# Patient Record
Sex: Male | Born: 1940 | Race: White | Hispanic: Yes | Marital: Married | Smoking: Never smoker
Health system: Southern US, Community
[De-identification: ages and names within clinical notes are randomized; demographics above are authoritative.]

## PROBLEM LIST (undated history)

## (undated) DIAGNOSIS — Z95810 Presence of automatic (implantable) cardiac defibrillator: Secondary | ICD-10-CM

## (undated) DIAGNOSIS — Z95811 Presence of heart assist device: Secondary | ICD-10-CM

## (undated) DIAGNOSIS — I255 Ischemic cardiomyopathy: Secondary | ICD-10-CM

## (undated) DIAGNOSIS — E7800 Pure hypercholesterolemia, unspecified: Secondary | ICD-10-CM

## (undated) DIAGNOSIS — Z950 Presence of cardiac pacemaker: Secondary | ICD-10-CM

## (undated) DIAGNOSIS — I509 Heart failure, unspecified: Secondary | ICD-10-CM

## (undated) DIAGNOSIS — M1990 Unspecified osteoarthritis, unspecified site: Secondary | ICD-10-CM

## (undated) DIAGNOSIS — I10 Essential (primary) hypertension: Secondary | ICD-10-CM

## (undated) DIAGNOSIS — I2119 ST elevation (STEMI) myocardial infarction involving other coronary artery of inferior wall: Secondary | ICD-10-CM

## (undated) DIAGNOSIS — E039 Hypothyroidism, unspecified: Secondary | ICD-10-CM

## (undated) DIAGNOSIS — I2510 Atherosclerotic heart disease of native coronary artery without angina pectoris: Secondary | ICD-10-CM

## (undated) DIAGNOSIS — F419 Anxiety disorder, unspecified: Secondary | ICD-10-CM

## (undated) DIAGNOSIS — I493 Ventricular premature depolarization: Secondary | ICD-10-CM

## (undated) DIAGNOSIS — K769 Liver disease, unspecified: Secondary | ICD-10-CM

## (undated) DIAGNOSIS — R0602 Shortness of breath: Secondary | ICD-10-CM

## (undated) DIAGNOSIS — I4510 Unspecified right bundle-branch block: Secondary | ICD-10-CM

## (undated) HISTORY — DX: Atherosclerotic heart disease of native coronary artery without angina pectoris: I25.10

## (undated) HISTORY — DX: Pure hypercholesterolemia, unspecified: E78.00

## (undated) HISTORY — PX: LEFT VENTRICULAR ASSIST DEVICE: SHX2679

## (undated) HISTORY — DX: Unspecified osteoarthritis, unspecified site: M19.90

## (undated) HISTORY — DX: Presence of automatic (implantable) cardiac defibrillator: Z95.810

## (undated) HISTORY — DX: Liver disease, unspecified: K76.9

## (undated) HISTORY — DX: Ischemic cardiomyopathy: I25.5

## (undated) HISTORY — PX: OTHER SURGICAL HISTORY: SHX169

---

## 2002-08-31 HISTORY — PX: CORONARY ANGIOPLASTY WITH STENT PLACEMENT: SHX49

## 2002-11-03 ENCOUNTER — Emergency Department (HOSPITAL_COMMUNITY): Admission: EM | Admit: 2002-11-03 | Discharge: 2002-11-03 | Payer: Self-pay

## 2002-11-03 ENCOUNTER — Ambulatory Visit (HOSPITAL_BASED_OUTPATIENT_CLINIC_OR_DEPARTMENT_OTHER): Admission: RE | Admit: 2002-11-03 | Discharge: 2002-11-03 | Payer: Self-pay | Admitting: Orthopedic Surgery

## 2002-11-20 ENCOUNTER — Ambulatory Visit (HOSPITAL_COMMUNITY): Admission: RE | Admit: 2002-11-20 | Discharge: 2002-11-21 | Payer: Self-pay | Admitting: Cardiology

## 2002-12-30 HISTORY — PX: KNEE ARTHROSCOPY W/ PARTIAL MEDIAL MENISCECTOMY: SHX1882

## 2003-07-10 ENCOUNTER — Ambulatory Visit (HOSPITAL_COMMUNITY): Admission: RE | Admit: 2003-07-10 | Discharge: 2003-07-10 | Payer: Self-pay | Admitting: Cardiology

## 2004-07-05 ENCOUNTER — Emergency Department (HOSPITAL_COMMUNITY): Admission: EM | Admit: 2004-07-05 | Discharge: 2004-07-05 | Payer: Self-pay | Admitting: Emergency Medicine

## 2007-09-01 HISTORY — PX: INSERT / REPLACE / REMOVE PACEMAKER: SUR710

## 2007-09-01 HISTORY — PX: OTHER SURGICAL HISTORY: SHX169

## 2007-10-10 ENCOUNTER — Ambulatory Visit: Payer: Self-pay | Admitting: Cardiology

## 2007-10-10 ENCOUNTER — Inpatient Hospital Stay (HOSPITAL_COMMUNITY): Admission: EM | Admit: 2007-10-10 | Discharge: 2007-10-14 | Payer: Self-pay | Admitting: Emergency Medicine

## 2007-10-12 ENCOUNTER — Encounter: Payer: Self-pay | Admitting: Cardiovascular Disease

## 2007-11-03 ENCOUNTER — Encounter (HOSPITAL_COMMUNITY): Admission: RE | Admit: 2007-11-03 | Discharge: 2008-02-01 | Payer: Self-pay | Admitting: Cardiology

## 2008-02-02 ENCOUNTER — Encounter (HOSPITAL_COMMUNITY): Admission: RE | Admit: 2008-02-02 | Discharge: 2008-02-10 | Payer: Self-pay | Admitting: Cardiology

## 2008-06-28 ENCOUNTER — Encounter: Admission: RE | Admit: 2008-06-28 | Discharge: 2008-06-28 | Payer: Self-pay | Admitting: Cardiology

## 2008-07-01 HISTORY — PX: CORONARY ANGIOPLASTY WITH STENT PLACEMENT: SHX49

## 2008-07-03 ENCOUNTER — Inpatient Hospital Stay (HOSPITAL_COMMUNITY): Admission: RE | Admit: 2008-07-03 | Discharge: 2008-07-04 | Payer: Self-pay | Admitting: Cardiology

## 2009-11-11 LAB — ICD DEVICE OBSERVATION

## 2010-02-07 ENCOUNTER — Encounter: Admission: RE | Admit: 2010-02-07 | Discharge: 2010-02-07 | Payer: Self-pay | Admitting: Orthopedic Surgery

## 2010-05-19 ENCOUNTER — Ambulatory Visit: Payer: Self-pay | Admitting: Cardiology

## 2010-05-19 LAB — LIPID PANEL
Cholesterol: 111 mg/dL (ref 0–200)
HDL: 45 mg/dL (ref 35–70)
LDL Cholesterol: 50 mg/dL
Triglycerides: 78 mg/dL (ref 40–160)

## 2010-05-19 LAB — BASIC METABOLIC PANEL: BUN: 21 mg/dL (ref 4–21)

## 2010-06-07 ENCOUNTER — Encounter: Payer: Self-pay | Admitting: Internal Medicine

## 2010-07-04 ENCOUNTER — Encounter (INDEPENDENT_AMBULATORY_CARE_PROVIDER_SITE_OTHER): Payer: Self-pay | Admitting: *Deleted

## 2010-09-24 ENCOUNTER — Encounter
Admission: RE | Admit: 2010-09-24 | Discharge: 2010-09-24 | Payer: Self-pay | Source: Home / Self Care | Attending: Neurosurgery | Admitting: Neurosurgery

## 2010-09-30 ENCOUNTER — Encounter: Payer: Self-pay | Admitting: Cardiology

## 2010-09-30 DIAGNOSIS — Z95810 Presence of automatic (implantable) cardiac defibrillator: Secondary | ICD-10-CM | POA: Insufficient documentation

## 2010-09-30 DIAGNOSIS — I255 Ischemic cardiomyopathy: Secondary | ICD-10-CM | POA: Insufficient documentation

## 2010-09-30 DIAGNOSIS — M1990 Unspecified osteoarthritis, unspecified site: Secondary | ICD-10-CM | POA: Insufficient documentation

## 2010-09-30 DIAGNOSIS — I2129 ST elevation (STEMI) myocardial infarction involving other sites: Secondary | ICD-10-CM | POA: Insufficient documentation

## 2010-09-30 DIAGNOSIS — I2510 Atherosclerotic heart disease of native coronary artery without angina pectoris: Secondary | ICD-10-CM | POA: Insufficient documentation

## 2010-09-30 DIAGNOSIS — E7800 Pure hypercholesterolemia, unspecified: Secondary | ICD-10-CM | POA: Insufficient documentation

## 2010-10-02 NOTE — Cardiovascular Report (Signed)
Summary: Office Visit Remote   Office Visit Remote   Imported By: Sallee Provencal 07/08/2010 15:41:10  _____________________________________________________________________  External Attachment:    Type:   Image     Comment:   External Document

## 2010-10-02 NOTE — Letter (Signed)
Summary: Remote Device Check  [place], [place]  [id number] [place]   [place], [place] [address]   Phone: [phone]  Fax: [phone]     July 04, 2010 MRN: [id number]   Johnny Oneill [place], [place]  [address]   Dear Mr. DURR,   Your remote transmission was recieved and reviewed by your physician.  All diagnostics were within normal limits for you.  _____Your next transmission is scheduled for:                       .  Please transmit at any time this day.  If you have a wireless device your transmission will be sent automatically.  _____X_Your next office visit is scheduled [id number] with Dr. Rayann Heman.                                 Sincerely,  Alma Friendly, LPN

## 2010-10-07 ENCOUNTER — Encounter (INDEPENDENT_AMBULATORY_CARE_PROVIDER_SITE_OTHER): Payer: Self-pay | Admitting: *Deleted

## 2010-10-16 NOTE — Letter (Signed)
Summary: Appointment - Reminder [place], [place]  [address] [place]   [place], [place] [address]   Phone: [phone]  Fax: [phone]     October 07, 2010 MRN: [id number]   [address] [place], [place]  [address]   Dear Mr. DORST,  [facility] records indicate that it is past time to schedule a follow-up appointment.Dr.Allred/Jordan recommended that you follow up with [place] in January. It is very important that we reach you to schedule this appointment as we are taking over all device checks for Dr. Martinique. We look forward to participating in your health care needs. Please contact us at the number listed above at your earliest convenience to schedule your appointment.  If you are unable to make an appointment at this time, give [place] a call so we can update our records.     Sincerely,   Public relations account executive

## 2010-11-12 ENCOUNTER — Encounter: Payer: Self-pay | Admitting: Internal Medicine

## 2010-11-12 ENCOUNTER — Encounter (INDEPENDENT_AMBULATORY_CARE_PROVIDER_SITE_OTHER): Payer: Medicare Other | Admitting: Internal Medicine

## 2010-11-12 DIAGNOSIS — I2589 Other forms of chronic ischemic heart disease: Secondary | ICD-10-CM

## 2010-11-12 DIAGNOSIS — I5022 Chronic systolic (congestive) heart failure: Secondary | ICD-10-CM

## 2010-11-12 DIAGNOSIS — I10 Essential (primary) hypertension: Secondary | ICD-10-CM | POA: Insufficient documentation

## 2010-11-18 NOTE — Assessment & Plan Note (Signed)
Summary: phys pacer ck/mt   Visit Type:  Initial Consult Referring Provider:  Dr Martinique Primary Provider:  Lavone Orn  CC:  ICD.  History of Present Illness: Johnny Oneill is a pleasant 70 yo WM with a h/o CAD, ischemic CM, and ICD implantation who presents today to establish care in the EP device clinic.  His ICD was implanted by Dr Leonia Reeves in 2009 for primary prevention of sudden death.  He reports doing very well since his ICD was implanted.  He denies ICD shocks.  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  He denies symptoms of palpitations, chest pain, shortness of breath, orthopnea, PND, lower extremity edema, dizziness, presyncope, syncope, or neurologic sequela. The patient is tolerating medications without difficulties and is otherwise without complaint today.   Current Medications (verified): 1)  Crestor 20 Mg Tabs (Rosuvastatin Calcium) .... Take One Tablet By Mouth Daily. 2)  Carvedilol 12.5 Mg Tabs (Carvedilol) .... Take One Tablet By Mouth Twice A Day 3)  Plavix 75 Mg Tabs (Clopidogrel Bisulfate) .... Take One Tablet By Mouth Daily 4)  Zetia 10 Mg Tabs (Ezetimibe) .... Take One Tablet By Mouth Daily. 5)  Aspirin Ec 325 Mg Tbec (Aspirin) .... Take One Tablet By Mouth Daily 6)  Synthroid 175 Mcg Tabs (Levothyroxine Sodium) .... Once Daily  Allergies (verified): No Known Drug Allergies  Past History:  Past Medical History: Reviewed history from 11/11/2010 and no changes required. CAD Myocardial Infarction Dyslipidemia Ischemic Cardiomyopathy Osteoarthritis Hypercholesterolemia  Past Surgical History: ICD placement by Dr Leonia Reeves (MDT) 2009 Cardiac Catheterization  Family History: Reviewed history from 11/11/2010 and no changes required. Father: Deceased age 50 - congestive heart failure Mother: had coronary bypass surgery at age 67  Social History: Reviewed history from 11/11/2010 and no changes required. 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  Alcohol Use - no Drug Use - no  Review of Systems       All systems are reviewed and negative except as listed in the HPI.   Vital Signs:  Patient profile:   70 year old male Height:      67 inches Weight:      170 pounds BMI:     26.72 Pulse rate:   64 / minute BP sitting:   140 / 80  (left arm)  Vitals Entered By: Margaretmary Bayley CMA (November 12, 2010 11:09 AM)  Physical Exam  General:  Well developed, well nourished, in no acute distress. Head:  normocephalic and atraumatic Eyes:  PERRLA/EOM intact; conjunctiva and lids normal. Mouth:  Teeth, gums and palate normal. Oral mucosa normal. Neck:  Neck supple, no JVD. No masses, thyromegaly or abnormal cervical nodes. Chest Wall:  ICD pocket is well healed Lungs:  Clear bilaterally to auscultation and percussion. Heart:  RRR, no m/r/g Abdomen:  Bowel sounds positive; abdomen soft and non-tender without masses, organomegaly, or hernias noted. No hepatosplenomegaly. Msk:  Back normal, normal gait. Muscle strength and tone normal. Extremities:  No clubbing or cyanosis. Neurologic:  Alert and oriented x 3. Skin:  Intact without lesions or rashes. Psych:  Normal affect.   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  I therefore repogrammed his device today to a single VF zone at 200 bpm. no other changes today  Problem # 2:  ISCHEMIC CARDIOMYOPATHY (ICD-414.8) no ischemic symptoms no changes today  Problem # 3:  ESSENTIAL HYPERTENSION, BENIGN (ICD-401.1) stable no changes

## 2010-11-21 ENCOUNTER — Encounter: Payer: Self-pay | Admitting: Cardiology

## 2010-11-21 ENCOUNTER — Ambulatory Visit (INDEPENDENT_AMBULATORY_CARE_PROVIDER_SITE_OTHER): Payer: Medicare Other | Admitting: Cardiology

## 2010-11-21 DIAGNOSIS — E7800 Pure hypercholesterolemia, unspecified: Secondary | ICD-10-CM

## 2010-11-21 DIAGNOSIS — I10 Essential (primary) hypertension: Secondary | ICD-10-CM

## 2010-11-21 DIAGNOSIS — I2510 Atherosclerotic heart disease of native coronary artery without angina pectoris: Secondary | ICD-10-CM

## 2010-11-21 DIAGNOSIS — I2589 Other forms of chronic ischemic heart disease: Secondary | ICD-10-CM

## 2010-11-21 NOTE — Assessment & Plan Note (Signed)
Continue current therapy with Coreg. Continue sodium restriction. He is intolerant of angiotensin receptor blockers and ACE inhibitors because of hypokalemia and hypotension. Last ejection fraction was 37% by nuclear study in November of 2010.

## 2010-11-21 NOTE — Assessment & Plan Note (Signed)
No significant anginal symptoms. Will continue on his current therapy with beta blockers and aspirin and Plavix. Anticipate followup stress Cardiolite study next fall.

## 2010-11-21 NOTE — Assessment & Plan Note (Signed)
Continue current antihypertensive therapy. Continue sodium restriction.

## 2010-11-21 NOTE — Assessment & Plan Note (Signed)
Lipid levels well controlled on combination of Crestor and Zetia. Continue heart healthy diet.

## 2010-11-21 NOTE — Progress Notes (Signed)
HPI Johnny Oneill  is seen today for followup of coronary disease. He has been doing very well. He did suffer a pulled gluteal muscle in January which had limited his activity for all of January. He states he also had some spinal stenosis. 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 He does note some mild shortness of breath that he has to run a lot. He reports he had an ICD check in March and that everything was good. No Known Allergies  Current Outpatient Prescriptions on File Prior to Visit  Medication Sig Dispense Refill  . aspirin 325 MG tablet Take 325 mg by mouth daily.        . carvedilol (COREG) 12.5 MG tablet Take 12.5 mg by mouth 2 (two) times daily with meals.        . clopidogrel (PLAVIX) 75 MG tablet Take 75 mg by mouth daily.        [name] ezetimibe (ZETIA) 10 MG tablet Take 10 mg by mouth daily.        [name] levothyroxine (SYNTHROID, LEVOTHROID) 175 MCG tablet Take 175 mcg by mouth daily.        . nitroGLYCERIN (NITROSTAT) 0.4 MG SL tablet Place 0.4 mg under the tongue every 5 (five) minutes as needed.        . rosuvastatin (CRESTOR) 20 MG tablet Take 20 mg by mouth daily.          Past Medical History  Diagnosis Date  . Ischemic cardiomyopathy   . CAD (coronary artery disease)   . Myocardial infarction of lateral wall   . Hypercholesteremia   . 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 CAD (coronary artery disease)   . Osteoarthritis     Past Surgical History  Procedure Date  . Coronary angioplasty   . Knee arthroscopy     X2  . Defibrillator     Family History  Problem Relation Age of Onset  . Heart failure Father   . Stroke Father   . Coronary artery disease Mother   . Heart disease Mother   . Hyperlipidemia Sister     History   Social History  . Marital Status: Married    Spouse Name: N/A    Number of Children: N/A  . Years of Education: N/A   Occupational History  . Not on file.   Social History  Main Topics  . Smoking status: Never Smoker   . Smokeless tobacco: Not on file  . Alcohol Use: Yes     occassionlly  . Drug Use: No  . Sexually Active: Not on file   Other Topics Concern  . Not on file   Social History Narrative  . No narrative on file    ROS The patient denies any heat or cold intolerance.  No weight gain or weight loss.  The patient denies headaches or blurry vision.  There is no cough or sputum production.  The patient denies dizziness.  There is no hematuria or hematochezia.  The patient denies any muscle aches or arthritis.  The patient denies any rash.  The patient denies frequent falling or instability.  There is no history of depression or anxiety.  All other systems were reviewed and are negative. PHYSICAL EXAM BP 148/90  Pulse 72  Ht 5\' 7"  (1.702 m)  Wt 170 lb 3.2 oz (77.202 kg)  BMI 26.66 kg/m2 The patient is alert and oriented x 3.  The  mood and affect are normal.  The skin is warm and dry.  Color is normal.  The HEENT exam reveals that the sclera are nonicteric.  The mucous membranes are moist.  The carotids are 2+ without bruits.  There is no thyromegaly.  There is no JVD.  The lungs are clear.  The chest wall is non tender.  The heart exam reveals a regular rate with a normal S1 and S2.  There are no murmurs, gallops, or rubs.  The PMI is not displaced.   Abdominal exam reveals good bowel sounds.  There is no guarding or rebound.  There is no hepatosplenomegaly or tenderness.  There are no masses.  Exam of the legs reveal no clubbing, cyanosis, or edema.  The legs are without rashes.  The distal pulses are intact.  Cranial nerves II - XII are intact.  Motor and sensory functions are intact.  The gait is normal. ASSESSMENT AND PLAN

## 2010-11-26 ENCOUNTER — Inpatient Hospital Stay (INDEPENDENT_AMBULATORY_CARE_PROVIDER_SITE_OTHER)
Admission: RE | Admit: 2010-11-26 | Discharge: 2010-11-26 | Disposition: A | Discharge status: Home / Self Care | Payer: Medicare Other | Source: Ambulatory Visit | Attending: Emergency Medicine | Admitting: Emergency Medicine

## 2010-11-26 DIAGNOSIS — S0990XA Unspecified injury of head, initial encounter: Secondary | ICD-10-CM

## 2010-11-26 DIAGNOSIS — S0180XA Unspecified open wound of other part of head, initial encounter: Secondary | ICD-10-CM

## 2010-11-27 NOTE — Cardiovascular Report (Signed)
Summary: Office Visit   Office Visit   Imported By: Sallee Provencal 11/21/2010 14:37:00  _____________________________________________________________________  External Attachment:    Type:   Image     Comment:   External Document

## 2010-12-03 ENCOUNTER — Inpatient Hospital Stay (INDEPENDENT_AMBULATORY_CARE_PROVIDER_SITE_OTHER)
Admission: RE | Admit: 2010-12-03 | Discharge: 2010-12-03 | Disposition: A | Discharge status: Home / Self Care | Payer: Medicare Other | Source: Ambulatory Visit

## 2010-12-03 DIAGNOSIS — Z4802 Encounter for removal of sutures: Secondary | ICD-10-CM

## 2010-12-09 ENCOUNTER — Other Ambulatory Visit: Payer: Self-pay | Admitting: Cardiology

## 2010-12-09 DIAGNOSIS — I2510 Atherosclerotic heart disease of native coronary artery without angina pectoris: Secondary | ICD-10-CM

## 2010-12-09 MED ORDER — CLOPIDOGREL BISULFATE 75 MG PO TABS: 75.0000 mg | ORAL_TABLET | Freq: Every day | ORAL | Status: DC

## 2010-12-09 NOTE — Telephone Encounter (Signed)
escribe medication per fax request  

## 2010-12-09 NOTE — Telephone Encounter (Signed)
Needs a Plavix refill. Please call the pharmacy on its direct line at: [phone]

## 2010-12-15 ENCOUNTER — Other Ambulatory Visit: Payer: Self-pay | Admitting: *Deleted

## 2010-12-15 DIAGNOSIS — I2510 Atherosclerotic heart disease of native coronary artery without angina pectoris: Secondary | ICD-10-CM

## 2010-12-15 MED ORDER — CLOPIDOGREL BISULFATE 75 MG PO TABS: 75.0000 mg | ORAL_TABLET | Freq: Every day | ORAL | Status: DC

## 2010-12-15 NOTE — Telephone Encounter (Signed)
Called for 10 day supply for Plavix. [facility] won't mail until Sat. escribed to [facility]

## 2011-01-13 NOTE — Discharge Summary (Signed)
NAME:  Johnny Oneill, Johnny Oneill NO.:  [id number]   MEDICAL RECORD NO.:  [id number]          PATIENT TYPE:  INP   LOCATION:  4702                         FACILITY:  [facility]   PHYSICIAN:  Peter M. Martinique, M.D.  DATE OF BIRTH:  29-Dec-1940   DATE OF ADMISSION:  10/10/2007  DATE OF DISCHARGE:  10/14/2007                               DISCHARGE SUMMARY   HISTORY OF PRESENT ILLNESS:  Mr. Coriell was a 70 year old white male with  known history of coronary disease.  He has chronic total occlusion of  his right coronary artery and first diagonal branches.  He underwent  intracoronary stenting of the left circumflex coronary in 2004.  He has  a history of moderate left ventricular dysfunction.  The patient  presented on the morning of admission with acute onset of severe  substernal chest pain beginning in his back and then radiating to his  anterior chest associated with nausea, vomiting and syncope at home.  EMS was called and ECG showed acute ST elevation, myocardial infarction  in inferior leads.  The patient was admitted for acute management.  For  details of his past medical history, social history, family history and  physical exam please see admission history and physical.   LABORATORY DATA:  Initial ECG showed normal sinus rhythm with a right  bundle branch block.  There is ST elevation in leads 2, 3 and aVF with  deep reciprocal ST depression in leads V1 through V4 consistent with ST  elevation myocardial infarction.  Chest x-ray showed no active disease.  Hemoglobin was 15.7, hematocrit 46.4, white count 11,200, platelets  197,000.  Sodium 132, potassium 4.1, chloride 102, CO2 25, BUN 19,  creatinine 1.26, glucose is 138, AST was 585, ALT 123, alk phos was 64,  total bili 1.1, albumin 3.5, cholesterol was 110, LDL 49, HDL 37,  triglycerides 121.  Initial CK was 7654 with an MB of 573.6.  Subsequently it has gone to 5890 with 488.5 MB and then 3976 with 350.7  MB.  Troponins  were all greater than 100.  Calcium level was normal.  TSH was 4.012, free T4 was 1.36.  Glycosylated hemoglobin was 5.7.  BNP  level was 84.   HOSPITAL COURSE:  The patient was taken emergently to the cardiac  catheterization laboratory.  He was loaded with Plavix.  Cardiac  catheterization demonstrated 30-40% proximal stenosis in the LAD.  The  left circumflex coronary was occluded proximally at the site of the  previous stent.  The right coronary was also occluded from previous  cardiac catheterizations.  The first diagonal branch was occluded.  The  left ventricular angiography showed moderate to severe left ventricular  dysfunction with ejection fraction approximately 35% with inferobasal  akinesia.  The patient subsequently underwent emergent intervention of  the left circumflex coronary.  This area was restented covering the  prior stent as well as further distally with a 3.0 x 28 mm Promus drug  eluting stent.  An excellent angiographic result was obtained.  There  was some distal embolization of clot into the distal vessel.  The  patient did have nonsustained ventricular tachycardia.  Subsequently,  however, his chest pain did resolve.  His ECG showed resolution of ST  elevation.  He continued to have right bundle branch block and he did  develop Q-waves inferiorly.  The patient was subsequently treated with  beta-blocker therapy with Coreg 6.25 mg b.i.d.  He was maintained on  aspirin and Plavix as well as statin and Zetia.  He was started on a low  dose of angiotensin receptor blocker.  The patient had previously had  intolerance to Altace due to hyperkalemia.  His potassium was watched  closely in the hospital and remained at normal level of 4.5.  He had  stable mild renal insufficiency.  The patient's hospital course was  noteworthy in that he continued to have low blood pressure with systolic  readings of [id number].  He was asymptomatic with this.  This limited  titration of  his medications.  He progressed well with cardiac rehab  without recurrent angina.  He has had no evidence of congestive heart  failure.  Echocardiogram was obtained and showed inferior and lateral  wall akinesia with an ejection fraction of 30-35%.  There was mild to  moderate mitral insufficiency.  With his continued improvement it was  felt that the patient was stable for discharge and he was discharged  home in stable condition on October 14, 2007.   DISCHARGE DIAGNOSES:  1. Acute lateral myocardial infarction.  2. Emergent stenting of the proximal to mid left circumflex coronary.  3. 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  4. Severe left ventricular dysfunction.  5. Nonsustained ventricular tachycardia, resolved.  6. Chronic renal insufficiency.  7. Hypercholesterolemia.  8. Hypothyroidism, treated.   DISCHARGE MEDICATIONS:  1. Aspirin 325 mg per day.  2. Synthroid 175 mcg per day.  3. Zetia 10 mg per day.  4. Crestor 10 mg per day.  5. Plavix 75 mg per day.  6. Diovan 80 mg 1/2 tablet daily.  7. Coreg 6.25 mg twice a day.  8. Nitroglycerin p.r.n.   FOLLOWUP:  The patient will follow up with Dr. Martinique in 2 weeks and we  will repeat a BMET at that time.  He will progress activity as outlined  by cardiac rehab.  Recommended followup with outpatient cardiac rehab  therapy.  He is not to drive or do any lifting for 2 weeks.   DISCHARGE STATUS:  Is improved.           ______________________________  Peter M. Martinique, M.D.     PMJ/MEDQ  D:  10/14/2007  T:  10/15/2007  Job:  [id number]

## 2011-01-13 NOTE — H&P (Signed)
NAME:  Johnny Oneill, Johnny Oneill NO.:  [id number]   MEDICAL RECORD NO.:  [id number]          PATIENT TYPE:  INP   LOCATION:  [id number]                         FACILITY:  [facility]   PHYSICIAN:  Tommy Medal, MD      DATE OF BIRTH:  [birth year]   DATE OF ADMISSION:  10/10/2007  DATE OF DISCHARGE:                              HISTORY & PHYSICAL   PRIMARY CARDIOLOGIST:  Peter M. Martinique, M.D.   CHIEF COMPLAINT:  Chest pain.   HISTORY OF PRESENT ILLNESS:  The patient is a 70 year old white male  with a history of coronary artery disease and previous PCI to the left  circumflex in 2004, as well as moderate left ventricular dysfunction,  who started having chest pain at about 8:30 p.m. 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  He reports the pain is  substernal and gradually worsened, and he began to have nausea and  vomiting after he returned home.  He also had a brief syncopal event, at  which point his wife called EMS.  Upon awakening, he had 10/10  substernal chest pain.  Upon arrival, EMS strips showed ST elevation  pattern consistent with acute inferior posterior ST elevation MI.  He  was given 325 mg of aspirin and transported emergently to [facility] emergency department.  He has been hemodynamically stable.   PAST MEDICAL HISTORY:  1. Coronary artery disease, status post percutaneous coronary      intervention to the left circumflex in March of 2004 with 3.0 mm x      16 mm Taxus.  Subsequent catheterization in November of 2004 showed      chronically occluded right coronary artery, left circumflex stent      patent with 50% mid circumflex lesion, ejection fraction 40%.  2. Hyperlipidemia.  3. Hypothyroidism.  4. Status post knee arthroscopies.   MEDICATIONS:  1. Aspirin 81 mg p.o. daily.  2. Toprol 25 mg p.o. daily.  3. Zetia 10 mg p.o. daily.  4. Crestor, the patient uncertain of dose.  5. Synthroid 175 mcg daily.   ALLERGIES:  LIPITOR  (myalgias).  The patient has been able to tolerate  Crestor, however.   SOCIAL HISTORY:  The patient lives in [place] and is retired.  He owns a  Copywriter, advertising.  He is a nonsmoker and drinks alcohol occasionally.  He is married.   FAMILY HISTORY:  Negative for any premature coronary artery disease.   REVIEW OF SYSTEMS:  10 systems reviewed and negative other than as noted  above in the HPI.   PHYSICAL EXAMINATION:  VITAL SIGNS:  Blood pressure 139/95, pulse 100,  oxygen saturation 98% on 2 liter nasal cannula.  GENERAL:  The patient is breathing comfortably in no apparent distress.  HEENT:  Normocephalic and atraumatic, extraocular movements intact.  Sclerae anicteric.  Oropharynx clear.  NECK:  Supple, no masses appreciated.  No carotid bruits or JVD.  CARDIOVASCULAR:  Regular rate and rhythm, normal S1 and S2.  S4 gallop  present.  1/6 holosystolic murmur at the apex.  CHEST:  Clear to auscultation bilaterally.  ABDOMEN:  Soft, nontender, and nondistended.  Normal bowel sounds.  EXTREMITIES:  Warm, no edema.  MUSCULOSKELETAL:  No joint effusions or erythema.  SKIN:  NO rashes.  NEUROLOGY:  Alert and oriented x3.  Cranial nerves II-XII grossly  intact.  Moving all extremities well.   EKG in the emergency department shows sinus rhythm at 100 beats per  minute, right bundle branch block with rightward axis, ST elevation in  the inferior leads and depression in the anterior leads consistent with  inferior posterior ST elevation MI.   LABORATORY DATA:  ISTAT; sodium 135, potassium 3.5, chloride 96, bicarb  20, BUN 6, creatinine 1.8, and glucose 70.   ASSESSMENT:  A 70 year old male with coronary artery disease with acute  infero-posterior ST elevation myocardial infarction.  The patient has  been hemodynamically stable in route as well as in his brief time in the  emergency department.   PLAN:  1. We are proceeding to the cardiac catheterization lab emergently for       planned primary PCI.  He has been loaded with 600 mg of Plavix in      the emergency department and received 325 mg of aspirin prior to      his arrival.   1. He had no difficulty tolerating Plavix in the past and has no      apparent contraindication to drug eluting stent therapy.   1. We will optimize his medical therapy with continuation of his beta      blocker, and statin, as well as consideration of an ACE inhibitor      if his blood pressure will tolerate.   1. We will check his thyroid functions.      Tommy Medal, MD  Electronically Signed     JJC/MEDQ  D:  10/10/2007  T:  10/11/2007  Job:  [id number]

## 2011-01-13 NOTE — Cardiovascular Report (Signed)
NAME:  Johnny Oneill, Johnny Oneill NO.:  [id number]   MEDICAL RECORD NO.:  [id number]          PATIENT TYPE:  INP   LOCATION:  4702                         FACILITY:  [facility]   PHYSICIAN:  Thayer Headings, M.D. DATE OF BIRTH:  1941/04/07   DATE OF PROCEDURE:  10/11/2007  DATE OF DISCHARGE:                            CARDIAC CATHETERIZATION   CARDIAC CATHETERIZATION   HISTORY:  Mr. Bogusz is a 70 year old gentleman with a history of  coronary artery disease.  He presented to the emergency room with acute  onset of chest pain, with a inferolateral ST-segment elevation.  A code  STEMI was called, and he was brought urgently to the lab.   The patient reported having chest pain now or so before presenting to  the emergency room.   The right femoral artery was easily cannulated, using the modified  Seldinger technique.   HEMODYNAMIC RESULTS:  LV pressure was 132/14 with an aortic pressure of  134/89.   ANGIOGRAPHY:  Left main.  The left main was a fairly large and normal.   The left anterior descending artery had a proximal 30% to 40% proximal  stenosis.  The remainder of the LAD has minor luminal irregularities.  There is a moderate-to-large first diagonal branch, which has minor  luminal irregularities.   The left circumflex artery is occluded proximally, at the origin of a  stent placed in 2004.   The right coronary artery was noted be occluded from previous heart  catheterizations and was not cannulated.   The left ventriculogram which was actually performed following the  angioplasty revealed moderate-to-severe left ventricular dysfunction.  The left ventricle was moderately dilated.  Ejection fraction was 35% to  40%.  There was inferior basilar akinesis.  There was lateral wall  hypokinesis.   PTCA.  The patient had been given 600 mg of Plavix loaded down in the  emergency room.  He was given Angiomax.  His ACT was 298.   We originally attempted to use a  Judkins' left 4 guide, but it did not  fit very well.  A CLS 3.5 guide fit quite nicely.  A Pro-water wire was  placed down into the distal circumflex artery.  A 3.0 x 16-mm Maverick  was placed across the stenosis and was inflated up to 6 atmospheres for  16 seconds.  This resulted in a markedly improved lumen, with the  restoration of flow.  The patient spontaneously developed ventricular  tachycardia.  He was hemodynamically stable through that.  The  ventricular tachycardia lasted for approximately 50 to 20 seconds.  We  had him cough several times, and he did not lose consciousness.  It  resolved spontaneously.  The patient was given 100 mg of lidocaine,  following that.  He did not have any further arrhythmias during the  cath.   The patient was found to have significant in-stent restenosis within the  proximal circumflex stent.  The in-stent restenosis was approximately  60% to 70% severity.  There was a significant stenosis just distal to  the stent.  There was also evidence of an  embolus in the distal aspect  of the circumflex artery.  This embolus eventually resolved with wire  manipulation throughout the remainder of case.   A 3.0 x 28-mm Promus stent was positioned across the original stent and  down distally to the next lesion.  It was deployed at 12 atmospheres for  25 seconds.  Post-stent dilatation was achieved using a 3.25 x 20-mm  Quantum monorail.  We had use a BMW buddy wire on several occasions, to  get the balloon down across the stent.  It was post-dilated at 12  atmospheres for 10 seconds distally.  It was then inflated up to 12  atmospheres for 17 seconds in the mid-segment, and then 12 atmospheres  for 10 seconds in the proximal edge of the circumflex artery.  This  resulted in a very nice angiographic result, with a 0% residual  stenosis.  There was small irregularities just distal to the stent.  These did not appear to be flow limiting and did not appear to  be due to  an edge dissection.   The patient was stable leaving the lab.  Follow-up angiogram revealed  very nice flow.   COMPLICATIONS:  None.   1. The patient did have a brief run of self-terminating ventricular      tachycardia.  2. The successful PTCA and stenting of the left circumflex artery.  3. The patient has moderate disease involving the LAD.  He has known      chronic total occlusion of the right coronary artery.  The patient      is stable, leaving the lab and will be admitted to 2300.           ______________________________  Thayer Headings, M.D.     PJN/MEDQ  D:  10/11/2007  T:  10/12/2007  Job:  [id number]   cc:   Peter M. Martinique, M.D.

## 2011-01-16 NOTE — Consult Note (Signed)
NAME:  Johnny, Oneill NO.:  [id number]   MEDICAL RECORD NO.:  [id number]                   PATIENT TYPE:  EMS   LOCATION:  MAJO                                 FACILITY:  [facility]   PHYSICIAN:  Johnny Oneill, M.D. [facility]             DATE OF BIRTH:  April 25, 1941   DATE OF CONSULTATION:  11/03/2002  DATE OF DISCHARGE:                                   CONSULTATION   CHIEF COMPLAINT:  Abnormal electrocardiogram preoperatively.   HISTORY OF PRESENT ILLNESS:  The patient is a 70 year old male with no prior  history of coronary artery disease who has been referred with an abnormal  electrocardiogram obtained prior to his knee surgery earlier today.  The  patient did proceed with knee surgery and had no further complications.  He  also denies any substernal chest pain, shortness of breath, orthopnea or  PND.  He is a very active individual and is a Conservation officer, historic buildings for high school  basketball.  He denies any substernal chest pain on any occasion.  He has  had no prior palpitations.  He did notice, some time ago, occasional extra  heartbeats, but with exercise has no palpitations.  Electrocardiogram showed  multiple PVCs and the patient was sent to the ER for further evaluation  after his knee surgery.  There is a history of brief syncopal episode  several years ago associated with sinus infection.   ALLERGIES:  No known drug allergies.   MEDICATIONS:  1. Lipitor.  2. Synthroid.  3. Celebrex.   PAST MEDICAL HISTORY:  1. No diabetes or hypertension.  2. Positive for hyperlipidemia.   FAMILY HISTORY:  Mother is alive at age 48 who had bypass surgery at age 29.  Father died of congestive heart failure.   SOCIAL HISTORY:  Negative tobacco use.  The patient lives in [place] with his  wife.  He is a Writer.  He drinks a significant amount of  caffeine and tea.   REVIEW OF SYMPTOMS:  CONSTITUTIONAL:  No fevers, chills, headache or sore  throat.   INTEGUMENTARY:  No rash or lesions.  CARDIOPULMONARY:  Negative for  chest pain or shortness of breath.  Negative for dyspnea on exertion.  GENITOURINARY:  Negative for frequency and dysuria.  MUSCULOSKELETAL:  No  weakness or numbness.  GASTROINTESTINAL:  No nausea or vomiting.   PHYSICAL EXAMINATION:  VITAL SIGNS:  Blood pressure 183/89, heart rate 92  beats per minute, temperature 97.9.  GENERAL:  Well-nourished, white male in no apparent distress.  HEENT:  NCAT.  PERRLA.  EOMI.  NECK:  Supple.  No carotid upstroke or bruits.  HEART:  Regular rate and rhythm with normal S1, S2.  LUNGS:  Clear to auscultation bilaterally.  ABDOMEN:  Soft, nontender, no rebound or guarding.  Good bowel sounds.  GENITALIA:  Deferred.  RECTAL:  Deferred.  EXTREMITIES:  No clubbing, cyanosis or edema.  NEUROLOGIC:  The patient is alert, oriented and grossly appropriate.   LABORATORY DATA AND X-RAY FINDINGS:  Chest x-ray is pending.  EKG with  normal sinus rhythm and heart rate 91.   Labs pending.  TSH pending.   IMPRESSION:  1. Premature ventricular contractions.  The patient's premature ventricular     contractions are asymptomatic.  We will rule out structural heart     disease.  Will get 2D echocardiogram and stress Cardiolite study.  We     have added Toprol XL 25 mg a day to voice underlying hypertension.  We     want to make sure the patient does not have significant hyperthyroidism.  2. History of thyroid disease.  Thyroid stimulating hormone is pending.   DISPOSITION:  The patient will follow up with Dr. Dannielle Oneill next Thursday for  further evaluation after Cardiolite and 2D echocardiogram have been  obtained.                                               Johnny Oneill, M.D. [facility]    GED/MEDQ  D:  11/03/2002  T:  11/03/2002  Job:  [id number]   cc:   Johnny Oneill, M.D.  [address]  [place]  [place] [address]  Fax: [phone]

## 2011-01-16 NOTE — Op Note (Signed)
   NAME:  Johnny Oneill, Johnny Oneill                           ACCOUNT NO.:  [id number]   MEDICAL RECORD NO.:  [id number]                   PATIENT TYPE:  AMB   LOCATION:  DSC                                  FACILITY:  [facility]   PHYSICIAN:  Yvette Rack., M.D.             DATE OF BIRTH:  September 13, 1940   DATE OF PROCEDURE:  11/03/2002  DATE OF DISCHARGE:                                 OPERATIVE REPORT   PREOPERATIVE DIAGNOSES:  1. Grade 3 chondromalacia patella femoral joint, medial compartment.  2. Complex tear posterior horn medial meniscus.  3. Grade 2 chondromalacia lateral compartment.  4. 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   POSTOPERATIVE DIAGNOSES:  1. Grade 3 chondromalacia patella femoral joint, medial compartment.  2. Complex tear posterior horn medial meniscus.  3. Grade 2 chondromalacia lateral compartment.  4. 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   OPERATION:  1. Partial medial meniscectomy (30%).  2. Extensive debridement chondroplasty patella femoral joint.  3. Debridement chondroplasty medial and lateral compartment.   SURGEON:  Lockie Pares, M.D.   ANESTHESIA:  MAC   INDICATIONS FOR PROCEDURE:  This is a 70 year old with MRI proven meniscal  tearing with osteoarthritis of the right knee thought to be amenable to  outpatient arthroscopy.   DESCRIPTION OF PROCEDURE:  Arthroscope through two portals, inferior medial  and inferior lateral.  Inspection of the knee showed patella had some small  punctate area of grade 3 chondromalacia.  The trochlear groove was actually  more involved with grade 3 chondromalacia.  It was debrided extensive.  Plica was excised.  The medial compartment showed moderately severe  osteoarthritis of the medial compartment with generalized grade 3 changes of  the majority of the weight bearing portion of the femoral condyle and at  least 50% of the posterior portion of the tibial plateau with a complex  tear  of the posterior horn of the meniscus.  Required resection of 20 to 30% of  the meniscus substance.  Anterior cruciate ligament showed chronic laxity.  No significant lateral tearing but mild soft grade 1 and 2 chondromalacia of  the lateral compartment as well.  Debridement of the medial compartment,  lateral compartment, patella femoral joint.  Medial meniscectomy was  accomplished.  The knee was drained free of fluid.  Portals closed with  Nylon.  Knee infiltrated with Marcaine, Morphine and Depo-Medrol.                                              Yvette Rack., M.D.     WDC/MEDQ  D:  11/03/2002  T:  11/03/2002  Job:  [id number]

## 2011-02-12 ENCOUNTER — Ambulatory Visit (INDEPENDENT_AMBULATORY_CARE_PROVIDER_SITE_OTHER): Payer: Medicare Other | Admitting: *Deleted

## 2011-02-12 ENCOUNTER — Other Ambulatory Visit: Payer: Self-pay | Admitting: Internal Medicine

## 2011-02-12 DIAGNOSIS — Z95810 Presence of automatic (implantable) cardiac defibrillator: Secondary | ICD-10-CM

## 2011-02-12 DIAGNOSIS — I428 Other cardiomyopathies: Secondary | ICD-10-CM

## 2011-02-12 DIAGNOSIS — I5022 Chronic systolic (congestive) heart failure: Secondary | ICD-10-CM

## 2011-02-16 NOTE — Progress Notes (Signed)
icd remote w/icm  

## 2011-02-18 ENCOUNTER — Encounter: Payer: Self-pay | Admitting: Cardiology

## 2011-03-06 ENCOUNTER — Encounter: Payer: Self-pay | Admitting: *Deleted

## 2011-03-30 ENCOUNTER — Telehealth: Payer: Self-pay | Admitting: Cardiology

## 2011-03-30 NOTE — Telephone Encounter (Signed)
Spoke w/wife. Advised to call EP lab [phone] to get advice
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
## 2011-04-15 ENCOUNTER — Telehealth: Payer: Self-pay | Admitting: Cardiology
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
## 2011-05-21 ENCOUNTER — Encounter: Payer: Medicare Other | Admitting: *Deleted

## 2011-05-22 LAB — I-STAT 8, (EC8 V) (CONVERTED LAB)
BUN: 27 — ABNORMAL HIGH
Bicarbonate: 26.7 — ABNORMAL HIGH
Chloride: 103
Glucose, Bld: 130 — ABNORMAL HIGH
HCT: 51
Hemoglobin: 17.3 — ABNORMAL HIGH
Operator id: 151321
Potassium: 4.6
Sodium: 136
TCO2: 28
pCO2, Ven: 48.9
pH, Ven: 7.346 — ABNORMAL HIGH

## 2011-05-22 LAB — CBC
HCT: 46.4
HCT: 48.2
Hemoglobin: 15.7
Hemoglobin: 15.9
MCHC: 33.1
MCHC: 33.9
MCV: 89.5
MCV: 89.7
Platelets: 197
Platelets: 233
RBC: 5.19
RBC: 5.38
RDW: 13.8
RDW: 14
WBC: 11.2 — ABNORMAL HIGH
WBC: 13.1 — ABNORMAL HIGH

## 2011-05-22 LAB — DIFFERENTIAL
Basophils Absolute: 0
Basophils Absolute: 0
Basophils Relative: 0
Basophils Relative: 0
Eosinophils Absolute: 0
Eosinophils Absolute: 0.1
Eosinophils Relative: 0
Eosinophils Relative: 1
Lymphocytes Relative: 12
Lymphocytes Relative: 15
Lymphs Abs: 1.4
Lymphs Abs: 2
Monocytes Absolute: 0.8
Monocytes Absolute: 1.1 — ABNORMAL HIGH
Monocytes Relative: 10
Monocytes Relative: 6
Neutro Abs: 10.1 — ABNORMAL HIGH
Neutro Abs: 8.6 — ABNORMAL HIGH
Neutrophils Relative %: 77
Neutrophils Relative %: 78 — ABNORMAL HIGH

## 2011-05-22 LAB — BASIC METABOLIC PANEL
BUN: 14
BUN: 24 — ABNORMAL HIGH
BUN: 25 — ABNORMAL HIGH
CO2: 25
CO2: 27
CO2: 30
Calcium: 8.8
Calcium: 8.9
Calcium: 9
Chloride: 100
Chloride: 100
Chloride: 103
Creatinine, Ser: 1.31
Creatinine, Ser: 1.36
Creatinine, Ser: 1.51 — ABNORMAL HIGH
GFR calc Af Amer: 57 — ABNORMAL LOW
GFR calc Af Amer: >60
GFR calc Af Amer: >60
GFR calc non Af Amer: 47 — ABNORMAL LOW
GFR calc non Af Amer: 53 — ABNORMAL LOW
GFR calc non Af Amer: 55 — ABNORMAL LOW
Glucose, Bld: 101 — ABNORMAL HIGH
Glucose, Bld: 105 — ABNORMAL HIGH
Glucose, Bld: 124 — ABNORMAL HIGH
Potassium: 4.3
Potassium: 4.5
Potassium: 4.5
Sodium: 134 — ABNORMAL LOW
Sodium: 134 — ABNORMAL LOW
Sodium: 136

## 2011-05-22 LAB — B-NATRIURETIC PEPTIDE (CONVERTED LAB): Pro B Natriuretic peptide (BNP): 84

## 2011-05-22 LAB — COMPREHENSIVE METABOLIC PANEL
ALT: 123 — ABNORMAL HIGH
AST: 585 — ABNORMAL HIGH
Albumin: 3.5
Alkaline Phosphatase: 64
BUN: 19
CO2: 25
Calcium: 8.6
Chloride: 102
Creatinine, Ser: 1.26
GFR calc Af Amer: >60
GFR calc non Af Amer: 58 — ABNORMAL LOW
Glucose, Bld: 138 — ABNORMAL HIGH
Potassium: 4.1
Sodium: 132 — ABNORMAL LOW
Total Bilirubin: 1.1
Total Protein: 6

## 2011-05-22 LAB — MAGNESIUM: Magnesium: 2.2

## 2011-05-22 LAB — POCT CARDIAC MARKERS
CKMB, poc: 2.1
Myoglobin, poc: 305
Operator id: 151321
Troponin i, poc: 0.08 — ABNORMAL HIGH

## 2011-05-22 LAB — LIPID PANEL
Cholesterol: 110
Cholesterol: 120
HDL: 37 — ABNORMAL LOW
HDL: 39 — ABNORMAL LOW
LDL Cholesterol: 46
LDL Cholesterol: 49
Total CHOL/HDL Ratio: 3
Total CHOL/HDL Ratio: 3.1
Triglycerides: 121
Triglycerides: 173 — ABNORMAL HIGH
VLDL: 24
VLDL: 35

## 2011-05-22 LAB — PROTIME-INR
INR: 1
Prothrombin Time: 12.9

## 2011-05-22 LAB — T4, FREE: Free T4: 1.36

## 2011-05-22 LAB — CARDIAC PANEL(CRET KIN+CKTOT+MB+TROPI)
CK, MB: 350.7 — ABNORMAL HIGH
CK, MB: 488.5 — ABNORMAL HIGH
CK, MB: 573.6 — ABNORMAL HIGH
Relative Index: 7.5 — ABNORMAL HIGH
Relative Index: 8.3 — ABNORMAL HIGH
Relative Index: 8.8 — ABNORMAL HIGH
Total CK: 3976 — ABNORMAL HIGH
Total CK: 5890 — ABNORMAL HIGH
Total CK: 7654 — ABNORMAL HIGH
Troponin I: >100
Troponin I: >100
Troponin I: >100

## 2011-05-22 LAB — TSH
TSH: 4.012
TSH: 9.937 — ABNORMAL HIGH

## 2011-05-22 LAB — POCT I-STAT CREATININE
Creatinine, Ser: 1.4
Creatinine, Ser: 1.8 — ABNORMAL HIGH
Operator id: 151321
Operator id: 298401

## 2011-05-22 LAB — HEMOGLOBIN A1C
Hgb A1c MFr Bld: 5.7
Mean Plasma Glucose: 126

## 2011-05-24 ENCOUNTER — Encounter: Payer: Self-pay | Admitting: *Deleted

## 2011-06-01 ENCOUNTER — Ambulatory Visit (INDEPENDENT_AMBULATORY_CARE_PROVIDER_SITE_OTHER): Payer: Medicare Other | Admitting: *Deleted

## 2011-06-01 DIAGNOSIS — E7800 Pure hypercholesterolemia, unspecified: Secondary | ICD-10-CM

## 2011-06-01 DIAGNOSIS — I2510 Atherosclerotic heart disease of native coronary artery without angina pectoris: Secondary | ICD-10-CM

## 2011-06-01 LAB — HEPATIC FUNCTION PANEL
ALT: 24 U/L (ref 0–53)
AST: 23 U/L (ref 0–37)
Albumin: 4.2 g/dL (ref 3.5–5.2)
Alkaline Phosphatase: 69 U/L (ref 39–117)
Bilirubin, Direct: 0.1 mg/dL (ref 0.0–0.3)
Total Bilirubin: 0.9 mg/dL (ref 0.3–1.2)
Total Protein: 7.2 g/dL (ref 6.0–8.3)

## 2011-06-01 LAB — BASIC METABOLIC PANEL
BUN: 24 mg/dL — ABNORMAL HIGH (ref 6–23)
CO2: 26 mEq/L (ref 19–32)
Calcium: 8.9 mg/dL (ref 8.4–10.5)
Chloride: 104 mEq/L (ref 96–112)
Creatinine, Ser: 1.3 mg/dL (ref 0.4–1.5)
GFR: 56.87 mL/min — ABNORMAL LOW (ref >60.00)
Glucose, Bld: 115 mg/dL — ABNORMAL HIGH (ref 70–99)
Potassium: 5 mEq/L (ref 3.5–5.1)
Sodium: 138 mEq/L (ref 135–145)

## 2011-06-01 LAB — LIPID PANEL
Cholesterol: 99 mg/dL (ref 0–200)
HDL: 44.5 mg/dL (ref >39.00)
LDL Cholesterol: 39 mg/dL (ref 0–99)
Total CHOL/HDL Ratio: 2
Triglycerides: 78 mg/dL (ref 0.0–149.0)
VLDL: 15.6 mg/dL (ref 0.0–40.0)

## 2011-06-03 ENCOUNTER — Ambulatory Visit (INDEPENDENT_AMBULATORY_CARE_PROVIDER_SITE_OTHER): Payer: Medicare Other | Admitting: Cardiology

## 2011-06-03 ENCOUNTER — Telehealth: Payer: Self-pay | Admitting: *Deleted

## 2011-06-03 ENCOUNTER — Encounter: Payer: Self-pay | Admitting: Cardiology

## 2011-06-03 VITALS — BP 118/62 | HR 78 | Ht 67.0 in | Wt 170.1 lb

## 2011-06-03 DIAGNOSIS — I2510 Atherosclerotic heart disease of native coronary artery without angina pectoris: Secondary | ICD-10-CM

## 2011-06-03 DIAGNOSIS — I255 Ischemic cardiomyopathy: Secondary | ICD-10-CM

## 2011-06-03 DIAGNOSIS — I5022 Chronic systolic (congestive) heart failure: Secondary | ICD-10-CM

## 2011-06-03 DIAGNOSIS — E785 Hyperlipidemia, unspecified: Secondary | ICD-10-CM

## 2011-06-03 DIAGNOSIS — I2589 Other forms of chronic ischemic heart disease: Secondary | ICD-10-CM

## 2011-06-03 DIAGNOSIS — E7800 Pure hypercholesterolemia, unspecified: Secondary | ICD-10-CM

## 2011-06-03 NOTE — Progress Notes (Signed)
HPI [name]  is seen today for followup of coronary disease. He had remote stenting of the left circumflex coronary in 2004. In February 2009 he suffered a lateral myocardial infarction with occlusion of this vessel. He was emergently stented with a 3.0 x 28 mm Primus stent. 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 As a result he has an ischemic cardiomyopathy. He is status post ICD implant.  He has been doing very well. He denies any chest pain or shortness of breath. He's had no increase in edema. He denies any palpitations, dizziness, or ICD discharges. He remains active. He has made significant changes in his diet. Allergies  Allergen Reactions  . Diovan (Valsartan) Other (See Comments)    Hypotension at low dose, hyperkalemia    Current Outpatient Prescriptions on File Prior to Visit  Medication Sig Dispense Refill  . aspirin 325 MG tablet Take 325 mg by mouth daily.        . carvedilol (COREG) 12.5 MG tablet Take 12.5 mg by mouth 2 (two) times daily with meals.        . clopidogrel (PLAVIX) 75 MG tablet Take 1 tablet (75 mg total) by mouth daily.  10 tablet  3  . ezetimibe (ZETIA) 10 MG tablet Take 10 mg by mouth daily.        [name] levothyroxine (SYNTHROID, LEVOTHROID) 175 MCG tablet Take 175 mcg by mouth daily.        . nitroGLYCERIN (NITROSTAT) 0.4 MG SL tablet Place 0.4 mg under the tongue every 5 (five) minutes as needed.        . rosuvastatin (CRESTOR) 20 MG tablet Take 20 mg by mouth daily.          Past Medical History  Diagnosis Date  . Ischemic cardiomyopathy   . CAD (coronary artery disease)   . Myocardial infarction of lateral wall   . Hypercholesteremia   . 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 CAD (coronary artery disease)   . Osteoarthritis     Past Surgical History  Procedure Date  . Coronary angioplasty   . Knee arthroscopy     X2  . Defibrillator     Family History  Problem Relation Age of Onset  . Heart  failure Father   . Stroke Father   . Coronary artery disease Mother   . Heart disease Mother   . Hyperlipidemia Sister     History   Social History  . Marital Status: Married    Spouse Name: N/A    Number of Children: 1  . Years of Education: N/A   Occupational History  . referee   . cleaning company    Social History Main Topics  . Smoking status: Never Smoker   . Smokeless tobacco: Not on file  . Alcohol Use: Yes     occassionlly  . Drug Use: No  . Sexually Active: Not on file   Other Topics Concern  . Not on file   Social History Narrative  . No narrative on file    ROS  as noted in history of present illness. All other systems were reviewed and are negative. PHYSICAL EXAM BP 118/62  Pulse 78  Ht 5\' 7"  (1.702 m)  Wt 170 lb 1.9 oz (77.166 kg)  BMI 26.64 kg/m2 The patient is alert and oriented x 3.  The mood and affect are normal.  The skin is warm and dry.  Color is normal.  The HEENT exam reveals that the sclera are nonicteric.  The mucous membranes are moist.  The carotids are 2+ without bruits.  There is no thyromegaly.  There is no JVD.  The lungs are clear.  The chest wall is non tender.  The heart exam reveals a regular rate with a normal S1 and S2.  There are no murmurs, gallops, or rubs.  The PMI is not displaced.   Abdominal exam reveals good bowel sounds.  There is no guarding or rebound.  There is no hepatosplenomegaly or tenderness.  There are no masses.  Exam of the legs reveal no clubbing, cyanosis, or edema.  The legs are without rashes.  The distal pulses are intact.  Cranial nerves II - XII are intact.  Motor and sensory functions are intact.  The gait is normal. ASSESSMENT AND PLAN

## 2011-06-03 NOTE — Assessment & Plan Note (Signed)
His last nuclear stress test in November of 2010 showed a large infarct in the inferior and lateral wall. There was no ischemia. We will schedule him for a followup stress test this year.

## 2011-06-03 NOTE — Telephone Encounter (Signed)
Message copied by Hetty Blend on Wed Jun 03, 2011 10:21 AM ------      Message from: Johnny Oneill, PETER M      Created: Mon Jun 01, 2011  5:06 PM       Very, very good! Chemistries normal, lipids look great.      Collier Salina Johnny Oneill

## 2011-06-03 NOTE — Patient Instructions (Addendum)
Continue your current medications.   Your labs look great. Keep up with your diet.   We will schedule you for a nuclear stress test this year.  I will see you again in 6 months.  Your physician has requested that you have en exercise stress myoview. Please follow instruction sheet, as given.

## 2011-06-03 NOTE — Assessment & Plan Note (Signed)
Ejection fraction of 37%. He is well compensated on exam. He is on appropriate medical therapy. He is status post ICD implant. He will continue with sodium restriction and his current medications.

## 2011-06-03 NOTE — Telephone Encounter (Signed)
Notified at office visit 06/03/11

## 2011-06-03 NOTE — Assessment & Plan Note (Signed)
The parameters checked this week were excellent. He will continue with his current therapy and dietary modification.

## 2011-06-04 ENCOUNTER — Encounter: Payer: Self-pay | Admitting: Cardiology

## 2011-06-05 ENCOUNTER — Encounter: Payer: Self-pay | Admitting: Cardiology

## 2011-07-15 ENCOUNTER — Ambulatory Visit (HOSPITAL_COMMUNITY): Payer: Medicare Other | Attending: Cardiology | Admitting: Radiology

## 2011-07-15 VITALS — Ht 67.0 in | Wt 171.0 lb

## 2011-07-15 DIAGNOSIS — E785 Hyperlipidemia, unspecified: Secondary | ICD-10-CM | POA: Insufficient documentation

## 2011-07-15 DIAGNOSIS — Z8249 Family history of ischemic heart disease and other diseases of the circulatory system: Secondary | ICD-10-CM | POA: Insufficient documentation

## 2011-07-15 DIAGNOSIS — I2510 Atherosclerotic heart disease of native coronary artery without angina pectoris: Secondary | ICD-10-CM | POA: Insufficient documentation

## 2011-07-15 MED ORDER — TECHNETIUM TC 99M TETROFOSMIN IV KIT
33.0000 | PACK | Freq: Once | INTRAVENOUS | Status: AC | PRN
  Administered 2011-07-15: 33 via INTRAVENOUS

## 2011-07-15 MED ORDER — TECHNETIUM TC 99M TETROFOSMIN IV KIT
11.0000 | PACK | Freq: Once | INTRAVENOUS | Status: AC | PRN
  Administered 2011-07-15: 11 via INTRAVENOUS

## 2011-07-15 NOTE — Progress Notes (Signed)
Johnny Oneill [phone]  Cardiology Nuclear Med Study  DEARL Oneill is a 70 y.o. male [id number] 16-Aug-1941   Nuclear Med Background Indication for Stress Test:  Evaluation for Ischemia and PTCA/Stent Patency History:  2/09 ILSTEMI>PTCA/Stent-(In-Stent)LCFX with residual CAD, EF=35-40%; 10/09 Echo:EF=30-35%; 11/10 [id number] infarct, no ischemia, EF=37%; 11/09 AICD Cardiac Risk Factors: Family History - CAD and Lipids  Symptoms:  No cardiac complaints.   Nuclear Pre-Procedure Caffeine/Decaff Intake:  None NPO After: 7:00pm   Lungs:  Clear. IV 0.9% NS with Angio Cath:  20g  IV Site: R Hand  IV Started by:  Crissie Figures, RN  Chest Size (in):  44 Cup Size: n/a  Height: 5\' 7"  (1.702 m)  Weight:  171 lb (77.565 kg)  BMI:  Body mass index is 26.78 kg/(m^2). Tech Comments:  Carvedilol held x 24 hours    Nuclear Med Study 1 or 2 day study: 1 day  Stress Test Type:  Stress  Reading MD: Dr. Kirk Ruths Order Authorizing Provider:  Peter Martinique, MD  Resting Radionuclide: Technetium 29m Tetrofosmin  Resting Radionuclide Dose: 11 mCi   Stress Radionuclide:  Technetium 72m Tetrofosmin  Stress Radionuclide Dose: 33 mCi           Stress Protocol Rest HR: 63 Stress HR: 162  Rest BP: Sitting:126/84  Standing:145/91 Stress BP: 192/91  Exercise Time (min): 7:45 METS: 9.1   Predicted Max HR: 153 bpm % Max HR: 105.88 bpm Rate Pressure Product: 31104   Dose of Adenosine (mg):  n/a Dose of Lexiscan: n/a mg  Dose of Atropine (mg): n/a Dose of Dobutamine: n/a mcg/kg/min (at max HR)  Stress Test Technologist: Letta Moynahan, CMA-N  Nuclear Technologist:  Charlton Amor, CNMT     Rest Procedure:  Myocardial perfusion imaging was performed at rest 45 minutes following the intravenous administration of Technetium 57m Tetrofosmin.  Rest ECG: Atrial pacing, RBBB  Stress Procedure:  The patient exercised  for 7:45 on the treadmill utilizing the Bruce protocol with an AICD, that was not turned off.  The patient stopped due to fatigue and heart rate close to threshold for ICD.   He denied any chest pain.  There were no diagnostic ST-T wave changes, but there were frequent PVC's with couplets in recovery.  Technetium 70m Tetrofosmin was injected at peak exercise and myocardial perfusion imaging was performed after a brief delay.  Stress ECG: No significant ST segment change suggestive of ischemia.  QPS Raw Data Images:  Acquisition technically good; Severe LVE. Stress Images:  There is decreased uptake in the inferolateral wall. Rest Images:  There is decreased uptake in the inferolateral wall, slightly less prominent compared to the stress images. Subtraction (SDS):  These findings are consistent with large prior inferolateral infarct with minimal peri-infarct ischemia. Transient Ischemic Dilatation (Normal <1.22):  .97 Lung/Heart Ratio (Normal <0.45):  .31  Quantitative Gated Spect Images QGS EDV:  209 ml QGS ESV:  149 ml QGS cine images:  Akinesis of the inferolateral wall. QGS EF: 28%  Impression Exercise Capacity:  Fair exercise capacity. BP Response:  Normal blood pressure response. Clinical Symptoms:  No chest pain. ECG Impression:  No significant ST segment change suggestive of ischemia. Comparison with Prior Nuclear Study: No images to compare  Overall Impression:  Abnormal stress nuclear study with a large predominantly fixed inferolateral defect (minimal reversibility) suggestive of prior infarct with minimal peri-infarct ischemia.   Kirk Ruths

## 2011-07-24 ENCOUNTER — Telehealth: Payer: Self-pay | Admitting: Cardiology

## 2011-07-24 NOTE — Telephone Encounter (Signed)
New problem Pt's wife wanted to know stress test results

## 2011-07-24 NOTE — Telephone Encounter (Signed)
Pt doesn't remember results.  They were repeated to his wife per his request.

## 2011-09-09 ENCOUNTER — Telehealth: Payer: Self-pay | Admitting: Cardiology

## 2011-09-09 DIAGNOSIS — I2510 Atherosclerotic heart disease of native coronary artery without angina pectoris: Secondary | ICD-10-CM

## 2011-09-09 NOTE — Telephone Encounter (Signed)
New Problem:    Patient's wife called because her husband would like an OV with Dr. Martinique to see if it would be ok for him to have an knee replacement surgery.  Also would like to change her husbands pharmacy to [facility] (because he switched to [facility] for his prescriptions) and will be faxing the appropriate forms into the office for the change. Please call back.

## 2011-09-10 MED ORDER — ROSUVASTATIN CALCIUM 20 MG PO TABS: 20.0000 mg | ORAL_TABLET | Freq: Every day | ORAL | Status: DC

## 2011-09-10 MED ORDER — EZETIMIBE 10 MG PO TABS: 10.0000 mg | ORAL_TABLET | Freq: Every day | ORAL | Status: DC

## 2011-09-10 MED ORDER — CARVEDILOL 12.5 MG PO TABS: 12.5000 mg | ORAL_TABLET | Freq: Two times a day (BID) | ORAL | Status: DC

## 2011-09-10 MED ORDER — CLOPIDOGREL BISULFATE 75 MG PO TABS: 75.0000 mg | ORAL_TABLET | Freq: Every day | ORAL | Status: DC

## 2011-09-10 NOTE — Telephone Encounter (Signed)
Patient's wife called, stating patient needs appointment with Dr.Jordan,for surgical clearance lf knee replacement.Appointment scheduled 10/07/11 at 11:00 am.Also stating changed pharmacies to [facility].

## 2011-09-10 NOTE — Telephone Encounter (Signed)
Fu call Pt's wife returning your call

## 2011-09-17 ENCOUNTER — Telehealth: Payer: Self-pay | Admitting: Cardiology

## 2011-09-17 NOTE — Telephone Encounter (Signed)
Crestor 20 mg/14 sample tablets placed at the front desk, patient's wife aware.

## 2011-09-17 NOTE — Telephone Encounter (Signed)
New msg Pt's wife called and said he would like to get 2 wk supply of crestor until he gets mailorder from [facility] Please call to [facility]

## 2011-09-21 ENCOUNTER — Telehealth: Payer: Self-pay | Admitting: Cardiology

## 2011-09-21 NOTE — Telephone Encounter (Signed)
New problem Pt said he is totally out of clopidogrel please call in enough to [facility] on [address] or he wants samples if possible. He hasnt received from [facility] yet

## 2011-09-21 NOTE — Telephone Encounter (Signed)
Patient wife was called,samples of crestor 20 mg,and plavix 75 mg samples left at front desk 3rd floor.

## 2011-10-07 ENCOUNTER — Ambulatory Visit (INDEPENDENT_AMBULATORY_CARE_PROVIDER_SITE_OTHER): Payer: Medicare Other | Admitting: Cardiology

## 2011-10-07 ENCOUNTER — Encounter: Payer: Self-pay | Admitting: Cardiology

## 2011-10-07 VITALS — BP 110/76 | HR 70 | Ht 67.0 in | Wt 170.0 lb

## 2011-10-07 DIAGNOSIS — E785 Hyperlipidemia, unspecified: Secondary | ICD-10-CM

## 2011-10-07 DIAGNOSIS — I252 Old myocardial infarction: Secondary | ICD-10-CM

## 2011-10-07 DIAGNOSIS — I2510 Atherosclerotic heart disease of native coronary artery without angina pectoris: Secondary | ICD-10-CM

## 2011-10-07 DIAGNOSIS — E7800 Pure hypercholesterolemia, unspecified: Secondary | ICD-10-CM

## 2011-10-07 DIAGNOSIS — I509 Heart failure, unspecified: Secondary | ICD-10-CM

## 2011-10-07 DIAGNOSIS — I5022 Chronic systolic (congestive) heart failure: Secondary | ICD-10-CM

## 2011-10-07 NOTE — Progress Notes (Signed)
HPI Johnny Oneill  is seen today for followup of coronary disease. He had remote stenting of the left circumflex coronary in 2004. In February 2009 he suffered a lateral myocardial infarction with occlusion of this vessel. He was emergently stented with a 3.0 x 28 mm Primus stent. 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 As a result he has an ischemic cardiomyopathy. He is status post ICD implant.  He has been doing very well. He denies any chest pain. He is still refereeing basketball games. In the third quarter he sometimes gets short of breath but it resolves and he is able to go back to running. He's had no increase in edema. He denies any palpitations, dizziness, or ICD discharges. He remains active. He is having a lot of problems with his right knee. He is considering having a knee replacement operation.  Allergies  Allergen Reactions  . Diovan (Valsartan) Other (See Comments)    Hypotension at low dose, hyperkalemia    Current Outpatient Prescriptions on File Prior to Visit  Medication Sig Dispense Refill  . aspirin 325 MG tablet Take 325 mg by mouth daily.        . carvedilol (COREG) 12.5 MG tablet Take 1 tablet (12.5 mg total) by mouth 2 (two) times daily with a meal.  180 tablet  3  . clopidogrel (PLAVIX) 75 MG tablet Take 1 tablet (75 mg total) by mouth daily.  90 tablet  3  . ezetimibe (ZETIA) 10 MG tablet Take 1 tablet (10 mg total) by mouth daily.  90 tablet  3  . levothyroxine (SYNTHROID, LEVOTHROID) 175 MCG tablet Take 175 mcg by mouth daily.        . nitroGLYCERIN (NITROSTAT) 0.4 MG SL tablet Place 0.4 mg under the tongue every 5 (five) minutes as needed.        . rosuvastatin (CRESTOR) 20 MG tablet Take 1 tablet (20 mg total) by mouth daily.  90 tablet  3    Past Medical History  Diagnosis Date  . Ischemic cardiomyopathy   . CAD (coronary artery disease)   . Myocardial infarction of lateral wall   . Hypercholesteremia   . ICD (implantable cardiac defibrillator)  in place     PROPHYLACTIC  . CAD (coronary artery disease)   . Osteoarthritis     Past Surgical History  Procedure Date  . Coronary angioplasty   . Knee arthroscopy     X2  . Defibrillator     Family History  Problem Relation Age of Onset  . Heart failure Father   . Stroke Father   . Coronary artery disease Mother   . Heart disease Mother   . Hyperlipidemia Sister     History   Social History  . Marital Status: Married    Spouse Name: N/A    Number of Children: 1  . Years of Education: N/A   Occupational History  . referee   . cleaning company    Social History Main Topics  . Smoking status: Never Smoker   . Smokeless tobacco: Not on file  . Alcohol Use: Yes     occassionlly  . Drug Use: No  . Sexually Active: Not on file   Other Topics Concern  . Not on file   Social History Narrative  . No narrative on file    ROS  as noted in history of present illness. All other systems were reviewed and are negative. PHYSICAL EXAM BP 110/76  Pulse 70  Ht  5\' 7"  (1.702 m)  Wt 170 lb (77.111 kg)  BMI 26.63 kg/m2 The patient is alert and oriented x 3.  The mood and affect are normal.  The skin is warm and dry.  Color is normal.  The HEENT exam reveals that the sclera are nonicteric.  The mucous membranes are moist.  The carotids are 2+ without bruits.  There is no thyromegaly.  There is no JVD.  The lungs are clear.  The chest wall is non tender.  The heart exam reveals a regular rate with a normal S1 and S2.  There are no murmurs, gallops, or rubs.  The PMI is not displaced.   Abdominal exam reveals good bowel sounds.  There is no guarding or rebound.  There is no hepatosplenomegaly or tenderness.  There are no masses.  Exam of the legs reveal no clubbing, cyanosis, or edema.  The legs are without rashes.  The distal pulses are intact.  Cranial nerves II - XII are intact.  Motor and sensory functions are intact.  The gait is normal.  Laboratory data: ECG today  demonstrates normal sinus rhythm with biventricular pacing.  ASSESSMENT AND PLAN

## 2011-10-07 NOTE — Assessment & Plan Note (Signed)
His last myocardial event was in 2009. He is symptomatically doing very well. His recent nuclear stress test in November showed a large inferolateral scar without ischemia. Ejection fraction was 37%. We will continue with aspirin, Plavix, and beta blocker therapy. He'll remain on statin therapy. He is cleared for total knee replacement. I recommended that he stop his Plavix 5 days prior to surgery but he should remain on aspirin throughout.

## 2011-10-07 NOTE — Patient Instructions (Signed)
Continue your current medications.  If you have knee surgery stop Plavix 5 days before and stay on ASA.  I will see you in 6 months.

## 2011-10-07 NOTE — Assessment & Plan Note (Signed)
He is well compensated on exam today. Continue with his current dose of carvedilol. He is intolerant to ACE inhibitors and angiotensin receptor blockers. He has no evidence of volume overload. He is status post biventricular ICD.

## 2011-10-07 NOTE — Assessment & Plan Note (Signed)
Lipid parameters 4 months ago were excellent. We will continue with his Crestor therapy. He is also on Zetia.

## 2011-10-30 ENCOUNTER — Encounter: Payer: Self-pay | Admitting: Internal Medicine

## 2011-10-30 ENCOUNTER — Ambulatory Visit (INDEPENDENT_AMBULATORY_CARE_PROVIDER_SITE_OTHER): Payer: Medicare Other | Admitting: Internal Medicine

## 2011-10-30 DIAGNOSIS — I5022 Chronic systolic (congestive) heart failure: Secondary | ICD-10-CM

## 2011-10-30 DIAGNOSIS — I2589 Other forms of chronic ischemic heart disease: Secondary | ICD-10-CM

## 2011-10-30 DIAGNOSIS — I255 Ischemic cardiomyopathy: Secondary | ICD-10-CM

## 2011-10-30 DIAGNOSIS — Z95810 Presence of automatic (implantable) cardiac defibrillator: Secondary | ICD-10-CM

## 2011-10-30 LAB — ICD DEVICE OBSERVATION
AL AMPLITUDE: 2.3 mv
AL IMPEDENCE ICD: 448 Ohm
AL THRESHOLD: 0.5 V
ATRIAL PACING ICD: 22.79 pct
BAMS-0001: 170 {beats}/min
BATTERY VOLTAGE: 3.09 V
CHARGE TIME: 10.089 s
DEV-0020ICD: NEGATIVE
FVT: 0
PACEART VT: 0
RV LEAD AMPLITUDE: 6.3232 mv
RV LEAD IMPEDENCE ICD: 408 Ohm
RV LEAD THRESHOLD: 1.5 V
TOT-0001: 1
TOT-0002: 0
TOT-0006: 20091103000000
TZAT-0001ATACH: 1
TZAT-0001ATACH: 2
TZAT-0001ATACH: 3
TZAT-0001FASTVT: 1
TZAT-0001SLOWVT: 1
TZAT-0001SLOWVT: 2
TZAT-0002ATACH: NEGATIVE
TZAT-0002ATACH: NEGATIVE
TZAT-0002ATACH: NEGATIVE
TZAT-0002FASTVT: NEGATIVE
TZAT-0004SLOWVT: 8
TZAT-0004SLOWVT: 8
TZAT-0005SLOWVT: 88 pct
TZAT-0005SLOWVT: 91 pct
TZAT-0011SLOWVT: 10 ms
TZAT-0011SLOWVT: 10 ms
TZAT-0012ATACH: 150 ms
TZAT-0012ATACH: 150 ms
TZAT-0012ATACH: 150 ms
TZAT-0012FASTVT: 200 ms
TZAT-0012SLOWVT: 200 ms
TZAT-0012SLOWVT: 200 ms
TZAT-0013SLOWVT: 2
TZAT-0013SLOWVT: 2
TZAT-0018ATACH: NEGATIVE
TZAT-0018ATACH: NEGATIVE
TZAT-0018ATACH: NEGATIVE
TZAT-0018FASTVT: NEGATIVE
TZAT-0018SLOWVT: NEGATIVE
TZAT-0018SLOWVT: NEGATIVE
TZAT-0019ATACH: 6 V
TZAT-0019ATACH: 6 V
TZAT-0019ATACH: 6 V
TZAT-0019FASTVT: 8 V
TZAT-0019SLOWVT: 8 V
TZAT-0019SLOWVT: 8 V
TZAT-0020ATACH: 1.5 ms
TZAT-0020ATACH: 1.5 ms
TZAT-0020ATACH: 1.5 ms
TZAT-0020FASTVT: 1.5 ms
TZAT-0020SLOWVT: 1.5 ms
TZAT-0020SLOWVT: 1.5 ms
TZON-0003ATACH: 350 ms
TZON-0003SLOWVT: 350 ms
TZON-0003VSLOWVT: 350 ms
TZON-0004SLOWVT: 40
TZON-0004VSLOWVT: 44
TZON-0005SLOWVT: 12
TZST-0001ATACH: 4
TZST-0001ATACH: 5
TZST-0001ATACH: 6
TZST-0001FASTVT: 2
TZST-0001FASTVT: 3
TZST-0001FASTVT: 4
TZST-0001FASTVT: 5
TZST-0001FASTVT: 6
TZST-0001SLOWVT: 3
TZST-0001SLOWVT: 4
TZST-0001SLOWVT: 5
TZST-0001SLOWVT: 6
TZST-0002ATACH: NEGATIVE
TZST-0002ATACH: NEGATIVE
TZST-0002ATACH: NEGATIVE
TZST-0002FASTVT: NEGATIVE
TZST-0002FASTVT: NEGATIVE
TZST-0002FASTVT: NEGATIVE
TZST-0002FASTVT: NEGATIVE
TZST-0002FASTVT: NEGATIVE
TZST-0003SLOWVT: 20 J
TZST-0003SLOWVT: 25 J
TZST-0003SLOWVT: 35 J
TZST-0003SLOWVT: 35 J
VENTRICULAR PACING ICD: 0.11 pct
VF: 0

## 2011-10-30 NOTE — Patient Instructions (Signed)
Remote monitoring is used to monitor your Pacemaker of ICD from home. This monitoring reduces the number of office visits required to check your device to one time per year. It allows [place] to keep an eye on the functioning of your device to ensure it is working properly. You are scheduled for a device check from home on 01/28/11. You may send your transmission at any time that day. If you have a wireless device, the transmission will be sent automatically. After your physician reviews your transmission, you will receive a postcard with your next transmission date.  Your physician wants you to follow-up in: 12 months with Dr. Rayann Heman. You will receive a reminder letter in the mail two months in advance. If you don't receive a letter, please call our office to schedule the follow-up appointment.

## 2011-10-30 NOTE — Assessment & Plan Note (Signed)
Normal ICD function See Johnny Oneill Art report NID increased to 30/40 to prevent inappropriate shocks  carelink every 3 months Return in 1 year

## 2011-10-30 NOTE — Progress Notes (Signed)
PCP:  Pauline Good, MD, MD Primary Cardiologist:  Dr Martinique  The patient presents today for routine electrophysiology followup.  Since last being seen in our clinic, the patient reports doing very well.  He remains active.  Today, he denies symptoms of palpitations, chest pain, shortness of breath, orthopnea, PND, lower extremity edema, dizziness, presyncope, syncope, or neurologic sequela.  The patient feels that he is tolerating medications without difficulties and is otherwise without complaint today.   Past Medical History  Diagnosis Date  . Ischemic cardiomyopathy   . CAD (coronary artery disease)   . Myocardial infarction of lateral wall   . Hypercholesteremia   . 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 CAD (coronary artery disease)   . Osteoarthritis    Past Surgical History  Procedure Date  . Coronary angioplasty   . Knee arthroscopy     X2  . Defibrillator     Current Outpatient Prescriptions  Medication Sig Dispense Refill  . aspirin 325 MG tablet Take 325 mg by mouth daily.        . carvedilol (COREG) 12.5 MG tablet Take 1 tablet (12.5 mg total) by mouth 2 (two) times daily with a meal.  180 tablet  3  . clopidogrel (PLAVIX) 75 MG tablet Take 1 tablet (75 mg total) by mouth daily.  90 tablet  3  . ezetimibe (ZETIA) 10 MG tablet Take 1 tablet (10 mg total) by mouth daily.  90 tablet  3  . levothyroxine (SYNTHROID, LEVOTHROID) 175 MCG tablet Take 175 mcg by mouth daily.        . nitroGLYCERIN (NITROSTAT) 0.4 MG SL tablet Place 0.4 mg under the tongue every 5 (five) minutes as needed.        . rosuvastatin (CRESTOR) 20 MG tablet Take 1 tablet (20 mg total) by mouth daily.  90 tablet  3    Allergies  Allergen Reactions  . Diovan (Valsartan) Other (See Comments)    Hypotension at low dose, hyperkalemia    History   Social History  . Marital Status: Married    Spouse Name: N/A    Number of Children: 1  . Years of Education: N/A     Occupational History  . referee   . cleaning company    Social History Main Topics  . Smoking status: Never Smoker   . Smokeless tobacco: Not on file  . Alcohol Use: Yes     occassionlly  . Drug Use: No  . Sexually Active: Not on file   Other Topics Concern  . Not on file   Social History Narrative  . No narrative on file    Family History  Problem Relation Age of Onset  . Heart failure Father   . Stroke Father   . Coronary artery disease Mother   . Heart disease Mother   . Hyperlipidemia Sister     Physical Exam: Filed Vitals:   10/30/11 1053  BP: 127/82  Pulse: 84  Resp: 18  Height: 5\' 7"  (1.702 m)  Weight: 170 lb (77.111 kg)    GEN- The patient is well appearing, alert and oriented x 3 today.   Head- normocephalic, atraumatic Eyes-  Sclera clear, conjunctiva pink Ears- hearing intact Oropharynx- clear Neck- supple, no JVP Lymph- no cervical lymphadenopathy Lungs- Clear to ausculation bilaterally, normal work of breathing Chest- ICD pocket is well healed Heart- Regular rate and rhythm, no murmurs, rubs or gallops, PMI not laterally displaced GI- soft, NT,  ND, + BS Extremities- no clubbing, cyanosis, or edema  ICD interrogation- reviewed in detail today,  See PACEART report  Assessment and Plan:

## 2011-11-12 ENCOUNTER — Telehealth: Payer: Self-pay | Admitting: Cardiology

## 2011-11-12 NOTE — Telephone Encounter (Signed)
Walk in pt Form " Pt Dropped Off Surgical Clearance Form" from [place] " sent to [place] 11/12/11/km

## 2011-11-16 ENCOUNTER — Telehealth: Payer: Self-pay | Admitting: Cardiology

## 2011-11-16 NOTE — Telephone Encounter (Signed)
LOVx2,ICD Check,Stress,Echo,12 lead faxed to  [facility] @ [phone] 11/16/11/KM

## 2011-11-18 ENCOUNTER — Telehealth: Payer: Self-pay

## 2011-11-18 NOTE — Telephone Encounter (Signed)
Received call from [place], Dr.Caffrey's PA stated patient was scheduled to have total knee replacement and he wanted Dr Martinique to know they normally put patient's post op on lovenox for 2 weeks.Wanted Dr.Jordan's recommendations.Spoke with Dr.Jordan ok for lovenox,and then restart plavix 75 mg daily.

## 2011-11-19 ENCOUNTER — Encounter (HOSPITAL_COMMUNITY)
Admission: RE | Admit: 2011-11-19 | Discharge: 2011-11-19 | Disposition: A | Discharge status: Home / Self Care | Payer: Medicare Other | Source: Ambulatory Visit | Attending: Orthopedic Surgery | Admitting: Orthopedic Surgery

## 2011-11-19 ENCOUNTER — Encounter (HOSPITAL_COMMUNITY): Payer: Self-pay

## 2011-11-19 ENCOUNTER — Ambulatory Visit (HOSPITAL_COMMUNITY)
Admission: RE | Admit: 2011-11-19 | Discharge: 2011-11-19 | Disposition: A | Discharge status: Home / Self Care | Payer: Medicare Other | Source: Ambulatory Visit | Attending: Orthopedic Surgery | Admitting: Orthopedic Surgery

## 2011-11-19 ENCOUNTER — Encounter (HOSPITAL_COMMUNITY): Payer: Self-pay | Admitting: Pharmacy Technician

## 2011-11-19 ENCOUNTER — Other Ambulatory Visit: Payer: Self-pay | Admitting: Physician Assistant

## 2011-11-19 DIAGNOSIS — Z01818 Encounter for other preprocedural examination: Secondary | ICD-10-CM | POA: Insufficient documentation

## 2011-11-19 DIAGNOSIS — Z950 Presence of cardiac pacemaker: Secondary | ICD-10-CM | POA: Insufficient documentation

## 2011-11-19 DIAGNOSIS — Z01812 Encounter for preprocedural laboratory examination: Secondary | ICD-10-CM | POA: Insufficient documentation

## 2011-11-19 HISTORY — DX: Hypothyroidism, unspecified: E03.9

## 2011-11-19 HISTORY — DX: Anxiety disorder, unspecified: F41.9

## 2011-11-19 LAB — CBC
HCT: 44.3 % (ref 39.0–52.0)
Hemoglobin: 15.1 g/dL (ref 13.0–17.0)
MCH: 30.4 pg (ref 26.0–34.0)
MCHC: 34.1 g/dL (ref 30.0–36.0)
MCV: 89.1 fL (ref 78.0–100.0)
Platelets: 157 10*3/uL (ref 150–400)
RBC: 4.97 MIL/uL (ref 4.22–5.81)
RDW: 13.8 % (ref 11.5–15.5)
WBC: 6.4 10*3/uL (ref 4.0–10.5)

## 2011-11-19 LAB — APTT: aPTT: 28 seconds (ref 24–37)

## 2011-11-19 LAB — URINALYSIS, ROUTINE W REFLEX MICROSCOPIC
Bilirubin Urine: NEGATIVE
Glucose, UA: NEGATIVE mg/dL
Ketones, ur: NEGATIVE mg/dL
Leukocytes, UA: NEGATIVE
Nitrite: NEGATIVE
Protein, ur: NEGATIVE mg/dL
Specific Gravity, Urine: 1.02 (ref 1.005–1.030)
Urobilinogen, UA: 0.2 mg/dL (ref 0.0–1.0)
pH: 5.5 (ref 5.0–8.0)

## 2011-11-19 LAB — URINE MICROSCOPIC-ADD ON

## 2011-11-19 LAB — COMPREHENSIVE METABOLIC PANEL
ALT: 23 U/L (ref 0–53)
AST: 20 U/L (ref 0–37)
Albumin: 4.1 g/dL (ref 3.5–5.2)
Alkaline Phosphatase: 83 U/L (ref 39–117)
BUN: 19 mg/dL (ref 6–23)
CO2: 27 mEq/L (ref 19–32)
Calcium: 9.4 mg/dL (ref 8.4–10.5)
Chloride: 103 mEq/L (ref 96–112)
Creatinine, Ser: 1.09 mg/dL (ref 0.50–1.35)
GFR calc Af Amer: 79 mL/min — ABNORMAL LOW (ref >90)
GFR calc non Af Amer: 68 mL/min — ABNORMAL LOW (ref >90)
Glucose, Bld: 121 mg/dL — ABNORMAL HIGH (ref 70–99)
Potassium: 4 mEq/L (ref 3.5–5.1)
Sodium: 139 mEq/L (ref 135–145)
Total Bilirubin: 0.5 mg/dL (ref 0.3–1.2)
Total Protein: 6.7 g/dL (ref 6.0–8.3)

## 2011-11-19 LAB — DIFFERENTIAL
Basophils Absolute: 0 10*3/uL (ref 0.0–0.1)
Basophils Relative: 0 % (ref 0–1)
Eosinophils Absolute: 0.2 10*3/uL (ref 0.0–0.7)
Eosinophils Relative: 3 % (ref 0–5)
Lymphocytes Relative: 28 % (ref 12–46)
Lymphs Abs: 1.8 10*3/uL (ref 0.7–4.0)
Monocytes Absolute: 0.4 10*3/uL (ref 0.1–1.0)
Monocytes Relative: 6 % (ref 3–12)
Neutro Abs: 4.1 10*3/uL (ref 1.7–7.7)
Neutrophils Relative %: 63 % (ref 43–77)

## 2011-11-19 LAB — PROTIME-INR
INR: 1.07 (ref 0.00–1.49)
Prothrombin Time: 14.1 seconds (ref 11.6–15.2)

## 2011-11-19 LAB — SURGICAL PCR SCREEN
MRSA, PCR: NEGATIVE
Staphylococcus aureus: NEGATIVE

## 2011-11-19 LAB — ABO/RH: ABO/RH(D): O NEG

## 2011-11-19 MED ORDER — SODIUM CHLORIDE 0.9 % IV SOLN: INTRAVENOUS | Status: DC

## 2011-11-19 MED ORDER — CHLORHEXIDINE GLUCONATE 4 % EX LIQD: 60.0000 mL | Freq: Once | CUTANEOUS | Status: DC

## 2011-11-19 NOTE — Progress Notes (Signed)
Stress test, echo, cath in epic.   Form faxed to dr j allred .     And [employer] notified.

## 2011-11-19 NOTE — Pre-Procedure Instructions (Signed)
Johnny Oneill  11/19/2011   Your procedure is scheduled on:  11/27/11  Report to [facility] at 800 AM.  Call this number if you have problems the morning of surgery: [phone]   Remember:   Do not eat food:After Midnight.  May have clear liquids: up to 4 Hours before arrival.  Clear liquids include soda, tea, black coffee, apple or grape juice, broth.  Take these medicines the morning of surgery with A SIP OF WATER: coreg,synthroid   Do not wear jewelry, make-up or nail polish.  Do not wear lotions, powders, or perfumes. You may wear deodorant.  Do not shave 48 hours prior to surgery.  Do not bring valuables to the hospital.  Contacts, dentures or bridgework may not be worn into surgery.  Leave suitcase in the car. After surgery it may be brought to your room.  For patients admitted to the hospital, checkout time is 11:00 AM the day of discharge.   Patients discharged the day of surgery will not be allowed to drive home.  Name and phone number of your driver: family  Special Instructions: CHG Shower Use Special Wash: 1/2 bottle night before surgery and 1/2 bottle morning of surgery.   Please read over the following fact sheets that you were given: Pain Booklet, Coughing and Deep Breathing, Blood Transfusion Information, MRSA Information and Surgical Site Infection Prevention

## 2011-11-19 NOTE — Progress Notes (Signed)
Completed not in med rec.

## 2011-11-20 LAB — URINE CULTURE
Colony Count: NO GROWTH
Culture  Setup Time: 201303211404
Culture: NO GROWTH

## 2011-11-24 NOTE — H&P (Signed)
NAME: Johnny Oneill MRN: [id number] DATE: November 17, 2011 DOB: 09/18/1940  COMPLAINT:     Follow up left knee osteoarthritis.  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  He has failed conservative treatment with oral nonsteroidal anti-inflammatory drugs, pain medications, steroid injections, viscosupplementation, and knee arthroscopy.  He has previous x-rays documenting bone-on-bone arthritis of the left knee that is significantly affecting his quality of life and activities of daily living.  He has the most pain with weightbearing and occasionally will awaken him from sleep.  CURRENT MEDICATIONS:     Crestor 20 mg daily, carvedilol 12.5 mg b.i.d., clopidogrel/Plavix 75 mg daily, levothyroxine 150 mg daily, Zetia 10 mg daily, NitroQuick 0.4 mg p.r.n., and aspirin 325 mg daily.  ALLERGIES:     NKDA.  PAST MEDICAL HISTORY:     Osteoarthritis in knees, ischemic cardiomyopathy of coronary artery disease, history of myocardial infarction, and hypercholesterolemia.  PAST SURGICAL HISTORY:     Right knee scope 2003, left knee scope 2005, heart stents 2005, myocardial infarction with additional stent 2009, and pacemaker/defibrillator placement November 2009.  ROS:    Ten point systemic review is obtained and is positive dentures, myocardial infarction, heart disease, and thyroid problems.  FAMILY HISTORY:    Positive for heart disease and heart attack in mother; diabetes, cancer, and stroke in father.  SOCIAL HISTORY:    The patient is a nonsmoker.  He does not drink any alcohol.  He is married and lives with his wife in a two-story residence.  He has one living child.  He is retired from the [employer].  EXAM:     The patient is seated in the examination room in no  acute distress.  He is alert, oriented, and appears appropriate age.  HT:  5'7"   WT:  170 pounds  BMI:  26.6  TEMP:  97.9 degrees  BP:  118/76    P:  72     R:  18  Skin:   No rashes, no lesions. HEENT:   PERRLA.  He does have partial upper dentures.  Neck is supple with good range of motion.   Chest:   Normal breath sounds.  Lungs clear to auscultation bilaterally.   Heart:   Regular rate and rhythm.  No signs of murmurs.   Abdomen:   Soft, non tender.  Active bowel sounds in all four quadrants. Rectal:    Not indicated for surgery. Neuro:   Cranial nerves grossly intact. Musculoskeletal:    Examination of the left lower extremity shows that he is neurovascularly intact.  He has tenderness to palpation over the joint line, positive patellofemoral crepitus.  Strength is good.  Hip range of motion is normal.    X-RAYS:     No images obtained today.  X-rays taken of the left knee on 11/12/11 show bone-on-bone osteoarthritis.  IMPRESSION:      1. Left knee osteoarthritis, end stage. 2. Ischemic cardiomyopathy. 3. Coronary artery disease. 4. Myocardial infarction with implantable cardioverter-defibrillator in place. 5. Hypercholesterolemia.  6. Hypothyroidism.  RECOMMENDATIONS:     The risks and benefits of left total knee arthroplasty were discussed again today and the patient wishes to proceed.  He has a preadmission appointment scheduled on 11/19/11 and surgery is scheduled for 11/27/11.  He does wish to go  home following the surgery and home health has been arranged with [place].  He has obtained medical clearance from his primary care physician, Dr. Lavone Oneill, and his cardiologist, Dr. Peter Oneill.  They have recommended he come off his Plavix five days prior to surgery and continue on his aspirin daily.    He also has atrial defibrillator that was placed in 2009 by Dr. Rodell Oneill.  The device is [employer], serial number [id number].  The rep should be present prior to surgery to  prepare the pacemaker.  He was given preoperative instructions.  We also discussed postoperative deep venous thrombosis prophylaxis and perioperative antibiotics.  He will be given Ancef and Lovenox.  The Lovenox was cleared with his cardiologist.  If he has any further questions or concerns prior to surgery, he may contact our office.  Due to his significant cardiac history, we will likely consult cardiology while inpatient.   Johnny Oneill P.A.-C/[id number]  Auto-Authenticated by Johnny Oneill P.A.-C

## 2011-11-26 MED ORDER — CEFAZOLIN SODIUM-DEXTROSE 2-3 GM-% IV SOLR
2.0000 g | INTRAVENOUS | Status: AC
  Administered 2011-11-27: 2 g via INTRAVENOUS
  Filled 2011-11-26: qty 50

## 2011-11-27 ENCOUNTER — Inpatient Hospital Stay (HOSPITAL_COMMUNITY)
Admission: RE | Admit: 2011-11-27 | Discharge: 2011-12-02 | DRG: 470 | Disposition: A | Discharge status: Home Health | Payer: Medicare Other | Source: Ambulatory Visit | Attending: Orthopedic Surgery | Admitting: Orthopedic Surgery

## 2011-11-27 ENCOUNTER — Encounter (HOSPITAL_COMMUNITY): Payer: Self-pay | Admitting: Certified Registered Nurse Anesthetist

## 2011-11-27 ENCOUNTER — Ambulatory Visit (HOSPITAL_COMMUNITY): Payer: Medicare Other

## 2011-11-27 ENCOUNTER — Encounter (HOSPITAL_COMMUNITY)
Admission: RE | Disposition: A | Discharge status: Home Health | Payer: Self-pay | Source: Ambulatory Visit | Attending: Orthopedic Surgery

## 2011-11-27 ENCOUNTER — Ambulatory Visit (HOSPITAL_COMMUNITY): Payer: Medicare Other | Admitting: Certified Registered Nurse Anesthetist

## 2011-11-27 ENCOUNTER — Encounter (HOSPITAL_COMMUNITY): Payer: Self-pay | Admitting: Cardiology

## 2011-11-27 DIAGNOSIS — I2589 Other forms of chronic ischemic heart disease: Secondary | ICD-10-CM | POA: Diagnosis present

## 2011-11-27 DIAGNOSIS — I255 Ischemic cardiomyopathy: Secondary | ICD-10-CM | POA: Insufficient documentation

## 2011-11-27 DIAGNOSIS — F411 Generalized anxiety disorder: Secondary | ICD-10-CM | POA: Diagnosis present

## 2011-11-27 DIAGNOSIS — E039 Hypothyroidism, unspecified: Secondary | ICD-10-CM | POA: Diagnosis present

## 2011-11-27 DIAGNOSIS — I2510 Atherosclerotic heart disease of native coronary artery without angina pectoris: Secondary | ICD-10-CM | POA: Insufficient documentation

## 2011-11-27 DIAGNOSIS — M1710 Unilateral primary osteoarthritis, unspecified knee: Principal | ICD-10-CM | POA: Diagnosis present

## 2011-11-27 DIAGNOSIS — Z95810 Presence of automatic (implantable) cardiac defibrillator: Secondary | ICD-10-CM | POA: Insufficient documentation

## 2011-11-27 DIAGNOSIS — I10 Essential (primary) hypertension: Secondary | ICD-10-CM | POA: Insufficient documentation

## 2011-11-27 DIAGNOSIS — Z79899 Other long term (current) drug therapy: Secondary | ICD-10-CM

## 2011-11-27 DIAGNOSIS — D62 Acute posthemorrhagic anemia: Secondary | ICD-10-CM

## 2011-11-27 DIAGNOSIS — E7800 Pure hypercholesterolemia, unspecified: Secondary | ICD-10-CM | POA: Insufficient documentation

## 2011-11-27 DIAGNOSIS — I509 Heart failure, unspecified: Secondary | ICD-10-CM | POA: Diagnosis present

## 2011-11-27 DIAGNOSIS — Z7982 Long term (current) use of aspirin: Secondary | ICD-10-CM

## 2011-11-27 DIAGNOSIS — Z7902 Long term (current) use of antithrombotics/antiplatelets: Secondary | ICD-10-CM

## 2011-11-27 DIAGNOSIS — I4510 Unspecified right bundle-branch block: Secondary | ICD-10-CM | POA: Diagnosis present

## 2011-11-27 DIAGNOSIS — M1990 Unspecified osteoarthritis, unspecified site: Secondary | ICD-10-CM | POA: Insufficient documentation

## 2011-11-27 DIAGNOSIS — R55 Syncope and collapse: Secondary | ICD-10-CM | POA: Diagnosis not present

## 2011-11-27 DIAGNOSIS — R110 Nausea: Secondary | ICD-10-CM

## 2011-11-27 DIAGNOSIS — I5022 Chronic systolic (congestive) heart failure: Secondary | ICD-10-CM | POA: Insufficient documentation

## 2011-11-27 DIAGNOSIS — I252 Old myocardial infarction: Secondary | ICD-10-CM

## 2011-11-27 HISTORY — DX: Unspecified right bundle-branch block: I45.10

## 2011-11-27 HISTORY — DX: Ventricular premature depolarization: I49.3

## 2011-11-27 HISTORY — DX: Essential (primary) hypertension: I10

## 2011-11-27 HISTORY — DX: ST elevation (STEMI) myocardial infarction involving other coronary artery of inferior wall: I21.19

## 2011-11-27 HISTORY — DX: Presence of cardiac pacemaker: Z95.0

## 2011-11-27 HISTORY — PX: TOTAL KNEE ARTHROPLASTY: SHX125

## 2011-11-27 SURGERY — ARTHROPLASTY, KNEE, TOTAL
Anesthesia: Regional | Site: Knee | Laterality: Left | Wound class: Clean

## 2011-11-27 MED ORDER — ASPIRIN 325 MG PO TABS
325.0000 mg | ORAL_TABLET | Freq: Every day | ORAL | Status: DC
  Administered 2011-11-27 – 2011-12-02 (×6): 325 mg via ORAL
  Filled 2011-11-27 (×6): qty 1

## 2011-11-27 MED ORDER — LIDOCAINE HCL (CARDIAC) 20 MG/ML IV SOLN
INTRAVENOUS | Status: DC | PRN
  Administered 2011-11-27: 70 mg via INTRAVENOUS

## 2011-11-27 MED ORDER — BUPIVACAINE-EPINEPHRINE PF 0.5-1:200000 % IJ SOLN
INTRAMUSCULAR | Status: DC | PRN
  Administered 2011-11-27: 25 mL

## 2011-11-27 MED ORDER — SODIUM CHLORIDE 0.9 % IV SOLN
INTRAVENOUS | Status: DC
  Administered 2011-11-27: 16:00:00 via INTRAVENOUS

## 2011-11-27 MED ORDER — LACTATED RINGERS IV SOLN
INTRAVENOUS | Status: DC
  Administered 2011-11-27: 09:00:00 via INTRAVENOUS

## 2011-11-27 MED ORDER — CHLORHEXIDINE GLUCONATE 4 % EX LIQD: 60.0000 mL | Freq: Once | CUTANEOUS | Status: DC

## 2011-11-27 MED ORDER — SIMVASTATIN 40 MG PO TABS: 40.0000 mg | ORAL_TABLET | Freq: Every day | ORAL | Status: DC

## 2011-11-27 MED ORDER — CEFAZOLIN SODIUM 1-5 GM-% IV SOLN
1.0000 g | Freq: Four times a day (QID) | INTRAVENOUS | Status: AC
  Administered 2011-11-27 – 2011-11-28 (×3): 1 g via INTRAVENOUS
  Filled 2011-11-27 (×3): qty 50

## 2011-11-27 MED ORDER — ACETAMINOPHEN 10 MG/ML IV SOLN
1000.0000 mg | Freq: Four times a day (QID) | INTRAVENOUS | Status: DC
  Administered 2011-11-27: 1000 mg via INTRAVENOUS

## 2011-11-27 MED ORDER — FENTANYL CITRATE 0.05 MG/ML IJ SOLN
INTRAMUSCULAR | Status: DC | PRN
  Administered 2011-11-27: 100 ug via INTRAVENOUS
  Administered 2011-11-27 (×2): 50 ug via INTRAVENOUS

## 2011-11-27 MED ORDER — ROSUVASTATIN CALCIUM 20 MG PO TABS
20.0000 mg | ORAL_TABLET | Freq: Every day | ORAL | Status: DC
  Administered 2011-11-28 – 2011-12-01 (×4): 20 mg via ORAL
  Filled 2011-11-27 (×5): qty 1

## 2011-11-27 MED ORDER — ONDANSETRON HCL 4 MG/2ML IJ SOLN
4.0000 mg | Freq: Four times a day (QID) | INTRAMUSCULAR | Status: DC | PRN
  Administered 2011-11-27 (×2): 4 mg via INTRAVENOUS
  Filled 2011-11-27 (×2): qty 2

## 2011-11-27 MED ORDER — METOCLOPRAMIDE HCL 5 MG/ML IJ SOLN
5.0000 mg | Freq: Three times a day (TID) | INTRAMUSCULAR | Status: DC | PRN
Start: 2011-11-27 — End: 2011-12-02
  Filled 2011-11-27: qty 2

## 2011-11-27 MED ORDER — ACETAMINOPHEN 325 MG PO TABS
650.0000 mg | ORAL_TABLET | Freq: Four times a day (QID) | ORAL | Status: DC | PRN
  Administered 2011-11-29 – 2011-11-30 (×2): 650 mg via ORAL
  Filled 2011-11-27 (×3): qty 2

## 2011-11-27 MED ORDER — PHENYLEPHRINE HCL 10 MG/ML IJ SOLN
INTRAMUSCULAR | Status: DC | PRN
  Administered 2011-11-27 (×3): 80 ug via INTRAVENOUS

## 2011-11-27 MED ORDER — METHOCARBAMOL 500 MG PO TABS
500.0000 mg | ORAL_TABLET | Freq: Four times a day (QID) | ORAL | Status: DC | PRN
  Administered 2011-11-28: 500 mg via ORAL
  Filled 2011-11-27: qty 1

## 2011-11-27 MED ORDER — ACETAMINOPHEN 650 MG RE SUPP: 650.0000 mg | Freq: Four times a day (QID) | RECTAL | Status: DC | PRN

## 2011-11-27 MED ORDER — EZETIMIBE 10 MG PO TABS
10.0000 mg | ORAL_TABLET | Freq: Every day | ORAL | Status: DC
  Administered 2011-11-27 – 2011-12-02 (×6): 10 mg via ORAL
  Filled 2011-11-27 (×6): qty 1

## 2011-11-27 MED ORDER — ONDANSETRON HCL 4 MG/2ML IJ SOLN: 4.0000 mg | Freq: Once | INTRAMUSCULAR | Status: DC | PRN

## 2011-11-27 MED ORDER — ENOXAPARIN SODIUM 30 MG/0.3ML ~~LOC~~ SOLN
30.0000 mg | Freq: Two times a day (BID) | SUBCUTANEOUS | Status: DC
  Administered 2011-11-28 – 2011-11-30 (×5): 30 mg via SUBCUTANEOUS
  Filled 2011-11-27 (×8): qty 0.3

## 2011-11-27 MED ORDER — ROCURONIUM BROMIDE 100 MG/10ML IV SOLN
INTRAVENOUS | Status: DC | PRN
  Administered 2011-11-27: 50 mg via INTRAVENOUS

## 2011-11-27 MED ORDER — OXYCODONE HCL 5 MG PO TABS
5.0000 mg | ORAL_TABLET | ORAL | Status: DC | PRN
  Administered 2011-11-27 – 2011-12-01 (×6): 5 mg via ORAL
  Administered 2011-12-02 (×2): 10 mg via ORAL
  Filled 2011-11-27 (×8): qty 1
  Filled 2011-11-27: qty 2

## 2011-11-27 MED ORDER — SODIUM CHLORIDE 0.9 % IR SOLN
Status: DC | PRN
  Administered 2011-11-27: 4000 mL

## 2011-11-27 MED ORDER — LEVOTHYROXINE SODIUM 150 MCG PO TABS
150.0000 ug | ORAL_TABLET | Freq: Every day | ORAL | Status: DC
  Administered 2011-11-27 – 2011-12-02 (×6): 150 ug via ORAL
  Filled 2011-11-27 (×6): qty 1

## 2011-11-27 MED ORDER — METOCLOPRAMIDE HCL 5 MG PO TABS
5.0000 mg | ORAL_TABLET | Freq: Three times a day (TID) | ORAL | Status: DC | PRN
  Filled 2011-11-27: qty 2

## 2011-11-27 MED ORDER — HYDROMORPHONE HCL PF 1 MG/ML IJ SOLN: 0.2500 mg | INTRAMUSCULAR | Status: DC | PRN

## 2011-11-27 MED ORDER — EPHEDRINE SULFATE 50 MG/ML IJ SOLN
INTRAMUSCULAR | Status: DC | PRN
  Administered 2011-11-27 (×2): 10 mg via INTRAVENOUS
  Administered 2011-11-27: 5 mg via INTRAVENOUS
  Administered 2011-11-27: 10 mg via INTRAVENOUS

## 2011-11-27 MED ORDER — FENTANYL CITRATE 0.05 MG/ML IJ SOLN: 50.0000 ug | INTRAMUSCULAR | Status: DC | PRN

## 2011-11-27 MED ORDER — ONDANSETRON HCL 4 MG PO TABS
4.0000 mg | ORAL_TABLET | Freq: Four times a day (QID) | ORAL | Status: DC | PRN
  Administered 2011-11-30: 4 mg via ORAL
  Filled 2011-11-27 (×2): qty 1

## 2011-11-27 MED ORDER — DIPHENHYDRAMINE HCL 12.5 MG/5ML PO ELIX
12.5000 mg | ORAL_SOLUTION | ORAL | Status: DC | PRN
  Administered 2011-12-01 (×2): 25 mg via ORAL
  Filled 2011-11-27 (×3): qty 10

## 2011-11-27 MED ORDER — NEOSTIGMINE METHYLSULFATE 1 MG/ML IJ SOLN
INTRAMUSCULAR | Status: DC | PRN
  Administered 2011-11-27: 4 mg via INTRAVENOUS

## 2011-11-27 MED ORDER — HYDROMORPHONE HCL PF 1 MG/ML IJ SOLN
0.5000 mg | INTRAMUSCULAR | Status: DC | PRN
  Administered 2011-11-28: 0.5 mg via INTRAVENOUS
  Administered 2011-11-28: 1 mg via INTRAVENOUS
  Administered 2011-11-28: 0.5 mg via INTRAVENOUS
  Administered 2011-11-28 – 2011-11-29 (×3): 1 mg via INTRAVENOUS
  Administered 2011-11-29 – 2011-11-30 (×3): 0.5 mg via INTRAVENOUS
  Filled 2011-11-27 (×9): qty 1

## 2011-11-27 MED ORDER — ACETAMINOPHEN 10 MG/ML IV SOLN
1000.0000 mg | Freq: Four times a day (QID) | INTRAVENOUS | Status: AC
  Administered 2011-11-27 – 2011-11-28 (×4): 1000 mg via INTRAVENOUS
  Filled 2011-11-27 (×4): qty 100

## 2011-11-27 MED ORDER — SODIUM CHLORIDE 0.9 % IV BOLUS (SEPSIS)
300.0000 mL | Freq: Once | INTRAVENOUS | Status: AC
  Administered 2011-11-27: 300 mL via INTRAVENOUS

## 2011-11-27 MED ORDER — ACETAMINOPHEN 10 MG/ML IV SOLN
INTRAVENOUS | Status: AC
  Filled 2011-11-27: qty 100

## 2011-11-27 MED ORDER — ALUM & MAG HYDROXIDE-SIMETH 200-200-20 MG/5ML PO SUSP: 30.0000 mL | ORAL | Status: DC | PRN

## 2011-11-27 MED ORDER — SENNOSIDES-DOCUSATE SODIUM 8.6-50 MG PO TABS
1.0000 | ORAL_TABLET | Freq: Every evening | ORAL | Status: DC | PRN
  Administered 2011-11-30 – 2011-12-01 (×2): 1 via ORAL
  Filled 2011-11-27 (×3): qty 1

## 2011-11-27 MED ORDER — ONDANSETRON HCL 4 MG/2ML IJ SOLN
INTRAMUSCULAR | Status: DC | PRN
  Administered 2011-11-27: 4 mg via INTRAVENOUS

## 2011-11-27 MED ORDER — LACTATED RINGERS IV SOLN
INTRAVENOUS | Status: DC | PRN
  Administered 2011-11-27 (×3): via INTRAVENOUS

## 2011-11-27 MED ORDER — PHENOL 1.4 % MT LIQD
1.0000 | OROMUCOSAL | Status: DC | PRN
  Filled 2011-11-27: qty 177

## 2011-11-27 MED ORDER — GLYCOPYRROLATE 0.2 MG/ML IJ SOLN
INTRAMUSCULAR | Status: DC | PRN
  Administered 2011-11-27: .6 mg via INTRAVENOUS

## 2011-11-27 MED ORDER — METHOCARBAMOL 100 MG/ML IJ SOLN
500.0000 mg | Freq: Four times a day (QID) | INTRAVENOUS | Status: DC | PRN
  Filled 2011-11-27: qty 5

## 2011-11-27 MED ORDER — PROPOFOL 10 MG/ML IV BOLUS
INTRAVENOUS | Status: DC | PRN
  Administered 2011-11-27: 200 mg via INTRAVENOUS

## 2011-11-27 MED ORDER — DOCUSATE SODIUM 100 MG PO CAPS
100.0000 mg | ORAL_CAPSULE | Freq: Two times a day (BID) | ORAL | Status: DC
  Administered 2011-11-28 – 2011-12-02 (×9): 100 mg via ORAL
  Filled 2011-11-27 (×12): qty 1

## 2011-11-27 MED ORDER — MENTHOL 3 MG MT LOZG
1.0000 | LOZENGE | OROMUCOSAL | Status: DC | PRN
  Administered 2011-11-27: 3 mg via ORAL
  Filled 2011-11-27: qty 9

## 2011-11-27 MED ORDER — NITROGLYCERIN 0.4 MG SL SUBL: 0.4000 mg | SUBLINGUAL_TABLET | SUBLINGUAL | Status: DC | PRN

## 2011-11-27 MED ORDER — CARVEDILOL 12.5 MG PO TABS
12.5000 mg | ORAL_TABLET | Freq: Two times a day (BID) | ORAL | Status: DC
  Administered 2011-11-28 – 2011-12-01 (×8): 12.5 mg via ORAL
  Administered 2011-12-02: 08:00:00 via ORAL
  Filled 2011-11-27 (×12): qty 1

## 2011-11-27 MED ORDER — FLEET ENEMA 7-19 GM/118ML RE ENEM
1.0000 | ENEMA | Freq: Once | RECTAL | Status: AC | PRN
  Filled 2011-11-27: qty 1

## 2011-11-27 SURGICAL SUPPLY — 66 items
BANDAGE ESMARK 6X9 LF (GAUZE/BANDAGES/DRESSINGS) ×1 IMPLANT
BLADE SAGITTAL 25.0X1.19X90 (BLADE) ×2 IMPLANT
BLADE SAW SAG 90X13X1.27 (BLADE) ×2 IMPLANT
BNDG CMPR 9X6 STRL LF SNTH (GAUZE/BANDAGES/DRESSINGS) ×1
BNDG CMPR MED 10X6 ELC LF (GAUZE/BANDAGES/DRESSINGS) ×1
BNDG ELASTIC 6X10 VLCR STRL LF (GAUZE/BANDAGES/DRESSINGS) ×2 IMPLANT
BNDG ESMARK 6X9 LF (GAUZE/BANDAGES/DRESSINGS) ×2
BOWL SMART MIX CTS (DISPOSABLE) ×2 IMPLANT
CEMENT HV SMART SET (Cement) ×4 IMPLANT
CLOTH BEACON ORANGE TIMEOUT ST (SAFETY) ×2 IMPLANT
COVER BACK TABLE 24X17X13 BIG (DRAPES) IMPLANT
COVER SURGICAL LIGHT HANDLE (MISCELLANEOUS) ×2 IMPLANT
CUFF TOURNIQUET SINGLE 34IN LL (TOURNIQUET CUFF) ×2 IMPLANT
CUFF TOURNIQUET SINGLE 44IN (TOURNIQUET CUFF) IMPLANT
DRAPE INCISE IOBAN 66X45 STRL (DRAPES) IMPLANT
DRAPE ORTHO SPLIT 77X108 STRL (DRAPES) ×4
DRAPE PROXIMA HALF (DRAPES) ×2 IMPLANT
DRAPE SURG ORHT 6 SPLT 77X108 (DRAPES) ×2 IMPLANT
DRAPE U-SHAPE 47X51 STRL (DRAPES) ×2 IMPLANT
DRSG ADAPTIC 3X8 NADH LF (GAUZE/BANDAGES/DRESSINGS) ×2 IMPLANT
DRSG PAD ABDOMINAL 8X10 ST (GAUZE/BANDAGES/DRESSINGS) ×2 IMPLANT
DURAPREP 26ML APPLICATOR (WOUND CARE) ×2 IMPLANT
ELECT REM PT RETURN 9FT ADLT (ELECTROSURGICAL) ×2
ELECTRODE REM PT RTRN 9FT ADLT (ELECTROSURGICAL) ×1 IMPLANT
EVACUATOR 1/8 PVC DRAIN (DRAIN) ×2 IMPLANT
FACESHIELD LNG OPTICON STERILE (SAFETY) ×2 IMPLANT
FLOSEAL 10ML (HEMOSTASIS) IMPLANT
GLOVE BIOGEL PI IND STRL 7.0 (GLOVE) ×2 IMPLANT
GLOVE BIOGEL PI IND STRL 8 (GLOVE) ×3 IMPLANT
GLOVE BIOGEL PI INDICATOR 7.0 (GLOVE) ×2
GLOVE BIOGEL PI INDICATOR 8 (GLOVE) ×3
GLOVE ORTHO TXT STRL SZ7.5 (GLOVE) ×2 IMPLANT
GLOVE SURG ORTHO 8.0 STRL STRW (GLOVE) ×2 IMPLANT
GLOVE SURG SS PI 6.5 STRL IVOR (GLOVE) ×4 IMPLANT
GLOVE SURG SS PI 7.5 STRL IVOR (GLOVE) ×2 IMPLANT
GOWN PREVENTION PLUS XLARGE (GOWN DISPOSABLE) ×2 IMPLANT
GOWN PREVENTION PLUS XXLARGE (GOWN DISPOSABLE) ×4 IMPLANT
GOWN STRL NON-REIN LRG LVL3 (GOWN DISPOSABLE) ×2 IMPLANT
GOWN STRL REIN XL XLG (GOWN DISPOSABLE) ×2 IMPLANT
HANDPIECE INTERPULSE COAX TIP (DISPOSABLE) ×2
HOOD PEEL AWAY FACE SHEILD DIS (HOOD) ×2 IMPLANT
IMMOBILIZER KNEE 22 UNIV (SOFTGOODS) ×2 IMPLANT
KIT BASIN OR (CUSTOM PROCEDURE TRAY) ×2 IMPLANT
KIT ROOM TURNOVER OR (KITS) ×2 IMPLANT
MANIFOLD NEPTUNE II (INSTRUMENTS) ×2 IMPLANT
NEEDLE 22X1 1/2 (OR ONLY) (NEEDLE) IMPLANT
NS IRRIG 1000ML POUR BTL (IV SOLUTION) ×2 IMPLANT
PACK TOTAL JOINT (CUSTOM PROCEDURE TRAY) ×2 IMPLANT
PAD ARMBOARD 7.5X6 YLW CONV (MISCELLANEOUS) ×4 IMPLANT
PAD CAST 4YDX4 CTTN HI CHSV (CAST SUPPLIES) ×1 IMPLANT
PADDING CAST COTTON 4X4 STRL (CAST SUPPLIES) ×2
PADDING CAST COTTON 6X4 STRL (CAST SUPPLIES) ×2 IMPLANT
SET HNDPC FAN SPRY TIP SCT (DISPOSABLE) ×1 IMPLANT
SPONGE GAUZE 4X4 12PLY (GAUZE/BANDAGES/DRESSINGS) ×2 IMPLANT
STAPLER VISISTAT 35W (STAPLE) ×2 IMPLANT
SUCTION FRAZIER TIP 10 FR DISP (SUCTIONS) ×2 IMPLANT
SUT ETHIBOND NAB CT1 #1 30IN (SUTURE) ×4 IMPLANT
SUT VIC AB 0 CT1 27 (SUTURE)
SUT VIC AB 0 CT1 27XBRD ANBCTR (SUTURE) IMPLANT
SUT VIC AB 2-0 CT1 27 (SUTURE) ×6
SUT VIC AB 2-0 CT1 TAPERPNT 27 (SUTURE) ×3 IMPLANT
SYR CONTROL 10ML LL (SYRINGE) IMPLANT
TOWEL OR 17X24 6PK STRL BLUE (TOWEL DISPOSABLE) ×2 IMPLANT
TOWEL OR 17X26 10 PK STRL BLUE (TOWEL DISPOSABLE) ×2 IMPLANT
TRAY FOLEY CATH 14FR (SET/KITS/TRAYS/PACK) ×2 IMPLANT
WATER STERILE IRR 1000ML POUR (IV SOLUTION) ×4 IMPLANT

## 2011-11-27 NOTE — Progress Notes (Addendum)
Spoke with Cyril Mourning from orthopedics. Pt complaining of nausea and lightheadedness, appears pale. BP drop to 90/60's. No obvious signs of bleeding. Cyril Mourning said she would call Dr Martinique.  Spoke with Dr. Martinique on floor about pt's BP dropping and complaints of nausea/lightheadedness. Order to give 300 NS bolus. Will continue to monitor. Lillia Mountain, Rn

## 2011-11-27 NOTE — Progress Notes (Signed)
Orthopedic Tech Progress Note Patient Details:  Johnny Oneill 08-08-1941 [id number]  CPM Left Knee CPM Left Knee: On Left Knee Flexion (Degrees): 90  Left Knee Extension (Degrees): 0  Additional Comments: trapeze bar    [name], Johnny Oneill 11/27/2011, 2:31 PM

## 2011-11-27 NOTE — Transfer of Care (Signed)
Immediate Anesthesia Transfer of Care Note  Patient: Johnny Oneill  Procedure(s) Performed: Procedure(s) (LRB): TOTAL KNEE ARTHROPLASTY (Left)  Patient Location: PACU  Anesthesia Type: GA combined with regional for post-op pain  Level of Consciousness: awake, alert  and oriented  Airway & Oxygen Therapy: Patient Spontanous Breathing and Patient connected to nasal cannula oxygen  Post-op Assessment: Report given to PACU RN and Post -op Vital signs reviewed and stable  Post vital signs: Reviewed and stable  Complications: No apparent anesthesia complications

## 2011-11-27 NOTE — Anesthesia Postprocedure Evaluation (Signed)
  Anesthesia Post-op Note  Patient: Johnny Oneill  Procedure(s) Performed: Procedure(s) (LRB): TOTAL KNEE ARTHROPLASTY (Left)  Patient Location: PACU  Anesthesia Type: GA combined with regional for post-op pain  Level of Consciousness: awake, alert , oriented and patient cooperative  Airway and Oxygen Therapy: Patient Spontanous Breathing and Patient connected to nasal cannula oxygen  Post-op Pain: mild  Post-op Assessment: Post-op Vital signs reviewed, Patient's Cardiovascular Status Stable, Respiratory Function Stable, Patent Airway, No signs of Nausea or vomiting and Pain level controlled  Post-op Vital Signs: stable  Complications: No apparent anesthesia complications

## 2011-11-27 NOTE — Op Note (Signed)
Dictated [phone]

## 2011-11-27 NOTE — Progress Notes (Signed)
Orthopedic Tech Progress Note Patient Details:  [name] 10/04/1940 [id number] Off cpm at 2104. Patient ID: Johnny Oneill, male   DOB: 05-22-1941, 71 y.o.   MRN: [id number]   Braulio Bosch 11/27/2011, 9:06 PM

## 2011-11-27 NOTE — Brief Op Note (Signed)
11/27/2011  2:21 PM  PATIENT:  Johnny Oneill  71 y.o. male  PRE-OPERATIVE DIAGNOSIS:  osteoarthritis left knee  POST-OPERATIVE DIAGNOSIS:  osteoarthritis left knee  PROCEDURE:  Procedure(s) (LRB): TOTAL KNEE ARTHROPLASTY (Left)  SURGEON:  Surgeon(s) and Role:    * W D [name]., MD - Primary  PHYSICIAN ASSISTANT:   ASSISTANTS: Chriss Czar, PA-C   ANESTHESIA:   general and block  EBL:  Total I/O In: 2300 [I.V.:2300] Out: 550 [Urine:400; Blood:150]  BLOOD ADMINISTERED:none  DRAINS: (1) Hemovact drain(s) in the left knee with  Suction Open   LOCAL MEDICATIONS USED:  NONE  SPECIMEN:  No Specimen  DISPOSITION OF SPECIMEN:  N/A  COUNTS:  YES  TOURNIQUET:   Total Tourniquet Time Documented: Thigh (Left) - 78 minutes  DICTATION: .Other Dictation: Dictation Number   PLAN OF CARE: Admit to inpatient   PATIENT DISPOSITION:  PACU - hemodynamically stable.   Delay start of Pharmacological VTE agent (>24hrs) due to surgical blood loss or risk of bleeding: yes, continue home Aspirin dose

## 2011-11-27 NOTE — Anesthesia Procedure Notes (Addendum)
Anesthesia Regional Block:  Femoral nerve block  Pre-Anesthetic Checklist: ,, timeout performed, Correct Patient, Correct Site, Correct Laterality, Correct Procedure, Correct Position, site marked, Risks and benefits discussed,  Surgical consent,  Pre-op evaluation,  At surgeon's request and post-op pain management  Laterality: Left  Prep: Maximum Sterile Barrier Precautions used and chloraprep       Needles:  Injection technique: Single-shot  Needle Type: Stimulator Needle - 80        Needle insertion depth: 4 cm   Additional Needles:  Procedures: nerve stimulator Femoral nerve block  Nerve Stimulator or Paresthesia:  Response: 0.5 mA, 0.1 ms, 4 cm  Additional Responses:   Narrative:  Start time: 11/27/2011 9:55 AM End time: 11/27/2011 10:03 AM Injection made incrementally with aspirations every 5 mL.  Performed by: Personally  Anesthesiologist: Sharolyn Douglas MD  Additional Notes: 25cc 0.5% Marcaine w/ epi w/o discomfort or difficulty  GES   Procedure Name: LMA Insertion Date/Time: 11/27/2011 11:18 AM Performed by: Angelique Holm Pre-anesthesia Checklist: Patient identified, Emergency Drugs available, Suction available, Patient being monitored and Timeout performed Patient Re-evaluated:Patient Re-evaluated prior to inductionOxygen Delivery Method: Circle system utilized Preoxygenation: Pre-oxygenation with 100% oxygen Intubation Type: IV induction LMA: LMA with gastric port inserted LMA Size: 4.0 Number of attempts: 1 Placement Confirmation: positive ETCO2 and breath sounds checked- equal and bilateral Tube secured with: Tape Dental Injury: Teeth and Oropharynx as per pre-operative assessment     Procedure Name: Intubation Date/Time: 11/27/2011 12:02 PM Performed by: Angelique Holm Pre-anesthesia Checklist: Patient identified, Emergency Drugs available, Suction available, Patient being monitored and Timeout performed Patient Re-evaluated:Patient  Re-evaluated prior to inductionOxygen Delivery Method: Circle system utilized Intubation Type: Combination inhalational/ intravenous induction Laryngoscope Size: Mac and 4 Grade View: Grade I Tube type: Oral Tube size: 7.5 mm Number of attempts: 1 Airway Equipment and Method: Stylet Placement Confirmation: ETT inserted through vocal cords under direct vision,  positive ETCO2 and breath sounds checked- equal and bilateral Secured at: 21 cm Tube secured with: Tape Dental Injury: Teeth and Oropharynx as per pre-operative assessment  Comments: Intubation by Arnoldo Hooker, CRNA

## 2011-11-27 NOTE — Interval H&P Note (Signed)
History and Physical Interval Note:  11/27/2011 10:12 AM  Johnny Oneill  has presented today for surgery, with the diagnosis of osteoarthritis left knee  The various methods of treatment have been discussed with the patient and family. After consideration of risks, benefits and other options for treatment, the patient has consented to  Procedure(s) (LRB): TOTAL KNEE ARTHROPLASTY (Left) as a surgical intervention .  The patients' history has been reviewed, patient examined, no change in status, stable for surgery.  I have reviewed the patients' chart and labs.  Questions were answered to the patient's satisfaction.     Myrtle Barnhard JR,W D

## 2011-11-27 NOTE — Preoperative (Signed)
Beta Blockers   Reason not to administer Beta Blockers:Not Applicable, took Coreg this am

## 2011-11-27 NOTE — Op Note (Signed)
NAME:  Johnny Oneill, Johnny Oneill NO.:  [id number]  MEDICAL RECORD NO.:  [id number]  LOCATION:  94                         FACILITY:  [facility]  PHYSICIAN:  Lockie Pares, M.D.    DATE OF BIRTH:  Oct 21, 1940  DATE OF PROCEDURE: DATE OF DISCHARGE:                              OPERATIVE REPORT   INDICATION:  This is a 71 year old with intractable left knee pain and end-stage arthritis, thought to be amenable to hospitalization, total knee replacement.  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  OPERATION:  Left total knee replacement (Sigma cemented DePuy knee with size 3 femur, size 4 tibia, 12.5 mg bearing, and 38 mm all poly patella).  SURGEON:  Lockie Pares, MD  ASSISTANT:  Chriss Czar, PA-C.  ANESTHESIA:  General with a femoral nerve block.  BLOOD LOSS:  Minimal.  TOURNIQUET TIME:  1 hour and 15 minutes.  DESCRIPTION OF PROCEDURE:  After sterile prep and drape, exsanguination of the leg, inflation to 350, a straight skin incision was made and medial parapatellar approach to the knee was made.  In view of the flexion contracture, we cut a 11-mm with a 5-degree valgus cut of the distal femur.  We followed that with resecting about 4 mm below the most diseased medial compartment.  We did some medial stripping and medial exposure due to the varus deformity and released the PCL.  Tibia was cut, followed by checking the extension gap, eventually measured to match the flexion gap at 12.5 mm.  Attention was next directed to sizing the femur.  Femur was initially sized for a 4, but once we had made an anterior cut off the femur, we noticed that we would not really resect adequate anterior bone, we then used a posterior referencing pin to downsize to a size 3, which enabled [place] to resect adequate anterior  bone. We then did the anterior-posterior cuts, chamfer cuts, and the keel hole for the prosthesis as well.  The tibia was next addressed.  Excess amount of PCL was removed.  We stripped it posteriorly to resect some posterior osteophytes to reveal medial side of the knee.  The tibia was sized to be a size 3 followed by cutting the keel hole to the tibial prosthesis and placement of the trial.  The trial femur was placed after box cut, was made for the femoral prosthesis and then we did range of motion with a 12.5 and 15 mm bearing.  I felt like the 12.5 reproduced knee mechanics and allowed full extension without any undue laxity noted.  Patella was cut leaving about 16 mm of the anterior native patella for 3 peg patellar trial and all trials were carried out, motion and etc were deemed to be acceptable.  We removed the trial components.  Pulsatile lavage of the bony surfaces. We then placed the cement in the doughy stage, tibia followed by femur patella.  We used a trial bearing on final components to assess the adequacy of the trial relative to bearing the thickness, was also allowed inspection of  the posterior aspect of the knee.  Before releasing the tourniquet, we checked for any excess cement and did not see any.  We released the tourniquet, removed the trial bearing, no excessive bleeding was noted.  We collected small bleeders.  We placed a Hemovac drain actually superolaterally with closure with #1 Ethibond 2-0 Vicryl and skin clips.  Taken to recovery room in stable condition.     Lockie Pares, M.D.     WDC/MEDQ  D:  11/27/2011  T:  11/27/2011  Job:  [id number]

## 2011-11-27 NOTE — H&P (View-Only) (Signed)
NAME: Johnny Oneill MRN: [id number] DATE: November 17, 2011 DOB: 1941/01/13  COMPLAINT:     Follow up left knee osteoarthritis.  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  He has failed conservative treatment with oral nonsteroidal anti-inflammatory drugs, pain medications, steroid injections, viscosupplementation, and knee arthroscopy.  He has previous x-rays documenting bone-on-bone arthritis of the left knee that is significantly affecting his quality of life and activities of daily living.  He has the most pain with weightbearing and occasionally will awaken him from sleep.  CURRENT MEDICATIONS:     Crestor 20 mg daily, carvedilol 12.5 mg b.i.d., clopidogrel/Plavix 75 mg daily, levothyroxine 150 mg daily, Zetia 10 mg daily, NitroQuick 0.4 mg p.r.n., and aspirin 325 mg daily.  ALLERGIES:     NKDA.  PAST MEDICAL HISTORY:     Osteoarthritis in knees, ischemic cardiomyopathy of coronary artery disease, history of myocardial infarction, and hypercholesterolemia.  PAST SURGICAL HISTORY:     Right knee scope 2003, left knee scope 2005, heart stents 2005, myocardial infarction with additional stent 2009, and pacemaker/defibrillator placement November 2009.  ROS:    Ten point systemic review is obtained and is positive dentures, myocardial infarction, heart disease, and thyroid problems.  FAMILY HISTORY:    Positive for heart disease and heart attack in mother; diabetes, cancer, and stroke in father.  SOCIAL HISTORY:    The patient is a nonsmoker.  He does not drink any alcohol.  He is married and lives with his wife in a two-story residence.  He has one living child.  He is retired from the [employer].  EXAM:     The patient is seated in the examination room in no  acute distress.  He is alert, oriented, and appears appropriate age.  HT:  5'7"   WT:  170 pounds  BMI:  26.6  TEMP:  97.9 degrees  BP:  118/76    P:  72     R:  18  Skin:   No rashes, no lesions. HEENT:   PERRLA.  He does have partial upper dentures.  Neck is supple with good range of motion.   Chest:   Normal breath sounds.  Lungs clear to auscultation bilaterally.   Heart:   Regular rate and rhythm.  No signs of murmurs.   Abdomen:   Soft, non tender.  Active bowel sounds in all four quadrants. Rectal:    Not indicated for surgery. Neuro:   Cranial nerves grossly intact. Musculoskeletal:    Examination of the left lower extremity shows that he is neurovascularly intact.  He has tenderness to palpation over the joint line, positive patellofemoral crepitus.  Strength is good.  Hip range of motion is normal.    X-RAYS:     No images obtained today.  X-rays taken of the left knee on 11/12/11 show bone-on-bone osteoarthritis.  IMPRESSION:      1. Left knee osteoarthritis, end stage. 2. Ischemic cardiomyopathy. 3. Coronary artery disease. 4. Myocardial infarction with implantable cardioverter-defibrillator in place. 5. Hypercholesterolemia.  6. Hypothyroidism.  RECOMMENDATIONS:     The risks and benefits of left total knee arthroplasty were discussed again today and the patient wishes to proceed.  He has a preadmission appointment scheduled on 11/19/11 and surgery is scheduled for 11/27/11.  He does wish to go  home following the surgery and home health has been arranged with [place].  He has obtained medical clearance from his primary care physician, Dr. Lavone Orn, and his cardiologist, Dr. Peter Martinique.  They have recommended he come off his Plavix five days prior to surgery and continue on his aspirin daily.    He also has atrial defibrillator that was placed in 2009 by Dr. Rodell Perna.  The device is Medtronic, serial number [id number].  The rep should be present prior to surgery to  prepare the pacemaker.  He was given preoperative instructions.  We also discussed postoperative deep venous thrombosis prophylaxis and perioperative antibiotics.  He will be given Ancef and Lovenox.  The Lovenox was cleared with his cardiologist.  If he has any further questions or concerns prior to surgery, he may contact our office.  Due to his significant cardiac history, we will likely consult cardiology while inpatient.   Josh Anora Schwenke P.A.-C/[id number]  Auto-Authenticated by Baldo Ash P.A.-C

## 2011-11-27 NOTE — Progress Notes (Signed)
Patient ID: Johnny Oneill, male   DOB: 1941/02/14, 71 y.o.   MRN: [id number]     CARDIOLOGY CONSULT NOTE  Patient ID: Johnny Oneill MRN: [id number], DOB/AGE: 1941-05-04   Admit date: 11/27/2011 Date of Consult: 11/27/2011   Primary Physician: Pauline Good, MD, MD Primary Cardiologist: Peter Martinique MD  Pt. Profile  [name] is a 71 year old married white male who underwent total left knee replacement today. After he reached the floor, he developed nausea after taking a pain pill on an empty stomach followed by trying to eat some tomato soup and drank some ginger ale. He became pale his blood pressure dropped to 90 systolic. He responded to 300 cc of normal saline. He has a history of ischemic cardiomyopathy and is followed by Dr. Martinique.    Problem List  Past Medical History  Diagnosis Date  . Ischemic cardiomyopathy     a. 10/09 Echo: Sev LV Dysfxn, inf/lat AK, Mod MR.  [name] Hypercholesteremia   . Osteoarthritis     a. s/p   . Anxiety   . Hypothyroidism   . CAD (coronary artery disease)     a. s/p Lat MI 2009  . ICD (implantable cardiac defibrillator) in place     PROPHYLACTIC      medtronic  . RBBB   . 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 Hypertension   . PVC (premature ventricular contraction)   . Pacemaker     Past Surgical History  Procedure Date  . Defibrillator 2009  . Total knee arthroplasty 11/27/11    left  . Insert / replace / remove pacemaker 2009  . Coronary angioplasty with stent placement 2004  . Coronary angioplasty with stent placement 07/2008  . Knee arthroscopy w/ partial medial meniscectomy 12/2002    left  . Knee arthroscopy     bilaterally     Allergies  Allergies  Allergen Reactions  . Diovan (Valsartan) Other (See Comments)    Hypotension at low dose, hyperkalemia  . Lipitor (Atorvastatin Calcium) Other (See Comments)    Muscle pain    HPI   In addition to the above, patient did not have any chest tightness  or back discomfort as he had with his previous infarct. He did not get short of breath or breakout sweat. It only lasted for a few minutes.  He's had the identical thing happened to him before taking a hot shower.  His cardiac history, please see above. He has a biventricular pacemaker. An EKG was not obtained this event. On his last EKG was paced.  Inpatient Medications     . acetaminophen  1,000 mg Intravenous Q6H  . aspirin  325 mg Oral Daily  . carvedilol  12.5 mg Oral BID WC  .  ceFAZolin (ANCEF) IV  1 g Intravenous Q6H  .  ceFAZolin (ANCEF) IV  2 g Intravenous 60 min Pre-Op  . docusate sodium  100 mg Oral BID  . enoxaparin  30 mg Subcutaneous Q12H  . ezetimibe  10 mg Oral Daily  . levothyroxine  150 mcg Oral Daily  . rosuvastatin  20 mg Oral q1800  . sodium chloride  300 mL Intravenous Once  . DISCONTD: acetaminophen  1,000 mg Intravenous Q6H  . DISCONTD: chlorhexidine  60 mL Topical Once  . DISCONTD: simvastatin  40 mg Oral q1800    Family History Family History  Problem Relation Age of Onset  . Heart failure Father   .  Stroke Father   . Coronary artery disease Mother   . Heart disease Mother   . Hyperlipidemia Sister      Social History History   Social History  . Marital Status: Married    Spouse Name: N/A    Number of Children: 1  . Years of Education: N/A   Occupational History  . referee   . cleaning company    Social History Main Topics  . Smoking status: Never Smoker   . Smokeless tobacco: Never Used  . Alcohol Use: Yes     "occassionlly; last beer was 09/2011"  . Drug Use: Yes    Special: Marijuana     "back in our younger days we smoked a little pot"  . Sexually Active: Not Currently   Other Topics Concern  . Not on file   Social History Narrative  . No narrative on file     Review of Systems  General:  No chills, fever, night sweats or weight changes.  Cardiovascular:  No chest pain, dyspnea on exertion, edema, orthopnea,  palpitations, paroxysmal nocturnal dyspnea. Dermatological: No rash, lesions/masses Respiratory: No cough, dyspnea Urologic: No hematuria, dysuria Abdominal:   No nausea, vomiting, diarrhea, bright red blood per rectum, melena, or hematemesis Neurologic:  No visual changes, wkns, changes in mental status. All other systems reviewed and are otherwise negative except as noted above.  Physical Exam  Blood pressure 107/69, pulse 79, temperature 98.2 F (36.8 C), temperature source Oral, resp. rate 18, height 5\' 7"  (1.702 m), weight 169 lb 1.5 oz (76.7 kg), SpO2 93.00%.  General: Pleasant, NAD Psych: Normal affect. Neuro: Alert and oriented X 3. Moves all extremities spontaneously. HEENT: Normal  Neck: Supple without bruits or JVD. Lungs:  Resp regular and unlabored, CTA. Heart: RRR no s3, s4, or murmurs. Abdomen: Soft, non-tender, non-distended, BS + x 4.  Extremities: Left lower extremity wrapped in mechanical mobilizer. Pulses are intact. No edema.  Labs  No results found for this basename: CKTOTAL:4,CKMB:4,TROPONINI:4 in the last 72 hours Lab Results  Component Value Date   WBC 6.4 11/19/2011   HGB 15.1 11/19/2011   HCT 44.3 11/19/2011   MCV 89.1 11/19/2011   PLT 157 11/19/2011   No results found for this basename: NA,K,CL,CO2,BUN,CREATININE,CALCIUM,LABALBU,PROT,BILITOT,ALKPHOS,ALT,AST,GLUCOSE in the last 168 hours Lab Results  Component Value Date   CHOL 99 06/01/2011   HDL 44.50 06/01/2011   LDLCALC 39 06/01/2011   TRIG 78.0 06/01/2011   No results found for this basename: DDIMER    Radiology/Studies  Dg Chest 2 View  11/19/2011  *RADIOLOGY REPORT*  Clinical Data: Preop left total knee replacement  CHEST - 2 VIEW  Comparison: 07/04/2008  Findings: Lungs are essentially clear. No pleural effusion or pneumothorax.  Cardiomediastinal silhouette is within normal limits.  Stable left subclavian ICD.  While the left ventricular lead appears mildly looped on the frontal view, this is  projectional, as it is normal on the lateral view.  Mild degenerative changes of the visualized thoracolumbar spine.  IMPRESSION: No evidence of acute cardiopulmonary disease.  Original Report Authenticated By: Julian Hy, M.D.   X-ray Knee Left Port  11/27/2011  *RADIOLOGY REPORT*  Clinical Data: Status post arthroplasty.  PORTABLE LEFT KNEE - 1-2 VIEW  Comparison: No priors.  Findings: AP lateral views of the left knee demonstrate postoperative changes of total knee arthroplasty.  The femoral and tibial components of the prosthesis appear well seated.  A small amount of gas is noted within the joint space and the  overlying soft tissues.  A line of surgical staples are seen anterior to the knee joint, and a surgical drain is in place within the joint.  No acute complicating features are identified.  Alignment appears anatomic.  IMPRESSION: 1.  Postoperative changes of left total knee arthroplasty, as above, without acute complicating features.  Original Report Authenticated By: Etheleen Mayhew, M.D.    ECG    ASSESSMENT AND PLAN #1-vasovagal reaction secondary surgery and  anesthesia, n.p.o., taking a pain pill on empty stomach, and eating soup and ginger ale. Symptoms not consistent with previous cardiac ischemic event.  Reassurance given to the patient and his wife was at the bedside. Medications renewed and will give normal saline 75 cc an hour. We'll not check cardiac markers.    Signed, Jenell Milliner, MD 11/27/2011, 6:33 PM

## 2011-11-27 NOTE — Anesthesia Preprocedure Evaluation (Addendum)
Anesthesia Evaluation  Patient identified by MRN, date of birth, ID band Patient awake    Reviewed: Allergy & Precautions, H&P , NPO status , Patient's Chart, lab work & pertinent test results  Airway Mallampati: I TM Distance: >3 FB Neck ROM: full    Dental  (+) Teeth Intact   Pulmonary          Cardiovascular Exercise Tolerance: Good hypertension, Pt. on home beta blockers + CAD and + Past MI + dysrhythmias + Cardiac Defibrillator Rhythm:regular Rate:Normal     Neuro/Psych    GI/Hepatic   Endo/Other  Hypothyroidism   Renal/GU      Musculoskeletal   Abdominal   Peds  Hematology   Anesthesia Other Findings   Reproductive/Obstetrics                         Anesthesia Physical Anesthesia Plan  ASA: III  Anesthesia Plan: General and Regional   Post-op Pain Management:    Induction: Intravenous  Airway Management Planned: LMA and Oral ETT  Additional Equipment:   Intra-op Plan:   Post-operative Plan: Extubation in OR  Informed Consent: I have reviewed the patients History and Physical, chart, labs and discussed the procedure including the risks, benefits and alternatives for the proposed anesthesia with the patient or authorized representative who has indicated his/her understanding and acceptance.   Dental advisory given  Plan Discussed with: Anesthesiologist and Surgeon  Anesthesia Plan Comments:        Anesthesia Quick Evaluation

## 2011-11-28 LAB — BASIC METABOLIC PANEL
BUN: 21 mg/dL (ref 6–23)
CO2: 26 mEq/L (ref 19–32)
Calcium: 8.5 mg/dL (ref 8.4–10.5)
Chloride: 102 mEq/L (ref 96–112)
Creatinine, Ser: 1.13 mg/dL (ref 0.50–1.35)
GFR calc Af Amer: 75 mL/min — ABNORMAL LOW (ref >90)
GFR calc non Af Amer: 65 mL/min — ABNORMAL LOW (ref >90)
Glucose, Bld: 165 mg/dL — ABNORMAL HIGH (ref 70–99)
Potassium: 4 mEq/L (ref 3.5–5.1)
Sodium: 138 mEq/L (ref 135–145)

## 2011-11-28 LAB — CBC
HCT: 36.6 % — ABNORMAL LOW (ref 39.0–52.0)
Hemoglobin: 12.3 g/dL — ABNORMAL LOW (ref 13.0–17.0)
MCH: 30 pg (ref 26.0–34.0)
MCHC: 33.6 g/dL (ref 30.0–36.0)
MCV: 89.3 fL (ref 78.0–100.0)
Platelets: 140 10*3/uL — ABNORMAL LOW (ref 150–400)
RBC: 4.1 MIL/uL — ABNORMAL LOW (ref 4.22–5.81)
RDW: 13.9 % (ref 11.5–15.5)
WBC: 9.7 10*3/uL (ref 4.0–10.5)

## 2011-11-28 NOTE — Progress Notes (Signed)
CSW received consult for SNF. PT recommendation for HHPT/DME noted. No other CSW needs identified. Please re-consult if SNF needed. CSW signing off. Wandra Feinstein, MSW, [place] (weekend)

## 2011-11-28 NOTE — Progress Notes (Signed)
PT Cancellation Note  Treatment cancelled today due to patient's refusal to participate. Patient recently finished treatment with Occupational Therapy and was in a lot of pain, declined treatment this afternoon. Will resume treatment tomorrow as planned. Thanks, 11/28/2011 Ambrose Finland DPT PAGER: [phone] OFFICE: [phone]    Ambrose Finland 11/28/2011, 5:32 PM

## 2011-11-28 NOTE — Evaluation (Signed)
Physical Therapy Evaluation Patient Details Name: Johnny Oneill MRN: [id number] DOB: 06/19/1941 Today's Date: 11/28/2011  Problem List:  Patient Active Problem List  Diagnoses  . Ischemic cardiomyopathy  . Myocardial infarction of lateral wall  . Hypercholesteremia  . ICD (implantable cardiac defibrillator) in place  . CAD (coronary artery disease)  . Osteoarthritis  . Essential hypertension, benign  . ISCHEMIC CARDIOMYOPATHY  . Chronic systolic heart failure    Past Medical History:  Past Medical History  Diagnosis Date  . Ischemic cardiomyopathy     a. 10/09 Echo: Sev LV Dysfxn, inf/lat AK, Mod MR.  [name] Hypercholesteremia   . Osteoarthritis     a. s/p   . Anxiety   . Hypothyroidism   . CAD (coronary artery disease)     a. s/p Lat MI 2009  . ICD (implantable cardiac defibrillator) in place     PROPHYLACTIC      medtronic  . RBBB   . 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 Hypertension   . PVC (premature ventricular contraction)   . Pacemaker    Past Surgical History:  Past Surgical History  Procedure Date  . Defibrillator 2009  . Total knee arthroplasty 11/27/11    left  . Insert / replace / remove pacemaker 2009  . Coronary angioplasty with stent placement 2004  . Coronary angioplasty with stent placement 07/2008  . Knee arthroscopy w/ partial medial meniscectomy 12/2002    left  . Knee arthroscopy     bilaterally    PT Assessment/Plan/Recommendation PT Assessment Clinical Impression Statement: Pt s/p L TKA moving very well but limited by hypotension. EOB BP116/73 and with amb dropped to78/52. Pt able to recognize decline in pressure and sit promptly in chair. Pt will heel roll end of session and HEP provided. Will continue to follow to maximize strength, ROM, mobilty and gait before return home. PT Recommendation/Assessment: Patient will need skilled PT in the acute care venue PT Problem List: Decreased strength;Decreased range of  motion;Decreased activity tolerance;Decreased knowledge of use of DME;Decreased mobility;Decreased knowledge of precautions;Cardiopulmonary status limiting activity Barriers to Discharge: None PT Therapy Diagnosis : Difficulty walking;Abnormality of gait PT Plan PT Frequency: 7X/week PT Treatment/Interventions: Gait training;DME instruction;Stair training;Functional mobility training;Therapeutic activities;Therapeutic exercise;Patient/family education PT Recommendation Recommendations for Other Services: OT consult Follow Up Recommendations: Home health PT Equipment Recommended: Rolling walker with 5" wheels PT Goals  Acute Rehab PT Goals PT Goal Formulation: With patient Time For Goal Achievement: 7 days Pt will go Supine/Side to Sit: with modified independence PT Goal: Supine/Side to Sit - Progress: Goal set today Pt will go Sit to Supine/Side: with modified independence PT Goal: Sit to Supine/Side - Progress: Goal set today Pt will go Sit to Stand: with modified independence PT Goal: Sit to Stand - Progress: Goal set today Pt will go Stand to Sit: with modified independence PT Goal: Stand to Sit - Progress: Goal set today Pt will Ambulate: >150 feet;with modified independence;with least restrictive assistive device PT Goal: Ambulate - Progress: Goal set today Pt will Go Up / Down Stairs: 3-5 stairs;with min assist;with least restrictive assistive device PT Goal: Up/Down Stairs - Progress: Goal set today Pt will Perform Home Exercise Program: with supervision, verbal cues required/provided PT Goal: Perform Home Exercise Program - Progress: Goal set today  PT Evaluation Precautions/Restrictions  Precautions Precautions: Knee Restrictions Weight Bearing Restrictions: Yes LLE Weight Bearing: Weight bearing as tolerated Prior Functioning  Home Living Lives With: Spouse  Type of Home: House Home Layout: Two level;Able to live on main level with bedroom/bathroom Home Access:  Stairs to enter Entrance Stairs-Rails: None Entrance Stairs-Number of Steps: 3 Bathroom Shower/Tub: Tub only;Walk-in shower (shower is upstairs) Home Adaptive Equipment: None Prior Function Level of Independence: Independent with basic ADLs;Independent with transfers;Independent with homemaking with ambulation;Independent with gait Driving: Yes Vocation: Full time employment Comments: pt is a high school and college basketball ref Cognition Cognition Arousal/Alertness: Awake/alert Overall Cognitive Status: Appears within functional limits for tasks assessed Orientation Level: Oriented X4 Sensation/Coordination Sensation Light Touch: Not tested Extremity Assessment RLE Assessment RLE Assessment: Within Functional Limits LLE Assessment LLE Assessment: Exceptions to [facility] LLE Strength LLE Overall Strength Comments: limited secondary to sx Mobility (including Balance) Bed Mobility Bed Mobility: Yes Supine to Sit: 5: Supervision;With rails;HOB elevated (Comment degrees) (HOB 15degrees) Supine to Sit Details (indicate cue type and reason): cueing to hook RLE under LLE for use of control and manipulation to EOB Sitting - Scoot to Edge of Bed: 5: Supervision Sitting - Scoot to Edge of Bed Details (indicate cue type and reason): cueing to sequence Transfers Transfers: Yes Sit to Stand: 4: Min assist;From bed Sit to Stand Details (indicate cue type and reason): cueing to hand placement and sequence Stand to Sit: 4: Min assist;To chair/3-in-1;With armrests Stand to Sit Details: cueing for hand placement and LLE positioning to sit Ambulation/Gait Ambulation/Gait: Yes Ambulation/Gait Assistance: 4: Min assist Ambulation/Gait Assistance Details (indicate cue type and reason): minguard secondary to pt recent hypotension. Ambulation limited secondary to BP drop, +1 to follow with chair Ambulation Distance (Feet): 15 Feet Assistive device: Rolling walker Gait Pattern: Step-to  pattern;Decreased stance time - left  Posture/Postural Control Posture/Postural Control: No significant limitations Exercise  Total Joint Exercises Quad Sets: AROM;Left;5 reps;Supine Short Arc Quad: AAROM;Left;5 reps;Seated Heel Slides: AROM;Left;5 reps;Supine End of Session PT - End of Session Equipment Utilized During Treatment: Gait belt Activity Tolerance: Treatment limited secondary to medical complications (Comment) Patient left: in chair;with call bell in reach;with family/visitor present Nurse Communication: Mobility status for transfers;Mobility status for ambulation  Melford Aase 11/28/2011, 10:20 AM  Elwyn Reach, [place]

## 2011-11-28 NOTE — Progress Notes (Signed)
Occupational Therapy Evaluation Patient Details Name: Johnny Oneill MRN: [id number] DOB: 11-14-1940 Today's Date: 11/28/2011  Problem List:  Patient Active Problem List  Diagnoses  . Ischemic cardiomyopathy  . Myocardial infarction of lateral wall  . Hypercholesteremia  . ICD (implantable cardiac defibrillator) in place  . CAD (coronary artery disease)  . Osteoarthritis  . Essential hypertension, benign  . ISCHEMIC CARDIOMYOPATHY  . Chronic systolic heart failure    Past Medical History:  Past Medical History  Diagnosis Date  . Ischemic cardiomyopathy     a. 10/09 Echo: Sev LV Dysfxn, inf/lat AK, Mod MR.  [name] Hypercholesteremia   . Osteoarthritis     a. s/p   . Anxiety   . Hypothyroidism   . CAD (coronary artery disease)     a. s/p Lat MI 2009  . ICD (implantable cardiac defibrillator) in place     PROPHYLACTIC      medtronic  . RBBB   . 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 Hypertension   . PVC (premature ventricular contraction)   . Pacemaker    Past Surgical History:  Past Surgical History  Procedure Date  . Defibrillator 2009  . Total knee arthroplasty 11/27/11    left  . Insert / replace / remove pacemaker 2009  . Coronary angioplasty with stent placement 2004  . Coronary angioplasty with stent placement 07/2008  . Knee arthroscopy w/ partial medial meniscectomy 12/2002    left  . Knee arthroscopy     bilaterally    OT Assessment/Plan/Recommendation OT Assessment Clinical Impression Statement: Pt is a 71 yo s/p TKA LLE, with past cardiac history. Pt will benefit from skilled OT services secondary to the deficits listed below to return to pts PLOF with [facility] AE adn DME. Wife will be able to assist as needed. Made rec for AE to increase indep with ADL. Will see additional visit to educate on tub transfers. OT Recommendation/Assessment: Patient will need skilled OT in the acute care venue OT Problem List: Decreased strength;Decreased range of  motion;Decreased activity tolerance;Impaired balance (sitting and/or standing);Decreased knowledge of use of DME or AE;Decreased knowledge of precautions;Pain Barriers to Discharge: None OT Therapy Diagnosis : Generalized weakness;Acute pain OT Plan OT Frequency: Min 2X/week OT Treatment/Interventions: Self-care/ADL training;Energy conservation;DME and/or AE instruction;Therapeutic activities;Patient/family education OT Recommendation Follow Up Recommendations: No OT follow up Equipment Recommended: 3 in 1 bedside comode;Tub/shower bench Individuals Consulted Consulted and Agree with Results and Recommendations: Patient OT Goals Acute Rehab OT Goals OT Goal Formulation: With patient/family Time For Goal Achievement: 7 days ADL Goals Pt Will Transfer to Toilet: with modified independence;Ambulation;3-in-1;with DME ADL Goal: Toilet Transfer - Progress: Goal set today Pt Will Perform Toileting - Clothing Manipulation: with modified independence;Standing ADL Goal: Toileting - Clothing Manipulation - Progress: Goal set today Pt Will Perform Tub/Shower Transfer: with supervision;Ambulation;with DME;Transfer tub bench ADL Goal: Tub/Shower Transfer - Progress: Goal set today  OT Evaluation Precautions/Restrictions  Precautions Precautions: Knee Restrictions Weight Bearing Restrictions: Yes LLE Weight Bearing: Weight bearing as tolerated Prior Functioning Home Living Lives With: Spouse Type of Home: House Home Layout: Two level;Able to live on main level with bedroom/bathroom Home Access: Stairs to enter Entrance Stairs-Rails: None Entrance Stairs-Number of Steps: 3 Bathroom Shower/Tub: Tub only;Walk-in shower (shower is upstairs) Armed forces training and education officer: Yes How Accessible: Accessible via walker Home Adaptive Equipment: None Prior Function Level of Independence: Independent with basic ADLs;Independent with transfers;Independent with homemaking with  ambulation;Independent with gait  Able to Take Stairs?: Yes Driving: Yes Vocation: Full time employment ADL ADL Eating/Feeding: Performed;Independent Where Assessed - Eating/Feeding: Chair Grooming: Set up;Performed Where Assessed - Grooming: Sitting, chair;Supported Upper Body Bathing: Simulated;Set up Where Assessed - Upper Body Bathing: Sitting, chair Lower Body Bathing: Simulated;Moderate assistance Where Assessed - Lower Body Bathing: Sit to stand from chair;Supported Artist Dressing: Set up;Simulated Where Assessed - Upper Body Dressing: Sitting, bed Lower Body Dressing: Simulated;Maximal assistance Where Assessed - Lower Body Dressing: Sit to stand from bed;Supported Toilet Transfer: Simulated;Minimal assistance Toilet Transfer Method: Counselling psychologist: Bedside commode Toileting - Clothing Manipulation: Simulated;Minimal assistance Where Assessed - Camera operator Manipulation: Standing Toileting - Hygiene: Simulated;Supervision/safety Where [place]: Standing Tub/Shower Transfer: Not assessed;Other (comment) (pt asked to do this tomorrow) Equipment Used: Reacher;Long-handled sponge;Long-handled shoe horn;Rolling walker;Sock aid Ambulation Related to ADLs: Min A short distance due to decrease in BP. ADL Comments: wife states that she will assist as needed. Vision/Perception  Vision - History Baseline Vision: Wears glasses all the time Perception Perception: Within Functional Limits Praxis Praxis: Intact Cognition Cognition Arousal/Alertness: Awake/alert Overall Cognitive Status: Appears within functional limits for tasks assessed Orientation Level: Oriented X4 Sensation/Coordination Sensation Light Touch: Appears Intact Coordination Gross Motor Movements are Fluid and Coordinated: Yes Fine Motor Movements are Fluid and Coordinated: Yes Extremity Assessment RUE Assessment RUE Assessment: Within Functional Limits LUE  Assessment LUE Assessment: Within Functional Limits Mobility  Bed Mobility Bed Mobility: Yes Supine to Sit: 5: Supervision Sitting - Scoot to Edge of Bed: 5: Supervision Transfers Transfers: Yes Sit to Stand: 4: Min assist;From bed Stand to Sit: 4: Min assist;To chair/3-in-1;With armrests Exercises   End of Session OT - End of Session Equipment Utilized During Treatment: Gait belt Activity Tolerance: Patient tolerated treatment well Patient left: in chair Nurse Communication: Mobility status for transfers General Behavior During Session: [facility] for tasks performed Cognition: [facility] for tasks performed   [facility] 11/28/2011, 5:15 PM  [facility], OTR/L  [phone] 11/28/2011

## 2011-11-28 NOTE — Progress Notes (Signed)
Patient ID: Johnny Oneill, male   DOB: 19-Aug-1941, 71 y.o.   MRN: [id number]     1 Day Post-Op   Subjective:  Patient reports pain as mild to moderate.  He states that he is having some light headedness with ambulation.  He denies any CP or SOB.  He does have some mild nausea.  Objective:   VITALS:   Filed Vitals:   11/28/11 0911 11/28/11 0916 11/28/11 0919 11/28/11 1200  BP: 116/73 78/52 98/67  113/65  Pulse: 117   103  Temp:    97.3 F (36.3 C)  TempSrc:    Oral  Resp:    22  Height:      Weight:      SpO2:    97%    Sensation intact distally Dorsiflexion/Plantar flexion intact Incision: dressing C/D/I and no drainage.   Drain intact and active  LABS Lab Results  Component Value Date   HGB 12.3* 11/28/2011   HGB 15.1 11/19/2011   HGB 15.7 10/11/2007   CBC    Component Value Date/Time   WBC 9.7 11/28/2011 0500   RBC 4.10* 11/28/2011 0500   HGB 12.3* 11/28/2011 0500   HCT 36.6* 11/28/2011 0500   PLT 140* 11/28/2011 0500   MCV 89.3 11/28/2011 0500   MCH 30.0 11/28/2011 0500   MCHC 33.6 11/28/2011 0500   RDW 13.9 11/28/2011 0500   LYMPHSABS 1.8 11/19/2011 1258   MONOABS 0.4 11/19/2011 1258   EOSABS 0.2 11/19/2011 1258   BASOSABS 0.0 11/19/2011 1258    Lab Results  Component Value Date   INR 1.07 11/19/2011   Lab Results  Component Value Date   NA 138 11/28/2011   K 4.0 11/28/2011   CL 102 11/28/2011   CO2 26 11/28/2011   BUN 21 11/28/2011   CREATININE 1.13 11/28/2011   GLUCOSE 165* 11/28/2011   Dg Chest 2 View  11/19/2011  *RADIOLOGY REPORT*  Clinical Data: Preop left total knee replacement  CHEST - 2 VIEW  Comparison: 07/04/2008  Findings: Lungs are essentially clear. No pleural effusion or pneumothorax.  Cardiomediastinal silhouette is within normal limits.  Stable left subclavian ICD.  While the left ventricular lead appears mildly looped on the frontal view, this is projectional, as it is normal on the lateral view.  Mild degenerative changes of the visualized  thoracolumbar spine.  IMPRESSION: No evidence of acute cardiopulmonary disease.  Original Report Authenticated By: Julian Hy, M.D.   X-ray Knee Left Port  11/27/2011  *RADIOLOGY REPORT*  Clinical Data: Status post arthroplasty.  PORTABLE LEFT KNEE - 1-2 VIEW  Comparison: No priors.  Findings: AP lateral views of the left knee demonstrate postoperative changes of total knee arthroplasty.  The femoral and tibial components of the prosthesis appear well seated.  A small amount of gas is noted within the joint space and the overlying soft tissues.  A line of surgical staples are seen anterior to the knee joint, and a surgical drain is in place within the joint.  No acute complicating features are identified.  Alignment appears anatomic.  IMPRESSION: 1.  Postoperative changes of left total knee arthroplasty, as above, without acute complicating features.  Original Report Authenticated By: Etheleen Mayhew, M.D.    Assessment/Plan: Principal Problem:  *Osteoarthritis Active Problems:  Ischemic cardiomyopathy  Hypercholesteremia  ICD (implantable cardiac defibrillator) in place  CAD (coronary artery disease)  Essential hypertension, benign  Chronic systolic heart failure   Advance diet Up with therapy Plan to DC hemovac drain tomorrow.  Plan dressing change tomorrow.  [name] 11/28/2011, 12:40 PM

## 2011-11-28 NOTE — Progress Notes (Signed)
Patient ID: MCKINSEY DIKES, male   DOB: May 24, 1941, 71 y.o.   MRN: [id number]   Patient Name: Johnny Oneill Date of Encounter: 11/28/2011    SUBJECTIVE  Feeling better this morning with no nausea. Drinking fluids. Input +600 yesterday. Still on IV fluids. When he got up this morning with physical therapy his blood pressure dropped began. He felt lightheaded. Heart rate increases with minimal activity. Is currently V Paced  CURRENT MEDS    . acetaminophen  1,000 mg Intravenous Q6H  . aspirin  325 mg Oral Daily  . carvedilol  12.5 mg Oral BID WC  .  ceFAZolin (ANCEF) IV  1 g Intravenous Q6H  .  ceFAZolin (ANCEF) IV  2 g Intravenous 60 min Pre-Op  . docusate sodium  100 mg Oral BID  . enoxaparin  30 mg Subcutaneous Q12H  . ezetimibe  10 mg Oral Daily  . levothyroxine  150 mcg Oral Daily  . rosuvastatin  20 mg Oral q1800  . sodium chloride  300 mL Intravenous Once  . DISCONTD: acetaminophen  1,000 mg Intravenous Q6H  . DISCONTD: chlorhexidine  60 mL Topical Once  . DISCONTD: simvastatin  40 mg Oral q1800    OBJECTIVE  Filed Vitals:   11/28/11 0800 11/28/11 0911 11/28/11 0916 11/28/11 0919  BP: 112/70 116/73 78/52 98/67   Pulse: 114 117    Temp: 98.5 F (36.9 C)     TempSrc: Oral     Resp: 24     Height:      Weight:      SpO2: 96%       Intake/Output Summary (Last 24 hours) at 11/28/11 1011 Last data filed at 11/28/11 0900  Gross per 24 hour  Intake   2540 ml  Output   1820 ml  Net    720 ml   Filed Weights   11/27/11 1718 11/28/11 0400  Weight: 169 lb 1.5 oz (76.7 kg) 180 lb (81.647 kg)    PHYSICAL EXAM  General: Pleasant, NAD. Neuro: Alert and oriented X 3. Moves all extremities spontaneously. Psych: Normal affect. HEENT:  Normal  Neck: Supple without bruits or JVD. Lungs:  Resp regular and unlabored, CTA. Heart: RRR no s3, s4, or murmurs. Abdomen: Soft, non-tender, non-distended, BS + x 4.  Extremities: No clubbing, cyanosis or edema. DP/PT/Radials 2+  and equal bilaterally.  Accessory Clinical Findings  CBC  Basename 11/28/11 0500  WBC 9.7  NEUTROABS --  HGB 12.3*  HCT 36.6*  MCV 89.3  PLT [id number]*   Basic Metabolic Panel  Basename [id number] 0500  NA 138  K 4.0  CL 102  CO2 26  GLUCOSE 165*  BUN 21  CREATININE 1.13  CALCIUM 8.5  MG --  PHOS --   Liver Function Tests No results found for this basename: AST:2,ALT:2,ALKPHOS:2,BILITOT:2,PROT:2,ALBUMIN:2 in the last 72 hours No results found for this basename: LIPASE:2,AMYLASE:2 in the last 72 hours Cardiac Enzymes No results found for this basename: CKTOTAL:3,CKMB:3,CKMBINDEX:3,TROPONINI:3 in the last 72 hours BNP No components found with this basename: POCBNP:3 D-Dimer No results found for this basename: DDIMER:2 in the last 72 hours Hemoglobin A1C No results found for this basename: HGBA1C in the last 72 hours Fasting Lipid Panel No results found for this basename: CHOL,HDL,LDLCALC,TRIG,CHOLHDL,LDLDIRECT in the last 72 hours Thyroid Function Tests No results found for this basename: TSH,T4TOTAL,FREET3,T3FREE,THYROIDAB in the last 72 hours  TELE NSR, V Paced  ECG    Radiology/Studies  Dg Chest 2 View  11/19/2011  *RADIOLOGY REPORT*  Clinical Data: Preop left total knee replacement  CHEST - 2 VIEW  Comparison: 07/04/2008  Findings: Lungs are essentially clear. No pleural effusion or pneumothorax.  Cardiomediastinal silhouette is within normal limits.  Stable left subclavian ICD.  While the left ventricular lead appears mildly looped on the frontal view, this is projectional, as it is normal on the lateral view.  Mild degenerative changes of the visualized thoracolumbar spine.  IMPRESSION: No evidence of acute cardiopulmonary disease.  Original Report Authenticated By: Julian Hy, M.D.   X-ray Knee Left Port  11/27/2011  *RADIOLOGY REPORT*  Clinical Data: Status post arthroplasty.  PORTABLE LEFT KNEE - 1-2 VIEW  Comparison: No priors.  Findings: AP lateral  views of the left knee demonstrate postoperative changes of total knee arthroplasty.  The femoral and tibial components of the prosthesis appear well seated.  A small amount of gas is noted within the joint space and the overlying soft tissues.  A line of surgical staples are seen anterior to the knee joint, and a surgical drain is in place within the joint.  No acute complicating features are identified.  Alignment appears anatomic.  IMPRESSION: 1.  Postoperative changes of left total knee arthroplasty, as above, without acute complicating features.  Original Report Authenticated By: Etheleen Mayhew, M.D.    ASSESSMENT AND PLAN  Principal Problem:  *Osteoarthritis Active Problems:  Ischemic cardiomyopathy  Hypercholesteremia  ICD (implantable cardiac defibrillator) in place  CAD (coronary artery disease)  Essential hypertension, benign  Chronic systolic heart failure    He is stable. Decrease IV fluids to 50 cc an hour. Encouraged continued during clear fluids. We'll follow very closely volume with a systolic heart failure. No change in medications today.  Signed, Jenell Milliner MD

## 2011-11-29 LAB — CBC
HCT: 27.8 % — ABNORMAL LOW (ref 39.0–52.0)
Hemoglobin: 9.3 g/dL — ABNORMAL LOW (ref 13.0–17.0)
MCH: 29.9 pg (ref 26.0–34.0)
MCHC: 33.5 g/dL (ref 30.0–36.0)
MCV: 89.4 fL (ref 78.0–100.0)
Platelets: 122 10*3/uL — ABNORMAL LOW (ref 150–400)
RBC: 3.11 MIL/uL — ABNORMAL LOW (ref 4.22–5.81)
RDW: 14.2 % (ref 11.5–15.5)
WBC: 9.8 10*3/uL (ref 4.0–10.5)

## 2011-11-29 NOTE — Progress Notes (Signed)
   CARE MANAGEMENT NOTE 11/29/2011  Patient:  Johnny Oneill, Johnny Oneill   Account Number:  [id number]  Date Initiated:  11/29/2011  Documentation initiated by:  [facility]  Subjective/Objective Assessment:   left TKR     Action/Plan:   lives at home with wife   Anticipated DC Date:  12/01/2011   Anticipated DC Plan:  [place]  CM consult      [facility] Choice  HOME HEALTH   Choice offered to / List presented to:  C-1 Patient        [place] arranged  HH-2 PT      [place]   Status of service:  Completed, signed off Medicare Important Message given?   (If response is "NO", the following Medicare IM given date fields will be blank) Date Medicare IM given:   Date Additional Medicare IM given:    Discharge Disposition:  [place]  Per UR Regulation:    If discussed at Long Length of Stay Meetings, dates discussed:    Comments:  11/29/2011 1000 Spoke to pt and states he has all his DME at home from [employer] such as [employer]. Will discuss with [employer] about getting shower bench. Rep from [place] requested order and op report. Faxed to [place], pts orders, Facesheet, f21f and op notes. Jonnie Finner RN CCM Case Mgmt phone [phone]

## 2011-11-29 NOTE — Progress Notes (Signed)
Patient ID: Johnny Oneill, male   DOB: [birth year], 71 y.o.   MRN: [id number]     2 Days Post-Op   Subjective:  Patient reports pain as mild to moderate.  He states that he is doing some better his biggest complaint is that his leg feels so heavy.  Denies being light headed today also denies CP or SOB  Objective:   VITALS:   Filed Vitals:   11/28/11 1200 11/28/11 1600 11/28/11 2100 11/29/11 0500  BP: 113/65 106/63 113/65 101/68  Pulse: 103 107 119 113  Temp: 97.3 F (36.3 C) 97.4 F (36.3 C) 99.6 F (37.6 C) 99.3 F (37.4 C)  TempSrc: Oral Oral Oral Oral  Resp: 22 20 18 18   Height:      Weight:      SpO2: 97% 92% 95% 93%   Hemovac drain in place and removed at the bedside. ABD soft Sensation intact distally Dorsiflexion/Plantar flexion intact Incision: dressing C/D/I and no drainage  LABS Lab Results  Component Value Date   HGB 9.3* 11/29/2011   HGB 12.3* 11/28/2011   HGB 15.1 11/19/2011   CBC    Component Value Date/Time   WBC 9.8 11/29/2011 0547   RBC 3.11* 11/29/2011 0547   HGB 9.3* 11/29/2011 0547   HCT 27.8* 11/29/2011 0547   PLT 122* 11/29/2011 0547   MCV 89.4 11/29/2011 0547   MCH 29.9 11/29/2011 0547   MCHC 33.5 11/29/2011 0547   RDW 14.2 11/29/2011 0547   LYMPHSABS 1.8 11/19/2011 1258   MONOABS 0.4 11/19/2011 1258   EOSABS 0.2 11/19/2011 1258   BASOSABS 0.0 11/19/2011 1258    Lab Results  Component Value Date   INR 1.07 11/19/2011   Lab Results  Component Value Date   NA 138 11/28/2011   K 4.0 11/28/2011   CL 102 11/28/2011   CO2 26 11/28/2011   BUN 21 11/28/2011   CREATININE 1.13 11/28/2011   GLUCOSE 165* 11/28/2011   Dg Chest 2 View  11/19/2011  *RADIOLOGY REPORT*  Clinical Data: Preop left total knee replacement  CHEST - 2 VIEW  Comparison: 07/04/2008  Findings: Lungs are essentially clear. No pleural effusion or pneumothorax.  Cardiomediastinal silhouette is within normal limits.  Stable left subclavian ICD.  While the left ventricular lead appears mildly  looped on the frontal view, this is projectional, as it is normal on the lateral view.  Mild degenerative changes of the visualized thoracolumbar spine.  IMPRESSION: No evidence of acute cardiopulmonary disease.  Original Report Authenticated By: Julian Hy, M.D.   X-ray Knee Left Port  11/27/2011  *RADIOLOGY REPORT*  Clinical Data: Status post arthroplasty.  PORTABLE LEFT KNEE - 1-2 VIEW  Comparison: No priors.  Findings: AP lateral views of the left knee demonstrate postoperative changes of total knee arthroplasty.  The femoral and tibial components of the prosthesis appear well seated.  A small amount of gas is noted within the joint space and the overlying soft tissues.  A line of surgical staples are seen anterior to the knee joint, and a surgical drain is in place within the joint.  No acute complicating features are identified.  Alignment appears anatomic.  IMPRESSION: 1.  Postoperative changes of left total knee arthroplasty, as above, without acute complicating features.  Original Report Authenticated By: Etheleen Mayhew, M.D.    Assessment/Plan: Principal Problem:  *Osteoarthritis Active Problems:  Ischemic cardiomyopathy  Hypercholesteremia  ICD (implantable cardiac defibrillator) in place  CAD (coronary artery disease)  Essential hypertension, benign  Chronic systolic heart failure   Advance diet Up with therapy Plan for discharge tomorrow per Dr Lorenz Coaster Hemovac drain DC'd today. Transfer to 5000.   [name] 11/29/2011, 9:51 AM

## 2011-11-29 NOTE — Progress Notes (Signed)
Physical Therapy Treatment Patient Details Name: Johnny Oneill MRN: [id number] DOB: May 29, 1941 Today's Date: 11/29/2011  PT Assessment/Plan  PT - Assessment/Plan Comments on Treatment Session: Pt progressing well. Still limited by pain and decreased endurance. Will continue per plan this afternoon PT Plan: Discharge plan remains appropriate;Frequency remains appropriate PT Frequency: 7X/week Follow Up Recommendations: Home health PT Equipment Recommended: 3 in 1 bedside comode;Tub/shower bench;Rolling walker with 5" wheels PT Goals  Acute Rehab PT Goals PT Goal Formulation: With patient PT Goal: Supine/Side to Sit - Progress: Progressing toward goal PT Goal: Sit to Supine/Side - Progress: Progressing toward goal PT Goal: Sit to Stand - Progress: Progressing toward goal PT Goal: Stand to Sit - Progress: Progressing toward goal PT Goal: Ambulate - Progress: Progressing toward goal PT Goal: Perform Home Exercise Program - Progress: Progressing toward goal  PT Treatment Precautions/Restrictions  Precautions Precautions: Knee Restrictions Weight Bearing Restrictions: Yes LLE Weight Bearing: Weight bearing as tolerated Mobility (including Balance) Bed Mobility Bed Mobility: Yes Supine to Sit: 4: Min assist Supine to Sit Details (indicate cue type and reason): Min assist with LLE secondary to pain. VC for sequencing Sitting - Scoot to Edge of Bed: 6: Modified independent (Device/Increase time) Sit to Supine: 4: Min assist Sit to Supine - Details (indicate cue type and reason): Min assist with LLE. VC for sequencing  Transfers Transfers: Yes Sit to Stand: 4: Min assist;From bed Sit to Stand Details (indicate cue type and reason): VC for hand palcement for safety to RW. Min assist for stability into standing Stand to Sit: 4: Min assist;With upper extremity assist;To bed Stand to Sit Details: VC for hand placement and sequencing with LLE for comfort upon sitting. Pt able to control  descent Ambulation/Gait Ambulation/Gait: Yes Ambulation/Gait Assistance: 5: Supervision Ambulation/Gait Assistance Details (indicate cue type and reason): VC for distance to RW as well as proper sequencing and even step length. No BP changes this session Ambulation Distance (Feet): 20 Feet (to/from bathroom) Assistive device: Rolling walker Gait Pattern: Step-to pattern;Decreased stance time - left Gait velocity: decreased gait speed Stairs: No  Balance Balance Assessed: Yes Static Standing Balance Static Standing - Balance Support: Bilateral upper extremity supported Static Standing - Level of Assistance: 5: Stand by assistance Static Standing - Comment/# of Minutes: SBA for supervision during bathroom and hygiene Exercise  Total Joint Exercises Quad Sets: AROM;Left;Supine;10 reps Hip ABduction/ADduction: AAROM;Strengthening;Left;10 reps;Supine Straight Leg Raises: AAROM;Strengthening;Left;10 reps;Supine End of Session PT - End of Session Equipment Utilized During Treatment: Gait belt Activity Tolerance: Patient tolerated treatment well;Patient limited by pain Patient left: in bed;in CPM;with call bell in reach (CPM 0-70) Nurse Communication: Mobility status for transfers;Mobility status for ambulation General Behavior During Session: [facility] for tasks performed Cognition: [facility] for tasks performed  Ambrose Finland 11/29/2011, 2:33 PM  11/29/2011 Ambrose Finland DPT PAGER: [phone] OFFICE: [phone]

## 2011-11-29 NOTE — Progress Notes (Signed)
Physical Therapy Treatment Patient Details Name: Johnny Oneill MRN: [id number] DOB: December 29, 1940 Today's Date: 11/29/2011  PT Assessment/Plan  PT - Assessment/Plan Comments on Treatment Session: Pt progressing with ambulation distance, no dfificullties with lightheadedness throughout treatment. Continue per plan, possible attempt of stairs tomorrow prior to d/c Tuesday PT Plan: Discharge plan remains appropriate;Frequency remains appropriate PT Frequency: 7X/week Follow Up Recommendations: Home health PT Equipment Recommended: 3 in 1 bedside comode;Tub/shower bench;Rolling walker with 5" wheels PT Goals  Acute Rehab PT Goals PT Goal Formulation: With patient PT Goal: Supine/Side to Sit - Progress: Progressing toward goal PT Goal: Sit to Supine/Side - Progress: Progressing toward goal PT Goal: Sit to Stand - Progress: Progressing toward goal PT Goal: Stand to Sit - Progress: Progressing toward goal PT Goal: Ambulate - Progress: Progressing toward goal PT Goal: Perform Home Exercise Program - Progress: Progressing toward goal  PT Treatment Precautions/Restrictions  Precautions Precautions: Knee Restrictions Weight Bearing Restrictions: Yes LLE Weight Bearing: Weight bearing as tolerated Mobility (including Balance) Bed Mobility Bed Mobility: Yes Supine to Sit: 5: Supervision;HOB elevated (Comment degrees) Supine to Sit Details (indicate cue type and reason): VC for sequencing to assist LLE with RLE. Pt still with increased rocking to complete transfer although no physical assist needed Sitting - Scoot to Edge of Bed: 6: Modified independent (Device/Increase time) Sit to Supine: 4: Min assist Sit to Supine - Details (indicate cue type and reason): Min assist with LLE. VC for sequencing  Transfers Transfers: Yes Sit to Stand: 5: Supervision;With upper extremity assist;From bed Sit to Stand Details (indicate cue type and reason): VC for hand placement. No physical assist  needed Stand to Sit: 5: Supervision;With upper extremity assist;To chair/3-in-1 Stand to Sit Details: VC for hand placement and LLE placement. Pt controlled descent into chair  Ambulation/Gait Ambulation/Gait: Yes Ambulation/Gait Assistance: 5: Supervision Ambulation/Gait Assistance Details (indicate cue type and reason): VC for increased knee flexion during swing and knee extension during stance.  Ambulation Distance (Feet): 50 Feet Assistive device: Rolling walker Gait Pattern: Step-to pattern;Decreased stance time - left;Decreased hip/knee flexion - left;Decreased step length - right Gait velocity: decreased gait speed Stairs: No  Balance Balance Assessed: Yes Static Standing Balance Static Standing - Balance Support: Bilateral upper extremity supported Static Standing - Level of Assistance: 5: Stand by assistance Static Standing - Comment/# of Minutes: SBA for supervision during bathroom and hygiene Exercise  Total Joint Exercises Quad Sets: AROM;Left;Supine;10 reps Hip ABduction/ADduction: AAROM;Strengthening;Left;10 reps;Supine Straight Leg Raises: AAROM;Strengthening;Left;10 reps;Supine Long Arc Quad: AAROM;Strengthening;Left;10 reps;Seated Knee Flexion: AAROM;Strengthening;Left;10 reps;Seated End of Session PT - End of Session Equipment Utilized During Treatment: Gait belt Activity Tolerance: Patient tolerated treatment well Patient left: in chair;with call bell in reach Nurse Communication: Mobility status for ambulation;Mobility status for transfers General Behavior During Session: [facility] for tasks performed Cognition: [facility] for tasks performed  Ambrose Finland 11/29/2011, 5:18 PM  11/29/2011 Ambrose Finland DPT PAGER: [phone] OFFICE: [phone]

## 2011-11-29 NOTE — Progress Notes (Signed)
Patient ID: Johnny Oneill, male   DOB: Jun 03, 1941, 71 y.o.   MRN: [id number]   Patient Name: Johnny Oneill Date of Encounter: 11/29/2011    SUBJECTIVE  No chest pain or shortness of breath. No longer dizzy when stands up. Blood pressure improved. His be transferred to [place] [facility]. Hemoglobin is down to 9.3.  CURRENT MEDS    . acetaminophen  1,000 mg Intravenous Q6H  . aspirin  325 mg Oral Daily  . carvedilol  12.5 mg Oral BID WC  .  ceFAZolin (ANCEF) IV  1 g Intravenous Q6H  . docusate sodium  100 mg Oral BID  . enoxaparin  30 mg Subcutaneous Q12H  . ezetimibe  10 mg Oral Daily  . levothyroxine  150 mcg Oral Daily  . rosuvastatin  20 mg Oral q1800    OBJECTIVE  Filed Vitals:   11/28/11 1200 11/28/11 1600 11/28/11 2100 11/29/11 0500  BP: 113/65 106/63 113/65 101/68  Pulse: 103 107 119 113  Temp: 97.3 F (36.3 C) 97.4 F (36.3 C) 99.6 F (37.6 C) 99.3 F (37.4 C)  TempSrc: Oral Oral Oral Oral  Resp: 22 20 18 18   Height:      Weight:      SpO2: 97% 92% 95% 93%    Intake/Output Summary (Last 24 hours) at 11/29/11 1035 Last data filed at 11/29/11 0900  Gross per 24 hour  Intake    360 ml  Output    550 ml  Net   -190 ml   Filed Weights   11/27/11 1718 11/28/11 0400  Weight: 169 lb 1.5 oz (76.7 kg) 180 lb (81.647 kg)    PHYSICAL EXAM  General: Pleasant, NAD. Neuro: Alert and oriented X 3. Moves all extremities spontaneously. Psych: Normal affect. HEENT:  Normal  Neck: Supple without bruits or JVD. Lungs:  Resp regular and unlabored, CTA. Heart: RRR no s3, s4, or murmurs. Abdomen: Soft, non-tender, non-distended, BS + x 4.  Extremities: No clubbing, cyanosis or edema. DP/PT/Radials 2+ and equal bilaterally.  Accessory Clinical Findings  CBC  Basename 11/29/11 0547 11/28/11 0500  WBC 9.8 9.7  NEUTROABS -- --  HGB 9.3* 12.3*  HCT 27.8* 36.6*  MCV 89.4 89.3  PLT 122* [id number]*   Basic Metabolic Panel  Basename [id number] 0500  NA 138  K 4.0    CL 102  CO2 26  GLUCOSE 165*  BUN 21  CREATININE 1.13  CALCIUM 8.5  MG --  PHOS --   Liver Function Tests No results found for this basename: AST:2,ALT:2,ALKPHOS:2,BILITOT:2,PROT:2,ALBUMIN:2 in the last 72 hours No results found for this basename: LIPASE:2,AMYLASE:2 in the last 72 hours Cardiac Enzymes No results found for this basename: CKTOTAL:3,CKMB:3,CKMBINDEX:3,TROPONINI:3 in the last 72 hours BNP No components found with this basename: POCBNP:3 D-Dimer No results found for this basename: DDIMER:2 in the last 72 hours Hemoglobin A1C No results found for this basename: HGBA1C in the last 72 hours Fasting Lipid Panel No results found for this basename: CHOL,HDL,LDLCALC,TRIG,CHOLHDL,LDLDIRECT in the last 72 hours Thyroid Function Tests No results found for this basename: TSH,T4TOTAL,FREET3,T3FREE,THYROIDAB in the last 72 hours  TELE  Normal sinus rhythm, V. paced  ECG    Radiology/Studies  Dg Chest 2 View  11/19/2011  *RADIOLOGY REPORT*  Clinical Data: Preop left total knee replacement  CHEST - 2 VIEW  Comparison: 07/04/2008  Findings: Lungs are essentially clear. No pleural effusion or pneumothorax.  Cardiomediastinal silhouette is within normal limits.  Stable left subclavian ICD.  While the left  ventricular lead appears mildly looped on the frontal view, this is projectional, as it is normal on the lateral view.  Mild degenerative changes of the visualized thoracolumbar spine.  IMPRESSION: No evidence of acute cardiopulmonary disease.  Original Report Authenticated By: Julian Hy, M.D.   X-ray Knee Left Port  11/27/2011  *RADIOLOGY REPORT*  Clinical Data: Status post arthroplasty.  PORTABLE LEFT KNEE - 1-2 VIEW  Comparison: No priors.  Findings: AP lateral views of the left knee demonstrate postoperative changes of total knee arthroplasty.  The femoral and tibial components of the prosthesis appear well seated.  A small amount of gas is noted within the joint space  and the overlying soft tissues.  A line of surgical staples are seen anterior to the knee joint, and a surgical drain is in place within the joint.  No acute complicating features are identified.  Alignment appears anatomic.  IMPRESSION: 1.  Postoperative changes of left total knee arthroplasty, as above, without acute complicating features.  Original Report Authenticated By: Etheleen Mayhew, M.D.    ASSESSMENT AND PLAN  Principal Problem:  *Osteoarthritis Active Problems:  Ischemic cardiomyopathy  Hypercholesteremia  ICD (implantable cardiac defibrillator) in place  CAD (coronary artery disease)  Essential hypertension, benign  Chronic systolic heart failure    He is stable and ready for rehabilitation. We'll need to watch hemoglobin. We'll discontinue his IV fluids to increase ambulation. No other recommendations at this time.  Signed, Jenell Milliner MD

## 2011-11-30 DIAGNOSIS — I2510 Atherosclerotic heart disease of native coronary artery without angina pectoris: Secondary | ICD-10-CM

## 2011-11-30 DIAGNOSIS — I5022 Chronic systolic (congestive) heart failure: Secondary | ICD-10-CM

## 2011-11-30 LAB — CBC
HCT: 23.8 % — ABNORMAL LOW (ref 39.0–52.0)
Hemoglobin: 8 g/dL — ABNORMAL LOW (ref 13.0–17.0)
MCH: 29.7 pg (ref 26.0–34.0)
MCHC: 33.6 g/dL (ref 30.0–36.0)
MCV: 88.5 fL (ref 78.0–100.0)
Platelets: 125 10*3/uL — ABNORMAL LOW (ref 150–400)
RBC: 2.69 MIL/uL — ABNORMAL LOW (ref 4.22–5.81)
RDW: 14.2 % (ref 11.5–15.5)
WBC: 7.9 10*3/uL (ref 4.0–10.5)

## 2011-11-30 LAB — PREPARE RBC (CROSSMATCH)

## 2011-11-30 MED ORDER — POLYETHYLENE GLYCOL 3350 17 G PO PACK
17.0000 g | PACK | Freq: Every day | ORAL | Status: DC | PRN
  Administered 2011-12-01: 17 g via ORAL
  Filled 2011-11-30: qty 1

## 2011-11-30 MED ORDER — MAGNESIUM HYDROXIDE 400 MG/5ML PO SUSP: 30.0000 mL | Freq: Every day | ORAL | Status: DC | PRN

## 2011-11-30 MED ORDER — FLEET ENEMA 7-19 GM/118ML RE ENEM: 1.0000 | ENEMA | Freq: Every day | RECTAL | Status: DC | PRN

## 2011-11-30 NOTE — Progress Notes (Signed)
Comments:  11/30/11 [place], RN BSN Case Manager Contacted  TNT regarding 3in1 and tub bench being delvered to patient's home.

## 2011-11-30 NOTE — Progress Notes (Signed)
Physical Therapy Treatment Patient Details Name: Johnny Oneill MRN: [id number] DOB: [birth year] Today's Date: 11/30/2011  PT Assessment/Plan  PT - Assessment/Plan Comments on Treatment Session: Pt. with decreased HGB overt the last 3 days, dizziness after amb. 10 feet. monitored BP this session: 129/64 sitting after amb 10 ft. 109/63 standing. 124/66 sitting. Notified nurse of BP changes. pt. reported no pain this morning but states left leg feels very heavy.  PT Plan: Discharge plan remains appropriate;Frequency remains appropriate PT Frequency: 7X/week Follow Up Recommendations: Home health PT Equipment Recommended: 3 in 1 bedside comode;Tub/shower bench;Rolling walker with 5" wheels PT Goals  Acute Rehab PT Goals PT Goal Formulation: With patient Time For Goal Achievement: 7 days PT Goal: Supine/Side to Sit - Progress: Progressing toward goal PT Goal: Sit to Stand - Progress: Progressing toward goal PT Goal: Stand to Sit - Progress: Not met PT Goal: Ambulate - Progress: Not met  PT Treatment Precautions/Restrictions  Precautions Precautions: Knee Restrictions Weight Bearing Restrictions: Yes LLE Weight Bearing: Weight bearing as tolerated Mobility (including Balance) Bed Mobility Bed Mobility: Yes Supine to Sit: HOB flat;5: Supervision Supine to Sit Details (indicate cue type and reason): supervision for safety. Pt. used R LE to assist L LE over to EOB, increased time to achieve full sit.  Sitting - Scoot to Edge of Bed: 6: Modified independent (Device/Increase time) Transfers Transfers: Yes Sit to Stand: Other (comment);With upper extremity assist;From bed (min Guard (A)) Sit to Stand Details (indicate cue type and reason): min guard for safety due to low HGB today. cues for hand placement. Pt. feeling light headed with standing, did not subside. Stand to Sit: Other (comment);With armrests;With upper extremity assist (min guard(A)) Stand to Sit Details: min guard due to  patient feeling lightheaded. Pt. reported some relief with sitting. cues for hand placement and management of L LE. Ambulation/Gait Ambulation/Gait: Yes Ambulation/Gait Assistance: Other (comment) (min guard (A)) Ambulation/Gait Assistance Details (indicate cue type and reason): min guard due to pt. with lightheadedness and low HGB. Pt. reported increased dizziness after amb. ~ 10 feet.  Ambulation Distance (Feet): 10 Feet Assistive device: Rolling walker Gait Pattern: Step-through pattern;Decreased stance time - left;Decreased hip/knee flexion - left Gait velocity: decreased gait speed Stairs: No  Posture/Postural Control Posture/Postural Control: No significant limitations Exercise  Total Joint Exercises Ankle Circles/Pumps: AROM;Both;20 reps;Seated Quad Sets: AROM;Strengthening;Left;Other reps (comment);Seated (12 reps) Heel Slides: AAROM;Strengthening;Left;10 reps;Seated Hip ABduction/ADduction: AAROM;Left;Strengthening;10 reps;Supine Straight Leg Raises: AAROM;Strengthening;Left;10 reps;Seated End of Session PT - End of Session Equipment Utilized During Treatment: Gait belt Activity Tolerance: Treatment limited secondary to medical complications (Comment) Patient left: in chair;with call bell in reach Nurse Communication: Other (comment) (orthostatic hypotension and pt. with dizziness.) General Behavior During Session: [facility] for tasks performed Cognition: [facility] for tasks performed  Otto Herb SPTA 11/30/2011, 11:19 AM

## 2011-11-30 NOTE — Progress Notes (Signed)
OT Cancellation Note  Treatment cancelled today due to medical issues with patient which prohibited therapy. Hgb 8.0. Pt symptomatic feeling" light headed". Will attempt again later if able.  [place], OTR/L  [phone] 11/30/2011 11/30/2011, 11:41 AM

## 2011-11-30 NOTE — Progress Notes (Signed)
OT Cancellation Note  Treatment cancelled today due to pt requested to wait until tomorrow due to fatigue..  [place], OTR/L  [phone] 11/30/2011 11/29/2011, 9:24 AM

## 2011-11-30 NOTE — Progress Notes (Signed)
Lauris Keepers, PTA [phone] 11/30/2011  

## 2011-11-30 NOTE — Progress Notes (Signed)
Physical Therapy Treatment Patient Details Name: Johnny Oneill MRN: [id number] DOB: 07-25-1941 Today's Date: 11/30/2011  PT Assessment/Plan  PT - Assessment/Plan Comments on Treatment Session: Pt. with orthostatic hypotension this afternoon: 113/63 at rest; 75/64 standing after amb 4 feet; 97/59 sitting; 101/60 supine.  RN made aware. Pt. reported increased dizziness with ambulation, proceeded to bed, once supine pt. stating dizziness subsided. Will get tranfusion today per MD. Ther ex in supine and left pt. on CPM, increased flxn to 75 degrees.    PT Plan: Discharge plan remains appropriate;Frequency remains appropriate PT Frequency: 7X/week Follow Up Recommendations: Home health PT Equipment Recommended: 3 in 1 bedside comode;Tub/shower bench;Rolling walker with 5" wheels PT Goals  Acute Rehab PT Goals PT Goal Formulation: With patient Time For Goal Achievement: 7 days PT Goal: Supine/Side to Sit - Progress: Progressing toward goal PT Goal: Sit to Supine/Side - Progress: Progressing toward goal PT Goal: Sit to Stand - Progress: Progressing toward goal PT Goal: Stand to Sit - Progress: Progressing toward goal PT Goal: Ambulate - Progress: Not met  PT Treatment Precautions/Restrictions  Precautions Precautions: Knee Restrictions Weight Bearing Restrictions: Yes LLE Weight Bearing: Weight bearing as tolerated Mobility (including Balance) Bed Mobility Bed Mobility: Yes Sit to Supine: 4: Min assist;HOB flat Sit to Supine - Details (indicate cue type and reason): min (A) for management of L LE. cues for technique.  Transfers Transfers: Yes Sit to Stand: Other (comment);With upper extremity assist;With armrests;From chair/3-in-1 Sit to Stand Details (indicate cue type and reason): min guard due to low HGB. Demonstrated safe hand placement and management of L LE. Stand to Sit: Other (comment) (min guard(A)) Stand to Sit Details: min guard due to drop in BP. pt. with good control of  descent and correct hand placement. Verbal cues for management of L LE. Ambulation/Gait Ambulation/Gait: Yes Ambulation/Gait Assistance: Other (comment) (min guard (A)) Ambulation/Gait Assistance Details (indicate cue type and reason): min guard for safety. pt. amb ~8 feet in room, to sink and back due to drop in BP and pt. reporting increased dizziness.  Ambulation Distance (Feet): 8 Feet Assistive device: Rolling walker Gait Pattern: Step-to pattern;Decreased stance time - left Gait velocity: decreased gait speed Stairs: No  Posture/Postural Control Posture/Postural Control: No significant limitations Exercise  Total Joint Exercises Ankle Circles/Pumps: AROM;Both;20 reps;Supine Quad Sets: AROM;Strengthening;Left;10 reps;Supine Heel Slides: AAROM;Strengthening;Left;10 reps;Supine Hip ABduction/ADduction: AAROM;Strengthening;Left;10 reps;Supine End of Session PT - End of Session Equipment Utilized During Treatment: Gait belt Activity Tolerance: Treatment limited secondary to medical complications (Comment) Patient left: in CPM;in bed;with family/visitor present Nurse Communication: Other (comment) (BP changes, pt. request pain med.) General Behavior During Session: [facility] for tasks performed Cognition: [facility] for tasks performed  Otto Herb SPTA 11/30/2011, 2:20 PM     Sarajane Marek, PTA [phone] 11/30/2011

## 2011-11-30 NOTE — Progress Notes (Signed)
Patient Name: Johnny Oneill Date of Encounter: 11/30/2011  Principal Problem:  *Osteoarthritis Active Problems:  Ischemic cardiomyopathy  Hypercholesteremia  ICD (implantable cardiac defibrillator) in place  CAD (coronary artery disease)  Essential hypertension, benign  Chronic systolic heart failure    SUBJECTIVE: Weak today, orthostatic dizziness. No BM so had prune juice and is now nauseated. No CP and no new DOE. LLE feels heavy, swollen.   OBJECTIVE  Filed Vitals:   11/29/11 2018 11/29/11 2321 11/30/11 0543 11/30/11 0835  BP: 106/65  119/73 134/87  Pulse: 105  105 97  Temp: 100.3 F (37.9 C) 99.4 F (37.4 C) 99 F (37.2 C)   TempSrc: Oral Oral    Resp: 18  18   Height:      Weight:      SpO2: 94%  94%     Intake/Output Summary (Last 24 hours) at 11/30/11 1146 Last data filed at 11/30/11 0700  Gross per 24 hour  Intake    480 ml  Output    650 ml  Net   -170 ml   Weight change:  Wt Readings from Last 3 Encounters:  11/28/11 180 lb (81.647 kg)  11/28/11 180 lb (81.647 kg)  11/19/11 169 lb 1.5 oz (76.7 kg)     PHYSICAL EXAM General: Well developed, well nourished, male in mild distress. Head: Normocephalic, atraumatic.  Neck: Supple without bruits, JVD not elevated. Lungs:  Resp regular and unlabored, CTA bilaterally. Heart: RRR, S1, S2, no S3, S4, or murmurs. Abdomen: Soft, non-tender, non-distended, BS + x 4.  Extremities: No clubbing, cyanosis, edema LLE only, leg wrapped and not disturbed.  Neuro: Alert and oriented X 3. Moves all extremities spontaneously. Psych: Normal affect.  LABS: CBC: Basename 11/30/11 0530 11/29/11 0547  WBC 7.9 9.8  NEUTROABS -- --  HGB 8.0* 9.3*  HCT 23.8* 27.8*  MCV 88.5 89.4  PLT 125* [id number]*   Basic Metabolic Panel: Basename [id number] 0500  NA 138  K 4.0  CL 102  CO2 26  GLUCOSE 165*  BUN 21  CREATININE 1.13  CALCIUM 8.5  MG --  PHOS --   Anemia Panel:No results found for this basename:  VITAMINB12,FOLATE,FERRITIN,TIBC,IRON,RETICCTPCT in the last 72 hours  Radiology/Studies: X-ray Knee Left Port 11/27/2011  *RADIOLOGY REPORT*  Clinical Data: Status post arthroplasty.  PORTABLE LEFT KNEE - 1-2 VIEW  Comparison: No priors.  Findings: AP lateral views of the left knee demonstrate postoperative changes of total knee arthroplasty.  The femoral and tibial components of the prosthesis appear well seated.  A small amount of gas is noted within the joint space and the overlying soft tissues.  A line of surgical staples are seen anterior to the knee joint, and a surgical drain is in place within the joint.  No acute complicating features are identified.  Alignment appears anatomic.  IMPRESSION: 1.  Postoperative changes of left total knee arthroplasty, as above, without acute complicating features.  Original Report Authenticated By: Etheleen Mayhew, M.D.    Current Medications:    . aspirin  325 mg Oral Daily  . carvedilol  12.5 mg Oral BID WC  . docusate sodium  100 mg Oral BID  . enoxaparin  30 mg Subcutaneous Q12H  . ezetimibe  10 mg Oral Daily  . levothyroxine  150 mcg Oral Daily  . rosuvastatin  20 mg Oral q1800    ASSESSMENT AND PLAN: Principal Problem:  *Osteoarthritis - s/p TKA, per Dr French Ana  Active Problems:  ICD (implantable  cardiac defibrillator) in place - no palps, follow   CAD (coronary artery disease) - no ischemic sx, follow, on ASA/BB/statin   Chronic systolic heart failure - wt is up some but no systemic overload by exam. Cont to watch volume/wt closely  Orthostatic dizziness - his BP is low-nl but HR is elevated. Continue Coreg at current dose. Consider transfusion but will leave to Dr French Ana.     Signed, Rosaria Ferries , PA-C 11:46 AM 11/30/2011 Patient seen and examined and history reviewed. Agree with above findings and plan. Patient has significant orthostasis noted with symptoms. Hbg low at 8.0. Agree with transfusion of PRBCs for volume and  anemia. I would like to resume Plavix once Hgb improved. Continue ASA for now. No new anginal sx or CHF.   Luana Shu 11/30/2011 3:55 PM

## 2011-11-30 NOTE — Progress Notes (Signed)
UR COMPLETED  

## 2011-11-30 NOTE — Progress Notes (Signed)
Subjective: 3 Days Post-Op Procedure(s) (LRB): TOTAL KNEE ARTHROPLASTY (Left) Patient reports pain as mild.  Dizzy when getting up, low hgb 8.0 down from 9.3 yesterday.  Objective: Vital signs in last 24 hours: Temp:  [98.8 F (37.1 C)-100.3 F (37.9 C)] 99 F (37.2 C) (04/01 0543) Pulse Rate:  [97-105] 97  (04/01 0835) Resp:  [18-20] 18  (04/01 0543) BP: (106-134)/(59-87) 134/87 mmHg (04/01 0835) SpO2:  [94 %-95 %] 94 % (04/01 0543)  Intake/Output from previous day: 03/31 0701 - 04/01 0700 In: 600 [P.O.:600] Out: 800 [Urine:800] Intake/Output this shift:     Basename 11/30/11 0530 11/29/11 0547 11/28/11 0500  HGB 8.0* 9.3* 12.3*    Basename 11/30/11 0530 11/29/11 0547  WBC 7.9 9.8  RBC 2.69* 3.11*  HCT 23.8* 27.8*  PLT 125* 122*    Basename 11/28/11 0500  NA 138  K 4.0  CL 102  CO2 26  BUN 21  CREATININE 1.13  GLUCOSE 165*  CALCIUM 8.5   No results found for this basename: LABPT:2,INR:2 in the last 72 hours  Neurovascular intact Sensation intact distally Intact pulses distally Dorsiflexion/Plantar flexion intact Incision: moderate drainage and mainly noted from hemovac drain site No cellulitis present Compartment soft  Assessment/Plan: 3 Days Post-Op Procedure(s) (LRB): TOTAL KNEE ARTHROPLASTY (Left) Up with therapy Hold lovenox tonight and tomorrow morning will reassess when to restart Transfuse 2 units PRBC  Monitor drainage and change dsg prn over drain site Discussed milk of mag or similar treatment options to assist with BM Repeat labs in AM, CBC, BMP Estimated d/c home with [facility] Wed of this week  Georgeanna Radziewicz 11/30/2011, 1:45 PM

## 2011-12-01 ENCOUNTER — Encounter (HOSPITAL_COMMUNITY): Payer: Self-pay | Admitting: Orthopedic Surgery

## 2011-12-01 DIAGNOSIS — I2589 Other forms of chronic ischemic heart disease: Secondary | ICD-10-CM

## 2011-12-01 DIAGNOSIS — D62 Acute posthemorrhagic anemia: Secondary | ICD-10-CM

## 2011-12-01 DIAGNOSIS — I10 Essential (primary) hypertension: Secondary | ICD-10-CM

## 2011-12-01 LAB — BASIC METABOLIC PANEL
BUN: 16 mg/dL (ref 6–23)
CO2: 28 mEq/L (ref 19–32)
Calcium: 8.6 mg/dL (ref 8.4–10.5)
Chloride: 103 mEq/L (ref 96–112)
Creatinine, Ser: 1.06 mg/dL (ref 0.50–1.35)
GFR calc Af Amer: 81 mL/min — ABNORMAL LOW (ref >90)
GFR calc non Af Amer: 70 mL/min — ABNORMAL LOW (ref >90)
Glucose, Bld: 110 mg/dL — ABNORMAL HIGH (ref 70–99)
Potassium: 4 mEq/L (ref 3.5–5.1)
Sodium: 138 mEq/L (ref 135–145)

## 2011-12-01 LAB — TYPE AND SCREEN
ABO/RH(D): O NEG
Antibody Screen: NEGATIVE
Unit division: 0
Unit division: 0

## 2011-12-01 LAB — CBC
HCT: 28.9 % — ABNORMAL LOW (ref 39.0–52.0)
Hemoglobin: 9.7 g/dL — ABNORMAL LOW (ref 13.0–17.0)
MCH: 29.6 pg (ref 26.0–34.0)
MCHC: 33.6 g/dL (ref 30.0–36.0)
MCV: 88.1 fL (ref 78.0–100.0)
Platelets: 128 10*3/uL — ABNORMAL LOW (ref 150–400)
RBC: 3.28 MIL/uL — ABNORMAL LOW (ref 4.22–5.81)
RDW: 14 % (ref 11.5–15.5)
WBC: 7 10*3/uL (ref 4.0–10.5)

## 2011-12-01 MED ORDER — PANTOPRAZOLE SODIUM 40 MG PO TBEC
40.0000 mg | DELAYED_RELEASE_TABLET | Freq: Every day | ORAL | Status: DC
  Administered 2011-12-01 – 2011-12-02 (×2): 40 mg via ORAL
  Filled 2011-12-01: qty 1

## 2011-12-01 NOTE — Progress Notes (Signed)
Occupational Therapy Treatment Patient Details Name: Johnny Oneill MRN: [id number] DOB: 1940/11/05 Today's Date: 12/01/2011  OT Assessment/Plan OT Assessment/Plan Comments on Treatment Session: pt. completed tub transfer with use of bench and educated on techniques for completing ADLs  OT Plan: Discharge plan remains appropriate OT Frequency: Min 2X/week Follow Up Recommendations: No OT follow up Equipment Recommended: 3 in 1 bedside comode;Rolling walker with 5" wheels;Tub/shower bench OT Goals Acute Rehab OT Goals OT Goal Formulation: With patient/family Time For Goal Achievement: 7 days ADL Goals Pt Will Transfer to Toilet: with modified independence;Ambulation;3-in-1;with DME ADL Goal: Toilet Transfer - Progress: Progressing toward goals Pt Will Perform Tub/Shower Transfer: with supervision;Ambulation;with DME;Transfer tub bench ADL Goal: Tub/Shower Transfer - Progress: Progressing toward goals  OT Treatment Precautions/Restrictions  Precautions Precautions: Knee Restrictions Weight Bearing Restrictions: Yes LLE Weight Bearing: Weight bearing as tolerated   ADL ADL Tub/Shower Transfer: Simulated;Moderate assistance Tub/Shower Transfer Details (indicate cue type and reason): Assist for left LE over tub wall Tub/Shower Transfer Equipment: Transfer tub bench Equipment Used: Rolling walker Ambulation Related to ADLs: min assist ~8' ADL Comments: Pt. able to recall use of AE to complete LB ADLs. Educated pt. on use of sheet/towel to lift leg over wall of tub to facilitate increased independence. Mobility  Bed Mobility Bed Mobility: No Supine to Sit: HOB flat;4: Min assist Supine to Sit Details (indicate cue type and reason): minA for L LE assist, pt able to manage trunk Sitting - Scoot to Edge of Bed: 6: Modified independent (Device/Increase time) Transfers Sit to Stand: 4: Min assist;From bed Sit to Stand Details (indicate cue type and reason): v/c's for hand placement  and L LE management Stand to Sit: 4: Min assist Stand to Sit Details: v/c's for hand placement   End of Session OT - End of Session Equipment Utilized During Treatment: Gait belt Activity Tolerance: Patient tolerated treatment well Patient left: in chair Nurse Communication: Mobility status for transfers General Behavior During Session: [facility] for tasks performed Cognition: [facility] for tasks performed  Annaleigha Woo, OTR/L Pager [phone]  12/01/2011, 12:58 PM

## 2011-12-01 NOTE — Progress Notes (Signed)
Physical Therapy Treatment Note   12/01/11 1300  PT Visit Information  Last PT Received On 12/01/11  Precautions  Precautions Knee  Restrictions  LLE Weight Bearing WBAT  Bed Mobility  Bed Mobility No (pt received sitting up in chair)  Transfers  Sit to Stand 5: Supervision  Sit to Stand Details (indicate cue type and reason) increased time but good hand placement  Stand to Sit 5: Supervision  Stand to Sit Details increased time  Ambulation/Gait  Ambulation/Gait Assistance 4: Min assist (contact guard)  Ambulation/Gait Assistance Details (indicate cue type and reason) decreased L LE WBing  Ambulation Distance (Feet) 50 Feet  Assistive device Rolling walker  Gait Pattern Step-to pattern;Decreased step length - right;Decreased stance time - left  Gait velocity slow  Stairs Yes (spouse present and educated, handout provided)  Stairs Assistance 4: Min Therapist, sports Assistance Details (indicate cue type and reason) assist for walker management via spouse and v/cs for sequencing  Stair Management Technique No rails;Backwards;With walker  Number of Stairs 2   Wheelchair Mobility  Wheelchair Mobility No  PT - End of Session  Equipment Utilized During Treatment Gait belt  Activity Tolerance Patient tolerated treatment well  Patient left in chair;with call bell in reach;with family/visitor present  Nurse Communication Mobility status for transfers;Mobility status for ambulation  General  Behavior During Session [facility] for tasks performed  Cognition [facility] for tasks performed  PT - Assessment/Plan  Comments on Treatment Session Patient with increased fatigue however hasn't been able to participate in much therapy until today due to orthostatic hypotension. patient sposue demonstrated abilty to safely assist patient up/down stairs.  PT Plan Discharge plan remains appropriate;Frequency remains appropriate  PT Frequency 7X/week  Follow Up Recommendations Home health  PT;Supervision/Assistance - 24 hour  Equipment Recommended (has DME)  Acute Rehab PT Goals  PT Goal: Supine/Side to Sit - Progress Progressing toward goal  PT Goal: Sit to Supine/Side - Progress Progressing toward goal  PT Goal: Sit to Stand - Progress Progressing toward goal  PT Goal: Stand to Sit - Progress Progressing toward goal  PT Goal: Ambulate - Progress Progressing toward goal  PT Goal: Up/Down Stairs - Progress Progressing toward goal  PT Goal: Perform Home Exercise Program - Progress Progressing toward goal     Pain: pt unable to rate L knee pain but reports it to be more sore than this AM  [name], PT, DPT Pager #: [phone] Office #: [phone]

## 2011-12-01 NOTE — Progress Notes (Signed)
Physical Therapy Treatment Note   12/01/11 1040  PT Visit Information  Last PT Received On 12/01/11  Precautions  Precautions Knee  Restrictions  LLE Weight Bearing WBAT  Bed Mobility  Supine to Sit HOB flat;4: Min assist  Supine to Sit Details (indicate cue type and reason) minA for L LE assist, pt able to manage trunk  Sitting - Scoot to Edge of Bed 6: Modified independent (Device/Increase time)  Transfers  Sit to Stand 4: Min assist;From bed  Sit to Stand Details (indicate cue type and reason) v/c's for hand placement and L LE management  Stand to Sit 4: Min assist  Stand to Sit Details v/c's for hand placement  Ambulation/Gait  Ambulation/Gait Assistance 4: Min assist (contact guard)  Ambulation/Gait Assistance Details (indicate cue type and reason) pt with increased ability to WB through L LE, pt with no c/o of lightheadedness or dizziness  Ambulation Distance (Feet) 40 Feet (and 20 feet, with seated rest break btw)  Assistive device Rolling walker  Gait Pattern Step-to pattern;Decreased step length - left;Decreased stance time - left;Antalgic  Gait velocity decreased  Wheelchair Mobility  Wheelchair Mobility No  Posture/Postural Control  Posture/Postural Control No significant limitations  Total Joint Exercises  Quad Sets AROM;Strengthening;Left;Supine;20 reps  Short Arc Taylor;Left;10 reps;Seated (with LEs elevated)  Heel Slides AAROM;Left;10 reps;Seated (with LEs elevated)  PT - End of Session  Equipment Utilized During Treatment Gait belt  Activity Tolerance Patient tolerated treatment well  Patient left in chair;with call bell in reach  Nurse Communication Mobility status for transfers;Mobility status for ambulation  General  Behavior During Session [facility] for tasks performed  Cognition [facility] for tasks performed  PT - Assessment/Plan  Comments on Treatment Session Pt with minimal lightheadedness this date allowing patient to have increased ambulation tolerance  this date. Patient con't to have L LE swelling but minimal pain. patient achieved approx 30 degrees active L knee flexion in supine and approx 50 degress AA L knee flexion in supine. Plan is to practice stairs with wife this afternoon.  PT Plan Discharge plan remains appropriate;Frequency remains appropriate  PT Frequency 7X/week  Follow Up Recommendations Home health PT;Supervision/Assistance - 24 hour  Equipment Recommended 3 in 1 bedside comode;Rolling walker with 5" wheels;Tub/shower bench  Acute Rehab PT Goals  PT Goal: Supine/Side to Sit - Progress Progressing toward goal  PT Goal: Sit to Supine/Side - Progress Progressing toward goal  PT Goal: Sit to Stand - Progress Progressing toward goal  PT Goal: Stand to Sit - Progress Progressing toward goal  PT Goal: Ambulate - Progress Progressing toward goal  PT Goal: Perform Home Exercise Program - Progress Progressing toward goal    Pain: Patient did not rate pain but reports "it's really not that bad" in reference to L knee but did request pain medication s/p PT treatment.  [name], PT, DPT Pager #: [phone] Office #: [phone]

## 2011-12-01 NOTE — Discharge Instructions (Signed)
Diet: As you were doing prior to hospitalization   Activity:  Increase activity slowly as tolerated                  No lifting or driving for 6 weeks  Shower:  May shower without a dressing starting Wed, NO SOAKING in tubs then cover with clean, dry dsg  Dressing:  You may change your dressing daily                    And TED hose for knees.    Weight Bearing:   weight bearing as taught in physical therapy.  Use a walker or                    Crutches as instructed.  To prevent constipation: you may use a stool softener such as -               Colace ( over the counter) 100 mg by mouth twice a day                Drink plenty of fluids ( prune juice may be helpful) and high fiber foods                Miralax ( over the counter) for constipation as needed.    Precautions:  If you experience chest pain or shortness of breath - call 911 immediately               For transfer to the hospital emergency department!!               If you develop a fever greater that 101 F, purulent drainage from wound,                             increased redness or drainage from wound, or calf pain -- Call the office at                                                 [phone].  Follow- Up Appointment:  Please call for an appointment to be seen 2 wks from date of surgery or as previously scheduled               [facility] - [phone]               [facility] - [phone]               [place] - [phone]

## 2011-12-01 NOTE — Progress Notes (Signed)
Patient Name: Johnny Oneill Date of Encounter: 12/01/2011  Principal Problem:  *Osteoarthritis Active Problems:  CAD (coronary artery disease)  Chronic systolic heart failure  Hypercholesteremia  Essential hypertension, benign  Ischemic cardiomyopathy  ICD (implantable cardiac defibrillator) in place  Acute blood loss anemia    SUBJECTIVE: Feeling much better today. Energy improved. No orthostatic symptoms. Up ambulating with PT.   OBJECTIVE  Filed Vitals:   11/30/11 2100 11/30/11 2115 11/30/11 2200 12/01/11 0602  BP: 119/72 120/71 124/71 126/73  Pulse: 104 103 104 100  Temp: 99.3 F (37.4 C) 99.2 F (37.3 C) 99 F (37.2 C) 98.8 F (37.1 C)  TempSrc:  Oral Oral   Resp: 18 20 18 18   Height:      Weight:      SpO2: 95%   100%    Intake/Output Summary (Last 24 hours) at 12/01/11 1337 Last data filed at 12/01/11 0659  Gross per 24 hour  Intake    860 ml  Output    700 ml  Net    160 ml   Weight change:  Wt Readings from Last 3 Encounters:  11/28/11 180 lb (81.647 kg)  11/28/11 180 lb (81.647 kg)  11/19/11 169 lb 1.5 oz (76.7 kg)     PHYSICAL EXAM General: Well developed, well nourished, male in mild distress. Head: Normocephalic, atraumatic.  Neck: Supple without bruits, JVD not elevated. Lungs:  Resp regular and unlabored, CTA bilaterally. Heart: RRR, S1, S2, no S3, S4, or murmurs. Abdomen: Soft, non-tender, non-distended, BS + x 4.  Extremities: No clubbing, cyanosis, edema LLE only. Neuro: Alert and oriented X 3. Moves all extremities spontaneously. Psych: Normal affect.  LABS: CBC:  Basename 12/01/11 0515 11/30/11 0530  WBC 7.0 7.9  NEUTROABS -- --  HGB 9.7* 8.0*  HCT 28.9* 23.8*  MCV 88.1 88.5  PLT 128* 0000000*   Basic Metabolic Panel:  Basename 12/01/11 0515  NA 138  K 4.0  CL 103  CO2 28  GLUCOSE 110*  BUN 16  CREATININE 1.06  CALCIUM 8.6  MG --  PHOS --   Anemia Panel:No results found for this basename:  VITAMINB12,FOLATE,FERRITIN,TIBC,IRON,RETICCTPCT in the last 72 hours  Radiology/Studies: X-ray Knee Left Port 11/27/2011  *RADIOLOGY REPORT*  Clinical Data: Status post arthroplasty.  PORTABLE LEFT KNEE - 1-2 VIEW  Comparison: No priors.  Findings: AP lateral views of the left knee demonstrate postoperative changes of total knee arthroplasty.  The femoral and tibial components of the prosthesis appear well seated.  A small amount of gas is noted within the joint space and the overlying soft tissues.  A line of surgical staples are seen anterior to the knee joint, and a surgical drain is in place within the joint.  No acute complicating features are identified.  Alignment appears anatomic.  IMPRESSION: 1.  Postoperative changes of left total knee arthroplasty, as above, without acute complicating features.  Original Report Authenticated By: Etheleen Mayhew, M.D.    Current Medications:     . aspirin  325 mg Oral Daily  . carvedilol  12.5 mg Oral BID WC  . docusate sodium  100 mg Oral BID  . ezetimibe  10 mg Oral Daily  . levothyroxine  150 mcg Oral Daily  . rosuvastatin  20 mg Oral q1800    ASSESSMENT AND PLAN: Principal Problem:  *Osteoarthritis - s/p TKA, per Dr French Ana  Active Problems:  ICD (implantable cardiac defibrillator) in place - no palps, follow   CAD (coronary artery  disease) - no ischemic sx, follow, on ASA/BB/statin   Chronic systolic heart failure - compensated  Orthostatic dizziness - resolved with transfusion.   Plan: continue Rx per ortho. Would advise resuming Plavix on discharge.  Signed, Dannelle Rhymes [place] , [place] 1:37 PM 12/01/2011

## 2011-12-01 NOTE — Progress Notes (Signed)
Subjective: 4 Days Post-Op Procedure(s) (LRB): TOTAL KNEE ARTHROPLASTY (Left) Patient reports pain as mild.    Objective: Vital signs in last 24 hours: Temp:  [98.1 F (36.7 C)-99.3 F (37.4 C)] 98.8 F (37.1 C) (04/02 0602) Pulse Rate:  [99-105] 100  (04/02 0602) Resp:  [16-20] 18  (04/02 0602) BP: (101-126)/(60-73) 126/73 mmHg (04/02 0602) SpO2:  [95 %-100 %] 100 % (04/02 0602)  Intake/Output from previous day: 04/01 0701 - 04/02 0700 In: 860 [P.O.:360; I.V.:500] Out: 700 [Urine:700] Intake/Output this shift:     Basename 12/01/11 0515 11/30/11 0530 11/29/11 0547  HGB 9.7* 8.0* 9.3*    Basename 12/01/11 0515 11/30/11 0530  WBC 7.0 7.9  RBC 3.28* 2.69*  HCT 28.9* 23.8*  PLT 128* 125*    Basename 12/01/11 0515  NA 138  K 4.0  CL 103  CO2 28  BUN 16  CREATININE 1.06  GLUCOSE 110*  CALCIUM 8.6   No results found for this basename: LABPT:2,INR:2 in the last 72 hours  Neurovascular intact Sensation intact distally Intact pulses distally Dorsiflexion/Plantar flexion intact Incision: dressing C/D/I and scant drainage No cellulitis present  Assessment/Plan: 4 Days Post-Op Procedure(s) (LRB): TOTAL KNEE ARTHROPLASTY (Left) Up with therapy Continue to hold lovenox today Daily dsg change and prn Recheck lab in AM Venous duplex study LLE in AM r/o DVT Likely d/c home with [facility] tomorrow if stable   Chriss Czar 12/01/2011, 12:32 PM

## 2011-12-02 DIAGNOSIS — M7989 Other specified soft tissue disorders: Secondary | ICD-10-CM

## 2011-12-02 LAB — BASIC METABOLIC PANEL
BUN: 14 mg/dL (ref 6–23)
CO2: 27 mEq/L (ref 19–32)
Calcium: 8.5 mg/dL (ref 8.4–10.5)
Chloride: 102 mEq/L (ref 96–112)
Creatinine, Ser: 0.96 mg/dL (ref 0.50–1.35)
GFR calc Af Amer: >90 mL/min (ref >90)
GFR calc non Af Amer: 83 mL/min — ABNORMAL LOW (ref >90)
Glucose, Bld: 104 mg/dL — ABNORMAL HIGH (ref 70–99)
Potassium: 3.8 mEq/L (ref 3.5–5.1)
Sodium: 137 mEq/L (ref 135–145)

## 2011-12-02 LAB — CBC
HCT: 28.4 % — ABNORMAL LOW (ref 39.0–52.0)
Hemoglobin: 9.5 g/dL — ABNORMAL LOW (ref 13.0–17.0)
MCH: 29.4 pg (ref 26.0–34.0)
MCHC: 33.5 g/dL (ref 30.0–36.0)
MCV: 87.9 fL (ref 78.0–100.0)
Platelets: 157 10*3/uL (ref 150–400)
RBC: 3.23 MIL/uL — ABNORMAL LOW (ref 4.22–5.81)
RDW: 13.9 % (ref 11.5–15.5)
WBC: 5.4 10*3/uL (ref 4.0–10.5)

## 2011-12-02 MED ORDER — CLOPIDOGREL BISULFATE 75 MG PO TABS
75.0000 mg | ORAL_TABLET | Freq: Every day | ORAL | Status: DC
  Filled 2011-12-02: qty 1

## 2011-12-02 MED ORDER — METHOCARBAMOL 500 MG PO TABS: 500.0000 mg | ORAL_TABLET | Freq: Four times a day (QID) | ORAL | Status: DC | PRN

## 2011-12-02 MED ORDER — ENOXAPARIN SODIUM 40 MG/0.4ML ~~LOC~~ SOLN: 40.0000 mg | SUBCUTANEOUS | Status: DC

## 2011-12-02 MED ORDER — OXYCODONE HCL 5 MG PO TABS: 5.0000 mg | ORAL_TABLET | ORAL | Status: DC | PRN

## 2011-12-02 MED ORDER — ENOXAPARIN SODIUM 40 MG/0.4ML ~~LOC~~ SOLN
40.0000 mg | SUBCUTANEOUS | Status: DC
  Administered 2011-12-02: 40 mg via SUBCUTANEOUS
  Filled 2011-12-02: qty 0.4

## 2011-12-02 NOTE — Discharge Summary (Signed)
PATIENT ID:      HSER EAGAR  MRN:     [id number] DOB/AGE:    1940/11/07 / 71 y.o.     DISCHARGE SUMMARY  ADMISSION DATE:    11/27/2011 DISCHARGE DATE:   12/02/2011   ADMISSION DIAGNOSIS: osteoarthritis left knee  (osteoarthritis left knee)  DISCHARGE DIAGNOSIS:  osteoarthritis left knee    ADDITIONAL DIAGNOSIS: Principal Problem:  *Osteoarthritis Active Problems:  Ischemic cardiomyopathy  Hypercholesteremia  ICD (implantable cardiac defibrillator) in place  CAD (coronary artery disease)  Essential hypertension, benign  Chronic systolic heart failure  Acute blood loss anemia  Past Medical History  Diagnosis Date  . Ischemic cardiomyopathy     a. 10/09 Echo: Sev LV Dysfxn, inf/lat AK, Mod MR.  [name] Hypercholesteremia   . Osteoarthritis     a. s/p   . Anxiety   . Hypothyroidism   . CAD (coronary artery disease)     a. s/p Lat MI 2009  . ICD (implantable cardiac defibrillator) in place     PROPHYLACTIC      medtronic  . RBBB   . 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 Hypertension   . PVC (premature ventricular contraction)   . Pacemaker     PROCEDURE: Procedure(s): TOTAL KNEE ARTHROPLASTY on 11/27/2011  CONSULTS: Treatment Team:  Renella Cunas, MD   HISTORY:  See H&P in chart  HOSPITAL COURSE:  Johnny Oneill is a 71 y.o. admitted on 11/27/2011 and found to have a diagnosis of osteoarthritis left knee.  After appropriate laboratory studies were obtained  they were taken to the operating room on 11/27/2011 and underwent Procedure(s): TOTAL KNEE ARTHROPLASTY.   They were given perioperative antibiotics:  Anti-infectives     Start     Dose/Rate Route Frequency Ordered Stop   11/27/11 2330   ceFAZolin (ANCEF) IVPB 1 g/50 mL premix        1 g 100 mL/hr over 30 Minutes Intravenous Every 6 hours 11/27/11 1523 11/28/11 1241   11/26/11 1335   ceFAZolin (ANCEF) IVPB 2 g/50 mL premix        2 g 100 mL/hr over 30 Minutes Intravenous 60 min pre-op 11/26/11  1335 11/27/11 1124        .  Tolerated the procedure well.  Placed with a foley intraoperatively.  Given Ofirmev at induction and for 24 hours. Pt was transferred to telemetry unit due to heart history.  Transferred to ortho nursing unit on POD#3.  POD #1, allowed out of bed to a chair.  PT for ambulation and exercise program.  Foley D/C'd in morning.  IV saline locked.  O2 discontionued.  POD #2, continued PT and ambulation.   POD#3, HGB had trended down, and had increased swelling and drainage in LLE.  Lovenox was held, transfused 2 units PRBCs.  POD#4, HGB stable, continued to hold lovenox, was still receiving home aspirin dose, holding plavix.    POD#5, obtained veous duplex LLE no significant findings, restart lovenox 40mg  daily.  Start at home.  The remainder of the hospital course was dedicated to ambulation and strengthening.   The patient was discharged on 5 Days Post-Op in  Stable condition.  Blood products given:2 units CC PRBC  DIAGNOSTIC STUDIES: Recent vital signs: Patient Vitals for the past 24 hrs:  BP Temp Pulse Resp SpO2  12/02/11 0507 112/63 mmHg 98.5 F (36.9 C) 92  18  98 %  12/01/11 2117 136/72 mmHg 98.5 F (  36.9 C) 95  18  100 %  12/19/2011 1500 122/67 mmHg 98.5 F (36.9 C) 80  18  97 %       Recent laboratory studies:  Basename 12/02/11 0540 Dec 19, 2011 0515 11/30/11 0530 11/29/11 0547 11/28/11 0500  WBC 5.4 7.0 7.9 9.8 9.7  HGB 9.5* 9.7* 8.0* 9.3* 12.3*  HCT 28.4* 28.9* 23.8* 27.8* 36.6*  PLT 157 128* 125* 122* 140*    Basename 12/02/11 0540 2011-12-19 0515 11/28/11 0500  NA 137 138 138  K 3.8 4.0 4.0  CL 102 103 102  CO2 27 28 26   BUN 14 16 21   CREATININE 0.96 1.06 1.13  GLUCOSE 104* 110* 165*  CALCIUM 8.5 8.6 8.5   Lab Results  Component Value Date   INR 1.07 11/19/2011   INR 1.0 10/10/2007     Recent Radiographic Studies :  Dg Chest 2 View  11/19/2011  *RADIOLOGY REPORT*  Clinical Data: Preop left total knee replacement  CHEST - 2 VIEW   Comparison: 07/04/2008  Findings: Lungs are essentially clear. No pleural effusion or pneumothorax.  Cardiomediastinal silhouette is within normal limits.  Stable left subclavian ICD.  While the left ventricular lead appears mildly looped on the frontal view, this is projectional, as it is normal on the lateral view.  Mild degenerative changes of the visualized thoracolumbar spine.  IMPRESSION: No evidence of acute cardiopulmonary disease.  Original Report Authenticated By: Julian Hy, M.D.   X-ray Knee Left Port  11/27/2011  *RADIOLOGY REPORT*  Clinical Data: Status post arthroplasty.  PORTABLE LEFT KNEE - 1-2 VIEW  Comparison: No priors.  Findings: AP lateral views of the left knee demonstrate postoperative changes of total knee arthroplasty.  The femoral and tibial components of the prosthesis appear well seated.  A small amount of gas is noted within the joint space and the overlying soft tissues.  A line of surgical staples are seen anterior to the knee joint, and a surgical drain is in place within the joint.  No acute complicating features are identified.  Alignment appears anatomic.  IMPRESSION: 1.  Postoperative changes of left total knee arthroplasty, as above, without acute complicating features.  Original Report Authenticated By: Etheleen Mayhew, M.D.    DISCHARGE INSTRUCTIONS: Discharge Orders    Future Appointments: Provider: Department: Dept Phone: Center:   01/28/2012 10:55 AM [facility] [phone] [facility]      DISCHARGE MEDICATIONS:   Medication List  As of 12/02/2011 12:15 PM   ASK your doctor about these medications         aspirin 325 MG tablet   Take 325 mg by mouth daily.      carvedilol 12.5 MG tablet   Commonly known as: COREG   Take 1 tablet (12.5 mg total) by mouth 2 (two) times daily with a meal.      clopidogrel 75 MG tablet   Commonly known as: PLAVIX   Take 1 tablet (75 mg total) by mouth daily.      CO Q-10 PO     Take 1 capsule by mouth daily.      ezetimibe 10 MG tablet   Commonly known as: ZETIA   Take 1 tablet (10 mg total) by mouth daily.      levothyroxine 150 MCG tablet   Commonly known as: SYNTHROID, LEVOTHROID   Take 150 mcg by mouth daily.      nitroGLYCERIN 0.4 MG SL tablet   Commonly known as: NITROSTAT   Place  0.4 mg under the tongue every 5 (five) minutes as needed. For chest pain      rosuvastatin 20 MG tablet   Commonly known as: CRESTOR   Take 1 tablet (20 mg total) by mouth daily.            FOLLOW UP VISIT:   Follow-up Information    Follow up with [facility] (Home Health Physical Therapy. )    Contact information:   [phone]      Schedule an appointment as soon as possible for a visit with CAFFREY JR,W D, MD. (2 weeks from date of surgery)    Contact information:   Jewel Baize, [place] [phone]          DISPOSITION:  Home    CONDITION:  Stable   Chriss Czar 12/02/2011, 12:15 PM

## 2011-12-02 NOTE — Progress Notes (Signed)
Physical Therapy Treatment Note   12/02/11 1052  PT Visit Information  Last PT Received On 12/02/11  Precautions  Precautions Knee  Restrictions  LLE Weight Bearing WBAT  Bed Mobility  Bed Mobility No (pt received sitting up in chair)  Transfers  Sit to Stand 5: Supervision  Stand to Sit 5: Supervision  Ambulation/Gait  Ambulation/Gait Assistance 4: Min assist (close guard)  Ambulation/Gait Assistance Details (indicate cue type and reason) increased ability to WB through L LE  Ambulation Distance (Feet) 50 Feet (x2, to/from gym)  Assistive device Rolling walker  Gait Pattern Step-to pattern;Decreased step length - left;Decreased stance time - left;Antalgic  Gait velocity slow  Stairs No  Exercises  Exercises Total Joint  Total Joint Exercises  Quad Sets AROM;Left;10 reps;Supine  Short Arc Quad AAROM;Left;Supine (also completed 10 AROM with L )  Heel Slides AROM;Left;10 reps;Seated (AAROM to 90 degrees in sitting)  Ankle Circles/Pumps AROM;Both;10 reps;Supine  Long Arc Quad AROM;Left;10 reps;Seated (achieved approx 1/2 of full ROM)  Knee Flexion AAROM;Left;10 reps;Supine  PT - End of Session  Equipment Utilized During Treatment Gait belt  Activity Tolerance Patient tolerated treatment well  Patient left in chair;with call bell in reach  Nurse Communication Mobility status for transfers;Mobility status for ambulation  General  Behavior During Session [facility] for tasks performed  Cognition [facility] for tasks performed  PT - Assessment/Plan  Comments on Treatment Session Patient with improved active L LE knee flexion and quad set this date s/p series of ther ex. Patient with no reports of lightheadedness. Patient safe to return home with spouse this date when MD approves.  PT Plan Discharge plan remains appropriate;Frequency remains appropriate  PT Frequency 7X/week  Follow Up Recommendations Home health PT;Supervision/Assistance - 24 hour  Equipment Recommended Rolling walker  with 5" wheels;3 in 1 bedside comode (DME has been delivered)  Acute Rehab PT Goals  PT Goal: Sit to Supine/Side - Progress Progressing toward goal  PT Goal: Sit to Stand - Progress Progressing toward goal  PT Goal: Stand to Sit - Progress Progressing toward goal  PT Goal: Ambulate - Progress Progressing toward goal  PT Goal: Up/Down Stairs - Progress Progressing toward goal  PT Goal: Perform Home Exercise Program - Progress Progressing toward goal    Pain: Patient reports more soreness than pain in L knee. Patient con't to report increased tightness in L LE from swelling and at incision.  [name], PT, DPT Pager #: [phone] Office #: [phone]

## 2011-12-02 NOTE — Progress Notes (Signed)
Subjective: 5 Days Post-Op Procedure(s) (LRB): TOTAL KNEE ARTHROPLASTY (Left) Patient reports pain as mild.    Objective: Vital signs in last 24 hours: Temp:  [98.5 F (36.9 C)] 98.5 F (36.9 C) (04/03 0507) Pulse Rate:  [80-95] 92  (04/03 0507) Resp:  [18] 18  (04/03 0507) BP: (112-136)/(63-72) 112/63 mmHg (04/03 0507) SpO2:  [97 %-100 %] 98 % (04/03 0507)  Intake/Output from previous day: 04/02 0701 - 04/03 0700 In: -  Out: 1050 [Urine:1050] Intake/Output this shift: Total I/O In: -  Out: 150 [Urine:150]   Basename 12/02/11 0540 12/01/11 0515 11/30/11 0530  HGB 9.5* 9.7* 8.0*    Basename 12/02/11 0540 12/01/11 0515  WBC 5.4 7.0  RBC 3.23* 3.28*  HCT 28.4* 28.9*  PLT 157 128*    Basename 12/02/11 0540 12/01/11 0515  NA 137 138  K 3.8 4.0  CL 102 103  CO2 27 28  BUN 14 16  CREATININE 0.96 1.06  GLUCOSE 104* 110*  CALCIUM 8.5 8.6   No results found for this basename: LABPT:2,INR:2 in the last 72 hours  Dorsiflexion/Plantar flexion intact Incision: dressing C/D/I Compartment soft  Assessment/Plan: 5 Days Post-Op Procedure(s) (LRB): TOTAL KNEE ARTHROPLASTY (Left) Discharge home with home health Discussed DVT proph with Dr. French Ana and cardiologist Dr. Martinique pt will cont home ASA dose, start back on home plavix, also will do Lovenox 40mg  daily for 2 wks post op.   Chriss Czar 12/02/2011, 1:11 PM

## 2011-12-02 NOTE — Progress Notes (Signed)
Preliminary Results  Left lower extremity venous duplex completed. No evidence of deep vein thrombosis of the left lower extremity and right common femoral vein.  Luna Kitchens, [place], [place] 12/02/2011

## 2011-12-03 ENCOUNTER — Other Ambulatory Visit: Payer: Self-pay

## 2011-12-03 ENCOUNTER — Emergency Department (HOSPITAL_COMMUNITY)
Admission: EM | Admit: 2011-12-03 | Discharge: 2011-12-03 | Disposition: A | Discharge status: Home / Self Care | Payer: Medicare Other | Attending: Emergency Medicine | Admitting: Emergency Medicine

## 2011-12-03 ENCOUNTER — Encounter (HOSPITAL_COMMUNITY): Payer: Self-pay

## 2011-12-03 DIAGNOSIS — E039 Hypothyroidism, unspecified: Secondary | ICD-10-CM | POA: Insufficient documentation

## 2011-12-03 DIAGNOSIS — I10 Essential (primary) hypertension: Secondary | ICD-10-CM | POA: Insufficient documentation

## 2011-12-03 DIAGNOSIS — I951 Orthostatic hypotension: Secondary | ICD-10-CM | POA: Insufficient documentation

## 2011-12-03 DIAGNOSIS — I2510 Atherosclerotic heart disease of native coronary artery without angina pectoris: Secondary | ICD-10-CM | POA: Insufficient documentation

## 2011-12-03 DIAGNOSIS — E869 Volume depletion, unspecified: Secondary | ICD-10-CM | POA: Insufficient documentation

## 2011-12-03 DIAGNOSIS — Z950 Presence of cardiac pacemaker: Secondary | ICD-10-CM | POA: Insufficient documentation

## 2011-12-03 DIAGNOSIS — Z96659 Presence of unspecified artificial knee joint: Secondary | ICD-10-CM | POA: Insufficient documentation

## 2011-12-03 LAB — BASIC METABOLIC PANEL
BUN: 20 mg/dL (ref 6–23)
CO2: 25 mEq/L (ref 19–32)
Calcium: 8.5 mg/dL (ref 8.4–10.5)
Chloride: 101 mEq/L (ref 96–112)
Creatinine, Ser: 1 mg/dL (ref 0.50–1.35)
GFR calc Af Amer: 87 mL/min — ABNORMAL LOW (ref >90)
GFR calc non Af Amer: 75 mL/min — ABNORMAL LOW (ref >90)
Glucose, Bld: 172 mg/dL — ABNORMAL HIGH (ref 70–99)
Potassium: 4 mEq/L (ref 3.5–5.1)
Sodium: 136 mEq/L (ref 135–145)

## 2011-12-03 LAB — CBC
HCT: 29.5 % — ABNORMAL LOW (ref 39.0–52.0)
Hemoglobin: 9.9 g/dL — ABNORMAL LOW (ref 13.0–17.0)
MCH: 29.9 pg (ref 26.0–34.0)
MCHC: 33.6 g/dL (ref 30.0–36.0)
MCV: 89.1 fL (ref 78.0–100.0)
Platelets: 184 10*3/uL (ref 150–400)
RBC: 3.31 MIL/uL — ABNORMAL LOW (ref 4.22–5.81)
RDW: 13.8 % (ref 11.5–15.5)
WBC: 6.4 10*3/uL (ref 4.0–10.5)

## 2011-12-03 LAB — TYPE AND SCREEN
ABO/RH(D): O NEG
Antibody Screen: NEGATIVE

## 2011-12-03 MED ORDER — ACETAMINOPHEN 325 MG PO TABS
650.0000 mg | ORAL_TABLET | Freq: Once | ORAL | Status: AC
  Administered 2011-12-03: 650 mg via ORAL
  Filled 2011-12-03: qty 2

## 2011-12-03 MED ORDER — SODIUM CHLORIDE 0.9 % IV BOLUS (SEPSIS)
1000.0000 mL | Freq: Once | INTRAVENOUS | Status: AC
  Administered 2011-12-03: 1000 mL via INTRAVENOUS

## 2011-12-03 MED ORDER — MORPHINE SULFATE 4 MG/ML IJ SOLN
6.0000 mg | Freq: Once | INTRAMUSCULAR | Status: AC
  Administered 2011-12-03: 6 mg via INTRAVENOUS
  Filled 2011-12-03: qty 2

## 2011-12-03 NOTE — ED Notes (Signed)
Pt d/c home in NAD. Pt voiced understanding of d/c instructions. Escorted pt to vehicle and assisted in loading into seat.

## 2011-12-03 NOTE — ED Notes (Signed)
Pt given snack and sprite per Dr. Venora Maples

## 2011-12-03 NOTE — ED Provider Notes (Signed)
History     CSN: [id number]  Arrival date & time 12/03/11  [id number]   First MD Initiated Contact with Patient 12/03/11 [phone]      Chief Complaint  Patient presents with  . Loss of Consciousness    (Consider location/radiation/quality/duration/timing/severity/associated sxs/prior treatment) The history is provided by the patient, the spouse and medical records.   patient was discharged from the hospital yesterday after left knee replacement.  He was transfused blood 3 days ago and yesterday felt lightheaded once.  He awoke this morning was lightheaded when he stood up to go the bathroom.  After standing up after using the restroom he became lightheaded and was lowered to the ground and had a syncopal episode.  He denies chest pain palpitations.  Has no shortness of breath.  He denies melena and hematochezia.  He is on Plavix for DVT prophylaxis.  He has no complaints regarding his knee.  He does have a history of coronary artery disease status post stents.  He has no chest pain at this time.  He reports he ate and drank some yesterday evening but his appetite was somewhat diminished.  He has had no fever or chills.  He has no prior history of DVT or PE.  He reports no increased swelling in his left lower extremity  Past Medical History  Diagnosis Date  . Ischemic cardiomyopathy     a. 10/09 Echo: Sev LV Dysfxn, inf/lat AK, Mod MR.  [name] Hypercholesteremia   . Osteoarthritis     a. s/p   . Anxiety   . Hypothyroidism   . CAD (coronary artery disease)     a. s/p Lat MI 2009  . ICD (implantable cardiac defibrillator) in place     PROPHYLACTIC      medtronic  . RBBB   . 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 Hypertension   . PVC (premature ventricular contraction)   . Pacemaker     Past Surgical History  Procedure Date  . Defibrillator 2009  . Total knee arthroplasty 11/27/11    left  . Insert / replace / remove pacemaker 2009  . Coronary angioplasty with stent placement  2004  . Coronary angioplasty with stent placement 07/2008  . Knee arthroscopy w/ partial medial meniscectomy 12/2002    left  . Knee arthroscopy     bilaterally  . Total knee arthroplasty 11/27/2011    Procedure: TOTAL KNEE ARTHROPLASTY;  Surgeon: Yvette Rack., MD;  Location: [place];  Service: Orthopedics;  Laterality: Left;  left total knee arthroplasty    Family History  Problem Relation Age of Onset  . Heart failure Father   . Stroke Father   . Coronary artery disease Mother   . Heart disease Mother   . Hyperlipidemia Sister     History  Substance Use Topics  . Smoking status: Never Smoker   . Smokeless tobacco: Never Used  . Alcohol Use: Yes     "occassionlly; last beer was 09/2011"      Review of Systems  All other systems reviewed and are negative.    Allergies  Diovan and Lipitor  Home Medications   Current Outpatient Rx  Name Route Sig Dispense Refill  . ASPIRIN 325 MG PO TABS Oral Take 325 mg by mouth daily.      [name] CARVEDILOL 12.5 MG PO TABS Oral Take 1 tablet (12.5 mg total) by mouth 2 (two) times daily with a meal. 180 tablet  3  . CLOPIDOGREL BISULFATE 75 MG PO TABS Oral Take 1 tablet (75 mg total) by mouth daily. 90 tablet 3  . CO Q-10 PO Oral Take 1 capsule by mouth daily.    [name] EZETIMIBE 10 MG PO TABS Oral Take 1 tablet (10 mg total) by mouth daily. 90 tablet 3  . LEVOTHYROXINE SODIUM 150 MCG PO TABS Oral Take 150 mcg by mouth daily.    . OXYCODONE-ACETAMINOPHEN 5-325 MG PO TABS Oral Take 0.5-1 tablets by mouth 2 (two) times daily as needed. For pain.    [name] ROSUVASTATIN CALCIUM 20 MG PO TABS Oral Take 1 tablet (20 mg total) by mouth daily. 90 tablet 3  . NITROGLYCERIN 0.4 MG SL SUBL Sublingual Place 0.4 mg under the tongue every 5 (five) minutes as needed. For chest pain      BP 106/66  Pulse 92  Temp(Src) 98.3 F (36.8 C) (Oral)  Resp 15  SpO2 100%  Physical Exam  Nursing note and vitals reviewed. Constitutional: He is oriented to person,  place, and time. He appears well-developed and well-nourished.  HENT:  Head: Normocephalic and atraumatic.  Eyes: EOM are normal.  Neck: Normal range of motion.  Cardiovascular: Normal rate, regular rhythm, normal heart sounds and intact distal pulses.   Pulmonary/Chest: Effort normal and breath sounds normal. No respiratory distress.  Abdominal: Soft. He exhibits no distension. There is no tenderness.  Musculoskeletal:       Left knee with anterior staples in place.  There is no wound drainage erythema or focal tenderness.  There is normal postoperative swelling of his left knee and lower extremity.  The patient has his compression stockings on bilaterally.  Neurological: He is alert and oriented to person, place, and time.  Skin: Skin is warm and dry.  Psychiatric: He has a normal mood and affect. Judgment normal.    ED Course  Procedures (including critical care time)   Date: 12/03/2011  Rate: 92  Rhythm: normal sinus rhythm  QRS Axis: normal  Intervals: Right bundle branch block  ST/T Wave abnormalities: normal  Conduction Disutrbances: none  Narrative Interpretation: PVC  Old EKG Reviewed: No significant changes noted     Labs Reviewed  CBC - Abnormal; Notable for the following:    RBC 3.31 (*)    Hemoglobin 9.9 (*)    HCT 29.5 (*)    All other components within normal limits  BASIC METABOLIC PANEL - Abnormal; Notable for the following:    Glucose, Bld 172 (*)    GFR calc non Af Amer 75 (*)    GFR calc Af Amer 87 (*)    All other components within normal limits  TYPE AND SCREEN   No results found.   1. Volume depletion   2. Orthostasis       MDM  The patient feels much better after 2 L of IV fluids.  He is able to stand at the bedside without any lightheadedness.  His EKG and blood work are within normal limits.  His hemoglobin is better than his posttransfusion hemoglobin.  Close PCP and orthopedic followup.  Patient and spouse agreeable to outpatient plan  and understand to return to the ER for new or worsening symptoms  12:47 PM Dc home in good condition       Hoy Morn, MD 12/03/11 1247

## 2011-12-03 NOTE — ED Notes (Signed)
MD at bedside. 

## 2011-12-03 NOTE — ED Notes (Signed)
Pt wife states pt was standing up and collapsed against her chest. Pt eyes were rolled back in head and pt has no memory of incident. Pt states "I took all of my medicines this am and I shouldn't have done that with my blood pressure being all over the place." Pt states that he received 2 units of blood during this previous hospital admission.

## 2011-12-03 NOTE — ED Notes (Signed)
Pt has incision to left knee. Dressing is dry and intact. NAD noted

## 2011-12-03 NOTE — ED Notes (Signed)
Per ems- pt has syncopal episode this am. Pt wife saw pt begin to fall and she lowered pt to ground. Pt pressure on EMS arrival was 70/46. 20g RAC IV placed, and pt received 400cc of fluid. Pt cbg: 122. Pt was diaphoretic during episode and pt does not remember incident. Pt does c/o dizziness when standing. Pt was released from hospital yesterday for knee replacement.

## 2011-12-29 ENCOUNTER — Telehealth: Payer: Self-pay | Admitting: Cardiology

## 2011-12-29 NOTE — Telephone Encounter (Signed)
LMTCB--nt

## 2011-12-29 NOTE — Telephone Encounter (Signed)
Phoned pt's wife back as i had not heard back from her and advised her to call surgeon as pt has had these same type symptoms before around 4/4 and ended up going to hosp for blood transfusion --his hgb at that time was 9.9--wife agrees to phone surgeon and advised if can't get surgeon, may go to nearest ED--NT

## 2011-12-29 NOTE — Telephone Encounter (Signed)
Spoke with Johnny Oneill who states she called dr caffery(orthopod), who stated blood loss this long after surgery would not cause this problem--i phoned wife back and she states she thinks Johnny Oneill needs to seen--advised we have sched an appoint for him tomorrow with lori gerhardt n.p., and dr Neita Garnet will be in office to consult if needed--wife agrees--nt

## 2011-12-29 NOTE — Telephone Encounter (Signed)
PT' WIFE CALLING BACK BECAUSE SHE THINKS HIS SYMPTOMS ARE HEART RELATED, DENIES SOB, CHEST PAIN OR PRESSURE, FEELING LIGHTHEADED, DIZZY AND FAINT

## 2011-12-29 NOTE — Telephone Encounter (Signed)
Please return call to patient wife [phone]  Patient had knee replacement 3/29, patient fatigue, weak, and dizzy. No SOB or chest to report at this time.  Please return this call to patient wife [name] at cell [phone] ASAP, patient wants to be seen today.

## 2011-12-30 ENCOUNTER — Encounter: Payer: Self-pay | Admitting: Nurse Practitioner

## 2011-12-30 ENCOUNTER — Ambulatory Visit (INDEPENDENT_AMBULATORY_CARE_PROVIDER_SITE_OTHER): Payer: Medicare Other | Admitting: Nurse Practitioner

## 2011-12-30 VITALS — BP 128/82 | HR 78 | Ht 67.0 in | Wt 162.0 lb

## 2011-12-30 DIAGNOSIS — I2589 Other forms of chronic ischemic heart disease: Secondary | ICD-10-CM

## 2011-12-30 DIAGNOSIS — I255 Ischemic cardiomyopathy: Secondary | ICD-10-CM

## 2011-12-30 DIAGNOSIS — R42 Dizziness and giddiness: Secondary | ICD-10-CM

## 2011-12-30 DIAGNOSIS — D62 Acute posthemorrhagic anemia: Secondary | ICD-10-CM

## 2011-12-30 NOTE — Assessment & Plan Note (Signed)
Patient presents with some dizziness and lightheadedness that is associated with a lower blood pressure. He has no other symptoms. I have left him on his half dose of his Coreg. He has lost 10 pounds which I think has influenced his symptoms. We will recheck his labs today to make sure he is not still anemic. I have asked him to monitor his blood pressure at home. Hopefully once we get some more time behind up from a post op standpoint, we could try to increase the Coreg back up. No other changes in his medicines. I will see him back in about 2 months. Patient is agreeable to this plan and will call if any problems develop in the interim.

## 2011-12-30 NOTE — Progress Notes (Signed)
Johnny Oneill Date of Birth: [birth year] Medical Record [id number]  History of Present Illness: [name] is seen today for a work in visit. He is seen for Dr. Martinique. He has known ischemic heart disease with prior stenting of the LCX in 2004, suffered occlusion of the LCX with a lateral MI in 2009 and had repeat stenting. He is on chronic Plavix. His RCA is chronically occluded.  He does have a reduced EF of about 35%. Has an ICD in place.   He comes in today. He is here with his wife. He notes that since his last visit here, he has had right TKR. He did require transfusion during that admission. Was orthostatic as well. After discharge home, he had a syncopal spell while trying to have a BM. He was still orthostatic. His Coreg was cut back. He did fine then with his rehab at home. This week he went back to work. He also went back to the gym to exercise. He increased his Coreg back on his own over the weekend. Tuesday he began getting dizzy and lightheaded again. Blood pressure dropped back. Running in the [id number] systolic. He cut the Coreg back yesterday and now feels better. He is concerned. He has lost about 10 pounds with his surgery. He is not having chest pain. Not short of breath. No ICD shocks.   Current Outpatient Prescriptions on File Prior to Visit  Medication Sig Dispense Refill  . aspirin 325 MG tablet Take 325 mg by mouth daily.        . clopidogrel (PLAVIX) 75 MG tablet Take 1 tablet (75 mg total) by mouth daily.  90 tablet  3  . Coenzyme Q10 (CO Q-10 PO) Take 1 capsule by mouth daily.      [name] ezetimibe (ZETIA) 10 MG tablet Take 1 tablet (10 mg total) by mouth daily.  90 tablet  3  . levothyroxine (SYNTHROID, LEVOTHROID) 150 MCG tablet Take 150 mcg by mouth daily.      . nitroGLYCERIN (NITROSTAT) 0.4 MG SL tablet Place 0.4 mg under the tongue every 5 (five) minutes as needed. For chest pain      . oxyCODONE-acetaminophen (PERCOCET) 5-325 MG per tablet Take 0.5-1 tablets by mouth 2  (two) times daily as needed. For pain.      . rosuvastatin (CRESTOR) 20 MG tablet Take 1 tablet (20 mg total) by mouth daily.  90 tablet  3  . DISCONTD: carvedilol (COREG) 12.5 MG tablet Take 1 tablet (12.5 mg total) by mouth 2 (two) times daily with a meal.  180 tablet  3    Allergies  Allergen Reactions  . Diovan (Valsartan) Other (See Comments)    Hypotension at low dose, hyperkalemia  . Lipitor (Atorvastatin Calcium) Other (See Comments)    Muscle pain    Past Medical History  Diagnosis Date  . Ischemic cardiomyopathy     a. 10/09 Echo: Sev LV Dysfxn, inf/lat AK, Mod MR.  [name];  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  Hypertension   . PVC (premature ventricular contraction)   . Pacemaker     Past Surgical History  Procedure Date  . Defibrillator 2009  . Total knee arthroplasty 11/27/11    left  . Insert / replace / remove pacemaker 2009  . Coronary angioplasty with stent placement 2004  . Coronary angioplasty with stent placement 07/2008  . Knee arthroscopy w/ partial medial meniscectomy 12/2002    left  . Knee arthroscopy     bilaterally  . Total knee arthroplasty 11/27/2011    Procedure: TOTAL KNEE ARTHROPLASTY;  Surgeon: Yvette Rack., MD;  Location: [place];  Service: Orthopedics;  Laterality: Left;  left total knee arthroplasty    History  Smoking status  . Never Smoker   Smokeless tobacco  . Never Used    History  Alcohol Use  . Yes    "occassionlly; last beer was 09/2011"    Family History  Problem Relation Age of Onset  . Heart failure Father   . Stroke Father   . Coronary artery disease Mother   . Heart  disease Mother   . Hyperlipidemia Sister     Review of Systems: The review of systems is per the HPI.  All other systems were reviewed and are negative.  Physical Exam: BP 128/82  Pulse 78  Ht 5\' 7"  (1.702 m)  Wt 162 lb (73.483 kg)  BMI 25.37 kg/m2  SpO2 98% Patient is very pleasant and in no acute distress. He is a little anxious. Skin is warm and dry. Color is normal.  HEENT is unremarkable. Normocephalic/atraumatic. PERRL. Sclera are nonicteric. Neck is supple. No masses. No JVD. Lungs are clear. Cardiac exam shows a regular rate and rhythm. Abdomen is soft. Extremities are without edema. Gait and ROM are intact. He is using a cane. No gross neurologic deficits noted.   LABORATORY DATA: PENDING   Assessment / Plan:

## 2011-12-30 NOTE — Patient Instructions (Addendum)
We are going to check some labs today  Stay on just the half dose of your Coreg for now. We will readdress this in about 2 months.  Monitor your blood pressure at home  Call the [facility] office at [phone] if you have any questions, problems or concerns.

## 2011-12-30 NOTE — Assessment & Plan Note (Signed)
We will be rechecking his labs today.

## 2011-12-31 LAB — CBC WITH DIFFERENTIAL/PLATELET
Basophils Absolute: 0 10*3/uL (ref 0.0–0.1)
Basophils Relative: 0.5 % (ref 0.0–3.0)
Eosinophils Absolute: 0.1 10*3/uL (ref 0.0–0.7)
Eosinophils Relative: 1.5 % (ref 0.0–5.0)
HCT: 41.9 % (ref 39.0–52.0)
Hemoglobin: 13.8 g/dL (ref 13.0–17.0)
Lymphocytes Relative: 32.2 % (ref 12.0–46.0)
Lymphs Abs: 2 10*3/uL (ref 0.7–4.0)
MCHC: 32.9 g/dL (ref 30.0–36.0)
MCV: 91.5 fl (ref 78.0–100.0)
Monocytes Absolute: 0.6 10*3/uL (ref 0.1–1.0)
Monocytes Relative: 9.7 % (ref 3.0–12.0)
Neutro Abs: 3.4 10*3/uL (ref 1.4–7.7)
Neutrophils Relative %: 56.1 % (ref 43.0–77.0)
Platelets: 163 10*3/uL (ref 150.0–400.0)
RBC: 4.58 Mil/uL (ref 4.22–5.81)
RDW: 16.2 % — ABNORMAL HIGH (ref 11.5–14.6)
WBC: 6.1 10*3/uL (ref 4.5–10.5)

## 2011-12-31 LAB — BASIC METABOLIC PANEL
BUN: 21 mg/dL (ref 6–23)
CO2: 29 mEq/L (ref 19–32)
Calcium: 9.2 mg/dL (ref 8.4–10.5)
Chloride: 105 mEq/L (ref 96–112)
Creatinine, Ser: 1.2 mg/dL (ref 0.4–1.5)
GFR: 65.82 mL/min (ref >60.00)
Glucose, Bld: 94 mg/dL (ref 70–99)
Potassium: 4.1 mEq/L (ref 3.5–5.1)
Sodium: 141 mEq/L (ref 135–145)

## 2012-01-28 ENCOUNTER — Encounter: Payer: Medicare Other | Admitting: *Deleted

## 2012-02-02 ENCOUNTER — Encounter: Payer: Self-pay | Admitting: *Deleted

## 2012-02-16 ENCOUNTER — Telehealth: Payer: Self-pay | Admitting: Cardiology

## 2012-02-16 NOTE — Telephone Encounter (Signed)
No answer when attempted to call back.  Pt has a Medtronic ICD with no recent carelink transmissions.

## 2012-02-16 NOTE — Telephone Encounter (Signed)
Please return call to patient at [phone], patient needs help with remote transmission.  Wife notes there was some issues with the phones line and maybe that's why the transmission did not go through.

## 2012-02-19 NOTE — Telephone Encounter (Signed)
Instructed pt to send manual transmission due to scheduled transmission not going thru. Pt's wife said they had been having phone issues which is now resolved.

## 2012-02-23 ENCOUNTER — Encounter: Payer: Self-pay | Admitting: Internal Medicine

## 2012-02-23 ENCOUNTER — Ambulatory Visit (INDEPENDENT_AMBULATORY_CARE_PROVIDER_SITE_OTHER): Payer: Medicare Other | Admitting: *Deleted

## 2012-02-23 DIAGNOSIS — Z95810 Presence of automatic (implantable) cardiac defibrillator: Secondary | ICD-10-CM

## 2012-02-23 DIAGNOSIS — I2589 Other forms of chronic ischemic heart disease: Secondary | ICD-10-CM

## 2012-02-23 DIAGNOSIS — I5022 Chronic systolic (congestive) heart failure: Secondary | ICD-10-CM

## 2012-02-23 DIAGNOSIS — I255 Ischemic cardiomyopathy: Secondary | ICD-10-CM

## 2012-02-26 LAB — REMOTE ICD DEVICE
AL AMPLITUDE: 2.1 mv
AL IMPEDENCE ICD: 448 Ohm
ATRIAL PACING ICD: 12.21 pct
BAMS-0001: 170 {beats}/min
BATTERY VOLTAGE: 3.07 V
CHARGE TIME: 10.169 s
DEV-0020ICD: NEGATIVE
FVT: 0
PACEART VT: 0
RV LEAD AMPLITUDE: 6 mv
RV LEAD IMPEDENCE ICD: 376 Ohm
TOT-0001: 1
TOT-0002: 0
TOT-0006: 20091103000000
TZAT-0001ATACH: 1
TZAT-0001ATACH: 2
TZAT-0001ATACH: 3
TZAT-0001FASTVT: 1
TZAT-0001SLOWVT: 1
TZAT-0001SLOWVT: 2
TZAT-0002ATACH: NEGATIVE
TZAT-0002ATACH: NEGATIVE
TZAT-0002ATACH: NEGATIVE
TZAT-0002FASTVT: NEGATIVE
TZAT-0004SLOWVT: 8
TZAT-0004SLOWVT: 8
TZAT-0005SLOWVT: 88 pct
TZAT-0005SLOWVT: 91 pct
TZAT-0011SLOWVT: 10 ms
TZAT-0011SLOWVT: 10 ms
TZAT-0012ATACH: 150 ms
TZAT-0012ATACH: 150 ms
TZAT-0012ATACH: 150 ms
TZAT-0012FASTVT: 200 ms
TZAT-0012SLOWVT: 200 ms
TZAT-0012SLOWVT: 200 ms
TZAT-0013SLOWVT: 2
TZAT-0013SLOWVT: 2
TZAT-0018ATACH: NEGATIVE
TZAT-0018ATACH: NEGATIVE
TZAT-0018ATACH: NEGATIVE
TZAT-0018FASTVT: NEGATIVE
TZAT-0018SLOWVT: NEGATIVE
TZAT-0018SLOWVT: NEGATIVE
TZAT-0019ATACH: 6 V
TZAT-0019ATACH: 6 V
TZAT-0019ATACH: 6 V
TZAT-0019FASTVT: 8 V
TZAT-0019SLOWVT: 8 V
TZAT-0019SLOWVT: 8 V
TZAT-0020ATACH: 1.5 ms
TZAT-0020ATACH: 1.5 ms
TZAT-0020ATACH: 1.5 ms
TZAT-0020FASTVT: 1.5 ms
TZAT-0020SLOWVT: 1.5 ms
TZAT-0020SLOWVT: 1.5 ms
TZON-0003ATACH: 350 ms
TZON-0003SLOWVT: 350 ms
TZON-0003VSLOWVT: 350 ms
TZON-0004SLOWVT: 40
TZON-0004VSLOWVT: 44
TZON-0005SLOWVT: 12
TZST-0001ATACH: 4
TZST-0001ATACH: 5
TZST-0001ATACH: 6
TZST-0001FASTVT: 2
TZST-0001FASTVT: 3
TZST-0001FASTVT: 4
TZST-0001FASTVT: 5
TZST-0001FASTVT: 6
TZST-0001SLOWVT: 3
TZST-0001SLOWVT: 4
TZST-0001SLOWVT: 5
TZST-0001SLOWVT: 6
TZST-0002ATACH: NEGATIVE
TZST-0002ATACH: NEGATIVE
TZST-0002ATACH: NEGATIVE
TZST-0002FASTVT: NEGATIVE
TZST-0002FASTVT: NEGATIVE
TZST-0002FASTVT: NEGATIVE
TZST-0002FASTVT: NEGATIVE
TZST-0002FASTVT: NEGATIVE
TZST-0003SLOWVT: 20 J
TZST-0003SLOWVT: 25 J
TZST-0003SLOWVT: 35 J
TZST-0003SLOWVT: 35 J
VENTRICULAR PACING ICD: 0.07 pct
VF: 0

## 2012-02-29 ENCOUNTER — Ambulatory Visit (INDEPENDENT_AMBULATORY_CARE_PROVIDER_SITE_OTHER): Payer: Medicare Other | Admitting: Nurse Practitioner

## 2012-02-29 ENCOUNTER — Encounter: Payer: Self-pay | Admitting: Nurse Practitioner

## 2012-02-29 ENCOUNTER — Ambulatory Visit: Payer: Medicare Other | Admitting: Nurse Practitioner

## 2012-02-29 VITALS — BP 116/68 | HR 82 | Ht 67.0 in | Wt 168.0 lb

## 2012-02-29 DIAGNOSIS — I5022 Chronic systolic (congestive) heart failure: Secondary | ICD-10-CM

## 2012-02-29 DIAGNOSIS — I255 Ischemic cardiomyopathy: Secondary | ICD-10-CM

## 2012-02-29 DIAGNOSIS — I2589 Other forms of chronic ischemic heart disease: Secondary | ICD-10-CM

## 2012-02-29 MED ORDER — CARVEDILOL 6.25 MG PO TABS: ORAL_TABLET | ORAL | Status: DC

## 2012-02-29 NOTE — Assessment & Plan Note (Signed)
He is doing very well clinically. He is on 6.25 mg of Coreg BID. BP has come up and is primarily in the 130's at home. He has also put 6 pounds of weight back on. We will try him on 9.875 mg of Coreg BID. He is to continue to monitor his blood pressures at home. He will let us know if he has any dizzy spells. We will see him back in about 3 months. No other change in his regimen today. Patient is agreeable to this plan and will call if any problems develop in the interim.

## 2012-02-29 NOTE — Assessment & Plan Note (Signed)
Currently well compensated

## 2012-02-29 NOTE — Progress Notes (Signed)
Johnny Oneill Date of Birth: [birth year] Medical Record [id number]  History of Present Illness: Johnny Oneill is seen back today for a 2 month check. He is seen for Johnny Oneill. He has known ischemic heart disease with prior stenting of the LCX in 2004, suffered occlusion of the LCX with a lateral MI in 2009 and had repeat stenting. EF is down at 35%. He is on chronic Plavix. His RCA is chronically occluded. He has an ICD in place. He has had prior TKR and had orthostasis/anemia post op. His Coreg was cut back. His weight was down 10 pounds. He tried going back to his old dose of 12.5 mg Coreg and got dizzy and lightheaded with BP's down in the [id number] systolic.  He comes in today. He is doing well. Weight is back up 6 pounds. His blood pressure diary is reviewed. His readings are up in the 120 and 130's. He has had no lightheaded spells. Did have food poisoning last month from chicken salad. He is otherwise doing well. No chest pain or heart failure symptoms. He does try to be proactive with his health and wants to be on the target doses of his medicines.   Current Outpatient Prescriptions on File Prior to Visit  Medication Sig Dispense Refill  . aspirin 325 MG tablet Take 325 mg by mouth daily.        . clopidogrel (PLAVIX) 75 MG tablet Take 1 tablet (75 mg total) by mouth daily.  90 tablet  3  . Coenzyme Q10 (CO Q-10 PO) Take 1 capsule by mouth daily.      [name] ezetimibe (ZETIA) 10 MG tablet Take 1 tablet (10 mg total) by mouth daily.  90 tablet  3  . levothyroxine (SYNTHROID, LEVOTHROID) 150 MCG tablet Take 150 mcg by mouth daily.      . nitroGLYCERIN (NITROSTAT) 0.4 MG SL tablet Place 0.4 mg under the tongue every 5 (five) minutes as needed. For chest pain      . oxyCODONE-acetaminophen (PERCOCET) 5-325 MG per tablet Take 0.5-1 tablets by mouth 2 (two) times daily as needed. For pain.      . rosuvastatin (CRESTOR) 20 MG tablet Take 1 tablet (20 mg total) by mouth daily.  90 tablet  3    Allergies    Allergen Reactions  . Diovan (Valsartan) Other (See Comments)    Hypotension at low dose, hyperkalemia  . Lipitor (Atorvastatin Calcium) Other (See Comments)    Muscle pain    Past Medical History  Diagnosis Date  . Ischemic cardiomyopathy     a. 10/09 Echo: Sev LV Dysfxn, inf/lat AK, Mod MR.  [name];  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 Hypertension   . PVC (premature ventricular contraction)   . Pacemaker     Past Surgical History  Procedure Date  . Defibrillator 2009  . Total knee arthroplasty 11/27/11    left  . Insert / replace / remove pacemaker 2009  . Coronary angioplasty with stent placement 2004  .  Coronary angioplasty with stent placement 07/2008  . Knee arthroscopy w/ partial medial meniscectomy 12/2002    left  . Knee arthroscopy     bilaterally  . Total knee arthroplasty 11/27/2011    Procedure: TOTAL KNEE ARTHROPLASTY;  Surgeon: Yvette Rack., MD;  Location: [facility];  Service: Orthopedics;  Laterality: Left;  left total knee arthroplasty    History  Smoking status  . Never Smoker   Smokeless tobacco  . Never Used    History  Alcohol Use  . Yes    "occassionlly; last beer was 09/2011"    Family History  Problem Relation Age of Onset  . Heart failure Father   . Stroke Father   . Coronary artery disease Mother   . Heart disease Mother   . Hyperlipidemia Sister     Review of Systems: The review of systems is per the HPI.  All other systems were reviewed and are negative.  Physical Exam: BP 116/68  Pulse 82  Ht 5\' 7"  (1.702 m)  Wt 168 lb (76.204 kg)  BMI 26.31 kg/m2 Patient is very pleasant and in  no acute distress. Skin is warm and dry. Color is normal.  HEENT is unremarkable. Normocephalic/atraumatic. PERRL. Sclera are nonicteric. Neck is supple. No masses. No JVD. Lungs are clear. Cardiac exam shows a regular rate and rhythm. Abdomen is soft. Extremities are without edema. Gait and ROM are intact. No gross neurologic deficits noted.  LABORATORY DATA: N/A   Assessment / Plan:

## 2012-02-29 NOTE — Patient Instructions (Addendum)
We are going to increase your Coreg to 9.875 mg two times a day  We will see you in 3 months.  Monitor your blood pressure at home. Let us know if your BP goes back in the 90's or if you feel bad.  Call the [facility] office at [phone] if you have any questions, problems or concerns.

## 2012-03-14 ENCOUNTER — Encounter: Payer: Self-pay | Admitting: *Deleted

## 2012-05-30 ENCOUNTER — Encounter: Payer: Medicare Other | Admitting: *Deleted

## 2012-05-31 ENCOUNTER — Encounter: Payer: Self-pay | Admitting: Nurse Practitioner

## 2012-05-31 ENCOUNTER — Ambulatory Visit (INDEPENDENT_AMBULATORY_CARE_PROVIDER_SITE_OTHER): Payer: Medicare Other | Admitting: Nurse Practitioner

## 2012-05-31 VITALS — BP 128/78 | HR 75 | Ht 67.0 in | Wt 167.1 lb

## 2012-05-31 DIAGNOSIS — I2589 Other forms of chronic ischemic heart disease: Secondary | ICD-10-CM

## 2012-05-31 DIAGNOSIS — I255 Ischemic cardiomyopathy: Secondary | ICD-10-CM

## 2012-05-31 NOTE — Patient Instructions (Addendum)
Stay on your current medicines  We will see you back for your recall visit with Dr. Martinique next month  Stay active  Call the [facility] office at [phone] if you have any questions, problems or concerns.

## 2012-05-31 NOTE — Progress Notes (Signed)
Johnny Oneill Date of Birth: Jan 26, 1941 Medical Record [id number]  History of Present Illness: [name] is seen back today for a 3 month check. He is seen for Dr. Martinique. He has known ischemic heart disease with prior stenting of the LCX in 2004, suffered occlusion of the LCX with a lateral MI in 2009 and had repeat stenting. EF is down to 35%. He has an ICD in place. On chronic Plavix. His RCA is chronically occluded. He had had prior TKR and had orthostasis/anemia post op. Had lost 10 pounds as well. He required lowering of his Coreg dose due to symptoms of dizziness and presyncope. We were able to increase it back up some at his last visit.  He comes in today. He is here alone. He is doing well. Blood pressure is running lower at home. For the most part he feels well. One spell of dizziness when he went to an am session of rehab instead of his usual afternoon. No chest pain. He feels good on his current regimen. Not short of breath. No shocks.   Current Outpatient Prescriptions on File Prior to Visit  Medication Sig Dispense Refill  . aspirin 325 MG tablet Take 325 mg by mouth daily.        . carvedilol (COREG) 6.25 MG tablet Take one and a half tablets (9.875 mg) two times a day  270 tablet  2  . clopidogrel (PLAVIX) 75 MG tablet Take 1 tablet (75 mg total) by mouth daily.  90 tablet  3  . Coenzyme Q10 (CO Q-10 PO) Take 1 capsule by mouth daily.      [name] ezetimibe (ZETIA) 10 MG tablet Take 1 tablet (10 mg total) by mouth daily.  90 tablet  3  . levothyroxine (SYNTHROID, LEVOTHROID) 150 MCG tablet Take 150 mcg by mouth daily.      . nitroGLYCERIN (NITROSTAT) 0.4 MG SL tablet Place 0.4 mg under the tongue every 5 (five) minutes as needed. For chest pain      . oxyCODONE-acetaminophen (PERCOCET) 5-325 MG per tablet Take 0.5-1 tablets by mouth 2 (two) times daily as needed. For pain.      . rosuvastatin (CRESTOR) 20 MG tablet Take 1 tablet (20 mg total) by mouth daily.  90 tablet  3     Allergies  Allergen Reactions  . Diovan (Valsartan) Other (See Comments)    Hypotension at low dose, hyperkalemia  . Lipitor (Atorvastatin Calcium) Other (See Comments)    Muscle pain    Past Medical History  Diagnosis Date  . Ischemic cardiomyopathy     a. 10/09 Echo: Sev LV Dysfxn, inf/lat AK, Mod MR.  [name];  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 Hypertension   . PVC (premature ventricular contraction)   . Pacemaker     Past Surgical History  Procedure Date  . Defibrillator 2009  . Total knee arthroplasty 11/27/11    left  . Insert / replace / remove pacemaker 2009  . Coronary angioplasty with stent placement  2004  . Coronary angioplasty with stent placement 07/2008  . Knee arthroscopy w/ partial medial meniscectomy 12/2002    left  . Knee arthroscopy     bilaterally  . Total knee arthroplasty 11/27/2011    Procedure: TOTAL KNEE ARTHROPLASTY;  Surgeon: Yvette Rack., MD;  Location: [facility];  Service: Orthopedics;  Laterality: Left;  left total knee arthroplasty    History  Smoking status  . Never Smoker   Smokeless tobacco  . Never Used    History  Alcohol Use  . Yes    "occassionlly; last beer was 09/2011"    Family History  Problem Relation Age of Onset  . Heart failure Father   . Stroke Father   . Coronary artery disease Mother   . Heart disease Mother   . Hyperlipidemia Sister     Review of Systems: The review of systems is per the HPI.  All other systems were reviewed and are negative.  Physical Exam: BP 128/78  Pulse 75  Ht 5\' 7"  (1.702 m)  Wt 167 lb 1.9 oz (75.805 kg)  BMI 26.17 kg/m2 Patient is  very pleasant and in no acute distress. Skin is warm and dry. Color is normal.  HEENT is unremarkable. Normocephalic/atraumatic. PERRL. Sclera are nonicteric. Neck is supple. No masses. No JVD. Lungs are clear. Cardiac exam shows a regular rate and rhythm. Abdomen is soft. Extremities are without edema. Gait and ROM are intact. No gross neurologic deficits noted.   LABORATORY DATA: N/A   Assessment / Plan: 1. Ischemic CM - He is doing well and is asymptomatic at this time. Blood pressure looks better at home. I have left him on his current regimen.   2. CAD - doing well. No symptoms.   He is due to see Dr. Martinique later this year for his regular follow up. He would like to keep that time. No change in his current regimen. Overall, he is felt to be doing well.   Patient is agreeable to this plan and will call if any problems develop in the interim.

## 2012-06-03 ENCOUNTER — Encounter: Payer: Self-pay | Admitting: *Deleted

## 2012-06-10 ENCOUNTER — Encounter: Payer: Self-pay | Admitting: Cardiology

## 2012-07-15 ENCOUNTER — Encounter: Payer: Self-pay | Admitting: Cardiology

## 2012-07-15 ENCOUNTER — Ambulatory Visit (INDEPENDENT_AMBULATORY_CARE_PROVIDER_SITE_OTHER): Payer: Medicare Other | Admitting: Cardiology

## 2012-07-15 VITALS — BP 122/80 | HR 73 | Ht 67.0 in | Wt 169.2 lb

## 2012-07-15 DIAGNOSIS — I5022 Chronic systolic (congestive) heart failure: Secondary | ICD-10-CM

## 2012-07-15 DIAGNOSIS — I2589 Other forms of chronic ischemic heart disease: Secondary | ICD-10-CM

## 2012-07-15 DIAGNOSIS — I2510 Atherosclerotic heart disease of native coronary artery without angina pectoris: Secondary | ICD-10-CM

## 2012-07-15 DIAGNOSIS — E7800 Pure hypercholesterolemia, unspecified: Secondary | ICD-10-CM

## 2012-07-15 DIAGNOSIS — I10 Essential (primary) hypertension: Secondary | ICD-10-CM

## 2012-07-15 NOTE — Patient Instructions (Signed)
Continue your current therapy  I will get your lab work from Dr. Laurann Montana  I will see you in 6 months.

## 2012-07-15 NOTE — Progress Notes (Signed)
Johnny Oneill Date of Birth: [birth year] Medical Record [id number]  History of Present Illness: Johnny Oneill is seen back today for a followup visit. He has known ischemic heart disease with prior stenting of the LCX in 2004, suffered occlusion of the LCX with a lateral MI in 2009 and had repeat stenting. EF is down to 35%. He has an ICD in place. On chronic Plavix. His RCA is chronically occluded. He had had prior TKR at the end of March and was fairly inactive for 3 months. He is trying to get back into shape. He does note some shortness of breath when he is running. He is planning to resume an exercise program at the gym. He has no orthopnea or edema. He denies any palpitations or chest pain. His last ICD check in June showed no significant events. His Optivol was good.  Current Outpatient Prescriptions on File Prior to Visit  Medication Sig Dispense Refill  . aspirin 325 MG tablet Take 325 mg by mouth daily.        . carvedilol (COREG) 6.25 MG tablet Take one and a half tablets (9.875 mg) two times a day  270 tablet  2  . clopidogrel (PLAVIX) 75 MG tablet Take 1 tablet (75 mg total) by mouth daily.  90 tablet  3  . Coenzyme Q10 (CO Q-10 PO) Take 1 capsule by mouth daily.      [name] ezetimibe (ZETIA) 10 MG tablet Take 1 tablet (10 mg total) by mouth daily.  90 tablet  3  . levothyroxine (SYNTHROID, LEVOTHROID) 150 MCG tablet Take 150 mcg by mouth daily.      . nitroGLYCERIN (NITROSTAT) 0.4 MG SL tablet Place 0.4 mg under the tongue every 5 (five) minutes as needed. For chest pain      . oxyCODONE-acetaminophen (PERCOCET) 5-325 MG per tablet Take 0.5-1 tablets by mouth 2 (two) times daily as needed. For pain.      . rosuvastatin (CRESTOR) 20 MG tablet Take 1 tablet (20 mg total) by mouth daily.  90 tablet  3    Allergies  Allergen Reactions  . Diovan (Valsartan) Other (See Comments)    Hypotension at low dose, hyperkalemia  . Lipitor (Atorvastatin Calcium) Other (See Comments)    Muscle pain      Past Medical History  Diagnosis Date  . Ischemic cardiomyopathy     a. 10/09 Echo: Sev LV Dysfxn, inf/lat AK, Mod MR.  [name];  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 Hypertension   . PVC (premature ventricular contraction)   . Pacemaker     Past Surgical History  Procedure Date  . Defibrillator 2009  . Total knee arthroplasty 11/27/11    left  . Insert / replace / remove pacemaker 2009  . Coronary angioplasty with stent placement 2004  . Coronary angioplasty with stent placement 07/2008  . Knee arthroscopy w/ partial medial meniscectomy 12/2002    left  . Knee arthroscopy     bilaterally  . Total knee arthroplasty 11/27/2011  Procedure: TOTAL KNEE ARTHROPLASTY;  Surgeon: Yvette Rack., MD;  Location: [facility];  Service: Orthopedics;  Laterality: Left;  left total knee arthroplasty    History  Smoking status  . Never Smoker   Smokeless tobacco  . Never Used    History  Alcohol Use  . Yes    Comment: "occassionlly; last beer was 09/2011"    Family History  Problem Relation Age of Onset  . Heart failure Father   . Stroke Father   . Coronary artery disease Mother   . Heart disease Mother   . Hyperlipidemia Sister     Review of Systems: The review of systems is per the HPI.  All other systems were reviewed and are negative.  Physical Exam: BP 122/80  Pulse 73  Ht 5\' 7"  (1.702 m)  Wt 76.749 kg (169 lb 3.2 oz)  BMI 26.50 kg/m2  SpO2 98% Patient is very pleasant and in no acute distress. Skin is warm and dry. Color is normal.  HEENT is unremarkable. Normocephalic/atraumatic. PERRL. Sclera are nonicteric. Neck is supple.  No masses. No JVD. Lungs are clear. Cardiac exam shows a regular rate and rhythm. S1 and S2 are normal. No gallop or murmur. Abdomen is soft. No masses. No hepatosplenomegaly. Extremities are without edema. Gait and ROM are intact. No gross neurologic deficits noted.   LABORATORY DATA:    Assessment / Plan: 1. Ischemic CM with chronic systolic CHF- He is doing well and is asymptomatic at this time. Blood pressure is good. I have left him on his current regimen of carvedilol. He has a history of hypertension on ARB.  2. CAD -status post prior myocardial infarction. Status post stenting of the left circumflex with a DES. Chronic total occlusion of the RCA. Doing well. No symptoms. Continue aspirin and Plavix indefinitely. Continue statin therapy.  3. Hypercholesterolemia. Patient had recent lab work done with his primary care. We will obtain a copy. Continue Crestor.  4. Prophylactic ICD. He is due to have this checked again.

## 2012-07-20 ENCOUNTER — Ambulatory Visit (INDEPENDENT_AMBULATORY_CARE_PROVIDER_SITE_OTHER): Payer: Medicare Other | Admitting: *Deleted

## 2012-07-20 ENCOUNTER — Encounter: Payer: Self-pay | Admitting: Internal Medicine

## 2012-07-20 ENCOUNTER — Telehealth: Payer: Self-pay | Admitting: Cardiology

## 2012-07-20 DIAGNOSIS — I2589 Other forms of chronic ischemic heart disease: Secondary | ICD-10-CM

## 2012-07-20 DIAGNOSIS — I255 Ischemic cardiomyopathy: Secondary | ICD-10-CM

## 2012-07-20 DIAGNOSIS — Z95810 Presence of automatic (implantable) cardiac defibrillator: Secondary | ICD-10-CM

## 2012-07-20 NOTE — Telephone Encounter (Signed)
Spoke w/pt in regards to transmission. Transmission was received and pt aware of this.

## 2012-07-20 NOTE — Telephone Encounter (Signed)
New Problem:    Patient called in because he received a no show letter for his last remote device check and wanted to speak with you about it.  Please call back.

## 2012-07-26 LAB — REMOTE ICD DEVICE
AL AMPLITUDE: 3.4 mv
AL IMPEDENCE ICD: 448 Ohm
ATRIAL PACING ICD: 18.41 pct
BAMS-0001: 170 {beats}/min
BATTERY VOLTAGE: 3.03 V
CHARGE TIME: 10.35 s
DEV-0020ICD: NEGATIVE
FVT: 0
PACEART VT: 0
RV LEAD AMPLITUDE: 5.7 mv
RV LEAD IMPEDENCE ICD: 392 Ohm
TOT-0001: 1
TOT-0002: 0
TOT-0006: 20091103000000
TZAT-0001ATACH: 1
TZAT-0001ATACH: 2
TZAT-0001ATACH: 3
TZAT-0001FASTVT: 1
TZAT-0001SLOWVT: 1
TZAT-0001SLOWVT: 2
TZAT-0002ATACH: NEGATIVE
TZAT-0002ATACH: NEGATIVE
TZAT-0002ATACH: NEGATIVE
TZAT-0002FASTVT: NEGATIVE
TZAT-0004SLOWVT: 8
TZAT-0004SLOWVT: 8
TZAT-0005SLOWVT: 88 pct
TZAT-0005SLOWVT: 91 pct
TZAT-0011SLOWVT: 10 ms
TZAT-0011SLOWVT: 10 ms
TZAT-0012ATACH: 150 ms
TZAT-0012ATACH: 150 ms
TZAT-0012ATACH: 150 ms
TZAT-0012FASTVT: 200 ms
TZAT-0012SLOWVT: 200 ms
TZAT-0012SLOWVT: 200 ms
TZAT-0013SLOWVT: 2
TZAT-0013SLOWVT: 2
TZAT-0018ATACH: NEGATIVE
TZAT-0018ATACH: NEGATIVE
TZAT-0018ATACH: NEGATIVE
TZAT-0018FASTVT: NEGATIVE
TZAT-0018SLOWVT: NEGATIVE
TZAT-0018SLOWVT: NEGATIVE
TZAT-0019ATACH: 6 V
TZAT-0019ATACH: 6 V
TZAT-0019ATACH: 6 V
TZAT-0019FASTVT: 8 V
TZAT-0019SLOWVT: 8 V
TZAT-0019SLOWVT: 8 V
TZAT-0020ATACH: 1.5 ms
TZAT-0020ATACH: 1.5 ms
TZAT-0020ATACH: 1.5 ms
TZAT-0020FASTVT: 1.5 ms
TZAT-0020SLOWVT: 1.5 ms
TZAT-0020SLOWVT: 1.5 ms
TZON-0003ATACH: 350 ms
TZON-0003SLOWVT: 350 ms
TZON-0003VSLOWVT: 350 ms
TZON-0004SLOWVT: 40
TZON-0004VSLOWVT: 44
TZON-0005SLOWVT: 12
TZST-0001ATACH: 4
TZST-0001ATACH: 5
TZST-0001ATACH: 6
TZST-0001FASTVT: 2
TZST-0001FASTVT: 3
TZST-0001FASTVT: 4
TZST-0001FASTVT: 5
TZST-0001FASTVT: 6
TZST-0001SLOWVT: 3
TZST-0001SLOWVT: 4
TZST-0001SLOWVT: 5
TZST-0001SLOWVT: 6
TZST-0002ATACH: NEGATIVE
TZST-0002ATACH: NEGATIVE
TZST-0002ATACH: NEGATIVE
TZST-0002FASTVT: NEGATIVE
TZST-0002FASTVT: NEGATIVE
TZST-0002FASTVT: NEGATIVE
TZST-0002FASTVT: NEGATIVE
TZST-0002FASTVT: NEGATIVE
TZST-0003SLOWVT: 20 J
TZST-0003SLOWVT: 25 J
TZST-0003SLOWVT: 35 J
TZST-0003SLOWVT: 35 J
VENTRICULAR PACING ICD: 0.02 pct
VF: 0

## 2012-08-10 ENCOUNTER — Encounter: Payer: Self-pay | Admitting: *Deleted

## 2012-08-17 ENCOUNTER — Other Ambulatory Visit: Payer: Self-pay | Admitting: Cardiology

## 2012-08-17 DIAGNOSIS — I2510 Atherosclerotic heart disease of native coronary artery without angina pectoris: Secondary | ICD-10-CM

## 2012-08-17 MED ORDER — ROSUVASTATIN CALCIUM 20 MG PO TABS: 20.0000 mg | ORAL_TABLET | Freq: Every day | ORAL | Status: DC

## 2012-08-17 MED ORDER — EZETIMIBE 10 MG PO TABS: 10.0000 mg | ORAL_TABLET | Freq: Every day | ORAL | Status: DC

## 2012-08-17 MED ORDER — CLOPIDOGREL BISULFATE 75 MG PO TABS: 75.0000 mg | ORAL_TABLET | Freq: Every day | ORAL | Status: DC

## 2012-09-08 ENCOUNTER — Telehealth: Payer: Self-pay | Admitting: Internal Medicine

## 2012-09-08 NOTE — Telephone Encounter (Signed)
Spoke to patient's wife she stated patient did not feel good yesterday B/P 136/70 pulse-50.States today B/P 126/63 pulse-47.States he was a little light headed yesterday,but today feels better.States has had no chest pain,no sob.States husband want tell her everything.Advised to continue monitor B/P and continue same medication.Advised to call back if needed.

## 2012-09-08 NOTE — Telephone Encounter (Signed)
Pt's wife calling re pt having a problem with BP, pls call to advise, wants to know if needs device ck or to see Martinique

## 2012-09-08 NOTE — Telephone Encounter (Signed)
F/U   Wife calling back regarding his BP 126/63  Today reading. Hr 47. Some lightheadedness. Wife states no problem with his defib

## 2012-09-13 ENCOUNTER — Telehealth: Payer: Self-pay | Admitting: Cardiology

## 2012-09-13 NOTE — Telephone Encounter (Signed)
Spoke to patient he stated he has a recurrent sinus infection.Stated he had a sinus infection over Christmas and was unable to finish amoxicillin due to diarrhea.States he saw PCP 09/12/12 and he prescribed doxycycline 100 mg twice a day.States today he is dizzy and light headed,B/P 136/90 p-80,126/90 p-52.States he has gained 5 lbs since 08/20/12,but has not been drinking that much.States PCP told him 09/12/12 to increase his fluid intake and he has been urinating more today.Advised to continue to take doxycycline as prescribed.Advised may take a couple of days before he starts feeling better.Advised to weigh in the morning and if weight continues to be up Dr.Jordan may need to prescribe a diuretic.Advised to call back if not better.

## 2012-09-13 NOTE — Telephone Encounter (Signed)
New problem:   C/o sob, retain fluids, slow heart rate.

## 2012-09-26 ENCOUNTER — Telehealth: Payer: Self-pay | Admitting: Cardiology

## 2012-09-26 NOTE — Telephone Encounter (Signed)
Pt has history of stent, on diuretic , lost 9 lbs, pt feels tired and weak. pls advise

## 2012-09-26 NOTE — Telephone Encounter (Signed)
Patient's wife called no answer.Patient's cell phone called patient stated he went to see PCP Dr.Griffin last Wednesday 09/21/12 and he had fluid in abdomen.States Dr.Griffin prescribed Lasix for 3 days with a potassium for 3 days.States his weight went from 175 lbs to today 166 lbs.States he feels a little better,but continues to be weak, no sob.Patient was told Dr.Jordan not in office today will let Dr.Jordan know and will call Dr.Griffin's office 09/27/12 for this office note,cxr,labwork.

## 2012-09-27 ENCOUNTER — Encounter: Payer: Self-pay | Admitting: Cardiology

## 2012-09-27 ENCOUNTER — Ambulatory Visit (INDEPENDENT_AMBULATORY_CARE_PROVIDER_SITE_OTHER): Payer: Medicare Other | Admitting: Cardiology

## 2012-09-27 VITALS — BP 140/81 | HR 48 | Ht 67.0 in | Wt 169.8 lb

## 2012-09-27 DIAGNOSIS — I2510 Atherosclerotic heart disease of native coronary artery without angina pectoris: Secondary | ICD-10-CM

## 2012-09-27 DIAGNOSIS — I2589 Other forms of chronic ischemic heart disease: Secondary | ICD-10-CM

## 2012-09-27 DIAGNOSIS — I5022 Chronic systolic (congestive) heart failure: Secondary | ICD-10-CM

## 2012-09-27 DIAGNOSIS — I10 Essential (primary) hypertension: Secondary | ICD-10-CM

## 2012-09-27 NOTE — Patient Instructions (Signed)
You may take lasix as needed for increased weight or swelling.  Restrict your sodium to 2000 mg daily- read labels.  1.5 Gram Low Sodium Diet A 1.5 gram sodium diet restricts the amount of sodium in the diet to no more than 1.5 g or 1500 mg daily. The American Heart Association recommends Americans over the age of 89 to consume no more than 1500 mg of sodium each day to reduce the risk of developing high blood pressure. Research also shows that limiting sodium may reduce heart attack and stroke risk. Many foods contain sodium for flavor and sometimes as a preservative. When the amount of sodium in a diet needs to be low, it is important to know what to look for when choosing foods and drinks. The following includes some information and guidelines to help make it easier for you to adapt to a low sodium diet. QUICK TIPS  Do not add salt to food.  Avoid convenience items and fast food.  Choose unsalted snack foods.  Buy lower sodium products, often labeled as "lower sodium" or "no salt added."  Check food labels to learn how much sodium is in 1 serving.  When eating at a restaurant, ask that your food be prepared with less salt or none, if possible. READING FOOD LABELS FOR SODIUM INFORMATION The nutrition facts label is a good place to find how much sodium is in foods. Look for products with no more than 400 mg of sodium per serving. Remember that 1.5 g = 1500 mg. The food label may also list foods as:  Sodium-free: Less than 5 mg in a serving.  Very low sodium: 35 mg or less in a serving.  Low-sodium: 140 mg or less in a serving.  Light in sodium: 50% less sodium in a serving. For example, if a food that usually has 300 mg of sodium is changed to become light in sodium, it will have 150 mg of sodium.  Reduced sodium: 25% less sodium in a serving. For example, if a food that usually has 400 mg of sodium is changed to reduced sodium, it will have 300 mg of sodium. CHOOSING  FOODS Grains  Avoid: Salted crackers and snack items. Some cereals, including instant hot cereals. Bread stuffing and biscuit mixes. Seasoned rice or pasta mixes.  Choose: Unsalted snack items. Low-sodium cereals, oats, puffed wheat and rice, shredded wheat. English muffins and bread. Pasta. Meats  Avoid: Salted, canned, smoked, spiced, pickled meats, including fish and poultry. Bacon, ham, sausage, cold cuts, hot dogs, anchovies.  Choose: Low-sodium canned tuna and salmon. Fresh or frozen meat, poultry, and fish. Dairy  Avoid: Processed cheese and spreads. Cottage cheese. Buttermilk and condensed milk. Regular cheese.  Choose: Milk. Low-sodium cottage cheese. Yogurt. Sour cream. Low-sodium cheese. Fruits and Vegetables  Avoid: Regular canned vegetables. Regular canned tomato sauce and paste. Frozen vegetables in sauces. Olives. Johnny Oneill. Relishes. Sauerkraut.  Choose: Low-sodium canned vegetables. Low-sodium tomato sauce and paste. Frozen or fresh vegetables. Fresh and frozen fruit. Condiments  Avoid: Canned and packaged gravies. Worcestershire sauce. Tartar sauce. Barbecue sauce. Soy sauce. Steak sauce. Ketchup. Onion, garlic, and table salt. Meat flavorings and tenderizers.  Choose: Fresh and dried herbs and spices. Low-sodium varieties of mustard and ketchup. Lemon juice. Tabasco sauce. Horseradish. SAMPLE 1.5 GRAM SODIUM MEAL PLAN Breakfast / Sodium (mg)  1 cup low-fat milk / 143 mg  1 whole-wheat English muffin / 240 mg  1 tbs heart-healthy margarine / 153 mg  1 hard-boiled egg / 139  mg  1 small orange / 0 mg Lunch / Sodium (mg)  1 cup raw carrots / 76 mg  2 tbs no salt added peanut butter / 5 mg  2 slices whole-wheat bread / 270 mg  1 tbs jelly / 6 mg   cup red grapes / 2 mg Dinner / Sodium (mg)  1 cup whole-wheat pasta / 2 mg  1 cup low-sodium tomato sauce / 73 mg  3 oz lean ground beef / 57 mg  1 small side salad (1 cup raw spinach leaves,  cup  cucumber,  cup yellow bell pepper) with 1 tsp olive oil and 1 tsp red wine vinegar / 25 mg Snack / Sodium (mg)  1 container low-fat vanilla yogurt / 107 mg  3 graham cracker squares / 127 mg Nutrient Analysis  Calories: 1745  Protein: 75 g  Carbohydrate: 237 g  Fat: 57 g  Sodium: 1425 mg Document Released: 08/17/2005 Document Revised: 11/09/2011 Document Reviewed: 11/18/2009 [facility] Patient Information 2013 [place], [place].

## 2012-09-27 NOTE — Telephone Encounter (Signed)
Spoke to patient's wife she stated she is worried about husband.States he acts like he did when he had his heart attack,no energy,color bad.Appointment scheduled with Dr.Jordan today 09/27/12 at 4:15 pm.Records from 09/26/12 office visit with PCP Dr.Griffin requested.

## 2012-09-27 NOTE — Progress Notes (Signed)
Johnny Oneill Date of Birth: December 22, 1940 Medical Record [id number]  History of Present Illness: Mr. Heitner is seen back today for evaluation of congestive heart failure. He has known ischemic heart disease with prior stenting of the LCX in 2004, suffered occlusion of the LCX with a lateral MI in 2009 and had repeat stenting. EF is down to 35%. He has an ICD in place. Recently he had an upper respiratory infection with cough and congestion. He was treated with doxycycline. However he noted that he had a persistent dry cough and then developed orthopnea. His weight increased 275 pounds. He had increased abdominal girth. He became more short of breath with exertion and felt lightheaded all the time. He was seen by his primary care who placed him on Lasix for 3 days. He diuresed 9 pounds. His abdominal swelling resolved. His breathing is much better now. In reviewing his diet he has been eating a fairly high sodium diet including eating barbecue, hot dogs, and processed meat.  Current Outpatient Prescriptions on File Prior to Visit  Medication Sig Dispense Refill  . aspirin 325 MG tablet Take 325 mg by mouth daily.        . carvedilol (COREG) 6.25 MG tablet Take one and a half tablets (9.875 mg) two times a day  270 tablet  2  . clopidogrel (PLAVIX) 75 MG tablet Take 1 tablet (75 mg total) by mouth daily.  90 tablet  3  . Coenzyme Q10 (CO Q-10 PO) Take 1 capsule by mouth daily.      [name] ezetimibe (ZETIA) 10 MG tablet Take 1 tablet (10 mg total) by mouth daily.  90 tablet  3  . levothyroxine (SYNTHROID, LEVOTHROID) 150 MCG tablet Take 150 mcg by mouth daily.      . nitroGLYCERIN (NITROSTAT) 0.4 MG SL tablet Place 0.4 mg under the tongue every 5 (five) minutes as needed. For chest pain      . oxyCODONE-acetaminophen (PERCOCET) 5-325 MG per tablet Take 0.5-1 tablets by mouth 2 (two) times daily as needed. For pain.      . rosuvastatin (CRESTOR) 20 MG tablet Take 1 tablet (20 mg total) by mouth daily.  90  tablet  3  . ZOSTAVAX 16109 UNT/0.65ML injection         Allergies  Allergen Reactions  . Diovan (Valsartan) Other (See Comments)    Hypotension at low dose, hyperkalemia  . Lipitor (Atorvastatin Calcium) Other (See Comments)    Muscle pain    Past Medical History  Diagnosis Date  . Ischemic cardiomyopathy     a. 10/09 Echo: Sev LV Dysfxn, inf/lat AK, Mod MR.  [name];  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 Hypertension   . PVC (premature ventricular contraction)   . Pacemaker     Past Surgical History  Procedure Date  . Defibrillator 2009  . Total knee arthroplasty 11/27/11    left  . Insert / replace / remove pacemaker 2009  . Coronary angioplasty with stent placement 2004  .  Coronary angioplasty with stent placement 07/2008  . Knee arthroscopy w/ partial medial meniscectomy 12/2002    left  . Knee arthroscopy     bilaterally  . Total knee arthroplasty 11/27/2011    Procedure: TOTAL KNEE ARTHROPLASTY;  Surgeon: Yvette Rack., MD;  Location: [facility];  Service: Orthopedics;  Laterality: Left;  left total knee arthroplasty    History  Smoking status  . Never Smoker   Smokeless tobacco  . Never Used    History  Alcohol Use  . Yes    Comment: "occassionlly; last beer was 09/2011"    Family History  Problem Relation Age of Onset  . Heart failure Father   . Stroke Father   . Coronary artery disease Mother   . Heart disease Mother   . Hyperlipidemia Sister     Review of Systems: The review of systems is per the HPI.  All other systems were reviewed and are negative.  Physical Exam: BP 140/81  Pulse 48  Ht 5\' 7"  (1.702  m)  Wt 169 lb 12.8 oz (77.021 kg)  BMI 26.59 kg/m2 Patient is very pleasant and in no acute distress. Skin is warm and dry. Color is normal.  HEENT is unremarkable. Normocephalic/atraumatic. PERRL. Sclera are nonicteric. Neck is supple. No masses. No JVD. Lungs are clear. Cardiac exam shows a regular rate and rhythm. S1 and S2 are normal. No gallop or murmur. Abdomen is soft. No masses. No hepatosplenomegaly.  No ascites.Extremities are without edema. Gait and ROM are intact. No gross neurologic deficits noted.   LABORATORY DATA:    Assessment / Plan: 1. Ischemic CM with acute on chronic systolic CHF- recent exacerbation related to increased sodium intake and URI. Clinically improved with diuresis. I reviewed the importance of sodium restriction. I've given him dietary information. He is going to weigh himself daily. If his weight increases or he has increased swelling he has Lasix to take as needed. We will update his echocardiogram. I'll followup in 3 months.  2. CAD -status post prior myocardial infarction. Status post stenting of the left circumflex with a DES. Chronic total occlusion of the RCA. Doing well. No symptoms. Continue aspirin and Plavix indefinitely. Continue statin therapy.  3. Hypercholesterolemia.  Continue Crestor.  4. Prophylactic ICD.

## 2012-09-27 NOTE — Telephone Encounter (Signed)
New Problem      Wife's pt has some concerns and would like to speak to nurse

## 2012-09-28 ENCOUNTER — Telehealth: Payer: Self-pay

## 2012-09-28 DIAGNOSIS — I5022 Chronic systolic (congestive) heart failure: Secondary | ICD-10-CM

## 2012-09-28 NOTE — Telephone Encounter (Signed)
Spoke to wife was told Dr.Jordan advised patient needs a echo.Schedulers will be calling to schedule.

## 2012-10-05 ENCOUNTER — Ambulatory Visit (HOSPITAL_COMMUNITY): Payer: Medicare Other | Attending: Cardiology | Admitting: Radiology

## 2012-10-05 DIAGNOSIS — I4510 Unspecified right bundle-branch block: Secondary | ICD-10-CM | POA: Insufficient documentation

## 2012-10-05 DIAGNOSIS — I252 Old myocardial infarction: Secondary | ICD-10-CM | POA: Insufficient documentation

## 2012-10-05 DIAGNOSIS — E785 Hyperlipidemia, unspecified: Secondary | ICD-10-CM | POA: Insufficient documentation

## 2012-10-05 DIAGNOSIS — I059 Rheumatic mitral valve disease, unspecified: Secondary | ICD-10-CM | POA: Insufficient documentation

## 2012-10-05 DIAGNOSIS — I2510 Atherosclerotic heart disease of native coronary artery without angina pectoris: Secondary | ICD-10-CM | POA: Insufficient documentation

## 2012-10-05 DIAGNOSIS — I10 Essential (primary) hypertension: Secondary | ICD-10-CM | POA: Insufficient documentation

## 2012-10-05 DIAGNOSIS — I5022 Chronic systolic (congestive) heart failure: Secondary | ICD-10-CM

## 2012-10-05 DIAGNOSIS — I509 Heart failure, unspecified: Secondary | ICD-10-CM | POA: Insufficient documentation

## 2012-10-05 NOTE — Progress Notes (Signed)
Echocardiogram performed.  

## 2012-10-24 ENCOUNTER — Telehealth: Payer: Self-pay | Admitting: Cardiology

## 2012-10-24 NOTE — Telephone Encounter (Signed)
Pt is having SOB and they want to do more testing to find out what is going on

## 2012-10-24 NOTE — Telephone Encounter (Signed)
Johnny Oneill aware and recommended to keep his low sodium diet. Pt is to take Lasix po as needed. Pt has an appointment  with Johnny Merle NP on 10/31/12 at 2:30 PM pt and wife aware.

## 2012-10-24 NOTE — Telephone Encounter (Signed)
Pt's wife called because pt has been SOB, pale skin, feels blotted since Saturday. Pt had an episode like this before so his PCP prescribed Lasix and it help. Pt took one Lasix this AM , wife is worried that could be other than his CHF that is making him so SOB, like heart blockage that is given him these symptoms. Pt's weight last night was a 167 lbs  last night.

## 2012-10-31 ENCOUNTER — Encounter: Payer: Self-pay | Admitting: Nurse Practitioner

## 2012-10-31 ENCOUNTER — Ambulatory Visit (INDEPENDENT_AMBULATORY_CARE_PROVIDER_SITE_OTHER): Payer: Medicare Other | Admitting: Nurse Practitioner

## 2012-10-31 VITALS — BP 118/72 | HR 51 | Ht 67.0 in | Wt 164.0 lb

## 2012-10-31 DIAGNOSIS — I2589 Other forms of chronic ischemic heart disease: Secondary | ICD-10-CM

## 2012-10-31 DIAGNOSIS — I255 Ischemic cardiomyopathy: Secondary | ICD-10-CM

## 2012-10-31 NOTE — Progress Notes (Signed)
Johnny Oneill Date of Birth: 1940/10/23 Medical Record [id number]  History of Present Illness: Johnny Oneill is seen back today for a work in visit. He is seen for Dr. Martinique. He has CHF, known CAD with prior stenting of the LCX in 2004, suffered occlusion of the LCX with a lateral MI in 2009 and repeat stenting with Promus. Has total occlusion of the RCA noted. EF is down to 35%. Has an ICD in place. Last seen here in January. There was considerable salt use reported at that visit. His other issues include hypothyroidism, HLD and OA. Just had his echo updated in early February. EF is basically unchanged.   He called last week with shortness of breath, pale skin and bloated. Took a dose of Lasix. Remains short of breath.   He comes in today. He is here with his wife. He is quite frustrated. He has had an apparent URI since December. While this has improved, he remains very short of breath. It is with activity. He notes that he is short of breath with just walking to his car and with bending over. He says his chest feels like it is burning at times. Hard to say if this is actual angina or not. He is dizzy and weak when he takes Lasix. BP has limited our ability to titrate his medicines. He has had hypotension and hyperkalemia with ARB therapy in the past. It sounds like he is limiting his salt. He admits that he is very frustrated and then gets irritated with his family. No ICD shocks.   Current Outpatient Prescriptions on File Prior to Visit  Medication Sig Dispense Refill  . aspirin 325 MG tablet Take 325 mg by mouth daily.        . carvedilol (COREG) 6.25 MG tablet Take one and a half tablets (9.875 mg) two times a day  270 tablet  2  . clopidogrel (PLAVIX) 75 MG tablet Take 1 tablet (75 mg total) by mouth daily.  90 tablet  3  . Coenzyme Q10 (CO Q-10 PO) Take 1 capsule by mouth daily.      [name] ezetimibe (ZETIA) 10 MG tablet Take 1 tablet (10 mg total) by mouth daily.  90 tablet  3  . levothyroxine  (SYNTHROID, LEVOTHROID) 150 MCG tablet Take 150 mcg by mouth daily.      . nitroGLYCERIN (NITROSTAT) 0.4 MG SL tablet Place 0.4 mg under the tongue every 5 (five) minutes as needed. For chest pain      . oxyCODONE-acetaminophen (PERCOCET) 5-325 MG per tablet Take 0.5-1 tablets by mouth 2 (two) times daily as needed. For pain.      . rosuvastatin (CRESTOR) 20 MG tablet Take 1 tablet (20 mg total) by mouth daily.  90 tablet  3  . ZOSTAVAX 16109 UNT/0.65ML injection        No current facility-administered medications on file prior to visit.    Allergies  Allergen Reactions  . Diovan (Valsartan) Other (See Comments)    Hypotension at low dose, hyperkalemia  . Lipitor (Atorvastatin Calcium) Other (See Comments)    Muscle pain    Past Medical History  Diagnosis Date  . Ischemic cardiomyopathy     a. 10/09 Echo: Sev LV Dysfxn, inf/lat AK, Mod MR.  [name];  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 Hypertension   . PVC (premature ventricular contraction)   . Pacemaker     Past Surgical History  Procedure Laterality Date  . Defibrillator  2009  . Total knee arthroplasty  11/27/11    left  . Insert / replace / remove pacemaker  2009  . Coronary angioplasty with stent placement  2004  . Coronary angioplasty with stent placement  07/2008  . Knee arthroscopy w/ partial medial meniscectomy  12/2002    left  . Knee arthroscopy      bilaterally  . Total knee arthroplasty  11/27/2011    Procedure: TOTAL KNEE ARTHROPLASTY;  Surgeon: Yvette Rack., MD;  Location: [facility];  Service: Orthopedics;  Laterality: Left;  left total knee arthroplasty    History    Smoking status  . Never Smoker   Smokeless tobacco  . Never Used    History  Alcohol Use  . Yes    Comment: "occassionlly; last beer was 09/2011"    Family History  Problem Relation Age of Onset  . Heart failure Father   . Stroke Father   . Coronary artery disease Mother   . Heart disease Mother   . Hyperlipidemia Sister     Review of Systems: The review of systems is per the HPI.  All other systems were reviewed and are negative.  Physical Exam: BP 118/72  Pulse 51  Ht 5\' 7"  (1.702 m)  Wt 164 lb (74.39 kg)  BMI 25.68 kg/m2  SpO2 98% Patient is alert and in no acute distress. Skin is warm and dry. Color is normal.  HEENT is unremarkable. Normocephalic/atraumatic. PERRL. Sclera are nonicteric. Neck is supple. No masses. No JVD. Lungs are clear. Cardiac exam shows a regular rate and rhythm. He has some occasional ectopics.  Abdomen is soft. Extremities are without edema. Gait and ROM are intact. No gross neurologic deficits noted.  LABORATORY DATA:   Chemistry      Component Value Date/Time   NA 141 12/30/2011 1617   K 4.1 12/30/2011 1617   CL 105 12/30/2011 1617   CO2 29 12/30/2011 1617   BUN 21 12/30/2011 1617   BUN 21 05/19/2010   CREATININE 1.2 12/30/2011 1617      Component Value Date/Time   CALCIUM 9.2 12/30/2011 1617   ALKPHOS 83 11/19/2011 1258   AST 20 11/19/2011 1258   ALT 23 11/19/2011 1258   BILITOT 0.5 11/19/2011 1258     Lab Results  Component Value Date   WBC 6.1 12/30/2011   HGB 13.8 12/30/2011   HCT 41.9 12/30/2011   MCV 91.5 12/30/2011   PLT 163.0 12/30/2011    Echo Study Conclusions from February 2014  - Left ventricle: The cavity size was mildly dilated. Wall thickness was increased in a pattern of mild LVH. Systolic function was severely reduced. The estimated ejection fraction was in the range of 25% to 30%. Diffuse hypokinesis. There is akinesis of the posterolateral and inferior myocardium. - Mitral valve: Mild to moderate regurgitation. - Left atrium:  The atrium was mildly dilated. - Pulmonary arteries: Systolic pressure was moderately increased. PA peak pressure: 28mm Hg (S).   Assessment / Plan: 1.  Ischemic CM -  EF is 25 to 30% - I have tried to explain how heart failure is a balance between shortness of breath and weakness/dizziness. How we have no cure and that it is managed and unfortunately comes with progression of symptoms. For now I have left him on his current regimen. His weight is down 4 pounds since he was last here. We will update his Myoview and make sure he is not ischemic. Dr. Martinique and I will see him back. If he fails to improve we will need to consider referral to the [facility].   2. Recent URI - this sounds like this has improved. Would hold off on referral to pulmonary at this time.  3. Prophylactic ICD - no shocks reported.   Dr. Martinique and I will see him back in about 10 days. Checking labs today. Further disposition to follow.   Patient is agreeable to this plan and will call if any problems develop in the interim.

## 2012-10-31 NOTE — Patient Instructions (Signed)
We are checking labs today.  Go to [facility] for your lab work  We are going to update your stress test  We will see you back in about 10 days for further discussion - we may end up sending you to the [facility]  For now, stay on your current medicines  Call the [place] office at [phone] if you have any questions, problems or concerns.

## 2012-11-01 ENCOUNTER — Other Ambulatory Visit: Payer: Medicare Other

## 2012-11-02 ENCOUNTER — Encounter: Payer: Self-pay | Admitting: Nurse Practitioner

## 2012-11-07 ENCOUNTER — Telehealth: Payer: Self-pay | Admitting: *Deleted

## 2012-11-07 NOTE — Telephone Encounter (Signed)
Called [facility] to f/u with BNP will fax over to office today 11/07/12

## 2012-11-07 NOTE — Telephone Encounter (Signed)
S/w pts wife pt bnp was up so he is to start 40 mg Lasix daily, today, pts wife vebally understood, pts myoview is moved to 11/08/12 and went over instructions for the myoview pts wife repeated instructions back to me  He will be seen by Truitt Merle, NP after the [facility] on 11/08/12

## 2012-11-08 ENCOUNTER — Encounter: Payer: Self-pay | Admitting: Nurse Practitioner

## 2012-11-08 ENCOUNTER — Ambulatory Visit (INDEPENDENT_AMBULATORY_CARE_PROVIDER_SITE_OTHER): Payer: Medicare Other | Admitting: Nurse Practitioner

## 2012-11-08 ENCOUNTER — Ambulatory Visit (HOSPITAL_COMMUNITY): Payer: Medicare Other | Attending: Cardiology | Admitting: Radiology

## 2012-11-08 VITALS — BP 132/80 | HR 64 | Ht 67.0 in | Wt 161.2 lb

## 2012-11-08 VITALS — BP 137/83 | HR 88 | Ht 67.0 in | Wt 158.0 lb

## 2012-11-08 DIAGNOSIS — R079 Chest pain, unspecified: Secondary | ICD-10-CM

## 2012-11-08 DIAGNOSIS — I255 Ischemic cardiomyopathy: Secondary | ICD-10-CM

## 2012-11-08 DIAGNOSIS — I5022 Chronic systolic (congestive) heart failure: Secondary | ICD-10-CM

## 2012-11-08 DIAGNOSIS — I2589 Other forms of chronic ischemic heart disease: Secondary | ICD-10-CM | POA: Insufficient documentation

## 2012-11-08 DIAGNOSIS — I2510 Atherosclerotic heart disease of native coronary artery without angina pectoris: Secondary | ICD-10-CM

## 2012-11-08 MED ORDER — REGADENOSON 0.4 MG/5ML IV SOLN
0.4000 mg | Freq: Once | INTRAVENOUS | Status: AC
  Administered 2012-11-08: 0.4 mg via INTRAVENOUS

## 2012-11-08 MED ORDER — TECHNETIUM TC 99M SESTAMIBI GENERIC - CARDIOLITE
10.8000 | Freq: Once | INTRAVENOUS | Status: AC | PRN
  Administered 2012-11-08: 11 via INTRAVENOUS

## 2012-11-08 MED ORDER — TECHNETIUM TC 99M SESTAMIBI GENERIC - CARDIOLITE
33.0000 | Freq: Once | INTRAVENOUS | Status: AC | PRN
  Administered 2012-11-08: 33 via INTRAVENOUS

## 2012-11-08 NOTE — Patient Instructions (Addendum)
We will arrange for a cardiopulmonary stress test  I will get you referred to the [facility] to see Dr. Haroldine Laws

## 2012-11-08 NOTE — Progress Notes (Signed)
[facility] [place], [place] [address] [phone]    Cardiology Nuclear Med Study  Johnny Oneill is a 72 y.o. male     MRN : [id number]     DOB: [birth year]  Procedure Date: 11/08/2012  Nuclear Med Background Indication for Stress Test:  Evaluation for Ischemia and Stent Patency History:  '09 PTCA/Stent-restenosed LCX; '09 AICD'12 [id number] fixed infero-lateral defect with minimal peri-infarct ischemia, EF=28%; 2/14 Echo:EF=30% Cardiac Risk Factors: Family History - CAD, Hypertension, Lipids and RBBB  Symptoms:  Chest Pain/ "Burning" with Deep Breath (URI since 12/13), Diaphoresis, Dizziness, DOE, Fatigue, Palpitations and SOB   Nuclear Pre-Procedure Caffeine/Decaff Intake:  None NPO After: 6:30am   Lungs:  Clear. O2 Sat: 98% on room air. IV 0.9% NS with Angio Cath:  20g  IV Site: R Hand  IV Started by:  Perrin Maltese, EMT-P  Chest Size (in):  39 Cup Size: n/a  Height: 5\' 7"  (1.702 m)  Weight:  158 lb (71.668 kg)  BMI:  Body mass index is 24.74 kg/(m^2). Tech Comments:  Rx this am    Nuclear Med Study 1 or 2 day study: 1 day  Stress Test Type:  Lexiscan  Reading MD: Dola Argyle, MD  Order Authorizing Provider:  Peter Martinique, MD  Resting Radionuclide: Technetium 44m Sestamibi  Resting Radionuclide Dose: 10.8 mCi   Stress Radionuclide:  Technetium 70m Sestamibi  Stress Radionuclide Dose: 33.0 mCi           Stress Protocol Rest HR: 88 Stress HR: 95  Rest BP: 137/83 Stress BP: 117/81  Exercise Time (min): n/a METS: n/a   Predicted Max HR: 151 bpm % Max HR: 62.91 bpm Rate Pressure Product: 11115   Dose of Adenosine (mg):  n/a Dose of Lexiscan: 0.4 mg  Dose of Atropine (mg): n/a Dose of Dobutamine: n/a mcg/kg/min (at max HR)  Stress Test Technologist: Letta Moynahan, CMA-N  Nuclear Technologist:  Charlton Amor, CNMT     Rest Procedure:  Myocardial perfusion imaging was performed at rest 45 minutes following the  intravenous administration of Technetium 23m Sestamibi.  Rest ECG: Normal sinus rhythm with interventricular conduction delay and PVCs  Stress Procedure:  The patient received IV Lexiscan 0.4 mg over 15-seconds.  Technetium 20m Sestamibi injected at 30-seconds.  Quantitative spect images were obtained after a 45 minute delay.  Stress ECG: No significant change from baseline ECG  QPS Raw Data Images:  Patient motion noted; appropriate software correction applied. Stress Images:  There is a large area of severe decreased photon activity affecting the following areas: the entire inferior wall, the entire inferolateral wall, base/mid anterolateral wall, base/mid inferoseptal wall. Rest Images:  The rest image appears the same as the stress image. Subtraction (SDS):  There is very slight reversibility. Transient Ischemic Dilatation (Normal <1.22):  1.04 Lung/Heart Ratio (Normal <0.45):  0.45  Quantitative Gated Spect Images QGS EDV:  NA QGS ESV:  NA  Impression Exercise Capacity:  Lexiscan with no exercise.  BP Response:  Normal blood pressure response. Clinical Symptoms:  No symptoms. ECG Impression:  No significant ST segment change suggestive of ischemia. Comparison with Prior Nuclear Study: No images to compare  Overall Impression:  There is evidence of a large inferior and inferolateral scar. There is trivial peri-infarct ischemia. Wall motion cannot be assessed because this study is not gated. Historically the ejection fraction was significantly reduced. Overall this study shows scar but no ischemia. This is a high risk  scan because of the large scar and the prior history of left ventricular dysfunction.  LV Ejection Fraction: Not Gated.  LV Wall Motion:  Not gated, There were many PVCs.  Dola Argyle, MD

## 2012-11-08 NOTE — Progress Notes (Signed)
Johnny Oneill Date of Birth: 1940-10-22 Medical Record [id number]  History of Present Illness: [name] is seen back today for a follow up visit. It is an early visit. He is seen for Dr. Martinique. He has known CAD with prior stenting of the LCX in 2004, suffered occlusion of the LCX with a lateral MI in 2009 and repeat stenting with a Promus. Has total occlusion of the RCA noted. EF is down to 35%. Has an ICD in place. Other issues include hypothyroidism, HLD and OA. Had his echo updated in early February 2014 and EF was basically unchanged. He has not tolerated ARB due to hypotension and hyperkalemia. Was previously on higher doses of Coreg but had to cut back due to dizziness and orthostasis.   I saw him earlier this month. He was not doing well. Had had a bad case of sinusitis back in December that was slow to resolve (and still has not totally resolved). Required 3 rounds of antibiotics. Treated by Dr. Laurann Oneill and this included a CXR. Was short of breath. Having chest burning. Dizzy and weak with the Lasix. Has had hypotension and hyperkalemia with ARB therapy in the past. No shocks reported. VERY frustrated and irritated. We referred him for repeat stress testing and repeated his labs (which had to be sent out and are NOT in EPIC). His labs did show an elevated BNP - Lasix was restarted. We moved up his stress test and unfortunately, he had to cancel his ENT appointment due to our stress test.   He comes back today. He is here with his wife. He notes that his weight goes up, he feels bloated, he takes the Lasix and he gets better. He has shortness of breath with mild to moderate activities. His swelling is always in his belly. No peripheral edema. He has had trouble with restricting his salt. Lost 6 pounds over the weekend after eating out at restaurants. He remains very frustrated. 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 He is mainly limited by his  dyspnea. Still with some sinus drainage but sounds like that is improving. Never smoked.   Current Outpatient Prescriptions on File Prior to Visit  Medication Sig Dispense Refill  . aspirin 325 MG tablet Take 325 mg by mouth daily.        . carvedilol (COREG) 6.25 MG tablet Take one and a half tablets (9.875 mg) two times a day  270 tablet  2  . clopidogrel (PLAVIX) 75 MG tablet Take 1 tablet (75 mg total) by mouth daily.  90 tablet  3  . Coenzyme Q10 (CO Q-10 PO) Take 1 capsule by mouth daily.      [name] ezetimibe (ZETIA) 10 MG tablet Take 1 tablet (10 mg total) by mouth daily.  90 tablet  3  . levothyroxine (SYNTHROID, LEVOTHROID) 150 MCG tablet Take 150 mcg by mouth daily.      . nitroGLYCERIN (NITROSTAT) 0.4 MG SL tablet Place 0.4 mg under the tongue every 5 (five) minutes as needed. For chest pain      . oxyCODONE-acetaminophen (PERCOCET) 5-325 MG per tablet Take 0.5-1 tablets by mouth 2 (two) times daily as needed. For pain.      . rosuvastatin (CRESTOR) 20 MG tablet Take 1 tablet (20 mg total) by mouth daily.  90 tablet  3   No current facility-administered medications on file prior to visit.    Allergies  Allergen Reactions  .  Diovan (Valsartan) Other (See Comments)    Hypotension at low dose, hyperkalemia  . Lipitor (Atorvastatin Calcium) Other (See Comments)    Muscle pain    Past Medical History  Diagnosis Date  . Ischemic cardiomyopathy     a. 10/09 Echo: Sev LV Dysfxn, inf/lat AK, Mod MR.  [name];  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 Hypertension   . PVC (premature ventricular contraction)   . Pacemaker     Past Surgical  History  Procedure Laterality Date  . Defibrillator  2009  . Total knee arthroplasty  11/27/11    left  . Insert / replace / remove pacemaker  2009  . Coronary angioplasty with stent placement  2004  . Coronary angioplasty with stent placement  07/2008  . Knee arthroscopy w/ partial medial meniscectomy  12/2002    left  . Knee arthroscopy      bilaterally  . Total knee arthroplasty  11/27/2011    Procedure: TOTAL KNEE ARTHROPLASTY;  Surgeon: Johnny Oneill., MD;  Location: [facility];  Service: Orthopedics;  Laterality: Left;  left total knee arthroplasty    History  Smoking status  . Never Smoker   Smokeless tobacco  . Never Used    History  Alcohol Use  . Yes    Comment: "occassionlly; last beer was 09/2011"    Family History  Problem Relation Age of Onset  . Heart failure Father   . Stroke Father   . Coronary artery disease Mother   . Heart disease Mother   . Hyperlipidemia Sister     Review of Systems: The review of systems is per the HPI.  All other systems were reviewed and are negative.  Physical Exam: BP 132/80  Pulse 64  Ht 5\' 7"  (1.702 m)  Wt 161 lb 3.2 oz (73.12 kg)  BMI 25.24 kg/m2 Patient is very pleasant and in no acute distress. Skin is warm and dry. Color is normal.  HEENT is unremarkable. Normocephalic/atraumatic. PERRL. Sclera are nonicteric. Neck is supple. No masses. No JVD. Lungs are clear. Cardiac exam shows a regular rate and rhythm. Abdomen is soft. Extremities are without edema. Gait and ROM are intact. No gross neurologic deficits noted.  LABORATORY DATA:   From 11/02/12  BUN 29, Creatinine 1.4, Potassium 5.2 BNP 863.7  Preliminary reading from Dr. Ron Oneill on his Myoview looks unchanged from study of 2012 - large fixed inferior/lateral scar - Study not gated.   Assessment / Plan: Systolic heart failure - Class III - not able to take ARB. Has had orthostasis with higher doses of Coreg. Could consider Nitrates/Hydralazine. He is quite frustrated. I  have offered referral to the heart failure clinic and CPX testing to further stratify. He is unrealistically looking for a total cure of his symptoms and I have tried to explain that this is something we have to manage - that it is a chronic disease. I don't think he needs to be referring basketball at this time. Will send him to see Dr.  Bensimhon for his thoughts. May still need to send to pulmonary but this sounds like heart failure to me. Further disposition to follow.   Patient is agreeable to this plan and will call if any problems develop in the interim.

## 2012-11-14 ENCOUNTER — Ambulatory Visit: Payer: Medicare Other | Admitting: Nurse Practitioner

## 2012-11-14 ENCOUNTER — Ambulatory Visit (HOSPITAL_COMMUNITY): Payer: Medicare Other | Attending: Cardiology

## 2012-11-14 DIAGNOSIS — I5022 Chronic systolic (congestive) heart failure: Secondary | ICD-10-CM | POA: Insufficient documentation

## 2012-11-17 ENCOUNTER — Encounter (HOSPITAL_COMMUNITY): Payer: Medicare Other

## 2012-11-24 ENCOUNTER — Ambulatory Visit (HOSPITAL_COMMUNITY)
Admission: RE | Admit: 2012-11-24 | Discharge: 2012-11-24 | Disposition: A | Discharge status: Home / Self Care | Payer: Medicare Other | Source: Ambulatory Visit | Attending: Internal Medicine | Admitting: Internal Medicine

## 2012-11-24 ENCOUNTER — Encounter (HOSPITAL_COMMUNITY): Payer: Self-pay

## 2012-11-24 VITALS — BP 110/68 | HR 58 | Wt 164.0 lb

## 2012-11-24 DIAGNOSIS — I5022 Chronic systolic (congestive) heart failure: Secondary | ICD-10-CM | POA: Insufficient documentation

## 2012-11-24 DIAGNOSIS — R5381 Other malaise: Secondary | ICD-10-CM | POA: Insufficient documentation

## 2012-11-24 DIAGNOSIS — R5383 Other fatigue: Secondary | ICD-10-CM | POA: Insufficient documentation

## 2012-11-24 LAB — PROTIME-INR
INR: 1.21 (ref 0.00–1.49)
Prothrombin Time: 15.1 seconds (ref 11.6–15.2)

## 2012-11-24 LAB — BASIC METABOLIC PANEL
BUN: 37 mg/dL — ABNORMAL HIGH (ref 6–23)
CO2: 26 mEq/L (ref 19–32)
Calcium: 9.5 mg/dL (ref 8.4–10.5)
Chloride: 104 mEq/L (ref 96–112)
Creatinine, Ser: 1.65 mg/dL — ABNORMAL HIGH (ref 0.50–1.35)
GFR calc Af Amer: 47 mL/min — ABNORMAL LOW (ref >90)
GFR calc non Af Amer: 41 mL/min — ABNORMAL LOW (ref >90)
Glucose, Bld: 100 mg/dL — ABNORMAL HIGH (ref 70–99)
Potassium: 4.5 mEq/L (ref 3.5–5.1)
Sodium: 142 mEq/L (ref 135–145)

## 2012-11-24 LAB — CBC
HCT: 43.5 % (ref 39.0–52.0)
Hemoglobin: 14.3 g/dL (ref 13.0–17.0)
MCH: 28.4 pg (ref 26.0–34.0)
MCHC: 32.9 g/dL (ref 30.0–36.0)
MCV: 86.3 fL (ref 78.0–100.0)
Platelets: 178 10*3/uL (ref 150–400)
RBC: 5.04 MIL/uL (ref 4.22–5.81)
RDW: 16.9 % — ABNORMAL HIGH (ref 11.5–15.5)
WBC: 8.3 10*3/uL (ref 4.0–10.5)

## 2012-11-24 MED ORDER — ISOSORBIDE MONONITRATE ER 30 MG PO TB24: 15.0000 mg | ORAL_TABLET | Freq: Every day | ORAL | Status: DC

## 2012-11-24 MED ORDER — HYDRALAZINE HCL 25 MG PO TABS: 12.5000 mg | ORAL_TABLET | Freq: Three times a day (TID) | ORAL | Status: DC

## 2012-11-24 NOTE — Assessment & Plan Note (Addendum)
I reviewed his chart and previous studies in detail. We discussed CPX results at length. He has NYHA IIIB symptoms with mild volume overload despite excellent compliance to his medical regimen. He has been intolerant of ACE/ARB due to hyperkalemia and hypotension. CPX test shows borderline pVO2 with markedly elevated VeVCO2 slope. Explained to him and his wife that he was now entering the window for advanced therapies. We had a long talk about prognosis. He does not have too many comorbidities at this point. He is interested in pursuing advanced therapies if needed. Spoke to Dr. Martinique who will perform R and LHC next week. We will add hydralazine/IMDUR for afterload reduction. Stop potassium. Instructed to take lasix if his weight is 163 pounds or greater. Follow up in 3 weeks.   Total time 70 minutes with at least half of that time dedicated to discussions noted above.

## 2012-11-24 NOTE — Patient Instructions (Addendum)
Follow up 3 weeks.  Stop potassium  Take lasix if your weight is 163 pounds.   Take hydralazine 12.5 mg three times a day  Take IMDUR 15 mg daily  Do the following things EVERYDAY: 1) Weigh yourself in the morning before breakfast. Write it down and keep it in a log. 2) Take your medicines as prescribed 3) Eat low salt foods-Limit salt (sodium) to 2000 mg per day.  4) Stay as active as you can everyday 5) Limit all fluids for the day to less than 2 liters

## 2012-11-25 ENCOUNTER — Telehealth: Payer: Self-pay | Admitting: Cardiology

## 2012-11-25 NOTE — Telephone Encounter (Signed)
Spoke to patient's wife she stated patient is scheduled for a cardiac cath next week and she needed to know time and directions.Wife was told to call Dr.Bensimhon's office and call back if needed.

## 2012-11-25 NOTE — Telephone Encounter (Signed)
New Problem:    Patient's wife called in wanting to know the instructions and arrival time for her husbands procedure on 11/29/12.  Please call back.

## 2012-11-25 NOTE — Progress Notes (Signed)
Patient ID: Johnny Oneill, male   DOB: [birth year], 72 y.o.   MRN: [id number]  PCP: Dr Johnny Oneill Cardiologist: Dr Johnny Oneill  HPI: Johnny Oneill is a 72 year old male referred by Johnny Merle NP-C for further evaluation of advanced HF. He is here with his wife.  He has a h/o CAD s/p stent to LCX 2004 with re-occlusion of LCX with lateral MI 2009 and repeat stenting with a Promus, total occlusion of RCA, chronic systolic heart failure EF 35%, ICD 2009, PVCs, hypothyroidism, hyperlipidemia, and arthritis. S/P L TKR 2013. Last Cath 2009.   10/2012 ECHO EF 25-30%. Mild to mod Johnny. RV ok.   11/08/2012 Myoview- Large scar present with prior MI    11/14/12 CPX Peak VO2: 16.2 ml/kg/min  predicted peak VO2: 58.4% VE/VCO2 slope: 46. OUES: 1.48 Peak RER: 1.15 Ventilatory Threshold: 10.2 % predicted peak VO2: 36.8%  11/09/12 Creatinine 1.4 Potassium 5.2  Complains of progressive fatigue and dyspnea. Dry cough. Denies dyspnea at rest/PND/CP. Sleeps on 1 pillow at night. Poor appetite. He is unable to walk to the back of the grocery store without multiple stops due to dyspnea. Very complaint with meds and daily weights. Weight at home 160-165 pounds. He only takes lasix if his weight is 165 pounds. He says he takes it once a week. He does not smoke or drink alcohol. 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   Unable to tolerate ACE/ARB due to hypotension and severe hyperkalemia.    Review of Systems:     Cardiac Review of Systems: {Y] = yes [ ]  = no  Chest Pain [    ]  Resting SOB [   ] Exertional SOB  [ Y ]  Orthopnea [  ]   Pedal Edema [   ]    Palpitations [  ] Syncope  [  ]   Presyncope [   ]  General Review of Systems: [Y] = yes [  ]=no Constitional: recent weight change [Y  ]; anorexia [  ]; fatigue [  Y]; nausea [  ]; night sweats [  ]; fever [  ]; or chills [  ];                                                                              Dental: poor dentition[  ]; Last Dentist visit:   Eye :  blurred vision [  ]; diplopia [   ]; vision changes [  ];  Amaurosis fugax[  ]; Resp: cough [Y  ];  wheezing[  ];  hemoptysis[  ]; shortness of breath[  ]; paroxysmal nocturnal dyspnea[  ]; dyspnea on exertion[ Y ]; or orthopnea[  ];  GI:  gallstones[  ], vomiting[  ];  dysphagia[  ]; melena[  ];  hematochezia [  ]; heartburn[  ];   Hx of  Colonoscopy[  ]; GU: kidney stones [  ]; hematuria[  ];   dysuria [  ];  nocturia[  ];  history of     obstruction [  ];                 Skin: rash, swelling[  ];, hair loss[  ];  peripheral edema[  ];  or itching[  ]; Musculosketetal: myalgias[  ];  joint swelling[  ];  joint erythema[  ];  joint pain[  ];  back pain[  ];  Heme/Lymph: bruising[  ];  bleeding[  ];  anemia[  ];  Neuro: TIA[  ];  headaches[  ];  stroke[  ];  vertigo[  ];  seizures[  ];   paresthesias[  ];  difficulty walking[  ];  Psych:depression[  ]; anxiety[  ];  Endocrine: diabetes[  ];  thyroid dysfunction[ Y ];  Immunizations: Flu [  ]; Pneumococcal[  ];  Other:    Past Medical History  Diagnosis Date  . Ischemic cardiomyopathy     a. 10/09 Echo: Sev LV Dysfxn, inf/lat AK, Mod Johnny.  [name];  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  Medication Sig Dispense Refill  . aspirin 325 MG tablet Take 325 mg by mouth daily.        . carvedilol (COREG) 6.25 MG tablet Take 9.375 mg by mouth 2 (two) times daily with a meal. Take one and a half tablets (9.875 mg) two times a day      . clopidogrel (PLAVIX) 75 MG tablet Take 1 tablet (75 mg total)  by mouth daily.  90 tablet  3  . Coenzyme Q10 (CO Q-10 PO) Take 1 capsule by mouth daily.      [name] ezetimibe (ZETIA) 10 MG tablet Take 1 tablet (10 mg total) by mouth daily.  90 tablet  3  . levothyroxine (SYNTHROID, LEVOTHROID) 150 MCG tablet Take 150 mcg by mouth daily.      . nitroGLYCERIN (NITROSTAT) 0.4 MG SL tablet Place 0.4 mg under the tongue every 5 (five) minutes as needed. For chest pain      . oxyCODONE-acetaminophen (PERCOCET) 5-325 MG per tablet Take 0.5-1 tablets by mouth 2 (two) times daily as needed. For pain.      . rosuvastatin (CRESTOR) 20 MG tablet Take 1 tablet (20 mg total) by mouth daily.  90 tablet  3  . hydrALAZINE (APRESOLINE) 25 MG tablet Take 0.5 tablets (12.5 mg total) by mouth 3 (three) times daily.  60 tablet  3  . isosorbide mononitrate (IMDUR) 30 MG 24 hr tablet Take 0.5 tablets (15 mg total) by mouth daily.  30 tablet  3   No current facility-administered medications for this encounter.     Allergies  Allergen Reactions  . Diovan (Valsartan) Other (See Comments)    Hypotension at low dose, hyperkalemia  . Lipitor (Atorvastatin Calcium) Other (See Comments)    Muscle pain    History   Social History  . Marital Status: Married    Spouse Name: N/A    Number of Children: 1  . Years of Education: N/A   Occupational History  . referee   . cleaning company    Social History Main Topics  . Smoking  status: Never Smoker   . Smokeless tobacco: Never Used  . Alcohol Use: Yes     Comment: "occassionlly; last beer was 09/2011"  . Drug Use: No     Comment: "back in our younger days we smoked a little pot"  . Sexually Active: Not Currently   Other Topics Concern  . Not on file   Social History Narrative  . No narrative on file    Family History  Problem Relation Age of Onset  . Heart failure Father   . Stroke Father   . Coronary artery disease Mother   . Heart disease Mother   . Hyperlipidemia Sister     PHYSICAL EXAM: Filed Vitals:    11/24/12 1142  BP: 110/68  Pulse: 58   General: No respiratory difficulty Wife present  HEENT: normal Neck: supple.  JVD 9. Carotids 2+ bilat; no bruits. No lymphadenopathy or thryomegaly appreciated. Cor: PMI laterally displaced. Regular rate & rhythm. No rubs, gallop.s 2/6 Johnny murmur Lungs: clear Abdomen: soft, nontender, nondistended. No hepatosplenomegaly. No bruits or masses. Good bowel sounds. Extremities: no cyanosis, clubbing, rash, RLE LLE trace-1 +edema Neuro: alert & oriented x 3, cranial nerves grossly intact. moves all 4 extremities w/o difficulty. Affect pleasant.    Results for orders placed during the hospital encounter of 11/24/12 (from the past 24 hour(s))  CBC     Status: Abnormal   Collection Time    11/24/12  1:04 PM      Result Value Range   WBC 8.3  4.0 - 10.5 K/uL   RBC 5.04  4.22 - 5.81 MIL/uL   Hemoglobin 14.3  13.0 - 17.0 g/dL   HCT 43.5  39.0 - 52.0 %   MCV 86.3  78.0 - 100.0 fL   MCH 28.4  26.0 - 34.0 pg   MCHC 32.9  30.0 - 36.0 g/dL   RDW 16.9 (*) 11.5 - 15.5 %   Platelets 178  150 - 400 K/uL  BASIC METABOLIC PANEL     Status: Abnormal   Collection Time    11/24/12  1:04 PM      Result Value Range   Sodium 142  135 - 145 mEq/L   Potassium 4.5  3.5 - 5.1 mEq/L   Chloride 104  96 - 112 mEq/L   CO2 26  19 - 32 mEq/L   Glucose, Bld 100 (*) 70 - 99 mg/dL   BUN 37 (*) 6 - 23 mg/dL   Creatinine, Ser 1.65 (*) 0.50 - 1.35 mg/dL   Calcium 9.5  8.4 - 10.5 mg/dL   GFR calc non Af Amer 41 (*) >90 mL/min   GFR calc Af Amer 47 (*) >90 mL/min  PROTIME-INR     Status: None   Collection Time    11/24/12  1:04 PM      Result Value Range   Prothrombin Time 15.1  11.6 - 15.2 seconds   INR 1.21  0.00 - 1.49   No results found.   ASSESSMENT & PLAN:

## 2012-11-27 ENCOUNTER — Encounter (HOSPITAL_BASED_OUTPATIENT_CLINIC_OR_DEPARTMENT_OTHER): Payer: Self-pay | Admitting: *Deleted

## 2012-11-27 ENCOUNTER — Emergency Department (HOSPITAL_BASED_OUTPATIENT_CLINIC_OR_DEPARTMENT_OTHER)
Admission: EM | Admit: 2012-11-27 | Discharge: 2012-11-28 | Disposition: A | Discharge status: Home / Self Care | Payer: Medicare Other | Attending: Emergency Medicine | Admitting: Emergency Medicine

## 2012-11-27 DIAGNOSIS — Y929 Unspecified place or not applicable: Secondary | ICD-10-CM | POA: Insufficient documentation

## 2012-11-27 DIAGNOSIS — W261XXA Contact with sword or dagger, initial encounter: Secondary | ICD-10-CM | POA: Insufficient documentation

## 2012-11-27 DIAGNOSIS — E039 Hypothyroidism, unspecified: Secondary | ICD-10-CM | POA: Insufficient documentation

## 2012-11-27 DIAGNOSIS — I10 Essential (primary) hypertension: Secondary | ICD-10-CM | POA: Insufficient documentation

## 2012-11-27 DIAGNOSIS — F411 Generalized anxiety disorder: Secondary | ICD-10-CM | POA: Insufficient documentation

## 2012-11-27 DIAGNOSIS — Z9861 Coronary angioplasty status: Secondary | ICD-10-CM | POA: Insufficient documentation

## 2012-11-27 DIAGNOSIS — Z8679 Personal history of other diseases of the circulatory system: Secondary | ICD-10-CM | POA: Insufficient documentation

## 2012-11-27 DIAGNOSIS — W260XXA Contact with knife, initial encounter: Secondary | ICD-10-CM | POA: Insufficient documentation

## 2012-11-27 DIAGNOSIS — Z8739 Personal history of other diseases of the musculoskeletal system and connective tissue: Secondary | ICD-10-CM | POA: Insufficient documentation

## 2012-11-27 DIAGNOSIS — I252 Old myocardial infarction: Secondary | ICD-10-CM | POA: Insufficient documentation

## 2012-11-27 DIAGNOSIS — Z7902 Long term (current) use of antithrombotics/antiplatelets: Secondary | ICD-10-CM | POA: Insufficient documentation

## 2012-11-27 DIAGNOSIS — Y9389 Activity, other specified: Secondary | ICD-10-CM | POA: Insufficient documentation

## 2012-11-27 DIAGNOSIS — Z79899 Other long term (current) drug therapy: Secondary | ICD-10-CM | POA: Insufficient documentation

## 2012-11-27 DIAGNOSIS — Z7982 Long term (current) use of aspirin: Secondary | ICD-10-CM | POA: Insufficient documentation

## 2012-11-27 DIAGNOSIS — E7800 Pure hypercholesterolemia, unspecified: Secondary | ICD-10-CM | POA: Insufficient documentation

## 2012-11-27 DIAGNOSIS — S61219A Laceration without foreign body of unspecified finger without damage to nail, initial encounter: Secondary | ICD-10-CM

## 2012-11-27 DIAGNOSIS — I2510 Atherosclerotic heart disease of native coronary artery without angina pectoris: Secondary | ICD-10-CM | POA: Insufficient documentation

## 2012-11-27 DIAGNOSIS — S61209A Unspecified open wound of unspecified finger without damage to nail, initial encounter: Secondary | ICD-10-CM | POA: Insufficient documentation

## 2012-11-27 DIAGNOSIS — Z950 Presence of cardiac pacemaker: Secondary | ICD-10-CM | POA: Insufficient documentation

## 2012-11-27 NOTE — ED Notes (Signed)
Pt reports cutting his finger on a knife tonight.  Went to a fire department to attempt to get bleeding stopped.  Pt on multiple blood thinners.  Pt has a 2cm lac on the tip of his (L) pointer finger-pt bleeding slightly.

## 2012-11-27 NOTE — ED Provider Notes (Signed)
History  This chart was scribed for Johnny Fines, MD by Jenne Campus, ED Scribe. This patient was seen in room MH09/MH09 and the patient's care was started at 11:29 PM.  CSN: [id number]  Arrival date & time 11/27/12  2236   First MD Initiated Contact with Patient 11/27/12 2329      Chief Complaint  Patient presents with  . Finger Injury    The history is provided by the patient. No language interpreter was used.    Johnny Oneill is a 72 y.o. male who presents to the Emergency Department complaining of a sudden onset laceartion to the left index finger while drying off a serrated knife. Pt states that had just washed the knife when he was drying it with a towel. He denies any significant pain. Pt is currently on ASA and plavix for a cardiac catheterization scheduled in 2 days. He was seen by the fire department to stop the bleeding but due to him currently being on blood thinners with continuous bleeding, he was told to come to the ED. He denies lightheadedness, nausea, HA and CP as associated symptoms. Last TD was 3 years ago.   Past Medical History  Diagnosis Date  . Ischemic cardiomyopathy     a. 10/09 Echo: Sev LV Dysfxn, inf/lat AK, Mod MR.  [name];  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 Hypertension   . PVC (premature ventricular contraction)   . Pacemaker     Past Surgical History  Procedure Laterality Date  . Defibrillator  2009  . Total knee arthroplasty  11/27/11    left  . Insert / replace / remove pacemaker  2009  . Coronary angioplasty with stent placement  2004  . Coronary angioplasty with stent placement  07/2008  .  Knee arthroscopy w/ partial medial meniscectomy  12/2002    left  . Knee arthroscopy      bilaterally  . Total knee arthroplasty  11/27/2011    Procedure: TOTAL KNEE ARTHROPLASTY;  Surgeon: Yvette Rack., MD;  Location: [facility];  Service: Orthopedics;  Laterality: Left;  left total knee arthroplasty    Family History  Problem Relation Age of Onset  . Heart failure Father   . Stroke Father   . Coronary artery disease Mother   . Heart disease Mother   . Hyperlipidemia Sister     History  Substance Use Topics  . Smoking status: Never Smoker   . Smokeless tobacco: Never Used  . Alcohol Use: Yes     Comment: "occassionlly; last beer was 09/2011"      Review of Systems  A complete 10 system review of systems was obtained and all systems are negative except as noted in the HPI and PMH.   Allergies  Diovan and Lipitor  Home Medications   Current Outpatient Rx  Name  Route  Sig  Dispense  Refill  . aspirin 325 MG tablet   Oral   Take 325 mg by mouth daily.           [name]  carvedilol (COREG) 6.25 MG tablet   Oral   Take 9.375 mg by mouth 2 (two) times daily with a meal. Take one and a half tablets (9.875 mg) two times a day         . clopidogrel (PLAVIX) 75 MG tablet   Oral   Take 1 tablet (75 mg total) by mouth daily.   90 tablet   3   . Coenzyme Q10 (CO Q-10 PO)   Oral   Take 1 capsule by mouth daily.         [name] ezetimibe (ZETIA) 10 MG tablet   Oral   Take 1 tablet (10 mg total) by mouth daily.   90 tablet   3   . hydrALAZINE (APRESOLINE) 25 MG tablet   Oral   Take 0.5 tablets (12.5 mg total) by mouth 3 (three) times daily.   60 tablet   3   . isosorbide mononitrate (IMDUR) 30 MG 24 hr tablet   Oral   Take 0.5 tablets (15 mg total) by mouth daily.   30 tablet   3   . levothyroxine (SYNTHROID, LEVOTHROID) 150 MCG tablet   Oral   Take 150 mcg by mouth daily.         . nitroGLYCERIN (NITROSTAT) 0.4 MG SL tablet   Sublingual   Place 0.4 mg under the  tongue every 5 (five) minutes as needed. For chest pain         . oxyCODONE-acetaminophen (PERCOCET) 5-325 MG per tablet   Oral   Take 0.5-1 tablets by mouth 2 (two) times daily as needed. For pain.         . rosuvastatin (CRESTOR) 20 MG tablet   Oral   Take 1 tablet (20 mg total) by mouth daily.   90 tablet   3     Triage Vitals: BP 144/82  Pulse 58  Temp(Src) 98 F (36.7 C) (Oral)  Resp 18  SpO2 99%  Physical Exam  Nursing note and vitals reviewed. Constitutional: He is oriented to person, place, and time. He appears well-developed and well-nourished. No distress.  HENT:  Head: Normocephalic and atraumatic.  Eyes: Conjunctivae and EOM are normal.  Neck: Neck supple. No tracheal deviation present.  Cardiovascular: Normal rate.  An irregular rhythm present.  Pulmonary/Chest: Effort normal and breath sounds normal. No respiratory distress.  Musculoskeletal: Normal range of motion. He exhibits edema (trace edema to the lower legs).  Intact pulses  Neurological: He is alert and oriented to person, place, and time.  Skin: Skin is warm and dry.  1 cm laceration to the pad of the left index finger that is oozing blood  Psychiatric: He has a normal mood and affect. His behavior is normal.    ED Course  Procedures (including critical care time)  DIAGNOSTIC STUDIES: Oxygen Saturation is 99% on room air, normal by my interpretation.    COORDINATION OF CARE: 11:33 PM-Discussed treatment plan which includes laceration repair with pt at bedside and pt agreed to plan.    LACERATION REPAIR Performed by: Jamse Mead Authorized by: Jamse Mead Consent: Verbal consent obtained. Risks and benefits: risks, benefits and alternatives were discussed Consent given by: patient Patient identity confirmed: provided demographic data Prepped and Draped in normal sterile fashion Wound explored  Laceration Location: Pad of left index finger  Laceration Length: 1-1.5  cm  No Foreign Bodies seen or palpated  Anesthesia: local infiltration  Local anesthetic: lidocaine 2 % with epinephrine  Anesthetic total: 1.5-2 ml  Irrigation method: syringe Amount of cleaning: standard  Skin closure: simple  Number of sutures: 4  Technique: single interrupted  Patient tolerance: Patient tolerated the procedure well with no immediate complications.    MDM  I personally performed the services described in this documentation, which was scribed in my presence.  The recorded information has been reviewed and is accurate.    Jamse Mead, PA-C 11/28/12 0041

## 2012-11-28 ENCOUNTER — Telehealth: Payer: Self-pay | Admitting: Cardiology

## 2012-11-28 NOTE — Telephone Encounter (Signed)
New problem    shantelle from dr bensimhon's office called about orders that need to be entered by dr Martinique and pts wife has questions about the procedure

## 2012-11-28 NOTE — Telephone Encounter (Signed)
Spoke with patient's wife this morning,she stated Dr.Bensimhon saw husband last week 11/24/12 and he talked with Dr.Jordan.States a cath was suppose to be scheduled.Rt and Lf cardiac cath scheduled with Dr.Jordan 12/01/12.Patient had pre cath lab work 11/24/12.Wife was given cath instructions over the phone and she understands and will have patient at [facility] cath lab 12/01/12 at 10:30 am.

## 2012-11-28 NOTE — ED Provider Notes (Signed)
Laceration to pad of left index finger was closed using 6-0 Ethilon and single interrupted sutures. Was instructed by physician to use lidocaine 2% with epinephrine due to patient being on Aspirin and Plavix. Procedure performed in sterile manner. Patient tolerated procedure well.   Jamse Mead, PA-C 11/28/12 1315

## 2012-11-28 NOTE — ED Notes (Signed)
D/c home with family- no new rx given

## 2012-11-28 NOTE — Telephone Encounter (Signed)
Follow-up:    Patient called in today wanting to know what was supposed to be happening with regards to the cath that he is supposed to be scheduled for on 11/29/12 per conversation between Dr. Haroldine Laws and Dr. Martinique.  Please call back.

## 2012-11-28 NOTE — ED Notes (Signed)
PA at bedside suturing.

## 2012-11-28 NOTE — Telephone Encounter (Signed)
Follow up   Now pt's wife is calling in reference to procedure for pt having cath. Please call pt's wife concerning this matter.

## 2012-11-28 NOTE — ED Provider Notes (Signed)
Medical screening examination/treatment/procedure(s) were conducted as a shared visit with non-physician practitioner(s) and myself.  I personally evaluated the patient during the encounter   Wynetta Fines, MD 11/28/12 0120

## 2012-11-30 ENCOUNTER — Other Ambulatory Visit: Payer: Self-pay | Admitting: Cardiology

## 2012-11-30 DIAGNOSIS — I509 Heart failure, unspecified: Secondary | ICD-10-CM

## 2012-11-30 NOTE — Telephone Encounter (Signed)
Patient called spoke to wife was told patient's creatinine elevated.Advised to hold lasix today 11/30/12 and drink extra fluids today.Keep appointment for cath 12/02/11 as planned.

## 2012-12-01 ENCOUNTER — Encounter (HOSPITAL_BASED_OUTPATIENT_CLINIC_OR_DEPARTMENT_OTHER)
Admission: RE | Disposition: A | Discharge status: Home / Self Care | Payer: Self-pay | Source: Ambulatory Visit | Attending: Cardiology

## 2012-12-01 ENCOUNTER — Inpatient Hospital Stay (HOSPITAL_BASED_OUTPATIENT_CLINIC_OR_DEPARTMENT_OTHER)
Admission: RE | Admit: 2012-12-01 | Discharge: 2012-12-01 | Disposition: A | Discharge status: Home / Self Care | Payer: Medicare Other | Source: Ambulatory Visit | Attending: Cardiology | Admitting: Cardiology

## 2012-12-01 DIAGNOSIS — I509 Heart failure, unspecified: Secondary | ICD-10-CM

## 2012-12-01 DIAGNOSIS — R0609 Other forms of dyspnea: Secondary | ICD-10-CM | POA: Insufficient documentation

## 2012-12-01 DIAGNOSIS — I2789 Other specified pulmonary heart diseases: Secondary | ICD-10-CM | POA: Insufficient documentation

## 2012-12-01 DIAGNOSIS — R0989 Other specified symptoms and signs involving the circulatory and respiratory systems: Secondary | ICD-10-CM | POA: Insufficient documentation

## 2012-12-01 DIAGNOSIS — Z95810 Presence of automatic (implantable) cardiac defibrillator: Secondary | ICD-10-CM | POA: Insufficient documentation

## 2012-12-01 DIAGNOSIS — E7800 Pure hypercholesterolemia, unspecified: Secondary | ICD-10-CM | POA: Insufficient documentation

## 2012-12-01 DIAGNOSIS — I10 Essential (primary) hypertension: Secondary | ICD-10-CM | POA: Insufficient documentation

## 2012-12-01 DIAGNOSIS — Z9861 Coronary angioplasty status: Secondary | ICD-10-CM | POA: Insufficient documentation

## 2012-12-01 DIAGNOSIS — I2510 Atherosclerotic heart disease of native coronary artery without angina pectoris: Secondary | ICD-10-CM

## 2012-12-01 DIAGNOSIS — I2589 Other forms of chronic ischemic heart disease: Secondary | ICD-10-CM | POA: Insufficient documentation

## 2012-12-01 DIAGNOSIS — I519 Heart disease, unspecified: Secondary | ICD-10-CM | POA: Insufficient documentation

## 2012-12-01 LAB — POCT I-STAT 3, ART BLOOD GAS (G3+)
Acid-base deficit: 3 mmol/L — ABNORMAL HIGH (ref 0.0–2.0)
Bicarbonate: 19.5 mEq/L — ABNORMAL LOW (ref 20.0–24.0)
O2 Saturation: 94 %
TCO2: 20 mmol/L (ref 0–100)
pCO2 arterial: 29 mmHg — ABNORMAL LOW (ref 35.0–45.0)
pH, Arterial: 7.436 (ref 7.350–7.450)
pO2, Arterial: 65 mmHg — ABNORMAL LOW (ref 80.0–100.0)

## 2012-12-01 LAB — POCT I-STAT 3, VENOUS BLOOD GAS (G3P V)
Acid-base deficit: 2 mmol/L (ref 0.0–2.0)
Bicarbonate: 22.6 mEq/L (ref 20.0–24.0)
O2 Saturation: 51 %
TCO2: 24 mmol/L (ref 0–100)
pCO2, Ven: 37 mmHg — ABNORMAL LOW (ref 45.0–50.0)
pH, Ven: 7.393 — ABNORMAL HIGH (ref 7.250–7.300)
pO2, Ven: 27 mmHg — CL (ref 30.0–45.0)

## 2012-12-01 SURGERY — JV LEFT AND RIGHT HEART CATHETERIZATION WITH CORONARY ANGIOGRAM
Anesthesia: Moderate Sedation

## 2012-12-01 MED ORDER — SODIUM CHLORIDE 0.9 % IJ SOLN: 3.0000 mL | Freq: Two times a day (BID) | INTRAMUSCULAR | Status: DC

## 2012-12-01 MED ORDER — DIAZEPAM 5 MG PO TABS
5.0000 mg | ORAL_TABLET | ORAL | Status: AC
  Administered 2012-12-01: 5 mg via ORAL

## 2012-12-01 MED ORDER — SODIUM CHLORIDE 0.9 % IV SOLN: 250.0000 mL | INTRAVENOUS | Status: DC | PRN

## 2012-12-01 MED ORDER — ASPIRIN 81 MG PO CHEW
324.0000 mg | CHEWABLE_TABLET | ORAL | Status: AC
  Administered 2012-12-01: 324 mg via ORAL

## 2012-12-01 MED ORDER — SODIUM CHLORIDE 0.9 % IJ SOLN: 3.0000 mL | INTRAMUSCULAR | Status: DC | PRN

## 2012-12-01 MED ORDER — SODIUM CHLORIDE 0.9 % IV SOLN: 1.0000 mL/kg/h | INTRAVENOUS | Status: DC

## 2012-12-01 MED ORDER — ONDANSETRON HCL 4 MG/2ML IJ SOLN: 4.0000 mg | Freq: Four times a day (QID) | INTRAMUSCULAR | Status: DC | PRN

## 2012-12-01 MED ORDER — ACETAMINOPHEN 325 MG PO TABS: 650.0000 mg | ORAL_TABLET | ORAL | Status: DC | PRN

## 2012-12-01 MED ORDER — SODIUM CHLORIDE 0.9 % IV SOLN
INTRAVENOUS | Status: DC
  Administered 2012-12-01: 12:00:00 via INTRAVENOUS

## 2012-12-01 NOTE — Interval H&P Note (Signed)
History and Physical Interval Note:  12/01/2012 2:13 PM  Johnny Oneill  has presented today for surgery, with the diagnosis of SOB  The various methods of treatment have been discussed with the patient and family. After consideration of risks, benefits and other options for treatment, the patient has consented to  Procedure(s): JV LEFT AND RIGHT HEART CATHETERIZATION WITH CORONARY ANGIOGRAM (N/A) as a surgical intervention .  The patient's history has been reviewed, patient examined, no change in status, stable for surgery.  I have reviewed the patient's chart and labs.  Questions were answered to the patient's satisfaction.     Collier Salina [facility] 12/01/2012 2:14 PM

## 2012-12-01 NOTE — Progress Notes (Signed)
Bedrest begins @ [id number], tegaderm dressing applied to right groin site by Suella Broad.

## 2012-12-01 NOTE — H&P (View-Only) (Signed)
Patient ID: Johnny Oneill, male   DOB: 1940/09/13, 72 y.o.   MRN: [id number]  PCP: Dr Johnny Oneill Cardiologist: Dr Johnny Oneill  HPI: Johnny Oneill is a 72 year old male referred by Johnny Merle NP-C for further evaluation of advanced HF. He is here with his wife.  He has a h/o CAD s/p stent to LCX 2004 with re-occlusion of LCX with lateral MI 2009 and repeat stenting with a Promus, total occlusion of RCA, chronic systolic heart failure EF 35%, ICD 2009, PVCs, hypothyroidism, hyperlipidemia, and arthritis. S/P L TKR 2013. Last Cath 2009.   10/2012 ECHO EF 25-30%. Mild to mod Johnny. RV ok.   11/08/2012 Myoview- Large scar present with prior MI    11/14/12 CPX Peak VO2: 16.2 ml/kg/min  predicted peak VO2: 58.4% VE/VCO2 slope: 46. OUES: 1.48 Peak RER: 1.15 Ventilatory Threshold: 10.2 % predicted peak VO2: 36.8%  11/09/12 Creatinine 1.4 Potassium 5.2  Complains of progressive fatigue and dyspnea. Dry cough. Denies dyspnea at rest/PND/CP. Sleeps on 1 pillow at night. Poor appetite. He is unable to walk to the back of the grocery store without multiple stops due to dyspnea. Very complaint with meds and daily weights. Weight at home 160-165 pounds. He only takes lasix if his weight is 165 pounds. He says he takes it once a week. He does not smoke or drink alcohol. 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   Unable to tolerate ACE/ARB due to hypotension and severe hyperkalemia.    Review of Systems:     Cardiac Review of Systems: {Y] = yes [ ]  = no  Chest Pain [    ]  Resting SOB [   ] Exertional SOB  [ Y ]  Orthopnea [  ]   Pedal Edema [   ]    Palpitations [  ] Syncope  [  ]   Presyncope [   ]  General Review of Systems: [Y] = yes [  ]=no Constitional: recent weight change [Y  ]; anorexia [  ]; fatigue [  Y]; nausea [  ]; night sweats [  ]; fever [  ]; or chills [  ];                                                                              Dental: poor dentition[  ]; Last Dentist visit:   Eye :  blurred vision [  ]; diplopia [   ]; vision changes [  ];  Amaurosis fugax[  ]; Resp: cough [Y  ];  wheezing[  ];  hemoptysis[  ]; shortness of breath[  ]; paroxysmal nocturnal dyspnea[  ]; dyspnea on exertion[ Y ]; or orthopnea[  ];  GI:  gallstones[  ], vomiting[  ];  dysphagia[  ]; melena[  ];  hematochezia [  ]; heartburn[  ];   Hx of  Colonoscopy[  ]; GU: kidney stones [  ]; hematuria[  ];   dysuria [  ];  nocturia[  ];  history of     obstruction [  ];                 Skin: rash, swelling[  ];, hair loss[  ];  peripheral edema[  ];  or itching[  ]; Musculosketetal: myalgias[  ];  joint swelling[  ];  joint erythema[  ];  joint pain[  ];  back pain[  ];  Heme/Lymph: bruising[  ];  bleeding[  ];  anemia[  ];  Neuro: TIA[  ];  headaches[  ];  stroke[  ];  vertigo[  ];  seizures[  ];   paresthesias[  ];  difficulty walking[  ];  Psych:depression[  ]; anxiety[  ];  Endocrine: diabetes[  ];  thyroid dysfunction[ Y ];  Immunizations: Flu [  ]; Pneumococcal[  ];  Other:    Past Medical History  Diagnosis Date  . Ischemic cardiomyopathy     a. 10/09 Echo: Sev LV Dysfxn, inf/lat AK, Mod Johnny.  [name];  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  Medication Sig Dispense Refill  . aspirin 325 MG tablet Take 325 mg by mouth daily.        . carvedilol (COREG) 6.25 MG tablet Take 9.375 mg by mouth 2 (two) times daily with a meal. Take one and a half tablets (9.875 mg) two times a day      . clopidogrel (PLAVIX) 75 MG tablet Take 1 tablet (75 mg total)  by mouth daily.  90 tablet  3  . Coenzyme Q10 (CO Q-10 PO) Take 1 capsule by mouth daily.      [name] ezetimibe (ZETIA) 10 MG tablet Take 1 tablet (10 mg total) by mouth daily.  90 tablet  3  . levothyroxine (SYNTHROID, LEVOTHROID) 150 MCG tablet Take 150 mcg by mouth daily.      . nitroGLYCERIN (NITROSTAT) 0.4 MG SL tablet Place 0.4 mg under the tongue every 5 (five) minutes as needed. For chest pain      . oxyCODONE-acetaminophen (PERCOCET) 5-325 MG per tablet Take 0.5-1 tablets by mouth 2 (two) times daily as needed. For pain.      . rosuvastatin (CRESTOR) 20 MG tablet Take 1 tablet (20 mg total) by mouth daily.  90 tablet  3  . hydrALAZINE (APRESOLINE) 25 MG tablet Take 0.5 tablets (12.5 mg total) by mouth 3 (three) times daily.  60 tablet  3  . isosorbide mononitrate (IMDUR) 30 MG 24 hr tablet Take 0.5 tablets (15 mg total) by mouth daily.  30 tablet  3   No current facility-administered medications for this encounter.     Allergies  Allergen Reactions  . Diovan (Valsartan) Other (See Comments)    Hypotension at low dose, hyperkalemia  . Lipitor (Atorvastatin Calcium) Other (See Comments)    Muscle pain    History   Social History  . Marital Status: Married    Spouse Name: N/A    Number of Children: 1  . Years of Education: N/A   Occupational History  . referee   . cleaning company    Social History Main Topics  . Smoking  status: Never Smoker   . Smokeless tobacco: Never Used  . Alcohol Use: Yes     Comment: "occassionlly; last beer was 09/2011"  . Drug Use: No     Comment: "back in our younger days we smoked a little pot"  . Sexually Active: Not Currently   Other Topics Concern  . Not on file   Social History Narrative  . No narrative on file    Family History  Problem Relation Age of Onset  . Heart failure Father   . Stroke Father   . Coronary artery disease Mother   . Heart disease Mother   . Hyperlipidemia Sister     PHYSICAL EXAM: Filed Vitals:    11/24/12 1142  BP: 110/68  Pulse: 58   General: No respiratory difficulty Wife present  HEENT: normal Neck: supple.  JVD 9. Carotids 2+ bilat; no bruits. No lymphadenopathy or thryomegaly appreciated. Cor: PMI laterally displaced. Regular rate & rhythm. No rubs, gallop.s 2/6 Johnny murmur Lungs: clear Abdomen: soft, nontender, nondistended. No hepatosplenomegaly. No bruits or masses. Good bowel sounds. Extremities: no cyanosis, clubbing, rash, RLE LLE trace-1 +edema Neuro: alert & oriented x 3, cranial nerves grossly intact. moves all 4 extremities w/o difficulty. Affect pleasant.    Results for orders placed during the hospital encounter of 11/24/12 (from the past 24 hour(s))  CBC     Status: Abnormal   Collection Time    11/24/12  1:04 PM      Result Value Range   WBC 8.3  4.0 - 10.5 K/uL   RBC 5.04  4.22 - 5.81 MIL/uL   Hemoglobin 14.3  13.0 - 17.0 g/dL   HCT 43.5  39.0 - 52.0 %   MCV 86.3  78.0 - 100.0 fL   MCH 28.4  26.0 - 34.0 pg   MCHC 32.9  30.0 - 36.0 g/dL   RDW 16.9 (*) 11.5 - 15.5 %   Platelets 178  150 - 400 K/uL  BASIC METABOLIC PANEL     Status: Abnormal   Collection Time    11/24/12  1:04 PM      Result Value Range   Sodium 142  135 - 145 mEq/L   Potassium 4.5  3.5 - 5.1 mEq/L   Chloride 104  96 - 112 mEq/L   CO2 26  19 - 32 mEq/L   Glucose, Bld 100 (*) 70 - 99 mg/dL   BUN 37 (*) 6 - 23 mg/dL   Creatinine, Ser 1.65 (*) 0.50 - 1.35 mg/dL   Calcium 9.5  8.4 - 10.5 mg/dL   GFR calc non Af Amer 41 (*) >90 mL/min   GFR calc Af Amer 47 (*) >90 mL/min  PROTIME-INR     Status: None   Collection Time    11/24/12  1:04 PM      Result Value Range   Prothrombin Time 15.1  11.6 - 15.2 seconds   INR 1.21  0.00 - 1.49   No results found.   ASSESSMENT & PLAN:

## 2012-12-01 NOTE — CV Procedure (Signed)
   Cardiac Catheterization Procedure Note  Name: Johnny Oneill MRN: [id number] DOB: Oct 13, 1940  Procedure: Right Heart Cath, Left Heart Cath, Selective Coronary Angiography  Indication: 72 yo WM with history of ischemic cardiomyopathy presents with worsening dyspnea and abnormal cardiopulmonary stress test. EF 25% by Echo.   Procedural Details: The right groin was prepped, draped, and anesthetized with 1% lidocaine. Using the modified Seldinger technique a 4 French sheath was placed in the right femoral artery and a 7 French sheath was placed in the right femoral vein. A Swan-Ganz catheter was used for the right heart catheterization. Standard protocol was followed for recording of right heart pressures and sampling of oxygen saturations. Fick cardiac output was calculated. Standard Judkins catheters were used for selective coronary angiography and left ventriculography. There were no immediate procedural complications. The patient was transferred to the post catheterization recovery area for further monitoring.  Procedural Findings: Hemodynamics RA 19/15, mean of 13 mm Hg RV 65/10 mm Hg PA 59/25, mean of 37 mm Hg PCWP 26/24, mean of 20 mm Hg LV 116/26 mm Hg AO 125/88 mean of 106 mm Hg  Oxygen saturations: PA 51% AO 94%  Cardiac Output (Fick) 2.8 L/min , Thermo-3 L/min Cardiac Index (Fick) 1.5 L/min/m2, Thermo 1.6 L/min/m2   Coronary angiography: Coronary dominance: right  Left mainstem: less than 10% disease  Left anterior descending (LAD): Heavily calcified with 40% segmental disease in the proximal vessel. Diffuse irregularities.  Left circumflex (LCx): Widely patent stents in the proximal and mid vessel. Less than 20% disease.  Right coronary artery (RCA): Occluded proximally. Left to right collaterals to the RCA.  Left ventriculography: not performed.   Final Conclusions:   1. Single vessel obstructive CAD with chronically occluded RCA with collaterals. Patent stents  in the LCX. 2. Moderate pulmonary HTN 3. Elevated LV filling pressures with low cardiac output.  Recommendations: Continue medical Rx.    Collier Salina [facility] 12/01/2012, 2:52 PM

## 2012-12-05 NOTE — Telephone Encounter (Signed)
See previous 11/28/12 note.

## 2012-12-07 ENCOUNTER — Inpatient Hospital Stay (HOSPITAL_COMMUNITY): Payer: Medicare Other

## 2012-12-07 ENCOUNTER — Encounter (HOSPITAL_COMMUNITY): Payer: Self-pay | Admitting: General Practice

## 2012-12-07 ENCOUNTER — Inpatient Hospital Stay (HOSPITAL_COMMUNITY)
Admission: AD | Admit: 2012-12-07 | Discharge: 2012-12-09 | DRG: 291 | Disposition: A | Discharge status: Home Health | Payer: Medicare Other | Source: Ambulatory Visit | Attending: Internal Medicine | Admitting: Internal Medicine

## 2012-12-07 DIAGNOSIS — I472 Ventricular tachycardia, unspecified: Secondary | ICD-10-CM

## 2012-12-07 DIAGNOSIS — Z9861 Coronary angioplasty status: Secondary | ICD-10-CM

## 2012-12-07 DIAGNOSIS — E7800 Pure hypercholesterolemia, unspecified: Secondary | ICD-10-CM | POA: Diagnosis present

## 2012-12-07 DIAGNOSIS — E785 Hyperlipidemia, unspecified: Secondary | ICD-10-CM | POA: Diagnosis present

## 2012-12-07 DIAGNOSIS — F411 Generalized anxiety disorder: Secondary | ICD-10-CM | POA: Diagnosis present

## 2012-12-07 DIAGNOSIS — Z01810 Encounter for preprocedural cardiovascular examination: Secondary | ICD-10-CM

## 2012-12-07 DIAGNOSIS — I4949 Other premature depolarization: Secondary | ICD-10-CM | POA: Diagnosis present

## 2012-12-07 DIAGNOSIS — I2582 Chronic total occlusion of coronary artery: Secondary | ICD-10-CM | POA: Diagnosis present

## 2012-12-07 DIAGNOSIS — I5023 Acute on chronic systolic (congestive) heart failure: Principal | ICD-10-CM

## 2012-12-07 DIAGNOSIS — I252 Old myocardial infarction: Secondary | ICD-10-CM

## 2012-12-07 DIAGNOSIS — I2510 Atherosclerotic heart disease of native coronary artery without angina pectoris: Secondary | ICD-10-CM | POA: Diagnosis present

## 2012-12-07 DIAGNOSIS — Z95810 Presence of automatic (implantable) cardiac defibrillator: Secondary | ICD-10-CM

## 2012-12-07 DIAGNOSIS — I2589 Other forms of chronic ischemic heart disease: Secondary | ICD-10-CM | POA: Diagnosis present

## 2012-12-07 DIAGNOSIS — Z7982 Long term (current) use of aspirin: Secondary | ICD-10-CM

## 2012-12-07 DIAGNOSIS — E039 Hypothyroidism, unspecified: Secondary | ICD-10-CM | POA: Diagnosis present

## 2012-12-07 DIAGNOSIS — Z7902 Long term (current) use of antithrombotics/antiplatelets: Secondary | ICD-10-CM

## 2012-12-07 DIAGNOSIS — Z96659 Presence of unspecified artificial knee joint: Secondary | ICD-10-CM

## 2012-12-07 DIAGNOSIS — Z8249 Family history of ischemic heart disease and other diseases of the circulatory system: Secondary | ICD-10-CM

## 2012-12-07 DIAGNOSIS — I493 Ventricular premature depolarization: Secondary | ICD-10-CM

## 2012-12-07 DIAGNOSIS — I059 Rheumatic mitral valve disease, unspecified: Secondary | ICD-10-CM

## 2012-12-07 DIAGNOSIS — E876 Hypokalemia: Secondary | ICD-10-CM | POA: Diagnosis present

## 2012-12-07 DIAGNOSIS — Z823 Family history of stroke: Secondary | ICD-10-CM

## 2012-12-07 DIAGNOSIS — R570 Cardiogenic shock: Secondary | ICD-10-CM

## 2012-12-07 DIAGNOSIS — Z79899 Other long term (current) drug therapy: Secondary | ICD-10-CM

## 2012-12-07 DIAGNOSIS — I509 Heart failure, unspecified: Secondary | ICD-10-CM | POA: Diagnosis present

## 2012-12-07 DIAGNOSIS — I4729 Other ventricular tachycardia: Secondary | ICD-10-CM | POA: Diagnosis present

## 2012-12-07 DIAGNOSIS — I5022 Chronic systolic (congestive) heart failure: Secondary | ICD-10-CM

## 2012-12-07 DIAGNOSIS — I10 Essential (primary) hypertension: Secondary | ICD-10-CM | POA: Diagnosis present

## 2012-12-07 HISTORY — DX: Shortness of breath: R06.02

## 2012-12-07 HISTORY — DX: Heart failure, unspecified: I50.9

## 2012-12-07 LAB — MRSA PCR SCREENING: MRSA by PCR: NEGATIVE

## 2012-12-07 LAB — CBC WITH DIFFERENTIAL/PLATELET
Basophils Absolute: 0 10*3/uL (ref 0.0–0.1)
Basophils Relative: 1 % (ref 0–1)
Eosinophils Absolute: 0.1 10*3/uL (ref 0.0–0.7)
Eosinophils Relative: 2 % (ref 0–5)
HCT: 39 % (ref 39.0–52.0)
Hemoglobin: 12.9 g/dL — ABNORMAL LOW (ref 13.0–17.0)
Lymphocytes Relative: 18 % (ref 12–46)
Lymphs Abs: 1.2 10*3/uL (ref 0.7–4.0)
MCH: 28.4 pg (ref 26.0–34.0)
MCHC: 33.1 g/dL (ref 30.0–36.0)
MCV: 85.7 fL (ref 78.0–100.0)
Monocytes Absolute: 0.5 10*3/uL (ref 0.1–1.0)
Monocytes Relative: 9 % (ref 3–12)
Neutro Abs: 4.5 10*3/uL (ref 1.7–7.7)
Neutrophils Relative %: 71 % (ref 43–77)
Platelets: 165 10*3/uL (ref 150–400)
RBC: 4.55 MIL/uL (ref 4.22–5.81)
RDW: 16.6 % — ABNORMAL HIGH (ref 11.5–15.5)
WBC: 6.4 10*3/uL (ref 4.0–10.5)

## 2012-12-07 LAB — TSH: TSH: 4.786 u[IU]/mL — ABNORMAL HIGH (ref 0.350–4.500)

## 2012-12-07 LAB — COMPREHENSIVE METABOLIC PANEL
ALT: 23 U/L (ref 0–53)
AST: 26 U/L (ref 0–37)
Albumin: 3.5 g/dL (ref 3.5–5.2)
Alkaline Phosphatase: 102 U/L (ref 39–117)
BUN: 36 mg/dL — ABNORMAL HIGH (ref 6–23)
CO2: 24 mEq/L (ref 19–32)
Calcium: 9.4 mg/dL (ref 8.4–10.5)
Chloride: 104 mEq/L (ref 96–112)
Creatinine, Ser: 1.49 mg/dL — ABNORMAL HIGH (ref 0.50–1.35)
GFR calc Af Amer: 53 mL/min — ABNORMAL LOW (ref >90)
GFR calc non Af Amer: 46 mL/min — ABNORMAL LOW (ref >90)
Glucose, Bld: 96 mg/dL (ref 70–99)
Potassium: 4.2 mEq/L (ref 3.5–5.1)
Sodium: 139 mEq/L (ref 135–145)
Total Bilirubin: 1 mg/dL (ref 0.3–1.2)
Total Protein: 6.6 g/dL (ref 6.0–8.3)

## 2012-12-07 LAB — CARBOXYHEMOGLOBIN
Carboxyhemoglobin: 1.3 % (ref 0.5–1.5)
Methemoglobin: 1.2 % (ref 0.0–1.5)
O2 Saturation: 55.9 %
Total hemoglobin: 12.4 g/dL — ABNORMAL LOW (ref 13.5–18.0)

## 2012-12-07 LAB — PRO B NATRIURETIC PEPTIDE: Pro B Natriuretic peptide (BNP): 3310 pg/mL — ABNORMAL HIGH (ref 0–125)

## 2012-12-07 LAB — MAGNESIUM: Magnesium: 2.1 mg/dL (ref 1.5–2.5)

## 2012-12-07 MED ORDER — ISOSORBIDE MONONITRATE 15 MG HALF TABLET
15.0000 mg | ORAL_TABLET | Freq: Every day | ORAL | Status: DC
  Administered 2012-12-08 – 2012-12-09 (×2): 15 mg via ORAL
  Filled 2012-12-07 (×4): qty 1

## 2012-12-07 MED ORDER — ASPIRIN EC 325 MG PO TBEC
325.0000 mg | DELAYED_RELEASE_TABLET | Freq: Every day | ORAL | Status: DC
  Administered 2012-12-08 – 2012-12-09 (×2): 325 mg via ORAL
  Filled 2012-12-07 (×3): qty 1

## 2012-12-07 MED ORDER — HYDRALAZINE HCL 25 MG PO TABS
12.5000 mg | ORAL_TABLET | Freq: Three times a day (TID) | ORAL | Status: DC
  Administered 2012-12-07 – 2012-12-08 (×5): 12.5 mg via ORAL
  Administered 2012-12-09 (×2): via ORAL
  Filled 2012-12-07 (×11): qty 0.5

## 2012-12-07 MED ORDER — ENOXAPARIN SODIUM 30 MG/0.3ML ~~LOC~~ SOLN
30.0000 mg | Freq: Every day | SUBCUTANEOUS | Status: DC
  Administered 2012-12-07: 30 mg via SUBCUTANEOUS
  Filled 2012-12-07 (×2): qty 0.3

## 2012-12-07 MED ORDER — DIPHENHYDRAMINE HCL 25 MG PO CAPS
25.0000 mg | ORAL_CAPSULE | Freq: Every evening | ORAL | Status: DC | PRN
  Administered 2012-12-07 – 2012-12-08 (×2): 25 mg via ORAL
  Filled 2012-12-07 (×2): qty 1

## 2012-12-07 MED ORDER — ROSUVASTATIN CALCIUM 20 MG PO TABS
20.0000 mg | ORAL_TABLET | Freq: Every day | ORAL | Status: DC
  Administered 2012-12-08: 20 mg via ORAL
  Filled 2012-12-07 (×3): qty 1

## 2012-12-07 MED ORDER — SODIUM CHLORIDE 0.9 % IJ SOLN
10.0000 mL | Freq: Two times a day (BID) | INTRAMUSCULAR | Status: DC
  Administered 2012-12-07: 10 mL
  Administered 2012-12-07: 14.5 mL
  Administered 2012-12-09: 10 mL

## 2012-12-07 MED ORDER — SODIUM CHLORIDE 0.9 % IV SOLN
250.0000 mL | INTRAVENOUS | Status: DC | PRN
  Administered 2012-12-07: 250 mL via INTRAVENOUS

## 2012-12-07 MED ORDER — ATORVASTATIN CALCIUM 40 MG PO TABS: 40.0000 mg | ORAL_TABLET | Freq: Every day | ORAL | Status: DC

## 2012-12-07 MED ORDER — ZOLPIDEM TARTRATE 5 MG PO TABS: 5.0000 mg | ORAL_TABLET | Freq: Every evening | ORAL | Status: DC | PRN

## 2012-12-07 MED ORDER — EZETIMIBE 10 MG PO TABS
10.0000 mg | ORAL_TABLET | Freq: Every day | ORAL | Status: DC
  Administered 2012-12-08 – 2012-12-09 (×2): 10 mg via ORAL
  Filled 2012-12-07 (×3): qty 1

## 2012-12-07 MED ORDER — SODIUM CHLORIDE 0.9 % IJ SOLN: 10.0000 mL | INTRAMUSCULAR | Status: DC | PRN

## 2012-12-07 MED ORDER — SODIUM CHLORIDE 0.9 % IJ SOLN: 3.0000 mL | INTRAMUSCULAR | Status: DC | PRN

## 2012-12-07 MED ORDER — LEVOTHYROXINE SODIUM 150 MCG PO TABS
150.0000 ug | ORAL_TABLET | Freq: Every day | ORAL | Status: DC
  Filled 2012-12-07 (×3): qty 1

## 2012-12-07 MED ORDER — CLOPIDOGREL BISULFATE 75 MG PO TABS
75.0000 mg | ORAL_TABLET | Freq: Every day | ORAL | Status: DC
  Administered 2012-12-08 – 2012-12-09 (×2): 75 mg via ORAL
  Filled 2012-12-07 (×3): qty 1

## 2012-12-07 MED ORDER — ACETAMINOPHEN 325 MG PO TABS: 650.0000 mg | ORAL_TABLET | ORAL | Status: DC | PRN

## 2012-12-07 MED ORDER — DIPHENHYDRAMINE HCL 25 MG PO CAPS: 25.0000 mg | ORAL_CAPSULE | Freq: Every evening | ORAL | Status: DC | PRN

## 2012-12-07 MED ORDER — ONDANSETRON HCL 4 MG/2ML IJ SOLN: 4.0000 mg | Freq: Four times a day (QID) | INTRAMUSCULAR | Status: DC | PRN

## 2012-12-07 MED ORDER — SODIUM CHLORIDE 0.9 % IJ SOLN: 3.0000 mL | Freq: Two times a day (BID) | INTRAMUSCULAR | Status: DC

## 2012-12-07 MED ORDER — MILRINONE IN DEXTROSE 20 MG/100ML IV SOLN
0.2500 ug/kg/min | INTRAVENOUS | Status: DC
  Administered 2012-12-07 – 2012-12-09 (×3): 0.25 ug/kg/min via INTRAVENOUS
  Filled 2012-12-07 (×4): qty 100

## 2012-12-07 MED ORDER — CARVEDILOL 3.125 MG PO TABS
3.1250 mg | ORAL_TABLET | Freq: Two times a day (BID) | ORAL | Status: DC
  Administered 2012-12-07 – 2012-12-09 (×4): 3.125 mg via ORAL
  Filled 2012-12-07 (×6): qty 1

## 2012-12-07 NOTE — Progress Notes (Signed)
Peripherally Inserted Central Catheter/Midline Placement  The IV Nurse has discussed with the patient and/or persons authorized to consent for the patient, the purpose of this procedure and the potential benefits and risks involved with this procedure.  The benefits include less needle sticks, lab draws from the catheter and patient may be discharged home with the catheter.  Risks include, but not limited to, infection, bleeding, blood clot (thrombus formation), and puncture of an artery; nerve damage and irregular heat beat.  Alternatives to this procedure were also discussed.  PICC/Midline Placement Documentation  PICC / Midline Double Lumen [id number] PICC Right Basilic (Active)  Dressing Change Due 12/14/12 12/07/2012  3:00 PM       Jule Economy Horton 12/07/2012, 3:31 PM

## 2012-12-07 NOTE — H&P (Signed)
Advanced Heart Failure Team History and Physical Note    Primary Cardiologist:  Dr Oneill  Reason for Admission: Low Output Heart Failure   Weight Range: 159-162 pounds  HPI:   Johnny Oneill is a 72 year old male with a h/o CAD s/p stent to LCX 2004 with re-occlusion of LCX with lateral MI 2009 and repeat stenting with a Promus, total occlusion of RCA, chronic systolic heart failure EF 20-25%, ICD 2009, PVCs. Recently referred to HF Clinic for low output symptoms confirmed by cath.   RHC/LHC 12/01/12  RA 19/15, mean of 13 mm Hg  RV 65/10 mm Hg  PA 59/25, mean of 37 mm Hg  PCWP 26/24, mean of 20 mm Hg  LV 116/26 mm Hg  AO 125/88 mean of 106 mm Hg  Oxygen saturations:  PA 51%  AO 94%  Cardiac Output (Fick) 2.8 L/min , Thermo-3 L/min  Cardiac Index (Fick) 1.5 L/min/m2, Thermo 1.6 L/min/m2  Coronary angiography:  Coronary dominance: right  Left mainstem: less than 10% disease  Left anterior descending (LAD): Heavily calcified with 40% segmental disease in the proximal vessel. Diffuse irregularities.  Left circumflex (LCx): Widely patent stents in the proximal and mid vessel. Less than 20% disease.  Right coronary artery (RCA): Occluded proximally. Left to right collaterals to the RCA  11/14/12 CPX Peak VO2: 16.2 ml/kg/min predicted peak VO2: 58.4% VE/VCO2 slope: 46. OUES: 1.48 Peak RER: 1.15 Ventilatory Threshold: 10.2 % predicted peak VO2: 36.8%  He is admitted today from home due low output heart failure for initiation of Milrinone and to begin transplant/VAD work-up. Continues with NYHA IIIB-IV symptoms. Compliant with medications. He only takes lasix for weight of 163 pounds or greater. Weight at home has been 159-163 pounds.    PRE VAD TESTS  O negative   Blood Typing  Odered 12/07/12  Doppler PreCABG in EPIC (Carotid Dopplers and ABIs)  Odered 12/07/12  PFTs (Full with DLCO)  Odered 12/07/12   TSH, Hepatitis Panel, HIV  ______   Chest/Abdominal CT if previous chest or  abdominal surgery  ______   Venous Dopplers if suspect DVT  ______   Panorex (teeth questionable)  ______   Chest X-Ray (2 view)  Odered 12/07/12  Abdominal US for AAA screen if > 79 yrs old or presence of PAD  ______   Social Work and Palliative Consult (Pre-VAD workup in comments)  Odered 12/07/12 ECHO with EF documented <25%  Review of Systems: [y] = yes, [ ]  = no   General: Weight gain [ ] ; Weight loss [ ] ; Anorexia [ ] ; Fatigue [Y ]; Fever [ ] ; Chills [ ] ; Weakness [ ]   Cardiac: Chest pain/pressure [ ] ; Resting SOB [ ] ; Exertional SOB [Y ]; Orthopnea [Y ]; Pedal Edema [ ] ; Palpitations [ ] ; Syncope [ ] ; Presyncope [ ] ; Paroxysmal nocturnal dyspnea[ ]   Pulmonary: Cough [ Y]; Wheezing[ ] ; Hemoptysis[ ] ; Sputum [ ] ; Snoring [ ]   GI: Vomiting[ ] ; Dysphagia[ ] ; Melena[ ] ; Hematochezia [ ] ; Heartburn[ ] ; Abdominal pain [ ] ; Constipation [ ] ; Diarrhea [ ] ; BRBPR [ ]   GU: Hematuria[ ] ; Dysuria [ ] ; Nocturia[ ]   Vascular: Pain in legs with walking [ ] ; Pain in feet with lying flat [ ] ; Non-healing sores [ ] ; Stroke [ ] ; TIA [ ] ; Slurred speech [ ] ;  Neuro: Headaches[ ] ; Vertigo[ ] ; Seizures[ ] ; Paresthesias[ ] ;Blurred vision [ ] ; Diplopia [ ] ; Vision changes [ ]   Ortho/Skin: Arthritis [ ] ; Joint pain [Y ]; Muscle pain [ ] ;  Joint swelling [ ] ; Back Pain [ ] ; Rash [ ]   Psych: Depression[ ] ; Anxiety[ ]   Heme: Bleeding problems [ ] ; Clotting disorders [ ] ; Anemia [ ]   Endocrine: Diabetes [ ] ; Thyroid dysfunction[Y ]  Home Medications Prior to Admission medications   Medication Sig Start Date End Date Taking? Authorizing Provider  aspirin 325 MG tablet Take 325 mg by mouth daily.      Historical Provider, Johnny Oneill  carvedilol (COREG) 6.25 MG tablet Take 9.375 mg by mouth 2 (two) times daily with a meal. Take one and a half tablets (9.875 mg) two times a day 02/29/12   Johnny Junes, Johnny Oneill  clopidogrel (PLAVIX) 75 MG tablet Take 1 tablet (75 mg total) by mouth daily. 08/17/12   Johnny M Martinique, Johnny Oneill   Coenzyme Q10 (CO Q-10 PO) Take 1 capsule by mouth daily.    Historical Provider, Johnny Oneill  ezetimibe (ZETIA) 10 MG tablet Take 1 tablet (10 mg total) by mouth daily. 08/17/12   Johnny M [place], Johnny Oneill  hydrALAZINE (APRESOLINE) 25 MG tablet Take 0.5 tablets (12.5 mg total) by mouth 3 (three) times daily. 11/24/12   Johnny D Ninfa Meeker, Johnny Oneill  isosorbide mononitrate (IMDUR) 30 MG 24 hr tablet Take 0.5 tablets (15 mg total) by mouth daily. 11/24/12   Johnny D Clegg, Johnny Oneill  levothyroxine (SYNTHROID, LEVOTHROID) 150 MCG tablet Take 150 mcg by mouth daily.    Historical Provider, Johnny Oneill  nitroGLYCERIN (NITROSTAT) 0.4 MG SL tablet Place 0.4 mg under the tongue every 5 (five) minutes as needed. For chest pain    Historical Provider, Johnny Oneill  oxyCODONE-acetaminophen (PERCOCET) 5-325 MG per tablet Take 0.5-1 tablets by mouth 2 (two) times daily as needed. For pain.    Historical Provider, Johnny Oneill  rosuvastatin (CRESTOR) 20 MG tablet Take 1 tablet (20 mg total) by mouth daily. 08/17/12   Johnny M [place], Johnny Oneill    Past Medical History: Past Medical History  Diagnosis Date  . Ischemic cardiomyopathy     a. 10/09 Echo: Sev LV Dysfxn, inf/lat AK, Mod Johnny.  [name];  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 Hypertension   . PVC (premature ventricular contraction)   . Pacemaker   . CHF (congestive heart failure)   . Shortness of breath     Past Surgical History: Past Surgical History  Procedure Laterality Date  . Defibrillator  2009  . Total knee arthroplasty  11/27/11    left  . Insert / replace / remove pacemaker  2009  . Coronary angioplasty with stent placement  2004  . Coronary angioplasty with stent  placement  07/2008  . Knee arthroscopy w/ partial medial meniscectomy  12/2002    left  . Knee arthroscopy      bilaterally  . Total knee arthroplasty  11/27/2011    Procedure: TOTAL KNEE ARTHROPLASTY;  Surgeon: Johnny Rack., Johnny Oneill;  Location: [place];  Service: Orthopedics;  Laterality: Left;  left total knee arthroplasty    Family History: Family History  Problem Relation Age of Onset  .  Heart failure Father   . Stroke Father   . Coronary artery disease Mother   . Heart disease Mother   . Hyperlipidemia Sister     Social History: History   Social History  . Marital Status: Married    Spouse Name: N/A    Number of Children: 1  . Years of Education: N/A   Occupational History  . referee   . cleaning company    Social History Main Topics  . Smoking status: Never Smoker   . Smokeless tobacco: Never Used  . Alcohol Use: Yes     Comment: "occassionlly; last beer was 09/2011"  . Drug Use: No     Comment: "back in our younger days we smoked a little pot"  . Sexually Active: Not Currently   Other Topics Concern  . None   Social History Narrative  . None    Allergies:  Allergies  Allergen Reactions  . Diovan (Valsartan) Other (See Comments)    Hypotension at low dose, hyperkalemia  . Lipitor (Atorvastatin Calcium) Other (See Comments)    Muscle pain    Objective:    Vital Signs:   Temp:  [98.3 F (36.8 C)] 98.3 F (36.8 C) (04/09 1530) Pulse Rate:  [27-92] 92 (04/09 1530) Resp:  [17-20] 18 (04/09 1530) BP: (109-145)/(73-83) 132/75 mmHg (04/09 1530) SpO2:  [95 %-97 %] 95 % (04/09 1530) Weight:  [73.71 kg (162 lb 8 oz)-74.4 kg (164 lb 0.4 oz)] 73.71 kg (162 lb 8 oz) (04/09 1300)   Filed Weights   12/07/12 1100 12/07/12 1300  Weight: 74.4 kg (164 lb 0.4 oz) 73.71 kg (162 lb 8 oz)    Physical Exam: General:  Chronically ill appearing. No resp difficulty  HEENT: normal Neck: supple. JVP 5-6 . Carotids 2+ bilat; no bruits. No lymphadenopathy or thryomegaly  appreciated. Cor: PMI nondisplaced. Regular rate & rhythm. No rubs, gallops or murmurs. Lungs: clear Abdomen: soft, nontender, nondistended. No hepatosplenomegaly. No bruits or masses. Good bowel sounds. Extremities: no cyanosis, clubbing, rash, edema Neuro: alert & orientedx3, cranial nerves grossly intact. moves all 4 extremities w/o difficulty. Affect pleasant  Telemetry: SR PVCs  Labs: Basic Metabolic Panel:  Recent Labs Lab 12/07/12 1200  NA 139  K 4.2  CL 104  CO2 24  GLUCOSE 96  BUN 36*  CREATININE 1.49*  CALCIUM 9.4  MG 2.1    Liver Function Tests:  Recent Labs Lab 12/07/12 1200  AST 26  ALT 23  ALKPHOS 102  BILITOT 1.0  PROT 6.6  ALBUMIN 3.5   No results found for this basename: LIPASE, AMYLASE,  in the last 168 hours No results found for this basename: AMMONIA,  in the last 168 hours  CBC:  Recent Labs Lab 12/07/12 1200  WBC 6.4  NEUTROABS 4.5  HGB 12.9*  HCT 39.0  MCV 85.7  PLT 165    Cardiac Enzymes: No results found for this basename: CKTOTAL, CKMB, CKMBINDEX, TROPONINI,  in the last 168 hours  BNP: BNP (last 3 results)  Recent Labs  12/07/12 1200  PROBNP 3310.0*    CBG: No results found for this basename: GLUCAP,  in the last 168 hours  Coagulation Studies: No results found for this basename: LABPROT, INR,  in the last 72 hours  Other results: EKG:  Imaging: Dg Chest Port 1 View  12/07/2012  *RADIOLOGY REPORT*  Clinical Data: Confirm line placement  PORTABLE CHEST - 1 VIEW  Comparison: Prior chest x-ray 09/20/2012  Findings: Interval placement of  right upper extremity approach PICC.  The catheter tip projects over the superior cavoatrial junction.  There is moderate cardiomegaly which appears progressed compared to the prior study.  This may be partly due to differences in technique.  Pulmonary vascular congestion without overt edema. Left basilar atelectasis versus consolidation.  Small bilateral pleural effusions.  Left  subclavian approach cardiac rhythm maintenance device remains in unchanged position.  IMPRESSION:  1.  The tip of a newly placed right upper extremity approach PICC projects over the superior cavoatrial junction.  2.  Apparent progression of cardiomegaly compared to 09/20/2012 may be partly due to differences in technique.  3.  Pulmonary vascular congestion, small bilateral effusions and left lower lobe atelectasis versus infiltrate.   Original Report Authenticated By: Jacqulynn Cadet, M.D.         Assessment:   1. A/C systolic Heart Failure  3/ECHO EF 20-25%  2. Hypothyroidism  3. CAD s/p stent to LCX 2004 with re-occlusion of LCX with lateral MI 2009 and repeat stenting with a Promus, total occlusion of RCA 4. S/P ICD 2009 5. Hyperlipidemia 6. Arthritis S/P TKR 2013   Plan/Discussion:    He is admitted today from home with NYHA IIIB-IV symptoms due low output heart failure for initiation of Milrinone and ongoing VAD/tranplant work up. Volume status stable. Place PICC today. Follow CVP/CO-OX. Long talk with patient and wife about work-up and next steps. Discussed options for Tx at [facility] or [facility]. They prefer [facility].   Blood Type O negative  Obtain carotid doppler/ABIs, Abdominal US to rule out AAA. Check Hepatitis panel and TSH. Will need to obtain PFTs  tomorrow    Length of Stay: 0 Benay Spice 4:49 PM Advanced Heart Failure Team Pager [phone] (7a - 4p)  Please contact [facility] for night-coverage after hours (4p -7a ) on [email or web address]

## 2012-12-07 NOTE — Progress Notes (Signed)
*  PRELIMINARY RESULTS* Echocardiogram 2D Echocardiogram has been performed.  Leavy Cella 12/07/2012, 2:01 PM

## 2012-12-07 NOTE — Progress Notes (Addendum)
VASCULAR LAB PRELIMINARY  PRELIMINARY  PRELIMINARY  PRELIMINARY  Pre-op Cardiac Surgery  Carotid Findings:  Bilateral:  No evidence of hemodynamically significant internal carotid artery stenosis.   Vertebral artery flow is antegrade.    Asante Ritacco, RVT 12/07/2012 12:57 PM     Lower  Extremity Right Left  Brachial  Restricted Triphasic  119  Triphasic   Anterior Tibial 159  Biphasic  121  Biphasic   Posterior Tibial 170  Triphasic  157  Triphasic   Ankle/Brachial Indices 1.43 1.32    Joell Buerger, RVT 12/09/2012 1:12 PM

## 2012-12-08 ENCOUNTER — Inpatient Hospital Stay (HOSPITAL_COMMUNITY): Payer: Medicare Other

## 2012-12-08 DIAGNOSIS — I4729 Other ventricular tachycardia: Secondary | ICD-10-CM

## 2012-12-08 DIAGNOSIS — I472 Ventricular tachycardia, unspecified: Secondary | ICD-10-CM

## 2012-12-08 DIAGNOSIS — I5023 Acute on chronic systolic (congestive) heart failure: Principal | ICD-10-CM

## 2012-12-08 DIAGNOSIS — I4949 Other premature depolarization: Secondary | ICD-10-CM

## 2012-12-08 DIAGNOSIS — I493 Ventricular premature depolarization: Secondary | ICD-10-CM

## 2012-12-08 LAB — BASIC METABOLIC PANEL
BUN: 24 mg/dL — ABNORMAL HIGH (ref 6–23)
CO2: 24 mEq/L (ref 19–32)
Calcium: 8.7 mg/dL (ref 8.4–10.5)
Chloride: 107 mEq/L (ref 96–112)
Creatinine, Ser: 1.36 mg/dL — ABNORMAL HIGH (ref 0.50–1.35)
GFR calc Af Amer: 60 mL/min — ABNORMAL LOW (ref >90)
GFR calc non Af Amer: 52 mL/min — ABNORMAL LOW (ref >90)
Glucose, Bld: 113 mg/dL — ABNORMAL HIGH (ref 70–99)
Potassium: 3.3 mEq/L — ABNORMAL LOW (ref 3.5–5.1)
Sodium: 139 mEq/L (ref 135–145)

## 2012-12-08 LAB — HEPATITIS PANEL, ACUTE
HCV Ab: NEGATIVE
Hep A IgM: NEGATIVE
Hep B C IgM: NEGATIVE
Hepatitis B Surface Ag: NEGATIVE

## 2012-12-08 LAB — T4, FREE: Free T4: 1.34 ng/dL (ref 0.80–1.80)

## 2012-12-08 LAB — CARBOXYHEMOGLOBIN
Carboxyhemoglobin: 1.5 % (ref 0.5–1.5)
Methemoglobin: 1.1 % (ref 0.0–1.5)
O2 Saturation: 65 %
Total hemoglobin: 12.2 g/dL — ABNORMAL LOW (ref 13.5–18.0)

## 2012-12-08 MED ORDER — AMIODARONE HCL IN DEXTROSE 360-4.14 MG/200ML-% IV SOLN
INTRAVENOUS | Status: AC
  Administered 2012-12-08: 60 mg/h via INTRAVENOUS
  Filled 2012-12-08: qty 400

## 2012-12-08 MED ORDER — AMIODARONE LOAD VIA INFUSION
150.0000 mg | Freq: Once | INTRAVENOUS | Status: AC
  Administered 2012-12-08: 150 mg via INTRAVENOUS
  Filled 2012-12-08: qty 83.34

## 2012-12-08 MED ORDER — ENOXAPARIN SODIUM 40 MG/0.4ML ~~LOC~~ SOLN
40.0000 mg | Freq: Every day | SUBCUTANEOUS | Status: DC
  Administered 2012-12-08 – 2012-12-09 (×2): 40 mg via SUBCUTANEOUS
  Filled 2012-12-08 (×2): qty 0.4

## 2012-12-08 MED ORDER — LEVOTHYROXINE SODIUM 150 MCG PO TABS
150.0000 ug | ORAL_TABLET | Freq: Every day | ORAL | Status: DC
Start: 2012-12-09 — End: 2012-12-09
  Administered 2012-12-09: 150 ug via ORAL
  Filled 2012-12-08 (×2): qty 1

## 2012-12-08 MED ORDER — LEVOTHYROXINE SODIUM 175 MCG PO TABS
175.0000 ug | ORAL_TABLET | Freq: Every day | ORAL | Status: DC
  Administered 2012-12-08: 175 ug via ORAL
  Filled 2012-12-08 (×2): qty 1

## 2012-12-08 MED ORDER — FUROSEMIDE 10 MG/ML IJ SOLN
40.0000 mg | Freq: Two times a day (BID) | INTRAMUSCULAR | Status: DC
  Administered 2012-12-08 – 2012-12-09 (×2): 40 mg via INTRAVENOUS
  Filled 2012-12-08 (×3): qty 4

## 2012-12-08 MED ORDER — AMIODARONE HCL IN DEXTROSE 360-4.14 MG/200ML-% IV SOLN
30.0000 mg/h | INTRAVENOUS | Status: DC
  Administered 2012-12-09: 30 mg/h via INTRAVENOUS
  Filled 2012-12-08 (×3): qty 200

## 2012-12-08 MED ORDER — POTASSIUM CHLORIDE CRYS ER 20 MEQ PO TBCR
40.0000 meq | EXTENDED_RELEASE_TABLET | Freq: Once | ORAL | Status: AC
Start: 2012-12-08 — End: 2012-12-08
  Administered 2012-12-08: 40 meq via ORAL
  Filled 2012-12-08: qty 2

## 2012-12-08 MED ORDER — AMIODARONE HCL IN DEXTROSE 360-4.14 MG/200ML-% IV SOLN
60.0000 mg/h | INTRAVENOUS | Status: AC
  Administered 2012-12-08: 60 mg/h via INTRAVENOUS

## 2012-12-08 MED ORDER — POTASSIUM CHLORIDE CRYS ER 20 MEQ PO TBCR
40.0000 meq | EXTENDED_RELEASE_TABLET | Freq: Once | ORAL | Status: AC
  Administered 2012-12-08: 40 meq via ORAL
  Filled 2012-12-08: qty 2

## 2012-12-08 NOTE — Progress Notes (Signed)
[facility]  Patient Status: Johnny Oneill is a new admission to [facility] related to this hospital admission,  [facility] is providing the following services: [facility] nursing and [facility].  [facility] hospital coordinators will work with pt and wife regarding POC for home Milrinone therapy in preparation for discharge home when deemed appropriate by pt physician team.  If patient discharges after hours, please call [phone].   [name] 12/08/2012, 9:40 PM

## 2012-12-08 NOTE — Care Management Note (Signed)
    Page 1 of 2   12/08/2012     1:47:25 PM   CARE MANAGEMENT NOTE 12/08/2012  Patient:  Johnny Oneill, Johnny Oneill   Account Number:  [id number]  Date Initiated:  12/07/2012  Documentation initiated by:  Elissa Hefty  Subjective/Objective Assessment:   adm w heart failure     Action/Plan:   lives w wife, pcp dr Jenny Reichmann griffin, hx of [facility].   Anticipated DC Date:  12/09/2012   Anticipated DC Plan:  [place]  CM consult      [facility] Choice  HOME HEALTH   Choice offered to / List presented to:  C-1 Patient   DME arranged  IV PUMP/EQUIPMENT      DME agency  [place] arranged  HH-1 RN  [place].   Status of service:   Medicare Important Message given?   (If response is "NO", the following Medicare IM given date fields will be blank) Date Medicare IM given:   Date Additional Medicare IM given:    Discharge Disposition:  [place]  Per UR Regulation:  Reviewed for med. necessity/level of care/duration of stay  If discussed at Long Length of Stay Meetings, dates discussed:    Comments:  4/10 1346 debbie Dywane Peruski rn,bsn spoke w pt and wife. gave them [facility]hc agency list. they have used [facility] in past and would like them again for home milrinone. ref made to pam chandler at [facility] for home milrinone. poss dc on 4-11.  4/9 1145a debbie Minyon Billiter rn,bsn

## 2012-12-08 NOTE — Progress Notes (Signed)
  Amiodarone Drug - Drug Interaction Consult Note  Recommendations:  Amiodarone is metabolized by the cytochrome P450 system and therefore has the potential to cause many drug interactions. Amiodarone has an average plasma half-life of 50 days (range 20 to 100 days).   There is potential for drug interactions to occur several weeks or months after stopping treatment and the onset of drug interactions may be slow after initiating amiodarone.   [x]  Statins: Increased risk of myopathy. Simvastatin- restrict dose to 20mg  daily. Other statins: counsel patients to report any muscle pain or weakness immediately.  []  Anticoagulants: Amiodarone can increase anticoagulant effect. Consider warfarin dose reduction. Patients should be monitored closely and the dose of anticoagulant altered accordingly, remembering that amiodarone levels take several weeks to stabilize.  []  Antiepileptics: Amiodarone can increase plasma concentration of phenytoin, the dose should be reduced. Note that small changes in phenytoin dose can result in large changes in levels. Monitor patient and counsel on signs of toxicity.  [x]  Beta blockers: increased risk of bradycardia, AV block and myocardial depression. Sotalol - avoid concomitant use.  []   Calcium channel blockers (diltiazem and verapamil): increased risk of bradycardia, AV block and myocardial depression.  []   Cyclosporine: Amiodarone increases levels of cyclosporine. Reduced dose of cyclosporine is recommended.  []  Digoxin dose should be halved when amiodarone is started.  [x]  Diuretics: increased risk of cardiotoxicity if hypokalemia occurs.  []  Oral hypoglycemic agents (glyburide, glipizide, glimepiride): increased risk of hypoglycemia. Patient's glucose levels should be monitored closely when initiating amiodarone therapy.   [x]  Drugs that prolong the QT interval:  Torsades de pointes risk may be increased with concurrent use - avoid if possible.  Monitor QTc,  also keep magnesium/potassium WNL if concurrent therapy can't be avoided. [name] Antibiotics: e.g. fluoroquinolones, erythromycin. . Antiarrhythmics: e.g. quinidine, procainamide, disopyramide, sotalol. . Antipsychotics: e.g. phenothiazines, haloperidol.  . Lithium, tricyclic antidepressants, and methadone.  Patient is on Ondansetron, which may prolong the QT interval.  It is ordered PRN and have included for completeness.  As of now, no doses have been given.  Thank You,   Gracy Bruins, PharmD Clinical Pharmacist [facility]

## 2012-12-08 NOTE — Progress Notes (Signed)
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 Recently referred to HF Clinic for low output symptoms confirmed by cath.   RHC/LHC 12/01/12  RA 19/15, mean of 13 mm Hg  RV 65/10 mm Hg  PA 59/25, mean of 37 mm Hg  PCWP 26/24, mean of 20 mm Hg  LV 116/26 mm Hg  AO 125/88 mean of 106 mm Hg  Oxygen saturations:  PA 51%  AO 94%  Cardiac Output (Fick) 2.8 L/min , Thermo-3 L/min  Cardiac Index (Fick) 1.5 L/min/m2, Thermo 1.6 L/min/m2  Coronary angiography:  Coronary dominance: right  Left mainstem: less than 10% disease  Left anterior descending (LAD): Heavily calcified with 40% segmental disease in the proximal vessel. Diffuse irregularities.  Left circumflex (LCx): Widely patent stents in the proximal and mid vessel. Less than 20% disease.  Right coronary artery (RCA): Occluded proximally. Left to right collaterals to the RCA  11/14/12 CPX Peak VO2: 16.2 ml/kg/min predicted peak VO2: 58.4% VE/VCO2 slope: 46. OUES: 1.48 Peak RER: 1.15 Ventilatory Threshold: 10.2 % predicted peak VO2: 36.8%  He was admitted yesterday from home due low output heart failure for initiation of Milrinone and to begin transplant/VAD work-up. Continues with NYHA IIIB-IV symptoms. Compliant with medications. He only takes lasix for weight of 163 pounds or greater. Weight at home has been 159-163 pounds.   Yesterday he was started on Milrinone at 0.25 mcg. Denies SOB/PND/Orthopnea. Feels better.   CO-OX 65. CVP 14-18  Cardiac Output 4.49 Cardiac Index 2.41 on Milrinone 0.25 mcg   PRE VAD TESTS  O negative Blood Typing  Odered 12/07/12 Doppler PreCABG in EPIC - Carotid ok and ABIs pending  Odered 12/07/12 PFTs (Full with DLCO)  Odered 12/07/12 TSH, Hepatitis Panel, HIV  ______  Chest/Abdominal CT if previous chest or abdominal surgery  NA  Venous Dopplers if suspect DVT  ______ Panorex (teeth questionable)  ______ Chest X-Ray (2 view)  Odered 12/07/12 Abdominal US for AAA screen if > 71 yrs old or presence of PAD  ______ Social Work and Palliative Consult (Pre-VAD workup in comments)  Odered 12/07/12 ECHO with EF documented <25%        Objective:   Weight Range:  Vital Signs:   Temp:  [97.7 F (36.5 C)-98.2 F (36.8 C)] 97.7 F (36.5 C) (04/10 1600) Resp:  [16-18] 18 (04/10 1600) BP: (109-154)/(50-107) 125/77 mmHg (04/10 1600) SpO2:  [94 %-96 %] 95 % (04/10 1600) Weight:  [72.4 kg (159 lb 9.8 oz)] 72.4 kg (159 lb 9.8 oz) (04/10 0500)    Weight change: Filed Weights   12/07/12 1100 12/07/12 1300 12/08/12 0500  Weight: 74.4 kg (164 lb 0.4 oz) 73.71 kg (162 lb 8 oz) 72.4 kg (159 lb 9.8 oz)    Intake/Output:   Intake/Output Summary (Last 24 hours) at 12/08/12 1734 Last data filed at 12/08/12 0000  Gross per 24 hour  Intake  380.7 ml  Output    980 ml  Net -599.3 ml     Physical Exam: CVP personally check 7 General:  Well appearing. No resp difficulty HEENT: normal Neck: supple. JVP jaw . Carotids 2+ bilat; no bruits. No lymphadenopathy or thryomegaly appreciated. Cor: PMI nondisplaced. Irregular rate & rhythm. No rubs, gallops  or murmurs. Lungs: clear Abdomen: soft, nontender, nondistended. No hepatosplenomegaly. No bruits or masses. Good bowel sounds. Extremities: no cyanosis, clubbing, rash, tr edema RUE PICC Neuro: alert & orientedx3, cranial nerves grossly intact. moves all 4 extremities w/o difficulty. Affect pleasant  Telemetry: SR very frequent PVCs with bigeminy and NSVT  Labs: Basic Metabolic Panel:  Recent Labs Lab 12/07/12 1200 12/08/12 0500  NA 139 139  K 4.2 3.3*  CL 104 107  CO2 24 24  GLUCOSE 96 113*  BUN 36* 24*  CREATININE 1.49* 1.36*  CALCIUM 9.4 8.7  MG 2.1  --     Liver Function Tests:  Recent Labs Lab  12/07/12 1200  AST 26  ALT 23  ALKPHOS 102  BILITOT 1.0  PROT 6.6  ALBUMIN 3.5   No results found for this basename: LIPASE, AMYLASE,  in the last 168 hours No results found for this basename: AMMONIA,  in the last 168 hours  CBC:  Recent Labs Lab 12/07/12 1200  WBC 6.4  NEUTROABS 4.5  HGB 12.9*  HCT 39.0  MCV 85.7  PLT 165    Cardiac Enzymes: No results found for this basename: CKTOTAL, CKMB, CKMBINDEX, TROPONINI,  in the last 168 hours  BNP: BNP (last 3 results)  Recent Labs  12/07/12 1200  PROBNP 3310.0*     Imaging: US Aorta  12/08/2012  *RADIOLOGY REPORT*  Clinical Data:  Screening, transplant evaluation  ULTRASOUND OF ABDOMINAL AORTA  Technique:  Ultrasound examination of the abdominal aorta was performed to evaluate for abdominal aortic aneurysm.  Comparison: CT 09/24/2010  Abdominal Aorta:  No aneurysm identified. Patchy atheromatous plaque in the distal segment.        Maximum AP diameter:  2.2 cm.       Maximum TRV diameter:  2.2 cm.  IMPRESSION: No abdominal aortic aneurysm identified.   Original Report Authenticated By: D. Wallace Going, MD    Dg Chest Port 1 View  12/07/2012  *RADIOLOGY REPORT*  Clinical Data: Confirm line placement  PORTABLE CHEST - 1 VIEW  Comparison: Prior chest x-ray 09/20/2012  Findings: Interval placement of right upper extremity approach PICC.  The catheter tip projects over the superior cavoatrial junction.  There is moderate cardiomegaly which appears progressed compared to the prior study.  This may be partly due to differences in technique.  Pulmonary vascular congestion without overt edema. Left basilar atelectasis versus consolidation.  Small bilateral pleural effusions.  Left subclavian approach cardiac rhythm maintenance device remains in unchanged position.  IMPRESSION:  1.  The tip of a newly placed right upper extremity approach PICC projects over the superior cavoatrial junction.  2.  Apparent progression of cardiomegaly  compared to 09/20/2012 may be partly due to differences in technique.  3.  Pulmonary vascular congestion, small bilateral effusions and left lower lobe atelectasis versus infiltrate.   Original Report Authenticated By: Jacqulynn Cadet, M.D.      Medications:     Scheduled Medications: . amiodarone (NEXTERONE PREMIX) 360 mg/200 mL dextrose      . aspirin EC  325 mg Oral Daily  . carvedilol  3.125 mg Oral BID WC  . clopidogrel  75 mg Oral Q breakfast  . enoxaparin (LOVENOX) injection  40 mg Subcutaneous Daily  . ezetimibe  10 mg Oral Daily  . hydrALAZINE  12.5 mg Oral Q8H  . isosorbide mononitrate  15 mg Oral Daily  . levothyroxine  175 mcg Oral QAC breakfast  . rosuvastatin  20 mg Oral q1800  .  sodium chloride  10-40 mL Intracatheter Q12H  . sodium chloride  3 mL Intravenous Q12H    Infusions: . milrinone 0.251 mcg/kg/min (12/08/12 0000)    PRN Medications: sodium chloride, acetaminophen, diphenhydrAMINE, ondansetron (ZOFRAN) IV, sodium chloride, sodium chloride   Assessment:   1. A/C systolic Heart Failure 123XX123 ECHO EF 20-25%  2. Hypothyroidism  3. CAD s/p stent to LCX 2004 with re-occlusion of LCX with lateral MI 2009 and repeat stenting with a Promus, total occlusion of RCA  4. S/P ICD 2009  5. Hyperlipidemia  6. Arthritis S/P TKR 2013 7. Hypokalemia   Plan/Discussion:     Symptomatically much improved on milrinone. Though he has had a significant increase in PVC burden. Volume up. Will give IV lasix.  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 I looked back at his previous [facility] reports but could not find a reading of PVC burden. Will start amiodarone. If PVC burden was very high prior to milrinone initiation, may be reasonable to wait a few weeks to see if EF improves with PVC suppression. Will supp K+. Check mag.  TSH is up. Will check free T4. May need to increase synthroid dose. Will leave at 150 for now.    Once testing complete will send results to [facility] for transplant work-up. Will attempt to bridge him to transplant with milrinone but given blood type O may need bridge VAD.  [facility] to see today to arrange home milrinone.   Hepatitis Panel pending PFTs pending  Carotid Dopplers -Ok    Length of Stay: 1   Glori Bickers 12/08/2012, 5:34 PM  Advanced Heart Failure Team Pager [phone] (7a - 4p)  Please contact [facility] for night-coverage after hours (4p -7a ) on [email or web address]

## 2012-12-09 ENCOUNTER — Encounter (HOSPITAL_COMMUNITY): Payer: Self-pay | Admitting: Internal Medicine

## 2012-12-09 LAB — CARBOXYHEMOGLOBIN
Carboxyhemoglobin: 1.2 % (ref 0.5–1.5)
Methemoglobin: 0.7 % (ref 0.0–1.5)
O2 Saturation: 71.1 %
Total hemoglobin: 12.1 g/dL — ABNORMAL LOW (ref 13.5–18.0)

## 2012-12-09 LAB — BASIC METABOLIC PANEL
BUN: 24 mg/dL — ABNORMAL HIGH (ref 6–23)
CO2: 27 mEq/L (ref 19–32)
Calcium: 8.7 mg/dL (ref 8.4–10.5)
Chloride: 107 mEq/L (ref 96–112)
Creatinine, Ser: 1.5 mg/dL — ABNORMAL HIGH (ref 0.50–1.35)
GFR calc Af Amer: 53 mL/min — ABNORMAL LOW (ref >90)
GFR calc non Af Amer: 46 mL/min — ABNORMAL LOW (ref >90)
Glucose, Bld: 92 mg/dL (ref 70–99)
Potassium: 3.8 mEq/L (ref 3.5–5.1)
Sodium: 141 mEq/L (ref 135–145)

## 2012-12-09 LAB — HEPATITIS PANEL, ACUTE
HCV Ab: NEGATIVE
Hep A IgM: NEGATIVE
Hep B C IgM: NEGATIVE
Hepatitis B Surface Ag: NEGATIVE

## 2012-12-09 LAB — MAGNESIUM: Magnesium: 1.9 mg/dL (ref 1.5–2.5)

## 2012-12-09 MED ORDER — AMIODARONE HCL 200 MG PO TABS: 200.0000 mg | ORAL_TABLET | Freq: Two times a day (BID) | ORAL | Status: DC

## 2012-12-09 MED ORDER — CARVEDILOL 3.125 MG PO TABS: 3.1250 mg | ORAL_TABLET | Freq: Two times a day (BID) | ORAL | Status: DC

## 2012-12-09 MED ORDER — MAGNESIUM SULFATE 40 MG/ML IJ SOLN
2.0000 g | Freq: Once | INTRAMUSCULAR | Status: AC
  Administered 2012-12-09: 2 g via INTRAVENOUS
  Filled 2012-12-09: qty 50

## 2012-12-09 MED ORDER — MILRINONE IN DEXTROSE 20 MG/100ML IV SOLN: 0.2500 ug/kg/min | INTRAVENOUS | Status: DC

## 2012-12-09 NOTE — Progress Notes (Signed)
Patient being discharged w/wife via wheelchair.  Patient ambulated without difficulty or c/o SOB.  Discharge instructions given to patient and wife.  All questions addressed and answered.  Dr. Haroldine Laws ordered patient to continue Lasix 40 mg po daily, patient updated. Left PIV discontinued w/pressure dressing applied to site. Patient awake, alert and oriented at time of discharge.

## 2012-12-09 NOTE — Discharge Summary (Signed)
Advanced Heart Failure Team  Discharge Summary   Patient ID: Johnny Oneill MRN: [id number], DOB/AGE: 04/29/1941 72 y.o. Admit date: 12/07/2012 D/C date:     12/09/2012   Primary Discharge Diagnoses:   1. A/C systolic Heart Failure 123XX123 ECHO EF 20-25% -> cardiogenic shock 2. Hypothyroidism  3. CAD s/p stent to LCX 2004 with re-occlusion of LCX with lateral MI 2009 and repeat stenting with a Promus, total occlusion of RCA  4. S/P ICD 2009  5. Hyperlipidemia  6. Arthritis S/P TKR 2013  7. Hypokalemia 8. Blood Type O negative 9. 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   RHC/LHC 12/01/12  RA 19/15, mean of 13 mm Hg  RV 65/10 mm Hg  PA 59/25, mean of 37 mm Hg  PCWP 26/24, mean of 20 mm Hg  LV 116/26 mm Hg  AO 125/88 mean of 106 mm Hg  Oxygen saturations:  PA 51%  AO 94%  Cardiac Output (Fick) 2.8 L/min , Thermo-3 L/min  Cardiac Index (Fick) 1.5 L/min/m2, Thermo 1.6 L/min/m2  Coronary angiography:  Coronary dominance: right  Left mainstem: less than 10% disease  Left anterior descending (LAD): Heavily calcified with 40% segmental disease in the proximal vessel. Diffuse irregularities.  Left circumflex (LCx): Widely patent stents in the proximal and mid vessel. Less than 20% disease.  Right coronary artery (RCA): Occluded proximally. Left to right collaterals to the RCA   11/14/12 CPX Peak VO2: 16.2 ml/kg/min predicted peak VO2: 58.4% VE/VCO2 slope: 46. OUES: 1.48 Peak RER: 1.15 Ventilatory Threshold: 10.2 % predicted peak VO2: 36.8%  He was admitted from from home due with low output heart failure for initiation of Milrinone and to begin transplant/VAD work-up. He presented with NYHA IIIB-IV symptoms. Milrinone initiated at 0.25 mcg. Cardiac index improved to 2.41.   PICC placed 12/07/12.  Diuresed with IV lasix and transitioned to po lasix 40 mg daily. Diuresed 7 pounds.  Discharge weight 157 pounds.   Device interrogated due to frequent PVCs- bigeminy. His PVCs burden has been in 3-5% range historically. Most recently up to 10%. I do not feel cardiomyopathy is PVC mediated.  Amiodarone drip started and later he was transitioned to Amiodarone 200 mg bid.   While hospitalized he met with the VAD coordinator to discuss work up for possible VAD and heart transplant.  Carotid dopplers and ABIs completed. CT of abdomen no evidence of aneurysm.   [facility] will follow for home Milrinone at 0.25 mcg via PICC and weekly BMET. He will be followed closely in the HF clinic with follow up December 14, 2012 at 14:00.    Discharge Weight Range: Discharge Weight 157 pounds.  Discharge Vitals: Blood pressure 123/77, pulse 91, temperature 97.8 F (36.6 C), temperature source Oral, resp. rate 17, height 5\' 7"  (1.702 m), weight 71.4 kg (157 lb 6.5 oz), SpO2 98.00%.  Labs: Lab Results  Component Value Date   WBC 6.4 12/07/2012   HGB 12.9* 12/07/2012   HCT 39.0 12/07/2012   MCV 85.7 12/07/2012   PLT 165 12/07/2012    Recent Labs Lab 12/07/12 1200  12/09/12 0500  NA 139  < > 141  K 4.2  < > 3.8  CL 104  < > 107  CO2 24  < >  27  BUN 36*  < > 24*  CREATININE 1.49*  < > 1.50*  CALCIUM 9.4  < > 8.7  PROT 6.6  --   --   BILITOT 1.0  --   --   ALKPHOS 102  --   --   ALT 23  --   --   AST 26  --   --   GLUCOSE 96  < > 92  < > = values in this interval not displayed. Lab Results  Component Value Date   CHOL 99 06/01/2011   HDL 44.50 06/01/2011   LDLCALC 39 06/01/2011   TRIG 78.0 06/01/2011   BNP (last 3 results)  Recent Labs  12/07/12 1200  PROBNP 3310.0*    Diagnostic Studies/Procedures   US Aorta  12/08/2012  *RADIOLOGY REPORT*  Clinical Data:  Screening, transplant evaluation  ULTRASOUND OF ABDOMINAL AORTA  Technique:  Ultrasound examination of the abdominal aorta was  performed to evaluate for abdominal aortic aneurysm.  Comparison: CT 09/24/2010  Abdominal Aorta:  No aneurysm identified. Patchy atheromatous plaque in the distal segment.        Maximum AP diameter:  2.2 cm.       Maximum TRV diameter:  2.2 cm.  IMPRESSION: No abdominal aortic aneurysm identified.   Original Report Authenticated By: D. Wallace Going, MD    Dg Chest Port 1 View  12/07/2012  *RADIOLOGY REPORT*  Clinical Data: Confirm line placement  PORTABLE CHEST - 1 VIEW  Comparison: Prior chest x-ray 09/20/2012  Findings: Interval placement of right upper extremity approach PICC.  The catheter tip projects over the superior cavoatrial junction.  There is moderate cardiomegaly which appears progressed compared to the prior study.  This may be partly due to differences in technique.  Pulmonary vascular congestion without overt edema. Left basilar atelectasis versus consolidation.  Small bilateral pleural effusions.  Left subclavian approach cardiac rhythm maintenance device remains in unchanged position.  IMPRESSION:  1.  The tip of a newly placed right upper extremity approach PICC projects over the superior cavoatrial junction.  2.  Apparent progression of cardiomegaly compared to 09/20/2012 may be partly due to differences in technique.  3.  Pulmonary vascular congestion, small bilateral effusions and left lower lobe atelectasis versus infiltrate.   Original Report Authenticated By: Jacqulynn Cadet, M.D.     Discharge Medications     Medication List    TAKE these medications       amiodarone 200 MG tablet  Commonly known as:  PACERONE  Take 1 tablet (200 mg total) by mouth 2 (two) times daily.     aspirin 325 MG tablet  Take 325 mg by mouth daily.     carvedilol 3.125 MG tablet  Commonly known as:  COREG  Take 1 tablet (3.125 mg total) by mouth 2 (two) times daily with a meal.     clopidogrel 75 MG tablet  Commonly known as:  PLAVIX  Take 1 tablet (75 mg total) by mouth daily.     CO  Q-10 PO  Take 1 capsule by mouth daily.     ezetimibe 10 MG tablet  Commonly known as:  ZETIA  Take 1 tablet (10 mg total) by mouth daily.     hydrALAZINE 25 MG tablet  Commonly known as:  APRESOLINE  Take 0.5 tablets (12.5 mg total) by mouth 3 (three) times daily.     isosorbide mononitrate 30 MG 24 hr tablet  Commonly known as:  IMDUR  Take 0.5 tablets (  15 mg total) by mouth daily.     levothyroxine 150 MCG tablet  Commonly known as:  SYNTHROID, LEVOTHROID  Take 150 mcg by mouth daily.     milrinone 20 MG/100ML Soln infusion  Commonly known as:  PRIMACOR  Inject 18.6 mcg/min into the vein continuous.     nitroGLYCERIN 0.4 MG SL tablet  Commonly known as:  NITROSTAT  Place 0.4 mg under the tongue every 5 (five) minutes as needed. For chest pain     oxyCODONE-acetaminophen 5-325 MG per tablet  Commonly known as:  PERCOCET/ROXICET  Take 0.5-1 tablets by mouth 2 (two) times daily as needed. For pain.     rosuvastatin 20 MG tablet  Commonly known as:  CRESTOR  Take 1 tablet (20 mg total) by mouth daily.        Disposition   The patient will be discharged in stable condition to home. Discharge Orders   Future Appointments Provider Department Dept Phone   12/26/2012 1:30 PM [place] [phone]   02/10/2013 11:15 AM Peter M Martinique, MD [facility] [place]) [phone]   Future Orders Complete By Expires     ([place]) Call MD:  Anytime you have any of the following symptoms: 1) 3 pound weight gain in 24 hours or 5 pounds in 1 week 2) shortness of breath, with or without a dry hacking cough 3) swelling in the hands, feet or stomach 4) if you have to sleep on extra pillows at night in order to breathe.  As directed     Contraindication to ACEI at discharge  As directed     Comments:      Renal fialure    Diet - low sodium heart healthy  As directed     Heart Failure patients record  your daily weight using the same scale at the same time of day  As directed     Increase activity slowly  As directed       Follow-up Information   Follow up with Glori Bickers, MD On 12/09/2012. (at 2:00 Garage Code 0001)    Contact information:   [place] [address] [phone]         Duration of Discharge Encounter: Greater than 35 minutes   [employer]  12/09/2012, 1:43 PM

## 2012-12-09 NOTE — Progress Notes (Addendum)
   ELECTROPHYSIOLOGY DEVICE INTERROGATION    Device Manufacturer: Medtronic Model Number: [id number] DOI: 07-03-2008 Implanting physician: Ilda Foil  Device following physician: Allred  Battery Voltage: 3.02  Charge Time: 10.4       RA Lead (5076) RV Lead (6947)    Amplitude 2.5 4.0  Impedence [id number]  Threshold 0.5V @ 0.88msec 1.0V @ 0.59msec  HV impedence  36/41   Episodes:  High Atrial rates: 2,119 mode switch episodes since 11-27-2011.  The EGM's that I have available are FFRW oversensing resulting in inappropriate mode switching.  The longest mode switch episode recorded was in the 1-4 hour range; however, I do not have an EGM nor is this episode on the episode log.  There have only been 3 episodes logged of >1 hour.  V rate during mode switch histograms would suggest the majority are inappropriate mode switches. There are some V rates in the 140 range-- this supports that the longer recorded episodes may be real.   High Ventricular rates: 5 NSVT episodes  Programmed parameters:  Loletha Grayer parameters:  Mode: DDD Lower Rate: 60 Upper rate: 130 PAV: 300   SAV: 300  Tachy parameters:  VF zone:   Rate: 200 Therapies: ATP during charging, shocks    Changes this session: atrial sensitivity changed from 0.39mV to 0.38mV to eliminate FFRW oversensing on atrial channel and hopefully decrease incidence of inappropriate mode switch recordings.  Presenting Rhythm: sinus rhythm with PVC's  PVC counter: 325.8/hour (increased from 166.8/hour at last check 11-26-2012)  Review of atrial histograms would suggest that about 10% of beats are PVC's   Chanetta Marshall, RN, BSN, CCDS 12/09/2012 6:45 AM

## 2012-12-09 NOTE — Progress Notes (Signed)
Per Dr Haroldine Laws pt's records faxed to [facility] Transplant team for eval at [phone]

## 2012-12-09 NOTE — Progress Notes (Signed)
Advanced Heart Failure Rounding Note   Subjective:    Johnny Oneill is a 72 year old male with a h/o CAD s/p stent to LCX 2004 with re-occlusion of LCX with lateral MI 2009 and repeat stenting with a Promus, total occlusion of RCA, chronic systolic heart failure EF 20-25%, ICD 2009, PVCs. Recently referred to [facility] for low output symptoms confirmed by cath.   RHC/LHC 12/01/12  RA 19/15, mean of 13 mm Hg  RV 65/10 mm Hg  PA 59/25, mean of 37 mm Hg  PCWP 26/24, mean of 20 mm Hg  LV 116/26 mm Hg  AO 125/88 mean of 106 mm Hg  Oxygen saturations:  PA 51%  AO 94%  Cardiac Output (Fick) 2.8 L/min , Thermo-3 L/min  Cardiac Index (Fick) 1.5 L/min/m2, Thermo 1.6 L/min/m2  Coronary angiography:  Coronary dominance: right  Left mainstem: less than 10% disease  Left anterior descending (LAD): Heavily calcified with 40% segmental disease in the proximal vessel. Diffuse irregularities.  Left circumflex (LCx): Widely patent stents in the proximal and mid vessel. Less than 20% disease.  Right coronary artery (RCA): Occluded proximally. Left to right collaterals to the RCA  11/14/12 CPX Peak VO2: 16.2 ml/kg/min predicted peak VO2: 58.4% VE/VCO2 slope: 46. OUES: 1.48 Peak RER: 1.15 Ventilatory Threshold: 10.2 % predicted peak VO2: 36.8%  He was admitted yesterday from home due low output heart failure for initiation of Milrinone and to begin transplant/VAD work-up. Continues with NYHA IIIB-IV symptoms. Compliant with medications. He only takes lasix for weight of 163 pounds or greater.    Yesterday he was started amiodarone drip due to frequent PVCs. PVC Burden ~10%. Also received IV lasix for elevated CVP. He continued on Milrinone at  0.25 mcg. Weight down 2 pounds over night. Overall his weight is down 7 pounds.   Denies SOB/PND/Orthopnea.   CO-OX 65>71. CVP personally check 8-9    PRE VAD TESTS  O negative Blood Typing  Odered 12/07/12 Doppler PreCABG in EPIC - Carotid ok and ABIs pending   Odered 12/07/12 PFTs (Full with DLCO)  Odered 12/07/12 TSH, Hepatitis Panel, HIV  ______ Chest/Abdominal CT if previous chest or abdominal surgery  NA  Venous Dopplers if suspect DVT  ______ Panorex (teeth questionable)  Ordered 12/10/11 Chest X-Ray (2 view)  Odered 12/07/12 Abdominal US for AAA screen if > 73 yrs old or presence of PAD  ______ Social Work and Palliative Consult (Pre-VAD workup in comments)  Odered 12/07/12 ECHO with EF documented <25%        Objective:   Weight Range:  Vital Signs:   Temp:  [97.7 F (36.5 C)-98.9 F (37.2 C)] 97.9 F (36.6 C) (04/11 0343) Pulse Rate:  [83-87] 83 (04/11 0400) Resp:  [16-18] 16 (04/11 0400) BP: (112-153)/(71-94) 134/90 mmHg (04/11 0400) SpO2:  [95 %-96 %] 95 % (04/11 0400) Weight:  [157 lb 6.5 oz (71.4 kg)] 157 lb 6.5 oz (71.4 kg) (04/11 0500) Last BM Date: 12/08/12  Weight change: Filed Weights   12/07/12 1300 12/08/12 0500 12/09/12 0500  Weight: 162 lb 8 oz (73.71 kg) 159 lb 9.8 oz (72.4 kg) 157 lb 6.5 oz (71.4 kg)    Intake/Output:   Intake/Output Summary (Last 24 hours) at 12/09/12 0729 Last data filed at 12/09/12 [id number]  Gross per 24 hour  Intake 1408.93 ml  Output   5125 ml  Net -3716.07 ml     Physical Exam: CVP personally check 8-9 General:  Well appearing. No resp difficulty HEENT:  normal Neck: supple. JVP 8-9 . Carotids 2+ bilat; no bruits. No lymphadenopathy or thryomegaly appreciated. Cor: PMI nondisplaced. Irregular rate & rhythm. No rubs, gallops or murmurs. Lungs: clear Abdomen: soft, nontender, nondistended. No hepatosplenomegaly. No bruits or masses. Good bowel sounds. Extremities: no cyanosis, clubbing, rash, tr edema RUE PICC Neuro: alert & orientedx3, cranial nerves grossly intact. moves all 4 extremities w/o difficulty. Affect pleasant  Telemetry: SR very frequent PVCs with bigeminy and NSVT  Labs: Basic Metabolic Panel:  Recent Labs Lab 12/07/12 1200 12/08/12 0500 12/09/12 0500  NA 139 139  141  K 4.2 3.3* 3.8  CL 104 107 107  CO2 24 24 27   GLUCOSE 96 113* 92  BUN 36* 24* 24*  CREATININE 1.49* 1.36* 1.50*  CALCIUM 9.4 8.7 8.7  MG 2.1  --  1.9    Liver Function Tests:  Recent Labs Lab 12/07/12 1200  AST 26  ALT 23  ALKPHOS 102  BILITOT 1.0  PROT 6.6  ALBUMIN 3.5   No results found for this basename: LIPASE, AMYLASE,  in the last 168 hours No results found for this basename: AMMONIA,  in the last 168 hours  CBC:  Recent Labs Lab 12/07/12 1200  WBC 6.4  NEUTROABS 4.5  HGB 12.9*  HCT 39.0  MCV 85.7  PLT 165    Cardiac Enzymes: No results found for this basename: CKTOTAL, CKMB, CKMBINDEX, TROPONINI,  in the last 168 hours  BNP: BNP (last 3 results)  Recent Labs  12/07/12 1200  PROBNP 3310.0*     Imaging: US Aorta  12/08/2012  *RADIOLOGY REPORT*  Clinical Data:  Screening, transplant evaluation  ULTRASOUND OF ABDOMINAL AORTA  Technique:  Ultrasound examination of the abdominal aorta was performed to evaluate for abdominal aortic aneurysm.  Comparison: CT 09/24/2010  Abdominal Aorta:  No aneurysm identified. Patchy atheromatous plaque in the distal segment.        Maximum AP diameter:  2.2 cm.       Maximum TRV diameter:  2.2 cm.  IMPRESSION: No abdominal aortic aneurysm identified.   Original Report Authenticated By: D. Wallace Going, MD    Dg Chest Port 1 View  12/07/2012  *RADIOLOGY REPORT*  Clinical Data: Confirm line placement  PORTABLE CHEST - 1 VIEW  Comparison: Prior chest x-ray 09/20/2012  Findings: Interval placement of right upper extremity approach PICC.  The catheter tip projects over the superior cavoatrial junction.  There is moderate cardiomegaly which appears progressed compared to the prior study.  This may be partly due to differences in technique.  Pulmonary vascular congestion without overt edema. Left basilar atelectasis versus consolidation.  Small bilateral pleural effusions.  Left subclavian approach cardiac rhythm maintenance  device remains in unchanged position.  IMPRESSION:  1.  The tip of a newly placed right upper extremity approach PICC projects over the superior cavoatrial junction.  2.  Apparent progression of cardiomegaly compared to 09/20/2012 may be partly due to differences in technique.  3.  Pulmonary vascular congestion, small bilateral effusions and left lower lobe atelectasis versus infiltrate.   Original Report Authenticated By: Jacqulynn Cadet, M.D.      Medications:     Scheduled Medications: . aspirin EC  325 mg Oral Daily  . carvedilol  3.125 mg Oral BID WC  . clopidogrel  75 mg Oral Q breakfast  . enoxaparin (LOVENOX) injection  40 mg Subcutaneous Daily  . ezetimibe  10 mg Oral Daily  . furosemide  40 mg Intravenous BID  . hydrALAZINE  12.5 mg Oral Q8H  . isosorbide mononitrate  15 mg Oral Daily  . levothyroxine  150 mcg Oral QAC breakfast  . rosuvastatin  20 mg Oral q1800  . sodium chloride  10-40 mL Intracatheter Q12H  . sodium chloride  3 mL Intravenous Q12H    Infusions: . amiodarone (NEXTERONE PREMIX) 360 mg/200 mL dextrose 30 mg/hr (12/09/12 0000)  . milrinone 0.25 mcg/kg/min (12/08/12 0700)    PRN Medications: sodium chloride, acetaminophen, diphenhydrAMINE, ondansetron (ZOFRAN) IV, sodium chloride, sodium chloride   Assessment:   1. A/C systolic Heart Failure 123XX123 ECHO EF 20-25%  2. Hypothyroidism  3. CAD s/p stent to LCX 2004 with re-occlusion of LCX with lateral MI 2009 and repeat stenting with a Promus, total occlusion of RCA  4. S/P ICD 2009  5. Hyperlipidemia  6. Arthritis S/P TKR 2013 7. Hypokalemia 8. NSVT   Plan/Discussion:    Device interrogated PVC burden 10% prior to initiation of Milrinone. Continue Amiodarone drip and transition to  tomorrow. Will need to repeat ECHO in 2-4 weeks.  Magnesium 1.9 will give 2 grams Magnesium today.   Volume status improved from yesterday. CVP 8-9 . Give one more dose of IV lasix and transition to Lasix 40 mg daily.    TSH is up. Free T4 ok. Will leave at 150 mcg.    [facility] to set up for discharge for home Milrinone.   Hepatitis Panel pending PFTs pending  Carotid Dopplers -Ok  ABI- pending.   Length of Stay: 2   CLEGG,AMY 12/09/2012, 7:29 AM  Advanced Heart Failure Team Pager [phone] ([place])  Please contact [facility] for night-coverage after hours (4p -7a ) on [email or web address]  Much improved on IV milrinone. Good diuresis. I reviewed multiple [facility] reports and looks like PVC burden has been 3-5% so doubt cardiomyopathy has PVC component. Will d/c home today on IV milrinone and amiodarone. Reports sent to [facility] for Tx eval. Will need to decide on if he needs bridge VAD or if he can wait on milrinone.   Daniel Bensimhon,MD 1:42 PM

## 2012-12-10 DIAGNOSIS — I5023 Acute on chronic systolic (congestive) heart failure: Secondary | ICD-10-CM

## 2012-12-10 DIAGNOSIS — I10 Essential (primary) hypertension: Secondary | ICD-10-CM

## 2012-12-10 DIAGNOSIS — I509 Heart failure, unspecified: Secondary | ICD-10-CM

## 2012-12-10 DIAGNOSIS — I2510 Atherosclerotic heart disease of native coronary artery without angina pectoris: Secondary | ICD-10-CM

## 2012-12-14 ENCOUNTER — Encounter (HOSPITAL_COMMUNITY): Payer: Self-pay

## 2012-12-14 ENCOUNTER — Ambulatory Visit (HOSPITAL_COMMUNITY)
Admit: 2012-12-14 | Discharge: 2012-12-14 | Disposition: A | Discharge status: Home / Self Care | Payer: Medicare Other | Attending: Internal Medicine | Admitting: Internal Medicine

## 2012-12-14 VITALS — BP 128/86 | HR 93 | Wt 157.4 lb

## 2012-12-14 DIAGNOSIS — Z7902 Long term (current) use of antithrombotics/antiplatelets: Secondary | ICD-10-CM | POA: Insufficient documentation

## 2012-12-14 DIAGNOSIS — E785 Hyperlipidemia, unspecified: Secondary | ICD-10-CM | POA: Insufficient documentation

## 2012-12-14 DIAGNOSIS — Z79899 Other long term (current) drug therapy: Secondary | ICD-10-CM | POA: Insufficient documentation

## 2012-12-14 DIAGNOSIS — I509 Heart failure, unspecified: Secondary | ICD-10-CM | POA: Insufficient documentation

## 2012-12-14 DIAGNOSIS — I493 Ventricular premature depolarization: Secondary | ICD-10-CM

## 2012-12-14 DIAGNOSIS — I5022 Chronic systolic (congestive) heart failure: Secondary | ICD-10-CM | POA: Insufficient documentation

## 2012-12-14 DIAGNOSIS — E039 Hypothyroidism, unspecified: Secondary | ICD-10-CM | POA: Insufficient documentation

## 2012-12-14 DIAGNOSIS — I2582 Chronic total occlusion of coronary artery: Secondary | ICD-10-CM | POA: Insufficient documentation

## 2012-12-14 DIAGNOSIS — Z95810 Presence of automatic (implantable) cardiac defibrillator: Secondary | ICD-10-CM | POA: Insufficient documentation

## 2012-12-14 DIAGNOSIS — M129 Arthropathy, unspecified: Secondary | ICD-10-CM | POA: Insufficient documentation

## 2012-12-14 DIAGNOSIS — I2510 Atherosclerotic heart disease of native coronary artery without angina pectoris: Secondary | ICD-10-CM | POA: Insufficient documentation

## 2012-12-14 DIAGNOSIS — I4949 Other premature depolarization: Secondary | ICD-10-CM | POA: Insufficient documentation

## 2012-12-14 DIAGNOSIS — I2589 Other forms of chronic ischemic heart disease: Secondary | ICD-10-CM | POA: Insufficient documentation

## 2012-12-14 DIAGNOSIS — I252 Old myocardial infarction: Secondary | ICD-10-CM | POA: Insufficient documentation

## 2012-12-14 DIAGNOSIS — Z7982 Long term (current) use of aspirin: Secondary | ICD-10-CM | POA: Insufficient documentation

## 2012-12-14 NOTE — Patient Instructions (Addendum)
Cut back lasix to 20 mg (1/2 tab) daily.    Follow up 3 weeks with Dr. Haroldine Laws.

## 2012-12-14 NOTE — Progress Notes (Signed)
PCP: Dr Laurann Montana Cardiologist: Dr Martinique  HPI: Johnny Oneill is a 72 year old male with history of  CAD s/p stent to LCX 2004 with re-occlusion of LCX with lateral MI 2009 and repeat stenting, total occlusion of RCA, chronic systolic heart failure EF 35%, ICD 2009, PVCs, hypothyroidism, hyperlipidemia, and arthritis. S/P L TKR 2013. Last Cath 2009.   10/2012 ECHO EF 25-30%. Mild to mod Johnny. RV ok.   11/08/2012 Myoview- Large scar present with prior MI    11/14/12 CPX Peak VO2: 16.2 ml/kg/min  predicted peak VO2: 58.4% VE/VCO2 slope: 46. OUES: 1.48 Peak RER: 1.15 Ventilatory Threshold: 10.2 % predicted peak VO2: 36.8%  RHC/LHC 12/01/12  RA 19/15, mean of 13 mm Hg  RV 65/10 mm Hg  PA 59/25, mean of 37 mm Hg  PCWP 26/24, mean of 20 mm Hg  LV 116/26 mm Hg  AO 125/88 mean of 106 mm Hg  Oxygen saturations:  PA 51%  AO 94%  Cardiac Output (Fick) 2.8 L/min , Thermo-3 L/min  Cardiac Index (Fick) 1.5 L/min/m2, Thermo 1.6 L/min/m2  Coronary angiography:  Coronary dominance: right  Left mainstem: less than 10% disease  Left anterior descending (LAD): Heavily calcified with 40% segmental disease in the proximal vessel. Diffuse irregularities.  Left circumflex (LCx): Widely patent stents in the proximal and mid vessel. Less than 20% disease.  Right coronary artery (RCA): Occluded proximally. Left to right collaterals to the RCA   Admitted 4/9 with cardiogenic shock requiring milrinone initiation at 0.25 mcg/kg/min.  Had PICC line placed and is followed by [facility].  Discharge weight 157 pounds.    He returns for post hospital follow up today with his wife. He feels much better on milrinone.  He is being followed by [facility] for PICC line follow up.  He denies dizziness/sycope.  No SOB, orthopnea, PND.  His weight has fallen from 156 to 152 pounds since discharge.  He has held his lasix once this week because weight dropped.  Currently information has been sent to [facility] for possible transplant evaluation.      Unable to tolerate ACE/ARB due to hypotension and severe hyperkalemia.    Review of Systems: All pertinent positives and negatives as in HPI, otherwise negative.     Past Medical History  Diagnosis Date  . Ischemic cardiomyopathy     a. 10/09 Echo: Sev LV Dysfxn, inf/lat AK, Mod Johnny.  [name];  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 Hypertension   . PVC (premature ventricular contraction)   . Pacemaker   . CHF (congestive heart failure)   . Shortness of breath     Current Outpatient Prescriptions  Medication Sig Dispense Refill  . amiodarone (PACERONE) 200 MG tablet Take 1 tablet (200 mg total) by mouth 2 (two) times daily.  60 tablet  3  . aspirin 325 MG tablet Take 325 mg by mouth daily.        . carvedilol (COREG) 3.125 MG tablet Take 1 tablet (3.125 mg total) by mouth 2 (two)  times daily with a meal.  60 tablet  3  . clopidogrel (PLAVIX) 75 MG tablet Take 1 tablet (75 mg total) by mouth daily.  90 tablet  3  . Coenzyme Q10 (CO Q-10 PO) Take 1 capsule by mouth daily.      [name] ezetimibe (ZETIA) 10 MG tablet Take 1 tablet (10 mg total) by mouth daily.  90 tablet  3  . hydrALAZINE (APRESOLINE) 25 MG tablet Take 0.5 tablets (12.5 mg total) by mouth 3 (three) times daily.  60 tablet  3  . isosorbide mononitrate (IMDUR) 30 MG 24 hr tablet Take 0.5 tablets (15 mg total) by mouth daily.  30 tablet  3  . levothyroxine (SYNTHROID, LEVOTHROID) 150 MCG tablet Take 150 mcg by mouth daily.      . milrinone (PRIMACOR) 20 MG/100ML SOLN infusion Inject 18.6 mcg/min into the vein continuous.  100 mL  6  . nitroGLYCERIN (NITROSTAT) 0.4 MG SL tablet Place  0.4 mg under the tongue every 5 (five) minutes as needed. For chest pain      . oxyCODONE-acetaminophen (PERCOCET) 5-325 MG per tablet Take 0.5-1 tablets by mouth 2 (two) times daily as needed. For pain.      . rosuvastatin (CRESTOR) 20 MG tablet Take 1 tablet (20 mg total) by mouth daily.  90 tablet  3   No current facility-administered medications for this encounter.     Allergies  Allergen Reactions  . Diovan (Valsartan) Other (See Comments)    Hypotension at low dose, hyperkalemia  . Lipitor (Atorvastatin Calcium) Other (See Comments)    Muscle pain   PHYSICAL EXAM: Filed Vitals:   12/14/12 1358  BP: 128/86  Pulse: 93  Weight: 157 lb 6.4 oz (71.396 kg)  SpO2: 97%    General: No respiratory difficulty Wife present  HEENT: normal Neck: supple.  JVD 5-6. Carotids 2+ bilat; no bruits. No lymphadenopathy or thryomegaly appreciated. Cor: PMI laterally displaced. Regular rate & rhythm. No rubs, gallop.s 2/6 Johnny murmur Lungs: clear Abdomen: soft, nontender, nondistended. No hepatosplenomegaly. No bruits or masses. Good bowel sounds. Extremities: no cyanosis, clubbing, rash, edema Neuro: alert & oriented x 3, cranial nerves grossly intact. moves all 4 extremities w/o difficulty. Affect pleasant.     ASSESSMENT & PLAN:

## 2012-12-14 NOTE — Assessment & Plan Note (Signed)
No ischemic symptoms, continue follow up with Dr. Martinique.

## 2012-12-14 NOTE — Assessment & Plan Note (Signed)
NYHA IIIb requiring home inotropes.  He does feel better on home milrinone with increased appetite and ability to get around.  Weight has continued to drop since discharge therefore will cut back lasix 20 mg daily.  Have instructed him and his wife on sliding scale lasix for weight gain and shortness of breath, they voice understanding.  Junie Bame, NP/LVAD coordinator had a long discussion with the patient and his wife today concerning VAD placement and Tx evaluation.  He is currently awaiting call back from [facility] for transplant work up appointment.  Will continue inotropes at this time and await further recommendations from [facility].  All questions were answered today.

## 2012-12-14 NOTE — Addendum Note (Signed)
Encounter addended by: Rande Brunt, NP on: 12/14/2012  3:44 PM<BR>     Documentation filed: Notes Section

## 2012-12-14 NOTE — Assessment & Plan Note (Signed)
Tolerating amiodarone, continue at this time.

## 2012-12-14 NOTE — Progress Notes (Signed)
Patient ID: Johnny Oneill, male   DOB: 11-24-1940, 72 y.o.   MRN: [id number]  VAD Coordinator Note:  Met with patient and wife about Left Ventricular Assist Device therapy. I provided them with my card, a DVD and a pamphlet on the device. We discussed potential complications, risks and benefits. They understand that he will be going for a transplant evaluation at [facility] in the next couple of weeks and that we will continue to the LVAD work up process in case he is not a transplant candidate or his heart failure worsens. He is going to have an appointment with Dr. Cyndia Bent next week and I will be calling them back to set up a time to meet with our social worker, palliative team and to meet a patient. I showed them the equipment and answered all of the questions they have at this time. Follow up with Dr. Haroldine Laws in 3 weeks and discussed calling us if at anytime he starts feeling any worse.   Rande Brunt 3:44 PM VAD Team Pager [phone] (7am - 7am)

## 2012-12-16 ENCOUNTER — Encounter: Payer: Self-pay | Admitting: Internal Medicine

## 2012-12-21 ENCOUNTER — Institutional Professional Consult (permissible substitution) (INDEPENDENT_AMBULATORY_CARE_PROVIDER_SITE_OTHER): Payer: Medicare Other | Admitting: Surgery

## 2012-12-21 ENCOUNTER — Encounter: Payer: Self-pay | Admitting: Surgery

## 2012-12-21 VITALS — BP 118/73 | HR 78 | Resp 20 | Ht 67.0 in | Wt 157.0 lb

## 2012-12-21 DIAGNOSIS — I509 Heart failure, unspecified: Secondary | ICD-10-CM

## 2012-12-22 ENCOUNTER — Encounter: Payer: Self-pay | Admitting: Surgery

## 2012-12-22 NOTE — Progress Notes (Signed)
[address]       [place] [address]             [phone]      PCP is Irven Shelling, MD Referring Provider is Bensimhon, Shaune Pascal, MD  Chief Complaint  Patient presents with  . Congestive Heart Failure    surgical eval for VAD placement    HPI:  Johnny Oneill is a 72 year old male with a h/o CAD s/p stent to LCX 2004 with re-occlusion of LCX with lateral MI 2009 and repeat stenting with a Promus stent, total occlusion of RCA, chronic systolic heart failure with an EF 20-25%, ICD 2009, PVCs. He was recently referred to [facility] for low output symptoms confirmed by cath. He and his wife note that he began feeling poorly just before Christmas and he has had progressive exertional dyspnea, fatigue, peripheral edema and abdominal distension with increasing weight. His wife says he looked grey. He was recently admitted for medical optimizing and started on Milrinone with a marked improvement in symptoms. 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    Past Medical History  Diagnosis Date  . Ischemic cardiomyopathy     a. 10/09 Echo: Sev LV Dysfxn, inf/lat AK, Mod Johnny.  [name];  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 Hypertension   . PVC (premature ventricular contraction)   . Pacemaker   . CHF (congestive heart failure)   . Shortness of breath     Past Surgical History  Procedure Laterality Date  . Defibrillator  2009  . Total knee arthroplasty  11/27/11    left  . Insert / replace / remove pacemaker  2009  . Coronary angioplasty with  stent placement  2004  . Coronary angioplasty with stent placement  07/2008  . Knee arthroscopy w/ partial medial meniscectomy  12/2002    left  . Knee arthroscopy      bilaterally  . Total knee arthroplasty  11/27/2011    Procedure: TOTAL KNEE ARTHROPLASTY;  Surgeon: Yvette Rack., MD;  Location: [facility];  Service: Orthopedics;  Laterality: Left;  left total knee arthroplasty    Family History  Problem Relation Age of Onset  . Heart failure Father   . Stroke Father   . Coronary artery disease Mother   . Heart disease Mother   . Hyperlipidemia Sister     Social History History  Substance Use Topics  . Smoking status: Never Smoker   . Smokeless tobacco: Never Used  . Alcohol Use: Yes     Comment: "occassionlly; last beer was 09/2011"    Current Outpatient Prescriptions  Medication Sig Dispense Refill  . amiodarone (PACERONE) 200 MG tablet Take 1 tablet (200 mg total) by mouth 2 (two) times daily.  60 tablet  3  . aspirin 325 MG tablet Take 325 mg by mouth daily.        . carvedilol (COREG) 3.125  MG tablet Take 1 tablet (3.125 mg total) by mouth 2 (two) times daily with a meal.  60 tablet  3  . clopidogrel (PLAVIX) 75 MG tablet Take 1 tablet (75 mg total) by mouth daily.  90 tablet  3  . Coenzyme Q10 (CO Q-10 PO) Take 1 capsule by mouth daily.      [name] ezetimibe (ZETIA) 10 MG tablet Take 1 tablet (10 mg total) by mouth daily.  90 tablet  3  . furosemide (LASIX) 40 MG tablet Take 20 mg by mouth daily.       . hydrALAZINE (APRESOLINE) 25 MG tablet Take 0.5 tablets (12.5 mg total) by mouth 3 (three) times daily.  60 tablet  3  . isosorbide mononitrate (IMDUR) 30 MG 24 hr tablet Take 0.5 tablets (15 mg total) by mouth daily.  30 tablet  3  . levothyroxine (SYNTHROID, LEVOTHROID) 150 MCG tablet Take 150 mcg by mouth daily.      . milrinone (PRIMACOR) 20 MG/100ML SOLN infusion Inject 18.6 mcg/min into the vein continuous.  100 mL  6  . nitroGLYCERIN (NITROSTAT) 0.4 MG SL tablet Place 0.4  mg under the tongue every 5 (five) minutes as needed. For chest pain      . oxyCODONE-acetaminophen (PERCOCET) 5-325 MG per tablet Take 0.5-1 tablets by mouth 2 (two) times daily as needed. For pain.      . rosuvastatin (CRESTOR) 20 MG tablet Take 1 tablet (20 mg total) by mouth daily.  90 tablet  3   No current facility-administered medications for this visit.    Allergies  Allergen Reactions  . Diovan (Valsartan) Other (See Comments)    Hypotension at low dose, hyperkalemia  . Lipitor (Atorvastatin Calcium) Other (See Comments)    Muscle pain    Review of Systems  Constitutional: Positive for activity change, fatigue and unexpected weight change.       All improved on Milrinone.  HENT: Negative.   Eyes: Negative.   Respiratory: Positive for cough and shortness of breath.        Both improved on Milrinone.  Cardiovascular: Positive for leg swelling. Negative for chest pain.  Gastrointestinal: Positive for abdominal distention. Negative for nausea, vomiting, abdominal pain, diarrhea, constipation and blood in stool.       Resolved since starting Milrinone.  Endocrine: Negative.   Genitourinary: Negative.   Musculoskeletal: Positive for arthralgias.  Skin: Positive for pallor.  Allergic/Immunologic: Negative.   Neurological: Negative.   Hematological: Negative.   Psychiatric/Behavioral: Negative.     BP 118/73  Pulse 78  Resp 20  Ht 5\' 7"  (1.702 m)  Wt 157 lb (71.215 kg)  BMI 24.58 kg/m2  SpO2 98% Physical Exam  Constitutional: He is oriented to person, place, and time. He appears well-developed and well-nourished. No distress.  HENT:  Head: Normocephalic and atraumatic.  Mouth/Throat: Oropharynx is clear and moist.  Eyes: EOM are normal. Pupils are equal, round, and reactive to light. No scleral icterus.  Neck: Normal range of motion. Neck supple. No JVD present. No tracheal deviation present. No thyromegaly present.  Cardiovascular: Normal rate, regular rhythm,  normal heart sounds and intact distal pulses.  Exam reveals no gallop and no friction rub.   No murmur heard. Pulmonary/Chest: Effort normal and breath sounds normal. No respiratory distress. He has no rales.  Abdominal: Soft. Bowel sounds are normal. He exhibits no distension and no mass. There is no tenderness.  Musculoskeletal: Normal range of motion. He exhibits no edema and no tenderness.  Lymphadenopathy:    He has no cervical adenopathy.  Neurological: He is alert and oriented to person, place, and time. He has normal strength. No cranial nerve deficit or sensory deficit.  Skin: Skin is warm and dry.  Psychiatric: He has a normal mood and affect.     Diagnostic Tests:  *RADIOLOGY REPORT*   Clinical Data:  Screening, transplant evaluation   ULTRASOUND OF ABDOMINAL AORTA   Technique:  Ultrasound examination of the abdominal aorta was performed to evaluate for abdominal aortic aneurysm.   Comparison: CT 09/24/2010   Abdominal Aorta:  No aneurysm identified. Patchy atheromatous plaque in the distal segment.         Maximum AP diameter:  2.2 cm.       Maximum TRV diameter:  2.2 cm.   IMPRESSION: No abdominal aortic aneurysm identified.     Original Report Authenticated By: D. Wallace Going, [facility]*                      [place], [place] [address]                         [phone]   ------------------------------------------------------------ Noninvasive Vascular Lab  Preoperative Vascular Evaluation  Patient:    Prometheus, Croney Johnny #:       [id number] Study Date: 12/07/2012 Gender:     M Age:        58 Height: Weight: BSA: Pt. Status: Room:       2925    SONOGRAPHER  Ralene Cork, RVT  Leeroy Bock, Amy  ADMITTING    Bensimhon, Daniel  ATTENDING    Bensimhon, Quillian Quince Reports also  to:  ------------------------------------------------------------ History and indications:  History  Pre-op evaluation Pre op LVAD  ------------------------------------------------------------ Study information:  Study status:  Routine.    Preoperative vascular evaluation for heart surgery. Carotid duplex exam per standard extablished protocols, limited upper extremity arterial evaluation and ABIs as indicated.  Location:  [facility]  Patient status:  Inpatient.  Physical exam:  Right pressure not obtained due to restricted arm. Brachial pressures:  +--------+----+---+         LeftMax +--------+----+---+ Systolic119 [id number] +--------+----+---+ Arterial pressure indices:  +-----------------+--------+--------------+---------+ Location         PressureBrachial indexWaveform  +-----------------+--------+--------------+---------+ Right ant tibial 19mm Hg1.34          Biphasic  +-----------------+--------+--------------+---------+ Right post tibial123mm Hg1.43          Triphasic +-----------------+--------+--------------+---------+ Left ant tibial  178mm Hg1.02          Biphasic  +-----------------+--------+--------------+---------+ Left post tibial 155mm Hg1.32          Triphasic +-----------------+--------+--------------+---------+ Arterial flow:  +-----------+-------+-------+--------------+---------------+ Location   V sys  V ed   Flow analysis Comment         +-----------+-------+-------+--------------+---------------+ Right CCA -67cm/s 22cm/s ----------------------------- proximal                                               +-----------+-------+-------+--------------+---------------+ Right CCA --58cm/s-17cm/s----------------------------- distal                                                 +-----------+-------+-------+--------------+---------------+  Right ECA   -74cm/s-14cm/s----------------------------- +-----------+-------+-------+--------------+---------------+ Right ICA --36cm/s-14cm/s--------------Mild            proximal                               heterogeneous                                          plaque          +-----------+-------+-------+--------------+---------------+ Right ICA --43cm/s-22cm/s----------------------------- distal                                                 +-----------+-------+-------+--------------+---------------+ Right      21cm/s 10cm/s Antegrade flow--------------- vertebral                                              +-----------+-------+-------+--------------+---------------+ Left CCA - 80cm/s 25cm/s ----------------------------- proximal                                               +-----------+-------+-------+--------------+---------------+ Left CCA - -68cm/s-13cm/s----------------------------- distal                                                 +-----------+-------+-------+--------------+---------------+ Left ECA   -55cm/s-12cm/s----------------------------- +-----------+-------+-------+--------------+---------------+ Left ICA - -32cm/s-11cm/s--------------Mild mixed      proximal                               plaque          +-----------+-------+-------+--------------+---------------+ Left ICA - -43cm/s-18cm/s----------------------------- distal                                                 +-----------+-------+-------+--------------+---------------+ Left       34cm/s 13cm/s Antegrade flow--------------- vertebral                                              +-----------+-------+-------+--------------+---------------+  ------------------------------------------------------------ Summary:  - No significant extracranial carotid artery stenosis   demonstrated. Vertebrals are  patent with antegrade flow. - ABIs falsely elevated. Waveforms within normal limits. Prepared and Electronically Authenticated by  Gae Gallop 2014-04-11T18:15:16.947       [facility]*                      [place] [address]  [place],  [address]                         [phone]   ------------------------------------------------------------ Transthoracic Echocardiography  Patient:    Lytle, Marinacci Johnny #:       [id number] Study Date: 12/07/2012 Gender:     M Age:        70 Height: Weight: BSA: Pt. Status: Room:       Hinckley, Amy  REFERRING    Clegg, Amy  ADMITTING    Bensimhon, Daniel  ATTENDING    Bensimhon, Daniel cc:  ------------------------------------------------------------ LV EF: 30%  ------------------------------------------------------------ Study Conclusions  - Left ventricle: The cavity size was mildly dilated. Wall   thickness was increased in a pattern of mild LVH. The   estimated ejection fraction was 30%. Basal to mid inferior   and posterior akinesis. Basal to mid anterolateral severe   hypokinesis. Features are consistent with a pseudonormal   left ventricular filling pattern, with concomitant   abnormal relaxation and increased filling pressure (grade   2 diastolic dysfunction). - Aortic valve: There was no stenosis. - Mitral valve: Moderate to severe regurgitation. Suspect   infarct-related Johnny given inferior and posterior wall   motion abnormalities. - Left atrium: The atrium was mildly dilated. - Right ventricle: The cavity size was normal. Pacer wire or   catheter noted in right ventricle. Systolic function was   mildly reduced. - Right atrium: The atrium was mildly dilated. - Tricuspid valve: Peak RV-RA gradient: 74mm Hg (S). - Pulmonary arteries: PA peak pressure: 37mm Hg (S). -  Systemic veins: IVC measured 2.1 cm with some   respirophasic variation, suggesting RA pressure 10 mmHg. - Pericardium, extracardiac: A trivial pericardial effusion   was identified. Impressions:  - Mildly dilated LV with mild LV hypertrophy. EF 30% with   wall motion abnormalities as described above. Moderate   diastolic dysfunction. Normal RV size with mildly   decreased systolic function. Severe pulmonary   hypertension. Moderate to sevre mitral regurgitation,   suspect infarct-related given posterior and inferior wall   motion abnormalities. Transthoracic echocardiography.  M-mode, complete 2D, spectral Doppler, and color Doppler.  Patient status: Inpatient.  ------------------------------------------------------------  ------------------------------------------------------------ Left ventricle:  The cavity size was mildly dilated. Wall thickness was increased in a pattern of mild LVH. The estimated ejection fraction was 30%. Basal to mid inferior and posterior akinesis. Basal to mid anterolateral severe hypokinesis. Features are consistent with a pseudonormal left ventricular filling pattern, with concomitant abnormal relaxation and increased filling pressure (grade 2 diastolic dysfunction).  ------------------------------------------------------------ Aortic valve:   Trileaflet; mildly calcified leaflets. Doppler:   There was no stenosis.    No regurgitation.  ------------------------------------------------------------ Aorta:  Aortic root: The aortic root was normal in size. Ascending aorta: The ascending aorta was normal in size.  ------------------------------------------------------------ Mitral valve:   Doppler:   There was no evidence for stenosis.   Moderate to severe regurgitation. Suspect infarct-related Johnny given inferior and posterior wall motion abnormalities.    Peak gradient: 78mm Hg (D).  ------------------------------------------------------------ Left  atrium:  The atrium was mildly dilated.  ------------------------------------------------------------ Right ventricle:  The cavity size was normal. Pacer wire or catheter noted in right ventricle. Systolic function was mildly reduced.  ------------------------------------------------------------ Pulmonic valve:    Structurally normal valve.   Cusp separation was normal.  Doppler:  Transvalvular  velocity was within the normal range.  Trivial regurgitation.  ------------------------------------------------------------ Tricuspid valve:   Doppler:   Mild regurgitation.  ------------------------------------------------------------ Right atrium:  The atrium was mildly dilated.  ------------------------------------------------------------ Pericardium:  A trivial pericardial effusion was identified.   ------------------------------------------------------------ Systemic veins:  IVC measured 2.1 cm with some respirophasic variation, suggesting RA pressure 10 mmHg.  ------------------------------------------------------------  2D measurements        Normal  Doppler measurements   Normal Left ventricle                 Main pulmonary LVID ED,     60.5 mm   43-52   artery chord, PLAX                    Pressure, S    74 mm   =30 LVID ES,     56.5 mm   23-38                     Hg chord, PLAX                    Mitral valve FS, chord,      7 %    >29     Peak E vel    84. cm/s ------ PLAX                                           9 Aorta                          Peak A vel    54. cm/s ------ Root diam,     29 mm   ------                  8 ED                             Deceleration  176 ms   150-23 Left atrium                    time                   0 AP dim         47 mm   ------  Peak            3 mm   ------                                gradient, D       Hg                                Peak E/A      1.5      ------                                ratio                                 Regurg alias  38. cm/s ------  vel, PISA       5                                Max regurg    624 cm/s ------                                vel                                Regurg VTI    188 cm   ------                                ERO, PISA     0.1 cm^2 ------                                Regurg vol,    19 ml   ------                                PISA                                Tricuspid valve                                Regurg peak   399 cm/s ------                                vel                                Peak RV-RA     64 mm   ------                                gradient, S       Hg   ------------------------------------------------------------ Prepared and Electronically Authenticated by  Loralie Champagne 2014-04-11T13:58:20.613   PRE VAD TESTS  O negative   Hepatitis A, B, C negative  HIV pending  TSH mildly elevated with normal free T4  PFT's pending                11/14/12 CPX Peak VO2: 16.2 ml/kg/min predicted peak VO2: 58.4% VE/VCO2 slope: 46. OUES: 1.48 Peak RER: 1.15 Ventilatory Threshold: 10.2 % predicted peak VO2: 36.8%  RHC/LHC 12/01/12  RA 19/15, mean of 13 mm Hg   RV 65/10 mm Hg   PA 59/25, mean of 37 mm Hg   PCWP 26/24, mean of 20 mm Hg   LV 116/26 mm Hg   AO 125/88 mean of 106 mm Hg   Oxygen saturations:   PA 51%   AO 94%   Cardiac Output (Fick) 2.8 L/min , Thermo-3 L/min   Cardiac Index (Fick) 1.5 L/min/m2, Thermo 1.6 L/min/m2   Coronary angiography:   Coronary dominance: right   Left mainstem: less than 10% disease  Left anterior descending (LAD): Heavily calcified with 40% segmental disease in the proximal vessel. Diffuse irregularities.   Left circumflex (LCx): Widely patent stents in the proximal and mid vessel. Less than 20% disease.   Right coronary artery (RCA): Occluded proximally. Left to right collaterals to the RCA  Impression/Plan:  This active gentleman was recently  admitted with acute on chronic systolic heart failure and started on Milrinone with an increase in his CO-Ox to 71% with a marked improvement in symptoms. He has severe LV dysfunction and I think he would probably be a good transplant candidate. He has an appt to be evaluated by the heart transplant team at [facility] in the next couple weeks. He would be a good candidate for LVAD therapy as a bridge to transplant if needed or destination if he is not felt to be a transplant candidate for some reason. I discussed LVAD therapy with the patient and his wife, the indications, risks and benefits and answered their questions. We will await his transplant evaluation.

## 2012-12-23 ENCOUNTER — Ambulatory Visit: Payer: Medicare Other | Admitting: Cardiology

## 2012-12-26 ENCOUNTER — Encounter (HOSPITAL_COMMUNITY): Payer: Medicare Other

## 2013-01-05 ENCOUNTER — Encounter (HOSPITAL_COMMUNITY): Payer: Self-pay

## 2013-01-05 ENCOUNTER — Ambulatory Visit (HOSPITAL_COMMUNITY)
Admission: RE | Admit: 2013-01-05 | Discharge: 2013-01-05 | Disposition: A | Discharge status: Home / Self Care | Payer: Medicare Other | Source: Ambulatory Visit | Attending: Internal Medicine | Admitting: Internal Medicine

## 2013-01-05 ENCOUNTER — Encounter (HOSPITAL_COMMUNITY): Payer: Self-pay | Admitting: *Deleted

## 2013-01-05 VITALS — BP 132/68 | HR 83 | Ht 67.0 in | Wt 159.8 lb

## 2013-01-05 DIAGNOSIS — I252 Old myocardial infarction: Secondary | ICD-10-CM | POA: Insufficient documentation

## 2013-01-05 DIAGNOSIS — E785 Hyperlipidemia, unspecified: Secondary | ICD-10-CM | POA: Insufficient documentation

## 2013-01-05 DIAGNOSIS — F411 Generalized anxiety disorder: Secondary | ICD-10-CM | POA: Insufficient documentation

## 2013-01-05 DIAGNOSIS — Z79899 Other long term (current) drug therapy: Secondary | ICD-10-CM | POA: Insufficient documentation

## 2013-01-05 DIAGNOSIS — Z7902 Long term (current) use of antithrombotics/antiplatelets: Secondary | ICD-10-CM | POA: Insufficient documentation

## 2013-01-05 DIAGNOSIS — I2584 Coronary atherosclerosis due to calcified coronary lesion: Secondary | ICD-10-CM | POA: Insufficient documentation

## 2013-01-05 DIAGNOSIS — I4949 Other premature depolarization: Secondary | ICD-10-CM | POA: Insufficient documentation

## 2013-01-05 DIAGNOSIS — Z7982 Long term (current) use of aspirin: Secondary | ICD-10-CM | POA: Insufficient documentation

## 2013-01-05 DIAGNOSIS — E7800 Pure hypercholesterolemia, unspecified: Secondary | ICD-10-CM | POA: Insufficient documentation

## 2013-01-05 DIAGNOSIS — Z95810 Presence of automatic (implantable) cardiac defibrillator: Secondary | ICD-10-CM | POA: Insufficient documentation

## 2013-01-05 DIAGNOSIS — I5022 Chronic systolic (congestive) heart failure: Secondary | ICD-10-CM | POA: Insufficient documentation

## 2013-01-05 DIAGNOSIS — I2510 Atherosclerotic heart disease of native coronary artery without angina pectoris: Secondary | ICD-10-CM | POA: Insufficient documentation

## 2013-01-05 DIAGNOSIS — E039 Hypothyroidism, unspecified: Secondary | ICD-10-CM | POA: Insufficient documentation

## 2013-01-05 DIAGNOSIS — I2589 Other forms of chronic ischemic heart disease: Secondary | ICD-10-CM | POA: Insufficient documentation

## 2013-01-05 DIAGNOSIS — I4510 Unspecified right bundle-branch block: Secondary | ICD-10-CM | POA: Insufficient documentation

## 2013-01-05 DIAGNOSIS — M1990 Unspecified osteoarthritis, unspecified site: Secondary | ICD-10-CM | POA: Insufficient documentation

## 2013-01-05 DIAGNOSIS — Z9861 Coronary angioplasty status: Secondary | ICD-10-CM | POA: Insufficient documentation

## 2013-01-05 DIAGNOSIS — Z96659 Presence of unspecified artificial knee joint: Secondary | ICD-10-CM | POA: Insufficient documentation

## 2013-01-05 DIAGNOSIS — I2582 Chronic total occlusion of coronary artery: Secondary | ICD-10-CM | POA: Insufficient documentation

## 2013-01-05 NOTE — Patient Instructions (Addendum)
Follow up 1 month  Your physician has requested that you have a cardiac catheterization. Cardiac catheterization is used to diagnose and/or treat various heart conditions. Doctors may recommend this procedure for a number of different reasons. The most common reason is to evaluate chest pain. Chest pain can be a symptom of coronary artery disease (CAD), and cardiac catheterization can show whether plaque is narrowing or blocking your heart's arteries. This procedure is also used to evaluate the valves, as well as measure the blood flow and oxygen levels in different parts of your heart. For further information please visit [email or web address]. Please follow instruction sheet, as given.

## 2013-01-08 NOTE — Progress Notes (Signed)
PCP: Dr Laurann [place] Cardiologist: Dr Martinique  HPI: Johnny Oneill is a 72 year old male with history of  CAD s/p stent to LCX 2004 with re-occlusion of LCX with lateral MI 2009 and repeat stenting, total occlusion of RCA, chronic systolic heart failure EF 35%, ICD 2009, PVCs, hypothyroidism, hyperlipidemia, and arthritis. S/P L TKR 2013. Last Cath 2009.   10/2012 ECHO EF 25-30%. Mild to mod Johnny. RV ok.   11/08/2012 Myoview- Large scar present with prior MI    11/14/12 CPX Peak VO2: 16.2 ml/kg/min  predicted peak VO2: 58.4% VE/VCO2 slope: 46. OUES: 1.48 Peak RER: 1.15 Ventilatory Threshold: 10.2 % predicted peak VO2: 36.8%  RHC/LHC 12/01/12  RA 19/15, mean of 13 mm Hg  RV 65/10 mm Hg  PA 59/25, mean of 37 mm Hg  PCWP 26/24, mean of 20 mm Hg  LV 116/26 mm Hg  AO 125/88 mean of 106 mm Hg  Oxygen saturations:  PA 51%  AO 94%  Cardiac Output (Fick) 2.8 L/min , Thermo-3 L/min  Cardiac Index (Fick) 1.5 L/min/m2, Thermo 1.6 L/min/m2  Coronary angiography:  Coronary dominance: right  Left mainstem: less than 10% disease  Left anterior descending (LAD): Heavily calcified with 40% segmental disease in the proximal vessel. Diffuse irregularities.  Left circumflex (LCx): Widely patent stents in the proximal and mid vessel. Less than 20% disease.  Right coronary artery (RCA): Occluded proximally. Left to right collaterals to the RCA   Admitted 4/9 with cardiogenic shock requiring milrinone initiation at 0.25 mcg/kg/min.  Had PICC line placed and is followed by [facility].  Discharge weight 157 pounds.    He returns for follow up with his wife.  He says he is feeling great.  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 Weight 155 pounds at home.  Doing well with lasix 20 mg daily and he is using sliding scale.  Had transplant evaluation on Tuesday.  Goes back 02/10/13.  He has sent his colonoscopy and dental records.  Appetite increased.  No orthopnea/PND.     Unable to tolerate ACE/ARB due to hypotension and severe hyperkalemia.     Review of Systems: All pertinent positives and negatives as in HPI, otherwise negative.     Past Medical History  Diagnosis Date  . Ischemic cardiomyopathy     a. 10/09 Echo: Sev LV Dysfxn, inf/lat AK, Mod Johnny.  [name];  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 Hypertension   . PVC (premature ventricular contraction)   . Pacemaker   . CHF (congestive heart failure)   . Shortness of breath     Current Outpatient Prescriptions  Medication Sig Dispense Refill  . amiodarone (PACERONE) 200 MG tablet Take 1 tablet (200 mg total) by mouth 2 (two) times daily.  60 tablet  3  . aspirin 325 MG tablet Take 325 mg by mouth daily.        . carvedilol (COREG) 3.125 MG tablet Take 1 tablet (3.125 mg total) by mouth 2 (two) times daily with a meal.  60 tablet  3  . clopidogrel (PLAVIX) 75 MG tablet Take 1 tablet (75 mg total) by mouth daily.  90 tablet  3  . Coenzyme Q10 (CO Q-10 PO) Take 1 capsule by mouth daily.      [name] ezetimibe (ZETIA) 10 MG tablet Take 1 tablet (10 mg total) by mouth daily.  90 tablet  3  . furosemide (LASIX) 40 MG tablet Take 20 mg by mouth daily.       . hydrALAZINE (APRESOLINE) 25 MG tablet Take 0.5 tablets (12.5 mg total) by mouth 3 (three) times daily.  60 tablet  3  . isosorbide mononitrate (IMDUR) 30 MG 24 hr tablet Take 0.5 tablets (15 mg total) by mouth daily.  30 tablet  3  . levothyroxine (SYNTHROID, LEVOTHROID) 150 MCG tablet Take 150 mcg by mouth daily.      . milrinone (PRIMACOR) 20 MG/100ML SOLN infusion Inject 18.6 mcg/min into the vein continuous.  100 mL  6  . rosuvastatin (CRESTOR) 20 MG tablet Take 1 tablet (20 mg  total) by mouth daily.  90 tablet  3  . nitroGLYCERIN (NITROSTAT) 0.4 MG SL tablet Place 0.4 mg under the tongue every 5 (five) minutes as needed. For chest pain       No current facility-administered medications for this encounter.     Allergies  Allergen Reactions  . Diovan (Valsartan) Other (See Comments)    Hypotension at low dose, hyperkalemia  . Lipitor (Atorvastatin Calcium) Other (See Comments)    Muscle pain   PHYSICAL EXAM: Filed Vitals:   01/05/13 0945  BP: 132/68  Pulse: 83  Height: 5\' 7"  (1.702 m)  Weight: 159 lb 12.8 oz (72.485 kg)  SpO2: 98%    General: No respiratory difficulty Wife present  HEENT: normal Neck: supple.  JVD 6-7. Carotids 2+ bilat; no bruits. No lymphadenopathy or thryomegaly appreciated. Cor: PMI laterally displaced. Regular rate & rhythm. No rubs, gallop.s 2/6 Johnny murmur Lungs: clear Abdomen: soft, nontender, nondistended. No hepatosplenomegaly. No bruits or masses. Good bowel sounds. Extremities: no cyanosis, clubbing, rash, edema.  RUE PICC Neuro: alert & oriented x 3, cranial nerves grossly intact. moves all 4 extremities w/o difficulty. Affect pleasant.     ASSESSMENT & PLAN:

## 2013-01-08 NOTE — Assessment & Plan Note (Signed)
He has had a phenomenal response to milrinone. Doing very well. Has been to [facility] for transplant evaluation and seems to be a good candidate. He will require f/u [facility] to formally assess response to milrinone. We will schedule for next week. Continue current regimen.

## 2013-01-10 ENCOUNTER — Encounter (HOSPITAL_BASED_OUTPATIENT_CLINIC_OR_DEPARTMENT_OTHER)
Admission: RE | Disposition: A | Discharge status: Home / Self Care | Payer: Self-pay | Source: Ambulatory Visit | Attending: Internal Medicine

## 2013-01-10 ENCOUNTER — Inpatient Hospital Stay (HOSPITAL_BASED_OUTPATIENT_CLINIC_OR_DEPARTMENT_OTHER)
Admission: RE | Admit: 2013-01-10 | Discharge: 2013-01-10 | Disposition: A | Discharge status: Home / Self Care | Payer: Medicare Other | Source: Ambulatory Visit | Attending: Internal Medicine | Admitting: Internal Medicine

## 2013-01-10 DIAGNOSIS — Z79899 Other long term (current) drug therapy: Secondary | ICD-10-CM | POA: Insufficient documentation

## 2013-01-10 DIAGNOSIS — I252 Old myocardial infarction: Secondary | ICD-10-CM | POA: Insufficient documentation

## 2013-01-10 DIAGNOSIS — Z9861 Coronary angioplasty status: Secondary | ICD-10-CM | POA: Insufficient documentation

## 2013-01-10 DIAGNOSIS — I2589 Other forms of chronic ischemic heart disease: Secondary | ICD-10-CM | POA: Insufficient documentation

## 2013-01-10 DIAGNOSIS — I509 Heart failure, unspecified: Secondary | ICD-10-CM | POA: Insufficient documentation

## 2013-01-10 DIAGNOSIS — I5022 Chronic systolic (congestive) heart failure: Secondary | ICD-10-CM | POA: Insufficient documentation

## 2013-01-10 DIAGNOSIS — I10 Essential (primary) hypertension: Secondary | ICD-10-CM | POA: Insufficient documentation

## 2013-01-10 DIAGNOSIS — I2789 Other specified pulmonary heart diseases: Secondary | ICD-10-CM | POA: Insufficient documentation

## 2013-01-10 DIAGNOSIS — I2510 Atherosclerotic heart disease of native coronary artery without angina pectoris: Secondary | ICD-10-CM | POA: Insufficient documentation

## 2013-01-10 DIAGNOSIS — E785 Hyperlipidemia, unspecified: Secondary | ICD-10-CM | POA: Insufficient documentation

## 2013-01-10 DIAGNOSIS — Z95810 Presence of automatic (implantable) cardiac defibrillator: Secondary | ICD-10-CM | POA: Insufficient documentation

## 2013-01-10 LAB — POCT I-STAT 3, VENOUS BLOOD GAS (G3P V)
Bicarbonate: 24.8 mEq/L — ABNORMAL HIGH (ref 20.0–24.0)
Bicarbonate: 25.1 mEq/L — ABNORMAL HIGH (ref 20.0–24.0)
O2 Saturation: 64 %
O2 Saturation: 66 %
TCO2: 26 mmol/L (ref 0–100)
TCO2: 26 mmol/L (ref 0–100)
pCO2, Ven: 41.1 mmHg — ABNORMAL LOW (ref 45.0–50.0)
pCO2, Ven: 42.1 mmHg — ABNORMAL LOW (ref 45.0–50.0)
pH, Ven: 7.384 — ABNORMAL HIGH (ref 7.250–7.300)
pH, Ven: 7.389 — ABNORMAL HIGH (ref 7.250–7.300)
pO2, Ven: 34 mmHg (ref 30.0–45.0)
pO2, Ven: 35 mmHg (ref 30.0–45.0)

## 2013-01-10 LAB — POCT I-STAT 3, ART BLOOD GAS (G3+)
Bicarbonate: 23.3 mEq/L (ref 20.0–24.0)
O2 Saturation: 98 %
TCO2: 24 mmol/L (ref 0–100)
pCO2 arterial: 31.4 mmHg — ABNORMAL LOW (ref 35.0–45.0)
pH, Arterial: 7.478 — ABNORMAL HIGH (ref 7.350–7.450)
pO2, Arterial: 99 mmHg (ref 80.0–100.0)

## 2013-01-10 SURGERY — JV RIGHT HEART CATHETERIZATION

## 2013-01-10 MED ORDER — ONDANSETRON HCL 4 MG/2ML IJ SOLN: 4.0000 mg | Freq: Four times a day (QID) | INTRAMUSCULAR | Status: DC | PRN

## 2013-01-10 MED ORDER — ACETAMINOPHEN 325 MG PO TABS: 650.0000 mg | ORAL_TABLET | ORAL | Status: DC | PRN

## 2013-01-10 MED ORDER — SODIUM CHLORIDE 0.9 % IV SOLN
250.0000 mL | INTRAVENOUS | Status: DC | PRN
  Administered 2013-01-10: 250 mL via INTRAVENOUS

## 2013-01-10 MED ORDER — SODIUM CHLORIDE 0.9 % IJ SOLN: 3.0000 mL | Freq: Two times a day (BID) | INTRAMUSCULAR | Status: DC

## 2013-01-10 MED ORDER — SODIUM CHLORIDE 0.9 % IV SOLN: 250.0000 mL | INTRAVENOUS | Status: DC | PRN

## 2013-01-10 MED ORDER — SODIUM CHLORIDE 0.9 % IJ SOLN: 3.0000 mL | INTRAMUSCULAR | Status: DC | PRN

## 2013-01-10 NOTE — H&P (View-Only) (Signed)
PCP: Johnny Oneill Cardiologist: Johnny Oneill  HPI: [name] is a 72 year old male with history of  CAD s/p stent to LCX 2004 with re-occlusion of LCX with lateral MI 2009 and repeat stenting, total occlusion of RCA, chronic systolic heart failure EF 35%, ICD 2009, PVCs, hypothyroidism, hyperlipidemia, and arthritis. S/P L TKR 2013. Last Cath 2009.   10/2012 ECHO EF 25-30%. Mild to mod MR. RV ok.   11/08/2012 Myoview- Large scar present with prior MI    11/14/12 CPX Peak VO2: 16.2 ml/kg/min  predicted peak VO2: 58.4% VE/VCO2 slope: 46. OUES: 1.48 Peak RER: 1.15 Ventilatory Threshold: 10.2 % predicted peak VO2: 36.8%  RHC/LHC 12/01/12  RA 19/15, mean of 13 mm Hg  RV 65/10 mm Hg  PA 59/25, mean of 37 mm Hg  PCWP 26/24, mean of 20 mm Hg  LV 116/26 mm Hg  AO 125/88 mean of 106 mm Hg  Oxygen saturations:  PA 51%  AO 94%  Cardiac Output (Fick) 2.8 L/min , Thermo-3 L/min  Cardiac Index (Fick) 1.5 L/min/m2, Thermo 1.6 L/min/m2  Coronary angiography:  Coronary dominance: right  Left mainstem: less than 10% disease  Left anterior descending (LAD): Heavily calcified with 40% segmental disease in the proximal vessel. Diffuse irregularities.  Left circumflex (LCx): Widely patent stents in the proximal and mid vessel. Less than 20% disease.  Right coronary artery (RCA): Occluded proximally. Left to right collaterals to the RCA   Admitted 4/9 with cardiogenic shock requiring milrinone initiation at 0.25 mcg/kg/min.  Had PICC line placed and is followed by [facility].  Discharge weight 157 pounds.    He returns for follow up with his wife.  He says he is feeling great.  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 Weight 155 pounds at home.  Doing well with lasix 20 mg daily and he is using sliding scale.  Had transplant evaluation on Tuesday.  Goes back 02/10/13.  He has sent his colonoscopy and dental records.  Appetite increased.  No orthopnea/PND.     Unable to tolerate ACE/ARB due to hypotension and severe hyperkalemia.     Review of Systems: All pertinent positives and negatives as in HPI, otherwise negative.     Past Medical History  Diagnosis Date  . Ischemic cardiomyopathy     a. 10/09 Echo: Sev LV Dysfxn, inf/lat AK, Mod MR.  [name];  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 Hypertension   . PVC (premature ventricular contraction)   . Pacemaker   . CHF (congestive heart failure)   . Shortness of breath     Current Outpatient Prescriptions  Medication Sig Dispense Refill  . amiodarone (PACERONE) 200 MG tablet Take 1 tablet (200 mg total) by mouth 2 (two) times daily.  60 tablet  3  . aspirin 325 MG tablet Take 325 mg by mouth daily.        . carvedilol (COREG) 3.125 MG tablet Take 1 tablet (3.125 mg total) by mouth 2 (two) times daily with a meal.  60 tablet  3  . clopidogrel (PLAVIX) 75 MG tablet Take 1 tablet (75 mg total) by mouth daily.  90 tablet  3  . Coenzyme Q10 (CO Q-10 PO) Take 1 capsule by mouth daily.      [name] ezetimibe (ZETIA) 10 MG tablet Take 1 tablet (10 mg total) by mouth daily.  90 tablet  3  . furosemide (LASIX) 40 MG tablet Take 20 mg by mouth daily.       . hydrALAZINE (APRESOLINE) 25 MG tablet Take 0.5 tablets (12.5 mg total) by mouth 3 (three) times daily.  60 tablet  3  . isosorbide mononitrate (IMDUR) 30 MG 24 hr tablet Take 0.5 tablets (15 mg total) by mouth daily.  30 tablet  3  . levothyroxine (SYNTHROID, LEVOTHROID) 150 MCG tablet Take 150 mcg by mouth daily.      . milrinone (PRIMACOR) 20 MG/100ML SOLN infusion Inject 18.6 mcg/min into the vein continuous.  100 mL  6  . rosuvastatin (CRESTOR) 20 MG tablet Take 1 tablet (20 mg  total) by mouth daily.  90 tablet  3  . nitroGLYCERIN (NITROSTAT) 0.4 MG SL tablet Place 0.4 mg under the tongue every 5 (five) minutes as needed. For chest pain       No current facility-administered medications for this encounter.     Allergies  Allergen Reactions  . Diovan (Valsartan) Other (See Comments)    Hypotension at low dose, hyperkalemia  . Lipitor (Atorvastatin Calcium) Other (See Comments)    Muscle pain   PHYSICAL EXAM: Filed Vitals:   01/05/13 0945  BP: 132/68  Pulse: 83  Height: 5\' 7"  (1.702 m)  Weight: 159 lb 12.8 oz (72.485 kg)  SpO2: 98%    General: No respiratory difficulty Wife present  HEENT: normal Neck: supple.  JVD 6-7. Carotids 2+ bilat; no bruits. No lymphadenopathy or thryomegaly appreciated. Cor: PMI laterally displaced. Regular rate & rhythm. No rubs, gallop.s 2/6 MR murmur Lungs: clear Abdomen: soft, nontender, nondistended. No hepatosplenomegaly. No bruits or masses. Good bowel sounds. Extremities: no cyanosis, clubbing, rash, edema.  RUE PICC Neuro: alert & oriented x 3, cranial nerves grossly intact. moves all 4 extremities w/o difficulty. Affect pleasant.     ASSESSMENT & PLAN:

## 2013-01-10 NOTE — Progress Notes (Signed)
Received from cath procedure alert and oriented.  Right femoral site intact and no problems.  Pt denies any discomfort at this time.  Dr D Bensimhon in to talk to family about the results of test and plan of care.

## 2013-01-10 NOTE — Interval H&P Note (Signed)
History and Physical Interval Note:  01/10/2013 10:24 AM  Johnny Oneill  has presented today for surgery, with the diagnosis of HF  The various methods of treatment have been discussed with the patient and family. After consideration of risks, benefits and other options for treatment, the patient has consented to  Procedure(s): JV RIGHT HEART CATHETERIZATION (N/A) as a surgical intervention .  The patient's history has been reviewed, patient examined, no change in status, stable for surgery.  I have reviewed the patient's chart and labs.  Questions were answered to the patient's satisfaction.     Drew Lips

## 2013-01-10 NOTE — CV Procedure (Signed)
Cardiac Cath Procedure Note:  Indication:  Heart failure  Procedures performed:  1) Right heart catheterization  Description of procedure:   The risks and indication of the procedure were explained. Consent was signed and placed on the chart. An appropriate timeout was taken prior to the procedure. The right groin was prepped and draped in the routine sterile fashion and anesthetized with 1% local lidocaine.   A 7 FR venous sheath was placed in the right femoral vein using a modified Seldinger technique. A standard Swan-Ganz catheter was used for the procedure.   Complications: None apparent.  Findings:  Done on milrinone 0.25 mcg/kg/min  RA = 5 RV = 33/1/5 PA = 42/16 (26) PCW = 13 Fick cardiac output/index = 4.0/2.2 PVR = 3.2 Woods FA sat = 98% PA sat =66%, 64%  Assessment:  1. Normal left-sided pressure with mild PAH and normal cardiac output  Plan/Discussion:  He has had a marked improvement both symptomatically and hemodynamically on milrinone. Continue current regimen. Proceed with transplant work-up at [facility].  Johnny Oneill 11:59 AM

## 2013-01-10 NOTE — Progress Notes (Signed)
2nd PIC lumen port  clotted off.  Unable to start normal saline in that port.

## 2013-01-17 ENCOUNTER — Encounter: Payer: Self-pay | Admitting: Internal Medicine

## 2013-01-18 ENCOUNTER — Encounter: Payer: Self-pay | Admitting: Internal Medicine

## 2013-01-24 ENCOUNTER — Other Ambulatory Visit: Payer: Self-pay

## 2013-01-24 ENCOUNTER — Encounter (HOSPITAL_COMMUNITY): Payer: Self-pay | Admitting: Anesthesiology

## 2013-01-24 ENCOUNTER — Telehealth (HOSPITAL_COMMUNITY): Payer: Self-pay | Admitting: Anesthesiology

## 2013-01-24 DIAGNOSIS — Z01818 Encounter for other preprocedural examination: Secondary | ICD-10-CM

## 2013-01-24 DIAGNOSIS — I5022 Chronic systolic (congestive) heart failure: Secondary | ICD-10-CM

## 2013-01-24 NOTE — Progress Notes (Signed)
This encounter was created in error - please disregard.

## 2013-01-27 NOTE — Telephone Encounter (Signed)
Error

## 2013-02-06 ENCOUNTER — Telehealth (HOSPITAL_COMMUNITY): Payer: Self-pay | Admitting: Adult Health

## 2013-02-06 ENCOUNTER — Encounter (HOSPITAL_COMMUNITY): Payer: Medicare Other

## 2013-02-06 MED ORDER — POTASSIUM CHLORIDE CRYS ER 20 MEQ PO TBCR: 20.0000 meq | EXTENDED_RELEASE_TABLET | Freq: Every day | ORAL | Status: DC

## 2013-02-06 NOTE — Telephone Encounter (Signed)
Left message to call back regarding lab work.   Potassium 3.4 will start 20 meq of KDUR daily. Repeat BMET next week.   CLEGG,AMY 11:59 AM

## 2013-02-07 ENCOUNTER — Encounter (HOSPITAL_COMMUNITY): Payer: Self-pay

## 2013-02-07 ENCOUNTER — Ambulatory Visit (HOSPITAL_COMMUNITY)
Admission: RE | Admit: 2013-02-07 | Discharge: 2013-02-07 | Disposition: A | Discharge status: Home / Self Care | Payer: Medicare Other | Source: Ambulatory Visit | Attending: Internal Medicine | Admitting: Internal Medicine

## 2013-02-07 VITALS — BP 114/74 | HR 69 | Ht 67.0 in | Wt 161.1 lb

## 2013-02-07 DIAGNOSIS — I5022 Chronic systolic (congestive) heart failure: Secondary | ICD-10-CM | POA: Insufficient documentation

## 2013-02-07 NOTE — Telephone Encounter (Signed)
Pt aware at [place] 6/10

## 2013-02-07 NOTE — Assessment & Plan Note (Signed)
Continues to do very well on milrinone. Will complete Tx work-up at [facility] this week. Reviewed process with him and his wife in detail. Will continue current HF regimen. They know to call with any change in condition. Reinforced need for daily weights and reviewed use of sliding scale diuretics.

## 2013-02-07 NOTE — Progress Notes (Signed)
Patient ID: Johnny Oneill, male   DOB: 1940/11/23, 72 y.o.   MRN: [id number] PCP: Dr Laurann Montana Cardiologist: Dr Martinique  HPI: [name] is a 72 year old male with history of  CAD s/p stent to LCX 2004 with re-occlusion of LCX with lateral MI 2009 and repeat stenting, total occlusion of RCA, chronic systolic heart failure EF 35%, ICD 2009, PVCs, hypothyroidism, hyperlipidemia, and arthritis. S/P L TKR 2013. Last Cath 2009.   10/2012 ECHO EF 25-30%. Mild to mod MR. RV ok.   11/08/2012 Myoview- Large scar present with prior MI    11/14/12 CPX Peak VO2: 16.2 ml/kg/min  predicted peak VO2: 58.4% VE/VCO2 slope: 46. OUES: 1.48 Peak RER: 1.15 Ventilatory Threshold: 10.2 % predicted peak VO2: 36.8%  RHC/LHC 12/01/12  RA 19/15, mean of 13 mm Hg  RV 65/10 mm Hg  PA 59/25, mean of 37 mm Hg  PCWP 26/24, mean of 20 mm Hg  LV 116/26 mm Hg  AO 125/88 mean of 106 mm Hg  Oxygen saturations:  PA 51%  AO 94%  Cardiac Output (Fick) 2.8 L/min , Thermo-3 L/min  Cardiac Index (Fick) 1.5 L/min/m2, Thermo 1.6 L/min/m2  Coronary angiography:  Coronary dominance: right  Left mainstem: less than 10% disease  Left anterior descending (LAD): Heavily calcified with 40% segmental disease in the proximal vessel. Diffuse irregularities.  Left circumflex (LCx): Widely patent stents in the proximal and mid vessel. Less than 20% disease.  Right coronary artery (RCA): Occluded proximally. Left to right collaterals to the RCA   Admitted 4/9 with cardiogenic shock requiring milrinone initiation at 0.25 mcg/kg/min.  Had PICC line placed and is followed by [facility].  Discharge weight 157 pounds.    He returns for follow up with his wife. Evaluated at [facility] last Thursday and he will follow up this week with transplant evaluation team. Overall he feels great.  Denies SOB/PND/Orthopnea. Weight at home 157-159 pounds. He continues on Milrinone at 0.25 mcg via PICC.  [facility] following for home Milrinone and PICC. Compliant with medications.     Unable to tolerate ACE/ARB due to hypotension and severe hyperkalemia.     Review of Systems: All pertinent positives and negatives as in HPI, otherwise negative.     Past Medical History  Diagnosis Date  . Ischemic cardiomyopathy     a. 10/09 Echo: Sev LV Dysfxn, inf/lat AK, Mod MR.  [name];  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 Hypertension   . PVC (premature ventricular contraction)   . Pacemaker   . CHF (congestive heart failure)   . Shortness of breath     Current Outpatient Prescriptions  Medication Sig Dispense Refill  . amiodarone (PACERONE) 200 MG tablet Take 1 tablet (200 mg total) by mouth 2 (two) times daily.  60 tablet  3  . aspirin 325 MG tablet Take 325 mg by mouth daily.        . carvedilol (COREG) 3.125 MG tablet Take 1 tablet (3.125 mg total) by mouth 2 (two) times daily  with a meal.  60 tablet  3  . clopidogrel (PLAVIX) 75 MG tablet Take 1 tablet (75 mg total) by mouth daily.  90 tablet  3  . Coenzyme Q10 (CO Q-10 PO) Take 1 capsule by mouth daily.      [name] ezetimibe (ZETIA) 10 MG tablet Take 1 tablet (10 mg total) by mouth daily.  90 tablet  3  . furosemide (LASIX) 40 MG tablet Take 20 mg by mouth daily.       . hydrALAZINE (APRESOLINE) 25 MG tablet Take 0.5 tablets (12.5 mg total) by mouth 3 (three) times daily.  60 tablet  3  . isosorbide mononitrate (IMDUR) 30 MG 24 hr tablet Take 0.5 tablets (15 mg total) by mouth daily.  30 tablet  3  . levothyroxine (SYNTHROID, LEVOTHROID) 150 MCG tablet Take 150 mcg by mouth daily.      . milrinone (PRIMACOR) 20 MG/100ML SOLN infusion Inject 18.6 mcg/min into the vein  continuous.  100 mL  6  . nitroGLYCERIN (NITROSTAT) 0.4 MG SL tablet Place 0.4 mg under the tongue every 5 (five) minutes as needed. For chest pain      . potassium chloride SA (K-DUR,KLOR-CON) 20 MEQ tablet Take 1 tablet (20 mEq total) by mouth daily.  30 tablet  3  . rosuvastatin (CRESTOR) 20 MG tablet Take 1 tablet (20 mg total) by mouth daily.  90 tablet  3   No current facility-administered medications for this encounter.     Allergies  Allergen Reactions  . Diovan (Valsartan) Other (See Comments)    Hypotension at low dose, hyperkalemia  . Lipitor (Atorvastatin Calcium) Other (See Comments)    Muscle pain   PHYSICAL EXAM: Filed Vitals:   02/07/13 1036  BP: 114/74  Pulse: 69  Height: 5\' 7"  (1.702 m)  Weight: 161 lb 1.9 oz (73.084 kg)  SpO2: 96%    General: No respiratory difficulty Wife present  HEENT: normal Neck: supple.  JVD flat Carotids 2+ bilat; no bruits. No lymphadenopathy or thryomegaly appreciated. Cor: PMI laterally displaced. Regular rate & rhythm. No rubs, gallop.s 2/6 MR murmur Lungs: clear Abdomen: soft, nontender, nondistended. No hepatosplenomegaly. No bruits or masses. Good bowel sounds. Extremities: no cyanosis, clubbing, rash, edema.  RUE PICC Neuro: alert & oriented x 3, cranial nerves grossly intact. moves all 4 extremities w/o difficulty. Affect pleasant.     ASSESSMENT & PLAN:

## 2013-02-07 NOTE — Patient Instructions (Addendum)
Follow up in 4-6 weeks   Do the following things EVERYDAY: 1) Weigh yourself in the morning before breakfast. Write it down and keep it in a log. 2) Take your medicines as prescribed 3) Eat low salt foods-Limit salt (sodium) to 2000 mg per day.  4) Stay as active as you can everyday 5) Limit all fluids for the day to less than 2 liters

## 2013-02-08 ENCOUNTER — Encounter: Payer: Self-pay | Admitting: Internal Medicine

## 2013-02-10 ENCOUNTER — Ambulatory Visit: Payer: Medicare Other | Admitting: Cardiology

## 2013-02-13 ENCOUNTER — Telehealth (HOSPITAL_COMMUNITY): Payer: Self-pay | Admitting: *Deleted

## 2013-02-13 DIAGNOSIS — Z7682 Awaiting organ transplant status: Secondary | ICD-10-CM

## 2013-02-13 DIAGNOSIS — R109 Unspecified abdominal pain: Secondary | ICD-10-CM

## 2013-02-13 DIAGNOSIS — I5022 Chronic systolic (congestive) heart failure: Secondary | ICD-10-CM

## 2013-02-13 DIAGNOSIS — Z01818 Encounter for other preprocedural examination: Secondary | ICD-10-CM

## 2013-02-14 NOTE — Telephone Encounter (Signed)
Per Dr Haroldine Laws pt needs abd u/s as part of his transplant work up, sch for Fri 6/20 at 8am pt aware

## 2013-02-16 ENCOUNTER — Other Ambulatory Visit (HOSPITAL_COMMUNITY): Payer: Self-pay | Admitting: *Deleted

## 2013-02-16 MED ORDER — FUROSEMIDE 40 MG PO TABS: 20.0000 mg | ORAL_TABLET | Freq: Every day | ORAL | Status: DC

## 2013-02-17 ENCOUNTER — Encounter: Payer: Self-pay | Admitting: Internal Medicine

## 2013-02-17 ENCOUNTER — Ambulatory Visit (HOSPITAL_COMMUNITY)
Admission: RE | Admit: 2013-02-17 | Discharge: 2013-02-17 | Disposition: A | Discharge status: Home / Self Care | Payer: Medicare Other | Source: Ambulatory Visit | Attending: Internal Medicine | Admitting: Internal Medicine

## 2013-02-17 DIAGNOSIS — K8020 Calculus of gallbladder without cholecystitis without obstruction: Secondary | ICD-10-CM | POA: Insufficient documentation

## 2013-02-17 DIAGNOSIS — R109 Unspecified abdominal pain: Secondary | ICD-10-CM

## 2013-02-17 DIAGNOSIS — Q619 Cystic kidney disease, unspecified: Secondary | ICD-10-CM | POA: Insufficient documentation

## 2013-02-17 DIAGNOSIS — I5022 Chronic systolic (congestive) heart failure: Secondary | ICD-10-CM

## 2013-02-23 ENCOUNTER — Encounter: Payer: Self-pay | Admitting: Internal Medicine

## 2013-02-27 ENCOUNTER — Telehealth (HOSPITAL_COMMUNITY): Payer: Self-pay | Admitting: *Deleted

## 2013-02-27 MED ORDER — FUROSEMIDE 40 MG PO TABS: 20.0000 mg | ORAL_TABLET | ORAL | Status: DC | PRN

## 2013-02-27 MED ORDER — POTASSIUM CHLORIDE CRYS ER 20 MEQ PO TBCR: 20.0000 meq | EXTENDED_RELEASE_TABLET | ORAL | Status: DC | PRN

## 2013-02-27 NOTE — Telephone Encounter (Signed)
Received labs from [place] that were drawn 6/27 bun 25 cr 1.60 K 4.1, per Darrick Grinder, NP if wt stable change Lasix to prn, take if wt 165 or greater and stop KCL, pt is aware and verbalizesunderstanding

## 2013-03-09 ENCOUNTER — Encounter: Payer: Self-pay | Admitting: Internal Medicine

## 2013-03-09 ENCOUNTER — Ambulatory Visit (HOSPITAL_COMMUNITY)
Admission: RE | Admit: 2013-03-09 | Discharge: 2013-03-09 | Disposition: A | Discharge status: Home / Self Care | Payer: Medicare Other | Source: Ambulatory Visit | Attending: Internal Medicine | Admitting: Internal Medicine

## 2013-03-09 VITALS — BP 118/72 | HR 65 | Wt 163.8 lb

## 2013-03-09 DIAGNOSIS — I2582 Chronic total occlusion of coronary artery: Secondary | ICD-10-CM | POA: Insufficient documentation

## 2013-03-09 DIAGNOSIS — Z7902 Long term (current) use of antithrombotics/antiplatelets: Secondary | ICD-10-CM | POA: Insufficient documentation

## 2013-03-09 DIAGNOSIS — Z9861 Coronary angioplasty status: Secondary | ICD-10-CM | POA: Insufficient documentation

## 2013-03-09 DIAGNOSIS — Z79899 Other long term (current) drug therapy: Secondary | ICD-10-CM | POA: Insufficient documentation

## 2013-03-09 DIAGNOSIS — I2510 Atherosclerotic heart disease of native coronary artery without angina pectoris: Secondary | ICD-10-CM | POA: Insufficient documentation

## 2013-03-09 DIAGNOSIS — I252 Old myocardial infarction: Secondary | ICD-10-CM | POA: Insufficient documentation

## 2013-03-09 DIAGNOSIS — Z96659 Presence of unspecified artificial knee joint: Secondary | ICD-10-CM | POA: Insufficient documentation

## 2013-03-09 DIAGNOSIS — I5022 Chronic systolic (congestive) heart failure: Secondary | ICD-10-CM | POA: Insufficient documentation

## 2013-03-09 DIAGNOSIS — E785 Hyperlipidemia, unspecified: Secondary | ICD-10-CM | POA: Insufficient documentation

## 2013-03-09 DIAGNOSIS — E039 Hypothyroidism, unspecified: Secondary | ICD-10-CM | POA: Insufficient documentation

## 2013-03-09 DIAGNOSIS — M129 Arthropathy, unspecified: Secondary | ICD-10-CM | POA: Insufficient documentation

## 2013-03-09 DIAGNOSIS — Z7982 Long term (current) use of aspirin: Secondary | ICD-10-CM | POA: Insufficient documentation

## 2013-03-09 NOTE — Progress Notes (Signed)
Patient ID: Johnny Oneill, male   DOB: 11-09-1940, 72 y.o.   MRN: [id number] PCP: Dr Laurann Montana Cardiologist: Dr Martinique  HPI: Johnny Oneill is a 72 year old male with history of  CAD s/p stent to LCX 2004 with re-occlusion of LCX with lateral MI 2009 and repeat stenting, total occlusion of RCA, chronic systolic heart failure EF 35%, ICD 2009, PVCs, hypothyroidism, hyperlipidemia, and arthritis. S/P L TKR 2013. Last Cath 2009.   10/2012 ECHO EF 25-30%. Mild to mod Johnny. RV ok.   11/08/2012 Myoview- Large scar present with prior MI    11/14/12 CPX Peak VO2: 16.2 ml/kg/min  predicted peak VO2: 58.4% VE/VCO2 slope: 46. OUES: 1.48 Peak RER: 1.15 Ventilatory Threshold: 10.2 % predicted peak VO2: 36.8%  RHC/LHC 12/01/12  RA 19/15, mean of 13 mm Hg  RV 65/10 mm Hg  PA 59/25, mean of 37 mm Hg  PCWP 26/24, mean of 20 mm Hg  LV 116/26 mm Hg  AO 125/88 mean of 106 mm Hg  Oxygen saturations:  PA 51%  AO 94%  Cardiac Output (Fick) 2.8 L/min , Thermo-3 L/min  Cardiac Index (Fick) 1.5 L/min/m2, Thermo 1.6 L/min/m2  Coronary angiography:  Coronary dominance: right  Left mainstem: less than 10% disease  Left anterior descending (LAD): Heavily calcified with 40% segmental disease in the proximal vessel. Diffuse irregularities.  Left circumflex (LCx): Widely patent stents in the proximal and mid vessel. Less than 20% disease.  Right coronary artery (RCA): Occluded proximally. Left to right collaterals to the RCA   Admitted 12/07/12 with cardiogenic shock requiring milrinone initiation at 0.25 mcg/kg/min.  Had PICC line placed and is followed by [facility].  Discharge weight 157 pounds.    He returns for follow up with his wife. He is currently on heart transplant list 02/27/13 at [facility] Status Ib. Plavix has been stopped in anticipation of heart transplant.  On 6/30 he was instructed to take lasix as needed for weight of 165 pounds or greater.  Overall he is feeling great. Denies SOB/PND/Orthopnea. Weight at home 158-161  pounds.He has only had 10 mg of lasix for 2 pound weight gain.  He continues on Milrinone at 0.25 mcg via PICC.  [facility] following for home Milrinone and PICC. Compliant with medications.   03/06/13 Creatinine 1.5 Potassium 4.4   Unable to tolerate ACE/ARB due to hypotension and severe hyperkalemia.     Review of Systems: All pertinent positives and negatives as in HPI, otherwise negative.     Past Medical History  Diagnosis Date  . Ischemic cardiomyopathy     a. 10/09 Echo: Sev LV Dysfxn, inf/lat AK, Mod Johnny.  [name];  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 Hypertension   . PVC (premature ventricular contraction)   . Pacemaker   . CHF (congestive heart failure)   . Shortness of breath     Current Outpatient Prescriptions  Medication Sig Dispense Refill  . amiodarone (PACERONE) 200 MG tablet Take 1 tablet (200 mg total) by mouth 2 (two) times  daily.  60 tablet  3  . aspirin 325 MG tablet Take 325 mg by mouth daily.        . carvedilol (COREG) 3.125 MG tablet Take 1 tablet (3.125 mg total) by mouth 2 (two) times daily with a meal.  60 tablet  3  . clopidogrel (PLAVIX) 75 MG tablet Take 1 tablet (75 mg total) by mouth daily.  90 tablet  3  . Coenzyme Q10 (CO Q-10 PO) Take 1 capsule by mouth daily.      [name] ezetimibe (ZETIA) 10 MG tablet Take 1 tablet (10 mg total) by mouth daily.  90 tablet  3  . furosemide (LASIX) 40 MG tablet Take 0.5 tablets (20 mg total) by mouth as needed. For wt of 165 lb or greater  30 tablet  6  . hydrALAZINE (APRESOLINE) 25 MG tablet Take 0.5 tablets (12.5 mg total) by mouth 3 (three) times daily.  60 tablet  3  . isosorbide  mononitrate (IMDUR) 30 MG 24 hr tablet Take 0.5 tablets (15 mg total) by mouth daily.  30 tablet  3  . levothyroxine (SYNTHROID, LEVOTHROID) 150 MCG tablet Take 150 mcg by mouth daily.      . milrinone (PRIMACOR) 20 MG/100ML SOLN infusion Inject 18.6 mcg/min into the vein continuous.  100 mL  6  . nitroGLYCERIN (NITROSTAT) 0.4 MG SL tablet Place 0.4 mg under the tongue every 5 (five) minutes as needed. For chest pain      . potassium chloride SA (K-DUR,KLOR-CON) 20 MEQ tablet Take 1 tablet (20 mEq total) by mouth as needed. Take only when you take Furosemide  30 tablet  3  . rosuvastatin (CRESTOR) 20 MG tablet Take 1 tablet (20 mg total) by mouth daily.  90 tablet  3   No current facility-administered medications for this encounter.     Allergies  Allergen Reactions  . Diovan (Valsartan) Other (See Comments)    Hypotension at low dose, hyperkalemia  . Lipitor (Atorvastatin Calcium) Other (See Comments)    Muscle pain   PHYSICAL EXAM: Filed Vitals:   03/09/13 1050  BP: 118/72  Pulse: 65  Weight: 163 lb 12 oz (74.277 kg)  SpO2: 95%    General: No respiratory difficulty Wife present  HEENT: normal Neck: supple.  JVD flat Carotids 2+ bilat; no bruits. No lymphadenopathy or thryomegaly appreciated. Cor: PMI laterally displaced. Regular rate & rhythm. No rubs, gallop.s 2/6 Johnny murmur Lungs: clear Abdomen: soft, nontender, nondistended. No hepatosplenomegaly. No bruits or masses. Good bowel sounds. Extremities: no cyanosis, clubbing, rash, edema.  RUE PICC Neuro: alert & oriented x 3, cranial nerves grossly intact. moves all 4 extremities w/o difficulty. Affect pleasant.     ASSESSMENT & PLAN:

## 2013-03-09 NOTE — Assessment & Plan Note (Signed)
Continues to do very well on milrinone. On transplant list Ib. Will continue current HF regimen. They know to call with any change in condition. Reinforced need for daily weights and reviewed use of sliding scale diuretics.

## 2013-03-09 NOTE — Patient Instructions (Addendum)
We will contact you in 4 months to schedule your next appointment.  

## 2013-03-09 NOTE — Addendum Note (Signed)
Encounter addended by: Scarlette Calico, RN on: 03/09/2013 11:58 AM<BR>     Documentation filed: Patient Instructions Section

## 2013-03-14 ENCOUNTER — Other Ambulatory Visit (INDEPENDENT_AMBULATORY_CARE_PROVIDER_SITE_OTHER): Payer: Medicare Other

## 2013-03-14 ENCOUNTER — Other Ambulatory Visit: Payer: Self-pay | Admitting: *Deleted

## 2013-03-14 DIAGNOSIS — E039 Hypothyroidism, unspecified: Secondary | ICD-10-CM

## 2013-03-14 LAB — T4: T4, Total: 11.2 ug/dL (ref 5.0–12.5)

## 2013-03-14 LAB — TSH: TSH: 1.71 u[IU]/mL (ref 0.35–5.50)

## 2013-03-16 ENCOUNTER — Ambulatory Visit (INDEPENDENT_AMBULATORY_CARE_PROVIDER_SITE_OTHER): Payer: Medicare Other | Admitting: Endocrinology

## 2013-03-16 ENCOUNTER — Encounter: Payer: Self-pay | Admitting: Endocrinology

## 2013-03-16 ENCOUNTER — Encounter: Payer: Self-pay | Admitting: Internal Medicine

## 2013-03-16 ENCOUNTER — Other Ambulatory Visit: Payer: Self-pay | Admitting: *Deleted

## 2013-03-16 VITALS — BP 124/72 | HR 81 | Temp 97.7°F | Resp 12 | Ht 68.0 in | Wt 163.7 lb

## 2013-03-16 DIAGNOSIS — E890 Postprocedural hypothyroidism: Secondary | ICD-10-CM

## 2013-03-16 MED ORDER — LEVOTHYROXINE SODIUM 150 MCG PO TABS: ORAL_TABLET | ORAL | Status: DC

## 2013-03-16 NOTE — Progress Notes (Signed)
Patient ID: Johnny Oneill, male   DOB: [birth year], 72 y.o.   MRN: [id number]  Reason for Appointment:  Hypothyroidism, followup visit    History of Present Illness:   The hypothyroidism was first diagnosed  after radioactive iodine treatment for [name] several years ago  Complaints are reported by the patient now are none. He is generally having no unusual fatigue although has had other problems including CHF leading to some exercise intolerance.  No significant change in his weight          The treatments that the patient has taken include Synthroid and he takes an extra half tablet on Fridays as directed.      The response to therapy has been  good then he has had fairly stable levels with the same dose for quite some time           Compliance with the medical regimen has been as prescribed with taking the tablet in the morning before breakfast.  Appointment on 03/14/2013  Component Date Value Range Status  . T4, Total 03/14/2013 11.2  5.0 - 12.5 ug/dL Final  . TSH 03/14/2013 1.71  0.35 - 5.50 uIU/mL Final      Medication List       This list is accurate as of: 03/16/13  9:23 AM.  Always use your most recent med list.               amiodarone 200 MG tablet  Commonly known as:  PACERONE  Take 1 tablet (200 mg total) by mouth 2 (two) times daily.     aspirin 325 MG tablet  Take 325 mg by mouth daily.     carvedilol 3.125 MG tablet  Commonly known as:  COREG  Take 1 tablet (3.125 mg total) by mouth 2 (two) times daily with a meal.     clopidogrel 75 MG tablet  Commonly known as:  PLAVIX  Take 1 tablet (75 mg total) by mouth daily.     CO Q-10 PO  Take 1 capsule by mouth daily.     ezetimibe 10 MG tablet  Commonly known as:  ZETIA  Take 1 tablet (10 mg total) by mouth daily.     furosemide 40 MG tablet  Commonly known as:  LASIX  Take 0.5 tablets (20 mg total) by mouth as needed. For wt of 165 lb or greater     hydrALAZINE 25 MG tablet  Commonly  known as:  APRESOLINE  Take 0.5 tablets (12.5 mg total) by mouth 3 (three) times daily.     isosorbide mononitrate 30 MG 24 hr tablet  Commonly known as:  IMDUR  Take 0.5 tablets (15 mg total) by mouth daily.     levothyroxine 150 MCG tablet  Commonly known as:  SYNTHROID, LEVOTHROID  Take 150 mcg by mouth daily.     milrinone 20 MG/100ML Soln infusion  Commonly known as:  PRIMACOR  Inject 18.6 mcg/min into the vein continuous.     nitroGLYCERIN 0.4 MG SL tablet  Commonly known as:  NITROSTAT  Place 0.4 mg under the tongue every 5 (five) minutes as needed. For chest pain     potassium chloride SA 20 MEQ tablet  Commonly known as:  K-DUR,KLOR-CON  Take 1 tablet (20 mEq total) by mouth as needed. Take only when you take Furosemide     rosuvastatin 20 MG tablet  Commonly known as:  CRESTOR  Take 1 tablet (20 mg total) by mouth daily.  Past Medical History  Diagnosis Date  . Ischemic cardiomyopathy     a. 10/09 Echo: Sev LV Dysfxn, inf/lat AK, Mod MR.  [name];  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 Hypertension   . PVC (premature ventricular contraction)   . Pacemaker   . CHF (congestive heart failure)   . Shortness of breath     Past Surgical History  Procedure Laterality Date  . Defibrillator  2009  . Total knee arthroplasty  11/27/11    left  . Insert / replace / remove pacemaker  2009  . Coronary angioplasty with stent placement  2004  . Coronary angioplasty with stent placement  07/2008  . Knee arthroscopy w/ partial medial meniscectomy  12/2002    left  . Knee arthroscopy      bilaterally  . Total knee  arthroplasty  11/27/2011    Procedure: TOTAL KNEE ARTHROPLASTY;  Surgeon: Yvette Rack., MD;  Location: [facility];  Service: Orthopedics;  Laterality: Left;  left total knee arthroplasty    Family History  Problem Relation Age of Onset  . Heart failure Father   . Stroke Father   . Coronary artery disease Mother   . Heart disease Mother   . Hyperlipidemia Sister     Social History:  reports that he has never smoked. He has never used smokeless tobacco. He reports that  drinks alcohol. He reports that he does not use illicit drugs.  Allergies:  Allergies  Allergen Reactions  . Diovan (Valsartan) Other (See Comments)    Hypotension at low dose, hyperkalemia  . Lipitor (Atorvastatin Calcium) Other (See Comments)    Muscle pain     Examination:   BP 124/72  Pulse 81  Temp(Src) 97.7 F (36.5 C)  Resp 12  Ht 5\' 8"  (1.727 m)  Wt 163 lb 11.2 oz (74.254 kg)  BMI 24.9 kg/m2  SpO2 95%   GENERAL APPEARANCE: Alert And looks well.    FACE: No puffiness of face or periorbital edema.         NECK: no palpable abnormality in thyroid bed         NEUROLOGIC EXAM: DTRs 2+ bilaterally at biceps.    Assessments 1. Hypothyroidism - 244.9   He has adequate replacement of his thyroid with current regimen and he is quite compliant with it. He is aware of when to take his thyroid supplement and to avoid any interaction substances. He'll continue to followup annually   Treatment:   Continue same dosage before breakfast daily. Avoid taking any calcium or iron supplements with the thyroid supplement.    Michel Eskelson 03/16/2013, 9:23 AM

## 2013-03-16 NOTE — Patient Instructions (Addendum)
Same dose 

## 2013-03-17 DIAGNOSIS — E890 Postprocedural hypothyroidism: Secondary | ICD-10-CM | POA: Insufficient documentation

## 2013-03-22 ENCOUNTER — Ambulatory Visit: Payer: Medicare Other | Admitting: Cardiology

## 2013-03-30 ENCOUNTER — Telehealth (HOSPITAL_COMMUNITY): Payer: Self-pay | Admitting: *Deleted

## 2013-03-30 ENCOUNTER — Other Ambulatory Visit (HOSPITAL_COMMUNITY): Payer: Self-pay | Admitting: Adult Health

## 2013-03-30 ENCOUNTER — Encounter: Payer: Self-pay | Admitting: Cardiology

## 2013-03-30 MED ORDER — HYDRALAZINE HCL 25 MG PO TABS: ORAL_TABLET | ORAL | Status: DC

## 2013-03-30 NOTE — Telephone Encounter (Signed)
Wife called needing refill sent to [facility]

## 2013-04-03 ENCOUNTER — Encounter: Payer: Self-pay | Admitting: Internal Medicine

## 2013-04-09 DIAGNOSIS — I2510 Atherosclerotic heart disease of native coronary artery without angina pectoris: Secondary | ICD-10-CM

## 2013-04-09 DIAGNOSIS — I10 Essential (primary) hypertension: Secondary | ICD-10-CM

## 2013-04-09 DIAGNOSIS — I509 Heart failure, unspecified: Secondary | ICD-10-CM

## 2013-04-09 DIAGNOSIS — I5023 Acute on chronic systolic (congestive) heart failure: Secondary | ICD-10-CM

## 2013-04-10 ENCOUNTER — Other Ambulatory Visit (HOSPITAL_COMMUNITY): Payer: Self-pay | Admitting: Adult Health

## 2013-04-17 ENCOUNTER — Encounter: Payer: Self-pay | Admitting: Internal Medicine

## 2013-04-18 ENCOUNTER — Encounter: Payer: Self-pay | Admitting: Internal Medicine

## 2013-04-21 ENCOUNTER — Encounter: Payer: Self-pay | Admitting: Internal Medicine

## 2013-04-24 ENCOUNTER — Encounter: Payer: Self-pay | Admitting: Internal Medicine

## 2013-05-05 ENCOUNTER — Encounter: Payer: Self-pay | Admitting: Internal Medicine

## 2013-05-05 ENCOUNTER — Telehealth (HOSPITAL_COMMUNITY): Payer: Self-pay | Admitting: Cardiology

## 2013-05-05 NOTE — Telephone Encounter (Signed)
Wife wanted to call with mildly elevated b/p readings 130-142/70-78 This is elevated for the pt.  States they were instructed to call with a few reading Pt states he feels fine

## 2013-05-08 ENCOUNTER — Telehealth: Payer: Self-pay | Admitting: *Deleted

## 2013-05-08 DIAGNOSIS — I10 Essential (primary) hypertension: Secondary | ICD-10-CM

## 2013-05-08 DIAGNOSIS — I2510 Atherosclerotic heart disease of native coronary artery without angina pectoris: Secondary | ICD-10-CM

## 2013-05-08 MED ORDER — HYDRALAZINE HCL 25 MG PO TABS: 25.0000 mg | ORAL_TABLET | Freq: Three times a day (TID) | ORAL | Status: DC

## 2013-05-08 NOTE — Telephone Encounter (Signed)
Increase hydralazine to 25 tid. Continue to monitor BP.

## 2013-05-08 NOTE — Telephone Encounter (Signed)
BP 141/92 today pt asymptomatic, will send to Dr Haroldine Laws for review

## 2013-05-08 NOTE — Telephone Encounter (Signed)
Pt's wife called to report elevated BP's 140's/80 - 90; per Dr. Haroldine Laws instructed wife to increase pt's Hydralazine to 25 mg tid and continue to monitor BP's. Wife verbalized understanding of same.  Spoke with patient - he denies any other symptoms, states energy has been better since starting Milrinone, is now working all week and attending weekend activities. Admits to missing a noon dose of his hydralazine 12.5 due to work schedule. Instructed to increase his hydralazine to 25 mg tid and continue to monitor BP. Advised pt to call if BP remains elevated or he develops any other symptoms. Pt verbalized understanding of same.

## 2013-05-09 NOTE — Telephone Encounter (Signed)
Pt aware see phone note dated 9/8

## 2013-05-10 ENCOUNTER — Other Ambulatory Visit (HOSPITAL_COMMUNITY): Payer: Self-pay | Admitting: *Deleted

## 2013-05-10 DIAGNOSIS — I509 Heart failure, unspecified: Secondary | ICD-10-CM

## 2013-05-10 DIAGNOSIS — I499 Cardiac arrhythmia, unspecified: Secondary | ICD-10-CM

## 2013-05-10 MED ORDER — AMIODARONE HCL 200 MG PO TABS: 200.0000 mg | ORAL_TABLET | Freq: Two times a day (BID) | ORAL | Status: DC

## 2013-05-11 ENCOUNTER — Telehealth (HOSPITAL_COMMUNITY): Payer: Self-pay | Admitting: *Deleted

## 2013-05-11 ENCOUNTER — Other Ambulatory Visit (HOSPITAL_COMMUNITY): Payer: Self-pay | Admitting: *Deleted

## 2013-05-11 DIAGNOSIS — I509 Heart failure, unspecified: Secondary | ICD-10-CM

## 2013-05-11 DIAGNOSIS — I499 Cardiac arrhythmia, unspecified: Secondary | ICD-10-CM

## 2013-05-11 MED ORDER — AMIODARONE HCL 200 MG PO TABS: 200.0000 mg | ORAL_TABLET | Freq: Two times a day (BID) | ORAL | Status: DC

## 2013-05-11 NOTE — Telephone Encounter (Signed)
Received labs from [facility] K 3.3 per Junie Bame take extra 40 meq for 2 days pt's wife aware and agreeable. Mag level 1.9 per Junie Bame, NP will monitor if trneds down will start mag ox

## 2013-05-26 ENCOUNTER — Encounter: Payer: Self-pay | Admitting: Internal Medicine

## 2013-06-01 ENCOUNTER — Encounter: Payer: Self-pay | Admitting: Internal Medicine

## 2013-06-02 ENCOUNTER — Encounter: Payer: Self-pay | Admitting: Internal Medicine

## 2013-06-04 ENCOUNTER — Telehealth: Payer: Self-pay | Admitting: *Deleted

## 2013-06-04 DIAGNOSIS — R112 Nausea with vomiting, unspecified: Secondary | ICD-10-CM

## 2013-06-04 MED ORDER — PROMETHAZINE HCL 25 MG PO TABS: 25.0000 mg | ORAL_TABLET | Freq: Four times a day (QID) | ORAL | Status: DC | PRN

## 2013-06-04 NOTE — Telephone Encounter (Signed)
Wife called VAD beeper to report patient vomiting this morning; VS stable, denies fever/chills; he does not feel bad enough to go to ED. Pt did have flu shot last Friday. Wife is asking for phenergan. Called wife back per Dr. Haroldine Laws, will send Rx to local pharmacy for prn phenergan. Instructed wife to call if symptoms worsen or do not improve.

## 2013-06-08 DIAGNOSIS — I2510 Atherosclerotic heart disease of native coronary artery without angina pectoris: Secondary | ICD-10-CM

## 2013-06-08 DIAGNOSIS — I509 Heart failure, unspecified: Secondary | ICD-10-CM

## 2013-06-08 DIAGNOSIS — I10 Essential (primary) hypertension: Secondary | ICD-10-CM

## 2013-06-12 ENCOUNTER — Telehealth (HOSPITAL_COMMUNITY): Payer: Self-pay | Admitting: Adult Health

## 2013-06-12 NOTE — Telephone Encounter (Signed)
Spoke to Johnny Oneill regarding lab work. K 3.2  He says that he had nausea and vomiting for 4 days last week with a poor appetite. He says he is finally feeling better and able to eat and drink liquids. Instructed to take an additional 40 meq potassium today.  Plan to repeat BMET Thursday.   Violette Morneault 3:28 PM

## 2013-06-23 ENCOUNTER — Encounter: Payer: Self-pay | Admitting: Internal Medicine

## 2013-06-26 ENCOUNTER — Telehealth (HOSPITAL_COMMUNITY): Payer: Self-pay | Admitting: Cardiology

## 2013-06-26 DIAGNOSIS — I5023 Acute on chronic systolic (congestive) heart failure: Secondary | ICD-10-CM

## 2013-06-26 MED ORDER — CARVEDILOL 3.125 MG PO TABS: 3.1250 mg | ORAL_TABLET | Freq: Two times a day (BID) | ORAL | Status: DC

## 2013-06-26 NOTE — Telephone Encounter (Signed)
pts wife called to request refill Requested Prescriptions   Signed Prescriptions Disp Refills  . carvedilol (COREG) 3.125 MG tablet 60 tablet 6    Sig: Take 1 tablet (3.125 mg total) by mouth 2 (two) times daily with a meal.    Authorizing Provider: Jolaine Artist    Ordering User: JEFFRIES, Sharlot Gowda

## 2013-07-06 ENCOUNTER — Telehealth: Payer: Self-pay | Admitting: Cardiology

## 2013-07-06 ENCOUNTER — Other Ambulatory Visit: Payer: Self-pay

## 2013-07-06 ENCOUNTER — Encounter: Payer: Self-pay | Admitting: Internal Medicine

## 2013-07-06 NOTE — Telephone Encounter (Signed)
New Problem  Pt wife called states that he is in heart faliure//She asks will he still need his hear monitor and if not should she return it//

## 2013-07-06 NOTE — Telephone Encounter (Signed)
Returned call to patient's wife she stated patient has been seeing Dr.Bensimhon and he is on heart transplant list.Stated she wanted to know what to do with Medtronic pacemaker phone device that it is not needed any longer.Message sent to device nurse.

## 2013-07-07 ENCOUNTER — Telehealth: Payer: Self-pay | Admitting: Cardiology

## 2013-07-08 ENCOUNTER — Other Ambulatory Visit (HOSPITAL_COMMUNITY): Payer: Self-pay | Admitting: Adult Health

## 2013-07-13 ENCOUNTER — Other Ambulatory Visit (HOSPITAL_COMMUNITY): Payer: Self-pay | Admitting: Cardiology

## 2013-07-13 DIAGNOSIS — I5022 Chronic systolic (congestive) heart failure: Secondary | ICD-10-CM

## 2013-07-13 MED ORDER — ISOSORBIDE MONONITRATE ER 30 MG PO TB24: 15.0000 mg | ORAL_TABLET | Freq: Every day | ORAL | Status: DC

## 2013-07-13 NOTE — Telephone Encounter (Signed)
Requested Prescriptions   Signed Prescriptions Disp Refills  . isosorbide mononitrate (IMDUR) 30 MG 24 hr tablet 45 tablet 3    Sig: Take 0.5 tablets (15 mg total) by mouth daily.    Authorizing Provider: Jolaine Artist    Ordering User: Salma Walrond, Sharlot Gowda

## 2013-07-24 ENCOUNTER — Telehealth (HOSPITAL_COMMUNITY): Payer: Self-pay | Admitting: Cardiology

## 2013-07-24 ENCOUNTER — Ambulatory Visit (HOSPITAL_COMMUNITY)
Admission: RE | Admit: 2013-07-24 | Discharge: 2013-07-24 | Disposition: A | Discharge status: Home / Self Care | Payer: Medicare Other | Source: Ambulatory Visit | Attending: Internal Medicine | Admitting: Internal Medicine

## 2013-07-24 VITALS — BP 110/64 | HR 87 | Wt 166.0 lb

## 2013-07-24 DIAGNOSIS — I2510 Atherosclerotic heart disease of native coronary artery without angina pectoris: Secondary | ICD-10-CM | POA: Insufficient documentation

## 2013-07-24 DIAGNOSIS — E039 Hypothyroidism, unspecified: Secondary | ICD-10-CM | POA: Insufficient documentation

## 2013-07-24 DIAGNOSIS — Z95810 Presence of automatic (implantable) cardiac defibrillator: Secondary | ICD-10-CM | POA: Insufficient documentation

## 2013-07-24 DIAGNOSIS — Z950 Presence of cardiac pacemaker: Secondary | ICD-10-CM | POA: Insufficient documentation

## 2013-07-24 DIAGNOSIS — I5022 Chronic systolic (congestive) heart failure: Secondary | ICD-10-CM

## 2013-07-24 DIAGNOSIS — I509 Heart failure, unspecified: Secondary | ICD-10-CM | POA: Insufficient documentation

## 2013-07-24 DIAGNOSIS — I2589 Other forms of chronic ischemic heart disease: Secondary | ICD-10-CM | POA: Insufficient documentation

## 2013-07-24 DIAGNOSIS — I10 Essential (primary) hypertension: Secondary | ICD-10-CM | POA: Insufficient documentation

## 2013-07-24 DIAGNOSIS — J019 Acute sinusitis, unspecified: Secondary | ICD-10-CM | POA: Insufficient documentation

## 2013-07-24 DIAGNOSIS — E7800 Pure hypercholesterolemia, unspecified: Secondary | ICD-10-CM | POA: Insufficient documentation

## 2013-07-24 DIAGNOSIS — I252 Old myocardial infarction: Secondary | ICD-10-CM | POA: Insufficient documentation

## 2013-07-24 MED ORDER — AMOXICILLIN-POT CLAVULANATE 875-125 MG PO TABS: 1.0000 | ORAL_TABLET | Freq: Two times a day (BID) | ORAL | Status: DC

## 2013-07-24 NOTE — Progress Notes (Signed)
Patient ID: Johnny Oneill, male   DOB: Feb 22, 1941, 72 y.o.   MRN: [id number] PCP: Dr Laurann Montana Cardiologist: Dr Martinique  HPI: Johnny Oneill is a 72 year old male with history of  CAD s/p stent to LCX 2004 with re-occlusion of LCX with lateral MI 2009 and repeat stenting, total occlusion of RCA, chronic systolic heart failure EF 35%, ICD 2009, PVCs, hypothyroidism, hyperlipidemia, and arthritis. S/P L TKR 2013. Last Cath 2009.   10/2012 ECHO EF 25-30%. Mild to mod Johnny. RV ok.   11/08/2012 Myoview- Large scar present with prior MI    11/14/12 CPX Peak VO2: 16.2 ml/kg/min  predicted peak VO2: 58.4% VE/VCO2 slope: 46. OUES: 1.48 Peak RER: 1.15 Ventilatory Threshold: 10.2 % predicted peak VO2: 36.8%  RHC/LHC 12/01/12  RA 19/15, mean of 13 mm Hg  RV 65/10 mm Hg  PA 59/25, mean of 37 mm Hg  PCWP 26/24, mean of 20 mm Hg  LV 116/26 mm Hg  AO 125/88 mean of 106 mm Hg  Oxygen saturations:  PA 51%  AO 94%  Cardiac Output (Fick) 2.8 L/min , Thermo-3 L/min  Cardiac Index (Fick) 1.5 L/min/m2, Thermo 1.6 L/min/m2  Coronary angiography:  Coronary dominance: right  Left mainstem: less than 10% disease  Left anterior descending (LAD): Heavily calcified with 40% segmental disease in the proximal vessel. Diffuse irregularities.  Left circumflex (LCx): Widely patent stents in the proximal and mid vessel. Less than 20% disease.  Right coronary artery (RCA): Occluded proximally. Left to right collaterals to the RCA   Admitted 12/07/12 with cardiogenic shock requiring milrinone initiation at 0.25 mcg/kg/min.  Had PICC line placed and is followed by [facility].  Discharge weight 157 pounds.    He returns for an acute work in due to increased fatigue.  Complains of dry hacking cough, chills, and nasal drainage. Had advil cold and sinus for the last 3 days. No dysuria. He had 20 mg lasix yesterday and 40 mg today. He has not had lasix in the 3 months. Weight at home 162-163 pounds. Mild dyspnea with exertion. Denies  PND/Orthopnea. Plan for RHC next week at [facility].   He continues on Milrinone at 0.25 mcg via PICC.  [facility] following for home Milrinone and PICC. Compliant with medications.  03/06/13 Creatinine 1.5 Potassium 4.4 07/21/13 Creatinine 1.3 K 4.4 Magnesium 1.8   Unable to tolerate ACE/ARB due to hypotension and severe hyperkalemia.     Review of Systems: All pertinent positives and negatives as in HPI, otherwise negative.     Past Medical History  Diagnosis Date  . Ischemic cardiomyopathy     a. 10/09 Echo: Sev LV Dysfxn, inf/lat AK, Mod Johnny.  [name];  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 Hypertension   . PVC (premature ventricular contraction)   . Pacemaker   . CHF (congestive heart failure)   . Shortness of breath     Current Outpatient Prescriptions  Medication Sig Dispense Refill  . amiodarone (PACERONE) 200 MG tablet Take 1 tablet (200 mg total)  by mouth 2 (two) times daily.  60 tablet  6  . aspirin 325 MG tablet Take 325 mg by mouth daily.        . carvedilol (COREG) 3.125 MG tablet Take 1 tablet (3.125 mg total) by mouth 2 (two) times daily with a meal.  60 tablet  6  . Coenzyme Q10 (CO Q-10 PO) Take 1 capsule by mouth daily.      [name] ezetimibe (ZETIA) 10 MG tablet Take 1 tablet (10 mg total) by mouth daily.  90 tablet  3  . furosemide (LASIX) 40 MG tablet Take 0.5 tablets (20 mg total) by mouth as needed. For wt of 165 lb or greater  30 tablet  6  . hydrALAZINE (APRESOLINE) 25 MG tablet Take 1 tablet (25 mg total) by mouth 3 (three) times daily.  90 tablet  3  . isosorbide mononitrate (IMDUR) 30 MG 24 hr tablet Take 0.5 tablets (15 mg total) by  mouth daily.  45 tablet  3  . levothyroxine (SYNTHROID, LEVOTHROID) 150 MCG tablet Take 1 tablet daily on an empty stomach and 1/2 tablet on Friday as directed.  85 tablet  2  . milrinone (PRIMACOR) 20 MG/100ML SOLN infusion Inject 18.6 mcg/min into the vein continuous.  100 mL  6  . nitroGLYCERIN (NITROSTAT) 0.4 MG SL tablet Place 0.4 mg under the tongue every 5 (five) minutes as needed. For chest pain      . potassium chloride SA (K-DUR,KLOR-CON) 20 MEQ tablet Take 1 tablet (20 mEq total) by mouth as needed. Take only when you take Furosemide  30 tablet  3  . promethazine (PHENERGAN) 25 MG tablet Take 1 tablet (25 mg total) by mouth every 6 (six) hours as needed for nausea.  30 tablet  0  . rosuvastatin (CRESTOR) 20 MG tablet Take 1 tablet (20 mg total) by mouth daily.  90 tablet  3   No current facility-administered medications for this encounter.     Allergies  Allergen Reactions  . Diovan [Valsartan] Other (See Comments)    Hypotension at low dose, hyperkalemia  . Lipitor [Atorvastatin Calcium] Other (See Comments)    Muscle pain   PHYSICAL EXAM: Filed Vitals:   07/24/13 1111  BP: 110/64  Pulse: 87  Weight: 166 lb (75.297 kg)  SpO2: 97%    General: No respiratory difficulty Wife present  HEENT: normal Neck: supple.  JVD ~7 Carotids 2+ bilat; no bruits. No lymphadenopathy or thryomegaly appreciated. Cor: PMI laterally displaced. Regular rate & rhythm. No rubs, gallop.s 2/6 Johnny murmur Lungs: clear Abdomen: soft, nontender, nondistended. No hepatosplenomegaly. No bruits or masses. Good bowel sounds. Extremities: no cyanosis, clubbing, rash, 1+ edema.  RUE PICC no erythema mor drainage Neuro: alert & oriented x 3, cranial nerves grossly intact. moves all 4 extremities w/o difficulty. Affect pleasant.     ASSESSMENT & PLAN: 1. Chronic Systolic Heart Failure. Followed closely at [facility] for transplant.  NYHA II-IIIB on milrinone 0.25 mcg via PICC.  Volume status mildly elevated.  Continue lasix as needed.  Not on Ace/Arb due to hypotension. Continue hydralazine 25 mg tid. And 15 mg Imdur daily.  Followed by [facility] for home Milrinone and weekly BMET  2. Acute Sinusitis. Start augmentin 875-125 mg twice a day for 7 days.  3. Hypothyroid- Continue synthroid per PCP.   Follow up in 3-4 months   CLEGG,AMYNP-C 11:48 AM  Patient seen and examined with Darrick Grinder, NP. We discussed all aspects of the encounter. I agree with  the assessment and plan as stated above.   Suspect he has acute sinusitis. Will treat with augmentin for 1 week. He also has mild fluid on board and encouraged him to take a dose or two of lasix. Otherwise doing well from an HF perspective. Continue milrinone while awaiting transplant.   Gianlucas Evenson,MD 12:10 PM

## 2013-07-24 NOTE — Telephone Encounter (Signed)
Spoke w/pt's wife she is concerned about pt, appt sch for today at 11am

## 2013-07-24 NOTE — Patient Instructions (Signed)
Take augmentin twice a day for 7 days  Follow up in 3-4 months

## 2013-07-24 NOTE — Telephone Encounter (Signed)
pts wife called as she is concerned about husband. States he if very lethargic, increased in fatigue, dry hacking cough x 3 days, and mild congestion. pts wife states he is just not himself Would like an appt today to have him evaluated

## 2013-07-26 ENCOUNTER — Encounter: Payer: Self-pay | Admitting: Internal Medicine

## 2013-08-07 ENCOUNTER — Encounter (HOSPITAL_COMMUNITY): Payer: Medicare Other

## 2013-08-07 ENCOUNTER — Encounter: Payer: Self-pay | Admitting: Internal Medicine

## 2013-08-07 DIAGNOSIS — I5023 Acute on chronic systolic (congestive) heart failure: Secondary | ICD-10-CM

## 2013-08-07 DIAGNOSIS — I10 Essential (primary) hypertension: Secondary | ICD-10-CM

## 2013-08-07 DIAGNOSIS — I509 Heart failure, unspecified: Secondary | ICD-10-CM

## 2013-08-07 DIAGNOSIS — I2510 Atherosclerotic heart disease of native coronary artery without angina pectoris: Secondary | ICD-10-CM

## 2013-08-15 ENCOUNTER — Telehealth (HOSPITAL_COMMUNITY): Payer: Self-pay | Admitting: *Deleted

## 2013-08-15 NOTE — Telephone Encounter (Signed)
Received labs from [facility], K 5.0 bun 26 cr 1.61 per Junie Bame, NP hold lasix and K x 2 days pt aware he has already taken meds today will hold wed and thur

## 2013-08-22 ENCOUNTER — Other Ambulatory Visit (HOSPITAL_COMMUNITY): Payer: Self-pay | Admitting: Adult Health

## 2013-08-22 ENCOUNTER — Encounter (HOSPITAL_COMMUNITY): Payer: Self-pay | Admitting: Cardiology

## 2013-08-22 ENCOUNTER — Ambulatory Visit (HOSPITAL_COMMUNITY)
Admission: RE | Admit: 2013-08-22 | Discharge: 2013-08-22 | Disposition: A | Discharge status: Home / Self Care | Payer: Medicare Other | Source: Ambulatory Visit | Attending: Internal Medicine | Admitting: Internal Medicine

## 2013-08-22 VITALS — BP 112/54 | HR 88 | Wt 163.5 lb

## 2013-08-22 DIAGNOSIS — I5022 Chronic systolic (congestive) heart failure: Secondary | ICD-10-CM

## 2013-08-22 DIAGNOSIS — D649 Anemia, unspecified: Secondary | ICD-10-CM | POA: Insufficient documentation

## 2013-08-22 MED ORDER — FUROSEMIDE 20 MG PO TABS: 20.0000 mg | ORAL_TABLET | Freq: Every day | ORAL | Status: DC

## 2013-08-22 NOTE — Addendum Note (Signed)
Encounter addended by: Kerry Dory, CMA on: 08/22/2013 11:27 AM<BR>     Documentation filed: Visit Diagnoses, Patient Instructions Section, Orders

## 2013-08-22 NOTE — Progress Notes (Signed)
Patient ID: Johnny Oneill, male   DOB: [birth year], 72 y.o.   MRN: [id number] PCP: Dr Laurann Montana Cardiologist: Dr Martinique  HPI: [name] is a 72 year old male with history of  CAD s/p stent to LCX 2004 with re-occlusion of LCX with lateral MI 2009 and repeat stenting, total occlusion of RCA, chronic systolic heart failure EF 35%, ICD 2009, PVCs, hypothyroidism, hyperlipidemia, and arthritis. S/P L TKR 2013. Last Cath 2009.   10/2012 ECHO EF 25-30%. Mild to mod MR. RV ok.   11/08/2012 Myoview- Large scar present with prior MI    11/14/12 CPX Peak VO2: 16.2 ml/kg/min  predicted peak VO2: 58.4% VE/VCO2 slope: 46. OUES: 1.48 Peak RER: 1.15 Ventilatory Threshold: 10.2 % predicted peak VO2: 36.8%  RHC/LHC 12/01/12  RA 19/15, mean of 13 mm Hg  RV 65/10 mm Hg  PA 59/25, mean of 37 mm Hg  PCWP 26/24, mean of 20 mm Hg  LV 116/26 mm Hg  AO 125/88 mean of 106 mm Hg  Oxygen saturations:  PA 51%  AO 94%  Cardiac Output (Fick) 2.8 L/min , Thermo-3 L/min  Cardiac Index (Fick) 1.5 L/min/m2, Thermo 1.6 L/min/m2  Coronary angiography:  Coronary dominance: right  Left mainstem: less than 10% disease  Left anterior descending (LAD): Heavily calcified with 40% segmental disease in the proximal vessel. Diffuse irregularities.  Left circumflex (LCx): Widely patent stents in the proximal and mid vessel. Less than 20% disease.  Right coronary artery (RCA): Occluded proximally. Left to right collaterals to the RCA   Admitted 12/07/12 with cardiogenic shock requiring milrinone initiation at 0.25 mcg/kg/min.  Had PICC line placed and is followed by [facility].  Discharge weight 157 pounds.    He returns for follow up. Has RHC to 08/01/13 at [facility] with decompensated HF. Diuresed and milrinone was increased to 0.375 mcg. Last week he was instructed to hold lasix and for 2 days and the resume lasix 20 mg as needed. Complains of dry hacking cough and fatigue. Can walk slowly without too much problem but has to go slowly. Can no  longer keep up with his wife.  Denies PND/Orthopnea.Weight at home 162-163 pounds.   [facility] following for home Milrinone and PICC. Compliant with medications. He has follow up at [facility] 09/14/13. RHC planned for March. On lasix 20 daily but held for 2 days as renal function worse. Minimal response to lasix.   03/06/13 Creatinine 1.5 Potassium 4.4 07/21/13 Creatinine 1.3 K 4.4 Magnesium 1.8 Hemoglobin 12.7 08/15/13 Creatinine 1.6 K 5.0 bun 26  08/18/13 Creatinine 1.48 K 4.1 Magnesium 2.0 Hemoglobin 10.1 MCV 86   Unable to tolerate ACE/ARB due to hypotension and severe hyperkalemia.     Review of Systems: All pertinent positives and negatives as in HPI, otherwise negative.     Past Medical History  Diagnosis Date  . Ischemic cardiomyopathy     a. 10/09 Echo: Sev LV Dysfxn, inf/lat AK, Mod MR.  [name];  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 Hypertension   .  PVC (premature ventricular contraction)   . Pacemaker   . CHF (congestive heart failure)   . Shortness of breath     Current Outpatient Prescriptions  Medication Sig Dispense Refill  . amiodarone (PACERONE) 200 MG tablet Take 1 tablet (200 mg total) by mouth 2 (two) times daily.  60 tablet  6  . aspirin 325 MG tablet Take 325 mg by mouth daily.        . carvedilol (COREG) 3.125 MG tablet Take 1 tablet (3.125 mg total) by mouth 2 (two) times daily with a meal.  60 tablet  6  . Coenzyme Q10 (CO Q-10 PO) Take 1 capsule by mouth daily.      [name] ezetimibe (ZETIA) 10 MG tablet Take 1 tablet (10 mg total) by mouth daily.  90 tablet  3  . furosemide (LASIX) 40 MG tablet Take 0.5 tablets (20 mg total) by  mouth as needed. For wt of 165 lb or greater  30 tablet  6  . hydrALAZINE (APRESOLINE) 25 MG tablet Take 1 tablet (25 mg total) by mouth 3 (three) times daily.  90 tablet  3  . isosorbide mononitrate (IMDUR) 30 MG 24 hr tablet Take 0.5 tablets (15 mg total) by mouth daily.  45 tablet  3  . levothyroxine (SYNTHROID, LEVOTHROID) 150 MCG tablet Take 1 tablet daily on an empty stomach and 1/2 tablet on Friday as directed.  85 tablet  2  . milrinone (PRIMACOR) 20 MG/100ML SOLN infusion Inject 0.375 mcg/kg/min into the vein continuous.      . nitroGLYCERIN (NITROSTAT) 0.4 MG SL tablet Place 0.4 mg under the tongue every 5 (five) minutes as needed. For chest pain      . potassium chloride SA (K-DUR,KLOR-CON) 20 MEQ tablet Take 1 tablet (20 mEq total) by mouth as needed. Take only when you take Furosemide  30 tablet  3  . promethazine (PHENERGAN) 25 MG tablet Take 1 tablet (25 mg total) by mouth every 6 (six) hours as needed for nausea.  30 tablet  0  . rosuvastatin (CRESTOR) 20 MG tablet Take 1 tablet (20 mg total) by mouth daily.  90 tablet  3   No current facility-administered medications for this encounter.     Allergies  Allergen Reactions  . Diovan [Valsartan] Other (See Comments)    Hypotension at low dose, hyperkalemia  . Lipitor [Atorvastatin Calcium] Other (See Comments)    Muscle pain   PHYSICAL EXAM: Filed Vitals:   08/22/13 1000  BP: 112/54  Pulse: 88  Weight: 163 lb 8 oz (74.163 kg)  SpO2: 98%    General: No respiratory difficulty Wife present  HEENT: normal Neck: supple.  JVD ~7-8 Carotids 2+ bilat; no bruits. No lymphadenopathy or thryomegaly appreciated. Cor: PMI laterally displaced. Regular rate & rhythm. No rubs, gallops 3/6 MR murmur Lungs: clear Abdomen: soft, nontender, nondistended. No hepatosplenomegaly. No bruits or masses. Good bowel sounds. Extremities: no cyanosis, clubbing, rash, 1+ edema.  RUE PICC no erythema mor drainage Neuro: alert & oriented x 3,  cranial nerves grossly intact. moves all 4 extremities w/o difficulty. Affect pleasant.     ASSESSMENT & PLAN: 1. Chronic Systolic Heart Failure. Followed closely at [facility] for transplant. He has been on the transplant list for 6 months.  NYHAIIIB on milrinone 0.375 mcg (increased 08/01/13) via PICC. He has had functional decline over the last month. Decreased energy and increased dyspnea on exertion. Walking slower. Volume status stable. Continue lasix as needed.   Continue carvedilol 3.125  mg twice a day Not on Ace/Arb due to hypotension. Continue hydralazine 25 mg tid. And 15 mg Imdur daily.  Followed by [facility] for home Milrinone and weekly BMET  2. Anemia- Hemoglobin 12.7 in November now 10.1 without BRBPR. Add Iron.   3. Hypothyroid- Continue synthroid per PCP.   Follow up in 3-4 months   CLEGG,AMYNP-C 10:15 AM  Patient seen and examined with Darrick Grinder, NP. We discussed all aspects of the encounter. I agree with the assessment and plan as stated above.  He remains quite tenuous. Has mild volume overload. It is difficult to get an exact handle on his symptoms but both I and his wife feel he is getting worse. Likely combination of HF and anemia. We discussed situation at the length and I think it may be time to consider using his Ia time on the transplant list. I will discuss with [facility]. May be best to proceed with RHC here to see where he stands and if decompensated send to [facility] for further care. Take lasix 40 mg today and tomorrow and then back to 20 daily. Follow labs closely.   Shriley Joffe,MD 11:09 AM

## 2013-08-22 NOTE — Patient Instructions (Signed)
TAKE Furosemide 20mg  two pills (40 mg total) x  2 days, then continue Furosemide 20 mg daily  Labs needed Friday 08/25/13 (CbC and BMET)`  Your physician has requested that you have a cardiac catheterization. Cardiac catheterization is used to diagnose and/or treat various heart conditions. Doctors may recommend this procedure for a number of different reasons. The most common reason is to evaluate chest pain. Chest pain can be a symptom of coronary artery disease (CAD), and cardiac catheterization can show whether plaque is narrowing or blocking your heart's arteries. This procedure is also used to evaluate the valves, as well as measure the blood flow and oxygen levels in different parts of your heart. For further information please visit [email or web address]. Please follow instruction sheet, as given.    Your physician recommends that you schedule a follow-up appointment in: 4 weeks

## 2013-08-23 ENCOUNTER — Encounter (HOSPITAL_COMMUNITY): Payer: Self-pay | Admitting: Pharmacist

## 2013-08-25 ENCOUNTER — Ambulatory Visit (HOSPITAL_COMMUNITY)
Admission: RE | Admit: 2013-08-25 | Discharge: 2013-08-25 | Disposition: A | Discharge status: Home / Self Care | Payer: Medicare Other | Source: Ambulatory Visit | Attending: Internal Medicine | Admitting: Internal Medicine

## 2013-08-25 ENCOUNTER — Encounter (HOSPITAL_COMMUNITY)
Admission: RE | Disposition: A | Discharge status: Home / Self Care | Payer: Self-pay | Source: Ambulatory Visit | Attending: Internal Medicine

## 2013-08-25 DIAGNOSIS — Z95810 Presence of automatic (implantable) cardiac defibrillator: Secondary | ICD-10-CM | POA: Insufficient documentation

## 2013-08-25 DIAGNOSIS — I10 Essential (primary) hypertension: Secondary | ICD-10-CM | POA: Insufficient documentation

## 2013-08-25 DIAGNOSIS — E039 Hypothyroidism, unspecified: Secondary | ICD-10-CM | POA: Insufficient documentation

## 2013-08-25 DIAGNOSIS — I4510 Unspecified right bundle-branch block: Secondary | ICD-10-CM | POA: Insufficient documentation

## 2013-08-25 DIAGNOSIS — Z9861 Coronary angioplasty status: Secondary | ICD-10-CM | POA: Insufficient documentation

## 2013-08-25 DIAGNOSIS — I5022 Chronic systolic (congestive) heart failure: Secondary | ICD-10-CM | POA: Insufficient documentation

## 2013-08-25 DIAGNOSIS — I509 Heart failure, unspecified: Secondary | ICD-10-CM | POA: Insufficient documentation

## 2013-08-25 DIAGNOSIS — E785 Hyperlipidemia, unspecified: Secondary | ICD-10-CM | POA: Insufficient documentation

## 2013-08-25 DIAGNOSIS — I2589 Other forms of chronic ischemic heart disease: Secondary | ICD-10-CM | POA: Insufficient documentation

## 2013-08-25 DIAGNOSIS — Z7982 Long term (current) use of aspirin: Secondary | ICD-10-CM | POA: Insufficient documentation

## 2013-08-25 DIAGNOSIS — I2510 Atherosclerotic heart disease of native coronary artery without angina pectoris: Secondary | ICD-10-CM | POA: Insufficient documentation

## 2013-08-25 DIAGNOSIS — I2789 Other specified pulmonary heart diseases: Secondary | ICD-10-CM | POA: Insufficient documentation

## 2013-08-25 DIAGNOSIS — D649 Anemia, unspecified: Secondary | ICD-10-CM | POA: Insufficient documentation

## 2013-08-25 DIAGNOSIS — I252 Old myocardial infarction: Secondary | ICD-10-CM | POA: Insufficient documentation

## 2013-08-25 HISTORY — PX: RIGHT HEART CATHETERIZATION: SHX5447

## 2013-08-25 LAB — BASIC METABOLIC PANEL
BUN: 28 mg/dL — ABNORMAL HIGH (ref 6–23)
CO2: 25 mEq/L (ref 19–32)
Calcium: 8.5 mg/dL (ref 8.4–10.5)
Chloride: 103 mEq/L (ref 96–112)
Creatinine, Ser: 1.38 mg/dL — ABNORMAL HIGH (ref 0.50–1.35)
GFR calc Af Amer: 58 mL/min — ABNORMAL LOW (ref >90)
GFR calc non Af Amer: 50 mL/min — ABNORMAL LOW (ref >90)
Glucose, Bld: 97 mg/dL (ref 70–99)
Potassium: 3 mEq/L — ABNORMAL LOW (ref 3.5–5.1)
Sodium: 141 mEq/L (ref 135–145)

## 2013-08-25 LAB — CBC
HCT: 31.2 % — ABNORMAL LOW (ref 39.0–52.0)
Hemoglobin: 10 g/dL — ABNORMAL LOW (ref 13.0–17.0)
MCH: 27.4 pg (ref 26.0–34.0)
MCHC: 32.1 g/dL (ref 30.0–36.0)
MCV: 85.5 fL (ref 78.0–100.0)
Platelets: 250 10*3/uL (ref 150–400)
RBC: 3.65 MIL/uL — ABNORMAL LOW (ref 4.22–5.81)
RDW: 17.5 % — ABNORMAL HIGH (ref 11.5–15.5)
WBC: 9 10*3/uL (ref 4.0–10.5)

## 2013-08-25 LAB — POCT I-STAT 3, VENOUS BLOOD GAS (G3P V)
Acid-Base Excess: 1 mmol/L (ref 0.0–2.0)
Bicarbonate: 24.7 mEq/L — ABNORMAL HIGH (ref 20.0–24.0)
Bicarbonate: 24.9 mEq/L — ABNORMAL HIGH (ref 20.0–24.0)
O2 Saturation: 50 %
O2 Saturation: 56 %
TCO2: 26 mmol/L (ref 0–100)
TCO2: 26 mmol/L (ref 0–100)
pCO2, Ven: 37 mmHg — ABNORMAL LOW (ref 45.0–50.0)
pCO2, Ven: 39.2 mmHg — ABNORMAL LOW (ref 45.0–50.0)
pH, Ven: 7.41 — ABNORMAL HIGH (ref 7.250–7.300)
pH, Ven: 7.432 — ABNORMAL HIGH (ref 7.250–7.300)
pO2, Ven: 27 mmHg — CL (ref 30.0–45.0)
pO2, Ven: 28 mmHg — CL (ref 30.0–45.0)

## 2013-08-25 LAB — PROTIME-INR
INR: 1.13 (ref 0.00–1.49)
Prothrombin Time: 14.3 seconds (ref 11.6–15.2)

## 2013-08-25 SURGERY — RIGHT HEART CATH
Anesthesia: LOCAL

## 2013-08-25 MED ORDER — MIDAZOLAM HCL 2 MG/2ML IJ SOLN
INTRAMUSCULAR | Status: AC
  Filled 2013-08-25: qty 2

## 2013-08-25 MED ORDER — ONDANSETRON HCL 4 MG/2ML IJ SOLN: 4.0000 mg | Freq: Four times a day (QID) | INTRAMUSCULAR | Status: DC | PRN

## 2013-08-25 MED ORDER — LIDOCAINE HCL (PF) 1 % IJ SOLN
INTRAMUSCULAR | Status: AC
  Filled 2013-08-25: qty 30

## 2013-08-25 MED ORDER — ASPIRIN 81 MG PO CHEW: 81.0000 mg | CHEWABLE_TABLET | ORAL | Status: DC

## 2013-08-25 MED ORDER — HEPARIN SOD (PORK) LOCK FLUSH 100 UNIT/ML IV SOLN
500.0000 [IU] | Freq: Once | INTRAVENOUS | Status: AC
  Administered 2013-08-25: 250 [IU] via INTRAVENOUS

## 2013-08-25 MED ORDER — SODIUM CHLORIDE 0.9 % IJ SOLN: 3.0000 mL | Freq: Two times a day (BID) | INTRAMUSCULAR | Status: DC

## 2013-08-25 MED ORDER — SODIUM CHLORIDE 0.9 % IV SOLN: 250.0000 mL | INTRAVENOUS | Status: DC | PRN

## 2013-08-25 MED ORDER — SODIUM CHLORIDE 0.9 % IJ SOLN: 3.0000 mL | INTRAMUSCULAR | Status: DC | PRN

## 2013-08-25 MED ORDER — ACETAMINOPHEN 325 MG PO TABS: 650.0000 mg | ORAL_TABLET | ORAL | Status: DC | PRN

## 2013-08-25 MED ORDER — SODIUM CHLORIDE 0.9 % IV SOLN: INTRAVENOUS | Status: DC

## 2013-08-25 MED ORDER — HEPARIN SOD (PORK) LOCK FLUSH 100 UNIT/ML IV SOLN
INTRAVENOUS | Status: AC
  Filled 2013-08-25: qty 5

## 2013-08-25 MED ORDER — FENTANYL CITRATE 0.05 MG/ML IJ SOLN
INTRAMUSCULAR | Status: AC
  Filled 2013-08-25: qty 2

## 2013-08-25 MED ORDER — POTASSIUM CHLORIDE CRYS ER 20 MEQ PO TBCR
40.0000 meq | EXTENDED_RELEASE_TABLET | Freq: Once | ORAL | Status: AC
  Administered 2013-08-25: 40 meq via ORAL
  Filled 2013-08-25: qty 2

## 2013-08-25 MED ORDER — HEPARIN (PORCINE) IN NACL 2-0.9 UNIT/ML-% IJ SOLN
INTRAMUSCULAR | Status: AC
  Filled 2013-08-25: qty 500

## 2013-08-25 NOTE — H&P (View-Only) (Signed)
Patient ID: BRADDEN HARDWELL, male   DOB: 04-Jun-1941, 72 y.o.   MRN: [id number] PCP: Dr Laurann Montana Cardiologist: Dr Martinique  HPI: Mr Minten is a 72 year old male with history of  CAD s/p stent to LCX 2004 with re-occlusion of LCX with lateral MI 2009 and repeat stenting, total occlusion of RCA, chronic systolic heart failure EF 35%, ICD 2009, PVCs, hypothyroidism, hyperlipidemia, and arthritis. S/P L TKR 2013. Last Cath 2009.   10/2012 ECHO EF 25-30%. Mild to mod MR. RV ok.   11/08/2012 Myoview- Large scar present with prior MI    11/14/12 CPX Peak VO2: 16.2 ml/kg/min  predicted peak VO2: 58.4% VE/VCO2 slope: 46. OUES: 1.48 Peak RER: 1.15 Ventilatory Threshold: 10.2 % predicted peak VO2: 36.8%  RHC/LHC 12/01/12  RA 19/15, mean of 13 mm Hg  RV 65/10 mm Hg  PA 59/25, mean of 37 mm Hg  PCWP 26/24, mean of 20 mm Hg  LV 116/26 mm Hg  AO 125/88 mean of 106 mm Hg  Oxygen saturations:  PA 51%  AO 94%  Cardiac Output (Fick) 2.8 L/min , Thermo-3 L/min  Cardiac Index (Fick) 1.5 L/min/m2, Thermo 1.6 L/min/m2  Coronary angiography:  Coronary dominance: right  Left mainstem: less than 10% disease  Left anterior descending (LAD): Heavily calcified with 40% segmental disease in the proximal vessel. Diffuse irregularities.  Left circumflex (LCx): Widely patent stents in the proximal and mid vessel. Less than 20% disease.  Right coronary artery (RCA): Occluded proximally. Left to right collaterals to the RCA   Admitted 12/07/12 with cardiogenic shock requiring milrinone initiation at 0.25 mcg/kg/min.  Had PICC line placed and is followed by [facility].  Discharge weight 157 pounds.    He returns for follow up. Has RHC to 08/01/13 at [facility] with decompensated HF. Diuresed and milrinone was increased to 0.375 mcg. Last week he was instructed to hold lasix and for 2 days and the resume lasix 20 mg as needed. Complains of dry hacking cough and fatigue. Can walk slowly without too much problem but has to go slowly. Can no  longer keep up with his wife.  Denies PND/Orthopnea.Weight at home 162-163 pounds.   [facility] following for home Milrinone and PICC. Compliant with medications. He has follow up at [facility] 09/14/13. RHC planned for March. On lasix 20 daily but held for 2 days as renal function worse. Minimal response to lasix.   03/06/13 Creatinine 1.5 Potassium 4.4 07/21/13 Creatinine 1.3 K 4.4 Magnesium 1.8 Hemoglobin 12.7 08/15/13 Creatinine 1.6 K 5.0 bun 26  08/18/13 Creatinine 1.48 K 4.1 Magnesium 2.0 Hemoglobin 10.1 MCV 86   Unable to tolerate ACE/ARB due to hypotension and severe hyperkalemia.     Review of Systems: All pertinent positives and negatives as in HPI, otherwise negative.     Past Medical History  Diagnosis Date  . Ischemic cardiomyopathy     a. 10/09 Echo: Sev LV Dysfxn, inf/lat AK, Mod MR.  [name];  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 Hypertension   .  PVC (premature ventricular contraction)   . Pacemaker   . CHF (congestive heart failure)   . Shortness of breath     Current Outpatient Prescriptions  Medication Sig Dispense Refill  . amiodarone (PACERONE) 200 MG tablet Take 1 tablet (200 mg total) by mouth 2 (two) times daily.  60 tablet  6  . aspirin 325 MG tablet Take 325 mg by mouth daily.        . carvedilol (COREG) 3.125 MG tablet Take 1 tablet (3.125 mg total) by mouth 2 (two) times daily with a meal.  60 tablet  6  . Coenzyme Q10 (CO Q-10 PO) Take 1 capsule by mouth daily.      [name] ezetimibe (ZETIA) 10 MG tablet Take 1 tablet (10 mg total) by mouth daily.  90 tablet  3  . furosemide (LASIX) 40 MG tablet Take 0.5 tablets (20 mg total) by  mouth as needed. For wt of 165 lb or greater  30 tablet  6  . hydrALAZINE (APRESOLINE) 25 MG tablet Take 1 tablet (25 mg total) by mouth 3 (three) times daily.  90 tablet  3  . isosorbide mononitrate (IMDUR) 30 MG 24 hr tablet Take 0.5 tablets (15 mg total) by mouth daily.  45 tablet  3  . levothyroxine (SYNTHROID, LEVOTHROID) 150 MCG tablet Take 1 tablet daily on an empty stomach and 1/2 tablet on Friday as directed.  85 tablet  2  . milrinone (PRIMACOR) 20 MG/100ML SOLN infusion Inject 0.375 mcg/kg/min into the vein continuous.      . nitroGLYCERIN (NITROSTAT) 0.4 MG SL tablet Place 0.4 mg under the tongue every 5 (five) minutes as needed. For chest pain      . potassium chloride SA (K-DUR,KLOR-CON) 20 MEQ tablet Take 1 tablet (20 mEq total) by mouth as needed. Take only when you take Furosemide  30 tablet  3  . promethazine (PHENERGAN) 25 MG tablet Take 1 tablet (25 mg total) by mouth every 6 (six) hours as needed for nausea.  30 tablet  0  . rosuvastatin (CRESTOR) 20 MG tablet Take 1 tablet (20 mg total) by mouth daily.  90 tablet  3   No current facility-administered medications for this encounter.     Allergies  Allergen Reactions  . Diovan [Valsartan] Other (See Comments)    Hypotension at low dose, hyperkalemia  . Lipitor [Atorvastatin Calcium] Other (See Comments)    Muscle pain   PHYSICAL EXAM: Filed Vitals:   08/22/13 1000  BP: 112/54  Pulse: 88  Weight: 163 lb 8 oz (74.163 kg)  SpO2: 98%    General: No respiratory difficulty Wife present  HEENT: normal Neck: supple.  JVD ~7-8 Carotids 2+ bilat; no bruits. No lymphadenopathy or thryomegaly appreciated. Cor: PMI laterally displaced. Regular rate & rhythm. No rubs, gallops 3/6 MR murmur Lungs: clear Abdomen: soft, nontender, nondistended. No hepatosplenomegaly. No bruits or masses. Good bowel sounds. Extremities: no cyanosis, clubbing, rash, 1+ edema.  RUE PICC no erythema mor drainage Neuro: alert & oriented x 3,  cranial nerves grossly intact. moves all 4 extremities w/o difficulty. Affect pleasant.     ASSESSMENT & PLAN: 1. Chronic Systolic Heart Failure. Followed closely at [facility] for transplant. He has been on the transplant list for 6 months.  NYHAIIIB on milrinone 0.375 mcg (increased 08/01/13) via PICC. He has had functional decline over the last month. Decreased energy and increased dyspnea on exertion. Walking slower. Volume status stable. Continue lasix as needed.   Continue carvedilol 3.125  mg twice a day Not on Ace/Arb due to hypotension. Continue hydralazine 25 mg tid. And 15 mg Imdur daily.  Followed by [facility] for home Milrinone and weekly BMET  2. Anemia- Hemoglobin 12.7 in November now 10.1 without BRBPR. Add Iron.   3. Hypothyroid- Continue synthroid per PCP.   Follow up in 3-4 months   CLEGG,AMYNP-C 10:15 AM  Patient seen and examined with Darrick Grinder, NP. We discussed all aspects of the encounter. I agree with the assessment and plan as stated above.  He remains quite tenuous. Has mild volume overload. It is difficult to get an exact handle on his symptoms but both I and his wife feel he is getting worse. Likely combination of HF and anemia. We discussed situation at the length and I think it may be time to consider using his Ia time on the transplant list. I will discuss with [facility]. May be best to proceed with RHC here to see where he stands and if decompensated send to [facility] for further care. Take lasix 40 mg today and tomorrow and then back to 20 daily. Follow labs closely.   Dagan Heinz,MD 11:09 AM

## 2013-08-25 NOTE — CV Procedure (Signed)
Cardiac Cath Procedure Note:  Indication:  HF  Procedures performed:  1) Right heart catheterization  Description of procedure:   The risks and indication of the procedure were explained. Consent was signed and placed on the chart. An appropriate timeout was taken prior to the procedure. The right neck was prepped and draped in the routine sterile fashion and anesthetized with 1% local lidocaine.   A 7 FR venous sheath was placed in the right internal jugular vein using a modified Seldinger technique. A standard Swan-Ganz catheter was used for the procedure.   Complications: None apparent.  Findings:  Done on milrinone milrinone 0.353mcg/kg/min  RA = 7 RV = 67/5/8 PA = 66/26 (42) PCW = 21 Fick cardiac output/index = 5.6/3.1 Thermo Co/CI = 4.4/2.4 PVR = 3.75 (Fick) 4.8 (thermo) FA sat = 85% (after sedation) PA sat = 50%, 56%  Assessment:  1) Mild to moderately elevated filling pressures 2) Moderate mixed Pulmonary HTN 3) Normal cardiac output on milrinone  Plan/Discussion:  Will continue medical therapy for now. Add Revatio 20 tid. Given worsening functional capacity and PH despite milrinone, will likely need VAD as bridge. Will d/w transplant team at [facility].  Daniel Bensimhon,MD 11:59 AM    Quillian Quince Bensimhon 11:54 AM

## 2013-08-25 NOTE — Interval H&P Note (Signed)
History and Physical Interval Note:  08/25/2013 11:53 AM  Johnny Oneill  has presented today for surgery, with the diagnosis of chf  The various methods of treatment have been discussed with the patient and family. After consideration of risks, benefits and other options for treatment, the patient has consented to  Procedure(s): RIGHT HEART CATH (N/A) as a surgical intervention .  The patient's history has been reviewed, patient examined, no change in status, stable for surgery.  I have reviewed the patient's chart and labs.  Questions were answered to the patient's satisfaction.     Daniel Bensimhon

## 2013-08-29 ENCOUNTER — Other Ambulatory Visit: Payer: Self-pay | Admitting: Cardiology

## 2013-09-01 ENCOUNTER — Telehealth (HOSPITAL_COMMUNITY): Payer: Self-pay | Admitting: Anesthesiology

## 2013-09-01 NOTE — Telephone Encounter (Signed)
Cr elevated from 1.38 to 1.6. Will hold lasix for two days. Patient aware and will recheck next week.    Junie Bame B NP-C 3:17 PM

## 2013-09-04 ENCOUNTER — Encounter: Payer: Self-pay | Admitting: Internal Medicine

## 2013-09-08 ENCOUNTER — Telehealth (HOSPITAL_COMMUNITY): Payer: Self-pay | Admitting: *Deleted

## 2013-09-08 ENCOUNTER — Other Ambulatory Visit (HOSPITAL_COMMUNITY): Payer: Self-pay | Admitting: Surgery

## 2013-09-08 DIAGNOSIS — I499 Cardiac arrhythmia, unspecified: Secondary | ICD-10-CM

## 2013-09-08 DIAGNOSIS — I509 Heart failure, unspecified: Secondary | ICD-10-CM

## 2013-09-08 MED ORDER — AMIODARONE HCL 200 MG PO TABS: 200.0000 mg | ORAL_TABLET | Freq: Two times a day (BID) | ORAL | Status: DC

## 2013-09-08 NOTE — Telephone Encounter (Signed)
Received labs from today, bun 30 cr 1.7 hgb 9.1 reviewed with Dr Haroldine Laws he wants pt to continue current regimen and f/u early next week, spoke w/pt's wife she states pt does have edema in feet appt sch for Mon 1/12 with [place], pt was sch to see [place] but she will cx that appt to bring him here

## 2013-09-11 ENCOUNTER — Encounter (HOSPITAL_COMMUNITY): Payer: Self-pay

## 2013-09-11 ENCOUNTER — Encounter (HOSPITAL_COMMUNITY): Payer: Self-pay | Admitting: Cardiology

## 2013-09-11 ENCOUNTER — Ambulatory Visit (HOSPITAL_BASED_OUTPATIENT_CLINIC_OR_DEPARTMENT_OTHER)
Admission: RE | Admit: 2013-09-11 | Discharge: 2013-09-11 | Disposition: A | Discharge status: Home / Self Care | Payer: Medicare Other | Source: Ambulatory Visit | Attending: Internal Medicine | Admitting: Internal Medicine

## 2013-09-11 VITALS — BP 106/64 | HR 86 | Wt 163.8 lb

## 2013-09-11 DIAGNOSIS — I252 Old myocardial infarction: Secondary | ICD-10-CM | POA: Insufficient documentation

## 2013-09-11 DIAGNOSIS — I959 Hypotension, unspecified: Secondary | ICD-10-CM

## 2013-09-11 DIAGNOSIS — D649 Anemia, unspecified: Secondary | ICD-10-CM

## 2013-09-11 DIAGNOSIS — I5022 Chronic systolic (congestive) heart failure: Secondary | ICD-10-CM | POA: Insufficient documentation

## 2013-09-11 DIAGNOSIS — E7800 Pure hypercholesterolemia, unspecified: Secondary | ICD-10-CM | POA: Insufficient documentation

## 2013-09-11 DIAGNOSIS — E039 Hypothyroidism, unspecified: Secondary | ICD-10-CM

## 2013-09-11 DIAGNOSIS — I2510 Atherosclerotic heart disease of native coronary artery without angina pectoris: Secondary | ICD-10-CM | POA: Insufficient documentation

## 2013-09-11 DIAGNOSIS — I2589 Other forms of chronic ischemic heart disease: Secondary | ICD-10-CM

## 2013-09-11 DIAGNOSIS — I509 Heart failure, unspecified: Secondary | ICD-10-CM

## 2013-09-11 DIAGNOSIS — Z79899 Other long term (current) drug therapy: Secondary | ICD-10-CM | POA: Insufficient documentation

## 2013-09-11 DIAGNOSIS — I10 Essential (primary) hypertension: Secondary | ICD-10-CM | POA: Insufficient documentation

## 2013-09-11 DIAGNOSIS — I5023 Acute on chronic systolic (congestive) heart failure: Secondary | ICD-10-CM

## 2013-09-11 DIAGNOSIS — E785 Hyperlipidemia, unspecified: Secondary | ICD-10-CM | POA: Insufficient documentation

## 2013-09-11 DIAGNOSIS — Z95810 Presence of automatic (implantable) cardiac defibrillator: Secondary | ICD-10-CM

## 2013-09-11 LAB — CBC
HCT: 31.3 % — ABNORMAL LOW (ref 39.0–52.0)
Hemoglobin: 9.9 g/dL — ABNORMAL LOW (ref 13.0–17.0)
MCH: 25.6 pg — ABNORMAL LOW (ref 26.0–34.0)
MCHC: 31.6 g/dL (ref 30.0–36.0)
MCV: 80.9 fL (ref 78.0–100.0)
Platelets: 262 10*3/uL (ref 150–400)
RBC: 3.87 MIL/uL — ABNORMAL LOW (ref 4.22–5.81)
RDW: 18.2 % — ABNORMAL HIGH (ref 11.5–15.5)
WBC: 6.5 10*3/uL (ref 4.0–10.5)

## 2013-09-11 LAB — BASIC METABOLIC PANEL
BUN: 34 mg/dL — ABNORMAL HIGH (ref 6–23)
CO2: 25 mEq/L (ref 19–32)
Calcium: 8.6 mg/dL (ref 8.4–10.5)
Chloride: 103 mEq/L (ref 96–112)
Creatinine, Ser: 1.63 mg/dL — ABNORMAL HIGH (ref 0.50–1.35)
GFR calc Af Amer: 48 mL/min — ABNORMAL LOW (ref >90)
GFR calc non Af Amer: 41 mL/min — ABNORMAL LOW (ref >90)
Glucose, Bld: 109 mg/dL — ABNORMAL HIGH (ref 70–99)
Potassium: 3.2 mEq/L — ABNORMAL LOW (ref 3.7–5.3)
Sodium: 142 mEq/L (ref 137–147)

## 2013-09-11 NOTE — Patient Instructions (Signed)
Pt is scheduled for hospital admission on 09/13/13 DX: 428.22 CPT: 99223 Primary insurance Medicare A and B- no pre cert req'd Secondary insurance BC[employer]- supp- no pre cert req'd

## 2013-09-11 NOTE — Addendum Note (Signed)
Encounter addended by: Scarlette Calico, RN on: 09/11/2013 11:41 AM<BR>     Documentation filed: Patient Instructions Section, Orders

## 2013-09-11 NOTE — Patient Instructions (Signed)
Hospital will call you on Wed 1/14 when your room is ready

## 2013-09-11 NOTE — Addendum Note (Signed)
Encounter addended by: Rande Brunt, NP on: 09/11/2013  5:36 PM<BR>     Documentation filed: Notes Section

## 2013-09-11 NOTE — Progress Notes (Addendum)
Patient ID: Johnny Oneill, male   DOB: Oct 26, 1940, 73 y.o.   MRN: [id number]  PCP: Dr Laurann Montana Cardiologist: Dr Martinique  HPI: Johnny Oneill is a 73 year old male with history of  CAD s/p stent to LCX 2004 with re-occlusion of LCX with lateral MI 2009 and repeat stenting, total occlusion of RCA, chronic systolic heart failure EF 35%, ICD 2009, PVCs, hypothyroidism, hyperlipidemia, and arthritis. S/P L TKR 2013. Last Cath 2009.   10/2012 ECHO EF 25-30%. Mild to mod Johnny. RV ok.   11/08/2012 Myoview- Large scar present with prior MI    11/14/12 CPX Peak VO2: 16.2 ml/kg/min  predicted peak VO2: 58.4% VE/VCO2 slope: 46. OUES: 1.48 Peak RER: 1.15 Ventilatory Threshold: 10.2 % predicted peak VO2: 36.8%  Admitted 4/14 with cardiogenic shock requiring milrinone initiation at 0.25 mcg/kg/min.  Had PICC line placed and is followed by [facility].  Discharge weight 157 pounds.    RHC/LHC 12/01/12  Coronary angiography:  Coronary dominance: right  Left mainstem: less than 10% disease  Left anterior descending (LAD): Heavily calcified with 40% segmental disease in the proximal vessel. Diffuse irregularities.  Left circumflex (LCx): Widely patent stents in the proximal and mid vessel. Less than 20% disease.  Right coronary artery (RCA): Occluded proximally. Left to right collaterals to the RCA   Had Johnny Oneill 08/01/13 at [facility] with decompensated HF. Diuresed and milrinone was increased to 0.375 mcg. In December was feeling worth and RHC was undertaken as below.   Johnny Oneill 08/25/13 RA = 7  RV = 67/5/8  PA = 66/26 (42)  PCW = 21  Fick cardiac output/index = 5.6/3.1  Thermo Co/CI = 4.4/2.4  PVR = 3.75 (Fick) 4.8 (thermo)  FA sat = 85% (after sedation)  PA sat = 50%, 56%  Acute work in visit: Post cath started on sildenafil but hasn't received it yet. Has been feeling slightly worse over the last week.  Cr was elevated to 1.4->1.6 last week and told to hold lasix x 2 days, repeat Cr 1.7. Wanted to start on sildenafil, however he  has not been on and insurance approved today. Over the weekend ankle edema and started lasix back. Denies CP, orthopnea, or palpitations. SOB one day last week. + dry hacking cough. Weight at home 160-162 lbs. Following a low salt diet and drinking less than 2L a day. Very limited. Can only walk about 100 yards before he has to stop.   03/06/13 Creatinine 1.5 Potassium 4.4 07/21/13 Creatinine 1.3 K 4.4 Magnesium 1.8 Hemoglobin 12.7 08/15/13 Creatinine 1.6 K 5.0 bun 26  08/18/13 Creatinine 1.48 K 4.1 Magnesium 2.0 Hemoglobin 10.1 MCV 86   Unable to tolerate ACE/ARB due to hypotension and severe hyperkalemia.     Review of Systems: All pertinent positives and negatives as in HPI, otherwise negative.     Past Medical History  Diagnosis Date  . Ischemic cardiomyopathy     a. 10/09 Echo: Sev LV Dysfxn, inf/lat AK, Mod Johnny.  [name];  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  Hypertension   . PVC (premature ventricular contraction)   . Pacemaker   . CHF (congestive heart failure)   . Shortness of breath     Current Outpatient Prescriptions  Medication Sig Dispense Refill  . amiodarone (PACERONE) 200 MG tablet Take 1 tablet (200 mg total) by mouth 2 (two) times daily.  60 tablet  6  . aspirin 325 MG tablet Take 325 mg by mouth daily.        . carvedilol (COREG) 3.125 MG tablet Take 1 tablet (3.125 mg total) by mouth 2 (two) times daily with a meal.  60 tablet  6  . Coenzyme Q10 (CO Q-10 PO) Take 1 capsule by mouth daily.      . CRESTOR 20 MG tablet TAKE 1 TABLET EVERY DAY  90 tablet  0  . furosemide (LASIX) 20 MG tablet Take 20 mg by mouth daily as  needed (for 3 lbs or more weight gain per day or 5 lbs or more weight gain per week or as instructed by Dr. Tempie Hoist.).      [name] hydrALAZINE (APRESOLINE) 25 MG tablet Take 1 tablet (25 mg total) by mouth 3 (three) times daily.  90 tablet  3  . isosorbide mononitrate (IMDUR) 30 MG 24 hr tablet Take 0.5 tablets (15 mg total) by mouth daily.  45 tablet  3  . levothyroxine (SYNTHROID, LEVOTHROID) 150 MCG tablet 75-150 mcg daily before breakfast. Take 1 tablet (150 mcg) daily on an empty stomach except take 1/2 tablet (75 mcg) every Friday as directed.      . milrinone (PRIMACOR) 20 MG/100ML SOLN infusion Inject 0.375 mcg/kg/min into the vein continuous.      . nitroGLYCERIN (NITROSTAT) 0.4 MG SL tablet Place 0.4 mg under the tongue every 5 (five) minutes as needed. For chest pain      . Omega-3 300 MG CAPS Take 1 capsule by mouth daily.      . potassium chloride SA (K-DUR,KLOR-CON) 20 MEQ tablet Take 1 tablet (20 mEq total) by mouth as needed. Take only when you take Furosemide  30 tablet  3  . promethazine (PHENERGAN) 25 MG tablet Take 1 tablet (25 mg total) by mouth every 6 (six) hours as needed for nausea.  30 tablet  0  . ZETIA 10 MG tablet TAKE 1 TABLET EVERY DAY  90 tablet  0   No current facility-administered medications for this encounter.     Allergies  Allergen Reactions  . Diovan [Valsartan] Other (See Comments)    Hypotension at low dose, hyperkalemia  . Lipitor [Atorvastatin Calcium] Other (See Comments)    Muscle pain   PHYSICAL EXAM: Filed Vitals:   09/11/13 1036  BP: 106/64  Pulse: 86  Weight: 163 lb 12.8 oz (74.299 kg)  SpO2: 98%   General: No respiratory difficulty Wife present  HEENT: normal Neck: supple.  JVD ~9-10 +prominent CV-waves Carotids 2+ bilat; no bruits. No lymphadenopathy or thryomegaly appreciated. Cor: PMI laterally displaced. Regular rate & rhythm. No rubs, 3/6 Johnny murmur +s3 Lungs: clear Abdomen: soft, nontender, nondistended. No hepatosplenomegaly. No  bruits or masses. Good bowel sounds. Extremities: no cyanosis, clubbing, rash, 2-3+ edema. R>L  RUE PICC no erythema mor drainage Neuro: alert & oriented x 3, cranial nerves grossly intact. moves all 4 extremities w/o difficulty. Affect pleasant.   ASSESSMENT & PLAN: 1. Chronic Systolic Heart Failure: ICM, EF 30% (11/2012) - Followed closely at [facility] and is currently on the transplant list. He is O+. Over the past few  weeks has noticed a decline in how he feels and noticed increased SOB. His Cr has been trending up and we have held lasix, however it still has trended up. He remains on milrinone 0.375 mcg at home and was approved for sildenafil 20 mg TID today. - Will admit Wednesday for possible balloon pump and LVAD. -Continue carvedilol 3.125 mg twice a day -Not on Ace/Arb due to hypotension. Continue hydralazine 25 mg tid. And 15 mg Imdur daily.  - Reinforced the need and importance of daily weights, a low sodium diet, and fluid restriction (less than 2 L a day). Instructed to call the HF clinic if weight increases more than 3 lbs overnight or 5 lbs in a week.  2. Anemia- Hemoglobin 12.7 in November, however has been trending down. Not currently on iron, consider starting when admitted. Denies any bleeding. .  3. Hypothyroid- Continue synthroid per PCP.   Junie Bame BNP-C 10:37 AM  Patient seen and examined with Junie Bame, NP. We discussed all aspects of the encounter. I agree with the assessment and plan as stated above.   He is volume overloaded with worsening functional capacity, pulmonary pressures and renal function. Now NYHA IIIB-IV despite milrinone therapy (INTERMACS 2). I spoke with Dr. Rudi Coco at [facility] to discuss options of IA time vs admitting him for VAD. Given blood type O - very low likelihood of getting an organ quickly so we have decided to pursue VAD implant in the near future. Will admit this week for aggressive tune-up with Swan and possible IABP. Will discuss timing of  VAD at [place] today - ideally would aim for next week, if possible. Will double lasix today.    Daniel Bensimhon,MD 11:23 AM

## 2013-09-12 ENCOUNTER — Other Ambulatory Visit (HOSPITAL_COMMUNITY): Payer: Self-pay | Admitting: Adult Health

## 2013-09-12 DIAGNOSIS — I5023 Acute on chronic systolic (congestive) heart failure: Secondary | ICD-10-CM

## 2013-09-13 ENCOUNTER — Inpatient Hospital Stay (HOSPITAL_COMMUNITY)
Admission: AD | Admit: 2013-09-13 | Discharge: 2013-10-03 | DRG: 001 | Disposition: A | Discharge status: Home Health | Payer: Medicare Other | Source: Ambulatory Visit | Attending: Cardiothoracic Surgery | Admitting: Cardiothoracic Surgery

## 2013-09-13 ENCOUNTER — Encounter (HOSPITAL_COMMUNITY)
Admission: AD | Disposition: A | Discharge status: Home Health | Payer: Self-pay | Source: Ambulatory Visit | Attending: Cardiothoracic Surgery

## 2013-09-13 ENCOUNTER — Encounter (HOSPITAL_COMMUNITY): Payer: Self-pay | Admitting: Respiratory Therapy

## 2013-09-13 ENCOUNTER — Inpatient Hospital Stay (HOSPITAL_COMMUNITY): Payer: Medicare Other

## 2013-09-13 ENCOUNTER — Encounter (HOSPITAL_COMMUNITY): Payer: Self-pay | Admitting: *Deleted

## 2013-09-13 DIAGNOSIS — Z72820 Sleep deprivation: Secondary | ICD-10-CM

## 2013-09-13 DIAGNOSIS — E039 Hypothyroidism, unspecified: Secondary | ICD-10-CM | POA: Diagnosis present

## 2013-09-13 DIAGNOSIS — R5381 Other malaise: Secondary | ICD-10-CM | POA: Diagnosis not present

## 2013-09-13 DIAGNOSIS — I2582 Chronic total occlusion of coronary artery: Secondary | ICD-10-CM | POA: Diagnosis present

## 2013-09-13 DIAGNOSIS — F411 Generalized anxiety disorder: Secondary | ICD-10-CM | POA: Diagnosis present

## 2013-09-13 DIAGNOSIS — Y9210 Unspecified residential institution as the place of occurrence of the external cause: Secondary | ICD-10-CM | POA: Diagnosis not present

## 2013-09-13 DIAGNOSIS — Z9861 Coronary angioplasty status: Secondary | ICD-10-CM | POA: Diagnosis not present

## 2013-09-13 DIAGNOSIS — R531 Weakness: Secondary | ICD-10-CM

## 2013-09-13 DIAGNOSIS — Y831 Surgical operation with implant of artificial internal device as the cause of abnormal reaction of the patient, or of later complication, without mention of misadventure at the time of the procedure: Secondary | ICD-10-CM | POA: Diagnosis not present

## 2013-09-13 DIAGNOSIS — N183 Chronic kidney disease, stage 3 unspecified: Secondary | ICD-10-CM | POA: Diagnosis present

## 2013-09-13 DIAGNOSIS — I129 Hypertensive chronic kidney disease with stage 1 through stage 4 chronic kidney disease, or unspecified chronic kidney disease: Secondary | ICD-10-CM | POA: Diagnosis present

## 2013-09-13 DIAGNOSIS — I079 Rheumatic tricuspid valve disease, unspecified: Secondary | ICD-10-CM | POA: Diagnosis present

## 2013-09-13 DIAGNOSIS — R509 Fever, unspecified: Secondary | ICD-10-CM | POA: Diagnosis not present

## 2013-09-13 DIAGNOSIS — E7800 Pure hypercholesterolemia, unspecified: Secondary | ICD-10-CM | POA: Diagnosis present

## 2013-09-13 DIAGNOSIS — Z888 Allergy status to other drugs, medicaments and biological substances status: Secondary | ICD-10-CM

## 2013-09-13 DIAGNOSIS — Z96659 Presence of unspecified artificial knee joint: Secondary | ICD-10-CM | POA: Diagnosis not present

## 2013-09-13 DIAGNOSIS — I2510 Atherosclerotic heart disease of native coronary artery without angina pectoris: Secondary | ICD-10-CM | POA: Diagnosis present

## 2013-09-13 DIAGNOSIS — Z823 Family history of stroke: Secondary | ICD-10-CM | POA: Diagnosis not present

## 2013-09-13 DIAGNOSIS — E876 Hypokalemia: Secondary | ICD-10-CM | POA: Diagnosis not present

## 2013-09-13 DIAGNOSIS — Z833 Family history of diabetes mellitus: Secondary | ICD-10-CM | POA: Diagnosis not present

## 2013-09-13 DIAGNOSIS — N179 Acute kidney failure, unspecified: Secondary | ICD-10-CM | POA: Diagnosis present

## 2013-09-13 DIAGNOSIS — Z95810 Presence of automatic (implantable) cardiac defibrillator: Secondary | ICD-10-CM

## 2013-09-13 DIAGNOSIS — J90 Pleural effusion, not elsewhere classified: Secondary | ICD-10-CM | POA: Diagnosis not present

## 2013-09-13 DIAGNOSIS — Z7982 Long term (current) use of aspirin: Secondary | ICD-10-CM | POA: Diagnosis not present

## 2013-09-13 DIAGNOSIS — I252 Old myocardial infarction: Secondary | ICD-10-CM

## 2013-09-13 DIAGNOSIS — R12 Heartburn: Secondary | ICD-10-CM | POA: Diagnosis present

## 2013-09-13 DIAGNOSIS — I509 Heart failure, unspecified: Secondary | ICD-10-CM

## 2013-09-13 DIAGNOSIS — K2289 Other specified disease of esophagus: Secondary | ICD-10-CM | POA: Diagnosis present

## 2013-09-13 DIAGNOSIS — R570 Cardiogenic shock: Secondary | ICD-10-CM | POA: Diagnosis not present

## 2013-09-13 DIAGNOSIS — D689 Coagulation defect, unspecified: Secondary | ICD-10-CM | POA: Diagnosis not present

## 2013-09-13 DIAGNOSIS — I255 Ischemic cardiomyopathy: Secondary | ICD-10-CM

## 2013-09-13 DIAGNOSIS — Z95811 Presence of heart assist device: Secondary | ICD-10-CM

## 2013-09-13 DIAGNOSIS — N189 Chronic kidney disease, unspecified: Secondary | ICD-10-CM

## 2013-09-13 DIAGNOSIS — K8020 Calculus of gallbladder without cholecystitis without obstruction: Secondary | ICD-10-CM | POA: Diagnosis present

## 2013-09-13 DIAGNOSIS — I2589 Other forms of chronic ischemic heart disease: Secondary | ICD-10-CM | POA: Diagnosis present

## 2013-09-13 DIAGNOSIS — R933 Abnormal findings on diagnostic imaging of other parts of digestive tract: Secondary | ICD-10-CM

## 2013-09-13 DIAGNOSIS — Z8249 Family history of ischemic heart disease and other diseases of the circulatory system: Secondary | ICD-10-CM

## 2013-09-13 DIAGNOSIS — I82619 Acute embolism and thrombosis of superficial veins of unspecified upper extremity: Secondary | ICD-10-CM | POA: Diagnosis not present

## 2013-09-13 DIAGNOSIS — R0609 Other forms of dyspnea: Secondary | ICD-10-CM | POA: Diagnosis present

## 2013-09-13 DIAGNOSIS — D649 Anemia, unspecified: Secondary | ICD-10-CM

## 2013-09-13 DIAGNOSIS — Z79899 Other long term (current) drug therapy: Secondary | ICD-10-CM

## 2013-09-13 DIAGNOSIS — I10 Essential (primary) hypertension: Secondary | ICD-10-CM

## 2013-09-13 DIAGNOSIS — I5022 Chronic systolic (congestive) heart failure: Secondary | ICD-10-CM

## 2013-09-13 DIAGNOSIS — K228 Other specified diseases of esophagus: Secondary | ICD-10-CM | POA: Diagnosis present

## 2013-09-13 DIAGNOSIS — E785 Hyperlipidemia, unspecified: Secondary | ICD-10-CM | POA: Diagnosis present

## 2013-09-13 DIAGNOSIS — Z7682 Awaiting organ transplant status: Secondary | ICD-10-CM | POA: Diagnosis not present

## 2013-09-13 DIAGNOSIS — R410 Disorientation, unspecified: Secondary | ICD-10-CM

## 2013-09-13 DIAGNOSIS — D62 Acute posthemorrhagic anemia: Secondary | ICD-10-CM | POA: Diagnosis not present

## 2013-09-13 DIAGNOSIS — Z7901 Long term (current) use of anticoagulants: Secondary | ICD-10-CM

## 2013-09-13 DIAGNOSIS — I5023 Acute on chronic systolic (congestive) heart failure: Secondary | ICD-10-CM | POA: Diagnosis present

## 2013-09-13 DIAGNOSIS — Z800 Family history of malignant neoplasm of digestive organs: Secondary | ICD-10-CM

## 2013-09-13 DIAGNOSIS — M1990 Unspecified osteoarthritis, unspecified site: Secondary | ICD-10-CM | POA: Diagnosis present

## 2013-09-13 DIAGNOSIS — J9589 Other postprocedural complications and disorders of respiratory system, not elsewhere classified: Secondary | ICD-10-CM | POA: Diagnosis not present

## 2013-09-13 DIAGNOSIS — F05 Delirium due to known physiological condition: Secondary | ICD-10-CM | POA: Diagnosis not present

## 2013-09-13 DIAGNOSIS — Z515 Encounter for palliative care: Secondary | ICD-10-CM

## 2013-09-13 DIAGNOSIS — I4891 Unspecified atrial fibrillation: Secondary | ICD-10-CM | POA: Diagnosis not present

## 2013-09-13 DIAGNOSIS — I059 Rheumatic mitral valve disease, unspecified: Secondary | ICD-10-CM | POA: Diagnosis present

## 2013-09-13 DIAGNOSIS — IMO0002 Reserved for concepts with insufficient information to code with codable children: Secondary | ICD-10-CM | POA: Diagnosis not present

## 2013-09-13 HISTORY — DX: Presence of automatic (implantable) cardiac defibrillator: Z95.810

## 2013-09-13 HISTORY — PX: RIGHT HEART CATHETERIZATION: SHX5447

## 2013-09-13 LAB — CBC WITH DIFFERENTIAL/PLATELET
Basophils Absolute: 0 10*3/uL (ref 0.0–0.1)
Basophils Relative: 0 % (ref 0–1)
Eosinophils Absolute: 0.1 10*3/uL (ref 0.0–0.7)
Eosinophils Relative: 2 % (ref 0–5)
HCT: 29 % — ABNORMAL LOW (ref 39.0–52.0)
Hemoglobin: 9 g/dL — ABNORMAL LOW (ref 13.0–17.0)
Lymphocytes Relative: 12 % (ref 12–46)
Lymphs Abs: 0.8 10*3/uL (ref 0.7–4.0)
MCH: 25.6 pg — ABNORMAL LOW (ref 26.0–34.0)
MCHC: 31 g/dL (ref 30.0–36.0)
MCV: 82.4 fL (ref 78.0–100.0)
Monocytes Absolute: 0.5 10*3/uL (ref 0.1–1.0)
Monocytes Relative: 7 % (ref 3–12)
Neutro Abs: 5.3 10*3/uL (ref 1.7–7.7)
Neutrophils Relative %: 79 % — ABNORMAL HIGH (ref 43–77)
Platelets: 264 10*3/uL (ref 150–400)
RBC: 3.52 MIL/uL — ABNORMAL LOW (ref 4.22–5.81)
RDW: 18.2 % — ABNORMAL HIGH (ref 11.5–15.5)
WBC: 6.8 10*3/uL (ref 4.0–10.5)

## 2013-09-13 LAB — COMPREHENSIVE METABOLIC PANEL
ALT: 109 U/L — ABNORMAL HIGH (ref 0–53)
AST: 116 U/L — ABNORMAL HIGH (ref 0–37)
Albumin: 3 g/dL — ABNORMAL LOW (ref 3.5–5.2)
Alkaline Phosphatase: 162 U/L — ABNORMAL HIGH (ref 39–117)
BUN: 41 mg/dL — ABNORMAL HIGH (ref 6–23)
CO2: 22 mEq/L (ref 19–32)
Calcium: 8.9 mg/dL (ref 8.4–10.5)
Chloride: 104 mEq/L (ref 96–112)
Creatinine, Ser: 2.07 mg/dL — ABNORMAL HIGH (ref 0.50–1.35)
GFR calc Af Amer: 36 mL/min — ABNORMAL LOW (ref >90)
GFR calc non Af Amer: 31 mL/min — ABNORMAL LOW (ref >90)
Glucose, Bld: 101 mg/dL — ABNORMAL HIGH (ref 70–99)
Potassium: 4.5 mEq/L (ref 3.7–5.3)
Sodium: 141 mEq/L (ref 137–147)
Total Bilirubin: 0.9 mg/dL (ref 0.3–1.2)
Total Protein: 6.9 g/dL (ref 6.0–8.3)

## 2013-09-13 LAB — URINALYSIS, ROUTINE W REFLEX MICROSCOPIC
Bilirubin Urine: NEGATIVE
Glucose, UA: NEGATIVE mg/dL
Hgb urine dipstick: NEGATIVE
Ketones, ur: NEGATIVE mg/dL
Leukocytes, UA: NEGATIVE
Nitrite: NEGATIVE
Protein, ur: NEGATIVE mg/dL
Specific Gravity, Urine: 1.01 (ref 1.005–1.030)
Urobilinogen, UA: 1 mg/dL (ref 0.0–1.0)
pH: 6 (ref 5.0–8.0)

## 2013-09-13 LAB — SURGICAL PCR SCREEN
MRSA, PCR: NEGATIVE
Staphylococcus aureus: NEGATIVE

## 2013-09-13 LAB — POCT I-STAT 3, ART BLOOD GAS (G3+)
Acid-base deficit: 1 mmol/L (ref 0.0–2.0)
Bicarbonate: 22.9 mEq/L (ref 20.0–24.0)
O2 Saturation: 60 %
TCO2: 24 mmol/L (ref 0–100)
pCO2 arterial: 35.8 mmHg (ref 35.0–45.0)
pH, Arterial: 7.413 (ref 7.350–7.450)
pO2, Arterial: 31 mmHg — CL (ref 80.0–100.0)

## 2013-09-13 LAB — POCT I-STAT 3, VENOUS BLOOD GAS (G3P V)
Acid-base deficit: 1 mmol/L (ref 0.0–2.0)
Bicarbonate: 23 mEq/L (ref 20.0–24.0)
O2 Saturation: 58 %
TCO2: 24 mmol/L (ref 0–100)
pCO2, Ven: 36.3 mmHg — ABNORMAL LOW (ref 45.0–50.0)
pH, Ven: 7.409 — ABNORMAL HIGH (ref 7.250–7.300)
pO2, Ven: 30 mmHg (ref 30.0–45.0)

## 2013-09-13 LAB — MAGNESIUM: Magnesium: 2 mg/dL (ref 1.5–2.5)

## 2013-09-13 LAB — PRO B NATRIURETIC PEPTIDE: Pro B Natriuretic peptide (BNP): 3883 pg/mL — ABNORMAL HIGH (ref 0–125)

## 2013-09-13 LAB — PROTIME-INR
INR: 1.29 (ref 0.00–1.49)
Prothrombin Time: 15.8 seconds — ABNORMAL HIGH (ref 11.6–15.2)

## 2013-09-13 LAB — TSH: TSH: 12.878 u[IU]/mL — ABNORMAL HIGH (ref 0.350–4.500)

## 2013-09-13 SURGERY — RIGHT HEART CATH
Anesthesia: LOCAL

## 2013-09-13 MED ORDER — SODIUM CHLORIDE 0.9 % IV SOLN: 250.0000 mL | INTRAVENOUS | Status: DC | PRN

## 2013-09-13 MED ORDER — SODIUM CHLORIDE 0.9 % IJ SOLN: 3.0000 mL | INTRAMUSCULAR | Status: DC | PRN

## 2013-09-13 MED ORDER — SODIUM CHLORIDE 0.9 % IJ SOLN: 3.0000 mL | Freq: Two times a day (BID) | INTRAMUSCULAR | Status: DC

## 2013-09-13 MED ORDER — ACETAMINOPHEN 325 MG PO TABS: 650.0000 mg | ORAL_TABLET | ORAL | Status: DC | PRN

## 2013-09-13 MED ORDER — MILRINONE IN DEXTROSE 20 MG/100ML IV SOLN
0.2500 ug/kg/min | INTRAVENOUS | Status: DC
  Administered 2013-09-13 – 2013-09-16 (×8): 0.375 ug/kg/min via INTRAVENOUS
  Administered 2013-09-17 – 2013-09-18 (×2): 0.25 ug/kg/min via INTRAVENOUS
  Filled 2013-09-13 (×10): qty 100

## 2013-09-13 MED ORDER — EZETIMIBE 10 MG PO TABS
10.0000 mg | ORAL_TABLET | Freq: Every day | ORAL | Status: DC
  Administered 2013-09-14 – 2013-10-03 (×19): 10 mg via ORAL
  Filled 2013-09-13 (×21): qty 1

## 2013-09-13 MED ORDER — SODIUM CHLORIDE 0.9 % IJ SOLN
3.0000 mL | Freq: Two times a day (BID) | INTRAMUSCULAR | Status: DC
  Administered 2013-09-13: 10 mL via INTRAVENOUS
  Administered 2013-09-17 (×2): 3 mL via INTRAVENOUS

## 2013-09-13 MED ORDER — HEPARIN (PORCINE) IN NACL 2-0.9 UNIT/ML-% IJ SOLN
INTRAMUSCULAR | Status: AC
  Filled 2013-09-13: qty 500

## 2013-09-13 MED ORDER — ISOSORBIDE MONONITRATE 15 MG HALF TABLET
15.0000 mg | ORAL_TABLET | Freq: Every day | ORAL | Status: DC
  Administered 2013-09-13: 15 mg via ORAL
  Filled 2013-09-13: qty 1

## 2013-09-13 MED ORDER — SODIUM CHLORIDE 0.9 % IV SOLN: INTRAVENOUS | Status: DC

## 2013-09-13 MED ORDER — ENOXAPARIN SODIUM 30 MG/0.3ML ~~LOC~~ SOLN
30.0000 mg | SUBCUTANEOUS | Status: DC
  Administered 2013-09-13: 30 mg via SUBCUTANEOUS
  Filled 2013-09-13 (×2): qty 0.3

## 2013-09-13 MED ORDER — ONDANSETRON HCL 4 MG/2ML IJ SOLN: 4.0000 mg | Freq: Four times a day (QID) | INTRAMUSCULAR | Status: DC | PRN

## 2013-09-13 MED ORDER — FUROSEMIDE 10 MG/ML IJ SOLN
40.0000 mg | Freq: Once | INTRAMUSCULAR | Status: AC
  Administered 2013-09-13: 40 mg via INTRAVENOUS
  Filled 2013-09-13: qty 4

## 2013-09-13 MED ORDER — CARVEDILOL 3.125 MG PO TABS
3.1250 mg | ORAL_TABLET | Freq: Two times a day (BID) | ORAL | Status: DC
  Administered 2013-09-14: 3.125 mg via ORAL
  Filled 2013-09-13 (×4): qty 1

## 2013-09-13 MED ORDER — SILDENAFIL CITRATE 20 MG PO TABS
20.0000 mg | ORAL_TABLET | Freq: Three times a day (TID) | ORAL | Status: DC
  Administered 2013-09-13 – 2013-09-17 (×12): 20 mg via ORAL
  Filled 2013-09-13 (×19): qty 1

## 2013-09-13 MED ORDER — ENOXAPARIN SODIUM 30 MG/0.3ML ~~LOC~~ SOLN: 30.0000 mg | SUBCUTANEOUS | Status: DC

## 2013-09-13 MED ORDER — ALTEPLASE 100 MG IV SOLR
4.0000 mg | Freq: Once | INTRAVENOUS | Status: DC
  Filled 2013-09-13: qty 4

## 2013-09-13 MED ORDER — HYDRALAZINE HCL 25 MG PO TABS
25.0000 mg | ORAL_TABLET | Freq: Three times a day (TID) | ORAL | Status: DC
  Administered 2013-09-13 – 2013-09-14 (×4): 25 mg via ORAL
  Filled 2013-09-13 (×9): qty 1

## 2013-09-13 MED ORDER — FENTANYL CITRATE 0.05 MG/ML IJ SOLN
INTRAMUSCULAR | Status: AC
  Filled 2013-09-13: qty 2

## 2013-09-13 MED ORDER — ASPIRIN 81 MG PO CHEW: 81.0000 mg | CHEWABLE_TABLET | ORAL | Status: DC

## 2013-09-13 MED ORDER — SODIUM CHLORIDE 0.9 % IV SOLN
250.0000 mL | INTRAVENOUS | Status: DC | PRN
  Administered 2013-09-13: 250 mL via INTRAVENOUS
  Administered 2013-09-16: 16:00:00 via INTRAVENOUS

## 2013-09-13 MED ORDER — ACETAMINOPHEN 325 MG PO TABS
650.0000 mg | ORAL_TABLET | ORAL | Status: DC | PRN
  Administered 2013-09-16 – 2013-09-17 (×2): 650 mg via ORAL
  Filled 2013-09-13 (×2): qty 2

## 2013-09-13 MED ORDER — LIDOCAINE HCL (PF) 1 % IJ SOLN
INTRAMUSCULAR | Status: AC
  Filled 2013-09-13: qty 30

## 2013-09-13 MED ORDER — ZOLPIDEM TARTRATE 5 MG PO TABS
5.0000 mg | ORAL_TABLET | Freq: Every evening | ORAL | Status: DC | PRN
  Administered 2013-09-13 – 2013-09-15 (×3): 5 mg via ORAL
  Filled 2013-09-13 (×3): qty 1

## 2013-09-13 MED ORDER — MIDAZOLAM HCL 2 MG/2ML IJ SOLN
INTRAMUSCULAR | Status: AC
  Filled 2013-09-13: qty 2

## 2013-09-13 NOTE — CV Procedure (Signed)
Cardiac Cath Procedure Note:  Indication:  Heart failure  Procedures performed:  1) Right heart catheterization  Description of procedure:   The risks and indication of the procedure were explained. Consent was signed and placed on the chart. An appropriate timeout was taken prior to the procedure. The right neck was prepped and draped in the routine sterile fashion and anesthetized with 1% local lidocaine.   A 8 FR venous sheath was placed in the right internal jugular vein using a modified Seldinger technique. A standard Swan-Ganz catheter was used for the procedure.   Complications: None apparent.  Findings:  Done on milrinone 0.375 mcg/kg/min  RA = 9 RV = 53/4/10 PA = 55/24 (34) PCW = 16 Fick cardiac output/index = 6.1/3.3 Thermo CO/CI = 4.3/2.4  PVR = 4.2 Woods FA sat = 92% PA sat = 58%, 60%  Assessment: 1. Well compensated hemodynamics on milrinone with slightly elevated PVR  Plan/Discussion:  Suspect worsening renal function may be due to overdiuresis. Will hold diuretics. Hydrate gently. Leave swan in. Start sildenafil. Possible VAD  Next week pending on renal function.   Arminda Foglio,MD 6:10 PM     Glori Bickers 6:06 PM

## 2013-09-13 NOTE — H&P (Signed)
Advanced Heart Failure H&P   HPI: [name] is a 73 year old male with history of CAD s/p stent to LCX 2004 with re-occlusion of LCX with lateral MI 2009 and repeat stenting, total occlusion of RCA, 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 S/P L TKR 2013. Last Cath 2009.  Admitted April 2014 with cardiogenic shock. He was later discharged on Milrinone at 0.25 mcg via PICC. He was been evaluated at [facility] for heart transplant and currently on transplant list.   He has been followed closely in the HF clinic as well as [place]. On 08/01/13 he was evaluated at [facility] and had decompensated HF. At that time Milrinone was increased to 0.375 mcg.  He was evaluated in the HF clinic 12/23 for increased fatigue and later had a RHC on 12/26 which showed increased PVR and elevated filling pressure. Revatio was recommended at time. He returned to the HF clinic 09/11/13 for an acute visit due to increased fatigue and functional decline. He was only able to walk a few yards.  Also his renal function has been getting worse. Weight at home 160-162 pounds.   Complains of fatigue and dyspnea on exertion.     10/2012 ECHO EF 25-30%. Mild to mod MR. RV ok 11/08/2012 Myoview- Large scar present with prior MI  11/14/12 CPX Peak VO2: 16.2 ml/kg/min predicted peak VO2: 58.4% VE/VCO2 slope: 46. OUES: 1.48 Peak RER: 1.15 Ventilatory Threshold: 10.2 % predicted peak VO2: 36.8%  Blood Type O negative  RHC 08/25/13  RA = 7  RV = 67/5/8  PA = 66/26 (42)  PCW = 21  Fick cardiac output/index = 5.6/3.1  Thermo Co/CI = 4.4/2.4  PVR = 3.75 (Fick) 4.8 (thermo)  FA sat = 85% (after sedation)  PA sat = 50%, 56%    Review of Systems:     Cardiac Review of Systems: {Y] = yes [ ]  = no  Chest Pain [    ]  Resting SOB [   ] Exertional SOB  [ Y ]  Orthopnea [ Y ]   Pedal Edema [  Y ]    Palpitations [  ] Syncope  [  ]   Presyncope [    ]  General Review of Systems: [Y] = yes [  ]=no Constitional: recent weight change [ Y ]; anorexia [  ]; fatigue [Y  ]; nausea [  ]; night sweats [  ]; fever [  ]; or chills [  ];                                                                                                                                          Dental: poor dentition[  ]; Last Dentist visit:   Eye : blurred vision [  ]; diplopia [   ]; vision changes [  ];  Amaurosis fugax[  ]; Resp: cough [ Y ];  wheezing[  ];  hemoptysis[  ]; shortness of breath[ Y ]; paroxysmal nocturnal dyspnea[  ]; dyspnea on exertion[ Y ]; or orthopnea[Y  ];  GI:  gallstones[  ], vomiting[  ];  dysphagia[  ]; melena[  ];  hematochezia [  ]; heartburn[  ];   Hx of  Colonoscopy[  ]; GU: kidney stones [  ]; hematuria[  ];   dysuria [  ];  nocturia[  ];  history of     obstruction [  ];                 Skin: rash, swelling[  ];, hair loss[  ];  peripheral edema[  ];  or itching[  ]; Musculosketetal: myalgias[  ];  joint swelling[  ];  joint erythema[  ];  joint pain[  ];  back pain[  ];  Heme/Lymph: bruising[  ];  bleeding[  ];  anemia[  ];  Neuro: TIA[  ];  headaches[  ];  stroke[  ];  vertigo[  ];  seizures[  ];   paresthesias[  ];  difficulty walking[  ];  Psych:depression[  ]; anxiety[  ];  Endocrine: diabetes[  ];  thyroid dysfunction[ Y ];  Immunizations: Flu [ Y ]; Pneumococcal[ Y ];  Other:  Past Medical History  Diagnosis Date  . Ischemic cardiomyopathy     a. 10/09 Echo: Sev LV Dysfxn, inf/lat AK, Mod MR.  [name];  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 Hypertension   . PVC (premature ventricular  contraction)   . Pacemaker   . CHF (congestive heart failure)   . Shortness of breath     Medications Prior to Admission  Medication Sig Dispense Refill  . amiodarone (PACERONE) 200 MG tablet Take 1 tablet (200 mg total) by mouth 2 (two) times daily.  60 tablet  6  . aspirin 325 MG tablet Take 325 mg by mouth daily.        . carvedilol (COREG) 3.125 MG tablet Take 1 tablet (3.125 mg total) by mouth 2 (two) times daily with a meal.  60 tablet  6  . Coenzyme Q10 (CO Q-10 PO) Take 1 capsule by mouth 2 (two) times daily.       [name] ezetimibe (ZETIA) 10 MG tablet Take 10 mg by mouth daily.      . furosemide (LASIX) 20 MG tablet Take 20 mg by mouth daily as needed (for 3 lbs or more weight gain per day or 5 lbs or more weight gain per week or as instructed by Dr. Tempie Hoist.).      [name] hydrALAZINE (APRESOLINE) 25 MG tablet Take 1 tablet (25 mg total) by mouth 3 (three) times daily.  90 tablet  3  . isosorbide mononitrate (IMDUR) 30 MG 24 hr tablet Take 0.5 tablets (15 mg total) by mouth daily.  45 tablet  3  . levothyroxine (SYNTHROID, LEVOTHROID) 150 MCG tablet 75-150 mcg daily before breakfast. Take 1 tablet (  150 mcg) daily on an empty stomach except take 1/2 tablet (75 mcg) every Friday as directed.      . milrinone (PRIMACOR) 20 MG/100ML SOLN infusion Inject 0.375 mcg/kg/min into the vein continuous.      . nitroGLYCERIN (NITROSTAT) 0.4 MG SL tablet Place 0.4 mg under the tongue every 5 (five) minutes as needed. For chest pain      . Omega-3 300 MG CAPS Take 300 mg by mouth daily.       . potassium chloride SA (K-DUR,KLOR-CON) 20 MEQ tablet Take 1 tablet (20 mEq total) by mouth as needed. Take only when you take Furosemide  30 tablet  3  . rosuvastatin (CRESTOR) 20 MG tablet Take 20 mg by mouth daily.         Allergies  Allergen Reactions  . Diovan [Valsartan] Other (See Comments)    Hypotension at low dose, hyperkalemia  . Lipitor [Atorvastatin Calcium] Other (See Comments)    Muscle pain     History   Social History  . Marital Status: Married    Spouse Name: N/A    Number of Children: 1  . Years of Education: N/A   Occupational History  . referee   . cleaning company    Social History Main Topics  . Smoking status: Never Smoker   . Smokeless tobacco: Never Used  . Alcohol Use: Yes     Comment: "occassionlly; last beer was 09/2011"  . Drug Use: No     Comment: "back in our younger days we smoked a little pot"  . Sexual Activity: Not Currently   Other Topics Concern  . Not on file   Social History Narrative  . No narrative on file    Family History  Problem Relation Age of Onset  . Heart failure Father   . Stroke Father   . Coronary artery disease Mother   . Heart disease Mother   . Hyperlipidemia Sister     PHYSICAL EXAM: Filed Vitals:   09/13/13 1315  BP: 104/59  Pulse: 75  Temp:   Resp: 20   General:  Fatigued appearing.  No respiratory difficulty. Wife present  HEENT: normal Neck: supple. JVD ~10-11. Carotids 2+ bilat; no bruits. No lymphadenopathy or thryomegaly appreciated. Cor: PMI nondisplaced. Regular rate & rhythm. No rubs, gallops 3/6 murmur Lungs: clear Abdomen: soft, nontender, nondistended. No hepatosplenomegaly. No bruits or masses. Good bowel sounds. Extremities: no cyanosis, clubbing, rash, R and LLE 3+ edema Neuro: alert & oriented x 3, cranial nerves grossly intact. moves all 4 extremities w/o difficulty. Affect pleasant.  ECG: SR   No results found for this or any previous visit (from the past 24 hour(s)). No results found.   ASSESSMENT: 1. A/C Systolic Heart Failure ECHO EF 20-25% INTERMACS 2 on transplant list [facility]  2. Hypothyroid  3.CAD s/p stent to LCX 2004 with re-occlusion of LCX with lateral MI 2009 and repeat stenting with a Promus, total occlusion of RCA  4. S/P ICD 2009  5. Hyperlipidemia  6. Arthritis S/P TKR 2013  7. Blood Type O negative 8. PVCs    PLAN/DISCUSSION: Mr Cadotte is admitted today due to  decompensated heart failure and worsening renal function.  NYHA IV. Plan for RHC tomorrow to reassesses hemodynamics and optimize HF in the event he needs a LVAD.   Continue Milrinone at 0.375 mcg via PICC and follow CVPs. After RHC tomorrow will remove PICC and leave swan. Volume status elevated.Give 40 mg IV lasix now.  Continue  low dose carvedilol. Continue hydralazine/imdur. No ace or spironolactone due to CKD. Check labs CMET, CBC, TSH, pro BNP.   Check CT chest/abdomen/pelvis for LVAD work up. Jilda Roche VAD coordinator to meet with him and obtain consent.   CLEGG,AMYNP-C 2:09 PM  Patient seen and examined with Darrick Grinder, NP. We discussed all aspects of the encounter. I agree with the assessment and plan as stated above.   He continues to deteriorate despite inotrope support. Renal function worse. Will plan RHC and likely IABP support. D/w Dr. Emelia Loron at [facility] and agree with plan for bridge LVAD.  Shauntay Brunelli,MD 5:07 PM

## 2013-09-13 NOTE — Progress Notes (Signed)
CSW received consult that pt needs a VAD evaluation. CSW made referral to the CSW that completes these, Butch Penny [phone]). This CSW signing off.     Ky Barban, MSW, [facility] Clinical Social Worker [phone]

## 2013-09-13 NOTE — Interval H&P Note (Signed)
History and Physical Interval Note:  09/13/2013 6:06 PM  Johnny Oneill  has presented today for surgery, with the diagnosis of Heart failure  The various methods of treatment have been discussed with the patient and family. After consideration of risks, benefits and other options for treatment, the patient has consented to  Procedure(s): RIGHT HEART CATH (N/A) and possible IABP as a surgical intervention .  The patient's history has been reviewed, patient examined, no change in status, stable for surgery.  I have reviewed the patient's chart and labs.  Questions were answered to the patient's satisfaction.     Daniel Bensimhon

## 2013-09-13 NOTE — Progress Notes (Signed)
[facility]  Patient Status: Active (receiving services up to time of hospitalization)  [facility] is providing the following services: RN and Home Infusion Services (teaching and education will be done by nurse in the home with patient and caregiver)  If patient discharges after hours, please call [phone].   Janae Sauce 09/13/2013, 2:08 PM

## 2013-09-13 NOTE — Care Management Note (Addendum)
    Page 1 of 2   10/03/2013     3:23:48 PM   CARE MANAGEMENT NOTE 10/03/2013  Patient:  Johnny Oneill, Johnny Oneill   Account Number:  [id number]  Date Initiated:  09/13/2013  Documentation initiated by:  Elissa Hefty  Subjective/Objective Assessment:   adm w chf     Action/Plan:   lives w wife, act w [facility] for hhrn   Anticipated DC Date:  10/03/2013   Anticipated DC Plan:  [place]  CM consult      [facility] Choice  Resumption Of Svcs/PTA Provider   Choice offered to / List presented to:  C-1 Patient   DME arranged  Vassie Moselle      DME agency  [place] arranged  HH-1 RN  HH-10 DISEASE MANAGEMENT  HH-2 PT  HH-3 OT      [place].   Status of service:  Completed, signed off Medicare Important Message given?   (If response is "NO", the following Medicare IM given date fields will be blank) Date Medicare IM given:   Date Additional Medicare IM given:    Discharge Disposition:  [place]  Per UR Regulation:  Reviewed for med. necessity/level of care/duration of stay  If discussed at [place] of Stay Meetings, dates discussed:   09/19/2013  09/21/2013  09/26/2013  09/28/2013  10/03/2013    Comments:  10/03/13 Johnny Gerding,RN,BSN [id number] PT FOR DC HOME TODAY WITH SPOUSE.  [facility] NOTIFIED OF DC DATE AND NEW ORDERS.  ROLLATOR WALKER TO BE DELIVERED TO PT'S ROOM PRIOR TO DC.  PT DENIES ANY OTHER DME NEEDS FOR HOME.   09-19-13 [place] Post op Lvad on 09-18-13  1/14 1410 debbie dowell rn,bsn alerted [facility] of adm. will follow for dc plans.

## 2013-09-14 ENCOUNTER — Encounter (HOSPITAL_COMMUNITY)
Admission: AD | Disposition: A | Discharge status: Home Health | Payer: Self-pay | Source: Ambulatory Visit | Attending: Cardiothoracic Surgery

## 2013-09-14 ENCOUNTER — Inpatient Hospital Stay (HOSPITAL_COMMUNITY): Payer: Medicare Other

## 2013-09-14 DIAGNOSIS — I2589 Other forms of chronic ischemic heart disease: Secondary | ICD-10-CM

## 2013-09-14 DIAGNOSIS — R933 Abnormal findings on diagnostic imaging of other parts of digestive tract: Secondary | ICD-10-CM

## 2013-09-14 DIAGNOSIS — R5383 Other fatigue: Secondary | ICD-10-CM

## 2013-09-14 DIAGNOSIS — I059 Rheumatic mitral valve disease, unspecified: Secondary | ICD-10-CM

## 2013-09-14 DIAGNOSIS — R570 Cardiogenic shock: Secondary | ICD-10-CM

## 2013-09-14 DIAGNOSIS — Z515 Encounter for palliative care: Secondary | ICD-10-CM

## 2013-09-14 DIAGNOSIS — D649 Anemia, unspecified: Secondary | ICD-10-CM

## 2013-09-14 DIAGNOSIS — I5023 Acute on chronic systolic (congestive) heart failure: Secondary | ICD-10-CM

## 2013-09-14 DIAGNOSIS — R5381 Other malaise: Secondary | ICD-10-CM

## 2013-09-14 HISTORY — PX: INTRA-AORTIC BALLOON PUMP INSERTION: SHX5475

## 2013-09-14 LAB — CARBOXYHEMOGLOBIN
Carboxyhemoglobin: 1.5 % (ref 0.5–1.5)
Carboxyhemoglobin: 1.6 % — ABNORMAL HIGH (ref 0.5–1.5)
Carboxyhemoglobin: 1.5 % (ref 0.5–1.5)
Methemoglobin: 0.9 % (ref 0.0–1.5)
Methemoglobin: 1.5 % (ref 0.0–1.5)
Methemoglobin: 1 % (ref 0.0–1.5)
O2 Saturation: 46.3 %
O2 Saturation: 34.3 %
O2 Saturation: 48.1 %
Total hemoglobin: 7.8 g/dL — ABNORMAL LOW (ref 13.5–18.0)
Total hemoglobin: 8.9 g/dL — ABNORMAL LOW (ref 13.5–18.0)
Total hemoglobin: 9.2 g/dL — ABNORMAL LOW (ref 13.5–18.0)

## 2013-09-14 LAB — BLOOD GAS, ARTERIAL
Acid-Base Excess: 0.3 mmol/L (ref 0.0–2.0)
Bicarbonate: 23 mEq/L (ref 20.0–24.0)
Drawn by: 31276
FIO2: 0.21 %
O2 Saturation: 96.3 %
Patient temperature: 98.6
TCO2: 23.8 mmol/L (ref 0–100)
pCO2 arterial: 28.5 mmHg — ABNORMAL LOW (ref 35.0–45.0)
pH, Arterial: 7.517 — ABNORMAL HIGH (ref 7.350–7.450)
pO2, Arterial: 80.1 mmHg (ref 80.0–100.0)

## 2013-09-14 LAB — HEMOGLOBIN A1C
Hgb A1c MFr Bld: 5.6 % (ref <5.7)
Mean Plasma Glucose: 114 mg/dL (ref <117)

## 2013-09-14 LAB — BASIC METABOLIC PANEL
BUN: 40 mg/dL — ABNORMAL HIGH (ref 6–23)
BUN: 35 mg/dL — ABNORMAL HIGH (ref 6–23)
CO2: 24 mEq/L (ref 19–32)
CO2: 25 mEq/L (ref 19–32)
Calcium: 8.2 mg/dL — ABNORMAL LOW (ref 8.4–10.5)
Calcium: 8.6 mg/dL (ref 8.4–10.5)
Chloride: 104 mEq/L (ref 96–112)
Chloride: 102 mEq/L (ref 96–112)
Creatinine, Ser: 2.02 mg/dL — ABNORMAL HIGH (ref 0.50–1.35)
Creatinine, Ser: 1.83 mg/dL — ABNORMAL HIGH (ref 0.50–1.35)
GFR calc Af Amer: 37 mL/min — ABNORMAL LOW (ref >90)
GFR calc Af Amer: 41 mL/min — ABNORMAL LOW (ref >90)
GFR calc non Af Amer: 32 mL/min — ABNORMAL LOW (ref >90)
GFR calc non Af Amer: 36 mL/min — ABNORMAL LOW (ref >90)
Glucose, Bld: 92 mg/dL (ref 70–99)
Glucose, Bld: 91 mg/dL (ref 70–99)
Potassium: 3.9 mEq/L (ref 3.7–5.3)
Potassium: 3.9 mEq/L (ref 3.7–5.3)
Sodium: 141 mEq/L (ref 137–147)
Sodium: 141 mEq/L (ref 137–147)

## 2013-09-14 LAB — LACTATE DEHYDROGENASE: LDH: 302 U/L — ABNORMAL HIGH (ref 94–250)

## 2013-09-14 LAB — SEDIMENTATION RATE: Sed Rate: 33 mm/hr — ABNORMAL HIGH (ref 0–16)

## 2013-09-14 LAB — LIPID PANEL
Cholesterol: 59 mg/dL (ref 0–200)
HDL: 24 mg/dL — ABNORMAL LOW (ref >39)
LDL Cholesterol: 22 mg/dL (ref 0–99)
Total CHOL/HDL Ratio: 2.5 RATIO
Triglycerides: 65 mg/dL (ref <150)
VLDL: 13 mg/dL (ref 0–40)

## 2013-09-14 LAB — MAGNESIUM: Magnesium: 2 mg/dL (ref 1.5–2.5)

## 2013-09-14 LAB — ANTITHROMBIN III: AntiThromb III Func: 71 % — ABNORMAL LOW (ref 75–120)

## 2013-09-14 LAB — PSA: PSA: 2.37 ng/mL (ref <=4.00)

## 2013-09-14 LAB — INFLUENZA PANEL BY PCR (TYPE A & B)
H1N1 flu by pcr: NOT DETECTED
Influenza A By PCR: NEGATIVE
Influenza B By PCR: NEGATIVE

## 2013-09-14 LAB — URIC ACID: Uric Acid, Serum: 7.8 mg/dL (ref 4.0–7.8)

## 2013-09-14 LAB — PREALBUMIN: Prealbumin: 11.8 mg/dL — ABNORMAL LOW (ref 17.0–34.0)

## 2013-09-14 LAB — HEPARIN LEVEL (UNFRACTIONATED): Heparin Unfractionated: 0.18 IU/mL — ABNORMAL LOW (ref 0.30–0.70)

## 2013-09-14 SURGERY — INTRA-AORTIC BALLOON PUMP INSERTION
Anesthesia: LOCAL

## 2013-09-14 MED ORDER — HEPARIN SODIUM (PORCINE) 1000 UNIT/ML IJ SOLN
INTRAMUSCULAR | Status: AC
  Filled 2013-09-14: qty 1

## 2013-09-14 MED ORDER — FUROSEMIDE 10 MG/ML IJ SOLN: 40.0000 mg | Freq: Two times a day (BID) | INTRAMUSCULAR | Status: DC

## 2013-09-14 MED ORDER — LEVOTHYROXINE SODIUM 75 MCG PO TABS
75.0000 ug | ORAL_TABLET | ORAL | Status: DC
  Administered 2013-09-15 – 2013-09-22 (×2): 75 ug via ORAL
  Filled 2013-09-14 (×3): qty 1

## 2013-09-14 MED ORDER — MIDAZOLAM HCL 2 MG/2ML IJ SOLN
INTRAMUSCULAR | Status: AC
  Filled 2013-09-14: qty 2

## 2013-09-14 MED ORDER — FENTANYL CITRATE 0.05 MG/ML IJ SOLN
INTRAMUSCULAR | Status: AC
Start: 2013-09-14 — End: 2013-09-14
  Filled 2013-09-14: qty 2

## 2013-09-14 MED ORDER — LIDOCAINE HCL (PF) 1 % IJ SOLN
INTRAMUSCULAR | Status: AC
  Filled 2013-09-14: qty 30

## 2013-09-14 MED ORDER — ACETAMINOPHEN 325 MG PO TABS: 650.0000 mg | ORAL_TABLET | ORAL | Status: DC | PRN

## 2013-09-14 MED ORDER — ONDANSETRON HCL 4 MG/2ML IJ SOLN: 4.0000 mg | Freq: Four times a day (QID) | INTRAMUSCULAR | Status: DC | PRN

## 2013-09-14 MED ORDER — HEPARIN (PORCINE) IN NACL 100-0.45 UNIT/ML-% IJ SOLN
1100.0000 [IU]/h | INTRAMUSCULAR | Status: DC
  Administered 2013-09-14: 1100 [IU]/h via INTRAVENOUS
  Filled 2013-09-14: qty 250

## 2013-09-14 MED ORDER — FUROSEMIDE 10 MG/ML IJ SOLN
80.0000 mg | Freq: Once | INTRAMUSCULAR | Status: AC
  Administered 2013-09-14: 80 mg via INTRAVENOUS

## 2013-09-14 MED ORDER — HEPARIN (PORCINE) IN NACL 2-0.9 UNIT/ML-% IJ SOLN
INTRAMUSCULAR | Status: AC
  Filled 2013-09-14: qty 500

## 2013-09-14 MED ORDER — SODIUM CHLORIDE 0.9 % IJ SOLN: 3.0000 mL | Freq: Two times a day (BID) | INTRAMUSCULAR | Status: DC

## 2013-09-14 MED ORDER — SODIUM CHLORIDE 0.9 % IV SOLN
INTRAVENOUS | Status: DC
  Administered 2013-09-16 (×2): via INTRAVENOUS

## 2013-09-14 MED ORDER — FUROSEMIDE 10 MG/ML IJ SOLN
INTRAMUSCULAR | Status: AC
  Filled 2013-09-14: qty 8

## 2013-09-14 MED ORDER — LEVOTHYROXINE SODIUM 150 MCG PO TABS
150.0000 ug | ORAL_TABLET | ORAL | Status: DC
  Administered 2013-09-16 – 2013-09-24 (×7): 150 ug via ORAL
  Filled 2013-09-14 (×12): qty 1

## 2013-09-14 MED ORDER — FUROSEMIDE 10 MG/ML IJ SOLN
40.0000 mg | Freq: Two times a day (BID) | INTRAMUSCULAR | Status: DC
  Administered 2013-09-14: 40 mg via INTRAVENOUS
  Filled 2013-09-14 (×3): qty 4

## 2013-09-14 MED ORDER — SODIUM CHLORIDE 0.9 % IV SOLN: 250.0000 mL | INTRAVENOUS | Status: DC | PRN

## 2013-09-14 MED ORDER — HEPARIN (PORCINE) IN NACL 100-0.45 UNIT/ML-% IJ SOLN
1600.0000 [IU]/h | INTRAMUSCULAR | Status: DC
  Administered 2013-09-15: 1350 [IU]/h via INTRAVENOUS
  Administered 2013-09-16: 1550 [IU]/h via INTRAVENOUS
  Administered 2013-09-16 – 2013-09-17 (×3): 1600 [IU]/h via INTRAVENOUS
  Filled 2013-09-14 (×8): qty 250

## 2013-09-14 MED ORDER — SODIUM CHLORIDE 0.9 % IJ SOLN: 3.0000 mL | INTRAMUSCULAR | Status: DC | PRN

## 2013-09-14 NOTE — Progress Notes (Signed)
Pharmacist Heart Failure Core Measure Documentation  Assessment: Johnny Oneill has an EF documented as 20-30% on 09/14/2013 by ECHO.  Rationale: Heart failure patients with left ventricular systolic dysfunction (LVSD) and an EF < 40% should be prescribed an angiotensin converting enzyme inhibitor (ACEI) or angiotensin receptor blocker (ARB) at discharge unless a contraindication is documented in the medical record.  This patient is not currently on an ACEI or ARB for HF.  This note is being placed in the record in order to provide documentation that a contraindication to the use of these agents is present for this encounter.  ACE Inhibitor or Angiotensin Receptor Blocker is contraindicated (specify all that apply)  []   ACEI allergy AND ARB allergy []   Angioedema []   Moderate or severe aortic stenosis []   Hyperkalemia []   Hypotension []   Renal artery stenosis [x]   Worsening renal function, preexisting renal disease or dysfunction   Albertina Parr, PharmD.  Clinical Pharmacist Pager [phone]

## 2013-09-14 NOTE — Progress Notes (Signed)
VAD evaluation consent reviewed and signed by pt and husband/wife. Initial VAD teaching completed with pt and caregiver. VAD teaching packet left at bedside for their review. Patient and caregiver asked questions and had good interaction with VAD coordinator; all questions answered regarding VAD implant,  hospital stay, and what to expect when discharged home. Pt identified wife as primary caregiver. Explained need for 24/7 care when pt discharged home;  both pt and caregiver(s) verbalized understanding of above. Both visited one of our current VAD patients in his home yesterday and had all questions answered.  Explained that LVAD can be implanted for two indications: 1. Bridge to transplant - used for patients who cannot safely wait for heart transplant.  2. Destination therapy - used for patients until end of life or recovery of heart function.  Patient and caregiver acknowledge understanding that pt will be getting LVAD as BTT. Pt is currently listed as status 1B on heart transplant waitlist at [facility].

## 2013-09-14 NOTE — Progress Notes (Signed)
Advanced Heart Failure Rounding Note   Subjective:    [name] is a 73 year old male with history of CAD s/p stent to LCX 2004 with re-occlusion of LCX with lateral MI 2009 and repeat stenting, total occlusion of RCA, 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 S/P L TKR 2013. Last Cath 2009.  He has been followed closely in the HF clinic as well as [place]. On 08/01/13 he was evaluated at [facility] and had decompensated HF. At that time Milrinone was increased to 0.375 mcg. He was evaluated in the HF clinic 12/23 for increased fatigue and later had a RHC on 12/26 which showed increased PVR and elevated filling pressure. Revatio was recommended at time. He returned to the HF clinic 09/11/13 for an acute visit due to increased fatigue and functional decline. He was only able to walk a few yards. Also his renal function has been getting worse. Weight at home 160-162 pounds.   1/15 Swan placement. Done on milrinone 0.375 mcg/kg/min  RA = 9  RV = 53/4/10  PA = 55/24 (34)  PCW = 16  Fick cardiac output/index = 6.1/3.3  Thermo CO/CI = 4.3/2.4  PVR = 4.2 [place]  FA sat = 92%  PA sat = 58%, 60% RA/PCWP= 0.56   Admitted yesterday for VAD work up. Swan placed. Initially given 40 mg IV lasix but after swan diuretics held and he was given IV fluids. Weight up 3 pounds. Denies SOB   Swan this am CVP 19 PAP 62/30 (43) PCWP 25 Thermo CO 3.7   CO-OX 60>46%  Creatinine 2.07>2.02   Objective:   Weight Range:  Vital Signs:   Temp:  [97.4 F (36.3 C)-98.6 F (37 C)] 98.6 F (37 C) (01/15 0720) Pulse Rate:  [68-85] 74 (01/15 0720) Resp:  [11-21] 21 (01/15 0720) BP: (84-119)/(53-91) 90/67 mmHg (01/15 0720) SpO2:  [90 %-100 %] 96 % (01/15 0720) Weight:  [73.4 kg (161 lb 13.1 oz)-74.7 kg (164 lb 10.9 oz)] 74.7 kg (164 lb 10.9 oz) (01/15 0434)    Weight change: Filed Weights   09/13/13 1300 09/14/13 0434   Weight: 73.4 kg (161 lb 13.1 oz) 74.7 kg (164 lb 10.9 oz)    Intake/Output:   Intake/Output Summary (Last 24 hours) at 09/14/13 0733 Last data filed at 09/14/13 0600  Gross per 24 hour  Intake 1351.34 ml  Output   1425 ml  Net -73.66 ml     Physical Exam: CVP 7 General: Fatigued appearing. No respiratory difficulty. Wife present  HEENT: normal  Neck: supple.Swan in [place]. Carotids 2+ bilat; no bruits. No lymphadenopathy or thryomegaly. RIJ swan appreciated.  Cor: PMI nondisplaced. Regular rate & rhythm. No rubs, gallops 3/6 murmur  Lungs: clear  Abdomen: soft, nontender, nondistended. No hepatosplenomegaly. No bruits or masses. Good bowel sounds.  Extremities: no cyanosis, clubbing, rash, R and LLE 1+ bilateral ted hose  Neuro: alert & oriented x 3, cranial nerves grossly intact. moves all 4 extremities w/o difficulty. Affect pleasant.   ECG: SR 70s  Labs: Basic Metabolic Panel:  Recent Labs Lab 09/11/13 1138 09/13/13 1349 09/14/13 0430  NA 142 141 141  K 3.2* 4.5 3.9  CL 103 104 104  CO2 25 22 24   GLUCOSE 109* 101* 92  BUN 34* 41* 40*  CREATININE 1.63* 2.07* 2.02*  CALCIUM 8.6 8.9 8.2*  MG  --  2.0 2.0    Liver Function Tests:  Recent Labs Lab 09/13/13 1349  AST 116*  ALT 109*  ALKPHOS 162*  BILITOT 0.9  PROT 6.9  ALBUMIN 3.0*   No results found for this basename: LIPASE, AMYLASE,  in the last 168 hours No results found for this basename: AMMONIA,  in the last 168 hours  CBC:  Recent Labs Lab 09/11/13 1138 09/13/13 1349  WBC 6.5 6.8  NEUTROABS  --  5.3  HGB 9.9* 9.0*  HCT 31.3* 29.0*  MCV 80.9 82.4  PLT 262 264    Cardiac Enzymes: No results found for this basename: CKTOTAL, CKMB, CKMBINDEX, TROPONINI,  in the last 168 hours  BNP: BNP (last 3 results)  Recent Labs  12/07/12 1200 09/13/13 1349  PROBNP 3310.0* 3883.0*     Other results:   Imaging: Ct Abdomen Pelvis Wo Contrast  09/13/2013   CLINICAL DATA:  73 year old with  history of lateral MI in 2009, occluded left circumflex and right coronary arteries, chronic systolic heart failure, presenting with increasing fatigue, dyspnea on exertion, functional decline and worsening renal functions. Patient being evaluated for potential left ventricular assist device.  EXAM: CT CHEST, ABDOMEN AND PELVIS WITHOUT CONTRAST  TECHNIQUE: Multidetector CT imaging of the chest, abdomen and pelvis was performed following the standard protocol without IV contrast. Oral contrast was administered.  COMPARISON:  No prior CT.  FINDINGS: CT CHEST FINDINGS  Heart enlarged with left ventricular enlargement. Pacing defibrillator lead tips in the right atrial appendage and at the right ventricular apex. Severe 3 vessel coronary atherosclerosis. No pericardial effusion. Minimal epicardial fat.  Mild calcified plaque involving the thoracic aorta without evidence of aneurysm. Numerous normal sized mediastinal lymph nodes; no significant lymphadenopathy in the chest.  Mild interstitial opacities and patchy airspace opacities throughout both lungs, consistent with edema, asymmetric and increased in the left upper lobe. Small bilateral pleural effusions, right greater than left. Central airways patent without significant bronchial wall thickening.  Bone window images demonstrate mild diffuse thoracic spondylosis.  CT ABDOMEN AND PELVIS FINDINGS  Diffuse increased attenuation throughout the liver is consistent with given history of Milrinone therapy. No focal parenchymal abnormality involving the liver. Normal unenhanced appearance of the spleen, pancreas, and adrenal glands. Small gallstones within the otherwise normal-appearing gallbladder. No biliary ductal dilation.  No evidence of hydronephrosis involving either kidney. Within the limits of the unenhanced technique, no focal parenchymal abnormality involving either kidney. Well preserved renal cortex. Very small (1-2 mm) a calculi in mid and lower pole calices  of the left kidney without obstruction. No right urinary tract calculi.  Moderate to severe aortoiliofemoral atherosclerosis without aneurysm. Normal size retroperitoneal and mesenteric lymph nodes; no significant lymphadenopathy.  Polypoid lesion involving the anterior wall of the distal esophagus just above the esophagogastric junction. Stomach decompressed and normal in appearance. Normal appearing small bowel. Diffuse colonic diverticulosis, most extensive in the descending and sigmoid colon, without evidence of acute diverticulitis. Normal appendix in the right upper pelvis. No ascites.  Urinary bladder unremarkable. Moderate prostate gland enlargement, particularly the median lobe, the gland measuring approximately 4.5 x 5.6 x 3.9 cm. Normal seminal vesicles.  Bone window images demonstrate facet degenerative changes throughout the lumbar spine, degenerative disc disease at every lumbar level, severe multifactorial spinal stenosis at L2-3 and moderate multifactorial spinal stenosis at L4-5.  IMPRESSION: CHEST:  1. Cardiomegaly with left ventricular enlargement. Severe 3 vessel coronary atherosclerosis. 2. Interstitial and airspace pulmonary edema and small bilateral pleural effusions consistent with mild CHF and/or fluid overload.  ABDOMEN/PELVIS:  1.  No acute abnormalities involving the abdomen or pelvis. 2. Polypoid lesion involving the anterior wall of the distal esophagus just above the esophagogastric junction. One might consider EGD in further evaluation to confirm this finding. 3. Cholelithiasis without evidence of acute cholecystitis. No biliary ductal dilation. 4. Diffuse increased hepatic attenuation consistent with the given history of Milrinone therapy. No focal hepatic parenchymal abnormality. 5. Non obstructing very small calculi in mid and lower pole calices of the left kidney. No right urinary tract calculi. 6. Diffuse colonic diverticulosis without evidence of acute diverticulitis. 7. Moderate  prostate gland enlargement, particularly the median lobe. 8. Degenerative changes throughout the lumbar spine with multifactorial spinal stenosis at L2-3 (severe) and L4-5 (moderate).   Electronically Signed   By: Evangeline Dakin M.D.   On: 09/13/2013 18:31   Ct Chest Wo Contrast  09/13/2013   CLINICAL DATA:  73 year old with history of lateral MI in 2009, occluded left circumflex and right coronary arteries, chronic systolic heart failure, presenting with increasing fatigue, dyspnea on exertion, functional decline and worsening renal functions. Patient being evaluated for potential left ventricular assist device.  EXAM: CT CHEST, ABDOMEN AND PELVIS WITHOUT CONTRAST  TECHNIQUE: Multidetector CT imaging of the chest, abdomen and pelvis was performed following the standard protocol without IV contrast. Oral contrast was administered.  COMPARISON:  No prior CT.  FINDINGS: CT CHEST FINDINGS  Heart enlarged with left ventricular enlargement. Pacing defibrillator lead tips in the right atrial appendage and at the right ventricular apex. Severe 3 vessel coronary atherosclerosis. No pericardial effusion. Minimal epicardial fat.  Mild calcified plaque involving the thoracic aorta without evidence of aneurysm. Numerous normal sized mediastinal lymph nodes; no significant lymphadenopathy in the chest.  Mild interstitial opacities and patchy airspace opacities throughout both lungs, consistent with edema, asymmetric and increased in the left upper lobe. Small bilateral pleural effusions, right greater than left. Central airways patent without significant bronchial wall thickening.  Bone window images demonstrate mild diffuse thoracic spondylosis.  CT ABDOMEN AND PELVIS FINDINGS  Diffuse increased attenuation throughout the liver is consistent with given history of Milrinone therapy. No focal parenchymal abnormality involving the liver. Normal unenhanced appearance of the spleen, pancreas, and adrenal glands. Small  gallstones within the otherwise normal-appearing gallbladder. No biliary ductal dilation.  No evidence of hydronephrosis involving either kidney. Within the limits of the unenhanced technique, no focal parenchymal abnormality involving either kidney. Well preserved renal cortex. Very small (1-2 mm) a calculi in mid and lower pole calices of the left kidney without obstruction. No right urinary tract calculi.  Moderate to severe aortoiliofemoral atherosclerosis without aneurysm. Normal size retroperitoneal and mesenteric lymph nodes; no significant lymphadenopathy.  Polypoid lesion involving the anterior wall of the distal esophagus just above the esophagogastric junction. Stomach decompressed and normal in appearance. Normal appearing small bowel. Diffuse colonic diverticulosis, most extensive in the descending and sigmoid colon, without evidence of acute diverticulitis. Normal appendix in the right upper pelvis. No ascites.  Urinary bladder unremarkable. Moderate prostate gland enlargement, particularly the median lobe, the gland measuring approximately 4.5 x 5.6 x 3.9 cm. Normal seminal vesicles.  Bone window images demonstrate facet degenerative changes throughout the lumbar spine, degenerative disc disease at every lumbar level, severe multifactorial spinal stenosis at L2-3 and moderate multifactorial spinal stenosis at L4-5.  IMPRESSION: CHEST:  1. Cardiomegaly with left ventricular enlargement. Severe 3 vessel coronary atherosclerosis. 2. Interstitial and airspace pulmonary edema and small bilateral pleural effusions consistent with mild CHF and/or fluid overload.  ABDOMEN/PELVIS:  1. No acute abnormalities involving the abdomen or pelvis. 2. Polypoid lesion involving the anterior wall of the distal esophagus just above the esophagogastric junction. One might consider EGD in further evaluation to confirm this finding. 3. Cholelithiasis without evidence of acute cholecystitis. No biliary ductal dilation. 4.  Diffuse increased hepatic attenuation consistent with the given history of Milrinone therapy. No focal hepatic parenchymal abnormality. 5. Non obstructing very small calculi in mid and lower pole calices of the left kidney. No right urinary tract calculi. 6. Diffuse colonic diverticulosis without evidence of acute diverticulitis. 7. Moderate prostate gland enlargement, particularly the median lobe. 8. Degenerative changes throughout the lumbar spine with multifactorial spinal stenosis at L2-3 (severe) and L4-5 (moderate).   Electronically Signed   By: Evangeline Dakin M.D.   On: 09/13/2013 18:31     Medications:     Scheduled Medications: . alteplase  4 mg Intracatheter Once  . carvedilol  3.125 mg Oral BID WC  . enoxaparin (LOVENOX) injection  30 mg Subcutaneous Q24H  . ezetimibe  10 mg Oral Daily  . hydrALAZINE  25 mg Oral Q8H  . sildenafil  20 mg Oral TID  . sodium chloride  3 mL Intravenous Q12H    Infusions: . sodium chloride 50 mL/hr at 09/13/13 1937  . milrinone 0.375 mcg/kg/min (09/13/13 2007)    PRN Medications: sodium chloride, acetaminophen, ondansetron (ZOFRAN) IV, sodium chloride, zolpidem   Assessment:   1. A/C Systolic Heart Failure ECHO EF 20-25% INTERMACS 2 on transplant list [place]  2. Hypothyroid  3.CAD s/p stent to LCX 2004 with re-occlusion of LCX with lateral MI 2009 and repeat stenting with a Promus, total occlusion of RCA  4. S/P ICD 2009  5. Hyperlipidemia  6. Arthritis S/P TKR 2013  7. Blood Type O negative  8. PVCs    Plan/Discussion:    Hydrated overnight. Now volume status up and co-ox appears low (we are repeating). Renal function minimally improved. Will stop IVF and diurese gently today. Will likely need 2nd inotrope (dobutamine or levophed) to augment cardiac output. IABP still an option if numbers not improving.  If we can optimize, will plan VAD early next week.   SW and Palliative Care consult pending for VAD work up.   The patient is  critically ill with multiple organ systems failure and requires high complexity decision making for assessment and support, frequent evaluation and titration of therapies, application of advanced monitoring technologies and extensive interpretation of multiple databases.   Critical Care Time devoted to patient care services described in this note is 35 Minutes.   Length of Stay: 1  Genevive Bi 09/14/2013, 7:33 AM Advanced Heart Failure Team Pager [phone] (M-F; 7a - 4p)  Please contact [facility] for night-coverage after hours (4p -7a ) and weekends on [email or web address]

## 2013-09-14 NOTE — Progress Notes (Signed)
*  PRELIMINARY RESULTS* Vascular Ultrasound Lower extremity venous duplex has been completed.  Preliminary findings: no evidence of DVT.  Landry Mellow, RDMS, RVT  09/14/2013, 3:04 PM

## 2013-09-14 NOTE — Consult Note (Signed)
[address]       [place], [address]             [phone]        Bryar C Gaillard Reinbeck Medical Record [id number] Date of Birth: [birth year]  Referring: No ref. provider found Primary Care: Irven Shelling, MD  Chief Complaint:   Shortness of breath weakness, advanced heart failure  Patient examined, most recent 2-D echocardiogram and right heart cath data and previous coronary angiogram reviewed.  History of Present Illness:     73 year old Caucasian male with ischemic cardiomyopathy EF 20-25% supported on home milrinone since April 2014 admitted yesterday with acute on chronic heart failure, low output and rising creatinine. He showed a deterioration overnight and a balloon pump was placed today with improved hemodynamics and improved symptoms.  The patient has a pacemaker-ICD place 2009 which has never discharged  The patient has had previous significant coronary disease and percutaneous interventions-stent to the circumflex twice. His most recent coronary angiogram shows total chronic occlusion of the RCA, patent PCI of the circumflex and a heavily calcified but patent LAD.  Echocardiogram shows EF 20-25% with moderate to severe MR, moderate TR, mild RV dysfunction, no LV thrombus, sinus rhythm  Sonia Side 15 right heart cath data shows CVP 9 wedge 16 cardiac index 1.9 PA pressures 55/25  Current Activity/ Functional Status: Extremely limited with any exertional activity   Zubrod Score: At the time of surgery this patient's most appropriate activity status/level should be described as: []  Normal activity, no symptoms []  Symptoms, fully ambulatory []  Symptoms, in bed less than or equal to 50% of the time []  Symptoms, in bed greater than 50% of the time but less than 100% [x]  Bedridden on aortic balloon pump []  Moribund  Past Medical History  Diagnosis Date  . Ischemic cardiomyopathy     a. 10/09 Echo: Sev LV Dysfxn, inf/lat AK, Mod MR.  [name]  Hypercholesteremia   . Osteoarthritis     a. s/p R TKR  . Anxiety   . Hypothyroidism   . CAD (coronary artery disease)     S/P stenting of LCX in 2004;  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 Hypertension   . PVC (premature ventricular contraction)   . Pacemaker   . CHF (congestive heart failure)   . Shortness of breath   . Automatic implantable cardioverter-defibrillator in situ     Past Surgical History  Procedure Laterality Date  . Defibrillator  2009  . Total knee arthroplasty  11/27/11    left  . Insert / replace / remove pacemaker  2009  . Coronary angioplasty with stent placement  2004  . Coronary angioplasty with stent placement  07/2008  . Knee arthroscopy w/ partial medial meniscectomy  12/2002    left  . Knee arthroscopy      bilaterally  . Total knee arthroplasty  11/27/2011    Procedure: TOTAL KNEE ARTHROPLASTY;  Surgeon: Yvette Rack., MD;  Location: [facility];  Service: Orthopedics;  Laterality: Left;  left total knee arthroplasty    History  Smoking status  . Never Smoker   Smokeless tobacco  . Never Used    History  Alcohol  Use  . Yes    Comment: "occassionlly; last beer was 09/2011"    History   Social History  . Marital Status: Married    Spouse Name: N/A    Number of Children: 1  . Years of Education: N/A   Occupational History  . referee   . cleaning company    Social History Main Topics  . Smoking status: Never Smoker   . Smokeless tobacco: Never Used  . Alcohol Use: Yes     Comment: "occassionlly; last beer was 09/2011"  . Drug Use: No     Comment: "back in our younger days we smoked a little pot"  . Sexual Activity: Not Currently   Other Topics Concern  . Not on file   Social History Narrative  . No narrative on file    Allergies    Allergen Reactions  . Diovan [Valsartan] Other (See Comments)    Hypotension at low dose, hyperkalemia  . Lipitor [Atorvastatin Calcium] Other (See Comments)    Muscle pain    Current Facility-Administered Medications  Medication Dose Route Frequency Provider Last Rate Last Dose  . 0.9 %  sodium chloride infusion  250 mL Intravenous PRN Amy D Clegg, NP 10 mL/hr at 09/14/13 1600 250 mL at 09/14/13 1600  . 0.9 %  sodium chloride infusion  250 mL Intravenous PRN Jolaine Artist, MD      . 0.9 %  sodium chloride infusion   Intravenous Continuous Jolaine Artist, MD 1.6 mL/hr at 09/14/13 1600    . acetaminophen (TYLENOL) tablet 650 mg  650 mg Oral Q4H PRN Jolaine Artist, MD      . alteplase (ACTIVASE) injection 4 mg  4 mg Intracatheter Once Amy D Clegg, NP      . ezetimibe (ZETIA) tablet 10 mg  10 mg Oral Daily Amy D Clegg, NP   10 mg at 09/14/13 1432  . furosemide (LASIX) injection 40 mg  40 mg Intravenous Q12H Jolaine Artist, MD      . heparin ADULT infusion 100 units/mL (25000 units/250 mL)  1,100 Units/hr Intravenous Continuous Jolaine Artist, MD 11 mL/hr at 09/14/13 1600 1,100 Units/hr at 09/14/13 1600  . hydrALAZINE (APRESOLINE) tablet 25 mg  25 mg Oral Q8H Amy D Clegg, NP   25 mg at 09/14/13 1432  . levothyroxine (SYNTHROID, LEVOTHROID) tablet 150 mcg  150 mcg Oral Custom Jolaine Artist, MD       And  . levothyroxine (SYNTHROID, LEVOTHROID) tablet 75 mcg  75 mcg Oral Custom Shaune Pascal Bensimhon, MD      . milrinone [facility]) infusion 200 mcg/mL (0.2 mg/ml)  0.375 mcg/kg/min Intravenous Continuous Amy D Clegg, NP 8.4 mL/hr at 09/14/13 1600 0.377 mcg/kg/min at 09/14/13 1600  . ondansetron (ZOFRAN) injection 4 mg  4 mg Intravenous Q6H PRN Jolaine Artist, MD      . sildenafil (REVATIO) tablet 20 mg  20 mg Oral TID Jolaine Artist, MD   20 mg at 09/14/13 1433  . sodium chloride 0.9 % injection 3 mL  3 mL Intravenous Q12H Amy D Clegg, NP   10 mL at 09/13/13 1300  .  sodium chloride 0.9 % injection 3 mL  3 mL Intravenous PRN Amy D Clegg, NP      . sodium chloride 0.9 % injection 3 mL  3 mL Intravenous Q12H Shaune Pascal Bensimhon, MD      . sodium chloride 0.9 % injection 3 mL  3 mL Intravenous PRN  Jolaine Artist, MD      . zolpidem [facility]) tablet 5 mg  5 mg Oral QHS PRN Lyda Jester, PA-C   5 mg at 09/13/13 2229    Prescriptions prior to admission  Medication Sig Dispense Refill  . amiodarone (PACERONE) 200 MG tablet Take 1 tablet (200 mg total) by mouth 2 (two) times daily.  60 tablet  6  . aspirin 325 MG tablet Take 325 mg by mouth daily.        . carvedilol (COREG) 3.125 MG tablet Take 1 tablet (3.125 mg total) by mouth 2 (two) times daily with a meal.  60 tablet  6  . Coenzyme Q10 (CO Q-10 PO) Take 1 capsule by mouth 2 (two) times daily.       [name] ezetimibe (ZETIA) 10 MG tablet Take 10 mg by mouth daily.      . furosemide (LASIX) 20 MG tablet Take 20 mg by mouth daily as needed (for 3 lbs or more weight gain per day or 5 lbs or more weight gain per week or as instructed by Dr. Tempie Hoist.).      [name] hydrALAZINE (APRESOLINE) 25 MG tablet Take 1 tablet (25 mg total) by mouth 3 (three) times daily.  90 tablet  3  . isosorbide mononitrate (IMDUR) 30 MG 24 hr tablet Take 0.5 tablets (15 mg total) by mouth daily.  45 tablet  3  . levothyroxine (SYNTHROID, LEVOTHROID) 150 MCG tablet 75-150 mcg daily before breakfast. Take 1 tablet (150 mcg) daily on an empty stomach except take 1/2 tablet (75 mcg) every Friday as directed.      . milrinone (PRIMACOR) 20 MG/100ML SOLN infusion Inject 0.375 mcg/kg/min into the vein continuous.      . Omega-3 300 MG CAPS Take 300 mg by mouth daily.       . potassium chloride SA (K-DUR,KLOR-CON) 20 MEQ tablet Take 1 tablet (20 mEq total) by mouth as needed. Take only when you take Furosemide  30 tablet  3  . rosuvastatin (CRESTOR) 20 MG tablet Take 20 mg by mouth daily.      . nitroGLYCERIN (NITROSTAT) 0.4 MG SL tablet Place 0.4 mg  under the tongue every 5 (five) minutes as needed. For chest pain        Family History  Problem Relation Age of Onset  . Heart failure Father   . Stroke Father   . Coronary artery disease Mother   . Heart disease Mother   . Hyperlipidemia Sister      Review of Systems:     Cardiac Review of Systems: Y or N  Chest Pain [ no   ]  Resting SOB [ yes  ] Exertional SOB  [place].Blacker  ]  Orthopnea [place].Blacker  ]   Pedal Edema [no   ]    Palpitations [ no ] Syncope  [no  ]   Presyncope [ no  ]  General Review of Systems: [Y] = yes [  ]=no Constitional: recent weight change [no  ]; anorexia [  ]; fatigue [place].Blacker  ]; nausea [  ]; night sweats [no  ]; fever [  ]; or chills [  ]  Dental: poor dentition[  ]; Last Dentist visit: 1 year  Eye : blurred vision [  ]; diplopia [   ]; vision changes [  ];  Amaurosis fugax[  ]; Resp: cough [  ];  wheezing[  ];  hemoptysis[  ]; shortness of breath[  ]; paroxysmal nocturnal dyspnea[  ]; dyspnea on exertion[  ]; or orthopnea[  ];  GI:  gallstones[yes asymptomatic-on CT scan  ], vomiting[  ];  dysphagia[  ]; melena[  ];  hematochezia [  ]; heartburn[  ];   Hx of  Colonoscopy[  ]; GU: kidney stones [ yes-and CT scan ]; hematuria[  ];   dysuria [  ];  nocturia[  ];  history of     obstruction [  ]; urinary frequency [  ]             Skin: rash, swelling[  ];, hair loss[  ];  peripheral edema[  ];  or itching[  ]; Musculosketetal: myalgias[  ];  joint swelling[  ];  joint erythema[  ];  joint pain[  ];  back pain[  ];  Heme/Lymph: bruising[  ];  bleeding[  ];  anemia[  ];  Neuro: TIA[  ];  headaches[  ];  stroke[  ];  vertigo[  ];  seizures[  ];   paresthesias[  ];  difficulty walking[  ];  Psych:depression[  ]; anxiety[  ];  Endocrine: diabetes[no  ];  thyroid dysfunction[  ];  Immunizations: Flu [  ]; Pneumococcal[  ];  Other:  Physical Exam: BP 126/83  Pulse 72  Temp(Src) 98.1 F (36.7 C) (Core (Comment))   Resp 19  Ht 5\' 7"  (1.702 m)  Wt 164 lb 10.9 oz (74.7 kg)  BMI 25.79 kg/m2  SpO2 98%  General appearance-63-year-old gentleman in the CCU no acute distress accompanied by wife HEENT-normocephalic pupils equal dentition good Neck-no significant JVD no bruit no adenopathy Chest-no deformity or tenderness, breath sounds clear Cardiac-positive S3 gallop, 3/6 holo- systolic murmur of MR Abdomen-soft nontender without pulsatile mass Extremities-no clubbing cyanosis tenderness Neurologic-no focal motor deficit alert and appropriate Vascular-palpable pulses in the extremities, balloon pump and right femoral artery  Diagnostic Studies & Laboratory data:     Recent Radiology Findings:   Ct Abdomen Pelvis Wo Contrast  09/13/2013   CLINICAL DATA:  73 year old with history of lateral MI in 2009, occluded left circumflex and right coronary arteries, chronic systolic heart failure, presenting with increasing fatigue, dyspnea on exertion, functional decline and worsening renal functions. Patient being evaluated for potential left ventricular assist device.  EXAM: CT CHEST, ABDOMEN AND PELVIS WITHOUT CONTRAST  TECHNIQUE: Multidetector CT imaging of the chest, abdomen and pelvis was performed following the standard protocol without IV contrast. Oral contrast was administered.  COMPARISON:  No prior CT.  FINDINGS: CT CHEST FINDINGS  Heart enlarged with left ventricular enlargement. Pacing defibrillator lead tips in the right atrial appendage and at the right ventricular apex. Severe 3 vessel coronary atherosclerosis. No pericardial effusion. Minimal epicardial fat.  Mild calcified plaque involving the thoracic aorta without evidence of aneurysm. Numerous normal sized mediastinal lymph nodes; no significant lymphadenopathy in the chest.  Mild interstitial opacities and patchy airspace opacities throughout both lungs, consistent with edema, asymmetric and increased in the left upper lobe. Small bilateral pleural  effusions, right greater than left. Central airways patent without significant bronchial wall thickening.  Bone window images demonstrate mild diffuse thoracic spondylosis.  CT ABDOMEN AND PELVIS FINDINGS  Diffuse increased attenuation  throughout the liver is consistent with given history of Milrinone therapy. No focal parenchymal abnormality involving the liver. Normal unenhanced appearance of the spleen, pancreas, and adrenal glands. Small gallstones within the otherwise normal-appearing gallbladder. No biliary ductal dilation.  No evidence of hydronephrosis involving either kidney. Within the limits of the unenhanced technique, no focal parenchymal abnormality involving either kidney. Well preserved renal cortex. Very small (1-2 mm) a calculi in mid and lower pole calices of the left kidney without obstruction. No right urinary tract calculi.  Moderate to severe aortoiliofemoral atherosclerosis without aneurysm. Normal size retroperitoneal and mesenteric lymph nodes; no significant lymphadenopathy.  Polypoid lesion involving the anterior wall of the distal esophagus just above the esophagogastric junction. Stomach decompressed and normal in appearance. Normal appearing small bowel. Diffuse colonic diverticulosis, most extensive in the descending and sigmoid colon, without evidence of acute diverticulitis. Normal appendix in the right upper pelvis. No ascites.  Urinary bladder unremarkable. Moderate prostate gland enlargement, particularly the median lobe, the gland measuring approximately 4.5 x 5.6 x 3.9 cm. Normal seminal vesicles.  Bone window images demonstrate facet degenerative changes throughout the lumbar spine, degenerative disc disease at every lumbar level, severe multifactorial spinal stenosis at L2-3 and moderate multifactorial spinal stenosis at L4-5.  IMPRESSION: CHEST:  1. Cardiomegaly with left ventricular enlargement. Severe 3 vessel coronary atherosclerosis. 2. Interstitial and airspace  pulmonary edema and small bilateral pleural effusions consistent with mild CHF and/or fluid overload.  ABDOMEN/PELVIS:  1. No acute abnormalities involving the abdomen or pelvis. 2. Polypoid lesion involving the anterior wall of the distal esophagus just above the esophagogastric junction. One might consider EGD in further evaluation to confirm this finding. 3. Cholelithiasis without evidence of acute cholecystitis. No biliary ductal dilation. 4. Diffuse increased hepatic attenuation consistent with the given history of Milrinone therapy. No focal hepatic parenchymal abnormality. 5. Non obstructing very small calculi in mid and lower pole calices of the left kidney. No right urinary tract calculi. 6. Diffuse colonic diverticulosis without evidence of acute diverticulitis. 7. Moderate prostate gland enlargement, particularly the median lobe. 8. Degenerative changes throughout the lumbar spine with multifactorial spinal stenosis at L2-3 (severe) and L4-5 (moderate).   Electronically Signed   By: Evangeline Dakin M.D.   On: 09/13/2013 18:31   Ct Chest Wo Contrast  09/13/2013   CLINICAL DATA:  73 year old with history of lateral MI in 2009, occluded left circumflex and right coronary arteries, chronic systolic heart failure, presenting with increasing fatigue, dyspnea on exertion, functional decline and worsening renal functions. Patient being evaluated for potential left ventricular assist device.  EXAM: CT CHEST, ABDOMEN AND PELVIS WITHOUT CONTRAST  TECHNIQUE: Multidetector CT imaging of the chest, abdomen and pelvis was performed following the standard protocol without IV contrast. Oral contrast was administered.  COMPARISON:  No prior CT.  FINDINGS: CT CHEST FINDINGS  Heart enlarged with left ventricular enlargement. Pacing defibrillator lead tips in the right atrial appendage and at the right ventricular apex. Severe 3 vessel coronary atherosclerosis. No pericardial effusion. Minimal epicardial fat.  Mild  calcified plaque involving the thoracic aorta without evidence of aneurysm. Numerous normal sized mediastinal lymph nodes; no significant lymphadenopathy in the chest.  Mild interstitial opacities and patchy airspace opacities throughout both lungs, consistent with edema, asymmetric and increased in the left upper lobe. Small bilateral pleural effusions, right greater than left. Central airways patent without significant bronchial wall thickening.  Bone window images demonstrate mild diffuse thoracic spondylosis.  CT ABDOMEN AND PELVIS FINDINGS  Diffuse increased  attenuation throughout the liver is consistent with given history of Milrinone therapy. No focal parenchymal abnormality involving the liver. Normal unenhanced appearance of the spleen, pancreas, and adrenal glands. Small gallstones within the otherwise normal-appearing gallbladder. No biliary ductal dilation.  No evidence of hydronephrosis involving either kidney. Within the limits of the unenhanced technique, no focal parenchymal abnormality involving either kidney. Well preserved renal cortex. Very small (1-2 mm) a calculi in mid and lower pole calices of the left kidney without obstruction. No right urinary tract calculi.  Moderate to severe aortoiliofemoral atherosclerosis without aneurysm. Normal size retroperitoneal and mesenteric lymph nodes; no significant lymphadenopathy.  Polypoid lesion involving the anterior wall of the distal esophagus just above the esophagogastric junction. Stomach decompressed and normal in appearance. Normal appearing small bowel. Diffuse colonic diverticulosis, most extensive in the descending and sigmoid colon, without evidence of acute diverticulitis. Normal appendix in the right upper pelvis. No ascites.  Urinary bladder unremarkable. Moderate prostate gland enlargement, particularly the median lobe, the gland measuring approximately 4.5 x 5.6 x 3.9 cm. Normal seminal vesicles.  Bone window images demonstrate facet  degenerative changes throughout the lumbar spine, degenerative disc disease at every lumbar level, severe multifactorial spinal stenosis at L2-3 and moderate multifactorial spinal stenosis at L4-5.  IMPRESSION: CHEST:  1. Cardiomegaly with left ventricular enlargement. Severe 3 vessel coronary atherosclerosis. 2. Interstitial and airspace pulmonary edema and small bilateral pleural effusions consistent with mild CHF and/or fluid overload.  ABDOMEN/PELVIS:  1. No acute abnormalities involving the abdomen or pelvis. 2. Polypoid lesion involving the anterior wall of the distal esophagus just above the esophagogastric junction. One might consider EGD in further evaluation to confirm this finding. 3. Cholelithiasis without evidence of acute cholecystitis. No biliary ductal dilation. 4. Diffuse increased hepatic attenuation consistent with the given history of Milrinone therapy. No focal hepatic parenchymal abnormality. 5. Non obstructing very small calculi in mid and lower pole calices of the left kidney. No right urinary tract calculi. 6. Diffuse colonic diverticulosis without evidence of acute diverticulitis. 7. Moderate prostate gland enlargement, particularly the median lobe. 8. Degenerative changes throughout the lumbar spine with multifactorial spinal stenosis at L2-3 (severe) and L4-5 (moderate).   Electronically Signed   By: Evangeline Dakin M.D.   On: 09/13/2013 18:31   Dg Chest Port 1 View  09/14/2013   CLINICAL DATA:  Balloon pump placement  EXAM: PORTABLE CHEST - 1 VIEW  COMPARISON:  CT CHEST W/O CM dated 09/13/2013  FINDINGS: There is a Swan-Ganz catheter with the tip within the right lower lobe pulmonary artery segment. There is a 3 lead cardiac pacer. There is interval placement of an intra-aortic balloon pump with the metallic tip at the distal aspect of the aortic arch. The lungs are clear. No focal consolidation, pleural effusion or pneumothorax. Mild cardiomegaly. Unremarkable osseous structures.   IMPRESSION: 1. Intra-aortic balloon pump with the metallic tip at the distal aspect of the aortic arch. 2. Swan-Ganz catheter with the tip within the right lower lobe pulmonary artery segment.   Electronically Signed   By: Kathreen Devoid   On: 09/14/2013 15:08      Recent Lab Findings: Lab Results  Component Value Date   WBC 6.8 09/13/2013   HGB 9.0* 09/13/2013   HCT 29.0* 09/13/2013   PLT 264 09/13/2013   GLUCOSE 91 09/14/2013   CHOL 59 09/14/2013   TRIG 65 09/14/2013   HDL 24* 09/14/2013   LDLCALC 22 09/14/2013   ALT 109* 09/13/2013   AST 116*  09/13/2013   NA 141 09/14/2013   K 3.9 09/14/2013   CL 102 09/14/2013   CREATININE 1.83* 09/14/2013   BUN 35* 09/14/2013   CO2 25 09/14/2013   TSH 12.878* 09/13/2013   INR 1.29 09/13/2013   HGBA1C 5.6 09/14/2013      Assessment / Plan:     Ischemic cardiomyopathy with acute on chronic heart failure, failing maximal medical therapy including home inotropes now balloon pump dependent  Acute renal insufficiency with creatinine greater than 2.0  Moderate tricuspid regurgitation, mild RV dysfunction  Patient is being prepared for LVAD implantation performed as a bridge to transplantation as patient is on active waiting list at [facility] in [place]  for cardiac transplantation. The patient understands that the LVAD is being implanted to support the patient to provide the potential for  later transplantation. I discussed the details of the operation including the use of general anesthesia and cardio point bypass, the location of the surgical incision, and the possibility that he will need an addition a tricuspid valve repair for tricuspid regurgitation. He understands the likelihood of blood transfusion requirement, the expected postoperative hospital recovery, and the potential risks of stroke, renal failure, infection, and death. He agrees to proceed with surgery which hopefully will take place in the next few days after renal function  improves.       @ME1 @ 09/14/2013 5:46 PM

## 2013-09-14 NOTE — Consult Note (Signed)
Patient Johnny Oneill      DOB: [birth year]      ND:[id number]     Consult Note from the Palliative Medicine Team at [facility] Requested by: Dr Mahalia Longest     PCP: Irven Shelling, MD Reason for Consultation: Discuss Preparedness Plan anticipating LVAD Placement     Phone Number:[phone]  Assessment of patients Current state: Johnny Oneill is a 73 year old male with history of CAD s/p stent to LCX 2004 with re-occlusion of LCX with lateral MI 2009 and repeat stenting, total occlusion of RCA, 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 S/P L TKR 2013. Last Cath 2009.  He has been followed closely in the HF clinic as well as [place]. On 08/01/13 he was evaluated at [facility] and had decompensated HF. At that time Milrinone was increased to 0.375 mcg. He was evaluated in the HF clinic 12/23 for increased fatigue and later had a RHC on 12/26 which showed increased PVR and elevated filling pressure. He returned to the HF clinic 09/11/13 for an acute visit due to increased fatigue and functional decline.   Admitted for stabilization and work-up for LVA placement   This NP Wadie Lessen reviewed medical records, received report from team, assessed the patient and then meet at the bedside with  His wife Johnny Oneill to  discuss advanced directives and a preparedness plan in light of scheduled LVAD implantation early next.    A detailed discussion was had today regarding the concept of a preparedness plan as it relates to LVAD therapy.   Patient and family were comfortable talking about the "what ifs and the importance of today's conversation so everyone can have all the information to be full participants and to understand the patient's basic beliefs and wishes as it relates  to healthcare and quality of life.  Concepts specific to future possibilities of -long term ventilation -artificial feeding and  hydration -long term antibiotic use -dialysis -psychological adjustments -need to terminate the pump- Other chronic or terminal disease unrelated to cardiac LVAD  Patient was able to verbalize to his family the importance of quality of life.  He is hopeful  That LVAD procedure will increase his quality if life.  He understands the risks and benefits o fthe procedure as it relates to his future.  At this time patient is open to all available medical interventions to prolong life and the success of the LVAD therapy. He shared and verbalized an understanding that he and his family would know when the burdens of treatment would outweigh the benefits and trust in his family's support at that time.  Patient and family were encouraged to continue conversation into the future as it is vital for the patient centered care.    Goals of Care:  1.  Patient is open to all available medical interventions for the success of the LVAD placement.    2.  Weakness/Fatigue: continued medical management of chronic disease     Brief HPI: Johnny Oneill is a 73 year old male with history of CAD s/p stent to LCX 2004 with re-occlusion of LCX with lateral MI 2009 and repeat stenting, total occlusion of RCA, 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 S/P L TKR 2013. Last Cath 2009.  He has been followed closely in the HF clinic as well as [place]. On 08/01/13  he was evaluated at [facility] and had decompensated HF. At that time Milrinone was increased to 0.375 mcg. He was evaluated in the HF clinic 12/23 for increased fatigue and later had a RHC on 12/26 which showed increased PVR and elevated filling pressure. He returned to the HF clinic 09/11/13 for an acute visit due to increased fatigue and functional decline.    ROS: weakness, fatigue, dyspnea    PMH:  Past Medical History  Diagnosis Date  . Ischemic cardiomyopathy     a. 10/09  Echo: Sev LV Dysfxn, inf/lat AK, Mod Johnny.  [name];  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 Hypertension   . PVC (premature ventricular contraction)   . Pacemaker   . CHF (congestive heart failure)   . Shortness of breath   . Automatic implantable cardioverter-defibrillator in situ      PSH: Past Surgical History  Procedure Laterality Date  . Defibrillator  2009  . Total knee arthroplasty  11/27/11    left  . Insert / replace / remove pacemaker  2009  . Coronary angioplasty with stent placement  2004  . Coronary angioplasty with stent placement  07/2008  . Knee arthroscopy w/ partial medial meniscectomy  12/2002    left  . Knee arthroscopy      bilaterally  . Total knee arthroplasty  11/27/2011    Procedure: TOTAL KNEE ARTHROPLASTY;  Surgeon: Yvette Rack., MD;  Location: [place];  Service: Orthopedics;  Laterality: Left;  left total knee arthroplasty   I have reviewed the FH and SH and  If appropriate update it with new information. Allergies  Allergen Reactions  . Diovan [Valsartan] Other (See Comments)    Hypotension at low dose, hyperkalemia  . Lipitor [Atorvastatin Calcium] Other (See Comments)    Muscle pain   Scheduled Meds: . alteplase  4 mg Intracatheter Once  . enoxaparin (LOVENOX) injection  30 mg Subcutaneous Q24H  . ezetimibe  10 mg Oral Daily  . hydrALAZINE  25 mg Oral Q8H  . sildenafil  20 mg Oral TID  . sodium chloride  3 mL Intravenous Q12H   Continuous Infusions: . milrinone 0.375 mcg/kg/min (09/14/13 0907)   PRN Meds:.sodium chloride, acetaminophen, ondansetron (ZOFRAN) IV, sodium chloride, zolpidem    BP  111/69  Pulse 77  Temp(Src) 98.6 F (37 C) (Oral)  Resp 21  Ht 5\' 7"  (1.702 m)  Wt 74.7 kg (164 lb 10.9 oz)  BMI 25.79 kg/m2  SpO2 96%   PPS:30 %   Intake/Output Summary (Last 24 hours) at 09/14/13 [id number] Last data filed at 09/14/13 0800  Gross per 24 hour  Intake 1351.34 ml  Output   1700 ml  Net -348.66 ml    Physical Exam:  General: chronically ill appearing, NAD HEENT:  Mm, no exudate Chest:   CTA CVS: RRR, noted murmur Abdomen: soft NT +BS Ext:  Without edema Neuro: alert and oriented X3 fully engaged in conversation  Labs: CBC    Component Value Date/Time   WBC  6.8 09/13/2013 1349   RBC 3.52* 09/13/2013 1349   HGB 9.0* 09/13/2013 1349   HCT 29.0* 09/13/2013 1349   PLT 264 09/13/2013 1349   MCV 82.4 09/13/2013 1349   MCH 25.6* 09/13/2013 1349   MCHC 31.0 09/13/2013 1349   RDW 18.2* 09/13/2013 1349   LYMPHSABS 0.8 09/13/2013 1349   MONOABS 0.5 09/13/2013 1349   EOSABS 0.1 09/13/2013 1349   BASOSABS 0.0 09/13/2013 1349    BMET    Component Value Date/Time   NA 141 09/14/2013 0430   K 3.9 09/14/2013 0430   CL 104 09/14/2013 0430   CO2 24 09/14/2013 0430   GLUCOSE 92 09/14/2013 0430   BUN 40* 09/14/2013 0430   BUN 21 05/19/2010   CREATININE 2.02* 09/14/2013 0430   CALCIUM 8.2* 09/14/2013 0430   GFRNONAA 32* 09/14/2013 0430   GFRAA 37* 09/14/2013 0430    CMP     Component Value Date/Time   NA 141 09/14/2013 0430   K 3.9 09/14/2013 0430   CL 104 09/14/2013 0430   CO2 24 09/14/2013 0430   GLUCOSE 92 09/14/2013 0430   BUN 40* 09/14/2013 0430   BUN 21 05/19/2010   CREATININE 2.02* 09/14/2013 0430   CALCIUM 8.2* 09/14/2013 0430   PROT 6.9 09/13/2013 1349   ALBUMIN 3.0* 09/13/2013 1349   AST 116* 09/13/2013 1349   ALT 109* 09/13/2013 1349   ALKPHOS 162* 09/13/2013 1349   BILITOT 0.9 09/13/2013 1349   GFRNONAA 32* 09/14/2013 0430   GFRAA 37* 09/14/2013 0430    Time In Time Out Total Time Spent with Patient Total Overall Time  1500 1620 75 min 80 min    Greater than 50%  of  this time was spent counseling and coordinating care related to the above assessment and plan.  Wadie Lessen NP  Palliative Medicine Team Team Phone # [phone] Pager [phone]

## 2013-09-14 NOTE — Clinical Documentation Improvement (Signed)
In the H&P from 09/13/13, Dr. Haroldine Laws documented "Also his renal function has been getting worse."  Possible Clinical Conditions?        Acute on Chronic Renal Failure      Chronic Renal Failure      Cannot Clinically Determine   Also, please specify the Stage of Chronic Kidney Disease.  _______CKD Stage I - GFR > OR = 90 _______CKD Stage II - GFR 60-80 _______CKD Stage III - GFR 30-59 _______CKD Stage IV - GFR 15-29 _______CKD Stage V - GFR < 15 _______Cannot Clinically determine   Labs: Creatinine  09/13/13 2.07 mg/dL Creatinine 08/25/13 1.38 mg/dL Creatinine 12/09/12 1.50 mg/dL Creatinine 12/30/11  1.20 mg/dL  GFR  09/13/13 31 mL/min GFR  08/25/13 50 mL/min GFR  12/09/12 46 mL/min GFR  12/30/11  66 mL/min  Thank You,  Posey Pronto, RN, BSN, [place] Documentation Improvement Specialist HIM department--[place] Office [phone]

## 2013-09-14 NOTE — H&P (View-Only) (Signed)
Advanced Heart Failure Rounding Note   Subjective:    [name] is a 73 year old male with history of CAD s/p stent to LCX 2004 with re-occlusion of LCX with lateral MI 2009 and repeat stenting, total occlusion of RCA, 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 S/P L TKR 2013. Last Cath 2009.  He has been followed closely in the HF clinic as well as [place]. On 08/01/13 he was evaluated at [facility] and had decompensated HF. At that time Milrinone was increased to 0.375 mcg. He was evaluated in the HF clinic 12/23 for increased fatigue and later had a RHC on 12/26 which showed increased PVR and elevated filling pressure. Revatio was recommended at time. He returned to the HF clinic 09/11/13 for an acute visit due to increased fatigue and functional decline. He was only able to walk a few yards. Also his renal function has been getting worse. Weight at home 160-162 pounds.   1/15 Swan placement. Done on milrinone 0.375 mcg/kg/min  RA = 9  RV = 53/4/10  PA = 55/24 (34)  PCW = 16  Fick cardiac output/index = 6.1/3.3  Thermo CO/CI = 4.3/2.4  PVR = 4.2 [place]  FA sat = 92%  PA sat = 58%, 60% RA/PCWP= 0.56   Admitted yesterday for VAD work up. Swan placed. Initially given 40 mg IV lasix but after swan diuretics held and he was given IV fluids. Weight up 3 pounds. Denies SOB   Swan this am CVP 19 PAP 62/30 (43) PCWP 25 Thermo CO 3.7   CO-OX 60>46%  Creatinine 2.07>2.02   Objective:   Weight Range:  Vital Signs:   Temp:  [97.4 F (36.3 C)-98.6 F (37 C)] 98.6 F (37 C) (01/15 0720) Pulse Rate:  [68-85] 74 (01/15 0720) Resp:  [11-21] 21 (01/15 0720) BP: (84-119)/(53-91) 90/67 mmHg (01/15 0720) SpO2:  [90 %-100 %] 96 % (01/15 0720) Weight:  [73.4 kg (161 lb 13.1 oz)-74.7 kg (164 lb 10.9 oz)] 74.7 kg (164 lb 10.9 oz) (01/15 0434)    Weight change: Filed Weights   09/13/13 1300 09/14/13 0434   Weight: 73.4 kg (161 lb 13.1 oz) 74.7 kg (164 lb 10.9 oz)    Intake/Output:   Intake/Output Summary (Last 24 hours) at 09/14/13 0733 Last data filed at 09/14/13 0600  Gross per 24 hour  Intake 1351.34 ml  Output   1425 ml  Net -73.66 ml     Physical Exam: CVP 7 General: Fatigued appearing. No respiratory difficulty. Wife present  HEENT: normal  Neck: supple.Swan in [place]. Carotids 2+ bilat; no bruits. No lymphadenopathy or thryomegaly. RIJ swan appreciated.  Cor: PMI nondisplaced. Regular rate & rhythm. No rubs, gallops 3/6 murmur  Lungs: clear  Abdomen: soft, nontender, nondistended. No hepatosplenomegaly. No bruits or masses. Good bowel sounds.  Extremities: no cyanosis, clubbing, rash, R and LLE 1+ bilateral ted hose  Neuro: alert & oriented x 3, cranial nerves grossly intact. moves all 4 extremities w/o difficulty. Affect pleasant.   ECG: SR 70s  Labs: Basic Metabolic Panel:  Recent Labs Lab 09/11/13 1138 09/13/13 1349 09/14/13 0430  NA 142 141 141  K 3.2* 4.5 3.9  CL 103 104 104  CO2 25 22 24   GLUCOSE 109* 101* 92  BUN 34* 41* 40*  CREATININE 1.63* 2.07* 2.02*  CALCIUM 8.6 8.9 8.2*  MG  --  2.0 2.0    Liver Function Tests:  Recent Labs Lab 09/13/13 1349  AST 116*  ALT 109*  ALKPHOS 162*  BILITOT 0.9  PROT 6.9  ALBUMIN 3.0*   No results found for this basename: LIPASE, AMYLASE,  in the last 168 hours No results found for this basename: AMMONIA,  in the last 168 hours  CBC:  Recent Labs Lab 09/11/13 1138 09/13/13 1349  WBC 6.5 6.8  NEUTROABS  --  5.3  HGB 9.9* 9.0*  HCT 31.3* 29.0*  MCV 80.9 82.4  PLT 262 264    Cardiac Enzymes: No results found for this basename: CKTOTAL, CKMB, CKMBINDEX, TROPONINI,  in the last 168 hours  BNP: BNP (last 3 results)  Recent Labs  12/07/12 1200 09/13/13 1349  PROBNP 3310.0* 3883.0*     Other results:   Imaging: Ct Abdomen Pelvis Wo Contrast  09/13/2013   CLINICAL DATA:  73 year old with  history of lateral MI in 2009, occluded left circumflex and right coronary arteries, chronic systolic heart failure, presenting with increasing fatigue, dyspnea on exertion, functional decline and worsening renal functions. Patient being evaluated for potential left ventricular assist device.  EXAM: CT CHEST, ABDOMEN AND PELVIS WITHOUT CONTRAST  TECHNIQUE: Multidetector CT imaging of the chest, abdomen and pelvis was performed following the standard protocol without IV contrast. Oral contrast was administered.  COMPARISON:  No prior CT.  FINDINGS: CT CHEST FINDINGS  Heart enlarged with left ventricular enlargement. Pacing defibrillator lead tips in the right atrial appendage and at the right ventricular apex. Severe 3 vessel coronary atherosclerosis. No pericardial effusion. Minimal epicardial fat.  Mild calcified plaque involving the thoracic aorta without evidence of aneurysm. Numerous normal sized mediastinal lymph nodes; no significant lymphadenopathy in the chest.  Mild interstitial opacities and patchy airspace opacities throughout both lungs, consistent with edema, asymmetric and increased in the left upper lobe. Small bilateral pleural effusions, right greater than left. Central airways patent without significant bronchial wall thickening.  Bone window images demonstrate mild diffuse thoracic spondylosis.  CT ABDOMEN AND PELVIS FINDINGS  Diffuse increased attenuation throughout the liver is consistent with given history of Milrinone therapy. No focal parenchymal abnormality involving the liver. Normal unenhanced appearance of the spleen, pancreas, and adrenal glands. Small gallstones within the otherwise normal-appearing gallbladder. No biliary ductal dilation.  No evidence of hydronephrosis involving either kidney. Within the limits of the unenhanced technique, no focal parenchymal abnormality involving either kidney. Well preserved renal cortex. Very small (1-2 mm) a calculi in mid and lower pole calices  of the left kidney without obstruction. No right urinary tract calculi.  Moderate to severe aortoiliofemoral atherosclerosis without aneurysm. Normal size retroperitoneal and mesenteric lymph nodes; no significant lymphadenopathy.  Polypoid lesion involving the anterior wall of the distal esophagus just above the esophagogastric junction. Stomach decompressed and normal in appearance. Normal appearing small bowel. Diffuse colonic diverticulosis, most extensive in the descending and sigmoid colon, without evidence of acute diverticulitis. Normal appendix in the right upper pelvis. No ascites.  Urinary bladder unremarkable. Moderate prostate gland enlargement, particularly the median lobe, the gland measuring approximately 4.5 x 5.6 x 3.9 cm. Normal seminal vesicles.  Bone window images demonstrate facet degenerative changes throughout the lumbar spine, degenerative disc disease at every lumbar level, severe multifactorial spinal stenosis at L2-3 and moderate multifactorial spinal stenosis at L4-5.  IMPRESSION: CHEST:  1. Cardiomegaly with left ventricular enlargement. Severe 3 vessel coronary atherosclerosis. 2. Interstitial and airspace pulmonary edema and small bilateral pleural effusions consistent with mild CHF and/or fluid overload.  ABDOMEN/PELVIS:  1.  No acute abnormalities involving the abdomen or pelvis. 2. Polypoid lesion involving the anterior wall of the distal esophagus just above the esophagogastric junction. One might consider EGD in further evaluation to confirm this finding. 3. Cholelithiasis without evidence of acute cholecystitis. No biliary ductal dilation. 4. Diffuse increased hepatic attenuation consistent with the given history of Milrinone therapy. No focal hepatic parenchymal abnormality. 5. Non obstructing very small calculi in mid and lower pole calices of the left kidney. No right urinary tract calculi. 6. Diffuse colonic diverticulosis without evidence of acute diverticulitis. 7. Moderate  prostate gland enlargement, particularly the median lobe. 8. Degenerative changes throughout the lumbar spine with multifactorial spinal stenosis at L2-3 (severe) and L4-5 (moderate).   Electronically Signed   By: Evangeline Dakin M.D.   On: 09/13/2013 18:31   Ct Chest Wo Contrast  09/13/2013   CLINICAL DATA:  73 year old with history of lateral MI in 2009, occluded left circumflex and right coronary arteries, chronic systolic heart failure, presenting with increasing fatigue, dyspnea on exertion, functional decline and worsening renal functions. Patient being evaluated for potential left ventricular assist device.  EXAM: CT CHEST, ABDOMEN AND PELVIS WITHOUT CONTRAST  TECHNIQUE: Multidetector CT imaging of the chest, abdomen and pelvis was performed following the standard protocol without IV contrast. Oral contrast was administered.  COMPARISON:  No prior CT.  FINDINGS: CT CHEST FINDINGS  Heart enlarged with left ventricular enlargement. Pacing defibrillator lead tips in the right atrial appendage and at the right ventricular apex. Severe 3 vessel coronary atherosclerosis. No pericardial effusion. Minimal epicardial fat.  Mild calcified plaque involving the thoracic aorta without evidence of aneurysm. Numerous normal sized mediastinal lymph nodes; no significant lymphadenopathy in the chest.  Mild interstitial opacities and patchy airspace opacities throughout both lungs, consistent with edema, asymmetric and increased in the left upper lobe. Small bilateral pleural effusions, right greater than left. Central airways patent without significant bronchial wall thickening.  Bone window images demonstrate mild diffuse thoracic spondylosis.  CT ABDOMEN AND PELVIS FINDINGS  Diffuse increased attenuation throughout the liver is consistent with given history of Milrinone therapy. No focal parenchymal abnormality involving the liver. Normal unenhanced appearance of the spleen, pancreas, and adrenal glands. Small  gallstones within the otherwise normal-appearing gallbladder. No biliary ductal dilation.  No evidence of hydronephrosis involving either kidney. Within the limits of the unenhanced technique, no focal parenchymal abnormality involving either kidney. Well preserved renal cortex. Very small (1-2 mm) a calculi in mid and lower pole calices of the left kidney without obstruction. No right urinary tract calculi.  Moderate to severe aortoiliofemoral atherosclerosis without aneurysm. Normal size retroperitoneal and mesenteric lymph nodes; no significant lymphadenopathy.  Polypoid lesion involving the anterior wall of the distal esophagus just above the esophagogastric junction. Stomach decompressed and normal in appearance. Normal appearing small bowel. Diffuse colonic diverticulosis, most extensive in the descending and sigmoid colon, without evidence of acute diverticulitis. Normal appendix in the right upper pelvis. No ascites.  Urinary bladder unremarkable. Moderate prostate gland enlargement, particularly the median lobe, the gland measuring approximately 4.5 x 5.6 x 3.9 cm. Normal seminal vesicles.  Bone window images demonstrate facet degenerative changes throughout the lumbar spine, degenerative disc disease at every lumbar level, severe multifactorial spinal stenosis at L2-3 and moderate multifactorial spinal stenosis at L4-5.  IMPRESSION: CHEST:  1. Cardiomegaly with left ventricular enlargement. Severe 3 vessel coronary atherosclerosis. 2. Interstitial and airspace pulmonary edema and small bilateral pleural effusions consistent with mild CHF and/or fluid overload.  ABDOMEN/PELVIS:  1. No acute abnormalities involving the abdomen or pelvis. 2. Polypoid lesion involving the anterior wall of the distal esophagus just above the esophagogastric junction. One might consider EGD in further evaluation to confirm this finding. 3. Cholelithiasis without evidence of acute cholecystitis. No biliary ductal dilation. 4.  Diffuse increased hepatic attenuation consistent with the given history of Milrinone therapy. No focal hepatic parenchymal abnormality. 5. Non obstructing very small calculi in mid and lower pole calices of the left kidney. No right urinary tract calculi. 6. Diffuse colonic diverticulosis without evidence of acute diverticulitis. 7. Moderate prostate gland enlargement, particularly the median lobe. 8. Degenerative changes throughout the lumbar spine with multifactorial spinal stenosis at L2-3 (severe) and L4-5 (moderate).   Electronically Signed   By: Evangeline Dakin M.D.   On: 09/13/2013 18:31     Medications:     Scheduled Medications: . alteplase  4 mg Intracatheter Once  . carvedilol  3.125 mg Oral BID WC  . enoxaparin (LOVENOX) injection  30 mg Subcutaneous Q24H  . ezetimibe  10 mg Oral Daily  . hydrALAZINE  25 mg Oral Q8H  . sildenafil  20 mg Oral TID  . sodium chloride  3 mL Intravenous Q12H    Infusions: . sodium chloride 50 mL/hr at 09/13/13 1937  . milrinone 0.375 mcg/kg/min (09/13/13 2007)    PRN Medications: sodium chloride, acetaminophen, ondansetron (ZOFRAN) IV, sodium chloride, zolpidem   Assessment:   1. A/C Systolic Heart Failure ECHO EF 20-25% INTERMACS 2 on transplant list [place]  2. Hypothyroid  3.CAD s/p stent to LCX 2004 with re-occlusion of LCX with lateral MI 2009 and repeat stenting with a Promus, total occlusion of RCA  4. S/P ICD 2009  5. Hyperlipidemia  6. Arthritis S/P TKR 2013  7. Blood Type O negative  8. PVCs    Plan/Discussion:    Hydrated overnight. Now volume status up and co-ox appears low (we are repeating). Renal function minimally improved. Will stop IVF and diurese gently today. Will likely need 2nd inotrope (dobutamine or levophed) to augment cardiac output. IABP still an option if numbers not improving.  If we can optimize, will plan VAD early next week.   SW and Palliative Care consult pending for VAD work up.   The patient is  critically ill with multiple organ systems failure and requires high complexity decision making for assessment and support, frequent evaluation and titration of therapies, application of advanced monitoring technologies and extensive interpretation of multiple databases.   Critical Care Time devoted to patient care services described in this note is 35 Minutes.   Length of Stay: 1  Genevive Bi 09/14/2013, 7:33 AM Advanced Heart Failure Team Pager [phone] (M-F; 7a - 4p)  Please contact [facility] for night-coverage after hours (4p -7a ) and weekends on [email or web address]

## 2013-09-14 NOTE — Plan of Care (Signed)
Problem: Food- and Nutrition-Related Knowledge Deficit (NB-1.1) Goal: Nutrition education Formal process to instruct or train a patient/client in a skill or to impart knowledge to help patients/clients voluntarily manage or modify food choices and eating behavior to maintain or improve health. Outcome: Progressing Nutrition Education Note   RD consulted for education regarding Nutrition optimization prior to surgery.   Discussed importance of adequate nutrition prior to surgery as well as post-op for healing. RD provided "General Healthful Nutrition" and "High Protein Food List" handouts from the Academy of [employer] and Dietetics. Reviewed patient's dietary recall. Provided ideas to promote variety and to incorporate additional nutrient dense foods into patient's diet. Discussed eating small frequent meals and snacks as needed to assist in increasing overall po intake. Encouraged high protein foods. Teach back method used.   Expect good compliance.  Body mass index is 25.79 kg/(m^2). Pt meets criteria for overweight based on current BMI.  Current diet order is NPO s/p cath, however pt endorses good and desire to eat. Labs and medications reviewed. No further nutrition interventions warranted at this time. RD contact information provided. If additional nutrition issues arise, please re-consult RD.   Brynda Greathouse, MS RD LDN  Clinical Inpatient Dietitian  Pager: [phone]  Weekend/After hours pager: [phone]

## 2013-09-14 NOTE — Consult Note (Signed)
[facility] Consult: 4:17 PM 09/14/2013  LOS: 1 day    Referring Provider: Haroldine Laws MD  Primary Care Physician:  Irven Shelling, MD Primary Gastroenterologist:  Dr. Angela Nevin     Reason for Consultation:  Evaluate esophageal polyp   HPI: Johnny Oneill is a 73 y.o. male.  Pt with  End stage ischemic CM.  He is on transplant list at [facility], on home milrinone.  Admitted yesterday with renal failure, fatigue and functional decline.  Has had balloon pump placed today and looking at LVAD placement next week. Is on Heparin drip.   As part of preop workup, had CT scan chest and ab/pelvis.  This shows a polyp in distal esophagus.   Pt had uneventful screening colonoscopy 4 years ago (dad had colon cancer but died due to complications of DM).  Never had EGD.  No dysphagia, no anorexia, no weight loss.  PRN Zantac for ingrequent heartburn.    Past Medical History  Diagnosis Date  . Ischemic cardiomyopathy     a. 10/09 Echo: Sev LV Dysfxn, inf/lat AK, Mod MR.  [name];  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 Hypertension   . PVC (premature ventricular contraction)   . Pacemaker   . CHF (congestive heart failure)   . Shortness of breath   . Automatic implantable cardioverter-defibrillator in situ     Past Surgical History  Procedure Laterality Date  . Defibrillator  2009  . Total knee arthroplasty  11/27/11    left  . Insert / replace / remove pacemaker  2009  .  Coronary angioplasty with stent placement  2004  . Coronary angioplasty with stent placement  07/2008  . Knee arthroscopy w/ partial medial meniscectomy  12/2002    left  . Knee arthroscopy      bilaterally  . Total knee arthroplasty  11/27/2011    Procedure: TOTAL KNEE ARTHROPLASTY;  Surgeon: Yvette Rack., MD;  Location: [facility];  Service: Orthopedics;  Laterality: Left;  left total knee arthroplasty    Prior to Admission medications   Medication Sig Start Date End Date Taking? Authorizing Provider  amiodarone (PACERONE) 200 MG tablet Take 1 tablet (200 mg total) by mouth 2 (two) times daily. 09/08/13  Yes Jolaine Artist, MD  aspirin 325 MG tablet Take 325 mg by mouth daily.     Yes Historical Provider, MD  carvedilol (COREG) 3.125 MG tablet Take 1 tablet (3.125 mg total) by mouth 2 (two) times daily with a meal. 06/26/13  Yes Jolaine Artist, MD  Coenzyme Q10 (CO Q-10 PO)  Take 1 capsule by mouth 2 (two) times daily.    Yes Historical Provider, MD  ezetimibe (ZETIA) 10 MG tablet Take 10 mg by mouth daily.   Yes Historical Provider, MD  furosemide (LASIX) 20 MG tablet Take 20 mg by mouth daily as needed (for 3 lbs or more weight gain per day or 5 lbs or more weight gain per week or as instructed by Dr. Tempie Hoist.). 08/22/13  Yes Jolaine Artist, MD  hydrALAZINE (APRESOLINE) 25 MG tablet Take 1 tablet (25 mg total) by mouth 3 (three) times daily. 05/08/13  Yes Jolaine Artist, MD  isosorbide mononitrate (IMDUR) 30 MG 24 hr tablet Take 0.5 tablets (15 mg total) by mouth daily. 07/13/13  Yes Jolaine Artist, MD  levothyroxine (SYNTHROID, LEVOTHROID) 150 MCG tablet 75-150 mcg daily before breakfast. Take 1 tablet (150 mcg) daily on an empty stomach except take 1/2 tablet (75 mcg) every Friday as directed. 03/16/13  Yes Elayne Snare, MD  milrinone [facility]) 20 MG/100ML SOLN infusion Inject 0.375 mcg/kg/min into the vein continuous. 12/09/12  Yes Amy D Clegg, NP  Omega-3 300 MG CAPS Take 300  mg by mouth daily.    Yes Historical Provider, MD  potassium chloride SA (K-DUR,KLOR-CON) 20 MEQ tablet Take 1 tablet (20 mEq total) by mouth as needed. Take only when you take Furosemide 02/27/13  Yes Amy D Clegg, NP  rosuvastatin (CRESTOR) 20 MG tablet Take 20 mg by mouth daily.   Yes Historical Provider, MD  nitroGLYCERIN (NITROSTAT) 0.4 MG SL tablet Place 0.4 mg under the tongue every 5 (five) minutes as needed. For chest pain    Historical Provider, MD    Scheduled Meds: . alteplase  4 mg Intracatheter Once  . ezetimibe  10 mg Oral Daily  . hydrALAZINE  25 mg Oral Q8H  . levothyroxine  150 mcg Oral Custom   And  . levothyroxine  75 mcg Oral Custom  . sildenafil  20 mg Oral TID  . sodium chloride  3 mL Intravenous Q12H  . sodium chloride  3 mL Intravenous Q12H   Infusions: . sodium chloride 1.6 mL/hr at 09/14/13 1600  . heparin 1,100 Units/hr (09/14/13 1600)  . milrinone 0.377 mcg/kg/min (09/14/13 1600)   PRN Meds: sodium chloride, sodium chloride, acetaminophen, ondansetron (ZOFRAN) IV, sodium chloride, sodium chloride, zolpidem   Allergies as of 09/11/2013 - Review Complete 09/11/2013  Allergen Reaction Noted  . Diovan [valsartan] Other (See Comments) 06/03/2011  . Lipitor [atorvastatin calcium] Other (See Comments) 11/19/2011    Family History  Problem Relation Age of Onset  . Heart failure Father   . Stroke Father   . Coronary artery disease Mother   . Heart disease Mother   . Hyperlipidemia Sister     History   Social History  . Marital Status: Married    Spouse Name: N/A    Number of Children: 1  . Years of Education: N/A   Occupational History  . referee   . cleaning company    Social History Main Topics  . Smoking status: Never Smoker   . Smokeless tobacco: Never Used  . Alcohol Use: Yes     Comment: "occassionlly; last beer was 09/2011"  . Drug Use: No     Comment: "back in our younger days we smoked a little pot"  . Sexual Activity: Not  Currently   Other Topics Concern  . Not on file   Social History Narrative  . No narrative on file  REVIEW OF SYSTEMS: Constitutional:  Per HPI ENT:  No nose bleeds Pulm:  Not a lot of dyspnea, more just fatigue with activity.  No cough CV:  No palpitations, + LE edema.  GU:  No hematuria, no frequency GI:  Per HPI.  No hx of liver disease.  Heme:  No hx of anemia   Transfusions:  none Neuro:  No headaches, no peripheral tingling or numbness Derm:  No itching, no rash or sores.  Endocrine:  No sweats or chills.  No polyuria or dysuria Immunization:  Up to date on flu shot, pneumovax Travel:  None beyond local counties in last few months.    PHYSICAL EXAM: Vital signs in last 24 hours: Filed Vitals:   09/14/13 1600  BP: 126/57  Pulse: 72  Temp: 97.9 F (36.6 C)  Resp: 24   Wt Readings from Last 3 Encounters:  09/14/13 74.7 kg (164 lb 10.9 oz)  09/14/13 74.7 kg (164 lb 10.9 oz)  09/14/13 74.7 kg (164 lb 10.9 oz)   General: remarkably well appearing, comfortable Head:  No swelling, no asymmetry  Eyes:  No icterus or pallor Ears:  Not HOH  Nose:  No congestion or discharge Mouth:  Clear, moist oral MM.  Good dental repair.  Partial plate not in place, some missing teeth Neck:  No mass, swan in right IJ.  Lungs:  Clear, no resp distress.  No cough.   Heart: RRR with snapping of pump audible. 3/6 murmer. Abdomen:  Soft, no mass, not tender or distended.  No bruits.  No HSM Active BS.   Rectal: deferred   Musc/Skeltl: no joint deformity or swelling Extremities:  Feet warm.  1+ pedal edema .  Balloon pump access in Right groin.  Neurologic:  Oriented x 3.  No confusion.  No limb weakness.  Skin:  No telangectasia Tattoos:  none   Psych:  Relaxed, cooperative.    Intake/Output from previous day: 01/14 0701 - 01/15 0700 In: 1429.7 [P.O.:650; I.V.:779.7] Out: 1425 [Urine:1425] Intake/Output this shift: Total I/O In: 771.3 [P.O.:480; I.V.:291.3] Out: 2225  [Urine:2225]  LAB RESULTS:  Recent Labs  09/13/13 1349  WBC 6.8  HGB 9.0*  HCT 29.0*  PLT 264   BMET Lab Results  Component Value Date   NA 141 09/14/2013   NA 141 09/14/2013   NA 141 09/13/2013   K 3.9 09/14/2013   K 3.9 09/14/2013   K 4.5 09/13/2013   CL 102 09/14/2013   CL 104 09/14/2013   CL 104 09/13/2013   CO2 25 09/14/2013   CO2 24 09/14/2013   CO2 22 09/13/2013   GLUCOSE 91 09/14/2013   GLUCOSE 92 09/14/2013   GLUCOSE 101* 09/13/2013   BUN 35* 09/14/2013   BUN 40* 09/14/2013   BUN 41* 09/13/2013   CREATININE 1.83* 09/14/2013   CREATININE 2.02* 09/14/2013   CREATININE 2.07* 09/13/2013   CALCIUM 8.6 09/14/2013   CALCIUM 8.2* 09/14/2013   CALCIUM 8.9 09/13/2013   LFT  Recent Labs  09/13/13 1349  PROT 6.9  ALBUMIN 3.0*  AST 116*  ALT 109*  ALKPHOS 162*  BILITOT 0.9   PT/INR Lab Results  Component Value Date   INR 1.29 09/13/2013   INR 1.13 08/25/2013   INR 1.21 11/24/2012      RADIOLOGY STUDIES: Ct Abdomen Pelvis Wo Contrast Ct Chest Wo Contrast 09/13/2013    FINDINGS: CT CHEST FINDINGS  Heart enlarged with left ventricular enlargement. Pacing defibrillator lead tips in the right atrial appendage and  at the right ventricular apex. Severe 3 vessel coronary atherosclerosis. No pericardial effusion. Minimal epicardial fat.  Mild calcified plaque involving the thoracic aorta without evidence of aneurysm. Numerous normal sized mediastinal lymph nodes; no significant lymphadenopathy in the chest.  Mild interstitial opacities and patchy airspace opacities throughout both lungs, consistent with edema, asymmetric and increased in the left upper lobe. Small bilateral pleural effusions, right greater than left. Central airways patent without significant bronchial wall thickening.  Bone window images demonstrate mild diffuse thoracic spondylosis.  CT ABDOMEN AND PELVIS FINDINGS  Diffuse increased attenuation throughout the liver is consistent with given history of Milrinone therapy. No  focal parenchymal abnormality involving the liver. Normal unenhanced appearance of the spleen, pancreas, and adrenal glands. Small gallstones within the otherwise normal-appearing gallbladder. No biliary ductal dilation.  No evidence of hydronephrosis involving either kidney. Within the limits of the unenhanced technique, no focal parenchymal abnormality involving either kidney. Well preserved renal cortex. Very small (1-2 mm) a calculi in mid and lower pole calices of the left kidney without obstruction. No right urinary tract calculi.  Moderate to severe aortoiliofemoral atherosclerosis without aneurysm. Normal size retroperitoneal and mesenteric lymph nodes; no significant lymphadenopathy.  Polypoid lesion involving the anterior wall of the distal esophagus just above the esophagogastric junction. Stomach decompressed and normal in appearance. Normal appearing small bowel. Diffuse colonic diverticulosis, most extensive in the descending and sigmoid colon, without evidence of acute diverticulitis. Normal appendix in the right upper pelvis. No ascites.  Urinary bladder unremarkable. Moderate prostate gland enlargement, particularly the median lobe, the gland measuring approximately 4.5 x 5.6 x 3.9 cm. Normal seminal vesicles.  Bone window images demonstrate facet degenerative changes throughout the lumbar spine, degenerative disc disease at every lumbar level, severe multifactorial spinal stenosis at L2-3 and moderate multifactorial spinal stenosis at L4-5.  IMPRESSION: CHEST:  1. Cardiomegaly with left ventricular enlargement. Severe 3 vessel coronary atherosclerosis. 2. Interstitial and airspace pulmonary edema and small bilateral pleural effusions consistent with mild CHF and/or fluid overload.  ABDOMEN/PELVIS:  1. No acute abnormalities involving the abdomen or pelvis. 2. Polypoid lesion involving the anterior wall of the distal esophagus just above the esophagogastric junction. One might consider EGD in  further evaluation to confirm this finding. 3. Cholelithiasis without evidence of acute cholecystitis. No biliary ductal dilation. 4. Diffuse increased hepatic attenuation consistent with the given history of Milrinone therapy. No focal hepatic parenchymal abnormality. 5. Non obstructing very small calculi in mid and lower pole calices of the left kidney. No right urinary tract calculi. 6. Diffuse colonic diverticulosis without evidence of acute diverticulitis. 7. Moderate prostate gland enlargement, particularly the median lobe. 8. Degenerative changes throughout the lumbar spine with multifactorial spinal stenosis at L2-3 (severe) and L4-5 (moderate).   Electronically Signed   By: Evangeline Dakin M.D.   On: 09/13/2013 18:31   Dg Chest Port 1 View 09/14/2013   CLINICAL DATA:  Balloon pump placement  EXAM: PORTABLE CHEST - 1 VIEW  COMPARISON:  CT CHEST W/O CM dated 09/13/2013  FINDINGS: There is a Swan-Ganz catheter with the tip within the right lower lobe pulmonary artery segment. There is a 3 lead cardiac pacer. There is interval placement of an intra-aortic balloon pump with the metallic tip at the distal aspect of the aortic arch. The lungs are clear. No focal consolidation, pleural effusion or pneumothorax. Mild cardiomegaly. Unremarkable osseous structures.  IMPRESSION: 1. Intra-aortic balloon pump with the metallic tip at the distal aspect of the aortic arch. 2. Swan-Ganz  catheter with the tip within the right lower lobe pulmonary artery segment.   Electronically Signed   By: Kathreen Devoid   On: 09/14/2013 15:08    ENDOSCOPIC STUDIES: ~ 2011  Colonoscopy No polyps, + diverticulosis.   IMPRESSION:   *  Esophageal polypoid lesion.  Incidental finding on screening chest/ab/pelvic CT  *  End stage ischemic cardiomyopathy. On cardiac transplant list at [facility].  S/p IABP placement.  LVAD planned for next week. On Heparin.   *  Normocytic anemia.  Hgb 11/2012: 12.9. 08/25/13: 10.1, so down 1 gram in last  3 weeks.   *  Cholelithiasis, incidental CT finding.    PLAN:     *  Likely  EGD tomorrow, on Heparin, so diagnostic only.  Pt aware of this and agreeable to proceed. I entered orders in [facility].  This will be bedside case.   If needed, Dr Haroldine Laws can be reached via cell at [phone].    Azucena Freed  09/14/2013, 4:17 PM Pager: [phone]      Attending physician's note   I have taken a history, examined the patient and reviewed the chart. I agree with the Advanced Practitioner's note, impression and recommendations. Distal esophageal polypoid lesion noted on CT scan. EGD tomorrow to further evaluate. No polypectomy or biopsies given need for ongoing IV heparin on balloon pump.  Ladene Artist, MD [facility]

## 2013-09-14 NOTE — Progress Notes (Signed)
ANTICOAGULATION CONSULT NOTE - Initial Consult  Pharmacy Consult for Heparin Indication: IABP  Allergies  Allergen Reactions  . Diovan [Valsartan] Other (See Comments)    Hypotension at low dose, hyperkalemia  . Lipitor [Atorvastatin Calcium] Other (See Comments)    Muscle pain    Patient Measurements: Height: 5\' 7"  (170.2 cm) Weight: 164 lb 10.9 oz (74.7 kg) IBW/kg (Calculated) : 66.1  Vital Signs: Temp: 98.6 F (37 C) (01/15 0720) Temp src: Oral (01/15 0720) BP: 111/69 mmHg (01/15 0758) Pulse Rate: 77 (01/15 0758)  Labs:  Recent Labs  09/11/13 1138 09/13/13 1349 09/14/13 0430  HGB 9.9* 9.0*  --   HCT 31.3* 29.0*  --   PLT 262 264  --   LABPROT  --  15.8*  --   INR  --  1.29  --   CREATININE 1.63* 2.07* 2.02*    Estimated Creatinine Clearance: 31.8 ml/min (by C-G formula based on Cr of 2.02).   Medical History: Past Medical History  Diagnosis Date  . Ischemic cardiomyopathy     a. 10/09 Echo: Sev LV Dysfxn, inf/lat AK, Mod MR.  [name];  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 Hypertension   . PVC (premature ventricular contraction)   . Pacemaker   . CHF (congestive heart failure)   . Shortness of breath   . Automatic implantable cardioverter-defibrillator in situ     Assessment: 73 y.o. male admitted 09/13/2013 with LEE, DOE and rising SCr for LVAD work up.  Pharmacy consulted to dose heparin after IABP placed.  PMH: HFrEF (EF 25%), ICD, CAD, hypothyroid, hyperlipidemia, on TPLT list.   Goal of Therapy:  Heparin level 0.3-0.7 units/ml Monitor platelets by anticoagulation protocol: Yes   Plan:  1. Heparin  4000 units iv x 1 given in cath lab, then 1100 units/h started after IABP placement 2.  Will check heparin level 8h after heparin ggt start at 1900 this evening.   Thank you for allowing pharmacy to be a part of this patients care team.  Rowe Robert Pharm.D., BCPS Clinical Pharmacist 09/14/2013 11:16 AM Pager: [phone] Phone: [phone]

## 2013-09-14 NOTE — Progress Notes (Signed)
Echo Lab  2D Echocardiogram completed.  [place], RDCS 09/14/2013 8:51 AM

## 2013-09-14 NOTE — CV Procedure (Signed)
Cardiac Cath Procedure Note:  Indication: IABP  Procedures performed:  1) Intra-aortic balloon pump insertion  Description of procedure:   The risks and indication of the procedure were explained. Consent was signed and placed on the chart. An appropriate timeout was taken prior to the procedure. The right groin was prepped and draped in the routine sterile fashion and anesthetized with 1% local lidocaine.   A 5 FR arterial sheath was placed in the right femoral artery using a modified Seldinger technique and the changed out for an IABP sheath. The IABP was place in the descending aorta under fluoroscopic guidance. He was given 4,000 units systemic heparin and started on a drip. Waveforms confirmed good augmentation.   Complications:  None apparent  Assessment:  Successful placement of IABP for refractory cardiogenic shock.    Shanvi Moyd 11:20 AM

## 2013-09-14 NOTE — Interval H&P Note (Signed)
History and Physical Interval Note:  09/14/2013 11:19 AM  Johnny Oneill  has presented today for surgery, with the diagnosis of hf  The various methods of treatment have been discussed with the patient and family. After consideration of risks, benefits and other options for treatment, the patient has consented to  Procedure(s): [place] (N/A) as a surgical intervention .  The patient's history has been reviewed, patient examined, no change in status, stable for surgery.  I have reviewed the patient's chart and labs.  Questions were answered to the patient's satisfaction.     Umaiza Matusik

## 2013-09-14 NOTE — Progress Notes (Signed)
ANTICOAGULATION CONSULT NOTE - Follow Up Consult  Pharmacy Consult for heparin Indication: new IABP  Allergies  Allergen Reactions  . Diovan [Valsartan] Other (See Comments)    Hypotension at low dose, hyperkalemia  . Lipitor [Atorvastatin Calcium] Other (See Comments)    Muscle pain    Patient Measurements: Height: 5\' 7"  (170.2 cm) Weight: 164 lb 10.9 oz (74.7 kg) IBW/kg (Calculated) : 66.1 Heparin Dosing Weight: 74.7 kg  Vital Signs: Temp: 98.1 F (36.7 C) (01/15 1930) Temp src: Core (Comment) (01/15 1200) BP: 99/40 mmHg (01/15 1930) Pulse Rate: 75 (01/15 1930)  Labs:  Recent Labs  09/13/13 1349 09/14/13 0430 09/14/13 1445  HGB 9.0*  --   --   HCT 29.0*  --   --   PLT 264  --   --   LABPROT 15.8*  --   --   INR 1.29  --   --   CREATININE 2.07* 2.02* 1.83*    Estimated Creatinine Clearance: 35.1 ml/min (by C-G formula based on Cr of 1.83).   Medications:  Scheduled:  . alteplase  4 mg Intracatheter Once  . ezetimibe  10 mg Oral Daily  . furosemide  40 mg Intravenous Q12H  . hydrALAZINE  25 mg Oral Q8H  . levothyroxine  150 mcg Oral Custom   And  . levothyroxine  75 mcg Oral Custom  . sildenafil  20 mg Oral TID  . sodium chloride  3 mL Intravenous Q12H  . sodium chloride  3 mL Intravenous Q12H   Infusions:  . sodium chloride 1.6 mL/hr at 09/14/13 1900  . heparin 1,100 Units/hr (09/14/13 1900)  . milrinone 0.377 mcg/kg/min (09/14/13 1900)    Assessment: 73 yo male with new IABP is currently on subtherapeutic heparin.  Heparin level is 0.18. Patient is on cardiac [facility] list at [facility]  LVAD planned for next week. Will likely have EGD tom.  Per RN, no problem with heparin infusion.  Goal of Therapy:  Heparin level 0.2-0.5 Monitor platelets by anticoagulation protocol: Yes   Plan:  1) Increase heparin to 1200 units/hr. No bolus 2) Plan to stop heparin 2h priorto EGD tom   Delray Reza, Tsz-Yin 09/14/2013,9:12 PM

## 2013-09-15 ENCOUNTER — Encounter (HOSPITAL_COMMUNITY): Payer: Self-pay | Admitting: Gastroenterology

## 2013-09-15 ENCOUNTER — Encounter (HOSPITAL_COMMUNITY)
Admission: AD | Disposition: A | Discharge status: Home Health | Payer: Self-pay | Source: Ambulatory Visit | Attending: Cardiothoracic Surgery

## 2013-09-15 ENCOUNTER — Inpatient Hospital Stay (HOSPITAL_COMMUNITY): Payer: Medicare Other

## 2013-09-15 DIAGNOSIS — R531 Weakness: Secondary | ICD-10-CM

## 2013-09-15 DIAGNOSIS — I5023 Acute on chronic systolic (congestive) heart failure: Secondary | ICD-10-CM

## 2013-09-15 DIAGNOSIS — Z515 Encounter for palliative care: Secondary | ICD-10-CM

## 2013-09-15 HISTORY — PX: ESOPHAGOGASTRODUODENOSCOPY: SHX5428

## 2013-09-15 LAB — LUPUS ANTICOAGULANT PANEL
DRVVT: 48.2 secs — ABNORMAL HIGH (ref <42.9)
Lupus Anticoagulant: NOT DETECTED
PTT Lupus Anticoagulant: 52.7 secs — ABNORMAL HIGH (ref 28.0–43.0)
PTTLA 4:1 Mix: 48.7 secs — ABNORMAL HIGH (ref 28.0–43.0)
PTTLA Confirmation: 4.8 secs (ref <8.0)
dRVVT Incubated 1:1 Mix: 38.5 secs (ref <42.9)

## 2013-09-15 LAB — CBC
HCT: 26.4 % — ABNORMAL LOW (ref 39.0–52.0)
Hemoglobin: 8.4 g/dL — ABNORMAL LOW (ref 13.0–17.0)
MCH: 25.8 pg — ABNORMAL LOW (ref 26.0–34.0)
MCHC: 31.8 g/dL (ref 30.0–36.0)
MCV: 81 fL (ref 78.0–100.0)
Platelets: 240 10*3/uL (ref 150–400)
RBC: 3.26 MIL/uL — ABNORMAL LOW (ref 4.22–5.81)
RDW: 17.9 % — ABNORMAL HIGH (ref 11.5–15.5)
WBC: 6.3 10*3/uL (ref 4.0–10.5)

## 2013-09-15 LAB — BASIC METABOLIC PANEL
BUN: 31 mg/dL — ABNORMAL HIGH (ref 6–23)
BUN: 25 mg/dL — ABNORMAL HIGH (ref 6–23)
CO2: 26 mEq/L (ref 19–32)
CO2: 25 mEq/L (ref 19–32)
Calcium: 7.9 mg/dL — ABNORMAL LOW (ref 8.4–10.5)
Calcium: 8.3 mg/dL — ABNORMAL LOW (ref 8.4–10.5)
Chloride: 101 mEq/L (ref 96–112)
Chloride: 104 mEq/L (ref 96–112)
Creatinine, Ser: 1.86 mg/dL — ABNORMAL HIGH (ref 0.50–1.35)
Creatinine, Ser: 1.5 mg/dL — ABNORMAL HIGH (ref 0.50–1.35)
GFR calc Af Amer: 41 mL/min — ABNORMAL LOW (ref >90)
GFR calc Af Amer: 53 mL/min — ABNORMAL LOW (ref >90)
GFR calc non Af Amer: 35 mL/min — ABNORMAL LOW (ref >90)
GFR calc non Af Amer: 45 mL/min — ABNORMAL LOW (ref >90)
Glucose, Bld: 100 mg/dL — ABNORMAL HIGH (ref 70–99)
Glucose, Bld: 160 mg/dL — ABNORMAL HIGH (ref 70–99)
Potassium: 3.7 mEq/L (ref 3.7–5.3)
Potassium: 3.6 mEq/L — ABNORMAL LOW (ref 3.7–5.3)
Sodium: 138 mEq/L (ref 137–147)
Sodium: 140 mEq/L (ref 137–147)

## 2013-09-15 LAB — RESPIRATORY VIRUS PANEL
Adenovirus: NOT DETECTED
Influenza A H1: NOT DETECTED
Influenza A H3: NOT DETECTED
Influenza A: DETECTED — AB
Influenza B: NOT DETECTED
Metapneumovirus: DETECTED — AB
Parainfluenza 1: NOT DETECTED
Parainfluenza 2: NOT DETECTED
Parainfluenza 3: NOT DETECTED
Respiratory Syncytial Virus A: NOT DETECTED
Respiratory Syncytial Virus B: NOT DETECTED
Rhinovirus: NOT DETECTED

## 2013-09-15 LAB — CARBOXYHEMOGLOBIN
Carboxyhemoglobin: 1.7 % — ABNORMAL HIGH (ref 0.5–1.5)
Methemoglobin: 0.9 % (ref 0.0–1.5)
O2 Saturation: 55.5 %
Total hemoglobin: 8.3 g/dL — ABNORMAL LOW (ref 13.5–18.0)

## 2013-09-15 LAB — HEPARIN LEVEL (UNFRACTIONATED)
Heparin Unfractionated: 0.11 IU/mL — ABNORMAL LOW (ref 0.30–0.70)
Heparin Unfractionated: <0.1 IU/mL — ABNORMAL LOW (ref 0.30–0.70)

## 2013-09-15 LAB — GLUCOSE, CAPILLARY: Glucose-Capillary: 94 mg/dL (ref 70–99)

## 2013-09-15 LAB — FACTOR 5 LEIDEN

## 2013-09-15 SURGERY — EGD (ESOPHAGOGASTRODUODENOSCOPY)
Anesthesia: Moderate Sedation

## 2013-09-15 MED ORDER — MIDAZOLAM HCL 5 MG/ML IJ SOLN
INTRAMUSCULAR | Status: AC
  Filled 2013-09-15: qty 2

## 2013-09-15 MED ORDER — PANTOPRAZOLE SODIUM 40 MG PO TBEC
40.0000 mg | DELAYED_RELEASE_TABLET | Freq: Every day | ORAL | Status: DC
  Administered 2013-09-15 – 2013-09-17 (×3): 40 mg via ORAL
  Filled 2013-09-15 (×3): qty 1

## 2013-09-15 MED ORDER — SODIUM CHLORIDE 0.9 % IV BOLUS (SEPSIS)
250.0000 mL | Freq: Once | INTRAVENOUS | Status: AC
  Administered 2013-09-15: 250 mL via INTRAVENOUS

## 2013-09-15 MED ORDER — MIDAZOLAM HCL 10 MG/2ML IJ SOLN
INTRAMUSCULAR | Status: DC | PRN
  Administered 2013-09-15 (×2): 2 mg via INTRAVENOUS
  Administered 2013-09-15: 1 mg via INTRAVENOUS

## 2013-09-15 MED ORDER — FENTANYL CITRATE 0.05 MG/ML IJ SOLN
INTRAMUSCULAR | Status: AC
  Filled 2013-09-15: qty 2

## 2013-09-15 MED ORDER — BUTAMBEN-TETRACAINE-BENZOCAINE 2-2-14 % EX AERO
INHALATION_SPRAY | CUTANEOUS | Status: DC | PRN
  Administered 2013-09-15: 2 via TOPICAL

## 2013-09-15 MED ORDER — FENTANYL CITRATE 0.05 MG/ML IJ SOLN
INTRAMUSCULAR | Status: DC | PRN
  Administered 2013-09-15 (×2): 25 ug via INTRAVENOUS

## 2013-09-15 MED ORDER — FUROSEMIDE 40 MG PO TABS
40.0000 mg | ORAL_TABLET | Freq: Once | ORAL | Status: AC
Start: 2013-09-15 — End: 2013-09-15
  Administered 2013-09-15: 40 mg via ORAL
  Filled 2013-09-15: qty 1

## 2013-09-15 NOTE — H&P (View-Only) (Signed)
[facility] Consult: 4:17 PM 09/14/2013  LOS: 1 day    Referring Provider: Haroldine Laws MD  Primary Care Physician:  Irven Shelling, MD Primary Gastroenterologist:  Dr. Angela Nevin     Reason for Consultation:  Evaluate esophageal polyp   HPI: ABBIE Oneill is a 73 y.o. male.  Pt with  End stage ischemic CM.  He is on transplant list at [facility], on home milrinone.  Admitted yesterday with renal failure, fatigue and functional decline.  Has had balloon pump placed today and looking at LVAD placement next week. Is on Heparin drip.   As part of preop workup, had CT scan chest and ab/pelvis.  This shows a polyp in distal esophagus.   Pt had uneventful screening colonoscopy 4 years ago (dad had colon cancer but died due to complications of DM).  Never had EGD.  No dysphagia, no anorexia, no weight loss.  PRN Zantac for ingrequent heartburn.    Past Medical History  Diagnosis Date  . Ischemic cardiomyopathy     a. 10/09 Echo: Sev LV Dysfxn, inf/lat AK, Mod MR.  [name];  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 Hypertension   . PVC (premature ventricular contraction)   . Pacemaker   . CHF (congestive heart failure)   . Shortness of breath   . Automatic implantable cardioverter-defibrillator in situ     Past Surgical History  Procedure Laterality Date  . Defibrillator  2009  . Total knee arthroplasty  11/27/11    left  . Insert / replace / remove pacemaker  2009  .  Coronary angioplasty with stent placement  2004  . Coronary angioplasty with stent placement  07/2008  . Knee arthroscopy w/ partial medial meniscectomy  12/2002    left  . Knee arthroscopy      bilaterally  . Total knee arthroplasty  11/27/2011    Procedure: TOTAL KNEE ARTHROPLASTY;  Surgeon: Yvette Rack., MD;  Location: [facility];  Service: Orthopedics;  Laterality: Left;  left total knee arthroplasty    Prior to Admission medications   Medication Sig Start Date End Date Taking? Authorizing Provider  amiodarone (PACERONE) 200 MG tablet Take 1 tablet (200 mg total) by mouth 2 (two) times daily. 09/08/13  Yes Jolaine Artist, MD  aspirin 325 MG tablet Take 325 mg by mouth daily.     Yes Historical Provider, MD  carvedilol (COREG) 3.125 MG tablet Take 1 tablet (3.125 mg total) by mouth 2 (two) times daily with a meal. 06/26/13  Yes Jolaine Artist, MD  Coenzyme Q10 (CO Q-10 PO)  Take 1 capsule by mouth 2 (two) times daily.    Yes Historical Provider, MD  ezetimibe (ZETIA) 10 MG tablet Take 10 mg by mouth daily.   Yes Historical Provider, MD  furosemide (LASIX) 20 MG tablet Take 20 mg by mouth daily as needed (for 3 lbs or more weight gain per day or 5 lbs or more weight gain per week or as instructed by Dr. Tempie Hoist.). 08/22/13  Yes Jolaine Artist, MD  hydrALAZINE (APRESOLINE) 25 MG tablet Take 1 tablet (25 mg total) by mouth 3 (three) times daily. 05/08/13  Yes Jolaine Artist, MD  isosorbide mononitrate (IMDUR) 30 MG 24 hr tablet Take 0.5 tablets (15 mg total) by mouth daily. 07/13/13  Yes Jolaine Artist, MD  levothyroxine (SYNTHROID, LEVOTHROID) 150 MCG tablet 75-150 mcg daily before breakfast. Take 1 tablet (150 mcg) daily on an empty stomach except take 1/2 tablet (75 mcg) every Friday as directed. 03/16/13  Yes Elayne Snare, MD  milrinone [employer]) 20 MG/100ML SOLN infusion Inject 0.375 mcg/kg/min into the vein continuous. 12/09/12  Yes Amy D Clegg, NP  Omega-3 300 MG CAPS Take 300  mg by mouth daily.    Yes Historical Provider, MD  potassium chloride SA (K-DUR,KLOR-CON) 20 MEQ tablet Take 1 tablet (20 mEq total) by mouth as needed. Take only when you take Furosemide 02/27/13  Yes Amy D Clegg, NP  rosuvastatin (CRESTOR) 20 MG tablet Take 20 mg by mouth daily.   Yes Historical Provider, MD  nitroGLYCERIN (NITROSTAT) 0.4 MG SL tablet Place 0.4 mg under the tongue every 5 (five) minutes as needed. For chest pain    Historical Provider, MD    Scheduled Meds: . alteplase  4 mg Intracatheter Once  . ezetimibe  10 mg Oral Daily  . hydrALAZINE  25 mg Oral Q8H  . levothyroxine  150 mcg Oral Custom   And  . levothyroxine  75 mcg Oral Custom  . sildenafil  20 mg Oral TID  . sodium chloride  3 mL Intravenous Q12H  . sodium chloride  3 mL Intravenous Q12H   Infusions: . sodium chloride 1.6 mL/hr at 09/14/13 1600  . heparin 1,100 Units/hr (09/14/13 1600)  . milrinone 0.377 mcg/kg/min (09/14/13 1600)   PRN Meds: sodium chloride, sodium chloride, acetaminophen, ondansetron (ZOFRAN) IV, sodium chloride, sodium chloride, zolpidem   Allergies as of 09/11/2013 - Review Complete 09/11/2013  Allergen Reaction Noted  . Diovan [valsartan] Other (See Comments) 06/03/2011  . Lipitor [atorvastatin calcium] Other (See Comments) 11/19/2011    Family History  Problem Relation Age of Onset  . Heart failure Father   . Stroke Father   . Coronary artery disease Mother   . Heart disease Mother   . Hyperlipidemia Sister     History   Social History  . Marital Status: Married    Spouse Name: N/A    Number of Children: 1  . Years of Education: N/A   Occupational History  . referee   . cleaning company    Social History Main Topics  . Smoking status: Never Smoker   . Smokeless tobacco: Never Used  . Alcohol Use: Yes     Comment: "occassionlly; last beer was 09/2011"  . Drug Use: No     Comment: "back in our younger days we smoked a little pot"  . Sexual Activity: Not  Currently   Other Topics Concern  . Not on file   Social History Narrative  . No narrative on file  REVIEW OF SYSTEMS: Constitutional:  Per HPI ENT:  No nose bleeds Pulm:  Not a lot of dyspnea, more just fatigue with activity.  No cough CV:  No palpitations, + LE edema.  GU:  No hematuria, no frequency GI:  Per HPI.  No hx of liver disease.  Heme:  No hx of anemia   Transfusions:  none Neuro:  No headaches, no peripheral tingling or numbness Derm:  No itching, no rash or sores.  Endocrine:  No sweats or chills.  No polyuria or dysuria Immunization:  Up to date on flu shot, pneumovax Travel:  None beyond local counties in last few months.    PHYSICAL EXAM: Vital signs in last 24 hours: Filed Vitals:   09/14/13 1600  BP: 126/57  Pulse: 72  Temp: 97.9 F (36.6 C)  Resp: 24   Wt Readings from Last 3 Encounters:  09/14/13 74.7 kg (164 lb 10.9 oz)  09/14/13 74.7 kg (164 lb 10.9 oz)  09/14/13 74.7 kg (164 lb 10.9 oz)   General: remarkably well appearing, comfortable Head:  No swelling, no asymmetry  Eyes:  No icterus or pallor Ears:  Not HOH  Nose:  No congestion or discharge Mouth:  Clear, moist oral MM.  Good dental repair.  Partial plate not in place, some missing teeth Neck:  No mass, swan in right IJ.  Lungs:  Clear, no resp distress.  No cough.   Heart: RRR with snapping of pump audible. 3/6 murmer. Abdomen:  Soft, no mass, not tender or distended.  No bruits.  No HSM Active BS.   Rectal: deferred   Musc/Skeltl: no joint deformity or swelling Extremities:  Feet warm.  1+ pedal edema .  Balloon pump access in Right groin.  Neurologic:  Oriented x 3.  No confusion.  No limb weakness.  Skin:  No telangectasia Tattoos:  none   Psych:  Relaxed, cooperative.    Intake/Output from previous day: 01/14 0701 - 01/15 0700 In: 1429.7 [P.O.:650; I.V.:779.7] Out: 1425 [Urine:1425] Intake/Output this shift: Total I/O In: 771.3 [P.O.:480; I.V.:291.3] Out: 2225  [Urine:2225]  LAB RESULTS:  Recent Labs  09/13/13 1349  WBC 6.8  HGB 9.0*  HCT 29.0*  PLT 264   BMET Lab Results  Component Value Date   NA 141 09/14/2013   NA 141 09/14/2013   NA 141 09/13/2013   K 3.9 09/14/2013   K 3.9 09/14/2013   K 4.5 09/13/2013   CL 102 09/14/2013   CL 104 09/14/2013   CL 104 09/13/2013   CO2 25 09/14/2013   CO2 24 09/14/2013   CO2 22 09/13/2013   GLUCOSE 91 09/14/2013   GLUCOSE 92 09/14/2013   GLUCOSE 101* 09/13/2013   BUN 35* 09/14/2013   BUN 40* 09/14/2013   BUN 41* 09/13/2013   CREATININE 1.83* 09/14/2013   CREATININE 2.02* 09/14/2013   CREATININE 2.07* 09/13/2013   CALCIUM 8.6 09/14/2013   CALCIUM 8.2* 09/14/2013   CALCIUM 8.9 09/13/2013   LFT  Recent Labs  09/13/13 1349  PROT 6.9  ALBUMIN 3.0*  AST 116*  ALT 109*  ALKPHOS 162*  BILITOT 0.9   PT/INR Lab Results  Component Value Date   INR 1.29 09/13/2013   INR 1.13 08/25/2013   INR 1.21 11/24/2012      RADIOLOGY STUDIES: Ct Abdomen Pelvis Wo Contrast Ct Chest Wo Contrast 09/13/2013    FINDINGS: CT CHEST FINDINGS  Heart enlarged with left ventricular enlargement. Pacing defibrillator lead tips in the right atrial appendage and  at the right ventricular apex. Severe 3 vessel coronary atherosclerosis. No pericardial effusion. Minimal epicardial fat.  Mild calcified plaque involving the thoracic aorta without evidence of aneurysm. Numerous normal sized mediastinal lymph nodes; no significant lymphadenopathy in the chest.  Mild interstitial opacities and patchy airspace opacities throughout both lungs, consistent with edema, asymmetric and increased in the left upper lobe. Small bilateral pleural effusions, right greater than left. Central airways patent without significant bronchial wall thickening.  Bone window images demonstrate mild diffuse thoracic spondylosis.  CT ABDOMEN AND PELVIS FINDINGS  Diffuse increased attenuation throughout the liver is consistent with given history of Milrinone therapy. No  focal parenchymal abnormality involving the liver. Normal unenhanced appearance of the spleen, pancreas, and adrenal glands. Small gallstones within the otherwise normal-appearing gallbladder. No biliary ductal dilation.  No evidence of hydronephrosis involving either kidney. Within the limits of the unenhanced technique, no focal parenchymal abnormality involving either kidney. Well preserved renal cortex. Very small (1-2 mm) a calculi in mid and lower pole calices of the left kidney without obstruction. No right urinary tract calculi.  Moderate to severe aortoiliofemoral atherosclerosis without aneurysm. Normal size retroperitoneal and mesenteric lymph nodes; no significant lymphadenopathy.  Polypoid lesion involving the anterior wall of the distal esophagus just above the esophagogastric junction. Stomach decompressed and normal in appearance. Normal appearing small bowel. Diffuse colonic diverticulosis, most extensive in the descending and sigmoid colon, without evidence of acute diverticulitis. Normal appendix in the right upper pelvis. No ascites.  Urinary bladder unremarkable. Moderate prostate gland enlargement, particularly the median lobe, the gland measuring approximately 4.5 x 5.6 x 3.9 cm. Normal seminal vesicles.  Bone window images demonstrate facet degenerative changes throughout the lumbar spine, degenerative disc disease at every lumbar level, severe multifactorial spinal stenosis at L2-3 and moderate multifactorial spinal stenosis at L4-5.  IMPRESSION: CHEST:  1. Cardiomegaly with left ventricular enlargement. Severe 3 vessel coronary atherosclerosis. 2. Interstitial and airspace pulmonary edema and small bilateral pleural effusions consistent with mild CHF and/or fluid overload.  ABDOMEN/PELVIS:  1. No acute abnormalities involving the abdomen or pelvis. 2. Polypoid lesion involving the anterior wall of the distal esophagus just above the esophagogastric junction. One might consider EGD in  further evaluation to confirm this finding. 3. Cholelithiasis without evidence of acute cholecystitis. No biliary ductal dilation. 4. Diffuse increased hepatic attenuation consistent with the given history of Milrinone therapy. No focal hepatic parenchymal abnormality. 5. Non obstructing very small calculi in mid and lower pole calices of the left kidney. No right urinary tract calculi. 6. Diffuse colonic diverticulosis without evidence of acute diverticulitis. 7. Moderate prostate gland enlargement, particularly the median lobe. 8. Degenerative changes throughout the lumbar spine with multifactorial spinal stenosis at L2-3 (severe) and L4-5 (moderate).   Electronically Signed   By: Evangeline Dakin M.D.   On: 09/13/2013 18:31   Dg Chest Port 1 View 09/14/2013   CLINICAL DATA:  Balloon pump placement  EXAM: PORTABLE CHEST - 1 VIEW  COMPARISON:  CT CHEST W/O CM dated 09/13/2013  FINDINGS: There is a Swan-Ganz catheter with the tip within the right lower lobe pulmonary artery segment. There is a 3 lead cardiac pacer. There is interval placement of an intra-aortic balloon pump with the metallic tip at the distal aspect of the aortic arch. The lungs are clear. No focal consolidation, pleural effusion or pneumothorax. Mild cardiomegaly. Unremarkable osseous structures.  IMPRESSION: 1. Intra-aortic balloon pump with the metallic tip at the distal aspect of the aortic arch. 2. Swan-Ganz  catheter with the tip within the right lower lobe pulmonary artery segment.   Electronically Signed   By: Kathreen Devoid   On: 09/14/2013 15:08    ENDOSCOPIC STUDIES: ~ 2011  Colonoscopy No polyps, + diverticulosis.   IMPRESSION:   *  Esophageal polypoid lesion.  Incidental finding on screening chest/ab/pelvic CT  *  End stage ischemic cardiomyopathy. On cardiac transplant list at [facility].  S/p IABP placement.  LVAD planned for next week. On Heparin.   *  Normocytic anemia.  Hgb 11/2012: 12.9. 08/25/13: 10.1, so down 1 gram in last  3 weeks.   *  Cholelithiasis, incidental CT finding.    PLAN:     *  Likely  EGD tomorrow, on Heparin, so diagnostic only.  Pt aware of this and agreeable to proceed. I entered orders in [facility].  This will be bedside case.   If needed, Dr Haroldine Laws can be reached via cell at [phone].    Azucena Freed  09/14/2013, 4:17 PM Pager: [phone]      Attending physician's note   I have taken a history, examined the patient and reviewed the chart. I agree with the Advanced Practitioner's note, impression and recommendations. Distal esophageal polypoid lesion noted on CT scan. EGD tomorrow to further evaluate. No polypectomy or biopsies given need for ongoing IV heparin on balloon pump.  Ladene Artist, MD [facility]

## 2013-09-15 NOTE — Progress Notes (Signed)
Orthopedic Tech Progress Note Patient Details:  Johnny Oneill 02-27-1941 [id number]  Ortho Devices Type of Ortho Device: Knee Immobilizer Ortho Device/Splint Location: rle Ortho Device/Splint Interventions: Application   [name] 09/15/2013, 1:56 PM

## 2013-09-15 NOTE — Op Note (Signed)
[facility] [place], [id number]   ENDOSCOPY PROCEDURE REPORT  PATIENT: Johnny Oneill, Johnny Oneill  MR#: [id number] BIRTHDATE: 09/17/1940 , 72  yrs. old GENDER: Male ENDOSCOPIST: Ladene Artist, MD, [facility] REFERRED BY:  Jolaine Artist, M.D. PROCEDURE DATE:  09/15/2013 PROCEDURE:  EGD, diagnostic ASA CLASS:     Class III INDICATIONS:  abnormal CT of the GI tract-possible distal esophageal polyp noted MEDICATIONS: These medications were titrated to patient response per physician's verbal order, Fentanyl 50 mcg IV, and Versed 5 mg IV TOPICAL ANESTHETIC: Cetacaine Spray DESCRIPTION OF PROCEDURE: After the risks benefits and alternatives of the procedure were thoroughly explained, informed consent was obtained.  The Pentax Gastroscope [id number] endoscope was introduced through the mouth and advanced to the second portion of the duodenum. Without limitations.  The instrument was slowly withdrawn as the mucosa was fully examined.  ESOPHAGUS: The mucosa of the esophagus appeared normal.  No polyp or any other abnormality noted. STOMACH: The mucosa and folds of the stomach appeared normal. DUODENUM: The duodenal mucosa showed no abnormalities in the bulb and second portion of the duodenum.  Retroflexed views revealed no abnormalities.  The scope was then withdrawn from the patient and the procedure completed.  COMPLICATIONS: There were no complications.  ENDOSCOPIC IMPRESSION: 1.   The EGD appeared normal  RECOMMENDATIONS: 1.  None 2.  GI follow up as needed  D]  eSigned:  Ladene Artist, MD, [facility] 09/15/2013 3:12 PM

## 2013-09-15 NOTE — Progress Notes (Signed)
ANTICOAGULATION CONSULT NOTE - Initial Consult  Pharmacy Consult for Heparin Indication: IABP  Allergies  Allergen Reactions  . Diovan [Valsartan] Other (See Comments)    Hypotension at low dose, hyperkalemia  . Lipitor [Atorvastatin Calcium] Other (See Comments)    Muscle pain    Patient Measurements: Height: 5\' 7"  (170.2 cm) Weight: 165 lb 5.5 oz (75 kg) IBW/kg (Calculated) : 66.1  Vital Signs: Temp: 98.2 F (36.8 C) (01/16 1630) Temp src: Core (Comment) (01/16 1200) BP: 106/66 mmHg (01/16 1630) Pulse Rate: 76 (01/16 1630)  Labs:  Recent Labs  09/13/13 1349 09/14/13 0430 09/14/13 1445 09/14/13 2040 09/15/13 0520  HGB 9.0*  --   --   --  8.4*  HCT 29.0*  --   --   --  26.4*  PLT 264  --   --   --  240  LABPROT 15.8*  --   --   --   --   INR 1.29  --   --   --   --   HEPARINUNFRC  --   --   --  0.18* 0.11*  CREATININE 2.07* 2.02* 1.83*  --  1.86*    Estimated Creatinine Clearance: 34.6 ml/min (by C-G formula based on Cr of 1.86).   Medical History: Past Medical History  Diagnosis Date  . Ischemic cardiomyopathy     a. 10/09 Echo: Sev LV Dysfxn, inf/lat AK, Mod MR.  [name];  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 Hypertension   . PVC (premature ventricular contraction)   . Pacemaker   . CHF (congestive heart failure)   . Shortness of breath   . Automatic implantable cardioverter-defibrillator in situ     Assessment: 73 y.o. male admitted 09/13/2013 with LEE, DOE and rising SCr for LVAD work up.  Pharmacy consulted to dose heparin after IABP placed.  PMH: HFrEF (EF 25%), ICD, CAD, hypothyroid, hyperlipidemia, on  TPLT list.  Coag: IABP:  Heparin resumed post EGD.  Plan to check heparin level at 6h after resume at 1500.   Goal of Therapy:  Heparin level 0.2-0.5 units/ml Monitor platelets by anticoagulation protocol: Yes   Plan:  1. Heparin at 1350 units/hr 2. Will check heparin level 6h after ggt restart.  Thank you for allowing pharmacy to be a part of this patients care team.  Rowe Robert Pharm.D., BCPS Clinical Pharmacist 09/15/2013 4:45 PM Pager: [phone] Phone: [phone]

## 2013-09-15 NOTE — Progress Notes (Signed)
CSW met with patient at bedside to complete LVAD assessment. Patient verbalized understanding of procedure, post surgical plan and positive psychosocial assessment. Patient is very motivated with positive attitude and reports "I am looking forward to be able to do more things". CSW spoke with wife via phone as she was unable to make appointment this morning. Wife verbalized understanding of LVAD placement and caregiver role and responsibility. She states " I am glad we are going through with this". Patient appears to be a good psychosocial candidate for LVAD placement with no issues or concerns identified. Complete LVAD assessment to follow. Raquel Sarna, [place]

## 2013-09-15 NOTE — Interval H&P Note (Signed)
History and Physical Interval Note:  09/15/2013 1:42 PM  Johnny Oneill  has presented today for surgery, with the diagnosis of polyp in esophagus on CT scan.  The various methods of treatment have been discussed with the patient and family. After consideration of risks, benefits and other options for treatment, the patient has consented to  Procedure(s) with comments: ESOPHAGOGASTRODUODENOSCOPY (EGD) (N/A) - bedside as a surgical intervention .  The patient's history has been reviewed, patient examined, no change in status, stable for surgery.  I have reviewed the patient's chart and labs.  Questions were answered to the patient's satisfaction.     Pricilla Riffle. Fuller Plan MD

## 2013-09-15 NOTE — Progress Notes (Signed)
ANTICOAGULATION CONSULT NOTE - Follow Up Consult  Pharmacy Consult for Heparin Indication: IABP  Allergies  Allergen Reactions  . Diovan [Valsartan] Other (See Comments)    Hypotension at low dose, hyperkalemia  . Lipitor [Atorvastatin Calcium] Other (See Comments)    Muscle pain    Patient Measurements: Height: 5\' 7"  (170.2 cm) Weight: 165 lb 5.5 oz (75 kg) IBW/kg (Calculated) : 66.1  Vital Signs: Temp: 98.4 F (36.9 C) (01/16 2211) Temp src: Core (Comment) (01/16 1200) BP: 116/43 mmHg (01/16 2200) Pulse Rate: 79 (01/16 2211)  Labs:  Recent Labs  09/13/13 1349  09/14/13 1445 09/14/13 2040 09/15/13 0520 09/15/13 1940 09/15/13 2000  HGB 9.0*  --   --   --  8.4*  --   --   HCT 29.0*  --   --   --  26.4*  --   --   PLT 264  --   --   --  240  --   --   LABPROT 15.8*  --   --   --   --   --   --   INR 1.29  --   --   --   --   --   --   HEPARINUNFRC  --   --   --  0.18* 0.11*  --  <0.10*  CREATININE 2.07*  < > 1.83*  --  1.86* 1.50*  --   < > = values in this interval not displayed.  Estimated Creatinine Clearance: 42.8 ml/min (by C-G formula based on Cr of 1.5).   Medications:  Infusions:  . sodium chloride 6.5 mL/hr at 09/15/13 1900  . heparin 1,350 Units/hr (09/15/13 1900)  . milrinone 0.377 mcg/kg/min (09/15/13 1900)    Assessment: 73 year old male admitted for LVAD workup.  He has an IABP in place and is receiving anticoagulation with Heparin.  His heparin was off earlier today for colonoscopy and was subsequently resumed.  His heparin level is subtherapeutic.  Goal of Therapy:  Heparin level 0.2-0.5 units/ml Monitor platelets by anticoagulation protocol: Yes   Plan:  Increase Heparin to 1550 units/hr Check 6hr Heparin level and CBC  Legrand Como, Pharm.D., BCPS, AAHIVP Clinical Pharmacist Phone: [phone] or [phone] 09/15/2013, 10:40 PM

## 2013-09-15 NOTE — Progress Notes (Signed)
Dr. Elias Else notified of pt. BP trending down at 0030. Orders received for 250 cc fluid bolus given slowly over 1.5 hours. BP improving. Will continue to monitor.

## 2013-09-15 NOTE — Progress Notes (Signed)
Advanced Heart Failure Rounding Note   Subjective:    [name] is a 73 year old male with history of CAD s/p stent to LCX 2004 with re-occlusion of LCX with lateral MI 2009 and repeat stenting, total occlusion of RCA, 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 S/P L TKR 2013. Last Cath 2009.  He has been followed closely in the HF clinic as well as [place]. On 08/01/13 he was evaluated at [facility] and had decompensated HF. At that time Milrinone was increased to 0.375 mcg. He was evaluated in the HF clinic 12/23 for increased fatigue and later had a RHC on 12/26 which showed increased PVR and elevated filling pressure. Revatio was recommended at time. He returned to the HF clinic 09/11/13 for an acute visit due to increased fatigue and functional decline. He was only able to walk a few yards. Also his renal function has been getting worse. Weight at home 160-162 pounds.   1/15 Swan placement. Done on milrinone 0.375 mcg/kg/min  RA = 9  RV = 53/4/10  PA = 55/24 (34)  PCW = 16  Fick cardiac output/index = 6.1/3.3  Thermo CO/CI = 4.3/2.4  PVR = 4.2 [place]  FA sat = 92%  PA sat = 58%, 60% RA/PCWP= 0.56   Admitted for VAD work up. Swan placed. Yesterday IABP placed due to low CO-OX. Diuresed with IV lasix. 24 hour I/O -2.0. Last night SBP dropped to 80 and he was given 250 cc NS bolus. Weight up 1 pound.   IABP 1:1  Swan this am CVP 10 PAP 62/26 (40) PCWP 24 v waves to 40 Thermo CO 4.8 CI 2.6 RA/PCWP 0.41   CO-OX 60>46>55%  Creatinine 2.07>2.02>1.83>1.86   Objective:   Weight Range:  Vital Signs:   Temp:  [97 F (36.1 C)-98.4 F (36.9 C)] 97.5 F (36.4 C) (01/16 0800) Pulse Rate:  [60-82] 78 (01/16 0900) Resp:  [13-24] 16 (01/16 0800) BP: (80-132)/(38-97) 107/60 mmHg (01/16 0800) SpO2:  [87 %-100 %] 99 % (01/16 0800) Weight:  [75 kg (165 lb 5.5 oz)] 75 kg (165 lb 5.5 oz) (01/16 0500) Last BM  Date: 09/14/13  Weight change: Filed Weights   09/13/13 1300 09/14/13 0434 09/15/13 0500  Weight: 73.4 kg (161 lb 13.1 oz) 74.7 kg (164 lb 10.9 oz) 75 kg (165 lb 5.5 oz)    Intake/Output:   Intake/Output Summary (Last 24 hours) at 09/15/13 0935 Last data filed at 09/15/13 0900  Gross per 24 hour  Intake 1859.14 ml  Output   3375 ml  Net -1515.86 ml     Physical Exam: CVP 9 General: lying in bed. No respiratory difficulty.  HEENT: normal  Neck: supple.Swan in [place]. Carotids 2+ bilat; no bruits. No lymphadenopathy or thryomegaly. RIJ swan appreciated.  Cor: PMI nondisplaced. Regular rate & rhythm. No rubs, gallops 3/6 murmur  Lungs: clear  Abdomen: soft, nontender, nondistended. No hepatosplenomegaly. No bruits or masses. Good bowel sounds.  Extremities: no cyanosis, clubbing, rash, R and LLE trace bilateral ted hose R groin IABP Neuro: alert & oriented x 3, cranial nerves grossly intact. moves all 4 extremities w/o difficulty. Affect pleasant.   ECG: SR 70s  Labs: Basic Metabolic Panel:  Recent Labs Lab 09/11/13 1138 09/13/13 1349 09/14/13 0430 09/14/13 1445 09/15/13 0520  NA 142 141 141 141 138  K 3.2* 4.5 3.9 3.9 3.7  CL 103 104 104 102 101  CO2 25  22 24 25 26   GLUCOSE 109* 101* 92 91 100*  BUN 34* 41* 40* 35* 31*  CREATININE 1.63* 2.07* 2.02* 1.83* 1.86*  CALCIUM 8.6 8.9 8.2* 8.6 7.9*  MG  --  2.0 2.0  --   --     Liver Function Tests:  Recent Labs Lab 09/13/13 1349  AST 116*  ALT 109*  ALKPHOS 162*  BILITOT 0.9  PROT 6.9  ALBUMIN 3.0*   No results found for this basename: LIPASE, AMYLASE,  in the last 168 hours No results found for this basename: AMMONIA,  in the last 168 hours  CBC:  Recent Labs Lab 09/11/13 1138 09/13/13 1349 09/15/13 0520  WBC 6.5 6.8 6.3  NEUTROABS  --  5.3  --   HGB 9.9* 9.0* 8.4*  HCT 31.3* 29.0* 26.4*  MCV 80.9 82.4 81.0  PLT 262 264 240    Cardiac Enzymes: No results found for this basename: CKTOTAL, CKMB,  CKMBINDEX, TROPONINI,  in the last 168 hours  BNP: BNP (last 3 results)  Recent Labs  12/07/12 1200 09/13/13 1349  PROBNP 3310.0* 3883.0*     Other results:   Imaging: Ct Abdomen Pelvis Wo Contrast  09/13/2013   CLINICAL DATA:  73 year old with history of lateral MI in 2009, occluded left circumflex and right coronary arteries, chronic systolic heart failure, presenting with increasing fatigue, dyspnea on exertion, functional decline and worsening renal functions. Patient being evaluated for potential left ventricular assist device.  EXAM: CT CHEST, ABDOMEN AND PELVIS WITHOUT CONTRAST  TECHNIQUE: Multidetector CT imaging of the chest, abdomen and pelvis was performed following the standard protocol without IV contrast. Oral contrast was administered.  COMPARISON:  No prior CT.  FINDINGS: CT CHEST FINDINGS  Heart enlarged with left ventricular enlargement. Pacing defibrillator lead tips in the right atrial appendage and at the right ventricular apex. Severe 3 vessel coronary atherosclerosis. No pericardial effusion. Minimal epicardial fat.  Mild calcified plaque involving the thoracic aorta without evidence of aneurysm. Numerous normal sized mediastinal lymph nodes; no significant lymphadenopathy in the chest.  Mild interstitial opacities and patchy airspace opacities throughout both lungs, consistent with edema, asymmetric and increased in the left upper lobe. Small bilateral pleural effusions, right greater than left. Central airways patent without significant bronchial wall thickening.  Bone window images demonstrate mild diffuse thoracic spondylosis.  CT ABDOMEN AND PELVIS FINDINGS  Diffuse increased attenuation throughout the liver is consistent with given history of Milrinone therapy. No focal parenchymal abnormality involving the liver. Normal unenhanced appearance of the spleen, pancreas, and adrenal glands. Small gallstones within the otherwise normal-appearing gallbladder. No biliary  ductal dilation.  No evidence of hydronephrosis involving either kidney. Within the limits of the unenhanced technique, no focal parenchymal abnormality involving either kidney. Well preserved renal cortex. Very small (1-2 mm) a calculi in mid and lower pole calices of the left kidney without obstruction. No right urinary tract calculi.  Moderate to severe aortoiliofemoral atherosclerosis without aneurysm. Normal size retroperitoneal and mesenteric lymph nodes; no significant lymphadenopathy.  Polypoid lesion involving the anterior wall of the distal esophagus just above the esophagogastric junction. Stomach decompressed and normal in appearance. Normal appearing small bowel. Diffuse colonic diverticulosis, most extensive in the descending and sigmoid colon, without evidence of acute diverticulitis. Normal appendix in the right upper pelvis. No ascites.  Urinary bladder unremarkable. Moderate prostate gland enlargement, particularly the median lobe, the gland measuring approximately 4.5 x 5.6 x 3.9 cm. Normal seminal vesicles.  Bone window images demonstrate facet degenerative  changes throughout the lumbar spine, degenerative disc disease at every lumbar level, severe multifactorial spinal stenosis at L2-3 and moderate multifactorial spinal stenosis at L4-5.  IMPRESSION: CHEST:  1. Cardiomegaly with left ventricular enlargement. Severe 3 vessel coronary atherosclerosis. 2. Interstitial and airspace pulmonary edema and small bilateral pleural effusions consistent with mild CHF and/or fluid overload.  ABDOMEN/PELVIS:  1. No acute abnormalities involving the abdomen or pelvis. 2. Polypoid lesion involving the anterior wall of the distal esophagus just above the esophagogastric junction. One might consider EGD in further evaluation to confirm this finding. 3. Cholelithiasis without evidence of acute cholecystitis. No biliary ductal dilation. 4. Diffuse increased hepatic attenuation consistent with the given history of  Milrinone therapy. No focal hepatic parenchymal abnormality. 5. Non obstructing very small calculi in mid and lower pole calices of the left kidney. No right urinary tract calculi. 6. Diffuse colonic diverticulosis without evidence of acute diverticulitis. 7. Moderate prostate gland enlargement, particularly the median lobe. 8. Degenerative changes throughout the lumbar spine with multifactorial spinal stenosis at L2-3 (severe) and L4-5 (moderate).   Electronically Signed   By: Evangeline Dakin M.D.   On: 09/13/2013 18:31   Ct Chest Wo Contrast  09/13/2013   CLINICAL DATA:  73 year old with history of lateral MI in 2009, occluded left circumflex and right coronary arteries, chronic systolic heart failure, presenting with increasing fatigue, dyspnea on exertion, functional decline and worsening renal functions. Patient being evaluated for potential left ventricular assist device.  EXAM: CT CHEST, ABDOMEN AND PELVIS WITHOUT CONTRAST  TECHNIQUE: Multidetector CT imaging of the chest, abdomen and pelvis was performed following the standard protocol without IV contrast. Oral contrast was administered.  COMPARISON:  No prior CT.  FINDINGS: CT CHEST FINDINGS  Heart enlarged with left ventricular enlargement. Pacing defibrillator lead tips in the right atrial appendage and at the right ventricular apex. Severe 3 vessel coronary atherosclerosis. No pericardial effusion. Minimal epicardial fat.  Mild calcified plaque involving the thoracic aorta without evidence of aneurysm. Numerous normal sized mediastinal lymph nodes; no significant lymphadenopathy in the chest.  Mild interstitial opacities and patchy airspace opacities throughout both lungs, consistent with edema, asymmetric and increased in the left upper lobe. Small bilateral pleural effusions, right greater than left. Central airways patent without significant bronchial wall thickening.  Bone window images demonstrate mild diffuse thoracic spondylosis.  CT ABDOMEN  AND PELVIS FINDINGS  Diffuse increased attenuation throughout the liver is consistent with given history of Milrinone therapy. No focal parenchymal abnormality involving the liver. Normal unenhanced appearance of the spleen, pancreas, and adrenal glands. Small gallstones within the otherwise normal-appearing gallbladder. No biliary ductal dilation.  No evidence of hydronephrosis involving either kidney. Within the limits of the unenhanced technique, no focal parenchymal abnormality involving either kidney. Well preserved renal cortex. Very small (1-2 mm) a calculi in mid and lower pole calices of the left kidney without obstruction. No right urinary tract calculi.  Moderate to severe aortoiliofemoral atherosclerosis without aneurysm. Normal size retroperitoneal and mesenteric lymph nodes; no significant lymphadenopathy.  Polypoid lesion involving the anterior wall of the distal esophagus just above the esophagogastric junction. Stomach decompressed and normal in appearance. Normal appearing small bowel. Diffuse colonic diverticulosis, most extensive in the descending and sigmoid colon, without evidence of acute diverticulitis. Normal appendix in the right upper pelvis. No ascites.  Urinary bladder unremarkable. Moderate prostate gland enlargement, particularly the median lobe, the gland measuring approximately 4.5 x 5.6 x 3.9 cm. Normal seminal vesicles.  Bone window images demonstrate facet  degenerative changes throughout the lumbar spine, degenerative disc disease at every lumbar level, severe multifactorial spinal stenosis at L2-3 and moderate multifactorial spinal stenosis at L4-5.  IMPRESSION: CHEST:  1. Cardiomegaly with left ventricular enlargement. Severe 3 vessel coronary atherosclerosis. 2. Interstitial and airspace pulmonary edema and small bilateral pleural effusions consistent with mild CHF and/or fluid overload.  ABDOMEN/PELVIS:  1. No acute abnormalities involving the abdomen or pelvis. 2. Polypoid  lesion involving the anterior wall of the distal esophagus just above the esophagogastric junction. One might consider EGD in further evaluation to confirm this finding. 3. Cholelithiasis without evidence of acute cholecystitis. No biliary ductal dilation. 4. Diffuse increased hepatic attenuation consistent with the given history of Milrinone therapy. No focal hepatic parenchymal abnormality. 5. Non obstructing very small calculi in mid and lower pole calices of the left kidney. No right urinary tract calculi. 6. Diffuse colonic diverticulosis without evidence of acute diverticulitis. 7. Moderate prostate gland enlargement, particularly the median lobe. 8. Degenerative changes throughout the lumbar spine with multifactorial spinal stenosis at L2-3 (severe) and L4-5 (moderate).   Electronically Signed   By: Evangeline Dakin M.D.   On: 09/13/2013 18:31   Dg Chest Port 1 View  09/14/2013   CLINICAL DATA:  Balloon pump placement  EXAM: PORTABLE CHEST - 1 VIEW  COMPARISON:  CT CHEST W/O CM dated 09/13/2013  FINDINGS: There is a Swan-Ganz catheter with the tip within the right lower lobe pulmonary artery segment. There is a 3 lead cardiac pacer. There is interval placement of an intra-aortic balloon pump with the metallic tip at the distal aspect of the aortic arch. The lungs are clear. No focal consolidation, pleural effusion or pneumothorax. Mild cardiomegaly. Unremarkable osseous structures.  IMPRESSION: 1. Intra-aortic balloon pump with the metallic tip at the distal aspect of the aortic arch. 2. Swan-Ganz catheter with the tip within the right lower lobe pulmonary artery segment.   Electronically Signed   By: Kathreen Devoid   On: 09/14/2013 15:08     Medications:     Scheduled Medications: . alteplase  4 mg Intracatheter Once  . ezetimibe  10 mg Oral Daily  . levothyroxine  150 mcg Oral Custom   And  . levothyroxine  75 mcg Oral Custom  . sildenafil  20 mg Oral TID  . sodium chloride  3 mL Intravenous  Q12H  . sodium chloride  3 mL Intravenous Q12H    Infusions: . sodium chloride 1.6 mL/hr at 09/15/13 0900  . heparin 1,350 Units/hr (09/15/13 0900)  . milrinone 0.377 mcg/kg/min (09/15/13 0900)    PRN Medications: sodium chloride, sodium chloride, acetaminophen, ondansetron (ZOFRAN) IV, sodium chloride, sodium chloride, zolpidem   Assessment:   1. A/C Systolic Heart Failure ECHO EF 20-25% INTERMACS 2 on transplant list [place]  2. Hypothyroid  3.CAD s/p stent to LCX 2004 with re-occlusion of LCX with lateral MI 2009 and repeat stenting with a Promus, total occlusion of RCA  4. S/P ICD 2009  5. Hyperlipidemia  6. Arthritis S/P TKR 2013  7. Blood Type O negative  8. PVCs  9.Esophageial Polyp 10. Anemia 11. Moderate to severe MR    Plan/Discussion:    Hemodynamics much improved on IABP and milrinone but renal function plateaued. PCPW of 24 probably ok for him given degree of MR. Will be gentle with diuresis. Hopefully renal function will continue to improve.   Plan for EGD today and esophageal biopsy this afternoon.  If we can optimize, will plan VAD early next  week. SW to perform assessment for VAD work up.   Watch H/H closely. Will order type and screen for am as he may need some blood.    The patient is critically ill with multiple organ systems failure and requires high complexity decision making for assessment and support, frequent evaluation and titration of therapies, application of advanced monitoring technologies and extensive interpretation of multiple databases.   Critical Care Time devoted to patient care services described in this note is 40 Minutes.  Length of Stay: 2  Benay Spice 9:53 AM  09/15/2013, 9:35 AM Advanced Heart Failure Team Pager [phone] (M-F; 7a - 4p)  Please contact [facility] for night-coverage after hours (4p -7a ) and weekends on [email or web address]

## 2013-09-15 NOTE — Progress Notes (Signed)
ANTICOAGULATION CONSULT NOTE - Follow Up Consult  Pharmacy Consult for heparin Indication: IABP  Labs:  Recent Labs  09/13/13 1349 09/14/13 0430 09/14/13 1445 09/14/13 2040 09/15/13 0520  HGB 9.0*  --   --   --  8.4*  HCT 29.0*  --   --   --  26.4*  PLT 264  --   --   --  240  LABPROT 15.8*  --   --   --   --   INR 1.29  --   --   --   --   HEPARINUNFRC  --   --   --  0.18* 0.11*  CREATININE 2.07* 2.02* 1.83*  --  1.86*     Assessment: 73yo male now with lower heparin level despite rate increase, no gtt issue per RN.  Goal of Therapy:  Heparin level 0.2-0.5 units/ml   Plan:  Will increase heparin gtt by 2 units/kg/hr to 1350 units/hr and check level in [place], PharmD, BCPS  09/15/2013,6:29 AM

## 2013-09-15 NOTE — Progress Notes (Signed)
1 Day Post-Op Procedure(s) (LRB): INTRA-AORTIC BALLOON PUMP INSERTION (N/A) Subjective: Acute on chronic HF Hx of ischemic CM- now decompensated on IABP Creat 1.9 CXR clear Possible implantable VAD next week if renal function improves  Objective: Vital signs in last 24 hours: Temp:  [97 F (36.1 C)-98.6 F (37 C)] 98.6 F (37 C) (01/16 1200) Pulse Rate:  [60-78] 77 (01/16 1200) Cardiac Rhythm:  [-] Heart block (01/16 0800) Resp:  [13-24] 22 (01/16 1200) BP: (80-133)/(38-97) 120/57 mmHg (01/16 1200) SpO2:  [87 %-100 %] 96 % (01/16 1200) Weight:  [165 lb 5.5 oz (75 kg)] 165 lb 5.5 oz (75 kg) (01/16 0500)  Hemodynamic parameters for last 24 hours: PAP: (25-64)/(11-32) 56/26 mmHg CVP:  [9 mmHg-22 mmHg] 10 mmHg PCWP:  [24 mmHg-38 mmHg] 24 mmHg CO:  [4.9 L/min-5.7 L/min] 4.9 L/min CI:  [2.7 L/min/m2-3.1 L/min/m2] 2.7 L/min/m2  Intake/Output from previous day: 01/15 0701 - 01/16 0700 In: 1956.6 [P.O.:960; I.V.:746.7; IV Piggyback:250] Out: [id number] [Urine:4025] Intake/Output this shift: Total I/O In: 315.1 [P.O.:120; I.V.:195.1] Out: 300 [Urine:300]  Lungs clear abd soft  Lab Results:  Recent Labs  09/13/13 1349 09/15/13 0520  WBC 6.8 6.3  HGB 9.0* 8.4*  HCT 29.0* 26.4*  PLT 264 240   BMET:  Recent Labs  09/14/13 1445 09/15/13 0520  NA 141 138  K 3.9 3.7  CL 102 101  CO2 25 26  GLUCOSE 91 100*  BUN 35* 31*  CREATININE 1.83* 1.86*  CALCIUM 8.6 7.9*    PT/INR:  Recent Labs  09/13/13 1349  LABPROT 15.8*  INR 1.29   ABG    Component Value Date/Time   PHART 7.517* 09/14/2013 0327   HCO3 23.0 09/14/2013 0327   TCO2 23.8 09/14/2013 0327   ACIDBASEDEF 1.0 09/13/2013 1745   O2SAT 55.5 09/15/2013 0500   CBG (last 3)   Recent Labs  09/15/13 1159  GLUCAP 94    Assessment/Plan: S/P Procedure(s) (LRB): INTRA-AORTIC BALLOON PUMP INSERTION (N/A)   Upper endoscopy planned today for esophageal polypoid mass at [place]   LOS: 2 days    VAN TRIGT  III,PETER 09/15/2013

## 2013-09-16 ENCOUNTER — Inpatient Hospital Stay (HOSPITAL_COMMUNITY): Payer: Medicare Other

## 2013-09-16 LAB — URINALYSIS, ROUTINE W REFLEX MICROSCOPIC
Bilirubin Urine: NEGATIVE
Glucose, UA: NEGATIVE mg/dL
Hgb urine dipstick: NEGATIVE
Ketones, ur: NEGATIVE mg/dL
Leukocytes, UA: NEGATIVE
Nitrite: NEGATIVE
Protein, ur: NEGATIVE mg/dL
Specific Gravity, Urine: 1.008 (ref 1.005–1.030)
Urobilinogen, UA: 1 mg/dL (ref 0.0–1.0)
pH: 7 (ref 5.0–8.0)

## 2013-09-16 LAB — CARBOXYHEMOGLOBIN
Carboxyhemoglobin: 1.6 % — ABNORMAL HIGH (ref 0.5–1.5)
Methemoglobin: 0.9 % (ref 0.0–1.5)
O2 Saturation: 54.7 %
Total hemoglobin: 9.1 g/dL — ABNORMAL LOW (ref 13.5–18.0)

## 2013-09-16 LAB — HEPARIN LEVEL (UNFRACTIONATED)
Heparin Unfractionated: 0.19 IU/mL — ABNORMAL LOW (ref 0.30–0.70)
Heparin Unfractionated: 0.26 IU/mL — ABNORMAL LOW (ref 0.30–0.70)

## 2013-09-16 LAB — CBC
HCT: 28.7 % — ABNORMAL LOW (ref 39.0–52.0)
Hemoglobin: 8.7 g/dL — ABNORMAL LOW (ref 13.0–17.0)
MCH: 25 pg — ABNORMAL LOW (ref 26.0–34.0)
MCHC: 30.3 g/dL (ref 30.0–36.0)
MCV: 82.5 fL (ref 78.0–100.0)
Platelets: 218 10*3/uL (ref 150–400)
RBC: 3.48 MIL/uL — ABNORMAL LOW (ref 4.22–5.81)
RDW: 18.1 % — ABNORMAL HIGH (ref 11.5–15.5)
WBC: 8.3 10*3/uL (ref 4.0–10.5)

## 2013-09-16 LAB — BASIC METABOLIC PANEL
BUN: 23 mg/dL (ref 6–23)
CO2: 26 mEq/L (ref 19–32)
Calcium: 8.1 mg/dL — ABNORMAL LOW (ref 8.4–10.5)
Chloride: 102 mEq/L (ref 96–112)
Creatinine, Ser: 1.41 mg/dL — ABNORMAL HIGH (ref 0.50–1.35)
GFR calc Af Amer: 57 mL/min — ABNORMAL LOW (ref >90)
GFR calc non Af Amer: 49 mL/min — ABNORMAL LOW (ref >90)
Glucose, Bld: 128 mg/dL — ABNORMAL HIGH (ref 70–99)
Potassium: 3.7 mEq/L (ref 3.7–5.3)
Sodium: 138 mEq/L (ref 137–147)

## 2013-09-16 MED ORDER — SODIUM CHLORIDE 0.9 % IV SOLN
Freq: Once | INTRAVENOUS | Status: AC
  Administered 2013-09-16: 23:00:00 via INTRAVENOUS

## 2013-09-16 MED ORDER — FUROSEMIDE 10 MG/ML IJ SOLN
40.0000 mg | Freq: Once | INTRAMUSCULAR | Status: AC
  Administered 2013-09-16: 40 mg via INTRAVENOUS

## 2013-09-16 MED ORDER — RIFAMPIN 300 MG PO CAPS
600.0000 mg | ORAL_CAPSULE | Freq: Once | ORAL | Status: AC
  Administered 2013-09-18: 600 mg via ORAL
  Filled 2013-09-16: qty 2

## 2013-09-16 MED ORDER — FUROSEMIDE 10 MG/ML IJ SOLN
40.0000 mg | Freq: Every day | INTRAMUSCULAR | Status: DC
  Administered 2013-09-16 – 2013-09-17 (×2): 40 mg via INTRAVENOUS
  Filled 2013-09-16 (×3): qty 4

## 2013-09-16 MED ORDER — VANCOMYCIN HCL 1000 MG IV SOLR
750.0000 mg | Freq: Two times a day (BID) | INTRAVENOUS | Status: DC
  Administered 2013-09-16 – 2013-09-19 (×6): 750 mg via INTRAVENOUS
  Filled 2013-09-16 (×10): qty 750

## 2013-09-16 MED ORDER — POTASSIUM CHLORIDE ER 10 MEQ PO TBCR
20.0000 meq | EXTENDED_RELEASE_TABLET | Freq: Every day | ORAL | Status: DC
  Administered 2013-09-16 – 2013-09-17 (×2): 20 meq via ORAL
  Filled 2013-09-16 (×4): qty 2

## 2013-09-16 MED ORDER — ALPRAZOLAM 0.25 MG PO TABS
0.2500 mg | ORAL_TABLET | ORAL | Status: DC | PRN
  Administered 2013-09-16: 0.25 mg via ORAL
  Administered 2013-09-18: 0.5 mg via ORAL
  Filled 2013-09-16: qty 2
  Filled 2013-09-16: qty 1

## 2013-09-16 MED ORDER — PIPERACILLIN-TAZOBACTAM 3.375 G IVPB 30 MIN
3.3750 g | Freq: Once | INTRAVENOUS | Status: AC
  Administered 2013-09-16: 3.375 g via INTRAVENOUS
  Filled 2013-09-16: qty 50

## 2013-09-16 MED ORDER — PIPERACILLIN-TAZOBACTAM 3.375 G IVPB
3.3750 g | Freq: Three times a day (TID) | INTRAVENOUS | Status: DC
  Administered 2013-09-17 – 2013-09-23 (×18): 3.375 g via INTRAVENOUS
  Filled 2013-09-16 (×22): qty 50

## 2013-09-16 MED ORDER — GUAIFENESIN-DM 100-10 MG/5ML PO SYRP: 5.0000 mL | ORAL_SOLUTION | ORAL | Status: DC | PRN

## 2013-09-16 MED ORDER — VANCOMYCIN HCL 1000 MG IV SOLR
750.0000 mg | Freq: Two times a day (BID) | INTRAVENOUS | Status: DC
  Filled 2013-09-16 (×2): qty 750

## 2013-09-16 NOTE — Progress Notes (Signed)
ANTICOAGULATION CONSULT NOTE - Follow Up Consult  Pharmacy Consult for heparin Indication: IABP  Labs:  Recent Labs  09/15/13 0520 09/15/13 1940 09/15/13 2000 09/16/13 0542 09/16/13 1400  HGB 8.4*  --   --  8.7*  --   HCT 26.4*  --   --  28.7*  --   PLT 240  --   --  218  --   HEPARINUNFRC 0.11*  --  <0.10* 0.19* 0.26*  CREATININE 1.86* 1.50*  --  1.41*  --      Assessment: 73yo male now with therapeutic heparin level for IABP.  No bleeding noted.  Goal of Therapy:  Heparin level 0.2-0.5 units/ml   Plan:  1. Continue IV heparin at current rate. 2. Recheck heparin level in 6 hrs to confirm. 3. Daily heparin level and CBC.  Uvaldo Rising, BCPS  Clinical Pharmacist Pager [phone]  09/16/2013 4:38 PM

## 2013-09-16 NOTE — Progress Notes (Signed)
ANTICOAGULATION CONSULT NOTE - Follow Up Consult  Pharmacy Consult for heparin Indication: IABP  Labs:  Recent Labs  09/13/13 1349  09/14/13 1445  09/15/13 0520 09/15/13 1940 09/15/13 2000 09/16/13 0542  HGB 9.0*  --   --   --  8.4*  --   --   --   HCT 29.0*  --   --   --  26.4*  --   --   --   PLT 264  --   --   --  240  --   --   --   LABPROT 15.8*  --   --   --   --   --   --   --   INR 1.29  --   --   --   --   --   --   --   HEPARINUNFRC  --   --   --   < > 0.11*  --  <0.10* 0.19*  CREATININE 2.07*  < > 1.83*  --  1.86* 1.50*  --   --   < > = values in this interval not displayed.   Assessment: 73yo male now with slightly subtherapeutic heparin level after resumed, no gtt issue per RN.  Goal of Therapy:  Heparin level 0.2-0.5 units/ml   Plan:  Will increase heparin gtt slightly to 1600 units/hr and check level in [place], PharmD, BCPS  09/16/2013,6:23 AM

## 2013-09-16 NOTE — Consult Note (Signed)
[place] for :   Vancomycin Indication:  R/O Sepsis  Hospital Problems: Active Problems:   Acute on chronic systolic heart failure   Acute on chronic renal failure   Nonspecific (abnormal) findings on radiological and other examination of gastrointestinal tract   Anemia, unspecified   Palliative care encounter   Weakness generalized   Allergies: Allergies  Allergen Reactions  . Diovan [Valsartan] Other (See Comments)    Hypotension at low dose, hyperkalemia  . Lipitor [Atorvastatin Calcium] Other (See Comments)    Muscle pain    Patient Measurements: Height: 5\' 7"  (170.2 cm) Weight: 160 lb 0.9 oz (72.6 kg) IBW/kg (Calculated) : 66.1  Vital Signs: BP 113/58  Pulse 85  Temp(Src) 100.8 F (38.2 C) (Core (Comment))  Resp 14  Ht 5\' 7"  (1.702 m)  Wt 160 lb 0.9 oz (72.6 kg)  BMI 25.06 kg/m2  SpO2 98%  Labs:  Recent Labs  09/15/13 0520 09/15/13 1940 09/16/13 0542  WBC 6.3  --  8.3  HGB 8.4*  --  8.7*  PLT 240  --  218  CREATININE 1.86* 1.50* 1.41*   Estimated Creatinine Clearance: 45.6 ml/min (by C-G formula based on Cr of 1.41).   Microbiology: Recent Results (from the past 720 hour(s))  SURGICAL PCR SCREEN     Status: None   Collection Time    09/13/13  1:29 PM      Result Value Range Status   MRSA, PCR NEGATIVE  NEGATIVE Final   Staphylococcus aureus NEGATIVE  NEGATIVE Final  RESPIRATORY VIRUS PANEL     Status: Abnormal   Collection Time    09/13/13 11:48 PM      Result Value Range Status   Source - [facility] NASAL SWAB   Corrected   Comment: CORRECTED ON 01/16 AT 2216: PREVIOUSLY REPORTED AS NASAL SWAB   Respiratory Syncytial Virus A NOT DETECTED   Final   Respiratory Syncytial Virus B NOT DETECTED   Final   Influenza A DETECTED (*)  Final   Influenza B NOT DETECTED   Final   Parainfluenza 1 NOT DETECTED   Final   Parainfluenza 2 NOT DETECTED   Final   Parainfluenza 3 NOT DETECTED   Final   Metapneumovirus  DETECTED (*)  Final   Rhinovirus NOT DETECTED   Final   Adenovirus NOT DETECTED   Final   Influenza A H1 NOT DETECTED   Final   Influenza A H3 NOT DETECTED   Final    Medical/Surgical History: Past Medical History  Diagnosis Date  . Ischemic cardiomyopathy     a. 10/09 Echo: Sev LV Dysfxn, inf/lat AK, Mod MR.  [name];  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 Hypertension   . PVC (premature ventricular contraction)   . Pacemaker   . CHF (congestive heart failure)   . Shortness of breath   . Automatic implantable cardioverter-defibrillator in situ    Past Surgical History  Procedure Laterality Date  . Defibrillator  2009  .  Total knee arthroplasty  11/27/11    left  . Insert / replace / remove pacemaker  2009  . Coronary angioplasty with stent placement  2004  . Coronary angioplasty with stent placement  07/2008  . Knee arthroscopy w/ partial medial meniscectomy  12/2002    left  . Knee arthroscopy      bilaterally  . Total knee arthroplasty  11/27/2011    Procedure: TOTAL KNEE ARTHROPLASTY;  Surgeon: Yvette Rack., MD;  Location: [facility];  Service: Orthopedics;  Laterality: Left;  left total knee arthroplasty    Medications:  Prescriptions prior to admission  Medication Sig Dispense Refill  . amiodarone (PACERONE) 200 MG tablet Take 1 tablet (200 mg total) by mouth 2 (two) times daily.  60 tablet  6  . aspirin 325 MG tablet Take 325 mg by mouth daily.        . carvedilol (COREG) 3.125 MG tablet Take 1 tablet (3.125 mg total) by mouth 2 (two) times daily with a meal.  60 tablet  6  . Coenzyme Q10 (CO Q-10 PO) Take 1 capsule by mouth 2  (two) times daily.       [name] ezetimibe (ZETIA) 10 MG tablet Take 10 mg by mouth daily.      . furosemide (LASIX) 20 MG tablet Take 20 mg by mouth daily as needed (for 3 lbs or more weight gain per day or 5 lbs or more weight gain per week or as instructed by Dr. Tempie Hoist.).      [name] hydrALAZINE (APRESOLINE) 25 MG tablet Take 1 tablet (25 mg total) by mouth 3 (three) times daily.  90 tablet  3  . isosorbide mononitrate (IMDUR) 30 MG 24 hr tablet Take 0.5 tablets (15 mg total) by mouth daily.  45 tablet  3  . levothyroxine (SYNTHROID, LEVOTHROID) 150 MCG tablet 75-150 mcg daily before breakfast. Take 1 tablet (150 mcg) daily on an empty stomach except take 1/2 tablet (75 mcg) every Friday as directed.      . milrinone (PRIMACOR) 20 MG/100ML SOLN infusion Inject 0.375 mcg/kg/min into the vein continuous.      . Omega-3 300 MG CAPS Take 300 mg by mouth daily.       . potassium chloride SA (K-DUR,KLOR-CON) 20 MEQ tablet Take 1 tablet (20 mEq total) by mouth as needed. Take only when you take Furosemide  30 tablet  3  . rosuvastatin (CRESTOR) 20 MG tablet Take 20 mg by mouth daily.      . nitroGLYCERIN (NITROSTAT) 0.4 MG SL tablet Place 0.4 mg under the tongue every 5 (five) minutes as needed. For chest pain       Scheduled:  . ezetimibe  10 mg Oral Daily  . furosemide  40 mg Intravenous Daily  . levothyroxine  150 mcg Oral Custom   And  . levothyroxine  75 mcg Oral Custom  . pantoprazole  40 mg Oral Daily  . potassium chloride  20 mEq Oral Daily  . [START ON 09/18/2013] rifampin  600 mg Oral Once  . sildenafil  20 mg Oral TID  . sodium chloride  3 mL Intravenous Q12H  . vancomycin (VANCOCIN) 750 mg IVPB  750 mg Intravenous Q12H   Anti-infectives   Start     Dose/Rate Route Frequency Ordered Stop   09/18/13 0500  rifampin (RIFADIN) capsule 600 mg     600 mg Oral  Once 09/16/13 1308     09/16/13 1900  vancomycin (VANCOCIN) 750 mg in  sodium chloride 0.9 % 150 mL IVPB     750 mg 150 mL/hr over 60  Minutes Intravenous Every 12 hours 09/16/13 1844        Assessment:  73 y/o male admitted for VAD work up. S/P IABP placement due to low CO-OX and worsening renal function  Patient has developed fever and is showing signs of possible sepsis.  He is scheduled for LVAD early next week.  Vancomycin has been ordered for presumed sepsis.  CrCl 46 ml/min.  WBC trending up, 8.3 K.  100.8 F .  Goal of Therapy:   Vancomycin trough level 15-20 mcg/ml Vancomycin to be selected for presumed infection/cultures and adjusted for renal function.   Plan:  1. Vancomycin 750 mg IV q 12 hours. 2. Follow up SCr, UOP, cultures, clinical course and adjust as clinically indicated. 3. Vancomycin levels as indicated.  Zakkiyya Barno, Craig Guess,  Pharm.D.,    1/17/20156:47 PM

## 2013-09-16 NOTE — Progress Notes (Signed)
73yo male to add Zosyn to empiric ABX for possible sepsis.  Will start Zosyn 3.375g IV Q8H for CrCl ~45 ml/min and monitor CBC, CrCl, clinical progression.  Wynona Neat, PharmD, BCPS 09/16/2013 10:43 PM

## 2013-09-16 NOTE — Progress Notes (Signed)
Advanced Heart Failure Rounding Note   Subjective:    Mr Greenhut is a 74 year old male with history of CAD s/p stent to LCX 2004 with re-occlusion of LCX with lateral MI 2009 and repeat stenting, total occlusion of RCA, chronic systolic heart failure EF 20% on chronic Milrinone at 0.375 mcg, ICD 2009, PVCs, hypothyroidism, hyperlipidemia, and arthritis. S/P L TKR 2013.   Admitted for VAD work up. Swan placed. IABP placed due to low CO-OX and worsening renal function.  IABP 1:1  EGD yesterday was ok. Feels good. No CP or SOB. Renal function continues to improve. Cr now 1.4  Swan this am CVP 11 PAP 59/28 (42) PCWP 24 v waves to 40   CO-OX 60>46>55% >55% Creatinine 2.07>2.02>1.83>1.86> 1.4  CXR 1/17: IABP ok. Edema much improved  Objective:   Weight Range:  Vital Signs:   Temp:  [97 F (36.1 C)-99 F (37.2 C)] 98.6 F (37 C) (01/17 1200) Pulse Rate:  [65-195] 195 (01/17 1200) Resp:  [13-20] 16 (01/17 1200) BP: (86-145)/(36-89) 121/89 mmHg (01/17 1200) SpO2:  [92 %-100 %] 96 % (01/17 1200) Weight:  [72.6 kg (160 lb 0.9 oz)] 72.6 kg (160 lb 0.9 oz) (01/17 0500) Last BM Date: 09/14/13  Weight change: Filed Weights   09/14/13 0434 09/15/13 0500 09/16/13 0500  Weight: 74.7 kg (164 lb 10.9 oz) 75 kg (165 lb 5.5 oz) 72.6 kg (160 lb 0.9 oz)    Intake/Output:   Intake/Output Summary (Last 24 hours) at 09/16/13 1223 Last data filed at 09/16/13 1100  Gross per 24 hour  Intake 1799.94 ml  Output   1200 ml  Net 599.94 ml     Physical Exam: CVP 9 General: lying in bed. No respiratory difficulty.  HEENT: normal  Neck: supple.Swan in [place]. Carotids 2+ bilat; no bruits. No lymphadenopathy or thryomegaly. RIJ swan appreciated.  Cor: PMI nondisplaced. Regular rate & rhythm. No rubs, gallops 3/6 murmur  Lungs: clear  Abdomen: soft, nontender, nondistended. No hepatosplenomegaly. No bruits or masses. Good bowel sounds.  Extremities: no cyanosis, clubbing, rash, R and LLE trace  bilateral ted hose R groin IABP Neuro: alert & oriented x 3, cranial nerves grossly intact. moves all 4 extremities w/o difficulty. Affect pleasant.   ECG: SR 70s  Labs: Basic Metabolic Panel:  Recent Labs Lab 09/13/13 1349 09/14/13 0430 09/14/13 1445 09/15/13 0520 09/15/13 1940 09/16/13 0542  NA 141 141 141 138 140 138  K 4.5 3.9 3.9 3.7 3.6* 3.7  CL 104 104 102 101 104 102  CO2 22 24 25 26 25 26   GLUCOSE 101* 92 91 100* 160* 128*  BUN 41* 40* 35* 31* 25* 23  CREATININE 2.07* 2.02* 1.83* 1.86* 1.50* 1.41*  CALCIUM 8.9 8.2* 8.6 7.9* 8.3* 8.1*  MG 2.0 2.0  --   --   --   --     Liver Function Tests:  Recent Labs Lab 09/13/13 1349  AST 116*  ALT 109*  ALKPHOS 162*  BILITOT 0.9  PROT 6.9  ALBUMIN 3.0*   No results found for this basename: LIPASE, AMYLASE,  in the last 168 hours No results found for this basename: AMMONIA,  in the last 168 hours  CBC:  Recent Labs Lab 09/11/13 1138 09/13/13 1349 09/15/13 0520 09/16/13 0542  WBC 6.5 6.8 6.3 8.3  NEUTROABS  --  5.3  --   --   HGB 9.9* 9.0* 8.4* 8.7*  HCT 31.3* 29.0* 26.4* 28.7*  MCV 80.9 82.4 81.0 82.5  PLT 262 264 240 218    Cardiac Enzymes: No results found for this basename: CKTOTAL, CKMB, CKMBINDEX, TROPONINI,  in the last 168 hours  BNP: BNP (last 3 results)  Recent Labs  12/07/12 1200 09/13/13 1349  PROBNP 3310.0* 3883.0*     Other results:   Imaging: Dg Chest 1 View  09/15/2013   CLINICAL DATA:  Intra-aortic balloon pump. Evaluation for ventricular assist device.  EXAM: CHEST - 1 VIEW  COMPARISON:  DG CHEST 1V PORT dated 09/14/2013  FINDINGS: Intra-aortic balloon pump marker is just inferior to the superior aspect of the aortic arch (13 mm). Right IJ vascular sheath transmitting a Swan-Ganz catheter with the tip in the right pulmonary artery. Compared to yesterday's exam, the pulmonary edema has increased, he than 1 making allowances for underpenetration on today's study. The  cardiopericardial silhouette remains enlarged. AICD apparatus unchanged.  IMPRESSION: 1. Support apparatus in satisfactory position. Swan-Ganz catheter retracted to the proximal right pulmonary artery. 2. Intra-aortic balloon pump tip 13 mm from the superior aspect of the aortic arch. 3. Increasing pulmonary edema/CHF.   Electronically Signed   By: Dereck Ligas M.D.   On: 09/15/2013 10:54   Dg Chest Port 1 View  09/16/2013   CLINICAL DATA:  Followup congestive heart failure. Intra-aortic balloon pump.  EXAM: PORTABLE CHEST - 1 VIEW  COMPARISON:  09/15/2013  FINDINGS: Support lines and tubes in appropriate position. Decreased bilateral airspace disease is seen, consistent decreased pulmonary edema. Cardiomegaly is stable. No evidence of pneumothorax or pleural effusion.  IMPRESSION: Interval decrease in diffuse pulmonary edema.  Stable cardiomegaly.   Electronically Signed   By: Earle Gell M.D.   On: 09/16/2013 08:09   Dg Chest Port 1 View  09/14/2013   CLINICAL DATA:  Balloon pump placement  EXAM: PORTABLE CHEST - 1 VIEW  COMPARISON:  CT CHEST W/O CM dated 09/13/2013  FINDINGS: There is a Swan-Ganz catheter with the tip within the right lower lobe pulmonary artery segment. There is a 3 lead cardiac pacer. There is interval placement of an intra-aortic balloon pump with the metallic tip at the distal aspect of the aortic arch. The lungs are clear. No focal consolidation, pleural effusion or pneumothorax. Mild cardiomegaly. Unremarkable osseous structures.  IMPRESSION: 1. Intra-aortic balloon pump with the metallic tip at the distal aspect of the aortic arch. 2. Swan-Ganz catheter with the tip within the right lower lobe pulmonary artery segment.   Electronically Signed   By: Kathreen Devoid   On: 09/14/2013 15:08     Medications:     Scheduled Medications: . ezetimibe  10 mg Oral Daily  . levothyroxine  150 mcg Oral Custom   And  . levothyroxine  75 mcg Oral Custom  . pantoprazole  40 mg Oral Daily   . sildenafil  20 mg Oral TID  . sodium chloride  3 mL Intravenous Q12H    Infusions: . sodium chloride 6.5 mL/hr at 09/16/13 1100  . heparin 1,600 Units/hr (09/16/13 1100)  . milrinone 0.377 mcg/kg/min (09/16/13 1100)    PRN Medications: sodium chloride, acetaminophen, ondansetron (ZOFRAN) IV, sodium chloride, zolpidem   Assessment:   1. A/C Systolic Heart Failure ECHO EF 20-25% INTERMACS 2 on transplant list [place]  2. Hypothyroid  3.CAD s/p stent to LCX 2004 with re-occlusion of LCX with lateral MI 2009 and repeat stenting with a Promus, total occlusion of RCA  4. S/P ICD 2009  5. Hyperlipidemia  6. Arthritis S/P TKR 2013  7. Blood Type  O negative  8. PVCs  9.Esophageial Polyp 10. Anemia 11. Moderate to severe MR    Plan/Discussion:    Much improved on IABP and milrinone. Renal function improved - now at baseline. PCWP of 24 probably ok for him given degree of MR. Will be gentle with diuresis.   Plan VAD and possible TVR on Monday am.   The patient is critically ill with multiple organ systems failure and requires high complexity decision making for assessment and support, frequent evaluation and titration of therapies, application of advanced monitoring technologies and extensive interpretation of multiple databases.   Critical Care Time devoted to patient care services described in this note is 35 Minutes.  Length of Stay: 3  Benay Spice 12:23 PM  09/16/2013, 12:23 PM Advanced Heart Failure Team Pager [phone] (M-F; 7a - 4p)  Please contact [facility] for night-coverage after hours (4p -7a ) and weekends on [email or web address]

## 2013-09-17 ENCOUNTER — Inpatient Hospital Stay (HOSPITAL_COMMUNITY): Payer: Medicare Other

## 2013-09-17 DIAGNOSIS — R509 Fever, unspecified: Secondary | ICD-10-CM

## 2013-09-17 LAB — CBC
HCT: 29.3 % — ABNORMAL LOW (ref 39.0–52.0)
Hemoglobin: 9.2 g/dL — ABNORMAL LOW (ref 13.0–17.0)
MCH: 25.2 pg — ABNORMAL LOW (ref 26.0–34.0)
MCHC: 31.4 g/dL (ref 30.0–36.0)
MCV: 80.3 fL (ref 78.0–100.0)
Platelets: 254 10*3/uL (ref 150–400)
RBC: 3.65 MIL/uL — ABNORMAL LOW (ref 4.22–5.81)
RDW: 18.1 % — ABNORMAL HIGH (ref 11.5–15.5)
WBC: 11.4 10*3/uL — ABNORMAL HIGH (ref 4.0–10.5)

## 2013-09-17 LAB — BASIC METABOLIC PANEL
BUN: 21 mg/dL (ref 6–23)
CO2: 24 mEq/L (ref 19–32)
Calcium: 8.2 mg/dL — ABNORMAL LOW (ref 8.4–10.5)
Chloride: 98 mEq/L (ref 96–112)
Creatinine, Ser: 1.49 mg/dL — ABNORMAL HIGH (ref 0.50–1.35)
GFR calc Af Amer: 53 mL/min — ABNORMAL LOW (ref >90)
GFR calc non Af Amer: 46 mL/min — ABNORMAL LOW (ref >90)
Glucose, Bld: 158 mg/dL — ABNORMAL HIGH (ref 70–99)
Potassium: 3.3 mEq/L — ABNORMAL LOW (ref 3.7–5.3)
Sodium: 136 mEq/L — ABNORMAL LOW (ref 137–147)

## 2013-09-17 LAB — HEPARIN LEVEL (UNFRACTIONATED)
Heparin Unfractionated: 0.28 IU/mL — ABNORMAL LOW (ref 0.30–0.70)
Heparin Unfractionated: 0.28 IU/mL — ABNORMAL LOW (ref 0.30–0.70)

## 2013-09-17 LAB — PREPARE RBC (CROSSMATCH)

## 2013-09-17 MED ORDER — MILRINONE IN DEXTROSE 20 MG/100ML IV SOLN
0.3000 ug/kg/min | INTRAVENOUS | Status: DC
  Filled 2013-09-17: qty 100

## 2013-09-17 MED ORDER — DIAZEPAM 5 MG PO TABS
5.0000 mg | ORAL_TABLET | Freq: Once | ORAL | Status: AC
  Administered 2013-09-18: 5 mg via ORAL
  Filled 2013-09-17: qty 1

## 2013-09-17 MED ORDER — POTASSIUM CHLORIDE 2 MEQ/ML IV SOLN
80.0000 meq | INTRAVENOUS | Status: DC
  Filled 2013-09-17: qty 40

## 2013-09-17 MED ORDER — VANCOMYCIN HCL 10 G IV SOLR
1250.0000 mg | INTRAVENOUS | Status: AC
  Administered 2013-09-18: 1250 mg via INTRAVENOUS
  Filled 2013-09-17: qty 1250

## 2013-09-17 MED ORDER — VANCOMYCIN HCL 1000 MG IV SOLR
1000.0000 mg | INTRAVENOUS | Status: AC
  Administered 2013-09-18: 1000 mg
  Filled 2013-09-17 (×2): qty 1000

## 2013-09-17 MED ORDER — MAGNESIUM SULFATE 50 % IJ SOLN
40.0000 meq | INTRAMUSCULAR | Status: DC
  Filled 2013-09-17: qty 10

## 2013-09-17 MED ORDER — NOREPINEPHRINE BITARTRATE 1 MG/ML IJ SOLN
0.0000 ug/min | INTRAVENOUS | Status: DC
  Filled 2013-09-17 (×2): qty 8

## 2013-09-17 MED ORDER — DOPAMINE-DEXTROSE 3.2-5 MG/ML-% IV SOLN
0.0000 ug/kg/min | INTRAVENOUS | Status: AC
  Administered 2013-09-18: 3 ug/kg/min via INTRAVENOUS
  Filled 2013-09-17: qty 250

## 2013-09-17 MED ORDER — NOREPINEPHRINE BITARTRATE 1 MG/ML IJ SOLN
2.0000 ug/min | INTRAVENOUS | Status: DC
  Administered 2013-09-17: 4 ug/min via INTRAVENOUS
  Administered 2013-09-18: 5 ug/min via INTRAVENOUS
  Filled 2013-09-17 (×3): qty 8

## 2013-09-17 MED ORDER — SODIUM CHLORIDE 0.9 % IV SOLN
Freq: Once | INTRAVENOUS | Status: AC
  Administered 2013-09-17: 01:00:00 via INTRAVENOUS

## 2013-09-17 MED ORDER — CHLORHEXIDINE GLUCONATE CLOTH 2 % EX PADS
6.0000 | MEDICATED_PAD | Freq: Once | CUTANEOUS | Status: AC
  Administered 2013-09-17: 6 via TOPICAL

## 2013-09-17 MED ORDER — MUPIROCIN 2 % EX OINT
1.0000 "application " | TOPICAL_OINTMENT | Freq: Two times a day (BID) | CUTANEOUS | Status: AC
  Administered 2013-09-17 – 2013-09-18 (×2): 1 via NASAL
  Filled 2013-09-17: qty 22

## 2013-09-17 MED ORDER — TEMAZEPAM 15 MG PO CAPS
15.0000 mg | ORAL_CAPSULE | Freq: Once | ORAL | Status: AC | PRN
  Administered 2013-09-17: 15 mg via ORAL
  Filled 2013-09-17: qty 1

## 2013-09-17 MED ORDER — DEXMEDETOMIDINE HCL IN NACL 400 MCG/100ML IV SOLN
0.1000 ug/kg/h | INTRAVENOUS | Status: AC
  Administered 2013-09-18: 0.3 ug/kg/h via INTRAVENOUS
  Filled 2013-09-17: qty 100

## 2013-09-17 MED ORDER — EPINEPHRINE HCL 1 MG/ML IJ SOLN
0.0000 ug/min | INTRAVENOUS | Status: DC
  Filled 2013-09-17: qty 4

## 2013-09-17 MED ORDER — NITROGLYCERIN IN D5W 200-5 MCG/ML-% IV SOLN
0.0000 ug/min | INTRAVENOUS | Status: DC
  Filled 2013-09-17: qty 250

## 2013-09-17 MED ORDER — SODIUM CHLORIDE 0.9 % IV SOLN
INTRAVENOUS | Status: AC
  Administered 2013-09-18: 1 [IU]/h via INTRAVENOUS
  Filled 2013-09-17: qty 1

## 2013-09-17 MED ORDER — PHENYLEPHRINE HCL 10 MG/ML IJ SOLN
0.0000 ug/min | INTRAVENOUS | Status: DC
  Filled 2013-09-17: qty 2

## 2013-09-17 MED ORDER — DEXTROSE 5 % IV SOLN
1.5000 g | INTRAVENOUS | Status: AC
  Administered 2013-09-18: 1.5 g via INTRAVENOUS
  Administered 2013-09-18: .75 g via INTRAVENOUS
  Filled 2013-09-17: qty 1.5

## 2013-09-17 MED ORDER — VASOPRESSIN 20 UNIT/ML IJ SOLN
0.0400 [IU]/min | INTRAVENOUS | Status: DC
  Filled 2013-09-17: qty 2.5

## 2013-09-17 MED ORDER — DOBUTAMINE IN D5W 4-5 MG/ML-% IV SOLN
2.0000 ug/kg/min | INTRAVENOUS | Status: DC
  Filled 2013-09-17: qty 250

## 2013-09-17 MED ORDER — CHLORHEXIDINE GLUCONATE CLOTH 2 % EX PADS
6.0000 | MEDICATED_PAD | Freq: Once | CUTANEOUS | Status: AC
  Administered 2013-09-18: 6 via TOPICAL

## 2013-09-17 MED ORDER — FLUCONAZOLE IN SODIUM CHLORIDE 400-0.9 MG/200ML-% IV SOLN
400.0000 mg | INTRAVENOUS | Status: AC
  Administered 2013-09-18: 400 mg via INTRAVENOUS
  Filled 2013-09-17: qty 200

## 2013-09-17 MED ORDER — SODIUM CHLORIDE 0.9 % IV SOLN
INTRAVENOUS | Status: DC
  Filled 2013-09-17: qty 40

## 2013-09-17 MED ORDER — POTASSIUM CHLORIDE CRYS ER 20 MEQ PO TBCR
40.0000 meq | EXTENDED_RELEASE_TABLET | Freq: Once | ORAL | Status: AC
  Administered 2013-09-17: 40 meq via ORAL
  Filled 2013-09-17: qty 2

## 2013-09-17 MED ORDER — BISACODYL 5 MG PO TBEC
5.0000 mg | DELAYED_RELEASE_TABLET | Freq: Once | ORAL | Status: AC
  Administered 2013-09-17: 5 mg via ORAL
  Filled 2013-09-17: qty 1

## 2013-09-17 MED ORDER — SODIUM CHLORIDE 0.9 % IV SOLN
INTRAVENOUS | Status: DC
  Filled 2013-09-17: qty 30

## 2013-09-17 MED ORDER — DEXTROSE 5 % IV SOLN
750.0000 mg | INTRAVENOUS | Status: DC
  Filled 2013-09-17: qty 750

## 2013-09-17 NOTE — Progress Notes (Signed)
ANTICOAGULATION CONSULT NOTE - Follow Up Consult  Pharmacy Consult for heparin Indication: IABP  Labs:  Recent Labs  09/15/13 0520 09/15/13 1940  09/16/13 0542 09/16/13 1400 09/17/13 09/17/13 0457 09/17/13 0954 09/17/13 1015  HGB 8.4*  --   --  8.7*  --   --  9.2*  --   --   HCT 26.4*  --   --  28.7*  --   --  29.3*  --   --   PLT 240  --   --  218  --   --  254  --   --   HEPARINUNFRC 0.11*  --   < > 0.19* 0.26* 0.28*  --   --  0.28*  CREATININE 1.86* 1.50*  --  1.41*  --   --   --  1.49*  --   < > = values in this interval not displayed.   Assessment: 73yo male now with therapeutic heparin level for IABP.  No bleeding noted. Morning HL confirmed therapeutic.  CBC stable.    Goal of Therapy:  Heparin level 0.2-0.5 units/ml   Plan:  1. Continue IV heparin at current rate of 1600 units/hr. 2. Daily heparin level and CBC.   Thank you, Vivia Ewing, PharmD Clinical Pharmacist - Resident Pager: [phone] Pharmacy: [phone] 09/17/2013 12:27 PM

## 2013-09-17 NOTE — Progress Notes (Signed)
ANTICOAGULATION CONSULT NOTE - Follow Up Consult  Pharmacy Consult for heparin Indication: IABP  Labs:  Recent Labs  09/15/13 0520 09/15/13 1940  09/16/13 0542 09/16/13 1400 09/17/13  HGB 8.4*  --   --  8.7*  --   --   HCT 26.4*  --   --  28.7*  --   --   PLT 240  --   --  218  --   --   HEPARINUNFRC 0.11*  --   < > 0.19* 0.26* 0.28*  CREATININE 1.86* 1.50*  --  1.41*  --   --   < > = values in this interval not displayed.   Assessment/Plan:  73yo male remains therapeutic on heparin for IABP.  Noted that pt lost central line.  Will continue gtt at current rate and confirm stable with additional level.  Wynona Neat, PharmD, BCPS  09/17/2013,12:36 AM

## 2013-09-17 NOTE — Progress Notes (Signed)
At 2218 : Dr. Haroldine Laws made aware of patient's current condition, vital signs , IABP, Swan and assessment. Telephone orders received to pull the swan and cordis after PIV started and given 250 ml NS fluid bolus.  Three PIV started . See Doc flow sheet. Swan pulled after instructing the patient to take a deep breath and hold during removal. Patient did as instructed. The suture holding the cordis in place was cut and it was removed in the same manor. Pressure was held to site for 6 minutes. No bleeding, bruising or swelling present at site. 4x4 gauze with teagderm dressing placed over site. Patient reported no c/o of pain or discomfort with procedure.

## 2013-09-17 NOTE — Progress Notes (Signed)
Anesthesiology Pre-op Visit:  73 year old male with end-stage ischemic cardiomyopathy now on chronic milrinone. Admitted on 1/14 with pulmonary edema and renal dysfunction, IABP placed with improvement in renal function, pulmonary edema, and mixed venous O2 Sat. Now scheduled for [facility] II LVAD for destination therapy.  Problems: Ischemic CM chronic RCA occlusion, stent to LCx in 2004, 2009. Lateral MI 2009. H/O PVCs, Medtronic ICD placed 2009, has never discharged per pt. Moderate to severe MR  Hypothyroidism Renal insufficiency Cr. 2.07 (1/14), now 1.49  VS; T- 36.8 BP 110/52 HR 82 RR-18 O2 sat 95% on RA  IABP 1:1 CVP -11 PA 59/28 PCWP 24  Plan GA with intra-op TEE, anesthetic plan discussed questions answered. I explained that he will remain intubated at least overnight following surgery.  Roberts Gaudy, MD

## 2013-09-17 NOTE — Progress Notes (Signed)
Dr. Haroldine Laws made aware of patient's current condition, vital signs, IABP and assessment. Telephone orders received. 250 ml of NS bolus infusing. Patient sleeping at present time with no signs or symptoms of distress.

## 2013-09-17 NOTE — Progress Notes (Signed)
Advanced Heart Failure Rounding Note   Subjective:    [name] is a 73 year old male with history of CAD s/p stent to LCX 2004 with re-occlusion of LCX with lateral MI 2009 and repeat stenting, total occlusion of RCA, chronic systolic heart failure EF 20% on chronic Milrinone at 0.375 mcg, ICD 2009, PVCs, hypothyroidism, hyperlipidemia, and arthritis. S/P L TKR 2013.   Admitted for VAD work up. Swan placed. IABP placed due to low CO-OX and worsening renal function.  IABP 1:1  Developed fever to 101.5 yesterday with mild drop in BP. Swan pulled. BCx and UCx drawn (UA clean). Started on vanc/zosyn. Given 500 cc NS and started on low dose norepi to maintain MAP.  Now afebrile. Feels fine.   Swan this am CVP 11 PAP 59/28 (42) PCWP 24 v waves to 40   CO-OX 60>46>55% >55% Creatinine 2.07>2.02>1.83>1.86> 1.41> 1.49   Objective:   Weight Range:  Vital Signs:   Temp:  [97.9 F (36.6 C)-101.5 F (38.6 C)] 97.9 F (36.6 C) (01/18 0700) Pulse Rate:  [69-155] 69 (01/18 1000) Resp:  [14-27] 16 (01/18 1000) BP: (74-126)/(32-89) 95/50 mmHg (01/18 1000) SpO2:  [90 %-100 %] 98 % (01/18 1000) Weight:  [71.2 kg (156 lb 15.5 oz)] 71.2 kg (156 lb 15.5 oz) (01/18 0500) Last BM Date: 09/16/13  Weight change: Filed Weights   09/15/13 0500 09/16/13 0500 09/17/13 0500  Weight: 75 kg (165 lb 5.5 oz) 72.6 kg (160 lb 0.9 oz) 71.2 kg (156 lb 15.5 oz)    Intake/Output:   Intake/Output Summary (Last 24 hours) at 09/17/13 1037 Last data filed at 09/17/13 1000  Gross per 24 hour  Intake 3008.75 ml  Output   3826 ml  Net -817.25 ml     Physical Exam: CVP 9 General: lying in bed. No respiratory difficulty.  HEENT: normal  Neck: supple. Carotids 2+ bilat; no bruits. No lymphadenopathy or thryomegaly.  Cor: PMI nondisplaced. Regular rate & rhythm. No rubs, gallops 3/6 murmur  Lungs: clear  Abdomen: soft, nontender, nondistended. No hepatosplenomegaly. No bruits or masses. Good bowel sounds.   Extremities: no cyanosis, clubbing, rash, R and LLE trace bilateral ted hose R groin IABP Neuro: alert & oriented x 3, cranial nerves grossly intact. moves all 4 extremities w/o difficulty. Affect pleasant.   ECG: SR 70s  Labs: Basic Metabolic Panel:  Recent Labs Lab 09/13/13 1349 09/14/13 0430 09/14/13 1445 09/15/13 0520 09/15/13 1940 09/16/13 0542  NA 141 141 141 138 140 138  K 4.5 3.9 3.9 3.7 3.6* 3.7  CL 104 104 102 101 104 102  CO2 22 24 25 26 25 26   GLUCOSE 101* 92 91 100* 160* 128*  BUN 41* 40* 35* 31* 25* 23  CREATININE 2.07* 2.02* 1.83* 1.86* 1.50* 1.41*  CALCIUM 8.9 8.2* 8.6 7.9* 8.3* 8.1*  MG 2.0 2.0  --   --   --   --     Liver Function Tests:  Recent Labs Lab 09/13/13 1349  AST 116*  ALT 109*  ALKPHOS 162*  BILITOT 0.9  PROT 6.9  ALBUMIN 3.0*   No results found for this basename: LIPASE, AMYLASE,  in the last 168 hours No results found for this basename: AMMONIA,  in the last 168 hours  CBC:  Recent Labs Lab 09/11/13 1138 09/13/13 1349 09/15/13 0520 09/16/13 0542 09/17/13 0457  WBC 6.5 6.8 6.3 8.3 11.4*  NEUTROABS  --  5.3  --   --   --   HGB  9.9* 9.0* 8.4* 8.7* 9.2*  HCT 31.3* 29.0* 26.4* 28.7* 29.3*  MCV 80.9 82.4 81.0 82.5 80.3  PLT 262 264 240 218 254    Cardiac Enzymes: No results found for this basename: CKTOTAL, CKMB, CKMBINDEX, TROPONINI,  in the last 168 hours  BNP: BNP (last 3 results)  Recent Labs  12/07/12 1200 09/13/13 1349  PROBNP 3310.0* 3883.0*     Other results:   Imaging: Dg Chest Port 1 View  09/17/2013   CLINICAL DATA:  Congestive heart failure.  EXAM: PORTABLE CHEST - 1 VIEW  COMPARISON:  Chest x-ray 09/16/2013.  FINDINGS: Lung volumes are normal. No consolidative airspace disease. No pleural effusions. Mild cephalization of the pulmonary vasculature and indistinctness of interstitial markings, suggesting very mild interstitial pulmonary edema. Heart size is mildly enlarged. Mediastinal contours are  unremarkable. The tip of an intra-aortic balloon pump is visualized in the proximal descending thoracic aorta. Previously noted right internal jugular Cordis and Swan-Ganz catheter have been removed. Left-sided pacemaker/ AICD with lead tips projecting over the expected location of the right atrium and right ventricular apex.  IMPRESSION: 1. Support apparatus, as above. 2. Findings are most compatible with mild congestive heart failure, as above.   Electronically Signed   By: Vinnie Langton M.D.   On: 09/17/2013 10:05   Dg Chest Port 1 View  09/16/2013   CLINICAL DATA:  Followup congestive heart failure. Intra-aortic balloon pump.  EXAM: PORTABLE CHEST - 1 VIEW  COMPARISON:  09/15/2013  FINDINGS: Support lines and tubes in appropriate position. Decreased bilateral airspace disease is seen, consistent decreased pulmonary edema. Cardiomegaly is stable. No evidence of pneumothorax or pleural effusion.  IMPRESSION: Interval decrease in diffuse pulmonary edema.  Stable cardiomegaly.   Electronically Signed   By: Earle Gell M.D.   On: 09/16/2013 08:09     Medications:     Scheduled Medications: . [START ON 09/18/2013] aminocaproic acid (AMICAR) for OHS   Intravenous To OR  . [START ON 09/18/2013] cefUROXime (ZINACEF)  IV  750 mg Intravenous To OR  . Chlorhexidine Gluconate Cloth  6 each Topical Once   And  . [START ON 09/18/2013] Chlorhexidine Gluconate Cloth  6 each Topical Once  . [START ON 09/18/2013] dexmedetomidine  0.1-0.7 mcg/kg/hr Intravenous To OR  . [START ON 09/18/2013] diazepam  5 mg Oral Once  . [START ON 09/18/2013] DOBUTamine  2-10 mcg/kg/min Intravenous To OR  . [START ON 09/18/2013] DOPamine  0-10 mcg/kg/min Intravenous To OR  . [START ON 09/18/2013] epinephrine  0-10 mcg/min Intravenous To OR  . ezetimibe  10 mg Oral Daily  . furosemide  40 mg Intravenous Daily  . [START ON 09/18/2013] heparin 30,000 units/NS 1000 mL solution for CELLSAVER   Other To OR  . [START ON 09/18/2013] insulin  (NOVOLIN-R) infusion   Intravenous To OR  . levothyroxine  150 mcg Oral Custom   And  . levothyroxine  75 mcg Oral Custom  . [START ON 09/18/2013] magnesium sulfate  40 mEq Other To OR  . [START ON 09/18/2013] milrinone  0.3 mcg/kg/min Intravenous To OR  . mupirocin ointment  1 application Nasal BID  . [START ON 09/18/2013] nitroGLYCERIN  0-100 mcg/min Intravenous To OR  . [START ON 09/18/2013] norepinephrine (LEVOPHED) Adult infusion  0-12 mcg/min Intravenous To OR  . pantoprazole  40 mg Oral Daily  . [START ON 09/18/2013] phenylephrine (NEO-SYNEPHRINE) Adult infusion  0-100 mcg/min Intravenous To OR  . piperacillin-tazobactam (ZOSYN)  IV  3.375 g Intravenous Q8H  .  potassium chloride  20 mEq Oral Daily  . [START ON 09/18/2013] potassium chloride  80 mEq Other To OR  . [START ON 09/18/2013] rifampin  600 mg Oral Once  . sildenafil  20 mg Oral TID  . sodium chloride  3 mL Intravenous Q12H  . vancomycin (VANCOCIN) 750 mg IVPB  750 mg Intravenous Q12H  . [START ON 09/18/2013] vancomycin  1,000 mg Other To OR  . [START ON 09/18/2013] vasopressin (PITRESSIN) infusion - *FOR SHOCK*  0.04 Units/min Intravenous To OR    Infusions: . sodium chloride 20 mL/hr at 09/16/13 2256  . heparin 1,600 Units/hr (09/17/13 0500)  . milrinone 0.25 mcg/kg/min (09/17/13 0500)  . norepinephrine (LEVOPHED) Adult infusion 4 mcg/min (09/17/13 1000)    PRN Medications: sodium chloride, acetaminophen, ALPRAZolam, guaiFENesin-dextromethorphan, ondansetron (ZOFRAN) IV, sodium chloride, temazepam, zolpidem   Assessment:   1. A/C Systolic Heart Failure ECHO EF 20-25% INTERMACS 2 on transplant list [place]  2. Hypothyroid  3.CAD s/p stent to LCX 2004 with re-occlusion of LCX with lateral MI 2009 and repeat stenting with a Promus, total occlusion of RCA  4. S/P ICD 2009  5. Hyperlipidemia  6. Arthritis S/P TKR 2013  7. Blood Type O negative  8. PVCs  9.Esophageial Polyp 10. Anemia 11. Moderate to severe MR 12. Fever -  ? Early sepsis 13. Hypokalemia   Plan/Discussion:    Developed fever last night and potentially early sepsis. Has responded well to therapy with IVF, broad-spectrum abx and low-dose norepi. Remains afebrile. UA and CXR ok. [place] pending.   Hold lasix. Supp K+.   Have d/w with Dr. Prescott Gum. Will continue to watch closely. If recurrent fevers may postpone VAD - otherwise may be able to proceed in am.  Continue IABP and milrinone. Can transfuse RBCs pre-op per Dr. Prescott Gum.   Plan VAD and possible TVR on Monday am.   The patient is critically ill with multiple organ systems failure and requires high complexity decision making for assessment and support, frequent evaluation and titration of therapies, application of advanced monitoring technologies and extensive interpretation of multiple databases.   Critical Care Time devoted to patient care services described in this note is 35 Minutes.  Length of Stay: 4  Alissa Pharr,MD 10:37 AM  09/17/2013, 10:37 AM Advanced Heart Failure Team Pager [phone] (M-F; 7a - 4p)  Please contact [facility] for night-coverage after hours (4p -7a ) and weekends on [email or web address]

## 2013-09-18 ENCOUNTER — Inpatient Hospital Stay (HOSPITAL_COMMUNITY): Admission: RE | Admit: 2013-09-18 | Payer: Medicare Other | Source: Ambulatory Visit | Admitting: Cardiothoracic Surgery

## 2013-09-18 ENCOUNTER — Inpatient Hospital Stay (HOSPITAL_COMMUNITY): Payer: Medicare Other

## 2013-09-18 ENCOUNTER — Encounter (HOSPITAL_COMMUNITY): Payer: Self-pay | Admitting: Gastroenterology

## 2013-09-18 ENCOUNTER — Encounter (HOSPITAL_COMMUNITY): Payer: Medicare Other | Admitting: Certified Registered"

## 2013-09-18 ENCOUNTER — Encounter (HOSPITAL_COMMUNITY)
Admission: AD | Disposition: A | Discharge status: Home Health | Payer: Medicare Other | Source: Ambulatory Visit | Attending: Cardiothoracic Surgery

## 2013-09-18 ENCOUNTER — Inpatient Hospital Stay (HOSPITAL_COMMUNITY): Payer: Medicare Other | Admitting: Certified Registered"

## 2013-09-18 DIAGNOSIS — Z95811 Presence of heart assist device: Secondary | ICD-10-CM

## 2013-09-18 DIAGNOSIS — I5023 Acute on chronic systolic (congestive) heart failure: Secondary | ICD-10-CM

## 2013-09-18 HISTORY — PX: INSERTION OF IMPLANTABLE LEFT VENTRICULAR ASSIST DEVICE: SHX5866

## 2013-09-18 HISTORY — PX: INTRAOPERATIVE TRANSESOPHAGEAL ECHOCARDIOGRAM: SHX5062

## 2013-09-18 LAB — POCT I-STAT 3, ART BLOOD GAS (G3+)
Acid-Base Excess: 2 mmol/L (ref 0.0–2.0)
Acid-Base Excess: 1 mmol/L (ref 0.0–2.0)
Acid-Base Excess: 1 mmol/L (ref 0.0–2.0)
Acid-base deficit: 1 mmol/L (ref 0.0–2.0)
Bicarbonate: 26.9 mEq/L — ABNORMAL HIGH (ref 20.0–24.0)
Bicarbonate: 25.4 mEq/L — ABNORMAL HIGH (ref 20.0–24.0)
Bicarbonate: 25.6 mEq/L — ABNORMAL HIGH (ref 20.0–24.0)
Bicarbonate: 25.6 mEq/L — ABNORMAL HIGH (ref 20.0–24.0)
Bicarbonate: 26.3 mEq/L — ABNORMAL HIGH (ref 20.0–24.0)
Bicarbonate: 24.2 mEq/L — ABNORMAL HIGH (ref 20.0–24.0)
O2 Saturation: 100 %
O2 Saturation: 100 %
O2 Saturation: 79 %
O2 Saturation: 95 %
O2 Saturation: 84 %
O2 Saturation: 98 %
Patient temperature: 36
Patient temperature: 36.1
TCO2: 28 mmol/L (ref 0–100)
TCO2: 27 mmol/L (ref 0–100)
TCO2: 27 mmol/L (ref 0–100)
TCO2: 27 mmol/L (ref 0–100)
TCO2: 28 mmol/L (ref 0–100)
TCO2: 25 mmol/L (ref 0–100)
pCO2 arterial: 43.8 mmHg (ref 35.0–45.0)
pCO2 arterial: 39.7 mmHg (ref 35.0–45.0)
pCO2 arterial: 41.1 mmHg (ref 35.0–45.0)
pCO2 arterial: 43.5 mmHg (ref 35.0–45.0)
pCO2 arterial: 39.9 mmHg (ref 35.0–45.0)
pCO2 arterial: 40.6 mmHg (ref 35.0–45.0)
pH, Arterial: 7.395 (ref 7.350–7.450)
pH, Arterial: 7.399 (ref 7.350–7.450)
pH, Arterial: 7.417 (ref 7.350–7.450)
pH, Arterial: 7.379 (ref 7.350–7.450)
pH, Arterial: 7.422 (ref 7.350–7.450)
pH, Arterial: 7.379 (ref 7.350–7.450)
pO2, Arterial: 382 mmHg — ABNORMAL HIGH (ref 80.0–100.0)
pO2, Arterial: 281 mmHg — ABNORMAL HIGH (ref 80.0–100.0)
pO2, Arterial: 43 mmHg — ABNORMAL LOW (ref 80.0–100.0)
pO2, Arterial: 75 mmHg — ABNORMAL LOW (ref 80.0–100.0)
pO2, Arterial: 45 mmHg — ABNORMAL LOW (ref 80.0–100.0)
pO2, Arterial: 102 mmHg — ABNORMAL HIGH (ref 80.0–100.0)

## 2013-09-18 LAB — CARBOXYHEMOGLOBIN
Carboxyhemoglobin: 2.5 % — ABNORMAL HIGH (ref 0.5–1.5)
Carboxyhemoglobin: 2.8 % — ABNORMAL HIGH (ref 0.5–1.5)
Methemoglobin: 2.2 % — ABNORMAL HIGH (ref 0.0–1.5)
Methemoglobin: 2.2 % — ABNORMAL HIGH (ref 0.0–1.5)
O2 Saturation: 63.2 %
O2 Saturation: 86 %
Total hemoglobin: 8.8 g/dL — ABNORMAL LOW (ref 13.5–18.0)
Total hemoglobin: 9 g/dL — ABNORMAL LOW (ref 13.5–18.0)

## 2013-09-18 LAB — POCT I-STAT 3, VENOUS BLOOD GAS (G3P V)
Acid-Base Excess: 1 mmol/L (ref 0.0–2.0)
Bicarbonate: 26.1 mEq/L — ABNORMAL HIGH (ref 20.0–24.0)
O2 Saturation: 66 %
Patient temperature: 36.2
TCO2: 27 mmol/L (ref 0–100)
pCO2, Ven: 41.4 mmHg — ABNORMAL LOW (ref 45.0–50.0)
pH, Ven: 7.404 — ABNORMAL HIGH (ref 7.250–7.300)
pO2, Ven: 33 mmHg (ref 30.0–45.0)

## 2013-09-18 LAB — BASIC METABOLIC PANEL
BUN: 19 mg/dL (ref 6–23)
BUN: 18 mg/dL (ref 6–23)
CO2: 23 mEq/L (ref 19–32)
CO2: 25 mEq/L (ref 19–32)
Calcium: 8.4 mg/dL (ref 8.4–10.5)
Calcium: 7.7 mg/dL — ABNORMAL LOW (ref 8.4–10.5)
Chloride: 100 mEq/L (ref 96–112)
Chloride: 104 mEq/L (ref 96–112)
Creatinine, Ser: 1.4 mg/dL — ABNORMAL HIGH (ref 0.50–1.35)
Creatinine, Ser: 1.14 mg/dL (ref 0.50–1.35)
GFR calc Af Amer: 57 mL/min — ABNORMAL LOW (ref >90)
GFR calc Af Amer: 73 mL/min — ABNORMAL LOW (ref >90)
GFR calc non Af Amer: 49 mL/min — ABNORMAL LOW (ref >90)
GFR calc non Af Amer: 63 mL/min — ABNORMAL LOW (ref >90)
Glucose, Bld: 116 mg/dL — ABNORMAL HIGH (ref 70–99)
Glucose, Bld: 99 mg/dL (ref 70–99)
Potassium: 4 mEq/L (ref 3.7–5.3)
Potassium: 3.4 mEq/L — ABNORMAL LOW (ref 3.7–5.3)
Sodium: 136 mEq/L — ABNORMAL LOW (ref 137–147)
Sodium: 141 mEq/L (ref 137–147)

## 2013-09-18 LAB — PLATELET COUNT: Platelets: 96 10*3/uL — ABNORMAL LOW (ref 150–400)

## 2013-09-18 LAB — POCT I-STAT 4, (NA,K, GLUC, HGB,HCT)
Glucose, Bld: 106 mg/dL — ABNORMAL HIGH (ref 70–99)
Glucose, Bld: 104 mg/dL — ABNORMAL HIGH (ref 70–99)
Glucose, Bld: 119 mg/dL — ABNORMAL HIGH (ref 70–99)
Glucose, Bld: 115 mg/dL — ABNORMAL HIGH (ref 70–99)
Glucose, Bld: 156 mg/dL — ABNORMAL HIGH (ref 70–99)
Glucose, Bld: 166 mg/dL — ABNORMAL HIGH (ref 70–99)
Glucose, Bld: 98 mg/dL (ref 70–99)
HCT: 33 % — ABNORMAL LOW (ref 39.0–52.0)
HCT: 28 % — ABNORMAL LOW (ref 39.0–52.0)
HCT: 28 % — ABNORMAL LOW (ref 39.0–52.0)
HCT: 26 % — ABNORMAL LOW (ref 39.0–52.0)
HCT: 24 % — ABNORMAL LOW (ref 39.0–52.0)
HCT: 26 % — ABNORMAL LOW (ref 39.0–52.0)
HCT: 31 % — ABNORMAL LOW (ref 39.0–52.0)
Hemoglobin: 11.2 g/dL — ABNORMAL LOW (ref 13.0–17.0)
Hemoglobin: 9.5 g/dL — ABNORMAL LOW (ref 13.0–17.0)
Hemoglobin: 9.5 g/dL — ABNORMAL LOW (ref 13.0–17.0)
Hemoglobin: 8.8 g/dL — ABNORMAL LOW (ref 13.0–17.0)
Hemoglobin: 8.2 g/dL — ABNORMAL LOW (ref 13.0–17.0)
Hemoglobin: 8.8 g/dL — ABNORMAL LOW (ref 13.0–17.0)
Hemoglobin: 10.5 g/dL — ABNORMAL LOW (ref 13.0–17.0)
Potassium: 3.5 mEq/L — ABNORMAL LOW (ref 3.7–5.3)
Potassium: 3.2 mEq/L — ABNORMAL LOW (ref 3.7–5.3)
Potassium: 4.7 mEq/L (ref 3.7–5.3)
Potassium: 3.4 mEq/L — ABNORMAL LOW (ref 3.7–5.3)
Potassium: 3.2 mEq/L — ABNORMAL LOW (ref 3.7–5.3)
Potassium: 3.6 mEq/L — ABNORMAL LOW (ref 3.7–5.3)
Potassium: 3.2 mEq/L — ABNORMAL LOW (ref 3.7–5.3)
Sodium: 139 mEq/L (ref 137–147)
Sodium: 138 mEq/L (ref 137–147)
Sodium: 136 mEq/L — ABNORMAL LOW (ref 137–147)
Sodium: 138 mEq/L (ref 137–147)
Sodium: 138 mEq/L (ref 137–147)
Sodium: 140 mEq/L (ref 137–147)
Sodium: 141 mEq/L (ref 137–147)

## 2013-09-18 LAB — CBC
HCT: 30.6 % — ABNORMAL LOW (ref 39.0–52.0)
HCT: 27.2 % — ABNORMAL LOW (ref 39.0–52.0)
HCT: 15.4 % — ABNORMAL LOW (ref 39.0–52.0)
Hemoglobin: 9.8 g/dL — ABNORMAL LOW (ref 13.0–17.0)
Hemoglobin: 9 g/dL — ABNORMAL LOW (ref 13.0–17.0)
Hemoglobin: 5 g/dL — CL (ref 13.0–17.0)
MCH: 25.9 pg — ABNORMAL LOW (ref 26.0–34.0)
MCH: 26.9 pg (ref 26.0–34.0)
MCH: 26.6 pg (ref 26.0–34.0)
MCHC: 32 g/dL (ref 30.0–36.0)
MCHC: 33.1 g/dL (ref 30.0–36.0)
MCHC: 32.5 g/dL (ref 30.0–36.0)
MCV: 81 fL (ref 78.0–100.0)
MCV: 81.2 fL (ref 78.0–100.0)
MCV: 81.9 fL (ref 78.0–100.0)
Platelets: 213 10*3/uL (ref 150–400)
Platelets: 127 10*3/uL — ABNORMAL LOW (ref 150–400)
Platelets: 176 10*3/uL (ref 150–400)
RBC: 3.78 MIL/uL — ABNORMAL LOW (ref 4.22–5.81)
RBC: 3.35 MIL/uL — ABNORMAL LOW (ref 4.22–5.81)
RBC: 1.88 MIL/uL — ABNORMAL LOW (ref 4.22–5.81)
RDW: 17.8 % — ABNORMAL HIGH (ref 11.5–15.5)
RDW: 17 % — ABNORMAL HIGH (ref 11.5–15.5)
RDW: 17.3 % — ABNORMAL HIGH (ref 11.5–15.5)
WBC: 9.7 10*3/uL (ref 4.0–10.5)
WBC: 12.3 10*3/uL — ABNORMAL HIGH (ref 4.0–10.5)
WBC: 13.3 10*3/uL — ABNORMAL HIGH (ref 4.0–10.5)

## 2013-09-18 LAB — POCT I-STAT, CHEM 8
BUN: 22 mg/dL (ref 6–23)
Calcium, Ion: 1.2 mmol/L (ref 1.13–1.30)
Chloride: 103 mEq/L (ref 96–112)
Creatinine, Ser: 1.4 mg/dL — ABNORMAL HIGH (ref 0.50–1.35)
Glucose, Bld: 118 mg/dL — ABNORMAL HIGH (ref 70–99)
HCT: 19 % — ABNORMAL LOW (ref 39.0–52.0)
Hemoglobin: 6.5 g/dL — CL (ref 13.0–17.0)
Potassium: 4.4 mEq/L (ref 3.7–5.3)
Sodium: 141 mEq/L (ref 137–147)
TCO2: 23 mmol/L (ref 0–100)

## 2013-09-18 LAB — GLUCOSE, CAPILLARY
Glucose-Capillary: 101 mg/dL — ABNORMAL HIGH (ref 70–99)
Glucose-Capillary: 102 mg/dL — ABNORMAL HIGH (ref 70–99)
Glucose-Capillary: 93 mg/dL (ref 70–99)
Glucose-Capillary: 120 mg/dL — ABNORMAL HIGH (ref 70–99)
Glucose-Capillary: 117 mg/dL — ABNORMAL HIGH (ref 70–99)

## 2013-09-18 LAB — PREPARE RBC (CROSSMATCH)

## 2013-09-18 LAB — CREATININE, SERUM
Creatinine, Ser: 1.27 mg/dL (ref 0.50–1.35)
GFR calc Af Amer: 64 mL/min — ABNORMAL LOW (ref >90)
GFR calc non Af Amer: 56 mL/min — ABNORMAL LOW (ref >90)

## 2013-09-18 LAB — DIC (DISSEMINATED INTRAVASCULAR COAGULATION)PANEL
D-Dimer, Quant: 1.35 ug/mL-FEU — ABNORMAL HIGH (ref 0.00–0.48)
Fibrinogen: 348 mg/dL (ref 204–475)
INR: 1.47 (ref 0.00–1.49)
Prothrombin Time: 17.4 seconds — ABNORMAL HIGH (ref 11.6–15.2)
aPTT: 39 seconds — ABNORMAL HIGH (ref 24–37)

## 2013-09-18 LAB — HEPARIN LEVEL (UNFRACTIONATED): Heparin Unfractionated: 0.22 IU/mL — ABNORMAL LOW (ref 0.30–0.70)

## 2013-09-18 LAB — HEMOGLOBIN AND HEMATOCRIT, BLOOD
HCT: 23.4 % — ABNORMAL LOW (ref 39.0–52.0)
Hemoglobin: 7.7 g/dL — ABNORMAL LOW (ref 13.0–17.0)

## 2013-09-18 LAB — DIC (DISSEMINATED INTRAVASCULAR COAGULATION) PANEL (NOT AT ARMC)
Platelets: 126 10*3/uL — ABNORMAL LOW (ref 150–400)
Smear Review: NONE SEEN

## 2013-09-18 LAB — MAGNESIUM
Magnesium: 1.9 mg/dL (ref 1.5–2.5)
Magnesium: 3.2 mg/dL — ABNORMAL HIGH (ref 1.5–2.5)

## 2013-09-18 SURGERY — INSERTION OF IMPLANTABLE LEFT VENTRICULAR ASSIST DEVICE
Anesthesia: General | Site: Chest

## 2013-09-18 MED ORDER — ACETAMINOPHEN 160 MG/5ML PO SOLN
1000.0000 mg | Freq: Four times a day (QID) | ORAL | Status: AC
  Administered 2013-09-19 – 2013-09-20 (×6): 1000 mg
  Filled 2013-09-18 (×6): qty 40.6

## 2013-09-18 MED ORDER — SODIUM CHLORIDE 0.9 % IV SOLN
20.0000 ug | Freq: Once | INTRAVENOUS | Status: AC
  Administered 2013-09-18: 20 ug via INTRAVENOUS
  Filled 2013-09-18: qty 5

## 2013-09-18 MED ORDER — ASPIRIN 300 MG RE SUPP
300.0000 mg | Freq: Every day | RECTAL | Status: DC
  Filled 2013-09-18 (×13): qty 1

## 2013-09-18 MED ORDER — BISACODYL 10 MG RE SUPP: 10.0000 mg | Freq: Every day | RECTAL | Status: DC

## 2013-09-18 MED ORDER — DOPAMINE-DEXTROSE 3.2-5 MG/ML-% IV SOLN
3.0000 ug/kg/min | INTRAVENOUS | Status: DC
  Administered 2013-09-18 – 2013-09-21 (×2): 3 ug/kg/min via INTRAVENOUS
  Filled 2013-09-18: qty 250

## 2013-09-18 MED ORDER — SODIUM CHLORIDE 0.9 % IV SOLN
10.0000 g | INTRAVENOUS | Status: DC | PRN
  Administered 2013-09-18: 5 g/h via INTRAVENOUS

## 2013-09-18 MED ORDER — DESMOPRESSIN ACETATE 4 MCG/ML IJ SOLN: 20.0000 ug | Freq: Once | INTRAMUSCULAR | Status: DC

## 2013-09-18 MED ORDER — SODIUM CHLORIDE 0.9 % IV SOLN
INTRAVENOUS | Status: DC
  Administered 2013-09-18: 1.5 [IU]/h via INTRAVENOUS
  Filled 2013-09-18 (×2): qty 1

## 2013-09-18 MED ORDER — PLASMA-LYTE A IV SOLN
INTRAVENOUS | Status: DC | PRN
  Administered 2013-09-18: 1 via INTRAVENOUS

## 2013-09-18 MED ORDER — METOCLOPRAMIDE HCL 5 MG/ML IJ SOLN: 10.0000 mg | Freq: Four times a day (QID) | INTRAMUSCULAR | Status: DC

## 2013-09-18 MED ORDER — POTASSIUM CHLORIDE 10 MEQ/50ML IV SOLN
10.0000 meq | INTRAVENOUS | Status: AC
  Administered 2013-09-18 (×3): 10 meq via INTRAVENOUS

## 2013-09-18 MED ORDER — MILRINONE IN DEXTROSE 20 MG/100ML IV SOLN
INTRAVENOUS | Status: DC | PRN
  Administered 2013-09-18: 0.25 ug/kg/min via INTRAVENOUS

## 2013-09-18 MED ORDER — DEXMEDETOMIDINE HCL IN NACL 400 MCG/100ML IV SOLN
0.4000 ug/kg/h | INTRAVENOUS | Status: DC
  Filled 2013-09-18: qty 100

## 2013-09-18 MED ORDER — OXYCODONE HCL 5 MG PO TABS
5.0000 mg | ORAL_TABLET | ORAL | Status: DC | PRN
  Administered 2013-09-20 – 2013-09-22 (×3): 5 mg via ORAL
  Administered 2013-09-22 – 2013-09-23 (×2): 10 mg via ORAL
  Filled 2013-09-18: qty 2
  Filled 2013-09-18 (×2): qty 1
  Filled 2013-09-18: qty 2
  Filled 2013-09-18: qty 1

## 2013-09-18 MED ORDER — ARTIFICIAL TEARS OP OINT
TOPICAL_OINTMENT | OPHTHALMIC | Status: DC | PRN
  Administered 2013-09-18: 1 via OPHTHALMIC

## 2013-09-18 MED ORDER — VANCOMYCIN HCL 1000 MG IV SOLR
INTRAVENOUS | Status: DC | PRN
  Administered 2013-09-18: 1000 mg

## 2013-09-18 MED ORDER — SODIUM CHLORIDE 0.9 % IJ SOLN
OROMUCOSAL | Status: DC | PRN
  Administered 2013-09-18: 08:00:00 via TOPICAL

## 2013-09-18 MED ORDER — LACTATED RINGERS IV SOLN: 500.0000 mL | Freq: Once | INTRAVENOUS | Status: DC | PRN

## 2013-09-18 MED ORDER — DOCUSATE SODIUM 100 MG PO CAPS
200.0000 mg | ORAL_CAPSULE | Freq: Every day | ORAL | Status: DC
  Administered 2013-09-20 – 2013-09-22 (×3): 200 mg via ORAL
  Filled 2013-09-18 (×3): qty 2

## 2013-09-18 MED ORDER — ACETAMINOPHEN 160 MG/5ML PO SOLN: 650.0000 mg | Freq: Once | ORAL | Status: AC

## 2013-09-18 MED ORDER — FENTANYL CITRATE 0.05 MG/ML IJ SOLN
INTRAMUSCULAR | Status: DC | PRN
  Administered 2013-09-18: 250 ug via INTRAVENOUS
  Administered 2013-09-18 (×2): 100 ug via INTRAVENOUS
  Administered 2013-09-18 (×2): 150 ug via INTRAVENOUS
  Administered 2013-09-18: 250 ug via INTRAVENOUS
  Administered 2013-09-18: 50 ug via INTRAVENOUS
  Administered 2013-09-18: 300 ug via INTRAVENOUS
  Administered 2013-09-18: 150 ug via INTRAVENOUS

## 2013-09-18 MED ORDER — SODIUM CHLORIDE 0.9 % IJ SOLN
3.0000 mL | Freq: Two times a day (BID) | INTRAMUSCULAR | Status: DC
Start: 2013-09-19 — End: 2013-09-23
  Administered 2013-09-19 – 2013-09-22 (×6): 3 mL via INTRAVENOUS

## 2013-09-18 MED ORDER — HEPARIN SODIUM (PORCINE) 1000 UNIT/ML IJ SOLN
INTRAMUSCULAR | Status: DC | PRN
  Administered 2013-09-18: 5000 [IU] via INTRAVENOUS
  Administered 2013-09-18: 29000 [IU] via INTRAVENOUS

## 2013-09-18 MED ORDER — ASPIRIN 81 MG PO CHEW
324.0000 mg | CHEWABLE_TABLET | Freq: Every day | ORAL | Status: DC
  Administered 2013-09-19: 324 mg
  Filled 2013-09-18: qty 4

## 2013-09-18 MED ORDER — DEXMEDETOMIDINE HCL IN NACL 200 MCG/50ML IV SOLN
0.4000 ug/kg/h | INTRAVENOUS | Status: DC
  Filled 2013-09-18: qty 50

## 2013-09-18 MED ORDER — PROTAMINE SULFATE 10 MG/ML IV SOLN
INTRAVENOUS | Status: DC | PRN
  Administered 2013-09-18 (×2): 30 mg via INTRAVENOUS
  Administered 2013-09-18: 20 mg via INTRAVENOUS
  Administered 2013-09-18 (×2): 30 mg via INTRAVENOUS

## 2013-09-18 MED ORDER — RIFAMPIN 300 MG PO CAPS
600.0000 mg | ORAL_CAPSULE | Freq: Once | ORAL | Status: DC
  Filled 2013-09-18: qty 2

## 2013-09-18 MED ORDER — METHYLPREDNISOLONE SODIUM SUCC 125 MG IJ SOLR
INTRAMUSCULAR | Status: DC | PRN
  Administered 2013-09-18: 80 mg via INTRAVENOUS

## 2013-09-18 MED ORDER — SODIUM CHLORIDE 0.9 % IV SOLN
INTRAVENOUS | Status: DC | PRN
  Administered 2013-09-18: 1000 mL via INTRAMUSCULAR

## 2013-09-18 MED ORDER — SODIUM CHLORIDE 0.9 % IV SOLN
INTRAVENOUS | Status: DC
  Administered 2013-09-24: 20 mL/h via INTRAVENOUS

## 2013-09-18 MED ORDER — EPINEPHRINE HCL 1 MG/ML IJ SOLN
4000.0000 ug | INTRAVENOUS | Status: DC | PRN
  Administered 2013-09-18: 1 ug/min via INTRAVENOUS

## 2013-09-18 MED ORDER — EPINEPHRINE HCL 1 MG/ML IJ SOLN
1.0000 ug/min | INTRAVENOUS | Status: DC
  Administered 2013-09-18: 1 ug/min via INTRAVENOUS
  Administered 2013-09-19: 3 ug/min via INTRAVENOUS
  Filled 2013-09-18 (×2): qty 4

## 2013-09-18 MED ORDER — ACETAMINOPHEN 500 MG PO TABS
1000.0000 mg | ORAL_TABLET | Freq: Four times a day (QID) | ORAL | Status: AC
  Administered 2013-09-20 – 2013-09-23 (×13): 1000 mg via ORAL
  Filled 2013-09-18 (×18): qty 2

## 2013-09-18 MED ORDER — LACTATED RINGERS IV SOLN
INTRAVENOUS | Status: DC
  Administered 2013-09-18: 20 mL/h via INTRAVENOUS
  Administered 2013-09-19: 08:00:00 via INTRAVENOUS

## 2013-09-18 MED ORDER — CALCIUM CHLORIDE 10 % IV SOLN
0.5000 g | Freq: Once | INTRAVENOUS | Status: AC
  Administered 2013-09-18: 0.5 g via INTRAVENOUS

## 2013-09-18 MED ORDER — ETOMIDATE 2 MG/ML IV SOLN
INTRAVENOUS | Status: DC | PRN
  Administered 2013-09-18: 8 mg via INTRAVENOUS

## 2013-09-18 MED ORDER — MIDAZOLAM HCL 2 MG/2ML IJ SOLN
2.0000 mg | INTRAMUSCULAR | Status: DC | PRN
  Administered 2013-09-19 (×5): 2 mg via INTRAVENOUS
  Filled 2013-09-18 (×6): qty 2

## 2013-09-18 MED ORDER — ALBUMIN HUMAN 5 % IV SOLN
INTRAVENOUS | Status: DC | PRN
  Administered 2013-09-18: 15:00:00 via INTRAVENOUS

## 2013-09-18 MED ORDER — LACTATED RINGERS IV SOLN
INTRAVENOUS | Status: DC | PRN
  Administered 2013-09-18: 08:00:00 via INTRAVENOUS

## 2013-09-18 MED ORDER — MIDAZOLAM HCL 5 MG/5ML IJ SOLN
INTRAMUSCULAR | Status: DC | PRN
  Administered 2013-09-18: 1 mg via INTRAVENOUS
  Administered 2013-09-18: 4 mg via INTRAVENOUS
  Administered 2013-09-18: 5 mg via INTRAVENOUS

## 2013-09-18 MED ORDER — DEXMEDETOMIDINE HCL IN NACL 200 MCG/50ML IV SOLN
0.1000 ug/kg/h | INTRAVENOUS | Status: DC
Start: 2013-09-18 — End: 2013-09-19
  Administered 2013-09-18 (×2): 0.7 ug/kg/h via INTRAVENOUS
  Filled 2013-09-18: qty 50

## 2013-09-18 MED ORDER — VANCOMYCIN HCL IN DEXTROSE 1-5 GM/200ML-% IV SOLN: 1000.0000 mg | Freq: Two times a day (BID) | INTRAVENOUS | Status: DC

## 2013-09-18 MED ORDER — VANCOMYCIN HCL 1000 MG IV SOLR
1000.0000 mg | INTRAVENOUS | Status: DC
  Filled 2013-09-18: qty 1000

## 2013-09-18 MED ORDER — MORPHINE SULFATE 2 MG/ML IJ SOLN
2.0000 mg | INTRAMUSCULAR | Status: DC | PRN
  Administered 2013-09-19: 2 mg via INTRAVENOUS
  Administered 2013-09-19: 4 mg via INTRAVENOUS
  Administered 2013-09-19: 2 mg via INTRAVENOUS
  Filled 2013-09-18: qty 1
  Filled 2013-09-18: qty 2
  Filled 2013-09-18: qty 1

## 2013-09-18 MED ORDER — PANTOPRAZOLE SODIUM 40 MG PO TBEC: 40.0000 mg | DELAYED_RELEASE_TABLET | Freq: Every day | ORAL | Status: DC

## 2013-09-18 MED ORDER — ACETAMINOPHEN 650 MG RE SUPP
650.0000 mg | Freq: Once | RECTAL | Status: AC
  Administered 2013-09-18: 650 mg via RECTAL

## 2013-09-18 MED ORDER — BISACODYL 5 MG PO TBEC
10.0000 mg | DELAYED_RELEASE_TABLET | Freq: Every day | ORAL | Status: DC
  Administered 2013-09-20 – 2013-09-22 (×3): 10 mg via ORAL
  Filled 2013-09-18 (×3): qty 2

## 2013-09-18 MED ORDER — MORPHINE SULFATE 2 MG/ML IJ SOLN: 1.0000 mg | INTRAMUSCULAR | Status: AC | PRN

## 2013-09-18 MED ORDER — ALBUMIN HUMAN 5 % IV SOLN
250.0000 mL | INTRAVENOUS | Status: AC | PRN
  Administered 2013-09-18 – 2013-09-19 (×4): 250 mL via INTRAVENOUS
  Filled 2013-09-18 (×3): qty 250

## 2013-09-18 MED ORDER — SODIUM CHLORIDE 0.9 % IV SOLN: 250.0000 mL | INTRAVENOUS | Status: DC

## 2013-09-18 MED ORDER — NOREPINEPHRINE BITARTRATE 1 MG/ML IJ SOLN
2.0000 ug/min | INTRAMUSCULAR | Status: DC
  Administered 2013-09-18: 2 ug/min via INTRAVENOUS
  Administered 2013-09-19: 6 ug/min via INTRAVENOUS
  Filled 2013-09-18 (×2): qty 8

## 2013-09-18 MED ORDER — FLUCONAZOLE IN SODIUM CHLORIDE 400-0.9 MG/200ML-% IV SOLN
400.0000 mg | Freq: Once | INTRAVENOUS | Status: AC
  Administered 2013-09-19: 400 mg via INTRAVENOUS
  Filled 2013-09-18: qty 200

## 2013-09-18 MED ORDER — ASPIRIN EC 325 MG PO TBEC
325.0000 mg | DELAYED_RELEASE_TABLET | Freq: Every day | ORAL | Status: DC
  Administered 2013-09-20 – 2013-10-03 (×14): 325 mg via ORAL
  Filled 2013-09-18 (×15): qty 1

## 2013-09-18 MED ORDER — SODIUM CHLORIDE 0.45 % IV SOLN: INTRAVENOUS | Status: DC

## 2013-09-18 MED ORDER — SODIUM CHLORIDE 0.9 % IR SOLN
Status: DC | PRN
  Administered 2013-09-18: 1000 mL

## 2013-09-18 MED ORDER — SODIUM CHLORIDE 0.9 % IV SOLN
600.0000 mg | INTRAVENOUS | Status: AC
  Administered 2013-09-19: 600 mg via INTRAVENOUS
  Filled 2013-09-18: qty 600

## 2013-09-18 MED ORDER — SODIUM CHLORIDE 0.9 % IJ SOLN: 3.0000 mL | INTRAMUSCULAR | Status: DC | PRN

## 2013-09-18 MED ORDER — ROCURONIUM BROMIDE 100 MG/10ML IV SOLN
INTRAVENOUS | Status: DC | PRN
  Administered 2013-09-18 (×3): 50 mg via INTRAVENOUS

## 2013-09-18 MED ORDER — POTASSIUM CHLORIDE 10 MEQ/50ML IV SOLN
10.0000 meq | INTRAVENOUS | Status: DC | PRN
  Administered 2013-09-18 – 2013-09-23 (×2): 10 meq via INTRAVENOUS
  Filled 2013-09-18 (×3): qty 50

## 2013-09-18 MED ORDER — PANTOPRAZOLE SODIUM 40 MG IV SOLR
40.0000 mg | INTRAVENOUS | Status: DC
  Administered 2013-09-18 – 2013-09-22 (×5): 40 mg via INTRAVENOUS
  Filled 2013-09-18 (×7): qty 40

## 2013-09-18 MED ORDER — FAMOTIDINE IN NACL 20-0.9 MG/50ML-% IV SOLN
20.0000 mg | Freq: Two times a day (BID) | INTRAVENOUS | Status: DC
  Administered 2013-09-18: 20 mg via INTRAVENOUS

## 2013-09-18 MED ORDER — ONDANSETRON HCL 4 MG/2ML IJ SOLN
4.0000 mg | Freq: Four times a day (QID) | INTRAMUSCULAR | Status: DC | PRN
  Administered 2013-09-21 – 2013-09-23 (×2): 4 mg via INTRAVENOUS
  Filled 2013-09-18 (×2): qty 2

## 2013-09-18 MED ORDER — NOREPINEPHRINE BITARTRATE 1 MG/ML IJ SOLN
4000.0000 ug | INTRAVENOUS | Status: DC | PRN
  Administered 2013-09-18: 5 ug/min via INTRAVENOUS

## 2013-09-18 MED ORDER — HEMOSTATIC AGENTS (NO CHARGE) OPTIME
TOPICAL | Status: DC | PRN
  Administered 2013-09-18 (×3): 1 via TOPICAL

## 2013-09-18 MED ORDER — DEXTROSE 5 % IV SOLN
1.5000 g | Freq: Two times a day (BID) | INTRAVENOUS | Status: DC
  Filled 2013-09-18 (×2): qty 1.5

## 2013-09-18 MED ORDER — INSULIN REGULAR BOLUS VIA INFUSION
0.0000 [IU] | Freq: Three times a day (TID) | INTRAVENOUS | Status: DC
  Filled 2013-09-18: qty 10

## 2013-09-18 MED ORDER — METOCLOPRAMIDE HCL 5 MG/ML IJ SOLN
10.0000 mg | Freq: Four times a day (QID) | INTRAMUSCULAR | Status: DC
  Administered 2013-09-18 – 2013-09-23 (×18): 10 mg via INTRAVENOUS
  Filled 2013-09-18 (×20): qty 2

## 2013-09-18 MED ORDER — MAGNESIUM SULFATE 40 MG/ML IJ SOLN
4.0000 g | Freq: Once | INTRAMUSCULAR | Status: AC
  Administered 2013-09-18: 4 g via INTRAVENOUS
  Filled 2013-09-18: qty 100

## 2013-09-18 SURGICAL SUPPLY — 165 items
ADAPTER CARDIO PERF ANTE/RETRO (ADAPTER) ×4 IMPLANT
ADAPTER DLP PERFUSION .25INX2I (MISCELLANEOUS) ×4 IMPLANT
ADH SKN CLS APL DERMABOND .7 (GAUZE/BANDAGES/DRESSINGS) ×2
ADH SRG 12 PREFL SYR 3 SPRDR (MISCELLANEOUS) ×2
ADPR CRDPLG .25X.64 STRL (MISCELLANEOUS) ×2
ADPR PRFSN 84XANTGRD RTRGD (ADAPTER) ×2
ANTEGRADE CPLG (MISCELLANEOUS) IMPLANT
APL SRG 7X2 LUM MLBL SLNT (VASCULAR PRODUCTS) ×4
APPLICATOR COTTON TIP 6IN STRL (MISCELLANEOUS) IMPLANT
APPLICATOR TIP COSEAL (VASCULAR PRODUCTS) ×8 IMPLANT
ATTRACTOMAT 16X20 MAGNETIC DRP (DRAPES) ×4 IMPLANT
BAG DECANTER FOR FLEXI CONT (MISCELLANEOUS) ×12 IMPLANT
BLADE STERNUM SYSTEM 6 (BLADE) ×4 IMPLANT
BLADE SURG 11 STRL SS (BLADE) ×4 IMPLANT
BLADE SURG 12 STRL SS (BLADE) ×4 IMPLANT
BLADE SURG 15 STRL LF DISP TIS (BLADE) ×2 IMPLANT
BLADE SURG 15 STRL SS (BLADE) ×4
BLADE SURG ROTATE 9660 (MISCELLANEOUS) ×4 IMPLANT
BOOT SUTURE AID YELLOW STND (SUTURE) IMPLANT
CANISTER SUCTION 2500CC (MISCELLANEOUS) ×8 IMPLANT
CANN PRFSN 3/8XCNCT ST RT ANG (MISCELLANEOUS) ×2
CANN PRFSN 3/8XRT ANG TPR 14 (MISCELLANEOUS) ×2
CANNULA AORTIC ROOT 20012 (MISCELLANEOUS) ×4 IMPLANT
CANNULA ARTERIAL NVNT 3/8 20FR (MISCELLANEOUS) IMPLANT
CANNULA GUNDRY RCSP 15FR (MISCELLANEOUS) IMPLANT
CANNULA PRFSN 3/8XCNCT RT ANG (MISCELLANEOUS) ×2 IMPLANT
CANNULA PRFSN 3/8XRT ANG TPR14 (MISCELLANEOUS) ×2 IMPLANT
CANNULA VEN MTL TIP RT (MISCELLANEOUS) ×8
CANNULA VENOUS LOW PROF 34X46 (CANNULA) IMPLANT
CATH CPB KIT VANTRIGT (MISCELLANEOUS) ×4 IMPLANT
CATH FOLEY 2WAY SLVR  5CC 14FR (CATHETERS) ×2
CATH FOLEY 2WAY SLVR 5CC 14FR (CATHETERS) ×2 IMPLANT
CATH HEART VENT LEFT (CATHETERS) IMPLANT
CATH HYDRAGLIDE XL THORACIC (CATHETERS) ×4 IMPLANT
CATH RETROPLEGIA CORONARY 14FR (CATHETERS) IMPLANT
CATH ROBINSON RED A/P 18FR (CATHETERS) ×12 IMPLANT
CATH THORACIC 28FR (CATHETERS) IMPLANT
CATH THORACIC 36FR RT ANG (CATHETERS) ×8 IMPLANT
CHLORAPREP W/TINT 26ML (MISCELLANEOUS) ×4 IMPLANT
CLIP FOGARTY SPRING 6M (CLIP) IMPLANT
CONN 1/2X1/2X1/2  BEN (MISCELLANEOUS) ×2
CONN 1/2X1/2X1/2 BEN (MISCELLANEOUS) ×2 IMPLANT
CONN 3/8X1/2 ST GISH (MISCELLANEOUS) ×8 IMPLANT
CONN ST 1/4X3/8  BEN (MISCELLANEOUS) ×4
CONN ST 1/4X3/8 BEN (MISCELLANEOUS) ×4 IMPLANT
CONT SPECI 4OZ STER CLIK (MISCELLANEOUS) ×4 IMPLANT
COVER MAYO STAND STRL (DRAPES) ×4 IMPLANT
COVER SURGICAL LIGHT HANDLE (MISCELLANEOUS) ×8 IMPLANT
CRADLE DONUT ADULT HEAD (MISCELLANEOUS) ×4 IMPLANT
DERMABOND ADVANCED (GAUZE/BANDAGES/DRESSINGS) ×2
DERMABOND ADVANCED .7 DNX12 (GAUZE/BANDAGES/DRESSINGS) ×2 IMPLANT
DRAIN CHANNEL 28F RND 3/8 FF (WOUND CARE) IMPLANT
DRAIN CHANNEL 32F RND 10.7 FF (WOUND CARE) ×12 IMPLANT
DRAPE BILATERAL SPLIT (DRAPES) ×4 IMPLANT
DRAPE CV SPLIT W-CLR ANES SCRN (DRAPES) ×4 IMPLANT
DRAPE INCISE IOBAN 66X45 STRL (DRAPES) ×8 IMPLANT
DRAPE SLUSH/WARMER DISC (DRAPES) ×4 IMPLANT
DRSG AQUACEL AG ADV 3.5X14 (GAUZE/BANDAGES/DRESSINGS) ×4 IMPLANT
ELECT BLADE 4.0 EZ CLEAN MEGAD (MISCELLANEOUS) ×4
ELECT BLADE 6.5 EXT (BLADE) ×8 IMPLANT
ELECT CAUTERY BLADE 6.4 (BLADE) ×4 IMPLANT
ELECT REM PT RETURN 9FT ADLT (ELECTROSURGICAL) ×8
ELECTRODE BLDE 4.0 EZ CLN MEGD (MISCELLANEOUS) ×2 IMPLANT
ELECTRODE REM PT RTRN 9FT ADLT (ELECTROSURGICAL) ×4 IMPLANT
FELT TEFLON 6X6 (MISCELLANEOUS) ×4 IMPLANT
GLOVE BIO SURGEON STRL SZ 6 (GLOVE) ×16 IMPLANT
GLOVE BIO SURGEON STRL SZ 6.5 (GLOVE) ×12 IMPLANT
GLOVE BIO SURGEON STRL SZ7 (GLOVE) ×8 IMPLANT
GLOVE BIO SURGEON STRL SZ7.5 (GLOVE) ×12 IMPLANT
GLOVE BIO SURGEONS STRL SZ 6.5 (GLOVE) ×4
GLOVE BIOGEL PI IND STRL 6 (GLOVE) ×8 IMPLANT
GLOVE BIOGEL PI IND STRL 6.5 (GLOVE) ×8 IMPLANT
GLOVE BIOGEL PI IND STRL 7.0 (GLOVE) ×8 IMPLANT
GLOVE BIOGEL PI INDICATOR 6 (GLOVE) ×8
GLOVE BIOGEL PI INDICATOR 6.5 (GLOVE) ×8
GLOVE BIOGEL PI INDICATOR 7.0 (GLOVE) ×8
GOWN PREVENTION PLUS XLARGE (GOWN DISPOSABLE) ×8 IMPLANT
GOWN STRL NON-REIN LRG LVL3 (GOWN DISPOSABLE) ×32 IMPLANT
HEMOSTAT POWDER SURGIFOAM 1G (HEMOSTASIS) ×16 IMPLANT
HEMOSTAT SURGICEL 2X14 (HEMOSTASIS) ×4 IMPLANT
HM II SYSTEM CONTROLLER ×4 IMPLANT
HM II VENTR. ASSIST DEVICE BP (Prosthesis & Implant Heart) ×4 IMPLANT
INSERT FOGARTY XLG (MISCELLANEOUS) IMPLANT
IV NS 1000ML (IV SOLUTION) ×12
IV NS 1000ML BAXH (IV SOLUTION) ×6 IMPLANT
KIT BASIN OR (CUSTOM PROCEDURE TRAY) ×4 IMPLANT
KIT ROOM TURNOVER OR (KITS) ×4 IMPLANT
KIT SUCTION CATH 14FR (SUCTIONS) ×4 IMPLANT
LEAD PACING MYOCARDI (MISCELLANEOUS) ×4 IMPLANT
LINE VENT (MISCELLANEOUS) ×4 IMPLANT
LOOP VESSEL SUPERMAXI WHITE (MISCELLANEOUS) ×4 IMPLANT
NDL SUT 4 .5 CRC FRENCH EYE (NEEDLE) ×2 IMPLANT
NEEDLE 22X1 1/2 (OR ONLY) (NEEDLE) ×4 IMPLANT
NEEDLE FRENCH EYE (NEEDLE) ×4
NS IRRIG 1000ML POUR BTL (IV SOLUTION) ×20 IMPLANT
PACK OPEN HEART (CUSTOM PROCEDURE TRAY) ×4 IMPLANT
PAD ARMBOARD 7.5X6 YLW CONV (MISCELLANEOUS) ×8 IMPLANT
PAD DEFIB R2 (MISCELLANEOUS) ×4 IMPLANT
PEDIATRIC SUCKERS (MISCELLANEOUS) ×4 IMPLANT
PUNCH AORTIC ROTATE 4.5MM 8IN (MISCELLANEOUS) ×4 IMPLANT
SEALANT SURG COSEAL 8ML (VASCULAR PRODUCTS) ×4 IMPLANT
SET CARDIOPLEGIA MPS 5001102 (MISCELLANEOUS) ×4 IMPLANT
SHEATH AVANTI 11CM 5FR (MISCELLANEOUS) IMPLANT
SPONGE GAUZE 4X4 12PLY (GAUZE/BANDAGES/DRESSINGS) ×8 IMPLANT
SPONGE GAUZE 4X4 12PLY STER LF (GAUZE/BANDAGES/DRESSINGS) ×4 IMPLANT
SPONGE LAP 18X18 X RAY DECT (DISPOSABLE) ×12 IMPLANT
SPONGE LAP 4X18 X RAY DECT (DISPOSABLE) ×4 IMPLANT
STOPCOCK 4 WAY LG BORE MALE ST (IV SETS) ×4 IMPLANT
SUCKER INTRACARDIAC WEIGHTED (SUCKER) ×8 IMPLANT
SUCKER WEIGHTED FLEX (MISCELLANEOUS) ×4 IMPLANT
SURGIFLO TRUKIT (HEMOSTASIS) ×4 IMPLANT
SURGIFLO W/THROMBIN 8M KIT (HEMOSTASIS) ×4 IMPLANT
SUT BONE WAX W31G (SUTURE) ×4 IMPLANT
SUT ETHIBOND 2 0 SH (SUTURE) ×20 IMPLANT
SUT ETHIBOND 2 0 SH 36X2 (SUTURE) ×10 IMPLANT
SUT ETHIBOND 2 0 V4 (SUTURE) IMPLANT
SUT ETHIBOND 2 0V4 GREEN (SUTURE) IMPLANT
SUT ETHIBOND 4 0 TF (SUTURE) IMPLANT
SUT ETHIBOND 5 0 C 1 30 (SUTURE) IMPLANT
SUT ETHIBOND 5 LR DA (SUTURE) ×12 IMPLANT
SUT ETHIBOND NAB MH 2-0 36IN (SUTURE) ×88 IMPLANT
SUT ETHILON 3 0 FSL (SUTURE) ×4 IMPLANT
SUT PROLENE 2 0 MH 48 (SUTURE) ×36 IMPLANT
SUT PROLENE 3 0 RB 1 (SUTURE) IMPLANT
SUT PROLENE 3 0 SH 1 (SUTURE) ×4 IMPLANT
SUT PROLENE 3 0 SH DA (SUTURE) ×8 IMPLANT
SUT PROLENE 4 0 RB 1 (SUTURE) ×16
SUT PROLENE 4 0 SH DA (SUTURE) ×40 IMPLANT
SUT PROLENE 4-0 RB1 .5 CRCL 36 (SUTURE) ×8 IMPLANT
SUT PROLENE 5 0 C 1 36 (SUTURE) ×4 IMPLANT
SUT PROLENE 5 0 C1 (SUTURE) ×20 IMPLANT
SUT PROLENE 6 0 C 1 30 (SUTURE) ×20 IMPLANT
SUT SILK  1 MH (SUTURE) ×12
SUT SILK 1 MH (SUTURE) ×12 IMPLANT
SUT SILK 1 TIES 10X30 (SUTURE) ×4 IMPLANT
SUT SILK 2 0 SH CR/8 (SUTURE) ×12 IMPLANT
SUT SILK 2 0SH CR/8 30 (SUTURE) ×4 IMPLANT
SUT STEEL 6MS V (SUTURE) ×8 IMPLANT
SUT STEEL SZ 6 DBL 3X14 BALL (SUTURE) ×4 IMPLANT
SUT TEM PAC WIRE 2 0 SH (SUTURE) ×8 IMPLANT
SUT VIC AB 1 CTX 18 (SUTURE) ×16 IMPLANT
SUT VIC AB 1 CTX 36 (SUTURE) ×16
SUT VIC AB 1 CTX36XBRD ANBCTR (SUTURE) ×8 IMPLANT
SUT VIC AB 2-0 CTX 27 (SUTURE) ×16 IMPLANT
SUT VIC AB 3-0 SH 8-18 (SUTURE) ×4 IMPLANT
SUT VIC AB 3-0 X1 27 (SUTURE) ×12 IMPLANT
SUT VICRYL 2 TP 1 (SUTURE) IMPLANT
SYR 10ML KIT SKIN ADHESIVE (MISCELLANEOUS) ×4 IMPLANT
SYR 50ML LL SCALE MARK (SYRINGE) ×4 IMPLANT
SYRINGE 10CC LL (SYRINGE) ×8 IMPLANT
SYSTEM SAHARA CHEST DRAIN ATS (WOUND CARE) ×4 IMPLANT
TAPE CLOTH SURG 4X10 WHT LF (GAUZE/BANDAGES/DRESSINGS) ×4 IMPLANT
TAPE PAPER 2X10 WHT MICROPORE (GAUZE/BANDAGES/DRESSINGS) ×4 IMPLANT
TOWEL OR 17X24 6PK STRL BLUE (TOWEL DISPOSABLE) ×8 IMPLANT
TOWEL OR 17X26 10 PK STRL BLUE (TOWEL DISPOSABLE) ×8 IMPLANT
TRAY CATH LUMEN 1 20CM STRL (SET/KITS/TRAYS/PACK) ×4 IMPLANT
TRAY FOLEY IC TEMP SENS 14FR (CATHETERS) ×4 IMPLANT
TUBE CONNECTING 12'X1/4 (SUCTIONS) ×1
TUBE CONNECTING 12X1/4 (SUCTIONS) ×3 IMPLANT
TUBE SUCT INTRACARD DLP 20F (MISCELLANEOUS) ×4 IMPLANT
TUBING ART PRESS 48 MALE/FEM (TUBING) ×8 IMPLANT
UNDERPAD 30X30 INCONTINENT (UNDERPADS AND DIAPERS) ×4 IMPLANT
VENT LEFT HEART 12002 (CATHETERS)
WATER STERILE IRR 1000ML POUR (IV SOLUTION) ×8 IMPLANT
YANKAUER SUCT BULB TIP NO VENT (SUCTIONS) ×4 IMPLANT

## 2013-09-18 NOTE — Progress Notes (Signed)
The patient was examined and preop studies reviewed. There has been no change from the prior exam and the patient is ready for surgery.   Plan implantation of LVD and tricuspid valve repair Rinaldo Ratel today

## 2013-09-18 NOTE — Clinical Documentation Improvement (Signed)
THIS DOCUMENT IS NOT A PERMANENT PART OF THE MEDICAL RECORD  09/18/13   Dear Dr. Pierre Bali,  In a better effort to capture your patient's severity of illness, reflect appropriate length of stay and utilization of resources, a review of the patient medical record has revealed the following indicators.    In the H&P from 09/13/13, you documented "Also his renal function has been getting worse."   Possible Clinical Conditions?        Acute on Chronic Renal Failure       Chronic Renal Failure       Cannot Clinically Determine   Also, please specify the Stage of Chronic Kidney Disease.   _______CKD Stage I - GFR > OR = 90  _______CKD Stage II - GFR 60-80  _______CKD Stage III - GFR 30-59  _______CKD Stage IV - GFR 15-29  _______CKD Stage V - GFR < 15  _______Cannot Clinically determine   Labs:  Creatinine  09/13/13  2.07 mg/dL  Creatinine  08/25/13 1.38 mg/dL  Creatinine  12/09/12  1.50 mg/dL  Creatinine  12/30/11   1.20 mg/dL   GFR   09/13/13  31 mL/min  GFR   08/25/13  50 mL/min  GFR   12/09/12  46 mL/min  GFR   12/30/11   66 mL/min  You may use possible, probable, or suspect with inpatient documentation.  Possible, probable, suspected diagnoses MUST be documented at the time of discharge.  Reviewed: additional documentation in the medical record  Thank You,  Posey Pronto, RN, BSN, [place] Documentation Improvement Specialist HIM department--[place] Office [phone]

## 2013-09-18 NOTE — Progress Notes (Signed)
Advanced Heart Failure Rounding Note   Subjective:    Mr Bigbie is a 73 year old male with history of CAD s/p stent to LCX 2004 with re-occlusion of LCX with lateral MI 2009 and repeat stenting, total occlusion of RCA, chronic systolic heart failure EF 20% on chronic Milrinone at 0.375 mcg, ICD 2009, PVCs, hypothyroidism, hyperlipidemia, and arthritis. S/P L TKR 2013.   Admitted for VAD work up. Swan placed. IABP placed due to low CO-OX and worsening renal function.   Underwent VAD today. Intraoperatively RV looked good. LV was friable and soft and had trouble with bleeding.   Back in SICU now. VAD speed 9000. Had about 30 mins of low flow alarms. PEEP decreased and now better.   Still draining 150-200 through tubes. Getting FFP and DDAVP.   VAD par  Flow 3.6 PI 4.7 Power 4.5  CVP 16 PA 34/20  Objective:   Weight Range:  Vital Signs:   Temp:  [96.8 F (36 C)-100.5 F (38.1 C)] 97 F (36.1 C) (01/19 1845) Pulse Rate:  [30-90] 30 (01/19 1845) Resp:  [6-20] 12 (01/19 1845) BP: (99-127)/(41-94) 127/75 mmHg (01/19 0700) SpO2:  [92 %-100 %] 100 % (01/19 1845) Arterial Line BP: (86-150)/(44-79) 126/45 mmHg (01/19 1845) FiO2 (%):  [50 %-100 %] 90 % (01/19 1755) Weight:  [66.1 kg (145 lb 11.6 oz)] 66.1 kg (145 lb 11.6 oz) (01/19 0500) Last BM Date: 09/17/13  Weight change: Filed Weights   09/16/13 0500 09/17/13 0500 09/18/13 0500  Weight: 72.6 kg (160 lb 0.9 oz) 71.2 kg (156 lb 15.5 oz) 66.1 kg (145 lb 11.6 oz)    Intake/Output:   Intake/Output Summary (Last 24 hours) at 09/18/13 1902 Last data filed at 09/18/13 1830  Gross per 24 hour  Intake 6513.23 ml  Output   2785 ml  Net 3728.23 ml     General: intubated/sedated HEENT: ETT Neck: supple. RIJ swan Cor: bandaged. + LVAD hum Lungs: rhonchi Abdomen: +distended. quiet Extremities: +edema R groin IABP Neuro: intubated sedated  ECG: SR   Labs: Basic Metabolic Panel:  Recent Labs Lab 09/13/13 1349 09/14/13 0430   09/15/13 1940 09/16/13 0542 09/17/13 0954 09/18/13 0326  09/18/13 1154 09/18/13 1254 09/18/13 1321 09/18/13 1518 09/18/13 1535  NA 141 141  < > 140 138 136* 136*  < > 138 138 140 141 141  K 4.5 3.9  < > 3.6* 3.7 3.3* 4.0  < > 3.4* 3.6* 3.2* 3.4* 3.2*  CL 104 104  < > 104 102 98 100  --   --   --   --  104  --   CO2 22 24  < > 25 26 24 23   --   --   --   --  25  --   GLUCOSE 101* 92  < > 160* 128* 158* 116*  < > 115* 156* 166* 99 98  BUN 41* 40*  < > 25* 23 21 19   --   --   --   --  18  --   CREATININE 2.07* 2.02*  < > 1.50* 1.41* 1.49* 1.40*  --   --   --   --  1.14  --   CALCIUM 8.9 8.2*  < > 8.3* 8.1* 8.2* 8.4  --   --   --   --  7.7*  --   MG 2.0 2.0  --   --   --   --   --   --   --   --   --  1.9  --   < > = values in this interval not displayed.  Liver Function Tests:  Recent Labs Lab 09/13/13 1349  AST 116*  ALT 109*  ALKPHOS 162*  BILITOT 0.9  PROT 6.9  ALBUMIN 3.0*   No results found for this basename: LIPASE, AMYLASE,  in the last 168 hours No results found for this basename: AMMONIA,  in the last 168 hours  CBC:  Recent Labs Lab 09/13/13 1349 09/15/13 0520 09/16/13 0542 09/17/13 0457 09/18/13 0326  09/18/13 1151 09/18/13 1154 09/18/13 1254 09/18/13 1321 09/18/13 1500 09/18/13 1518 09/18/13 1535  WBC 6.8 6.3 8.3 11.4* 9.7  --   --   --   --   --   --  12.3*  --   NEUTROABS 5.3  --   --   --   --   --   --   --   --   --   --   --   --   HGB 9.0* 8.4* 8.7* 9.2* 9.8*  < > 7.7* 8.8* 8.8* 8.2*  --  9.0* 10.5*  HCT 29.0* 26.4* 28.7* 29.3* 30.6*  < > 23.4* 26.0* 26.0* 24.0*  --  27.2* 31.0*  MCV 82.4 81.0 82.5 80.3 81.0  --   --   --   --   --   --  81.2  --   PLT 264 240 218 254 213  --  96*  --   --   --  126* 127*  --   < > = values in this interval not displayed.  Cardiac Enzymes: No results found for this basename: CKTOTAL, CKMB, CKMBINDEX, TROPONINI,  in the last 168 hours  BNP: BNP (last 3 results)  Recent Labs  12/07/12 1200  09/13/13 1349  PROBNP 3310.0* 3883.0*     Other results:   Imaging: Dg Chest Port 1 View  09/18/2013   CLINICAL DATA:  Postop from placement of a ventricular assist device.  EXAM: PORTABLE CHEST - 1 VIEW  COMPARISON:  09/17/2013  FINDINGS: Cardiac surgery and placement of a left ventricular assist device has been performed since the prior exam. The cardiac silhouette is mildly enlarged, but stable. There is no mediastinal widening. Hazy airspace opacity projects throughout the perihilar regions consistent with mild edema.  An endotracheal tube has its tip projecting 4.2 cm above the carina. A right internal jugular Swan-Ganz catheter has its tip in the main pulmonary artery. A second right internal jugular central venous line has its tip in the mid superior vena cava. There is an intra aortic balloon pump with its tip just below the aortic knob. A left chest tube has its tip at the left apex. Another tube extends over the left hemidiaphragm with another adjacent to the medial right hemidiaphragm. A nasogastric tube passes below the diaphragm into the stomach.  No gross pneumothorax is noted on the supine study.  IMPRESSION: Status post cardiac surgery and left ventricular assist device placement.  Mild central pulmonary edema is noted.  Support apparatus is well positioned as detailed.   Electronically Signed   By: Lajean Manes M.D.   On: 09/18/2013 16:41   Dg Chest Port 1 View  09/17/2013   CLINICAL DATA:  Congestive heart failure.  EXAM: PORTABLE CHEST - 1 VIEW  COMPARISON:  Chest x-ray 09/16/2013.  FINDINGS: Lung volumes are normal. No consolidative airspace disease. No pleural effusions. Mild cephalization of the pulmonary vasculature and indistinctness of interstitial markings, suggesting very mild interstitial  pulmonary edema. Heart size is mildly enlarged. Mediastinal contours are unremarkable. The tip of an intra-aortic balloon pump is visualized in the proximal descending thoracic aorta.  Previously noted right internal jugular Cordis and Swan-Ganz catheter have been removed. Left-sided pacemaker/ AICD with lead tips projecting over the expected location of the right atrium and right ventricular apex.  IMPRESSION: 1. Support apparatus, as above. 2. Findings are most compatible with mild congestive heart failure, as above.   Electronically Signed   By: Vinnie Langton M.D.   On: 09/17/2013 10:05     Medications:     Scheduled Medications: . [START ON 09/19/2013] acetaminophen  1,000 mg Oral Q6H   Or  . [START ON 09/19/2013] acetaminophen (TYLENOL) oral liquid 160 mg/5 mL  1,000 mg Per Tube Q6H  . [START ON 09/19/2013] aspirin EC  325 mg Oral Daily   Or  . [START ON 09/19/2013] aspirin  324 mg Per Tube Daily   Or  . [START ON 09/19/2013] aspirin  300 mg Rectal Daily  . [START ON 09/19/2013] bisacodyl  10 mg Oral Daily   Or  . [START ON 09/19/2013] bisacodyl  10 mg Rectal Daily  . desmopressin (DDAVP) IV  20 mcg Intravenous Once  . [START ON 09/19/2013] docusate sodium  200 mg Oral Daily  . ezetimibe  10 mg Oral Daily  . [START ON 09/19/2013] fluconazole (DIFLUCAN) IV  400 mg Intravenous Once  . insulin regular  0-10 Units Intravenous TID WC  . levothyroxine  150 mcg Oral Custom   And  . levothyroxine  75 mcg Oral Custom  . magnesium sulfate  4 g Intravenous Once  . metoCLOPramide (REGLAN) injection  10 mg Intravenous Q6H  . pantoprazole (PROTONIX) IV  40 mg Intravenous Q24H  . piperacillin-tazobactam (ZOSYN)  IV  3.375 g Intravenous Q8H  . potassium chloride  10 mEq Intravenous Q1 Hr x 3  . [START ON 09/19/2013] rifampin (RIFADIN) IVPB  600 mg Intravenous Q24H  . [START ON 09/19/2013] sodium chloride  3 mL Intravenous Q12H  . vancomycin (VANCOCIN) 750 mg IVPB  750 mg Intravenous Q12H    Infusions: . sodium chloride 20 mL/hr (09/18/13 1828)  . sodium chloride 10 mL/hr (09/18/13 1828)  . [START ON 09/19/2013] sodium chloride    . dexmedetomidine 0.7 mcg/kg/hr (09/18/13 1829)   . DOPamine 2.985 mcg/kg/min (09/18/13 1800)  . EPINEPHrine infusion 2 mcg/min (09/18/13 1800)  . insulin (NOVOLIN-R) infusion 1.8 Units/hr (09/18/13 1819)  . lactated ringers 20 mL/hr at 09/18/13 1800  . norepinephrine (LEVOPHED) Adult infusion 2.027 mcg/min (09/18/13 1800)    PRN Medications: sodium chloride, albumin human, midazolam, morphine injection, morphine injection, ondansetron (ZOFRAN) IV, oxyCODONE, [START ON 09/19/2013] sodium chloride, zolpidem   Assessment:   1. A/C Systolic Heart Failure ECHO EF 20-25%    --s/p HM II VAD 09/18/13 2.CAD  3. Acute on chronic renal failure, stage III 4. Post-op bleeding  Plan/Discussion:    Stable s/p VAD placement today. Continue current support.  Will be important to keep up with blood loss. Repeat CBC pending at 9p. Agree with FFP and DDAVP. Can turn speed to 8800 if needed.   VAD parameters reviewed personally and d/w Dr. Prescott Gum  The patient is critically ill with multiple organ systems failure and requires high complexity decision making for assessment and support, frequent evaluation and titration of therapies, application of advanced monitoring technologies and extensive interpretation of multiple databases.   Critical Care Time devoted to patient care services described in  this note is 35 Minutes.  Length of Stay: 5  Adrion Menz,MD 7:02 PM  09/18/2013, 7:02 PM Advanced Heart Failure Team Pager [phone] (M-F; 7a - 4p)  Please contact [facility] for night-coverage after hours (4p -7a ) and weekends on [email or web address]

## 2013-09-18 NOTE — Progress Notes (Signed)
CT surgery p.m. Rounds  Status post LVAD implantation for ischemic cardiomyopathy RV function and hemodynamics overall stable on low-dose pressors Persistent chest tube output 150-200 cc per hour-DDAVP and FFP ordered Continue patient on nitric oxide and full ventilator support until a.m. rounds-plan on removing balloon pump tomorrow a.m.

## 2013-09-18 NOTE — Progress Notes (Signed)
OT Cancellation Note  Patient Details Name: Johnny Oneill MRN: [id number] DOB: April 06, 1941   Cancelled Treatment:    Reason Eval/Treat Not Completed: Patient not medically ready - will initiate OT eval 09/20/13 per oder - POD #2.  Lucille Passy, OTR/L [id number]  09/18/2013, 4:52 PM

## 2013-09-18 NOTE — Anesthesia Preprocedure Evaluation (Addendum)
Anesthesia Evaluation  Patient identified by MRN, date of birth, ID band Patient awake    Reviewed: Allergy & Precautions, H&P , NPO status , Patient's Chart, lab work & pertinent test results, reviewed documented beta blocker date and time   History of Anesthesia Complications Negative for: history of anesthetic complications  Airway Mallampati: II TM Distance: >3 FB Neck ROM: Full    Dental  (+) Teeth Intact and Dental Advisory Given   Pulmonary shortness of breath and at rest,  breath sounds clear to auscultation        Cardiovascular hypertension, + CAD, + Past MI and +CHF + dysrhythmias + Cardiac Defibrillator Rhythm:Regular Rate:Normal     Neuro/Psych Anxiety    GI/Hepatic   Endo/Other  Hypothyroidism   Renal/GU Renal InsufficiencyRenal disease     Musculoskeletal   Abdominal   Peds  Hematology  (+) anemia ,   Anesthesia Other Findings   Reproductive/Obstetrics                          Anesthesia Physical Anesthesia Plan  ASA: IV  Anesthesia Plan: General   Post-op Pain Management:    Induction: Intravenous  Airway Management Planned: Oral ETT  Additional Equipment: Arterial line, CVP, PA Cath, 3D TEE and Ultrasound Guidance Line Placement  Intra-op Plan:   Post-operative Plan: Post-operative intubation/ventilation  Informed Consent: I have reviewed the patients History and Physical, chart, labs and discussed the procedure including the risks, benefits and alternatives for the proposed anesthesia with the patient or authorized representative who has indicated his/her understanding and acceptance.   Dental advisory given  Plan Discussed with: Anesthesiologist, Surgeon and CRNA  Anesthesia Plan Comments:        Anesthesia Quick Evaluation

## 2013-09-18 NOTE — Transfer of Care (Signed)
Immediate Anesthesia Transfer of Care Note  Patient: DICKSON BARFIELD  Procedure(s) Performed: Procedure(s) with comments: INSERTION OF IMPLANTABLE LEFT VENTRICULAR ASSIST DEVICE (N/A) - CIRC ARREST  NITRIC OXIDE INTRAOPERATIVE TRANSESOPHAGEAL ECHOCARDIOGRAM (N/A)  Patient Location: SICU  Anesthesia Type:General  Level of Consciousness: Patient remains intubated per anesthesia plan  Airway & Oxygen Therapy: Patient remains intubated per anesthesia plan and Patient placed on Ventilator (see vital sign flow sheet for setting)  Post-op Assessment: Report given to PACU RN and Post -op Vital signs reviewed and stable  Post vital signs: Reviewed and stable  Complications: No apparent anesthesia complications

## 2013-09-18 NOTE — OR Nursing (Signed)
13:35 - 1st call to SICU charge nurse

## 2013-09-18 NOTE — Progress Notes (Signed)
Patient was transported to the OR with the help of CRNA Morey Hummingbird, [place], and a perfutionist in stable condition. Patient belongings sent to 2300.

## 2013-09-18 NOTE — OR Nursing (Signed)
14:35 - 2nd call to SICU charge nurse

## 2013-09-18 NOTE — Progress Notes (Signed)
Unit social worker received consult for VAD patient. CSW will refer this to the social worker who will follow patient and help assist. 2S unit social worker signing off at this time.  Jeanette Caprice, MSW, [place]

## 2013-09-18 NOTE — Anesthesia Postprocedure Evaluation (Signed)
  Anesthesia Post-op Note  Patient: Johnny Oneill  Procedure(s) Performed: Procedure(s) with comments: INSERTION OF IMPLANTABLE LEFT VENTRICULAR ASSIST DEVICE (N/A) - CIRC ARREST  NITRIC OXIDE INTRAOPERATIVE TRANSESOPHAGEAL ECHOCARDIOGRAM (N/A)  Patient Location: SICU  Anesthesia Type:General  Level of Consciousness: sedated, unresponsive and Patient remains intubated per anesthesia plan  Airway and Oxygen Therapy: Patient remains intubated per anesthesia plan and Patient placed on Ventilator (see vital sign flow sheet for setting)  Post-op Pain: none  Post-op Assessment: Post-op Vital signs reviewed, Patient's Cardiovascular Status Stable, Respiratory Function Stable, Patent Airway and Pain level controlled  Post-op Vital Signs: stable  Complications: No apparent anesthesia complications

## 2013-09-19 ENCOUNTER — Inpatient Hospital Stay (HOSPITAL_COMMUNITY): Payer: Medicare Other

## 2013-09-19 DIAGNOSIS — S2190XA Unspecified open wound of unspecified part of thorax, initial encounter: Secondary | ICD-10-CM

## 2013-09-19 DIAGNOSIS — S271XXA Traumatic hemothorax, initial encounter: Secondary | ICD-10-CM

## 2013-09-19 DIAGNOSIS — Z95818 Presence of other cardiac implants and grafts: Secondary | ICD-10-CM

## 2013-09-19 DIAGNOSIS — I5023 Acute on chronic systolic (congestive) heart failure: Secondary | ICD-10-CM

## 2013-09-19 LAB — CBC WITH DIFFERENTIAL/PLATELET
Basophils Absolute: 0 10*3/uL (ref 0.0–0.1)
Basophils Relative: 0 % (ref 0–1)
Eosinophils Absolute: 0 10*3/uL (ref 0.0–0.7)
Eosinophils Relative: 0 % (ref 0–5)
HCT: 24.1 % — ABNORMAL LOW (ref 39.0–52.0)
Hemoglobin: 8.1 g/dL — ABNORMAL LOW (ref 13.0–17.0)
Lymphocytes Relative: 2 % — ABNORMAL LOW (ref 12–46)
Lymphs Abs: 0.3 10*3/uL — ABNORMAL LOW (ref 0.7–4.0)
MCH: 27.6 pg (ref 26.0–34.0)
MCHC: 33.6 g/dL (ref 30.0–36.0)
MCV: 82 fL (ref 78.0–100.0)
Monocytes Absolute: 0.7 10*3/uL (ref 0.1–1.0)
Monocytes Relative: 5 % (ref 3–12)
Neutro Abs: 12.8 10*3/uL — ABNORMAL HIGH (ref 1.7–7.7)
Neutrophils Relative %: 93 % — ABNORMAL HIGH (ref 43–77)
Platelets: 158 10*3/uL (ref 150–400)
RBC: 2.94 MIL/uL — ABNORMAL LOW (ref 4.22–5.81)
RDW: 15.9 % — ABNORMAL HIGH (ref 11.5–15.5)
WBC: 13.8 10*3/uL — ABNORMAL HIGH (ref 4.0–10.5)

## 2013-09-19 LAB — CBC
HCT: 23 % — ABNORMAL LOW (ref 39.0–52.0)
Hemoglobin: 7.9 g/dL — ABNORMAL LOW (ref 13.0–17.0)
MCH: 28 pg (ref 26.0–34.0)
MCHC: 34.3 g/dL (ref 30.0–36.0)
MCV: 81.6 fL (ref 78.0–100.0)
Platelets: 132 10*3/uL — ABNORMAL LOW (ref 150–400)
RBC: 2.82 MIL/uL — ABNORMAL LOW (ref 4.22–5.81)
RDW: 16.4 % — ABNORMAL HIGH (ref 11.5–15.5)
WBC: 10.2 10*3/uL (ref 4.0–10.5)

## 2013-09-19 LAB — BASIC METABOLIC PANEL
BUN: 25 mg/dL — ABNORMAL HIGH (ref 6–23)
BUN: 32 mg/dL — ABNORMAL HIGH (ref 6–23)
CO2: 22 mEq/L (ref 19–32)
CO2: 22 mEq/L (ref 19–32)
Calcium: 8.2 mg/dL — ABNORMAL LOW (ref 8.4–10.5)
Calcium: 8.9 mg/dL (ref 8.4–10.5)
Chloride: 102 mEq/L (ref 96–112)
Chloride: 104 mEq/L (ref 96–112)
Creatinine, Ser: 1.47 mg/dL — ABNORMAL HIGH (ref 0.50–1.35)
Creatinine, Ser: 1.83 mg/dL — ABNORMAL HIGH (ref 0.50–1.35)
GFR calc Af Amer: 54 mL/min — ABNORMAL LOW (ref >90)
GFR calc Af Amer: 41 mL/min — ABNORMAL LOW (ref >90)
GFR calc non Af Amer: 47 mL/min — ABNORMAL LOW (ref >90)
GFR calc non Af Amer: 36 mL/min — ABNORMAL LOW (ref >90)
Glucose, Bld: 112 mg/dL — ABNORMAL HIGH (ref 70–99)
Glucose, Bld: 100 mg/dL — ABNORMAL HIGH (ref 70–99)
Potassium: 5 mEq/L (ref 3.7–5.3)
Potassium: 4.9 mEq/L (ref 3.7–5.3)
Sodium: 140 mEq/L (ref 137–147)
Sodium: 142 mEq/L (ref 137–147)

## 2013-09-19 LAB — PREPARE FRESH FROZEN PLASMA
Unit division: 0
Unit division: 0
Unit division: 0
Unit division: 0
Unit division: 0
Unit division: 0

## 2013-09-19 LAB — PREPARE PLATELET PHERESIS
Unit division: 0
Unit division: 0
Unit division: 0

## 2013-09-19 LAB — GLUCOSE, CAPILLARY
Glucose-Capillary: 109 mg/dL — ABNORMAL HIGH (ref 70–99)
Glucose-Capillary: 118 mg/dL — ABNORMAL HIGH (ref 70–99)
Glucose-Capillary: 119 mg/dL — ABNORMAL HIGH (ref 70–99)
Glucose-Capillary: 128 mg/dL — ABNORMAL HIGH (ref 70–99)
Glucose-Capillary: 130 mg/dL — ABNORMAL HIGH (ref 70–99)
Glucose-Capillary: 130 mg/dL — ABNORMAL HIGH (ref 70–99)
Glucose-Capillary: 139 mg/dL — ABNORMAL HIGH (ref 70–99)
Glucose-Capillary: 144 mg/dL — ABNORMAL HIGH (ref 70–99)

## 2013-09-19 LAB — HEPATIC FUNCTION PANEL
ALT: 162 U/L — ABNORMAL HIGH (ref 0–53)
AST: 428 U/L — ABNORMAL HIGH (ref 0–37)
Albumin: 3.1 g/dL — ABNORMAL LOW (ref 3.5–5.2)
Alkaline Phosphatase: 69 U/L (ref 39–117)
Bilirubin, Direct: 2.1 mg/dL — ABNORMAL HIGH (ref 0.0–0.3)
Indirect Bilirubin: 2.2 mg/dL — ABNORMAL HIGH (ref 0.3–0.9)
Total Bilirubin: 4.3 mg/dL — ABNORMAL HIGH (ref 0.3–1.2)
Total Protein: 5.7 g/dL — ABNORMAL LOW (ref 6.0–8.3)

## 2013-09-19 LAB — POCT I-STAT 3, ART BLOOD GAS (G3+)
Acid-base deficit: 1 mmol/L (ref 0.0–2.0)
Acid-base deficit: 1 mmol/L (ref 0.0–2.0)
Acid-base deficit: 3 mmol/L — ABNORMAL HIGH (ref 0.0–2.0)
Bicarbonate: 24 mEq/L (ref 20.0–24.0)
Bicarbonate: 24.3 mEq/L — ABNORMAL HIGH (ref 20.0–24.0)
Bicarbonate: 24.6 mEq/L — ABNORMAL HIGH (ref 20.0–24.0)
Bicarbonate: 22.6 mEq/L (ref 20.0–24.0)
O2 Saturation: 96 %
O2 Saturation: 93 %
O2 Saturation: 92 %
O2 Saturation: 92 %
Patient temperature: 37.3
Patient temperature: 37.8
Patient temperature: 38.1
Patient temperature: 38.1
TCO2: 25 mmol/L (ref 0–100)
TCO2: 26 mmol/L (ref 0–100)
TCO2: 26 mmol/L (ref 0–100)
TCO2: 24 mmol/L (ref 0–100)
pCO2 arterial: 42 mmHg (ref 35.0–45.0)
pCO2 arterial: 43.8 mmHg (ref 35.0–45.0)
pCO2 arterial: 42.8 mmHg (ref 35.0–45.0)
pCO2 arterial: 41.3 mmHg (ref 35.0–45.0)
pH, Arterial: 7.366 (ref 7.350–7.450)
pH, Arterial: 7.355 (ref 7.350–7.450)
pH, Arterial: 7.373 (ref 7.350–7.450)
pH, Arterial: 7.35 (ref 7.350–7.450)
pO2, Arterial: 87 mmHg (ref 80.0–100.0)
pO2, Arterial: 74 mmHg — ABNORMAL LOW (ref 80.0–100.0)
pO2, Arterial: 68 mmHg — ABNORMAL LOW (ref 80.0–100.0)
pO2, Arterial: 72 mmHg — ABNORMAL LOW (ref 80.0–100.0)

## 2013-09-19 LAB — CARBOXYHEMOGLOBIN
Carboxyhemoglobin: 2.5 % — ABNORMAL HIGH (ref 0.5–1.5)
Carboxyhemoglobin: 1.9 % — ABNORMAL HIGH (ref 0.5–1.5)
Carboxyhemoglobin: 1.8 % — ABNORMAL HIGH (ref 0.5–1.5)
Methemoglobin: 2.4 % — ABNORMAL HIGH (ref 0.0–1.5)
Methemoglobin: 1.3 % (ref 0.0–1.5)
Methemoglobin: 1.5 % (ref 0.0–1.5)
O2 Saturation: 57.3 %
O2 Saturation: 50.9 %
O2 Saturation: 53.2 %
Total hemoglobin: 8.1 g/dL — ABNORMAL LOW (ref 13.5–18.0)
Total hemoglobin: 7.6 g/dL — ABNORMAL LOW (ref 13.5–18.0)
Total hemoglobin: 9 g/dL — ABNORMAL LOW (ref 13.5–18.0)

## 2013-09-19 LAB — MAGNESIUM
Magnesium: 2.7 mg/dL — ABNORMAL HIGH (ref 1.5–2.5)
Magnesium: 2.5 mg/dL (ref 1.5–2.5)

## 2013-09-19 LAB — PROTIME-INR
INR: 1.45 (ref 0.00–1.49)
Prothrombin Time: 17.3 seconds — ABNORMAL HIGH (ref 11.6–15.2)

## 2013-09-19 LAB — POCT ACTIVATED CLOTTING TIME: Activated Clotting Time: 110 seconds

## 2013-09-19 LAB — PREPARE RBC (CROSSMATCH)

## 2013-09-19 LAB — PHOSPHORUS: Phosphorus: 5.1 mg/dL — ABNORMAL HIGH (ref 2.3–4.6)

## 2013-09-19 LAB — LACTATE DEHYDROGENASE: LDH: 766 U/L — ABNORMAL HIGH (ref 94–250)

## 2013-09-19 LAB — CALCIUM, IONIZED: Calcium, Ion: 1.13 mmol/L (ref 1.13–1.30)

## 2013-09-19 MED ORDER — DEXMEDETOMIDINE HCL IN NACL 400 MCG/100ML IV SOLN
0.1000 ug/kg/h | INTRAVENOUS | Status: DC
  Administered 2013-09-19 (×3): 0.7 ug/kg/h via INTRAVENOUS
  Filled 2013-09-19 (×2): qty 100

## 2013-09-19 MED ORDER — BIOTENE DRY MOUTH MT LIQD
15.0000 mL | Freq: Four times a day (QID) | OROMUCOSAL | Status: DC
  Administered 2013-09-19 – 2013-09-23 (×11): 15 mL via OROMUCOSAL

## 2013-09-19 MED ORDER — INSULIN ASPART 100 UNIT/ML ~~LOC~~ SOLN
0.0000 [IU] | SUBCUTANEOUS | Status: DC
Start: 2013-09-19 — End: 2013-09-23
  Administered 2013-09-19: 2 [IU] via SUBCUTANEOUS
  Administered 2013-09-20: 4 [IU] via SUBCUTANEOUS
  Administered 2013-09-20 (×2): 2 [IU] via SUBCUTANEOUS
  Administered 2013-09-20 (×4): 4 [IU] via SUBCUTANEOUS
  Administered 2013-09-21 – 2013-09-23 (×4): 2 [IU] via SUBCUTANEOUS

## 2013-09-19 MED ORDER — METHYLPREDNISOLONE SODIUM SUCC 125 MG IJ SOLR
80.0000 mg | Freq: Every day | INTRAMUSCULAR | Status: AC
  Administered 2013-09-19: 80 mg via INTRAVENOUS
  Filled 2013-09-19: qty 1.28

## 2013-09-19 MED ORDER — CALCIUM CHLORIDE 10 % IV SOLN
1.0000 g | Freq: Once | INTRAVENOUS | Status: AC
  Administered 2013-09-19: 1 g via INTRAVENOUS

## 2013-09-19 MED ORDER — FUROSEMIDE 10 MG/ML IJ SOLN
20.0000 mg | Freq: Once | INTRAMUSCULAR | Status: AC
  Administered 2013-09-19: 20 mg via INTRAVENOUS

## 2013-09-19 MED ORDER — MILRINONE IN DEXTROSE 20 MG/100ML IV SOLN
0.1000 ug/kg/min | INTRAVENOUS | Status: DC
  Administered 2013-09-19 (×2): 0.1 ug/kg/min via INTRAVENOUS
  Filled 2013-09-19: qty 100

## 2013-09-19 MED ORDER — FUROSEMIDE 10 MG/ML IJ SOLN
INTRAMUSCULAR | Status: AC
  Filled 2013-09-19: qty 4

## 2013-09-19 MED ORDER — LEVALBUTEROL HCL 1.25 MG/0.5ML IN NEBU
1.2500 mg | INHALATION_SOLUTION | Freq: Four times a day (QID) | RESPIRATORY_TRACT | Status: DC
  Administered 2013-09-19 – 2013-09-23 (×17): 1.25 mg via RESPIRATORY_TRACT
  Filled 2013-09-19 (×21): qty 0.5

## 2013-09-19 MED ORDER — DEXMEDETOMIDINE HCL IN NACL 400 MCG/100ML IV SOLN
0.1000 ug/kg/h | INTRAVENOUS | Status: DC
  Filled 2013-09-19: qty 100

## 2013-09-19 MED ORDER — LEVALBUTEROL HCL 1.25 MG/0.5ML IN NEBU
1.2500 mg | INHALATION_SOLUTION | Freq: Four times a day (QID) | RESPIRATORY_TRACT | Status: DC
  Filled 2013-09-19 (×4): qty 0.5

## 2013-09-19 MED ORDER — CHLORHEXIDINE GLUCONATE 0.12 % MT SOLN
15.0000 mL | Freq: Two times a day (BID) | OROMUCOSAL | Status: DC
  Administered 2013-09-19 – 2013-09-22 (×5): 15 mL via OROMUCOSAL
  Filled 2013-09-19 (×5): qty 15

## 2013-09-19 MED ORDER — BUDESONIDE-FORMOTEROL FUMARATE 160-4.5 MCG/ACT IN AERO
2.0000 | INHALATION_SPRAY | Freq: Two times a day (BID) | RESPIRATORY_TRACT | Status: DC
  Administered 2013-09-19 – 2013-09-26 (×14): 2 via RESPIRATORY_TRACT
  Filled 2013-09-19: qty 6

## 2013-09-19 MED ORDER — ALBUMIN HUMAN 5 % IV SOLN
12.5000 g | Freq: Once | INTRAVENOUS | Status: AC
  Administered 2013-09-19: 12.5 g via INTRAVENOUS

## 2013-09-19 MED FILL — Electrolyte-R (PH 7.4) Solution: INTRAVENOUS | Qty: 4000 | Status: AC

## 2013-09-19 MED FILL — Lidocaine HCl IV Inj 20 MG/ML: INTRAVENOUS | Qty: 5 | Status: AC

## 2013-09-19 MED FILL — Heparin Sodium (Porcine) Inj 1000 Unit/ML: INTRAMUSCULAR | Qty: 30 | Status: AC

## 2013-09-19 MED FILL — Mannitol IV Soln 20%: INTRAVENOUS | Qty: 500 | Status: AC

## 2013-09-19 MED FILL — Potassium Chloride Inj 2 mEq/ML: INTRAVENOUS | Qty: 40 | Status: AC

## 2013-09-19 MED FILL — Sodium Chloride IV Soln 0.9%: INTRAVENOUS | Qty: 4000 | Status: AC

## 2013-09-19 MED FILL — Heparin Sodium (Porcine) Inj 1000 Unit/ML: INTRAMUSCULAR | Qty: 60 | Status: AC

## 2013-09-19 MED FILL — Magnesium Sulfate Inj 50%: INTRAMUSCULAR | Qty: 10 | Status: AC

## 2013-09-19 MED FILL — Sodium Bicarbonate IV Soln 8.4%: INTRAVENOUS | Qty: 50 | Status: AC

## 2013-09-19 MED FILL — Calcium Chloride Inj 10%: INTRAVENOUS | Qty: 10 | Status: AC

## 2013-09-19 NOTE — Progress Notes (Signed)
Evening Rounds:  Patient's co-ox down to 50% and now on levophed. MAP 60s will titrate levophed to keep MAP>70 and repeat co-ox at 2000. Patient remains intubated. Nitric oxide weaned down to 10ppm.  Junie Bame B NP-C 4:30 PM

## 2013-09-19 NOTE — Progress Notes (Signed)
8 FR sheath/intraortic balloon pump removed from right groin without difficulty.  Manual pressure applied for 20 minutes. No hematoma noted, site soft, non-tender, no bruising noted.  Dry gauze applied with tape. Distal pulses audible with doppler.  Alice, RN in to check site.

## 2013-09-19 NOTE — Progress Notes (Signed)
INITIAL NUTRITION ASSESSMENT  DOCUMENTATION CODES Per approved criteria  -Not Applicable   INTERVENTION:  If EN started, recommend Vital 1.5 formula -- initiate at 20 ml/hr and increase by 10 ml every 4 hours to goal rate of 40 ml/hr with Prostat liquid protein 30 ml QID via tube to provide 1840 kcals, 125 gm protein, 735 ml of free water RD to follow for nutrition care plan  NUTRITION DIAGNOSIS: Inadequate oral intake related to inability to eat as evidenced by NPO status  Goal: Pt to meet >/= 90% of their estimated nutrition needs   Monitor:  EN initiation & tolerance, respiratory status, weight, labs, I/O's  Reason for Assessment: VDRF  73 y.o. male  Admitting Dx: ischemic cardiomyopathy  ASSESSMENT: Patient with PMH of CAD, chronic systolic heart failure, ICD 2009, PVCs, hypothyroidism, hyperlipidemia, and arthritis; admitted for ICM and LVAD work up.   Patient s/p procedure 1/19: HeartMate II LVAD IMPLANTATION   Patient is currently intubated on ventilator support -- OGT in place MV: 9.1 L/min Temp (24hrs), Avg:98.1 F (36.7 C), Min:96.8 F (36 C), Max:100.4 F (38 C)   Right chest tube placed this AM due to post-op hemothorax.  Plan is to try to wean NO slowly and extubate patient this afternoon.   Height: Ht Readings from Last 1 Encounters:  09/17/13 5\' 7"  (1.702 m)    Weight: Wt Readings from Last 1 Encounters:  09/19/13 174 lb 9.7 oz (79.2 kg)    Ideal Body Weight: 148 lb  % Ideal Body Weight: 103%  Wt Readings from Last 10 Encounters:  09/19/13 174 lb 9.7 oz (79.2 kg)  09/19/13 174 lb 9.7 oz (79.2 kg)  09/19/13 174 lb 9.7 oz (79.2 kg)  09/19/13 174 lb 9.7 oz (79.2 kg)  09/19/13 174 lb 9.7 oz (79.2 kg)  09/11/13 163 lb 12.8 oz (74.299 kg)  08/25/13 160 lb (72.576 kg)  08/25/13 160 lb (72.576 kg)  08/22/13 163 lb 8 oz (74.163 kg)  07/24/13 166 lb (75.297 kg)    Usual Body Weight: 163 lb  % Usual Body Weight: 106%  BMI:  Body mass index  is 27.34 kg/(m^2).   Estimated Nutritional Needs (based on UBW): Kcal: 1800-1950 Protein: 120-130 gm Fluid: per MD  Skin: chest & abdominal surgical incision   Diet Order: NPO  EDUCATION NEEDS: -No education needs identified at this time   Intake/Output Summary (Last 24 hours) at 09/19/13 1125 Last data filed at 09/19/13 1100  Gross per 24 hour  Intake 9088.16 ml  Output   4310 ml  Net 4778.16 ml    Labs:   Recent Labs Lab 09/18/13 0326  09/18/13 1518 09/18/13 1535 09/18/13 2117 09/18/13 2118 09/19/13 0350  NA 136*  < > 141 141 141  --  140  K 4.0  < > 3.4* 3.2* 4.4  --  5.0  CL 100  --  104  --  103  --  102  CO2 23  --  25  --   --   --  22  BUN 19  --  18  --  22  --  25*  CREATININE 1.40*  --  1.14  --  1.40* 1.27 1.47*  CALCIUM 8.4  --  7.7*  --   --   --  8.2*  MG  --   --  1.9  --   --  3.2* 2.7*  PHOS  --   --   --   --   --   --  5.1*  GLUCOSE 116*  < > 99 98 118*  --  112*  < > = values in this interval not displayed.  CBG (last 3)   Recent Labs  09/19/13 0137 09/19/13 0240 09/19/13 0349  GLUCAP 128* 130* 109*    Scheduled Meds: . acetaminophen  1,000 mg Oral Q6H   Or  . acetaminophen (TYLENOL) oral liquid 160 mg/5 mL  1,000 mg Per Tube Q6H  . aspirin EC  325 mg Oral Daily   Or  . aspirin  324 mg Per Tube Daily   Or  . aspirin  300 mg Rectal Daily  . bisacodyl  10 mg Oral Daily   Or  . bisacodyl  10 mg Rectal Daily  . budesonide-formoterol  2 puff Inhalation BID  . chlorhexidine  15 mL Mouth/Throat BID  . docusate sodium  200 mg Oral Daily  . ezetimibe  10 mg Oral Daily  . insulin regular  0-10 Units Intravenous TID WC  . levalbuterol  1.25 mg Nebulization Q6H  . levothyroxine  150 mcg Oral Custom   And  . levothyroxine  75 mcg Oral Custom  . metoCLOPramide (REGLAN) injection  10 mg Intravenous Q6H  . pantoprazole (PROTONIX) IV  40 mg Intravenous Q24H  . piperacillin-tazobactam (ZOSYN)  IV  3.375 g Intravenous Q8H  . sodium  chloride  3 mL Intravenous Q12H  . vancomycin (VANCOCIN) 750 mg IVPB  750 mg Intravenous Q12H    Continuous Infusions: . sodium chloride 20 mL/hr at 09/19/13 0400  . sodium chloride 10 mL/hr at 09/18/13 2000  . sodium chloride    . dexmedetomidine 0.7 mcg/kg/hr (09/19/13 1100)  . DOPamine 3 mcg/kg/min (09/19/13 1100)  . EPINEPHrine infusion 3 mcg/min (09/19/13 1100)  . insulin (NOVOLIN-R) infusion 1.7 Units/hr (09/18/13 1914)  . lactated ringers 20 mL/hr at 09/19/13 0809  . milrinone 0.1 mcg/kg/min (09/19/13 1100)  . norepinephrine (LEVOPHED) Adult infusion Stopped (09/19/13 0300)    Past Medical History  Diagnosis Date  . Ischemic cardiomyopathy     a. 10/09 Echo: Sev LV Dysfxn, inf/lat AK, Mod MR.  [name];  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 Hypertension   . PVC (premature ventricular contraction)   . Pacemaker   . CHF (congestive heart failure)   . Shortness of breath   . Automatic implantable cardioverter-defibrillator in situ     Past Surgical History  Procedure Laterality Date  . Defibrillator  2009  . Total knee arthroplasty  11/27/11    left  . Insert / replace / remove pacemaker  2009  . Coronary angioplasty with stent placement  2004  . Coronary angioplasty with stent placement  07/2008  . Knee arthroscopy w/ partial medial meniscectomy  12/2002    left  . Knee arthroscopy      bilaterally  . Total knee arthroplasty  11/27/2011    Procedure: TOTAL KNEE ARTHROPLASTY;  Surgeon: Yvette Rack., MD;  Location: [place];  Service: Orthopedics;  Laterality: Left;  left total knee arthroplasty  .  Esophagogastroduodenoscopy N/A  09/15/2013    Procedure: ESOPHAGOGASTRODUODENOSCOPY (EGD);  Surgeon: Ladene Artist, MD;  Location: [facility] ENDOSCOPY;  Service: Endoscopy;  Laterality: N/A;  bedside    Arthur Holms, RD, LDN Pager #: [phone] After-Hours Pager #: [phone]

## 2013-09-19 NOTE — Op Note (Signed)
NAME:  Johnny Oneill, Johnny Oneill NO.:  [id number]  MEDICAL RECORD NO.:  [id number]  LOCATION:                               FACILITY:  [facility]  PHYSICIAN:  Ivin Poot, M.D.  DATE OF BIRTH:  December 26, 1940  DATE OF PROCEDURE:  09/18/2013 DATE OF DISCHARGE:                              OPERATIVE REPORT   OPERATION:  Implantation of left ventricular assist device (HeartMate II).  PREOPERATIVE DIAGNOSIS:  Advanced heart failure from ischemic cardiomyopathy.  POSTOPERATIVE DIAGNOSIS:  Advanced heart failure from ischemic cardiomyopathy.  SURGEON:  Ivin Poot, M.D.  ASSISTANT:  John Giovanni, P.A.-C.  ANESTHESIA:  General by Dr. Glynda Jaeger.  INDICATION:  The patient is a 73 year old Caucasian male with ischemic cardiomyopathy.  He was evaluated for advanced heart failure with EF of 15% in April 2015 and was placed on milrinone home therapy.  He was subsequently evaluated by [facility] for cardiac transplantation, and was felt to be a candidate, and was placed under active waiting list in the spring of 2014.  The patient has continued to be followed closely in the advanced [facility], but recently had a definite functional decline and was admitted for low cardiac output, and acute on chronic heart failure.  He required placement of Swan-Ganz catheter, increased dose of milrinone and then subsequent placement of a balloon pump to maintain adequate hemodynamics.  Because of the patient's rapidly advancing heart failure or clinical deterioration, he is felt to be a candidate for LVAD placement as a bridge to transplantation.  This was discussed with his transplant team at [facility] and approved.  Prior to surgery, I reviewed the results of the patient's recent echo and his other x-ray and laboratory values.  I discussed the procedure of implantation of a left ventricular assist device.  He understands this is being placed as a bridge to transplantation.  He  understands why we are implanting the LVAD.  I discussed the major operative issues with the patient including the location of the surgical incisions, the use of general anesthesia and cardiopulmonary bypass, and the expected postoperative hospital recovery.  I reviewed with the patient the risks to him of the operation including risks of stroke, MI, bleeding, blood transfusion requirement, infection, renal failure, arrhythmias, and death.  After reviewing these issues, he demonstrated his understanding and agreed to proceed with surgery under what I felt was an informed consent.  The patient understood that because of his preoperative anemia, which had required transfusion that he would be expected to receive blood products for implantation of the LVAD.  FINDINGS: 1. Adequate RV function with only mild tricuspid regurgitation by     transesophageal echo, and a tricuspid valve anulus of less than 40     mm. 2. Improved hemodynamics after implantation of LVAD. 3. Intraoperative coagulation disorder-coagulopathy requiring     platelets and FFP.  OPERATIVE PROCEDURE:  The patient was brought to the operating room and placed supine on the operating table where general anesthesia was induced under invasive hemodynamic monitoring.  A transesophageal echo probe was placed by the anesthesiologist.  This confirmed the preoperative diagnosis of severe LV dysfunction, severe mitral  regurgitation, and only mild tricuspid regurgitation was noted.  The patient was prepped and draped as a sterile field.  The balloon pump was prepped out of the field.  A proper time-out was performed.  A sternal incision was made and the sternum was divided with the saw.  The left-sided sternum was elevated as was the upper abdomen for creation of a pocket for the LVAD pump. The left hemidiaphragm was taken down and a generous pocket was created. Hemostasis was adequate.  A small incision was made above into the  right of the umbilicus for driveline tunnel.  A circular incision was made in the right upper quadrant below the ribs for exit of the driveline.  The tunneling devices were placed through these two incisions and left in place.  The pericardium was suspended.  Pursestrings were placed in the ascending aorta and right atrium.  The patient was dosed with full-dose heparin.  When the ACT was documented as being therapeutic, the ascending aorta was clamped with a partial occluding clamp for sewing the outflow graft.  The outflow graft was cut to the appropriate length and beveled and an arteriotomy was made in the ascending aorta with partial clamp in place.  The outflow graft was then sewn end-to-side with a running 4-0 Prolene technique and reinforced with a light layer of CoSeal.  The partial clamp was removed as the graft was clamped with a vascular clamp and placed to the side.  The patient was then placed on cardiopulmonary bypass.  The heart was inspected.  The LV apex dimple was identified.  An incision was made into the LV apex such that there would be room to place the inflow cannulation sutures without compromising the LAD.  The cutting device was used and the myocardium was removed.  The myocardium was notably soft and friable.  The trabecula and the apex were carefully trimmed and a suction catheter was placed in the LV apex.  The surgical field was insufflated with CO2.  A 2-0 Ethibond sutures with pledgets were then placed circumferentially around the opening in the LV apex.  These were then sewn to the Silastic cuff and the sutures were carefully tied.  The myocardium was soft and care was taken to avoid tearing the myocardium in the process of applying the knots.  A small baby size right angle was used to make sure there was no spaces in between the sutures on the Dacron cuff of the Silastic introducer.  A light layer of CoSeal was placed around, the pump was then brought  to the field, and outflow elbow was hand tightened.  Volume was left into the patient and into the heart as the lungs were expanded, and the inflow cannula of the pump was inserted into the Silastic sleeve and then secured with the Tevdek suture within the Silastic as well as a tie- band and then heavy silk sutures above and below the tie-band.  Next, the driveline was brought through the chest into the mid abdominal contour incision and then back out the exit site.  A loop was placed in the driveline beneath the pump in order to keep all the lower within the patient's tissue.  Next, the pump was connected to the outflow cannula after again filling the pump with extra volume to avoid the air entrapment.  The outflow connection was hand tightened and a 20-gauge needle was placed between the pump and the vascular clamp on the Hemashield graft. The pump was then placed in  the pocket and the driveline was connected to the controller and the pump speed was started at 6000 rpm while the clamp was still on the outflow graft.  At this point, it was apparent there were some bleeding from the superior and medial aspect of the interrupted sutures for the inflow cannula.  The apex of the heart was brought back into the field for exposure and several interrupted pledgeted sutures were required to control bleeding using both the 2-0 Ethibond as well as 2-0 Prolene sutures.  A light layer of medical adhesive was then placed over the extra sutures and the apex was gently was placed back into the pericardium.  Volume was then left into the heart as well as low-dose pressors.  RV function appeared to be good, so we increase the speed on the LVAD to 7000 and removed the clamp on the Dacron graft.  Slowly, we transitioned from cardiopulmonary bypass to full LVAD support at  a rpm of 8600. Echo showed good RV function and good emptying of the LV and there was no significant change in the mild TR.  There  was minimal opening of the aortic valve, and there was minimal bleeding from the anterior LV, which had been repaired by keeping the LV fairly well decompressed.  Protamine was administered slowly without adverse reaction.  The platelet count was low and platelets and FFP were administered.  The patient remained hemodynamically stable.  We spent a considerable amount of time drying up and there was evidence of coagulopathy, but this did improve with platelets and FFP.  The ouflow elbow was secured to the posterior rectus sheath using a large Ethibond suture.  The pacing wires were placed to the right atrially paced the heart.  After adequate hemostasis had been obtained and the right pleural space was opened and drained.  The drainage tubes were placed including a 36 tube in the left pleural space, a 36 angled tube in the posterior mediastinum, a 32-French Bard in the anterior mediastinum and a 28- [place] Silastic tube in the LVAD pocket.  These were all secured to the skin.  The chest was then closed with interrupted steel wire.  The pectoralis fascia was closed interrupted Vicryl on the lower part of the incision and running #1 Vicryl in the mid upper fashion.  The subcutaneous and skin layers were closed in running Vicryl.  The contour incision in the right mid abdomen was closed with interrupted 3-0 Vicryl and a subcuticular suture for the skin.  The drive line exit site was closed with 2 interrupted subcutaneous Vicryl and 2 nylon skin sutures. Sterile dressings were applied.  The patient had a balloon pump placed preoperatively and this was kept at a ratio of 1-3 with slight reduction and augmentation, so it would not interfere with LVAD flow.  Sterile dressings were applied.  The patient was transferred back to the ICU. His cardiac output was 4-5 L and his saturation was 63%.     Ivin Poot, M.D.     PV/MEDQ  D:  09/18/2013  T:  09/19/2013  Job:  [id number]

## 2013-09-19 NOTE — Progress Notes (Signed)
Brief Op note  R chest tube placed for mod R effusion- hemothorax Anesth- local 1% lidocaine 300 cc bloody fluid drained, CXR improved

## 2013-09-19 NOTE — Brief Op Note (Signed)
09/13/2013 - 09/18/2013  6:17 AM  PATIENT:  Johnny Oneill  73 y.o. male  PRE-OPERATIVE DIAGNOSIS:  ICM  POST-OPERATIVE DIAGNOSIS:  ICM  PROCEDURE:  LVAD implantation  SURGEON:  Surgeon(s) and Role:    * Ivin Poot, MD - Primary  PHYSICIAN ASSISTANT: WG  ASSISTANTS: Evonnie Pat PA-C   ANESTHESIA:   general, TEE showed only mild TR  EBL:  Total I/O In: 1796.5 [I.V.:605.5; Blood:753.5; IV Piggyback:437.5] Out: 1345 [Urine:935; Chest Tube:410]  BLOOD ADMINISTERED:2 PRBC 2 FFP 2 Platelets   DRAINS: 2 Chest Tube(s) in the mediastinum, one in pump pocket and L pleural space   LOCAL MEDICATIONS USED:  NONE  SPECIMEN:  Source of Specimen:  LV apex  DISPOSITION OF SPECIMEN:  N/A  COUNTS:  YES  TOURNIQUET:  * No tourniquets in log *  DICTATION: .Dragon Dictation  PLAN OF CARE: Admit to inpatient   PATIENT DISPOSITION:  ICU - intubated and hemodynamically stable.   Delay start of Pharmacological VTE agent (>24hrs) due to surgical blood loss or risk of bleeding yes

## 2013-09-19 NOTE — Progress Notes (Signed)
Attempted to obtain sputum sample x 2. No secretions when suctioned.

## 2013-09-19 NOTE — Op Note (Signed)
NAME:  Johnny Oneill, Johnny Oneill NO.:  [id number]  MEDICAL RECORD NO.:  [id number]  LOCATION:  2S10C                        FACILITY:  [facility]  PHYSICIAN:  Ivin Poot, M.D.  DATE OF BIRTH:  08/14/1941  DATE OF PROCEDURE:  09/19/2013 DATE OF DISCHARGE:                              OPERATIVE REPORT   OPERATION:  Right chest tube placement.  PREOPERATIVE DIAGNOSIS:  Postoperative right hemothorax after left ventricular assist device implantation.  POSTOPERATIVE DIAGNOSIS:  Postoperative right hemothorax after left ventricular assist device implantation.  ANESTHESIA:  Local 1% lidocaine with IV conscious sedation, monitored.  INDICATIONS:  The patient is 72 years old.  Sixteen hours ago, he completed implantation of a left ventricular assist device for advanced heart failure, ischemic cardiomyopathy, and cardiogenic shock, balloon pump dependent.  He developed a coagulopathy.  For the course of the night after surgery, he developed a moderate right pleural effusion. This was consistent with hemothorax as he had a drop in hematocrit.  I reviewed the x-rays and recommended urgent-emergent placement of right chest tube.  OPERATIVE PROCEDURE:  A proper time-out was performed.  The right chest was prepped and draped.  Lidocaine was infiltrated in the fifth interspace.  IV Versed and morphine were administered that the patient was already on the ventilator and sedated.  Small incision was made.  More lidocaine was infiltrated down to the intercostal muscle, the fifth interspace.  Through that, a 28-French chest tube was placed and directed posteriorly to drain the fluid. Serosanguineous fluid drained immediately approximately 300 mL.  The chest tube was secured to the skin, connected to an underwater seal Pleur-Evac drainage system and chest x-ray was ordered.  This showed improved aeration of the right lung.     Ivin Poot, M.D.     PV/MEDQ  D:  09/19/2013   T:  09/19/2013  Job:  [id number]

## 2013-09-19 NOTE — Progress Notes (Signed)
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 S/P L TKR 2013.   Admitted for VAD work up. Swan placed. IABP placed due to low CO-OX and worsening renal function.   POD #1 LVAD implantation for BTT. LV was friable and soft causing lots of bleeding. Received DDAVP last night along with 3 PRBC and 1 FFP. This am on CXR showed R hemothorax and CT placed with 250 cc out. Remains on Epi 2, dopamine 3 mcg, and milrinone 0.1. Mild temp 99.7. One dose of 20 mg IV lasix this am. On nitric oxide  CVP 18 Co-ox 57% PA 29/17 (22)  LVAD INTERROGATION:  HeartMate II LVAD:  Flow --- liters/min, speed 9000, power 3.4, PI 2.5.    Objective:    Vital Signs:   Temp:  [96.8 F (36 C)-100.2 F (37.9 C)] 100.2 F (37.9 C) (01/20 0900) Pulse Rate:  [26-90] 26 (01/20 0900) Resp:  [6-24] 20 (01/20 0900) BP: (102)/(39) 102/39 mmHg (01/19 1947) SpO2:  [84 %-100 %] 90 % (01/20 0900) Arterial Line BP: (66-150)/(32-79) 74/71 mmHg (01/20 0900) FiO2 (%):  [50 %-100 %] 50 % (01/20 0842) Weight:  [174 lb 9.7 oz (79.2 kg)] 174 lb 9.7 oz (79.2 kg) (01/20 0500) Last BM Date: 09/17/13 Mean arterial Pressure 60-70s  Intake/Output:   Intake/Output Summary (Last 24 hours) at 09/19/13 0958 Last data filed at 09/19/13 0900  Gross per 24 hour  Intake 8871.96 ml  Output   4400 ml  Net 4471.96 ml     Physical Exam: General: Intubated and sedated. No resp difficulty, reposnisve HEENT: normal Neck: supple. JVP to jaw; RIJ swan . Carotids 2+ bilat; no bruits. No lymphadenopathy or thryomegaly appreciated. Cor: Mechanical heart sounds with LVAD hum present. Lungs: diminished  Abdomen: soft, nondistended. No hepatosplenomegaly.  No bruits or masses. No BS Driveline: C/D/I; securement device intact and driveline secure Extremities: no cyanosis, clubbing, rash, trace LE edema Neuro: alert & orientedx3, cranial nerves grossly intact. moves all 4 extremities w/o difficulty. Affect pleasant  Telemetry: SR 80s  Labs: Basic Metabolic Panel:  Recent Labs Lab 09/13/13 1349 09/14/13 0430  09/16/13 0542 09/17/13 0954 09/18/13 0326  09/18/13 1321 09/18/13 1518 09/18/13 1535 09/18/13 2117 09/18/13 2118 09/19/13 0350  NA 141 141  < > 138 136* 136*  < > 140 141 141 141  --  140  K 4.5 3.9  < > 3.7 3.3* 4.0  < > 3.2* 3.4* 3.2* 4.4  --  5.0  CL 104 104  < > 102 98 100  --   --  104  --  103  --  102  CO2 22 24  < > 26 24 23   --   --  25  --   --   --  22  GLUCOSE 101* 92  < > 128* 158* 116*  < > 166* 99 98 118*  --  112*  BUN 41* 40*  < > 23 21 19   --   --  18  --  22  --  25*  CREATININE 2.07* 2.02*  < > 1.41* 1.49* 1.40*  --   --  1.14  --  1.40* 1.27 1.47*  CALCIUM 8.9  8.2*  < > 8.1* 8.2* 8.4  --   --  7.7*  --   --   --  8.2*  MG 2.0 2.0  --   --   --   --   --   --  1.9  --   --  3.2* 2.7*  PHOS  --   --   --   --   --   --   --   --   --   --   --   --  5.1*  < > = values in this interval not displayed.  Liver Function Tests:  Recent Labs Lab 09/13/13 1349 09/19/13 0350  AST 116* 428*  ALT 109* 162*  ALKPHOS 162* 69  BILITOT 0.9 4.3*  PROT 6.9 5.7*  ALBUMIN 3.0* 3.1*   No results found for this basename: LIPASE, AMYLASE,  in the last 168 hours No results found for this basename: AMMONIA,  in the last 168 hours  CBC:  Recent Labs Lab 09/13/13 1349  09/17/13 0457 09/18/13 0326  09/18/13 1151  09/18/13 1500 09/18/13 1518 09/18/13 1535 09/18/13 2117 09/18/13 2118 09/19/13 0350  WBC 6.8  < > 11.4* 9.7  --   --   --   --  12.3*  --   --  13.3* 13.8*  NEUTROABS 5.3  --   --   --   --   --   --   --   --   --   --   --  12.8*  HGB 9.0*  < > 9.2* 9.8*  < > 7.7*  < >  --  9.0* 10.5* 6.5* 5.0*  8.1*  HCT 29.0*  < > 29.3* 30.6*  < > 23.4*  < >  --  27.2* 31.0* 19.0* 15.4* 24.1*  MCV 82.4  < > 80.3 81.0  --   --   --   --  81.2  --   --  81.9 82.0  PLT 264  < > 254 213  --  96*  --  126* 127*  --   --  176 158  < > = values in this interval not displayed.  INR:  Recent Labs Lab 09/13/13 1349 09/18/13 1500 09/19/13 0350  INR 1.29 1.47 1.45     Imaging: Dg Chest Port 1 View  09/19/2013   CLINICAL DATA:  New chest tube insertion.  EXAM: PORTABLE CHEST - 1 VIEW  COMPARISON:  Chest radiograph September 19, 2013 at 3:59 a.m.  FINDINGS: Interval placement of right chest tube with side port projecting within the chest wall. Near-complete interval evacuation of the right pleural effusion. Left apical chest tube remains in place, no pneumothorax.  The cardiac silhouette remains moderately enlarged, status post median sternotomy for apparent coronary artery bypass grafting. Dual lead left cardiac pacemaker in situ. Right internal jugular central venous catheter with to lines, 1 of which is the swans Ganz catheter with distal tip projecting in main pulmonary artery, an aortic balloon pump marker projects at inferior aspect of aortic knob. Endotracheal tube tip projects 3.3 cm above the carina. Nasogastric tube in place, side port not visualized but past the gastroesophageal junction. Apparent mediastinal drain in place.  Diffuse interstitial prominence with decreased bilateral alveolar airspace opacities. Multiple EKG lines overlie the patient and may obscure subtle underlying pathology. Soft tissue planes and included osseous structures are nonsuspicious.  IMPRESSION: New right chest tube with side port projecting within the chest wall with near complete  evacuation of right pleural effusion. No apparent change remaining life support line. No pneumothorax.  Stable cardiomegaly, with similar interstitial prominence, and decreased superimposed alveolar airspace opacities.   Electronically Signed   By:  Elon Alas   On: 09/19/2013 06:06   Dg Chest Port 1 View  09/19/2013   CLINICAL DATA:  Follow-up status post nasal-tracheal suction.  EXAM: PORTABLE CHEST - 1 VIEW  COMPARISON:  Chest radiograph performed 09/18/2013  FINDINGS: The patient's endotracheal tube is seen ending 3-4 cm above the carina. A right IJ Swan-Ganz catheter is seen ending at the main pulmonary outflow tract. A second right IJ line is noted ending about the mid SVC. An enteric tube is seen similar diaphragm. A mediastinal drain and left apical chest tube are also seen. An intra-aortic balloon pump tip is noted just below the aortic knob. A ventricular assist device is partially imaged. A pacemaker/AICD is noted overlying the left chest wall, with leads ending overlying the right atrium and right ventricle.  There is an increasing moderate right-sided pleural effusion, layering posteriorly along the right lung. Underlying airspace opacification is again seen; left-sided airspace opacity appears mildly worsened. This may reflect pulmonary edema or possibly infection. No definite pneumothorax is identified.  The cardiomediastinal silhouette is mildly enlarged. The patient is status post median sternotomy. No acute osseous abnormalities are seen.  IMPRESSION: Increasing moderate right-sided pleural effusion. Left-sided airspace opacification appears mildly worsened, while right-sided airspace opacity is difficult to fully characterize due to the effusion. This may reflect worsening pulmonary edema or possibly infection. Underlying mild cardiomegaly noted. Lines and tubes as described above.   Electronically Signed   By: Garald Balding M.D.   On: 09/19/2013 04:22   Dg Chest Port 1 View  09/18/2013   CLINICAL DATA:  Postop bleeding.  EXAM: PORTABLE CHEST - 1 VIEW  COMPARISON:  DG CHEST 1V PORT dated 09/18/2013; DG CHEST 1V PORT dated 09/14/2013  FINDINGS: 2214 hr. Endotracheal tube terminates 5.1 cm above chronic. Pacer/AICD device. Right IJ  Swan-Ganz catheter with tip at pulmonary outflow tract. Intra-aortic balloon pump which is unchanged in position. Ventricular assist device which is also not significantly changed.  Bilateral chest tubes. Nasogastric tube which likely extends beyond the inferior aspect of the film.  Cardiomegaly accentuated by AP portable technique. Apparent high mediastinal soft tissue fullness could be due to AP portable technique. No pneumothorax. Suspect developing loculated pleural fluid. Moderate and progressive interstitial and alveolar edema, greater on the right.  IMPRESSION: Overall worsened aeration, with progressive congestive heart failure.  Suspect developing loculated right-sided pleural fluid superiorly and laterally.  Stable support apparatus, without pneumothorax.  Superior mediastinal soft tissue fullness which could be due to AP portable technique or postoperative edema. Recommend attention on follow-up.   Electronically Signed   By: Abigail Miyamoto M.D.   On: 09/18/2013 23:01   Dg Chest Port 1 View  09/18/2013   CLINICAL DATA:  Postop from placement of a ventricular assist device.  EXAM: PORTABLE CHEST - 1 VIEW  COMPARISON:  09/17/2013  FINDINGS: Cardiac surgery and placement of a left ventricular assist device has been performed since the prior exam. The cardiac silhouette is mildly enlarged, but stable. There is no mediastinal widening. Hazy airspace opacity projects throughout the perihilar regions consistent with mild edema.  An endotracheal tube has its tip projecting 4.2 cm above the carina. A right internal jugular Swan-Ganz catheter has its tip in the main pulmonary artery. A second right internal jugular central venous  line has its tip in the mid superior vena cava. There is an intra aortic balloon pump with its tip just below the aortic knob. A left chest tube has its tip at the left apex. Another tube extends over the left hemidiaphragm with another adjacent to the medial right hemidiaphragm. A  nasogastric tube passes below the diaphragm into the stomach.  No gross pneumothorax is noted on the supine study.  IMPRESSION: Status post cardiac surgery and left ventricular assist device placement.  Mild central pulmonary edema is noted.  Support apparatus is well positioned as detailed.   Electronically Signed   By: Lajean Manes M.D.   On: 09/18/2013 16:41     Medications:     Scheduled Medications: . acetaminophen  1,000 mg Oral Q6H   Or  . acetaminophen (TYLENOL) oral liquid 160 mg/5 mL  1,000 mg Per Tube Q6H  . aspirin EC  325 mg Oral Daily   Or  . aspirin  324 mg Per Tube Daily   Or  . aspirin  300 mg Rectal Daily  . bisacodyl  10 mg Oral Daily   Or  . bisacodyl  10 mg Rectal Daily  . budesonide-formoterol  2 puff Inhalation BID  . chlorhexidine  15 mL Mouth/Throat BID  . docusate sodium  200 mg Oral Daily  . ezetimibe  10 mg Oral Daily  . insulin regular  0-10 Units Intravenous TID WC  . levalbuterol  1.25 mg Nebulization Q6H  . levothyroxine  150 mcg Oral Custom   And  . levothyroxine  75 mcg Oral Custom  . metoCLOPramide (REGLAN) injection  10 mg Intravenous Q6H  . pantoprazole (PROTONIX) IV  40 mg Intravenous Q24H  . piperacillin-tazobactam (ZOSYN)  IV  3.375 g Intravenous Q8H  . sodium chloride  3 mL Intravenous Q12H  . vancomycin (VANCOCIN) 750 mg IVPB  750 mg Intravenous Q12H    Infusions: . sodium chloride 20 mL/hr at 09/19/13 0400  . sodium chloride 10 mL/hr at 09/18/13 2000  . sodium chloride    . dexmedetomidine 0.7 mcg/kg/hr (09/19/13 0900)  . DOPamine 3 mcg/kg/min (09/19/13 0900)  . EPINEPHrine infusion 3 mcg/min (09/19/13 0900)  . insulin (NOVOLIN-R) infusion 1.7 Units/hr (09/18/13 1914)  . lactated ringers 20 mL/hr at 09/19/13 0809  . milrinone 0.1 mcg/kg/min (09/19/13 0900)  . norepinephrine (LEVOPHED) Adult infusion Stopped (09/19/13 0300)    PRN Medications: sodium chloride, albumin human, midazolam, morphine injection, ondansetron (ZOFRAN)  IV, oxyCODONE, potassium chloride, sodium chloride, zolpidem   Assessment:   1. 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 Acute on chronic renal failure, stage III  4. Acute blood loss anemia, expected - total 3 PRBCs, 1 FFP   Plan/Discussion:    Overall tenuous but stabilizing. VAD with several low flow alarms likely related to bleeding. Now receiving 3rd PRBC this am, chest tube drainage appears to be slowing down. Will continue to follow CBC closely. IABP removed at bedside this am. Remains on epi, milrinone and dopamine. Will try to wean NO this am slowly and extubate this afternoon per Dr. Prescott Gum. VAD speed turned down to 8800. May need to go to [place].   Volume overload, but is third spacing and PI low. Will need to be careful with diuresis. Can give PRN lasix to try to keep CVP < 15. Remain on abxs, all cultures from previous days negative and sputum cx from overnight pending.   No coumadin  tonight given ongoing bleeding. Continue SCDs.  I reviewed the LVAD parameters from today, and compared the results to the patient's prior recorded data.  No programming changes were made.  The LVAD is functioning within specified parameters.  The patient performs LVAD self-test daily.  LVAD interrogation was negative for any significant power changes, alarms or PI events/speed drops.  LVAD equipment check completed and is in good working order.  Back-up equipment present.   LVAD education done on emergency procedures and precautions and reviewed exit site care.  Length of Stay: 6  Genevive Bi 09/19/2013, 9:58 AM  VAD Team Pager [phone] (7am - 7am)

## 2013-09-19 NOTE — Progress Notes (Signed)
Spoke with Dr. Prescott Gum in reference to chest xray results.  Received order for NTS suctioning to verify that pt right lung no plugged off and to collect sputum culture.  Per RT, since pt vented NTS would require cuff to be deflated and run a risk of tearing cuff while attempting to pass suction catheter.  RT indicated that deep tracheal suctioning would provide the same results.  Deep tracheal suctioning performed by RT.  No sputum produced.  Will continue to monitor pt.

## 2013-09-19 NOTE — Progress Notes (Signed)
CSW met with wife at bedside to offer support. Patient sedated and on vent in ICU. Wife reports she was disappointed that she missed patient opening his eyes earlier and stated she will wait at bedside to see if he opens his again. Wife appears to be coping well and has good family and church support. CSW offered support and will continue to follow throughout recovery. Raquel Sarna, [place]

## 2013-09-19 NOTE — Progress Notes (Signed)
HeartMate 2 Rounding Note  Subjective:   Johnny Oneill is a 73 year old patient with ischemic cardiomyopathy with advanced heart failure on the transplant waiting list at [facility] and supported with home milrinone since April 2014. On this admission he was admitted with acute on chronic heart failure was found to be in cardiogenic shock which responded only to increased dose of milrinone and placement of intra-aortic balloon pump. The patient was discussed at the [facility] conference and felt to be an appropriate candidate for LVAD implantation as a bridge to transplantation. Prior to surgery the details of LVAD implantation were thoroughly discussed with patient and his wife including the expected postop recovery in the long-term postoperative care and followup.  Postop day 1 the LVAD implantation Intraoperative and postoperative course complicated by friable LV myocardium at the apical cannulation site resulting in bleeding from suture tears in the myocardium repaired by multiple extra pledgeted sutures placed before separating from cardio coronary bypass.  Patient's postoperative coagulopathy responded to blood product replacement, 1 dose of DDAVP, and he required placement of a right chest tube on the night of surgery for a 300 cc bloody right pleural effusion. He received 2 units of packed cells overnight  Patient currently hemodynamically stable on low-dose inotropes the remains intubated. Nitric oxide will be slowly weaned today. His chest x-ray appears wet and his FiO2 is in the process of being weaned. Chest tube drainage is now minimal.  LVAD INTERROGATION:  HeartMate II LVAD:  Flow 3.3 liters/min, speed 8800 RPM power 4.4 PI 3.4  .   Objective:    Vital Signs:   Temp:  [96.8 F (36 C)-100.8 F (38.2 C)] 100.4 F (38 C) (01/20 1500) Pulse Rate:  [26-94] 94 (01/20 1500) Resp:  [6-24] 18 (01/20 1500) BP: (102)/(39) 102/39 mmHg (01/19 1947) SpO2:  [84 %-100 %] 93 % (01/20  1500) Arterial Line BP: (48-150)/(32-79) 71/65 mmHg (01/20 1500) FiO2 (%):  [50 %-100 %] 50 % (01/20 1222) Weight:  [174 lb 9.7 oz (79.2 kg)] 174 lb 9.7 oz (79.2 kg) (01/20 0500) Last BM Date: 09/17/13 Mean arterial Pressure 60-65 mmHg  Intake/Output:   Intake/Output Summary (Last 24 hours) at 09/19/13 1543 Last data filed at 09/19/13 1500  Gross per 24 hour  Intake 5355.73 ml  Output   3930 ml  Net 1425.73 ml     Physical Exam: General:  Intubated and sedated HEENT: normal, minimal edema Neck: supple. JVP .  no bruits. No lymphadenopathy or thryomegaly appreciated. Cor: Mechanical heart sounds with LVAD hum present. Lungs: Scattered rhonchi, chest tubes with minimal sanguinous drainage Abdomen: soft, nontender, nondistended. No hepatosplenomegaly. No bruits or masses. Good bowel diminished Driveline: C/D/I; securement device intact and driveline incorporated Extremities: no cyanosis, clubbing, rash, edema Neuro: Nontender question, moves extremities  Telemetry: Atrially paced 88 per minute  Labs: Basic Metabolic Panel:  Recent Labs Lab 09/13/13 1349 09/14/13 0430  09/16/13 0542 09/17/13 0954 09/18/13 0326  09/18/13 1321 09/18/13 1518 09/18/13 1535 09/18/13 2117 09/18/13 2118 09/19/13 0350  NA 141 141  < > 138 136* 136*  < > 140 141 141 141  --  140  K 4.5 3.9  < > 3.7 3.3* 4.0  < > 3.2* 3.4* 3.2* 4.4  --  5.0  CL 104 104  < > 102 98 100  --   --  104  --  103  --  102  CO2 22 24  < > 26 24 23   --   --  25  --   --   --  22  GLUCOSE 101* 92  < > 128* 158* 116*  < > 166* 99 98 118*  --  112*  BUN 41* 40*  < > 23 21 19   --   --  18  --  22  --  25*  CREATININE 2.07* 2.02*  < > 1.41* 1.49* 1.40*  --   --  1.14  --  1.40* 1.27 1.47*  CALCIUM 8.9 8.2*  < > 8.1* 8.2* 8.4  --   --  7.7*  --   --   --  8.2*  MG 2.0 2.0  --   --   --   --   --   --  1.9  --   --  3.2* 2.7*  PHOS  --   --   --   --   --   --   --   --   --   --   --   --  5.1*  < > = values in this  interval not displayed.  Liver Function Tests:  Recent Labs Lab 09/13/13 1349 09/19/13 0350  AST 116* 428*  ALT 109* 162*  ALKPHOS 162* 69  BILITOT 0.9 4.3*  PROT 6.9 5.7*  ALBUMIN 3.0* 3.1*   No results found for this basename: LIPASE, AMYLASE,  in the last 168 hours No results found for this basename: AMMONIA,  in the last 168 hours  CBC:  Recent Labs Lab 09/13/13 1349  09/17/13 0457 09/18/13 0326  09/18/13 1151  09/18/13 1500 09/18/13 1518 09/18/13 1535 09/18/13 2117 09/18/13 2118 09/19/13 0350  WBC 6.8  < > 11.4* 9.7  --   --   --   --  12.3*  --   --  13.3* 13.8*  NEUTROABS 5.3  --   --   --   --   --   --   --   --   --   --   --  12.8*  HGB 9.0*  < > 9.2* 9.8*  < > 7.7*  < >  --  9.0* 10.5* 6.5* 5.0* 8.1*  HCT 29.0*  < > 29.3* 30.6*  < > 23.4*  < >  --  27.2* 31.0* 19.0* 15.4* 24.1*  MCV 82.4  < > 80.3 81.0  --   --   --   --  81.2  --   --  81.9 82.0  PLT 264  < > 254 213  --  96*  --  126* 127*  --   --  176 158  < > = values in this interval not displayed.  INR:  Recent Labs Lab 09/13/13 1349 09/18/13 1500 09/19/13 0350  INR 1.29 1.47 1.45    Other results:  EKG:   Imaging: Dg Chest Port 1 View  09/19/2013   CLINICAL DATA:  New chest tube insertion.  EXAM: PORTABLE CHEST - 1 VIEW  COMPARISON:  Chest radiograph September 19, 2013 at 3:59 a.m.  FINDINGS: Interval placement of right chest tube with side port projecting within the chest wall. Near-complete interval evacuation of the right pleural effusion. Left apical chest tube remains in place, no pneumothorax.  The cardiac silhouette remains moderately enlarged, status post median sternotomy for apparent coronary artery bypass grafting. Dual lead left cardiac pacemaker in situ. Right internal jugular central venous catheter with to lines, 1 of which is the swans Ganz catheter with distal tip projecting in main pulmonary artery, an aortic balloon pump marker projects at inferior aspect  of aortic knob.  Endotracheal tube tip projects 3.3 cm above the carina. Nasogastric tube in place, side port not visualized but past the gastroesophageal junction. Apparent mediastinal drain in place.  Diffuse interstitial prominence with decreased bilateral alveolar airspace opacities. Multiple EKG lines overlie the patient and may obscure subtle underlying pathology. Soft tissue planes and included osseous structures are nonsuspicious.  IMPRESSION: New right chest tube with side port projecting within the chest wall with near complete evacuation of right pleural effusion. No apparent change remaining life support line. No pneumothorax.  Stable cardiomegaly, with similar interstitial prominence, and decreased superimposed alveolar airspace opacities.   Electronically Signed   By: Elon Alas   On: 09/19/2013 06:06   Dg Chest Port 1 View  09/19/2013   CLINICAL DATA:  Follow-up status post nasal-tracheal suction.  EXAM: PORTABLE CHEST - 1 VIEW  COMPARISON:  Chest radiograph performed 09/18/2013  FINDINGS: The patient's endotracheal tube is seen ending 3-4 cm above the carina. A right IJ Swan-Ganz catheter is seen ending at the main pulmonary outflow tract. A second right IJ line is noted ending about the mid SVC. An enteric tube is seen similar diaphragm. A mediastinal drain and left apical chest tube are also seen. An intra-aortic balloon pump tip is noted just below the aortic knob. A ventricular assist device is partially imaged. A pacemaker/AICD is noted overlying the left chest wall, with leads ending overlying the right atrium and right ventricle.  There is an increasing moderate right-sided pleural effusion, layering posteriorly along the right lung. Underlying airspace opacification is again seen; left-sided airspace opacity appears mildly worsened. This may reflect pulmonary edema or possibly infection. No definite pneumothorax is identified.  The cardiomediastinal silhouette is mildly enlarged. The patient is  status post median sternotomy. No acute osseous abnormalities are seen.  IMPRESSION: Increasing moderate right-sided pleural effusion. Left-sided airspace opacification appears mildly worsened, while right-sided airspace opacity is difficult to fully characterize due to the effusion. This may reflect worsening pulmonary edema or possibly infection. Underlying mild cardiomegaly noted. Lines and tubes as described above.   Electronically Signed   By: Garald Balding M.D.   On: 09/19/2013 04:22   Dg Chest Port 1 View  09/18/2013   CLINICAL DATA:  Postop bleeding.  EXAM: PORTABLE CHEST - 1 VIEW  COMPARISON:  DG CHEST 1V PORT dated 09/18/2013; DG CHEST 1V PORT dated 09/14/2013  FINDINGS: 2214 hr. Endotracheal tube terminates 5.1 cm above chronic. Pacer/AICD device. Right IJ Swan-Ganz catheter with tip at pulmonary outflow tract. Intra-aortic balloon pump which is unchanged in position. Ventricular assist device which is also not significantly changed.  Bilateral chest tubes. Nasogastric tube which likely extends beyond the inferior aspect of the film.  Cardiomegaly accentuated by AP portable technique. Apparent high mediastinal soft tissue fullness could be due to AP portable technique. No pneumothorax. Suspect developing loculated pleural fluid. Moderate and progressive interstitial and alveolar edema, greater on the right.  IMPRESSION: Overall worsened aeration, with progressive congestive heart failure.  Suspect developing loculated right-sided pleural fluid superiorly and laterally.  Stable support apparatus, without pneumothorax.  Superior mediastinal soft tissue fullness which could be due to AP portable technique or postoperative edema. Recommend attention on follow-up.   Electronically Signed   By: Abigail Miyamoto M.D.   On: 09/18/2013 23:01   Dg Chest Port 1 View  09/18/2013   CLINICAL DATA:  Postop from placement of a ventricular assist device.  EXAM: PORTABLE CHEST - 1 VIEW  COMPARISON:  09/17/2013  FINDINGS:  Cardiac surgery and placement of a left ventricular assist device has been performed since the prior exam. The cardiac silhouette is mildly enlarged, but stable. There is no mediastinal widening. Hazy airspace opacity projects throughout the perihilar regions consistent with mild edema.  An endotracheal tube has its tip projecting 4.2 cm above the carina. A right internal jugular Swan-Ganz catheter has its tip in the main pulmonary artery. A second right internal jugular central venous line has its tip in the mid superior vena cava. There is an intra aortic balloon pump with its tip just below the aortic knob. A left chest tube has its tip at the left apex. Another tube extends over the left hemidiaphragm with another adjacent to the medial right hemidiaphragm. A nasogastric tube passes below the diaphragm into the stomach.  No gross pneumothorax is noted on the supine study.  IMPRESSION: Status post cardiac surgery and left ventricular assist device placement.  Mild central pulmonary edema is noted.  Support apparatus is well positioned as detailed.   Electronically Signed   By: Lajean Manes M.D.   On: 09/18/2013 16:41      Medications:     Scheduled Medications: . acetaminophen  1,000 mg Oral Q6H   Or  . acetaminophen (TYLENOL) oral liquid 160 mg/5 mL  1,000 mg Per Tube Q6H  . antiseptic oral rinse  15 mL Mouth Rinse QID  . aspirin EC  325 mg Oral Daily   Or  . aspirin  324 mg Per Tube Daily   Or  . aspirin  300 mg Rectal Daily  . bisacodyl  10 mg Oral Daily   Or  . bisacodyl  10 mg Rectal Daily  . budesonide-formoterol  2 puff Inhalation BID  . chlorhexidine  15 mL Mouth/Throat BID  . docusate sodium  200 mg Oral Daily  . ezetimibe  10 mg Oral Daily  . insulin regular  0-10 Units Intravenous TID WC  . levalbuterol  1.25 mg Nebulization Q6H  . levothyroxine  150 mcg Oral Custom   And  . levothyroxine  75 mcg Oral Custom  . metoCLOPramide (REGLAN) injection  10 mg Intravenous Q6H  .  pantoprazole (PROTONIX) IV  40 mg Intravenous Q24H  . piperacillin-tazobactam (ZOSYN)  IV  3.375 g Intravenous Q8H  . sodium chloride  3 mL Intravenous Q12H  . vancomycin (VANCOCIN) 750 mg IVPB  750 mg Intravenous Q12H     Infusions: . sodium chloride 20 mL/hr at 09/19/13 0400  . sodium chloride 10 mL/hr at 09/18/13 2000  . sodium chloride    . dexmedetomidine 0.7 mcg/kg/hr (09/19/13 1500)  . DOPamine 3 mcg/kg/min (09/19/13 1500)  . EPINEPHrine infusion 3 mcg/min (09/19/13 1500)  . insulin (NOVOLIN-R) infusion 0.4 Units/hr (09/19/13 1500)  . lactated ringers 20 mL/hr at 09/19/13 0809  . milrinone 0.1 mcg/kg/min (09/19/13 1400)  . norepinephrine (LEVOPHED) Adult infusion Stopped (09/19/13 0300)     PRN Medications:  sodium chloride, midazolam, morphine injection, ondansetron (ZOFRAN) IV, oxyCODONE, potassium chloride, sodium chloride, zolpidem   Assessment:  1 acute on chronic systolic heart failure EF 20% requiring implantable LVAD January 19,015 2 ischemic cardiomyopathy 3 perioperative coagulopathy now improved 4 ventilator dependent respiratory insufficiency due to pulmonary edema, possible ARDS from cardio pulmonary bypass   Plan/Discussion:   Patient needs day of stability-will not probably extubate but we'll try to gently diurese to improve pulmonary status for extubation within the next 48 hours and continue nebulizers and antibiotics.  I reviewed the LVAD  parameters from today, and compared the results to the patient's prior recorded data.  No programming changes were made.  The LVAD is functioning within specified parameters.  The patient performs LVAD self-test daily.  LVAD interrogation was negative for any significant power changes, alarms or PI events/speed drops.  LVAD equipment check completed and is in good working order.  Back-up equipment present.   LVAD education done on emergency procedures and precautions and reviewed exit site care.  Length of Stay:  6  Johnny Oneill,Johnny Oneill 09/19/2013, 3:43 PM  VAD Team Pager [phone] (7am - 7am)

## 2013-09-20 ENCOUNTER — Inpatient Hospital Stay (HOSPITAL_COMMUNITY): Payer: Medicare Other

## 2013-09-20 LAB — LACTATE DEHYDROGENASE: LDH: 768 U/L — ABNORMAL HIGH (ref 94–250)

## 2013-09-20 LAB — COMPREHENSIVE METABOLIC PANEL
ALT: 270 U/L — ABNORMAL HIGH (ref 0–53)
AST: 417 U/L — ABNORMAL HIGH (ref 0–37)
Albumin: 3 g/dL — ABNORMAL LOW (ref 3.5–5.2)
Alkaline Phosphatase: 82 U/L (ref 39–117)
BUN: 39 mg/dL — ABNORMAL HIGH (ref 6–23)
CO2: 20 mEq/L (ref 19–32)
Calcium: 8.7 mg/dL (ref 8.4–10.5)
Chloride: 103 mEq/L (ref 96–112)
Creatinine, Ser: 2.06 mg/dL — ABNORMAL HIGH (ref 0.50–1.35)
GFR calc Af Amer: 36 mL/min — ABNORMAL LOW (ref >90)
GFR calc non Af Amer: 31 mL/min — ABNORMAL LOW (ref >90)
Glucose, Bld: 174 mg/dL — ABNORMAL HIGH (ref 70–99)
Potassium: 5 mEq/L (ref 3.7–5.3)
Sodium: 141 mEq/L (ref 137–147)
Total Bilirubin: 5.7 mg/dL — ABNORMAL HIGH (ref 0.3–1.2)
Total Protein: 6 g/dL (ref 6.0–8.3)

## 2013-09-20 LAB — CARBOXYHEMOGLOBIN
Carboxyhemoglobin: 1.7 % — ABNORMAL HIGH (ref 0.5–1.5)
Carboxyhemoglobin: 1.6 % — ABNORMAL HIGH (ref 0.5–1.5)
Methemoglobin: 1.9 % — ABNORMAL HIGH (ref 0.0–1.5)
Methemoglobin: 1.6 % — ABNORMAL HIGH (ref 0.0–1.5)
O2 Saturation: 62.7 %
O2 Saturation: 52.3 %
Total hemoglobin: 8.9 g/dL — ABNORMAL LOW (ref 13.5–18.0)
Total hemoglobin: 9.1 g/dL — ABNORMAL LOW (ref 13.5–18.0)

## 2013-09-20 LAB — CBC
HCT: 26 % — ABNORMAL LOW (ref 39.0–52.0)
Hemoglobin: 8.9 g/dL — ABNORMAL LOW (ref 13.0–17.0)
MCH: 28.4 pg (ref 26.0–34.0)
MCHC: 34.2 g/dL (ref 30.0–36.0)
MCV: 83.1 fL (ref 78.0–100.0)
Platelets: 121 10*3/uL — ABNORMAL LOW (ref 150–400)
RBC: 3.13 MIL/uL — ABNORMAL LOW (ref 4.22–5.81)
RDW: 17 % — ABNORMAL HIGH (ref 11.5–15.5)
WBC: 11.5 10*3/uL — ABNORMAL HIGH (ref 4.0–10.5)

## 2013-09-20 LAB — POCT I-STAT 3, ART BLOOD GAS (G3+)
Acid-base deficit: 3 mmol/L — ABNORMAL HIGH (ref 0.0–2.0)
Acid-base deficit: 2 mmol/L (ref 0.0–2.0)
Acid-base deficit: 3 mmol/L — ABNORMAL HIGH (ref 0.0–2.0)
Acid-base deficit: 3 mmol/L — ABNORMAL HIGH (ref 0.0–2.0)
Bicarbonate: 22 mEq/L (ref 20.0–24.0)
Bicarbonate: 22.8 mEq/L (ref 20.0–24.0)
Bicarbonate: 22 mEq/L (ref 20.0–24.0)
Bicarbonate: 22.6 mEq/L (ref 20.0–24.0)
O2 Saturation: 98 %
O2 Saturation: 97 %
O2 Saturation: 94 %
O2 Saturation: 98 %
Patient temperature: 38
Patient temperature: 37.8
Patient temperature: 37.5
Patient temperature: 36.7
TCO2: 23 mmol/L (ref 0–100)
TCO2: 24 mmol/L (ref 0–100)
TCO2: 23 mmol/L (ref 0–100)
TCO2: 24 mmol/L (ref 0–100)
pCO2 arterial: 37.6 mmHg (ref 35.0–45.0)
pCO2 arterial: 37.2 mmHg (ref 35.0–45.0)
pCO2 arterial: 37.1 mmHg (ref 35.0–45.0)
pCO2 arterial: 40.6 mmHg (ref 35.0–45.0)
pH, Arterial: 7.38 (ref 7.350–7.450)
pH, Arterial: 7.398 (ref 7.350–7.450)
pH, Arterial: 7.384 (ref 7.350–7.450)
pH, Arterial: 7.352 (ref 7.350–7.450)
pO2, Arterial: 104 mmHg — ABNORMAL HIGH (ref 80.0–100.0)
pO2, Arterial: 95 mmHg (ref 80.0–100.0)
pO2, Arterial: 72 mmHg — ABNORMAL LOW (ref 80.0–100.0)
pO2, Arterial: 112 mmHg — ABNORMAL HIGH (ref 80.0–100.0)

## 2013-09-20 LAB — MAGNESIUM: Magnesium: 2.7 mg/dL — ABNORMAL HIGH (ref 1.5–2.5)

## 2013-09-20 LAB — CBC WITH DIFFERENTIAL/PLATELET
Basophils Absolute: 0 10*3/uL (ref 0.0–0.1)
Basophils Relative: 0 % (ref 0–1)
Eosinophils Absolute: 0 10*3/uL (ref 0.0–0.7)
Eosinophils Relative: 0 % (ref 0–5)
HCT: 27 % — ABNORMAL LOW (ref 39.0–52.0)
Hemoglobin: 9.1 g/dL — ABNORMAL LOW (ref 13.0–17.0)
Lymphocytes Relative: 2 % — ABNORMAL LOW (ref 12–46)
Lymphs Abs: 0.2 10*3/uL — ABNORMAL LOW (ref 0.7–4.0)
MCH: 27.8 pg (ref 26.0–34.0)
MCHC: 33.7 g/dL (ref 30.0–36.0)
MCV: 82.6 fL (ref 78.0–100.0)
Monocytes Absolute: 0.5 10*3/uL (ref 0.1–1.0)
Monocytes Relative: 4 % (ref 3–12)
Neutro Abs: 11.5 10*3/uL — ABNORMAL HIGH (ref 1.7–7.7)
Neutrophils Relative %: 94 % — ABNORMAL HIGH (ref 43–77)
Platelets: 140 10*3/uL — ABNORMAL LOW (ref 150–400)
RBC: 3.27 MIL/uL — ABNORMAL LOW (ref 4.22–5.81)
RDW: 16.6 % — ABNORMAL HIGH (ref 11.5–15.5)
WBC: 12.2 10*3/uL — ABNORMAL HIGH (ref 4.0–10.5)

## 2013-09-20 LAB — INFLUENZA PANEL BY PCR (TYPE A & B)
H1N1 flu by pcr: NOT DETECTED
Influenza A By PCR: NEGATIVE
Influenza B By PCR: NEGATIVE

## 2013-09-20 LAB — BASIC METABOLIC PANEL
BUN: 47 mg/dL — ABNORMAL HIGH (ref 6–23)
CO2: 21 mEq/L (ref 19–32)
Calcium: 8.6 mg/dL (ref 8.4–10.5)
Chloride: 103 mEq/L (ref 96–112)
Creatinine, Ser: 2.03 mg/dL — ABNORMAL HIGH (ref 0.50–1.35)
GFR calc Af Amer: 37 mL/min — ABNORMAL LOW (ref >90)
GFR calc non Af Amer: 32 mL/min — ABNORMAL LOW (ref >90)
Glucose, Bld: 126 mg/dL — ABNORMAL HIGH (ref 70–99)
Potassium: 4.7 mEq/L (ref 3.7–5.3)
Sodium: 140 mEq/L (ref 137–147)

## 2013-09-20 LAB — PROTIME-INR
INR: 1.42 (ref 0.00–1.49)
Prothrombin Time: 17 seconds — ABNORMAL HIGH (ref 11.6–15.2)

## 2013-09-20 LAB — GLUCOSE, CAPILLARY
Glucose-Capillary: 174 mg/dL — ABNORMAL HIGH (ref 70–99)
Glucose-Capillary: 176 mg/dL — ABNORMAL HIGH (ref 70–99)
Glucose-Capillary: 162 mg/dL — ABNORMAL HIGH (ref 70–99)
Glucose-Capillary: 121 mg/dL — ABNORMAL HIGH (ref 70–99)
Glucose-Capillary: 130 mg/dL — ABNORMAL HIGH (ref 70–99)
Glucose-Capillary: 163 mg/dL — ABNORMAL HIGH (ref 70–99)

## 2013-09-20 LAB — PHOSPHORUS: Phosphorus: 5.6 mg/dL — ABNORMAL HIGH (ref 2.3–4.6)

## 2013-09-20 LAB — VANCOMYCIN, TROUGH: Vancomycin Tr: 24.1 ug/mL — ABNORMAL HIGH (ref 10.0–20.0)

## 2013-09-20 MED ORDER — AMIODARONE HCL IN DEXTROSE 360-4.14 MG/200ML-% IV SOLN
INTRAVENOUS | Status: AC
  Administered 2013-09-20: 200 mL
  Filled 2013-09-20: qty 200

## 2013-09-20 MED ORDER — MENTHOL 3 MG MT LOZG
1.0000 | LOZENGE | OROMUCOSAL | Status: DC | PRN
  Filled 2013-09-20: qty 9

## 2013-09-20 MED ORDER — FUROSEMIDE 10 MG/ML IJ SOLN
40.0000 mg | Freq: Once | INTRAMUSCULAR | Status: AC
  Administered 2013-09-20: 40 mg via INTRAVENOUS
  Filled 2013-09-20: qty 4

## 2013-09-20 MED ORDER — MILRINONE IN DEXTROSE 20 MG/100ML IV SOLN
0.1000 ug/kg/min | INTRAVENOUS | Status: DC
Start: 2013-09-20 — End: 2013-09-22
  Administered 2013-09-21 (×2): 0.3 ug/kg/min via INTRAVENOUS
  Filled 2013-09-20 (×2): qty 100

## 2013-09-20 MED ORDER — AMIODARONE HCL IN DEXTROSE 360-4.14 MG/200ML-% IV SOLN
30.0000 mg/h | INTRAVENOUS | Status: AC
  Administered 2013-09-21 – 2013-09-22 (×3): 30 mg/h via INTRAVENOUS
  Filled 2013-09-20 (×5): qty 200

## 2013-09-20 MED ORDER — AMIODARONE HCL IN DEXTROSE 360-4.14 MG/200ML-% IV SOLN
60.0000 mg/h | INTRAVENOUS | Status: AC
  Administered 2013-09-20: 60 mg/h via INTRAVENOUS
  Filled 2013-09-20: qty 200

## 2013-09-20 MED ORDER — WARFARIN SODIUM 2.5 MG PO TABS
2.5000 mg | ORAL_TABLET | Freq: Once | ORAL | Status: AC
  Administered 2013-09-20: 2.5 mg via ORAL
  Filled 2013-09-20: qty 1

## 2013-09-20 MED ORDER — VANCOMYCIN HCL 10 G IV SOLR
1250.0000 mg | INTRAVENOUS | Status: DC
  Administered 2013-09-20 – 2013-09-22 (×3): 1250 mg via INTRAVENOUS
  Filled 2013-09-20 (×4): qty 1250

## 2013-09-20 MED ORDER — AMIODARONE LOAD VIA INFUSION
150.0000 mg | Freq: Once | INTRAVENOUS | Status: AC
  Administered 2013-09-20: 150 mg via INTRAVENOUS
  Filled 2013-09-20: qty 83.34

## 2013-09-20 MED ORDER — WARFARIN - PHYSICIAN DOSING INPATIENT
Freq: Every day | Status: DC
  Administered 2013-09-21 – 2013-09-25 (×3)

## 2013-09-20 NOTE — Progress Notes (Signed)
Called by Ander Purpura, nurse taking care of patient. He got up to bed to dangle and PI dropped to 2.6, however quickly returned to 5's once laying back down. MAPs 90s on Epi and Levophed so they were weaned down and CI dropped to 1.7. Currently on Epi 1.5 and dopamine 3 mcg. Levophed is off with high MAP. All LVAD parameters stable. Not much CT output with dangle.   PA pressures: 38/22 (27) CVP 17  Discussed with Dr. Darcey [name] and he would like to restart milrinone 0.1, continue dopamine renal, and use levophed for MAP < 75.  Junie Bame B NP-C 12:06 PM

## 2013-09-20 NOTE — Procedures (Signed)
Extubation Procedure Note  Patient Details:   Name: Johnny Oneill DOB: [birth year] MRN: [id number]   Pt extubated to 4L [name] with nitric @ 3ppm. Pt tolerating well at this time time, PAP 29/15. Pt able to vocalize, no stridor noted, VS WNL, pt tolerating well at this time, RT to monitor.    Evaluation  O2 sats: stable throughout Complications: No apparent complications Patient did tolerate procedure well. Bilateral Breath Sounds: Clear Suctioning: Airway Yes  Jetty Peeks 09/20/2013, 9:01 AM

## 2013-09-20 NOTE — Progress Notes (Signed)
Nitric turned off at this time. Vitals remain stable

## 2013-09-20 NOTE — Progress Notes (Signed)
Anesthesiology Follow-up:  Awake and alert, neuro intact, extubated this AM at 09:00. Stable hemodynamically with good urine output.  VS: T- 37.7  MAP- 80 HR- 98 (SR) RR-21 O2 Sat 99% on 4L PA: 32/18 CVP: 17  CO/CI: 4.4/2.4  VAD Parameters: RPM: 8800 Pump Flow: 3.8 LPM Power: 4 watts PI: 5.1  Dopamine 3 mcg/kg/min Epinephrine: 2 mcg/min Norepi: 2 mcg/min Milrinone 0.1 mcg/kg/min  Na-141 K-5.0 BUN/Cr 39/2.06 H/H: 9.1/27 platelets: 140,000, glucose 174   POD# 2 doing well, stable post-extubation, plan to wean pressors as tolerated.  Roberts Gaudy, MD

## 2013-09-20 NOTE — Progress Notes (Signed)
HeartMate 2 Rounding Note  Subjective:    Mr Stgermain is a 73 year old male with history of CAD s/p stent to LCX 2004 with re-occlusion of LCX with lateral MI 2009 and repeat stenting, total occlusion of RCA, chronic systolic heart failure EF 20% on chronic Milrinone at 0.375 mcg, ICD 2009, PVCs, hypothyroidism, hyperlipidemia, and arthritis. S/P L TKR 2013.   Admitted for VAD work up. Swan placed. IABP placed due to low CO-OX and worsening renal function.   POD #2 LVAD implantation for BTT. LV was friable and soft causing lots of bleeding. Received DDAVP along with multiple blood products. R hemothorax and CT placed night of surgery.   Received 1 PRBC last night. Weight down 3 lbs, 24 hr I/O +635 cc. Levophed now off and remains on milrinone 0.1, epi 1.5 and dopamine 3. Temps all throughout the night with peak of 100.8. Nitric on 1ppm and following commands.  CVP 15 Co-ox 62% PA 27/16 (19) Co/CI 4.1/2.2  Cr 1.47>1.83>2.06 WBC 13.8>10.2>12.2 Hgb 8.1>7.9>9.1  LVAD INTERROGATION:  HeartMate II LVAD:  Flow 3.3 liters/min, speed 8800, power 4.2, PI 3.0 (no events overnight).    Objective:    Vital Signs:   Temp:  [99.7 F (37.6 C)-100.8 F (38.2 C)] 100.4 F (38 C) (01/21 0500) Pulse Rate:  [26-101] 97 (01/21 0500) Resp:  [12-24] 18 (01/21 0500) SpO2:  [84 %-100 %] 97 % (01/21 0500) Arterial Line BP: (48-122)/(44-85) 98/85 mmHg (01/21 0500) FiO2 (%):  [50 %] 50 % (01/21 0500) Weight:  [171 lb 8.3 oz (77.8 kg)] 171 lb 8.3 oz (77.8 kg) (01/21 0446) Last BM Date: 09/17/13 Mean arterial Pressure 60-70s  Intake/Output:   Intake/Output Summary (Last 24 hours) at 09/20/13 0656 Last data filed at 09/20/13 0530  Gross per 24 hour  Intake 3157.8 ml  Output   2465 ml  Net  692.8 ml     Physical Exam: General: Intubated and sedated. No resp difficulty, reposnsive and following commands HEENT: normal Neck: supple. JVP elevated; RIJ swan . Carotids 2+ bilat; no bruits. No  lymphadenopathy or thryomegaly appreciated. Cor: Mechanical heart sounds with LVAD hum present. Lungs: diminished  Abdomen: soft, nondistended. No hepatosplenomegaly. No bruits or masses. No BS Driveline: C/D/I; securement device intact and driveline secure Extremities: no cyanosis, clubbing, rash, trace LE edema Neuro: alert & orientedx3, cranial nerves grossly intact. moves all 4 extremities w/o difficulty. Affect pleasant  Telemetry: SR 80s  Labs: Basic Metabolic Panel:  Recent Labs Lab 09/18/13 0326  09/18/13 1518 09/18/13 1535 09/18/13 2117 09/18/13 2118 09/19/13 0350 09/19/13 1556 09/20/13 0430  NA 136*  < > 141 141 141  --  140 142 141  K 4.0  < > 3.4* 3.2* 4.4  --  5.0 4.9 5.0  CL 100  --  104  --  103  --  102 104 103  CO2 23  --  25  --   --   --  22 22 20   GLUCOSE 116*  < > 99 98 118*  --  112* 100* 174*  BUN 19  --  18  --  22  --  25* 32* 39*  CREATININE 1.40*  --  1.14  --  1.40* 1.27 1.47* 1.83* 2.06*  CALCIUM 8.4  --  7.7*  --   --   --  8.2* 8.9 8.7  MG  --   --  1.9  --   --  3.2* 2.7* 2.5 2.7*  PHOS  --   --   --   --   --   --  5.1*  --  5.6*  < > = values in this interval not displayed.  Liver Function Tests:  Recent Labs Lab 09/13/13 1349 09/19/13 0350 09/20/13 0430  AST 116* 428* 417*  ALT 109* 162* 270*  ALKPHOS 162* 69 82  BILITOT 0.9 4.3* 5.7*  PROT 6.9 5.7* 6.0  ALBUMIN 3.0* 3.1* 3.0*   No results found for this basename: LIPASE, AMYLASE,  in the last 168 hours No results found for this basename: AMMONIA,  in the last 168 hours  CBC:  Recent Labs Lab 09/13/13 1349  09/18/13 1518  09/18/13 2117 09/18/13 2118 09/19/13 0350 09/19/13 1556 09/20/13 0430  WBC 6.8  < > 12.3*  --   --  13.3* 13.8* 10.2 12.2*  NEUTROABS 5.3  --   --   --   --   --  12.8*  --  11.5*  HGB 9.0*  < > 9.0*  < > 6.5* 5.0* 8.1* 7.9* 9.1*  HCT 29.0*  < > 27.2*  < > 19.0* 15.4* 24.1* 23.0* 27.0*  MCV 82.4  < > 81.2  --   --  81.9 82.0 81.6 82.6  PLT 264  <  > 127*  --   --  176 158 132* 140*  < > = values in this interval not displayed.  INR:  Recent Labs Lab 09/13/13 1349 09/18/13 1500 09/19/13 0350 09/20/13 0430  INR 1.29 1.47 1.45 1.42     Imaging: Dg Chest Port 1 View  09/19/2013   CLINICAL DATA:  New chest tube insertion.  EXAM: PORTABLE CHEST - 1 VIEW  COMPARISON:  Chest radiograph September 19, 2013 at 3:59 a.m.  FINDINGS: Interval placement of right chest tube with side port projecting within the chest wall. Near-complete interval evacuation of the right pleural effusion. Left apical chest tube remains in place, no pneumothorax.  The cardiac silhouette remains moderately enlarged, status post median sternotomy for apparent coronary artery bypass grafting. Dual lead left cardiac pacemaker in situ. Right internal jugular central venous catheter with to lines, 1 of which is the swans Ganz catheter with distal tip projecting in main pulmonary artery, an aortic balloon pump marker projects at inferior aspect of aortic knob. Endotracheal tube tip projects 3.3 cm above the carina. Nasogastric tube in place, side port not visualized but past the gastroesophageal junction. Apparent mediastinal drain in place.  Diffuse interstitial prominence with decreased bilateral alveolar airspace opacities. Multiple EKG lines overlie the patient and may obscure subtle underlying pathology. Soft tissue planes and included osseous structures are nonsuspicious.  IMPRESSION: New right chest tube with side port projecting within the chest wall with near complete evacuation of right pleural effusion. No apparent change remaining life support line. No pneumothorax.  Stable cardiomegaly, with similar interstitial prominence, and decreased superimposed alveolar airspace opacities.   Electronically Signed   By: Elon Alas   On: 09/19/2013 06:06   Dg Chest Port 1 View  09/19/2013   CLINICAL DATA:  Follow-up status post nasal-tracheal suction.  EXAM: PORTABLE CHEST - 1  VIEW  COMPARISON:  Chest radiograph performed 09/18/2013  FINDINGS: The patient's endotracheal tube is seen ending 3-4 cm above the carina. A right IJ Swan-Ganz catheter is seen ending at the main pulmonary outflow tract. A second right IJ line is noted ending about the mid SVC. An enteric tube is seen similar diaphragm. A mediastinal drain and left apical chest tube are also seen. An intra-aortic balloon pump tip is noted just below the aortic knob. A  ventricular assist device is partially imaged. A pacemaker/AICD is noted overlying the left chest wall, with leads ending overlying the right atrium and right ventricle.  There is an increasing moderate right-sided pleural effusion, layering posteriorly along the right lung. Underlying airspace opacification is again seen; left-sided airspace opacity appears mildly worsened. This may reflect pulmonary edema or possibly infection. No definite pneumothorax is identified.  The cardiomediastinal silhouette is mildly enlarged. The patient is status post median sternotomy. No acute osseous abnormalities are seen.  IMPRESSION: Increasing moderate right-sided pleural effusion. Left-sided airspace opacification appears mildly worsened, while right-sided airspace opacity is difficult to fully characterize due to the effusion. This may reflect worsening pulmonary edema or possibly infection. Underlying mild cardiomegaly noted. Lines and tubes as described above.   Electronically Signed   By: Garald Balding M.D.   On: 09/19/2013 04:22   Dg Chest Port 1 View  09/18/2013   CLINICAL DATA:  Postop bleeding.  EXAM: PORTABLE CHEST - 1 VIEW  COMPARISON:  DG CHEST 1V PORT dated 09/18/2013; DG CHEST 1V PORT dated 09/14/2013  FINDINGS: 2214 hr. Endotracheal tube terminates 5.1 cm above chronic. Pacer/AICD device. Right IJ Swan-Ganz catheter with tip at pulmonary outflow tract. Intra-aortic balloon pump which is unchanged in position. Ventricular assist device which is also not  significantly changed.  Bilateral chest tubes. Nasogastric tube which likely extends beyond the inferior aspect of the film.  Cardiomegaly accentuated by AP portable technique. Apparent high mediastinal soft tissue fullness could be due to AP portable technique. No pneumothorax. Suspect developing loculated pleural fluid. Moderate and progressive interstitial and alveolar edema, greater on the right.  IMPRESSION: Overall worsened aeration, with progressive congestive heart failure.  Suspect developing loculated right-sided pleural fluid superiorly and laterally.  Stable support apparatus, without pneumothorax.  Superior mediastinal soft tissue fullness which could be due to AP portable technique or postoperative edema. Recommend attention on follow-up.   Electronically Signed   By: Abigail Miyamoto M.D.   On: 09/18/2013 23:01   Dg Chest Port 1 View  09/18/2013   CLINICAL DATA:  Postop from placement of a ventricular assist device.  EXAM: PORTABLE CHEST - 1 VIEW  COMPARISON:  09/17/2013  FINDINGS: Cardiac surgery and placement of a left ventricular assist device has been performed since the prior exam. The cardiac silhouette is mildly enlarged, but stable. There is no mediastinal widening. Hazy airspace opacity projects throughout the perihilar regions consistent with mild edema.  An endotracheal tube has its tip projecting 4.2 cm above the carina. A right internal jugular Swan-Ganz catheter has its tip in the main pulmonary artery. A second right internal jugular central venous line has its tip in the mid superior vena cava. There is an intra aortic balloon pump with its tip just below the aortic knob. A left chest tube has its tip at the left apex. Another tube extends over the left hemidiaphragm with another adjacent to the medial right hemidiaphragm. A nasogastric tube passes below the diaphragm into the stomach.  No gross pneumothorax is noted on the supine study.  IMPRESSION: Status post cardiac surgery and left  ventricular assist device placement.  Mild central pulmonary edema is noted.  Support apparatus is well positioned as detailed.   Electronically Signed   By: Lajean Manes M.D.   On: 09/18/2013 16:41     Medications:     Scheduled Medications: . acetaminophen  1,000 mg Oral Q6H   Or  . acetaminophen (TYLENOL) oral liquid 160 mg/5 mL  1,000  mg Per Tube Q6H  . antiseptic oral rinse  15 mL Mouth Rinse QID  . aspirin EC  325 mg Oral Daily   Or  . aspirin  324 mg Per Tube Daily   Or  . aspirin  300 mg Rectal Daily  . bisacodyl  10 mg Oral Daily   Or  . bisacodyl  10 mg Rectal Daily  . budesonide-formoterol  2 puff Inhalation BID  . chlorhexidine  15 mL Mouth/Throat BID  . docusate sodium  200 mg Oral Daily  . ezetimibe  10 mg Oral Daily  . insulin aspart  0-24 Units Subcutaneous Q4H  . levalbuterol  1.25 mg Nebulization Q6H  . levothyroxine  150 mcg Oral Custom   And  . levothyroxine  75 mcg Oral Custom  . metoCLOPramide (REGLAN) injection  10 mg Intravenous Q6H  . pantoprazole (PROTONIX) IV  40 mg Intravenous Q24H  . piperacillin-tazobactam (ZOSYN)  IV  3.375 g Intravenous Q8H  . sodium chloride  3 mL Intravenous Q12H  . vancomycin (VANCOCIN) 750 mg IVPB  750 mg Intravenous Q12H    Infusions: . sodium chloride 20 mL/hr at 09/19/13 0400  . sodium chloride 10 mL/hr at 09/18/13 2000  . sodium chloride    . dexmedetomidine 0.5 mcg/kg/hr (09/20/13 0530)  . DOPamine 3 mcg/kg/min (09/20/13 0530)  . EPINEPHrine infusion 2 mcg/min (09/20/13 0500)  . lactated ringers 20 mL/hr at 09/19/13 0809  . milrinone 0.1 mcg/kg/min (09/20/13 0500)  . norepinephrine (LEVOPHED) Adult infusion Stopped (09/20/13 0500)    PRN Medications: sodium chloride, midazolam, morphine injection, ondansetron (ZOFRAN) IV, oxyCODONE, potassium chloride, sodium chloride, zolpidem   Assessment:   1. 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 Acute on chronic renal  failure, stage III  4. Acute blood loss anemia, expected - total 4 PRBCs, 1 FFP 5. AKI 6. Persistent fever    Plan/Discussion:    Stable overnight. CT drainage has slowed down and now about 30-50 cc/hr. Received 1 PRBC yesterday evening and Hgb stable at 9.1. Levophed was weaned off last night, however PI now in the 2's and Cr has jumped from 1.8 to 2.06. Will turn back on to keep MAPs >75. Concerned with continued temps that this is also is contributing to vasodilitation and levophed will help. Continues on milrinone 0.1, would not increase d/t vasodilatory properties. Continue dopamine 3 mcg for renal. May need to turn speed down again to 8600 for now. He is volume overloaded about 20 lbs from baseline, however third spacing. Want to keep even or slightly negative at this time, do not want to pull off too quickly.   While seeing patient nitric was turned off and after about 10 min O2 sats dropped to 85%, will turn back on for now until Dr. Darcey [name] assess patient. Goal to extubate today.   INR 1.4, pharmacy to dose. Continue SCDs.  I reviewed the LVAD parameters from today, and compared the results to the patient's prior recorded data.  No programming changes were made.  The LVAD is functioning within specified parameters.  The patient performs LVAD self-test daily.  LVAD interrogation was negative for any significant power changes, alarms or PI events/speed drops.  LVAD equipment check completed and is in good working order.  Back-up equipment present.   LVAD education done on emergency procedures and precautions and reviewed exit site care.  Length of Stay: 7  Rande Brunt NP-C 09/20/2013, 6:56 AM Advanced  Heart Failure Team Pager [phone] (M-F; [place])  Please contact [facility] for night-coverage after hours (4p -7a ) and weekends on [email or web address]  VAD Team Pager [phone] (7am - 7am) For VAD issues only.  Patient seen with NP, agree with the above note.   PI lower and  creatinine higher.  CI is reasonable.   - Agree with the above that with the fever there is likely a vasodilatory component.  Would restart norepinephrine as above and would also stop milrinone given vasodilatory properties.  Will get inotropy from dopamine. Continue current pump speed (PI actually immediately higher after restarting norepinephrine.  - Hold Lasix for now.   Oxygen saturation dropped off nitric oxide, will resume this morning.    Extubation today likely.  Continue empiric abx, no definite source for low-grade fever.    Loralie Champagne 09/20/2013 7:50 AM

## 2013-09-20 NOTE — Evaluation (Signed)
Physical Therapy Evaluation Patient Details Name: Johnny Oneill MRN: [id number] DOB: 26-Dec-1940 Today's Date: 09/20/2013 Time: [id number] PT Time Calculation (min): 28 min  PT Assessment / Plan / Recommendation History of Present Illness  Johnny Oneill is a 73 year old male with history of CAD s/p stent to LCX 2004 with re-occlusion of LCX with lateral MI 2009 and repeat stenting, total occlusion of RCA, 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 S/P L TKR 2013. Last Cath 2009..  [name]  Clinical Impression  Pt admitted LVAD implantation. Pt currently with functional limitations due to the deficits listed below (see PT Problem List).  Pt will benefit from skilled PT to increase their independence and safety with mobility to allow discharge to the venue listed below.       PT Assessment  Patient needs continued PT services    Follow Up Recommendations  Home health PT;Supervision/Assistance - 24 hour;Supervision for mobility/OOB    Does the patient have the potential to tolerate intense rehabilitation      Barriers to Discharge        Equipment Recommendations  Other (comment) (TBA during acute stay)    Recommendations for Other Services     Frequency Min 5X/week    Precautions / Restrictions Precautions Precautions: Sternal Restrictions Weight Bearing Restrictions: Yes   Pertinent Vitals/Pain Pt started session with PI of 5.5.  Pt sat EOB for approx 10 min at which time PI slowly dropped to around 3.4-3.5 and held without pt reporting any dizziness or feeling poorly. Eventually the PI dropped to a low of 2.9 and pt was assisted back to supine.       Mobility  Bed Mobility Overal bed mobility: Needs Assistance Bed Mobility: Supine to Sit;Sit to Supine Supine to sit: +2 for physical assistance;Total assist Sit to supine: Total assist;+2 for physical assistance General bed  mobility comments: pt intitiated movement, but needed assist due to amount of lines/tubes and pt not knowing what he is allowed to do.    Exercises General Exercises - Lower Extremity Long Arc Quad: AROM;5 reps;Both;Seated Heel Slides: AAROM;Both;10 reps;Supine   PT Diagnosis: Generalized weakness;Other (comment) (decr activity tolerance)  PT Problem List: Decreased strength;Decreased activity tolerance;Decreased mobility;Decreased knowledge of use of DME;Decreased knowledge of precautions;Cardiopulmonary status limiting activity;Pain PT Treatment Interventions: DME instruction;Gait training;Functional mobility training;Stair training;Therapeutic activities;Balance training;Patient/family education     PT Goals(Current goals can be found in the care plan section) Acute Rehab PT Goals Patient Stated Goal: back to more function...waiting for heart PT Goal Formulation: With patient Time For Goal Achievement: 10/04/13 Potential to Achieve Goals: Good  Visit Information  Last PT Received On: 09/20/13 Assistance Needed: +2 History of Present Illness: Johnny Oneill is a 73 year old male with history of CAD s/p stent to LCX 2004 with re-occlusion of LCX with lateral MI 2009 and repeat stenting, total occlusion of RCA, 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 S/P L TKR 2013. Last Cath 2009..  [name]       Prior [place] expects to be discharged to:: Private residence Living Arrangements: Spouse/significant other Available Help at Discharge: Family Type of Home: House Home Access: Stairs to enter [employer] of Steps: 2 (in back  no rail 3 in front with rail) Entrance Stairs-Rails: None Home Layout: Two level;Able to live  on main level with bedroom/bathroom Alternate Level Stairs-Number of Steps: flight Alternate Level Stairs-Rails:  Left;Right Home Equipment: Walker - 2 wheels;Bedside commode;Tub bench Prior Function Level of Independence: Independent with assistive device(s) Comments: driving, out and about Communication Communication: No difficulties    Cognition  Cognition Arousal/Alertness: Awake/alert Behavior During Therapy: WFL for tasks assessed/performed;Flat affect Overall Cognitive Status: Within Functional Limits for tasks assessed (mild confusion)    Extremity/Trunk Assessment Upper Extremity Assessment Upper Extremity Assessment: Defer to OT evaluation Lower Extremity Assessment Lower Extremity Assessment: Generalized weakness (bil stiffness and mild general weakness)   Balance Balance Overall balance assessment: No apparent balance deficits (not formally assessed);Needs assistance Sitting-balance support: Feet supported;No upper extremity supported (feet not well supported, pt with arms resting in his lap) Sitting balance-Leahy Scale: Fair Sitting balance - Comments: able to sit EOB unsupported for short period and needed minimal assist as time progressed.  Increased assist while pt completing LAQ at EOB Postural control: Posterior lean (mild)  End of Session PT - End of Session Equipment Utilized During Treatment: Oxygen Activity Tolerance: Patient tolerated treatment well;Patient limited by fatigue Patient left: in bed;with call bell/phone within reach;with nursing/sitter in room Nurse Communication: Mobility status  GP     [name], Tessie Fass 09/20/2013, 11:26 AM  09/20/2013  Donnella Sham, [place] [phone]  (pager)

## 2013-09-20 NOTE — Progress Notes (Signed)
Pharmacy Consult Note  Pharmacy Consult for vancomycin and zosyn Indication: Rule out sepsis, Fever of unknown source post LVAD  Allergies  Allergen Reactions  . Diovan [Valsartan] Other (See Comments)    Hypotension at low dose, hyperkalemia  . Lipitor [Atorvastatin Calcium] Other (See Comments)    Muscle pain    Patient Measurements: Height: 5\' 7"  (170.2 cm) Weight: 171 lb 8.3 oz (77.8 kg) (top sheet, bottom sheet, bath blanket & controller) IBW/kg (Calculated) : 66.1   Vital Signs: Temp: 100.4 F (38 C) (01/21 0700) Temp src: Core (Comment) (01/21 0400) Pulse Rate: 96 (01/21 0700)  Labs:  Recent Labs  09/17/13 1015 09/18/13 0326  09/18/13 1500  09/19/13 0350 09/19/13 1556 09/20/13 0430  HGB  --  9.8*  < >  --   < > 8.1* 7.9* 9.1*  HCT  --  30.6*  < >  --   < > 24.1* 23.0* 27.0*  PLT  --  213  < > 126*  < > 158 132* 140*  APTT  --   --   --  39*  --   --   --   --   LABPROT  --   --   --  17.4*  --  17.3*  --  17.0*  INR  --   --   --  1.47  --  1.45  --  1.42  HEPARINUNFRC 0.28* 0.22*  --   --   --   --   --   --   CREATININE  --  1.40*  --   --   < > 1.47* 1.83* 2.06*  < > = values in this interval not displayed.  Estimated Creatinine Clearance: 31.2 ml/min (by C-G formula based on Cr of 2.06).  Assessment: Johnny Oneill is a 73 year old male with history of CAD s/p stent to LCX 2004 with re-occlusion of LCX with lateral MI 2009 and repeat stenting, total occlusion of RCA, chronic systolic heart failure EF 20% on chronic Milrinone.   ID: Patient began to have fevers over weekend with possible concern for early sepsis and was placed on vancomycin and zosyn. Patient is now s/p LVAD on 1/19 and has continued to have fevers and wbc is now up to 12. Cxs have been negative to date. Patient's renal function has declined so check a vancomycin level this morning which was elevated, will adjust to a daily regimen.  1/17 vanc>> 1/17 zosy>> 1/19 fcx x1 1/20 rifampin  x1  1/17 Bcx2>>ngtd 1/14 Resp flu pcr negative 1/20 resp - ngtd  Anticoagulation: s/p LVAD [id number], bleeding complications noted intraop, requiring three units of blood early am on 1/20 and 1 unit last night. Hgb is currently 9.1 and no overt source of bleeding has been identified. CVTS ok with starting low dose warfarin tonight, INR has consistently been 1.4 since date of surgery. No interacting drugs noted currently.   Goal of Therapy:  Vancomycin trough goal 15-20 INR 2-3 Monitor platelets by anticoagulation protocol: Yes   Plan:  1. Change vancomycin to 1250mg  IV q 24 hours 2. No change needed in zosyn dosing 3. Warfarin 2.5mg  tonight 4. Daily INR 5. Will follow up on warfarin education once out of ICU and stable  Erin Hearing PharmD., BCPS Clinical Pharmacist Pager [phone] 09/20/2013 10:07 AM

## 2013-09-20 NOTE — Progress Notes (Signed)
CLINICAL SOCIAL WORK DOCUMENTATION LVAD (Left Ventricular Assist Device) Psychosocial Screening Please remember that all information is confidential within the members of the VAD team and [facility]  09/20/2013 8:44:59 AM  Patient:  Johnny Oneill  MRN:  [id number]  Account:  [id number]  Clinical Social Worker:  Ulla Gallo, LCSW Date/Time Initiated:   09/15/2013 10:30 AM Referral Source:    Zada Girt, VAD Coordinator  Referral Reason:   LVAD Placement  Source of Information:   Patient, Johnny Oneill (wife)   PATIENT DEMOGRAPHICS NAME:   Johnny Oneill     DOB:  Nov 13, 1940  SS#:  [id number] Address:   [address]  [place]  [place] [address] Home Phone:  [phone]  Cell Phone:   [phone] Marital Status:   married (27 years)     Primary Language:  ENGLISH  Faith:  [employer] Adherence with Medical Regimen:   Patient maintains adherence to medical regimen  Medication Adherence:   Patient prefills own medication boxes  Physician appointment attendance:   Pateint denies any concerns with medical follow up.   Do you have a Living Will or Medical POA?  Y Would you like to complete a Living Will and Medical POA prior to surgery?  N Are you currently a DNR?  N Do you have a MOST form?  N Would you like to review one?  Y Do you have goals of care?  Y   Have you had a consult with the palliative care team at [facility]?  Y Comment:  Patient reports he will be meeting with Johnny Lessen, NP from the Palliative Medicine Team.  Psychological Health Appearance:   Patient was sitting up in bed and was well groomed and neat appearance.  Mental status:   Alert and Oriented  Eye Contact:   Excellent  Thought Content:   Patient verbalized thoughts clearly and answered appropriately  Speech:   Strong and clear  Mood:   Patient was in good spirits with good sense of humor.  Affect:   Positive affect  Insight:   Patient was realistic about upcoming  procedure and life afterwards  Judgment:   sound judgement  Interaction Style:   Patient was realistic about upcoming procedure and life afterwards   Family/Social Information Who lives in your home? Name9  Johnny Oneill   Age9  [phone]   Relationship to you9  wife    Do you have a plan for child care if relevant?   N/A  List family members outside the home (parents, friends, pastor, etc..) [place]   Age2  84yo  44yo  73yo   Relationship to you2  daughter  step son  sister    Please list people who give you emotional support (family, parents, friends, pastor, etc..) Name3  "Church members"  Johnny Oneill    Relationship to you3    neighbor-back up caregiver    Who is your primary and backup support pre and post-surgery? Explain the relationships i.e. strengths/weakness, etc.:   Johnny Oneill- Patient reports she is very supportive and familiar with caregiving needs.  Secondary Caregiver identified:   Johnny Oneill- back-up- lives across the street and is very supportive to both patient and wife. Phone# [phone]   Legal Do you currently have any legal issues/problems?   no  Durable POA or Legal Next of Kin:   Johnny Oneill  [phone] home  [phone] cell   Living situation Travel distance to [facility]:  12 miles  Second Hand Smoke Exposure:   none  Self- Care level:   Independent although needs some assistance with bathing due to PICC line insertion in April, 2014.  Ambulation:   Independent  Transportation:   Wife will drive until patient is cleared.  Limitations:   none noted  Barriers impacting ability to participate in care:   none noted   Community Are you active with community agencies/resources?   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  Are you active in a church, synagogue, mosque, or other faith based community?   He reports that he is  active with [employer] and his wife was previously Solicitor at [employer].  What other sources do you have for spiritual support?   wife  Are you active in any clubs or social organizations?   "Basketball was my life". He reports social outings with the referee's after games.  What do you do for fun? hobbies, interests?  He states he loves to play golf and basketball.   Education/Work information What is the last grade of school you completed?  [employer]  Preferred method of learning? (Written, verbal, hands on):   hands on  Do you have any problems with reading or writing?  denies any concerns  Are you currently employed? If no, when were you last employed?   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   -data processing  -[employer]  How long have you worked there?   31 years- retired  If you are not currently working, do you plan to return to work after VAD surgery?   not at this time  If yes, what type of employment do you hope to find?   n/a  Are you interested in job training or learning new skills?   n/a  Did you serve in the [employer]? If so, what branch?   no   Financial Information What is your source of income?    Social Security retirement and [employer]  Do you have difficulty meeting your monthly expenses?   He states that most of the time he budgets well but sometimes the month of January can require creative budgeting due to meeting new deductibles.  If yes, which ones?    did not specify specific bills  How do you usually cope with this?    Make neccessary adjustments to meet the budget.  Primary Health Insurance:     Medicare  Secondary Insurance:    BC/BS  Have you applied for Medicaid?    not needed  Have you applied for Social Security Disability?    n/a  Do you have prescription coverage?    yes  What are your prescription co-pays?    varies  Are you required to use a certain pharmacy?    usually use [facility] [place]  Do you have a mail order option for your prescriptions?    Yes  If yes, what pharmacy do you use for mail order?    [facility] for 90 day prescriptions  Have you ever refused medication due to cost?  no  Discuss monthly cost for dressing supplies post procedure $150-300    Discussed and Reviewed and patient denies any concerns.  Can you budget for this monthly expense?    yes   Medical Information Briefly describe your medical history, surgeries and why you are here for evaluation.    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 He states he did well after until 10/2012 when his heart failure began to decline. He began home milrinone in 11/2012 and has been assessed for a heart transplant at [facility].  Are you able to complete your ADL's?    Patient reports no difficulty although requires some assistance from his wife with bathing due to PICC line.  Do you have any history of emotional, medical, physical or verbal trauma?    Patient denies any trauma.  Do you have any family history of heart problems?    He reports significant heart history on his maternal side.  Do you smoke? If so, what is the amount and frequency?    He states that he was a social smoker although quit years ago.  Do you drink alcohol? If so, how many drinks a day/week?    He reports that he has a drink a couple times a year at social events.  Have you ever used illegal drugs or misused medications?    He denies any drug use.  If yes, what drugs do you use and how often?    n/a  Have you ever been treated  for substance abuse?    He denies ant history of drug treatment.  If yes, where and when were you treated?    n/a  Are you currently using illegal substances?    no   Mental Health History Have you ever had problems with depression, anxiety or other mental health issues?   He spoke about depressed feelings after his first marriage ended in divorce but denies having had any problems with depression.  If yes, have you seen a counselor, psychiatrist or therapist?    n/a  If you are currently experiencing problems are you interested in talking with a professional?    denies need at present  Would you be interested in participating in an LVAD support group?  Y LVAD support group for: Patient Caregivers  Have you or are you taking any medications for anxiety/depression or any mental health concerns?    no  If yes, Please list the medications?    n/a  How have you been feeling in the past year?    He states since strating the milrinone he has been feeling great.  How do you handle stressful situations?   "My wife helps me"  What are your coping strategies? Please List:    "ask my wife for help"  Have you had any past or current thoughts of suicide?    none  How many hours do you sleep at night?    7 hours  How is your appetite?   Great- "even eat all the hospital food"  PHQ2- Depression Screen:    0  PHQ9 Depression screen (only complete if the PHQ2 is positive):    n/a   Plan for VAD Implementation Do you know and understand what happens during VAD surgery?  Please describe your thoughts:   Patient spoke at length about his visit to the home of a VAD recipient. He spoke of  the technology as well as the physical/emotional factors that will affect him with living with a LVAD.  What do you know about the risks of any major surgery or use of general anesthesia?    Patient stated that he was aware of the risks but didn't want to talk too much about them.  What do you know about the  risks and side effects associated with VAD surgery?    Patient verbalized understanding of the risks.  Explain what will happen right after surgery?    Patient understands intubation, ICU stay and then transfer to unit until ready for discharge.  Information obtained from:    Patient and wife  What is your plan for transportation for the first 8 weeks post-surgery? (Patients are not recommended to drive post-surgery for 8 weeks)    Wife will provide all transportation needs.  Driver: Johnny Oneill  Valid license:    yes  Working Conservator, museum/gallery:    [place] 2004, [place] 2009  Airbags:  all 3 cars have airbags Do you plan to disarm the airbags- there is a risk of discharging the device if the airbag were to deploy.   Patient reports that he is unsure at this time about airbags but will sit in the back seat until he discusses further with his wife about plan for airbags.  What do you know about your diet post-surgery?    Patient reports heart healthy diet.  How do you plan to monitor your medications, current and future?    Patient will continue to pre pour his medication boxes.  How do you plan to complete ADL's post-surgery (Shower, dress, etc.)?    He will continue to have his wife assist with bathing and dressing until he is confident he can complete independently.  Will it be difficult to ask for help for your caregivers? If so, explain:    No- "already need help"  Please explain what you hope will be improved about your life as a result of receiving the VAD    "I am hopeful my agility will improve and be able to walk further" "I am looking forward to being able to do more things"  Please tell me your biggest concern or fear about receiving the VAD?    "power loss"  How do you cope with your concerns/fears?    My wife helps me through. He also stated that his step son has a generator and if they loss power they will go to his house.  Please  explain your understanding of how your body will change? Are you worried about these changes?    Patient denied any worries regarding body changes. He states "as long as I am living, I'm fine"  Do you see any barriers to your surgery or follow up? If yes, please explain:   Patient denies any barriers other than showering.     Understanding of LVAD Surgical procedures and risks:    Discussed and reviewed  Electrical need for LVAD:    Patient verbalizes understanding of electrical needs, 3 prong outlets and reports has access to a generator at his step son's home who lives 20 minutes away.  Safety precautions with LVAD (Water, etc.):   discussed and reviewed  Potential side effects with LVAD:    discussed and reviewed  Types of Advanced heart failure therapies available:    discussed and reviewed  LVAD daily self-care (dressing changes, computer check, extra supplies):    Patient verbalized  understanding of dressing changes, need for supplies and ADL support.  Outpatient follow-up (follow-up in LVAD clinic; monitoring blood thinners)    discussed and reviewed  Need for emergency planning:    discussed and reviewed  Expectations for LVAD:    Patient appears realistic and stated "I am glad that we are gong ahead with this".  Current level of motivation to prepare for LVAD:    Patient appears positive and motivated to improve his quality of life with the LVAD.  Patient's perception of need for LVAD:    accurate and realistic  Present level of consent for LVAD:    Highly motivated  Reasons for seeking LVAD:    Patient is looking for a better quality of life.   Psychosocial Protective Factors  Mr. Easter appears to have a positive outlook and great sense of humor along with a very supportive wife to assist him through the LVAD process. He has a very supportive Camera operator and active social life to assist with support and motivation. 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 He is hopeful in time to return to his part time work. He denies any financial issues and is compliant with medications and medical regimen. He has no history of depression, anxiety, substance abuse or psychiatric concerns. He has reliable transportation and strong family support for recovery process. He verbalized understanding of electrical needs and has emergency plan with back up generator. He is highly motivated to improve his quality of life and appears to have a good understanding of procedure and recovery. Psychosocial Risk Factors  Mr. Kerlin denies any risk factors and is looking forward to improved quality of life.  Clinical Intervention: CSW will provide support and evaluate needs throughout the recovery process.   Educated patient/family on the following Caregiver(s) role responsibilities:   Discussed caregiver role with wife who denies any concerns. Wife has been assisting patient with bathing due to PICC line for a few months now and has had some caregiving experience in the past. She felt very confident in learning the care needs of the LVAD.  Financial planning for LVAD:    Discussed potential financial needs of the LVAD and will continue to evaluate throughout the process.  Role of Clinical Social Worker:    CSW discussed role and offered support throughout the recovery process.  Signs of depression and anxiety:    Discussed signs and symptoms and follow up should the need arise. No signs or symptoms at present.  Support planning for LVAD:    Discussed and reviewed  LVAD process:    Discussed and reviewed- most information provided by LVAD Coordinator  Caregiver contract/agreement:   Discussed and reviewed- information and contract provided by LVAD Coordinator  Discussed Referral(s) to:    Discussed LVAD support group. Patient very interested and will explore post hospitalization.  Community Resources:    None at this time- will explore  needs throughout recovery.  Clinical Impression Recommendations:    Mr. Zahorik is an excellent candidate for LVAD implantation. He verbalized understanding of the complex procedure and recovery process and compliance to medical regimen. He has a very supportive family and church community to help with recovery and motivation. He is a very active man who is looking forward to improved quality of life and involvement with his church community. He has no financial concerns related to VAD recovery. He has no history of substance abuse or mental health needs and  has a positive outlook and good coping skills for LVAD success. He does not appear to have any psychosocial risks factors at present. CSW will continue to follow for support and any other needs that may arise during recovery process.  Raquel Sarna, [place]

## 2013-09-20 NOTE — Consult Note (Signed)
I have reviewed and discussed the care of this patient in detail with the nurse practitioner including pertinent patient records, physical exam findings and data. I agree with details of this encounter.  

## 2013-09-20 NOTE — Evaluation (Signed)
Occupational Therapy Evaluation Patient Details Name: Johnny Oneill MRN: [id number] DOB: May 05, 1941 Today's Date: 09/20/2013 Time: [id number] OT Time Calculation (min): 28 min  OT Assessment / Plan / Recommendation History of present illness Johnny Oneill is a 73 year old male with history of CAD s/p stent to LCX 2004 with re-occlusion of LCX with lateral MI 2009 and repeat stenting, total occlusion of RCA, 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 S/P L TKR 2013. Last Cath 2009..  [name]   Clinical Impression   Pt admitted with above.  He demonstrates the below listed deficits and will benefit from continued OT to increase independence with BADLs.  PI starting 5.5.  Decreased to 3.4-3.6, but after ~ 10 mins it dropped to 2.6.  Pt returned to supine at that time.  Pt with HR 94%, and denies dizziness.     OT Assessment  Patient needs continued OT Services    Follow Up Recommendations  No OT follow up;Supervision/Assistance - 24 hour    Barriers to Discharge      Equipment Recommendations  Other (comment) (will need a rollator)    Recommendations for Other Services    Frequency  Min 3X/week    Precautions / Restrictions Precautions Precautions: Sternal;Other (comment) (LVAD) Precaution Comments: Multiple lines; LVAD equipment   Pertinent Vitals/Pain     ADL  Eating/Feeding: NPO Grooming: Wash/dry face;Moderate assistance (due to multiple lines) Where Assessed - Grooming: Supine, head of bed up Upper Body Bathing: +1 Total assistance Where Assessed - Upper Body Bathing: Supported sitting Lower Body Bathing: +1 Total assistance Where Assessed - Lower Body Bathing: Supine, head of bed up;Rolling right and/or left Upper Body Dressing: +1 Total assistance Where Assessed - Upper Body Dressing: Supine, head of bed up;Supported sitting Lower Body Dressing: +1 Total  assistance Where Assessed - Lower Body Dressing: Supine, head of bed up;Rolling right and/or left Toilet Transfer: +1 Total assistance (unable) Toileting - Clothing Manipulation and Hygiene: +1 Total assistance Where Assessed - Toileting Clothing Manipulation and Hygiene: Supine, head of bed flat;Rolling right and/or left Transfers/Ambulation Related to ADLs: Not attempted this date due to pt extubated this am, and PI decreased to 3.0 while sitting EOB without recovering ADL Comments: Pt instructed in sternal precautions    OT Diagnosis: Generalized weakness  OT Problem List: Decreased strength;Decreased activity tolerance;Decreased knowledge of use of DME or AE;Decreased knowledge of precautions;Cardiopulmonary status limiting activity OT Treatment Interventions: Self-care/ADL training;DME and/or AE instruction;Therapeutic activities;Patient/family education   OT Goals(Current goals can be found in the care plan section) Acute Rehab OT Goals Patient Stated Goal: to get better OT Goal Formulation: With patient Time For Goal Achievement: 10/04/13 Potential to Achieve Goals: Good ADL Goals Pt Will Perform Grooming: with min guard assist;standing Pt Will Perform Upper Body Bathing: with supervision;sitting Pt Will Perform Lower Body Bathing: with min assist;sit to/from stand Pt Will Perform Upper Body Dressing: with supervision;sitting Pt Will Perform Lower Body Dressing: with min assist;sit to/from stand Pt Will Transfer to Toilet: with min guard assist;ambulating;bedside commode Pt Will Perform Toileting - Clothing Manipulation and hygiene: with min guard assist;sit to/from stand Additional ADL Goal #1: Pt will be independent with sternal precautions during all BADLs and functional mobility Additional ADL Goal #2: Pt will be independent with managing LVAD equipment   Visit Information  Last OT Received On: 09/20/13 Assistance Needed: +2 PT/OT/SLP Co-Evaluation/Treatment: Yes Reason  for Co-Treatment: For patient/therapist  safety;Complexity of the patient's impairments (multi-system involvement) OT goals addressed during session: Other (comment) (functional transfers) History of Present Illness: Johnny Oneill is a 73 year old male with history of CAD s/p stent to LCX 2004 with re-occlusion of LCX with lateral MI 2009 and repeat stenting, total occlusion of RCA, 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 S/P L TKR 2013. Last Cath 2009..  [name]       Prior Edwards expects to be discharged to:: Private residence Living Arrangements: Spouse/significant other Available Help at Discharge: Family Type of Home: House Home Access: Stairs to enter [employer] of Steps: 2 Entrance Stairs-Rails: None Home Layout: Two level;Able to live on main level with bedroom/bathroom Alternate Level Stairs-Number of Steps: flight Alternate Level Stairs-Rails: Left;Right Home Equipment: Walker - 2 wheels;Tub bench;Shower seat Prior Function Level of Independence: Independent with assistive device(s) Comments: driving, out and about Communication Communication: No difficulties         Vision/Perception     Cognition  Cognition Arousal/Alertness: Awake/alert Behavior During Therapy: WFL for tasks assessed/performed;Flat affect Overall Cognitive Status: Within Functional Limits for tasks assessed    Extremity/Trunk Assessment Upper Extremity Assessment Upper Extremity Assessment: Generalized weakness (gross assesment) Lower Extremity Assessment Lower Extremity Assessment: Defer to PT evaluation     Mobility Bed Mobility Overal bed mobility: Needs Assistance Bed Mobility: Supine to Sit;Sit to Supine Supine to sit: +2 for physical assistance;Total assist Sit to supine: Total assist;+2 for physical assistance General bed mobility  comments: pt intitiated movement, but needed assist due to amount of lines/tubes and pt not knowing what he is allowed to do. Transfers Overall transfer level:  (not performed)     Exercise General Exercises - Lower Extremity Long Arc Quad: AROM;5 reps;Both;Seated Heel Slides: AAROM;Both;10 reps;Supine   Balance Balance Overall balance assessment: No apparent balance deficits (not formally assessed) Sitting-balance support: Feet supported Sitting balance-Leahy Scale: Fair Sitting balance - Comments: able to sit EOB unsupported for short period and needed minimal assist as time progressed.  Increased assist while pt completing LAQ at EOB Postural control: Posterior lean   End of Session OT - End of Session Equipment Utilized During Treatment: Oxygen;Other (comment) (LVAD) Activity Tolerance: Patient limited by fatigue;Other (comment) (decreasing PI) Patient left: in bed;with call bell/phone within reach;with nursing/sitter in room Nurse Communication: Mobility status;Other (comment) (Nurse present throughout session )  [place], Ellard Artis M 09/20/2013, 2:04 PM

## 2013-09-21 ENCOUNTER — Inpatient Hospital Stay (HOSPITAL_COMMUNITY): Payer: Medicare Other

## 2013-09-21 ENCOUNTER — Encounter (HOSPITAL_COMMUNITY): Payer: Self-pay | Admitting: Cardiothoracic Surgery

## 2013-09-21 DIAGNOSIS — Z95818 Presence of other cardiac implants and grafts: Secondary | ICD-10-CM

## 2013-09-21 DIAGNOSIS — I5023 Acute on chronic systolic (congestive) heart failure: Secondary | ICD-10-CM

## 2013-09-21 LAB — TYPE AND SCREEN
ABO/RH(D): O NEG
Antibody Screen: NEGATIVE
Unit division: 0
Unit division: 0
Unit division: 0
Unit division: 0
Unit division: 0
Unit division: 0
Unit division: 0
Unit division: 0
Unit division: 0

## 2013-09-21 LAB — GLUCOSE, CAPILLARY
Glucose-Capillary: 102 mg/dL — ABNORMAL HIGH (ref 70–99)
Glucose-Capillary: 107 mg/dL — ABNORMAL HIGH (ref 70–99)
Glucose-Capillary: 110 mg/dL — ABNORMAL HIGH (ref 70–99)
Glucose-Capillary: 111 mg/dL — ABNORMAL HIGH (ref 70–99)
Glucose-Capillary: 113 mg/dL — ABNORMAL HIGH (ref 70–99)
Glucose-Capillary: 114 mg/dL — ABNORMAL HIGH (ref 70–99)
Glucose-Capillary: 115 mg/dL — ABNORMAL HIGH (ref 70–99)
Glucose-Capillary: 116 mg/dL — ABNORMAL HIGH (ref 70–99)
Glucose-Capillary: 116 mg/dL — ABNORMAL HIGH (ref 70–99)
Glucose-Capillary: 117 mg/dL — ABNORMAL HIGH (ref 70–99)
Glucose-Capillary: 128 mg/dL — ABNORMAL HIGH (ref 70–99)
Glucose-Capillary: 147 mg/dL — ABNORMAL HIGH (ref 70–99)
Glucose-Capillary: 151 mg/dL — ABNORMAL HIGH (ref 70–99)
Glucose-Capillary: 73 mg/dL (ref 70–99)
Glucose-Capillary: 91 mg/dL (ref 70–99)
Glucose-Capillary: 94 mg/dL (ref 70–99)
Glucose-Capillary: 95 mg/dL (ref 70–99)
Glucose-Capillary: 96 mg/dL (ref 70–99)
Glucose-Capillary: 96 mg/dL (ref 70–99)

## 2013-09-21 LAB — POCT I-STAT 3, ART BLOOD GAS (G3+)
Acid-base deficit: 1 mmol/L (ref 0.0–2.0)
Bicarbonate: 23.9 mEq/L (ref 20.0–24.0)
O2 Saturation: 94 %
Patient temperature: 36.2
TCO2: 25 mmol/L (ref 0–100)
pCO2 arterial: 40.4 mmHg (ref 35.0–45.0)
pH, Arterial: 7.376 (ref 7.350–7.450)
pO2, Arterial: 70 mmHg — ABNORMAL LOW (ref 80.0–100.0)

## 2013-09-21 LAB — COMPREHENSIVE METABOLIC PANEL
ALT: 442 U/L — ABNORMAL HIGH (ref 0–53)
AST: 528 U/L — ABNORMAL HIGH (ref 0–37)
Albumin: 2.9 g/dL — ABNORMAL LOW (ref 3.5–5.2)
Alkaline Phosphatase: 105 U/L (ref 39–117)
BUN: 50 mg/dL — ABNORMAL HIGH (ref 6–23)
CO2: 22 mEq/L (ref 19–32)
Calcium: 8.4 mg/dL (ref 8.4–10.5)
Chloride: 101 mEq/L (ref 96–112)
Creatinine, Ser: 2.02 mg/dL — ABNORMAL HIGH (ref 0.50–1.35)
GFR calc Af Amer: 37 mL/min — ABNORMAL LOW (ref >90)
GFR calc non Af Amer: 32 mL/min — ABNORMAL LOW (ref >90)
Glucose, Bld: 117 mg/dL — ABNORMAL HIGH (ref 70–99)
Potassium: 4 mEq/L (ref 3.7–5.3)
Sodium: 140 mEq/L (ref 137–147)
Total Bilirubin: 5.2 mg/dL — ABNORMAL HIGH (ref 0.3–1.2)
Total Protein: 6 g/dL (ref 6.0–8.3)

## 2013-09-21 LAB — CBC WITH DIFFERENTIAL/PLATELET
Basophils Absolute: 0 10*3/uL (ref 0.0–0.1)
Basophils Relative: 0 % (ref 0–1)
Eosinophils Absolute: 0 10*3/uL (ref 0.0–0.7)
Eosinophils Relative: 0 % (ref 0–5)
HCT: 26 % — ABNORMAL LOW (ref 39.0–52.0)
Hemoglobin: 8.8 g/dL — ABNORMAL LOW (ref 13.0–17.0)
Lymphocytes Relative: 4 % — ABNORMAL LOW (ref 12–46)
Lymphs Abs: 0.5 10*3/uL — ABNORMAL LOW (ref 0.7–4.0)
MCH: 28 pg (ref 26.0–34.0)
MCHC: 33.8 g/dL (ref 30.0–36.0)
MCV: 82.8 fL (ref 78.0–100.0)
Monocytes Absolute: 0.5 10*3/uL (ref 0.1–1.0)
Monocytes Relative: 4 % (ref 3–12)
Neutro Abs: 11.4 10*3/uL — ABNORMAL HIGH (ref 1.7–7.7)
Neutrophils Relative %: 92 % — ABNORMAL HIGH (ref 43–77)
Platelets: 125 10*3/uL — ABNORMAL LOW (ref 150–400)
RBC: 3.14 MIL/uL — ABNORMAL LOW (ref 4.22–5.81)
RDW: 17.4 % — ABNORMAL HIGH (ref 11.5–15.5)
WBC: 12.3 10*3/uL — ABNORMAL HIGH (ref 4.0–10.5)

## 2013-09-21 LAB — CBC
HCT: 26.3 % — ABNORMAL LOW (ref 39.0–52.0)
Hemoglobin: 9 g/dL — ABNORMAL LOW (ref 13.0–17.0)
MCH: 28 pg (ref 26.0–34.0)
MCHC: 34.2 g/dL (ref 30.0–36.0)
MCV: 81.9 fL (ref 78.0–100.0)
Platelets: 132 10*3/uL — ABNORMAL LOW (ref 150–400)
RBC: 3.21 MIL/uL — ABNORMAL LOW (ref 4.22–5.81)
RDW: 17.5 % — ABNORMAL HIGH (ref 11.5–15.5)
WBC: 12.9 10*3/uL — ABNORMAL HIGH (ref 4.0–10.5)

## 2013-09-21 LAB — CARBOXYHEMOGLOBIN
Carboxyhemoglobin: 1.7 % — ABNORMAL HIGH (ref 0.5–1.5)
Carboxyhemoglobin: 1.3 % (ref 0.5–1.5)
Methemoglobin: 1.9 % — ABNORMAL HIGH (ref 0.0–1.5)
Methemoglobin: 1.1 % (ref 0.0–1.5)
O2 Saturation: 50.7 %
O2 Saturation: 60.6 %
Total hemoglobin: 8.4 g/dL — ABNORMAL LOW (ref 13.5–18.0)
Total hemoglobin: 9.1 g/dL — ABNORMAL LOW (ref 13.5–18.0)

## 2013-09-21 LAB — BASIC METABOLIC PANEL
BUN: 52 mg/dL — ABNORMAL HIGH (ref 6–23)
CO2: 23 mEq/L (ref 19–32)
Calcium: 8.6 mg/dL (ref 8.4–10.5)
Chloride: 100 mEq/L (ref 96–112)
Creatinine, Ser: 1.84 mg/dL — ABNORMAL HIGH (ref 0.50–1.35)
GFR calc Af Amer: 41 mL/min — ABNORMAL LOW (ref >90)
GFR calc non Af Amer: 35 mL/min — ABNORMAL LOW (ref >90)
Glucose, Bld: 125 mg/dL — ABNORMAL HIGH (ref 70–99)
Potassium: 3.8 mEq/L (ref 3.7–5.3)
Sodium: 139 mEq/L (ref 137–147)

## 2013-09-21 LAB — MAGNESIUM: Magnesium: 2.6 mg/dL — ABNORMAL HIGH (ref 1.5–2.5)

## 2013-09-21 LAB — PHOSPHORUS: Phosphorus: 5.3 mg/dL — ABNORMAL HIGH (ref 2.3–4.6)

## 2013-09-21 LAB — LACTATE DEHYDROGENASE: LDH: 744 U/L — ABNORMAL HIGH (ref 94–250)

## 2013-09-21 LAB — CULTURE, RESPIRATORY W GRAM STAIN
Culture: NO GROWTH
Special Requests: NORMAL

## 2013-09-21 LAB — CULTURE, RESPIRATORY

## 2013-09-21 LAB — PROTIME-INR
INR: 1.52 — ABNORMAL HIGH (ref 0.00–1.49)
Prothrombin Time: 17.9 seconds — ABNORMAL HIGH (ref 11.6–15.2)

## 2013-09-21 MED ORDER — WARFARIN SODIUM 4 MG PO TABS
4.0000 mg | ORAL_TABLET | Freq: Once | ORAL | Status: AC
  Administered 2013-09-21: 4 mg via ORAL
  Filled 2013-09-21: qty 1

## 2013-09-21 MED ORDER — FUROSEMIDE 10 MG/ML IJ SOLN
40.0000 mg | Freq: Once | INTRAMUSCULAR | Status: AC
  Administered 2013-09-21: 40 mg via INTRAVENOUS

## 2013-09-21 MED ORDER — LEVALBUTEROL HCL 0.63 MG/3ML IN NEBU
INHALATION_SOLUTION | RESPIRATORY_TRACT | Status: AC
  Filled 2013-09-21: qty 3

## 2013-09-21 MED ORDER — ALPRAZOLAM 0.25 MG PO TABS
0.2500 mg | ORAL_TABLET | Freq: Every evening | ORAL | Status: DC | PRN
  Administered 2013-09-21 – 2013-09-24 (×2): 0.25 mg via ORAL
  Filled 2013-09-21 (×2): qty 1

## 2013-09-21 MED ORDER — ALPRAZOLAM 0.5 MG PO TABS
0.5000 mg | ORAL_TABLET | Freq: Three times a day (TID) | ORAL | Status: DC | PRN
  Administered 2013-09-21: 0.5 mg via ORAL
  Filled 2013-09-21: qty 1

## 2013-09-21 MED ORDER — POTASSIUM CHLORIDE 10 MEQ/50ML IV SOLN
10.0000 meq | INTRAVENOUS | Status: AC
  Administered 2013-09-21 (×2): 10 meq via INTRAVENOUS
  Filled 2013-09-21 (×2): qty 50

## 2013-09-21 MED ORDER — HYDRALAZINE HCL 20 MG/ML IJ SOLN
10.0000 mg | Freq: Four times a day (QID) | INTRAMUSCULAR | Status: DC | PRN
  Administered 2013-09-21 – 2013-09-24 (×6): 10 mg via INTRAVENOUS
  Filled 2013-09-21 (×5): qty 1

## 2013-09-21 NOTE — Progress Notes (Signed)
CT surgery p.m. Rounds  Maintained sinus rhythm on amiodarone Mean arterial pressure 80-90 Good urine output, no nausea No PI events with polyp speed at 9000 RPMs P.m. labs checked and are satisfactory, creatinine improved, co-ox improved, hemoglobin stable 9. Remains afebrile

## 2013-09-21 NOTE — Progress Notes (Signed)
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 S/P L TKR 2013.   Admitted for VAD work up. Swan placed. IABP placed due to low CO-OX and worsening renal function.   POD #3 LVAD implantation for BTT. LV was friable and soft causing lots of bleeding. Received DDAVP along with multiple blood products. R hemothorax and CT placed night of surgery.   Extubated yesterday. Overnight went into afib and started on Amio and converted. Received 20 mg lasix IV yesterday and weight down 4 lbs. Now off levophed and Epi. Speed turned back up to 9000. Remains on Amio 30 mg/hr and milrinone 0.3. No longer having temps.  CVP 11 Co-ox 50% PA 23/6 (11) Co/CI 4.1/2.2  Cr 1.47>1.83>2.06>2.03 WBC 13.8>10.2>12.2>12.3 Hgb 8.1>7.9>9.1>8.8  LVAD INTERROGATION:  HeartMate II LVAD:  Flow 3.4 liters/min, speed 9000, power 4.4, PI 5.8 (no events overnight).    Objective:    Vital Signs:   Temp:  [96.8 F (36 C)-99.7 F (37.6 C)] 96.8 F (36 C) (01/22 0800) Pulse Rate:  [87-102] 87 (01/22 0800) Resp:  [11-38] 19 (01/22 0800) SpO2:  [78 %-100 %] 97 % (01/22 0818) Arterial Line BP: (87-278)/(35-97) 96/81 mmHg (01/22 0800) Weight:  [76 kg (167 lb 8.8 oz)] 76 kg (167 lb 8.8 oz) (01/22 0500) Last BM Date: 09/17/13 Mean arterial Pressure 80-90s  Intake/Output:   Intake/Output Summary (Last 24 hours) at 09/21/13 0913 Last data filed at 09/21/13 0800  Gross per 24 hour  Intake 2592.94 ml  Output   3325 ml  Net -732.06 ml     Physical Exam: General: Lying in bed;  No resp difficulty,  HEENT: normal Neck: supple. JVP 8; RIJ swan . Carotids 2+ bilat; no bruits. No lymphadenopathy or thryomegaly appreciated. Cor:  Mechanical heart sounds with LVAD hum present. Lungs: diminished  Abdomen: soft, nondistended. No hepatosplenomegaly. No bruits or masses. Hypoactive BS Driveline: C/D/I; securement device intact and driveline secure Extremities: no cyanosis, clubbing, rash, trace LE edema Neuro: alert & orientedx3, cranial nerves grossly intact. moves all 4 extremities w/o difficulty. Affect pleasant  Telemetry: SR 80s  Labs: Basic Metabolic Panel:  Recent Labs Lab 09/18/13 2117 09/18/13 2118 09/19/13 0350 09/19/13 1556 09/20/13 0430 09/20/13 1500 09/21/13 0404  NA 141  --  140 142 141 140 140  K 4.4  --  5.0 4.9 5.0 4.7 4.0  CL 103  --  102 104 103 103 101  CO2  --   --  22 22 20 21 22   GLUCOSE 118*  --  112* 100* 174* 126* 117*  BUN 22  --  25* 32* 39* 47* 50*  CREATININE 1.40* 1.27 1.47* 1.83* 2.06* 2.03* 2.02*  CALCIUM  --   --  8.2* 8.9 8.7 8.6 8.4  MG  --  3.2* 2.7* 2.5 2.7*  --  2.6*  PHOS  --   --  5.1*  --  5.6*  --  5.3*    Liver Function Tests:  Recent Labs Lab 09/19/13 0350 09/20/13 0430 09/21/13 0404  AST 428* 417* 528*  ALT 162* 270* 442*  ALKPHOS 69 82 105  BILITOT 4.3* 5.7* 5.2*  PROT 5.7* 6.0 6.0  ALBUMIN 3.1* 3.0* 2.9*  No results found for this basename: LIPASE, AMYLASE,  in the last 168 hours No results found for this basename: AMMONIA,  in the last 168 hours  CBC:  Recent Labs Lab 09/19/13 0350 09/19/13 1556 09/20/13 0430 09/20/13 1500 09/21/13 0404  WBC 13.8* 10.2 12.2* 11.5* 12.3*  NEUTROABS 12.8*  --  11.5*  --  11.4*  HGB 8.1* 7.9* 9.1* 8.9* 8.8*  HCT 24.1* 23.0* 27.0* 26.0* 26.0*  MCV 82.0 81.6 82.6 83.1 82.8  PLT 158 132* 140* 121* 125*    INR:  Recent Labs Lab 09/18/13 1500 09/19/13 0350 09/20/13 0430 09/21/13 0404  INR 1.47 1.45 1.42 1.52*     Imaging: Dg Chest Port 1 View  09/21/2013   CLINICAL DATA:  Ventricular assist device.  EXAM: PORTABLE CHEST - 1 VIEW  COMPARISON:  One-view chest 09/20/2013.  FINDINGS: The patient  has been extubated. The NG tube was removed. The tip of the Swan-Ganz catheter is stable in position. Pacing wires are stable. Bilateral chest tubes and mediastinal drains are stable. The ventricular assist device remains in place. Mild pulmonary vascular congestion is not significantly changed. Aeration at the bases is slightly improved.  IMPRESSION: 1. Interval extubation and removal of NG tube. 2. The support apparatus is otherwise stable. 3. Slight improved aeration at the lung bases bilaterally with persistent bilateral pulmonary vascular congestion.   Electronically Signed   By: Lawrence Santiago M.D.   On: 09/21/2013 08:14   Dg Chest Port 1 View  09/20/2013   CLINICAL DATA:  Left ventricular assist device placement  EXAM: PORTABLE CHEST - 1 VIEW  COMPARISON:  Portable chest x-ray of 19 September 2013.  FINDINGS: The cardiopericardial silhouette remains mildly enlarged. The pulmonary vascularity is less prominent and the pulmonary interstitial markings have improved though mild interstitial rib edema persists. The hemidiaphragms are partially obscured but still visible. No significant pleural effusion is demonstrated on the right since placement of the right-sided lower chest tube. The tip of this tube lies at the level of the mid portion of the dome of the hemidiaphragm. No pneumothorax is evident.  The endotracheal tube tip lies approximately 6.6 cm above the carina to the carina. The esophagogastric tube tip and proximal port project off the inferior margin of the film. The left-sided upper chest tube tip lies in the pulmonary apex and is unchanged. A second chest tube in the left lower hemi thorax is visible at the margin of the film and is unchanged. No definite mediastinal drain is demonstrated. There is a Swan-Ganz type catheter in place via a right internal jugular Cordis sheath whose tip lies in the region of the main pulmonary artery outflow tract and is unchanged. A second right internal jugular  venous catheter is in place whose tip lies in the region of the proximal SVC. A ermanent pacemaker defibrillator is in place. The left ventricular assist device is partially imaged. The intra-atrial balloon pump marker tip is not visible today.  IMPRESSION: 1. There has been interval improvement in the appearance of the pulmonary interstitium consistent with resolving pulmonary interstitial edema. There is no evidence of a pneumothorax nor large volume pleural effusion. 2. The support tubes and lines and Mechanical devices appear unchanged.   Electronically Signed   By: David  Martinique   On: 09/20/2013 08:05     Medications:     Scheduled Medications: . acetaminophen  1,000 mg Oral Q6H   Or  . acetaminophen (TYLENOL) oral liquid 160 mg/5 mL  1,000 mg  Per Tube Q6H  . antiseptic oral rinse  15 mL Mouth Rinse QID  . aspirin EC  325 mg Oral Daily   Or  . aspirin  324 mg Per Tube Daily   Or  . aspirin  300 mg Rectal Daily  . bisacodyl  10 mg Oral Daily   Or  . bisacodyl  10 mg Rectal Daily  . budesonide-formoterol  2 puff Inhalation BID  . chlorhexidine  15 mL Mouth/Throat BID  . docusate sodium  200 mg Oral Daily  . ezetimibe  10 mg Oral Daily  . insulin aspart  0-24 Units Subcutaneous Q4H  . levalbuterol      . levalbuterol  1.25 mg Nebulization Q6H  . levothyroxine  150 mcg Oral Custom   And  . levothyroxine  75 mcg Oral Custom  . metoCLOPramide (REGLAN) injection  10 mg Intravenous Q6H  . pantoprazole (PROTONIX) IV  40 mg Intravenous Q24H  . piperacillin-tazobactam (ZOSYN)  IV  3.375 g Intravenous Q8H  . sodium chloride  3 mL Intravenous Q12H  . vancomycin  1,250 mg Intravenous Q24H  . warfarin  4 mg Oral ONCE-1800  . Warfarin - Physician Dosing Inpatient   Does not apply q1800    Infusions: . sodium chloride 20 mL/hr at 09/20/13 0900  . sodium chloride 10 mL/hr at 09/18/13 2000  . sodium chloride    . amiodarone (NEXTERONE PREMIX) 360 mg/200 mL dextrose 30 mg/hr (09/21/13  0800)  . dexmedetomidine Stopped (09/20/13 0700)  . DOPamine 2 mcg/kg/min (09/21/13 0800)  . EPINEPHrine infusion Stopped (09/21/13 0500)  . lactated ringers 20 mL/hr at 09/20/13 0900  . milrinone 0.3 mcg/kg/min (09/21/13 0800)  . norepinephrine (LEVOPHED) Adult infusion Stopped (09/20/13 1200)    PRN Medications: sodium chloride, ALPRAZolam, hydrALAZINE, menthol-cetylpyridinium, midazolam, morphine injection, ondansetron (ZOFRAN) IV, oxyCODONE, potassium chloride, sodium chloride, zolpidem   Assessment:   1. 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 Acute on chronic renal failure, stage III  4. Acute blood loss anemia, expected - total 4 PRBCs, 1 FFP 5. AKI 6. Persistent fever   Plan/Discussion:    Overall progressing well however co-ox remains low and renal function slightly worse. Overnight went into afib and started on Amio gtt, which he converted back SR. Will continue amio gtt. Co-ox this am 50% on milrinone 0.3 and dopamine 2 mcg. CVP now 11 and received 40 mg IV lasix this am. Will need to watch PI and flows closely, may need to turn speed back down to 8800. Will recheck co-ox a bit later today and if still low will consider changing milrinone to dobutamine. His volume status is nearing baseline so need to be very careful with diuresis. Belly still in process of waking up.   Will place PICC today and D/C swan. OOB to chair and ambulate as tolerated.   CT drainage slowing down and Hgb stable.   INR 1.5, pharmacy to dose. Continue SCDs.  Patient very anxious and not sleeping much. Will start PRN xanax 0.5 mg PRN.   I reviewed the LVAD parameters from today, and compared the results to the patient's prior recorded data.  No programming changes were made.  The LVAD is functioning within specified parameters.  The patient performs LVAD self-test daily.  LVAD interrogation was negative for any significant power changes, alarms or PI  events/speed drops.  LVAD equipment check completed and is in good working order.  Back-up equipment present.  LVAD education done on emergency procedures and precautions and reviewed exit site care.  Length of Stay: 8  Glori Bickers MD  09/21/2013, 9:13 AM Advanced Heart Failure Team Pager [phone] (M-F; [place])  Please contact [facility] for night-coverage after hours (4p -7a ) and weekends on [email or web address]  VAD Team Pager [phone] (7am - 7am) For VAD issues only.

## 2013-09-21 NOTE — Progress Notes (Signed)
Occupational Therapy Treatment Patient Details Name: Johnny Oneill MRN: [id number] DOB: Feb 07, 1941 Today's Date: 09/21/2013 Time: 1100-1150 OT Time Calculation (min): 50 min  OT Assessment / Plan / Recommendation  History of present illness Johnny Oneill is a 73 year old male with history of CAD s/p stent to LCX 2004 with re-occlusion of LCX with lateral MI 2009 and repeat stenting, total occlusion of RCA, 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 S/P L TKR 2013. Last Cath 2009..  [name]   OT comments  Pt progressing well.  Able to tolerate EOB sitting and transferred to recliner.  Pt requires mod A +2 for stand pivot transfer.  PI starting was 7.8 and 5.8 at end of session, but rebounded quickly.   Pt initially with complaint of dizziness that resolved quickly   Follow Up Recommendations  No OT follow up;Supervision/Assistance - 24 hour    Barriers to Discharge       Equipment Recommendations  Other (comment)    Recommendations for Other Services    Frequency Min 3X/week   Progress towards OT Goals Progress towards OT goals: Progressing toward goals  Plan Discharge plan remains appropriate    Precautions / Restrictions Precautions Precautions: Sternal;Other (comment) (LVAD) Precaution Comments: Multiple lines; LVAD equipment   Pertinent Vitals/Pain     ADL  Toilet Transfer: Moderate assistance (+2) Toilet Transfer Method: Sit to stand;Stand pivot Transfers/Ambulation Related to ADLs: +2 mod A to recliner ADL Comments: Sat EOB x 15 mins.  with min a to mod A.  Pt instructed about sternal precautions and about controller and safety with controller and drive line.      OT Diagnosis:    OT Problem List:   OT Treatment Interventions:     OT Goals(current goals can now be found in the care plan section) Acute Rehab OT Goals Patient Stated Goal: to get better OT Goal  Formulation: With patient Time For Goal Achievement: 10/04/13 Potential to Achieve Goals: Good ADL Goals Pt Will Perform Grooming: with min guard assist;standing Pt Will Perform Upper Body Bathing: with supervision;sitting Pt Will Perform Lower Body Bathing: with min assist;sit to/from stand Pt Will Perform Upper Body Dressing: with supervision;sitting Pt Will Perform Lower Body Dressing: with min assist;sit to/from stand Pt Will Transfer to Toilet: with min guard assist;ambulating;bedside commode Pt Will Perform Toileting - Clothing Manipulation and hygiene: with min guard assist;sit to/from stand Additional ADL Goal #1: Pt will be independent with sternal precautions during all BADLs and functional mobility Additional ADL Goal #2: Pt will be independent with managing LVAD equipment   Visit Information  Last OT Received On: 09/21/13 Assistance Needed: +2 PT/OT/SLP Co-Evaluation/Treatment: Yes Reason for Co-Treatment: Complexity of the patient's impairments (multi-system involvement);For patient/therapist safety OT goals addressed during session: Other (comment) History of Present Illness: Johnny Oneill is a 73 year old male with history of CAD s/p stent to LCX 2004 with re-occlusion of LCX with lateral MI 2009 and repeat stenting, total occlusion of RCA, 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 S/P L TKR 2013. Last Cath 2009..  [name]    Subjective Data      Prior Functioning       Cognition  Cognition Arousal/Alertness: Lethargic Behavior During Therapy: Flat affect Overall Cognitive Status: Within Functional Limits for tasks assessed    Mobility  Bed Mobility  Overal bed mobility: Needs Assistance Bed Mobility: Supine to Sit;Sit to Supine Supine to sit: Max assist;+2 for physical assistance General bed mobility comments: with cues, pt scooted LEs toward EOB.  Assist to  lower LEs off bed and assist to lift trunk Transfers Overall transfer level: Needs assistance Transfers: Stand Pivot Transfers;Sit to/from Stand Sit to Stand: Mod assist;+2 physical assistance Stand pivot transfers: Mod assist;+2 physical assistance General transfer comment: Pt assist to move into standing, and to pivot toward chair.  Pt began to move into flexed posture in prep to sit prematurely.     Exercises      Balance Balance Overall balance assessment: Needs assistance Sitting-balance support: No upper extremity supported;Feet supported Sitting balance-Leahy Scale: Poor Sitting balance - Comments: Sat EOB with min A - mod A.  Pt noted to lean forward frequently.  When asked if he is fatigued, he stated "no", but did not initiate sitting erect without verbal and physical cues.  While sitting in chair, pt noted to lean to left Standing balance support: No upper extremity supported Standing balance-Leahy Scale: Poor  End of Session OT - End of Session Equipment Utilized During Treatment: Oxygen;Other (comment) (LVAD) Activity Tolerance: Patient tolerated treatment well Patient left: with call bell/phone within reach;in chair Nurse Communication: Mobility status  Johnny Oneill 09/21/2013, 12:20 PM

## 2013-09-21 NOTE — Progress Notes (Signed)
When getting patient back to bed, he was leaning a lot more to the left. He could straighten his body out but would then go back to leaning to the left. Grips a little weak but equal. Unable to lift left arm very high due to shoulder surgery.Smile symmetrical. Tongue midline. Lower extremities equal. He has been very sleepy today.  Anxious earlier and was given Xanax. Also he hadn't slept at all last night and has slept a good portion of the day but does awaken easily and participates with therapies. Johnny Bame, NP, notified.

## 2013-09-21 NOTE — Progress Notes (Addendum)
Evening Rounds:  Patient doing well throughout the day. Co-ox improved to 60%. He remains on milrinone 0.3, dopamine 0.2 and amiodarone. He got up with PT and there was concern that he was favoring left side and leaning more that way. He was also complaining of difficulty lifting his L arm up. Patient received xanax 0.5 mg today and when I stopped by to check on him was pretty lethargic. He had equal bilateral grips, no facial droop and could stick his tongue out. Cut xanax back to 0.25 mg QHS PRN. Creatinine improving now 1.84 and Hgb stable, 9.0. Awaiting PICC. Making good progress.  Junie Bame B NP-C 5:06 PM

## 2013-09-21 NOTE — Progress Notes (Addendum)
Physical Therapy Treatment Patient Details Name: Johnny Oneill MRN: [id number] DOB: March 05, 1941 Today's Date: 09/21/2013 Time: 1100-1150 PT Time Calculation (min): 50 min  PT Assessment / Plan / Recommendation  History of Present Illness Johnny Oneill is a 73 year old male with history of CAD s/p stent to LCX 2004 with re-occlusion of LCX with lateral MI 2009 and repeat stenting, total occlusion of RCA, 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 S/P L TKR 2013. Last Cath 2009..  [name]   PT Comments   PI maintain well throughout treatment in upper 6 to lower 7 range.  Pt reported not being tired, but suggested otherwise by leaning forward moderately heavily in sitting and slumping/ listing while in the chair necessitating positioning.  He did mobilize well with 2 person assist.  Marched in place for approx 15 steps with little change in PI before pivoting to chair for a time up.   Follow Up Recommendations  Home health PT;Supervision/Assistance - 24 hour;Supervision for mobility/OOB     Does the patient have the potential to tolerate intense rehabilitation     Barriers to Discharge        Equipment Recommendations  Other (comment) (TBA during course of stay)    Recommendations for Other Services    Frequency Min 5X/week   Progress towards PT Goals Progress towards PT goals: Progressing toward goals  Plan Current plan remains appropriate    Precautions / Restrictions Precautions Precautions: Sternal;Other (comment) (LVAD) Precaution Comments: Multiple lines; LVAD equipment   Pertinent Vitals/Pain See above     Mobility  Bed Mobility Overal bed mobility: Needs Assistance Bed Mobility: Supine to Sit;Sit to Supine Supine to sit: Max assist;+2 for physical assistance General bed mobility comments: with cues, pt scooted LEs toward EOB.  Assist to lower LEs off bed and assist to  lift trunk Transfers Overall transfer level: Needs assistance Transfers: Sit to/from Stand;Stand Pivot Transfers Sit to Stand: Mod assist;+2 physical assistance Stand pivot transfers: Mod assist;+2 physical assistance General transfer comment: Pt assist to move into standing, and to pivot toward chair.  Pt began to move into flexed posture in prep to sit prematurely.     Exercises General Exercises - Lower Extremity Long Arc Quad: AROM;Both;5 reps;Seated Heel Slides: AAROM;Both;10 reps;Supine   PT Diagnosis:    PT Problem List:   PT Treatment Interventions:     PT Goals (current goals can now be found in the care plan section) Acute Rehab PT Goals Patient Stated Goal: to get better PT Goal Formulation: With patient Time For Goal Achievement: 10/04/13 Potential to Achieve Goals: Good  Visit Information  Last PT Received On: 09/21/13 Assistance Needed: +2 PT/OT/SLP Co-Evaluation/Treatment: Yes Reason for Co-Treatment: Complexity of the patient's impairments (multi-system involvement) PT goals addressed during session: Mobility/safety with mobility OT goals addressed during session: Other (comment) History of Present Illness: Johnny Oneill is a 73 year old male with history of CAD s/p stent to LCX 2004 with re-occlusion of LCX with lateral MI 2009 and repeat stenting, total occlusion of RCA, 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 S/P L TKR 2013. Last Cath 2009..  [name]    Subjective Data  Subjective: I'm fine.Marland KitchenMarland KitchenMarland KitchenNo I'm not tired.... Patient Stated Goal: to get better   Cognition  Cognition Arousal/Alertness: Lethargic Behavior During Therapy: Flat affect Overall Cognitive Status:  Within Functional Limits for tasks assessed    Balance  Balance Overall balance assessment: Needs assistance Sitting-balance support: No upper extremity supported;Feet supported Sitting  balance-Leahy Scale: Poor Sitting balance - Comments: Sat EOB with min A - mod A.  Pt noted to lean forward frequently.  When asked if he is fatigued, he stated "no", but did not initiate sitting erect without verbal and physical cues.  While sitting in chair, pt noted to lean to left Standing balance support: No upper extremity supported Standing balance-Leahy Scale: Poor  End of Session PT - End of Session Equipment Utilized During Treatment: Oxygen Activity Tolerance: Patient tolerated treatment well;Patient limited by fatigue Patient left: with call bell/phone within reach;in chair Nurse Communication: Mobility status   GP     Harith Mccadden, Tessie Fass 09/21/2013, 1:03 PM 09/21/2013  Donnella Sham, [place] [phone]  (pager)

## 2013-09-21 NOTE — Progress Notes (Signed)
HeartMate 2 Rounding Note  Subjective:   Mr.Zeoli is a 73 year old patient with ischemic cardiomyopathy with advanced heart failure on the transplant waiting list at [facility] and supported with home milrinone since April 2014. On this admission he was admitted with acute on chronic heart failure was found to be in cardiogenic shock which responded only to increased dose of milrinone and placement of intra-aortic balloon pump. The patient was discussed at the [facility] conference and felt to be an appropriate candidate for LVAD implantation as a bridge to transplantation. Prior to surgery the details of LVAD implantation were thoroughly discussed with patient and his wife including the expected postop recovery in the long-term postoperative care and followup.  Postop day 3  LVAD implantation Intraoperative and postoperative course complicated by friable LV myocardium at the apical cannulation site resulting in bleeding from suture tears in the myocardium repaired by multiple extra pledgeted sutures placed before separating from cardio coronary bypass.  Patient had stable night, good LVAD flows without PI events Atrial fibrillation converted to sinus rhythm with IV amiodarone Chest x-ray shows improved edema in chest tube drainage is minimal  Patient's postoperative fever broke early in the morning and as a result his PI is improved and mean arterial pressure is higher with some pulsatility  Remains on low-dose dopamine and milrinone with cardiac output 4 L and CVP 1 10-15  Plan on removing PA catheter and posterior mediastinal drainage tube today and getting out of bed to chair with PT  Patient currently hemodynamically stable on low-dose inotropes the remains intubated. Nitric oxide will be slowly weaned today. His chest x-ray appears wet and his FiO2 is in the process of being weaned. Chest tube drainage is now minimal.  LVAD INTERROGATION:  HeartMate II LVAD:  Flow 3.8 liters/min,  speed 9000 RPM power 4.4 PI 4.4  .   Objective:    Vital Signs:   Temp:  [96.8 F (36 C)-97.5 F (36.4 C)] 97.4 F (36.3 C) (01/22 1633) Pulse Rate:  [87-100] 95 (01/22 1700) Resp:  [12-38] 22 (01/22 1700) SpO2:  [78 %-100 %] 100 % (01/22 1700) Arterial Line BP: (78-119)/(69-97) 95/75 mmHg (01/22 1700) Weight:  [167 lb 8.8 oz (76 kg)] 167 lb 8.8 oz (76 kg) (01/22 0500) Last BM Date: 09/17/13 Mean arterial Pressure 80-90 mmHg  Intake/Output:   Intake/Output Summary (Last 24 hours) at 09/21/13 1814 Last data filed at 09/21/13 1700  Gross per 24 hour  Intake   1995 ml  Output   3380 ml  Net  -1385 ml     Physical Exam: General: Extubated and responsive, good color HEENT: normal, minimal edema Neck: supple. JVP .  no bruits. No lymphadenopathy or thryomegaly appreciated. Cor: Mechanical heart sounds with LVAD hum present. Lungs: Scattered rhonchi, chest tubes with minimal sanguinous drainage Abdomen: soft, nontender, nondistended. No hepatosplenomegaly. No bruits or masses. Good bowel diminished Driveline: C/D/I; securement device intact and driveline incorporated Extremities: no cyanosis, clubbing, rash, edema, periphery warm and well perfused Neuro: Nontender question, moves extremities  Telemetry: Sinus rhythm 80-90 per minute  Labs: Basic Metabolic Panel:  Recent Labs Lab 09/18/13 2117 09/18/13 2118 09/19/13 0350 09/19/13 1556 09/20/13 0430 09/20/13 1500 09/21/13 0404 09/21/13 1600  NA 141  --  140 142 141 140 140 139  K 4.4  --  5.0 4.9 5.0 4.7 4.0 3.8  CL 103  --  102 104 103 103 101 100  CO2  --   --  22 22 20  21  22 23  GLUCOSE 118*  --  112* 100* 174* 126* 117* 125*  BUN 22  --  25* 32* 39* 47* 50* 52*  CREATININE 1.40* 1.27 1.47* 1.83* 2.06* 2.03* 2.02* 1.84*  CALCIUM  --   --  8.2* 8.9 8.7 8.6 8.4 8.6  MG  --  3.2* 2.7* 2.5 2.7*  --  2.6*  --   PHOS  --   --  5.1*  --  5.6*  --  5.3*  --     Liver Function Tests:  Recent Labs Lab  09/19/13 0350 09/20/13 0430 09/21/13 0404  AST 428* 417* 528*  ALT 162* 270* 442*  ALKPHOS 69 82 105  BILITOT 4.3* 5.7* 5.2*  PROT 5.7* 6.0 6.0  ALBUMIN 3.1* 3.0* 2.9*   No results found for this basename: LIPASE, AMYLASE,  in the last 168 hours No results found for this basename: AMMONIA,  in the last 168 hours  CBC:  Recent Labs Lab 09/19/13 0350 09/19/13 1556 09/20/13 0430 09/20/13 1500 09/21/13 0404 09/21/13 1600  WBC 13.8* 10.2 12.2* 11.5* 12.3* 12.9*  NEUTROABS 12.8*  --  11.5*  --  11.4*  --   HGB 8.1* 7.9* 9.1* 8.9* 8.8* 9.0*  HCT 24.1* 23.0* 27.0* 26.0* 26.0* 26.3*  MCV 82.0 81.6 82.6 83.1 82.8 81.9  PLT 158 132* 140* 121* 125* 132*    INR:  Recent Labs Lab 09/18/13 1500 09/19/13 0350 09/20/13 0430 09/21/13 0404  INR 1.47 1.45 1.42 1.52*    Other results:  EKG:   Imaging: Dg Chest Port 1 View  09/21/2013   CLINICAL DATA:  Ventricular assist device.  EXAM: PORTABLE CHEST - 1 VIEW  COMPARISON:  One-view chest 09/20/2013.  FINDINGS: The patient has been extubated. The NG tube was removed. The tip of the Swan-Ganz catheter is stable in position. Pacing wires are stable. Bilateral chest tubes and mediastinal drains are stable. The ventricular assist device remains in place. Mild pulmonary vascular congestion is not significantly changed. Aeration at the bases is slightly improved.  IMPRESSION: 1. Interval extubation and removal of NG tube. 2. The support apparatus is otherwise stable. 3. Slight improved aeration at the lung bases bilaterally with persistent bilateral pulmonary vascular congestion.   Electronically Signed   By: Lawrence Santiago M.D.   On: 09/21/2013 08:14   Dg Chest Port 1 View  09/20/2013   CLINICAL DATA:  Left ventricular assist device placement  EXAM: PORTABLE CHEST - 1 VIEW  COMPARISON:  Portable chest x-ray of 19 September 2013.  FINDINGS: The cardiopericardial silhouette remains mildly enlarged. The pulmonary vascularity is less prominent and  the pulmonary interstitial markings have improved though mild interstitial rib edema persists. The hemidiaphragms are partially obscured but still visible. No significant pleural effusion is demonstrated on the right since placement of the right-sided lower chest tube. The tip of this tube lies at the level of the mid portion of the dome of the hemidiaphragm. No pneumothorax is evident.  The endotracheal tube tip lies approximately 6.6 cm above the carina to the carina. The esophagogastric tube tip and proximal port project off the inferior margin of the film. The left-sided upper chest tube tip lies in the pulmonary apex and is unchanged. A second chest tube in the left lower hemi thorax is visible at the margin of the film and is unchanged. No definite mediastinal drain is demonstrated. There is a Swan-Ganz type catheter in place via a right internal jugular Cordis sheath whose tip lies in  the region of the main pulmonary artery outflow tract and is unchanged. A second right internal jugular venous catheter is in place whose tip lies in the region of the proximal SVC. A ermanent pacemaker defibrillator is in place. The left ventricular assist device is partially imaged. The intra-atrial balloon pump marker tip is not visible today.  IMPRESSION: 1. There has been interval improvement in the appearance of the pulmonary interstitium consistent with resolving pulmonary interstitial edema. There is no evidence of a pneumothorax nor large volume pleural effusion. 2. The support tubes and lines and Mechanical devices appear unchanged.   Electronically Signed   By: David  Martinique   On: 09/20/2013 08:05     Medications:     Scheduled Medications: . acetaminophen  1,000 mg Oral Q6H   Or  . acetaminophen (TYLENOL) oral liquid 160 mg/5 mL  1,000 mg Per Tube Q6H  . antiseptic oral rinse  15 mL Mouth Rinse QID  . aspirin EC  325 mg Oral Daily   Or  . aspirin  324 mg Per Tube Daily   Or  . aspirin  300 mg Rectal  Daily  . bisacodyl  10 mg Oral Daily   Or  . bisacodyl  10 mg Rectal Daily  . budesonide-formoterol  2 puff Inhalation BID  . chlorhexidine  15 mL Mouth/Throat BID  . docusate sodium  200 mg Oral Daily  . ezetimibe  10 mg Oral Daily  . insulin aspart  0-24 Units Subcutaneous Q4H  . levalbuterol      . levalbuterol  1.25 mg Nebulization Q6H  . levothyroxine  150 mcg Oral Custom   And  . levothyroxine  75 mcg Oral Custom  . metoCLOPramide (REGLAN) injection  10 mg Intravenous Q6H  . pantoprazole (PROTONIX) IV  40 mg Intravenous Q24H  . piperacillin-tazobactam (ZOSYN)  IV  3.375 g Intravenous Q8H  . potassium chloride  10 mEq Intravenous Q1 Hr x 2  . sodium chloride  3 mL Intravenous Q12H  . vancomycin  1,250 mg Intravenous Q24H  . Warfarin - Physician Dosing Inpatient   Does not apply q1800    Infusions: . sodium chloride 20 mL/hr at 09/20/13 0900  . sodium chloride 10 mL/hr at 09/18/13 2000  . sodium chloride    . amiodarone (NEXTERONE PREMIX) 360 mg/200 mL dextrose 30 mg/hr (09/21/13 1756)  . dexmedetomidine Stopped (09/20/13 0700)  . DOPamine 2 mcg/kg/min (09/21/13 1700)  . EPINEPHrine infusion Stopped (09/21/13 0500)  . lactated ringers 20 mL/hr at 09/20/13 0900  . milrinone 0.3 mcg/kg/min (09/21/13 1700)  . norepinephrine (LEVOPHED) Adult infusion Stopped (09/20/13 1200)    PRN Medications: sodium chloride, ALPRAZolam, hydrALAZINE, menthol-cetylpyridinium, midazolam, morphine injection, ondansetron (ZOFRAN) IV, oxyCODONE, potassium chloride, sodium chloride, zolpidem   Assessment:  1 acute on chronic systolic heart failure EF 20% requiring implantable LVAD January 19,015 2 ischemic cardiomyopathy 3 perioperative coagulopathy now improved 4 ventilator dependent respiratory insufficiency due to pulmonary edema, possible ARDS from cardio pulmonary bypass-now resolved and patient is extubated   Plan/Discussion:   Patient is doing well, extubated neuro intact Overall  weak but did get out of bed with physical therapy assistance and sat in chair Continued problems with renal insufficiency-elevated creatinine and increased weight, pulmonary insufficiency-requiring 6 L nasal cannula with interstitial edema on x-ray, and overall weakness and deconditioning  We'll continue with physical therapy Will wean off nasal cannula nitric oxide PICC line will be placed for milrinone to support RV Continue renal dose dopamine until creatinine  normalizes  Coumadin started second dose this evening for milligrams by mouth      I reviewed the LVAD parameters from today, and compared the results to the patient's prior recorded data.  No programming changes were made.  The LVAD is functioning within specified parameters.  The patient performs LVAD self-test daily.  LVAD interrogation was negative for any significant power changes, alarms or PI events/speed drops.  LVAD equipment check completed and is in good working order.  Back-up equipment present.   LVAD education done on emergency procedures and precautions and reviewed exit site care.  Length of Stay: 8  VAN TRIGT III,Deontrae Drinkard 09/21/2013, 6:14 PM  VAD Team Pager [phone] (7am - 7am)

## 2013-09-22 ENCOUNTER — Inpatient Hospital Stay (HOSPITAL_COMMUNITY): Payer: Medicare Other

## 2013-09-22 DIAGNOSIS — I5023 Acute on chronic systolic (congestive) heart failure: Secondary | ICD-10-CM

## 2013-09-22 LAB — CBC WITH DIFFERENTIAL/PLATELET
Basophils Absolute: 0 10*3/uL (ref 0.0–0.1)
Basophils Relative: 0 % (ref 0–1)
Eosinophils Absolute: 0.1 10*3/uL (ref 0.0–0.7)
Eosinophils Relative: 1 % (ref 0–5)
HCT: 26 % — ABNORMAL LOW (ref 39.0–52.0)
Hemoglobin: 8.7 g/dL — ABNORMAL LOW (ref 13.0–17.0)
Lymphocytes Relative: 3 % — ABNORMAL LOW (ref 12–46)
Lymphs Abs: 0.4 10*3/uL — ABNORMAL LOW (ref 0.7–4.0)
MCH: 27.6 pg (ref 26.0–34.0)
MCHC: 33.5 g/dL (ref 30.0–36.0)
MCV: 82.5 fL (ref 78.0–100.0)
Monocytes Absolute: 0.6 10*3/uL (ref 0.1–1.0)
Monocytes Relative: 5 % (ref 3–12)
Neutro Abs: 9.4 10*3/uL — ABNORMAL HIGH (ref 1.7–7.7)
Neutrophils Relative %: 90 % — ABNORMAL HIGH (ref 43–77)
Platelets: 141 10*3/uL — ABNORMAL LOW (ref 150–400)
RBC: 3.15 MIL/uL — ABNORMAL LOW (ref 4.22–5.81)
RDW: 17.6 % — ABNORMAL HIGH (ref 11.5–15.5)
WBC: 10.4 10*3/uL (ref 4.0–10.5)

## 2013-09-22 LAB — BLOOD GAS, ARTERIAL
Acid-Base Excess: 1.6 mmol/L (ref 0.0–2.0)
Bicarbonate: 25.8 mEq/L — ABNORMAL HIGH (ref 20.0–24.0)
Drawn by: 252031
Nitric Oxide: 3
O2 Content: 3 L/min
O2 Saturation: 95.4 %
Patient temperature: 98.6
TCO2: 27.1 mmol/L (ref 0–100)
pCO2 arterial: 42.4 mmHg (ref 35.0–45.0)
pH, Arterial: 7.402 (ref 7.350–7.450)
pO2, Arterial: 81 mmHg (ref 80.0–100.0)

## 2013-09-22 LAB — GLUCOSE, CAPILLARY
Glucose-Capillary: 103 mg/dL — ABNORMAL HIGH (ref 70–99)
Glucose-Capillary: 106 mg/dL — ABNORMAL HIGH (ref 70–99)
Glucose-Capillary: 112 mg/dL — ABNORMAL HIGH (ref 70–99)
Glucose-Capillary: 130 mg/dL — ABNORMAL HIGH (ref 70–99)
Glucose-Capillary: 136 mg/dL — ABNORMAL HIGH (ref 70–99)
Glucose-Capillary: 93 mg/dL (ref 70–99)

## 2013-09-22 LAB — BASIC METABOLIC PANEL
BUN: 48 mg/dL — ABNORMAL HIGH (ref 6–23)
CO2: 23 mEq/L (ref 19–32)
Calcium: 8.1 mg/dL — ABNORMAL LOW (ref 8.4–10.5)
Chloride: 104 mEq/L (ref 96–112)
Creatinine, Ser: 1.56 mg/dL — ABNORMAL HIGH (ref 0.50–1.35)
GFR calc Af Amer: 50 mL/min — ABNORMAL LOW (ref >90)
GFR calc non Af Amer: 43 mL/min — ABNORMAL LOW (ref >90)
Glucose, Bld: 127 mg/dL — ABNORMAL HIGH (ref 70–99)
Potassium: 3.9 mEq/L (ref 3.7–5.3)
Sodium: 143 mEq/L (ref 137–147)

## 2013-09-22 LAB — COMPREHENSIVE METABOLIC PANEL
ALT: 373 U/L — ABNORMAL HIGH (ref 0–53)
AST: 266 U/L — ABNORMAL HIGH (ref 0–37)
Albumin: 2.6 g/dL — ABNORMAL LOW (ref 3.5–5.2)
Alkaline Phosphatase: 117 U/L (ref 39–117)
BUN: 49 mg/dL — ABNORMAL HIGH (ref 6–23)
CO2: 24 mEq/L (ref 19–32)
Calcium: 8.2 mg/dL — ABNORMAL LOW (ref 8.4–10.5)
Chloride: 103 mEq/L (ref 96–112)
Creatinine, Ser: 1.72 mg/dL — ABNORMAL HIGH (ref 0.50–1.35)
GFR calc Af Amer: 45 mL/min — ABNORMAL LOW (ref >90)
GFR calc non Af Amer: 39 mL/min — ABNORMAL LOW (ref >90)
Glucose, Bld: 110 mg/dL — ABNORMAL HIGH (ref 70–99)
Potassium: 3.7 mEq/L (ref 3.7–5.3)
Sodium: 141 mEq/L (ref 137–147)
Total Bilirubin: 3.2 mg/dL — ABNORMAL HIGH (ref 0.3–1.2)
Total Protein: 5.9 g/dL — ABNORMAL LOW (ref 6.0–8.3)

## 2013-09-22 LAB — PHOSPHORUS: Phosphorus: 3.7 mg/dL (ref 2.3–4.6)

## 2013-09-22 LAB — MAGNESIUM: Magnesium: 2.5 mg/dL (ref 1.5–2.5)

## 2013-09-22 LAB — PROTIME-INR
INR: 1.38 (ref 0.00–1.49)
Prothrombin Time: 16.6 seconds — ABNORMAL HIGH (ref 11.6–15.2)

## 2013-09-22 LAB — LACTATE DEHYDROGENASE: LDH: 551 U/L — ABNORMAL HIGH (ref 94–250)

## 2013-09-22 LAB — CARBOXYHEMOGLOBIN
Carboxyhemoglobin: 1.8 % — ABNORMAL HIGH (ref 0.5–1.5)
Carboxyhemoglobin: 1.5 % (ref 0.5–1.5)
Methemoglobin: 1.5 % (ref 0.0–1.5)
Methemoglobin: 1.1 % (ref 0.0–1.5)
O2 Saturation: 66.2 %
O2 Saturation: 55.2 %
Total hemoglobin: 10.8 g/dL — ABNORMAL LOW (ref 13.5–18.0)
Total hemoglobin: 9.1 g/dL — ABNORMAL LOW (ref 13.5–18.0)

## 2013-09-22 LAB — PREALBUMIN: Prealbumin: 8 mg/dL — ABNORMAL LOW (ref 17.0–34.0)

## 2013-09-22 MED ORDER — MIDAZOLAM HCL 2 MG/2ML IJ SOLN
1.0000 mg | Freq: Four times a day (QID) | INTRAMUSCULAR | Status: DC | PRN
Start: 2013-09-22 — End: 2013-09-23

## 2013-09-22 MED ORDER — WARFARIN SODIUM 6 MG PO TABS
6.0000 mg | ORAL_TABLET | Freq: Once | ORAL | Status: DC
  Filled 2013-09-22: qty 1

## 2013-09-22 MED ORDER — POTASSIUM CHLORIDE 10 MEQ/50ML IV SOLN
10.0000 meq | INTRAVENOUS | Status: AC
  Administered 2013-09-22 (×3): 10 meq via INTRAVENOUS

## 2013-09-22 MED ORDER — POTASSIUM CHLORIDE 10 MEQ/50ML IV SOLN
10.0000 meq | INTRAVENOUS | Status: AC
  Administered 2013-09-22 (×2): 10 meq via INTRAVENOUS
  Filled 2013-09-22: qty 50

## 2013-09-22 MED ORDER — CITALOPRAM HYDROBROMIDE 20 MG PO TABS
20.0000 mg | ORAL_TABLET | Freq: Every day | ORAL | Status: DC
  Administered 2013-09-22 – 2013-09-24 (×3): 20 mg via ORAL
  Filled 2013-09-22 (×4): qty 1

## 2013-09-22 MED ORDER — FE FUMARATE-B12-VIT C-FA-IFC PO CAPS
1.0000 | ORAL_CAPSULE | Freq: Every morning | ORAL | Status: DC
  Administered 2013-09-23 – 2013-10-03 (×11): 1 via ORAL
  Filled 2013-09-22 (×11): qty 1

## 2013-09-22 MED ORDER — SODIUM CHLORIDE 0.9 % IJ SOLN
10.0000 mL | INTRAMUSCULAR | Status: DC | PRN
  Administered 2013-09-22: 20 mL
  Administered 2013-09-28 – 2013-09-30 (×5): 10 mL

## 2013-09-22 MED ORDER — SODIUM CHLORIDE 0.9 % IJ SOLN
10.0000 mL | Freq: Two times a day (BID) | INTRAMUSCULAR | Status: DC
  Administered 2013-09-22 – 2013-09-29 (×7): 10 mL

## 2013-09-22 MED ORDER — MILRINONE IN DEXTROSE 20 MG/100ML IV SOLN
0.1250 ug/kg/min | INTRAVENOUS | Status: DC
  Administered 2013-09-22: 0.2 ug/kg/min via INTRAVENOUS
  Administered 2013-09-23 – 2013-09-24 (×2): 0.125 ug/kg/min via INTRAVENOUS
  Filled 2013-09-22 (×3): qty 100

## 2013-09-22 MED ORDER — FUROSEMIDE 10 MG/ML IJ SOLN
40.0000 mg | Freq: Once | INTRAMUSCULAR | Status: AC
  Administered 2013-09-22: 40 mg via INTRAVENOUS

## 2013-09-22 MED ORDER — AMIODARONE HCL 200 MG PO TABS
400.0000 mg | ORAL_TABLET | Freq: Every day | ORAL | Status: DC
  Administered 2013-09-22 – 2013-09-25 (×4): 400 mg via ORAL
  Filled 2013-09-22 (×4): qty 2

## 2013-09-22 NOTE — Progress Notes (Signed)
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 S/P L TKR 2013.   Admitted for VAD work up. Swan placed. IABP placed due to low CO-OX and worsening renal function.   POD #4 LVAD implantation for BTT. LV was friable and soft causing lots of bleeding. Received DDAVP along with multiple blood products. R hemothorax and CT placed night of surgery.   Doing well. Johnny Oneill out and PICC pending. Remains in SR 80s. Weight down 5 lbs and 24 hr I/O -1.3 liters. Remains on Amio gtt, milrinone 0.3 and dopamine 2 mcg. OOB to chair yesterday and walked minimal distance. Passing gas.   CVP 13 Co-ox 66%   Cr 1.47>1.83>2.06>2.03>1.72 WBC 13.8>10.2>12.2>12.3>10.4 Hgb 8.1>7.9>9.1>8.8>8.7  LVAD INTERROGATION:  HeartMate II LVAD:  Flow 4.1 liters/min, speed 9000, power 4.7, PI 8.2 (no events overnight).    Objective:    Vital Signs:   Temp:  [96.8 F (36 C)-97.7 F (36.5 C)] 97.5 F (36.4 C) (01/23 0333) Pulse Rate:  [25-96] 93 (01/23 0500) Resp:  [12-25] 19 (01/23 0500) SpO2:  [91 %-100 %] 96 % (01/23 0500) Arterial Line BP: (78-114)/(69-91) 97/76 mmHg (01/23 0500) Weight:  [162 lb 4.1 oz (73.6 kg)] 162 lb 4.1 oz (73.6 kg) (01/23 0500) Last BM Date: 09/17/13 Mean arterial Pressure 80-90s  Intake/Output:   Intake/Output Summary (Last 24 hours) at 09/22/13 0657 Last data filed at 09/22/13 0500  Gross per 24 hour  Intake 1547.6 ml  Output   2930 ml  Net -1382.4 ml     Physical Exam: General: Sitting in chair;  No resp difficulty,  HEENT: normal Neck: supple. JVP jaw; RIJ sleeve . Carotids 2+ bilat; no bruits. No lymphadenopathy or thryomegaly appreciated. Cor: Mechanical heart sounds with LVAD hum  present; multiple chest/pleural tubes. Lungs: diminished  Abdomen: soft, nondistended. No hepatosplenomegaly. No bruits or masses. +BS Driveline: C/D/I; securement device intact and driveline secure Extremities: no cyanosis, clubbing, rash, no edema Neuro: alert & orientedx3, cranial nerves grossly intact. moves all 4 extremities w/o difficulty. Affect pleasant  Telemetry: SR 80s  Labs: Basic Metabolic Panel:  Recent Labs Lab 09/18/13 2117  09/19/13 0350 09/19/13 1556 09/20/13 0430 09/20/13 1500 09/21/13 0404 09/21/13 1600 09/22/13 0435  NA 141  --  140 142 141 140 140 139 141  K 4.4  --  5.0 4.9 5.0 4.7 4.0 3.8 3.7  CL 103  --  102 104 103 103 101 100 103  CO2  --   --  22 22 20 21 22 23 24   GLUCOSE 118*  --  112* 100* 174* 126* 117* 125* 110*  BUN 22  --  25* 32* 39* 47* 50* 52* 49*  CREATININE 1.40*  < > 1.47* 1.83* 2.06* 2.03* 2.02* 1.84* 1.72*  CALCIUM  --   --  8.2* 8.9 8.7 8.6 8.4 8.6 8.2*  MG  --   < > 2.7* 2.5 2.7*  --  2.6*  --  2.5  PHOS  --   --  5.1*  --  5.6*  --  5.3*  --  3.7  < > = values in this interval not displayed.  Liver Function Tests:  Recent Labs Lab 09/19/13 0350 09/20/13 0430 09/21/13  0404 09/22/13 0435  AST 428* 417* 528* 266*  ALT 162* 270* 442* 373*  ALKPHOS 69 82 105 117  BILITOT 4.3* 5.7* 5.2* 3.2*  PROT 5.7* 6.0 6.0 5.9*  ALBUMIN 3.1* 3.0* 2.9* 2.6*   No results found for this basename: LIPASE, AMYLASE,  in the last 168 hours No results found for this basename: AMMONIA,  in the last 168 hours  CBC:  Recent Labs Lab 09/19/13 0350  09/20/13 0430 09/20/13 1500 09/21/13 0404 09/21/13 1600 09/22/13 0435  WBC 13.8*  < > 12.2* 11.5* 12.3* 12.9* 10.4  NEUTROABS 12.8*  --  11.5*  --  11.4*  --  9.4*  HGB 8.1*  < > 9.1* 8.9* 8.8* 9.0* 8.7*  HCT 24.1*  < > 27.0* 26.0* 26.0* 26.3* 26.0*  MCV 82.0  < > 82.6 83.1 82.8 81.9 82.5  PLT 158  < > 140* 121* 125* 132* 141*  < > = values in this interval not displayed.  INR:  Recent  Labs Lab 09/18/13 1500 09/19/13 0350 09/20/13 0430 09/21/13 0404 09/22/13 0435  INR 1.47 1.45 1.42 1.52* 1.38     Imaging: Dg Chest Port 1 View  09/21/2013   CLINICAL DATA:  Ventricular assist device.  EXAM: PORTABLE CHEST - 1 VIEW  COMPARISON:  One-view chest 09/20/2013.  FINDINGS: The patient has been extubated. The NG tube was removed. The tip of the Swan-Ganz catheter is stable in position. Pacing wires are stable. Bilateral chest tubes and mediastinal drains are stable. The ventricular assist device remains in place. Mild pulmonary vascular congestion is not significantly changed. Aeration at the bases is slightly improved.  IMPRESSION: 1. Interval extubation and removal of NG tube. 2. The support apparatus is otherwise stable. 3. Slight improved aeration at the lung bases bilaterally with persistent bilateral pulmonary vascular congestion.   Electronically Signed   By: Johnny Oneill M.D.   On: 09/21/2013 08:14     Medications:     Scheduled Medications: . acetaminophen  1,000 mg Oral Q6H   Or  . acetaminophen (TYLENOL) oral liquid 160 mg/5 mL  1,000 mg Per Tube Q6H  . antiseptic oral rinse  15 mL Mouth Rinse QID  . aspirin EC  325 mg Oral Daily   Or  . aspirin  324 mg Per Tube Daily   Or  . aspirin  300 mg Rectal Daily  . bisacodyl  10 mg Oral Daily   Or  . bisacodyl  10 mg Rectal Daily  . budesonide-formoterol  2 puff Inhalation BID  . chlorhexidine  15 mL Mouth/Throat BID  . docusate sodium  200 mg Oral Daily  . ezetimibe  10 mg Oral Daily  . insulin aspart  0-24 Units Subcutaneous Q4H  . levalbuterol  1.25 mg Nebulization Q6H  . levothyroxine  150 mcg Oral Custom   And  . levothyroxine  75 mcg Oral Custom  . metoCLOPramide (REGLAN) injection  10 mg Intravenous Q6H  . pantoprazole (PROTONIX) IV  40 mg Intravenous Q24H  . piperacillin-tazobactam (ZOSYN)  IV  3.375 g Intravenous Q8H  . potassium chloride  10 mEq Intravenous Q1 Hr x 3  . sodium chloride  3 mL  Intravenous Q12H  . vancomycin  1,250 mg Intravenous Q24H  . Warfarin - Physician Dosing Inpatient   Does not apply q1800    Infusions: . sodium chloride 20 mL/hr at 09/20/13 0900  . sodium chloride 10 mL/hr at 09/18/13 2000  . sodium chloride    . amiodarone (NEXTERONE  PREMIX) 360 mg/200 mL dextrose 30 mg/hr (09/22/13 0500)  . dexmedetomidine Stopped (09/20/13 0700)  . DOPamine 2 mcg/kg/min (09/22/13 0500)  . EPINEPHrine infusion Stopped (09/21/13 0500)  . lactated ringers 20 mL/hr at 09/20/13 0900  . milrinone 0.3 mcg/kg/min (09/22/13 0500)  . norepinephrine (LEVOPHED) Adult infusion Stopped (09/20/13 1200)    PRN Medications: sodium chloride, ALPRAZolam, hydrALAZINE, menthol-cetylpyridinium, midazolam, morphine injection, ondansetron (ZOFRAN) IV, oxyCODONE, potassium chloride, sodium chloride, zolpidem   Assessment:   1. 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 Acute on chronic renal failure, stage III  4. Acute blood loss anemia, expected - total 4 PRBCs, 1 FFP 5. AKI 6. Persistent fever   Plan/Discussion:    POD #4. Continues to make progress. He remains on milrinone 0.3, dopa 0.2 and amio. Co-ox is improved to 66%, will try to wean milrinone slowly. Will turn down to 0.2 today and continue to follow. Will also try to wean dopamine off. Still awaiting PICC. Renal function also improved. No afib overnight will transition to PO amio today. WBC 10.4 today. Will continue 1 more day of abx for total of 5 days post op. Check BMET and co-ox later today.   Volume status improving and he is down 5 lbs overnight. CVP 13 will give another 40 mg IV lasix today. Patient's PI elevated 7-8s will try to increase the pump speed to 9200 today and see if he tolerates. RAMP Echo later to assess speed increase and RV.  Continue to ambulate and work with PT.   INR 1.3 will boost coumadin today and if remains sub-therapeutic tomorrow will bridge with  heparin.   I reviewed the LVAD parameters from today, and compared the results to the patient's prior recorded data.  No programming changes were made.  The LVAD is functioning within specified parameters.  The patient performs LVAD self-test daily.  LVAD interrogation was negative for any significant power changes, alarms or PI events/speed drops.  LVAD equipment check completed and is in good working order.  Back-up equipment present.   LVAD education done on emergency procedures and precautions and reviewed exit site care.  Length of Stay: 9  Johnny Bame B MD  09/22/2013, 6:57 AM Advanced Heart Failure Team Pager [phone] (M-F; [place])  Please contact [facility] for night-coverage after hours (4p -7a ) and weekends on [email or web address]  VAD Team Pager [phone] (7am - 7am) For VAD issues only.  Patient seen with NP, agree with the above note.   PI higher, will increase LVAD speed to 9200 rpm today and get ramp echo this afternoon.   Will stop dopamine.  Decrease milrinone to 0.2.  Repeat co-ox this afternoon and decrease to 0.1 if remains good.   He is in NSR on amiodarone gtt, will change to po today.   Lasix 40 mg IV x 1.   Johnny Oneill 09/22/2013 8:21 AM

## 2013-09-22 NOTE — Progress Notes (Signed)
  Echocardiogram 2D Echocardiogram has been performed.  Mauricio Po 09/22/2013, 1:47 PM

## 2013-09-22 NOTE — Progress Notes (Signed)
Wanted to perform RAMP ECHO to assess LVAD pump speed to make sure optimized and evaluate RV, however apical pictures were difficult to see. Patient is tolerating increase in pump speed to 9200 so will leave at current speed. Will try to repeat RAMP once chest tubes discontinued and has less volume on board.  Junie Bame B NP-C 3:45 PM

## 2013-09-22 NOTE — Progress Notes (Signed)
Physical Therapy Treatment Patient Details Name: Johnny Oneill MRN: [id number] DOB: 26-Sep-1940 Today's Date: 09/22/2013 Time: [id number] PT Time Calculation (min): 64 min  PT Assessment / Plan / Recommendation  History of Present Illness Johnny Oneill is a 73 year old male with history of CAD s/p stent to LCX 2004 with re-occlusion of LCX with lateral MI 2009 and repeat stenting, total occlusion of RCA, 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 S/P L TKR 2013. Last Cath 2009..  [name]   PT Comments   Pt sitting up in recliner with complaint of fatigue.  Pt instructed in performing self test on controller, wearing of holster, how to switch from power supply to battery and back and keeping black bag and appropriate contents with him at all times. Pt able to assist with max A with switching from power suply <> battery    Follow Up Recommendations  Home health PT;Supervision/Assistance - 24 hour;Supervision for mobility/OOB     Does the patient have the potential to tolerate intense rehabilitation     Barriers to Discharge        Equipment Recommendations  Other (comment)    Recommendations for Other Services    Frequency Min 5X/week   Progress towards PT Goals Progress towards PT goals: Progressing toward goals  Plan Current plan remains appropriate    Precautions / Restrictions Precautions Precautions: Sternal;Other (comment) Precaution Comments: Multiple lines; LVAD equipment Restrictions Weight Bearing Restrictions: Yes Other Position/Activity Restrictions: Sternal precautions   Pertinent Vitals/Pain PI maintain with appropriate levels throughout, vitals stable.     Mobility  Bed Mobility Overal bed mobility: Needs Assistance Bed Mobility: Supine to Sit;Sit to Supine Supine to sit: Max assist;+2 for physical assistance Sit to supine: Total assist;+2 for physical  assistance General bed mobility comments: Assist for all aspects Transfers Overall transfer level: Needs assistance Equipment used: Pushed w/c Transfers: Sit to/from Stand;Stand Pivot Transfers Sit to Stand: Mod assist;+2 physical assistance Stand pivot transfers: Mod assist;+2 physical assistance General transfer comment: Pt required assist to move into standing.  Min A - mod A to maintain standing.  Pt took  two steps and pivoted to the bed Ambulation/Gait Ambulation/Gait assistance: +2 physical assistance;Mod assist Ambulation Distance (Feet): 5 Feet Assistive device:  (pushing w/c) Gait Pattern/deviations: Step-to pattern;Shuffle;Staggering left;Staggering right;Trunk flexed;Wide base of support    Exercises General Exercises - Lower Extremity Heel Slides: AAROM;Both;10 reps;Supine Hip ABduction/ADduction: AAROM;Both;15 reps;Supine   PT Diagnosis:    PT Problem List:   PT Treatment Interventions:     PT Goals (current goals can now be found in the care plan section) Acute Rehab PT Goals Patient Stated Goal: to get better PT Goal Formulation: With patient Time For Goal Achievement: 10/04/13 Potential to Achieve Goals: Good  Visit Information  Assistance Needed: +2 Reason for Co-Treatment: Complexity of the patient's impairments (multi-system involvement) PT goals addressed during session: Mobility/safety with mobility History of Present Illness: Johnny Oneill is a 73 year old male with history of CAD s/p stent to LCX 2004 with re-occlusion of LCX with lateral MI 2009 and repeat stenting, total occlusion of RCA, 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 S/P L TKR 2013. Last Cath 2009..  [name]    Subjective Data  Subjective: I'm so tired.Marland KitchenMarland KitchenI'm not going to be able to do that Patient Stated Goal: to  get better   Cognition  Cognition Arousal/Alertness: Lethargic Behavior  During Therapy: Flat affect Overall Cognitive Status: Within Functional Limits for tasks assessed    Balance  Balance Overall balance assessment: Needs assistance Sitting-balance support: Feet supported;No upper extremity supported Sitting balance - Comments: Pt with posterior leand and flexed trunk and head/neck.  Requires verbal cues for erect posture Standing balance support: Bilateral upper extremity supported Standing balance-Leahy Scale: Poor Standing balance comment: Pt with flexed posture. Pt requires min - mod A (variable) to maintain standing balance  End of Session PT - End of Session Equipment Utilized During Treatment: Oxygen Activity Tolerance: Patient tolerated treatment well;Patient limited by fatigue Patient left: with call bell/phone within reach;in chair Nurse Communication: Mobility status   GP     Eragon Hammond, Tessie Fass 09/22/2013, 5:26 PM 09/22/2013  Donnella Sham, [place] [phone]  (pager)

## 2013-09-22 NOTE — Progress Notes (Signed)
CSW met with wife at bedside. She reports patient is doing well and she is "amazed" at his progression so far. Wife reports she is coping well and has good support from her church community. She has taken some time for herself and went to the gym to help relieve her stress. Wife appears to be coping well with current hospitalization. CSW will continue to follow for support and any other psychosocial needs. Jackie , LCSW [phone] 

## 2013-09-22 NOTE — Plan of Care (Signed)
Problem: Phase II Progression Outcomes Goal: Tolerates weaning with O2 Sat > 90 Outcome: Progressing Down to 3 L and weaning nitric Goal: CBGs/Blood glucose < or equal to 120 Outcome: Progressing Continues on SSI and CBG every 4 hours. Covered x 1

## 2013-09-22 NOTE — Progress Notes (Signed)
Patient ID: Johnny Oneill, male   DOB: 1941-01-21, 73 y.o.   MRN: [id number]  SICU Evening Rounds:  Hemodynamically stable. MAP 98-101 on Milrinone 0.2 and NO by [place]  Pump speed increased to 9200 today. PI 6.8 this pm. Still has pulsatile arterial tracing.  CO-OX 55% this afternoon.  Urine output ok. BMET    Component Value Date/Time   NA 143 09/22/2013 1400   K 3.9 09/22/2013 1400   CL 104 09/22/2013 1400   CO2 23 09/22/2013 1400   GLUCOSE 127* 09/22/2013 1400   BUN 48* 09/22/2013 1400   BUN 21 05/19/2010   CREATININE 1.56* 09/22/2013 1400   CALCIUM 8.1* 09/22/2013 1400   GFRNONAA 43* 09/22/2013 1400   GFRAA 50* 09/22/2013 1400    He has a BM today.  Awake, alert. Comfortable but tired.  Continue current plans tonight.

## 2013-09-22 NOTE — Progress Notes (Signed)
This note also relates to the following rows which could not be included: Pulse Rate - Cannot attach notes to unvalidated device data ECG Heart Rate - Cannot attach notes to unvalidated device data Resp - Cannot attach notes to unvalidated device data SpO2 - Cannot attach notes to unvalidated device data  MAP 94. Dopamine decreased per order to 1 mcgkg/min and milrinone to 0.2 mcg/kg/min at 0800. Apresoline 10 mg IV given for MAP > 90 at 0810. Lasix 40 mg IV given per order at 0815.  Heartmate II pump speed increased to 9200 per Dr. Darcey [name] at 0800. Will continue to monitor.

## 2013-09-22 NOTE — Progress Notes (Signed)
Agree with the above.  Can re-attempt ramp study when chest tubes out.   Loralie Champagne 09/22/2013

## 2013-09-22 NOTE — Progress Notes (Signed)
Peripherally Inserted Central Catheter/Midline Placement  The IV Nurse has discussed with the patient and/or persons authorized to consent for the patient, the purpose of this procedure and the potential benefits and risks involved with this procedure.  The benefits include less needle sticks, lab draws from the catheter and patient may be discharged home with the catheter.  Risks include, but not limited to, infection, bleeding, blood clot (thrombus formation), and puncture of an artery; nerve damage and irregular heat beat.  Alternatives to this procedure were also discussed.  PICC/Midline Placement Documentation        Johnny Oneill 09/22/2013, 3:24 PM

## 2013-09-22 NOTE — Progress Notes (Signed)
MAP arterial 104. Hydralazine 10 mg IV given per orders.  09/22/13 1851  Vitals  Pulse Rate 88  ECG Heart Rate 88  Resp ! 22  Oxygen Therapy  SpO2 100 %  Art Line  Arterial Line BP 123/91 mmHg  Arterial Line MAP (mmHg) 104 mmHg  Invasive Hemodynamic Monitoring  CVP (mmHg) 17 mmHg

## 2013-09-22 NOTE — Progress Notes (Signed)
Occupational Therapy Treatment Patient Details Name: Johnny Oneill MRN: [id number] DOB: 02/16/1941 Today's Date: 09/22/2013 Time: 1011-1110 OT Time Calculation (min): 59 min  OT Assessment / Plan / Recommendation  History of present illness Johnny Oneill is a 73 year old male with history of CAD s/p stent to LCX 2004 with re-occlusion of LCX with lateral MI 2009 and repeat stenting, total occlusion of RCA, 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 S/P L TKR 2013. Last Cath 2009..  [name]   OT comments  Pt fatigued this morning.  He was up in chair, took two steps, then pivoted to bed.  Began instruction with LVAD equipment - switching power<>battery - required max A.  Vitals remained stable throughout session.  Follow Up Recommendations  No OT follow up;Supervision/Assistance - 24 hour    Barriers to Discharge       Equipment Recommendations  Other (comment) (will need a rollator)    Recommendations for Other Services    Frequency Min 3X/week   Progress towards OT Goals Progress towards OT goals: Progressing toward goals  Plan Discharge plan remains appropriate    Precautions / Restrictions Precautions Precautions: Sternal;Other (comment) Precaution Comments: Multiple lines; LVAD equipment Restrictions Weight Bearing Restrictions: Yes Other Position/Activity Restrictions: Sternal precautions   Pertinent Vitals/Pain     ADL  Toilet Transfer: Moderate assistance (+2) Toilet Transfer Method: Sit to stand;Stand pivot Transfers/Ambulation Related to ADLs: +2 mod A to transfer recliner to bed ADL Comments: Pt sitting up in recliner with complaint of fatigue.  Pt instructed in performing self test on controller, wearing of holster, how to switch from power supply to battery and back and keeping black bag and appropriate contents with him at all times. Pt able to assist with max A  with switching from power suply <> battery     OT Diagnosis:    OT Problem List:   OT Treatment Interventions:     OT Goals(current goals can now be found in the care plan section) ADL Goals Pt Will Perform Grooming: with min guard assist;standing Pt Will Perform Upper Body Bathing: with supervision;sitting Pt Will Perform Lower Body Bathing: with min assist;sit to/from stand Pt Will Perform Upper Body Dressing: with supervision;sitting Pt Will Perform Lower Body Dressing: with min assist;sit to/from stand Pt Will Transfer to Toilet: with min guard assist;ambulating;bedside commode Pt Will Perform Toileting - Clothing Manipulation and hygiene: with min guard assist;sit to/from stand Additional ADL Goal #1: Pt will be independent with sternal precautions during all BADLs and functional mobility Additional ADL Goal #2: Pt will be independent with managing LVAD equipment   Visit Information  Last OT Received On: 09/22/13 Assistance Needed: +2 PT/OT/SLP Co-Evaluation/Treatment: Yes Reason for Co-Treatment: For patient/therapist safety;Complexity of the patient's impairments (multi-system involvement) OT goals addressed during session: ADL's and self-care;Other (comment) (functional transfers) History of Present Illness: Johnny Oneill is a 73 year old male with history of CAD s/p stent to LCX 2004 with re-occlusion of LCX with lateral MI 2009 and repeat stenting, total occlusion of RCA, 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 S/P L TKR 2013. Last Cath 2009..  [name]    Subjective Data      Prior Functioning       Cognition  Cognition Arousal/Alertness: Lethargic Behavior During Therapy: Flat affect Overall Cognitive Status: Within Functional Limits  for tasks assessed    Mobility  Bed Mobility Overal bed mobility: Needs Assistance Sit to supine: Total assist;+2 for physical  assistance General bed mobility comments: Assist for all aspects Transfers Overall transfer level: Needs assistance Equipment used: Pushed w/c Transfers: Sit to/from Stand;Stand Pivot Transfers Sit to Stand: Mod assist;+2 physical assistance Stand pivot transfers: Mod assist;+2 physical assistance General transfer comment: Pt required assist to move into standing.  Min A - mod A to maintain standing.  Pt took  two steps and pivoted to the bed    Exercises      Balance Balance Overall balance assessment: Needs assistance Sitting-balance support: Feet supported;No upper extremity supported Sitting balance-Leahy Scale: Poor Sitting balance - Comments: Pt with posterior leand and flexed trunk and head/neck.  Requires verbal cues for erect posture Postural control: Posterior lean Standing balance support: Bilateral upper extremity supported Standing balance-Leahy Scale: Poor Standing balance comment: Pt with flexed posture.  Pt requires min - mod A (variable) to maintain standing balance  End of Session OT - End of Session Equipment Utilized During Treatment: Oxygen;Other (comment) (LVAD) Activity Tolerance: Patient tolerated treatment well Patient left: with call bell/phone within reach;in chair Nurse Communication: Mobility status  Van Oneill, Johnny Artis M 09/22/2013, 11:58 AM

## 2013-09-22 NOTE — Plan of Care (Signed)
Problem: Phase I Progression Outcomes Goal: PT screen (if indicated) Outcome: Completed/Met Date Met:  09/22/13 PT and OT are seeing.

## 2013-09-23 ENCOUNTER — Inpatient Hospital Stay (HOSPITAL_COMMUNITY): Payer: Medicare Other

## 2013-09-23 DIAGNOSIS — Z95818 Presence of other cardiac implants and grafts: Secondary | ICD-10-CM

## 2013-09-23 DIAGNOSIS — I5023 Acute on chronic systolic (congestive) heart failure: Secondary | ICD-10-CM

## 2013-09-23 LAB — CARBOXYHEMOGLOBIN
Carboxyhemoglobin: 1.8 % — ABNORMAL HIGH (ref 0.5–1.5)
Methemoglobin: 1.4 % (ref 0.0–1.5)
O2 Saturation: 65.6 %
Total hemoglobin: 12.9 g/dL — ABNORMAL LOW (ref 13.5–18.0)

## 2013-09-23 LAB — COMPREHENSIVE METABOLIC PANEL
ALT: 305 U/L — ABNORMAL HIGH (ref 0–53)
AST: 151 U/L — ABNORMAL HIGH (ref 0–37)
Albumin: 2.5 g/dL — ABNORMAL LOW (ref 3.5–5.2)
Alkaline Phosphatase: 121 U/L — ABNORMAL HIGH (ref 39–117)
BUN: 47 mg/dL — ABNORMAL HIGH (ref 6–23)
CO2: 24 mEq/L (ref 19–32)
Calcium: 8.2 mg/dL — ABNORMAL LOW (ref 8.4–10.5)
Chloride: 103 mEq/L (ref 96–112)
Creatinine, Ser: 1.57 mg/dL — ABNORMAL HIGH (ref 0.50–1.35)
GFR calc Af Amer: 50 mL/min — ABNORMAL LOW (ref >90)
GFR calc non Af Amer: 43 mL/min — ABNORMAL LOW (ref >90)
Glucose, Bld: 106 mg/dL — ABNORMAL HIGH (ref 70–99)
Potassium: 3.8 mEq/L (ref 3.7–5.3)
Sodium: 141 mEq/L (ref 137–147)
Total Bilirubin: 2.4 mg/dL — ABNORMAL HIGH (ref 0.3–1.2)
Total Protein: 6.1 g/dL (ref 6.0–8.3)

## 2013-09-23 LAB — GLUCOSE, CAPILLARY
Glucose-Capillary: 100 mg/dL — ABNORMAL HIGH (ref 70–99)
Glucose-Capillary: 117 mg/dL — ABNORMAL HIGH (ref 70–99)
Glucose-Capillary: 122 mg/dL — ABNORMAL HIGH (ref 70–99)
Glucose-Capillary: 128 mg/dL — ABNORMAL HIGH (ref 70–99)
Glucose-Capillary: 98 mg/dL (ref 70–99)
Glucose-Capillary: 99 mg/dL (ref 70–99)

## 2013-09-23 LAB — CBC
HCT: 28 % — ABNORMAL LOW (ref 39.0–52.0)
Hemoglobin: 9.1 g/dL — ABNORMAL LOW (ref 13.0–17.0)
MCH: 26.8 pg (ref 26.0–34.0)
MCHC: 32.5 g/dL (ref 30.0–36.0)
MCV: 82.6 fL (ref 78.0–100.0)
Platelets: 176 10*3/uL (ref 150–400)
RBC: 3.39 MIL/uL — ABNORMAL LOW (ref 4.22–5.81)
RDW: 18 % — ABNORMAL HIGH (ref 11.5–15.5)
WBC: 9.2 10*3/uL (ref 4.0–10.5)

## 2013-09-23 LAB — CBC WITH DIFFERENTIAL/PLATELET
Basophils Absolute: 0 10*3/uL (ref 0.0–0.1)
Basophils Relative: 0 % (ref 0–1)
Eosinophils Absolute: 0.1 10*3/uL (ref 0.0–0.7)
Eosinophils Relative: 1 % (ref 0–5)
HCT: 27.1 % — ABNORMAL LOW (ref 39.0–52.0)
Hemoglobin: 8.9 g/dL — ABNORMAL LOW (ref 13.0–17.0)
Lymphocytes Relative: 5 % — ABNORMAL LOW (ref 12–46)
Lymphs Abs: 0.5 10*3/uL — ABNORMAL LOW (ref 0.7–4.0)
MCH: 26.9 pg (ref 26.0–34.0)
MCHC: 32.8 g/dL (ref 30.0–36.0)
MCV: 81.9 fL (ref 78.0–100.0)
Monocytes Absolute: 0.5 10*3/uL (ref 0.1–1.0)
Monocytes Relative: 5 % (ref 3–12)
Neutro Abs: 8.8 10*3/uL — ABNORMAL HIGH (ref 1.7–7.7)
Neutrophils Relative %: 90 % — ABNORMAL HIGH (ref 43–77)
Platelets: 158 10*3/uL (ref 150–400)
RBC: 3.31 MIL/uL — ABNORMAL LOW (ref 4.22–5.81)
RDW: 17.8 % — ABNORMAL HIGH (ref 11.5–15.5)
WBC: 9.8 10*3/uL (ref 4.0–10.5)

## 2013-09-23 LAB — BLOOD GAS, ARTERIAL
Acid-Base Excess: 0.5 mmol/L (ref 0.0–2.0)
Bicarbonate: 24.1 mEq/L — ABNORMAL HIGH (ref 20.0–24.0)
Drawn by: 252031
O2 Content: 4 L/min
O2 Saturation: 94.1 %
Patient temperature: 98.6
TCO2: 25.1 mmol/L (ref 0–100)
pCO2 arterial: 35.2 mmHg (ref 35.0–45.0)
pH, Arterial: 7.449 (ref 7.350–7.450)
pO2, Arterial: 72.9 mmHg — ABNORMAL LOW (ref 80.0–100.0)

## 2013-09-23 LAB — PROTIME-INR
INR: 2.09 — ABNORMAL HIGH (ref 0.00–1.49)
Prothrombin Time: 22.8 seconds — ABNORMAL HIGH (ref 11.6–15.2)

## 2013-09-23 LAB — CULTURE, BLOOD (ROUTINE X 2)
Culture: NO GROWTH
Culture: NO GROWTH

## 2013-09-23 LAB — LACTATE DEHYDROGENASE: LDH: 516 U/L — ABNORMAL HIGH (ref 94–250)

## 2013-09-23 LAB — MAGNESIUM: Magnesium: 2.5 mg/dL (ref 1.5–2.5)

## 2013-09-23 LAB — PHOSPHORUS: Phosphorus: 3.3 mg/dL (ref 2.3–4.6)

## 2013-09-23 MED ORDER — POTASSIUM CHLORIDE 10 MEQ/50ML IV SOLN
10.0000 meq | INTRAVENOUS | Status: AC
Start: 2013-09-23 — End: 2013-09-23
  Administered 2013-09-23: 10 meq via INTRAVENOUS
  Filled 2013-09-23 (×2): qty 50

## 2013-09-23 MED ORDER — INSULIN ASPART 100 UNIT/ML ~~LOC~~ SOLN: 0.0000 [IU] | Freq: Every day | SUBCUTANEOUS | Status: DC

## 2013-09-23 MED ORDER — DOCUSATE SODIUM 100 MG PO CAPS: 200.0000 mg | ORAL_CAPSULE | Freq: Every day | ORAL | Status: DC | PRN

## 2013-09-23 MED ORDER — FUROSEMIDE 40 MG PO TABS
40.0000 mg | ORAL_TABLET | Freq: Every day | ORAL | Status: DC
  Administered 2013-09-23: 40 mg via ORAL
  Filled 2013-09-23 (×2): qty 1

## 2013-09-23 MED ORDER — PANTOPRAZOLE SODIUM 40 MG PO TBEC
40.0000 mg | DELAYED_RELEASE_TABLET | Freq: Every day | ORAL | Status: DC
  Administered 2013-09-23 – 2013-10-03 (×11): 40 mg via ORAL
  Filled 2013-09-23 (×11): qty 1

## 2013-09-23 MED ORDER — TRAMADOL HCL 50 MG PO TABS
50.0000 mg | ORAL_TABLET | Freq: Four times a day (QID) | ORAL | Status: DC | PRN
  Administered 2013-09-25 – 2013-10-03 (×5): 50 mg via ORAL
  Filled 2013-09-23 (×5): qty 1

## 2013-09-23 MED ORDER — VANCOMYCIN HCL 10 G IV SOLR
1250.0000 mg | INTRAVENOUS | Status: AC
  Administered 2013-09-23: 1250 mg via INTRAVENOUS
  Filled 2013-09-23: qty 1250

## 2013-09-23 MED ORDER — INSULIN ASPART 100 UNIT/ML ~~LOC~~ SOLN
0.0000 [IU] | Freq: Three times a day (TID) | SUBCUTANEOUS | Status: DC
  Administered 2013-09-23: 2 [IU] via SUBCUTANEOUS

## 2013-09-23 MED ORDER — WARFARIN SODIUM 2 MG PO TABS
2.0000 mg | ORAL_TABLET | Freq: Once | ORAL | Status: AC
  Administered 2013-09-23: 2 mg via ORAL
  Filled 2013-09-23: qty 1

## 2013-09-23 NOTE — Progress Notes (Signed)
HeartMate 2 Rounding Note  Subjective:   Johnny Oneill is a 73 year old patient with ischemic cardiomyopathy with advanced heart failure on the transplant waiting list at [facility] and supported with home milrinone since April 2014. On this admission he was admitted with acute on chronic heart failure was found to be in cardiogenic shock which responded only to increased dose of milrinone and placement of intra-aortic balloon pump. The patient was discussed at the [facility] conference and felt to be an appropriate candidate for LVAD implantation as a bridge to transplantation. Prior to surgery the details of LVAD implantation were thoroughly discussed with patient and his wife including the expected postop recovery in the long-term postoperative care and followup.  Postop day 5  LVAD implantation Intraoperative and postoperative course complicated by friable LV myocardium at the apical cannulation site resulting in bleeding from suture tears in the myocardium repaired by multiple extra pledgeted sutures placed before separating from cardio coronary bypass.  Patient had stable night, good LVAD flows with a single PI event  CVP remains 14-15 cm H2O, LVAD flow remains 9200  Echocardiogram yesterday shows neutral position of ventricular septum, good RV function, minimal aortic valve opening  Maintaining sinus rhythm on oral amiodarone Mean arterial pressures stable 80-90 Remains only on low-dose milrinone 0.2, nitric oxide via nasal cannula weaned off Urine output adequate, creatinine improved and weight about at baseline Bowel movement x2 yesterday   Chest x-ray shows improved aeration after removal bilateral pulmonary tubes. Mediastinal tube still draining significant serosanguineous fluid but hematocrit remained stable  Patient has been afebrile, normal white coun, t finishing 5 days of antibiotics to cover persistent pre-and postoperative fever but with negative cultures   Patient slept  well overnight and is ready to try ambulation again today with PT  LVAD INTERROGATION:  HeartMate II LVAD:  Flow 3.5 liters/min, speed 9200 RPM power 4.4 PI 6.4  .   Objective:    Vital Signs:   Temp:  [97.3 F (36.3 C)-98.2 F (36.8 C)] 97.3 F (36.3 C) (01/24 0746) Pulse Rate:  [60-102] 102 (01/24 0700) Resp:  [13-33] 33 (01/24 0700) SpO2:  [91 %-100 %] 96 % (01/24 0600) Arterial Line BP: (84-123)/(67-92) 89/75 mmHg (01/24 0700) Weight:  [160 lb 11.5 oz (72.9 kg)] 160 lb 11.5 oz (72.9 kg) (01/24 0500) Last BM Date: 09/17/13 Mean arterial Pressure 80-90 mmHg  Intake/Output:   Intake/Output Summary (Last 24 hours) at 09/23/13 0849 Last data filed at 09/23/13 0800  Gross per 24 hour  Intake 1596.99 ml  Output   2395 ml  Net -798.01 ml     Physical Exam: General: Extubated and responsive, good color HEENT: normal, minimal edema Neck: supple. JVP .  no bruits. No lymphadenopathy or thryomegaly appreciated. Cor: Mechanical heart sounds with LVAD hum present. Lungs: Scattered rhonchi, chest tubes with persistent sero.-sanguinous drainage Abdomen: soft, nontender, nondistended. No hepatosplenomegaly. No bruits or masses. Good bowel sounds  Driveline: C/D/I; securement device intact and driveline incorporated Extremities: no cyanosis, clubbing, rash, edema, periphery warm and well perfused Neuro: Appropriate mental status, moves extremities  Telemetry: Sinus rhythm 80-90 per minute  Labs: Basic Metabolic Panel:  Recent Labs Lab 09/19/13 0350 09/19/13 1556 09/20/13 0430  09/21/13 0404 09/21/13 1600 09/22/13 0435 09/22/13 1400 09/23/13 0430  NA 140 142 141  < > 140 139 141 143 141  K 5.0 4.9 5.0  < > 4.0 3.8 3.7 3.9 3.8  CL 102 104 103  < > 101 100 103 104 103  CO2 22 22 20   < > 22 23 24 23 24   GLUCOSE 112* 100* 174*  < > 117* 125* 110* 127* 106*  BUN 25* 32* 39*  < > 50* 52* 49* 48* 47*  CREATININE 1.47* 1.83* 2.06*  < > 2.02* 1.84* 1.72* 1.56* 1.57*  CALCIUM  8.2* 8.9 8.7  < > 8.4 8.6 8.2* 8.1* 8.2*  MG 2.7* 2.5 2.7*  --  2.6*  --  2.5  --  2.5  PHOS 5.1*  --  5.6*  --  5.3*  --  3.7  --  3.3  < > = values in this interval not displayed.  Liver Function Tests:  Recent Labs Lab 09/19/13 0350 09/20/13 0430 09/21/13 0404 09/22/13 0435 09/23/13 0430  AST 428* 417* 528* 266* 151*  ALT 162* 270* 442* 373* 305*  ALKPHOS 69 82 105 117 121*  BILITOT 4.3* 5.7* 5.2* 3.2* 2.4*  PROT 5.7* 6.0 6.0 5.9* 6.1  ALBUMIN 3.1* 3.0* 2.9* 2.6* 2.5*   No results found for this basename: LIPASE, AMYLASE,  in the last 168 hours No results found for this basename: AMMONIA,  in the last 168 hours  CBC:  Recent Labs Lab 09/19/13 0350  09/20/13 0430 09/20/13 1500 09/21/13 0404 09/21/13 1600 09/22/13 0435 09/23/13 0430  WBC 13.8*  < > 12.2* 11.5* 12.3* 12.9* 10.4 9.8  NEUTROABS 12.8*  --  11.5*  --  11.4*  --  9.4* 8.8*  HGB 8.1*  < > 9.1* 8.9* 8.8* 9.0* 8.7* 8.9*  HCT 24.1*  < > 27.0* 26.0* 26.0* 26.3* 26.0* 27.1*  MCV 82.0  < > 82.6 83.1 82.8 81.9 82.5 81.9  PLT 158  < > 140* 121* 125* 132* 141* 158  < > = values in this interval not displayed.  INR:  Recent Labs Lab 09/19/13 0350 09/20/13 0430 09/21/13 0404 09/22/13 0435 09/23/13 0430  INR 1.45 1.42 1.52* 1.38 2.09*    Other results:  EKG:   Imaging: Dg Chest Port 1 View  09/23/2013   CLINICAL DATA:  Ventricular assist device  EXAM: PORTABLE CHEST - 1 VIEW  COMPARISON:  Yesterday  FINDINGS: Right PICC placed. Tip is at the cavoatrial junction. Right internal jugular introducer sheath removed. Ventricular assist device, AICD device, and right internal jugular central venous catheter stable. Mediastinal tube stable. Cardiomegaly. No pneumothorax. Bibasilar atelectasis stable. Stable mild vascular congestion.  IMPRESSION: PICC placed.  Tip is at the cavoatrial junction.  Stable bibasilar atelectasis and vascular congestion.  No pneumothorax.   Electronically Signed   By: Maryclare Bean M.D.   On:  09/23/2013 07:52   Dg Chest Port 1 View  09/22/2013   CLINICAL DATA:  Ventricular assist device  EXAM: PORTABLE CHEST - 1 VIEW  COMPARISON:  Portable chest x-ray dated September 21, 2012  FINDINGS: The lungs are reasonably well inflated. The interstitial markings in the mid and lower lung zones are slightly less conspicuous today. The cardiopericardial silhouette remains enlarged. The pulmonary vascularity is less engorged.  The ventricular assist device appears unchanged in position. The permanent pacemaker defibrillator leads appear stable in position. The right internal jugular venous catheter tip lies at the level of the proximal SVC. The right-sided internal jugular Cordis sheath remains. The Swan-Ganz type catheter has been removed. The right sided chest tube tip lies medially in the mid hemi thorax. The right lower mediastinal drain/ chest tube has been removed. A midline mediastinal drain appears stable in position. The upper left-sided chest tube tip lies  in the region of the left pulmonary apex. The lower left chest tube is partially obscured by the ventricular assist device but is likely unchanged.  IMPRESSION: 1. There has been mild interval improvement in the appearance of the pulmonary interstitium suggesting resolving interstitial edema. There remain findings of low-grade CHF. 2. The support tubes and lines appear stable. The Swan-Ganz catheter and the lower right-sided mediastinal drain/chest tube have been removed.   Electronically Signed   By: David  Martinique   On: 09/22/2013 07:42     Medications:     Scheduled Medications: . acetaminophen  1,000 mg Oral Q6H   Or  . acetaminophen (TYLENOL) oral liquid 160 mg/5 mL  1,000 mg Per Tube Q6H  . amiodarone  400 mg Oral Daily  . antiseptic oral rinse  15 mL Mouth Rinse QID  . aspirin EC  325 mg Oral Daily   Or  . aspirin  324 mg Per Tube Daily   Or  . aspirin  300 mg Rectal Daily  . budesonide-formoterol  2 puff Inhalation BID  .  chlorhexidine  15 mL Mouth/Throat BID  . citalopram  20 mg Oral Daily  . docusate sodium  200 mg Oral Daily  . ezetimibe  10 mg Oral Daily  . ferrous [id number] C-folic acid  1 capsule Oral q morning - 10a  . furosemide  40 mg Oral Daily  . insulin aspart  0-15 Units Subcutaneous TID WC  . insulin aspart  0-5 Units Subcutaneous QHS  . levothyroxine  150 mcg Oral Custom   And  . levothyroxine  75 mcg Oral Custom  . pantoprazole  40 mg Oral Q1200  . potassium chloride  10 mEq Intravenous Q1 Hr x 2  . sodium chloride  10-40 mL Intracatheter Q12H  . vancomycin  1,250 mg Intravenous Q24H  . warfarin  2 mg Oral ONCE-1800  . Warfarin - Physician Dosing Inpatient   Does not apply q1800    Infusions: . sodium chloride 10 mL/hr at 09/18/13 2000  . sodium chloride    . milrinone 0.2 mcg/kg/min (09/22/13 1704)    PRN Medications: sodium chloride, ALPRAZolam, hydrALAZINE, menthol-cetylpyridinium, midazolam, morphine injection, ondansetron (ZOFRAN) IV, oxyCODONE, potassium chloride, sodium chloride, sodium chloride, traMADol, zolpidem   Assessment:  1 acute on chronic systolic heart failure EF 20% requiring implantable LVAD January 19,015 2 ischemic cardiomyopathy 3 perioperative coagulopathy now improved 4 ventilator dependent respiratory insufficiency due to pulmonary edema, possible ARDS from cardio pulmonary bypass-now resolved and patient is extubated   Plan/Discussion:    Patient is showing progressive improvement. Will remove A-line and IJ line, maintained mediastinal and pocket drains and continue with ambulation-physical therapy  INR 2.1, will dose Coumadin 2 mg today  We'll stop empiric  antibiotics later today  Continue low-dose milrinone   I reviewed the LVAD parameters from today, and compared the results to the patient's prior recorded data.  No programming changes were made.  The LVAD is functioning within specified parameters.  The patient performs LVAD  self-test daily.  LVAD interrogation was negative for any significant power changes, alarms or PI events/speed drops.  LVAD equipment check completed and is in good working order.  Back-up equipment present.   LVAD education done on emergency procedures and precautions and reviewed exit site care.  Length of Stay: [place] 09/23/2013, 8:49 AM  VAD Team Pager [phone] (7am - 7am)

## 2013-09-23 NOTE — Progress Notes (Signed)
Patient ID: Johnny Oneill, male   DOB: 04-21-1941, 73 y.o.   MRN: [id number]  SICU Evening Rounds:  Hemodynamically stable  MAP 100 by a-line 80 by cuff  PI 6.8  Off NO. Only on milrinone 0.125  Urine output ok. Urine pink and complaining of foley so will remove it.

## 2013-09-23 NOTE — Progress Notes (Signed)
Pt foley catheter causing irritation to patient.  Discussed with MD. Orders received, foley removed without complication.

## 2013-09-23 NOTE — Progress Notes (Signed)
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 S/P L TKR 2013.   Admitted for VAD work up. Swan placed. IABP placed due to low CO-OX and worsening renal function.   POD #5 LVAD implantation for BTT. LV was friable and soft causing lots of bleeding. Received DDAVP along with multiple blood products.   Doing well. Remains in SR on amio. Diuresing steadily. Remains on low-dose milrinone. Other drips off. Tolerating 9200. One PI event overnight. MAPS in 24s. Still with drainage from MT tube . Abx being stopped today.    CVP 13-14 Co-ox 66%  Cr 1.47>1.83>2.06>2.03>1.72>1.57 INR 2.1 WBC 13.8>10.2>12.2>12.3>10.4>9.8 Hgb 8.1>7.9>9.1>8.8>8.7  LVAD INTERROGATION:  HeartMate II LVAD: Flow 3.5 liters/min, speed 9200 RPM power 4.4 PI 6.4 .    Objective:    Vital Signs:   Temp:  [97.3 F (36.3 C)-98.2 F (36.8 C)] 97.3 F (36.3 C) (01/24 0746) Pulse Rate:  [60-102] 102 (01/24 0700) Resp:  [13-33] 33 (01/24 0700) SpO2:  [91 %-100 %] 96 % (01/24 0600) Arterial Line BP: (84-123)/(71-92) 89/75 mmHg (01/24 0700) Weight:  [72.9 kg (160 lb 11.5 oz)] 72.9 kg (160 lb 11.5 oz) (01/24 0500) Last BM Date: 09/17/13 Mean arterial Pressure 80-90s  Intake/Output:   Intake/Output Summary (Last 24 hours) at 09/23/13 0942 Last data filed at 09/23/13 0800  Gross per 24 hour  Intake 1504.39 ml  Output   2265 ml  Net -760.61 ml     Physical Exam: General: Sitting in chair;  No resp difficulty,  HEENT: normal Neck: supple. JVP jaw; RIJ sleeve . Carotids 2+ bilat; no bruits. No lymphadenopathy or thryomegaly appreciated. Cor: Mechanical heart sounds with LVAD hum present; multiple chest/pleural tubes. Lungs:  diminished  Abdomen: soft, nondistended. No hepatosplenomegaly. No bruits or masses. +BS Driveline: C/D/I; securement device intact and driveline secure Extremities: no cyanosis, clubbing, rash, no edema Neuro: alert & orientedx3, cranial nerves grossly intact. moves all 4 extremities w/o difficulty. Affect pleasant  Telemetry: SR 80s  Labs: Basic Metabolic Panel:  Recent Labs Lab 09/19/13 0350 09/19/13 1556 09/20/13 0430  09/21/13 0404 09/21/13 1600 09/22/13 0435 09/22/13 1400 09/23/13 0430  NA 140 142 141  < > 140 139 141 143 141  K 5.0 4.9 5.0  < > 4.0 3.8 3.7 3.9 3.8  CL 102 104 103  < > 101 100 103 104 103  CO2 22 22 20   < > 22 23 24 23 24   GLUCOSE 112* 100* 174*  < > 117* 125* 110* 127* 106*  BUN 25* 32* 39*  < > 50* 52* 49* 48* 47*  CREATININE 1.47* 1.83* 2.06*  < > 2.02* 1.84* 1.72* 1.56* 1.57*  CALCIUM 8.2* 8.9 8.7  < > 8.4 8.6 8.2* 8.1* 8.2*  MG 2.7* 2.5 2.7*  --  2.6*  --  2.5  --  2.5  PHOS 5.1*  --  5.6*  --  5.3*  --  3.7  --  3.3  < > = values in this interval not displayed.  Liver Function Tests:  Recent Labs Lab 09/19/13 0350 09/20/13 0430 09/21/13 0404 09/22/13 0435 09/23/13 0430  AST 428* 417* 528* 266* 151*  ALT 162* 270* 442* 373* 305*  ALKPHOS  69 82 105 117 121*  BILITOT 4.3* 5.7* 5.2* 3.2* 2.4*  PROT 5.7* 6.0 6.0 5.9* 6.1  ALBUMIN 3.1* 3.0* 2.9* 2.6* 2.5*   No results found for this basename: LIPASE, AMYLASE,  in the last 168 hours No results found for this basename: AMMONIA,  in the last 168 hours  CBC:  Recent Labs Lab 09/19/13 0350  09/20/13 0430 09/20/13 1500 09/21/13 0404 09/21/13 1600 09/22/13 0435 09/23/13 0430  WBC 13.8*  < > 12.2* 11.5* 12.3* 12.9* 10.4 9.8  NEUTROABS 12.8*  --  11.5*  --  11.4*  --  9.4* 8.8*  HGB 8.1*  < > 9.1* 8.9* 8.8* 9.0* 8.7* 8.9*  HCT 24.1*  < > 27.0* 26.0* 26.0* 26.3* 26.0* 27.1*  MCV 82.0  < > 82.6 83.1 82.8 81.9 82.5 81.9  PLT 158  < > 140* 121* 125* 132* 141* 158  < > = values in this  interval not displayed.  INR:  Recent Labs Lab 09/19/13 0350 09/20/13 0430 09/21/13 0404 09/22/13 0435 09/23/13 0430  INR 1.45 1.42 1.52* 1.38 2.09*     Imaging: Dg Chest Port 1 View  09/23/2013   CLINICAL DATA:  Ventricular assist device  EXAM: PORTABLE CHEST - 1 VIEW  COMPARISON:  Yesterday  FINDINGS: Right PICC placed. Tip is at the cavoatrial junction. Right internal jugular introducer sheath removed. Ventricular assist device, AICD device, and right internal jugular central venous catheter stable. Mediastinal tube stable. Cardiomegaly. No pneumothorax. Bibasilar atelectasis stable. Stable mild vascular congestion.  IMPRESSION: PICC placed.  Tip is at the cavoatrial junction.  Stable bibasilar atelectasis and vascular congestion.  No pneumothorax.   Electronically Signed   By: Maryclare Bean M.D.   On: 09/23/2013 07:52   Dg Chest Port 1 View  09/22/2013   CLINICAL DATA:  Ventricular assist device  EXAM: PORTABLE CHEST - 1 VIEW  COMPARISON:  Portable chest x-ray dated September 21, 2012  FINDINGS: The lungs are reasonably well inflated. The interstitial markings in the mid and lower lung zones are slightly less conspicuous today. The cardiopericardial silhouette remains enlarged. The pulmonary vascularity is less engorged.  The ventricular assist device appears unchanged in position. The permanent pacemaker defibrillator leads appear stable in position. The right internal jugular venous catheter tip lies at the level of the proximal SVC. The right-sided internal jugular Cordis sheath remains. The Swan-Ganz type catheter has been removed. The right sided chest tube tip lies medially in the mid hemi thorax. The right lower mediastinal drain/ chest tube has been removed. A midline mediastinal drain appears stable in position. The upper left-sided chest tube tip lies in the region of the left pulmonary apex. The lower left chest tube is partially obscured by the ventricular assist device but is likely  unchanged.  IMPRESSION: 1. There has been mild interval improvement in the appearance of the pulmonary interstitium suggesting resolving interstitial edema. There remain findings of low-grade CHF. 2. The support tubes and lines appear stable. The Swan-Ganz catheter and the lower right-sided mediastinal drain/chest tube have been removed.   Electronically Signed   By: David  Martinique   On: 09/22/2013 07:42     Medications:     Scheduled Medications: . acetaminophen  1,000 mg Oral Q6H   Or  . acetaminophen (TYLENOL) oral liquid 160 mg/5 mL  1,000 mg Per Tube Q6H  . amiodarone  400 mg Oral Daily  . antiseptic oral rinse  15 mL Mouth Rinse QID  . aspirin EC  325 mg  Oral Daily   Or  . aspirin  324 mg Per Tube Daily   Or  . aspirin  300 mg Rectal Daily  . budesonide-formoterol  2 puff Inhalation BID  . chlorhexidine  15 mL Mouth/Throat BID  . citalopram  20 mg Oral Daily  . docusate sodium  200 mg Oral Daily  . ezetimibe  10 mg Oral Daily  . ferrous Q000111Q C-folic acid  1 capsule Oral q morning - 10a  . furosemide  40 mg Oral Daily  . insulin aspart  0-15 Units Subcutaneous TID WC  . insulin aspart  0-5 Units Subcutaneous QHS  . levothyroxine  150 mcg Oral Custom   And  . levothyroxine  75 mcg Oral Custom  . pantoprazole  40 mg Oral Q1200  . potassium chloride  10 mEq Intravenous Q1 Hr x 2  . sodium chloride  10-40 mL Intracatheter Q12H  . vancomycin  1,250 mg Intravenous Q24H  . warfarin  2 mg Oral ONCE-1800  . Warfarin - Physician Dosing Inpatient   Does not apply q1800    Infusions: . sodium chloride 10 mL/hr at 09/18/13 2000  . sodium chloride    . milrinone 0.2 mcg/kg/min (09/22/13 1704)    PRN Medications: sodium chloride, ALPRAZolam, hydrALAZINE, menthol-cetylpyridinium, midazolam, morphine injection, ondansetron (ZOFRAN) IV, oxyCODONE, potassium chloride, sodium chloride, sodium chloride, traMADol, zolpidem   Assessment:   1. A/C Systolic Heart Failure  ECHO EF 20-25%  --s/p HM II VAD 09/18/13  2.CAD  3. Acute on chronic renal failure, stage III  4. Acute blood loss anemia, expected - total 4 PRBCs, 1 FFP 5. AKI 6. Persistent fever   Plan/Discussion:    POD #5. Continues to make progress. He remains on low-dose milrinone and oral lasix and amio. Maintaining SR and diuresing well. Renal function improved. VAD parameters stable. Agree with stopping abx. Will decrease milrinone to 0.125.  Continue to ambulate and work with PT.   Continue coumadin per Pharmacy.   I reviewed the LVAD parameters from today, and compared the results to the patient's prior recorded data.  No programming changes were made.  The LVAD is functioning within specified parameters.  The patient performs LVAD self-test daily.  LVAD interrogation was negative for any significant power changes, alarms or PI events/speed drops.  LVAD equipment check completed and is in good working order.  Back-up equipment present.   LVAD education done on emergency procedures and precautions and reviewed exit site care.  Length of Stay: 10  Glori Bickers MD  09/23/2013, 9:42 AM Advanced Heart Failure Team Pager [phone] (M-F; 7a - 4p)  Please contact [facility] for night-coverage after hours (4p -7a ) and weekends on [email or web address]

## 2013-09-24 ENCOUNTER — Inpatient Hospital Stay (HOSPITAL_COMMUNITY): Payer: Medicare Other

## 2013-09-24 LAB — CBC WITH DIFFERENTIAL/PLATELET
Basophils Absolute: 0 10*3/uL (ref 0.0–0.1)
Basophils Relative: 0 % (ref 0–1)
Eosinophils Absolute: 0 10*3/uL (ref 0.0–0.7)
Eosinophils Relative: 0 % (ref 0–5)
HCT: 27.2 % — ABNORMAL LOW (ref 39.0–52.0)
Hemoglobin: 8.9 g/dL — ABNORMAL LOW (ref 13.0–17.0)
Lymphocytes Relative: 3 % — ABNORMAL LOW (ref 12–46)
Lymphs Abs: 0.3 10*3/uL — ABNORMAL LOW (ref 0.7–4.0)
MCH: 27.3 pg (ref 26.0–34.0)
MCHC: 32.7 g/dL (ref 30.0–36.0)
MCV: 83.4 fL (ref 78.0–100.0)
Monocytes Absolute: 0.8 10*3/uL (ref 0.1–1.0)
Monocytes Relative: 7 % (ref 3–12)
Neutro Abs: 9.4 10*3/uL — ABNORMAL HIGH (ref 1.7–7.7)
Neutrophils Relative %: 89 % — ABNORMAL HIGH (ref 43–77)
Platelets: 199 10*3/uL (ref 150–400)
RBC: 3.26 MIL/uL — ABNORMAL LOW (ref 4.22–5.81)
RDW: 18.1 % — ABNORMAL HIGH (ref 11.5–15.5)
WBC: 10.5 10*3/uL (ref 4.0–10.5)

## 2013-09-24 LAB — COMPREHENSIVE METABOLIC PANEL
ALT: 243 U/L — ABNORMAL HIGH (ref 0–53)
AST: 108 U/L — ABNORMAL HIGH (ref 0–37)
Albumin: 2.5 g/dL — ABNORMAL LOW (ref 3.5–5.2)
Alkaline Phosphatase: 132 U/L — ABNORMAL HIGH (ref 39–117)
BUN: 46 mg/dL — ABNORMAL HIGH (ref 6–23)
CO2: 23 mEq/L (ref 19–32)
Calcium: 8.1 mg/dL — ABNORMAL LOW (ref 8.4–10.5)
Chloride: 103 mEq/L (ref 96–112)
Creatinine, Ser: 1.56 mg/dL — ABNORMAL HIGH (ref 0.50–1.35)
GFR calc Af Amer: 50 mL/min — ABNORMAL LOW (ref >90)
GFR calc non Af Amer: 43 mL/min — ABNORMAL LOW (ref >90)
Glucose, Bld: 94 mg/dL (ref 70–99)
Potassium: 3.8 mEq/L (ref 3.7–5.3)
Sodium: 141 mEq/L (ref 137–147)
Total Bilirubin: 1.9 mg/dL — ABNORMAL HIGH (ref 0.3–1.2)
Total Protein: 6.2 g/dL (ref 6.0–8.3)

## 2013-09-24 LAB — GLUCOSE, CAPILLARY
Glucose-Capillary: 89 mg/dL (ref 70–99)
Glucose-Capillary: 111 mg/dL — ABNORMAL HIGH (ref 70–99)
Glucose-Capillary: 109 mg/dL — ABNORMAL HIGH (ref 70–99)

## 2013-09-24 LAB — PROTIME-INR
INR: 1.58 — ABNORMAL HIGH (ref 0.00–1.49)
Prothrombin Time: 18.4 seconds — ABNORMAL HIGH (ref 11.6–15.2)

## 2013-09-24 LAB — CARBOXYHEMOGLOBIN
Carboxyhemoglobin: 2.2 % — ABNORMAL HIGH (ref 0.5–1.5)
Methemoglobin: 1.7 % — ABNORMAL HIGH (ref 0.0–1.5)
O2 Saturation: 61 %
Total hemoglobin: 8.9 g/dL — ABNORMAL LOW (ref 13.5–18.0)

## 2013-09-24 LAB — LACTATE DEHYDROGENASE: LDH: 472 U/L — ABNORMAL HIGH (ref 94–250)

## 2013-09-24 MED ORDER — POTASSIUM CHLORIDE CRYS ER 20 MEQ PO TBCR
40.0000 meq | EXTENDED_RELEASE_TABLET | Freq: Once | ORAL | Status: AC
  Administered 2013-09-24: 40 meq via ORAL
  Filled 2013-09-24: qty 2

## 2013-09-24 MED ORDER — WARFARIN SODIUM 4 MG PO TABS
4.0000 mg | ORAL_TABLET | Freq: Once | ORAL | Status: AC
  Administered 2013-09-24: 4 mg via ORAL
  Filled 2013-09-24: qty 1

## 2013-09-24 MED ORDER — LEVALBUTEROL HCL 0.63 MG/3ML IN NEBU: 0.6300 mg | INHALATION_SOLUTION | Freq: Four times a day (QID) | RESPIRATORY_TRACT | Status: DC | PRN

## 2013-09-24 MED ORDER — HYDRALAZINE HCL 20 MG/ML IJ SOLN
10.0000 mg | INTRAMUSCULAR | Status: DC | PRN
  Administered 2013-09-24 – 2013-09-28 (×6): 10 mg via INTRAVENOUS
  Filled 2013-09-24 (×6): qty 1

## 2013-09-24 MED ORDER — HYDRALAZINE HCL 25 MG PO TABS
12.5000 mg | ORAL_TABLET | Freq: Three times a day (TID) | ORAL | Status: DC
  Administered 2013-09-24 – 2013-09-26 (×7): 12.5 mg via ORAL
  Filled 2013-09-24 (×10): qty 0.5

## 2013-09-24 MED ORDER — LEVOTHYROXINE SODIUM 150 MCG PO TABS
150.0000 ug | ORAL_TABLET | ORAL | Status: DC
  Administered 2013-09-25 – 2013-10-03 (×8): 150 ug via ORAL
  Filled 2013-09-24 (×11): qty 1

## 2013-09-24 MED ORDER — LEVOTHYROXINE SODIUM 75 MCG PO TABS
75.0000 ug | ORAL_TABLET | ORAL | Status: DC
  Administered 2013-09-29: 75 ug via ORAL
  Filled 2013-09-24: qty 1

## 2013-09-24 MED ORDER — POTASSIUM CHLORIDE 10 MEQ/50ML IV SOLN: 10.0000 meq | INTRAVENOUS | Status: DC

## 2013-09-24 MED ORDER — FUROSEMIDE 40 MG PO TABS: 40.0000 mg | ORAL_TABLET | Freq: Two times a day (BID) | ORAL | Status: DC

## 2013-09-24 MED ORDER — HYDRALAZINE HCL 25 MG PO TABS
25.0000 mg | ORAL_TABLET | ORAL | Status: DC | PRN
  Filled 2013-09-24: qty 1

## 2013-09-24 NOTE — Progress Notes (Signed)
Drive line dressing changed per protocol.

## 2013-09-24 NOTE — Progress Notes (Signed)
Patient ID: Johnny Oneill, male   DOB: Jan 25, 1941, 73 y.o.   MRN: [id number] HeartMate 2 Rounding Note  Subjective:    Johnny Oneill is a 73 year old patient with ischemic cardiomyopathy with advanced heart failure on the transplant waiting list at [facility] and supported with home milrinone since April 2014. On this admission he was admitted with acute on chronic heart failure was found to be in cardiogenic shock which responded only to increased dose of milrinone and placement of intra-aortic balloon pump. The patient was discussed at the [facility] conference and felt to be an appropriate candidate for LVAD implantation as a bridge to transplantation. Prior to surgery the details of LVAD implantation were thoroughly discussed with patient and his wife including the expected postop recovery in the long-term postoperative care and followup.   Postop day 6 LVAD implantation  Intraoperative and postoperative course complicated by friable LV myocardium at the apical cannulation site resulting in bleeding from suture tears in the myocardium repaired by multiple extra pledgeted sutures placed before separating from cardio coronary bypass.   He had a stable night but did not sleep much.  No PI events yesterday.  Remains on Milrinone 0.125.  Co-ox 61% MAP >100 this am and received some IV hydralazine. CVP 7 this am  He remains weak. Has only been doing 400 on IS.  Wt 158lbs, decreased 2 lbs from yesterday  LVAD INTERROGATION:  HeartMate II LVAD:  Flow 4.2 liters/min, speed 9200, power 5.3, PI 7.2.    Objective:    Vital Signs:   Temp:  [97.1 F (36.2 C)-98.4 F (36.9 C)] 97.9 F (36.6 C) (01/25 0800) Pulse Rate:  [82-97] 97 (01/25 1000) Resp:  [14-27] 26 (01/25 1000) SpO2:  [90 %-99 %] 96 % (01/25 1000) Arterial Line BP: (114)/(91) 114/91 mmHg (01/24 1100) Weight:  [72.1 kg (158 lb 15.2 oz)] 72.1 kg (158 lb 15.2 oz) (01/25 0600) Last BM Date: 09/17/13 Mean arterial Pressure 90-100,  Max 106 this am  Intake/Output:   Intake/Output Summary (Last 24 hours) at 09/24/13 1017 Last data filed at 09/24/13 0900  Gross per 24 hour  Intake 955.31 ml  Output   1400 ml  Net -444.69 ml     Physical Exam: General:  Looks tired HEENT: normal Neck: supple. Carotids 2+ bilat; no bruits. No lymphadenopathy or thryomegaly appreciated. Cor:  LVAD hum present. Difficult to hear native heart sounds Lungs: Decreased breath sounds in lower lobes with crackles Abdomen: soft, nontender, nondistended. No hepatosplenomegaly. No bruits or masses. Good bowel sounds. Extremities: no cyanosis, clubbing, rash, edema Neuro: alert & orientedx3 but mentation a little slow, cranial nerves grossly intact. moves all 4 extremities w/o difficulty. Affect flat  Telemetry: sinus 93  Labs: Basic Metabolic Panel:  Recent Labs Lab 09/19/13 0350 09/19/13 1556 09/20/13 0430  09/21/13 0404 09/21/13 1600 09/22/13 0435 09/22/13 1400 09/23/13 0430 09/24/13 0400  NA 140 142 141  < > 140 139 141 143 141 141  K 5.0 4.9 5.0  < > 4.0 3.8 3.7 3.9 3.8 3.8  CL 102 104 103  < > 101 100 103 104 103 103  CO2 22 22 20   < > 22 23 24 23 24 23   GLUCOSE 112* 100* 174*  < > 117* 125* 110* 127* 106* 94  BUN 25* 32* 39*  < > 50* 52* 49* 48* 47* 46*  CREATININE 1.47* 1.83* 2.06*  < > 2.02* 1.84* 1.72* 1.56* 1.57* 1.56*  CALCIUM 8.2* 8.9 8.7  < >  8.4 8.6 8.2* 8.1* 8.2* 8.1*  MG 2.7* 2.5 2.7*  --  2.6*  --  2.5  --  2.5  --   PHOS 5.1*  --  5.6*  --  5.3*  --  3.7  --  3.3  --   < > = values in this interval not displayed.  Liver Function Tests:  Recent Labs Lab 09/20/13 0430 09/21/13 0404 09/22/13 0435 09/23/13 0430 09/24/13 0400  AST 417* 528* 266* 151* 108*  ALT 270* 442* 373* 305* 243*  ALKPHOS 82 105 117 121* 132*  BILITOT 5.7* 5.2* 3.2* 2.4* 1.9*  PROT 6.0 6.0 5.9* 6.1 6.2  ALBUMIN 3.0* 2.9* 2.6* 2.5* 2.5*   No results found for this basename: LIPASE, AMYLASE,  in the last 168 hours No results  found for this basename: AMMONIA,  in the last 168 hours  CBC:  Recent Labs Lab 09/20/13 0430  09/21/13 0404 09/21/13 1600 09/22/13 0435 09/23/13 0430 09/23/13 1510 09/24/13 0400  WBC 12.2*  < > 12.3* 12.9* 10.4 9.8 9.2 10.5  NEUTROABS 11.5*  --  11.4*  --  9.4* 8.8*  --  9.4*  HGB 9.1*  < > 8.8* 9.0* 8.7* 8.9* 9.1* 8.9*  HCT 27.0*  < > 26.0* 26.3* 26.0* 27.1* 28.0* 27.2*  MCV 82.6  < > 82.8 81.9 82.5 81.9 82.6 83.4  PLT 140*  < > 125* 132* 141* 158 176 199  < > = values in this interval not displayed.  INR:  Recent Labs Lab 09/20/13 0430 09/21/13 0404 09/22/13 0435 09/23/13 0430 09/24/13 0400  INR 1.42 1.52* 1.38 2.09* 1.58*   LDH 472, down from 516 yesterday  Other results:  EKG:   Imaging: Dg Chest Port 1 View  09/23/2013   CLINICAL DATA:  Ventricular assist device  EXAM: PORTABLE CHEST - 1 VIEW  COMPARISON:  Yesterday  FINDINGS: Right PICC placed. Tip is at the cavoatrial junction. Right internal jugular introducer sheath removed. Ventricular assist device, AICD device, and right internal jugular central venous catheter stable. Mediastinal tube stable. Cardiomegaly. No pneumothorax. Bibasilar atelectasis stable. Stable mild vascular congestion.  IMPRESSION: PICC placed.  Tip is at the cavoatrial junction.  Stable bibasilar atelectasis and vascular congestion.  No pneumothorax.   Electronically Signed   By: Maryclare Bean M.D.   On: 09/23/2013 07:52     CXR today shows bibasilar atelectasis  Medications:     Scheduled Medications: . amiodarone  400 mg Oral Daily  . aspirin EC  325 mg Oral Daily   Or  . aspirin  324 mg Per Tube Daily   Or  . aspirin  300 mg Rectal Daily  . budesonide-formoterol  2 puff Inhalation BID  . citalopram  20 mg Oral Daily  . ezetimibe  10 mg Oral Daily  . ferrous Q000111Q C-folic acid  1 capsule Oral q morning - 10a  . furosemide  40 mg Oral BID  . hydrALAZINE  12.5 mg Oral Q8H  . insulin aspart  0-15 Units Subcutaneous  TID WC  . insulin aspart  0-5 Units Subcutaneous QHS  . levothyroxine  150 mcg Oral Custom   And  . levothyroxine  75 mcg Oral Custom  . pantoprazole  40 mg Oral Q1200  . potassium chloride  40 mEq Oral Once  . sodium chloride  10-40 mL Intracatheter Q12H  . Warfarin - Physician Dosing Inpatient   Does not apply q1800     Infusions: . sodium chloride 20 mL/hr (09/24/13 0237)  .  sodium chloride    . milrinone 0.116 mcg/kg/min (09/23/13 2000)     PRN Medications:  sodium chloride, ALPRAZolam, docusate sodium, hydrALAZINE, menthol-cetylpyridinium, morphine injection, ondansetron (ZOFRAN) IV, oxyCODONE, potassium chloride, sodium chloride, sodium chloride, traMADol, zolpidem   Assessment:   1 acute on chronic systolic heart failure EF 20% requiring implantable LVAD January 19,015  2 ischemic cardiomyopathy  3 perioperative coagulopathy now improved  4 ventilator dependent respiratory insufficiency due to pulmonary edema, possible ARDS from cardio pulmonary bypass-now resolved and patient is extubated   Plan/Discussion:    He has been very stable on Milrinone 0.125. His MAP has been elevated and oral hydralazine started today.  INR dropped from 2 to 1.58. Will give 4 mg coumadin today.  Keep chest tubes in for now.  Foley removed yesterday but pt had difficulty voiding and foley reinserted with removal of a clot. Since he is weak and not very mobile will leave foley in today.  Generally weak and debilitated, sleep deprived. Try to let him sleep at night. Work on [employer] and PT.  I reviewed the LVAD parameters from today, and compared the results to the patient's prior recorded data.  No programming changes were made.  The LVAD is functioning within specified parameters.  The patient performs LVAD self-test daily.  LVAD interrogation was negative for any significant power changes, alarms or PI events/speed drops.  LVAD equipment check completed and is in good working order.  Back-up  equipment present.   LVAD education done on emergency procedures and precautions and reviewed exit site care.  Length of Stay: 11  Daneka Lantigua K 09/24/2013, 10:17 AM

## 2013-09-24 NOTE — Progress Notes (Signed)
Pt noted to be attempting to get OOB x 2 attempts, staff intervention providing support, explanation not to attempt to get up and re-inforced the call bell system. PRN Xanax given at 2035 for report of "nervousness" from Pt. Explanation provided r/t need for sleep with Pt repeating " I know, I know". Pt repositioned back in bed, will cont' to monitor and assess.

## 2013-09-24 NOTE — Progress Notes (Signed)
Patient ID: Johnny Oneill, male   DOB: 12/13/1940, 73 y.o.   MRN: [id number] HeartMate 2 Rounding Note  Subjective:    Johnny Oneill is a 73 year old male with history of CAD s/p stent to LCX 2004 with re-occlusion of LCX with lateral MI 2009 and repeat stenting, total occlusion of RCA, chronic systolic heart failure EF 20% on chronic Milrinone at 0.375 mcg, ICD 2009, PVCs, hypothyroidism, hyperlipidemia, and arthritis. S/P L TKR 2013.   Admitted for VAD work up. Swan placed. IABP placed due to low CO-OX and worsening renal function.   POD #6 LVAD implantation for BTT. LV was friable and soft causing lots of bleeding. Received DDAVP along with multiple blood products.   Doing well. Remains in SR on amio. I/O even yesterday. Remains on low-dose milrinone (decreased to 0.125 yesterday). Other drips off. Tolerating 9200. No PI events. MAPS higher in 90s. No fever.  CVP 15 Co-ox 61%  Cr 1.47>1.83>2.06>2.03>1.72>1.57>1.56 INR 2.1>1.6 WBC 13.8>10.2>12.2>12.3>10.4>9.8>10.5 Hgb 8.1>7.9>9.1>8.8>8.7>8.9 LDH 516>472  LVAD INTERROGATION:  HeartMate II LVAD: Flow 3.9 liters/min, speed 9200 RPM power 5 PI 7.6.  No events over last 24 hrs.    Objective:    Vital Signs:   Temp:  [97.1 F (36.2 C)-98.4 F (36.9 C)] 97.9 F (36.6 C) (01/25 0800) Pulse Rate:  [82-96] 90 (01/25 0700) Resp:  [14-27] 22 (01/25 0700) SpO2:  [90 %-99 %] 96 % (01/25 0700) Arterial Line BP: (101-114)/(76-91) 114/91 mmHg (01/24 1100) Weight:  [158 lb 15.2 oz (72.1 kg)] 158 lb 15.2 oz (72.1 kg) (01/25 0600) Last BM Date: 09/17/13 Mean arterial Pressure 80-90s  Intake/Output:   Intake/Output Summary (Last 24 hours) at 09/24/13 0815 Last data filed at 09/24/13 0700  Gross per 24 hour  Intake 939.31 ml  Output   1280 ml  Net -340.69 ml     Physical Exam: General: Sitting in chair;  No resp difficulty,  HEENT: normal Neck: supple. JVP difficult; RIJ sleeve . Carotids 2+ bilat; no bruits. No lymphadenopathy or  thryomegaly appreciated. Cor: Mechanical heart sounds with LVAD hum present; multiple chest/pleural tubes. Lungs: diminished  Abdomen: soft, nondistended. No hepatosplenomegaly. No bruits or masses. +BS Driveline: C/D/I; securement device intact and driveline secure Extremities: no cyanosis, clubbing, rash, no edema Neuro: alert & orientedx3, cranial nerves grossly intact. moves all 4 extremities w/o difficulty. Affect pleasant  Telemetry: SR 80s  Labs: Basic Metabolic Panel:  Recent Labs Lab 09/19/13 0350 09/19/13 1556 09/20/13 0430  09/21/13 0404 09/21/13 1600 09/22/13 0435 09/22/13 1400 09/23/13 0430 09/24/13 0400  NA 140 142 141  < > 140 139 141 143 141 141  K 5.0 4.9 5.0  < > 4.0 3.8 3.7 3.9 3.8 3.8  CL 102 104 103  < > 101 100 103 104 103 103  CO2 22 22 20   < > 22 23 24 23 24 23   GLUCOSE 112* 100* 174*  < > 117* 125* 110* 127* 106* 94  BUN 25* 32* 39*  < > 50* 52* 49* 48* 47* 46*  CREATININE 1.47* 1.83* 2.06*  < > 2.02* 1.84* 1.72* 1.56* 1.57* 1.56*  CALCIUM 8.2* 8.9 8.7  < > 8.4 8.6 8.2* 8.1* 8.2* 8.1*  MG 2.7* 2.5 2.7*  --  2.6*  --  2.5  --  2.5  --   PHOS 5.1*  --  5.6*  --  5.3*  --  3.7  --  3.3  --   < > = values in this interval not displayed.  Liver Function Tests:  Recent Labs Lab 09/20/13 0430 09/21/13 0404 09/22/13 0435 09/23/13 0430 09/24/13 0400  AST 417* 528* 266* 151* 108*  ALT 270* 442* 373* 305* 243*  ALKPHOS 82 105 117 121* 132*  BILITOT 5.7* 5.2* 3.2* 2.4* 1.9*  PROT 6.0 6.0 5.9* 6.1 6.2  ALBUMIN 3.0* 2.9* 2.6* 2.5* 2.5*   No results found for this basename: LIPASE, AMYLASE,  in the last 168 hours No results found for this basename: AMMONIA,  in the last 168 hours  CBC:  Recent Labs Lab 09/20/13 0430  09/21/13 0404 09/21/13 1600 09/22/13 0435 09/23/13 0430 09/23/13 1510 09/24/13 0400  WBC 12.2*  < > 12.3* 12.9* 10.4 9.8 9.2 10.5  NEUTROABS 11.5*  --  11.4*  --  9.4* 8.8*  --  9.4*  HGB 9.1*  < > 8.8* 9.0* 8.7* 8.9* 9.1*  8.9*  HCT 27.0*  < > 26.0* 26.3* 26.0* 27.1* 28.0* 27.2*  MCV 82.6  < > 82.8 81.9 82.5 81.9 82.6 83.4  PLT 140*  < > 125* 132* 141* 158 176 199  < > = values in this interval not displayed.  INR:  Recent Labs Lab 09/20/13 0430 09/21/13 0404 09/22/13 0435 09/23/13 0430 09/24/13 0400  INR 1.42 1.52* 1.38 2.09* 1.58*     Imaging: Dg Chest Port 1 View  09/23/2013   CLINICAL DATA:  Ventricular assist device  EXAM: PORTABLE CHEST - 1 VIEW  COMPARISON:  Yesterday  FINDINGS: Right PICC placed. Tip is at the cavoatrial junction. Right internal jugular introducer sheath removed. Ventricular assist device, AICD device, and right internal jugular central venous catheter stable. Mediastinal tube stable. Cardiomegaly. No pneumothorax. Bibasilar atelectasis stable. Stable mild vascular congestion.  IMPRESSION: PICC placed.  Tip is at the cavoatrial junction.  Stable bibasilar atelectasis and vascular congestion.  No pneumothorax.   Electronically Signed   By: Maryclare Bean M.D.   On: 09/23/2013 07:52     Medications:     Scheduled Medications: . amiodarone  400 mg Oral Daily  . aspirin EC  325 mg Oral Daily   Or  . aspirin  324 mg Per Tube Daily   Or  . aspirin  300 mg Rectal Daily  . budesonide-formoterol  2 puff Inhalation BID  . citalopram  20 mg Oral Daily  . ezetimibe  10 mg Oral Daily  . ferrous Q000111Q C-folic acid  1 capsule Oral q morning - 10a  . furosemide  40 mg Oral BID  . hydrALAZINE  12.5 mg Oral Q8H  . insulin aspart  0-15 Units Subcutaneous TID WC  . insulin aspart  0-5 Units Subcutaneous QHS  . levothyroxine  150 mcg Oral Custom   And  . levothyroxine  75 mcg Oral Custom  . pantoprazole  40 mg Oral Q1200  . potassium chloride  40 mEq Oral Once  . sodium chloride  10-40 mL Intracatheter Q12H  . Warfarin - Physician Dosing Inpatient   Does not apply q1800    Infusions: . sodium chloride 20 mL/hr (09/24/13 0237)  . sodium chloride    . milrinone 0.116  mcg/kg/min (09/23/13 2000)    PRN Medications: sodium chloride, ALPRAZolam, docusate sodium, hydrALAZINE, hydrALAZINE, menthol-cetylpyridinium, morphine injection, ondansetron (ZOFRAN) IV, oxyCODONE, potassium chloride, sodium chloride, sodium chloride, traMADol, zolpidem   Assessment:   1. 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 Acute on chronic renal failure, stage III  4. Acute blood loss anemia, expected -  total 4 PRBCs, 1 FFP 5. AKI 6. Persistent fever: resolved, off antibiotics.    Plan/Discussion:    POD #6. Continues to make progress. He remains on low-dose milrinone @ 0.125 and oral lasix and amio. Maintaining SR.  Renal function stable. VAD parameters stable.  Co-ox 61% this morning, will leave milrinone at 0.125.  MAPs in 90s, will add low dose hydralazine with prn.  CVP remains 15, will increase Lasix to 40 mg po bid.   Continue to ambulate and work with PT.   Continue coumadin per Pharmacy, will need to be adjusted today.   I reviewed the LVAD parameters from today, and compared the results to the patient's prior recorded data.  No programming changes were made.  The LVAD is functioning within specified parameters.  The patient performs LVAD self-test daily.  LVAD interrogation was negative for any significant power changes, alarms or PI events/speed drops.  LVAD equipment check completed and is in good working order.  Back-up equipment present.   LVAD education done on emergency procedures and precautions and reviewed exit site care.  Length of Stay: 11  Loralie Champagne MD  09/24/2013, 8:15 AM Advanced Heart Failure Team Pager [phone] (M-F; [place])  Please contact [facility] for night-coverage after hours (4p -7a ) and weekends on [email or web address]

## 2013-09-25 ENCOUNTER — Inpatient Hospital Stay (HOSPITAL_COMMUNITY): Payer: Medicare Other

## 2013-09-25 ENCOUNTER — Encounter: Payer: Self-pay | Admitting: Internal Medicine

## 2013-09-25 DIAGNOSIS — R410 Disorientation, unspecified: Secondary | ICD-10-CM

## 2013-09-25 DIAGNOSIS — I5023 Acute on chronic systolic (congestive) heart failure: Secondary | ICD-10-CM

## 2013-09-25 DIAGNOSIS — R404 Transient alteration of awareness: Secondary | ICD-10-CM

## 2013-09-25 DIAGNOSIS — Z95818 Presence of other cardiac implants and grafts: Secondary | ICD-10-CM

## 2013-09-25 LAB — CBC WITH DIFFERENTIAL/PLATELET
Basophils Absolute: 0 10*3/uL (ref 0.0–0.1)
Basophils Relative: 0 % (ref 0–1)
Eosinophils Absolute: 0 10*3/uL (ref 0.0–0.7)
Eosinophils Relative: 0 % (ref 0–5)
HCT: 27.5 % — ABNORMAL LOW (ref 39.0–52.0)
Hemoglobin: 8.7 g/dL — ABNORMAL LOW (ref 13.0–17.0)
Lymphocytes Relative: 4 % — ABNORMAL LOW (ref 12–46)
Lymphs Abs: 0.4 10*3/uL — ABNORMAL LOW (ref 0.7–4.0)
MCH: 27.1 pg (ref 26.0–34.0)
MCHC: 31.6 g/dL (ref 30.0–36.0)
MCV: 85.7 fL (ref 78.0–100.0)
Monocytes Absolute: 0.7 10*3/uL (ref 0.1–1.0)
Monocytes Relative: 7 % (ref 3–12)
Neutro Abs: 9.3 10*3/uL — ABNORMAL HIGH (ref 1.7–7.7)
Neutrophils Relative %: 88 % — ABNORMAL HIGH (ref 43–77)
Platelets: 235 10*3/uL (ref 150–400)
RBC: 3.21 MIL/uL — ABNORMAL LOW (ref 4.22–5.81)
RDW: 18.5 % — ABNORMAL HIGH (ref 11.5–15.5)
WBC: 10.5 10*3/uL (ref 4.0–10.5)

## 2013-09-25 LAB — GLUCOSE, CAPILLARY
Glucose-Capillary: 123 mg/dL — ABNORMAL HIGH (ref 70–99)
Glucose-Capillary: 108 mg/dL — ABNORMAL HIGH (ref 70–99)
Glucose-Capillary: 91 mg/dL (ref 70–99)
Glucose-Capillary: 115 mg/dL — ABNORMAL HIGH (ref 70–99)
Glucose-Capillary: 105 mg/dL — ABNORMAL HIGH (ref 70–99)

## 2013-09-25 LAB — URINE MICROSCOPIC-ADD ON

## 2013-09-25 LAB — COMPREHENSIVE METABOLIC PANEL
ALT: 193 U/L — ABNORMAL HIGH (ref 0–53)
AST: 101 U/L — ABNORMAL HIGH (ref 0–37)
Albumin: 2.5 g/dL — ABNORMAL LOW (ref 3.5–5.2)
Alkaline Phosphatase: 121 U/L — ABNORMAL HIGH (ref 39–117)
BUN: 41 mg/dL — ABNORMAL HIGH (ref 6–23)
CO2: 23 mEq/L (ref 19–32)
Calcium: 8.2 mg/dL — ABNORMAL LOW (ref 8.4–10.5)
Chloride: 106 mEq/L (ref 96–112)
Creatinine, Ser: 1.4 mg/dL — ABNORMAL HIGH (ref 0.50–1.35)
GFR calc Af Amer: 57 mL/min — ABNORMAL LOW (ref >90)
GFR calc non Af Amer: 49 mL/min — ABNORMAL LOW (ref >90)
Glucose, Bld: 100 mg/dL — ABNORMAL HIGH (ref 70–99)
Potassium: 4 mEq/L (ref 3.7–5.3)
Sodium: 143 mEq/L (ref 137–147)
Total Bilirubin: 1.7 mg/dL — ABNORMAL HIGH (ref 0.3–1.2)
Total Protein: 6 g/dL (ref 6.0–8.3)

## 2013-09-25 LAB — URINALYSIS, ROUTINE W REFLEX MICROSCOPIC
Bilirubin Urine: NEGATIVE
Glucose, UA: NEGATIVE mg/dL
Ketones, ur: NEGATIVE mg/dL
Leukocytes, UA: NEGATIVE
Nitrite: NEGATIVE
Protein, ur: 30 mg/dL — AB
Specific Gravity, Urine: 1.023 (ref 1.005–1.030)
Urobilinogen, UA: 0.2 mg/dL (ref 0.0–1.0)
pH: 5.5 (ref 5.0–8.0)

## 2013-09-25 LAB — CARBOXYHEMOGLOBIN
Carboxyhemoglobin: 2.2 % — ABNORMAL HIGH (ref 0.5–1.5)
Methemoglobin: 1.6 % — ABNORMAL HIGH (ref 0.0–1.5)
O2 Saturation: 60 %
Total hemoglobin: 9 g/dL — ABNORMAL LOW (ref 13.5–18.0)

## 2013-09-25 LAB — PRO B NATRIURETIC PEPTIDE: Pro B Natriuretic peptide (BNP): 7019 pg/mL — ABNORMAL HIGH (ref 0–125)

## 2013-09-25 LAB — PROTIME-INR
INR: 1.58 — ABNORMAL HIGH (ref 0.00–1.49)
Prothrombin Time: 18.4 seconds — ABNORMAL HIGH (ref 11.6–15.2)

## 2013-09-25 LAB — LACTATE DEHYDROGENASE: LDH: 458 U/L — ABNORMAL HIGH (ref 94–250)

## 2013-09-25 MED ORDER — OLANZAPINE 2.5 MG PO TABS
2.5000 mg | ORAL_TABLET | ORAL | Status: DC
  Administered 2013-09-25: 2.5 mg via ORAL
  Filled 2013-09-25 (×2): qty 1

## 2013-09-25 MED ORDER — HALOPERIDOL LACTATE 5 MG/ML IJ SOLN
4.0000 mg | INTRAMUSCULAR | Status: DC | PRN
  Administered 2013-09-25: 4 mg via INTRAVENOUS

## 2013-09-25 MED ORDER — MIDAZOLAM HCL 2 MG/2ML IJ SOLN: 1.0000 mg | INTRAMUSCULAR | Status: DC | PRN

## 2013-09-25 MED ORDER — WARFARIN SODIUM 5 MG PO TABS
5.0000 mg | ORAL_TABLET | Freq: Every day | ORAL | Status: DC
  Administered 2013-09-25 – 2013-09-30 (×5): 5 mg via ORAL
  Filled 2013-09-25 (×7): qty 1

## 2013-09-25 MED ORDER — OLANZAPINE 2.5 MG PO TABS
2.5000 mg | ORAL_TABLET | Freq: Every day | ORAL | Status: DC
  Filled 2013-09-25: qty 1

## 2013-09-25 MED ORDER — HALOPERIDOL LACTATE 5 MG/ML IJ SOLN
INTRAMUSCULAR | Status: AC
  Filled 2013-09-25: qty 1

## 2013-09-25 MED ORDER — PRO-STAT SUGAR FREE PO LIQD
30.0000 mL | Freq: Two times a day (BID) | ORAL | Status: DC
  Administered 2013-09-25 – 2013-10-02 (×10): 30 mL via ORAL
  Filled 2013-09-25 (×18): qty 30

## 2013-09-25 MED ORDER — AMIODARONE HCL 200 MG PO TABS
200.0000 mg | ORAL_TABLET | Freq: Every day | ORAL | Status: DC
  Administered 2013-09-26 – 2013-10-03 (×8): 200 mg via ORAL
  Filled 2013-09-25 (×8): qty 1

## 2013-09-25 MED ORDER — ENSURE COMPLETE PO LIQD
237.0000 mL | Freq: Two times a day (BID) | ORAL | Status: DC
  Administered 2013-09-26 – 2013-10-03 (×12): 237 mL via ORAL

## 2013-09-25 NOTE — Progress Notes (Signed)
Attempted to obtain pt map manually and change pt drive line dressing. Pt is refusing to allow me to take his BP or to do the dressing change. Pt very irritable and asking me to leave his room. Will continue to monitor and reassess. Pt vital signs currently WNL (MAP is unknown) pt safety maintained and sitter is currently in the room.

## 2013-09-25 NOTE — Progress Notes (Signed)
Occupational Therapy Treatment Patient Details Name: Johnny Oneill MRN: [id number] DOB: Apr 01, 1941 Today's Date: 09/25/2013 Time: 1002-1026 OT Time Calculation (min): 24 min  OT Assessment / Plan / Recommendation  History of present illness Mr Moresi is a 73 year old male with history of CAD s/p stent to LCX 2004 with re-occlusion of LCX with lateral MI 2009 and repeat stenting, total occlusion of RCA, 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 S/P L TKR 2013. Last Cath 2009..  [name]   OT comments  Pt very confused today.  Attempted to have pt switch from power to battery, but pt unable due to confusion and began to get agitated.   Pt ambulated ~75', attempting to pull at lines.  PI 8.1 at beginning of session 7.8 at end of session.  Vitals stable.  Pt returned to bed and RN notified.   Follow Up Recommendations  No OT follow up;Supervision/Assistance - 24 hour    Barriers to Discharge       Equipment Recommendations  Other (comment) (will need rollator)    Recommendations for Other Services    Frequency Min 3X/week   Progress towards OT Goals Progress towards OT goals: Progressing toward goals  Plan Discharge plan remains appropriate    Precautions / Restrictions Precautions Precautions: Sternal;Other (comment) Precaution Comments: Multiple lines; LVAD equipment Restrictions Weight Bearing Restrictions: Yes (sternal precautions)   Pertinent Vitals/Pain     ADL  Toilet Transfer: Minimal assistance (2) Toilet Transfer Method: Sit to stand;Stand pivot Transfers/Ambulation Related to ADLs: +2 min A. ADL Comments: Pt very confused today.  Max cues for sternal precautions.  Able to check charge on batteries with supervision and able to connect batteries to clip with max cues.  Unable to complete rest of switch over from power supply <> battery due to level of confusion      OT Diagnosis:    OT Problem List:   OT Treatment Interventions:     OT Goals(current goals can now be found in the care plan section) ADL Goals Pt Will Perform Grooming: with min guard assist;standing Pt Will Perform Upper Body Bathing: with supervision;sitting Pt Will Perform Lower Body Bathing: with min assist;sit to/from stand Pt Will Perform Upper Body Dressing: with supervision;sitting Pt Will Perform Lower Body Dressing: with min assist;sit to/from stand Pt Will Transfer to Toilet: with min guard assist;ambulating;bedside commode Pt Will Perform Toileting - Clothing Manipulation and hygiene: with min guard assist;sit to/from stand Additional ADL Goal #1: Pt will be independent with sternal precautions during all BADLs and functional mobility Additional ADL Goal #2: Pt will be independent with managing LVAD equipment   Visit Information  Last OT Received On: 09/25/13 Assistance Needed: +2 PT/OT/SLP Co-Evaluation/Treatment: Yes Reason for Co-Treatment: Complexity of the patient's impairments (multi-system involvement);For patient/therapist safety OT goals addressed during session: Other (comment) (functional mobilty and safety ) History of Present Illness: Mr Fouraker is a 73 year old male with history of CAD s/p stent to LCX 2004 with re-occlusion of LCX with lateral MI 2009 and repeat stenting, total occlusion of RCA, 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 S/P L TKR 2013. Last Cath 2009..  [name]    Subjective Data      Prior Functioning       Cognition  Cognition Arousal/Alertness: Awake/alert Behavior During Therapy: Flat affect Overall  Cognitive Status: Impaired/Different from baseline Area of Impairment: Orientation;Attention;Memory;Following commands;Safety/judgement;Awareness;Problem solving Orientation Level: Place;Time Current Attention Level:  Focused;Sustained (fluctuated) Memory: Decreased recall of precautions;Decreased short-term memory Following Commands: Follows one step commands with increased time (and verbal cues) Safety/Judgement: Decreased awareness of safety;Decreased awareness of deficits Problem Solving: Slow processing;Decreased initiation;Difficulty sequencing;Requires verbal cues;Requires tactile cues General Comments: Pt confused.  Became irritable quickly.  Insists he is going home.  Pt attempting to pull on LVAD lines    Mobility  Bed Mobility Overal bed mobility: Needs Assistance Sit to supine: Total assist;+2 for safety/equipment General bed mobility comments: Pt moved to supine due to unable to maintain safe technique and attempting to pull at lines Transfers Overall transfer level: Needs assistance Equipment used: Pushed w/c Transfers: Sit to/from Stand Sit to Stand: Min assist;+2 safety/equipment Stand pivot transfers: Min assist;+2 safety/equipment General transfer comment: Pt requires +2 (and sitter followed) due to pt confusion and safety concerns    Exercises      Balance Balance Overall balance assessment: Needs assistance Sitting-balance support: Feet supported;No upper extremity supported Sitting balance-Leahy Scale: Good Standing balance support: No upper extremity supported Standing balance-Leahy Scale: Poor Standing balance comment: Min A  End of Session OT - End of Session Equipment Utilized During Treatment: Oxygen;Other (comment) (LVAD equipment) Activity Tolerance: Other (comment) (confusion ) Patient left: in bed;with call bell/phone within reach;with nursing/sitter in room Nurse Communication: Mobility status  Orick, Charmian Forbis M 09/25/2013, 10:36 AM

## 2013-09-25 NOTE — Progress Notes (Signed)
Patient ID: Johnny Oneill, male   DOB: [birth year], 73 y.o.   MRN: [id number] HeartMate 2 Rounding Note  Subjective:    [name] is a 73 year old patient with ischemic cardiomyopathy with advanced heart failure on the transplant waiting list at [facility] and supported with home milrinone since April 2014. On this admission he was admitted with acute on chronic heart failure was found to be in cardiogenic shock which responded only to increased dose of milrinone and placement of intra-aortic balloon pump. The patient was discussed at the [facility] conference and felt to be an appropriate candidate for LVAD implantation as a bridge to transplantation. Prior to surgery the details of LVAD implantation were thoroughly discussed with patient and his wife including the expected postop recovery in the long-term postoperative care and followup.   Postop day 7 VAD implantation  Intraoperative and postoperative course complicated by friable LV myocardium at the apical cannulation site resulting in bleeding from suture tears in the myocardium repaired by multiple extra pledgeted sutures placed before separating from cardio coronary bypass.   He had a stable night but did not sleep much.  the patient's confusion has improved since yesterday  No PI events since yesterday.  Remains on Milrinone 0.125.  Co-ox 61% MAP >100 this am and received some IV hydralazine. CVP 7 this am  He remains weak. Has only been doing 400 on IS. The patient walked 75 feet with physical therapy today  Wt 158lbs, decreased 2 lbs from yesterday  LVAD INTERROGATION:  HeartMate II LVAD:  Flow 4.2 liters/min, speed 900, power 5.3, PI 4 .2.    Objective:    Vital Signs:   Temp:  [97.5 F (36.4 C)-98.4 F (36.9 C)] 97.8 F (36.6 C) (01/26 1534) Pulse Rate:  [72-97] 89 (01/26 1900) Resp:  [13-27] 23 (01/26 1900) SpO2:  [91 %-98 %] 97 % (01/26 1900) Weight:  [158 lb 8.2 oz (71.9 kg)] 158 lb 8.2 oz (71.9 kg) (01/26  0500) Last BM Date: 09/24/13 Mean arterial Pressure 90-100, Max 106 this am  Intake/Output:   Intake/Output Summary (Last 24 hours) at 09/25/13 2035 Last data filed at 09/25/13 1900  Gross per 24 hour  Intake  649.4 ml  Output   1200 ml  Net -550.6 ml     Physical Exam: General:  Looks tired HEENT: normal Neck: supple. Carotids 2+ bilat; no bruits. No lymphadenopathy or thryomegaly appreciated. Cor:  LVAD hum present. Difficult to hear native heart sounds Lungs: Decreased breath sounds in lower lobes with crackles Abdomen: soft, nontender, nondistended. No hepatosplenomegaly. No bruits or masses. Good bowel sounds. Extremities: no cyanosis, clubbing, rash, edema Neuro: alert & orientedx3 but mentation a little slow, cranial nerves grossly intact. moves all 4 extremities w/o difficulty. Affect flat  Telemetry: sinus 93  Labs: Basic Metabolic Panel:  Recent Labs Lab 09/19/13 0350 09/19/13 1556 09/20/13 0430  09/21/13 0404  09/22/13 0435 09/22/13 1400 09/23/13 0430 09/24/13 0400 09/25/13 0400  NA 140 142 141  < > 140  < > 141 143 141 141 143  K 5.0 4.9 5.0  < > 4.0  < > 3.7 3.9 3.8 3.8 4.0  CL 102 104 103  < > 101  < > 103 104 103 103 106  CO2 22 22 20   < > 22  < > 24 23 24 23 23   GLUCOSE 112* 100* 174*  < > 117*  < > 110* 127* 106* 94 100*  BUN 25* 32* 39*  < >  50*  < > 49* 48* 47* 46* 41*  CREATININE 1.47* 1.83* 2.06*  < > 2.02*  < > 1.72* 1.56* 1.57* 1.56* 1.40*  CALCIUM 8.2* 8.9 8.7  < > 8.4  < > 8.2* 8.1* 8.2* 8.1* 8.2*  MG 2.7* 2.5 2.7*  --  2.6*  --  2.5  --  2.5  --   --   PHOS 5.1*  --  5.6*  --  5.3*  --  3.7  --  3.3  --   --   < > = values in this interval not displayed.  Liver Function Tests:  Recent Labs Lab 09/21/13 0404 09/22/13 0435 09/23/13 0430 09/24/13 0400 09/25/13 0400  AST 528* 266* 151* 108* 101*  ALT 442* 373* 305* 243* 193*  ALKPHOS 105 117 121* 132* 121*  BILITOT 5.2* 3.2* 2.4* 1.9* 1.7*  PROT 6.0 5.9* 6.1 6.2 6.0  ALBUMIN 2.9*  2.6* 2.5* 2.5* 2.5*   No results found for this basename: LIPASE, AMYLASE,  in the last 168 hours No results found for this basename: AMMONIA,  in the last 168 hours  CBC:  Recent Labs Lab 09/21/13 0404  09/22/13 0435 09/23/13 0430 09/23/13 1510 09/24/13 0400 09/25/13 0400  WBC 12.3*  < > 10.4 9.8 9.2 10.5 10.5  NEUTROABS 11.4*  --  9.4* 8.8*  --  9.4* 9.3*  HGB 8.8*  < > 8.7* 8.9* 9.1* 8.9* 8.7*  HCT 26.0*  < > 26.0* 27.1* 28.0* 27.2* 27.5*  MCV 82.8  < > 82.5 81.9 82.6 83.4 85.7  PLT 125*  < > 141* 158 176 199 235  < > = values in this interval not displayed.  INR:  Recent Labs Lab 09/21/13 0404 09/22/13 0435 09/23/13 0430 09/24/13 0400 09/25/13 0400  INR 1.52* 1.38 2.09* 1.58* 1.58*   LDH 472, down from 516 yesterday  Other results:  EKG:   Imaging: Dg Chest Port 1 View  09/25/2013   CLINICAL DATA:  Post left ventricular assist device insertion  EXAM: PORTABLE CHEST - 1 VIEW  COMPARISON:  Portable exam [id number] hr compared to 09/24/2013  FINDINGS: Left ventricular assist device again identified.  Left subclavian transvenous AICD leads project over right atrium right ventricle.  Enlargement of cardiac silhouette post median sternotomy.  Right arm PICC line tip projects over SVC.  Mediastinal contours and pulmonary vascularity stable.  Bibasilar atelectasis and question small pleural effusions little changed.  Mild right perihilar infiltrate/edema.  No pneumothorax.  Bones demineralized.  IMPRESSION: Little interval change.   Electronically Signed   By: Lavonia Dana M.D.   On: 09/25/2013 08:25   Dg Chest Port 1 View  09/24/2013   CLINICAL DATA:  Congestive heart failure. On left ventricular assist device.  EXAM: PORTABLE CHEST - 1 VIEW  COMPARISON:  09/23/2013  FINDINGS: Support lines and tubes in appropriate position. No evidence of pneumothorax. Cardiomegaly is stable. Mild diffuse pulmonary edema pattern shows no significant change. Mild bibasilar atelectasis again noted,  without evidence of focal consolidation.  IMPRESSION: No significant change compared with prior exam.   Electronically Signed   By: Earle Gell M.D.   On: 09/24/2013 11:03    CXR today shows bibasilar atelectasis  Medications:     Scheduled Medications: . [START ON 09/26/2013] amiodarone  200 mg Oral Daily  . aspirin EC  325 mg Oral Daily   Or  . aspirin  324 mg Per Tube Daily   Or  . aspirin  300 mg Rectal Daily  .  budesonide-formoterol  2 puff Inhalation BID  . ezetimibe  10 mg Oral Daily  . feeding supplement (ENSURE COMPLETE)  237 mL Oral BID BM  . feeding supplement (PRO-STAT SUGAR FREE 64)  30 mL Oral BID WC  . ferrous Q000111Q C-folic acid  1 capsule Oral q morning - 10a  . hydrALAZINE  12.5 mg Oral Q8H  . insulin aspart  0-15 Units Subcutaneous TID WC  . insulin aspart  0-5 Units Subcutaneous QHS  . levothyroxine  150 mcg Oral Custom   And  . [START ON 09/29/2013] levothyroxine  75 mcg Oral Custom  . OLANZapine  2.5 mg Oral Q24H  . pantoprazole  40 mg Oral Q1200  . sodium chloride  10-40 mL Intracatheter Q12H  . warfarin  5 mg Oral q1800  . Warfarin - Physician Dosing Inpatient   Does not apply q1800    Infusions: . sodium chloride 20 mL/hr (09/24/13 0237)  . sodium chloride      PRN Medications: sodium chloride, docusate sodium, hydrALAZINE, levalbuterol, menthol-cetylpyridinium, midazolam, ondansetron (ZOFRAN) IV, potassium chloride, sodium chloride, sodium chloride, traMADol   Assessment:   1 acute on chronic systolic heart failure EF 20% requiring implantable LVAD January 19,015  2 ischemic cardiomyopathy  3 perioperative coagulopathy now improved  4 ventilator dependent respiratory insufficiency due to pulmonary edema, possible ARDS from cardio pulmonary bypass-now resolved and patient is extubated   Plan/Discussion:    He has been very stable on Milrinone 0.125. His MAP has been elevated and oral hydralazine started today.  INR remains at  1.58. Will give 5 mg coumadin today  We'll remove left pleural tube and leave anterior MT.  Foley removed yesterday but pt had difficulty voiding and foley reinserted with removal of a clot. Since he is weak and not very mobile will leave foley in today.  Generally weak and debilitated, sleep deprived. Try to let him sleep at night. Work on [employer] and PT.  I reviewed the LVAD parameters from today, and compared the results to the patient's prior recorded data.  No programming changes were made.  The LVAD is functioning within specified parameters.  The patient performs LVAD self-test daily.  LVAD interrogation was negative for any significant power changes, alarms or PI events/speed drops.  LVAD equipment check completed and is in good working order.  Back-up equipment present.   LVAD education done on emergency procedures and precautions and reviewed exit site care.  Length of Stay: 12  VAN TRIGT III,Allister Lessley 09/25/2013, 8:35 PM

## 2013-09-25 NOTE — Progress Notes (Signed)
UR Completed.  Johnny Oneill Jane [phone] 08/05/2014  

## 2013-09-25 NOTE — Progress Notes (Signed)
Physical Therapy Treatment Patient Details Name: ISHRAQ WANLESS MRN: [id number] DOB: 14-Dec-1940 Today's Date: 09/25/2013 Time: 1002-1026 PT Time Calculation (min): 24 min  PT Assessment / Plan / Recommendation  History of Present Illness Mr Lemm is a 73 year old male with history of CAD s/p stent to LCX 2004 with re-occlusion of LCX with lateral MI 2009 and repeat stenting, total occlusion of RCA, 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 S/P L TKR 2013. Last Cath 2009..  [name]   PT Comments   Pt too sleep deprived and agitated to complete much education today, but generally on track.  Follow Up Recommendations  Home health PT;Supervision/Assistance - 24 hour;Supervision for mobility/OOB     Does the patient have the potential to tolerate intense rehabilitation     Barriers to Discharge        Equipment Recommendations  Other (comment)    Recommendations for Other Services    Frequency Min 5X/week   Progress towards PT Goals Progress towards PT goals: Progressing toward goals  Plan Current plan remains appropriate    Precautions / Restrictions Precautions Precautions: Sternal;Other (comment) Precaution Comments: Multiple lines; LVAD equipment Restrictions Weight Bearing Restrictions: Yes (sternal precautions) Other Position/Activity Restrictions: Sternal precautions   Pertinent Vitals/Pain Vitals stable and pt's response to treatment good.     Mobility  Bed Mobility Overal bed mobility: Needs Assistance Bed Mobility: Sit to Supine Sit to supine: Total assist;+2 for safety/equipment General bed mobility comments: Pt moved to supine due to unable to maintain safe technique and attempting to pull at lines Transfers Overall transfer level: Needs assistance Equipment used: Pushed w/c Transfers: Sit to/from Stand Sit to Stand: Min assist;+2 safety/equipment Stand  pivot transfers: Min assist;+2 safety/equipment General transfer comment: Pt requires +2 (and sitter followed) due to pt confusion and safety concerns Ambulation/Gait Ambulation/Gait assistance: Mod assist;+2 physical assistance Ambulation Distance (Feet): 75 Feet Assistive device:  (pushing w/c) Gait Pattern/deviations: Step-through pattern;Decreased step length - right;Decreased step length - left;Decreased stride length;Drifts right/left Gait velocity: slower    Exercises     PT Diagnosis:    PT Problem List:   PT Treatment Interventions:     PT Goals (current goals can now be found in the care plan section) Acute Rehab PT Goals PT Goal Formulation: With patient Time For Goal Achievement: 10/04/13 Potential to Achieve Goals: Good  Visit Information  Last PT Received On: 09/25/13 Assistance Needed: +2 PT/OT/SLP Co-Evaluation/Treatment: Yes Reason for Co-Treatment: Complexity of the patient's impairments (multi-system involvement) PT goals addressed during session: Mobility/safety with mobility OT goals addressed during session: Other (comment) (functional mobilty and safety ) History of Present Illness: Mr Stoltzfoos is a 73 year old male with history of CAD s/p stent to LCX 2004 with re-occlusion of LCX with lateral MI 2009 and repeat stenting, total occlusion of RCA, 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 S/P L TKR 2013. Last Cath 2009..  [name]    Subjective Data  Subjective: Horris Latino has showed me how, I promise, I kon't need to do this.   Cognition  Cognition Arousal/Alertness: Awake/alert Behavior During Therapy: Flat affect Overall Cognitive Status: Impaired/Different from baseline Area of Impairment: Orientation;Attention;Memory;Following commands;Safety/judgement;Awareness;Problem solving Orientation Level: Place;Time Current Attention Level: Focused;Sustained  (fluctuated) Memory: Decreased recall of precautions;Decreased short-term memory Following Commands: Follows one step commands with increased time (  and verbal cues) Safety/Judgement: Decreased awareness of safety;Decreased awareness of deficits Problem Solving: Slow processing;Decreased initiation;Difficulty sequencing;Requires verbal cues;Requires tactile cues General Comments: Pt confused.  Became irritable quickly.  Insists he is going home.  Pt attempting to pull on LVAD lines    Balance  Balance Overall balance assessment: Needs assistance Sitting-balance support: Feet supported;No upper extremity supported Sitting balance-Leahy Scale: Good Sitting balance - Comments: Pt with posterior leand and flexed trunk and head/neck.  Requires verbal cues for erect posture Standing balance support: No upper extremity supported Standing balance-Leahy Scale: Poor Standing balance comment: min a  End of Session PT - End of Session Equipment Utilized During Treatment: Oxygen Activity Tolerance: Patient tolerated treatment well;Treatment limited secondary to agitation Patient left: in bed;with call bell/phone within reach;with nursing/sitter in room Nurse Communication: Mobility status   GP     Marin Wisner, Tessie Fass 09/25/2013, 10:54 AM 09/25/2013  Donnella Sham, [place] [phone]  (pager)

## 2013-09-25 NOTE — Progress Notes (Signed)
NUTRITION FOLLOW UP  Intervention:   Ensure Complete po BID, each supplement provides 350 kcal and 13 grams of protein Prostat liquid protein po 30 ml BID, each supplement provides 100 kcal, 15 grams protein RD to follow for nutrition care plan  Nutrition Dx:   Inadequate oral intake now related to poor appetite as evidenced by PO intake 10-15%, ongoing  Goal:   Pt to meet >/= 90% of their estimated nutrition needs, progressing  Monitor:   PO & supplemental intake, weight, labs, I/O's  Assessment:   Patient with PMH of CAD, chronic systolic heart failure, ICD 2009, PVCs, hypothyroidism, hyperlipidemia, and arthritis; admitted for ICM and LVAD work up.   Patient s/p procedure 1/19:  HeartMate II LVAD IMPLANTATION   Patient extubated 1/21.  Advanced to  Clear Liquids 1/21, Heart Healthy diet 1/24.  Patient reports he's eating well, however, PO intake per flowsheet records poor at 10-15%.    HeartMate 2 Rounding Notes reviewed.  Patient POD #7.  Noted LVAD was friable and soft causing lots of bleeding.  Patient received DDAVP along with multiple blood products.  Patient remains weak.  Height: Ht Readings from Last 1 Encounters:  09/17/13 5\' 7"  (1.702 m)    Weight Status ---> trending down, + diuresis  Wt Readings from Last 1 Encounters:  09/25/13 158 lb 8.2 oz (71.9 kg)    01/25  158 lb 01/24  160 lb 01/23  162 lb 01/22  167 lb 01/21  171 lb 01/20  174 lb 01/19  145 lb 01/18  156 lb 01/17  160 lb  Re-estimated needs:  Kcal: 2000-2200 Protein: 110-120 gm Fluid: 2.0-2.2 L  Skin: sternal surgical incision   Diet Order: Cardiac   Intake/Output Summary (Last 24 hours) at 09/25/13 1014 Last data filed at 09/25/13 0900  Gross per 24 hour  Intake 1042.1 ml  Output   1000 ml  Net   42.1 ml    Labs:   Recent Labs Lab 09/21/13 0404  09/22/13 0435  09/23/13 0430 09/24/13 0400 09/25/13 0400  NA 140  < > 141  < > 141 141 143  K 4.0  < > 3.7  < > 3.8 3.8 4.0   CL 101  < > 103  < > 103 103 106  CO2 22  < > 24  < > 24 23 23   BUN 50*  < > 49*  < > 47* 46* 41*  CREATININE 2.02*  < > 1.72*  < > 1.57* 1.56* 1.40*  CALCIUM 8.4  < > 8.2*  < > 8.2* 8.1* 8.2*  MG 2.6*  --  2.5  --  2.5  --   --   PHOS 5.3*  --  3.7  --  3.3  --   --   GLUCOSE 117*  < > 110*  < > 106* 94 100*  < > = values in this interval not displayed.  CBG (last 3)   Recent Labs  09/24/13 1226 09/24/13 1811 09/24/13 2143  GLUCAP 111* 109* 123*    Scheduled Meds: . amiodarone  400 mg Oral Daily  . aspirin EC  325 mg Oral Daily   Or  . aspirin  324 mg Per Tube Daily   Or  . aspirin  300 mg Rectal Daily  . budesonide-formoterol  2 puff Inhalation BID  . ezetimibe  10 mg Oral Daily  . ferrous Q000111Q C-folic acid  1 capsule Oral q morning - 10a  . hydrALAZINE  12.5  mg Oral Q8H  . insulin aspart  0-15 Units Subcutaneous TID WC  . insulin aspart  0-5 Units Subcutaneous QHS  . levothyroxine  150 mcg Oral Custom   And  . [START ON 09/29/2013] levothyroxine  75 mcg Oral Custom  . pantoprazole  40 mg Oral Q1200  . sodium chloride  10-40 mL Intracatheter Q12H  . warfarin  5 mg Oral q1800  . Warfarin - Physician Dosing Inpatient   Does not apply q1800    Continuous Infusions: . sodium chloride 20 mL/hr (09/24/13 0237)  . sodium chloride    . milrinone 0.125 mcg/kg/min (09/24/13 1954)    Arthur Holms, RD, LDN Pager #: [phone] After-Hours Pager #: [phone]

## 2013-09-25 NOTE — Progress Notes (Signed)
Pt EKG monitor alarming disconnected, further inspection revealed Pt sitting on side of bed, O2 off, EKG leads disconnected. Inquiry made to intentions of getting OOB, Pt stated "to go home", staff re-inforced need to use call bell for assistance. Pt A&O x 4, even recalling wife's birthday. Pt assisted to stand at bedside, weighed and placed in recliner chair. Inspection done to make sure LVAD driveline and MT's remain intact without issue noted. Will cont' to monitor and assess.

## 2013-09-25 NOTE — Progress Notes (Signed)
Patient ID: Johnny Oneill, male   DOB: [birth year], 73 y.o.   MRN: [id number] HeartMate 2 Rounding Note  Subjective:    Johnny Oneill is a 73 year old patient with ischemic cardiomyopathy with advanced heart failure on the transplant waiting list at [facility] and supported with home milrinone since April 2014. On this admission he was admitted with acute on chronic heart failure was found to be in cardiogenic shock which responded only to increased dose of milrinone and placement of intra-aortic balloon pump. The patient was discussed at the [facility] conference and felt to be an appropriate candidate for LVAD implantation as a bridge to transplantation. Prior to surgery the details of LVAD implantation were thoroughly discussed with patient and his wife including the expected postop recovery in the long-term postoperative care and followup.   Postop day 7 LVAD implantation  Intraoperative and postoperative course complicated by friable LV myocardium at the apical cannulation site resulting in bleeding from suture tears in the myocardium repaired by multiple extra pledgeted sutures placed before separating from cardio coronary bypass.   Doing well from cardiac standpoint but has becoming quite delirious now requiring a sitter. Did not sleep much last night. Weight stable. Denies dyspnea. Maintaining SR.   Remains on Milrinone 0.125.  Co-ox 60% MAP >100 this am and received some IV hydralazine. CVP 7 this am   LVAD INTERROGATION:  HeartMate II LVAD:  Flow 4.2 liters/min, speed 9200, power 5.3, PI 7.2.  No PI events  Objective:    Vital Signs:   Temp:  [97.5 F (36.4 C)-98.4 F (36.9 C)] 97.8 F (36.6 C) (01/26 1534) Pulse Rate:  [72-97] 87 (01/26 1700) Resp:  [13-27] 19 (01/26 1700) SpO2:  [91 %-98 %] 97 % (01/26 1700) Weight:  [71.9 kg (158 lb 8.2 oz)] 71.9 kg (158 lb 8.2 oz) (01/26 0500) Last BM Date: 09/24/13 Mean arterial Pressure 90-100, Max 106 this  am  Intake/Output:   Intake/Output Summary (Last 24 hours) at 09/25/13 1811 Last data filed at 09/25/13 1700  Gross per 24 hour  Intake  792.1 ml  Output   1235 ml  Net -442.9 ml     Physical Exam: General: Confused. Nonfocal HEENT: normal Neck: supple. Carotids 2+ bilat; no bruits. No lymphadenopathy or thryomegaly appreciated. Cor:  LVAD hum present. Difficult to hear native heart sounds Lungs: Decreased breath sounds in lower lobes with crackles Abdomen: soft, nontender, nondistended. No hepatosplenomegaly. No bruits or masses. Good bowel sounds. Extremities: no cyanosis, clubbing, rash, edema Neuro: delirious. Non-focal.   Telemetry: sinus 90s  Labs: Basic Metabolic Panel:  Recent Labs Lab 09/19/13 0350 09/19/13 1556 09/20/13 0430  09/21/13 0404  09/22/13 0435 09/22/13 1400 09/23/13 0430 09/24/13 0400 09/25/13 0400  NA 140 142 141  < > 140  < > 141 143 141 141 143  K 5.0 4.9 5.0  < > 4.0  < > 3.7 3.9 3.8 3.8 4.0  CL 102 104 103  < > 101  < > 103 104 103 103 106  CO2 22 22 20   < > 22  < > 24 23 24 23 23   GLUCOSE 112* 100* 174*  < > 117*  < > 110* 127* 106* 94 100*  BUN 25* 32* 39*  < > 50*  < > 49* 48* 47* 46* 41*  CREATININE 1.47* 1.83* 2.06*  < > 2.02*  < > 1.72* 1.56* 1.57* 1.56* 1.40*  CALCIUM 8.2* 8.9 8.7  < > 8.4  < > 8.2* 8.1* 8.2*  8.1* 8.2*  MG 2.7* 2.5 2.7*  --  2.6*  --  2.5  --  2.5  --   --   PHOS 5.1*  --  5.6*  --  5.3*  --  3.7  --  3.3  --   --   < > = values in this interval not displayed.  Liver Function Tests:  Recent Labs Lab 09/21/13 0404 09/22/13 0435 09/23/13 0430 09/24/13 0400 09/25/13 0400  AST 528* 266* 151* 108* 101*  ALT 442* 373* 305* 243* 193*  ALKPHOS 105 117 121* 132* 121*  BILITOT 5.2* 3.2* 2.4* 1.9* 1.7*  PROT 6.0 5.9* 6.1 6.2 6.0  ALBUMIN 2.9* 2.6* 2.5* 2.5* 2.5*   No results found for this basename: LIPASE, AMYLASE,  in the last 168 hours No results found for this basename: AMMONIA,  in the last 168  hours  CBC:  Recent Labs Lab 09/21/13 0404  09/22/13 0435 09/23/13 0430 09/23/13 1510 09/24/13 0400 09/25/13 0400  WBC 12.3*  < > 10.4 9.8 9.2 10.5 10.5  NEUTROABS 11.4*  --  9.4* 8.8*  --  9.4* 9.3*  HGB 8.8*  < > 8.7* 8.9* 9.1* 8.9* 8.7*  HCT 26.0*  < > 26.0* 27.1* 28.0* 27.2* 27.5*  MCV 82.8  < > 82.5 81.9 82.6 83.4 85.7  PLT 125*  < > 141* 158 176 199 235  < > = values in this interval not displayed.  INR:  Recent Labs Lab 09/21/13 0404 09/22/13 0435 09/23/13 0430 09/24/13 0400 09/25/13 0400  INR 1.52* 1.38 2.09* 1.58* 1.58*   LDH 472, down from 516 yesterday  Other results:  EKG:   Imaging: Dg Chest Port 1 View  09/25/2013   CLINICAL DATA:  Post left ventricular assist device insertion  EXAM: PORTABLE CHEST - 1 VIEW  COMPARISON:  Portable exam [id number] hr compared to 09/24/2013  FINDINGS: Left ventricular assist device again identified.  Left subclavian transvenous AICD leads project over right atrium right ventricle.  Enlargement of cardiac silhouette post median sternotomy.  Right arm PICC line tip projects over SVC.  Mediastinal contours and pulmonary vascularity stable.  Bibasilar atelectasis and question small pleural effusions little changed.  Mild right perihilar infiltrate/edema.  No pneumothorax.  Bones demineralized.  IMPRESSION: Little interval change.   Electronically Signed   By: Lavonia Dana M.D.   On: 09/25/2013 08:25   Dg Chest Port 1 View  09/24/2013   CLINICAL DATA:  Congestive heart failure. On left ventricular assist device.  EXAM: PORTABLE CHEST - 1 VIEW  COMPARISON:  09/23/2013  FINDINGS: Support lines and tubes in appropriate position. No evidence of pneumothorax. Cardiomegaly is stable. Mild diffuse pulmonary edema pattern shows no significant change. Mild bibasilar atelectasis again noted, without evidence of focal consolidation.  IMPRESSION: No significant change compared with prior exam.   Electronically Signed   By: Earle Gell M.D.   On:  09/24/2013 11:03    CXR today shows bibasilar atelectasis  Medications:     Scheduled Medications: . amiodarone  400 mg Oral Daily  . aspirin EC  325 mg Oral Daily   Or  . aspirin  324 mg Per Tube Daily   Or  . aspirin  300 mg Rectal Daily  . budesonide-formoterol  2 puff Inhalation BID  . ezetimibe  10 mg Oral Daily  . feeding supplement (ENSURE COMPLETE)  237 mL Oral BID BM  . feeding supplement (PRO-STAT SUGAR FREE 64)  30 mL Oral BID WC  .  ferrous Q000111Q C-folic acid  1 capsule Oral q morning - 10a  . hydrALAZINE  12.5 mg Oral Q8H  . insulin aspart  0-15 Units Subcutaneous TID WC  . insulin aspart  0-5 Units Subcutaneous QHS  . levothyroxine  150 mcg Oral Custom   And  . [START ON 09/29/2013] levothyroxine  75 mcg Oral Custom  . OLANZapine  2.5 mg Oral Q24H  . pantoprazole  40 mg Oral Q1200  . sodium chloride  10-40 mL Intracatheter Q12H  . warfarin  5 mg Oral q1800  . Warfarin - Physician Dosing Inpatient   Does not apply q1800    Infusions: . sodium chloride 20 mL/hr (09/24/13 0237)  . sodium chloride    . milrinone 0.125 mcg/kg/min (09/24/13 1954)    PRN Medications: sodium chloride, docusate sodium, hydrALAZINE, levalbuterol, menthol-cetylpyridinium, midazolam, ondansetron (ZOFRAN) IV, potassium chloride, sodium chloride, sodium chloride, traMADol   Assessment:   1. 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 Acute on chronic renal failure, stage III - improved 4. Acute blood loss anemia, expected  - total 4 PRBCs, 1 FFP  5. Persistent fever: resolved, off antibiotics.  6. Acute delirium   Plan/Discussion:    POD #7. Stable from cardiac standpoint.  Main issue is delirium.   Will stop milrinone. Decrease amio to 200 daily. Continue other meds. Will start zyprexa to help with delirium and facilitate sleep.   Continue to ambulate and work with PT.   Continue coumadin per Pharmacy.  I reviewed the LVAD  parameters from today, and compared the results to the patient's prior recorded data. No programming changes were made. The LVAD is functioning within specified parameters. The patient performs LVAD self-test daily. LVAD interrogation was negative for any significant power changes, alarms or PI events/speed drops. LVAD equipment check completed and is in good working order. Back-up equipment present. LVAD education done on emergency procedures and precautions and reviewed exit site care.   Length of Stay: [place] 09/25/2013, 6:11 PM

## 2013-09-26 ENCOUNTER — Inpatient Hospital Stay (HOSPITAL_COMMUNITY): Payer: Medicare Other

## 2013-09-26 DIAGNOSIS — Z95818 Presence of other cardiac implants and grafts: Secondary | ICD-10-CM

## 2013-09-26 DIAGNOSIS — I5023 Acute on chronic systolic (congestive) heart failure: Secondary | ICD-10-CM

## 2013-09-26 LAB — COMPREHENSIVE METABOLIC PANEL
ALT: 147 U/L — ABNORMAL HIGH (ref 0–53)
AST: 78 U/L — ABNORMAL HIGH (ref 0–37)
Albumin: 2.5 g/dL — ABNORMAL LOW (ref 3.5–5.2)
Alkaline Phosphatase: 112 U/L (ref 39–117)
BUN: 35 mg/dL — ABNORMAL HIGH (ref 6–23)
CO2: 24 mEq/L (ref 19–32)
Calcium: 8.2 mg/dL — ABNORMAL LOW (ref 8.4–10.5)
Chloride: 109 mEq/L (ref 96–112)
Creatinine, Ser: 1.35 mg/dL (ref 0.50–1.35)
GFR calc Af Amer: 60 mL/min — ABNORMAL LOW (ref >90)
GFR calc non Af Amer: 52 mL/min — ABNORMAL LOW (ref >90)
Glucose, Bld: 107 mg/dL — ABNORMAL HIGH (ref 70–99)
Potassium: 4 mEq/L (ref 3.7–5.3)
Sodium: 146 mEq/L (ref 137–147)
Total Bilirubin: 1.4 mg/dL — ABNORMAL HIGH (ref 0.3–1.2)
Total Protein: 6 g/dL (ref 6.0–8.3)

## 2013-09-26 LAB — CARBOXYHEMOGLOBIN
Carboxyhemoglobin: 2 % — ABNORMAL HIGH (ref 0.5–1.5)
Carboxyhemoglobin: 2.1 % — ABNORMAL HIGH (ref 0.5–1.5)
Methemoglobin: 1.6 % — ABNORMAL HIGH (ref 0.0–1.5)
Methemoglobin: 1.7 % — ABNORMAL HIGH (ref 0.0–1.5)
O2 Saturation: 51.4 %
O2 Saturation: 53 %
Total hemoglobin: 8.9 g/dL — ABNORMAL LOW (ref 13.5–18.0)
Total hemoglobin: 9.5 g/dL — ABNORMAL LOW (ref 13.5–18.0)

## 2013-09-26 LAB — URINE CULTURE
Colony Count: NO GROWTH
Culture: NO GROWTH

## 2013-09-26 LAB — GLUCOSE, CAPILLARY
Glucose-Capillary: 90 mg/dL (ref 70–99)
Glucose-Capillary: 102 mg/dL — ABNORMAL HIGH (ref 70–99)
Glucose-Capillary: 123 mg/dL — ABNORMAL HIGH (ref 70–99)

## 2013-09-26 LAB — PROTIME-INR
INR: 2.08 — ABNORMAL HIGH (ref 0.00–1.49)
Prothrombin Time: 22.7 seconds — ABNORMAL HIGH (ref 11.6–15.2)

## 2013-09-26 LAB — LACTATE DEHYDROGENASE: LDH: 442 U/L — ABNORMAL HIGH (ref 94–250)

## 2013-09-26 MED ORDER — HYDRALAZINE HCL 25 MG PO TABS
12.5000 mg | ORAL_TABLET | Freq: Once | ORAL | Status: AC
  Administered 2013-09-26: 12.5 mg via ORAL
  Filled 2013-09-26: qty 0.5

## 2013-09-26 MED ORDER — DIPHENHYDRAMINE HCL 50 MG/ML IJ SOLN
25.0000 mg | Freq: Every day | INTRAMUSCULAR | Status: DC
  Administered 2013-09-26: 25 mg via INTRAVENOUS
  Filled 2013-09-26: qty 1
  Filled 2013-09-26: qty 0.5

## 2013-09-26 MED ORDER — AMLODIPINE BESYLATE 5 MG PO TABS
5.0000 mg | ORAL_TABLET | Freq: Every day | ORAL | Status: DC
  Administered 2013-09-26 – 2013-09-27 (×2): 5 mg via ORAL
  Filled 2013-09-26 (×3): qty 1

## 2013-09-26 MED ORDER — MILRINONE IN DEXTROSE 20 MG/100ML IV SOLN
0.0625 ug/kg/min | INTRAVENOUS | Status: DC
Start: 2013-09-26 — End: 2013-09-29
  Administered 2013-09-26 – 2013-09-27 (×2): 0.125 ug/kg/min via INTRAVENOUS
  Filled 2013-09-26 (×2): qty 100

## 2013-09-26 MED ORDER — HYDRALAZINE HCL 25 MG PO TABS
25.0000 mg | ORAL_TABLET | Freq: Three times a day (TID) | ORAL | Status: DC
  Filled 2013-09-26 (×3): qty 1

## 2013-09-26 MED ORDER — OLANZAPINE 5 MG PO TABS
5.0000 mg | ORAL_TABLET | ORAL | Status: DC
  Administered 2013-09-26 – 2013-09-29 (×4): 5 mg via ORAL
  Filled 2013-09-26 (×5): qty 1

## 2013-09-26 NOTE — Progress Notes (Signed)
CSW met with patient and wife at bedside. Patient was sitting in chair and stated he is feeling better. Patient reports that he walked around the unit this morning and feeling accomplished. Wife reports patient was a bit confused but seems to be improving and will be transferred to floor today. CSW provided support and will continue to follow for support throughout recovery. Raquel Sarna, [place]

## 2013-09-26 NOTE — Progress Notes (Signed)
Johnny Oneill has been discussed with the VAD Medical Review board on 09/11/13. The team feels as if the patient is a good candidate for BTT LVAD therapy; pt is currently listed status 1B at [facility]. The patient meets criteria for a LVAD implant as listed below:  1)  Have failed to respond to optimal medical management (including beta-blockers, and            ACE inhibitors if tolerated) for at least 45 of the last 60 days, or have been balloon            dependent for 7 days, or IV inotrope dependent for 14 days; and,       On Inotropes Milrinone 0.375 mcg/kg/min started 12/08/12.  2)  Have a left ventricular ejection fraction (LVEF) < 25%; and, have demonstrated       functional limitation with a peak oxygen consumption of <14 ml/kg/min unless balloon            pump or inotrope dependent or physically unable to perform the test.         EF 25 - 30%  By echo 09/14/13             3)  Social work and palliative care evaluations demonstrate appropriate support system                in place for discharge to home with a VAD and that end of life discussions have taken place.         Social work consult:  pending 09/15/13 Raquel Sarna, LCSW        Palliative Care Consult : 09/14/13 [name], NP  4)  Primary caretaker identified that can be taught along with the patient how to manage        the VAD equipment.       Name:    [name], wife  5)  Deemed appropriate by our Museum/gallery curator.        Prior approval:  N/A  6)  VAD Coordinator has met with patient and caregiver and shown them the actual VAD       equipment. Along with showing them the equipment the VAD Coordinator has                 discussed with the patient and caregiver about lifestyle changes and the consent                    Form.        Met with wife and patient on 09/14/13.  7)  Patient has been discussed with [facility] (Dr.Mishkin) and felt to be an appropriate candidate.    8)  Intermacs profile:   2 with modifier TCS - temporary circulatory support  9)  NYHA Class:  IV  Haroldine Redler,MD 9:19 AM

## 2013-09-26 NOTE — Progress Notes (Signed)
Chaplain offered emotional and spiritual support to pt and pt's wife. Pt's wife said they are waiting to transfer to another room. Pt was eating lunch and said "we have a pastor." They had no immediate needs. Chaplain provided presence and hospitality. They appreciated visit.   [place] [facility]: [phone]

## 2013-09-26 NOTE — Progress Notes (Signed)
Patient ID: Johnny Oneill, male   DOB: Feb 26, 1941, 73 y.o.   MRN: [id number] HeartMate 2 Rounding Note  Subjective:    [name] is a 73 year old patient with ischemic cardiomyopathy with advanced heart failure on the transplant waiting list at [facility] and supported with home milrinone since April 2014. On this admission he was admitted with acute on chronic heart failure was found to be in cardiogenic shock which responded only to increased dose of milrinone and placement of intra-aortic balloon pump. The patient was discussed at the [facility] conference and felt to be an appropriate candidate for LVAD implantation as a bridge to transplantation. Prior to surgery the details of LVAD implantation were thoroughly discussed with patient and his wife including the expected postop recovery in the long-term postoperative care and followup.   Postop day 8 LVAD implantation  Intraoperative and postoperative course complicated by friable LV myocardium at the apical cannulation site resulting in bleeding from suture tears in the myocardium repaired by multiple extra pledgeted sutures placed before separating from cardio coronary bypass.   Doing well from cardiac standpoint but has becoming quite delirious now requiring a sitter. Slept 3 hours last night after Zyprexa and one prn Haldol 4 mg. Weight stable. Denies dyspnea. Maintaining SR. Oriented this am but very flat affect.   Milrinone 0.125 off yesterday at 6pm.   Co-ox 60%>51% MAP 90's on hydralazine 12.5 q 8 hours   LVAD INTERROGATION:  HeartMate II LVAD:  Flow 4.5 liters/min, speed 9000, power 7.8, PI 7.8.  No PI events or alarms  Objective:    Vital Signs:   Temp:  [97.5 F (36.4 C)-98.3 F (36.8 C)] 97.5 F (36.4 C) (01/27 0800) Pulse Rate:  [72-92] 85 (01/27 0800) Resp:  [13-27] 27 (01/27 0800) SpO2:  [92 %-99 %] 98 % (01/27 0817) Weight:  [70.58 kg (155 lb 9.6 oz)] 70.58 kg (155 lb 9.6 oz) (01/27 0437) Last BM Date:  09/25/13 Mean arterial Pressure 90-100, Max 106 this am  Intake/Output:   Intake/Output Summary (Last 24 hours) at 09/26/13 0913 Last data filed at 09/26/13 0800  Gross per 24 hour  Intake 505.51 ml  Output   1240 ml  Net -734.49 ml     Physical Exam: General: sitting in chair. Stares blankly Nonfocal HEENT: normal Neck: JVP 9-10 supple. Carotids 2+ bilat; no bruits. No lymphadenopathy or thryomegaly appreciated. Cor:  LVAD hum present. Difficult to hear native heart sounds Lungs: Decreased breath sounds in lower lobes o/w clear Abdomen: soft, nontender, nondistended. No hepatosplenomegaly. No bruits or masses. Good bowel sounds. Extremities: no cyanosis, clubbing, rash, edema Neuro: delirious. Non-focal.   Telemetry: sinus 80-90s  Labs: Basic Metabolic Panel:  Recent Labs Lab 09/19/13 1556 09/20/13 0430  09/21/13 0404  09/22/13 0435 09/22/13 1400 09/23/13 0430 09/24/13 0400 09/25/13 0400 09/26/13 0400  NA 142 141  < > 140  < > 141 143 141 141 143 146  K 4.9 5.0  < > 4.0  < > 3.7 3.9 3.8 3.8 4.0 4.0  CL 104 103  < > 101  < > 103 104 103 103 106 109  CO2 22 20  < > 22  < > 24 23 24 23 23 24   GLUCOSE 100* 174*  < > 117*  < > 110* 127* 106* 94 100* 107*  BUN 32* 39*  < > 50*  < > 49* 48* 47* 46* 41* 35*  CREATININE 1.83* 2.06*  < > 2.02*  < > 1.72* 1.56*  1.57* 1.56* 1.40* 1.35  CALCIUM 8.9 8.7  < > 8.4  < > 8.2* 8.1* 8.2* 8.1* 8.2* 8.2*  MG 2.5 2.7*  --  2.6*  --  2.5  --  2.5  --   --   --   PHOS  --  5.6*  --  5.3*  --  3.7  --  3.3  --   --   --   < > = values in this interval not displayed.  Liver Function Tests:  Recent Labs Lab 09/22/13 0435 09/23/13 0430 09/24/13 0400 09/25/13 0400 09/26/13 0400  AST 266* 151* 108* 101* 78*  ALT 373* 305* 243* 193* 147*  ALKPHOS 117 121* 132* 121* 112  BILITOT 3.2* 2.4* 1.9* 1.7* 1.4*  PROT 5.9* 6.1 6.2 6.0 6.0  ALBUMIN 2.6* 2.5* 2.5* 2.5* 2.5*   No results found for this basename: LIPASE, AMYLASE,  in the last  168 hours No results found for this basename: AMMONIA,  in the last 168 hours  CBC:  Recent Labs Lab 09/21/13 0404  09/22/13 0435 09/23/13 0430 09/23/13 1510 09/24/13 0400 09/25/13 0400  WBC 12.3*  < > 10.4 9.8 9.2 10.5 10.5  NEUTROABS 11.4*  --  9.4* 8.8*  --  9.4* 9.3*  HGB 8.8*  < > 8.7* 8.9* 9.1* 8.9* 8.7*  HCT 26.0*  < > 26.0* 27.1* 28.0* 27.2* 27.5*  MCV 82.8  < > 82.5 81.9 82.6 83.4 85.7  PLT 125*  < > 141* 158 176 199 235  < > = values in this interval not displayed.  INR:  2.08  Recent Labs Lab 09/22/13 0435 09/23/13 0430 09/24/13 0400 09/25/13 0400 09/26/13 0400  INR 1.38 2.09* 1.58* 1.58* 2.08*   LDH 766>760>744>557>516>472>458>442  Co-ox:  57>62>60>66>65>61>60>51  Other results:  EKG:   Imaging: Dg Chest Port 1 View  09/26/2013   CLINICAL DATA:  Left ventricular assist device  EXAM: PORTABLE CHEST - 1 VIEW  COMPARISON:  Portable exam [id number] hr compared to 09/25/2013  FINDINGS: Left ventricular assist device and AICD leads unchanged.  Tip of right arm PICC line projects over SVC.  Enlargement of cardiac silhouette with pulmonary vascular congestion post median sternotomy.  Mild pulmonary edema, slightly greater on right, not significantly changed.  Minimal atelectasis right base.  No segmental consolidation or pneumothorax.  Questionable nodular density right upper lobe, without definite pulmonary nodule on recent CT chest, question superimposed external artifact, summation artifact, or minimal consolidation.  No acute osseous findings.  IMPRESSION: Minimal pulmonary edema and right basilar atelectasis.   Electronically Signed   By: Lavonia Dana M.D.   On: 09/26/2013 08:15   Dg Chest Port 1 View  09/25/2013   CLINICAL DATA:  Post left ventricular assist device insertion  EXAM: PORTABLE CHEST - 1 VIEW  COMPARISON:  Portable exam 0607 hr compared to 09/24/2013  FINDINGS: Left ventricular assist device again identified.  Left subclavian transvenous AICD leads project  over right atrium right ventricle.  Enlargement of cardiac silhouette post median sternotomy.  Right arm PICC line tip projects over SVC.  Mediastinal contours and pulmonary vascularity stable.  Bibasilar atelectasis and question small pleural effusions little changed.  Mild right perihilar infiltrate/edema.  No pneumothorax.  Bones demineralized.  IMPRESSION: Little interval change.   Electronically Signed   By: Lavonia Dana M.D.   On: 09/25/2013 08:25    CXR today shows bibasilar atelectasis  Medications:     Scheduled Medications: . amiodarone  200 mg Oral Daily  .  aspirin EC  325 mg Oral Daily   Or  . aspirin  324 mg Per Tube Daily   Or  . aspirin  300 mg Rectal Daily  . budesonide-formoterol  2 puff Inhalation BID  . ezetimibe  10 mg Oral Daily  . feeding supplement (ENSURE COMPLETE)  237 mL Oral BID BM  . feeding supplement (PRO-STAT SUGAR FREE 64)  30 mL Oral BID WC  . ferrous Q000111Q C-folic acid  1 capsule Oral q morning - 10a  . hydrALAZINE  12.5 mg Oral Once  . hydrALAZINE  25 mg Oral Q8H  . insulin aspart  0-15 Units Subcutaneous TID WC  . insulin aspart  0-5 Units Subcutaneous QHS  . levothyroxine  150 mcg Oral Custom   And  . [START ON 09/29/2013] levothyroxine  75 mcg Oral Custom  . OLANZapine  5 mg Oral Q24H  . pantoprazole  40 mg Oral Q1200  . sodium chloride  10-40 mL Intracatheter Q12H  . warfarin  5 mg Oral q1800  . Warfarin - Physician Dosing Inpatient   Does not apply q1800    Infusions: . sodium chloride 20 mL/hr (09/24/13 0237)  . sodium chloride      PRN Medications: sodium chloride, docusate sodium, haloperidol lactate, hydrALAZINE, levalbuterol, menthol-cetylpyridinium, midazolam, ondansetron (ZOFRAN) IV, potassium chloride, sodium chloride, sodium chloride, traMADol   Assessment:   1. 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 Acute on chronic renal failure, stage III - improved 4. Acute blood  loss anemia, expected  - total 4 PRBCs, 1 FFP  5. Persistent fever: resolved, off antibiotics.  6. Acute delirium   Plan/Discussion:    POD #8.  Stable from cardiac standpoint.  Main issue is delirium. Will try to move to 2W with sitter. Increase zyprexa to 5 qhs. Haldol prn.   Stopped milrinone yesterday but co-ox down. Will recheck co-ox today if < 55% will restart milrinone at 0.125.   Maintaining SR. Continue amio 20 daily. Off lasix currently. Will switch hydralazine to once a day amlodipine as he is refusing pills at time.   Continue to ambulate and work with PT as tolerated. .   Continue coumadin per Pharmacy.  I reviewed the LVAD parameters from today, and compared the results to the patient's prior recorded data. No programming changes were made. The LVAD is functioning within specified parameters. The patient performs LVAD self-test daily. LVAD interrogation was negative for any significant power changes, alarms or PI events/speed drops. LVAD equipment check completed and is in good working order. Back-up equipment present. LVAD education done on emergency procedures and precautions and reviewed exit site care.   Length of Stay: [place] 09/26/2013, 9:13 AM

## 2013-09-26 NOTE — Progress Notes (Signed)
Patient ID: Johnny Oneill, male   DOB: [birth year], 73 y.o.   MRN: [id number] HeartMate 2 Rounding Note  Subjective:    Mr.Klomp is a 73 year old patient with ischemic cardiomyopathy with advanced heart failure on the transplant waiting list at [facility] and supported with home milrinone since April 2014. On this admission he was admitted with acute on chronic heart failure was found to be in cardiogenic shock which responded only to increased dose of milrinone and placement of intra-aortic balloon pump. The patient was discussed at the [facility] conference and felt to be an appropriate candidate for LVAD implantation as a bridge to transplantation. Prior to surgery the details of LVAD implantation were thoroughly discussed with patient and his wife including the expected postop recovery in the long-term postoperative care and followup.   Postop day 8 VAD implantation  Intraoperative and postoperative course complicated by friable LV myocardium at the apical cannulation site resulting in bleeding from suture tears in the myocardium repaired by multiple extra pledgeted sutures placed before separating from cardio coronary bypass.   He had a stable night and slept > 3 hrs.  the patient's confusion has cont to improve but he still needs sitter  No PI events in 36 hrs.  Remains on Milrinone 0.125.  Co-ox 55% MAP >88  this am - getting PRN iv hydralazine. CVP 7 this am  He remains weak. Has only been doing 400 on IS. The patient walked 150 feet with physical therapy today  Wt 155lbs, decreased 2 lbs from yesterday- lasix off and pushing nutrition  LVAD INTERROGATION:  HeartMate II LVAD:  Flow 4.2 liters/min, speed 9000, power 5.3, PI 4 .2.    Objective:    Vital Signs:   Temp:  [97.5 F (36.4 C)-98.3 F (36.8 C)] 97.5 F (36.4 C) (01/27 0800) Pulse Rate:  [77-92] 83 (01/27 0900) Resp:  [13-27] 19 (01/27 0900) SpO2:  [92 %-99 %] 97 % (01/27 0900) Weight:  [155 lb 9.6 oz (70.58  kg)] 155 lb 9.6 oz (70.58 kg) (01/27 0437) Last BM Date: 09/25/13 Mean arterial Pressure 90-100, Max 106 this am  Intake/Output:   Intake/Output Summary (Last 24 hours) at 09/26/13 1118 Last data filed at 09/26/13 1000  Gross per 24 hour  Intake 450.11 ml  Output   1305 ml  Net -854.89 ml     Physical Exam: General:  Looks tired HEENT: normal Neck: supple. Carotids 2+ bilat; no bruits. No lymphadenopathy or thryomegaly appreciated. Cor:  LVAD hum present. Difficult to hear native heart sounds Lungs: Decreased breath sounds in lower lobes with crackles Abdomen: soft, nontender, nondistended. No hepatosplenomegaly. No bruits or masses. Good bowel sounds. Extremities: no cyanosis, clubbing, rash, edema Neuro: alert & orientedx3 but mentation a little slow, cranial nerves grossly intact. moves all 4 extremities w/o difficulty. Affect flat  Telemetry: sinus 93  Labs: Basic Metabolic Panel:  Recent Labs Lab 09/19/13 1556 09/20/13 0430  09/21/13 0404  09/22/13 0435 09/22/13 1400 09/23/13 0430 09/24/13 0400 09/25/13 0400 09/26/13 0400  NA 142 141  < > 140  < > 141 143 141 141 143 146  K 4.9 5.0  < > 4.0  < > 3.7 3.9 3.8 3.8 4.0 4.0  CL 104 103  < > 101  < > 103 104 103 103 106 109  CO2 22 20  < > 22  < > 24 23 24 23 23 24   GLUCOSE 100* 174*  < > 117*  < > 110* 127* 106*  94 100* 107*  BUN 32* 39*  < > 50*  < > 49* 48* 47* 46* 41* 35*  CREATININE 1.83* 2.06*  < > 2.02*  < > 1.72* 1.56* 1.57* 1.56* 1.40* 1.35  CALCIUM 8.9 8.7  < > 8.4  < > 8.2* 8.1* 8.2* 8.1* 8.2* 8.2*  MG 2.5 2.7*  --  2.6*  --  2.5  --  2.5  --   --   --   PHOS  --  5.6*  --  5.3*  --  3.7  --  3.3  --   --   --   < > = values in this interval not displayed.  Liver Function Tests:  Recent Labs Lab 09/22/13 0435 09/23/13 0430 09/24/13 0400 09/25/13 0400 09/26/13 0400  AST 266* 151* 108* 101* 78*  ALT 373* 305* 243* 193* 147*  ALKPHOS 117 121* 132* 121* 112  BILITOT 3.2* 2.4* 1.9* 1.7* 1.4*  PROT  5.9* 6.1 6.2 6.0 6.0  ALBUMIN 2.6* 2.5* 2.5* 2.5* 2.5*   No results found for this basename: LIPASE, AMYLASE,  in the last 168 hours No results found for this basename: AMMONIA,  in the last 168 hours  CBC:  Recent Labs Lab 09/21/13 0404  09/22/13 0435 09/23/13 0430 09/23/13 1510 09/24/13 0400 09/25/13 0400  WBC 12.3*  < > 10.4 9.8 9.2 10.5 10.5  NEUTROABS 11.4*  --  9.4* 8.8*  --  9.4* 9.3*  HGB 8.8*  < > 8.7* 8.9* 9.1* 8.9* 8.7*  HCT 26.0*  < > 26.0* 27.1* 28.0* 27.2* 27.5*  MCV 82.8  < > 82.5 81.9 82.6 83.4 85.7  PLT 125*  < > 141* 158 176 199 235  < > = values in this interval not displayed.  INR:  Recent Labs Lab 09/22/13 0435 09/23/13 0430 09/24/13 0400 09/25/13 0400 09/26/13 0400  INR 1.38 2.09* 1.58* 1.58* 2.08*   LDH 472, down from 516 yesterday  Other results:  EKG:   Imaging: Dg Chest Port 1 View  09/26/2013   CLINICAL DATA:  Left ventricular assist device  EXAM: PORTABLE CHEST - 1 VIEW  COMPARISON:  Portable exam [id number] hr compared to 09/25/2013  FINDINGS: Left ventricular assist device and AICD leads unchanged.  Tip of right arm PICC line projects over SVC.  Enlargement of cardiac silhouette with pulmonary vascular congestion post median sternotomy.  Mild pulmonary edema, slightly greater on right, not significantly changed.  Minimal atelectasis right base.  No segmental consolidation or pneumothorax.  Questionable nodular density right upper lobe, without definite pulmonary nodule on recent CT chest, question superimposed external artifact, summation artifact, or minimal consolidation.  No acute osseous findings.  IMPRESSION: Minimal pulmonary edema and right basilar atelectasis.   Electronically Signed   By: Lavonia Dana M.D.   On: 09/26/2013 08:15   Dg Chest Port 1 View  09/25/2013   CLINICAL DATA:  Post left ventricular assist device insertion  EXAM: PORTABLE CHEST - 1 VIEW  COMPARISON:  Portable exam 0607 hr compared to 09/24/2013  FINDINGS: Left  ventricular assist device again identified.  Left subclavian transvenous AICD leads project over right atrium right ventricle.  Enlargement of cardiac silhouette post median sternotomy.  Right arm PICC line tip projects over SVC.  Mediastinal contours and pulmonary vascularity stable.  Bibasilar atelectasis and question small pleural effusions little changed.  Mild right perihilar infiltrate/edema.  No pneumothorax.  Bones demineralized.  IMPRESSION: Little interval change.   Electronically Signed   By: Elta [place]  [name] Papas M.D.   On: 09/25/2013 08:25    CXR today shows bibasilar atelectasis  Medications:     Scheduled Medications: . amiodarone  200 mg Oral Daily  . amLODipine  5 mg Oral Daily  . aspirin EC  325 mg Oral Daily   Or  . aspirin  324 mg Per Tube Daily   Or  . aspirin  300 mg Rectal Daily  . ezetimibe  10 mg Oral Daily  . feeding supplement (ENSURE COMPLETE)  237 mL Oral BID BM  . feeding supplement (PRO-STAT SUGAR FREE 64)  30 mL Oral BID WC  . ferrous Q000111Q C-folic acid  1 capsule Oral q morning - 10a  . levothyroxine  150 mcg Oral Custom   And  . [START ON 09/29/2013] levothyroxine  75 mcg Oral Custom  . OLANZapine  5 mg Oral Q24H  . pantoprazole  40 mg Oral Q1200  . sodium chloride  10-40 mL Intracatheter Q12H  . warfarin  5 mg Oral q1800  . Warfarin - Physician Dosing Inpatient   Does not apply q1800    Infusions: . sodium chloride 20 mL/hr (09/24/13 0237)  . sodium chloride    . milrinone      PRN Medications: sodium chloride, docusate sodium, hydrALAZINE, levalbuterol, menthol-cetylpyridinium, ondansetron (ZOFRAN) IV, potassium chloride, sodium chloride, sodium chloride, traMADol   Assessment:   1 acute on chronic systolic heart failure EF 20% requiring implantable LVAD January 19,015  2 ischemic cardiomyopathy  3 perioperative coagulopathy now improved  4 ventilator dependent respiratory insufficiency due to pulmonary edema, possible ARDS from  cardio pulmonary bypass-now resolved and patient is extubated  5 postop delerium  Plan/Discussion:    He has been very stable on Milrinone 0.125. Off mil his Co-ox dropped so will continue.  INR at 2.0. Will give 5 mg coumadin today   leave anterior MTfor > 19ml drainage  Foley removed but pt had difficulty voiding and foley reinserted with removal of a clot. Since he is weak and not very mobile will leave foley in today.  Generally weak and debilitated, sleep deprived. Try to transfer to stepdown when bed available  I reviewed the LVAD parameters from today, and compared the results to the patient's prior recorded data.  No programming changes were made.  The LVAD is functioning within specified parameters.  The patient performs LVAD self-test daily.  LVAD interrogation was negative for any significant power changes, alarms or PI events/speed drops.  LVAD equipment check completed and is in good working order.  Back-up equipment present.   LVAD education done on emergency procedures and precautions and reviewed exit site care.  Length of Stay: [place] 09/26/2013, 11:18 AM

## 2013-09-26 NOTE — Progress Notes (Signed)
Physical Therapy Treatment Patient Details Name: Johnny Oneill MRN: [id number] DOB: [birth year] Today's Date: 09/26/2013 Time: [id number] PT Time Calculation (min): 29 min  PT Assessment / Plan / Recommendation  History of Present Illness Johnny Oneill is a 73 year old male with history of CAD s/p stent to LCX 2004 with re-occlusion of LCX with lateral MI 2009 and repeat stenting, total occlusion of RCA, 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 S/P L TKR 2013. Last Cath 2009..  [name]   PT Comments   Continue LVAD education with switchover to battery power.  Pt having difficulty focusing on task, but not agitated as yesterday.  Pt has to be assisted to unscrew and change over harness connections.  Needs direction on sequence or steps.   Gait tolerance much improved, though gait a bit unsteady.  PI stay at or higher at 7.0 during ambulation.  Pt's response to activity good.   Follow Up Recommendations  Home health PT;Supervision/Assistance - 24 hour;Supervision for mobility/OOB     Does the patient have the potential to tolerate intense rehabilitation     Barriers to Discharge        Equipment Recommendations  Other (comment) (TBA)    Recommendations for Other Services    Frequency Min 5X/week   Progress towards PT Goals Progress towards PT goals: Progressing toward goals  Plan Current plan remains appropriate    Precautions / Restrictions Precautions Precautions: Sternal;Other (comment) Precaution Comments: Multiple lines; LVAD equipment Restrictions Weight Bearing Restrictions: Yes (sternal precautions) Other Position/Activity Restrictions: Sternal precautions   Pertinent Vitals/Pain See above.    Mobility  Transfers Overall transfer level: Needs assistance Equipment used: Pushed w/c Transfers: Sit to/from Stand Sit to Stand: Min assist;+2 safety/equipment General  transfer comment: Pt requires +2 (and RN followed) .  Pt needing guiding cues. Ambulation/Gait Ambulation/Gait assistance: Min assist;Min guard;+2 safety/equipment Ambulation Distance (Feet): 280 Feet Assistive device:  (pushing w/c) Gait Pattern/deviations: Step-through pattern;Drifts right/left;Trunk flexed;Wide base of support Gait velocity: slower, but improving speeds Gait velocity interpretation: Below normal speed for age/gender    Exercises     PT Diagnosis:    PT Problem List:   PT Treatment Interventions:     PT Goals (current goals can now be found in the care plan section) Acute Rehab PT Goals PT Goal Formulation: With patient Time For Goal Achievement: 10/04/13 Potential to Achieve Goals: Good  Visit Information  Last PT Received On: 09/26/13 Assistance Needed: +2 PT/OT/SLP Co-Evaluation/Treatment: Yes History of Present Illness: Johnny Oneill is a 73 year old male with history of CAD s/p stent to LCX 2004 with re-occlusion of LCX with lateral MI 2009 and repeat stenting, total occlusion of RCA, 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 S/P L TKR 2013. Last Cath 2009..  [name]    Subjective Data  Subjective: I'm more ready today   Cognition  Cognition Arousal/Alertness: Awake/alert Behavior During Therapy: Flat affect Overall Cognitive Status: Impaired/Different from baseline Area of Impairment: Orientation;Attention;Memory;Following commands;Safety/judgement;Awareness;Problem solving Orientation Level: Place;Time Current Attention Level: Focused;Sustained (fluctuated) Memory: Decreased recall of precautions;Decreased short-term memory Following Commands: Follows one step commands with increased time (and verbal cues) Safety/Judgement: Decreased awareness of safety;Decreased awareness of deficits Problem Solving: Slow processing;Decreased initiation;Difficulty  sequencing;Requires verbal cues;Requires tactile cues General Comments: Pt confused.  Less irrritable.  Pt still impulsive and not  focused enough to fully participate in teaching    Balance  Balance Overall balance assessment: Needs assistance Sitting-balance support: No upper extremity supported Sitting balance-Leahy Scale: Good Sitting balance - Comments: Pt with posterior leand and flexed trunk Requires verbal cues for erect posture Standing balance support: Bilateral upper extremity supported Standing balance-Leahy Scale: Fair  End of Session PT - End of Session Equipment Utilized During Treatment: Oxygen Activity Tolerance: Patient tolerated treatment well Patient left: with call bell/phone within reach;with nursing/sitter in room;in chair Nurse Communication: Mobility status   GP     Tamari Redwine, Tessie Fass 09/26/2013, 11:59 AM 09/26/2013  Donnella Sham, [place] [phone]  (pager)

## 2013-09-27 ENCOUNTER — Inpatient Hospital Stay (HOSPITAL_COMMUNITY): Payer: Medicare Other

## 2013-09-27 LAB — COMPREHENSIVE METABOLIC PANEL
ALT: 112 U/L — ABNORMAL HIGH (ref 0–53)
AST: 60 U/L — ABNORMAL HIGH (ref 0–37)
Albumin: 2.4 g/dL — ABNORMAL LOW (ref 3.5–5.2)
Alkaline Phosphatase: 116 U/L (ref 39–117)
BUN: 29 mg/dL — ABNORMAL HIGH (ref 6–23)
CO2: 23 mEq/L (ref 19–32)
Calcium: 8.2 mg/dL — ABNORMAL LOW (ref 8.4–10.5)
Chloride: 111 mEq/L (ref 96–112)
Creatinine, Ser: 1.35 mg/dL (ref 0.50–1.35)
GFR calc Af Amer: 60 mL/min — ABNORMAL LOW (ref >90)
GFR calc non Af Amer: 52 mL/min — ABNORMAL LOW (ref >90)
Glucose, Bld: 88 mg/dL (ref 70–99)
Potassium: 3.9 mEq/L (ref 3.7–5.3)
Sodium: 147 mEq/L (ref 137–147)
Total Bilirubin: 1.3 mg/dL — ABNORMAL HIGH (ref 0.3–1.2)
Total Protein: 6 g/dL (ref 6.0–8.3)

## 2013-09-27 LAB — GLUCOSE, CAPILLARY
Glucose-Capillary: 109 mg/dL — ABNORMAL HIGH (ref 70–99)
Glucose-Capillary: 105 mg/dL — ABNORMAL HIGH (ref 70–99)
Glucose-Capillary: 115 mg/dL — ABNORMAL HIGH (ref 70–99)
Glucose-Capillary: 108 mg/dL — ABNORMAL HIGH (ref 70–99)
Glucose-Capillary: 111 mg/dL — ABNORMAL HIGH (ref 70–99)

## 2013-09-27 LAB — LACTATE DEHYDROGENASE: LDH: 507 U/L — ABNORMAL HIGH (ref 94–250)

## 2013-09-27 LAB — CBC
HCT: 30.2 % — ABNORMAL LOW (ref 39.0–52.0)
Hemoglobin: 9.3 g/dL — ABNORMAL LOW (ref 13.0–17.0)
MCH: 27 pg (ref 26.0–34.0)
MCHC: 30.8 g/dL (ref 30.0–36.0)
MCV: 87.8 fL (ref 78.0–100.0)
Platelets: 311 10*3/uL (ref 150–400)
RBC: 3.44 MIL/uL — ABNORMAL LOW (ref 4.22–5.81)
RDW: 19.5 % — ABNORMAL HIGH (ref 11.5–15.5)
WBC: 10.2 10*3/uL (ref 4.0–10.5)

## 2013-09-27 LAB — CARBOXYHEMOGLOBIN
Carboxyhemoglobin: 2.8 % — ABNORMAL HIGH (ref 0.5–1.5)
Methemoglobin: 1.6 % — ABNORMAL HIGH (ref 0.0–1.5)
O2 Saturation: 83 %
Total hemoglobin: 9.3 g/dL — ABNORMAL LOW (ref 13.5–18.0)

## 2013-09-27 LAB — PROTIME-INR
INR: 1.96 — ABNORMAL HIGH (ref 0.00–1.49)
Prothrombin Time: 21.7 seconds — ABNORMAL HIGH (ref 11.6–15.2)

## 2013-09-27 MED ORDER — FUROSEMIDE 10 MG/ML IJ SOLN: 40.0000 mg | Freq: Once | INTRAMUSCULAR | Status: DC

## 2013-09-27 MED ORDER — HYDRALAZINE HCL 25 MG PO TABS
25.0000 mg | ORAL_TABLET | Freq: Three times a day (TID) | ORAL | Status: DC
  Administered 2013-09-27 – 2013-09-28 (×3): 25 mg via ORAL
  Filled 2013-09-27 (×6): qty 1

## 2013-09-27 MED ORDER — FUROSEMIDE 10 MG/ML IJ SOLN
20.0000 mg | Freq: Once | INTRAMUSCULAR | Status: AC
  Administered 2013-09-27: 20 mg via INTRAVENOUS

## 2013-09-27 MED ORDER — FUROSEMIDE 10 MG/ML IJ SOLN
INTRAMUSCULAR | Status: AC
  Administered 2013-09-27: 11:00:00
  Filled 2013-09-27: qty 4

## 2013-09-27 MED ORDER — DIPHENHYDRAMINE HCL 25 MG PO CAPS
25.0000 mg | ORAL_CAPSULE | Freq: Every day | ORAL | Status: DC
  Administered 2013-09-27 – 2013-10-02 (×6): 25 mg via ORAL
  Filled 2013-09-27 (×10): qty 1

## 2013-09-27 NOTE — Progress Notes (Signed)
Physical Therapy Treatment Patient Details Name: ARTIST ZLOTNICK MRN: [id number] DOB: [birth year] Today's Date: 09/27/2013 Time: [id number] PT Time Calculation (min): 38 min  PT Assessment / Plan / Recommendation  History of Present Illness Mr Waldeck is a 73 year old male with history of CAD s/p stent to LCX 2004 with re-occlusion of LCX with lateral MI 2009 and repeat stenting, total occlusion of RCA, 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 S/P L TKR 2013. Last Cath 2009..  [name]   PT Comments   Progressing slowly due to past days of delerium.  Slowly clearing and able to focus more on education.  Wife has participated in the switch over and back of batteries for gait in the halls.  PI and overall pt response good throughout gait trial.  Pt's wife reports pt is much paler than previous days.   Follow Up Recommendations  Home health PT;Supervision/Assistance - 24 hour;Supervision for mobility/OOB     Does the patient have the potential to tolerate intense rehabilitation     Barriers to Discharge        Equipment Recommendations  Other (comment) (TBA)    Recommendations for Other Services    Frequency Min 5X/week   Progress towards PT Goals Progress towards PT goals: Progressing toward goals  Plan Current plan remains appropriate    Precautions / Restrictions Precautions Precautions: Sternal;Other (comment) Precaution Comments: Multiple lines; LVAD equipment 1 more CT Restrictions Weight Bearing Restrictions: No (sternal precautions) Other Position/Activity Restrictions: Sternal precautions   Pertinent Vitals/Pain See above     Mobility  Bed Mobility Overal bed mobility: Needs Assistance Bed Mobility: Supine to Sit Supine to sit: Mod assist General bed mobility comments: v/tc's for initiation and safe sequencing; needed truncal assist Transfers Overall transfer  level: Needs assistance Transfers: Sit to/from Stand Sit to Stand: Min assist;+2 safety/equipment;Mod assist (mod for low suface of the toilet) General transfer comment: Still require close obs due to continues to push up if not cued.  needs lifting and come forward assist Ambulation/Gait Ambulation/Gait assistance: Min assist;+2 safety/equipment Ambulation Distance (Feet): 130 Feet Assistive device: Rolling walker (2 wheeled) (pushing w/c) Gait Pattern/deviations: Step-through pattern;Decreased step length - right;Decreased step length - left;Decreased stride length;Shuffle;Trunk flexed;Wide base of support Gait velocity: slower, but improving speeds Gait velocity interpretation: Below normal speed for age/gender    Exercises     PT Diagnosis:    PT Problem List:   PT Treatment Interventions:     PT Goals (current goals can now be found in the care plan section) Acute Rehab PT Goals Patient Stated Goal: to get better PT Goal Formulation: With patient Time For Goal Achievement: 10/04/13 Potential to Achieve Goals: Good  Visit Information  Last PT Received On: 09/27/13 Assistance Needed: +2 (for safety, lines) PT/OT/SLP Co-Evaluation/Treatment: Yes History of Present Illness: Mr Botz is a 73 year old male with history of CAD s/p stent to LCX 2004 with re-occlusion of LCX with lateral MI 2009 and repeat stenting, total occlusion of RCA, 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 S/P L TKR 2013. Last Cath 2009..  [name]    Subjective Data  Subjective: Pt's wife asked to be able to do the set up and take down today Patient Stated Goal: to get better   Cognition  Cognition Arousal/Alertness: Awake/alert Behavior During Therapy: Flat  affect Overall Cognitive Status: Impaired/Different from baseline (but improving) Area of Impairment: Orientation;Memory;Following  commands;Safety/judgement;Problem solving;Awareness Current Attention Level: Sustained (fluctuated) Memory: Decreased recall of precautions;Decreased short-term memory Following Commands: Follows one step commands with increased time;Follows one step commands consistently (and verbal cues) Safety/Judgement: Decreased awareness of safety Problem Solving: Slow processing;Decreased initiation;Difficulty sequencing;Requires verbal cues;Requires tactile cues General Comments: Still confused, but definite improvements    Balance  Balance Overall balance assessment: Needs assistance Sitting-balance support: No upper extremity supported;Single extremity supported;Feet supported Standing balance support: Bilateral upper extremity supported Standing balance-Leahy Scale: Fair  End of Session PT - End of Session Equipment Utilized During Treatment: Oxygen Activity Tolerance: Patient tolerated treatment well Patient left: with call bell/phone within reach;with nursing/sitter in room;in chair;with family/visitor present Nurse Communication: Mobility status   GP     Bana Borgmeyer, Tessie Fass 09/27/2013, 12:23 PM 09/27/2013  Donnella Sham, [place] [phone]  (pager)

## 2013-09-27 NOTE — Progress Notes (Signed)
dc'ed ct. Pt tolerated well 

## 2013-09-27 NOTE — Progress Notes (Signed)
Patient ID: Johnny Oneill, male   DOB: 1941-08-22, 73 y.o.   MRN: [id number] HeartMate 2 Rounding Note  Subjective:    Mr.Cupp is a 73 year old patient with ischemic cardiomyopathy with advanced heart failure on the transplant waiting list at [facility] and supported with home milrinone since April 2014. On this admission he was admitted with acute on chronic heart failure was found to be in cardiogenic shock which responded only to increased dose of milrinone and placement of intra-aortic balloon pump. The patient was discussed at the [facility] conference and felt to be an appropriate candidate for LVAD implantation as a bridge to transplantation. Prior to surgery the details of LVAD implantation were thoroughly discussed with patient and his wife including the expected postop recovery in the long-term postoperative care and followup.   Postop day 9 LVAD implantation   Intraoperative and postoperative course complicated by friable LV myocardium at the apical cannulation site resulting in bleeding from suture tears in the myocardium repaired by multiple extra pledgeted sutures placed before separating from cardio coronary bypass.   Doing well from cardiac standpoint. Still delirious but seems to be improving. Sleeping better.  Weight stable. Denies dyspnea. Maintaining  SR. Oriented this am, pleasant affect.   Milrinone 0.125 restarted yesterday for  Co-ox drop from  60%>51%; Co-ox this am 83 MAP 90's on Norvasc 5 mg    LVAD INTERROGATION:  HeartMate II LVAD:  Flow 4.2 liters/min, speed 9000, power 4.9, PI 6.5.  No PI events or alarms  Objective:    Vital Signs:   Temp:  [97.5 F (36.4 C)-98.4 F (36.9 C)] 97.6 F (36.4 C) (01/28 0405) Pulse Rate:  [78-111] 93 (01/28 0405) Resp:  [13-22] 18 (01/28 0405) SpO2:  [91 %-98 %] 93 % (01/28 0405) Weight:  [159 lb 9.8 oz (72.4 kg)] 159 lb 9.8 oz (72.4 kg) (01/28 0405) Last BM Date: 09/26/13 Mean arterial Pressure 90-100, Max 106 this  am  Intake/Output:   Intake/Output Summary (Last 24 hours) at 09/27/13 1154 Last data filed at 09/27/13 0707  Gross per 24 hour  Intake  201.3 ml  Output    790 ml  Net -588.7 ml     Physical Exam: General: sitting in chair. Stares blankly Nonfocal HEENT: normal Neck: JVP 9-10 supple. Carotids 2+ bilat; no bruits. No lymphadenopathy or thryomegaly appreciated. Cor:  LVAD hum present. Difficult to hear native heart sounds Lungs: Decreased breath sounds in lower lobes o/w clear Abdomen: soft, nontender, nondistended. No hepatosplenomegaly. No bruits or masses. Good bowel sounds. Extremities: no cyanosis, clubbing, rash, edema Neuro: delirious. Non-focal.   Telemetry: sinus 80-90s  Labs: Basic Metabolic Panel:  Recent Labs Lab 09/21/13 0404  09/22/13 0435  09/23/13 0430 09/24/13 0400 09/25/13 0400 09/26/13 0400 09/27/13 0430  NA 140  < > 141  < > 141 141 143 146 147  K 4.0  < > 3.7  < > 3.8 3.8 4.0 4.0 3.9  CL 101  < > 103  < > 103 103 106 109 111  CO2 22  < > 24  < > 24 23 23 24 23   GLUCOSE 117*  < > 110*  < > 106* 94 100* 107* 88  BUN 50*  < > 49*  < > 47* 46* 41* 35* 29*  CREATININE 2.02*  < > 1.72*  < > 1.57* 1.56* 1.40* 1.35 1.35  CALCIUM 8.4  < > 8.2*  < > 8.2* 8.1* 8.2* 8.2* 8.2*  MG 2.6*  --  2.5  --  2.5  --   --   --   --   PHOS 5.3*  --  3.7  --  3.3  --   --   --   --   < > = values in this interval not displayed.  Liver Function Tests:  Recent Labs Lab 09/23/13 0430 09/24/13 0400 09/25/13 0400 09/26/13 0400 09/27/13 0430  AST 151* 108* 101* 78* 60*  ALT 305* 243* 193* 147* 112*  ALKPHOS 121* 132* 121* 112 116  BILITOT 2.4* 1.9* 1.7* 1.4* 1.3*  PROT 6.1 6.2 6.0 6.0 6.0  ALBUMIN 2.5* 2.5* 2.5* 2.5* 2.4*   No results found for this basename: LIPASE, AMYLASE,  in the last 168 hours No results found for this basename: AMMONIA,  in the last 168 hours  CBC:  Recent Labs Lab 09/21/13 0404  09/22/13 0435 09/23/13 0430 09/23/13 1510  09/24/13 0400 09/25/13 0400  WBC 12.3*  < > 10.4 9.8 9.2 10.5 10.5  NEUTROABS 11.4*  --  9.4* 8.8*  --  9.4* 9.3*  HGB 8.8*  < > 8.7* 8.9* 9.1* 8.9* 8.7*  HCT 26.0*  < > 26.0* 27.1* 28.0* 27.2* 27.5*  MCV 82.8  < > 82.5 81.9 82.6 83.4 85.7  PLT 125*  < > 141* 158 176 199 235  < > = values in this interval not displayed.  INR:  2.08  Recent Labs Lab 09/23/13 0430 09/24/13 0400 09/25/13 0400 09/26/13 0400 09/27/13 0430  INR 2.09* 1.58* 1.58* 2.08* 1.96*   LDH [id number]  Co-ox:  57>62>60>66>65>61>60>51>83  Other results:  EKG:   Imaging: Dg Chest Port 1 View  09/27/2013   CLINICAL DATA:  Followup ventricular assist device.  EXAM: PORTABLE CHEST - 1 VIEW  COMPARISON:  09/26/2013  FINDINGS: There are lower lung volumes on the current exam leading to the appearance of increased vascular prominence and hazy perihilar and lung base opacity. This is consistent with minimal pulmonary edema, but not felt to be significantly changed allowing for the differences in lung volume. There is also mild perihilar and lung base atelectasis.  Left ventricular assist device placement is stable. Right PICC tip lies in the lower superior vena cava, also stable. Left anterior chest wall AICD is stable and well positioned.  No gross pneumothorax noted on this semi-erect study. There is no mediastinal widening. Cardiac silhouette is mildly enlarged.  IMPRESSION: 1. No significant change from the previous day's study allowing for differences in lung volumes. Minimal persistent pulmonary edema is again suggested, with superimposed perihilar and lung base atelectasis.   Electronically Signed   By: Lajean Manes M.D.   On: 09/27/2013 08:39   Dg Chest Port 1 View  09/26/2013   CLINICAL DATA:  Left ventricular assist device  EXAM: PORTABLE CHEST - 1 VIEW  COMPARISON:  Portable exam [id number] hr compared to 09/25/2013  FINDINGS: Left ventricular assist device and AICD leads unchanged.  Tip of  right arm PICC line projects over SVC.  Enlargement of cardiac silhouette with pulmonary vascular congestion post median sternotomy.  Mild pulmonary edema, slightly greater on right, not significantly changed.  Minimal atelectasis right base.  No segmental consolidation or pneumothorax.  Questionable nodular density right upper lobe, without definite pulmonary nodule on recent CT chest, question superimposed external artifact, summation artifact, or minimal consolidation.  No acute osseous findings.  IMPRESSION: Minimal pulmonary edema and right basilar atelectasis.   Electronically Signed   By: Lavonia Dana M.D.   On: 09/26/2013  08:15    CXR today shows bibasilar atelectasis  Medications:     Scheduled Medications: . amiodarone  200 mg Oral Daily  . amLODipine  5 mg Oral Daily  . aspirin EC  325 mg Oral Daily   Or  . aspirin  324 mg Per Tube Daily   Or  . aspirin  300 mg Rectal Daily  . diphenhydrAMINE  25 mg Oral QHS  . ezetimibe  10 mg Oral Daily  . feeding supplement (ENSURE COMPLETE)  237 mL Oral BID BM  . feeding supplement (PRO-STAT SUGAR FREE 64)  30 mL Oral BID WC  . ferrous [id number] C-folic acid  1 capsule Oral q morning - 10a  . furosemide      . hydrALAZINE  25 mg Oral Q8H  . levothyroxine  150 mcg Oral Custom   And  . [START ON 09/29/2013] levothyroxine  75 mcg Oral Custom  . OLANZapine  5 mg Oral Q24H  . pantoprazole  40 mg Oral Q1200  . sodium chloride  10-40 mL Intracatheter Q12H  . warfarin  5 mg Oral q1800  . Warfarin - Physician Dosing Inpatient   Does not apply q1800    Infusions: . sodium chloride 20 mL/hr (09/24/13 0237)  . sodium chloride    . milrinone 0.125 mcg/kg/min (09/26/13 1127)    PRN Medications: sodium chloride, docusate sodium, hydrALAZINE, levalbuterol, menthol-cetylpyridinium, ondansetron (ZOFRAN) IV, potassium chloride, sodium chloride, sodium chloride, traMADol   Assessment:   1. A/C Systolic Heart Failure ECHO EF 20-25%   --s/p HM II VAD 09/18/13  2.CAD  3. Acute on chronic renal failure, stage III - improved 4. Acute blood loss anemia, expected  - total 4 PRBCs, 1 FFP  5. Persistent fever: resolved, off antibiotics.  6. Acute delirium   Plan/Discussion:    POD #9.  Stable from cardiac standpoint.  Delirium seems to be improving slowly. Continue Zyprexa. Use Benadryl to help sleep as needed (uses this at home).   Stopped milrinone yesterday but co-ox down. Recheck co-ox was 53,  milrinone restarted at 0.125. Will try to wean again tomorrow.  Maintaining SR. Continue amio 200 daily. Off lasix currently. HTN improved with amlodipine.   Continue to ambulate and work with PT as tolerated. .   Continue coumadin per Pharmacy.  I reviewed the LVAD parameters from today, and compared the results to the patient's prior recorded data. No programming changes were made. The LVAD is functioning within specified parameters. The patient performs LVAD self-test daily. LVAD interrogation was negative for any significant power changes, alarms or PI events/speed drops. LVAD equipment check completed and is in good working order. Back-up equipment present. LVAD education done on emergency procedures and precautions and reviewed exit site care.   Length of Stay: 14  Marysol Wellnitz MD 09/27/2013, 11:54 AM

## 2013-09-28 ENCOUNTER — Inpatient Hospital Stay (HOSPITAL_COMMUNITY): Payer: Medicare Other

## 2013-09-28 DIAGNOSIS — I10 Essential (primary) hypertension: Secondary | ICD-10-CM

## 2013-09-28 DIAGNOSIS — I5023 Acute on chronic systolic (congestive) heart failure: Secondary | ICD-10-CM

## 2013-09-28 DIAGNOSIS — Z95818 Presence of other cardiac implants and grafts: Secondary | ICD-10-CM

## 2013-09-28 LAB — CBC
HCT: 27.7 % — ABNORMAL LOW (ref 39.0–52.0)
Hemoglobin: 8.8 g/dL — ABNORMAL LOW (ref 13.0–17.0)
MCH: 27.6 pg (ref 26.0–34.0)
MCHC: 31.8 g/dL (ref 30.0–36.0)
MCV: 86.8 fL (ref 78.0–100.0)
Platelets: 309 10*3/uL (ref 150–400)
RBC: 3.19 MIL/uL — ABNORMAL LOW (ref 4.22–5.81)
RDW: 19.5 % — ABNORMAL HIGH (ref 11.5–15.5)
WBC: 9.9 10*3/uL (ref 4.0–10.5)

## 2013-09-28 LAB — COMPREHENSIVE METABOLIC PANEL
ALT: 83 U/L — ABNORMAL HIGH (ref 0–53)
AST: 53 U/L — ABNORMAL HIGH (ref 0–37)
Albumin: 2.1 g/dL — ABNORMAL LOW (ref 3.5–5.2)
Alkaline Phosphatase: 111 U/L (ref 39–117)
BUN: 23 mg/dL (ref 6–23)
CO2: 25 mEq/L (ref 19–32)
Calcium: 7.8 mg/dL — ABNORMAL LOW (ref 8.4–10.5)
Chloride: 109 mEq/L (ref 96–112)
Creatinine, Ser: 1.36 mg/dL — ABNORMAL HIGH (ref 0.50–1.35)
GFR calc Af Amer: 59 mL/min — ABNORMAL LOW (ref >90)
GFR calc non Af Amer: 51 mL/min — ABNORMAL LOW (ref >90)
Glucose, Bld: 100 mg/dL — ABNORMAL HIGH (ref 70–99)
Potassium: 3.7 mEq/L (ref 3.7–5.3)
Sodium: 144 mEq/L (ref 137–147)
Total Bilirubin: 1.2 mg/dL (ref 0.3–1.2)
Total Protein: 5.5 g/dL — ABNORMAL LOW (ref 6.0–8.3)

## 2013-09-28 LAB — GLUCOSE, CAPILLARY
Glucose-Capillary: 107 mg/dL — ABNORMAL HIGH (ref 70–99)
Glucose-Capillary: 118 mg/dL — ABNORMAL HIGH (ref 70–99)
Glucose-Capillary: 120 mg/dL — ABNORMAL HIGH (ref 70–99)
Glucose-Capillary: 110 mg/dL — ABNORMAL HIGH (ref 70–99)
Glucose-Capillary: 101 mg/dL — ABNORMAL HIGH (ref 70–99)

## 2013-09-28 LAB — CARBOXYHEMOGLOBIN
Carboxyhemoglobin: 2.5 % — ABNORMAL HIGH (ref 0.5–1.5)
Methemoglobin: 1 % (ref 0.0–1.5)
O2 Saturation: 58.8 %
Total hemoglobin: 9.7 g/dL — ABNORMAL LOW (ref 13.5–18.0)

## 2013-09-28 LAB — PROTIME-INR
INR: 1.99 — ABNORMAL HIGH (ref 0.00–1.49)
Prothrombin Time: 22 seconds — ABNORMAL HIGH (ref 11.6–15.2)

## 2013-09-28 LAB — LACTATE DEHYDROGENASE: LDH: 424 U/L — ABNORMAL HIGH (ref 94–250)

## 2013-09-28 MED ORDER — FUROSEMIDE 20 MG PO TABS
20.0000 mg | ORAL_TABLET | Freq: Every day | ORAL | Status: DC
  Administered 2013-09-28 – 2013-09-29 (×2): 20 mg via ORAL
  Filled 2013-09-28 (×3): qty 1

## 2013-09-28 MED ORDER — POTASSIUM CHLORIDE CRYS ER 20 MEQ PO TBCR
20.0000 meq | EXTENDED_RELEASE_TABLET | Freq: Once | ORAL | Status: AC
  Administered 2013-09-29: 20 meq via ORAL
  Filled 2013-09-28: qty 1

## 2013-09-28 MED ORDER — POTASSIUM CHLORIDE CRYS ER 20 MEQ PO TBCR
40.0000 meq | EXTENDED_RELEASE_TABLET | Freq: Once | ORAL | Status: AC
  Administered 2013-09-28: 40 meq via ORAL
  Filled 2013-09-28: qty 2

## 2013-09-28 MED ORDER — AMLODIPINE BESYLATE 10 MG PO TABS
10.0000 mg | ORAL_TABLET | Freq: Every day | ORAL | Status: DC
  Administered 2013-09-28 – 2013-10-03 (×6): 10 mg via ORAL
  Filled 2013-09-28 (×6): qty 1

## 2013-09-28 NOTE — Progress Notes (Signed)
Occupational Therapy Treatment Patient Details Name: Johnny Oneill MRN: [id number] DOB: [birth year] Today's Date: 09/28/2013 Time: 1525-1610 OT Time Calculation (min): 45 min  OT Assessment / Plan / Recommendation  History of present illness Johnny Oneill is a 73 year old male with history of CAD s/p stent to LCX 2004 with re-occlusion of LCX with lateral MI 2009 and repeat stenting, total occlusion of RCA, 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 S/P L TKR 2013. Last Cath 2009..  [name]   OT comments  Pt significantly improved today.  Pt very interactive. Able to switch from battery to power supply with supervision.  He is beginning to perform BADLs and wife encouraged to allow him to do so rather than assisting.  See PT note for ambulation distance.   Follow Up Recommendations  No OT follow up;Supervision/Assistance - 24 hour    Barriers to Discharge       Equipment Recommendations  Other (comment) (rollator)    Recommendations for Other Services    Frequency Min 3X/week   Progress towards OT Goals Progress towards OT goals: Progressing toward goals  Plan Discharge plan remains appropriate    Precautions / Restrictions Precautions Precautions: Sternal;Other (comment) Precaution Comments: LVAD equipment   Pertinent Vitals/Pain     ADL  Lower Body Dressing: Minimal assistance Where Assessed - Lower Body Dressing: Unsupported sit to stand Toilet Transfer: Min guard Toilet Transfer Method: Sit to stand;Stand pivot Science writer: Comfort height toilet;Regular height toilet Toileting - Clothing Manipulation and Hygiene: Minimal assistance Where Assessed - Camera operator Manipulation and Hygiene: Standing Transfers/Ambulation Related to ADLs: min guard assist ADL Comments: Pt required one verbal cue for sternal precautions.  He was able to doff shoes today.  He  was able to switch from batter to power supply with supervision (PT had worked with him on switching power supply to battery).  Instructed him re: need for shower seat at home and that he cannot shower until cleard by LVAD team/MD and the rationale for this. Encouraged he and wife to allow him to perform BADLs as she has been assisting hime    OT Diagnosis:    OT Problem List:   OT Treatment Interventions:     OT Goals(current goals can now be found in the care plan section) Acute Rehab OT Goals Time For Goal Achievement: 10/04/13 Potential to Achieve Goals: Good ADL Goals Pt Will Perform Grooming: with min guard assist;standing Pt Will Perform Upper Body Bathing: with supervision;sitting Pt Will Perform Lower Body Bathing: with min assist;sit to/from stand Pt Will Perform Upper Body Dressing: with supervision;sitting Pt Will Perform Lower Body Dressing: with min assist;sit to/from stand Pt Will Transfer to Toilet: with min guard assist;ambulating;bedside commode Pt Will Perform Toileting - Clothing Manipulation and hygiene: with min guard assist;sit to/from stand Additional ADL Goal #1: Pt will be independent with sternal precautions during all BADLs and functional mobility Additional ADL Goal #2: Pt will be independent with managing LVAD equipment   Visit Information  Last OT Received On: 09/28/13 Assistance Needed: +2 PT/OT/SLP Co-Evaluation/Treatment: Yes Reason for Co-Treatment: Complexity of the patient's impairments (multi-system involvement) OT goals addressed during session: ADL's and self-care History of Present Illness: Johnny Oneill is a 73 year old male with history of CAD s/p stent to LCX 2004 with re-occlusion of LCX with lateral MI 2009 and repeat stenting, total occlusion of RCA, chronic systolic heart failure EF  35% on chronic Milrinone at 0.375 mcg, ICD 2009, PVCs, hypothyroidism, hyperlipidemia, and arthritis. S/P L TKR 2013. Last Cath 2009..  [name]     Subjective Data      Prior Functioning       Cognition  Cognition Arousal/Alertness: Awake/alert Behavior During Therapy: WFL for tasks assessed/performed Overall Cognitive Status: Within Functional Limits for tasks assessed General Comments: Pt appears back to baseline     Mobility  Bed Mobility Overal bed mobility: Needs Assistance Bed Mobility: Sit to Sidelying Sit to sidelying: Supervision Transfers Overall transfer level: Needs assistance Equipment used: Rolling walker (2 wheeled) (LVAD black bag) Transfers: Sit to/from Stand Sit to Stand: Min guard General transfer comment: one verbal cue for hand placement    Exercises      Balance Balance Standing balance support: Bilateral upper extremity supported Standing balance-Leahy Scale: Fair  End of Session OT - End of Session Equipment Utilized During Treatment: Rolling walker (LVAD equip) Activity Tolerance: Patient tolerated treatment well Patient left: in bed;with call bell/phone within reach;with family/visitor present Nurse Communication: Mobility status  Upson, Ryon Layton M 09/28/2013, 5:05 PM

## 2013-09-28 NOTE — Progress Notes (Signed)
Patient ID: Johnny Oneill, male   DOB: 14-Oct-1940, 73 y.o.   MRN: [id number] HeartMate 2 Rounding Note  Subjective:    Johnny Oneill is a 73 year old patient with ischemic cardiomyopathy with advanced heart failure on the transplant waiting list at [facility] and supported with home milrinone since April 2014. On this admission he was admitted with acute on chronic heart failure was found to be in cardiogenic shock which responded only to increased dose of milrinone and placement of intra-aortic balloon pump. The patient was discussed at the [facility] conference and felt to be an appropriate candidate for LVAD implantation as a bridge to transplantation. Prior to surgery the details of LVAD implantation were thoroughly discussed with patient and his wife including the expected postop recovery in the long-term postoperative care and followup.   Postop day 10 VAD implantation  Intraoperative and postoperative course complicated by friable LV myocardium at the apical cannulation site resulting in bleeding from suture tears in the myocardium repaired by multiple extra pledgeted sutures placed before separating from cardio coronary bypass.   He had a stable night and slept > 4 hrs.  the patient's confusion has improved and sitter stoppedNo PI events in 48hrs.  Remains on Milrinone reduced to .065 mcg.  Co-ox 55% MAP >88  this am - getting PRN iv hydralazine.   He remains weak. Has only been doing 400 on IS. The patient walked 200 feet with physical therapy today  Wt 155lbs, decreased 2 lbs from yesterday- lasix off and pushing nutrition  LVAD INTERROGATION:  HeartMate II LVAD:  Flow 4.2 liters/min, speed 9000, power 5.3, PI 4 .2.    Objective:    Vital Signs:   Temp:  [98 F (36.7 C)-98.3 F (36.8 C)] 98.3 F (36.8 C) (01/29 1300) Pulse Rate:  [89] 89 (01/28 2013) Resp:  [18] 18 (01/28 2013) BP: (84-94)/(0) 84/0 mmHg (01/29 0641) SpO2:  [92 %] 92 % (01/28 2013) Weight:  [155 lb 13.8  oz (70.7 kg)] 155 lb 13.8 oz (70.7 kg) (01/29 0644) Last BM Date: 09/27/13 Mean arterial Pressure 90-100, Max 106 this am  Intake/Output:   Intake/Output Summary (Last 24 hours) at 09/28/13 1908 Last data filed at 09/28/13 1500  Gross per 24 hour  Intake    240 ml  Output    750 ml  Net   -510 ml     Physical Exam: General:  Looks tired HEENT: normal Neck: supple. Carotids 2+ bilat; no bruits. No lymphadenopathy or thryomegaly appreciated. Cor:  LVAD hum present. Difficult to hear native heart sounds Lungs: Decreased breath sounds in lower lobes with crackles Abdomen: soft, nontender, nondistended. No hepatosplenomegaly. No bruits or masses. Good bowel sounds. Extremities: no cyanosis, clubbing, rash, edema Neuro: alert & orientedx3 but mentation a little slow, cranial nerves grossly intact. moves all 4 extremities w/o difficulty. Affect flat  Telemetry: sinus 93  Labs: Basic Metabolic Panel:  Recent Labs Lab 09/22/13 0435  09/23/13 0430 09/24/13 0400 09/25/13 0400 09/26/13 0400 09/27/13 0430 09/28/13 0552  NA 141  < > 141 141 143 146 147 144  K 3.7  < > 3.8 3.8 4.0 4.0 3.9 3.7  CL 103  < > 103 103 106 109 111 109  CO2 24  < > 24 23 23 24 23 25   GLUCOSE 110*  < > 106* 94 100* 107* 88 100*  BUN 49*  < > 47* 46* 41* 35* 29* 23  CREATININE 1.72*  < > 1.57* 1.56* 1.40* 1.35 1.35  1.36*  CALCIUM 8.2*  < > 8.2* 8.1* 8.2* 8.2* 8.2* 7.8*  MG 2.5  --  2.5  --   --   --   --   --   PHOS 3.7  --  3.3  --   --   --   --   --   < > = values in this interval not displayed.  Liver Function Tests:  Recent Labs Lab 09/24/13 0400 09/25/13 0400 09/26/13 0400 09/27/13 0430 09/28/13 0552  AST 108* 101* 78* 60* 53*  ALT 243* 193* 147* 112* 83*  ALKPHOS 132* 121* 112 116 111  BILITOT 1.9* 1.7* 1.4* 1.3* 1.2  PROT 6.2 6.0 6.0 6.0 5.5*  ALBUMIN 2.5* 2.5* 2.5* 2.4* 2.1*   No results found for this basename: LIPASE, AMYLASE,  in the last 168 hours No results found for this  basename: AMMONIA,  in the last 168 hours  CBC:  Recent Labs Lab 09/22/13 0435 09/23/13 0430 09/23/13 1510 09/24/13 0400 09/25/13 0400 09/27/13 1030 09/28/13 0552  WBC 10.4 9.8 9.2 10.5 10.5 10.2 9.9  NEUTROABS 9.4* 8.8*  --  9.4* 9.3*  --   --   HGB 8.7* 8.9* 9.1* 8.9* 8.7* 9.3* 8.8*  HCT 26.0* 27.1* 28.0* 27.2* 27.5* 30.2* 27.7*  MCV 82.5 81.9 82.6 83.4 85.7 87.8 86.8  PLT 141* 158 176 199 235 311 309    INR:  Recent Labs Lab 09/24/13 0400 09/25/13 0400 09/26/13 0400 09/27/13 0430 09/28/13 0552  INR 1.58* 1.58* 2.08* 1.96* 1.99*   LDH 472, down from 516 yesterday  Other results:  EKG:   Imaging: Dg Chest 2 View  09/28/2013   CLINICAL DATA:  CHF status post VAD  EXAM: CHEST  2 VIEW  COMPARISON:  DG CHEST 1V PORT dated 09/27/2013  FINDINGS: Improved inspiration when compared to the previous study. A right-sided central venous catheter is appreciated with tip projecting in the region of the superior vena cava. A left chest wall dual chamber AICD is appreciated tips projecting in the region of the right ventricle and right atrium. A ventricular assist device is appreciated unchanged from prior study. Patient is status post median sternotomy. The cardiac silhouette is enlarged. With technique taking into consideration there has been decrease conspicuity of the interstitial findings. A region of perihilar density projects on the right. Linear areas of discoid atelectasis versus scarring project within the periphery of the right hemithorax. There is blunting of the left costophrenic angle. The osseous structures are unremarkable.  IMPRESSION: With technique taken into consideration decreased conspicuity of the interstitial findings likely reflecting decreased pulmonary edema. A mild right perihilar density is appreciated differential considerations are infiltrate versus atelectasis. Continued surveillance evaluation recommended.   Electronically Signed   By: Margaree Mackintosh M.D.    On: 09/28/2013 13:32   Dg Chest Port 1 View  09/27/2013   CLINICAL DATA:  Followup ventricular assist device.  EXAM: PORTABLE CHEST - 1 VIEW  COMPARISON:  09/26/2013  FINDINGS: There are lower lung volumes on the current exam leading to the appearance of increased vascular prominence and hazy perihilar and lung base opacity. This is consistent with minimal pulmonary edema, but not felt to be significantly changed allowing for the differences in lung volume. There is also mild perihilar and lung base atelectasis.  Left ventricular assist device placement is stable. Right PICC tip lies in the lower superior vena cava, also stable. Left anterior chest wall AICD is stable and well positioned.  No gross pneumothorax  noted on this semi-erect study. There is no mediastinal widening. Cardiac silhouette is mildly enlarged.  IMPRESSION: 1. No significant change from the previous day's study allowing for differences in lung volumes. Minimal persistent pulmonary edema is again suggested, with superimposed perihilar and lung base atelectasis.   Electronically Signed   By: Lajean Manes M.D.   On: 09/27/2013 08:39    CXR today shows bibasilar atelectasis  Medications:     Scheduled Medications: . amiodarone  200 mg Oral Daily  . amLODipine  10 mg Oral Daily  . aspirin EC  325 mg Oral Daily   Or  . aspirin  324 mg Per Tube Daily   Or  . aspirin  300 mg Rectal Daily  . diphenhydrAMINE  25 mg Oral QHS  . ezetimibe  10 mg Oral Daily  . feeding supplement (ENSURE COMPLETE)  237 mL Oral BID BM  . feeding supplement (PRO-STAT SUGAR FREE 64)  30 mL Oral BID WC  . ferrous Q000111Q C-folic acid  1 capsule Oral q morning - 10a  . furosemide  20 mg Oral Daily  . levothyroxine  150 mcg Oral Custom   And  . [START ON 09/29/2013] levothyroxine  75 mcg Oral Custom  . OLANZapine  5 mg Oral Q24H  . pantoprazole  40 mg Oral Q1200  . [START ON 09/29/2013] potassium chloride  20 mEq Oral Once  . sodium  chloride  10-40 mL Intracatheter Q12H  . warfarin  5 mg Oral q1800  . Warfarin - Physician Dosing Inpatient   Does not apply q1800    Infusions: . sodium chloride 20 mL/hr (09/24/13 0237)  . sodium chloride    . milrinone 0.0625 mcg/kg/min (09/28/13 0817)    PRN Medications: sodium chloride, docusate sodium, hydrALAZINE, levalbuterol, menthol-cetylpyridinium, ondansetron (ZOFRAN) IV, potassium chloride, sodium chloride, sodium chloride, traMADol   Assessment:   1 acute on chronic systolic heart failure EF 20% requiring implantable LVAD January 19,015  2 ischemic cardiomyopathy  3 perioperative coagulopathy now improved  4 ventilator dependent respiratory insufficiency due to pulmonary edema, possible ARDS from cardio pulmonary bypass-now resolved and patient is extubated  5 postop delerium, now resolved with better sleep  Plan/Discussion:    He has been very stable on Milrinone 0.125.Will reduce by half today INR at 2.0. Will give 5 mg coumadin today EPW's removed   anterior MT removed- CXR clear  Foley removed but pt had difficulty voiding and foley reinserted with removal of a clot. Since he is weak and not very mobile will leave foley in today. Doing better on stepdown unit- education of VAD system in progress    I reviewed the LVAD parameters from today, and compared the results to the patient's prior recorded data.  No programming changes were made.  The LVAD is functioning within specified parameters.  The patient performs LVAD self-test daily.  LVAD interrogation was negative for any significant power changes, alarms or PI events/speed drops.  LVAD equipment check completed and is in good working order.  Back-up equipment present.   LVAD education done on emergency procedures and precautions and reviewed exit site care.  Length of Stay: 15  VAN TRIGT III,Aloysuis Ribaudo 09/28/2013, 7:08 PM

## 2013-09-28 NOTE — Progress Notes (Signed)
Patient ID: Johnny Oneill, male   DOB: 1940-12-09, 73 y.o.   MRN: [id number] HeartMate 2 Rounding Note  Subjective:    Johnny Oneill is a 73 year old patient with ischemic cardiomyopathy with advanced heart failure on the transplant waiting list at [facility] and supported with home milrinone since April 2014. On this admission he was admitted with acute on chronic heart failure was found to be in cardiogenic shock which responded only to increased dose of milrinone and placement of intra-aortic balloon pump. The patient was discussed at the [facility] conference and felt to be an appropriate candidate for LVAD implantation as a bridge to transplantation. Prior to surgery the details of LVAD implantation were thoroughly discussed with patient and his wife including the expected postop recovery in the long-term postoperative care and followup.   Postop day 10 LVAD implantation   Intraoperative and postoperative course complicated by friable LV myocardium at the apical cannulation site resulting in bleeding from suture tears in the myocardium repaired by multiple extra pledgeted sutures placed before separating from cardio coronary bypass.   Doing well from cardiac standpoint. Delirium improved this am.  Sleeping better.  Weight down 4 lbs this am. Denies dyspnea. Maintaining  SR. Oriented this am, pleasant affect.   Remains on Milrinone 0.125; Co-ox this am 59%  MAP 84 - 92 on Norvasc 5 mg and Hydralazine 25 tid   LVAD INTERROGATION:  HeartMate II LVAD:  Flow 4.1 liters/min, speed 9000, power 4.8, PI 7.2.  No PI events or alarms  Objective:    Vital Signs:   Temp:  [97.5 F (36.4 C)-98 F (36.7 C)] 98 F (36.7 C) (01/29 0554) Pulse Rate:  [89-98] 89 (01/28 2013) Resp:  [18] 18 (01/28 2013) BP: (84-94)/(0) 84/0 mmHg (01/29 0641) SpO2:  [92 %] 92 % (01/28 2013) Weight:  [155 lb 13.8 oz (70.7 kg)] 155 lb 13.8 oz (70.7 kg) (01/29 0644) Last BM Date: 09/26/13 Mean arterial Pressure  90-100, Max 106 this am  Intake/Output:   Intake/Output Summary (Last 24 hours) at 09/28/13 0753 Last data filed at 09/28/13 [id number]  Gross per 24 hour  Intake      0 ml  Output    350 ml  Net   -350 ml     Physical Exam: General: sitting in chair. More interactive HEENT: normal Neck: JVP 9-10 supple. Carotids 2+ bilat; no bruits. No lymphadenopathy or thryomegaly appreciated. Cor:  LVAD hum present. Difficult to hear native heart sounds Lungs: Decreased breath sounds in lower lobes o/w clear Abdomen: soft, nontender, nondistended. No hepatosplenomegaly. No bruits or masses. Good bowel sounds. Extremities: no cyanosis, clubbing, rash, edema Neuro: delirious. Non-focal.   Telemetry: sinus 80-90s  Labs: Basic Metabolic Panel:  Recent Labs Lab 09/22/13 0435  09/23/13 0430 09/24/13 0400 09/25/13 0400 09/26/13 0400 09/27/13 0430  NA 141  < > 141 141 143 146 147  K 3.7  < > 3.8 3.8 4.0 4.0 3.9  CL 103  < > 103 103 106 109 111  CO2 24  < > 24 23 23 24 23   GLUCOSE 110*  < > 106* 94 100* 107* 88  BUN 49*  < > 47* 46* 41* 35* 29*  CREATININE 1.72*  < > 1.57* 1.56* 1.40* 1.35 1.35  CALCIUM 8.2*  < > 8.2* 8.1* 8.2* 8.2* 8.2*  MG 2.5  --  2.5  --   --   --   --   PHOS 3.7  --  3.3  --   --   --   --   < > =  values in this interval not displayed.  Liver Function Tests:  Recent Labs Lab 09/23/13 0430 09/24/13 0400 09/25/13 0400 09/26/13 0400 09/27/13 0430  AST 151* 108* 101* 78* 60*  ALT 305* 243* 193* 147* 112*  ALKPHOS 121* 132* 121* 112 116  BILITOT 2.4* 1.9* 1.7* 1.4* 1.3*  PROT 6.1 6.2 6.0 6.0 6.0  ALBUMIN 2.5* 2.5* 2.5* 2.5* 2.4*   No results found for this basename: LIPASE, AMYLASE,  in the last 168 hours No results found for this basename: AMMONIA,  in the last 168 hours  CBC:  Recent Labs Lab 09/22/13 0435 09/23/13 0430 09/23/13 1510 09/24/13 0400 09/25/13 0400 09/27/13 1030 09/28/13 0552  WBC 10.4 9.8 9.2 10.5 10.5 10.2 9.9  NEUTROABS 9.4* 8.8*   --  9.4* 9.3*  --   --   HGB 8.7* 8.9* 9.1* 8.9* 8.7* 9.3* 8.8*  HCT 26.0* 27.1* 28.0* 27.2* 27.5* 30.2* 27.7*  MCV 82.5 81.9 82.6 83.4 85.7 87.8 86.8  PLT 141* 158 176 199 235 311 309    INR:  1.99  Recent Labs Lab 09/24/13 0400 09/25/13 0400 09/26/13 0400 09/27/13 0430 09/28/13 0552  INR 1.58* 1.58* 2.08* 1.96* 1.99*   LDH 766>760>744>557>516>472>458>442>507>pending  Co-ox:  57>62>60>66>65>61>60>51>83>59  Other results:  EKG:   Imaging: Dg Chest Port 1 View  09/27/2013   CLINICAL DATA:  Followup ventricular assist device.  EXAM: PORTABLE CHEST - 1 VIEW  COMPARISON:  09/26/2013  FINDINGS: There are lower lung volumes on the current exam leading to the appearance of increased vascular prominence and hazy perihilar and lung base opacity. This is consistent with minimal pulmonary edema, but not felt to be significantly changed allowing for the differences in lung volume. There is also mild perihilar and lung base atelectasis.  Left ventricular assist device placement is stable. Right PICC tip lies in the lower superior vena cava, also stable. Left anterior chest wall AICD is stable and well positioned.  No gross pneumothorax noted on this semi-erect study. There is no mediastinal widening. Cardiac silhouette is mildly enlarged.  IMPRESSION: 1. No significant change from the previous day's study allowing for differences in lung volumes. Minimal persistent pulmonary edema is again suggested, with superimposed perihilar and lung base atelectasis.   Electronically Signed   By: Lajean Manes M.D.   On: 09/27/2013 08:39    CXR today shows bibasilar atelectasis  Medications:     Scheduled Medications: . amiodarone  200 mg Oral Daily  . amLODipine  5 mg Oral Daily  . aspirin EC  325 mg Oral Daily   Or  . aspirin  324 mg Per Tube Daily   Or  . aspirin  300 mg Rectal Daily  . diphenhydrAMINE  25 mg Oral QHS  . ezetimibe  10 mg Oral Daily  . feeding supplement (ENSURE COMPLETE)  237  mL Oral BID BM  . feeding supplement (PRO-STAT SUGAR FREE 64)  30 mL Oral BID WC  . ferrous Q000111Q C-folic acid  1 capsule Oral q morning - 10a  . hydrALAZINE  25 mg Oral Q8H  . levothyroxine  150 mcg Oral Custom   And  . [START ON 09/29/2013] levothyroxine  75 mcg Oral Custom  . OLANZapine  5 mg Oral Q24H  . pantoprazole  40 mg Oral Q1200  . sodium chloride  10-40 mL Intracatheter Q12H  . warfarin  5 mg Oral q1800  . Warfarin - Physician Dosing Inpatient   Does not apply q1800    Infusions: . sodium  chloride 20 mL/hr (09/24/13 0237)  . sodium chloride    . milrinone 0.125 mcg/kg/min (09/27/13 2233)    PRN Medications: sodium chloride, docusate sodium, hydrALAZINE, levalbuterol, menthol-cetylpyridinium, ondansetron (ZOFRAN) IV, potassium chloride, sodium chloride, sodium chloride, traMADol   Assessment:   1. 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 Acute on chronic renal failure, stage III - improved 4. Acute blood loss anemia, expected  - total 4 PRBCs, 1 FFP  5. Persistent fever: resolved, off antibiotics.  6. Acute delirium   Plan/Discussion:    POD # 10.  Stable from cardiac standpoint.  Delirium improving slowly. Continue Zyprexa. Use Benadryl to help sleep as needed (uses this at home). Diuresed well.   Will cut milrinone to 0.0625 and follow. Will stop hydralazine and increased Norvasc to 10.   Maintaining SR. Continue amio 200 daily. Off lasix currently. HTN improved with amlodipine and hydralazine  Continue to ambulate and work with PT as tolerated. .   Continue coumadin per Pharmacy.  I reviewed the LVAD parameters from today, and compared the results to the patient's prior recorded data. No programming changes were made. The LVAD is functioning within specified parameters. The patient performs LVAD self-test daily. LVAD interrogation was negative for any significant power changes, alarms or PI events/speed  drops. LVAD equipment check completed and is in good working order. Back-up equipment present. LVAD education done on emergency procedures and precautions and reviewed exit site care.   Length of Stay: 15  Glori Bickers MD 09/28/2013, 7:53 AM

## 2013-09-28 NOTE — Progress Notes (Signed)
VAD patient discharge binder delivered to room; reviewed contents with wife.  VAD dressing change performed with wife; she is very anxious, but is doing well with sterile technique. VAD dressing removed and site care performed using sterile technique. Drive line exit site cleaned with Chlora prep applicators x 2, allowed to dry, and gauze dressing with aquacel strip re-applied.  Exit site erythematous under aquacel strip with bloody drainage; no tenderness or foul odor noted; two sutures intact. The velour is fully implanted at exit site and driveline anchor present and accurately applied.   Discharge teaching with pt, wife, and neighbor scheduled tomorrow afternoon. Wife will complete reading of HM II Patient Handbook.   Zada Girt, [place] Coordinator

## 2013-09-28 NOTE — Progress Notes (Signed)
CARDIAC REHAB PHASE I   PRE:  Rate/Rhythm: 94   BP:  Supine:   Sitting: 94/ dopplered  Standing:    SaO2: 95 RA  MODE:  Ambulation: 150 ft   POST:  Rate/Rhythm: 98  BP:  Supine:   Sitting: 88/ dopplered  Standing:    SaO2: 98 RA [id number] On arrival pt up on side of bed. He states that we can not rush him. " I have got to rest before we walk." Prepared pt for walk, visitor present. Pt wanted to visit. We gave him some time to rest and visit. Assisted X 2 and used walker to ambulate. Pt weak and tires easily. He took one standing and one sitting rest stop. We pushed w/c behind him for rest stop. Pt to chair after walk with sitter present.He was able to switch from cable to batteries with assistance. Pt is not confident with working with equipment.  Rodney Langton RN 09/28/2013 11:21 AM

## 2013-09-28 NOTE — Progress Notes (Signed)
Physical Therapy Treatment Patient Details Name: Johnny Oneill MRN: [id number] DOB: 11-Sep-1940 Today's Date: 09/28/2013 Time: [id number] PT Time Calculation (min): 55 min  PT Assessment / Plan / Recommendation  History of Present Illness Johnny Oneill is a 73 year old male with history of CAD s/p stent to LCX 2004 with re-occlusion of LCX with lateral MI 2009 and repeat stenting, total occlusion of RCA, 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 S/P L TKR 2013. Last Cath 2009..  [name]   PT Comments   Mentation back to baseline.  Pt completing battery change over with minimal cues.  Response during ambulation is very good.  Gait much steadier, but still needs RW short time more.  Pt's wife participating throughout session and seems comfortable with most facets of the process.   Follow Up Recommendations  Home health PT;Supervision/Assistance - 24 hour;Supervision for mobility/OOB     Does the patient have the potential to tolerate intense rehabilitation     Barriers to Discharge        Equipment Recommendations   (TBD before d/c)    Recommendations for Other Services    Frequency Min 5X/week   Progress towards PT Goals Progress towards PT goals: Progressing toward goals  Plan Current plan remains appropriate    Precautions / Restrictions Precautions Precautions: Sternal;Other (comment) Precaution Comments: LVAD equipment Restrictions Other Position/Activity Restrictions: Sternal precautions   Pertinent Vitals/Pain See above.    Mobility  Bed Mobility Overal bed mobility: Needs Assistance Bed Mobility: Sit to Sidelying Sit to sidelying: Supervision Transfers Overall transfer level: Needs assistance Equipment used: Rolling walker (2 wheeled) (LVAD black bag) Transfers: Sit to/from Stand Sit to Stand: Min guard General transfer comment: reinforcing hand  placement Ambulation/Gait Ambulation/Gait assistance: Min guard Ambulation Distance (Feet): 280 Feet Assistive device: Rolling walker (2 wheeled) Gait Pattern/deviations: Step-through pattern;Trunk flexed Gait velocity: showed ability to change speed appreciably Gait velocity interpretation: Below normal speed for age/gender General Gait Details: Steadiness improving.  Still fatiguing and wandering    Exercises     PT Diagnosis:    PT Problem List:   PT Treatment Interventions:     PT Goals (current goals can now be found in the care plan section) Acute Rehab PT Goals PT Goal Formulation: With patient Time For Goal Achievement: 10/04/13 Potential to Achieve Goals: Good  Visit Information  Last PT Received On: 09/28/13 Assistance Needed: +2 PT/OT/SLP Co-Evaluation/Treatment: Yes Reason for Co-Treatment: Complexity of the patient's impairments (multi-system involvement) PT goals addressed during session: Mobility/safety with mobility OT goals addressed during session: ADL's and self-care History of Present Illness: Johnny Oneill is a 73 year old male with history of CAD s/p stent to LCX 2004 with re-occlusion of LCX with lateral MI 2009 and repeat stenting, total occlusion of RCA, 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 S/P L TKR 2013. Last Cath 2009..  [name]    Subjective Data  Subjective: I'm back... (pt commenting on getting his humor/personality back.)   Cognition  Cognition Arousal/Alertness: Awake/alert Behavior During Therapy: WFL for tasks assessed/performed Overall Cognitive Status: Within Functional Limits for tasks assessed Following Commands: Follows one step commands with increased time;Follows one step commands consistently General Comments: Pt appears back to baseline     Balance  Balance Overall balance assessment: Needs assistance Sitting balance -  Comments: Now reaching  to the floor (in sitting) to get socks, etc.  pulling off and putting on socks, reaching for/handing off objects with good dynamic balance Standing balance support: Bilateral upper extremity supported;Single extremity supported Standing balance-Leahy Scale: Good  End of Session PT - End of Session Activity Tolerance: Patient tolerated treatment well Patient left: in bed;with call bell/phone within reach;with family/visitor present Nurse Communication: Mobility status   GP     Johnny Oneill, Tessie Fass 09/28/2013, 5:14 PM

## 2013-09-29 LAB — CARBOXYHEMOGLOBIN
Carboxyhemoglobin: 2.3 % — ABNORMAL HIGH (ref 0.5–1.5)
Carboxyhemoglobin: 2.7 % — ABNORMAL HIGH (ref 0.5–1.5)
Methemoglobin: 0.9 % (ref 0.0–1.5)
Methemoglobin: 1.7 % — ABNORMAL HIGH (ref 0.0–1.5)
O2 Saturation: 44.5 %
O2 Saturation: 54.4 %
Total hemoglobin: 9.6 g/dL — ABNORMAL LOW (ref 13.5–18.0)
Total hemoglobin: 9.1 g/dL — ABNORMAL LOW (ref 13.5–18.0)

## 2013-09-29 LAB — COMPREHENSIVE METABOLIC PANEL
ALT: 81 U/L — ABNORMAL HIGH (ref 0–53)
AST: 63 U/L — ABNORMAL HIGH (ref 0–37)
Albumin: 2.2 g/dL — ABNORMAL LOW (ref 3.5–5.2)
Alkaline Phosphatase: 119 U/L — ABNORMAL HIGH (ref 39–117)
BUN: 21 mg/dL (ref 6–23)
CO2: 23 mEq/L (ref 19–32)
Calcium: 8 mg/dL — ABNORMAL LOW (ref 8.4–10.5)
Chloride: 107 mEq/L (ref 96–112)
Creatinine, Ser: 1.33 mg/dL (ref 0.50–1.35)
GFR calc Af Amer: 61 mL/min — ABNORMAL LOW (ref >90)
GFR calc non Af Amer: 53 mL/min — ABNORMAL LOW (ref >90)
Glucose, Bld: 93 mg/dL (ref 70–99)
Potassium: 4.3 mEq/L (ref 3.7–5.3)
Sodium: 143 mEq/L (ref 137–147)
Total Bilirubin: 1.3 mg/dL — ABNORMAL HIGH (ref 0.3–1.2)
Total Protein: 6 g/dL (ref 6.0–8.3)

## 2013-09-29 LAB — GLUCOSE, CAPILLARY
Glucose-Capillary: 83 mg/dL (ref 70–99)
Glucose-Capillary: 110 mg/dL — ABNORMAL HIGH (ref 70–99)

## 2013-09-29 LAB — CBC
HCT: 30 % — ABNORMAL LOW (ref 39.0–52.0)
Hemoglobin: 9.3 g/dL — ABNORMAL LOW (ref 13.0–17.0)
MCH: 27.3 pg (ref 26.0–34.0)
MCHC: 31 g/dL (ref 30.0–36.0)
MCV: 88 fL (ref 78.0–100.0)
Platelets: 331 10*3/uL (ref 150–400)
RBC: 3.41 MIL/uL — ABNORMAL LOW (ref 4.22–5.81)
RDW: 19.7 % — ABNORMAL HIGH (ref 11.5–15.5)
WBC: 12.4 10*3/uL — ABNORMAL HIGH (ref 4.0–10.5)

## 2013-09-29 LAB — PROTIME-INR
INR: 1.94 — ABNORMAL HIGH (ref 0.00–1.49)
Prothrombin Time: 21.6 seconds — ABNORMAL HIGH (ref 11.6–15.2)

## 2013-09-29 LAB — LACTATE DEHYDROGENASE: LDH: 495 U/L — ABNORMAL HIGH (ref 94–250)

## 2013-09-29 MED ORDER — MILRINONE IN DEXTROSE 20 MG/100ML IV SOLN
0.0620 ug/kg/min | INTRAVENOUS | Status: DC
  Administered 2013-09-30: 0.125 ug/kg/min via INTRAVENOUS
  Administered 2013-10-01: 0.062 ug/kg/min via INTRAVENOUS
  Filled 2013-09-29 (×2): qty 100

## 2013-09-29 NOTE — Progress Notes (Signed)
EPW removed per protocol. Ends intact. Pt tolerated well. MAP 88. Pt instructed to stay in bed for one hour. Verbalized understanding.  Will continue to monitor pt closely.

## 2013-09-29 NOTE — Progress Notes (Signed)
  VAD coordinator observed wife perform VAD drive line dressing change.  The exit site has more dark serous drainage than yesterday with no foul odor or tenderness noted; site remains erythematous under aquacel strip. One suture removed; one suture intact. Wife doing well with dressing change; requests to do daily under nursing supervision; asked nurse to allow.

## 2013-09-29 NOTE — Progress Notes (Signed)
PT Cancellation Note  Patient Details Name: RIKI GLADIN MRN: [id number] DOB: March 22, 1941   Cancelled Treatment:    Reason Eval/Treat Not Completed: Patient at procedure or test/unavailable 09/29/2013  Donnella Sham, PT [phone]  (pager)  Bayler Nehring, Tessie Fass 09/29/2013, 5:06 PM

## 2013-09-29 NOTE — Progress Notes (Signed)
NUTRITION FOLLOW UP  Intervention:   Continue Ensure Complete po BID, each supplement provides 350 kcal and 13 grams of protein Continue Prostat liquid protein po 30 ml BID, each supplement provides 100 kcal, 15 grams protein RD to follow for nutrition care plan  Nutrition Dx:   Inadequate oral intake now related to poor appetite as evidenced by PO intake 10-15%, improved  Goal:   Pt to meet >/= 90% of their estimated nutrition needs, progressing  Monitor:   PO & supplemental intake, weight, labs, I/O's  Assessment:   Patient with PMH of CAD, chronic systolic heart failure, ICD 2009, PVCs, hypothyroidism, hyperlipidemia, and arthritis; admitted for ICM and LVAD work up.   Patient s/p procedure 1/19:  HeartMate II LVAD IMPLANTATION   Patient transferred to 2W-Cardiac from 2S-Surgical ICU 1/27.  Patient walking with Cardiac Rehab.  Spoke with Student RN who reports patient was hungry this AM; ate fairly well.  PO intake variable at 25-50% per flowsheet records.  Drinking some of his Ensure Complete supplements.  Post-op delirium resolved.  Height: Ht Readings from Last 1 Encounters:  09/17/13 5\' 7"  (1.702 m)    Weight Status ---> trending down, + diuresis  Wt Readings from Last 1 Encounters:  09/29/13 158 lb (71.668 kg)    01/29  155 lb 01/28  159 lb 01/27  155 lb 01/26  158 lb 01/25  158 lb 01/24  160 lb 01/23  162 lb 01/22  167 lb 01/21  171 lb 01/20  174 lb  Re-estimated needs:  Kcal: 2000-2200 Protein: 110-120 gm Fluid: 2.0-2.2 L  Skin: sternal surgical incision   Diet Order: Cardiac   Intake/Output Summary (Last 24 hours) at 09/29/13 1141 Last data filed at 09/28/13 1900  Gross per 24 hour  Intake    240 ml  Output    400 ml  Net   -160 ml    Labs:   Recent Labs Lab 09/23/13 0430  09/27/13 0430 09/28/13 0552 09/29/13 0500  NA 141  < > 147 144 143  K 3.8  < > 3.9 3.7 4.3  CL 103  < > 111 109 107  CO2 24  < > 23 25 23   BUN 47*  < > 29* 23  21  CREATININE 1.57*  < > 1.35 1.36* 1.33  CALCIUM 8.2*  < > 8.2* 7.8* 8.0*  MG 2.5  --   --   --   --   PHOS 3.3  --   --   --   --   GLUCOSE 106*  < > 88 100* 93  < > = values in this interval not displayed.  CBG (last 3)   Recent Labs  09/28/13 2047 09/29/13 0628 09/29/13 1117  GLUCAP 101* 83 110*    Scheduled Meds: . amiodarone  200 mg Oral Daily  . amLODipine  10 mg Oral Daily  . aspirin EC  325 mg Oral Daily   Or  . aspirin  324 mg Per Tube Daily   Or  . aspirin  300 mg Rectal Daily  . diphenhydrAMINE  25 mg Oral QHS  . ezetimibe  10 mg Oral Daily  . feeding supplement (ENSURE COMPLETE)  237 mL Oral BID BM  . feeding supplement (PRO-STAT SUGAR FREE 64)  30 mL Oral BID WC  . ferrous Q000111Q C-folic acid  1 capsule Oral q morning - 10a  . furosemide  20 mg Oral Daily  . levothyroxine  150 mcg Oral Custom  And  . levothyroxine  75 mcg Oral Custom  . OLANZapine  5 mg Oral Q24H  . pantoprazole  40 mg Oral Q1200  . sodium chloride  10-40 mL Intracatheter Q12H  . warfarin  5 mg Oral q1800  . Warfarin - Physician Dosing Inpatient   Does not apply q1800    Continuous Infusions: . sodium chloride 20 mL/hr (09/24/13 0237)  . sodium chloride    . milrinone 0.0625 mcg/kg/min (09/28/13 0817)    Arthur Holms, RD, LDN Pager #: [phone] After-Hours Pager #: [phone]

## 2013-09-29 NOTE — Progress Notes (Signed)
CSW met with patient at bedside. He reports that he "had the best night's sleep in 3 weeks". Patient stated that previous nights he would stare at the ceiling most of the night. Patient reports "my throat is a little scratchy this morning from all the talking I did yesterday" "my wife even stated- your'e back!". Patient appears pleased with his progress and was preparing to get out of bed to use the bathroom and ambulate with the cardiac rehab staff. CSW provided encouragement and support. CSW will continue to follow throughout recovery. Raquel Sarna, [place]

## 2013-09-29 NOTE — Progress Notes (Signed)
Patient ID: Johnny Oneill, male   DOB: 03-03-1941, 73 y.o.   MRN: [id number] HeartMate 2 Rounding Note  Subjective:    Johnny Oneill is a 73 year old patient with ischemic cardiomyopathy with advanced heart failure on the transplant waiting list at [facility] and supported with home milrinone since April 2014. On this admission he was admitted with acute on chronic heart failure was found to be in cardiogenic shock which responded only to increased dose of milrinone and placement of intra-aortic balloon pump. The patient was discussed at the [facility] conference and felt to be an appropriate candidate for LVAD implantation as a bridge to transplantation. Prior to surgery the details of LVAD implantation were thoroughly discussed with patient and his wife including the expected postop recovery in the long-term postoperative care and followup.   Postop day 11 LVAD implantation   Intraoperative and postoperative course complicated by friable LV myocardium at the apical cannulation site resulting in bleeding from suture tears in the myocardium repaired by multiple extra pledgeted sutures placed before separating from cardio coronary bypass.   Doing well from cardiac standpoint. Delirium resolved. Sleeping better.  Weight up 3 lbs this am. Back on oral lasix.  Denies dyspnea. Maintaining SR. Oriented this am, pleasant affect.   Milrinone  Weaned to .062 yesterday; Co-ox pending this am  MAP 90 - 98 on Norvasc 10 mg   WBC trending up 12.4; CXR yesterday showed bibasilar atx   LDH 766>760>744>557>516>472>458>442>507>424>495  Co-ox:  57>62>60>66>65>61>60>51>83>59>pending  LVAD INTERROGATION:  HeartMate II LVAD:  Flow 3.9 liters/min, speed 9000, power 4.6, PI 5.2.   Rare PI event  Objective:    Vital Signs:   Temp:  [97.4 F (36.3 C)-98.5 F (36.9 C)] 98.5 F (36.9 C) (01/30 0520) SpO2:  [92 %] 92 % (01/29 1900) Weight:  [71.668 kg (158 lb)] 71.668 kg (158 lb) (01/30 0520) Last BM Date:  09/27/13  Mean arterial Pressure 90 - 98  Intake/Output:   Intake/Output Summary (Last 24 hours) at 09/29/13 0819 Last data filed at 09/28/13 1900  Gross per 24 hour  Intake    240 ml  Output    400 ml  Net   -160 ml     Physical Exam: General: lying in bed More interactive HEENT: normal Neck: JVP 9-10 supple. Carotids 2+ bilat; no bruits. No lymphadenopathy or thryomegaly appreciated. Cor:  LVAD hum present. Difficult to hear native heart sounds Lungs: Decreased breath sounds in lower lobes o/w clear Abdomen: soft, nontender, nondistended. No hepatosplenomegaly. No bruits or masses. Good bowel sounds. Extremities: no cyanosis, clubbing, rash, edema Neuro: delirious. Non-focal.   Telemetry: sinus 80-90s  Labs: Basic Metabolic Panel:  Recent Labs Lab 09/23/13 0430  09/25/13 0400 09/26/13 0400 09/27/13 0430 09/28/13 0552 09/29/13 0500  NA 141  < > 143 146 147 144 143  K 3.8  < > 4.0 4.0 3.9 3.7 4.3  CL 103  < > 106 109 111 109 107  CO2 24  < > 23 24 23 25 23   GLUCOSE 106*  < > 100* 107* 88 100* 93  BUN 47*  < > 41* 35* 29* 23 21  CREATININE 1.57*  < > 1.40* 1.35 1.35 1.36* 1.33  CALCIUM 8.2*  < > 8.2* 8.2* 8.2* 7.8* 8.0*  MG 2.5  --   --   --   --   --   --   PHOS 3.3  --   --   --   --   --   --   < > =  values in this interval not displayed.  Liver Function Tests:  Recent Labs Lab 09/25/13 0400 09/26/13 0400 09/27/13 0430 09/28/13 0552 09/29/13 0500  AST 101* 78* 60* 53* 63*  ALT 193* 147* 112* 83* 81*  ALKPHOS 121* 112 116 111 119*  BILITOT 1.7* 1.4* 1.3* 1.2 1.3*  PROT 6.0 6.0 6.0 5.5* 6.0  ALBUMIN 2.5* 2.5* 2.4* 2.1* 2.2*   No results found for this basename: LIPASE, AMYLASE,  in the last 168 hours No results found for this basename: AMMONIA,  in the last 168 hours  CBC:  Recent Labs Lab 09/23/13 0430  09/24/13 0400 09/25/13 0400 09/27/13 1030 09/28/13 0552 09/29/13 0500  WBC 9.8  < > 10.5 10.5 10.2 9.9 12.4*  NEUTROABS 8.8*  --  9.4*  9.3*  --   --   --   HGB 8.9*  < > 8.9* 8.7* 9.3* 8.8* 9.3*  HCT 27.1*  < > 27.2* 27.5* 30.2* 27.7* 30.0*  MCV 81.9  < > 83.4 85.7 87.8 86.8 88.0  PLT 158  < > 199 235 311 309 331  < > = values in this interval not displayed.  INR:  1.94  Recent Labs Lab 09/25/13 0400 09/26/13 0400 09/27/13 0430 09/28/13 0552 09/29/13 0500  INR 1.58* 2.08* 1.96* 1.99* 1.94*     Other results:    Imaging: Dg Chest 2 View  09/28/2013   CLINICAL DATA:  CHF status post VAD  EXAM: CHEST  2 VIEW  COMPARISON:  DG CHEST 1V PORT dated 09/27/2013  FINDINGS: Improved inspiration when compared to the previous study. A right-sided central venous catheter is appreciated with tip projecting in the region of the superior vena cava. A left chest wall dual chamber AICD is appreciated tips projecting in the region of the right ventricle and right atrium. A ventricular assist device is appreciated unchanged from prior study. Patient is status post median sternotomy. The cardiac silhouette is enlarged. With technique taking into consideration there has been decrease conspicuity of the interstitial findings. A region of perihilar density projects on the right. Linear areas of discoid atelectasis versus scarring project within the periphery of the right hemithorax. There is blunting of the left costophrenic angle. The osseous structures are unremarkable.  IMPRESSION: With technique taken into consideration decreased conspicuity of the interstitial findings likely reflecting decreased pulmonary edema. A mild right perihilar density is appreciated differential considerations are infiltrate versus atelectasis. Continued surveillance evaluation recommended.   Electronically Signed   By: Margaree Mackintosh M.D.   On: 09/28/2013 13:32    CXR today shows bibasilar atelectasis  Medications:     Scheduled Medications: . amiodarone  200 mg Oral Daily  . amLODipine  10 mg Oral Daily  . aspirin EC  325 mg Oral Daily   Or  . aspirin   324 mg Per Tube Daily   Or  . aspirin  300 mg Rectal Daily  . diphenhydrAMINE  25 mg Oral QHS  . ezetimibe  10 mg Oral Daily  . feeding supplement (ENSURE COMPLETE)  237 mL Oral BID BM  . feeding supplement (PRO-STAT SUGAR FREE 64)  30 mL Oral BID WC  . ferrous Q000111Q C-folic acid  1 capsule Oral q morning - 10a  . furosemide  20 mg Oral Daily  . levothyroxine  150 mcg Oral Custom   And  . levothyroxine  75 mcg Oral Custom  . OLANZapine  5 mg Oral Q24H  . pantoprazole  40 mg Oral Q1200  . potassium  chloride  20 mEq Oral Once  . sodium chloride  10-40 mL Intracatheter Q12H  . warfarin  5 mg Oral q1800  . Warfarin - Physician Dosing Inpatient   Does not apply q1800    Infusions: . sodium chloride 20 mL/hr (09/24/13 0237)  . sodium chloride    . milrinone 0.0625 mcg/kg/min (09/28/13 0817)    PRN Medications: sodium chloride, docusate sodium, hydrALAZINE, levalbuterol, menthol-cetylpyridinium, ondansetron (ZOFRAN) IV, potassium chloride, sodium chloride, sodium chloride, traMADol   Assessment:   1. 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 Acute on chronic renal failure, stage III - improved 4. Acute blood loss anemia, expected  - total 4 PRBCs, 1 FFP  5. Persistent fever: resolved, off antibiotics.  6. Acute delirium   Plan/Discussion:    POD # 11.  Stable from cardiac standpoint.  Delirium resolved. Likely can stop zyprexa today or tomorrow. Use Benadryl to help sleep as needed (uses this at home).   Await co-ox if 55% or greater will stop milrinone.   MAPs slightly elevated on Norvasc 10 may need to add a little ACE/ARB.   Maintaining SR. Continue amio 200 daily.  Volume status ok on low dose oral lasix. Will continue.   WBC trending up. Foley out. (UA ok). Continue IS. Watch closely. Driveline ok.   Continue to ambulate and work with PT as tolerated.   Continue coumadin per Pharmacy.  I reviewed the LVAD  parameters from today, and compared the results to the patient's prior recorded data. No programming changes were made. The LVAD is functioning within specified parameters. The patient performs LVAD self-test daily. LVAD interrogation was negative for any significant power changes, alarms or PI events/speed drops. LVAD equipment check completed and is in good working order. Back-up equipment present. LVAD education done on emergency procedures and precautions and reviewed exit site care.   Length of Stay: 16  Glori Bickers MD 09/29/2013, 8:19 AM

## 2013-09-29 NOTE — Progress Notes (Signed)
Discharge VAD teaching completed with patient and caregivers: [name] (wife) and [name] (neighbor and close friend).  The patient's family has completed a home inspection checklist, this has been reviewed and no unsafe conditions have been identified.  Family has ensured two dedicated grounded, 3-prong outlets with clearly labeled circuit breaker has been established in the bedroom for power module and Charity fundraiser.  Both patient and caregivers have been trained on the following:  ____ Overview of major warnings and cautions  ____ HM II LVAD overview of system operation  ____    Overview of system components (features and functions) ____ Changing power source ____ Overview of alerts and alarms ____ How to identify an emergency ____ How to handle an emergency including when the pump is running and when the pump has stopped  ____ Changing system controller ____ Maintain emergency contact list  The patient and caregivers have successfully demonstrated:  ____ Changing power source (from batteries to power module, power module to  batteries, and replacing batteries) ____ Perform system controller self test  ____ Perform power module self test  ____ Check and charge batteries  ____ Change system controller  A daily flow sheet with patient  weight, temperature,  flow, speed, power, and PI, along with daily self checks on system controller and power module have been performed by patient and caregiver(s) during hospitalization and will also be done daily at home.   The caregiver has been trained on percutaneous lead exit site care and dressing changes and has performed dressing changes during patient's hospitalization under nursing supervision. The importance of lead immobilization has been stressed to patient and caregiver using the attachment device. The caregiver has successfully demonstrated the following: ____ Cleansing ____ Dressing ____ Immobilizing lead  The following routine  activities and maintenance have been reviewed with patient and caregiver(s) and both verbalize understanding:  ____ Stressed importance of never disconnecting power from both controller power leads at the same time, and never disconnecting both batteries at the same time, or the pump will stop ____ Plug the power module (PM) or the universal Charity fundraiser (UBC) into properly grounded (3 prong) outlets dedicated to PM use. Do NOT use adapter (cheater plug) for ungrounded outlets or multiple portable socket outlets (power strips) ____ Do not connect the PM or UBC to an outlet controlled by wall switch or the device may not work ____ The PM's internal battery (that provides limited backup power to the LVAD in the event of AC mains power failure) remains charged as long as the PM is connected to [facility] or DC power ____ The PM's internal battery provides approximately 30 minutes of emergency backup power for the LVAD ____ Transfer from PM to batteries during [facility] mains power failure. The PM has internal backup battery that will power the pump while you transfer to batteries ____ Keep a backup system controller, charged batteries, battery clips, and             flashlight near you during sleep in case of electrical power outage ____ Clean battery, battery clip, and universal battery charger contacts weekly ____ Visually inspect percutaneous lead daily ____ Check cables and connectors when changing power source  ____    Rotate batteries; keep all eight batteries charged ____ Always have backup system controller, battery clips, fully charged batteries, and spare fully charged batteries when traveling ____ Re-calibrate batteries every 70 uses; monitor battery life of 36 months or 360 uses; replace batteries at end of battery life  Identified the following changes in activities of daily living with pump:  ____ No driving for at least six weeks and then only if doctor gives permission to do so ____ No tub  baths while pump implanted, and shower only if doctor gives permission ____    No swimming or submersion in water while implanted with pump ____    Keep all VAD equipment away from water or moisture ____ Keep all VAD connections clean and dry ____ No contact sports or engage in jumping activities ____ Avoid strong static electricity (touching TV/computer screens, vacuuming) ____ Never have an MRI while implanted with the pump ____ Never leave or store batteries in extremely hot or cold places (such as   trunk of your car), or the battery life will be shortened ____ Call the doctor or hospital contact person if any change in how the pump            sounds, feels, or works ____ Plan to sleep only when connected to the power module. ____    Keep a backup system controller, charged batteries, battery clips, and             flashlight near you during sleep in case of electrical power outage ____ Do not sleep on your stomach ____ Talk with doctor before any long distance travel plans ____ Patient will need antibiotics prior to any dental procedure; instructed to contact VAD coordinator before any dental procedures (including routine cleaning)   Discharge binder given to patient and include the following:  ____ Discharge instructions sheet ____ List of emergency contacts ____ Vanna [place] card ____    HM II Luggage tags ____ Guide to Percutaneous Lead Care and Equipment Maintenance ____ HM II Alarms for Patients and their Caregivers ____ HM II Patient Handbook ____ HM II Patient Education Program DVD ____ HM II Information and Emergency Assistance Guide ____ HM II Power Module Alarms, Care, Maintenance ____ Daily diary sheets ____    [facility] VAD Patient Education Booklet ____    Care of the Percutaneous Lead  Discharge equipment includes:  ____ Two system controllers with preset: Fixed speed of    9000 RPM Low speed limit  8400 RPM ____ One power module (PM) with 20' patient  cable ____ One universal Charity fundraiser (UBC) ____ Eight fully charged batteries ____ Four battery clips ____ One travel case ____ One holster vest   Discussed frequency and importance of INR checks; emphasized importance of maintaining INR goal to prevent clotting and or bleeding issues with pump.  Patient will have INR managed per VAD team; current INR goal is 2 - 3.     Based on information received from [facility] II manufacturer ([employer]) about reported difficulties that have occurred with some patients and caregivers when changing from primary to back up pocket controller, the HM II LVAD addendum document [id number] was given and reviewed with pt and caregiver (wife).  Additional instructions were provided as follows:        1. Patient should have a caregiver present during system controller exchange or call     911 and VAD pager before attempting to change controller if alone. 2. As part of the weekly safety checklist, the patient and caregiver should review and      understand the steps and instructions involved with replacing the system controller.  3. As part of monthly safety checklist, the patient and caregiver should review and  understand the system controller alarms and how to resolve them.  4. Updated copy of HM II Guide to Replacing the Running System Controller with the              Backup Controller for Patients and Their Caregiveriven reviewed and given to the                 patient.  5. HM II Home Flowsheets given that include the weekly and monthly checks as      noted above.  The patient has completed a proficiency test for the HM II and all questions have been answered. The pt and family have been instructed to call if any questions, problems, or concerns arise. Pt and caregiver successfully paged VAD coordinator using VAD pager emergency number and have been instructed to use this number only for emergencies. Patient and caregivers asked appropriate  questions, had good interaction with VAD coordinator, and verbalized understanding of above instructions.

## 2013-09-29 NOTE — Progress Notes (Signed)
CARDIAC REHAB PHASE I   PRE:  Rate/Rhythm: 90 SR    BP: sitting 90    SaO2: 90-92 RA  MODE:  Ambulation: 440 ft   POST:  Rate/Rhythm: 97 SR    BP: sitting 88     SaO2: 90 RA  Pt sts he feels better today. Moving well, independent getting OOB and to BR (assist with cords). Pt had BM then donned batteries while sitting with little guidance. Ambulated with RW, no assist needed (x2 assist for supervision and equipment, followed with chair). Pt rested every appr. 100 ft due to SOB. Did not need to sit. Sts he "needs to stop talking and focus on his breathing". After walk pt needed assist to transfer back to power cord. Disconnected battery from clip before he disconnected cord. ? If tired or distracted. Return to bed. Pt pleased with his progress. SaO2 was lower today. Continues to need reminders for sternal precautions. Stated his chest was sore after walk. Will f/u. [id number]  Josephina Shih [place] CES, ACSM 09/29/2013 11:20 AM

## 2013-09-29 NOTE — Progress Notes (Signed)
OT Cancellation Note  Patient Details Name: Johnny Oneill MRN: [id number] DOB: 08-Nov-1940   Cancelled Treatment:    Reason Eval/Treat Not Completed: Patient at procedure or test/ unavailable   Johnny Oneill [id number] 09/29/2013, 5:10 PM

## 2013-09-29 NOTE — Progress Notes (Signed)
Patient ID: Johnny Oneill, male   DOB: 07-09-1941, 73 y.o.   MRN: [id number] HeartMate 2 Rounding Note  Subjective:    Johnny Oneill is a 73 year old patient with ischemic cardiomyopathy with advanced heart failure on the transplant waiting list at [facility] and supported with home milrinone since April 2014. On this admission he was admitted with acute on chronic heart failure was found to be in cardiogenic shock which responded only to increased dose of milrinone and placement of intra-aortic balloon pump. The patient was discussed at the [facility] conference and felt to be an appropriate candidate for LVAD implantation as a bridge to transplantation. Prior to surgery the details of LVAD implantation were thoroughly discussed with patient and his wife including the expected postop recovery in the long-term postoperative care and followup.   Postop day 11 VAD implantation  Intraoperative and postoperative course complicated by friable LV myocardium at the apical cannulation site resulting in bleeding from suture tears in the myocardium repaired by multiple extra pledgeted sutures placed before separating from cardio coronary bypass.   He had a stable night and slept > 6hrs after taking Benadryl at bedtime.  The patient's confusion has improved and sitter stoppedNo PI events in 48hrs.  Remains on Milrinone reduced to .065 mcg.  Co-ox 45% today so milrinone increased back to 0.125 mcg MAP >88  this am - getting PRN iv hydralazine. Oral dose of Norvasc has been increased for increased mean arterial pressure  The patient appears much stronger today and walked 450 feet with rehabilitation. His appetite and mood  are also improved  Wt 155lbs, decreased 2 lbs from yesterday- lasix 20 mg daily by mouth and pushing nutrition  LVAD INTERROGATION:  HeartMate II LVAD:  Flow 4.3 liters/min, speed 9000, power 5.3, PI 4 .0    Objective:    Vital Signs:   Temp:  [97.4 F (36.3 C)-98.5 F (36.9 C)]  98.3 F (36.8 C) (01/30 1300) Pulse Rate:  [87-90] 87 (01/30 1230) BP: (88)/(0) 88/0 mmHg (01/30 1122) SpO2:  [92 %] 92 % (01/29 1900) Weight:  [158 lb (71.668 kg)] 158 lb (71.668 kg) (01/30 0520) Last BM Date: 09/27/13 Mean arterial Pressure 90-100, Max 106 this am  Intake/Output:   Intake/Output Summary (Last 24 hours) at 09/29/13 1447 Last data filed at 09/29/13 1300  Gross per 24 hour  Intake    480 ml  Output    700 ml  Net   -220 ml     Physical Exam: General:  Looks tired HEENT: normal Neck: supple. Carotids 2+ bilat; no bruits. No lymphadenopathy or thryomegaly appreciated. Cor:  LVAD hum present. Difficult to hear native heart sounds Lungs: Decreased breath sounds in lower lobes with crackles Abdomen: soft, nontender, nondistended. No hepatosplenomegaly. No bruits or masses. Good bowel sounds. Extremities: no cyanosis, clubbing, rash, edema Neuro: alert & orientedx3 but mentation a little slow, cranial nerves grossly intact. moves all 4 extremities w/o difficulty. Affect flat  Telemetry: sinus 93  Labs: Basic Metabolic Panel:  Recent Labs Lab 09/23/13 0430  09/25/13 0400 09/26/13 0400 09/27/13 0430 09/28/13 0552 09/29/13 0500  NA 141  < > 143 146 147 144 143  K 3.8  < > 4.0 4.0 3.9 3.7 4.3  CL 103  < > 106 109 111 109 107  CO2 24  < > 23 24 23 25 23   GLUCOSE 106*  < > 100* 107* 88 100* 93  BUN 47*  < > 41* 35* 29* 23 21  CREATININE 1.57*  < > 1.40* 1.35 1.35 1.36* 1.33  CALCIUM 8.2*  < > 8.2* 8.2* 8.2* 7.8* 8.0*  MG 2.5  --   --   --   --   --   --   PHOS 3.3  --   --   --   --   --   --   < > = values in this interval not displayed.  Liver Function Tests:  Recent Labs Lab 09/25/13 0400 09/26/13 0400 09/27/13 0430 09/28/13 0552 09/29/13 0500  AST 101* 78* 60* 53* 63*  ALT 193* 147* 112* 83* 81*  ALKPHOS 121* 112 116 111 119*  BILITOT 1.7* 1.4* 1.3* 1.2 1.3*  PROT 6.0 6.0 6.0 5.5* 6.0  ALBUMIN 2.5* 2.5* 2.4* 2.1* 2.2*   No results found  for this basename: LIPASE, AMYLASE,  in the last 168 hours No results found for this basename: AMMONIA,  in the last 168 hours  CBC:  Recent Labs Lab 09/23/13 0430  09/24/13 0400 09/25/13 0400 09/27/13 1030 09/28/13 0552 09/29/13 0500  WBC 9.8  < > 10.5 10.5 10.2 9.9 12.4*  NEUTROABS 8.8*  --  9.4* 9.3*  --   --   --   HGB 8.9*  < > 8.9* 8.7* 9.3* 8.8* 9.3*  HCT 27.1*  < > 27.2* 27.5* 30.2* 27.7* 30.0*  MCV 81.9  < > 83.4 85.7 87.8 86.8 88.0  PLT 158  < > 199 235 311 309 331  < > = values in this interval not displayed.  INR:  Recent Labs Lab 09/25/13 0400 09/26/13 0400 09/27/13 0430 09/28/13 0552 09/29/13 0500  INR 1.58* 2.08* 1.96* 1.99* 1.94*   LDH 472, down from 516 yesterday  Other results:  EKG:   Imaging: Dg Chest 2 View  09/28/2013   CLINICAL DATA:  CHF status post VAD  EXAM: CHEST  2 VIEW  COMPARISON:  DG CHEST 1V PORT dated 09/27/2013  FINDINGS: Improved inspiration when compared to the previous study. A right-sided central venous catheter is appreciated with tip projecting in the region of the superior vena cava. A left chest wall dual chamber AICD is appreciated tips projecting in the region of the right ventricle and right atrium. A ventricular assist device is appreciated unchanged from prior study. Patient is status post median sternotomy. The cardiac silhouette is enlarged. With technique taking into consideration there has been decrease conspicuity of the interstitial findings. A region of perihilar density projects on the right. Linear areas of discoid atelectasis versus scarring project within the periphery of the right hemithorax. There is blunting of the left costophrenic angle. The osseous structures are unremarkable.  IMPRESSION: With technique taken into consideration decreased conspicuity of the interstitial findings likely reflecting decreased pulmonary edema. A mild right perihilar density is appreciated differential considerations are infiltrate  versus atelectasis. Continued surveillance evaluation recommended.   Electronically Signed   By: Margaree Mackintosh M.D.   On: 09/28/2013 13:32    CXR today shows bibasilar atelectasis  Medications:     Scheduled Medications: . amiodarone  200 mg Oral Daily  . amLODipine  10 mg Oral Daily  . aspirin EC  325 mg Oral Daily   Or  . aspirin  324 mg Per Tube Daily   Or  . aspirin  300 mg Rectal Daily  . diphenhydrAMINE  25 mg Oral QHS  . ezetimibe  10 mg Oral Daily  . feeding supplement (ENSURE COMPLETE)  237 mL Oral BID BM  . feeding  supplement (PRO-STAT SUGAR FREE 64)  30 mL Oral BID WC  . ferrous Q000111Q C-folic acid  1 capsule Oral q morning - 10a  . furosemide  20 mg Oral Daily  . levothyroxine  150 mcg Oral Custom   And  . levothyroxine  75 mcg Oral Custom  . OLANZapine  5 mg Oral Q24H  . pantoprazole  40 mg Oral Q1200  . sodium chloride  10-40 mL Intracatheter Q12H  . warfarin  5 mg Oral q1800    Infusions: . sodium chloride 20 mL/hr (09/24/13 0237)  . sodium chloride    . milrinone 0.125 mcg/kg/min (09/29/13 1248)    PRN Medications: sodium chloride, docusate sodium, hydrALAZINE, levalbuterol, menthol-cetylpyridinium, ondansetron (ZOFRAN) IV, potassium chloride, sodium chloride, sodium chloride, traMADol   Assessment:   1 acute on chronic systolic heart failure EF 20% requiring implantable LVAD January 19,015  2 ischemic cardiomyopathy  3 perioperative coagulopathy now improved  4 ventilator dependent respiratory insufficiency due to pulmonary edema, possible ARDS from cardio pulmonary bypass-now resolved and patient is extubated  5 postop delerium, now resolved with better sleep  Plan/Discussion:    We are trying to slowly wean him off milrinone however with the coli ox dropping to 45% we'll increase back to 0.125 and recheck coli ox later this afternoon  INR at 2.0. Will give 5 mg coumadin today  EPW's removed yesterday, incisions healing  nicely   anterior MT removed- CXR clear yesterday  We'll attempt Foley removal again    I reviewed the LVAD parameters from today, and compared the results to the patient's prior recorded data.  No programming changes were made.  The LVAD is functioning within specified parameters.  The patient performs LVAD self-test daily.  LVAD interrogation was negative for any significant power changes, alarms or PI events/speed drops.  LVAD equipment check completed and is in good working order.  Back-up equipment present.   LVAD education done on emergency procedures and precautions and reviewed exit site care.  Length of Stay: [place] 09/29/2013, 2:47 PM

## 2013-09-30 DIAGNOSIS — I5023 Acute on chronic systolic (congestive) heart failure: Secondary | ICD-10-CM

## 2013-09-30 DIAGNOSIS — Z95818 Presence of other cardiac implants and grafts: Secondary | ICD-10-CM

## 2013-09-30 LAB — COMPREHENSIVE METABOLIC PANEL
ALT: 73 U/L — ABNORMAL HIGH (ref 0–53)
AST: 64 U/L — ABNORMAL HIGH (ref 0–37)
Albumin: 2 g/dL — ABNORMAL LOW (ref 3.5–5.2)
Alkaline Phosphatase: 123 U/L — ABNORMAL HIGH (ref 39–117)
BUN: 25 mg/dL — ABNORMAL HIGH (ref 6–23)
CO2: 23 mEq/L (ref 19–32)
Calcium: 7.8 mg/dL — ABNORMAL LOW (ref 8.4–10.5)
Chloride: 109 mEq/L (ref 96–112)
Creatinine, Ser: 1.41 mg/dL — ABNORMAL HIGH (ref 0.50–1.35)
GFR calc Af Amer: 57 mL/min — ABNORMAL LOW (ref >90)
GFR calc non Af Amer: 49 mL/min — ABNORMAL LOW (ref >90)
Glucose, Bld: 107 mg/dL — ABNORMAL HIGH (ref 70–99)
Potassium: 4 mEq/L (ref 3.7–5.3)
Sodium: 144 mEq/L (ref 137–147)
Total Bilirubin: 1 mg/dL (ref 0.3–1.2)
Total Protein: 5.9 g/dL — ABNORMAL LOW (ref 6.0–8.3)

## 2013-09-30 LAB — CBC
HCT: 29.2 % — ABNORMAL LOW (ref 39.0–52.0)
Hemoglobin: 9 g/dL — ABNORMAL LOW (ref 13.0–17.0)
MCH: 27.2 pg (ref 26.0–34.0)
MCHC: 30.8 g/dL (ref 30.0–36.0)
MCV: 88.2 fL (ref 78.0–100.0)
Platelets: 340 10*3/uL (ref 150–400)
RBC: 3.31 MIL/uL — ABNORMAL LOW (ref 4.22–5.81)
RDW: 19.6 % — ABNORMAL HIGH (ref 11.5–15.5)
WBC: 10.4 10*3/uL (ref 4.0–10.5)

## 2013-09-30 LAB — LACTATE DEHYDROGENASE: LDH: 519 U/L — ABNORMAL HIGH (ref 94–250)

## 2013-09-30 LAB — PROTIME-INR
INR: 2.15 — ABNORMAL HIGH (ref 0.00–1.49)
Prothrombin Time: 23.3 seconds — ABNORMAL HIGH (ref 11.6–15.2)

## 2013-09-30 MED ORDER — CEPHALEXIN 500 MG PO CAPS
500.0000 mg | ORAL_CAPSULE | Freq: Four times a day (QID) | ORAL | Status: DC
  Administered 2013-09-30 – 2013-10-03 (×14): 500 mg via ORAL
  Filled 2013-09-30 (×16): qty 1

## 2013-09-30 MED ORDER — POTASSIUM CHLORIDE CRYS ER 20 MEQ PO TBCR
20.0000 meq | EXTENDED_RELEASE_TABLET | Freq: Every day | ORAL | Status: AC
  Administered 2013-09-30 – 2013-10-02 (×3): 20 meq via ORAL
  Filled 2013-09-30 (×4): qty 1

## 2013-09-30 MED ORDER — LOSARTAN POTASSIUM 25 MG PO TABS
25.0000 mg | ORAL_TABLET | Freq: Every day | ORAL | Status: DC
  Administered 2013-09-30: 25 mg via ORAL
  Filled 2013-09-30 (×2): qty 1

## 2013-09-30 MED ORDER — HYDROCODONE-ACETAMINOPHEN 5-325 MG PO TABS
1.0000 | ORAL_TABLET | ORAL | Status: DC | PRN
  Administered 2013-09-30: 1 via ORAL
  Filled 2013-09-30: qty 1

## 2013-09-30 MED ORDER — FUROSEMIDE 40 MG PO TABS
40.0000 mg | ORAL_TABLET | Freq: Every day | ORAL | Status: DC
Start: 2013-09-30 — End: 2013-10-02
  Administered 2013-09-30 – 2013-10-01 (×2): 40 mg via ORAL
  Filled 2013-09-30 (×3): qty 1

## 2013-09-30 NOTE — Progress Notes (Signed)
1220 Cardiac Rehab Attempted to get pt to ambulate. He refused, wanted to talk with visitor. We will follow as time permits. Deon Pilling, RN 09/30/2013 3:44 PM

## 2013-09-30 NOTE — Progress Notes (Signed)
PT Cancellation Note  Patient Details Name: Johnny Oneill MRN: [id number] DOB: [birth year]   Cancelled Treatment:    Reason Eval/Treat Not Completed: Pain limiting ability to participate;Fatigue/lethargy limiting ability to participate.  Patient refused PT today due to pain in Lt side - reports he slept on controller and "bruised" lt side.  Will return tomorrow.   Despina Pole 09/30/2013, 3:58 PM Carita Pian. Sanjuana Kava, [place] Pager [phone]

## 2013-09-30 NOTE — Progress Notes (Signed)
Patient ID: Johnny Oneill, male   DOB: 04-26-1941, 73 y.o.   MRN: [id number] HeartMate 2 Rounding Note  Subjective:    Johnny Oneill is a 73 year old patient with ischemic cardiomyopathy with advanced heart failure on the transplant waiting list at [facility] and supported with home milrinone since April 2014. On this admission he was admitted with acute on chronic heart failure was found to be in cardiogenic shock which responded only to increased dose of milrinone and placement of intra-aortic balloon pump. The patient was discussed at the [facility] conference and felt to be an appropriate candidate for LVAD implantation as a bridge to transplantation. Prior to surgery the details of LVAD implantation were thoroughly discussed with patient and his wife including the expected postop recovery in the long-term postoperative care and followup.    Intraoperative and postoperative course complicated by friable LV myocardium at the apical cannulation site resulting in bleeding from suture tears in the myocardium repaired by multiple extra pledgeted sutures placed before separating from cardio coronary bypass.   Now POD # 12 LVAD implantation  He had a stable night and slept > 6hrs after taking Benadryl at bedtime.  The patient's confusion has improved and sitter stopped Zyprexa stopped No PI events .PI now elevated > 7 so pump speed increased to 9200 RPM and lasix up to 40mg  /day.  Remains on Milrinone 0.125 mcg - followup CO-OX yesterday was improved  MAP elevated and Losartan started  Wt 156lbs, decreased 2 lbs from yesterday-   LVAD INTERROGATION:  HeartMate II LVAD:  Flow 4.3 liters/min, speed 9200, power 5.3, PI 7 .2  Objective:    Vital Signs:   Temp:  [97.5 F (36.4 C)-98.3 F (36.8 C)] 97.5 F (36.4 C) (01/30 2158) Pulse Rate:  [87-91] 91 (01/30 2158) Resp:  [18] 18 (01/30 2158) BP: (88)/(0) 88/0 mmHg (01/30 1122) SpO2:  [97 %] 97 % (01/30 2158) Weight:  [156 lb 4.9 oz (70.9  kg)] 156 lb 4.9 oz (70.9 kg) (01/31 0557) Last BM Date: 09/27/13 Mean arterial Pressure 90-100, Max 106 this am  Intake/Output:   Intake/Output Summary (Last 24 hours) at 09/30/13 1030 Last data filed at 09/30/13 0915  Gross per 24 hour  Intake    250 ml  Output    575 ml  Net   -325 ml     Physical Exam: General:  Looks tired HEENT: normal Neck: supple. Carotids 2+ bilat; no bruits. No lymphadenopathy or thryomegaly appreciated. Cor:  LVAD hum present. Difficult to hear native heart sounds Lungs: Decreased breath sounds in lower lobes with crackles Abdomen: soft, nontender, nondistended. No hepatosplenomegaly. No bruits or masses. Good bowel sounds. Persistent drainage around driveline Extremities: no cyanosis, clubbing, rash, edema Neuro: alert & orientedx3 but mentation a little slow, cranial nerves grossly intact. moves all 4 extremities w/o difficulty. Affect flat  Telemetry: sinus 93  Labs: Basic Metabolic Panel:  Recent Labs Lab 09/26/13 0400 09/27/13 0430 09/28/13 0552 09/29/13 0500 09/30/13 0658  NA 146 147 144 143 144  K 4.0 3.9 3.7 4.3 4.0  CL 109 111 109 107 109  CO2 24 23 25 23 23   GLUCOSE 107* 88 100* 93 107*  BUN 35* 29* 23 21 25*  CREATININE 1.35 1.35 1.36* 1.33 1.41*  CALCIUM 8.2* 8.2* 7.8* 8.0* 7.8*    Liver Function Tests:  Recent Labs Lab 09/26/13 0400 09/27/13 0430 09/28/13 0552 09/29/13 0500 09/30/13 0658  AST 78* 60* 53* 63* 64*  ALT 147* 112* 83* 81*  73*  ALKPHOS 112 116 111 119* 123*  BILITOT 1.4* 1.3* 1.2 1.3* 1.0  PROT 6.0 6.0 5.5* 6.0 5.9*  ALBUMIN 2.5* 2.4* 2.1* 2.2* 2.0*   No results found for this basename: LIPASE, AMYLASE,  in the last 168 hours No results found for this basename: AMMONIA,  in the last 168 hours  CBC:  Recent Labs Lab 09/24/13 0400 09/25/13 0400 09/27/13 1030 09/28/13 0552 09/29/13 0500 09/30/13 0658  WBC 10.5 10.5 10.2 9.9 12.4* 10.4  NEUTROABS 9.4* 9.3*  --   --   --   --   HGB 8.9* 8.7*  9.3* 8.8* 9.3* 9.0*  HCT 27.2* 27.5* 30.2* 27.7* 30.0* 29.2*  MCV 83.4 85.7 87.8 86.8 88.0 88.2  PLT 199 235 311 309 331 340    INR:  Recent Labs Lab 09/26/13 0400 09/27/13 0430 09/28/13 0552 09/29/13 0500 09/30/13 0658  INR 2.08* 1.96* 1.99* 1.94* 2.15*  LDH 495   Other results:  EKG:   Imaging: Dg Chest 2 View  09/28/2013   CLINICAL DATA:  CHF status post VAD  EXAM: CHEST  2 VIEW  COMPARISON:  DG CHEST 1V PORT dated 09/27/2013  FINDINGS: Improved inspiration when compared to the previous study. A right-sided central venous catheter is appreciated with tip projecting in the region of the superior vena cava. A left chest wall dual chamber AICD is appreciated tips projecting in the region of the right ventricle and right atrium. A ventricular assist device is appreciated unchanged from prior study. Patient is status post median sternotomy. The cardiac silhouette is enlarged. With technique taking into consideration there has been decrease conspicuity of the interstitial findings. A region of perihilar density projects on the right. Linear areas of discoid atelectasis versus scarring project within the periphery of the right hemithorax. There is blunting of the left costophrenic angle. The osseous structures are unremarkable.  IMPRESSION: With technique taken into consideration decreased conspicuity of the interstitial findings likely reflecting decreased pulmonary edema. A mild right perihilar density is appreciated differential considerations are infiltrate versus atelectasis. Continued surveillance evaluation recommended.   Electronically Signed   By: Margaree Mackintosh M.D.   On: 09/28/2013 13:32    CXR today shows bibasilar atelectasis  Medications:     Scheduled Medications: . amiodarone  200 mg Oral Daily  . amLODipine  10 mg Oral Daily  . aspirin EC  325 mg Oral Daily   Or  . aspirin  324 mg Per Tube Daily   Or  . aspirin  300 mg Rectal Daily  . diphenhydrAMINE  25 mg Oral  QHS  . ezetimibe  10 mg Oral Daily  . feeding supplement (ENSURE COMPLETE)  237 mL Oral BID BM  . feeding supplement (PRO-STAT SUGAR FREE 64)  30 mL Oral BID WC  . ferrous Q000111Q C-folic acid  1 capsule Oral q morning - 10a  . furosemide  40 mg Oral Daily  . levothyroxine  150 mcg Oral Custom   And  . levothyroxine  75 mcg Oral Custom  . losartan  25 mg Oral Daily  . pantoprazole  40 mg Oral Q1200  . potassium chloride  20 mEq Oral Daily  . sodium chloride  10-40 mL Intracatheter Q12H  . warfarin  5 mg Oral q1800    Infusions: . sodium chloride Stopped (09/30/13 0918)  . sodium chloride    . milrinone 0.125 mcg/kg/min (09/30/13 0916)    PRN Medications: sodium chloride, docusate sodium, hydrALAZINE, levalbuterol, menthol-cetylpyridinium, ondansetron (ZOFRAN) IV, sodium  chloride, sodium chloride, traMADol   Assessment:   1 acute on chronic systolic heart failure EF 20% requiring implantable LVAD January 19,015  2 ischemic cardiomyopathy  3 perioperative coagulopathy now improved  4 ventilator dependent respiratory insufficiency due to pulmonary edema, possible ARDS from cardio pulmonary bypass-now resolved and patient is extubated  5 postop delerium, now resolved with better sleep  Plan/Discussion:   Leave mil at 0.125  INR at 2.0. Will give 5 mg coumadin today  EPW's removed y,chest incisions healing nicely but will start Keflex for diveline exit site cellulitis   All chest tubes  removed- CXR clear yesterday      I reviewed the LVAD parameters from today, and compared the results to the patient's prior recorded data.  No programming changes were made.  The LVAD is functioning within specified parameters.  The patient performs LVAD self-test daily.  LVAD interrogation was negative for any significant power changes, alarms or PI events/speed drops.  LVAD equipment check completed and is in good working order.  Back-up equipment present.   LVAD education done  on emergency procedures and precautions and reviewed exit site care.  Length of Stay: 17  VAN TRIGT III,Tylasia Fletchall 09/30/2013, 10:30 AM

## 2013-09-30 NOTE — Progress Notes (Addendum)
Patient ID: Johnny Oneill, male   DOB: 01-Jul-1941, 73 y.o.   MRN: [id number] HeartMate 2 Rounding Note  Subjective:    Johnny Oneill is a 73 year old patient with ischemic cardiomyopathy with advanced heart failure on the transplant waiting list at [facility] and supported with home milrinone since April 2014. On this admission he was admitted with acute on chronic heart failure was found to be in cardiogenic shock which responded only to increased dose of milrinone and placement of intra-aortic balloon pump. The patient was discussed at the [facility] conference and felt to be an appropriate candidate for LVAD implantation as a bridge to transplantation. Prior to surgery the details of LVAD implantation were thoroughly discussed with patient and his wife including the expected postop recovery in the long-term postoperative care and followup.   Postop day 12  LVAD implantation   Intraoperative and postoperative course complicated by friable LV myocardium at the apical cannulation site resulting in bleeding from suture tears in the myocardium repaired by multiple extra pledgeted sutures placed before separating from cardio coronary bypass.   Doing well from cardiac standpoint. Delirium resolved. Sleeping better.  Weight down 2 lbs this am. Back on oral lasix.  Denies dyspnea. Maintaining SR. Milrinone restarted yesterday.   MAP 90-110 on Norvasc 10 mg   WBC trending down 10.4; CXR yesterday showed bibasilar atx. Using IS   LVAD INTERROGATION:  HeartMate II LVAD:  Flow 3.9 liters/min, speed 9000, power 4.6, PI 8.2. No events  Objective:    Vital Signs:   Temp:  [97.5 F (36.4 C)-98.3 F (36.8 C)] 97.5 F (36.4 C) (01/30 2158) Pulse Rate:  [87-91] 91 (01/30 2158) Resp:  [18] 18 (01/30 2158) BP: (88)/(0) 88/0 mmHg (01/30 1122) SpO2:  [97 %] 97 % (01/30 2158) Weight:  [70.9 kg (156 lb 4.9 oz)] 70.9 kg (156 lb 4.9 oz) (01/31 0557) Last BM Date: 09/27/13  Mean arterial Pressure 90 -  98  Intake/Output:   Intake/Output Summary (Last 24 hours) at 09/30/13 0757 Last data filed at 09/29/13 1700  Gross per 24 hour  Intake    240 ml  Output    875 ml  Net   -635 ml     Physical Exam: General: lying in bed More interactive HEENT: normal Neck: JVP 10 supple. Carotids 2+ bilat; no bruits. No lymphadenopathy or thryomegaly appreciated. Cor:  LVAD hum present. Difficult to hear native heart sounds Lungs: Decreased breath sounds in lower lobes o/w clear Abdomen: soft, nontender, nondistended. No hepatosplenomegaly. No bruits or masses. Good bowel sounds. Extremities: no cyanosis, clubbing, rash, 2+ edema. R forearm swollen Neuro: delirious. Non-focal.   Telemetry: sinus 80-90s  Labs: Basic Metabolic Panel:  Recent Labs Lab 09/25/13 0400 09/26/13 0400 09/27/13 0430 09/28/13 0552 09/29/13 0500  NA 143 146 147 144 143  K 4.0 4.0 3.9 3.7 4.3  CL 106 109 111 109 107  CO2 23 24 23 25 23   GLUCOSE 100* 107* 88 100* 93  BUN 41* 35* 29* 23 21  CREATININE 1.40* 1.35 1.35 1.36* 1.33  CALCIUM 8.2* 8.2* 8.2* 7.8* 8.0*    Liver Function Tests:  Recent Labs Lab 09/25/13 0400 09/26/13 0400 09/27/13 0430 09/28/13 0552 09/29/13 0500  AST 101* 78* 60* 53* 63*  ALT 193* 147* 112* 83* 81*  ALKPHOS 121* 112 116 111 119*  BILITOT 1.7* 1.4* 1.3* 1.2 1.3*  PROT 6.0 6.0 6.0 5.5* 6.0  ALBUMIN 2.5* 2.5* 2.4* 2.1* 2.2*   No results found for this  basename: LIPASE, AMYLASE,  in the last 168 hours No results found for this basename: AMMONIA,  in the last 168 hours  CBC:  Recent Labs Lab 09/24/13 0400 09/25/13 0400 09/27/13 1030 09/28/13 0552 09/29/13 0500 09/30/13 0658  WBC 10.5 10.5 10.2 9.9 12.4* 10.4  NEUTROABS 9.4* 9.3*  --   --   --   --   HGB 8.9* 8.7* 9.3* 8.8* 9.3* 9.0*  HCT 27.2* 27.5* 30.2* 27.7* 30.0* 29.2*  MCV 83.4 85.7 87.8 86.8 88.0 88.2  PLT 199 235 311 309 331 340    INR:  1.94  Recent Labs Lab 09/25/13 0400 09/26/13 0400 09/27/13 0430  09/28/13 0552 09/29/13 0500  INR 1.58* 2.08* 1.96* 1.99* 1.94*     Other results:    Imaging: Dg Chest 2 View  09/28/2013   CLINICAL DATA:  CHF status post VAD  EXAM: CHEST  2 VIEW  COMPARISON:  DG CHEST 1V PORT dated 09/27/2013  FINDINGS: Improved inspiration when compared to the previous study. A right-sided central venous catheter is appreciated with tip projecting in the region of the superior vena cava. A left chest wall dual chamber AICD is appreciated tips projecting in the region of the right ventricle and right atrium. A ventricular assist device is appreciated unchanged from prior study. Patient is status post median sternotomy. The cardiac silhouette is enlarged. With technique taking into consideration there has been decrease conspicuity of the interstitial findings. A region of perihilar density projects on the right. Linear areas of discoid atelectasis versus scarring project within the periphery of the right hemithorax. There is blunting of the left costophrenic angle. The osseous structures are unremarkable.  IMPRESSION: With technique taken into consideration decreased conspicuity of the interstitial findings likely reflecting decreased pulmonary edema. A mild right perihilar density is appreciated differential considerations are infiltrate versus atelectasis. Continued surveillance evaluation recommended.   Electronically Signed   By: Margaree Mackintosh M.D.   On: 09/28/2013 13:32    CXR today shows bibasilar atelectasis  Medications:     Scheduled Medications: . amiodarone  200 mg Oral Daily  . amLODipine  10 mg Oral Daily  . aspirin EC  325 mg Oral Daily   Or  . aspirin  324 mg Per Tube Daily   Or  . aspirin  300 mg Rectal Daily  . diphenhydrAMINE  25 mg Oral QHS  . ezetimibe  10 mg Oral Daily  . feeding supplement (ENSURE COMPLETE)  237 mL Oral BID BM  . feeding supplement (PRO-STAT SUGAR FREE 64)  30 mL Oral BID WC  . ferrous Q000111Q C-folic acid  1  capsule Oral q morning - 10a  . furosemide  20 mg Oral Daily  . levothyroxine  150 mcg Oral Custom   And  . levothyroxine  75 mcg Oral Custom  . OLANZapine  5 mg Oral Q24H  . pantoprazole  40 mg Oral Q1200  . sodium chloride  10-40 mL Intracatheter Q12H  . warfarin  5 mg Oral q1800    Infusions: . sodium chloride 20 mL/hr (09/24/13 0237)  . sodium chloride    . milrinone 0.125 mcg/kg/min (09/30/13 0659)    PRN Medications: sodium chloride, docusate sodium, hydrALAZINE, levalbuterol, menthol-cetylpyridinium, ondansetron (ZOFRAN) IV, potassium chloride, sodium chloride, sodium chloride, traMADol   Assessment:   1. 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 Acute on chronic renal failure, stage III - improved 4. Acute blood loss anemia, expected  -  total 4 PRBCs, 1 FFP  5. Persistent fever: resolved, off antibiotics.  6. Acute delirium   Plan/Discussion:    POD # 12.  Stable from cardiac standpoint.  Continue milrinone for now. Will try to wean again tomorrow. PI is up. Will increase speed to 9200. Does have some fluid on board. Will increase lasix to 40 daily.   RUE swollen distal to PICC will get u/s to exclude DVT.  Delirium resolved. Will stop zyprexa. Use Benadryl to help sleep as needed (uses this at home).   MAPs slightly elevated on Norvasc 10. Will add losartan 25.   Maintaining SR. Continue amio 200 daily. Exit site with a bit of drainage but no obvious infection. Watch closely.   Continue to ambulate and work with PT as tolerated. Continue coumadin per Pharmacy. Possible home Monday.  I reviewed the LVAD parameters from today, and compared the results to the patient's prior recorded data. No programming changes were made. The LVAD is functioning within specified parameters. The patient performs LVAD self-test daily. LVAD interrogation was negative for any significant power changes, alarms or PI events/speed drops. LVAD equipment  check completed and is in good working order. Back-up equipment present. LVAD education done on emergency procedures and precautions and reviewed exit site care.   Length of Stay: 17  Glori Bickers MD 09/30/2013, 7:57 AM

## 2013-10-01 DIAGNOSIS — M7989 Other specified soft tissue disorders: Secondary | ICD-10-CM

## 2013-10-01 DIAGNOSIS — I5022 Chronic systolic (congestive) heart failure: Secondary | ICD-10-CM

## 2013-10-01 LAB — COMPREHENSIVE METABOLIC PANEL
ALT: 83 U/L — ABNORMAL HIGH (ref 0–53)
AST: 78 U/L — ABNORMAL HIGH (ref 0–37)
Albumin: 2.2 g/dL — ABNORMAL LOW (ref 3.5–5.2)
Alkaline Phosphatase: 137 U/L — ABNORMAL HIGH (ref 39–117)
BUN: 24 mg/dL — ABNORMAL HIGH (ref 6–23)
CO2: 24 mEq/L (ref 19–32)
Calcium: 8 mg/dL — ABNORMAL LOW (ref 8.4–10.5)
Chloride: 110 mEq/L (ref 96–112)
Creatinine, Ser: 1.28 mg/dL (ref 0.50–1.35)
GFR calc Af Amer: 64 mL/min — ABNORMAL LOW (ref >90)
GFR calc non Af Amer: 55 mL/min — ABNORMAL LOW (ref >90)
Glucose, Bld: 86 mg/dL (ref 70–99)
Potassium: 4.1 mEq/L (ref 3.7–5.3)
Sodium: 145 mEq/L (ref 137–147)
Total Bilirubin: 1 mg/dL (ref 0.3–1.2)
Total Protein: 6.3 g/dL (ref 6.0–8.3)

## 2013-10-01 LAB — CARBOXYHEMOGLOBIN
Carboxyhemoglobin: 2.1 % — ABNORMAL HIGH (ref 0.5–1.5)
Methemoglobin: 0.9 % (ref 0.0–1.5)
O2 Saturation: 53.2 %
Total hemoglobin: 10.1 g/dL — ABNORMAL LOW (ref 13.5–18.0)

## 2013-10-01 LAB — CBC
HCT: 31.9 % — ABNORMAL LOW (ref 39.0–52.0)
Hemoglobin: 9.8 g/dL — ABNORMAL LOW (ref 13.0–17.0)
MCH: 27.4 pg (ref 26.0–34.0)
MCHC: 30.7 g/dL (ref 30.0–36.0)
MCV: 89.1 fL (ref 78.0–100.0)
Platelets: 357 10*3/uL (ref 150–400)
RBC: 3.58 MIL/uL — ABNORMAL LOW (ref 4.22–5.81)
RDW: 19.7 % — ABNORMAL HIGH (ref 11.5–15.5)
WBC: 10.4 10*3/uL (ref 4.0–10.5)

## 2013-10-01 LAB — PROTIME-INR
INR: 2.31 — ABNORMAL HIGH (ref 0.00–1.49)
Prothrombin Time: 24.6 seconds — ABNORMAL HIGH (ref 11.6–15.2)

## 2013-10-01 LAB — LACTATE DEHYDROGENASE: LDH: 518 U/L — ABNORMAL HIGH (ref 94–250)

## 2013-10-01 MED ORDER — LOSARTAN POTASSIUM 50 MG PO TABS
50.0000 mg | ORAL_TABLET | Freq: Every day | ORAL | Status: DC
  Administered 2013-10-01 – 2013-10-03 (×3): 50 mg via ORAL
  Filled 2013-10-01 (×3): qty 1

## 2013-10-01 MED ORDER — WARFARIN - PHYSICIAN DOSING INPATIENT
Freq: Every day | Status: DC
  Administered 2013-10-02: 18:00:00

## 2013-10-01 MED ORDER — WARFARIN SODIUM 4 MG PO TABS
4.0000 mg | ORAL_TABLET | Freq: Every day | ORAL | Status: DC
  Administered 2013-10-01 – 2013-10-02 (×2): 4 mg via ORAL
  Filled 2013-10-01 (×4): qty 1

## 2013-10-01 NOTE — Progress Notes (Signed)
Patient ID: Johnny Oneill, male   DOB: Jul 12, 1941, 73 y.o.   MRN: [id number] HeartMate 2 Rounding Note  Subjective:    Johnny Oneill is a 73 year old patient with ischemic cardiomyopathy with advanced heart failure on the transplant waiting list at [facility] and supported with home milrinone since April 2014. On this admission he was admitted with acute on chronic heart failure was found to be in cardiogenic shock which responded only to increased dose of milrinone and placement of intra-aortic balloon pump. The patient was discussed at the [facility] conference and felt to be an appropriate candidate for LVAD implantation as a bridge to transplantation. Prior to surgery the details of LVAD implantation were thoroughly discussed with patient and his wife including the expected postop recovery in the long-term postoperative care and followup.    Intraoperative and postoperative course complicated by friable LV myocardium at the apical cannulation site resulting in bleeding from suture tears in the myocardium repaired by multiple extra pledgeted sutures placed before separating from cardio- pulmonary bypass.   Now POD # 13 LVAD implantation  He had a stable night and slept > 6hrs after taking Benadryl at bedtime.  The patient's confusion has improved and sitter stopped Zyprexa stopped No PI events .PI now elevated > 7 so pump speed increased to 9400 RPM this am , lasix up to 40mg  /day.MAP remain elevated so will increase losartan. Will check CO-Ox  Later this am after weaning milrinone to .0625  No pain issues today  Wt 156lbs, stable  LVAD INTERROGATION:  HeartMate II LVAD:  Flow 3.8 liters/min, speed 9400, power 5.3, PI 7 .2  Objective:    Vital Signs:   Temp:  [97.6 F (36.4 C)-97.8 F (36.6 C)] 97.6 F (36.4 C) (01/31 2014) Pulse Rate:  [89-91] 89 (02/01 0700) SpO2:  [98 %] 98 % (01/31 1326) Weight:  [156 lb 12 oz (71.1 kg)] 156 lb 12 oz (71.1 kg) (02/01 0645) Last BM Date:  09/27/13 Mean arterial Pressure 90-100, Max 106 this am  Intake/Output:   Intake/Output Summary (Last 24 hours) at 10/01/13 [id number] Last data filed at 10/01/13 0700  Gross per 24 hour  Intake 2182.15 ml  Output   1000 ml  Net 1182.15 ml     Physical Exam: General:  Much more animated this am HEENT: normal Neck: supple. Carotids 2+ bilat; no bruits. No lymphadenopathy or thryomegaly appreciated. Cor:  LVAD hum present. Difficult to hear native heart sounds Lungs: Decreased breath sounds in lower lobes with crackles Abdomen: soft, nontender, nondistended. No hepatosplenomegaly. No bruits or masses. Good bowel sounds. No drainage around driveline dressing today Extremities: no cyanosis, clubbing, rash, edema Neuro: alert & orientedx3 but mentation a little slow, cranial nerves grossly intact. moves all 4 extremities w/o difficulty. Affect good  Telemetry: sinus 93  Labs: Basic Metabolic Panel:  Recent Labs Lab 09/27/13 0430 09/28/13 0552 09/29/13 0500 09/30/13 0658 10/01/13 0500  NA 147 144 143 144 145  K 3.9 3.7 4.3 4.0 4.1  CL 111 109 107 109 110  CO2 23 25 23 23 24   GLUCOSE 88 100* 93 107* 86  BUN 29* 23 21 25* 24*  CREATININE 1.35 1.36* 1.33 1.41* 1.28  CALCIUM 8.2* 7.8* 8.0* 7.8* 8.0*    Liver Function Tests:  Recent Labs Lab 09/27/13 0430 09/28/13 0552 09/29/13 0500 09/30/13 0658 10/01/13 0500  AST 60* 53* 63* 64* 78*  ALT 112* 83* 81* 73* 83*  ALKPHOS 116 111 119* 123* 137*  BILITOT 1.3*  1.2 1.3* 1.0 1.0  PROT 6.0 5.5* 6.0 5.9* 6.3  ALBUMIN 2.4* 2.1* 2.2* 2.0* 2.2*   No results found for this basename: LIPASE, AMYLASE,  in the last 168 hours No results found for this basename: AMMONIA,  in the last 168 hours  CBC:  Recent Labs Lab 09/25/13 0400 09/27/13 1030 09/28/13 0552 09/29/13 0500 09/30/13 0658 10/01/13 0500  WBC 10.5 10.2 9.9 12.4* 10.4 10.4  NEUTROABS 9.3*  --   --   --   --   --   HGB 8.7* 9.3* 8.8* 9.3* 9.0* 9.8*  HCT 27.5* 30.2*  27.7* 30.0* 29.2* 31.9*  MCV 85.7 87.8 86.8 88.0 88.2 89.1  PLT 235 311 309 331 340 357    INR:  Recent Labs Lab 09/27/13 0430 09/28/13 0552 09/29/13 0500 09/30/13 0658 10/01/13 0500  INR 1.96* 1.99* 1.94* 2.15* 2.31*  LDH 495   Other results:  EKG:   Imaging: No results found.  CXR today shows bibasilar atelectasis  Medications:     Scheduled Medications: . amiodarone  200 mg Oral Daily  . amLODipine  10 mg Oral Daily  . aspirin EC  325 mg Oral Daily   Or  . aspirin  324 mg Per Tube Daily  . cephALEXin  500 mg Oral QID  . diphenhydrAMINE  25 mg Oral QHS  . ezetimibe  10 mg Oral Daily  . feeding supplement (ENSURE COMPLETE)  237 mL Oral BID BM  . feeding supplement (PRO-STAT SUGAR FREE 64)  30 mL Oral BID WC  . ferrous Q000111Q C-folic acid  1 capsule Oral q morning - 10a  . furosemide  40 mg Oral Daily  . levothyroxine  150 mcg Oral Custom   And  . levothyroxine  75 mcg Oral Custom  . losartan  25 mg Oral Daily  . pantoprazole  40 mg Oral Q1200  . potassium chloride  20 mEq Oral Daily  . sodium chloride  10-40 mL Intracatheter Q12H  . warfarin  4 mg Oral q1800    Infusions: . sodium chloride Stopped (09/30/13 0918)  . sodium chloride    . milrinone 0.126 mcg/kg/min (10/01/13 0700)    PRN Medications: sodium chloride, docusate sodium, hydrALAZINE, levalbuterol, menthol-cetylpyridinium, ondansetron (ZOFRAN) IV, sodium chloride, sodium chloride, traMADol   Assessment:   1 acute on chronic systolic heart failure EF 20% requiring implantable LVAD January 19,015  2 ischemic cardiomyopathy  3 perioperative coagulopathy now improved  4 ventilator dependent respiratory insufficiency due to pulmonary edema, possible ARDS from cardio pulmonary bypass-now resolved and patient is extubated  5 postop delerium, now resolved with better sleep  Plan/Discussion:   Wean mil after increasing VAD speed  INR at 2.3. Will give 4 mg coumadin  today  EPW's removed y,chest incisions healing nicely but will start Keflex for diveline exit site cellulitis- less drainage today   All chest tubes  removed- CXR pending in am  Hopefully he will be ready for DC in 24-48 hrs      I reviewed the LVAD parameters from today, and compared the results to the patient's prior recorded data.  No programming changes were made.  The LVAD is functioning within specified parameters.  The patient performs LVAD self-test daily.  LVAD interrogation was negative for any significant power changes, alarms or PI events/speed drops.  LVAD equipment check completed and is in good working order.  Back-up equipment present.   LVAD education done on emergency procedures and precautions and reviewed exit site care.  Length of Stay: 18  VAN TRIGT III,Rickard Kennerly 10/01/2013, 8:12 AM

## 2013-10-01 NOTE — Progress Notes (Signed)
MD notified of blood return on PICC line; warm compress applied; measurement circumference taken of right upper arm 28cm; will continue to monitor and measure q shift.  [name], Marsh Dolly

## 2013-10-01 NOTE — Progress Notes (Signed)
VASCULAR LAB PRELIMINARY  PRELIMINARY  PRELIMINARY  PRELIMINARY  Right upper extremity venous Doppler completed.    Preliminary report:  There is no DVT in the right upper extremity.  There is SVT noted in the right basilic vein at the site of the PICC, extending to just before the confluence with the axillary.  Shahir Karen, RVT 10/01/2013, 10:49 AM

## 2013-10-01 NOTE — Progress Notes (Signed)
Patient ID: Johnny Oneill, male   DOB: 1940-10-25, 73 y.o.   MRN: [id number] HeartMate 2 Rounding Note  Subjective:    Johnny Oneill is a 73 year old patient with ischemic cardiomyopathy with advanced heart failure on the transplant waiting list at [facility] and supported with home milrinone since April 2014. On this admission he was admitted with acute on chronic heart failure was found to be in cardiogenic shock which responded only to increased dose of milrinone and placement of intra-aortic balloon pump. The patient was discussed at the [facility] conference and felt to be an appropriate candidate for LVAD implantation as a bridge to transplantation. Prior to surgery the details of LVAD implantation were thoroughly discussed with patient and his wife including the expected postop recovery in the long-term postoperative care and followup.   Postop day 13  LVAD implantation   Intraoperative and postoperative course complicated by friable LV myocardium at the apical cannulation site resulting in bleeding from suture tears in the myocardium repaired by multiple extra pledgeted sutures placed before separating from cardio coronary bypass.   Doing well from cardiac standpoint. Delirium completely resolved (off zyprexa now). Back on oral lasix. Weight stable. Denies dyspnea. Maintaining SR. Remains on milrinone 0.125  MAP 90-110 on Norvasc 10 mg. Losartan added yesterday. VAD speed increased yesterday to 9200. PI remains high ~8. Started on abx 1/31 for driveline site drainage which is better today.   LVAD INTERROGATION:  HeartMate II LVAD:  Flow 3.9 liters/min, speed 9200, power 4.9 PI 8.0. No events  Objective:    Vital Signs:   Temp:  [97.6 F (36.4 C)-97.8 F (36.6 C)] 97.6 F (36.4 C) (01/31 2014) Pulse Rate:  [89-91] 89 (02/01 0700) SpO2:  [98 %] 98 % (01/31 1326) Weight:  [71.1 kg (156 lb 12 oz)] 71.1 kg (156 lb 12 oz) (02/01 0645) Last BM Date: 09/27/13  Mean arterial Pressure  90 - 98  Intake/Output:   Intake/Output Summary (Last 24 hours) at 10/01/13 0814 Last data filed at 10/01/13 0700  Gross per 24 hour  Intake 2182.15 ml  Output   1000 ml  Net 1182.15 ml     Physical Exam: General: lying in bed. interactive HEENT: normal Neck: JVP 7-8supple. Carotids 2+ bilat; no bruits. No lymphadenopathy or thryomegaly appreciated. Cor:  LVAD hum present. Difficult to hear native heart sounds Lungs: Decreased breath sounds in lower lobes o/w clear Abdomen: soft, nontender, nondistended. No hepatosplenomegaly. No bruits or masses. Good bowel sounds. Extremities: no cyanosis, clubbing, rash, tr edema. R forearm swollen Neuro: delirious. Non-focal.   Telemetry: sinus 80-90s  Labs: Basic Metabolic Panel:  Recent Labs Lab 09/27/13 0430 09/28/13 0552 09/29/13 0500 09/30/13 0658 10/01/13 0500  NA 147 144 143 144 145  K 3.9 3.7 4.3 4.0 4.1  CL 111 109 107 109 110  CO2 23 25 23 23 24   GLUCOSE 88 100* 93 107* 86  BUN 29* 23 21 25* 24*  CREATININE 1.35 1.36* 1.33 1.41* 1.28  CALCIUM 8.2* 7.8* 8.0* 7.8* 8.0*    Liver Function Tests:  Recent Labs Lab 09/27/13 0430 09/28/13 0552 09/29/13 0500 09/30/13 0658 10/01/13 0500  AST 60* 53* 63* 64* 78*  ALT 112* 83* 81* 73* 83*  ALKPHOS 116 111 119* 123* 137*  BILITOT 1.3* 1.2 1.3* 1.0 1.0  PROT 6.0 5.5* 6.0 5.9* 6.3  ALBUMIN 2.4* 2.1* 2.2* 2.0* 2.2*   No results found for this basename: LIPASE, AMYLASE,  in the last 168 hours No results found  for this basename: AMMONIA,  in the last 168 hours  CBC:  Recent Labs Lab 09/25/13 0400 09/27/13 1030 09/28/13 0552 09/29/13 0500 09/30/13 0658 10/01/13 0500  WBC 10.5 10.2 9.9 12.4* 10.4 10.4  NEUTROABS 9.3*  --   --   --   --   --   HGB 8.7* 9.3* 8.8* 9.3* 9.0* 9.8*  HCT 27.5* 30.2* 27.7* 30.0* 29.2* 31.9*  MCV 85.7 87.8 86.8 88.0 88.2 89.1  PLT 235 311 309 331 340 357    INR:  1.94  Recent Labs Lab 09/27/13 0430 09/28/13 0552 09/29/13 0500  09/30/13 0658 10/01/13 0500  INR 1.96* 1.99* 1.94* 2.15* 2.31*     Other results:    Imaging: No results found.  CXR today shows bibasilar atelectasis  Medications:     Scheduled Medications: . amiodarone  200 mg Oral Daily  . amLODipine  10 mg Oral Daily  . aspirin EC  325 mg Oral Daily   Or  . aspirin  324 mg Per Tube Daily  . cephALEXin  500 mg Oral QID  . diphenhydrAMINE  25 mg Oral QHS  . ezetimibe  10 mg Oral Daily  . feeding supplement (ENSURE COMPLETE)  237 mL Oral BID BM  . feeding supplement (PRO-STAT SUGAR FREE 64)  30 mL Oral BID WC  . ferrous Q000111Q C-folic acid  1 capsule Oral q morning - 10a  . furosemide  40 mg Oral Daily  . levothyroxine  150 mcg Oral Custom   And  . levothyroxine  75 mcg Oral Custom  . losartan  25 mg Oral Daily  . pantoprazole  40 mg Oral Q1200  . potassium chloride  20 mEq Oral Daily  . sodium chloride  10-40 mL Intracatheter Q12H  . warfarin  4 mg Oral q1800    Infusions: . sodium chloride Stopped (09/30/13 0918)  . sodium chloride    . milrinone 0.126 mcg/kg/min (10/01/13 0700)    PRN Medications: sodium chloride, docusate sodium, hydrALAZINE, levalbuterol, menthol-cetylpyridinium, ondansetron (ZOFRAN) IV, sodium chloride, sodium chloride, traMADol   Assessment:   1. 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 Acute on chronic renal failure, stage III - improved 4. Acute blood loss anemia, expected  - total 4 PRBCs, 1 FFP  5. Persistent fever: resolved, off antibiotics.  6. Acute delirium   Plan/Discussion:    POD # 14.  Stable from cardiac standpoint.  MAP elevated and PI high. Will increase pump speed to 9400. Cut milrinone down to 0.0625. Check co-ox in 2 hours. Lasix increased yesterday. Will continue. MAPs still elevated on Norvasc 10 and losartan 25. Increase Losartan to 50. 7-day course of abx for exit site.   RUE swollen distal to PICC will get u/s to  exclude DVT.  Maintaining SR. Continue amio 200 daily. INR 2.3  Continue to ambulate and work with PT as tolerated. Continue coumadin per Pharmacy. Possible home Monday or Tuesday depending on milrinone wean.  I reviewed the LVAD parameters from today, and compared the results to the patient's prior recorded data. No programming changes were made. The LVAD is functioning within specified parameters. The patient performs LVAD self-test daily. LVAD interrogation was negative for any significant power changes, alarms or PI events/speed drops. LVAD equipment check completed and is in good working order. Back-up equipment present. LVAD education done on emergency procedures and precautions and reviewed exit site care.  Length of Stay: [place]  MD 10/01/2013, 8:14 AM

## 2013-10-01 NOTE — Progress Notes (Signed)
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 S/P L TKR 2013. Last Cath 2009..  [name]   PT Comments   Patient making improvement with mobility/balance.  Continues to have difficulty with battery change over.  Patient placing batteries in vest before applying vest, gets confused once he unhooks battery or power cord as to what needs to be connected, needed reminder to fasten waist band to secure control device.  Needs step-by-step cuing to complete safely.  Follow Up Recommendations  Home health PT;Supervision/Assistance - 24 hour;Supervision for mobility/OOB     Does the patient have the potential to tolerate intense rehabilitation     Barriers to Discharge        Equipment Recommendations  None recommended by PT    Recommendations for Other Services    Frequency Min 5X/week   Progress towards PT Goals Progress towards PT goals: Progressing toward goals  Plan Current plan remains appropriate    Precautions / Restrictions Precautions Precautions: Sternal;Other (comment) Precaution Comments: LVAD equipment Restrictions Weight Bearing Restrictions: No   Pertinent Vitals/Pain     Mobility  Transfers Overall transfer level: Needs assistance Equipment used: Rolling walker (2 wheeled) Transfers: Sit to/from Stand Sit to Stand: Min guard General transfer comment: Patient recalled hand placement.  Assist for safety when rocking > standing. Ambulation/Gait Ambulation/Gait assistance:  Min guard Ambulation Distance (Feet): 350 Feet Assistive device: Rolling walker (2 wheeled) Gait Pattern/deviations: Step-through pattern;Decreased stride length Gait velocity interpretation: Below normal speed for age/gender General Gait Details: Black bag present during gait.  Continued to be slightly unsteady with gait.      PT Goals (current goals can now be found in the care plan section)    Visit Information  Last PT Received On: 10/01/13 Assistance Needed: +1 History of Present Illness: Johnny Oneill is a 73 year old male with history of CAD s/p stent to LCX 2004 with re-occlusion of LCX with lateral MI 2009 and repeat stenting, total occlusion of RCA, 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 S/P L TKR 2013. Last Cath 2009..  [name]    Subjective Data  Subjective: "These wires are everywhere"  When connecting to batteries.   Cognition  Cognition Arousal/Alertness: Awake/alert Behavior During Therapy: WFL for tasks assessed/performed Overall Cognitive Status: Within Functional Limits for tasks assessed    Balance  Balance Overall balance assessment: Needs assistance Standing balance support: Bilateral upper extremity supported Standing balance-Leahy Scale: Good  End of Session PT - End of Session Equipment Utilized During Treatment: Other (comment) (Black equipment bag) Activity Tolerance: Patient tolerated treatment well Patient left: in chair;with call bell/phone within reach Nurse Communication: Mobility status   GP     Despina Pole 10/01/2013, 1:27 PM Carita Pian. Sanjuana Kava, [place] Pager [phone]

## 2013-10-01 NOTE — Progress Notes (Signed)
Patient ambulated about 400 ft with front wheel walker; patient ambulated well; reminded to slow down while walking; patient back in bed with call bell in reach; will continue to monitor; MAP is 98.  [name], Marsh Dolly

## 2013-10-01 NOTE — Progress Notes (Signed)
MD notified of vascular preliminary results; no DVT; SVT noted; MD orders warm compress; will continue to monitor.  [name], Marsh Dolly

## 2013-10-01 NOTE — Progress Notes (Signed)
Patient's dressing changed by spouse; site is unchanged from yesterday; no foul odor present; serosanguinous drainage noted.   [name], Johnny Oneill

## 2013-10-02 ENCOUNTER — Inpatient Hospital Stay (HOSPITAL_COMMUNITY): Payer: Medicare Other

## 2013-10-02 DIAGNOSIS — Z95818 Presence of other cardiac implants and grafts: Secondary | ICD-10-CM

## 2013-10-02 DIAGNOSIS — I5023 Acute on chronic systolic (congestive) heart failure: Secondary | ICD-10-CM

## 2013-10-02 LAB — LACTATE DEHYDROGENASE: LDH: 534 U/L — ABNORMAL HIGH (ref 94–250)

## 2013-10-02 LAB — COMPREHENSIVE METABOLIC PANEL
ALT: 72 U/L — ABNORMAL HIGH (ref 0–53)
AST: 63 U/L — ABNORMAL HIGH (ref 0–37)
Albumin: 2.2 g/dL — ABNORMAL LOW (ref 3.5–5.2)
Alkaline Phosphatase: 135 U/L — ABNORMAL HIGH (ref 39–117)
BUN: 23 mg/dL (ref 6–23)
CO2: 24 mEq/L (ref 19–32)
Calcium: 8 mg/dL — ABNORMAL LOW (ref 8.4–10.5)
Chloride: 108 mEq/L (ref 96–112)
Creatinine, Ser: 1.13 mg/dL (ref 0.50–1.35)
GFR calc Af Amer: 74 mL/min — ABNORMAL LOW (ref >90)
GFR calc non Af Amer: 64 mL/min — ABNORMAL LOW (ref >90)
Glucose, Bld: 85 mg/dL (ref 70–99)
Potassium: 4 mEq/L (ref 3.7–5.3)
Sodium: 145 mEq/L (ref 137–147)
Total Bilirubin: 0.9 mg/dL (ref 0.3–1.2)
Total Protein: 6.3 g/dL (ref 6.0–8.3)

## 2013-10-02 LAB — PROTIME-INR
INR: 2.11 — ABNORMAL HIGH (ref 0.00–1.49)
Prothrombin Time: 23 seconds — ABNORMAL HIGH (ref 11.6–15.2)

## 2013-10-02 LAB — CBC
HCT: 30.1 % — ABNORMAL LOW (ref 39.0–52.0)
Hemoglobin: 9.4 g/dL — ABNORMAL LOW (ref 13.0–17.0)
MCH: 27.5 pg (ref 26.0–34.0)
MCHC: 31.2 g/dL (ref 30.0–36.0)
MCV: 88 fL (ref 78.0–100.0)
Platelets: 340 10*3/uL (ref 150–400)
RBC: 3.42 MIL/uL — ABNORMAL LOW (ref 4.22–5.81)
RDW: 19.6 % — ABNORMAL HIGH (ref 11.5–15.5)
WBC: 9.4 10*3/uL (ref 4.0–10.5)

## 2013-10-02 LAB — CARBOXYHEMOGLOBIN
Carboxyhemoglobin: 2.1 % — ABNORMAL HIGH (ref 0.5–1.5)
Methemoglobin: 1 % (ref 0.0–1.5)
O2 Saturation: 58.9 %
Total hemoglobin: 9.4 g/dL — ABNORMAL LOW (ref 13.5–18.0)

## 2013-10-02 MED ORDER — HYDRALAZINE HCL 10 MG PO TABS
10.0000 mg | ORAL_TABLET | Freq: Three times a day (TID) | ORAL | Status: DC
  Administered 2013-10-02 – 2013-10-03 (×3): 10 mg via ORAL
  Filled 2013-10-02 (×6): qty 1

## 2013-10-02 MED ORDER — FUROSEMIDE 10 MG/ML IJ SOLN
40.0000 mg | Freq: Once | INTRAMUSCULAR | Status: AC
  Administered 2013-10-02: 40 mg via INTRAVENOUS
  Filled 2013-10-02: qty 4

## 2013-10-02 MED ORDER — SPIRONOLACTONE 12.5 MG HALF TABLET
12.5000 mg | ORAL_TABLET | Freq: Every day | ORAL | Status: DC
  Administered 2013-10-02 – 2013-10-03 (×2): 12.5 mg via ORAL
  Filled 2013-10-02 (×2): qty 1

## 2013-10-02 NOTE — Progress Notes (Signed)
OT Cancellation Note  Patient Details Name: Johnny Oneill MRN: [id number] DOB: 28-Feb-1941   Cancelled Treatment:    Reason Eval/Treat Not Completed: Other (comment) (Pt with visitors) - Attempted x 2.  Pt with visitor, and requested that therapist return at 1330. Returned at 1335, and pt with multiple reasons why he couldn't work with OT (3 visitors in room) and asked therapist to return later.  Explained to pt that I likely would not be able to return today, and pt stated "come back in the morning".    Lucille Passy, OTR/L [id number]  10/02/2013, 1:39 PM

## 2013-10-02 NOTE — Progress Notes (Signed)
Patient ID: Johnny Oneill, male   DOB: Nov 11, 1940, 73 y.o.   MRN: [id number] HeartMate 2 Rounding Note  Subjective:    Johnny Oneill is a 73 year old patient with ischemic cardiomyopathy with advanced heart failure on the transplant waiting list at [facility] and supported with home milrinone since April 2014. On this admission he was admitted with acute on chronic heart failure was found to be in cardiogenic shock which responded only to increased dose of milrinone and placement of intra-aortic balloon pump. The patient was discussed at the [facility] conference and felt to be an appropriate candidate for LVAD implantation as a bridge to transplantation. Prior to surgery the details of LVAD implantation were thoroughly discussed with patient and his wife including the expected postop recovery and  the long-term postoperative care and followup.    Intraoperative and postoperative course complicated by friable LV myocardium at the apical cannulation site resulting in bleeding from suture tears in the myocardium repaired by multiple extra pledgeted sutures placed before separating from cardio- pulmonary bypass.   Now POD # 14 LVAD implantation  He had a stable night but slept poorly at bedtime.  The patient's confusion has improved and sitter stopped Zyprexa stopped No pain issues today  Patient continues with elevated mean arterial pressures and elevated PI despite doubling dose of losartan and increasing diuretic. Co-ox this morning is good so we'll DC milrinone. Continue of speed at 9400 RPM and plan echo speed study in a.m. Aldactone added as well as low-dose Apresoline of  Wt 156lbs, stable  Ultrasound of right arm shows thrombus around the PICC line so we'll remove-right arm remains mildly swollen  LVAD INTERROGATION:  HeartMate II LVAD:  Flow 4.0 liters/min, speed 9400, power 5.3, PI 7 .2  Objective:    Vital Signs:   Temp:  [98 F (36.7 C)] 98 F (36.7 C) (02/01 1956) Pulse Rate:   [86-93] 93 (02/02 0800) Last BM Date: 10/01/13 Mean arterial Pressure 90-100, Max 106 this am  Intake/Output:   Intake/Output Summary (Last 24 hours) at 10/02/13 0939 Last data filed at 10/02/13 0841  Gross per 24 hour  Intake  10.29 ml  Output   1325 ml  Net -1314.71 ml     Physical Exam: General:  Much more animated this am HEENT: normal Neck: supple. Carotids 2+ bilat; no bruits. No lymphadenopathy or thryomegaly appreciated. Cor:  LVAD hum present. Difficult to hear native heart sounds Lungs: Decreased breath sounds in lower lobes with crackles Abdomen: soft, nontender, nondistended. No hepatosplenomegaly. No bruits or masses. Good bowel sounds. Small amount of drainage present in driveline dressing today. Extremities: no cyanosis, clubbing, rash, edema Neuro: alert & orientedx3 but mentation a little slow, cranial nerves grossly intact. moves all 4 extremities w/o difficulty. Affect good  Telemetry: sinus 93  Labs: Basic Metabolic Panel:  Recent Labs Lab 09/28/13 0552 09/29/13 0500 09/30/13 0658 10/01/13 0500 10/02/13 0500  NA 144 143 144 145 145  K 3.7 4.3 4.0 4.1 4.0  CL 109 107 109 110 108  CO2 25 23 23 24 24   GLUCOSE 100* 93 107* 86 85  BUN 23 21 25* 24* 23  CREATININE 1.36* 1.33 1.41* 1.28 1.13  CALCIUM 7.8* 8.0* 7.8* 8.0* 8.0*    Liver Function Tests:  Recent Labs Lab 09/28/13 0552 09/29/13 0500 09/30/13 0658 10/01/13 0500 10/02/13 0500  AST 53* 63* 64* 78* 63*  ALT 83* 81* 73* 83* 72*  ALKPHOS 111 119* 123* 137* 135*  BILITOT 1.2 1.3*  1.0 1.0 0.9  PROT 5.5* 6.0 5.9* 6.3 6.3  ALBUMIN 2.1* 2.2* 2.0* 2.2* 2.2*   No results found for this basename: LIPASE, AMYLASE,  in the last 168 hours No results found for this basename: AMMONIA,  in the last 168 hours  CBC:  Recent Labs Lab 09/28/13 0552 09/29/13 0500 09/30/13 0658 10/01/13 0500 10/02/13 0500  WBC 9.9 12.4* 10.4 10.4 9.4  HGB 8.8* 9.3* 9.0* 9.8* 9.4*  HCT 27.7* 30.0* 29.2* 31.9*  30.1*  MCV 86.8 88.0 88.2 89.1 88.0  PLT 309 331 340 357 340    INR:  Recent Labs Lab 09/28/13 0552 09/29/13 0500 09/30/13 0658 10/01/13 0500 10/02/13 0500  INR 1.99* 1.94* 2.15* 2.31* 2.11*  LDH 495   Other results:  EKG:   Imaging: No results found.  CXR today shows bibasilar atelectasis  Medications:     Scheduled Medications: . amiodarone  200 mg Oral Daily  . amLODipine  10 mg Oral Daily  . aspirin EC  325 mg Oral Daily   Or  . aspirin  324 mg Per Tube Daily  . cephALEXin  500 mg Oral QID  . diphenhydrAMINE  25 mg Oral QHS  . ezetimibe  10 mg Oral Daily  . feeding supplement (ENSURE COMPLETE)  237 mL Oral BID BM  . feeding supplement (PRO-STAT SUGAR FREE 64)  30 mL Oral BID WC  . ferrous Q000111Q C-folic acid  1 capsule Oral q morning - 10a  . furosemide  40 mg Intravenous Once  . hydrALAZINE  10 mg Oral Q8H  . levothyroxine  150 mcg Oral Custom   And  . levothyroxine  75 mcg Oral Custom  . losartan  50 mg Oral Daily  . pantoprazole  40 mg Oral Q1200  . potassium chloride  20 mEq Oral Daily  . sodium chloride  10-40 mL Intracatheter Q12H  . spironolactone  12.5 mg Oral Daily  . warfarin  4 mg Oral q1800  . Warfarin - Physician Dosing Inpatient   Does not apply q1800    Infusions: . sodium chloride Stopped (09/30/13 0918)  . sodium chloride      PRN Medications: sodium chloride, docusate sodium, hydrALAZINE, levalbuterol, menthol-cetylpyridinium, ondansetron (ZOFRAN) IV, sodium chloride, sodium chloride, traMADol   Assessment:   1 acute on chronic systolic heart failure EF 20% requiring implantable LVAD January 19,015  2 ischemic cardiomyopathy  3 perioperative coagulopathy now improved  4 ventilator dependent respiratory insufficiency due to pulmonary edema, possible ARDS from cardio pulmonary bypass-now resolved and patient is extubated  5 postop delerium, now resolved with better sleep  Plan/Discussion:   Stop milrinone,  continue speed at 9400 RPM  INR at 2.0. Will give 4 mg coumadin today  Remove PICC line today, start peripheral IV  EPW's removed y,chest incisions healing nicely but will continue Keflex for diveline exit site cellulitis- less drainage today but still persistent  Predischarge CT chest shows good orientation of LVAD, small right pleural effusion, small left pleural effusion  Plan to prepare patient for discharge in 24-48 hours      I reviewed the LVAD parameters from today, and compared the results to the patient's prior recorded data.  No programming changes were made.  The LVAD is functioning within specified parameters.  The patient performs LVAD self-test daily.  LVAD interrogation was negative for any significant power changes, alarms or PI events/speed drops.  LVAD equipment check completed and is in good working order.  Back-up equipment present.   LVAD  education done on emergency procedures and precautions and reviewed exit site care.  Length of Stay: [place] 10/02/2013, 9:39 AM

## 2013-10-02 NOTE — Progress Notes (Signed)
Physical Therapy Treatment Patient Details Name: CLAYBON KOCIK MRN: [id number] DOB: May 31, 1941 Today's Date: 10/02/2013 Time: 0933-1002 PT Time Calculation (min): 29 min  PT Assessment / Plan / Recommendation  History of Present Illness Mr Doring is a 73 year old male with history of CAD s/p stent to LCX 2004 with re-occlusion of LCX with lateral MI 2009 and repeat stenting, total occlusion of RCA, 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 S/P L TKR 2013. Last Cath 2009..  [name]   PT Comments   Pt continues with difficulty managing his LVAD equipment when switching between batteries and AC power supply. Pt unable to problem-solve problem with verbal and visual cues and ultimately had to be told step-by-step how to hook himself back to [facility] power. Pt also impulsive with mobility (especially on stairs) with incr risk of falls due to impulsivity and high distractibility. Wife not present for session.   Follow Up Recommendations  Home health PT;Supervision/Assistance - 24 hour     Does the patient have the potential to tolerate intense rehabilitation     Barriers to Discharge        Equipment Recommendations  None recommended by PT    Recommendations for Other Services    Frequency Min 5X/week   Progress towards PT Goals Progress towards PT goals: Progressing toward goals  Plan Current plan remains appropriate    Precautions / Restrictions Precautions Precautions: Sternal;Other (comment) Precaution Comments: back-up LVAD equipment when leaving room; pt with difficulty adhering to sternal precautions and required frequent cues Restrictions Weight Bearing Restrictions: No   Pertinent Vitals/Pain Reported dizziness with walking and upon reaching stairwell requested to sit on steps to rest. PI 7.3 SaO2 92% on RA    Mobility  Transfers Overall transfer level: Needs  assistance Equipment used: Rolling walker (2 wheeled) Transfers: Sit to/from Stand Sit to Stand: Min assist General transfer comment: REquired vc for sequencing to protect sternum; assist when standing up from second step (pt felt dizzy in stairwell and sat down to rest) Ambulation/Gait Ambulation/Gait assistance: Min guard Ambulation Distance (Feet): 200 Feet (100, seated rest, stairs, 100) Assistive device: Rolling walker (2 wheeled) Gait Pattern/deviations: Step-through pattern;Drifts right/left Gait velocity: required cues to slow down  General Gait Details: Black bag present during gait.  Continued to be slightly unsteady with gait. Stairs: Yes Stairs assistance: Min guard Stair Management: One rail Right;Alternating pattern;Forwards Number of Stairs: 3 General stair comments: Pt anxious and moving quickly; despite cues to only go up 2 steps due to PICC line in RUE and limited length of line, pt quickly went up 3 and attempting a 4th when he finally heard PT telling him to stop.     Exercises     PT Diagnosis:    PT Problem List:   PT Treatment Interventions:     PT Goals (current goals can now be found in the care plan section)    Visit Information  Last PT Received On: 10/02/13 Assistance Needed: +1 Reason Eval/Treat Not Completed: Patient at procedure or test/unavailable History of Present Illness: Mr Sabol is a 73 year old male with history of CAD s/p stent to LCX 2004 with re-occlusion of LCX with lateral MI 2009 and repeat stenting, total occlusion of RCA, 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 S/P L TKR 2013. Last Cath 2009..  pt s/p  LVAD placement    Subjective Data  Subjective: "I won't have all these wires at home" re: why he is having difficulty making switch from battery to [facility]   Cognition  Cognition Arousal/Alertness: Awake/alert Behavior During Therapy:  Anxious Overall Cognitive Status: Impaired/Different from baseline Current Attention Level: Sustained Memory: Decreased recall of precautions;Decreased short-term memory Following Commands: Follows one step commands with increased time Safety/Judgement: Decreased awareness of safety Problem Solving: Slow processing;Requires verbal cues;Difficulty sequencing General Comments: Continues with difficulty transitioning LVAD equipment    Balance  Balance Overall balance assessment: Needs assistance High level balance activites: Direction changes;Turns;Head turns High Level Balance Comments: highly distracted in busy hallway and began drifting Lt and RT despite use of RW  General Comments General comments (skin integrity, edema, etc.): Pt was already on battery power on arrival (recently returned from CT scan). Pt forgot need to take back-up equipment with him when leaving room to go walk. On return to room, pt with incr difficulty requiring verbal and visual cues to transition from battery to [facility] power. (Pt trying to take battery out of clip prior to detaching from battery. Could not figure out which way to turn connection to loosen it from the battery. Once disconnected from the battery, he put down the line to LVAD and picked up the [facility] line attempting to hook the [facility] line to the battery. He coudl not problem-solve what he was doing wrong with verbal or visual cues and needed to have the correct pieces to connect placed in his hands. This same sequence happened twice (both the white and black cords).  End of Session PT - End of Session Equipment Utilized During Treatment: Other (comment) (black bag) Activity Tolerance: Patient tolerated treatment well Patient left: in chair;with call bell/phone within reach Nurse Communication: Other (comment) (confusion with switching back to [facility] power)   GP     Kong Packett 10/02/2013, 10:23 AM Pager [phone]

## 2013-10-02 NOTE — Progress Notes (Signed)
Practiced disconnecting from A/C wall unit and batteries; tv was turned off and patient was alone; less distracted this time; minimal verbal cues.  [name], Marsh Dolly

## 2013-10-02 NOTE — Progress Notes (Signed)
PT Cancellation Note  Patient Details Name: Johnny Oneill MRN: [id number] DOB: [birth year]   Cancelled Treatment:    Reason Eval/Treat Not Completed: Patient at procedure or test/unavailable. Pt just returning from CT. Wants to eat breakfast. Will return.   Lowry Bala 10/02/2013, 9:15 AM Pager [phone]

## 2013-10-02 NOTE — Progress Notes (Signed)
Dressing changed; wound cultured and sent to lab; CT removed per order and protocol; patient is resting in chair with call bell in reach; will continue to monitor.  [name], Marsh Dolly

## 2013-10-02 NOTE — Progress Notes (Signed)
Patient ID: SAAHAS MCPHIE, male   DOB: Sep 13, 1940, 73 y.o.   MRN: [id number] HeartMate 2 Rounding Note  Subjective:    [name] is a 73 year old patient with ischemic cardiomyopathy with advanced heart failure on the transplant waiting list at [facility] and supported with home milrinone since April 2014. On this admission he was admitted with acute on chronic heart failure was found to be in cardiogenic shock which responded only to increased dose of milrinone and placement of intra-aortic balloon pump. The patient was discussed at the [facility] conference and felt to be an appropriate candidate for LVAD implantation as a bridge to transplantation. Prior to surgery the details of LVAD implantation were thoroughly discussed with patient and his wife including the expected postop recovery in the long-term postoperative care and followup.   Postop day 14  LVAD implantation   Intraoperative and postoperative course complicated by friable LV myocardium at the apical cannulation site resulting in bleeding from suture tears in the myocardium repaired by multiple extra pledgeted sutures placed before separating from cardio coronary bypass.   Stable overnight. Delirium resolved off zyprexa. Still having difficulty with LVAD equipment. Ambulating in the halls with no SOB. On oral lasix, weight stable. Started on Keflex for driveline drainage. R forarm swollen and UE dopper performed showing SVT noted in the right basilic vein at the site of the PICC, extending to just before the confluence with the axillary.   LDH 534 INR 2.11 Co-ox 59%  LVAD INTERROGATION:  HeartMate II LVAD:  Flow 4.2 liters/min, speed 9400, power 5.3  PI 8.0. No events  Objective:    Vital Signs:   Temp:  [98 F (36.7 C)] 98 F (36.7 C) (02/01 1956) Pulse Rate:  [86-92] 86 (02/01 1700) Last BM Date: 09/30/13  Mean arterial Pressure 90-100s  Intake/Output:   Intake/Output Summary (Last 24 hours) at 10/02/13  0800 Last data filed at 10/01/13 1748  Gross per 24 hour  Intake 135.23 ml  Output   1375 ml  Net -1239.77 ml     Physical Exam: General: Sitting in chair. interactive HEENT: normal Neck: JVP 8-9 supple. Carotids 2+ bilat; no bruits. No lymphadenopathy or thryomegaly appreciated. Cor:  LVAD hum present. Difficult to hear native heart sounds Lungs: Decreased breath sounds in lower lobes o/w clear Abdomen: soft, nontender, nondistended. No hepatosplenomegaly. No bruits or masses. Good bowel sounds. Extremities: no cyanosis, clubbing, rash, 1+ bilateral edema. R forearm swollen Neuro: delirious. Non-focal.   Telemetry: sinus 80-90s  Labs: Basic Metabolic Panel:  Recent Labs Lab 09/28/13 0552 09/29/13 0500 09/30/13 0658 10/01/13 0500 10/02/13 0500  NA 144 143 144 145 145  K 3.7 4.3 4.0 4.1 4.0  CL 109 107 109 110 108  CO2 25 23 23 24 24   GLUCOSE 100* 93 107* 86 85  BUN 23 21 25* 24* 23  CREATININE 1.36* 1.33 1.41* 1.28 1.13  CALCIUM 7.8* 8.0* 7.8* 8.0* 8.0*    Liver Function Tests:  Recent Labs Lab 09/28/13 0552 09/29/13 0500 09/30/13 0658 10/01/13 0500 10/02/13 0500  AST 53* 63* 64* 78* 63*  ALT 83* 81* 73* 83* 72*  ALKPHOS 111 119* 123* 137* 135*  BILITOT 1.2 1.3* 1.0 1.0 0.9  PROT 5.5* 6.0 5.9* 6.3 6.3  ALBUMIN 2.1* 2.2* 2.0* 2.2* 2.2*   No results found for this basename: LIPASE, AMYLASE,  in the last 168 hours No results found for this basename: AMMONIA,  in the last 168 hours  CBC:  Recent Labs Lab  09/28/13 0552 09/29/13 0500 09/30/13 0658 10/01/13 0500 10/02/13 0500  WBC 9.9 12.4* 10.4 10.4 9.4  HGB 8.8* 9.3* 9.0* 9.8* 9.4*  HCT 27.7* 30.0* 29.2* 31.9* 30.1*  MCV 86.8 88.0 88.2 89.1 88.0  PLT 309 331 340 357 340    INR:  2.11  Recent Labs Lab 09/28/13 0552 09/29/13 0500 09/30/13 0658 10/01/13 0500 10/02/13 0500  INR 1.99* 1.94* 2.15* 2.31* 2.11*     Other results:    Imaging: No results found.  CXR today shows bibasilar  atelectasis  Medications:     Scheduled Medications: . amiodarone  200 mg Oral Daily  . amLODipine  10 mg Oral Daily  . aspirin EC  325 mg Oral Daily   Or  . aspirin  324 mg Per Tube Daily  . cephALEXin  500 mg Oral QID  . diphenhydrAMINE  25 mg Oral QHS  . ezetimibe  10 mg Oral Daily  . feeding supplement (ENSURE COMPLETE)  237 mL Oral BID BM  . feeding supplement (PRO-STAT SUGAR FREE 64)  30 mL Oral BID WC  . ferrous Q000111Q C-folic acid  1 capsule Oral q morning - 10a  . furosemide  40 mg Oral Daily  . levothyroxine  150 mcg Oral Custom   And  . levothyroxine  75 mcg Oral Custom  . losartan  50 mg Oral Daily  . pantoprazole  40 mg Oral Q1200  . potassium chloride  20 mEq Oral Daily  . sodium chloride  10-40 mL Intracatheter Q12H  . warfarin  4 mg Oral q1800  . Warfarin - Physician Dosing Inpatient   Does not apply q1800    Infusions: . sodium chloride Stopped (09/30/13 0918)  . sodium chloride    . milrinone 0.06 mcg/kg/min (10/01/13 1748)    PRN Medications: sodium chloride, docusate sodium, hydrALAZINE, levalbuterol, menthol-cetylpyridinium, ondansetron (ZOFRAN) IV, sodium chloride, sodium chloride, traMADol   Assessment:   1. 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 Acute on chronic renal failure, stage III - improved 4. Acute blood loss anemia, expected  - total 4 PRBCs, 1 FFP  5. Persistent fever: resolved, off antibiotics.  6. Acute delirium   Plan/Discussion:    POD # 14. Doing well. Continues to have issues with handling equipment, will have PT continue to work with pt and VAD Coordinator.  Urine output dark and LE edema will give one dose of 40 mg IV lasix today. MAPs still elevated will start Spiro 12.5 mg daily and monitor K+ and Cr. Co-ox stable will turn milrinone off today. Will do RAMP ECHO at bedside today to assess pump speed. Will also get chest CT to assess pump placement before discharge  later this week.   RUE is improving.   Maintaining SR. Continue amio 200 daily. INR 2.3  I reviewed the LVAD parameters from today, and compared the results to the patient's prior recorded data. No programming changes were made. The LVAD is functioning within specified parameters. The patient performs LVAD self-test daily. LVAD interrogation was negative for any significant power changes, alarms or PI events/speed drops. LVAD equipment check completed and is in good working order. Back-up equipment present. LVAD education done on emergency procedures and precautions and reviewed exit site care.  Length of Stay: [place] B NP-C 8:27 AM Advanced Heart Failure Team Pager [phone] (M-F; 7a - 4p)  Please contact [facility] for night-coverage after hours (4p -7a ) and weekends  on [email or web address] VAD issues page [phone]  Patient seen and examined with Junie Bame, NP. We discussed all aspects of the encounter. I agree with the assessment and plan as stated above.   Looks very good this am. Co-ox up after speed increased. Will stop milrinone. MAPs up - add spiro and give 1 dose IV lasix. Continue abx for driveline site x 1 week. Will get CT today.  Ramp echo tomorrow off milrinone. Possibly home Tues or Wed.   VAD parameters stable.   Daniel Bensimhon,MD 8:42 AM

## 2013-10-02 NOTE — Progress Notes (Signed)
CSW met with patient at bedside. Patient was sitting up in bed dressed in hospital gown. Patient was in great spirits and had a friend visiting. He reports improved strength and hoping for discharge home within the next few days. Patient reports his wife is coping well and will be in to visit this afternoon. Patient appears to be coping well with recovery and denies any concerns at this time. CSW will continue to follow for support throughout recovery. Raquel Sarna, [place]

## 2013-10-02 NOTE — Progress Notes (Signed)
Patient's home VAD equipment delivered and set up in room.   Discharge equipment includes:    ____ Two system controllers with preset:    Fixed speed of 9400 RPM     Low speed limit 8800 RPM  ____ One power module (PM) with 20' patient cable  ____ One universal Charity fundraiser (U[employer])  ____ Eight fully charged batteries  ____ Four battery clips  ____ One travel case  ____ One holster vest ____ 30 daily dressing kits ____ [employer] car adapter  ____ Programmer, multimedia  Observed patient switch power source from power module to batteries. Pt slowly completed task correctly; encouraged patient and nurse to allow him to change power source more frequently and stress importance of focusing on task at hand. Repetition will increase patient efficiency and confidence.

## 2013-10-02 NOTE — Progress Notes (Signed)
CARDIAC REHAB PHASE I   PRE:  Rate/Rhythm: 91 SR    BP: sitting 88    SaO2: 95 RA  MODE:  Ambulation: 560 ft   POST:  Rate/Rhythm: 96 SR    BP: sitting 90     SaO2: 90-91 RA  Before walking pt donned underwear, pants, socks, shoes and attached system controller to belt. Needed verbal cues to think through process. Wife present. Used rollator which he liked and would like one for home. Quick pace, reminders to control speed. To BR in hall due to urgency. Practiced urinating in toilet through zipper in pants. Increased distance however walked too fast and fatigued quickly. Practiced sitting on rollator and rested. Return to EOB and switched to power cord. Needed verbal reminders to think before he acted to ensure proper sequence. Gets distracted very easily. Wife present entire time. Vss. To bed, left pants on (system controller attached to belt loops).   [id number]  Josephina Shih [place] CES, ACSM 10/02/2013 3:42 PM

## 2013-10-03 DIAGNOSIS — I319 Disease of pericardium, unspecified: Secondary | ICD-10-CM

## 2013-10-03 DIAGNOSIS — Z95818 Presence of other cardiac implants and grafts: Secondary | ICD-10-CM

## 2013-10-03 DIAGNOSIS — I5023 Acute on chronic systolic (congestive) heart failure: Secondary | ICD-10-CM

## 2013-10-03 LAB — CBC
HCT: 29.2 % — ABNORMAL LOW (ref 39.0–52.0)
Hemoglobin: 8.9 g/dL — ABNORMAL LOW (ref 13.0–17.0)
MCH: 27 pg (ref 26.0–34.0)
MCHC: 30.5 g/dL (ref 30.0–36.0)
MCV: 88.5 fL (ref 78.0–100.0)
Platelets: 341 10*3/uL (ref 150–400)
RBC: 3.3 MIL/uL — ABNORMAL LOW (ref 4.22–5.81)
RDW: 20 % — ABNORMAL HIGH (ref 11.5–15.5)
WBC: 9.4 10*3/uL (ref 4.0–10.5)

## 2013-10-03 LAB — COMPREHENSIVE METABOLIC PANEL
ALT: 64 U/L — ABNORMAL HIGH (ref 0–53)
AST: 56 U/L — ABNORMAL HIGH (ref 0–37)
Albumin: 2.2 g/dL — ABNORMAL LOW (ref 3.5–5.2)
Alkaline Phosphatase: 138 U/L — ABNORMAL HIGH (ref 39–117)
BUN: 25 mg/dL — ABNORMAL HIGH (ref 6–23)
CO2: 23 mEq/L (ref 19–32)
Calcium: 8 mg/dL — ABNORMAL LOW (ref 8.4–10.5)
Chloride: 107 mEq/L (ref 96–112)
Creatinine, Ser: 1.2 mg/dL (ref 0.50–1.35)
GFR calc Af Amer: 69 mL/min — ABNORMAL LOW (ref >90)
GFR calc non Af Amer: 60 mL/min — ABNORMAL LOW (ref >90)
Glucose, Bld: 102 mg/dL — ABNORMAL HIGH (ref 70–99)
Potassium: 3.7 mEq/L (ref 3.7–5.3)
Sodium: 143 mEq/L (ref 137–147)
Total Bilirubin: 0.8 mg/dL (ref 0.3–1.2)
Total Protein: 6 g/dL (ref 6.0–8.3)

## 2013-10-03 LAB — PROTIME-INR
INR: 2.1 — ABNORMAL HIGH (ref 0.00–1.49)
Prothrombin Time: 22.9 seconds — ABNORMAL HIGH (ref 11.6–15.2)

## 2013-10-03 LAB — LACTATE DEHYDROGENASE: LDH: 531 U/L — ABNORMAL HIGH (ref 94–250)

## 2013-10-03 MED ORDER — DSS 100 MG PO CAPS: 200.0000 mg | ORAL_CAPSULE | Freq: Every day | ORAL | Status: DC | PRN

## 2013-10-03 MED ORDER — TRAMADOL HCL 50 MG PO TABS: 50.0000 mg | ORAL_TABLET | Freq: Four times a day (QID) | ORAL | Status: DC | PRN

## 2013-10-03 MED ORDER — FE FUMARATE-B12-VIT C-FA-IFC PO CAPS: 1.0000 | ORAL_CAPSULE | Freq: Every morning | ORAL | Status: DC

## 2013-10-03 MED ORDER — WARFARIN SODIUM 5 MG PO TABS
5.0000 mg | ORAL_TABLET | Freq: Once | ORAL | Status: AC
  Administered 2013-10-03: 5 mg via ORAL
  Filled 2013-10-03: qty 1

## 2013-10-03 MED ORDER — AMLODIPINE BESYLATE 10 MG PO TABS: 10.0000 mg | ORAL_TABLET | Freq: Every day | ORAL | Status: DC

## 2013-10-03 MED ORDER — PANTOPRAZOLE SODIUM 40 MG PO TBEC: 40.0000 mg | DELAYED_RELEASE_TABLET | Freq: Every day | ORAL | Status: DC

## 2013-10-03 MED ORDER — FUROSEMIDE 20 MG PO TABS: 40.0000 mg | ORAL_TABLET | ORAL | Status: DC

## 2013-10-03 MED ORDER — LOSARTAN POTASSIUM 50 MG PO TABS: 50.0000 mg | ORAL_TABLET | Freq: Every day | ORAL | Status: DC

## 2013-10-03 MED ORDER — WARFARIN SODIUM 5 MG PO TABS: 5.0000 mg | ORAL_TABLET | Freq: Every day | ORAL | Status: DC

## 2013-10-03 MED ORDER — CEPHALEXIN 500 MG PO CAPS: 500.0000 mg | ORAL_CAPSULE | Freq: Four times a day (QID) | ORAL | Status: DC

## 2013-10-03 MED ORDER — SPIRONOLACTONE 25 MG PO TABS: 25.0000 mg | ORAL_TABLET | Freq: Every day | ORAL | Status: DC

## 2013-10-03 NOTE — Progress Notes (Signed)
Speed    Flow     PI   Power   LVIDD      AI   Aortic openings     MR      TR  Septum        RV  9400 3.5 6.8 5.0 6.1 trivial 0/0 trivial trivial midline Mildly depressed  9600 3.8 6.6 5.4 5.9 trivial 0/0 mild  Pulling to left moderate                            Ramp ECHO performed at bedside per Dr. Aundra Dubin.  At completion of ramp study, patients primary and back up controller programmed:  Fixed speed    9400  RPM   Low speed limit   8800  RPM

## 2013-10-03 NOTE — Progress Notes (Signed)
Driveline dressing changed using sterile technique by wife.  Small amount of dry, brown discharge on dressing. Skin intact around site. Silver ribbon applied and dressing applied.  Will continue to monitor pt closely.

## 2013-10-03 NOTE — Progress Notes (Signed)
OT Cancellation Note  Patient Details Name: Johnny Oneill MRN: [id number] DOB: 1940-09-12   Cancelled Treatment:    Reason Eval/Treat Not Completed: Fatigue/lethargy limiting ability to participate - Pt refusing to work with OT due to fatigue.  Attempted x 10 mins.  Pt would agree then refuse.  Pt asking multiple questions about rollator, answered his questions and he would repeat them 1-2 mins later. Also asking what he is to do with the power supply - he insists he has not been instructed in this.  [place], OTR/L [id number]  10/03/2013, 10:07 AM

## 2013-10-03 NOTE — Discharge Instructions (Signed)
[facility] Discharge Instructions for HeartMate II LVAD Patients and Their Caregivers  While living at home with your HeartMate II LVAD, it is important that you carefully follow these instructions form your medical provider:  1. Change the percutaneous lead exit site dressing as directed, using the sterile technique shown to you at the hospital.  2.  Inspect the percutaneous lead exit site daily (during dressing changes) to look for signs of infection, such as redness, swelling, discharge, odor, or warmth.  3.  Inspect the percutaneous lead daily for signs of damage such as cuts, tears, or exposed wires and notify your hospital contact person.  4.  Call your hospital contact person if your temperature is above 100 degrees.  5.  Check your weight each morning and record it on your flow sheet.  Call your hospital contact person if you gain more than 3 pounds over a one day period (or 5 or more pounds in one week).  6.  Call your doctor if you notice any swelling in your ankles or changes in your waistline.  This may be a sign of water retention.  7.  Record your LVAD speed, power, PI, and flow once a day.  System Maintenance  1.  Do NOT twist or kink your percutaneous lead.  2.  Inspect the percutaneous lead connection to the system controller and make sure the perc lock is in the locked position at least once a day.  3.  Immediately call your hospital contact person if you notice any changes in how the LVAS feels, operates, or sounds.  Activities  1.  Do NOT take tub baths or go swimming while implanted with the pump.  2.  Showers are allowed only after approved by your medical provider.  Do NOT take showers without using the HeartMate [employer] shower bag to protect your equipment from the water.  3.  Avoid any activity with the potential for immersion into water; the components are not waterproof and this will cause the pump to stop.  4.  Do NOT drive or operate heavy  machinery unless your medical provider gives you permission.  5.  Do NOT play contact sports while implanted with the LVAD.  6.  Do NOT put any ointments or creams on the driveline or the area of skin around the driveline.  7.  Do NOT become pregnant.  Pain  By the time you are discharged, you should not be experiencing significant pain.  Appropriate pain medication may be prescribed and should be taken as ordered.  If you experience any change or increase in pain, immediately call your medical provider or hospital contact person.  Diet  1.  Follow the heart healthy diet suggested by your medical team.  2.  If you have diabetes, be sure to discuss nutritional supplements, counting carbohydrates, and meal planning with your medical team.  3.  Since the LVAD is implanted near your stomach, you may feel full faster.  Therefore, you may need to eat more frequent, smaller portions throughout the day, rather than a few large meals.  4.  Some foods will increase or decrease the effect of your anticoagulation medications.  Discuss what foods to avoid or limit with your dietician.  Smoking and Tobacco Products  You must STOP SMOKING if you have not already done so.  All tobacco products (smoked or otherwise) cause arteries to constrict (tighten), which reduces the amount of blood that reaches your tissues and lungs.  This makes it harder for  your pump to work effectively.  Using tobacco products also reduces your ability to fight infection.  Note:  Avoid secondhand smoke, which also affects your blood vessels and how your pump works.  Alcohol  1.  Alcohol can interfere or interact with certain medications.  Ask your medical provider to discuss the affect that alcohol may have on your medication(s).  2.  Alcohol is a diuretic, which means it can cause you to become dehydrated.  Since your LVAD depends on sufficient blood volume to work best, it is important that your (non-alcoholic) fluid  intake is maintained.  If you indulge in alcoholic beverages, be sure to drink plenty of water as well.  3.  Alcohol consumption may impair your ability to understand and react to system alarms.  Weather  1.  Avoid activity in very hot or very cold temperatures.  2.  If you go outdoors during very hot or humid weather, be sure to drink plenty of water or nonalcoholic beverages.  3.  When putting on heavy clothing, coats or jackets, take care to avoid kinking, bending, twisting, or tugging on your percutaneous lead.  Miscellaneous  1.  Avoid strong static discharge that might come from touching television or computer screens, vacuuming or from scuffing your feet on carpets.  A very strong static discharge had the potential to cause the LVAD to stop.  Consider spraying the carpets with Static Guard or other similar products.  2.  After your home passes the pre-discharge safety check, you and your caregiver are responsible for making sure that your surroundings continue to be safe.  If you have any questions or concerns about your home environment, call your hospital contact person.  If you are uncomfortable testing your home's electrical system, hire an electrician to do it for you.  3.  Know and understand the warnings and cautions associated with having an LVAD and for safe LVAD operation (see your Patient Handbook for a complete list of warnings and cautions).

## 2013-10-03 NOTE — Progress Notes (Addendum)
Patient ID: Johnny Oneill, male   DOB: March 21, 1941, 73 y.o.   MRN: [id number] HeartMate 2 Rounding Note  Subjective:    Johnny Oneill is a 73 year old patient with ischemic cardiomyopathy with advanced heart failure on the transplant waiting list at [facility] and supported with home milrinone since April 2014. On this admission he was admitted with acute on chronic heart failure was found to be in cardiogenic shock which responded only to increased dose of milrinone and placement of intra-aortic balloon pump. The patient was discussed at the [facility] conference and felt to be an appropriate candidate for LVAD implantation as a bridge to transplantation. Prior to surgery the details of LVAD implantation were thoroughly discussed with patient and his wife including the expected postop recovery in the long-term postoperative care and followup.   Postop day 15  LVAD implantation   Intraoperative and postoperative course complicated by friable LV myocardium at the apical cannulation site resulting in bleeding from suture tears in the myocardium repaired by multiple extra pledgeted sutures placed before separating from cardio coronary bypass.   Given IV lasix yesterday. Diuresed well. -1.3L Milrinone stopped. Feels good. Ambulating without problem. PICC line pulled due to superficial thrombus.   MAPs 78-100  LVAD INTERROGATION:  HeartMate II LVAD:  Flow 4.0 liters/min, speed 9400, power 5.0  PI 6.4. No events  Objective:    Vital Signs:   Temp:  [97 F (36.1 C)-97.9 F (36.6 C)] 97.9 F (36.6 C) (02/02 2359) Pulse Rate:  [73-93] 82 (02/02 2359) Resp:  [18-20] 20 (02/02 2043) SpO2:  [92 %-98 %] 97 % (02/02 2359) Weight:  [69.7 kg (153 lb 10.6 oz)] 69.7 kg (153 lb 10.6 oz) (02/02 1013) Last BM Date: 10/02/13  Mean arterial Pressure 90-100s  Intake/Output:   Intake/Output Summary (Last 24 hours) at 10/03/13 0509 Last data filed at 10/02/13 1256  Gross per 24 hour  Intake      0 ml   Output    600 ml  Net   -600 ml     Physical Exam: General: Sitting in chair. interactive HEENT: normal Neck: JVP 7 supple. Carotids 2+ bilat; no bruits. No lymphadenopathy or thryomegaly appreciated. Cor:  LVAD hum present. Difficult to hear native heart sounds Lungs: clear Abdomen: soft, nontender, nondistended. No hepatosplenomegaly. No bruits or masses. Good bowel sounds. Extremities: no cyanosis, clubbing, rash, no edema.  Neuro: delirious. Non-focal.   Telemetry: sinus 80-90s  Labs: Basic Metabolic Panel:  Recent Labs Lab 09/28/13 0552 09/29/13 0500 09/30/13 0658 10/01/13 0500 10/02/13 0500  NA 144 143 144 145 145  K 3.7 4.3 4.0 4.1 4.0  CL 109 107 109 110 108  CO2 25 23 23 24 24   GLUCOSE 100* 93 107* 86 85  BUN 23 21 25* 24* 23  CREATININE 1.36* 1.33 1.41* 1.28 1.13  CALCIUM 7.8* 8.0* 7.8* 8.0* 8.0*    Liver Function Tests:  Recent Labs Lab 09/28/13 0552 09/29/13 0500 09/30/13 0658 10/01/13 0500 10/02/13 0500  AST 53* 63* 64* 78* 63*  ALT 83* 81* 73* 83* 72*  ALKPHOS 111 119* 123* 137* 135*  BILITOT 1.2 1.3* 1.0 1.0 0.9  PROT 5.5* 6.0 5.9* 6.3 6.3  ALBUMIN 2.1* 2.2* 2.0* 2.2* 2.2*   No results found for this basename: LIPASE, AMYLASE,  in the last 168 hours No results found for this basename: AMMONIA,  in the last 168 hours  CBC:  Recent Labs Lab 09/28/13 0552 09/29/13 0500 09/30/13 0658 10/01/13 0500 10/02/13 0500  WBC 9.9 12.4* 10.4 10.4 9.4  HGB 8.8* 9.3* 9.0* 9.8* 9.4*  HCT 27.7* 30.0* 29.2* 31.9* 30.1*  MCV 86.8 88.0 88.2 89.1 88.0  PLT 309 331 340 357 340    INR:  2.11  Recent Labs Lab 09/28/13 0552 09/29/13 0500 09/30/13 0658 10/01/13 0500 10/02/13 0500  INR 1.99* 1.94* 2.15* 2.31* 2.11*     Other results:    Imaging: Ct Chest Wo Contrast  10/02/2013   CLINICAL DATA:  Recent LVAD placement, Elevated BP since surgery Pt denies any chest pain  EXAM: CT CHEST WITHOUT CONTRAST  TECHNIQUE: Multidetector CT imaging of  the chest was performed following the standard protocol without IV contrast.  COMPARISON:  DG CHEST 2 VIEW dated 09/28/2013  FINDINGS: Noncontrast evaluation of the thoracic inlet is unremarkable.  Patient is status post median sternotomy.  Artifact consistent with metallic beam hardening is appreciated within the left chest wall, secondary to a dual-chambered AICD . Lead tips projecting in the region of the right atrium and right ventricle.  A left-sided PICC line is appreciated tip projecting in the region of the distal superior vena cava.  A left ventricular assist device is appreciated. There is associated metallic beam hardening artifact limiting visualization. The device appears to be appropriately positioned.  Multichamber cardiac enlargement is identified. Atherosclerotic calcification appreciated within coronary vessels. A small pericardial effusion is appreciated. No definite mediastinal masses nor gross adenopathy is appreciated. Subcentimeter lymph nodes are identified within the prevascular space. A prominent right paratracheal lymph is identified image 22 series 2. Is likely a reactive lymph node considering the patient's history. Otherwisem there is no evidence of mediastinal or hilar adenopathy.  Small bilateral pleural effusions are identified. A loculated component is appreciated within the lateral upper chest wall on the left image 22 series 2. The lung parenchyma demonstrates areas of interstitial thickening primarily within the lung bases. A mild diffuse ground-glass background is appreciated within the lung parenchyma. The central airways are patent. Within the aerated portions of the long no masses are appreciated. A mild area of increased density projects within the posterior base of the right mid to upper hemi thorax greatest confluence appreciated on image 24 series 2. This area is small and likely represents atelectasis or possibly a focal infiltrate.  Evaluation visualized upper abdominal  viscera is partially degraded secondary to metallic beam hardening artifact from the patient's left ventricular assist device. The visualized upper abdominal viscera otherwise demonstrates prominent lymph nodes in the porta hepatis region as well as mildly prominent lymph nodes in the aortocaval region. These findings are appreciated on images 61 series 2 and 64 series 2 respectively.  IMPRESSION: 1. Bilateral pleural effusions with loculated component on the left. These appear to be small. A small pericardial effusion is identified. Component of these findings are likely secondary to patient's recent surgery. 2. Mild interstitial infiltrates. Differential considerations are postop change versus is a component of mild pulmonary edema. An infectious or inflammatory infiltrate cannot be excluded. 3. Cardiomegaly. 4. Atherosclerotic calcifications. There is no evidence of a thoracic aortic aneurysm. 5. Areas likely reflecting reactive lymph nodes in the mediastinum, as well as possibly the porta hepatis and aortic caval regions. Correlation with patient's history in reference to the abdominal lymph nodes is recommended.   Electronically Signed   By: Margaree Mackintosh M.D.   On: 10/02/2013 09:47    CXR today shows bibasilar atelectasis  Medications:     Scheduled Medications: . amiodarone  200 mg Oral Daily  .  amLODipine  10 mg Oral Daily  . aspirin EC  325 mg Oral Daily   Or  . aspirin  324 mg Per Tube Daily  . cephALEXin  500 mg Oral QID  . diphenhydrAMINE  25 mg Oral QHS  . ezetimibe  10 mg Oral Daily  . feeding supplement (ENSURE COMPLETE)  237 mL Oral BID BM  . feeding supplement (PRO-STAT SUGAR FREE 64)  30 mL Oral BID WC  . ferrous Q000111Q C-folic acid  1 capsule Oral q morning - 10a  . hydrALAZINE  10 mg Oral Q8H  . levothyroxine  150 mcg Oral Custom   And  . levothyroxine  75 mcg Oral Custom  . losartan  50 mg Oral Daily  . pantoprazole  40 mg Oral Q1200  . sodium chloride   10-40 mL Intracatheter Q12H  . spironolactone  12.5 mg Oral Daily  . warfarin  4 mg Oral q1800  . Warfarin - Physician Dosing Inpatient   Does not apply q1800    Infusions: . sodium chloride Stopped (09/30/13 0918)  . sodium chloride      PRN Medications: sodium chloride, docusate sodium, hydrALAZINE, levalbuterol, menthol-cetylpyridinium, ondansetron (ZOFRAN) IV, sodium chloride, sodium chloride, traMADol   Assessment:   1. 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 Acute on chronic renal failure, stage III - improved 4. Acute blood loss anemia, expected  - total 4 PRBCs, 1 FFP  5. Persistent fever: resolved, off antibiotics.  6. Acute delirium   Plan/Discussion:    POD # 15.   Doing well. Milrinone off. Unable to get co-ox due to PICC removal. MAPs better. Will get ramp echo today. Possibly home later today pending labs, teaching and ramp echo. Would probably use lasix 40mg  every other day at d/c.   I reviewed the LVAD parameters from today, and compared the results to the patient's prior recorded data. No programming changes were made. The LVAD is functioning within specified parameters. The patient performs LVAD self-test daily. LVAD interrogation was negative for any significant power changes, alarms or PI events/speed drops. LVAD equipment check completed and is in good working order. Back-up equipment present. LVAD education done on emergency procedures and precautions and reviewed exit site care.  Length of Stay: 20 Glori Bickers MD 5:09 AM Advanced Heart Failure Team Pager [phone] (M-F; 7a - 4p)  Please contact [facility] for night-coverage after hours (4p -7a ) and weekends on [email or web address] VAD issues page [phone]

## 2013-10-03 NOTE — Discharge Summary (Signed)
Advanced Heart Failure Team  Discharge Summary   Patient ID: Johnny Oneill MRN: [id number], DOB/AGE: 71/05/1941 71 y.o. Admit date: 09/13/2013 D/C date:     10/03/2013   Primary Discharge Diagnoses:  1) A/C systolic HF - Ischemic CM, EF 20-25% - s/p LVAD HM II implant 09/19/2013; currently on [facility] transplant list  Secondary Discharge Diagnoses:  1) CAD 2) A/C renal failure, stage III - Cr improved to 1.2 on day of discharge 3) Acute blood loss anemia, expected - Received total 5 PRBCs and 1 FFP 4) Fever 5) Acute Delirium  Hospital Course:  Mr.Wohlfarth is a 73 year old patient with a history of CAD s/p stent to LCX 2004 with re-occlusion of LCX with lateral MI 2009 and repeat stenting, total occlusion of RCA, ICM, chronic systolic heart failure, hypothyroidism, HLD and arthritis.  He has been followed closely in the HF clinic and was supported at home for his advanced heart failure with IV milrinone while actively listed on [facility] transplant list since 2014 when he was admitted on 09/13/13 for A/C heart failure with worsening renal failure. He was a planned admit for RHC to assess hemodynamics and to discuss the need to move ahead with LVAD implantation while waiting for heart tx.  His RHC showed well compensated hemodynamics on milrinone with slightly elevated PVR and he was started on sildenafil. While optimizing patient and completing LVAD evaluation he continued to decompensate with his renal function worsening and co-ox decreasing and an IABP was placed for stabilization. He developed a fever while on IABP with signs of early sepsis and was started on broad spectrum abx and low dose norepinephrine. All cultures were negative. His hemodynamics and Cr improved and on 09/18/13 underwent LVAD HM II implantation for BTT. His case was discussed with the transplant team at [facility] and was discussed at the [facility] the prior week and all felt that he was an excellent candidate for VAD  placement with eventual consideration for heart transplantation.  During LVAD implantation his LV was friable and soft and he developed acute bleeding. He was treated with multiple blood products and DDAVP and the bleeding improved. Received a total of 5 PRBCs and 1 FFP. He also developed a R hemothorax and had a CT placed at bedside the morning of POD #1. His IABP was removed and all pressors were weaned slowly following MAPs and co-oxs. He suffered from RV failure after the procedure and required milrinone for >7 days which was gradually weaned to off. During his stay he did develop acute ICU psychosis with delirium which was treated with Zyprexa. Once he was transferred out of the ICU this improved and he returned back to baseline.  He worked with PT/OT, cardiac rehab and the VAD Coordinator extensively and at the time of discharge was comfortable with his equimpment and ambulating > 400 ft with no issues. Before discharge he had a RAMP echo to make sure his speed was optimized and speed was left at 9400 with back-up 8800. He will be followed closely in the HF clinic next week and will have INR this Friday.   LVAD Interrogation HM II:   Speed: 9400 Flow: 4.6 PI: 6.2  Power:  5.0   Back-up speed: 8800  Discharge Weight Range: 151-155 lbs Discharge Vitals: Blood pressure 88/0, pulse 80, temperature 98.3 F (36.8 C), temperature source Oral, resp. rate 20, height 5\' 7"  (1.702 m), weight 151 lb 14.4 oz (68.9 kg), SpO2 95.00%.  Labs: Lab Results  Component  Value Date   WBC 9.4 10/03/2013   HGB 8.9* 10/03/2013   HCT 29.2* 10/03/2013   MCV 88.5 10/03/2013   PLT 341 10/03/2013     Recent Labs Lab 10/03/13 0411  NA 143  K 3.7  CL 107  CO2 23  BUN 25*  CREATININE 1.20  CALCIUM 8.0*  PROT 6.0  BILITOT 0.8  ALKPHOS 138*  ALT 64*  AST 56*  GLUCOSE 102*   Lab Results  Component Value Date   CHOL 59 09/14/2013   HDL 24* 09/14/2013   LDLCALC 22 09/14/2013   TRIG 65 09/14/2013   BNP (last 3  results)  Recent Labs  12/07/12 1200 09/13/13 1349 09/25/13 0400  PROBNP 3310.0* 3883.0* 7019.0*    Diagnostic Studies/Procedures   Ct Chest Wo Contrast  10/02/2013   CLINICAL DATA:  Recent LVAD placement, Elevated BP since surgery Pt denies any chest pain  EXAM: CT CHEST WITHOUT CONTRAST  TECHNIQUE: Multidetector CT imaging of the chest was performed following the standard protocol without IV contrast.  COMPARISON:  DG CHEST 2 VIEW dated 09/28/2013  FINDINGS: Noncontrast evaluation of the thoracic inlet is unremarkable.  Patient is status post median sternotomy.  Artifact consistent with metallic beam hardening is appreciated within the left chest wall, secondary to a dual-chambered AICD . Lead tips projecting in the region of the right atrium and right ventricle.  A left-sided PICC line is appreciated tip projecting in the region of the distal superior vena cava.  A left ventricular assist device is appreciated. There is associated metallic beam hardening artifact limiting visualization. The device appears to be appropriately positioned.  Multichamber cardiac enlargement is identified. Atherosclerotic calcification appreciated within coronary vessels. A small pericardial effusion is appreciated. No definite mediastinal masses nor gross adenopathy is appreciated. Subcentimeter lymph nodes are identified within the prevascular space. A prominent right paratracheal lymph is identified image 22 series 2. Is likely a reactive lymph node considering the patient's history. Otherwisem there is no evidence of mediastinal or hilar adenopathy.  Small bilateral pleural effusions are identified. A loculated component is appreciated within the lateral upper chest wall on the left image 22 series 2. The lung parenchyma demonstrates areas of interstitial thickening primarily within the lung bases. A mild diffuse ground-glass background is appreciated within the lung parenchyma. The central airways are patent. Within  the aerated portions of the long no masses are appreciated. A mild area of increased density projects within the posterior base of the right mid to upper hemi thorax greatest confluence appreciated on image 24 series 2. This area is small and likely represents atelectasis or possibly a focal infiltrate.  Evaluation visualized upper abdominal viscera is partially degraded secondary to metallic beam hardening artifact from the patient's left ventricular assist device. The visualized upper abdominal viscera otherwise demonstrates prominent lymph nodes in the porta hepatis region as well as mildly prominent lymph nodes in the aortocaval region. These findings are appreciated on images 61 series 2 and 64 series 2 respectively.  IMPRESSION: 1. Bilateral pleural effusions with loculated component on the left. These appear to be small. A small pericardial effusion is identified. Component of these findings are likely secondary to patient's recent surgery. 2. Mild interstitial infiltrates. Differential considerations are postop change versus is a component of mild pulmonary edema. An infectious or inflammatory infiltrate cannot be excluded. 3. Cardiomegaly. 4. Atherosclerotic calcifications. There is no evidence of a thoracic aortic aneurysm. 5. Areas likely reflecting reactive lymph nodes in the mediastinum, as well as  possibly the porta hepatis and aortic caval regions. Correlation with patient's history in reference to the abdominal lymph nodes is recommended.   Electronically Signed   By: Margaree Mackintosh M.D.   On: 10/02/2013 09:47    Discharge Medications     Medication List    STOP taking these medications       carvedilol 3.125 MG tablet  Commonly known as:  COREG     hydrALAZINE 25 MG tablet  Commonly known as:  APRESOLINE     isosorbide mononitrate 30 MG 24 hr tablet  Commonly known as:  IMDUR     milrinone 20 MG/100ML Soln infusion  Commonly known as:  PRIMACOR     potassium chloride SA 20 MEQ  tablet  Commonly known as:  K-DUR,KLOR-CON      TAKE these medications       amiodarone 200 MG tablet  Commonly known as:  PACERONE  Take 1 tablet (200 mg total) by mouth 2 (two) times daily.     amLODipine 10 MG tablet  Commonly known as:  NORVASC  Take 1 tablet (10 mg total) by mouth daily.     aspirin 325 MG tablet  Take 325 mg by mouth daily.     cephALEXin 500 MG capsule  Commonly known as:  KEFLEX  Take 1 capsule (500 mg total) by mouth 4 (four) times daily.     CO Q-10 PO  Take 1 capsule by mouth 2 (two) times daily.     DSS 100 MG Caps  Take 200 mg by mouth daily as needed for mild constipation.     ezetimibe 10 MG tablet  Commonly known as:  ZETIA  Take 10 mg by mouth daily.     ferrous Q000111Q C-folic acid capsule  Commonly known as:  TRINSICON / FOLTRIN  Take 1 capsule by mouth every morning.     furosemide 20 MG tablet  Commonly known as:  LASIX  Take 2 tablets (40 mg total) by mouth every other day.     levothyroxine 150 MCG tablet  Commonly known as:  SYNTHROID, LEVOTHROID  75-150 mcg daily before breakfast. Take 1 tablet (150 mcg) daily on an empty stomach except take 1/2 tablet (75 mcg) every Friday as directed.     losartan 50 MG tablet  Commonly known as:  COZAAR  Take 1 tablet (50 mg total) by mouth daily.     nitroGLYCERIN 0.4 MG SL tablet  Commonly known as:  NITROSTAT  Place 0.4 mg under the tongue every 5 (five) minutes as needed. For chest pain     Omega-3 300 MG Caps  Take 300 mg by mouth daily.     pantoprazole 40 MG tablet  Commonly known as:  PROTONIX  Take 1 tablet (40 mg total) by mouth daily at 12 noon.     rosuvastatin 20 MG tablet  Commonly known as:  CRESTOR  Take 20 mg by mouth daily.     spironolactone 25 MG tablet  Commonly known as:  ALDACTONE  Take 1 tablet (25 mg total) by mouth daily.     traMADol 50 MG tablet  Commonly known as:  ULTRAM  Take 1 tablet (50 mg total) by mouth every 6 (six) hours  as needed for moderate pain.     warfarin 5 MG tablet  Commonly known as:  COUMADIN  Take 1 tablet (5 mg total) by mouth daily at 6 PM.        Disposition   The  patient will be discharged in stable condition to home. Discharge Orders   Future Appointments Provider Department Dept Phone   10/10/2013 10:00 AM [facility] [place] [phone]   Future Orders Complete By Expires   ACE Inhibitor / ARB already ordered  As directed    Contraindication beta blocker-discharge  As directed    Diet - low sodium heart healthy  As directed    Heart Failure patients record your daily weight using the same scale at the same time of day  As directed    Increase activity slowly  As directed    STOP any activity that causes chest pain, shortness of breath, dizziness, sweating, or exessive weakness  As directed      Follow-up Information   Follow up with Glori Bickers, MD On 10/10/2013. (@ 10:00 am; [facility] code 0030)    Specialty:  Cardiology   Contact information:   [address] [place] [address] [phone]         Duration of Discharge Encounter: Greater than 35 minutes   Signed, Rande Brunt NP-C 10/03/2013, 12:40 PM  Patient seen and examined with Junie Bame, NP. We discussed all aspects of the encounter. I agree with the assessment and plan as stated above. I have edited the note to reflect my changes.  Benay Spice 5:41 PM

## 2013-10-03 NOTE — Progress Notes (Signed)
CSW met with patient at bedside. Patient was sitting in recliner chair and dressed in hospital gown. Patient reports anticipated discharge today and looking forward to getting home. He states his wife will transport home and feels confident he is ready for discharge. Patient reviewed changing power cords with staff and verbalizes readiness for home. CSW will follow up with patient in Heart Failure Clinic next week. Raquel Sarna, [place]

## 2013-10-03 NOTE — Progress Notes (Signed)
Assessment unchanged. Dicussed D/C instructions with pt and wife including f/u appointments, medications, and LVAD resources. Pt educated of inability to shower, need of recording weight, maintaining a heart healthy diet, and when to call MD.  Verbalized understanding. Pt and wife able to verbalize what to do when asleep, if LVAD has a red alarm, if power goes out, when to bring backup bag, and what to keep in bag.  Pt able to switch to batteries.  IV and tele removed. RX given to patient. Pt left via W/C with belongings and LVAD equipment.

## 2013-10-03 NOTE — Progress Notes (Signed)
Patient reconnect to A/C wall unit from batteries; tv was turned off and patient was alone; less distracted this time; minimal verbal cues.  Nolon Nations, RN

## 2013-10-03 NOTE — Progress Notes (Signed)
Cannot draw carboyhemoglobin at this time as central line is needed for blood draw.  Pt central line was d/c yesterday. RN consulted IV team, Resp. Therapy, and Lab to verify.  Will continue to monitor.  Nolon Nations, RN

## 2013-10-03 NOTE — Progress Notes (Signed)
Patient ID: Johnny Oneill, male   DOB: 08/30/1941, 73 y.o.   MRN: [id number] HeartMate 2 Rounding Note  Subjective:    JohnnyOneill is a 73 year old patient with ischemic cardiomyopathy with advanced heart failure on the transplant waiting list at [facility] and supported with home milrinone since April 2014. On this admission he was admitted with acute on chronic heart failure was found to be in cardiogenic shock which responded only to increased dose of milrinone and placement of intra-aortic balloon pump. The patient was discussed at the [facility] conference and felt to be an appropriate candidate for LVAD implantation as a bridge to transplantation. Prior to surgery the details of LVAD implantation were thoroughly discussed with patient and his wife including the expected postop recovery and  the long-term postoperative care and followup.    Intraoperative and postoperative course complicated by friable LV myocardium at the apical cannulation site resulting in bleeding from suture tears in the myocardium repaired by multiple extra pledgeted sutures placed before separating from cardio- pulmonary bypass.   Now POD # 15 LVAD implantation   the patient slept well and is ready to go home  MAP is now well controlled with adjusted medications Surgical incisions clean- less drainage(fat necrosis) in driveline dressing  Patient Wt 156lbs, stable  PICC line removed, echocardiogram shows optimal speed at 9400 RPM. CT chest reviewed and shows small pleural effusions  LVAD INTERROGATION:  HeartMate II LVAD:  Flow 4.0 liters/min, speed 9400, power 5.3, PI 7 .2  Objective:    Vital Signs:   Temp:  [97.9 F (36.6 C)-98.3 F (36.8 C)] 98.3 F (36.8 C) (02/03 1201) Pulse Rate:  [80-84] 80 (02/03 1200) Resp:  [20] 20 (02/02 2043) SpO2:  [92 %-97 %] 95 % (02/03 1201) Weight:  [151 lb 14.4 oz (68.9 kg)] 151 lb 14.4 oz (68.9 kg) (02/03 0500) Last BM Date: 10/03/13 Mean arterial Pressure 90-100,  Max 106 this am  Intake/Output:   Intake/Output Summary (Last 24 hours) at 10/03/13 1445 Last data filed at 10/03/13 0800  Gross per 24 hour  Intake    240 ml  Output    200 ml  Net     40 ml     Physical Exam: General:  Much more animated this am HEENT: normal Neck: supple. Carotids 2+ bilat; no bruits. No lymphadenopathy or thryomegaly appreciated. Cor:  LVAD hum present. Difficult to hear native heart sounds Lungs: Decreased breath sounds in lower lobes with crackles Abdomen: soft, nontender, nondistended. No hepatosplenomegaly. No bruits or masses. Good bowel sounds. Small amount of drainage present in driveline dressing today. Extremities: no cyanosis, clubbing, rash, edema Neuro: alert & orientedx3 but mentation a little slow, cranial nerves grossly intact. moves all 4 extremities w/o difficulty. Affect good  Telemetry: sinus 88  Labs: Basic Metabolic Panel:  Recent Labs Lab 09/29/13 0500 09/30/13 0658 10/01/13 0500 10/02/13 0500 10/03/13 0411  NA 143 144 145 145 143  K 4.3 4.0 4.1 4.0 3.7  CL 107 109 110 108 107  CO2 23 23 24 24 23   GLUCOSE 93 107* 86 85 102*  BUN 21 25* 24* 23 25*  CREATININE 1.33 1.41* 1.28 1.13 1.20  CALCIUM 8.0* 7.8* 8.0* 8.0* 8.0*    Liver Function Tests:  Recent Labs Lab 09/29/13 0500 09/30/13 0658 10/01/13 0500 10/02/13 0500 10/03/13 0411  AST 63* 64* 78* 63* 56*  ALT 81* 73* 83* 72* 64*  ALKPHOS 119* 123* 137* 135* 138*  BILITOT 1.3* 1.0 1.0 0.9  0.8  PROT 6.0 5.9* 6.3 6.3 6.0  ALBUMIN 2.2* 2.0* 2.2* 2.2* 2.2*   No results found for this basename: LIPASE, AMYLASE,  in the last 168 hours No results found for this basename: AMMONIA,  in the last 168 hours  CBC:  Recent Labs Lab 09/29/13 0500 09/30/13 0658 10/01/13 0500 10/02/13 0500 10/03/13 0411  WBC 12.4* 10.4 10.4 9.4 9.4  HGB 9.3* 9.0* 9.8* 9.4* 8.9*  HCT 30.0* 29.2* 31.9* 30.1* 29.2*  MCV 88.0 88.2 89.1 88.0 88.5  PLT 331 340 357 340 341    INR:  Recent  Labs Lab 09/29/13 0500 09/30/13 0658 10/01/13 0500 10/02/13 0500 10/03/13 0411  INR 1.94* 2.15* 2.31* 2.11* 2.10*  LDH 495   Other results:  EKG:   Imaging: Ct Chest Wo Contrast  10/02/2013   CLINICAL DATA:  Recent LVAD placement, Elevated BP since surgery Pt denies any chest pain  EXAM: CT CHEST WITHOUT CONTRAST  TECHNIQUE: Multidetector CT imaging of the chest was performed following the standard protocol without IV contrast.  COMPARISON:  DG CHEST 2 VIEW dated 09/28/2013  FINDINGS: Noncontrast evaluation of the thoracic inlet is unremarkable.  Patient is status post median sternotomy.  Artifact consistent with metallic beam hardening is appreciated within the left chest wall, secondary to a dual-chambered AICD . Lead tips projecting in the region of the right atrium and right ventricle.  A left-sided PICC line is appreciated tip projecting in the region of the distal superior vena cava.  A left ventricular assist device is appreciated. There is associated metallic beam hardening artifact limiting visualization. The device appears to be appropriately positioned.  Multichamber cardiac enlargement is identified. Atherosclerotic calcification appreciated within coronary vessels. A small pericardial effusion is appreciated. No definite mediastinal masses nor gross adenopathy is appreciated. Subcentimeter lymph nodes are identified within the prevascular space. A prominent right paratracheal lymph is identified image 22 series 2. Is likely a reactive lymph node considering the patient's history. Otherwisem there is no evidence of mediastinal or hilar adenopathy.  Small bilateral pleural effusions are identified. A loculated component is appreciated within the lateral upper chest wall on the left image 22 series 2. The lung parenchyma demonstrates areas of interstitial thickening primarily within the lung bases. A mild diffuse ground-glass background is appreciated within the lung parenchyma. The central  airways are patent. Within the aerated portions of the long no masses are appreciated. A mild area of increased density projects within the posterior base of the right mid to upper hemi thorax greatest confluence appreciated on image 24 series 2. This area is small and likely represents atelectasis or possibly a focal infiltrate.  Evaluation visualized upper abdominal viscera is partially degraded secondary to metallic beam hardening artifact from the patient's left ventricular assist device. The visualized upper abdominal viscera otherwise demonstrates prominent lymph nodes in the porta hepatis region as well as mildly prominent lymph nodes in the aortocaval region. These findings are appreciated on images 61 series 2 and 64 series 2 respectively.  IMPRESSION: 1. Bilateral pleural effusions with loculated component on the left. These appear to be small. A small pericardial effusion is identified. Component of these findings are likely secondary to patient's recent surgery. 2. Mild interstitial infiltrates. Differential considerations are postop change versus is a component of mild pulmonary edema. An infectious or inflammatory infiltrate cannot be excluded. 3. Cardiomegaly. 4. Atherosclerotic calcifications. There is no evidence of a thoracic aortic aneurysm. 5. Areas likely reflecting reactive lymph nodes in the mediastinum, as well  as possibly the porta hepatis and aortic caval regions. Correlation with patient's history in reference to the abdominal lymph nodes is recommended.   Electronically Signed   By: Margaree Mackintosh M.D.   On: 10/02/2013 09:47    CXR today shows bibasilar atelectasis  Medications:     Scheduled Medications: . amiodarone  200 mg Oral Daily  . amLODipine  10 mg Oral Daily  . aspirin EC  325 mg Oral Daily   Or  . aspirin  324 mg Per Tube Daily  . cephALEXin  500 mg Oral QID  . diphenhydrAMINE  25 mg Oral QHS  . ezetimibe  10 mg Oral Daily  . feeding supplement (ENSURE  COMPLETE)  237 mL Oral BID BM  . feeding supplement (PRO-STAT SUGAR FREE 64)  30 mL Oral BID WC  . ferrous Q000111Q C-folic acid  1 capsule Oral q morning - 10a  . hydrALAZINE  10 mg Oral Q8H  . levothyroxine  150 mcg Oral Custom   And  . levothyroxine  75 mcg Oral Custom  . losartan  50 mg Oral Daily  . pantoprazole  40 mg Oral Q1200  . sodium chloride  10-40 mL Intracatheter Q12H  . spironolactone  12.5 mg Oral Daily  . warfarin  5 mg Oral Once  . Warfarin - Physician Dosing Inpatient   Does not apply q1800    Infusions: . sodium chloride Stopped (09/30/13 0918)  . sodium chloride      PRN Medications: sodium chloride, docusate sodium, hydrALAZINE, levalbuterol, menthol-cetylpyridinium, ondansetron (ZOFRAN) IV, sodium chloride, sodium chloride, traMADol   Assessment:   1 acute on chronic systolic heart failure EF 20% requiring implantable LVAD January 19,015  2 ischemic cardiomyopathy  3 perioperative coagulopathy now improved  4 ventilator dependent respiratory insufficiency due to pulmonary edema, possible ARDS from cardio pulmonary bypass-now resolved and patient is extubated  5 postop delerium, now resolved with better sleep  Plan/Discussion:    continue speed at 9400 RPM- RAMP echo done  INR at 2.0. Will give 5 mg coumadin today  DC home today  Predischarge CT chest shows good orientation of LVAD, small right pleural effusion, small left pleural effusion  Plan  for discharge today- HHN to draw INR in 2 days, HF clinic in 6 days      I reviewed the LVAD parameters from today, and compared the results to the patient's prior recorded data.  No programming changes were made.  The LVAD is functioning within specified parameters.  The patient performs LVAD self-test daily.  LVAD interrogation was negative for any significant power changes, alarms or PI events/speed drops.  LVAD equipment check completed and is in good working order.  Back-up equipment  present.   LVAD education done on emergency procedures and precautions and reviewed exit site care.  Length of Stay: 20  VAN TRIGT III,PETER 10/03/2013, 2:45 PM

## 2013-10-03 NOTE — Progress Notes (Signed)
Ed completed with pt and wife. Voiced understanding and requests his name be sent to [place]. Pt very calm and receptive. System controller not secure upon arrival as pt was donning pants. Reminded pt and wife to always secure.  [place], ACSM 10/03/2013 1:56 PM

## 2013-10-03 NOTE — Progress Notes (Signed)
Echocardiogram 2D Echocardiogram has been performed.  Zebulen, Gallen 10/03/2013, 11:36 AM

## 2013-10-05 ENCOUNTER — Other Ambulatory Visit (HOSPITAL_COMMUNITY): Payer: Self-pay | Admitting: *Deleted

## 2013-10-05 ENCOUNTER — Encounter (HOSPITAL_COMMUNITY): Payer: Self-pay | Admitting: *Deleted

## 2013-10-05 DIAGNOSIS — Z7901 Long term (current) use of anticoagulants: Secondary | ICD-10-CM

## 2013-10-05 DIAGNOSIS — Z95811 Presence of heart assist device: Secondary | ICD-10-CM

## 2013-10-05 LAB — WOUND CULTURE
Culture: NO GROWTH
Gram Stain: NONE SEEN

## 2013-10-06 ENCOUNTER — Ambulatory Visit (HOSPITAL_COMMUNITY): Payer: Self-pay | Admitting: *Deleted

## 2013-10-06 DIAGNOSIS — I509 Heart failure, unspecified: Secondary | ICD-10-CM

## 2013-10-06 DIAGNOSIS — I2510 Atherosclerotic heart disease of native coronary artery without angina pectoris: Secondary | ICD-10-CM

## 2013-10-06 DIAGNOSIS — I5023 Acute on chronic systolic (congestive) heart failure: Secondary | ICD-10-CM

## 2013-10-06 DIAGNOSIS — I129 Hypertensive chronic kidney disease with stage 1 through stage 4 chronic kidney disease, or unspecified chronic kidney disease: Secondary | ICD-10-CM

## 2013-10-06 LAB — PROTIME-INR: INR: 2 — AB (ref <=1.1)

## 2013-10-10 ENCOUNTER — Ambulatory Visit (HOSPITAL_COMMUNITY)
Admit: 2013-10-10 | Discharge: 2013-10-10 | Disposition: A | Discharge status: Home / Self Care | Payer: Medicare Other | Attending: Internal Medicine | Admitting: Internal Medicine

## 2013-10-10 ENCOUNTER — Ambulatory Visit (HOSPITAL_COMMUNITY): Payer: Self-pay | Admitting: *Deleted

## 2013-10-10 ENCOUNTER — Encounter: Payer: Self-pay | Admitting: Licensed Clinical Social Worker

## 2013-10-10 VITALS — BP 88/0 | HR 83 | Ht 69.0 in | Wt 150.8 lb

## 2013-10-10 DIAGNOSIS — I2510 Atherosclerotic heart disease of native coronary artery without angina pectoris: Secondary | ICD-10-CM | POA: Insufficient documentation

## 2013-10-10 DIAGNOSIS — I4510 Unspecified right bundle-branch block: Secondary | ICD-10-CM | POA: Insufficient documentation

## 2013-10-10 DIAGNOSIS — Z950 Presence of cardiac pacemaker: Secondary | ICD-10-CM | POA: Insufficient documentation

## 2013-10-10 DIAGNOSIS — Z95811 Presence of heart assist device: Secondary | ICD-10-CM

## 2013-10-10 DIAGNOSIS — I10 Essential (primary) hypertension: Secondary | ICD-10-CM | POA: Insufficient documentation

## 2013-10-10 DIAGNOSIS — I5022 Chronic systolic (congestive) heart failure: Secondary | ICD-10-CM

## 2013-10-10 DIAGNOSIS — I509 Heart failure, unspecified: Secondary | ICD-10-CM | POA: Insufficient documentation

## 2013-10-10 DIAGNOSIS — I428 Other cardiomyopathies: Secondary | ICD-10-CM | POA: Insufficient documentation

## 2013-10-10 DIAGNOSIS — I252 Old myocardial infarction: Secondary | ICD-10-CM | POA: Insufficient documentation

## 2013-10-10 DIAGNOSIS — I2589 Other forms of chronic ischemic heart disease: Secondary | ICD-10-CM | POA: Insufficient documentation

## 2013-10-10 DIAGNOSIS — Z79899 Other long term (current) drug therapy: Secondary | ICD-10-CM | POA: Insufficient documentation

## 2013-10-10 DIAGNOSIS — I4949 Other premature depolarization: Secondary | ICD-10-CM | POA: Insufficient documentation

## 2013-10-10 DIAGNOSIS — E039 Hypothyroidism, unspecified: Secondary | ICD-10-CM | POA: Insufficient documentation

## 2013-10-10 DIAGNOSIS — Z7901 Long term (current) use of anticoagulants: Secondary | ICD-10-CM

## 2013-10-10 LAB — PROTIME-INR
INR: 2.54 — ABNORMAL HIGH (ref 0.00–1.49)
Prothrombin Time: 26.5 seconds — ABNORMAL HIGH (ref 11.6–15.2)

## 2013-10-10 LAB — BASIC METABOLIC PANEL
BUN: 23 mg/dL (ref 6–23)
CO2: 25 mEq/L (ref 19–32)
Calcium: 8.4 mg/dL (ref 8.4–10.5)
Chloride: 105 mEq/L (ref 96–112)
Creatinine, Ser: 1.23 mg/dL (ref 0.50–1.35)
GFR calc Af Amer: 67 mL/min — ABNORMAL LOW (ref >90)
GFR calc non Af Amer: 58 mL/min — ABNORMAL LOW (ref >90)
Glucose, Bld: 134 mg/dL — ABNORMAL HIGH (ref 70–99)
Potassium: 4.1 mEq/L (ref 3.7–5.3)
Sodium: 142 mEq/L (ref 137–147)

## 2013-10-10 LAB — CBC
HCT: 35.6 % — ABNORMAL LOW (ref 39.0–52.0)
Hemoglobin: 10.9 g/dL — ABNORMAL LOW (ref 13.0–17.0)
MCH: 26.8 pg (ref 26.0–34.0)
MCHC: 30.6 g/dL (ref 30.0–36.0)
MCV: 87.5 fL (ref 78.0–100.0)
Platelets: 238 10*3/uL (ref 150–400)
RBC: 4.07 MIL/uL — ABNORMAL LOW (ref 4.22–5.81)
RDW: 19.1 % — ABNORMAL HIGH (ref 11.5–15.5)
WBC: 7.4 10*3/uL (ref 4.0–10.5)

## 2013-10-10 LAB — LACTATE DEHYDROGENASE: LDH: 442 U/L — ABNORMAL HIGH (ref 94–250)

## 2013-10-10 NOTE — Progress Notes (Signed)
Symptom  Yes  No  Details   Angina        x  Ativity:   Claudication        x How far:   Syncope        x When:  Dizzy spell this am at clinic  Stroke        x   Orthopnea        x How many pillows:   one  PND        x How often:  CPAP    N/A  How many hrs:   Pedal edema       x       1 - 2 days after discharge home - cleared after Lasix  Abd fullness       x   Sunday - cleared after Lasix dose  N&V        x  good appetite; early satiety  Diaphoresis        x When:  Bleeding     x       Nostril x 1 when blowing nose  Urine color        light yellow  SOB            x Activity:     Palpitations        x When:  ICD shock        x   Hospitlizaitons        x  When/where/why:  1/14 - 10/03/13 VAD implant  ED visit        x When/where/why:  Other MD        x When/who/why:  Activity     Rehab 2 x week (evaluation yesterday)  Fluid     No limitations < 2000  Diet     Low salt   Vital signs: HR: 83 MAP BP:  88 O2 Sat:  99 Wt:  150.8 lbs Last wt: hospital discharge 153 lbs Ht: 5'9"  LVAD interrogation reveals:  Speed:  9400 Flow:  3.3 Power:  4.8 PI: 2.8 - 5.1 Alarms:  none Events:  Rare PI Fixed speed:  9400 Low speed limit:  8800  LVAD exit site:  Well healed and incorporated. The velour is fully implanted at exit site. Gauze dressing with aquacel strip dry and intact. Dressing change using sterile technique, remains erythematous under aquacel strip. Dr. Darcey [name] removed remaining suture. Small amount serosan drainage. Stabilization device present and accurately applied. Driveline dressing is being changed daily per sterile technique using guaze dressing with aquacel strip on exit site. Pt denies fever or chills. Pt/caregiver state they have adequate dressing supplies at home.   I reviewed the LVAD parameters from today, and compared the results to the patient's prior recorded data. No programming changes were made. The LVAD is functioning within specified parameters.  LVAD  interrogation was negative for any significant power changes or alarms; a few PI events/speed drops present which is consistent with the patient's history.   Pt/caregiver deny any alarms or VAD equipment issues. VAD coordinator reviewed daily log from home for daily temperature, weight, and VAD parameters. Pt is performing daily controller and system monitor self tests along with completing weekly and monthly maintenance for LVAD equipment.   LVAD equipment check completed and is in good working order. Back-up equipment present. LVAD education done on emergency procedures and precautions and reviewed exit site care.

## 2013-10-10 NOTE — Progress Notes (Signed)
CSW met with patient and wife in the clinic. Patient was dressed appropriately, well groomed and ambulating with a rolling walker. He stated that he is feeling well and making improvements. He noted several times that "I have a good nurse taking care of me" and smiled at his wife. Patient's wife reported that that she is feeling well and has been"keeping him on track" with his care needs, medications and activity. Wife states she is taking some time for herself to help her manage the care needs of patient. Patient and wife appear to be coping well with their new life as a LVAD recipient and caregiver. CSW offered support and encouragement and will continue to follow through recovery. Jackie , LCSW [phone]  

## 2013-10-10 NOTE — Patient Instructions (Signed)
1.  Take lasix 40 mg only if weight > 146 lbs 2.  No change in coumadin dose 3.  Return to [place] clinic in 1 week

## 2013-10-13 ENCOUNTER — Other Ambulatory Visit (HOSPITAL_COMMUNITY): Payer: Self-pay | Admitting: *Deleted

## 2013-10-13 DIAGNOSIS — Z95811 Presence of heart assist device: Secondary | ICD-10-CM

## 2013-10-13 DIAGNOSIS — Z7901 Long term (current) use of anticoagulants: Secondary | ICD-10-CM

## 2013-10-16 ENCOUNTER — Ambulatory Visit (HOSPITAL_COMMUNITY)
Admission: RE | Admit: 2013-10-16 | Discharge: 2013-10-16 | Disposition: A | Discharge status: Home / Self Care | Payer: Medicare Other | Source: Ambulatory Visit | Attending: Internal Medicine | Admitting: Internal Medicine

## 2013-10-16 ENCOUNTER — Ambulatory Visit (HOSPITAL_COMMUNITY): Payer: Self-pay | Admitting: *Deleted

## 2013-10-16 VITALS — BP 100/0 | HR 77 | Ht 69.0 in | Wt 157.4 lb

## 2013-10-16 DIAGNOSIS — Z95811 Presence of heart assist device: Secondary | ICD-10-CM

## 2013-10-16 DIAGNOSIS — Z7901 Long term (current) use of anticoagulants: Secondary | ICD-10-CM

## 2013-10-16 DIAGNOSIS — I4729 Other ventricular tachycardia: Secondary | ICD-10-CM

## 2013-10-16 DIAGNOSIS — I5022 Chronic systolic (congestive) heart failure: Secondary | ICD-10-CM

## 2013-10-16 DIAGNOSIS — I472 Ventricular tachycardia: Secondary | ICD-10-CM

## 2013-10-16 DIAGNOSIS — I5023 Acute on chronic systolic (congestive) heart failure: Secondary | ICD-10-CM

## 2013-10-16 DIAGNOSIS — Z95810 Presence of automatic (implantable) cardiac defibrillator: Secondary | ICD-10-CM | POA: Insufficient documentation

## 2013-10-16 DIAGNOSIS — I509 Heart failure, unspecified: Secondary | ICD-10-CM | POA: Insufficient documentation

## 2013-10-16 DIAGNOSIS — I2589 Other forms of chronic ischemic heart disease: Secondary | ICD-10-CM

## 2013-10-16 LAB — PROTIME-INR
INR: 3.8 — ABNORMAL HIGH (ref 0.00–1.49)
Prothrombin Time: 36 seconds — ABNORMAL HIGH (ref 11.6–15.2)

## 2013-10-16 LAB — BASIC METABOLIC PANEL
BUN: 16 mg/dL (ref 6–23)
CO2: 24 mEq/L (ref 19–32)
Calcium: 8.5 mg/dL (ref 8.4–10.5)
Chloride: 104 mEq/L (ref 96–112)
Creatinine, Ser: 1.21 mg/dL (ref 0.50–1.35)
GFR calc Af Amer: 68 mL/min — ABNORMAL LOW (ref >90)
GFR calc non Af Amer: 59 mL/min — ABNORMAL LOW (ref >90)
Glucose, Bld: 107 mg/dL — ABNORMAL HIGH (ref 70–99)
Potassium: 3.9 mEq/L (ref 3.7–5.3)
Sodium: 141 mEq/L (ref 137–147)

## 2013-10-16 LAB — LACTATE DEHYDROGENASE: LDH: 405 U/L — ABNORMAL HIGH (ref 94–250)

## 2013-10-16 LAB — CBC
HCT: 35.5 % — ABNORMAL LOW (ref 39.0–52.0)
Hemoglobin: 11.2 g/dL — ABNORMAL LOW (ref 13.0–17.0)
MCH: 27.2 pg (ref 26.0–34.0)
MCHC: 31.5 g/dL (ref 30.0–36.0)
MCV: 86.2 fL (ref 78.0–100.0)
Platelets: 257 10*3/uL (ref 150–400)
RBC: 4.12 MIL/uL — ABNORMAL LOW (ref 4.22–5.81)
RDW: 19 % — ABNORMAL HIGH (ref 11.5–15.5)
WBC: 6.2 10*3/uL (ref 4.0–10.5)

## 2013-10-16 MED ORDER — FUROSEMIDE 20 MG PO TABS: 20.0000 mg | ORAL_TABLET | ORAL | Status: DC

## 2013-10-16 MED ORDER — LOSARTAN POTASSIUM 100 MG PO TABS: 100.0000 mg | ORAL_TABLET | Freq: Every day | ORAL | Status: DC

## 2013-10-16 NOTE — Progress Notes (Signed)
Patient ID: Johnny Oneill, male   DOB: October 09, 1940, 73 y.o.   MRN: [id number]   HPI: Johnny Oneill is a 74 year old patient with a history of CAD s/p stent to LCX 2004 with re-occlusion of LCX with lateral MI 2009 and repeat stenting, total occlusion of RCA, ICM, chronic systolic heart failure, hypothyroidism, HLD and arthritis. S/P LVAD HMII 09/19/2013 currently [facility] Transplant list .   Admitted to [facility] 09/13/13 through 10/03/13. Initially admitted with decompenstaed heart failure and low output. Required IABP for stabilization. He ultimately had LVAD HM II placed on 09/19/13. He suffered from RV failure after the procedure and required milrinone for >7 days which was gradually weaned to off. During his stay he did develop acute ICU psychosis with delirium which was treated with Zyprexa. Once he was transferred out of the ICU this improved and he returned back to baseline. He was not discharged on beta blocker. D/C weight 151 pounds.  He returns for follow up with his wife. Overall he is feeling much better. Very active. Denies SOB/PND/Orthopnea. He has had one dizzy episode after sitting for one hour. Not needing lasix.  Weight at home 151-155 pounds. Following low salt diet and limiting fluids to < 2 liters per day. His performs dressing changes.   Denies LVAD alarms.  Denies driveline trauma, erythema or drainage.  Denies ICD shocks.   Reports taking Coumadin as prescribed and adherence to anticoagulation based dietary restrictions.  Denies bright red blood per rectum or melena, no dark urine or hematuria.     Past Medical History  Diagnosis Date  . Ischemic cardiomyopathy     a. 10/09 Echo: Sev LV Dysfxn, inf/lat AK, Mod MR.  [name];  s/p Lat MI 2009 with occlusion of the LCX - treated with Promus stenting. Has total occlusion of the RCA.   . ICD  (implantable cardiac defibrillator) in place     PROPHYLACTIC      medtronic  . RBBB   . 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 Hypertension   . PVC (premature ventricular contraction)   . Pacemaker   . CHF (congestive heart failure)   . Shortness of breath   . Automatic implantable cardioverter-defibrillator in situ     Current Outpatient Prescriptions  Medication Sig Dispense Refill  . amiodarone (PACERONE) 200 MG tablet Take 1 tablet (200 mg total) by mouth 2 (two) times daily.  60 tablet  6  . amLODipine (NORVASC) 10 MG tablet Take 1 tablet (10 mg total) by mouth daily.  30 tablet  3  . aspirin 325 MG tablet Take 325 mg by mouth daily.        . Coenzyme Q10 (CO Q-10 PO) Take 1 capsule by mouth daily.       [name] ezetimibe (ZETIA) 10 MG tablet Take 10 mg by mouth daily.      . ferrous Q000111Q C-folic acid (TRINSICON / FOLTRIN) capsule Take 1 capsule by mouth every morning.  30 capsule  3  . levothyroxine (SYNTHROID, LEVOTHROID) 150 MCG tablet 75-150 mcg daily before breakfast. Take 1 tablet (150 mcg) daily on an empty stomach except take 1/2 tablet (75 mcg) every Friday as directed.      . Omega-3 300 MG CAPS Take 300 mg by mouth daily.       [name]  pantoprazole (PROTONIX) 40 MG tablet Take 1 tablet (40 mg total) by mouth daily at 12 noon.  30 tablet  6  . rosuvastatin (CRESTOR) 20 MG tablet Take 20 mg by mouth daily.      [name] spironolactone (ALDACTONE) 25 MG tablet Take 1 tablet (25 mg total) by mouth daily.  30 tablet  3  . warfarin (COUMADIN) 5 MG tablet Take 1 tablet (5 mg total) by mouth daily at 6 PM.  30 tablet  3  . docusate sodium 100 MG CAPS Take 200 mg by mouth daily as needed for mild constipation.  15 capsule  3  . furosemide (LASIX) 20 MG tablet Take 1 tablet (20 mg total) by mouth every other day. Take one time weekly; may take extra dose if weight gain > 3 lbs  30 tablet  6  . losartan (COZAAR) 100 MG tablet Take 1 tablet (100 mg total) by mouth daily.  90 tablet  3  .  nitroGLYCERIN (NITROSTAT) 0.4 MG SL tablet Place 0.4 mg under the tongue every 5 (five) minutes as needed. For chest pain      . traMADol (ULTRAM) 50 MG tablet Take 1 tablet (50 mg total) by mouth every 6 (six) hours as needed for moderate pain.  30 tablet  0   No current facility-administered medications for this encounter.    Diovan and Lipitor  REVIEW OF SYSTEMS: All systems negative except as listed in HPI, PMH and Problem list.   LVAD INTERROGATION:   HeartMate II LVAD:  Flow 4.4  liters/min, speed 9400, power 5.5, PI 6.2. Rare PI  Fixed Speed 9400 Back UP Speed 8800. Rare PI events  I reviewed the LVAD parameters from today, and compared the results to the patient's prior recorded data.  No programming changes were made.  The LVAD is functioning within specified parameters.  The patient performs LVAD self-test daily.  LVAD interrogation was negative for any significant power changes, alarms or PI events/speed drops.  LVAD equipment check completed and is in good working order.  Back-up equipment present.   LVAD education done on emergency procedures and precautions and reviewed exit site care.    Filed Vitals:   10/16/13 1034  BP: 100/0  Pulse: 77  Height: 5\' 9"  (1.753 m)  Weight: 157 lb 6.4 oz (71.396 kg)  SpO2: 97%    Physical Exam: GENERAL: Well appearing, male who presents to clinic today in no acute distress with his wife  HEENT: normal  NECK: Supple, JVP 7 + prominent CV waves  .  2+ bilaterally, no bruits.  No lymphadenopathy or thyromegaly appreciated.   CARDIAC:  Mechanical heart sounds with LVAD hum present.  LUNGS:  Clear to auscultation bilaterally.  ABDOMEN:  Soft, round, nontender, positive bowel sounds x4.     LVAD exit site: well-healed and incorporated.  Dressing dry and intact.  No erythema or drainage.  Stabilization device present and accurately applied.  Driveline dressing is being changed daily per sterile technique. EXTREMITIES:  Warm and dry, no  cyanosis, clubbing, rash or edema  NEUROLOGIC:  Alert and oriented x 4.  Gait steady.  No aphasia.  No dysarthria.  Affect pleasant.       ASSESSMENT AND PLAN:   1. Status post LVAD implantation 09/19/13  Patient comes to clinic today for routine follow up visit. Currently has NYHA class II symptoms on LVAD support. Volume status stable. Continue lasix 20 mg every Monday and as needed.  Continue 25 mg spironolactone daily.  MAP 100. Increase losartan to 100 mg daily. Consider adding carvedilol at next visit. Continue rehab twice a week. Check BMET, LDH, CBC, INR today  Plan to follow up at next routine visit.      2.  Anticoagulation management - INR goal 2.0-3.0.  Will evaluate INR value today and follow up with patient for necessary changes.  Dose per protocol.   Follow up in 2 weeks consider adding BB at next visit.   Johnny Oneill,Johnny Oneill 6:18 PM  Patient seen and examined with Johnny Grinder, NP. We discussed all aspects of the encounter. I agree with the assessment and plan as stated above. I interrogated VAD personally.   He looks great s/p VAD implant. NYHA I-II. Volume status and MAP slightly elevated. Will take lasix 20 mg once a week (Monday). Will increase losartan to 100 daily. Check labs today. VAD interrogation looks good.   Johnny Bensimhon,MD 10:50 PM

## 2013-10-16 NOTE — Progress Notes (Signed)
Symptom  Yes  No  Details   Angina        x  Ativity:   Claudication        x How far:   Syncope        x When:  One dizzy spell with orthostatic change  Stroke        x   Orthopnea        x How many pillows:   one  PND        x How often:  CPAP    N/A  How many hrs:   Pedal edema             x     Abd fullness             x  Sunday - cleared after Lasix dose  N&V        x  good appetite  Diaphoresis        x When:  Bleeding           x     Urine color        light yellow  SOB            x Activity:     Palpitations        x When:  ICD shock        x   Hospitlizaitons              x When/where/why:    ED visit        x When/where/why:  Other MD        x When/who/why:  Activity     Rehab 2 x week   Fluid     No limitations < 2000  Diet     Low salt   Vital signs: HR: 77 MAP BP:  100 O2 Sat:  97 Wt:  157.4  lbs Last wt: 150.8 Ht: 5'9"  LVAD interrogation reveals:  Speed:  9400 Flow:  4.4 Power:  5.5 PI: 6.2 Alarms:  none Events:  Rare PI Fixed speed:  9400 Low speed limit:  8800  LVAD exit site:  Well healed and incorporated. The velour is fully implanted at exit site. Gauze dressing with aquacel strip dry and intact. Dressing change using sterile technique, remains erythematous under aquacel strip, small amount drainage with no foul odor noted; pt denies tenderness.  Stabilization device present and accurately applied. Driveline dressing is being changed daily per sterile technique using guaze dressing with aquacel strip on exit site. Pt denies fever or chills. Pt/caregiver state they have adequate dressing supplies at home.  I reviewed the LVAD parameters from today, and compared the results to the patient's prior recorded data. No programming changes were made. The LVAD is functioning within specified parameters.  LVAD interrogation was negative for any significant power changes or alarms; a few PI events/speed drops present which is consistent with the patient's  history.   Pt/caregiver deny any alarms or VAD equipment issues. VAD coordinator reviewed daily log from home for daily temperature, weight, and VAD parameters. Pt is performing daily controller and system monitor self tests along with completing weekly and monthly maintenance for LVAD equipment.   LVAD equipment check completed and is in good working order. Back-up equipment present. LVAD education done on emergency procedures and precautions and reviewed exit site care.

## 2013-10-16 NOTE — Progress Notes (Signed)
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   Follow up:  Here for first post-hospital visit following VAD implant. Post VAD course c/b ICU psychosis and RH failure requiring milrinone which was weaned off prior to discharge. Doing well at home. Denies SOB, orthopnea, CP, or edema.  Home weight has been stable; noted 4 lb weight loss after last Lasix dose Sunday; is currently taking Lasix 40 mg QOD and Aldactone 25 mg daily.  Pt became pale and had orthostatic dizziness this morning during clinic visit after blood draw and "standing up too fast"; BP prior to this episode was MAP 88 with PI 7.0; during episode, BP was 70 with PI drop 2.8. Pt recovered with sitting and hydration. Pt denies any of these episodes at home. Home Physical Therapy evaluation completed yesterday with planned twice weekly PT visits.  Taking medications as prescribed.   Denies LVAD alarms. Denies VAD driveline trauma.  Denies ICD shocks.  Reports taking Coumadin as prescribed and adherence to anticoagulation based dietary restrictions. Denies bright red blood per rectum or melena, no dark urine or hematuria.   Past Medical History  Diagnosis Date  . Ischemic cardiomyopathy     a. 10/09 Echo: Sev LV Dysfxn, inf/lat AK, Mod MR.  [name];  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 Hypertension   . PVC (premature ventricular contraction)   . Pacemaker   . CHF (congestive heart failure)   . Shortness of breath   . Automatic implantable  cardioverter-defibrillator in situ     Current Outpatient Prescriptions  Medication Sig Dispense Refill  . amiodarone (PACERONE) 200 MG tablet Take 1 tablet (200 mg total) by mouth 2 (two) times daily.  60 tablet  6  . amLODipine (NORVASC) 10 MG tablet Take 1 tablet (10 mg total) by mouth daily.  30 tablet  3  . aspirin 325 MG tablet Take 325 mg by mouth daily.        . Coenzyme Q10 (CO Q-10 PO) Take 1 capsule by mouth daily.       [name] ezetimibe (ZETIA) 10 MG tablet Take 10 mg by mouth daily.      . ferrous Q000111Q C-folic acid (TRINSICON / FOLTRIN) capsule Take 1 capsule by mouth every morning.  30 capsule  3  . levothyroxine (SYNTHROID, LEVOTHROID) 150 MCG tablet 75-150 mcg daily before breakfast. Take 1 tablet (150 mcg) daily on an empty stomach except take 1/2 tablet (75 mcg) every Friday as directed.      . Omega-3 300 MG CAPS Take 300 mg by mouth daily.       . pantoprazole (PROTONIX) 40 MG tablet Take 1 tablet (40 mg total) by mouth daily at 12 noon.  30 tablet  6  . rosuvastatin (CRESTOR) 20 MG tablet Take 20 mg by mouth daily.      [name] spironolactone (ALDACTONE) 25 MG tablet Take 1  tablet (25 mg total) by mouth daily.  30 tablet  3  . warfarin (COUMADIN) 5 MG tablet Take 1 tablet (5 mg total) by mouth daily at 6 PM.  30 tablet  3  . docusate sodium 100 MG CAPS Take 200 mg by mouth daily as needed for mild constipation.  15 capsule  3  . furosemide (LASIX) 20 MG tablet Take 1 tablet (20 mg total) by mouth every other day. Take one time weekly; may take extra dose if weight gain > 3 lbs  30 tablet  6  . losartan (COZAAR) 100 MG tablet Take 1 tablet (100 mg total) by mouth daily.  90 tablet  3  . nitroGLYCERIN (NITROSTAT) 0.4 MG SL tablet Place 0.4 mg under the tongue every 5 (five) minutes as needed. For chest pain      . traMADol (ULTRAM) 50 MG tablet Take 1 tablet (50 mg total) by mouth every 6 (six) hours as needed for moderate pain.  30 tablet  0   No current  facility-administered medications for this encounter.    Diovan and Lipitor  REVIEW OF SYSTEMS: All systems negative except as listed in HPI, PMH and Problem list.   LVAD INTERROGATION:   HeartMate II LVAD:  Flow 3.3  liters/min, speed  9400, power 4.8, PI  5.6   Events: rare PI Alarms: none   I reviewed the LVAD parameters from today, and compared the results to the patient's prior recorded data.  No programming changes were made.  The LVAD is functioning within specified parameters.  The patient performs LVAD self-test daily.  LVAD interrogation was negative for any significant power changes, alarms or PI events/speed drops.  LVAD equipment check completed and is in good working order.  Back-up equipment present.   LVAD education done on emergency procedures and precautions and reviewed exit site care.    Filed Vitals:   10/10/13 1038  BP: 88/0  Pulse: 83  Height: 5\' 9"  (1.753 m)  Weight: 150 lb 12.8 oz (68.402 kg)  SpO2: 99%  Last Weight:  153 lbs (hospital discharge weight)  Physical Exam: GENERAL: Well appearing, male who presents to clinic today in no acute distress. HEENT: normal  NECK: Supple, JVP 7 .  2+ bilaterally, no bruits.  No lymphadenopathy or thyromegaly appreciated.   CARDIAC:  Mechanical heart sounds with LVAD hum present.  LUNGS:  Clear to auscultation bilaterally.  ABDOMEN:  Soft, round, nontender, positive bowel sounds x4.     LVAD exit site: well-healed and incorporated.  Dressing dry and intact.  No erythema or drainage.  Stabilization device present and accurately applied.  Driveline dressing is being changed daily per sterile technique. EXTREMITIES:  Warm and dry, no cyanosis, clubbing, rash or edema  NEUROLOGIC:  Alert and oriented x 4.  Gait steady.  No aphasia.  No dysarthria.  Affect pleasant.      ASSESSMENT AND PLAN:   1. Status post LVAD implantation - Doing very well. Volume status looks good. Had mild orthostasis today but this is isolated  event. Currently has NYHA class II symptoms on LVAD support. MAPs ok. Will take lasix only for weight > 146 pounds. Doing well with equipment and dressing changes.   2.  Anticoagulation management - INR goal 2.0-3.0.  Will evaluate INR value today and follow up with patient for necessary changes.    VAD interrogation looks good.  Daniel Bensimhon,MD 11:28 PM

## 2013-10-16 NOTE — Patient Instructions (Addendum)
1.  Hold coumadin today, then return to 5 mg. HH to check INR next week. 2.  Increase Cozaar to 100 mg daily. 3.  Take Lasix 20 mg once weekly on Monday. 4.  Return to [place] clinic 2 weeks.

## 2013-10-18 ENCOUNTER — Telehealth (HOSPITAL_COMMUNITY): Payer: Self-pay | Admitting: Cardiology

## 2013-10-18 DIAGNOSIS — I4729 Other ventricular tachycardia: Secondary | ICD-10-CM

## 2013-10-18 DIAGNOSIS — Z95811 Presence of heart assist device: Secondary | ICD-10-CM

## 2013-10-18 DIAGNOSIS — I472 Ventricular tachycardia: Secondary | ICD-10-CM

## 2013-10-18 DIAGNOSIS — I5023 Acute on chronic systolic (congestive) heart failure: Secondary | ICD-10-CM

## 2013-10-18 DIAGNOSIS — I2589 Other forms of chronic ischemic heart disease: Secondary | ICD-10-CM

## 2013-10-18 MED ORDER — LOSARTAN POTASSIUM 100 MG PO TABS: 50.0000 mg | ORAL_TABLET | Freq: Every day | ORAL | Status: DC

## 2013-10-18 NOTE — Telephone Encounter (Signed)
Since the recent increase of increase in Losartan to 100mg , pt is having severe light headiness with standing  Please advise

## 2013-10-18 NOTE — Telephone Encounter (Signed)
Discussed w/Amy Ninfa Meeker, NP ok to decrease Losartan back to 50 mg daily, left detailed mess on ID VM

## 2013-10-19 ENCOUNTER — Encounter: Payer: Self-pay | Admitting: Internal Medicine

## 2013-10-19 ENCOUNTER — Other Ambulatory Visit (HOSPITAL_COMMUNITY): Payer: Self-pay | Admitting: *Deleted

## 2013-10-19 DIAGNOSIS — I4729 Other ventricular tachycardia: Secondary | ICD-10-CM

## 2013-10-19 DIAGNOSIS — I2589 Other forms of chronic ischemic heart disease: Secondary | ICD-10-CM

## 2013-10-19 DIAGNOSIS — Z95811 Presence of heart assist device: Secondary | ICD-10-CM

## 2013-10-19 DIAGNOSIS — I472 Ventricular tachycardia: Secondary | ICD-10-CM

## 2013-10-19 DIAGNOSIS — I5023 Acute on chronic systolic (congestive) heart failure: Secondary | ICD-10-CM

## 2013-10-19 MED ORDER — FUROSEMIDE 20 MG PO TABS: 20.0000 mg | ORAL_TABLET | ORAL | Status: DC

## 2013-10-23 ENCOUNTER — Ambulatory Visit: Payer: Self-pay | Admitting: *Deleted

## 2013-10-23 LAB — PROTIME-INR: INR: 6.4 — AB (ref <=1.1)

## 2013-10-25 ENCOUNTER — Ambulatory Visit (HOSPITAL_COMMUNITY): Payer: Self-pay | Admitting: *Deleted

## 2013-10-25 LAB — PROTIME-INR
INR: 3 — AB (ref <=1.1)
INR: 3.1 — AB (ref <=1.1)

## 2013-10-31 ENCOUNTER — Telehealth (HOSPITAL_COMMUNITY): Payer: Self-pay

## 2013-10-31 ENCOUNTER — Encounter (HOSPITAL_COMMUNITY): Payer: Self-pay

## 2013-10-31 NOTE — Telephone Encounter (Signed)
Wife called from dentist office, dentist needed written cardiac clearance for patient to have routine dental cleaning.  Advised with Zada Girt, RN VAD Coordinator and Darrick Grinder NP-C who agreed okay to proceed once confirmed that patient had taken his prophylactic antibiotic.  Formal letter faxed to dental office. Renee Pain

## 2013-11-01 ENCOUNTER — Inpatient Hospital Stay (HOSPITAL_COMMUNITY)
Admission: EM | Admit: 2013-11-01 | Discharge: 2013-11-17 | DRG: 981 | Disposition: A | Discharge status: Rehabilitation Facility | Payer: Medicare Other | Attending: Internal Medicine | Admitting: Internal Medicine

## 2013-11-01 ENCOUNTER — Other Ambulatory Visit: Payer: Self-pay

## 2013-11-01 ENCOUNTER — Inpatient Hospital Stay (HOSPITAL_COMMUNITY): Payer: Medicare Other

## 2013-11-01 ENCOUNTER — Ambulatory Visit (HOSPITAL_COMMUNITY): Payer: Self-pay | Admitting: *Deleted

## 2013-11-01 ENCOUNTER — Ambulatory Visit (HOSPITAL_COMMUNITY)
Admission: RE | Admit: 2013-11-01 | Discharge: 2013-11-01 | Disposition: A | Discharge status: Home / Self Care | Payer: Medicare Other | Source: Ambulatory Visit | Attending: Cardiothoracic Surgery | Admitting: Cardiothoracic Surgery

## 2013-11-01 ENCOUNTER — Encounter: Payer: Self-pay | Admitting: Licensed Clinical Social Worker

## 2013-11-01 ENCOUNTER — Emergency Department (HOSPITAL_COMMUNITY): Payer: Medicare Other

## 2013-11-01 ENCOUNTER — Ambulatory Visit (HOSPITAL_COMMUNITY)
Admission: RE | Admit: 2013-11-01 | Discharge: 2013-11-01 | Disposition: A | Discharge status: Home / Self Care | Payer: Medicare Other | Source: Ambulatory Visit | Attending: Internal Medicine | Admitting: Internal Medicine

## 2013-11-01 ENCOUNTER — Encounter (HOSPITAL_COMMUNITY): Payer: Self-pay | Admitting: Emergency Medicine

## 2013-11-01 ENCOUNTER — Ambulatory Visit (HOSPITAL_BASED_OUTPATIENT_CLINIC_OR_DEPARTMENT_OTHER)
Admission: RE | Admit: 2013-11-01 | Discharge: 2013-11-01 | Disposition: A | Discharge status: Home / Self Care | Payer: Medicare Other | Source: Ambulatory Visit | Attending: Internal Medicine | Admitting: Internal Medicine

## 2013-11-01 ENCOUNTER — Other Ambulatory Visit (HOSPITAL_COMMUNITY): Payer: Self-pay | Admitting: *Deleted

## 2013-11-01 VITALS — BP 94/0 | HR 83 | Ht 69.0 in | Wt 157.6 lb

## 2013-11-01 DIAGNOSIS — E861 Hypovolemia: Secondary | ICD-10-CM | POA: Diagnosis present

## 2013-11-01 DIAGNOSIS — I878 Other specified disorders of veins: Secondary | ICD-10-CM

## 2013-11-01 DIAGNOSIS — I498 Other specified cardiac arrhythmias: Secondary | ICD-10-CM | POA: Insufficient documentation

## 2013-11-01 DIAGNOSIS — R7309 Other abnormal glucose: Secondary | ICD-10-CM | POA: Diagnosis not present

## 2013-11-01 DIAGNOSIS — I5022 Chronic systolic (congestive) heart failure: Secondary | ICD-10-CM

## 2013-11-01 DIAGNOSIS — E872 Acidosis, unspecified: Secondary | ICD-10-CM | POA: Diagnosis not present

## 2013-11-01 DIAGNOSIS — R410 Disorientation, unspecified: Secondary | ICD-10-CM

## 2013-11-01 DIAGNOSIS — K7200 Acute and subacute hepatic failure without coma: Secondary | ICD-10-CM | POA: Diagnosis not present

## 2013-11-01 DIAGNOSIS — R5383 Other fatigue: Secondary | ICD-10-CM | POA: Diagnosis present

## 2013-11-01 DIAGNOSIS — R531 Weakness: Secondary | ICD-10-CM

## 2013-11-01 DIAGNOSIS — F411 Generalized anxiety disorder: Secondary | ICD-10-CM | POA: Diagnosis present

## 2013-11-01 DIAGNOSIS — I5023 Acute on chronic systolic (congestive) heart failure: Secondary | ICD-10-CM

## 2013-11-01 DIAGNOSIS — Z96659 Presence of unspecified artificial knee joint: Secondary | ICD-10-CM

## 2013-11-01 DIAGNOSIS — R509 Fever, unspecified: Secondary | ICD-10-CM

## 2013-11-01 DIAGNOSIS — I509 Heart failure, unspecified: Secondary | ICD-10-CM

## 2013-11-01 DIAGNOSIS — I4729 Other ventricular tachycardia: Secondary | ICD-10-CM

## 2013-11-01 DIAGNOSIS — I252 Old myocardial infarction: Secondary | ICD-10-CM

## 2013-11-01 DIAGNOSIS — I5042 Chronic combined systolic (congestive) and diastolic (congestive) heart failure: Secondary | ICD-10-CM | POA: Diagnosis present

## 2013-11-01 DIAGNOSIS — I2589 Other forms of chronic ischemic heart disease: Secondary | ICD-10-CM | POA: Insufficient documentation

## 2013-11-01 DIAGNOSIS — J9600 Acute respiratory failure, unspecified whether with hypoxia or hypercapnia: Secondary | ICD-10-CM

## 2013-11-01 DIAGNOSIS — D62 Acute posthemorrhagic anemia: Secondary | ICD-10-CM | POA: Diagnosis not present

## 2013-11-01 DIAGNOSIS — J90 Pleural effusion, not elsewhere classified: Secondary | ICD-10-CM | POA: Diagnosis not present

## 2013-11-01 DIAGNOSIS — R55 Syncope and collapse: Secondary | ICD-10-CM

## 2013-11-01 DIAGNOSIS — I4891 Unspecified atrial fibrillation: Secondary | ICD-10-CM | POA: Insufficient documentation

## 2013-11-01 DIAGNOSIS — R578 Other shock: Secondary | ICD-10-CM

## 2013-11-01 DIAGNOSIS — K56609 Unspecified intestinal obstruction, unspecified as to partial versus complete obstruction: Principal | ICD-10-CM

## 2013-11-01 DIAGNOSIS — F29 Unspecified psychosis not due to a substance or known physiological condition: Secondary | ICD-10-CM | POA: Diagnosis present

## 2013-11-01 DIAGNOSIS — Z95811 Presence of heart assist device: Secondary | ICD-10-CM

## 2013-11-01 DIAGNOSIS — R112 Nausea with vomiting, unspecified: Secondary | ICD-10-CM

## 2013-11-01 DIAGNOSIS — I129 Hypertensive chronic kidney disease with stage 1 through stage 4 chronic kidney disease, or unspecified chronic kidney disease: Secondary | ICD-10-CM | POA: Diagnosis present

## 2013-11-01 DIAGNOSIS — I951 Orthostatic hypotension: Secondary | ICD-10-CM

## 2013-11-01 DIAGNOSIS — R5381 Other malaise: Secondary | ICD-10-CM | POA: Diagnosis present

## 2013-11-01 DIAGNOSIS — N183 Chronic kidney disease, stage 3 unspecified: Secondary | ICD-10-CM | POA: Diagnosis present

## 2013-11-01 DIAGNOSIS — R109 Unspecified abdominal pain: Secondary | ICD-10-CM

## 2013-11-01 DIAGNOSIS — Z9911 Dependence on respirator [ventilator] status: Secondary | ICD-10-CM

## 2013-11-01 DIAGNOSIS — F3289 Other specified depressive episodes: Secondary | ICD-10-CM | POA: Diagnosis present

## 2013-11-01 DIAGNOSIS — N179 Acute kidney failure, unspecified: Secondary | ICD-10-CM

## 2013-11-01 DIAGNOSIS — I469 Cardiac arrest, cause unspecified: Secondary | ICD-10-CM

## 2013-11-01 DIAGNOSIS — Z9861 Coronary angioplasty status: Secondary | ICD-10-CM

## 2013-11-01 DIAGNOSIS — Z7901 Long term (current) use of anticoagulants: Secondary | ICD-10-CM

## 2013-11-01 DIAGNOSIS — Z7682 Awaiting organ transplant status: Secondary | ICD-10-CM

## 2013-11-01 DIAGNOSIS — Z79899 Other long term (current) drug therapy: Secondary | ICD-10-CM

## 2013-11-01 DIAGNOSIS — J029 Acute pharyngitis, unspecified: Secondary | ICD-10-CM | POA: Diagnosis not present

## 2013-11-01 DIAGNOSIS — K929 Disease of digestive system, unspecified: Secondary | ICD-10-CM | POA: Diagnosis not present

## 2013-11-01 DIAGNOSIS — E785 Hyperlipidemia, unspecified: Secondary | ICD-10-CM | POA: Diagnosis present

## 2013-11-01 DIAGNOSIS — N189 Chronic kidney disease, unspecified: Secondary | ICD-10-CM

## 2013-11-01 DIAGNOSIS — Z823 Family history of stroke: Secondary | ICD-10-CM

## 2013-11-01 DIAGNOSIS — E079 Disorder of thyroid, unspecified: Secondary | ICD-10-CM

## 2013-11-01 DIAGNOSIS — I4510 Unspecified right bundle-branch block: Secondary | ICD-10-CM | POA: Diagnosis present

## 2013-11-01 DIAGNOSIS — J942 Hemothorax: Secondary | ICD-10-CM

## 2013-11-01 DIAGNOSIS — F329 Major depressive disorder, single episode, unspecified: Secondary | ICD-10-CM | POA: Diagnosis present

## 2013-11-01 DIAGNOSIS — Z8249 Family history of ischemic heart disease and other diseases of the circulatory system: Secondary | ICD-10-CM

## 2013-11-01 DIAGNOSIS — I10 Essential (primary) hypertension: Secondary | ICD-10-CM | POA: Diagnosis present

## 2013-11-01 DIAGNOSIS — I2510 Atherosclerotic heart disease of native coronary artery without angina pectoris: Secondary | ICD-10-CM | POA: Diagnosis present

## 2013-11-01 DIAGNOSIS — E7800 Pure hypercholesterolemia, unspecified: Secondary | ICD-10-CM | POA: Diagnosis present

## 2013-11-01 DIAGNOSIS — K560 Paralytic ileus: Secondary | ICD-10-CM | POA: Diagnosis present

## 2013-11-01 DIAGNOSIS — E039 Hypothyroidism, unspecified: Secondary | ICD-10-CM | POA: Diagnosis present

## 2013-11-01 DIAGNOSIS — E876 Hypokalemia: Secondary | ICD-10-CM | POA: Diagnosis not present

## 2013-11-01 DIAGNOSIS — M1990 Unspecified osteoarthritis, unspecified site: Secondary | ICD-10-CM | POA: Diagnosis present

## 2013-11-01 DIAGNOSIS — Z95810 Presence of automatic (implantable) cardiac defibrillator: Secondary | ICD-10-CM

## 2013-11-01 DIAGNOSIS — R570 Cardiogenic shock: Secondary | ICD-10-CM

## 2013-11-01 DIAGNOSIS — I472 Ventricular tachycardia: Secondary | ICD-10-CM

## 2013-11-01 DIAGNOSIS — R1110 Vomiting, unspecified: Secondary | ICD-10-CM

## 2013-11-01 DIAGNOSIS — E43 Unspecified severe protein-calorie malnutrition: Secondary | ICD-10-CM | POA: Diagnosis not present

## 2013-11-01 LAB — COMPREHENSIVE METABOLIC PANEL
ALT: 27 U/L (ref 0–53)
ALT: 28 U/L (ref 0–53)
AST: 36 U/L (ref 0–37)
AST: 36 U/L (ref 0–37)
Albumin: 3.1 g/dL — ABNORMAL LOW (ref 3.5–5.2)
Albumin: 3.3 g/dL — ABNORMAL LOW (ref 3.5–5.2)
Alkaline Phosphatase: 134 U/L — ABNORMAL HIGH (ref 39–117)
Alkaline Phosphatase: 134 U/L — ABNORMAL HIGH (ref 39–117)
BUN: 16 mg/dL (ref 6–23)
BUN: 17 mg/dL (ref 6–23)
CO2: 24 mEq/L (ref 19–32)
CO2: 23 mEq/L (ref 19–32)
Calcium: 8.6 mg/dL (ref 8.4–10.5)
Calcium: 8.8 mg/dL (ref 8.4–10.5)
Chloride: 102 mEq/L (ref 96–112)
Chloride: 102 mEq/L (ref 96–112)
Creatinine, Ser: 1.37 mg/dL — ABNORMAL HIGH (ref 0.50–1.35)
Creatinine, Ser: 1.37 mg/dL — ABNORMAL HIGH (ref 0.50–1.35)
GFR calc Af Amer: 59 mL/min — ABNORMAL LOW (ref >90)
GFR calc Af Amer: 59 mL/min — ABNORMAL LOW (ref >90)
GFR calc non Af Amer: 51 mL/min — ABNORMAL LOW (ref >90)
GFR calc non Af Amer: 51 mL/min — ABNORMAL LOW (ref >90)
Glucose, Bld: 99 mg/dL (ref 70–99)
Glucose, Bld: 116 mg/dL — ABNORMAL HIGH (ref 70–99)
Potassium: 4.4 mEq/L (ref 3.7–5.3)
Potassium: 4.3 mEq/L (ref 3.7–5.3)
Sodium: 139 mEq/L (ref 137–147)
Sodium: 139 mEq/L (ref 137–147)
Total Bilirubin: 0.5 mg/dL (ref 0.3–1.2)
Total Bilirubin: 0.6 mg/dL (ref 0.3–1.2)
Total Protein: 7.7 g/dL (ref 6.0–8.3)
Total Protein: 7.6 g/dL (ref 6.0–8.3)

## 2013-11-01 LAB — URINALYSIS, ROUTINE W REFLEX MICROSCOPIC
Glucose, UA: NEGATIVE mg/dL
Hgb urine dipstick: NEGATIVE
Ketones, ur: NEGATIVE mg/dL
Leukocytes, UA: NEGATIVE
Nitrite: NEGATIVE
Protein, ur: NEGATIVE mg/dL
Specific Gravity, Urine: 1.02 (ref 1.005–1.030)
Urobilinogen, UA: 0.2 mg/dL (ref 0.0–1.0)
pH: 6 (ref 5.0–8.0)

## 2013-11-01 LAB — PROTIME-INR
INR: 3.74 — ABNORMAL HIGH (ref 0.00–1.49)
INR: 3.56 — ABNORMAL HIGH (ref 0.00–1.49)
Prothrombin Time: 35.6 seconds — ABNORMAL HIGH (ref 11.6–15.2)
Prothrombin Time: 34.3 seconds — ABNORMAL HIGH (ref 11.6–15.2)

## 2013-11-01 LAB — CBC WITH DIFFERENTIAL/PLATELET
Basophils Absolute: 0 10*3/uL (ref 0.0–0.1)
Basophils Relative: 0 % (ref 0–1)
Eosinophils Absolute: 0.2 10*3/uL (ref 0.0–0.7)
Eosinophils Relative: 1 % (ref 0–5)
HCT: 38.7 % — ABNORMAL LOW (ref 39.0–52.0)
Hemoglobin: 12.5 g/dL — ABNORMAL LOW (ref 13.0–17.0)
Lymphocytes Relative: 9 % — ABNORMAL LOW (ref 12–46)
Lymphs Abs: 1 10*3/uL (ref 0.7–4.0)
MCH: 27.7 pg (ref 26.0–34.0)
MCHC: 32.3 g/dL (ref 30.0–36.0)
MCV: 85.6 fL (ref 78.0–100.0)
Monocytes Absolute: 0.7 10*3/uL (ref 0.1–1.0)
Monocytes Relative: 7 % (ref 3–12)
Neutro Abs: 8.8 10*3/uL — ABNORMAL HIGH (ref 1.7–7.7)
Neutrophils Relative %: 83 % — ABNORMAL HIGH (ref 43–77)
Platelets: 234 10*3/uL (ref 150–400)
RBC: 4.52 MIL/uL (ref 4.22–5.81)
RDW: 20 % — ABNORMAL HIGH (ref 11.5–15.5)
WBC: 10.7 10*3/uL — ABNORMAL HIGH (ref 4.0–10.5)

## 2013-11-01 LAB — LACTATE DEHYDROGENASE: LDH: 350 U/L — ABNORMAL HIGH (ref 94–250)

## 2013-11-01 LAB — PRO B NATRIURETIC PEPTIDE: Pro B Natriuretic peptide (BNP): 877.6 pg/mL — ABNORMAL HIGH (ref 0–125)

## 2013-11-01 LAB — CBC
HCT: 38.4 % — ABNORMAL LOW (ref 39.0–52.0)
Hemoglobin: 12.3 g/dL — ABNORMAL LOW (ref 13.0–17.0)
MCH: 27.3 pg (ref 26.0–34.0)
MCHC: 32 g/dL (ref 30.0–36.0)
MCV: 85.3 fL (ref 78.0–100.0)
Platelets: 251 10*3/uL (ref 150–400)
RBC: 4.5 MIL/uL (ref 4.22–5.81)
RDW: 19.7 % — ABNORMAL HIGH (ref 11.5–15.5)
WBC: 7.3 10*3/uL (ref 4.0–10.5)

## 2013-11-01 LAB — T4: T4, Total: 10.6 ug/dL (ref 5.0–12.5)

## 2013-11-01 LAB — T3: T3, Total: 37.8 ng/dl — ABNORMAL LOW (ref 80.0–204.0)

## 2013-11-01 LAB — TROPONIN I: Troponin I: <0.3 ng/mL (ref <0.30)

## 2013-11-01 LAB — I-STAT CG4 LACTIC ACID, ED: Lactic Acid, Venous: 1.31 mmol/L (ref 0.5–2.2)

## 2013-11-01 LAB — LIPASE, BLOOD: Lipase: 39 U/L (ref 11–59)

## 2013-11-01 LAB — TSH: TSH: 26.457 u[IU]/mL — ABNORMAL HIGH (ref 0.350–4.500)

## 2013-11-01 MED ORDER — NON FORMULARY: 20.0000 mg | Freq: Every day | Status: DC

## 2013-11-01 MED ORDER — DOCUSATE SODIUM 100 MG PO CAPS: 200.0000 mg | ORAL_CAPSULE | Freq: Every day | ORAL | Status: DC | PRN

## 2013-11-01 MED ORDER — CEPHALEXIN 500 MG PO CAPS: 500.0000 mg | ORAL_CAPSULE | Freq: Two times a day (BID) | ORAL | Status: DC

## 2013-11-01 MED ORDER — IOHEXOL 300 MG/ML  SOLN
25.0000 mL | Freq: Once | INTRAMUSCULAR | Status: AC | PRN
  Administered 2013-11-01: 25 mL via ORAL

## 2013-11-01 MED ORDER — AMIODARONE HCL 200 MG PO TABS
200.0000 mg | ORAL_TABLET | Freq: Two times a day (BID) | ORAL | Status: DC
  Filled 2013-11-01 (×3): qty 1

## 2013-11-01 MED ORDER — PROMETHAZINE HCL 25 MG/ML IJ SOLN
12.5000 mg | Freq: Once | INTRAMUSCULAR | Status: AC
  Administered 2013-11-01: 12.5 mg via INTRAVENOUS
  Filled 2013-11-01: qty 1

## 2013-11-01 MED ORDER — CEPHALEXIN 500 MG PO CAPS
500.0000 mg | ORAL_CAPSULE | Freq: Two times a day (BID) | ORAL | Status: DC
  Filled 2013-11-01: qty 1

## 2013-11-01 MED ORDER — WARFARIN - PHARMACIST DOSING INPATIENT: Freq: Every day | Status: DC

## 2013-11-01 MED ORDER — TRAMADOL HCL 50 MG PO TABS: 50.0000 mg | ORAL_TABLET | Freq: Four times a day (QID) | ORAL | Status: DC | PRN

## 2013-11-01 MED ORDER — PANTOPRAZOLE SODIUM 40 MG PO TBEC: 40.0000 mg | DELAYED_RELEASE_TABLET | Freq: Every day | ORAL | Status: DC

## 2013-11-01 MED ORDER — EZETIMIBE 10 MG PO TABS
10.0000 mg | ORAL_TABLET | Freq: Every day | ORAL | Status: DC
  Filled 2013-11-01: qty 1

## 2013-11-01 MED ORDER — ROSUVASTATIN CALCIUM 20 MG PO TABS
20.0000 mg | ORAL_TABLET | Freq: Every day | ORAL | Status: DC
  Filled 2013-11-01 (×2): qty 1

## 2013-11-01 MED ORDER — LEVOTHYROXINE SODIUM 75 MCG PO TABS
75.0000 ug | ORAL_TABLET | ORAL | Status: DC
  Filled 2013-11-01: qty 1

## 2013-11-01 MED ORDER — ASPIRIN 325 MG PO TABS
325.0000 mg | ORAL_TABLET | Freq: Every day | ORAL | Status: DC
  Filled 2013-11-01 (×2): qty 1

## 2013-11-01 MED ORDER — LEVOTHYROXINE SODIUM 75 MCG PO TABS
150.0000 ug | ORAL_TABLET | ORAL | Status: DC
  Filled 2013-11-01: qty 2

## 2013-11-01 MED ORDER — FUROSEMIDE 20 MG PO TABS: 20.0000 mg | ORAL_TABLET | ORAL | Status: DC | PRN

## 2013-11-01 MED ORDER — SODIUM CHLORIDE 0.9 % IV SOLN
INTRAVENOUS | Status: DC
  Administered 2013-11-01: 23:00:00 via INTRAVENOUS

## 2013-11-01 MED ORDER — IOHEXOL 300 MG/ML  SOLN
100.0000 mL | Freq: Once | INTRAMUSCULAR | Status: AC | PRN
  Administered 2013-11-02: 100 mL via INTRAVENOUS

## 2013-11-01 NOTE — Progress Notes (Signed)
Symptom  Yes  No  Details   Angina        x  Ativity:   Claudication        x How far:   Syncope              x When:  dizzy and lightheaded with with orthostatic change; noted increased frequency Tuesday following PO lasix Monday  Stroke        x   Orthopnea        x How many pillows:   one  PND        x How often:  CPAP    N/A  How many hrs:   Pedal edema             x     Abd fullness             x   N&V        x  good appetite  Diaphoresis        x When:  Bleeding           x     Urine color        light yellow  SOB            x Activity:     Palpitations        x When:  ICD shock        x   Hospitlizaitons              x When/where/why:    ED visit        x When/where/why:  Other MD        x When/who/why:  Activity     Home PT twice weekly  Fluid     No limitations < 2000  Diet     Low salt   Vital signs: HR: 73 MAP BP:  94 O2 Sat:  97 Wt:    157.6 lbs Last wt: 157.4 lbs Ht: 5'9"  LVAD interrogation reveals:  Speed:  9400 Flow:  3.6 Power:  5.0 PI: 4.0 Alarms:  none Events:  Rare PI Fixed speed:  9400 Low speed limit:  8800  LVAD exit site:  Partially healed and incorporated. The velour is fully implanted at exit site. Gauze dressing with aquacel strip dry and intact. Dressing change using sterile technique, remains erythematous under aquacel strip, no drainage or foul odor noted; pt denies tenderness.  Stabilization device present and accurately applied. Driveline dressing is being changed daily per sterile technique using guaze dressing with aquacel strip on exit site. Pt denies fever or chills. Pt/caregiver state they have adequate dressing supplies at home.  Dr. Prescott Gum inspected exit site, will increase dressing changes to every other day.  Incision near umbilicus healing, but wife noted one small raised, reddened area with small amount "pus" draining from site; Dr. Darcey [name] ordered Keflex 500 mg bid x 5 days.   Pt experienced syncopal episode today when  called from waiting room; pt says he "stood up too quickly" and felt himself pass out.   VAD parameters and VS immediately post episode: Speed:  9400 Flow:  3.6 Power:  5.0 PI: 4.0 Alarms:  none Events:  Rare PI HR: 83 MAP BP:  94 O2 Sat:  97  Amy Clegg, NP and Dr. Haroldine Laws at bedside to evaluate patient. After EKG, ICD device interrogation, food, and PO fluids, pt sat up on side of bed and still c/o of feeling "slightly lightheaded".  VAD parameters and VS: Speed:  9400 Flow:  3.5 Power:  4.9 PI: 3.8 Alarms:  none Events:  Rare PI HR: 73 MAP BP:  64 O2 Sat:  97  Dr. Haroldine Laws updated and pt received 500 cc NS bolus; following bolus and asking pt to stand:  VAD parameters and VS: Speed:  9400 Flow:  3.8 Power:  5.1 PI: 2.5 Alarms:  none Events:  Rare PI MAP BP:  64, 68  Dr. Haroldine Laws updated; second 500 cc bolus IVF given and bedside echo reviewed by Dr. Haroldine Laws; following completion of these, asked pt to stand and obtained the following:  VAD parameters and VS: Speed:  9400 Flow:  4.4 Power:  5.4 PI: 6.0 Alarms:  none Events:  Rare PI MAP BP:  100  Overall patient feels better, still having some PI drops with standing; advised wife/pt to monitor how he feels. If he continues with dizziness or lightheadedness, he is to call VAD pager. Wife verbalized understanding of same.    I reviewed the LVAD parameters from today, and compared the results to the patient's prior recorded data. No programming changes were made. The LVAD is functioning within specified parameters.  LVAD interrogation was negative for any significant power changes or alarms; a few PI events/speed drops present which is consistent with the patient's history.    Pt/caregiver deny any alarms or VAD equipment issues. VAD coordinator reviewed daily log from home for daily temperature, weight, and VAD parameters. Pt is performing daily controller and system monitor self tests along with completing  weekly and monthly maintenance for LVAD equipment.   LVAD equipment check completed and is in good working order. Back-up equipment present. LVAD education done on emergency procedures and precautions and reviewed exit site care.

## 2013-11-01 NOTE — H&P (Signed)
Advanced Heart Failure Team History and Physical Note     Reason for Admission: Vomiting, weakness, hypotension   HPI:    [name] is a 73 year old patient with a history of CAD s/p stent to LCX 2004 with re-occlusion of LCX with lateral MI 2009 and repeat stenting, total occlusion of RCA, ICM, chronic systolic heart failure, hypothyroidism, HLD and arthritis. S/P LVAD HMII 09/19/2013 currently [facility] list .   Admitted to [facility] 09/13/13 through 10/03/13. Initially admitted with decompenstaed heart failure and low output. Required IABP for stabilization. He ultimately had LVAD HM II placed on 09/19/13. He suffered from RV failure after the procedure and required milrinone for >7 days which was gradually weaned to off. During his stay he did develop acute ICU psychosis with delirium which was treated with Zyprexa. Once he was transferred out of the ICU this improved and he returned back to baseline. He was not discharged on beta blocker. D/C weight 151 pounds.   Was seen in HF Clinic today for routine f/u and had syncopal episode on standing. Found to be orthostatic. Given 1L NS. Labs and echo done which looked fine. Was d/c'd from Clinic and his wife stopped off at [place] on way home. When she returned to car she found that he had vomited and was lethargic. Brought to ER. In ER, looked better. MAP 104.   Denies SOB/PND/Orthopnea/CP or ICD shock. No fevers, chills, diarrhea.   Denies LVAD alarms. Denies driveline trauma, erythema or drainage. Denies ICD shocks.   LVAD INTERROGATION:  HeartMate II LVAD: Flow 4.1 liters/min, speed 9400, power 5.2, PI 5.52. Rare PI Fixed Speed 9400 Back UP Speed 8800. Rare PI events   I reviewed the LVAD parameters from today, and compared the results to the patient's prior recorded data. No programming changes were made. The LVAD is functioning within specified parameters. The patient performs LVAD self-test daily. LVAD interrogation was negative for any  significant power changes, alarms or PI events/speed drops. LVAD equipment check completed and is in good working order. Back-up equipment present. LVAD education done on emergency procedures and precautions and reviewed exit site care.    Review of Systems: [y] = yes, [ ]  = no   General: Weight gain [ ] ; Weight loss [ ] ; Anorexia Blue.Reese ]; Fatigue [ ] ; Fever [ ] ; Chills [ ] ; Weakness Blue.Reese ]  Cardiac: Chest pain/pressure [ ] ; Resting SOB [ ] ; Exertional SOB [ ] ; Orthopnea [ ] ; Pedal Edema [ ] ; Palpitations [ ] ; Syncope [ ] ; Presyncope [ ] ; Paroxysmal nocturnal dyspnea[ ]   Pulmonary: Cough [ ] ; Wheezing[ ] ; Hemoptysis[ ] ; Sputum [ ] ; Snoring [ ]   GI: Vomiting[ ] ; Dysphagia[ ] ; Melena[ ] ; Hematochezia [ ] ; Heartburn[ ] ; Abdominal pain [ ] ; Constipation [ ] ; Diarrhea [ ] ; BRBPR [ ]   GU: Hematuria[ ] ; Dysuria [ ] ; Nocturia[ ]   Vascular: Pain in legs with walking [ ] ; Pain in feet with lying flat [ ] ; Non-healing sores [ ] ; Stroke [ ] ; TIA [ ] ; Slurred speech [ ] ;  Neuro: Headaches[ ] ; Vertigo[ ] ; Seizures[ ] ; Paresthesias[ ] ;Blurred vision [ ] ; Diplopia [ ] ; Vision changes [ ]   Ortho/Skin: Arthritis [ ] ; Joint pain [ ] ; Muscle pain [ ] ; Joint swelling [ ] ; Back Pain [ ] ; Rash [ ]   Psych: Depression[ ] ; Anxiety[ ]   Heme: Bleeding problems [ ] ; Clotting disorders [ ] ; Anemia [ ]   Endocrine: Diabetes [ ] ; Thyroid dysfunction[y ]  Home Medications Prior to Admission medications   Medication  Sig Start Date End Date Taking? Authorizing Provider  amiodarone (PACERONE) 200 MG tablet Take 1 tablet (200 mg total) by mouth 2 (two) times daily. 09/08/13   Jolaine Artist, MD  amLODipine (NORVASC) 10 MG tablet Take 1 tablet (10 mg total) by mouth daily. 10/03/13   Rande Brunt, NP  aspirin 325 MG tablet Take 325 mg by mouth daily.      Historical Provider, MD  cephALEXin (KEFLEX) 500 MG capsule Take 1 capsule (500 mg total) by mouth 2 (two) times daily. 11/01/13   Ivin Poot, MD  Coenzyme Q10 (CO Q-10 PO) Take  1 capsule by mouth daily.     Historical Provider, MD  docusate sodium 100 MG CAPS Take 200 mg by mouth daily as needed for mild constipation. 10/03/13   Rande Brunt, NP  ezetimibe (ZETIA) 10 MG tablet Take 10 mg by mouth daily.    Historical Provider, MD  ferrous Q000111Q C-folic acid (TRINSICON / FOLTRIN) capsule Take 1 capsule by mouth every morning. 10/03/13   Rande Brunt, NP  furosemide (LASIX) 20 MG tablet Take 1 tablet (20 mg total) by mouth as needed. Every Monday, may take extra dose if weight gain > 3 lbs 11/01/13   Amy D Clegg, NP  levothyroxine (SYNTHROID, LEVOTHROID) 150 MCG tablet 75-150 mcg daily before breakfast. Take 1 tablet (150 mcg) daily on an empty stomach except take 1/2 tablet (75 mcg) every Friday as directed. 03/16/13   Elayne Snare, MD  nitroGLYCERIN (NITROSTAT) 0.4 MG SL tablet Place 0.4 mg under the tongue every 5 (five) minutes as needed. For chest pain    Historical Provider, MD  Omega-3 300 MG CAPS Take 300 mg by mouth daily.     Historical Provider, MD  pantoprazole (PROTONIX) 40 MG tablet Take 1 tablet (40 mg total) by mouth daily at 12 noon. 10/03/13   Rande Brunt, NP  rosuvastatin (CRESTOR) 20 MG tablet Take 20 mg by mouth daily.    Historical Provider, MD  traMADol (ULTRAM) 50 MG tablet Take 1 tablet (50 mg total) by mouth every 6 (six) hours as needed for moderate pain. 10/03/13   Rande Brunt, NP  warfarin (COUMADIN) 5 MG tablet Take 1 tablet (5 mg total) by mouth daily at 6 PM. 10/03/13   Rande Brunt, NP    Past Medical History: Past Medical History  Diagnosis Date  . Ischemic cardiomyopathy     a. 10/09 Echo: Sev LV Dysfxn, inf/lat AK, Mod MR.  [name];  s/p Lat MI 2009 with occlusion of the LCX - treated with Promus stenting. Has total occlusion of the RCA.   . ICD (implantable cardiac  defibrillator) in place     PROPHYLACTIC      medtronic  . RBBB   . 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 Hypertension   . PVC (premature ventricular contraction)   . Pacemaker   . CHF (congestive heart failure)   . Shortness of breath   . Automatic implantable cardioverter-defibrillator in situ     Past Surgical History: Past Surgical History  Procedure Laterality Date  . Defibrillator  2009  . Total knee arthroplasty  11/27/11    left  . Insert / replace / remove pacemaker  2009  .  Coronary angioplasty with stent placement  2004  . Coronary angioplasty with stent placement  07/2008  . Knee arthroscopy w/ partial medial meniscectomy  12/2002    left  . Knee arthroscopy      bilaterally  . Total knee arthroplasty  11/27/2011    Procedure: TOTAL KNEE ARTHROPLASTY;  Surgeon: Yvette Rack., MD;  Location: [place];  Service: Orthopedics;  Laterality: Left;  left total knee arthroplasty  . Esophagogastroduodenoscopy N/A 09/15/2013    Procedure: ESOPHAGOGASTRODUODENOSCOPY (EGD);  Surgeon: Ladene Artist, MD;  Location: [facility] ENDOSCOPY;  Service: Endoscopy;  Laterality: N/A;  bedside  . Insertion of implantable left ventricular assist device N/A 09/18/2013    Procedure: INSERTION OF IMPLANTABLE LEFT VENTRICULAR ASSIST DEVICE;  Surgeon: Ivin Poot, MD;  Location: [place];  Service: Open Heart Surgery;  Laterality: N/A;  CIRC ARREST  NITRIC OXIDE  . Intraoperative transesophageal echocardiogram N/A 09/18/2013    Procedure: INTRAOPERATIVE TRANSESOPHAGEAL ECHOCARDIOGRAM;  Surgeon: Ivin Poot, MD;  Location: [place];  Service: Open Heart Surgery;  Laterality: N/A;    Family History: Family History  Problem Relation Age of Onset  . Heart failure Father   . Stroke Father   . Coronary artery disease Mother   . Heart disease Mother   . Hyperlipidemia Sister     Social History: History   Social History  . Marital Status: Married    Spouse Name: N/A    Number of Children: 1  . Years of  Education: N/A   Occupational History  . referee   . cleaning company    Social History Main Topics  . Smoking status: Never Smoker   . Smokeless tobacco: Never Used  . Alcohol Use: Yes     Comment: "occassionlly; last beer was 09/2011"  . Drug Use: No     Comment: "back in our younger days we smoked a little pot"  . Sexual Activity: Not Currently   Other Topics Concern  . None   Social History Narrative  . None    Allergies:  Allergies  Allergen Reactions  . Diovan [Valsartan] Other (See Comments)    Hypotension at low dose, hyperkalemia  . Lipitor [Atorvastatin Calcium] Other (See Comments)    Muscle pain    Objective:    Vital Signs:   Temp:  [98.2 F (36.8 C)] 98.2 F (36.8 C) (03/04 1807) Pulse Rate:  [83-87] 87 (03/04 1807) Resp:  [21] 21 (03/04 1807) BP: (94)/(0) 94/0 mmHg (03/04 1109) SpO2:  [97 %] 97 % (03/04 1807) Weight:  [71.487 kg (157 lb 9.6 oz)] 71.487 kg (157 lb 9.6 oz) (03/04 1109)   There were no vitals filed for this visit.  Physical Exam: GENERAL: Weak appearing. NAD HEENT: normal  NECK: Supple, JVP flat . 2+ bilaterally, no bruits. No lymphadenopathy or thyromegaly appreciated.  CARDIAC: Mechanical heart sounds with LVAD hum present.  LUNGS: Clear to auscultation bilaterally.  ABDOMEN: Soft, round, nontender, positive bowel sounds x4.  LVAD exit site: well-healed and incorporated. Dressing dry and intact. No erythema or drainage. Stabilization device present and accurately applied. Driveline dressing is being changed daily per sterile technique.  EXTREMITIES: Warm and dry, no cyanosis, clubbing, rash or edema  NEUROLOGIC: Alert and oriented x 4. Gait steady. No aphasia. No dysarthria. Affect pleasant.    Telemetry: SR   Labs: Basic Metabolic Panel:  Recent Labs Lab 11/01/13 1033  NA 139  K 4.4  CL 102  CO2 24  GLUCOSE 99  BUN  16  CREATININE 1.37*  CALCIUM 8.6    Liver Function Tests:  Recent Labs Lab 11/01/13 1033   AST 36  ALT 27  ALKPHOS 134*  BILITOT 0.5  PROT 7.7  ALBUMIN 3.1*   No results found for this basename: LIPASE, AMYLASE,  in the last 168 hours No results found for this basename: AMMONIA,  in the last 168 hours  CBC:  Recent Labs Lab 11/01/13 1033 11/01/13 1812  WBC 7.3 10.7*  NEUTROABS  --  8.8*  HGB 12.3* 12.5*  HCT 38.4* 38.7*  MCV 85.3 85.6  PLT 251 234    Cardiac Enzymes: No results found for this basename: CKTOTAL, CKMB, CKMBINDEX, TROPONINI,  in the last 168 hours  BNP: BNP (last 3 results)  Recent Labs  09/13/13 1349 09/25/13 0400 11/01/13 1033  PROBNP 3883.0* 7019.0* 877.6*    CBG: No results found for this basename: GLUCAP,  in the last 168 hours  Coagulation Studies:  Recent Labs  11/01/13 1033  LABPROT 35.6*  INR 3.74*    Other results:   Imaging: Dg Chest 2 View  11/01/2013   CLINICAL DATA:  Fall.  LVAD  EXAM: CHEST  2 VIEW  COMPARISON:  09/28/2013  FINDINGS: There is a left chest wall ICD with lead in the right ventricle and right atrial appendage. The left ventricular assist device is identified and appears unchanged in position from previous exam. Stable appearance of left upper lobe perihilar opacity and pleural thickening of the major fissure. No displaced rib fractures noted.  IMPRESSION: 1. No acute findings and no significant change from 10/02/2013   Electronically Signed   By: Kerby Moors M.D.   On: 11/01/2013 16:34         Assessment:   1. Vomiting/weakness 2. Orthostatic hypotension 3. Chronic systolic HF s/p HM II VAD 4. CAD 5. Hypothyroidism  Plan/Discussion:    I am unsure why [name] is a having N/V and weakness. In clinic, volume status was low but now seems replete. Will admit for further work-up. Will interrogate ICD. Check KUB. Watch for arrhythmias on tele. Check UA. Further testing based on initial results.  Length of Stay: 0   Glori Bickers MD 11/01/2013, 6:50 PM  Advanced Heart Failure  Team Pager [phone] (M-F; 7a - 4p)  Please contact [facility] for night-coverage after hours (4p -7a ) and weekends on [email or web address]

## 2013-11-01 NOTE — Progress Notes (Signed)
CSW met with patient and wife in the clinic today. Patient received IV fluids during visit due to dehydration and reported feeling much better after the fluids. Patient denied any concerns and states he is doing well at home. He did admit that he often "jumps up too quickly and gets light headed". He verbalized that he has some lifestyle changes to get use to with the VAD. "I was always on the go and moved quickly and I can't do that anymore". CSW provided support and encouragement and will continue to follow throughout recovery. Raquel Sarna, [place]

## 2013-11-01 NOTE — Progress Notes (Signed)
After 500cc 0.9% NS bolus, patient still showing s/s of orthostatic hypotension, per Dr. Haroldine Laws 2nd 500cc 0.9% NS bolus administered.  Echo ordered.  Patient tolerated well.  After 1000cc total bolused, patient's MAP 100, PI 6.8, fluctuating by .5-1 with sitting/standing, but quickly going back to baseline number.  Patient feels "weak and wore out", but not as lightheaded.  Escorted patient and wife via wheelchair to radiology for PA/LAT CXR.

## 2013-11-01 NOTE — ED Notes (Signed)
VAD coordinator, Cloyde Reams at bedside,

## 2013-11-01 NOTE — Progress Notes (Signed)
ANTICOAGULATION CONSULT NOTE - Initial Consult  Pharmacy Consult for warfarin Indication: LVAD  Allergies  Allergen Reactions  . Diovan [Valsartan] Other (See Comments)    Hypotension at low dose, hyperkalemia  . Lipitor [Atorvastatin Calcium] Other (See Comments)    Muscle pain    Patient Measurements:     Vital Signs: Temp: 98.2 F (36.8 C) (03/04 1807) BP: 94/0 mmHg (03/04 1109) Pulse Rate: 88 (03/04 2027)  Labs:  Recent Labs  11/01/13 1033 11/01/13 1812  HGB 12.3* 12.5*  HCT 38.4* 38.7*  PLT 251 234  LABPROT 35.6* 34.3*  INR 3.74* 3.56*  CREATININE 1.37* 1.37*  TROPONINI  --  <0.30    The CrCl is unknown because both a height and weight (above a minimum accepted value) are required for this calculation.   Medical History: Past Medical History  Diagnosis Date  . Ischemic cardiomyopathy     a. 10/09 Echo: Sev LV Dysfxn, inf/lat AK, Mod MR.  [name];  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 Hypertension   . PVC (premature ventricular contraction)   . Pacemaker   . CHF (congestive heart failure)   . Shortness of breath   . Automatic implantable cardioverter-defibrillator in situ     Medications:  Scheduled:  . amiodarone  200 mg Oral BID  . aspirin  325 mg Oral Daily  . cephALEXin  500 mg Oral BID  . [START ON 11/02/2013] ezetimibe  10 mg Oral Daily  . [START ON 11/02/2013] levothyroxine  150 mcg Oral Once per day on Sun Mon Tue Wed Thu Sat  . [START ON 11/03/2013] levothyroxine  75 mcg Oral Once per day on Fri  . [START ON 11/02/2013] pantoprazole  40 mg Oral Q1200  . rosuvastatin  20 mg Oral q1800     Assessment: 83 YOM with LVAD 09/19/2013 seen in HF clinic and had syncopal episode- given fluids and labs and ECHO were ok. On the way home, patient vomited and was lethargic and was brought back to the ED for admission. On warfarin PTA- 5mg  daily except 2.5mg  on Tuesdays, Thursdays and Saturdays. INR on admission elevated at 3.56. Hgb 12.5, platelets 234. No overt bleeding noted.  Goal of Therapy:  INR 2-3  Monitor platelets by anticoagulation protocol: Yes   Plan:  1. Hold warfarin tonight 2. Daily PT/INR; CBC tomorrow 3. Follow for s/s bleeding  Johnny Oneill, PharmD, BCPS Clinical Pharmacist Pager: [phone] 11/01/2013 10:09 PM

## 2013-11-01 NOTE — ED Provider Notes (Signed)
CSN: [id number]     Arrival date & time 11/01/13  1756 History   First MD Initiated Contact with Patient 11/01/13 1803     Chief Complaint  Patient presents with  . Emesis     (Consider location/radiation/quality/duration/timing/severity/associated sxs/prior Treatment) HPI Comments: Patient with LVAD placement 6 weeks ago with EF of 15%. He was seen at his cardiologist office earlier today and was having near syncope with dizziness and lightheadedness. He was evaluated with negative EKG and echocardiogram and felt better after eating and getting IV fluids. Upon leaving the office he developed some lower abdominal pain and had 2 episodes of vomiting. Abdominal pain is now resolved. No chest pain or shortness of breath. His wife states he was less responsive in the car before vomiting. He denies any symptoms currently. No headache or back pain. Abdominal pain has resolved. No further episodes of vomiting. No chest pain or shortness of breath.  The history is provided by the patient and the EMS personnel.    Past Medical History  Diagnosis Date  . Ischemic cardiomyopathy     a. 10/09 Echo: Sev LV Dysfxn, inf/lat AK, Mod MR.  [name];  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 Hypertension   . PVC (premature ventricular contraction)   . Pacemaker   . CHF (congestive heart failure)   . Shortness of breath   . Automatic implantable cardioverter-defibrillator in situ    Past Surgical History  Procedure Laterality Date  . Defibrillator  2009  . Total knee arthroplasty  11/27/11    left  . Insert / replace / remove pacemaker  2009  . Coronary angioplasty  with stent placement  2004  . Coronary angioplasty with stent placement  07/2008  . Knee arthroscopy w/ partial medial meniscectomy  12/2002    left  . Knee arthroscopy      bilaterally  . Total knee arthroplasty  11/27/2011    Procedure: TOTAL KNEE ARTHROPLASTY;  Surgeon: Yvette Rack., MD;  Location: [place];  Service: Orthopedics;  Laterality: Left;  left total knee arthroplasty  . Esophagogastroduodenoscopy N/A 09/15/2013    Procedure: ESOPHAGOGASTRODUODENOSCOPY (EGD);  Surgeon: Ladene Artist, MD;  Location: [facility] ENDOSCOPY;  Service: Endoscopy;  Laterality: N/A;  bedside  . Insertion of implantable left ventricular assist device N/A 09/18/2013    Procedure: INSERTION OF IMPLANTABLE LEFT VENTRICULAR ASSIST DEVICE;  Surgeon: Ivin Poot, MD;  Location: [place];  Service: Open Heart Surgery;  Laterality: N/A;  CIRC ARREST  NITRIC OXIDE  . Intraoperative transesophageal echocardiogram N/A 09/18/2013    Procedure: INTRAOPERATIVE TRANSESOPHAGEAL ECHOCARDIOGRAM;  Surgeon: Ivin Poot, MD;  Location: [place];  Service: Open Heart Surgery;  Laterality: N/A;   Family History  Problem Relation Age of Onset  . Heart failure Father   . Stroke Father   . Coronary artery disease Mother   . Heart disease Mother   . Hyperlipidemia Sister    History  Substance Use Topics  . Smoking status: Never Smoker   .  Smokeless tobacco: Never Used  . Alcohol Use: Yes     Comment: "occassionlly; last beer was 09/2011"    Review of Systems  Constitutional: Positive for fatigue. Negative for fever and activity change.  HENT: Negative for congestion and rhinorrhea.   Respiratory: Negative for cough, chest tightness and shortness of breath.   Cardiovascular: Negative for chest pain.  Gastrointestinal: Positive for nausea, vomiting and abdominal pain.  Genitourinary: Negative for dysuria and hematuria.  Musculoskeletal: Negative for back pain and neck pain.  Skin: Negative for rash.  Neurological: Positive  for dizziness, weakness and light-headedness. Negative for headaches.  A complete 10 system review of systems was obtained and all systems are negative except as noted in the HPI and PMH.      Allergies  Diovan and Lipitor  Home Medications   No current outpatient prescriptions on file. BP   Pulse 91  Temp(Src) 98.2 F (36.8 C)  Resp 16  SpO2 98% Physical Exam  Constitutional: He is oriented to person, place, and time. He appears well-developed and well-nourished. No distress.  HENT:  Head: Normocephalic and atraumatic.  Mouth/Throat: Oropharynx is clear and moist. No oropharyngeal exudate.  Eyes: Conjunctivae and EOM are normal. Pupils are equal, round, and reactive to light.  Neck: Normal range of motion. Neck supple.  Cardiovascular:  No murmur heard. Hum of LVAD. No palpable pulses  Pulmonary/Chest: Effort normal and breath sounds normal. No respiratory distress.  Abdominal: Soft. He exhibits distension. There is no tenderness. There is no rebound and no guarding.  LVAD in place  Musculoskeletal: Normal range of motion. He exhibits no edema and no tenderness.  Neurological: He is oriented to person, place, and time. No cranial nerve deficit. He exhibits normal muscle tone. Coordination normal.  Skin: Skin is warm.    ED Course  Procedures (including critical care time) Labs Review Labs Reviewed  CBC WITH DIFFERENTIAL - Abnormal; Notable for the following:    WBC 10.7 (*)    Hemoglobin 12.5 (*)    HCT 38.7 (*)    RDW 20.0 (*)    Neutrophils Relative % 83 (*)    Neutro Abs 8.8 (*)    Lymphocytes Relative 9 (*)    All other components within normal limits  COMPREHENSIVE METABOLIC PANEL - Abnormal; Notable for the following:    Glucose, Bld 116 (*)    Creatinine, Ser 1.37 (*)    Albumin 3.3 (*)    Alkaline Phosphatase 134 (*)    GFR calc non Af Amer 51 (*)    GFR calc Af Amer 59 (*)    All other components within normal limits  PROTIME-INR - Abnormal; Notable  for the following:    Prothrombin Time 34.3 (*)    INR 3.56 (*)    All other components within normal limits  URINALYSIS, ROUTINE W REFLEX MICROSCOPIC - Abnormal; Notable for the following:    APPearance CLOUDY (*)    Bilirubin Urine SMALL (*)    All other components within normal limits  MRSA PCR SCREENING  TROPONIN I  LIPASE, BLOOD  COMPREHENSIVE METABOLIC PANEL  LACTATE DEHYDROGENASE  PROTIME-INR  CBC  I-STAT CG4 LACTIC ACID, ED   Imaging Review Dg Chest 2 View  11/01/2013   CLINICAL DATA:  Fall.  LVAD  EXAM: CHEST  2 VIEW  COMPARISON:  09/28/2013  FINDINGS: There is a left chest wall ICD with lead in the right ventricle and right atrial appendage. The left ventricular assist device is identified and appears  unchanged in position from previous exam. Stable appearance of left upper lobe perihilar opacity and pleural thickening of the major fissure. No displaced rib fractures noted.  IMPRESSION: 1. No acute findings and no significant change from 10/02/2013   Electronically Signed   By: Kerby Moors M.D.   On: 11/01/2013 16:34   Ct Abdomen Pelvis W Contrast  11/02/2013   CLINICAL DATA:  Vomiting.  Lethargic.  EXAM: CT ABDOMEN AND PELVIS WITH CONTRAST  TECHNIQUE: Multidetector CT imaging of the abdomen and pelvis was performed using the standard protocol following bolus administration of intravenous contrast.  CONTRAST:  140mL OMNIPAQUE IOHEXOL 300 MG/ML  SOLN  COMPARISON:  11/01/2013 plain film exam.  09/13/2013 CT.  FINDINGS: Left ventricular assist device is in place causes streak artifact. Cardiomegaly. Pleural effusions. Basilar atelectasis.  Dilated fluid and gas-filled stomach. Contrast extends into nondilated duodenum. Beyond this region, fluid and gas-filled dilated loops of small bowel consistent with small bowel obstruction. Cause indeterminate. This may be related to adhesions. Fluid within the pelvis most likely is related to the small bowel obstruction. No free intraperitoneal  air. No inflammation surrounds the appendix or sigmoid diverticula.  Taking into account limitation by streak artifact from left ventricular assist device, no worrisome hepatic, splenic, pancreatic, adrenal or renal lesion is noted. There are sub cm low-density structures within the inferior aspect the left kidney which may be a cyst although too small to characterize.  Atherosclerotic type changes of the abdominal aorta most notable at the aortic bifurcation without high-grade stenosis. Mild ectasia proximal left common iliac artery.  Decompressed urinary bladder. Slightly heterogeneous appearance of the prostate gland. Clinical and laboratory correlation recommended.  Degenerative changes lumbar spine without bony destructive lesion.  IMPRESSION: Small bowel obstructive pattern as detailed above.  These results were called by telephone at the time of interpretation on 11/02/2013 at 1:06 AM to [facility] patients nurse, who verbally acknowledged these results.   Electronically Signed   By: Chauncey Cruel M.D.   On: 11/02/2013 01:07   Dg Abd Acute W/chest  11/01/2013   CLINICAL DATA:  Vomiting.  Syncope.  EXAM: ACUTE ABDOMEN SERIES (ABDOMEN 2 VIEW & CHEST 1 VIEW)  COMPARISON:  11/01/2013 chest x-ray.  10/02/2013 chest CT.  FINDINGS: Left ventricular assist device is place. Cardiomegaly. AICD enters from the left with leads unchanged in position. Post median sternotomy.  Consolidation right mid lung. This may represent fluid within the fissure. Follow-up and clearance recommended.  No pulmonary edema or pneumothorax.  Abnormal bowel gas pattern with gas distended small bowel loops measuring up to 4.4 cm. The findings suggestive of small bowel obstruction without evidence of free intraperitoneal air.  IMPRESSION: Small bowel obstructive pattern.  Consolidation right mid lung. This may represent fluid within the fissure. Follow-up and clearance recommended.  Please see above.   Electronically Signed   By: Chauncey Cruel M.D.    On: 11/01/2013 18:59     EKG Interpretation None      MDM   Final diagnoses:  Small bowel obstruction  Syncope   Syncopal episode with abdominal pain nausea vomiting after eating. Now resolved. No chest pain or shortness of breath.  EKG unchanged, troponin negative. INR 3.5  AAS shows SBO pattern.  No vomiting in the ED.  D/w Dr. Hulen Skains who will consult. He does not feel that patient needs an NG at this time.  Recommends CT.  CT confirms SBO. No evidence of ischemic bowel. Dr. Haroldine Laws to admit.  Ezequiel Essex, MD 11/02/13 [phone]

## 2013-11-01 NOTE — Progress Notes (Signed)
  Echocardiogram 2D Echocardiogram (limited) has been performed.  [place], [place] 11/01/2013, 3:12 PM

## 2013-11-01 NOTE — Consult Note (Signed)
Reason for Consult:Possible bowel obstruction. Referring Physician: Dr. Geri Seminole is an 73 y.o. male.  HPI: The patient is a VAD cardiac patient. He was sent to the emergency department because of episodes of nausea vomiting and abdominal pain. Plain abdominal films demonstrated dilated small bowel loops with stacking staircase in. The patient's abdominal pain has resolved. He had a normal bowel movement this morning. A CT scan of his abdomen and pelvis is pending however currently do not feel as though the patient requires an NG tube.  Past Medical History  Diagnosis Date  . Ischemic cardiomyopathy     a. 10/09 Echo: Sev LV Dysfxn, inf/lat AK, Mod MR.  [name];  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 Hypertension   . PVC (premature ventricular contraction)   . Pacemaker   . CHF (congestive heart failure)   . Shortness of breath   . Automatic implantable cardioverter-defibrillator in situ     Past Surgical History  Procedure Laterality Date  . Defibrillator  2009  . Total knee arthroplasty  11/27/11    left  . Insert / replace / remove pacemaker  2009  . Coronary angioplasty with stent placement  2004  . Coronary angioplasty with stent placement  07/2008  . Knee arthroscopy w/ partial medial meniscectomy  12/2002    left  . Knee arthroscopy      bilaterally  . Total knee arthroplasty  11/27/2011    Procedure: TOTAL KNEE ARTHROPLASTY;  Surgeon: Yvette Rack., MD;  Location: [place];  Service: Orthopedics;  Laterality: Left;  left total knee arthroplasty  . Esophagogastroduodenoscopy N/A 09/15/2013    Procedure:  ESOPHAGOGASTRODUODENOSCOPY (EGD);  Surgeon: Ladene Artist, MD;  Location: [facility] ENDOSCOPY;  Service: Endoscopy;  Laterality: N/A;  bedside  . Insertion of implantable left ventricular assist device N/A 09/18/2013    Procedure: INSERTION OF IMPLANTABLE LEFT VENTRICULAR ASSIST DEVICE;  Surgeon: Ivin Poot, MD;  Location: [place];  Service: Open Heart Surgery;  Laterality: N/A;  CIRC ARREST  NITRIC OXIDE  . Intraoperative transesophageal echocardiogram N/A 09/18/2013    Procedure: INTRAOPERATIVE TRANSESOPHAGEAL ECHOCARDIOGRAM;  Surgeon: Ivin Poot, MD;  Location: [place];  Service: Open Heart Surgery;  Laterality: N/A;    Family History  Problem Relation Age of Onset  . Heart failure Father   . Stroke Father   . Coronary artery disease Mother   . Heart disease Mother   . Hyperlipidemia Sister     Social History:  reports that he has never smoked. He has never used smokeless tobacco. He reports that he drinks alcohol. He reports that he does not use illicit drugs.  Allergies:  Allergies  Allergen Reactions  . Diovan [Valsartan] Other (See Comments)    Hypotension at low dose, hyperkalemia  . Lipitor [Atorvastatin Calcium] Other (See Comments)    Muscle pain    Medications: I have reviewed the patient's current medications.  Results for orders placed during the hospital encounter of 11/01/13 (from the past 48  hour(s))  CBC WITH DIFFERENTIAL     Status: Abnormal   Collection Time    11/01/13  6:12 PM      Result Value Ref Range   WBC 10.7 (*) 4.0 - 10.5 K/uL   RBC 4.52  4.22 - 5.81 MIL/uL   Hemoglobin 12.5 (*) 13.0 - 17.0 g/dL   HCT 38.7 (*) 39.0 - 52.0 %   MCV 85.6  78.0 - 100.0 fL   MCH 27.7  26.0 - 34.0 pg   MCHC 32.3  30.0 - 36.0 g/dL   RDW 20.0 (*) 11.5 - 15.5 %   Platelets 234  150 - 400 K/uL   Neutrophils Relative % 83 (*) 43 - 77 %   Neutro Abs 8.8 (*) 1.7 - 7.7 K/uL   Lymphocytes Relative 9 (*) 12 - 46 %   Lymphs Abs 1.0  0.7 - 4.0 K/uL   Monocytes Relative 7  3  - 12 %   Monocytes Absolute 0.7  0.1 - 1.0 K/uL   Eosinophils Relative 1  0 - 5 %   Eosinophils Absolute 0.2  0.0 - 0.7 K/uL   Basophils Relative 0  0 - 1 %   Basophils Absolute 0.0  0.0 - 0.1 K/uL  COMPREHENSIVE METABOLIC PANEL     Status: Abnormal   Collection Time    11/01/13  6:12 PM      Result Value Ref Range   Sodium 139  137 - 147 mEq/L   Potassium 4.3  3.7 - 5.3 mEq/L   Chloride 102  96 - 112 mEq/L   CO2 23  19 - 32 mEq/L   Glucose, Bld 116 (*) 70 - 99 mg/dL   BUN 17  6 - 23 mg/dL   Creatinine, Ser 1.37 (*) 0.50 - 1.35 mg/dL   Calcium 8.8  8.4 - 10.5 mg/dL   Total Protein 7.6  6.0 - 8.3 g/dL   Albumin 3.3 (*) 3.5 - 5.2 g/dL   AST 36  0 - 37 U/L   ALT 28  0 - 53 U/L   Alkaline Phosphatase 134 (*) 39 - 117 U/L   Total Bilirubin 0.6  0.3 - 1.2 mg/dL   GFR calc non Af Amer 51 (*) >90 mL/min   GFR calc Af Amer 59 (*) >90 mL/min   Comment: (NOTE)     The eGFR has been calculated using the CKD EPI equation.     This calculation has not been validated in all clinical situations.     eGFR's persistently <90 mL/min signify possible Chronic Kidney     Disease.  TROPONIN I     Status: None   Collection Time    11/01/13  6:12 PM      Result Value Ref Range   Troponin I <0.30  <0.30 ng/mL   Comment:            Due to the release kinetics of cTnI,     a negative result within the first hours     of the onset of symptoms does not rule out     myocardial infarction with certainty.     If myocardial infarction is still suspected,     repeat the test at appropriate intervals.  PROTIME-INR     Status: Abnormal   Collection Time    11/01/13  6:12 PM      Result Value Ref Range   Prothrombin Time 34.3 (*) 11.6 - 15.2 seconds   INR 3.56 (*) 0.00 -  1.49  LIPASE, BLOOD     Status: None   Collection Time    11/01/13  6:12 PM      Result Value Ref Range   Lipase 39  11 - 59 U/L  I-STAT CG4 LACTIC ACID, ED     Status: None   Collection Time    11/01/13  6:38 PM      Result Value  Ref Range   Lactic Acid, Venous 1.31  0.5 - 2.2 mmol/L    Dg Chest 2 View  11/01/2013   CLINICAL DATA:  Fall.  LVAD  EXAM: CHEST  2 VIEW  COMPARISON:  09/28/2013  FINDINGS: There is a left chest wall ICD with lead in the right ventricle and right atrial appendage. The left ventricular assist device is identified and appears unchanged in position from previous exam. Stable appearance of left upper lobe perihilar opacity and pleural thickening of the major fissure. No displaced rib fractures noted.  IMPRESSION: 1. No acute findings and no significant change from 10/02/2013   Electronically Signed   By: Kerby Moors M.D.   On: 11/01/2013 16:34   Dg Abd Acute W/chest  11/01/2013   CLINICAL DATA:  Vomiting.  Syncope.  EXAM: ACUTE ABDOMEN SERIES (ABDOMEN 2 VIEW & CHEST 1 VIEW)  COMPARISON:  11/01/2013 chest x-ray.  10/02/2013 chest CT.  FINDINGS: Left ventricular assist device is place. Cardiomegaly. AICD enters from the left with leads unchanged in position. Post median sternotomy.  Consolidation right mid lung. This may represent fluid within the fissure. Follow-up and clearance recommended.  No pulmonary edema or pneumothorax.  Abnormal bowel gas pattern with gas distended small bowel loops measuring up to 4.4 cm. The findings suggestive of small bowel obstruction without evidence of free intraperitoneal air.  IMPRESSION: Small bowel obstructive pattern.  Consolidation right mid lung. This may represent fluid within the fissure. Follow-up and clearance recommended.  Please see above.   Electronically Signed   By: Chauncey Cruel M.D.   On: 11/01/2013 18:59    Review of Systems  Constitutional: Negative for fever and chills.  Gastrointestinal: Positive for nausea and vomiting. Negative for diarrhea and blood in stool.  Neurological: Positive for dizziness.       Syncopal episode   Blood pressure , pulse 84, temperature 98.2 F (36.8 C), resp. rate 17, SpO2 97.00%. Physical Exam  Constitutional: He is  oriented to person, place, and time. He appears well-developed and well-nourished.  HENT:  Head: Normocephalic and atraumatic.  Eyes: Conjunctivae are normal.  Neck: Normal range of motion. Neck supple.  Cardiovascular:  VAd in place without palpable pulse  Respiratory: Effort normal and breath sounds normal.  GI: Soft. Bowel sounds are normal. He exhibits distension (distended but not tense). There is no tenderness. There is no rebound and no guarding.  Neurological: He is alert and oriented to person, place, and time. He has normal reflexes.  Skin: Skin is warm and dry.  Psychiatric: He has a normal mood and affect. His behavior is normal. Judgment and thought content normal.    Assessment/Plan: Abdominal x-rays demonstrated dilated small bowel loops with the possibility of a small bowel obstruction. Outside of hisVAD placement and procedure the patient has not had any intra-abdominal surgery. He would be unusual in the setting to get a small bowel obstruction. His abdominal pain, nausea, and vomiting succeeded his syncopal episodes and they have been compared to the same problem the cause of syncope many some type of hypotensive event. This could  lead to some intermittent ischemia to small bowel with a subsequent development of a significant ileus. I would suggest getting a CT scan of his abdomen and pelvis to rule out frankly necrotic bowel however looking at his plain films he has gas that extends all the way down into his: As rectum and this looks more like an ileus and not a mechanical small bowel obstruction.  The patient did not require an NG tube at this time. He no longer has any abdominal pain and is completely nontender. He's had no further vomiting. Repeat abdominal films in the morning would be suggested.  NABOR, THOMANN 11/01/2013, 8:13 PM

## 2013-11-01 NOTE — Progress Notes (Signed)
Patient ID: Johnny Oneill, male   DOB: [birth year], 73 y.o.   MRN: [id number]   HPI: Johnny Oneill is a 73 year old patient with a history of CAD s/p stent to LCX 2004 with re-occlusion of LCX with lateral MI 2009 and repeat stenting, total occlusion of RCA, ICM, chronic systolic heart failure, hypothyroidism, HLD and arthritis. 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   Admitted to [facility] 09/13/13 through 10/03/13. Initially admitted with decompenstaed heart failure and low output. Required IABP for stabilization. He ultimately had LVAD HM II placed on 09/19/13. He suffered from RV failure after the procedure and required milrinone for >7 days which was gradually weaned to off. During his stay he did develop acute ICU psychosis with delirium which was treated with Zyprexa. Once he was transferred out of the ICU this improved and he returned back to baseline. He was not discharged on beta blocker. D/C weight 151 pounds.  He returns for follow up with his wife. Had syncopal episode this waiting room . Denies SOB/PND/Orthopnea. Complains of dizziness over the last 48 hours. Had presyncope yesterday and PI was 4.7.  Weight at home 149-150 pounds.  Following low salt diet and limiting fluids to < 2 liters per day. He performs dressing changes.   Denies LVAD alarms.  Denies driveline trauma, erythema or drainage.  Denies ICD shocks.   Reports taking Coumadin as prescribed and adherence to anticoagulation based dietary restrictions.  Denies bright red blood per rectum or melena, no dark urine or hematuria.     Past Medical History  Diagnosis Date  . Ischemic cardiomyopathy     a. 10/09 Echo: Sev LV Dysfxn, inf/lat AK, Mod MR.  [name];  s/p Lat MI 2009 with occlusion of the LCX - treated with Promus stenting. Has total occlusion of the RCA.   . ICD  (implantable cardiac defibrillator) in place     PROPHYLACTIC      medtronic  . RBBB   . 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 Hypertension   . PVC (premature ventricular contraction)   . Pacemaker   . CHF (congestive heart failure)   . Shortness of breath   . Automatic implantable cardioverter-defibrillator in situ     Current Outpatient Prescriptions  Medication Sig Dispense Refill  . amiodarone (PACERONE) 200 MG tablet Take 1 tablet (200 mg total) by mouth 2 (two) times daily.  60 tablet  6  . amLODipine (NORVASC) 10 MG tablet Take 1 tablet (10 mg total) by mouth daily.  30 tablet  3  . aspirin 325 MG tablet Take 325 mg by mouth daily.        . Coenzyme Q10 (CO Q-10 PO) Take 1 capsule by mouth daily.       [name] docusate sodium 100 MG CAPS Take 200 mg by mouth daily as needed for mild constipation.  15 capsule  3  . ezetimibe (ZETIA) 10 MG tablet Take 10 mg by mouth daily.      . ferrous Q000111Q C-folic acid (TRINSICON / FOLTRIN) capsule Take 1 capsule by mouth every morning.  30 capsule  3  . furosemide (LASIX) 20 MG tablet Take 1 tablet (20 mg total) by mouth once a week. Every Monday, may take extra dose if weight gain > 3  lbs  45 tablet  6  . levothyroxine (SYNTHROID, LEVOTHROID) 150 MCG tablet 75-150 mcg daily before breakfast. Take 1 tablet (150 mcg) daily on an empty stomach except take 1/2 tablet (75 mcg) every Friday as directed.      [name] losartan (COZAAR) 100 MG tablet Take 0.5 tablets (50 mg total) by mouth daily.  90 tablet  3  . nitroGLYCERIN (NITROSTAT) 0.4 MG SL tablet Place 0.4 mg under the tongue every 5 (five) minutes as needed. For chest pain      . Omega-3 300 MG CAPS Take 300 mg by mouth daily.       . pantoprazole (PROTONIX) 40 MG tablet Take 1 tablet (40 mg total) by mouth daily at 12 noon.  30 tablet  6  . rosuvastatin (CRESTOR) 20 MG tablet Take 20 mg by mouth daily.      [name] spironolactone (ALDACTONE) 25 MG tablet Take 1 tablet (25 mg total) by mouth daily.  30  tablet  3  . traMADol (ULTRAM) 50 MG tablet Take 1 tablet (50 mg total) by mouth every 6 (six) hours as needed for moderate pain.  30 tablet  0  . warfarin (COUMADIN) 5 MG tablet Take 1 tablet (5 mg total) by mouth daily at 6 PM.  30 tablet  3   No current facility-administered medications for this encounter.    Diovan and Lipitor  REVIEW OF SYSTEMS: All systems negative except as listed in HPI, PMH and Problem list.   LVAD INTERROGATION:   HeartMate II LVAD:  Flow 4.1  liters/min, speed 9400, power 5.2, PI 5.52. Rare PI  Fixed Speed 9400 Back UP Speed 8800. Rare PI events  I reviewed the LVAD parameters from today, and compared the results to the patient's prior recorded data.  No programming changes were made.  The LVAD is functioning within specified parameters.  The patient performs LVAD self-test daily.  LVAD interrogation was negative for any significant power changes, alarms or PI events/speed drops.  LVAD equipment check completed and is in good working order.  Back-up equipment present.   LVAD education done on emergency procedures and precautions and reviewed exit site care.    Filed Vitals:   11/01/13 1109  BP: 94/0  Pulse: 83  SpO2: 97%    Physical Exam: GENERAL: Well appearing, male who presents to clinic today. Had syncopal event in the waiting room and required transport into clinic via wheelchair followed by assistance getting on stretcher.  HEENT: normal  NECK: Supple, JVP flat+ prominent CV waves  .  2+ bilaterally, no bruits.  No lymphadenopathy or thyromegaly appreciated.   CARDIAC:  Mechanical heart sounds with LVAD hum present.  LUNGS:  Clear to auscultation bilaterally.  ABDOMEN:  Soft, round, nontender, positive bowel sounds x4.     LVAD exit site: well-healed and incorporated.  Dressing dry and intact.  No erythema or drainage.  Stabilization device present and accurately applied.  Driveline dressing is being changed daily per sterile technique. EXTREMITIES:   Warm and dry, no cyanosis, clubbing, rash or edema  NEUROLOGIC:  Alert and oriented x 4.  Gait steady.  No aphasia.  No dysarthria.  Affect pleasant.       ASSESSMENT AND PLAN:   1. Status post LVAD implantation 09/19/13> Prior to visit he had syncopal episode in the waiting room. Currently has NYHA class II symptoms on LVAD support but with ongoing dizziness.  Volume status low. Will need to stop lasix and spironolactone and allow his  weight to drift up.  He was intolerant of up titration of losartan to 100 mg due to dizziness so he will continue losartan 50 mg daily .  While in the clinic his MAP dropped into the 60s and he received 500 cc NS bolus.  Hold off on bb for now.   Check BMET, LDH, CBC, INR today  2.  Syncope- likely due to low volume status as above. ICD interrogated with no events.  3. Anticoagulation management - INR goal 2.0-3.0.  Will evaluate INR value today and follow up with patient for necessary changes.  Dose per protocol.  4. NSVT- Medtronic device interrogated. No VT noted. On amiodarone. Check TSH, T3, T4   Follow up in one week.   CLEGG,AMY NP-C 11:20 AM  Patient seen and examined with Darrick Grinder, NP. We discussed all aspects of the encounter. I agree with the assessment and plan as stated above.Mr. Obier experienced syncopal episode on his way into clinic. He was hypotensive and appeared volume depleted. He improved significantly with IV resuscitation, Echo done in Clinic shows relatively preserved RV function. VAD and ICD interrogation done personally and both look OK. Will stop diuretics and continue to follow him closely. He is to return to ER immediately if has recurrent symptoms.   Total time spent 90 minutes. Over half that time spent at bedside providing care and discussing above.   Melville Engen,MD 10:05 PM

## 2013-11-01 NOTE — ED Notes (Signed)
Per EMS- Pt has LVAD placed 6 weeks ago. Pt was at [place] sitting in car while wife went into pharmacy, when she returned pt had vomited on himself and was lethargic. Pt was seen at HF clinic earlier with syncopal episode. 22 left AC. HR 82, 22 RR, 97%RA. Pt is a x 4.

## 2013-11-01 NOTE — Progress Notes (Signed)
Patient needed IV start for fluid bolus during OP visit today, first attempt LAFA unsuccessful due to dehydration, 2nd attempt in RAFA with 24g successful.  500cc 0.9% NS bolus administered.  Patient tolerated well.

## 2013-11-01 NOTE — Patient Instructions (Addendum)
1.  Stop Lasix 2.  Stop Aldactone 3.  Stop Losartan 4.  If weight above 155 lbs, call VAD clinic 5.  Advance VAD dressing to every other day 6.  Hold coumadin today; then change dosing to 5 mg daily except 2.5 mg on Tues/Thurs/ Sat.  Will repeat INR next week at clinic visit. 7.  Chest X-ray today 8.  Return to [place] clinic in one week 9.  Keflex 500 mg twice daily for 5 days (antibiotic)

## 2013-11-02 ENCOUNTER — Inpatient Hospital Stay (HOSPITAL_COMMUNITY): Payer: Medicare Other

## 2013-11-02 DIAGNOSIS — I878 Other specified disorders of veins: Secondary | ICD-10-CM

## 2013-11-02 DIAGNOSIS — K56609 Unspecified intestinal obstruction, unspecified as to partial versus complete obstruction: Secondary | ICD-10-CM

## 2013-11-02 DIAGNOSIS — I998 Other disorder of circulatory system: Secondary | ICD-10-CM

## 2013-11-02 DIAGNOSIS — I5022 Chronic systolic (congestive) heart failure: Secondary | ICD-10-CM

## 2013-11-02 LAB — COMPREHENSIVE METABOLIC PANEL
ALT: 23 U/L (ref 0–53)
ALT: 22 U/L (ref 0–53)
AST: 31 U/L (ref 0–37)
AST: 32 U/L (ref 0–37)
Albumin: 3 g/dL — ABNORMAL LOW (ref 3.5–5.2)
Albumin: 2.9 g/dL — ABNORMAL LOW (ref 3.5–5.2)
Alkaline Phosphatase: 118 U/L — ABNORMAL HIGH (ref 39–117)
Alkaline Phosphatase: 101 U/L (ref 39–117)
BUN: 14 mg/dL (ref 6–23)
BUN: 15 mg/dL (ref 6–23)
CO2: 22 mEq/L (ref 19–32)
CO2: 23 mEq/L (ref 19–32)
Calcium: 8.8 mg/dL (ref 8.4–10.5)
Calcium: 8.6 mg/dL (ref 8.4–10.5)
Chloride: 102 mEq/L (ref 96–112)
Chloride: 101 mEq/L (ref 96–112)
Creatinine, Ser: 1.12 mg/dL (ref 0.50–1.35)
Creatinine, Ser: 1.21 mg/dL (ref 0.50–1.35)
GFR calc Af Amer: 75 mL/min — ABNORMAL LOW (ref >90)
GFR calc Af Amer: 68 mL/min — ABNORMAL LOW (ref >90)
GFR calc non Af Amer: 65 mL/min — ABNORMAL LOW (ref >90)
GFR calc non Af Amer: 59 mL/min — ABNORMAL LOW (ref >90)
Glucose, Bld: 117 mg/dL — ABNORMAL HIGH (ref 70–99)
Glucose, Bld: 96 mg/dL (ref 70–99)
Potassium: 4.3 mEq/L (ref 3.7–5.3)
Potassium: 4.1 mEq/L (ref 3.7–5.3)
Sodium: 137 mEq/L (ref 137–147)
Sodium: 137 mEq/L (ref 137–147)
Total Bilirubin: 0.6 mg/dL (ref 0.3–1.2)
Total Bilirubin: 0.7 mg/dL (ref 0.3–1.2)
Total Protein: 7.3 g/dL (ref 6.0–8.3)
Total Protein: 6.8 g/dL (ref 6.0–8.3)

## 2013-11-02 LAB — CBC
HCT: 39.8 % (ref 39.0–52.0)
HCT: 34.5 % — ABNORMAL LOW (ref 39.0–52.0)
Hemoglobin: 12.5 g/dL — ABNORMAL LOW (ref 13.0–17.0)
Hemoglobin: 10.9 g/dL — ABNORMAL LOW (ref 13.0–17.0)
MCH: 26.9 pg (ref 26.0–34.0)
MCH: 27.3 pg (ref 26.0–34.0)
MCHC: 31.4 g/dL (ref 30.0–36.0)
MCHC: 31.6 g/dL (ref 30.0–36.0)
MCV: 85.8 fL (ref 78.0–100.0)
MCV: 86.5 fL (ref 78.0–100.0)
Platelets: 243 10*3/uL (ref 150–400)
Platelets: 240 10*3/uL (ref 150–400)
RBC: 4.64 MIL/uL (ref 4.22–5.81)
RBC: 3.99 MIL/uL — ABNORMAL LOW (ref 4.22–5.81)
RDW: 20 % — ABNORMAL HIGH (ref 11.5–15.5)
RDW: 20 % — ABNORMAL HIGH (ref 11.5–15.5)
WBC: 8 10*3/uL (ref 4.0–10.5)
WBC: 8.5 10*3/uL (ref 4.0–10.5)

## 2013-11-02 LAB — MRSA PCR SCREENING: MRSA by PCR: NEGATIVE

## 2013-11-02 LAB — MAGNESIUM
Magnesium: 2.1 mg/dL (ref 1.5–2.5)
Magnesium: 2 mg/dL (ref 1.5–2.5)

## 2013-11-02 LAB — PROTIME-INR
INR: 3.15 — ABNORMAL HIGH (ref 0.00–1.49)
INR: 2.17 — ABNORMAL HIGH (ref 0.00–1.49)
Prothrombin Time: 31.2 seconds — ABNORMAL HIGH (ref 11.6–15.2)
Prothrombin Time: 23.5 seconds — ABNORMAL HIGH (ref 11.6–15.2)

## 2013-11-02 LAB — PHOSPHORUS
Phosphorus: 3.5 mg/dL (ref 2.3–4.6)
Phosphorus: 4 mg/dL (ref 2.3–4.6)

## 2013-11-02 LAB — LACTATE DEHYDROGENASE: LDH: 330 U/L — ABNORMAL HIGH (ref 94–250)

## 2013-11-02 MED ORDER — SODIUM CHLORIDE 0.9 % IJ SOLN
10.0000 mL | Freq: Two times a day (BID) | INTRAMUSCULAR | Status: DC
  Administered 2013-11-02 – 2013-11-11 (×16): 10 mL

## 2013-11-02 MED ORDER — LIDOCAINE HCL (PF) 1 % IJ SOLN
INTRAMUSCULAR | Status: AC
  Filled 2013-11-02: qty 5

## 2013-11-02 MED ORDER — TRAVASOL 10 % IV SOLN: INTRAVENOUS | Status: DC

## 2013-11-02 MED ORDER — HYDRALAZINE HCL 20 MG/ML IJ SOLN
10.0000 mg | Freq: Three times a day (TID) | INTRAMUSCULAR | Status: DC
Start: 2013-11-02 — End: 2013-11-05
  Administered 2013-11-02 – 2013-11-05 (×9): 10 mg via INTRAVENOUS
  Filled 2013-11-02: qty 0.5
  Filled 2013-11-02: qty 1
  Filled 2013-11-02 (×2): qty 0.5
  Filled 2013-11-02 (×2): qty 1
  Filled 2013-11-02 (×4): qty 0.5
  Filled 2013-11-02: qty 1
  Filled 2013-11-02 (×2): qty 0.5

## 2013-11-02 MED ORDER — M.V.I. ADULT IV INJ
INTRAVENOUS | Status: AC
  Administered 2013-11-02: 18:00:00 via INTRAVENOUS
  Filled 2013-11-02: qty 1000

## 2013-11-02 MED ORDER — DEXTROSE 5 % IV SOLN
1.0000 g | INTRAVENOUS | Status: DC
  Administered 2013-11-02 – 2013-11-04 (×4): 1 g via INTRAVENOUS
  Filled 2013-11-02 (×5): qty 10

## 2013-11-02 MED ORDER — MIDAZOLAM HCL 2 MG/2ML IJ SOLN
1.0000 mg | Freq: Once | INTRAMUSCULAR | Status: AC
  Administered 2013-11-02: 1 mg via INTRAVENOUS

## 2013-11-02 MED ORDER — PANTOPRAZOLE SODIUM 40 MG IV SOLR
40.0000 mg | Freq: Every day | INTRAVENOUS | Status: DC
  Administered 2013-11-02 – 2013-11-16 (×15): 40 mg via INTRAVENOUS
  Filled 2013-11-02 (×17): qty 40

## 2013-11-02 MED ORDER — SODIUM CHLORIDE 0.9 % IJ SOLN
10.0000 mL | INTRAMUSCULAR | Status: DC | PRN
  Administered 2013-11-02 – 2013-11-03 (×2): 10 mL

## 2013-11-02 MED ORDER — LORAZEPAM 2 MG/ML IJ SOLN
1.0000 mg | Freq: Four times a day (QID) | INTRAMUSCULAR | Status: DC | PRN
  Administered 2013-11-02 – 2013-11-16 (×6): 1 mg via INTRAVENOUS
  Filled 2013-11-02 (×6): qty 1

## 2013-11-02 MED ORDER — FAT EMULSION 20 % IV EMUL
250.0000 mL | INTRAVENOUS | Status: AC
  Administered 2013-11-02: 250 mL via INTRAVENOUS
  Filled 2013-11-02: qty 250

## 2013-11-02 MED ORDER — LEVOTHYROXINE SODIUM 100 MCG IV SOLR
37.5000 ug | INTRAVENOUS | Status: DC
Start: 2013-11-03 — End: 2013-11-03
  Administered 2013-11-03: 37.5 ug via INTRAVENOUS
  Filled 2013-11-02: qty 5

## 2013-11-02 MED ORDER — PHENOL 1.4 % MT LIQD
1.0000 | OROMUCOSAL | Status: DC | PRN
  Administered 2013-11-12 (×2): 1 via OROMUCOSAL
  Filled 2013-11-02 (×2): qty 177

## 2013-11-02 MED ORDER — MIDAZOLAM HCL 2 MG/2ML IJ SOLN
INTRAMUSCULAR | Status: AC
  Filled 2013-11-02: qty 2

## 2013-11-02 MED ORDER — ONDANSETRON HCL 4 MG/2ML IJ SOLN
4.0000 mg | Freq: Four times a day (QID) | INTRAMUSCULAR | Status: DC | PRN
  Administered 2013-11-05 – 2013-11-13 (×3): 4 mg via INTRAVENOUS
  Filled 2013-11-02 (×3): qty 2

## 2013-11-02 MED ORDER — FENTANYL CITRATE 0.05 MG/ML IJ SOLN
25.0000 ug | INTRAMUSCULAR | Status: DC | PRN
  Administered 2013-11-02 – 2013-11-05 (×4): 25 ug via INTRAVENOUS
  Administered 2013-11-06: 50 ug via INTRAVENOUS
  Administered 2013-11-10: 25 ug via INTRAVENOUS
  Filled 2013-11-02 (×5): qty 2

## 2013-11-02 MED ORDER — INSULIN ASPART 100 UNIT/ML ~~LOC~~ SOLN
0.0000 [IU] | Freq: Four times a day (QID) | SUBCUTANEOUS | Status: DC
  Administered 2013-11-03: 1 [IU] via SUBCUTANEOUS
  Administered 2013-11-04: 2 [IU] via SUBCUTANEOUS
  Administered 2013-11-05: 1 [IU] via SUBCUTANEOUS

## 2013-11-02 MED ORDER — LEVOTHYROXINE SODIUM 100 MCG IV SOLR
75.0000 ug | INTRAVENOUS | Status: DC
Start: 2013-11-02 — End: 2013-11-03
  Administered 2013-11-02: 75 ug via INTRAVENOUS
  Filled 2013-11-02 (×2): qty 5

## 2013-11-02 MED ORDER — DIPHENHYDRAMINE HCL 50 MG/ML IJ SOLN
25.0000 mg | Freq: Every evening | INTRAMUSCULAR | Status: DC | PRN
  Administered 2013-11-02 – 2013-11-16 (×6): 25 mg via INTRAVENOUS
  Filled 2013-11-02 (×6): qty 1

## 2013-11-02 NOTE — Progress Notes (Signed)
Attempted right basilic and brachial veins for PICC insertion.  Unable to advance PICC into SVC due to kept coiling. Pt tolerated procedure well.  Site covered with 2x2 dry dressing. Staff RN notified.

## 2013-11-02 NOTE — Progress Notes (Signed)
Peripherally Inserted Central Catheter/Midline Placement  The IV Nurse has discussed with the patient and/or persons authorized to consent for the patient, the purpose of this procedure and the potential benefits and risks involved with this procedure.  The benefits include less needle sticks, lab draws from the catheter and patient may be discharged home with the catheter.  Risks include, but not limited to, infection, bleeding, blood clot (thrombus formation), and puncture of an artery; nerve damage and irregular heat beat.  Alternatives to this procedure were also discussed. Pt verbalized understanding but requested the wife to sign for him stating he does not have his glasses and cannot see the line.  Both verbalize understanding and states he had a PICC last year.  PICC/Midline Placement Documentation        Johnny Oneill 11/02/2013, 1:12 PM

## 2013-11-02 NOTE — Progress Notes (Signed)
  Sub  Cont small bowel obstruction- abd distended and still Tender Prob related to VAD pump pocket- sig risk of needing surgery Will follow closely, gently reduce INR and place PICC for TNA  Objective: Vital signs in last 24 hours: Temp:  [98 F (36.7 C)-98.4 F (36.9 C)] 98 F (36.7 C) (03/05 0757) Pulse Rate:  [78-91] 78 (03/05 0700) Cardiac Rhythm:  [-] Normal sinus rhythm (03/05 0000) Resp:  [14-21] 14 (03/05 0700) BP: (94)/(0) 94/0 mmHg (03/04 1109) SpO2:  [96 %-99 %] 97 % (03/05 0700) Weight:  [151 lb (68.493 kg)-157 lb 9.6 oz (71.487 kg)] 151 lb (68.493 kg) (03/05 0252)  Hemodynamic parameters for last 24 hours:  afebrile  Intake/Output from previous day: 03/04 0701 - 03/05 0700 In: 100 [I.V.:20; NG/GT:30; IV Piggyback:50] Out: 1390 [Urine:740; Emesis/NG output:650] Intake/Output this shift:   abd tender extrem warm   Lab Results:  Recent Labs  11/01/13 1812 11/02/13 0355  WBC 10.7* 8.0  HGB 12.5* 12.5*  HCT 38.7* 39.8  PLT 234 243   BMET:  Recent Labs  11/01/13 1812 11/02/13 0355  NA 139 137  K 4.3 4.3  CL 102 102  CO2 23 22  GLUCOSE 116* 117*  BUN 17 14  CREATININE 1.37* 1.12  CALCIUM 8.8 8.8    PT/INR:  Recent Labs  11/02/13 0355  LABPROT 31.2*  INR 3.15*   ABG    Component Value Date/Time   PHART 7.449 09/23/2013 0423   HCO3 24.1* 09/23/2013 0423   TCO2 25.1 09/23/2013 0423   ACIDBASEDEF 1.0 09/21/2013 0349   O2SAT 58.9 10/02/2013 0525   CBG (last 3)  No results found for this basename: GLUCAP,  in the last 72 hours  Assessment/Plan: S/P   Treat SBO- NG suction and IVF Appreciate CCS care   LOS: 1 day    VAN TRIGT III,PETER 11/02/2013

## 2013-11-02 NOTE — Progress Notes (Signed)
PARENTERAL NUTRITION CONSULT NOTE - INITIAL  Pharmacy Consult for TPN Indication: SBO  Allergies  Allergen Reactions  . Diovan [Valsartan] Other (See Comments)    Hypotension at low dose, hyperkalemia  . Lipitor [Atorvastatin Calcium] Other (See Comments)    Muscle pain    Patient Measurements: Height: 5\' 9"  (175.3 cm) Weight: 151 lb (68.493 kg) IBW/kg (Calculated) : 70.7 Usual Weight: 68 kg  Vital Signs: Temp: 97.9 F (36.6 C) (03/05 0835) Temp src: Oral (03/05 0815) Pulse Rate: 85 (03/05 1000) Intake/Output from previous day: 03/04 0701 - 03/05 0700 In: 100 [I.V.:20; NG/GT:30; IV Piggyback:50] Out: 1390 [Urine:740; Emesis/NG output:650] Intake/Output from this shift: Total I/O In: 195 [Blood:165; NG/GT:30] Out: 0   Labs:  Recent Labs  11/01/13 1033 11/01/13 1812 11/02/13 0355  WBC 7.3 10.7* 8.0  HGB 12.3* 12.5* 12.5*  HCT 38.4* 38.7* 39.8  PLT 251 234 243  INR 3.74* 3.56* 3.15*     Recent Labs  11/01/13 1033 11/01/13 1812 11/02/13 0355  NA 139 139 137  K 4.4 4.3 4.3  CL 102 102 102  CO2 24 23 22   GLUCOSE 99 116* 117*  BUN 16 17 14   CREATININE 1.37* 1.37* 1.12  CALCIUM 8.6 8.8 8.8  PROT 7.7 7.6 7.3  ALBUMIN 3.1* 3.3* 3.0*  AST 36 36 31  ALT 27 28 23   ALKPHOS 134* 134* 118*  BILITOT 0.5 0.6 0.6   Estimated Creatinine Clearance: 59.5 ml/min (by C-G formula based on Cr of 1.12).   No results found for this basename: GLUCAP,  in the last 72 hours  Medical History: Past Medical History  Diagnosis Date  . Ischemic cardiomyopathy     a. 10/09 Echo: Sev LV Dysfxn, inf/lat AK, Mod MR.  [name];  s/p Lat MI 2009 with occlusion of the LCX - treated with Promus stenting. Has total occlusion of the RCA.   . ICD (implantable cardiac defibrillator) in place     PROPHYLACTIC      medtronic  . RBBB   .  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 Hypertension   . PVC (premature ventricular contraction)   . Pacemaker   . CHF (congestive heart failure)   . Shortness of breath   . Automatic implantable cardioverter-defibrillator in situ     Medications:  Prescriptions prior to admission  Medication Sig Dispense Refill  . amiodarone (PACERONE) 200 MG tablet Take 1 tablet (200 mg total) by mouth 2 (two) times daily.  60 tablet  6  . amLODipine (NORVASC) 10 MG tablet Take 1 tablet (10 mg total) by mouth daily.  30 tablet  3  . aspirin 325 MG tablet Take 325 mg by mouth daily.        . cephALEXin (KEFLEX) 500 MG capsule Take 500 mg by mouth 2 (two) times daily. 5 day course filled 11/01/12      . Coenzyme Q10 (COQ10) 200 MG CAPS Take 200 mg by mouth daily.      [name] ezetimibe (ZETIA) 10 MG tablet Take 10 mg by mouth daily.      . ferrous Q000111Q C-folic acid (TRINSICON / FOLTRIN) capsule Take 1 capsule by mouth every morning.  30 capsule  3  . ibuprofen (ADVIL,MOTRIN) 200 MG tablet Take 200 mg by mouth daily as  needed (pain).      [name] levothyroxine (SYNTHROID, LEVOTHROID) 150 MCG tablet Take 75-150 mcg by mouth daily before breakfast. Take 1 tablet (150 mcg) daily on an empty stomach EXCEPT  take 1/2 tablet (75 mcg) every Friday      . nitroGLYCERIN (NITROSTAT) 0.4 MG SL tablet Place 0.4 mg under the tongue every 5 (five) minutes as needed for chest pain.       . Omega-3 300 MG CAPS Take 300 mg by mouth at bedtime.       . pantoprazole (PROTONIX) 40 MG tablet Take 40 mg by mouth daily.      . rosuvastatin (CRESTOR) 20 MG tablet Take 20 mg by mouth daily.      [name] warfarin (COUMADIN) 5 MG tablet Take 5 mg by mouth daily at 6 PM. Take 1/2 tablet (2.5 mg) on Tues, Thurs, Saturday, take 1 tablet (5 mg) on Sunday, Monday, Wednesday, Friday      . clindamycin (CLEOCIN) 150 MG capsule Take 600 mg by mouth once.         Insulin Requirements in the past 24 hours:  None  Current Nutrition:  NPO  Nutritional  Goals:  Per RD recommendations  Assessment: 73 y.o. male presents with N/V. Noted pt s/p LVAD 09/19/13 - discharged from hospital on 2/3. Found to have SBO on CT scan. Pharmacy consulted to start TPN in this patient.  GI: Noted pt feels better this a.m. but abd remains distended. Pt on bowel rest with NGT. NGT o/p 650 past 12 hrs. Pt has had flatus but no BM since admit  Endo: No h/o DM. Empiric SSI q6h when TPN starts  Lytes: Lytes ok at baseline. Mg, Phos pending.  Renal: SCr 1.12, est CrCl 60 ml/min. MIVF: NS at 10 ml/hr  Pulm:  RA  Cards: history of CAD s/p stent to LCX 2004 with re-occlusion of LCX with lateral MI 2009 and repeat stenting, total occlusion of RCA, ICM, chronic systolic heart failure, s/p LVAD 09/19/13  Hepatobil: LFTs wnl at baseline  Neuro: A&O  ID: Rocephin D#1 -?reason why - was to start cephalexin 3/4 x 5 days as o/p. Afeb. Wbc wnl.  Anticoag: Coumadin PTA for LVAD. INR 3.5 upon admit - coumadin held yesterday. Giving FFP x 1 3/5 so that PICC line can be placed today. Plan for IV heparin when INR<2.  Best Practices: PPI IV, Coum/heparin  TPN Access: PICC to be place 3/5  TPN day#:0  Plan:  1. Once PICC line placed, start Clinimix E 5/15 at 65ml/hr and lipids at 42ml/hr. Will advance to goal as tolerated. 2. Start empiric sensitive SSI q6h when TPN starts 3. Will give multivitamin daily in TPN 4. Will give trace elements on M/W/F due to national shortage. 5. Will f/u RD recommendations 6. Will f/u TPN labs  Sherlon Handing, PharmD, BCPS Clinical pharmacist, pager [phone] 11/02/2013,10:09 AM

## 2013-11-02 NOTE — Progress Notes (Signed)
Received call from Dr. Jeannine Kitten in radiology advising pt has SBO.  Paged Dr. Haroldine Laws to advise.  Received new orders.  Will continue to monitor pt.

## 2013-11-02 NOTE — Progress Notes (Addendum)
ANTICOAGULATION CONSULT NOTE - Initial Consult  Pharmacy Consult for warfarin>>heparin when INR<2 Indication: LVAD  Allergies  Allergen Reactions  . Diovan [Valsartan] Other (See Comments)    Hypotension at low dose, hyperkalemia  . Lipitor [Atorvastatin Calcium] Other (See Comments)    Muscle pain    Patient Measurements: Height: 5\' 9"  (175.3 cm) Weight: 151 lb (68.493 kg) IBW/kg (Calculated) : 70.7   Vital Signs: Temp: 98.4 F (36.9 C) (03/04 2200) Temp src: Oral (03/04 2200) Pulse Rate: 78 (03/05 0700)  Labs:  Recent Labs  11/01/13 1033 11/01/13 1812 11/02/13 0355  HGB 12.3* 12.5* 12.5*  HCT 38.4* 38.7* 39.8  PLT 251 234 243  LABPROT 35.6* 34.3* 31.2*  INR 3.74* 3.56* 3.15*  CREATININE 1.37* 1.37* 1.12  TROPONINI  --  <0.30  --     Estimated Creatinine Clearance: 59.5 ml/min (by C-G formula based on Cr of 1.12).   Medical History: Past Medical History  Diagnosis Date  . Ischemic cardiomyopathy     a. 10/09 Echo: Sev LV Dysfxn, inf/lat AK, Mod MR.  [name];  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 Hypertension   . PVC (premature ventricular contraction)   . Pacemaker   . CHF (congestive heart failure)   . Shortness of breath   . Automatic implantable cardioverter-defibrillator in situ    Assessment: 82 YOM with LVAD 09/19/2013 seen in HF clinic and had syncopal episode- given fluids and labs and ECHO were ok. On the way home, patient vomited and was lethargic and was brought back to the ED for admission found to have SBO.   Patient had elevated INR in clinic of 3.7 and was instructed to hold dose yesterday, it has  since fallen to 3.1, with surgery tentative will hold warfarin for now and start IV heparin if INR falls below 2. Continue to watch daily INRs. TPN will be started for intermittant nutrition.  Goal of Therapy:  INR 2-3  Monitor platelets by anticoagulation protocol: Yes   Plan:  1. Hold warfarin for now, start IV heparin if INR falls below 2 2. Daily PT/INR; CBC tomorrow 3. Follow for s/s bleeding  Erin Hearing PharmD., BCPS Clinical Pharmacist Pager [phone] 11/02/2013 1:04 PM

## 2013-11-02 NOTE — Procedures (Signed)
PROCEDURE NOTE: [facility] [place] CVL PLACEMENT  INDICATION:    Monitoring of central venous pressures and/or administration of medications optimally administered in central vein  CONSENT:   Risks of procedure as well as the alternatives were explained to the patient or surrogate. Consent for procedure obtained. A time out was performed to review patient identification, procedure to be performed, correct patient position, medications/allergies/relevent history, required imaging and test results.  PROCEDURE  Maximum sterile technique was used including antiseptics, cap, gloves, gown, hand hygiene, mask and sheet.  Skin prep: Chlorhexidine; local anesthetic administered  A antimicrobial bonded/coated triple lumen catheter was placed in the [facility] [place] CVL using the Seldinger technique.   EVALUATION:  Blood flow good  Complications: No apparent complications  Patient tolerated the procedure well.  Chest X-ray ordered to verify placement and is pending   Merton Border, MD PCCM service Mobile [phone]

## 2013-11-02 NOTE — Progress Notes (Signed)
Patient ID: PRINCETYN LARICK, male   DOB: 26-Jul-1941, 73 y.o.   MRN: [id number]    Subjective: He feels better this morning. Denies abdominal pain and feels his abdomen is less swollen. States he has had some flatus but no bowel movements.  Objective: Vital signs in last 24 hours: Temp:  [97.9 F (36.6 C)-98.4 F (36.9 C)] 97.9 F (36.6 C) (03/05 0835) Pulse Rate:  [78-91] 83 (03/05 0900) Resp:  [13-21] 16 (03/05 0900) BP: (94)/(0) 94/0 mmHg (03/04 1109) SpO2:  [96 %-99 %] 97 % (03/05 0900) Weight:  [151 lb (68.493 kg)-157 lb 9.6 oz (71.487 kg)] 151 lb (68.493 kg) (03/05 0252)    Intake/Output from previous day: 03/04 0701 - 03/05 0700 In: 100 [I.V.:20; NG/GT:30; IV Piggyback:50] Out: 1390 [Urine:740; Emesis/NG output:650] Intake/Output this shift: Total I/O In: 195 [Blood:165; NG/GT:30] Out: 0   General appearance: alert, cooperative and no distress GI: moderate distention. Soft and nontender. No palpable hernias.  Lab Results:   Recent Labs  11/01/13 1812 11/02/13 0355  WBC 10.7* 8.0  HGB 12.5* 12.5*  HCT 38.7* 39.8  PLT 234 243   BMET  Recent Labs  11/01/13 1812 11/02/13 0355  NA 139 137  K 4.3 4.3  CL 102 102  CO2 23 22  GLUCOSE 116* 117*  BUN 17 14  CREATININE 1.37* 1.12  CALCIUM 8.8 8.8     Studies/Results: Dg Chest 2 View  11/01/2013   CLINICAL DATA:  Fall.  LVAD  EXAM: CHEST  2 VIEW  COMPARISON:  09/28/2013  FINDINGS: There is a left chest wall ICD with lead in the right ventricle and right atrial appendage. The left ventricular assist device is identified and appears unchanged in position from previous exam. Stable appearance of left upper lobe perihilar opacity and pleural thickening of the major fissure. No displaced rib fractures noted.  IMPRESSION: 1. No acute findings and no significant change from 10/02/2013   Electronically Signed   By: Kerby Moors M.D.   On: 11/01/2013 16:34   Ct Abdomen Pelvis W Contrast  11/02/2013   CLINICAL DATA:   Vomiting.  Lethargic.  EXAM: CT ABDOMEN AND PELVIS WITH CONTRAST  TECHNIQUE: Multidetector CT imaging of the abdomen and pelvis was performed using the standard protocol following bolus administration of intravenous contrast.  CONTRAST:  162mL OMNIPAQUE IOHEXOL 300 MG/ML  SOLN  COMPARISON:  11/01/2013 plain film exam.  09/13/2013 CT.  FINDINGS: Left ventricular assist device is in place causes streak artifact. Cardiomegaly. Pleural effusions. Basilar atelectasis.  Dilated fluid and gas-filled stomach. Contrast extends into nondilated duodenum. Beyond this region, fluid and gas-filled dilated loops of small bowel consistent with small bowel obstruction. Cause indeterminate. This may be related to adhesions. Fluid within the pelvis most likely is related to the small bowel obstruction. No free intraperitoneal air. No inflammation surrounds the appendix or sigmoid diverticula.  Taking into account limitation by streak artifact from left ventricular assist device, no worrisome hepatic, splenic, pancreatic, adrenal or renal lesion is noted. There are sub cm low-density structures within the inferior aspect the left kidney which may be a cyst although too small to characterize.  Atherosclerotic type changes of the abdominal aorta most notable at the aortic bifurcation without high-grade stenosis. Mild ectasia proximal left common iliac artery.  Decompressed urinary bladder. Slightly heterogeneous appearance of the prostate gland. Clinical and laboratory correlation recommended.  Degenerative changes lumbar spine without bony destructive lesion.  IMPRESSION: Small bowel obstructive pattern as detailed above.  These results  were called by telephone at the time of interpretation on 11/02/2013 at 1:06 AM to [facility] patients nurse, who verbally acknowledged these results.   Electronically Signed   By: Chauncey Cruel M.D.   On: 11/02/2013 01:07   Dg Chest Port 1 View  11/02/2013   CLINICAL DATA:  VAD  EXAM: PORTABLE CHEST - 1  VIEW  COMPARISON:  DG ABD ACUTE W/CHEST dated 11/01/2013  FINDINGS: The patient is tilted to the left. Cardiac silhouette is enlarged. A PEG status post median sternotomy. A left chest wall dual chamber AICD is appreciated lead tips project in the region the right atrium right ventricle. A ventricular assist device is partially visualized and unchanged when compared to the previous study. An NG tube is seen the appears be curled in the region of the fundus of the stomach. There is mild prominence of the interstitial markings without focal regions of consolidation or focal infiltrates. The osseous structures unremarkable.  IMPRESSION: Support apparatus unchanged. NG tube has been inserted with tip appearing to curl in the region of the fundus of the stomach. Otherwise no evidence of acute cardiopulmonary disease.   Electronically Signed   By: Margaree Mackintosh M.D.   On: 11/02/2013 08:35   Dg Abd Acute W/chest  11/01/2013   CLINICAL DATA:  Vomiting.  Syncope.  EXAM: ACUTE ABDOMEN SERIES (ABDOMEN 2 VIEW & CHEST 1 VIEW)  COMPARISON:  11/01/2013 chest x-ray.  10/02/2013 chest CT.  FINDINGS: Left ventricular assist device is place. Cardiomegaly. AICD enters from the left with leads unchanged in position. Post median sternotomy.  Consolidation right mid lung. This may represent fluid within the fissure. Follow-up and clearance recommended.  No pulmonary edema or pneumothorax.  Abnormal bowel gas pattern with gas distended small bowel loops measuring up to 4.4 cm. The findings suggestive of small bowel obstruction without evidence of free intraperitoneal air.  IMPRESSION: Small bowel obstructive pattern.  Consolidation right mid lung. This may represent fluid within the fissure. Follow-up and clearance recommended.  Please see above.   Electronically Signed   By: Chauncey Cruel M.D.   On: 11/01/2013 18:59   Dg Abd Portable 1v  11/02/2013   CLINICAL DATA:  NG tube placement.  EXAM: PORTABLE ABDOMEN - 1 VIEW  COMPARISON:   Abdominal CT from yesterday  FINDINGS: Interval placement of nasogastric tube, coiled in the fundus.  Unchanged appearance of diffusely dilated small bowel. No acute changes noted in the abdomen. Lung bases remain clear. LVAD and ICD/pacer noted.  IMPRESSION: The new nasogastric tube is coiled in the gastric fundus.   Electronically Signed   By: Jorje Guild M.D.   On: 11/02/2013 03:38    Anti-infectives: Anti-infectives   Start     Dose/Rate Route Frequency Ordered Stop   11/02/13 0130  cefTRIAXone (ROCEPHIN) 1 g in dextrose 5 % 50 mL IVPB     1 g 100 mL/hr over 30 Minutes Intravenous Every 24 hours 11/02/13 0121     11/01/13 2200  cephALEXin (KEFLEX) capsule 500 mg  Status:  Discontinued     500 mg Oral 2 times daily 11/01/13 2157 11/02/13 0121      Assessment/Plan: SBO. Possibly secondary to adhesions from VAD . Clinically he appears somewhat improved today. CT consistent with bowel obstruction but no clear transition point. Continue bowel rest and NG suction IV fluids today. Repeat plain abdominal x-rays tomorrow.    LOS: 1 day    Manraj Yeo T 11/02/2013

## 2013-11-02 NOTE — Progress Notes (Signed)
INITIAL NUTRITION ASSESSMENT  DOCUMENTATION CODES Per approved criteria  -Not Applicable   INTERVENTION: TPN per pharmacy RD to follow for nutrition care plan  NUTRITION DIAGNOSIS: Inadequate oral intake related to altered GI function as evidenced by NPO status  Goal: Pt to meet >/= 90% of their estimated nutrition needs   Monitor:  TPN prescription, PO diet advancement, weight, labs, I/O's  Reason for Assessment: TPN  73 y.o. male  Admitting Dx: Vomiting  ASSESSMENT: Patient is s/p LVAD on 09/19/2013; sent to the ED b/c of episodes of nausea vomiting and abdominal pain.   CT of abdomen/pelvis: dilated fluid and gas-filled stomach. Contrast extends into nondilated duodenum. Beyond this region, fluid and gas-filled dilated loops of small bowel consistent with SBO.   RD consulted for new TPN.  Patient currently in process of having central line placed.  RD unable to interview or complete Nutrition Focused Physical Exam at this time.  Surgery note reviewed -- plan for bowel rest, NGT in place to LIS.  Patient to receive TPN with Clinimix E 5/15 @ 40 ml/hr and lipids @ 10 ml/hr. Provides 1162 kcal, 48 gm protein per day. Meets 56% minimum estimated energy needs, 45% minimum estimated protein needs.  Height: Ht Readings from Last 1 Encounters:  11/01/13 5\' 9"  (1.753 m)    Weight: Wt Readings from Last 1 Encounters:  11/02/13 151 lb (68.493 kg)    Ideal Body Weight: 160 lb  % Ideal Body Weight: 94%  Wt Readings from Last 10 Encounters:  11/02/13 151 lb (68.493 kg)  10/16/13 157 lb 6.4 oz (71.396 kg)  10/10/13 150 lb 12.8 oz (68.402 kg)  10/03/13 151 lb 14.4 oz (68.9 kg)  10/03/13 151 lb 14.4 oz (68.9 kg)  10/03/13 151 lb 14.4 oz (68.9 kg)  10/03/13 151 lb 14.4 oz (68.9 kg)  10/03/13 151 lb 14.4 oz (68.9 kg)  09/11/13 163 lb 12.8 oz (74.299 kg)  08/25/13 160 lb (72.576 kg)    Usual Body Weight: 163 lb -- January 2015  % Usual Body Weight: 93%  BMI:  Body  mass index is 22.29 kg/(m^2).  Estimated Nutritional Needs: Kcal: 2050-2250 Protein: 105-115 gm Fluid: per MD  Skin: Intact  Diet Order: NPO  EDUCATION NEEDS: -No education needs identified at this time   Intake/Output Summary (Last 24 hours) at 11/02/13 1545 Last data filed at 11/02/13 1200  Gross per 24 hour  Intake    325 ml  Output   1790 ml  Net  -1465 ml    Labs:   Recent Labs Lab 11/01/13 1033 11/01/13 1812 11/02/13 0355  NA 139 139 137  K 4.4 4.3 4.3  CL 102 102 102  CO2 24 23 22   BUN 16 17 14   CREATININE 1.37* 1.37* 1.12  CALCIUM 8.6 8.8 8.8  MG  --   --  2.1  PHOS  --   --  3.5  GLUCOSE 99 116* 117*    Scheduled Meds: . cefTRIAXone (ROCEPHIN)  IV  1 g Intravenous Q24H  . hydrALAZINE  10 mg Intravenous 3 times per day  . [START ON 11/03/2013] insulin aspart  0-9 Units Subcutaneous 4 times per day  . levothyroxine  75 mcg Intravenous Once per day on Sun Mon Tue Wed Thu Sat   And  . [START ON 11/03/2013] levothyroxine  37.5 mcg Intravenous Once per day on Fri  . lidocaine (PF)      . midazolam      . pantoprazole (PROTONIX) IV  40 mg Intravenous Daily    Continuous Infusions: . sodium chloride 10 mL/hr at 11/01/13 2300  . Marland KitchenTPN (CLINIMIX-E) Adult     And  . fat emulsion      Past Medical History  Diagnosis Date  . Ischemic cardiomyopathy     a. 10/09 Echo: Sev LV Dysfxn, inf/lat AK, Mod MR.  [name];  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 Hypertension   . PVC (premature ventricular contraction)   . Pacemaker   . CHF (congestive heart failure)   . Shortness of breath   . Automatic implantable  cardioverter-defibrillator in situ     Past Surgical History  Procedure Laterality Date  . Defibrillator  2009  . Total knee arthroplasty  11/27/11    left  . Insert / replace / remove pacemaker  2009  . Coronary angioplasty with stent placement  2004  . Coronary angioplasty with stent placement  07/2008  . Knee arthroscopy w/ partial medial meniscectomy  12/2002    left  . Knee arthroscopy      bilaterally  . Total knee arthroplasty  11/27/2011    Procedure: TOTAL KNEE ARTHROPLASTY;  Surgeon: Yvette Rack., MD;  Location: [place];  Service: Orthopedics;  Laterality: Left;  left total knee arthroplasty  . Esophagogastroduodenoscopy N/A 09/15/2013    Procedure: ESOPHAGOGASTRODUODENOSCOPY (EGD);  Surgeon: Ladene Artist, MD;  Location: [facility] ENDOSCOPY;  Service: Endoscopy;  Laterality: N/A;  bedside  . Insertion of implantable left ventricular assist device N/A 09/18/2013    Procedure: INSERTION OF IMPLANTABLE LEFT VENTRICULAR ASSIST DEVICE;  Surgeon: Ivin Poot, MD;  Location: [place];  Service: Open Heart Surgery;  Laterality: N/A;  CIRC ARREST  NITRIC OXIDE  . Intraoperative transesophageal echocardiogram N/A 09/18/2013    Procedure: INTRAOPERATIVE TRANSESOPHAGEAL ECHOCARDIOGRAM;  Surgeon: Ivin Poot, MD;  Location: [place];  Service: Open Heart Surgery;  Laterality: N/A;    Arthur Holms, RD, LDN Pager #: [phone] After-Hours Pager #: [phone]

## 2013-11-02 NOTE — Progress Notes (Addendum)
Advanced Heart Failure Team History and Physical Note     Reason for Admission: Vomiting, weakness, hypotension   HPI:    Johnny Oneill is a 73 year old patient with a history of CAD s/p stent to LCX 2004 with re-occlusion of LCX with lateral MI 2009 and repeat stenting, total occlusion of RCA, ICM, chronic systolic heart failure, hypothyroidism, HLD and arthritis. 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   Admitted to [facility] 09/13/13 through 10/03/13. Initially admitted with decompenstaed heart failure and low output. Required IABP for stabilization. He ultimately had LVAD HM II placed on 09/19/13. He suffered from RV failure after the procedure and required milrinone for >7 days which was gradually weaned to off. During his stay he did develop acute ICU psychosis with delirium which was treated with Zyprexa. Once he was transferred out of the ICU this improved and he returned back to baseline. He was not discharged on beta blocker. D/C weight 151 pounds.   Was seen in HF Clinic today for routine f/u and had syncopal episode on standing. Found to be orthostatic. Developed N/V and found to have SBO. Now with NGT. Passing gas. No BM. Denies ab pain.    LVAD INTERROGATION:  HeartMate II LVAD: Flow 4.1 liters/min, speed 9400, power 5.3, PI 7.2  Rare PI   I reviewed the LVAD parameters from today, and compared the results to the patient's prior recorded data. No programming changes were made. The LVAD is functioning within specified parameters. The patient performs LVAD self-test daily. LVAD interrogation was negative for any significant power changes, alarms or PI events/speed drops. LVAD equipment check completed and is in good working order. Back-up equipment present. LVAD education done on emergency procedures and precautions and reviewed exit site care.   Physical Exam: GENERAL: Weak appearing. NAD HEENT: normal  +NGT NECK: Supple, JVP flat . 2+ bilaterally, no bruits. No lymphadenopathy  or thyromegaly appreciated.  CARDIAC: Mechanical heart sounds with LVAD hum present.  LUNGS: Clear to auscultation bilaterally.  ABDOMEN: Soft, mildly distended, nontender, hypoactive bowel sounds LVAD exit site: well-healed and incorporated. Dressing dry and intact. No erythema or drainage. Stabilization device present and accurately applied. Driveline dressing is being changed daily per sterile technique.  EXTREMITIES: Warm and dry, no cyanosis, clubbing, rash or edema  NEUROLOGIC: Alert and oriented x 4. Gait steady. No aphasia. No dysarthria. Affect pleasant.    Telemetry: SR   Labs: Basic Metabolic Panel:  Recent Labs Lab 11/01/13 1033 11/01/13 1812 11/02/13 0355  NA 139 139 137  K 4.4 4.3 4.3  CL 102 102 102  CO2 24 23 22   GLUCOSE 99 116* 117*  BUN 16 17 14   CREATININE 1.37* 1.37* 1.12  CALCIUM 8.6 8.8 8.8    Liver Function Tests:  Recent Labs Lab 11/01/13 1033 11/01/13 1812 11/02/13 0355  AST 36 36 31  ALT 27 28 23   ALKPHOS 134* 134* 118*  BILITOT 0.5 0.6 0.6  PROT 7.7 7.6 7.3  ALBUMIN 3.1* 3.3* 3.0*    Recent Labs Lab 11/01/13 1812  LIPASE 39   No results found for this basename: AMMONIA,  in the last 168 hours  CBC:  Recent Labs Lab 11/01/13 1033 11/01/13 1812 11/02/13 0355  WBC 7.3 10.7* 8.0  NEUTROABS  --  8.8*  --   HGB 12.3* 12.5* 12.5*  HCT 38.4* 38.7* 39.8  MCV 85.3 85.6 85.8  PLT 251 234 243    Cardiac Enzymes:  Recent Labs Lab 11/01/13 1812  TROPONINI <0.30  BNP: BNP (last 3 results)  Recent Labs  09/13/13 1349 09/25/13 0400 11/01/13 1033  PROBNP 3883.0* 7019.0* 877.6*    CBG: No results found for this basename: GLUCAP,  in the last 168 hours  Coagulation Studies:  Recent Labs  11/01/13 1033 11/01/13 1812 11/02/13 0355  LABPROT 35.6* 34.3* 31.2*  INR 3.74* 3.56* 3.15*    Other results:   Imaging: Dg Chest 2 View  11/01/2013   CLINICAL DATA:  Fall.  LVAD  EXAM: CHEST  2 VIEW  COMPARISON:   09/28/2013  FINDINGS: There is a left chest wall ICD with lead in the right ventricle and right atrial appendage. The left ventricular assist device is identified and appears unchanged in position from previous exam. Stable appearance of left upper lobe perihilar opacity and pleural thickening of the major fissure. No displaced rib fractures noted.  IMPRESSION: 1. No acute findings and no significant change from 10/02/2013   Electronically Signed   By: Kerby Moors M.D.   On: 11/01/2013 16:34   Ct Abdomen Pelvis W Contrast  11/02/2013   CLINICAL DATA:  Vomiting.  Lethargic.  EXAM: CT ABDOMEN AND PELVIS WITH CONTRAST  TECHNIQUE: Multidetector CT imaging of the abdomen and pelvis was performed using the standard protocol following bolus administration of intravenous contrast.  CONTRAST:  122mL OMNIPAQUE IOHEXOL 300 MG/ML  SOLN  COMPARISON:  11/01/2013 plain film exam.  09/13/2013 CT.  FINDINGS: Left ventricular assist device is in place causes streak artifact. Cardiomegaly. Pleural effusions. Basilar atelectasis.  Dilated fluid and gas-filled stomach. Contrast extends into nondilated duodenum. Beyond this region, fluid and gas-filled dilated loops of small bowel consistent with small bowel obstruction. Cause indeterminate. This may be related to adhesions. Fluid within the pelvis most likely is related to the small bowel obstruction. No free intraperitoneal air. No inflammation surrounds the appendix or sigmoid diverticula.  Taking into account limitation by streak artifact from left ventricular assist device, no worrisome hepatic, splenic, pancreatic, adrenal or renal lesion is noted. There are sub cm low-density structures within the inferior aspect the left kidney which may be a cyst although too small to characterize.  Atherosclerotic type changes of the abdominal aorta most notable at the aortic bifurcation without high-grade stenosis. Mild ectasia proximal left common iliac artery.  Decompressed urinary  bladder. Slightly heterogeneous appearance of the prostate gland. Clinical and laboratory correlation recommended.  Degenerative changes lumbar spine without bony destructive lesion.  IMPRESSION: Small bowel obstructive pattern as detailed above.  These results were called by telephone at the time of interpretation on 11/02/2013 at 1:06 AM to [facility] patients nurse, who verbally acknowledged these results.   Electronically Signed   By: Chauncey Cruel M.D.   On: 11/02/2013 01:07   Dg Chest Port 1 View  11/02/2013   CLINICAL DATA:  VAD  EXAM: PORTABLE CHEST - 1 VIEW  COMPARISON:  DG ABD ACUTE W/CHEST dated 11/01/2013  FINDINGS: The patient is tilted to the left. Cardiac silhouette is enlarged. A PEG status post median sternotomy. A left chest wall dual chamber AICD is appreciated lead tips project in the region the right atrium right ventricle. A ventricular assist device is partially visualized and unchanged when compared to the previous study. An NG tube is seen the appears be curled in the region of the fundus of the stomach. There is mild prominence of the interstitial markings without focal regions of consolidation or focal infiltrates. The osseous structures unremarkable.  IMPRESSION: Support apparatus unchanged. NG tube has been inserted  with tip appearing to curl in the region of the fundus of the stomach. Otherwise no evidence of acute cardiopulmonary disease.   Electronically Signed   By: Margaree Mackintosh M.D.   On: 11/02/2013 08:35   Dg Abd Acute W/chest  11/01/2013   CLINICAL DATA:  Vomiting.  Syncope.  EXAM: ACUTE ABDOMEN SERIES (ABDOMEN 2 VIEW & CHEST 1 VIEW)  COMPARISON:  11/01/2013 chest x-ray.  10/02/2013 chest CT.  FINDINGS: Left ventricular assist device is place. Cardiomegaly. AICD enters from the left with leads unchanged in position. Post median sternotomy.  Consolidation right mid lung. This may represent fluid within the fissure. Follow-up and clearance recommended.  No pulmonary edema or  pneumothorax.  Abnormal bowel gas pattern with gas distended small bowel loops measuring up to 4.4 cm. The findings suggestive of small bowel obstruction without evidence of free intraperitoneal air.  IMPRESSION: Small bowel obstructive pattern.  Consolidation right mid lung. This may represent fluid within the fissure. Follow-up and clearance recommended.  Please see above.   Electronically Signed   By: Chauncey Cruel M.D.   On: 11/01/2013 18:59   Dg Abd Portable 1v  11/02/2013   CLINICAL DATA:  NG tube placement.  EXAM: PORTABLE ABDOMEN - 1 VIEW  COMPARISON:  Abdominal CT from yesterday  FINDINGS: Interval placement of nasogastric tube, coiled in the fundus.  Unchanged appearance of diffusely dilated small bowel. No acute changes noted in the abdomen. Lung bases remain clear. LVAD and ICD/pacer noted.  IMPRESSION: The new nasogastric tube is coiled in the gastric fundus.   Electronically Signed   By: Jorje Guild M.D.   On: 11/02/2013 03:38        Assessment:   1. SBO 2. 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 CAD 4. Hypothyroidism  Plan/Discussion:    Feels better this am but belly still distended and fairly quiet. Nontender though. Continue bowel rest and NGT decompression. Will PICC for TPN. Hopefully SBO will resolve on its own.   Will change meds to IV. Once INR < 2.0 will start heparin.  CHF and VAD parameters stable. Start IV hydralazine for BP control.   Appreciate [facility] and Dr. Osker Mason care.  Length of Stay: 1 Johnny Bickers MD 11/02/2013, 9:54 AM  Advanced Heart Failure Team Pager [phone] (M-F; [place])  Please contact [facility] for night-coverage after hours (4p -7a ) and weekends on [email or web address]

## 2013-11-03 ENCOUNTER — Inpatient Hospital Stay (HOSPITAL_COMMUNITY): Payer: Medicare Other

## 2013-11-03 LAB — CBC WITH DIFFERENTIAL/PLATELET
Basophils Absolute: 0 10*3/uL (ref 0.0–0.1)
Basophils Relative: 0 % (ref 0–1)
Eosinophils Absolute: 0.2 10*3/uL (ref 0.0–0.7)
Eosinophils Relative: 2 % (ref 0–5)
HCT: 34.4 % — ABNORMAL LOW (ref 39.0–52.0)
Hemoglobin: 10.8 g/dL — ABNORMAL LOW (ref 13.0–17.0)
Lymphocytes Relative: 15 % (ref 12–46)
Lymphs Abs: 1.3 10*3/uL (ref 0.7–4.0)
MCH: 26.9 pg (ref 26.0–34.0)
MCHC: 31.4 g/dL (ref 30.0–36.0)
MCV: 85.8 fL (ref 78.0–100.0)
Monocytes Absolute: 0.8 10*3/uL (ref 0.1–1.0)
Monocytes Relative: 9 % (ref 3–12)
Neutro Abs: 6.5 10*3/uL (ref 1.7–7.7)
Neutrophils Relative %: 74 % (ref 43–77)
Platelets: 248 10*3/uL (ref 150–400)
RBC: 4.01 MIL/uL — ABNORMAL LOW (ref 4.22–5.81)
RDW: 20.1 % — ABNORMAL HIGH (ref 11.5–15.5)
WBC: 8.7 10*3/uL (ref 4.0–10.5)

## 2013-11-03 LAB — PHOSPHORUS: Phosphorus: 3.8 mg/dL (ref 2.3–4.6)

## 2013-11-03 LAB — COMPREHENSIVE METABOLIC PANEL
ALT: 24 U/L (ref 0–53)
AST: 35 U/L (ref 0–37)
Albumin: 2.8 g/dL — ABNORMAL LOW (ref 3.5–5.2)
Alkaline Phosphatase: 102 U/L (ref 39–117)
BUN: 20 mg/dL (ref 6–23)
CO2: 25 mEq/L (ref 19–32)
Calcium: 8.7 mg/dL (ref 8.4–10.5)
Chloride: 100 mEq/L (ref 96–112)
Creatinine, Ser: 1.06 mg/dL (ref 0.50–1.35)
GFR calc Af Amer: 80 mL/min — ABNORMAL LOW (ref >90)
GFR calc non Af Amer: 69 mL/min — ABNORMAL LOW (ref >90)
Glucose, Bld: 119 mg/dL — ABNORMAL HIGH (ref 70–99)
Potassium: 4.1 mEq/L (ref 3.7–5.3)
Sodium: 135 mEq/L — ABNORMAL LOW (ref 137–147)
Total Bilirubin: 0.4 mg/dL (ref 0.3–1.2)
Total Protein: 7 g/dL (ref 6.0–8.3)

## 2013-11-03 LAB — TRIGLYCERIDES: Triglycerides: 125 mg/dL (ref <150)

## 2013-11-03 LAB — PREALBUMIN
Prealbumin: 18.5 mg/dL (ref 17.0–34.0)
Prealbumin: 18.2 mg/dL (ref 17.0–34.0)

## 2013-11-03 LAB — GLUCOSE, CAPILLARY
Glucose-Capillary: 114 mg/dL — ABNORMAL HIGH (ref 70–99)
Glucose-Capillary: 114 mg/dL — ABNORMAL HIGH (ref 70–99)
Glucose-Capillary: 124 mg/dL — ABNORMAL HIGH (ref 70–99)
Glucose-Capillary: 108 mg/dL — ABNORMAL HIGH (ref 70–99)

## 2013-11-03 LAB — HEPARIN LEVEL (UNFRACTIONATED): Heparin Unfractionated: 0.41 IU/mL (ref 0.30–0.70)

## 2013-11-03 LAB — PREPARE FRESH FROZEN PLASMA: Unit division: 0

## 2013-11-03 LAB — PROTIME-INR
INR: 2.09 — ABNORMAL HIGH (ref 0.00–1.49)
Prothrombin Time: 22.8 seconds — ABNORMAL HIGH (ref 11.6–15.2)

## 2013-11-03 LAB — MAGNESIUM: Magnesium: 2.1 mg/dL (ref 1.5–2.5)

## 2013-11-03 LAB — T4, FREE: Free T4: 1.4 ng/dL (ref 0.80–1.80)

## 2013-11-03 LAB — T3, FREE: T3, Free: 1.1 pg/mL — ABNORMAL LOW (ref 2.3–4.2)

## 2013-11-03 MED ORDER — LEVOTHYROXINE SODIUM 100 MCG IV SOLR
100.0000 ug | Freq: Every day | INTRAVENOUS | Status: DC
  Administered 2013-11-04 – 2013-11-16 (×13): 100 ug via INTRAVENOUS
  Filled 2013-11-03 (×14): qty 5

## 2013-11-03 MED ORDER — TRACE MINERALS CR-CU-F-FE-I-MN-MO-SE-ZN IV SOLN
INTRAVENOUS | Status: AC
  Administered 2013-11-03: 18:00:00 via INTRAVENOUS
  Filled 2013-11-03: qty 2000

## 2013-11-03 MED ORDER — LEVOTHYROXINE SODIUM 100 MCG IV SOLR
100.0000 ug | Freq: Every day | INTRAVENOUS | Status: DC
  Filled 2013-11-03: qty 5

## 2013-11-03 MED ORDER — HEPARIN (PORCINE) IN NACL 100-0.45 UNIT/ML-% IJ SOLN
1400.0000 [IU]/h | INTRAMUSCULAR | Status: DC
  Administered 2013-11-03 – 2013-11-05 (×2): 1400 [IU]/h via INTRAVENOUS
  Filled 2013-11-03 (×6): qty 250

## 2013-11-03 MED ORDER — FAT EMULSION 20 % IV EMUL
250.0000 mL | INTRAVENOUS | Status: AC
  Administered 2013-11-03: 250 mL via INTRAVENOUS
  Filled 2013-11-03: qty 250

## 2013-11-03 NOTE — Progress Notes (Signed)
  Subjective: Status post LVAD placement for idiopathic cardiomyopathy 8 weeks ago Readmitted with partial small bowel obstruction, vomiting and presyncope from hypovolemia Responding to conservative therapy with NG tube and TPN Abdomen l less distended, bowel sounds now more normal and tenderness resolved KUB shows improved small bowel obstruction Patient is passing gas but no bowel movement Hemodynamic stable, isolated PI events on LVAD record INR pending NG tube output minimal--we'll DC NG tube if okay with general surgery, keep n.p.o. on TPN  Objective: Vital signs in last 24 hours: Temp:  [97.6 F (36.4 C)-98.5 F (36.9 C)] 98 F (36.7 C) (03/06 0355) Pulse Rate:  [35-88] 73 (03/06 0700) Cardiac Rhythm:  [-] Normal sinus rhythm (03/06 0600) Resp:  [11-20] 15 (03/06 0700) SpO2:  [91 %-100 %] 97 % (03/06 0700) Weight:  [156 lb 9.6 oz (71.033 kg)] 156 lb 9.6 oz (71.033 kg) (03/06 0500)  Hemodynamic parameters for last 24 hours:   regular rhythm-sinus Mean arterial pressure 80-90  Intake/Output from previous day: 03/05 0701 - 03/06 0700 In: 1425 [I.V.:410; Blood:165; NG/GT:150; IV Piggyback:50; TPN:650] Out: 1150 [Urine:950; Emesis/NG output:200] Intake/Output this shift: Total I/O In: 70 [I.V.:20; TPN:50] Out: 200 [Urine:200]  Abdomen less distended nontender Extremities warm Lungs clear Normal LVAD humm  Lab Results:  Recent Labs  11/02/13 0355 11/02/13 0500  WBC 8.0 8.5  HGB 12.5* 10.9*  HCT 39.8 34.5*  PLT 243 240   BMET:  Recent Labs  11/02/13 0355 11/02/13 0500  NA 137 137  K 4.3 4.1  CL 102 101  CO2 22 23  GLUCOSE 117* 96  BUN 14 15  CREATININE 1.12 1.21  CALCIUM 8.8 8.6    PT/INR:  Recent Labs  11/03/13 0742  LABPROT 22.8*  INR 2.09*   ABG    Component Value Date/Time   PHART 7.449 09/23/2013 0423   HCO3 24.1* 09/23/2013 0423   TCO2 25.1 09/23/2013 0423   ACIDBASEDEF 1.0 09/21/2013 0349   O2SAT 58.9 10/02/2013 0525   CBG (last 3)    Recent Labs  11/02/13 2319 11/03/13 0605  GLUCAP 114* 114*    Assessment/Plan: S/P   INR down to 2.1, will start IV heparin without bolus and resume Coumadin when taking oral medication Continue ambulation DC NG tube pergeneral surgery  LOS: 2 days    VAN TRIGT III,PETER 11/03/2013

## 2013-11-03 NOTE — Progress Notes (Addendum)
Advanced Heart Failure Team History and Physical Note     Reason for Admission: Vomiting, weakness, hypotension   HPI:    Mr.Cervantez is a 73 year old patient with a history of CAD s/p stent to LCX 2004 with re-occlusion of LCX with lateral MI 2009 and repeat stenting, total occlusion of RCA, ICM, chronic systolic heart failure, hypothyroidism, HLD and arthritis. 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   Admitted to [facility] 09/13/13 through 10/03/13. Initially admitted with decompenstaed heart failure and low output. Required IABP for stabilization. He ultimately had LVAD HM II placed on 09/19/13. He suffered from RV failure after the procedure and required milrinone for >7 days which was gradually weaned to off. During his stay he did develop acute ICU psychosis with delirium which was treated with Zyprexa. Once he was transferred out of the ICU this improved and he returned back to baseline. He was not discharged on beta blocker. D/C weight 151 pounds.   Admitted on 3/4 with SBO. Remains NPO with NGT in. On TPN via CVL. Feeling a bit better. Main complaint is sore throat due to NGT. KUB this am with improving SBO. CXR with RUL opacity - ? PNA   LVAD INTERROGATION:  HeartMate II LVAD: Flow 4.5 liters/min, speed 9400, power 5.5, PI 6.8  No PI   I reviewed the LVAD parameters from today, and compared the results to the patient's prior recorded data. No programming changes were made. The LVAD is functioning within specified parameters. The patient performs LVAD self-test daily. LVAD interrogation was negative for any significant power changes, alarms or PI events/speed drops. LVAD equipment check completed and is in good working order. Back-up equipment present. LVAD education done on emergency procedures and precautions and reviewed exit site care.   Physical Exam: GENERAL: Weak appearing. NAD HEENT: normal  +NGT NECK: Supple, JVP flat . 2+ bilaterally, no bruits. No lymphadenopathy or  thyromegaly appreciated.  CARDIAC: Mechanical heart sounds with LVAD hum present.  LUNGS: Clear to auscultation bilaterally.  ABDOMEN: Soft, mildly distended, nontender, hypoactive bowel sounds LVAD exit site: well-healed and incorporated. Dressing dry and intact. No erythema or drainage. Stabilization device present and accurately applied. Driveline dressing is being changed daily per sterile technique.  EXTREMITIES: Warm and dry, no cyanosis, clubbing, rash or edema  NEUROLOGIC: Alert and oriented x 4. Gait steady. No aphasia. No dysarthria. Affect pleasant.    Telemetry: SR   Labs: Basic Metabolic Panel:  Recent Labs Lab 11/01/13 1033 11/01/13 1812 11/02/13 0355 11/02/13 0500 11/03/13 0742  NA 139 139 137 137 135*  K 4.4 4.3 4.3 4.1 4.1  CL 102 102 102 101 100  CO2 24 23 22 23 25   GLUCOSE 99 116* 117* 96 119*  BUN 16 17 14 15 20   CREATININE 1.37* 1.37* 1.12 1.21 1.06  CALCIUM 8.6 8.8 8.8 8.6 8.7  MG  --   --  2.1 2.0 2.1  PHOS  --   --  3.5 4.0 3.8    Liver Function Tests:  Recent Labs Lab 11/01/13 1033 11/01/13 1812 11/02/13 0355 11/02/13 0500 11/03/13 0742  AST 36 36 31 32 35  ALT 27 28 23 22 24   ALKPHOS 134* 134* 118* 101 102  BILITOT 0.5 0.6 0.6 0.7 0.4  PROT 7.7 7.6 7.3 6.8 7.0  ALBUMIN 3.1* 3.3* 3.0* 2.9* 2.8*    Recent Labs Lab 11/01/13 1812  LIPASE 39   No results found for this basename: AMMONIA,  in the last 168 hours  CBC:  Recent Labs Lab 11/01/13 1033 11/01/13 1812 11/02/13 0355 11/02/13 0500 11/03/13 0742  WBC 7.3 10.7* 8.0 8.5 8.7  NEUTROABS  --  8.8*  --   --  6.5  HGB 12.3* 12.5* 12.5* 10.9* 10.8*  HCT 38.4* 38.7* 39.8 34.5* 34.4*  MCV 85.3 85.6 85.8 86.5 85.8  PLT 251 234 243 240 248    Cardiac Enzymes:  Recent Labs Lab 11/01/13 1812  TROPONINI <0.30    BNP: BNP (last 3 results)  Recent Labs  09/13/13 1349 09/25/13 0400 11/01/13 1033  PROBNP 3883.0* 7019.0* 877.6*    CBG:  Recent Labs Lab  11/02/13 2319 11/03/13 0605 11/03/13 1138  GLUCAP 114* 114* 124*    Coagulation Studies:  Recent Labs  11/01/13 1033 11/01/13 1812 11/02/13 0355 11/02/13 1600 11/03/13 0742  LABPROT 35.6* 34.3* 31.2* 23.5* 22.8*  INR 3.74* 3.56* 3.15* 2.17* 2.09*    Other results:   Imaging: Dg Chest 2 View  11/01/2013   CLINICAL DATA:  Fall.  LVAD  EXAM: CHEST  2 VIEW  COMPARISON:  09/28/2013  FINDINGS: There is a left chest wall ICD with lead in the right ventricle and right atrial appendage. The left ventricular assist device is identified and appears unchanged in position from previous exam. Stable appearance of left upper lobe perihilar opacity and pleural thickening of the major fissure. No displaced rib fractures noted.  IMPRESSION: 1. No acute findings and no significant change from 10/02/2013   Electronically Signed   By: Kerby Moors M.D.   On: 11/01/2013 16:34   Ct Abdomen Pelvis W Contrast  11/02/2013   CLINICAL DATA:  Vomiting.  Lethargic.  EXAM: CT ABDOMEN AND PELVIS WITH CONTRAST  TECHNIQUE: Multidetector CT imaging of the abdomen and pelvis was performed using the standard protocol following bolus administration of intravenous contrast.  CONTRAST:  171mL OMNIPAQUE IOHEXOL 300 MG/ML  SOLN  COMPARISON:  11/01/2013 plain film exam.  09/13/2013 CT.  FINDINGS: Left ventricular assist device is in place causes streak artifact. Cardiomegaly. Pleural effusions. Basilar atelectasis.  Dilated fluid and gas-filled stomach. Contrast extends into nondilated duodenum. Beyond this region, fluid and gas-filled dilated loops of small bowel consistent with small bowel obstruction. Cause indeterminate. This may be related to adhesions. Fluid within the pelvis most likely is related to the small bowel obstruction. No free intraperitoneal air. No inflammation surrounds the appendix or sigmoid diverticula.  Taking into account limitation by streak artifact from left ventricular assist device, no worrisome  hepatic, splenic, pancreatic, adrenal or renal lesion is noted. There are sub cm low-density structures within the inferior aspect the left kidney which may be a cyst although too small to characterize.  Atherosclerotic type changes of the abdominal aorta most notable at the aortic bifurcation without high-grade stenosis. Mild ectasia proximal left common iliac artery.  Decompressed urinary bladder. Slightly heterogeneous appearance of the prostate gland. Clinical and laboratory correlation recommended.  Degenerative changes lumbar spine without bony destructive lesion.  IMPRESSION: Small bowel obstructive pattern as detailed above.  These results were called by telephone at the time of interpretation on 11/02/2013 at 1:06 AM to [facility] patients nurse, who verbally acknowledged these results.   Electronically Signed   By: Chauncey Cruel M.D.   On: 11/02/2013 01:07   Dg Chest Port 1 View  11/03/2013   CLINICAL DATA:  Ventricular assistance device.  EXAM: PORTABLE CHEST - 1 VIEW  COMPARISON:  November 02, 2013.  FINDINGS: Stable cardiomediastinal silhouette. Sternotomy wires and left-sided pacemaker are  noted. Right subclavian catheter line is unchanged in position. No pneumothorax is noted. Ventricular assistance device is unchanged in position. Increased right upper lobe opacity is noted concerning for developing pneumonia.  IMPRESSION: Stable support apparatus. Increased right upper lobe opacity is noted concerning for developing pneumonia.   Electronically Signed   By: Sabino Dick M.D.   On: 11/03/2013 08:09   Dg Chest Port 1 View  11/02/2013   CLINICAL DATA:  Central line placement  EXAM: PORTABLE CHEST - 1 VIEW  COMPARISON:  DG CHEST 1V PORT dated 11/02/2013  FINDINGS: Right subclavian vein central venous catheter placed. Tip is at the cavoatrial junction. No pneumothorax. Ventricular assist device stable. Tubular structures stable otherwise. Left subclavian AICD stable. Lungs are stable in appearance.  IMPRESSION:  Right subclavian vein central venous catheter place with its tip at the cavoatrial junction and no pneumothorax.   Electronically Signed   By: Maryclare Bean M.D.   On: 11/02/2013 16:44   Dg Chest Port 1 View  11/02/2013   CLINICAL DATA:  VAD  EXAM: PORTABLE CHEST - 1 VIEW  COMPARISON:  DG ABD ACUTE W/CHEST dated 11/01/2013  FINDINGS: The patient is tilted to the left. Cardiac silhouette is enlarged. A PEG status post median sternotomy. A left chest wall dual chamber AICD is appreciated lead tips project in the region the right atrium right ventricle. A ventricular assist device is partially visualized and unchanged when compared to the previous study. An NG tube is seen the appears be curled in the region of the fundus of the stomach. There is mild prominence of the interstitial markings without focal regions of consolidation or focal infiltrates. The osseous structures unremarkable.  IMPRESSION: Support apparatus unchanged. NG tube has been inserted with tip appearing to curl in the region of the fundus of the stomach. Otherwise no evidence of acute cardiopulmonary disease.   Electronically Signed   By: Margaree Mackintosh M.D.   On: 11/02/2013 08:35   Dg Abd Acute W/chest  11/01/2013   CLINICAL DATA:  Vomiting.  Syncope.  EXAM: ACUTE ABDOMEN SERIES (ABDOMEN 2 VIEW & CHEST 1 VIEW)  COMPARISON:  11/01/2013 chest x-ray.  10/02/2013 chest CT.  FINDINGS: Left ventricular assist device is place. Cardiomegaly. AICD enters from the left with leads unchanged in position. Post median sternotomy.  Consolidation right mid lung. This may represent fluid within the fissure. Follow-up and clearance recommended.  No pulmonary edema or pneumothorax.  Abnormal bowel gas pattern with gas distended small bowel loops measuring up to 4.4 cm. The findings suggestive of small bowel obstruction without evidence of free intraperitoneal air.  IMPRESSION: Small bowel obstructive pattern.  Consolidation right mid lung. This may represent fluid  within the fissure. Follow-up and clearance recommended.  Please see above.   Electronically Signed   By: Chauncey Cruel M.D.   On: 11/01/2013 18:59   Dg Abd Portable 1v  11/03/2013   CLINICAL DATA:  Small bowel obstruction.  EXAM: PORTABLE ABDOMEN - 1 VIEW  COMPARISON:  Single view of the abdomen 11/02/2013 and two views of the abdomen 11/01/2013.  FINDINGS: Although gaseous distention of small bowel persists, the appearance is improved. There is some gas and stool in the colon which appears increased. No new abnormality.  IMPRESSION: Improved small bowel obstruction.   Electronically Signed   By: Inge Rise M.D.   On: 11/03/2013 08:25   Dg Abd Portable 1v  11/02/2013   CLINICAL DATA:  NG tube placement.  EXAM: PORTABLE ABDOMEN -  1 VIEW  COMPARISON:  Abdominal CT from yesterday  FINDINGS: Interval placement of nasogastric tube, coiled in the fundus.  Unchanged appearance of diffusely dilated small bowel. No acute changes noted in the abdomen. Lung bases remain clear. LVAD and ICD/pacer noted.  IMPRESSION: The new nasogastric tube is coiled in the gastric fundus.   Electronically Signed   By: Jorje Guild M.D.   On: 11/02/2013 03:38        Assessment:   1. SBO 2. 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 CAD 4. Hypothyroidism  Plan/Discussion:    SBO seems to be resolving slowly. NGT is clamped. Will see how he does. Continue TPN for now.   INR 2.09. Heparin started.   TSH high Free T3 is low. T4 ok. Will increase IV synthroid. Will also increase outpatient dose to 128mcg.   CHF and VAD parameters stable. Start IV hydralazine for BP control.   On ceftriaxone for skin wound. Should cover ?RUL infiltrate. Will repeat CXR tomorrow. Currently asymptomatic. If develops symptoms can broaden coverage.   Appreciate [facility] and Dr. Osker Mason care.  Length of Stay: 2 Glori Bickers MD 11/03/2013, 12:57 PM  Advanced Heart Failure Team Pager [phone] (M-F; 7a - 4p)  Please contact [facility] for night-coverage after hours (4p -7a ) and weekends on [email or web address]

## 2013-11-03 NOTE — Progress Notes (Signed)
Patient interviewed and examined, agree with PA note above. Improving SBO  Edward Jolly MD, FACS  11/03/2013 6:09 PM

## 2013-11-03 NOTE — Progress Notes (Addendum)
ANTICOAGULATION CONSULT NOTE - Initial Consult  Pharmacy Consult for warfarin>>heparin when INR<2 Indication: LVAD  Allergies  Allergen Reactions  . Diovan [Valsartan] Other (See Comments)    Hypotension at low dose, hyperkalemia  . Lipitor [Atorvastatin Calcium] Other (See Comments)    Muscle pain    Patient Measurements: Height: 5\' 9"  (175.3 cm) Weight: 156 lb 9.6 oz (71.033 kg) IBW/kg (Calculated) : 70.7   Vital Signs: Temp: 98 F (36.7 C) (03/06 0355) Temp src: Oral (03/06 0355) Pulse Rate: 73 (03/06 0700)  Labs:  Recent Labs  11/01/13 1812 11/02/13 0355 11/02/13 0500 11/02/13 1600  HGB 12.5* 12.5* 10.9*  --   HCT 38.7* 39.8 34.5*  --   PLT 234 243 240  --   LABPROT 34.3* 31.2*  --  23.5*  INR 3.56* 3.15*  --  2.17*  CREATININE 1.37* 1.12 1.21  --   TROPONINI <0.30  --   --   --     Estimated Creatinine Clearance: 56.8 ml/min (by C-G formula based on Cr of 1.21).   Medical History: Past Medical History  Diagnosis Date  . Ischemic cardiomyopathy     a. 10/09 Echo: Sev LV Dysfxn, inf/lat AK, Mod MR.  [name];  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 Hypertension   . PVC (premature ventricular contraction)   . Pacemaker   . CHF (congestive heart failure)   . Shortness of breath   . Automatic implantable cardioverter-defibrillator in situ    Assessment: 5 YOM with LVAD 09/19/2013 seen in HF clinic and had syncopal episode- given fluids and labs and ECHO were ok. On the way home, patient vomited and was lethargic and was brought back to the ED for admission found to have SBO.   Patient had elevated INR in clinic of  3.7 but that has since dropped to 2.1 this morning after holding warfarin. With surgery possible in near future we are holding warfarin for now. Given rapid drop in INR overnight I expect the INR to drop to less than 2 later today, will discuss with HF team but plan on starting heparin infusion this afternoon. TPN was started yesterday for intermittant nutrition.  Looking at recent heparin history he was just below goal on 1600 units/hr, will start a little lower given elevated INR  Goal of Therapy:  INR 2-3  Monitor platelets by anticoagulation protocol: Yes   Plan:  1. Continue to hold warfarin, start heparin without bolus at 1400 units/hr 2. Daily PT/INR/HL/CBC 3. Follow for s/s bleeding  Erin Hearing PharmD., BCPS Clinical Pharmacist Pager [phone] 11/03/2013 7:53 AM  Addendum:  Heparin level = 0,41 at goal. No bleeding noted per chart.  - Continue heparin infusion at current rate - f/u AM heparin level and CBC  Maryanna Shape, PharmD, BCPS  Clinical Pharmacist  Pager: [phone]

## 2013-11-03 NOTE — Progress Notes (Signed)
PARENTERAL NUTRITION CONSULT NOTE:  FOLLOW-UP  Pharmacy Consult:  TPN Indication: SBO  Allergies  Allergen Reactions  . Diovan [Valsartan] Other (See Comments)    Hypotension at low dose, hyperkalemia  . Lipitor [Atorvastatin Calcium] Other (See Comments)    Muscle pain    Patient Measurements: Height: 5\' 9"  (175.3 cm) Weight: 156 lb 9.6 oz (71.033 kg) IBW/kg (Calculated) : 70.7 Usual Weight: 68 kg  Vital Signs: Temp: 98 F (36.7 C) (03/06 0355) Temp src: Oral (03/06 0355) Pulse Rate: 73 (03/06 0700) Intake/Output from previous day: 03/05 0701 - 03/06 0700 In: 1425 [I.V.:410; Blood:165; NG/GT:150; IV Piggyback:50; TPN:650] Out: 1150 [Urine:950; Emesis/NG output:200]  Labs:  Recent Labs  11/01/13 1812 11/02/13 0355 11/02/13 0500 11/02/13 1600  WBC 10.7* 8.0 8.5  --   HGB 12.5* 12.5* 10.9*  --   HCT 38.7* 39.8 34.5*  --   PLT 234 243 240  --   INR 3.56* 3.15*  --  2.17*     Recent Labs  11/01/13 1812 11/02/13 0355 11/02/13 0500 11/03/13 0500  NA 139 137 137  --   K 4.3 4.3 4.1  --   CL 102 102 101  --   CO2 23 22 23   --   GLUCOSE 116* 117* 96  --   BUN 17 14 15   --   CREATININE 1.37* 1.12 1.21  --   CALCIUM 8.8 8.8 8.6  --   MG  --  2.1 2.0  --   PHOS  --  3.5 4.0  --   PROT 7.6 7.3 6.8  --   ALBUMIN 3.3* 3.0* 2.9*  --   AST 36 31 32  --   ALT 28 23 22   --   ALKPHOS 134* 118* 101  --   BILITOT 0.6 0.6 0.7  --   PREALBUMIN  --   --  18.5  --   TRIG  --   --   --  125   Estimated Creatinine Clearance: 56.8 ml/min (by C-G formula based on Cr of 1.21).    Recent Labs  11/02/13 2319 11/03/13 0605  GLUCAP 114* 114*     Insulin Requirements in the past 24 hours:  None  Assessment: 51 YOM with history of recent LVAD and discharged on 09/19/13.  Patient returned with N/V and CT scan revealed SBO.  Pharmacy consulted to manage TPN.  GI: distended abdomen on bowel rest with NGT. NGT O/P decreasing - clamping trial and may d/c today.  Had flatus  but no BM since admit.  Prealbumin 18.5 on admit, TG WNL.  PPI IV Endo: hypothyroid on Synthroid.  No hx DM - CBGs controlled Lytes: mild hyponatremia, others WNL Renal: SCr down 1.06, CrCl 65 ml/min, decent UOP, NS at [place]: CAD s/p stent to LCX 2004 with re-occlusion of LCX and lateral MI 2009 and repeat stenting, total occlusion of RCA, ICM, chronic sHF, s/p LVAD 09/19/13 - VSS - on hydralazine Hepatobil: LFTs/tbili WNL ID: Rocephin D#2 -?reason why - was to start cephalexin 3/4 x 5 days as o/p. Afebrile, WBC WNL. Anticoag: Coumadin PTA for LVAD. INR 3.5 upon admit - Coumadin held 3/4, s/p FFP x 1 on 3/5 so that PICC line can be placed. Plan for IV heparin when INR < 2, currently at 2.09 Best Practices: PPI IV, Coumadin, heparin TPN Access: CVC placed 11/02/13 TPN day#: 1 (3/5 >> )  Current Nutrition:  TPN  Nutritional Goals:  2050 - 2250 kCal and 105 -  115 gm protein daily   Plan:  - Increase Clinimix E 5/15 to 83 ml/hr and continue lipids at 40ml/hr.  TPN will provide 1894 kCal and 100gm protein daily, meeting > 90% of patient's needs.  No plan to meet 100% of needs as expect TPN to be d/c'ed rather soon. - Daily IV multivitamin in TPN - Trace elements on M/W/F due to national shortage - F/U daily    Gwynne Kemnitz D. Mina Marble, PharmD, BCPS Pager:  [phone] 11/03/2013, 9:55 AM

## 2013-11-03 NOTE — Progress Notes (Signed)
Patient ID: Johnny Oneill, male   DOB: 04/16/1941, 73 y.o.   MRN: [id number]    Subjective: Pt feels better today.  Passing lots of flatus.  Wants NGT out  Objective: Vital signs in last 24 hours: Temp:  [97.6 F (36.4 C)-98.5 F (36.9 C)] 98 F (36.7 C) (03/06 0355) Pulse Rate:  [35-88] 73 (03/06 0700) Resp:  [11-20] 15 (03/06 0700) SpO2:  [91 %-100 %] 97 % (03/06 0700) Weight:  [156 lb 9.6 oz (71.033 kg)] 156 lb 9.6 oz (71.033 kg) (03/06 0500)    Intake/Output from previous day: 03/05 0701 - 03/06 0700 In: 1425 [I.V.:410; Blood:165; NG/GT:150; IV Piggyback:50; TPN:650] Out: 1150 [Urine:950; Emesis/NG output:200] Intake/Output this shift: Total I/O In: 70 [I.V.:20; TPN:50] Out: 200 [Urine:200]  PE: Abd: soft, +BS, NT, hum of VAD noted.  NGT with no bilious output  Lab Results:   Recent Labs  11/02/13 0355 11/02/13 0500  WBC 8.0 8.5  HGB 12.5* 10.9*  HCT 39.8 34.5*  PLT 243 240   BMET  Recent Labs  11/02/13 0355 11/02/13 0500  NA 137 137  K 4.3 4.1  CL 102 101  CO2 22 23  GLUCOSE 117* 96  BUN 14 15  CREATININE 1.12 1.21  CALCIUM 8.8 8.6   PT/INR  Recent Labs  11/02/13 1600 11/03/13 0742  LABPROT 23.5* 22.8*  INR 2.17* 2.09*   CMP     Component Value Date/Time   NA 137 11/02/2013 0500   K 4.1 11/02/2013 0500   CL 101 11/02/2013 0500   CO2 23 11/02/2013 0500   GLUCOSE 96 11/02/2013 0500   BUN 15 11/02/2013 0500   BUN 21 05/19/2010   CREATININE 1.21 11/02/2013 0500   CALCIUM 8.6 11/02/2013 0500   PROT 6.8 11/02/2013 0500   ALBUMIN 2.9* 11/02/2013 0500   AST 32 11/02/2013 0500   ALT 22 11/02/2013 0500   ALKPHOS 101 11/02/2013 0500   BILITOT 0.7 11/02/2013 0500   GFRNONAA 59* 11/02/2013 0500   GFRAA 68* 11/02/2013 0500   Lipase     Component Value Date/Time   LIPASE 39 11/01/2013 1812       Studies/Results: Dg Chest 2 View  11/01/2013   CLINICAL DATA:  Fall.  LVAD  EXAM: CHEST  2 VIEW  COMPARISON:  09/28/2013  FINDINGS: There is a left chest wall ICD with lead in  the right ventricle and right atrial appendage. The left ventricular assist device is identified and appears unchanged in position from previous exam. Stable appearance of left upper lobe perihilar opacity and pleural thickening of the major fissure. No displaced rib fractures noted.  IMPRESSION: 1. No acute findings and no significant change from 10/02/2013   Electronically Signed   By: Kerby Moors M.D.   On: 11/01/2013 16:34   Ct Abdomen Pelvis W Contrast  11/02/2013   CLINICAL DATA:  Vomiting.  Lethargic.  EXAM: CT ABDOMEN AND PELVIS WITH CONTRAST  TECHNIQUE: Multidetector CT imaging of the abdomen and pelvis was performed using the standard protocol following bolus administration of intravenous contrast.  CONTRAST:  135mL OMNIPAQUE IOHEXOL 300 MG/ML  SOLN  COMPARISON:  11/01/2013 plain film exam.  09/13/2013 CT.  FINDINGS: Left ventricular assist device is in place causes streak artifact. Cardiomegaly. Pleural effusions. Basilar atelectasis.  Dilated fluid and gas-filled stomach. Contrast extends into nondilated duodenum. Beyond this region, fluid and gas-filled dilated loops of small bowel consistent with small bowel obstruction. Cause indeterminate. This may be related to adhesions. Fluid within the  pelvis most likely is related to the small bowel obstruction. No free intraperitoneal air. No inflammation surrounds the appendix or sigmoid diverticula.  Taking into account limitation by streak artifact from left ventricular assist device, no worrisome hepatic, splenic, pancreatic, adrenal or renal lesion is noted. There are sub cm low-density structures within the inferior aspect the left kidney which may be a cyst although too small to characterize.  Atherosclerotic type changes of the abdominal aorta most notable at the aortic bifurcation without high-grade stenosis. Mild ectasia proximal left common iliac artery.  Decompressed urinary bladder. Slightly heterogeneous appearance of the prostate gland.  Clinical and laboratory correlation recommended.  Degenerative changes lumbar spine without bony destructive lesion.  IMPRESSION: Small bowel obstructive pattern as detailed above.  These results were called by telephone at the time of interpretation on 11/02/2013 at 1:06 AM to [facility] patients nurse, who verbally acknowledged these results.   Electronically Signed   By: Chauncey Cruel M.D.   On: 11/02/2013 01:07   Dg Chest Port 1 View  11/03/2013   CLINICAL DATA:  Ventricular assistance device.  EXAM: PORTABLE CHEST - 1 VIEW  COMPARISON:  November 02, 2013.  FINDINGS: Stable cardiomediastinal silhouette. Sternotomy wires and left-sided pacemaker are noted. Right subclavian catheter line is unchanged in position. No pneumothorax is noted. Ventricular assistance device is unchanged in position. Increased right upper lobe opacity is noted concerning for developing pneumonia.  IMPRESSION: Stable support apparatus. Increased right upper lobe opacity is noted concerning for developing pneumonia.   Electronically Signed   By: Sabino Dick M.D.   On: 11/03/2013 08:09   Dg Chest Port 1 View  11/02/2013   CLINICAL DATA:  Central line placement  EXAM: PORTABLE CHEST - 1 VIEW  COMPARISON:  DG CHEST 1V PORT dated 11/02/2013  FINDINGS: Right subclavian vein central venous catheter placed. Tip is at the cavoatrial junction. No pneumothorax. Ventricular assist device stable. Tubular structures stable otherwise. Left subclavian AICD stable. Lungs are stable in appearance.  IMPRESSION: Right subclavian vein central venous catheter place with its tip at the cavoatrial junction and no pneumothorax.   Electronically Signed   By: Maryclare Bean M.D.   On: 11/02/2013 16:44   Dg Chest Port 1 View  11/02/2013   CLINICAL DATA:  VAD  EXAM: PORTABLE CHEST - 1 VIEW  COMPARISON:  DG ABD ACUTE W/CHEST dated 11/01/2013  FINDINGS: The patient is tilted to the left. Cardiac silhouette is enlarged. A PEG status post median sternotomy. A left chest wall dual  chamber AICD is appreciated lead tips project in the region the right atrium right ventricle. A ventricular assist device is partially visualized and unchanged when compared to the previous study. An NG tube is seen the appears be curled in the region of the fundus of the stomach. There is mild prominence of the interstitial markings without focal regions of consolidation or focal infiltrates. The osseous structures unremarkable.  IMPRESSION: Support apparatus unchanged. NG tube has been inserted with tip appearing to curl in the region of the fundus of the stomach. Otherwise no evidence of acute cardiopulmonary disease.   Electronically Signed   By: Margaree Mackintosh M.D.   On: 11/02/2013 08:35   Dg Abd Acute W/chest  11/01/2013   CLINICAL DATA:  Vomiting.  Syncope.  EXAM: ACUTE ABDOMEN SERIES (ABDOMEN 2 VIEW & CHEST 1 VIEW)  COMPARISON:  11/01/2013 chest x-ray.  10/02/2013 chest CT.  FINDINGS: Left ventricular assist device is place. Cardiomegaly. AICD enters from the left with  leads unchanged in position. Post median sternotomy.  Consolidation right mid lung. This may represent fluid within the fissure. Follow-up and clearance recommended.  No pulmonary edema or pneumothorax.  Abnormal bowel gas pattern with gas distended small bowel loops measuring up to 4.4 cm. The findings suggestive of small bowel obstruction without evidence of free intraperitoneal air.  IMPRESSION: Small bowel obstructive pattern.  Consolidation right mid lung. This may represent fluid within the fissure. Follow-up and clearance recommended.  Please see above.   Electronically Signed   By: Chauncey Cruel M.D.   On: 11/01/2013 18:59   Dg Abd Portable 1v  11/02/2013   CLINICAL DATA:  NG tube placement.  EXAM: PORTABLE ABDOMEN - 1 VIEW  COMPARISON:  Abdominal CT from yesterday  FINDINGS: Interval placement of nasogastric tube, coiled in the fundus.  Unchanged appearance of diffusely dilated small bowel. No acute changes noted in the abdomen.  Lung bases remain clear. LVAD and ICD/pacer noted.  IMPRESSION: The new nasogastric tube is coiled in the gastric fundus.   Electronically Signed   By: Jorje Guild M.D.   On: 11/02/2013 03:38    Anti-infectives: Anti-infectives   Start     Dose/Rate Route Frequency Ordered Stop   11/02/13 0130  cefTRIAXone (ROCEPHIN) 1 g in dextrose 5 % 50 mL IVPB     1 g 100 mL/hr over 30 Minutes Intravenous Every 24 hours 11/02/13 0121     11/01/13 2200  cephALEXin (KEFLEX) capsule 500 mg  Status:  Discontinued     500 mg Oral 2 times daily 11/01/13 2157 11/02/13 0121       Assessment/Plan  1. SBO, improving 2. S/p VAD placement  Plan: 1.  Patient's films show contrast in his colon and air in his colon.  He is passing lots of flatus, no BM yet.  He is having no bilious output from his NGT.  I will clamp his NGT today for 6 hours and if no nausea or bloating then will dc NGT later today.  LOS: 2 days    Vani Gunner E 11/03/2013, 8:14 AM Pager: [phone]

## 2013-11-04 ENCOUNTER — Inpatient Hospital Stay (HOSPITAL_COMMUNITY): Payer: Medicare Other

## 2013-11-04 DIAGNOSIS — K560 Paralytic ileus: Secondary | ICD-10-CM

## 2013-11-04 LAB — GLUCOSE, CAPILLARY
Glucose-Capillary: 112 mg/dL — ABNORMAL HIGH (ref 70–99)
Glucose-Capillary: 113 mg/dL — ABNORMAL HIGH (ref 70–99)
Glucose-Capillary: 118 mg/dL — ABNORMAL HIGH (ref 70–99)
Glucose-Capillary: 123 mg/dL — ABNORMAL HIGH (ref 70–99)

## 2013-11-04 LAB — COMPREHENSIVE METABOLIC PANEL
ALT: 20 U/L (ref 0–53)
AST: 28 U/L (ref 0–37)
Albumin: 2.7 g/dL — ABNORMAL LOW (ref 3.5–5.2)
Alkaline Phosphatase: 91 U/L (ref 39–117)
BUN: 21 mg/dL (ref 6–23)
CO2: 24 mEq/L (ref 19–32)
Calcium: 8.4 mg/dL (ref 8.4–10.5)
Chloride: 101 mEq/L (ref 96–112)
Creatinine, Ser: 1.05 mg/dL (ref 0.50–1.35)
GFR calc Af Amer: 81 mL/min — ABNORMAL LOW (ref >90)
GFR calc non Af Amer: 70 mL/min — ABNORMAL LOW (ref >90)
Glucose, Bld: 121 mg/dL — ABNORMAL HIGH (ref 70–99)
Potassium: 4.3 mEq/L (ref 3.7–5.3)
Sodium: 136 mEq/L — ABNORMAL LOW (ref 137–147)
Total Bilirubin: 0.4 mg/dL (ref 0.3–1.2)
Total Protein: 6.5 g/dL (ref 6.0–8.3)

## 2013-11-04 LAB — CBC
HCT: 31.8 % — ABNORMAL LOW (ref 39.0–52.0)
Hemoglobin: 10.1 g/dL — ABNORMAL LOW (ref 13.0–17.0)
MCH: 27.1 pg (ref 26.0–34.0)
MCHC: 31.8 g/dL (ref 30.0–36.0)
MCV: 85.3 fL (ref 78.0–100.0)
Platelets: 207 10*3/uL (ref 150–400)
RBC: 3.73 MIL/uL — ABNORMAL LOW (ref 4.22–5.81)
RDW: 20 % — ABNORMAL HIGH (ref 11.5–15.5)
WBC: 7.6 10*3/uL (ref 4.0–10.5)

## 2013-11-04 LAB — PROTIME-INR
INR: 1.67 — ABNORMAL HIGH (ref 0.00–1.49)
Prothrombin Time: 19.2 seconds — ABNORMAL HIGH (ref 11.6–15.2)

## 2013-11-04 LAB — HEPARIN LEVEL (UNFRACTIONATED): Heparin Unfractionated: 0.39 IU/mL (ref 0.30–0.70)

## 2013-11-04 MED ORDER — M.V.I. ADULT IV INJ
INTRAVENOUS | Status: AC
  Administered 2013-11-04: 18:00:00 via INTRAVENOUS
  Filled 2013-11-04: qty 2000

## 2013-11-04 MED ORDER — WARFARIN SODIUM 5 MG PO TABS
5.0000 mg | ORAL_TABLET | Freq: Once | ORAL | Status: AC
  Administered 2013-11-04: 5 mg via ORAL
  Filled 2013-11-04: qty 1

## 2013-11-04 MED ORDER — FAT EMULSION 20 % IV EMUL
250.0000 mL | INTRAVENOUS | Status: AC
  Administered 2013-11-04: 250 mL via INTRAVENOUS
  Filled 2013-11-04: qty 250

## 2013-11-04 MED ORDER — WARFARIN - PHARMACIST DOSING INPATIENT
Freq: Every day | Status: DC
  Administered 2013-11-04 – 2013-11-05 (×2)

## 2013-11-04 NOTE — Progress Notes (Addendum)
ANTICOAGULATION CONSULT NOTE - Follow up  Pharmacy Consult for warfarin>>heparin when INR<2 Indication: LVAD  Allergies  Allergen Reactions  . Diovan [Valsartan] Other (See Comments)    Hypotension at low dose, hyperkalemia  . Lipitor [Atorvastatin Calcium] Other (See Comments)    Muscle pain    Patient Measurements: Height: 5\' 7"  (170.2 cm) Weight: 156 lb 4.9 oz (70.9 kg) IBW/kg (Calculated) : 66.1   Vital Signs: Temp: 97.4 F (36.3 C) (03/07 0748) Temp src: Oral (03/07 0748) Pulse Rate: 73 (03/07 0800)  Labs:  Recent Labs  11/01/13 1812  11/02/13 0500 11/02/13 1600 11/03/13 0742 11/03/13 1926 11/04/13 0255  HGB 12.5*  < > 10.9*  --  10.8*  --  10.1*  HCT 38.7*  < > 34.5*  --  34.4*  --  31.8*  PLT 234  < > 240  --  248  --  207  LABPROT 34.3*  < >  --  23.5* 22.8*  --  19.2*  INR 3.56*  < >  --  2.17* 2.09*  --  1.67*  HEPARINUNFRC  --   --   --   --   --  0.41 0.39  CREATININE 1.37*  < > 1.21  --  1.06  --  1.05  TROPONINI <0.30  --   --   --   --   --   --   < > = values in this interval not displayed.  Estimated Creatinine Clearance: 61.2 ml/min (by C-G formula based on Cr of 1.05).   Medical History: Past Medical History  Diagnosis Date  . Ischemic cardiomyopathy     a. 10/09 Echo: Sev LV Dysfxn, inf/lat AK, Mod MR.  [name];  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 Hypertension   . PVC (premature ventricular contraction)   . Pacemaker   . CHF (congestive heart failure)   . Shortness of breath   . Automatic implantable cardioverter-defibrillator in situ    Assessment: 66 YOM with LVAD admitted 11/01/2013 with  SBO. Pharmacy consulted to start heparin while off warfarin.  HPI: 09/19/2013 seen in HF clinic and had syncopal episode- given fluids and labs and ECHO were ok. On the way home, patient vomited and was lethargic and was brought back to the ED for admission found to have SBO.    Coag: LVAD:  Heparin level at goal, no bleeding noted Warfarin: PTA: 5 daily 2.5 2d/wk, INR 3.7 on this.    GI: SBO, on  TPN was started 3/5  Infectious Disease: superficial infection around driveline site  keflex as outpt,  3/7 CTX>  no fevers, wbc stable   Cardiovascular:HF with ef 15%, s/p lvad, maps 90-100, holding meds d/t being npo, added hydralazine to keep maps <100  Endocrinology:tsh elevated, T3 low- on home dose of synthroid  Nephrology: CrCl 60s lytes ok, no diuretics  PTA Medication Issues:home meds on hold as he is npo  Goal of Therapy:  INR 2-3  Monitor platelets by anticoagulation protocol: Yes Heparin level 0.3-0.5  Plan:  1. Continueheparin without bolus at 1400 units/hr 2. Daily PT/INR/HL/CBC 3. Follow for s/s bleeding 4. Follow up about plan to resume warfarin.  Thank you for allowing pharmacy to be a part of this patients care team.  Rowe Robert Pharm.D., BCPS Clinical Pharmacist 11/04/2013 10:11 AM Pager: [phone] Phone: [phone]  1:50 PM  Spoke with Dr. Haroldine Laws re starting warfarin as now ok per Dr Murlean Caller note.  Order received to begin.  Will give 5 mg warfarin tonight.  Daily INR.   [place]

## 2013-11-04 NOTE — Progress Notes (Addendum)
Advanced Heart Failure Team History and Physical Note     Reason for Admission: Vomiting, weakness, hypotension   HPI:    [name] is a 73 year old patient with a history of CAD s/p stent to LCX 2004 with re-occlusion of LCX with lateral MI 2009 and repeat stenting, total occlusion of RCA, ICM, chronic systolic heart failure, hypothyroidism, HLD and arthritis. 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   Admitted to [facility] 09/13/13 through 10/03/13. Initially admitted with decompenstaed heart failure and low output. Required IABP for stabilization. He ultimately had LVAD HM II placed on 09/19/13. He suffered from RV failure after the procedure and required milrinone for >7 days which was gradually weaned to off. During his stay he did develop acute ICU psychosis with delirium which was treated with Zyprexa. Once he was transferred out of the ICU this improved and he returned back to baseline. He was not discharged on beta blocker. D/C weight 151 pounds.   Admitted on 3/4 with SBO.  NGT removed yesterday. Feeling better. Had several BMs over night. Remains NPO on TPN.  MAPs 80s. INR 1.67. On heparin,  LVAD INTERROGATION:  HeartMate II LVAD: Flow 5.4 liters/min, speed 9400, power 5.5, PI 6.8  No PI   I reviewed the LVAD parameters from today, and compared the results to the patient's prior recorded data. No programming changes were made. The LVAD is functioning within specified parameters. The patient performs LVAD self-test daily. LVAD interrogation was negative for any significant power changes, alarms or PI events/speed drops. LVAD equipment check completed and is in good working order. Back-up equipment present. LVAD education done on emergency procedures and precautions and reviewed exit site care.   Physical Exam: GENERAL: looks good  NAD HEENT: normal   NECK: Supple, JVP flat . 2+ bilaterally, no bruits. No lymphadenopathy or thyromegaly appreciated.  CARDIAC: Mechanical heart  sounds with LVAD hum present.  LUNGS: Clear to auscultation bilaterally.  ABDOMEN: Soft, nondistended, nontender, hypoactive bowel sounds but improving LVAD exit site: well-healed and incorporated. Dressing dry and intact. No erythema or drainage. Stabilization device present and accurately applied. Driveline dressing is being changed daily per sterile technique.  EXTREMITIES: Warm and dry, no cyanosis, clubbing, rash or edema  NEUROLOGIC: Alert and oriented x 4. Gait steady. No aphasia. No dysarthria. Affect pleasant.    Telemetry: SR   Labs: Basic Metabolic Panel:  Recent Labs Lab 11/01/13 1812 11/02/13 0355 11/02/13 0500 11/03/13 0742 11/04/13 0255  NA 139 137 137 135* 136*  K 4.3 4.3 4.1 4.1 4.3  CL 102 102 101 100 101  CO2 23 22 23 25 24   GLUCOSE 116* 117* 96 119* 121*  BUN 17 14 15 20 21   CREATININE 1.37* 1.12 1.21 1.06 1.05  CALCIUM 8.8 8.8 8.6 8.7 8.4  MG  --  2.1 2.0 2.1  --   PHOS  --  3.5 4.0 3.8  --     Liver Function Tests:  Recent Labs Lab 11/01/13 1812 11/02/13 0355 11/02/13 0500 11/03/13 0742 11/04/13 0255  AST 36 31 32 35 28  ALT 28 23 22 24 20   ALKPHOS 134* 118* 101 102 91  BILITOT 0.6 0.6 0.7 0.4 0.4  PROT 7.6 7.3 6.8 7.0 6.5  ALBUMIN 3.3* 3.0* 2.9* 2.8* 2.7*    Recent Labs Lab 11/01/13 1812  LIPASE 39   No results found for this basename: AMMONIA,  in the last 168 hours  CBC:  Recent Labs Lab 11/01/13 1812 11/02/13 0355 11/02/13  0500 11/03/13 0742 11/04/13 0255  WBC 10.7* 8.0 8.5 8.7 7.6  NEUTROABS 8.8*  --   --  6.5  --   HGB 12.5* 12.5* 10.9* 10.8* 10.1*  HCT 38.7* 39.8 34.5* 34.4* 31.8*  MCV 85.6 85.8 86.5 85.8 85.3  PLT 234 243 240 248 207    Cardiac Enzymes:  Recent Labs Lab 11/01/13 1812  TROPONINI <0.30    BNP: BNP (last 3 results)  Recent Labs  09/13/13 1349 09/25/13 0400 11/01/13 1033  PROBNP 3883.0* 7019.0* 877.6*    CBG:  Recent Labs Lab 11/02/13 2319 11/03/13 0605 11/03/13 1138  11/03/13 1758 11/03/13 2346  GLUCAP 114* 114* 124* 108* 123*    Coagulation Studies:  Recent Labs  11/01/13 1812 11/02/13 0355 11/02/13 1600 11/03/13 0742 11/04/13 0255  LABPROT 34.3* 31.2* 23.5* 22.8* 19.2*  INR 3.56* 3.15* 2.17* 2.09* 1.67*    Other results:   Imaging: Dg Chest Port 1 View  11/03/2013   CLINICAL DATA:  Ventricular assistance device.  EXAM: PORTABLE CHEST - 1 VIEW  COMPARISON:  November 02, 2013.  FINDINGS: Stable cardiomediastinal silhouette. Sternotomy wires and left-sided pacemaker are noted. Right subclavian catheter line is unchanged in position. No pneumothorax is noted. Ventricular assistance device is unchanged in position. Increased right upper lobe opacity is noted concerning for developing pneumonia.  IMPRESSION: Stable support apparatus. Increased right upper lobe opacity is noted concerning for developing pneumonia.   Electronically Signed   By: Sabino Dick M.D.   On: 11/03/2013 08:09   Dg Chest Port 1 View  11/02/2013   CLINICAL DATA:  Central line placement  EXAM: PORTABLE CHEST - 1 VIEW  COMPARISON:  DG CHEST 1V PORT dated 11/02/2013  FINDINGS: Right subclavian vein central venous catheter placed. Tip is at the cavoatrial junction. No pneumothorax. Ventricular assist device stable. Tubular structures stable otherwise. Left subclavian AICD stable. Lungs are stable in appearance.  IMPRESSION: Right subclavian vein central venous catheter place with its tip at the cavoatrial junction and no pneumothorax.   Electronically Signed   By: Maryclare Bean M.D.   On: 11/02/2013 16:44   Dg Abd Portable 1v  11/03/2013   CLINICAL DATA:  Small bowel obstruction.  EXAM: PORTABLE ABDOMEN - 1 VIEW  COMPARISON:  Single view of the abdomen 11/02/2013 and two views of the abdomen 11/01/2013.  FINDINGS: Although gaseous distention of small bowel persists, the appearance is improved. There is some gas and stool in the colon which appears increased. No new abnormality.  IMPRESSION:  Improved small bowel obstruction.   Electronically Signed   By: Inge Rise M.D.   On: 11/03/2013 08:25        Assessment:   1. SBO 2. 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 CAD 4. Hypothyroidism  Plan/Discussion:    SBO resolving slowly. NGT is out. Hopefully we can start clears today. Continue to ambulate.   INR 1.67. Heparin started.   TSH high Free T3 is low. T4 ok.  IV synthroid increased. Will also increase outpatient dose to 165mcg.   CHF and VAD parameters stable. MAPS look good.    On ceftriaxone for skin wound. Yesterday CXR with ?RUL infiltrate. Repeat CXR pending. Currently asymptomatic. If develops symptoms can broaden coverage.   Appreciate [facility] and Dr. Osker Mason care.  Length of Stay: 3 Glori Bickers MD 11/04/2013, 8:04 AM  Advanced Heart Failure Team Pager [phone] (M-F; 7a - 4p)  Please contact [facility] for night-coverage after hours (4p -7a ) and weekends  on [email or web address]

## 2013-11-04 NOTE — Progress Notes (Addendum)
[name] Progress Note    Subjective: Patient much better today.  Had several bowel movements.  No abdominal pain.  Objective: Vital signs in last 24 hours: Temp:  [97.4 F (36.3 C)-98.5 F (36.9 C)] 97.4 F (36.3 C) (03/07 0748) Pulse Rate:  [67-79] 73 (03/07 0800) Resp:  [11-27] 19 (03/07 0800) SpO2:  [90 %-100 %] 90 % (03/07 0800) Weight:  [70.9 kg (156 lb 4.9 oz)] 70.9 kg (156 lb 4.9 oz) (03/07 0500) Last BM Date: 11/03/13  Intake/Output from previous day: 03/06 0701 - 03/07 0700 In: 2593.5 [I.V.:776.6; IV Piggyback:50; TPN:1766.9] Out: 900 [Urine:900] Intake/Output this shift: Total I/O In: 127 [I.V.:34; TPN:93] Out: 500 [Urine:500]  General: No acute distress  Lungs: Clear  Abd: Soft, good bowel sounds.  At his base line  Extremities: No changes  Neuro: Intact  Lab Results:  @LABLAST2 (wbc:2,hgb:2,hct:2,plt:2) BMET  Recent Labs  11/03/13 0742 11/04/13 0255  NA 135* 136*  K 4.1 4.3  CL 100 101  CO2 25 24  GLUCOSE 119* 121*  BUN 20 21  CREATININE 1.06 1.05  CALCIUM 8.7 8.4   PT/INR  Recent Labs  11/03/13 0742 11/04/13 0255  LABPROT 22.8* 19.2*  INR 2.09* 1.67*   ABG No results found for this basename: PHART, PCO2, PO2, HCO3,  in the last 72 hours  Studies/Results: Dg Chest Port 1 View  11/03/2013   CLINICAL DATA:  Ventricular assistance device.  EXAM: PORTABLE CHEST - 1 VIEW  COMPARISON:  November 02, 2013.  FINDINGS: Stable cardiomediastinal silhouette. Sternotomy wires and left-sided pacemaker are noted. Right subclavian catheter line is unchanged in position. No pneumothorax is noted. Ventricular assistance device is unchanged in position. Increased right upper lobe opacity is noted concerning for developing pneumonia.  IMPRESSION: Stable support apparatus. Increased right upper lobe opacity is noted concerning for developing pneumonia.   Electronically Signed   By: Sabino Dick M.D.   On: 11/03/2013 08:09   Dg Chest Port 1 View  11/02/2013    CLINICAL DATA:  Central line placement  EXAM: PORTABLE CHEST - 1 VIEW  COMPARISON:  DG CHEST 1V PORT dated 11/02/2013  FINDINGS: Right subclavian vein central venous catheter placed. Tip is at the cavoatrial junction. No pneumothorax. Ventricular assist device stable. Tubular structures stable otherwise. Left subclavian AICD stable. Lungs are stable in appearance.  IMPRESSION: Right subclavian vein central venous catheter place with its tip at the cavoatrial junction and no pneumothorax.   Electronically Signed   By: Maryclare Bean M.D.   On: 11/02/2013 16:44   Dg Abd Portable 1v  11/03/2013   CLINICAL DATA:  Small bowel obstruction.  EXAM: PORTABLE ABDOMEN - 1 VIEW  COMPARISON:  Single view of the abdomen 11/02/2013 and two views of the abdomen 11/01/2013.  FINDINGS: Although gaseous distention of small bowel persists, the appearance is improved. There is some gas and stool in the colon which appears increased. No new abnormality.  IMPRESSION: Improved small bowel obstruction.   Electronically Signed   By: Inge Rise M.D.   On: 11/03/2013 08:25    Anti-infectives: Anti-infectives   Start     Dose/Rate Route Frequency Ordered Stop   11/02/13 0130  cefTRIAXone (ROCEPHIN) 1 g in dextrose 5 % 50 mL IVPB     1 g 100 mL/hr over 30 Minutes Intravenous Every 24 hours 11/02/13 0121     11/01/13 2200  cephALEXin (KEFLEX) capsule 500 mg  Status:  Discontinued     500 mg Oral 2 times daily 11/01/13 2157  11/02/13 0121      Assessment/Plan: s/p  Advance diet Can restart coumadin Likely ileus after hypotensive episode in office that led to syncope.  No bowel obstruction. Call us back if symptoms return.  LOS: 3 days   Kathryne Eriksson. Dahlia Bailiff, MD, FACS [phone] [facility] 11/04/2013

## 2013-11-04 NOTE — Progress Notes (Signed)
PARENTERAL NUTRITION CONSULT NOTE:  FOLLOW-UP  Pharmacy Consult:  TPN Indication: SBO  Allergies  Allergen Reactions  . Diovan [Valsartan] Other (See Comments)    Hypotension at low dose, hyperkalemia  . Lipitor [Atorvastatin Calcium] Other (See Comments)    Muscle pain    Patient Measurements: Height: 5\' 7"  (170.2 cm) Weight: 156 lb 4.9 oz (70.9 kg) IBW/kg (Calculated) : 66.1 Usual Weight: 68 kg  Vital Signs: Temp: 98.1 F (36.7 C) (03/07 0000) Temp src: Oral (03/07 0000) Pulse Rate: 70 (03/07 0700) Intake/Output from previous day: 03/06 0701 - 03/07 0700 In: 2593.5 [I.V.:776.6; IV Piggyback:50; TPN:1766.9] Out: 900 [Urine:900]  Labs:  Recent Labs  11/02/13 0500 11/02/13 1600 11/03/13 0742 11/04/13 0255  WBC 8.5  --  8.7 7.6  HGB 10.9*  --  10.8* 10.1*  HCT 34.5*  --  34.4* 31.8*  PLT 240  --  248 207  INR  --  2.17* 2.09* 1.67*     Recent Labs  11/02/13 0355 11/02/13 0500 11/03/13 0500 11/03/13 0742 11/04/13 0255  NA 137 137  --  135* 136*  K 4.3 4.1  --  4.1 4.3  CL 102 101  --  100 101  CO2 22 23  --  25 24  GLUCOSE 117* 96  --  119* 121*  BUN 14 15  --  20 21  CREATININE 1.12 1.21  --  1.06 1.05  CALCIUM 8.8 8.6  --  8.7 8.4  MG 2.1 2.0  --  2.1  --   PHOS 3.5 4.0  --  3.8  --   PROT 7.3 6.8  --  7.0 6.5  ALBUMIN 3.0* 2.9*  --  2.8* 2.7*  AST 31 32  --  35 28  ALT 23 22  --  24 20  ALKPHOS 118* 101  --  102 91  BILITOT 0.6 0.7  --  0.4 0.4  PREALBUMIN  --  18.5  --  18.2  --   TRIG  --   --  125  --   --    Estimated Creatinine Clearance: 61.2 ml/min (by C-G formula based on Cr of 1.05).    Recent Labs  11/03/13 1138 11/03/13 1758 11/03/13 2346  GLUCAP 124* 108* 123*     Insulin Requirements in the past 24 hours:  1 unit sensitive SSI  Assessment: 67 YOM with history of recent LVAD and discharged on 09/19/13.  Patient returned with N/V and CT scan revealed SBO.  Pharmacy consulted to manage TPN.  SBO is resolving and diet is  being advanced.  GI: distended abdomen on bowel rest with NGT. NGT d/c'ed 3/6 with resolving SBO.  Had flatus and several BMs.  Prealbumin / TG WNL.  PPI IV Endo: hypothyroid on Synthroid (dose increased).  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 Afebrile, WBC WNL. Anticoag: Heparin for LVAD, Coumadin PTA with INR 3.5 upon admit - Coumadin held 3/4, s/p FFP x 1 on 3/5 so that PICC line can be placed -- INR down 1.67, CBC stable Best Practices: PPI IV, heparin  gtt TPN Access: CVC placed 11/02/13 TPN day#: 2 (3/5 >> )  Current Nutrition:  TPN  Nutritional Goals:  2050 - 2250 kCal and 105 - 115 gm protein daily   Plan:  - Continue Clinimix E 5/15 at 83 ml/hr and lipids at 59ml/hr.  TPN provides 1894 kCal and 100gm protein daily, meeting > 90% of patient's needs.  No plan to meet 100% of needs as expect TPN to be d/c'ed rather soon. - Daily IV multivitamin in TPN - Trace elements on M/W/F due to national shortage - Highly recommend discontinuing TPN.  Paged MD without response.    Keilyn Nadal D. Mina Marble, PharmD, BCPS Pager:  [phone] 11/04/2013, 12:57 PM

## 2013-11-05 ENCOUNTER — Inpatient Hospital Stay (HOSPITAL_COMMUNITY): Payer: Medicare Other

## 2013-11-05 DIAGNOSIS — I959 Hypotension, unspecified: Secondary | ICD-10-CM

## 2013-11-05 DIAGNOSIS — R579 Shock, unspecified: Secondary | ICD-10-CM

## 2013-11-05 DIAGNOSIS — I5023 Acute on chronic systolic (congestive) heart failure: Secondary | ICD-10-CM

## 2013-11-05 LAB — POCT I-STAT, CHEM 8
BUN: 29 mg/dL — ABNORMAL HIGH (ref 6–23)
Calcium, Ion: 1.18 mmol/L (ref 1.13–1.30)
Chloride: 100 mEq/L (ref 96–112)
Creatinine, Ser: 1.5 mg/dL — ABNORMAL HIGH (ref 0.50–1.35)
Glucose, Bld: 149 mg/dL — ABNORMAL HIGH (ref 70–99)
HCT: 22 % — ABNORMAL LOW (ref 39.0–52.0)
Hemoglobin: 7.5 g/dL — ABNORMAL LOW (ref 13.0–17.0)
Potassium: 4.6 mEq/L (ref 3.7–5.3)
Sodium: 134 mEq/L — ABNORMAL LOW (ref 137–147)
TCO2: 20 mmol/L (ref 0–100)

## 2013-11-05 LAB — COMPREHENSIVE METABOLIC PANEL
ALT: 16 U/L (ref 0–53)
AST: 24 U/L (ref 0–37)
Albumin: 2.6 g/dL — ABNORMAL LOW (ref 3.5–5.2)
Alkaline Phosphatase: 85 U/L (ref 39–117)
BUN: 21 mg/dL (ref 6–23)
CO2: 21 mEq/L (ref 19–32)
Calcium: 8.5 mg/dL (ref 8.4–10.5)
Chloride: 102 mEq/L (ref 96–112)
Creatinine, Ser: 0.89 mg/dL (ref 0.50–1.35)
GFR calc Af Amer: >90 mL/min (ref >90)
GFR calc non Af Amer: 85 mL/min — ABNORMAL LOW (ref >90)
Glucose, Bld: 130 mg/dL — ABNORMAL HIGH (ref 70–99)
Potassium: 4.5 mEq/L (ref 3.7–5.3)
Sodium: 136 mEq/L — ABNORMAL LOW (ref 137–147)
Total Bilirubin: 0.3 mg/dL (ref 0.3–1.2)
Total Protein: 6.5 g/dL (ref 6.0–8.3)

## 2013-11-05 LAB — CBC
HCT: 30.2 % — ABNORMAL LOW (ref 39.0–52.0)
HCT: 20.7 % — ABNORMAL LOW (ref 39.0–52.0)
Hemoglobin: 9.5 g/dL — ABNORMAL LOW (ref 13.0–17.0)
Hemoglobin: 6.7 g/dL — CL (ref 13.0–17.0)
MCH: 26.6 pg (ref 26.0–34.0)
MCH: 27.7 pg (ref 26.0–34.0)
MCHC: 31.5 g/dL (ref 30.0–36.0)
MCHC: 32.4 g/dL (ref 30.0–36.0)
MCV: 84.6 fL (ref 78.0–100.0)
MCV: 85.5 fL (ref 78.0–100.0)
Platelets: 220 10*3/uL (ref 150–400)
Platelets: 234 10*3/uL (ref 150–400)
RBC: 3.57 MIL/uL — ABNORMAL LOW (ref 4.22–5.81)
RBC: 2.42 MIL/uL — ABNORMAL LOW (ref 4.22–5.81)
RDW: 19.9 % — ABNORMAL HIGH (ref 11.5–15.5)
RDW: 20.4 % — ABNORMAL HIGH (ref 11.5–15.5)
WBC: 9.1 10*3/uL (ref 4.0–10.5)
WBC: 12.5 10*3/uL — ABNORMAL HIGH (ref 4.0–10.5)

## 2013-11-05 LAB — BASIC METABOLIC PANEL
BUN: 29 mg/dL — ABNORMAL HIGH (ref 6–23)
CO2: 19 mEq/L (ref 19–32)
Calcium: 7.9 mg/dL — ABNORMAL LOW (ref 8.4–10.5)
Chloride: 100 mEq/L (ref 96–112)
Creatinine, Ser: 1.38 mg/dL — ABNORMAL HIGH (ref 0.50–1.35)
GFR calc Af Amer: 58 mL/min — ABNORMAL LOW (ref >90)
GFR calc non Af Amer: 50 mL/min — ABNORMAL LOW (ref >90)
Glucose, Bld: 168 mg/dL — ABNORMAL HIGH (ref 70–99)
Potassium: 4.6 mEq/L (ref 3.7–5.3)
Sodium: 132 mEq/L — ABNORMAL LOW (ref 137–147)

## 2013-11-05 LAB — POCT I-STAT 3, ART BLOOD GAS (G3+)
Acid-base deficit: 14 mmol/L — ABNORMAL HIGH (ref 0.0–2.0)
Acid-base deficit: 8 mmol/L — ABNORMAL HIGH (ref 0.0–2.0)
Bicarbonate: 13.5 mEq/L — ABNORMAL LOW (ref 20.0–24.0)
Bicarbonate: 16.8 mEq/L — ABNORMAL LOW (ref 20.0–24.0)
O2 Saturation: 92 %
O2 Saturation: 97 %
Patient temperature: 98.6
Patient temperature: 99.6
TCO2: 15 mmol/L (ref 0–100)
TCO2: 18 mmol/L (ref 0–100)
pCO2 arterial: 30.7 mmHg — ABNORMAL LOW (ref 35.0–45.0)
pCO2 arterial: 39 mmHg (ref 35.0–45.0)
pH, Arterial: 7.15 — CL (ref 7.350–7.450)
pH, Arterial: 7.348 — ABNORMAL LOW (ref 7.350–7.450)
pO2, Arterial: 120 mmHg — ABNORMAL HIGH (ref 80.0–100.0)
pO2, Arterial: 65 mmHg — ABNORMAL LOW (ref 80.0–100.0)

## 2013-11-05 LAB — CARBOXYHEMOGLOBIN
Carboxyhemoglobin: 1.8 % — ABNORMAL HIGH (ref 0.5–1.5)
Carboxyhemoglobin: 1.1 % (ref 0.5–1.5)
Methemoglobin: 1.5 % (ref 0.0–1.5)
Methemoglobin: 0.6 % (ref 0.0–1.5)
O2 Saturation: 51.4 %
O2 Saturation: 30.8 %
Total hemoglobin: 7.8 g/dL — ABNORMAL LOW (ref 13.5–18.0)
Total hemoglobin: 5.9 g/dL — CL (ref 13.5–18.0)

## 2013-11-05 LAB — PREPARE RBC (CROSSMATCH)

## 2013-11-05 LAB — PROTIME-INR
INR: 1.33 (ref 0.00–1.49)
Prothrombin Time: 16.2 seconds — ABNORMAL HIGH (ref 11.6–15.2)

## 2013-11-05 LAB — LACTATE DEHYDROGENASE: LDH: 238 U/L (ref 94–250)

## 2013-11-05 LAB — HEPARIN LEVEL (UNFRACTIONATED): Heparin Unfractionated: 0.33 IU/mL (ref 0.30–0.70)

## 2013-11-05 LAB — GLUCOSE, CAPILLARY
Glucose-Capillary: 127 mg/dL — ABNORMAL HIGH (ref 70–99)
Glucose-Capillary: 118 mg/dL — ABNORMAL HIGH (ref 70–99)
Glucose-Capillary: 131 mg/dL — ABNORMAL HIGH (ref 70–99)

## 2013-11-05 LAB — LACTIC ACID, PLASMA: Lactic Acid, Venous: 3.2 mmol/L — ABNORMAL HIGH (ref 0.5–2.2)

## 2013-11-05 MED ORDER — NOREPINEPHRINE BITARTRATE 1 MG/ML IJ SOLN
2.0000 ug/min | INTRAVENOUS | Status: DC
  Administered 2013-11-07 – 2013-11-10 (×2): 7 ug/min via INTRAVENOUS
  Administered 2013-11-11: 2.987 ug/min via INTRAVENOUS
  Administered 2013-11-11: 4 ug/min via INTRAVENOUS
  Filled 2013-11-05 (×5): qty 16

## 2013-11-05 MED ORDER — SODIUM BICARBONATE 8.4 % IV SOLN
INTRAVENOUS | Status: DC
  Administered 2013-11-06: 03:00:00 via INTRAVENOUS
  Filled 2013-11-05 (×8): qty 150

## 2013-11-05 MED ORDER — PIPERACILLIN-TAZOBACTAM 3.375 G IVPB
3.3750 g | Freq: Three times a day (TID) | INTRAVENOUS | Status: DC
  Administered 2013-11-05 – 2013-11-12 (×19): 3.375 g via INTRAVENOUS
  Filled 2013-11-05 (×22): qty 50

## 2013-11-05 MED ORDER — EPINEPHRINE HCL 1 MG/ML IJ SOLN
0.5000 ug/min | INTRAVENOUS | Status: DC
  Filled 2013-11-05: qty 1

## 2013-11-05 MED ORDER — SODIUM BICARBONATE 8.4 % IV SOLN
INTRAVENOUS | Status: AC
  Administered 2013-11-05: 100 meq via INTRAVENOUS
  Filled 2013-11-05: qty 50

## 2013-11-05 MED ORDER — VANCOMYCIN HCL IN DEXTROSE 1-5 GM/200ML-% IV SOLN
1000.0000 mg | INTRAVENOUS | Status: DC
  Administered 2013-11-05 – 2013-11-06 (×2): 1000 mg via INTRAVENOUS
  Filled 2013-11-05 (×2): qty 200

## 2013-11-05 MED ORDER — MILRINONE IN DEXTROSE 20 MG/100ML IV SOLN
0.2500 ug/kg/min | INTRAVENOUS | Status: DC
  Administered 2013-11-05: 0.25 ug/kg/min via INTRAVENOUS
  Filled 2013-11-05: qty 100

## 2013-11-05 MED ORDER — WARFARIN SODIUM 5 MG PO TABS
5.0000 mg | ORAL_TABLET | Freq: Once | ORAL | Status: AC
  Administered 2013-11-05: 5 mg via ORAL
  Filled 2013-11-05: qty 1

## 2013-11-05 MED ORDER — FENTANYL CITRATE 0.05 MG/ML IJ SOLN
INTRAMUSCULAR | Status: AC
  Filled 2013-11-05: qty 2

## 2013-11-05 MED ORDER — AMIODARONE HCL IN DEXTROSE 360-4.14 MG/200ML-% IV SOLN
INTRAVENOUS | Status: AC
Start: 2013-11-05 — End: 2013-11-06
  Filled 2013-11-05: qty 200

## 2013-11-05 MED ORDER — IOHEXOL 300 MG/ML  SOLN
20.0000 mL | INTRAMUSCULAR | Status: DC
  Administered 2013-11-05: 20 mL via ORAL

## 2013-11-05 MED ORDER — MIDAZOLAM HCL 2 MG/2ML IJ SOLN
INTRAMUSCULAR | Status: AC
  Administered 2013-11-06: 2 mg
  Filled 2013-11-05: qty 2

## 2013-11-05 MED ORDER — SODIUM CHLORIDE 0.9 % IV BOLUS (SEPSIS)
1000.0000 mL | Freq: Once | INTRAVENOUS | Status: AC
  Administered 2013-11-05: 1000 mL via INTRAVENOUS

## 2013-11-05 MED ORDER — SODIUM CHLORIDE 0.9 % IV SOLN
Freq: Once | INTRAVENOUS | Status: AC
  Administered 2013-11-05: 16:00:00 via INTRAVENOUS

## 2013-11-05 MED ORDER — FUROSEMIDE 10 MG/ML IJ SOLN
40.0000 mg | Freq: Once | INTRAMUSCULAR | Status: AC
  Administered 2013-11-05: 40 mg via INTRAVENOUS
  Filled 2013-11-05: qty 4

## 2013-11-05 MED ORDER — INSULIN ASPART 100 UNIT/ML ~~LOC~~ SOLN
0.0000 [IU] | Freq: Three times a day (TID) | SUBCUTANEOUS | Status: DC
  Administered 2013-11-06: 2 [IU] via SUBCUTANEOUS
  Administered 2013-11-08 – 2013-11-09 (×3): 1 [IU] via SUBCUTANEOUS

## 2013-11-05 MED ORDER — VASOPRESSIN 20 UNIT/ML IJ SOLN
0.0300 [IU]/min | INTRAVENOUS | Status: DC
  Administered 2013-11-06 – 2013-11-07 (×2): 0.03 [IU]/min via INTRAVENOUS
  Filled 2013-11-05 (×3): qty 2.5

## 2013-11-05 MED ORDER — NOREPINEPHRINE BITARTRATE 1 MG/ML IJ SOLN
20.0000 ug/min | INTRAMUSCULAR | Status: DC
  Administered 2013-11-05: 5 ug/min via INTRAVENOUS
  Filled 2013-11-05: qty 4

## 2013-11-05 NOTE — Progress Notes (Signed)
Dangled patient c/o dizziness but then non after a few minutes. Stood to go to bedside commode. While standing patient complained of feeling dizzy then was shaking and passed out briefly onto bed. Dr. Haroldine Laws notified.

## 2013-11-05 NOTE — Progress Notes (Signed)
ANTICOAGULATION CONSULT NOTE - Follow up  Pharmacy Consult for warfarin>>heparin when INR<2 Indication: LVAD  Allergies  Allergen Reactions  . Diovan [Valsartan] Other (See Comments)    Hypotension at low dose, hyperkalemia  . Lipitor [Atorvastatin Calcium] Other (See Comments)    Muscle pain    Patient Measurements: Height: 5\' 9"  (175.3 cm) Weight: 156 lb 4.9 oz (70.9 kg) IBW/kg (Calculated) : 70.7   Vital Signs: Temp: 98 F (36.7 C) (03/08 0751) Temp src: Oral (03/08 0751) Pulse Rate: 76 (03/08 0700)  Labs:  Recent Labs  11/03/13 0742 11/03/13 1926 11/04/13 0255 11/05/13 0330  HGB 10.8*  --  10.1* 9.5*  HCT 34.4*  --  31.8* 30.2*  PLT 248  --  207 220  LABPROT 22.8*  --  19.2* 16.2*  INR 2.09*  --  1.67* 1.33  HEPARINUNFRC  --  0.41 0.39 0.33  CREATININE 1.06  --  1.05 0.89    Estimated Creatinine Clearance: 77.2 ml/min (by C-G formula based on Cr of 0.89).   Medical History: Past Medical History  Diagnosis Date  . Ischemic cardiomyopathy     a. 10/09 Echo: Sev LV Dysfxn, inf/lat AK, Mod MR.  [name];  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 Hypertension   . PVC (premature ventricular contraction)   . Pacemaker   . CHF (congestive heart failure)   . Shortness of breath   . Automatic implantable cardioverter-defibrillator in situ    Assessment: 63 YOM with LVAD admitted 11/01/2013 with SBO. Pharmacy consulted to start heparin while off warfarin.  HPI: 09/19/2013 seen in HF clinic and had syncopal episode- given fluids and labs and ECHO were ok. On the way home, patient vomited and was lethargic and was brought back  to the ED for admission found to have SBO.    Coag: LVAD:  Heparin level at goal, no bleeding noted Warfarin: PTA: 5 daily 2.5 2d/wk, INR 3.7 on this.    GI: SBO, on  TPN was started 3/5. Clears started 3/7  Infectious Disease: superficial infection around driveline site  keflex as outpt,  3/7 CTX>  no fevers, wbc stable   Cardiovascular:HF with ef 15%, s/p lvad, maps 90-100, holding meds d/t being npo, added hydralazine to keep maps <100  Endocrinology:tsh elevated, T3 low- on home dose of synthroid  Nephrology: CrCl 75-80s lytes ok, no diuretics  PTA Medication Issues:home meds on hold as he is npo  Goal of Therapy:  INR 2-3  Monitor platelets by anticoagulation protocol: Yes Heparin level 0.3-0.5  Plan:  1. Continueheparin without bolus at 1400 units/hr 2. Daily PT/INR/HL/CBC 3. Follow for s/s bleeding 4. Warfarin 5 mg x 1  Thank you for allowing pharmacy to be a part of this patients care team.  Rowe Robert Pharm.D., BCPS Clinical Pharmacist 11/05/2013 7:57 AM Pager: [phone] Phone: [phone]

## 2013-11-05 NOTE — Progress Notes (Signed)
ANTIBIOTIC CONSULT NOTE - INITIAL  Pharmacy Consult for Vancomycin and Zosyn Indication: empiric for rule-out sepsis  Allergies  Allergen Reactions  . Diovan [Valsartan] Other (See Comments)    Hypotension at low dose, hyperkalemia  . Lipitor [Atorvastatin Calcium] Other (See Comments)    Muscle pain    Patient Measurements: Height: 5\' 9"  (175.3 cm) Weight: 151 lb 1.6 oz (68.539 kg) IBW/kg (Calculated) : 70.7  Vital Signs: Temp: 98.6 F (37 C) (03/08 1531) Temp src: Oral (03/08 1531) Pulse Rate: 98 (03/08 2125) Intake/Output from previous day: 03/07 0701 - 03/08 0700 In: 3568.1 [P.O.:600; I.V.:782; IV Piggyback:50; TPN:2136.1] Out: 2475 [Urine:2475] Intake/Output from this shift: Total I/O In: 104.5 [I.V.:104.5] Out: -   Labs:  Recent Labs  11/04/13 0255 11/05/13 0330 11/05/13 2059 11/05/13 2125  WBC 7.6 9.1  --  12.5*  HGB 10.1* 9.5* 7.5* 6.7*  PLT 207 220  --  234  CREATININE 1.05 0.89 1.50* 1.38*   Estimated Creatinine Clearance: 48.3 ml/min (by C-G formula based on Cr of 1.38). No results found for this basename: VANCOTROUGH, Corlis Leak, VANCORANDOM, GENTTROUGH, GENTPEAK, GENTRANDOM, TOBRATROUGH, TOBRAPEAK, TOBRARND, AMIKACINPEAK, AMIKACINTROU, AMIKACIN,  in the last 72 hours   Microbiology: Recent Results (from the past 720 hour(s))  MRSA PCR SCREENING     Status: None   Collection Time    11/01/13  9:37 PM      Result Value Ref Range Status   MRSA by PCR NEGATIVE  NEGATIVE Final   Comment:            The GeneXpert MRSA Assay (FDA     approved for NASAL specimens     only), is one component of a     comprehensive MRSA colonization     surveillance program. It is not     intended to diagnose MRSA     infection nor to guide or     monitor treatment for     MRSA infections.    Medical History: Past Medical History  Diagnosis Date  . Ischemic cardiomyopathy     a. 10/09 Echo: Sev LV Dysfxn, inf/lat AK, Mod MR.  [name] Hypercholesteremia   .  Osteoarthritis     a. s/p R TKR  . Anxiety   . Hypothyroidism   . CAD (coronary artery disease)     S/P stenting of LCX in 2004;  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 Hypertension   . PVC (premature ventricular contraction)   . Pacemaker   . CHF (congestive heart failure)   . Shortness of breath   . Automatic implantable cardioverter-defibrillator in situ     Medications:  Anti-infectives   Start     Dose/Rate Route Frequency Ordered Stop   11/02/13 0130  cefTRIAXone (ROCEPHIN) 1 g in dextrose 5 % 50 mL IVPB  Status:  Discontinued     1 g 100 mL/hr over 30 Minutes Intravenous Every 24 hours 11/02/13 0121 11/05/13 2209   11/01/13 2200  cephALEXin (KEFLEX) capsule 500 mg  Status:  Discontinued     500 mg Oral 2 times daily 11/01/13 2157 11/02/13 0121     Assessment: 73 year old male s/p LVAD to begin empiric Vancomycin and Zosyn for rule-out sepsis.  He  has acute renal insufficiency with decreased urine output noted today.  Goal of Therapy:  Vancomycin trough level 15-20 mcg/ml  Plan:  Vancomycin 1gm IV q24h Zosyn 3.375gm IV q8h extended infusion Monitor renal function closely  Check Vancomycin trough at steady state  Legrand Como, Pharm.D., BCPS, AAHIVP Clinical Pharmacist Phone: [phone] or [phone] 11/05/2013, 10:20 PM

## 2013-11-05 NOTE — Progress Notes (Signed)
Dr. Haroldine Laws notified of persistent MAP of 50. CVP 6. Istat Hbg 7.5. Order received for CBC, BMET, Lactate, Type and Screen, 1 liter NS bolus, turn Levo up 70mcg, and to turn off Milrinone at this time. Will continue to closely monitor. Richarda Blade RN

## 2013-11-05 NOTE — Progress Notes (Signed)
Patient ID: Johnny Oneill, male   DOB: [birth year], 73 y.o.   MRN: [id number]    Reason for Admission: Vomiting, weakness, hypotension   HPI:    Johnny Oneill is a 73 year old patient with a history of CAD s/p stent to LCX 2004 with re-occlusion of LCX with lateral MI 2009 and repeat stenting, total occlusion of RCA, ICM, chronic systolic heart failure, hypothyroidism, HLD and arthritis. 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   Admitted to [facility] 09/13/13 through 10/03/13. Initially admitted with decompenstaed heart failure and low output. Required IABP for stabilization. He ultimately had LVAD HM II placed on 09/19/13. He suffered from RV failure after the procedure and required milrinone for >7 days which was gradually weaned to off. During his stay he did develop acute ICU psychosis with delirium which was treated with Zyprexa. Once he was transferred out of the ICU this improved and he returned back to baseline. He was not discharged on beta blocker. D/C weight 151 pounds.   Admitted on 3/4 with SBO.  NGT out taking thick liquids    CXR:  persistant RUL infiltrate but no fever and not acting like pneumonia KUB :  Persistent mild dilatation of small bowel  Physical Exam: GENERAL: looks good  NAD HEENT: normal   NECK: Supple, JVP flat . 2+ bilaterally, no bruits. No lymphadenopathy or thyromegaly appreciated.  CARDIAC: Mechanical heart sounds with LVAD hum present. And intermitant AV sounds  LUNGS: mild interstitial crackles R greater than left  ABDOMEN: Soft, nondistended, nontender, hypoactive bowel sounds but improving LVAD exit site: well-healed and incorporated. Dressing dry and intact. No erythema or drainage. Stabilization device present and accurately applied. Driveline dressing is being changed daily per sterile technique.  EXTREMITIES: Warm and dry, no cyanosis, clubbing, rash or edema  NEUROLOGIC: Alert and oriented x 4. Gait steady. No aphasia. No dysarthria. Affect  pleasant.    Scheduled Meds: . cefTRIAXone (ROCEPHIN)  IV  1 g Intravenous Q24H  . hydrALAZINE  10 mg Intravenous 3 times per day  . insulin aspart  0-9 Units Subcutaneous 4 times per day  . levothyroxine  100 mcg Intravenous Daily  . pantoprazole (PROTONIX) IV  40 mg Intravenous Daily  . sodium chloride  10-40 mL Intracatheter Q12H  . warfarin  5 mg Oral ONCE-1800  . Warfarin - Pharmacist Dosing Inpatient   Does not apply q1800   Continuous Infusions: . sodium chloride 20 mL/hr at 11/05/13 1000  . Marland KitchenTPN (CLINIMIX-E) Adult 83 mL/hr at 11/05/13 1000   And  . fat emulsion 250 mL (11/05/13 1000)  . heparin 1,400 Units/hr (11/05/13 1000)   PRN Meds:.diphenhydrAMINE, fentaNYL, LORazepam, ondansetron (ZOFRAN) IV, phenol, sodium chloride  Telemetry: SR   Labs: Basic Metabolic Panel:  Recent Labs Lab 11/02/13 0355 11/02/13 0500 11/03/13 0742 11/04/13 0255 11/05/13 0330  NA 137 137 135* 136* 136*  K 4.3 4.1 4.1 4.3 4.5  CL 102 101 100 101 102  CO2 22 23 25 24 21   GLUCOSE 117* 96 119* 121* 130*  BUN 14 15 20 21 21   CREATININE 1.12 1.21 1.06 1.05 0.89  CALCIUM 8.8 8.6 8.7 8.4 8.5  MG 2.1 2.0 2.1  --   --   PHOS 3.5 4.0 3.8  --   --     Liver Function Tests:  Recent Labs Lab 11/02/13 0355 11/02/13 0500 11/03/13 0742 11/04/13 0255 11/05/13 0330  AST 31 32 35 28 24  ALT 23 22 24 20 16   ALKPHOS 118*  101 102 91 85  BILITOT 0.6 0.7 0.4 0.4 0.3  PROT 7.3 6.8 7.0 6.5 6.5  ALBUMIN 3.0* 2.9* 2.8* 2.7* 2.6*    Recent Labs Lab 11/01/13 1812  LIPASE 39   No results found for this basename: AMMONIA,  in the last 168 hours  CBC:  Recent Labs Lab 11/01/13 1812 11/02/13 0355 11/02/13 0500 11/03/13 0742 11/04/13 0255 11/05/13 0330  WBC 10.7* 8.0 8.5 8.7 7.6 9.1  NEUTROABS 8.8*  --   --  6.5  --   --   HGB 12.5* 12.5* 10.9* 10.8* 10.1* 9.5*  HCT 38.7* 39.8 34.5* 34.4* 31.8* 30.2*  MCV 85.6 85.8 86.5 85.8 85.3 84.6  PLT 234 243 240 248 207 220    Cardiac  Enzymes:  Recent Labs Lab 11/01/13 1812  TROPONINI <0.30    BNP: BNP (last 3 results)  Recent Labs  09/13/13 1349 09/25/13 0400 11/01/13 1033  PROBNP 3883.0* 7019.0* 877.6*    CBG:  Recent Labs Lab 11/03/13 2346 11/04/13 1145 11/04/13 1725 11/04/13 2309 11/05/13 0608  GLUCAP 123* 113* 112* 118* 127*    Coagulation Studies:  Recent Labs  11/02/13 1600 11/03/13 0742 11/04/13 0255 11/05/13 0330  LABPROT 23.5* 22.8* 19.2* 16.2*  INR 2.17* 2.09* 1.67* 1.33    Other results:   Imaging: Dg Chest Port 1 View  11/04/2013   CLINICAL DATA:  Right upper lobe infiltrate  EXAM: PORTABLE CHEST - 1 VIEW  COMPARISON:  11/03/2013  FINDINGS: There is a left chest wall ICD with lead in the right atrial appendage and right ventricle. Right subclavian catheter is noted with tip in the projection of the SVC. Ventricular assist device is stable in position from previous exam. The heart size is enlarged. No pleural effusion identified. Right upper lobe lung opacity is unchanged from prior study.  IMPRESSION: 1. No change in right upper lobe airspace opacity.   Electronically Signed   By: Kerby Moors M.D.   On: 11/04/2013 08:42   Dg Abd Portable 1v  11/04/2013   CLINICAL DATA:  Followup bowel obstruction  EXAM: PORTABLE ABDOMEN - 1 VIEW  COMPARISON:  11/03/2013  FINDINGS: Mildly dilated loops of small bowel within the upper abdomen are again noted and appear similar to previous exam. No significant change in enteric contrast material within the proximal right colon. No new abnormalities identified.  IMPRESSION: Stable small bowel dilatation compared with 11/03/2013   Electronically Signed   By: Kerby Moors M.D.   On: 11/04/2013 08:45        Assessment:   1. SBO 2. 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 CAD 4. Hypothyroidism  Plan/Discussion:    SBO:  Advance diet ? D/c TNA in am  Improved Pneumonia:  ? No symptoms but RUL infiltrate persistant on CXR  Continue Rocephin   Needs incentive spirometer  VAD: per DB normal function and MAPS Anticoagulation  On heparin for sub Rx INR pharmacy dose coumadin

## 2013-11-05 NOTE — Progress Notes (Signed)
Dr. Haroldine Laws notified of Coox of 51 and CVP 22. Orders received for Milrinone drip, Lasix and recheck coox in 2 hours. Wife and patient notified of plan. Patient just doesn't feel good today.

## 2013-11-05 NOTE — Progress Notes (Signed)
CRITICAL VALUE ALERT  Critical value received:  Hgb 6.7  Date of notification:  11/05/13  Time of notification:  2151  Critical value read back: yes  Nurse who received alert: A. Darcel Smalling RN  MD notified (1st page):  Dr. Haroldine Laws   MD called to check on patient at 2155. Notified of critical value at that time.

## 2013-11-05 NOTE — Progress Notes (Signed)
Dr. Haroldine Laws notified of CVP 5 (checked by 2 RN) and MAP 50 on both arms. Order to start Levophed to maintain a MAP of 70. Patient currently complaining of dizziness. VAD numbers stable currently. Will continue to closely monitor. Richarda Blade RN

## 2013-11-05 NOTE — Progress Notes (Addendum)
   Pateint seen and examined.  Dr. Kyla Balzarine note reviewed and we discussed.   Johnny Oneill continues to improve. Now advancing diet, having BMs and ambulating. No further ab pain. Stopping TPN.   MAPs in 70s of anti-HTN meds. VAD interrogated personally and all parameters stable with no alarms or PI events.   Will transfer to 2W. Likely home 24-48 hours when INR therapeutic. Continue heparin/coumadin.  No clinical signs PNA. Continue ceftriaxone for now.   Johnny Bensimhon,MD 11:57 AM

## 2013-11-06 ENCOUNTER — Inpatient Hospital Stay (HOSPITAL_COMMUNITY): Payer: Medicare Other

## 2013-11-06 ENCOUNTER — Encounter (HOSPITAL_COMMUNITY)
Admission: EM | Disposition: A | Discharge status: Rehabilitation Facility | Payer: Medicare Other | Source: Home / Self Care | Attending: Internal Medicine

## 2013-11-06 ENCOUNTER — Inpatient Hospital Stay (HOSPITAL_COMMUNITY): Payer: Medicare Other | Admitting: Certified Registered Nurse Anesthetist

## 2013-11-06 ENCOUNTER — Encounter (HOSPITAL_COMMUNITY): Payer: Medicare Other | Admitting: Certified Registered Nurse Anesthetist

## 2013-11-06 DIAGNOSIS — I469 Cardiac arrest, cause unspecified: Secondary | ICD-10-CM

## 2013-11-06 DIAGNOSIS — J942 Hemothorax: Secondary | ICD-10-CM

## 2013-11-06 DIAGNOSIS — R578 Other shock: Secondary | ICD-10-CM

## 2013-11-06 DIAGNOSIS — J9600 Acute respiratory failure, unspecified whether with hypoxia or hypercapnia: Secondary | ICD-10-CM

## 2013-11-06 DIAGNOSIS — J90 Pleural effusion, not elsewhere classified: Secondary | ICD-10-CM

## 2013-11-06 DIAGNOSIS — IMO0002 Reserved for concepts with insufficient information to code with codable children: Secondary | ICD-10-CM

## 2013-11-06 HISTORY — PX: HEMATOMA EVACUATION: SHX5118

## 2013-11-06 HISTORY — PX: VIDEO ASSISTED THORACOSCOPY: SHX5073

## 2013-11-06 LAB — COMPREHENSIVE METABOLIC PANEL
ALT: 293 U/L — ABNORMAL HIGH (ref 0–53)
ALT: 332 U/L — ABNORMAL HIGH (ref 0–53)
ALT: 312 U/L — ABNORMAL HIGH (ref 0–53)
AST: 538 U/L — ABNORMAL HIGH (ref 0–37)
AST: 570 U/L — ABNORMAL HIGH (ref 0–37)
AST: 490 U/L — ABNORMAL HIGH (ref 0–37)
Albumin: 2.6 g/dL — ABNORMAL LOW (ref 3.5–5.2)
Albumin: 2.6 g/dL — ABNORMAL LOW (ref 3.5–5.2)
Albumin: 2.6 g/dL — ABNORMAL LOW (ref 3.5–5.2)
Alkaline Phosphatase: 102 U/L (ref 39–117)
Alkaline Phosphatase: 95 U/L (ref 39–117)
Alkaline Phosphatase: 89 U/L (ref 39–117)
BUN: 29 mg/dL — ABNORMAL HIGH (ref 6–23)
BUN: 31 mg/dL — ABNORMAL HIGH (ref 6–23)
BUN: 32 mg/dL — ABNORMAL HIGH (ref 6–23)
CO2: 18 mEq/L — ABNORMAL LOW (ref 19–32)
CO2: 21 mEq/L (ref 19–32)
CO2: 23 mEq/L (ref 19–32)
Calcium: 8.7 mg/dL (ref 8.4–10.5)
Calcium: 8.4 mg/dL (ref 8.4–10.5)
Calcium: 8.7 mg/dL (ref 8.4–10.5)
Chloride: 98 mEq/L (ref 96–112)
Chloride: 97 mEq/L (ref 96–112)
Chloride: 100 mEq/L (ref 96–112)
Creatinine, Ser: 1.47 mg/dL — ABNORMAL HIGH (ref 0.50–1.35)
Creatinine, Ser: 1.51 mg/dL — ABNORMAL HIGH (ref 0.50–1.35)
Creatinine, Ser: 1.46 mg/dL — ABNORMAL HIGH (ref 0.50–1.35)
GFR calc Af Amer: 54 mL/min — ABNORMAL LOW (ref >90)
GFR calc Af Amer: 52 mL/min — ABNORMAL LOW (ref >90)
GFR calc Af Amer: 54 mL/min — ABNORMAL LOW (ref >90)
GFR calc non Af Amer: 47 mL/min — ABNORMAL LOW (ref >90)
GFR calc non Af Amer: 45 mL/min — ABNORMAL LOW (ref >90)
GFR calc non Af Amer: 47 mL/min — ABNORMAL LOW (ref >90)
Glucose, Bld: 261 mg/dL — ABNORMAL HIGH (ref 70–99)
Glucose, Bld: 169 mg/dL — ABNORMAL HIGH (ref 70–99)
Glucose, Bld: 144 mg/dL — ABNORMAL HIGH (ref 70–99)
Potassium: 3.8 mEq/L (ref 3.7–5.3)
Potassium: 3.9 mEq/L (ref 3.7–5.3)
Potassium: 4 mEq/L (ref 3.7–5.3)
Sodium: 137 mEq/L (ref 137–147)
Sodium: 135 mEq/L — ABNORMAL LOW (ref 137–147)
Sodium: 137 mEq/L (ref 137–147)
Total Bilirubin: 1.3 mg/dL — ABNORMAL HIGH (ref 0.3–1.2)
Total Bilirubin: 1.4 mg/dL — ABNORMAL HIGH (ref 0.3–1.2)
Total Bilirubin: 1.1 mg/dL (ref 0.3–1.2)
Total Protein: 6.3 g/dL (ref 6.0–8.3)
Total Protein: 5.8 g/dL — ABNORMAL LOW (ref 6.0–8.3)
Total Protein: 5.8 g/dL — ABNORMAL LOW (ref 6.0–8.3)

## 2013-11-06 LAB — BASIC METABOLIC PANEL
BUN: 33 mg/dL — ABNORMAL HIGH (ref 6–23)
CO2: 23 mEq/L (ref 19–32)
Calcium: 8.5 mg/dL (ref 8.4–10.5)
Chloride: 99 mEq/L (ref 96–112)
Creatinine, Ser: 1.44 mg/dL — ABNORMAL HIGH (ref 0.50–1.35)
GFR calc Af Amer: 55 mL/min — ABNORMAL LOW (ref >90)
GFR calc non Af Amer: 48 mL/min — ABNORMAL LOW (ref >90)
Glucose, Bld: 118 mg/dL — ABNORMAL HIGH (ref 70–99)
Potassium: 4.5 mEq/L (ref 3.7–5.3)
Sodium: 134 mEq/L — ABNORMAL LOW (ref 137–147)

## 2013-11-06 LAB — POCT I-STAT 3, ART BLOOD GAS (G3+)
Acid-base deficit: 3 mmol/L — ABNORMAL HIGH (ref 0.0–2.0)
Acid-base deficit: 1 mmol/L (ref 0.0–2.0)
Acid-base deficit: 1 mmol/L (ref 0.0–2.0)
Bicarbonate: 21.9 mEq/L (ref 20.0–24.0)
Bicarbonate: 22.9 mEq/L (ref 20.0–24.0)
Bicarbonate: 24.2 mEq/L — ABNORMAL HIGH (ref 20.0–24.0)
Bicarbonate: 22.4 mEq/L (ref 20.0–24.0)
Bicarbonate: 23.3 mEq/L (ref 20.0–24.0)
O2 Saturation: 100 %
O2 Saturation: 96 %
O2 Saturation: 96 %
O2 Saturation: 95 %
O2 Saturation: 96 %
Patient temperature: 97.7
Patient temperature: 36.7
Patient temperature: 36.8
Patient temperature: 36.1
Patient temperature: 37.5
TCO2: 23 mmol/L (ref 0–100)
TCO2: 24 mmol/L (ref 0–100)
TCO2: 25 mmol/L (ref 0–100)
TCO2: 23 mmol/L (ref 0–100)
TCO2: 24 mmol/L (ref 0–100)
pCO2 arterial: 36.3 mmHg (ref 35.0–45.0)
pCO2 arterial: 31.1 mmHg — ABNORMAL LOW (ref 35.0–45.0)
pCO2 arterial: 35.5 mmHg (ref 35.0–45.0)
pCO2 arterial: 32.1 mmHg — ABNORMAL LOW (ref 35.0–45.0)
pCO2 arterial: 36 mmHg (ref 35.0–45.0)
pH, Arterial: 7.386 (ref 7.350–7.450)
pH, Arterial: 7.474 — ABNORMAL HIGH (ref 7.350–7.450)
pH, Arterial: 7.44 (ref 7.350–7.450)
pH, Arterial: 7.448 (ref 7.350–7.450)
pH, Arterial: 7.422 (ref 7.350–7.450)
pO2, Arterial: 199 mmHg — ABNORMAL HIGH (ref 80.0–100.0)
pO2, Arterial: 74 mmHg — ABNORMAL LOW (ref 80.0–100.0)
pO2, Arterial: 77 mmHg — ABNORMAL LOW (ref 80.0–100.0)
pO2, Arterial: 67 mmHg — ABNORMAL LOW (ref 80.0–100.0)
pO2, Arterial: 85 mmHg (ref 80.0–100.0)

## 2013-11-06 LAB — PROTIME-INR
INR: 1.57 — ABNORMAL HIGH (ref 0.00–1.49)
INR: 1.59 — ABNORMAL HIGH (ref 0.00–1.49)
INR: 1.62 — ABNORMAL HIGH (ref 0.00–1.49)
Prothrombin Time: 18.3 seconds — ABNORMAL HIGH (ref 11.6–15.2)
Prothrombin Time: 18.5 seconds — ABNORMAL HIGH (ref 11.6–15.2)
Prothrombin Time: 18.8 seconds — ABNORMAL HIGH (ref 11.6–15.2)

## 2013-11-06 LAB — CBC
HCT: 35.5 % — ABNORMAL LOW (ref 39.0–52.0)
HCT: 32.4 % — ABNORMAL LOW (ref 39.0–52.0)
HCT: 31.1 % — ABNORMAL LOW (ref 39.0–52.0)
HCT: 28.6 % — ABNORMAL LOW (ref 39.0–52.0)
HCT: 29.3 % — ABNORMAL LOW (ref 39.0–52.0)
HCT: 26.1 % — ABNORMAL LOW (ref 39.0–52.0)
HCT: 25.8 % — ABNORMAL LOW (ref 39.0–52.0)
Hemoglobin: 12.3 g/dL — ABNORMAL LOW (ref 13.0–17.0)
Hemoglobin: 11.4 g/dL — ABNORMAL LOW (ref 13.0–17.0)
Hemoglobin: 11 g/dL — ABNORMAL LOW (ref 13.0–17.0)
Hemoglobin: 10 g/dL — ABNORMAL LOW (ref 13.0–17.0)
Hemoglobin: 10.2 g/dL — ABNORMAL LOW (ref 13.0–17.0)
Hemoglobin: 9.2 g/dL — ABNORMAL LOW (ref 13.0–17.0)
Hemoglobin: 9.2 g/dL — ABNORMAL LOW (ref 13.0–17.0)
MCH: 30 pg (ref 26.0–34.0)
MCH: 29.7 pg (ref 26.0–34.0)
MCH: 29.7 pg (ref 26.0–34.0)
MCH: 29.3 pg (ref 26.0–34.0)
MCH: 29.3 pg (ref 26.0–34.0)
MCH: 29.8 pg (ref 26.0–34.0)
MCH: 30 pg (ref 26.0–34.0)
MCHC: 34.6 g/dL (ref 30.0–36.0)
MCHC: 35.2 g/dL (ref 30.0–36.0)
MCHC: 35.4 g/dL (ref 30.0–36.0)
MCHC: 35 g/dL (ref 30.0–36.0)
MCHC: 34.8 g/dL (ref 30.0–36.0)
MCHC: 35.2 g/dL (ref 30.0–36.0)
MCHC: 35.7 g/dL (ref 30.0–36.0)
MCV: 86.6 fL (ref 78.0–100.0)
MCV: 84.4 fL (ref 78.0–100.0)
MCV: 84.1 fL (ref 78.0–100.0)
MCV: 83.9 fL (ref 78.0–100.0)
MCV: 84.2 fL (ref 78.0–100.0)
MCV: 84.5 fL (ref 78.0–100.0)
MCV: 84 fL (ref 78.0–100.0)
Platelets: 138 10*3/uL — ABNORMAL LOW (ref 150–400)
Platelets: 131 10*3/uL — ABNORMAL LOW (ref 150–400)
Platelets: 121 10*3/uL — ABNORMAL LOW (ref 150–400)
Platelets: 118 10*3/uL — ABNORMAL LOW (ref 150–400)
Platelets: 115 10*3/uL — ABNORMAL LOW (ref 150–400)
Platelets: 88 10*3/uL — ABNORMAL LOW (ref 150–400)
Platelets: 83 10*3/uL — ABNORMAL LOW (ref 150–400)
RBC: 4.1 MIL/uL — ABNORMAL LOW (ref 4.22–5.81)
RBC: 3.84 MIL/uL — ABNORMAL LOW (ref 4.22–5.81)
RBC: 3.7 MIL/uL — ABNORMAL LOW (ref 4.22–5.81)
RBC: 3.41 MIL/uL — ABNORMAL LOW (ref 4.22–5.81)
RBC: 3.48 MIL/uL — ABNORMAL LOW (ref 4.22–5.81)
RBC: 3.09 MIL/uL — ABNORMAL LOW (ref 4.22–5.81)
RBC: 3.07 MIL/uL — ABNORMAL LOW (ref 4.22–5.81)
RDW: 15.5 % (ref 11.5–15.5)
RDW: 15.6 % — ABNORMAL HIGH (ref 11.5–15.5)
RDW: 15.5 % (ref 11.5–15.5)
RDW: 15.7 % — ABNORMAL HIGH (ref 11.5–15.5)
RDW: 16 % — ABNORMAL HIGH (ref 11.5–15.5)
RDW: 16.3 % — ABNORMAL HIGH (ref 11.5–15.5)
RDW: 16.5 % — ABNORMAL HIGH (ref 11.5–15.5)
WBC: 12.3 10*3/uL — ABNORMAL HIGH (ref 4.0–10.5)
WBC: 17.5 10*3/uL — ABNORMAL HIGH (ref 4.0–10.5)
WBC: 15.1 10*3/uL — ABNORMAL HIGH (ref 4.0–10.5)
WBC: 12 10*3/uL — ABNORMAL HIGH (ref 4.0–10.5)
WBC: 11.3 10*3/uL — ABNORMAL HIGH (ref 4.0–10.5)
WBC: 8.8 10*3/uL (ref 4.0–10.5)
WBC: 8.6 10*3/uL (ref 4.0–10.5)

## 2013-11-06 LAB — URINALYSIS, ROUTINE W REFLEX MICROSCOPIC
Glucose, UA: NEGATIVE mg/dL
Hgb urine dipstick: NEGATIVE
Ketones, ur: NEGATIVE mg/dL
Leukocytes, UA: NEGATIVE
Nitrite: NEGATIVE
Protein, ur: 30 mg/dL — AB
Specific Gravity, Urine: 1.028 (ref 1.005–1.030)
Urobilinogen, UA: 1 mg/dL (ref 0.0–1.0)
pH: 5 (ref 5.0–8.0)

## 2013-11-06 LAB — URINE MICROSCOPIC-ADD ON

## 2013-11-06 LAB — GLUCOSE, CAPILLARY
Glucose-Capillary: 151 mg/dL — ABNORMAL HIGH (ref 70–99)
Glucose-Capillary: 108 mg/dL — ABNORMAL HIGH (ref 70–99)
Glucose-Capillary: 81 mg/dL (ref 70–99)
Glucose-Capillary: 85 mg/dL (ref 70–99)
Glucose-Capillary: 85 mg/dL (ref 70–99)

## 2013-11-06 LAB — APTT
aPTT: 32 seconds (ref 24–37)
aPTT: 31 seconds (ref 24–37)

## 2013-11-06 LAB — PREPARE RBC (CROSSMATCH)

## 2013-11-06 LAB — CARBOXYHEMOGLOBIN
Carboxyhemoglobin: 1.9 % — ABNORMAL HIGH (ref 0.5–1.5)
Methemoglobin: 1.1 % (ref 0.0–1.5)
O2 Saturation: 78.4 %
Total hemoglobin: 11.9 g/dL — ABNORMAL LOW (ref 13.5–18.0)

## 2013-11-06 LAB — LACTIC ACID, PLASMA: Lactic Acid, Venous: 5.7 mmol/L — ABNORMAL HIGH (ref 0.5–2.2)

## 2013-11-06 LAB — PROCALCITONIN: Procalcitonin: 2.81 ng/mL

## 2013-11-06 SURGERY — VIDEO ASSISTED THORACOSCOPY
Anesthesia: General | Site: Chest | Laterality: Right

## 2013-11-06 MED ORDER — MAGNESIUM SULFATE 50 % IJ SOLN
40.0000 meq | INTRAMUSCULAR | Status: DC
  Filled 2013-11-06: qty 10

## 2013-11-06 MED ORDER — DOPAMINE-DEXTROSE 3.2-5 MG/ML-% IV SOLN
2.0000 ug/kg/min | INTRAVENOUS | Status: DC
  Filled 2013-11-06: qty 250

## 2013-11-06 MED ORDER — HEPARIN (PORCINE) IN NACL 100-0.45 UNIT/ML-% IJ SOLN
600.0000 [IU]/h | INTRAMUSCULAR | Status: DC
  Administered 2013-11-07: 600 [IU]/h via INTRAVENOUS
  Filled 2013-11-06: qty 250

## 2013-11-06 MED ORDER — MIDAZOLAM HCL 5 MG/5ML IJ SOLN
INTRAMUSCULAR | Status: DC | PRN
  Administered 2013-11-06 (×2): 1 mg via INTRAVENOUS

## 2013-11-06 MED ORDER — CALCIUM CHLORIDE 10 % IV SOLN
1.0000 g | Freq: Once | INTRAVENOUS | Status: AC
  Administered 2013-11-06: 1 g via INTRAVENOUS

## 2013-11-06 MED ORDER — SODIUM CHLORIDE 0.9 % IV BOLUS (SEPSIS): 500.0000 mL | Freq: Once | INTRAVENOUS | Status: DC

## 2013-11-06 MED ORDER — SODIUM CHLORIDE 0.9 % IV SOLN
0.0000 ug/h | INTRAVENOUS | Status: DC
  Administered 2013-11-06: 50 ug/h via INTRAVENOUS
  Administered 2013-11-07: 75 ug/h via INTRAVENOUS
  Filled 2013-11-06 (×3): qty 50

## 2013-11-06 MED ORDER — ETOMIDATE 2 MG/ML IV SOLN
10.0000 mg | Freq: Once | INTRAVENOUS | Status: AC
  Administered 2013-11-05: 10 mg via INTRAVENOUS

## 2013-11-06 MED ORDER — PROPOFOL 10 MG/ML IV BOLUS
INTRAVENOUS | Status: AC
  Filled 2013-11-06: qty 20

## 2013-11-06 MED ORDER — PLASMA-LYTE 148 IV SOLN
INTRAVENOUS | Status: DC
  Filled 2013-11-06: qty 2.5

## 2013-11-06 MED ORDER — EPINEPHRINE HCL 0.1 MG/ML IJ SOSY
1.0000 mg | PREFILLED_SYRINGE | Freq: Once | INTRAMUSCULAR | Status: AC
  Administered 2013-11-05: 1 mg via INTRAVENOUS

## 2013-11-06 MED ORDER — ONDANSETRON HCL 4 MG/2ML IJ SOLN
INTRAMUSCULAR | Status: AC
  Filled 2013-11-06: qty 2

## 2013-11-06 MED ORDER — MIDAZOLAM HCL 2 MG/2ML IJ SOLN
INTRAMUSCULAR | Status: AC
  Filled 2013-11-06: qty 2

## 2013-11-06 MED ORDER — FENTANYL CITRATE 0.05 MG/ML IJ SOLN
INTRAMUSCULAR | Status: DC | PRN
  Administered 2013-11-06: 100 ug via INTRAVENOUS
  Administered 2013-11-06: 150 ug via INTRAVENOUS

## 2013-11-06 MED ORDER — CHLORHEXIDINE GLUCONATE 0.12 % MT SOLN
15.0000 mL | Freq: Two times a day (BID) | OROMUCOSAL | Status: DC
  Administered 2013-11-06 – 2013-11-13 (×15): 15 mL via OROMUCOSAL
  Filled 2013-11-06 (×14): qty 15

## 2013-11-06 MED ORDER — ALBUMIN HUMAN 5 % IV SOLN
12.5000 g | Freq: Once | INTRAVENOUS | Status: AC
  Administered 2013-11-06: 12.5 g via INTRAVENOUS
  Filled 2013-11-06: qty 250

## 2013-11-06 MED ORDER — SODIUM BICARBONATE 8.4 % IV SOLN
100.0000 meq | Freq: Once | INTRAVENOUS | Status: AC
  Administered 2013-11-05: 100 meq via INTRAVENOUS

## 2013-11-06 MED ORDER — ALBUMIN HUMAN 5 % IV SOLN
12.5000 g | Freq: Once | INTRAVENOUS | Status: AC
  Administered 2013-11-06: 12.5 g via INTRAVENOUS

## 2013-11-06 MED ORDER — ROCURONIUM BROMIDE 100 MG/10ML IV SOLN
INTRAVENOUS | Status: DC | PRN
  Administered 2013-11-06: 20 mg via INTRAVENOUS
  Administered 2013-11-06: 30 mg via INTRAVENOUS

## 2013-11-06 MED ORDER — PHENYLEPHRINE HCL 10 MG/ML IJ SOLN
30.0000 ug/min | INTRAVENOUS | Status: DC
  Filled 2013-11-06: qty 2

## 2013-11-06 MED ORDER — LACTATED RINGERS IV SOLN
INTRAVENOUS | Status: DC | PRN
  Administered 2013-11-06: 13:00:00 via INTRAVENOUS

## 2013-11-06 MED ORDER — DEXMEDETOMIDINE HCL IN NACL 400 MCG/100ML IV SOLN
0.1000 ug/kg/h | INTRAVENOUS | Status: DC
  Filled 2013-11-06: qty 100

## 2013-11-06 MED ORDER — PHENYLEPHRINE HCL 10 MG/ML IJ SOLN
INTRAMUSCULAR | Status: AC
  Filled 2013-11-06: qty 1

## 2013-11-06 MED ORDER — BIOTENE DRY MOUTH MT LIQD: 15.0000 mL | Freq: Four times a day (QID) | OROMUCOSAL | Status: DC

## 2013-11-06 MED ORDER — PHENYLEPHRINE 40 MCG/ML (10ML) SYRINGE FOR IV PUSH (FOR BLOOD PRESSURE SUPPORT)
PREFILLED_SYRINGE | INTRAVENOUS | Status: AC
  Filled 2013-11-06: qty 10

## 2013-11-06 MED ORDER — SODIUM CHLORIDE 0.9 % IV SOLN
1.0000 mg/h | INTRAVENOUS | Status: DC
  Administered 2013-11-06 – 2013-11-07 (×2): 1 mg/h via INTRAVENOUS
  Filled 2013-11-06 (×3): qty 10

## 2013-11-06 MED ORDER — DOBUTAMINE-DEXTROSE 2-5 MG/ML-% IV SOLN: INTRAVENOUS | Status: DC | PRN

## 2013-11-06 MED ORDER — NITROGLYCERIN IN D5W 200-5 MCG/ML-% IV SOLN
2.0000 ug/min | INTRAVENOUS | Status: DC
  Filled 2013-11-06: qty 250

## 2013-11-06 MED ORDER — ROCURONIUM BROMIDE 50 MG/5ML IV SOLN
INTRAVENOUS | Status: AC
  Filled 2013-11-06: qty 2

## 2013-11-06 MED ORDER — SODIUM CHLORIDE 0.9 % IV SOLN
INTRAVENOUS | Status: DC
  Filled 2013-11-06: qty 30

## 2013-11-06 MED ORDER — BIOTENE DRY MOUTH MT LIQD
15.0000 mL | OROMUCOSAL | Status: DC
  Administered 2013-11-06 – 2013-11-16 (×53): 15 mL via OROMUCOSAL

## 2013-11-06 MED ORDER — VANCOMYCIN HCL 10 G IV SOLR
1250.0000 mg | INTRAVENOUS | Status: DC
  Filled 2013-11-06: qty 1250

## 2013-11-06 MED ORDER — DEXTROSE 5 % IV SOLN
750.0000 mg | INTRAVENOUS | Status: DC
  Filled 2013-11-06: qty 750

## 2013-11-06 MED ORDER — DEXTROSE 5 % IV SOLN
1.5000 g | INTRAVENOUS | Status: DC
  Filled 2013-11-06: qty 1.5

## 2013-11-06 MED ORDER — EPINEPHRINE HCL 1 MG/ML IJ SOLN
0.5000 ug/min | INTRAVENOUS | Status: DC
  Filled 2013-11-06: qty 4

## 2013-11-06 MED ORDER — PHENYLEPHRINE HCL 10 MG/ML IJ SOLN
10.0000 mg | INTRAVENOUS | Status: DC | PRN
  Administered 2013-11-06: 25 ug via INTRAVENOUS

## 2013-11-06 MED ORDER — ALBUMIN HUMAN 5 % IV SOLN: 12.5000 g | Freq: Once | INTRAVENOUS | Status: AC

## 2013-11-06 MED ORDER — SODIUM CHLORIDE 0.9 % IV SOLN
INTRAVENOUS | Status: DC
  Filled 2013-11-06: qty 1

## 2013-11-06 MED ORDER — FENTANYL CITRATE 0.05 MG/ML IJ SOLN
25.0000 ug | Freq: Once | INTRAMUSCULAR | Status: AC
  Administered 2013-11-05: 25 ug via INTRAVENOUS

## 2013-11-06 MED ORDER — DEXTROSE 5 % IV SOLN
1.5000 g | INTRAVENOUS | Status: AC
  Administered 2013-11-06 (×2): 1.5 g via INTRAVENOUS
  Filled 2013-11-06: qty 1.5

## 2013-11-06 MED ORDER — POTASSIUM CHLORIDE 2 MEQ/ML IV SOLN
80.0000 meq | INTRAVENOUS | Status: DC
  Filled 2013-11-06: qty 40

## 2013-11-06 MED ORDER — ALBUMIN HUMAN 5 % IV SOLN
INTRAVENOUS | Status: DC | PRN
  Administered 2013-11-06: 13:00:00 via INTRAVENOUS

## 2013-11-06 MED ORDER — SODIUM CHLORIDE 0.9 % IV BOLUS (SEPSIS)
500.0000 mL | Freq: Once | INTRAVENOUS | Status: AC
  Administered 2013-11-06: 500 mL via INTRAVENOUS

## 2013-11-06 MED ORDER — GLYCOPYRROLATE 0.2 MG/ML IJ SOLN
INTRAMUSCULAR | Status: AC
  Filled 2013-11-06: qty 4

## 2013-11-06 MED ORDER — NEOSTIGMINE METHYLSULFATE 1 MG/ML IJ SOLN
INTRAMUSCULAR | Status: AC
  Filled 2013-11-06: qty 10

## 2013-11-06 MED ORDER — ACETAMINOPHEN 325 MG RE SUPP
975.0000 mg | Freq: Once | RECTAL | Status: AC
  Administered 2013-11-06: 975 mg via RECTAL
  Filled 2013-11-06 (×2): qty 1

## 2013-11-06 MED ORDER — 0.9 % SODIUM CHLORIDE (POUR BTL) OPTIME
TOPICAL | Status: DC | PRN
  Administered 2013-11-06: 1000 mL

## 2013-11-06 MED ORDER — FE FUMARATE-B12-VIT C-FA-IFC PO CAPS
1.0000 | ORAL_CAPSULE | Freq: Three times a day (TID) | ORAL | Status: DC
  Filled 2013-11-06 (×5): qty 1

## 2013-11-06 MED ORDER — VITAL AF 1.2 CAL PO LIQD
1000.0000 mL | ORAL | Status: DC
  Filled 2013-11-06 (×2): qty 1000

## 2013-11-06 MED ORDER — ALBUMIN HUMAN 5 % IV SOLN
INTRAVENOUS | Status: AC
  Administered 2013-11-06: 12.5 g
  Filled 2013-11-06: qty 250

## 2013-11-06 MED ORDER — DOBUTAMINE IN D5W 4-5 MG/ML-% IV SOLN
2.5000 ug/kg/min | INTRAVENOUS | Status: DC
  Filled 2013-11-06: qty 250

## 2013-11-06 MED ORDER — LACTATED RINGERS IV SOLN
INTRAVENOUS | Status: DC | PRN
  Administered 2013-11-06: 12:00:00 via INTRAVENOUS

## 2013-11-06 MED ORDER — FENTANYL CITRATE 0.05 MG/ML IJ SOLN
INTRAMUSCULAR | Status: AC
  Filled 2013-11-06: qty 5

## 2013-11-06 MED ORDER — SODIUM CHLORIDE 0.9 % IV SOLN
INTRAVENOUS | Status: DC
  Filled 2013-11-06: qty 40

## 2013-11-06 MED ORDER — ROCURONIUM BROMIDE 50 MG/5ML IV SOLN
100.0000 mg | Freq: Once | INTRAVENOUS | Status: AC
  Administered 2013-11-05: 100 mg via INTRAVENOUS

## 2013-11-06 MED FILL — Fentanyl Citrate Inj 0.05 MG/ML: INTRAMUSCULAR | Qty: 0.5 | Status: AC

## 2013-11-06 MED FILL — Medication: Qty: 1 | Status: AC

## 2013-11-06 SURGICAL SUPPLY — 67 items
ADH SKN CLS APL DERMABOND .7 (GAUZE/BANDAGES/DRESSINGS)
BAG DECANTER FOR FLEXI CONT (MISCELLANEOUS) IMPLANT
BLADE SURG 11 STRL SS (BLADE) ×2 IMPLANT
CANISTER SUCTION 2500CC (MISCELLANEOUS) ×2 IMPLANT
CATH KIT ON Q 5IN SLV (PAIN MANAGEMENT) IMPLANT
CATH ROBINSON RED A/P 22FR (CATHETERS) IMPLANT
CATH THORACIC 28FR (CATHETERS) ×2 IMPLANT
CATH THORACIC 36FR (CATHETERS) IMPLANT
CATH THORACIC 36FR RT ANG (CATHETERS) IMPLANT
CONN ST 1/4X3/8  BEN (MISCELLANEOUS) ×1
CONN ST 1/4X3/8 BEN (MISCELLANEOUS) ×1 IMPLANT
CONN Y 3/8X3/8X3/8  BEN (MISCELLANEOUS) ×1
CONN Y 3/8X3/8X3/8 BEN (MISCELLANEOUS) ×1 IMPLANT
CONT SPEC 4OZ CLIKSEAL STRL BL (MISCELLANEOUS) ×4 IMPLANT
COVER SURGICAL LIGHT HANDLE (MISCELLANEOUS) ×4 IMPLANT
DERMABOND ADVANCED (GAUZE/BANDAGES/DRESSINGS)
DERMABOND ADVANCED .7 DNX12 (GAUZE/BANDAGES/DRESSINGS) IMPLANT
DRAIN CHANNEL 32F RND 10.7 FF (WOUND CARE) ×2 IMPLANT
DRAPE LAPAROSCOPIC ABDOMINAL (DRAPES) ×2 IMPLANT
DRAPE WARM FLUID 44X44 (DRAPE) ×2 IMPLANT
DRSG AQUACEL AG ADV 3.5X14 (GAUZE/BANDAGES/DRESSINGS) ×2 IMPLANT
ELECT REM PT RETURN 9FT ADLT (ELECTROSURGICAL) ×2
ELECTRODE REM PT RTRN 9FT ADLT (ELECTROSURGICAL) ×1 IMPLANT
GLOVE ORTHO TXT STRL SZ7.5 (GLOVE) ×4 IMPLANT
GOWN STRL REUS W/ TWL LRG LVL3 (GOWN DISPOSABLE) ×3 IMPLANT
GOWN STRL REUS W/TWL LRG LVL3 (GOWN DISPOSABLE) ×6
IV SOD CHL 0.9% 1000ML (IV SOLUTION) ×4 IMPLANT
KIT BASIN OR (CUSTOM PROCEDURE TRAY) ×2 IMPLANT
KIT ROOM TURNOVER OR (KITS) ×2 IMPLANT
KIT SUCTION CATH 14FR (SUCTIONS) ×2 IMPLANT
NS IRRIG 1000ML POUR BTL (IV SOLUTION) ×4 IMPLANT
PACK CHEST (CUSTOM PROCEDURE TRAY) ×2 IMPLANT
PAD ARMBOARD 7.5X6 YLW CONV (MISCELLANEOUS) ×4 IMPLANT
SEALANT SURG COSEAL 4ML (VASCULAR PRODUCTS) IMPLANT
SOLUTION ANTI FOG 6CC (MISCELLANEOUS) ×2 IMPLANT
SPONGE GAUZE 4X4 12PLY (GAUZE/BANDAGES/DRESSINGS) ×2 IMPLANT
SPONGE GAUZE 4X4 12PLY STER LF (GAUZE/BANDAGES/DRESSINGS) ×2 IMPLANT
SPONGE TONSIL 1.25 RF SGL STRG (GAUZE/BANDAGES/DRESSINGS) ×2 IMPLANT
STAPLER VISISTAT 35W (STAPLE) ×2 IMPLANT
SUT CHROMIC 3 0 SH 27 (SUTURE) IMPLANT
SUT ETHILON 3 0 PS 1 (SUTURE) IMPLANT
SUT PROLENE 3 0 SH DA (SUTURE) IMPLANT
SUT PROLENE 4 0 RB 1 (SUTURE)
SUT PROLENE 4-0 RB1 .5 CRCL 36 (SUTURE) IMPLANT
SUT SILK  1 MH (SUTURE) ×2
SUT SILK 1 MH (SUTURE) ×2 IMPLANT
SUT SILK 2 0SH CR/8 30 (SUTURE) IMPLANT
SUT SILK 3 0SH CR/8 30 (SUTURE) IMPLANT
SUT VIC AB 1 CTX 18 (SUTURE) IMPLANT
SUT VIC AB 2 TP1 27 (SUTURE) IMPLANT
SUT VIC AB 2-0 CT2 18 VCP726D (SUTURE) IMPLANT
SUT VIC AB 2-0 CTX 36 (SUTURE) IMPLANT
SUT VIC AB 3-0 SH 18 (SUTURE) ×2 IMPLANT
SUT VIC AB 3-0 X1 27 (SUTURE) ×4 IMPLANT
SUT VICRYL 0 UR6 27IN ABS (SUTURE) IMPLANT
SUT VICRYL 2 TP 1 (SUTURE) IMPLANT
SWAB COLLECTION DEVICE MRSA (MISCELLANEOUS) IMPLANT
SYSTEM SAHARA CHEST DRAIN ATS (WOUND CARE) ×2 IMPLANT
TAPE CLOTH SURG 4X10 WHT LF (GAUZE/BANDAGES/DRESSINGS) ×2 IMPLANT
TIP APPLICATOR SPRAY EXTEND 16 (VASCULAR PRODUCTS) IMPLANT
TOWEL OR 17X24 6PK STRL BLUE (TOWEL DISPOSABLE) ×2 IMPLANT
TOWEL OR 17X26 10 PK STRL BLUE (TOWEL DISPOSABLE) ×4 IMPLANT
TRAP SPECIMEN MUCOUS 40CC (MISCELLANEOUS) IMPLANT
TRAY FOLEY CATH 16FRSI W/METER (SET/KITS/TRAYS/PACK) ×2 IMPLANT
TUBE ANAEROBIC SPECIMEN COL (MISCELLANEOUS) IMPLANT
WATER STERILE IRR 1000ML POUR (IV SOLUTION) ×4 IMPLANT
YANKAUER SUCT BULB TIP NO VENT (SUCTIONS) ×2 IMPLANT

## 2013-11-06 NOTE — Anesthesia Procedure Notes (Signed)
Procedure Name: Intubation Date/Time: 11/06/2013 12:18 PM Performed by: Neldon Newport Pre-anesthesia Checklist: Patient identified, Timeout performed, Emergency Drugs available, Patient being monitored and Suction available Patient Re-evaluated:Patient Re-evaluated prior to inductionOxygen Delivery Method: Circle system utilized Preoxygenation: Pre-oxygenation with 100% oxygen Endobronchial tube: Left, EBT position confirmed by fiberoptic bronchoscope and EBT position confirmed by auscultation and 39 Fr Airway Equipment and Method: Fiberoptic brochoscope Placement Confirmation: positive ETCO2 and breath sounds checked- equal and bilateral Tube secured with: Tape Dental Injury: Teeth and Oropharynx as per pre-operative assessment

## 2013-11-06 NOTE — Progress Notes (Addendum)
NUTRITION CONSULT/FOLLOW UP  INTERVENTION: Initiate Vital AF 1.2 formula at 20 ml/hr and increase by 10 ml every 4 hours to goal rate of 60 ml/hr to provide 1728 kcals, 108 gm protein, 1168 ml of free water RD to follow for nutrition care plan  NUTRITION DIAGNOSIS: Inadequate oral intake now related to inability to eat as evidenced by NPO status, ongoing  Goal: Pt to meet >/= 90% of their estimated nutrition needs, currently unmet  Monitor:  TF regimen & tolerance, respiratory status, weight, labs, I/O's  ASSESSMENT: Patient is s/p LVAD on 09/19/2013; sent to the ED b/c of episodes of nausea vomiting and abdominal pain.   CT of abdomen/pelvis: dilated fluid and gas-filled stomach. Contrast extends into nondilated duodenum. Beyond this region, fluid and gas-filled dilated loops of small bowel consistent with SBO.   Patient s/p procedure 3/9: EMERGENT RIGHT CHEST TUBE PLACEMENT  Patient is currently intubated on ventilator support -- NGT in place MV: 10.1 L/min Temp (24hrs), Avg:98.2 F (36.8 C), Min:97.7 F (36.5 C), Max:98.7 F (37.1 C)   TPN discontinued 3/8.  CT ABD 3/9 showed minimal cholelithiasis; sigmoid diverticulosis without inflammation.  RD consulted for TF initiation & management.  Height: Ht Readings from Last 1 Encounters:  11/06/13 5\' 9"  (1.753 m)    Weight ----> stable Wt Readings from Last 1 Encounters:  11/06/13 151 lb 1.6 oz (68.539 kg)    3/08  151 lb 3/07  156 lb 3/06  156 lb 3/05  151 lb 3/04  151 lb    BMI:  Body mass index is 22.3 kg/(m^2).  Re-estimated needs: Kcal: 1700-1800 Protein: 105-115 gm Fluid: per MD  Skin: Intact  Diet Order: NPO   Intake/Output Summary (Last 24 hours) at 11/06/13 1054 Last data filed at 11/06/13 1000  Gross per 24 hour  Intake 4096.83 ml  Output   2765 ml  Net 1331.83 ml    Labs:   Recent Labs Lab 11/02/13 0355 11/02/13 0500 11/03/13 0742  11/06/13 0132 11/06/13 0500 11/06/13 0855  NA  137 137 135*  < > 137 135* 137  K 4.3 4.1 4.1  < > 3.8 3.9 4.0  CL 102 101 100  < > 98 97 100  CO2 22 23 25   < > 18* 21 23  BUN 14 15 20   < > 29* 31* 32*  CREATININE 1.12 1.21 1.06  < > 1.47* 1.51* 1.46*  CALCIUM 8.8 8.6 8.7  < > 8.7 8.4 8.7  MG 2.1 2.0 2.1  --   --   --   --   PHOS 3.5 4.0 3.8  --   --   --   --   GLUCOSE 117* 96 119*  < > 261* 169* 144*  < > = values in this interval not displayed.  Scheduled Meds: . amiodarone (NEXTERONE PREMIX) 360 mg/200 mL dextrose      . antiseptic oral rinse  15 mL Mouth Rinse Q4H  . cefUROXime (ZINACEF)  IV  1.5 g Intravenous 60 min Pre-Op  . chlorhexidine  15 mL Mouth Rinse BID  . fentaNYL      . insulin aspart  0-9 Units Subcutaneous TID WC  . levothyroxine  100 mcg Intravenous Daily  . pantoprazole (PROTONIX) IV  40 mg Intravenous Daily  . piperacillin-tazobactam (ZOSYN)  IV  3.375 g Intravenous 3 times per day  . sodium chloride  500 mL Intravenous Once  . sodium chloride  10-40 mL Intracatheter Q12H  . vancomycin  1,000  mg Intravenous Q24H    Continuous Infusions: . epinephrine    . fentaNYL infusion INTRAVENOUS 75 mcg/hr (11/06/13 1000)  . midazolam (VERSED) infusion 1 mg/hr (11/06/13 1000)  . milrinone Stopped (11/05/13 2120)  . norepinephrine (LEVOPHED) Adult infusion 9 mcg/min (11/06/13 1000)  .  sodium bicarbonate  infusion 1000 mL Stopped (11/06/13 0930)  . vasopressin (PITRESSIN) infusion - *FOR SHOCK* 0.03 Units/min (11/06/13 1000)    Past Medical History  Diagnosis Date  . Ischemic cardiomyopathy     a. 10/09 Echo: Sev LV Dysfxn, inf/lat AK, Mod MR.  [name];  s/p Lat MI 2009 with occlusion of the LCX - treated with Promus stenting. Has total occlusion of the RCA.   . ICD (implantable cardiac defibrillator) in place     PROPHYLACTIC      medtronic  . RBBB   . Inferior  MI "? date"; 2009  . Hypertension   . PVC (premature ventricular contraction)   . Pacemaker   . CHF (congestive heart failure)   . Shortness of breath   . Automatic implantable cardioverter-defibrillator in situ     Past Surgical History  Procedure Laterality Date  . Defibrillator  2009  . Total knee arthroplasty  11/27/11    left  . Insert / replace / remove pacemaker  2009  . Coronary angioplasty with stent placement  2004  . Coronary angioplasty with stent placement  07/2008  . Knee arthroscopy w/ partial medial meniscectomy  12/2002    left  . Knee arthroscopy      bilaterally  . Total knee arthroplasty  11/27/2011    Procedure: TOTAL KNEE ARTHROPLASTY;  Surgeon: Yvette Rack., MD;  Location: [facility];  Service: Orthopedics;  Laterality: Left;  left total knee arthroplasty  . Esophagogastroduodenoscopy N/A 09/15/2013    Procedure: ESOPHAGOGASTRODUODENOSCOPY (EGD);  Surgeon: Ladene Artist, MD;  Location: [facility] ENDOSCOPY;  Service: Endoscopy;  Laterality: N/A;  bedside  . Insertion of implantable left ventricular assist device N/A 09/18/2013    Procedure: INSERTION OF IMPLANTABLE LEFT VENTRICULAR ASSIST DEVICE;  Surgeon: Ivin Poot, MD;  Location: [place];  Service: Open Heart Surgery;  Laterality: N/A;  CIRC ARREST  NITRIC OXIDE  . Intraoperative transesophageal echocardiogram N/A 09/18/2013    Procedure: INTRAOPERATIVE TRANSESOPHAGEAL ECHOCARDIOGRAM;  Surgeon: Ivin Poot, MD;  Location: [place];  Service: Open Heart Surgery;  Laterality: N/A;    Arthur Holms, RD, LDN Pager #: [phone] After-Hours Pager #: [phone]

## 2013-11-06 NOTE — Progress Notes (Signed)
In to see patient at this time, at the current time the patient continues to have low CVP's ranging from 2-3 s/p 2 Albumin given prior during day shift. MD Bensimhon notified of patients VAD numbers, most recent CBC results, and current vital signs. New orders received at this time, will continue to monitor the patient closely.

## 2013-11-06 NOTE — Transfer of Care (Signed)
Immediate Anesthesia Transfer of Care Note  Patient: Johnny Oneill  Procedure(s) Performed: Procedure(s): VIDEO ASSISTED THORACOSCOPY (Right) EVACUATION HEMATOMA (Right)  Patient Location: PACU  Anesthesia Type:General  Level of Consciousness: Patient remains intubated per anesthesia plan  Airway & Oxygen Therapy: Patient remains intubated per anesthesia plan and Patient placed on Ventilator (see vital sign flow sheet for setting)  Post-op Assessment: Report given to PACU RN  Post vital signs: Reviewed and stable  Complications: No apparent anesthesia complications

## 2013-11-06 NOTE — Progress Notes (Signed)
11/06/13 patient came back from [place]

## 2013-11-06 NOTE — Op Note (Signed)
Procedure-emergency right chest tube placement  Pre-and postop diagnosis-right hemothorax with hypotension and cardiac arrest  Surgeon-Peter Lucianne Lei trigt M.D.  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 He was recently admitted with partial small bowel obstruction. He is originally started on IV heparin because of low INR-Coumadin by mouth was being held. He developed acute decompensation with drop in hemoglobin and required intubation and acute resuscitation. Chest x-ray showed white out of the right hemithorax which is new from his previous x-ray. I obtained informed consent from the patient's wife prior to placing right chest tube  Procedure  The patient was quickly prepped and draped as a sterile field a proper timeout was performed. 1% local lidocaine was infiltrated in the sixth interspace. An incision was made and using a hemostat the right pleural space was entered and bloody fluid under pressure immediately exited. A 40 French straight chest tube was placed posteriorly in the pleural space and connected to a Pleur-evac. Over 1 L of bloody fluid was removed. The chest tube was secured to the skin with silk sutures a sterile dressing was applied. A followup chest x-ray showed improvement in the right hemithorax with chest tube in good position.

## 2013-11-06 NOTE — Progress Notes (Signed)
PULMONARY  / CRITICAL CARE MEDICINE  Name: Johnny Oneill MRN: [id number] DOB: 09-26-1940    ADMISSION DATE:  11/01/2013 CONSULTATION DATE:  3/8  PRIMARY SERVICE: Cardiolgoy  CHIEF COMPLAINT:  Cardiogenic Shock  BRIEF PATIENT DESCRIPTION: 39 male with LVAD implanted 09/19/2013 with onset of shock, likely due to hemorrhage, necessitating intubation  SIGNIFICANT EVENTS / STUDIES:  3/8 CXR with with complete opacification of right hemothorax 3/9 improvement of right hemothorax s/p chest tube.  ET tube in place 3/9 CT Chest abd pelvis Endotracheal tube in grossly good position. Ventricular assistance device and left-sided pacemaker are noted. Right-sided chest tube has been placed with distal tip directed toward posterior portion of chest. Large right pleural effusion is noted with high density material layering within it concerning for hemorrhage  LINES / TUBES: [facility] CVL 3/5 Right Femoral CVL 3/5 Right Femoral A line 3/5  CULTURES: Pending blood cultures  ANTIBIOTICS: Zosyn 3/5>>> [place]in 3/5>>>  SUBJECTIVE: Sedated and intubated, arrest overnight.  VITAL SIGNS: Temp:  [97.7 F (36.5 C)-98.7 F (37.1 C)] 98.2 F (36.8 C) (03/09 0800) Pulse Rate:  [29-106] 71 (03/09 0930) Resp:  [0-34] 0 (03/09 0930) SpO2:  [88 %-100 %] 100 % (03/09 0930) Arterial Line BP: (75-142)/(57-93) 95/80 mmHg (03/09 0930) FiO2 (%):  [60 %-100 %] 60 % (03/09 0823) Weight:  [68.539 kg (151 lb 1.6 oz)] 68.539 kg (151 lb 1.6 oz) (03/08 1400)  HEMODYNAMICS: CVP:  [5 mmHg-24 mmHg] 12 mmHg  VENTILATOR SETTINGS: Vent Mode:  [-] PRVC FiO2 (%):  [60 %-100 %] 60 % Set Rate:  [20 bmp] 20 bmp Vt Set:  [500 mL-560 mL] 500 mL PEEP:  [5 cmH20] 5 cmH20 Plateau Pressure:  [22 cmH20-26 cmH20] 25 cmH20  INTAKE / OUTPUT: Intake/Output     03/08 0701 - 03/09 0700 03/09 0701 - 03/10 0700   P.O. 240    I.V. (mL/kg) 1280.5 (18.7) 200.4 (2.9)   Blood 1678    IV Piggyback 300 250   TPN 722    Total  Intake(mL/kg) 4220.5 (61.6) 450.4 (6.6)   Urine (mL/kg/hr) 1470 (0.9) 60 (0.3)   Chest Tube 1200 (0.7) 20 (0.1)   Total Output 2670 80   Net +1550.5 +370.4         PHYSICAL EXAMINATION: General: Chronically ill appearing male, sedated. Neuro: Sedated and intubated, moves all ext to pain. HEENT: [place]/AT, PERRL, EOMI, pale conjunctiva. Cardiovascular: RRR, Nl S1/S2, -M/R/G. Lungs: Coarse BS diffusely. Abdomen: Soft, NT, ND and +BS. Musculoskeletal:  No lower extremity edema, unable to palpate pulses Skin:  Pale, no ecchymosis, no evidence of retropertoneal bleed  LABS:  Recent Labs Lab 11/01/13 1033 11/01/13 1812 11/01/13 1838 11/02/13 0355 11/02/13 0500  11/03/13 0742  11/05/13 2122  11/06/13 0132 11/06/13 0156 11/06/13 0201 11/06/13 0500 11/06/13 0529 11/06/13 0535 11/06/13 0800 11/06/13 0811 11/06/13 0855  HGB 12.3* 12.5*  --  12.5* 10.9*  --  10.8*  < >  --   < > 12.3*  --   --  11.4*  --   --  11.0*  --   --   WBC 7.3 10.7*  --  8.0 8.5  --  8.7  < >  --   < > 12.3*  --   --  17.5*  --   --  15.1*  --   --   PLT 251 234  --  243 240  --  248  < >  --   < > 138*  --   --  131*  --   --  121*  --   --   NA 139 139  --  137 137  --  135*  < >  --   < > 137  --   --  135*  --   --   --   --  137  K 4.4 4.3  --  4.3 4.1  --  4.1  < >  --   < > 3.8  --   --  3.9  --   --   --   --  4.0  CL 102 102  --  102 101  --  100  < >  --   < > 98  --   --  97  --   --   --   --  100  CO2 24 23  --  22 23  --  25  < >  --   < > 18*  --   --  21  --   --   --   --  23  GLUCOSE 99 116*  --  117* 96  --  119*  < >  --   < > 261*  --   --  169*  --   --   --   --  144*  BUN 16 17  --  14 15  --  20  < >  --   < > 29*  --   --  31*  --   --   --   --  32*  CREATININE 1.37* 1.37*  --  1.12 1.21  --  1.06  < >  --   < > 1.47*  --   --  1.51*  --   --   --   --  1.46*  CALCIUM 8.6 8.8  --  8.8 8.6  --  8.7  < >  --   < > 8.7  --   --  8.4  --   --   --   --  8.7  MG  --   --   --  2.1 2.0  --   2.1  --   --   --   --   --   --   --   --   --   --   --   --   PHOS  --   --   --  3.5 4.0  --  3.8  --   --   --   --   --   --   --   --   --   --   --   --   AST 36 36  --  31 32  --  35  < >  --   --  538*  --   --  570*  --   --   --   --  490*  ALT 27 28  --  23 22  --  24  < >  --   --  293*  --   --  332*  --   --   --   --  312*  ALKPHOS 134* 134*  --  118* 101  --  102  < >  --   --  102  --   --  95  --   --   --   --  89  BILITOT 0.5 0.6  --  0.6 0.7  --  0.4  < >  --   --  1.3*  --   --  1.4*  --   --   --   --  1.1  PROT 7.7 7.6  --  7.3 6.8  --  7.0  < >  --   --  6.3  --   --  5.8*  --   --   --   --  5.8*  ALBUMIN 3.1* 3.3*  --  3.0* 2.9*  --  2.8*  < >  --   --  2.6*  --   --  2.6*  --   --   --   --  2.6*  APTT  --   --   --   --   --   --   --   --   --   --   --   --   --   --   --   --  32  --  31  INR 3.74* 3.56*  --  3.15*  --   < > 2.09*  < >  --   --  1.57*  --   --  1.59*  --   --   --   --  1.62*  LATICACIDVEN  --   --  1.31  --   --   --   --   --  3.2*  --   --  5.7*  --   --   --   --   --   --   --   TROPONINI  --  <0.30  --   --   --   --   --   --   --   --   --   --   --   --   --   --   --   --   --   PROCALCITON  --   --   --   --   --   --   --   --   --   --   --   --   --   --   --  2.81  --   --   --   PROBNP 877.6*  --   --   --   --   --   --   --   --   --   --   --   --   --   --   --   --   --   --   PHART  --   --   --   --   --   --   --   --   --   < >  --   --  7.386  --  7.474*  --   --  7.440  --   PCO2ART  --   --   --   --   --   --   --   --   --   < >  --   --  36.3  --  31.1*  --   --  35.5  --   PO2ART  --   --   --   --   --   --   --   --   --   < >  --   --  199.0*  --  74.0*  --   --  77.0*  --   < > = values in this interval not displayed.  Recent Labs Lab 11/04/13 2309 11/05/13 0608 11/05/13 1201 11/05/13 1642 11/06/13 0826  GLUCAP 118* 127* 118* 131* 151*    CXR: Patient with right sided chest tube in place, ET tube in  place, cardiomegaly, right subclavian line in place, noted right hemi opacity (improvment)  CT chest Complicated effusion, multiple denisties with layering consistent with blood in right thorax.  No gas pockets in fluid collection  ASSESSMENT / PLAN:  PULMONARY A:  Respiratory Failure Due to Cardiogenic/Hemorrhagic shock  P:   - Acidosis resolved - D/C bicarb drip and decrease RR to 16. - CXR and ABG in AM. - VATS today per CVTS. - Continue full vent support. - Avoid dyssynchrony. - ET tube size: 7.5.  CARDIOVASCULAR A: Cardiogenic/Hemorrhagic Shock LVAD in place functioning appropriately with good flow Norepinephrine requirements trending downward with vasopressin supplementation (does not constrict pulmonary vasculature in a patient with known RVHF) P:  - Management as per primary team. - Levophed at 10 and vasopressin at 0.03.  RENAL A:  AKI Acute renal failure Pre-Renal from hemorrhagic shock/Cardiogenic Shock  P: - Renal dose medications. - D/C Bicarb drip. - Maintain hemodynamics as able to preserve renal function.  GASTROINTESTINAL A:  Stress Ulcer Prophylaxis P:   - Protonix in place - TF per nutrition.  HEMATOLOGIC A:  Hemorrhagic shock Transfusion with FFP, partial reversal of anticoagulation and holding coumadin (risk of bleeding vs LVAD circuit clotting) P:  - Daily CBC.  INFECTIOUS A:  Possible sepsis Multiple sites of possible infection with CVL, LVAD, Urine, Lung with persistent opacity (although likely just evolving bleed) P:   - Zosyn/Vanc as ordered. - De-escalate antibiotics as cultures result.  ENDOCRINE A:  Glucose control   P:   - SSI in place  NEUROLOGIC A:  Hx of Delerium P:   - Versed and fentanyl drips as ordered.  I have personally obtained a history, examined the patient, evaluated laboratory and imaging results, formulated the assessment and plan and placed orders.  CRITICAL CARE: The patient is critically ill with  multiple organ systems failure and requires high complexity decision making for assessment and support, frequent evaluation and titration of therapies, application of advanced monitoring technologies and extensive interpretation of multiple databases. Critical Care Time devoted to patient care services described in this note is 45 minutes.   Rush Farmer, M.D. [facility] Pulmonary/Critical Care Medicine. Pager: [phone]. After hours pager: [phone].

## 2013-11-06 NOTE — Progress Notes (Signed)
  Subjective: Patient intubated but hemodynamically stable after right hemothorax with tension on right atrium with cardiac arrest  Chest x-ray shows evacuation of some of the hemothorax but with some residual hematoma as well as some reperfusion edema.  Objective: Vital signs in last 24 hours: Temp:  [97.7 F (36.5 C)-98.7 F (37.1 C)] 98.1 F (36.7 C) (03/09 0600) Pulse Rate:  [29-106] 73 (03/09 0715) Cardiac Rhythm:  [-] Normal sinus rhythm;Bundle branch block (03/09 0600) Resp:  [0-34] 14 (03/09 0715) SpO2:  [88 %-100 %] 100 % (03/09 0715) Arterial Line BP: (75-142)/(57-93) 87/77 mmHg (03/09 0715) FiO2 (%):  [60 %-100 %] 60 % (03/09 0610) Weight:  [151 lb 1.6 oz (68.539 kg)] 151 lb 1.6 oz (68.539 kg) (03/08 1400)  Hemodynamic parameters for last 24 hours: CVP:  [5 mmHg-24 mmHg] 10 mmHg  Intake/Output from previous day: 03/08 0701 - 03/09 0700 In: 4563.5 [P.O.:240; I.V.:1623.5; WG:[id number]; IV Piggyback:300; TPN:722] Out: 2560 [id number]; Chest Tube:1120] Intake/Output this shift:    Coarse breath sounds Extremities warm Abdomen slightly distended Patient sedated  Lab Results:  Recent Labs  11/06/13 0132 11/06/13 0500  WBC 12.3* 17.5*  HGB 12.3* 11.4*  HCT 35.5* 32.4*  PLT 138* 131*   BMET:  Recent Labs  11/06/13 0132 11/06/13 0500  NA 137 135*  K 3.8 3.9  CL 98 97  CO2 18* 21  GLUCOSE 261* 169*  BUN 29* 31*  CREATININE 1.47* 1.51*  CALCIUM 8.7 8.4    PT/INR:  Recent Labs  11/06/13 0500  LABPROT 18.5*  INR 1.59*   ABG    Component Value Date/Time   PHART 7.474* 11/06/2013 0529   HCO3 22.9 11/06/2013 0529   TCO2 24 11/06/2013 0529   ACIDBASEDEF 3.0* 11/06/2013 0201   O2SAT 96.0 11/06/2013 0529   CBG (last 3)   Recent Labs  11/05/13 0608 11/05/13 1201 11/05/13 1642  GLUCAP 127* 118* 131*    Assessment/Plan: S/P   We'll check chest x-ray at mid day If there is significant hematoma-clot remaining in the right chest will take the patient  back to the OR for evacuation of hematoma using double-lumen ET tube   LOS: 5 days    VAN TRIGT III,Claressa Hughley 11/06/2013

## 2013-11-06 NOTE — Consult Note (Addendum)
PULMONARY  / CRITICAL CARE MEDICINE  Name: Johnny Oneill MRN: [id number] DOB: 1941-01-14    ADMISSION DATE:  11/01/2013 CONSULTATION DATE:  3/8  PRIMARY SERVICE: Cardiolgoy  CHIEF COMPLAINT:  Cardiogenic Shock  BRIEF PATIENT DESCRIPTION: 73 male with LVAD implanted 09/19/2013 with onset of shock, likely due to hemorrhage, necessitating intubation  SIGNIFICANT EVENTS / STUDIES:  3/8 CXR with with complete opacification of right hemothorax 3/9 improvement of right hemothorax s/p chest tube.  ET tube in place 3/9 CT Chest abd pelvis Endotracheal tube in grossly good position. Ventricular assistance device and left-sided pacemaker are noted. Right-sided chest tube has been placed with distal tip directed toward posterior portion of chest. Large right pleural effusion is noted with high density material layering within it concerning for hemorrhage  LINES / TUBES: [facility] CVL 3/5 Right Femoral CVL 3/5 Right Femoral A line 3/5  CULTURES: Pending blood cultures  ANTIBIOTICS: Zosyn/Vancomcyin  HISTORY OF PRESENT ILLNESS:  77 male with LVAD implanted 09/19/2013 with onset of shock, likely due to hemorrhage, necessitating intubation.    Called to bedside due to deteriorating respiratory status, usage of acessory muscle usage, pH 7.15 with Co2 39, and cxr with evidence of hemothorax and drop of hemoglobin from 10.1 to 6.7 despite transfusion.  During the day, patient noted to have a drop in ScvO2 51 with rising CVP.    Intubated without difficult with minimal sedation (10 etomidate 25 mcg fentanyl) on first pass.  Given 2 amps of bicarb due to severe acidosis.  Placed on ventilator with subsequent loss of pulsatile flow on pump, limited ACLS started with ROSC following 1 amp of epi.  Following this 1 amp of calcium given.      During the day, patient noted to have a drop in ScvO2 51 with rising CVP, feeling dizzy and complaing of "just not feeling good."  LVAD numbers at this time within limits.   Precipitous drop in CVP, declining hemoglobin, and drop in MAP necessitating initiating of levophed, IVF crystalloid bolus.  Milrinone at this time held.  Patient with aggressive transfusion of blood products with accumulating evidence that he was bleeding into his chest with further workup ongoing.  Improving MAP and declining pressor requirements with blood product transfusion (PRBC and FFP).  Please see the above note for further details.  PMH of CAD s/p stent to LCX 2004 with re-occlusion of LCX with lateral MI 2009 and repeat stenting, total occlusion of RCA, ICM, chronic systolic heart failure, hypothyroidism, HLD and arthritis. S/P LVAD HMII 09/19/2013 currently [facility] Transplant list .   Patient initially admitted 11/01/13 due to syncopal episode with recorded orthostatic hypotension.   Improved with 1 liter of NS, discharged from clinic, however patient lethargic with vomiting.  No history of LVAD malfunction or alarms, no ICD defibrillation.  With further work up, found to have partial small bowel obstruction with nausea and vomiting (likely due to low flow state with hypovolemia).  At the time of the above event, patient passing gas and having BM, undergoing treatment for skin wound with ceftriaxone and CXR 3/6 showing ? RUL infiltrate that persisted on subsequent CXR.  Afebrile during hospitalization, stable maps until tonight, with adequate uop.  On heparin gtt (on coumadin at home) for LVAD  PAST MEDICAL HISTORY :  Past Medical History  Diagnosis Date  . Ischemic cardiomyopathy     a. 10/09 Echo: Sev LV Dysfxn, inf/lat AK, Mod MR.  [name] Hypercholesteremia   . Osteoarthritis     a.  s/p R TKR  . Anxiety   . Hypothyroidism   . CAD (coronary artery disease)     S/P stenting of LCX in 2004;  s/p Lat MI 2009 with occlusion of the LCX - treated with Promus stenting. Has total occlusion of the RCA.   . ICD (implantable cardiac defibrillator) in place     PROPHYLACTIC      medtronic  . RBBB   .  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 Hypertension   . PVC (premature ventricular contraction)   . Pacemaker   . CHF (congestive heart failure)   . Shortness of breath   . Automatic implantable cardioverter-defibrillator in situ    Past Surgical History  Procedure Laterality Date  . Defibrillator  2009  . Total knee arthroplasty  11/27/11    left  . Insert / replace / remove pacemaker  2009  . Coronary angioplasty with stent placement  2004  . Coronary angioplasty with stent placement  07/2008  . Knee arthroscopy w/ partial medial meniscectomy  12/2002    left  . Knee arthroscopy      bilaterally  . Total knee arthroplasty  11/27/2011    Procedure: TOTAL KNEE ARTHROPLASTY;  Surgeon: Yvette Rack., MD;  Location: [place];  Service: Orthopedics;  Laterality: Left;  left total knee arthroplasty  . Esophagogastroduodenoscopy N/A 09/15/2013    Procedure: ESOPHAGOGASTRODUODENOSCOPY (EGD);  Surgeon: Ladene Artist, MD;  Location: [facility] ENDOSCOPY;  Service: Endoscopy;  Laterality: N/A;  bedside  . Insertion of implantable left ventricular assist device N/A 09/18/2013    Procedure: INSERTION OF IMPLANTABLE LEFT VENTRICULAR ASSIST DEVICE;  Surgeon: Ivin Poot, MD;  Location: [place];  Service: Open Heart Surgery;  Laterality: N/A;  CIRC ARREST  NITRIC OXIDE  . Intraoperative transesophageal echocardiogram N/A 09/18/2013    Procedure: INTRAOPERATIVE TRANSESOPHAGEAL ECHOCARDIOGRAM;  Surgeon: Ivin Poot, MD;  Location: [place];  Service: Open Heart Surgery;  Laterality: N/A;   Prior to Admission medications   Medication Sig Start Date End Date Taking? Authorizing Provider  amiodarone (PACERONE) 200 MG tablet Take 1 tablet (200 mg total) by mouth 2 (two) times daily. 09/08/13  Yes Shaune Pascal Bensimhon, MD  amLODipine (NORVASC) 10 MG tablet Take 1 tablet (10 mg total) by mouth daily. 10/03/13  Yes Rande Brunt, NP  aspirin 325 MG tablet Take 325 mg by mouth daily.     Yes Historical Provider, MD  cephALEXin  (KEFLEX) 500 MG capsule Take 500 mg by mouth 2 (two) times daily. 5 day course filled 11/01/12   Yes Historical Provider, MD  Coenzyme Q10 (COQ10) 200 MG CAPS Take 200 mg by mouth daily.   Yes Historical Provider, MD  ezetimibe (ZETIA) 10 MG tablet Take 10 mg by mouth daily.   Yes Historical Provider, MD  ferrous [id number] C-folic acid (TRINSICON / FOLTRIN) capsule Take 1 capsule by mouth every morning. 10/03/13  Yes Rande Brunt, NP  ibuprofen (ADVIL,MOTRIN) 200 MG tablet Take 200 mg by mouth daily as needed (pain).   Yes Historical Provider, MD  levothyroxine (SYNTHROID, LEVOTHROID) 150 MCG tablet Take 75-150 mcg by mouth daily before breakfast. Take 1 tablet (150 mcg) daily on an empty stomach EXCEPT  take 1/2 tablet (75 mcg) every Friday 03/16/13  Yes Elayne Snare, MD  nitroGLYCERIN (NITROSTAT) 0.4 MG SL tablet Place 0.4 mg under the tongue every 5 (five) minutes as needed for chest pain.    Yes Historical Provider, MD  Omega-3 300 MG  CAPS Take 300 mg by mouth at bedtime.    Yes Historical Provider, MD  pantoprazole (PROTONIX) 40 MG tablet Take 40 mg by mouth daily.   Yes Historical Provider, MD  rosuvastatin (CRESTOR) 20 MG tablet Take 20 mg by mouth daily.   Yes Historical Provider, MD  warfarin (COUMADIN) 5 MG tablet Take 5 mg by mouth daily at 6 PM. Take 1/2 tablet (2.5 mg) on Tues, Thurs, Saturday, take 1 tablet (5 mg) on Sunday, Monday, Wednesday, Friday   Yes Historical Provider, MD  clindamycin (CLEOCIN) 150 MG capsule Take 600 mg by mouth once.  10/29/13   Historical Provider, MD   Allergies  Allergen Reactions  . Diovan [Valsartan] Other (See Comments)    Hypotension at low dose, hyperkalemia  . Lipitor [Atorvastatin Calcium] Other (See Comments)    Muscle pain    FAMILY HISTORY:  Family History  Problem Relation Age of Onset  . Heart failure Father   . Stroke Father   . Coronary artery disease Mother   . Heart disease Mother   . Hyperlipidemia Sister    SOCIAL  HISTORY:  reports that he has never smoked. He has never used smokeless tobacco. He reports that he drinks alcohol. He reports that he does not use illicit drugs.  REVIEW OF SYSTEMS:  Unable to review due to duress   SUBJECTIVE:   VITAL SIGNS: Temp:  [97.7 F (36.5 C)-98.7 F (37.1 C)] 97.7 F (36.5 C) (03/09 0200) Pulse Rate:  [29-106] 82 (03/09 0415) Resp:  [0-34] 20 (03/09 0415) SpO2:  [88 %-100 %] 98 % (03/09 0415) Arterial Line BP: (75-142)/(57-92) 103/84 mmHg (03/09 0415) FiO2 (%):  [60 %-100 %] 60 % (03/09 0300) Weight:  [151 lb 1.6 oz (68.539 kg)] 151 lb 1.6 oz (68.539 kg) (03/08 1400) HEMODYNAMICS: CVP:  [5 mmHg-24 mmHg] 10 mmHg VENTILATOR SETTINGS: Vent Mode:  [-] PRVC FiO2 (%):  [60 %-100 %] 60 % Set Rate:  [20 bmp] 20 bmp Vt Set:  [560 mL] 560 mL PEEP:  [5 cmH20] 5 cmH20 INTAKE / OUTPUT: Intake/Output     03 /08 0701 - 03/09 0700   P.O. 240   I.V. (mL/kg) 935.9 (13.7)   Blood 1678   IV Piggyback 250   TPN 722   Total Intake(mL/kg) 3825.9 (55.8)   Urine (mL/kg/hr) 1370 (0.8)   Chest Tube 1050 (0.6)   Total Output 2420   Net +1405.9         PHYSICAL EXAMINATION: General:  Acute respiratory distress, pale, diaphoretic, afebrile Neuro:  Follows commands, no focal deficits noted HEENT:  Oral mucosa dry at time of initial contact, PERRL, EOMI, pale conjunctiva Cardiovascular:  Difficult to auscultate S1S2 due to LVAD, no heaves,  Lungs:  Decreased breath sounds on the right, rales noted at bases, no wheezes or rhonchi,  Good air movement despite this. Abdomen:  Non tender, no distended, drive lines in place without drainage, decreased bowel sounds Musculoskeletal:  No lower extremity edema, unable to palpate pulses Skin:  Pale, no ecchymosis, no evidence of retropertoneal bleed  LABS:  Recent Labs Lab 11/01/13 1033 11/01/13 1812 11/01/13 1838 11/02/13 0355 11/02/13 0500  11/03/13 0742 11/04/13 0255 11/05/13 0330 11/05/13 2059 11/05/13 2122  11/05/13 2125 11/05/13 2305 11/05/13 2353 11/06/13 0132 11/06/13 0156 11/06/13 0201  HGB 12.3* 12.5*  --  12.5* 10.9*  --  10.8* 10.1* 9.5* 7.5*  --  6.7*  --   --  12.3*  --   --   WBC 7.3  10.7*  --  8.0 8.5  --  8.7 7.6 9.1  --   --  12.5*  --   --  12.3*  --   --   PLT 251 234  --  243 240  --  248 207 220  --   --  234  --   --  138*  --   --   NA 139 139  --  137 137  --  135* 136* 136* 134*  --  132*  --   --  137  --   --   K 4.4 4.3  --  4.3 4.1  --  4.1 4.3 4.5 4.6  --  4.6  --   --  3.8  --   --   CL 102 102  --  102 101  --  100 101 102 100  --  100  --   --  98  --   --   CO2 24 23  --  22 23  --  25 24 21   --   --  19  --   --  18*  --   --   GLUCOSE 99 116*  --  117* 96  --  119* 121* 130* 149*  --  168*  --   --  261*  --   --   BUN 16 17  --  14 15  --  20 21 21  29*  --  29*  --   --  29*  --   --   CREATININE 1.37* 1.37*  --  1.12 1.21  --  1.06 1.05 0.89 1.50*  --  1.38*  --   --  1.47*  --   --   CALCIUM 8.6 8.8  --  8.8 8.6  --  8.7 8.4 8.5  --   --  7.9*  --   --  8.7  --   --   MG  --   --   --  2.1 2.0  --  2.1  --   --   --   --   --   --   --   --   --   --   PHOS  --   --   --  3.5 4.0  --  3.8  --   --   --   --   --   --   --   --   --   --   AST 36 36  --  31 32  --  35 28 24  --   --   --   --   --  538*  --   --   ALT 27 28  --  23 22  --  24 20 16   --   --   --   --   --  293*  --   --   ALKPHOS 134* 134*  --  118* 101  --  102 91 85  --   --   --   --   --  102  --   --   BILITOT 0.5 0.6  --  0.6 0.7  --  0.4 0.4 0.3  --   --   --   --   --  1.3*  --   --   PROT 7.7 7.6  --  7.3 6.8  --  7.0 6.5 6.5  --   --   --   --   --  6.3  --   --   ALBUMIN 3.1* 3.3*  --  3.0* 2.9*  --  2.8* 2.7* 2.6*  --   --   --   --   --  2.6*  --   --   INR 3.74* 3.56*  --  3.15*  --   < > 2.09* 1.67* 1.33  --   --   --   --   --  1.57*  --   --   LATICACIDVEN  --   --  1.31  --   --   --   --   --   --   --  3.2*  --   --   --   --  5.7*  --   TROPONINI  --  <0.30  --   --   --    --   --   --   --   --   --   --   --   --   --   --   --   PROBNP 877.6*  --   --   --   --   --   --   --   --   --   --   --   --   --   --   --   --   PHART  --   --   --   --   --   --   --   --   --   --   --   --  7.150* 7.348*  --   --  7.386  PCO2ART  --   --   --   --   --   --   --   --   --   --   --   --  39.0 30.7*  --   --  36.3  PO2ART  --   --   --   --   --   --   --   --   --   --   --   --  120.0* 65.0*  --   --  199.0*  < > = values in this interval not displayed.  Recent Labs Lab 11/04/13 1725 11/04/13 2309 11/05/13 0608 11/05/13 1201 11/05/13 1642  GLUCAP 112* 118* 127* 118* 131*    CXR: Patient with right sided chest tube in place, ET tube in place, cardiomegaly, right subclavian line in place, noted right hemi opacity (improvment)  CT chest Complicated effusion, multiple denisties with layering consistent with blood in right thorax.  No gas pockets in fluid collection  ASSESSMENT / PLAN:  PULMONARY A:  Respiratory Failure Due to Cardiogenic/Hemorrhagic shock  P:   Intubated for metabolic requirements (CO2 generated with metabolic stress, not air trapping) lung protective ventilator strategy 6-8 cc/kg IDW to avoid secondary insult, acutely goal MV 11-12 with plans on decreasing TV Avoid PEEP elevations due to history of RV failure unless hypoxia is not correctable with FiO2 <60% Peak airway pressures <30 (platuea pressures acceptable however) PRVC Avoid dyssynchrony ET tube size: 7.5  CARDIOVASCULAR A: Cardiogenic/Hemorrhagic Shock LVAD in place functioning appropriately with good flow Norepinephrine requirements trending downward with vasopressin supplementation (does not constrict pulmonary vasculature in a patient with known RVHF) P:  Management as per primary team  RENAL A:  AKI Acute renal failure Pre-Renal from hemorrhagic shock/Cardiogenic Shock  P: Optimize hemodynamics with judicious use of crystalloid and vasopressors MAP Goal  >  65-75 for adequate perfusion Foley for strict I/O's Avoid nephrotoxic substances Renally dose medications Pending UA   GASTROINTESTINAL A:  Stress Ulcer Prophylaxis P:   Protonix in place  HEMATOLOGIC A:  Hemorrhagic shock Transfusion with FFP, partial reversal of anticoagulation and holding coumadin (risk of bleeding vs LVAD circuit clotting) P:  As per primary team, undergoing workup   INFECTIOUS A:  Possible sepsis Multiple sites of possible infection with CVL, LVAD, Urine, Lung with persistent opacity (although likely just evolving bleed) P:   Zosyn/Vanc Procalcitonin ordered De-escalate antibiotics ASAP  ENDOCRINE A:  Glucose control   P:   SSI in place  NEUROLOGIC A:  Hx of Delerium P:   Avoid Versed gtt Recommend precedex if hemodynamics can tolerate it  I have personally obtained a history, examined the patient, evaluated laboratory and imaging results, formulated the assessment and plan and placed orders. CRITICAL CARE: The patient is critically ill with multiple organ systems failure and requires high complexity decision making for assessment and support, frequent evaluation and titration of therapies, application of advanced monitoring technologies and extensive interpretation of multiple databases. Critical Care Time devoted to patient care services described in this note is 45 minutes.    Pulmonary and [place] Pager: [phone]  11/06/2013, 4:26 AM

## 2013-11-06 NOTE — Anesthesia Postprocedure Evaluation (Signed)
  Anesthesia Post-op Note  Patient: Johnny Oneill  Procedure(s) Performed: Procedure(s): VIDEO ASSISTED THORACOSCOPY (Right) EVACUATION HEMATOMA (Right)  Patient Location: ICU  Anesthesia Type:General  Level of Consciousness: sedated and unresponsive  Airway and Oxygen Therapy: Patient remains intubated and on ventilator  Post-op Pain: none  Post-op Assessment: Post-op Vital signs reviewed, Patient's Cardiovascular Status Stable and Respiratory Function Stable  Post-op Vital Signs: Reviewed  Filed Vitals:   11/06/13 1439  Pulse: 70  Temp:   Resp: 12    Complications: No apparent anesthesia complications

## 2013-11-06 NOTE — Progress Notes (Signed)
Pt has been documented at a set RR of 20 but I found ventilator set at 16. RT will continue to monitor.

## 2013-11-06 NOTE — Progress Notes (Signed)
Vt decreased to 55ml (7cc/kg IBW) to adjust for alkalotic pH on ABG and to bring peak pressures <30

## 2013-11-06 NOTE — Progress Notes (Signed)
Chaplain paged to [employer]. Chaplain consulted with unit secretary. Chaplain offered support to patient's wife while she was sitting in the waiting areas. Patient's wife said "they were able to revive her husband and that she was contacting their pastor. " Chaplain offered to assist with making phone calls.   11/05/13 2322  Clinical Encounter Type  Visited With Family;Health care provider  Visit Type Initial;Code  Referral From Nurse  Spiritual Encounters  Spiritual Needs Emotional

## 2013-11-06 NOTE — Progress Notes (Signed)
Advanced Heart Failure Team Rounding Note     Subjective:    Johnny Oneill is a 73 year old patient with a history of CAD s/p stent to LCX 2004 with re-occlusion of LCX with lateral MI 2009 and repeat stenting, total occlusion of RCA, ICM, chronic systolic heart failure, hypothyroidism, HLD and arthritis. 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   Admitted to [facility] 09/13/13 through 10/03/13. Initially admitted with decompenstaed heart failure and low output. Required IABP for stabilization. He ultimately had LVAD HM II placed on 09/19/13. He suffered from RV failure after the procedure and required milrinone for >7 days which was gradually weaned to off. During his stay he did develop acute ICU psychosis with delirium which was treated with Zyprexa. Once he was transferred out of the ICU this improved and he returned back to baseline. He was not discharged on beta blocker. D/C weight 151 pounds.   Admitted on 3/4 with SBO/ileus. Was improving then had cardiac arrest last night (3/8) with PEA in setting of R hemothorax. Now s/p R chest tube and multiple units transfusion.   More stable this am MAPs 80. Hgb drifting down slowly again. Still with drainage into CT ~30-40cc/hour. Chest CT shows persistent loculated fluid collection. Discussed with Dr. Prescott Gum who will plan VATS drainage today. Renal function not to bad. LFTs up in 500 range due to shock liver.   LVAD INTERROGATION:  HeartMate II LVAD: Flow 4.3 liters/min, speed 9400, power 5.0, PI 4.6  Low flow alarms from overnight  I reviewed the LVAD parameters from today, and compared the results to the patient's prior recorded data. No programming changes were made. The LVAD is functioning within specified parameters. The patient performs LVAD self-test daily. LVAD interrogation was negative for any significant power changes, alarms or PI events/speed drops. LVAD equipment check completed and is in good working order. Back-up equipment present.  LVAD education done on emergency procedures and precautions and reviewed exit site care.   Physical Exam: GENERAL: intubated. sedated HEENT: normal  +ET NECK: Supple CARDIAC: Mechanical heart sounds with LVAD hum present.  LUNGS: R chest tube. + rhonchi. Decreased BS on R.  ABDOMEN: Soft, nondistended, nontender, hypoactive bowel sounds LVAD exit site: well-healed and incorporated. Dressing dry and intact. No erythema or drainage. Stabilization device present and accurately applied. Driveline dressing is being changed daily per sterile technique.  EXTREMITIES: Warm and dry, no cyanosis, clubbing, rash or edema  NEUROLOGIC: Alert and oriented x 4. Gait steady. No aphasia. No dysarthria. Affect pleasant.    Telemetry: SR   Labs: Basic Metabolic Panel:  Recent Labs Lab 11/02/13 0355 11/02/13 0500 11/03/13 0742 11/04/13 0255 11/05/13 0330 11/05/13 2059 11/05/13 2125 11/06/13 0132 11/06/13 0500  NA 137 137 135* 136* 136* 134* 132* 137 135*  K 4.3 4.1 4.1 4.3 4.5 4.6 4.6 3.8 3.9  CL 102 101 100 101 102 100 100 98 97  CO2 22 23 25 24 21   --  19 18* 21  GLUCOSE 117* 96 119* 121* 130* 149* 168* 261* 169*  BUN 14 15 20 21 21  29* 29* 29* 31*  CREATININE 1.12 1.21 1.06 1.05 0.89 1.50* 1.38* 1.47* 1.51*  CALCIUM 8.8 8.6 8.7 8.4 8.5  --  7.9* 8.7 8.4  MG 2.1 2.0 2.1  --   --   --   --   --   --   PHOS 3.5 4.0 3.8  --   --   --   --   --   --  Liver Function Tests:  Recent Labs Lab 11/03/13 0742 11/04/13 0255 11/05/13 0330 11/06/13 0132 11/06/13 0500  AST 35 28 24 538* 570*  ALT 24 20 16  293* 332*  ALKPHOS 102 91 85 102 95  BILITOT 0.4 0.4 0.3 1.3* 1.4*  PROT 7.0 6.5 6.5 6.3 5.8*  ALBUMIN 2.8* 2.7* 2.6* 2.6* 2.6*    Recent Labs Lab 11/01/13 1812  LIPASE 39   No results found for this basename: AMMONIA,  in the last 168 hours  CBC:  Recent Labs Lab 11/01/13 1812  11/03/13 0742  11/05/13 0330 11/05/13 2059 11/05/13 2125 11/06/13 0132 11/06/13 0500  11/06/13 0800  WBC 10.7*  < > 8.7  < > 9.1  --  12.5* 12.3* 17.5* 15.1*  NEUTROABS 8.8*  --  6.5  --   --   --   --   --   --   --   HGB 12.5*  < > 10.8*  < > 9.5* 7.5* 6.7* 12.3* 11.4* 11.0*  HCT 38.7*  < > 34.4*  < > 30.2* 22.0* 20.7* 35.5* 32.4* 31.1*  MCV 85.6  < > 85.8  < > 84.6  --  85.5 86.6 84.4 84.1  PLT 234  < > 248  < > 220  --  234 138* 131* 121*  < > = values in this interval not displayed.  Cardiac Enzymes:  Recent Labs Lab 11/01/13 1812  TROPONINI <0.30    BNP: BNP (last 3 results)  Recent Labs  09/13/13 1349 09/25/13 0400 11/01/13 1033  PROBNP 3883.0* 7019.0* 877.6*    CBG:  Recent Labs Lab 11/04/13 2309 11/05/13 0608 11/05/13 1201 11/05/13 1642 11/06/13 0826  GLUCAP 118* 127* 118* 131* 151*    Coagulation Studies:  Recent Labs  11/04/13 0255 11/05/13 0330 11/06/13 0132 11/06/13 0500  LABPROT 19.2* 16.2* 18.3* 18.5*  INR 1.67* 1.33 1.57* 1.59*    Other results:   Imaging: Ct Abdomen Pelvis Wo Contrast  11/06/2013   CLINICAL DATA:  Status post code, possible pulmonary hemorrhage.  EXAM: CT CHEST, ABDOMEN AND PELVIS WITHOUT CONTRAST  TECHNIQUE: Multidetector CT imaging of the chest, abdomen and pelvis was performed following the standard protocol without IV contrast.  COMPARISON:  None.  FINDINGS: CT CHEST FINDINGS  Endotracheal tube is in grossly good position. Ventricular assist device is again noted. Nasogastric tube is seen passing through the esophagus into proximal stomach. Minimal left pleural effusion is noted with adjacent subsegmental atelectasis. No pneumothorax is seen. Right-sided chest tube is noted with tip directed poor posterior chest. Large right pleural effusion is noted with high density material layering within it concerning for hemorrhage. Left-sided pacemaker is noted with leads in grossly good position. Sternotomy wires are noted.  CT ABDOMEN AND PELVIS FINDINGS  No focal abnormality seen in the liver, spleen or pancreas  on these unenhanced images. Small gallstones are noted in neck of gallbladder. Adrenal glands appear normal. No hydronephrosis or renal obstruction is noted. No renal or ureteral calculi are noted. Atherosclerotic calcifications of abdominal aorta are noted without aneurysm formation. No bowel obstruction is noted. Sigmoid diverticulosis is noted without inflammation. Urinary bladder is decompressed secondary to Foley catheter. No significant adenopathy is noted.  IMPRESSION: Endotracheal tube in grossly good position. Ventricular assistance device and left-sided pacemaker are noted. Right-sided chest tube has been placed with distal tip directed toward posterior portion of chest. Large right pleural effusion is noted with high density material layering within it concerning for hemorrhage.  Minimal cholelithiasis is  noted. Sigmoid diverticulosis is noted without inflammation. No other significant abnormality seen in the abdomen or pelvis.   Electronically Signed   By: Sabino Dick M.D.   On: 11/06/2013 01:55   Ct Chest Wo Contrast  11/06/2013   CLINICAL DATA:  Status post code, possible pulmonary hemorrhage.  EXAM: CT CHEST, ABDOMEN AND PELVIS WITHOUT CONTRAST  TECHNIQUE: Multidetector CT imaging of the chest, abdomen and pelvis was performed following the standard protocol without IV contrast.  COMPARISON:  None.  FINDINGS: CT CHEST FINDINGS  Endotracheal tube is in grossly good position. Ventricular assist device is again noted. Nasogastric tube is seen passing through the esophagus into proximal stomach. Minimal left pleural effusion is noted with adjacent subsegmental atelectasis. No pneumothorax is seen. Right-sided chest tube is noted with tip directed poor posterior chest. Large right pleural effusion is noted with high density material layering within it concerning for hemorrhage. Left-sided pacemaker is noted with leads in grossly good position. Sternotomy wires are noted.  CT ABDOMEN AND PELVIS FINDINGS   No focal abnormality seen in the liver, spleen or pancreas on these unenhanced images. Small gallstones are noted in neck of gallbladder. Adrenal glands appear normal. No hydronephrosis or renal obstruction is noted. No renal or ureteral calculi are noted. Atherosclerotic calcifications of abdominal aorta are noted without aneurysm formation. No bowel obstruction is noted. Sigmoid diverticulosis is noted without inflammation. Urinary bladder is decompressed secondary to Foley catheter. No significant adenopathy is noted.  IMPRESSION: Endotracheal tube in grossly good position. Ventricular assistance device and left-sided pacemaker are noted. Right-sided chest tube has been placed with distal tip directed toward posterior portion of chest. Large right pleural effusion is noted with high density material layering within it concerning for hemorrhage.  Minimal cholelithiasis is noted. Sigmoid diverticulosis is noted without inflammation. No other significant abnormality seen in the abdomen or pelvis.   Electronically Signed   By: Sabino Dick M.D.   On: 11/06/2013 01:55   Dg Chest Port 1 View  11/06/2013   CLINICAL DATA:  Hemothorax, followup, past history coronary artery disease, CHF, ischemic cardiomyopathy, MI, arrhythmia, AICD, left ventricular assist device  EXAM: PORTABLE CHEST - 1 VIEW  COMPARISON:  Portable exam 0610 hr compared to 11/06/2013  FINDINGS: Left ventricular assist device again noted.  Tip of endotracheal tube projects 4.0 cm above carina.  Nasogastric tube extends into stomach.  Right thoracostomy tube unchanged.  Right subclavian central venous catheter tip projects over distal SVC.  Left subclavian AICD leads project over right atrium and right ventricle.  External pacing leads present.  Enlargement of cardiac silhouette post median sternotomy.  Persistent loculated pleural effusion by history hemothorax upper right chest.  Increased atelectasis versus infiltrate in right lower lobe.  Minimal  left basilar atelectasis.  No gross pneumothorax.  IMPRESSION: Persistent loculated right pleural effusion/hemothorax.  Slightly increased right basilar atelectasis versus infiltrate.  Minimal left basilar atelectasis.   Electronically Signed   By: Lavonia Dana M.D.   On: 11/06/2013 08:10   Dg Chest Port 1 View  11/06/2013   CLINICAL DATA:  Right-sided chest tube placement.  EXAM: PORTABLE CHEST - 1 VIEW  COMPARISON:  November 05, 2013.  FINDINGS: Stable cardiomegaly. Right-sided PICC line is unchanged in position. Left-sided pacemaker is again noted and unchanged. Ventricular assistance device is again noted. Endotracheal tube is seen with distal tip approximately 3 cm above the carina. Nasogastric tube is seen passing through the esophagus and into the stomach. Interval placement of right-sided chest  tube. No pneumothorax is seen there remains opacification the right hemi thorax, but pleural effusion appears to be improved.  IMPRESSION: Endotracheal tube is in grossly good position. Interval placement of right-sided chest tube without evidence of pneumothorax; right pleural effusion noted on prior exam appears to be slightly improved with increased aeration of right lower lobe.   Electronically Signed   By: Sabino Dick M.D.   On: 11/06/2013 01:25   Dg Chest Port 1 View  11/05/2013   CLINICAL DATA:  Shortness of breath.  EXAM: PORTABLE CHEST - 1 VIEW  COMPARISON:  November 04, 2013.  FINDINGS: Sternotomy wires are noted. Ventricular assist device is again noted. Left-sided pacemaker is unchanged in position. Right-sided PICC line is unchanged in position. There is now complete opacification of the right hemothorax due to pneumonia or effusion. No mediastinal shift is seen at this time.  IMPRESSION: Complete opacification of the right hemothorax is now noted concerning for pneumonia, atelectasis or effusion.   Electronically Signed   By: Sabino Dick M.D.   On: 11/05/2013 23:03        Assessment:   1. SBO 2.  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 CAD 4. Hypothyroidism 5. Hemorrhagic shock due to R hemothorax    --s/p R chest tube 3/8 6. Acute blood loss anemia 7. PEA arrest 8. Shock liver 9. A/C renal failure, stage III 10. Ventilatory-dependent respiratory failure  Plan/Discussion:    He is critically ill but stabilizing. I worry he may still be bleeding into his R chest. Will follow serial HCTs. Plan for VATs drainage today. Will continue to support hemodynamics. PCCM managing vent. Continue broad spectrum abx for now but can likely stop soon. Place SCDs.Hold all anti-coagulants and HF meds.   Wife updated.   The patient is critically ill with multiple organ systems failure and requires high complexity decision making for assessment and support, frequent evaluation and titration of therapies, application of advanced monitoring technologies and extensive interpretation of multiple databases.   Critical Care Time devoted to patient care services described in this note is 35 Minutes.    Length of Stay: 5 Glori Bickers MD 11/06/2013, 9:06 AM  Advanced Heart Failure Team Pager [phone] (M-F; [place])  Please contact [facility] for night-coverage after hours (4p -7a ) and weekends on [email or web address]

## 2013-11-06 NOTE — Anesthesia Preprocedure Evaluation (Signed)
Anesthesia Evaluation   Patient unresponsive    Reviewed: Allergy & Precautions  Airway       Dental   Pulmonary shortness of breath,          Cardiovascular hypertension, + CAD, + Past MI and +CHF + dysrhythmias + pacemaker + Cardiac Defibrillator     Neuro/Psych PSYCHIATRIC DISORDERS    GI/Hepatic   Endo/Other  Hypothyroidism   Renal/GU ARFRenal disease     Musculoskeletal   Abdominal   Peds  Hematology   Anesthesia Other Findings   Reproductive/Obstetrics                           Anesthesia Physical Anesthesia Plan  ASA: IV  Anesthesia Plan:    Post-op Pain Management:    Induction:   Airway Management Planned:   Additional Equipment:   Intra-op Plan:   Post-operative Plan:   Informed Consent:   Only emergency history available and History available from chart only  Plan Discussed with: Anesthesiologist, Surgeon and CRNA  Anesthesia Plan Comments:         Anesthesia Quick Evaluation

## 2013-11-06 NOTE — Procedures (Signed)
Name: [name] MRN: [id number] DOB: 21-Oct-1940  DOS:  PROCEDURE NOTE  Procedure:  Arterial catheter placement.  Indications:  Need for invasive hemodynamic monitoring / frequent arterial blood gases measurement.  Consent:  Consent was implied due to the emergency nature of the procedure.  Procedure summary:  The patient was identified as Johnny Oneill and safety timeout was performed. Collateral arterial flow was confirmed by performing Allen's test. Sterile technique was used. The patient's right groin was prepped using chlorhexidine  and the field was draped in usual sterile fashion with protective barrier. The right femoral artery was cannulated without difficulty. Blood was aspirated and the catheter was flushed with normal saline without difficulty. Good arterial waveform was obtained. The catheter was secured into place with sterile dressing.  Complications:  No immediate complications were noted.  Estimated blood loss:  Less then 5 mL  J. Gaspar Cola, [place]  11/06/2013, 12:45 AM

## 2013-11-06 NOTE — Progress Notes (Signed)
All blood products administered were given emergently. The following products were checked off by 2 RN per protocol:  FFP- [id number]:[id number] FFP- HL:[id number]  PRBC- LW:[id number] PRBC- TX:[id number] PRBC- OP:[id number] PRBC- LK:[id number] (same number as above on slip)

## 2013-11-06 NOTE — Progress Notes (Signed)
CT surgery p.m. Rounds  Patient went to OR today for right VATS and evacuation of hematoma-hemothorax  LVAD function is improved after removal of hematoma Patient remains sedated on ventilator tonight We'll start low-dose heparin early in a.m. and follow serial PTT Will need nutrition and with evidence of ileus-persistent partial bowel obstruction the patient may need T. NA

## 2013-11-06 NOTE — Progress Notes (Addendum)
  Called to see patient due to hypotension.  This evening patient developed weakness and nausea. MAPs dropped to 60 range. Co-ox obtained 51%. HGB 6.7. CVP 22. (?) . Started on milrinone for presumed RV failure and CBC/lactate obtained. CBC confirmed dropping Hgb. Lactate 3.2.  MAPs dropped to 50. Milrinone stopped and switched to levophed. 4u RBCs ordered and 2 FFP. Abx broadened due to concern for bowel ischemia/dead gut.  Emergent echo performed at bedside showed LV unloading well. Normal RV function. No effusion.  ABG obtained with pH 7.1. Given 3 amps bicarb and CCM consulted for intubation. After intubation patient developed cardiac arrest with no flow on LVAD pump. Epi given and cautious CPR begun. Return of circulation then obtained after about 30 seconds. CPR stopped. Patient developed VT and defibrillated x 2. Amiodarone started. With persistent hypotension vasopressin added and bicarb drip started.   CCM placed femoral A-line and triple lumen catheter.   Dr Prescott Gum paged and arrived at bedside. Pre-intubation CXR notable for R hemothorax. Dr. Prescott Gum placed chest tube at bedside with nearly one liter blood obtained.   Patient stabilized and transported with 2S team to CT for CT C/A/P. Results pending.   Total CCT 2.5 hours for hemorrhagic shock due to R hemothorax.  Lujean Ebright,MD 1:14 AM

## 2013-11-06 NOTE — Procedures (Addendum)
Intubation Procedure Note Johnny Oneill [id number] [birth year]  Procedure: Intubation Indications: Respiratory insufficiency  Procedure Details Consent: Risks of procedure as well as the alternatives and risks of each were explained to the (patient/caregiver).  Consent for procedure obtained. Time Out: Verified patient identification, verified procedure, site/side was marked, verified correct patient position, special equipment/implants available, medications/allergies/relevent history reviewed, required imaging and test results available.  Performed  Glidescope used Size 4 Size 7.5 at 23 cm 25 mcg fentanyl and 10 mg etomidate given, patient with acceptable sedation, 100mg  of rocuronium subsequently given   Evaluation Hemodynamic Status: BP unstable throughout (on levophed with LVAD with worsening life threatening respiratory acidosis); O2 sats: stable throughout Patient's Current Condition: unstable Complications: No apparent complications Patient did tolerate procedure well. Chest X-ray ordered to verify placement.  CXR: tube position acceptable.   Johnny Oneill 11/06/2013

## 2013-11-06 NOTE — Progress Notes (Signed)
Dr. Haroldine Laws notified of PI 3.9-4.3. CVP 6. U/o 30/hr. Will give 500cc NS bolus at this time. Will continue to monitor. Richarda Blade RN

## 2013-11-06 NOTE — Op Note (Signed)
CARDIOTHORACIC SURGERY OPERATIVE NOTE:  HEINZ FAGO [id number] 11/06/2013   Preoperative Dx:  Clotted right hemothorax  Postoperative Dx: same   Procedure: Right video-assisted thoracoscopy and evacuation of clotted hemothorax  Surgeon: Dr. Gaye Pollack   Assistant: Jadene Pierini, PA-C  Anesthesia: GET   Clinical History:   The patient underwent placement of a left ventricular assist device 2 months ago. He was recently admitted with partial small bowel obstruction. He is originally started on IV heparin because of low INR-Coumadin by mouth was being held. He developed acute decompensation with drop in hemoglobin and required intubation and acute resuscitation. Chest x-ray showed white out of the right hemithorax which was new from his previous x-ray. He had a 91F chest tube placed by Dr. Darcey [name] with drainage of over 1L of bloody fluid. A followup CT scan of the chest showed significant residual clotted hemothorax. It was felt that the best option was to perform VATS in the OR for complete drainage of the clotted hemothorax. I discussed the procedure with the patient's wife including alternatives, benefits, and risks including but not limited to bleeding, blood transfusion, infection, injury to the lung, prolonged air leak and recurrent effusion. She understood and agreed to proceed.   Operative procedure:   The proper patient, proper operative side, proper operation were confirmed after reviewing his history and chest x-ray. The left side of the chest was signed by me. Preoperative intravenous antibiotics were given. He was taken back to the operating room and placed on table in the supine position. After induction of general endotracheal anesthesia the single lumen tube was converted to  a double-lumen tube. Lower extremity sequential compression devices were used. A right IJ triple lumen catheter was placed by Dr. Ermalene Postin of anesthesia without difficulty. The patient was positioned in the  left lateral decubitus position with the left side up. He had stable hemodynamics in this position. The patient's ICD had been turned off in the OR. The previously placed chest tube was removed. The right side of the chest was prepped with Betadine soap and solution and draped in the usual sterile manner. A timeout was taken and the proper patient, proper operation, and proper operative side were confirmed with nursing and anesthesia staff. A 1 cm incision was made in the midaxillary line at about the eighth intercostal space. The right pleural space was entered bluntly with a hemostat and an 10 mm trocar was inserted. The 30 thoracoscope was inserted and the pleural space inspected. The pleural space was full of clot. A trocar was placed through the prior chest tube site and the scope inserted there. The clot was removed using a combination of ring tipped clamps and a suction/irrigator. All of the clot was removed. The right upper lobe appeared firmly adherent to the chest was superiorly and the apex of the chest could not be visualized. There was not active bleeding site seen and no oozing from the chest wall. Then a 50 F Bard drain was placed posteriorly and a 69F chest tube anteriorly. The chest tubes were connected to Pleur-evac suction. The old chest tube site was closed with 3-0 vicryl subcutaneous sutures and skin staples. The sponge needle and instrument counts were correct according to the scrub nurse. The patient was then turned into the supine position, the double lumen tube converted to a single lumen tube, and he was transported to the surgical intensive care unit in critical but stable condition.

## 2013-11-06 NOTE — Progress Notes (Signed)
During drive line dressing change silver was noted around insertion site. When removing silver dressing it was pretty well stuck to the skin. The skin under the silver was very red and irritated with slight pus colored drainage against drive line. Sterile technique was used during entire dressing change and site was cleaned thoroughly. MD notified at PM rounds. Will pass on in report and continue to monitor. Pt comfortable resting on vent with vs WNL. MD notified of decreasing Flow and levophed being turned back on r/t decreasing MAPs. 1 albumin ordered and administered. Family updated on pt status.

## 2013-11-06 NOTE — Procedures (Signed)
Central Venous Catheter Insertion Procedure Note DERYN ABRAMOWICZ [id number] [birth year]  Procedure: Insertion of Central Venous Catheter Indications: Drug and/or fluid administration and Frequent blood sampling  Procedure Details Consent: Unable to obtain consent because of emergent medical necessity. Time Out: Verified patient identification, verified procedure, site/side was marked, verified correct patient position, special equipment/implants available, medications/allergies/relevent history reviewed, required imaging and test results available.  Performed  Maximum sterile technique was used including antiseptics, cap, gloves, gown, hand hygiene, mask and sheet. Skin prep: Chlorhexidine Procedure summary:  Appropriate equipment was assembled.  The patient was identified as Johnny Oneill and safety timeout was performed. The patient was placed in Trendelenburg position.  Sterile technique was used. The patient's right groin was prepped using chlorhexidine and the field was draped in usual sterile fashion with a local drape. The right femoral jugular vein and the right femoral artery were identified by ultrasound, the patency was evaluated and images were documented. After the adequate anesthesia was achieved, the vein was cannulated with the introducer needle under sonographic guidance without difficulty. A guide wire was advanced through the introducer needle, which was then withdrawn. A small skin incision was made at the point of wire entry, the dilator was inserted over the guide wire and appropriate dilation was obtained. The dilator was removed and a [place] triple-lumen catheter was advanced over the guide wire, which was then removed.  All ports were aspirated and flushed with normal saline without difficulty. The catheter was secured into place with sutures at 15 cm. Antibiotic patch was placed and sterile dressing was applied.   Evaluation Blood flow good Complications: No apparent  complications Patient did tolerate procedure well.  ANGELITA, GASPARINI 11/06/2013, 12:46 AM

## 2013-11-06 NOTE — Progress Notes (Signed)
Gave patient's laptop, binder with papers, spiral notebook, bottle of hand sanitizer, bottle of chloraseptic spray, and loose papers to patient's wife. Two pairs of glasses remain in room. Placed in kidney basin in back of room on top of cart. Richarda Blade RN

## 2013-11-07 ENCOUNTER — Ambulatory Visit (HOSPITAL_COMMUNITY): Payer: Medicare Other

## 2013-11-07 ENCOUNTER — Inpatient Hospital Stay (HOSPITAL_COMMUNITY): Payer: Medicare Other

## 2013-11-07 ENCOUNTER — Encounter (HOSPITAL_COMMUNITY): Payer: Self-pay | Admitting: Surgery

## 2013-11-07 DIAGNOSIS — R570 Cardiogenic shock: Secondary | ICD-10-CM

## 2013-11-07 DIAGNOSIS — J13 Pneumonia due to Streptococcus pneumoniae: Secondary | ICD-10-CM

## 2013-11-07 DIAGNOSIS — I5023 Acute on chronic systolic (congestive) heart failure: Secondary | ICD-10-CM

## 2013-11-07 DIAGNOSIS — Z95818 Presence of other cardiac implants and grafts: Secondary | ICD-10-CM

## 2013-11-07 DIAGNOSIS — N179 Acute kidney failure, unspecified: Secondary | ICD-10-CM

## 2013-11-07 DIAGNOSIS — N189 Chronic kidney disease, unspecified: Secondary | ICD-10-CM

## 2013-11-07 LAB — PREPARE FRESH FROZEN PLASMA
Unit division: 0
Unit division: 0
Unit division: 0
Unit division: 0

## 2013-11-07 LAB — CBC
HCT: 25.6 % — ABNORMAL LOW (ref 39.0–52.0)
HCT: 27 % — ABNORMAL LOW (ref 39.0–52.0)
Hemoglobin: 9.1 g/dL — ABNORMAL LOW (ref 13.0–17.0)
Hemoglobin: 9.4 g/dL — ABNORMAL LOW (ref 13.0–17.0)
MCH: 30.1 pg (ref 26.0–34.0)
MCH: 30 pg (ref 26.0–34.0)
MCHC: 35.5 g/dL (ref 30.0–36.0)
MCHC: 34.8 g/dL (ref 30.0–36.0)
MCV: 84.8 fL (ref 78.0–100.0)
MCV: 86.3 fL (ref 78.0–100.0)
Platelets: 80 10*3/uL — ABNORMAL LOW (ref 150–400)
Platelets: 100 10*3/uL — ABNORMAL LOW (ref 150–400)
RBC: 3.02 MIL/uL — ABNORMAL LOW (ref 4.22–5.81)
RBC: 3.13 MIL/uL — ABNORMAL LOW (ref 4.22–5.81)
RDW: 16.7 % — ABNORMAL HIGH (ref 11.5–15.5)
RDW: 16.9 % — ABNORMAL HIGH (ref 11.5–15.5)
WBC: 7.2 10*3/uL (ref 4.0–10.5)
WBC: 11.1 10*3/uL — ABNORMAL HIGH (ref 4.0–10.5)

## 2013-11-07 LAB — GLUCOSE, CAPILLARY
Glucose-Capillary: 100 mg/dL — ABNORMAL HIGH (ref 70–99)
Glucose-Capillary: 100 mg/dL — ABNORMAL HIGH (ref 70–99)
Glucose-Capillary: 106 mg/dL — ABNORMAL HIGH (ref 70–99)
Glucose-Capillary: 109 mg/dL — ABNORMAL HIGH (ref 70–99)
Glucose-Capillary: 117 mg/dL — ABNORMAL HIGH (ref 70–99)
Glucose-Capillary: 82 mg/dL (ref 70–99)
Glucose-Capillary: 91 mg/dL (ref 70–99)

## 2013-11-07 LAB — COMPREHENSIVE METABOLIC PANEL
ALT: 233 U/L — ABNORMAL HIGH (ref 0–53)
AST: 259 U/L — ABNORMAL HIGH (ref 0–37)
Albumin: 2.8 g/dL — ABNORMAL LOW (ref 3.5–5.2)
Alkaline Phosphatase: 81 U/L (ref 39–117)
BUN: 33 mg/dL — ABNORMAL HIGH (ref 6–23)
CO2: 21 mEq/L (ref 19–32)
Calcium: 8.4 mg/dL (ref 8.4–10.5)
Chloride: 102 mEq/L (ref 96–112)
Creatinine, Ser: 1.22 mg/dL (ref 0.50–1.35)
GFR calc Af Amer: 68 mL/min — ABNORMAL LOW (ref >90)
GFR calc non Af Amer: 58 mL/min — ABNORMAL LOW (ref >90)
Glucose, Bld: 97 mg/dL (ref 70–99)
Potassium: 4.1 mEq/L (ref 3.7–5.3)
Sodium: 138 mEq/L (ref 137–147)
Total Bilirubin: 1.4 mg/dL — ABNORMAL HIGH (ref 0.3–1.2)
Total Protein: 5.6 g/dL — ABNORMAL LOW (ref 6.0–8.3)

## 2013-11-07 LAB — CARBOXYHEMOGLOBIN
Carboxyhemoglobin: 1.4 % (ref 0.5–1.5)
Methemoglobin: 0.8 % (ref 0.0–1.5)
O2 Saturation: 60.5 %
Total hemoglobin: 9.1 g/dL — ABNORMAL LOW (ref 13.5–18.0)

## 2013-11-07 LAB — POCT I-STAT 3, ART BLOOD GAS (G3+)
Acid-base deficit: 1 mmol/L (ref 0.0–2.0)
Bicarbonate: 23.9 mEq/L (ref 20.0–24.0)
O2 Saturation: 91 %
Patient temperature: 37.8
TCO2: 25 mmol/L (ref 0–100)
pCO2 arterial: 42.6 mmHg (ref 35.0–45.0)
pH, Arterial: 7.361 (ref 7.350–7.450)
pO2, Arterial: 65 mmHg — ABNORMAL LOW (ref 80.0–100.0)

## 2013-11-07 LAB — APTT
aPTT: 53 seconds — ABNORMAL HIGH (ref 24–37)
aPTT: 59 seconds — ABNORMAL HIGH (ref 24–37)

## 2013-11-07 LAB — BLOOD GAS, ARTERIAL
Acid-base deficit: 0.6 mmol/L (ref 0.0–2.0)
Bicarbonate: 22.7 mEq/L (ref 20.0–24.0)
Drawn by: 252031
FIO2: 0.5 %
MECHVT: 500 mL
O2 Saturation: 96.2 %
PEEP: 5 cmH2O
Patient temperature: 96
RATE: 16 resp/min
TCO2: 23.7 mmol/L (ref 0–100)
pCO2 arterial: 29.8 mmHg — ABNORMAL LOW (ref 35.0–45.0)
pH, Arterial: 7.487 — ABNORMAL HIGH (ref 7.350–7.450)
pO2, Arterial: 70.7 mmHg — ABNORMAL LOW (ref 80.0–100.0)

## 2013-11-07 LAB — PROTIME-INR
INR: 2.24 — ABNORMAL HIGH (ref 0.00–1.49)
Prothrombin Time: 24.1 seconds — ABNORMAL HIGH (ref 11.6–15.2)

## 2013-11-07 LAB — TRIGLYCERIDES: Triglycerides: 117 mg/dL (ref <150)

## 2013-11-07 LAB — PHOSPHORUS: Phosphorus: 3.4 mg/dL (ref 2.3–4.6)

## 2013-11-07 LAB — PREALBUMIN: Prealbumin: 10.4 mg/dL — ABNORMAL LOW (ref 17.0–34.0)

## 2013-11-07 LAB — MAGNESIUM: Magnesium: 1.7 mg/dL (ref 1.5–2.5)

## 2013-11-07 MED ORDER — METOCLOPRAMIDE HCL 5 MG/ML IJ SOLN
10.0000 mg | Freq: Four times a day (QID) | INTRAMUSCULAR | Status: DC
  Administered 2013-11-07 – 2013-11-17 (×37): 10 mg via INTRAVENOUS
  Filled 2013-11-07 (×51): qty 2

## 2013-11-07 MED ORDER — FUROSEMIDE 10 MG/ML IJ SOLN
40.0000 mg | Freq: Once | INTRAMUSCULAR | Status: AC
  Administered 2013-11-07: 40 mg via INTRAVENOUS
  Filled 2013-11-07: qty 4

## 2013-11-07 MED ORDER — VANCOMYCIN HCL IN DEXTROSE 750-5 MG/150ML-% IV SOLN
750.0000 mg | Freq: Two times a day (BID) | INTRAVENOUS | Status: DC
  Administered 2013-11-07 – 2013-11-12 (×10): 750 mg via INTRAVENOUS
  Filled 2013-11-07 (×11): qty 150

## 2013-11-07 MED ORDER — FAT EMULSION 20 % IV EMUL
250.0000 mL | INTRAVENOUS | Status: AC
  Administered 2013-11-07: 250 mL via INTRAVENOUS
  Filled 2013-11-07: qty 250

## 2013-11-07 MED ORDER — BISACODYL 10 MG RE SUPP
10.0000 mg | Freq: Once | RECTAL | Status: AC
  Administered 2013-11-07: 10 mg via RECTAL
  Filled 2013-11-07: qty 1

## 2013-11-07 MED ORDER — DEXTROSE 70 % IV SOLN: INTRAVENOUS | Status: DC

## 2013-11-07 MED ORDER — HEPARIN (PORCINE) IN NACL 100-0.45 UNIT/ML-% IJ SOLN
700.0000 [IU]/h | INTRAMUSCULAR | Status: DC
  Administered 2013-11-07: 550 [IU]/h via INTRAVENOUS
  Administered 2013-11-08: 700 [IU]/h via INTRAVENOUS
  Administered 2013-11-08: 600 [IU]/h via INTRAVENOUS
  Filled 2013-11-07 (×2): qty 250

## 2013-11-07 MED ORDER — M.V.I. ADULT IV INJ
INTRAVENOUS | Status: AC
  Administered 2013-11-07: 18:00:00 via INTRAVENOUS
  Filled 2013-11-07: qty 1000

## 2013-11-07 MED ORDER — BISACODYL 10 MG RE SUPP
10.0000 mg | Freq: Once | RECTAL | Status: AC
Start: 2013-11-07 — End: 2013-11-07
  Filled 2013-11-07: qty 1

## 2013-11-07 MED ORDER — ACETYLCYSTEINE 20 % IN SOLN
4.0000 mL | Freq: Once | RESPIRATORY_TRACT | Status: DC
  Filled 2013-11-07 (×2): qty 4

## 2013-11-07 MED FILL — Sodium Chloride IV Soln 0.9%: INTRAVENOUS | Qty: 2000 | Status: AC

## 2013-11-07 NOTE — Progress Notes (Signed)
Assisted Dr. Nils Pyle with bronchscopy.

## 2013-11-07 NOTE — Progress Notes (Signed)
ANTIBIOTIC CONSULT NOTE   Pharmacy Consult for Vancomycin and Zosyn Indication: empiric for rule-out sepsis   Patient Measurements: Height: 5\' 9"  (175.3 cm) Weight: 171 lb 11.8 oz (77.9 kg) IBW/kg (Calculated) : 70.7  Vital Signs: Temp: 99.9 F (37.7 C) (03/10 0400) Temp src: Core (Comment) (03/10 0400) BP: 89/76 mmHg (03/10 0321) Pulse Rate: 74 (03/10 0700) Intake/Output from previous day: 03/09 0701 - 03/10 0700 In: 2313.6 [I.V.:1292.6; Blood:1; NG/GT:20; IV Piggyback:1000] Out: 1760 [Urine:1015; Emesis/NG output:200; Blood:350; Chest Tube:195] Intake/Output from this shift:    Labs:  Recent Labs  11/06/13 0855  11/06/13 1500 11/06/13 2000 11/06/13 2300 11/07/13 0400  WBC  --   < > 11.3* 8.8 8.6 7.2  HGB  --   < > 10.2* 9.2* 9.2* 9.1*  PLT  --   < > 115* 88* 83* 80*  CREATININE 1.46*  --  1.44*  --   --  1.22  < > = values in this interval not displayed. Estimated Creatinine Clearance: 56.3 ml/min (by C-G formula based on Cr of 1.22). No results found for this basename: VANCOTROUGH, Corlis Leak, VANCORANDOM, GENTTROUGH, GENTPEAK, GENTRANDOM, TOBRATROUGH, TOBRAPEAK, TOBRARND, AMIKACINPEAK, AMIKACINTROU, AMIKACIN,  in the last 72 hours   Microbiology: Recent Results (from the past 720 hour(s))  MRSA PCR SCREENING     Status: None   Collection Time    11/01/13  9:37 PM      Result Value Ref Range Status   MRSA by PCR NEGATIVE  NEGATIVE Final   Comment:            The GeneXpert MRSA Assay (FDA     approved for NASAL specimens     only), is one component of a     comprehensive MRSA colonization     surveillance program. It is not     intended to diagnose MRSA     infection nor to guide or     monitor treatment for     MRSA infections.    Assessment: 73 year old male s/p LVAD 2 months ago s/p code blue on 3/8 began on empiric Vancomycin and Zosyn for rule-out sepsis with elevated LA and PCT.   Patient currently on day #3 of vancomycin and zosyn, s/p VATS in OR  yesterday, did receive prophylactic cefuroxime. Tmax overnight was 99.9, wbc stable at 7.2. Scr slowly improving to 1.2 with 0.9ml/kg/hr of uop. All culture data is pending.  3/9 resp [id number] 3/9 bld x2: pending  LVAD/afib - INR increased this am from 1.6>>2.2, last dose of warfarin was 3/8. Patient started on low dose IV heparin at 600 units/hr overnight. Aptt this am was 53, dose adjustments per surgery team.  Goal of Therapy:  Vancomycin trough level 15-20 mcg/ml  Plan:  Increase Vancomycin to 750mg  IV q12h Zosyn 3.375gm IV q8h extended infusion Monitor renal function closely  Check Vancomycin trough at steady state  Erin Hearing PharmD., BCPS Clinical Pharmacist Pager [phone] 11/07/2013 7:25 AM

## 2013-11-07 NOTE — Progress Notes (Signed)
PULMONARY  / CRITICAL CARE MEDICINE  Name: Johnny Oneill MRN: [id number] DOB: 1941-07-03    ADMISSION DATE:  11/01/2013 CONSULTATION DATE:  3/8  PRIMARY SERVICE: Cardiolgoy  CHIEF COMPLAINT:  Cardiogenic Shock  BRIEF PATIENT DESCRIPTION: 27 male with LVAD implanted 09/19/2013 with onset of shock, likely due to hemorrhage, necessitating intubation  SIGNIFICANT EVENTS / STUDIES:  3/8 CXR with with complete opacification of right hemothorax 3/9 improvement of right hemothorax s/p chest tube.  ET tube in place 3/9 CT Chest abd pelvis Endotracheal tube in grossly good position. Ventricular assistance device and left-sided pacemaker are noted. Right-sided chest tube has been placed with distal tip directed toward posterior portion of chest. Large right pleural effusion is noted with high density material layering within it concerning for hemorrhage  LINES / TUBES: [facility] CVL 3/5 Right Femoral CVL 3/5 Right Femoral A line 3/5  CULTURES: Pending blood cultures  ANTIBIOTICS: Zosyn 3/5>>> Vancomcyin 3/5>>>  SUBJECTIVE: Sedated and intubated, no events overnight.  VITAL SIGNS: Temp:  [97 F (36.1 C)-99.9 F (37.7 C)] 99.1 F (37.3 C) (03/10 0800) Pulse Rate:  [63-88] 78 (03/10 0800) Resp:  [0-25] 22 (03/10 0800) BP: (89-97)/(76-83) 89/76 mmHg (03/10 0321) SpO2:  [94 %-100 %] 95 % (03/10 0800) Arterial Line BP: (74-119)/(61-93) 94/81 mmHg (03/10 0800) FiO2 (%):  [50 %-60 %] 50 % (03/10 0800) Weight:  [68.539 kg (151 lb 1.6 oz)-77.9 kg (171 lb 11.8 oz)] 77.9 kg (171 lb 11.8 oz) (03/10 0600)  HEMODYNAMICS: CVP:  [2 mmHg-21 mmHg] 8 mmHg  VENTILATOR SETTINGS: Vent Mode:  [-] PRVC FiO2 (%):  [50 %-60 %] 50 % Set Rate:  [16 bmp-20 bmp] 16 bmp Vt Set:  [500 mL] 500 mL PEEP:  [5 cmH20] 5 cmH20 Plateau Pressure:  [24 cmH20-28 cmH20] 26 cmH20  INTAKE / OUTPUT: Intake/Output     03/09 0701 - 03/10 0700 03/10 0701 - 03/11 0700   P.O.     I.V. (mL/kg) 1292.6 (16.6) 34 (0.4)   Blood 1     NG/GT 20    IV Piggyback 1000    TPN     Total Intake(mL/kg) 2313.6 (29.7) 34 (0.4)   Urine (mL/kg/hr) 1040 (0.6) 100 (0.6)   Emesis/NG output 200 (0.1)    Blood 350 (0.2)    Chest Tube 295 (0.2) 20 (0.1)   Total Output 1885 120   Net +428.6 -86         PHYSICAL EXAMINATION: General: Chronically ill appearing male, sedated. Neuro: Sedated and intubated, moves all ext to pain. HEENT: [place]/AT, PERRL, EOMI, pale conjunctiva. Cardiovascular: RRR, Nl S1/S2, -M/R/G. Lungs: Coarse BS diffusely. Abdomen: Soft, NT, ND and +BS. Musculoskeletal:  No lower extremity edema, unable to palpate pulses Skin:  Pale, no ecchymosis, no evidence of retropertoneal bleed  LABS:  Recent Labs Lab 11/01/13 1033 11/01/13 1812 11/01/13 1838  11/02/13 0500  11/03/13 0742  11/05/13 2122  11/06/13 0156  11/06/13 0500  11/06/13 0535 11/06/13 0800  11/06/13 0855  11/06/13 1450 11/06/13 1500 11/06/13 2000 11/06/13 2018 11/06/13 2300 11/07/13 0400 11/07/13 0411  HGB 12.3* 12.5*  --   < > 10.9*  --  10.8*  < >  --   < >  --   --  11.4*  --   --  11.0*  --   --   < >  --  10.2* 9.2*  --  9.2* 9.1*  --   WBC 7.3 10.7*  --   < > 8.5  --  8.7  < >  --   < >  --   --  17.5*  --   --  15.1*  --   --   < >  --  11.3* 8.8  --  8.6 7.2  --   PLT 251 234  --   < > 240  --  248  < >  --   < >  --   --  131*  --   --  121*  --   --   < >  --  115* 88*  --  83* 80*  --   NA 139 139  --   < > 137  --  135*  < >  --   < >  --   --  135*  --   --   --   --  137  --   --  134*  --   --   --  138  --   K 4.4 4.3  --   < > 4.1  --  4.1  < >  --   < >  --   --  3.9  --   --   --   --  4.0  --   --  4.5  --   --   --  4.1  --   CL 102 102  --   < > 101  --  100  < >  --   < >  --   --  97  --   --   --   --  100  --   --  99  --   --   --  102  --   CO2 24 23  --   < > 23  --  25  < >  --   < >  --   --  21  --   --   --   --  23  --   --  23  --   --   --  21  --   GLUCOSE 99 116*  --   < > 96  --  119*  < >  --   < >   --   --  169*  --   --   --   --  144*  --   --  118*  --   --   --  97  --   BUN 16 17  --   < > 15  --  20  < >  --   < >  --   --  31*  --   --   --   --  32*  --   --  33*  --   --   --  33*  --   CREATININE 1.37* 1.37*  --   < > 1.21  --  1.06  < >  --   < >  --   --  1.51*  --   --   --   --  1.46*  --   --  1.44*  --   --   --  1.22  --   CALCIUM 8.6 8.8  --   < > 8.6  --  8.7  < >  --   < >  --   --  8.4  --   --   --   --  8.7  --   --  8.5  --   --   --  8.4  --   MG  --   --   --   < > 2.0  --  2.1  --   --   --   --   --   --   --   --   --   --   --   --   --   --   --   --   --  1.7  --   PHOS  --   --   --   < > 4.0  --  3.8  --   --   --   --   --   --   --   --   --   --   --   --   --   --   --   --   --  3.4  --   AST 36 36  --   < > 32  --  35  < >  --   < >  --   --  570*  --   --   --   --  490*  --   --   --   --   --   --  259*  --   ALT 27 28  --   < > 22  --  24  < >  --   < >  --   --  332*  --   --   --   --  312*  --   --   --   --   --   --  233*  --   ALKPHOS 134* 134*  --   < > 101  --  102  < >  --   < >  --   --  95  --   --   --   --  89  --   --   --   --   --   --  81  --   BILITOT 0.5 0.6  --   < > 0.7  --  0.4  < >  --   < >  --   --  1.4*  --   --   --   --  1.1  --   --   --   --   --   --  1.4*  --   PROT 7.7 7.6  --   < > 6.8  --  7.0  < >  --   < >  --   --  5.8*  --   --   --   --  5.8*  --   --   --   --   --   --  5.6*  --   ALBUMIN 3.1* 3.3*  --   < > 2.9*  --  2.8*  < >  --   < >  --   --  2.6*  --   --   --   --  2.6*  --   --   --   --   --   --  2.8*  --   APTT  --   --   --   --   --   --   --   --   --   --   --   --   --   --   --  32  --  31  --   --   --   --   --   --  53*  --   INR 3.74* 3.56*  --   < >  --   < > 2.09*  < >  --   < >  --   --  1.59*  --   --   --   --  1.62*  --   --   --   --   --   --  2.24*  --   LATICACIDVEN  --   --  1.31  --   --   --   --   --  3.2*  --  5.7*  --   --   --   --   --   --   --   --   --   --   --   --    --   --   --   TROPONINI  --  <0.30  --   --   --   --   --   --   --   --   --   --   --   --   --   --   --   --   --   --   --   --   --   --   --   --   PROCALCITON  --   --   --   --   --   --   --   --   --   --   --   --   --   --  2.81  --   --   --   --   --   --   --   --   --   --   --   PROBNP 877.6*  --   --   --   --   --   --   --   --   --   --   --   --   --   --   --   --   --   --   --   --   --   --   --   --   --   PHART  --   --   --   --   --   --   --   --   --   < >  --   < >  --   < >  --   --   < >  --   --  7.448  --   --  7.422  --   --  7.487*  PCO2ART  --   --   --   --   --   --   --   --   --   < >  --   < >  --   < >  --   --   < >  --   --  32.1*  --   --  36.0  --   --  29.8*  PO2ART  --   --   --   --   --   --   --   --   --   < >  --   < >  --   < >  --   --   < >  --   --  67.0*  --   --  85.0  --   --  70.7*  < > = values in this interval not displayed.  Recent Labs Lab 11/06/13 1942 11/06/13 2017 11/06/13 2354 11/07/13 0329 11/07/13 0734  GLUCAP 85 85 91 82 100*    CXR: Patient with right sided chest tube in place, ET tube in place, cardiomegaly, right subclavian line in place, noted right hemi opacity (improvment)  CT chest Complicated effusion, multiple denisties with layering consistent with blood in right thorax.  No gas pockets in fluid collection  ASSESSMENT / PLAN:  PULMONARY A:  Respiratory Failure Due to Cardiogenic/Hemorrhagic shock  P:   - Acidosis resolved - D/Ced bicarb drip and decreased RR to 16. - Hypoxic, increase PEEP to 10, if RUL is not resolved on CXR with PEEP may need to bronch and clear secretions from RUL and get cultures. - CXR and ABG in AM. - Continue full vent support. - Avoid dyssynchrony.  CARDIOVASCULAR A: Cardiogenic/Hemorrhagic Shock LVAD in place functioning appropriately with good flow Norepinephrine requirements trending downward with vasopressin supplementation (does not constrict pulmonary  vasculature in a patient with known RVHF) Off levo overnight. P:  - LVAD in place. - Only on vaso at this point with LVAD. - Needs diureses but recommend holding for today.  RENAL A:  AKI Acute renal failure Pre-Renal from hemorrhagic shock/Cardiogenic Shock.  Improving.  P: - Renal dose medications. - D/Ced Bicarb drip. - Maintain hemodynamics as able to preserve renal function. - Would not diurese aggressively today, will consider low dose lasix drip in AM pending hemodynamics as that will assist with liberation from mechanical ventilation.  GASTROINTESTINAL A:  Stress Ulcer Prophylaxis P:   - Protonix in place - TF per nutrition.  HEMATOLOGIC A:  Hemorrhagic shock Transfusion with FFP, partial reversal of anticoagulation and holding coumadin (risk of bleeding vs LVAD circuit clotting) P:  - Daily CBC.  INFECTIOUS A:  Possible sepsis Multiple sites of possible infection with CVL, LVAD, Urine, Lung with persistent opacity (although likely just evolving bleed) P:   - Zosyn/Vanc as ordered. - De-escalate antibiotics as cultures result. - Will consider bronch and cultures in AM if RUL remains consolidated on CXR with increase in PEEP.  ENDOCRINE A:  Glucose control   P:   - SSI in place  NEUROLOGIC A:  Hx of Delerium P:   - Versed and fentanyl drips as ordered.  I have personally obtained a history, examined the patient, evaluated laboratory and imaging results, formulated the assessment and plan and placed orders.  CRITICAL CARE: The patient is critically ill with multiple organ systems failure and requires high complexity decision making for assessment and support, frequent evaluation and titration of therapies, application of advanced monitoring technologies and extensive interpretation of multiple databases. Critical Care Time devoted to patient care services described in this note is 35 minutes.   Rush Farmer, M.D. [facility] Pulmonary/Critical Care  Medicine. Pager: [phone]. After hours pager: [phone].

## 2013-11-07 NOTE — Procedures (Addendum)
Procedure-flexible bronchoscopy at the bedside  Surgeon-Peter Prescott Gum M.D.  Indications--opacification right upper lobe, rule out mucus plugging or airway clot  Anesthesia IV conscious sedation, fentanyl and Versed  The patient underwent bedside fiberoptic bronchoscopy in the intensive care unit. through the endotracheal tube. There is a mild bloody secretions at the distal trachea. The left mainstem bronchus and left lobar airways were fairly clear. The right upper and middle lobe airways had some thick bloody secretions which were irrigated and removed and sent for culture. There are no discrete clots encountered. The patient tolerated the procedure well.

## 2013-11-07 NOTE — Progress Notes (Signed)
Pt placed on 100% Fi02 prior to bronchoscopy. Assisted Dr. Nils Pyle with bronch - no complcations noted. Pt remained stable throughout the procedure. Sample obtained via bronch labeled & sent to lab.

## 2013-11-07 NOTE — Progress Notes (Signed)
Patient ID: Johnny Oneill, male   DOB: 04/11/1941, 73 y.o.   MRN: [id number]  SICU Evening rounds  Hemodynamically stable but requiring levophed at 5 mcg since peep increased to 10. Vasopressin is off.   CVP is 17 but on 10 peep.  Urine output ok  Remains on vent, sedated but following commands.  If CXR is not better tomorrow he may need CT to decide if this opacity is lung related or pleural/extrapleural fluid.

## 2013-11-07 NOTE — Progress Notes (Signed)
PARENTERAL NUTRITION CONSULT NOTE: Re-Initiation  Pharmacy Consult:  TPN Indication: SBO  Allergies  Allergen Reactions  . Diovan [Valsartan] Other (See Comments)    Hypotension at low dose, hyperkalemia  . Lipitor [Atorvastatin Calcium] Other (See Comments)    Muscle pain    Patient Measurements: Height: 5\' 9"  (175.3 cm) Weight: 171 lb 11.8 oz (77.9 kg) IBW/kg (Calculated) : 70.7 Usual Weight: 68 kg  Vital Signs: Temp: 99.3 F (37.4 C) (03/10 1000) Temp src: Core (Comment) (03/10 1000) BP: 89/76 mmHg (03/10 0321) Pulse Rate: 73 (03/10 1030) Intake/Output from previous day: 03/09 0701 - 03/10 0700 In: 2313.6 [I.V.:1292.6; Blood:1; NG/GT:20; IV Piggyback:1000] Out: 1885 [id number]; Emesis/NG output:200; Blood:350; Chest Tube:295]  Labs:  Recent Labs  11/06/13 0500 11/06/13 0800 11/06/13 0855  11/06/13 2000 11/06/13 2300 11/07/13 0400  WBC 17.5* 15.1*  --   < > 8.8 8.6 7.2  HGB 11.4* 11.0*  --   < > 9.2* 9.2* 9.1*  HCT 32.4* 31.1*  --   < > 26.1* 25.8* 25.6*  PLT 131* 121*  --   < > 88* 83* 80*  APTT  --  32 31  --   --   --  53*  INR 1.59*  --  1.62*  --   --   --  2.24*  < > = values in this interval not displayed.   Recent Labs  11/06/13 0500 11/06/13 0855 11/06/13 1500 11/07/13 0400  NA 135* 137 134* 138  K 3.9 4.0 4.5 4.1  CL 97 100 99 102  CO2 21 23 23 21   GLUCOSE 169* 144* 118* 97  BUN 31* 32* 33* 33*  CREATININE 1.51* 1.46* 1.44* 1.22  CALCIUM 8.4 8.7 8.5 8.4  MG  --   --   --  1.7  PHOS  --   --   --  3.4  PROT 5.8* 5.8*  --  5.6*  ALBUMIN 2.6* 2.6*  --  2.8*  AST 570* 490*  --  259*  ALT 332* 312*  --  233*  ALKPHOS 95 89  --  81  BILITOT 1.4* 1.1  --  1.4*   Estimated Creatinine Clearance: 56.3 ml/min (by C-G formula based on Cr of 1.22).    Recent Labs  11/06/13 2354 11/07/13 0329 11/07/13 0734  GLUCAP 91 82 100*     Insulin Requirements in the past 24 hours:  2 unit sensitive SSI/24h  Assessment: 69 YOM with history  of recent LVAD and discharged on 09/19/13.  Patient returned with N/V and CT scan revealed SBO.   GI:  NGT d/c'ed 3/6 with resolving SBO.  Had flatus and several BMs. TPN d/c'd 3/7. Resume TPN today 3/10 for inability to take po's/TFs and high NG output . Emesis/NG=350. No recent BM noted since 3/6.  Endo: hypothyroid on Synthroid (dose increased).  No hx DM - CBGs controlled  Lytes: WNL today.  Renal: Scr 1.22. UOP 0.6  Cards: CAD s/p stent to LCX 2004 with re-occlusion of LCX and lateral MI 2009 and repeat stenting, total occlusion of RCA, ICM, chronic sHF, s/p LVAD 09/19/13 -Cardiac arrest 3/8 with PEA with R hemothorax. Cardiogenic/hemorrhagic shock. 3/9: right VATS and evacuation of hematoma-hemothorax  Hepatobil: Shock liver s/p PEA arrest 3/8. Tbili 1.4 with elevated LFTs. INR 2.24.  Heme: Hgb 9.1. Plts only 80.  Resp: VDRF. Has R-sided chest tube and large R pleural effusion concerning for hemorrhage (on 3/9 CT). Fent/Versed drips. FiO100%. Chest tube with 275 output.  ID:  Vanco/Zosyn #6. WBc 7.1 Tc=99.3  Anticoag: LVAD. Coumadin PTA with INR 3.5 upon admit. Reversed. Heparin drip for LVAD low dose at 1ml/hr. INR at 2.24 due to shock liver.  Best Practices: PPI IV, MC,  TPN Access: CVC placed 11/02/13  TPN day#: 3/5-3/8, resume 3/10  Current Nutrition:  TPN at goal 67ml/hr = 1894kcal and 99.6g protein  Nutritional Goals:  1700-1800 kcal and 105-115g protein per RD note 3/9.   Plan:  - Resume Clinimix E at 77ml/hr and increase to goal rate in AM if CBGs and eletrolytes ok. - Daily IV multivitamin in TPN - Trace elements on M/W/F due to [employer] S. Alford Highland, PharmD, [facility] Clinical Staff Pharmacist Pager [phone]  11/07/2013, 10:37 AM

## 2013-11-07 NOTE — Progress Notes (Addendum)
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 tach status post AICD   3 readmission March 2015 for partial small bowel obstruction, now resolved    4 right hemothorax associated with resuming IV heparin for adequate anticoagulation while he was not taking his oral Coumadin with subsequent cardiac arrest successfully resuscitated    5 consolidation of right upper lobe following right VATS and evacuation of hematoma   Patient currently intubated and hemodynamically stable His hemoglobin is stable platelet count is reduced at 80,000 The drainage of the pleural space established however right upper lobe remains with a consolidated pattern on x-ray Abdomen mildly distended no bowel sounds with contrast in the colon-probable ileus INR today 2.3, low-dose heparin started to cover LVAD pump while INR levels fluctuate Broad-spectrum antibiotics started for possible pneumonia and elevated calcitonin to indicate possible sepsis 2-D echocardiogram on this admission social good RV function and good inflow cannula position Patient currently on 10 of PEEP and is being followed by CCM for vent management   LVAD INTERROGATION:  HeartMate II LVAD:  Flow   4.2 liters/min, speed 9400 RPM, power 5.1 PI 4.1  Objective:    Vital Signs:   Temp:  [97 F (36.1 C)-99.9 F (37.7 C)] 99.3 F (37.4 C) (03/10 1000) Pulse Rate:  [68-88] 73 (03/10 1030) Resp:  [0-25] 19 (03/10 1030) BP: (89-97)/(76-83) 89/76 mmHg (03/10 0321) SpO2:  [94 %-100 %] 100 % (03/10 1030) Arterial Line BP: (70-119)/(56-93) 99/83 mmHg (03/10 1030) FiO2 (%):  [50 %-100 %] 100 % (03/10 0955) Weight:  [171 lb 11.8 oz (77.9 kg)] 171 lb 11.8 oz (77.9 kg) (03/10 0600) Last BM Date: 11/04/13 Mean arterial Pressure 85  Intake/Output:   Intake/Output Summary (Last 24 hours) at 11/07/13 1112 Last data filed at 11/07/13 0900  Gross per 24 hour  Intake 1850.3 ml  Output   1660 ml  Net  190.3  ml     Physical Exam: General:  Well appearing. No resp difficulty on ventilator sedated but color is improved HEENT: normal Neck: supple. JVP  no bruits. No lymphadenopathy or thryomegaly appreciated. Cor: Mechanical heart sounds with LVAD hum present. Lungs: Coarse breath sounds right greater than left Abdomen: soft, nontender, mildly distended. No hepatosplenomegaly. No bruits or masses diminished bowel sounds. Driveline: C/D/I; securement device intact and driveline incorporated Extremities: no cyanosis, clubbing, rash, mild edema present extremities warm Neuro: Responsive on vent neuro grossly intact Telemetry sinus rhythm 98  Labs: Basic Metabolic Panel:  Recent Labs Lab 11/02/13 0355 11/02/13 0500 11/03/13 0742  11/06/13 0132 11/06/13 0500 11/06/13 0855 11/06/13 1500 11/07/13 0400  NA 137 137 135*  < > 137 135* 137 134* 138  K 4.3 4.1 4.1  < > 3.8 3.9 4.0 4.5 4.1  CL 102 101 100  < > 98 97 100 99 102  CO2 22 23 25   < > 18* 21 23 23 21   GLUCOSE 117* 96 119*  < > 261* 169* 144* 118* 97  BUN 14 15 20   < > 29* 31* 32* 33* 33*  CREATININE 1.12 1.21 1.06  < > 1.47* 1.51* 1.46* 1.44* 1.22  CALCIUM 8.8 8.6 8.7  < > 8.7 8.4 8.7 8.5 8.4  MG 2.1 2.0 2.1  --   --   --   --   --  1.7  PHOS 3.5 4.0 3.8  --   --   --   --   --  3.4  < > = values in this interval not displayed.  Liver Function Tests:  Recent Labs Lab 11/05/13 0330 11/06/13 0132 11/06/13 0500 11/06/13 0855 11/07/13 0400  AST 24 538* 570* 490* 259*  ALT 16 293* 332* 312* 233*  ALKPHOS 85 102 95 89 81  BILITOT 0.3 1.3* 1.4* 1.1 1.4*  PROT 6.5 6.3 5.8* 5.8* 5.6*  ALBUMIN 2.6* 2.6* 2.6* 2.6* 2.8*    Recent Labs Lab 11/01/13 1812  LIPASE 39   No results found for this basename: AMMONIA,  in the last 168 hours  CBC:  Recent Labs Lab 11/01/13 1812  11/03/13 0742  11/06/13 1104 11/06/13 1500 11/06/13 2000 11/06/13 2300 11/07/13 0400  WBC 10.7*  < > 8.7  < > 12.0* 11.3* 8.8 8.6 7.2  NEUTROABS  8.8*  --  6.5  --   --   --   --   --   --   HGB 12.5*  < > 10.8*  < > 10.0* 10.2* 9.2* 9.2* 9.1*  HCT 38.7*  < > 34.4*  < > 28.6* 29.3* 26.1* 25.8* 25.6*  MCV 85.6  < > 85.8  < > 83.9 84.2 84.5 84.0 84.8  PLT 234  < > 248  < > 118* 115* 88* 83* 80*  < > = values in this interval not displayed.  INR:  Recent Labs Lab 11/05/13 0330 11/06/13 0132 11/06/13 0500 11/06/13 0855 11/07/13 0400  INR 1.33 1.57* 1.59* 1.62* 2.24*    Other results:  EKG:   Imaging: Ct Abdomen Pelvis Wo Contrast  11/06/2013   CLINICAL DATA:  Status post code, possible pulmonary hemorrhage.  EXAM: CT CHEST, ABDOMEN AND PELVIS WITHOUT CONTRAST  TECHNIQUE: Multidetector CT imaging of the chest, abdomen and pelvis was performed following the standard protocol without IV contrast.  COMPARISON:  None.  FINDINGS: CT CHEST FINDINGS  Endotracheal tube is in grossly good position. Ventricular assist device is again noted. Nasogastric tube is seen passing through the esophagus into proximal stomach. Minimal left pleural effusion is noted with adjacent subsegmental atelectasis. No pneumothorax is seen. Right-sided chest tube is noted with tip directed poor posterior chest. Large right pleural effusion is noted with high density material layering within it concerning for hemorrhage. Left-sided pacemaker is noted with leads in grossly good position. Sternotomy wires are noted.  CT ABDOMEN AND PELVIS FINDINGS  No focal abnormality seen in the liver, spleen or pancreas on these unenhanced images. Small gallstones are noted in neck of gallbladder. Adrenal glands appear normal. No hydronephrosis or renal obstruction is noted. No renal or ureteral calculi are noted. Atherosclerotic calcifications of abdominal aorta are noted without aneurysm formation. No bowel obstruction is noted. Sigmoid diverticulosis is noted without inflammation. Urinary bladder is decompressed secondary to Foley catheter. No significant adenopathy is noted.   IMPRESSION: Endotracheal tube in grossly good position. Ventricular assistance device and left-sided pacemaker are noted. Right-sided chest tube has been placed with distal tip directed toward posterior portion of chest. Large right pleural effusion is noted with high density material layering within it concerning for hemorrhage.  Minimal cholelithiasis is noted. Sigmoid diverticulosis is noted without inflammation. No other significant abnormality seen in the abdomen or pelvis.   Electronically Signed   By: Sabino Dick M.D.   On: 11/06/2013 01:55   Ct Chest Wo Contrast  11/06/2013   CLINICAL DATA:  Status post code, possible pulmonary hemorrhage.  EXAM: CT CHEST, ABDOMEN AND PELVIS WITHOUT CONTRAST  TECHNIQUE: Multidetector CT imaging of the  chest, abdomen and pelvis was performed following the standard protocol without IV contrast.  COMPARISON:  None.  FINDINGS: CT CHEST FINDINGS  Endotracheal tube is in grossly good position. Ventricular assist device is again noted. Nasogastric tube is seen passing through the esophagus into proximal stomach. Minimal left pleural effusion is noted with adjacent subsegmental atelectasis. No pneumothorax is seen. Right-sided chest tube is noted with tip directed poor posterior chest. Large right pleural effusion is noted with high density material layering within it concerning for hemorrhage. Left-sided pacemaker is noted with leads in grossly good position. Sternotomy wires are noted.  CT ABDOMEN AND PELVIS FINDINGS  No focal abnormality seen in the liver, spleen or pancreas on these unenhanced images. Small gallstones are noted in neck of gallbladder. Adrenal glands appear normal. No hydronephrosis or renal obstruction is noted. No renal or ureteral calculi are noted. Atherosclerotic calcifications of abdominal aorta are noted without aneurysm formation. No bowel obstruction is noted. Sigmoid diverticulosis is noted without inflammation. Urinary bladder is decompressed  secondary to Foley catheter. No significant adenopathy is noted.  IMPRESSION: Endotracheal tube in grossly good position. Ventricular assistance device and left-sided pacemaker are noted. Right-sided chest tube has been placed with distal tip directed toward posterior portion of chest. Large right pleural effusion is noted with high density material layering within it concerning for hemorrhage.  Minimal cholelithiasis is noted. Sigmoid diverticulosis is noted without inflammation. No other significant abnormality seen in the abdomen or pelvis.   Electronically Signed   By: Sabino Dick M.D.   On: 11/06/2013 01:55   Dg Chest Port 1 View  11/07/2013   CLINICAL DATA:  Left ventricular assist device  EXAM: PORTABLE CHEST - 1 VIEW  COMPARISON:  11/06/2013  FINDINGS: Dense opacity in the mid and right upper lung zone is stable consistent with a loculated effusion. There is vascular congestion and interstitial thickening bilaterally, also stable. Changes from cardiac surgery and left ventricular assist device placement are again noted. No mediastinal widening.  No pneumothorax. Right inferior hemi thorax chest tube, endotracheal tube, right internal jugular central venous line and left anterior chest wall AICD are all stable.  IMPRESSION: No change from the previous day's study allowing for differences in patient positioning and technique. Persistent loculated right mid and upper hemi thorax pleural effusion. Persistent bilateral irregular interstitial thickening.  Support apparatus is stable and well positioned.   Electronically Signed   By: Lajean Manes M.D.   On: 11/07/2013 08:15   Dg Chest Port 1 View  11/06/2013   CLINICAL DATA:  Post right VATS for evacuation of hematoma  EXAM: PORTABLE CHEST - 1 VIEW  COMPARISON:  Portable exam 1521 hr compared to 1023 hr  FINDINGS: Tip of endotracheal tube projects 2.0 cm above carinal.  Nasogastric tube extends into stomach.  Tip of right jugular central venous catheter  projects over SVC.  Left ventricular assist device and AICD leads unchanged.  External pacing leads again noted.  Change in right thoracostomy tube.  Persistent opacity at the upper right chest likely pleural-based fluid collection.  Persistent bibasilar opacities.  IMPRESSION: No significant interval change.   Electronically Signed   By: Lavonia Dana M.D.   On: 11/06/2013 15:39   Dg Chest Port 1 View  11/06/2013   CLINICAL DATA:  Ventricular assist device.  EXAM: PORTABLE CHEST - 1 VIEW  COMPARISON:  11/06/2013 at 6:10 a.m.  FINDINGS: Been cyst device stable. The loculated right upper hemi thorax pleural effusion is also stable. No  new lung opacities. No pneumothorax.  Endotracheal tube, right chest tube, right subclavian central venous line, nasogastric tube and left anterior chest wall AICD are also stable.  IMPRESSION: 1. No change from the earlier exam. 2. Persistent loculated right upper hemi thorax pleural effusion. 3. Support apparatus is stable in well positioned.   Electronically Signed   By: Lajean Manes M.D.   On: 11/06/2013 12:59   Dg Chest Port 1 View  11/06/2013   CLINICAL DATA:  Hemothorax, followup, past history coronary artery disease, CHF, ischemic cardiomyopathy, MI, arrhythmia, AICD, left ventricular assist device  EXAM: PORTABLE CHEST - 1 VIEW  COMPARISON:  Portable exam 0610 hr compared to 11/06/2013  FINDINGS: Left ventricular assist device again noted.  Tip of endotracheal tube projects 4.0 cm above carina.  Nasogastric tube extends into stomach.  Right thoracostomy tube unchanged.  Right subclavian central venous catheter tip projects over distal SVC.  Left subclavian AICD leads project over right atrium and right ventricle.  External pacing leads present.  Enlargement of cardiac silhouette post median sternotomy.  Persistent loculated pleural effusion by history hemothorax upper right chest.  Increased atelectasis versus infiltrate in right lower lobe.  Minimal left basilar  atelectasis.  No gross pneumothorax.  IMPRESSION: Persistent loculated right pleural effusion/hemothorax.  Slightly increased right basilar atelectasis versus infiltrate.  Minimal left basilar atelectasis.   Electronically Signed   By: Lavonia Dana M.D.   On: 11/06/2013 08:10   Dg Chest Port 1 View  11/06/2013   CLINICAL DATA:  Right-sided chest tube placement.  EXAM: PORTABLE CHEST - 1 VIEW  COMPARISON:  November 05, 2013.  FINDINGS: Stable cardiomegaly. Right-sided PICC line is unchanged in position. Left-sided pacemaker is again noted and unchanged. Ventricular assistance device is again noted. Endotracheal tube is seen with distal tip approximately 3 cm above the carina. Nasogastric tube is seen passing through the esophagus and into the stomach. Interval placement of right-sided chest tube. No pneumothorax is seen there remains opacification the right hemi thorax, but pleural effusion appears to be improved.  IMPRESSION: Endotracheal tube is in grossly good position. Interval placement of right-sided chest tube without evidence of pneumothorax; right pleural effusion noted on prior exam appears to be slightly improved with increased aeration of right lower lobe.   Electronically Signed   By: Sabino Dick M.D.   On: 11/06/2013 01:25   Dg Chest Port 1 View  11/05/2013   CLINICAL DATA:  Shortness of breath.  EXAM: PORTABLE CHEST - 1 VIEW  COMPARISON:  November 04, 2013.  FINDINGS: Sternotomy wires are noted. Ventricular assist device is again noted. Left-sided pacemaker is unchanged in position. Right-sided PICC line is unchanged in position. There is now complete opacification of the right hemothorax due to pneumonia or effusion. No mediastinal shift is seen at this time.  IMPRESSION: Complete opacification of the right hemothorax is now noted concerning for pneumonia, atelectasis or effusion.   Electronically Signed   By: Sabino Dick M.D.   On: 11/05/2013 23:03   Dg Abd Portable 1v  11/07/2013   CLINICAL DATA:   Left ventricular assist device, ileus versus small-bowel obstruction  EXAM: PORTABLE ABDOMEN - 1 VIEW  COMPARISON:  Prior abdominal radiograph 11/04/2013; CT chest/abdomen/pelvis 11/06/2013  FINDINGS: Unremarkable common nonobstructed bowel gas pattern. There is a moderate volume of formed stool in the transverse colon. No large free air on this single supine radiograph. The tip of a right femoral central venous catheter projects over the right SI joint likely  in the common femoral vein. A left ventricular assist device is present. Leads from a cardiac rhythm maintenance device are also noted. Bibasilar atelectasis versus infiltrate. Left basilar chest tube. No acute osseous abnormality. A nasogastric tube is noted within the stomach.  IMPRESSION: 1. No evidence of obstruction or ileus. 2. Moderate volume of formed stool in the transverse colon. 3. The nasogastric tube is well positioned in the stomach. 4. Additional support apparatus in satisfactory position as above.   Electronically Signed   By: Jacqulynn Cadet M.D.   On: 11/07/2013 08:15      Medications:     Scheduled Medications: . acetylcysteine  4 mL Per Tube Once  . antiseptic oral rinse  15 mL Mouth Rinse Q4H  . bisacodyl  10 mg Rectal Once  . chlorhexidine  15 mL Mouth Rinse BID  . insulin aspart  0-9 Units Subcutaneous TID WC  . levothyroxine  100 mcg Intravenous Daily  . metoCLOPramide (REGLAN) injection  10 mg Intravenous 4 times per day  . pantoprazole (PROTONIX) IV  40 mg Intravenous Daily  . piperacillin-tazobactam (ZOSYN)  IV  3.375 g Intravenous 3 times per day  . sodium chloride  10-40 mL Intracatheter Q12H  . vancomycin  750 mg Intravenous Q12H     Infusions: . Marland KitchenTPN (CLINIMIX-E) Adult     And  . fat emulsion    . fentaNYL infusion INTRAVENOUS 75 mcg/hr (11/07/13 0900)  . heparin 600 Units/hr (11/07/13 0900)  . midazolam (VERSED) infusion 1 mg/hr (11/07/13 0900)  . norepinephrine (LEVOPHED) Adult infusion Stopped  (11/06/13 2330)  .  sodium bicarbonate  infusion 1000 mL Stopped (11/06/13 0930)  . vasopressin (PITRESSIN) infusion - *FOR SHOCK* 0.02 Units/min (11/07/13 0900)     PRN Medications:  diphenhydrAMINE, fentaNYL, LORazepam, ondansetron (ZOFRAN) IV, phenol, sodium chloride   Assessment:  Partial small bowel obstruction resolved Acute right hemothorax with subsequent hypokalemia and cardiac arrest now improved with stable hemoglobin and improvement and LFTs and creatinine Ventilator dependent respiratory failure with consolidative changes of right upper lobe-bronchoscopy performed to rule out obstruction or clot obstructing airway Malnutrition with poor oral intake secondary to recent partial small bowel obstruction, now patient is intubated with ileus so  we'll start TPN Low-dose IV heparin with PTT goal 50-60  Plan/Discussion:      I reviewed the LVAD parameters from today, and compared the results to the patient's prior recorded data.  No programming changes were made.  The LVAD is functioning within specified parameters.  The patient performs LVAD self-test daily.  LVAD interrogation was negative for any significant power changes, alarms or PI events/speed drops.  LVAD equipment check completed and is in good working order.  Back-up equipment present.   LVAD education done on emergency procedures and precautions and reviewed exit site care.  Length of Stay: 6  VAN TRIGT III,Obbie Lewallen 11/07/2013, 11:12 AM  VAD Team Pager [phone] (7am - 7am)

## 2013-11-07 NOTE — Progress Notes (Addendum)
NUTRITION CONSULT/FOLLOW UP  INTERVENTION: TPN per pharmacy RD to follow for nutrition care plan  NUTRITION DIAGNOSIS: Inadequate oral intake related to inability to eat as evidenced by NPO status, ongoing  Goal: Pt to meet >/= 90% of their estimated nutrition needs, progressing  Monitor:  TPN prescription, respiratory status, weight, labs, I/O's  ASSESSMENT: Patient is s/p LVAD on 09/19/2013; sent to the ED b/c of episodes of nausea vomiting and abdominal pain.   CT of abdomen/pelvis: dilated fluid and gas-filled stomach. Contrast extends into nondilated duodenum. Beyond this region, fluid and gas-filled dilated loops of small bowel consistent with SBO.   Patient s/p procedure 3/9: EMERGENT RIGHT CHEST TUBE PLACEMENT  Patient is currently intubated on ventilator support -- NGT in place MV: 9.3 L/min Temp (24hrs), Avg:98.8 F (37.1 C), Min:97 F (36.1 C), Max:99.9 F (37.7 C)   Vital AF 1.2 formula D/C'd 3/9, 1459.  Pt with high NG output.  TPN to be resumed tonight.  RD re-consulted.  Patient to receive TPN with Clinimix E 5/15 @ 40 ml/hr and lipids @ 10 ml/hr. Provides 1162 kcal and 48 grams protein per day. Meets 66% minimum estimated energy needs and 46% minimum estimated protein needs.  Height: Ht Readings from Last 1 Encounters:  11/06/13 5\' 9"  (1.753 m)    Weight ----> increased today Wt Readings from Last 1 Encounters:  11/07/13 171 lb 11.8 oz (77.9 kg)    3/09  151 lb 3/08  151 lb 3/07  156 lb 3/06  156 lb 3/05  151 lb 3/04  151 lb    BMI:  Body mass index is 25.35 kg/(m^2).  Re-estimated needs: Kcal: 1750-1850 Protein: 105-115 gm Fluid: per MD  Skin: Intact  Diet Order: NPO   Intake/Output Summary (Last 24 hours) at 11/07/13 1332 Last data filed at 11/07/13 1300  Gross per 24 hour  Intake 1700.67 ml  Output   1825 ml  Net -124.33 ml    Labs:   Recent Labs Lab 11/02/13 0500 11/03/13 0742  11/06/13 0855 11/06/13 1500  11/07/13 0400  NA 137 135*  < > 137 134* 138  K 4.1 4.1  < > 4.0 4.5 4.1  CL 101 100  < > 100 99 102  CO2 23 25  < > 23 23 21   BUN 15 20  < > 32* 33* 33*  CREATININE 1.21 1.06  < > 1.46* 1.44* 1.22  CALCIUM 8.6 8.7  < > 8.7 8.5 8.4  MG 2.0 2.1  --   --   --  1.7  PHOS 4.0 3.8  --   --   --  3.4  GLUCOSE 96 119*  < > 144* 118* 97  < > = values in this interval not displayed.  Scheduled Meds: . acetylcysteine  4 mL Per Tube Once  . antiseptic oral rinse  15 mL Mouth Rinse Q4H  . chlorhexidine  15 mL Mouth Rinse BID  . insulin aspart  0-9 Units Subcutaneous TID WC  . levothyroxine  100 mcg Intravenous Daily  . metoCLOPramide (REGLAN) injection  10 mg Intravenous 4 times per day  . pantoprazole (PROTONIX) IV  40 mg Intravenous Daily  . piperacillin-tazobactam (ZOSYN)  IV  3.375 g Intravenous 3 times per day  . sodium chloride  10-40 mL Intracatheter Q12H  . vancomycin  750 mg Intravenous Q12H    Continuous Infusions: . Marland KitchenTPN (CLINIMIX-E) Adult     And  . fat emulsion    . fentaNYL infusion INTRAVENOUS  75 mcg/hr (11/07/13 1300)  . heparin 600 Units/hr (11/07/13 1200)  . midazolam (VERSED) infusion 1 mg/hr (11/07/13 1300)  . norepinephrine (LEVOPHED) Adult infusion 7.04 mcg/min (11/07/13 1300)  .  sodium bicarbonate  infusion 1000 mL Stopped (11/06/13 0930)  . vasopressin (PITRESSIN) infusion - *FOR SHOCK* 0.03 Units/min (11/07/13 1300)    Past Medical History  Diagnosis Date  . Ischemic cardiomyopathy     a. 10/09 Echo: Sev LV Dysfxn, inf/lat AK, Mod MR.  [name];  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 Hypertension   . PVC (premature  ventricular contraction)   . Pacemaker   . CHF (congestive heart failure)   . Shortness of breath   . Automatic implantable cardioverter-defibrillator in situ     Past Surgical History  Procedure Laterality Date  . Defibrillator  2009  . Total knee arthroplasty  11/27/11    left  . Insert / replace / remove pacemaker  2009  . Coronary angioplasty with stent placement  2004  . Coronary angioplasty with stent placement  07/2008  . Knee arthroscopy w/ partial medial meniscectomy  12/2002    left  . Knee arthroscopy      bilaterally  . Total knee arthroplasty  11/27/2011    Procedure: TOTAL KNEE ARTHROPLASTY;  Surgeon: Yvette Rack., MD;  Location: [facility];  Service: Orthopedics;  Laterality: Left;  left total knee arthroplasty  . Esophagogastroduodenoscopy N/A 09/15/2013    Procedure: ESOPHAGOGASTRODUODENOSCOPY (EGD);  Surgeon: Ladene Artist, MD;  Location: [facility] ENDOSCOPY;  Service: Endoscopy;  Laterality: N/A;  bedside  . Insertion of implantable left ventricular assist device N/A 09/18/2013    Procedure: INSERTION OF IMPLANTABLE LEFT VENTRICULAR ASSIST DEVICE;  Surgeon: Ivin Poot, MD;  Location: [facility];  Service: Open Heart Surgery;  Laterality: N/A;  CIRC ARREST  NITRIC OXIDE  . Intraoperative transesophageal echocardiogram N/A 09/18/2013    Procedure: INTRAOPERATIVE TRANSESOPHAGEAL ECHOCARDIOGRAM;  Surgeon: Ivin Poot, MD;  Location: [facility];  Service: Open Heart Surgery;  Laterality: N/A;    Arthur Holms, RD, LDN Pager #: [phone] After-Hours Pager #: [phone]

## 2013-11-07 NOTE — Progress Notes (Signed)
RT Note-placed on 100% for bronchoscopy with Dr. Prescott Gum. Peep increased to 10 by Dr. Chuck Hint.

## 2013-11-07 NOTE — Progress Notes (Signed)
Advanced Heart Failure Team Rounding Note     Subjective:    Mr.Kram is a 73 year old patient with a history of CAD s/p stent to LCX 2004 with re-occlusion of LCX with lateral MI 2009 and repeat stenting, total occlusion of RCA, ICM, chronic systolic heart failure, hypothyroidism, HLD and arthritis. 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   Admitted to [facility] 09/13/13 through 10/03/13. Initially admitted with decompenstaed heart failure and low output. Required IABP for stabilization. He ultimately had LVAD HM II placed on 09/19/13. He suffered from RV failure after the procedure and required milrinone for >7 days which was gradually weaned to off. During his stay he did develop acute ICU psychosis with delirium which was treated with Zyprexa. Once he was transferred out of the ICU this improved and he returned back to baseline. He was not discharged on beta blocker. D/C weight 151 pounds.   Admitted on 3/4 with SBO/ileus. Was improving then had cardiac arrest last night (3/8) with PEA in setting of R hemothorax. Now s/p R chest tube and multiple units transfusion.   Underwent VATs yesterday. Remains intubated. Off levophed.  Weaning vasopressin. Hgb stable at 9.1. Good urine output. Cr 1.2. LFTs trending down.   LVAD INTERROGATION:  HeartMate II LVAD: Flow 4.7 liters/min, speed 9400, power 5.0, PI 4.5  Low flow alarms from overnight  I reviewed the LVAD parameters from today, and compared the results to the patient's prior recorded data. No programming changes were made. The LVAD is functioning within specified parameters. The patient performs LVAD self-test daily. LVAD interrogation was negative for any significant power changes, alarms or PI events/speed drops. LVAD equipment check completed and is in good working order. Back-up equipment present. LVAD education done on emergency procedures and precautions and reviewed exit site care.   Physical Exam: GENERAL: intubated.  sedated HEENT: normal  +ET NECK: Supple CARDIAC: Mechanical heart sounds with LVAD hum present.  LUNGS: R chest tube. + rhonchi. Decreased BS on R.  ABDOMEN: Soft, + distended, nontender, hypoactive bowel sounds LVAD exit site: well-healed and incorporated. Dressing dry and intact. No erythema or drainage. Stabilization device present and accurately applied. Driveline dressing is being changed daily per sterile technique.  EXTREMITIES: Warm and dry, no cyanosis, clubbing, rash or edema  NEUROLOGIC: Alert and oriented x 4. Gait steady. No aphasia. No dysarthria. Affect pleasant.    Telemetry: SR   Labs: Basic Metabolic Panel:  Recent Labs Lab 11/02/13 0355 11/02/13 0500 11/03/13 0742  11/06/13 0132 11/06/13 0500 11/06/13 0855 11/06/13 1500 11/07/13 0400  NA 137 137 135*  < > 137 135* 137 134* 138  K 4.3 4.1 4.1  < > 3.8 3.9 4.0 4.5 4.1  CL 102 101 100  < > 98 97 100 99 102  CO2 22 23 25   < > 18* 21 23 23 21   GLUCOSE 117* 96 119*  < > 261* 169* 144* 118* 97  BUN 14 15 20   < > 29* 31* 32* 33* 33*  CREATININE 1.12 1.21 1.06  < > 1.47* 1.51* 1.46* 1.44* 1.22  CALCIUM 8.8 8.6 8.7  < > 8.7 8.4 8.7 8.5 8.4  MG 2.1 2.0 2.1  --   --   --   --   --  1.7  PHOS 3.5 4.0 3.8  --   --   --   --   --  3.4  < > = values in this interval not displayed.  Liver Function Tests:  Recent Labs Lab 11/05/13 0330 11/06/13 0132 11/06/13 0500 11/06/13 0855 11/07/13 0400  AST 24 538* 570* 490* 259*  ALT 16 293* 332* 312* 233*  ALKPHOS 85 102 95 89 81  BILITOT 0.3 1.3* 1.4* 1.1 1.4*  PROT 6.5 6.3 5.8* 5.8* 5.6*  ALBUMIN 2.6* 2.6* 2.6* 2.6* 2.8*    Recent Labs Lab 11/01/13 1812  LIPASE 39   No results found for this basename: AMMONIA,  in the last 168 hours  CBC:  Recent Labs Lab 11/01/13 1812  11/03/13 0742  11/06/13 1104 11/06/13 1500 11/06/13 2000 11/06/13 2300 11/07/13 0400  WBC 10.7*  < > 8.7  < > 12.0* 11.3* 8.8 8.6 7.2  NEUTROABS 8.8*  --  6.5  --   --   --   --   --    --   HGB 12.5*  < > 10.8*  < > 10.0* 10.2* 9.2* 9.2* 9.1*  HCT 38.7*  < > 34.4*  < > 28.6* 29.3* 26.1* 25.8* 25.6*  MCV 85.6  < > 85.8  < > 83.9 84.2 84.5 84.0 84.8  PLT 234  < > 248  < > 118* 115* 88* 83* 80*  < > = values in this interval not displayed.  Cardiac Enzymes:  Recent Labs Lab 11/01/13 1812  TROPONINI <0.30    BNP: BNP (last 3 results)  Recent Labs  09/13/13 1349 09/25/13 0400 11/01/13 1033  PROBNP 3883.0* 7019.0* 877.6*    CBG:  Recent Labs Lab 11/06/13 1742 11/06/13 1942 11/06/13 2017 11/06/13 2354 11/07/13 0329  GLUCAP 81 85 85 91 82    Coagulation Studies:  Recent Labs  11/05/13 0330 11/06/13 0132 11/06/13 0500 11/06/13 0855 11/07/13 0400  LABPROT 16.2* 18.3* 18.5* 18.8* 24.1*  INR 1.33 1.57* 1.59* 1.62* 2.24*    Other results:   Imaging: Ct Abdomen Pelvis Wo Contrast  11/06/2013   CLINICAL DATA:  Status post code, possible pulmonary hemorrhage.  EXAM: CT CHEST, ABDOMEN AND PELVIS WITHOUT CONTRAST  TECHNIQUE: Multidetector CT imaging of the chest, abdomen and pelvis was performed following the standard protocol without IV contrast.  COMPARISON:  None.  FINDINGS: CT CHEST FINDINGS  Endotracheal tube is in grossly good position. Ventricular assist device is again noted. Nasogastric tube is seen passing through the esophagus into proximal stomach. Minimal left pleural effusion is noted with adjacent subsegmental atelectasis. No pneumothorax is seen. Right-sided chest tube is noted with tip directed poor posterior chest. Large right pleural effusion is noted with high density material layering within it concerning for hemorrhage. Left-sided pacemaker is noted with leads in grossly good position. Sternotomy wires are noted.  CT ABDOMEN AND PELVIS FINDINGS  No focal abnormality seen in the liver, spleen or pancreas on these unenhanced images. Small gallstones are noted in neck of gallbladder. Adrenal glands appear normal. No hydronephrosis or renal  obstruction is noted. No renal or ureteral calculi are noted. Atherosclerotic calcifications of abdominal aorta are noted without aneurysm formation. No bowel obstruction is noted. Sigmoid diverticulosis is noted without inflammation. Urinary bladder is decompressed secondary to Foley catheter. No significant adenopathy is noted.  IMPRESSION: Endotracheal tube in grossly good position. Ventricular assistance device and left-sided pacemaker are noted. Right-sided chest tube has been placed with distal tip directed toward posterior portion of chest. Large right pleural effusion is noted with high density material layering within it concerning for hemorrhage.  Minimal cholelithiasis is noted. Sigmoid diverticulosis is noted without inflammation. No other significant abnormality seen in the abdomen or  pelvis.   Electronically Signed   By: Sabino Dick M.D.   On: 11/06/2013 01:55   Ct Chest Wo Contrast  11/06/2013   CLINICAL DATA:  Status post code, possible pulmonary hemorrhage.  EXAM: CT CHEST, ABDOMEN AND PELVIS WITHOUT CONTRAST  TECHNIQUE: Multidetector CT imaging of the chest, abdomen and pelvis was performed following the standard protocol without IV contrast.  COMPARISON:  None.  FINDINGS: CT CHEST FINDINGS  Endotracheal tube is in grossly good position. Ventricular assist device is again noted. Nasogastric tube is seen passing through the esophagus into proximal stomach. Minimal left pleural effusion is noted with adjacent subsegmental atelectasis. No pneumothorax is seen. Right-sided chest tube is noted with tip directed poor posterior chest. Large right pleural effusion is noted with high density material layering within it concerning for hemorrhage. Left-sided pacemaker is noted with leads in grossly good position. Sternotomy wires are noted.  CT ABDOMEN AND PELVIS FINDINGS  No focal abnormality seen in the liver, spleen or pancreas on these unenhanced images. Small gallstones are noted in neck of  gallbladder. Adrenal glands appear normal. No hydronephrosis or renal obstruction is noted. No renal or ureteral calculi are noted. Atherosclerotic calcifications of abdominal aorta are noted without aneurysm formation. No bowel obstruction is noted. Sigmoid diverticulosis is noted without inflammation. Urinary bladder is decompressed secondary to Foley catheter. No significant adenopathy is noted.  IMPRESSION: Endotracheal tube in grossly good position. Ventricular assistance device and left-sided pacemaker are noted. Right-sided chest tube has been placed with distal tip directed toward posterior portion of chest. Large right pleural effusion is noted with high density material layering within it concerning for hemorrhage.  Minimal cholelithiasis is noted. Sigmoid diverticulosis is noted without inflammation. No other significant abnormality seen in the abdomen or pelvis.   Electronically Signed   By: Sabino Dick M.D.   On: 11/06/2013 01:55   Dg Chest Port 1 View  11/07/2013   CLINICAL DATA:  Left ventricular assist device  EXAM: PORTABLE CHEST - 1 VIEW  COMPARISON:  11/06/2013  FINDINGS: Dense opacity in the mid and right upper lung zone is stable consistent with a loculated effusion. There is vascular congestion and interstitial thickening bilaterally, also stable. Changes from cardiac surgery and left ventricular assist device placement are again noted. No mediastinal widening.  No pneumothorax. Right inferior hemi thorax chest tube, endotracheal tube, right internal jugular central venous line and left anterior chest wall AICD are all stable.  IMPRESSION: No change from the previous day's study allowing for differences in patient positioning and technique. Persistent loculated right mid and upper hemi thorax pleural effusion. Persistent bilateral irregular interstitial thickening.  Support apparatus is stable and well positioned.   Electronically Signed   By: Lajean Manes M.D.   On: 11/07/2013 08:15    Dg Chest Port 1 View  11/06/2013   CLINICAL DATA:  Post right VATS for evacuation of hematoma  EXAM: PORTABLE CHEST - 1 VIEW  COMPARISON:  Portable exam 1521 hr compared to 1023 hr  FINDINGS: Tip of endotracheal tube projects 2.0 cm above carinal.  Nasogastric tube extends into stomach.  Tip of right jugular central venous catheter projects over SVC.  Left ventricular assist device and AICD leads unchanged.  External pacing leads again noted.  Change in right thoracostomy tube.  Persistent opacity at the upper right chest likely pleural-based fluid collection.  Persistent bibasilar opacities.  IMPRESSION: No significant interval change.   Electronically Signed   By: Crist Infante.D.  On: 11/06/2013 15:39   Dg Chest Port 1 View  11/06/2013   CLINICAL DATA:  Ventricular assist device.  EXAM: PORTABLE CHEST - 1 VIEW  COMPARISON:  11/06/2013 at 6:10 a.m.  FINDINGS: Been cyst device stable. The loculated right upper hemi thorax pleural effusion is also stable. No new lung opacities. No pneumothorax.  Endotracheal tube, right chest tube, right subclavian central venous line, nasogastric tube and left anterior chest wall AICD are also stable.  IMPRESSION: 1. No change from the earlier exam. 2. Persistent loculated right upper hemi thorax pleural effusion. 3. Support apparatus is stable in well positioned.   Electronically Signed   By: Lajean Manes M.D.   On: 11/06/2013 12:59   Dg Chest Port 1 View  11/06/2013   CLINICAL DATA:  Hemothorax, followup, past history coronary artery disease, CHF, ischemic cardiomyopathy, MI, arrhythmia, AICD, left ventricular assist device  EXAM: PORTABLE CHEST - 1 VIEW  COMPARISON:  Portable exam 0610 hr compared to 11/06/2013  FINDINGS: Left ventricular assist device again noted.  Tip of endotracheal tube projects 4.0 cm above carina.  Nasogastric tube extends into stomach.  Right thoracostomy tube unchanged.  Right subclavian central venous catheter tip projects over distal SVC.   Left subclavian AICD leads project over right atrium and right ventricle.  External pacing leads present.  Enlargement of cardiac silhouette post median sternotomy.  Persistent loculated pleural effusion by history hemothorax upper right chest.  Increased atelectasis versus infiltrate in right lower lobe.  Minimal left basilar atelectasis.  No gross pneumothorax.  IMPRESSION: Persistent loculated right pleural effusion/hemothorax.  Slightly increased right basilar atelectasis versus infiltrate.  Minimal left basilar atelectasis.   Electronically Signed   By: Lavonia Dana M.D.   On: 11/06/2013 08:10   Dg Chest Port 1 View  11/06/2013   CLINICAL DATA:  Right-sided chest tube placement.  EXAM: PORTABLE CHEST - 1 VIEW  COMPARISON:  November 05, 2013.  FINDINGS: Stable cardiomegaly. Right-sided PICC line is unchanged in position. Left-sided pacemaker is again noted and unchanged. Ventricular assistance device is again noted. Endotracheal tube is seen with distal tip approximately 3 cm above the carina. Nasogastric tube is seen passing through the esophagus and into the stomach. Interval placement of right-sided chest tube. No pneumothorax is seen there remains opacification the right hemi thorax, but pleural effusion appears to be improved.  IMPRESSION: Endotracheal tube is in grossly good position. Interval placement of right-sided chest tube without evidence of pneumothorax; right pleural effusion noted on prior exam appears to be slightly improved with increased aeration of right lower lobe.   Electronically Signed   By: Sabino Dick M.D.   On: 11/06/2013 01:25   Dg Chest Port 1 View  11/05/2013   CLINICAL DATA:  Shortness of breath.  EXAM: PORTABLE CHEST - 1 VIEW  COMPARISON:  November 04, 2013.  FINDINGS: Sternotomy wires are noted. Ventricular assist device is again noted. Left-sided pacemaker is unchanged in position. Right-sided PICC line is unchanged in position. There is now complete opacification of the right  hemothorax due to pneumonia or effusion. No mediastinal shift is seen at this time.  IMPRESSION: Complete opacification of the right hemothorax is now noted concerning for pneumonia, atelectasis or effusion.   Electronically Signed   By: Sabino Dick M.D.   On: 11/05/2013 23:03   Dg Abd Portable 1v  11/07/2013   CLINICAL DATA:  Left ventricular assist device, ileus versus small-bowel obstruction  EXAM: PORTABLE ABDOMEN - 1 VIEW  COMPARISON:  Prior abdominal radiograph 11/04/2013; CT chest/abdomen/pelvis 11/06/2013  FINDINGS: Unremarkable common nonobstructed bowel gas pattern. There is a moderate volume of formed stool in the transverse colon. No large free air on this single supine radiograph. The tip of a right femoral central venous catheter projects over the right SI joint likely in the common femoral vein. A left ventricular assist device is present. Leads from a cardiac rhythm maintenance device are also noted. Bibasilar atelectasis versus infiltrate. Left basilar chest tube. No acute osseous abnormality. A nasogastric tube is noted within the stomach.  IMPRESSION: 1. No evidence of obstruction or ileus. 2. Moderate volume of formed stool in the transverse colon. 3. The nasogastric tube is well positioned in the stomach. 4. Additional support apparatus in satisfactory position as above.   Electronically Signed   By: Jacqulynn Cadet M.D.   On: 11/07/2013 08:15        Assessment:   1. Hemorrhagic shock due to R hemothorax    --s/p R chest tube 3/8 2. 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 CAD 4. Hypothyroidism 5. SBO 6. Acute blood loss anemia 7. PEA arrest 8. Shock liver 9. A/C renal failure, stage III 10. Ventilatory-dependent respiratory failure  Plan/Discussion:    Remains intubated. Hemodynamically stable. Weight up 10 pounds.CVP 5-7. Will eventually need diuresis but holding for now.  Hgb stable. CXR shows persistent RUL opacification. D/w Dr Nelda Marseille and question is consolidation  vs obstruction. May need bronch vs CT. Will d/w Dr. Prescott Gum   VAD parameters look good. Renal function thankfully preserved. No further VT on tele.   The patient is critically ill with multiple organ systems failure and requires high complexity decision making for assessment and support, frequent evaluation and titration of therapies, application of advanced monitoring technologies and extensive interpretation of multiple databases.   Critical Care Time devoted to patient care services described in this note is 35 Minutes.  Length of Stay: 6 Glori Bickers MD 11/07/2013, 8:27 AM  Advanced Heart Failure Team Pager [phone] (M-F; [place])  Please contact [facility] for night-coverage after hours (4p -7a ) and weekends on [email or web address]

## 2013-11-07 NOTE — Addendum Note (Signed)
Addendum created 11/07/13 1137 by Josephine Igo, CRNA   Modules edited: Charges VN

## 2013-11-08 ENCOUNTER — Inpatient Hospital Stay (HOSPITAL_COMMUNITY): Payer: Medicare Other

## 2013-11-08 DIAGNOSIS — R404 Transient alteration of awareness: Secondary | ICD-10-CM

## 2013-11-08 LAB — COMPREHENSIVE METABOLIC PANEL
ALT: 196 U/L — ABNORMAL HIGH (ref 0–53)
AST: 131 U/L — ABNORMAL HIGH (ref 0–37)
Albumin: 2.5 g/dL — ABNORMAL LOW (ref 3.5–5.2)
Alkaline Phosphatase: 104 U/L (ref 39–117)
BUN: 31 mg/dL — ABNORMAL HIGH (ref 6–23)
CO2: 24 mEq/L (ref 19–32)
Calcium: 8.1 mg/dL — ABNORMAL LOW (ref 8.4–10.5)
Chloride: 101 mEq/L (ref 96–112)
Creatinine, Ser: 1.26 mg/dL (ref 0.50–1.35)
GFR calc Af Amer: 65 mL/min — ABNORMAL LOW (ref >90)
GFR calc non Af Amer: 56 mL/min — ABNORMAL LOW (ref >90)
Glucose, Bld: 137 mg/dL — ABNORMAL HIGH (ref 70–99)
Potassium: 3.4 mEq/L — ABNORMAL LOW (ref 3.7–5.3)
Sodium: 137 mEq/L (ref 137–147)
Total Bilirubin: 0.9 mg/dL (ref 0.3–1.2)
Total Protein: 5.7 g/dL — ABNORMAL LOW (ref 6.0–8.3)

## 2013-11-08 LAB — POCT I-STAT 3, ART BLOOD GAS (G3+)
Acid-Base Excess: 2 mmol/L (ref 0.0–2.0)
Bicarbonate: 26.3 mEq/L — ABNORMAL HIGH (ref 20.0–24.0)
O2 Saturation: 98 %
Patient temperature: 99.3
TCO2: 27 mmol/L (ref 0–100)
pCO2 arterial: 39.6 mmHg (ref 35.0–45.0)
pH, Arterial: 7.432 (ref 7.350–7.450)
pO2, Arterial: 101 mmHg — ABNORMAL HIGH (ref 80.0–100.0)

## 2013-11-08 LAB — GLUCOSE, CAPILLARY
Glucose-Capillary: 113 mg/dL — ABNORMAL HIGH (ref 70–99)
Glucose-Capillary: 117 mg/dL — ABNORMAL HIGH (ref 70–99)
Glucose-Capillary: 124 mg/dL — ABNORMAL HIGH (ref 70–99)
Glucose-Capillary: 127 mg/dL — ABNORMAL HIGH (ref 70–99)
Glucose-Capillary: 132 mg/dL — ABNORMAL HIGH (ref 70–99)
Glucose-Capillary: 144 mg/dL — ABNORMAL HIGH (ref 70–99)

## 2013-11-08 LAB — APTT
aPTT: 48 seconds — ABNORMAL HIGH (ref 24–37)
aPTT: 44 seconds — ABNORMAL HIGH (ref 24–37)

## 2013-11-08 LAB — CULTURE, RESPIRATORY W GRAM STAIN

## 2013-11-08 LAB — CBC
HCT: 27.1 % — ABNORMAL LOW (ref 39.0–52.0)
Hemoglobin: 9 g/dL — ABNORMAL LOW (ref 13.0–17.0)
MCH: 29.1 pg (ref 26.0–34.0)
MCHC: 33.2 g/dL (ref 30.0–36.0)
MCV: 87.7 fL (ref 78.0–100.0)
Platelets: 112 10*3/uL — ABNORMAL LOW (ref 150–400)
RBC: 3.09 MIL/uL — ABNORMAL LOW (ref 4.22–5.81)
RDW: 17.2 % — ABNORMAL HIGH (ref 11.5–15.5)
WBC: 10.2 10*3/uL (ref 4.0–10.5)

## 2013-11-08 LAB — CARBOXYHEMOGLOBIN
Carboxyhemoglobin: 1.8 % — ABNORMAL HIGH (ref 0.5–1.5)
Methemoglobin: 1.5 % (ref 0.0–1.5)
O2 Saturation: 61.8 %
Total hemoglobin: 9.5 g/dL — ABNORMAL LOW (ref 13.5–18.0)

## 2013-11-08 LAB — CULTURE, RESPIRATORY: Special Requests: NORMAL

## 2013-11-08 LAB — PROTIME-INR
INR: 1.64 — ABNORMAL HIGH (ref 0.00–1.49)
Prothrombin Time: 19 seconds — ABNORMAL HIGH (ref 11.6–15.2)

## 2013-11-08 LAB — MAGNESIUM: Magnesium: 2 mg/dL (ref 1.5–2.5)

## 2013-11-08 LAB — BASIC METABOLIC PANEL
BUN: 27 mg/dL — ABNORMAL HIGH (ref 6–23)
CO2: 26 mEq/L (ref 19–32)
Calcium: 8 mg/dL — ABNORMAL LOW (ref 8.4–10.5)
Chloride: 103 mEq/L (ref 96–112)
Creatinine, Ser: 1.25 mg/dL (ref 0.50–1.35)
GFR calc Af Amer: 66 mL/min — ABNORMAL LOW (ref >90)
GFR calc non Af Amer: 57 mL/min — ABNORMAL LOW (ref >90)
Glucose, Bld: 126 mg/dL — ABNORMAL HIGH (ref 70–99)
Potassium: 3.5 mEq/L — ABNORMAL LOW (ref 3.7–5.3)
Sodium: 140 mEq/L (ref 137–147)

## 2013-11-08 LAB — HEPARIN LEVEL (UNFRACTIONATED): Heparin Unfractionated: <0.1 IU/mL — ABNORMAL LOW (ref 0.30–0.70)

## 2013-11-08 LAB — PREALBUMIN: Prealbumin: 8.7 mg/dL — ABNORMAL LOW (ref 17.0–34.0)

## 2013-11-08 LAB — PHOSPHORUS: Phosphorus: 3.3 mg/dL (ref 2.3–4.6)

## 2013-11-08 LAB — TRIGLYCERIDES: Triglycerides: 112 mg/dL (ref <150)

## 2013-11-08 MED ORDER — FAT EMULSION 20 % IV EMUL
250.0000 mL | INTRAVENOUS | Status: AC
  Administered 2013-11-08: 250 mL via INTRAVENOUS
  Filled 2013-11-08: qty 250

## 2013-11-08 MED ORDER — POTASSIUM CHLORIDE 10 MEQ/50ML IV SOLN
10.0000 meq | INTRAVENOUS | Status: AC
  Administered 2013-11-08 (×4): 10 meq via INTRAVENOUS
  Filled 2013-11-08 (×4): qty 50

## 2013-11-08 MED ORDER — FUROSEMIDE 10 MG/ML IJ SOLN
5.0000 mg/h | INTRAVENOUS | Status: DC
  Administered 2013-11-08: 5 mg/h via INTRAVENOUS
  Filled 2013-11-08: qty 25

## 2013-11-08 MED ORDER — SODIUM CHLORIDE 0.9 % IV BOLUS (SEPSIS)
250.0000 mL | Freq: Once | INTRAVENOUS | Status: AC
  Administered 2013-11-08: 250 mL via INTRAVENOUS

## 2013-11-08 MED ORDER — TRACE MINERALS CR-CU-F-FE-I-MN-MO-SE-ZN IV SOLN
INTRAVENOUS | Status: AC
  Administered 2013-11-08: 18:00:00 via INTRAVENOUS
  Filled 2013-11-08: qty 2000

## 2013-11-08 MED ORDER — SORBITOL 70 % SOLN
960.0000 mL | TOPICAL_OIL | Freq: Once | ORAL | Status: AC
  Administered 2013-11-09: 480 mL via RECTAL
  Filled 2013-11-08: qty 240

## 2013-11-08 MED ORDER — SORBITOL 70 % SOLN
30.0000 mL | Freq: Once | Status: AC
  Administered 2013-11-08: 30 mL via ORAL
  Filled 2013-11-08: qty 30

## 2013-11-08 MED ORDER — POTASSIUM CHLORIDE 10 MEQ/50ML IV SOLN
10.0000 meq | INTRAVENOUS | Status: AC
  Administered 2013-11-08 (×3): 10 meq via INTRAVENOUS

## 2013-11-08 NOTE — Progress Notes (Signed)
RT note: assisted RN with chest tube removal, inspiratory hold performed. Pt tolerated well.

## 2013-11-08 NOTE — Progress Notes (Signed)
Dr Haroldine Laws called to make aware of maps dropping into the 50s, progressively going up on Levo Drip. Md ordered to turn lasix drip off and to give 250 NS bolus. Will cont to monitor and assess. Also no BM today after sorbitol given down the tube. MD instructed wait until patient is hemodynamically stable and then given smog enema.

## 2013-11-08 NOTE — Progress Notes (Signed)
Patient became tachypnic and spo2 dropped to 88. Back on full support. RN notified, RN at bedside, RT will monitor.

## 2013-11-08 NOTE — Progress Notes (Signed)
Advanced Heart Failure Team Rounding Note     Subjective:    Johnny Oneill is a 73 year old patient with a history of CAD s/p stent to LCX 2004 with re-occlusion of LCX with lateral MI 2009 and repeat stenting, total occlusion of RCA, ICM, chronic systolic heart failure, hypothyroidism, HLD and arthritis. 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   Admitted to [facility] 09/13/13 through 10/03/13. Initially admitted with decompenstaed heart failure and low output. Required IABP for stabilization. He ultimately had LVAD HM II placed on 09/19/13. He suffered from RV failure after the procedure and required milrinone for >7 days which was gradually weaned to off. During his stay he did develop acute ICU psychosis with delirium which was treated with Zyprexa. Once he was transferred out of the ICU this improved and he returned back to baseline. He was not discharged on beta blocker. D/C weight 151 pounds.   Admitted on 3/4 with SBO/ileus. Was improving then had cardiac arrest last night (3/8) with PEA in setting of R hemothorax. Now s/p R chest tube and multiple units transfusion.   Underwent R VATs 3/9 then bronch on 3/10. Remains intubated. On levophed 5.  Vasopressin off. Hgb stable at 9.0. Good urine output. Cr 1.2. LFTs trending down. CXR improved. Still with ileus. On reglan. MAPs 70-80s CVP ~18. Co-ox 62%.  CCM started lasix gtt.   LVAD INTERROGATION:  HeartMate II LVAD: Flow 5.4 liters/min, speed 9400, power 5.8, PI 4.9  2 PI events  I reviewed the LVAD parameters from today, and compared the results to the patient's prior recorded data. No programming changes were made. The LVAD is functioning within specified parameters. The patient performs LVAD self-test daily. LVAD interrogation was negative for any significant power changes, alarms or PI events/speed drops. LVAD equipment check completed and is in good working order. Back-up equipment present. LVAD education done on emergency procedures and  precautions and reviewed exit site care.   Physical Exam: GENERAL: intubated. sedated HEENT: normal  +ET & NGT NECK: Supple CARDIAC: Mechanical heart sounds with LVAD hum present.  LUNGS: R chest tube.. Decreased BS on R.  ABDOMEN:+ distended, nontender, hypoactive bowel sounds LVAD exit site: well-healed and incorporated. Dressing dry and intact. No erythema or drainage. Stabilization device present and accurately applied. Driveline dressing is being changed daily per sterile technique.  EXTREMITIES: Warm and dry, no cyanosis, clubbing, rash or edema  NEUROLOGIC: Alert and oriented x 4. Gait steady. No aphasia. No dysarthria. Affect pleasant.    Telemetry: SR   Labs: Basic Metabolic Panel:  Recent Labs Lab 11/02/13 0355 11/02/13 0500 11/03/13 0742  11/06/13 0500 11/06/13 0855 11/06/13 1500 11/07/13 0400 11/08/13 0415  NA 137 137 135*  < > 135* 137 134* 138 137  K 4.3 4.1 4.1  < > 3.9 4.0 4.5 4.1 3.4*  CL 102 101 100  < > 97 100 99 102 101  CO2 22 23 25   < > 21 23 23 21 24   GLUCOSE 117* 96 119*  < > 169* 144* 118* 97 137*  BUN 14 15 20   < > 31* 32* 33* 33* 31*  CREATININE 1.12 1.21 1.06  < > 1.51* 1.46* 1.44* 1.22 1.26  CALCIUM 8.8 8.6 8.7  < > 8.4 8.7 8.5 8.4 8.1*  MG 2.1 2.0 2.1  --   --   --   --  1.7 2.0  PHOS 3.5 4.0 3.8  --   --   --   --  3.4  3.3  < > = values in this interval not displayed.  Liver Function Tests:  Recent Labs Lab 11/06/13 0132 11/06/13 0500 11/06/13 0855 11/07/13 0400 11/08/13 0415  AST 538* 570* 490* 259* 131*  ALT 293* 332* 312* 233* 196*  ALKPHOS 102 95 89 81 104  BILITOT 1.3* 1.4* 1.1 1.4* 0.9  PROT 6.3 5.8* 5.8* 5.6* 5.7*  ALBUMIN 2.6* 2.6* 2.6* 2.8* 2.5*    Recent Labs Lab 11/01/13 1812  LIPASE 39   No results found for this basename: AMMONIA,  in the last 168 hours  CBC:  Recent Labs Lab 11/01/13 1812  11/03/13 0742  11/06/13 2000 11/06/13 2300 11/07/13 0400 11/07/13 1600 11/08/13 0415  WBC 10.7*  < > 8.7  < >  8.8 8.6 7.2 11.1* 10.2  NEUTROABS 8.8*  --  6.5  --   --   --   --   --   --   HGB 12.5*  < > 10.8*  < > 9.2* 9.2* 9.1* 9.4* 9.0*  HCT 38.7*  < > 34.4*  < > 26.1* 25.8* 25.6* 27.0* 27.1*  MCV 85.6  < > 85.8  < > 84.5 84.0 84.8 86.3 87.7  PLT 234  < > 248  < > 88* 83* 80* 100* 112*  < > = values in this interval not displayed.  Cardiac Enzymes:  Recent Labs Lab 11/01/13 1812  TROPONINI <0.30    BNP: BNP (last 3 results)  Recent Labs  09/13/13 1349 09/25/13 0400 11/01/13 1033  PROBNP 3883.0* 7019.0* 877.6*    CBG:  Recent Labs Lab 11/07/13 1555 11/07/13 1926 11/07/13 2322 11/08/13 0329 11/08/13 0801  GLUCAP 106* 109* 117* 117* 127*    Coagulation Studies:  Recent Labs  11/06/13 0132 11/06/13 0500 11/06/13 0855 11/07/13 0400 11/08/13 0415  LABPROT 18.3* 18.5* 18.8* 24.1* 19.0*  INR 1.57* 1.59* 1.62* 2.24* 1.64*    Other results:   Imaging: Dg Chest Port 1 View  11/08/2013   CLINICAL DATA Endotracheal tube position.  EXAM PORTABLE CHEST - 1 VIEW  COMPARISON 11/07/2013  FINDINGS Endotracheal tube remains in place with tip approximately 2.5 cm above the carina. Right jugular central venous catheter is unchanged with tip overlying the mid to lower SVC. Left-sided pacemaker remains in place. Left ventricular assist device is partially visualized. Right-sided chest tube remains in place. Enteric tube courses into the left upper abdomen.  Dense opacity in the mid and upper right hemithorax all is stable to slightly improved. Interstitial opacities bilaterally have improved. No pneumothorax is identified. Cardiac silhouette remains enlarged.  IMPRESSION 1. Unchanged appearance of support lines and tubes. 2. Stable to slightly improved loculated right pleural effusion. 3. Improved bilateral interstitial lung opacities.  SIGNATURE  Electronically Signed   By: Logan Bores   On: 11/08/2013 07:35   Dg Chest Port 1 View  11/07/2013   CLINICAL DATA:  Left ventricular assist  device  EXAM: PORTABLE CHEST - 1 VIEW  COMPARISON:  11/06/2013  FINDINGS: Dense opacity in the mid and right upper lung zone is stable consistent with a loculated effusion. There is vascular congestion and interstitial thickening bilaterally, also stable. Changes from cardiac surgery and left ventricular assist device placement are again noted. No mediastinal widening.  No pneumothorax. Right inferior hemi thorax chest tube, endotracheal tube, right internal jugular central venous line and left anterior chest wall AICD are all stable.  IMPRESSION: No change from the previous day's study allowing for differences in patient positioning and technique. Persistent  loculated right mid and upper hemi thorax pleural effusion. Persistent bilateral irregular interstitial thickening.  Support apparatus is stable and well positioned.   Electronically Signed   By: Lajean Manes M.D.   On: 11/07/2013 08:15   Dg Chest Port 1 View  11/06/2013   CLINICAL DATA:  Post right VATS for evacuation of hematoma  EXAM: PORTABLE CHEST - 1 VIEW  COMPARISON:  Portable exam 1521 hr compared to 1023 hr  FINDINGS: Tip of endotracheal tube projects 2.0 cm above carinal.  Nasogastric tube extends into stomach.  Tip of right jugular central venous catheter projects over SVC.  Left ventricular assist device and AICD leads unchanged.  External pacing leads again noted.  Change in right thoracostomy tube.  Persistent opacity at the upper right chest likely pleural-based fluid collection.  Persistent bibasilar opacities.  IMPRESSION: No significant interval change.   Electronically Signed   By: Lavonia Dana M.D.   On: 11/06/2013 15:39   Dg Chest Port 1 View  11/06/2013   CLINICAL DATA:  Ventricular assist device.  EXAM: PORTABLE CHEST - 1 VIEW  COMPARISON:  11/06/2013 at 6:10 a.m.  FINDINGS: Been cyst device stable. The loculated right upper hemi thorax pleural effusion is also stable. No new lung opacities. No pneumothorax.  Endotracheal tube, right  chest tube, right subclavian central venous line, nasogastric tube and left anterior chest wall AICD are also stable.  IMPRESSION: 1. No change from the earlier exam. 2. Persistent loculated right upper hemi thorax pleural effusion. 3. Support apparatus is stable in well positioned.   Electronically Signed   By: Lajean Manes M.D.   On: 11/06/2013 12:59   Dg Abd Portable 1v  11/08/2013   CLINICAL DATA Ileus  EXAM PORTABLE ABDOMEN - 1 VIEW  COMPARISON DG ABD PORTABLE 1V dated 11/07/2013; DG ABD PORTABLE 1V dated 11/04/2013; CT ABD/PELV WO CM dated 11/06/2013  FINDINGS Enteric contrast remains primarily within the ascending and transverse colon. There is no significant gaseous distention of either the large or small bowel.  No supine evidence of pneumoperitoneum. No definite pneumatosis or portal venous gas. Enteric tube tip and side port overlie the expected location of the gastric antrum.  Limited visualization of the lower thorax demonstrates the sequela of LVAD placement and median sternotomy. Note is made of a basal lead directed left-sided chest tube. Right femoral approach central venous catheter overlies the right sacral ale a.  No acute osseus abnormalities.  IMPRESSION Enteric contrast remains within the colon suggestive of mild ileus. No dilatation of the upstream small bowel to suggest obstruction.  SIGNATURE  Electronically Signed   By: Sandi Mariscal M.D.   On: 11/08/2013 08:14   Dg Abd Portable 1v  11/07/2013   CLINICAL DATA:  Left ventricular assist device, ileus versus small-bowel obstruction  EXAM: PORTABLE ABDOMEN - 1 VIEW  COMPARISON:  Prior abdominal radiograph 11/04/2013; CT chest/abdomen/pelvis 11/06/2013  FINDINGS: Unremarkable common nonobstructed bowel gas pattern. There is a moderate volume of formed stool in the transverse colon. No large free air on this single supine radiograph. The tip of a right femoral central venous catheter projects over the right SI joint likely in the common femoral  vein. A left ventricular assist device is present. Leads from a cardiac rhythm maintenance device are also noted. Bibasilar atelectasis versus infiltrate. Left basilar chest tube. No acute osseous abnormality. A nasogastric tube is noted within the stomach.  IMPRESSION: 1. No evidence of obstruction or ileus. 2. Moderate volume of formed stool in  the transverse colon. 3. The nasogastric tube is well positioned in the stomach. 4. Additional support apparatus in satisfactory position as above.   Electronically Signed   By: Jacqulynn Cadet M.D.   On: 11/07/2013 08:15        Assessment:   1. Hemorrhagic shock due to R hemothorax    --s/p R chest tube 3/8 2. 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 CAD 4. Hypothyroidism 5. SBO 6. Acute blood loss anemia 7. PEA arrest 8. Shock liver 9. A/C renal failure, stage III 10. Ventilatory-dependent respiratory failure 11. Hypokalemia  Plan/Discussion:    Remains intubated. Hemodynamically stable. Weight up 10 pounds. CVP 18.  Wean levophed. Agree with lasix gtt. Supp K+  Hgb stable. CXR improved. Hopefully can extubate soon.  Continues with ileus. Will try sorbitol down tube prior to enema. Continue reglan and NGT suction.  VAD parameters look good.  The patient is critically ill with multiple organ systems failure and requires high complexity decision making for assessment and support, frequent evaluation and titration of therapies, application of advanced monitoring technologies and extensive interpretation of multiple databases.   Critical Care Time devoted to patient care services described in this note is 35 Minutes.  Length of Stay: 7 Glori Bickers MD 11/08/2013, 9:38 AM  Advanced Heart Failure Team Pager [phone] (M-F; [place])  Please contact [facility] for night-coverage after hours (4p -7a ) and weekends on [email or web address]

## 2013-11-08 NOTE — Op Note (Signed)
NAME:  Johnny Oneill, Johnny Oneill NO.:  [id number]  MEDICAL RECORD NO.:  [id number]  LOCATION:                                 FACILITY:  PHYSICIAN:  Ivin Poot, M.D.  DATE OF BIRTH:  05-Jun-1941  DATE OF PROCEDURE:  11/07/2013 DATE OF DISCHARGE:                              OPERATIVE REPORT   OPERATION:  Flexible bronchoscopy.  SURGEON:  Ivin Poot, M.D.  PREOPERATIVE DIAGNOSIS:  Consolidation of right upper lobe.  POSTOPERATIVE DIAGNOSIS:  Consolidation of right upper lobe.  ANESTHESIA:  IV sedation.  INDICATION:  The patient is a 74 year old gentleman who 2 months ago, underwent implantation of a permanent left ventricular assist device for nonischemic cardiomyopathy.  He recently was readmitted to the hospital with partial small-bowel obstruction which had resolved.  He then developed a right hemothorax, which required emergency chest tube placement and then subsequent right VATS.  After the VATS evacuation of hematoma, he still had consolidation of the right upper lobe, and it was felt that fiberoptic bronchoscopy to rule out obstruction or mucus plugging would potentially help his pulmonary status, as the patient is currently intubated.  Informed consent was obtained from the patient's Wife.A proper time-out was performed before beginning.  OPERATIVE PROCEDURE:  Intravenous fentanyl and Versed were administered to the patient.  He remained on the ventilator.  Through the endotracheal tube, a fiberoptic bronchoscope was inserted.  The distal trachea had some scant mucoid bloody secretions which were cleared and irrigated.  The left mainstem bronchus had no significant disease or secretions.  The left upper lobe and left lower lobe segments were clear of airway secretions.  The bronchoscope was withdrawn back and passed to the right mainstem bronchus.  The right upper lobe and right middle lobes had some bloody mucoid material which was cleared easily.   There were no clots.  This material was sent for culture.  The lower right lower lobe bronchus was also irrigated and cleared of some scant secretions which were minimally bloody.  The main bronchial airways were open and the bronchoscope was removed.  The patient tolerated the procedure well.     Ivin Poot, M.D.     PV/MEDQ  D:  11/07/2013  T:  11/08/2013  Job:  [id number]

## 2013-11-08 NOTE — Progress Notes (Signed)
RT Note- patient weaned for almost one hour, increased RR, with low sp02, placed back to full support.

## 2013-11-08 NOTE — Progress Notes (Signed)
PULMONARY  / CRITICAL CARE MEDICINE  Name: Johnny Oneill MRN: [id number] DOB: 08-13-1941    ADMISSION DATE:  11/01/2013 CONSULTATION DATE:  3/8  PRIMARY SERVICE: Cardiolgoy  CHIEF COMPLAINT:  Cardiogenic Shock  BRIEF PATIENT DESCRIPTION: 70 male with LVAD implanted 09/19/2013 with onset of shock, likely due to hemorrhage, necessitating intubation  SIGNIFICANT EVENTS / STUDIES:  3/8 CXR with with complete opacification of right hemothorax 3/9 improvement of right hemothorax s/p chest tube.  ET tube in place 3/9 CT Chest abd pelvis Endotracheal tube in grossly good position. Ventricular assistance device and left-sided pacemaker are noted. Right-sided chest tube has been placed with distal tip directed toward posterior portion of chest. Large right pleural effusion is noted with high density material layering within it concerning for hemorrhage  LINES / TUBES: [facility] CVL 3/5 Right Femoral CVL 3/5 Right Femoral A line 3/5  CULTURES: BAL 3/10>>>NTD Sputum 3/9>>>NTD Blood 3/8>>>NTD  ANTIBIOTICS: Zosyn 3/5>>> Vancomcyin 3/5>>>  SUBJECTIVE: Sedated and intubated, no events overnight.  Pressor demand decreasing.  VITAL SIGNS: Temp:  [99 F (37.2 C)-99.7 F (37.6 C)] 99 F (37.2 C) (03/11 0802) Pulse Rate:  [25-91] 64 (03/11 0700) Resp:  [14-25] 16 (03/11 0700) BP: (80-90)/(65-77) 90/77 mmHg (03/11 0331) SpO2:  [94 %-100 %] 100 % (03/11 0700) Arterial Line BP: (61-121)/(48-100) 61/48 mmHg (03/11 0700) FiO2 (%):  [50 %-100 %] 50 % (03/11 0726) Weight:  [72.9 kg (160 lb 11.5 oz)] 72.9 kg (160 lb 11.5 oz) (03/11 0500)  HEMODYNAMICS: CVP:  [5 mmHg-37 mmHg] 18 mmHg  VENTILATOR SETTINGS: Vent Mode:  [-] PRVC FiO2 (%):  [50 %-100 %] 50 % Set Rate:  [16 bmp] 16 bmp Vt Set:  [500 mL] 500 mL PEEP:  [10 cmH20] 10 cmH20 Plateau Pressure:  [27 cmH20-31 cmH20] 27 cmH20  INTAKE / OUTPUT: Intake/Output     03/10 0701 - 03/11 0700 03/11 0701 - 03/12 0700   I.V. (mL/kg) 514.1 (7.1)     Blood     NG/GT 200    IV Piggyback 437.5    TPN 5022    Total Intake(mL/kg) 6173.5 (84.7)    Urine (mL/kg/hr) 2535 (1.4)    Emesis/NG output 150 (0.1)    Blood     Chest Tube 140 (0.1)    Total Output 2825     Net +3348.5           PHYSICAL EXAMINATION: General: Chronically ill appearing male, sedated. Neuro: Sedated and intubated, moves all ext to pain. HEENT: [place]/AT, PERRL, EOMI, pale conjunctiva. Cardiovascular: RRR, Nl S1/S2, -M/R/G. Lungs: Coarse BS diffusely. Abdomen: Soft, NT, ND and +BS. Musculoskeletal:  No lower extremity edema, unable to palpate pulses Skin:  Pale, no ecchymosis, no evidence of retropertoneal bleed  LABS:  Recent Labs Lab 11/01/13 1033 11/01/13 1812 11/01/13 1838  11/03/13 0742  11/05/13 2122  11/06/13 0156  11/06/13 0500  11/06/13 0535  11/06/13 0855  11/06/13 1500  11/07/13 0400 11/07/13 0411 11/07/13 1138 11/07/13 1600 11/08/13 0415 11/08/13 0419  HGB 12.3* 12.5*  --   < > 10.8*  < >  --   < >  --   --  11.4*  --   --   < >  --   < > 10.2*  < > 9.1*  --   --  9.4* 9.0*  --   WBC 7.3 10.7*  --   < > 8.7  < >  --   < >  --   --  17.5*  --   --   < >  --   < >  11.3*  < > 7.2  --   --  11.1* 10.2  --   PLT 251 234  --   < > 248  < >  --   < >  --   --  131*  --   --   < >  --   < > 115*  < > 80*  --   --  100* 112*  --   NA 139 139  --   < > 135*  < >  --   < >  --   --  135*  --   --   --  137  --  134*  --  138  --   --   --  137  --   K 4.4 4.3  --   < > 4.1  < >  --   < >  --   --  3.9  --   --   --  4.0  --  4.5  --  4.1  --   --   --  3.4*  --   CL 102 102  --   < > 100  < >  --   < >  --   --  97  --   --   --  100  --  99  --  102  --   --   --  101  --   CO2 24 23  --   < > 25  < >  --   < >  --   --  21  --   --   --  23  --  23  --  21  --   --   --  24  --   GLUCOSE 99 116*  --   < > 119*  < >  --   < >  --   --  169*  --   --   --  144*  --  118*  --  97  --   --   --  137*  --   BUN 16 17  --   < > 20  < >  --   < >  --   --   31*  --   --   --  32*  --  33*  --  33*  --   --   --  31*  --   CREATININE 1.37* 1.37*  --   < > 1.06  < >  --   < >  --   --  1.51*  --   --   --  1.46*  --  1.44*  --  1.22  --   --   --  1.26  --   CALCIUM 8.6 8.8  --   < > 8.7  < >  --   < >  --   --  8.4  --   --   --  8.7  --  8.5  --  8.4  --   --   --  8.1*  --   MG  --   --   --   < > 2.1  --   --   --   --   --   --   --   --   --   --   --   --   --  1.7  --   --   --  2.0  --   PHOS  --   --   --   < > 3.8  --   --   --   --   --   --   --   --   --   --   --   --   --  3.4  --   --   --  3.3  --   AST 36 36  --   < > 35  < >  --   < >  --   --  570*  --   --   --  490*  --   --   --  259*  --   --   --  131*  --   ALT 27 28  --   < > 24  < >  --   < >  --   --  332*  --   --   --  312*  --   --   --  233*  --   --   --  196*  --   ALKPHOS 134* 134*  --   < > 102  < >  --   < >  --   --  95  --   --   --  89  --   --   --  81  --   --   --  104  --   BILITOT 0.5 0.6  --   < > 0.4  < >  --   < >  --   --  1.4*  --   --   --  1.1  --   --   --  1.4*  --   --   --  0.9  --   PROT 7.7 7.6  --   < > 7.0  < >  --   < >  --   --  5.8*  --   --   --  5.8*  --   --   --  5.6*  --   --   --  5.7*  --   ALBUMIN 3.1* 3.3*  --   < > 2.8*  < >  --   < >  --   --  2.6*  --   --   --  2.6*  --   --   --  2.8*  --   --   --  2.5*  --   APTT  --   --   --   --   --   --   --   --   --   --   --   --   --   < > 31  --   --   --  53*  --   --  59* 48*  --   INR 3.74* 3.56*  --   < > 2.09*  < >  --   < >  --   --  1.59*  --   --   --  1.62*  --   --   --  2.24*  --   --   --  1.64*  --   LATICACIDVEN  --   --  1.31  --   --   --  3.2*  --  5.7*  --   --   --   --   --   --   --   --   --   --   --   --   --   --   --  TROPONINI  --  <0.30  --   --   --   --   --   --   --   --   --   --   --   --   --   --   --   --   --   --   --   --   --   --   PROCALCITON  --   --   --   --   --   --   --   --   --   --   --   --  2.81  --   --   --   --   --   --   --    --   --   --   --   PROBNP 877.6*  --   --   --   --   --   --   --   --   --   --   --   --   --   --   --   --   --   --   --   --   --   --   --   PHART  --   --   --   --   --   --   --   < >  --   < >  --   < >  --   < >  --   < >  --   < >  --  7.487* 7.361  --   --  7.432  PCO2ART  --   --   --   --   --   --   --   < >  --   < >  --   < >  --   < >  --   < >  --   < >  --  29.8* 42.6  --   --  39.6  PO2ART  --   --   --   --   --   --   --   < >  --   < >  --   < >  --   < >  --   < >  --   < >  --  70.7* 65.0*  --   --  101.0*  < > = values in this interval not displayed.  Recent Labs Lab 11/07/13 1555 11/07/13 1926 11/07/13 2322 11/08/13 0329 11/08/13 0801  GLUCAP 106* 109* 117* 117* 127*    CXR: Patient with right sided chest tube in place, ET tube in place, cardiomegaly, right subclavian line in place, noted right hemi opacity (improvment)  CT chest Complicated effusion, multiple denisties with layering consistent with blood in right thorax.  No gas pockets in fluid collection  ASSESSMENT / PLAN:  PULMONARY A:  Respiratory Failure Due to Cardiogenic/Hemorrhagic shock  P:   - Acidosis resolved - D/Ced bicarb drip and decreased RR to 16. - Decrease PEEP to 5 and FiO2 to 40% after bronch. - CXR and ABG in AM. - Continue full vent support. - Avoid dyssynchrony.  CARDIOVASCULAR A: Cardiogenic/Hemorrhagic Shock LVAD in place functioning appropriately with good flow Norepinephrine requirements trending downward with vasopressin supplementation (does not constrict pulmonary vasculature in a patient with known RVHF) Off levo overnight. P:  - LVAD in place. - Levo 5 mcg. -  Diureses as ordered.  RENAL A:  AKI Acute renal failure Pre-Renal from hemorrhagic shock/Cardiogenic Shock.  Improving.  P: - Renal dose medications. - D/Ced Bicarb drip. - Maintain hemodynamics as able to preserve renal function. - Lasix 5 mg/hr drip x24 hours. - Replace  K.  GASTROINTESTINAL A:  Stress Ulcer Prophylaxis.  Ileus. P:   - Protonix in place - TPN for ileus.  HEMATOLOGIC A:  Hemorrhagic shock Transfusion with FFP, partial reversal of anticoagulation and holding coumadin (risk of bleeding vs LVAD circuit clotting) P:  - Daily CBC.  INFECTIOUS A:  Possible sepsis Multiple sites of possible infection with CVL, LVAD, Urine, Lung with persistent opacity (although likely just evolving bleed) P:   - Zosyn/Vanc as ordered. - Bronch done and cultures sent, NTD.  ENDOCRINE A:  Glucose control   P:   - SSI in place  NEUROLOGIC A:  Hx of Delerium P:   - Versed and fentanyl drips as ordered.  I have personally obtained a history, examined the patient, evaluated laboratory and imaging results, formulated the assessment and plan and placed orders.  CRITICAL CARE: The patient is critically ill with multiple organ systems failure and requires high complexity decision making for assessment and support, frequent evaluation and titration of therapies, application of advanced monitoring technologies and extensive interpretation of multiple databases. Critical Care Time devoted to patient care services described in this note is 35 minutes.   Rush Farmer, M.D. [facility] Pulmonary/Critical Care Medicine. Pager: [phone]. After hours pager: [phone].

## 2013-11-08 NOTE — Progress Notes (Signed)
PARENTERAL NUTRITION CONSULT NOTE: F/U  Pharmacy Consult:  TPN Indication: Ileus  Allergies  Allergen Reactions  . Diovan [Valsartan] Other (See Comments)    Hypotension at low dose, hyperkalemia  . Lipitor [Atorvastatin Calcium] Other (See Comments)    Muscle pain    Patient Measurements: Height: 5\' 9"  (175.3 cm) Weight: 160 lb 11.5 oz (72.9 kg) IBW/kg (Calculated) : 70.7 Usual Weight: 68 kg  Vital Signs: Temp: 99 F (37.2 C) (03/11 0802) Temp src: Core (Comment) (03/11 0802) BP: 90/77 mmHg (03/11 0331) Pulse Rate: 72 (03/11 0900) Intake/Output from previous day: 03/10 0701 - 03/11 0700 In: 6173.5 [I.V.:514.1; NG/GT:200; IV Piggyback:437.5; TPN:5022] Out: 2825 [Urine:2535; Emesis/NG output:150; Chest Tube:140]  Labs:  Recent Labs  11/06/13 0855  11/07/13 0400 11/07/13 1600 11/08/13 0415  WBC  --   < > 7.2 11.1* 10.2  HGB  --   < > 9.1* 9.4* 9.0*  HCT  --   < > 25.6* 27.0* 27.1*  PLT  --   < > 80* 100* 112*  APTT 31  --  53* 59* 48*  INR 1.62*  --  2.24*  --  1.64*  < > = values in this interval not displayed.   Recent Labs  11/06/13 0855 11/06/13 1500 11/07/13 0400 11/07/13 1110 11/08/13 0415  NA 137 134* 138  --  137  K 4.0 4.5 4.1  --  3.4*  CL 100 99 102  --  101  CO2 23 23 21   --  24  GLUCOSE 144* 118* 97  --  137*  BUN 32* 33* 33*  --  31*  CREATININE 1.46* 1.44* 1.22  --  1.26  CALCIUM 8.7 8.5 8.4  --  8.1*  MG  --   --  1.7  --  2.0  PHOS  --   --  3.4  --  3.3  PROT 5.8*  --  5.6*  --  5.7*  ALBUMIN 2.6*  --  2.8*  --  2.5*  AST 490*  --  259*  --  131*  ALT 312*  --  233*  --  196*  ALKPHOS 89  --  81  --  104  BILITOT 1.1  --  1.4*  --  0.9  PREALBUMIN  --   --   --  10.4*  --   TRIG  --   --   --  117 112   Estimated Creatinine Clearance: 54.6 ml/min (by C-G formula based on Cr of 1.26).    Recent Labs  11/07/13 2322 11/08/13 0329 11/08/13 0801  GLUCAP 117* 117* 127*     Insulin Requirements in the past 24 hours:   unit  sensitive SSI/24h with CBGs 100-127/24h  Assessment: 73 YOM with history of recent LVAD and discharged on 09/19/13.  Patient returned with N/V and CT scan revealed SBO.   GI:  SBO believed to have resolved. Had flatus and several BMs. TPN d/c'd 3/7. Resumed TPN 3/10 for inability to take po's/TFs and high NG output . Emesis/NG=150 down. No recent BM noted since 3/8. No BS. Probably ileus Prealbumin 10.4. SMOG enema ordered.  Endo: hypothyroid on Synthroid (dose increased).  No hx DM - CBGs controlled on SSI  Lytes: K 3.4. Ca 8.1 but 9.3 adjusted. Ca/Phos product: 31 ok. Kruns x 4 ordered by MD.  Renal: Scr 1.26. UOP 1.4  Cards: CAD s/p stent to LCX 2004 with re-occlusion of LCX and lateral MI 2009 and repeat stenting, total occlusion of  RCA, ICM, chronic sHF, s/p LVAD 09/19/13 -Cardiac arrest 3/8 with PEA with R hemothorax. Cardiogenic/hemorrhagic shock. 3/9: right VATS and evacuation of hematoma-hemothorax. Levophed. Now on Lasix 5mg /hr x 24h.  Hepatobil: Shock liver s/p PEA arrest 3/8. Tbili 0.9with elevated LFTs improving. INR 2.24.  Heme: Hgb 9. Plts only 80>>112 today.  Resp: VDRF. Has R-sided chest tube and large R pleural effusion concerning for hemorrhage (on 3/9 CT). Fent/Versed drips. FiO100%. Chest tube with 275>>240 output.  ID: Vanco/Zosyn #7. WBc 10.2. Tc=99  Anticoag: LVAD. Coumadin PTA with INR 3.5 upon admit. Reversed. Heparin drip for LVAD low dose at 30ml/hr per MD. INR at 2.24>>1.64 due to shock liver.  Best Practices: PPI IV, MC,  TPN Access: CVC placed 11/02/13  TPN day#: 3/5-3/8, resume 3/10  Current Nutrition:  TPN at goal 69ml/hr = 1894kcal and 99.6g protein  Nutritional Goals:  1750-1850 kcal and 105-115g protein per RD note 3/10.   Plan:  - Increase Clinimix E to 31ml/hr. - Daily IV multivitamin in TPN - Trace elements on M/W/F due to [employer] S. Alford Highland, PharmD, [facility] Clinical Staff Pharmacist Pager [phone]  11/08/2013, 9:28  AM

## 2013-11-09 ENCOUNTER — Inpatient Hospital Stay (HOSPITAL_COMMUNITY): Payer: Medicare Other

## 2013-11-09 LAB — TYPE AND SCREEN
ABO/RH(D): O NEG
Antibody Screen: NEGATIVE
Unit division: 0
Unit division: 0
Unit division: 0
Unit division: 0
Unit division: 0
Unit division: 0
Unit division: 0
Unit division: 0
Unit division: 0
Unit division: 0

## 2013-11-09 LAB — COMPREHENSIVE METABOLIC PANEL
ALT: 145 U/L — ABNORMAL HIGH (ref 0–53)
AST: 59 U/L — ABNORMAL HIGH (ref 0–37)
Albumin: 2.5 g/dL — ABNORMAL LOW (ref 3.5–5.2)
Alkaline Phosphatase: 105 U/L (ref 39–117)
BUN: 26 mg/dL — ABNORMAL HIGH (ref 6–23)
CO2: 25 mEq/L (ref 19–32)
Calcium: 8.1 mg/dL — ABNORMAL LOW (ref 8.4–10.5)
Chloride: 102 mEq/L (ref 96–112)
Creatinine, Ser: 1.2 mg/dL (ref 0.50–1.35)
GFR calc Af Amer: 69 mL/min — ABNORMAL LOW (ref >90)
GFR calc non Af Amer: 60 mL/min — ABNORMAL LOW (ref >90)
Glucose, Bld: 161 mg/dL — ABNORMAL HIGH (ref 70–99)
Potassium: 3.7 mEq/L (ref 3.7–5.3)
Sodium: 140 mEq/L (ref 137–147)
Total Bilirubin: 0.9 mg/dL (ref 0.3–1.2)
Total Protein: 6.3 g/dL (ref 6.0–8.3)

## 2013-11-09 LAB — POCT I-STAT 3, ART BLOOD GAS (G3+)
Acid-Base Excess: 1 mmol/L (ref 0.0–2.0)
Bicarbonate: 26.1 mEq/L — ABNORMAL HIGH (ref 20.0–24.0)
O2 Saturation: 94 %
Patient temperature: 98.4
TCO2: 27 mmol/L (ref 0–100)
pCO2 arterial: 42 mmHg (ref 35.0–45.0)
pH, Arterial: 7.402 (ref 7.350–7.450)
pO2, Arterial: 69 mmHg — ABNORMAL LOW (ref 80.0–100.0)

## 2013-11-09 LAB — CBC
HCT: 29 % — ABNORMAL LOW (ref 39.0–52.0)
Hemoglobin: 9.6 g/dL — ABNORMAL LOW (ref 13.0–17.0)
MCH: 29.5 pg (ref 26.0–34.0)
MCHC: 33.1 g/dL (ref 30.0–36.0)
MCV: 89.2 fL (ref 78.0–100.0)
Platelets: 133 10*3/uL — ABNORMAL LOW (ref 150–400)
RBC: 3.25 MIL/uL — ABNORMAL LOW (ref 4.22–5.81)
RDW: 17 % — ABNORMAL HIGH (ref 11.5–15.5)
WBC: 9.7 10*3/uL (ref 4.0–10.5)

## 2013-11-09 LAB — CARBOXYHEMOGLOBIN
Carboxyhemoglobin: 2.1 % — ABNORMAL HIGH (ref 0.5–1.5)
Methemoglobin: 1.7 % — ABNORMAL HIGH (ref 0.0–1.5)
O2 Saturation: 75.2 %
Total hemoglobin: 9.4 g/dL — ABNORMAL LOW (ref 13.5–18.0)

## 2013-11-09 LAB — GLUCOSE, CAPILLARY
Glucose-Capillary: 120 mg/dL — ABNORMAL HIGH (ref 70–99)
Glucose-Capillary: 121 mg/dL — ABNORMAL HIGH (ref 70–99)
Glucose-Capillary: 133 mg/dL — ABNORMAL HIGH (ref 70–99)
Glucose-Capillary: 135 mg/dL — ABNORMAL HIGH (ref 70–99)
Glucose-Capillary: 140 mg/dL — ABNORMAL HIGH (ref 70–99)
Glucose-Capillary: 143 mg/dL — ABNORMAL HIGH (ref 70–99)

## 2013-11-09 LAB — AMYLASE: Amylase: 24 U/L (ref 0–105)

## 2013-11-09 LAB — PHOSPHORUS: Phosphorus: 2.9 mg/dL (ref 2.3–4.6)

## 2013-11-09 LAB — APTT
aPTT: 47 seconds — ABNORMAL HIGH (ref 24–37)
aPTT: 43 seconds — ABNORMAL HIGH (ref 24–37)

## 2013-11-09 LAB — PROTIME-INR
INR: 1.34 (ref 0.00–1.49)
Prothrombin Time: 16.3 seconds — ABNORMAL HIGH (ref 11.6–15.2)

## 2013-11-09 LAB — CULTURE, RESPIRATORY W GRAM STAIN

## 2013-11-09 LAB — VANCOMYCIN, TROUGH: Vancomycin Tr: 14.8 ug/mL (ref 10.0–20.0)

## 2013-11-09 LAB — CULTURE, RESPIRATORY

## 2013-11-09 LAB — MAGNESIUM: Magnesium: 2 mg/dL (ref 1.5–2.5)

## 2013-11-09 MED ORDER — HEPARIN SODIUM (PORCINE) 1000 UNIT/ML IJ SOLN
1000.0000 [IU] | Freq: Once | INTRAMUSCULAR | Status: AC
  Administered 2013-11-09: 1000 [IU] via INTRAVENOUS

## 2013-11-09 MED ORDER — HEPARIN (PORCINE) IN NACL 100-0.45 UNIT/ML-% IJ SOLN
1450.0000 [IU]/h | INTRAMUSCULAR | Status: DC
  Administered 2013-11-09: 900 [IU]/h via INTRAVENOUS
  Administered 2013-11-10: 1000 [IU]/h via INTRAVENOUS
  Administered 2013-11-11: 1050 [IU]/h via INTRAVENOUS
  Administered 2013-11-12: 1100 [IU]/h via INTRAVENOUS
  Administered 2013-11-13 – 2013-11-14 (×2): 1200 [IU]/h via INTRAVENOUS
  Administered 2013-11-15: 1400 [IU]/h via INTRAVENOUS
  Administered 2013-11-15: 1250 [IU]/h via INTRAVENOUS
  Administered 2013-11-15 – 2013-11-16 (×2): 1500 [IU]/h via INTRAVENOUS
  Administered 2013-11-17: 1450 [IU]/h via INTRAVENOUS
  Filled 2013-11-09 (×16): qty 250

## 2013-11-09 MED ORDER — FAT EMULSION 20 % IV EMUL
250.0000 mL | INTRAVENOUS | Status: AC
  Administered 2013-11-09: 250 mL via INTRAVENOUS
  Filled 2013-11-09: qty 250

## 2013-11-09 MED ORDER — M.V.I. ADULT IV INJ
INTRAVENOUS | Status: AC
  Administered 2013-11-09: 18:00:00 via INTRAVENOUS
  Filled 2013-11-09: qty 2000

## 2013-11-09 MED ORDER — FUROSEMIDE 10 MG/ML IJ SOLN
40.0000 mg | Freq: Once | INTRAMUSCULAR | Status: AC
  Administered 2013-11-09: 40 mg via INTRAVENOUS
  Filled 2013-11-09: qty 4

## 2013-11-09 MED ORDER — POTASSIUM CHLORIDE 10 MEQ/50ML IV SOLN
10.0000 meq | INTRAVENOUS | Status: AC
  Administered 2013-11-09 (×3): 10 meq via INTRAVENOUS
  Filled 2013-11-09 (×3): qty 50

## 2013-11-09 MED ORDER — INSULIN ASPART 100 UNIT/ML ~~LOC~~ SOLN
0.0000 [IU] | SUBCUTANEOUS | Status: DC
  Administered 2013-11-09 – 2013-11-10 (×3): 1 [IU] via SUBCUTANEOUS
  Administered 2013-11-10: 2 [IU] via SUBCUTANEOUS
  Administered 2013-11-10 (×2): 1 [IU] via SUBCUTANEOUS
  Administered 2013-11-11 (×2): 2 [IU] via SUBCUTANEOUS
  Administered 2013-11-11: 1 [IU] via SUBCUTANEOUS
  Administered 2013-11-11 – 2013-11-13 (×7): 2 [IU] via SUBCUTANEOUS
  Administered 2013-11-13 (×3): 1 [IU] via SUBCUTANEOUS
  Administered 2013-11-13: 2 [IU] via SUBCUTANEOUS
  Administered 2013-11-14 (×2): 1 [IU] via SUBCUTANEOUS

## 2013-11-09 MED ORDER — SODIUM CHLORIDE 0.9 % IV BOLUS (SEPSIS)
500.0000 mL | Freq: Once | INTRAVENOUS | Status: AC
  Administered 2013-11-09: 500 mL via INTRAVENOUS

## 2013-11-09 MED ORDER — POTASSIUM CHLORIDE 10 MEQ/50ML IV SOLN: 10.0000 meq | INTRAVENOUS | Status: DC

## 2013-11-09 MED ORDER — ALBUMIN HUMAN 5 % IV SOLN
12.5000 g | Freq: Once | INTRAVENOUS | Status: AC
  Administered 2013-11-09: 12.5 g via INTRAVENOUS
  Filled 2013-11-09: qty 250

## 2013-11-09 NOTE — Progress Notes (Signed)
Patient ID: Johnny Oneill, male   DOB: 04-Jul-1941, 73 y.o.   MRN: [id number]  SICU Evening Rounds:  Hemodynamically labile, still requiring some levophed to support BP. CVP is 6 this evening. Excellent urine output today after some lasix early this am.  Pump flow 4.5, PI 5.4.  Remains sedated on vent but opens eyes and responds.  Did not do well with weaning trials today.  Continue current support and wean levophed as tolerated. Does not need further diuresis at this time. He is -2600 cc today with some volume replacement.

## 2013-11-09 NOTE — Progress Notes (Signed)
Advanced Heart Failure Team Rounding Note     Subjective:    Johnny Oneill is a 73 year old patient with a history of CAD s/p stent to LCX 2004 with re-occlusion of LCX with lateral MI 2009 and repeat stenting, total occlusion of RCA, ICM, chronic systolic heart failure, hypothyroidism, HLD and arthritis. 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   Admitted to [facility] 09/13/13 through 10/03/13. Initially admitted with decompenstaed heart failure and low output. Required IABP for stabilization. He ultimately had LVAD HM II placed on 09/19/13. He suffered from RV failure after the procedure and required milrinone for >7 days which was gradually weaned to off. During his stay he did develop acute ICU psychosis with delirium which was treated with Zyprexa. Once he was transferred out of the ICU this improved and he returned back to baseline. He was not discharged on beta blocker. D/C weight 151 pounds.   Admitted on 3/4 with SBO/ileus. Was improving then had cardiac arrest last night (3/8) with PEA in setting of R hemothorax. Now s/p R chest tube and multiple units transfusion.   Underwent R VATs 3/9 then bronch on 3/10. Remains intubated. Failed wean yesterday. This am BP low after receiving lasix. Levophed increased. Had enema with BM last night. CT drainage minimal.   CXR with persistent diffuse RUL opacity. Co-ox 75%   LVAD INTERROGATION:  HeartMate II LVAD: Flow 4.1 liters/min, speed 9400, power 4.7, PI 5.3  No PI events  I reviewed the LVAD parameters from today, and compared the results to the patient's prior recorded data. No programming changes were made. The LVAD is functioning within specified parameters. The patient performs LVAD self-test daily. LVAD interrogation was negative for any significant power changes, alarms or PI events/speed drops. LVAD equipment check completed and is in good working order. Back-up equipment present. LVAD education done on emergency procedures and  precautions and reviewed exit site care.   Physical Exam: GENERAL: intubated. sedated HEENT: normal  +ET & NGT NECK: Supple CARDIAC: Mechanical heart sounds with LVAD hum present.  LUNGS: R chest tube.. Decreased BS on R.  ABDOMEN: minimally distended (improved), nontender,  bowel sounds picking up  LVAD exit site: well-healed and incorporated. Dressing dry and intact. No erythema or drainage. Stabilization device present and accurately applied. Driveline dressing is being changed daily per sterile technique.  EXTREMITIES: Warm and dry, no cyanosis, clubbing, rash or edema  NEUROLOGIC: Intubated sedated  Telemetry: SR   Labs: Basic Metabolic Panel:  Recent Labs Lab 11/03/13 0742  11/06/13 1500 11/07/13 0400 11/08/13 0415 11/08/13 1800 11/09/13 0400  NA 135*  < > 134* 138 137 140 140  K 4.1  < > 4.5 4.1 3.4* 3.5* 3.7  CL 100  < > 99 102 101 103 102  CO2 25  < > 23 21 24 26 25   GLUCOSE 119*  < > 118* 97 137* 126* 161*  BUN 20  < > 33* 33* 31* 27* 26*  CREATININE 1.06  < > 1.44* 1.22 1.26 1.25 1.20  CALCIUM 8.7  < > 8.5 8.4 8.1* 8.0* 8.1*  MG 2.1  --   --  1.7 2.0  --  2.0  PHOS 3.8  --   --  3.4 3.3  --  2.9  < > = values in this interval not displayed.  Liver Function Tests:  Recent Labs Lab 11/06/13 0500 11/06/13 0855 11/07/13 0400 11/08/13 0415 11/09/13 0400  AST 570* 490* 259* 131* 59*  ALT 332* 312* 233*  196* 145*  ALKPHOS 95 89 81 104 105  BILITOT 1.4* 1.1 1.4* 0.9 0.9  PROT 5.8* 5.8* 5.6* 5.7* 6.3  ALBUMIN 2.6* 2.6* 2.8* 2.5* 2.5*   No results found for this basename: LIPASE, AMYLASE,  in the last 168 hours No results found for this basename: AMMONIA,  in the last 168 hours  CBC:  Recent Labs Lab 11/03/13 0742  11/06/13 2300 11/07/13 0400 11/07/13 1600 11/08/13 0415 11/09/13 0400  WBC 8.7  < > 8.6 7.2 11.1* 10.2 9.7  NEUTROABS 6.5  --   --   --   --   --   --   HGB 10.8*  < > 9.2* 9.1* 9.4* 9.0* 9.6*  HCT 34.4*  < > 25.8* 25.6* 27.0* 27.1*  29.0*  MCV 85.8  < > 84.0 84.8 86.3 87.7 89.2  PLT 248  < > 83* 80* 100* 112* 133*  < > = values in this interval not displayed.  Cardiac Enzymes: No results found for this basename: CKTOTAL, CKMB, CKMBINDEX, TROPONINI,  in the last 168 hours  BNP: BNP (last 3 results)  Recent Labs  09/13/13 1349 09/25/13 0400 11/01/13 1033  PROBNP 3883.0* 7019.0* 877.6*    CBG:  Recent Labs Lab 11/08/13 1630 11/08/13 1921 11/08/13 2335 11/09/13 0331 11/09/13 0736  GLUCAP 113* 144* 132* 135* 120*    Coagulation Studies:  Recent Labs  11/07/13 0400 11/08/13 0415 11/09/13 0400  LABPROT 24.1* 19.0* 16.3*  INR 2.24* 1.64* 1.34    Other results:   Imaging: Dg Chest Port 1 View  11/09/2013   CLINICAL DATA Status post video-assisted thoracoscopy, evacuation hematoma  EXAM PORTABLE CHEST - 1 VIEW  COMPARISON DG CHEST 1V PORT dated 11/08/2013  FINDINGS ET tube terminates 2.3 cm superior to the carina. Right IJ central venous catheter tip projects over the superior vena cava. Enteric tube courses inferior to the diaphragm, tip not included on this examination. Single right chest tube remains in place. Multi lead AICD device overlying the left hemi thorax, grossly stable in positioning.  Stable enlarged cardiac and mediastinal contours. LVAD device incompletely evaluated. Persistent homogeneous opacification of the right upper hemi thorax, details of which are largely obscured due to overlying devices. Aeration of the right lower lung and left lung grossly stable from prior. No definite large pneumothorax identified.  IMPRESSION 1. Grossly stable lines and tubes. 2. Grossly stable loculated fluid within the right upper hemi thorax, largely obscured due to overlying devices.  SIGNATURE  Electronically Signed   By: Lovey Newcomer M.D.   On: 11/09/2013 08:00   Dg Chest Port 1 View  11/08/2013   CLINICAL DATA Endotracheal tube position.  EXAM PORTABLE CHEST - 1 VIEW  COMPARISON 11/07/2013  FINDINGS  Endotracheal tube remains in place with tip approximately 2.5 cm above the carina. Right jugular central venous catheter is unchanged with tip overlying the mid to lower SVC. Left-sided pacemaker remains in place. Left ventricular assist device is partially visualized. Right-sided chest tube remains in place. Enteric tube courses into the left upper abdomen.  Dense opacity in the mid and upper right hemithorax all is stable to slightly improved. Interstitial opacities bilaterally have improved. No pneumothorax is identified. Cardiac silhouette remains enlarged.  IMPRESSION 1. Unchanged appearance of support lines and tubes. 2. Stable to slightly improved loculated right pleural effusion. 3. Improved bilateral interstitial lung opacities.  SIGNATURE  Electronically Signed   By: Logan Bores   On: 11/08/2013 07:35   Dg Abd Portable 1v  11/09/2013   CLINICAL DATA Follow-up ileus  EXAM PORTABLE ABDOMEN - 1 VIEW  COMPARISON DG ABD PORTABLE 1V dated 11/08/2013  FINDINGS There is a moderate amount of stool in the right colon. There is stool and gas in the transverse and descending portions of the colon and in the rectum. The in volume of gas and stool appears has decreased slightly since the previous study. No small bowel obstruction or ileus is demonstrated. The esophagogastric tube tip and proximal port lie in the region of the distal stomach. The ventricular assist device is visible over the upper abdomen. A right-sided chest tube tip lies in the deep posterior medial costophrenic sulcus on the right. There is a femoral vascular catheter whose tip projects over the right sacral ala. There are degenerative changes of the lumbar spine. Partially imaged monitoring devices are present over the lower bony pelvis.  IMPRESSION The volume of stool and gas in the colon has decreased consistent with resolving ileus. There is no evidence of obstruction or free extraluminal gas collections. Support tubes and lines are present.   SIGNATURE  Electronically Signed   By: David  Martinique   On: 11/09/2013 08:06   Dg Abd Portable 1v  11/08/2013   CLINICAL DATA Ileus  EXAM PORTABLE ABDOMEN - 1 VIEW  COMPARISON DG ABD PORTABLE 1V dated 11/07/2013; DG ABD PORTABLE 1V dated 11/04/2013; CT ABD/PELV WO CM dated 11/06/2013  FINDINGS Enteric contrast remains primarily within the ascending and transverse colon. There is no significant gaseous distention of either the large or small bowel.  No supine evidence of pneumoperitoneum. No definite pneumatosis or portal venous gas. Enteric tube tip and side port overlie the expected location of the gastric antrum.  Limited visualization of the lower thorax demonstrates the sequela of LVAD placement and median sternotomy. Note is made of a basal lead directed left-sided chest tube. Right femoral approach central venous catheter overlies the right sacral ale a.  No acute osseus abnormalities.  IMPRESSION Enteric contrast remains within the colon suggestive of mild ileus. No dilatation of the upstream small bowel to suggest obstruction.  SIGNATURE  Electronically Signed   By: Sandi Mariscal M.D.   On: 11/08/2013 08:14        Assessment:   1. Hemorrhagic shock due to R hemothorax    --s/p R chest tube 3/8 2. 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 CAD 4. Hypothyroidism 5. SBO 6. Acute blood loss anemia 7. PEA arrest 8. Shock liver 9. A/C renal failure, stage III 10. Ventilatory-dependent respiratory failure 11. Hypokalemia  Plan/Discussion:    Remains intubated. Failed wean yesterday. CXR with diffuse RUL opacity otherwise improved. CT drainage minimal. Will reattempt wean today but suspect he may not be ready yet.   MAPs running low. CVP 7. Will give 500cc NS and hold lasix. Keep CVP 10-14 range. Wean levophed as tolerated.   Ileus improving. Dobhoff to be placed today.   Hgb stable. On heparin/coumadin. INR 1.3  VAD parameters look good.  The patient is critically ill with multiple organ  systems failure and requires high complexity decision making for assessment and support, frequent evaluation and titration of therapies, application of advanced monitoring technologies and extensive interpretation of multiple databases.   Critical Care Time devoted to patient care services described in this note is 35 Minutes.  Length of Stay: 8 Glori Bickers MD 11/09/2013, 9:04 AM  Advanced Heart Failure Team Pager [phone] (M-F; [place])  Please contact [facility] for night-coverage after  hours (4p -7a ) and weekends on [email or web address]

## 2013-11-09 NOTE — Progress Notes (Signed)
ANTIBIOTIC CONSULT NOTE   Pharmacy Consult for Vancomycin and Zosyn Indication: empiric for rule-out sepsis   Patient Measurements: Height: 5\' 9"  (175.3 cm) Weight: 154 lb 8.7 oz (70.1 kg) IBW/kg (Calculated) : 70.7  Vital Signs: Temp: 99 F (37.2 C) (03/12 1530) Temp src: Core (Comment) (03/12 1530) Pulse Rate: 76 (03/12 1700) Intake/Output from previous day: 03/11 0701 - 03/12 0700 In: 2880.8 [I.V.:418.3; NG/GT:90; IV Piggyback:625; TPN:1747.5] Out: 3810 [Urine:3580; Emesis/NG output:200; Chest Tube:30] Intake/Output from this shift: Total I/O In: 1491.3 [P.O.:30; I.V.:181.3; Other:30; NG/GT:120; IV Piggyback:200; TPN:930] Out: 4865 [id number]; Emesis/NG output:500; Stool:850; Chest Tube:20]  Labs:  Recent Labs  11/07/13 1600 11/08/13 0415 11/08/13 1800 11/09/13 0400  WBC 11.1* 10.2  --  9.7  HGB 9.4* 9.0*  --  9.6*  PLT 100* 112*  --  133*  CREATININE  --  1.26 1.25 1.20   Estimated Creatinine Clearance: 56.8 ml/min (by C-G formula based on Cr of 1.2).  Recent Labs  11/09/13 1715  [place] 14.8     Microbiology: Recent Results (from the past 720 hour(s))  MRSA PCR SCREENING     Status: None   Collection Time    11/01/13  9:37 PM      Result Value Ref Range Status   MRSA by PCR NEGATIVE  NEGATIVE Final   Comment:            The GeneXpert MRSA Assay (FDA     approved for NASAL specimens     only), is one component of a     comprehensive MRSA colonization     surveillance program. It is not     intended to diagnose MRSA     infection nor to guide or     monitor treatment for     MRSA infections.  CULTURE, BLOOD (ROUTINE X 2)     Status: None   Collection Time    11/05/13  3:56 AM      Result Value Ref Range Status   Specimen Description BLOOD RIGHT HAND   Final   Special Requests BOTTLES DRAWN AEROBIC ONLY 1CC   Final   Culture  Setup Time     Final   Value: 11/06/2013 08:47     Performed at [facility]   Culture     Final   Value:         BLOOD CULTURE RECEIVED NO GROWTH TO DATE CULTURE WILL BE HELD FOR 5 DAYS BEFORE ISSUING A FINAL NEGATIVE REPORT     Performed at [facility]   Report Status PENDING   Incomplete  CULTURE, BLOOD (ROUTINE X 2)     Status: None   Collection Time    11/05/13  4:06 AM      Result Value Ref Range Status   Specimen Description BLOOD LEFT FOREARM   Final   Special Requests BOTTLES DRAWN AEROBIC ONLY 3CC   Final   Culture  Setup Time     Final   Value: 11/06/2013 08:46     Performed at [facility]   Culture     Final   Value:        BLOOD CULTURE RECEIVED NO GROWTH TO DATE CULTURE WILL BE HELD FOR 5 DAYS BEFORE ISSUING A FINAL NEGATIVE REPORT     Performed at [facility]   Report Status PENDING   Incomplete  CULTURE, RESPIRATORY (NON-EXPECTORATED)     Status: None   Collection Time    11/06/13  3:57 PM  Result Value Ref Range Status   Specimen Description TRACHEAL ASPIRATE   Final   Special Requests Normal   Final   Gram Stain     Final   Value: RARE WBC PRESENT, PREDOMINANTLY PMN     NO SQUAMOUS EPITHELIAL CELLS SEEN     RARE GRAM POSITIVE COCCI IN PAIRS     Performed at [facility]   Culture     Final   Value: Non-Pathogenic Oropharyngeal-type Flora Isolated.     Performed at [facility]   Report Status 11/08/2013 FINAL   Final  CULTURE, RESPIRATORY (NON-EXPECTORATED)     Status: None   Collection Time    11/07/13 11:05 AM      Result Value Ref Range Status   Specimen Description TRACHEAL ASPIRATE   Final   Special Requests NONE   Final   Gram Stain     Final   Value: FEW WBC PRESENT, PREDOMINANTLY PMN     NO SQUAMOUS EPITHELIAL CELLS SEEN     NO ORGANISMS SEEN     Performed at [facility]   Culture     Final   Value: NO GROWTH 2 DAYS     Performed at [facility]   Report Status 11/09/2013 FINAL   Final    Assessment: 73 year old male s/p LVAD 2 months ago s/p code blue on 3/8 began on empiric  Vancomycin and Zosyn for rule-out sepsis with elevated LA and PCT.   Blood cultures are no growth and respiratory cultures show normal flora.  Vancomycin trough is 14.8 which is therapeutic   Goal of Therapy:  Vancomycin trough level 15-20 mcg/ml  Plan:  Continue Vancomycin to 750mg  IV q12h Zosyn 3.375gm IV q8h extended infusion Monitor renal function closely   Heide Guile, PharmD, BCPS Clinical Pharmacist Pager [phone]  11/09/2013 6:14 PM

## 2013-11-09 NOTE — Progress Notes (Signed)
PARENTERAL NUTRITION CONSULT NOTE: F/U  Pharmacy Consult:  TPN Indication: Ileus  Allergies  Allergen Reactions  . Diovan [Valsartan] Other (See Comments)    Hypotension at low dose, hyperkalemia  . Lipitor [Atorvastatin Calcium] Other (See Comments)    Muscle pain    Patient Measurements: Height: 5\' 9"  (175.3 cm) Weight: 154 lb 8.7 oz (70.1 kg) IBW/kg (Calculated) : 70.7 Usual Weight: 68 kg  Vital Signs: Temp: 99.5 F (37.5 C) (03/12 0738) Temp src: Core (Comment) (03/12 0738) BP: 86/76 mmHg (03/12 0435) Pulse Rate: 78 (03/12 0727) Intake/Output from previous day: 03/11 0701 - 03/12 0700 In: 2880.8 [I.V.:418.3; NG/GT:90; IV Piggyback:625; TPN:1747.5] Out: 3810 [Urine:3580; Emesis/NG output:200; Chest Tube:30]  Labs:  Recent Labs  11/07/13 0400 11/07/13 1600 11/08/13 0415 11/08/13 1800 11/09/13 0400  WBC 7.2 11.1* 10.2  --  9.7  HGB 9.1* 9.4* 9.0*  --  9.6*  HCT 25.6* 27.0* 27.1*  --  29.0*  PLT 80* 100* 112*  --  133*  APTT 53* 59* 48* 44* 47*  INR 2.24*  --  1.64*  --  1.34     Recent Labs  11/07/13 0400 11/07/13 1110 11/08/13 0415 11/08/13 1800 11/09/13 0400  NA 138  --  137 140 140  K 4.1  --  3.4* 3.5* 3.7  CL 102  --  101 103 102  CO2 21  --  24 26 25   GLUCOSE 97  --  137* 126* 161*  BUN 33*  --  31* 27* 26*  CREATININE 1.22  --  1.26 1.25 1.20  CALCIUM 8.4  --  8.1* 8.0* 8.1*  MG 1.7  --  2.0  --  2.0  PHOS 3.4  --  3.3  --  2.9  PROT 5.6*  --  5.7*  --  6.3  ALBUMIN 2.8*  --  2.5*  --  2.5*  AST 259*  --  131*  --  59*  ALT 233*  --  196*  --  145*  ALKPHOS 81  --  104  --  105  BILITOT 1.4*  --  0.9  --  0.9  PREALBUMIN  --  10.4* 8.7*  --   --   TRIG  --  117 112  --   --    Estimated Creatinine Clearance: 56.8 ml/min (by C-G formula based on Cr of 1.2).    Recent Labs  11/08/13 1921 11/08/13 2335 11/09/13 0331  GLUCAP 144* 132* 135*     Insulin Requirements in the past 24 hours:  2 unit sensitive SSI/24h with CBGs  113-144/24h  Assessment: 40 YOM with history of recent LVAD and discharged on 09/19/13.  Patient returned with N/V and CT scan revealed SBO.   GI:  SBO believed to have resolved. Had flatus and several BMs. TPN d/c'd 3/7. Resumed TPN 3/10 for inability to take po's/TFs and high NG output . Emesis/NG=150 down. No recent BM noted since 3/8. No BS. Probably ileus Prealbumin 10.4. SMOG enema + sorbitol ordered. IV Reglan QID, IV PPI  Endo: hypothyroid on Synthroid (dose increased).  No hx DM - CBGs controlled on SSI  Lytes: K 3.7 Ca 8.1 but 9.3 adjusted. Ca/Phos product: 27 ok. Kruns x 3 ordered by MD.  Renal: Scr 1.2. UOP 2.1  Cards: CAD s/p stent to LCX 2004 with re-occlusion of LCX and lateral MI 2009 and repeat stenting, total occlusion of RCA, ICM, chronic sHF, s/p LVAD 09/19/13 -Cardiac arrest 3/8 with PEA with R hemothorax. Cardiogenic/hemorrhagic  shock. 3/9: right VATS and evacuation of hematoma-hemothorax. Levophed drip  Hepatobil: Shock liver s/p PEA arrest 3/8. Tbili 0.9with elevated LFTs improving. INR 2.24.  Heme: Hgb 9.6. Plts improved to 133.  Resp: VDRF. Has R-sided chest tube and large R pleural effusion concerning for hemorrhage (on 3/9 CT). Fent/Versed drips. FiO140%. Chest tube with 30 output.  ID: Vanco/Zosyn #8. WBc 9.7. Tc=99.5  Anticoag: LVAD. Coumadin PTA with INR 3.5 upon admit. Reversed. Heparin drip for LVAD low dose at 38ml/hr per MD. INR down to 1.34 from shock liver.  Best Practices: PPI IV, MC,  TPN Access: CVC placed 11/02/13  TPN day#: 3/5-3/8, resume 3/10  Current Nutrition:  TPN at goal 44ml/hr = 1894kcal and 99.6g protein  Nutritional Goals:  1750-1850 kcal and 105-115g protein per RD note 3/10.   Plan:  - Increase Clinimix E to 39ml/hr. - Daily IV multivitamin in TPN - Trace elements on M/W/F due to [employer] S. Alford Highland, PharmD, [place] Clinical Staff Pharmacist Pager [phone]  11/09/2013, 8:00 AM

## 2013-11-09 NOTE — Progress Notes (Signed)
CSW called [facility] to see how to be able to send hospital letter in regards to patient's jury duty. CM wrote letter and social worker is awaiting patient's wife to bring in a letter explaining his jury duty. CSW will then send both letters to [employer] by mail as directed by [employer]. CSW left voicemail and is anticipating call back.  Jeanette Caprice, MSW, [place]

## 2013-11-09 NOTE — Progress Notes (Signed)
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  ET tube in place 3/9 CT Chest abd pelvis Endotracheal tube in grossly good position. Ventricular assistance device and left-sided pacemaker are noted. Right-sided chest tube has been placed with distal tip directed toward posterior portion of chest. Large right pleural effusion is noted with high density material layering within it concerning for hemorrhage  LINES / TUBES: [facility] CVL 3/5 Right Femoral CVL 3/5 Right Femoral A line 3/5  CULTURES: BAL 3/10>>>NTD Sputum 3/9>>>NTD Blood 3/8>>>NTD  ANTIBIOTICS: Zosyn 3/5>>> Vancomcyin 3/5>>>  SUBJECTIVE: Sedated and intubated, no events overnight.  VITAL SIGNS: Temp:  [98 F (36.7 C)-99.5 F (37.5 C)] 99.5 F (37.5 C) (03/12 0738) Pulse Rate:  [69-95] 77 (03/12 0900) Resp:  [13-24] 16 (03/12 0900) BP: (86-98)/(76-81) 86/76 mmHg (03/12 0435) SpO2:  [94 %-100 %] 100 % (03/12 0900) Arterial Line BP: (70-106)/(57-86) 100/86 mmHg (03/12 0900) FiO2 (%):  [40 %] 40 % (03/12 0800) Weight:  [70.1 kg (154 lb 8.7 oz)] 70.1 kg (154 lb 8.7 oz) (03/12 0500)  HEMODYNAMICS: CVP:  [7 mmHg-20 mmHg] 7 mmHg  VENTILATOR SETTINGS: Vent Mode:  [-] PRVC FiO2 (%):  [40 %] 40 % Set Rate:  [14 bmp] 14 bmp Vt Set:  [500 mL] 500 mL PEEP:  [5 cmH20] 5 cmH20 Pressure Support:  [16 cmH20] 16 cmH20 Plateau Pressure:  [18 cmH20-25 cmH20] 21 cmH20  INTAKE / OUTPUT: Intake/Output     03/11 0701 - 03/12 0700 03/12 0701 - 03/13 0700   P.O.  30   I.V. (mL/kg) 418.3  (6) 36 (0.5)   NG/GT 90    IV Piggyback 625 50   TPN 1747.5 186   Total Intake(mL/kg) 2880.8 (41.1) 302 (4.3)   Urine (mL/kg/hr) 3580 (2.1) 270 (1.3)   Emesis/NG output 200 (0.1)    Chest Tube 30 (0) 20 (0.1)   Total Output 3810 290   Net -929.2 +12         PHYSICAL EXAMINATION: General: Chronically ill appearing male, sedated. Neuro: Sedated and intubated, moves all ext to pain. HEENT: [place]/AT, PERRL, EOMI, pale conjunctiva. Cardiovascular: RRR, Nl S1/S2, -M/R/G. Lungs: Coarse BS diffusely. Abdomen: Soft, NT, ND and +BS. Musculoskeletal:  No lower extremity edema, unable to palpate pulses Skin:  Pale, no ecchymosis, no evidence of retropertoneal bleed  LABS:  Recent Labs Lab 11/05/13 2122  11/06/13 0156  11/06/13 0500  11/06/13 0535  11/07/13 0400  11/07/13 1138 11/07/13 1600 11/08/13 0415 11/08/13 0419 11/08/13 1800 11/09/13 0400 11/09/13 0402  HGB  --   < >  --   --  11.4*  --   --   < > 9.1*  --   --  9.4* 9.0*  --   --  9.6*  --   WBC  --   < >  --   --  17.5*  --   --   < > 7.2  --   --  11.1* 10.2  --   --  9.7  --   PLT  --   < >  --   --  131*  --   --   < > 80*  --   --  100* 112*  --   --  133*  --   NA  --   < >  --   --  135*  --   --   < > 138  --   --   --  137  --  140 140  --   K  --   < >  --   --  3.9  --   --   < > 4.1  --   --   --  3.4*  --  3.5* 3.7  --   CL  --   < >  --   --  97  --   --   < > 102  --   --   --  101  --  103 102  --   CO2  --   < >  --   --  21  --   --   < > 21  --   --   --  24  --  26 25  --   GLUCOSE  --   < >  --   --  169*  --   --   < > 97  --   --   --  137*  --  126* 161*  --   BUN  --   < >  --   --  31*  --   --   < > 33*  --   --   --  31*  --  27* 26*  --   CREATININE  --   < >  --   --  1.51*  --   --   < > 1.22  --   --   --  1.26  --  1.25 1.20  --   CALCIUM  --   < >  --   --  8.4  --   --   < > 8.4  --   --   --  8.1*  --  8.0* 8.1*  --   MG  --   --   --   --   --   --   --   --  1.7  --   --   --  2.0  --    --  2.0  --   PHOS  --   --   --   --   --   --   --   --  3.4  --   --   --  3.3  --   --  2.9  --   AST  --   < >  --   --  570*  --   --   < > 259*  --   --   --  131*  --   --  59*  --   ALT  --   < >  --   --  332*  --   --   < > 233*  --   --   --  196*  --   --  145*  --   ALKPHOS  --   < >  --   --  95  --   --   < > 81  --   --   --  104  --   --  105  --  BILITOT  --   < >  --   --  1.4*  --   --   < > 1.4*  --   --   --  0.9  --   --  0.9  --   PROT  --   < >  --   --  5.8*  --   --   < > 5.6*  --   --   --  5.7*  --   --  6.3  --   ALBUMIN  --   < >  --   --  2.6*  --   --   < > 2.8*  --   --   --  2.5*  --   --  2.5*  --   APTT  --   --   --   --   --   --   --   < > 53*  --   --  59* 48*  --  44* 47*  --   INR  --   < >  --   --  1.59*  --   --   < > 2.24*  --   --   --  1.64*  --   --  1.34  --   LATICACIDVEN 3.2*  --  5.7*  --   --   --   --   --   --   --   --   --   --   --   --   --   --   PROCALCITON  --   --   --   --   --   --  2.81  --   --   --   --   --   --   --   --   --   --   PHART  --   < >  --   < >  --   < >  --   < >  --   < > 7.361  --   --  7.432  --   --  7.402  PCO2ART  --   < >  --   < >  --   < >  --   < >  --   < > 42.6  --   --  39.6  --   --  42.0  PO2ART  --   < >  --   < >  --   < >  --   < >  --   < > 65.0*  --   --  101.0*  --   --  69.0*  < > = values in this interval not displayed.  Recent Labs Lab 11/08/13 1630 11/08/13 1921 11/08/13 2335 11/09/13 0331 11/09/13 0736  GLUCAP 113* 144* 132* 135* 120*    CXR: Patient with right sided chest tube in place, ET tube in place, cardiomegaly, right subclavian line in place, noted right hemi opacity (improvment)  CT chest Complicated effusion, multiple denisties with layering consistent with blood in right thorax.  No gas pockets in fluid collection  ASSESSMENT / PLAN:  PULMONARY A:  Respiratory Failure Due to Cardiogenic/Hemorrhagic shock  P:   - Acidosis resolved - D/Ced bicarb drip and  decreased RR to 16. - Maintain PEEP at 5 and FiO2 at 40%. - CXR and ABG in AM. - Begin PS trials today, no extubation while hemodynamically unstable. -  Will need further diureses before serious consideration for extubation but not today given hemodynamics.  CARDIOVASCULAR A: Cardiogenic/Hemorrhagic Shock LVAD in place functioning appropriately with good flow Norepinephrine requirements trending downward with vasopressin supplementation (does not constrict pulmonary vasculature in a patient with known RVHF) Off levo overnight. P:  - LVAD in place. - Levo 8 mcg. - Hold diureses for today.  RENAL A:  AKI Acute renal failure Pre-Renal from hemorrhagic shock/Cardiogenic Shock.  Improving.  P: - Renal dose medications. - D/Ced Bicarb drip. - Maintain hemodynamics as able to preserve renal function. - Hold lasix for today. - Replace K.  GASTROINTESTINAL A:  Stress Ulcer Prophylaxis.  Ileus. P:   - Protonix in place - TPN for ileus.  HEMATOLOGIC A:  Hemorrhagic shock Transfusion with FFP, partial reversal of anticoagulation and holding coumadin (risk of bleeding vs LVAD circuit clotting) P:  - Daily CBC.  INFECTIOUS A:  Possible sepsis Multiple sites of possible infection with CVL, LVAD, Urine, Lung with persistent opacity (although likely just evolving bleed) P:   - Zosyn/Vanc as ordered. - Bronch done and cultures sent, NTD.  ENDOCRINE A:  Glucose control   P:   - SSI in place  NEUROLOGIC A:  Hx of Delerium P:   - Versed and fentanyl drips as ordered.  I have personally obtained a history, examined the patient, evaluated laboratory and imaging results, formulated the assessment and plan and placed orders.  CRITICAL CARE: The patient is critically ill with multiple organ systems failure and requires high complexity decision making for assessment and support, frequent evaluation and titration of therapies, application of advanced monitoring technologies and  extensive interpretation of multiple databases. Critical Care Time devoted to patient care services described in this note is 35 minutes.   Rush Farmer, M.D. [facility] Pulmonary/Critical Care Medicine. Pager: [phone]. After hours pager: [phone].

## 2013-11-09 NOTE — Progress Notes (Signed)
3 Days Post-Op Procedure(s) (LRB): VIDEO ASSISTED THORACOSCOPY (Right) EVACUATION HEMATOMA (Right) Subjective: Partial SBO- now ileus R hemothorax, apical extrapleural hematoma related to cenral line,  therapeutic heparin Now VDRF  Bronch washings no growth Prealbumin 10.2 on TNA VAD functioning well w/0 PI events, - adjusting heparin for PTT 55-60, resume coumadin when GI fx returns Objective: Vital signs in last 24 hours: Temp:  [98 F (36.7 C)-99.5 F (37.5 C)] 99.5 F (37.5 C) (03/12 0738) Pulse Rate:  [69-95] 78 (03/12 0727) Cardiac Rhythm:  [-] Normal sinus rhythm (03/12 0600) Resp:  [13-24] 15 (03/12 0727) BP: (86-98)/(76-81) 86/76 mmHg (03/12 0435) SpO2:  [94 %-100 %] 100 % (03/12 0727) Arterial Line BP: (70-106)/(57-86) 89/80 mmHg (03/12 0700) FiO2 (%):  [40 %-50 %] 40 % (03/12 0727) Weight:  [154 lb 8.7 oz (70.1 kg)] 154 lb 8.7 oz (70.1 kg) (03/12 0500)  Hemodynamic parameters for last 24 hours: CVP:  [8 mmHg-20 mmHg] 12 mmHg  Intake/Output from previous day: 03/11 0701 - 03/12 0700 In: 2880.8 [I.V.:418.3; NG/GT:90; IV Piggyback:625; TPN:1747.5] Out: 3810 [Urine:3580; Emesis/NG output:200; Chest Tube:30] Intake/Output this shift:    Lungs clear extrem warm abd less distended after enema  Lab Results:  Recent Labs  11/08/13 0415 11/09/13 0400  WBC 10.2 9.7  HGB 9.0* 9.6*  HCT 27.1* 29.0*  PLT 112* 133*   BMET:  Recent Labs  11/08/13 1800 11/09/13 0400  NA 140 140  K 3.5* 3.7  CL 103 102  CO2 26 25  GLUCOSE 126* 161*  BUN 27* 26*  CREATININE 1.25 1.20  CALCIUM 8.0* 8.1*    PT/INR:  Recent Labs  11/09/13 0400  LABPROT 16.3*  INR 1.34   ABG    Component Value Date/Time   PHART 7.402 11/09/2013 0402   HCO3 26.1* 11/09/2013 0402   TCO2 27 11/09/2013 0402   ACIDBASEDEF 1.0 11/07/2013 1138   O2SAT 75.2 11/09/2013 0536   CBG (last 3)   Recent Labs  11/08/13 2335 11/09/13 0331 11/09/13 0736  GLUCAP 132* 135* 120*     Assessment/Plan: S/P Procedure(s) (LRB): VIDEO ASSISTED THORACOSCOPY (Right) EVACUATION HEMATOMA (Right)   Inc heparin 800 u/hr Vent wean IR to place feeding tube keep I/O even- on TNA 85/hr currently   LOS: 8 days    Johnny Oneill,Johnny Oneill 11/09/2013

## 2013-11-10 ENCOUNTER — Inpatient Hospital Stay (HOSPITAL_COMMUNITY): Payer: Medicare Other

## 2013-11-10 ENCOUNTER — Encounter (HOSPITAL_COMMUNITY): Payer: Self-pay | Admitting: Radiology

## 2013-11-10 LAB — CARBOXYHEMOGLOBIN
Carboxyhemoglobin: 1.8 % — ABNORMAL HIGH (ref 0.5–1.5)
Methemoglobin: 1.3 % (ref 0.0–1.5)
O2 Saturation: 71.1 %
Total hemoglobin: 15.5 g/dL (ref 13.5–18.0)

## 2013-11-10 LAB — CBC
HCT: 27.4 % — ABNORMAL LOW (ref 39.0–52.0)
Hemoglobin: 8.9 g/dL — ABNORMAL LOW (ref 13.0–17.0)
MCH: 29.4 pg (ref 26.0–34.0)
MCHC: 32.5 g/dL (ref 30.0–36.0)
MCV: 90.4 fL (ref 78.0–100.0)
Platelets: 149 10*3/uL — ABNORMAL LOW (ref 150–400)
RBC: 3.03 MIL/uL — ABNORMAL LOW (ref 4.22–5.81)
RDW: 16.9 % — ABNORMAL HIGH (ref 11.5–15.5)
WBC: 7.8 10*3/uL (ref 4.0–10.5)

## 2013-11-10 LAB — POCT I-STAT 3, ART BLOOD GAS (G3+)
Acid-Base Excess: 3 mmol/L — ABNORMAL HIGH (ref 0.0–2.0)
Bicarbonate: 28.2 mEq/L — ABNORMAL HIGH (ref 20.0–24.0)
O2 Saturation: 98 %
Patient temperature: 37.4
TCO2: 30 mmol/L (ref 0–100)
pCO2 arterial: 46.2 mmHg — ABNORMAL HIGH (ref 35.0–45.0)
pH, Arterial: 7.395 (ref 7.350–7.450)
pO2, Arterial: 110 mmHg — ABNORMAL HIGH (ref 80.0–100.0)

## 2013-11-10 LAB — PHOSPHORUS: Phosphorus: 2.5 mg/dL (ref 2.3–4.6)

## 2013-11-10 LAB — PROTIME-INR
INR: 1.31 (ref 0.00–1.49)
Prothrombin Time: 16 seconds — ABNORMAL HIGH (ref 11.6–15.2)

## 2013-11-10 LAB — APTT
aPTT: 48 seconds — ABNORMAL HIGH (ref 24–37)
aPTT: 67 seconds — ABNORMAL HIGH (ref 24–37)

## 2013-11-10 LAB — COMPREHENSIVE METABOLIC PANEL
ALT: 87 U/L — ABNORMAL HIGH (ref 0–53)
AST: 32 U/L (ref 0–37)
Albumin: 2.3 g/dL — ABNORMAL LOW (ref 3.5–5.2)
Alkaline Phosphatase: 110 U/L (ref 39–117)
BUN: 24 mg/dL — ABNORMAL HIGH (ref 6–23)
CO2: 26 mEq/L (ref 19–32)
Calcium: 8 mg/dL — ABNORMAL LOW (ref 8.4–10.5)
Chloride: 99 mEq/L (ref 96–112)
Creatinine, Ser: 1.04 mg/dL (ref 0.50–1.35)
GFR calc Af Amer: 82 mL/min — ABNORMAL LOW (ref >90)
GFR calc non Af Amer: 71 mL/min — ABNORMAL LOW (ref >90)
Glucose, Bld: 143 mg/dL — ABNORMAL HIGH (ref 70–99)
Potassium: 3.9 mEq/L (ref 3.7–5.3)
Sodium: 137 mEq/L (ref 137–147)
Total Bilirubin: 1 mg/dL (ref 0.3–1.2)
Total Protein: 5.8 g/dL — ABNORMAL LOW (ref 6.0–8.3)

## 2013-11-10 LAB — GLUCOSE, CAPILLARY
Glucose-Capillary: 135 mg/dL — ABNORMAL HIGH (ref 70–99)
Glucose-Capillary: 141 mg/dL — ABNORMAL HIGH (ref 70–99)
Glucose-Capillary: 136 mg/dL — ABNORMAL HIGH (ref 70–99)
Glucose-Capillary: 142 mg/dL — ABNORMAL HIGH (ref 70–99)
Glucose-Capillary: 121 mg/dL — ABNORMAL HIGH (ref 70–99)

## 2013-11-10 LAB — MAGNESIUM: Magnesium: 2.1 mg/dL (ref 1.5–2.5)

## 2013-11-10 MED ORDER — DEXMEDETOMIDINE HCL IN NACL 200 MCG/50ML IV SOLN
0.4000 ug/kg/h | INTRAVENOUS | Status: DC
  Administered 2013-11-10: 1.2 ug/kg/h via INTRAVENOUS
  Administered 2013-11-10: 1 ug/kg/h via INTRAVENOUS
  Administered 2013-11-10: 1.2 ug/kg/h via INTRAVENOUS
  Administered 2013-11-10: 1 ug/kg/h via INTRAVENOUS
  Administered 2013-11-10: 0.2 ug/kg/h via INTRAVENOUS
  Administered 2013-11-11: 0.6 ug/kg/h via INTRAVENOUS
  Administered 2013-11-11: 0.8 ug/kg/h via INTRAVENOUS
  Administered 2013-11-11: 1.2 ug/kg/h via INTRAVENOUS
  Administered 2013-11-11 (×3): 1 ug/kg/h via INTRAVENOUS
  Administered 2013-11-11: 0.5 ug/kg/h via INTRAVENOUS
  Administered 2013-11-11: 0.999 ug/kg/h via INTRAVENOUS
  Administered 2013-11-11: 0.6 ug/kg/h via INTRAVENOUS
  Administered 2013-11-11: 0.8 ug/kg/h via INTRAVENOUS
  Administered 2013-11-12: 0.5 ug/kg/h via INTRAVENOUS
  Administered 2013-11-12 (×3): 1 ug/kg/h via INTRAVENOUS
  Filled 2013-11-10 (×2): qty 50
  Filled 2013-11-10: qty 100
  Filled 2013-11-10 (×12): qty 50

## 2013-11-10 MED ORDER — FAT EMULSION 20 % IV EMUL
250.0000 mL | INTRAVENOUS | Status: AC
Start: 2013-11-10 — End: 2013-11-11
  Administered 2013-11-10: 250 mL via INTRAVENOUS
  Filled 2013-11-10: qty 250

## 2013-11-10 MED ORDER — MIDAZOLAM HCL 2 MG/2ML IJ SOLN
INTRAMUSCULAR | Status: AC
  Filled 2013-11-10: qty 6

## 2013-11-10 MED ORDER — FENTANYL CITRATE 0.05 MG/ML IJ SOLN
50.0000 ug | INTRAMUSCULAR | Status: DC | PRN
  Administered 2013-11-10 – 2013-11-11 (×4): 100 ug via INTRAVENOUS
  Filled 2013-11-10 (×3): qty 2

## 2013-11-10 MED ORDER — HEPARIN SODIUM (PORCINE) 1000 UNIT/ML IJ SOLN
1000.0000 [IU] | Freq: Once | INTRAMUSCULAR | Status: AC
  Administered 2013-11-10: 1000 [IU] via INTRAVENOUS

## 2013-11-10 MED ORDER — FENTANYL CITRATE 0.05 MG/ML IJ SOLN
25.0000 ug | INTRAMUSCULAR | Status: DC | PRN
  Administered 2013-11-10: 50 ug via INTRAVENOUS

## 2013-11-10 MED ORDER — FENTANYL CITRATE 0.05 MG/ML IJ SOLN
INTRAMUSCULAR | Status: AC
  Filled 2013-11-10: qty 2

## 2013-11-10 MED ORDER — FENTANYL CITRATE 0.05 MG/ML IJ SOLN
INTRAMUSCULAR | Status: AC | PRN
  Administered 2013-11-10: 25 ug via INTRAVENOUS

## 2013-11-10 MED ORDER — FENTANYL CITRATE 0.05 MG/ML IJ SOLN
INTRAMUSCULAR | Status: AC
  Administered 2013-11-10: 25 ug via INTRAVENOUS
  Filled 2013-11-10: qty 2

## 2013-11-10 MED ORDER — IOHEXOL 300 MG/ML  SOLN
50.0000 mL | Freq: Once | INTRAMUSCULAR | Status: AC | PRN
  Administered 2013-11-10: 50 mL via INTRAVENOUS

## 2013-11-10 MED ORDER — TRACE MINERALS CR-CU-F-FE-I-MN-MO-SE-ZN IV SOLN
INTRAVENOUS | Status: AC
  Administered 2013-11-10: 21:00:00 via INTRAVENOUS
  Filled 2013-11-10: qty 2000

## 2013-11-10 MED ORDER — VITAL AF 1.2 CAL PO LIQD
1000.0000 mL | ORAL | Status: DC
  Filled 2013-11-10 (×6): qty 1000

## 2013-11-10 MED ORDER — FUROSEMIDE 10 MG/ML IJ SOLN
40.0000 mg | Freq: Once | INTRAMUSCULAR | Status: AC
  Administered 2013-11-10: 40 mg via INTRAVENOUS
  Filled 2013-11-10: qty 4

## 2013-11-10 MED ORDER — HEPARIN SOD (PORK) LOCK FLUSH 100 UNIT/ML IV SOLN
500.0000 [IU] | Freq: Once | INTRAVENOUS | Status: DC
  Filled 2013-11-10: qty 5

## 2013-11-10 MED ORDER — BISACODYL 10 MG RE SUPP: 10.0000 mg | Freq: Once | RECTAL | Status: DC

## 2013-11-10 MED ORDER — IOHEXOL 300 MG/ML  SOLN
75.0000 mL | Freq: Once | INTRAMUSCULAR | Status: AC | PRN
  Administered 2013-11-10: 75 mL via INTRAVENOUS

## 2013-11-10 MED ORDER — LIDOCAINE HCL 1 % IJ SOLN
INTRAMUSCULAR | Status: AC
  Filled 2013-11-10: qty 10

## 2013-11-10 MED ORDER — MIDAZOLAM HCL 2 MG/2ML IJ SOLN
5.0000 mg | Freq: Once | INTRAMUSCULAR | Status: AC
  Administered 2013-11-10: 5 mg via INTRAVENOUS

## 2013-11-10 NOTE — H&P (Signed)
Johnny Oneill is an 73 y.o. male.   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 Ischemic cardiomyopathy     a. 10/09 Echo: Sev LV Dysfxn, inf/lat AK, Mod MR.  [name];  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 Hypertension   . PVC (premature ventricular contraction)   . Pacemaker   . CHF (congestive heart failure)   . Shortness of breath   . Automatic implantable cardioverter-defibrillator in situ     Past Surgical History  Procedure Laterality Date  . Defibrillator  2009  . Total knee arthroplasty  11/27/11    left  . Insert / replace / remove pacemaker  2009  . Coronary angioplasty with stent placement  2004  . Coronary angioplasty with stent placement  07/2008  . Knee arthroscopy w/ partial medial meniscectomy  12/2002    left  . Knee arthroscopy      bilaterally  . Total knee arthroplasty  11/27/2011    Procedure: TOTAL KNEE ARTHROPLASTY;  Surgeon: Yvette Rack., MD;  Location: [facility];  Service: Orthopedics;  Laterality: Left;  left total knee arthroplasty  . Esophagogastroduodenoscopy N/A 09/15/2013    Procedure: ESOPHAGOGASTRODUODENOSCOPY (EGD);  Surgeon: Ladene Artist, MD;  Location: [facility] ENDOSCOPY;  Service: Endoscopy;  Laterality: N/A;  bedside  . Insertion of  implantable left ventricular assist device N/A 09/18/2013    Procedure: INSERTION OF IMPLANTABLE LEFT VENTRICULAR ASSIST DEVICE;  Surgeon: Ivin Poot, MD;  Location: [place];  Service: Open Heart Surgery;  Laterality: N/A;  CIRC ARREST  NITRIC OXIDE  . Intraoperative transesophageal echocardiogram N/A 09/18/2013    Procedure: INTRAOPERATIVE TRANSESOPHAGEAL ECHOCARDIOGRAM;  Surgeon: Ivin Poot, MD;  Location: [place];  Service: Open Heart Surgery;  Laterality: N/A;  . Video assisted thoracoscopy Right 11/06/2013    Procedure: VIDEO ASSISTED THORACOSCOPY;  Surgeon: Gaye Pollack, MD;  Location: [place];  Service: Cardiothoracic;  Laterality: Right;  . Hematoma evacuation Right 11/06/2013    Procedure: EVACUATION HEMATOMA;  Surgeon: Gaye Pollack, MD;  Location: [place];  Service: Cardiothoracic;  Laterality: Right;    Family History  Problem Relation Age of Onset  . Heart failure Father   . Stroke Father   . Coronary artery disease Mother   . Heart disease Mother   . Hyperlipidemia Sister    Social History:  reports that he has never smoked. He has never used smokeless tobacco. He reports that he drinks alcohol. He reports that he does not use illicit drugs.  Allergies:  Allergies  Allergen Reactions  . Diovan [Valsartan] Other (See Comments)    Hypotension at low dose, hyperkalemia  . Lipitor [Atorvastatin Calcium] Other (See Comments)  Muscle pain    Medications Prior to Admission  Medication Sig Dispense Refill  . amiodarone (PACERONE) 200 MG tablet Take 1 tablet (200 mg total) by mouth 2 (two) times daily.  60 tablet  6  . amLODipine (NORVASC) 10 MG tablet Take 1 tablet (10 mg total) by mouth daily.  30 tablet  3  . aspirin 325 MG tablet Take 325 mg by mouth daily.        . cephALEXin (KEFLEX) 500 MG capsule Take 500 mg by mouth 2 (two) times daily. 5 day course filled 11/01/12      . Coenzyme Q10 (COQ10) 200 MG CAPS Take 200 mg by mouth daily.      [name] ezetimibe (ZETIA) 10 MG  tablet Take 10 mg by mouth daily.      . ferrous [id number] C-folic acid (TRINSICON / FOLTRIN) capsule Take 1 capsule by mouth every morning.  30 capsule  3  . ibuprofen (ADVIL,MOTRIN) 200 MG tablet Take 200 mg by mouth daily as needed (pain).      [name] levothyroxine (SYNTHROID, LEVOTHROID) 150 MCG tablet Take 75-150 mcg by mouth daily before breakfast. Take 1 tablet (150 mcg) daily on an empty stomach EXCEPT  take 1/2 tablet (75 mcg) every Friday      . nitroGLYCERIN (NITROSTAT) 0.4 MG SL tablet Place 0.4 mg under the tongue every 5 (five) minutes as needed for chest pain.       . Omega-3 300 MG CAPS Take 300 mg by mouth at bedtime.       . pantoprazole (PROTONIX) 40 MG tablet Take 40 mg by mouth daily.      . rosuvastatin (CRESTOR) 20 MG tablet Take 20 mg by mouth daily.      [name] warfarin (COUMADIN) 5 MG tablet Take 5 mg by mouth daily at 6 PM. Take 1/2 tablet (2.5 mg) on Tues, Thurs, Saturday, take 1 tablet (5 mg) on $[place]'Sunday, Monday, Wednesday, Friday      . clindamycin (CLEOCIN) 150 MG capsule Take 600 mg by mouth once.         Results for orders placed during the hospital encounter of 11/01/13 (from the past 48 hour(s))  GLUCOSE, CAPILLARY     Status: Abnormal   Collection Time    11/08/13 12:07 PM      Result Value Ref Range   Glucose-Capillary 124 (*) 70 - 99 mg/dL   Comment 1 Documented in Chart     Comment 2 Notify RN    GLUCOSE, CAPILLARY     Status: Abnormal   Collection Time    11/08/13  4:30 PM      Result Value Ref Range   Glucose-Capillary 113 (*) 70 - 99 mg/dL   Comment 1 Documented in Chart     Comment 2 Notify RN    APTT     Status: Abnormal   Collection Time    11/08/13  6:00 PM      Result Value Ref Range   aPTT 44 (*) 24 - 37 seconds   Comment:            IF BASELINE aPTT IS ELEVATED,     SUGGEST PATIENT RISK ASSESSMENT     BE USED TO DETERMINE APPROPRIATE     ANTICOAGULANT THERAPY.  BASIC METABOLIC PANEL     Status: Abnormal   Collection Time     03'sgTQd$ /11/15  6:00 PM      Result Value Ref Range   Sodium 140  137 -  147 mEq/L   Potassium 3.5 (*) 3.7 - 5.3 mEq/L   Chloride 103  96 - 112 mEq/L   CO2 26  19 - 32 mEq/L   Glucose, Bld 126 (*) 70 - 99 mg/dL   BUN 27 (*) 6 - 23 mg/dL   Creatinine, Ser 1.25  0.50 - 1.35 mg/dL   Calcium 8.0 (*) 8.4 - 10.5 mg/dL   GFR calc non Af Amer 57 (*) >90 mL/min   GFR calc Af Amer 66 (*) >90 mL/min   Comment: (NOTE)     The eGFR has been calculated using the CKD EPI equation.     This calculation has not been validated in all clinical situations.     eGFR's persistently <90 mL/min signify possible Chronic Kidney     Disease.  GLUCOSE, CAPILLARY     Status: Abnormal   Collection Time    11/08/13  7:21 PM      Result Value Ref Range   Glucose-Capillary 144 (*) 70 - 99 mg/dL   Comment 1 Documented in Chart     Comment 2 Notify RN    GLUCOSE, CAPILLARY     Status: Abnormal   Collection Time    11/08/13 11:35 PM      Result Value Ref Range   Glucose-Capillary 132 (*) 70 - 99 mg/dL   Comment 1 Documented in Chart     Comment 2 Notify RN    GLUCOSE, CAPILLARY     Status: Abnormal   Collection Time    11/09/13  3:31 AM      Result Value Ref Range   Glucose-Capillary 135 (*) 70 - 99 mg/dL   Comment 1 Documented in Chart     Comment 2 Notify RN    COMPREHENSIVE METABOLIC PANEL     Status: Abnormal   Collection Time    11/09/13  4:00 AM      Result Value Ref Range   Sodium 140  137 - 147 mEq/L   Potassium 3.7  3.7 - 5.3 mEq/L   Chloride 102  96 - 112 mEq/L   CO2 25  19 - 32 mEq/L   Glucose, Bld 161 (*) 70 - 99 mg/dL   BUN 26 (*) 6 - 23 mg/dL   Creatinine, Ser 1.20  0.50 - 1.35 mg/dL   Calcium 8.1 (*) 8.4 - 10.5 mg/dL   Total Protein 6.3  6.0 - 8.3 g/dL   Albumin 2.5 (*) 3.5 - 5.2 g/dL   AST 59 (*) 0 - 37 U/L   ALT 145 (*) 0 - 53 U/L   Alkaline Phosphatase 105  39 - 117 U/L   Total Bilirubin 0.9  0.3 - 1.2 mg/dL   GFR calc non Af Amer 60 (*) >90 mL/min   GFR calc Af Amer 69 (*) >90 mL/min    Comment: (NOTE)     The eGFR has been calculated using the CKD EPI equation.     This calculation has not been validated in all clinical situations.     eGFR's persistently <90 mL/min signify possible Chronic Kidney     Disease.  CBC     Status: Abnormal   Collection Time    11/09/13  4:00 AM      Result Value Ref Range   WBC 9.7  4.0 - 10.5 K/uL   RBC 3.25 (*) 4.22 - 5.81 MIL/uL   Hemoglobin 9.6 (*) 13.0 - 17.0 g/dL   HCT 29.0 (*) 39.0 - 52.0 %   MCV  89.2  78.0 - 100.0 fL   MCH 29.5  26.0 - 34.0 pg   MCHC 33.1  30.0 - 36.0 g/dL   RDW 17.0 (*) 11.5 - 15.5 %   Platelets 133 (*) 150 - 400 K/uL  PROTIME-INR     Status: Abnormal   Collection Time    11/09/13  4:00 AM      Result Value Ref Range   Prothrombin Time 16.3 (*) 11.6 - 15.2 seconds   INR 1.34  0.00 - 1.49  MAGNESIUM     Status: None   Collection Time    11/09/13  4:00 AM      Result Value Ref Range   Magnesium 2.0  1.5 - 2.5 mg/dL  PHOSPHORUS     Status: None   Collection Time    11/09/13  4:00 AM      Result Value Ref Range   Phosphorus 2.9  2.3 - 4.6 mg/dL  APTT     Status: Abnormal   Collection Time    11/09/13  4:00 AM      Result Value Ref Range   aPTT 47 (*) 24 - 37 seconds   Comment:            IF BASELINE aPTT IS ELEVATED,     SUGGEST PATIENT RISK ASSESSMENT     BE USED TO DETERMINE APPROPRIATE     ANTICOAGULANT THERAPY.  POCT I-STAT 3, BLOOD GAS (G3+)     Status: Abnormal   Collection Time    11/09/13  4:02 AM      Result Value Ref Range   pH, Arterial 7.402  7.350 - 7.450   pCO2 arterial 42.0  35.0 - 45.0 mmHg   pO2, Arterial 69.0 (*) 80.0 - 100.0 mmHg   Bicarbonate 26.1 (*) 20.0 - 24.0 mEq/L   TCO2 27  0 - 100 mmol/L   O2 Saturation 94.0     Acid-Base Excess 1.0  0.0 - 2.0 mmol/L   Patient temperature 98.4 F     Collection site ARTERIAL LINE     Drawn by Nurse     Sample type ARTERIAL    CARBOXYHEMOGLOBIN     Status: Abnormal   Collection Time    11/09/13  5:36 AM      Result Value Ref  Range   Total hemoglobin 9.4 (*) 13.5 - 18.0 g/dL   O2 Saturation 75.2     Carboxyhemoglobin 2.1 (*) 0.5 - 1.5 %   Methemoglobin 1.7 (*) 0.0 - 1.5 %  GLUCOSE, CAPILLARY     Status: Abnormal   Collection Time    11/09/13  7:36 AM      Result Value Ref Range   Glucose-Capillary 120 (*) 70 - 99 mg/dL   Comment 1 Documented in Chart     Comment 2 Notify RN    GLUCOSE, CAPILLARY     Status: Abnormal   Collection Time    11/09/13 11:43 AM      Result Value Ref Range   Glucose-Capillary 143 (*) 70 - 99 mg/dL   Comment 1 Documented in Chart     Comment 2 Notify RN    GLUCOSE, CAPILLARY     Status: Abnormal   Collection Time    11/09/13  3:29 PM      Result Value Ref Range   Glucose-Capillary 140 (*) 70 - 99 mg/dL   Comment 1 Documented in Chart     Comment 2 Notify RN    [place], TROUGH  Status: None   Collection Time    11/09/13  5:15 PM      Result Value Ref Range   Vancomycin Tr 14.8  10.0 - 20.0 ug/mL  APTT     Status: Abnormal   Collection Time    11/09/13  5:30 PM      Result Value Ref Range   aPTT 43 (*) 24 - 37 seconds   Comment:            IF BASELINE aPTT IS ELEVATED,     SUGGEST PATIENT RISK ASSESSMENT     BE USED TO DETERMINE APPROPRIATE     ANTICOAGULANT THERAPY.  AMYLASE     Status: None   Collection Time    11/09/13  5:30 PM      Result Value Ref Range   Amylase 24  0 - 105 U/L  GLUCOSE, CAPILLARY     Status: Abnormal   Collection Time    11/09/13  7:34 PM      Result Value Ref Range   Glucose-Capillary 133 (*) 70 - 99 mg/dL   Comment 1 Documented in Chart     Comment 2 Notify RN    GLUCOSE, CAPILLARY     Status: Abnormal   Collection Time    11/09/13 11:38 PM      Result Value Ref Range   Glucose-Capillary 121 (*) 70 - 99 mg/dL   Comment 1 Documented in Chart     Comment 2 Notify RN    GLUCOSE, CAPILLARY     Status: Abnormal   Collection Time    11/10/13  3:57 AM      Result Value Ref Range   Glucose-Capillary 135 (*) 70 - 99 mg/dL    Comment 1 Documented in Chart     Comment 2 Notify RN    POCT I-STAT 3, BLOOD GAS (G3+)     Status: Abnormal   Collection Time    11/10/13  4:15 AM      Result Value Ref Range   pH, Arterial 7.395  7.350 - 7.450   pCO2 arterial 46.2 (*) 35.0 - 45.0 mmHg   pO2, Arterial 110.0 (*) 80.0 - 100.0 mmHg   Bicarbonate 28.2 (*) 20.0 - 24.0 mEq/L   TCO2 30  0 - 100 mmol/L   O2 Saturation 98.0     Acid-Base Excess 3.0 (*) 0.0 - 2.0 mmol/L   Patient temperature 37.4 C     Sample type ARTERIAL    CARBOXYHEMOGLOBIN     Status: Abnormal   Collection Time    11/10/13  4:20 AM      Result Value Ref Range   Total hemoglobin 15.5  13.5 - 18.0 g/dL   O2 Saturation 71.1     Carboxyhemoglobin 1.8 (*) 0.5 - 1.5 %   Methemoglobin 1.3  0.0 - 1.5 %  COMPREHENSIVE METABOLIC PANEL     Status: Abnormal   Collection Time    11/10/13  4:25 AM      Result Value Ref Range   Sodium 137  137 - 147 mEq/L   Potassium 3.9  3.7 - 5.3 mEq/L   Chloride 99  96 - 112 mEq/L   CO2 26  19 - 32 mEq/L   Glucose, Bld 143 (*) 70 - 99 mg/dL   BUN 24 (*) 6 - 23 mg/dL   Creatinine, Ser 1.04  0.50 - 1.35 mg/dL   Calcium 8.0 (*) 8.4 - 10.5 mg/dL   Total Protein 5.8 (*) 6.0 -  8.3 g/dL   Albumin 2.3 (*) 3.5 - 5.2 g/dL   AST 32  0 - 37 U/L   ALT 87 (*) 0 - 53 U/L   Alkaline Phosphatase 110  39 - 117 U/L   Total Bilirubin 1.0  0.3 - 1.2 mg/dL   GFR calc non Af Amer 71 (*) >90 mL/min   GFR calc Af Amer 82 (*) >90 mL/min   Comment: (NOTE)     The eGFR has been calculated using the CKD EPI equation.     This calculation has not been validated in all clinical situations.     eGFR's persistently <90 mL/min signify possible Chronic Kidney     Disease.  CBC     Status: Abnormal   Collection Time    11/10/13  4:25 AM      Result Value Ref Range   WBC 7.8  4.0 - 10.5 K/uL   RBC 3.03 (*) 4.22 - 5.81 MIL/uL   Hemoglobin 8.9 (*) 13.0 - 17.0 g/dL   HCT 27.4 (*) 39.0 - 52.0 %   MCV 90.4  78.0 - 100.0 fL   MCH 29.4  26.0 - 34.0 pg    MCHC 32.5  30.0 - 36.0 g/dL   RDW 16.9 (*) 11.5 - 15.5 %   Platelets 149 (*) 150 - 400 K/uL  PROTIME-INR     Status: Abnormal   Collection Time    11/10/13  4:25 AM      Result Value Ref Range   Prothrombin Time 16.0 (*) 11.6 - 15.2 seconds   INR 1.31  0.00 - 1.49  MAGNESIUM     Status: None   Collection Time    11/10/13  4:25 AM      Result Value Ref Range   Magnesium 2.1  1.5 - 2.5 mg/dL  PHOSPHORUS     Status: None   Collection Time    11/10/13  4:25 AM      Result Value Ref Range   Phosphorus 2.5  2.3 - 4.6 mg/dL  APTT     Status: Abnormal   Collection Time    11/10/13  4:25 AM      Result Value Ref Range   aPTT 48 (*) 24 - 37 seconds   Comment:            IF BASELINE aPTT IS ELEVATED,     SUGGEST PATIENT RISK ASSESSMENT     BE USED TO DETERMINE APPROPRIATE     ANTICOAGULANT THERAPY.  GLUCOSE, CAPILLARY     Status: Abnormal   Collection Time    11/10/13  7:28 AM      Result Value Ref Range   Glucose-Capillary 141 (*) 70 - 99 mg/dL   Dg Chest Port 1 View  11/10/2013   CLINICAL DATA:  Ventricular assist device  EXAM: PORTABLE CHEST - 1 VIEW  COMPARISON:  Portable exam 0621 hr compared to 11/09/2013  FINDINGS: Ventricular assist device, left subclavian AICD leads, and right jugular line stable.  Rotated to the left.  Tip of endotracheal tube projects 1.9 cm above carina.  Minimal enlargement of cardiac silhouette.  Persistent opacity at the upper right hemi thorax likely loculated pleural collection.  Additional small pleural effusion at right base.  Mild persistent infiltrate left lower lobe.  No pneumothorax.  Bones demineralized.  IMPRESSION: No interval change.   Electronically Signed   By: Lavonia Dana M.D.   On: 11/10/2013 08:20   Dg Chest Port 1 View  11/09/2013  CLINICAL DATA Status post video-assisted thoracoscopy, evacuation hematoma  EXAM PORTABLE CHEST - 1 VIEW  COMPARISON DG CHEST 1V PORT dated 11/08/2013  FINDINGS ET tube terminates 2.3 cm superior to the  carina. Right IJ central venous catheter tip projects over the superior vena cava. Enteric tube courses inferior to the diaphragm, tip not included on this examination. Single right chest tube remains in place. Multi lead AICD device overlying the left hemi thorax, grossly stable in positioning.  Stable enlarged cardiac and mediastinal contours. LVAD device incompletely evaluated. Persistent homogeneous opacification of the right upper hemi thorax, details of which are largely obscured due to overlying devices. Aeration of the right lower lung and left lung grossly stable from prior. No definite large pneumothorax identified.  IMPRESSION 1. Grossly stable lines and tubes. 2. Grossly stable loculated fluid within the right upper hemi thorax, largely obscured due to overlying devices.  SIGNATURE  Electronically Signed   By: Lovey Newcomer M.D.   On: 11/09/2013 08:00   Dg Abd Portable 1v  11/10/2013   CLINICAL DATA:  Video-assisted thoracoscopy.  Ileus.  EXAM: PORTABLE ABDOMEN - 1 VIEW  COMPARISON:  11/09/2013  FINDINGS: 0624 hrs. No gaseous small bowel distention. Colonic gas has increased in the interval, specially in the transverse segment and in the region of the hepatic flexure. Lucency along the right lateral abdominal wall presumed to represent fat stripe. Contrast noted within the lumen of the appendix.  Right femoral vascular catheters remain in place. Temperature probe overlies the lower midline anatomic pelvis. The tip of the catheter overlying the right upper quadrant is compatible with previously seen chest tube position in the posterior right costophrenic sulcus.  IMPRESSION: Slight interval increase in colonic gas without features to suggest obstruction.   Electronically Signed   By: Misty Stanley M.D.   On: 11/10/2013 08:21   Dg Abd Portable 1v  11/09/2013   CLINICAL DATA Follow-up ileus  EXAM PORTABLE ABDOMEN - 1 VIEW  COMPARISON DG ABD PORTABLE 1V dated 11/08/2013  FINDINGS There is a moderate  amount of stool in the right colon. There is stool and gas in the transverse and descending portions of the colon and in the rectum. The in volume of gas and stool appears has decreased slightly since the previous study. No small bowel obstruction or ileus is demonstrated. The esophagogastric tube tip and proximal port lie in the region of the distal stomach. The ventricular assist device is visible over the upper abdomen. A right-sided chest tube tip lies in the deep posterior medial costophrenic sulcus on the right. There is a femoral vascular catheter whose tip projects over the right sacral ala. There are degenerative changes of the lumbar spine. Partially imaged monitoring devices are present over the lower bony pelvis.  IMPRESSION The volume of stool and gas in the colon has decreased consistent with resolving ileus. There is no evidence of obstruction or free extraluminal gas collections. Support tubes and lines are present.  SIGNATURE  Electronically Signed   By: David  [place]   On: 11/09/2013 08:06    Review of Systems  Constitutional: Negative for fever.    Blood pressure 79/66, pulse 83, temperature 99 F (37.2 C), resp. rate 16, height $RemoveBe'5\' 9"'[id number]$  (1.753 m), weight 72.1 kg (158 lb 15.2 oz), SpO2 100.00%. Physical Exam  Constitutional: He appears well-nourished.  Cardiovascular: Normal rate.   No murmur heard. Respiratory: He is in respiratory distress.  on vent  GI: Soft.  Neurological:  Consented wife via phone  Skin: Skin is warm.     Assessment/Plan Heart failure; CAD LVAD- on transplant list Rt chest tube placed after VATS Rt hemothorax post line placement Now needs Rt pigtail cath per Dr Prescott Gum pts wife aware of procedure benefits and risks and agreeable to proceed Consent signed and in chart   [place] A 11/10/2013, 8:55 AM

## 2013-11-10 NOTE — Progress Notes (Signed)
CSW spoke to wife over the phone regarding a letter for jury duty that CM provided and MD signed. Patient's wife will be coming to the hospital and states she will bring it over to the [place] to provide documentation that patient is in the hospital. [place] signing off at this time. Please re consult if social work needs arise.  Jeanette Caprice, MSW, [place]

## 2013-11-10 NOTE — Progress Notes (Addendum)
NUTRITION FOLLOW UP  INTERVENTION:  Once Panda tube placed, initiate Vital AF 1.2 formula at 10 ml every 8 hours to goal rate of 60 ml/hr to provide 1728 kcals, 108 gm protein, 1168 ml of free water TPN per pharmacy RD to follow for nutrition care plan  NUTRITION DIAGNOSIS: Inadequate oral intake related to inability to eat as evidenced by NPO status, ongoing  Goal: Pt to meet >/= 90% of their estimated nutrition needs, met  Monitor:  TF regimen & tolerance, TPN prescription, respiratory status, weight, labs, I/O's  ASSESSMENT: Patient is s/p LVAD on 09/19/2013; sent to the ED b/c of episodes of nausea vomiting and abdominal pain.   CT of abdomen/pelvis: dilated fluid and gas-filled stomach. Contrast extends into nondilated duodenum. Beyond this region, fluid and gas-filled dilated loops of small bowel consistent with SBO.   Patient s/p procedure 3/9: EMERGENT RIGHT CHEST TUBE PLACEMENT  Patient is currently intubated on ventilator support  MV: 8.9 L/min Temp (24hrs), Avg:99.1 F (37.3 C), Min:98.4 F (36.9 C), Max:99.7 F (37.6 C)   Pt now with ileus.  Plan is re-start TF via post-pyloric feeding tube with simultaneous decompression via OGT.  Patient receiving Clinimix E 5/15 @ 83 ml/hr and lipids @ 10 ml/hr. Provides 1894 kcal and 100 grams protein per day. Meets 100% minimum estimated energy needs and 95% minimum estimated protein needs.  Height: Ht Readings from Last 1 Encounters:  11/06/13 5\' 9"  (1.753 m)    Weight: -----> trending down, + diuresis  Wt Readings from Last 1 Encounters:  11/10/13 158 lb 15.2 oz (72.1 kg)    3/12  154 lb 3/11  160 lb 3/10  171 lb 3/09  151 lb 3/08  151 lb 3/07  156 lb 3/06  156 lb 3/05  151 lb 3/04  151 lb    BMI:  Body mass index is 23.46 kg/(m^2).  Re-estimated needs: Kcal: 1750-1850 Protein: 105-115 gm Fluid: per MD  Skin: Intact  Diet Order: NPO   Intake/Output Summary (Last 24 hours) at 11/10/13 1055 Last  data filed at 11/10/13 1000  Gross per 24 hour  Intake 3382.95 ml  Output   4605 ml  Net -1222.05 ml    Labs:   Recent Labs Lab 11/08/13 0415 11/08/13 1800 11/09/13 0400 11/10/13 0425  NA 137 140 140 137  K 3.4* 3.5* 3.7 3.9  CL 101 103 102 99  CO2 24 26 25 26   BUN 31* 27* 26* 24*  CREATININE 1.26 1.25 1.20 1.04  CALCIUM 8.1* 8.0* 8.1* 8.0*  MG 2.0  --  2.0 2.1  PHOS 3.3  --  2.9 2.5  GLUCOSE 137* 126* 161* 143*    Scheduled Meds: . antiseptic oral rinse  15 mL Mouth Rinse Q4H  . chlorhexidine  15 mL Mouth Rinse BID  . insulin aspart  0-9 Units Subcutaneous 6 times per day  . levothyroxine  100 mcg Intravenous Daily  . metoCLOPramide (REGLAN) injection  10 mg Intravenous 4 times per day  . pantoprazole (PROTONIX) IV  40 mg Intravenous Daily  . piperacillin-tazobactam (ZOSYN)  IV  3.375 g Intravenous 3 times per day  . sodium chloride  10-40 mL Intracatheter Q12H  . vancomycin  750 mg Intravenous Q12H    Continuous Infusions: . dexmedetomidine    . 03/15/15TPN (CLINIMIX-E) Adult 83 mL/hr at 11/09/13 1737   And  . fat emulsion 500 kcal (11/09/13 1738)  . 05/12/15TPN (CLINIMIX-E) Adult     And  . fat emulsion    .  heparin Stopped (11/10/13 1000)  . norepinephrine (LEVOPHED) Adult infusion 7 mcg/min (11/10/13 0430)  .  sodium bicarbonate  infusion 1000 mL Stopped (11/06/13 0930)    Past Medical History  Diagnosis Date  . Ischemic cardiomyopathy     a. 10/09 Echo: Sev LV Dysfxn, inf/lat AK, Mod MR.  [name];  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 Hypertension   . PVC (premature ventricular contraction)   . Pacemaker   . CHF  (congestive heart failure)   . Shortness of breath   . Automatic implantable cardioverter-defibrillator in situ     Past Surgical History  Procedure Laterality Date  . Defibrillator  2009  . Total knee arthroplasty  11/27/11    left  . Insert / replace / remove pacemaker  2009  . Coronary angioplasty with stent placement  2004  . Coronary angioplasty with stent placement  07/2008  . Knee arthroscopy w/ partial medial meniscectomy  12/2002    left  . Knee arthroscopy      bilaterally  . Total knee arthroplasty  11/27/2011    Procedure: TOTAL KNEE ARTHROPLASTY;  Surgeon: Yvette Rack., MD;  Location: [place];  Service: Orthopedics;  Laterality: Left;  left total knee arthroplasty  . Esophagogastroduodenoscopy N/A 09/15/2013    Procedure: ESOPHAGOGASTRODUODENOSCOPY (EGD);  Surgeon: Ladene Artist, MD;  Location: [facility] ENDOSCOPY;  Service: Endoscopy;  Laterality: N/A;  bedside  . Insertion of implantable left ventricular assist device N/A 09/18/2013    Procedure: INSERTION OF IMPLANTABLE LEFT VENTRICULAR ASSIST DEVICE;  Surgeon: Ivin Poot, MD;  Location: [place];  Service: Open Heart Surgery;  Laterality: N/A;  CIRC ARREST  NITRIC OXIDE  . Intraoperative transesophageal echocardiogram N/A 09/18/2013    Procedure: INTRAOPERATIVE TRANSESOPHAGEAL ECHOCARDIOGRAM;  Surgeon: Ivin Poot, MD;  Location: [place];  Service: Open Heart Surgery;  Laterality: N/A;  . Video assisted thoracoscopy Right 11/06/2013    Procedure: VIDEO ASSISTED THORACOSCOPY;  Surgeon: Gaye Pollack, MD;  Location: [place];  Service: Cardiothoracic;  Laterality: Right;  . Hematoma evacuation Right 11/06/2013    Procedure: EVACUATION HEMATOMA;  Surgeon: Gaye Pollack, MD;  Location: [place];  Service: Cardiothoracic;  Laterality: Right;    Arthur Holms, RD, LDN Pager #: [phone] After-Hours Pager #: [phone]

## 2013-11-10 NOTE — Progress Notes (Signed)
4 Days Post-Op Procedure(s) (LRB): VIDEO ASSISTED THORACOSCOPY (Right) EVACUATION HEMATOMA (Right) Subjective:   VDRF for R  Hemothorax, residual posterior component drained in IR with pigtail catheter  NSR, good LVAD fx, no PI events Bronch cultures negative IV heparin resumed post pigtail- goal PTT 50-60 Malnutrition with prealb 10.2  ( severe protein malnutrition)  Post pyloric feeding tube being placed with C-arm  Objective: Vital signs in last 24 hours: Temp:  [98.2 F (36.8 C)-99.5 F (37.5 C)] 98.2 F (36.8 C) (03/13 1544) Pulse Rate:  [40-99] 70 (03/13 1730) Cardiac Rhythm:  [-] Normal sinus rhythm (03/13 1500) Resp:  [11-35] 23 (03/13 1730) BP: (79-110)/(66-77) 79/66 mmHg (03/13 0337) SpO2:  [94 %-100 %] 97 % (03/13 1730) Arterial Line BP: (70-131)/(56-107) 123/94 mmHg (03/13 1730) FiO2 (%):  [40 %] 40 % (03/13 1555) Weight:  [158 lb 15.2 oz (72.1 kg)] 158 lb 15.2 oz (72.1 kg) (03/13 0500)  Hemodynamic parameters for last 24 hours: stable  Intake/Output from previous day: 03/12 0701 - 03/13 0700 In: 3568.6 [P.O.:30; I.V.:526.6; NG/GT:150; IV Piggyback:600; [id number] Out: [id number]; Emesis/NG output:500; Stool:850; Chest Tube:60] Intake/Output this shift: Total I/O In: 1235.8 [I.V.:212.8; TPN:1023] Out: 2425 [Urine:2425]  Sedated on vent abd mildly distended extrem warm  Lab Results:  Recent Labs  11/09/13 0400 11/10/13 0425  WBC 9.7 7.8  HGB 9.6* 8.9*  HCT 29.0* 27.4*  PLT 133* 149*   BMET:  Recent Labs  11/09/13 0400 11/10/13 0425  NA 140 137  K 3.7 3.9  CL 102 99  CO2 25 26  GLUCOSE 161* 143*  BUN 26* 24*  CREATININE 1.20 1.04  CALCIUM 8.1* 8.0*    PT/INR:  Recent Labs  11/10/13 0425  LABPROT 16.0*  INR 1.31   ABG    Component Value Date/Time   PHART 7.395 11/10/2013 0415   HCO3 28.2* 11/10/2013 0415   TCO2 30 11/10/2013 0415   ACIDBASEDEF 1.0 11/07/2013 1138   O2SAT 71.1 11/10/2013 0420   CBG (last 3)   Recent Labs  11/10/13 0728 11/10/13 1135 11/10/13 1547  GLUCAP 141* 136* 142*    Assessment/Plan: S/P Procedure(s) (LRB): VIDEO ASSISTED THORACOSCOPY (Right) EVACUATION HEMATOMA (Right)   Drain R loculated effusion and diurese , then wean vent IV heparin-- resume oral coumadin when bowel fx improves   LOS: 9 days    Johnny Oneill,Johnny Oneill 11/10/2013

## 2013-11-10 NOTE — Sedation Documentation (Signed)
Pt transported up to room with 2 RNs, RT,  and Lvad coodinator. Pt on vent and precedex gtt.

## 2013-11-10 NOTE — Progress Notes (Signed)
Advanced Heart Failure Team Rounding Note     Subjective:    [name] is a 73 year old patient with a history of CAD and HF s/p LVAD HMII 09/19/2013 currently [facility] Transplant list .   Admitted to [facility] 09/13/13 through 10/03/13. Initially admitted with decompenstaed heart failure and low output. Required IABP for stabilization. He ultimately had LVAD HM II placed on 09/19/13. He suffered from RV failure after the procedure and required milrinone for >7 days which was gradually weaned off. D/C weight 151 pounds. f  Admitted on 3/4 with SBO/ileus. Was improving then had cardiac arrest last night (3/8) with PEA in setting of R hemothorax. Now s/p R chest tube and multiple units transfusion. Underwent R VATs 3/9 then bronch on 3/10.   Remains intubated. Failed vent wean again yesterday. Remains on levophed. CXR with persistent diffuse RUL opacity. KUB with ileus.  Co-ox 71%. INR 1.3 On Heparin.  I/Os -2.4L but weight up. Lasix now on hold. Remains on TPN. CVP 23  LVAD INTERROGATION:  HeartMate II LVAD: Flow 5.0 liters/min, speed 9400, power 5.5, PI 5.7 No PI events  I reviewed the LVAD parameters from today, and compared the results to the patient's prior recorded data. No programming changes were made. The LVAD is functioning within specified parameters. The patient performs LVAD self-test daily. LVAD interrogation was negative for any significant power changes, alarms or PI events/speed drops. LVAD equipment check completed and is in good working order. Back-up equipment present. LVAD education done on emergency procedures and precautions and reviewed exit site care.   Physical Exam: GENERAL: intubated. sedated HEENT: normal  +ET & NGT NECK: Supple CARDIAC: Mechanical heart sounds with LVAD hum present.  LUNGS: R chest tube.. Decreased BS on R.  ABDOMEN: +distended, nontender,  Hypoactive bowel sounds LVAD exit site: well-healed and incorporated. Dressing dry and intact. No erythema or drainage.  Stabilization device present and accurately applied. Driveline dressing is being changed daily per sterile technique.  EXTREMITIES: Warm and dry, no cyanosis, clubbing, rash or edema  NEUROLOGIC: Intubated sedated  Telemetry: SR   Labs: Basic Metabolic Panel:  Recent Labs Lab 11/07/13 0400 11/08/13 0415 11/08/13 1800 11/09/13 0400 11/10/13 0425  NA 138 137 140 140 137  K 4.1 3.4* 3.5* 3.7 3.9  CL 102 101 103 102 99  CO2 21 24 26 25 26   GLUCOSE 97 137* 126* 161* 143*  BUN 33* 31* 27* 26* 24*  CREATININE 1.22 1.26 1.25 1.20 1.04  CALCIUM 8.4 8.1* 8.0* 8.1* 8.0*  MG 1.7 2.0  --  2.0 2.1  PHOS 3.4 3.3  --  2.9 2.5    Liver Function Tests:  Recent Labs Lab 11/06/13 0855 11/07/13 0400 11/08/13 0415 11/09/13 0400 11/10/13 0425  AST 490* 259* 131* 59* 32  ALT 312* 233* 196* 145* 87*  ALKPHOS 89 81 104 105 110  BILITOT 1.1 1.4* 0.9 0.9 1.0  PROT 5.8* 5.6* 5.7* 6.3 5.8*  ALBUMIN 2.6* 2.8* 2.5* 2.5* 2.3*    Recent Labs Lab 11/09/13 1730  AMYLASE 24   No results found for this basename: AMMONIA,  in the last 168 hours  CBC:  Recent Labs Lab 11/07/13 0400 11/07/13 1600 11/08/13 0415 11/09/13 0400 11/10/13 0425  WBC 7.2 11.1* 10.2 9.7 7.8  HGB 9.1* 9.4* 9.0* 9.6* 8.9*  HCT 25.6* 27.0* 27.1* 29.0* 27.4*  MCV 84.8 86.3 87.7 89.2 90.4  PLT 80* 100* 112* 133* 149*    Cardiac Enzymes: No results found for this basename: CKTOTAL, CKMB,  CKMBINDEX, TROPONINI,  in the last 168 hours  BNP: BNP (last 3 results)  Recent Labs  09/13/13 1349 09/25/13 0400 11/01/13 1033  PROBNP 3883.0* 7019.0* 877.6*    CBG:  Recent Labs Lab 11/09/13 1143 11/09/13 1529 11/09/13 1934 11/09/13 2338 11/10/13 0357  GLUCAP 143* 140* 133* 121* 135*    Coagulation Studies:  Recent Labs  11/08/13 0415 11/09/13 0400 11/10/13 0425  LABPROT 19.0* 16.3* 16.0*  INR 1.64* 1.34 1.31    Other results:   Imaging: Dg Chest Port 1 View  11/09/2013   CLINICAL DATA Status  post video-assisted thoracoscopy, evacuation hematoma  EXAM PORTABLE CHEST - 1 VIEW  COMPARISON DG CHEST 1V PORT dated 11/08/2013  FINDINGS ET tube terminates 2.3 cm superior to the carina. Right IJ central venous catheter tip projects over the superior vena cava. Enteric tube courses inferior to the diaphragm, tip not included on this examination. Single right chest tube remains in place. Multi lead AICD device overlying the left hemi thorax, grossly stable in positioning.  Stable enlarged cardiac and mediastinal contours. LVAD device incompletely evaluated. Persistent homogeneous opacification of the right upper hemi thorax, details of which are largely obscured due to overlying devices. Aeration of the right lower lung and left lung grossly stable from prior. No definite large pneumothorax identified.  IMPRESSION 1. Grossly stable lines and tubes. 2. Grossly stable loculated fluid within the right upper hemi thorax, largely obscured due to overlying devices.  SIGNATURE  Electronically Signed   By: Lovey Newcomer M.D.   On: 11/09/2013 08:00   Dg Abd Portable 1v  11/09/2013   CLINICAL DATA Follow-up ileus  EXAM PORTABLE ABDOMEN - 1 VIEW  COMPARISON DG ABD PORTABLE 1V dated 11/08/2013  FINDINGS There is a moderate amount of stool in the right colon. There is stool and gas in the transverse and descending portions of the colon and in the rectum. The in volume of gas and stool appears has decreased slightly since the previous study. No small bowel obstruction or ileus is demonstrated. The esophagogastric tube tip and proximal port lie in the region of the distal stomach. The ventricular assist device is visible over the upper abdomen. A right-sided chest tube tip lies in the deep posterior medial costophrenic sulcus on the right. There is a femoral vascular catheter whose tip projects over the right sacral ala. There are degenerative changes of the lumbar spine. Partially imaged monitoring devices are present over the  lower bony pelvis.  IMPRESSION The volume of stool and gas in the colon has decreased consistent with resolving ileus. There is no evidence of obstruction or free extraluminal gas collections. Support tubes and lines are present.  SIGNATURE  Electronically Signed   By: David  Martinique   On: 11/09/2013 08:06     Assessment:   1. Hemorrhagic shock due to R hemothorax    --s/p R chest tube 3/8 2. 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 CAD 4. Hypothyroidism 5. SBO 6. Acute blood loss anemia 7. PEA arrest 8. Shock liver 9. A/C renal failure, stage III 10. Ventilatory-dependent respiratory failure 11. Hypokalemia  Plan/Discussion:    No real improvement overnight.  Remains intubated. Failed wean again yesterday. CXR with persistent diffuse RUL opacity. CT drainage minimal. D/w Dr. Prescott Gum. Will get CT chest this am. Wean vent as tolerated but doubt he will extubate today.  Remains on levophed. He is very volume sensitive in setting of known RV failure. CVP 23. Will give 40 IV lasix.  Keep CVP 10-14 range. Wean levophed as tolerated.   Ileus persists. No evidence dead gut. On TPN. Panda place today.   Hgb stable. On heparin/coumadin. INR 1.3  VAD parameters look good.  The patient is critically ill with multiple organ systems failure and requires high complexity decision making for assessment and support, frequent evaluation and titration of therapies, application of advanced monitoring technologies and extensive interpretation of multiple databases.   Critical Care Time devoted to patient care services described in this note is 45 Minutes.  Length of Stay: 9 Glori Bickers MD 11/10/2013, 7:49 AM  Advanced Heart Failure Team Pager [phone] (M-F; [place])  Please contact [facility] for night-coverage after hours (4p -7a ) and weekends on [email or web address]

## 2013-11-10 NOTE — Sedation Documentation (Signed)
Pt arrived on lvad with coordinator. Rt present. Iv precedex running

## 2013-11-10 NOTE — Progress Notes (Signed)
PULMONARY  / CRITICAL CARE MEDICINE  Name: Johnny Oneill MRN: [id number] DOB: Jan 31, 1941    ADMISSION DATE:  11/01/2013 CONSULTATION DATE:  3/8  PRIMARY SERVICE: Cardiolgoy  CHIEF COMPLAINT:  Cardiogenic Shock  BRIEF PATIENT DESCRIPTION: 14 male with LVAD implanted 09/19/2013 with onset of shock, likely due to hemorrhage, necessitating intubation  SIGNIFICANT EVENTS / STUDIES:  3/8 CXR with with complete opacification of right hemothorax 3/9 improvement of right hemothorax s/p chest tube.  ET tube in place 3/9 CT Chest abd pelvis Endotracheal tube in grossly good position. Ventricular assistance device and left-sided pacemaker are noted. Right-sided chest tube has been placed with distal tip directed toward posterior portion of chest. Large right pleural effusion is noted with high density material layering within it concerning for hemorrhage  LINES / TUBES: R [place] CVL 3/5 Right Femoral CVL 3/5>>>3/10 Right Femoral A line 3/5>>>  CULTURES: BAL 3/10>>>NTD Sputum 3/9>>>GPC in pairs. Blood 3/8>>>NTD  ANTIBIOTICS: Zosyn 3/5>>> Vancomcyin 3/5>>>  SUBJECTIVE: For a pig tail placement on right chest today for a hemothorax, no events overnight.  VITAL SIGNS: Temp:  [98.4 F (36.9 C)-99.7 F (37.6 C)] 99 F (37.2 C) (03/13 0723) Pulse Rate:  [40-99] 85 (03/13 0900) Resp:  [12-35] 14 (03/13 0900) BP: (79-110)/(66-77) 79/66 mmHg (03/13 0337) SpO2:  [92 %-100 %] 96 % (03/13 0900) Arterial Line BP: (68-120)/(54-95) 83/74 mmHg (03/13 0900) FiO2 (%):  [40 %] 40 % (03/13 0828) Weight:  [158 lb 15.2 oz (72.1 kg)] 158 lb 15.2 oz (72.1 kg) (03/13 0500)  HEMODYNAMICS: CVP:  [6 mmHg-24 mmHg] 24 mmHg  VENTILATOR SETTINGS: Vent Mode:  [-] PRVC FiO2 (%):  [40 %] 40 % Set Rate:  [14 bmp] 14 bmp Vt Set:  [500 mL] 500 mL PEEP:  [5 cmH20] 5 cmH20 Pressure Support:  [10 cmH20] 10 cmH20 Plateau Pressure:  [6 cmH20-21 cmH20] 6 cmH20  INTAKE / OUTPUT: Intake/Output     03/12 0701 - 03/13  0700 03/13 0701 - 03/14 0700   P.O. 30    I.V. (mL/kg) 526.6 (7.3) 38.8 (0.5)   Other 30    NG/GT 150    IV Piggyback 600    TPN 2232 186   Total Intake(mL/kg) 3568.6 (49.5) 224.8 (3.1)   Urine (mL/kg/hr) 4555 (2.6)    Emesis/NG output 500 (0.3)    Stool 850 (0.5)    Chest Tube 60 (0)    Total Output 5965     Net -2396.4 +224.8        Stool Occurrence 1 x     PHYSICAL EXAMINATION: General: Chronically ill appearing male, sedated. Neuro: Sedated and intubated, moves all ext to pain. HEENT: [place]/AT, PERRL, EOMI, pale conjunctiva. Cardiovascular: RRR, Nl S1/S2, -M/R/G. Lungs: Coarse BS diffusely. Abdomen: Soft, NT, ND and +BS. Musculoskeletal:  No lower extremity edema, unable to palpate pulses Skin:  Pale, no ecchymosis, no evidence of retropertoneal bleed  LABS:  Recent Labs Lab 11/05/13 2122  11/06/13 0156  11/06/13 0500  11/06/13 0535  11/08/13 0415 11/08/13 0419 11/08/13 1800 11/09/13 0400 11/09/13 0402 11/09/13 1730 11/10/13 0415 11/10/13 0425  HGB  --   < >  --   --  11.4*  --   --   < > 9.0*  --   --  9.6*  --   --   --  8.9*  WBC  --   < >  --   --  17.5*  --   --   < > 10.2  --   --  9.7  --   --   --  7.8  PLT  --   < >  --   --  131*  --   --   < > 112*  --   --  133*  --   --   --  149*  NA  --   < >  --   --  135*  --   --   < > 137  --  140 140  --   --   --  137  K  --   < >  --   --  3.9  --   --   < > 3.4*  --  3.5* 3.7  --   --   --  3.9  CL  --   < >  --   --  97  --   --   < > 101  --  103 102  --   --   --  99  CO2  --   < >  --   --  21  --   --   < > 24  --  26 25  --   --   --  26  GLUCOSE  --   < >  --   --  169*  --   --   < > 137*  --  126* 161*  --   --   --  143*  BUN  --   < >  --   --  31*  --   --   < > 31*  --  27* 26*  --   --   --  24*  CREATININE  --   < >  --   --  1.51*  --   --   < > 1.26  --  1.25 1.20  --   --   --  1.04  CALCIUM  --   < >  --   --  8.4  --   --   < > 8.1*  --  8.0* 8.1*  --   --   --  8.0*  MG  --   --   --    --   --   --   --   < > 2.0  --   --  2.0  --   --   --  2.1  PHOS  --   --   --   --   --   --   --   < > 3.3  --   --  2.9  --   --   --  2.5  AST  --   < >  --   --  570*  --   --   < > 131*  --   --  59*  --   --   --  32  ALT  --   < >  --   --  332*  --   --   < > 196*  --   --  145*  --   --   --  87*  ALKPHOS  --   < >  --   --  95  --   --   < > 104  --   --  105  --   --   --  110  BILITOT  --   < >  --   --  1.4*  --   --   < > 0.9  --   --  0.9  --   --   --  1.0  PROT  --   < >  --   --  5.8*  --   --   < > 5.7*  --   --  6.3  --   --   --  5.8*  ALBUMIN  --   < >  --   --  2.6*  --   --   < > 2.5*  --   --  2.5*  --   --   --  2.3*  APTT  --   --   --   --   --   --   --   < > 48*  --  44* 47*  --  43*  --  48*  INR  --   < >  --   --  1.59*  --   --   < > 1.64*  --   --  1.34  --   --   --  1.31  LATICACIDVEN 3.2*  --  5.7*  --   --   --   --   --   --   --   --   --   --   --   --   --   PROCALCITON  --   --   --   --   --   --  2.81  --   --   --   --   --   --   --   --   --   PHART  --   < >  --   < >  --   < >  --   < >  --  7.432  --   --  7.402  --  7.395  --   PCO2ART  --   < >  --   < >  --   < >  --   < >  --  39.6  --   --  42.0  --  46.2*  --   PO2ART  --   < >  --   < >  --   < >  --   < >  --  101.0*  --   --  69.0*  --  110.0*  --   < > = values in this interval not displayed.  Recent Labs Lab 11/09/13 1529 11/09/13 1934 11/09/13 2338 11/10/13 0357 11/10/13 0728  GLUCAP 140* 133* 121* 135* 141*    CXR: Patient with right sided chest tube in place, ET tube in place, cardiomegaly, right subclavian line in place, noted right hemi opacity (improvment)  CT chest Complicated effusion, multiple denisties with layering consistent with blood in right thorax.  No gas pockets in fluid collection  ASSESSMENT / PLAN:  PULMONARY A:  Respiratory Failure Due to Cardiogenic/Hemorrhagic shock  P:   - Maintain PEEP at 5 and FiO2 at 40%. - CXR and ABG in AM. - PS  trials again today once more awake. - Will need further diureses before serious consideration for extubation.  CARDIOVASCULAR A: Cardiogenic/Hemorrhagic Shock LVAD in place functioning appropriately with good flow Norepinephrine requirements trending downward with vasopressin supplementation (does not constrict pulmonary vasculature in a patient with known RVHF) Off levo overnight. P:  - LVAD in place. - Levo 7 mcg. - Lasix as ordered.  RENAL A:  AKI Acute renal failure Pre-Renal from hemorrhagic shock/Cardiogenic Shock.  Improving.  P: - Renal dose medications. - D/Ced Bicarb drip. - Maintain hemodynamics as able to preserve renal function. - Lasix 40 mg IV x1 as ordered. - Replace K as needed.  GASTROINTESTINAL A:  Stress Ulcer Prophylaxis.  Ileus. P:   - Protonix in place - TPN for ileus. - Post pyloric panda today and an OGT, hopefully can start TF post pyloric today.  HEMATOLOGIC A:  Hemorrhagic shock Transfusion with FFP, partial reversal of anticoagulation and holding coumadin (risk of bleeding vs LVAD circuit clotting) P:  - Daily CBC.  INFECTIOUS A:  Possible sepsis Multiple sites of possible infection with CVL, LVAD, Urine, Lung with persistent opacity (although likely just evolving bleed) P:   - Zosyn/Vanc as ordered. - Maintain on vanc/zosyn until GPC in pairs are identified.  ENDOCRINE A:  Glucose control   P:   - SSI in place  NEUROLOGIC A:  Hx of Delerium P:   - Versed and fentanyl drips as ordered, will attempt to minimize, patient is completely unresponsive this AM and drips are on hold for >2 hours thus far.  - Will change to pushes of fentanyl and precedex, d/c versed.  I have personally obtained a history, examined the patient, evaluated laboratory and imaging results, formulated the assessment and plan and placed orders.  CRITICAL CARE: The patient is critically ill with multiple organ systems failure and requires high complexity  decision making for assessment and support, frequent evaluation and titration of therapies, application of advanced monitoring technologies and extensive interpretation of multiple databases. Critical Care Time devoted to patient care services described in this note is 35 minutes.   Rush Farmer, M.D. [facility] Pulmonary/Critical Care Medicine. Pager: [phone]. After hours pager: [phone].

## 2013-11-10 NOTE — Progress Notes (Signed)
PARENTERAL NUTRITION CONSULT NOTE: F/U  Pharmacy Consult:  TPN Indication: Ileus  Allergies  Allergen Reactions  . Diovan [Valsartan] Other (See Comments)    Hypotension at low dose, hyperkalemia  . Lipitor [Atorvastatin Calcium] Other (See Comments)    Muscle pain    Patient Measurements: Height: 5\' 9"  (175.3 cm) Weight: 158 lb 15.2 oz (72.1 kg) IBW/kg (Calculated) : 70.7 Usual Weight: 68 kg  Vital Signs: Temp: 99 F (37.2 C) (03/13 0723) Temp src: Core (Comment) (03/13 0400) BP: 79/66 mmHg (03/13 0337) Pulse Rate: 83 (03/13 0731) Intake/Output from previous day: 03/12 0701 - 03/13 0700 In: 3568.6 [P.O.:30; I.V.:526.6; NG/GT:150; IV Piggyback:600; TPN:2232] Out: 5965 [id number]; Emesis/NG output:500; Stool:850; Chest Tube:60]  Labs:  Recent Labs  11/08/13 0415  11/09/13 0400 11/09/13 1730 11/10/13 0425  WBC 10.2  --  9.7  --  7.8  HGB 9.0*  --  9.6*  --  8.9*  HCT 27.1*  --  29.0*  --  27.4*  PLT 112*  --  133*  --  149*  APTT 48*  < > 47* 43* 48*  INR 1.64*  --  1.34  --  1.31  < > = values in this interval not displayed.   Recent Labs  11/07/13 1110  11/08/13 0415 11/08/13 1800 11/09/13 0400 11/10/13 0425  NA  --   < > 137 140 140 137  K  --   < > 3.4* 3.5* 3.7 3.9  CL  --   < > 101 103 102 99  CO2  --   < > 24 26 25 26   GLUCOSE  --   < > 137* 126* 161* 143*  BUN  --   < > 31* 27* 26* 24*  CREATININE  --   < > 1.26 1.25 1.20 1.04  CALCIUM  --   < > 8.1* 8.0* 8.1* 8.0*  MG  --   --  2.0  --  2.0 2.1  PHOS  --   --  3.3  --  2.9 2.5  PROT  --   --  5.7*  --  6.3 5.8*  ALBUMIN  --   --  2.5*  --  2.5* 2.3*  AST  --   --  131*  --  59* 32  ALT  --   --  196*  --  145* 87*  ALKPHOS  --   --  104  --  105 110  BILITOT  --   --  0.9  --  0.9 1.0  PREALBUMIN 10.4*  --  8.7*  --   --   --   TRIG 117  --  112  --   --   --   < > = values in this interval not displayed. Estimated Creatinine Clearance: 66.1 ml/min (by C-G formula based on Cr of 1.04).     Recent Labs  11/09/13 1934 11/09/13 2338 11/10/13 0357  GLUCAP 133* 121* 135*     Insulin Requirements in the past 24 hours:  4 unit sensitive SSI/24h with CBGs 120-140/24h  Assessment: 29 YOM with history of recent LVAD and discharged on 09/19/13.  Patient returned with N/V and CT scan revealed SBO.   GI:  SBO believed to have resolved. Had flatus and several BMs. TPN d/c'd 3/7. Resumed TPN 3/10 for inability to take po's/TFs and high NG output . Emesis/NG=500 Pt removed NG tube and refused to have further attempts to replace. LBM 3/12.. Probably ileus Prealbumin 10.4. IV Reglan  QID, IV PPI  Endo: hypothyroid on Synthroid (dose increased).  No hx DM - CBGs controlled on SSI  Lytes: K 3.9 Phos 2.5 Mag 2.1  Renal: Scr 1.04 UOP 2.7  Cards: CAD s/p stent to LCX 2004 with re-occlusion of LCX and lateral MI 2009 and repeat stenting, total occlusion of RCA, ICM, chronic sHF, s/p LVAD 09/19/13 -Cardiac arrest 3/8 with PEA with R hemothorax. Cardiogenic/hemorrhagic shock. 3/9: right VATS and evacuation of hematoma-hemothorax. Levophed drip  Hepatobil: Shock liver s/p PEA arrest 3/8. Tbili 1.0 with elevated LFTs improving. INR 1.31  Heme: Hgb 8.9. Plts improved to 149  Resp: VDRF. Has R-sided chest tube and large R pleural effusion concerning for hemorrhage (on 3/9 CT). Fent/Versed drips. FiO140%. Chest tube with 40 output.  ID: Vanco/Zosyn #9. WBc 7.8 Tc=99.7  Anticoag: LVAD. Coumadin PTA with INR 3.5 upon admit. Reversed. Heparin drip for LVAD low dose at 77ml/hr per MD. INR down to 1.31 from shock liver.  Best Practices: PPI IV, MC,  TPN Access: CVC placed 11/02/13  TPN day#: 3/5-3/8, resume 3/10  Current Nutrition:  TPN at goal 91ml/hr = 1894kcal and 99.6g protein  Nutritional Goals:  1750-1850 kcal and 105-115g protein per RD note 3/10.   Plan:  - Increase Clinimix E to 32ml/hr. - Daily IV multivitamin in TPN - Trace elements on M/W/F due to national shortage  Excell Seltzer, PharmD Clinical Staff Pharmacist Pager [phone]  11/10/2013, 7:50 AM

## 2013-11-10 NOTE — Progress Notes (Signed)
Pt returned from IR.  Pigtail placed and placed to [place] with -20 suction.  No air leak present.  Advised pt tolerated the procedure well.  Molly of the VAD Team had accompanied the pt to radiology.  Pt had no events during procedure and transport.

## 2013-11-10 NOTE — Progress Notes (Signed)
Pt has NGT and Panda placed by xray.  Chest and abdominal xray will be obtained to confirm placement.  Pt did have increasing agitation after returning to the unit from IR.  Wife was at the bedside during this time and was concerned that the pt may attempt to pull out the ETT.  When the xray tech began the procedure to place the panda, the pt became very agitated trying to pull his ETT and biting his ETT.  Pt was given fent. X2 and precedex was infusing at max. Rate.  The pt continued to be agitated. Restraints were initiated at this time by order from Dr. Joya Gaskins.  Pt continued to be agitated and 5mg  of versed was admin.  Pt did calm and tolerated procedure.    Pt had no events during the shift.   MAPs were within goal with levophed infusing at 32mcg/kg/min.  CVP was 10.    CXR and abdominal xray will be performed to determine if tube feedings will be started.

## 2013-11-10 NOTE — Progress Notes (Signed)
Patient found to have removed NG tube.  An attempt was made unsuccessfully to replace NG tube, pt refusing further attempts.  Will delay ahead of scheduled feeding tube placement today.

## 2013-11-10 NOTE — Procedures (Signed)
Interventional Radiology Procedure Note  Procedure: Placement of 72F drain into right apical pleural collection.  100 mL thick, dark old blood aspirated.  Sample sent for cx.  Tube connected to water seal.  Complications: None Recommendations: - Tube to wall suction, if drainage is sub-optimal, recommend TPA dwells (3-4 mg tPA in 10-20 mL saline instilled into tube, then clamp tube x 1 hr.  After 1 hr, re-connect to suction) every 12 hrs x 3.     Signed,  Criselda Peaches, MD [facility]

## 2013-11-10 NOTE — Progress Notes (Signed)
28mL fentanyl and 27mL versed wasted in sink, flushed, witnessed by Marcelyn Ditty, RN.

## 2013-11-10 NOTE — Progress Notes (Signed)
IR called and ordered to turn heparin off. Heparin stopped.

## 2013-11-11 ENCOUNTER — Inpatient Hospital Stay (HOSPITAL_COMMUNITY): Payer: Medicare Other

## 2013-11-11 LAB — POCT I-STAT 3, ART BLOOD GAS (G3+)
Acid-Base Excess: 3 mmol/L — ABNORMAL HIGH (ref 0.0–2.0)
Bicarbonate: 28.2 mEq/L — ABNORMAL HIGH (ref 20.0–24.0)
O2 Saturation: 95 %
TCO2: 30 mmol/L (ref 0–100)
pCO2 arterial: 46.1 mmHg — ABNORMAL HIGH (ref 35.0–45.0)
pH, Arterial: 7.394 (ref 7.350–7.450)
pO2, Arterial: 75 mmHg — ABNORMAL LOW (ref 80.0–100.0)

## 2013-11-11 LAB — COMPREHENSIVE METABOLIC PANEL
ALT: 64 U/L — ABNORMAL HIGH (ref 0–53)
AST: 28 U/L (ref 0–37)
Albumin: 2.3 g/dL — ABNORMAL LOW (ref 3.5–5.2)
Alkaline Phosphatase: 129 U/L — ABNORMAL HIGH (ref 39–117)
BUN: 20 mg/dL (ref 6–23)
CO2: 26 mEq/L (ref 19–32)
Calcium: 8.1 mg/dL — ABNORMAL LOW (ref 8.4–10.5)
Chloride: 100 mEq/L (ref 96–112)
Creatinine, Ser: 0.88 mg/dL (ref 0.50–1.35)
GFR calc Af Amer: >90 mL/min (ref >90)
GFR calc non Af Amer: 85 mL/min — ABNORMAL LOW (ref >90)
Glucose, Bld: 144 mg/dL — ABNORMAL HIGH (ref 70–99)
Potassium: 3.6 mEq/L — ABNORMAL LOW (ref 3.7–5.3)
Sodium: 137 mEq/L (ref 137–147)
Total Bilirubin: 1.2 mg/dL (ref 0.3–1.2)
Total Protein: 6 g/dL (ref 6.0–8.3)

## 2013-11-11 LAB — GLUCOSE, CAPILLARY
Glucose-Capillary: 107 mg/dL — ABNORMAL HIGH (ref 70–99)
Glucose-Capillary: 129 mg/dL — ABNORMAL HIGH (ref 70–99)
Glucose-Capillary: 138 mg/dL — ABNORMAL HIGH (ref 70–99)
Glucose-Capillary: 148 mg/dL — ABNORMAL HIGH (ref 70–99)
Glucose-Capillary: 150 mg/dL — ABNORMAL HIGH (ref 70–99)
Glucose-Capillary: 166 mg/dL — ABNORMAL HIGH (ref 70–99)

## 2013-11-11 LAB — PHOSPHORUS: Phosphorus: 2.5 mg/dL (ref 2.3–4.6)

## 2013-11-11 LAB — CBC
HCT: 28.1 % — ABNORMAL LOW (ref 39.0–52.0)
Hemoglobin: 9.1 g/dL — ABNORMAL LOW (ref 13.0–17.0)
MCH: 29.2 pg (ref 26.0–34.0)
MCHC: 32.4 g/dL (ref 30.0–36.0)
MCV: 90.1 fL (ref 78.0–100.0)
Platelets: 143 10*3/uL — ABNORMAL LOW (ref 150–400)
RBC: 3.12 MIL/uL — ABNORMAL LOW (ref 4.22–5.81)
RDW: 16.5 % — ABNORMAL HIGH (ref 11.5–15.5)
WBC: 6.3 10*3/uL (ref 4.0–10.5)

## 2013-11-11 LAB — APTT
aPTT: 51 seconds — ABNORMAL HIGH (ref 24–37)
aPTT: 52 seconds — ABNORMAL HIGH (ref 24–37)

## 2013-11-11 LAB — CARBOXYHEMOGLOBIN
Carboxyhemoglobin: 1.8 % — ABNORMAL HIGH (ref 0.5–1.5)
Methemoglobin: 0.7 % (ref 0.0–1.5)
O2 Saturation: 67.8 %
Total hemoglobin: 8.6 g/dL — ABNORMAL LOW (ref 13.5–18.0)

## 2013-11-11 LAB — MAGNESIUM: Magnesium: 2.1 mg/dL (ref 1.5–2.5)

## 2013-11-11 LAB — PROTIME-INR
INR: 1.19 (ref 0.00–1.49)
Prothrombin Time: 14.8 seconds (ref 11.6–15.2)

## 2013-11-11 MED ORDER — POTASSIUM CHLORIDE 10 MEQ/50ML IV SOLN
10.0000 meq | INTRAVENOUS | Status: AC
  Administered 2013-11-11 (×2): 10 meq via INTRAVENOUS
  Filled 2013-11-11 (×2): qty 50

## 2013-11-11 MED ORDER — M.V.I. ADULT IV INJ
INTRAVENOUS | Status: AC
  Administered 2013-11-11: 18:00:00 via INTRAVENOUS
  Filled 2013-11-11: qty 2000

## 2013-11-11 MED ORDER — FLEET ENEMA 7-19 GM/118ML RE ENEM
1.0000 | ENEMA | Freq: Once | RECTAL | Status: AC
  Administered 2013-11-11: 1 via RECTAL
  Filled 2013-11-11: qty 1

## 2013-11-11 MED ORDER — FUROSEMIDE 10 MG/ML IJ SOLN
20.0000 mg | Freq: Once | INTRAMUSCULAR | Status: AC
  Administered 2013-11-11: 20 mg via INTRAVENOUS
  Filled 2013-11-11: qty 2

## 2013-11-11 MED ORDER — SODIUM CHLORIDE 0.9 % IV SOLN
INTRAVENOUS | Status: DC
  Administered 2013-11-11 (×2): via INTRAVENOUS

## 2013-11-11 MED ORDER — FAT EMULSION 20 % IV EMUL
250.0000 mL | INTRAVENOUS | Status: AC
  Administered 2013-11-11: 250 mL via INTRAVENOUS
  Filled 2013-11-11: qty 250

## 2013-11-11 MED ORDER — BISACODYL 10 MG RE SUPP
10.0000 mg | Freq: Once | RECTAL | Status: AC
  Administered 2013-11-11: 10 mg via RECTAL
  Filled 2013-11-11: qty 1

## 2013-11-11 NOTE — Progress Notes (Signed)
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 The patient had a right subclavian triple-lumen catheter which has been removed since the bleeding occurred causing the tension hemothorax . he is currently supported with a right IJ and a right femoral venous catheter. Femoral  A-line has been placed for 6 days  Patient developed cardiac arrest from right hemo-thorax tension on right heart and has been intubated with ventilator-dependent respiratory failure for 6 days. The chest x-ray shows significant clearing after the procedures and drainage tubes were placed. His bronchial washings are negative and white count normal  The patient's LVAD function has been normal since evacuation of right hemothorax and he is maintained sinus rhythm. He is on heparin infusion while he is n.p.o.  The partial small bowel obstruction is resolved he currently has ileus. Attempt at placing feeding tube by interventional radiology was not successful for postpyloric positioning. The patient currently has a gastric ileus with retained contrast still in the stomach from the tube insertion  The patient is receiving systemic sedation and has required low-dose norepinephrine to maintain adequate mean arterial pressure.  Echocardiogram performed since admission shows no pericardial effusion, adequate RV function and no AI with good decompression of LV by LVAD  LVAD INTERROGATION HeartMate II LVAD:  Flow 4.0 liters/min, speed 9400 RPM, power 5.0, PI 4.5  No PI events   Objective:    Vital Signs:   Temp:  [97.4 F (36.3 C)-98.7 F (37.1 C)] 97.4 F (36.3 C) (03/14 0751) Pulse Rate:  [32-93] 58 (03/14 1030) Resp:  [11-28]  15 (03/14 1030) BP: (78-112)/(63-84) 78/63 mmHg (03/14 0331) SpO2:  [95 %-100 %] 98 % (03/14 1030) Arterial Line BP: (66-137)/(52-107) 73/56 mmHg (03/14 1030) FiO2 (%):  [40 %] 40 % (03/14 0810) Weight:  [157 lb 6.5 oz (71.4 kg)] 157 lb 6.5 oz (71.4 kg) (03/14 0500) Last BM Date: 11/09/13 Mean arterial Pressure  82 Intake/Output:   Intake/Output Summary (Last 24 hours) at 11/11/13 1041 Last data filed at 11/11/13 1000  Gross per 24 hour  Intake 3247.13 ml  Output   3910 ml  Net -662.87 ml     Physical Exam: General:  Well appearing.sedated on ventilator  HEENT: normal Neck: supple. JVP . Carotids 2+ bilat; no bruits. No lymphadenopathy or thryomegaly appreciated. Cor: Mechanical heart sounds with LVAD hum present. Lungs: clear Abdomen: soft, nontender, mildly distended. No hepatosplenomegaly. No bruits or masses. diminished  bowel sounds. Driveline: C/D/I; securement device intact and driveline incorporated Extremities: no cyanosis, clubbing, rash, mild edema, extremities warm  Neuro: sedated on ventilator but does respond to command Telemetry: sinus rhythm  Labs: Basic Metabolic Panel:  Recent Labs Lab 11/07/13 0400 11/08/13 0415 11/08/13 1800 11/09/13 0400 11/10/13 0425 11/11/13 0408  NA 138 137 140 140 137 137  K 4.1 3.4* 3.5* 3.7 3.9 3.6*  CL 102 101 103 102 99 100  CO2 21 24 26 25 26 26   GLUCOSE 97 137* 126* 161* 143* 144*  BUN 33* 31* 27* 26* 24* 20  CREATININE 1.22 1.26 1.25 1.20 1.04 0.88  CALCIUM 8.4 8.1* 8.0* 8.1* 8.0* 8.1*  MG 1.7 2.0  --  2.0 2.1 2.1  PHOS 3.4 3.3  --  2.9 2.5 2.5    Liver Function Tests:  Recent Labs Lab 11/07/13 0400 11/08/13 0415 11/09/13 0400 11/10/13 0425 11/11/13 0408  AST 259* 131* 59* 32 28  ALT 233* 196* 145* 87* 64*  ALKPHOS 81 104 105 110 129*  BILITOT 1.4* 0.9 0.9 1.0 1.2  PROT 5.6* 5.7* 6.3 5.8* 6.0  ALBUMIN 2.8* 2.5* 2.5* 2.3* 2.3*    Recent Labs Lab 11/09/13 1730  AMYLASE 24   No results found for  this basename: AMMONIA,  in the last 168 hours  CBC:  Recent Labs Lab 11/07/13 1600 11/08/13 0415 11/09/13 0400 11/10/13 0425 11/11/13 0408  WBC 11.1* 10.2 9.7 7.8 6.3  HGB 9.4* 9.0* 9.6* 8.9* 9.1*  HCT 27.0* 27.1* 29.0* 27.4* 28.1*  MCV 86.3 87.7 89.2 90.4 90.1  PLT 100* 112* 133* 149* 143*    INR:  Recent Labs Lab 11/07/13 0400 11/08/13 0415 11/09/13 0400 11/10/13 0425 11/11/13 0408  INR 2.24* 1.64* 1.34 1.31 1.19    Other results:  EKG:   Imaging: Dg Abd 1 View  11/10/2013   CLINICAL DATA:  Feeding tube placement  EXAM: ABDOMEN - 1 VIEW  COMPARISON:  DG ABD PORTABLE 1V dated 11/09/2013; DG ABD PORTABLE 1V dated 11/10/2013; DG ABD PORTABLE 1V dated 11/10/2013; CT IMAGE GUIDED DRAINAGE BY PERCUTANEOUS CATHETER dated 11/10/2013  FINDINGS: Two intra procedural fluoroscopic images were obtained as part of feeding tube placement by the technologist. These images demonstrate incomplete visualization of median sternotomy wires and pacer wires as well as a left ventricular assist device. The feeding tube is partly obscured by the left ventricular assist device, with contrast opacifying the stomach.  FLUOROSCOPY TIME:  6 min 51 seconds  IMPRESSION: Feeding tube placement with tip apparently within the stomach, although contrast is partly obscured by the left ventricular assist device and findings would be better confirmed at abdominal radiography. Abdominal radiographs were subsequently performed and reported separately on 11/10/2013. This exam from 11/09/2013 was presented for evaluation on 11/10/2013. Please see reports of subsequent imaging for follow-up.   Electronically Signed   By: Conchita Paris M.D.   On: 11/10/2013 19:52   Dg Chest Port 1 View  11/11/2013   CLINICAL DATA:  Check endotracheal tube position  EXAM: PORTABLE CHEST - 1 VIEW  COMPARISON:  11/10/2013  FINDINGS: Cardiac shadow is stable. A ventricular assist device is again identified and stable. A defibrillator is  again noted stable. There is decreasing density overlying the mid and upper right lung consistent with the previous the placed drainage catheter. Some kinking of the catheter is noted. Left lung is clear. An endotracheal tube is noted 2 points 6 cm above the carina and stable in appearance. A nasogastric catheter is been removed and apparent feeding catheter is now seen coursing into the stomach. Its tip is obscured by the ventricular assist device. A right-sided jugular central line is seen. A right-sided chest tube is again seen and stable.  IMPRESSION: Decrease in the right-sided pleural fluid secondary to catheter placement.  Tubes and lines as described.  No new focal abnormality is seen.   Electronically Signed   By: Inez Catalina M.D.   On: 11/11/2013 07:16   Dg Chest Port 1 View  11/10/2013   CLINICAL DATA:  Right chest tube.  EXAM: PORTABLE CHEST - 1 VIEW  COMPARISON:  Earlier today.  FINDINGS: The right chest tube is unchanged. Stable right upper loculated pleural fluid. Decreased pleural fluid at the right lung base. Stable enlarged cardiac silhouette, median sternotomy wires and left subclavian AICD and pacer  leads. Stable left ventricular assist device. The left lung remains clear. The endotracheal tube is in satisfactory position. Nasogastric tube extending into the mid to upper esophagus and curved back upon itself with its tip not included.  IMPRESSION: 1. Malpositioned nasogastric tube with its tip most likely in the oropharynx or nasopharynx. 2. Stable cardiomegaly and loculated right pleural fluid. 3. Decreased non loculated right pleural fluid.   Electronically Signed   By: Enrique Sack M.D.   On: 11/10/2013 20:32   Dg Chest Port 1 View  11/10/2013   CLINICAL DATA:  Ventricular assist device  EXAM: PORTABLE CHEST - 1 VIEW  COMPARISON:  Portable exam 0621 hr compared to 11/09/2013  FINDINGS: Ventricular assist device, left subclavian AICD leads, and right jugular line stable.  Rotated to the  left.  Tip of endotracheal tube projects 1.9 cm above carina.  Minimal enlargement of cardiac silhouette.  Persistent opacity at the upper right hemi thorax likely loculated pleural collection.  Additional small pleural effusion at right base.  Mild persistent infiltrate left lower lobe.  No pneumothorax.  Bones demineralized.  IMPRESSION: No interval change.   Electronically Signed   By: Lavonia Dana M.D.   On: 11/10/2013 08:20   Dg Abd Portable 1v  11/11/2013   CLINICAL DATA:  Small bowel obstruction  EXAM: PORTABLE ABDOMEN - 1 VIEW  COMPARISON:  Abdomen radiograph 11/10/2013 and chest radiograph 11/11/2013  FINDINGS: Oral contrast material is seen in the visualized portion of the stomach, within nondilated small bowel loops, and throughout the majority of the colon. There is mild diffuse distention of the colon. Oral contrast is seen in the rectum. There are no dilated small bowel loops. Distal tip of a right-sided chest tube projects over the right upper quadrant. Left ventricular assist device projects over the left upper quadrant. Right femoral catheter terminates air S1 to the right of midline. Rectal thermistor place.  IMPRESSION: Oral contrast is seen throughout the bowel to the level of the colon. No evidence of bowel obstruction. There is mild distention of the colon consistent with ileus.   Electronically Signed   By: Curlene Dolphin M.D.   On: 11/11/2013 09:49   Dg Abd Portable 1v  11/10/2013   CLINICAL DATA:  Feeding tube and nasogastric tube placement.  EXAM: PORTABLE ABDOMEN - 1 VIEW  COMPARISON:  Portable chest obtained at the same time.  FINDINGS: Previously described malpositioned nasogastric tube curved back upon itself in the mid esophagus. The tip is not included. Feeding tube extending to the gastroesophageal junction with its tip obscured by the left ventricular assist device. Stable right chest tube with its tip overlying the mid liver on the right. Normal bowel gas pattern. Right femoral  catheter tip in the region of the right common femoral vein. Splenic artery calcifications without aneurysm. Lower lumbar spine degenerative changes.  IMPRESSION: 1. Previously described malpositioned nasogastric tube with its tip most likely in the oropharynx or nasopharynx. 2. Feeding tube extending to gastroesophageal junction with the tube tip obscured by the left ventricular assist device. 3. Right chest tube tip overlying the right mid liver.   Electronically Signed   By: Enrique Sack M.D.   On: 11/10/2013 20:37   Dg Abd Portable 1v  11/10/2013   CLINICAL DATA:  OG tube placement  EXAM: PORTABLE ABDOMEN - 1 VIEW  COMPARISON:  11/10/2013  FINDINGS: OG catheter tip projects over the right upper quadrant and could be within a right lower lobe bronchus. Left ventricular assist  device partially imaged. Nonobstructive bowel gas pattern. Transverse colon is air distended. Right femoral central line noted. Degenerative changes of the spine.  IMPRESSION: OG catheter tip projects in the right upper quadrant, possibly within a right lower lobe bronchus  These results were called by telephone at the time of interpretation on 11/10/2013 at 9:02 AM to [facility], [place], who verbally acknowledged these results.   Electronically Signed   By: Daryll Brod M.D.   On: 11/10/2013 09:04   Dg Abd Portable 1v  11/10/2013   CLINICAL DATA:  Video-assisted thoracoscopy.  Ileus.  EXAM: PORTABLE ABDOMEN - 1 VIEW  COMPARISON:  11/09/2013  FINDINGS: 0624 hrs. No gaseous small bowel distention. Colonic gas has increased in the interval, specially in the transverse segment and in the region of the hepatic flexure. Lucency along the right lateral abdominal wall presumed to represent fat stripe. Contrast noted within the lumen of the appendix.  Right femoral vascular catheters remain in place. Temperature probe overlies the lower midline anatomic pelvis. The tip of the catheter overlying the right upper quadrant is compatible with previously  seen chest tube position in the posterior right costophrenic sulcus.  IMPRESSION: Slight interval increase in colonic gas without features to suggest obstruction.   Electronically Signed   By: Misty Stanley M.D.   On: 11/10/2013 08:21   Dg Addison Bailey G Tube Plc W/fl-no Rad  11/10/2013   CLINICAL DATA: need post pyloric feeding tube for malnutrition   NASO G TUBE PLACEMENT WITH FLUORO  Fluoroscopy was utilized by the requesting physician.  No radiographic  interpretation.    Ct Image Guided Drainage By Percutaneous Catheter  11/10/2013   ADDENDUM REPORT: 11/10/2013 17:11  ADDENDUM: An initial CT scan of the chest with contrast material was performed in the left lateral decubitus position to fully evaluate the complex right fluid collection an to serve as planning for the percutaneous intervention.  There is a complex loculated fluid collection at the right lung apex with layering hematocrit levels most suggestive of evolving hemothorax. There is a persistent small left pleural effusion. A basally directed right thoracostomy tube is in good position. There is minimal residual fluid in all inferior aspect of the lung base. No pulmonary edema. Atelectasis and likely an element of pleural parenchymal scarring is noted in the inferior lingula and left lower lobe. There is diffuse mild bronchial wall thickening. The heart is enlarged. A left ventricular assist device is present. No acute abnormality involving the device. The endotracheal tube is in good position. A right IJ central venous catheter terminates at the superior cavoatrial junction left subclavian approach cardiac rhythm maintenance device with leads in the right atrium and right ventricular apex.  The visualized upper thorax is unremarkable. No acute osseous abnormality.   Electronically Signed   By: Jacqulynn Cadet M.D.   On: 11/10/2013 17:11   11/10/2013   CLINICAL DATA:  73 year old male with loculated right hemothorax. He underwent prior VATS with  removal of blood from the mid and inferior pleural space. However, there is loculated fluid at the apex of the lung and the lung is tethered. CT-guided placement of a percutaneous drain is warranted in effort to remove the loculated blood products.  EXAM: CT GUIDED placement of a right-sided thoracostomy tube  ANESTHESIA/SEDATION: Precedex from ICU. In additional 60 mcg of fentanyl was administered intravenously.  Total monitored sedation time was 20 min.  PROCEDURE: The procedure, risks, benefits, and alternatives were explained to the patient. Questions regarding the procedure were  encouraged and answered. The patient understands and consents to the procedure.  The patient was placed in the left lateral decubitus position. A CT scan of the chest with contrast material was performed. There is a loculated heterogeneous attenuation fluid in in the right lung apex partially compressing the right upper lobe. The appearance of the fluid is most consistent with evolving hemo thorax. A suitable skin entry site was selected and marked. Local anesthesia was attained by infiltration with 1% lidocaine. Under intermittent CT fluoroscopic guidance, a needle was directed over rib and into the apex of the pleural space.  Approximately 100 mL of thick, dark reddish brown bloody fluid was removed. The contents are consistent with old hematoma. A sample was selected and sent for culture. The tube was secured to the skin with 0 silk suture. An air tight gauze dressing was placed. The tube was connected to a Pleur-Evac at 20 cm water.  COMPLICATIONS: None immediate  FINDINGS: Loculated hematoma at the lung apex.  IMPRESSION: Successful placement of a 73 F Cook all-purpose drain yielding 100 mL of thick, dark old blood.  Recommend maintaining the tube to low wall suction. If there is insufficient drainage over the next 24 hr, we will consider serial TPA dwells in an effort to break up the residual blood products and facilitate  drainage.  Signed,  Criselda Peaches, MD  [facility]  [facility]  Electronically Signed: By: Jacqulynn Cadet M.D. On: 11/10/2013 15:17      Medications:     Scheduled Medications: . antiseptic oral rinse  15 mL Mouth Rinse Q4H  . bisacodyl  10 mg Rectal Once  . chlorhexidine  15 mL Mouth Rinse BID  . insulin aspart  0-9 Units Subcutaneous 6 times per day  . levothyroxine  100 mcg Intravenous Daily  . metoCLOPramide (REGLAN) injection  10 mg Intravenous 4 times per day  . pantoprazole (PROTONIX) IV  40 mg Intravenous Daily  . piperacillin-tazobactam (ZOSYN)  IV  3.375 g Intravenous 3 times per day  . vancomycin  750 mg Intravenous Q12H     Infusions: . sodium chloride 20 mL/hr at 11/11/13 0925  . dexmedetomidine 0.999 mcg/kg/hr (11/11/13 1023)  . Marland KitchenTPN (CLINIMIX-E) Adult 83 mL/hr at 11/10/13 2030   And  . fat emulsion 250 mL (11/10/13 2029)  . Marland KitchenTPN (CLINIMIX-E) Adult     And  . fat emulsion    . feeding supplement (VITAL AF 1.2 CAL)    . heparin 1,100 Units/hr (11/11/13 0911)  . norepinephrine (LEVOPHED) Adult infusion 2.987 mcg/min (11/11/13 0926)     PRN Medications:  diphenhydrAMINE, fentaNYL, LORazepam, ondansetron (ZOFRAN) IV, phenol, sodium chloride   Assessment:   ventilator-dependent respiratory failure, chest x-ray is improved will proceed with spontaneous breathing test, weaning  Ileus with inadequate feeding tube position, continue T. NA, enema ordered and continue Reglan  Systemic anticoagulation with heparin, goal PTT 50-60  Continue systemic antibiotics complete 7-10 days for possible pneumonia Plan/Discussion:      I reviewed the LVAD parameters from today, and compared the results to the patient's prior recorded data.  No programming changes were made.  The LVAD is functioning within specified parameters.  The patient performs LVAD self-test daily.  LVAD interrogation was negative for any  significant power changes, alarms or PI events/speed drops.  LVAD equipment check completed and is in good working order.  Back-up equipment present.   LVAD education done on emergency procedures and precautions and reviewed exit site  care.  Length of Stay: 10  VAN TRIGT III,Sayana Salley 11/11/2013, 10:41 AM  VAD Team Pager [phone] (7am - 7am)

## 2013-11-11 NOTE — Progress Notes (Signed)
5 Days Post-Op  Subjective: New Rt chest tube placed at apex 3/13   Objective: Vital signs in last 24 hours: Temp:  [97.4 F (36.3 C)-98.7 F (37.1 C)] 98 F (36.7 C) (03/14 1218) Pulse Rate:  [32-93] 59 (03/14 1200) Resp:  [11-28] 14 (03/14 1200) BP: (78-112)/(63-84) 78/63 mmHg (03/14 0331) SpO2:  [95 %-100 %] 98 % (03/14 1200) Arterial Line BP: (66-137)/(52-107) 77/60 mmHg (03/14 1200) FiO2 (%):  [40 %] 40 % (03/14 1206) Weight:  [71.4 kg (157 lb 6.5 oz)] 71.4 kg (157 lb 6.5 oz) (03/14 0500) Last BM Date: 11/09/13  Intake/Output from previous day: 03/13 0701 - 03/14 0700 In: 3078.2 [I.V.:611.7; NG/GT:90; IV Piggyback:450; TPN:1926.5] Out: 4225 [Urine:4125; Chest Tube:100] Intake/Output this shift: Total I/O In: 802 [I.V.:222; NG/GT:40; IV Piggyback:75; TPN:465] Out: 295 [Urine:295]  PE:  Afeb; vss 100 cc bloody output in pleurvac Site clean and dry H/H stable  Lab Results:   Recent Labs  11/10/13 0425 11/11/13 0408  WBC 7.8 6.3  HGB 8.9* 9.1*  HCT 27.4* 28.1*  PLT 149* 143*   BMET  Recent Labs  11/10/13 0425 11/11/13 0408  NA 137 137  K 3.9 3.6*  CL 99 100  CO2 26 26  GLUCOSE 143* 144*  BUN 24* 20  CREATININE 1.04 0.88  CALCIUM 8.0* 8.1*   PT/INR  Recent Labs  11/10/13 0425 11/11/13 0408  LABPROT 16.0* 14.8  INR 1.31 1.19   ABG  Recent Labs  11/10/13 0415 11/11/13 0411  PHART 7.395 7.394  HCO3 28.2* 28.2*    Studies/Results: Dg Abd 1 View  11/10/2013   CLINICAL DATA:  Feeding tube placement  EXAM: ABDOMEN - 1 VIEW  COMPARISON:  DG ABD PORTABLE 1V dated 11/09/2013; DG ABD PORTABLE 1V dated 11/10/2013; DG ABD PORTABLE 1V dated 11/10/2013; CT IMAGE GUIDED DRAINAGE BY PERCUTANEOUS CATHETER dated 11/10/2013  FINDINGS: Two intra procedural fluoroscopic images were obtained as part of feeding tube placement by the technologist. These images demonstrate incomplete visualization of median sternotomy wires and pacer wires as well as a left  ventricular assist device. The feeding tube is partly obscured by the left ventricular assist device, with contrast opacifying the stomach.  FLUOROSCOPY TIME:  6 min 51 seconds  IMPRESSION: Feeding tube placement with tip apparently within the stomach, although contrast is partly obscured by the left ventricular assist device and findings would be better confirmed at abdominal radiography. Abdominal radiographs were subsequently performed and reported separately on 11/10/2013. This exam from 11/09/2013 was presented for evaluation on 11/10/2013. Please see reports of subsequent imaging for follow-up.   Electronically Signed   By: Conchita Paris M.D.   On: 11/10/2013 19:52   Dg Chest Port 1 View  11/11/2013   CLINICAL DATA:  Check endotracheal tube position  EXAM: PORTABLE CHEST - 1 VIEW  COMPARISON:  11/10/2013  FINDINGS: Cardiac shadow is stable. A ventricular assist device is again identified and stable. A defibrillator is again noted stable. There is decreasing density overlying the mid and upper right lung consistent with the previous the placed drainage catheter. Some kinking of the catheter is noted. Left lung is clear. An endotracheal tube is noted 2 points 6 cm above the carina and stable in appearance. A nasogastric catheter is been removed and apparent feeding catheter is now seen coursing into the stomach. Its tip is obscured by the ventricular assist device. A right-sided jugular central line is seen. A right-sided chest tube is again seen and stable.  IMPRESSION: Decrease in  the right-sided pleural fluid secondary to catheter placement.  Tubes and lines as described.  No new focal abnormality is seen.   Electronically Signed   By: Inez Catalina M.D.   On: 11/11/2013 07:16   Dg Chest Port 1 View  11/10/2013   CLINICAL DATA:  Right chest tube.  EXAM: PORTABLE CHEST - 1 VIEW  COMPARISON:  Earlier today.  FINDINGS: The right chest tube is unchanged. Stable right upper loculated pleural fluid.  Decreased pleural fluid at the right lung base. Stable enlarged cardiac silhouette, median sternotomy wires and left subclavian AICD and pacer leads. Stable left ventricular assist device. The left lung remains clear. The endotracheal tube is in satisfactory position. Nasogastric tube extending into the mid to upper esophagus and curved back upon itself with its tip not included.  IMPRESSION: 1. Malpositioned nasogastric tube with its tip most likely in the oropharynx or nasopharynx. 2. Stable cardiomegaly and loculated right pleural fluid. 3. Decreased non loculated right pleural fluid.   Electronically Signed   By: Enrique Sack M.D.   On: 11/10/2013 20:32   Dg Chest Port 1 View  11/10/2013   CLINICAL DATA:  Ventricular assist device  EXAM: PORTABLE CHEST - 1 VIEW  COMPARISON:  Portable exam 0621 hr compared to 11/09/2013  FINDINGS: Ventricular assist device, left subclavian AICD leads, and right jugular line stable.  Rotated to the left.  Tip of endotracheal tube projects 1.9 cm above carina.  Minimal enlargement of cardiac silhouette.  Persistent opacity at the upper right hemi thorax likely loculated pleural collection.  Additional small pleural effusion at right base.  Mild persistent infiltrate left lower lobe.  No pneumothorax.  Bones demineralized.  IMPRESSION: No interval change.   Electronically Signed   By: Lavonia Dana M.D.   On: 11/10/2013 08:20   Dg Abd Portable 1v  11/11/2013   CLINICAL DATA:  Small bowel obstruction  EXAM: PORTABLE ABDOMEN - 1 VIEW  COMPARISON:  Abdomen radiograph 11/10/2013 and chest radiograph 11/11/2013  FINDINGS: Oral contrast material is seen in the visualized portion of the stomach, within nondilated small bowel loops, and throughout the majority of the colon. There is mild diffuse distention of the colon. Oral contrast is seen in the rectum. There are no dilated small bowel loops. Distal tip of a right-sided chest tube projects over the right upper quadrant. Left  ventricular assist device projects over the left upper quadrant. Right femoral catheter terminates air S1 to the right of midline. Rectal thermistor place.  IMPRESSION: Oral contrast is seen throughout the bowel to the level of the colon. No evidence of bowel obstruction. There is mild distention of the colon consistent with ileus.   Electronically Signed   By: Curlene Dolphin M.D.   On: 11/11/2013 09:49   Dg Abd Portable 1v  11/10/2013   CLINICAL DATA:  Feeding tube and nasogastric tube placement.  EXAM: PORTABLE ABDOMEN - 1 VIEW  COMPARISON:  Portable chest obtained at the same time.  FINDINGS: Previously described malpositioned nasogastric tube curved back upon itself in the mid esophagus. The tip is not included. Feeding tube extending to the gastroesophageal junction with its tip obscured by the left ventricular assist device. Stable right chest tube with its tip overlying the mid liver on the right. Normal bowel gas pattern. Right femoral catheter tip in the region of the right common femoral vein. Splenic artery calcifications without aneurysm. Lower lumbar spine degenerative changes.  IMPRESSION: 1. Previously described malpositioned nasogastric tube with its tip  most likely in the oropharynx or nasopharynx. 2. Feeding tube extending to gastroesophageal junction with the tube tip obscured by the left ventricular assist device. 3. Right chest tube tip overlying the right mid liver.   Electronically Signed   By: Enrique Sack M.D.   On: 11/10/2013 20:37   Dg Abd Portable 1v  11/10/2013   CLINICAL DATA:  OG tube placement  EXAM: PORTABLE ABDOMEN - 1 VIEW  COMPARISON:  11/10/2013  FINDINGS: OG catheter tip projects over the right upper quadrant and could be within a right lower lobe bronchus. Left ventricular assist device partially imaged. Nonobstructive bowel gas pattern. Transverse colon is air distended. Right femoral central line noted. Degenerative changes of the spine.  IMPRESSION: OG catheter tip  projects in the right upper quadrant, possibly within a right lower lobe bronchus  These results were called by telephone at the time of interpretation on 11/10/2013 at 9:02 AM to [facility], [place], who verbally acknowledged these results.   Electronically Signed   By: Daryll Brod M.D.   On: 11/10/2013 09:04   Dg Abd Portable 1v  11/10/2013   CLINICAL DATA:  Video-assisted thoracoscopy.  Ileus.  EXAM: PORTABLE ABDOMEN - 1 VIEW  COMPARISON:  11/09/2013  FINDINGS: 0624 hrs. No gaseous small bowel distention. Colonic gas has increased in the interval, specially in the transverse segment and in the region of the hepatic flexure. Lucency along the right lateral abdominal wall presumed to represent fat stripe. Contrast noted within the lumen of the appendix.  Right femoral vascular catheters remain in place. Temperature probe overlies the lower midline anatomic pelvis. The tip of the catheter overlying the right upper quadrant is compatible with previously seen chest tube position in the posterior right costophrenic sulcus.  IMPRESSION: Slight interval increase in colonic gas without features to suggest obstruction.   Electronically Signed   By: Misty Stanley M.D.   On: 11/10/2013 08:21   Dg Addison Bailey G Tube Plc W/fl-no Rad  11/10/2013   CLINICAL DATA: need post pyloric feeding tube for malnutrition   NASO G TUBE PLACEMENT WITH FLUORO  Fluoroscopy was utilized by the requesting physician.  No radiographic  interpretation.    Ct Image Guided Drainage By Percutaneous Catheter  11/10/2013   ADDENDUM REPORT: 11/10/2013 17:11  ADDENDUM: An initial CT scan of the chest with contrast material was performed in the left lateral decubitus position to fully evaluate the complex right fluid collection an to serve as planning for the percutaneous intervention.  There is a complex loculated fluid collection at the right lung apex with layering hematocrit levels most suggestive of evolving hemothorax. There is a persistent small left  pleural effusion. A basally directed right thoracostomy tube is in good position. There is minimal residual fluid in all inferior aspect of the lung base. No pulmonary edema. Atelectasis and likely an element of pleural parenchymal scarring is noted in the inferior lingula and left lower lobe. There is diffuse mild bronchial wall thickening. The heart is enlarged. A left ventricular assist device is present. No acute abnormality involving the device. The endotracheal tube is in good position. A right IJ central venous catheter terminates at the superior cavoatrial junction left subclavian approach cardiac rhythm maintenance device with leads in the right atrium and right ventricular apex.  The visualized upper thorax is unremarkable. No acute osseous abnormality.   Electronically Signed   By: Jacqulynn Cadet M.D.   On: 11/10/2013 17:11   11/10/2013   CLINICAL DATA:  73 year old male  with loculated right hemothorax. He underwent prior VATS with removal of blood from the mid and inferior pleural space. However, there is loculated fluid at the apex of the lung and the lung is tethered. CT-guided placement of a percutaneous drain is warranted in effort to remove the loculated blood products.  EXAM: CT GUIDED placement of a right-sided thoracostomy tube  ANESTHESIA/SEDATION: Precedex from ICU. In additional 60 mcg of fentanyl was administered intravenously.  Total monitored sedation time was 20 min.  PROCEDURE: The procedure, risks, benefits, and alternatives were explained to the patient. Questions regarding the procedure were encouraged and answered. The patient understands and consents to the procedure.  The patient was placed in the left lateral decubitus position. A CT scan of the chest with contrast material was performed. There is a loculated heterogeneous attenuation fluid in in the right lung apex partially compressing the right upper lobe. The appearance of the fluid is most consistent with evolving hemo  thorax. A suitable skin entry site was selected and marked. Local anesthesia was attained by infiltration with 1% lidocaine. Under intermittent CT fluoroscopic guidance, a needle was directed over rib and into the apex of the pleural space.  Approximately 100 mL of thick, dark reddish brown bloody fluid was removed. The contents are consistent with old hematoma. A sample was selected and sent for culture. The tube was secured to the skin with 0 silk suture. An air tight gauze dressing was placed. The tube was connected to a Pleur-Evac at 20 cm water.  COMPLICATIONS: None immediate  FINDINGS: Loculated hematoma at the lung apex.  IMPRESSION: Successful placement of a 8 F Cook all-purpose drain yielding 100 mL of thick, dark old blood.  Recommend maintaining the tube to low wall suction. If there is insufficient drainage over the next 24 hr, we will consider serial TPA dwells in an effort to break up the residual blood products and facilitate drainage.  Signed,  Criselda Peaches, MD  [facility]  [facility]  Electronically Signed: By: Jacqulynn Cadet M.D. On: 11/10/2013 15:17    Anti-infectives: Anti-infectives   Start     Dose/Rate Route Frequency Ordered Stop   11/07/13 1800  vancomycin (VANCOCIN) IVPB 750 mg/150 ml premix     750 mg 150 mL/hr over 60 Minutes Intravenous Every 12 hours 11/07/13 0727     11/06/13 1130  vancomycin (VANCOCIN) 1,250 mg in sodium chloride 0.9 % 250 mL IVPB  Status:  Discontinued     1,250 mg 166.7 mL/hr over 90 Minutes Intravenous To Surgery 11/06/13 1119 11/06/13 1433   11/06/13 1130  cefUROXime (ZINACEF) 1.5 g in dextrose 5 % 50 mL IVPB  Status:  Discontinued     1.5 g 100 mL/hr over 30 Minutes Intravenous To Surgery 11/06/13 1119 11/06/13 1433   11/06/13 1130  cefUROXime (ZINACEF) 750 mg in dextrose 5 % 50 mL IVPB  Status:  Discontinued     750 mg 100 mL/hr over 30 Minutes Intravenous To Surgery 11/06/13 1117  11/06/13 1433   11/06/13 0900  cefUROXime (ZINACEF) 1.5 g in dextrose 5 % 50 mL IVPB     1.5 g 100 mL/hr over 30 Minutes Intravenous 60 min pre-op 11/06/13 0831 11/06/13 1224   11/05/13 2300  vancomycin (VANCOCIN) IVPB 1000 mg/200 mL premix  Status:  Discontinued     1,000 mg 200 mL/hr over 60 Minutes Intravenous Every 24 hours 11/05/13 2221 11/07/13 0727   11/05/13 2300  piperacillin-tazobactam (ZOSYN) IVPB 3.375 g  3.375 g 12.5 mL/hr over 240 Minutes Intravenous 3 times per day 11/05/13 2221     11/02/13 0130  cefTRIAXone (ROCEPHIN) 1 g in dextrose 5 % 50 mL IVPB  Status:  Discontinued     1 g 100 mL/hr over 30 Minutes Intravenous Every 24 hours 11/02/13 0121 11/05/13 2209   11/01/13 2200  cephALEXin (KEFLEX) capsule 500 mg  Status:  Discontinued     500 mg Oral 2 times daily 11/01/13 2157 11/02/13 0121      Assessment/Plan: s/p Procedure(s): VIDEO ASSISTED THORACOSCOPY (Right) EVACUATION HEMATOMA (Right)  Rt lung apex chest tube placed 3/13 100cc withdrawn at procedure; 100 cc in pleurvac Will follw May need to consider tpa injection Will report to Dr Laurence Ferrari   LOS: 10 days    Anivea Velasques A 11/11/2013

## 2013-11-11 NOTE — Progress Notes (Signed)
Advanced Heart Failure Team Rounding Note     Subjective:    Johnny Oneill is a 73 year old patient with a history of CAD and HF s/p LVAD HMII 09/19/2013 currently [facility] Transplant list .   Admitted to [facility] 09/13/13 through 10/03/13. Initially admitted with decompenstaed heart failure and low output. Required IABP for stabilization. He ultimately had LVAD HM II placed on 09/19/13. He suffered from RV failure after the procedure and required milrinone for >7 days which was gradually weaned off. D/C weight 151 pounds. f  Admitted on 3/4 with SBO/ileus. Was improving then had cardiac arrest last night (3/8) with PEA in setting of R hemothorax. Now s/p R chest tube and multiple units transfusion. Underwent R VATs 3/9 then bronch on 3/10.   Remains intubated. On 3/13 underwent placement of R chest drain by IR. 100 cc out initially the 40 cc since. CXR only mildly improved. Remains on levophed 5. KUB with ileus.  Co-ox 68%. INR 1.2 On Heparin.  I/Os -2.4L but weight up. Diuresed some yesterday CVP 22->12. No BM overnight  LVAD INTERROGATION:  HeartMate II LVAD: Flow 3.4 liters/min, speed 9400, power 4.9 PI 4.4 No PI events  I reviewed the LVAD parameters from today, and compared the results to the patient's prior recorded data. No programming changes were made. The LVAD is functioning within specified parameters. The patient performs LVAD self-test daily. LVAD interrogation was negative for any significant power changes, alarms or PI events/speed drops. LVAD equipment check completed and is in good working order. Back-up equipment present. LVAD education done on emergency procedures and precautions and reviewed exit site care.   Physical Exam: GENERAL: intubated. sedated HEENT: normal  +ET & NGT NECK: Supple CARDIAC: Mechanical heart sounds with LVAD hum present.  LUNGS: R chest tube.. Decreased BS on R.  ABDOMEN: +distended, nontender,  Hypoactive bowel sounds LVAD exit site: well-healed and incorporated.  Dressing dry and intact. No erythema or drainage. Stabilization device present and accurately applied. Driveline dressing is being changed daily per sterile technique.  EXTREMITIES: Warm and dry, no cyanosis, clubbing, rash or edema  NEUROLOGIC: Intubated sedated  Telemetry: SR   Labs: Basic Metabolic Panel:  Recent Labs Lab 11/07/13 0400 11/08/13 0415 11/08/13 1800 11/09/13 0400 11/10/13 0425 11/11/13 0408  NA 138 137 140 140 137 137  K 4.1 3.4* 3.5* 3.7 3.9 3.6*  CL 102 101 103 102 99 100  CO2 21 24 26 25 26 26   GLUCOSE 97 137* 126* 161* 143* 144*  BUN 33* 31* 27* 26* 24* 20  CREATININE 1.22 1.26 1.25 1.20 1.04 0.88  CALCIUM 8.4 8.1* 8.0* 8.1* 8.0* 8.1*  MG 1.7 2.0  --  2.0 2.1 2.1  PHOS 3.4 3.3  --  2.9 2.5 2.5    Liver Function Tests:  Recent Labs Lab 11/07/13 0400 11/08/13 0415 11/09/13 0400 11/10/13 0425 11/11/13 0408  AST 259* 131* 59* 32 28  ALT 233* 196* 145* 87* 64*  ALKPHOS 81 104 105 110 129*  BILITOT 1.4* 0.9 0.9 1.0 1.2  PROT 5.6* 5.7* 6.3 5.8* 6.0  ALBUMIN 2.8* 2.5* 2.5* 2.3* 2.3*    Recent Labs Lab 11/09/13 1730  AMYLASE 24   No results found for this basename: AMMONIA,  in the last 168 hours  CBC:  Recent Labs Lab 11/07/13 1600 11/08/13 0415 11/09/13 0400 11/10/13 0425 11/11/13 0408  WBC 11.1* 10.2 9.7 7.8 6.3  HGB 9.4* 9.0* 9.6* 8.9* 9.1*  HCT 27.0* 27.1* 29.0* 27.4* 28.1*  MCV 86.3 87.7  89.2 90.4 90.1  PLT 100* 112* 133* 149* 143*    Cardiac Enzymes: No results found for this basename: CKTOTAL, CKMB, CKMBINDEX, TROPONINI,  in the last 168 hours  BNP: BNP (last 3 results)  Recent Labs  09/13/13 1349 09/25/13 0400 11/01/13 1033  PROBNP 3883.0* 7019.0* 877.6*    CBG:  Recent Labs Lab 11/10/13 1547 11/10/13 1913 11/10/13 1956 11/10/13 2350 11/11/13 0355  GLUCAP 142* 121* 107* 166* 129*    Coagulation Studies:  Recent Labs  11/09/13 0400 11/10/13 0425 11/11/13 0408  LABPROT 16.3* 16.0* 14.8  INR 1.34  1.31 1.19    Other results:   Imaging: Dg Abd 1 View  11/10/2013   CLINICAL DATA:  Feeding tube placement  EXAM: ABDOMEN - 1 VIEW  COMPARISON:  DG ABD PORTABLE 1V dated 11/09/2013; DG ABD PORTABLE 1V dated 11/10/2013; DG ABD PORTABLE 1V dated 11/10/2013; CT IMAGE GUIDED DRAINAGE BY PERCUTANEOUS CATHETER dated 11/10/2013  FINDINGS: Two intra procedural fluoroscopic images were obtained as part of feeding tube placement by the technologist. These images demonstrate incomplete visualization of median sternotomy wires and pacer wires as well as a left ventricular assist device. The feeding tube is partly obscured by the left ventricular assist device, with contrast opacifying the stomach.  FLUOROSCOPY TIME:  6 min 51 seconds  IMPRESSION: Feeding tube placement with tip apparently within the stomach, although contrast is partly obscured by the left ventricular assist device and findings would be better confirmed at abdominal radiography. Abdominal radiographs were subsequently performed and reported separately on 11/10/2013. This exam from 11/09/2013 was presented for evaluation on 11/10/2013. Please see reports of subsequent imaging for follow-up.   Electronically Signed   By: Conchita Paris M.D.   On: 11/10/2013 19:52   Dg Chest Port 1 View  11/11/2013   CLINICAL DATA:  Check endotracheal tube position  EXAM: PORTABLE CHEST - 1 VIEW  COMPARISON:  11/10/2013  FINDINGS: Cardiac shadow is stable. A ventricular assist device is again identified and stable. A defibrillator is again noted stable. There is decreasing density overlying the mid and upper right lung consistent with the previous the placed drainage catheter. Some kinking of the catheter is noted. Left lung is clear. An endotracheal tube is noted 2 points 6 cm above the carina and stable in appearance. A nasogastric catheter is been removed and apparent feeding catheter is now seen coursing into the stomach. Its tip is obscured by the ventricular assist  device. A right-sided jugular central line is seen. A right-sided chest tube is again seen and stable.  IMPRESSION: Decrease in the right-sided pleural fluid secondary to catheter placement.  Tubes and lines as described.  No new focal abnormality is seen.   Electronically Signed   By: Inez Catalina M.D.   On: 11/11/2013 07:16   Dg Chest Port 1 View  11/10/2013   CLINICAL DATA:  Right chest tube.  EXAM: PORTABLE CHEST - 1 VIEW  COMPARISON:  Earlier today.  FINDINGS: The right chest tube is unchanged. Stable right upper loculated pleural fluid. Decreased pleural fluid at the right lung base. Stable enlarged cardiac silhouette, median sternotomy wires and left subclavian AICD and pacer leads. Stable left ventricular assist device. The left lung remains clear. The endotracheal tube is in satisfactory position. Nasogastric tube extending into the mid to upper esophagus and curved back upon itself with its tip not included.  IMPRESSION: 1. Malpositioned nasogastric tube with its tip most likely in the oropharynx or nasopharynx. 2. Stable cardiomegaly and  loculated right pleural fluid. 3. Decreased non loculated right pleural fluid.   Electronically Signed   By: Enrique Sack M.D.   On: 11/10/2013 20:32   Dg Chest Port 1 View  11/10/2013   CLINICAL DATA:  Ventricular assist device  EXAM: PORTABLE CHEST - 1 VIEW  COMPARISON:  Portable exam 0621 hr compared to 11/09/2013  FINDINGS: Ventricular assist device, left subclavian AICD leads, and right jugular line stable.  Rotated to the left.  Tip of endotracheal tube projects 1.9 cm above carina.  Minimal enlargement of cardiac silhouette.  Persistent opacity at the upper right hemi thorax likely loculated pleural collection.  Additional small pleural effusion at right base.  Mild persistent infiltrate left lower lobe.  No pneumothorax.  Bones demineralized.  IMPRESSION: No interval change.   Electronically Signed   By: Lavonia Dana M.D.   On: 11/10/2013 08:20   Dg Abd  Portable 1v  11/10/2013   CLINICAL DATA:  Feeding tube and nasogastric tube placement.  EXAM: PORTABLE ABDOMEN - 1 VIEW  COMPARISON:  Portable chest obtained at the same time.  FINDINGS: Previously described malpositioned nasogastric tube curved back upon itself in the mid esophagus. The tip is not included. Feeding tube extending to the gastroesophageal junction with its tip obscured by the left ventricular assist device. Stable right chest tube with its tip overlying the mid liver on the right. Normal bowel gas pattern. Right femoral catheter tip in the region of the right common femoral vein. Splenic artery calcifications without aneurysm. Lower lumbar spine degenerative changes.  IMPRESSION: 1. Previously described malpositioned nasogastric tube with its tip most likely in the oropharynx or nasopharynx. 2. Feeding tube extending to gastroesophageal junction with the tube tip obscured by the left ventricular assist device. 3. Right chest tube tip overlying the right mid liver.   Electronically Signed   By: Enrique Sack M.D.   On: 11/10/2013 20:37   Dg Abd Portable 1v  11/10/2013   CLINICAL DATA:  OG tube placement  EXAM: PORTABLE ABDOMEN - 1 VIEW  COMPARISON:  11/10/2013  FINDINGS: OG catheter tip projects over the right upper quadrant and could be within a right lower lobe bronchus. Left ventricular assist device partially imaged. Nonobstructive bowel gas pattern. Transverse colon is air distended. Right femoral central line noted. Degenerative changes of the spine.  IMPRESSION: OG catheter tip projects in the right upper quadrant, possibly within a right lower lobe bronchus  These results were called by telephone at the time of interpretation on 11/10/2013 at 9:02 AM to [facility] [employer], who verbally acknowledged these results.   Electronically Signed   By: Daryll Brod M.D.   On: 11/10/2013 09:04   Dg Abd Portable 1v  11/10/2013   CLINICAL DATA:  Video-assisted thoracoscopy.  Ileus.  EXAM: PORTABLE ABDOMEN - 1  VIEW  COMPARISON:  11/09/2013  FINDINGS: 0624 hrs. No gaseous small bowel distention. Colonic gas has increased in the interval, specially in the transverse segment and in the region of the hepatic flexure. Lucency along the right lateral abdominal wall presumed to represent fat stripe. Contrast noted within the lumen of the appendix.  Right femoral vascular catheters remain in place. Temperature probe overlies the lower midline anatomic pelvis. The tip of the catheter overlying the right upper quadrant is compatible with previously seen chest tube position in the posterior right costophrenic sulcus.  IMPRESSION: Slight interval increase in colonic gas without features to suggest obstruction.   Electronically Signed   By: Misty Stanley  M.D.   On: 11/10/2013 08:21   Dg Loyce Dys Tube Plc W/fl-no Rad  11/10/2013   CLINICAL DATA: need post pyloric feeding tube for malnutrition   NASO G TUBE PLACEMENT WITH FLUORO  Fluoroscopy was utilized by the requesting physician.  No radiographic  interpretation.    Ct Image Guided Drainage By Percutaneous Catheter  11/10/2013   ADDENDUM REPORT: 11/10/2013 17:11  ADDENDUM: An initial CT scan of the chest with contrast material was performed in the left lateral decubitus position to fully evaluate the complex right fluid collection an to serve as planning for the percutaneous intervention.  There is a complex loculated fluid collection at the right lung apex with layering hematocrit levels most suggestive of evolving hemothorax. There is a persistent small left pleural effusion. A basally directed right thoracostomy tube is in good position. There is minimal residual fluid in all inferior aspect of the lung base. No pulmonary edema. Atelectasis and likely an element of pleural parenchymal scarring is noted in the inferior lingula and left lower lobe. There is diffuse mild bronchial wall thickening. The heart is enlarged. A left ventricular assist device is present. No acute  abnormality involving the device. The endotracheal tube is in good position. A right IJ central venous catheter terminates at the superior cavoatrial junction left subclavian approach cardiac rhythm maintenance device with leads in the right atrium and right ventricular apex.  The visualized upper thorax is unremarkable. No acute osseous abnormality.   Electronically Signed   By: Jacqulynn Cadet M.D.   On: 11/10/2013 17:11   11/10/2013   CLINICAL DATA:  73 year old male with loculated right hemothorax. He underwent prior VATS with removal of blood from the mid and inferior pleural space. However, there is loculated fluid at the apex of the lung and the lung is tethered. CT-guided placement of a percutaneous drain is warranted in effort to remove the loculated blood products.  EXAM: CT GUIDED placement of a right-sided thoracostomy tube  ANESTHESIA/SEDATION: Precedex from ICU. In additional 60 mcg of fentanyl was administered intravenously.  Total monitored sedation time was 20 min.  PROCEDURE: The procedure, risks, benefits, and alternatives were explained to the patient. Questions regarding the procedure were encouraged and answered. The patient understands and consents to the procedure.  The patient was placed in the left lateral decubitus position. A CT scan of the chest with contrast material was performed. There is a loculated heterogeneous attenuation fluid in in the right lung apex partially compressing the right upper lobe. The appearance of the fluid is most consistent with evolving hemo thorax. A suitable skin entry site was selected and marked. Local anesthesia was attained by infiltration with 1% lidocaine. Under intermittent CT fluoroscopic guidance, a needle was directed over rib and into the apex of the pleural space.  Approximately 100 mL of thick, dark reddish brown bloody fluid was removed. The contents are consistent with old hematoma. A sample was selected and sent for culture. The tube was  secured to the skin with 0 silk suture. An air tight gauze dressing was placed. The tube was connected to a Pleur-Evac at 20 cm water.  COMPLICATIONS: None immediate  FINDINGS: Loculated hematoma at the lung apex.  IMPRESSION: Successful placement of a 6 F Cook all-purpose drain yielding 100 mL of thick, dark old blood.  Recommend maintaining the tube to low wall suction. If there is insufficient drainage over the next 24 hr, we will consider serial TPA dwells in an effort to break up the  residual blood products and facilitate drainage.  Signed,  Criselda Peaches, MD  [facility]  [facility]  Electronically Signed: By: Jacqulynn Cadet M.D. On: 11/10/2013 15:17     Assessment:   1. Hemorrhagic shock due to R hemothorax    --s/p R chest tube 3/8 2. 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 CAD 4. Hypothyroidism 5. SBO 6. Acute blood loss anemia 7. PEA arrest 8. Shock liver 9. A/C renal failure, stage III -resolved 10. Ventilatory-dependent respiratory failure 11. Hypokalemia  Plan/Discussion:    He is s/p drainage tube placement to drain blood on surface of R lung. Drainage sluggish due to clot. IR to dwell TPA today.   Remains on levophed. He is very volume sensitive in setting of known RV failure. CVP now down to 12. Flows on VAD are down as well. Will not give lasix this am. May get dose later today. Keep CVP 10-14 range. Will work to wean levophed today.  Ileus persists. No evidence dead gut. On TPN. Panda in stomach. Continue Reglan.   Hgb stable. On heparin/coumadin. INR 1.1  VAD parameters look good.  The patient is critically ill with multiple organ systems failure and requires high complexity decision making for assessment and support, frequent evaluation and titration of therapies, application of advanced monitoring technologies and extensive interpretation of multiple databases.   Critical Care Time devoted to patient care  services described in this note is 45 Minutes.  Length of Stay: 10 Glori Bickers MD 11/11/2013, 8:04 AM  Advanced Heart Failure Team Pager [phone] (M-F; [place])  Please contact [facility] for night-coverage after hours (4p -7a ) and weekends on [email or web address]

## 2013-11-11 NOTE — Progress Notes (Signed)
[facility] ADULT ICU REPLACEMENT PROTOCOL FOR AM LAB REPLACEMENT ONLY  The patient does apply for the [facility] Adult ICU Electrolyte Replacment Protocol based on the criteria listed below:   1. Is GFR >/= 40 ml/min? yes  Patient's GFR today is 85 2. Is urine output >/= 0.5 ml/kg/hr for the last 6 hours? yes Patient's UOP is 0.9 ml/kg/hr 3. Is BUN < 60 mg/dL? yes  Patient's BUN today is 20 4. Abnormal electrolyte(s): K+3.6 5. Ordered repletion with: protocol 6. If a panic level lab has been reported, has the CCM MD in charge been notified? yes.   Physician:  E Deterding  Concepcion Living [facility] 11/11/2013 5:28 AM

## 2013-11-11 NOTE — Progress Notes (Signed)
crystPARENTERAL NUTRITION CONSULT NOTE: F/U  Pharmacy Consult:  TPN Indication: Ileus  Allergies  Allergen Reactions  . Diovan [Valsartan] Other (See Comments)    Hypotension at low dose, hyperkalemia  . Lipitor [Atorvastatin Calcium] Other (See Comments)    Muscle pain    Patient Measurements: Height: 5\' 9"  (175.3 cm) Weight: 157 lb 6.5 oz (71.4 kg) IBW/kg (Calculated) : 70.7 Usual Weight: 68 kg  Vital Signs: Temp: 97.4 F (36.3 C) (03/14 0751) Temp src: Oral (03/14 0751) BP: 78/63 mmHg (03/14 0331) Pulse Rate: 58 (03/14 0915) Intake/Output from previous day: 03/13 0701 - 03/14 0700 In: 3078.2 [I.V.:611.7; NG/GT:90; IV Piggyback:450; TPN:1926.5] Out: [id number] [Urine:4125; Chest Tube:100]  Labs:  Recent Labs  11/09/13 0400  11/10/13 0425 11/10/13 2000 11/11/13 0408  WBC 9.7  --  7.8  --  6.3  HGB 9.6*  --  8.9*  --  9.1*  HCT 29.0*  --  27.4*  --  28.1*  PLT 133*  --  149*  --  143*  APTT 47*  < > 48* 67* 51*  INR 1.34  --  1.31  --  1.19  < > = values in this interval not displayed.   Recent Labs  11/09/13 0400 11/10/13 0425 11/11/13 0408  NA 140 137 137  K 3.7 3.9 3.6*  CL 102 99 100  CO2 25 26 26   GLUCOSE 161* 143* 144*  BUN 26* 24* 20  CREATININE 1.20 1.04 0.88  CALCIUM 8.1* 8.0* 8.1*  MG 2.0 2.1 2.1  PHOS 2.9 2.5 2.5  PROT 6.3 5.8* 6.0  ALBUMIN 2.5* 2.3* 2.3*  AST 59* 32 28  ALT 145* 87* 64*  ALKPHOS 105 110 129*  BILITOT 0.9 1.0 1.2   Estimated Creatinine Clearance: 78.1 ml/min (by C-G formula based on Cr of 0.88).    Recent Labs  11/10/13 2350 11/11/13 0355 11/11/13 0747  GLUCAP 166* 129* 150*     Insulin Requirements in the past 24 hours:  7 unit sensitive SSI/24h with CBGs 107-166/24h  Assessment: 41 YOM with history of recent LVAD and discharged on 09/19/13.  Patient returned with N/V and CT scan revealed SBO.   GI:  SBO believed to have resolved. Had flatus and several BMs. TPN d/c'd 3/7. Resumed TPN 3/10 for inability to  take po's/TFs and high NG output. KUB=Ileus with last BM noted 3/12. Prealbumin 10.4. IV Reglan QID, IV PPI. Panda placed but Vital not yet started (goal 18ml/hr)  Endo: hypothyroid on Synthroid (dose increased).  No hx DM - CBGs controlled on SSI  Lytes: K 3.6 (goal>4 with ileus), Kruns x 2 per protocol  Renal: Scr 0.88 with UOP 2.5  Cards: CAD s/p stent to LCX 2004 with re-occlusion of LCX and lateral MI 2009 and repeat stenting, total occlusion of RCA, ICM, chronic sHF, s/p LVAD 09/19/13 -Cardiac arrest 3/8 with PEA with R hemothorax. Cardiogenic/hemorrhagic shock. 3/9: right VATS and evacuation of hematoma-hemothorax. Levophed drip.  Hepatobil: Shock liver s/p PEA arrest 3/8. Tbili 1.0 with elevated LFTs improving. INR 1.19  Heme: Hgb 9.1. Plts 143  Resp: VDRF. Has R-sided chest tube and large R pleural effusion concerning for hemorrhage (on 3/9 CT). Chest tube with 100 output. Now on Precedex. FiO2 40%.  ID: [email or web address]. WBc 6.3 Tc=99  Anticoag: LVAD. Coumadin PTA with INR 3.5 upon admit. Reversed. Heparin drip for LVAD at 11106ml/hr per MD (goal ptt 50-60). INR down to 1.19 from shock liver.  Best Practices: PPI IV, MC,  TPN Access: CVC  placed 11/02/13  TPN day#: 3/5-3/8, resume 3/10  Current Nutrition:  TPN at goal 50ml/hr = 1894kcal and 99.6g protein  Nutritional Goals:  1750-1850 kcal and 105-115g protein per RD note 3/10.   Plan:  - K runs x 2 - Clinimix E at 68ml/hr. Lipids 20% 31ml/hr - Daily IV multivitamin in TPN - Trace elements on M/W/F due to national shortage - f/u start and toleration of TF.    Nicholes Hibler S. Alford Highland, PharmD, [facility] Clinical Staff Pharmacist Pager [phone]   11/11/2013, 9:26 AM

## 2013-11-11 NOTE — Progress Notes (Signed)
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  ET tube in place 3/9 CT Chest abd pelvis: Ventricular assistance device and left-sided pacemaker are noted. Right-sided chest tube has been placed with distal tip directed toward posterior portion of chest. Large right pleural effusion is noted with high density material layering within it concerning for hemorrhage 3/13 Pigtail cath placement by IR to R apical fluid collection > bloody fluid removed  LINES / TUBES: R Morley CVL 3/5 >>  Right Femoral CVL 3/5>>>3/10 Right Femoral A line 3/5>>  ETT 3/08 >>  R femoral A-line 3/08 >>   CULTURES: BAL 3/10>>>NTD Sputum 3/9>> NOF Blood 3/8>>>NTD  ANTIBIOTICS: Zosyn 3/5>>> Vancomcyin 3/5>>>  SUBJECTIVE:  RASS 0. + F/C. Tolerated PS 10-14 cm H2O  VITAL SIGNS: Temp:  [97.4 F (36.3 C)-98.7 F (37.1 C)] 97.9 F (36.6 C) (03/14 1543) Pulse Rate:  [32-93] 59 (03/14 1600) Resp:  [10-28] 11 (03/14 1600) BP: (78-112)/(63-84) 78/63 mmHg (03/14 0331) SpO2:  [95 %-100 %] 98 % (03/14 1600) Arterial Line BP: (66-137)/(52-107) 82/68 mmHg (03/14 1600) FiO2 (%):  [40 %] 40 % (03/14 1206) Weight:  [71.4 kg (157 lb 6.5 oz)] 71.4 kg (157 lb 6.5 oz) (03/14 0500)  HEMODYNAMICS: CVP:  [7 mmHg-20 mmHg] 12 mmHg  VENTILATOR SETTINGS: Vent Mode:  [-] PSV;CPAP FiO2 (%):  [40 %] 40 % Set Rate:  [14 bmp] 14 bmp Vt Set:  [500 mL] 500 mL PEEP:  [5 cmH20] 5 cmH20 Pressure Support:  [14 cmH20] 14 cmH20 Plateau Pressure:  [17 cmH20-23 cmH20] 17 cmH20  INTAKE /  OUTPUT: Intake/Output     03/13 0701 - 03/14 0700 03/14 0701 - 03/15 0700   P.O.     I.V. (mL/kg) 611.7 (8.6) 395 (5.5)   Other     NG/GT 90 40   IV Piggyback 450 125   TPN 1926.5 837   Total Intake(mL/kg) 3078.2 (43.1) 1397 (19.6)   Urine (mL/kg/hr) 4125 (2.4) 495 (0.7)   Emesis/NG output     Stool  1 (0)   Chest Tube 100 (0.1)    Total Output 4225 496   Net -1146.8 +901         PHYSICAL EXAMINATION: General: NAD. Neuro: no focal deficits HEENT: WNL Cardiovascular: RRR, Nl S1/S2, -M/R/G. Lungs: Coarse BS diffusely.  Abdomen: Soft, NT, ND and +BS. Musculoskeletal:  No lower extremity edema, unable to palpate pulses Skin:  Pale, no ecchymosis, no evidence of retropertoneal bleed  LABS: I have reviewed all of today's lab results. Relevant abnormalities are discussed in the A/P section   CXR: GGO in RUL area. No overt edema    ASSESSMENT / PLAN:  PULMONARY A:  Respiratory Failure Hemothorax  P:   Wean in PSV mode today Anticipate extubation 3/14 Mgmt of chest tube/cath per TCTS and IR  CARDIOVASCULAR A: Cardiogenic/Hemorrhagic Shock - resolving LVAD in place P:  Mgmt per Cards/TCTS  RENAL A:  AKI, resolved.  P: Monitor BMET intermittently Monitor I/Os Correct electrolytes as indicated   GASTROINTESTINAL A:  No issues P:   SUP: PPI Cont TFs  HEMATOLOGIC A: Acute blood loss anemia - no further bleeding noted P:  Mgmt per Cards and TCTS  INFECTIOUS A:  Doubt sepsis P:   Would consider DC abx in next day or two if cx data negative  ENDOCRINE A: Hyperglycemia P:   - SSI in place  NEUROLOGIC A: ICU associated pain/[place] P:   Cont current Rx Daily WUA  I have personally obtained a history, examined the patient, evaluated laboratory and imaging results, formulated the assessment and plan and placed orders.  CRITICAL CARE: The patient is critically ill with multiple organ systems failure and requires high complexity decision making  for assessment and support, frequent evaluation and titration of therapies, application of advanced monitoring technologies and extensive interpretation of multiple databases. Critical Care Time devoted to patient care services described in this note is 35 minutes.   Merton Border, MD ; [facility] [phone].  After 5:30 PM or weekends, call [phone]

## 2013-11-12 ENCOUNTER — Inpatient Hospital Stay (HOSPITAL_COMMUNITY): Payer: Medicare Other

## 2013-11-12 LAB — GLUCOSE, CAPILLARY
Glucose-Capillary: 112 mg/dL — ABNORMAL HIGH (ref 70–99)
Glucose-Capillary: 114 mg/dL — ABNORMAL HIGH (ref 70–99)
Glucose-Capillary: 131 mg/dL — ABNORMAL HIGH (ref 70–99)
Glucose-Capillary: 143 mg/dL — ABNORMAL HIGH (ref 70–99)
Glucose-Capillary: 146 mg/dL — ABNORMAL HIGH (ref 70–99)
Glucose-Capillary: 170 mg/dL — ABNORMAL HIGH (ref 70–99)

## 2013-11-12 LAB — POCT I-STAT 3, ART BLOOD GAS (G3+)
Acid-Base Excess: 4 mmol/L — ABNORMAL HIGH (ref 0.0–2.0)
Acid-Base Excess: 2 mmol/L (ref 0.0–2.0)
Acid-Base Excess: 3 mmol/L — ABNORMAL HIGH (ref 0.0–2.0)
Bicarbonate: 28.2 mEq/L — ABNORMAL HIGH (ref 20.0–24.0)
Bicarbonate: 26.1 mEq/L — ABNORMAL HIGH (ref 20.0–24.0)
Bicarbonate: 27.5 mEq/L — ABNORMAL HIGH (ref 20.0–24.0)
O2 Saturation: 99 %
O2 Saturation: 99 %
O2 Saturation: 92 %
Patient temperature: 98.5
TCO2: 29 mmol/L (ref 0–100)
TCO2: 27 mmol/L (ref 0–100)
TCO2: 29 mmol/L (ref 0–100)
pCO2 arterial: 40.9 mmHg (ref 35.0–45.0)
pCO2 arterial: 37.8 mmHg (ref 35.0–45.0)
pCO2 arterial: 39.2 mmHg (ref 35.0–45.0)
pH, Arterial: 7.446 (ref 7.350–7.450)
pH, Arterial: 7.446 (ref 7.350–7.450)
pH, Arterial: 7.454 — ABNORMAL HIGH (ref 7.350–7.450)
pO2, Arterial: 110 mmHg — ABNORMAL HIGH (ref 80.0–100.0)
pO2, Arterial: 113 mmHg — ABNORMAL HIGH (ref 80.0–100.0)
pO2, Arterial: 62 mmHg — ABNORMAL LOW (ref 80.0–100.0)

## 2013-11-12 LAB — CBC
HCT: 27.1 % — ABNORMAL LOW (ref 39.0–52.0)
Hemoglobin: 8.9 g/dL — ABNORMAL LOW (ref 13.0–17.0)
MCH: 29.1 pg (ref 26.0–34.0)
MCHC: 32.8 g/dL (ref 30.0–36.0)
MCV: 88.6 fL (ref 78.0–100.0)
Platelets: 171 10*3/uL (ref 150–400)
RBC: 3.06 MIL/uL — ABNORMAL LOW (ref 4.22–5.81)
RDW: 16.2 % — ABNORMAL HIGH (ref 11.5–15.5)
WBC: 7.2 10*3/uL (ref 4.0–10.5)

## 2013-11-12 LAB — CULTURE, BLOOD (ROUTINE X 2)

## 2013-11-12 LAB — CARBOXYHEMOGLOBIN
Carboxyhemoglobin: 1.6 % — ABNORMAL HIGH (ref 0.5–1.5)
Methemoglobin: 0.9 % (ref 0.0–1.5)
O2 Saturation: 66.9 %
Total hemoglobin: 8.9 g/dL — ABNORMAL LOW (ref 13.5–18.0)

## 2013-11-12 LAB — COMPREHENSIVE METABOLIC PANEL
ALT: 53 U/L (ref 0–53)
AST: 30 U/L (ref 0–37)
Albumin: 2.3 g/dL — ABNORMAL LOW (ref 3.5–5.2)
Alkaline Phosphatase: 147 U/L — ABNORMAL HIGH (ref 39–117)
BUN: 21 mg/dL (ref 6–23)
CO2: 26 mEq/L (ref 19–32)
Calcium: 8.2 mg/dL — ABNORMAL LOW (ref 8.4–10.5)
Chloride: 99 mEq/L (ref 96–112)
Creatinine, Ser: 0.86 mg/dL (ref 0.50–1.35)
GFR calc Af Amer: >90 mL/min (ref >90)
GFR calc non Af Amer: 86 mL/min — ABNORMAL LOW (ref >90)
Glucose, Bld: 158 mg/dL — ABNORMAL HIGH (ref 70–99)
Potassium: 3.7 mEq/L (ref 3.7–5.3)
Sodium: 135 mEq/L — ABNORMAL LOW (ref 137–147)
Total Bilirubin: 1.1 mg/dL (ref 0.3–1.2)
Total Protein: 6.1 g/dL (ref 6.0–8.3)

## 2013-11-12 LAB — PROTIME-INR
INR: 1.16 (ref 0.00–1.49)
Prothrombin Time: 14.6 seconds (ref 11.6–15.2)

## 2013-11-12 LAB — APTT
aPTT: 53 seconds — ABNORMAL HIGH (ref 24–37)
aPTT: 43 seconds — ABNORMAL HIGH (ref 24–37)

## 2013-11-12 MED ORDER — FENTANYL CITRATE 0.05 MG/ML IJ SOLN
12.5000 ug | INTRAMUSCULAR | Status: DC | PRN
  Administered 2013-11-12: 25 ug via INTRAVENOUS
  Filled 2013-11-12: qty 2

## 2013-11-12 MED ORDER — RACEPINEPHRINE HCL 2.25 % IN NEBU
INHALATION_SOLUTION | RESPIRATORY_TRACT | Status: AC
  Administered 2013-11-12: 0.5 mL
  Filled 2013-11-12: qty 0.5

## 2013-11-12 MED ORDER — BUDESONIDE 0.25 MG/2ML IN SUSP
0.2500 mg | Freq: Two times a day (BID) | RESPIRATORY_TRACT | Status: DC
  Administered 2013-11-12 – 2013-11-17 (×9): 0.25 mg via RESPIRATORY_TRACT
  Filled 2013-11-12 (×12): qty 2

## 2013-11-12 MED ORDER — IPRATROPIUM-ALBUTEROL 0.5-2.5 (3) MG/3ML IN SOLN
3.0000 mL | Freq: Once | RESPIRATORY_TRACT | Status: AC
  Administered 2013-11-12: 3 mL via RESPIRATORY_TRACT
  Filled 2013-11-12: qty 3

## 2013-11-12 MED ORDER — FENTANYL CITRATE 0.05 MG/ML IJ SOLN
12.5000 ug | INTRAMUSCULAR | Status: DC | PRN
Start: 2013-11-12 — End: 2013-11-17
  Administered 2013-11-12 – 2013-11-13 (×5): 25 ug via INTRAVENOUS
  Filled 2013-11-12 (×4): qty 2

## 2013-11-12 MED ORDER — M.V.I. ADULT IV INJ
INTRAVENOUS | Status: AC
  Administered 2013-11-12: 18:00:00 via INTRAVENOUS
  Filled 2013-11-12: qty 2000

## 2013-11-12 MED ORDER — FUROSEMIDE 10 MG/ML IJ SOLN: 20.0000 mg | Freq: Once | INTRAMUSCULAR | Status: DC

## 2013-11-12 MED ORDER — FLEET ENEMA 7-19 GM/118ML RE ENEM
1.0000 | ENEMA | Freq: Once | RECTAL | Status: AC
  Administered 2013-11-12: 1 via RECTAL
  Filled 2013-11-12: qty 1

## 2013-11-12 MED ORDER — POTASSIUM CHLORIDE 10 MEQ/50ML IV SOLN
10.0000 meq | INTRAVENOUS | Status: AC
  Administered 2013-11-12 (×2): 10 meq via INTRAVENOUS
  Filled 2013-11-12 (×2): qty 50

## 2013-11-12 MED ORDER — ALTEPLASE 100 MG IV SOLR
4.0000 mg | Freq: Once | INTRAVENOUS | Status: AC
  Administered 2013-11-12: 4 mg
  Filled 2013-11-12: qty 4

## 2013-11-12 MED ORDER — FAT EMULSION 20 % IV EMUL
250.0000 mL | INTRAVENOUS | Status: AC
  Administered 2013-11-12: 250 mL via INTRAVENOUS
  Filled 2013-11-12: qty 250

## 2013-11-12 MED ORDER — RACEPINEPHRINE HCL 2.25 % IN NEBU
0.5000 mL | INHALATION_SOLUTION | Freq: Once | RESPIRATORY_TRACT | Status: AC
  Administered 2013-11-12: 0.5 mL via RESPIRATORY_TRACT
  Filled 2013-11-12: qty 0.5

## 2013-11-12 NOTE — Progress Notes (Signed)
6 Days Post-Op  Subjective: Rt apical chest tube placed 3/13 Output still minimal: 30 cc yesterday Discussed with Dr Laurence Ferrari TPA injection today  Objective: Vital signs in last 24 hours: Temp:  [97.8 F (36.6 C)-98.8 F (37.1 C)] 97.8 F (36.6 C) (03/15 0729) Pulse Rate:  [54-78] 58 (03/15 1030) Resp:  [10-22] 10 (03/15 1030) BP: (79-111)/(64-84) 107/83 mmHg (03/15 0420) SpO2:  [96 %-100 %] 100 % (03/15 1030) Arterial Line BP: (62-119)/(46-93) 91/75 mmHg (03/15 1030) FiO2 (%):  [40 %] 40 % (03/15 1000) Weight:  [71.4 kg (157 lb 6.5 oz)] 71.4 kg (157 lb 6.5 oz) (03/15 0415) Last BM Date: 11/11/13  Intake/Output from previous day: 03/14 0701 - 03/15 0700 In: 4013.1 [I.V.:1131.1; NG/GT:100; IV Piggyback:550; [id number] Out: 2696 [Urine:2635; Drains:30; Stool:1; Chest Tube:30] Intake/Output this shift: Total I/O In: 413.2 [I.V.:41.7; NG/GT:30; IV Piggyback:62.5; TPN:279] Out: 450 [Urine:450]  PE: afeb; vss Chest tube intact; no air leak Little new output 4 mg TPA/20 cc sterile saline injected into Rt chest tube site Flushed with 5 cc sterile saline clamped for now Unclamp in 1 hr  Lab Results:   Recent Labs  11/11/13 0408 11/12/13 0330  WBC 6.3 7.2  HGB 9.1* 8.9*  HCT 28.1* 27.1*  PLT 143* 171   BMET  Recent Labs  11/11/13 0408 11/12/13 0330  NA 137 135*  K 3.6* 3.7  CL 100 99  CO2 26 26  GLUCOSE 144* 158*  BUN 20 21  CREATININE 0.88 0.86  CALCIUM 8.1* 8.2*   PT/INR  Recent Labs  11/11/13 0408 11/12/13 0330  LABPROT 14.8 14.6  INR 1.19 1.16   ABG  Recent Labs  11/11/13 0411 11/12/13 0505  PHART 7.394 7.446  HCO3 28.2* 28.2*    Studies/Results: Dg Abd 1 View  11/10/2013   CLINICAL DATA:  Feeding tube placement  EXAM: ABDOMEN - 1 VIEW  COMPARISON:  DG ABD PORTABLE 1V dated 11/09/2013; DG ABD PORTABLE 1V dated 11/10/2013; DG ABD PORTABLE 1V dated 11/10/2013; CT IMAGE GUIDED DRAINAGE BY PERCUTANEOUS CATHETER dated 11/10/2013  FINDINGS: Two  intra procedural fluoroscopic images were obtained as part of feeding tube placement by the technologist. These images demonstrate incomplete visualization of median sternotomy wires and pacer wires as well as a left ventricular assist device. The feeding tube is partly obscured by the left ventricular assist device, with contrast opacifying the stomach.  FLUOROSCOPY TIME:  6 min 51 seconds  IMPRESSION: Feeding tube placement with tip apparently within the stomach, although contrast is partly obscured by the left ventricular assist device and findings would be better confirmed at abdominal radiography. Abdominal radiographs were subsequently performed and reported separately on 11/10/2013. This exam from 11/09/2013 was presented for evaluation on 11/10/2013. Please see reports of subsequent imaging for follow-up.   Electronically Signed   By: Conchita Paris M.D.   On: 11/10/2013 19:52   Dg Chest Port 1 View  11/12/2013   CLINICAL DATA:  Ventricular system lies  EXAM: PORTABLE CHEST - 1 VIEW  COMPARISON:  11/11/2013  FINDINGS: Cardiac shadow is stable. The left ventricular assist device is again noted. A defibrillator is again seen and stable. The endotracheal tube and feeding catheter are stable in appearance. A right jugular central venous line is again noted at the cavoatrial junction. The right upper lobe pleural drain is stable in appearance. Persistent density is noted in the right apex. A second right thoracostomy catheter is stable as well. The left lung remains clear.  IMPRESSION: No significant interval  change from the prior exam.   Electronically Signed   By: Inez Catalina M.D.   On: 11/12/2013 07:27   Dg Chest Port 1 View  11/11/2013   CLINICAL DATA:  Check endotracheal tube position  EXAM: PORTABLE CHEST - 1 VIEW  COMPARISON:  11/10/2013  FINDINGS: Cardiac shadow is stable. A ventricular assist device is again identified and stable. A defibrillator is again noted stable. There is decreasing density  overlying the mid and upper right lung consistent with the previous the placed drainage catheter. Some kinking of the catheter is noted. Left lung is clear. An endotracheal tube is noted 2 points 6 cm above the carina and stable in appearance. A nasogastric catheter is been removed and apparent feeding catheter is now seen coursing into the stomach. Its tip is obscured by the ventricular assist device. A right-sided jugular central line is seen. A right-sided chest tube is again seen and stable.  IMPRESSION: Decrease in the right-sided pleural fluid secondary to catheter placement.  Tubes and lines as described.  No new focal abnormality is seen.   Electronically Signed   By: Inez Catalina M.D.   On: 11/11/2013 07:16   Dg Chest Port 1 View  11/10/2013   CLINICAL DATA:  Right chest tube.  EXAM: PORTABLE CHEST - 1 VIEW  COMPARISON:  Earlier today.  FINDINGS: The right chest tube is unchanged. Stable right upper loculated pleural fluid. Decreased pleural fluid at the right lung base. Stable enlarged cardiac silhouette, median sternotomy wires and left subclavian AICD and pacer leads. Stable left ventricular assist device. The left lung remains clear. The endotracheal tube is in satisfactory position. Nasogastric tube extending into the mid to upper esophagus and curved back upon itself with its tip not included.  IMPRESSION: 1. Malpositioned nasogastric tube with its tip most likely in the oropharynx or nasopharynx. 2. Stable cardiomegaly and loculated right pleural fluid. 3. Decreased non loculated right pleural fluid.   Electronically Signed   By: Enrique Sack M.D.   On: 11/10/2013 20:32   Dg Abd Portable 1v  11/12/2013   CLINICAL DATA:  Ileus  EXAM: PORTABLE ABDOMEN - 1 VIEW  COMPARISON:  11/11/2013  FINDINGS: Contrast material is noted within the right colon. A right central venous line is noted in the femoral vein. No bony abnormality is seen. No obstructive changes are noted.  IMPRESSION: The overall  appearance is similar to that seen on the prior exam. No obstructive changes are noted.   Electronically Signed   By: Inez Catalina M.D.   On: 11/12/2013 07:32   Dg Abd Portable 1v  11/11/2013   CLINICAL DATA:  Small bowel obstruction  EXAM: PORTABLE ABDOMEN - 1 VIEW  COMPARISON:  Abdomen radiograph 11/10/2013 and chest radiograph 11/11/2013  FINDINGS: Oral contrast material is seen in the visualized portion of the stomach, within nondilated small bowel loops, and throughout the majority of the colon. There is mild diffuse distention of the colon. Oral contrast is seen in the rectum. There are no dilated small bowel loops. Distal tip of a right-sided chest tube projects over the right upper quadrant. Left ventricular assist device projects over the left upper quadrant. Right femoral catheter terminates air S1 to the right of midline. Rectal thermistor place.  IMPRESSION: Oral contrast is seen throughout the bowel to the level of the colon. No evidence of bowel obstruction. There is mild distention of the colon consistent with ileus.   Electronically Signed   By: Audelia Acton.D.  On: 11/11/2013 09:49   Dg Abd Portable 1v  11/10/2013   CLINICAL DATA:  Feeding tube and nasogastric tube placement.  EXAM: PORTABLE ABDOMEN - 1 VIEW  COMPARISON:  Portable chest obtained at the same time.  FINDINGS: Previously described malpositioned nasogastric tube curved back upon itself in the mid esophagus. The tip is not included. Feeding tube extending to the gastroesophageal junction with its tip obscured by the left ventricular assist device. Stable right chest tube with its tip overlying the mid liver on the right. Normal bowel gas pattern. Right femoral catheter tip in the region of the right common femoral vein. Splenic artery calcifications without aneurysm. Lower lumbar spine degenerative changes.  IMPRESSION: 1. Previously described malpositioned nasogastric tube with its tip most likely in the oropharynx or  nasopharynx. 2. Feeding tube extending to gastroesophageal junction with the tube tip obscured by the left ventricular assist device. 3. Right chest tube tip overlying the right mid liver.   Electronically Signed   By: Enrique Sack M.D.   On: 11/10/2013 20:37   Dg Loyce Dys Tube Plc W/fl-no Rad  11/10/2013   CLINICAL DATA: need post pyloric feeding tube for malnutrition   NASO G TUBE PLACEMENT WITH FLUORO  Fluoroscopy was utilized by the requesting physician.  No radiographic  interpretation.    Ct Image Guided Drainage By Percutaneous Catheter  11/10/2013   ADDENDUM REPORT: 11/10/2013 17:11  ADDENDUM: An initial CT scan of the chest with contrast material was performed in the left lateral decubitus position to fully evaluate the complex right fluid collection an to serve as planning for the percutaneous intervention.  There is a complex loculated fluid collection at the right lung apex with layering hematocrit levels most suggestive of evolving hemothorax. There is a persistent small left pleural effusion. A basally directed right thoracostomy tube is in good position. There is minimal residual fluid in all inferior aspect of the lung base. No pulmonary edema. Atelectasis and likely an element of pleural parenchymal scarring is noted in the inferior lingula and left lower lobe. There is diffuse mild bronchial wall thickening. The heart is enlarged. A left ventricular assist device is present. No acute abnormality involving the device. The endotracheal tube is in good position. A right IJ central venous catheter terminates at the superior cavoatrial junction left subclavian approach cardiac rhythm maintenance device with leads in the right atrium and right ventricular apex.  The visualized upper thorax is unremarkable. No acute osseous abnormality.   Electronically Signed   By: Jacqulynn Cadet M.D.   On: 11/10/2013 17:11   11/10/2013   CLINICAL DATA:  73 year old male with loculated right hemothorax. He  underwent prior VATS with removal of blood from the mid and inferior pleural space. However, there is loculated fluid at the apex of the lung and the lung is tethered. CT-guided placement of a percutaneous drain is warranted in effort to remove the loculated blood products.  EXAM: CT GUIDED placement of a right-sided thoracostomy tube  ANESTHESIA/SEDATION: Precedex from ICU. In additional 60 mcg of fentanyl was administered intravenously.  Total monitored sedation time was 20 min.  PROCEDURE: The procedure, risks, benefits, and alternatives were explained to the patient. Questions regarding the procedure were encouraged and answered. The patient understands and consents to the procedure.  The patient was placed in the left lateral decubitus position. A CT scan of the chest with contrast material was performed. There is a loculated heterogeneous attenuation fluid in in the right lung apex partially compressing  the right upper lobe. The appearance of the fluid is most consistent with evolving hemo thorax. A suitable skin entry site was selected and marked. Local anesthesia was attained by infiltration with 1% lidocaine. Under intermittent CT fluoroscopic guidance, a needle was directed over rib and into the apex of the pleural space.  Approximately 100 mL of thick, dark reddish brown bloody fluid was removed. The contents are consistent with old hematoma. A sample was selected and sent for culture. The tube was secured to the skin with 0 silk suture. An air tight gauze dressing was placed. The tube was connected to a Pleur-Evac at 20 cm water.  COMPLICATIONS: None immediate  FINDINGS: Loculated hematoma at the lung apex.  IMPRESSION: Successful placement of a 44 F Cook all-purpose drain yielding 100 mL of thick, dark old blood.  Recommend maintaining the tube to low wall suction. If there is insufficient drainage over the next 24 hr, we will consider serial TPA dwells in an effort to break up the residual blood  products and facilitate drainage.  Signed,  Criselda Peaches, MD  [facility]  [facility]  Electronically Signed: By: Jacqulynn Cadet M.D. On: 11/10/2013 15:17    Anti-infectives: Anti-infectives   Start     Dose/Rate Route Frequency Ordered Stop   11/07/13 1800  vancomycin (VANCOCIN) IVPB 750 mg/150 ml premix     750 mg 150 mL/hr over 60 Minutes Intravenous Every 12 hours 11/07/13 0727     11/06/13 1130  vancomycin (VANCOCIN) 1,250 mg in sodium chloride 0.9 % 250 mL IVPB  Status:  Discontinued     1,250 mg 166.7 mL/hr over 90 Minutes Intravenous To Surgery 11/06/13 1119 11/06/13 1433   11/06/13 1130  cefUROXime (ZINACEF) 1.5 g in dextrose 5 % 50 mL IVPB  Status:  Discontinued     1.5 g 100 mL/hr over 30 Minutes Intravenous To Surgery 11/06/13 1119 11/06/13 1433   11/06/13 1130  cefUROXime (ZINACEF) 750 mg in dextrose 5 % 50 mL IVPB  Status:  Discontinued     750 mg 100 mL/hr over 30 Minutes Intravenous To Surgery 11/06/13 1117 11/06/13 1433   11/06/13 0900  cefUROXime (ZINACEF) 1.5 g in dextrose 5 % 50 mL IVPB     1.5 g 100 mL/hr over 30 Minutes Intravenous 60 min pre-op 11/06/13 0831 11/06/13 1224   11/05/13 2300  vancomycin (VANCOCIN) IVPB 1000 mg/200 mL premix  Status:  Discontinued     1,000 mg 200 mL/hr over 60 Minutes Intravenous Every 24 hours 11/05/13 2221 11/07/13 0727   11/05/13 2300  piperacillin-tazobactam (ZOSYN) IVPB 3.375 g     3.375 g 12.5 mL/hr over 240 Minutes Intravenous 3 times per day 11/05/13 2221     11/02/13 0130  cefTRIAXone (ROCEPHIN) 1 g in dextrose 5 % 50 mL IVPB  Status:  Discontinued     1 g 100 mL/hr over 30 Minutes Intravenous Every 24 hours 11/02/13 0121 11/05/13 2209   11/01/13 2200  cephALEXin (KEFLEX) capsule 500 mg  Status:  Discontinued     500 mg Oral 2 times daily 11/01/13 2157 11/02/13 0121      Assessment/Plan: s/p Procedure(s): VIDEO ASSISTED THORACOSCOPY (Right) EVACUATION HEMATOMA  (Right)  Rt chest tube intact Minimal output TPA inj: 4 mg tpa in 20 cc saline Unclamp in 1 hr Will recheck in am   LOS: 11 days    Keola Heninger A 11/12/2013

## 2013-11-12 NOTE — Progress Notes (Signed)
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 The patient had a right subclavian triple-lumen catheter which has been removed since the bleeding occurred causing the tension hemothorax . he is currently supported with a right IJ and a right femoral venous catheter. Femoral  A-line has been placed for 6 days  Patient developed cardiac arrest from right hemo-thorax tension on right heart and has been intubated with ventilator-dependent respiratory failure for 6 days. The chest x-ray shows significant clearing after the procedures and drainage tubes were placed. His bronchial washings are negative and white count normal  The patient's LVAD function has been normal since evacuation of right hemothorax and he is maintained sinus rhythm. He is on heparin infusion while he is n.p.o.  The partial small bowel obstruction is resolved he currently has ileus. Attempt at placing feeding tube by interventional radiology was not successful for postpyloric positioning. The patient currently has a gastric ileus with retained contrast still in the stomach from the tube insertion  The patient's palmar he status has progressively improved after right VATS and subsequent pigtail catheter drainage and he was weaned and extubated today. He is breathing comfortably with a strong cough.  We'll plan on removing femoral A-line and femoral venous lines so patient may mobilize out of bed tomorrow. We'll start a swallowing evaluation tomorrow. Patient still has a significant colonic ileus receive another Fleet enema today  LVAD INTERROGATION HeartMate II LVAD:  Flow 4.0 liters/min, speed 9400 RPM, power 5.0, PI 5.5  1  PI  event   Objective:    Vital Signs:   Temp:  [97.8 F (36.6 C)-98.8 F (37.1 C)] 98 F (36.7 C) (03/15 1246) Pulse Rate:  [54-110] 103 (03/15 1401) Resp:  [10-27] 23 (03/15 1401) BP: (79-111)/(64-84) 107/83 mmHg (03/15 0420) SpO2:  [92 %-100 %] 96 % (03/15 1401) Arterial Line BP: (62-145)/(46-95) 99/81 mmHg (03/15 1400) FiO2 (%):  [40 %] 40 % (03/15 1000) Weight:  [157 lb 6.5 oz (71.4 kg)] 157 lb 6.5 oz (71.4 kg) (03/15 0415) Last BM Date: 11/11/13 Mean arterial Pressure  82 Intake/Output:   Intake/Output Summary (Last 24 hours) at 11/12/13 1507 Last data filed at 11/12/13 1400  Gross per 24 hour  Intake 3653.58 ml  Output   3181 ml  Net 472.58 ml     Physical Exam: General:  Well appearing. 9 extubated and responsive breathing comfortably  HEENT: normal Neck: supple. JVP . Carotids 2+ bilat; no bruits. No lymphadenopathy or thryomegaly appreciated. Cor: Mechanical heart sounds with LVAD hum present. Lungs: clear, Right chest tube present and right pigtail catheter present  Abdomen: soft, nontender, mildly distended. No hepatosplenomegaly. No bruits or masses. diminished  bowel sounds. Driveline: C/D/I; securement device intact and driveline incorporated Extremities: no cyanosis, clubbing, rash, mild edema, extremities warm  Neuro: Intact moves all extremities but weak  Telemetry: sinus rhythm  Labs: Basic Metabolic Panel:  Recent Labs Lab 11/07/13 0400 11/08/13 0415 11/08/13 1800 11/09/13 0400 11/10/13 0425 11/11/13 0408 11/12/13 0330  NA 138 137 140 140 137 137 135*  K 4.1 3.4* 3.5* 3.7 3.9 3.6* 3.7  CL 102 101 103 102 99 100 99  CO2 21 24 26 25 26 26 26   GLUCOSE 97 137* 126* 161* 143* 144* 158*  BUN 33* 31* 27* 26* 24* 20 21  CREATININE 1.22 1.26 1.25 1.20 1.04 0.88 0.86  CALCIUM 8.4 8.1* 8.0* 8.1* 8.0* 8.1* 8.2*  MG 1.7 2.0  --  2.0 2.1 2.1  --   PHOS 3.4 3.3  --  2.9 2.5 2.5  --     Liver Function Tests:  Recent Labs Lab 11/08/13 0415  11/09/13 0400 11/10/13 0425 11/11/13 0408 11/12/13 0330  AST 131* 59* 32 28 30  ALT 196* 145* 87* 64* 53  ALKPHOS 104 105 110 129* 147*  BILITOT 0.9 0.9 1.0 1.2 1.1  PROT 5.7* 6.3 5.8* 6.0 6.1  ALBUMIN 2.5* 2.5* 2.3* 2.3* 2.3*    Recent Labs Lab 11/09/13 1730  AMYLASE 24   No results found for this basename: AMMONIA,  in the last 168 hours  CBC:  Recent Labs Lab 11/08/13 0415 11/09/13 0400 11/10/13 0425 11/11/13 0408 11/12/13 0330  WBC 10.2 9.7 7.8 6.3 7.2  HGB 9.0* 9.6* 8.9* 9.1* 8.9*  HCT 27.1* 29.0* 27.4* 28.1* 27.1*  MCV 87.7 89.2 90.4 90.1 88.6  PLT 112* 133* 149* 143* 171    INR:  Recent Labs Lab 11/08/13 0415 11/09/13 0400 11/10/13 0425 11/11/13 0408 11/12/13 0330  INR 1.64* 1.34 1.31 1.19 1.16    Other results:  EKG:   Imaging: Dg Abd 1 View  11/10/2013   CLINICAL DATA:  Feeding tube placement  EXAM: ABDOMEN - 1 VIEW  COMPARISON:  DG ABD PORTABLE 1V dated 11/09/2013; DG ABD PORTABLE 1V dated 11/10/2013; DG ABD PORTABLE 1V dated 11/10/2013; CT IMAGE GUIDED DRAINAGE BY PERCUTANEOUS CATHETER dated 11/10/2013  FINDINGS: Two intra procedural fluoroscopic images were obtained as part of feeding tube placement by the technologist. These images demonstrate incomplete visualization of median sternotomy wires and pacer wires as well as a left ventricular assist device. The feeding tube is partly obscured by the left ventricular assist device, with contrast opacifying the stomach.  FLUOROSCOPY TIME:  6 min 51 seconds  IMPRESSION: Feeding tube placement with tip apparently within the stomach, although contrast is partly obscured by the left ventricular assist device and findings would be better confirmed at abdominal radiography. Abdominal radiographs were subsequently performed and reported separately on 11/10/2013. This exam from 11/09/2013 was presented for evaluation on 11/10/2013. Please see reports of subsequent imaging for follow-up.   Electronically Signed   By:  Conchita [place] M.D.   On: 11/10/2013 19:52   Dg Chest Port 1 View  11/12/2013   CLINICAL DATA:  Ventricular system lies  EXAM: PORTABLE CHEST - 1 VIEW  COMPARISON:  11/11/2013  FINDINGS: Cardiac shadow is stable. The left ventricular assist device is again noted. A defibrillator is again seen and stable. The endotracheal tube and feeding catheter are stable in appearance. A right jugular central venous line is again noted at the cavoatrial junction. The right upper lobe pleural drain is stable in appearance. Persistent density is noted in the right apex. A second right thoracostomy catheter is stable as well. The left lung remains clear.  IMPRESSION: No significant interval change from the prior exam.   Electronically Signed   By: Inez Catalina M.D.   On: 11/12/2013 07:27   Dg Chest Port 1 View  11/11/2013   CLINICAL DATA:  Check endotracheal tube position  EXAM: PORTABLE CHEST - 1 VIEW  COMPARISON:  11/10/2013  FINDINGS: Cardiac shadow is stable. A ventricular assist device is again identified and stable. A defibrillator is again noted stable. There is decreasing density  overlying the mid and upper right lung consistent with the previous the placed drainage catheter. Some kinking of the catheter is noted. Left lung is clear. An endotracheal tube is noted 2 points 6 cm above the carina and stable in appearance. A nasogastric catheter is been removed and apparent feeding catheter is now seen coursing into the stomach. Its tip is obscured by the ventricular assist device. A right-sided jugular central line is seen. A right-sided chest tube is again seen and stable.  IMPRESSION: Decrease in the right-sided pleural fluid secondary to catheter placement.  Tubes and lines as described.  No new focal abnormality is seen.   Electronically Signed   By: Inez Catalina M.D.   On: 11/11/2013 07:16   Dg Chest Port 1 View  11/10/2013   CLINICAL DATA:  Right chest tube.  EXAM: PORTABLE CHEST - 1 VIEW  COMPARISON:  Earlier  today.  FINDINGS: The right chest tube is unchanged. Stable right upper loculated pleural fluid. Decreased pleural fluid at the right lung base. Stable enlarged cardiac silhouette, median sternotomy wires and left subclavian AICD and pacer leads. Stable left ventricular assist device. The left lung remains clear. The endotracheal tube is in satisfactory position. Nasogastric tube extending into the mid to upper esophagus and curved back upon itself with its tip not included.  IMPRESSION: 1. Malpositioned nasogastric tube with its tip most likely in the oropharynx or nasopharynx. 2. Stable cardiomegaly and loculated right pleural fluid. 3. Decreased non loculated right pleural fluid.   Electronically Signed   By: Enrique Sack M.D.   On: 11/10/2013 20:32   Dg Abd Portable 1v  11/12/2013   CLINICAL DATA:  Ileus  EXAM: PORTABLE ABDOMEN - 1 VIEW  COMPARISON:  11/11/2013  FINDINGS: Contrast material is noted within the right colon. A right central venous line is noted in the femoral vein. No bony abnormality is seen. No obstructive changes are noted.  IMPRESSION: The overall appearance is similar to that seen on the prior exam. No obstructive changes are noted.   Electronically Signed   By: Inez Catalina M.D.   On: 11/12/2013 07:32   Dg Abd Portable 1v  11/11/2013   CLINICAL DATA:  Small bowel obstruction  EXAM: PORTABLE ABDOMEN - 1 VIEW  COMPARISON:  Abdomen radiograph 11/10/2013 and chest radiograph 11/11/2013  FINDINGS: Oral contrast material is seen in the visualized portion of the stomach, within nondilated small bowel loops, and throughout the majority of the colon. There is mild diffuse distention of the colon. Oral contrast is seen in the rectum. There are no dilated small bowel loops. Distal tip of a right-sided chest tube projects over the right upper quadrant. Left ventricular assist device projects over the left upper quadrant. Right femoral catheter terminates air S1 to the right of midline. Rectal  thermistor place.  IMPRESSION: Oral contrast is seen throughout the bowel to the level of the colon. No evidence of bowel obstruction. There is mild distention of the colon consistent with ileus.   Electronically Signed   By: Curlene Dolphin M.D.   On: 11/11/2013 09:49   Dg Abd Portable 1v  11/10/2013   CLINICAL DATA:  Feeding tube and nasogastric tube placement.  EXAM: PORTABLE ABDOMEN - 1 VIEW  COMPARISON:  Portable chest obtained at the same time.  FINDINGS: Previously described malpositioned nasogastric tube curved back upon itself in the mid esophagus. The tip is not included. Feeding tube extending to the gastroesophageal junction with its tip obscured by the left  ventricular assist device. Stable right chest tube with its tip overlying the mid liver on the right. Normal bowel gas pattern. Right femoral catheter tip in the region of the right common femoral vein. Splenic artery calcifications without aneurysm. Lower lumbar spine degenerative changes.  IMPRESSION: 1. Previously described malpositioned nasogastric tube with its tip most likely in the oropharynx or nasopharynx. 2. Feeding tube extending to gastroesophageal junction with the tube tip obscured by the left ventricular assist device. 3. Right chest tube tip overlying the right mid liver.   Electronically Signed   By: Enrique Sack M.D.   On: 11/10/2013 20:37   Dg L[place]ce Dys Tube Plc W/fl-no Rad  11/10/2013   CLINICAL DATA: need post pyloric feeding tube for malnutrition   NASO G TUBE PLACEMENT WITH FLUORO  Fluoroscopy was utilized by the requesting physician.  No radiographic  interpretation.      Medications:     Scheduled Medications: . antiseptic oral rinse  15 mL Mouth Rinse Q4H  . bisacodyl  10 mg Rectal Once  . chlorhexidine  15 mL Mouth Rinse BID  . insulin aspart  0-9 Units Subcutaneous 6 times per day  . levothyroxine  100 mcg Intravenous Daily  . metoCLOPramide (REGLAN) injection  10 mg Intravenous 4 times per day  .  pantoprazole (PROTONIX) IV  40 mg Intravenous Daily    Infusions: . sodium chloride 20 mL/hr at 11/11/13 0925  . dexmedetomidine Stopped (11/12/13 1125)  . Marland KitchenTPN (CLINIMIX-E) Adult 83 mL/hr at 11/11/13 1746   And  . fat emulsion 250 mL (11/11/13 1746)  . Marland KitchenTPN (CLINIMIX-E) Adult     And  . fat emulsion    . feeding supplement (VITAL AF 1.2 CAL)    . heparin 1,100 Units/hr (11/12/13 0726)  . norepinephrine (LEVOPHED) Adult infusion Stopped (11/12/13 1125)    PRN Medications: diphenhydrAMINE, fentaNYL, LORazepam, ondansetron (ZOFRAN) IV, phenol, sodium chloride   Assessment:   Now the patient is extubated will remove groin lines, and resume physical therapy to mobilize patient out of bed and start swallow assessment once his ileus improves for taking by mouth's and by mouth nutrition   Systemic anticoagulation with heparin, goal PTT 50-60  Continue systemic antibiotics complete 7-10 days for possible pneumonia Plan/Discussion:      I reviewed the LVAD parameters from today, and compared the results to the patient's prior recorded data.  No programming changes were made.  The LVAD is functioning within specified parameters.  The patient performs LVAD self-test daily.  LVAD interrogation was negative for any significant power changes, alarms or PI events/speed drops.  LVAD equipment check completed and is in good working order.  Back-up equipment present.   LVAD education done on emergency procedures and precautions and reviewed exit site care.  Length of Stay: 11  VAN TRIGT III,PETER 11/12/2013, 3:07 PM  VAD Team Pager [phone] (7am - 7am)

## 2013-11-12 NOTE — Progress Notes (Signed)
Patient has become short of breath, stridor present, currently on 6L [place] - sats low 90's. HR 100-110's, MAP 106. Dr. Alva Garnet and e-link MD have been notified. Will continue to monitor. Richardean Sale, RN

## 2013-11-12 NOTE — Progress Notes (Signed)
[facility] ADULT ICU REPLACEMENT PROTOCOL FOR AM LAB REPLACEMENT ONLY  The patient does apply for the [facility] Adult ICU Electrolyte Replacment Protocol based on the criteria listed below:   1. Is GFR >/= 40 ml/min? yes  Patient's GFR today is 86 2. Is urine output >/= 0.5 ml/kg/hr for the last 6 hours? yes Patient's UOP is 1.1 ml/kg/hr 3. Is BUN < 60 mg/dL? yes  Patient's BUN today is 21 4. Abnormal electrolyte(s): k+3.7 5. Ordered repletion with: protocol 6. If a panic level lab has been reported, has the CCM MD in charge been notified? yes.   Physician:  E Deterding  Concepcion Living [facility] 11/12/2013 5:16 AM

## 2013-11-12 NOTE — Progress Notes (Signed)
Patient c/o of tightness and feeling like he is unable to breath in air (specifically through his nose). He is maintaining his sats at 4L [place]. Inspiratory wheezes into upper airways with stridor. HR tachy into 110's. RT notified and will be seeing him at bedside. Will continue to monitor. Richardean Sale, RN

## 2013-11-12 NOTE — Progress Notes (Addendum)
Advanced Heart Failure Team Rounding Note     Subjective:    Johnny Oneill is a 73 year old patient with a history of CAD and HF s/p LVAD HMII 09/19/2013 currently [facility]plant list .   Admitted to [facility] 09/13/13 through 10/03/13. Initially admitted with decompenstaed heart failure and low output. Required IABP for stabilization. He ultimately had LVAD HM II placed on 09/19/13. He suffered from RV failure after the procedure and required milrinone for >7 days which was gradually weaned off. D/C weight 151 pounds. f  Admitted on 3/4 with SBO/ileus. Was improving then had cardiac arrest last night (3/8) with PEA in setting of R hemothorax. Now s/p R chest tube and multiple units transfusion. Underwent R VATs 3/9 then bronch on 3/10.    On 3/13 underwent placement of R chest drain by IR. Output has slowed down so TPA has been placed and now improved.. CXR unchanged. Extubated this am. Levophed down to 1. Awake. Conversant. +BM. Co-ox 67%  LVAD INTERROGATION:  HeartMate II LVAD: Flow 4.6 liters/min, speed 9400, power 5.3 PI 5.6. 1 PI event  I reviewed the LVAD parameters from today, and compared the results to the patient's prior recorded data. No programming changes were made. The LVAD is functioning within specified parameters. The patient performs LVAD self-test daily. LVAD interrogation was negative for any significant power changes, alarms or PI events/speed drops. LVAD equipment check completed and is in good working order. Back-up equipment present. LVAD education done on emergency procedures and precautions and reviewed exit site care.   CVP 7 Physical Exam: GENERAL: extubated. conversant HEENT: normal   NECK: Supple CARDIAC: Mechanical heart sounds with LVAD hum present.  LUNGS: R chest tube.. Decreased BS on R.  ABDOMEN: soft nontender,  Bowel sounds good LVAD exit site: well-healed and incorporated. Dressing dry and intact. No erythema or drainage. Stabilization device present and accurately  applied. Driveline dressing is being changed daily per sterile technique.  EXTREMITIES: Warm and dry, no cyanosis, clubbing, rash or edema  NEUROLOGIC: awake non-focal.  Telemetry: SR   Labs: Basic Metabolic Panel:  Recent Labs Lab 11/07/13 0400 11/08/13 0415 11/08/13 1800 11/09/13 0400 11/10/13 0425 11/11/13 0408 11/12/13 0330  NA 138 137 140 140 137 137 135*  K 4.1 3.4* 3.5* 3.7 3.9 3.6* 3.7  CL 102 101 103 102 99 100 99  CO2 21 24 26 25 26 26 26   GLUCOSE 97 137* 126* 161* 143* 144* 158*  BUN 33* 31* 27* 26* 24* 20 21  CREATININE 1.22 1.26 1.25 1.20 1.04 0.88 0.86  CALCIUM 8.4 8.1* 8.0* 8.1* 8.0* 8.1* 8.2*  MG 1.7 2.0  --  2.0 2.1 2.1  --   PHOS 3.4 3.3  --  2.9 2.5 2.5  --     Liver Function Tests:  Recent Labs Lab 11/08/13 0415 11/09/13 0400 11/10/13 0425 11/11/13 0408 11/12/13 0330  AST 131* 59* 32 28 30  ALT 196* 145* 87* 64* 53  ALKPHOS 104 105 110 129* 147*  BILITOT 0.9 0.9 1.0 1.2 1.1  PROT 5.7* 6.3 5.8* 6.0 6.1  ALBUMIN 2.5* 2.5* 2.3* 2.3* 2.3*    Recent Labs Lab 11/09/13 1730  AMYLASE 24   No results found for this basename: AMMONIA,  in the last 168 hours  CBC:  Recent Labs Lab 11/08/13 0415 11/09/13 0400 11/10/13 0425 11/11/13 0408 11/12/13 0330  WBC 10.2 9.7 7.8 6.3 7.2  HGB 9.0* 9.6* 8.9* 9.1* 8.9*  HCT 27.1* 29.0* 27.4* 28.1* 27.1*  MCV 87.7  89.2 90.4 90.1 88.6  PLT 112* 133* 149* 143* 171    Cardiac Enzymes: No results found for this basename: CKTOTAL, CKMB, CKMBINDEX, TROPONINI,  in the last 168 hours  BNP: BNP (last 3 results)  Recent Labs  09/13/13 1349 09/25/13 0400 11/01/13 1033  PROBNP 3883.0* 7019.0* 877.6*    CBG:  Recent Labs Lab 11/11/13 1540 11/11/13 1952 11/12/13 0004 11/12/13 0357 11/12/13 0727  GLUCAP 138* 114* 131* 146* 170*    Coagulation Studies:  Recent Labs  11/10/13 0425 11/11/13 0408 11/12/13 0330  LABPROT 16.0* 14.8 14.6  INR 1.31 1.19 1.16    Other  results:   Imaging: Dg Abd 1 View  11/10/2013   CLINICAL DATA:  Feeding tube placement  EXAM: ABDOMEN - 1 VIEW  COMPARISON:  DG ABD PORTABLE 1V dated 11/09/2013; DG ABD PORTABLE 1V dated 11/10/2013; DG ABD PORTABLE 1V dated 11/10/2013; CT IMAGE GUIDED DRAINAGE BY PERCUTANEOUS CATHETER dated 11/10/2013  FINDINGS: Two intra procedural fluoroscopic images were obtained as part of feeding tube placement by the technologist. These images demonstrate incomplete visualization of median sternotomy wires and pacer wires as well as a left ventricular assist device. The feeding tube is partly obscured by the left ventricular assist device, with contrast opacifying the stomach.  FLUOROSCOPY TIME:  6 min 51 seconds  IMPRESSION: Feeding tube placement with tip apparently within the stomach, although contrast is partly obscured by the left ventricular assist device and findings would be better confirmed at abdominal radiography. Abdominal radiographs were subsequently performed and reported separately on 11/10/2013. This exam from 11/09/2013 was presented for evaluation on 11/10/2013. Please see reports of subsequent imaging for follow-up.   Electronically Signed   By: Conchita Paris M.D.   On: 11/10/2013 19:52   Dg Chest Port 1 View  11/12/2013   CLINICAL DATA:  Ventricular system lies  EXAM: PORTABLE CHEST - 1 VIEW  COMPARISON:  11/11/2013  FINDINGS: Cardiac shadow is stable. The left ventricular assist device is again noted. A defibrillator is again seen and stable. The endotracheal tube and feeding catheter are stable in appearance. A right jugular central venous line is again noted at the cavoatrial junction. The right upper lobe pleural drain is stable in appearance. Persistent density is noted in the right apex. A second right thoracostomy catheter is stable as well. The left lung remains clear.  IMPRESSION: No significant interval change from the prior exam.   Electronically Signed   By: Inez Catalina M.D.   On:  11/12/2013 07:27   Dg Chest Port 1 View  11/11/2013   CLINICAL DATA:  Check endotracheal tube position  EXAM: PORTABLE CHEST - 1 VIEW  COMPARISON:  11/10/2013  FINDINGS: Cardiac shadow is stable. A ventricular assist device is again identified and stable. A defibrillator is again noted stable. There is decreasing density overlying the mid and upper right lung consistent with the previous the placed drainage catheter. Some kinking of the catheter is noted. Left lung is clear. An endotracheal tube is noted 2 points 6 cm above the carina and stable in appearance. A nasogastric catheter is been removed and apparent feeding catheter is now seen coursing into the stomach. Its tip is obscured by the ventricular assist device. A right-sided jugular central line is seen. A right-sided chest tube is again seen and stable.  IMPRESSION: Decrease in the right-sided pleural fluid secondary to catheter placement.  Tubes and lines as described.  No new focal abnormality is seen.   Electronically Signed  By: Inez Catalina M.D.   On: 11/11/2013 07:16   Dg Chest Port 1 View  11/10/2013   CLINICAL DATA:  Right chest tube.  EXAM: PORTABLE CHEST - 1 VIEW  COMPARISON:  Earlier today.  FINDINGS: The right chest tube is unchanged. Stable right upper loculated pleural fluid. Decreased pleural fluid at the right lung base. Stable enlarged cardiac silhouette, median sternotomy wires and left subclavian AICD and pacer leads. Stable left ventricular assist device. The left lung remains clear. The endotracheal tube is in satisfactory position. Nasogastric tube extending into the mid to upper esophagus and curved back upon itself with its tip not included.  IMPRESSION: 1. Malpositioned nasogastric tube with its tip most likely in the oropharynx or nasopharynx. 2. Stable cardiomegaly and loculated right pleural fluid. 3. Decreased non loculated right pleural fluid.   Electronically Signed   By: Enrique Sack M.D.   On: 11/10/2013 20:32   Dg  Abd Portable 1v  11/12/2013   CLINICAL DATA:  Ileus  EXAM: PORTABLE ABDOMEN - 1 VIEW  COMPARISON:  11/11/2013  FINDINGS: Contrast material is noted within the right colon. A right central venous line is noted in the femoral vein. No bony abnormality is seen. No obstructive changes are noted.  IMPRESSION: The overall appearance is similar to that seen on the prior exam. No obstructive changes are noted.   Electronically Signed   By: Inez Catalina M.D.   On: 11/12/2013 07:32   Dg Abd Portable 1v  11/11/2013   CLINICAL DATA:  Small bowel obstruction  EXAM: PORTABLE ABDOMEN - 1 VIEW  COMPARISON:  Abdomen radiograph 11/10/2013 and chest radiograph 11/11/2013  FINDINGS: Oral contrast material is seen in the visualized portion of the stomach, within nondilated small bowel loops, and throughout the majority of the colon. There is mild diffuse distention of the colon. Oral contrast is seen in the rectum. There are no dilated small bowel loops. Distal tip of a right-sided chest tube projects over the right upper quadrant. Left ventricular assist device projects over the left upper quadrant. Right femoral catheter terminates air S1 to the right of midline. Rectal thermistor place.  IMPRESSION: Oral contrast is seen throughout the bowel to the level of the colon. No evidence of bowel obstruction. There is mild distention of the colon consistent with ileus.   Electronically Signed   By: Curlene Dolphin M.D.   On: 11/11/2013 09:49   Dg Abd Portable 1v  11/10/2013   CLINICAL DATA:  Feeding tube and nasogastric tube placement.  EXAM: PORTABLE ABDOMEN - 1 VIEW  COMPARISON:  Portable chest obtained at the same time.  FINDINGS: Previously described malpositioned nasogastric tube curved back upon itself in the mid esophagus. The tip is not included. Feeding tube extending to the gastroesophageal junction with its tip obscured by the left ventricular assist device. Stable right chest tube with its tip overlying the mid liver on the  right. Normal bowel gas pattern. Right femoral catheter tip in the region of the right common femoral vein. Splenic artery calcifications without aneurysm. Lower lumbar spine degenerative changes.  IMPRESSION: 1. Previously described malpositioned nasogastric tube with its tip most likely in the oropharynx or nasopharynx. 2. Feeding tube extending to gastroesophageal junction with the tube tip obscured by the left ventricular assist device. 3. Right chest tube tip overlying the right mid liver.   Electronically Signed   By: Enrique Sack M.D.   On: 11/10/2013 20:37   Dg Addison Bailey G Tube Plc W/fl-no Rad  11/10/2013   CLINICAL DATA: need post pyloric feeding tube for malnutrition   NASO G TUBE PLACEMENT WITH FLUORO  Fluoroscopy was utilized by the requesting physician.  No radiographic  interpretation.    Ct Image Guided Drainage By Percutaneous Catheter  11/10/2013   ADDENDUM REPORT: 11/10/2013 17:11  ADDENDUM: An initial CT scan of the chest with contrast material was performed in the left lateral decubitus position to fully evaluate the complex right fluid collection an to serve as planning for the percutaneous intervention.  There is a complex loculated fluid collection at the right lung apex with layering hematocrit levels most suggestive of evolving hemothorax. There is a persistent small left pleural effusion. A basally directed right thoracostomy tube is in good position. There is minimal residual fluid in all inferior aspect of the lung base. No pulmonary edema. Atelectasis and likely an element of pleural parenchymal scarring is noted in the inferior lingula and left lower lobe. There is diffuse mild bronchial wall thickening. The heart is enlarged. A left ventricular assist device is present. No acute abnormality involving the device. The endotracheal tube is in good position. A right IJ central venous catheter terminates at the superior cavoatrial junction left subclavian approach cardiac rhythm maintenance  device with leads in the right atrium and right ventricular apex.  The visualized upper thorax is unremarkable. No acute osseous abnormality.   Electronically Signed   By: Jacqulynn Cadet M.D.   On: 11/10/2013 17:11   11/10/2013   CLINICAL DATA:  73 year old male with loculated right hemothorax. He underwent prior VATS with removal of blood from the mid and inferior pleural space. However, there is loculated fluid at the apex of the lung and the lung is tethered. CT-guided placement of a percutaneous drain is warranted in effort to remove the loculated blood products.  EXAM: CT GUIDED placement of a right-sided thoracostomy tube  ANESTHESIA/SEDATION: Precedex from ICU. In additional 60 mcg of fentanyl was administered intravenously.  Total monitored sedation time was 20 min.  PROCEDURE: The procedure, risks, benefits, and alternatives were explained to the patient. Questions regarding the procedure were encouraged and answered. The patient understands and consents to the procedure.  The patient was placed in the left lateral decubitus position. A CT scan of the chest with contrast material was performed. There is a loculated heterogeneous attenuation fluid in in the right lung apex partially compressing the right upper lobe. The appearance of the fluid is most consistent with evolving hemo thorax. A suitable skin entry site was selected and marked. Local anesthesia was attained by infiltration with 1% lidocaine. Under intermittent CT fluoroscopic guidance, a needle was directed over rib and into the apex of the pleural space.  Approximately 100 mL of thick, dark reddish brown bloody fluid was removed. The contents are consistent with old hematoma. A sample was selected and sent for culture. The tube was secured to the skin with 0 silk suture. An air tight gauze dressing was placed. The tube was connected to a Pleur-Evac at 20 cm water.  COMPLICATIONS: None immediate  FINDINGS: Loculated hematoma at the lung apex.   IMPRESSION: Successful placement of a 19 F Cook all-purpose drain yielding 100 mL of thick, dark old blood.  Recommend maintaining the tube to low wall suction. If there is insufficient drainage over the next 24 hr, we will consider serial TPA dwells in an effort to break up the residual blood products and facilitate drainage.  Signed,  Criselda Peaches, MD  [facility]  [facility]  Electronically Signed: By: Jacqulynn Cadet M.D. On: 11/10/2013 15:17     Assessment:   1. Hemorrhagic shock due to R hemothorax    --s/p R chest tube 3/8 2. 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 CAD 4. Hypothyroidism 5. SBO 6. Acute blood loss anemia 7. PEA arrest 8. Shock liver 9. A/C renal failure, stage III -resolved 10. Ventilatory-dependent respiratory failure 11. Hypokalemia  Plan/Discussion:    Overall much improved. Extubated. Bowel waking up. Weaning levophed (should be able to come off now that precedex is stopped. CVP is 7 so will hold lasix today.   He is s/p drainage tube placement to drain blood on surface of R lung. Drainage sluggish due to clot. IR has dwelled TPA today and is improving.  Nutrition per Dr. Prescott Gum.   Hgb stable. On heparin/coumadin. INR 1.1  VAD parameters look good.  The patient is critically ill with multiple organ systems failure and requires high complexity decision making for assessment and support, frequent evaluation and titration of therapies, application of advanced monitoring technologies and extensive interpretation of multiple databases.   Critical Care Time devoted to patient care services described in this note is 45 Minutes.  Length of Stay: 11 Glori Bickers MD 11/12/2013, 11:35 AM  Advanced Heart Failure Team Pager [phone] (M-F; [place])  Please contact [facility] for night-coverage after hours (4p -7a ) and weekends on [email or web address]

## 2013-11-12 NOTE — Progress Notes (Signed)
PULMONARY  / CRITICAL CARE MEDICINE  Name: Johnny Oneill MRN: [id number] DOB: 1941/01/16    ADMISSION DATE:  11/01/2013 CONSULTATION DATE:  3/8  PRIMARY SERVICE: CHF service  CHIEF COMPLAINT:  Cardiogenic Shock  BRIEF PATIENT DESCRIPTION: 17 male with LVAD implanted 09/19/2013 with onset of shock, likely due to hemorrhage, necessitating intubation  SIGNIFICANT EVENTS / STUDIES:  3/8 CXR with with complete opacification of right hemothorax 3/9 improvement of right hemothorax s/p chest tube.  ET tube in place 3/9 CT Chest abd pelvis: Ventricular assistance device and left-sided pacemaker are noted. Right-sided chest tube has been placed with distal tip directed toward posterior portion of chest. Large right pleural effusion is noted with high density material layering within it concerning for hemorrhage 3/13 Pigtail cath placement by IR to R apical fluid collection > bloody fluid removed  LINES / TUBES: R [place] CVL 3/5 >>  Right Femoral CVL 3/5>>>3/10 ETT 3/08 >> 3/15 R femoral A-line 3/08 >>   CULTURES: BAL 3/10>>>NTD Sputum 3/9>> NOF Blood 3/8>>>NTD  ANTIBIOTICS: Zosyn 3/5>>>3/15 Vancomcyin 3/5>> 3/15  SUBJECTIVE:  RASS 0. Passed SBT. Extubated and looks good post extubation initially  VITAL SIGNS: Temp:  [97.8 F (36.6 C)-98.8 F (37.1 C)] 98 F (36.7 C) (03/15 1246) Pulse Rate:  [54-110] 84 (03/15 1500) Resp:  [10-27] 15 (03/15 1500) BP: (79-111)/(64-84) 107/83 mmHg (03/15 0420) SpO2:  [92 %-100 %] 98 % (03/15 1500) Arterial Line BP: (62-145)/(46-95) 75/65 mmHg (03/15 1500) FiO2 (%):  [40 %] 40 % (03/15 1000) Weight:  [71.4 kg (157 lb 6.5 oz)] 71.4 kg (157 lb 6.5 oz) (03/15 0415)  HEMODYNAMICS: CVP:  [8 mmHg-49 mmHg] 11 mmHg  VENTILATOR SETTINGS: Vent Mode:  [-] CPAP;PSV FiO2 (%):  [40 %] 40 % Set Rate:  [14 bmp] 14 bmp Vt Set:  [500 mL] 500 mL PEEP:  [5 cmH20] 5 cmH20 Pressure Support:  [14 cmH20] 14 cmH20 Plateau Pressure:  [16 cmH20-21 cmH20] 17  cmH20  INTAKE / OUTPUT: Intake/Output     03/14 0701 - 03/15 0700 03/15 0701 - 03/16 0700   I.V. (mL/kg) 1131.1 (15.8) 126.7 (1.8)   NG/GT 100 60   IV Piggyback 550 62.5   TPN 2232 744   Total Intake(mL/kg) 4013.1 (56.2) 993.2 (13.9)   Urine (mL/kg/hr) 2635 (1.5) 870 (1.4)   Drains 30 (0)    Stool 1 (0) 1 (0)   Chest Tube 30 (0)    Total Output 2696 871   Net +1317.1 +122.2         PHYSICAL EXAMINATION: General: NAD. Neuro: no focal deficits HEENT: WNL Cardiovascular: RRR, Nl S1/S2, -M/R/G. Lungs: Coarse BS diffusely.  Abdomen: Soft, NT, ND and +BS. Musculoskeletal:  No lower extremity edema, unable to palpate pulses Skin:  Pale, no ecchymosis, no evidence of retropertoneal bleed  LABS: I have reviewed all of today's lab results. Relevant abnormalities are discussed in the A/P section   CXR: [place] GGO in RUL area. No overt edema    ASSESSMENT / PLAN:  PULMONARY A:  Respiratory Failure Hemothorax  P:   Extubate  Supplemental O2 Monitor in ICU post extubation  CARDIOVASCULAR A: Cardiogenic/Hemorrhagic Shock - resolving LVAD in place P:  Mgmt per Cards/TCTS  RENAL A:  AKI, resolved.  P: Monitor BMET intermittently Monitor I/Os Correct electrolytes as indicated   GASTROINTESTINAL A:  No issues P:   SUP: PPI NPO post extubation  HEMATOLOGIC A: Acute blood loss anemia - no further bleeding noted P:  Mgmt per Cards  and TCTS  INFECTIOUS A:  Doubt sepsis P:   Micro and abx as above DC abx 3/15  ENDOCRINE A: Hyperglycemia P:   Cont SSI  NEUROLOGIC A: ICU associated pain/[place] P:   Cont current Rx  I have personally obtained a history, examined the patient, evaluated laboratory and imaging results, formulated the assessment and plan and placed orders.  CRITICAL CARE: The patient is critically ill with multiple organ systems failure and requires high complexity decision making for assessment and support, frequent evaluation and  titration of therapies, application of advanced monitoring technologies and extensive interpretation of multiple databases. Critical Care Time devoted to patient care services described in this note is 30 minutes.   Discussed with Dr Prescott Gum and Dr Haroldine Laws  Merton Border, MD ; [facility] [phone].  After 5:30 PM or weekends, call [phone]

## 2013-11-12 NOTE — Procedures (Signed)
Extubation Procedure Note  Patient Details:   Name: Johnny Oneill DOB: 1941-01-06 MRN: [id number]   Airway Documentation:  Airway 7.5 mm (Active)  Secured at (cm) 25 cm 11/12/2013  9:31 AM  Measured From Lips 11/12/2013  9:31 AM  Secured Location Right 11/12/2013  9:31 AM  Secured By [employer] 11/12/2013  9:31 AM  Tube Holder Repositioned Yes 11/12/2013  8:00 AM  Cuff Pressure (cm H2O) 24 cm H2O 11/08/2013  7:50 PM  Site Condition Dry 11/12/2013  8:00 AM    Evaluation  O2 sats: stable throughout Complications: No apparent complications Patient did tolerate procedure well. Bilateral Breath Sounds: Clear;Diminished Suctioning: Airway Yes Extubated PT to 2l [name]. PT was able to speak and cough.  [name], [name] 11/12/2013, 11:32 AM

## 2013-11-12 NOTE — Progress Notes (Signed)
crystPARENTERAL NUTRITION CONSULT NOTE: F/U  Pharmacy Consult:  TPN Indication: Ileus  Allergies  Allergen Reactions  . Diovan [Valsartan] Other (See Comments)    Hypotension at low dose, hyperkalemia  . Lipitor [Atorvastatin Calcium] Other (See Comments)    Muscle pain    Patient Measurements: Height: 5\' 9"  (175.3 cm) Weight: 157 lb 6.5 oz (71.4 kg) IBW/kg (Calculated) : 70.7 Usual Weight: 68 kg  Vital Signs: Temp: 97.8 F (36.6 C) (03/15 0729) Temp src: Oral (03/15 0729) BP: 107/83 mmHg (03/15 0420) Pulse Rate: 57 (03/15 0739) Intake/Output from previous day: 03/14 0701 - 03/15 0700 In: 4013.1 [I.V.:1131.1; NG/GT:100; IV Piggyback:550; TPN:2232] Out: 2696 [Urine:2635; Drains:30; Stool:1; Chest Tube:30]  Labs:  Recent Labs  11/10/13 0425  11/11/13 0408 11/11/13 1736 11/12/13 0330  WBC 7.8  --  6.3  --  7.2  HGB 8.9*  --  9.1*  --  8.9*  HCT 27.4*  --  28.1*  --  27.1*  PLT 149*  --  143*  --  171  APTT 48*  < > 51* 52* 53*  INR 1.31  --  1.19  --  1.16  < > = values in this interval not displayed.   Recent Labs  11/10/13 0425 11/11/13 0408 11/12/13 0330  NA 137 137 135*  K 3.9 3.6* 3.7  CL 99 100 99  CO2 26 26 26   GLUCOSE 143* 144* 158*  BUN 24* 20 21  CREATININE 1.04 0.88 0.86  CALCIUM 8.0* 8.1* 8.2*  MG 2.1 2.1  --   PHOS 2.5 2.5  --   PROT 5.8* 6.0 6.1  ALBUMIN 2.3* 2.3* 2.3*  AST 32 28 30  ALT 87* 64* 53  ALKPHOS 110 129* 147*  BILITOT 1.0 1.2 1.1   Estimated Creatinine Clearance: 79.9 ml/min (by C-G formula based on Cr of 0.86).    Recent Labs  11/11/13 1952 11/12/13 0004 11/12/13 0357  GLUCAP 114* 131* 146*     Insulin Requirements in the past 24 hours:  12 unit sensitive SSI/24h with CBGs 114-150/24h  Assessment: 70 YOM with history of recent LVAD and discharged on 09/19/13.  Patient returned with N/V and CT scan revealed SBO.   GI:  SBO believed to have resolved. Had flatus and several BMs. TPN d/c'd 3/7. Resumed TPN 3/10  for inability to take po's/TFs and high NG output. KUB=Ileus with last BM noted 3/14. Prealbumin 10.4. IV Reglan QID, IV PPI. Panda placed but MD notes inadequate feeding tube position.  Endo: hypothyroid on Synthroid (dose increased).  No hx DM - CBGs controlled on SSI  Lytes: K 3.7 (goal>4 with ileus), Kruns x 2 per protocol  Renal: Scr 0.86 with UOP 1.5 good  Cards:  s/p LVAD 09/19/13 -Cardiac arrest 3/8 with PEA with R hemothorax. Cardiogenic/hemorrhagic shock. 3/9: right VATS and evacuation of hematoma-hemothorax. Levophed drip weaning down. HR 57  Hepatobil: Shock liver s/p PEA arrest 3/8. Tbili 1.0 with elevated LFTs normalized. INR 1.16 back down.  Heme: Hgb 8.9. Plts 171  Resp: VDRF. Has R-sided chest tube and large R pleural effusion concerning for hemorrhage (on 3/9 CT). Chest tube with 30 output. Now on Precedex. FiO2 40%.  ID: Vanco/Zosyn for PNA. WBC 7.2. Afebrile.  Anticoag: LVAD. Coumadin PTA with INR 3.5 upon admit. Reversed. Heparin drip for LVAD at 1168ml/hr per MD (goal ptt 50-60). INR down to 1.16 from shock liver.  Best Practices: PPI IV, MC,  TPN Access: CVC placed 11/02/13  TPN day#: 3/5-3/8, resume 3/10  Current Nutrition:  TPN at goal 55ml/hr = 1894kcal and 99.6g protein  Nutritional Goals:  1750-1850 kcal and 105-115g protein per RD note 3/10.   Plan:  - K runs x 2 - Clinimix E at 96ml/hr. Lipids 20% 38ml/hr - Daily IV multivitamin in TPN - Trace elements on M/W/F due to [employer] S. Alford Highland, PharmD, [facility] Clinical Staff Pharmacist Pager [phone]  11/12/2013, 7:59 AM

## 2013-11-13 ENCOUNTER — Inpatient Hospital Stay (HOSPITAL_COMMUNITY): Payer: Medicare Other

## 2013-11-13 LAB — GLUCOSE, CAPILLARY
Glucose-Capillary: 153 mg/dL — ABNORMAL HIGH (ref 70–99)
Glucose-Capillary: 152 mg/dL — ABNORMAL HIGH (ref 70–99)
Glucose-Capillary: 118 mg/dL — ABNORMAL HIGH (ref 70–99)
Glucose-Capillary: 143 mg/dL — ABNORMAL HIGH (ref 70–99)

## 2013-11-13 LAB — CARBOXYHEMOGLOBIN
Carboxyhemoglobin: 1.5 % (ref 0.5–1.5)
Carboxyhemoglobin: 1.5 % (ref 0.5–1.5)
Methemoglobin: 1.3 % (ref 0.0–1.5)
Methemoglobin: 0.6 % (ref 0.0–1.5)
O2 Saturation: 46.7 %
O2 Saturation: 71.1 %
Total hemoglobin: 9.6 g/dL — ABNORMAL LOW (ref 13.5–18.0)
Total hemoglobin: 9.2 g/dL — ABNORMAL LOW (ref 13.5–18.0)

## 2013-11-13 LAB — CBC
HCT: 28.7 % — ABNORMAL LOW (ref 39.0–52.0)
Hemoglobin: 9.3 g/dL — ABNORMAL LOW (ref 13.0–17.0)
MCH: 28.9 pg (ref 26.0–34.0)
MCHC: 32.4 g/dL (ref 30.0–36.0)
MCV: 89.1 fL (ref 78.0–100.0)
Platelets: 224 10*3/uL (ref 150–400)
RBC: 3.22 MIL/uL — ABNORMAL LOW (ref 4.22–5.81)
RDW: 16.3 % — ABNORMAL HIGH (ref 11.5–15.5)
WBC: 12.2 10*3/uL — ABNORMAL HIGH (ref 4.0–10.5)

## 2013-11-13 LAB — COMPREHENSIVE METABOLIC PANEL
ALT: 50 U/L (ref 0–53)
AST: 31 U/L (ref 0–37)
Albumin: 2.6 g/dL — ABNORMAL LOW (ref 3.5–5.2)
Alkaline Phosphatase: 152 U/L — ABNORMAL HIGH (ref 39–117)
BUN: 23 mg/dL (ref 6–23)
CO2: 23 mEq/L (ref 19–32)
Calcium: 8.5 mg/dL (ref 8.4–10.5)
Chloride: 98 mEq/L (ref 96–112)
Creatinine, Ser: 0.87 mg/dL (ref 0.50–1.35)
GFR calc Af Amer: >90 mL/min (ref >90)
GFR calc non Af Amer: 85 mL/min — ABNORMAL LOW (ref >90)
Glucose, Bld: 163 mg/dL — ABNORMAL HIGH (ref 70–99)
Potassium: 4.1 mEq/L (ref 3.7–5.3)
Sodium: 134 mEq/L — ABNORMAL LOW (ref 137–147)
Total Bilirubin: 1.1 mg/dL (ref 0.3–1.2)
Total Protein: 6.7 g/dL (ref 6.0–8.3)

## 2013-11-13 LAB — PROTIME-INR
INR: 1.22 (ref 0.00–1.49)
Prothrombin Time: 15.1 seconds (ref 11.6–15.2)

## 2013-11-13 LAB — PHOSPHORUS: Phosphorus: 3 mg/dL (ref 2.3–4.6)

## 2013-11-13 LAB — MAGNESIUM: Magnesium: 2.1 mg/dL (ref 1.5–2.5)

## 2013-11-13 LAB — POCT I-STAT 3, ART BLOOD GAS (G3+)
Bicarbonate: 24.1 mEq/L — ABNORMAL HIGH (ref 20.0–24.0)
O2 Saturation: 88 %
Patient temperature: 97.9
TCO2: 25 mmol/L (ref 0–100)
pCO2 arterial: 36 mmHg (ref 35.0–45.0)
pH, Arterial: 7.432 (ref 7.350–7.450)
pO2, Arterial: 51 mmHg — ABNORMAL LOW (ref 80.0–100.0)

## 2013-11-13 LAB — APTT
aPTT: 56 seconds — ABNORMAL HIGH (ref 24–37)
aPTT: 54 seconds — ABNORMAL HIGH (ref 24–37)

## 2013-11-13 MED ORDER — WARFARIN SODIUM 5 MG PO TABS
5.0000 mg | ORAL_TABLET | Freq: Once | ORAL | Status: AC
  Administered 2013-11-13: 5 mg via ORAL
  Filled 2013-11-13: qty 1

## 2013-11-13 MED ORDER — CLINIMIX E/DEXTROSE (5/15) 5 % IV SOLN
INTRAVENOUS | Status: AC
  Administered 2013-11-13: 18:00:00 via INTRAVENOUS
  Filled 2013-11-13: qty 2000

## 2013-11-13 MED ORDER — ALTEPLASE 100 MG IV SOLR
4.0000 mg | Freq: Once | INTRAVENOUS | Status: AC
  Administered 2013-11-13: 4 mg
  Filled 2013-11-13: qty 4

## 2013-11-13 MED ORDER — WARFARIN - PHARMACIST DOSING INPATIENT
Freq: Every day | Status: DC
  Administered 2013-11-13: 18:00:00

## 2013-11-13 MED ORDER — BISACODYL 10 MG RE SUPP
10.0000 mg | Freq: Once | RECTAL | Status: AC
  Administered 2013-11-13: 10 mg via RECTAL
  Filled 2013-11-13: qty 1

## 2013-11-13 MED ORDER — FUROSEMIDE 10 MG/ML IJ SOLN
40.0000 mg | Freq: Once | INTRAMUSCULAR | Status: AC
  Administered 2013-11-13: 40 mg via INTRAVENOUS
  Filled 2013-11-13: qty 4

## 2013-11-13 MED ORDER — MILRINONE IN DEXTROSE 20 MG/100ML IV SOLN: 0.1250 ug/kg/min | INTRAVENOUS | Status: DC

## 2013-11-13 MED ORDER — FAT EMULSION 20 % IV EMUL
240.0000 mL | INTRAVENOUS | Status: AC
  Administered 2013-11-13: 240 mL via INTRAVENOUS
  Filled 2013-11-13: qty 250

## 2013-11-13 NOTE — Progress Notes (Signed)
Physical Therapy Treatment Patient Details Name: Johnny Oneill MRN: [id number] DOB: March 25, 1941 Today's Date: 11/13/2013 Time: [id number] PT Time Calculation (min): 10 min  PT Assessment / Plan / Recommendation  History of Present Illness Johnny Oneill is a 73 year old male with history of CAD s/p stent to LCX 2004 with re-occlusion of LCX with lateral MI 2009 and repeat stenting, total occlusion of RCA, 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 S/P L TKR 2013. Last Cath 2009..  [name] in Jan. 2014.  Now returns with vomiting weakness.  pt s/p VATS for hematoma evac and with postop respiratory complications and vent.  Extubation 3/15.   PT Comments   Gave the support pt needed to be able to work on balance and transfers.  Very weak at this point.  Follow Up Recommendations  Home health PT     Does the patient have the potential to tolerate intense rehabilitation     Barriers to Discharge        Equipment Recommendations  None recommended by PT    Recommendations for Other Services    Frequency Min 3X/week   Progress towards PT Goals Progress towards PT goals: Progressing toward goals  Plan Current plan remains appropriate    Precautions / Restrictions Precautions Precautions: Fall Precaution Comments: recent LVAD placement Restrictions Weight Bearing Restrictions: No   Pertinent Vitals/Pain vss    Mobility  Bed Mobility Overal bed mobility: Needs Assistance Bed Mobility: Supine to Sit Supine to sit: Mod assist;+2 for physical assistance;HOB elevated General bed mobility comments: cues for sequencing; truncal assist Transfers Overall transfer level: Needs assistance Transfers: Sit to/from Stand;Stand Pivot Transfers Sit to Stand: +2 safety/equipment;Mod assist Stand pivot transfers: Mod assist;+2 safety/equipment General transfer comment: cues for sequencing.   significant stability assist and help advancing feet to pivot, but better than approx 1 hour ago.    Exercises     PT Diagnosis: Difficulty walking;Generalized weakness  PT Problem List: Decreased strength;Decreased activity tolerance;Decreased balance;Decreased mobility PT Treatment Interventions: DME instruction;Gait training;Functional mobility training;Therapeutic activities;Balance training;Patient/family education   PT Goals (current goals can now be found in the care plan section) Acute Rehab PT Goals Patient Stated Goal: back home PT Goal Formulation: With patient Time For Goal Achievement: 11/13/13 Potential to Achieve Goals: Good  Visit Information  Last PT Received On: 11/13/13 Assistance Needed: +2 History of Present Illness: Johnny Oneill is a 73 year old male with history of CAD s/p stent to LCX 2004 with re-occlusion of LCX with lateral MI 2009 and repeat stenting, total occlusion of RCA, 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 S/P L TKR 2013. Last Cath 2009..  [name] in Jan. 2014.  Now returns with vomiting weakness.  pt s/p VATS for hematoma evac and with postop respiratory complications and vent.  Extubation 3/15.    Subjective Data  Subjective: I need the pan for nausea. Patient Stated Goal: back home   Cognition  Cognition Arousal/Alertness: Lethargic Behavior During Therapy: Flat affect Overall Cognitive Status: Within Functional Limits for tasks assessed    Balance  Balance Overall balance assessment: Needs assistance Sitting-balance support: Feet supported;Single extremity supported;Bilateral upper extremity supported Sitting balance-Leahy Scale: Poor Sitting balance - Comments: tendency to list posteriorly Standing balance support: Bilateral upper extremity supported Standing balance-Leahy Scale: Poor Standing balance comment: moderate list  posteriorly  End of Session PT - End of Session Equipment Utilized During Treatment: Oxygen Activity Tolerance: Patient tolerated treatment well;Patient limited by fatigue Patient left: in chair;with call bell/phone within reach Nurse Communication: Mobility status   GP     Johnny Oneill, Johnny Oneill 11/13/2013, 1:06 PM

## 2013-11-13 NOTE — Progress Notes (Signed)
IS performed by pt with decent effort for max of 500. Done 5 times. RT and RN will continue to work with IS.

## 2013-11-13 NOTE — Progress Notes (Signed)
Pt needing reminders to keep O2 mask on.

## 2013-11-13 NOTE — Progress Notes (Signed)
Advanced Heart Failure Team Rounding Note     Subjective:    [name] is a 73 year old patient with a history of CAD and HF s/p LVAD HMII 09/19/2013 currently [facility] Transplant list .   Admitted to [facility] 09/13/13 through 10/03/13. Initially admitted with decompenstaed heart failure and low output. Required IABP for stabilization. He ultimately had LVAD HM II placed on 09/19/13. He suffered from RV failure after the procedure and required milrinone for >7 days which was gradually weaned off. D/C weight 151 pounds. f  Admitted on 3/4 with SBO/ileus. Was improving then had cardiac arrest last night (3/8) with PEA in setting of R hemothorax. Now s/p R chest tube and multiple units transfusion. Underwent R VATs 3/9 then bronch on 3/10. On 3/13 underwent placement of R chest drain by IR. Output has slowed down so TPA has been placed and now improved.   Had some resp distress last night. Now on NRB. Feels OK. Off levophed. 2 BMs overnight. Co-ox 47% (but arterial sat 88% so corrects to 57%)  LVAD INTERROGATION:  HeartMate II LVAD: Flow 4.3 liters/min, speed 9400, power 6.5 PI 7.1 No PI event  I reviewed the LVAD parameters from today, and compared the results to the patient's prior recorded data. No programming changes were made. The LVAD is functioning within specified parameters. The patient performs LVAD self-test daily. LVAD interrogation was negative for any significant power changes, alarms or PI events/speed drops. LVAD equipment check completed and is in good working order. Back-up equipment present. LVAD education done on emergency procedures and precautions and reviewed exit site care.   CVP 10-13 Physical Exam: GENERAL: extubated. conversant HEENT: normal   NECK: Supple CARDIAC: Mechanical heart sounds with LVAD hum present.  LUNGS: R chest tube.. Decreased BS on R.  ABDOMEN: soft nontender,  Bowel sounds good LVAD exit site: well-healed and incorporated. Dressing dry and intact. No erythema or  drainage. Stabilization device present and accurately applied. Driveline dressing is being changed daily per sterile technique.  EXTREMITIES: Warm and dry, no cyanosis, clubbing, rash or edema  NEUROLOGIC: awake non-focal.  Telemetry: SR   Labs: Basic Metabolic Panel:  Recent Labs Lab 11/08/13 0415  11/09/13 0400 11/10/13 0425 11/11/13 0408 11/12/13 0330 11/13/13 0400  NA 137  < > 140 137 137 135* 134*  K 3.4*  < > 3.7 3.9 3.6* 3.7 4.1  CL 101  < > 102 99 100 99 98  CO2 24  < > 25 26 26 26 23   GLUCOSE 137*  < > 161* 143* 144* 158* 163*  BUN 31*  < > 26* 24* 20 21 23   CREATININE 1.26  < > 1.20 1.04 0.88 0.86 0.87  CALCIUM 8.1*  < > 8.1* 8.0* 8.1* 8.2* 8.5  MG 2.0  --  2.0 2.1 2.1  --  2.1  PHOS 3.3  --  2.9 2.5 2.5  --  3.0  < > = values in this interval not displayed.  Liver Function Tests:  Recent Labs Lab 11/09/13 0400 11/10/13 0425 11/11/13 0408 11/12/13 0330 11/13/13 0400  AST 59* 32 28 30 31   ALT 145* 87* 64* 53 50  ALKPHOS 105 110 129* 147* 152*  BILITOT 0.9 1.0 1.2 1.1 1.1  PROT 6.3 5.8* 6.0 6.1 6.7  ALBUMIN 2.5* 2.3* 2.3* 2.3* 2.6*    Recent Labs Lab 11/09/13 1730  AMYLASE 24   No results found for this basename: AMMONIA,  in the last 168 hours  CBC:  Recent Labs Lab  11/09/13 0400 11/10/13 0425 11/11/13 0408 11/12/13 0330 11/13/13 0400  WBC 9.7 7.8 6.3 7.2 12.2*  HGB 9.6* 8.9* 9.1* 8.9* 9.3*  HCT 29.0* 27.4* 28.1* 27.1* 28.7*  MCV 89.2 90.4 90.1 88.6 89.1  PLT 133* 149* 143* 171 224    Cardiac Enzymes: No results found for this basename: CKTOTAL, CKMB, CKMBINDEX, TROPONINI,  in the last 168 hours  BNP: BNP (last 3 results)  Recent Labs  09/13/13 1349 09/25/13 0400 11/01/13 1033  PROBNP 3883.0* 7019.0* 877.6*    CBG:  Recent Labs Lab 11/12/13 0357 11/12/13 0727 11/12/13 1228 11/12/13 1930 11/12/13 2343  GLUCAP 146* 170* 112* 143* 153*    Coagulation Studies:  Recent Labs  11/11/13 0408 11/12/13 0330  11/13/13 0400  LABPROT 14.8 14.6 15.1  INR 1.19 1.16 1.22    Other results:   Imaging: Dg Chest Port 1 View  11/13/2013   CLINICAL DATA:  Left ventricular assist device.  EXAM: PORTABLE CHEST - 1 VIEW  COMPARISON:  11/12/2013  FINDINGS: Again noted is a pigtail drain within the right chest. There are persistent pleural and parenchymal densities throughout the right chest. Minimal change from the previous examination. No evidence for a large pneumothorax. Enlarged interstitial lung densities suggest vascular congestion or mild edema. Stable appearance of the cardiac ICD. Central venous catheter tip at the superior cavoatrial junction. Heart size is upper limits of normal and median sternotomy wires are present. The endotracheal tube and feeding tube have been removed. Again noted is a left ventricular assist device.  IMPRESSION: Stable pleural and parenchymal densities in the right chest. A small bore chest tube is present and there is no evidence for a large pneumothorax.  Vascular congestion or interstitial pulmonary edema.  Support apparatuses as described.   Electronically Signed   By: Markus Daft M.D.   On: 11/13/2013 07:29   Dg Chest Port 1 View  11/12/2013   CLINICAL DATA:  Ventricular system lies  EXAM: PORTABLE CHEST - 1 VIEW  COMPARISON:  11/11/2013  FINDINGS: Cardiac shadow is stable. The left ventricular assist device is again noted. A defibrillator is again seen and stable. The endotracheal tube and feeding catheter are stable in appearance. A right jugular central venous line is again noted at the cavoatrial junction. The right upper lobe pleural drain is stable in appearance. Persistent density is noted in the right apex. A second right thoracostomy catheter is stable as well. The left lung remains clear.  IMPRESSION: No significant interval change from the prior exam.   Electronically Signed   By: Inez Catalina M.D.   On: 11/12/2013 07:27   Dg Abd Portable 1v  11/13/2013   CLINICAL  DATA:  Evaluate ileus.  EXAM: PORTABLE ABDOMEN - 1 VIEW  COMPARISON:  DG ABD PORTABLE 1V dated 11/12/2013; DG ABD PORTABLE 1V dated 11/11/2013; DG CHEST 1V PORT dated 11/13/2013  FINDINGS: Single supine view of the abdomen was obtained. Evidence for a rectal tube or probe. Some of the contrast has advanced from the right colon to the pelvis and transverse colon area. There are gas-filled loops of small and large bowel. There is persistent dilatation of the right colon and cecal region.  IMPRESSION: Gas-filled loops of small and large bowel. Findings could be associated with an adynamic ileus. There has been mild progression of the oral contrast.   Electronically Signed   By: Markus Daft M.D.   On: 11/13/2013 07:36   Dg Abd Portable 1v  11/12/2013   CLINICAL DATA:  Ileus  EXAM: PORTABLE ABDOMEN - 1 VIEW  COMPARISON:  11/11/2013  FINDINGS: Contrast material is noted within the right colon. A right central venous line is noted in the femoral vein. No bony abnormality is seen. No obstructive changes are noted.  IMPRESSION: The overall appearance is similar to that seen on the prior exam. No obstructive changes are noted.   Electronically Signed   By: Inez Catalina M.D.   On: 11/12/2013 07:32   Dg Abd Portable 1v  11/11/2013   CLINICAL DATA:  Small bowel obstruction  EXAM: PORTABLE ABDOMEN - 1 VIEW  COMPARISON:  Abdomen radiograph 11/10/2013 and chest radiograph 11/11/2013  FINDINGS: Oral contrast material is seen in the visualized portion of the stomach, within nondilated small bowel loops, and throughout the majority of the colon. There is mild diffuse distention of the colon. Oral contrast is seen in the rectum. There are no dilated small bowel loops. Distal tip of a right-sided chest tube projects over the right upper quadrant. Left ventricular assist device projects over the left upper quadrant. Right femoral catheter terminates air S1 to the right of midline. Rectal thermistor place.  IMPRESSION: Oral contrast is  seen throughout the bowel to the level of the colon. No evidence of bowel obstruction. There is mild distention of the colon consistent with ileus.   Electronically Signed   By: Curlene Dolphin M.D.   On: 11/11/2013 09:49     Assessment:   1. Hemorrhagic shock due to R hemothorax    --s/p R chest tube 3/8 2. 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 CAD 4. Hypothyroidism 5. SBO 6. Acute blood loss anemia 7. PEA arrest 8. Shock liver 9. A/C renal failure, stage III -resolved 10. Ventilatory-dependent respiratory failure 11. Hypokalemia  Plan/Discussion:    Overall improved. But still tenuous. Off pressors. Co-ox artificially low due to low arterial sats. Will recheck before starting milrinone. Right heart looked pretty good on echo. CVP 10-12 will give lasix 40 IV today. MAPs running slightly high. Should improve with diuresis and improved resp status.   R chest clot improving on CXR. Will ask IR to TPA pigtail drain again.   Ileus improving slowly but belly more distended today.. Continue TPN for now. Will ambulate.   Hgb stable. On heparin/ Will start coumadin INR 1.1  VAD parameters look good.  The patient is critically ill with multiple organ systems failure and requires high complexity decision making for assessment and support, frequent evaluation and titration of therapies, application of advanced monitoring technologies and extensive interpretation of multiple databases.   Critical Care Time devoted to patient care services described in this note is 45 Minutes.  Length of Stay: 12 Glori Bickers MD 11/13/2013, 8:10 AM  Advanced Heart Failure Team Pager [phone] (M-F; [place])  Please contact [facility] for night-coverage after hours (4p -7a ) and weekends on [email or web address]

## 2013-11-13 NOTE — Progress Notes (Signed)
Pt needing reminders to keep O2 mask on

## 2013-11-13 NOTE — Progress Notes (Signed)
7 Days Post-Op  Subjective: Pt doing ok; breathing slightly improved; still weak  Objective: Vital signs in last 24 hours: Temp:  [97.4 F (36.3 C)-98 F (36.7 C)] 97.4 F (36.3 C) (03/16 0835) Pulse Rate:  [58-110] 92 (03/16 0800) Resp:  [10-34] 24 (03/16 0800) BP: (100)/(0) 100/0 mmHg (03/16 0800) SpO2:  [89 %-100 %] 100 % (03/16 0800) Arterial Line BP: (75-145)/(62-95) 116/89 mmHg (03/16 0400) FiO2 (%):  [50 %-80 %] 80 % (03/16 0800) Weight:  [154 lb 1.6 oz (69.9 kg)] 154 lb 1.6 oz (69.9 kg) (03/16 0500) Last BM Date: 11/12/13  Intake/Output from previous day: 03/15 0701 - 03/16 0700 In: 2697.2 [I.V.:312.7; NG/GT:90; IV Piggyback:62.5; [id number] Out: 2448 [Urine:2130; Drains:186; [place]; Chest Tube:130] Intake/Output this shift: Total I/O In: 210 [I.V.:24; TPN:186] Out: 225 [Urine:225]  Rt upper chest drain intact;  output minimal amt bloody fluid; 4mg  tpa in 20 cc sterile NS infused into rt upper chest drain and tube clamped for next hr; no air leak  Lab Results:   Recent Labs  11/12/13 0330 11/13/13 0400  WBC 7.2 12.2*  HGB 8.9* 9.3*  HCT 27.1* 28.7*  PLT 171 224   BMET  Recent Labs  11/12/13 0330 11/13/13 0400  NA 135* 134*  K 3.7 4.1  CL 99 98  CO2 26 23  GLUCOSE 158* 163*  BUN 21 23  CREATININE 0.86 0.87  CALCIUM 8.2* 8.5   PT/INR  Recent Labs  11/12/13 0330 11/13/13 0400  LABPROT 14.6 15.1  INR 1.16 1.22   ABG  Recent Labs  11/12/13 1241 11/13/13 0445  PHART 7.454* 7.432  HCO3 27.5* 24.1*    Studies/Results: Dg Chest Port 1 View  11/13/2013   CLINICAL DATA:  Left ventricular assist device.  EXAM: PORTABLE CHEST - 1 VIEW  COMPARISON:  11/12/2013  FINDINGS: Again noted is a pigtail drain within the right chest. There are persistent pleural and parenchymal densities throughout the right chest. Minimal change from the previous examination. No evidence for a large pneumothorax. Enlarged interstitial lung densities suggest vascular  congestion or mild edema. Stable appearance of the cardiac ICD. Central venous catheter tip at the superior cavoatrial junction. Heart size is upper limits of normal and median sternotomy wires are present. The endotracheal tube and feeding tube have been removed. Again noted is a left ventricular assist device.  IMPRESSION: Stable pleural and parenchymal densities in the right chest. A small bore chest tube is present and there is no evidence for a large pneumothorax.  Vascular congestion or interstitial pulmonary edema.  Support apparatuses as described.   Electronically Signed   By: Markus Daft M.D.   On: 11/13/2013 07:29   Dg Chest Port 1 View  11/12/2013   CLINICAL DATA:  Ventricular system lies  EXAM: PORTABLE CHEST - 1 VIEW  COMPARISON:  11/11/2013  FINDINGS: Cardiac shadow is stable. The left ventricular assist device is again noted. A defibrillator is again seen and stable. The endotracheal tube and feeding catheter are stable in appearance. A right jugular central venous line is again noted at the cavoatrial junction. The right upper lobe pleural drain is stable in appearance. Persistent density is noted in the right apex. A second right thoracostomy catheter is stable as well. The left lung remains clear.  IMPRESSION: No significant interval change from the prior exam.   Electronically Signed   By: Inez Catalina M.D.   On: 11/12/2013 07:27   Dg Abd Portable 1v  11/13/2013   CLINICAL DATA:  Evaluate ileus.  EXAM: PORTABLE ABDOMEN - 1 VIEW  COMPARISON:  DG ABD PORTABLE 1V dated 11/12/2013; DG ABD PORTABLE 1V dated 11/11/2013; DG CHEST 1V PORT dated 11/13/2013  FINDINGS: Single supine view of the abdomen was obtained. Evidence for a rectal tube or probe. Some of the contrast has advanced from the right colon to the pelvis and transverse colon area. There are gas-filled loops of small and large bowel. There is persistent dilatation of the right colon and cecal region.  IMPRESSION: Gas-filled loops of small  and large bowel. Findings could be associated with an adynamic ileus. There has been mild progression of the oral contrast.   Electronically Signed   By: Markus Daft M.D.   On: 11/13/2013 07:36   Dg Abd Portable 1v  11/12/2013   CLINICAL DATA:  Ileus  EXAM: PORTABLE ABDOMEN - 1 VIEW  COMPARISON:  11/11/2013  FINDINGS: Contrast material is noted within the right colon. A right central venous line is noted in the femoral vein. No bony abnormality is seen. No obstructive changes are noted.  IMPRESSION: The overall appearance is similar to that seen on the prior exam. No obstructive changes are noted.   Electronically Signed   By: Inez Catalina M.D.   On: 11/12/2013 07:32    Anti-infectives: Anti-infectives   Start     Dose/Rate Route Frequency Ordered Stop   11/07/13 1800  vancomycin (VANCOCIN) IVPB 750 mg/150 ml premix  Status:  Discontinued     750 mg 150 mL/hr over 60 Minutes Intravenous Every 12 hours 11/07/13 0727 11/12/13 1133   11/06/13 1130  vancomycin (VANCOCIN) 1,250 mg in sodium chloride 0.9 % 250 mL IVPB  Status:  Discontinued     1,250 mg 166.7 mL/hr over 90 Minutes Intravenous To Surgery 11/06/13 1119 11/06/13 1433   11/06/13 1130  cefUROXime (ZINACEF) 1.5 g in dextrose 5 % 50 mL IVPB  Status:  Discontinued     1.5 g 100 mL/hr over 30 Minutes Intravenous To Surgery 11/06/13 1119 11/06/13 1433   11/06/13 1130  cefUROXime (ZINACEF) 750 mg in dextrose 5 % 50 mL IVPB  Status:  Discontinued     750 mg 100 mL/hr over 30 Minutes Intravenous To Surgery 11/06/13 1117 11/06/13 1433   11/06/13 0900  cefUROXime (ZINACEF) 1.5 g in dextrose 5 % 50 mL IVPB     1.5 g 100 mL/hr over 30 Minutes Intravenous 60 min pre-op 11/06/13 0831 11/06/13 1224   11/05/13 2300  vancomycin (VANCOCIN) IVPB 1000 mg/200 mL premix  Status:  Discontinued     1,000 mg 200 mL/hr over 60 Minutes Intravenous Every 24 hours 11/05/13 2221 11/07/13 0727   11/05/13 2300  piperacillin-tazobactam (ZOSYN) IVPB 3.375 g  Status:   Discontinued     3.375 g 12.5 mL/hr over 240 Minutes Intravenous 3 times per day 11/05/13 2221 11/12/13 1133   11/02/13 0130  cefTRIAXone (ROCEPHIN) 1 g in dextrose 5 % 50 mL IVPB  Status:  Discontinued     1 g 100 mL/hr over 30 Minutes Intravenous Every 24 hours 11/02/13 0121 11/05/13 2209   11/01/13 2200  cephALEXin (KEFLEX) capsule 500 mg  Status:  Discontinued     500 mg Oral 2 times daily 11/01/13 2157 11/02/13 0121      Assessment/Plan: s/p rt upper chest drain 3/13 for loculated hemothorax; additional diluted TPA (4mg  in 20 cc sterile NS) placed into chest drain this am and tube clamped for 1 hr; monitor output and CXR  LOS: 12  days    Jamayah Myszka,D [facility] 11/13/2013

## 2013-11-13 NOTE — Progress Notes (Signed)
PARENTERAL NUTRITION CONSULT NOTE: Follow Up Consult  Pharmacy Consult:  TPN Indication: Ileus  Allergies  Allergen Reactions  . Diovan [Valsartan] Other (See Comments)    Hypotension at low dose, hyperkalemia  . Lipitor [Atorvastatin Calcium] Other (See Comments)    Muscle pain    Patient Measurements: Height: _0  (175.3 cm) Weight: 154 lb 1.6 oz (69.9 kg) IBW/kg (Calculated) : 70.7 Usual Weight: 68 kg  Vital Signs: Temp: 97.4 F (36.3 C) (03/16 0835) Temp src: Oral (03/16 0835) BP: 100/0 mmHg (03/16 0800) Pulse Rate: 92 (03/16 0800) Intake/Output from previous day: 03/15 0701 - 03/16 0700 In: 2697.2 [I.V.:312.7; NG/GT:90; IV Piggyback:62.5; TPN:2232] Out: 6440 [Urine:2130; Drains:186; [place]; Chest Tube:130]  Labs:  Recent Labs  11/11/13 0408  11/12/13 0330 11/12/13 1750 11/13/13 0400  WBC 6.3  --  7.2  --  12.2*  HGB 9.1*  --  8.9*  --  9.3*  HCT 28.1*  --  27.1*  --  28.7*  PLT 143*  --  171  --  224  APTT 51*  < > 53* 43* 56*  INR 1.19  --  1.16  --  1.22  < > = values in this interval not displayed.   Recent Labs  11/11/13 0408 11/12/13 0330 11/13/13 0400  NA 137 135* 134*  K 3.6* 3.7 4.1  CL 100 99 98  CO2 _1 GLUCOSE 144* 158* 163*  BUN _2 CREATININE 0.88 0.86 0.87  CALCIUM 8.1* 8.2* 8.5  MG 2.1  --  2.1  PHOS 2.5  --  3.0  PROT 6.0 6.1 6.7  ALBUMIN 2.3* 2.3* 2.6*  AST _3 ALT 64* 53 50  ALKPHOS 129* 147* 152*  BILITOT 1.2 1.1 1.1   Estimated Creatinine Clearance: 78.1 ml/min (by C-G formula based on Cr of 0.87).    Recent Labs  11/12/13 1930 11/12/13 2343 11/13/13 0833  GLUCAP 143* 153* 152*   Insulin Requirements in the past 24 hours:  10 unit sensitive SSI/24h with CBGs 112-170  Current Nutrition:  Clinimix E 5/15 at goal 70m/hr + lipid emulsion 20% at 112mhr = 1894kcal and 99.6g protein  Nutritional Goals:  1750-1850 kcal and 105-115g protein per RD note 3/10.  Assessment: 7025OM with history  of recent LVAD and discharged on 09/19/13.  Patient returned with N/V and CT scan revealed SBO.   GI:  SBO believed to have resolved. Had flatus and several BMs. TPN d/c'd 3/7. Resumed TPN 3/10 for inability to take po's/TFs and high NG output. KUB=Ileus. Improving now, but abdomen still distended- had 2 BMs overnight. Prealbumin 8.7. IV Reglan QID, IV PPI. Panda placed but MD notes inadequate feeding tube position.  Endo: hypothyroid on Synthroid (dose increased).  No hx DM - CBGs controlled on SSI  Lytes: Na 134, K 4.1, Phos 3, Mag 2.1, CorCa 9.6  Renal: Scr 0.87, UOP 1.43m25mg/hr  Cards:  s/p LVAD 09/19/13 -Cardiac arrest 3/8 with PEA with R hemothorax. Cardiogenic/hemorrhagic shock. 3/9: right VATS and evacuation of hematoma-hemothorax. Now off Levophed drip. Starting milrinone.  Hepatobil: Shock liver s/p PEA arrest 3/8. Now normalized with exception of alk phos still slightly elevated at 152. TBili normal at 1.1. Albumin low at 2.6.  Heme: Hgb 9.3, Plts 224  Resp: VDRF. Has R-sided chest tube and large R pleural effusion concerning for hemorrhage (on 3/9 CT). Chest tube with 130 out in 24 hours- improved after TPA yesterday, likely to be repeated by IR  today. FiO2 80% on partial rebreather  ID: antibiotics d/c'd. WBC 7.2. Afebrile.  Anticoag: LVAD. Coumadin PTA with INR 3.5 upon admit. Reversed. Heparin drip for LVAD at 1261m/hr per MD (goal ptt 50-60). INR 1.22  Best Practices: PPI IV, MC  TPN Access: CVC placed 11/02/13  TPN day#: 3/5-3/8, resumed 3/10  Plan: . 1. Continue Clinimix E 5/15 at 889mhr + lipid 20% emulsion at 10104mr 2. Daily IV multivitamin, trace elements MWF d/t national shortage 3. Continue sensitive SSI and CBG checks 4. TPN labs as ordered 5. [facility] [place], PharmD, BCPS Clinical Pharmacist Pager: [phone] 9:13 AM

## 2013-11-13 NOTE — Progress Notes (Signed)
NUTRITION FOLLOW UP  INTERVENTION: TPN per pharmacy RD to follow for nutrition care plan  NUTRITION DIAGNOSIS: Inadequate oral intake related to inability to eat as evidenced by NPO status, ongoing  Goal: Pt to meet >/= 90% of their estimated nutrition needs, met  Monitor:  TPN prescription, respiratory status, weight, labs, I/O's  ASSESSMENT: Patient is s/p LVAD on 09/19/2013; sent to the ED b/c of episodes of nausea vomiting and abdominal pain.   CT of abdomen/pelvis: dilated fluid and gas-filled stomach. Contrast extends into nondilated duodenum. Beyond this region, fluid and gas-filled dilated loops of small bowel consistent with SBO.   Patient s/p procedure 3/9: EMERGENT RIGHT CHEST TUBE PLACEMENT  Patient discussed in ICU rounds.    Pt extubated 3/15.  Noted pt became agitated with Panda tube placement.  Tube tip position was not reached post-pyloric and thereafter was removed given ongoing ileus.  Patient receiving Clinimix E 5/15 @ 83 ml/hr and lipids @ 10 ml/hr. Provides 1894 kcal and 100 grams protein per day. Meets 100% minimum estimated energy needs and 95% minimum estimated protein needs.  Height: Ht Readings from Last 1 Encounters:  11/06/13 _0  (1.753 m)    Weight: -----> stable  Wt Readings from Last 1 Encounters:  11/13/13 154 lb 1.6 oz (69.9 kg)    3/15  157 lb 3/14  157 lb 3/13  158 lb 3/12  154 lb 3/11  160 lb 3/10  171 lb 3/09  151 lb 3/08  151 lb 3/07  156 lb 3/06  156 lb    BMI:  Body mass index is 22.75 kg/(m^2).  Re-estimated needs: Kcal: 1850-2050 Protein: 105-115 gm Fluid: per MD  Skin: Intact  Diet Order: NPO   Intake/Output Summary (Last 24 hours) at 11/13/13 1106 Last data filed at 11/13/13 1000  Gross per 24 hour  Intake   2465 ml  Output   3183 ml  Net   -718 ml    Labs:   Recent Labs Lab 11/10/13 0425 11/11/13 0408 11/12/13 0330 11/13/13 0400  NA 137 137 135* 134*  K 3.9 3.6* 3.7 4.1  CL 99 100 99 98   CO2 _1 BUN 24* _2 CREATININE 1.04 0.88 0.86 0.87  CALCIUM 8.0* 8.1* 8.2* 8.5  MG 2.1 2.1  --  2.1  PHOS 2.5 2.5  --  3.0  GLUCOSE 143* 144* 158* 163*    Scheduled Meds: . antiseptic oral rinse  15 mL Mouth Rinse Q4H  . bisacodyl  10 mg Rectal Once  . budesonide (PULMICORT) nebulizer solution  0.25 mg Nebulization BID  . chlorhexidine  15 mL Mouth Rinse BID  . insulin aspart  0-9 Units Subcutaneous 6 times per day  . levothyroxine  100 mcg Intravenous Daily  . metoCLOPramide (REGLAN) injection  10 mg Intravenous 4 times per day  . pantoprazole (PROTONIX) IV  40 mg Intravenous Daily  . warfarin  5 mg Oral ONCE-1800  . Warfarin - Pharmacist Dosing Inpatient   Does not apply q1800    Continuous Infusions: . sodium chloride 20 mL/hr at 11/11/13 0925  . Marland KitchenTPN (CLINIMIX-E) Adult     And  . fat emulsion    . Marland KitchenTPN (CLINIMIX-E) Adult 83 mL/hr at 11/12/13 1752   And  . fat emulsion 250 mL (11/12/13 1752)  . feeding supplement (VITAL AF 1.2 CAL)    . heparin 1,200 Units/hr (11/13/13 0800)  . norepinephrine (LEVOPHED) Adult infusion Stopped (11/12/13 1125)  Past Medical History  Diagnosis Date  . Ischemic cardiomyopathy     a. 10/09 Echo: Sev LV Dysfxn, inf/lat AK, Mod MR.  [name];  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 Hypertension   . PVC (premature ventricular contraction)   . Pacemaker   . CHF (congestive heart failure)   . Shortness of breath   . Automatic implantable cardioverter-defibrillator in situ     Past Surgical History  Procedure Laterality Date  . Defibrillator  2009  . Total knee arthroplasty   11/27/11    left  . Insert / replace / remove pacemaker  2009  . Coronary angioplasty with stent placement  2004  . Coronary angioplasty with stent placement  07/2008  . Knee arthroscopy w/ partial medial meniscectomy  12/2002    left  . Knee arthroscopy      bilaterally  . Total knee arthroplasty  11/27/2011    Procedure: TOTAL KNEE ARTHROPLASTY;  Surgeon: Yvette Rack., MD;  Location: [place];  Service: Orthopedics;  Laterality: Left;  left total knee arthroplasty  . Esophagogastroduodenoscopy N/A 09/15/2013    Procedure: ESOPHAGOGASTRODUODENOSCOPY (EGD);  Surgeon: Ladene Artist, MD;  Location: [facility];  Service: Endoscopy;  Laterality: N/A;  bedside  . Insertion of implantable left ventricular assist device N/A 09/18/2013    Procedure: INSERTION OF IMPLANTABLE LEFT VENTRICULAR ASSIST DEVICE;  Surgeon: Ivin Poot, MD;  Location: [place];  Service: Open Heart Surgery;  Laterality: N/A;  CIRC ARREST  NITRIC OXIDE  . Intraoperative transesophageal echocardiogram N/A 09/18/2013    Procedure: INTRAOPERATIVE TRANSESOPHAGEAL ECHOCARDIOGRAM;  Surgeon: Ivin Poot, MD;  Location: [place];  Service: Open Heart Surgery;  Laterality: N/A;  . Video assisted thoracoscopy Right 11/06/2013    Procedure: VIDEO ASSISTED THORACOSCOPY;  Surgeon: Gaye Pollack, MD;  Location: [place];  Service: Cardiothoracic;  Laterality: Right;  . Hematoma evacuation Right 11/06/2013    Procedure: EVACUATION HEMATOMA;  Surgeon: Gaye Pollack, MD;  Location: [place];  Service: Cardiothoracic;  Laterality: Right;    Arthur Holms, RD, LDN Pager #: [phone] After-Hours Pager #: [phone]

## 2013-11-13 NOTE — Progress Notes (Signed)
ANTICOAGULATION CONSULT NOTE - Initial Consult  Pharmacy Consult for Coumadin Indication: LVAD  Allergies  Allergen Reactions  . Diovan [Valsartan] Other (See Comments)    Hypotension at low dose, hyperkalemia  . Lipitor [Atorvastatin Calcium] Other (See Comments)    Muscle pain    Patient Measurements: Height: 5\' 9"  (175.3 cm) Weight: 154 lb 1.6 oz (69.9 kg) IBW/kg (Calculated) : 70.7 Heparin Dosing Weight: n/a  Vital Signs: Temp: 97.4 F (36.3 C) (03/16 0835) Temp src: Oral (03/16 0835) BP: 100/0 mmHg (03/16 1000) Pulse Rate: 99 (03/16 1000)  Labs:  Recent Labs  11/11/13 0408  11/12/13 0330 11/12/13 1750 11/13/13 0400  HGB 9.1*  --  8.9*  --  9.3*  HCT 28.1*  --  27.1*  --  28.7*  PLT 143*  --  171  --  224  APTT 51*  < > 53* 43* 56*  LABPROT 14.8  --  14.6  --  15.1  INR 1.19  --  1.16  --  1.22  CREATININE 0.88  --  0.86  --  0.87  < > = values in this interval not displayed.  Estimated Creatinine Clearance: 78.1 ml/min (by C-G formula based on Cr of 0.87).   Medical History: Past Medical History  Diagnosis Date  . Ischemic cardiomyopathy     a. 10/09 Echo: Sev LV Dysfxn, inf/lat AK, Mod MR.  [name];  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 Hypertension   . PVC (premature ventricular contraction)   . Pacemaker   . CHF (congestive heart failure)   . Shortness of breath   . Automatic implantable cardioverter-defibrillator in situ     Medications:  Scheduled:  . antiseptic oral rinse  15 mL Mouth Rinse Q4H  . bisacodyl  10 mg Rectal Once  . budesonide (PULMICORT) nebulizer solution  0.25 mg Nebulization BID   . chlorhexidine  15 mL Mouth Rinse BID  . insulin aspart  0-9 Units Subcutaneous 6 times per day  . levothyroxine  100 mcg Intravenous Daily  . metoCLOPramide (REGLAN) injection  10 mg Intravenous 4 times per day  . pantoprazole (PROTONIX) IV  40 mg Intravenous Daily    Assessment: 73 yo male with hx CAD, and HF s/p LVAD HMII 09/19/13.  Admitted 3/4 with SBO/ileus, and Coumadin was held in setting of R hemothorax.  Now s/p chest tube and multiple units transfusion.  Also had R chest drain placed on 3/13.  Has remained on heparin IV infusion dosed by TCTS with goal PTT 50-60.  Discussed with Dr. Prescott Gum and Dr. Haroldine Laws.  Will resume Coumadin tonight.  Continue IV heparin as well.  Today's INR near normal at 1.22  Goal of Therapy:  INR 2-3 Monitor platelets by anticoagulation protocol: Yes   Plan:  1. Coumadin 5 mg po x 1 tonight. 2. Continue daily PT/INR. 3. Will monitor for s/s bleeding, or any return of hemothorax.  Uvaldo Rising, BCPS  Clinical Pharmacist Pager [phone]  11/13/2013 11:04 AM

## 2013-11-13 NOTE — Progress Notes (Signed)
PULMONARY  / CRITICAL CARE MEDICINE  Name: JULIE HENNION MRN: [id number] DOB: 07-18-1941    ADMISSION DATE:  11/01/2013 CONSULTATION DATE:  3/8  PRIMARY SERVICE: CHF service  CHIEF COMPLAINT:  Cardiogenic Shock  BRIEF PATIENT DESCRIPTION: 66 male with LVAD implanted 09/19/2013 with onset of shock, likely due to hemorrhage, necessitating intubation  SIGNIFICANT EVENTS / STUDIES:  3/8 CXR with with complete opacification of right hemothorax 3/9 improvement of right hemothorax s/p chest tube.  ET tube in place 3/9 CT Chest abd pelvis: Ventricular assistance device and left-sided pacemaker are noted. Right-sided chest tube has been placed with distal tip directed toward posterior portion of chest. Large right pleural effusion is noted with high density material layering within it concerning for hemorrhage 3/13 Pigtail cath placement by IR to R apical fluid collection > bloody fluid removed  LINES / TUBES: R [place] CVL 3/5 >>  Right Femoral CVL 3/5 >> 3/10 ETT 3/08 >> 3/15 R femoral A-line 3/08 >> 3/15  CULTURES: BAL 3/10>>>NEG Sputum 3/9>> NOF Blood 3/8>>>NEG  ANTIBIOTICS: Zosyn 3/5>>>3/15 [place] 3/5>> 3/15  SUBJECTIVE:  No new complaints. On PRB mask due to low PO2 on ABG this AM  VITAL SIGNS: Temp:  [97.4 F (36.3 C)-98 F (36.7 C)] 98 F (36.7 C) (03/16 1139) Pulse Rate:  [84-110] 91 (03/16 1400) Resp:  [15-34] 17 (03/16 1400) BP: (94-100)/(0) 94/0 mmHg (03/16 1400) SpO2:  [74 %-100 %] 99 % (03/16 1400) Arterial Line BP: (75-122)/(65-90) 116/89 mmHg (03/16 0400) FiO2 (%):  [50 %-80 %] 50 % (03/16 1200) Weight:  [69.9 kg (154 lb 1.6 oz)] 69.9 kg (154 lb 1.6 oz) (03/16 0500)  HEMODYNAMICS: CVP:  [7 mmHg-18 mmHg] 7 mmHg  VENTILATOR SETTINGS: Vent Mode:  [-]  FiO2 (%):  [50 %-80 %] 50 %  INTAKE / OUTPUT: Intake/Output     03/15 0701 - 03/16 0700 03/16 0701 - 03/17 0700   I.V. (mL/kg) 312.7 (4.5) 84 (1.2)   NG/GT 90    IV Piggyback 62.5    TPN 2232 651   Total  Intake(mL/kg) 2697.2 (38.6) 735 (10.5)   Urine (mL/kg/hr) 2130 (1.3) 2750 (5.4)   Drains 186 (0.1) 60 (0.1)   Stool 2 (0)    Chest Tube 130 (0.1) 40 (0.1)   Total Output 2448 2850   Net +249.2 -2115        Stool Occurrence  1 x    PHYSICAL EXAMINATION: General: NAD. Neuro: no focal deficits HEENT: WNL Cardiovascular: RRR, Nl S1/S2, -M/R/G. Lungs: Coarse BS diffusely.  Abdomen: Soft, NT, ND and +BS. Musculoskeletal:  No lower extremity edema  LABS: I have reviewed all of today's lab results. Relevant abnormalities are discussed in the A/P section  CXR: [place]    ASSESSMENT / PLAN:  PULMONARY A:  Respiratory Failure Hemothorax P:   Cont supplemental O2 to maintain SpO2 > 92%  CARDIOVASCULAR A: Cardiogenic/Hemorrhagic Shock, resolved LVAD in place P:  Mgmt per Cards/TCTS  RENAL A:  AKI, resolved.  P: Monitor BMET intermittently Monitor I/Os Correct electrolytes as indicated   GASTROINTESTINAL A:  No issues P:   SUP: PPI NPO post extubation until risk of re-intubation improves  HEMATOLOGIC A: Acute blood loss anemia - no further bleeding noted P:  Mgmt per Cards and TCTS  INFECTIOUS A:  Doubt sepsis P:   Micro and abx as above  ENDOCRINE A: Hyperglycemia P:   Cont SSI  NEUROLOGIC A: ICU associated pain/[place] P:   Cont current Rx  PCCM will  sign off. Please call if we can be of further assistance    Merton Border, MD ; [facility] [phone].  After 5:30 PM or weekends, call [phone]

## 2013-11-13 NOTE — Evaluation (Signed)
Physical Therapy Evaluation Patient Details Name: Johnny Oneill MRN: [id number] DOB: 11-Oct-1940 Today's Date: 11/13/2013 Time: [id number] PT Time Calculation (min): 24 min  PT Assessment / Plan / Recommendation History of Present Illness  Johnny Oneill is a 73 year old male with history of CAD s/p stent to LCX 2004 with re-occlusion of LCX with lateral MI 2009 and repeat stenting, total occlusion of RCA, 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 S/P L TKR 2013. Last Cath 2009..  [name] in Jan. 2014.  Now returns with vomiting weakness.  pt s/p VATS for hematoma evac and with postop respiratory complications and vent.  Extubation 3/15.  Clinical Impression  Pt admitted with above complications.  Pt currently limited functionally due to the problems listed below.  (see problems list.)  Pt will benefit from PT to maximize function and safety to be able to get home safely with available assist of wife (and family).     PT Assessment  Patient needs continued PT services    Follow Up Recommendations  Home health PT    Does the patient have the potential to tolerate intense rehabilitation      Barriers to Discharge        Equipment Recommendations  None recommended by PT    Recommendations for Other Services     Frequency Min 3X/week    Precautions / Restrictions Precautions Precautions: Fall (LVAD precautions) Precaution Comments: recent LVAD placement Restrictions Weight Bearing Restrictions: No   Pertinent Vitals/Pain VSS, PI's very steady      Mobility  Bed Mobility Overal bed mobility: Needs Assistance Bed Mobility: Supine to Sit Supine to sit: Mod assist;+2 for physical assistance;HOB elevated General bed mobility comments: cues for sequencing; truncal assist Transfers Overall transfer level: Needs assistance Transfers: Sit to/from Stand;Stand Pivot Transfers Sit to  Stand: Mod assist;+2 physical assistance Stand pivot transfers: Mod assist;+2 physical assistance General transfer comment: cues for sequencing.  significant stability assist and help advancing his feet to sidestep.    Exercises     PT Diagnosis: Difficulty walking;Generalized weakness  PT Problem List: Decreased strength;Decreased activity tolerance;Decreased balance;Decreased mobility PT Treatment Interventions: DME instruction;Gait training;Functional mobility training;Therapeutic activities;Balance training;Patient/family education     PT Goals(Current goals can be found in the care plan section) Acute Rehab PT Goals PT Goal Formulation: With patient Time For Goal Achievement: 11/13/13 Potential to Achieve Goals: Good  Visit Information  Last PT Received On: 11/13/13 Assistance Needed: +2 History of Present Illness: Johnny Oneill is a 73 year old male with history of CAD s/p stent to LCX 2004 with re-occlusion of LCX with lateral MI 2009 and repeat stenting, total occlusion of RCA, 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 S/P L TKR 2013. Last Cath 2009..  [name] in Jan. 2014.  Now returns with vomiting weakness.  pt s/p VATS for hematoma evac and with postop respiratory complications and vent.  Extubation 3/15.       Prior Lucky expects to be discharged to:: Private residence Living Arrangements: Spouse/significant other Available Help at Discharge: Family Type of Home: House Home Access: Stairs to enter [employer] of Steps: 2 Entrance Stairs-Rails: None Home Layout: Two level;Able to live on main level with bedroom/bathroom Alternate Level Stairs-Number of Steps: flight Alternate Level Stairs-Rails: Left;Right Home Equipment: Walker - 2 wheels;Tub  bench;Shower seat Prior Function Level of Independence: Independent with  assistive device(s) Comments: driving, out and about Communication Communication: No difficulties    Cognition  Cognition Arousal/Alertness: Lethargic Behavior During Therapy: Flat affect Overall Cognitive Status: Within Functional Limits for tasks assessed    Extremity/Trunk Assessment     Balance Balance Overall balance assessment: Needs assistance Sitting-balance support: Single extremity supported;Feet supported Sitting balance-Leahy Scale: Poor Sitting balance - Comments: tendency to list posteriorly Standing balance support: Bilateral upper extremity supported Standing balance-Leahy Scale: Poor Standing balance comment: backwrd list  End of Session PT - End of Session Equipment Utilized During Treatment: Oxygen Activity Tolerance: Patient tolerated treatment well;Patient limited by fatigue Patient left: in chair;with call bell/phone within reach Nurse Communication: Mobility status  GP     Perpetua Elling, Tessie Fass 11/13/2013, 12:44 PM 11/13/2013  Donnella Sham, [place] [phone]  (pager)

## 2013-11-14 ENCOUNTER — Inpatient Hospital Stay (HOSPITAL_COMMUNITY): Payer: Medicare Other

## 2013-11-14 DIAGNOSIS — Z95818 Presence of other cardiac implants and grafts: Secondary | ICD-10-CM

## 2013-11-14 DIAGNOSIS — I5023 Acute on chronic systolic (congestive) heart failure: Secondary | ICD-10-CM

## 2013-11-14 LAB — BODY FLUID CULTURE

## 2013-11-14 LAB — APTT
aPTT: 50 seconds — ABNORMAL HIGH (ref 24–37)
aPTT: 54 seconds — ABNORMAL HIGH (ref 24–37)

## 2013-11-14 LAB — GLUCOSE, CAPILLARY
Glucose-Capillary: 131 mg/dL — ABNORMAL HIGH (ref 70–99)
Glucose-Capillary: 144 mg/dL — ABNORMAL HIGH (ref 70–99)
Glucose-Capillary: 151 mg/dL — ABNORMAL HIGH (ref 70–99)
Glucose-Capillary: 155 mg/dL — ABNORMAL HIGH (ref 70–99)
Glucose-Capillary: 121 mg/dL — ABNORMAL HIGH (ref 70–99)
Glucose-Capillary: 118 mg/dL — ABNORMAL HIGH (ref 70–99)
Glucose-Capillary: 121 mg/dL — ABNORMAL HIGH (ref 70–99)
Glucose-Capillary: 135 mg/dL — ABNORMAL HIGH (ref 70–99)
Glucose-Capillary: 134 mg/dL — ABNORMAL HIGH (ref 70–99)

## 2013-11-14 LAB — URINE CULTURE: Special Requests: NORMAL

## 2013-11-14 LAB — TRIGLYCERIDES: Triglycerides: 93 mg/dL (ref <150)

## 2013-11-14 LAB — COMPREHENSIVE METABOLIC PANEL
ALT: 35 U/L (ref 0–53)
AST: 26 U/L (ref 0–37)
Albumin: 2.2 g/dL — ABNORMAL LOW (ref 3.5–5.2)
Alkaline Phosphatase: 124 U/L — ABNORMAL HIGH (ref 39–117)
BUN: 24 mg/dL — ABNORMAL HIGH (ref 6–23)
CO2: 28 mEq/L (ref 19–32)
Calcium: 8.2 mg/dL — ABNORMAL LOW (ref 8.4–10.5)
Chloride: 100 mEq/L (ref 96–112)
Creatinine, Ser: 0.9 mg/dL (ref 0.50–1.35)
GFR calc Af Amer: >90 mL/min (ref >90)
GFR calc non Af Amer: 84 mL/min — ABNORMAL LOW (ref >90)
Glucose, Bld: 125 mg/dL — ABNORMAL HIGH (ref 70–99)
Potassium: 3.9 mEq/L (ref 3.7–5.3)
Sodium: 138 mEq/L (ref 137–147)
Total Bilirubin: 1.2 mg/dL (ref 0.3–1.2)
Total Protein: 6 g/dL (ref 6.0–8.3)

## 2013-11-14 LAB — BLOOD GAS, ARTERIAL
Acid-Base Excess: 5.1 mmol/L — ABNORMAL HIGH (ref 0.0–2.0)
Bicarbonate: 28.9 mEq/L — ABNORMAL HIGH (ref 20.0–24.0)
Drawn by: 36277
O2 Content: 4 L/min
O2 Saturation: 96.4 %
Patient temperature: 98.6
TCO2: 30.1 mmol/L (ref 0–100)
pCO2 arterial: 40.6 mmHg (ref 35.0–45.0)
pH, Arterial: 7.466 — ABNORMAL HIGH (ref 7.350–7.450)
pO2, Arterial: 79.4 mmHg — ABNORMAL LOW (ref 80.0–100.0)

## 2013-11-14 LAB — CBC
HCT: 27.8 % — ABNORMAL LOW (ref 39.0–52.0)
Hemoglobin: 8.9 g/dL — ABNORMAL LOW (ref 13.0–17.0)
MCH: 28.9 pg (ref 26.0–34.0)
MCHC: 32 g/dL (ref 30.0–36.0)
MCV: 90.3 fL (ref 78.0–100.0)
Platelets: 250 10*3/uL (ref 150–400)
RBC: 3.08 MIL/uL — ABNORMAL LOW (ref 4.22–5.81)
RDW: 16.8 % — ABNORMAL HIGH (ref 11.5–15.5)
WBC: 9.2 10*3/uL (ref 4.0–10.5)

## 2013-11-14 LAB — CARBOXYHEMOGLOBIN
Carboxyhemoglobin: 1.8 % — ABNORMAL HIGH (ref 0.5–1.5)
Methemoglobin: 1.6 % — ABNORMAL HIGH (ref 0.0–1.5)
O2 Saturation: 58.5 %
Total hemoglobin: 9.2 g/dL — ABNORMAL LOW (ref 13.5–18.0)

## 2013-11-14 LAB — PROTIME-INR
INR: 1.23 (ref 0.00–1.49)
Prothrombin Time: 15.2 seconds (ref 11.6–15.2)

## 2013-11-14 LAB — PREALBUMIN: Prealbumin: 11.7 mg/dL — ABNORMAL LOW (ref 17.0–34.0)

## 2013-11-14 MED ORDER — LISINOPRIL 2.5 MG PO TABS
2.5000 mg | ORAL_TABLET | Freq: Every day | ORAL | Status: DC
  Administered 2013-11-14 – 2013-11-17 (×4): 2.5 mg via ORAL
  Filled 2013-11-14 (×4): qty 1

## 2013-11-14 MED ORDER — WARFARIN - PHYSICIAN DOSING INPATIENT
Freq: Every day | Status: DC
  Administered 2013-11-15: 18:00:00

## 2013-11-14 MED ORDER — WARFARIN SODIUM 2.5 MG PO TABS
2.5000 mg | ORAL_TABLET | Freq: Every day | ORAL | Status: DC
Start: 2013-11-14 — End: 2013-11-15
  Administered 2013-11-14: 2.5 mg via ORAL
  Filled 2013-11-14 (×2): qty 1

## 2013-11-14 MED ORDER — M.V.I. ADULT IV INJ
INTRAVENOUS | Status: AC
  Administered 2013-11-14: 18:00:00 via INTRAVENOUS
  Filled 2013-11-14: qty 2000

## 2013-11-14 MED ORDER — FAT EMULSION 20 % IV EMUL
240.0000 mL | INTRAVENOUS | Status: AC
  Administered 2013-11-14: 240 mL via INTRAVENOUS
  Filled 2013-11-14: qty 250

## 2013-11-14 MED ORDER — INSULIN ASPART 100 UNIT/ML ~~LOC~~ SOLN
0.0000 [IU] | Freq: Three times a day (TID) | SUBCUTANEOUS | Status: DC
  Administered 2013-11-14: 1 [IU] via SUBCUTANEOUS

## 2013-11-14 MED ORDER — FUROSEMIDE 20 MG PO TABS
20.0000 mg | ORAL_TABLET | Freq: Every day | ORAL | Status: DC
  Administered 2013-11-15: 20 mg via ORAL
  Filled 2013-11-14 (×2): qty 1

## 2013-11-14 NOTE — Progress Notes (Signed)
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 He is currently supported with a right IJ catheter. Femoral  A-line has been removed   Patient developed cardiac arrest from right hemo-thorax tension on right heart and was intubated with ventilator-dependent respiratory failure for 7 days. Now extubated, doing well The chest x-ray shows significant clearing after the procedures and drainage tubes were placed. His bronchial washings are negative and white count normal  The patient's LVAD function has been normal since evacuation of right hemothorax and he is maintained sinus rhythm. He is on heparin infusion while he is n.p.o.Will start PO today after swallow eval  The partial small bowel obstruction is resolved he currently has ileus. Attempt at placing feeding tube by interventional radiology was not successful for postpyloric positioning. The patient currently is having BM's with normal KUB  The patient'spulmonarystatus has progressively improved after right VATS and subsequent pigtail catheter drainage and he was weaned and extubated today. He is breathing comfortably with a strong cough.Will remove pigtail soon  We'll plan  On resuming PT, diet and transfer to stepdown  LVAD INTERROGATION HeartMate II LVAD:  Flow 4.2 liters/min, speed 9400 RPM, power 5.0, PI 5.5  0 PI events   Objective:    Vital Signs:   Temp:  [97.4 F (36.3 C)-98.2 F (36.8 C)] 98.2 F (36.8 C) (03/17 0500) Pulse Rate:   [80-99] 80 (03/17 0700) Resp:  [13-24] 13 (03/17 0700) BP: (92-100)/(0) 94/0 mmHg (03/16 1800) SpO2:  [74 %-100 %] 96 % (03/17 0717) FiO2 (%):  [50 %] 50 % (03/16 1200) Weight:  [153 lb 7 oz (69.6 kg)] 153 lb 7 oz (69.6 kg) (03/17 0500) Last BM Date: 11/13/13 Mean arterial Pressure  82 Intake/Output:   Intake/Output Summary (Last 24 hours) at 11/14/13 0812 Last data filed at 11/14/13 0700  Gross per 24 hour  Intake   2415 ml  Output   4330 ml  Net  -1915 ml     Physical Exam: General:  Well appearing. 9 extubated and responsive breathing comfortably  HEENT: normal Neck: supple. JVP . Carotids 2+ bilat; no bruits. No lymphadenopathy or thryomegaly appreciated. Cor: Mechanical heart sounds with LVAD hum present. Lungs: clear, Right chest tube present and right pigtail catheter present  Abdomen: soft, nontender, mildly distended. No hepatosplenomegaly. No bruits or masses. diminished  bowel sounds. Driveline: C/D/I; securement device intact and driveline incorporated Extremities: no cyanosis, clubbing, rash, mild edema, extremities warm  Neuro: Intact moves all extremities but weak  Telemetry: sinus rhythm  Labs: Basic Metabolic Panel:  Recent Labs Lab 11/08/13 0415  11/09/13 0400 11/10/13 0425 11/11/13 0408 11/12/13 0330 11/13/13 0400 11/14/13 0511  NA 137  < > 140 137 137 135* 134* 138  K 3.4*  < > 3.7 3.9 3.6* 3.7 4.1 3.9  CL 101  < > 102 99 100 99 98 100  CO2 24  < > 25 26 26 26 23 28   GLUCOSE 137*  < > 161* 143* 144* 158* 163* 125*  BUN 31*  < > 26* 24* 20 21 23  24*  CREATININE 1.26  < >  1.20 1.04 0.88 0.86 0.87 0.90  CALCIUM 8.1*  < > 8.1* 8.0* 8.1* 8.2* 8.5 8.2*  MG 2.0  --  2.0 2.1 2.1  --  2.1  --   PHOS 3.3  --  2.9 2.5 2.5  --  3.0  --   < > = values in this interval not displayed.  Liver Function Tests:  Recent Labs Lab 11/10/13 0425 11/11/13 0408 11/12/13 0330 11/13/13 0400 11/14/13 0511  AST 32 28 30 31 26   ALT 87* 64* 53 50 35  ALKPHOS 110  129* 147* 152* 124*  BILITOT 1.0 1.2 1.1 1.1 1.2  PROT 5.8* 6.0 6.1 6.7 6.0  ALBUMIN 2.3* 2.3* 2.3* 2.6* 2.2*    Recent Labs Lab 11/09/13 1730  AMYLASE 24   No results found for this basename: AMMONIA,  in the last 168 hours  CBC:  Recent Labs Lab 11/10/13 0425 11/11/13 0408 11/12/13 0330 11/13/13 0400 11/14/13 0511  WBC 7.8 6.3 7.2 12.2* 9.2  HGB 8.9* 9.1* 8.9* 9.3* 8.9*  HCT 27.4* 28.1* 27.1* 28.7* 27.8*  MCV 90.4 90.1 88.6 89.1 90.3  PLT 149* 143* 171 224 250    INR:  Recent Labs Lab 11/10/13 0425 11/11/13 0408 11/12/13 0330 11/13/13 0400 11/14/13 0511  INR 1.31 1.19 1.16 1.22 1.23    Other results:  EKG:   Imaging: Dg Chest Port 1 View  11/14/2013   CLINICAL DATA:  Follow-up hemothorax, chest tube, post LVAD placement  EXAM: PORTABLE CHEST - 1 VIEW  COMPARISON:  11/13/2013  FINDINGS: Patchy opacity in the right mid lung, corresponding to known hemothorax, improved. Indwelling pigtail drainage catheter. Small right pleural effusion with apical capping. No pneumothorax.  Cardiomegaly with pulmonary vascular congestion. No frank interstitial edema.  LVAD. Left subclavian ICD. Right IJ venous catheter terminating at the cavoatrial junction.  IMPRESSION: Improving right hemothorax with indwelling pigtail drainage catheter.  Small right pleural effusion with apical capping.  No pneumothorax.   Electronically Signed   By: Julian Hy M.D.   On: 11/14/2013 08:00   Dg Chest Port 1 View  11/13/2013   CLINICAL DATA:  Left ventricular assist device.  EXAM: PORTABLE CHEST - 1 VIEW  COMPARISON:  11/12/2013  FINDINGS: Again noted is a pigtail drain within the right chest. There are persistent pleural and parenchymal densities throughout the right chest. Minimal change from the previous examination. No evidence for a large pneumothorax. Enlarged interstitial lung densities suggest vascular congestion or mild edema. Stable appearance of the cardiac ICD. Central venous  catheter tip at the superior cavoatrial junction. Heart size is upper limits of normal and median sternotomy wires are present. The endotracheal tube and feeding tube have been removed. Again noted is a left ventricular assist device.  IMPRESSION: Stable pleural and parenchymal densities in the right chest. A small bore chest tube is present and there is no evidence for a large pneumothorax.  Vascular congestion or interstitial pulmonary edema.  Support apparatuses as described.   Electronically Signed   By: Markus Daft M.D.   On: 11/13/2013 07:29   Dg Abd Portable 1v  11/13/2013   CLINICAL DATA:  Evaluate ileus.  EXAM: PORTABLE ABDOMEN - 1 VIEW  COMPARISON:  DG ABD PORTABLE 1V dated 11/12/2013; DG ABD PORTABLE 1V dated 11/11/2013; DG CHEST 1V PORT dated 11/13/2013  FINDINGS: Single supine view of the abdomen was obtained. Evidence for a rectal tube or probe. Some of the contrast has advanced from the right colon to the pelvis and  transverse colon area. There are gas-filled loops of small and large bowel. There is persistent dilatation of the right colon and cecal region.  IMPRESSION: Gas-filled loops of small and large bowel. Findings could be associated with an adynamic ileus. There has been mild progression of the oral contrast.   Electronically Signed   By: Markus Daft M.D.   On: 11/13/2013 07:36     Medications:     Scheduled Medications: . antiseptic oral rinse  15 mL Mouth Rinse Q4H  . bisacodyl  10 mg Rectal Once  . budesonide (PULMICORT) nebulizer solution  0.25 mg Nebulization BID  . [START ON 11/15/2013] furosemide  20 mg Oral Daily  . insulin aspart  0-9 Units Subcutaneous TID WC  . levothyroxine  100 mcg Intravenous Daily  . lisinopril  2.5 mg Oral Daily  . metoCLOPramide (REGLAN) injection  10 mg Intravenous 4 times per day  . pantoprazole (PROTONIX) IV  40 mg Intravenous Daily  . warfarin  2.5 mg Oral q1800  . Warfarin - Pharmacist Dosing Inpatient   Does not apply q1800     Infusions: . sodium chloride 20 mL/hr at 11/11/13 0925  . Marland KitchenTPN (CLINIMIX-E) Adult 83 mL/hr at 11/13/13 1752   And  . fat emulsion 240 mL (11/13/13 1752)  . heparin 1,250 Units/hr (11/14/13 0719)    PRN Medications: diphenhydrAMINE, fentaNYL, LORazepam, ondansetron (ZOFRAN) IV, phenol, sodium chloride   Assessment:   Cont heparin until INR 2.0- starting po coumadin  Systemic anticoagulation with heparin, goal PTT 50-60  Continue systemic antibiotics complete 7-10 days for possible pneumonia  Transfer to stepdown  Partial SBO, then ileus now resolved. Cont TNA for another day Plan/Discussion:      I reviewed the LVAD parameters from today, and compared the results to the patient's prior recorded data.  No programming changes were made.  The LVAD is functioning within specified parameters.  The patient performs LVAD self-test daily.  LVAD interrogation was negative for any significant power changes, alarms or PI events/speed drops.  LVAD equipment check completed and is in good working order.  Back-up equipment present.   LVAD education done on emergency procedures and precautions and reviewed exit site care.  Length of Stay: [place] 11/14/2013, 8:12 AM  VAD Team Pager [phone] (7am - 7am)

## 2013-11-14 NOTE — Evaluation (Signed)
Clinical/Bedside Swallow Evaluation Patient Details  Name: Johnny Oneill MRN: [id number] Date of Birth: Nov 23, 1940  Today's Date: 11/14/2013 Time: 1050-1103 SLP Time Calculation (min): 13 min  Past Medical History:  Past Medical History  Diagnosis Date  . Ischemic cardiomyopathy     a. 10/09 Echo: Sev LV Dysfxn, inf/lat AK, Mod MR.  [name];  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 Hypertension   . PVC (premature ventricular contraction)   . Pacemaker   . CHF (congestive heart failure)   . Shortness of breath   . Automatic implantable cardioverter-defibrillator in situ    Past Surgical History:  Past Surgical History  Procedure Laterality Date  . Defibrillator  2009  . Total knee arthroplasty  11/27/11    left  . Insert / replace / remove pacemaker  2009  . Coronary angioplasty with stent placement  2004  . Coronary angioplasty with stent placement  07/2008  . Knee arthroscopy w/ partial medial meniscectomy  12/2002    left  . Knee arthroscopy      bilaterally  . Total knee arthroplasty  11/27/2011    Procedure: TOTAL KNEE ARTHROPLASTY;  Surgeon: Yvette Rack., MD;  Location: [place];  Service: Orthopedics;  Laterality: Left;  left total knee arthroplasty  . Esophagogastroduodenoscopy N/A 09/15/2013    Procedure: ESOPHAGOGASTRODUODENOSCOPY (EGD);  Surgeon: Ladene Artist, MD;  Location[facility];  Service: Endoscopy;  Laterality: N/A;  bedside  . Insertion of implantable left ventricular assist device N/A 09/18/2013    Procedure: INSERTION OF IMPLANTABLE LEFT VENTRICULAR ASSIST DEVICE;  Surgeon: Ivin Poot, MD;  Location: [place];   Service: Open Heart Surgery;  Laterality: N/A;  CIRC ARREST  NITRIC OXIDE  . Intraoperative transesophageal echocardiogram N/A 09/18/2013    Procedure: INTRAOPERATIVE TRANSESOPHAGEAL ECHOCARDIOGRAM;  Surgeon: Ivin Poot, MD;  Location: [place];  Service: Open Heart Surgery;  Laterality: N/A;  . Video assisted thoracoscopy Right 11/06/2013    Procedure: VIDEO ASSISTED THORACOSCOPY;  Surgeon: Gaye Pollack, MD;  Location: [facility] OR;  Service: Cardiothoracic;  Laterality: Right;  . Hematoma evacuation Right 11/06/2013    Procedure: EVACUATION HEMATOMA;  Surgeon: Gaye Pollack, MD;  Location: [facility] OR;  Service: Cardiothoracic;  Laterality: Right;   HPI:  73 year old patient with a history of CAD and HF s/p LVAD HMII 09/19/2013 currently [facility] Transplant list . Admitted to [facility] 09/13/13 through 10/03/13. Initially admitted with decompensated heart failure and low output. LVAD HM II placed on 09/19/13.   Admitted on 3/4 with SBO/ileus. Cardiac arrest 3/8 with PEA in setting of R hemothorax. Underwent R VATs 3/9 then bronch on 3/10. On 3/13 underwent placement of R chest drain by IR.  Extubated 3/15.   TPN for nutrition.     Assessment / Plan / Recommendation Clinical Impression  Pt presents with normal oropharyngeal function with active mastication, swift swallow trigger, and no s/s of compromised airway protection.  RR is compatible with adequate swallow timing; vocal quality and cough are strong.  Recommend  advancing diet to regular, thin liquids.  No SLP f/u warranted.      Aspiration Risk  Mild    Diet Recommendation Regular;Thin liquid   Liquid Administration via: Cup;Straw Medication Administration: Whole meds with liquid Supervision: Patient able to self feed    Other  Recommendations Oral Care Recommendations: Oral care BID   Follow Up Recommendations  None           SLP Swallow Goals     Swallow Study Prior Functional Status       General Date of Onset: 11/01/13 HPI: 73 year old patient  with a history of CAD and HF s/p LVAD HMII 09/19/2013 currently [facility] Transplant list . Admitted to [facility] 09/13/13 through 10/03/13. Initially admitted with decompensated heart failure and low output. LVAD HM II placed on 09/19/13.   Admitted on 3/4 with SBO/ileus. Cardiac arrest 3/8 with PEA in setting of R hemothorax. Underwent R VATs 3/9 then bronch on 3/10. On 3/13 underwent placement of R chest drain by IR.  Extubated 3/15.   TPN for nutrition.   Type of Study: Bedside swallow evaluation Previous Swallow Assessment: none per records Diet Prior to this Study: TNA Temperature Spikes Noted: No Respiratory Status: Nasal cannula History of Recent Intubation: Yes Length of Intubations (days): 7 days Date extubated: 11/12/13 Behavior/Cognition: Alert;Cooperative Oral Cavity - Dentition: Adequate natural dentition Self-Feeding Abilities: Able to feed self Patient Positioning: Upright in chair Baseline Vocal Quality: Clear Volitional Cough: Strong Volitional Swallow: Able to elicit    Oral/Motor/Sensory Function Overall Oral Motor/Sensory Function: Appears within functional limits for tasks assessed   Ice Chips Ice chips: Not tested   Thin Liquid Thin Liquid: Within functional limits Presentation: Cup;Straw    Nectar Thick Nectar Thick Liquid: Not tested   Honey Thick Honey Thick Liquid: Not tested   Puree Puree: Within functional limits Presentation: Self Fed;Spoon   Solid     Solid: Within functional limits Presentation: [place]. Tivis Ringer, [place] CCC/SLP Pager [phone]  Juan Quam Laurice 11/14/2013,11:04 AM

## 2013-11-14 NOTE — Progress Notes (Signed)
Advanced Heart Failure Team Rounding Note     Subjective:    Johnny Oneill is a 73 year old patient with a history of CAD and HF s/p LVAD HMII 09/19/2013 currently [facility] Transplant list .   Admitted to [facility] 09/13/13 through 10/03/13. Initially admitted with decompenstaed heart failure and low output. Required IABP for stabilization. He ultimately had LVAD HM II placed on 09/19/13. He suffered from RV failure after the procedure and required milrinone for >7 days which was gradually weaned off. D/C weight 151 pounds. f  Admitted on 3/4 with SBO/ileus. Was improving then had cardiac arrest last night (3/8) with PEA in setting of R hemothorax. Now s/p R chest tube and multiple units transfusion. Underwent R VATs 3/9 then bronch on 3/10. On 3/13 underwent placement of R chest drain by IR. Output has slowed down so TPA has been placed and now improved.   Stable overnight. Weight down 1 lb. 24 hr I/O -2 liters. Minimal CT output. Co-ox 59%. MAPs 90-98. Remains on heparin. INR 1.23. Loose BM last night. Denies SOB.   LVAD INTERROGATION:  HeartMate II LVAD: Flow 4.6 liters/min, speed 9400, power 5.5 PI 4.6; 2 PI events  I reviewed the LVAD parameters from today, and compared the results to the patient's prior recorded data. No programming changes were made. The LVAD is functioning within specified parameters. The patient performs LVAD self-test daily. LVAD interrogation was negative for any significant power changes, alarms or PI events/speed drops. LVAD equipment check completed and is in good working order. Back-up equipment present. LVAD education done on emergency procedures and precautions and reviewed exit site care.   CVPm 7 Physical Exam: GENERAL: Fatigued appearing; conversant HEENT: normal   NECK: Supple CARDIAC: Mechanical heart sounds with LVAD hum present.  LUNGS: R chest tube. Decreased on R ABDOMEN: soft nontender,  Bowel sounds good LVAD exit site: well-healed and incorporated. Dressing dry and  intact. No erythema or drainage. Stabilization device present and accurately applied. Driveline dressing is being changed daily per sterile technique.  EXTREMITIES: Warm and dry, no cyanosis, clubbing, rash or edema  NEUROLOGIC: awake non-focal.  Telemetry: SR   Labs: Basic Metabolic Panel:  Recent Labs Lab 11/08/13 0415  11/09/13 0400 11/10/13 0425 11/11/13 0408 11/12/13 0330 11/13/13 0400 11/14/13 0511  NA 137  < > 140 137 137 135* 134* 138  K 3.4*  < > 3.7 3.9 3.6* 3.7 4.1 3.9  CL 101  < > 102 99 100 99 98 100  CO2 24  < > 25 26 26 26 23 28   GLUCOSE 137*  < > 161* 143* 144* 158* 163* 125*  BUN 31*  < > 26* 24* 20 21 23  24*  CREATININE 1.26  < > 1.20 1.04 0.88 0.86 0.87 0.90  CALCIUM 8.1*  < > 8.1* 8.0* 8.1* 8.2* 8.5 8.2*  MG 2.0  --  2.0 2.1 2.1  --  2.1  --   PHOS 3.3  --  2.9 2.5 2.5  --  3.0  --   < > = values in this interval not displayed.  Liver Function Tests:  Recent Labs Lab 11/10/13 0425 11/11/13 0408 11/12/13 0330 11/13/13 0400 11/14/13 0511  AST 32 28 30 31 26   ALT 87* 64* 53 50 35  ALKPHOS 110 129* 147* 152* 124*  BILITOT 1.0 1.2 1.1 1.1 1.2  PROT 5.8* 6.0 6.1 6.7 6.0  ALBUMIN 2.3* 2.3* 2.3* 2.6* 2.2*    Recent Labs Lab 11/09/13 1730  AMYLASE 24  No results found for this basename: AMMONIA,  in the last 168 hours  CBC:  Recent Labs Lab 11/10/13 0425 11/11/13 0408 11/12/13 0330 11/13/13 0400 11/14/13 0511  WBC 7.8 6.3 7.2 12.2* 9.2  HGB 8.9* 9.1* 8.9* 9.3* 8.9*  HCT 27.4* 28.1* 27.1* 28.7* 27.8*  MCV 90.4 90.1 88.6 89.1 90.3  PLT 149* 143* 171 224 250    Cardiac Enzymes: No results found for this basename: CKTOTAL, CKMB, CKMBINDEX, TROPONINI,  in the last 168 hours  BNP: BNP (last 3 results)  Recent Labs  09/13/13 1349 09/25/13 0400 11/01/13 1033  PROBNP 3883.0* 7019.0* 877.6*    CBG:  Recent Labs Lab 11/13/13 0833 11/13/13 1135 11/13/13 1549 11/13/13 2117 11/13/13 2331  GLUCAP 152* 118* 143* 144* 131*     Coagulation Studies:  Recent Labs  11/12/13 0330 11/13/13 0400 11/14/13 0511  LABPROT 14.6 15.1 15.2  INR 1.16 1.22 1.23    Other results:   Imaging: Dg Chest Port 1 View  11/13/2013   CLINICAL DATA:  Left ventricular assist device.  EXAM: PORTABLE CHEST - 1 VIEW  COMPARISON:  11/12/2013  FINDINGS: Again noted is a pigtail drain within the right chest. There are persistent pleural and parenchymal densities throughout the right chest. Minimal change from the previous examination. No evidence for a large pneumothorax. Enlarged interstitial lung densities suggest vascular congestion or mild edema. Stable appearance of the cardiac ICD. Central venous catheter tip at the superior cavoatrial junction. Heart size is upper limits of normal and median sternotomy wires are present. The endotracheal tube and feeding tube have been removed. Again noted is a left ventricular assist device.  IMPRESSION: Stable pleural and parenchymal densities in the right chest. A small bore chest tube is present and there is no evidence for a large pneumothorax.  Vascular congestion or interstitial pulmonary edema.  Support apparatuses as described.   Electronically Signed   By: Markus Daft M.D.   On: 11/13/2013 07:29   Dg Abd Portable 1v  11/13/2013   CLINICAL DATA:  Evaluate ileus.  EXAM: PORTABLE ABDOMEN - 1 VIEW  COMPARISON:  DG ABD PORTABLE 1V dated 11/12/2013; DG ABD PORTABLE 1V dated 11/11/2013; DG CHEST 1V PORT dated 11/13/2013  FINDINGS: Single supine view of the abdomen was obtained. Evidence for a rectal tube or probe. Some of the contrast has advanced from the right colon to the pelvis and transverse colon area. There are gas-filled loops of small and large bowel. There is persistent dilatation of the right colon and cecal region.  IMPRESSION: Gas-filled loops of small and large bowel. Findings could be associated with an adynamic ileus. There has been mild progression of the oral contrast.   Electronically  Signed   By: Markus Daft M.D.   On: 11/13/2013 07:36     Assessment:   1. Hemorrhagic shock due to R hemothorax    --s/p R chest tube 3/8 2. 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 CAD 4. Hypothyroidism 5. SBO 6. Acute blood loss anemia 7. PEA arrest 8. Shock liver 9. A/C renal failure, stage III -resolved 10. Ventilatory-dependent respiratory failure 11. Hypokalemia  Plan/Discussion:    Stable overnight. Co-ox stable 59% and no signs of HF, will continue to monitor. Volume status good, CVP 7 will transition to PO lasix 20 mg daily tomorrow. Cr stable and will continue to follow. MAPs 90-98 will try to start low dose ACE-I, lisinopril 2.5 mg. Will hold off on BB currently.  No CXR this am.  Sats improved and maintained sats 95%-98% on 3L overnight. Will continue pulmonary toilet. Check CXR tomorrow.  Ileus improving slowly but belly continues to be distended. +BM last night. Will try to start clear liquids today if he tolerates passes bedside swallow. Continue TPN for now.    Continue to ambulate with PT. Will order CR. Having difficulty sleeping, will transfer to [facility] today and minimize vitals in the evening to promote rest.  Hgb stable. INR 1.23, pharmacy dosing. Will continue heparin.  The patient is critically ill with multiple organ systems failure and requires high complexity decision making for assessment and support, frequent evaluation and titration of therapies, application of advanced monitoring technologies and extensive interpretation of multiple databases.   Critical Care Time devoted to patient care services described in this note is 45 Minutes.  Length of Stay: [place] B NP-C 11/14/2013, 7:02 AM  Advanced Heart Failure Team Pager [phone] (M-F; 7a - 4p)  Please contact [facility] for night-coverage after hours (4p -7a ) and weekends on [email or web address]  Patient seen and examined with Junie Bame, NP. We discussed all aspects of the encounter. I agree  with the assessment and plan as stated above. I have edited the note with my changes.   Daniel Bensimhon,MD 8:28 AM

## 2013-11-14 NOTE — Progress Notes (Signed)
8 Days Post-Op  Subjective: Rt apical chest tube placed 3/13 Chest tube intact Pt extubated; up in chair today Looks great!   Objective: Vital signs in last 24 hours: Temp:  [97.5 F (36.4 C)-98.2 F (36.8 C)] 97.7 F (36.5 C) (03/17 0821) Pulse Rate:  [80-99] 87 (03/17 0900) Resp:  [13-24] 20 (03/17 0900) BP: (92-100)/(0) 94/0 mmHg (03/16 1800) SpO2:  [74 %-100 %] 93 % (03/17 0900) FiO2 (%):  [50 %] 50 % (03/16 1200) Weight:  [69.6 kg (153 lb 7 oz)] 69.6 kg (153 lb 7 oz) (03/17 0500) Last BM Date: 11/13/13  Intake/Output from previous day: 03/16 0701 - 03/17 0700 In: 2520 [I.V.:288; [id number] Out: [id number] [Urine:4355; Drains:110; Chest Tube:40] Intake/Output this shift: Total I/O In: 12.3 [I.V.:12.3] Out: 275 [Urine:275]  PE:  Afeb; vss Up in chair Breathing well Chest tube intact Sl tender site Site clean and dry; No bleeding Output 130 cc yesterday 40 cc today--bloody No air leak; good cough CXR 3/17: improving   Lab Results:   Recent Labs  11/13/13 0400 11/14/13 0511  WBC 12.2* 9.2  HGB 9.3* 8.9*  HCT 28.7* 27.8*  PLT 224 250   BMET  Recent Labs  11/13/13 0400 11/14/13 0511  NA 134* 138  K 4.1 3.9  CL 98 100  CO2 23 28  GLUCOSE 163* 125*  BUN 23 24*  CREATININE 0.87 0.90  CALCIUM 8.5 8.2*   PT/INR  Recent Labs  11/13/13 0400 11/14/13 0511  LABPROT 15.1 15.2  INR 1.22 1.23   ABG  Recent Labs  11/13/13 0445 11/14/13 0455  PHART 7.432 7.466*  HCO3 24.1* 28.9*    Studies/Results: Dg Chest Port 1 View  11/14/2013   CLINICAL DATA:  Follow-up hemothorax, chest tube, post LVAD placement  EXAM: PORTABLE CHEST - 1 VIEW  COMPARISON:  11/13/2013  FINDINGS: Patchy opacity in the right mid lung, corresponding to known hemothorax, improved. Indwelling pigtail drainage catheter. Small right pleural effusion with apical capping. No pneumothorax.  Cardiomegaly with pulmonary vascular congestion. No frank interstitial edema.  LVAD. Left  subclavian ICD. Right IJ venous catheter terminating at the cavoatrial junction.  IMPRESSION: Improving right hemothorax with indwelling pigtail drainage catheter.  Small right pleural effusion with apical capping.  No pneumothorax.   Electronically Signed   By: Julian Hy M.D.   On: 11/14/2013 08:00   Dg Chest Port 1 View  11/13/2013   CLINICAL DATA:  Left ventricular assist device.  EXAM: PORTABLE CHEST - 1 VIEW  COMPARISON:  11/12/2013  FINDINGS: Again noted is Oneill pigtail drain within the right chest. There are persistent pleural and parenchymal densities throughout the right chest. Minimal change from the previous examination. No evidence for Oneill large pneumothorax. Enlarged interstitial lung densities suggest vascular congestion or mild edema. Stable appearance of the cardiac ICD. Central venous catheter tip at the superior cavoatrial junction. Heart size is upper limits of normal and median sternotomy wires are present. The endotracheal tube and feeding tube have been removed. Again noted is Oneill left ventricular assist device.  IMPRESSION: Stable pleural and parenchymal densities in the right chest. Oneill small bore chest tube is present and there is no evidence for Oneill large pneumothorax.  Vascular congestion or interstitial pulmonary edema.  Support apparatuses as described.   Electronically Signed   By: Markus Daft M.D.   On: 11/13/2013 07:29   Dg Abd Portable 1v  11/14/2013   CLINICAL DATA:  Follow-up ileus  EXAM: PORTABLE ABDOMEN - 1 VIEW  COMPARISON:  DG ABD PORTABLE 1V dated 11/13/2013  FINDINGS: There remain loops of mildly distended gas-filled small and large bowel. The previously administered oral contrast has progressed further through the colon with more now in the rectosigmoid. No free extraluminal gas collections are suspected. The bony structures exhibit no acute abnormalities.  IMPRESSION: There is persistent small and large bowel ileus, but there has been mild further progression of the oral  contrast through the colon.   Electronically Signed   By: David  Martinique   On: 11/14/2013 08:25   Dg Abd Portable 1v  11/13/2013   CLINICAL DATA:  Evaluate ileus.  EXAM: PORTABLE ABDOMEN - 1 VIEW  COMPARISON:  DG ABD PORTABLE 1V dated 11/12/2013; DG ABD PORTABLE 1V dated 11/11/2013; DG CHEST 1V PORT dated 11/13/2013  FINDINGS: Single supine view of the abdomen was obtained. Evidence for Oneill rectal tube or probe. Some of the contrast has advanced from the right colon to the pelvis and transverse colon area. There are gas-filled loops of small and large bowel. There is persistent dilatation of the right colon and cecal region.  IMPRESSION: Gas-filled loops of small and large bowel. Findings could be associated with an adynamic ileus. There has been mild progression of the oral contrast.   Electronically Signed   By: Markus Daft M.D.   On: 11/13/2013 07:36    Anti-infectives: Anti-infectives   Start     Dose/Rate Route Frequency Ordered Stop   11/07/13 1800  vancomycin (VANCOCIN) IVPB 750 mg/150 ml premix  Status:  Discontinued     750 mg 150 mL/hr over 60 Minutes Intravenous Every 12 hours 11/07/13 0727 11/12/13 1133   11/06/13 1130  vancomycin (VANCOCIN) 1,250 mg in sodium chloride 0.9 % 250 mL IVPB  Status:  Discontinued     1,250 mg 166.7 mL/hr over 90 Minutes Intravenous To Surgery 11/06/13 1119 11/06/13 1433   11/06/13 1130  cefUROXime (ZINACEF) 1.5 g in dextrose 5 % 50 mL IVPB  Status:  Discontinued     1.5 g 100 mL/hr over 30 Minutes Intravenous To Surgery 11/06/13 1119 11/06/13 1433   11/06/13 1130  cefUROXime (ZINACEF) 750 mg in dextrose 5 % 50 mL IVPB  Status:  Discontinued     750 mg 100 mL/hr over 30 Minutes Intravenous To Surgery 11/06/13 1117 11/06/13 1433   11/06/13 0900  cefUROXime (ZINACEF) 1.5 g in dextrose 5 % 50 mL IVPB     1.5 g 100 mL/hr over 30 Minutes Intravenous 60 min pre-op 11/06/13 0831 11/06/13 1224   11/05/13 2300  vancomycin (VANCOCIN) IVPB 1000 mg/200 mL premix  Status:   Discontinued     1,000 mg 200 mL/hr over 60 Minutes Intravenous Every 24 hours 11/05/13 2221 11/07/13 0727   11/05/13 2300  piperacillin-tazobactam (ZOSYN) IVPB 3.375 g  Status:  Discontinued     3.375 g 12.5 mL/hr over 240 Minutes Intravenous 3 times per day 11/05/13 2221 11/12/13 1133   11/02/13 0130  cefTRIAXone (ROCEPHIN) 1 g in dextrose 5 % 50 mL IVPB  Status:  Discontinued     1 g 100 mL/hr over 30 Minutes Intravenous Every 24 hours 11/02/13 0121 11/05/13 2209   11/01/13 2200  cephALEXin (KEFLEX) capsule 500 mg  Status:  Discontinued     500 mg Oral 2 times daily 11/01/13 2157 11/02/13 0121      Assessment/Plan: s/p Procedure(s): VIDEO ASSISTED THORACOSCOPY (Right) EVACUATION HEMATOMA (Right)  Rt apical chest tube placed 3/13 Better daily Plan per Dr Prescott Gum  LOS: 13 days    Johnny Oneill 11/14/2013

## 2013-11-14 NOTE — Progress Notes (Signed)
Physical Therapy Treatment Patient Details Name: Johnny Oneill MRN: [id number] DOB: [birth year] Today's Date: 11/14/2013 Time: [id number] PT Time Calculation (min): 23 min  PT Assessment / Plan / Recommendation  History of Present Illness Johnny Oneill is a 73 year old male with history of CAD s/p stent to LCX 2004 with re-occlusion of LCX with lateral MI 2009 and repeat stenting, total occlusion of RCA, 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 S/P L TKR 2013. Last Cath 2009..  [name] in Jan. 2014.  Now returns with vomiting weakness.  pt s/p VATS for hematoma evac and with postop respiratory complications and vent.  Extubation 3/15.   PT Comments   Pt placed on battery back up and ambulated in the halls for the first time.  Pt a little slow on the switch over, but wife very proficient with change over and general LVAD knowledge.  Pt worn out after ambulation in the halls.  Follow Up Recommendations  Home health PT     Does the patient have the potential to tolerate intense rehabilitation     Barriers to Discharge        Equipment Recommendations  None recommended by PT    Recommendations for Other Services    Frequency Min 3X/week   Progress towards PT Goals Progress towards PT goals: Progressing toward goals (continue goals)  Plan Current plan remains appropriate    Precautions / Restrictions Precautions Precautions: Fall Precaution Comments: dizziness of late.  PI's fluctuating down into the high 4's   Pertinent Vitals/Pain Pt reported mild dizziness on initial stand up which went away after being up a bit.  PI's down about 1.2 points over resting, but pt generally not aware or symptomatic.   Otherwise vitals stable with sats low normal.    Mobility  Bed Mobility Overal bed mobility: Needs Assistance Bed Mobility: Sit to Supine Sit to supine: Mod assist;+2 for physical  assistance General bed mobility comments: assit with legs and positioning due to fatigue Transfers Overall transfer level: Needs assistance Transfers: Sit to/from Stand Sit to Stand: +2 safety/equipment;Mod assist Stand pivot transfers: Mod assist;+2 safety/equipment General transfer comment: reinforced sternal precautions. Ambulation/Gait Ambulation/Gait assistance: Min assist;+2 safety/equipment Ambulation Distance (Feet): 120 Feet Assistive device:  (pushing w/c) Gait Pattern/deviations: Step-through pattern;Trunk flexed Gait velocity: slow Gait velocity interpretation: Below normal speed for age/gender General Gait Details: weak and guarded gait    Exercises     PT Diagnosis:    PT Problem List:   PT Treatment Interventions:     PT Goals (current goals can now be found in the care plan section) Acute Rehab PT Goals Patient Stated Goal: back home PT Goal Formulation: With patient Time For Goal Achievement: 11/20/13 Potential to Achieve Goals: Good  Visit Information  Last PT Received On: 11/14/13 Assistance Needed: +2 History of Present Illness: Johnny Oneill is a 73 year old male with history of CAD s/p stent to LCX 2004 with re-occlusion of LCX with lateral MI 2009 and repeat stenting, total occlusion of RCA, 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 S/P L TKR 2013. Last Cath 2009..  [name] in Jan. 2014.  Now returns with vomiting weakness.  pt s/p VATS for hematoma evac and with postop respiratory complications and vent.  Extubation 3/15.    Subjective Data  Patient Stated Goal: back  home   Cognition  Cognition Arousal/Alertness: Awake/alert Behavior During Therapy: Flat affect Overall Cognitive Status: Within Functional Limits for tasks assessed    Balance  Balance Overall balance assessment: Needs assistance Sitting-balance support: Single extremity  supported;Bilateral upper extremity supported Sitting balance-Leahy Scale: Poor Sitting balance - Comments: tendency to list posteriorly Standing balance support: Bilateral upper extremity supported Standing balance-Leahy Scale: Poor  End of Session PT - End of Session Equipment Utilized During Treatment: Oxygen Activity Tolerance: Patient tolerated treatment well;Patient limited by fatigue Patient left: in bed;with call bell/phone within reach Nurse Communication: Mobility status   GP     Janifer Gieselman, Tessie Fass 11/14/2013, 3:36 PM 11/14/2013  Donnella Sham, [place] [phone]  (pager)

## 2013-11-14 NOTE — Progress Notes (Signed)
Chaplain gave support by visiting, engaging pt in conversation and through empathic listening.  Pt and his family member expressed thanks for the chaplain's visit.   11/14/13 1400  Clinical Encounter Type  Visited With Patient;Patient and family together  Visit Type Spiritual support   Estelle June, chaplain pager [phone]

## 2013-11-14 NOTE — Progress Notes (Signed)
PARENTERAL NUTRITION CONSULT NOTE: Follow Up Consult  Pharmacy Consult:  TPN Indication: Ileus  Allergies  Allergen Reactions  . Diovan [Valsartan] Other (See Comments)    Hypotension at low dose, hyperkalemia  . Lipitor [Atorvastatin Calcium] Other (See Comments)    Muscle pain    Patient Measurements: Height: _0  (175.3 cm) Weight: 153 lb 7 oz (69.6 kg) IBW/kg (Calculated) : 70.7 Usual Weight: 68 kg  Vital Signs: Temp: 97.7 F (36.5 C) (03/17 0821) Temp src: Oral (03/17 0821) Pulse Rate: 83 (03/17 0830) Intake/Output from previous day: 03/16 0701 - 03/17 0700 In: 2520 [I.V.:288; TPN:2232] Out: 4505 [NMMHW:8088; Drains:110; Chest Tube:40]  Labs:  Recent Labs  11/12/13 0330  11/13/13 0400 11/13/13 1745 11/14/13 0511  WBC 7.2  --  12.2*  --  9.2  HGB 8.9*  --  9.3*  --  8.9*  HCT 27.1*  --  28.7*  --  27.8*  PLT 171  --  224  --  250  APTT 53*  < > 56* 54* 50*  INR 1.16  --  1.22  --  1.23  < > = values in this interval not displayed.   Recent Labs  11/12/13 0330 11/13/13 0400 11/14/13 0511  NA 135* 134* 138  K 3.7 4.1 3.9  CL 99 98 100  CO2 _1 GLUCOSE 158* 163* 125*  BUN 21 23 24*  CREATININE 0.86 0.87 0.90  CALCIUM 8.2* 8.5 8.2*  MG  --  2.1  --   PHOS  --  3.0  --   PROT 6.1 6.7 6.0  ALBUMIN 2.3* 2.6* 2.2*  AST _2 ALT 53 50 35  ALKPHOS 147* 152* 124*  BILITOT 1.1 1.1 1.2   Estimated Creatinine Clearance: 75.2 ml/min (by C-G formula based on Cr of 0.9).    Recent Labs  11/13/13 2117 11/13/13 2331 11/14/13 0819  GLUCAP 144* 131* 121*   Insulin Requirements in the past 24 hours:  7 unit sensitive SSI/24h with CBGs 121-143  Current Nutrition:  Clinimix E 5/15 at goal 723m/hr + lipid emulsion 20% at 193mhr = 1894kcal and 100g protein daily  Nutritional Goals:  1850-2050 kcal and 105-115g protein per RD note 3/16  Assessment: [id number] with history of recent LVAD and discharged on 09/19/13.  Patient returned with N/V and  CT scan revealed SBO.   GI:  SBO was believed to have resolved (had flatus and several BMs)- TPN d/c'd 3/7. Resumed TPN 3/10 for inability to take po's/TFs and high NG output. abdomen still slightly distended but KUB is now normal and is having BMs. To attempt a swallow eval today and may be able to start clear liquids. Prealbumin 8.7 on 3/11- repeat pending. IV Reglan QID, IV PPI.   Endo: hypothyroid on Synthroid (dose increased).  No hx DM - CBGs controlled on SSI  Lytes: Na 138, K 3.9, Phos 3, Mag 2.1, CorCa 9.6  Renal: Scr 0.9, UOP 2.23m85mg/hr  Cards:  s/p LVAD 09/19/13 -Cardiac arrest 3/8 with PEA with R hemothorax. Cardiogenic/hemorrhagic shock. 3/9: right VATS and evacuation of hematoma-hemothorax. Now off Levophed and milrinone.  Hepatobil: Shock liver s/p PEA arrest 3/8. Now normalized with exception of alk phos still slightly elevated at 124. TBili normal at 1.1. Albumin low at 2.2.  Heme: Hgb 9.2, Plts 224  Resp: VDRF. Has R-sided chest tube and large R pleural effusion concerning for hemorrhage (on 3/9 CT). Chest tube with 130 out in 24 hours- improved after  TPA yesterday, likely to be repeated by IR today. FiO2 80% on partial rebreather  ID: antibiotics d/c'd. WBC 7.2. Afebrile.  Anticoag: LVAD. Coumadin PTA with INR 3.5 upon admit. Reversed. Heparin drip for LVAD at 1258m/hr per MD (goal ptt 50-60). INR 1.23  Best Practices: PPI IV, MC  TPN Access: CVC placed 11/02/13  TPN day#: 3/5-3/8, resumed 3/10  Plan: . 1. Continue Clinimix E 5/15 at 873mhr + lipid 20% emulsion at 1083mr 2. Daily IV multivitamin, trace elements MWF d/t national shortage 3. Continue sensitive SSI and CBG checks 4. TPN labs as ordered 5. [facility] Micholas Drumwright, PharmD, BCPS Clinical Pharmacist Pager: [phone]/2015 9:07 AM

## 2013-11-14 NOTE — Progress Notes (Signed)
Report called to receiving RN, pt transported on tele and battery power. Receiving RN at bedside upon arrival, pt vs WNL, GCS 15. Bedside report given, MD notified of wide complex runs prior to transfer, MD stated to place strips on front of chart and they will be reviewed on 2W. Pt family at bedside on 2W.

## 2013-11-15 ENCOUNTER — Inpatient Hospital Stay (HOSPITAL_COMMUNITY): Payer: Medicare Other

## 2013-11-15 DIAGNOSIS — I2510 Atherosclerotic heart disease of native coronary artery without angina pectoris: Secondary | ICD-10-CM

## 2013-11-15 DIAGNOSIS — R5381 Other malaise: Secondary | ICD-10-CM

## 2013-11-15 LAB — GLUCOSE, CAPILLARY
Glucose-Capillary: 112 mg/dL — ABNORMAL HIGH (ref 70–99)
Glucose-Capillary: 113 mg/dL — ABNORMAL HIGH (ref 70–99)
Glucose-Capillary: 116 mg/dL — ABNORMAL HIGH (ref 70–99)
Glucose-Capillary: 120 mg/dL — ABNORMAL HIGH (ref 70–99)
Glucose-Capillary: 122 mg/dL — ABNORMAL HIGH (ref 70–99)
Glucose-Capillary: 129 mg/dL — ABNORMAL HIGH (ref 70–99)

## 2013-11-15 LAB — LACTATE DEHYDROGENASE: LDH: 346 U/L — ABNORMAL HIGH (ref 94–250)

## 2013-11-15 LAB — APTT
aPTT: 35 seconds (ref 24–37)
aPTT: 35 seconds (ref 24–37)

## 2013-11-15 LAB — COMPREHENSIVE METABOLIC PANEL
ALT: 38 U/L (ref 0–53)
AST: 29 U/L (ref 0–37)
Albumin: 2.3 g/dL — ABNORMAL LOW (ref 3.5–5.2)
Alkaline Phosphatase: 145 U/L — ABNORMAL HIGH (ref 39–117)
BUN: 21 mg/dL (ref 6–23)
CO2: 26 mEq/L (ref 19–32)
Calcium: 8.5 mg/dL (ref 8.4–10.5)
Chloride: 100 mEq/L (ref 96–112)
Creatinine, Ser: 0.85 mg/dL (ref 0.50–1.35)
GFR calc Af Amer: >90 mL/min (ref >90)
GFR calc non Af Amer: 86 mL/min — ABNORMAL LOW (ref >90)
Glucose, Bld: 124 mg/dL — ABNORMAL HIGH (ref 70–99)
Potassium: 3.7 mEq/L (ref 3.7–5.3)
Sodium: 139 mEq/L (ref 137–147)
Total Bilirubin: 0.9 mg/dL (ref 0.3–1.2)
Total Protein: 6.1 g/dL (ref 6.0–8.3)

## 2013-11-15 LAB — CBC
HCT: 26.2 % — ABNORMAL LOW (ref 39.0–52.0)
Hemoglobin: 8.4 g/dL — ABNORMAL LOW (ref 13.0–17.0)
MCH: 29 pg (ref 26.0–34.0)
MCHC: 32.1 g/dL (ref 30.0–36.0)
MCV: 90.3 fL (ref 78.0–100.0)
Platelets: 241 10*3/uL (ref 150–400)
RBC: 2.9 MIL/uL — ABNORMAL LOW (ref 4.22–5.81)
RDW: 17.2 % — ABNORMAL HIGH (ref 11.5–15.5)
WBC: 7.7 10*3/uL (ref 4.0–10.5)

## 2013-11-15 LAB — PROTIME-INR
INR: 1.16 (ref 0.00–1.49)
Prothrombin Time: 14.6 seconds (ref 11.6–15.2)

## 2013-11-15 MED ORDER — HEPARIN BOLUS VIA INFUSION
1000.0000 [IU] | Freq: Once | INTRAVENOUS | Status: AC
  Administered 2013-11-15: 1000 [IU] via INTRAVENOUS
  Filled 2013-11-15: qty 1000

## 2013-11-15 MED ORDER — WARFARIN - PHARMACIST DOSING INPATIENT
Freq: Every day | Status: DC
  Administered 2013-11-15 – 2013-11-16 (×2)

## 2013-11-15 MED ORDER — TRACE MINERALS CR-CU-F-FE-I-MN-MO-SE-ZN IV SOLN
INTRAVENOUS | Status: DC
  Administered 2013-11-15: 18:00:00 via INTRAVENOUS
  Filled 2013-11-15: qty 1000

## 2013-11-15 MED ORDER — WARFARIN SODIUM 5 MG PO TABS
5.0000 mg | ORAL_TABLET | Freq: Every day | ORAL | Status: DC
  Administered 2013-11-15: 5 mg via ORAL
  Filled 2013-11-15 (×2): qty 1

## 2013-11-15 MED ORDER — HEPARIN SODIUM (PORCINE) 1000 UNIT/ML IJ SOLN: 1000.0000 [IU] | Freq: Once | INTRAMUSCULAR | Status: DC

## 2013-11-15 MED ORDER — BISACODYL 5 MG PO TBEC
10.0000 mg | DELAYED_RELEASE_TABLET | Freq: Once | ORAL | Status: DC
Start: 2013-11-15 — End: 2013-11-17

## 2013-11-15 MED ORDER — FAT EMULSION 20 % IV EMUL
240.0000 mL | INTRAVENOUS | Status: DC
  Administered 2013-11-15: 240 mL via INTRAVENOUS
  Filled 2013-11-15: qty 250

## 2013-11-15 NOTE — Progress Notes (Signed)
PARENTERAL NUTRITION CONSULT NOTE: Follow Up Consult  Pharmacy Consult:  TPN Indication: Ileus  Allergies  Allergen Reactions  . Diovan [Valsartan] Other (See Comments)    Hypotension at low dose, hyperkalemia  . Lipitor [Atorvastatin Calcium] Other (See Comments)    Muscle pain    Patient Measurements: Height: _0  (175.3 cm) Weight: 158 lb 4.6 oz (71.8 kg) (pt did not want to get up on standing scales) IBW/kg (Calculated) : 70.7 Usual Weight: 68 kg  Vital Signs: Temp: 98.3 F (36.8 C) (03/18 0400) Temp src: Oral (03/18 0400) Pulse Rate: 86 (03/18 0400) Intake/Output from previous day: 03/17 0701 - 03/18 0700 In: 3209.8 [I.V.:1349.8; TPN:1860] Out: 1665 [Urine:1630; Drains:35]  Labs:  Recent Labs  11/13/13 0400  11/14/13 0511 11/14/13 1820 11/15/13 0600  WBC 12.2*  --  9.2  --  7.7  HGB 9.3*  --  8.9*  --  8.4*  HCT 28.7*  --  27.8*  --  26.2*  PLT 224  --  250  --  241  APTT 56*  < > 50* 54* 35  INR 1.22  --  1.23  --  1.16  < > = values in this interval not displayed.   Recent Labs  11/13/13 0400 11/14/13 0511 11/14/13 1020 11/14/13 1025 11/15/13 0600  NA 134* 138  --   --  139  K 4.1 3.9  --   --  3.7  CL 98 100  --   --  100  CO2 23 28  --   --  26  GLUCOSE 163* 125*  --   --  124*  BUN 23 24*  --   --  21  CREATININE 0.87 0.90  --   --  0.85  CALCIUM 8.5 8.2*  --   --  8.5  MG 2.1  --   --   --   --   PHOS 3.0  --   --   --   --   PROT 6.7 6.0  --   --  6.1  ALBUMIN 2.6* 2.2*  --   --  2.3*  AST 31 26  --   --  29  ALT 50 35  --   --  38  ALKPHOS 152* 124*  --   --  145*  BILITOT 1.1 1.2  --   --  0.9  PREALBUMIN  --   --  11.7*  --   --   TRIG  --   --   --  93  --    Estimated Creatinine Clearance: 80.9 ml/min (by C-G formula based on Cr of 0.85).    Recent Labs  11/15/13 0013 11/15/13 0420 11/15/13 0800  GLUCAP 116* 122* 113*   Insulin Requirements in the past 24 hours:  7 unit sensitive SSI/24h with CBGs 121-143  Current  Nutrition:  -Clinimix E 5/15 at goal 83m/hr + lipid emulsion 20% at 161mhr = 1894kcal and 100g protein daily -on full liquid yesterday afternoon-evening with good appetite noted. Progressing to dysphagia 3 diet today  Nutritional Goals:  1850-2050 kcal and 105-115g protein per RD note 3/16  Assessment: [id number] with history of recent LVAD and discharged on 09/19/13.  Patient returned with N/V and CT scan revealed SBO.   GI: SBO was believed to have resolved (had flatus and several BMs)- TPN d/c'd 3/7. Resumed TPN 3/10 for inability to take po's/TFs and high NG output. abdomen still slightly distended but KUB is now normal and is  having BMs. Prealbumin 8.7 on 3/11- repeat pending. IV Reglan QID, IV PPI. Advanced diet to dys3 today. Having BMs and KUB normal- Per CVTS to wean off TPN  Endo: hypothyroid on Synthroid (dose increased).  No hx DM - CBGs controlled on SSI  Lytes: Na 139, K 3.7, Phos 3, Mag 2.1, CorCa 9.6  Renal: Scr 0.85, est CrCl ~13m/min, UOP 0.911mkg/hr  Cards:  s/p LVAD 09/19/13 -Cardiac arrest 3/8 with PEA with R hemothorax. Cardiogenic/hemorrhagic shock. 3/9: right VATS and evacuation of hematoma-hemothorax. Now off Levophed and milrinone.  Hepatobil: Shock liver s/p PEA arrest 3/8. Now normalized with exception of alk phos still slightly elevated at 124. TBili normal at 1.1. Albumin low at 2.2.  Heme: Hgb 9.2, Plts 224  Resp: VDRF. Has R-sided chest tube and large R pleural effusion concerning for hemorrhage (on 3/9 CT). Chest tube with 130 out in 24 hours- improved after TPA yesterday, likely to be repeated by IR today. FiO2 80% on partial rebreather  ID: antibiotics d/c'd. WBC 7.7. Afebrile.  Anticoag: LVAD. Coumadin PTA with INR 3.5 upon admit- reversed. Heparin drip for LVAD at 140062mr per MD (goal ptt 50-60). INR 1.16 back on warfarin  Best Practices: PPI IV, MC  TPN Access: CVC placed 11/02/13  TPN day#: 3/5-3/8, resumed 3/10  Plan: . 1. Clarified weaning  with Dr. VanPrescott Gumill reduce rate of Clinimix E 5/15 to 45m60m + lipid 20% emulsion at 10mL54mwith tonight's new bag. This will provide 48g of protein and 1200kcal. Will see how patient does with dys3 diet and hopefully will be able to stop TPN tomorrow. 2. Daily IV multivitamin, trace elements MWF d/t national shortage 3. Continue sensitive SSI and CBG checks while on TPN 4. TPN labs as ordered 5. Continue NS at KVO  [facility]ajbus, PharmD, BCPS Clinical Pharmacist Pager: [phone] 10:07 AM

## 2013-11-15 NOTE — Progress Notes (Signed)
Advanced Heart Failure Team Rounding Note     Subjective:    Johnny Oneill is a 73 year old patient with a history of CAD and HF s/p LVAD HMII 09/19/2013 currently [facility] Transplant list .   Admitted to [facility] 09/13/13 through 10/03/13. Initially admitted with decompenstaed heart failure and low output. Required IABP for stabilization. He ultimately had LVAD HM II placed on 09/19/13. He suffered from RV failure after the procedure and required milrinone for >7 days which was gradually weaned off. D/C weight 151 pounds. f  Admitted on 3/4 with SBO/ileus. Was improving then had cardiac arrest last night (3/8) with PEA in setting of R hemothorax. Now s/p R chest tube and multiple units transfusion. Underwent R VATs 3/9 then bronch on 3/10. On 3/13 underwent placement of R chest drain by IR. Output has slowed down so TPA has been placed and now improved.   Transferred to floor yesterday. Stable overnight. Weight up 5 lb. 24 hr I/O even.  Minimal CT output.  MAPs 84 - 90. Remains on heparin. INR 1.16. Denies SOB. Having BMs. Able to eat some. Ambulated 120 feet yesterday; 150 feet today. Plan IP rehab consult.  LVAD INTERROGATION:  HeartMate II LVAD: Flow 456 liters/min, speed 9400, power 5.6 PI 6.2; no PI events  I reviewed the LVAD parameters from today, and compared the results to the patient's prior recorded data. No programming changes were made. The LVAD is functioning within specified parameters. The patient performs LVAD self-test daily. LVAD interrogation was negative for any significant power changes, alarms or PI events/speed drops. LVAD equipment check completed and is in good working order. Back-up equipment present. CVPm 7 Physical Exam: GENERAL: Fatigued appearing; conversant HEENT: normal   NECK: Supple CARDIAC: Mechanical heart sounds with LVAD hum present.  LUNGS: R chest tube. Decreased on R ABDOMEN: soft nontender,  Bowel sounds good LVAD exit site: well-healed and incorporated. Dressing dry  and intact. No erythema or drainage. Stabilization device present and accurately applied. Driveline dressing is being changed daily per sterile technique.  EXTREMITIES: Warm and dry, no cyanosis, clubbing, rash or edema  NEUROLOGIC: awake non-focal.  Telemetry: SR   Labs: Basic Metabolic Panel:  Recent Labs Lab 11/09/13 0400 11/10/13 0425 11/11/13 0408 11/12/13 0330 11/13/13 0400 11/14/13 0511 11/15/13 0600  NA 140 137 137 135* 134* 138 139  K 3.7 3.9 3.6* 3.7 4.1 3.9 3.7  CL 102 99 100 99 98 100 100  CO2 25 26 26 26 23 28 26   GLUCOSE 161* 143* 144* 158* 163* 125* 124*  BUN 26* 24* 20 21 23  24* 21  CREATININE 1.20 1.04 0.88 0.86 0.87 0.90 0.85  CALCIUM 8.1* 8.0* 8.1* 8.2* 8.5 8.2* 8.5  MG 2.0 2.1 2.1  --  2.1  --   --   PHOS 2.9 2.5 2.5  --  3.0  --   --     Liver Function Tests:  Recent Labs Lab 11/11/13 0408 11/12/13 0330 11/13/13 0400 11/14/13 0511 11/15/13 0600  AST 28 30 31 26 29   ALT 64* 53 50 35 38  ALKPHOS 129* 147* 152* 124* 145*  BILITOT 1.2 1.1 1.1 1.2 0.9  PROT 6.0 6.1 6.7 6.0 6.1  ALBUMIN 2.3* 2.3* 2.6* 2.2* 2.3*    Recent Labs Lab 11/09/13 1730  AMYLASE 24   No results found for this basename: AMMONIA,  in the last 168 hours  CBC:  Recent Labs Lab 11/11/13 0408 11/12/13 0330 11/13/13 0400 11/14/13 0511 11/15/13 0600  WBC 6.3 7.2  12.2* 9.2 7.7  HGB 9.1* 8.9* 9.3* 8.9* 8.4*  HCT 28.1* 27.1* 28.7* 27.8* 26.2*  MCV 90.1 88.6 89.1 90.3 90.3  PLT 143* 171 224 250 241    Cardiac Enzymes: No results found for this basename: CKTOTAL, CKMB, CKMBINDEX, TROPONINI,  in the last 168 hours  BNP: BNP (last 3 results)  Recent Labs  09/13/13 1349 09/25/13 0400 11/01/13 1033  PROBNP 3883.0* 7019.0* 877.6*    CBG:  Recent Labs Lab 11/14/13 2213 11/15/13 0013 11/15/13 0420 11/15/13 0800 11/15/13 1129  GLUCAP 134* 116* 122* 113* 120*    Coagulation Studies:  Recent Labs  11/13/13 0400 11/14/13 0511 11/15/13 0600  LABPROT  15.1 15.2 14.6  INR 1.22 1.23 1.16    Other results:   Imaging: Dg Chest Port 1 View  11/15/2013   CLINICAL DATA:  Right apical pigtail catheter removal. Left ventricular assist device due to ischemic cardiomyopathy.  EXAM: PORTABLE CHEST - 1 VIEW  COMPARISON:  DG CHEST 1V PORT dated 11/15/2013; DG CHEST 1V PORT dated 11/14/2013  FINDINGS: The right-sided pigtail catheter has been removed. Stable airspace opacity above the minor fissure and stable pleural thickening on the right. No pneumothorax.  Right IJ line tip:  SVC.  AICD noted.  The LVAD is partially seen.  Stable indistinct interstitial accentuation in the lung bases along with cardiomegaly.  IMPRESSION: 1. The right pleural drainage catheter has been removed but the radiographic appearance of the chest is otherwise stable. No pneumothorax or specific complicating feature related to the drainage catheter removal observed.   Electronically Signed   By: Sherryl Barters M.D.   On: 11/15/2013 11:54   Dg Chest Port 1 View  11/15/2013   CLINICAL DATA:  LVAD.  Chest tubes.  EXAM: PORTABLE CHEST - 1 VIEW  COMPARISON:  11/14/2013  FINDINGS: Left ventricular assist device remains in place. A left-sided dual lead ICD remains in place. Prior median sternotomy is noted. Right jugular central venous catheter is unchanged with tip overlying the lower SVC. Right pleural pigtail catheter projects over the right mid lung. The cardiac silhouette remains enlarged, unchanged. Small right pleural effusion does not appear significantly changed, including a more focal, possibly loculated component along the lateral aspect of the mid right hemithorax. No pneumothorax is identified. Mild parenchymal opacity in the right mid lung may have slightly increased.  IMPRESSION: 1. Unchanged position of support devices. 2. Unchanged right pleural effusion with pleural catheter in place. 3. Stable to slightly increased parenchymal opacity in the right mid to lower lung.    Electronically Signed   By: Logan Bores   On: 11/15/2013 09:07   Dg Chest Port 1 View  11/14/2013   CLINICAL DATA:  Follow-up hemothorax, chest tube, post LVAD placement  EXAM: PORTABLE CHEST - 1 VIEW  COMPARISON:  11/13/2013  FINDINGS: Patchy opacity in the right mid lung, corresponding to known hemothorax, improved. Indwelling pigtail drainage catheter. Small right pleural effusion with apical capping. No pneumothorax.  Cardiomegaly with pulmonary vascular congestion. No frank interstitial edema.  LVAD. Left subclavian ICD. Right IJ venous catheter terminating at the cavoatrial junction.  IMPRESSION: Improving right hemothorax with indwelling pigtail drainage catheter.  Small right pleural effusion with apical capping.  No pneumothorax.   Electronically Signed   By: Julian Hy M.D.   On: 11/14/2013 08:00   Dg Abd Portable 1v  11/14/2013   CLINICAL DATA:  Follow-up ileus  EXAM: PORTABLE ABDOMEN - 1 VIEW  COMPARISON:  DG ABD PORTABLE 1V  dated 11/13/2013  FINDINGS: There remain loops of mildly distended gas-filled small and large bowel. The previously administered oral contrast has progressed further through the colon with more now in the rectosigmoid. No free extraluminal gas collections are suspected. The bony structures exhibit no acute abnormalities.  IMPRESSION: There is persistent small and large bowel ileus, but there has been mild further progression of the oral contrast through the colon.   Electronically Signed   By: David  Martinique   On: 11/14/2013 08:25     Assessment:   1. Hemorrhagic shock due to R hemothorax    --s/p R chest tube 3/8 2. 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 CAD 4. Hypothyroidism 5. SBO 6. Acute blood loss anemia 7. PEA arrest 8. Shock liver 9. A/C renal failure, stage III -resolved 10. Ventilatory-dependent respiratory failure 11. Hypokalemia  Plan/Discussion:    Much improved. HF stable. Ileus resolving. Agree with stopping TPN later today.   Volume  status looks good. Agree with CIR consult.   Anti-coagulation management per Dr. Prescott Gum and pharmacy. Likely need to be a bit more aggressive. LDH remains low fortunately.   VAD interrogation looks good.   Length of Stay: [place]  11/15/2013, 4:28 PM  Advanced Heart Failure Team Pager [phone] (M-F; 7a - 4p)  Please contact [facility] for night-coverage after hours (4p -7a ) and weekends on [email or web address]

## 2013-11-15 NOTE — Progress Notes (Signed)
Helped remove chest tube with PA.

## 2013-11-15 NOTE — Progress Notes (Signed)
Successful removal of 44F right apical pleural pigtail catheter. No immediate complications, CXR ordered.  Tsosie Billing PA-C Interventional Radiology  11/15/13  11:55 AM

## 2013-11-15 NOTE — Consult Note (Signed)
Physical Medicine and Rehabilitation Consult Reason for Consult: Deconditioning/VATS/LVAD 09/19/2013 Referring Physician: Haroldine Laws   HPI: Johnny Oneill is a 73 y.o. 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 Presented 11/01/2013 after followup in the heart failure clinic with syncopal episode as well as vomiting and lethargy. CT of the abdomen consistent with small bowel obstruction. A nasogastric tube was placed and followup per Gen. surgery. TPN was initiated for nutritional support. Hospital course complicated by cardiac arrest 11/05/2013 with PEA in the setting of right hemothorax with right chest tube placed 11/06/2013 as well as VATS procedure and subsequent interventional radiology pigtail drainage of loculated right apical hematoma. Patient small bowel obstruction ileus continues to improve with diet advanced to mechanical soft. Patient did require intubation until 11/12/2013. Therapies have been initiated with progressive gains ambulating 120 feet pushing his wheelchair. M.D. has requested physical medicine rehabilitation consult   Review of Systems  Respiratory: Positive for shortness of breath.   Cardiovascular: Positive for leg swelling.  Gastrointestinal: Positive for nausea and vomiting.  Musculoskeletal: Positive for myalgias.  Neurological:       Syncope  Psychiatric/Behavioral:       Anxiety  All other systems reviewed and are negative.   Past Medical History  Diagnosis Date  . Ischemic cardiomyopathy     a. 10/09 Echo: Sev LV Dysfxn, inf/lat AK, Mod MR.  [name];  s/p Lat MI 2009 with occlusion of the LCX - treated with Promus stenting. Has total occlusion of the RCA.   .  ICD (implantable cardiac defibrillator) in place     PROPHYLACTIC      medtronic  . RBBB   . 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 Hypertension   . PVC (premature ventricular contraction)   . Pacemaker   . CHF (congestive heart failure)   . Shortness of breath   . Automatic implantable cardioverter-defibrillator in situ    Past Surgical History  Procedure Laterality Date  . Defibrillator  2009  . Total knee arthroplasty  11/27/11    left  . Insert / replace / remove pacemaker  2009  . Coronary angioplasty with stent placement  2004  . Coronary angioplasty with stent placement  07/2008  . Knee arthroscopy w/ partial medial meniscectomy  12/2002    left  . Knee arthroscopy      bilaterally  . Total knee arthroplasty  11/27/2011    Procedure: TOTAL KNEE ARTHROPLASTY;  Surgeon: Yvette Rack., MD;  Location: [place];  Service: Orthopedics;  Laterality: Left;  left total knee arthroplasty  . Esophagogastroduodenoscopy N/A 09/15/2013    Procedure: ESOPHAGOGASTRODUODENOSCOPY (EGD);  Surgeon: Ladene Artist, MD;  Location: [facility] ENDOSCOPY;  Service: Endoscopy;  Laterality: N/A;  bedside  . Insertion of implantable left ventricular assist device N/A 09/18/2013    Procedure: INSERTION OF IMPLANTABLE LEFT VENTRICULAR ASSIST DEVICE;  Surgeon: Ivin Poot, MD;  Location: [place];  Service: Open Heart Surgery;  Laterality: N/A;  CIRC ARREST  NITRIC OXIDE  . Intraoperative transesophageal echocardiogram N/A 09/18/2013    Procedure: INTRAOPERATIVE TRANSESOPHAGEAL ECHOCARDIOGRAM;  Surgeon: Ivin Poot, MD;  Location: [place];  Service: Open Heart Surgery;  Laterality: N/A;  . Video assisted thoracoscopy Right 11/06/2013    Procedure: VIDEO ASSISTED THORACOSCOPY;  Surgeon: Gaye Pollack, MD;  Location: [place];  Service: Cardiothoracic;  Laterality: Right;  . Hematoma evacuation Right 11/06/2013    Procedure: EVACUATION HEMATOMA;  Surgeon: Gaye Pollack, MD;  Location: [place];  Service: Cardiothoracic;  Laterality:  Right;   Family History  Problem Relation Age of Onset  . Heart failure Father   . Stroke Father   . Coronary artery disease Mother   . Heart disease Mother   . Hyperlipidemia Sister    Social History:  reports that he has never smoked. He has never used smokeless tobacco. He reports that he drinks alcohol. He reports that he does not use illicit drugs. Allergies:  Allergies  Allergen Reactions  . Diovan [Valsartan] Other (See Comments)    Hypotension at low dose, hyperkalemia  . Lipitor [Atorvastatin Calcium] Other (See Comments)    Muscle pain   Medications Prior to Admission  Medication Sig Dispense Refill  . amiodarone (PACERONE) 200 MG tablet Take 1 tablet (200 mg total) by mouth 2 (two) times daily.  60 tablet  6  . amLODipine (NORVASC) 10 MG tablet Take 1 tablet (10 mg total) by mouth daily.  30 tablet  3  . aspirin 325 MG tablet Take 325 mg by mouth daily.        . cephALEXin (KEFLEX) 500 MG capsule Take 500 mg by mouth 2 (two) times daily. 5 day course filled 11/01/12      . Coenzyme Q10 (COQ10) 200 MG CAPS Take 200 mg by mouth daily.      [name] ezetimibe (ZETIA) 10 MG tablet Take 10 mg by mouth daily.      . ferrous [id number] C-folic acid (TRINSICON / FOLTRIN) capsule Take 1 capsule by mouth every morning.  30 capsule  3  . ibuprofen (ADVIL,MOTRIN) 200 MG tablet Take 200 mg by mouth daily as needed (pain).      [name] levothyroxine (SYNTHROID, LEVOTHROID) 150 MCG tablet Take 75-150 mcg by mouth daily before breakfast. Take 1 tablet (150 mcg) daily on an empty stomach EXCEPT  take 1/2 tablet (75 mcg) every Friday      . nitroGLYCERIN (NITROSTAT) 0.4 MG SL tablet Place 0.4 mg under the tongue every 5 (five) minutes as needed for chest pain.       . Omega-3 300 MG CAPS Take 300 mg by mouth at bedtime.       . pantoprazole (PROTONIX) 40 MG tablet Take 40 mg by mouth daily.      . rosuvastatin (CRESTOR) 20 MG tablet Take 20 mg by mouth daily.      [name] warfarin (COUMADIN) 5 MG  tablet Take 5 mg by mouth daily at 6 PM. Take 1/2 tablet (2.5 mg) on Tues, Thurs, Saturday, take 1 tablet (5 mg) on Sunday, Monday, Wednesday, Friday      . clindamycin (CLEOCIN) 150 MG capsule Take 600 mg by mouth once.         Home: Home Living Family/patient expects to be discharged to:: Private residence Living Arrangements: Spouse/significant other Available Help at Discharge: Family Type of Home: House Home Access: Stairs to enter [employer] of Steps: 2 Entrance Stairs-Rails: None Home Layout: Two level;Able to live on main level with bedroom/bathroom Alternate Level Stairs-Number of Steps: flight Alternate Level Stairs-Rails: Left;Right Home Equipment: Walker - 2 wheels;Tub bench;Shower seat  Functional History: Prior Function Comments: driving, out and about Functional Status:  Mobility:     Ambulation/Gait Ambulation Distance (Feet): 120 Feet Gait velocity: slow General Gait Details: weak and guarded gait    ADL:    Cognition: Cognition Overall Cognitive Status: Within Functional Limits for tasks assessed Orientation Level: Oriented X4 Cognition Arousal/Alertness: Awake/alert Behavior During Therapy: Flat affect Overall Cognitive Status: Within Functional Limits for tasks assessed  Blood pressure 94/0, pulse 86, temperature 98.3 F (36.8 C), temperature source Oral, resp. rate 20, height 5\' 9"  (1.753 m), weight 71.8 kg (158 lb 4.6 oz), SpO2 97.00%. Physical Exam  Vitals reviewed. Constitutional: He is oriented to person, place, and time. He appears well-developed.  73 year old male. Frail appearing  HENT:  Head: Normocephalic and atraumatic.  Eyes: EOM are normal.  Neck: Normal range of motion. Neck supple. No JVD present. No tracheal deviation present. No thyromegaly present.  Cardiovascular:  Cardiac rate control  Respiratory: Breath sounds normal. No respiratory distress.  GI: Soft. Bowel sounds are normal. He exhibits no distension.    Lymphadenopathy:    He has no cervical adenopathy.  Neurological: He is alert and oriented to person, place, and time.  Motor 5/5 UE, LE 4- prox to 4+ distally. Fatigues easily. No gross sensory findings  Skin:  Pigtail catheter in place, LVAD,   Psychiatric: He has a normal mood and affect. His behavior is normal. Judgment and thought content normal.    Results for orders placed during the hospital encounter of 11/01/13 (from the past 24 hour(s))  PREALBUMIN     Status: Abnormal   Collection Time    11/14/13 10:20 AM      Result Value Ref Range   Prealbumin 11.7 (*) 17.0 - 34.0 mg/dL  TRIGLYCERIDES     Status: None   Collection Time    11/14/13 10:25 AM      Result Value Ref Range   Triglycerides 93  <150 mg/dL  GLUCOSE, CAPILLARY     Status: Abnormal   Collection Time    11/14/13  1:22 PM      Result Value Ref Range   Glucose-Capillary 135 (*) 70 - 99 mg/dL  APTT     Status: Abnormal   Collection Time    11/14/13  6:20 PM      Result Value Ref Range   aPTT 54 (*) 24 - 37 seconds  GLUCOSE, CAPILLARY     Status: Abnormal   Collection Time    11/14/13  6:24 PM      Result Value Ref Range   Glucose-Capillary 118 (*) 70 - 99 mg/dL   Comment 1 Notify RN     Comment 2 Documented in Chart    GLUCOSE, CAPILLARY     Status: Abnormal   Collection Time    11/14/13 10:13 PM      Result Value Ref Range   Glucose-Capillary 134 (*) 70 - 99 mg/dL  GLUCOSE, CAPILLARY     Status: Abnormal   Collection Time    11/15/13 12:13 AM      Result Value Ref Range   Glucose-Capillary 116 (*) 70 - 99 mg/dL  GLUCOSE, CAPILLARY     Status: Abnormal   Collection Time    11/15/13  4:20 AM      Result Value Ref Range   Glucose-Capillary 122 (*) 70 - 99 mg/dL  COMPREHENSIVE METABOLIC PANEL     Status: Abnormal   Collection Time    11/15/13  6:00 AM      Result Value Ref Range   Sodium 139  137 - 147 mEq/L   Potassium 3.7  3.7 - 5.3 mEq/L   Chloride 100  96 - 112 mEq/L   CO2 26  19 - 32  mEq/L   Glucose, Bld 124 (*) 70 - 99 mg/dL   BUN 21  6 - 23 mg/dL   Creatinine, Ser 0.85  0.50 - 1.35 mg/dL   Calcium 8.5  8.4 - 10.5 mg/dL   Total Protein 6.1  6.0 - 8.3 g/dL   Albumin 2.3 (*) 3.5 - 5.2 g/dL   AST 29  0 - 37 U/L   ALT 38  0 - 53 U/L   Alkaline Phosphatase 145 (*) 39 - 117 U/L   Total Bilirubin 0.9  0.3 - 1.2 mg/dL   GFR calc non Af Amer 86 (*) >90 mL/min   GFR calc Af Amer >90  >90 mL/min  CBC     Status: Abnormal   Collection Time    11/15/13  6:00 AM      Result Value Ref Range   WBC 7.7  4.0 - 10.5 K/uL   RBC 2.90 (*) 4.22 - 5.81 MIL/uL   Hemoglobin 8.4 (*) 13.0 - 17.0 g/dL   HCT 26.2 (*) 39.0 - 52.0 %   MCV 90.3  78.0 - 100.0 fL   MCH 29.0  26.0 - 34.0 pg   MCHC 32.1  30.0 - 36.0 g/dL   RDW 17.2 (*) 11.5 - 15.5 %   Platelets 241  150 - 400 K/uL  PROTIME-INR     Status: None   Collection Time    11/15/13  6:00 AM      Result Value Ref Range   Prothrombin Time 14.6  11.6 - 15.2 seconds   INR 1.16  0.00 - 1.49  LACTATE DEHYDROGENASE     Status: Abnormal   Collection Time    11/15/13  6:00 AM      Result Value Ref Range   LDH 346 (*) 94 - 250 U/L  APTT     Status: None   Collection Time    11/15/13  6:00 AM      Result Value Ref Range   aPTT 35  24 - 37 seconds  GLUCOSE, CAPILLARY     Status: Abnormal   Collection Time    11/15/13  8:00 AM      Result Value Ref Range   Glucose-Capillary 113 (*) 70 - 99 mg/dL   Dg Chest Port 1 View  11/15/2013   CLINICAL DATA:  LVAD.  Chest tubes.  EXAM: PORTABLE CHEST - 1 VIEW  COMPARISON:  11/14/2013  FINDINGS: Left ventricular assist device remains in place. A left-sided dual lead ICD remains in place. Prior median sternotomy is noted. Right jugular central venous catheter is unchanged with tip overlying the lower SVC. Right pleural pigtail catheter projects over the right mid lung. The cardiac silhouette remains enlarged, unchanged. Small right pleural effusion does not appear significantly changed, including a  more focal, possibly loculated component along the lateral aspect of the mid right hemithorax. No pneumothorax is identified. Mild parenchymal opacity in the right mid lung may have slightly increased.  IMPRESSION: 1. Unchanged position of support devices. 2. Unchanged right pleural effusion with pleural catheter in place. 3. Stable to slightly increased parenchymal opacity in the right mid to lower lung.   Electronically Signed   By: Logan Bores   On: 11/15/2013 09:07   Dg Chest Port 1 View  11/14/2013   CLINICAL DATA:  Follow-up  hemothorax, chest tube, post LVAD placement  EXAM: PORTABLE CHEST - 1 VIEW  COMPARISON:  11/13/2013  FINDINGS: Patchy opacity in the right mid lung, corresponding to known hemothorax, improved. Indwelling pigtail drainage catheter. Small right pleural effusion with apical capping. No pneumothorax.  Cardiomegaly with pulmonary vascular congestion. No frank interstitial edema.  LVAD. Left subclavian ICD. Right IJ venous catheter terminating at the cavoatrial junction.  IMPRESSION: Improving right hemothorax with indwelling pigtail drainage catheter.  Small right pleural effusion with apical capping.  No pneumothorax.   Electronically Signed   By: Julian Hy M.D.   On: 11/14/2013 08:00   Dg Abd Portable 1v  11/14/2013   CLINICAL DATA:  Follow-up ileus  EXAM: PORTABLE ABDOMEN - 1 VIEW  COMPARISON:  DG ABD PORTABLE 1V dated 11/13/2013  FINDINGS: There remain loops of mildly distended gas-filled small and large bowel. The previously administered oral contrast has progressed further through the colon with more now in the rectosigmoid. No free extraluminal gas collections are suspected. The bony structures exhibit no acute abnormalities.  IMPRESSION: There is persistent small and large bowel ileus, but there has been mild further progression of the oral contrast through the colon.   Electronically Signed   By: David  Martinique   On: 11/14/2013 08:25    Assessment/Plan: Diagnosis:  deconditioning related to multiple medical, htx on right s/p vats 1. Does the need for close, 24 hr/day medical supervision in concert with the patient's rehab needs make it unreasonable for this patient to be served in a less intensive setting? Yes 2. Co-Morbidities requiring supervision/potential complications: CAD, CHF/ICM with LVAD 3. Due to bladder management, bowel management, safety, skin/wound care, disease management, medication administration, pain management and patient education, does the patient require 24 hr/day rehab nursing? Yes 4. Does the patient require coordinated care of a physician, rehab nurse, PT (1-2 hrs/day, 5 days/week) and OT (1-2 hrs/day, 5 days/week) to address physical and functional deficits in the context of the above medical diagnosis(es)? Yes Addressing deficits in the following areas: balance, endurance, locomotion, strength, transferring, bowel/bladder control, bathing, dressing, feeding, grooming, toileting, psychosocial support and stamina 5. Can the patient actively participate in an intensive therapy program of at least 3 hrs of therapy per day at least 5 days per week? Yes 6. The potential for patient to make measurable gains while on inpatient rehab is excellent 7. Anticipated functional outcomes upon discharge from inpatient rehab are mod I with PT, mod I to set up with OT, n/a with SLP. 8. Estimated rehab length of stay to reach the above functional goals is: one week 9. Does the patient have adequate social supports to accommodate these discharge functional goals? Yes 10. Anticipated D/C setting: Home 11. Anticipated post D/C treatments: [place] therapy 12. Overall Rehab/Functional Prognosis: excellent  RECOMMENDATIONS: This patient's condition is appropriate for continued rehabilitative care in the following setting: CIR Patient has agreed to participate in recommended program. Yes Note that insurance prior authorization may be required for reimbursement  for recommended care.  Comment: Pt/wife not quite able to manage at home. Pt still fatigues quite easily. Would benefit from ongoing cardiac/medical mgt while on rehab as well. D/w cardiac team regarding when he can come off monitor. Rehab Admissions Coordinator to follow up.  Thanks,  Meredith Staggers, MD, Mellody Drown     11/15/2013

## 2013-11-15 NOTE — Progress Notes (Signed)
CARDIAC REHAB PHASE I   PRE:  Rate/Rhythm: 86 SR    BP: sitting 78    SaO2: 97 2 1/2 L  MODE:  Ambulation: 150 ft   POST:  Rate/Rhythm: 98    BP: sitting 86     SaO2: 95 2L  Pt able to don batteries in preparation to walk. Needs his belt for the system controller, put it in his hunting vest pocket but this is not ideal. Wife to bring belt. Stood with min assist x2. Able to walk fairly steady with rests x2 standing. Denied dizziness today, VSS. Pt still feels weak/tired. Will f/u. Return to bed to have CT removed. Assisted with legs into bed. [id number]   Josephina Shih Dandridge CES, ACSM 11/15/2013 9:59 AM

## 2013-11-15 NOTE — Progress Notes (Signed)
ANTICOAGULATION CONSULT NOTE - Follow-Up Consult  Pharmacy Consult for Coumadin/Heparin Indication: LVAD  Allergies  Allergen Reactions  . Diovan [Valsartan] Other (See Comments)    Hypotension at low dose, hyperkalemia  . Lipitor [Atorvastatin Calcium] Other (See Comments)    Muscle pain    Patient Measurements: Height: 5\' 9"  (175.3 cm) Weight: 158 lb 4.6 oz (71.8 kg) (pt did not want to get up on standing scales) IBW/kg (Calculated) : 70.7 Heparin Dosing Weight: n/a  Vital Signs: Temp: 97.8 F (36.6 C) (03/18 1500) Temp src: Oral (03/18 1500)  Labs:  Recent Labs  11/13/13 0400  11/14/13 0511 11/14/13 1820 11/15/13 0600 11/15/13 1815  HGB 9.3*  --  8.9*  --  8.4*  --   HCT 28.7*  --  27.8*  --  26.2*  --   PLT 224  --  250  --  241  --   APTT 56*  < > 50* 54* 35 35  LABPROT 15.1  --  15.2  --  14.6  --   INR 1.22  --  1.23  --  1.16  --   CREATININE 0.87  --  0.90  --  0.85  --   < > = values in this interval not displayed.  Estimated Creatinine Clearance: 80.9 ml/min (by C-G formula based on Cr of 0.85).   Medical History: Past Medical History  Diagnosis Date  . Ischemic cardiomyopathy     a. 10/09 Echo: Sev LV Dysfxn, inf/lat AK, Mod MR.  [name];  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 Hypertension   . PVC (premature ventricular contraction)   . Pacemaker   . CHF (congestive heart failure)   . Shortness of breath   . Automatic implantable cardioverter-defibrillator in situ     Medications:  Scheduled:  . antiseptic oral rinse  15 mL Mouth Rinse Q4H  . bisacodyl  10 mg Oral Once  . bisacodyl  10 mg Rectal  Once  . budesonide (PULMICORT) nebulizer solution  0.25 mg Nebulization BID  . furosemide  20 mg Oral Daily  . insulin aspart  0-9 Units Subcutaneous TID WC  . levothyroxine  100 mcg Intravenous Daily  . lisinopril  2.5 mg Oral Daily  . metoCLOPramide (REGLAN) injection  10 mg Intravenous 4 times per day  . pantoprazole (PROTONIX) IV  40 mg Intravenous Daily  . warfarin  5 mg Oral q1800  . Warfarin - Pharmacist Dosing Inpatient   Does not apply q1800  . Warfarin - Physician Dosing Inpatient   Does not apply q1800    Assessment: 73 yo male with hx CAD, and HF s/p LVAD HMII 09/19/13.  Admitted 3/4 with SBO/ileus, and Coumadin was held in setting of R hemothorax.  Now s/p chest tube and multiple units transfusion.  Also had R chest drain placed on 3/13.  Has remained on heparin IV infusion dosed by TCTS with goal PTT 50-60.  Discussed with Dr. Prescott Gum and Dr. Haroldine Laws.  Will continue Coumadin tonight.  Continue  IV heparin as well.  Today's INR near normal at 1.16.  PTT trending down despite increasing heparin gtt rates.  Spoke to RN, PTT drawn from PICC line.  Per Dr. Prescott Gum OK to increase rate a little.  Goal of Therapy:  INR 2-3 Monitor platelets by anticoagulation protocol: Yes   Plan:  1. Coumadin 5 mg po x 1 tonight. 2. Continue daily PT/INR. 3. Increase IV heparin to 1500 units/hr. 4. Recheck PTT with AM labs. 5. Will monitor for s/s bleeding, or any return of hemothorax.  Uvaldo Rising, BCPS  Clinical Pharmacist Pager [phone]  11/15/2013 7:33 PM

## 2013-11-15 NOTE — Progress Notes (Signed)
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 He is currently supported with a right IJ catheter. Femoral  A-line has been removed   Patient developed cardiac arrest from right hemo-thorax tension on right heart and was intubated with ventilator-dependent respiratory failure for 7 days. Now extubated, doing well The chest x-ray shows significant clearing after the procedures and drainage tubes were placed. His bronchial washings are negative and white count normal  The patient's LVAD function has been normal since evacuation of right hemothorax and he is maintained sinus rhythm. He is on heparin infusion while Coumadin is being resumed in until INR becomes therapeutic.  The partial small bowel obstruction is resolved he currently has also resolved his postop ileus.  The patient currently is having BM's with normal KUB--advance to mechanical soft diet  The patient's pulmonarystatus has progressively improved after right VATS and subsequent pigtail catheter drainage and he was weaned and extubated today. He is breathing comfortably with a strong cough.Will remove pigtail today based on chest x-ray improvement and minimal drainage  Patient tolerated his first night on stepdown and rested much better. No problems  LVAD INTERROGATION [facility] II LVAD:  Flow 4.2 liters/min, speed 9400 RPM, power 5.0, PI 5.5  0 PI events   Objective:    Vital Signs:   Temp:  [98.1 F (36.7 C)-98.3 F (36.8 C)] 98.3  F (36.8 C) (03/18 0400) Pulse Rate:  [79-94] 86 (03/18 0400) Resp:  [15-26] 20 (03/18 0400) SpO2:  [94 %-100 %] 97 % (03/18 0400) Weight:  [158 lb 4.6 oz (71.8 kg)] 158 lb 4.6 oz (71.8 kg) (03/18 0400) Last BM Date: 11/14/13 Mean arterial Pressure  82 Intake/Output:   Intake/Output Summary (Last 24 hours) at 11/15/13 0956 Last data filed at 11/15/13 0803  Gross per 24 hour  Intake 3131.5 ml  Output   1390 ml  Net 1741.5 ml     Physical Exam: General:  Well appearing. 9 extubated and responsive breathing comfortably  HEENT: normal Neck: supple. JVP . Carotids 2+ bilat; no bruits. No lymphadenopathy or thryomegaly appreciated. Cor: Mechanical heart sounds with LVAD hum present. Lungs: clear, Right chest tube present and right pigtail catheter present  Abdomen: soft, nontender, mildly distended. No hepatosplenomegaly. No bruits or masses. diminished  bowel sounds. Driveline: C/D/I; securement device intact and driveline incorporated Extremities: no cyanosis, clubbing, rash, mild edema, extremities warm  Neuro: Intact moves all extremities but weak  Telemetry: sinus rhythm  Labs: Basic Metabolic Panel:  Recent Labs Lab 11/09/13 0400 11/10/13 0425 11/11/13 0408 11/12/13 0330 11/13/13 0400 11/14/13 0511 11/15/13 0600  NA 140 137 137 135* 134* 138 139  K 3.7 3.9 3.6* 3.7 4.1 3.9 3.7  CL 102 99 100 99 98 100 100  CO2 25 26 26 26 23 28 26   GLUCOSE 161* 143* 144* 158* 163* 125* 124*  BUN 26* 24* 20 21 23  24* 21  CREATININE 1.20 1.04 0.88 0.86 0.87 0.90 0.85  CALCIUM 8.1* 8.0* 8.1* 8.2* 8.5 8.2* 8.5  MG 2.0 2.1 2.1  --  2.1  --   --  PHOS 2.9 2.5 2.5  --  3.0  --   --     Liver Function Tests:  Recent Labs Lab 11/11/13 0408 11/12/13 0330 11/13/13 0400 11/14/13 0511 11/15/13 0600  AST 28 30 31 26 29   ALT 64* 53 50 35 38  ALKPHOS 129* 147* 152* 124* 145*  BILITOT 1.2 1.1 1.1 1.2 0.9  PROT 6.0 6.1 6.7 6.0 6.1  ALBUMIN 2.3* 2.3* 2.6* 2.2* 2.3*    Recent  Labs Lab 11/09/13 1730  AMYLASE 24   No results found for this basename: AMMONIA,  in the last 168 hours  CBC:  Recent Labs Lab 11/11/13 0408 11/12/13 0330 11/13/13 0400 11/14/13 0511 11/15/13 0600  WBC 6.3 7.2 12.2* 9.2 7.7  HGB 9.1* 8.9* 9.3* 8.9* 8.4*  HCT 28.1* 27.1* 28.7* 27.8* 26.2*  MCV 90.1 88.6 89.1 90.3 90.3  PLT 143* 171 224 250 241    INR:  Recent Labs Lab 11/11/13 0408 11/12/13 0330 11/13/13 0400 11/14/13 0511 11/15/13 0600  INR 1.19 1.16 1.22 1.23 1.16    Other results:  EKG:   Imaging: Dg Chest Port 1 View  11/15/2013   CLINICAL DATA:  LVAD.  Chest tubes.  EXAM: PORTABLE CHEST - 1 VIEW  COMPARISON:  11/14/2013  FINDINGS: Left ventricular assist device remains in place. A left-sided dual lead ICD remains in place. Prior median sternotomy is noted. Right jugular central venous catheter is unchanged with tip overlying the lower SVC. Right pleural pigtail catheter projects over the right mid lung. The cardiac silhouette remains enlarged, unchanged. Small right pleural effusion does not appear significantly changed, including a more focal, possibly loculated component along the lateral aspect of the mid right hemithorax. No pneumothorax is identified. Mild parenchymal opacity in the right mid lung may have slightly increased.  IMPRESSION: 1. Unchanged position of support devices. 2. Unchanged right pleural effusion with pleural catheter in place. 3. Stable to slightly increased parenchymal opacity in the right mid to lower lung.   Electronically Signed   By: Logan Bores   On: 11/15/2013 09:07   Dg Chest Port 1 View  11/14/2013   CLINICAL DATA:  Follow-up hemothorax, chest tube, post LVAD placement  EXAM: PORTABLE CHEST - 1 VIEW  COMPARISON:  11/13/2013  FINDINGS: Patchy opacity in the right mid lung, corresponding to known hemothorax, improved. Indwelling pigtail drainage catheter. Small right pleural effusion with apical capping. No pneumothorax.  Cardiomegaly  with pulmonary vascular congestion. No frank interstitial edema.  LVAD. Left subclavian ICD. Right IJ venous catheter terminating at the cavoatrial junction.  IMPRESSION: Improving right hemothorax with indwelling pigtail drainage catheter.  Small right pleural effusion with apical capping.  No pneumothorax.   Electronically Signed   By: Julian Hy M.D.   On: 11/14/2013 08:00   Dg Abd Portable 1v  11/14/2013   CLINICAL DATA:  Follow-up ileus  EXAM: PORTABLE ABDOMEN - 1 VIEW  COMPARISON:  DG ABD PORTABLE 1V dated 11/13/2013  FINDINGS: There remain loops of mildly distended gas-filled small and large bowel. The previously administered oral contrast has progressed further through the colon with more now in the rectosigmoid. No free extraluminal gas collections are suspected. The bony structures exhibit no acute abnormalities.  IMPRESSION: There is persistent small and large bowel ileus, but there has been mild further progression of the oral contrast through the colon.   Electronically Signed   By: David  Martinique   On: 11/14/2013 08:25     Medications:     Scheduled  Medications: . antiseptic oral rinse  15 mL Mouth Rinse Q4H  . bisacodyl  10 mg Oral Once  . bisacodyl  10 mg Rectal Once  . budesonide (PULMICORT) nebulizer solution  0.25 mg Nebulization BID  . furosemide  20 mg Oral Daily  . insulin aspart  0-9 Units Subcutaneous TID WC  . levothyroxine  100 mcg Intravenous Daily  . lisinopril  2.5 mg Oral Daily  . metoCLOPramide (REGLAN) injection  10 mg Intravenous 4 times per day  . pantoprazole (PROTONIX) IV  40 mg Intravenous Daily  . warfarin  5 mg Oral q1800  . Warfarin - Physician Dosing Inpatient   Does not apply q1800    Infusions: . sodium chloride 20 mL/hr at 11/14/13 1400  . Marland KitchenTPN (CLINIMIX-E) Adult 83 mL/hr at 11/15/13 0656   And  . fat emulsion 240 mL (11/15/13 0656)  . heparin 1,400 Units/hr (11/15/13 0732)    PRN Medications: diphenhydrAMINE, fentaNYL, LORazepam,  ondansetron (ZOFRAN) IV, phenol, sodium chloride   Assessment:   Cont heparin until INR 2.0- starting po coumadin  Systemic anticoagulation with heparin, goal PTT 50-60  Continue systemic antibiotics complete 7-10 days for possible pneumonia  Continue PT, anticipate need for CIR and we'll ask for evaluation  Wean off T. NA per pharmacy  Dry line dressing changes Monday Wednesday Friday Plan/Discussion:      I reviewed the LVAD parameters from today, and compared the results to the patient's prior recorded data.  No programming changes were made.  The LVAD is functioning within specified parameters.  The patient performs LVAD self-test daily.  LVAD interrogation was negative for any significant power changes, alarms or PI events/speed drops.  LVAD equipment check completed and is in good working order.  Back-up equipment present.   LVAD education done on emergency procedures and precautions and reviewed exit site care.  Length of Stay: [place] 11/15/2013, 9:56 AM  VAD Team Pager [phone] (7am - 7am)

## 2013-11-16 ENCOUNTER — Inpatient Hospital Stay (HOSPITAL_COMMUNITY): Payer: Medicare Other

## 2013-11-16 LAB — GLUCOSE, CAPILLARY
Glucose-Capillary: 100 mg/dL — ABNORMAL HIGH (ref 70–99)
Glucose-Capillary: 103 mg/dL — ABNORMAL HIGH (ref 70–99)
Glucose-Capillary: 106 mg/dL — ABNORMAL HIGH (ref 70–99)
Glucose-Capillary: 109 mg/dL — ABNORMAL HIGH (ref 70–99)
Glucose-Capillary: 116 mg/dL — ABNORMAL HIGH (ref 70–99)
Glucose-Capillary: 91 mg/dL (ref 70–99)
Glucose-Capillary: 99 mg/dL (ref 70–99)

## 2013-11-16 LAB — COMPREHENSIVE METABOLIC PANEL
ALT: 32 U/L (ref 0–53)
AST: 24 U/L (ref 0–37)
Albumin: 2.4 g/dL — ABNORMAL LOW (ref 3.5–5.2)
Alkaline Phosphatase: 151 U/L — ABNORMAL HIGH (ref 39–117)
BUN: 18 mg/dL (ref 6–23)
CO2: 25 mEq/L (ref 19–32)
Calcium: 8.4 mg/dL (ref 8.4–10.5)
Chloride: 100 mEq/L (ref 96–112)
Creatinine, Ser: 0.88 mg/dL (ref 0.50–1.35)
GFR calc Af Amer: >90 mL/min (ref >90)
GFR calc non Af Amer: 85 mL/min — ABNORMAL LOW (ref >90)
Glucose, Bld: 104 mg/dL — ABNORMAL HIGH (ref 70–99)
Potassium: 3.4 mEq/L — ABNORMAL LOW (ref 3.7–5.3)
Sodium: 138 mEq/L (ref 137–147)
Total Bilirubin: 0.8 mg/dL (ref 0.3–1.2)
Total Protein: 6.5 g/dL (ref 6.0–8.3)

## 2013-11-16 LAB — CBC
HCT: 26.3 % — ABNORMAL LOW (ref 39.0–52.0)
Hemoglobin: 8.5 g/dL — ABNORMAL LOW (ref 13.0–17.0)
MCH: 29.3 pg (ref 26.0–34.0)
MCHC: 32.3 g/dL (ref 30.0–36.0)
MCV: 90.7 fL (ref 78.0–100.0)
Platelets: 272 10*3/uL (ref 150–400)
RBC: 2.9 MIL/uL — ABNORMAL LOW (ref 4.22–5.81)
RDW: 17.4 % — ABNORMAL HIGH (ref 11.5–15.5)
WBC: 7.6 10*3/uL (ref 4.0–10.5)

## 2013-11-16 LAB — MAGNESIUM: Magnesium: 2.1 mg/dL (ref 1.5–2.5)

## 2013-11-16 LAB — PROTIME-INR
INR: 1.13 (ref 0.00–1.49)
Prothrombin Time: 14.3 seconds (ref 11.6–15.2)

## 2013-11-16 LAB — LACTATE DEHYDROGENASE: LDH: 328 U/L — ABNORMAL HIGH (ref 94–250)

## 2013-11-16 LAB — PHOSPHORUS: Phosphorus: 3.6 mg/dL (ref 2.3–4.6)

## 2013-11-16 LAB — APTT
aPTT: 62 s — ABNORMAL HIGH (ref 24–37)
aPTT: 59 seconds — ABNORMAL HIGH (ref 24–37)

## 2013-11-16 MED ORDER — WARFARIN SODIUM 7.5 MG PO TABS
7.5000 mg | ORAL_TABLET | Freq: Once | ORAL | Status: DC
  Administered 2013-11-16: 7.5 mg via ORAL
  Filled 2013-11-16: qty 1

## 2013-11-16 MED ORDER — SPIRONOLACTONE 12.5 MG HALF TABLET
12.5000 mg | ORAL_TABLET | Freq: Every day | ORAL | Status: DC
  Administered 2013-11-16 – 2013-11-17 (×2): 12.5 mg via ORAL
  Filled 2013-11-16 (×2): qty 1

## 2013-11-16 MED ORDER — ENSURE COMPLETE PO LIQD
237.0000 mL | Freq: Every day | ORAL | Status: DC
  Administered 2013-11-16: 237 mL via ORAL

## 2013-11-16 MED ORDER — FERROUS SULFATE 325 (65 FE) MG PO TABS
325.0000 mg | ORAL_TABLET | Freq: Three times a day (TID) | ORAL | Status: DC
  Administered 2013-11-16 – 2013-11-17 (×4): 325 mg via ORAL
  Filled 2013-11-16 (×6): qty 1

## 2013-11-16 MED ORDER — POTASSIUM CHLORIDE CRYS ER 20 MEQ PO TBCR
20.0000 meq | EXTENDED_RELEASE_TABLET | Freq: Two times a day (BID) | ORAL | Status: AC
  Administered 2013-11-16: 20 meq via ORAL
  Filled 2013-11-16: qty 1

## 2013-11-16 MED ORDER — FUROSEMIDE 40 MG PO TABS
40.0000 mg | ORAL_TABLET | Freq: Every day | ORAL | Status: DC
  Administered 2013-11-16 – 2013-11-17 (×2): 40 mg via ORAL
  Filled 2013-11-16 (×2): qty 1

## 2013-11-16 MED ORDER — POTASSIUM CHLORIDE 10 MEQ/50ML IV SOLN
10.0000 meq | INTRAVENOUS | Status: AC
  Administered 2013-11-16 (×2): 10 meq via INTRAVENOUS
  Filled 2013-11-16 (×2): qty 50

## 2013-11-16 NOTE — H&P (Signed)
Physical Medicine and Rehabilitation Admission H&P    Chief Complaint  Patient presents with  . Emesis  :  Chief complaint: Weakness  HPI: Johnny Oneill is a 73 y.o. 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 Presented 11/01/2013 after followup in the heart failure clinic with syncopal episode as well as vomiting and lethargy. CT of the abdomen consistent with small bowel obstruction. A nasogastric tube was placed and followup per Gen. surgery. TPN was initiated for nutritional support. Hospital course complicated by cardiac arrest 11/05/2013 with PEA in the setting of right hemothorax with right chest tube placed 11/06/2013 as well as VATS procedure and subsequent interventional radiology pigtail drainage of loculated right apical hematoma and chest tube ultimately removed 11/15/2013. Patient small bowel obstruction ileus continues to improve with diet advanced to regular consistency. Patient did require intubation until 11/12/2013. Therapies have been initiated with progressive gains ambulating 120 feet pushing his wheelchair but fatigues greatly. Pt screened by the rehab team and ultimately admitted today.   ROS Review of Systems  Respiratory: Positive for shortness of breath.  Cardiovascular: Positive for leg swelling.  Gastrointestinal: Positive for nausea and vomiting.  Musculoskeletal: Positive for myalgias.  Neurological:  Syncope  Psychiatric/Behavioral:  Anxiety  All other systems reviewed and are negative  Past Medical History  Diagnosis Date  . Ischemic cardiomyopathy     a. 10/09 Echo: Sev LV Dysfxn, inf/lat AK, Mod MR.  [name];  s/p Lat MI 2009 with occlusion of the LCX - treated  with Promus stenting. Has total occlusion of the RCA.   . ICD (implantable cardiac defibrillator) in place     PROPHYLACTIC      medtronic  . RBBB   . 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 Hypertension   . PVC (premature ventricular contraction)   . Pacemaker   . CHF (congestive heart failure)   . Shortness of breath   . Automatic implantable cardioverter-defibrillator in situ    Past Surgical History  Procedure Laterality Date  . Defibrillator  2009  . Total knee arthroplasty  11/27/11    left  . Insert / replace / remove pacemaker  2009  . Coronary angioplasty with stent placement  2004  . Coronary angioplasty with stent placement  07/2008  . Knee arthroscopy w/ partial medial meniscectomy  12/2002    left  . Knee arthroscopy      bilaterally  . Total knee arthroplasty  11/27/2011    Procedure: TOTAL KNEE ARTHROPLASTY;  Surgeon: Yvette Rack., MD;  Location: [place];  Service: Orthopedics;  Laterality: Left;  left total knee arthroplasty  . Esophagogastroduodenoscopy N/A 09/15/2013    Procedure: ESOPHAGOGASTRODUODENOSCOPY (EGD);  Surgeon: Ladene Artist, MD;  Location: [facility] ENDOSCOPY;  Service: Endoscopy;  Laterality: N/A;  bedside  . Insertion of implantable left ventricular assist device N/A 09/18/2013    Procedure: INSERTION OF IMPLANTABLE LEFT VENTRICULAR ASSIST DEVICE;  Surgeon: Ivin Poot, MD;  Location: [place];  Service: Open Heart Surgery;  Laterality: N/A;  CIRC ARREST  NITRIC OXIDE  . Intraoperative transesophageal echocardiogram N/A 09/18/2013    Procedure: INTRAOPERATIVE TRANSESOPHAGEAL ECHOCARDIOGRAM;  Surgeon: Ivin Poot, MD;  Location: [facility] OR;  Service: Open Heart Surgery;  Laterality: N/A;  . Video assisted thoracoscopy Right 11/06/2013    Procedure: VIDEO ASSISTED THORACOSCOPY;  Surgeon: Gaye Pollack, MD;  Location: [place];  Service: Cardiothoracic;  Laterality: Right;  . Hematoma evacuation Right 11/06/2013    Procedure: EVACUATION HEMATOMA;  Surgeon: Gaye Pollack,  MD;  Location: [place];  Service: Cardiothoracic;  Laterality: Right;   Family History  Problem Relation Age of Onset  . Heart failure Father   . Stroke Father   . Coronary artery disease Mother   . Heart disease Mother   . Hyperlipidemia Sister    Social History:  reports that he has never smoked. He has never used smokeless tobacco. He reports that he drinks alcohol. He reports that he does not use illicit drugs. Allergies:  Allergies  Allergen Reactions  . Diovan [Valsartan] Other (See Comments)    Hypotension at low dose, hyperkalemia  . Lipitor [Atorvastatin Calcium] Other (See Comments)    Muscle pain   Medications Prior to Admission  Medication Sig Dispense Refill  . amiodarone (PACERONE) 200 MG tablet Take 1 tablet (200 mg total) by mouth 2 (two) times daily.  60 tablet  6  . amLODipine (NORVASC) 10 MG tablet Take 1 tablet (10 mg total) by mouth daily.  30 tablet  3  . aspirin 325 MG tablet Take 325 mg by mouth daily.        . cephALEXin (KEFLEX) 500 MG capsule Take 500 mg by mouth 2 (two) times daily. 5 day course filled 11/01/12      . Coenzyme Q10 (COQ10) 200 MG CAPS Take 200 mg by mouth daily.      [name] ezetimibe (ZETIA) 10 MG tablet Take 10 mg by mouth daily.      . ferrous [id number] C-folic acid (TRINSICON / FOLTRIN) capsule Take 1 capsule by mouth every morning.  30 capsule  3  . ibuprofen (ADVIL,MOTRIN) 200 MG tablet Take 200 mg by mouth daily as needed (pain).      [name] levothyroxine (SYNTHROID, LEVOTHROID) 150 MCG tablet Take 75-150 mcg by mouth daily before breakfast. Take 1 tablet (150 mcg) daily on an empty stomach EXCEPT  take 1/2 tablet (75 mcg) every Friday      . nitroGLYCERIN (NITROSTAT) 0.4 MG SL tablet Place 0.4 mg under the tongue every 5 (five) minutes as needed for chest pain.       . Omega-3 300 MG CAPS Take 300 mg by mouth at bedtime.       . pantoprazole (PROTONIX) 40 MG tablet Take 40 mg by mouth daily.      . rosuvastatin (CRESTOR) 20 MG tablet  Take 20 mg by mouth daily.      [name] warfarin (COUMADIN) 5 MG tablet Take 5 mg by mouth daily at 6 PM. Take 1/2 tablet (2.5 mg) on Tues, Thurs, Saturday, take 1 tablet (5 mg) on Sunday, Monday, Wednesday, Friday      . clindamycin (CLEOCIN) 150 MG capsule Take 600 mg by mouth once.         Home: Home Living Family/patient expects to be discharged to:: Private residence Living Arrangements: Spouse/significant other Available Help at Discharge: Family Type of Home: House Home Access: Stairs to enter [employer] of Steps: 2 Entrance Stairs-Rails: None Home Layout: Two level;Able to live on main level with bedroom/bathroom Alternate Level Stairs-Number of Steps: flight Alternate Level Stairs-Rails: Left;Right Home Equipment: Walker - 2 wheels;Tub bench;Shower seat   Functional  History: Prior Function Comments: driving, out and about  Functional Status:  Mobility: min assist for mobility and transfers     Ambulation/Gait Ambulation Distance (Feet): 120 Feet Gait velocity: slow General Gait Details: weak and guarded gait    ADL:    Cognition: Cognition Overall Cognitive Status: Within Functional Limits for tasks assessed Orientation Level: Oriented X4 Cognition Arousal/Alertness: Awake/alert Behavior During Therapy: Flat affect Overall Cognitive Status: Within Functional Limits for tasks assessed  Physical Exam: Blood pressure 94/0, pulse 86, temperature 97.7 F (36.5 C), temperature source Oral, resp. rate 18, height $RemoveBe'5\' 9"'[id number]$  (1.753 m), weight 71.8 kg (158 lb 4.6 oz), SpO2 96.00%. Physical Exam Constitutional: He is oriented to person, place, and time. He appears well-developed.  73 year old male. Better color today  HENT:  Head: Normocephalic and atraumatic.  Eyes: EOM are normal.  Neck: Normal range of motion. Neck supple. No JVD present. No tracheal deviation present. No thyromegaly present.  Cardiovascular:  Hum of LVAD auscultated  Respiratory: Breath  sounds normal. No respiratory distress. Slight decrease in sounds on the right.  GI: Soft. Bowel sounds are normal. He exhibits no distension.  Lymphadenopathy:  He has no cervical adenopathy.  Neurological: He is alert and oriented to person, place, and time.  Motor 5/5 UE prox to distal, LE 4- HF, 4 KE, ankles 4+.  Fatigues quickly. No gross sensory findings. CN exam intact. Good insight and awareness. Normal memory. DTR's 1+ Skin:   chest tube sites clean and dry, dressed with minimal drainage. Psychiatric: He has a normal mood and affect. His behavior is normal. Judgment and thought content normal  Results for orders placed during the hospital encounter of 11/01/13 (from the past 48 hour(s))  GLUCOSE, CAPILLARY     Status: Abnormal   Collection Time    11/14/13  8:19 AM      Result Value Ref Range   Glucose-Capillary 121 (*) 70 - 99 mg/dL   Comment 1 Documented in Chart     Comment 2 Notify RN    PREALBUMIN     Status: Abnormal   Collection Time    11/14/13 10:20 AM      Result Value Ref Range   Prealbumin 11.7 (*) 17.0 - 34.0 mg/dL   Comment: Performed at [facility]     Status: None   Collection Time    11/14/13 10:25 AM      Result Value Ref Range   Triglycerides 93  <150 mg/dL  GLUCOSE, CAPILLARY     Status: Abnormal   Collection Time    11/14/13  1:22 PM      Result Value Ref Range   Glucose-Capillary 135 (*) 70 - 99 mg/dL  APTT     Status: Abnormal   Collection Time    11/14/13  6:20 PM      Result Value Ref Range   aPTT 54 (*) 24 - 37 seconds   Comment:            IF BASELINE aPTT IS ELEVATED,     SUGGEST PATIENT RISK ASSESSMENT     BE USED TO DETERMINE APPROPRIATE     ANTICOAGULANT THERAPY.  GLUCOSE, CAPILLARY     Status: Abnormal   Collection Time    11/14/13  6:24 PM      Result Value Ref Range   Glucose-Capillary 118 (*) 70 - 99 mg/dL   Comment 1 Notify RN     Comment 2 Documented in Chart    GLUCOSE,  CAPILLARY     Status:  Abnormal   Collection Time    11/14/13 10:13 PM      Result Value Ref Range   Glucose-Capillary 134 (*) 70 - 99 mg/dL  GLUCOSE, CAPILLARY     Status: Abnormal   Collection Time    11/15/13 12:13 AM      Result Value Ref Range   Glucose-Capillary 116 (*) 70 - 99 mg/dL  GLUCOSE, CAPILLARY     Status: Abnormal   Collection Time    11/15/13  4:20 AM      Result Value Ref Range   Glucose-Capillary 122 (*) 70 - 99 mg/dL  COMPREHENSIVE METABOLIC PANEL     Status: Abnormal   Collection Time    11/15/13  6:00 AM      Result Value Ref Range   Sodium 139  137 - 147 mEq/L   Potassium 3.7  3.7 - 5.3 mEq/L   Chloride 100  96 - 112 mEq/L   CO2 26  19 - 32 mEq/L   Glucose, Bld 124 (*) 70 - 99 mg/dL   BUN 21  6 - 23 mg/dL   Creatinine, Ser 9.50  0.50 - 1.35 mg/dL   Calcium 8.5  8.4 - 64.6 mg/dL   Total Protein 6.1  6.0 - 8.3 g/dL   Albumin 2.3 (*) 3.5 - 5.2 g/dL   AST 29  0 - 37 U/L   ALT 38  0 - 53 U/L   Alkaline Phosphatase 145 (*) 39 - 117 U/L   Total Bilirubin 0.9  0.3 - 1.2 mg/dL   GFR calc non Af Amer 86 (*) >90 mL/min   GFR calc Af Amer >90  >90 mL/min   Comment: (NOTE)     The eGFR has been calculated using the CKD EPI equation.     This calculation has not been validated in all clinical situations.     eGFR's persistently <90 mL/min signify possible Chronic Kidney     Disease.  CBC     Status: Abnormal   Collection Time    11/15/13  6:00 AM      Result Value Ref Range   WBC 7.7  4.0 - 10.5 K/uL   RBC 2.90 (*) 4.22 - 5.81 MIL/uL   Hemoglobin 8.4 (*) 13.0 - 17.0 g/dL   HCT 28.8 (*) 05.5 - 98.6 %   MCV 90.3  78.0 - 100.0 fL   MCH 29.0  26.0 - 34.0 pg   MCHC 32.1  30.0 - 36.0 g/dL   RDW 09.0 (*) 16.9 - 82.9 %   Platelets 241  150 - 400 K/uL  PROTIME-INR     Status: None   Collection Time    11/15/13  6:00 AM      Result Value Ref Range   Prothrombin Time 14.6  11.6 - 15.2 seconds   INR 1.16  0.00 - 1.49  LACTATE DEHYDROGENASE     Status: Abnormal   Collection Time     11/15/13  6:00 AM      Result Value Ref Range   LDH 346 (*) 94 - 250 U/L  APTT     Status: None   Collection Time    11/15/13  6:00 AM      Result Value Ref Range   aPTT 35  24 - 37 seconds  GLUCOSE, CAPILLARY     Status: Abnormal   Collection Time    11/15/13  8:00 AM      Result Value  Ref Range   Glucose-Capillary 113 (*) 70 - 99 mg/dL  GLUCOSE, CAPILLARY     Status: Abnormal   Collection Time    11/15/13 11:29 AM      Result Value Ref Range   Glucose-Capillary 120 (*) 70 - 99 mg/dL   Comment 1 Notify RN     Comment 2 Documented in Chart    GLUCOSE, CAPILLARY     Status: Abnormal   Collection Time    11/15/13  4:49 PM      Result Value Ref Range   Glucose-Capillary 129 (*) 70 - 99 mg/dL   Comment 1 Notify RN     Comment 2 Documented in Chart    APTT     Status: None   Collection Time    11/15/13  6:15 PM      Result Value Ref Range   aPTT 35  24 - 37 seconds  GLUCOSE, CAPILLARY     Status: Abnormal   Collection Time    11/15/13  9:39 PM      Result Value Ref Range   Glucose-Capillary 112 (*) 70 - 99 mg/dL  GLUCOSE, CAPILLARY     Status: None   Collection Time    11/16/13 12:00 AM      Result Value Ref Range   Glucose-Capillary 99  70 - 99 mg/dL  COMPREHENSIVE METABOLIC PANEL     Status: Abnormal   Collection Time    11/16/13  5:10 AM      Result Value Ref Range   Sodium 138  137 - 147 mEq/L   Potassium 3.4 (*) 3.7 - 5.3 mEq/L   Chloride 100  96 - 112 mEq/L   CO2 25  19 - 32 mEq/L   Glucose, Bld 104 (*) 70 - 99 mg/dL   BUN 18  6 - 23 mg/dL   Creatinine, Ser 0.88  0.50 - 1.35 mg/dL   Calcium 8.4  8.4 - 10.5 mg/dL   Total Protein 6.5  6.0 - 8.3 g/dL   Albumin 2.4 (*) 3.5 - 5.2 g/dL   AST 24  0 - 37 U/L   ALT 32  0 - 53 U/L   Alkaline Phosphatase 151 (*) 39 - 117 U/L   Total Bilirubin 0.8  0.3 - 1.2 mg/dL   GFR calc non Af Amer 85 (*) >90 mL/min   GFR calc Af Amer >90  >90 mL/min   Comment: (NOTE)     The eGFR has been calculated using the CKD EPI  equation.     This calculation has not been validated in all clinical situations.     eGFR's persistently <90 mL/min signify possible Chronic Kidney     Disease.  CBC     Status: Abnormal   Collection Time    11/16/13  5:10 AM      Result Value Ref Range   WBC 7.6  4.0 - 10.5 K/uL   RBC 2.90 (*) 4.22 - 5.81 MIL/uL   Hemoglobin 8.5 (*) 13.0 - 17.0 g/dL   HCT 26.3 (*) 39.0 - 52.0 %   MCV 90.7  78.0 - 100.0 fL   MCH 29.3  26.0 - 34.0 pg   MCHC 32.3  30.0 - 36.0 g/dL   RDW 17.4 (*) 11.5 - 15.5 %   Platelets 272  150 - 400 K/uL  PROTIME-INR     Status: None   Collection Time    11/16/13  5:10 AM      Result Value Ref  Range   Prothrombin Time 14.3  11.6 - 15.2 seconds   INR 1.13  0.00 - 1.49  LACTATE DEHYDROGENASE     Status: Abnormal   Collection Time    11/16/13  5:10 AM      Result Value Ref Range   LDH 328 (*) 94 - 250 U/L  APTT     Status: Abnormal   Collection Time    11/16/13  5:10 AM      Result Value Ref Range   aPTT 62 (*) 24 - 37 seconds   Comment:            IF BASELINE aPTT IS ELEVATED,     SUGGEST PATIENT RISK ASSESSMENT     BE USED TO DETERMINE APPROPRIATE     ANTICOAGULANT THERAPY.  MAGNESIUM     Status: None   Collection Time    11/16/13  5:10 AM      Result Value Ref Range   Magnesium 2.1  1.5 - 2.5 mg/dL  PHOSPHORUS     Status: None   Collection Time    11/16/13  5:10 AM      Result Value Ref Range   Phosphorus 3.6  2.3 - 4.6 mg/dL  GLUCOSE, CAPILLARY     Status: Abnormal   Collection Time    11/16/13  5:28 AM      Result Value Ref Range   Glucose-Capillary 116 (*) 70 - 99 mg/dL   Dg Chest Port 1 View  11/15/2013   CLINICAL DATA:  Right apical pigtail catheter removal. Left ventricular assist device due to ischemic cardiomyopathy.  EXAM: PORTABLE CHEST - 1 VIEW  COMPARISON:  DG CHEST 1V PORT dated 11/15/2013; DG CHEST 1V PORT dated 11/14/2013  FINDINGS: The right-sided pigtail catheter has been removed. Stable airspace opacity above the minor fissure  and stable pleural thickening on the right. No pneumothorax.  Right IJ line tip:  SVC.  AICD noted.  The LVAD is partially seen.  Stable indistinct interstitial accentuation in the lung bases along with cardiomegaly.  IMPRESSION: 1. The right pleural drainage catheter has been removed but the radiographic appearance of the chest is otherwise stable. No pneumothorax or specific complicating feature related to the drainage catheter removal observed.   Electronically Signed   By: Sherryl Barters M.D.   On: 11/15/2013 11:54   Dg Chest Port 1 View  11/15/2013   CLINICAL DATA:  LVAD.  Chest tubes.  EXAM: PORTABLE CHEST - 1 VIEW  COMPARISON:  11/14/2013  FINDINGS: Left ventricular assist device remains in place. A left-sided dual lead ICD remains in place. Prior median sternotomy is noted. Right jugular central venous catheter is unchanged with tip overlying the lower SVC. Right pleural pigtail catheter projects over the right mid lung. The cardiac silhouette remains enlarged, unchanged. Small right pleural effusion does not appear significantly changed, including a more focal, possibly loculated component along the lateral aspect of the mid right hemithorax. No pneumothorax is identified. Mild parenchymal opacity in the right mid lung may have slightly increased.  IMPRESSION: 1. Unchanged position of support devices. 2. Unchanged right pleural effusion with pleural catheter in place. 3. Stable to slightly increased parenchymal opacity in the right mid to lower lung.   Electronically Signed   By: Logan Bores   On: 11/15/2013 09:07   Dg Chest Port 1 View  11/14/2013   CLINICAL DATA:  Follow-up hemothorax, chest tube, post LVAD placement  EXAM: PORTABLE CHEST - 1 VIEW  COMPARISON:  11/13/2013  FINDINGS: Patchy opacity in the right mid lung, corresponding to known hemothorax, improved. Indwelling pigtail drainage catheter. Small right pleural effusion with apical capping. No pneumothorax.  Cardiomegaly with pulmonary  vascular congestion. No frank interstitial edema.  LVAD. Left subclavian ICD. Right IJ venous catheter terminating at the cavoatrial junction.  IMPRESSION: Improving right hemothorax with indwelling pigtail drainage catheter.  Small right pleural effusion with apical capping.  No pneumothorax.   Electronically Signed   By: Julian Hy M.D.   On: 11/14/2013 08:00    Post Admission Physician Evaluation: 1. Functional deficits secondary  to deconditioning related to Hemothorax with subsequent VATS and multiple medical issues. 2. Patient is admitted to receive collaborative, interdisciplinary care between the physiatrist, rehab nursing staff, and therapy team. 3. Patient's level of medical complexity and substantial therapy needs in context of that medical necessity cannot be provided at a lesser intensity of care such as a SNF. 4. Patient has experienced substantial functional loss from his/her baseline which was documented above under the "Functional History" and "Functional Status" headings.  Judging by the patient's diagnosis, physical exam, and functional history, the patient has potential for functional progress which will result in measurable gains while on inpatient rehab.  These gains will be of substantial and practical use upon discharge  in facilitating mobility and self-care at the household level. 5. Physiatrist will provide 24 hour management of medical needs as well as oversight of the therapy plan/treatment and provide guidance as appropriate regarding the interaction of the two. 6. 24 hour rehab nursing will assist with bladder management, bowel management, safety, skin/wound care, disease management, medication administration, pain management and patient education  and help integrate therapy concepts, techniques,education, etc. 7. PT will assess and treat for/with: Lower extremity strength, range of motion, stamina, balance, functional mobility, safety, adaptive techniques and  equipment, cardio-pulmonary stamina, LVAD mgt, education.   Goals are: mod I. 8. OT will assess and treat for/with: ADL's, functional mobility, safety, upper extremity strength, adaptive techniques and equipment, CV stamina, LVAD mgt, education.   Goals are: mod I. 9. SLP will assess and treat for/with: n/a.  Goals are: n/a. 10. Case Management and Social Worker will assess and treat for psychological issues and discharge planning. 11. Team conference will be held weekly to assess progress toward goals and to determine barriers to discharge. 12. Patient will receive at least 3 hours of therapy per day at least 5 days per week. 13. ELOS: 7 days       14. Prognosis:  excellent   Medical Problem List and Plan: 1. deconditioning related to multi-medical, history of right status post VATS as well as LVAD January 2015 2. DVT Prophylaxis/Anticoagulation: Coumadin per pharmacy. Monitor for any bleeding episodes 3. Pain Management: Tylenol as needed 3. Mood/anxiety. Low-dose Xanax as needed. 5. Neuropsych: This patient is capable of making decisions on his own behalf. 6. History of LVAD January 2015. Followup per cardiology services. Pt is competent to help direct mgt of the device as well.  7. Chronic anemia. Latest hemoglobin 8.5. Continue iron supplement 8. Hypertension. lisinopril 2.5 mg daily, Aldactone 12.5 mg daily. Monitor with increased mobility 9. Diastolic congestive heart failure. Continue Lasix 40 mg daily. Monitor for any signs of fluid overload.  -check daily weights, follow I's and O's 10.[name] Hypothyroidism. Synthroid 11. Wound care: continue dressings to current sites, with regular inspection  Meredith Staggers, MD, [facility]  11/16/2013

## 2013-11-16 NOTE — Progress Notes (Signed)
Physical Therapy Treatment Patient Details Name: Johnny Oneill MRN: [id number] DOB: 19-May-1941 Today's Date: 11/16/2013 Time: [id number] PT Time Calculation (min): 23 min  PT Assessment / Plan / Recommendation  History of Present Illness Johnny Oneill is a 73 year old male with history of CAD s/p stent to LCX 2004 with re-occlusion of LCX with lateral MI 2009 and repeat stenting, total occlusion of RCA, 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 S/P L TKR 2013. Last Cath 2009..  [name] in Jan. 2014.  Now returns with vomiting weakness.  pt s/p VATS for hematoma evac and with postop respiratory complications and vent.  Extubation 3/15.   PT Comments   Noticeable improvement, but on RA pt became dyspneic with sats in the lower 80's and HR in the 100's.  PI dropped down from mid 6's to 5.9   Follow Up Recommendations  CIR (if the terms of admission are suitable)     Does the patient have the potential to tolerate intense rehabilitation     Barriers to Discharge        Equipment Recommendations  None recommended by PT    Recommendations for Other Services    Frequency Min 3X/week   Progress towards PT Goals Progress towards PT goals: Progressing toward goals  Plan Current plan remains appropriate    Precautions / Restrictions Precautions Precautions: Fall Restrictions Weight Bearing Restrictions: No Other Position/Activity Restrictions: Sternal precautions   Pertinent Vitals/Pain See above.     Mobility  Bed Mobility Overal bed mobility: Needs Assistance Bed Mobility: Supine to Sit Supine to sit: Min assist (rail) General bed mobility comments: minimal truncal assist due to sternal precautions Transfers Overall transfer level: Needs assistance Equipment used: Rolling walker (2 wheeled) Transfers: Sit to/from Stand Sit to Stand: Min assist;From elevated surface General  transfer comment: reinforced sternal precautions. Ambulation/Gait Ambulation/Gait assistance: Min assist Ambulation Distance (Feet): 200 Feet Assistive device: Rolling walker (2 wheeled) Gait Pattern/deviations: Step-through pattern;Drifts right/left Gait velocity: slow Gait velocity interpretation: Below normal speed for age/gender General Gait Details: mildly unsteady gait with list left, drift left unless stabilized.  Pt became moderately dyspneic and when checked the sats were 83% on RA,  HR in the 100's    Exercises     PT Diagnosis:    PT Problem List:   PT Treatment Interventions:     PT Goals (current goals can now be found in the care plan section) Acute Rehab PT Goals PT Goal Formulation: With patient Time For Goal Achievement: 11/20/13 Potential to Achieve Goals: Good  Visit Information  Last PT Received On: 11/16/13 Assistance Needed: +1 History of Present Illness: Johnny Oneill is a 73 year old male with history of CAD s/p stent to LCX 2004 with re-occlusion of LCX with lateral MI 2009 and repeat stenting, total occlusion of RCA, 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 S/P L TKR 2013. Last Cath 2009..  [name] in Jan. 2014.  Now returns with vomiting weakness.  pt s/p VATS for hematoma evac and with postop respiratory complications and vent.  Extubation 3/15.    Subjective Data  Subjective: I can't believe they think they can ask my wife to stay up on rehab 24/7 if they accept me.   Cognition  Cognition Arousal/Alertness: Awake/alert Behavior During Therapy: Agitated Overall Cognitive Status: Within Functional Limits  for tasks assessed Following Commands: Follows one step commands consistently    Balance  Balance Overall balance assessment: Needs assistance Sitting-balance support: No upper extremity supported;Feet supported Sitting balance-Leahy Scale:  Good Standing balance support: Bilateral upper extremity supported Standing balance-Leahy Scale: Fair Standing balance comment: tendency to list L  End of Session PT - End of Session Activity Tolerance: Patient tolerated treatment well;Patient limited by fatigue Patient left: in chair;with call bell/phone within reach Nurse Communication: Mobility status   GP     Kirin Brandenburger, Tessie Fass 11/16/2013, 5:59 PM 11/16/2013  Donnella Sham, [place] [phone]  (pager)

## 2013-11-16 NOTE — Progress Notes (Signed)
CARDIAC REHAB PHASE I   PRE:  Rate/Rhythm: 89 SR    BP: sitting 90    SaO2: 91 RA  MODE:  Ambulation: 100 ft   POST:  Rate/Rhythm: 104 ST    BP: sitting 90     SaO2: 94 RA  Pt ready to walk but wife had to apply an anchor for pts drive line before walk (not one present). Applied belt to hold system controller. Pt put batteries in his vest independently. Able to walk short distance and then return to recliner.  Will f/u. [id number]  Josephina Shih Hinckley CES, ACSM 11/16/2013 1:56 PM

## 2013-11-16 NOTE — Progress Notes (Addendum)
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 He is currently supported with a right IJ catheter. Femoral  A-line has been removed   Patient developed cardiac arrest from right hemo-thorax tension on right heart and was intubated with ventilator-dependent respiratory failure for 7 days. Now extubated, doing well The chest x-ray shows significant clearing after the procedures and drainage tubes were placed. His bronchial washings are negative and white count normal  The patient's LVAD function has been normal since evacuation of right hemothorax and he is maintained sinus rhythm. He is on heparin infusion while Coumadin is being resumed in until INR becomes therapeutic.INR today remains less than 1.5  The partial small bowel obstruction is resolved he currently has also resolved his postop ileus.  The patient currently is having BM's with normal KUB--advance to mechanical soft diet. Flat and upright of abdomen today show normal gas pattern.  The patient's pulmonary status has progressively improved after right VATS and subsequent pigtail catheter drainage and he was weaned and extubated . He is breathing comfortably with a strong cough. pigtail catheter was removed yesterday. Chest x-ray today shows no significant pleural effusion, mild pleural thickening on right  Patient did well overnight andrested much better. No problemswith abdominal bloating or discomfort, nausea.  The patient is been evaluated  for inpatient rehabilitation and appears to be an excellent candidate.  LVAD INTERROGATION HeartMate II LVAD:  Flow 4.2 liters/min, speed 9400 RPM, power 5.3, PI 5.5  1 PI event   Objective:    Vital Signs:   Temp:  [97.7 F (36.5 C)-97.8 F (36.6 C)] 97.7 F (36.5 C) (03/19 0500) Resp:  [18] 18 (03/19 0500) SpO2:  [94 %-99 %] 94 % (03/19 0830) Weight:  [159 lb 2.8 oz (72.2 kg)] 159 lb 2.8 oz (72.2 kg) (03/19 0719) Last BM Date: 11/15/13 Mean arterial Pressure  82-90 mm Hg Intake/Output:   Intake/Output Summary (Last 24 hours) at 11/16/13 1009 Last data filed at 11/15/13 1700  Gross per 24 hour  Intake    120 ml  Output    200 ml  Net    -80 ml     Physical Exam: General:  Well appearing. extubated and responsive, breathing comfortably  HEENT: normal Neck: supple. Mild JVD; no bruits. No lymphadenopathy or thryomegaly appreciated. Cor: Mechanical heart sounds with LVAD hum present. Lungs: clear, right VATS and Chest tube incisions clean and dry Abdomen: soft, nontender, mildly distended. No hepatosplenomegaly. No bruits or masses.  Drive line     dressing dry and intact, bowel sounds  normal. Driveline: C/D/I; securement device intact and driveline incorporated Extremities: no cyanosis, clubbing, rash, mild edema, extremities warm  Neuro: Intact moves all extremities but weak  Telemetry: sinus rhythm  Labs: Basic Metabolic Panel:  Recent Labs Lab 11/10/13 0425 11/11/13 0408 11/12/13 0330 11/13/13 0400 11/14/13 0511 11/15/13 0600 11/16/13 0510  NA 137 137 135* 134* 138 139 138  K 3.9 3.6* 3.7 4.1 3.9 3.7 3.4*  CL 99 100 99 98 100 100 100  CO2 26 26 26 23 28 26 25   GLUCOSE 143* 144*  158* 163* 125* 124* 104*  BUN 24* 20 21 23  24* 21 18  CREATININE 1.04 0.88 0.86 0.87 0.90 0.85 0.88  CALCIUM 8.0* 8.1* 8.2* 8.5 8.2* 8.5 8.4  MG 2.1 2.1  --  2.1  --   --  2.1  PHOS 2.5 2.5  --  3.0  --   --  3.6    Liver Function Tests:  Recent Labs Lab 11/12/13 0330  11/13/13 0400 11/14/13 0511 11/15/13 0600 11/16/13 0510  AST 30 31 26 29 24   ALT 53 50 35 38 32  ALKPHOS 147* 152* 124* 145* 151*  BILITOT 1.1 1.1 1.2 0.9 0.8  PROT 6.1 6.7 6.0 6.1 6.5  ALBUMIN 2.3* 2.6* 2.2* 2.3* 2.4*    Recent Labs Lab 11/09/13 1730  AMYLASE 24   No results found for this basename: AMMONIA,  in the last 168 hours  CBC:  Recent Labs Lab 11/12/13 0330 11/13/13 0400 11/14/13 0511 11/15/13 0600 11/16/13 0510  WBC 7.2 12.2* 9.2 7.7 7.6  HGB 8.9* 9.3* 8.9* 8.4* 8.5*  HCT 27.1* 28.7* 27.8* 26.2* 26.3*  MCV 88.6 89.1 90.3 90.3 90.7  PLT 171 224 250 241 272    INR:  Recent Labs Lab 11/12/13 0330 11/13/13 0400 11/14/13 0511 11/15/13 0600 11/16/13 0510  INR 1.16 1.22 1.23 1.16 1.13    Other results:  EKG:   Imaging: Dg Chest 2 View  11/16/2013   CLINICAL DATA:  Followup right hemothorax. Cardiac arrest. Left ventricular assist device.  EXAM: CHEST  2 VIEW  COMPARISON:  11/15/2013  FINDINGS: Mild bilateral pleural thickening remains stable. No evidence of pleural effusion. No evidence of pneumothorax.  Airspace opacity in the posterior right upper lobe is stable. No new or worsening areas of pulmonary opacity are seen. Cardiomegaly stable. Support apparatus remains in appropriate position.  IMPRESSION: Stable airspace opacity in posterior right upper lobe and bilateral pleural thickening.   Electronically Signed   By: Earle Gell M.D.   On: 11/16/2013 08:10   Dg Chest Port 1 View  11/15/2013   CLINICAL DATA:  Right apical pigtail catheter removal. Left ventricular assist device due to ischemic cardiomyopathy.  EXAM: PORTABLE CHEST - 1 VIEW  COMPARISON:  DG CHEST 1V PORT dated 11/15/2013; DG CHEST 1V PORT dated 11/14/2013  FINDINGS: The right-sided pigtail catheter has been removed. Stable airspace opacity above the minor fissure and stable pleural thickening on the right. No pneumothorax.  Right IJ line tip:  SVC.  AICD noted.  The LVAD is partially seen.   Stable indistinct interstitial accentuation in the lung bases along with cardiomegaly.  IMPRESSION: 1. The right pleural drainage catheter has been removed but the radiographic appearance of the chest is otherwise stable. No pneumothorax or specific complicating feature related to the drainage catheter removal observed.   Electronically Signed   By: Sherryl Barters M.D.   On: 11/15/2013 11:54   Dg Chest Port 1 View  11/15/2013   CLINICAL DATA:  LVAD.  Chest tubes.  EXAM: PORTABLE CHEST - 1 VIEW  COMPARISON:  11/14/2013  FINDINGS: Left ventricular assist device remains in place. A left-sided dual lead ICD remains in place. Prior median sternotomy is noted. Right jugular central venous catheter is unchanged with tip overlying the lower SVC. Right pleural pigtail catheter projects over the right mid lung. The cardiac silhouette remains enlarged, unchanged. Small right pleural effusion does not appear significantly changed, including a more focal, possibly loculated component along the lateral aspect of the mid right  hemithorax. No pneumothorax is identified. Mild parenchymal opacity in the right mid lung may have slightly increased.  IMPRESSION: 1. Unchanged position of support devices. 2. Unchanged right pleural effusion with pleural catheter in place. 3. Stable to slightly increased parenchymal opacity in the right mid to lower lung.   Electronically Signed   By: Logan Bores   On: 11/15/2013 09:07   Dg Abd 2 Views  11/16/2013   CLINICAL DATA:  Abdominal distention.  Followup ileus  EXAM: ABDOMEN - 2 VIEW  COMPARISON:  11/14/2013  FINDINGS: Previously seen mild dilatation of small bowel and gaseous distention of colon has resolved, consistent with resolving ileus. No evidence of radiopaque calculi. No evidence of free air.  IMPRESSION: Interval resolution of ileus pattern. Unremarkable bowel gas pattern.   Electronically Signed   By: Earle Gell M.D.   On: 11/16/2013 08:11     Medications:      Scheduled Medications: . antiseptic oral rinse  15 mL Mouth Rinse Q4H  . bisacodyl  10 mg Oral Once  . bisacodyl  10 mg Rectal Once  . budesonide (PULMICORT) nebulizer solution  0.25 mg Nebulization BID  . ferrous sulfate  325 mg Oral TID WC  . furosemide  40 mg Oral Daily  . insulin aspart  0-9 Units Subcutaneous TID WC  . levothyroxine  100 mcg Intravenous Daily  . lisinopril  2.5 mg Oral Daily  . metoCLOPramide (REGLAN) injection  10 mg Intravenous 4 times per day  . pantoprazole (PROTONIX) IV  40 mg Intravenous Daily  . potassium chloride  20 mEq Oral BID  . warfarin  5 mg Oral q1800  . Warfarin - Pharmacist Dosing Inpatient   Does not apply q1800  . Warfarin - Physician Dosing Inpatient   Does not apply q1800    Infusions: . sodium chloride 20 mL/hr at 11/14/13 1400  . Marland KitchenTPN (CLINIMIX-E) Adult 40 mL/hr at 11/15/13 1806   And  . fat emulsion 240 mL (11/15/13 1806)  . heparin 1,500 Units/hr (11/15/13 2205)    PRN Medications: diphenhydrAMINE, fentaNYL, LORazepam, ondansetron (ZOFRAN) IV, phenol, sodium chloride   Assessment:   Cont heparin until INR 2.0- starting po coumadin  Systemic anticoagulation with heparin, goal PTT 50-60  Continue systemic antibiotics complete 7-10 days for possible pneumonia  Continue PT, anticipate need for CIR, potential transfer the next 24 hours.  Wean off T. NA per pharmacy  Dry line dressing changes per protocol  Start peripheral IV and remove central line Plan/Discussion:      I reviewed the LVAD parameters from today, and compared the results to the patient's prior recorded data.  No programming changes were made.  The LVAD is functioning within specified parameters.  The patient performs LVAD self-test daily.  LVAD interrogation was negative for any significant power changes, alarms or PI events/speed drops.  LVAD equipment check completed and is in good working order.  Back-up equipment present.   LVAD education done on  emergency procedures and precautions and reviewed exit site care.  Length of Stay: 15  VAN TRIGT III,Colena Ketterman 11/16/2013, 10:09 AM  VAD Team Pager [phone] (7am - 7am)

## 2013-11-16 NOTE — Progress Notes (Signed)
PARENTERAL NUTRITION CONSULT NOTE: Follow Up Consult  Pharmacy Consult:  TPN Indication: Ileus  Allergies  Allergen Reactions  . Diovan [Valsartan] Other (See Comments)    Hypotension at low dose, hyperkalemia  . Lipitor [Atorvastatin Calcium] Other (See Comments)    Muscle pain    Patient Measurements: Height: 5\' 9"  (175.3 cm) Weight: 159 lb 2.8 oz (72.2 kg) (b) IBW/kg (Calculated) : 70.7 Usual Weight: 68 kg  Vital Signs: Temp: 97.7 F (36.5 C) (03/19 0500) Temp src: Oral (03/19 0500) Intake/Output from previous day: 03/18 0701 - 03/19 0700 In: 240 [P.O.:240] Out: 525 [Urine:525]  Labs:  Recent Labs  11/14/13 0511  11/15/13 0600 11/15/13 1815 11/16/13 0510  WBC 9.2  --  7.7  --  7.6  HGB 8.9*  --  8.4*  --  8.5*  HCT 27.8*  --  26.2*  --  26.3*  PLT 250  --  241  --  272  APTT 50*  < > 35 35 62*  INR 1.23  --  1.16  --  1.13  < > = values in this interval not displayed.   Recent Labs  11/14/13 0511 11/14/13 1020 11/14/13 1025 11/15/13 0600 11/16/13 0510  NA 138  --   --  139 138  K 3.9  --   --  3.7 3.4*  CL 100  --   --  100 100  CO2 28  --   --  26 25  GLUCOSE 125*  --   --  124* 104*  BUN 24*  --   --  21 18  CREATININE 0.90  --   --  0.85 0.88  CALCIUM 8.2*  --   --  8.5 8.4  MG  --   --   --   --  2.1  PHOS  --   --   --   --  3.6  PROT 6.0  --   --  6.1 6.5  ALBUMIN 2.2*  --   --  2.3* 2.4*  AST 26  --   --  29 24  ALT 35  --   --  38 32  ALKPHOS 124*  --   --  145* 151*  BILITOT 1.2  --   --  0.9 0.8  PREALBUMIN  --  11.7*  --   --   --   TRIG  --   --  93  --   --    Estimated Creatinine Clearance: 78.1 ml/min (by C-G formula based on Cr of 0.88).    Recent Labs  11/16/13 11/16/13 0528 11/16/13 0816  GLUCAP 99 116* 91   Insulin Requirements in the past 24 hours:  0 units sensitive SSI/24h with CBGs 91-124  Current Nutrition:  -Clinimix E 5/15 at 7ml/hr + lipid emulsion 20% at 39mL/hr provides 48g of protein and  1200kcal -dysphagia 3 diet- consumed 75% of dinner last evening and 100% of breakfast this morning  Nutritional Goals:  1850-2050 kcal and 105-115g protein per RD note 3/16  Assessment: 72 YOM with history of recent LVAD and discharged on 09/19/13.  Patient returned with N/V and CT scan revealed SBO.   GI: SBO was believed to have resolved (had flatus and several BMs)- TPN d/c'd 3/7. Resumed TPN 3/10 for inability to take po's/TFs and high NG output. KUB now normal, is having BMs and is not having issues with nausea or bloating. Prealbumin 8.7 on 3/11, increased a bit to 11.7 on 3/17. IV Reglan QID,  IV PPI. Advanced diet to dys3 yesterday. Per CVTS started to wean off TPN yesterday  Endo: hypothyroid on Synthroid (dose increased).  No hx DM - CBGs controlled on SSI  Lytes: Na 138, K 3.4- got 95mEq repletion PO and is now getting 2 KCl runs, Phos 3.6, Mag 2.1, CorCa 9.6  Renal: Scr 0.85, est CrCl ~12mL/min, UOP 0.69mL/kg/hr  Cards:  s/p LVAD 09/19/13 -Cardiac arrest 3/8 with PEA with R hemothorax. Cardiogenic/hemorrhagic shock. 3/9: right VATS and evacuation of hematoma-hemothorax. Now off Levophed and milrinone.  Hepatobil: Shock liver s/p PEA arrest 3/8. Now normalized with exception of alk phos still elevated at 151. TBili normal at 0.8. Albumin low at 2.4; trigs normal at 93  Heme: Hgb 9.2, Plts 224  Resp: VDRF. Has R-sided chest tube and large R pleural effusion concerning for hemorrhage (on 3/9 CT). Chest tube with 130 out in 24 hours- improved after TPA yesterday, likely to be repeated by IR today. FiO2 80% on partial rebreather  ID: antibiotics d/c'd. WBC 7.6. Afebrile.  Anticoag: LVAD. Coumadin PTA with INR 3.5 upon admit- reversed. Heparin drip for LVAD at 1458ml/hr per MD (goal ptt 50-60). INR 1.13 back on warfarin  Best Practices: PPI IV, MC, hep, warfarin  TPN Access: CVC placed 11/02/13  TPN day#: 3/5-3/8, resumed 3/10  Plan: 1. TPN and fat emulsion orders have been d/c'd  by Dr. Prescott Gum. Spoke with RN. Will decrease to 74mL/hr x1 hour and then stop 2. Discontinue TPN labs 3. Pharmacy to sign off. Please reconsult if needed.   Asiah Befort D. Julissa Browning, PharmD, BCPS Clinical Pharmacist Pager: [phone] 11/16/2013 10:23 AM

## 2013-11-16 NOTE — Progress Notes (Signed)
Advanced Heart Failure Team Rounding Note     Subjective:    Johnny Oneill is a 73 year old patient with a history of CAD and HF s/p LVAD HMII 09/19/2013 currently [facility] Transplant list .   Admitted to [facility] 09/13/13 through 10/03/13. Initially admitted with decompenstaed heart failure and low output. Required IABP for stabilization. He ultimately had LVAD HM II placed on 09/19/13. He suffered from RV failure after the procedure and required milrinone for >7 days which was gradually weaned off. D/C weight 151 pounds.   Admitted on 3/4 with SBO/ileus. Was improving then had cardiac arrest last night (3/8) with PEA in setting of R hemothorax. Now s/p R chest tube and multiple units transfusion. Underwent R VATs 3/9 then bronch on 3/10. On 3/13 underwent placement of R chest drain by IR.   Stable overnight. CT removed with no complications. Weight up 1 lb. On his way to radiology for CXR. Denies any SOB, orthopnea or CP. Working with CR and walked 150 ft yesterday. Evaluated for CIR yesterday and appears that he will be a candidate. INR 1.13, remains on heparin. Tolerating dysphagia III diet.  MAPs 90-92  LVAD INTERROGATION:  HeartMate II LVAD: Flow 4.6 liters/min, speed 9400, power 5.0 PI 5.3; 1 PI event   Physical Exam: GENERAL: Fatigued appearing; conversant HEENT: normal   NECK: Supple, JVP 7 CARDIAC: Mechanical heart sounds with LVAD hum present.  LUNGS: R chest tube. Decreased on R ABDOMEN: soft nontender,  Bowel sounds hypoactive LVAD exit site: well-healed and incorporated. Dressing dry and intact. No erythema or drainage. Stabilization device present and accurately applied. Driveline dressing is being changed daily per sterile technique.  EXTREMITIES: Warm and dry, no cyanosis, clubbing, rash or edema  NEUROLOGIC: awake non-focal.  Telemetry: SR   Labs: Basic Metabolic Panel:  Recent Labs Lab 11/10/13 0425 11/11/13 0408 11/12/13 0330 11/13/13 0400 11/14/13 0511 11/15/13 0600  11/16/13 0510  NA 137 137 135* 134* 138 139 138  K 3.9 3.6* 3.7 4.1 3.9 3.7 3.4*  CL 99 100 99 98 100 100 100  CO2 26 26 26 23 28 26 25   GLUCOSE 143* 144* 158* 163* 125* 124* 104*  BUN 24* 20 21 23  24* 21 18  CREATININE 1.04 0.88 0.86 0.87 0.90 0.85 0.88  CALCIUM 8.0* 8.1* 8.2* 8.5 8.2* 8.5 8.4  MG 2.1 2.1  --  2.1  --   --  2.1  PHOS 2.5 2.5  --  3.0  --   --  3.6    Liver Function Tests:  Recent Labs Lab 11/12/13 0330 11/13/13 0400 11/14/13 0511 11/15/13 0600 11/16/13 0510  AST 30 31 26 29 24   ALT 53 50 35 38 32  ALKPHOS 147* 152* 124* 145* 151*  BILITOT 1.1 1.1 1.2 0.9 0.8  PROT 6.1 6.7 6.0 6.1 6.5  ALBUMIN 2.3* 2.6* 2.2* 2.3* 2.4*    Recent Labs Lab 11/09/13 1730  AMYLASE 24   No results found for this basename: AMMONIA,  in the last 168 hours  CBC:  Recent Labs Lab 11/12/13 0330 11/13/13 0400 11/14/13 0511 11/15/13 0600 11/16/13 0510  WBC 7.2 12.2* 9.2 7.7 7.6  HGB 8.9* 9.3* 8.9* 8.4* 8.5*  HCT 27.1* 28.7* 27.8* 26.2* 26.3*  MCV 88.6 89.1 90.3 90.3 90.7  PLT 171 224 250 241 272    Cardiac Enzymes: No results found for this basename: CKTOTAL, CKMB, CKMBINDEX, TROPONINI,  in the last 168 hours  BNP: BNP (last 3 results)  Recent Labs  09/13/13 1349  09/25/13 0400 11/01/13 1033  PROBNP 3883.0* 7019.0* 877.6*    CBG:  Recent Labs Lab 11/15/13 1129 11/15/13 1649 11/15/13 2139 11/16/13 11/16/13 0528  GLUCAP 120* 129* 112* 99 116*    Coagulation Studies:  Recent Labs  11/14/13 0511 11/15/13 0600 11/16/13 0510  LABPROT 15.2 14.6 14.3  INR 1.23 1.16 1.13    Other results:   Imaging: Dg Chest Port 1 View  11/15/2013   CLINICAL DATA:  Right apical pigtail catheter removal. Left ventricular assist device due to ischemic cardiomyopathy.  EXAM: PORTABLE CHEST - 1 VIEW  COMPARISON:  DG CHEST 1V PORT dated 11/15/2013; DG CHEST 1V PORT dated 11/14/2013  FINDINGS: The right-sided pigtail catheter has been removed. Stable airspace opacity  above the minor fissure and stable pleural thickening on the right. No pneumothorax.  Right IJ line tip:  SVC.  AICD noted.  The LVAD is partially seen.  Stable indistinct interstitial accentuation in the lung bases along with cardiomegaly.  IMPRESSION: 1. The right pleural drainage catheter has been removed but the radiographic appearance of the chest is otherwise stable. No pneumothorax or specific complicating feature related to the drainage catheter removal observed.   Electronically Signed   By: Sherryl Barters M.D.   On: 11/15/2013 11:54   Dg Chest Port 1 View  11/15/2013   CLINICAL DATA:  LVAD.  Chest tubes.  EXAM: PORTABLE CHEST - 1 VIEW  COMPARISON:  11/14/2013  FINDINGS: Left ventricular assist device remains in place. A left-sided dual lead ICD remains in place. Prior median sternotomy is noted. Right jugular central venous catheter is unchanged with tip overlying the lower SVC. Right pleural pigtail catheter projects over the right mid lung. The cardiac silhouette remains enlarged, unchanged. Small right pleural effusion does not appear significantly changed, including a more focal, possibly loculated component along the lateral aspect of the mid right hemithorax. No pneumothorax is identified. Mild parenchymal opacity in the right mid lung may have slightly increased.  IMPRESSION: 1. Unchanged position of support devices. 2. Unchanged right pleural effusion with pleural catheter in place. 3. Stable to slightly increased parenchymal opacity in the right mid to lower lung.   Electronically Signed   By: Logan Bores   On: 11/15/2013 09:07   Dg Chest Port 1 View  11/14/2013   CLINICAL DATA:  Follow-up hemothorax, chest tube, post LVAD placement  EXAM: PORTABLE CHEST - 1 VIEW  COMPARISON:  11/13/2013  FINDINGS: Patchy opacity in the right mid lung, corresponding to known hemothorax, improved. Indwelling pigtail drainage catheter. Small right pleural effusion with apical capping. No pneumothorax.   Cardiomegaly with pulmonary vascular congestion. No frank interstitial edema.  LVAD. Left subclavian ICD. Right IJ venous catheter terminating at the cavoatrial junction.  IMPRESSION: Improving right hemothorax with indwelling pigtail drainage catheter.  Small right pleural effusion with apical capping.  No pneumothorax.   Electronically Signed   By: Julian Hy M.D.   On: 11/14/2013 08:00     Assessment:   1. Hemorrhagic shock due to R hemothorax    --s/p R chest tube 3/8 2. 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 CAD 4. Hypothyroidism 5. SBO 6. Acute blood loss anemia 7. PEA arrest 8. Shock liver 9. A/C renal failure, stage III -resolved 10. Ventilatory-dependent respiratory failure 11. Hypokalemia  Plan/Discussion:    Stable overnight. Continues on mechanical soft diet which he is tolerating, will not advance today still hypoactive BS. Will continue to wean TPN.   Volume status trending up  will increase lasix to 40 mg daily and supplement K+.   Appears that patient is a CIR candidate, appreciate their help with rehabilitating patient. From HF/VAD standpoint could transfer later today or tomorrow. He no longer needs to be on telemetry.   INR remains sub-therapeutic. Continue heparin and pharmacy dosing coumadin.   MAPs 90s will add Sprio 12.5 mg daily.   Hgb stable, no bleeding issues. Will continue to follow and will start iron supplements.   I reviewed the LVAD parameters from today, and compared the results to the patient's prior recorded data. No programming changes were made. The LVAD is functioning within specified parameters. The patient performs LVAD self-test daily. LVAD interrogation was negative for any significant power changes, alarms or PI events/speed drops. LVAD equipment check completed and is in good working order. Back-up equipment present.  Length of Stay: 15 Rande Brunt  11/16/2013, 6:55 AM  Advanced Heart Failure Team Pager [phone] (M-F; 7a - 4p)   Please contact [facility] for night-coverage after hours (4p -7a ) and weekends on [email or web address]  Patient seen and examined with Junie Bame, NP. We discussed all aspects of the encounter. I agree with the assessment and plan as stated above.   Continues to improve but still debilitated. Agree with increasing lasix. Tolerating diet - can advance as tolerated. Continue heparin/coumadin, appreciate pharmacy support. VAD parameters look good. Stable to transfer to CIR - appreciate their involvement.   Daniel Bensimhon,MD 7:30 AM

## 2013-11-16 NOTE — Progress Notes (Signed)
NUTRITION FOLLOW UP  INTERVENTION: Ensure Complete po daily, each supplement provides 350 kcal and 13 grams of protein RD to follow for nutrition care plan  NUTRITION DIAGNOSIS: Inadequate oral intake, improved  Goal: Pt to meet >/= 90% of their estimated nutrition needs, progressing  Monitor:  PO & supplemental intake, weight, labs, I/O's  ASSESSMENT: Patient is s/p LVAD on 09/19/2013; sent to the ED b/c of episodes of nausea vomiting and abdominal pain.   CT of abdomen/pelvis: dilated fluid and gas-filled stomach. Contrast extends into nondilated duodenum. Beyond this region, fluid and gas-filled dilated loops of small bowel consistent with SBO.   Patient s/p procedure 3/9: EMERGENT RIGHT CHEST TUBE PLACEMENT  Patient transferred to 2W-Cardiac from 2S-Surgical ICU 3/17.  Pt s/p bedside swallow evaluation 3/17.  Advanced to Dys 3, thin liquid diet 3/18.  PO intake variable at 50-75%.  PharmD note reviewed.  TPN to be discontinued today.  Chest tube removed 3/18.  Disposition: potential transfer to CIR.  RD feels pt still needs nutrition supplementation -- will order Ensure Complete daily.  Height: Ht Readings from Last 1 Encounters:  11/06/13 5\' 9"  (1.753 m)    Weight: -----> stable  Wt Readings from Last 1 Encounters:  11/16/13 159 lb 2.8 oz (72.2 kg)    3/15  157 lb 3/14  157 lb 3/13  158 lb 3/12  154 lb 3/11  160 lb 3/10  171 lb 3/09  151 lb 3/08  151 lb 3/07  156 lb 3/06  156 lb    BMI:  Body mass index is 23.49 kg/(m^2).  Re-estimated needs: Kcal: 1850-2050 Protein: 105-115 gm Fluid: per MD  Skin: Intact  Diet Order: Dys 3, thin liquids   Intake/Output Summary (Last 24 hours) at 11/16/13 1210 Last data filed at 11/15/13 1700  Gross per 24 hour  Intake    120 ml  Output    200 ml  Net    -80 ml    Labs:   Recent Labs Lab 11/11/13 0408  11/13/13 0400 11/14/13 0511 11/15/13 0600 11/16/13 0510  NA 137  < > 134* 138 139 138  K 3.6*  < >  4.1 3.9 3.7 3.4*  CL 100  < > 98 100 100 100  CO2 26  < > 23 28 26 25   BUN 20  < > 23 24* 21 18  CREATININE 0.88  < > 0.87 0.90 0.85 0.88  CALCIUM 8.1*  < > 8.5 8.2* 8.5 8.4  MG 2.1  --  2.1  --   --  2.1  PHOS 2.5  --  3.0  --   --  3.6  GLUCOSE 144*  < > 163* 125* 124* 104*  < > = values in this interval not displayed.  Scheduled Meds: . antiseptic oral rinse  15 mL Mouth Rinse Q4H  . bisacodyl  10 mg Oral Once  . bisacodyl  10 mg Rectal Once  . budesonide (PULMICORT) nebulizer solution  0.25 mg Nebulization BID  . ferrous sulfate  325 mg Oral TID WC  . furosemide  40 mg Oral Daily  . insulin aspart  0-9 Units Subcutaneous TID WC  . levothyroxine  100 mcg Intravenous Daily  . lisinopril  2.5 mg Oral Daily  . metoCLOPramide (REGLAN) injection  10 mg Intravenous 4 times per day  . pantoprazole (PROTONIX) IV  40 mg Intravenous Daily  . potassium chloride  10 mEq Intravenous Q1 Hr x 2  . warfarin  5 mg Oral  [id number]  . Warfarin - Pharmacist Dosing Inpatient   Does not apply q1800    Continuous Infusions: . sodium chloride 20 mL/hr at 11/14/13 1400  . heparin 1,500 Units/hr (11/15/13 2205)    Past Medical History  Diagnosis Date  . Ischemic cardiomyopathy     a. 10/09 Echo: Sev LV Dysfxn, inf/lat AK, Mod MR.  [name];  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 Hypertension   . PVC (premature ventricular contraction)   . Pacemaker   . CHF (congestive heart failure)   . Shortness of breath   . Automatic implantable cardioverter-defibrillator in situ     Past Surgical History  Procedure Laterality Date  . Defibrillator  2009  . Total  knee arthroplasty  11/27/11    left  . Insert / replace / remove pacemaker  2009  . Coronary angioplasty with stent placement  2004  . Coronary angioplasty with stent placement  07/2008  . Knee arthroscopy w/ partial medial meniscectomy  12/2002    left  . Knee arthroscopy      bilaterally  . Total knee arthroplasty  11/27/2011    Procedure: TOTAL KNEE ARTHROPLASTY;  Surgeon: Yvette Rack., MD;  Location: [place];  Service: Orthopedics;  Laterality: Left;  left total knee arthroplasty  . Esophagogastroduodenoscopy N/A 09/15/2013    Procedure: ESOPHAGOGASTRODUODENOSCOPY (EGD);  Surgeon: Ladene Artist, MD;  Location: [facility] ENDOSCOPY;  Service: Endoscopy;  Laterality: N/A;  bedside  . Insertion of implantable left ventricular assist device N/A 09/18/2013    Procedure: INSERTION OF IMPLANTABLE LEFT VENTRICULAR ASSIST DEVICE;  Surgeon: Ivin Poot, MD;  Location: [place];  Service: Open Heart Surgery;  Laterality: N/A;  CIRC ARREST  NITRIC OXIDE  . Intraoperative transesophageal echocardiogram N/A 09/18/2013    Procedure: INTRAOPERATIVE TRANSESOPHAGEAL ECHOCARDIOGRAM;  Surgeon: Ivin Poot, MD;  Location: [place];  Service: Open Heart Surgery;  Laterality: N/A;  . Video assisted thoracoscopy Right 11/06/2013    Procedure: VIDEO ASSISTED THORACOSCOPY;  Surgeon: Gaye Pollack, MD;  Location: [place];  Service: Cardiothoracic;  Laterality: Right;  . Hematoma evacuation Right 11/06/2013    Procedure: EVACUATION HEMATOMA;  Surgeon: Gaye Pollack, MD;  Location: [place];  Service: Cardiothoracic;  Laterality: Right;    Arthur Holms, RD, LDN Pager #: [phone] After-Hours Pager #: [phone]

## 2013-11-16 NOTE — Progress Notes (Signed)
Rehab admissions - Evaluated for possible admission.  I met with patient and gave him rehab brochures.  I explained to patient the need for a caregiver 24/7 if he comes to the rehab unit.  I gave him a copy of the contract to discuss with his wife.  I will check back with patient in am for plans.  Call me for questions.  #[phone]

## 2013-11-16 NOTE — Progress Notes (Signed)
Central line D/C'd per order. PIV inserted and lines switched. Pt informed of bedrest for 30 mins. Verbalized understanding. Will continue to monitor pt closely.

## 2013-11-16 NOTE — Progress Notes (Signed)
ANTICOAGULATION CONSULT NOTE - Follow-Up Consult  Pharmacy Consult for Coumadin/Heparin Indication: LVAD  Allergies  Allergen Reactions  . Diovan [Valsartan] Other (See Comments)    Hypotension at low dose, hyperkalemia  . Lipitor [Atorvastatin Calcium] Other (See Comments)    Muscle pain    Patient Measurements: Height: 5\' 9"  (175.3 cm) Weight: 159 lb 2.8 oz (72.2 kg) (b) IBW/kg (Calculated) : 70.7 Heparin Dosing Weight: n/a  Vital Signs: Temp: 97.7 F (36.5 C) (03/19 0500) Temp src: Oral (03/19 0500)  Labs:  Recent Labs  11/14/13 0511  11/15/13 0600 11/15/13 1815 11/16/13 0510  HGB 8.9*  --  8.4*  --  8.5*  HCT 27.8*  --  26.2*  --  26.3*  PLT 250  --  241  --  272  APTT 50*  < > 35 35 62*  LABPROT 15.2  --  14.6  --  14.3  INR 1.23  --  1.16  --  1.13  CREATININE 0.90  --  0.85  --  0.88  < > = values in this interval not displayed.  Estimated Creatinine Clearance: 78.1 ml/min (by C-G formula based on Cr of 0.88).   Medical History: Past Medical History  Diagnosis Date  . Ischemic cardiomyopathy     a. 10/09 Echo: Sev LV Dysfxn, inf/lat AK, Mod MR.  [name];  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 Hypertension   . PVC (premature ventricular contraction)   . Pacemaker   . CHF (congestive heart failure)   . Shortness of breath   . Automatic implantable cardioverter-defibrillator in situ     Medications:  Scheduled:  . antiseptic oral rinse  15 mL Mouth Rinse Q4H  . bisacodyl  10 mg Oral Once  . bisacodyl  10 mg Rectal Once  . budesonide (PULMICORT) nebulizer solution  0.25 mg Nebulization BID  . ferrous  sulfate  325 mg Oral TID WC  . furosemide  40 mg Oral Daily  . insulin aspart  0-9 Units Subcutaneous TID WC  . levothyroxine  100 mcg Intravenous Daily  . lisinopril  2.5 mg Oral Daily  . metoCLOPramide (REGLAN) injection  10 mg Intravenous 4 times per day  . pantoprazole (PROTONIX) IV  40 mg Intravenous Daily  . potassium chloride  10 mEq Intravenous Q1 Hr x 2  . warfarin  5 mg Oral q1800  . Warfarin - Pharmacist Dosing Inpatient   Does not apply q1800    Assessment: 73 yo male with hx CAD, and HF s/p LVAD HMII 09/19/13.  Admitted 3/4 with SBO/ileus, and Coumadin was held in setting of R hemothorax, s/p chest tube and multiple units transfusion.  Also had R chest drain placed on 3/13 removed 3/18.  Has remained on heparin IV infusion  with goal PTT 50-60.  Current drip rate 1500 uts/hr aPTT 62sec - this was peripheral stick per pt as heparin is running in through central line, CBC stable    Today's INR near normal at 1.13 not increasing despite Coumadin 5mg   doses.  Previous Coumadin dose 5mg  daily except 2.5mg  TTS.  This dose held his INR between 2-3 as outpatient but he was also on amiodarone.  Amiodarone was discontinued on this admission.   This increases Coumadin dose requirement.    Goal of Therapy:  INR 2-3 Monitor platelets by anticoagulation protocol: Yes   Plan:  1. Coumadin 7.5 mg po x 1 tonight. 2. Continue daily PT/INR. 3. Continue IV heparin to 1500 units/hr. 4. Recheck PTT, Protime with AM labs. 5. Will monitor for s/s bleeding, or any return of hemothorax.  Bonnita Nasuti Pharm.D. CPP, BCPS Clinical Pharmacist [phone] 11/16/2013 12:16 PM

## 2013-11-17 ENCOUNTER — Inpatient Hospital Stay (HOSPITAL_COMMUNITY)
Admission: RE | Admit: 2013-11-17 | Discharge: 2013-11-22 | DRG: 945 | Disposition: A | Discharge status: Home Health | Payer: Medicare Other | Source: Intra-hospital | Attending: Physical Medicine & Rehabilitation | Admitting: Physical Medicine & Rehabilitation

## 2013-11-17 DIAGNOSIS — J90 Pleural effusion, not elsewhere classified: Secondary | ICD-10-CM

## 2013-11-17 DIAGNOSIS — I5022 Chronic systolic (congestive) heart failure: Secondary | ICD-10-CM

## 2013-11-17 DIAGNOSIS — Z95810 Presence of automatic (implantable) cardiac defibrillator: Secondary | ICD-10-CM

## 2013-11-17 DIAGNOSIS — E7800 Pure hypercholesterolemia, unspecified: Secondary | ICD-10-CM

## 2013-11-17 DIAGNOSIS — I2589 Other forms of chronic ischemic heart disease: Secondary | ICD-10-CM

## 2013-11-17 DIAGNOSIS — Z9861 Coronary angioplasty status: Secondary | ICD-10-CM

## 2013-11-17 DIAGNOSIS — I5023 Acute on chronic systolic (congestive) heart failure: Secondary | ICD-10-CM

## 2013-11-17 DIAGNOSIS — Z823 Family history of stroke: Secondary | ICD-10-CM

## 2013-11-17 DIAGNOSIS — E039 Hypothyroidism, unspecified: Secondary | ICD-10-CM

## 2013-11-17 DIAGNOSIS — Z95818 Presence of other cardiac implants and grafts: Secondary | ICD-10-CM

## 2013-11-17 DIAGNOSIS — I252 Old myocardial infarction: Secondary | ICD-10-CM

## 2013-11-17 DIAGNOSIS — J942 Hemothorax: Secondary | ICD-10-CM

## 2013-11-17 DIAGNOSIS — F411 Generalized anxiety disorder: Secondary | ICD-10-CM

## 2013-11-17 DIAGNOSIS — Z7982 Long term (current) use of aspirin: Secondary | ICD-10-CM

## 2013-11-17 DIAGNOSIS — Z7682 Awaiting organ transplant status: Secondary | ICD-10-CM

## 2013-11-17 DIAGNOSIS — R5381 Other malaise: Secondary | ICD-10-CM | POA: Diagnosis present

## 2013-11-17 DIAGNOSIS — I2510 Atherosclerotic heart disease of native coronary artery without angina pectoris: Secondary | ICD-10-CM

## 2013-11-17 DIAGNOSIS — Z7901 Long term (current) use of anticoagulants: Secondary | ICD-10-CM

## 2013-11-17 DIAGNOSIS — Z79899 Other long term (current) drug therapy: Secondary | ICD-10-CM

## 2013-11-17 DIAGNOSIS — Z5189 Encounter for other specified aftercare: Principal | ICD-10-CM

## 2013-11-17 DIAGNOSIS — Z96659 Presence of unspecified artificial knee joint: Secondary | ICD-10-CM

## 2013-11-17 DIAGNOSIS — I509 Heart failure, unspecified: Secondary | ICD-10-CM

## 2013-11-17 DIAGNOSIS — Z8249 Family history of ischemic heart disease and other diseases of the circulatory system: Secondary | ICD-10-CM

## 2013-11-17 DIAGNOSIS — I5032 Chronic diastolic (congestive) heart failure: Secondary | ICD-10-CM

## 2013-11-17 DIAGNOSIS — D649 Anemia, unspecified: Secondary | ICD-10-CM

## 2013-11-17 DIAGNOSIS — Z95811 Presence of heart assist device: Secondary | ICD-10-CM

## 2013-11-17 DIAGNOSIS — Z9889 Other specified postprocedural states: Secondary | ICD-10-CM

## 2013-11-17 DIAGNOSIS — I10 Essential (primary) hypertension: Secondary | ICD-10-CM

## 2013-11-17 LAB — COMPREHENSIVE METABOLIC PANEL
ALT: 31 U/L (ref 0–53)
AST: 24 U/L (ref 0–37)
Albumin: 2.5 g/dL — ABNORMAL LOW (ref 3.5–5.2)
Alkaline Phosphatase: 146 U/L — ABNORMAL HIGH (ref 39–117)
BUN: 17 mg/dL (ref 6–23)
CO2: 24 mEq/L (ref 19–32)
Calcium: 8.3 mg/dL — ABNORMAL LOW (ref 8.4–10.5)
Chloride: 103 mEq/L (ref 96–112)
Creatinine, Ser: 0.99 mg/dL (ref 0.50–1.35)
GFR calc Af Amer: >90 mL/min (ref >90)
GFR calc non Af Amer: 81 mL/min — ABNORMAL LOW (ref >90)
Glucose, Bld: 88 mg/dL (ref 70–99)
Potassium: 3.8 mEq/L (ref 3.7–5.3)
Sodium: 140 mEq/L (ref 137–147)
Total Bilirubin: 0.8 mg/dL (ref 0.3–1.2)
Total Protein: 6.5 g/dL (ref 6.0–8.3)

## 2013-11-17 LAB — PROTIME-INR
INR: 1.34 (ref 0.00–1.49)
Prothrombin Time: 16.3 seconds — ABNORMAL HIGH (ref 11.6–15.2)

## 2013-11-17 LAB — APTT: aPTT: 68 seconds — ABNORMAL HIGH (ref 24–37)

## 2013-11-17 LAB — GLUCOSE, CAPILLARY: Glucose-Capillary: 88 mg/dL (ref 70–99)

## 2013-11-17 MED ORDER — BUDESONIDE 0.25 MG/2ML IN SUSP
0.2500 mg | Freq: Two times a day (BID) | RESPIRATORY_TRACT | Status: DC
  Administered 2013-11-17 – 2013-11-21 (×7): 0.25 mg via RESPIRATORY_TRACT
  Filled 2013-11-17 (×10): qty 2

## 2013-11-17 MED ORDER — LEVOTHYROXINE SODIUM 200 MCG PO TABS
200.0000 ug | ORAL_TABLET | Freq: Every day | ORAL | Status: DC
  Administered 2013-11-18 – 2013-11-22 (×5): 200 ug via ORAL
  Filled 2013-11-17 (×6): qty 1

## 2013-11-17 MED ORDER — ZOLPIDEM TARTRATE 5 MG PO TABS
5.0000 mg | ORAL_TABLET | Freq: Every evening | ORAL | Status: DC | PRN
  Administered 2013-11-17 – 2013-11-20 (×4): 5 mg via ORAL
  Filled 2013-11-17 (×4): qty 1

## 2013-11-17 MED ORDER — PANTOPRAZOLE SODIUM 40 MG PO TBEC
40.0000 mg | DELAYED_RELEASE_TABLET | Freq: Every day | ORAL | Status: DC
  Administered 2013-11-18 – 2013-11-22 (×5): 40 mg via ORAL
  Filled 2013-11-17 (×4): qty 1

## 2013-11-17 MED ORDER — ASPIRIN EC 325 MG PO TBEC
325.0000 mg | DELAYED_RELEASE_TABLET | Freq: Every day | ORAL | Status: DC
  Administered 2013-11-18 – 2013-11-22 (×5): 325 mg via ORAL
  Filled 2013-11-17 (×6): qty 1

## 2013-11-17 MED ORDER — BIOTENE DRY MOUTH MT LIQD
15.0000 mL | OROMUCOSAL | Status: DC
  Administered 2013-11-17 – 2013-11-20 (×8): 15 mL via OROMUCOSAL

## 2013-11-17 MED ORDER — SPIRONOLACTONE 12.5 MG HALF TABLET
12.5000 mg | ORAL_TABLET | Freq: Every day | ORAL | Status: DC
  Administered 2013-11-18 – 2013-11-22 (×5): 12.5 mg via ORAL
  Filled 2013-11-17 (×7): qty 1

## 2013-11-17 MED ORDER — WARFARIN SODIUM 7.5 MG PO TABS
7.5000 mg | ORAL_TABLET | Freq: Once | ORAL | Status: DC
  Filled 2013-11-17: qty 1

## 2013-11-17 MED ORDER — ASPIRIN EC 325 MG PO TBEC
325.0000 mg | DELAYED_RELEASE_TABLET | Freq: Every day | ORAL | Status: DC
  Administered 2013-11-17: 325 mg via ORAL
  Filled 2013-11-17: qty 1

## 2013-11-17 MED ORDER — ONDANSETRON HCL 4 MG PO TABS: 4.0000 mg | ORAL_TABLET | Freq: Four times a day (QID) | ORAL | Status: DC | PRN

## 2013-11-17 MED ORDER — SORBITOL 70 % SOLN: 30.0000 mL | Freq: Every day | Status: DC | PRN

## 2013-11-17 MED ORDER — METOCLOPRAMIDE HCL 10 MG PO TABS
10.0000 mg | ORAL_TABLET | Freq: Three times a day (TID) | ORAL | Status: AC
  Administered 2013-11-17 – 2013-11-19 (×8): 10 mg via ORAL
  Filled 2013-11-17 (×9): qty 1

## 2013-11-17 MED ORDER — METOCLOPRAMIDE HCL 10 MG PO TABS
10.0000 mg | ORAL_TABLET | Freq: Three times a day (TID) | ORAL | Status: DC
  Administered 2013-11-17: 10 mg via ORAL
  Filled 2013-11-17 (×4): qty 1

## 2013-11-17 MED ORDER — FUROSEMIDE 40 MG PO TABS
40.0000 mg | ORAL_TABLET | Freq: Every day | ORAL | Status: DC
  Administered 2013-11-18 – 2013-11-19 (×2): 40 mg via ORAL
  Filled 2013-11-17 (×4): qty 1

## 2013-11-17 MED ORDER — DIPHENHYDRAMINE HCL 25 MG PO CAPS: 25.0000 mg | ORAL_CAPSULE | Freq: Every evening | ORAL | Status: DC | PRN

## 2013-11-17 MED ORDER — WARFARIN SODIUM 7.5 MG PO TABS
7.5000 mg | ORAL_TABLET | Freq: Once | ORAL | Status: AC
  Administered 2013-11-17: 7.5 mg via ORAL
  Filled 2013-11-17: qty 1

## 2013-11-17 MED ORDER — WARFARIN - PHARMACIST DOSING INPATIENT
Freq: Every day | Status: DC
  Administered 2013-11-20 – 2013-11-21 (×2)

## 2013-11-17 MED ORDER — FERROUS SULFATE 325 (65 FE) MG PO TABS
325.0000 mg | ORAL_TABLET | Freq: Three times a day (TID) | ORAL | Status: DC
  Administered 2013-11-17 – 2013-11-22 (×15): 325 mg via ORAL
  Filled 2013-11-17 (×17): qty 1

## 2013-11-17 MED ORDER — LEVOTHYROXINE SODIUM 200 MCG PO TABS
200.0000 ug | ORAL_TABLET | Freq: Every day | ORAL | Status: DC
  Administered 2013-11-17: 200 ug via ORAL
  Filled 2013-11-17 (×2): qty 1

## 2013-11-17 MED ORDER — ONDANSETRON HCL 4 MG/2ML IJ SOLN
4.0000 mg | Freq: Four times a day (QID) | INTRAMUSCULAR | Status: DC | PRN
Start: 2013-11-17 — End: 2013-11-22

## 2013-11-17 MED ORDER — CITALOPRAM HYDROBROMIDE 10 MG PO TABS
10.0000 mg | ORAL_TABLET | Freq: Every day | ORAL | Status: DC
  Administered 2013-11-17: 10 mg via ORAL
  Filled 2013-11-17: qty 1

## 2013-11-17 MED ORDER — LISINOPRIL 2.5 MG PO TABS
2.5000 mg | ORAL_TABLET | Freq: Every day | ORAL | Status: DC
  Administered 2013-11-18 – 2013-11-19 (×2): 2.5 mg via ORAL
  Filled 2013-11-17 (×3): qty 1

## 2013-11-17 MED ORDER — HEPARIN (PORCINE) IN NACL 100-0.45 UNIT/ML-% IJ SOLN
1450.0000 [IU]/h | INTRAMUSCULAR | Status: DC
  Administered 2013-11-17: 1450 [IU]/h via INTRAVENOUS
  Filled 2013-11-17 (×2): qty 250

## 2013-11-17 MED ORDER — METOCLOPRAMIDE HCL 10 MG PO TABS
10.0000 mg | ORAL_TABLET | Freq: Three times a day (TID) | ORAL | Status: DC
  Filled 2013-11-17 (×5): qty 1

## 2013-11-17 MED ORDER — PANTOPRAZOLE SODIUM 40 MG PO TBEC
40.0000 mg | DELAYED_RELEASE_TABLET | Freq: Every day | ORAL | Status: DC
  Administered 2013-11-17: 40 mg via ORAL
  Filled 2013-11-17: qty 1

## 2013-11-17 MED ORDER — POTASSIUM CHLORIDE CRYS ER 20 MEQ PO TBCR
20.0000 meq | EXTENDED_RELEASE_TABLET | Freq: Once | ORAL | Status: AC
  Administered 2013-11-17: 20 meq via ORAL
  Filled 2013-11-17: qty 1

## 2013-11-17 MED ORDER — CITALOPRAM HYDROBROMIDE 10 MG PO TABS
10.0000 mg | ORAL_TABLET | Freq: Every day | ORAL | Status: DC
  Administered 2013-11-18 – 2013-11-22 (×5): 10 mg via ORAL
  Filled 2013-11-17 (×8): qty 1

## 2013-11-17 NOTE — H&P (View-Only) (Signed)
Physical Medicine and Rehabilitation Admission H&P    Chief Complaint  Patient presents with  . Emesis  :  Chief complaint: Weakness  HPI: Johnny Oneill is a 73 y.o. right-handed male with complicated medical history of CAD status post stent, chronic Coumadin, chronic systolic heart failure, status post LVAD 09/19/2013 currently [facility] transplant list. Presented 11/01/2013 after followup in the heart failure clinic with syncopal episode as well as vomiting and lethargy. CT of the abdomen consistent with small bowel obstruction. A nasogastric tube was placed and followup per Gen. surgery. TPN was initiated for nutritional support. Hospital course complicated by cardiac arrest 11/05/2013 with PEA in the setting of right hemothorax with right chest tube placed 11/06/2013 as well as VATS procedure and subsequent interventional radiology pigtail drainage of loculated right apical hematoma and chest tube ultimately removed 11/15/2013. Patient small bowel obstruction ileus continues to improve with diet advanced to regular consistency. Patient did require intubation until 11/12/2013. Therapies have been initiated with progressive gains ambulating 120 feet pushing his wheelchair but fatigues greatly. Pt screened by the rehab team and ultimately admitted today.   ROS Review of Systems  Respiratory: Positive for shortness of breath.  Cardiovascular: Positive for leg swelling.  Gastrointestinal: Positive for nausea and vomiting.  Musculoskeletal: Positive for myalgias.  Neurological:  Syncope  Psychiatric/Behavioral:  Anxiety  All other systems reviewed and are negative  Past Medical History  Diagnosis Date  . Ischemic cardiomyopathy     a. 10/09 Echo: Sev LV Dysfxn, inf/lat AK, Mod MR.  [name];  s/p Lat MI 2009 with occlusion of the LCX - treated  with Promus stenting. Has total occlusion of the RCA.   . ICD (implantable cardiac defibrillator) in place     PROPHYLACTIC      medtronic  . RBBB   . 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 Hypertension   . PVC (premature ventricular contraction)   . Pacemaker   . CHF (congestive heart failure)   . Shortness of breath   . Automatic implantable cardioverter-defibrillator in situ    Past Surgical History  Procedure Laterality Date  . Defibrillator  2009  . Total knee arthroplasty  11/27/11    left  . Insert / replace / remove pacemaker  2009  . Coronary angioplasty with stent placement  2004  . Coronary angioplasty with stent placement  07/2008  . Knee arthroscopy w/ partial medial meniscectomy  12/2002    left  . Knee arthroscopy      bilaterally  . Total knee arthroplasty  11/27/2011    Procedure: TOTAL KNEE ARTHROPLASTY;  Surgeon: Yvette Rack., MD;  Location: [place];  Service: Orthopedics;  Laterality: Left;  left total knee arthroplasty  . Esophagogastroduodenoscopy N/A 09/15/2013    Procedure: ESOPHAGOGASTRODUODENOSCOPY (EGD);  Surgeon: Ladene Artist, MD;  Location: [facility] ENDOSCOPY;  Service: Endoscopy;  Laterality: N/A;  bedside  . Insertion of implantable left ventricular assist device N/A 09/18/2013    Procedure: INSERTION OF IMPLANTABLE LEFT VENTRICULAR ASSIST DEVICE;  Surgeon: Ivin Poot, MD;  Location: [place];  Service: Open Heart Surgery;  Laterality: N/A;  CIRC ARREST  NITRIC OXIDE  . Intraoperative transesophageal echocardiogram N/A 09/18/2013    Procedure: INTRAOPERATIVE TRANSESOPHAGEAL ECHOCARDIOGRAM;  Surgeon: Ivin Poot, MD;  Location: [facility] OR;  Service: Open Heart Surgery;  Laterality: N/A;  . Video assisted thoracoscopy Right 11/06/2013    Procedure: VIDEO ASSISTED THORACOSCOPY;  Surgeon: Gaye Pollack, MD;  Location: [place];  Service: Cardiothoracic;  Laterality: Right;  . Hematoma evacuation Right 11/06/2013    Procedure: EVACUATION HEMATOMA;  Surgeon: Gaye Pollack,  MD;  Location: [place];  Service: Cardiothoracic;  Laterality: Right;   Family History  Problem Relation Age of Onset  . Heart failure Father   . Stroke Father   . Coronary artery disease Mother   . Heart disease Mother   . Hyperlipidemia Sister    Social History:  reports that he has never smoked. He has never used smokeless tobacco. He reports that he drinks alcohol. He reports that he does not use illicit drugs. Allergies:  Allergies  Allergen Reactions  . Diovan [Valsartan] Other (See Comments)    Hypotension at low dose, hyperkalemia  . Lipitor [Atorvastatin Calcium] Other (See Comments)    Muscle pain   Medications Prior to Admission  Medication Sig Dispense Refill  . amiodarone (PACERONE) 200 MG tablet Take 1 tablet (200 mg total) by mouth 2 (two) times daily.  60 tablet  6  . amLODipine (NORVASC) 10 MG tablet Take 1 tablet (10 mg total) by mouth daily.  30 tablet  3  . aspirin 325 MG tablet Take 325 mg by mouth daily.        . cephALEXin (KEFLEX) 500 MG capsule Take 500 mg by mouth 2 (two) times daily. 5 day course filled 11/01/12      . Coenzyme Q10 (COQ10) 200 MG CAPS Take 200 mg by mouth daily.      [name] ezetimibe (ZETIA) 10 MG tablet Take 10 mg by mouth daily.      . ferrous [id number] C-folic acid (TRINSICON / FOLTRIN) capsule Take 1 capsule by mouth every morning.  30 capsule  3  . ibuprofen (ADVIL,MOTRIN) 200 MG tablet Take 200 mg by mouth daily as needed (pain).      [name] levothyroxine (SYNTHROID, LEVOTHROID) 150 MCG tablet Take 75-150 mcg by mouth daily before breakfast. Take 1 tablet (150 mcg) daily on an empty stomach EXCEPT  take 1/2 tablet (75 mcg) every Friday      . nitroGLYCERIN (NITROSTAT) 0.4 MG SL tablet Place 0.4 mg under the tongue every 5 (five) minutes as needed for chest pain.       . Omega-3 300 MG CAPS Take 300 mg by mouth at bedtime.       . pantoprazole (PROTONIX) 40 MG tablet Take 40 mg by mouth daily.      . rosuvastatin (CRESTOR) 20 MG tablet  Take 20 mg by mouth daily.      [name] warfarin (COUMADIN) 5 MG tablet Take 5 mg by mouth daily at 6 PM. Take 1/2 tablet (2.5 mg) on Tues, Thurs, Saturday, take 1 tablet (5 mg) on Sunday, Monday, Wednesday, Friday      . clindamycin (CLEOCIN) 150 MG capsule Take 600 mg by mouth once.         Home: Home Living Family/patient expects to be discharged to:: Private residence Living Arrangements: Spouse/significant other Available Help at Discharge: Family Type of Home: House Home Access: Stairs to enter [employer] of Steps: 2 Entrance Stairs-Rails: None Home Layout: Two level;Able to live on main level with bedroom/bathroom Alternate Level Stairs-Number of Steps: flight Alternate Level Stairs-Rails: Left;Right Home Equipment: Walker - 2 wheels;Tub bench;Shower seat   Functional  History: Prior Function Comments: driving, out and about  Functional Status:  Mobility: min assist for mobility and transfers     Ambulation/Gait Ambulation Distance (Feet): 120 Feet Gait velocity: slow General Gait Details: weak and guarded gait    ADL:    Cognition: Cognition Overall Cognitive Status: Within Functional Limits for tasks assessed Orientation Level: Oriented X4 Cognition Arousal/Alertness: Awake/alert Behavior During Therapy: Flat affect Overall Cognitive Status: Within Functional Limits for tasks assessed  Physical Exam: Blood pressure 94/0, pulse 86, temperature 97.7 F (36.5 C), temperature source Oral, resp. rate 18, height $RemoveBe'5\' 9"'[id number]$  (1.753 m), weight 71.8 kg (158 lb 4.6 oz), SpO2 96.00%. Physical Exam Constitutional: He is oriented to person, place, and time. He appears well-developed.  73 year old male. Better color today  HENT:  Head: Normocephalic and atraumatic.  Eyes: EOM are normal.  Neck: Normal range of motion. Neck supple. No JVD present. No tracheal deviation present. No thyromegaly present.  Cardiovascular:  Hum of LVAD auscultated  Respiratory: Breath  sounds normal. No respiratory distress. Slight decrease in sounds on the right.  GI: Soft. Bowel sounds are normal. He exhibits no distension.  Lymphadenopathy:  He has no cervical adenopathy.  Neurological: He is alert and oriented to person, place, and time.  Motor 5/5 UE prox to distal, LE 4- HF, 4 KE, ankles 4+.  Fatigues quickly. No gross sensory findings. CN exam intact. Good insight and awareness. Normal memory. DTR's 1+ Skin:   chest tube sites clean and dry, dressed with minimal drainage. Psychiatric: He has a normal mood and affect. His behavior is normal. Judgment and thought content normal  Results for orders placed during the hospital encounter of 11/01/13 (from the past 48 hour(s))  GLUCOSE, CAPILLARY     Status: Abnormal   Collection Time    11/14/13  8:19 AM      Result Value Ref Range   Glucose-Capillary 121 (*) 70 - 99 mg/dL   Comment 1 Documented in Chart     Comment 2 Notify RN    PREALBUMIN     Status: Abnormal   Collection Time    11/14/13 10:20 AM      Result Value Ref Range   Prealbumin 11.7 (*) 17.0 - 34.0 mg/dL   Comment: Performed at [facility]     Status: None   Collection Time    11/14/13 10:25 AM      Result Value Ref Range   Triglycerides 93  <150 mg/dL  GLUCOSE, CAPILLARY     Status: Abnormal   Collection Time    11/14/13  1:22 PM      Result Value Ref Range   Glucose-Capillary 135 (*) 70 - 99 mg/dL  APTT     Status: Abnormal   Collection Time    11/14/13  6:20 PM      Result Value Ref Range   aPTT 54 (*) 24 - 37 seconds   Comment:            IF BASELINE aPTT IS ELEVATED,     SUGGEST PATIENT RISK ASSESSMENT     BE USED TO DETERMINE APPROPRIATE     ANTICOAGULANT THERAPY.  GLUCOSE, CAPILLARY     Status: Abnormal   Collection Time    11/14/13  6:24 PM      Result Value Ref Range   Glucose-Capillary 118 (*) 70 - 99 mg/dL   Comment 1 Notify RN     Comment 2 Documented in Chart    GLUCOSE,  CAPILLARY     Status:  Abnormal   Collection Time    11/14/13 10:13 PM      Result Value Ref Range   Glucose-Capillary 134 (*) 70 - 99 mg/dL  GLUCOSE, CAPILLARY     Status: Abnormal   Collection Time    11/15/13 12:13 AM      Result Value Ref Range   Glucose-Capillary 116 (*) 70 - 99 mg/dL  GLUCOSE, CAPILLARY     Status: Abnormal   Collection Time    11/15/13  4:20 AM      Result Value Ref Range   Glucose-Capillary 122 (*) 70 - 99 mg/dL  COMPREHENSIVE METABOLIC PANEL     Status: Abnormal   Collection Time    11/15/13  6:00 AM      Result Value Ref Range   Sodium 139  137 - 147 mEq/L   Potassium 3.7  3.7 - 5.3 mEq/L   Chloride 100  96 - 112 mEq/L   CO2 26  19 - 32 mEq/L   Glucose, Bld 124 (*) 70 - 99 mg/dL   BUN 21  6 - 23 mg/dL   Creatinine, Ser 0.85  0.50 - 1.35 mg/dL   Calcium 8.5  8.4 - 10.5 mg/dL   Total Protein 6.1  6.0 - 8.3 g/dL   Albumin 2.3 (*) 3.5 - 5.2 g/dL   AST 29  0 - 37 U/L   ALT 38  0 - 53 U/L   Alkaline Phosphatase 145 (*) 39 - 117 U/L   Total Bilirubin 0.9  0.3 - 1.2 mg/dL   GFR calc non Af Amer 86 (*) >90 mL/min   GFR calc Af Amer >90  >90 mL/min   Comment: (NOTE)     The eGFR has been calculated using the CKD EPI equation.     This calculation has not been validated in all clinical situations.     eGFR's persistently <90 mL/min signify possible Chronic Kidney     Disease.  CBC     Status: Abnormal   Collection Time    11/15/13  6:00 AM      Result Value Ref Range   WBC 7.7  4.0 - 10.5 K/uL   RBC 2.90 (*) 4.22 - 5.81 MIL/uL   Hemoglobin 8.4 (*) 13.0 - 17.0 g/dL   HCT 26.2 (*) 39.0 - 52.0 %   MCV 90.3  78.0 - 100.0 fL   MCH 29.0  26.0 - 34.0 pg   MCHC 32.1  30.0 - 36.0 g/dL   RDW 17.2 (*) 11.5 - 15.5 %   Platelets 241  150 - 400 K/uL  PROTIME-INR     Status: None   Collection Time    11/15/13  6:00 AM      Result Value Ref Range   Prothrombin Time 14.6  11.6 - 15.2 seconds   INR 1.16  0.00 - 1.49  LACTATE DEHYDROGENASE     Status: Abnormal   Collection Time     11/15/13  6:00 AM      Result Value Ref Range   LDH 346 (*) 94 - 250 U/L  APTT     Status: None   Collection Time    11/15/13  6:00 AM      Result Value Ref Range   aPTT 35  24 - 37 seconds  GLUCOSE, CAPILLARY     Status: Abnormal   Collection Time    11/15/13  8:00 AM      Result Value  Ref Range   Glucose-Capillary 113 (*) 70 - 99 mg/dL  GLUCOSE, CAPILLARY     Status: Abnormal   Collection Time    11/15/13 11:29 AM      Result Value Ref Range   Glucose-Capillary 120 (*) 70 - 99 mg/dL   Comment 1 Notify RN     Comment 2 Documented in Chart    GLUCOSE, CAPILLARY     Status: Abnormal   Collection Time    11/15/13  4:49 PM      Result Value Ref Range   Glucose-Capillary 129 (*) 70 - 99 mg/dL   Comment 1 Notify RN     Comment 2 Documented in Chart    APTT     Status: None   Collection Time    11/15/13  6:15 PM      Result Value Ref Range   aPTT 35  24 - 37 seconds  GLUCOSE, CAPILLARY     Status: Abnormal   Collection Time    11/15/13  9:39 PM      Result Value Ref Range   Glucose-Capillary 112 (*) 70 - 99 mg/dL  GLUCOSE, CAPILLARY     Status: None   Collection Time    11/16/13 12:00 AM      Result Value Ref Range   Glucose-Capillary 99  70 - 99 mg/dL  COMPREHENSIVE METABOLIC PANEL     Status: Abnormal   Collection Time    11/16/13  5:10 AM      Result Value Ref Range   Sodium 138  137 - 147 mEq/L   Potassium 3.4 (*) 3.7 - 5.3 mEq/L   Chloride 100  96 - 112 mEq/L   CO2 25  19 - 32 mEq/L   Glucose, Bld 104 (*) 70 - 99 mg/dL   BUN 18  6 - 23 mg/dL   Creatinine, Ser 0.88  0.50 - 1.35 mg/dL   Calcium 8.4  8.4 - 10.5 mg/dL   Total Protein 6.5  6.0 - 8.3 g/dL   Albumin 2.4 (*) 3.5 - 5.2 g/dL   AST 24  0 - 37 U/L   ALT 32  0 - 53 U/L   Alkaline Phosphatase 151 (*) 39 - 117 U/L   Total Bilirubin 0.8  0.3 - 1.2 mg/dL   GFR calc non Af Amer 85 (*) >90 mL/min   GFR calc Af Amer >90  >90 mL/min   Comment: (NOTE)     The eGFR has been calculated using the CKD EPI  equation.     This calculation has not been validated in all clinical situations.     eGFR's persistently <90 mL/min signify possible Chronic Kidney     Disease.  CBC     Status: Abnormal   Collection Time    11/16/13  5:10 AM      Result Value Ref Range   WBC 7.6  4.0 - 10.5 K/uL   RBC 2.90 (*) 4.22 - 5.81 MIL/uL   Hemoglobin 8.5 (*) 13.0 - 17.0 g/dL   HCT 26.3 (*) 39.0 - 52.0 %   MCV 90.7  78.0 - 100.0 fL   MCH 29.3  26.0 - 34.0 pg   MCHC 32.3  30.0 - 36.0 g/dL   RDW 17.4 (*) 11.5 - 15.5 %   Platelets 272  150 - 400 K/uL  PROTIME-INR     Status: None   Collection Time    11/16/13  5:10 AM      Result Value Ref  Range   Prothrombin Time 14.3  11.6 - 15.2 seconds   INR 1.13  0.00 - 1.49  LACTATE DEHYDROGENASE     Status: Abnormal   Collection Time    11/16/13  5:10 AM      Result Value Ref Range   LDH 328 (*) 94 - 250 U/L  APTT     Status: Abnormal   Collection Time    11/16/13  5:10 AM      Result Value Ref Range   aPTT 62 (*) 24 - 37 seconds   Comment:            IF BASELINE aPTT IS ELEVATED,     SUGGEST PATIENT RISK ASSESSMENT     BE USED TO DETERMINE APPROPRIATE     ANTICOAGULANT THERAPY.  MAGNESIUM     Status: None   Collection Time    11/16/13  5:10 AM      Result Value Ref Range   Magnesium 2.1  1.5 - 2.5 mg/dL  PHOSPHORUS     Status: None   Collection Time    11/16/13  5:10 AM      Result Value Ref Range   Phosphorus 3.6  2.3 - 4.6 mg/dL  GLUCOSE, CAPILLARY     Status: Abnormal   Collection Time    11/16/13  5:28 AM      Result Value Ref Range   Glucose-Capillary 116 (*) 70 - 99 mg/dL   Dg Chest Port 1 View  11/15/2013   CLINICAL DATA:  Right apical pigtail catheter removal. Left ventricular assist device due to ischemic cardiomyopathy.  EXAM: PORTABLE CHEST - 1 VIEW  COMPARISON:  DG CHEST 1V PORT dated 11/15/2013; DG CHEST 1V PORT dated 11/14/2013  FINDINGS: The right-sided pigtail catheter has been removed. Stable airspace opacity above the minor fissure  and stable pleural thickening on the right. No pneumothorax.  Right IJ line tip:  SVC.  AICD noted.  The LVAD is partially seen.  Stable indistinct interstitial accentuation in the lung bases along with cardiomegaly.  IMPRESSION: 1. The right pleural drainage catheter has been removed but the radiographic appearance of the chest is otherwise stable. No pneumothorax or specific complicating feature related to the drainage catheter removal observed.   Electronically Signed   By: Sherryl Barters M.D.   On: 11/15/2013 11:54   Dg Chest Port 1 View  11/15/2013   CLINICAL DATA:  LVAD.  Chest tubes.  EXAM: PORTABLE CHEST - 1 VIEW  COMPARISON:  11/14/2013  FINDINGS: Left ventricular assist device remains in place. A left-sided dual lead ICD remains in place. Prior median sternotomy is noted. Right jugular central venous catheter is unchanged with tip overlying the lower SVC. Right pleural pigtail catheter projects over the right mid lung. The cardiac silhouette remains enlarged, unchanged. Small right pleural effusion does not appear significantly changed, including a more focal, possibly loculated component along the lateral aspect of the mid right hemithorax. No pneumothorax is identified. Mild parenchymal opacity in the right mid lung may have slightly increased.  IMPRESSION: 1. Unchanged position of support devices. 2. Unchanged right pleural effusion with pleural catheter in place. 3. Stable to slightly increased parenchymal opacity in the right mid to lower lung.   Electronically Signed   By: Logan Bores   On: 11/15/2013 09:07   Dg Chest Port 1 View  11/14/2013   CLINICAL DATA:  Follow-up hemothorax, chest tube, post LVAD placement  EXAM: PORTABLE CHEST - 1 VIEW  COMPARISON:  11/13/2013  FINDINGS: Patchy opacity in the right mid lung, corresponding to known hemothorax, improved. Indwelling pigtail drainage catheter. Small right pleural effusion with apical capping. No pneumothorax.  Cardiomegaly with pulmonary  vascular congestion. No frank interstitial edema.  LVAD. Left subclavian ICD. Right IJ venous catheter terminating at the cavoatrial junction.  IMPRESSION: Improving right hemothorax with indwelling pigtail drainage catheter.  Small right pleural effusion with apical capping.  No pneumothorax.   Electronically Signed   By: Julian Hy M.D.   On: 11/14/2013 08:00    Post Admission Physician Evaluation: 1. Functional deficits secondary  to deconditioning related to Hemothorax with subsequent VATS and multiple medical issues. 2. Patient is admitted to receive collaborative, interdisciplinary care between the physiatrist, rehab nursing staff, and therapy team. 3. Patient's level of medical complexity and substantial therapy needs in context of that medical necessity cannot be provided at a lesser intensity of care such as a SNF. 4. Patient has experienced substantial functional loss from his/her baseline which was documented above under the "Functional History" and "Functional Status" headings.  Judging by the patient's diagnosis, physical exam, and functional history, the patient has potential for functional progress which will result in measurable gains while on inpatient rehab.  These gains will be of substantial and practical use upon discharge  in facilitating mobility and self-care at the household level. 5. Physiatrist will provide 24 hour management of medical needs as well as oversight of the therapy plan/treatment and provide guidance as appropriate regarding the interaction of the two. 6. 24 hour rehab nursing will assist with bladder management, bowel management, safety, skin/wound care, disease management, medication administration, pain management and patient education  and help integrate therapy concepts, techniques,education, etc. 7. PT will assess and treat for/with: Lower extremity strength, range of motion, stamina, balance, functional mobility, safety, adaptive techniques and  equipment, cardio-pulmonary stamina, LVAD mgt, education.   Goals are: mod I. 8. OT will assess and treat for/with: ADL's, functional mobility, safety, upper extremity strength, adaptive techniques and equipment, CV stamina, LVAD mgt, education.   Goals are: mod I. 9. SLP will assess and treat for/with: n/a.  Goals are: n/a. 10. Case Management and Social Worker will assess and treat for psychological issues and discharge planning. 11. Team conference will be held weekly to assess progress toward goals and to determine barriers to discharge. 12. Patient will receive at least 3 hours of therapy per day at least 5 days per week. 13. ELOS: 7 days       14. Prognosis:  excellent   Medical Problem List and Plan: 1. deconditioning related to multi-medical, history of right status post VATS as well as LVAD January 2015 2. DVT Prophylaxis/Anticoagulation: Coumadin per pharmacy. Monitor for any bleeding episodes 3. Pain Management: Tylenol as needed 3. Mood/anxiety. Low-dose Xanax as needed. 5. Neuropsych: This patient is capable of making decisions on his own behalf. 6. History of LVAD January 2015. Followup per cardiology services. Pt is competent to help direct mgt of the device as well.  7. Chronic anemia. Latest hemoglobin 8.5. Continue iron supplement 8. Hypertension. lisinopril 2.5 mg daily, Aldactone 12.5 mg daily. Monitor with increased mobility 9. Diastolic congestive heart failure. Continue Lasix 40 mg daily. Monitor for any signs of fluid overload.  -check daily weights, follow I's and O's 10.[name] Hypothyroidism. Synthroid 11. Wound care: continue dressings to current sites, with regular inspection  Meredith Staggers, MD, [place] [facility]  11/16/2013

## 2013-11-17 NOTE — Progress Notes (Signed)
Patient with wife, and LVAD nurse at bedside. LVAD nurse assisted with connecting patient to main power source at bedside. Patient and patient's wife oriented to rehab unit and given rehab information packet, and all questions answered. Will continue to monitor patient.

## 2013-11-17 NOTE — Progress Notes (Signed)
ANTICOAGULATION CONSULT NOTE - Follow-Up Consult  Pharmacy Consult for Coumadin/Heparin Indication: LVAD  Allergies  Allergen Reactions  . Diovan [Valsartan] Other (See Comments)    Hypotension at low dose, hyperkalemia  . Lipitor [Atorvastatin Calcium] Other (See Comments)    Muscle pain    Patient Measurements: Height: 5\' 9"  (175.3 cm) Weight:  (every other day) IBW/kg (Calculated) : 70.7 Heparin Dosing Weight: n/a  Vital Signs: Temp: 98 F (36.7 C) (03/19 2106) Temp src: Oral (03/19 2106) BP: 99/84 mmHg (03/19 2106) Pulse Rate: 92 (03/19 2106)  Labs:  Recent Labs  11/15/13 0600  11/16/13 0510 11/16/13 2254 11/17/13 0550  HGB 8.4*  --  8.5*  --   --   HCT 26.2*  --  26.3*  --   --   PLT 241  --  272  --   --   APTT 35  < > 62* 59* 68*  LABPROT 14.6  --  14.3  --  16.3*  INR 1.16  --  1.13  --  1.34  CREATININE 0.85  --  0.88  --  0.99  < > = values in this interval not displayed.  Estimated Creatinine Clearance: 69.4 ml/min (by C-G formula based on Cr of 0.99).   Medical History: Past Medical History  Diagnosis Date  . Ischemic cardiomyopathy     a. 10/09 Echo: Sev LV Dysfxn, inf/lat AK, Mod MR.  [name];  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 Hypertension   . PVC (premature ventricular contraction)   . Pacemaker   . CHF (congestive heart failure)   . Shortness of breath   . Automatic implantable cardioverter-defibrillator in situ     Medications:  Scheduled:  . antiseptic oral rinse  15 mL Mouth Rinse Q4H  . bisacodyl  10 mg Oral Once  . bisacodyl  10 mg Rectal Once  . budesonide (PULMICORT) nebulizer  solution  0.25 mg Nebulization BID  . feeding supplement (ENSURE COMPLETE)  237 mL Oral Q1500  . ferrous sulfate  325 mg Oral TID WC  . furosemide  40 mg Oral Daily  . insulin aspart  0-9 Units Subcutaneous TID WC  . levothyroxine  200 mcg Oral QAC breakfast  . lisinopril  2.5 mg Oral Daily  . metoCLOPramide  10 mg Oral TID AC & HS  . pantoprazole  40 mg Oral Daily  . spironolactone  12.5 mg Oral Daily  . warfarin  7.5 mg Oral ONCE-1800  . Warfarin - Pharmacist Dosing Inpatient   Does not apply q1800    Assessment: 73 yo male with hx CAD, and HF s/p LVAD HMII 09/19/13.  Admitted 3/4 with SBO/ileus, and Coumadin was held in setting of R hemothorax, s/p chest tube and multiple units transfusion.  Also had R chest drain placed on 3/13 removed 3/18.  Has remained on heparin IV infusion  with goal PTT 50-60.  Current drip rate 1500 uts/hr aPTT 68 sec which is lightly > than set goal by TCTS  - this was  peripheral stick per pt as heparin is running in through central line, CBC stable, no bleeding noted.  Today's INR at 1.3 starting to bump with increased dose of Coumaidn.  Previous Coumadin dose 5mg  daily except 2.5mg  TTS.  This dose held his INR between 2-3 as outpatient but he was also on amiodarone.  Amiodarone was discontinued on this admission.   This increases Coumadin dose requirement.    Goal of Therapy:  INR 2-3 Monitor platelets by anticoagulation protocol: Yes   Plan:  1. Coumadin 7.5 mg po x 1 again tonight. 2. Continue daily PT/INR. 3. decrease IV heparin to 1450 units/hr. 4. Recheck PTT, Protime with AM labs. 5. Will monitor for s/s bleeding, or any return of hemothorax.  Bonnita Nasuti Pharm.D. CPP, BCPS Clinical Pharmacist [phone] 11/17/2013 7:33 AM

## 2013-11-17 NOTE — Progress Notes (Signed)
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 He is currently supported with a periph iv only.   Patient developed cardiac arrest from right hemo-thorax tension on right heart and was intubated with ventilator-dependent respiratory failure for 7 days. Now extubated, doing well The chest x-ray shows significant clearing after the procedures and drainage tubes were placed. His bronchial washings are negative and white count normal. A chest tubes are now out.  The patient's LVAD function has been normal since evacuation of right hemothorax and he is maintained sinus rhythm. He is on heparin infusion while Coumadin is being resumed in until INR becomes therapeutic.INR today remains less than 1.5  The partial small bowel obstruction is resolved he currently has also resolved his postop ileus.  The patient currently is having BM's with normal KUB--advance to mechanical soft diet. Flat and upright of abdomen today show normal gas pattern.  The patient's pulmonary status has progressively improved after right VATS and subsequent pigtail catheter drainage and he was weaned and extubated . He is breathing comfortably with a strong cough. pigtail catheter was removed . Chest x-ray today shows no significant pleural effusion, mild pleural thickening on right  Patient did well overnight andrested much better. No problemswith abdominal bloating or discomfort, nausea.  The patient is been evaluated for inpatient  rehabilitation and appears to be an excellent candidate.He was accepted by CIR 3-20-015  LVAD INTERROGATION HeartMate II LVAD:  Flow 4.2 liters/min, speed 9400 RPM, power 5.3, PI 5.5  1 PI event   Objective:    Vital Signs:   Temp:  [97.5 F (36.4 C)-98.2 F (36.8 C)] 98.1 F (36.7 C) (03/20 1500) Pulse Rate:  [88-92] 90 (03/20 1500) Resp:  [16-18] 16 (03/20 1500) BP: (99)/(84) 99/84 mmHg (03/19 2106) SpO2:  [98 %-99 %] 98 % (03/20 1500)   Mean arterial Pressure  82-90 mm Hg Intake/Output:   Intake/Output Summary (Last 24 hours) at 11/17/13 1825 Last data filed at 11/17/13 1609  Gross per 24 hour  Intake      0 ml  Output    250 ml  Net   -250 ml     Physical Exam: General:  Well appearing. extubated and responsive, breathing comfortably  HEENT: normal Neck: supple. Mild JVD; no bruits. No lymphadenopathy or thryomegaly appreciated. Cor: Mechanical heart sounds with LVAD hum present. Lungs: clear, right VATS and Chest tube incisions clean and dry Abdomen: soft, nontender, mildly distended. No hepatosplenomegaly. No bruits or masses.  Drive line     dressing dry and intact, bowel sounds  normal. Driveline: C/D/I; securement device intact and driveline incorporated Extremities: no cyanosis, clubbing, rash, mild edema, extremities warm  Neuro: Intact moves all extremities but weak  Telemetry: sinus rhythm  Labs: Basic Metabolic Panel:  Recent Labs Lab 11/11/13 0408  11/13/13 0400 11/14/13 0511 11/15/13 0600 11/16/13 0510 11/17/13 0550  NA 137  < > 134* 138 139 138 140  K 3.6*  < > 4.1 3.9 3.7 3.4* 3.8  CL 100  < > 98 100 100 100 103  CO2 26  < > 23  28 26 25 24   GLUCOSE 144*  < > 163* 125* 124* 104* 88  BUN 20  < > 23 24* 21 18 17   CREATININE 0.88  < > 0.87 0.90 0.85 0.88 0.99  CALCIUM 8.1*  < > 8.5 8.2* 8.5 8.4 8.3*  MG 2.1  --  2.1  --   --  2.1  --   PHOS 2.5  --  3.0  --   --  3.6  --   < > = values in this interval not displayed.  Liver Function  Tests:  Recent Labs Lab 11/13/13 0400 11/14/13 0511 11/15/13 0600 11/16/13 0510 11/17/13 0550  AST 31 26 29 24 24   ALT 50 35 38 32 31  ALKPHOS 152* 124* 145* 151* 146*  BILITOT 1.1 1.2 0.9 0.8 0.8  PROT 6.7 6.0 6.1 6.5 6.5  ALBUMIN 2.6* 2.2* 2.3* 2.4* 2.5*   No results found for this basename: LIPASE, AMYLASE,  in the last 168 hours No results found for this basename: AMMONIA,  in the last 168 hours  CBC:  Recent Labs Lab 11/12/13 0330 11/13/13 0400 11/14/13 0511 11/15/13 0600 11/16/13 0510  WBC 7.2 12.2* 9.2 7.7 7.6  HGB 8.9* 9.3* 8.9* 8.4* 8.5*  HCT 27.1* 28.7* 27.8* 26.2* 26.3*  MCV 88.6 89.1 90.3 90.3 90.7  PLT 171 224 250 241 272    INR:  Recent Labs Lab 11/13/13 0400 11/14/13 0511 11/15/13 0600 11/16/13 0510 11/17/13 0550  INR 1.22 1.23 1.16 1.13 1.34    Other results:  EKG:   Imaging: Dg Chest 2 View  11/16/2013   CLINICAL DATA:  Followup right hemothorax. Cardiac arrest. Left ventricular assist device.  EXAM: CHEST  2 VIEW  COMPARISON:  11/15/2013  FINDINGS: Mild bilateral pleural thickening remains stable. No evidence of pleural effusion. No evidence of pneumothorax.  Airspace opacity in the posterior right upper lobe is stable. No new or worsening areas of pulmonary opacity are seen. Cardiomegaly stable. Support apparatus remains in appropriate position.  IMPRESSION: Stable airspace opacity in posterior right upper lobe and bilateral pleural thickening.   Electronically Signed   By: Earle Gell M.D.   On: 11/16/2013 08:10   Dg Abd 2 Views  11/16/2013   CLINICAL DATA:  Abdominal distention.  Followup ileus  EXAM: ABDOMEN - 2 VIEW  COMPARISON:  11/14/2013  FINDINGS: Previously seen mild dilatation of small bowel and gaseous distention of colon has resolved, consistent with resolving ileus. No evidence of radiopaque calculi. No evidence of free air.  IMPRESSION: Interval resolution of ileus pattern. Unremarkable bowel gas pattern.   Electronically Signed    By: Earle Gell M.D.   On: 11/16/2013 08:11     Medications:     Scheduled Medications: . antiseptic oral rinse  15 mL Mouth Rinse Q4H  . [START ON 11/18/2013] aspirin EC  325 mg Oral Daily  . budesonide (PULMICORT) nebulizer solution  0.25 mg Nebulization BID  . [START ON 11/18/2013] citalopram  10 mg Oral Daily  . ferrous sulfate  325 mg Oral TID WC  . [START ON 11/18/2013] furosemide  40 mg Oral Daily  . [START ON 11/18/2013] levothyroxine  200 mcg Oral QAC breakfast  . [START ON 11/18/2013] lisinopril  2.5 mg Oral Daily  . metoCLOPramide  10 mg Oral TID AC & HS  . [START ON 11/18/2013] pantoprazole  40 mg Oral Daily  . [START ON 11/18/2013] spironolactone  12.5 mg Oral Daily  . Warfarin - Pharmacist Dosing Inpatient  Does not apply q1800    Infusions: . heparin 1,450 Units/hr (11/17/13 1756)    PRN Medications: ondansetron (ZOFRAN) IV, ondansetron, sorbitol, zolpidem   Assessment:   Cont heparin until INR 2.0- starting po coumadin  Systemic anticoagulation with heparin, goal PTT 50-60  Continue PT,appreciate expert rehab therapy by CIR team.  Drive line dressing changes per protocol  Follow closely for sign of small bowel obstruction Plan/Discussion:      I reviewed the LVAD parameters from today, and compared the results to the patient's prior recorded data.  No programming changes were made.  The LVAD is functioning within specified parameters.  The patient performs LVAD self-test daily.  LVAD interrogation was negative for any significant power changes, alarms or PI events/speed drops.  LVAD equipment check completed and is in good working order.  Back-up equipment present.   LVAD education done on emergency procedures and precautions and reviewed exit site care.  Length of Stay: 0  VAN TRIGT III,PETER 11/17/2013, 6:25 PM  VAD Team Pager [phone] (7am - 7am)

## 2013-11-17 NOTE — Progress Notes (Signed)
Patient refused to wear BIPAP tonight.

## 2013-11-17 NOTE — Care Management Note (Signed)
    Page 1 of 1   11/17/2013     4:02:10 PM   CARE MANAGEMENT NOTE 11/17/2013  Patient:  Johnny Oneill, Johnny Oneill   Account Number:  [id number]  Date Initiated:  11/03/2013  Documentation initiated by:  MAYO,HENRIETTA  Subjective/Objective Assessment:   dx SBO  S/P VAD 09/18/13  Lives with spouse, has rolling walker, active with [facility]  PCP  Dr Lavone Orn     Action/Plan:   PT EVALUATED BY CIR AND FOUND TO BE APPROPRIATE FOR ADMISSION   Anticipated DC Date:  11/17/2013   Anticipated DC Plan:  IP [place]  CM consult      Choice offered to / List presented to:             Status of service:  Completed, signed off Medicare Important Message given?   (If response is "NO", the following Medicare IM given date fields will be blank) Date Medicare IM given:   Date Additional Medicare IM given:    Discharge Disposition:  IP REHAB FACILITY  Per UR Regulation:  Reviewed for med. necessity/level of care/duration of stay  If discussed at [place] of Stay Meetings, dates discussed:   11/07/2013  11/09/2013  11/14/2013  11/16/2013    Comments:  11/17/13 Nolyn Eilert,RN,BSN [phone] PT DISCHARGING TO [facility] IP [facility]HAB TODAY.

## 2013-11-17 NOTE — Interval H&P Note (Signed)
Johnny Oneill was admitted today to Inpatient Rehabilitation with the diagnosis of deconditioning after Hemothorax.  The patient's history has been reviewed, patient examined, and there is no change in status.  Patient continues to be appropriate for intensive inpatient rehabilitation.  I have reviewed the patient's chart and labs.  Questions were answered to the patient's satisfaction.  SWARTZ,ZACHARY T 11/17/2013, 4:21 PM

## 2013-11-17 NOTE — Discharge Summary (Signed)
Advanced Heart Failure Team  Discharge Summary   Patient ID: Johnny Oneill MRN: [id number], DOB/AGE: [id number] 71 y.o. 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   He was seen in the HF clinic for his routine LVAD appointment when at that time he had a syncopal episode in the waiting area. He did not hit his head and was brought back to the clinic and was found to have MAPs in the 60s with low PIs. He was fel to be volume depleted. He was given  1 liter of IVF in the clinic with resolution of his orthostasis. ICD interrogation showed no VT. He had a bedside ECHO which looked fine. His BB was stopped and he went home.   On the way home, his wife stopped at [facility] to pick up his medicines, upon returning to the car she found him lethargic and vomiting. He was brought to the ED a nd was more alert. However, he was c/o ab pain and KUB showed a SBO. He was seen by [facility] and an NG was placed for decompression and a PICC was ordered for TPN. They were unable to get a PICC so a CVL was placed at bedside. Multiple attempts were made at placing a RIJ central line but even with ultrasound guidance this was unsuccessful. The P/CCM team then place a right subclavian line without difficulty.   He was placed on IV heparin while he was NPO. He was clinically improving and KUB showed resolving SBO.  However, on 11/06/13 he developed progressive dyspnea and respiratory distress associated with weakness and nausea and MAPs dropped into the 60s. His co-ox at that time was 51% and Hgb 6.7 with a CVP of 22. He was started on milrinone for presumed RV failure and CBC/lactate obtained. CBC confirmed dropping Hgb and his lactate 3.2. MAPs dropped to 50 so milrinone stopped and switched to levophed. Dr. Haroldine Laws and the Critical Care team responded to the bedside. 4u RBCs ordered and 2 FFP.  Emergent echo performed at bedside showed LV unloading well. Normal RV function. No effusion. An ABG was obtained with pH 7.1 and he was given 3 amps bicarb and he was intubated by the CCM team. After intubation patient developed cardia/PEA arrest with no flow on LVAD pump. Epi given and cautious CPR begun. Return of circulation then obtained after about 30 seconds and CPR stopped. Patient developed VT and defibrillated x 2 and amiodarone started. With persistent hypotension vasopressin added and bicarb drip started. CCM placed femoral A-line and triple lumen catheter. CXR revelaed a R hemothorax and a R CT was placed by Dr. Prescott Gum with immediate 1L of bloody fluid drained.   His CT continued to show persistent loculated fluid collection despite chest tube drainage. He remained intubated and on multiple pressors and his CXR continued to show diffuse RUL opacity. On 3/13 he underwent CT-guided placement of R chest drain  by IR and CXR improved mildly. The drainage was sluggish d/t clot and IR placed TPA to dwell in line, which improved the drainage and his CXR began to improve. On 3/15 he was extubated and his pressors were able to be weaned off. His illeus improved and his TPN was slowly weaned off and started back on clear liquids after passing his swallow study and his diet was advanced as tolerated. He was also started on Celexa and will be increased as he tolerates for depression/anxiety. He worked with CR and PT/OT while his  stay and it was felt he could benefit from CIR. A consult was placed and he was transferred to CIR on 11/17/13. At the time of transfer his VSS and VAD parameters were stable.   LVAD Interrogation HM II:   Speed:  9400  Flow:4.6   PI: 6.2     Power: 5.6          Discharge Weight Range: 157-160 lbs  Discharge Vitals: Blood pressure 99/84, pulse 92, temperature 98.2 F (36.8 C), temperature source Oral, resp. rate 18, height 5\' 9"  (1.753 m), weight 159 lb 2.8 oz (72.2 kg), SpO2 99.00%.  Labs: Lab Results  Component Value Date   WBC 7.6 11/16/2013   HGB 8.5* 11/16/2013   HCT 26.3* 11/16/2013   MCV 90.7 11/16/2013   PLT 272 11/16/2013     Recent Labs Lab 11/17/13 0550  NA 140  K 3.8  CL 103  CO2 24  BUN 17  CREATININE 0.99  CALCIUM 8.3*  PROT 6.5  BILITOT 0.8  ALKPHOS 146*  ALT 31  AST 24  GLUCOSE 88   Lab Results  Component Value Date   CHOL 59 09/14/2013   HDL 24* 09/14/2013   LDLCALC 22 09/14/2013   TRIG 93 11/14/2013   BNP (last 3 results)  Recent Labs  09/13/13 1349 09/25/13 0400 11/01/13 1033  PROBNP 3883.0* 7019.0* 877.6*    Diagnostic Studies/Procedures   Dg Chest 2 View  11/16/2013   CLINICAL DATA:  Followup right hemothorax. Cardiac arrest. Left ventricular assist device.  EXAM: CHEST  2 VIEW  COMPARISON:  11/15/2013  FINDINGS: Mild bilateral pleural thickening remains stable. No evidence of pleural effusion. No evidence of pneumothorax.  Airspace opacity in the posterior right upper lobe is stable. No new or worsening areas of pulmonary opacity are seen. Cardiomegaly stable. Support apparatus remains in appropriate position.  IMPRESSION: Stable airspace opacity in posterior right upper lobe and bilateral pleural thickening.   Electronically Signed   By: Earle Gell M.D.   On: 11/16/2013 08:10   Dg Abd 2 Views  11/16/2013   CLINICAL DATA:  Abdominal distention.  Followup ileus  EXAM: ABDOMEN - 2 VIEW  COMPARISON:  11/14/2013  FINDINGS: Previously seen mild  dilatation of small bowel and gaseous distention of colon has resolved, consistent with resolving ileus. No evidence of radiopaque calculi. No evidence of free air.  IMPRESSION: Interval resolution of ileus pattern. Unremarkable bowel gas pattern.   Electronically Signed   By: Earle Gell M.D.   On: 11/16/2013 08:11    Discharge Medications     Medication List    ASK your doctor about these medications       amiodarone 200 MG tablet  Commonly known as:  PACERONE  Take 1 tablet (200 mg total) by mouth 2 (two) times daily.     amLODipine 10 MG tablet  Commonly known as:  NORVASC  Take 1 tablet (10 mg total) by  mouth daily.     aspirin 325 MG tablet  Take 325 mg by mouth daily.     cephALEXin 500 MG capsule  Commonly known as:  KEFLEX  Take 500 mg by mouth 2 (two) times daily. 5 day course filled 11/01/12     clindamycin 150 MG capsule  Commonly known as:  CLEOCIN  Take 600 mg by mouth once.     CoQ10 200 MG Caps  Take 200 mg by mouth daily.     ezetimibe 10 MG tablet  Commonly known as:  ZETIA  Take 10 mg by mouth daily.     ferrous [id number] C-folic acid capsule  Commonly known as:  TRINSICON / FOLTRIN  Take 1 capsule by mouth every morning.     ibuprofen 200 MG tablet  Commonly known as:  ADVIL,MOTRIN  Take 200 mg by mouth daily as needed (pain).     levothyroxine 150 MCG tablet  Commonly known as:  SYNTHROID, LEVOTHROID  Take 75-150 mcg by mouth daily before breakfast. Take 1 tablet (150 mcg) daily on an empty stomach EXCEPT  take 1/2 tablet (75 mcg) every Friday     nitroGLYCERIN 0.4 MG SL tablet  Commonly known as:  NITROSTAT  Place 0.4 mg under the tongue every 5 (five) minutes as needed for chest pain.     Omega-3 300 MG Caps  Take 300 mg by mouth at bedtime.     pantoprazole 40 MG tablet  Commonly known as:  PROTONIX  Take 40 mg by mouth daily.     rosuvastatin 20 MG tablet  Commonly known as:  CRESTOR  Take 20 mg by mouth daily.      warfarin 5 MG tablet  Commonly known as:  COUMADIN  Take 5 mg by mouth daily at 6 PM. Take 1/2 tablet (2.5 mg) on Tues, Thurs, Saturday, take 1 tablet (5 mg) on Sunday, Monday, Wednesday, Friday        Disposition   The patient will be discharged in stable condition to home.  Future Appointments Provider Department Dept Phone   11/18/2013 8:00 AM [place] A [phone]   11/18/2013 10:30 AM [place] A [phone]   11/18/2013 11:00 AM Kerrie Buffalo, [place] A [phone]   11/18/2013 2:30 PM [place] A [phone]   11/19/2013 2:00 PM ADELINE HUEBSCH, [place] A [phone]   11/20/2013 9:30 AM Salome Spotted, [place] A [phone]   11/20/2013 10:30 AM [place] A [phone]   11/20/2013 1:00 PM Salome Spotted, [place] A [phone]   11/20/2013 2:00 PM Georgina Peer, [place] A [phone]         Duration of Discharge Encounter: Greater than 35 minutes   Signed, Rande Brunt  11/17/2013, 3:55 PM  Patient seen and examined with Junie Bame, NP. We discussed all aspects of the encounter. I agree with the assessment and plan as stated above. I have edited the note to reflect my changes.  Jamal Pavon,MD 10:22 PM

## 2013-11-17 NOTE — Progress Notes (Signed)
Advanced Heart Failure Team Rounding Note     Subjective:    [name] is a 73 year old patient with a history of CAD and HF s/p LVAD HMII 09/19/2013 currently [facility] Transplant list .   Admitted to [facility] 09/13/13 through 10/03/13. Initially admitted with decompenstaed heart failure and low output. Required IABP for stabilization. He ultimately had LVAD HM II placed on 09/19/13. He suffered from RV failure after the procedure and required milrinone for >7 days which was gradually weaned off. D/C weight 151 pounds.   Admitted on 3/4 with SBO/ileus. Was improving then had cardiac arrest last night (3/8) with PEA in setting of R hemothorax. Now s/p R chest tube and multiple units transfusion. Underwent R VATs 3/9 then bronch on 3/10. On 3/13 underwent placement of R chest drain by IR.   Doing well. Tolerating solid foods. Getting stronger slowly. Weight up 1 pound. MAP 88-90. Arlyce Harman added  LVAD INTERROGATION:  HeartMate II LVAD: Flow 4.8 liters/min, speed 9400, power 5.1 PI 5.4; No PI event   Physical Exam: GENERAL: Fatigued appearing; conversant HEENT: normal   NECK: Supple, JVP 7 CARDIAC: Mechanical heart sounds with LVAD hum present.  LUNGS: R chest tube. Decreased on R ABDOMEN: soft nontender,  Bowel sounds hypoactive LVAD exit site: well-healed and incorporated. Dressing dry and intact. No erythema or drainage. Stabilization device present and accurately applied. Driveline dressing is being changed daily per sterile technique.  EXTREMITIES: Warm and dry, no cyanosis, clubbing, rash or edema  NEUROLOGIC: awake non-focal.  Telemetry: SR   Labs: Basic Metabolic Panel:  Recent Labs Lab 11/11/13 0408 11/12/13 0330 11/13/13 0400 11/14/13 0511 11/15/13 0600 11/16/13 0510  NA 137 135* 134* 138 139 138  K 3.6* 3.7 4.1 3.9 3.7 3.4*  CL 100 99 98 100 100 100  CO2 26 26 23 28 26 25   GLUCOSE 144* 158* 163* 125* 124* 104*  BUN 20 21 23  24* 21 18  CREATININE 0.88 0.86 0.87 0.90 0.85 0.88   CALCIUM 8.1* 8.2* 8.5 8.2* 8.5 8.4  MG 2.1  --  2.1  --   --  2.1  PHOS 2.5  --  3.0  --   --  3.6    Liver Function Tests:  Recent Labs Lab 11/12/13 0330 11/13/13 0400 11/14/13 0511 11/15/13 0600 11/16/13 0510  AST 30 31 26 29 24   ALT 53 50 35 38 32  ALKPHOS 147* 152* 124* 145* 151*  BILITOT 1.1 1.1 1.2 0.9 0.8  PROT 6.1 6.7 6.0 6.1 6.5  ALBUMIN 2.3* 2.6* 2.2* 2.3* 2.4*   No results found for this basename: LIPASE, AMYLASE,  in the last 168 hours No results found for this basename: AMMONIA,  in the last 168 hours  CBC:  Recent Labs Lab 11/12/13 0330 11/13/13 0400 11/14/13 0511 11/15/13 0600 11/16/13 0510  WBC 7.2 12.2* 9.2 7.7 7.6  HGB 8.9* 9.3* 8.9* 8.4* 8.5*  HCT 27.1* 28.7* 27.8* 26.2* 26.3*  MCV 88.6 89.1 90.3 90.3 90.7  PLT 171 224 250 241 272    Cardiac Enzymes: No results found for this basename: CKTOTAL, CKMB, CKMBINDEX, TROPONINI,  in the last 168 hours  BNP: BNP (last 3 results)  Recent Labs  09/13/13 1349 09/25/13 0400 11/01/13 1033  PROBNP 3883.0* 7019.0* 877.6*    CBG:  Recent Labs Lab 11/16/13 0816 11/16/13 1112 11/16/13 1200 11/16/13 1555 11/16/13 2000  GLUCAP 91 103* 100* 106* 109*    Coagulation Studies:  Recent Labs  11/15/13 0600 11/16/13 0510 11/17/13 0550  LABPROT 14.6 14.3 16.3*  INR 1.16 1.13 1.34    Other results:   Imaging: Dg Chest 2 View  11/16/2013   CLINICAL DATA:  Followup right hemothorax. Cardiac arrest. Left ventricular assist device.  EXAM: CHEST  2 VIEW  COMPARISON:  11/15/2013  FINDINGS: Mild bilateral pleural thickening remains stable. No evidence of pleural effusion. No evidence of pneumothorax.  Airspace opacity in the posterior right upper lobe is stable. No new or worsening areas of pulmonary opacity are seen. Cardiomegaly stable. Support apparatus remains in appropriate position.  IMPRESSION: Stable airspace opacity in posterior right upper lobe and bilateral pleural thickening.    Electronically Signed   By: Earle Gell M.D.   On: 11/16/2013 08:10   Dg Chest Port 1 View  11/15/2013   CLINICAL DATA:  Right apical pigtail catheter removal. Left ventricular assist device due to ischemic cardiomyopathy.  EXAM: PORTABLE CHEST - 1 VIEW  COMPARISON:  DG CHEST 1V PORT dated 11/15/2013; DG CHEST 1V PORT dated 11/14/2013  FINDINGS: The right-sided pigtail catheter has been removed. Stable airspace opacity above the minor fissure and stable pleural thickening on the right. No pneumothorax.  Right IJ line tip:  SVC.  AICD noted.  The LVAD is partially seen.  Stable indistinct interstitial accentuation in the lung bases along with cardiomegaly.  IMPRESSION: 1. The right pleural drainage catheter has been removed but the radiographic appearance of the chest is otherwise stable. No pneumothorax or specific complicating feature related to the drainage catheter removal observed.   Electronically Signed   By: Sherryl Barters M.D.   On: 11/15/2013 11:54   Dg Chest Port 1 View  11/15/2013   CLINICAL DATA:  LVAD.  Chest tubes.  EXAM: PORTABLE CHEST - 1 VIEW  COMPARISON:  11/14/2013  FINDINGS: Left ventricular assist device remains in place. A left-sided dual lead ICD remains in place. Prior median sternotomy is noted. Right jugular central venous catheter is unchanged with tip overlying the lower SVC. Right pleural pigtail catheter projects over the right mid lung. The cardiac silhouette remains enlarged, unchanged. Small right pleural effusion does not appear significantly changed, including a more focal, possibly loculated component along the lateral aspect of the mid right hemithorax. No pneumothorax is identified. Mild parenchymal opacity in the right mid lung may have slightly increased.  IMPRESSION: 1. Unchanged position of support devices. 2. Unchanged right pleural effusion with pleural catheter in place. 3. Stable to slightly increased parenchymal opacity in the right mid to lower lung.    Electronically Signed   By: Logan Bores   On: 11/15/2013 09:07   Dg Abd 2 Views  11/16/2013   CLINICAL DATA:  Abdominal distention.  Followup ileus  EXAM: ABDOMEN - 2 VIEW  COMPARISON:  11/14/2013  FINDINGS: Previously seen mild dilatation of small bowel and gaseous distention of colon has resolved, consistent with resolving ileus. No evidence of radiopaque calculi. No evidence of free air.  IMPRESSION: Interval resolution of ileus pattern. Unremarkable bowel gas pattern.   Electronically Signed   By: Earle Gell M.D.   On: 11/16/2013 08:11     Assessment:   1. Hemorrhagic shock due to R hemothorax    --s/p R chest tube 3/8 2. 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 CAD 4. Hypothyroidism 5. SBO 6. Acute blood loss anemia 7. PEA arrest 8. Shock liver 9. A/C renal failure, stage III -resolved 10. Ventilatory-dependent respiratory failure 11. Hypokalemia  Plan/Discussion:    Stable overnight. Continues on mechanical  soft diet which he is tolerating, will not advance today still hypoactive BS. Will continue to wean TPN.   Doing well. Continues to progress though still weak. Ileus resolved.  MAPs and volume status looks good with spiro. Would not overdiurese. INR starting to bump. Continue heparin/coumadin.  VAD parameters look good.  I spoke with Dr. Tessa Lerner from [place] this am. They will re-evaluate him this am. We discussed that the normal CIR nursing staff ration would be adequate for Mr. Davydov as he is very stable from HF/VAD perspective and the VAD team would be available 24/7 to support the CIR staff as needed. My personal pager is [phone]. VAD pager is [phone].  Benay Spice 6:36 AM

## 2013-11-17 NOTE — PMR Pre-admission (Signed)
PMR Admission Coordinator Pre-Admission Assessment  Patient: Johnny Oneill is an 73 y.o., male MRN: [id number] DOB: 12-Aug-1941 Height: 5\' 9"  (175.3 cm) Weight:  (every other day)              Insurance Information HMO:  No    PPO:       PCP:       IPA:       80/20:       OTHER:   PRIMARY: Medicare A/B      Policy#: [id number]      Subscriber: Johnny Oneill CM Name:        Phone#:       Fax#:   Pre-Cert#:        Employer: Retired Benefits:  Phone #:       Name: Checked in [place]. Date: 07/31/08     Deduct: $1260      Out of Pocket Max: none      Life Max: unlimited CIR: 100%      SNF: 100 days   LBD=10/03/13 Outpatient: 80%     Co-Pay: 20% Home Health: 100%      Co-Pay: none DME: 80%     Co-Pay: 20% Providers: patient's choice  SECONDARY: [employer] of [place]      Policy#: [id number]      Subscriber: Johnny Oneill CM Name:        Phone#:       Fax#:   Pre-Cert#:        Employer: Retired Benefits:  Phone #: [phone]     Name:   Eff. Date:       Deduct:        Out of Pocket Max:        Life Max:   CIR:        SNF:   Outpatient:       Co-Pay:   Home Health:        Co-Pay:   DME:       Co-Pay:     Emergency Contact Information Contact Information   Name Relation Home Work Mobile   [place] [phone]     Current Medical History  Patient Admitting Diagnosis: deconditioning related to multiple medical, htx on right s/p vats  History of Present Illness: A 73 y.o. right-handed male with complicated medical history of CAD status post stent, chronic Coumadin, chronic systolic heart failure, status post LVAD 09/19/2013 currently [facility] transplant list. Presented 11/01/2013 after followup in the heart failure clinic with syncopal episode as well as vomiting and lethargy. CT of the abdomen consistent with small bowel obstruction. A nasogastric tube was placed and followup per Gen. surgery. TPN was initiated for nutritional support. Hospital course  complicated by cardiac arrest 11/05/2013 with PEA in the setting of right hemothorax with right chest tube placed 11/06/2013 as well as VATS procedure and subsequent interventional radiology pigtail drainage of loculated right apical hematoma. Patient small bowel obstruction ileus continues to improve with diet advanced to mechanical soft. Patient did require intubation until 11/12/2013. Therapies have been initiated with progressive gains ambulating 120 feet pushing his wheelchair. M.D. has requested physical medicine rehabilitation consult.    Past Medical History  Past Medical History  Diagnosis Date  . Ischemic cardiomyopathy     a. 10/09 Echo: Sev LV Dysfxn, inf/lat AK, Mod MR.  [name] Hypercholesteremia   . Osteoarthritis     a. s/p R TKR  . Anxiety   . Hypothyroidism   .  CAD (coronary artery disease)     S/P stenting of LCX in 2004;  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 Hypertension   . PVC (premature ventricular contraction)   . Pacemaker   . CHF (congestive heart failure)   . Shortness of breath   . Automatic implantable cardioverter-defibrillator in situ     Family History  family history includes Coronary artery disease in his mother; Heart disease in his mother; Heart failure in his father; Hyperlipidemia in his sister; Stroke in his father.  Prior Rehab/Hospitalizations:  Had outpatient therapy for LVAD.   Current Medications  Current facility-administered medications:0.9 %  sodium chloride infusion, , Intravenous, Continuous, Ivin Poot, MD, Last Rate: 20 mL/hr at 11/14/13 1400;  antiseptic oral rinse (BIOTENE) solution 15 mL, 15 mL, Mouth Rinse, Q4H, Ivin Poot, MD, 15 mL at 11/16/13 1600;  aspirin EC tablet 325 mg, 325 mg, Oral, Daily, Jolaine Artist, MD, 325 mg at  11/17/13 [id number] bisacodyl (DULCOLAX) EC tablet 10 mg, 10 mg, Oral, Once, Ivin Poot, MD;  bisacodyl (DULCOLAX) suppository 10 mg, 10 mg, Rectal, Once, Ivin Poot, MD;  budesonide (PULMICORT) nebulizer solution 0.25 mg, 0.25 mg, Nebulization, BID, Ivin Poot, MD, 0.25 mg at 11/17/13 [id number];  diphenhydrAMINE (BENADRYL) injection 25 mg, 25 mg, Intravenous, QHS PRN, Jolaine Artist, MD, 25 mg at 11/16/13 2252 feeding supplement (ENSURE COMPLETE) (ENSURE COMPLETE) liquid 237 mL, 237 mL, Oral, Q1500, Rogue Bussing, RD, 237 mL at 11/16/13 1500;  fentaNYL (SUBLIMAZE) injection 12.5-25 mcg, 12.5-25 mcg, Intravenous, Q4H PRN, Ivin Poot, MD, 25 mcg at 11/13/13 1750;  ferrous sulfate tablet 325 mg, 325 mg, Oral, TID WC, Rande Brunt, NP, 325 mg at 11/17/13 [id number] furosemide (LASIX) tablet 40 mg, 40 mg, Oral, Daily, Rande Brunt, NP, 40 mg at 11/17/13 1000;  heparin ADULT infusion 100 units/mL (25000 units/250 mL), 1,450 Units/hr, Intravenous, Continuous, Jolaine Artist, MD, Last Rate: 14.5 mL/hr at 11/17/13 0815, 1,450 Units/hr at 11/17/13 0815;  levothyroxine (SYNTHROID, LEVOTHROID) tablet 200 mcg, 200 mcg, Oral, QAC breakfast, Jolaine Artist, MD, 200 mcg at 11/17/13 0955 lisinopril (PRINIVIL,ZESTRIL) tablet 2.5 mg, 2.5 mg, Oral, Daily, Rande Brunt, NP, 2.5 mg at 11/17/13 1000;  LORazepam (ATIVAN) injection 1 mg, 1 mg, Intravenous, Q6H PRN, Jolaine Artist, MD, 1 mg at 11/16/13 2250;  metoCLOPramide (REGLAN) tablet 10 mg, 10 mg, Oral, TID AC & HS, Jolaine Artist, MD;  ondansetron [facility]) injection 4 mg, 4 mg, Intravenous, Q6H PRN, Jolaine Artist, MD, 4 mg at 11/13/13 1202 pantoprazole (PROTONIX) EC tablet 40 mg, 40 mg, Oral, Daily, Jolaine Artist, MD, 40 mg at 11/17/13 1003;  phenol (CHLORASEPTIC) mouth spray 1 spray, 1 spray, Mouth/Throat, PRN, Jolaine Artist, MD, 1 spray at 11/12/13 2200;  sodium chloride 0.9 % injection 10-40 mL, 10-40 mL, Intracatheter, PRN,  Jolaine Artist, MD, 10 mL at 11/03/13 0759 spironolactone (ALDACTONE) tablet 12.5 mg, 12.5 mg, Oral, Daily, Jolaine Artist, MD, 12.5 mg at 11/17/13 1001;  warfarin (COUMADIN) tablet 7.5 mg, 7.5 mg, Oral, ONCE-1800, Jolaine Artist, MD;  Warfarin - Pharmacist Dosing Inpatient, , Does not apply, [id number], Jolaine Artist, MD  Patients Current Diet: Dysphagia  Precautions / Restrictions Precautions Precautions: Fall Precaution Comments: dizziness of late.  PI's fluctuating down into the high 4's Restrictions Weight Bearing Restrictions: No Other Position/Activity Restrictions: Sternal precautions   Prior Activity Level Community (5-7x/wk): Went out daily.  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  Development worker, international aid / [place] Devices/Equipment: Eyeglasses;Other (Comment) (LVAD) Home Equipment: Walker - 2 wheels;Tub bench;Shower seat  Prior Functional Level Prior Function Level of Independence: Independent with assistive device(s) Comments: driving, out and about  Current Functional Level Cognition  Overall Cognitive Status: Within Functional Limits for tasks assessed Orientation Level: Oriented X4 Following Commands: Follows one step commands consistently    Extremity Assessment (includes Sensation/Coordination)          ADLs  Anticipate needs concerning bathing, dressing and toilet transfers/hygiene.   Mobility  Overal bed mobility: Needs Assistance Bed Mobility: Supine to Sit Supine to sit: Min assist (rail) Sit to supine: Mod assist;+2 for physical assistance General bed mobility comments: minimal truncal assist due to sternal precautions    Transfers  Overall transfer level: Needs assistance Equipment used: Rolling walker (2 wheeled) Transfers: Sit to/from Stand Sit to Stand: Min assist;From elevated surface Stand pivot transfers: Mod assist;+2 safety/equipment General transfer comment: reinforced  sternal precautions.    Ambulation / Gait / Stairs / Wheelchair Mobility  Ambulation/Gait Ambulation/Gait assistance: Museum/gallery curator (Feet): 200 Feet Assistive device: Rolling walker (2 wheeled) Gait Pattern/deviations: Step-through pattern;Drifts right/left Gait velocity: slow Gait velocity interpretation: Below normal speed for age/gender General Gait Details: mildly unsteady gait with list left, drift left unless stabilized.  Pt became moderately dyspneic and when checked the sats were 83% on RA,  HR in the 100's    Posture / Balance Dynamic Sitting Balance Sitting balance - Comments: tendency to list posteriorly    Special needs/care consideration BiPAP/CPAP No CPM No Continuous Drip IV No Dialysis No       Life Vest No Oxygen No Special BedNo Trach Size No Wound Vac (area) No    Skin Has dressing changes right abdomen to LVAD QOD.                             Bowel mgmt: Having daily BMs Bladder mgmt: Voiding in urinal Diabetic mgmt No LVAD - Limited code, no CPR, no chest compressions    Previous Home Environment Living Arrangements: Spouse/significant other Available Help at Discharge: Family Type of Home: House Home Layout: Two level;Able to live on main level with bedroom/bathroom Alternate Level Stairs-Rails: Left;Right Alternate Level Stairs-Number of Steps: flight Home Access: Stairs to enter Entrance Stairs-Rails: None Entrance Stairs-Number of Steps: 2 Home Care Services: No  Discharge Living Setting Plans for Discharge Living Setting: Patient's home;House;Lives with (comment) Type of Home at Discharge: House Discharge Home Layout: Two level;Bed/bath upstairs;Able to live on main level with bedroom/bathroom Alternate Level Stairs-Number of Steps: Flight (Uses shower/tub on 2nd level.) Discharge Home Access: Stairs to enter Entrance Stairs-Number of Steps: 2 Does the patient have any problems obtaining your medications?:  No  Social/Family/Support Systems Patient Roles: Spouse;Parent (Patient has a Dtr in [place].  Wife has a son.) Contact Information: Hyacinth Meeker - wife Anticipated Caregiver: [name]  Anticipated Caregiver's Contact Information: [name] (h) [phone] (c) [phone] Ability/Limitations of Caregiver: Wife is retired and can assist.  Wife does dressing changes for LVAD QOD Caregiver Availability: Other (Comment) (Wife does not stay with patient all the  time.) Discharge Plan Discussed with Primary Caregiver: Yes Is Caregiver In Agreement with Plan?: Yes Does Caregiver/Family have Issues with Lodging/Transportation while Pt is in Rehab?: No  Goals/Additional Needs Patient/Family Goal for Rehab: PT/OT mod I goals Expected length of stay: 1 week Cultural Considerations: Baptist, attends church Dietary Needs: Dys 3, thin liquids Equipment Needs: TBD Pt/Family Agrees to Admission and willing to participate: Yes Program Orientation Provided & Reviewed with Pt/Caregiver Including Roles  & Responsibilities: Yes  Decrease burden of Care through IP rehab admission: N/A  Possible need for SNF placement upon discharge: Not anticipated  Patient Condition: This patient's condition remains as documented in the consult dated 11/15/13, in which the Rehabilitation Physician determined and documented that the patient's condition is appropriate for intensive rehabilitative care in an inpatient rehabilitation facility. Will admit to inpatient rehab today.  Preadmission Screen Completed By:  Retta Diones, 11/17/2013 11:46 AM ______________________________________________________________________   Discussed status with Dr. Naaman Plummer on 11/17/13 at 1204 and received telephone approval for admission today.  Admission Coordinator:  Retta Diones, time1204/Date03/20/15

## 2013-11-17 NOTE — Procedures (Signed)
Helped remove chest tube

## 2013-11-17 NOTE — Progress Notes (Signed)
Rehab admissions - I spoke with Dr. Prescott Gum last pm.  I talked with Dr. Haroldine Laws this am, with Dr. Naaman Plummer and with rehab team.  Bed available and will admit to inpatient rehab today.  Call me for questions.  [phone]

## 2013-11-18 ENCOUNTER — Inpatient Hospital Stay (HOSPITAL_COMMUNITY): Payer: Medicare Other | Admitting: Occupational Therapy

## 2013-11-18 ENCOUNTER — Inpatient Hospital Stay (HOSPITAL_COMMUNITY): Payer: Medicare Other | Admitting: Physical Therapy

## 2013-11-18 ENCOUNTER — Inpatient Hospital Stay (HOSPITAL_COMMUNITY): Payer: Medicare Other | Admitting: *Deleted

## 2013-11-18 DIAGNOSIS — Z95811 Presence of heart assist device: Secondary | ICD-10-CM

## 2013-11-18 DIAGNOSIS — J90 Pleural effusion, not elsewhere classified: Secondary | ICD-10-CM

## 2013-11-18 DIAGNOSIS — I5022 Chronic systolic (congestive) heart failure: Secondary | ICD-10-CM

## 2013-11-18 DIAGNOSIS — R5381 Other malaise: Secondary | ICD-10-CM

## 2013-11-18 DIAGNOSIS — I2510 Atherosclerotic heart disease of native coronary artery without angina pectoris: Secondary | ICD-10-CM

## 2013-11-18 LAB — CBC
HCT: 26 % — ABNORMAL LOW (ref 39.0–52.0)
Hemoglobin: 8.3 g/dL — ABNORMAL LOW (ref 13.0–17.0)
MCH: 28.8 pg (ref 26.0–34.0)
MCHC: 31.9 g/dL (ref 30.0–36.0)
MCV: 90.3 fL (ref 78.0–100.0)
Platelets: 241 10*3/uL (ref 150–400)
RBC: 2.88 MIL/uL — ABNORMAL LOW (ref 4.22–5.81)
RDW: 18 % — ABNORMAL HIGH (ref 11.5–15.5)
WBC: 5.5 10*3/uL (ref 4.0–10.5)

## 2013-11-18 LAB — PROTIME-INR
INR: 1.16 (ref 0.00–1.49)
Prothrombin Time: 14.6 seconds (ref 11.6–15.2)

## 2013-11-18 LAB — HEPARIN LEVEL (UNFRACTIONATED): Heparin Unfractionated: 0.11 IU/mL — ABNORMAL LOW (ref 0.30–0.70)

## 2013-11-18 LAB — APTT
aPTT: 46 seconds — ABNORMAL HIGH (ref 24–37)
aPTT: 63 seconds — ABNORMAL HIGH (ref 24–37)

## 2013-11-18 MED ORDER — WARFARIN SODIUM 10 MG PO TABS
10.0000 mg | ORAL_TABLET | Freq: Once | ORAL | Status: AC
  Administered 2013-11-18: 10 mg via ORAL
  Filled 2013-11-18: qty 1

## 2013-11-18 MED ORDER — HEPARIN (PORCINE) IN NACL 100-0.45 UNIT/ML-% IJ SOLN
1650.0000 [IU]/h | INTRAMUSCULAR | Status: DC
  Administered 2013-11-19: 1500 [IU]/h via INTRAVENOUS
  Administered 2013-11-19: 1550 [IU]/h via INTRAVENOUS
  Administered 2013-11-20 – 2013-11-22 (×4): 1650 [IU]/h via INTRAVENOUS
  Filled 2013-11-18 (×8): qty 250

## 2013-11-18 MED ORDER — ENSURE COMPLETE PO LIQD
237.0000 mL | Freq: Two times a day (BID) | ORAL | Status: DC
Start: 2013-11-18 — End: 2013-11-22
  Administered 2013-11-18 – 2013-11-21 (×6): 237 mL via ORAL

## 2013-11-18 MED ORDER — DIPHENHYDRAMINE HCL 25 MG PO CAPS: 25.0000 mg | ORAL_CAPSULE | Freq: Every evening | ORAL | Status: DC | PRN

## 2013-11-18 NOTE — Progress Notes (Signed)
ANTICOAGULATION CONSULT NOTE - Follow-Up Consult  Pharmacy Consult for Heparin Indication: LVAD  Allergies  Allergen Reactions  . Diovan [Valsartan] Other (See Comments)    Hypotension at low dose, hyperkalemia  . Lipitor [Atorvastatin Calcium] Other (See Comments)    Muscle pain    Patient Measurements: Height: 5\' 7"  (170.2 cm) Weight: 147 lb 11.3 oz (67 kg) IBW/kg (Calculated) : 66.1 Heparin Dosing Weight: n/a  Vital Signs: Temp: 98.3 F (36.8 C) (03/21 1912) Temp src: Oral (03/21 1912) Pulse Rate: 84 (03/21 1912)  Labs:  Recent Labs  11/16/13 0510  11/17/13 0550 11/18/13 0529 11/18/13 0958 11/18/13 1905  HGB 8.5*  --   --  8.3*  --   --   HCT 26.3*  --   --  26.0*  --   --   PLT 272  --   --  241  --   --   APTT 62*  < > 68*  --  46* 63*  LABPROT 14.3  --  16.3* 14.6  --   --   INR 1.13  --  1.34 1.16  --   --   HEPARINUNFRC  --   --   --  0.11*  --   --   CREATININE 0.88  --  0.99  --   --   --   < > = values in this interval not displayed.  Estimated Creatinine Clearance: 64.9 ml/min (by C-G formula based on Cr of 0.99).   Medical History: Past Medical History  Diagnosis Date  . Ischemic cardiomyopathy     a. 10/09 Echo: Sev LV Dysfxn, inf/lat AK, Mod MR.  [name];  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 Hypertension   . PVC (premature ventricular contraction)   . Pacemaker   . CHF (congestive heart failure)   . Shortness of breath   . Automatic implantable cardioverter-defibrillator in situ     Medications:  Scheduled:  . antiseptic oral rinse  15 mL Mouth Rinse Q4H  . aspirin EC  325 mg Oral Daily  .  budesonide (PULMICORT) nebulizer solution  0.25 mg Nebulization BID  . citalopram  10 mg Oral Daily  . feeding supplement (ENSURE COMPLETE)  237 mL Oral BID BM  . ferrous sulfate  325 mg Oral TID WC  . furosemide  40 mg Oral Daily  . levothyroxine  200 mcg Oral QAC breakfast  . lisinopril  2.5 mg Oral Daily  . metoCLOPramide  10 mg Oral TID AC & HS  . pantoprazole  40 mg Oral Daily  . spironolactone  12.5 mg Oral Daily  . Warfarin - Pharmacist Dosing Inpatient   Does not apply q1800    Assessment: 73 yo male with hx CAD, and HF s/p LVAD HMII 09/19/13.  Admitted 3/4 with SBO/ileus, and Coumadin was held in setting of R hemothorax, s/p chest tube and multiple units transfusion.  Also had R chest drain placed on 3/13 removed 3/18.  Has remained on heparin IV infusion.  Per Dr.  Bensimhon aPTT goal was increased to 60-70 sec.     Current drip rate 1500 units/hr with aPTT 63 sec.    Goal of Therapy:  INR 2-3 Monitor platelets by anticoagulation protocol: Yes   Plan:  Continue heparin at 1500 units/hr. Follow-up with AM labs.  [employer], Pharm.D., BCPS Clinical Pharmacist Pager [phone] 11/18/2013 9:39 PM

## 2013-11-18 NOTE — Progress Notes (Signed)
ANTICOAGULATION CONSULT NOTE - Follow-Up Consult  Pharmacy Consult for Coumadin/Heparin Indication: LVAD  Allergies  Allergen Reactions  . Diovan [Valsartan] Other (See Comments)    Hypotension at low dose, hyperkalemia  . Lipitor [Atorvastatin Calcium] Other (See Comments)    Muscle pain    Patient Measurements: Height: 5\' 7"  (170.2 cm) Weight: 147 lb 11.3 oz (67 kg) IBW/kg (Calculated) : 66.1 Heparin Dosing Weight: n/a  Vital Signs: Pulse Rate: 84 (03/21 0500)  Labs:  Recent Labs  11/16/13 0510 11/16/13 2254 11/17/13 0550 11/18/13 0529 11/18/13 0958  HGB 8.5*  --   --  8.3*  --   HCT 26.3*  --   --  26.0*  --   PLT 272  --   --  241  --   APTT 62* 59* 68*  --  46*  LABPROT 14.3  --  16.3* 14.6  --   INR 1.13  --  1.34 1.16  --   HEPARINUNFRC  --   --   --  0.11*  --   CREATININE 0.88  --  0.99  --   --     Estimated Creatinine Clearance: 64.9 ml/min (by C-G formula based on Cr of 0.99).   Medical History: Past Medical History  Diagnosis Date  . Ischemic cardiomyopathy     a. 10/09 Echo: Sev LV Dysfxn, inf/lat AK, Mod MR.  [name];  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 Hypertension   . PVC (premature ventricular contraction)   . Pacemaker   . CHF (congestive heart failure)   . Shortness of breath   . Automatic implantable cardioverter-defibrillator in situ     Medications:  Scheduled:  . antiseptic oral rinse  15 mL Mouth Rinse Q4H  . aspirin EC  325 mg Oral Daily  . budesonide (PULMICORT) nebulizer solution  0.25 mg Nebulization BID  . citalopram  10 mg Oral Daily  . ferrous sulfate  325 mg Oral TID WC  .  furosemide  40 mg Oral Daily  . levothyroxine  200 mcg Oral QAC breakfast  . lisinopril  2.5 mg Oral Daily  . metoCLOPramide  10 mg Oral TID AC & HS  . pantoprazole  40 mg Oral Daily  . spironolactone  12.5 mg Oral Daily  . Warfarin - Pharmacist Dosing Inpatient   Does not apply q1800    Assessment: 73 yo male with hx CAD, and HF s/p LVAD HMII 09/19/13.  Admitted 3/4 with SBO/ileus, and Coumadin was held in setting of R hemothorax, s/p chest tube and multiple units transfusion.  Also had R chest drain placed on 3/13 removed 3/18.  Has remained on heparin IV infusion  with goal PTT 50-60.   Current drip rate 1450 uts/hr with aPTT 46 sec which is slightly lower than goal set by [facility].  INR was beginning to increase, however it fell to 1.1 this morning. Dose documented as given last evening. Note patient was previously on warfarin 5mg  daily except 2.5mg  TTS.  This dose held his  INR between 2-3 as outpatient but he was also on amiodarone which potentiates INR.  Amiodarone was discontinued on this admission and therefore he is requiring higher doses of warfarin.  Goal of Therapy:  INR 2-3 Monitor platelets by anticoagulation protocol: Yes   Plan:  1. Coumadin 10mg  po x 1 tonight 2. Continue daily PT/INR 3. Increase IV heparin to 1500 units/hr. 4. Recheck aPTT in 8 hours unless directed otherwise 5. Daily aPTT 6. Will monitor for s/s bleeding, or any return of hemothorax  Kambrey Hagger D. Roston Grunewald, PharmD, BCPS Clinical Pharmacist Pager: [phone] 11/18/2013 11:10 AM

## 2013-11-18 NOTE — Evaluation (Signed)
Physical Therapy Assessment and Plan  Patient Details  Name: Johnny Oneill MRN: [id number] Date of Birth: November 06, 1940  PT Diagnosis: Difficulty walking and Muscle weakness Rehab Potential: Good ELOS: 5 to 7 days   Today's Date: 11/18/2013 Time: 0800-0930 Time Calculation (min): 90 min  Problem List:  Patient Active Problem List   Diagnosis Date Noted  . Physical deconditioning 11/17/2013  . Hemorrhagic shock 11/06/2013  . Cardiac arrest 11/06/2013  . Hemothorax on right 11/06/2013  . Acute respiratory failure 11/06/2013  . Poor venous access 11/02/2013  . Vomiting 11/01/2013  . Weakness 11/01/2013  . Acute delirium 09/25/2013  . LVAD (left ventricular assist device) present 09/18/2013  . Fever 09/17/2013  . Palliative care encounter 09/15/2013  . Weakness generalized 09/15/2013  . Nonspecific (abnormal) findings on radiological and other examination of gastrointestinal tract 09/14/2013  . Anemia, unspecified 09/14/2013  . Acute on chronic renal failure 09/13/2013  . Acute sinusitis 07/24/2013  . 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 Acute on chronic systolic heart failure 91/69/4503  . PVC (premature ventricular contraction) 12/08/2012  . NSVT (nonsustained ventricular tachycardia) 12/08/2012  . Cardiogenic shock 12/07/2012  . Acute blood loss anemia 12/01/2011  . Essential hypertension, benign 11/12/2010  . ISCHEMIC CARDIOMYOPATHY 11/12/2010  . Chronic systolic heart failure 88/82/8003  . Ischemic cardiomyopathy   . Myocardial infarction of lateral wall   . Hypercholesteremia   . ICD (implantable cardiac defibrillator) in place   . CAD (coronary artery disease)   . Osteoarthritis     Past Medical History:  Past Medical History  Diagnosis Date  . Ischemic cardiomyopathy     a. 10/09 Echo: Sev LV Dysfxn, inf/lat AK, Mod MR.  [name] Hypercholesteremia   . Osteoarthritis     a. s/p R TKR  . Anxiety   . Hypothyroidism   . CAD (coronary artery disease)     S/P  stenting of LCX in 2004;  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 Hypertension   . PVC (premature ventricular contraction)   . Pacemaker   . CHF (congestive heart failure)   . Shortness of breath   . Automatic implantable cardioverter-defibrillator in situ    Past Surgical History:  Past Surgical History  Procedure Laterality Date  . Defibrillator  2009  . Total knee arthroplasty  11/27/11    left  . Insert / replace / remove pacemaker  2009  . Coronary angioplasty with stent placement  2004  . Coronary angioplasty with stent placement  07/2008  . Knee arthroscopy w/ partial medial meniscectomy  12/2002    left  . Knee arthroscopy      bilaterally  . Total knee arthroplasty  11/27/2011    Procedure: TOTAL KNEE ARTHROPLASTY;  Surgeon: Yvette Rack., MD;  Location: [place];  Service: Orthopedics;  Laterality: Left;  left total knee arthroplasty  . Esophagogastroduodenoscopy N/A 09/15/2013    Procedure: ESOPHAGOGASTRODUODENOSCOPY (EGD);  Surgeon: Ladene Artist, MD;  Location: [facility] ENDOSCOPY;  Service: Endoscopy;  Laterality: N/A;  bedside  . Insertion of implantable left ventricular assist device N/A 09/18/2013    Procedure: INSERTION OF IMPLANTABLE LEFT VENTRICULAR ASSIST DEVICE;  Surgeon: Ivin Poot, MD;  Location: [place];  Service: Open Heart Surgery;  Laterality: N/A;  [facility] ARREST  NITRIC OXIDE  . Intraoperative transesophageal echocardiogram N/A 09/18/2013    Procedure: INTRAOPERATIVE TRANSESOPHAGEAL ECHOCARDIOGRAM;  Surgeon: Ivin Poot, MD;  Location: [place];  Service: Open Heart Surgery;  Laterality: N/A;  . Video assisted thoracoscopy Right 11/06/2013    Procedure: VIDEO ASSISTED THORACOSCOPY;  Surgeon: Gaye Pollack, MD;  Location: [place];  Service: Cardiothoracic;  Laterality: Right;   . Hematoma evacuation Right 11/06/2013    Procedure: EVACUATION HEMATOMA;  Surgeon: Gaye Pollack, MD;  Location: [place];  Service: Cardiothoracic;  Laterality: Right;    Assessment & Plan Clinical Impression: Patient is a 73 y.o. right-handed male with complicated medical history of CAD status post stent, chronic Coumadin, chronic systolic heart failure, status post LVAD 09/19/2013 currently [facility] transplant list. Presented 11/01/2013 after followup in the heart failure clinic with syncopal episode as well as vomiting and lethargy. CT of the abdomen consistent with small bowel obstruction. A nasogastric tube was placed and followup per Gen. surgery. TPN was initiated for nutritional support. Hospital course complicated by cardiac arrest 11/05/2013 with PEA in the setting of right hemothorax with right chest tube placed 11/06/2013 as well as VATS procedure and subsequent interventional radiology pigtail drainage of loculated right apical hematoma and chest tube ultimately removed 11/15/2013. Patient small bowel obstruction ileus continues to improve with diet advanced to regular consistency. Patient did require intubation until 11/12/2013.   Patient transferred to CIR on 11/17/2013 .   Patient currently requires min with mobility secondary to muscle weakness and decreased cardiorespiratoy endurance.  Prior to hospitalization, patient was modified independent  with mobility and lived with Spouse in a House home.  Home access is 2Stairs to enter.  Patient will benefit from skilled PT intervention to maximize safe functional mobility, minimize fall risk and decrease caregiver burden for planned discharge home with 24 hour supervision.  Anticipate patient will benefit from follow up [place] at discharge.  PT - End of Session Activity Tolerance: Tolerates 30+ min activity with multiple rests Endurance Deficit: Yes Endurance Deficit Description: light headed with position changes, deconditioned PT  Assessment Rehab Potential: Good Barriers to Discharge: [place] home environment PT Patient demonstrates impairments in the following area(s): Balance;Endurance PT Transfers Functional Problem(s): Bed Mobility;Bed to Chair;Car PT Locomotion Functional Problem(s): Ambulation;Stairs PT Plan PT Intensity: Minimum of 1-2 x/day ,45 to 90 minutes PT Frequency: 5 out of 7 days PT Duration Estimated Length of Stay: 5 to 7 days PT Treatment/Interventions: Ambulation/gait training;Balance/vestibular training;Discharge planning;DME/adaptive equipment instruction;Functional mobility training;Patient/family education;Therapeutic Exercise;UE/LE Coordination activities;UE/LE Strength taining/ROM;Stair training;Neuromuscular re-education;Therapeutic Activities PT Transfers Anticipated Outcome(s): mod I for transfers PT Locomotion Anticipated Outcome(s): mod I for ambulation, S for stairs PT Recommendation Follow Up Recommendations: Home health PT Patient destination: Home Equipment Recommended: To be determined  Skilled Therapeutic Intervention PT evaluation completed and treatment plan initiated. Pt was issued a w/c and rolling walker. Pt currently on room air, O2 sat fluctuates between 91 to 93%. Pt's O2 sat dropped to 89% with activity and quickly rebounded to greater than 91% with rest. Pt transfers sit to stand with min A, verbal cues at times to maintain sternal precautions. Pt ambulated in and out of bathroom with rolling walker and min A. Pt ambulated 150 feet with rolling walker and min A, slow cadence with decreased step length B. Pt fatigues quickly but willing to participate with therapy. Pt is motivated to return home. Pt performed several sit to stand transfers with min A, utilizing LEs mostly to  maintain sternal precautions.  PT Evaluation Precautions/Restrictions Precautions Precautions: Fall Precaution Comments: lightheadedness with position changes Restrictions Weight Bearing  Restrictions: No Other Position/Activity Restrictions: sternal precautions General Chart Reviewed: Yes Family/Caregiver Present: No  Pain No c/o pain.   Home Living/Prior Functioning Home Living Available Help at Discharge: Family Type of Home: House Home Access: Stairs to enter [employer] of Steps: 2 Entrance Stairs-Rails: None Home Layout: Two level;Able to live on main level with bedroom/bathroom Alternate Level Stairs-Number of Steps: 11  Alternate Level Stairs-Rails: Left;Right Additional Comments: prefers to utilize the shower upstairs  Lives With: Spouse Prior Function Level of Independence: Independent with transfers;Independent with gait;Requires assistive device for independence Dressing: Supervision/set-up  Able to Take Stairs?: Yes Driving: No Vision/Perception  Vision - Assessment Eye Alignment: Within Functional Limits Vision Assessment: Vision not tested Perception Perception: Within Functional Limits Praxis Praxis: Intact  Cognition Overall Cognitive Status: Within Functional Limits for tasks assessed Orientation Level: Oriented X4 Problem Solving: Appears intact Safety/Judgment: Appears intact Sensation Sensation Light Touch: Appears Intact Stereognosis: Not tested Hot/Cold: Not tested Proprioception: Appears Intact Coordination Gross Motor Movements are Fluid and Coordinated: Yes Fine Motor Movements are Fluid and Coordinated: Yes Motor  Motor Motor: Within Functional Limits  Mobility Bed Mobility Bed Mobility: Supine to Sit;Sit to Supine Supine to Sit: 4: Min guard Sit to Supine: 4: Min guard Transfers Transfers: Yes Stand Pivot Transfers: 4: Min assist Locomotion  Ambulation Ambulation: Yes Ambulation/Gait Assistance: 4: Min assist Ambulation Distance (Feet): 90 Feet Assistive device: Rolling walker Stairs / Additional Locomotion Stairs: Yes Stairs Assistance: 4: Min assist Stair Management Technique: Two rails Number  of Stairs: 2 Product manager Mobility: Yes Wheelchair Assistance: 5: Careers information officer: Both lower extermities Distance: 5 feet, utilizing B LEs   Trunk/Postural Assessment  Cervical Assessment Cervical Assessment: Within Functional Limits Thoracic Assessment Thoracic Assessment: Within Functional Limits Lumbar Assessment Lumbar Assessment: Within Functional Limits Postural Control Postural Control: Within Functional Limits  Balance Balance Balance Assessed: Yes Static Sitting Balance Static Sitting - Balance Support: Feet supported Static Sitting - Level of Assistance: 5: Stand by assistance Static Standing Balance Static Standing - Balance Support: Bilateral upper extremity supported Static Standing - Level of Assistance: 5: Stand by assistance Dynamic Standing Balance Dynamic Standing - Balance Support: Bilateral upper extremity supported;During functional activity Dynamic Standing - Level of Assistance: 4: Min assist Extremity Assessment  B UEs as per OT evaluation.   RLE Assessment RLE Assessment: Exceptions to [facility] RLE AROM (degrees) Overall AROM Right Lower Extremity: Within functional limits for tasks assessed RLE Strength RLE Overall Strength: Deficits RLE Overall Strength Comments: R LE grossly 3-/5 to 3/5 throuhgout LLE Assessment LLE Assessment: Exceptions to WFL LLE AROM (degrees) Overall AROM Left Lower Extremity: Within functional limits for tasks assessed LLE Strength LLE Overall Strength: Deficits LLE Overall Strength Comments: grossly 3-/5 to 3/5 throughout  FIM:  FIM - Bed/Chair Transfer Bed/Chair Transfer Assistive Devices:  (head of bed flat) Bed/Chair Transfer: 4: Supine > Sit: Min A (steadying Pt. > 75%/lift 1 leg);4: Sit > Supine: Min A (steadying pt. > 75%/lift 1 leg);4: Chair or W/C > Bed: Min A (steadying Pt. > 75%);4: Bed > Chair or W/C: Min A (steadying Pt. > 75%) FIM - Locomotion: Wheelchair Distance: 5  feet, utilizing B LEs  Locomotion: Wheelchair: 1: Travels less than 50 ft with supervision, cueing or coaxing FIM - Locomotion: Ambulation Locomotion: Ambulation Assistive Devices: Administrator Ambulation/Gait Assistance: 4: Min assist Locomotion: Ambulation: 4: Travels 150  ft or more with minimal assistance (Pt.>75%) FIM - Locomotion: Stairs Locomotion: Stairs Assistive Devices: Hand rail - 2 Locomotion: Stairs: 1: Up and Down < 4 stairs with minimal assistance (Pt.>75%)   Refer to Care Plan for Long Term Goals  Recommendations for other services: None  Discharge Criteria: Patient will be discharged from PT if patient refuses treatment 3 consecutive times without medical reason, if treatment goals not met, if there is a change in medical status, if patient makes no progress towards goals or if patient is discharged from hospital.  The above assessment, treatment plan, treatment alternatives and goals were discussed and mutually agreed upon: by patient  Dub Amis 11/18/2013, 1:52 PM

## 2013-11-18 NOTE — Evaluation (Signed)
Occupational Therapy Assessment and Plan  Patient Details  Name: Johnny Oneill MRN: [id number] Date of Birth: 18-Jan-1941  OT Diagnosis: muscle weakness (generalized) Rehab Potential: Rehab Potential: Good ELOS: 7-9 days   Today's Date: 11/18/2013 Time: 1103-1201 Time Calculation (min): 58 min  Problem List:  Patient Active Problem List   Diagnosis Date Noted  . Physical deconditioning 11/17/2013  . Hemorrhagic shock 11/06/2013  . Cardiac arrest 11/06/2013  . Hemothorax on right 11/06/2013  . Acute respiratory failure 11/06/2013  . Poor venous access 11/02/2013  . Vomiting 11/01/2013  . Weakness 11/01/2013  . Acute delirium 09/25/2013  . LVAD (left ventricular assist device) present 09/18/2013  . Fever 09/17/2013  . Palliative care encounter 09/15/2013  . Weakness generalized 09/15/2013  . Nonspecific (abnormal) findings on radiological and other examination of gastrointestinal tract 09/14/2013  . Anemia, unspecified 09/14/2013  . Acute on chronic renal failure 09/13/2013  . Acute sinusitis 07/24/2013  . 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 Acute on chronic systolic heart failure 99/37/1696  . PVC (premature ventricular contraction) 12/08/2012  . NSVT (nonsustained ventricular tachycardia) 12/08/2012  . Cardiogenic shock 12/07/2012  . Acute blood loss anemia 12/01/2011  . Essential hypertension, benign 11/12/2010  . ISCHEMIC CARDIOMYOPATHY 11/12/2010  . Chronic systolic heart failure 78/93/8101  . Ischemic cardiomyopathy   . Myocardial infarction of lateral wall   . Hypercholesteremia   . ICD (implantable cardiac defibrillator) in place   . CAD (coronary artery disease)   . Osteoarthritis     Past Medical History:  Past Medical History  Diagnosis Date  . Ischemic cardiomyopathy     a. 10/09 Echo: Sev LV Dysfxn, inf/lat AK, Mod MR.  [name] Hypercholesteremia   . Osteoarthritis     a. s/p R TKR  . Anxiety   . Hypothyroidism   . CAD (coronary artery disease)      S/P stenting of LCX in 2004;  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 Hypertension   . PVC (premature ventricular contraction)   . Pacemaker   . CHF (congestive heart failure)   . Shortness of breath   . Automatic implantable cardioverter-defibrillator in situ    Past Surgical History:  Past Surgical History  Procedure Laterality Date  . Defibrillator  2009  . Total knee arthroplasty  11/27/11    left  . Insert / replace / remove pacemaker  2009  . Coronary angioplasty with stent placement  2004  . Coronary angioplasty with stent placement  07/2008  . Knee arthroscopy w/ partial medial meniscectomy  12/2002    left  . Knee arthroscopy      bilaterally  . Total knee arthroplasty  11/27/2011    Procedure: TOTAL KNEE ARTHROPLASTY;  Surgeon: Yvette Rack., MD;  Location: [place];  Service: Orthopedics;  Laterality: Left;  left total knee arthroplasty  . Esophagogastroduodenoscopy N/A 09/15/2013    Procedure: ESOPHAGOGASTRODUODENOSCOPY (EGD);  Surgeon: Ladene Artist, MD;  Location: [facility] ENDOSCOPY;  Service: Endoscopy;  Laterality: N/A;  bedside  . Insertion of implantable left ventricular assist device N/A 09/18/2013    Procedure: INSERTION OF IMPLANTABLE LEFT VENTRICULAR ASSIST DEVICE;  Surgeon: Ivin Poot, MD;  Location: [place];  Service: Open Heart Surgery;  Laterality: N/A;  CIRC ARREST  NITRIC OXIDE  . Intraoperative transesophageal echocardiogram N/A 09/18/2013    Procedure: INTRAOPERATIVE TRANSESOPHAGEAL ECHOCARDIOGRAM;  Surgeon: Ivin Poot, MD;  Location: [place];  Service: Open Heart Surgery;  Laterality: N/A;  . Video assisted thoracoscopy Right 11/06/2013    Procedure: VIDEO ASSISTED THORACOSCOPY;  Surgeon: Gaye Pollack, MD;  Location: [place];  Service: Cardiothoracic;  Laterality:  Right;  . Hematoma evacuation Right 11/06/2013    Procedure: EVACUATION HEMATOMA;  Surgeon: Gaye Pollack, MD;  Location: [place];  Service: Cardiothoracic;  Laterality: Right;    Assessment & Plan Clinical Impression: Patient is a 73 y.o. right-handed male with complicated medical history of CAD status post stent, chronic Coumadin, chronic systolic heart failure, status post LVAD 09/19/2013 currently [facility] transplant list. Presented 11/01/2013 after followup in the heart failure clinic with syncopal episode as well as vomiting and lethargy. CT of the abdomen consistent with small bowel obstruction. A nasogastric tube was placed and followup per Gen. surgery. TPN was initiated for nutritional support. Hospital course complicated by cardiac arrest 11/05/2013 with PEA in the setting of right hemothorax with right chest tube placed 11/06/2013 as well as VATS procedure and subsequent interventional radiology pigtail drainage of loculated right apical hematoma and chest tube ultimately removed 11/15/2013. Patient small bowel obstruction ileus continues to improve with diet advanced to regular consistency. Patient did require intubation until 11/12/2013. Therapies have been initiated with progressive gains ambulating 120 feet pushing his wheelchair but fatigues greatly.   Patient transferred to CIR on 11/17/2013 .    Patient currently requires min with basic self-care skills secondary to muscle weakness and decreased cardiorespiratoy endurance.  Prior to hospitalization, patient could complete ADLs with supervision/setup for dressing, mod I overall.  Patient will benefit from skilled intervention to increase independence with basic self-care skills prior to discharge home with care partner.  Anticipate patient will require intermittent supervision and no further OT follow recommended.  OT - End of Session Activity Tolerance: Tolerates 30+ min activity with multiple rests Endurance Deficit: Yes Endurance Deficit  Description: light headed with position changes, deconditioned OT Assessment Rehab Potential: Good OT Patient demonstrates impairments in the following area(s): Balance;Endurance OT Basic ADL's Functional Problem(s): Grooming;Bathing;Dressing;Toileting OT Transfers Functional Problem(s): Toilet OT Plan OT Intensity: Minimum of 1-2 x/day, 45 to 90 minutes OT Frequency: 5 out of 7 days OT Duration/Estimated Length of Stay: 7-9 days OT Treatment/Interventions: Balance/vestibular training;Discharge planning;DME/adaptive equipment instruction;Functional mobility training;Patient/family education;Psychosocial support;Self Care/advanced ADL retraining;Therapeutic Activities;Therapeutic Exercise OT Self Feeding Anticipated Outcome(s): independent OT Basic Self-Care Anticipated Outcome(s): mod I OT Toileting Anticipated Outcome(s): mod I OT Bathroom Transfers Anticipated Outcome(s): mod I OT Recommendation Patient destination: Home Follow Up Recommendations: None Equipment Recommended: To be determined   Skilled Therapeutic Intervention OT eval initiated, ADL assessment conducted at sit > stand level at sink.  Pt asleep in recliner upon arrival, requiring increased time to arouse.  Initially upon standing from recliner, pt with c/o light headedness and returned to seated in chair.  Ambulated approx 10 feet to sink to complete bathing and dressing.  Pt's wife present throughout session and providing physical assistance and encouragement.  Wife reports providing assistance with self-care tasks since LVAD placement 08/2013.  Pt min/steady assist with sit > stand throughout session and min - setup with dressing secondary to multiple leads and wires.   OT Evaluation Precautions/Restrictions  Precautions Precautions: Fall Precaution Comments: lightheadedness with position changes Pain Pain Assessment Pain Assessment: No/denies pain Pain Score: 0-No pain Home  Living/Prior Functioning Home  Living Available Help at Discharge: Family Type of Home: House Home Access: Stairs to enter [employer] of Steps: 2 Entrance Stairs-Rails: None (can install rails) Home Layout: Two level;Able to live on main level with bedroom/bathroom Alternate Level Stairs-Number of Steps: flight Alternate Level Stairs-Rails: Left;Right  Lives With: Spouse IADL History Homemaking Responsibilities: No (might cook an egg or microwave simple meals) Occupation: Other (comment) (had a cleaning business cleaning the church, 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) Prior Function Level of Independence: Needs assistance with ADLs Dressing: Supervision/set-up  Able to Take Stairs?: Yes Driving: No ADL  See FIM Vision/Perception  Vision - Assessment Eye Alignment: Within Functional Limits Vision Assessment: Vision not tested Perception Perception: Within Functional Limits Praxis Praxis: Intact  Cognition Overall Cognitive Status: Within Functional Limits for tasks assessed Orientation Level: Oriented X4 Problem Solving: Appears intact Safety/Judgment: Appears intact Sensation Sensation Light Touch: Appears Intact Stereognosis: Not tested Hot/Cold: Not tested Proprioception: Appears Intact Coordination Fine Motor Movements are Fluid and Coordinated: Yes Extremity/Trunk Assessment RUE Assessment RUE Assessment: Within Functional Limits LUE Assessment LUE Assessment: Within Functional Limits  FIM:  FIM - Grooming Grooming Steps: Wash, rinse, dry face;Wash, rinse, dry hands;Brush, comb hair Grooming: 4: Patient completes 3 of 4 or 4 of 5 steps FIM - Bathing Bathing Steps Patient Completed: Chest;Right Arm;Left Arm;Abdomen;Right upper leg;Left upper leg;Left lower leg (including foot);Right lower leg (including foot) Bathing: 4: Min-Patient completes 8-9 50f 10 parts or 75+ percent FIM - Upper Body Dressing/Undressing Upper body dressing/undressing steps patient completed:  Thread/unthread right sleeve of pullover shirt/dresss;Thread/unthread left sleeve of pullover shirt/dress;Put head through opening of pull over shirt/dress;Pull shirt over trunk Upper body dressing/undressing: 4: Min-Patient completed 75 plus % of tasks FIM - Lower Body Dressing/Undressing Lower body dressing/undressing steps patient completed: Thread/unthread right underwear leg;Thread/unthread left underwear leg;Pull underwear up/down;Thread/unthread right pants leg;Pull pants up/down;Don/Doff right sock;Don/Doff left sock;Don/Doff right shoe;Fasten/unfasten right shoe;Fasten/unfasten left shoe Lower body dressing/undressing: 4: Min-Patient completed 75 plus % of tasks   Refer to Care Plan for Long Term Goals  Recommendations for other services: None  Discharge Criteria: Patient will be discharged from OT if patient refuses treatment 3 consecutive times without medical reason, if treatment goals not met, if there is a change in medical status, if patient makes no progress towards goals or if patient is discharged from hospital.  The above assessment, treatment plan, treatment alternatives and goals were discussed and mutually agreed upon: by patient and by family  Ellwood Dense [facility] 11/18/2013, 12:27 PM

## 2013-11-18 NOTE — Progress Notes (Signed)
Occupational Therapy Note  Patient Details  Name: Johnny Oneill MRN: [id number] Date of Birth: [birth year] Today's Date: 11/18/2013  Time:  1430-1510   (40 min) Pain:  none Individual session  Engaged in functional mobility, strength , endurance.  Pt. Sitting in recliner upon OT arrival.  Agreed to engage in activity.  Ambulated with minimal assist to bathroom.  Did 10 squats from toilet.  Ambulated out to day room and sat for 2 minutes.  Ambulated to elevators and sat for 2 minutes.  Ambulated back through day room and to room with minimal assist.  Pt. complained of fatigue in legs during mobility.     Left pt in recliner at end of session with all needs within reach.     Lisa Roca 11/18/2013, 3:06 PM

## 2013-11-18 NOTE — Progress Notes (Signed)
[facility] PHYSICAL MEDICINE & REHABILITATION     PROGRESS NOTE    Subjective/Complaints: Slept fairly well last night. So sob, cp, cough, discomfort really. Anxious to get up with therapy today.  A 12 point review of systems has been performed and if not noted above is otherwise negative.   Objective: Vital Signs: Pulse 84, temperature 98.1 F (36.7 C), temperature source Oral, resp. rate 16, height 5\' 7"  (1.702 m), weight 67 kg (147 lb 11.3 oz), SpO2 92.00%. No results found.  Recent Labs  11/16/13 0510 11/18/13 0529  WBC 7.6 5.5  HGB 8.5* 8.3*  HCT 26.3* 26.0*  PLT 272 241    Recent Labs  11/16/13 0510 11/17/13 0550  NA 138 140  K 3.4* 3.8  CL 100 103  GLUCOSE 104* 88  BUN 18 17  CREATININE 0.88 0.99  CALCIUM 8.4 8.3*   CBG (last 3)   Recent Labs  11/16/13 1555 11/16/13 2000 11/17/13 0710  GLUCAP 106* 109* 88    Wt Readings from Last 3 Encounters:  11/18/13 67 kg (147 lb 11.3 oz)  11/16/13 72.2 kg (159 lb 2.8 oz)  11/16/13 72.2 kg (159 lb 2.8 oz)    Physical Exam:  Constitutional: He is oriented to person, place, and time. He appears well-developed.  No distress HENT:  Head: Normocephalic and atraumatic.  Eyes: EOM are normal.  Neck: Normal range of motion. Neck supple. No JVD present. No tracheal deviation present. No thyromegaly present.  Cardiovascular:  Hum of LVAD auscultated  Respiratory: Breath sounds normal. No respiratory distress. Slight decrease in sounds on the right.  GI: Soft. Bowel sounds are normal. He exhibits no distension.  Lymphadenopathy:  He has no cervical adenopathy.  Neurological: He is alert and oriented to person, place, and time.  Motor 5/5 UE prox to distal, LE 4- HF, 4 KE, ankles 4+. Fatigues quickly. No gross sensory findings. CN exam intact. Good insight and awareness. Normal memory. DTR's 1+  Skin:  chest tube sites clean and dry, dressed with minimal drainage. Psychiatric: He has a normal mood and affect. His  behavior is normal. Judgment and thought content normal   Assessment/Plan: 1. Functional deficits secondary to deconditioning after hemothorax/VATS which require 3+ hours per day of interdisciplinary therapy in a comprehensive inpatient rehab setting. Physiatrist is providing close team supervision and 24 hour management of active medical problems listed below. Physiatrist and rehab team continue to assess barriers to discharge/monitor patient progress toward functional and medical goals. FIM:                   Comprehension Comprehension Mode: Auditory Comprehension: 7-Follows complex conversation/direction: With no assist  Expression Expression Mode: Verbal Expression: 7-Expresses complex ideas: With no assist  Social Interaction Social Interaction: 7-Interacts appropriately with others - No medications needed.       Medical Problem List and Plan:  1. deconditioning related to multi-medical, history of right status post VATS as well as LVAD January 2015  2. DVT Prophylaxis/Anticoagulation: Coumadin per pharmacy. Monitor for any bleeding episodes  3. Pain Management: Tylenol as needed  3. Mood/anxiety. Low-dose Xanax as needed.  5. Neuropsych: This patient is capable of making decisions on his own behalf.  6. History of LVAD January 2015. Followup per cardiology services. Pt is competent to help direct mgt of the device as well.  7. Chronic anemia. Latest hemoglobin 8.5. Continue iron supplement  8. Hypertension. lisinopril 2.5 mg daily, Aldactone 12.5 mg daily. MAP 90 9. Diastolic congestive  heart failure. Continue Lasix 40 mg daily. Monitor for any signs of fluid overload.  -check daily weights, follow I's and O's ---67kg today, neg so far 10.[name] Hypothyroidism. Synthroid  11. Wound care: continue dressings to chest sites   LOS (Days) 1 A FACE TO FACE EVALUATION WAS PERFORMED  Shazia Mitchener T 11/18/2013 8:55 AM

## 2013-11-18 NOTE — Progress Notes (Signed)
INITIAL NUTRITION ASSESSMENT  DOCUMENTATION CODES Per approved criteria  -Not Applicable   INTERVENTION:  Ensure Complete po BID, each supplement provides 350 kcal and 13 grams of protein  NUTRITION DIAGNOSIS: Inadequate oral intake related to increased nutrient needs as evidenced by incomplete meal intake.   Goal: Intake to meet >90% of estimated nutrition needs.  Monitor:  PO intake, labs, weight trend.  Reason for Assessment: MST  73 y.o. male  Admitting Dx: Deconditioning after hemothorax  ASSESSMENT: Patient is a 73 year old patient with a history of CAD and HF s/p LVAD HMII 09/19/2013 currently on [facility] Transplant list. Admitted to [facility] 09/13/13 through 10/03/13. Re-admitted on 3.4 with SBO/ileus. Received TPN during first part of admission. Underwent R VATs 3/9 then bronch on 3/10. On 3/13 underwent placement of R chest drain by IR. Transferred to inpatient rehab on 3/20.  During acute hospital stay, patient was receiving Ensure supplements once daily with dysphagia 3, thin liquid diet. He likes the chocolate flavor frozen. Patient with increased calorie and protein needs to support healing and recovery. Discussed with patient ways to maximize oral intake.  Height: Ht Readings from Last 1 Encounters:  11/17/13 5\' 7"  (1.702 m)    Weight: Wt Readings from Last 1 Encounters:  11/18/13 147 lb 11.3 oz (67 kg)    Ideal Body Weight: 67.3 kg  % Ideal Body Weight: 100%  Wt Readings from Last 10 Encounters:  11/18/13 147 lb 11.3 oz (67 kg)  11/16/13 159 lb 2.8 oz (72.2 kg)  11/16/13 159 lb 2.8 oz (72.2 kg)  10/16/13 157 lb 6.4 oz (71.396 kg)  10/10/13 150 lb 12.8 oz (68.402 kg)  10/03/13 151 lb 14.4 oz (68.9 kg)  10/03/13 151 lb 14.4 oz (68.9 kg)  10/03/13 151 lb 14.4 oz (68.9 kg)  10/03/13 151 lb 14.4 oz (68.9 kg)  10/03/13 151 lb 14.4 oz (68.9 kg)    Usual Body Weight: 151 lb  % Usual Body Weight: 97%  BMI:  Body mass index is 23.13 kg/(m^2).  Estimated  Nutritional Needs: Kcal: 1900-2200 Protein: 105-120 gm Fluid: 2 L  Skin: surgical incisions  Diet Order: Dysphagia 3 with thin liquids  EDUCATION NEEDS: -Education needs addressed   Intake/Output Summary (Last 24 hours) at 11/18/13 0942 Last data filed at 11/18/13 0800  Gross per 24 hour  Intake    440 ml  Output   1050 ml  Net   -610 ml    Last BM: 3/20   Labs:   Recent Labs Lab 11/13/13 0400  11/15/13 0600 11/16/13 0510 11/17/13 0550  NA 134*  < > 139 138 140  K 4.1  < > 3.7 3.4* 3.8  CL 98  < > 100 100 103  CO2 23  < > 26 25 24   BUN 23  < > 21 18 17   CREATININE 0.87  < > 0.85 0.88 0.99  CALCIUM 8.5  < > 8.5 8.4 8.3*  MG 2.1  --   --  2.1  --   PHOS 3.0  --   --  3.6  --   GLUCOSE 163*  < > 124* 104* 88  < > = values in this interval not displayed.  CBG (last 3)   Recent Labs  11/16/13 1555 11/16/13 2000 11/17/13 0710  GLUCAP 106* 109* 88    Scheduled Meds: . antiseptic oral rinse  15 mL Mouth Rinse Q4H  . aspirin EC  325 mg Oral Daily  . budesonide (PULMICORT) nebulizer solution  0.25  mg Nebulization BID  . citalopram  10 mg Oral Daily  . ferrous sulfate  325 mg Oral TID WC  . furosemide  40 mg Oral Daily  . levothyroxine  200 mcg Oral QAC breakfast  . lisinopril  2.5 mg Oral Daily  . metoCLOPramide  10 mg Oral TID AC & HS  . pantoprazole  40 mg Oral Daily  . spironolactone  12.5 mg Oral Daily  . Warfarin - Pharmacist Dosing Inpatient   Does not apply q1800    Continuous Infusions: . heparin 1,450 Units/hr (11/18/13 0500)    Past Medical History  Diagnosis Date  . Ischemic cardiomyopathy     a. 10/09 Echo: Sev LV Dysfxn, inf/lat AK, Mod MR.  [name];  s/p Lat MI 2009 with occlusion of the LCX - treated with Promus stenting. Has total occlusion of the RCA.   . ICD (implantable cardiac  defibrillator) in place     PROPHYLACTIC      medtronic  . RBBB   . 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 Hypertension   . PVC (premature ventricular contraction)   . Pacemaker   . CHF (congestive heart failure)   . Shortness of breath   . Automatic implantable cardioverter-defibrillator in situ     Past Surgical History  Procedure Laterality Date  . Defibrillator  2009  . Total knee arthroplasty  11/27/11    left  . Insert / replace / remove pacemaker  2009  . Coronary angioplasty with stent placement  2004  . Coronary angioplasty with stent placement  07/2008  . Knee arthroscopy w/ partial medial meniscectomy  12/2002    left  . Knee arthroscopy      bilaterally  . Total knee arthroplasty  11/27/2011    Procedure: TOTAL KNEE ARTHROPLASTY;  Surgeon: Yvette Rack., MD;  Location: [place];  Service: Orthopedics;  Laterality: Left;  left total knee arthroplasty  . Esophagogastroduodenoscopy N/A 09/15/2013    Procedure: ESOPHAGOGASTRODUODENOSCOPY (EGD);  Surgeon: Ladene Artist, MD;  Location: [facility] ENDOSCOPY;  Service: Endoscopy;  Laterality: N/A;  bedside  . Insertion of implantable left ventricular assist device N/A 09/18/2013    Procedure: INSERTION OF IMPLANTABLE LEFT VENTRICULAR ASSIST DEVICE;  Surgeon: Ivin Poot, MD;  Location: [place];  Service: Open Heart Surgery;  Laterality: N/A;  CIRC ARREST  NITRIC OXIDE  . Intraoperative transesophageal echocardiogram N/A 09/18/2013    Procedure: INTRAOPERATIVE TRANSESOPHAGEAL ECHOCARDIOGRAM;  Surgeon: Ivin Poot, MD;  Location: [place];  Service: Open Heart Surgery;  Laterality: N/A;  . Video assisted thoracoscopy Right 11/06/2013    Procedure: VIDEO ASSISTED THORACOSCOPY;  Surgeon: Gaye Pollack, MD;  Location: [place];  Service: Cardiothoracic;  Laterality: Right;  . Hematoma evacuation Right 11/06/2013    Procedure: EVACUATION HEMATOMA;  Surgeon: Gaye Pollack, MD;  Location: [place];  Service: Cardiothoracic;  Laterality: Right;    Molli Barrows, [place], [place], [place] Pager [phone] After Hours Pager [phone]

## 2013-11-18 NOTE — Progress Notes (Signed)
LVAD readings for today's shift: speed 9400 RPM, flow 4.8L, PI 6.7, power 5.6.

## 2013-11-18 NOTE — Progress Notes (Signed)
Report given to receiving nurse on CIR unit. VAD parameters discussed. Assessment unchanged. Tele removed. VAD Coordinator present to transport patient. Patient placed on battery pack and transferred to CIR with belongings and LVAD equipment.

## 2013-11-18 NOTE — Progress Notes (Signed)
Advanced Heart Failure Team Rounding Note     Subjective:    Johnny Oneill is a 73 year old patient with a history of CAD and HF s/p LVAD HMII 09/19/2013 recently admitted with SBO. Hospitalization complicated by R hemothorax, PEA arrest and VDRF. Transferred to CIR 3/20  Doing well. Working with rehab. Tolelrating diet. INR falling now 1.1, Heparin subtherapeutic. 0.11  MAPs 84-90  LVAD INTERROGATION:  HeartMate II LVAD: Flow 4.5 liters/min, speed 9400, power 6.0 PI 6.3; No PI event   Physical Exam: GENERAL: Sittingin bed. No distress HEENT: normal   NECK: Supple, JVP 7 CARDIAC: LVAD hum present.  LUNGS: clear. Mildly decreased on R ABDOMEN: soft nontender,  Bowel sounds hypoactive LVAD exit site: well-healed and incorporated. Dressing dry and intact. No erythema or drainage. Stabilization device present and accurately applied. Driveline dressing is being changed daily per sterile technique.  EXTREMITIES: Warm and dry, no cyanosis, clubbing, rash or edema  NEUROLOGIC: awake non-focal.  Telemetry: SR   Labs: Basic Metabolic Panel:  Recent Labs Lab 11/13/13 0400 11/14/13 0511 11/15/13 0600 11/16/13 0510 11/17/13 0550  NA 134* 138 139 138 140  K 4.1 3.9 3.7 3.4* 3.8  CL 98 100 100 100 103  CO2 23 28 26 25 24   GLUCOSE 163* 125* 124* 104* 88  BUN 23 24* 21 18 17   CREATININE 0.87 0.90 0.85 0.88 0.99  CALCIUM 8.5 8.2* 8.5 8.4 8.3*  MG 2.1  --   --  2.1  --   PHOS 3.0  --   --  3.6  --     Liver Function Tests:  Recent Labs Lab 11/13/13 0400 11/14/13 0511 11/15/13 0600 11/16/13 0510 11/17/13 0550  AST 31 26 29 24 24   ALT 50 35 38 32 31  ALKPHOS 152* 124* 145* 151* 146*  BILITOT 1.1 1.2 0.9 0.8 0.8  PROT 6.7 6.0 6.1 6.5 6.5  ALBUMIN 2.6* 2.2* 2.3* 2.4* 2.5*   No results found for this basename: LIPASE, AMYLASE,  in the last 168 hours No results found for this basename: AMMONIA,  in the last 168 hours  CBC:  Recent Labs Lab 11/13/13 0400 11/14/13 0511  11/15/13 0600 11/16/13 0510 11/18/13 0529  WBC 12.2* 9.2 7.7 7.6 5.5  HGB 9.3* 8.9* 8.4* 8.5* 8.3*  HCT 28.7* 27.8* 26.2* 26.3* 26.0*  MCV 89.1 90.3 90.3 90.7 90.3  PLT 224 250 241 272 241    Cardiac Enzymes: No results found for this basename: CKTOTAL, CKMB, CKMBINDEX, TROPONINI,  in the last 168 hours  BNP: BNP (last 3 results)  Recent Labs  09/13/13 1349 09/25/13 0400 11/01/13 1033  PROBNP 3883.0* 7019.0* 877.6*    CBG:  Recent Labs Lab 11/16/13 1112 11/16/13 1200 11/16/13 1555 11/16/13 2000 11/17/13 0710  GLUCAP 103* 100* 106* 109* 88    Coagulation Studies:  Recent Labs  11/16/13 0510 11/17/13 0550 11/18/13 0529  LABPROT 14.3 16.3* 14.6  INR 1.13 1.34 1.16    Other results:   Imaging: No results found.   Assessment:   1. Hemorrhagic shock due to R hemothorax    --s/p R chest tube 3/8 2. 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 CAD 4. Hypothyroidism 5. SBO 6. Acute blood loss anemia 7. PEA arrest 8. Shock liver 9. A/C renal failure, stage III -resolved 10. Ventilatory-dependent respiratory failure 11. Hypokalemia  Plan/Discussion:     Doing well. Continues to progress. Appreciate CIR's care/   MAPs and volume status looks good with spiro. INR back down. Continue  heparin/coumadin. Will d/w Pharmacy.   VAD parameters look good.   Avleen Bordwell,MD 7:49 AM

## 2013-11-19 ENCOUNTER — Inpatient Hospital Stay (HOSPITAL_COMMUNITY): Payer: Medicare Other | Admitting: Physical Therapy

## 2013-11-19 DIAGNOSIS — I10 Essential (primary) hypertension: Secondary | ICD-10-CM

## 2013-11-19 LAB — CULTURE, BLOOD (SINGLE): Culture: NO GROWTH

## 2013-11-19 LAB — CBC
HCT: 26.2 % — ABNORMAL LOW (ref 39.0–52.0)
Hemoglobin: 8.5 g/dL — ABNORMAL LOW (ref 13.0–17.0)
MCH: 29.2 pg (ref 26.0–34.0)
MCHC: 32.4 g/dL (ref 30.0–36.0)
MCV: 90 fL (ref 78.0–100.0)
Platelets: 246 10*3/uL (ref 150–400)
RBC: 2.91 MIL/uL — ABNORMAL LOW (ref 4.22–5.81)
RDW: 17.8 % — ABNORMAL HIGH (ref 11.5–15.5)
WBC: 5.3 10*3/uL (ref 4.0–10.5)

## 2013-11-19 LAB — PROTIME-INR
INR: 1.51 — ABNORMAL HIGH (ref 0.00–1.49)
Prothrombin Time: 17.8 seconds — ABNORMAL HIGH (ref 11.6–15.2)

## 2013-11-19 LAB — HEPARIN LEVEL (UNFRACTIONATED)
Heparin Unfractionated: 0.14 IU/mL — ABNORMAL LOW (ref 0.30–0.70)
Heparin Unfractionated: 0.13 IU/mL — ABNORMAL LOW (ref 0.30–0.70)

## 2013-11-19 LAB — APTT: aPTT: 83 seconds — ABNORMAL HIGH (ref 24–37)

## 2013-11-19 MED ORDER — WARFARIN SODIUM 7.5 MG PO TABS
7.5000 mg | ORAL_TABLET | Freq: Once | ORAL | Status: AC
  Administered 2013-11-19: 7.5 mg via ORAL
  Filled 2013-11-19: qty 1

## 2013-11-19 MED ORDER — LISINOPRIL 5 MG PO TABS
5.0000 mg | ORAL_TABLET | Freq: Every day | ORAL | Status: DC
  Administered 2013-11-20: 5 mg via ORAL
  Filled 2013-11-19 (×2): qty 1

## 2013-11-19 NOTE — Progress Notes (Signed)
ANTICOAGULATION CONSULT NOTE - Follow Up Consult  Pharmacy Consult for Heparin  Indication: LVAD  Allergies  Allergen Reactions  . Diovan [Valsartan] Other (See Comments)    Hypotension at low dose, hyperkalemia  . Lipitor [Atorvastatin Calcium] Other (See Comments)    Muscle pain    Patient Measurements: Height: 5\' 7"  (170.2 cm) Weight: 148 lb 2.4 oz (67.2 kg) IBW/kg (Calculated) : 66.1  Vital Signs: Temp: 98.2 F (36.8 C) (03/22 0500) Temp src: Oral (03/22 0500) Pulse Rate: 83 (03/22 0500)  Labs:  Recent Labs  11/17/13 0550 11/18/13 0529 11/18/13 0958 11/18/13 1905 11/19/13 0550  HGB  --  8.3*  --   --  8.5*  HCT  --  26.0*  --   --  26.2*  PLT  --  241  --   --  246  APTT 68*  --  46* 63* 83*  LABPROT 16.3* 14.6  --   --  17.8*  INR 1.34 1.16  --   --  1.51*  HEPARINUNFRC  --  0.11*  --   --  0.14*  CREATININE 0.99  --   --   --   --     Estimated Creatinine Clearance: 64.9 ml/min (by C-G formula based on Cr of 0.99).   Medications:  Heparin 1500 units/hr  Assessment: 73 y/o M with LVAD on heparin. HL is 0.14 this AM, previous pharmacist spoke to Dr. Haroldine Laws and he is okay with using HL goal of 0.2-0.4. Other labs as above. No issues per RN.   Goal of Therapy:  Heparin level 0.2-0.4 units/ml, per Dr. Haroldine Laws  Monitor platelets by anticoagulation protocol: Yes   Plan:  -Increase heparin cautiously to 1550 units/hr -1500 HL -Daily CBC/HL -Monitor for bleeding, trend Hgb  Narda Bonds 11/19/2013,6:51 AM

## 2013-11-19 NOTE — Progress Notes (Signed)
Advanced Heart Failure Team Rounding Note     Subjective:    Mr.Giammarino is a 73 year old patient with a history of CAD and HF s/p LVAD HMII 09/19/2013 recently admitted with SBO. Hospitalization complicated by R hemothorax, PEA arrest and VDRF. Transferred to CIR 3/20  Doing well. Working with rehab. Tolelrating diet. INR up to 1.5, Heparin 0.14  MAPs 90-100  LVAD INTERROGATION:  HeartMate II LVAD: Flow 4.8 liters/min, speed 9400, power 6.0 PI 6.8; No PI event   Physical Exam: GENERAL: Sittingin bed. No distress HEENT: normal   NECK: Supple, JVP 7 CARDIAC: LVAD hum present.  LUNGS: clear. Mildly decreased on R ABDOMEN: soft nontender,  Bowel sounds hypoactive LVAD exit site: well-healed and incorporated. Dressing dry and intact. No erythema or drainage. Stabilization device present and accurately applied. Driveline dressing is being changed daily per sterile technique.  EXTREMITIES: Warm and dry, no cyanosis, clubbing, rash or edema  NEUROLOGIC: awake non-focal.  Telemetry: SR   Labs: Basic Metabolic Panel:  Recent Labs Lab 11/13/13 0400 11/14/13 0511 11/15/13 0600 11/16/13 0510 11/17/13 0550  NA 134* 138 139 138 140  K 4.1 3.9 3.7 3.4* 3.8  CL 98 100 100 100 103  CO2 23 28 26 25 24   GLUCOSE 163* 125* 124* 104* 88  BUN 23 24* 21 18 17   CREATININE 0.87 0.90 0.85 0.88 0.99  CALCIUM 8.5 8.2* 8.5 8.4 8.3*  MG 2.1  --   --  2.1  --   PHOS 3.0  --   --  3.6  --     Liver Function Tests:  Recent Labs Lab 11/13/13 0400 11/14/13 0511 11/15/13 0600 11/16/13 0510 11/17/13 0550  AST 31 26 29 24 24   ALT 50 35 38 32 31  ALKPHOS 152* 124* 145* 151* 146*  BILITOT 1.1 1.2 0.9 0.8 0.8  PROT 6.7 6.0 6.1 6.5 6.5  ALBUMIN 2.6* 2.2* 2.3* 2.4* 2.5*   No results found for this basename: LIPASE, AMYLASE,  in the last 168 hours No results found for this basename: AMMONIA,  in the last 168 hours  CBC:  Recent Labs Lab 11/14/13 0511 11/15/13 0600 11/16/13 0510  11/18/13 0529 11/19/13 0550  WBC 9.2 7.7 7.6 5.5 5.3  HGB 8.9* 8.4* 8.5* 8.3* 8.5*  HCT 27.8* 26.2* 26.3* 26.0* 26.2*  MCV 90.3 90.3 90.7 90.3 90.0  PLT 250 241 272 241 246    Cardiac Enzymes: No results found for this basename: CKTOTAL, CKMB, CKMBINDEX, TROPONINI,  in the last 168 hours  BNP: BNP (last 3 results)  Recent Labs  09/13/13 1349 09/25/13 0400 11/01/13 1033  PROBNP 3883.0* 7019.0* 877.6*    CBG:  Recent Labs Lab 11/16/13 1112 11/16/13 1200 11/16/13 1555 11/16/13 2000 11/17/13 0710  GLUCAP 103* 100* 106* 109* 88    Coagulation Studies:  Recent Labs  11/17/13 0550 11/18/13 0529 11/19/13 0550  LABPROT 16.3* 14.6 17.8*  INR 1.34 1.16 1.51*    Other results:   Imaging: No results found.   Assessment:   1. Hemorrhagic shock due to R hemothorax    --s/p R chest tube 3/8 2. 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 CAD 4. Hypothyroidism 5. SBO 6. Acute blood loss anemia 7. PEA arrest 8. Shock liver 9. A/C renal failure, stage III -resolved 10. Ventilatory-dependent respiratory failure 11. Hypokalemia  Plan/Discussion:     Doing well. Continues to progress. Appreciate CIR's care/  Volume status looks good with spiro. MAP elevated. Will increase lisinopril to 5 daily.  INR now bumping. Appreciate Pharmacy's help. VAD parameters look good.   Daniel Bensimhon,MD 7:59 AM

## 2013-11-19 NOTE — Progress Notes (Signed)
Physical Therapy Note  Patient Details  Name: Johnny Oneill MRN: [id number] Date of Birth: [birth year] Today's Date: 11/19/2013  [id number] (30 minutes) individual Pain: no reported pain Focus of treatment: therapeutic exercise focused on activity tolerance Treatment: Pt in bed upon arrival; pt placed self on batteries; transfers stand/turn close SBA; pt able to stand with close SBA during urination; Nustep level 3 x 10 minutes with one rest break; perceived exertion of 13 / somewhat hard; gait to room 120 feet SBA using IV pole for support; pt stated he will sit up using batteries for LVAD; all needs within reach.    Tyrik Stetzer,JIM 11/19/2013, 10:31 AM

## 2013-11-19 NOTE — Progress Notes (Signed)
ANTICOAGULATION CONSULT NOTE - Follow-Up Consult  Pharmacy Consult for Heparin Indication: LVAD  Allergies  Allergen Reactions  . Diovan [Valsartan] Other (See Comments)    Hypotension at low dose, hyperkalemia  . Lipitor [Atorvastatin Calcium] Other (See Comments)    Muscle pain    Patient Measurements: Height: 5\' 7"  (170.2 cm) Weight: 148 lb 2.4 oz (67.2 kg) IBW/kg (Calculated) : 66.1  Vital Signs:    Labs:  Recent Labs  11/17/13 0550 11/18/13 0529 11/18/13 0958 11/18/13 1905 11/19/13 0550 11/19/13 1824  HGB  --  8.3*  --   --  8.5*  --   HCT  --  26.0*  --   --  26.2*  --   PLT  --  241  --   --  246  --   APTT 68*  --  46* 63* 83*  --   LABPROT 16.3* 14.6  --   --  17.8*  --   INR 1.34 1.16  --   --  1.51*  --   HEPARINUNFRC  --  0.11*  --   --  0.14* 0.13*  CREATININE 0.99  --   --   --   --   --     Estimated Creatinine Clearance: 64.9 ml/min (by C-G formula based on Cr of 0.99).  Assessment: 73 yo male with hx CAD, and HF s/p LVAD HMII 09/19/13.  Admitted 3/4 with SBO/ileus, and Coumadin was held in setting of R hemothorax, s/p chest tube and multiple units transfusion.  Also had R chest drain placed on 3/13 removed 3/18.   Heparin level remains subtherapeutic on 1550 units/hr.  With such a low and narrow goal, pharmacy rate increases have been conservative.  Goal of Therapy:  INR 2-3 Heparin level goal 0.2-0.4 (per MD) Monitor platelets by anticoagulation protocol: Yes   Plan:  1. Increase heparin to 1650 units/hr. 2. Next heparin level at 0400 3/23. 3. Heparin level and CBC daily.  [employer], Pharm.D., BCPS Clinical Pharmacist Pager [phone] 11/19/2013 9:13 PM

## 2013-11-19 NOTE — Progress Notes (Signed)
[facility]     PROGRESS NOTE    Subjective/Complaints: Slept better with addition of benadryl yesterday. Had a good day with therapies yesterday  A 12 point review of systems has been performed and if not noted above is otherwise negative.   Objective: Vital Signs: Pulse 83, temperature 98.2 F (36.8 C), temperature source Oral, resp. rate 17, height 5\' 7"  (1.702 m), weight 67.2 kg (148 lb 2.4 oz), SpO2 93.00%. No results found.  Recent Labs  11/18/13 0529 11/19/13 0550  WBC 5.5 5.3  HGB 8.3* 8.5*  HCT 26.0* 26.2*  PLT 241 246    Recent Labs  11/17/13 0550  NA 140  K 3.8  CL 103  GLUCOSE 88  BUN 17  CREATININE 0.99  CALCIUM 8.3*   CBG (last 3)   Recent Labs  11/16/13 1555 11/16/13 2000 11/17/13 0710  GLUCAP 106* 109* 88    Wt Readings from Last 3 Encounters:  11/19/13 67.2 kg (148 lb 2.4 oz)  11/16/13 72.2 kg (159 lb 2.8 oz)  11/16/13 72.2 kg (159 lb 2.8 oz)    Physical Exam:  Constitutional: He is oriented to person, place, and time. He appears well-developed.  No distress HENT:  Head: Normocephalic and atraumatic.  Eyes: EOM are normal.  Neck: Normal range of motion. Neck supple. No JVD present. No tracheal deviation present. No thyromegaly present.  Cardiovascular:  Hum of LVAD auscultated  Respiratory: Breath sounds normal. No respiratory distress. Slight decrease in sounds on the right.  GI: Soft. Bowel sounds are normal. He exhibits no distension.  Lymphadenopathy:  He has no cervical adenopathy.  Neurological: He is alert and oriented to person, place, and time.  Motor 5/5 UE prox to distal, LE 4- HF, 4 KE, ankles 4+. Fatigues quickly. No gross sensory findings. CN exam intact. Good insight and awareness. Normal memory. DTR's 1+  Skin:  chest tube sites clean and dry, dressed with minimal drainage. Psychiatric: He has a normal mood and affect. His behavior is normal. Judgment and thought content  normal   Assessment/Plan: 1. Functional deficits secondary to deconditioning after hemothorax/VATS which require 3+ hours per day of interdisciplinary therapy in a comprehensive inpatient rehab setting. Physiatrist is providing close team supervision and 24 hour management of active medical problems listed below. Physiatrist and rehab team continue to assess barriers to discharge/monitor patient progress toward functional and medical goals. FIM: FIM - Bathing Bathing Steps Patient Completed: Chest;Right Arm;Left Arm;Abdomen;Right upper leg;Left upper leg;Left lower leg (including foot);Right lower leg (including foot) Bathing: 4: Min-Patient completes 8-9 51f 10 parts or 75+ percent  FIM - Upper Body Dressing/Undressing Upper body dressing/undressing steps patient completed: Thread/unthread right sleeve of pullover shirt/dresss;Thread/unthread left sleeve of pullover shirt/dress;Put head through opening of pull over shirt/dress;Pull shirt over trunk Upper body dressing/undressing: 4: Min-Patient completed 75 plus % of tasks FIM - Lower Body Dressing/Undressing Lower body dressing/undressing steps patient completed: Thread/unthread right underwear leg;Thread/unthread left underwear leg;Pull underwear up/down;Thread/unthread right pants leg;Pull pants up/down;Don/Doff right sock;Don/Doff left sock;Don/Doff right shoe;Fasten/unfasten right shoe;Fasten/unfasten left shoe Lower body dressing/undressing: 4: Min-Patient completed 75 plus % of tasks     FIM - Radio producer Devices: Insurance account manager Transfers: 4-To toilet/BSC: Min A (steadying Pt. > 75%);4-From toilet/BSC: Min A (steadying Pt. > 75%)  FIM - Bed/Chair Transfer Bed/Chair Transfer Assistive Devices:  (head of bed flat) Bed/Chair Transfer: 4: Bed > Chair or W/C: Min A (steadying Pt. > 75%);4: Chair or W/C >  Bed: Min A (steadying Pt. > 75%)  FIM - Locomotion: Wheelchair Distance: 5 feet, utilizing B LEs   Locomotion: Wheelchair: 1: Travels less than 50 ft with supervision, cueing or coaxing FIM - Locomotion: Ambulation Locomotion: Ambulation Assistive Devices: Administrator Ambulation/Gait Assistance: 4: Min assist Locomotion: Ambulation: 4: Travels 150 ft or more with minimal assistance (Pt.>75%)  Comprehension Comprehension Mode: Auditory Comprehension: 7-Follows complex conversation/direction: With no assist  Expression Expression Mode: Verbal Expression: 7-Expresses complex ideas: With no assist  Social Interaction Social Interaction: 6-Interacts appropriately with others with medication or extra time (anti-anxiety, antidepressant).  Problem Solving Problem Solving: 6-Solves complex problems: With extra time  Memory Memory: 7-Complete Independence: No helper Medical Problem List and Plan:  1. deconditioning related to multi-medical, history of right status post VATS as well as LVAD January 2015  2. DVT Prophylaxis/Anticoagulation: Coumadin per pharmacy. Monitor for any bleeding episodes  3. Pain Management: Tylenol as needed  3. Mood/anxiety. Low-dose Xanax as needed.  5. Neuropsych: This patient is capable of making decisions on his own behalf.  6. History of LVAD January 2015. Followup per cardiology services. Pt is competent to help direct mgt of the device as well.  7. Chronic anemia. Latest hemoglobin 8.5. Continue iron supplement  8. Hypertension. lisinopril 2.5 mg daily, Aldactone 12.5 mg daily. MAP 90-100 9. Diastolic congestive heart failure. Continue Lasix 40 mg daily.    -check daily weights, follow I's and O's ---67kg today, neg so far 10.[name] Hypothyroidism. Synthroid  11. Wound care: staples can  come out of anterior chest site--   LOS (Days) 2 A FACE TO FACE EVALUATION WAS PERFORMED  Arisbeth Purrington T 11/19/2013 8:54 AM

## 2013-11-19 NOTE — Progress Notes (Deleted)
At 1015, pt had passed 8 hours without void and attempted to without success. Bladder scan for 400cc and I&O cath for 800cc. Pt reported he felt like he needed to have a BM as well. Assisted pt to [facility] where he had a large BM. Hopeful that this will alleviate urinary retention issue. Hortencia Conradi RN

## 2013-11-19 NOTE — Progress Notes (Signed)
ANTICOAGULATION CONSULT NOTE - [facility] for warfarin Indication: LVAD  Allergies  Allergen Reactions  . Diovan [Valsartan] Other (See Comments)    Hypotension at low dose, hyperkalemia  . Lipitor [Atorvastatin Calcium] Other (See Comments)    Muscle pain    Patient Measurements: Height: 5\' 7"  (170.2 cm) Weight: 148 lb 2.4 oz (67.2 kg) IBW/kg (Calculated) : 66.1  Vital Signs: Temp: 98.2 F (36.8 C) (03/22 0500) Temp src: Oral (03/22 0500) Pulse Rate: 83 (03/22 0500)  Labs:  Recent Labs  11/17/13 0550 11/18/13 0529 11/18/13 0958 11/18/13 1905 11/19/13 0550  HGB  --  8.3*  --   --  8.5*  HCT  --  26.0*  --   --  26.2*  PLT  --  241  --   --  246  APTT 68*  --  46* 63* 83*  LABPROT 16.3* 14.6  --   --  17.8*  INR 1.34 1.16  --   --  1.51*  HEPARINUNFRC  --  0.11*  --   --  0.14*  CREATININE 0.99  --   --   --   --     Estimated Creatinine Clearance: 64.9 ml/min (by C-G formula based on Cr of 0.99).  Assessment: 73 yo male with hx CAD, and HF s/p LVAD HMII 09/19/13.  Admitted 3/4 with SBO/ileus, and Coumadin was held in setting of R hemothorax, s/p chest tube and multiple units transfusion.  Also had R chest drain placed on 3/13 removed 3/18.   INR was beginning to increase, however it fell. Now back on the rise- 1.5 this morning.   Heparin level this morning was slightly low and rate was increased. Awaiting repeat level.  Goal of Therapy:  INR 2-3 Monitor platelets by anticoagulation protocol: Yes   Plan:  1. Coumadin 7.5mg  po x 1 tonight 2. Continue daily PT/INR 3. Follow for repeat HL  Anise Harbin D. Rey Fors, PharmD, BCPS Clinical Pharmacist Pager: [phone] 11/19/2013 10:50 AM

## 2013-11-20 ENCOUNTER — Inpatient Hospital Stay (HOSPITAL_COMMUNITY): Payer: Medicare Other

## 2013-11-20 ENCOUNTER — Inpatient Hospital Stay (HOSPITAL_COMMUNITY): Payer: Medicare Other | Admitting: Physical Therapy

## 2013-11-20 DIAGNOSIS — I2510 Atherosclerotic heart disease of native coronary artery without angina pectoris: Secondary | ICD-10-CM

## 2013-11-20 DIAGNOSIS — I5022 Chronic systolic (congestive) heart failure: Secondary | ICD-10-CM

## 2013-11-20 DIAGNOSIS — R5381 Other malaise: Secondary | ICD-10-CM

## 2013-11-20 LAB — CBC WITH DIFFERENTIAL/PLATELET
Basophils Absolute: 0 10*3/uL (ref 0.0–0.1)
Basophils Relative: 1 % (ref 0–1)
Eosinophils Absolute: 0.1 10*3/uL (ref 0.0–0.7)
Eosinophils Relative: 2 % (ref 0–5)
HCT: 28 % — ABNORMAL LOW (ref 39.0–52.0)
Hemoglobin: 8.9 g/dL — ABNORMAL LOW (ref 13.0–17.0)
Lymphocytes Relative: 14 % (ref 12–46)
Lymphs Abs: 0.7 10*3/uL (ref 0.7–4.0)
MCH: 29 pg (ref 26.0–34.0)
MCHC: 31.8 g/dL (ref 30.0–36.0)
MCV: 91.2 fL (ref 78.0–100.0)
Monocytes Absolute: 0.5 10*3/uL (ref 0.1–1.0)
Monocytes Relative: 10 % (ref 3–12)
Neutro Abs: 3.4 10*3/uL (ref 1.7–7.7)
Neutrophils Relative %: 73 % (ref 43–77)
Platelets: 239 10*3/uL (ref 150–400)
RBC: 3.07 MIL/uL — ABNORMAL LOW (ref 4.22–5.81)
RDW: 18 % — ABNORMAL HIGH (ref 11.5–15.5)
WBC: 4.7 10*3/uL (ref 4.0–10.5)

## 2013-11-20 LAB — COMPREHENSIVE METABOLIC PANEL
ALT: 23 U/L (ref 0–53)
AST: 20 U/L (ref 0–37)
Albumin: 2.5 g/dL — ABNORMAL LOW (ref 3.5–5.2)
Alkaline Phosphatase: 122 U/L — ABNORMAL HIGH (ref 39–117)
BUN: 13 mg/dL (ref 6–23)
CO2: 22 mEq/L (ref 19–32)
Calcium: 8.3 mg/dL — ABNORMAL LOW (ref 8.4–10.5)
Chloride: 104 mEq/L (ref 96–112)
Creatinine, Ser: 0.97 mg/dL (ref 0.50–1.35)
GFR calc Af Amer: >90 mL/min (ref >90)
GFR calc non Af Amer: 82 mL/min — ABNORMAL LOW (ref >90)
Glucose, Bld: 87 mg/dL (ref 70–99)
Potassium: 3.1 mEq/L — ABNORMAL LOW (ref 3.7–5.3)
Sodium: 139 mEq/L (ref 137–147)
Total Bilirubin: 0.7 mg/dL (ref 0.3–1.2)
Total Protein: 6.4 g/dL (ref 6.0–8.3)

## 2013-11-20 LAB — APTT: aPTT: 174 seconds — ABNORMAL HIGH (ref 24–37)

## 2013-11-20 LAB — PROTIME-INR
INR: 1.67 — ABNORMAL HIGH (ref 0.00–1.49)
Prothrombin Time: 19.2 seconds — ABNORMAL HIGH (ref 11.6–15.2)

## 2013-11-20 LAB — HEPARIN LEVEL (UNFRACTIONATED)
Heparin Unfractionated: 0.4 IU/mL (ref 0.30–0.70)
Heparin Unfractionated: 0.23 IU/mL — ABNORMAL LOW (ref 0.30–0.70)

## 2013-11-20 LAB — GLUCOSE, CAPILLARY: Glucose-Capillary: 108 mg/dL — ABNORMAL HIGH (ref 70–99)

## 2013-11-20 MED ORDER — POTASSIUM CHLORIDE CRYS ER 20 MEQ PO TBCR
40.0000 meq | EXTENDED_RELEASE_TABLET | Freq: Two times a day (BID) | ORAL | Status: AC
  Administered 2013-11-20 (×2): 40 meq via ORAL
  Filled 2013-11-20 (×3): qty 2

## 2013-11-20 MED ORDER — HYDRALAZINE HCL 20 MG/ML IJ SOLN
10.0000 mg | Freq: Once | INTRAMUSCULAR | Status: AC
  Administered 2013-11-20: 10 mg via INTRAVENOUS
  Filled 2013-11-20: qty 0.5

## 2013-11-20 MED ORDER — WARFARIN SODIUM 7.5 MG PO TABS
7.5000 mg | ORAL_TABLET | Freq: Once | ORAL | Status: AC
  Administered 2013-11-20: 7.5 mg via ORAL
  Filled 2013-11-20: qty 1

## 2013-11-20 MED ORDER — LISINOPRIL 10 MG PO TABS
10.0000 mg | ORAL_TABLET | Freq: Every day | ORAL | Status: DC
  Administered 2013-11-21 – 2013-11-22 (×2): 10 mg via ORAL
  Filled 2013-11-20 (×4): qty 1

## 2013-11-20 NOTE — Progress Notes (Signed)
[facility] Individual Statement of Services  Patient Name:  Johnny Oneill  Date:  11/20/2013  Welcome to the [facility].  Our goal is to provide you with an individualized program based on your diagnosis and situation, designed to meet your specific needs.  With this comprehensive rehabilitation program, you will be expected to participate in at least 3 hours of rehabilitation therapies Monday-Friday, with modified therapy programming on the weekends.  Your rehabilitation program will include the following services:  Physical Therapy (PT), Occupational Therapy (OT), Speech Therapy (ST), 24 hour per day rehabilitation nursing, Therapeutic Recreaction (TR), Case Management (Social Worker), Rehabilitation Medicine, Nutrition Services and Pharmacy Services  Weekly team conferences will be held on Tuesdays to discuss your progress.  Your Social Worker will talk with you frequently to get your input and to update you on team discussions.  Team conferences with you and your family in attendance may also be held.  Expected length of stay: 5-7 days  Overall anticipated outcome: modified independent  Depending on your progress and recovery, your program may change. Your Social Worker will coordinate services and will keep you informed of any changes. Your Social Worker's name and contact numbers are listed  below.  The following services may also be recommended but are not provided by the [facility] will be made to provide these services after discharge if needed.  Arrangements include referral to agencies that provide these services.  Your insurance has been verified to be:  Medicare and [name] Your primary doctor is:  Dr. Lavone Orn  Pertinent information will be shared with your doctor and your  insurance company.  Social Worker:  [place], [place] or ([phone]   Information discussed with and copy given to patient by: Lennart Pall, 11/20/2013, 4:49 PM

## 2013-11-20 NOTE — Progress Notes (Signed)
Patient information reviewed and entered into eRehab system by Shirl Weir, RN, CRRN, PPS Coordinator.  Information including medical coding and functional independence measure will be reviewed and updated through discharge.     Per nursing patient was given "Data Collection Information Summary for Patients in Inpatient Rehabilitation Facilities with attached "Privacy Act Statement-Health Care Records" upon admission.  

## 2013-11-20 NOTE — Progress Notes (Signed)
Occupational Therapy Session Note  Patient Details  Name: Johnny Oneill MRN: [id number] Date of Birth: 06-21-1941  Today's Date: 11/20/2013 Time: 0937 -[id number]  Time Calculation (min): 38 min  Short Term Goals: Week 1:  OT Short Term Goal 1 (Week 1): STG = LTGs due to ELOS  Skilled Therapeutic Interventions/Progress Updates: ADL-retraining at sink and edge of bed with focus on sequencing, sitn >< stand, functional mobility, endurance, and dynamic sitting and standing balance.   Patient reported completing bathing at sink prior to session but delayed dressing until OT arrived, as advised by RN attending.   Patient required min verbal cues for sequencing and problem-solving with upper body dressing and min assist to lace belt through belt loops d/t confusion with placement of control module.   Patient donned battery pack with min assist to manage velcro closures.   Patient then groomed at sink independently ambulated from room to gym using IV pole and steadying assist, intermittently.   Patient demo'd mild LOB 2 times during mobility while negotiating obstacles and required close supervision for safety due to highly distracting environment.    Patient then completed transfer and 7 min of LB strengthening and general endurance training using NuStep (legs only), level 3.   After brief rest break, patient was escorted back to his room with min guard assist and returned to his recliner; call light and phone within reach.      Therapy Documentation Precautions:  Precautions Precautions: Fall Precaution Comments: lightheadedness with position changes Restrictions Weight Bearing Restrictions: No Other Position/Activity Restrictions: sternal precautions  Pain:  Pain Assessment Pain Assessment: No/denies pain  See FIM for current functional status  Therapy/Group: Individual Therapy  Second session: Time: [id number] Time Calculation (min):  45 min  Pain Assessment: No pain  Skilled Therapeutic  Interventions: Therapeutic activity with focus on standing dynamic balance training using Nintendo Wii balance board.   Patient educated on use of device and he completed fitness test and balance training task with excellent attention and no LOB during task.   Patient required 2 rest breaks during session and confirmed benefit of task as informative and entertaining, stating, "I'm going to get one of these for home."    During center-of-balance assessment, patient noted unequal weight distribution (57% @ LLE, 43% @ RLE).  See FIM for current functional status  Therapy/Group: Individual Therapy  [place] 11/20/2013, 12:19 PM

## 2013-11-20 NOTE — Progress Notes (Signed)
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 Now extubated, doing well. All right pleural catheters and drains have been removed without evidence of recurrent hemothorax. The patient remains on IV heparin while INR is slowly rising with reinitiation of Coumadin  Patient's symptoms of small bowel obstruction have resolved and is on a regular diet with normal bowel movements. Patient is ambulating and receiving physical therapy and occupational therapy in the inpatient rehabilitation unit--appreciate excellent care.  The patient's weight has been stable there is no peripheral edema. His mean arterial pressures have been slightly high and blood pressure medication has been adjusted.   LVAD INTERROGATION HeartMate II LVAD:  Flow 4.1 liters/min, speed 9400 RPM, power 5.4, PI 5.1  1 PI event   Objective:    Vital Signs:   Temp:  [98.1 F (36.7 C)] 98.1 F (36.7 C) (03/23 0628) Pulse Rate:  [76] 76 (03/23 0628) Resp:  [18] 18 (03/23 0628) BP: (115)/(78) 115/78 mmHg (03/23 0628) SpO2:  [93 %-98 %] 93 % (03/23 0700) Weight:  [148 lb 13 oz (67.5 kg)] 148 lb 13 oz (67.5 kg) (03/23 0628) Last BM Date: 11/19/13 Mean arterial Pressure  82-90 mm Hg Intake/Output:   Intake/Output Summary (Last 24 hours) at 11/20/13 1128 Last data filed at 11/20/13 0900  Gross per 24 hour  Intake    480 ml  Output    500 ml  Net    -20 ml     Physical Exam: General:  Well appearing. extubated and responsive, breathing comfortably  HEENT:  normal Neck: supple. Mild JVD; no bruits. No lymphadenopathy or thryomegaly appreciated. Cor: Mechanical heart sounds with LVAD hum present. Lungs: clear, right VATS and Chest tube incisions clean and dry Abdomen: soft, nontender, mildly distended. No hepatosplenomegaly. No bruits or masses.  Drive line     dressing dry and intact, bowel sounds  normal. Driveline: C/D/I; securement device intact and driveline incorporated Extremities: no cyanosis, clubbing, rash, mild edema, extremities warm  Neuro: Intact moves all extremities but weak  Telemetry: sinus rhythm  Labs: Basic Metabolic Panel:  Recent Labs Lab 11/14/13 0511 11/15/13 0600 11/16/13 0510 11/17/13 0550 11/20/13 0733  NA 138 139 138 140 139  K 3.9 3.7 3.4* 3.8 3.1*  CL 100 100 100 103 104  CO2 28 26 25 24 22   GLUCOSE 125* 124* 104* 88 87  BUN 24* 21 18 17 13   CREATININE 0.90 0.85 0.88 0.99 0.97  CALCIUM 8.2* 8.5 8.4 8.3* 8.3*  MG  --   --  2.1  --   --   PHOS  --   --  3.6  --   --     Liver Function Tests:  Recent Labs Lab 11/14/13 0511 11/15/13 0600 11/16/13 0510 11/17/13 0550 11/20/13 0733  AST 26 29 24 24 20   ALT 35 38 32 31 23  ALKPHOS 124* 145* 151* 146* 122*  BILITOT 1.2 0.9 0.8 0.8 0.7  PROT 6.0 6.1 6.5 6.5 6.4  ALBUMIN 2.2* 2.3* 2.4* 2.5* 2.5*   No results found for this basename: LIPASE, AMYLASE,  in the last 168 hours No results found for this  basename: AMMONIA,  in the last 168 hours  CBC:  Recent Labs Lab 11/15/13 0600 11/16/13 0510 11/18/13 0529 11/19/13 0550 11/20/13 0733  WBC 7.7 7.6 5.5 5.3 4.7  NEUTROABS  --   --   --   --  3.4  HGB 8.4* 8.5* 8.3* 8.5* 8.9*  HCT 26.2* 26.3* 26.0* 26.2* 28.0*  MCV 90.3 90.7 90.3 90.0 91.2  PLT 241 272 241 246 239    INR:  Recent Labs Lab 11/16/13 0510 11/17/13 0550 11/18/13 0529 11/19/13 0550 11/20/13 0733  INR 1.13 1.34 1.16 1.51* 1.67*    Other results:  EKG:   Imaging: No results found.   Medications:     Scheduled  Medications: . antiseptic oral rinse  15 mL Mouth Rinse Q4H  . aspirin EC  325 mg Oral Daily  . budesonide (PULMICORT) nebulizer solution  0.25 mg Nebulization BID  . citalopram  10 mg Oral Daily  . feeding supplement (ENSURE COMPLETE)  237 mL Oral BID BM  . ferrous sulfate  325 mg Oral TID WC  . levothyroxine  200 mcg Oral QAC breakfast  . lisinopril  5 mg Oral Daily  . pantoprazole  40 mg Oral Daily  . potassium chloride  40 mEq Oral BID  . spironolactone  12.5 mg Oral Daily  . warfarin  7.5 mg Oral ONCE-1800  . Warfarin - Pharmacist Dosing Inpatient   Does not apply q1800    Infusions: . heparin 1,650 Units/hr (11/19/13 2139)    PRN Medications: diphenhydrAMINE, ondansetron (ZOFRAN) IV, ondansetron, sorbitol, zolpidem   Assessment:PLAN   Cont heparin until INR 2.0- starting po coumadin  Systemic anticoagulation with heparin, goal PTT 50-60  Continue PT,appreciate expert rehab therapy by CIR team.  Drive line dressing changes per protocol-Monday Wednesday Friday schedule  No evidence recurrent small bowel obstruction - hopefully patient will be ready for discharge later in the week      I reviewed the LVAD parameters from today, and compared the results to the patient's prior recorded data.  No programming changes were made.  The LVAD is functioning within specified parameters.  The patient performs LVAD self-test daily.  LVAD interrogation was negative for any significant power changes, alarms or PI events/speed drops.  LVAD equipment check completed and is in good working order.  Back-up equipment present.   LVAD education done on emergency procedures and precautions and reviewed exit site care.  Length of Stay: 3  VAN TRIGT III,PETER 11/20/2013, 11:28 AM  VAD Team Pager [phone] (7am - 7am)

## 2013-11-20 NOTE — Progress Notes (Signed)
Social Work  Social Work Assessment and Plan  Patient Details  Name: Johnny Oneill MRN: [id number] Date of Birth: 11-05-1940  Today's Date: 11/20/2013  Problem List:  Patient Active Problem List   Diagnosis Date Noted  . Physical deconditioning 11/17/2013  . Hemorrhagic shock 11/06/2013  . Cardiac arrest 11/06/2013  . Hemothorax on right 11/06/2013  . Acute respiratory failure 11/06/2013  . Poor venous access 11/02/2013  . Vomiting 11/01/2013  . Weakness 11/01/2013  . Acute delirium 09/25/2013  . LVAD (left ventricular assist device) present 09/18/2013  . Fever 09/17/2013  . Palliative care encounter 09/15/2013  . Weakness generalized 09/15/2013  . Nonspecific (abnormal) findings on radiological and other examination of gastrointestinal tract 09/14/2013  . Anemia, unspecified 09/14/2013  . Acute on chronic renal failure 09/13/2013  . Acute sinusitis 07/24/2013  . 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 Acute on chronic systolic heart failure 0000000  . PVC (premature ventricular contraction) 12/08/2012  . NSVT (nonsustained ventricular tachycardia) 12/08/2012  . Cardiogenic shock 12/07/2012  . Acute blood loss anemia 12/01/2011  . Essential hypertension, benign 11/12/2010  . ISCHEMIC CARDIOMYOPATHY 11/12/2010  . Chronic systolic heart failure [id number]  . Ischemic cardiomyopathy   . Myocardial infarction of lateral wall   . Hypercholesteremia   . ICD (implantable cardiac defibrillator) in place   . CAD (coronary artery disease)   . Osteoarthritis    Past Medical History:  Past Medical History  Diagnosis Date  . Ischemic cardiomyopathy     a. 10/09 Echo: Sev LV Dysfxn, inf/lat AK, Mod MR.  [name];  s/p Lat MI 2009 with occlusion of the LCX - treated with Promus stenting. Has total occlusion of the RCA.   . ICD  (implantable cardiac defibrillator) in place     PROPHYLACTIC      medtronic  . RBBB   . 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 Hypertension   . PVC (premature ventricular contraction)   . Pacemaker   . CHF (congestive heart failure)   . Shortness of breath   . Automatic implantable cardioverter-defibrillator in situ    Past Surgical History:  Past Surgical History  Procedure Laterality Date  . Defibrillator  2009  . Total knee arthroplasty  11/27/11    left  . Insert / replace / remove pacemaker  2009  . Coronary angioplasty with stent placement  2004  . Coronary angioplasty with stent placement  07/2008  . Knee arthroscopy w/ partial medial meniscectomy  12/2002    left  . Knee arthroscopy      bilaterally  . Total knee arthroplasty  11/27/2011    Procedure: TOTAL KNEE ARTHROPLASTY;  Surgeon: Yvette Rack., MD;  Location: [place];  Service: Orthopedics;  Laterality: Left;  left total knee arthroplasty  . Esophagogastroduodenoscopy N/A 09/15/2013    Procedure: ESOPHAGOGASTRODUODENOSCOPY (EGD);  Surgeon: Ladene Artist, MD;  Location[facility] END[facility]COPY;  Service: Endoscopy;  Laterality: N/A;  bedside  . Insertion of implantable left ventricular assist device N/A 09/18/2013    Procedure: INSERTION OF IMPLANTABLE LEFT VENTRICULAR ASSIST DEVICE;  Surgeon: Ivin Poot, MD;  Location: [place];  Service: Open Heart Surgery;  Laterality: N/A;  CIRC ARREST  NITRIC OXIDE  . Intraoperative transesophageal echocardiogram N/A 09/18/2013    Procedure: INTRAOPERATIVE TRANSESOPHAGEAL ECHOCARDIOGRAM;  Surgeon: Ivin Poot, MD;  Location: [place];  Service: Open Heart Surgery;  Laterality: N/A;  . Video assisted thoracoscopy Right 11/06/2013    Procedure: VIDEO ASSISTED THORACOSCOPY;  Surgeon: Gaye Pollack, MD;  Location: [place];  Service: Cardiothoracic;  Laterality: Right;  . Hematoma evacuation Right 11/06/2013    Procedure: EVACUATION HEMATOMA;  Surgeon: Gaye Pollack, MD;  Location: [place];  Service:  Cardiothoracic;  Laterality: Right;   Social History:  reports that he has never smoked. He has never used smokeless tobacco. He reports that he drinks alcohol. He reports that he does not use illicit drugs.  Family / Support Systems Marital Status: Married Patient Roles: Spouse;Parent (Patient has a Clinical research associate in [place].  Wife has a son.) Spouse/Significant Other: wife, Johnny Oneill @ [phone] or [phone]) [phone] or (C) [phone] Children: one adult daughter living in [place] Anticipated Caregiver: Johnny Oneill  Ability/Limitations of Caregiver: Wife is retired and can assist.  Wife does dressing changes for LVAD QOD Caregiver Availability: Other (Comment) (Wife does not stay with patient all the time.) Family Dynamics: pt describes wife as very supportive and "... 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  Remains as active in the community as he can be with the LVAD.  Denies any emotional distress.  Admits simple frustration with hospitalization and eager to d/c and get to [employer] game he has tickets for on Friday. Recent Psychosocial Issues: placement of LVAD 08/2013 Pyschiatric History: none Substance Abuse History: none  Patient / Family Perceptions, Expectations & Goals Pt/Family understanding of illness & functional limitations: pt and wife with good  understanding of medical issues and current endurance/ functional limitations. Premorbid pt/family roles/activities: pt remained active despite placement of LVAD.   Anticipated changes in roles/activities/participation: little change anticipated if reaching supervision to mod i goals Pt/family expectations/goals: "Just want to get to my game Human resources officer) at the end of the week"  [employer]: None Premorbid Home Care/DME Agencies: None Transportation available at discharge: yes  Discharge Planning Living Arrangements: Spouse/significant other [place]: Spouse/significant other;Children;Friends/neighbors;Church/faith community Type of Residence: Private residence Administrator, sports: Multimedia programmer (specify);Medicare Nurse, mental health ) Financial Resources: Social Neurosurgeon Screen Referred: No Living Expenses: Own Money Management: Patient Does the patient have any problems obtaining your medications?: No Home Management: shared with wife Patient/Family Preliminary Plans: pt plans to return home with wife resuming caregiver assist as needed Social Work Anticipated Follow Up Needs: HH/OP Expected length of stay: 1 week  Clinical Impression Very pleasant gentleman here for deconditioning following multiple medical issues.  Very focused on being able to go home by end of week (has tickets to [employer]).  Motivated and denies any significant emotional distress.  Good support from wife.  Will follow for d/c planning needs.  Abbigail Anstey 11/20/2013, 4:48 PM

## 2013-11-20 NOTE — Progress Notes (Signed)
Physical Therapy Session Note  Patient Details  Name: Johnny Oneill MRN: [id number] Date of Birth: 1941/04/09  Today's Date: 11/20/2013 Time: 1030-1128 Time Calculation (min): 58 min  Short Term Goals: Week 1:  PT Short Term Goal 1 (Week 1): STG = LTG  Skilled Therapeutic Interventions/Progress Updates:    Ambulation to/from gym (to) ~130', (from) 50', 30', 50'without device, intermittent support from IV pole performed with min-guard assist. Stairs with single rail x 5 with min-guard assist. Standing balance static and dynamic activities on compliant surface working on single limb stance and narrow base, stepping over obstacles. Squats for strengthening 3 x 10-15 reps with seated rest in between. Pt requires increased restbreaks compared to baseline per pt.   Pt became lightheaded with ambulation to room and required chair to be pulled behind, rest of ambulation performed with w/c to follow. No BPs taken immediately due to pt with LVAD and only standard gym BP machines available. Pt reports he felt his "heart medicine" was "too strong." No c/o pain.   Second Session Time:  [id number] Time Calculation (min): 50 min Skilled Therapeutic Interventions/Progress Updates:  Session focused on ambulation endurance and rollator training. Pt still imbalanced with/out AD, discussed use of AD vs. No AD at home pt reports he would rather go home earlier with an AD than later with/out AD. Discussed usefulness of seat for rest due to impaired endurance and bouts of lightheadedness requiring seated rest. Pt ambulated with rollator, added visual, speed, obstacle and back stepping challenges. Pt able to ambulate varied distances from 200' to 39' with rollater/S before fatigue. Pt did become "lightheaded" once but his improved with seated rest. Pt practiced locking brakes and sitting on rollator seat x 3 reps. Car transfer with wife educated on positioning, min-guard assist. PT practiced with wife simulated assisted  fall.   Spoke to Herbalist about lightheadedness who recommended nursing come to therapy tomorrow to check pt's pressures during ambulation and to see if pressures drop.   Therapy Documentation Precautions:  Precautions Precautions: Fall Precaution Comments: lightheadedness with position changes Restrictions Weight Bearing Restrictions: No Other Position/Activity Restrictions: sternal precautions  See FIM for current functional status  Therapy/Group: Individual Therapy both sessions  Lahoma Rocker 11/20/2013, 12:20 PM

## 2013-11-20 NOTE — Progress Notes (Addendum)
ANTICOAGULATION CONSULT NOTE - Follow-Up Consult  Pharmacy Consult for Heparin/ Coumadin Indication: LVAD  Allergies  Allergen Reactions  . Diovan [Valsartan] Other (See Comments)    Hypotension at low dose, hyperkalemia  . Lipitor [Atorvastatin Calcium] Other (See Comments)    Muscle pain    Patient Measurements: Height: 5\' 7"  (170.2 cm) Weight: 148 lb 13 oz (67.5 kg) IBW/kg (Calculated) : 66.1  Vital Signs: Temp: 98.1 F (36.7 C) (03/23 0628) Temp src: Oral (03/23 0628) BP: 115/78 mmHg (03/23 0628) Pulse Rate: 76 (03/23 0628)  Labs:  Recent Labs  11/18/13 0529  11/18/13 1905 11/19/13 0550 11/19/13 1824 11/20/13 0733 11/20/13 1205  HGB 8.3*  --   --  8.5*  --  8.9*  --   HCT 26.0*  --   --  26.2*  --  28.0*  --   PLT 241  --   --  246  --  239  --   APTT  --   < > 63* 83*  --  174*  --   LABPROT 14.6  --   --  17.8*  --  19.2*  --   INR 1.16  --   --  1.51*  --  1.67*  --   HEPARINUNFRC 0.11*  --   --  0.14* 0.13* 0.40 0.23*  CREATININE  --   --   --   --   --  0.97  --   < > = values in this interval not displayed.  Estimated Creatinine Clearance: 66.3 ml/min (by C-G formula based on Cr of 0.97).  . heparin 1,650 Units/hr (11/20/13 1152)    Assessment: 73 yo male with hx CAD, and HF s/p LVAD HMII 09/19/13.  Admitted 3/4 with SBO/ileus, and Coumadin was held in setting of R hemothorax, s/p chest tube and multiple units transfusion.  Also had R chest drain placed on 3/13 removed 3/18.   Heparin level now therapeutic on 1650 units/hr.  With such a low and narrow goal, pharmacy rate increases have been conservative.  PTT with large increase to 174, but heparin level tends to be more sensitive measure.  Goal of Therapy:  INR 2-3 Heparin level goal 0.2-0.4 (per MD) Monitor platelets by anticoagulation protocol: Yes   Plan:  1. Continue IV heparin at 1650 units/hr. 2. Recheck heparin level in 6 hrs to confirm. 3. Heparin level and CBC daily. 4. Coumadin 7.5  mg po x 1 tonight. 5. Continue daily PT/INR.  Uvaldo Rising, BCPS  Clinical Pharmacist Pager [phone]  11/20/2013 2:47 PM    Addendum: Repeat Heparin level remains in goal range at 0.23.  Will continue heparin at current rate.  No bleeding or complications noted.  F/u AM heparin level.  Uvaldo Rising, BCPS  Clinical Pharmacist Pager [phone]  11/20/2013 2:48 PM

## 2013-11-20 NOTE — Progress Notes (Signed)
PHYSICAL MEDICINE & REHABILITATION     PROGRESS NOTE    Subjective/Complaints: No new complaints. A little restless last night.   A 12 point review of systems has been performed and if not noted above is otherwise negative.   Objective: Vital Signs: Blood pressure 115/78, pulse 76, temperature 98.1 F (36.7 C), temperature source Oral, resp. rate 18, height 5\' 7"  (1.702 m), weight 67.5 kg (148 lb 13 oz), SpO2 93.00%. No results found.  Recent Labs  11/19/13 0550 11/20/13 0733  WBC 5.3 4.7  HGB 8.5* 8.9*  HCT 26.2* 28.0*  PLT 246 239   No results found for this basename: NA, K, CL, CO, GLUCOSE, BUN, CREATININE, CALCIUM,  in the last 72 hours CBG (last 3)  No results found for this basename: GLUCAP,  in the last 72 hours  Wt Readings from Last 3 Encounters:  11/20/13 67.5 kg (148 lb 13 oz)  11/16/13 72.2 kg (159 lb 2.8 oz)  11/16/13 72.2 kg (159 lb 2.8 oz)    Physical Exam:  Constitutional: He is oriented to person, place, and time. He appears well-developed.  No distress HENT:  Head: Normocephalic and atraumatic.  Eyes: EOM are normal.  Neck: Normal range of motion. Neck supple. No JVD present. No tracheal deviation present. No thyromegaly present.  Cardiovascular:  Hum of LVAD auscultated  Respiratory: Breath sounds normal. No respiratory distress. Slight decrease in sounds on the right.  GI: Soft. Bowel sounds are normal. He exhibits no distension.  Lymphadenopathy:  He has no cervical adenopathy.  Neurological: He is alert and oriented to person, place, and time.  Motor 5/5 UE prox to distal, LE 4- HF, 4 KE, ankles 4+. Fatigues quickly. No gross sensory findings. CN exam intact. Good insight and awareness. Normal memory. DTR's 1+  Skin:  chest tube sites clean and dry, dressed with minimal drainage. Psychiatric: He has a normal mood and affect. His behavior is normal. Judgment and thought content normal   Assessment/Plan: 1. Functional deficits  secondary to deconditioning after hemothorax/VATS which require 3+ hours per day of interdisciplinary therapy in a comprehensive inpatient rehab setting. Physiatrist is providing close team supervision and 24 hour management of active medical problems listed below. Physiatrist and rehab team continue to assess barriers to discharge/monitor patient progress toward functional and medical goals. FIM: FIM - Bathing Bathing Steps Patient Completed: Chest;Right Arm;Left Arm;Abdomen;Right upper leg;Left upper leg;Left lower leg (including foot);Right lower leg (including foot) Bathing: 4: Min-Patient completes 8-9 12f 10 parts or 75+ percent  FIM - Upper Body Dressing/Undressing Upper body dressing/undressing steps patient completed: Thread/unthread right sleeve of pullover shirt/dresss;Thread/unthread left sleeve of pullover shirt/dress;Put head through opening of pull over shirt/dress;Pull shirt over trunk Upper body dressing/undressing: 4: Min-Patient completed 75 plus % of tasks FIM - Lower Body Dressing/Undressing Lower body dressing/undressing steps patient completed: Thread/unthread right underwear leg;Thread/unthread left underwear leg;Pull underwear up/down;Thread/unthread right pants leg;Pull pants up/down;Don/Doff right sock;Don/Doff left sock;Don/Doff right shoe;Fasten/unfasten right shoe;Fasten/unfasten left shoe Lower body dressing/undressing: 4: Min-Patient completed 75 plus % of tasks  FIM - Toileting Toileting steps completed by patient: Adjust clothing prior to toileting;Performs perineal hygiene;Adjust clothing after toileting Toileting Assistive Devices: Grab bar or rail for support Toileting: 5: Supervision: Safety issues/verbal cues  FIM - Radio producer Devices: Insurance account manager Transfers: 4-To toilet/BSC: Min A (steadying Pt. > 75%);4-From toilet/BSC: Min A (steadying Pt. > 75%)  FIM - Bed/Chair Transfer Bed/Chair Transfer Assistive Devices:  (head  of bed flat) Bed/Chair  Transfer: 4: Bed > Chair or W/C: Min A (steadying Pt. > 75%);4: Chair or W/C > Bed: Min A (steadying Pt. > 75%)  FIM - Locomotion: Wheelchair Distance: 5 feet, utilizing B LEs  Locomotion: Wheelchair: 1: Travels less than 50 ft with supervision, cueing or coaxing FIM - Locomotion: Ambulation Locomotion: Ambulation Assistive Devices: Administrator Ambulation/Gait Assistance: 4: Min assist Locomotion: Ambulation: 4: Travels 150 ft or more with minimal assistance (Pt.>75%)  Comprehension Comprehension Mode: Auditory Comprehension: 7-Follows complex conversation/direction: With no assist  Expression Expression Mode: Verbal Expression: 7-Expresses complex ideas: With no assist  Social Interaction Social Interaction: 6-Interacts appropriately with others with medication or extra time (anti-anxiety, antidepressant).  Problem Solving Problem Solving: 6-Solves complex problems: With extra time  Memory Memory: 7-Complete Independence: No helper Medical Problem List and Plan:  1. deconditioning related to multi-medical, history of right status post VATS as well as LVAD January 2015  2. DVT Prophylaxis/Anticoagulation: Coumadin per pharmacy. Monitor for any bleeding episodes  3. Pain Management: Tylenol as needed  3. Mood/anxiety. Low-dose Xanax as needed.  5. Neuropsych: This patient is capable of making decisions on his own behalf.  6. History of LVAD January 2015. Followup per cardiology services. Pt is competent to help direct mgt of the device as well.  7. Chronic anemia. Latest hemoglobin 8.5. Continue iron supplement  8. Hypertension. lisinopril 2.5 mg daily, Aldactone 12.5 mg daily. MAP remains 90-100 9. Diastolic congestive heart failure. Continue Lasix 40 mg daily.    -check daily weights, follow I's and O's ---67kg today, neg so far 10.[name] Hypothyroidism. Synthroid  11. Wound care: staples out of chest-   LOS (Days) 3 A FACE TO FACE EVALUATION WAS  PERFORMED  Katlin Bortner T 11/20/2013 8:11 AM

## 2013-11-20 NOTE — IPOC Note (Signed)
Overall Plan of Care [facility]) Patient Details Name: Johnny Oneill MRN: [id number] DOB: Jan 18, 1941  Admitting Diagnosis: L VAD Deconditioned  Hospital Problems: Active Problems:   Physical deconditioning     Functional Problem List: Nursing Bowel;Skin Integrity;Medication Management  PT Balance;Endurance  OT Balance;Endurance  SLP    TR         Basic ADL's: OT Grooming;Bathing;Dressing;Toileting     Advanced  ADL's: OT       Transfers: PT Bed Mobility;Bed to Chair;Car  OT Toilet     Locomotion: PT Ambulation;Stairs     Additional Impairments: OT    SLP        TR      Anticipated Outcomes Item Anticipated Outcome  Self Feeding independent  Swallowing      Basic self-care  mod I  Toileting  mod I   Bathroom Transfers mod I  Bowel/Bladder  Mod I  Transfers  mod I for transfers  Locomotion  mod I for ambulation, S for stairs  Communication     Cognition     Pain  Less than 3  Safety/Judgment  Independent   Therapy Plan: PT Intensity: Minimum of 1-2 x/day ,45 to 90 minutes PT Frequency: 5 out of 7 days PT Duration Estimated Length of Stay: 5 to 7 days OT Intensity: Minimum of 1-2 x/day, 45 to 90 minutes OT Frequency: 5 out of 7 days OT Duration/Estimated Length of Stay: 7-9 days         Team Interventions: Nursing Interventions Patient/Family Education;Disease Management/Prevention;Skin Care/Wound Management;Bowel Management;Medication Management  PT interventions Ambulation/gait training;Balance/vestibular training;Discharge planning;DME/adaptive equipment instruction;Functional mobility training;Patient/family education;Therapeutic Exercise;UE/LE Coordination activities;UE/LE Strength taining/ROM;Stair training;Neuromuscular re-education;Therapeutic Activities  OT Interventions Balance/vestibular training;Discharge planning;DME/adaptive equipment instruction;Functional mobility training;Patient/family education;Psychosocial support;Self  Care/advanced ADL retraining;Therapeutic Activities;Therapeutic Exercise  SLP Interventions    TR Interventions    SW/CM Interventions      Team Discharge Planning: Destination: PT-Home ,OT- Home , SLP-  Projected Follow-up: PT-Home health PT, OT-  None, SLP-  Projected Equipment Needs: PT-To be determined, OT- To be determined, SLP-  Equipment Details: PT- , OT-  Patient/family involved in discharge planning: PT- Patient,  OT-Patient;Family member/caregiver, SLP-   MD ELOS: 6-7 days Medical Rehab Prognosis:  Excellent Assessment: The patient has been admitted for CIR therapies. The team will be addressing, functional mobility, strength, stamina, balance, safety, adaptive techniques/equipment, self-care, bowel and bladder mgt, patient and caregiver education, LVAD maintenance, wound care, CV tolerance and observation of parameters, coping skills. Goals have been set at Johnny Lope, MD, [facility]      See Team Conference Notes for weekly updates to the plan of care

## 2013-11-20 NOTE — Progress Notes (Addendum)
Advanced Heart Failure Team Rounding Note     Subjective:    Johnny Oneill is a 73 year old patient with a history of CAD and HF s/p LVAD HMII 09/19/2013 recently admitted with SBO. Hospitalization complicated by R hemothorax, PEA arrest and VDRF. Transferred to CIR 3/20  Doing well. Working with rehab. Tolelrating diet. INR up to 2.5. Heparin off. Added back lisinopril yesterday for HTN.   MAPs 90-95. He is happy that he and I are 3rd in the [employer] pool.   LVAD INTERROGATION:  HeartMate II LVAD: Flow 4.5 liters/min, speed 9400, power 6.8 PI 5.4; 2 PI events   Physical Exam: GENERAL: Sittingin bed. No distress HEENT: normal   NECK: Supple, JVP 8 prominent CV waves CARDIAC: LVAD hum present.  LUNGS: clear. Mildly decreased on R ABDOMEN: soft nontender,  Bowel sounds hypoactive LVAD exit site: well-healed and incorporated. Dressing dry and intact. No erythema or drainage. Stabilization device present and accurately applied. Driveline dressing is being changed daily per sterile technique.  EXTREMITIES: Warm and dry, no cyanosis, clubbing, rash or edema  NEUROLOGIC: awake non-focal.  Telemetry: SR   Labs: Basic Metabolic Panel:  Recent Labs Lab 11/14/13 0511 11/15/13 0600 11/16/13 0510 11/17/13 0550 11/20/13 0733  NA 138 139 138 140 139  K 3.9 3.7 3.4* 3.8 3.1*  CL 100 100 100 103 104  CO2 28 26 25 24 22   GLUCOSE 125* 124* 104* 88 87  BUN 24* 21 18 17 13   CREATININE 0.90 0.85 0.88 0.99 0.97  CALCIUM 8.2* 8.5 8.4 8.3* 8.3*  MG  --   --  2.1  --   --   PHOS  --   --  3.6  --   --     Liver Function Tests:  Recent Labs Lab 11/14/13 0511 11/15/13 0600 11/16/13 0510 11/17/13 0550 11/20/13 0733  AST 26 29 24 24 20   ALT 35 38 32 31 23  ALKPHOS 124* 145* 151* 146* 122*  BILITOT 1.2 0.9 0.8 0.8 0.7  PROT 6.0 6.1 6.5 6.5 6.4  ALBUMIN 2.2* 2.3* 2.4* 2.5* 2.5*   No results found for this basename: LIPASE, AMYLASE,  in the last 168 hours No results found for this  basename: AMMONIA,  in the last 168 hours  CBC:  Recent Labs Lab 11/15/13 0600 11/16/13 0510 11/18/13 0529 11/19/13 0550 11/20/13 0733  WBC 7.7 7.6 5.5 5.3 4.7  NEUTROABS  --   --   --   --  3.4  HGB 8.4* 8.5* 8.3* 8.5* 8.9*  HCT 26.2* 26.3* 26.0* 26.2* 28.0*  MCV 90.3 90.7 90.3 90.0 91.2  PLT 241 272 241 246 239    Cardiac Enzymes: No results found for this basename: CKTOTAL, CKMB, CKMBINDEX, TROPONINI,  in the last 168 hours  BNP: BNP (last 3 results)  Recent Labs  09/13/13 1349 09/25/13 0400 11/01/13 1033  PROBNP 3883.0* 7019.0* 877.6*    CBG:  Recent Labs Lab 11/16/13 1112 11/16/13 1200 11/16/13 1555 11/16/13 2000 11/17/13 0710  GLUCAP 103* 100* 106* 109* 88    Coagulation Studies:  Recent Labs  11/18/13 0529 11/19/13 0550 11/20/13 0733  LABPROT 14.6 17.8* 19.2*  INR 1.16 1.51* 1.67*    Other results:   Imaging: No results found.   Assessment:   1. Hemorrhagic shock due to R hemothorax    --s/p R chest tube 3/8 2. 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 CAD 4. Hypothyroidism 5. SBO 6. Acute blood loss anemia 7. PEA arrest 8.  Shock liver 9. A/C renal failure, stage III -resolved 10. Ventilatory-dependent respiratory failure 11. Hypokalemia  Plan/Discussion:     Doing well. Continues to progress with activity - riding bike, etc.. Appetite good. Appreciate CIR's care  Volume status looks good with spiro. Will stop lasix for now.  MAP improved. INR now therapeutic. Appreciate Pharmacy's help. VAD parameters look good. Will supp K+.  Hopefully home by end of week.   Chancie Lampert,MD 9:07 AM

## 2013-11-21 ENCOUNTER — Inpatient Hospital Stay (HOSPITAL_COMMUNITY): Payer: Medicare Other | Admitting: Physical Therapy

## 2013-11-21 ENCOUNTER — Inpatient Hospital Stay (HOSPITAL_COMMUNITY): Payer: Medicare Other

## 2013-11-21 ENCOUNTER — Inpatient Hospital Stay (HOSPITAL_COMMUNITY): Payer: Medicare Other | Admitting: Occupational Therapy

## 2013-11-21 DIAGNOSIS — I2510 Atherosclerotic heart disease of native coronary artery without angina pectoris: Secondary | ICD-10-CM

## 2013-11-21 DIAGNOSIS — R5381 Other malaise: Secondary | ICD-10-CM

## 2013-11-21 DIAGNOSIS — I5022 Chronic systolic (congestive) heart failure: Secondary | ICD-10-CM

## 2013-11-21 LAB — CBC
HCT: 27.9 % — ABNORMAL LOW (ref 39.0–52.0)
Hemoglobin: 8.9 g/dL — ABNORMAL LOW (ref 13.0–17.0)
MCH: 28.9 pg (ref 26.0–34.0)
MCHC: 31.9 g/dL (ref 30.0–36.0)
MCV: 90.6 fL (ref 78.0–100.0)
Platelets: 222 10*3/uL (ref 150–400)
RBC: 3.08 MIL/uL — ABNORMAL LOW (ref 4.22–5.81)
RDW: 18.1 % — ABNORMAL HIGH (ref 11.5–15.5)
WBC: 4.5 10*3/uL (ref 4.0–10.5)

## 2013-11-21 LAB — APTT: aPTT: 148 seconds — ABNORMAL HIGH (ref 24–37)

## 2013-11-21 LAB — PROTIME-INR
INR: 1.85 — ABNORMAL HIGH (ref 0.00–1.49)
Prothrombin Time: 20.8 seconds — ABNORMAL HIGH (ref 11.6–15.2)

## 2013-11-21 LAB — HEPARIN LEVEL (UNFRACTIONATED): Heparin Unfractionated: 0.15 IU/mL — ABNORMAL LOW (ref 0.30–0.70)

## 2013-11-21 MED ORDER — HYDRALAZINE HCL 20 MG/ML IJ SOLN
10.0000 mg | Freq: Once | INTRAMUSCULAR | Status: AC
  Administered 2013-11-21: 10 mg via INTRAVENOUS
  Filled 2013-11-21: qty 0.5

## 2013-11-21 MED ORDER — TEMAZEPAM 7.5 MG PO CAPS
15.0000 mg | ORAL_CAPSULE | Freq: Every day | ORAL | Status: DC
  Administered 2013-11-21: 15 mg via ORAL
  Filled 2013-11-21: qty 2

## 2013-11-21 MED ORDER — WARFARIN SODIUM 7.5 MG PO TABS
7.5000 mg | ORAL_TABLET | Freq: Once | ORAL | Status: AC
  Administered 2013-11-21: 7.5 mg via ORAL
  Filled 2013-11-21: qty 1

## 2013-11-21 MED ORDER — ACETAMINOPHEN 325 MG PO TABS
325.0000 mg | ORAL_TABLET | ORAL | Status: DC | PRN
  Administered 2013-11-21: 325 mg via ORAL
  Filled 2013-11-21: qty 1

## 2013-11-21 NOTE — Progress Notes (Signed)
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 Patient developed cardiac arrest from right hemo-thorax tension on right heart and was intubated with ventilator-dependent respiratory failure for 7 days. Now extubated, doing well. All right pleural catheters and drains have been removed without evidence of recurrent hemothorax. The patient remains on IV heparin while INR is slowly rising with reinitiation of Coumadin- INR approaching therapeutic 1.85  Patient's symptoms of small bowel obstruction have resolved and is on a regular diet with normal bowel movements.He will take colace long term at home. Patient is ambulating and receiving physical therapy and occupational therapy in the inpatient rehabilitation unit--appreciate excellent care.Hope to DC home when off IV heparin.  The patient's weight has been stable there is no peripheral edema. His mean arterial pressures have been slightly high and blood pressure medication has been adjusted   LVAD INTERROGATION HeartMate II LVAD:  Flow 4.4 liters/min, speed 9400 RPM, power 5.7, PI 5.2  0 PI event   Objective:    Vital Signs:   Temp:  [97.5 F (36.4 C)-98.3 F (36.8 C)] 98.1 F (36.7 C) (03/24 0520) Pulse Rate:  [77-84] 77 (03/24 0520) Resp:  [17-18] 17 (03/24 0520) SpO2:  [95 %-98 %] 95 % (03/24 0520) Weight:  [144 lb 13.5 oz (65.7 kg)] 144 lb 13.5 oz (65.7 kg) (03/24 0520) Last BM Date: 11/19/13 Mean arterial Pressure  82-90 mm Hg Intake/Output:   Intake/Output Summary (Last 24 hours) at 11/21/13 1056 Last data filed at 11/21/13 0800  Gross per 24 hour  Intake    960 ml  Output    350 ml  Net    610 ml     Physical  Exam: General:  Well appearing. extubated and responsive, breathing comfortably  HEENT: normal Neck: supple. Mild JVD; no bruits. No lymphadenopathy or thryomegaly appreciated. Cor: Mechanical heart sounds with LVAD hum present. Lungs: clear, right VATS and Chest tube incisions clean and dry- will remove chest tube sutures Abdomen: soft, nontender, mildly distended. No hepatosplenomegaly. No bruits or masses.  Drive line     dressing dry and intact, bowel sounds  normal. Driveline: C/D/I; securement device intact and driveline incorporated Extremities: no cyanosis, clubbing, rash, mild edema, extremities warm  Neuro: Intact moves all extremities - walking long distances with PT Telemetry: sinus rhythm  Labs: Basic Metabolic Panel:  Recent Labs Lab 11/15/13 0600 11/16/13 0510 11/17/13 0550 11/20/13 0733  NA 139 138 140 139  K 3.7 3.4* 3.8 3.1*  CL 100 100 103 104  CO2 26 25 24 22   GLUCOSE 124* 104* 88 87  BUN 21 18 17 13   CREATININE 0.85 0.88 0.99 0.97  CALCIUM 8.5 8.4 8.3* 8.3*  MG  --  2.1  --   --   PHOS  --  3.6  --   --     Liver Function Tests:  Recent Labs Lab 11/15/13 0600 11/16/13 0510 11/17/13 0550 11/20/13 0733  AST 29 24 24 20   ALT 38 32 31 23  ALKPHOS 145* 151* 146* 122*  BILITOT 0.9 0.8 0.8 0.7  PROT 6.1 6.5 6.5 6.4  ALBUMIN 2.3* 2.4* 2.5* 2.5*   No results found for this basename: LIPASE, AMYLASE,  in the last 168 hours No results found for this basename:  AMMONIA,  in the last 168 hours  CBC:  Recent Labs Lab 11/16/13 0510 11/18/13 0529 11/19/13 0550 11/20/13 0733 11/21/13 0603  WBC 7.6 5.5 5.3 4.7 4.5  NEUTROABS  --   --   --  3.4  --   HGB 8.5* 8.3* 8.5* 8.9* 8.9*  HCT 26.3* 26.0* 26.2* 28.0* 27.9*  MCV 90.7 90.3 90.0 91.2 90.6  PLT 272 241 246 239 222    INR:  Recent Labs Lab 11/17/13 0550 11/18/13 0529 11/19/13 0550 11/20/13 0733 11/21/13 0603  INR 1.34 1.16 1.51* 1.67* 1.85*    Other results:  EKG:   Imaging: No  results found.   Medications:     Scheduled Medications: . antiseptic oral rinse  15 mL Mouth Rinse Q4H  . aspirin EC  325 mg Oral Daily  . budesonide (PULMICORT) nebulizer solution  0.25 mg Nebulization BID  . citalopram  10 mg Oral Daily  . feeding supplement (ENSURE COMPLETE)  237 mL Oral BID BM  . ferrous sulfate  325 mg Oral TID WC  . levothyroxine  200 mcg Oral QAC breakfast  . lisinopril  10 mg Oral Daily  . pantoprazole  40 mg Oral Daily  . spironolactone  12.5 mg Oral Daily  . temazepam  15 mg Oral QHS  . Warfarin - Pharmacist Dosing Inpatient   Does not apply q1800    Infusions: . heparin 1,650 Units/hr (11/21/13 0250)    PRN Medications: acetaminophen, diphenhydrAMINE, ondansetron (ZOFRAN) IV, ondansetron, sorbitol   Assessment:PLAN   Cont heparin until INR 2.0-  po coumadin at 7.5 daily  Systemic anticoagulation with heparin, per pharm D  Continue PT,appreciate expert rehab therapy by CIR team.  Drive line dressing changes per protocol-Monday Wednesday Friday schedule  No evidence recurrent small bowel obstruction - hopefully patient will be ready for discharge later this week      I reviewed the LVAD parameters from today, and compared the results to the patient's prior recorded data.  No programming changes were made.  The LVAD is functioning within specified parameters.  The patient performs LVAD self-test daily.  LVAD interrogation was negative for any significant power changes, alarms or PI events/speed drops.  LVAD equipment check completed and is in good working order.  Back-up equipment present.   LVAD education done on emergency procedures and precautions and reviewed exit site care.  Length of Stay: 4  VAN TRIGT III,Burech Mcfarland 11/21/2013, 10:56 AM  VAD Team Pager [phone] (7am - 7am)

## 2013-11-21 NOTE — Progress Notes (Signed)
Advanced Heart Failure Team Rounding Note     Subjective:    Johnny Oneill is a 73 year old patient with a history of CAD and HF s/p LVAD HMII 09/19/2013 recently admitted with SBO. Hospitalization complicated by R hemothorax, PEA arrest and VDRF. Transferred to CIR 3/20  Feels great. Working with rehab. Wants to go home and do rehab at home Tolelrating diet.  Increased lisinopril yesterday for HTN.   MAPs 85-90.   LVAD INTERROGATION:  HeartMate II LVAD: Flow 4.6 liters/min, speed 9400, power 6.3 PI 5.2; 0 PI events   Physical Exam: GENERAL: Sittingin bed. No distress HEENT: normal   NECK: Supple, JVP 8 prominent CV waves CARDIAC: LVAD hum present.  LUNGS: clear. Mildly decreased on R ABDOMEN: soft nontender,  Bowel sounds hypoactive LVAD exit site: well-healed and incorporated. Dressing dry and intact. No erythema or drainage. Stabilization device present and accurately applied. Driveline dressing is being changed daily per sterile technique.  EXTREMITIES: Warm and dry, no cyanosis, clubbing, rash or edema  NEUROLOGIC: awake non-focal.  Telemetry: SR   Labs: Basic Metabolic Panel:  Recent Labs Lab 11/15/13 0600 11/16/13 0510 11/17/13 0550 11/20/13 0733  NA 139 138 140 139  K 3.7 3.4* 3.8 3.1*  CL 100 100 103 104  CO2 26 25 24 22   GLUCOSE 124* 104* 88 87  BUN 21 18 17 13   CREATININE 0.85 0.88 0.99 0.97  CALCIUM 8.5 8.4 8.3* 8.3*  MG  --  2.1  --   --   PHOS  --  3.6  --   --     Liver Function Tests:  Recent Labs Lab 11/15/13 0600 11/16/13 0510 11/17/13 0550 11/20/13 0733  AST 29 24 24 20   ALT 38 32 31 23  ALKPHOS 145* 151* 146* 122*  BILITOT 0.9 0.8 0.8 0.7  PROT 6.1 6.5 6.5 6.4  ALBUMIN 2.3* 2.4* 2.5* 2.5*   No results found for this basename: LIPASE, AMYLASE,  in the last 168 hours No results found for this basename: AMMONIA,  in the last 168 hours  CBC:  Recent Labs Lab 11/15/13 0600 11/16/13 0510 11/18/13 0529 11/19/13 0550 11/20/13 0733   WBC 7.7 7.6 5.5 5.3 4.7  NEUTROABS  --   --   --   --  3.4  HGB 8.4* 8.5* 8.3* 8.5* 8.9*  HCT 26.2* 26.3* 26.0* 26.2* 28.0*  MCV 90.3 90.7 90.3 90.0 91.2  PLT 241 272 241 246 239    Cardiac Enzymes: No results found for this basename: CKTOTAL, CKMB, CKMBINDEX, TROPONINI,  in the last 168 hours  BNP: BNP (last 3 results)  Recent Labs  09/13/13 1349 09/25/13 0400 11/01/13 1033  PROBNP 3883.0* 7019.0* 877.6*    CBG:  Recent Labs Lab 11/16/13 1200 11/16/13 1555 11/16/13 2000 11/16/13 2357 11/17/13 0710  GLUCAP 100* 106* 109* 108* 88    Coagulation Studies:  Recent Labs  11/19/13 0550 11/20/13 0733  LABPROT 17.8* 19.2*  INR 1.51* 1.67*    Other results:   Imaging: No results found.   Assessment:   1. Hemorrhagic shock due to R hemothorax    --s/p R chest tube 3/8 2. 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 CAD 4. Hypothyroidism 5. SBO 6. Acute blood loss anemia 7. PEA arrest 8. Shock liver 9. A/C renal failure, stage III -resolved 10. Ventilatory-dependent respiratory failure 11. Hypokalemia  Plan/Discussion:     Doing well. Continues to progress with activity - riding bike, etc.. Appetite good. Appreciate CIR's care  Volume  status looks good with spiro. Lasix stopped yesterday.  MAP improved. Await INR. Appreciate Pharmacy's help. VAD parameters look good.   Possibly home soon once INR therapeutic and OK from Rehab perspective.    Daniel Bensimhon,MD 6:09 AM

## 2013-11-21 NOTE — Progress Notes (Signed)
Johnny Oneill PHYSICAL MEDICINE & REHABILITATION     PROGRESS NOTE    Subjective/Complaints: No complaints. Doing well with therapy. Anxious to get home A 12 point review of systems has been performed and if not noted above is otherwise negative.   Objective: Vital Signs: Blood pressure 115/78, pulse 77, temperature 98.1 F (36.7 C), temperature source Oral, resp. rate 17, height 5\' 7"  (1.702 m), weight 65.7 kg (144 lb 13.5 oz), SpO2 95.00%. No results found.  Recent Labs  11/20/13 0733 11/21/13 0603  WBC 4.7 4.5  HGB 8.9* 8.9*  HCT 28.0* 27.9*  PLT 239 222    Recent Labs  11/20/13 0733  NA 139  K 3.1*  CL 104  GLUCOSE 87  BUN 13  CREATININE 0.97  CALCIUM 8.3*   CBG (last 3)  No results found for this basename: GLUCAP,  in the last 72 hours  Wt Readings from Last 3 Encounters:  11/21/13 65.7 kg (144 lb 13.5 oz)  11/16/13 72.2 kg (159 lb 2.8 oz)  11/16/13 72.2 kg (159 lb 2.8 oz)    Physical Exam:  Constitutional: He is oriented to person, place, and time. He appears well-developed.  No distress HENT:  Head: Normocephalic and atraumatic.  Eyes: EOM are normal.  Neck: Normal range of motion. Neck supple. No JVD present. No tracheal deviation present. No thyromegaly present.  Cardiovascular:  Hum of LVAD auscultated  Respiratory: Breath sounds normal. No respiratory distress. Slight decrease in sounds on the right.  GI: Soft. Bowel sounds are normal. He exhibits no distension.  Lymphadenopathy:  He has no cervical adenopathy.  Neurological: He is alert and oriented to person, place, and time.  Motor 5/5 UE prox to distal, LE 4- HF, 4 KE, ankles 4+. Fatigues quickly. No gross sensory findings. CN exam intact. Good insight and awareness. Normal memory. DTR's 1+  Skin:  chest tube sites clean and dry, dressed with minimal drainage. Psychiatric: He has a normal mood and affect. His behavior is normal. Judgment and thought content normal   Assessment/Plan: 1.  Functional deficits secondary to deconditioning after hemothorax/VATS which require 3+ hours per day of interdisciplinary therapy in a comprehensive inpatient rehab setting. Physiatrist is providing close team supervision and 24 hour management of active medical problems listed below. Physiatrist and rehab team continue to assess barriers to discharge/monitor patient progress toward functional and medical goals. FIM: FIM - Bathing Bathing Steps Patient Completed: Chest;Right Arm;Left Arm;Abdomen;Right upper leg;Left upper leg;Left lower leg (including foot);Right lower leg (including foot) Bathing: 4: Min-Patient completes 8-9 17f 10 parts or 75+ percent  FIM - Upper Body Dressing/Undressing Upper body dressing/undressing steps patient completed: Thread/unthread right sleeve of pullover shirt/dresss;Thread/unthread left sleeve of pullover shirt/dress;Put head through opening of pull over shirt/dress;Pull shirt over trunk Upper body dressing/undressing: 4: Min-Patient completed 75 plus % of tasks FIM - Lower Body Dressing/Undressing Lower body dressing/undressing steps patient completed: Thread/unthread right underwear leg;Thread/unthread left underwear leg;Pull underwear up/down;Thread/unthread right pants leg;Pull pants up/down;Don/Doff right sock;Don/Doff left sock;Don/Doff right shoe;Fasten/unfasten right shoe;Fasten/unfasten left shoe Lower body dressing/undressing: 4: Min-Patient completed 75 plus % of tasks  FIM - Toileting Toileting steps completed by patient: Adjust clothing prior to toileting;Performs perineal hygiene;Adjust clothing after toileting Toileting Assistive Devices: Grab bar or rail for support Toileting: 5: Supervision: Safety issues/verbal cues  FIM - Radio producer Devices: Insurance account manager Transfers: 4-To toilet/BSC: Min A (steadying Pt. > 75%);4-From toilet/BSC: Min A (steadying Pt. > 75%)  FIM - Engineer, site  Assistive Devices:  (head of bed flat) Bed/Chair Transfer: 4: Bed > Chair or W/C: Min A (steadying Pt. > 75%);4: Chair or W/C > Bed: Min A (steadying Pt. > 75%)  FIM - Locomotion: Wheelchair Distance: 5 feet, utilizing B LEs  Locomotion: Wheelchair: 1: Travels less than 50 ft with supervision, cueing or coaxing FIM - Locomotion: Ambulation Locomotion: Ambulation Assistive Devices: Administrator Ambulation/Gait Assistance: 4: Min assist Locomotion: Ambulation: 4: Travels 150 ft or more with minimal assistance (Pt.>75%)  Comprehension Comprehension Mode: Auditory Comprehension: 7-Follows complex conversation/direction: With no assist  Expression Expression Mode: Verbal Expression: 7-Expresses complex ideas: With no assist  Social Interaction Social Interaction: 6-Interacts appropriately with others with medication or extra time (anti-anxiety, antidepressant).  Problem Solving Problem Solving: 6-Solves complex problems: With extra time  Memory Memory: 7-Complete Independence: No helper  Medical Problem List and Plan:  1. deconditioning related to multi-medical, history of right status post VATS as well as LVAD January 2015  2. DVT Prophylaxis/Anticoagulation: Coumadin per pharmacy. Monitor for any bleeding episodes  3. Pain Management: Tylenol as needed  3. Mood/anxiety. Low-dose Xanax as needed.  5. Neuropsych: This patient is capable of making decisions on his own behalf.  6. History of LVAD January 2015. Followup per cardiology services. Pt is competent to help direct mgt of the device as well.  7. Chronic anemia. Latest hemoglobin 8.5. Continue iron supplement  8. Hypertension. lisinopril 2.5 mg daily, Aldactone 12.5 mg daily. MAP remains 90-100 9. Diastolic congestive heart failure. Continue Lasix 40 mg daily.    -check daily weights, follow I's and O's ---67kg today, neg so far 10.[name] Hypothyroidism. Synthroid  11. Wound care: staples out of chest-   LOS (Days) 4 A  FACE TO FACE EVALUATION WAS PERFORMED  Johnny Oneill 11/21/2013 8:24 AM

## 2013-11-21 NOTE — Progress Notes (Signed)
Physical Therapy Note  Patient Details  Name: Johnny Oneill MRN: [id number] Date of Birth: [birth year] Today's Date: 11/21/2013  Time: 1300-1330 30 minutes  1:1 No c/o pain.  Gait with rollator with wife providing close supervision x 200'.  Car transfer with wife providing supervision assist.  [place] HEP with frequent rest breaks due to fatigue, pt/wife able to follow written HEP with cues.   [name] 11/21/2013, 2:10 PM

## 2013-11-21 NOTE — Progress Notes (Signed)
Patient MAP 100. Patient alert and oriented X 4.Paitent asymptomatic with no complaints of pain or discomfort. No signs and symptoms of respiratory distress. No SOB. Rapid response and  Reesa Chew, PA called advised to call the Cardiologist. Mallie Mussel Cardiologist awaiting for response.

## 2013-11-21 NOTE — Progress Notes (Signed)
Physical Therapy Session Note  Patient Details  Name: VICTORIOUS KIMERY MRN: [id number] Date of Birth: Apr 19, 1941  Today's Date: 11/21/2013 Time: 1030-1130 Time Calculation (min): 60 min  Short Term Goals: Week 1:  PT Short Term Goal 1 (Week 1): STG = LTG  Skilled Therapeutic Interventions/Progress Updates:    Only minimal lightheadedness once during session. RN present and took BP of 109. [place] HEP started, pt practiced with PT providing min cues for safety. Wife arrived half-way through session and family education initiated. Focused on stair training, management of rollator walker with steps, and caregiver positioning. Pt does not have rails and requires HHA for descent. Practiced 2 steps x 6 bouts - pt and wife will benefit from continued practice. Ambulation 2 x 150' with rollator walker supervision, primarily for management of IV. Pt mod I for transfers.    Therapy Documentation Precautions:  Precautions Precautions: Fall Precaution Comments: lightheadedness with position changes Restrictions Weight Bearing Restrictions: No Other Position/Activity Restrictions: sternal precautions Pain: Pain Assessment Pain Score: 0-No pain  See FIM for current functional status  Therapy/Group: Individual Therapy  Lahoma Rocker 11/21/2013, 12:28 PM

## 2013-11-21 NOTE — Progress Notes (Signed)
Occupational Therapy Session Note  Patient Details  Name: Johnny Oneill MRN: [id number] Date of Birth: 02/06/1941  Today's Date: 11/21/2013 Time: [id number] Time Calculation (min): 27 min  Skilled Therapeutic Interventions/Progress Updates:    Pt ambulated down to the gym with supervision using the rollator.  In the gym worked on sit to stand for 3 sets of 10 reps to increase LE strengthening and efficiency.  Pt also performed step ups onto 5 inch platform alternating LEs.  Initially pt began needing min assist for task but progressed to supervision level.  Pt demonstrating increased difficulty with stepping up with the LLE as well.  Finished session by working on alternate tapping of each foot on the stool as well.  He needed close supervision for task.  O2 sats during session 93-95% with HR no higher than 103 BPM.  Ambulated back to his room with supervision at end of session.   Therapy Documentation Precautions:  Precautions Precautions: Fall Precaution Comments: lightheadedness with position changes Restrictions Weight Bearing Restrictions: No Other Position/Activity Restrictions: sternal precautions  Pain: Pain Assessment Pain Assessment: No/denies pain ADL: See FIM for current functional status  Therapy/Group: Individual Therapy  Josephine Rudnick,[place] OTR/L 11/21/2013, 4:50 PM

## 2013-11-21 NOTE — Progress Notes (Signed)
ANTICOAGULATION CONSULT NOTE - Follow-Up Consult  Pharmacy Consult for Heparin/ Coumadin Indication: LVAD  Allergies  Allergen Reactions  . Diovan [Valsartan] Other (See Comments)    Hypotension at low dose, hyperkalemia  . Lipitor [Atorvastatin Calcium] Other (See Comments)    Muscle pain    Patient Measurements: Height: 5\' 7"  (170.2 cm) Weight: 144 lb 13.5 oz (65.7 kg) (Including doppler!) IBW/kg (Calculated) : 66.1  Vital Signs: Temp: 98.1 F (36.7 C) (03/24 0520) Temp src: Oral (03/24 0520) Pulse Rate: 77 (03/24 0520)  Labs:  Recent Labs  11/19/13 0550  11/20/13 0733 11/20/13 1205 11/21/13 0603  HGB 8.5*  --  8.9*  --  8.9*  HCT 26.2*  --  28.0*  --  27.9*  PLT 246  --  239  --  222  APTT 83*  --  174*  --  148*  LABPROT 17.8*  --  19.2*  --  20.8*  INR 1.51*  --  1.67*  --  1.85*  HEPARINUNFRC 0.14*  < > 0.40 0.23* 0.15*  CREATININE  --   --  0.97  --   --   < > = values in this interval not displayed.  Estimated Creatinine Clearance: 65.9 ml/min (by C-G formula based on Cr of 0.97).  . 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  Admitted 3/4 with SBO/ileus, and Coumadin was held in setting of R hemothorax, s/p chest tube and multiple units transfusion.  Also had R chest drain placed on 3/13 removed 3/18.   Heparin level 0.15 subtherapeutic despite aPTT 148 sec- which is affected by many factors including rising INR - on heparin drip 1650 units/hr.  Lab results were discussed with Dr Prescott Gum.  Heparin rate will not be uptitrated as INR 1.85 is very close to goal and pt is still at increased risk of bleeding.  CBC stable.     Goal of Therapy:  INR 2-3 Heparin level goal 0.2-0.4 (per MD) Monitor platelets by anticoagulation protocol: Yes   Plan:  1. Continue IV heparin at 1650 units/hr. 3. Heparin level and CBC daily. 4. Coumadin 7.5 mg po x 1 tonight. 5. Continue daily  PT/INR.     Bonnita Nasuti Pharm.D. CPP, BCPS Clinical Pharmacist [phone] 11/21/2013 10:54 AM

## 2013-11-21 NOTE — Progress Notes (Signed)
Rechecked MAP 110 as per  Reesa Chew, PA.[name] Patient alert and oriented X 4. Resting quietly at this time. Domenick Bookbinder Cardiologist notified. Dr. Clancy Gourd . [place], Cardiologist new order: Hydralazine 10 mg IV Will continue to monitor.

## 2013-11-21 NOTE — Progress Notes (Signed)
Called by bedside RN regarding MAP of 100, patient's MAP earlier in the day was 106. Patient alert, oriented, skin warm and dry, and has no complaints. Patient denies increased WOB or SOB. LVAD running within parameters. Advised bedside RN to call MD with concerns. Will continue to monitor, advised bedside RN to call with further needs

## 2013-11-21 NOTE — Progress Notes (Signed)
Occupational Therapy Session Note  Patient Details  Name: Johnny Oneill MRN: [id number] Date of Birth: May 30, 1941  Today's Date: 11/21/2013 Time: 0930-1015 Time Calculation (min): 45 min  Short Term Goals: Week 1:  OT Short Term Goal 1 (Week 1): STG = LTGs due to ELOS  Skilled Therapeutic Interventions/Progress Updates: ADL-retraining with focus on toileting, transfers, and dynamic standing balance.  Patient received in his room while he was completing toileting in bathroom.   Patient completed toileting to include hygiene and clothing management (standing), without need for grab bar or RW.   After using rollator provided to walk to sink, patient performed routine hand hygiene standing at sink and requested continued training on use of Nintendo Wii and balance board as preferred HEP.   Patient ambulated to ortho gym (approx 200') using RW with supervision to negotiate obstacles in highly distracting environment.   Patient required 1 rest break and performed intermediate level Wii balance activity with min guard assist while standing and performing weight-shifting.   Patient demo'd ability to self-monitor symptoms of fatigue throughout activity and rested as needed, 2 more times, 2-3 minutes each time.  After completing final re-ed on HEP, patient rested again approx 3 minutes and ambulated back to his room, recovering in his recliner with call light within reach.    Therapy Documentation Precautions:  Precautions Precautions: Fall Precaution Comments: lightheadedness with position changes Restrictions Weight Bearing Restrictions: No Other Position/Activity Restrictions: sternal precautions  Pain: Pain Assessment Pain Score: 0-No pain  See FIM for current functional status  Therapy/Group: Individual Therapy  Second session: Time: 1330-1400 Time Calculation (min):  30 min  Pain Assessment: No pain  Skilled Therapeutic Interventions: Therapeutic activity with focus on family ed and  training on assistance required/recommended for ADL (rating patient as modified independent with intermittent supervision for symptoms of fatigue).   Spouse [name] present for session which included review of patient's occasional impulsivity and difficulty managing belt/pants and his inattention to objects on his right while ambulating.   Patient also expressed desire for OT to provide introductory training to spouse on use of Nintendo Wii as recommended HEP for dynamic standing balance.  OT educated spouse and patient on use of Wii and potential risk for fall as well as related safety precautions while using Wii balance board.   Patient performed Wii balance activity with spouse observing and ambulated back to room, demonstrating mild inattention to objects on his right (bumping corners) in highly distracting environment.  See FIM for current functional status  Therapy/Group: Individual Therapy  Bacon 11/21/2013, 12:21 PM

## 2013-11-21 NOTE — Plan of Care (Signed)
Problem: RH SKIN INTEGRITY Goal: RH STG ABLE TO PERFORM INCISION/WOUND CARE W/ASSISTANCE STG Able To Perform Incision/Wound Care With total Assistance.  Outcome: Progressing Wife providing daily dressing changes

## 2013-11-22 ENCOUNTER — Inpatient Hospital Stay (HOSPITAL_COMMUNITY): Payer: Medicare Other | Admitting: Physical Therapy

## 2013-11-22 DIAGNOSIS — I5022 Chronic systolic (congestive) heart failure: Secondary | ICD-10-CM

## 2013-11-22 DIAGNOSIS — R5381 Other malaise: Secondary | ICD-10-CM

## 2013-11-22 DIAGNOSIS — I2510 Atherosclerotic heart disease of native coronary artery without angina pectoris: Secondary | ICD-10-CM

## 2013-11-22 LAB — BASIC METABOLIC PANEL
BUN: 10 mg/dL (ref 6–23)
CO2: 19 mEq/L (ref 19–32)
Calcium: 8.3 mg/dL — ABNORMAL LOW (ref 8.4–10.5)
Chloride: 104 mEq/L (ref 96–112)
Creatinine, Ser: 0.95 mg/dL (ref 0.50–1.35)
GFR calc Af Amer: >90 mL/min (ref >90)
GFR calc non Af Amer: 82 mL/min — ABNORMAL LOW (ref >90)
Glucose, Bld: 83 mg/dL (ref 70–99)
Potassium: 3.4 mEq/L — ABNORMAL LOW (ref 3.7–5.3)
Sodium: 138 mEq/L (ref 137–147)

## 2013-11-22 LAB — CBC
HCT: 28.5 % — ABNORMAL LOW (ref 39.0–52.0)
Hemoglobin: 9 g/dL — ABNORMAL LOW (ref 13.0–17.0)
MCH: 28.5 pg (ref 26.0–34.0)
MCHC: 31.6 g/dL (ref 30.0–36.0)
MCV: 90.2 fL (ref 78.0–100.0)
Platelets: 232 10*3/uL (ref 150–400)
RBC: 3.16 MIL/uL — ABNORMAL LOW (ref 4.22–5.81)
RDW: 18.2 % — ABNORMAL HIGH (ref 11.5–15.5)
WBC: 4.7 10*3/uL (ref 4.0–10.5)

## 2013-11-22 LAB — PROTIME-INR
INR: 1.96 — ABNORMAL HIGH (ref 0.00–1.49)
Prothrombin Time: 21.7 seconds — ABNORMAL HIGH (ref 11.6–15.2)

## 2013-11-22 LAB — APTT: aPTT: 177 seconds — ABNORMAL HIGH (ref 24–37)

## 2013-11-22 LAB — HEPARIN LEVEL (UNFRACTIONATED): Heparin Unfractionated: 0.32 IU/mL (ref 0.30–0.70)

## 2013-11-22 MED ORDER — CITALOPRAM HYDROBROMIDE 10 MG PO TABS: 10.0000 mg | ORAL_TABLET | Freq: Every day | ORAL | Status: DC

## 2013-11-22 MED ORDER — LEVOTHYROXINE SODIUM 200 MCG PO TABS: 200.0000 ug | ORAL_TABLET | Freq: Every day | ORAL | Status: DC

## 2013-11-22 MED ORDER — WARFARIN SODIUM 7.5 MG PO TABS: 7.5000 mg | ORAL_TABLET | Freq: Every day | ORAL | Status: DC

## 2013-11-22 MED ORDER — SPIRONOLACTONE 12.5 MG HALF TABLET: 12.5000 mg | ORAL_TABLET | Freq: Every day | ORAL | Status: DC

## 2013-11-22 MED ORDER — ASPIRIN 325 MG PO TABS: 325.0000 mg | ORAL_TABLET | Freq: Every day | ORAL | Status: DC

## 2013-11-22 MED ORDER — LISINOPRIL 10 MG PO TABS: 10.0000 mg | ORAL_TABLET | Freq: Every day | ORAL | Status: DC

## 2013-11-22 MED ORDER — FE FUMARATE-B12-VIT C-FA-IFC PO CAPS: 1.0000 | ORAL_CAPSULE | Freq: Every morning | ORAL | Status: DC

## 2013-11-22 MED ORDER — PANTOPRAZOLE SODIUM 40 MG PO TBEC: 40.0000 mg | DELAYED_RELEASE_TABLET | Freq: Every day | ORAL | Status: DC

## 2013-11-22 MED ORDER — TEMAZEPAM 15 MG PO CAPS: 15.0000 mg | ORAL_CAPSULE | Freq: Every day | ORAL | Status: DC

## 2013-11-22 MED ORDER — WARFARIN SODIUM 7.5 MG PO TABS
7.5000 mg | ORAL_TABLET | Freq: Every day | ORAL | Status: DC
  Filled 2013-11-22: qty 1

## 2013-11-22 NOTE — Progress Notes (Signed)
Social Work Patient ID: Johnny Oneill, male   DOB: 1941-07-18, 73 y.o.   MRN: [id number]  Johnny Pall, LCSW Social Worker Signed  Patient Care Conference Service date: 11/22/2013 7:18 AM  Inpatient RehabilitationTeam Conference and Plan of Care Update Date: 11/21/2013   Time: 2:15 PM     Patient Name: Johnny Oneill       Medical Record Number: [id number]   Date of Birth: 07-11-1941 Sex: Male         Room/Bed: 4W13C/4W13C-01 Payor Info: Payor: MEDICARE / Plan: MEDICARE PART A AND B / Product Type: *No Product type* /   Admitting Diagnosis: L VAD Deconditioned   Admit Date/Time:  11/17/2013  4:02 PM Admission Comments: No comment available   Primary Diagnosis:  <principal problem not specified> Principal Problem: <principal problem not specified>    Patient Active Problem List     Diagnosis  Date Noted   .  Physical deconditioning  11/17/2013   .  Hemorrhagic shock  11/06/2013   .  Cardiac arrest  11/06/2013   .  Hemothorax on right  11/06/2013   .  Acute respiratory failure  11/06/2013   .  Poor venous access  11/02/2013   .  Vomiting  11/01/2013   .  Weakness  11/01/2013   .  Acute delirium  09/25/2013   .  LVAD (left ventricular assist device) present  09/18/2013   .  Fever  09/17/2013   .  Palliative care encounter  09/15/2013   .  Weakness generalized  09/15/2013   .  Nonspecific (abnormal) findings on radiological and other examination of gastrointestinal tract  09/14/2013   .  Anemia, unspecified  09/14/2013   .  Acute on chronic renal failure  09/13/2013   .  Acute sinusitis  07/24/2013   .  Postablative hypothyroidism  03/17/2013   .  Acute on chronic systolic heart failure  0000000   .  PVC (premature ventricular contraction)  12/08/2012   .  NSVT (nonsustained ventricular tachycardia)  12/08/2012   .  Cardiogenic shock  12/07/2012   .  Acute blood loss anemia  12/01/2011   .  Essential hypertension, benign  11/12/2010   .  ISCHEMIC CARDIOMYOPATHY  11/12/2010    .  Chronic systolic heart failure  [id number]   .  Ischemic cardiomyopathy     .  Myocardial infarction of lateral wall     .  Hypercholesteremia     .  ICD (implantable cardiac defibrillator) in place     .  CAD (coronary artery disease)     .  Osteoarthritis       Expected Discharge Date: Expected Discharge Date: 11/22/13  Team Members Present: Physician leading conference: Dr. Alger Simons Social Worker Present: Johnny Pall, LCSW Nurse Present: Janyth Pupa, RN PT Present: Canary Brim, Rayburn Ma, PT OT Present: Chrys Racer, Starling Manns, OT;Frank [place], OT SLP Present: Weston Anna, SLP PPS Coordinator present : Ileana Ladd, PT        Current Status/Progress  Goal  Weekly Team Focus   Medical     improving tolerance of activity. s/p HTX s/p VATS. LVAD patient  improve stamina with basic activities/mobility  stabilize medically for dc   Bowel/Bladder     Continent of bowel and bladder. LBM 11/19/13  Pt to remain continent of bowel and bladder  Monitor q shift   Swallow/Nutrition/ Hydration            ADL's  Modified Independent  Modified Indepenent  endurance, dynamic standing balance, transfers   Mobility     supervision/mod I  mod I gait, supervision for car and stairs  family education, dynamic balance, HEP   Communication            Safety/Cognition/ Behavioral Observations           Pain     No c/o pain  <3  Monitor for nonverbald cues. Offer prn pain medication 1 hour prior to initial therapy session   Skin     Steristrips to R chest, cdi. Dressing to R flank area-cdi  No additional skin breakdown  Assess q shift for appropriate healing    Rehab Goals Patient on target to meet rehab goals: Yes *See Care Plan and progress notes for long and short-term goals.    Barriers to Discharge:  none ID'd      Possible Resolutions to Barriers:    n/a     Discharge Planning/Teaching Needs:    home with wife to provide 24/7  assistance/ supervision as needed      Team Discussion:    Reaching mod i goals and pt eager to d/c.  Wife with pt at all times.  Recommend follow up HHPT.  Anticipate d/c tomorrow if medically cleared.   Revisions to Treatment Plan:    None    Continued Need for Acute Rehabilitation Level of Care: The patient requires daily medical management by a physician with specialized training in physical medicine and rehabilitation for the following conditions: Daily direction of a multidisciplinary physical rehabilitation program to ensure safe treatment while eliciting the highest outcome that is of practical value to the patient.: Yes Daily medical management of patient stability for increased activity during participation in an intensive rehabilitation regime.: Yes Daily analysis of laboratory values and/or radiology reports with any subsequent need for medication adjustment of medical intervention for : Pulmonary problems;Post surgical problems  [name] 11/22/2013, 7:18 AM

## 2013-11-22 NOTE — Progress Notes (Signed)
Advanced Heart Failure Team Rounding Note     Subjective:    Johnny Oneill is a 73 year old patient with a history of CAD and HF s/p LVAD HMII 09/19/2013 recently admitted with SBO. Hospitalization complicated by R hemothorax, PEA arrest and VDRF. Transferred to CIR 3/20  Doing well. No complaints.   MAPs 94-110  LVAD INTERROGATION:  HeartMate II LVAD: Flow 4.8 liters/min, speed 9400, power 6.1 PI 5.4; 0 PI events   Physical Exam: GENERAL: Lying in bed. No distress HEENT: normal   NECK: Supple, JVP 7 prominent CV waves CARDIAC: LVAD hum present.  LUNGS: clear. Mildly decreased on R ABDOMEN: soft nontender,  Bowel sounds hypoactive LVAD exit site: well-healed and incorporated. Dressing dry and intact. No erythema or drainage. Stabilization device present and accurately applied. Driveline dressing is being changed daily per sterile technique.  EXTREMITIES: Warm and dry, no cyanosis, clubbing, rash or edema  NEUROLOGIC: awake non-focal.  Telemetry: SR   Labs: Basic Metabolic Panel:  Recent Labs Lab 11/16/13 0510 11/17/13 0550 11/20/13 0733 11/22/13 0603  NA 138 140 139 138  K 3.4* 3.8 3.1* 3.4*  CL 100 103 104 104  CO2 25 24 22 19   GLUCOSE 104* 88 87 83  BUN 18 17 13 10   CREATININE 0.88 0.99 0.97 0.95  CALCIUM 8.4 8.3* 8.3* 8.3*  MG 2.1  --   --   --   PHOS 3.6  --   --   --     Liver Function Tests:  Recent Labs Lab 11/16/13 0510 11/17/13 0550 11/20/13 0733  AST 24 24 20   ALT 32 31 23  ALKPHOS 151* 146* 122*  BILITOT 0.8 0.8 0.7  PROT 6.5 6.5 6.4  ALBUMIN 2.4* 2.5* 2.5*   No results found for this basename: LIPASE, AMYLASE,  in the last 168 hours No results found for this basename: AMMONIA,  in the last 168 hours  CBC:  Recent Labs Lab 11/18/13 0529 11/19/13 0550 11/20/13 0733 11/21/13 0603 11/22/13 0603  WBC 5.5 5.3 4.7 4.5 4.7  NEUTROABS  --   --  3.4  --   --   HGB 8.3* 8.5* 8.9* 8.9* 9.0*  HCT 26.0* 26.2* 28.0* 27.9* 28.5*  MCV 90.3 90.0  91.2 90.6 90.2  PLT 241 246 239 222 232    Cardiac Enzymes: No results found for this basename: CKTOTAL, CKMB, CKMBINDEX, TROPONINI,  in the last 168 hours  BNP: BNP (last 3 results)  Recent Labs  09/13/13 1349 09/25/13 0400 11/01/13 1033  PROBNP 3883.0* 7019.0* 877.6*    CBG:  Recent Labs Lab 11/16/13 1200 11/16/13 1555 11/16/13 2000 11/16/13 2357 11/17/13 0710  GLUCAP 100* 106* 109* 108* 88    Coagulation Studies:  Recent Labs  11/20/13 0733 11/21/13 0603 11/22/13 0603  LABPROT 19.2* 20.8* 21.7*  INR 1.67* 1.85* 1.96*    Other results:   Imaging: Dg Chest 2 View  11/21/2013   CLINICAL DATA:  Cough.  Ventricular assist device.  EXAM: CHEST  2 VIEW  COMPARISON:  DG CHEST 2 VIEW dated 11/16/2013; CT CHEST W/CM dated 11/10/2013  FINDINGS: Mediastinum and hilar structures are normal. For persistent density is noted in the periphery of the right upper to mid lung. This could represent fluid in the major fissure. This could represent atelectasis, infiltrate, or infarct. A chest CT can be performed for further evaluation. Small right pleural effusions again noted. Small left pleural effusion cannot be excluded. No focal new infiltrate noted. Prior median sternotomy. Left ventricular assist  device noted in stable position. AICD in stable position.  IMPRESSION: 1. Stable peripheral density in the right upper to mid lung. Differential diagnosis includes a fluid pseudotumor in the the right major fissure, atelectasis, infiltrate, or infarct. Chest CT can be obtained for further evaluation if clinically indicated. 2. Cardiovascular structures are stable. Cardiac assist device is noted in stable position as is the AICD.   Electronically Signed   By: Marcello Moores  Register   On: 11/21/2013 17:55     Assessment:   1. Hemorrhagic shock due to R hemothorax    --s/p R chest tube 3/8 2. 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 CAD 4. Hypothyroidism 5. SBO 6. Acute blood loss anemia 7.  PEA arrest 8. Shock liver 9. A/C renal failure, stage III -resolved 10. Ventilatory-dependent respiratory failure 11. Hypokalemia  Plan/Discussion:     Doing well. Continues to progress with activity - riding bike, etc.  Appetite good. Appreciate CIR's care  INR 1.96. Ok to d/c home today from our standpoint . Continue current meds except increase lisinopril to 20 daily. Will f/u in HF Clinic next week   Can use lasix as needed only. Continue spiro.  VAD interrogation looks good.    Kataryna Mcquilkin,MD 10:26 AM

## 2013-11-22 NOTE — Discharge Summary (Signed)
Discharge summary job [id number]

## 2013-11-22 NOTE — Progress Notes (Signed)
Social Work  Discharge Note  The overall goal for the admission was met for:   Discharge location: Yes - home with wife who can provide any needed assistance  Length of Stay: Yes - 5 days  Discharge activity level: Yes - modified independent overall  Home/community participation: Yes  Services provided included: MD, RD, PT, OT, RN, Pharmacy and [place]: Medicare and Private Insurance: [place]  Follow-up services arranged: Home Health: PT, RN via [place] and Patient/Family has no preference for HH/DME agencies  Comments (or additional information):  Patient/Family verbalized understanding of follow-up arrangements: Yes  Individual responsible for coordination of the follow-up plan: patient  Confirmed correct DME delivered: NA    Kapri Nero

## 2013-11-22 NOTE — Progress Notes (Signed)
[place] PHYSICAL MEDICINE & REHABILITATION     PROGRESS NOTE    Subjective/Complaints: Anxious to go today. A 12 point review of systems has been performed and if not noted above is otherwise negative.   Objective: Vital Signs: Blood pressure 115/78, pulse 85, temperature 98.3 F (36.8 C), temperature source Oral, resp. rate 17, height 5\' 7"  (1.702 m), weight 66 kg (145 lb 8.1 oz), SpO2 96.00%. Dg Chest 2 View  11/21/2013   CLINICAL DATA:  Cough.  Ventricular assist device.  EXAM: CHEST  2 VIEW  COMPARISON:  DG CHEST 2 VIEW dated 11/16/2013; CT CHEST W/CM dated 11/10/2013  FINDINGS: Mediastinum and hilar structures are normal. For persistent density is noted in the periphery of the right upper to mid lung. This could represent fluid in the major fissure. This could represent atelectasis, infiltrate, or infarct. A chest CT can be performed for further evaluation. Small right pleural effusions again noted. Small left pleural effusion cannot be excluded. No focal new infiltrate noted. Prior median sternotomy. Left ventricular assist device noted in stable position. AICD in stable position.  IMPRESSION: 1. Stable peripheral density in the right upper to mid lung. Differential diagnosis includes a fluid pseudotumor in the the right major fissure, atelectasis, infiltrate, or infarct. Chest CT can be obtained for further evaluation if clinically indicated. 2. Cardiovascular structures are stable. Cardiac assist device is noted in stable position as is the AICD.   Electronically Signed   By: Marcello Moores  Register   On: 11/21/2013 17:55    Recent Labs  11/21/13 0603 11/22/13 0603  WBC 4.5 4.7  HGB 8.9* 9.0*  HCT 27.9* 28.5*  PLT 222 232    Recent Labs  11/20/13 0733 11/22/13 0603  NA 139 138  K 3.1* 3.4*  CL 104 104  GLUCOSE 87 83  BUN 13 10  CREATININE 0.97 0.95  CALCIUM 8.3* 8.3*   CBG (last 3)  No results found for this basename: GLUCAP,  in the last 72 hours  Wt Readings from Last  3 Encounters:  11/22/13 66 kg (145 lb 8.1 oz)  11/16/13 72.2 kg (159 lb 2.8 oz)  11/16/13 72.2 kg (159 lb 2.8 oz)    Physical Exam:  Constitutional: He is oriented to person, place, and time. He appears well-developed.  No distress HENT:  Head: Normocephalic and atraumatic.  Eyes: EOM are normal.  Neck: Normal range of motion. Neck supple. No JVD present. No tracheal deviation present. No thyromegaly present.  Cardiovascular:  Hum of LVAD auscultated  Respiratory: Breath sounds normal. No respiratory distress. Slight decrease in sounds on the right.  GI: Soft. Bowel sounds are normal. He exhibits no distension.  Lymphadenopathy:  He has no cervical adenopathy.  Neurological: He is alert and oriented to person, place, and time.  Motor 5/5 UE prox to distal, LE 4- HF, 4 KE, ankles 4+. Fatigues quickly. No gross sensory findings. CN exam intact. Good insight and awareness. Normal memory. DTR's 1+  Skin:  chest tube sites clean and dry, dressed with minimal drainage. Psychiatric: He has a normal mood and affect. His behavior is normal. Judgment and thought content normal   Assessment/Plan: 1. Functional deficits secondary to deconditioning after hemothorax/VATS which require 3+ hours per day of interdisciplinary therapy in a comprehensive inpatient rehab setting. Physiatrist is providing close team supervision and 24 hour management of active medical problems listed below. Physiatrist and rehab team continue to assess barriers to discharge/monitor patient progress toward functional and medical goals.  FIM:  FIM - Bathing Bathing Steps Patient Completed: Chest;Right Arm;Left Arm;Abdomen;Front perineal area;Buttocks;Right upper leg;Left upper leg;Right lower leg (including foot);Left lower leg (including foot) Bathing: 6: More than reasonable amount of time  FIM - Upper Body Dressing/Undressing Upper body dressing/undressing steps patient completed: Thread/unthread right sleeve of front  closure shirt/dress;Thread/unthread left sleeve of front closure shirt/dress;Pull shirt around back of front closure shirt/dress;Button/unbutton shirt Upper body dressing/undressing: 6: More than reasonable amount of time FIM - Lower Body Dressing/Undressing Lower body dressing/undressing steps patient completed: Thread/unthread right underwear leg;Thread/unthread left underwear leg;Pull underwear up/down;Thread/unthread right pants leg;Thread/unthread left pants leg;Pull pants up/down;Fasten/unfasten pants;Don/Doff right sock;Don/Doff left sock;Don/Doff right shoe;Don/Doff left shoe;Fasten/unfasten right shoe;Fasten/unfasten left shoe Lower body dressing/undressing: 7: Complete Independence: No helper  FIM - Toileting Toileting steps completed by patient: Adjust clothing prior to toileting;Performs perineal hygiene;Adjust clothing after toileting Toileting Assistive Devices: Grab bar or rail for support Toileting: 5: Supervision: Safety issues/verbal cues  FIM - Radio producer Devices: Insurance account manager Transfers: 6-To toilet/ BSC;6-From toilet/BSC  FIM - Control and instrumentation engineer Devices: Copy: 6: Chair or W/C > Bed: No assist  FIM - Locomotion: Wheelchair Distance: 5 feet, utilizing B LEs  Locomotion: Wheelchair: 1: Travels less than 50 ft with supervision, cueing or coaxing FIM - Locomotion: Ambulation Locomotion: Ambulation Assistive Devices: Administrator Ambulation/Gait Assistance: 4: Min assist Locomotion: Ambulation: 5: Travels 150 ft or more with supervision/safety issues  Comprehension Comprehension Mode: Auditory Comprehension: 7-Follows complex conversation/direction: With no assist  Expression Expression Mode: Verbal Expression: 7-Expresses complex ideas: With no assist  Social Interaction Social Interaction: 7-Interacts appropriately with others - No medications needed.  Problem  Solving Problem Solving: 6-Solves complex problems: With extra time  Memory Memory: 6-More than reasonable amt of time  Medical Problem List and Plan:  1. deconditioning related to multi-medical, history of right status post VATS as well as LVAD January 2015  2. DVT Prophylaxis/Anticoagulation: Coumadin per pharmacy. Monitor for any bleeding episodes  3. Pain Management: Tylenol as needed  3. Mood/anxiety. Low-dose Xanax as needed.   5. Neuropsych: This patient is capable of making decisions on his own behalf.  6. History of LVAD January 2015. Followup per cardiology services. Pt is competent to help direct mgt of the device as well.  7. Chronic anemia. Latest hemoglobin 8.5. Continue iron supplement  8. Hypertension. lisinopril 2.5 mg daily, Aldactone 12.5 mg daily. MAP remains 90-100 generally (some increase last night) 9. Diastolic congestive heart failure. Continue Lasix 40 mg daily.    -check daily weights, follow I's and O's ---67kg today, neg so far 10.[name] Hypothyroidism. Synthroid  11. Wound care: staples out of chest, sutures out   LOS (Days) 5 A FACE TO FACE EVALUATION WAS PERFORMED  Everlene Cunning T 11/22/2013 8:30 AM

## 2013-11-22 NOTE — Progress Notes (Signed)
Physical Therapy Note  Patient Details  Name: Johnny Oneill MRN: [id number] Date of Birth: [birth year] Today's Date: 11/22/2013  Time: 40 Pt refused PT session 45 minutes due to c/o fatigue, afraid of being light headed and passing out if he attempts PT.  Pt aware of d/c home today, says he wants to rest before going home.   DONAWERTH,KAREN 11/22/2013, 8:23 AM

## 2013-11-22 NOTE — Discharge Summary (Signed)
NAME:  Johnny Oneill, Johnny Oneill NO.:  [id number]  MEDICAL RECORD NO.:  [id number]  LOCATION:  4W13C                        FACILITY:  [facility]  PHYSICIAN:  Meredith Staggers, M.D.DATE OF BIRTH:  05/20/1941  DATE OF ADMISSION:  11/16/2013 DATE OF DISCHARGE:  11/22/2013                              DISCHARGE SUMMARY   DISCHARGE DIAGNOSES: 1. Deconditioning related to multi medical with history of video-     assisted thoracoscopic surgery as well as left ventricular assist     device in January 2015. 2. Coumadin therapy. 3. History of left ventricular assist device in January 2015. 4. Anxiety. 5. Chronic anemia. 6. Hypertension. 7. Diastolic congestive heart failure. 8. Hypothyroidism.  HISTORY OF PRESENT ILLNESS:  This is a 73 year old right-handed male, complicated medical history of CAD, status post stenting; chronic Coumadin, as well as systolic heart failure with LVAD in September 19, 2013; currently, [facility] transplant list,  presented on November 01, 2013, after followup in the [facility] with syncopal episode as well as vomiting and lethargy.  CT of the abdomen consistent with small-bowel obstruction.  A nasogastric tube was placed, followup per General Surgery.  TPN was initiated for nutritional support.  Hospital course was complicated by cardiac arrest on November 05, 2013, with PEA in the setting of right hemothorax with chest tube placed on November 06, 2013, as well as VATS procedure and subsequent Interventional Radiology Pigtail drainage of loculated right apical hematoma and chest tube ultimately removed on November 15, 2013.  The patient's small bowel obstruction, ileus, continued to improve.  Diet slowly advanced.  He did require intubation until November 12, 2013.  Physical and occupational therapy ongoing.  The patient was admitted for comprehensive rehab program.  PAST MEDICAL HISTORY:  See discharge diagnoses.  SOCIAL HISTORY:  Lives with spouse.  Functional  history prior to admission independent and driving. Functional status upon admission to rehab services was minimal assist for mobility and transfers, ambulating 120 feet.  PHYSICAL EXAMINATION:  VITAL SIGNS:  Blood pressure 94/40, pulse 86, temperature 97, respirations 18. GENERAL:  This was an alert male, no acute distress, oriented x3. Hum of LVAD auscultated. Pupils round and reactive to light. LUNGS:  Clear to auscultation. ABDOMEN:  Soft and nontender.  Good bowel sounds.  No distention. Chest tube site clean and dry.  [place] COURSE:  The patient was admitted to inpatient rehab services with therapies initiated on a 3-hour daily basis consisting of physical therapy, occupational therapy, and rehabilitation nursing.  The following issues were addressed during the patient's rehabilitation stay.  Pertaining to [name] deconditioning related to multi medical with VATS as well as LVAD in January 2015, continued to progress nicely in all areas of mobility with followup per Cardiothoracic Surgery as well as Cardiology Services.  He remained on Coumadin therapy.  Latest INR of 1.85 monitored closely, no bleeding episodes.  He would follow up with [facility].  His blood pressures remained well controlled.  He exhibited no signs of fluid overload.  Again, monitored closely by Cardiology Services.  He did have a history of chronic anemia, maintained on iron supplement.  Latest hemoglobin of 8.9.  He continued on hormone supplement for hypothyroidism.  The patient received weekly collaborative interdisciplinary team conferences to discuss estimated length of stay, family teaching, and any barriers to discharge.  He was ambulating 200 feet, close supervision.  Fatigue and endurance continued to greatly improve, working with lower extremity strengthening with activities of daily living.  Worked on alternating tapping of each foot.  Oxygen saturations  remained good at 93% to 95% on room air.  Full family teaching was completed.  Plan was to be discharged to home.  DISCHARGE MEDICATIONS: 1. Aspirin 325 mg p.o. daily. 2. Celexa 10 mg p.o. daily. 3. Ferrous sulfate 325 mg p.o. t.i.d. 4. Synthroid 200 mcg p.o. daily. 5. Lisinopril 10 mg p.o. daily. 6. Protonix 40 mg p.o. daily. 7. Aldactone 12.5 mg p.o. daily. 8. Restoril 50 mg p.o. at bedtime. 9. Coumadin 7.5 mg daily, adjusted accordingly for an INR of 2.0 to     3.0.  DIET:  Mechanical soft.  SPECIAL INSTRUCTIONS:  The patient would follow up with Dr. Alger Simons at the outpatient rehab service office as needed; Dr. Glori Bickers, Cardiology Service, 2 weeks call for appointment; Dr. Tharon Aquas Trigt call for appointment in 1 week; Dr. Lavone Orn, medical management.  Special Instructions:  The patient to follow up with [facility] for ongoing management of Coumadin therapy.     Lauraine Rinne, P.A.   ______________________________ Meredith Staggers, M.D.    DA/MEDQ  D:  11/22/2013  T:  11/22/2013  Job:  [id number]  cc:   Meredith Staggers, M.D. Shaune Pascal. Bensimhon, MD Ivin Poot, M.D. Delanna Ahmadi, M.D.

## 2013-11-22 NOTE — Progress Notes (Signed)
Patient and wife given discharge instructions from [place], [place]; all questions answered.  Molly, LVAD RN, assisted patient with transitioning to his own set of batteries for the ride home.  Patient escorted via wheelchair by Regino Bellow, NT to wife's vehicle.

## 2013-11-22 NOTE — Patient Care Conference (Signed)
Inpatient RehabilitationTeam Conference and Plan of Care Update Date: 11/21/2013   Time: 2:15 PM    Patient Name: Johnny Oneill      Medical Record Number: [id number]  Date of Birth: 04/04/1941 Sex: Male         Room/Bed: 4W13C/4W13C-01 Payor Info: Payor: MEDICARE / Plan: MEDICARE PART A AND B / Product Type: *No Product type* /    Admitting Diagnosis: L VAD Deconditioned  Admit Date/Time:  11/17/2013  4:02 PM Admission Comments: No comment available   Primary Diagnosis:  <principal problem not specified> Principal Problem: <principal problem not specified>  Patient Active Problem List   Diagnosis Date Noted  . Physical deconditioning 11/17/2013  . Hemorrhagic shock 11/06/2013  . Cardiac arrest 11/06/2013  . Hemothorax on right 11/06/2013  . Acute respiratory failure 11/06/2013  . Poor venous access 11/02/2013  . Vomiting 11/01/2013  . Weakness 11/01/2013  . Acute delirium 09/25/2013  . LVAD (left ventricular assist device) present 09/18/2013  . Fever 09/17/2013  . Palliative care encounter 09/15/2013  . Weakness generalized 09/15/2013  . Nonspecific (abnormal) findings on radiological and other examination of gastrointestinal tract 09/14/2013  . Anemia, unspecified 09/14/2013  . Acute on chronic renal failure 09/13/2013  . Acute sinusitis 07/24/2013  . 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 Acute on chronic systolic heart failure 0000000  . PVC (premature ventricular contraction) 12/08/2012  . NSVT (nonsustained ventricular tachycardia) 12/08/2012  . Cardiogenic shock 12/07/2012  . Acute blood loss anemia 12/01/2011  . Essential hypertension, benign 11/12/2010  . ISCHEMIC CARDIOMYOPATHY 11/12/2010  . Chronic systolic heart failure [id number]  . Ischemic cardiomyopathy   . Myocardial infarction of lateral wall   . Hypercholesteremia   . ICD (implantable cardiac defibrillator) in place   . CAD (coronary artery disease)   . Osteoarthritis     Expected Discharge  Date: Expected Discharge Date: 11/22/13  Team Members Present: Physician leading conference: Dr. Alger Simons Social Worker Present: Lennart Pall, LCSW Nurse Present: Janyth Pupa, RN PT Present: Canary Brim, Rayburn Ma, PT OT Present: Chrys Racer, Starling Manns, OT;Frank [place], OT SLP Present: Weston Anna, SLP PPS Coordinator present : Ileana Ladd, PT     Current Status/Progress Goal Weekly Team Focus  Medical   improving tolerance of activity. s/p HTX s/p VATS. LVAD patient  improve stamina with basic activities/mobility  stabilize medically for dc   Bowel/Bladder   Continent of bowel and bladder. LBM 11/19/13  Pt to remain continent of bowel and bladder  Monitor q shift   Swallow/Nutrition/ Hydration             ADL's   Modified Independent  Modified Indepenent  endurance, dynamic standing balance, transfers   Mobility   supervision/mod I  mod I gait, supervision for car and stairs  family education, dynamic balance, HEP   Communication             Safety/Cognition/ Behavioral Observations            Pain   No c/o pain  <3  Monitor for nonverbald cues. Offer prn pain medication 1 hour prior to initial therapy session   Skin   Steristrips to R chest, cdi. Dressing to R flank area-cdi  No additional skin breakdown  Assess q shift for appropriate healing    Rehab Goals Patient on target to meet rehab goals: Yes *See Care Plan and progress notes for long and short-term goals.  Barriers to Discharge: none ID'd    Possible Resolutions to Barriers:  n/a    Discharge Planning/Teaching Needs:  home with wife to provide 24/7 assistance/ supervision as needed      Team Discussion:  Reaching mod i goals and pt eager to d/c.  Wife with pt at all times.  Recommend follow up HHPT.  Anticipate d/c tomorrow if medically cleared.  Revisions to Treatment Plan:  None   Continued Need for Acute Rehabilitation Level of Care: The patient requires daily  medical management by a physician with specialized training in physical medicine and rehabilitation for the following conditions: Daily direction of a multidisciplinary physical rehabilitation program to ensure safe treatment while eliciting the highest outcome that is of practical value to the patient.: Yes Daily medical management of patient stability for increased activity during participation in an intensive rehabilitation regime.: Yes Daily analysis of laboratory values and/or radiology reports with any subsequent need for medication adjustment of medical intervention for : Pulmonary problems;Post surgical problems  Shakeria Robinette 11/22/2013, 7:18 AM

## 2013-11-22 NOTE — Discharge Instructions (Signed)
Inpatient Rehab Discharge Instructions  BRANDEN FILLINGER Discharge date and time: No discharge date for patient encounter.   Activities/Precautions/ Functional Status: Activity: activity as tolerated Diet: Soft Wound Care: keep wound clean and dry Functional status:  ___ No restrictions     ___ Walk up steps independently ___ 24/7 supervision/assistance   ___ Walk up steps with assistance ___ Intermittent supervision/assistance  ___ Bathe/dress independently ___ Walk with walker     ___ Bathe/dress with assistance ___ Walk Independently    ___ Shower independently _x__ Walk with assistance    ___ Shower with assistance ___ No alcohol     ___ Return to work/school ________   COMMUNITY REFERRALS UPON DISCHARGE:    Home Health:   PT      RN                      Agency: [facility] Phone: [phone]       Special Instructions: LVAD dressing as directed   My questions have been answered and I understand these instructions. I will adhere to these goals and the provided educational materials after my discharge from the hospital.  Patient/Caregiver Signature _______________________________ Date __________  Clinician Signature _______________________________________ Date __________  Please bring this form and your medication list with you to all your follow-up doctor's appointments.

## 2013-11-23 ENCOUNTER — Telehealth (HOSPITAL_COMMUNITY): Payer: Self-pay | Admitting: *Deleted

## 2013-11-23 MED ORDER — LISINOPRIL 10 MG PO TABS: 10.0000 mg | ORAL_TABLET | Freq: Every day | ORAL | Status: DC

## 2013-11-23 NOTE — Telephone Encounter (Signed)
Wife called to report patient did not get Rx for home lisinopril. Verified with Bonnita Nasuti, PharmD pt is to take lisinopril 10 mg daily; Rx sent. Also verified new Synthroid dose of 200 mcg daily is correct; will add TSH, T4, T3 to next clinic visit labs. Wife updated on above.

## 2013-11-24 ENCOUNTER — Ambulatory Visit (HOSPITAL_COMMUNITY): Payer: Self-pay | Admitting: *Deleted

## 2013-11-24 LAB — PROTIME-INR
INR: 2.8 — AB (ref <=1.1)
INR: 2.8 — AB (ref <=1.1)

## 2013-11-24 NOTE — Progress Notes (Signed)
Physical Therapy Discharge Summary  Patient Details  Name: Johnny Oneill MRN: [id number] Date of Birth: 11-05-1940  Today's Date: 11/24/2013    Patient has met 7 of 8 long term goals due to improved activity tolerance, improved balance, increased strength and ability to compensate for deficits.  Patient to discharge at an ambulatory level Modified Independent.   Patient's care partner is independent to provide the necessary physical assistance at discharge.  Reasons goals not met: Pt did not meet stair goal of supervision, due to not having rails at home he will need min HHA. Wife has practiced and is prepared to assist pt into/out of house  Recommendation:  Patient will benefit from ongoing skilled PT services in home health setting to continue to advance safe functional mobility, address ongoing impairments in endurance, establishing progressing exercise program, and minimize fall risk.  Equipment: No equipment provided  Reasons for discharge: treatment goals met and discharge from hospital  Patient/family agrees with progress made and goals achieved: Yes  PT Discharge Precautions/Restrictions Precautions Precautions: Fall;None Precaution Comments: lightheadedness with position changes Restrictions Weight Bearing Restrictions: No Other Position/Activity Restrictions: sternal precautions  Cognition Overall Cognitive Status: Within Functional Limits for tasks assessed Arousal/Alertness: Awake/alert Orientation Level: Oriented X4 Attention: Alternating Alternating Attention: Appears intact Memory: Appears intact Awareness: Appears intact Problem Solving: Appears intact Safety/Judgment: Appears intact Sensation Sensation Light Touch: Appears Intact Stereognosis: Appears Intact Hot/Cold: Appears Intact Proprioception: Appears Intact Coordination Gross Motor Movements are Fluid and Coordinated: Yes Fine Motor Movements are Fluid and Coordinated: Yes Motor  Motor Motor:  Within Functional Limits  Mobility Bed Mobility Bed Mobility: Supine to Sit;Sit to Supine Supine to Sit: 6: Modified independent (Device/Increase time) Sit to Supine: 6: Modified independent (Device/Increase time) Transfers Sit to Stand: 6: Modified independent (Device/Increase time) Stand to Sit: 6: Modified independent (Device/Increase time) Stand Pivot Transfers: 6: Modified independent (Device/Increase time) Locomotion  Ambulation Ambulation: Yes Ambulation/Gait Assistance: 6: Modified independent (Device/Increase time) Ambulation Distance (Feet): 150 Feet Assistive device: Rolling walker Stairs / Additional Locomotion Stairs: Yes Stairs Assistance: 4: Min assist Stair Management Technique: No rails Number of Stairs: 6   Balance Balance Balance Assessed: No Static Sitting Balance Static Sitting - Balance Support: Feet supported Static Sitting - Level of Assistance: 6: Modified independent (Device/Increase time) Static Standing Balance Static Standing - Balance Support: During functional activity Static Standing - Level of Assistance: 6: Modified independent (Device/Increase time) Dynamic Standing Balance Dynamic Standing - Balance Support: During functional activity Dynamic Standing - Level of Assistance: 6: Modified independent (Device/Increase time) Dynamic Standing - Balance Activities: Reaching for objects;Reaching across midline;Reaching for weighted objects Extremity Assessment      RLE Assessment RLE Assessment: Exceptions to [facility] RLE AROM (degrees) Overall AROM Right Lower Extremity: Within functional limits for tasks assessed RLE Strength RLE Overall Strength: Deficits RLE Overall Strength Comments: R LE grossly 4/5 throuhgout LLE Assessment LLE Assessment: Exceptions to WFL LLE AROM (degrees) Overall AROM Left Lower Extremity: Within functional limits for tasks assessed LLE Strength LLE Overall Strength: Deficits LLE Overall Strength Comments: grossly  4/5 throughout  See FIM for current functional status  Lahoma Rocker 11/24/2013, 4:21 PM

## 2013-11-24 NOTE — Progress Notes (Signed)
Occupational Therapy Discharge Summary  Patient Details  Name: CAVEN PERINE MRN: [id number] Date of Birth: [birth year]  Today's Date: 11/24/2013  Patient has met 7 of 7 long term goals due to improved activity tolerance, improved balance, ability to compensate for deficits, improved attention and improved awareness.  Patient to discharge at overall Modified Independent level.  Patient's care partner is independent to provide any necessary assistance at discharge.    Reasons goals not met: n/a  Recommendation:  Patient will not require ongoing skilled OT services post discharge.  Equipment: No equipment provided  Reasons for discharge: treatment goals met  Patient/family agrees with progress made and goals achieved: Yes  OT Discharge Precautions/Restrictions  Precautions Precautions: Fall Precaution Comments: lightheadedness with position changes Restrictions Weight Bearing Restrictions: No Other Position/Activity Restrictions: sternal precautions Pain Pain Assessment Pain Assessment: No/denies pain ADL ADL ADL Comments: see FIM Vision/Perception  Vision - Assessment Eye Alignment: Within Functional Limits Vision Assessment: Vision not tested Perception Perception: Within Functional Limits Praxis Praxis: Intact  Cognition Overall Cognitive Status: Within Functional Limits for tasks assessed Arousal/Alertness: Awake/alert Orientation Level: Oriented X4 Attention: Alternating Alternating Attention: Appears intact Memory: Appears intact Awareness: Appears intact Problem Solving: Appears intact Safety/Judgment: Appears intact Sensation Sensation Light Touch: Appears Intact Stereognosis: Appears Intact Hot/Cold: Appears Intact Proprioception: Appears Intact Coordination Gross Motor Movements are Fluid and Coordinated: Yes Fine Motor Movements are Fluid and Coordinated: Yes Motor  Motor Motor: Within Functional Limits Mobility  Bed Mobility Bed Mobility:  Supine to Sit;Sit to Supine Supine to Sit: 6: Modified independent (Device/Increase time) Sit to Supine: 6: Modified independent (Device/Increase time) Transfers Transfers: Sit to Stand;Stand to Sit Sit to Stand: 6: Modified independent (Device/Increase time) Stand to Sit: 6: Modified independent (Device/Increase time)  Trunk/Postural Assessment  Cervical Assessment Cervical Assessment: Within Functional Limits Thoracic Assessment Thoracic Assessment: Within Functional Limits Lumbar Assessment Lumbar Assessment: Within Functional Limits Postural Control Postural Control: Within Functional Limits  Balance Balance Balance Assessed: No Static Sitting Balance Static Sitting - Balance Support: Feet supported Static Sitting - Level of Assistance: 6: Modified independent (Device/Increase time) Static Standing Balance Static Standing - Balance Support: During functional activity Static Standing - Level of Assistance: 6: Modified independent (Device/Increase time) Dynamic Standing Balance Dynamic Standing - Balance Support: During functional activity Dynamic Standing - Level of Assistance: 6: Modified independent (Device/Increase time) Dynamic Standing - Balance Activities: Reaching for objects;Reaching across midline;Reaching for weighted objects Extremity/Trunk Assessment RUE Assessment RUE Assessment: Within Functional Limits LUE Assessment LUE Assessment: Within Functional Limits  See FIM for current functional status  Sabastion Hrdlicka 11/24/2013, 8:00 AM

## 2013-11-27 ENCOUNTER — Ambulatory Visit (HOSPITAL_COMMUNITY): Payer: Self-pay | Admitting: *Deleted

## 2013-11-28 ENCOUNTER — Other Ambulatory Visit (HOSPITAL_COMMUNITY): Payer: Self-pay | Admitting: *Deleted

## 2013-11-28 DIAGNOSIS — E079 Disorder of thyroid, unspecified: Secondary | ICD-10-CM

## 2013-11-28 DIAGNOSIS — R55 Syncope and collapse: Secondary | ICD-10-CM | POA: Insufficient documentation

## 2013-11-28 DIAGNOSIS — Z9229 Personal history of other drug therapy: Secondary | ICD-10-CM

## 2013-11-28 DIAGNOSIS — Z95811 Presence of heart assist device: Secondary | ICD-10-CM

## 2013-11-28 DIAGNOSIS — Z7901 Long term (current) use of anticoagulants: Secondary | ICD-10-CM

## 2013-11-28 NOTE — Addendum Note (Signed)
Encounter addended by: Jolaine Artist, MD on: 11/28/2013 10:09 PM<BR>     Documentation filed: Charges VN, Visit Diagnoses, Problem List, Follow-up Section, LOS Section, Notes Section

## 2013-11-29 ENCOUNTER — Ambulatory Visit (HOSPITAL_BASED_OUTPATIENT_CLINIC_OR_DEPARTMENT_OTHER)
Admission: RE | Admit: 2013-11-29 | Discharge: 2013-11-29 | Disposition: A | Discharge status: Home / Self Care | Payer: Medicare Other | Source: Ambulatory Visit | Admitting: Cardiothoracic Surgery

## 2013-11-29 ENCOUNTER — Encounter: Payer: Self-pay | Admitting: Licensed Clinical Social Worker

## 2013-11-29 ENCOUNTER — Ambulatory Visit (HOSPITAL_COMMUNITY)
Admission: RE | Admit: 2013-11-29 | Discharge: 2013-11-29 | Disposition: A | Discharge status: Home / Self Care | Payer: Medicare Other | Source: Ambulatory Visit | Attending: Internal Medicine | Admitting: Internal Medicine

## 2013-11-29 ENCOUNTER — Ambulatory Visit (HOSPITAL_COMMUNITY): Payer: Medicare Other | Admitting: *Deleted

## 2013-11-29 VITALS — BP 110/0 | HR 80 | Ht 69.0 in | Wt 146.4 lb

## 2013-11-29 DIAGNOSIS — E876 Hypokalemia: Secondary | ICD-10-CM

## 2013-11-29 DIAGNOSIS — Z95811 Presence of heart assist device: Secondary | ICD-10-CM

## 2013-11-29 DIAGNOSIS — I10 Essential (primary) hypertension: Secondary | ICD-10-CM

## 2013-11-29 DIAGNOSIS — I5022 Chronic systolic (congestive) heart failure: Secondary | ICD-10-CM

## 2013-11-29 DIAGNOSIS — Z7901 Long term (current) use of anticoagulants: Secondary | ICD-10-CM

## 2013-11-29 DIAGNOSIS — E079 Disorder of thyroid, unspecified: Secondary | ICD-10-CM

## 2013-11-29 DIAGNOSIS — I498 Other specified cardiac arrhythmias: Secondary | ICD-10-CM | POA: Insufficient documentation

## 2013-11-29 DIAGNOSIS — I472 Ventricular tachycardia, unspecified: Secondary | ICD-10-CM

## 2013-11-29 DIAGNOSIS — I4729 Other ventricular tachycardia: Secondary | ICD-10-CM

## 2013-11-29 DIAGNOSIS — Z9229 Personal history of other drug therapy: Secondary | ICD-10-CM

## 2013-11-29 DIAGNOSIS — R630 Anorexia: Secondary | ICD-10-CM

## 2013-11-29 LAB — PROTIME-INR
INR: 2.61 — ABNORMAL HIGH (ref 0.00–1.49)
Prothrombin Time: 27 seconds — ABNORMAL HIGH (ref 11.6–15.2)

## 2013-11-29 LAB — CBC
HCT: 36.9 % — ABNORMAL LOW (ref 39.0–52.0)
Hemoglobin: 11.8 g/dL — ABNORMAL LOW (ref 13.0–17.0)
MCH: 28 pg (ref 26.0–34.0)
MCHC: 32 g/dL (ref 30.0–36.0)
MCV: 87.6 fL (ref 78.0–100.0)
Platelets: 216 10*3/uL (ref 150–400)
RBC: 4.21 MIL/uL — ABNORMAL LOW (ref 4.22–5.81)
RDW: 17.4 % — ABNORMAL HIGH (ref 11.5–15.5)
WBC: 4.7 10*3/uL (ref 4.0–10.5)

## 2013-11-29 LAB — COMPREHENSIVE METABOLIC PANEL
ALT: 29 U/L (ref 0–53)
AST: 35 U/L (ref 0–37)
Albumin: 3.1 g/dL — ABNORMAL LOW (ref 3.5–5.2)
Alkaline Phosphatase: 132 U/L — ABNORMAL HIGH (ref 39–117)
BUN: 13 mg/dL (ref 6–23)
CO2: 27 mEq/L (ref 19–32)
Calcium: 8.9 mg/dL (ref 8.4–10.5)
Chloride: 98 mEq/L (ref 96–112)
Creatinine, Ser: 0.98 mg/dL (ref 0.50–1.35)
GFR calc Af Amer: >90 mL/min (ref >90)
GFR calc non Af Amer: 81 mL/min — ABNORMAL LOW (ref >90)
Glucose, Bld: 114 mg/dL — ABNORMAL HIGH (ref 70–99)
Potassium: 3.1 mEq/L — ABNORMAL LOW (ref 3.7–5.3)
Sodium: 138 mEq/L (ref 137–147)
Total Bilirubin: 0.8 mg/dL (ref 0.3–1.2)
Total Protein: 7.8 g/dL (ref 6.0–8.3)

## 2013-11-29 LAB — T4, FREE: Free T4: 1.74 ng/dL (ref 0.80–1.80)

## 2013-11-29 LAB — TSH: TSH: 7.76 u[IU]/mL — ABNORMAL HIGH (ref 0.350–4.500)

## 2013-11-29 LAB — PRO B NATRIURETIC PEPTIDE: Pro B Natriuretic peptide (BNP): 2508 pg/mL — ABNORMAL HIGH (ref 0–125)

## 2013-11-29 LAB — LACTATE DEHYDROGENASE: LDH: 416 U/L — ABNORMAL HIGH (ref 94–250)

## 2013-11-29 MED ORDER — ELDERTONIC PO ELIX: ORAL_SOLUTION | ORAL | Status: DC

## 2013-11-29 MED ORDER — POTASSIUM CHLORIDE CRYS ER 20 MEQ PO TBCR: 20.0000 meq | EXTENDED_RELEASE_TABLET | Freq: Every day | ORAL | Status: DC

## 2013-11-29 MED ORDER — SPIRONOLACTONE 25 MG PO TABS: 25.0000 mg | ORAL_TABLET | Freq: Every day | ORAL | Status: DC

## 2013-11-29 NOTE — Progress Notes (Signed)
Symptom  Yes  No  Details   Angina        x  Ativity:   Claudication        x How far:   Syncope              x When: lightheaded one time with standing; weak  Stroke        x   Orthopnea        x How many pillows:   one  PND        x How often:  CPAP    N/A  How many hrs:   Pedal edema        x        right ankle; clears overnight  Abd fullness             x   N&V        x  poor appetite  Diaphoresis        x When:  Bleeding           x     Urine color        light yellow  SOB            x Activity:   With PT - squats  Palpitations        x When:   ICD shock        x   Hospitlizaitons        x       When/where/why:  3/4 - 11/22/13 [facility], PEA arrest  ED visit        x When/where/why:  Other MD        x When/who/why:  Activity     Home PT twice weekly  Fluid     No limitations < 1000  Diet     Low salt   Vital signs: HR: 80 MAP BP:  110 O2 Sat:  99 Wt:    146.4 lbs Hospital discharge wt: 145.8 lbs Ht: 5'9"  LVAD interrogation reveals:  Speed:  9400 Flow:  4.6 Power:  5.5 PI: 7.5 Alarms:  none Events:  none Fixed speed:  9400 Low speed limit:  8800  LVAD exit site:   Partially healed and incorporated. The velour is fully implanted at exit site. Gauze dressing with aquacel strip dry and intact. Dressing change using sterile technique, remains erythematous under aquacel strip, no drainage or foul odor noted; pt denies tenderness.  Stabilization device present and accurately applied. Driveline dressing is being changed MWF per sterile technique using guaze dressing with aquacel strip on exit site. Pt denies fever or chills. Pt/caregiver state they have adequate dressing supplies at home.     I reviewed the LVAD parameters from today, and compared the results to the patient's prior recorded data. No programming changes were made. The LVAD is functioning within specified parameters.  LVAD interrogation was negative for any significant power changes or alarms; a few PI events/speed  drops present which is consistent with the patient's history.    Pt/caregiver deny any alarms or VAD equipment issues. VAD coordinator reviewed daily log from home for daily temperature, weight, and VAD parameters. Pt is performing daily controller and system monitor self tests along with completing weekly and monthly maintenance for LVAD equipment.   LVAD equipment check completed and is in good working order. Back-up equipment present. LVAD education done on emergency procedures and precautions and reviewed exit site care.

## 2013-11-29 NOTE — Progress Notes (Signed)
CSW met with patient in the clinic for follow up VAD visit. Patient and wife in good spirits and report all is going well at home. Patient states he is feeling a little stronger and hoping to increase his weight. Patient continues to be motivated to get stronger with hopes of getting back to his work at [employer]. Wife very supportive of patient although hopeful that patient will consider a new option as she feels the work at [employer] may be to strenuous for patient. CSW continues to follow for support as needed. Raquel Sarna, [place]

## 2013-11-29 NOTE — Progress Notes (Signed)
Patient ID: Johnny Oneill, male   DOB: [birth year], 73 y.o.   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 The patient was re hospitalized for partial small bowel obstruction and then required right VATS and evacuation of hemothorax. He is currently receiving home physical therapy and is able to walk 200 feet and increasing. He has had no evidence of bleeding and his last INR was therapeutic. His appetite has been poor but his weight is stable. He is having regular bowel movements and denies symptoms of partial small bowel obstruction. The driveline dressing is on a Monday Wednesday Friday schedule and is healing and incorporating well. All surgical incisions are dry and intact.   today his blood pressure is elevated and medications will be adjusted.   Follow up: Interrogation of LVAD demonstrates no PI events, no arms with normal function.   Denies LVAD alarms.  Denies driveline trauma, erythema or drainage.  Denies ICD shocks.   Reports taking Coumadin as prescribed and adherence to anticoagulation based dietary restrictions.  Denies bright red blood per rectum or melena, no dark urine or hematuria.     Past Medical History  Diagnosis Date  . Ischemic cardiomyopathy     a. 10/09 Echo: Sev LV Dysfxn, inf/lat AK, Mod MR.  [name];  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 Hypertension   . PVC (premature ventricular contraction)   . Pacemaker   . CHF (congestive heart failure)   . Shortness of breath   . Automatic  implantable cardioverter-defibrillator in situ     Current Outpatient Prescriptions  Medication Sig Dispense Refill  . aspirin 325 MG tablet Take 1 tablet (325 mg total) by mouth daily.      . citalopram (CELEXA) 10 MG tablet Take 1 tablet (10 mg total) by mouth daily.  30 tablet  1  . Coenzyme Q10 (COQ10) 200 MG CAPS Take 200 mg by mouth daily.      [name] ezetimibe (ZETIA) 10 MG tablet Take 10 mg by mouth daily.      . ferrous Q000111Q C-folic acid (TRINSICON / FOLTRIN) capsule Take 1 capsule by mouth every other day.      . levothyroxine (SYNTHROID, LEVOTHROID) 200 MCG tablet Take 1 tablet (200 mcg total) by mouth daily before breakfast.  30 tablet  1  . lisinopril (PRINIVIL,ZESTRIL) 10 MG tablet Take 1 tablet (10 mg total) by mouth daily.  30 tablet  6  . Omega-3 300 MG CAPS Take 300 mg by mouth at bedtime.       . pantoprazole (PROTONIX) 40 MG tablet Take 1 tablet (40 mg total) by mouth daily.  30 tablet  1  . rosuvastatin (CRESTOR) 20 MG tablet Take 20 mg by mouth daily.      [name] spironolactone (ALDACTONE) 25 MG tablet Take 1 tablet (25 mg total) by mouth daily.  30 tablet  1  . temazepam (RESTORIL)  15 MG capsule Take 1 capsule (15 mg total) by mouth at bedtime.  30 capsule  0  . warfarin (COUMADIN) 7.5 MG tablet Take 1 tablet (7.5 mg total) by mouth daily at 6 PM.  30 tablet  1  . geriatric multivitamins-minerals (ELDERTONIC/GEVRABON) ELIX Take one spoonful before each meal  240 mL  2  . nitroGLYCERIN (NITROSTAT) 0.4 MG SL tablet Place 0.4 mg under the tongue every 5 (five) minutes as needed for chest pain.       . potassium chloride SA (K-DUR,KLOR-CON) 20 MEQ tablet Take 1 tablet (20 mEq total) by mouth daily.  45 tablet  6   No current facility-administered medications for this encounter.    Diovan and Lipitor  REVIEW OF SYSTEMS: All systems negative except as listed in HPI, PMH and Problem list.   LVAD INTERROGATION:   HeartMate II LVAD:  Flow4.8  liters/min, fixed  speed 9400 RPM, power 5.1 PI 6.0      I reviewed the LVAD parameters from today, and compared the results to the patient's prior recorded data.  No programming changes were made.  The LVAD is functioning within specified parameters.  The patient performs LVAD self-test daily.  LVAD interrogation was negative for any significant power changes, alarms or PI events/speed drops.  LVAD equipment check completed and is in good working order.  Back-up equipment present.   LVAD education done on emergency procedures and precautions and reviewed exit site care.    Filed Vitals:   11/29/13 1000  BP: 110/0  Pulse: 80  Height: 5\' 9"  (1.753 m)  Weight: 146 lb 6.4 oz (66.407 kg)  SpO2: 99%    Physical Exam: GENERAL: Well appearing, male who presents to clinic today in no acute distress. HEENT: normal  NECK: Supple, JVPnormal  .  , no bruits.  No lymphadenopathy or thyromegaly appreciated.   CARDIAC:  Mechanical heart sounds with LVAD hum present.  LUNGS:  Clear to auscultation bilaterally.  ABDOMEN:  Soft, round, nontender, positive bowel sounds x4.     LVAD exit site: well-healed and incorporated.  Dressing dry and intact.  No erythema or drainage.  Stabilization device present and accurately applied.  Driveline dressing is being changed alternate days  per sterile technique. EXTREMITIES:  Warm and dry, no cyanosis, clubbing, rash or edema  NEUROLOGIC:  Alert and oriented x 4.  Gait steady.  No aphasia.  No dysarthria.  Affect pleasant.     EKG: Sinus   ASSESSMENT AND PLAN:   1. Status post LVAD implantation - Patient comes to clinic today for routine follow up visit. Currently has NYHA class 2 symptoms on LVAD support. Plan to follow up at next routine visit in one week     2.  Anticoagulation management - INR goal 2.0-3.0.  Will evaluate INR value today and follow up with patient for necessary changes.    3. will increase Aldactone for blood pressure and fluid control   4 Will give appetite  stimulant-Eldertonic-as needed

## 2013-11-29 NOTE — Patient Instructions (Addendum)
1.  Increase Aldactone to 25 mg daily 2.  Take K-dur (potassium supplement) 40 meq today; then begin 20 meq daily 3.  Decrease ferrous iron capsule to every other day 4.  May start Eldertonic 1 spoon before each meal for appetite stimulant 5.  Change coumadin dose to 7.5 mg daily except 5 mg on Monday and Friday 6.  Return to [facility] in one week

## 2013-11-29 NOTE — Progress Notes (Signed)
Patient ID: Johnny Oneill, male   DOB: October 29, 1940, 73 y.o.   MRN: [id number]   HPI: Mr.Angst is a 73 year old patient with a history of CAD s/p stent to LCX 2004 with re-occlusion of LCX with lateral MI 2009 and repeat stenting, total occlusion of RCA, ICM, chronic systolic heart failure, hypothyroidism, HLD and arthritis. S/P LVAD HMII 09/19/2013 currently [facility] Transplant list .   Admitted to [facility] 09/13/13 through 10/03/13. Initially admitted with decompenstaed heart failure and low output. Required IABP for stabilization. He ultimately had LVAD HM II placed on 09/19/13. He suffered from RV failure after the procedure and required milrinone for >7 days which was gradually weaned to off. During his stay he did develop acute ICU psychosis with delirium which was treated with Zyprexa. Once he was transferred out of the ICU this improved and he returned back to baseline. He was not discharged on beta blocker. D/C weight 151 pounds.  He returns for follow up with his wife. Last office visit he was admitted 11/01/13 for syncope and partial bowel obstruction. Also had R hemathorax with CT followed by VATs.[name] He requires short CIR admit. Overall he is feeling much better. Denies SOB/PND/Orthopnea. Mild dizziness with squats. Followed by [facility]. Weight at home 142-143 pounds. Appetite poor. Following low salt diet and limiting fluids to < 2 liters per day. He performs dressing changes.   Denies LVAD alarms.  Denies driveline trauma, erythema or drainage.  Denies ICD shocks.   Reports taking Coumadin as prescribed and adherence to anticoagulation based dietary restrictions.  Denies bright red blood per rectum or melena, no dark urine or hematuria.    ECG: NSR, RBBB   Past Medical History  Diagnosis Date  . Ischemic cardiomyopathy     a. 10/09 Echo: Sev LV Dysfxn, inf/lat AK, Mod MR.  [name] Hypercholesteremia   . Osteoarthritis     a. s/p R TKR  . Anxiety   . Hypothyroidism   . CAD (coronary artery disease)     S/P  stenting of LCX in 2004;  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 Hypertension   . PVC (premature ventricular contraction)   . Pacemaker   . CHF (congestive heart failure)   . Shortness of breath   . Automatic implantable cardioverter-defibrillator in situ     Current Outpatient Prescriptions  Medication Sig Dispense Refill  . aspirin 325 MG tablet Take 1 tablet (325 mg total) by mouth daily.      . citalopram (CELEXA) 10 MG tablet Take 1 tablet (10 mg total) by mouth daily.  30 tablet  1  . Coenzyme Q10 (COQ10) 200 MG CAPS Take 200 mg by mouth daily.      [name] ezetimibe (ZETIA) 10 MG tablet Take 10 mg by mouth daily.      . ferrous Q000111Q C-folic acid (TRINSICON / FOLTRIN) capsule Take 1 capsule by mouth every morning.  30 capsule  3  . levothyroxine (SYNTHROID, LEVOTHROID) 200 MCG tablet Take 1 tablet (200 mcg total) by mouth daily before breakfast.  30 tablet  1  . lisinopril (PRINIVIL,ZESTRIL) 10 MG tablet Take 1 tablet (10 mg total) by mouth daily.  30 tablet  6  . Omega-3 300 MG CAPS Take 300  mg by mouth at bedtime.       . pantoprazole (PROTONIX) 40 MG tablet Take 1 tablet (40 mg total) by mouth daily.  30 tablet  1  . rosuvastatin (CRESTOR) 20 MG tablet Take 20 mg by mouth daily.      [name] spironolactone (ALDACTONE) 12.5 mg TABS tablet Take 0.5 tablets (12.5 mg total) by mouth daily.  30 tablet  1  . temazepam (RESTORIL) 15 MG capsule Take 1 capsule (15 mg total) by mouth at bedtime.  30 capsule  0  . warfarin (COUMADIN) 7.5 MG tablet Take 1 tablet (7.5 mg total) by mouth daily at 6 PM.  30 tablet  1  . nitroGLYCERIN (NITROSTAT) 0.4 MG SL tablet Place 0.4 mg under the tongue every 5 (five) minutes as needed for chest pain.        No current facility-administered medications for  this encounter.   REVIEW OF SYSTEMS: All systems negative except as listed in HPI, PMH and Problem list.  LVAD INTERROGATION:   HeartMate II LVAD:  Flow 4.2  liters/min, speed 9400, power 5.3, PI 7.8. Rare PI events  I reviewed the LVAD parameters from today, and compared the results to the patient's prior recorded data.  No programming changes were made.  The LVAD is functioning within specified parameters.  The patient performs LVAD self-test daily.  LVAD interrogation was negative for any significant power changes, alarms or PI events/speed drops.  LVAD equipment check completed and is in good working order.  Back-up equipment present.   LVAD education done on emergency procedures and precautions and reviewed exit site care.    Filed Vitals:   11/29/13 1000  BP: 110/0  Pulse: 80  Height: 5\' 9"  (1.753 m)  Weight: 146 lb 6.4 oz (66.407 kg)  SpO2: 99%    Physical Exam: GENERAL: Well appearing, male who presents to clinic today. Had syncopal event in the waiting room and required transport into clinic via wheelchair followed by assistance getting on stretcher.  HEENT: normal  NECK: Supple, JVP  ~8+ prominent CV waves  .  2+ bilaterally, no bruits.  No lymphadenopathy or thyromegaly appreciated.   CARDIAC:  Mechanical heart sounds with LVAD hum present.  LUNGS:  Clear to auscultation bilaterally.  ABDOMEN:  Soft, round, nontender, positive bowel sounds x4.     LVAD exit site: well-healed and incorporated.  Dressing dry and intact.  No erythema or drainage.  Stabilization device present and accurately applied.  Driveline dressing is being changed daily per sterile technique. EXTREMITIES:  Warm and dry, no cyanosis, clubbing, rash or edema  NEUROLOGIC:  Alert and oriented x 4.  Gait steady.  No aphasia.  No dysarthria.  Affect pleasant.     ASSESSMENT AND PLAN:   1. Status post LVAD implantation 09/19/13. NYHA II. He returns for post hospital follow up. Admitted with syncopy and had  partial bowel obstruction. Also had R hemothorax that required chest tube and VATS. He continues to improve. Volume status stable. Continue lasix as needed. Today his MAP is 110.  - Will increase spironolactone to 25 mg daily.  If MAP high next week will add 5mg  lisinopril at night. For now continue 10 mg lisinopril daily.   - Check BMET, LDH, CBC, INR today  2. Syncope: resolved.   3. Anticoagulation management: Coumadin for INR goal 2.0-3.0, also on ASA 325 mg daily.  Will evaluate INR value today and follow up with patient for necessary changes.  Dose per protocol.  4.  NSVT: Medtronic ICD. Off amiodarone.  5. Hypothyroidism: Recent TSH elevated, repeat TSH today.   Follow up in one week.   CLEGG,AMY NP-C 10:40 AM  Patient seen with NP and VAD coordinator.  Doing well overall.  MAP is high.  Will be careful with increasing meds given history of orthostatic symptoms.  Today, will increase spironolactone to 25 mg daily. VAD parameters stable.  He will followup in 1 week with labs.   Loralie Champagne 11/29/2013

## 2013-11-30 ENCOUNTER — Telehealth (HOSPITAL_COMMUNITY): Payer: Self-pay | Admitting: Cardiology

## 2013-11-30 DIAGNOSIS — E876 Hypokalemia: Secondary | ICD-10-CM

## 2013-11-30 MED ORDER — POTASSIUM CHLORIDE CRYS ER 20 MEQ PO TBCR: 20.0000 meq | EXTENDED_RELEASE_TABLET | Freq: Every day | ORAL | Status: DC

## 2013-11-30 NOTE — Telephone Encounter (Signed)
pts wife called to request a refill on potassium Restarted on KCl 4/1 wife thought she had some at home rx sent into pharmacy

## 2013-11-30 NOTE — Progress Notes (Signed)
Patient ID: Johnny Oneill, male   DOB: 07-22-1941, 73 y.o.   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 Recently the patient was hospitalized for partial small bowel obstruction. During that hospitalization he required a right chest tube than right VATS for evacuation of a large right hemothorax which was felt to be caused by a central line stick while-being fully anticoagulated. The patient is currently receiving home physical therapy and is able to walk 200 feet and is increasing. The patient denies any bleeding problems and his INR is therapeutic. The patient's appetite has been poor but his weight is stable. The patient is having no symptoms of partial small bowel obstruction and has normal bowel movements. The driveline site is clean and dry, dressings are changed Monday Wednesday Friday and the cord is incorporating well into the soft tissue. All surgical incisions are dry and clean. Follow up: Interrogation of the LVAD demonstrates no PI events, no alarms and normal function  Denies LVAD alarms.  Denies driveline trauma, erythema or drainage.  Denies ICD shocks.   Reports taking Coumadin as prescribed and adherence to anticoagulation based dietary restrictions.  Denies bright red blood per rectum or melena, no dark urine or hematuria.     Past Medical History  Diagnosis Date  . Ischemic cardiomyopathy     a. 10/09 Echo: Sev LV Dysfxn, inf/lat AK, Mod MR.  [name];  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 Hypertension    . PVC (premature ventricular contraction)   . Pacemaker   . CHF (congestive heart failure)   . Shortness of breath   . Automatic implantable cardioverter-defibrillator in situ     Current Outpatient Prescriptions  Medication Sig Dispense Refill  . aspirin 325 MG tablet Take 1 tablet (325 mg total) by mouth daily.      . citalopram (CELEXA) 10 MG tablet Take 1 tablet (10 mg total) by mouth daily.  30 tablet  1  . Coenzyme Q10 (COQ10) 200 MG CAPS Take 200 mg by mouth daily.      [name] ezetimibe (ZETIA) 10 MG tablet Take 10 mg by mouth daily.      . ferrous Q000111Q C-folic acid (TRINSICON / FOLTRIN) capsule Take 1 capsule by mouth every other day.      [name] geriatric multivitamins-minerals (ELDERTONIC/GEVRABON) ELIX Take one spoonful before each meal  240 mL  2  . levothyroxine (SYNTHROID, LEVOTHROID) 200 MCG tablet Take 1 tablet (200 mcg total) by mouth daily before breakfast.  30 tablet  1  . lisinopril (PRINIVIL,ZESTRIL) 10 MG tablet Take 1 tablet (10 mg total) by mouth daily.  30 tablet  6  . nitroGLYCERIN (NITROSTAT) 0.4 MG SL tablet Place 0.4 mg under the tongue every 5 (five) minutes as needed for chest pain.       . Omega-3 300 MG CAPS Take 300 mg by mouth at bedtime.       . pantoprazole (  PROTONIX) 40 MG tablet Take 1 tablet (40 mg total) by mouth daily.  30 tablet  1  . potassium chloride SA (K-DUR,KLOR-CON) 20 MEQ tablet Take 1 tablet (20 mEq total) by mouth daily.  45 tablet  6  . rosuvastatin (CRESTOR) 20 MG tablet Take 20 mg by mouth daily.      [name] spironolactone (ALDACTONE) 25 MG tablet Take 1 tablet (25 mg total) by mouth daily.  30 tablet  1  . temazepam (RESTORIL) 15 MG capsule Take 1 capsule (15 mg total) by mouth at bedtime.  30 capsule  0  . warfarin (COUMADIN) 7.5 MG tablet Take 1 tablet (7.5 mg total) by mouth daily at 6 PM.  30 tablet  1   No current facility-administered medications for this encounter.      Allergies include Lipitor and Diovan   REVIEW OF  SYSTEMS: All systems negative except as listed in HPI, PMH and Problem list.   LVAD INTERROGATION:   HeartMate II LVAD:  Flow 4.8 liters/min, speed 9400 RPM, power 5.1 PI 6.0. .   I reviewed the LVAD parameters from today, and compared the results to the patient's prior recorded data.  No programming changes were made.  The LVAD is functioning within specified parameters.  The patient performs LVAD self-test daily.  LVAD interrogation was negative for any significant power changes, alarms or PI events/speed drops.  LVAD equipment check completed and is in good working order.  Back-up equipment present.   LVAD education done on emergency procedures and precautions and reviewed exit site care.    There were no vitals filed for this visit.  Physical Exam: GENERAL: Well appearing, male who presents to clinic today in no acute distress. HEENT: normal  NECK: Supple, JVP normal, no bruits.  No lymphadenopathy or thyromegaly appreciated.   CARDIAC:  Mechanical heart sounds with LVAD hum present.  LUNGS:  Clear to auscultation bilaterally.  ABDOMEN:  Soft, round, nontender, positive bowel sounds x4.     LVAD exit site: well-healed and incorporated.  Dressing dry and intact.  No erythema or drainage.  Stabilization device present and accurately applied.  Driveline dressing is being changed daily per sterile technique. EXTREMITIES:  Warm and dry, no cyanosis, clubbing, rash or edema  NEUROLOGIC:  Alert and oriented x 4.  Gait steady.  No aphasia.  No dysarthria.  Affect pleasant.     [id number] [id number]  ASSESSMENT AND PLAN:   1. Status post LVAD implantation - Patient comes to clinic today for routine follow up visit. Currently has NYHA class 2 symptoms on LVAD support. Plan to follow up at next routine visit.      2.  Anticoagulation management - INR goal 2.0-3.0.  Will evaluate INR value today and follow up with patient for necessary changes.    3. Will increase Aldactone for blood pressure in  fluid control  4. Will provide Stimulant-Eldertonic-as needed.  5.  Patient returns for LVAD clinic visit in one week. He is not ready to resume driving. He'll try to avoid heavy greasy food

## 2013-12-01 ENCOUNTER — Other Ambulatory Visit (HOSPITAL_COMMUNITY): Payer: Self-pay | Admitting: *Deleted

## 2013-12-01 DIAGNOSIS — Z95811 Presence of heart assist device: Secondary | ICD-10-CM

## 2013-12-01 DIAGNOSIS — Z7901 Long term (current) use of anticoagulants: Secondary | ICD-10-CM

## 2013-12-05 ENCOUNTER — Encounter (HOSPITAL_COMMUNITY): Payer: Medicare Other | Admitting: Cardiothoracic Surgery

## 2013-12-05 ENCOUNTER — Inpatient Hospital Stay (HOSPITAL_COMMUNITY): Admission: RE | Admit: 2013-12-05 | Payer: Medicare Other | Source: Ambulatory Visit

## 2013-12-05 DIAGNOSIS — I129 Hypertensive chronic kidney disease with stage 1 through stage 4 chronic kidney disease, or unspecified chronic kidney disease: Secondary | ICD-10-CM

## 2013-12-05 DIAGNOSIS — I509 Heart failure, unspecified: Secondary | ICD-10-CM

## 2013-12-05 DIAGNOSIS — I2510 Atherosclerotic heart disease of native coronary artery without angina pectoris: Secondary | ICD-10-CM

## 2013-12-05 DIAGNOSIS — I5023 Acute on chronic systolic (congestive) heart failure: Secondary | ICD-10-CM

## 2013-12-06 ENCOUNTER — Ambulatory Visit (HOSPITAL_COMMUNITY): Payer: Self-pay | Admitting: *Deleted

## 2013-12-06 ENCOUNTER — Ambulatory Visit (HOSPITAL_BASED_OUTPATIENT_CLINIC_OR_DEPARTMENT_OTHER)
Admission: RE | Admit: 2013-12-06 | Discharge: 2013-12-06 | Disposition: A | Discharge status: Home / Self Care | Payer: Medicare Other | Source: Ambulatory Visit | Attending: Cardiothoracic Surgery | Admitting: Cardiothoracic Surgery

## 2013-12-06 ENCOUNTER — Ambulatory Visit (HOSPITAL_COMMUNITY)
Admission: RE | Admit: 2013-12-06 | Discharge: 2013-12-06 | Disposition: A | Discharge status: Home / Self Care | Payer: Medicare Other | Source: Ambulatory Visit | Attending: Internal Medicine | Admitting: Internal Medicine

## 2013-12-06 VITALS — BP 110/0 | HR 87 | Ht 69.0 in | Wt 146.2 lb

## 2013-12-06 DIAGNOSIS — I255 Ischemic cardiomyopathy: Secondary | ICD-10-CM

## 2013-12-06 DIAGNOSIS — I5022 Chronic systolic (congestive) heart failure: Secondary | ICD-10-CM

## 2013-12-06 DIAGNOSIS — E890 Postprocedural hypothyroidism: Secondary | ICD-10-CM

## 2013-12-06 DIAGNOSIS — I10 Essential (primary) hypertension: Secondary | ICD-10-CM

## 2013-12-06 DIAGNOSIS — Z7901 Long term (current) use of anticoagulants: Secondary | ICD-10-CM

## 2013-12-06 DIAGNOSIS — Z95811 Presence of heart assist device: Secondary | ICD-10-CM | POA: Insufficient documentation

## 2013-12-06 DIAGNOSIS — I2589 Other forms of chronic ischemic heart disease: Secondary | ICD-10-CM

## 2013-12-06 LAB — CBC
HCT: 37.2 % — ABNORMAL LOW (ref 39.0–52.0)
Hemoglobin: 12 g/dL — ABNORMAL LOW (ref 13.0–17.0)
MCH: 28.4 pg (ref 26.0–34.0)
MCHC: 32.3 g/dL (ref 30.0–36.0)
MCV: 87.9 fL (ref 78.0–100.0)
Platelets: 232 10*3/uL (ref 150–400)
RBC: 4.23 MIL/uL (ref 4.22–5.81)
RDW: 17.3 % — ABNORMAL HIGH (ref 11.5–15.5)
WBC: 7.7 10*3/uL (ref 4.0–10.5)

## 2013-12-06 LAB — BASIC METABOLIC PANEL
BUN: 13 mg/dL (ref 6–23)
CO2: 25 mEq/L (ref 19–32)
Calcium: 9.2 mg/dL (ref 8.4–10.5)
Chloride: 102 mEq/L (ref 96–112)
Creatinine, Ser: 1.18 mg/dL (ref 0.50–1.35)
GFR calc Af Amer: 70 mL/min — ABNORMAL LOW (ref >90)
GFR calc non Af Amer: 61 mL/min — ABNORMAL LOW (ref >90)
Glucose, Bld: 121 mg/dL — ABNORMAL HIGH (ref 70–99)
Potassium: 3.9 mEq/L (ref 3.7–5.3)
Sodium: 140 mEq/L (ref 137–147)

## 2013-12-06 LAB — PRO B NATRIURETIC PEPTIDE: Pro B Natriuretic peptide (BNP): 1754 pg/mL — ABNORMAL HIGH (ref 0–125)

## 2013-12-06 LAB — PROTIME-INR
INR: 3.81 — ABNORMAL HIGH (ref 0.00–1.49)
Prothrombin Time: 36.1 seconds — ABNORMAL HIGH (ref 11.6–15.2)

## 2013-12-06 LAB — LACTATE DEHYDROGENASE: LDH: 357 U/L — ABNORMAL HIGH (ref 94–250)

## 2013-12-06 NOTE — Progress Notes (Addendum)
Patient ID: Johnny Oneill, male   DOB: 05/06/1941, 73 y.o.   MRN: [id number] HPI: The patient is a 73 year old nondiabetic male status post LVAD placement with HeartMate 2   09/18/2013. He returns for routine LVAD clinic visit. Since his last visit he has improved exercise and is receiving home PT. He denies any symptoms of heart failure or angina. The surgical incisions are healing well. The dry line dressing is being changed Monday Wednesday Friday and is without drainage. He denies fever or bleeding complications. He is becoming more mobile. Is not yet driving. His weight has been stable and his appetite is slowly improving. He did have one episode of indigestion after eating a hot dog but had no symptoms of partial small bowel obstruction. He has 2 bowel movements daily taking Colace. His mood and affect is im- proved.  Follow up: Interrogation of the system monitor shows no PI events with good flow rates and no power spikes  or pump alarms. EKG today shows sinus rhythm. His mean arterial pressure  is remaining slightly elevated and his lisinopril dose will be adjusted. His INR today is elevated greater than 3.5 and his Coumadin dose will be adjusted. He had some bleeding from a tape skin trauma.  Denies LVAD alarms.  Denies driveline trauma, erythema or drainage.  Denies ICD shocks.   Reports taking Coumadin as prescribed and adherence to anticoagulation based dietary restrictions.  Denies bright red blood per rectum or melena, no dark urine or hematuria.     Past Medical History  Diagnosis Date  . Ischemic cardiomyopathy     a. 10/09 Echo: Sev LV Dysfxn, inf/lat AK, Mod MR.  [name];  s/p Lat MI 2009 with occlusion of the LCX - treated with Promus stenting. Has total occlusion of the RCA.   . ICD (implantable cardiac defibrillator) in place    PROPHYLACTIC      medtronic  . RBBB   . 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 Hypertension   . PVC (premature ventricular contraction)   . Pacemaker   . CHF (congestive heart failure)   . Shortness of breath   . Automatic implantable cardioverter-defibrillator in situ     Current Outpatient Prescriptions  Medication Sig Dispense Refill  . aspirin 325 MG tablet Take 1 tablet (325 mg total) by mouth daily.      . citalopram (CELEXA) 10 MG tablet Take 1 tablet (10 mg total) by mouth daily.  30 tablet  1  . Coenzyme Q10 (COQ10) 200 MG CAPS Take 200 mg by mouth daily.      [name] ezetimibe (ZETIA) 10 MG tablet Take 10 mg by mouth daily.      . ferrous Q000111Q C-folic acid (TRINSICON / FOLTRIN) capsule Take 1 capsule by mouth every other day.      [name] geriatric multivitamins-minerals (ELDERTONIC/GEVRABON) ELIX Take one spoonful before each meal  240 mL  2  . levothyroxine (SYNTHROID, LEVOTHROID) 200 MCG tablet Take 1 tablet (200 mcg total) by mouth daily before breakfast.  30 tablet  1  . lisinopril (PRINIVIL,ZESTRIL) 10 MG tablet Take 10 mg by mouth daily. Take one tab in the morning and 1/2 tab in the evening      . nitroGLYCERIN (NITROSTAT) 0.4 MG SL tablet Place 0.4 mg  under the tongue every 5 (five) minutes as needed for chest pain.       . Omega-3 300 MG CAPS Take 300 mg by mouth at bedtime.       . pantoprazole (PROTONIX) 40 MG tablet Take 1 tablet (40 mg total) by mouth daily.  30 tablet  1  . potassium chloride SA (K-DUR,KLOR-CON) 20 MEQ tablet Take 20 mEq by mouth. Take one every other day      . rosuvastatin (CRESTOR) 20 MG tablet Take 20 mg by mouth daily.      [name] spironolactone (ALDACTONE) 25 MG tablet Take 1 tablet (25 mg total) by mouth daily.  30 tablet  1  . temazepam (RESTORIL) 15 MG capsule Take 1 capsule (15 mg total) by mouth at bedtime.  30 capsule  0  . warfarin (COUMADIN) 7.5 MG tablet Take 1 tablet (7.5 mg total) by mouth daily at 6 PM.  30 tablet  1   No current  facility-administered medications for this encounter.    Diovan and Lipitor  REVIEW OF SYSTEMS: All systems negative except as listed in HPI, PMH and Problem list.   LVAD INTERROGATION:   HeartMate II LVAD:  Flow 4.2 liters/min, speed 9400 RPM, power 5.1  PI 6.8 .   I reviewed the LVAD parameters from today, and compared the results to the patient's prior recorded data.  No programming changes were made.  The LVAD is functioning within specified parameters.  The patient performs LVAD self-test daily.  LVAD interrogation was negative for any significant power changes, alarms or PI events/speed drops.  LVAD equipment check completed and is in good working order.  Back-up equipment present.   LVAD education done on emergency procedures and precautions and reviewed exit site care.    Pulse 87, weight 146, O2 saturation 99%, mean arterial pressure 88 mmHg  Physical Exam: GENERAL: Well appearing, male who presents to clinic today in no acute distress. HEENT: normal  NECK: Supple, JVP normal ., no bruits.  No lymphadenopathy or thyromegaly appreciated.   CARDIAC:  Mechanical heart sounds with LVAD hum present.  LUNGS:  Clear to auscultation bilaterally.  ABDOMEN:  Soft, round, nontender, positive bowel sounds x4.     LVAD exit site: well-healed and incorporated.  Dressing dry and intact.  No erythema or drainage.  Stabilization device present and accurately applied.  Driveline dressing is being changed 3 times per week per sterile technique. Small eschar was debrided from the driveline site consisting of dried skin. No evidence of infection drainage or cellulitis  EXTREMITIES:  Warm and dry, no cyanosis, clubbing, rash or edema  NEUROLOGIC:  Alert and oriented x 4.  Gait steady.  No aphasia.  No dysarthria.  Affect pleasant.     EKG: Normal sinus rhythm  ASSESSMENT AND PLAN:   1. Status post LVAD implantation - Patient comes to clinic today for routine follow up visit. Currently has NYHA  class 2 symptoms on LVAD support. Plan to follow up at next routine visit.      2.  Anticoagulation management - INR goal 2.0-3.0.  Will evaluate INR value today and follow up with patient for necessary changes. His Coumadin will be adjusted for a elevated INR 3.8  3 drive line dressing change will be reset at weekly.   4 he'll return in one week for blood pressure and wound check and INR check. Consider echocardiogram to evaluate RV function.His LV function is very poor so I would doubt that his aortic valve  will open even at low speed RPM's during a ramp echo  5. The patient will contact VAD team immediately for any recurrent symptoms of partial bowel obstruction

## 2013-12-06 NOTE — Progress Notes (Signed)
Symptom  Yes  No  Details   Angina        x  Ativity:   Claudication        x How far:   Syncope              x When:    Stroke        x   Orthopnea        x How many pillows:   one  PND        x How often:  CPAP    N/A  How many hrs:   Pedal edema        x        left ankle; clears overnight  Abd fullness             x   N&V        x Improving appetite; eating full meals  Diaphoresis        x When:  Bleeding           x     Urine color        light yellow  SOB            x Activity:   incline  Palpitations        x When:   ICD shock        x   Hospitlizaitons              x When/where/why:   ED visit        x When/where/why:  Other MD        x When/who/why:  Activity     Home PT twice weekly  Fluid     No limitations < 1000  Diet     Low salt   Vital signs: HR: 87 MAP BP:  110 O2 Sat: 99% Wt:   146.2 lbs Last weight: 146.4 Ht: 5'9"  LVAD interrogation reveals:  Speed:  9400 Flow:  4.0 Power:  5.1 PI: 7.3 Alarms:  none Events:  Rare PI Fixed speed:  9400 Low speed limit:  8800  LVAD exit site:   Partially healed and incorporated. The velour is fully implanted at exit site. Gauze dressing with aquacel strip dry and intact. Dressing change using sterile technique, remains erythematous under aquacel strip, no drainage or foul odor noted; pt denies tenderness.  Stabilization device present and accurately applied. Driveline dressing is being changed MWF per sterile technique using guaze dressing with aquacel strip on exit site. Pt denies fever or chills.  Will advance to weekly dressing changes per Dr. Donata Clay. Demonstrated how to use weekly dressing kit with proper application of Sorbaview dressing and biopatch on exit site.  Dressing supplies provided to patient/wife. No showers at this time; instructed wife to call if any increased redness, drainage, tenderness, or foul odor noted. Instructed to change dressing if necessary to keep drive line site dry. Wife will call for  nurse check of drive line site if any changes occur.  I reviewed the LVAD parameters from today, and compared the results to the patient's prior recorded data. No programming changes were made. The LVAD is functioning within specified parameters.  LVAD interrogation was negative for any significant power changes or alarms; a few PI events/speed drops present which is consistent with the patient's history.    Pt/caregiver deny any alarms or VAD equipment issues. VAD coordinator reviewed daily log from home for daily temperature, weight, and VAD parameters. Pt  is performing daily controller and system monitor self tests along with completing weekly and monthly maintenance for LVAD equipment.   LVAD equipment check completed and is in good working order. Back-up equipment present. LVAD education done on emergency procedures and precautions and reviewed exit site care.

## 2013-12-06 NOTE — Progress Notes (Addendum)
Patient ID: Johnny Oneill, male   DOB: 1940-09-29, 73 y.o.   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   Admitted 3/4-3/25/15 with SOB and syncopal episode. Found to have SBO and NG placed and started on TPN. On 11/06/13 developed progressive SOB and hypotension and co-ox 51% and Hgb 6.7. Started on milrinone and transfused. ECHo showed nl RV function. Patient had PEA arrest and was intubated and started on pressors. CXR revelaed a R hemothorax and a R CT placed. Taked to OR for VATs  and evacuation of hemothorax and then was transferred to CIR.   Follow up: Last visit increased spiro to 25 mg daily for elevated MAPs. Denies SOB, orthopnea, or CP. Ambulating well and denies SOB. Still working with [facility]. Weights stable. Denies dizziness.   Denies LVAD alarms.  Denies driveline trauma, erythema or drainage.  Denies ICD shocks.    Reports taking Coumadin as prescribed and adherence to anticoagulation based dietary restrictions.  Denies bright red blood per rectum or melena, no dark urine or hematuria.     Past Medical History  Diagnosis Date  . Ischemic cardiomyopathy     a. 10/09 Echo: Sev LV Dysfxn, inf/lat AK, Mod MR.  [name];  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 Hypertension   . PVC (premature ventricular contraction)   . Pacemaker   . CHF (congestive heart failure)   . Shortness of breath   . Automatic implantable cardioverter-defibrillator in situ     Current Outpatient Prescriptions  Medication Sig  Dispense Refill  . aspirin 325 MG tablet Take 1 tablet (325 mg total) by mouth daily.      . citalopram (CELEXA) 10 MG tablet Take 1 tablet (10 mg total) by mouth daily.  30 tablet  1  . Coenzyme Q10 (COQ10) 200 MG CAPS Take 200 mg by mouth daily.      [name] ezetimibe (ZETIA) 10 MG tablet Take 10 mg by mouth daily.      . ferrous Q000111Q C-folic acid (TRINSICON / FOLTRIN) capsule Take 1 capsule by mouth every other day.      [name] geriatric multivitamins-minerals (ELDERTONIC/GEVRABON) ELIX Take one spoonful before each meal  240 mL  2  . levothyroxine (SYNTHROID, LEVOTHROID) 200 MCG tablet Take 1 tablet (200 mcg total) by mouth daily before breakfast.  30 tablet  1  . lisinopril (PRINIVIL,ZESTRIL) 10 MG tablet Take 1 tablet (10 mg total) by mouth daily.  30 tablet  6  . Omega-3 300 MG CAPS Take 300 mg by mouth at bedtime.       . pantoprazole (PROTONIX) 40 MG tablet Take 1 tablet (40 mg total) by mouth daily.  30 tablet  1  . potassium chloride SA (K-DUR,KLOR-CON) 20 MEQ tablet Take 1 tablet (20 mEq total) by mouth daily.  45 tablet  6  . rosuvastatin (CRESTOR) 20 MG tablet  Take 20 mg by mouth daily.      [name] spironolactone (ALDACTONE) 25 MG tablet Take 1 tablet (25 mg total) by mouth daily.  30 tablet  1  . temazepam (RESTORIL) 15 MG capsule Take 1 capsule (15 mg total) by mouth at bedtime.  30 capsule  0  . warfarin (COUMADIN) 7.5 MG tablet Take 1 tablet (7.5 mg total) by mouth daily at 6 PM.  30 tablet  1  . nitroGLYCERIN (NITROSTAT) 0.4 MG SL tablet Place 0.4 mg under the tongue every 5 (five) minutes as needed for chest pain.        No current facility-administered medications for this encounter.    Diovan and Lipitor  REVIEW OF SYSTEMS: All systems negative except as listed in HPI, PMH and Problem list.  Filed Vitals:   12/06/13 1119  BP: 110/0  Pulse: 87  Height: 5\' 9"  (1.753 m)  Weight: 146 lb 3.2 oz (66.316 kg)  SpO2: 99%   LVAD INTERROGATION:   HeartMate II LVAD:   Speed: 9400 Flow 4.0 Power: 5.1 PI: 7.3 Alarms: None Events: Rare Fixed Speed: 9400 Low speed: 8800     I reviewed the LVAD parameters from today, and compared the results to the patient's prior recorded data.  No programming changes were made.  The LVAD is functioning within specified parameters.  The patient performs LVAD self-test daily.  LVAD interrogation was negative for any significant power changes, alarms or PI events/speed drops.  LVAD equipment check completed and is in good working order.  Back-up equipment present.   LVAD education done on emergency procedures and precautions and reviewed exit site care.    Filed Vitals:   12/06/13 1119  BP: 110/0  Pulse: 87  Height: 5\' 9"  (1.753 m)  Weight: 146 lb 3.2 oz (66.316 kg)  SpO2: 99%    Physical Exam: GENERAL: Well appearing, male who presents to clinic today in no acute distress; Wife present HEENT: normal  NECK: Supple, JVP flat; no bruits.  No lymphadenopathy or thyromegaly appreciated.   CARDIAC:  Mechanical heart sounds with LVAD hum present.  LUNGS:  Clear to auscultation bilaterally.  ABDOMEN:  Soft, round, nontender, positive bowel sounds x4.     LVAD exit site: well-healed and semi incorporated.  Dressing dry and intact.  No erythema or drainage.  Stabilization device present and accurately applied.  Driveline dressing is being changed alternate days  per sterile technique. EXTREMITIES:  Warm and dry, no cyanosis, clubbing, rash or edema  NEUROLOGIC:  Alert and oriented x 4.  Gait steady.  No aphasia.  No dysarthria.  Affect pleasant.     ASSESSMENT AND PLAN:   1) Chronic systolic HF: ICM, EF 0000000 s/p LVAD HM II implant 08/2013 - NYHA II symptoms and volume status stable. He is not on any lasix, will continue spironolactone 25 mg daily. - MAP slightly elevated, will increase lisinopril to 10 mg q am and 5 mg q pm. Cut K+ back to 20 meq daily. - Not on BB with history of RV failure, if MAPs continue to be  elevated and he is doing well would repeat ECHO and then assess whether could start low dose BB. - Reinforced the need and importance of daily weights, a low sodium diet, and fluid restriction (less than 2 L a day). Instructed to call the HF clinic if weight increases more than 3 lbs overnight or 5 lbs in a week.  2) LVAD - Doing well. All LVAD parameters nl. Will  check LDH, CBD, INR and BMET today.  - As above MAP elevated increase ACE-I - appetitie improving - Currently status 7 on tx list at [facility] 3) Chronic anticoagulation - INR goal 2.0-3.0. No bleeding issues. Will get INR today and adjust accordingly. - Continue ASA 325 mg for LVAD 4) HTN - Above increase lisinopril and rechek BMET in 7-10 days. 5) Hypothyroidism - Follow up with PCP. Last TSH and Free T4 nl.  F/U 2 weeks Rande Brunt NP-C 12:49 PM

## 2013-12-06 NOTE — Patient Instructions (Signed)
1.  Hold coumadin; take 5 mg tomorrow. Then change dose to 7.5 mg daily except 5 mg on Mon/Wed/Fri.  2.  Increase Lisinopril to 10 mg in the morning and 5 mg (1/2 tab) in the evening. 3.  Decrease potassium to 20 meq every other day. 4.  Re-check INR per [facility] next week (Mon or Tues) 5.  VAD clinic in two weeks 6.  Call [facility] if drive line exit site drains or changes in appearance

## 2013-12-09 ENCOUNTER — Telehealth (HOSPITAL_COMMUNITY): Payer: Self-pay | Admitting: *Deleted

## 2013-12-11 ENCOUNTER — Other Ambulatory Visit (HOSPITAL_COMMUNITY): Payer: Self-pay | Admitting: *Deleted

## 2013-12-11 ENCOUNTER — Telehealth (HOSPITAL_COMMUNITY): Payer: Self-pay | Admitting: Cardiology

## 2013-12-11 DIAGNOSIS — Z7901 Long term (current) use of anticoagulants: Secondary | ICD-10-CM

## 2013-12-11 DIAGNOSIS — Z95811 Presence of heart assist device: Secondary | ICD-10-CM

## 2013-12-11 NOTE — Telephone Encounter (Signed)
Wife called to report pt has had a few "light headed spells" since increasing his Aldactone last clinic visit. Denies syncope or VAD alarms. Dr. Haroldine Laws updated - instructed wife to stop afternoon dose of Aldactone 12.5 and make sure patient drinks 2 liters/fluid per day. Will schedule nurse visit next week for labs and BP check. Wife will call Monday to schedule appt.

## 2013-12-11 NOTE — Telephone Encounter (Signed)
pts wife called with concerns Arlyce Harman recently d/c because of dizziness Weight is decreasing 2 lbs daily Wife concerned this maybe related to his thyroid, may need labs  Will come in for nurse visit 12/12/13 per Shella Spearing

## 2013-12-12 ENCOUNTER — Ambulatory Visit (HOSPITAL_COMMUNITY)
Admission: RE | Admit: 2013-12-12 | Discharge: 2013-12-12 | Disposition: A | Discharge status: Home / Self Care | Payer: Medicare Other | Source: Ambulatory Visit | Attending: Internal Medicine | Admitting: Internal Medicine

## 2013-12-12 ENCOUNTER — Ambulatory Visit (HOSPITAL_COMMUNITY): Payer: Self-pay | Admitting: *Deleted

## 2013-12-12 ENCOUNTER — Other Ambulatory Visit (HOSPITAL_COMMUNITY): Payer: Self-pay | Admitting: *Deleted

## 2013-12-12 DIAGNOSIS — Z7901 Long term (current) use of anticoagulants: Secondary | ICD-10-CM | POA: Insufficient documentation

## 2013-12-12 DIAGNOSIS — I5022 Chronic systolic (congestive) heart failure: Secondary | ICD-10-CM

## 2013-12-12 DIAGNOSIS — Z95811 Presence of heart assist device: Secondary | ICD-10-CM

## 2013-12-12 LAB — CBC
HCT: 38.2 % — ABNORMAL LOW (ref 39.0–52.0)
Hemoglobin: 12 g/dL — ABNORMAL LOW (ref 13.0–17.0)
MCH: 27.8 pg (ref 26.0–34.0)
MCHC: 31.4 g/dL (ref 30.0–36.0)
MCV: 88.4 fL (ref 78.0–100.0)
Platelets: 210 10*3/uL (ref 150–400)
RBC: 4.32 MIL/uL (ref 4.22–5.81)
RDW: 17 % — ABNORMAL HIGH (ref 11.5–15.5)
WBC: 5.8 10*3/uL (ref 4.0–10.5)

## 2013-12-12 LAB — BASIC METABOLIC PANEL
BUN: 15 mg/dL (ref 6–23)
CO2: 26 mEq/L (ref 19–32)
Calcium: 9.2 mg/dL (ref 8.4–10.5)
Chloride: 101 mEq/L (ref 96–112)
Creatinine, Ser: 1.32 mg/dL (ref 0.50–1.35)
GFR calc Af Amer: 61 mL/min — ABNORMAL LOW (ref >90)
GFR calc non Af Amer: 53 mL/min — ABNORMAL LOW (ref >90)
Glucose, Bld: 94 mg/dL (ref 70–99)
Potassium: 4.3 mEq/L (ref 3.7–5.3)
Sodium: 140 mEq/L (ref 137–147)

## 2013-12-12 LAB — PROTIME-INR
INR: 3.31 — ABNORMAL HIGH (ref 0.00–1.49)
Prothrombin Time: 32.4 seconds — ABNORMAL HIGH (ref 11.6–15.2)

## 2013-12-12 NOTE — Progress Notes (Signed)
Symptom  Yes  No  Details   Angina        x  Ativity:   Claudication        x How far:   Syncope              x When:  Dizzy/lightheaded - cleared after stopping evening lisinopril   Stroke        x   Orthopnea        x How many pillows:   one  PND        x How often:  CPAP    N/A  How many hrs:   Pedal edema              x   Abd fullness             x   N&V        x good appetite; eating full meals  Diaphoresis        x When:  Bleeding           x     Urine color        light yellow  SOB            x Activity:     Palpitations        x When:   ICD shock        x   Hospitlizaitons              x When/where/why:   ED visit        x When/where/why:  Other MD        x When/who/why:  Activity     Home PT twice weekly  Fluid     No limitations < 1000  Diet     Low salt   Vital signs: HR: 85 MAP BP: 90 O2 Sat: 100 % Wt:   145.2 lbs Last weight: 146.2 lbs Ht: 5'9"  LVAD interrogation reveals:  Speed:  9400 Flow:  4.0 Power:  5.1 PI: 5.1 Alarms:  none Events:  Rare PI Fixed speed:  9400 Low speed limit:  8800  Pt's wife stopped evening Lisinopril (not aldactone as previously reported) Saturday 12/09/13 due to dizzy episodes; dizziness has improved. BP today 90. Junie Bame, NP updated - will not restart evening lisinopril.  Wife and patient concerned over continued weight loss; state they have seen this in the past with thyroid issues.  Pt has appt with Dr. Dwyane Dee, endocrinologist, this Friday 12/15/13. Pertinent hospital notes and labs faxed to his office.   LVAD exit site:   Partially healed and incorporated. The velour is fully implanted at exit site. Sorbaview dressing with biopatch intact. Dressing change using sterile technique, remains slightly erythematous under biopatch. No drainage or foul odor noted; pt denies tenderness.  Stabilization device present and accurately applied. Driveline dressing is being changed weekly per sterile technique using sorbaview dressing. Pt  denies fever or chills.   I reviewed the LVAD parameters from today, and compared the results to the patient's prior recorded data. No programming changes were made. The LVAD is functioning within specified parameters.  LVAD interrogation was negative for any significant power changes or alarms; a few PI events/speed drops present which is consistent with the patient's history.    Pt/caregiver deny any alarms or VAD equipment issues. VAD coordinator reviewed daily log from home for daily temperature, weight, and VAD parameters. Pt is performing daily controller and system monitor self tests along  with completing weekly and monthly maintenance for LVAD equipment.   LVAD equipment check completed and is in good working order. Back-up equipment present. LVAD education done on emergency procedures and precautions and reviewed exit site care.

## 2013-12-13 ENCOUNTER — Telehealth: Payer: Self-pay | Admitting: Internal Medicine

## 2013-12-13 ENCOUNTER — Encounter: Payer: Self-pay | Admitting: Internal Medicine

## 2013-12-13 NOTE — Telephone Encounter (Signed)
12-13-13 SENT PAST DUE LETTER FOR PT TO SET UP DEFIB CK WITH [facility]/MT

## 2013-12-15 ENCOUNTER — Ambulatory Visit (INDEPENDENT_AMBULATORY_CARE_PROVIDER_SITE_OTHER): Payer: Medicare Other | Admitting: Endocrinology

## 2013-12-15 ENCOUNTER — Encounter: Payer: Self-pay | Admitting: Endocrinology

## 2013-12-15 VITALS — HR 95 | Temp 98.3°F | Resp 14 | Ht 68.0 in | Wt 147.8 lb

## 2013-12-15 DIAGNOSIS — E890 Postprocedural hypothyroidism: Secondary | ICD-10-CM

## 2013-12-15 DIAGNOSIS — I2589 Other forms of chronic ischemic heart disease: Secondary | ICD-10-CM

## 2013-12-15 MED ORDER — LEVOTHYROXINE SODIUM 175 MCG PO TABS: 175.0000 ug | ORAL_TABLET | Freq: Every day | ORAL | Status: DC

## 2013-12-15 NOTE — Progress Notes (Signed)
Patient ID: Johnny Oneill, male   DOB: 1941-08-27, 73 y.o.   MRN: [id number]   Reason for Appointment:  Hypothyroidism, followup visit    History of Present Illness:   The hypothyroidism was first diagnosed  after radioactive iodine treatment for [name] several years ago  Complaints are reported by the patient now are weak and lightheaded Also has lost significant amount of weight since he has been treated for his worsening heart failure this year On his previous visit in 02/2013 his TSH was excellent at 1.7 with a regimen of 150 mcg Synthroid, 7-1/2 tablets per week  However his TSH level has been consistently high since 09/13/13         It appears that he has been taking 200 mcg daily since last month when he was hospitalized, probably for about 3 weeks although not clear how much he was being given when he was admitted to the rehabilitation floor  He still feels somewhat weak and lightheaded and is concerned about his difficulty gaining weight He also has been anemic and has been taking iron tablets for at least 3 weeks, currently taking this with his levothyroxine in the morning Because of his tendency to weight loss the last 3 days he has taken only 150 mcg daily           Wt Readings from Last 3 Encounters:  12/15/13 147 lb 12.8 oz (67.042 kg)  12/12/13 145 lb 3.2 oz (65.862 kg)  11/29/13 146 lb 6.4 oz (66.407 kg)    Compliance with the medical regimen has been as prescribed with taking the tablet in the morning before breakfast.  Lab Results  Component Value Date   TSH 7.760* 11/29/2013   TSH 26.457* 11/01/2013   TSH 12.878* 09/13/2013   TSH 1.71 03/14/2013       Medication List       This list is accurate as of: 12/15/13  4:04 PM.  Always use your most recent med list.               aspirin 325 MG tablet  Take 1 tablet (325 mg total) by mouth daily.     citalopram 10 MG tablet  Commonly known as:  CELEXA  Take 1 tablet (10 mg total) by mouth daily.     CoQ10 200 MG Caps  Take 200 mg by mouth daily.     ezetimibe 10 MG tablet  Commonly known as:  ZETIA  Take 10 mg by mouth daily.     ferrous Q000111Q C-folic acid capsule  Commonly known as:  TRINSICON / FOLTRIN  Take 1 capsule by mouth every other day.     geriatric multivitamins-minerals Elix  Take one spoonful before each meal     levothyroxine 200 MCG tablet  Commonly known as:  SYNTHROID, LEVOTHROID  Take 1 tablet (200 mcg total) by mouth daily before breakfast.     lisinopril 10 MG tablet  Commonly known as:  PRINIVIL,ZESTRIL  Take 10 mg by mouth daily.     nitroGLYCERIN 0.4 MG SL tablet  Commonly known as:  NITROSTAT  Place 0.4 mg under the tongue every 5 (five) minutes as needed for chest pain.     Omega-3 300 MG Caps  Take 300 mg by mouth at bedtime.     pantoprazole 40 MG tablet  Commonly known as:  PROTONIX  Take 1 tablet (40 mg total) by mouth daily.     potassium chloride SA 20 MEQ tablet  Commonly known as:  K-DUR,KLOR-CON  Take 20 mEq by mouth. Take one every other day     rosuvastatin 20 MG tablet  Commonly known as:  CRESTOR  Take 20 mg by mouth daily.     spironolactone 25 MG tablet  Commonly known as:  ALDACTONE  Take 1 tablet (25 mg total) by mouth daily.     temazepam 15 MG capsule  Commonly known as:  RESTORIL  Take 1 capsule (15 mg total) by mouth at bedtime.     traMADol 50 MG tablet  Commonly known as:  ULTRAM     warfarin 7.5 MG tablet  Commonly known as:  COUMADIN  Take 1 tablet (7.5 mg total) by mouth daily at 6 PM.         Past Medical History  Diagnosis Date  . Ischemic cardiomyopathy     a. 10/09 Echo: Sev LV Dysfxn, inf/lat AK, Mod MR.  [name];  s/p Lat MI 2009 with occlusion of the LCX - treated with Promus stenting. Has total occlusion of the RCA.   . ICD  (implantable cardiac defibrillator) in place     PROPHYLACTIC      medtronic  . RBBB   . 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 Hypertension   . PVC (premature ventricular contraction)   . Pacemaker   . CHF (congestive heart failure)   . Shortness of breath   . Automatic implantable cardioverter-defibrillator in situ     Past Surgical History  Procedure Laterality Date  . Defibrillator  2009  . Total knee arthroplasty  11/27/11    left  . Insert / replace / remove pacemaker  2009  . Coronary angioplasty with stent placement  2004  . Coronary angioplasty with stent placement  07/2008  . Knee arthroscopy w/ partial medial meniscectomy  12/2002    left  . Knee arthroscopy      bilaterally  . Total knee arthroplasty  11/27/2011    Procedure: TOTAL KNEE ARTHROPLASTY;  Surgeon: Yvette Rack., MD;  Location: [place];  Service: Orthopedics;  Laterality: Left;  left total knee arthroplasty  . Esophagogastroduodenoscopy N/A 09/15/2013    Procedure: ESOPHAGOGASTRODUODENOSCOPY (EGD);  Surgeon: Ladene Artist, MD;  Location[facility] ENDOSCOPY;  Service: Endoscopy;  Laterality: N/A;  bedside  . Insertion of implantable left ventricular assist device N/A 09/18/2013    Procedure: INSERTION OF IMPLANTABLE LEFT VENTRICULAR ASSIST DEVICE;  Surgeon: Ivin Poot, MD;  Location: [place];  Service: Open Heart Surgery;  Laterality: N/A;  CIRC ARREST  NITRIC OXIDE  . Intraoperative transesophageal echocardiogram N/A 09/18/2013    Procedure: INTRAOPERATIVE TRANSESOPHAGEAL ECHOCARDIOGRAM;  Surgeon: Ivin Poot, MD;  Location: [place];  Service: Open Heart Surgery;  Laterality: N/A;  . Video assisted thoracoscopy Right 11/06/2013    Procedure: VIDEO ASSISTED THORACOSCOPY;  Surgeon: Gaye Pollack, MD;  Location: [place];  Service: Cardiothoracic;  Laterality: Right;  . Hematoma evacuation Right 11/06/2013    Procedure: EVACUATION HEMATOMA;  Surgeon: Gaye Pollack, MD;  Location: [place];  Service: Cardiothoracic;  Laterality:  Right;    Family History  Problem Relation Age of Onset  . Heart failure Father   . Stroke Father   . Coronary artery disease Mother   . Heart disease  Mother   . Hyperlipidemia Sister     Social History:  reports that he has never smoked. He has never used smokeless tobacco. He reports that he drinks alcohol. He reports that he does not use illicit drugs.  Allergies:  Allergies  Allergen Reactions  . Diovan [Valsartan] Other (See Comments)    Hypotension at low dose, hyperkalemia  . Lipitor [Atorvastatin Calcium] Other (See Comments)    Muscle pain     Examination:   BP   Pulse 95  Temp(Src) 98.3 F (36.8 C)  Resp 14  Ht 5\' 8"  (1.727 m)  Wt 147 lb 12.8 oz (67.042 kg)  BMI 22.48 kg/m2  SpO2 98%   GENERAL APPEARANCE:  averagely built and nourished, mildly anxious   FACE: No puffiness of face or periorbital edema.             NEUROLOGIC EXAM:  biceps reflexes appear normal    Assessment/Plan:  Post ablative Hypothyroidism:  He has needed somewhat larger doses of his levothyroxine despite his weight loss Also recently had been taking iron with his thyroid supplement in the morning which may interact with it. His TSH done about 2 weeks ago was checked only about a week or so after going up on his dose to 200 mcg and may not be representative of his actual levels now Most likely will need about 175 mcg and will try this for now Also he will stop taking iron in the morning and take it at  lunchtime  He will need followup levels in about 6 weeks  Elayne Snare 12/15/2013, 4:04 PM

## 2013-12-15 NOTE — Patient Instructions (Addendum)
Synthroid 175 daily   Avoid taking any calcium or iron supplements with the thyroid supplement.

## 2013-12-20 ENCOUNTER — Encounter (HOSPITAL_COMMUNITY): Payer: Self-pay

## 2013-12-20 ENCOUNTER — Ambulatory Visit (HOSPITAL_COMMUNITY)
Admission: RE | Admit: 2013-12-20 | Discharge: 2013-12-20 | Disposition: A | Discharge status: Home / Self Care | Payer: Medicare Other | Source: Ambulatory Visit | Attending: Cardiothoracic Surgery | Admitting: Cardiothoracic Surgery

## 2013-12-20 ENCOUNTER — Ambulatory Visit (HOSPITAL_COMMUNITY)
Admission: RE | Admit: 2013-12-20 | Discharge: 2013-12-20 | Disposition: A | Discharge status: Home / Self Care | Payer: Medicare Other | Source: Ambulatory Visit | Attending: Internal Medicine | Admitting: Internal Medicine

## 2013-12-20 ENCOUNTER — Ambulatory Visit (HOSPITAL_COMMUNITY): Payer: Self-pay | Admitting: *Deleted

## 2013-12-20 ENCOUNTER — Other Ambulatory Visit (HOSPITAL_COMMUNITY): Payer: Self-pay | Admitting: *Deleted

## 2013-12-20 VITALS — BP 104/0 | HR 86 | Wt 147.4 lb

## 2013-12-20 DIAGNOSIS — Z95811 Presence of heart assist device: Secondary | ICD-10-CM

## 2013-12-20 DIAGNOSIS — I10 Essential (primary) hypertension: Secondary | ICD-10-CM | POA: Insufficient documentation

## 2013-12-20 DIAGNOSIS — G4700 Insomnia, unspecified: Secondary | ICD-10-CM

## 2013-12-20 DIAGNOSIS — Z7901 Long term (current) use of anticoagulants: Secondary | ICD-10-CM | POA: Insufficient documentation

## 2013-12-20 DIAGNOSIS — E039 Hypothyroidism, unspecified: Secondary | ICD-10-CM | POA: Insufficient documentation

## 2013-12-20 DIAGNOSIS — I5022 Chronic systolic (congestive) heart failure: Secondary | ICD-10-CM | POA: Insufficient documentation

## 2013-12-20 LAB — COMPREHENSIVE METABOLIC PANEL
ALT: 25 U/L (ref 0–53)
AST: 30 U/L (ref 0–37)
Albumin: 3.3 g/dL — ABNORMAL LOW (ref 3.5–5.2)
Alkaline Phosphatase: 109 U/L (ref 39–117)
BUN: 13 mg/dL (ref 6–23)
CO2: 26 mEq/L (ref 19–32)
Calcium: 9 mg/dL (ref 8.4–10.5)
Chloride: 101 mEq/L (ref 96–112)
Creatinine, Ser: 1.23 mg/dL (ref 0.50–1.35)
GFR calc Af Amer: 67 mL/min — ABNORMAL LOW (ref >90)
GFR calc non Af Amer: 58 mL/min — ABNORMAL LOW (ref >90)
Glucose, Bld: 99 mg/dL (ref 70–99)
Potassium: 3.9 mEq/L (ref 3.7–5.3)
Sodium: 140 mEq/L (ref 137–147)
Total Bilirubin: 0.5 mg/dL (ref 0.3–1.2)
Total Protein: 7.6 g/dL (ref 6.0–8.3)

## 2013-12-20 LAB — CBC
HCT: 36.6 % — ABNORMAL LOW (ref 39.0–52.0)
Hemoglobin: 11.5 g/dL — ABNORMAL LOW (ref 13.0–17.0)
MCH: 27.6 pg (ref 26.0–34.0)
MCHC: 31.4 g/dL (ref 30.0–36.0)
MCV: 87.8 fL (ref 78.0–100.0)
Platelets: 209 10*3/uL (ref 150–400)
RBC: 4.17 MIL/uL — ABNORMAL LOW (ref 4.22–5.81)
RDW: 16.8 % — ABNORMAL HIGH (ref 11.5–15.5)
WBC: 5.8 10*3/uL (ref 4.0–10.5)

## 2013-12-20 LAB — LACTATE DEHYDROGENASE: LDH: 287 U/L — ABNORMAL HIGH (ref 94–250)

## 2013-12-20 LAB — PROTIME-INR
INR: 3.96 — ABNORMAL HIGH (ref 0.00–1.49)
Prothrombin Time: 37.2 seconds — ABNORMAL HIGH (ref 11.6–15.2)

## 2013-12-20 LAB — PRO B NATRIURETIC PEPTIDE: Pro B Natriuretic peptide (BNP): 1474 pg/mL — ABNORMAL HIGH (ref 0–125)

## 2013-12-20 MED ORDER — TEMAZEPAM 15 MG PO CAPS: 15.0000 mg | ORAL_CAPSULE | Freq: Every day | ORAL | Status: DC

## 2013-12-20 NOTE — Progress Notes (Addendum)
Symptom  Yes  No  Details   Angina        x  Ativity:   Claudication        x How far:   Syncope              x When:    Stroke        x   Orthopnea        x How many pillows:   one  PND        x How often:  CPAP    N/A  How many hrs:   Pedal edema              x   Abd fullness             x   N&V        x good appetite; eating full meals  Diaphoresis        x When:  Bleeding           x     Urine color        light yellow  SOB            x Activity:     Palpitations        x When:   ICD shock        x   Hospitlizaitons              x When/where/why:   ED visit        x When/where/why:  Other MD        x When/who/why:  Activity     Cardiac rehab referral; pt doing household chores and yard work  Fluid     No limitations 48 ozs  Diet     Low salt   Vital signs: HR: 86 MAP BP: 104 O2 Sat: 98% Wt:   147.4 lbs Last weight: 145.2 lbs Ht: 5'9"  LVAD interrogation reveals:  Speed:  9400 Flow:  4.4 Power:  5.4 PI: 7.6 Alarms:  none Events:  2 - 5 PI Fixed speed:  9400 Low speed limit:  8800  LVAD exit site:   Driveline completely healed and incorporated. The velour is fully implanted at exit site. Sorbaview dressing with biopatch intact. Dressing change using sterile technique performed by wife. Exit site remains slightly erythematous under biopatch. No drainage or foul odor noted; pt denies tenderness.  Stabilization device present and accurately applied. Driveline dressing is being changed weekly per sterile technique using sorbaview dressing. Pt denies fever or chills.  I reviewed the LVAD parameters from today, and compared the results to the patient's prior recorded data. No programming changes were made. The LVAD is functioning within specified parameters.  LVAD interrogation was negative for any significant power changes or alarms; a few PI events/speed drops present which is consistent with the patient's history.    Pt/caregiver deny any alarms or VAD equipment issues.  VAD coordinator reviewed daily log from home for daily temperature, weight, and VAD parameters. Pt is performing daily controller and system monitor self tests along with completing weekly and monthly maintenance for LVAD equipment.   LVAD equipment check completed and is in good working order. Back-up equipment present. LVAD education done on emergency procedures and precautions and reviewed exit site care.    Pt completed Intermacs 3 mos f/u including QOL, KCCQ-12, and neurocognitive trailmaking. Pt walked 900 feet during 6 minute walk with SOB during last 100 feet.

## 2013-12-20 NOTE — Patient Instructions (Signed)
1. Hold coumadin today; then take 5 mg daily. Re-check INR Monday 12/25/13. 2.  Increase iron to daily (take in the evening). 3.  Return to [place] [facility] 3 weeks

## 2013-12-20 NOTE — Progress Notes (Signed)
Patient ID: Johnny Oneill, male   DOB: [birth year], 73 y.o.   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   Admitted 3/4-3/25/15 with SOB and syncopal episode. Found to have SBO and NG placed and started on TPN. On 11/06/13 developed progressive SOB and hypotension and co-ox 51% and Hgb 6.7. Started on milrinone and transfused. ECHo showed nl RV function. Patient had PEA arrest and was intubated and started on pressors. CXR revelaed a R hemothorax and a R CT placed. He required VATs  and evacuation of hemothorax and then was transferred to CIR.   Follow up: Last visit lisinopril was increased to 10 mg in am and 5 mg in pm. He later called back and had dizziness. He was instructed to stop evening dose of  Lisinopril and that helped.  He was evaluated by Dr Dwyane Dee on 4/19 for weight loss and fatigue. Synthroid was cut back to 175 mcg daily. Denies SOB/PND/Orthopnea. Able to walk 60 yards up an incline with minimal dyspnea. Weight at home 137-141 pounds. He says when his PI is below 6 he develops mild  Dizziness. When that occurs he drinks extra fluid and it resolves. Appetite improving. Home PT completed. Cardiac rehab pending.   Labs 12/20/13 K 3.9 Creatinine 1.23 Hgb 11.5 LDH 287  Labs 12/12/13 K 4.3 Creatinine 1.3 Labs 12/06/13 K 3.9 Creatinine 1.18   Denies LVAD alarms.  Denies driveline trauma, erythema or drainage.  Denies ICD shocks.    Reports taking Coumadin as prescribed and adherence to anticoagulation based dietary restrictions.  Denies bright red blood per rectum or melena, no dark urine or hematuria.     Past Medical History  Diagnosis Date  . Ischemic cardiomyopathy     a. 10/09 Echo: Sev LV Dysfxn, inf/lat AK, Mod MR.  [name] Hypercholesteremia   . Osteoarthritis     a. s/p R TKR  . Anxiety   . Hypothyroidism   . CAD (coronary artery disease)    S/P stenting of LCX in 2004;  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 Hypertension   . PVC (premature ventricular contraction)   . Pacemaker   . CHF (congestive heart failure)   . Shortness of breath   . Automatic implantable cardioverter-defibrillator in situ     Current Outpatient Prescriptions  Medication Sig Dispense Refill  . aspirin 325 MG tablet Take 1 tablet (325 mg total) by mouth daily.      . citalopram (CELEXA) 10 MG tablet Take 1 tablet (10 mg total) by mouth daily.  30 tablet  1  . Coenzyme Q10 (COQ10) 200 MG CAPS Take 200 mg by mouth daily.      [name] ezetimibe (ZETIA) 10 MG tablet Take 10 mg by mouth every other day.       . ferrous Q000111Q C-folic acid (TRINSICON / FOLTRIN) capsule Take 1 capsule by mouth every other day.      [name] geriatric multivitamins-minerals (ELDERTONIC/GEVRABON) ELIX Take one spoonful before each meal  240 mL  2  . levothyroxine (SYNTHROID) 175 MCG tablet Take 1 tablet (175 mcg total)  by mouth daily before breakfast.  30 tablet  1  . lisinopril (PRINIVIL,ZESTRIL) 10 MG tablet Take 10 mg by mouth daily.       . nitroGLYCERIN (NITROSTAT) 0.4 MG SL tablet Place 0.4 mg under the tongue every 5 (five) minutes as needed for chest pain.       . Omega-3 300 MG CAPS Take 300 mg by mouth at bedtime.       . pantoprazole (PROTONIX) 40 MG tablet Take 1 tablet (40 mg total) by mouth daily.  30 tablet  1  . potassium chloride SA (K-DUR,KLOR-CON) 20 MEQ tablet Take 20 mEq by mouth. Take one every other day      . rosuvastatin (CRESTOR) 20 MG tablet Take 20 mg by mouth daily.      [name] spironolactone (ALDACTONE) 25 MG tablet Take 1 tablet (25 mg total) by mouth daily.  30 tablet  1  . temazepam (RESTORIL) 15 MG capsule Take 1 capsule (15 mg total) by mouth at bedtime.   30 capsule  0  . traMADol (ULTRAM) 50 MG tablet       . warfarin (COUMADIN) 7.5 MG tablet Take 7.5 mg by mouth daily at 6 PM. Takes 7.5 mg on Monday, Wednesday and Friday, and 1 tablet Tuesday, Thursday and Saturday.       No current facility-administered medications for this encounter.    Diovan and Lipitor  REVIEW OF SYSTEMS: All systems negative except as listed in HPI, PMH and Problem list.  Filed Vitals:   12/20/13 1122  BP: 104/0  Pulse: 86  Weight: 147 lb 6.4 oz (66.86 kg)  SpO2: 98%   LVAD INTERROGATION:   HeartMate II LVAD:  Speed: 9400 Flow 4.4 Power: 5.4 PI: 7.7 Alarms: None Events: 2-5 PI  Fixed Speed: 9400 Low speed: 8800     I reviewed the LVAD parameters from today, and compared the results to the patient's prior recorded data.  No programming changes were made.  The LVAD is functioning within specified parameters.  The patient performs LVAD self-test daily.  LVAD interrogation was negative for any significant power changes, alarms or PI events/speed drops.  LVAD equipment check completed and is in good working order.  Back-up equipment present.   LVAD education done on emergency procedures and precautions and reviewed exit site care.    Filed Vitals:   12/20/13 1122  BP: 104/0  Pulse: 86  Weight: 147 lb 6.4 oz (66.86 kg)  SpO2: 98%   MAP 104 Physical Exam: GENERAL: Well appearing, male who presents to clinic today in no acute distress; Wife present HEENT: normal  NECK: Supple, JVP flat; no bruits.  No lymphadenopathy or thyromegaly appreciated.   CARDIAC:  Mechanical heart sounds with LVAD hum present.  LUNGS:  Clear to auscultation bilaterally.  ABDOMEN:  Soft, round, nontender, positive bowel sounds x4.     LVAD exit site: well-healed and semi incorporated.  Dressing dry and intact.  No erythema or drainage.  Stabilization device present and accurately applied.  Driveline dressing is being changed alternate days  per sterile technique. EXTREMITIES:   Warm and dry, no cyanosis, clubbing, rash or edema  NEUROLOGIC:  Alert and oriented x 4.  Gait steady.  No aphasia.  No dysarthria.  Affect pleasant.     ASSESSMENT AND PLAN:   1) Chronic systolic HF: ICM, EF 0000000 s/p LVAD HM II implant 08/2013 - NYHA II symptoms and volume status stable. He is not on any lasix, will continue spironolactone 25  mg daily. - MAP slightly elevated, but he is intolerant up titration of lisinopril. Continue lisinopril 10 mg daily. Keep off pm lisinopril  - Not on BB with history of RV failure, if MAPs continue to be elevated and he is doing well would repeat ECHO and then assess whether could start low dose BB. Consider ECHO  next visit.  Check BMET, CBC,LDH---> K 3.9 Creatinine 1.23 Hgb 11.5 LDH 287  - Reinforced the need and importance of daily weights, a low sodium diet, and fluid restriction (less than 2 L a day). Instructed to call the HF clinic if weight increases more than 3 lbs overnight or 5 lbs in a week.  2) LVAD - Doing well. All LVAD parameters nl. Will check LDH, CBD, INR and BMET today.  - As above MAP slighlty but intolerant of up titration in ace. Appetitie improving - Currently status 7 on tx [facility] 3) Chronic anticoagulation - INR goal 2.0-3.0. No bleeding issues. Will get INR today and adjust accordingly. - Continue ASA 325 mg for LVAD 4) HTN - Continue current lisinopril and spironolactone.  Map a little elevated however he is intolerant uptitration of Ace-I.  5) Hypothyroidism Per Dr Dwyane Dee. Synthroid cut back to 175 mcg. Will need repeat TSH in 6 weeks.   Follow up in 3 weeks.  Amy D Clegg NP-C 11:30 AM

## 2013-12-21 LAB — PREALBUMIN: Prealbumin: 16.6 mg/dL — ABNORMAL LOW (ref 17.0–34.0)

## 2013-12-21 NOTE — Progress Notes (Signed)
Patient ID: Johnny Oneill, male   DOB: Dec 22, 1940, 73 y.o.   MRN: [id number] Patient ID: Johnny Oneill, male   DOB: 1941-03-14, 73 y.o.   MRN: [id number] HPI: The patient is a 73 year old nondiabetic male status post LVAD placement with HeartMate 2   09/18/2013. He returns for routine LVAD clinic visit. Since his last visit he has improved exercise and is receiving home PT. He denies any symptoms of heart failure or angina. The surgical incisions are healing well. The drive line dressing is being changed  weekly and is without drainage. He denies fever or bleeding complications. He is becoming more mobile. Is not yet driving. His weight has been stable and his appetite is slowly improving. The patient denies any GI symptoms and has stopped taking Colace because of normal bowel movements. He wishes to start driving he become more active. He hasn't been some light yard work.  Follow up: Interrogation of the system monitor shows no PI events with good flow rates and no power spikes  or pump alarms. EKG today shows sinus rhythm. His mean arterial pressure  is remaining slightly elevated and his lisinopril dose will not be adjusted because of problems with dizziness with a higher dose  His INR today is elevated greater than 3.5 and his Coumadin dose will be adjusted. He had some bleeding from a tape skin trauma.  Denies LVAD alarms.  Denies driveline trauma, erythema or drainage.  Denies ICD shocks.   Reports taking Coumadin as prescribed and adherence to anticoagulation based dietary restrictions.  Denies bright red blood per rectum or melena, no dark urine or hematuria.     Past Medical History  Diagnosis Date  . Ischemic cardiomyopathy     a. 10/09 Echo: Sev LV Dysfxn, inf/lat AK, Mod MR.  [name];  s/p Lat MI 2009 with occlusion of the LCX - treated with  Promus stenting. Has total occlusion of the RCA.   . ICD (implantable cardiac defibrillator) in place     PROPHYLACTIC      medtronic  . RBBB   . 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 Hypertension   . PVC (premature ventricular contraction)   . Pacemaker   . CHF (congestive heart failure)   . Shortness of breath   . Automatic implantable cardioverter-defibrillator in situ     Current Outpatient Prescriptions  Medication Sig Dispense Refill  . aspirin 325 MG tablet Take 1 tablet (325 mg total) by mouth daily.      . citalopram (CELEXA) 10 MG tablet Take 1 tablet (10 mg total) by mouth daily.  30 tablet  1  . Coenzyme Q10 (COQ10) 200 MG CAPS Take 200 mg by mouth daily.      [name] ezetimibe (ZETIA) 10 MG tablet Take 10 mg by mouth every other day.       . ferrous Q000111Q C-folic acid (TRINSICON / FOLTRIN) capsule Take 1 capsule by mouth daily.       [name] geriatric multivitamins-minerals (ELDERTONIC/GEVRABON) ELIX Take one spoonful before each meal  240 mL  2  . levothyroxine (SYNTHROID) 175 MCG tablet Take 1 tablet (175 mcg total) by mouth daily before breakfast.  30 tablet  1  . lisinopril (PRINIVIL,ZESTRIL) 10 MG tablet Take 10 mg by mouth daily.       [name]  nitroGLYCERIN (NITROSTAT) 0.4 MG SL tablet Place 0.4 mg under the tongue every 5 (five) minutes as needed for chest pain.       . Omega-3 300 MG CAPS Take 300 mg by mouth at bedtime.       . pantoprazole (PROTONIX) 40 MG tablet Take 1 tablet (40 mg total) by mouth daily.  30 tablet  1  . potassium chloride SA (K-DUR,KLOR-CON) 20 MEQ tablet Take 20 mEq by mouth. Take one every other day      . rosuvastatin (CRESTOR) 20 MG tablet Take 20 mg by mouth daily.      [name] spironolactone (ALDACTONE) 25 MG tablet Take 1 tablet (25 mg total) by mouth daily.  30 tablet  1  . temazepam (RESTORIL) 15 MG capsule Take 1 capsule (15 mg total) by mouth at bedtime.  30 capsule  3  . traMADol (ULTRAM) 50 MG tablet       . warfarin (COUMADIN) 7.5 MG tablet Take  7.5 mg by mouth daily at 6 PM. Take as directed for INR 2.0 - 3.0       No current facility-administered medications for this encounter.    Diovan and Lipitor  REVIEW OF SYSTEMS: All systems negative except as listed in HPI, PMH and Problem list.   LVAD INTERROGATION:   HeartMate II LVAD:  Flow 4.4 liters/min, speed 9400 RPM, power 5.4, PI 7.5, no alarms and occasional PI events on a typical day  I reviewed the LVAD parameters from today, and compared the results to the patient's prior recorded data.  No programming changes were made.  The LVAD is functioning within specified parameters.  The patient performs LVAD self-test daily.  LVAD interrogation was negative for any significant power changes, alarms or PI events/speed drops.  LVAD equipment check completed and is in good working order.  Back-up equipment present.   LVAD education done on emergency procedures and precautions and reviewed exit site care.    Pulse 85 weight 146, O2 saturation 99%, mean arterial pressure 104 mmHg  Physical Exam: GENERAL: Well appearing, male who presents to clinic today in no acute distress. HEENT: normal  NECK: Supple, JVP normal ., no bruits.  No lymphadenopathy or thyromegaly appreciated.   CARDIAC:  Mechanical heart sounds with LVAD hum present.  LUNGS:  Clear to auscultation bilaterally.  ABDOMEN:  Soft, round, nontender, positive bowel sounds x4.     LVAD exit site: well-healed and incorporated.  Dressing dry and intact.  No erythema or drainage.  Stabilization device present and accurately applied.  Driveline dressing is being changed  once per week per sterile technique. All the previous eschar has now been removed from drive line site. There is some residual redness. This is better though.  EXTREMITIES:  Warm and dry, no cyanosis, clubbing, rash or edema  NEUROLOGIC:  Alert and oriented x 4.  Gait steady.  No aphasia.  No dysarthria.  Affect pleasant.     EKG: Normal sinus  rhythm  ASSESSMENT AND PLAN:   1. Status post LVAD implantation - Patient comes to clinic today for routine follow up visit. Currently has NYHA class 2 symptoms on LVAD support. Plan to follow up at next routine visit.      2.  Anticoagulation management - INR goal 2.0-3.0.  Will evaluate INR value today and follow up with patient for necessary changes. His Coumadin will be adjusted for a elevated INR   3 the patient was instructed he can resume local driving during daytime  for short distances and can lift light weights  4 he'll return in 3 weeks for blood pressure and wound check and INR check. Consider echocardiogram to evaluate RV function.His LV function is very poor so I would doubt that his aortic valve will open even at low speed RPM's during a ramp echo  5. The patient will contact VAD team immediately for any recurrent symptoms of partial bowel obstruction

## 2013-12-21 NOTE — Addendum Note (Signed)
Encounter addended by: Ivin Poot, MD on: 12/21/2013  4:02 PM<BR>     Documentation filed: Clinical Notes, Problem List

## 2013-12-25 ENCOUNTER — Ambulatory Visit (HOSPITAL_COMMUNITY): Payer: Self-pay | Admitting: *Deleted

## 2013-12-25 LAB — PROTIME-INR: INR: 3.7 — AB (ref <=1.1)

## 2013-12-27 ENCOUNTER — Ambulatory Visit (HOSPITAL_COMMUNITY)
Admission: RE | Admit: 2013-12-27 | Discharge: 2013-12-27 | Disposition: A | Discharge status: Home / Self Care | Payer: Medicare Other | Source: Ambulatory Visit | Attending: Internal Medicine | Admitting: Internal Medicine

## 2013-12-27 VITALS — BP 98/0 | HR 82 | Ht 69.0 in | Wt 150.2 lb

## 2013-12-27 DIAGNOSIS — I5022 Chronic systolic (congestive) heart failure: Secondary | ICD-10-CM

## 2013-12-27 DIAGNOSIS — Z95811 Presence of heart assist device: Secondary | ICD-10-CM

## 2013-12-28 NOTE — Progress Notes (Signed)
Wife called VAD coordinator stating pt had been out visiting relatives and had several episodes of dizziness with PI drops in the 4 - 5 range. She had pt drink water. After lunch, pt was getting out of car and "fell back into the seat" from dizziness. Pt did not pass out, no N&V, but PI dropped in the 3 - 4 range and is "not picking back up".  Directed wife to bring pt to VAD clinic for BP check and VAD parameter review.  Pt arrived, walking without assistance. States he feels better now, brief episodes of dizziness with orthostatic changes. Asked pt to stand, bend, bear down with noted PI drops into the 3 - 4 range and pt was dizzy.  Pt states he is drinking @ 1 liter fluid/daily.  LVAD interrogation reveals:  Speed: 9400  Flow: 4.1 Power: 5.1 PI: 6.3 Alarms: none  Events: 2 - 10 PI  Fixed speed: 9400  Low speed limit: 8800    Vital signs:  HR: 82 MAP BP: 98 O2 Sat: 100  Wt: 150 lbs  Last weight: 145.2 lbs  Ht: 5'9"   Instructed pt to increase fluid intake to 1.5 - 2 liters/day and LVAD speed decreased to 9200 RPM per Junie Bame, NP.   Orthostatic VS and VAD parameters: MAP BP: HR:  Flow:  Speed: PI:  Power:       Lying:   112  81  4.2  9200  7.0  5.0     Sitting:  104  81  4.3  9200  6.5  5.1     Standing:  100  83  4.2  9200  7.0  4.9  Less drop in PI and pt's symptoms improved with lower fixed speed.  Primary and back up controller programmed to fixed speed 9200 and low speed limit 8600 RPM.    Next VAD clinic appt rescheduled for next week along with Ramp echo. Instructed pt/wife to call if symptoms do not improve or worsen. Both verbalized understanding of same.

## 2014-01-01 ENCOUNTER — Ambulatory Visit (HOSPITAL_COMMUNITY): Payer: Self-pay | Admitting: *Deleted

## 2014-01-01 LAB — PROTIME-INR: INR: 2.7 — AB (ref 0.9–1.1)

## 2014-01-04 ENCOUNTER — Other Ambulatory Visit: Payer: Self-pay | Admitting: *Deleted

## 2014-01-04 DIAGNOSIS — I509 Heart failure, unspecified: Secondary | ICD-10-CM

## 2014-01-04 DIAGNOSIS — Z95811 Presence of heart assist device: Secondary | ICD-10-CM

## 2014-01-04 DIAGNOSIS — Z7901 Long term (current) use of anticoagulants: Secondary | ICD-10-CM

## 2014-01-07 ENCOUNTER — Telehealth (HOSPITAL_COMMUNITY): Payer: Self-pay | Admitting: *Deleted

## 2014-01-08 ENCOUNTER — Ambulatory Visit (HOSPITAL_BASED_OUTPATIENT_CLINIC_OR_DEPARTMENT_OTHER)
Admission: RE | Admit: 2014-01-08 | Discharge: 2014-01-08 | Disposition: A | Discharge status: Home / Self Care | Payer: Medicare Other | Source: Ambulatory Visit | Attending: Internal Medicine | Admitting: Internal Medicine

## 2014-01-08 ENCOUNTER — Ambulatory Visit (HOSPITAL_COMMUNITY)
Admission: RE | Admit: 2014-01-08 | Discharge: 2014-01-08 | Disposition: A | Discharge status: Home / Self Care | Payer: Medicare Other | Source: Ambulatory Visit | Attending: Internal Medicine | Admitting: Internal Medicine

## 2014-01-08 ENCOUNTER — Ambulatory Visit (HOSPITAL_COMMUNITY): Payer: Self-pay | Admitting: *Deleted

## 2014-01-08 ENCOUNTER — Other Ambulatory Visit (HOSPITAL_COMMUNITY): Payer: Self-pay | Admitting: *Deleted

## 2014-01-08 ENCOUNTER — Encounter (HOSPITAL_COMMUNITY): Payer: Medicare Other

## 2014-01-08 ENCOUNTER — Ambulatory Visit (HOSPITAL_COMMUNITY): Payer: Medicare Other

## 2014-01-08 ENCOUNTER — Telehealth (HOSPITAL_COMMUNITY): Payer: Self-pay | Admitting: *Deleted

## 2014-01-08 VITALS — Ht 69.0 in | Wt 146.4 lb

## 2014-01-08 DIAGNOSIS — E039 Hypothyroidism, unspecified: Secondary | ICD-10-CM

## 2014-01-08 DIAGNOSIS — E785 Hyperlipidemia, unspecified: Secondary | ICD-10-CM | POA: Insufficient documentation

## 2014-01-08 DIAGNOSIS — I252 Old myocardial infarction: Secondary | ICD-10-CM | POA: Insufficient documentation

## 2014-01-08 DIAGNOSIS — Z95811 Presence of heart assist device: Secondary | ICD-10-CM

## 2014-01-08 DIAGNOSIS — I2510 Atherosclerotic heart disease of native coronary artery without angina pectoris: Secondary | ICD-10-CM

## 2014-01-08 DIAGNOSIS — I255 Ischemic cardiomyopathy: Secondary | ICD-10-CM

## 2014-01-08 DIAGNOSIS — I4949 Other premature depolarization: Secondary | ICD-10-CM | POA: Insufficient documentation

## 2014-01-08 DIAGNOSIS — Z7901 Long term (current) use of anticoagulants: Secondary | ICD-10-CM | POA: Insufficient documentation

## 2014-01-08 DIAGNOSIS — I509 Heart failure, unspecified: Secondary | ICD-10-CM

## 2014-01-08 DIAGNOSIS — I359 Nonrheumatic aortic valve disorder, unspecified: Secondary | ICD-10-CM

## 2014-01-08 DIAGNOSIS — I059 Rheumatic mitral valve disease, unspecified: Secondary | ICD-10-CM | POA: Insufficient documentation

## 2014-01-08 DIAGNOSIS — I10 Essential (primary) hypertension: Secondary | ICD-10-CM

## 2014-01-08 DIAGNOSIS — N189 Chronic kidney disease, unspecified: Secondary | ICD-10-CM

## 2014-01-08 DIAGNOSIS — I498 Other specified cardiac arrhythmias: Secondary | ICD-10-CM | POA: Insufficient documentation

## 2014-01-08 DIAGNOSIS — R55 Syncope and collapse: Secondary | ICD-10-CM | POA: Insufficient documentation

## 2014-01-08 DIAGNOSIS — I2589 Other forms of chronic ischemic heart disease: Secondary | ICD-10-CM

## 2014-01-08 DIAGNOSIS — I5022 Chronic systolic (congestive) heart failure: Secondary | ICD-10-CM

## 2014-01-08 LAB — PROTIME-INR
INR: 1.69 — ABNORMAL HIGH (ref 0.00–1.49)
Prothrombin Time: 19.4 seconds — ABNORMAL HIGH (ref 11.6–15.2)

## 2014-01-08 LAB — CBC
HCT: 38.6 % — ABNORMAL LOW (ref 39.0–52.0)
Hemoglobin: 12 g/dL — ABNORMAL LOW (ref 13.0–17.0)
MCH: 27.3 pg (ref 26.0–34.0)
MCHC: 31.1 g/dL (ref 30.0–36.0)
MCV: 87.9 fL (ref 78.0–100.0)
Platelets: 195 10*3/uL (ref 150–400)
RBC: 4.39 MIL/uL (ref 4.22–5.81)
RDW: 16.8 % — ABNORMAL HIGH (ref 11.5–15.5)
WBC: 6.6 10*3/uL (ref 4.0–10.5)

## 2014-01-08 LAB — BASIC METABOLIC PANEL
BUN: 24 mg/dL — ABNORMAL HIGH (ref 6–23)
CO2: 25 mEq/L (ref 19–32)
Calcium: 9.2 mg/dL (ref 8.4–10.5)
Chloride: 102 mEq/L (ref 96–112)
Creatinine, Ser: 1.43 mg/dL — ABNORMAL HIGH (ref 0.50–1.35)
GFR calc Af Amer: 56 mL/min — ABNORMAL LOW (ref >90)
GFR calc non Af Amer: 48 mL/min — ABNORMAL LOW (ref >90)
Glucose, Bld: 88 mg/dL (ref 70–99)
Potassium: 5.7 mEq/L — ABNORMAL HIGH (ref 3.7–5.3)
Sodium: 138 mEq/L (ref 137–147)

## 2014-01-08 LAB — LACTATE DEHYDROGENASE: LDH: 220 U/L (ref 94–250)

## 2014-01-08 LAB — PRO B NATRIURETIC PEPTIDE: Pro B Natriuretic peptide (BNP): 625.4 pg/mL — ABNORMAL HIGH (ref 0–125)

## 2014-01-08 MED ORDER — LISINOPRIL 5 MG PO TABS: 5.0000 mg | ORAL_TABLET | Freq: Every day | ORAL | Status: DC

## 2014-01-08 NOTE — Patient Instructions (Addendum)
1.  Decrease Lisinopril to 5 mg daily 2.  Nurse visit next 01/16/14 3.  Will call you with INR and coumadin instructions 4.  May start taking showers

## 2014-01-08 NOTE — Progress Notes (Signed)
  Echocardiogram 2D Echocardiogram has been performed.  Johnny Oneill 01/08/2014, 1:51 PM

## 2014-01-08 NOTE — Progress Notes (Signed)
Symptom  Yes  No  Details   Angina        x  Ativity:   Claudication        x How far:   Syncope              x When:    Stroke        x   Orthopnea        x How many pillows:   one  PND        x How often:  CPAP    N/A  How many hrs:   Pedal edema              x   Abd fullness             x   N&V        x good appetite; eating full meals  Diaphoresis        x When:  Bleeding           x     Urine color        light yellow  SOB            x Activity:     Palpitations        x When:   ICD shock        x   Hospitlizaitons              x When/where/why:   ED visit        x When/where/why:  Other MD        x When/who/why:  Activity     Cardiac rehab referral; pt doing household chores and yard work  Fluid     No limitations 48 ozs  Diet     Low salt   Vital signs: HR: 85 MAP BP:  Right arm: 80, 76 lying Left arm sitting:  92 O2 Sat:  99 Wt:   146.4 lbs Last weight: 150.2 lbs Ht: 5'9"  LVAD interrogation reveals:  Speed:  9000 Flow:  4.1 Power: 4.8   PI: 6.5 Alarms:  none Events:  0 - 5 PI events Fixed speed:  9000  RPM Low speed limit:  8400 RPM  LVAD exit site:   Driveline completely healed and incorporated. The velour is fully implanted at exit site. Sorbaview dressing with biopatch intact. Dressing change using sterile technique performed by wife. No redness, drainage, tenderness, or foul odor noted.  Stabilization device present and accurately applied. Driveline dressing is being changed weekly per sterile technique using sorbaview dressing. Pt denies fever or chills.   Pt may begin taking showers. Reviewed appropriate steps for taking showers including using shower bag, keeping equipment dry, drive line site protections, changing VAD dressing if any moisture under dressing. Advised to have caregiver nearby and to hydrate well prior to first few showers. Call VAD coordinator if any changes in exit site. Patient and wife verbalized understanding of same.  I reviewed the  LVAD parameters from today, and compared the results to the patient's prior recorded data. Based on ramp echo, primary and back up controller reprogrammed to fixed speed 9000 RPM and low speed limit 8400 RPM. The LVAD is functioning within specified parameters.  LVAD interrogation was negative for any significant power changes or alarms; a few PI events/speed drops present which is consistent with the patient's history.    Pt/caregiver deny any alarms or VAD equipment issues. VAD coordinator reviewed daily log from home for  daily temperature, weight, and VAD parameters. Pt is performing daily controller and system monitor self tests along with completing weekly and monthly maintenance for LVAD equipment.   LVAD equipment check completed and is in good working order. Back-up equipment present. LVAD education done on emergency procedures and precautions and reviewed exit site care.   MAP Speed  Flow  PI  Power  LVIDD  AI  Aortic openings  MR  TR  Septum  RV   76 9200  4.6 6.8 5.2 4.9 trivial 0/0  none  none midline normal  80 9000 4.3 7.1 5.0 5.37  trivial 0/0 none none Midline to slight right normal                                                             Ramp ECHO performed in echo lab per Dr. Aundra Dubin.  At completion of ramp study, patients primary and back up controller programmed:   Fixed speed: 9000 RPM Low speed limit: 8400 RPM

## 2014-01-08 NOTE — Telephone Encounter (Signed)
Wife called VAD pager to report pt still having intermittent episodes of dizziness and lightheadedness with orthostatic changes. Reports he stood today and became very lightheaded and attempted to sit down on couch. Missed couch and fell, scraping his arm. Pt doesn't know if he passed out or not. Pt denies CP, palpitations, ICD shock, or heart failure symptoms. He is drinking more fluid as instructed, but wife not sure he is up to 2 liters/day.   Dr. Haroldine Laws updated - plan for ramp echo and clinic visit tomorrow. Instructed wife to call if symptoms worsen or do not improve. States pt has been having "more good days" since nurse visit last week. States pt feels fine now and wants to go to work; encouraged him to stay home, rest, and continue to drink more fluids. Wife agrees with plan.

## 2014-01-08 NOTE — Progress Notes (Signed)
Patient ID: Johnny Oneill, male   DOB: 1941-08-25, 73 y.o.   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   Admitted 3/4-3/25/15 with SOB and syncopal episode. Found to have SBO and NG placed and started on TPN. On 11/06/13 developed progressive SOB and hypotension and co-ox 51% and Hgb 6.7. Started on milrinone and transfused. ECHo showed nl RV function. Patient had PEA arrest and was intubated and started on pressors. CXR revelaed a R hemothorax and a R CT placed. He required VATs  and evacuation of hemothorax and then tansferred to CIR.   He retrusn for follow up. Yesterday he fell at home with presyncope noted and PI in the 3s. Resolved after he drank fluids.  Weight at home 141-142 pounds. Denies SOB/PND/Orthopnea. Has follow up with Dr Dwyane Dee next week. Last week he was very active cleaning the church. HHRN will finish next week. Appetite improving. Cardiac rehab pending. Complaint with meds. He has been intolerant with up titration of ace.   Labs 12/20/13 K 3.9 Creatinine 1.23 Hgb 11.5 LDH 287  Labs 12/12/13 K 4.3 Creatinine 1.3 Labs 12/06/13 K 3.9 Creatinine 1.18   Denies LVAD alarms.  Denies driveline trauma, erythema or drainage.  Denies ICD shocks.    Reports taking Coumadin as prescribed and adherence to anticoagulation based dietary restrictions.  Denies bright red blood per rectum or melena, no dark urine or hematuria.     Past Medical History  Diagnosis Date  . Ischemic cardiomyopathy     a. 10/09 Echo: Sev LV Dysfxn, inf/lat AK, Mod MR.  [name];  s/p Lat MI 2009 with occlusion of the LCX - treated with Promus stenting. Has total occlusion of the RCA.   . ICD (implantable cardiac defibrillator) in  place     PROPHYLACTIC      medtronic  . RBBB   . 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 Hypertension   . PVC (premature ventricular contraction)   . Pacemaker   . CHF (congestive heart failure)   . Shortness of breath   . Automatic implantable cardioverter-defibrillator in situ     Current Outpatient Prescriptions  Medication Sig Dispense Refill  . aspirin 325 MG tablet Take 1 tablet (325 mg total) by mouth daily.      . citalopram (CELEXA) 10 MG tablet Take 1 tablet (10 mg total) by mouth daily.  30 tablet  1  . Coenzyme Q10 (COQ10) 200 MG CAPS Take 200 mg by mouth daily.      [name] ezetimibe (ZETIA) 10 MG tablet Take 10 mg by mouth every other day.       . ferrous Q000111Q C-folic acid (TRINSICON / FOLTRIN) capsule Take 1 capsule by mouth daily.       [name] geriatric multivitamins-minerals (ELDERTONIC/GEVRABON) ELIX Take one spoonful before each meal  240 mL  2  . levothyroxine (SYNTHROID) 175 MCG tablet Take 1 tablet (175 mcg total) by mouth daily before breakfast.  30 tablet  1  . lisinopril (PRINIVIL,ZESTRIL) 10 MG tablet Take 10 mg by mouth daily.       . Omega-3 300 MG CAPS Take 300 mg by  mouth at bedtime.       . pantoprazole (PROTONIX) 40 MG tablet Take 1 tablet (40 mg total) by mouth daily.  30 tablet  1  . potassium chloride SA (K-DUR,KLOR-CON) 20 MEQ tablet Take 20 mEq by mouth. Take one every other day      . rosuvastatin (CRESTOR) 20 MG tablet Take 20 mg by mouth daily.      [name] spironolactone (ALDACTONE) 25 MG tablet Take 1 tablet (25 mg total) by mouth daily.  30 tablet  1  . temazepam (RESTORIL) 15 MG capsule Take 1 capsule (15 mg total) by mouth at bedtime.  30 capsule  3  . warfarin (COUMADIN) 5 MG tablet Take by mouth. Take as directed for INR 2.0 - 3.0       No current facility-administered medications for this encounter.    Diovan and Lipitor  REVIEW OF SYSTEMS: All systems negative except as listed in HPI, PMH and Problem list.  Filed Vitals:   01/08/14 1353   Height: 5\' 9"  (1.753 m)  Weight: 146 lb 6.4 oz (66.407 kg)   LVAD INTERROGATION:   HeartMate II LVAD:  Speed: 9000 Flow 4.0 Power: 4.7  PI: 6.4 Alarms: None Events: 2-5 PI  Fixed Speed: 9000 Low speed: 8600     I reviewed the LVAD parameters from today, and compared the results to the patient's prior recorded data.  No programming changes were made.  The LVAD is functioning within specified parameters.  The patient performs LVAD self-test daily.  LVAD interrogation was negative for any significant power changes, alarms or PI events/speed drops.  LVAD equipment check completed and is in good working order.  Back-up equipment present.   LVAD education done on emergency procedures and precautions and reviewed exit site care.    Filed Vitals:   01/08/14 1353  Height: 5\' 9"  (1.753 m)  Weight: 146 lb 6.4 oz (66.407 kg)   MAP 104 Physical Exam: GENERAL: Well appearing, male who presents to clinic today in no acute distress; Wife present HEENT: normal  NECK: Supple, JVP flat; no bruits.  No lymphadenopathy or thyromegaly appreciated.   CARDIAC:  Mechanical heart sounds with LVAD hum present.  LUNGS:  Clear to auscultation bilaterally.  ABDOMEN:  Soft, round, nontender, positive bowel sounds x4.     LVAD exit site: well-healed and semi incorporated.  Dressing dry and intact.  No erythema or drainage.  Stabilization device present and accurately applied.  Driveline dressing is being changed alternate days  per sterile technique. EXTREMITIES:  Warm and dry, no cyanosis, clubbing, rash or edema  NEUROLOGIC:  Alert and oriented x 4.  Gait steady.  No aphasia.  No dysarthria.  Affect pleasant.     ASSESSMENT AND PLAN:   1) Chronic systolic HF: ICM, EF 0000000 s/p LVAD HM II implant 08/2013. Had RAMP ECHO today with Dr Aundra Dubin and he cut back the speed to 9000 to LV to fill.   - NYHA II symptoms and volume status stable. He is not on lasix, will continue spironolactone 25 mg daily.  - MAP  down. Cut back lisinopril to 5 mg daily. He has been intolerant of up titration of Ace. If MAP goes back up will need to consider amlodipine.  - Not on BB with history of RV failure Check BMET, CBC,LDH- - Reinforced the need and importance of daily weights, a low sodium diet, and fluid restriction (less than 2 L a day). Instructed to call the HF clinic if weight increases  more than 3 lbs overnight or 5 lbs in a week.  2) LVAD - Doing well. All LVAD parameters nl. Will check LDH, CBD, INR and BMET today.  - As above MAP down. Appetitie improving - Currently status 7 on tx list at [facility] 3) Chronic anticoagulation - INR goal 2.0-3.0. No bleeding issues. Will get INR today and adjust accordingly. - Continue ASA 325 mg for LVAD 4) HTN - MAP down will cut back lisinopril to 5 mg daily. Continue spironolactone.  [name]  5) Hypothyroidism Per Dr Dwyane Dee. Synthroid cut back to 175 mcg. Will need repeat TSH in 6 weeks.   Follow up in 1 week VAD coordinator. o reassess VAD parameters and MAP Johnny Oneill Johnny Jaianna Nicoll NP-C 2:05 PM

## 2014-01-09 NOTE — Telephone Encounter (Signed)
Called pt's wife per Dr. Haroldine Laws - instructed her to stop K-Dur, Lisinopril, and Aldactone based on labs from clinic today (elevated K+ and creat).  Pt will come Thursday afternoon for BP and lab check. Wife verbalized understanding of same.

## 2014-01-11 ENCOUNTER — Encounter (HOSPITAL_COMMUNITY): Payer: Self-pay

## 2014-01-11 ENCOUNTER — Ambulatory Visit (HOSPITAL_COMMUNITY)
Admission: RE | Admit: 2014-01-11 | Discharge: 2014-01-11 | Disposition: A | Discharge status: Home / Self Care | Payer: Medicare Other | Source: Ambulatory Visit | Attending: Internal Medicine | Admitting: Internal Medicine

## 2014-01-11 ENCOUNTER — Ambulatory Visit (HOSPITAL_COMMUNITY): Payer: Self-pay | Admitting: Anesthesiology

## 2014-01-11 ENCOUNTER — Telehealth (HOSPITAL_COMMUNITY): Payer: Self-pay | Admitting: Anesthesiology

## 2014-01-11 DIAGNOSIS — Z95818 Presence of other cardiac implants and grafts: Secondary | ICD-10-CM | POA: Insufficient documentation

## 2014-01-11 DIAGNOSIS — Z7901 Long term (current) use of anticoagulants: Secondary | ICD-10-CM | POA: Insufficient documentation

## 2014-01-11 DIAGNOSIS — I5022 Chronic systolic (congestive) heart failure: Secondary | ICD-10-CM

## 2014-01-11 DIAGNOSIS — N189 Chronic kidney disease, unspecified: Secondary | ICD-10-CM | POA: Insufficient documentation

## 2014-01-11 DIAGNOSIS — Z95811 Presence of heart assist device: Secondary | ICD-10-CM

## 2014-01-11 LAB — BASIC METABOLIC PANEL
BUN: 25 mg/dL — ABNORMAL HIGH (ref 6–23)
CO2: 24 mEq/L (ref 19–32)
Calcium: 8.8 mg/dL (ref 8.4–10.5)
Chloride: 104 mEq/L (ref 96–112)
Creatinine, Ser: 1.25 mg/dL (ref 0.50–1.35)
GFR calc Af Amer: 66 mL/min — ABNORMAL LOW (ref >90)
GFR calc non Af Amer: 57 mL/min — ABNORMAL LOW (ref >90)
Glucose, Bld: 115 mg/dL — ABNORMAL HIGH (ref 70–99)
Potassium: 4.2 mEq/L (ref 3.7–5.3)
Sodium: 135 mEq/L — ABNORMAL LOW (ref 137–147)

## 2014-01-11 LAB — PROTIME-INR
INR: 2.24 — ABNORMAL HIGH (ref 0.00–1.49)
Prothrombin Time: 24.1 seconds — ABNORMAL HIGH (ref 11.6–15.2)

## 2014-01-11 NOTE — Telephone Encounter (Signed)
Kidney function stable, called patient to let him know.  Rande Brunt NP-C 4:05 PM

## 2014-01-11 NOTE — Patient Instructions (Signed)
Pt aware.

## 2014-01-16 ENCOUNTER — Encounter (HOSPITAL_COMMUNITY): Payer: Medicare Other

## 2014-01-16 ENCOUNTER — Ambulatory Visit (HOSPITAL_COMMUNITY): Payer: Self-pay | Admitting: *Deleted

## 2014-01-16 ENCOUNTER — Other Ambulatory Visit (HOSPITAL_COMMUNITY): Payer: Self-pay | Admitting: *Deleted

## 2014-01-16 DIAGNOSIS — Z95811 Presence of heart assist device: Secondary | ICD-10-CM

## 2014-01-16 DIAGNOSIS — Z7901 Long term (current) use of anticoagulants: Secondary | ICD-10-CM

## 2014-01-16 MED ORDER — CITALOPRAM HYDROBROMIDE 10 MG PO TABS: 10.0000 mg | ORAL_TABLET | Freq: Every day | ORAL | Status: DC

## 2014-01-17 ENCOUNTER — Encounter (HOSPITAL_COMMUNITY): Payer: Medicare Other

## 2014-01-17 ENCOUNTER — Other Ambulatory Visit (HOSPITAL_COMMUNITY): Payer: Self-pay | Admitting: *Deleted

## 2014-01-17 DIAGNOSIS — Z95811 Presence of heart assist device: Secondary | ICD-10-CM

## 2014-01-17 DIAGNOSIS — Z7901 Long term (current) use of anticoagulants: Secondary | ICD-10-CM

## 2014-01-23 ENCOUNTER — Other Ambulatory Visit: Payer: Self-pay

## 2014-01-24 ENCOUNTER — Ambulatory Visit (INDEPENDENT_AMBULATORY_CARE_PROVIDER_SITE_OTHER): Payer: Medicare Other | Admitting: Internal Medicine

## 2014-01-24 ENCOUNTER — Encounter: Payer: Self-pay | Admitting: Internal Medicine

## 2014-01-24 ENCOUNTER — Ambulatory Visit (HOSPITAL_COMMUNITY): Payer: Self-pay | Admitting: *Deleted

## 2014-01-24 ENCOUNTER — Ambulatory Visit (INDEPENDENT_AMBULATORY_CARE_PROVIDER_SITE_OTHER): Payer: Medicare Other | Admitting: *Deleted

## 2014-01-24 VITALS — BP 114/0 | HR 81 | Ht 69.0 in | Wt 150.0 lb

## 2014-01-24 DIAGNOSIS — Z7901 Long term (current) use of anticoagulants: Secondary | ICD-10-CM

## 2014-01-24 DIAGNOSIS — I5022 Chronic systolic (congestive) heart failure: Secondary | ICD-10-CM

## 2014-01-24 DIAGNOSIS — Z95811 Presence of heart assist device: Secondary | ICD-10-CM

## 2014-01-24 DIAGNOSIS — I2589 Other forms of chronic ischemic heart disease: Secondary | ICD-10-CM

## 2014-01-24 LAB — MDC_IDC_ENUM_SESS_TYPE_INCLINIC
Battery Voltage: 2.86 V
Brady Statistic AP VP Percent: 1.05 %
Brady Statistic AP VS Percent: 0.59 %
Brady Statistic AS VP Percent: 0 %
Brady Statistic AS VS Percent: 98.36 %
Brady Statistic RA Percent Paced: 1.64 %
Brady Statistic RV Percent Paced: 1.05 %
Date Time Interrogation Session: 20150527191743
HighPow Impedance: 32 Ohm
HighPow Impedance: 38 Ohm
Lead Channel Impedance Value: 328 Ohm
Lead Channel Impedance Value: 344 Ohm
Lead Channel Pacing Threshold Amplitude: 0.5 V
Lead Channel Pacing Threshold Amplitude: 1.5 V
Lead Channel Pacing Threshold Pulse Width: 0.4 ms
Lead Channel Pacing Threshold Pulse Width: 1.5 ms
Lead Channel Sensing Intrinsic Amplitude: 2.262
Lead Channel Sensing Intrinsic Amplitude: 2.6624
Lead Channel Setting Pacing Amplitude: 2 V
Lead Channel Setting Pacing Amplitude: 3 V
Lead Channel Setting Pacing Pulse Width: 1.5 ms
Lead Channel Setting Sensing Sensitivity: 0.3 mV
Zone Setting Detection Interval: 300 ms
Zone Setting Detection Interval: 350 ms
Zone Setting Detection Interval: 350 ms
Zone Setting Detection Interval: 350 ms

## 2014-01-24 LAB — PROTIME-INR
INR: 3.6 ratio — ABNORMAL HIGH (ref 0.8–1.0)
Prothrombin Time: 38.9 s — ABNORMAL HIGH (ref 9.6–13.1)

## 2014-01-24 NOTE — Progress Notes (Signed)
PCP: Irven Shelling, MD Primary Cardiologist:  Dr Doreatha Lew is a 73 y.o. male who presents today for routine electrophysiology followup.  Since last being seen in our clinic, the patient has received VAD.  He feels better with his VAD in place. Today, he denies symptoms of palpitations, chest pain, shortness of breath,  lower extremity edema, dizziness, presyncope, syncope, or ICD shocks.  The patient is otherwise without complaint today.   Past Medical History  Diagnosis Date  . Ischemic cardiomyopathy     a. 10/09 Echo: Sev LV Dysfxn, inf/lat AK, Mod MR.  [name];  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 Hypertension   . PVC (premature ventricular contraction)   . Pacemaker   . CHF (congestive heart failure)   . Shortness of breath   . Automatic implantable cardioverter-defibrillator in situ    Past Surgical History  Procedure Laterality Date  . Defibrillator  2009  . Total knee arthroplasty  11/27/11    left  . Insert / replace / remove pacemaker  2009  . Coronary angioplasty with stent placement  2004  . Coronary angioplasty with stent placement  07/2008  . Knee arthroscopy w/ partial medial meniscectomy  12/2002    left  . Knee arthroscopy      bilaterally  . Total knee arthroplasty  11/27/2011    Procedure: TOTAL KNEE ARTHROPLASTY;  Surgeon: Yvette Rack., MD;  Location: [place];  Service: Orthopedics;  Laterality: Left;  left total knee arthroplasty  . Esophagogastroduodenoscopy N/A 09/15/2013    Procedure: ESOPHAGOGASTRODUODENOSCOPY (EGD);  Surgeon: Ladene Artist, MD;  Location: [facility] ENDOSCOPY;  Service: Endoscopy;   Laterality: N/A;  bedside  . Insertion of implantable left ventricular assist device N/A 09/18/2013    Procedure: INSERTION OF IMPLANTABLE LEFT VENTRICULAR ASSIST DEVICE;  Surgeon: Ivin Poot, MD;  Location: [place];  Service: Open Heart Surgery;  Laterality: N/A;  CIRC ARREST  NITRIC OXIDE  . Intraoperative transesophageal echocardiogram N/A 09/18/2013    Procedure: INTRAOPERATIVE TRANSESOPHAGEAL ECHOCARDIOGRAM;  Surgeon: Ivin Poot, MD;  Location: [place];  Service: Open Heart Surgery;  Laterality: N/A;  . Video assisted thoracoscopy Right 11/06/2013    Procedure: VIDEO ASSISTED THORACOSCOPY;  Surgeon: Gaye Pollack, MD;  Location: [place];  Service: Cardiothoracic;  Laterality: Right;  . Hematoma evacuation Right 11/06/2013    Procedure: EVACUATION HEMATOMA;  Surgeon: Gaye Pollack, MD;  Location: [facility] OR;  Service: Cardiothoracic;  Laterality: Right;    Current Outpatient Prescriptions  Medication Sig Dispense Refill  . aspirin 325 MG tablet Take 1 tablet (325 mg total) by mouth daily.      . citalopram (CELEXA) 10 MG tablet Take 1 tablet (10 mg total) by mouth daily.  30 tablet  6  . Coenzyme Q10 (COQ10) 200 MG CAPS Take 200 mg by mouth daily.      [name] ezetimibe (ZETIA) 10 MG tablet Take 10 mg by mouth daily.       . ferrous Q000111Q C-folic  acid (TRINSICON / FOLTRIN) capsule Take 1 capsule by mouth daily.       [name] geriatric multivitamins-minerals (ELDERTONIC/GEVRABON) ELIX Take one spoonful before each meal as needed      . levothyroxine (SYNTHROID) 175 MCG tablet Take 1 tablet (175 mcg total) by mouth daily before breakfast.  30 tablet  1  . Omega-3 300 MG CAPS Take 300 mg by mouth at bedtime.       . pantoprazole (PROTONIX) 40 MG tablet Take 1 tablet (40 mg total) by mouth daily.  30 tablet  1  . rosuvastatin (CRESTOR) 20 MG tablet Take 20 mg by mouth daily.      . temazepam (RESTORIL) 15 MG capsule Take 1 capsule (15 mg total) by mouth at bedtime.  30 capsule  3  . warfarin  (COUMADIN) 5 MG tablet Take as directed for INR 2.0 - 3.0 (Take 5 mg daily except take 7.5 mg on Wednesday 01/24/14)       No current facility-administered medications for this visit.    Physical Exam: Filed Vitals:   01/24/14 1428  BP: 114/0  Pulse: 81  Height: 5\' 9"  (1.753 m)  Weight: 150 lb (68.04 kg)    GEN- The patient is well appearing, alert and oriented x 3 today.   Head- normocephalic, atraumatic Eyes-  Sclera clear, conjunctiva pink Ears- hearing intact Oropharynx- clear Lungs- Clear to ausculation bilaterally, normal work of breathing Chest- ICD pocket is well healed Heart- Regular rate and rhythm, VAD hum GI- soft, NT, ND, + BS Extremities- no clubbing, cyanosis, or edema  ICD interrogation- reviewed in detail today,  See PACEART report  Assessment and Plan:  1.  Chronic systolic dysfunction euvolemic today Stable on an appropriate medical regimen optivol index is increased but has not yet crossed threshold.  I have encouraged sodium restriction Normal ICD function.  R waves have decreased since LVAD placement.  I have reviewed CXR today which reveals stable lead position.  We will follow clinically and try to avoid lead revision if possible. See Claudia Desanctis Art report No changes today

## 2014-01-24 NOTE — Patient Instructions (Signed)
Your physician wants you to follow-up in: 12 months with Dr Vallery Ridge will receive a reminder letter in the mail two months in advance. If you don't receive a letter, please call our office to schedule the follow-up appointment.  Remote monitoring is used to monitor your Pacemaker or ICD from home. This monitoring reduces the number of office visits required to check your device to one time per year. It allows [place] to keep an eye on the functioning of your device to ensure it is working properly. You are scheduled for a device check from home on 05/01/14. You may send your transmission at any time that day. If you have a wireless device, the transmission will be sent automatically. After your physician reviews your transmission, you will receive a postcard with your next transmission date.

## 2014-01-29 ENCOUNTER — Encounter: Payer: Medicare Other | Admitting: Endocrinology

## 2014-01-29 ENCOUNTER — Ambulatory Visit (HOSPITAL_COMMUNITY)
Admission: RE | Admit: 2014-01-29 | Discharge: 2014-01-29 | Disposition: A | Discharge status: Home / Self Care | Payer: Medicare Other | Source: Ambulatory Visit | Attending: Internal Medicine | Admitting: Internal Medicine

## 2014-01-29 ENCOUNTER — Ambulatory Visit (HOSPITAL_COMMUNITY): Payer: Self-pay | Admitting: *Deleted

## 2014-01-29 VITALS — BP 118/0 | HR 80 | Ht 69.0 in | Wt 158.0 lb

## 2014-01-29 DIAGNOSIS — I2510 Atherosclerotic heart disease of native coronary artery without angina pectoris: Secondary | ICD-10-CM | POA: Insufficient documentation

## 2014-01-29 DIAGNOSIS — I10 Essential (primary) hypertension: Secondary | ICD-10-CM

## 2014-01-29 DIAGNOSIS — I5022 Chronic systolic (congestive) heart failure: Secondary | ICD-10-CM

## 2014-01-29 DIAGNOSIS — Z79899 Other long term (current) drug therapy: Secondary | ICD-10-CM | POA: Insufficient documentation

## 2014-01-29 DIAGNOSIS — E039 Hypothyroidism, unspecified: Secondary | ICD-10-CM | POA: Insufficient documentation

## 2014-01-29 DIAGNOSIS — Z95811 Presence of heart assist device: Secondary | ICD-10-CM

## 2014-01-29 DIAGNOSIS — Z950 Presence of cardiac pacemaker: Secondary | ICD-10-CM | POA: Insufficient documentation

## 2014-01-29 DIAGNOSIS — I2589 Other forms of chronic ischemic heart disease: Secondary | ICD-10-CM

## 2014-01-29 DIAGNOSIS — Z9861 Coronary angioplasty status: Secondary | ICD-10-CM | POA: Insufficient documentation

## 2014-01-29 DIAGNOSIS — Z7901 Long term (current) use of anticoagulants: Secondary | ICD-10-CM

## 2014-01-29 DIAGNOSIS — I509 Heart failure, unspecified: Secondary | ICD-10-CM | POA: Insufficient documentation

## 2014-01-29 LAB — CBC
HCT: 36.5 % — ABNORMAL LOW (ref 39.0–52.0)
Hemoglobin: 11.4 g/dL — ABNORMAL LOW (ref 13.0–17.0)
MCH: 27.3 pg (ref 26.0–34.0)
MCHC: 31.2 g/dL (ref 30.0–36.0)
MCV: 87.5 fL (ref 78.0–100.0)
Platelets: 186 10*3/uL (ref 150–400)
RBC: 4.17 MIL/uL — ABNORMAL LOW (ref 4.22–5.81)
RDW: 17.6 % — ABNORMAL HIGH (ref 11.5–15.5)
WBC: 6.6 10*3/uL (ref 4.0–10.5)

## 2014-01-29 LAB — BASIC METABOLIC PANEL
BUN: 16 mg/dL (ref 6–23)
CO2: 24 mEq/L (ref 19–32)
Calcium: 9.1 mg/dL (ref 8.4–10.5)
Chloride: 104 mEq/L (ref 96–112)
Creatinine, Ser: 1.19 mg/dL (ref 0.50–1.35)
GFR calc Af Amer: 70 mL/min — ABNORMAL LOW (ref >90)
GFR calc non Af Amer: 60 mL/min — ABNORMAL LOW (ref >90)
Glucose, Bld: 89 mg/dL (ref 70–99)
Potassium: 3.8 mEq/L (ref 3.7–5.3)
Sodium: 140 mEq/L (ref 137–147)

## 2014-01-29 LAB — PROTIME-INR
INR: 2.78 — ABNORMAL HIGH (ref 0.00–1.49)
Prothrombin Time: 28.4 seconds — ABNORMAL HIGH (ref 11.6–15.2)

## 2014-01-29 LAB — PRO B NATRIURETIC PEPTIDE: Pro B Natriuretic peptide (BNP): 3360 pg/mL — ABNORMAL HIGH (ref 0–125)

## 2014-01-29 LAB — LACTATE DEHYDROGENASE: LDH: 259 U/L — ABNORMAL HIGH (ref 94–250)

## 2014-01-29 MED ORDER — AMLODIPINE BESYLATE 2.5 MG PO TABS: 2.5000 mg | ORAL_TABLET | Freq: Every day | ORAL | Status: DC

## 2014-01-29 NOTE — Progress Notes (Deleted)
Patient ID: Johnny Oneill, male   DOB: Feb 13, 1941, 73 y.o.   MRN: [id number]   Reason for Appointment:  Hypothyroidism, followup visit    History of Present Illness:   The hypothyroidism was first diagnosed  after radioactive iodine treatment for [name] several years ago  Complaints are reported by the patient now are weak and lightheaded Also has lost significant amount of weight since he has been treated for his worsening heart failure this year On his previous visit in 02/2013 his TSH was excellent at 1.7 with a regimen of 150 mcg Synthroid, 7-1/2 tablets per week  However his TSH level has been consistently high since 09/13/13         It appears that he has been taking 200 mcg daily since last month when he was hospitalized, probably for about 3 weeks although not clear how much he was being given when he was admitted to the rehabilitation floor  He still feels somewhat weak and lightheaded and is concerned about his difficulty gaining weight He also has been anemic and has been taking iron tablets for at least 3 weeks, currently taking this with his levothyroxine in the morning Because of his tendency to weight loss the last 3 days he has taken only 150 mcg daily           Wt Readings from Last 3 Encounters:  01/29/14 158 lb (71.668 kg)  01/24/14 150 lb (68.04 kg)  01/08/14 146 lb 6.4 oz (66.407 kg)    Compliance with the medical regimen has been as prescribed with taking the tablet in the morning before breakfast.  Lab Results  Component Value Date   TSH 7.760* 11/29/2013   TSH 26.457* 11/01/2013   TSH 12.878* 09/13/2013   TSH 1.71 03/14/2013       Medication List       This list is accurate as of: 01/29/14 12:44 PM.  Always use your most recent med list.               amLODipine 2.5 MG tablet  Commonly known as:  NORVASC  Take 1 tablet (2.5 mg total) by mouth daily.     aspirin 325 MG tablet  Take 1 tablet (325 mg total) by mouth daily.     citalopram 10  MG tablet  Commonly known as:  CELEXA  Take 1 tablet (10 mg total) by mouth daily.     CoQ10 200 MG Caps  Take 200 mg by mouth daily.     ezetimibe 10 MG tablet  Commonly known as:  ZETIA  Take 10 mg by mouth daily.     ferrous Q000111Q C-folic acid capsule  Commonly known as:  TRINSICON / FOLTRIN  Take 1 capsule by mouth daily.     furosemide 20 MG tablet  Commonly known as:  LASIX  Take 20 mg by mouth as needed. Taking PRN wt gain     levothyroxine 175 MCG tablet  Commonly known as:  SYNTHROID  Take 1 tablet (175 mcg total) by mouth daily before breakfast.     Omega-3 300 MG Caps  Take 300 mg by mouth at bedtime.     pantoprazole 40 MG tablet  Commonly known as:  PROTONIX  Take 1 tablet (40 mg total) by mouth daily.     rosuvastatin 20 MG tablet  Commonly known as:  CRESTOR  Take 20 mg by mouth daily.     temazepam 15 MG capsule  Commonly known as:  RESTORIL  Take 1 capsule (15 mg total) by mouth at bedtime.     warfarin 5 MG tablet  Commonly known as:  COUMADIN  Take as directed for INR 2.0 - 3.0 (Take 5 mg daily except take 7.5 mg on Wednesday 01/24/14)         Past Medical History  Diagnosis Date  . Ischemic cardiomyopathy     a. 10/09 Echo: Sev LV Dysfxn, inf/lat AK, Mod MR.  [name];  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 Hypertension   . PVC (premature ventricular contraction)   . Pacemaker   . CHF (congestive heart failure)   . Shortness of breath   . Automatic implantable cardioverter-defibrillator in situ     Past Surgical History  Procedure Laterality Date  . Defibrillator  2009  . Total  knee arthroplasty  11/27/11    left  . Insert / replace / remove pacemaker  2009  . Coronary angioplasty with stent placement  2004  . Coronary angioplasty with stent placement  07/2008  . Knee arthroscopy w/ partial medial meniscectomy  12/2002    left  . Knee arthroscopy      bilaterally  . Total knee arthroplasty  11/27/2011    Procedure: TOTAL KNEE ARTHROPLASTY;  Surgeon: Yvette Rack., MD;  Location: [place];  Service: Orthopedics;  Laterality: Left;  left total knee arthroplasty  . Esophagogastroduodenoscopy N/A 09/15/2013    Procedure: ESOPHAGOGASTRODUODENOSCOPY (EGD);  Surgeon: Ladene Artist, MD;  Location: [facility] ENDOSCOPY;  Service: Endoscopy;  Laterality: N/A;  bedside  . Insertion of implantable left ventricular assist device N/A 09/18/2013    Procedure: INSERTION OF IMPLANTABLE LEFT VENTRICULAR ASSIST DEVICE;  Surgeon: Ivin Poot, MD;  Location: [place];  Service: Open Heart Surgery;  Laterality: N/A;  CIRC ARREST  NITRIC OXIDE  . Intraoperative transesophageal echocardiogram N/A 09/18/2013    Procedure: INTRAOPERATIVE TRANSESOPHAGEAL ECHOCARDIOGRAM;  Surgeon: Ivin Poot, MD;  Location: [place];  Service: Open Heart Surgery;  Laterality: N/A;  . Video assisted thoracoscopy Right 11/06/2013    Procedure: VIDEO ASSISTED THORACOSCOPY;  Surgeon: Gaye Pollack, MD;  Location: [place];  Service: Cardiothoracic;  Laterality: Right;  . Hematoma evacuation Right 11/06/2013    Procedure: EVACUATION HEMATOMA;  Surgeon: Gaye Pollack, MD;  Location: [place];  Service: Cardiothoracic;  Laterality: Right;    Family History  Problem Relation Age of Onset  . Heart failure Father   . Stroke Father   . Coronary artery disease Mother   . Heart disease Mother   . Hyperlipidemia Sister     Social History:  reports that he has never smoked. He has never used smokeless tobacco. He reports that he drinks alcohol. He reports that he does not use illicit drugs.  Allergies:  Allergies  Allergen  Reactions  . Ace Inhibitors     Hypotension and elevated creatinine  . Diovan [Valsartan] Other (See Comments)    Hypotension at low dose, hyperkalemia  . Lipitor [Atorvastatin Calcium] Other (See  Comments)    Muscle pain     Examination:   There were no vitals taken for this visit.   GENERAL APPEARANCE:  averagely built and nourished, mildly anxious   FACE: No puffiness of face or periorbital edema.             NEUROLOGIC EXAM:  biceps reflexes appear normal    Assessment/Plan:  Post ablative Hypothyroidism:  He has needed somewhat larger doses of his levothyroxine despite his weight loss Also recently had been taking iron with his thyroid supplement in the morning which may interact with it. His TSH done about 2 weeks ago was checked only about a week or so after going up on his dose to 200 mcg and may not be representative of his actual levels now Most likely will need about 175 mcg and will try this for now Also he will stop taking iron in the morning and take it at  lunchtime  He will need followup levels in about 6 weeks  Elayne Snare 01/29/2014, 12:44 PM

## 2014-01-29 NOTE — Patient Instructions (Addendum)
1.  No change in coumadin dose. Re-check INR on 02/13/14. 2.  Start Norvasc 2.5 mg daily, take at night. If any dizziness stop and call office. 3.  Return to [place] clinic 3 weeks

## 2014-01-29 NOTE — Progress Notes (Signed)
Symptom  Yes  No  Details   Angina        x Ativity:   Claudication        x How far:   Syncope              x When:    Stroke        x   Orthopnea        x How many pillows:   one  PND        x How often:    CPAP    N/A  How many hrs:   Pedal edema        x         144 - 151 lbs one week; 20 mg lasix with relief (lost 2.2 lbs)  Abd fullness        x       One time - above  N&V        x good appetite; eating full meals  Diaphoresis        x When:  Bleeding           x     Urine color        light yellow  SOB            x Activity:   3/4 mile on incline  Palpitations        x When:   ICD shock        x   Hospitlizaitons              x When/where/why:   ED visit        x When/where/why:  Other MD        x When/who/why:  Activity    Cardiac rehab referral; pt doing household chores and working at [employer]  Fluid    No limitations; drinking 1 liter day  Diet     Low salt   Vital signs: HR: 80 MAP BP:  Right arm sitting: 116 Left arm sitting:  118 O2 Sat:  98 Wt:   158 lbs Last weight: 146.4  lbs Ht: 5'9"  LVAD interrogation reveals:  Speed:  9000 Flow:  3.9 Power: 4.8   PI: 8.0 Alarms:  none Events:  none Fixed speed:  9000  RPM Low speed limit:  8400 RPM  LVAD exit site:   Driveline completely healed and incorporated. The velour is fully implanted at exit site. Sorbaview dressing with biopatch intact. Dressing change using sterile technique performed by wife. No redness, drainage, tenderness, or foul odor noted.  Stabilization device present and accurately applied. Driveline dressing is being changed weekly per sterile technique using sorbaview dressing. Pt denies fever or chills. Shower not available at home; has not started showers.  I reviewed the LVAD parameters from today, and compared the results to the patient's prior recorded data. The LVAD is functioning within specified parameters. LVAD interrogation was negative for any significant power changes or alarms.      Pt/caregiver deny any alarms or VAD equipment issues. VAD coordinator reviewed daily log from home for daily temperature, weight, and VAD parameters. Pt is performing daily controller and system monitor self tests along with completing weekly and monthly maintenance for LVAD equipment.   LVAD equipment check completed and is in good working order. Back-up equipment present. LVAD education done on emergency procedures and precautions and reviewed exit site care.

## 2014-01-30 DIAGNOSIS — I10 Essential (primary) hypertension: Secondary | ICD-10-CM | POA: Insufficient documentation

## 2014-01-30 NOTE — Progress Notes (Signed)
Patient ID: Johnny Oneill, male   DOB: 10-Jul-1941, 73 y.o.   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   Admitted 3/4-3/25/15 with SOB and syncopal episode. Found to have SBO and NG placed and started on TPN. On 11/06/13 developed progressive SOB and hypotension and co-ox 51% and Hgb 6.7. Started on milrinone and transfused. ECHo showed nl RV function. Patient had PEA arrest and was intubated and started on pressors. CXR revelaed a R hemothorax and a R CT placed. He required VATs  and evacuation of hemothorax and then tansferred to CIR.   He returns for 3 week follow up. Doing well, no further complaints of dizziness or syncope since stopping Lisinopril and Aldactone. Taking Lasix 20 mg prn weight gain; took one time last week for wt gain from 144 - 152 over one week with wt loss of 2.2 lbs. Wt today 158 lbs (home weight 151). Denies SOB/PND/Orthopnea. Last week he attended [employer] basketball camp; walked 3/4 mile to parking lot and SOB noted at end of 3/4 mile on incline.  Appetite improving. Cardiac rehab has not contacted patient, but he feels he is active with walking, returning to cleaning church, and yard work. Denies need for rehab at this time. Complaint with meds. He has been intolerant of ACE inhibitors.  Patient saw Dr. Rayann Heman 01/24/14 for device follow up. Per note: pt is stable and on an appropriate medical regimen. Optivol index was increased but had not yet crossed threshold. Normal ICD function. R waves have decreased since LVAD placement; CXR revealed stable lead position. He plans to follow clinically and try to avoid lead revision if possible.   BP today:  Right arm (sitting)  Left arm (sitting)  116    118  Denies LVAD alarms.  Denies driveline trauma, erythema or drainage.  Denies ICD shocks.    Reports taking Coumadin as prescribed and  adherence to anticoagulation based dietary restrictions.  Denies bright red blood per rectum or melena, no dark urine or hematuria.     Past Medical History  Diagnosis Date  . Ischemic cardiomyopathy     a. 10/09 Echo: Sev LV Dysfxn, inf/lat AK, Mod MR.  [name];  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 Hypertension   . PVC (premature ventricular contraction)   . Pacemaker   . CHF (congestive heart failure)   . Shortness of breath   . Automatic implantable cardioverter-defibrillator in situ     Current Outpatient Prescriptions  Medication Sig Dispense Refill  . aspirin 325 MG tablet Take 1 tablet (325 mg total) by mouth daily.      . citalopram (CELEXA) 10 MG tablet Take 1 tablet (10 mg total) by mouth daily.  30 tablet  6  . Coenzyme Q10 (COQ10) 200 MG CAPS Take 200 mg by mouth daily.      [name] ezetimibe (ZETIA) 10 MG  tablet Take 10 mg by mouth daily.       . ferrous Q000111Q C-folic acid (TRINSICON / FOLTRIN) capsule Take 1 capsule by mouth daily.       . furosemide (LASIX) 20 MG tablet Take 20 mg by mouth as needed. Taking PRN wt gain      . levothyroxine (SYNTHROID) 175 MCG tablet Take 1 tablet (175 mcg total) by mouth daily before breakfast.  30 tablet  1  . Omega-3 300 MG CAPS Take 300 mg by mouth at bedtime.       . pantoprazole (PROTONIX) 40 MG tablet Take 1 tablet (40 mg total) by mouth daily.  30 tablet  1  . rosuvastatin (CRESTOR) 20 MG tablet Take 20 mg by mouth daily.      . temazepam (RESTORIL) 15 MG capsule Take 1 capsule (15 mg total) by mouth at bedtime.  30 capsule  3  . warfarin (COUMADIN) 5 MG tablet Take as directed  for INR 2.0 - 3.0 (Take 5 mg daily except take 7.5 mg on Wednesday 01/24/14)      . amLODipine (NORVASC) 2.5 MG tablet Take 1 tablet (2.5 mg total) by mouth daily.  30 tablet  6   No current facility-administered medications for this encounter.    Ace inhibitors; Diovan; and Lipitor  REVIEW OF SYSTEMS: All systems negative except as listed in HPI, PMH and Problem list.  Filed Vitals:   01/29/14 1056  BP: 118/0  Pulse: 80  Height: 5\' 9"  (1.753 m)  Weight: 158 lb (71.668 kg)  SpO2: 98%   LVAD INTERROGATION:   HeartMate II LVAD:  Speed: 9000 Flow 3.9 Power: 4.8 PI: 8.0 Alarms: None Events: none Fixed Speed: 9000 Low speed: 8400     I reviewed the LVAD parameters from today, and compared the results to the patient's prior recorded data.  No programming changes were made.  The LVAD is functioning within specified parameters.  The patient performs LVAD self-test daily.  LVAD interrogation was negative for any significant power changes, alarms or PI events/speed drops.  LVAD equipment check completed and is in good working order.  Back-up equipment present.   LVAD education done on emergency procedures and precautions and reviewed exit site care.    Filed Vitals:   01/29/14 1056  BP: 118/0  Pulse: 80  Height: 5\' 9"  (1.753 m)  Weight: 158 lb (71.668 kg)  SpO2: 98%  Last weight:   146.4   MAP 118;  116  Physical Exam: GENERAL: Well appearing, male who presents to clinic today in no acute distress; Wife present HEENT: normal  NECK: Supple, JVP   ; no bruits.  No lymphadenopathy or thyromegaly appreciated.    CARDIAC:  Mechanical heart sounds with LVAD hum present.  LUNGS:  Clear to auscultation bilaterally.  ABDOMEN:  Soft, round, nontender, positive bowel sounds x4.     LVAD exit site: well-healed and semi incorporated.  Dressing dry and intact.  No erythema or drainage.  Stabilization device present and accurately applied.  Driveline dressing is being changed weekly per  sterile technique. EXTREMITIES:  Warm and dry, no cyanosis, clubbing, rash.  1+ bilateral edema  NEUROLOGIC:  Alert and oriented x 4.  Gait steady.  No aphasia.  No dysarthria.  Affect pleasant.     ASSESSMENT AND PLAN:   1) Chronic systolic HF: ICM, EF 0000000 s/p LVAD HM II implant 08/2013.    - NYHA II symptoms and volume status stable. He is on PRN Lasix  20 mg  - MAP up after stopping Lisinopril and Aldactone. Will start Norvasc 2.5 daily; advised pt to take in the evenings. If any dizziness, instructed to stop and notify LVAD team.  - Not on BB with history of RV failure - Reinforced the need and importance of daily weights, a low sodium diet, and fluid restriction (less than 2 L a day). Instructed to call the HF clinic if weight increases more than 3 lbs overnight or 5 lbs in a week.  2) LVAD - Doing well. All LVAD parameters nl. Will check LDH, CBD, INR and BMET today.  - As above MAP up. Appetitie improving - Currently status 7 on tx list at [facility]. Contacted [facility] to begin workup for re-activating status. 3) Chronic anticoagulation - INR goal 2.0-3.0. No bleeding issues. INR therapeutic today. - Continue ASA 325 mg for LVAD 4) HTN - Stopped Lisinopril and Aldactone for dizziness and elevated creatinine. Creat back to baseline. - Will start Norvasc 2.5 mg daily for elevated MAP today. 5) Hypothyroidism Per Dr Dwyane Dee. Synthroid cut back to 175 mcg. Will need repeat TSH in 6 weeks.   Follow up in 3 weeks. We have contacted Dr. Emelia Loron at [facility] to restart the transplant process. He will need RHC next week to re-assess hemodynamics.   Jolaine Artist MD 9:50 PM

## 2014-01-31 ENCOUNTER — Ambulatory Visit (INDEPENDENT_AMBULATORY_CARE_PROVIDER_SITE_OTHER): Payer: Medicare Other | Admitting: Endocrinology

## 2014-01-31 ENCOUNTER — Encounter: Payer: Self-pay | Admitting: Endocrinology

## 2014-01-31 ENCOUNTER — Other Ambulatory Visit (HOSPITAL_COMMUNITY): Payer: Self-pay | Admitting: *Deleted

## 2014-01-31 VITALS — HR 96 | Temp 98.3°F | Resp 14 | Ht 68.0 in | Wt 152.8 lb

## 2014-01-31 DIAGNOSIS — I2589 Other forms of chronic ischemic heart disease: Secondary | ICD-10-CM

## 2014-01-31 DIAGNOSIS — E890 Postprocedural hypothyroidism: Secondary | ICD-10-CM

## 2014-01-31 LAB — T4, FREE: Free T4: 1.77 ng/dL — ABNORMAL HIGH (ref 0.60–1.60)

## 2014-01-31 LAB — TSH: TSH: 0.75 u[IU]/mL (ref 0.35–4.50)

## 2014-01-31 MED ORDER — ROSUVASTATIN CALCIUM 20 MG PO TABS: 20.0000 mg | ORAL_TABLET | Freq: Every day | ORAL | Status: DC

## 2014-01-31 MED ORDER — EZETIMIBE 10 MG PO TABS: 10.0000 mg | ORAL_TABLET | Freq: Every day | ORAL | Status: DC

## 2014-01-31 NOTE — Progress Notes (Signed)
Patient ID: Johnny Oneill, male   DOB: [birth year], 73 y.o.   MRN: [id number]   Reason for Appointment:  Hypothyroidism, followup visit    History of Present Illness:   The hypothyroidism was first diagnosed  after radioactive iodine treatment for [name] several years ago  Complaints are reported by the patient now are weak and lightheaded Also had lost significant amount of weight since he has been treated for his worsening heart failure this year On a previous visit in 02/2013 his TSH was excellent at 1.7 with a regimen of 150 mcg Synthroid, 7-1/2 tablets per week  However his TSH level had been consistently high since 09/13/13         He was taking 200 mcg daily since about 3/15 when he was hospitalized; and TSH had not been back to normal  He is now feeling less weak and has no energy Also has gained back some weight On his last visit he was taking his medication with iron in the morning and was told to change this, not taking iron in evening He was empirically started on 175 mcg 4/15   Compliance with the medical regimen has been as prescribed with taking the tablet in the morning before breakfast.  Lab Results  Component Value Date   TSH 7.760* 11/29/2013   TSH 26.457* 11/01/2013   TSH 12.878* 09/13/2013   TSH 1.71 03/14/2013       Medication List       This list is accurate as of: 01/31/14  3:28 PM.  Always use your most recent med list.               amLODipine 2.5 MG tablet  Commonly known as:  NORVASC  Take 1 tablet (2.5 mg total) by mouth daily.     aspirin 325 MG tablet  Take 1 tablet (325 mg total) by mouth daily.     citalopram 10 MG tablet  Commonly known as:  CELEXA  Take 1 tablet (10 mg total) by mouth daily.     CoQ10 200 MG Caps  Take 200 mg by mouth daily.     ezetimibe 10 MG tablet  Commonly known as:  ZETIA  Take 1 tablet (10 mg total) by mouth daily.     ferrous Q000111Q C-folic acid capsule  Commonly known as:   TRINSICON / FOLTRIN  Take 1 capsule by mouth daily.     furosemide 20 MG tablet  Commonly known as:  LASIX  Take 20 mg by mouth as needed. Taking PRN wt gain     geriatric multivitamins-minerals Elix     levothyroxine 175 MCG tablet  Commonly known as:  SYNTHROID  Take 1 tablet (175 mcg total) by mouth daily before breakfast.     Omega-3 300 MG Caps  Take 300 mg by mouth at bedtime.     pantoprazole 40 MG tablet  Commonly known as:  PROTONIX  Take 1 tablet (40 mg total) by mouth daily.     rosuvastatin 20 MG tablet  Commonly known as:  CRESTOR  Take 1 tablet (20 mg total) by mouth daily.     temazepam 15 MG capsule  Commonly known as:  RESTORIL  Take 1 capsule (15 mg total) by mouth at bedtime.     warfarin 5 MG tablet  Commonly known as:  COUMADIN  Take as directed for INR 2.0 - 3.0 (Take 5 mg daily except take 7.5 mg on Wednesday 01/24/14)  Past Medical History  Diagnosis Date  . Ischemic cardiomyopathy     a. 10/09 Echo: Sev LV Dysfxn, inf/lat AK, Mod MR.  [name];  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 Hypertension   . PVC (premature ventricular contraction)   . Pacemaker   . CHF (congestive heart failure)   . Shortness of breath   . Automatic implantable cardioverter-defibrillator in situ     Past Surgical History  Procedure Laterality Date  . Defibrillator  2009  . Total knee arthroplasty  11/27/11    left  . Insert / replace / remove pacemaker  2009  . Coronary angioplasty with stent placement  2004  . Coronary angioplasty with stent placement  07/2008  . Knee arthroscopy w/ partial medial meniscectomy   12/2002    left  . Knee arthroscopy      bilaterally  . Total knee arthroplasty  11/27/2011    Procedure: TOTAL KNEE ARTHROPLASTY;  Surgeon: Yvette Rack., MD;  Location: [place];  Service: Orthopedics;  Laterality: Left;  left total knee arthroplasty  . Esophagogastroduodenoscopy N/A 09/15/2013    Procedure: ESOPHAGOGASTRODUODENOSCOPY (EGD);  Surgeon: Ladene Artist, MD;  Location: [facility] ENDOSCOPY;  Service: Endoscopy;  Laterality: N/A;  bedside  . Insertion of implantable left ventricular assist device N/A 09/18/2013    Procedure: INSERTION OF IMPLANTABLE LEFT VENTRICULAR ASSIST DEVICE;  Surgeon: Ivin Poot, MD;  Location: [place];  Service: Open Heart Surgery;  Laterality: N/A;  CIRC ARREST  NITRIC OXIDE  . Intraoperative transesophageal echocardiogram N/A 09/18/2013    Procedure: INTRAOPERATIVE TRANSESOPHAGEAL ECHOCARDIOGRAM;  Surgeon: Ivin Poot, MD;  Location: [place];  Service: Open Heart Surgery;  Laterality: N/A;  . Video assisted thoracoscopy Right 11/06/2013    Procedure: VIDEO ASSISTED THORACOSCOPY;  Surgeon: Gaye Pollack, MD;  Location: [place];  Service: Cardiothoracic;  Laterality: Right;  . Hematoma evacuation Right 11/06/2013    Procedure: EVACUATION HEMATOMA;  Surgeon: Gaye Pollack, MD;  Location: [place];  Service: Cardiothoracic;  Laterality: Right;    Family History  Problem Relation Age of Onset  . Heart failure Father   . Stroke Father   . Coronary artery disease Mother   . Heart disease Mother   . Hyperlipidemia Sister     Social History:  reports that he has never smoked. He has never used smokeless tobacco. He reports that he drinks alcohol. He reports that he does not use illicit drugs.  Allergies:  Allergies  Allergen Reactions  . Ace Inhibitors     Hypotension and elevated creatinine  . Diovan [Valsartan] Other (See Comments)    Hypotension at low dose, hyperkalemia  . Lipitor [Atorvastatin Calcium] Other (See Comments)    Muscle pain   ROS:  Off  lisinopril, now on Norvasc Appettite is good    Examination:   BP   Pulse 96  Temp(Src) 98.3 F (36.8 C)  Resp 14  Ht 5\' 8"  (1.727 m)  Wt 152 lb 12.8 oz (69.31 kg)  BMI  23.24 kg/m2  SpO2 95%   GENERAL APPEARANCE:  averagely built and nourished, pleasant   FACE: No puffiness of face or periorbital edema.             NEUROLOGIC EXAM:  biceps reflexes appear normal    Assessment/Plan:  Post ablative Hypothyroidism:  He has needed somewhat larger doses of his levothyroxine despite his weight loss in the last few months Subjectively he has felt better although most of his symptoms are related to other medical issues  He will need followup levels today and decide on a dosage adjustment, may need to follow his levels more closely  Elayne Snare 01/31/2014, 3:28 PM   Addendum: TSH okay  Lab Results  Component Value Date   TSH 0.75 01/31/2014

## 2014-01-31 NOTE — Progress Notes (Signed)
Quick Note:  Please let patient know that the TSH result is normal. Continue same dose, would like followup in 3 months office visit and lab to ensure stability ______

## 2014-02-01 ENCOUNTER — Telehealth: Payer: Self-pay | Admitting: *Deleted

## 2014-02-01 NOTE — Telephone Encounter (Signed)
Wife called to report since starting Norvasc (first dose Tuesday), pt has been feeling nauseated, unable to eat, and weak. She gave second dose last night with increased symptoms today, per Junie Bame, NP instructed her to stop medication. Wife verbalized understanding of same.

## 2014-02-04 ENCOUNTER — Telehealth (HOSPITAL_COMMUNITY): Payer: Self-pay | Admitting: *Deleted

## 2014-02-04 DIAGNOSIS — Z95811 Presence of heart assist device: Secondary | ICD-10-CM

## 2014-02-04 DIAGNOSIS — Z7901 Long term (current) use of anticoagulants: Secondary | ICD-10-CM

## 2014-02-05 ENCOUNTER — Ambulatory Visit (HOSPITAL_BASED_OUTPATIENT_CLINIC_OR_DEPARTMENT_OTHER)
Admission: RE | Admit: 2014-02-05 | Discharge: 2014-02-05 | Disposition: A | Discharge status: Home / Self Care | Payer: Medicare Other | Source: Ambulatory Visit | Attending: Internal Medicine | Admitting: Internal Medicine

## 2014-02-05 ENCOUNTER — Ambulatory Visit (HOSPITAL_COMMUNITY): Payer: Self-pay | Admitting: *Deleted

## 2014-02-05 VITALS — BP 132/0 | HR 84 | Ht 69.0 in | Wt 153.6 lb

## 2014-02-05 DIAGNOSIS — I5022 Chronic systolic (congestive) heart failure: Secondary | ICD-10-CM | POA: Insufficient documentation

## 2014-02-05 DIAGNOSIS — E039 Hypothyroidism, unspecified: Secondary | ICD-10-CM | POA: Insufficient documentation

## 2014-02-05 DIAGNOSIS — Z7901 Long term (current) use of anticoagulants: Secondary | ICD-10-CM

## 2014-02-05 DIAGNOSIS — I10 Essential (primary) hypertension: Secondary | ICD-10-CM

## 2014-02-05 DIAGNOSIS — Z95811 Presence of heart assist device: Secondary | ICD-10-CM

## 2014-02-05 DIAGNOSIS — I509 Heart failure, unspecified: Secondary | ICD-10-CM

## 2014-02-05 LAB — COMPREHENSIVE METABOLIC PANEL
ALT: 75 U/L — ABNORMAL HIGH (ref 0–53)
AST: 43 U/L — ABNORMAL HIGH (ref 0–37)
Albumin: 3.7 g/dL (ref 3.5–5.2)
Alkaline Phosphatase: 171 U/L — ABNORMAL HIGH (ref 39–117)
BUN: 13 mg/dL (ref 6–23)
CO2: 29 mEq/L (ref 19–32)
Calcium: 9.4 mg/dL (ref 8.4–10.5)
Chloride: 102 mEq/L (ref 96–112)
Creatinine, Ser: 1.38 mg/dL — ABNORMAL HIGH (ref 0.50–1.35)
GFR calc Af Amer: 58 mL/min — ABNORMAL LOW (ref >90)
GFR calc non Af Amer: 50 mL/min — ABNORMAL LOW (ref >90)
Glucose, Bld: 94 mg/dL (ref 70–99)
Potassium: 3.8 mEq/L (ref 3.7–5.3)
Sodium: 143 mEq/L (ref 137–147)
Total Bilirubin: 1.2 mg/dL (ref 0.3–1.2)
Total Protein: 7.9 g/dL (ref 6.0–8.3)

## 2014-02-05 LAB — CBC
HCT: 40 % (ref 39.0–52.0)
Hemoglobin: 12.7 g/dL — ABNORMAL LOW (ref 13.0–17.0)
MCH: 27.7 pg (ref 26.0–34.0)
MCHC: 31.8 g/dL (ref 30.0–36.0)
MCV: 87.1 fL (ref 78.0–100.0)
Platelets: 234 10*3/uL (ref 150–400)
RBC: 4.59 MIL/uL (ref 4.22–5.81)
RDW: 18.4 % — ABNORMAL HIGH (ref 11.5–15.5)
WBC: 7.5 10*3/uL (ref 4.0–10.5)

## 2014-02-05 LAB — LACTATE DEHYDROGENASE: LDH: 323 U/L — ABNORMAL HIGH (ref 94–250)

## 2014-02-05 LAB — PROTIME-INR
INR: 3.43 — ABNORMAL HIGH (ref 0.00–1.49)
Prothrombin Time: 33.3 seconds — ABNORMAL HIGH (ref 11.6–15.2)

## 2014-02-05 LAB — PRO B NATRIURETIC PEPTIDE: Pro B Natriuretic peptide (BNP): 5665 pg/mL — ABNORMAL HIGH (ref 0–125)

## 2014-02-05 MED ORDER — HYDRALAZINE HCL 25 MG PO TABS: 12.5000 mg | ORAL_TABLET | Freq: Three times a day (TID) | ORAL | Status: DC

## 2014-02-05 NOTE — Telephone Encounter (Signed)
Wife called VAD pager to report pt "not feeling well". Confirms he stopped Norvasc, but continues to feel fatigued, no appetite, pedal edema. Wife gave Lasix 40 mg today with good urine output. Denies CP, PND, palpitations, ICD shocks. No change in VAD parameters. Thinks his urine is darker; asked her to monitor urine color today. Asked if he needed to come to ED, she says patient will not come at this time. Dr. Haroldine Laws updated - instructed wife to have pt report to clinic tomorrow for sick visit with labs and VAD check. Instructed to report to ED if symptoms worsen or do not improve tonight; wife verbalized understanding of same.

## 2014-02-05 NOTE — Progress Notes (Signed)
Symptom  Yes  No  Details   Angina        x Ativity:   Claudication        x How far:   Syncope              x When:  Dizziness with Norvasc 2.5  Stroke        x   Orthopnea        x How many pillows:   one  PND        x How often:    CPAP    N/A  How many hrs:   Pedal edema        x         Yesterday - lasix 40 mg with relief  Abd fullness        x       Yesterday - relieved with lasix  N&V        x Poor appetite  Diaphoresis        x When:  Bleeding           x     Urine color        light yellow  SOB            x Activity:   SOB prior to lasix yesterday; fatigue all week; better after lasix  Palpitations        x When:   ICD shock        x   Hospitlizaitons              x When/where/why:   ED visit        x When/where/why:  Other MD         x       When/who/why:  Dr. Dwyane Dee - continue Synthroid 175 daily  Activity    Pt doing household chores and working at [employer]  Fluid    No limitations; drinking 1 liter day  Diet     Low salt   Vital signs: HR: 84 MAP BP:  132; 110 O2 Sat:  98 Wt:   153.6  lbs Last weight: 158  lbs Ht: 5'9"  LVAD interrogation reveals:  Speed:  9000 Flow:  4.0 Power: 4.6 PI: 7.6 Alarms:  Four low flow alarms 02/03/14 am Events:  Rare PI Fixed speed:  9000  RPM Low speed limit:  8400 RPM  LVAD exit site:   Driveline completely healed and incorporated. The velour is fully implanted at exit site. Sorbaview dressing with biopatch intact. Dressing change using sterile technique performed by wife. No redness, drainage, tenderness, or foul odor noted.  Stabilization device present and accurately applied. Driveline dressing is being changed weekly per sterile technique using sorbaview dressing. Pt denies fever or chills. Shower not available at home; has not started showers.  I reviewed the LVAD parameters from today, and compared the results to the patient's prior recorded data. The LVAD is functioning within specified parameters. LVAD interrogation was  positive for four low flow alarms 02/03/14 early am.     Pt/caregiver deny any alarms or VAD equipment issues. VAD coordinator reviewed daily log from home for daily temperature, weight, and VAD parameters. Pt is performing daily controller and system monitor self tests along with completing weekly and monthly maintenance for LVAD equipment.   LVAD equipment check completed and is in good working order. Back-up equipment present. LVAD education done on emergency procedures and precautions and reviewed exit site care.

## 2014-02-05 NOTE — Patient Instructions (Signed)
1.  Start Hydralazine 12.5 mg three times daily 2.  Daily weights - if weight up Wednesday, call us to postpone right heart cath. 3.  Come to admissions 6 am Thursday am for right heart cath at 8 am. 4.  Hold coumadin today; then change daily dose to 5 mg daily.  May eat extra small helping greens today.

## 2014-02-05 NOTE — Progress Notes (Signed)
Patient ID: RAHMAN AGOSTA, male   DOB: 1941/03/15, 73 y.o.   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   Admitted 3/4-3/25/15 with SOB and syncopal episode. Found to have SBO and NG placed and started on TPN. On 11/06/13 developed progressive SOB and hypotension and co-ox 51% and Hgb 6.7. Started on milrinone and transfused. ECHo showed nl RV function. Patient had PEA arrest and was intubated and started on pressors. CXR revelaed a R hemothorax and a R CT placed. He required VATs  and evacuation of hemothorax and then tansferred to CIR.   Patient saw Dr. Rayann Heman 01/24/14 for device follow up. Per note: pt is stable and on an appropriate medical regimen. Normal ICD function. R waves have decreased since LVAD placement; CXR revealed stable lead position. He plans to follow clinically and try to avoid lead revision if possible.  He returns for an acute work in due to fatigue. Last week he started norvasc 2.5 mg but had nausea and later stopped 2 days later.On 6/6 he had 4 low flow alarms.  Yesterday he took 40 mg of lasix for increased weight and dyspnea.  He  diuresed 4 pounds back to 148 pounds. Denies orthopnea/PND. He has been intolerant of ACE inhibitors and calcium channel blockers.   Denies LVAD alarms.  Denies driveline trauma, erythema or drainage.  Denies ICD shocks.    Reports taking Coumadin as prescribed and adherence to anticoagulation based dietary restrictions.  Denies bright red blood per rectum or melena, no dark urine or hematuria.     Past Medical History  Diagnosis Date  . Ischemic cardiomyopathy     a. 10/09 Echo: Sev LV Dysfxn, inf/lat AK, Mod MR.  [name];  s/p Lat MI 2009 with occlusion of  the LCX - treated with Promus stenting. Has total occlusion of the RCA.   . ICD (implantable cardiac defibrillator) in place     PROPHYLACTIC      medtronic  . RBBB   . 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 Hypertension   . PVC (premature ventricular contraction)   . Pacemaker   . CHF (congestive heart failure)   . Shortness of breath   . Automatic implantable cardioverter-defibrillator in situ     Current Outpatient Prescriptions  Medication Sig Dispense Refill  . aspirin 325 MG tablet Take 1 tablet (325 mg total) by mouth daily.      . citalopram (CELEXA) 10 MG tablet Take 1 tablet (10 mg total) by mouth daily.  30 tablet  6  . Coenzyme Q10 (COQ10) 200 MG CAPS Take 200 mg by mouth daily.      [name] ezetimibe (ZETIA) 10 MG tablet Take 1 tablet (10 mg total) by mouth daily.  90 tablet  3  . ferrous Q000111Q C-folic acid (TRINSICON / FOLTRIN) capsule Take 1 capsule by mouth daily.       . furosemide (LASIX) 20 MG tablet Take 20 mg by mouth as needed. Taking PRN wt gain      . geriatric multivitamins-minerals (ELDERTONIC/GEVRABON) ELIX       . levothyroxine (SYNTHROID) 175 MCG tablet Take 1 tablet (175 mcg  total) by mouth daily before breakfast.  30 tablet  1  . Omega-3 300 MG CAPS Take 300 mg by mouth at bedtime.       . pantoprazole (PROTONIX) 40 MG tablet Take 1 tablet (40 mg total) by mouth daily.  30 tablet  1  . rosuvastatin (CRESTOR) 20 MG tablet Take 1 tablet (20 mg total) by mouth daily.  90 tablet  3  . temazepam (RESTORIL) 15 MG capsule Take 1 capsule (15 mg total) by mouth at bedtime.  30 capsule  3  . warfarin (COUMADIN) 5 MG tablet Take as directed for INR 2.0 - 3.0 (Take 5 mg daily except take 7.5 mg on Wednesday 01/24/14)       No current facility-administered medications for this encounter.    Ace inhibitors; Diovan; and Lipitor  REVIEW OF SYSTEMS: All systems negative except as listed in HPI, PMH and Problem list.  Filed Vitals:   02/05/14 0933  BP: 132/0  Pulse:  84  Height: 5\' 9"  (1.753 m)  Weight: 153 lb 9.6 oz (69.673 kg)  SpO2: 98%   LVAD INTERROGATION:  Had low flow alarms noted 6/6  HeartMate II LVAD:  Speed: 9000 Flow 4.1 Power: 4.7 PI: 7.8 Alarms: None Events: none Fixed Speed: 9000 Low speed: 8400     I reviewed the LVAD parameters from today, and compared the results to the patient's prior recorded data.  No programming changes were made.  The LVAD is functioning within specified parameters.  The patient performs LVAD self-test daily.  LVAD interrogation was negative for any significant power changes, alarms or PI events/speed drops.  LVAD equipment check completed and is in good working order.  Back-up equipment present.   LVAD education done on emergency procedures and precautions and reviewed exit site care.    Filed Vitals:   02/05/14 0933  BP: 132/0  Pulse: 84  Height: 5\' 9"  (1.753 m)  Weight: 153 lb 9.6 oz (69.673 kg)  SpO2: 98%     MAP 132  Physical Exam: GENERAL: Well appearing, male who presents to clinic today in no acute distress; Wife present HEENT: normal  NECK: Supple, JVP   ; no bruits.  No lymphadenopathy or thyromegaly appreciated.    CARDIAC:  Mechanical heart sounds with LVAD hum present.  LUNGS:  Clear to auscultation bilaterally.  ABDOMEN:  Soft, round, nontender, positive bowel sounds x4.     LVAD exit site: well-healed and semi incorporated.  Dressing dry and intact.  No erythema or drainage.  Stabilization device present and accurately applied.  Driveline dressing is being changed weekly per sterile technique. EXTREMITIES:  Warm and dry, no cyanosis, clubbing, rash.  No lower extremity edema  NEUROLOGIC:  Alert and oriented x 4.  Gait steady.  No aphasia.  No dysarthria.  Affect pleasant.     ASSESSMENT AND PLAN:   1) Chronic systolic HF: ICM, EF 0000000 s/p LVAD HM II implant 08/2013.    - NYHA II symptoms and volume status stable. He is on PRN Lasix 20 mg  - Not on BB with history of RV  failure -MAP up today 132. Add 12.5 mg hydralazine tid  - Plan for RHC this week.  - Reinforced the need and importance of daily weights, a low sodium diet, and fluid restriction (less than 2 L a day). Instructed to call the HF clinic if weight increases more than 3 lbs overnight or 5 lbs in a week.  2) LVAD - Doing well. All LVAD  parameters nl. Will check LDH, CBD, INR and BMET today.  - As above MAP up.  - Currently status 7 on tx list at [facility]. Contacted [facility] to begin workup for re-activating status. 3) Chronic anticoagulation - INR goal 2.0-3.0. No bleeding issues. Adjust coumadin accordingly. . - Continue ASA 325 mg for LVAD 4) HTN - Elevated. Intolerant Lisinopril, Aldactone, and Norvasc Add 12.5 mg hydralazine tid.  5) Hypothyroidism Per Dr Dwyane Dee. Synthroid cut back to 175 mcg. Will need repeat TSH in 6 weeks.   Follow up on Thursday for  RHC.    Amy D Clegg NP-C  9:38 AM  Patient seen and examined with Darrick Grinder, NP. We discussed all aspects of the encounter. I agree with the assessment and plan as stated above. Doing very well. MAPs running high. Agree with adding hydralazine (he has been unable to tolerate other agents.) Reinforced need for daily weights and reviewed use of sliding scale diuretics. Will reinitiate transplant process. i have d/w Dr. Emelia Loron at [facility] who agrees. Plan RHC this week.   Izumi Mixon,MD 11:39 AM

## 2014-02-06 ENCOUNTER — Encounter (HOSPITAL_COMMUNITY): Payer: Self-pay

## 2014-02-07 ENCOUNTER — Other Ambulatory Visit (HOSPITAL_COMMUNITY): Payer: Medicare Other | Admitting: Adult Health

## 2014-02-07 ENCOUNTER — Other Ambulatory Visit (HOSPITAL_COMMUNITY): Payer: Self-pay | Admitting: Adult Health

## 2014-02-07 DIAGNOSIS — I5022 Chronic systolic (congestive) heart failure: Secondary | ICD-10-CM

## 2014-02-07 DIAGNOSIS — I5023 Acute on chronic systolic (congestive) heart failure: Secondary | ICD-10-CM

## 2014-02-08 ENCOUNTER — Ambulatory Visit (HOSPITAL_COMMUNITY): Payer: Medicare Other

## 2014-02-08 ENCOUNTER — Encounter (HOSPITAL_COMMUNITY): Payer: Medicare Other | Admitting: Certified Registered"

## 2014-02-08 ENCOUNTER — Ambulatory Visit (HOSPITAL_COMMUNITY): Payer: Medicare Other | Admitting: Certified Registered"

## 2014-02-08 ENCOUNTER — Encounter (HOSPITAL_COMMUNITY): Payer: Self-pay | Admitting: *Deleted

## 2014-02-08 ENCOUNTER — Inpatient Hospital Stay (HOSPITAL_COMMUNITY)
Admission: RE | Admit: 2014-02-08 | Discharge: 2014-02-12 | DRG: 919 | Disposition: A | Discharge status: Home / Self Care | Payer: Medicare Other | Source: Ambulatory Visit | Attending: Internal Medicine | Admitting: Internal Medicine

## 2014-02-08 ENCOUNTER — Encounter (HOSPITAL_COMMUNITY)
Admission: RE | Disposition: A | Discharge status: Home / Self Care | Payer: Medicare Other | Source: Ambulatory Visit | Attending: Internal Medicine

## 2014-02-08 ENCOUNTER — Inpatient Hospital Stay (HOSPITAL_COMMUNITY): Payer: Medicare Other

## 2014-02-08 DIAGNOSIS — J9600 Acute respiratory failure, unspecified whether with hypoxia or hypercapnia: Secondary | ICD-10-CM

## 2014-02-08 DIAGNOSIS — I4510 Unspecified right bundle-branch block: Secondary | ICD-10-CM | POA: Diagnosis present

## 2014-02-08 DIAGNOSIS — R7309 Other abnormal glucose: Secondary | ICD-10-CM | POA: Diagnosis present

## 2014-02-08 DIAGNOSIS — Z9861 Coronary angioplasty status: Secondary | ICD-10-CM | POA: Diagnosis not present

## 2014-02-08 DIAGNOSIS — Y9210 Unspecified residential institution as the place of occurrence of the external cause: Secondary | ICD-10-CM | POA: Diagnosis present

## 2014-02-08 DIAGNOSIS — I252 Old myocardial infarction: Secondary | ICD-10-CM

## 2014-02-08 DIAGNOSIS — I10 Essential (primary) hypertension: Secondary | ICD-10-CM | POA: Diagnosis present

## 2014-02-08 DIAGNOSIS — Z7901 Long term (current) use of anticoagulants: Secondary | ICD-10-CM | POA: Diagnosis not present

## 2014-02-08 DIAGNOSIS — I509 Heart failure, unspecified: Secondary | ICD-10-CM | POA: Diagnosis present

## 2014-02-08 DIAGNOSIS — Z96659 Presence of unspecified artificial knee joint: Secondary | ICD-10-CM | POA: Diagnosis not present

## 2014-02-08 DIAGNOSIS — I2589 Other forms of chronic ischemic heart disease: Secondary | ICD-10-CM | POA: Diagnosis present

## 2014-02-08 DIAGNOSIS — E7800 Pure hypercholesterolemia, unspecified: Secondary | ICD-10-CM | POA: Diagnosis present

## 2014-02-08 DIAGNOSIS — Z95810 Presence of automatic (implantable) cardiac defibrillator: Secondary | ICD-10-CM

## 2014-02-08 DIAGNOSIS — E876 Hypokalemia: Secondary | ICD-10-CM | POA: Diagnosis present

## 2014-02-08 DIAGNOSIS — R1310 Dysphagia, unspecified: Secondary | ICD-10-CM | POA: Diagnosis present

## 2014-02-08 DIAGNOSIS — E039 Hypothyroidism, unspecified: Secondary | ICD-10-CM | POA: Diagnosis present

## 2014-02-08 DIAGNOSIS — I5022 Chronic systolic (congestive) heart failure: Secondary | ICD-10-CM

## 2014-02-08 DIAGNOSIS — J90 Pleural effusion, not elsewhere classified: Secondary | ICD-10-CM | POA: Diagnosis present

## 2014-02-08 DIAGNOSIS — Z7902 Long term (current) use of antithrombotics/antiplatelets: Secondary | ICD-10-CM

## 2014-02-08 DIAGNOSIS — R0602 Shortness of breath: Secondary | ICD-10-CM

## 2014-02-08 DIAGNOSIS — IMO0002 Reserved for concepts with insufficient information to code with codable children: Secondary | ICD-10-CM | POA: Diagnosis present

## 2014-02-08 DIAGNOSIS — D62 Acute posthemorrhagic anemia: Secondary | ICD-10-CM

## 2014-02-08 DIAGNOSIS — Z8249 Family history of ischemic heart disease and other diseases of the circulatory system: Secondary | ICD-10-CM

## 2014-02-08 DIAGNOSIS — R092 Respiratory arrest: Secondary | ICD-10-CM | POA: Diagnosis present

## 2014-02-08 DIAGNOSIS — Z7982 Long term (current) use of aspirin: Secondary | ICD-10-CM | POA: Diagnosis not present

## 2014-02-08 DIAGNOSIS — Y840 Cardiac catheterization as the cause of abnormal reaction of the patient, or of later complication, without mention of misadventure at the time of the procedure: Secondary | ICD-10-CM | POA: Diagnosis present

## 2014-02-08 DIAGNOSIS — D649 Anemia, unspecified: Secondary | ICD-10-CM | POA: Diagnosis present

## 2014-02-08 DIAGNOSIS — Z79899 Other long term (current) drug therapy: Secondary | ICD-10-CM

## 2014-02-08 DIAGNOSIS — Z95811 Presence of heart assist device: Secondary | ICD-10-CM

## 2014-02-08 DIAGNOSIS — J942 Hemothorax: Secondary | ICD-10-CM | POA: Diagnosis present

## 2014-02-08 DIAGNOSIS — R042 Hemoptysis: Secondary | ICD-10-CM

## 2014-02-08 HISTORY — PX: RIGHT HEART CATHETERIZATION: SHX5447

## 2014-02-08 LAB — CBC WITH DIFFERENTIAL/PLATELET
Basophils Absolute: 0 10*3/uL (ref 0.0–0.1)
Basophils Relative: 0 % (ref 0–1)
Eosinophils Absolute: 0.1 10*3/uL (ref 0.0–0.7)
Eosinophils Relative: 1 % (ref 0–5)
HCT: 30.7 % — ABNORMAL LOW (ref 39.0–52.0)
Hemoglobin: 9.7 g/dL — ABNORMAL LOW (ref 13.0–17.0)
Lymphocytes Relative: 7 % — ABNORMAL LOW (ref 12–46)
Lymphs Abs: 0.6 10*3/uL — ABNORMAL LOW (ref 0.7–4.0)
MCH: 28 pg (ref 26.0–34.0)
MCHC: 31.6 g/dL (ref 30.0–36.0)
MCV: 88.7 fL (ref 78.0–100.0)
Monocytes Absolute: 0.4 10*3/uL (ref 0.1–1.0)
Monocytes Relative: 5 % (ref 3–12)
Neutro Abs: 6.7 10*3/uL (ref 1.7–7.7)
Neutrophils Relative %: 87 % — ABNORMAL HIGH (ref 43–77)
Platelets: 172 10*3/uL (ref 150–400)
RBC: 3.46 MIL/uL — ABNORMAL LOW (ref 4.22–5.81)
RDW: 18.8 % — ABNORMAL HIGH (ref 11.5–15.5)
WBC: 7.7 10*3/uL (ref 4.0–10.5)

## 2014-02-08 LAB — CBC
HCT: 27.9 % — ABNORMAL LOW (ref 39.0–52.0)
Hemoglobin: 8.7 g/dL — ABNORMAL LOW (ref 13.0–17.0)
MCH: 27.4 pg (ref 26.0–34.0)
MCHC: 31.2 g/dL (ref 30.0–36.0)
MCV: 87.7 fL (ref 78.0–100.0)
Platelets: 167 10*3/uL (ref 150–400)
RBC: 3.18 MIL/uL — ABNORMAL LOW (ref 4.22–5.81)
RDW: 18.6 % — ABNORMAL HIGH (ref 11.5–15.5)
WBC: 5.7 10*3/uL (ref 4.0–10.5)

## 2014-02-08 LAB — POCT I-STAT 3, ART BLOOD GAS (G3+)
Acid-Base Excess: 1 mmol/L (ref 0.0–2.0)
Bicarbonate: 25.3 mEq/L — ABNORMAL HIGH (ref 20.0–24.0)
O2 Saturation: 100 %
Patient temperature: 97.6
TCO2: 27 mmol/L (ref 0–100)
pCO2 arterial: 38.8 mmHg (ref 35.0–45.0)
pH, Arterial: 7.421 (ref 7.350–7.450)
pO2, Arterial: 180 mmHg — ABNORMAL HIGH (ref 80.0–100.0)

## 2014-02-08 LAB — BASIC METABOLIC PANEL
BUN: 19 mg/dL (ref 6–23)
CO2: 22 mEq/L (ref 19–32)
Calcium: 8.5 mg/dL (ref 8.4–10.5)
Chloride: 101 mEq/L (ref 96–112)
Creatinine, Ser: 1.28 mg/dL (ref 0.50–1.35)
GFR calc Af Amer: 64 mL/min — ABNORMAL LOW (ref >90)
GFR calc non Af Amer: 55 mL/min — ABNORMAL LOW (ref >90)
Glucose, Bld: 137 mg/dL — ABNORMAL HIGH (ref 70–99)
Potassium: 2.8 mEq/L — CL (ref 3.7–5.3)
Sodium: 141 mEq/L (ref 137–147)

## 2014-02-08 LAB — GLUCOSE, CAPILLARY
Glucose-Capillary: 85 mg/dL (ref 70–99)
Glucose-Capillary: 96 mg/dL (ref 70–99)

## 2014-02-08 LAB — BLOOD PRODUCT ORDER (VERBAL) VERIFICATION

## 2014-02-08 LAB — PROTIME-INR
INR: 2.85 — ABNORMAL HIGH (ref 0.00–1.49)
Prothrombin Time: 28.9 seconds — ABNORMAL HIGH (ref 11.6–15.2)

## 2014-02-08 LAB — PREPARE RBC (CROSSMATCH)

## 2014-02-08 LAB — MRSA PCR SCREENING: MRSA by PCR: NEGATIVE

## 2014-02-08 SURGERY — RIGHT HEART CATH
Anesthesia: LOCAL

## 2014-02-08 MED ORDER — PROPOFOL INFUSION 10 MG/ML OPTIME
INTRAVENOUS | Status: DC | PRN
  Administered 2014-02-08: 50 ug/kg/min via INTRAVENOUS

## 2014-02-08 MED ORDER — VANCOMYCIN HCL IN DEXTROSE 1-5 GM/200ML-% IV SOLN
1000.0000 mg | Freq: Once | INTRAVENOUS | Status: AC
  Administered 2014-02-08: 1000 mg via INTRAVENOUS
  Filled 2014-02-08: qty 200

## 2014-02-08 MED ORDER — POTASSIUM CHLORIDE 20 MEQ/15ML (10%) PO LIQD
40.0000 meq | Freq: Two times a day (BID) | ORAL | Status: AC
  Administered 2014-02-08 (×2): 40 meq
  Filled 2014-02-08 (×2): qty 30

## 2014-02-08 MED ORDER — LIDOCAINE HCL (PF) 1 % IJ SOLN
INTRAMUSCULAR | Status: AC
  Filled 2014-02-08: qty 30

## 2014-02-08 MED ORDER — SODIUM CHLORIDE 0.9 % IV SOLN: 250.0000 mL | INTRAVENOUS | Status: DC | PRN

## 2014-02-08 MED ORDER — MIDAZOLAM HCL 2 MG/2ML IJ SOLN
INTRAMUSCULAR | Status: AC
  Filled 2014-02-08: qty 2

## 2014-02-08 MED ORDER — ASPIRIN 81 MG PO CHEW: 81.0000 mg | CHEWABLE_TABLET | ORAL | Status: DC

## 2014-02-08 MED ORDER — HYDRALAZINE HCL 20 MG/ML IJ SOLN
10.0000 mg | INTRAMUSCULAR | Status: DC | PRN
  Administered 2014-02-08: 10 mg via INTRAVENOUS
  Filled 2014-02-08: qty 1

## 2014-02-08 MED ORDER — SODIUM CHLORIDE 0.9 % IV SOLN
INTRAVENOUS | Status: DC
  Administered 2014-02-08: 07:00:00 via INTRAVENOUS

## 2014-02-08 MED ORDER — VANCOMYCIN HCL 500 MG IV SOLR
500.0000 mg | Freq: Two times a day (BID) | INTRAVENOUS | Status: AC
  Administered 2014-02-09 – 2014-02-10 (×5): 500 mg via INTRAVENOUS
  Filled 2014-02-08 (×5): qty 500

## 2014-02-08 MED ORDER — SUCCINYLCHOLINE CHLORIDE 20 MG/ML IJ SOLN
INTRAMUSCULAR | Status: AC | PRN
  Administered 2014-02-08: 100 mg via INTRAVENOUS

## 2014-02-08 MED ORDER — SODIUM CHLORIDE 0.9 % IJ SOLN: 3.0000 mL | Freq: Two times a day (BID) | INTRAMUSCULAR | Status: DC

## 2014-02-08 MED ORDER — HEPARIN (PORCINE) IN NACL 2-0.9 UNIT/ML-% IJ SOLN
INTRAMUSCULAR | Status: AC
  Filled 2014-02-08: qty 500

## 2014-02-08 MED ORDER — FENTANYL CITRATE 0.05 MG/ML IJ SOLN: 50.0000 ug | INTRAMUSCULAR | Status: DC | PRN

## 2014-02-08 MED ORDER — ALBUTEROL SULFATE (2.5 MG/3ML) 0.083% IN NEBU: 2.5000 mg | INHALATION_SOLUTION | RESPIRATORY_TRACT | Status: DC | PRN

## 2014-02-08 MED ORDER — BIOTENE DRY MOUTH MT LIQD
15.0000 mL | Freq: Four times a day (QID) | OROMUCOSAL | Status: DC
  Administered 2014-02-08 – 2014-02-11 (×9): 15 mL via OROMUCOSAL

## 2014-02-08 MED ORDER — ROCURONIUM BROMIDE 100 MG/10ML IV SOLN
INTRAVENOUS | Status: AC | PRN
  Administered 2014-02-08: 50 mg via INTRAVENOUS

## 2014-02-08 MED ORDER — PANTOPRAZOLE SODIUM 40 MG IV SOLR
40.0000 mg | INTRAVENOUS | Status: DC
  Administered 2014-02-08 – 2014-02-09 (×2): 40 mg via INTRAVENOUS
  Filled 2014-02-08 (×2): qty 40

## 2014-02-08 MED ORDER — POTASSIUM CHLORIDE 20 MEQ/15ML (10%) PO LIQD
40.0000 meq | Freq: Two times a day (BID) | ORAL | Status: DC
Start: 2014-02-08 — End: 2014-02-08
  Filled 2014-02-08 (×2): qty 30

## 2014-02-08 MED ORDER — LEVOTHYROXINE SODIUM 175 MCG PO TABS
175.0000 ug | ORAL_TABLET | Freq: Every day | ORAL | Status: DC
  Administered 2014-02-10 – 2014-02-12 (×3): 175 ug via ORAL
  Filled 2014-02-08 (×5): qty 1

## 2014-02-08 MED ORDER — ETOMIDATE 2 MG/ML IV SOLN
INTRAVENOUS | Status: AC | PRN
  Administered 2014-02-08: 20 mg via INTRAVENOUS

## 2014-02-08 MED ORDER — DEXTROSE-NACL 5-0.9 % IV SOLN
INTRAVENOUS | Status: DC
  Administered 2014-02-08: 10 mL/h via INTRAVENOUS

## 2014-02-08 MED ORDER — PROPOFOL 10 MG/ML IV EMUL
5.0000 ug/kg/min | INTRAVENOUS | Status: DC
  Administered 2014-02-08 (×2): 50 ug/kg/min via INTRAVENOUS
  Filled 2014-02-08 (×4): qty 100

## 2014-02-08 MED ORDER — SODIUM CHLORIDE 0.9 % IV SOLN
INTRAVENOUS | Status: DC
  Administered 2014-02-08: 10 mL/h via INTRAVENOUS

## 2014-02-08 MED ORDER — FENTANYL CITRATE 0.05 MG/ML IJ SOLN
50.0000 ug | INTRAMUSCULAR | Status: DC | PRN
  Administered 2014-02-08: 50 ug via INTRAVENOUS
  Filled 2014-02-08 (×2): qty 2

## 2014-02-08 MED ORDER — DEXTROSE 5 % IV SOLN
1.0000 g | Freq: Two times a day (BID) | INTRAVENOUS | Status: AC
  Administered 2014-02-08 – 2014-02-11 (×7): 1 g via INTRAVENOUS
  Filled 2014-02-08 (×7): qty 1

## 2014-02-08 MED ORDER — CHLORHEXIDINE GLUCONATE 0.12 % MT SOLN
15.0000 mL | Freq: Two times a day (BID) | OROMUCOSAL | Status: DC
  Administered 2014-02-08 – 2014-02-11 (×7): 15 mL via OROMUCOSAL
  Filled 2014-02-08 (×7): qty 15

## 2014-02-08 MED ORDER — RACEPINEPHRINE HCL 2.25 % IN NEBU
0.2500 mL | INHALATION_SOLUTION | Freq: Once | RESPIRATORY_TRACT | Status: AC
  Administered 2014-02-08: 0.25 mL via RESPIRATORY_TRACT

## 2014-02-08 MED ORDER — SODIUM CHLORIDE 0.9 % IJ SOLN: 3.0000 mL | INTRAMUSCULAR | Status: DC | PRN

## 2014-02-08 MED ORDER — FENTANYL CITRATE 0.05 MG/ML IJ SOLN: 25.0000 ug | INTRAMUSCULAR | Status: DC | PRN

## 2014-02-08 MED ORDER — FENTANYL CITRATE 0.05 MG/ML IJ SOLN
INTRAMUSCULAR | Status: AC
  Filled 2014-02-08: qty 2

## 2014-02-08 NOTE — H&P (Signed)
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   Admitted 3/4-3/25/15 with SOB and syncopal episode. Found to have SBO and NG placed and started on TPN. On 11/06/13 developed progressive SOB and hypotension and co-ox 51% and Hgb 6.7. Started on milrinone and transfused. ECHo showed nl RV function. Patient had PEA arrest and was intubated and started on pressors. CXR revelaed a R hemothorax and a R CT placed. He required VATs and evacuation of hemothorax and then tansferred to CIR.   Patient saw Dr. Rayann Heman 01/24/14 for device follow up. Per note: pt is stable and on an appropriate medical regimen. Normal ICD function. R waves have decreased since LVAD placement; CXR revealed stable lead position. He plans to follow clinically and try to avoid lead revision if possible  Plan RHC today for transplant workup. Patient was stuck under [place] and then developed hemoptysis. Intubated. MAP 140.  LVAD INTERROGATION:  HeartMate II LVAD:  Flow 4.5 liters/min, speed 9000, power 5.0, PI 7.5.     Review of Systems: [y] = yes, [ ]  = no   General: Weight gain [ ] ; Weight loss [ ] ; Anorexia [ ] ; Fatigue [ ] ; Fever [ ] ; Chills [ ] ; Weakness [ ]   Cardiac: Chest pain/pressure [ ] ; Resting SOB [ ] ; Exertional SOB [ ] ; Orthopnea [ ] ; Pedal Edema [ ] ; Palpitations [ ] ; Syncope [ ] ; Presyncope [ ] ; Paroxysmal nocturnal dyspnea[ ]   Pulmonary: Cough [ ] ; Wheezing[ ] ; Hemoptysis[ Y]; Sputum [ ] ; Snoring [ ]   GI: Vomiting[ ] ; Dysphagia[ ] ; Melena[ ] ; Hematochezia [ ] ; Heartburn[ ] ; Abdominal pain [ ] ; Constipation [ ] ; Diarrhea [ ] ; BRBPR [ ]   GU: Hematuria[ ] ; Dysuria [ ] ; Nocturia[ ]   Vascular: Pain in legs with walking [ ] ; Pain in feet with lying flat [ ] ; Non-healing sores [ ] ; Stroke [ ] ; TIA [ ] ; Slurred speech [ ] ;   Neuro: Headaches[ ] ; Vertigo[ ] ; Seizures[ ] ; Paresthesias[ ] ;Blurred vision [ ] ; Diplopia [ ] ; Vision changes [ ]   Ortho/Skin: Arthritis [ ] ; Joint pain [ ] ; Muscle pain [ ] ; Joint swelling [ ] ; Back Pain [ ] ; Rash [ ]   Psych: Depression[ ] ; Anxiety[ ]   Heme: Bleeding problems [ ] ; Clotting disorders [ ] ; Anemia [ ]   Endocrine: Diabetes [ ] ; Thyroid dysfunction[ ]   Home Medications Prior to Admission medications   Medication Sig Start Date End Date Taking? Authorizing Provider  aspirin EC 325 MG tablet Take 325 mg by mouth daily.   Yes Historical Provider, MD  citalopram (CELEXA) 10 MG tablet Take 1 tablet (10 mg total) by mouth daily. 01/16/14  Yes Shaune Pascal Bensimhon, MD  Coenzyme Q10 (COQ10) 200 MG CAPS Take 200 mg by mouth daily.   Yes Historical Provider, MD  ezetimibe (ZETIA) 10 MG tablet Take 1 tablet (10 mg total) by mouth daily. 01/31/14  Yes Jolaine Artist, MD  ferrous [id number] C-folic acid (TRINSICON / FOLTRIN) capsule Take 1 capsule by mouth daily.  11/22/13  Yes Daniel J Angiulli, PA-C  furosemide (LASIX) 20 MG tablet Take 20 mg by mouth daily as needed (weight gain for 3 pounds in a day or 5 pounds in a week).    Yes Historical Provider, MD  hydrALAZINE (APRESOLINE) 25 MG tablet Take 0.5 tablets (12.5  mg total) by mouth 3 (three) times daily. 02/05/14  Yes Amy D Clegg, NP  levothyroxine (SYNTHROID) 175 MCG tablet Take 1 tablet (175 mcg total) by mouth daily before breakfast. 12/15/13  Yes Elayne Snare, MD  Omega-3 300 MG CAPS Take 300 mg by mouth at bedtime.    Yes Historical Provider, MD  pantoprazole (PROTONIX) 40 MG tablet Take 1 tablet (40 mg total) by mouth daily. 11/22/13  Yes Daniel J Angiulli, PA-C  rosuvastatin (CRESTOR) 20 MG tablet Take 1 tablet (20 mg total) by mouth daily. 01/31/14  Yes Shaune Pascal Bensimhon, MD  temazepam (RESTORIL) 15 MG capsule Take 1 capsule (15 mg total) by mouth at bedtime. 12/20/13  Yes Ivin Poot, MD  warfarin (COUMADIN) 5 MG tablet Take  5 mg by mouth daily.    Yes Historical Provider, MD    Past Medical History: Past Medical History  Diagnosis Date  . Ischemic cardiomyopathy     a. 10/09 Echo: Sev LV Dysfxn, inf/lat AK, Mod MR.  [name];  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 Hypertension   . PVC (premature ventricular contraction)   . Pacemaker   . CHF (congestive heart failure)   . Shortness of breath   . Automatic implantable cardioverter-defibrillator in situ     Past Surgical History: Past Surgical History  Procedure Laterality Date  . Defibrillator  2009  . Total knee arthroplasty  11/27/11    left  . Insert / replace / remove pacemaker  2009  . Coronary angioplasty with stent placement  2004  . Coronary angioplasty with stent placement  07/2008  . Knee arthroscopy w/ partial medial meniscectomy  12/2002    left  . Knee arthroscopy      bilaterally  . Total knee arthroplasty  11/27/2011    Procedure: TOTAL KNEE ARTHROPLASTY;  Surgeon: Yvette Rack., MD;  Location: [place];  Service: Orthopedics;  Laterality: Left;  left total knee arthroplasty  . Esophagogastroduodenoscopy N/A 09/15/2013    Procedure: ESOPHAGOGASTRODUODENOSCOPY (EGD);  Surgeon: Ladene Artist, MD;  Location: [facility] ENDOSCOPY;  Service: Endoscopy;  Laterality: N/A;  bedside  . Insertion of implantable left ventricular assist device N/A 09/18/2013    Procedure: INSERTION OF IMPLANTABLE LEFT VENTRICULAR ASSIST DEVICE;  Surgeon: Ivin Poot, MD;  Location: [place];  Service: Open Heart Surgery;  Laterality: N/A;  CIRC ARREST  NITRIC OXIDE  . Intraoperative transesophageal echocardiogram N/A 09/18/2013     Procedure: INTRAOPERATIVE TRANSESOPHAGEAL ECHOCARDIOGRAM;  Surgeon: Ivin Poot, MD;  Location: [place];  Service: Open Heart Surgery;  Laterality: N/A;  . Video assisted thoracoscopy Right 11/06/2013    Procedure: VIDEO ASSISTED THORACOSCOPY;  Surgeon: Gaye Pollack, MD;  [facility];  Service: Cardiothoracic;  Laterality: Right;  . Hematoma evacuation Right 11/06/2013    Procedure: EVACUATION HEMATOMA;  Surgeon: Gaye Pollack, MD;  Location: [facility];  Service: Cardiothoracic;  Laterality: Right;    Family History: Family History  Problem Relation Age of Onset  . Heart  failure Father   . Stroke Father   . Coronary artery disease Mother   . Heart disease Mother   . Hyperlipidemia Sister     Social History: History   Social History  . Marital Status: Married    Spouse Name: N/A    Number of Children: 1  . Years of Education: N/A   Occupational History  . referee   . cleaning company    Social History Main Topics  . Smoking status: Never Smoker   . Smokeless tobacco: Never Used  . Alcohol Use: Yes     Comment: "occassionlly; last beer was 09/2011"  . Drug Use: No     Comment: "back in our younger days we smoked a little pot"  . Sexual Activity: Not Currently   Other Topics Concern  . Not on file   Social History Narrative  . No narrative on file    Allergies:  Allergies  Allergen Reactions  . Ace Inhibitors     Hypotension and elevated creatinine  . Diovan [Valsartan] Other (See Comments)    Hypotension at low dose, hyperkalemia  . Lipitor [Atorvastatin Calcium] Other (See Comments)    Muscle pain  . Norvasc [Amlodipine] Other (See Comments)    Put patient to sleep    Objective:    Vital Signs:   Temp:  [97.6 F (36.4 C)] 97.6 F (36.4 C) (06/11 0607) Pulse Rate:  [92] 92 (06/11 0607) Resp:  [20] 20 (06/11 0607) BP: (130-131)/(106-107) 130/106 mmHg (06/11 0611) SpO2:  [100 %] 100 % (06/11 0607) FiO2 (%):  [100 %] 100 % (06/11 0921) Weight:  [150 lb  (68.04 kg)] 150 lb (68.04 kg) (06/11 0607)   Filed Weights   02/08/14 0607  Weight: 150 lb (68.04 kg)    Mean arterial Pressure 100-120  Physical Exam: General:  Well appearing. Intubated HEENT: normal Neck: supple. JVP flat. Carotids 2+ bilat; no bruits. No lymphadenopathy or thryomegaly appreciated. Cor: Mechanical heart sounds with LVAD hum present. Lungs: clear Abdomen: soft, nontender, nondistended. No hepatosplenomegaly. No bruits or masses. Good bowel sounds. Driveline: C/D/I; securement device intact and driveline incorporated Extremities: no cyanosis, clubbing, rash, edema Neuro: alert & orientedx3, cranial nerves grossly intact. moves all 4 extremities w/o difficulty. Affect pleasant  Telemetry: ST 115  Labs: Basic Metabolic Panel:  Recent Labs Lab 02/05/14 0832  NA 143  K 3.8  CL 102  CO2 29  GLUCOSE 94  BUN 13  CREATININE 1.38*  CALCIUM 9.4    Liver Function Tests:  Recent Labs Lab 02/05/14 0832  AST 43*  ALT 75*  ALKPHOS 171*  BILITOT 1.2  PROT 7.9  ALBUMIN 3.7   No results found for this basename: LIPASE, AMYLASE,  in the last 168 hours No results found for this basename: AMMONIA,  in the last 168 hours  CBC:  Recent Labs Lab 02/05/14 0832  WBC 7.5  HGB 12.7*  HCT 40.0  MCV 87.1  PLT 234    Cardiac Enzymes: No results found for this basename: CKTOTAL, CKMB, CKMBINDEX, TROPONINI,  in the last 168 hours  BNP: BNP (last 3 results)  Recent Labs  01/08/14 1349 01/29/14 1000 02/05/14 0832  PROBNP 625.4* 3360.0* 5665.0*    CBG: No results found for this basename: GLUCAP,  in the last 168 hours  Coagulation Studies:  Recent Labs  02/08/14 0647  LABPROT 28.9*  INR 2.85*    Imaging: Dg Chest Port 1 View  02/08/2014   CLINICAL DATA:  Intubation.  Hemoptysis  EXAM: PORTABLE CHEST - 1 VIEW  COMPARISON:  11/21/2013  FINDINGS: Endotracheal tube at the carina directed towards the right main bronchus. Recommend withdrawal 3 cm.   Right upper lobe infiltrate may represent hemorrhage or edema. Mild vascular congestion is present. Pleural thickening bilaterally unchanged. Left ventricular assist device noted. AICD noted.  IMPRESSION: Endotracheal to in the proximal right main bronchus. Recommend withdrawal 3 cm.  Pulmonary vascular congestion. Right upper lobe infiltrate may represent hemorrhage given the history of hemoptysis. Edema also possible.  These results were called by telephone at the time of interpretation on 02/08/2014 at 9:34 AM to Dr. Haroldine Laws, who verbally acknowledged these results.   Electronically Signed   By: Franchot Gallo M.D.   On: 02/08/2014 09:35         Assessment:   1) Hemothorax 2) Acute respiratory failure - intubated 3) chronic systolic HF - s/p LVAD  4) HTN   Plan/Discussion:    Mr. Beville is known to the HF team and presented today for schedule RHC to assess hemodynamics for transplant evaluation.   Ultrasound was used on RIJ however patient started coughing and developed hemoptysis. Concern for hemothorax and PVT called to bedside. CT attempted but no evidence of bleeding. Pt intubated and started on diprivan. No pneumothorax but concern for hemothorax. CXR with pulmonary hemorrhage and ETT tube too advacned.  Will admit. Type screen and give 2FFP. Will continue diprivan and if needed will use nitro gtt for HTN. Will repeat CXR.   I reviewed the LVAD parameters from today, and compared the results to the patient's prior recorded data.  No programming changes were made.  The LVAD is functioning within specified parameters.  The patient performs LVAD self-test daily.  LVAD interrogation was negative for any significant power changes, alarms or PI events/speed drops.  LVAD equipment check completed and is in good working order.  Back-up equipment present.   LVAD education done on emergency procedures and precautions and reviewed exit site care.  Length of Stay: 0  Rande Brunt 02/08/2014, 9:38 AM  VAD Team Pager [phone] (7am - 7am) +++VAD ISSUES ONLY+++   Advanced Heart Failure Team Pager [phone] (M-F; 7a - 4p)  Please contact [facility] for night-coverage after hours (4p -7a ) and weekends on [email or web address] for all non- LVAD Issues  Patient seen and examined with Junie Bame, NP. We discussed all aspects of the encounter. I agree with the assessment and plan as stated above.   Developed hemoptysis during access for RHC today. Emergent attempt at chest tube placement revealed no pleural blood. Suspect intraparenchymal bleed. Patient intubated in cath lab and arterial line placed. BP remains stable. Given 2 units FFP emergently. Transferred to CCU. F/u CXR remains stable.   Will hold coumadin. Follow serial CXRs and CBCs. Hoepfully extubate in am. Appreciate help from Dr. Prescott Gum, CCM, Dr. Linna Caprice and [facility] team.   The patient is critically ill with multiple organ systems failure and requires high complexity decision making for assessment and support, frequent evaluation and titration of therapies, application of advanced monitoring technologies and extensive interpretation of multiple databases.   Critical Care Time personally devoted to patient care services described in this note is 60 Minutes.  Daniel Bensimhon,MD 8:45 PM

## 2014-02-08 NOTE — Consult Note (Signed)
PULMONARY / CRITICAL CARE MEDICINE   Name: Johnny Oneill MRN: [id number] DOB: [birth year]    ADMISSION DATE:  02/08/2014 CONSULTATION DATE:  02/08/14  REFERRING MD :  Dr. Haroldine Laws  PRIMARY SERVICE: Cardiology  CHIEF COMPLAINT:  Hemoptysis, Acute Respiratory Failure  BRIEF PATIENT DESCRIPTION: 73 y/o M with PMH of sCHF, cardiomyopathy who is s/p LVAD placement 09/19/13 who was admitted for [place] on 6/11 to evaluate for potential transplant.  Developed hemoptysis during procedure and hypoxemia requiring intubation.  Of note ,  PEA arrest with R hemothorax requiring R Chest tube during last admission in march 2015 SIGNIFICANT EVENTS: 6/11 - Admit for RHC to eval for transplant list.   Developed hemoptysis during procedure and hypoxemia requiring intubation.   STUDIES:   LINES / TUBES: OETT 6/11>>>  CULTURES:   ANTIBIOTICS: Ceftaz 6/11 >>> Vanco 6/11 >>>  HISTORY OF PRESENT ILLNESS:  73 y/o with PMH of Anxiety, Hypothyroidism, HTN, CAD, CHF / ICM s/p LVAD placement in January of 2015 who was admitted on 6/11 for planned RHC to evaluate status for potential transplant at [facility].    Patient had a recent admission in March of 2015 for SOB & syncopal episode.  Course was complicated by SBO requiring TPN, progressive SOB/Hypotension, PEA arrest with R hemothorax s/p R Chest tube.  Patient required VATS evacuation of hemothorax and was transferred to CIR.    During initial phases of RHC, patient was stuck in R IJ for procedure under ultrasound.  He developed cough with hemoptysis & hypoxemia requiring intubation.  Attempt at chest tube (CVTS) was placed on R for possible hemothorax but was unable to place tube (lung was up).  Patient treated with FFP and returned to ICU.  Prior to procedure, patient was hypertensive and was treated with hydralazine.  BP improved post intubation / sedation.    PCCM consulted for further evaluation.     PAST MEDICAL HISTORY :  Past Medical History  Diagnosis  Date  . Ischemic cardiomyopathy     a. 10/09 Echo: Sev LV Dysfxn, inf/lat AK, Mod MR.  [name];  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 Hypertension   . PVC (premature ventricular contraction)   . Pacemaker   . CHF (congestive heart failure)   . Shortness of breath   . Automatic implantable cardioverter-defibrillator in situ    Past Surgical History  Procedure Laterality Date  . Defibrillator  2009  . Total knee arthroplasty  11/27/11    left  . Insert / replace / remove pacemaker  2009  . Coronary angioplasty with stent placement  2004  . Coronary angioplasty with stent placement  07/2008  . Knee arthroscopy w/ partial medial meniscectomy  12/2002    left  . Knee arthroscopy      bilaterally  . Total knee arthroplasty  11/27/2011    Procedure: TOTAL KNEE ARTHROPLASTY;  Surgeon: Yvette Rack., MD;  Location: [place];  Service: Orthopedics;  Laterality: Left;  left total knee arthroplasty  . Esophagogastroduodenoscopy N/A  09/15/2013    Procedure: ESOPHAGOGASTRODUODENOSCOPY (EGD);  Surgeon: Ladene Artist, MD;  Location: [facility] ENDOSCOPY;  Service: Endoscopy;  Laterality: N/A;  bedside  . Insertion of implantable left ventricular assist device N/A 09/18/2013    Procedure: INSERTION OF IMPLANTABLE LEFT VENTRICULAR ASSIST DEVICE;  Surgeon: Ivin Poot, MD;  Location: [place];  Service: Open Heart Surgery;  Laterality: N/A;  CIRC ARREST  NITRIC OXIDE  . Intraoperative transesophageal echocardiogram N/A 09/18/2013    Procedure: INTRAOPERATIVE TRANSESOPHAGEAL ECHOCARDIOGRAM;  Surgeon: Ivin Poot, MD;  Location: [place];  Service: Open Heart Surgery;   Laterality: N/A;  . Video assisted thoracoscopy Right 11/06/2013    Procedure: VIDEO ASSISTED THORACOSCOPY;  Surgeon: Gaye Pollack, MD;  Location: [facility];  Service: Cardiothoracic;  Laterality: Right;  . Hematoma evacuation Right 11/06/2013    Procedure: EVACUATION HEMATOMA;  Surgeon: Gaye Pollack, MD;  Location: [place];  Service: Cardiothoracic;  Laterality: Right;   Prior to Admission medications   Medication Sig Start Date End Date Taking? Authorizing Provider  aspirin EC 325 MG tablet Take 325 mg by mouth daily.   Yes Historical Provider, MD  citalopram (CELEXA) 10 MG tablet Take 1 tablet (10 mg total) by mouth daily. 01/16/14  Yes Shaune Pascal Bensimhon, MD  Coenzyme Q10 (COQ10) 200 MG CAPS Take 200 mg by mouth daily.   Yes Historical Provider, MD  ezetimibe (ZETIA) 10 MG tablet Take 1 tablet (10 mg total) by mouth daily. 01/31/14  Yes Jolaine Artist, MD  ferrous Q000111Q C-folic acid (TRINSICON / FOLTRIN) capsule Take 1 capsule by mouth daily.  11/22/13  Yes Daniel J Angiulli, PA-C  furosemide (LASIX) 20 MG tablet Take 20 mg by mouth daily as needed (weight gain for 3 pounds in a day or 5 pounds in a week).    Yes Historical Provider, MD  hydrALAZINE (APRESOLINE) 25 MG tablet Take 0.5 tablets (12.5 mg total) by mouth 3 (three) times daily. 02/05/14  Yes Amy D Clegg, NP  levothyroxine (SYNTHROID) 175 MCG tablet Take 1 tablet (175 mcg total) by mouth daily before breakfast. 12/15/13  Yes Elayne Snare, MD  Omega-3 300 MG CAPS Take 300 mg by mouth at bedtime.    Yes Historical Provider, MD  pantoprazole (PROTONIX) 40 MG tablet Take 1 tablet (40 mg total) by mouth daily. 11/22/13  Yes Daniel J Angiulli, PA-C  rosuvastatin (CRESTOR) 20 MG tablet Take 1 tablet (20 mg total) by mouth daily. 01/31/14  Yes Shaune Pascal Bensimhon, MD  temazepam (RESTORIL) 15 MG capsule Take 1 capsule (15 mg total) by mouth at bedtime. 12/20/13  Yes Ivin Poot, MD  warfarin (COUMADIN) 5 MG tablet Take 5 mg by mouth  daily.    Yes Historical Provider, MD   Allergies  Allergen Reactions  . Ace Inhibitors     Hypotension and elevated creatinine  . Diovan [Valsartan] Other (See Comments)    Hypotension at low dose, hyperkalemia  . Lipitor [Atorvastatin Calcium] Other (See Comments)    Muscle pain  . Norvasc [Amlodipine] Other (See Comments)    Put patient to sleep    FAMILY HISTORY:  Family History  Problem Relation Age of Onset  . Heart failure Father   . Stroke Father   . Coronary artery disease Mother   . Heart disease Mother   . Hyperlipidemia Sister    SOCIAL HISTORY:  reports that he has never smoked. He has never used smokeless tobacco. He reports that he drinks alcohol. He reports  that he does not use illicit drugs.  REVIEW OF SYSTEMS:  Unable to complete as patient is altered on vent.   SUBJECTIVE:   VITAL SIGNS: Temp:  [97.6 F (36.4 C)] 97.6 F (36.4 C) (06/11 0607) Pulse Rate:  [84-92] 84 (06/11 0816) Resp:  [20] 20 (06/11 0607) BP: (130-131)/(106-107) 130/106 mmHg (06/11 0611) SpO2:  [100 %] 100 % (06/11 0915) FiO2 (%):  [100 %] 100 % (06/11 0921) Weight:  [150 lb (68.04 kg)] 150 lb (68.04 kg) (06/11 0607)  HEMODYNAMICS:    VENTILATOR SETTINGS: Vent Mode:  [-] PRVC FiO2 (%):  [100 %] 100 % Set Rate:  [22 bmp] 22 bmp Vt Set:  [530 mL] 530 mL PEEP:  [5 cmH20] 5 cmH20 Plateau Pressure:  [22 cmH20] 22 cmH20  INTAKE / OUTPUT: Intake/Output   None     PHYSICAL EXAMINATION: General:  wdwn adult male in NAD  Neuro:  Sedated on vent, responds to verbal stimuli HEENT:  OETT, mm pink/moist, no jvd.  Small amt bloody secretions from ETT with in-line ballard sxn Cardiovascular:  LVAD hum noted, unable to discern s1s2 Lungs:  resp's even/non-labored on vent, lungs bilaterally clear Abdomen:  Round/soft, bsx4 active, OGT inserted with small amt bloody secretions returned Musculoskeletal:  No acute deformities  Skin:  Warm/dry, no edema   LABS:  CBC  Recent  Labs Lab 02/05/14 0832  WBC 7.5  HGB 12.7*  HCT 40.0  PLT 234   Coag's  Recent Labs Lab 02/05/14 0832 02/08/14 0647  INR 3.43* 2.85*   BMET  Recent Labs Lab 02/05/14 0832  NA 143  K 3.8  CL 102  CO2 29  BUN 13  CREATININE 1.38*  GLUCOSE 94   Electrolytes  Recent Labs Lab 02/05/14 0832  CALCIUM 9.4   Sepsis Markers No results found for this basename: LATICACIDVEN, PROCALCITON, O2SATVEN,  in the last 168 hours  ABG No results found for this basename: PHART, PCO2ART, PO2ART,  in the last 168 hours Liver Enzymes  Recent Labs Lab 02/05/14 0832  AST 43*  ALT 75*  ALKPHOS 171*  BILITOT 1.2  ALBUMIN 3.7   Cardiac Enzymes  Recent Labs Lab 02/05/14 0832  PROBNP 5665.0*   Glucose No results found for this basename: GLUCAP,  in the last 168 hours  Imaging Dg Chest Port 1 View  02/08/2014   CLINICAL DATA:  Intubation.  Hemoptysis  EXAM: PORTABLE CHEST - 1 VIEW  COMPARISON:  11/21/2013  FINDINGS: Endotracheal tube at the carina directed towards the right main bronchus. Recommend withdrawal 3 cm.  Right upper lobe infiltrate may represent hemorrhage or edema. Mild vascular congestion is present. Pleural thickening bilaterally unchanged. Left ventricular assist device noted. AICD noted.  IMPRESSION: Endotracheal to in the proximal right main bronchus. Recommend withdrawal 3 cm.  Pulmonary vascular congestion. Right upper lobe infiltrate may represent hemorrhage given the history of hemoptysis. Edema also possible.  These results were called by telephone at the time of interpretation on 02/08/2014 at 9:34 AM to Dr. Haroldine Laws, who verbally acknowledged these results.   Electronically Signed   By: Franchot Gallo M.D.   On: 02/08/2014 09:35    ASSESSMENT / PLAN:  PULMONARY A: Acute Respiratory Failure Hemoptysis - unclear etiology, ? Airway trauma vs irritation with cough?  Not gastric source.  Hypoxemia  P:   -full vent support overnight, 8cc/kg -SBT / WUA in  am -f/u cxr in am  -PRN albuterol -s/p attempt at R CT placement 6/11, assess serial CXR's,  next due at 1700  CARDIOVASCULAR A:  CAD CHF / ICM s/p LVAD - s/p attempted RHC 6/11 Chronic Anti-Coagulation  P:  -Per Cardiology -FFP per Cardiology -bleeding has slowed, likely will be able to resume anti-coagulation in am  RENAL A:   Hypokalemia P:   -replace K, 40 mEq BID x2 doses  GASTROINTESTINAL A:   Dysphagia related to Mechanical Ventilation  P:   -place OGT, GI bleeding as source of blood ruled out -consider TF in am 6/12 if not able to extubate   HEMATOLOGIC A:   Acute on Chronic Anemia - hgb 12.7 6/8 -->9.7 6/11.   P:  -monitor H/H for further drop in Hgb -tx if <7% or further active bleeding   INFECTIOUS A:   Post CT placement attempt P:   -Abx as above, likely can d/c in am 6/12  ENDOCRINE A:   Hyperglycemia    P:   -monitor CBG's while NPO -SSI if consistently > 200  NEUROLOGIC A:   Sedation  P:   -RASS goal: 0 to -1 -Propofol gtt -PRN fentanyl for pain  Noe Gens, NP-C [facility] Pgr: [phone] or [phone]    I have personally obtained a history, examined the patient, evaluated laboratory and imaging results, formulated the assessment and plan and placed orders.  CRITICAL CARE: The patient is critically ill with multiple organ systems failure and requires high complexity decision making for assessment and support, frequent evaluation and titration of therapies, application of advanced monitoring technologies and extensive interpretation of multiple databases. Critical Care Time devoted to patient care services described in this note is 45 minutes.   Kara Mead MD. [place] Flood. [facility] Pager [phone] If no response call [phone]   02/08/2014, 11:07 AM

## 2014-02-08 NOTE — H&P (View-Only) (Signed)
Patient ID: Johnny Oneill, male   DOB: 05-01-1941, 73 y.o.   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   Admitted 3/4-3/25/15 with SOB and syncopal episode. Found to have SBO and NG placed and started on TPN. On 11/06/13 developed progressive SOB and hypotension and co-ox 51% and Hgb 6.7. Started on milrinone and transfused. ECHo showed nl RV function. Patient had PEA arrest and was intubated and started on pressors. CXR revelaed a R hemothorax and a R CT placed. He required VATs  and evacuation of hemothorax and then tansferred to CIR.   Patient saw Dr. Rayann Heman 01/24/14 for device follow up. Per note: pt is stable and on an appropriate medical regimen. Normal ICD function. R waves have decreased since LVAD placement; CXR revealed stable lead position. He plans to follow clinically and try to avoid lead revision if possible.  He returns for an acute work in due to fatigue. Last week he started norvasc 2.5 mg but had nausea and later stopped 2 days later.On 6/6 he had 4 low flow alarms.  Yesterday he took 40 mg of lasix for increased weight and dyspnea.  He  diuresed 4 pounds back to 148 pounds. Denies orthopnea/PND. He has been intolerant of ACE inhibitors and calcium channel blockers.   Denies LVAD alarms.  Denies driveline trauma, erythema or drainage.  Denies ICD shocks.    Reports taking Coumadin as prescribed and adherence to anticoagulation based dietary restrictions.  Denies bright red blood per rectum or melena, no dark urine or hematuria.     Past Medical History  Diagnosis Date  . Ischemic cardiomyopathy     a. 10/09 Echo: Sev LV Dysfxn, inf/lat AK, Mod MR.  [name];  s/p Lat MI 2009 with occlusion of  the LCX - treated with Promus stenting. Has total occlusion of the RCA.   . ICD (implantable cardiac defibrillator) in place     PROPHYLACTIC      medtronic  . RBBB   . 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 Hypertension   . PVC (premature ventricular contraction)   . Pacemaker   . CHF (congestive heart failure)   . Shortness of breath   . Automatic implantable cardioverter-defibrillator in situ     Current Outpatient Prescriptions  Medication Sig Dispense Refill  . aspirin 325 MG tablet Take 1 tablet (325 mg total) by mouth daily.      . citalopram (CELEXA) 10 MG tablet Take 1 tablet (10 mg total) by mouth daily.  30 tablet  6  . Coenzyme Q10 (COQ10) 200 MG CAPS Take 200 mg by mouth daily.      [name] ezetimibe (ZETIA) 10 MG tablet Take 1 tablet (10 mg total) by mouth daily.  90 tablet  3  . ferrous Q000111Q C-folic acid (TRINSICON / FOLTRIN) capsule Take 1 capsule by mouth daily.       . furosemide (LASIX) 20 MG tablet Take 20 mg by mouth as needed. Taking PRN wt gain      . geriatric multivitamins-minerals (ELDERTONIC/GEVRABON) ELIX       . levothyroxine (SYNTHROID) 175 MCG tablet Take 1 tablet (175 mcg  total) by mouth daily before breakfast.  30 tablet  1  . Omega-3 300 MG CAPS Take 300 mg by mouth at bedtime.       . pantoprazole (PROTONIX) 40 MG tablet Take 1 tablet (40 mg total) by mouth daily.  30 tablet  1  . rosuvastatin (CRESTOR) 20 MG tablet Take 1 tablet (20 mg total) by mouth daily.  90 tablet  3  . temazepam (RESTORIL) 15 MG capsule Take 1 capsule (15 mg total) by mouth at bedtime.  30 capsule  3  . warfarin (COUMADIN) 5 MG tablet Take as directed for INR 2.0 - 3.0 (Take 5 mg daily except take 7.5 mg on Wednesday 01/24/14)       No current facility-administered medications for this encounter.    Ace inhibitors; Diovan; and Lipitor  REVIEW OF SYSTEMS: All systems negative except as listed in HPI, PMH and Problem list.  Filed Vitals:   02/05/14 0933  BP: 132/0  Pulse:  84  Height: 5\' 9"  (1.753 m)  Weight: 153 lb 9.6 oz (69.673 kg)  SpO2: 98%   LVAD INTERROGATION:  Had low flow alarms noted 6/6  HeartMate II LVAD:  Speed: 9000 Flow 4.1 Power: 4.7 PI: 7.8 Alarms: None Events: none Fixed Speed: 9000 Low speed: 8400     I reviewed the LVAD parameters from today, and compared the results to the patient's prior recorded data.  No programming changes were made.  The LVAD is functioning within specified parameters.  The patient performs LVAD self-test daily.  LVAD interrogation was negative for any significant power changes, alarms or PI events/speed drops.  LVAD equipment check completed and is in good working order.  Back-up equipment present.   LVAD education done on emergency procedures and precautions and reviewed exit site care.    Filed Vitals:   02/05/14 0933  BP: 132/0  Pulse: 84  Height: 5\' 9"  (1.753 m)  Weight: 153 lb 9.6 oz (69.673 kg)  SpO2: 98%     MAP 132  Physical Exam: GENERAL: Well appearing, male who presents to clinic today in no acute distress; Wife present HEENT: normal  NECK: Supple, JVP   ; no bruits.  No lymphadenopathy or thyromegaly appreciated.    CARDIAC:  Mechanical heart sounds with LVAD hum present.  LUNGS:  Clear to auscultation bilaterally.  ABDOMEN:  Soft, round, nontender, positive bowel sounds x4.     LVAD exit site: well-healed and semi incorporated.  Dressing dry and intact.  No erythema or drainage.  Stabilization device present and accurately applied.  Driveline dressing is being changed weekly per sterile technique. EXTREMITIES:  Warm and dry, no cyanosis, clubbing, rash.  No lower extremity edema  NEUROLOGIC:  Alert and oriented x 4.  Gait steady.  No aphasia.  No dysarthria.  Affect pleasant.     ASSESSMENT AND PLAN:   1) Chronic systolic HF: ICM, EF 0000000 s/p LVAD HM II implant 08/2013.    - NYHA II symptoms and volume status stable. He is on PRN Lasix 20 mg  - Not on BB with history of RV  failure -MAP up today 132. Add 12.5 mg hydralazine tid  - Plan for RHC this week.  - Reinforced the need and importance of daily weights, a low sodium diet, and fluid restriction (less than 2 L a day). Instructed to call the HF clinic if weight increases more than 3 lbs overnight or 5 lbs in a week.  2) LVAD - Doing well. All LVAD  parameters nl. Will check LDH, CBD, INR and BMET today.  - As above MAP up.  - Currently status 7 on [facility]. Contacted [facility] to begin workup for re-activating status. 3) Chronic anticoagulation - INR goal 2.0-3.0. No bleeding issues. Adjust coumadin accordingly. . - Continue ASA 325 mg for LVAD 4) HTN - Elevated. Intolerant Lisinopril, Aldactone, and Norvasc Add 12.5 mg hydralazine tid.  5) Hypothyroidism Per Dr Dwyane Dee. Synthroid cut back to 175 mcg. Will need repeat TSH in 6 weeks.   Follow up on Thursday for  RHC.    Amy D Clegg NP-C  9:38 AM  Patient seen and examined with Darrick Grinder, NP. We discussed all aspects of the encounter. I agree with the assessment and plan as stated above. Doing very well. MAPs running high. Agree with adding hydralazine (he has been unable to tolerate other agents.) Reinforced need for daily weights and reviewed use of sliding scale diuretics. Will reinitiate transplant process. i have d/w Dr. Emelia Loron at [facility] who agrees. Plan RHC this week.   Daniel Bensimhon,MD 11:39 AM

## 2014-02-08 NOTE — Op Note (Signed)
Procedure-emergency attempted right chest tube placement for suspected hemothorax  Pre-and postop diagnosis-acute severe hemoptysis during central line placement requiring emergency intubation and resuscitation  Surgeon Tharon Aquas trigt M.D. Anesthesia-local 1% lidocaine  Indications-the patient was having a right heart cath for assessment of hemodynamic in preparation for future cardiac transplantation. He is been on chronic Coumadin. An attempted right IJ central line was made that resulted in severe hemoptysis requiring emergency intubation for airway control. The patient had previously developed a right pneumothorax during a right central line placement and there is concern the patient had a right hemothorax as well. I was paged stat to the cath lab and the patient was being resuscitated with a code being called. The right chest had been prepped by critical care physician and a chest tube setup was at the bedside.  The right chest was prepped and draped as a sterile field. A 1% lidocaine was infiltrated in 8 site of a previously placed right chest tube for pneumothorax several months ago. Further lidocaine was infiltrated in interspace. I placed my finger into the pleural space however there is no free space and the lung was adherent to the chest wall. I withdrew my finger and close that incision with some interrupted silk suture.  A second small incision was made under local anesthesia at the interspace above to make sure that there is no hemothorax. Again gentle entry into and between the ribs with a blunt finger demonstrated no free space and in no hemothorax. Pressure was held for hemostasis and the second skin incision was closed with interrupted silk.  In summary the patient did not have a chest tube placed. During the resuscitation of the respiratory arrest from hemoptysis and suspected hemothorax an attempt at draining a hemothorax was performed but no pneumothorax was  encountered.  Sterile dressings were placed on both incisions. The patient returned to  the ICU from the cath lab for further critical care and ventilator support.

## 2014-02-08 NOTE — Progress Notes (Signed)
VAD coordinator paged to cath lab for pt's [place]. VAD parameters during procedure:     Flow  Speed  PI  Power  MAP 8:20   3.6  9000  7.6  4.5  142  Sedation 8:25   3.5  9000  7.5  4.5  146  8:35   3.6  9000  7.6  4.5  146  IV hydralazine 8:45   3.6  9000  7.5  4.5  140  9:00   3.5  9000  7.1  4.6  120  9:20   3.8  9000  6.9  4.5  110  9:30   4.0  9000  7.0  4.4  110   9:40   4.8  9000  6.9  5.1  91 (A-line)  Accompanied patient to 2S-03; VAD parameters remain at baseline; MAP improving.

## 2014-02-08 NOTE — Progress Notes (Addendum)
Brief Progress Note:  Patient transferred to ICU and is stable. MAP 88. SR 70s. He is intubated and sedated. Will recheck CXR and CBC.  Will start prophylactic abx for a couple of doses since CT placement for suspected hemothorax was attempted emergently, however patient did not need CT placement and did not have a hemothorax. Will continue to follow.  Junie Bame B NP-C 10:47 AM

## 2014-02-08 NOTE — CV Procedure (Signed)
  Patient brought to cath lab for right heart catheterization as part of pre-transplant w/u.  Initially planned femoral vein approach but INR 2.85 so decided to proceed with RIJ approach. Being cognizant of previous issues with RIJ access, ultrasound guidance used. Unable to cannulate vessel despite direct visualization under u/s. Patient the began to develop cough with hemoptysis concerning for recurrent hemothorax.   CCM, anesthesia and Dr. Prescott Gum paged and arrived quickly. Patient intubated with scant bloody secretions. Chest tube attempted but no evidence of bleeding into pleural space so access sites sutured. CXR with mild pulmonary hemorrhage. Given racemic epi. Emergent type and screen sent. Will give FFP and transfer to ICU for careful montitoring with serial CXRs    Family updated.   Benay Spice 9:37 AM

## 2014-02-08 NOTE — Interval H&P Note (Signed)
History and Physical Interval Note:  02/08/2014 9:31 AM  Johnny Oneill  has presented today for surgery, with the diagnosis of heart failure  The various methods of treatment have been discussed with the patient and family. After consideration of risks, benefits and other options for treatment, the patient has consented to  Procedure(s): RIGHT HEART CATH (N/A) as a surgical intervention .  The patient's history has been reviewed, patient examined, no change in status, stable for surgery.  I have reviewed the patient's chart and labs.  Questions were answered to the patient's satisfaction.     Laiylah Roettger

## 2014-02-09 ENCOUNTER — Inpatient Hospital Stay (HOSPITAL_COMMUNITY): Payer: Medicare Other

## 2014-02-09 DIAGNOSIS — I5022 Chronic systolic (congestive) heart failure: Secondary | ICD-10-CM

## 2014-02-09 LAB — CBC WITH DIFFERENTIAL/PLATELET
Basophils Absolute: 0 10*3/uL (ref 0.0–0.1)
Basophils Relative: 0 % (ref 0–1)
Eosinophils Absolute: 0.1 10*3/uL (ref 0.0–0.7)
Eosinophils Relative: 1 % (ref 0–5)
HCT: 29.3 % — ABNORMAL LOW (ref 39.0–52.0)
Hemoglobin: 9.1 g/dL — ABNORMAL LOW (ref 13.0–17.0)
Lymphocytes Relative: 6 % — ABNORMAL LOW (ref 12–46)
Lymphs Abs: 0.4 10*3/uL — ABNORMAL LOW (ref 0.7–4.0)
MCH: 27.3 pg (ref 26.0–34.0)
MCHC: 31.1 g/dL (ref 30.0–36.0)
MCV: 88 fL (ref 78.0–100.0)
Monocytes Absolute: 0.4 10*3/uL (ref 0.1–1.0)
Monocytes Relative: 6 % (ref 3–12)
Neutro Abs: 5.4 10*3/uL (ref 1.7–7.7)
Neutrophils Relative %: 87 % — ABNORMAL HIGH (ref 43–77)
Platelets: 179 10*3/uL (ref 150–400)
RBC: 3.33 MIL/uL — ABNORMAL LOW (ref 4.22–5.81)
RDW: 19.1 % — ABNORMAL HIGH (ref 11.5–15.5)
WBC: 6.2 10*3/uL (ref 4.0–10.5)

## 2014-02-09 LAB — GLUCOSE, CAPILLARY
Glucose-Capillary: 88 mg/dL (ref 70–99)
Glucose-Capillary: 125 mg/dL — ABNORMAL HIGH (ref 70–99)

## 2014-02-09 LAB — COMPREHENSIVE METABOLIC PANEL
ALT: 24 U/L (ref 0–53)
AST: 16 U/L (ref 0–37)
Albumin: 3.1 g/dL — ABNORMAL LOW (ref 3.5–5.2)
Alkaline Phosphatase: 108 U/L (ref 39–117)
BUN: 16 mg/dL (ref 6–23)
CO2: 22 mEq/L (ref 19–32)
Calcium: 8.9 mg/dL (ref 8.4–10.5)
Chloride: 108 mEq/L (ref 96–112)
Creatinine, Ser: 1.09 mg/dL (ref 0.50–1.35)
GFR calc Af Amer: 77 mL/min — ABNORMAL LOW (ref >90)
GFR calc non Af Amer: 67 mL/min — ABNORMAL LOW (ref >90)
Glucose, Bld: 90 mg/dL (ref 70–99)
Potassium: 3.9 mEq/L (ref 3.7–5.3)
Sodium: 145 mEq/L (ref 137–147)
Total Bilirubin: 1 mg/dL (ref 0.3–1.2)
Total Protein: 6.5 g/dL (ref 6.0–8.3)

## 2014-02-09 LAB — PREPARE FRESH FROZEN PLASMA
Unit division: 0
Unit division: 0

## 2014-02-09 LAB — LACTATE DEHYDROGENASE: LDH: 233 U/L (ref 94–250)

## 2014-02-09 LAB — PROTIME-INR
INR: 2.23 — ABNORMAL HIGH (ref 0.00–1.49)
Prothrombin Time: 24 seconds — ABNORMAL HIGH (ref 11.6–15.2)

## 2014-02-09 MED ORDER — PANTOPRAZOLE SODIUM 40 MG PO TBEC
40.0000 mg | DELAYED_RELEASE_TABLET | Freq: Every day | ORAL | Status: DC
  Administered 2014-02-10 – 2014-02-12 (×3): 40 mg via ORAL
  Filled 2014-02-09 (×3): qty 1

## 2014-02-09 MED ORDER — FE FUMARATE-B12-VIT C-FA-IFC PO CAPS
1.0000 | ORAL_CAPSULE | Freq: Every day | ORAL | Status: DC
  Administered 2014-02-09 – 2014-02-12 (×4): 1 via ORAL
  Filled 2014-02-09 (×4): qty 1

## 2014-02-09 MED ORDER — FENTANYL CITRATE 0.05 MG/ML IJ SOLN: 12.5000 ug | INTRAMUSCULAR | Status: DC | PRN

## 2014-02-09 MED ORDER — ROSUVASTATIN CALCIUM 20 MG PO TABS
20.0000 mg | ORAL_TABLET | Freq: Every day | ORAL | Status: DC
  Administered 2014-02-09 – 2014-02-11 (×3): 20 mg via ORAL
  Filled 2014-02-09 (×4): qty 1

## 2014-02-09 MED ORDER — WARFARIN SODIUM 5 MG PO TABS
5.0000 mg | ORAL_TABLET | Freq: Once | ORAL | Status: DC
  Filled 2014-02-09: qty 1

## 2014-02-09 MED ORDER — HYDRALAZINE HCL 25 MG PO TABS
25.0000 mg | ORAL_TABLET | Freq: Three times a day (TID) | ORAL | Status: DC
  Administered 2014-02-09 (×2): 25 mg via ORAL
  Filled 2014-02-09 (×5): qty 1

## 2014-02-09 MED ORDER — EZETIMIBE 10 MG PO TABS
10.0000 mg | ORAL_TABLET | Freq: Every day | ORAL | Status: DC
  Administered 2014-02-09 – 2014-02-12 (×4): 10 mg via ORAL
  Filled 2014-02-09 (×4): qty 1

## 2014-02-09 MED ORDER — CITALOPRAM HYDROBROMIDE 10 MG PO TABS
10.0000 mg | ORAL_TABLET | Freq: Every day | ORAL | Status: DC
  Administered 2014-02-09 – 2014-02-12 (×4): 10 mg via ORAL
  Filled 2014-02-09 (×4): qty 1

## 2014-02-09 MED ORDER — FUROSEMIDE 20 MG PO TABS
20.0000 mg | ORAL_TABLET | Freq: Every day | ORAL | Status: DC
  Administered 2014-02-09 – 2014-02-12 (×4): 20 mg via ORAL
  Filled 2014-02-09 (×5): qty 1

## 2014-02-09 MED ORDER — MENTHOL 3 MG MT LOZG
1.0000 | LOZENGE | OROMUCOSAL | Status: DC | PRN
  Filled 2014-02-09 (×2): qty 9

## 2014-02-09 MED ORDER — WARFARIN - PHARMACIST DOSING INPATIENT: Freq: Every day | Status: DC

## 2014-02-09 MED ORDER — ASPIRIN 325 MG PO TABS
325.0000 mg | ORAL_TABLET | Freq: Every day | ORAL | Status: DC
  Administered 2014-02-09 – 2014-02-10 (×2): 325 mg via ORAL
  Filled 2014-02-09 (×2): qty 1

## 2014-02-09 MED ORDER — TEMAZEPAM 15 MG PO CAPS
15.0000 mg | ORAL_CAPSULE | Freq: Every day | ORAL | Status: DC
  Administered 2014-02-09 – 2014-02-11 (×4): 15 mg via ORAL
  Filled 2014-02-09 (×3): qty 1

## 2014-02-09 NOTE — Progress Notes (Signed)
ANTIBIOTIC CONSULT NOTE - INITIAL  Pharmacy Consult for Vancomycin / Ceftazidime  Indication:  Chest tube placement attempt   Allergies  Allergen Reactions  . Ace Inhibitors     Hypotension and elevated creatinine  . Diovan [Valsartan] Other (See Comments)    Hypotension at low dose, hyperkalemia  . Lipitor [Atorvastatin Calcium] Other (See Comments)    Muscle pain  . Norvasc [Amlodipine] Other (See Comments)    Put patient to sleep    Patient Measurements: Height: 5\' 7"  (170.2 cm) Weight: 147 lb 0.8 oz (66.7 kg) IBW/kg (Calculated) : 66.1   Vital Signs: Temp: 99.1 F (37.3 C) (06/12 0727) Temp src: Axillary (06/12 0727) Pulse Rate: 71 (06/12 0700) Intake/Output from previous day: 06/11 0701 - 06/12 0700 In: 1097.3 [I.V.:517.3; NG/GT:180; IV Piggyback:400] Out: [id number] [Urine:3340; Emesis/NG output:300] Intake/Output from this shift:    Labs:  Recent Labs  02/08/14 1045 02/08/14 1635 02/09/14 0400  WBC 7.7 5.7 6.2  HGB 9.7* 8.7* 9.1*  PLT 172 167 179  CREATININE 1.28  --  1.09   Estimated Creatinine Clearance: 59 ml/min (by C-G formula based on Cr of 1.09). No results found for this basename: VANCOTROUGH, Corlis Leak, VANCORANDOM, [place], GENTPEAK, GENTRANDOM, TOBRATROUGH, TOBRAPEAK, TOBRARND, AMIKACINPEAK, AMIKACINTROU, AMIKACIN,  in the last 72 hours   Microbiology: Recent Results (from the past 720 hour(s))  MRSA PCR SCREENING     Status: None   Collection Time    02/08/14 11:57 AM      Result Value Ref Range Status   MRSA by PCR NEGATIVE  NEGATIVE Final   Comment:            The GeneXpert MRSA Assay (FDA     approved for NASAL specimens     only), is one component of a     comprehensive MRSA colonization     surveillance program. It is not     intended to diagnose MRSA     infection nor to guide or     monitor treatment for     MRSA infections.    Medical History: Past Medical History  Diagnosis Date  . Ischemic cardiomyopathy     a. 10/09  Echo: Sev LV Dysfxn, inf/lat [place], Mod MR.  [name];  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 Hypertension   . PVC (premature ventricular contraction)   . Pacemaker   . CHF (congestive heart failure)   . Shortness of breath   . Automatic implantable cardioverter-defibrillator in situ      Assessment:  72yom admitted after respiratory distress in cath lab and intubated.  Chest tube placement was attempted but no hemothorax so not placed.  Broad spectrum ABX started for infection prevention.   Goal of Therapy:  Vancomycin trough level 15-20 mcg/ml  Plan:  Vancomycin 1gm x1 then 500mg  q12 Ceftazidime 1gm q12  Bonnita Nasuti Pharm.D. CPP, BCPS Clinical Pharmacist [phone] 02/09/2014 8:21 AM     Nadine Counts 02/09/2014,8:17 AM

## 2014-02-09 NOTE — Progress Notes (Signed)
RASS -1. + F/C  Filed Vitals:   02/09/14 0700 02/09/14 0727 02/09/14 0800 02/09/14 0830  BP:      Pulse: 71  84 91  Temp:  99.1 F (37.3 C)    TempSrc:  Axillary    Resp: 16  18 19   Height:      Weight:      SpO2: 100%  100% 100%   NAD HEENT WNL No neck hematoma JVP not visualized Scattered rhonchi, no wheeze LVAD hum, HS obscured Abd soft, NT Ext warm without edema  I have reviewed all of today's lab results. Relevant abnormalities are discussed in the A/P section  CXR: blunting R CP angle, no overt edema   IMPRESSION: VDRF after emergent intubation 6/11 Hemoptysis, resolved Severe CM, s/p LVAD Chronic anticoagulation Hypokalemia, resolved Mild anemia, stable   PLAN/REC:  Extubation orders entered HD mgmt per Cards. TCTS If tolerates extubation, PCCM will sign off   Merton Border, MD ; [facility] [phone].  After 5:30 PM or weekends, call [phone]

## 2014-02-09 NOTE — Progress Notes (Signed)
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   Admitted 3/4-3/25/15 with SOB and syncopal episode. Found to have SBO and NG placed and started on TPN. On 11/06/13 developed progressive SOB and hypotension and co-ox 51% and Hgb 6.7. Started on milrinone and transfused. ECHo showed nl RV function. Patient had PEA arrest and was intubated and started on pressors. CXR revelaed a R hemothorax and a R CT placed. He required VATs and evacuation of hemothorax and then tansferred to CIR.   Patient saw Dr. Rayann Heman 01/24/14 for device follow up. Per note: pt is stable and on an appropriate medical regimen. Normal ICD function. R waves have decreased since LVAD placement; CXR revealed stable lead position. He plans to follow clinically and try to avoid lead revision if possible.  Planned RHC 6/11 for transplant workup. Patient was stuck under [place] and then developed hemoptysis. There was concern for hemothorax and PVT called to bedside to emergent attempt at CT, however no pleural fluid. He was intubated and transferred to ICU.   Stable overnight. MAPs 80-90s. Awake and calm this morning. Following commands. Hgb stable adn CXr no evidence of hemothorax. Small right effusion.    LVAD INTERROGATION:  HeartMate II LVAD:  Flow 4.0 liters/min, speed 9200, power 5.0, PI 7.3.    Objective:    Vital Signs:   Temp:  [97.2 F (36.2 C)-99.1 F (37.3 C)] 99.1 F (37.3 C) (06/12 0727) Pulse Rate:  [58-110] 71 (06/12 0700) Resp:  [16-23] 16 (06/12 0700) BP: (112)/(84) 112/84 mmHg (06/11 1625) SpO2:  [99 %-100 %] 100 % (06/12 0700) Arterial Line BP: (78-129)/(68-92) 116/87 mmHg (06/12 0700) FiO2 (%):  [40 %-100 %] 40 % (06/12 0744) Weight:  [147 lb 0.8 oz (66.7 kg)] 147 lb 0.8 oz (66.7 kg) (06/12 0500) Last BM Date: 02/08/14 Mean arterial Pressure  80-90s  Intake/Output:   Intake/Output Summary (Last 24 hours) at 02/09/14 0811 Last data filed at 02/09/14 0700  Gross per 24 hour  Intake 1097.29 ml  Output   3640 ml  Net -2542.71 ml     Physical Exam: General:  Intubated and lethargic HEENT: normal Neck: supple. JVP 7 . Carotids 2+ bilat; no bruits. No lymphadenopathy or thryomegaly appreciated. Cor: Mechanical heart sounds with LVAD hum present. Lungs: clear Abdomen: soft, nontender, nondistended. No hepatosplenomegaly. No bruits or masses. Good bowel sounds. Driveline: C/D/I; securement device intact and driveline incorporated Extremities: no cyanosis, clubbing, rash, edema Neuro: alert & orientedx3, cranial nerves grossly intact. moves all 4 extremities w/o difficulty. Affect pleasant  Telemetry: SR 80s  Labs: Basic Metabolic Panel:  Recent Labs Lab 02/05/14 0832 02/08/14 1045 02/09/14 0400  NA 143 141 145  K 3.8 2.8* 3.9  CL 102 101 108  CO2 29 22 22   GLUCOSE 94 137* 90  BUN 13 19 16   CREATININE 1.38* 1.28 1.09  CALCIUM 9.4 8.5 8.9    Liver Function Tests:  Recent Labs Lab 02/05/14 0832 02/09/14 0400  AST 43* 16  ALT 75* 24  ALKPHOS 171* 108  BILITOT 1.2 1.0  PROT 7.9 6.5  ALBUMIN 3.7 3.1*   No results found for this basename: LIPASE, AMYLASE,  in the last 168 hours No results found for this basename: AMMONIA,  in the last 168 hours  CBC:  Recent Labs Lab 02/05/14 0832 02/08/14 1045 02/08/14 1635 02/09/14  0400  WBC 7.5 7.7 5.7 6.2  NEUTROABS  --  6.7  --  5.4  HGB 12.7* 9.7* 8.7* 9.1*  HCT 40.0 30.7* 27.9* 29.3*  MCV 87.1 88.7 87.7 88.0  PLT 234 172 167 179    INR:  Recent Labs Lab 02/05/14 0832 02/08/14 0647 02/09/14 0400  INR 3.43* 2.85* 2.23*   Imaging: Dg Chest Port 1 View  02/09/2014   CLINICAL DATA:  Hemothorax evacuation  EXAM: PORTABLE CHEST - 1 VIEW  COMPARISON:  February 08, 2014  FINDINGS: Endotracheal tube tip is 5.1 cm above the carina. Nasogastric tube tip and  side port are below the diaphragm. Pacemaker leads are attached to the right atrium and right ventricle. There is no demonstrable pneumothorax.  There is a minimal pleural effusion on the right. Lungs elsewhere are clear. Heart is upper normal in size with normal pulmonary vascularity. No adenopathy. Left ventricular assist device is present.  IMPRESSION: Tube positions as described without pneumothorax. Small right effusion. No edema or consolidation.   Electronically Signed   By: Lowella Grip M.D.   On: 02/09/2014 08:08   Dg Chest Port 1 View  02/08/2014   CLINICAL DATA:  Hemothorax  EXAM: PORTABLE CHEST - 1 VIEW  COMPARISON:  Film from earlier in the same day.  FINDINGS: Pacing device is again seen. Left ventricular assist device is again noted. The nasogastric catheter remains in the stomach. An endotracheal tube is again seen 3 cm above the carina. The lungs are again well aerated with very minimal left basilar atelectasis. The cardiac shadow is stable.  IMPRESSION: Minimal left basilar atelectasis.   Electronically Signed   By: Inez Catalina M.D.   On: 02/08/2014 19:29   Dg Chest Port 1 View  02/08/2014   CLINICAL DATA:  Hemothorax  EXAM: PORTABLE CHEST - 1 VIEW  COMPARISON:  Portable exam 1357 hr compared to 02/08/2014  FINDINGS: Tip of endotracheal tube projects approximately 5.2 cm above carina.  LEFT subclavian AICD leads project over RIGHT atrium RIGHT ventricle.  Nasogastric tube extends into stomach.  LEFT ventricular assist device again identified.  Enlargement of cardiac silhouette with pulmonary vascular congestion post median sternotomy.  Slight pulmonary vascular congestion.  Increased opacity peripherally in lower RIGHT hemi thorax question infiltrate versus loculated pleural fluid.  Remaining lungs clear.  No pneumothorax.  IMPRESSION: Postoperative changes as above with increased opacity laterally in the lower RIGHT hemi thorax question increased infiltrate versus loculated pleural  fluid collection.   Electronically Signed   By: Lavonia Dana M.D.   On: 02/08/2014 14:20   Dg Chest Portable 1 View  02/08/2014   CLINICAL DATA:  Endotracheal tube replacement  EXAM: PORTABLE CHEST - 1 VIEW  COMPARISON:  Portable chest x-ray of February 08, 2014 at 9:17 a.m.  FINDINGS:  The endotracheal tube tip now lies approximately 6 cm above the crotch of the carina. The lungs are well-expanded. The interstitial markings in the right lung are slightly less conspicuous currently. The permanent pacemaker defibrillator is unchanged in position. The left ventricular assist device is unchanged in position.  IMPRESSION: The endotracheal tube is in appropriate position. Mild improvement in the pulmonary interstitium on the right has occurred.   Electronically Signed   By: David  Martinique   On: 02/08/2014 11:06   Dg Chest Port 1 View  02/08/2014   CLINICAL DATA:  Intubation.  Hemoptysis  EXAM: PORTABLE CHEST - 1 VIEW  COMPARISON:  11/21/2013  FINDINGS: Endotracheal tube at the carina  directed towards the right main bronchus. Recommend withdrawal 3 cm.  Right upper lobe infiltrate may represent hemorrhage or edema. Mild vascular congestion is present. Pleural thickening bilaterally unchanged. Left ventricular assist device noted. AICD noted.  IMPRESSION: Endotracheal to in the proximal right main bronchus. Recommend withdrawal 3 cm.  Pulmonary vascular congestion. Right upper lobe infiltrate may represent hemorrhage given the history of hemoptysis. Edema also possible.  These results were called by telephone at the time of interpretation on 02/08/2014 at 9:34 AM to Dr. Haroldine Laws, who verbally acknowledged these results.   Electronically Signed   By: Franchot Gallo M.D.   On: 02/08/2014 09:35      Medications:     Scheduled Medications: . antiseptic oral rinse  15 mL Mouth Rinse QID  . cefTAZidime (FORTAZ)  IV  1 g Intravenous Q12H  . chlorhexidine  15 mL Mouth Rinse BID  . levothyroxine  175 mcg Oral QAC  breakfast  . pantoprazole (PROTONIX) IV  40 mg Intravenous Q24H  . vancomycin  500 mg Intravenous Q12H     Infusions: . dextrose 5 % and 0.9% NaCl 10 mL/hr at 02/09/14 0000  . propofol 30 mcg/kg/min (02/09/14 0700)     PRN Medications:  albuterol, fentaNYL, fentaNYL, hydrALAZINE   Assessment:   1) Suspected hemothorax 2) Acute respiratory failure - intubated 3) Chronic systolic HF - s/p LVAD  4) HTN  Plan/Discussion:    Johnny Oneill is known to the HF team and presented today for schedule RHC to assess hemodynamics for transplant evaluation.   Ultrasound was used on RIJ however patient started coughing and developed hemoptysis. Concern for hemothorax and PVT called to bedside. CT attempted but no evidence of bleeding. Pt intubated and started on diprivan. No pneumothorax or hemothorax. CXR with pulmonary hemorrhage.  Stable overnight. No LVAD alarms. MAPs 90-100s. Alert and following commands. Hopefully will extubate today. Will still need RHC eventually to assess hemodynamics for tx eval. Consider over the weekend allowing INR to trend down and starting heparin to do RHC Monday through the R groin. INR 2.2 will hold coumadin again tonight.   MAPs have been a huge issue for patient and has been as high as 140. Will need to get controlled before going home and adjust meds. Last pro-BNP was 5000 and PI 7.9 will increase speed to 9200 and follow.  Will check pro-BNP tomorrow.   I reviewed the LVAD parameters from today, and compared the results to the patient's prior recorded data.  No programming changes were made.  The LVAD is functioning within specified parameters.  The patient performs LVAD self-test daily.  LVAD interrogation was negative for any significant power changes, alarms or PI events/speed drops.  LVAD equipment check completed and is in good working order.  Back-up equipment present.   LVAD education done on emergency procedures and precautions and reviewed exit site  care.  Length of Stay: 1  Rande Brunt NP-C 02/09/2014, 8:11 AM  VAD Team --- VAD ISSUES ONLY--- Pager [phone] (7am - 7am)  Advanced Heart Failure Team  Pager [phone] (M-F; 7a - 4p)  Please contact [facility] for night-coverage after hours (4p -7a ) and weekends on [email or web address]  Patient seen and examined with Junie Bame, NP. We discussed all aspects of the encounter. I agree with the assessment and plan as stated above.   Doing very well. Now extubated. No further hemoptysis. Hgb stable. Will begin to mobilize. VAD parameters stable. INR remains therapeutic. Can restart coumadin.  Johnny Quince  Zanai Mallari,MD 2:04 PM

## 2014-02-09 NOTE — Procedures (Signed)
Extubation Procedure Note  Patient Details:   Name: Johnny Oneill DOB: 04-Jan-1941 MRN: [id number]   Airway Documentation:     Evaluation  O2 sats: stable throughout Complications: No apparent complications Patient did tolerate procedure well. Bilateral Breath Sounds: Clear;Diminished   Yes Pt extubated per Dr. Leonidas Romberg, placed on 4lpm 02 [place], and able to say his name. Tolerated well.   Cordella Register 02/09/2014, 9:44 AM

## 2014-02-10 ENCOUNTER — Inpatient Hospital Stay (HOSPITAL_COMMUNITY): Payer: Medicare Other

## 2014-02-10 LAB — CBC WITH DIFFERENTIAL/PLATELET
Basophils Absolute: 0 10*3/uL (ref 0.0–0.1)
Basophils Relative: 0 % (ref 0–1)
Eosinophils Absolute: 0.3 10*3/uL (ref 0.0–0.7)
Eosinophils Relative: 4 % (ref 0–5)
HCT: 30.2 % — ABNORMAL LOW (ref 39.0–52.0)
Hemoglobin: 9.3 g/dL — ABNORMAL LOW (ref 13.0–17.0)
Lymphocytes Relative: 10 % — ABNORMAL LOW (ref 12–46)
Lymphs Abs: 0.7 10*3/uL (ref 0.7–4.0)
MCH: 27.8 pg (ref 26.0–34.0)
MCHC: 30.8 g/dL (ref 30.0–36.0)
MCV: 90.1 fL (ref 78.0–100.0)
Monocytes Absolute: 0.6 10*3/uL (ref 0.1–1.0)
Monocytes Relative: 9 % (ref 3–12)
Neutro Abs: 5.3 10*3/uL (ref 1.7–7.7)
Neutrophils Relative %: 77 % (ref 43–77)
Platelets: 177 10*3/uL (ref 150–400)
RBC: 3.35 MIL/uL — ABNORMAL LOW (ref 4.22–5.81)
RDW: 19.7 % — ABNORMAL HIGH (ref 11.5–15.5)
WBC: 6.9 10*3/uL (ref 4.0–10.5)

## 2014-02-10 LAB — BASIC METABOLIC PANEL
BUN: 16 mg/dL (ref 6–23)
CO2: 24 mEq/L (ref 19–32)
Calcium: 8.6 mg/dL (ref 8.4–10.5)
Chloride: 104 mEq/L (ref 96–112)
Creatinine, Ser: 1 mg/dL (ref 0.50–1.35)
GFR calc Af Amer: 86 mL/min — ABNORMAL LOW (ref >90)
GFR calc non Af Amer: 74 mL/min — ABNORMAL LOW (ref >90)
Glucose, Bld: 98 mg/dL (ref 70–99)
Potassium: 3.6 mEq/L — ABNORMAL LOW (ref 3.7–5.3)
Sodium: 141 mEq/L (ref 137–147)

## 2014-02-10 LAB — GLUCOSE, CAPILLARY
Glucose-Capillary: 100 mg/dL — ABNORMAL HIGH (ref 70–99)
Glucose-Capillary: 117 mg/dL — ABNORMAL HIGH (ref 70–99)
Glucose-Capillary: 139 mg/dL — ABNORMAL HIGH (ref 70–99)
Glucose-Capillary: 90 mg/dL (ref 70–99)

## 2014-02-10 LAB — PROTIME-INR
INR: 2.05 — ABNORMAL HIGH (ref 0.00–1.49)
Prothrombin Time: 22.5 seconds — ABNORMAL HIGH (ref 11.6–15.2)

## 2014-02-10 LAB — PRO B NATRIURETIC PEPTIDE: Pro B Natriuretic peptide (BNP): 1528 pg/mL — ABNORMAL HIGH (ref 0–125)

## 2014-02-10 LAB — LACTATE DEHYDROGENASE: LDH: 213 U/L (ref 94–250)

## 2014-02-10 MED ORDER — ASPIRIN EC 325 MG PO TBEC
325.0000 mg | DELAYED_RELEASE_TABLET | Freq: Every day | ORAL | Status: DC
  Administered 2014-02-11 – 2014-02-12 (×2): 325 mg via ORAL
  Filled 2014-02-10 (×2): qty 1

## 2014-02-10 MED ORDER — SODIUM CHLORIDE 0.9 % IV SOLN
250.0000 mL | INTRAVENOUS | Status: DC | PRN
  Administered 2014-02-10: 10 mL via INTRAVENOUS

## 2014-02-10 MED ORDER — WARFARIN SODIUM 7.5 MG PO TABS
7.5000 mg | ORAL_TABLET | Freq: Once | ORAL | Status: AC
  Administered 2014-02-10: 7.5 mg via ORAL
  Filled 2014-02-10: qty 1

## 2014-02-10 MED ORDER — POTASSIUM CHLORIDE CRYS ER 20 MEQ PO TBCR
20.0000 meq | EXTENDED_RELEASE_TABLET | ORAL | Status: AC
  Administered 2014-02-10 (×2): 20 meq via ORAL
  Filled 2014-02-10 (×2): qty 1

## 2014-02-10 MED ORDER — WARFARIN - PHARMACIST DOSING INPATIENT: Freq: Every day | Status: DC

## 2014-02-10 MED ORDER — HYDRALAZINE HCL 25 MG PO TABS
37.5000 mg | ORAL_TABLET | Freq: Three times a day (TID) | ORAL | Status: DC
  Administered 2014-02-10 – 2014-02-11 (×4): 37.5 mg via ORAL
  Filled 2014-02-10 (×7): qty 1.5

## 2014-02-10 MED ORDER — WARFARIN SODIUM 5 MG PO TABS
5.0000 mg | ORAL_TABLET | Freq: Once | ORAL | Status: DC
Start: 2014-02-10 — End: 2014-02-10
  Filled 2014-02-10: qty 1

## 2014-02-10 MED ORDER — SODIUM CHLORIDE 0.9 % IJ SOLN
3.0000 mL | Freq: Two times a day (BID) | INTRAMUSCULAR | Status: DC
  Administered 2014-02-10 – 2014-02-11 (×2): 3 mL via INTRAVENOUS

## 2014-02-10 MED ORDER — SODIUM CHLORIDE 0.9 % IJ SOLN: 3.0000 mL | INTRAMUSCULAR | Status: DC | PRN

## 2014-02-10 NOTE — Progress Notes (Addendum)
ANTIBIOTIC CONSULT NOTE - Follow Up  Pharmacy Consult for Vancomycin / Ceftazidime  Indication:  Chest tube placement attempt   Allergies  Allergen Reactions  . Ace Inhibitors     Hypotension and elevated creatinine  . Diovan [Valsartan] Other (See Comments)    Hypotension at low dose, hyperkalemia  . Lipitor [Atorvastatin Calcium] Other (See Comments)    Muscle pain  . Norvasc [Amlodipine] Other (See Comments)    Put patient to sleep    Patient Measurements: Height: 5\' 7"  (170.2 cm) Weight: 148 lb 9.4 oz (67.4 kg) IBW/kg (Calculated) : 66.1   Vital Signs: Temp: 97.8 F (36.6 C) (06/13 0755) Temp src: Oral (06/13 0755) Pulse Rate: 86 (06/13 0900) Intake/Output from previous day: 06/12 0701 - 06/13 0700 In: 620.4 [P.O.:150; I.V.:140.4; NG/GT:30; IV Piggyback:300] Out: 1530 [Urine:1530] Intake/Output from this shift: Total I/O In: -  Out: 50 [Urine:50]  Labs:  Recent Labs  02/08/14 1045 02/08/14 1635 02/09/14 0400 02/10/14 0420  WBC 7.7 5.7 6.2 6.9  HGB 9.7* 8.7* 9.1* 9.3*  PLT 172 167 179 177  CREATININE 1.28  --  1.09 1.00   Estimated Creatinine Clearance: 64.3 ml/min (by C-G formula based on Cr of 1). No results found for this basename: VANCOTROUGH, Corlis Leak, VANCORANDOM, [place], GENTPEAK, GENTRANDOM, TOBRATROUGH, TOBRAPEAK, TOBRARND, AMIKACINPEAK, AMIKACINTROU, AMIKACIN,  in the last 72 hours   Microbiology: Recent Results (from the past 720 hour(s))  MRSA PCR SCREENING     Status: None   Collection Time    02/08/14 11:57 AM      Result Value Ref Range Status   MRSA by PCR NEGATIVE  NEGATIVE Final   Comment:            The GeneXpert MRSA Assay (FDA     approved for NASAL specimens     only), is one component of a     comprehensive MRSA colonization     surveillance program. It is not     intended to diagnose MRSA     infection nor to guide or     monitor treatment for     MRSA infections.    Medical History: Past Medical History   Diagnosis Date  . Ischemic cardiomyopathy     a. 10/09 Echo: Sev LV Dysfxn, inf/lat [place], Mod MR.  [name];  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 Hypertension   . PVC (premature ventricular contraction)   . Pacemaker   . CHF (congestive heart failure)   . Shortness of breath   . Automatic implantable cardioverter-defibrillator in situ      Assessment:  72yom admitted after respiratory distress in cath lab and intubated.  Chest tube placement was attempted but no hemothorax so not placed.  Broad spectrum ABX started for infection prevention. Now extubated, no s/s infection, afebrile, wbc wnl, Cr stable at 1.   Goal of Therapy:  Vancomycin trough level 15-20 mcg/ml  Plan:  Vancomycin 1gm x1 then 500mg  q12 - d/c after last doses today Ceftazidime 1gm q12 - d/c  after last doses today  Bonnita Nasuti Pharm.D. CPP, BCPS Clinical Pharmacist [phone] 02/10/2014 10:38 AM     Nadine Counts 02/10/2014,10:38 AM

## 2014-02-10 NOTE — Progress Notes (Signed)
[facility] ADULT ICU REPLACEMENT PROTOCOL FOR AM LAB REPLACEMENT ONLY  The patient does  apply for the [facility] Adult ICU Electrolyte Replacment Protocol based on the criteria listed below:   1. Is GFR >/= 40 ml/min? yes  Patient's GFR today is 74 2. Is urine output >/= 0.5 ml/kg/hr for the last 6 hours? yes Patient's UOP is 1.49 ml/kg/hr 3. Is BUN < 60 mg/dL? yes  Patient's BUN today is 16 4. Abnormal electrolyte(s): k+ 3.6 5. Ordered repletion with: see order 6. If a panic level lab has been reported, has the CCM MD in charge been notified? yes.   Physician:  Dr. Madelin Headings, [place] Linhart A 02/10/2014 6:36 AM

## 2014-02-10 NOTE — Progress Notes (Signed)
PCCM Interval Note  Tolerated extubation. No further hemoptysis.   Please call us if we can help in any way.   Baltazar Apo, MD, PhD 02/10/2014, 8:56 AM [facility] [phone] or if no answer [phone]

## 2014-02-10 NOTE — Progress Notes (Signed)
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   Admitted 3/4-3/25/15 with SOB and syncopal episode. Found to have SBO and NG placed and started on TPN. On 11/06/13 developed progressive SOB and hypotension and co-ox 51% and Hgb 6.7. Started on milrinone and transfused. ECHo showed nl RV function. Patient had PEA arrest and was intubated and started on pressors. CXR revelaed a R hemothorax and a R CT placed. He required VATs and evacuation of hemothorax and then tansferred to CIR.   Patient saw Dr. Rayann Heman 01/24/14 for device follow up. Per note: pt is stable and on an appropriate medical regimen. Normal ICD function. R waves have decreased since LVAD placement; CXR revealed stable lead position. He plans to follow clinically and try to avoid lead revision if possible.  Planned RHC 6/11 for transplant workup. Patient was stuck under [place] and then developed hemoptysis. There was concern for hemothorax and PVT called to bedside to emergent attempt at CT, however no pleural fluid. He was intubated and transferred to ICU.   Extubated yesterday am. Tolerating diet. MAPs 90-100s. VAD speed turned up to 9400 yesterday. Cr and Hgb stable. Denies any SOB.    LVAD INTERROGATION:  HeartMate II LVAD:  Flow 3.9 liters/min, speed 9400, power 4.9, PI 7.9.    Objective:    Vital Signs:   Temp:  [98.3 F (36.8 C)-99.1 F (37.3 C)] 98.3 F (36.8 C) (06/13 0000) Pulse Rate:  [75-92] 75 (06/13 0600) Resp:  [0-22] 11 (06/13 0500) SpO2:  [89 %-100 %] 95 % (06/13 0600) Arterial Line BP: (88-117)/(72-88) 109/80 mmHg (06/13 0600) FiO2 (%):  [40 %] 40 % (06/12 0900) Weight:  [148 lb 9.4 oz (67.4 kg)] 148 lb 9.4 oz (67.4 kg) (06/13 0500) Last BM Date: 02/08/14 Mean arterial Pressure 90-100s  Intake/Output:   Intake/Output Summary (Last 24 hours) at 02/10/14  0705 Last data filed at 02/10/14 0600  Gross per 24 hour  Intake  620.4 ml  Output   1490 ml  Net -869.6 ml     Physical Exam: General:  Well appearing, sitting up in chair HEENT: normal Neck: supple. JVP 7 . Carotids 2+ bilat; no bruits. No lymphadenopathy or thryomegaly appreciated. Cor: Mechanical heart sounds with LVAD hum present. Lungs: clear, blisters from tape on R flank Abdomen: soft, nontender, nondistended. No hepatosplenomegaly. No bruits or masses. Good bowel sounds. Driveline: C/D/I; securement device intact and driveline incorporated Extremities: no cyanosis, clubbing, rash, edema Neuro: alert & orientedx3, cranial nerves grossly intact. moves all 4 extremities w/o difficulty. Affect pleasant  Telemetry: SR 80s  Labs: Basic Metabolic Panel:  Recent Labs Lab 02/05/14 0832 02/08/14 1045 02/09/14 0400 02/10/14 0420  NA 143 141 145 141  K 3.8 2.8* 3.9 3.6*  CL 102 101 108 104  CO2 29 22 22 24   GLUCOSE 94 137* 90 98  BUN 13 19 16 16   CREATININE 1.38* 1.28 1.09 1.00  CALCIUM 9.4 8.5 8.9 8.6    Liver Function Tests:  Recent Labs Lab 02/05/14 0832 02/09/14 0400  AST 43* 16  ALT 75* 24  ALKPHOS 171* 108  BILITOT 1.2 1.0  PROT 7.9 6.5  ALBUMIN 3.7 3.1*   No results found for this basename: LIPASE, AMYLASE,  in the last 168 hours No results found for this basename: AMMONIA,  in the last 168 hours  CBC:  Recent Labs Lab 02/05/14 0832 02/08/14 1045 02/08/14 1635 02/09/14 0400 02/10/14 0420  WBC 7.5 7.7 5.7 6.2 6.9  NEUTROABS  --  6.7  --  5.4 5.3  HGB 12.7* 9.7* 8.7* 9.1* 9.3*  HCT 40.0 30.7* 27.9* 29.3* 30.2*  MCV 87.1 88.7 87.7 88.0 90.1  PLT 234 172 167 179 177    INR:  Recent Labs Lab 02/05/14 0832 02/08/14 0647 02/09/14 0400 02/10/14 0420  INR 3.43* 2.85* 2.23* 2.05*   Imaging: Dg Chest Port 1 View  02/09/2014   CLINICAL DATA:  Hemothorax evacuation  EXAM: PORTABLE CHEST - 1 VIEW  COMPARISON:  February 08, 2014  FINDINGS:  Endotracheal tube tip is 5.1 cm above the carina. Nasogastric tube tip and side port are below the diaphragm. Pacemaker leads are attached to the right atrium and right ventricle. There is no demonstrable pneumothorax.  There is a minimal pleural effusion on the right. Lungs elsewhere are clear. Heart is upper normal in size with normal pulmonary vascularity. No adenopathy. Left ventricular assist device is present.  IMPRESSION: Tube positions as described without pneumothorax. Small right effusion. No edema or consolidation.   Electronically Signed   By: Lowella Grip M.D.   On: 02/09/2014 08:08   Dg Chest Port 1 View  02/08/2014   CLINICAL DATA:  Hemothorax  EXAM: PORTABLE CHEST - 1 VIEW  COMPARISON:  Film from earlier in the same day.  FINDINGS: Pacing device is again seen. Left ventricular assist device is again noted. The nasogastric catheter remains in the stomach. An endotracheal tube is again seen 3 cm above the carina. The lungs are again well aerated with very minimal left basilar atelectasis. The cardiac shadow is stable.  IMPRESSION: Minimal left basilar atelectasis.   Electronically Signed   By: Inez Catalina M.D.   On: 02/08/2014 19:29   Dg Chest Port 1 View  02/08/2014   CLINICAL DATA:  Hemothorax  EXAM: PORTABLE CHEST - 1 VIEW  COMPARISON:  Portable exam 1357 hr compared to 02/08/2014  FINDINGS: Tip of endotracheal tube projects approximately 5.2 cm above carina.  LEFT subclavian AICD leads project over RIGHT atrium RIGHT ventricle.  Nasogastric tube extends into stomach.  LEFT ventricular assist device again identified.  Enlargement of cardiac silhouette with pulmonary vascular congestion post median sternotomy.  Slight pulmonary vascular congestion.  Increased opacity peripherally in lower RIGHT hemi thorax question infiltrate versus loculated pleural fluid.  Remaining lungs clear.  No pneumothorax.  IMPRESSION: Postoperative changes as above with increased opacity laterally in the lower  RIGHT hemi thorax question increased infiltrate versus loculated pleural fluid collection.   Electronically Signed   By: Lavonia Dana M.D.   On: 02/08/2014 14:20   Dg Chest Portable 1 View  02/08/2014   CLINICAL DATA:  Endotracheal tube replacement  EXAM: PORTABLE CHEST - 1 VIEW  COMPARISON:  Portable chest x-ray of February 08, 2014 at 9:17 a.m.  FINDINGS:  The endotracheal tube tip now lies approximately 6 cm above the crotch of the carina. The lungs are well-expanded. The interstitial markings in the right lung are slightly less conspicuous currently. The permanent pacemaker defibrillator is unchanged in position. The left ventricular assist device is unchanged in position.  IMPRESSION: The endotracheal tube is in appropriate position. Mild improvement in the pulmonary interstitium on the right has occurred.   Electronically Signed   By: David  Martinique   On: 02/08/2014 11:06   Dg Chest Port 1 View  02/08/2014   CLINICAL  DATA:  Intubation.  Hemoptysis  EXAM: PORTABLE CHEST - 1 VIEW  COMPARISON:  11/21/2013  FINDINGS: Endotracheal tube at the carina directed towards the right main bronchus. Recommend withdrawal 3 cm.  Right upper lobe infiltrate may represent hemorrhage or edema. Mild vascular congestion is present. Pleural thickening bilaterally unchanged. Left ventricular assist device noted. AICD noted.  IMPRESSION: Endotracheal to in the proximal right main bronchus. Recommend withdrawal 3 cm.  Pulmonary vascular congestion. Right upper lobe infiltrate may represent hemorrhage given the history of hemoptysis. Edema also possible.  These results were called by telephone at the time of interpretation on 02/08/2014 at 9:34 AM to Dr. Haroldine Laws, who verbally acknowledged these results.   Electronically Signed   By: Franchot Gallo M.D.   On: 02/08/2014 09:35     Medications:     Scheduled Medications: . antiseptic oral rinse  15 mL Mouth Rinse QID  . aspirin  325 mg Oral Daily  . cefTAZidime (FORTAZ)  IV  1  g Intravenous Q12H  . chlorhexidine  15 mL Mouth Rinse BID  . citalopram  10 mg Oral Daily  . ezetimibe  10 mg Oral Daily  . ferrous Q000111Q C-folic acid  1 capsule Oral Daily  . furosemide  20 mg Oral Daily  . hydrALAZINE  25 mg Oral TID  . levothyroxine  175 mcg Oral QAC breakfast  . pantoprazole  40 mg Oral Daily  . potassium chloride  20 mEq Oral Q4H  . rosuvastatin  20 mg Oral q1800  . temazepam  15 mg Oral QHS  . vancomycin  500 mg Intravenous Q12H    Infusions: . dextrose 5 % and 0.9% NaCl Stopped (02/09/14 1900)    PRN Medications: albuterol, fentaNYL, hydrALAZINE, menthol-cetylpyridinium   Assessment:   1) Suspected hemothorax 2) Acute respiratory failure - intubated 3) Chronic systolic HF - s/p LVAD  4) HTN  Plan/Discussion:    Mr. Beymer is known to the HF team and presented today for schedule RHC to assess hemodynamics for transplant evaluation. Ultrasound was used on RIJ however patient started coughing and developed hemoptysis. Concern for hemothorax and PVT called to bedside. CT attempted but no evidence of bleeding. Pt intubated and started on diprivan. No pneumothorax or hemothorax. CXR with pulmonary hemorrhage.  Extubated yesterday and stable overnight. LVAD speed turned up from 9200 to 9400 which he tolerated and pro-BNP much improved. PI remains elevated and could probably turn up to 9600 but want to be careful with RV. Can adjust speed Monday in the cath lab while assess hemodynamics. INR therapeutic will have pharmacy dose coumadin, keep on lower side. Hgb stable. Cath Monday through R groin and will pre-medicate patient before with some anxiolytics.   MAPs remain elevated will keep aline in as we adjust antihypertensives. Patient has had issues with extreme HTN in the past. Will increase hydralazine to 37.5 mg TID. He did not tolerate ACE-I or amlodopine. Volume status stable will continue to hold diuretics. K+ supplemented.   Will leave in  ICU one more day and then if any beds available could move to step-down tomorrow.   Ambulate in the halls.   I reviewed the LVAD parameters from today, and compared the results to the patient's prior recorded data.  No programming changes were made.  The LVAD is functioning within specified parameters.  The patient performs LVAD self-test daily.  LVAD interrogation was negative for any significant power changes, alarms or PI events/speed drops.  LVAD equipment check completed and is  in good working order.  Back-up equipment present.   LVAD education done on emergency procedures and precautions and reviewed exit site care.  Length of Stay: 2  Rande Brunt NP-C 02/10/2014, 7:05 AM  VAD Team --- VAD ISSUES ONLY--- Pager [phone] (7am - 7am)  Advanced Heart Failure Team  Pager [phone] (M-F; 7a - 4p)  Please contact [facility] for night-coverage after hours (4p -7a ) and weekends on [email or web address]  Patient seen and examined with Junie Bame, NP. We discussed all aspects of the encounter. I agree with the assessment and plan as stated above.   He looks great today. HGb stable. He has ambulated. No further hemoptysis. CXR is fine. INR 2.1. Back on coumadin. MAPs are stable with increased dose hydralazine. Possibly to 2W tomorrow. Plan RHC from groin Monday. VAD parameters look good.   Daniel Bensimhon,MD 10:05 PM

## 2014-02-10 NOTE — Progress Notes (Signed)
CARDIAC REHAB PHASE I   PRE:  Rate/Rhythm: 85 SR  BP:  Supine:   Sitting: 100/76 Aline  Standing:    SaO2: 97 RA  MODE:  Ambulation:  ft   POST:  Rate/Rhythm:   BP:  Supine:   Sitting:   Standing:    SaO2:  1500-1520  Got pt ready to ambulate. He then realized that he did not have his fanny pack her to carry his batteries. Pt call ed his wife and ask her to bring it to the hospital. Pt's nurse states that she will walk him when his wife arrives.  Rodney Langton RN 02/10/2014 3:31 PM

## 2014-02-10 NOTE — Progress Notes (Signed)
ANTICOAGULATION CONSULT NOTE - Initial Consult  Pharmacy Consult for Coumadin Indication: LVAD  Allergies  Allergen Reactions  . Ace Inhibitors     Hypotension and elevated creatinine  . Diovan [Valsartan] Other (See Comments)    Hypotension at low dose, hyperkalemia  . Lipitor [Atorvastatin Calcium] Other (See Comments)    Muscle pain  . Norvasc [Amlodipine] Other (See Comments)    Put patient to sleep    Patient Measurements: Height: 5\' 7"  (170.2 cm) Weight: 148 lb 9.4 oz (67.4 kg) IBW/kg (Calculated) : 66.1   Vital Signs: Temp: 97.8 F (36.6 C) (06/13 0755) Temp src: Oral (06/13 0755) Pulse Rate: 86 (06/13 0900)  Labs:  Recent Labs  02/08/14 0647  02/08/14 1045 02/08/14 1635 02/09/14 0400 02/10/14 0420  HGB  --   < > 9.7* 8.7* 9.1* 9.3*  HCT  --   < > 30.7* 27.9* 29.3* 30.2*  PLT  --   < > 172 167 179 177  LABPROT 28.9*  --   --   --  24.0* 22.5*  INR 2.85*  --   --   --  2.23* 2.05*  CREATININE  --   --  1.28  --  1.09 1.00  < > = values in this interval not displayed.  Estimated Creatinine Clearance: 64.3 ml/min (by C-G formula based on Cr of 1).   Medical History: Past Medical History  Diagnosis Date  . Ischemic cardiomyopathy     a. 10/09 Echo: Sev LV Dysfxn, inf/lat AK, Mod MR.  [name];  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 Hypertension   . PVC (premature ventricular contraction)   . Pacemaker   . CHF (congestive heart failure)   . Shortness of breath   . Automatic implantable cardioverter-defibrillator in situ      Assessment: 72yom with HF s/p LVAD placed, admitted after attempted RHC.  On chronic  anticoagulation for LVAD on warfarin 5mg  daily PTA.  INR 2.8 on admission but held and slightly reversed with FFP after attempted RHC.  INR now 2.05 - remains at goal 2-3 but suspect it will continue to drop.  CBC stable, no bleeding noted.   Plan for [place] on Monday with plan to keep INR 2-2.5 until then.    Goal of Therapy:  INR 2-3 Monitor platelets by anticoagulation protocol: Yes   Plan:  Coumadin 7.5mg  x1 Daily INR, CBC  Bonnita Nasuti Pharm.D. CPP, BCPS Clinical Pharmacist [phone] 02/10/2014 10:40 AM

## 2014-02-11 LAB — CBC WITH DIFFERENTIAL/PLATELET
Basophils Absolute: 0 10*3/uL (ref 0.0–0.1)
Basophils Relative: 0 % (ref 0–1)
Eosinophils Absolute: 0.3 10*3/uL (ref 0.0–0.7)
Eosinophils Relative: 4 % (ref 0–5)
HCT: 30.7 % — ABNORMAL LOW (ref 39.0–52.0)
Hemoglobin: 9.2 g/dL — ABNORMAL LOW (ref 13.0–17.0)
Lymphocytes Relative: 14 % (ref 12–46)
Lymphs Abs: 0.9 10*3/uL (ref 0.7–4.0)
MCH: 26.8 pg (ref 26.0–34.0)
MCHC: 30 g/dL (ref 30.0–36.0)
MCV: 89.5 fL (ref 78.0–100.0)
Monocytes Absolute: 0.5 10*3/uL (ref 0.1–1.0)
Monocytes Relative: 7 % (ref 3–12)
Neutro Abs: 4.8 10*3/uL (ref 1.7–7.7)
Neutrophils Relative %: 75 % (ref 43–77)
Platelets: 194 10*3/uL (ref 150–400)
RBC: 3.43 MIL/uL — ABNORMAL LOW (ref 4.22–5.81)
RDW: 19.5 % — ABNORMAL HIGH (ref 11.5–15.5)
WBC: 6.4 10*3/uL (ref 4.0–10.5)

## 2014-02-11 LAB — BASIC METABOLIC PANEL
BUN: 17 mg/dL (ref 6–23)
CO2: 26 mEq/L (ref 19–32)
Calcium: 8.6 mg/dL (ref 8.4–10.5)
Chloride: 106 mEq/L (ref 96–112)
Creatinine, Ser: 1.02 mg/dL (ref 0.50–1.35)
GFR calc Af Amer: 84 mL/min — ABNORMAL LOW (ref >90)
GFR calc non Af Amer: 72 mL/min — ABNORMAL LOW (ref >90)
Glucose, Bld: 101 mg/dL — ABNORMAL HIGH (ref 70–99)
Potassium: 3.6 mEq/L — ABNORMAL LOW (ref 3.7–5.3)
Sodium: 142 mEq/L (ref 137–147)

## 2014-02-11 LAB — PROTIME-INR
INR: 1.91 — ABNORMAL HIGH (ref 0.00–1.49)
Prothrombin Time: 21.3 seconds — ABNORMAL HIGH (ref 11.6–15.2)

## 2014-02-11 MED ORDER — SODIUM CHLORIDE 0.9 % IJ SOLN
3.0000 mL | INTRAMUSCULAR | Status: DC | PRN
Start: 2014-02-11 — End: 2014-02-12
  Administered 2014-02-11: 3 mL via INTRAVENOUS

## 2014-02-11 MED ORDER — ASPIRIN 81 MG PO CHEW
81.0000 mg | CHEWABLE_TABLET | ORAL | Status: AC
  Administered 2014-02-12: 81 mg via ORAL
  Filled 2014-02-11: qty 1

## 2014-02-11 MED ORDER — HYDRALAZINE HCL 25 MG PO TABS
25.0000 mg | ORAL_TABLET | Freq: Four times a day (QID) | ORAL | Status: DC
  Filled 2014-02-11 (×4): qty 1

## 2014-02-11 MED ORDER — POTASSIUM CHLORIDE CRYS ER 20 MEQ PO TBCR
40.0000 meq | EXTENDED_RELEASE_TABLET | Freq: Once | ORAL | Status: AC
  Administered 2014-02-11: 40 meq via ORAL
  Filled 2014-02-11: qty 2

## 2014-02-11 MED ORDER — POLYETHYLENE GLYCOL 3350 17 G PO PACK
17.0000 g | PACK | Freq: Every day | ORAL | Status: DC
  Administered 2014-02-11 – 2014-02-12 (×2): 17 g via ORAL
  Filled 2014-02-11 (×2): qty 1

## 2014-02-11 MED ORDER — DIAZEPAM 5 MG PO TABS
5.0000 mg | ORAL_TABLET | Freq: Three times a day (TID) | ORAL | Status: DC | PRN
  Filled 2014-02-11: qty 1

## 2014-02-11 MED ORDER — WARFARIN SODIUM 5 MG PO TABS
5.0000 mg | ORAL_TABLET | Freq: Every day | ORAL | Status: DC
  Administered 2014-02-11: 5 mg via ORAL
  Filled 2014-02-11 (×2): qty 1

## 2014-02-11 MED ORDER — SODIUM CHLORIDE 0.9 % IV SOLN: 250.0000 mL | INTRAVENOUS | Status: DC | PRN

## 2014-02-11 MED ORDER — SODIUM CHLORIDE 0.9 % IJ SOLN: 3.0000 mL | Freq: Two times a day (BID) | INTRAMUSCULAR | Status: DC

## 2014-02-11 MED ORDER — HYDRALAZINE HCL 25 MG PO TABS
25.0000 mg | ORAL_TABLET | Freq: Four times a day (QID) | ORAL | Status: DC
  Administered 2014-02-11 – 2014-02-12 (×5): 25 mg via ORAL
  Filled 2014-02-11 (×8): qty 1

## 2014-02-11 NOTE — Progress Notes (Addendum)
ANTICOAGULATION CONSULT NOTE - Initial Consult  Pharmacy Consult for Coumadin Indication: LVAD  Allergies  Allergen Reactions  . Ace Inhibitors     Hypotension and elevated creatinine  . Diovan [Valsartan] Other (See Comments)    Hypotension at low dose, hyperkalemia  . Lipitor [Atorvastatin Calcium] Other (See Comments)    Muscle pain  . Norvasc [Amlodipine] Other (See Comments)    Put patient to sleep    Patient Measurements: Height: 5\' 7"  (170.2 cm) Weight: 147 lb 11.3 oz (67 kg) IBW/kg (Calculated) : 66.1   Vital Signs: Temp: 97.5 F (36.4 C) (06/14 0729) Temp src: Oral (06/14 0729) Pulse Rate: 81 (06/14 1100)  Labs:  Recent Labs  02/09/14 0400 02/10/14 0420 02/11/14 0345  HGB 9.1* 9.3* 9.2*  HCT 29.3* 30.2* 30.7*  PLT 179 177 194  LABPROT 24.0* 22.5* 21.3*  INR 2.23* 2.05* 1.91*  CREATININE 1.09 1.00 1.02    Estimated Creatinine Clearance: 63 ml/min (by C-G formula based on Cr of 1.02).   Medical History: Past Medical History  Diagnosis Date  . Ischemic cardiomyopathy     a. 10/09 Echo: Sev LV Dysfxn, inf/lat AK, Mod MR.  [name];  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 Hypertension   . PVC (premature ventricular contraction)   . Pacemaker   . CHF (congestive heart failure)   . Shortness of breath   . Automatic implantable cardioverter-defibrillator in situ      Assessment: Johnny Oneill with HF s/p LVAD placed, admitted after attempted RHC.  On chronic anticoagulation for LVAD on warfarin 5mg  daily PTA.  INR 2.8 on admission but held and slightly reversed with FFP after attempted RHC.   Coumadin restarted last pm   INR 2.05  > 1.9 after 7.5mg  x1.  Slightly less than goal 2 -2.5 for RHC Monday.  CBC stable, no bleeding noted.  MAP 80-100 on hydralazine, SR 70-80s Goal of Therapy:  INR 2-3 Monitor platelets by anticoagulation protocol: Yes   Plan:  Coumadin 5mg  daily  Daily INR, CBC  Bonnita Nasuti Pharm.D. CPP, BCPS Clinical Pharmacist [phone] 02/11/2014 11:45 AM

## 2014-02-11 NOTE — Progress Notes (Addendum)
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   Admitted 3/4-3/25/15 with SOB and syncopal episode. Found to have SBO and NG placed and started on TPN. On 11/06/13 developed progressive SOB and hypotension and co-ox 51% and Hgb 6.7. Started on milrinone and transfused. ECHo showed nl RV function. Patient had PEA arrest and was intubated and started on pressors. CXR revelaed a R hemothorax and a R CT placed. He required VATs and evacuation of hemothorax and then tansferred to CIR.   Patient saw Dr. Rayann Heman 01/24/14 for device follow up. Per note: pt is stable and on an appropriate medical regimen. Normal ICD function. R waves have decreased since LVAD placement; CXR revealed stable lead position. He plans to follow clinically and try to avoid lead revision if possible.  Planned RHC 6/11 for transplant workup. Patient developed hemoptysis with attempted RIJ access. There was concern for hemothorax and Dr. Prescott Gum called to bedside to emergent attempt at CT, however no pleural fluid. He was intubated and transferred to ICU. Now extubated.  Sitting in chair. Feels great. Voice still hoarse. MAPs drop to 70s after hydralazine dose then creeps up to over 100 prior to next dose. No SOB/CP.    LVAD INTERROGATION:  HeartMate II LVAD:  Flow 4.2 liters/min, speed 9200, power 5.0  PI 7.8.  Occasional PI events.   Objective:    Vital Signs:   Temp:  [97.5 F (36.4 C)-98.7 F (37.1 C)] 97.5 F (36.4 C) (06/14 0729) Pulse Rate:  [71-87] 81 (06/14 1100) Resp:  [9-25] 18 (06/14 1100) SpO2:  [93 %-98 %] 98 % (06/14 1100) Arterial Line BP: (92-125)/(71-91) 119/88 mmHg (06/14 1100) Weight:  [67 kg (147 lb 11.3 oz)] 67 kg (147 lb 11.3 oz) (06/14 0600) Last BM Date: 02/08/14 Mean arterial Pressure 70-100s  Intake/Output:   Intake/Output Summary (Last  24 hours) at 02/11/14 1206 Last data filed at 02/11/14 1100  Gross per 24 hour  Intake   1055 ml  Output    895 ml  Net    160 ml     Physical Exam: General:  Well appearing, sitting up in chair HEENT: normal Neck: supple. JVP 7 . Carotids 2+ bilat; no bruits. No lymphadenopathy or thryomegaly appreciated. Cor: Mechanical heart sounds with LVAD hum present. Lungs: clear,  Abdomen: soft, nontender, nondistended. No hepatosplenomegaly. No bruits or masses. Good bowel sounds. Driveline: C/D/I; securement device intact and driveline incorporated Extremities: no cyanosis, clubbing, rash, edema Neuro: alert & orientedx3, cranial nerves grossly intact. moves all 4 extremities w/o difficulty. Affect pleasant  Telemetry: SR 80s  Labs: Basic Metabolic Panel:  Recent Labs Lab 02/05/14 0832 02/08/14 1045 02/09/14 0400 02/10/14 0420 02/11/14 0345  NA 143 141 145 141 142  K 3.8 2.8* 3.9 3.6* 3.6*  CL 102 101 108 104 106  CO2 29 22 22 24 26   GLUCOSE 94 137* 90 98 101*  BUN 13 19 16 16 17   CREATININE 1.38* 1.28 1.09 1.00 1.02  CALCIUM 9.4 8.5 8.9 8.6 8.6    Liver Function Tests:  Recent Labs Lab 02/05/14 0832 02/09/14 0400  AST 43* 16  ALT 75* 24  ALKPHOS 171* 108  BILITOT 1.2 1.0  PROT 7.9 6.5  ALBUMIN 3.7 3.1*   No results found for this basename: LIPASE, AMYLASE,  in the last 168 hours No results  found for this basename: AMMONIA,  in the last 168 hours  CBC:  Recent Labs Lab 02/08/14 1045 02/08/14 1635 02/09/14 0400 02/10/14 0420 02/11/14 0345  WBC 7.7 5.7 6.2 6.9 6.4  NEUTROABS 6.7  --  5.4 5.3 4.8  HGB 9.7* 8.7* 9.1* 9.3* 9.2*  HCT 30.7* 27.9* 29.3* 30.2* 30.7*  MCV 88.7 87.7 88.0 90.1 89.5  PLT 172 167 179 177 194    INR:  Recent Labs Lab 02/05/14 0832 02/08/14 0647 02/09/14 0400 02/10/14 0420 02/11/14 0345  INR 3.43* 2.85* 2.23* 2.05* 1.91*   Imaging: Dg Chest Port 1 View  02/10/2014   CLINICAL DATA:  Followup right effusion  EXAM:  PORTABLE CHEST - 1 VIEW  COMPARISON:  02/09/2014  FINDINGS: There is a left chest wall pacer device with lead in the right atrial appendage and right ventricle. The patient is status post median sternotomy and CABG procedure. Implantable left ventricular assist device is identified. Small partially loculated right pleural effusion is unchanged from previous exam.  IMPRESSION: 1. Cardiac enlargement. 2. Stable small partially loculated right pleural effusion.   Electronically Signed   By: Kerby Moors M.D.   On: 02/10/2014 08:04     Medications:     Scheduled Medications: . antiseptic oral rinse  15 mL Mouth Rinse QID  . aspirin EC  325 mg Oral Daily  . chlorhexidine  15 mL Mouth Rinse BID  . citalopram  10 mg Oral Daily  . ezetimibe  10 mg Oral Daily  . ferrous Q000111Q C-folic acid  1 capsule Oral Daily  . furosemide  20 mg Oral Daily  . hydrALAZINE  37.5 mg Oral 3 times per day  . levothyroxine  175 mcg Oral QAC breakfast  . pantoprazole  40 mg Oral Daily  . rosuvastatin  20 mg Oral q1800  . sodium chloride  3 mL Intravenous Q12H  . temazepam  15 mg Oral QHS  . warfarin  5 mg Oral q1800  . Warfarin - Pharmacist Dosing Inpatient   Does not apply q1800    Infusions: . dextrose 5 % and 0.9% NaCl Stopped (02/09/14 1900)    PRN Medications: sodium chloride, albuterol, fentaNYL, hydrALAZINE, menthol-cetylpyridinium, sodium chloride   Assessment:   1) Suspected hemothorax 2) Acute respiratory failure - intubated 3) Chronic systolic HF - s/p LVAD  4) HTN  Plan/Discussion:    Doing well. Will spread out hydralazine to 25 q6 to see if we can keep MAPs more stable. Continue to ambulate.  Plan RHC through groin in am.   Ambulate in the halls. Miralax for constipation.   I reviewed the LVAD parameters from today, and compared the results to the patient's prior recorded data.  No programming changes were made.  The LVAD is functioning within specified parameters.   The patient performs LVAD self-test daily.  LVAD interrogation was negative for any significant power changes, alarms or PI events/speed drops.  LVAD equipment check completed and is in good working order.  Back-up equipment present.   LVAD education done on emergency procedures and precautions and reviewed exit site care.  Length of Stay: 3  Glori Bickers MD  02/11/2014, 12:06 PM  VAD Team --- VAD ISSUES ONLY--- Pager [phone] (7am - 7am)  Advanced Heart Failure Team  Pager [phone] (M-F; 7a - 4p)  Please contact [facility] for night-coverage after hours (4p -7a ) and weekends on [email or web address]

## 2014-02-12 ENCOUNTER — Encounter (HOSPITAL_COMMUNITY)
Admission: RE | Disposition: A | Discharge status: Home / Self Care | Payer: Self-pay | Source: Ambulatory Visit | Attending: Internal Medicine

## 2014-02-12 ENCOUNTER — Ambulatory Visit (HOSPITAL_COMMUNITY): Admit: 2014-02-12 | Payer: Self-pay | Admitting: Internal Medicine

## 2014-02-12 DIAGNOSIS — I509 Heart failure, unspecified: Secondary | ICD-10-CM

## 2014-02-12 HISTORY — PX: RIGHT HEART CATHETERIZATION: SHX5447

## 2014-02-12 LAB — TYPE AND SCREEN
ABO/RH(D): O NEG
Antibody Screen: NEGATIVE
Unit division: 0
Unit division: 0

## 2014-02-12 LAB — LACTATE DEHYDROGENASE: LDH: 226 U/L (ref 94–250)

## 2014-02-12 LAB — BASIC METABOLIC PANEL
BUN: 15 mg/dL (ref 6–23)
CO2: 25 mEq/L (ref 19–32)
Calcium: 9 mg/dL (ref 8.4–10.5)
Chloride: 99 mEq/L (ref 96–112)
Creatinine, Ser: 1.03 mg/dL (ref 0.50–1.35)
GFR calc Af Amer: 83 mL/min — ABNORMAL LOW (ref >90)
GFR calc non Af Amer: 72 mL/min — ABNORMAL LOW (ref >90)
Glucose, Bld: 95 mg/dL (ref 70–99)
Potassium: 3.9 mEq/L (ref 3.7–5.3)
Sodium: 137 mEq/L (ref 137–147)

## 2014-02-12 LAB — CBC WITH DIFFERENTIAL/PLATELET
Basophils Absolute: 0 10*3/uL (ref 0.0–0.1)
Basophils Relative: 0 % (ref 0–1)
Eosinophils Absolute: 0.2 10*3/uL (ref 0.0–0.7)
Eosinophils Relative: 3 % (ref 0–5)
HCT: 32 % — ABNORMAL LOW (ref 39.0–52.0)
Hemoglobin: 9.9 g/dL — ABNORMAL LOW (ref 13.0–17.0)
Lymphocytes Relative: 14 % (ref 12–46)
Lymphs Abs: 0.9 10*3/uL (ref 0.7–4.0)
MCH: 27.4 pg (ref 26.0–34.0)
MCHC: 30.9 g/dL (ref 30.0–36.0)
MCV: 88.6 fL (ref 78.0–100.0)
Monocytes Absolute: 0.6 10*3/uL (ref 0.1–1.0)
Monocytes Relative: 8 % (ref 3–12)
Neutro Abs: 5.1 10*3/uL (ref 1.7–7.7)
Neutrophils Relative %: 75 % (ref 43–77)
Platelets: 243 10*3/uL (ref 150–400)
RBC: 3.61 MIL/uL — ABNORMAL LOW (ref 4.22–5.81)
RDW: 19.3 % — ABNORMAL HIGH (ref 11.5–15.5)
WBC: 6.9 10*3/uL (ref 4.0–10.5)

## 2014-02-12 LAB — POCT I-STAT 3, ART BLOOD GAS (G3+)
Bicarbonate: 24.1 mEq/L — ABNORMAL HIGH (ref 20.0–24.0)
O2 Saturation: 69 %
TCO2: 25 mmol/L (ref 0–100)
pCO2 arterial: 37.6 mmHg (ref 35.0–45.0)
pH, Arterial: 7.414 (ref 7.350–7.450)
pO2, Arterial: 36 mmHg — CL (ref 80.0–100.0)

## 2014-02-12 LAB — PROTIME-INR
INR: 1.9 — ABNORMAL HIGH (ref 0.00–1.49)
Prothrombin Time: 21.2 seconds — ABNORMAL HIGH (ref 11.6–15.2)

## 2014-02-12 LAB — POCT I-STAT 3, VENOUS BLOOD GAS (G3P V)
Bicarbonate: 24.6 mEq/L — ABNORMAL HIGH (ref 20.0–24.0)
O2 Saturation: 64 %
TCO2: 26 mmol/L (ref 0–100)
pCO2, Ven: 38.1 mmHg — ABNORMAL LOW (ref 45.0–50.0)
pH, Ven: 7.418 — ABNORMAL HIGH (ref 7.250–7.300)
pO2, Ven: 32 mmHg (ref 30.0–45.0)

## 2014-02-12 SURGERY — RIGHT HEART CATH
Anesthesia: LOCAL

## 2014-02-12 MED ORDER — SODIUM CHLORIDE 0.9 % IJ SOLN: 3.0000 mL | INTRAMUSCULAR | Status: DC | PRN

## 2014-02-12 MED ORDER — DIAZEPAM 5 MG PO TABS
2.5000 mg | ORAL_TABLET | Freq: Once | ORAL | Status: AC
  Administered 2014-02-12: 2.5 mg via ORAL

## 2014-02-12 MED ORDER — HYDRALAZINE HCL 25 MG PO TABS: 25.0000 mg | ORAL_TABLET | Freq: Four times a day (QID) | ORAL | Status: DC

## 2014-02-12 MED ORDER — NITROGLYCERIN 0.2 MG/ML ON CALL CATH LAB
INTRAVENOUS | Status: AC
  Filled 2014-02-12: qty 1

## 2014-02-12 MED ORDER — LIDOCAINE HCL (PF) 1 % IJ SOLN
INTRAMUSCULAR | Status: AC
  Filled 2014-02-12: qty 30

## 2014-02-12 MED ORDER — FENTANYL CITRATE 0.05 MG/ML IJ SOLN
INTRAMUSCULAR | Status: AC
  Filled 2014-02-12: qty 2

## 2014-02-12 MED ORDER — WARFARIN SODIUM 7.5 MG PO TABS
7.5000 mg | ORAL_TABLET | Freq: Once | ORAL | Status: DC
  Filled 2014-02-12: qty 1

## 2014-02-12 MED ORDER — SODIUM CHLORIDE 0.9 % IV SOLN: 250.0000 mL | INTRAVENOUS | Status: DC | PRN

## 2014-02-12 MED ORDER — MIDAZOLAM HCL 2 MG/2ML IJ SOLN
INTRAMUSCULAR | Status: AC
  Filled 2014-02-12: qty 2

## 2014-02-12 MED ORDER — SODIUM CHLORIDE 0.9 % IJ SOLN: 3.0000 mL | Freq: Two times a day (BID) | INTRAMUSCULAR | Status: DC

## 2014-02-12 MED ORDER — HEPARIN (PORCINE) IN NACL 2-0.9 UNIT/ML-% IJ SOLN
INTRAMUSCULAR | Status: AC
  Filled 2014-02-12: qty 1000

## 2014-02-12 NOTE — CV Procedure (Signed)
Cardiac Cath Procedure Note:  Indication:  HF  Procedures performed:  1) Right heart catheterization  Description of procedure:   The risks and indication of the procedure were explained. Consent was signed and placed on the chart. An appropriate timeout was taken prior to the procedure. The right groin was prepped and draped in the routine sterile fashion and anesthetized with 1% local lidocaine.   A 7 FR venous sheath was placed in the right femoral vein using a modified Seldinger technique. A standard Swan-Ganz catheter was used for the procedure.   Complications: None apparent.  Findings:  With VAD 9200   RA = 4 RV = 33/2/6 PA = 33/14 (23) PCW = 8 Fick cardiac output/index = 6.5/3.7 PVR = 2.3 WU FA sat = 94% PA sat = 64%, 69%  Assessment: 1. Well compensated hemodynamics with VAD support  Plan/Discussion:  Proceed with transplant evaluation.   Glori Bickers, MD 9:58 AM

## 2014-02-12 NOTE — Progress Notes (Signed)
Patient d/c home, prior to leaving, PIs did drop in the 3.5-4.0 range (previous was in the 7s), patient given adequate PO hydration, Molly and Dr. Haroldine Laws made aware, will monitor. Patient ambulated 367feet, PIs were in the 7s range, Patient stated he felt good and denied SOB, dizziness, or discomfort.  Continued to discharge patient.   ALL discharge info was given to patient and wife, patient stated no questions

## 2014-02-12 NOTE — Discharge Summary (Signed)
Advanced Heart Failure Team  Discharge Summary   Patient ID: Johnny Oneill MRN: [id number], DOB/AGE: 1941-02-17 73 y.o. Admit date: 02/08/2014 D/C date:     02/12/2014   Primary Discharge Diagnoses:  1) Suspected hemothorax  2) Acute respiratory failure  - required intubation 6/11 through ed  3) Chronic systolic HF - RHC [id number] RA = 4 RV = 33/2/6 PA = 33/14 (23) PCW = 8 Fick cardiac output/index = 6.5/3.7  PVR = 2.3 WU FA sat = 94% PA sat = 64%, 69% - s/p LVAD  4) HTN- intolerant amlodipine and Ace-I . Tolerating hydralazine 25 mg every 6 hours.   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   He was admitted for [place] on June 11th as part of the transplant workup. He underwent attempted RHC through RIJ approach. Access attempted under ultrasound guidance but he developed severe hemoptysis and hypoxemia.  CXR showed RUL with infiltrate and questionable hemorrhage. Cardio/Thoracic Surgeon was urgently consulted for chest tube insertion however he had no pleural fluid. At that point critical care was consulted for urgent intubation and transferred to ICU.  Coumadin was stopped. He extubated the next day without difficulty. Coumadin was restarted as his condition improved without difficulty.  MAPs elevated but improved with the addition of hydralazine. He was discharged on hydralazine 25 mg every 6 hours. Prior to discharge he also had RHC as noted below with well compensated hemodynamics noted which supports the plan to proceed with transplant evaluation at [facility].   He will continue to be followed closely in the HF clinic with follow up June 22nd  at 10:00.   [place] 02/12/14 With VAD Support  RA = 4  RV = 33/2/6  PA = 33/14 (23)  PCW = 8  Fick cardiac output/index = 6.5/3.7  PVR = 2.3 WU  FA sat = 94%  PA sat = 64%, 69%    LVAD INTERROGATION:  HeartMate II  LVAD: Flow 4.2 liters/min, speed 9200, power 5.0 PI 7.8. Occasional PI events.    Discharge Weight Range: Discharge Weight 146 pounds Discharge Vitals: Blood pressure 112/84, pulse 83, temperature 97.1 F (36.2 C), temperature source Axillary, resp. rate 20, height 5\' 7"  (1.702 m), weight 146 lb 9.7 oz (66.5 kg), SpO2 96.00%.  Labs: Lab Results  Component Value Date   WBC 6.9 02/12/2014   HGB 9.9* 02/12/2014   HCT 32.0* 02/12/2014   MCV 88.6 02/12/2014   PLT 243 02/12/2014    Recent Labs Lab 02/09/14 0400  02/12/14 0400  NA 145  < > 137  K 3.9  < > 3.9  CL 108  < > 99  CO2 22  < > 25  BUN 16  < > 15  CREATININE 1.09  < > 1.03  CALCIUM 8.9  < > 9.0  PROT 6.5  --   --   BILITOT 1.0  --   --   ALKPHOS 108  --   --   ALT 24  --   --   AST 16  --   --   GLUCOSE 90  < > 95  < > = values in this interval not displayed. Lab Results  Component Value Date   CHOL 59 09/14/2013   HDL 24* 09/14/2013   LDLCALC 22 09/14/2013   TRIG 93 11/14/2013   BNP (last 3 results)  Recent Labs  01/29/14 1000 02/05/14 0832 02/10/14 0420  PROBNP 3360.0* 5665.0* 1528.0*    Diagnostic Studies/Procedures   No results found.  Discharge Medications     Medication List         aspirin EC 325 MG tablet  Take 325 mg by mouth daily.     citalopram 10 MG tablet  Commonly known as:  CELEXA  Take 1 tablet (10 mg total) by mouth daily.     CoQ10 200 MG Caps  Take 200 mg by mouth daily.     ezetimibe 10 MG tablet  Commonly known as:  ZETIA  Take 1 tablet (10 mg total) by mouth daily.     ferrous Q000111Q C-folic acid capsule  Commonly known as:  TRINSICON / FOLTRIN  Take 1 capsule by mouth daily.     furosemide 20 MG tablet  Commonly known as:  LASIX  Take 20 mg by mouth daily as needed (weight gain for 3 pounds in a day or 5 pounds in a week).     hydrALAZINE 25 MG tablet  Commonly known as:  APRESOLINE  Take 1 tablet (25 mg total) by mouth 4 (four) times daily.      levothyroxine 175 MCG tablet  Commonly known as:  SYNTHROID  Take 1 tablet (175 mcg total) by mouth daily before breakfast.     Omega-3 300 MG Caps  Take 300 mg by mouth at bedtime.     pantoprazole 40 MG tablet  Commonly known as:  PROTONIX  Take 1 tablet (40 mg total) by mouth daily.     rosuvastatin 20 MG tablet  Commonly known as:  CRESTOR  Take 1 tablet (20 mg total) by mouth daily.     temazepam 15 MG capsule  Commonly known as:  RESTORIL  Take 1 capsule (15 mg total) by mouth at bedtime.     warfarin 5 MG tablet  Commonly known as:  COUMADIN  Take 5 mg by mouth daily.        Disposition   The patient will be discharged in stable condition to home.     Discharge Instructions   Contraindication to ACEI at discharge    Complete by:  As directed      Diet - low sodium heart healthy    Complete by:  As directed      Heart Failure patients record your daily weight using the same scale at the same time of day    Complete by:  As directed      Increase activity slowly    Complete by:  As directed           Follow-up Information   Follow up with Glori Bickers, MD On 02/19/2014. (at 10:00 Garage Code 0400)    Specialty:  Cardiology   Contact information:   [place] [address] [phone]         Duration of Discharge Encounter: Greater than 35 minutes   Signed, CLEGG,AMY NP-C  02/12/2014, 5:13 PM  Patient seen and examined with Darrick Grinder, NP. We discussed all aspects of the encounter. I agree with the assessment and plan as stated above. I have edited with my changes.   Daniel Bensimhon,MD 8:16 PM

## 2014-02-12 NOTE — Interval H&P Note (Signed)
History and Physical Interval Note:  02/12/2014 9:57 AM  Johnny Oneill  has presented today for surgery, with the diagnosis of heart failure  The various methods of treatment have been discussed with the patient and family. After consideration of risks, benefits and other options for treatment, the patient has consented to  Procedure(s): RIGHT HEART CATH (N/A) as a surgical intervention .  The patient's history has been reviewed, patient examined, no change in status, stable for surgery.  I have reviewed the patient's chart and labs.  Questions were answered to the patient's satisfaction.     Porche Steinberger

## 2014-02-12 NOTE — H&P (View-Only) (Signed)
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   Admitted 3/4-3/25/15 with SOB and syncopal episode. Found to have SBO and NG placed and started on TPN. On 11/06/13 developed progressive SOB and hypotension and co-ox 51% and Hgb 6.7. Started on milrinone and transfused. ECHo showed nl RV function. Patient had PEA arrest and was intubated and started on pressors. CXR revelaed a R hemothorax and a R CT placed. He required VATs and evacuation of hemothorax and then tansferred to CIR.   Patient saw Dr. Rayann Heman 01/24/14 for device follow up. Per note: pt is stable and on an appropriate medical regimen. Normal ICD function. R waves have decreased since LVAD placement; CXR revealed stable lead position. He plans to follow clinically and try to avoid lead revision if possible.  Planned RHC 6/11 for transplant workup. Patient developed hemoptysis with attempted RIJ access. There was concern for hemothorax and Dr. Prescott Gum called to bedside to emergent attempt at CT, however no pleural fluid. He was intubated and transferred to ICU. Now extubated.  Sitting in chair. Feels great. Voice still hoarse. MAPs drop to 70s after hydralazine dose then creeps up to over 100 prior to next dose. No SOB/CP.    LVAD INTERROGATION:  HeartMate II LVAD:  Flow 4.2 liters/min, speed 9200, power 5.0  PI 7.8.  Occasional PI events.   Objective:    Vital Signs:   Temp:  [97.5 F (36.4 C)-98.7 F (37.1 C)] 97.5 F (36.4 C) (06/14 0729) Pulse Rate:  [71-87] 81 (06/14 1100) Resp:  [9-25] 18 (06/14 1100) SpO2:  [93 %-98 %] 98 % (06/14 1100) Arterial Line BP: (92-125)/(71-91) 119/88 mmHg (06/14 1100) Weight:  [67 kg (147 lb 11.3 oz)] 67 kg (147 lb 11.3 oz) (06/14 0600) Last BM Date: 02/08/14 Mean arterial Pressure 70-100s  Intake/Output:   Intake/Output Summary (Last  24 hours) at 02/11/14 1206 Last data filed at 02/11/14 1100  Gross per 24 hour  Intake   1055 ml  Output    895 ml  Net    160 ml     Physical Exam: General:  Well appearing, sitting up in chair HEENT: normal Neck: supple. JVP 7 . Carotids 2+ bilat; no bruits. No lymphadenopathy or thryomegaly appreciated. Cor: Mechanical heart sounds with LVAD hum present. Lungs: clear,  Abdomen: soft, nontender, nondistended. No hepatosplenomegaly. No bruits or masses. Good bowel sounds. Driveline: C/D/I; securement device intact and driveline incorporated Extremities: no cyanosis, clubbing, rash, edema Neuro: alert & orientedx3, cranial nerves grossly intact. moves all 4 extremities w/o difficulty. Affect pleasant  Telemetry: SR 80s  Labs: Basic Metabolic Panel:  Recent Labs Lab 02/05/14 0832 02/08/14 1045 02/09/14 0400 02/10/14 0420 02/11/14 0345  NA 143 141 145 141 142  K 3.8 2.8* 3.9 3.6* 3.6*  CL 102 101 108 104 106  CO2 29 22 22 24 26   GLUCOSE 94 137* 90 98 101*  BUN 13 19 16 16 17   CREATININE 1.38* 1.28 1.09 1.00 1.02  CALCIUM 9.4 8.5 8.9 8.6 8.6    Liver Function Tests:  Recent Labs Lab 02/05/14 0832 02/09/14 0400  AST 43* 16  ALT 75* 24  ALKPHOS 171* 108  BILITOT 1.2 1.0  PROT 7.9 6.5  ALBUMIN 3.7 3.1*   No results found for this basename: LIPASE, AMYLASE,  in the last 168 hours No results  found for this basename: AMMONIA,  in the last 168 hours  CBC:  Recent Labs Lab 02/08/14 1045 02/08/14 1635 02/09/14 0400 02/10/14 0420 02/11/14 0345  WBC 7.7 5.7 6.2 6.9 6.4  NEUTROABS 6.7  --  5.4 5.3 4.8  HGB 9.7* 8.7* 9.1* 9.3* 9.2*  HCT 30.7* 27.9* 29.3* 30.2* 30.7*  MCV 88.7 87.7 88.0 90.1 89.5  PLT 172 167 179 177 194    INR:  Recent Labs Lab 02/05/14 0832 02/08/14 0647 02/09/14 0400 02/10/14 0420 02/11/14 0345  INR 3.43* 2.85* 2.23* 2.05* 1.91*   Imaging: Dg Chest Port 1 View  02/10/2014   CLINICAL DATA:  Followup right effusion  EXAM:  PORTABLE CHEST - 1 VIEW  COMPARISON:  02/09/2014  FINDINGS: There is a left chest wall pacer device with lead in the right atrial appendage and right ventricle. The patient is status post median sternotomy and CABG procedure. Implantable left ventricular assist device is identified. Small partially loculated right pleural effusion is unchanged from previous exam.  IMPRESSION: 1. Cardiac enlargement. 2. Stable small partially loculated right pleural effusion.   Electronically Signed   By: Kerby Moors M.D.   On: 02/10/2014 08:04     Medications:     Scheduled Medications: . antiseptic oral rinse  15 mL Mouth Rinse QID  . aspirin EC  325 mg Oral Daily  . chlorhexidine  15 mL Mouth Rinse BID  . citalopram  10 mg Oral Daily  . ezetimibe  10 mg Oral Daily  . ferrous Q000111Q C-folic acid  1 capsule Oral Daily  . furosemide  20 mg Oral Daily  . hydrALAZINE  37.5 mg Oral 3 times per day  . levothyroxine  175 mcg Oral QAC breakfast  . pantoprazole  40 mg Oral Daily  . rosuvastatin  20 mg Oral q1800  . sodium chloride  3 mL Intravenous Q12H  . temazepam  15 mg Oral QHS  . warfarin  5 mg Oral q1800  . Warfarin - Pharmacist Dosing Inpatient   Does not apply q1800    Infusions: . dextrose 5 % and 0.9% NaCl Stopped (02/09/14 1900)    PRN Medications: sodium chloride, albuterol, fentaNYL, hydrALAZINE, menthol-cetylpyridinium, sodium chloride   Assessment:   1) Suspected hemothorax 2) Acute respiratory failure - intubated 3) Chronic systolic HF - s/p LVAD  4) HTN  Plan/Discussion:    Doing well. Will spread out hydralazine to 25 q6 to see if we can keep MAPs more stable. Continue to ambulate.  Plan RHC through groin in am.   Ambulate in the halls. Miralax for constipation.   I reviewed the LVAD parameters from today, and compared the results to the patient's prior recorded data.  No programming changes were made.  The LVAD is functioning within specified parameters.   The patient performs LVAD self-test daily.  LVAD interrogation was negative for any significant power changes, alarms or PI events/speed drops.  LVAD equipment check completed and is in good working order.  Back-up equipment present.   LVAD education done on emergency procedures and precautions and reviewed exit site care.  Length of Stay: 3  Glori Bickers MD  02/11/2014, 12:06 PM  VAD Team --- VAD ISSUES ONLY--- Pager [phone] (7am - 7am)  Advanced Heart Failure Team  Pager [phone] (M-F; 7a - 4p)  Please contact [facility] for night-coverage after hours (4p -7a ) and weekends on [email or web address]

## 2014-02-12 NOTE — Progress Notes (Signed)
[place] for Coumadin Indication: LVAD  Allergies  Allergen Reactions  . Ace Inhibitors     Hypotension and elevated creatinine  . Diovan [Valsartan] Other (See Comments)    Hypotension at low dose, hyperkalemia  . Lipitor [Atorvastatin Calcium] Other (See Comments)    Muscle pain  . Norvasc [Amlodipine] Other (See Comments)    Put patient to sleep    Patient Measurements: Height: 5\' 7"  (170.2 cm) Weight: 146 lb 9.7 oz (66.5 kg) IBW/kg (Calculated) : 66.1   Vital Signs: Temp: 97.1 F (36.2 C) (06/15 1144) Temp src: Axillary (06/15 1144) Pulse Rate: 90 (06/15 0941)  Labs:  Recent Labs  02/10/14 0420 02/11/14 0345 02/12/14 0400  HGB 9.3* 9.2* 9.9*  HCT 30.2* 30.7* 32.0*  PLT 177 194 243  LABPROT 22.5* 21.3* 21.2*  INR 2.05* 1.91* 1.90*  CREATININE 1.00 1.02 1.03    Estimated Creatinine Clearance: 62.4 ml/min (by C-G formula based on Cr of 1.03).  Assessment: 72yom with HF s/p LVAD, admitted after attempted RHC. On chronic anticoagulation for LVAD on warfarin 5mg  daily PTA.  INR 2.8 on admission but held and slightly reversed with FFP after attempted RHC.  Coumadin restarted 6/13; INR 1.9.  Slightly less than goal 2 -2.5 s/p RHC today,Monday. CBC stable, no bleeding noted, no complications during RHC.    Will give slightly higher dose of warfarin tonight.  Goal of Therapy:  INR 2-3 Monitor platelets by anticoagulation protocol: Yes   Plan:  Coumadin 7.5mg  tonight Daily INR, CBC  Erin Hearing PharmD., BCPS Clinical Pharmacist Pager [phone] 02/12/2014 12:24 PM

## 2014-02-12 NOTE — Progress Notes (Signed)
Upon returning from Cardiac Cath, Groin site assessed every 15 minutes, good pulse in RLE, good movement from patient, patient denies discomfort, will monitor

## 2014-02-12 NOTE — Discharge Instructions (Signed)
Heart Failure Heart failure means your heart has trouble pumping blood. This makes it hard for your body to work well. Heart failure is usually a long-term (chronic) condition. You must take good care of yourself and follow your doctor's treatment plan. HOME CARE  Take your heart medicine as told by your doctor.  Do not stop taking medicine unless your doctor tells you to.  Do not skip any dose of medicine.  Refill your medicines before they run out.  Take other medicines only as told by your doctor or pharmacist.  Stay active if told by your doctor. The elderly and people with severe heart failure should talk with a doctor about physical activity.  Eat heart healthy foods. Choose foods that are without trans fat and are low in saturated fat, cholesterol, and salt (sodium). This includes fresh or frozen fruits and vegetables, fish, lean meats, fat-free or low-fat dairy foods, whole grains, and high-fiber foods. Lentils and dried peas and beans (legumes) are also good choices.  Limit salt if told by your doctor.  Cook in a healthy way. Roast, grill, broil, bake, poach, steam, or stir-fry foods.  Limit fluids as told by your doctor.  Weigh yourself every morning. Do this after you pee (urinate) and before you eat breakfast. Write down your weight to give to your doctor.  Take your blood pressure and write it down if your doctor tell you to.  Ask your doctor how to check your pulse. Check your pulse as told.  Lose weight if told by your doctor.  Stop smoking or chewing tobacco. Do not use gum or patches that help you quit without your doctor's approval.  Schedule and go to doctor visits as told.  Nonpregnant women should have no more than 1 drink a day. Men should have no more than 2 drinks a day. Talk to your doctor about drinking alcohol.  Stop illegal drug use.  Stay current with shots (immunizations).  Manage your health conditions as told by your doctor.  Learn to manage  your stress.  Rest when you are tired.  If it is really hot outside:  Avoid intense activities.  Use air conditioning or fans, or get in a cooler place.  Avoid caffeine and alcohol.  Wear loose-fitting, lightweight, and light-colored clothing.  If it is really cold outside:  Avoid intense activities.  Layer your clothing.  Wear mittens or gloves, a hat, and a scarf when going outside.  Avoid alcohol.  Learn about heart failure and get support as needed.  Get help to maintain or improve your quality of life and your ability to care for yourself as needed. GET HELP IF:   You gain 03 lb/1.4 kg or more in 1 day or 05 lb/2.3 kg in a week.  You are more short of breath than usual.  You cannot do your normal activities.  You tire easily.  You cough more than normal, especially with activity.  You have any or more puffiness (swelling) in areas such as your hands, feet, ankles, or belly (abdomen).  You cannot sleep because it is hard to breathe.  You feel like your heart is beating fast (palpitations).  You get dizzy or lightheaded when you stand up. GET HELP RIGHT AWAY IF:   You have trouble breathing.  There is a change in mental status, such as becoming less alert or not being able to focus.  You have chest pain or discomfort.  You faint. MAKE SURE YOU:   Understand these   instructions.  Will watch your condition.  Will get help right away if you are not doing well or get worse. Document Released: 05/26/2008 Document Revised: 12/12/2012 Document Reviewed: 03/17/2012 [employer] Patient Information 2014 [employer].  

## 2014-02-12 NOTE — Progress Notes (Signed)
3 hours of bedrest completed per MD, site is intact, no bruising or hematoma.

## 2014-02-13 ENCOUNTER — Other Ambulatory Visit: Payer: Medicare Other

## 2014-02-14 ENCOUNTER — Telehealth (HOSPITAL_COMMUNITY): Payer: Self-pay | Admitting: *Deleted

## 2014-02-16 ENCOUNTER — Telehealth: Payer: Self-pay | Admitting: Endocrinology

## 2014-02-16 ENCOUNTER — Other Ambulatory Visit: Payer: Self-pay | Admitting: *Deleted

## 2014-02-16 ENCOUNTER — Telehealth (HOSPITAL_COMMUNITY): Payer: Self-pay | Admitting: *Deleted

## 2014-02-16 MED ORDER — LEVOTHYROXINE SODIUM 175 MCG PO TABS: 175.0000 ug | ORAL_TABLET | Freq: Every day | ORAL | Status: DC

## 2014-02-16 NOTE — Telephone Encounter (Signed)
Patient would like a refill of his Levothyroxine 3 month supply  Send to [facility] Patients wife states he only has 3 pills left please send refill to [facility]  Thank you :)

## 2014-02-16 NOTE — Telephone Encounter (Signed)
rx sent

## 2014-02-16 NOTE — Telephone Encounter (Signed)
Opened in error

## 2014-02-16 NOTE — Telephone Encounter (Signed)
Wife called to report patient experiencing dizzy spells along with drop in PI down to 5.0 from 7.5; he is drinking and weight is stable at 148 lbs. Denies any other VAD parameter changes. Dr. Haroldine Laws updated - instructed wife to decrease Hydralazine to three times daily and call if symptoms continue or worsen. Wife verbalized understanding of same.

## 2014-02-19 ENCOUNTER — Other Ambulatory Visit (HOSPITAL_COMMUNITY): Payer: Self-pay | Admitting: *Deleted

## 2014-02-19 ENCOUNTER — Ambulatory Visit (HOSPITAL_COMMUNITY)
Admit: 2014-02-19 | Discharge: 2014-02-19 | Disposition: A | Discharge status: Home / Self Care | Payer: Medicare Other | Source: Ambulatory Visit | Attending: Internal Medicine | Admitting: Internal Medicine

## 2014-02-19 ENCOUNTER — Ambulatory Visit (HOSPITAL_COMMUNITY): Payer: Self-pay | Admitting: *Deleted

## 2014-02-19 VITALS — BP 110/0 | HR 81 | Ht 69.0 in | Wt 152.8 lb

## 2014-02-19 DIAGNOSIS — Z95811 Presence of heart assist device: Secondary | ICD-10-CM

## 2014-02-19 DIAGNOSIS — Z7901 Long term (current) use of anticoagulants: Secondary | ICD-10-CM

## 2014-02-19 DIAGNOSIS — I2589 Other forms of chronic ischemic heart disease: Secondary | ICD-10-CM

## 2014-02-19 LAB — BASIC METABOLIC PANEL
BUN: 14 mg/dL (ref 6–23)
CO2: 25 mEq/L (ref 19–32)
Calcium: 9.3 mg/dL (ref 8.4–10.5)
Chloride: 100 mEq/L (ref 96–112)
Creatinine, Ser: 1.18 mg/dL (ref 0.50–1.35)
GFR calc Af Amer: 70 mL/min — ABNORMAL LOW (ref >90)
GFR calc non Af Amer: 61 mL/min — ABNORMAL LOW (ref >90)
Glucose, Bld: 73 mg/dL (ref 70–99)
Potassium: 3.9 mEq/L (ref 3.7–5.3)
Sodium: 140 mEq/L (ref 137–147)

## 2014-02-19 LAB — PROTIME-INR
INR: 1.63 — ABNORMAL HIGH (ref 0.00–1.49)
Prothrombin Time: 18.9 seconds — ABNORMAL HIGH (ref 11.6–15.2)

## 2014-02-19 LAB — CBC
HCT: 33.5 % — ABNORMAL LOW (ref 39.0–52.0)
Hemoglobin: 10.4 g/dL — ABNORMAL LOW (ref 13.0–17.0)
MCH: 27.8 pg (ref 26.0–34.0)
MCHC: 31 g/dL (ref 30.0–36.0)
MCV: 89.6 fL (ref 78.0–100.0)
Platelets: 259 10*3/uL (ref 150–400)
RBC: 3.74 MIL/uL — ABNORMAL LOW (ref 4.22–5.81)
RDW: 18.7 % — ABNORMAL HIGH (ref 11.5–15.5)
WBC: 6.6 10*3/uL (ref 4.0–10.5)

## 2014-02-19 LAB — PRO B NATRIURETIC PEPTIDE: Pro B Natriuretic peptide (BNP): 2350 pg/mL — ABNORMAL HIGH (ref 0–125)

## 2014-02-19 LAB — LACTATE DEHYDROGENASE: LDH: 301 U/L — ABNORMAL HIGH (ref 94–250)

## 2014-02-19 NOTE — Progress Notes (Signed)
Patient ID: RAUN KEIL, male   DOB: May 25, 1941, 73 y.o.   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   Admitted 3/4-3/25/15 with SOB and syncopal episode. Found to have SBO and NG placed and started on TPN. On 11/06/13 developed progressive SOB and hypotension and co-ox 51% and Hgb 6.7. Started on milrinone and transfused. Echo showed nl RV function. Patient had PEA arrest and was intubated and started on pressors. CXR revelaed a R hemothorax and a R CT placed. He required VATs  and evacuation of hemothorax and then tansferred to CIR.   Patient saw Dr. Rayann Heman 01/24/14 for device follow up. Per note: pt is stable and on an appropriate medical regimen. Normal ICD function. R waves have decreased since LVAD placement; CXR revealed stable lead position. He plans to follow clinically and try to avoid lead revision if possible.  He returns for an acute work in due to fatigue. Last week he started norvasc 2.5 mg but had nausea and later stopped 2 days later.On 6/6 he had 4 low flow alarms.  Yesterday he took 40 mg of lasix for increased weight and dyspnea.  He  diuresed 4 pounds back to 148 pounds. Denies orthopnea/PND. He has been intolerant of ACE inhibitors and calcium channel blockers.   Denies LVAD alarms.  Denies driveline trauma, erythema or drainage.  Denies ICD shocks.    Reports taking Coumadin as prescribed and adherence to anticoagulation based dietary restrictions.  Denies bright red blood per rectum or melena, no dark urine or hematuria.    Patient had [place] in [id number] complicated by suspected right hemothorax (used RIJ access initially, RHC completed via right femoral - see numbers below).  Small pleural effusion only, unable to drain via chest tube.  Currently feeling good.  Dyspnea only with climbing a flight of steps.  Has not needed Lasix.  On  hydralazine 25 mg tid now, MAP at home in 90s (always higher here).  Weight stable.   [place] 6/15 With VAD 9200  RA = 4  RV = 33/2/6  PA = 33/14 (23)  PCW = 8  Fick cardiac output/index = 6.5/3.7  PVR = 2.3 WU  FA sat = 94%  PA sat = 64%, 69%   Past Medical History  Diagnosis Date  . Ischemic cardiomyopathy     a. 10/09 Echo: Sev LV Dysfxn, inf/lat AK, Mod MR.  [name];  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 Hypertension   . PVC (premature ventricular contraction)   . Pacemaker   . CHF (congestive heart failure)   . Shortness of breath   . Automatic implantable cardioverter-defibrillator in situ     Current Outpatient Prescriptions  Medication Sig Dispense Refill  . aspirin EC 325 MG tablet Take 325 mg by mouth daily.      . citalopram (CELEXA) 10 MG tablet Take 1 tablet (10  mg total) by mouth daily.  30 tablet  6  . Coenzyme Q10 (COQ10) 200 MG CAPS Take 200 mg by mouth daily.      [name] ezetimibe (ZETIA) 10 MG tablet Take 1 tablet (10 mg total) by mouth daily.  90 tablet  3  . ferrous [id number] C-folic acid (TRINSICON / FOLTRIN) capsule Take 1 capsule by mouth daily.       . furosemide (LASIX) 20 MG tablet Take 20 mg by mouth daily as needed (weight gain for 3 pounds in a day or 5 pounds in a week).       . hydrALAZINE (APRESOLINE) 25 MG tablet Take 25 mg by mouth 3 (three) times daily.      [name] levothyroxine (SYNTHROID) 175 MCG tablet Take 1 tablet (175 mcg total) by mouth daily before breakfast.  90 tablet  1  . Omega-3 300 MG CAPS Take 300 mg by mouth at bedtime.       . pantoprazole (PROTONIX) 40 MG tablet Take 1 tablet (40 mg  total) by mouth daily.  30 tablet  1  . rosuvastatin (CRESTOR) 20 MG tablet Take 1 tablet (20 mg total) by mouth daily.  90 tablet  3  . temazepam (RESTORIL) 15 MG capsule Take 1 capsule (15 mg total) by mouth at bedtime.  30 capsule  3  . warfarin (COUMADIN) 5 MG tablet Take 5 mg by mouth daily.        No current facility-administered medications for this encounter.   Facility-Administered Medications Ordered in Other Encounters  Medication Dose Route Frequency Provider Last Rate Last Dose  . etomidate (AMIDATE) injection    Anesthesia Intra-op Sammuel Cooper Mumm, CRNA   20 mg at 02/08/14 0915  . rocuronium [facility]) injection    Anesthesia Intra-op Sammuel Cooper Mumm, CRNA   50 mg at 02/08/14 0932  . succinylcholine (ANECTINE) injection    Anesthesia Intra-op Sammuel Cooper Mumm, CRNA   100 mg at 02/08/14 0915    Ace inhibitors; Diovan; Lipitor; and Norvasc  REVIEW OF SYSTEMS: All systems negative except as listed in HPI, PMH and Problem list.  Filed Vitals:   02/19/14 1026  BP: 110/0  Pulse: 81  Height: 5\' 9"  (1.753 m)  Weight: 152 lb 12.8 oz (69.31 kg)  SpO2: 98%   LVAD interrogation reveals:  Speed:  9200 Flow:  4.0 Power: 5.0   PI: 7.6 Alarms:  none Events:  none Fixed speed:  9200  RPM Low speed limit:  8600 RPM  I reviewed the LVAD parameters from today, and compared the results to the patient's prior recorded data.  No programming changes were made.  The LVAD is functioning within specified parameters.  The patient performs LVAD self-test daily.  LVAD interrogation was negative for any significant power changes, alarms or PI events/speed drops.  LVAD equipment check completed and is in good working order.  Back-up equipment present.   LVAD education done on emergency procedures and precautions and reviewed exit site care.    Filed Vitals:   02/19/14 1026  BP: 110/0  Pulse: 81  Height: 5\' 9"  (1.753 m)  Weight: 152 lb 12.8 oz (69.31 kg)  SpO2: 98%    MAP 110  Physical  Exam: GENERAL: Well appearing, male who presents to clinic today in no acute distress; Wife present HEENT: normal  NECK: Supple, JVP not elevated, no bruits.  No lymphadenopathy or thyromegaly appreciated.    CARDIAC:  Mechanical heart sounds with LVAD hum present.  LUNGS:  Clear to auscultation bilaterally.  ABDOMEN:  Soft, round, nontender, positive bowel sounds x4.     LVAD exit site: well-healed and semi incorporated.  Dressing dry and intact.  No erythema or drainage.  Stabilization device present and accurately applied.  Driveline dressing is being changed weekly per sterile technique. EXTREMITIES:  Warm and dry, no cyanosis, clubbing, rash.  No lower extremity edema  NEUROLOGIC:  Alert and oriented x 4.  Gait steady.  No aphasia.  No dysarthria.  Affect pleasant.     ASSESSMENT AND PLAN:   1) Chronic systolic HF: ICM, EF 0000000 s/p LVAD HM II implant 08/2013.   RHC looked good earlier this month.  - NYHA II symptoms and volume status stable. He is on PRN Lasix 20 mg (has not needed recently).   - Not on BB with history of RV failure - MAP better (though still 110 here, has been in 90s at home), continue current hydralazine.  - Has appt in transplant clinic at [facility] in early July.  - Reinforced the need and importance of daily weights, a low sodium diet, and fluid restriction (less than 2 L a day). Instructed to call the HF clinic if weight increases more than 3 lbs overnight or 5 lbs in a week.  2) LVAD: Doing well. All LVAD parameters nl. Will check LDH, CBD, INR and BMET today.  - Currently status 7 on tx list at [facility]. Has appt in early July.  3) Chronic anticoagulation: INR goal 2.0-3.0. No bleeding issues. Adjust coumadin accordingly. Continue ASA 325 mg for LVAD 4) HTN: MAP better on higher hydralazine, now in 90s at home (he has his own doppler).  Will continue current meds for now.  5) Hypothyroidism: Per Dr Dwyane Dee.   Semiah Konczal [place] 02/19/2014

## 2014-02-19 NOTE — Patient Instructions (Addendum)
1.  Take coumadin 7.5 mg today then change weekly dose to 5 mg daily except 7.5 mg on Monday.  Re-check INR Monday 02/26/14 2    Return to [place] clinic one month.

## 2014-02-19 NOTE — Progress Notes (Signed)
Symptom  Yes  No  Details   Angina        x Ativity:   Claudication        x How far:   Syncope              x When:  Orthostatic dizziness with Hydralazine 25 mg QID; decreased to TID with sx relief  Stroke        x   Orthopnea        x How many pillows:   one  PND        x How often:    CPAP    N/A  How many hrs:   Pedal edema        x         Twice last week; cleared overnight   Abd fullness              x   N&V        x good appetite; eating full meals  Diaphoresis        x When:  Bleeding           x     Urine color        light yellow  SOB            x Activity:   Walking up incline  Palpitations        x When:   ICD shock        x   Hospitlizaitons        x       When/where/why: 6/11 - 6/15 RHC with suspected hemathorax  ED visit        x When/where/why:  Other MD        x When/who/why:  Activity    Chores; working at [employer]  Fluid    No limitations; drinking 1 liter day  Diet     Low salt   Vital signs: HR: 81 MAP BP:  110 O2 Sat:  98 Wt:   152.8 lbs Last weight: hospital d/c wt 146 lbs Ht: 5'9"  LVAD interrogation reveals:  Speed:  9200 Flow:  4.0 Power: 5.0   PI: 7.6 Alarms:  none Events:  none Fixed speed:  9200  RPM Low speed limit:  8600 RPM  LVAD exit site:   Driveline completely healed and incorporated. The velour is fully implanted at exit site. Sorbaview dressing with biopatch intact. Dressing change using sterile technique with no redness, drainage, tenderness, or foul odor noted.  Stabilization device present and accurately applied. Driveline dressing is being changed weekly per sterile technique using sorbaview dressing. Pt denies fever or chills. Shower not available at home; has not started showers.  Right side chest tube sites x 2 healed; sutures removed per Dr. Thayer Ohm orders.   I reviewed the LVAD parameters from today, and compared the results to the patient's prior recorded data. The LVAD is functioning within specified parameters. LVAD  interrogation was negative for any significant power changes or alarms.     Pt/caregiver deny any alarms or VAD equipment issues. VAD coordinator reviewed daily log from home for daily temperature, weight, and VAD parameters. Pt is performing daily controller and system monitor self tests along with completing weekly and monthly maintenance for LVAD equipment.   LVAD equipment check completed and is in good working order. Back-up equipment present. LVAD education done on emergency procedures and precautions and reviewed exit site care.

## 2014-02-20 ENCOUNTER — Encounter (HOSPITAL_COMMUNITY): Payer: Medicare Other

## 2014-02-20 ENCOUNTER — Other Ambulatory Visit: Payer: Medicare Other

## 2014-02-26 ENCOUNTER — Ambulatory Visit (HOSPITAL_COMMUNITY): Payer: Self-pay | Admitting: *Deleted

## 2014-02-26 ENCOUNTER — Ambulatory Visit (INDEPENDENT_AMBULATORY_CARE_PROVIDER_SITE_OTHER): Payer: Medicare Other | Admitting: *Deleted

## 2014-02-26 DIAGNOSIS — Z95811 Presence of heart assist device: Secondary | ICD-10-CM

## 2014-02-26 DIAGNOSIS — Z7901 Long term (current) use of anticoagulants: Secondary | ICD-10-CM

## 2014-02-26 LAB — PROTIME-INR
INR: 2.4 ratio — ABNORMAL HIGH (ref 0.8–1.0)
Prothrombin Time: 26.1 s — ABNORMAL HIGH (ref 9.6–13.1)

## 2014-02-27 ENCOUNTER — Other Ambulatory Visit (HOSPITAL_COMMUNITY): Payer: Self-pay | Admitting: *Deleted

## 2014-02-27 DIAGNOSIS — Z7901 Long term (current) use of anticoagulants: Secondary | ICD-10-CM

## 2014-02-27 DIAGNOSIS — Z95811 Presence of heart assist device: Secondary | ICD-10-CM

## 2014-03-07 ENCOUNTER — Ambulatory Visit (INDEPENDENT_AMBULATORY_CARE_PROVIDER_SITE_OTHER): Payer: Medicare Other | Admitting: *Deleted

## 2014-03-07 ENCOUNTER — Ambulatory Visit (HOSPITAL_COMMUNITY): Payer: Self-pay | Admitting: *Deleted

## 2014-03-07 DIAGNOSIS — Z95811 Presence of heart assist device: Secondary | ICD-10-CM

## 2014-03-07 DIAGNOSIS — Z7901 Long term (current) use of anticoagulants: Secondary | ICD-10-CM

## 2014-03-07 LAB — PROTIME-INR
INR: 3.8 ratio — ABNORMAL HIGH (ref 0.8–1.0)
Prothrombin Time: 40.8 s — ABNORMAL HIGH (ref 9.6–13.1)

## 2014-03-14 ENCOUNTER — Other Ambulatory Visit (INDEPENDENT_AMBULATORY_CARE_PROVIDER_SITE_OTHER): Payer: Medicare Other

## 2014-03-14 ENCOUNTER — Ambulatory Visit (HOSPITAL_COMMUNITY): Payer: Self-pay | Admitting: *Deleted

## 2014-03-14 DIAGNOSIS — Z95811 Presence of heart assist device: Secondary | ICD-10-CM

## 2014-03-14 DIAGNOSIS — Z7901 Long term (current) use of anticoagulants: Secondary | ICD-10-CM

## 2014-03-14 LAB — PROTIME-INR
INR: 2.7 ratio — ABNORMAL HIGH (ref 0.8–1.0)
Prothrombin Time: 29 s — ABNORMAL HIGH (ref 9.6–13.1)

## 2014-03-16 ENCOUNTER — Encounter (HOSPITAL_COMMUNITY): Payer: Self-pay

## 2014-03-21 ENCOUNTER — Encounter (HOSPITAL_COMMUNITY): Payer: Medicare Other

## 2014-03-28 ENCOUNTER — Encounter (HOSPITAL_COMMUNITY): Payer: Self-pay

## 2014-03-28 ENCOUNTER — Ambulatory Visit (HOSPITAL_COMMUNITY)
Admission: RE | Admit: 2014-03-28 | Discharge: 2014-03-28 | Disposition: A | Discharge status: Home / Self Care | Payer: Medicare Other | Source: Ambulatory Visit | Attending: Cardiology | Admitting: Cardiology

## 2014-03-28 ENCOUNTER — Other Ambulatory Visit: Payer: Self-pay | Admitting: *Deleted

## 2014-03-28 ENCOUNTER — Ambulatory Visit (HOSPITAL_COMMUNITY): Payer: Self-pay | Admitting: *Deleted

## 2014-03-28 VITALS — BP 112/0 | HR 83 | Ht 69.0 in | Wt 155.6 lb

## 2014-03-28 DIAGNOSIS — I10 Essential (primary) hypertension: Secondary | ICD-10-CM | POA: Diagnosis not present

## 2014-03-28 DIAGNOSIS — I252 Old myocardial infarction: Secondary | ICD-10-CM | POA: Insufficient documentation

## 2014-03-28 DIAGNOSIS — Z7901 Long term (current) use of anticoagulants: Secondary | ICD-10-CM

## 2014-03-28 DIAGNOSIS — Z95811 Presence of heart assist device: Secondary | ICD-10-CM | POA: Insufficient documentation

## 2014-03-28 DIAGNOSIS — I2589 Other forms of chronic ischemic heart disease: Secondary | ICD-10-CM | POA: Insufficient documentation

## 2014-03-28 DIAGNOSIS — I509 Heart failure, unspecified: Secondary | ICD-10-CM | POA: Insufficient documentation

## 2014-03-28 DIAGNOSIS — E7800 Pure hypercholesterolemia, unspecified: Secondary | ICD-10-CM | POA: Diagnosis not present

## 2014-03-28 DIAGNOSIS — I5022 Chronic systolic (congestive) heart failure: Secondary | ICD-10-CM | POA: Diagnosis present

## 2014-03-28 DIAGNOSIS — I4949 Other premature depolarization: Secondary | ICD-10-CM | POA: Insufficient documentation

## 2014-03-28 DIAGNOSIS — F3289 Other specified depressive episodes: Secondary | ICD-10-CM

## 2014-03-28 DIAGNOSIS — F411 Generalized anxiety disorder: Secondary | ICD-10-CM | POA: Insufficient documentation

## 2014-03-28 DIAGNOSIS — Z7982 Long term (current) use of aspirin: Secondary | ICD-10-CM | POA: Diagnosis not present

## 2014-03-28 DIAGNOSIS — F32A Depression, unspecified: Secondary | ICD-10-CM

## 2014-03-28 DIAGNOSIS — F329 Major depressive disorder, single episode, unspecified: Secondary | ICD-10-CM

## 2014-03-28 DIAGNOSIS — E039 Hypothyroidism, unspecified: Secondary | ICD-10-CM | POA: Diagnosis not present

## 2014-03-28 DIAGNOSIS — I428 Other cardiomyopathies: Secondary | ICD-10-CM | POA: Diagnosis not present

## 2014-03-28 DIAGNOSIS — I255 Ischemic cardiomyopathy: Secondary | ICD-10-CM

## 2014-03-28 LAB — BASIC METABOLIC PANEL
Anion gap: 18 — ABNORMAL HIGH (ref 5–15)
BUN: 21 mg/dL (ref 6–23)
CO2: 19 mEq/L (ref 19–32)
Calcium: 9.1 mg/dL (ref 8.4–10.5)
Chloride: 102 mEq/L (ref 96–112)
Creatinine, Ser: 1.31 mg/dL (ref 0.50–1.35)
GFR calc Af Amer: 62 mL/min — ABNORMAL LOW (ref >90)
GFR calc non Af Amer: 54 mL/min — ABNORMAL LOW (ref >90)
Glucose, Bld: 95 mg/dL (ref 70–99)
Potassium: 4.4 mEq/L (ref 3.7–5.3)
Sodium: 139 mEq/L (ref 137–147)

## 2014-03-28 LAB — COMPREHENSIVE METABOLIC PANEL
ALT: 17 U/L (ref 0–53)
AST: 27 U/L (ref 0–37)
Albumin: 3.9 g/dL (ref 3.5–5.2)
Alkaline Phosphatase: 128 U/L — ABNORMAL HIGH (ref 39–117)
Anion gap: 20 — ABNORMAL HIGH (ref 5–15)
BUN: 21 mg/dL (ref 6–23)
CO2: 17 mEq/L — ABNORMAL LOW (ref 19–32)
Calcium: 9.2 mg/dL (ref 8.4–10.5)
Chloride: 102 mEq/L (ref 96–112)
Creatinine, Ser: 1.42 mg/dL — ABNORMAL HIGH (ref 0.50–1.35)
GFR calc Af Amer: 56 mL/min — ABNORMAL LOW (ref >90)
GFR calc non Af Amer: 49 mL/min — ABNORMAL LOW (ref >90)
Glucose, Bld: 86 mg/dL (ref 70–99)
Potassium: 5.4 mEq/L — ABNORMAL HIGH (ref 3.7–5.3)
Sodium: 139 mEq/L (ref 137–147)
Total Bilirubin: 0.6 mg/dL (ref 0.3–1.2)
Total Protein: 7.7 g/dL (ref 6.0–8.3)

## 2014-03-28 LAB — LACTATE DEHYDROGENASE: LDH: 325 U/L — ABNORMAL HIGH (ref 94–250)

## 2014-03-28 LAB — PRO B NATRIURETIC PEPTIDE: Pro B Natriuretic peptide (BNP): 2663 pg/mL — ABNORMAL HIGH (ref 0–125)

## 2014-03-28 LAB — PROTIME-INR
INR: 2.84 — ABNORMAL HIGH (ref 0.00–1.49)
Prothrombin Time: 29.8 seconds — ABNORMAL HIGH (ref 11.6–15.2)

## 2014-03-28 LAB — CBC
HCT: 38.9 % — ABNORMAL LOW (ref 39.0–52.0)
Hemoglobin: 12 g/dL — ABNORMAL LOW (ref 13.0–17.0)
MCH: 28.2 pg (ref 26.0–34.0)
MCHC: 30.8 g/dL (ref 30.0–36.0)
MCV: 91.3 fL (ref 78.0–100.0)
Platelets: 181 10*3/uL (ref 150–400)
RBC: 4.26 MIL/uL (ref 4.22–5.81)
RDW: 18 % — ABNORMAL HIGH (ref 11.5–15.5)
WBC: 5.7 10*3/uL (ref 4.0–10.5)

## 2014-03-28 MED ORDER — CITALOPRAM HYDROBROMIDE 10 MG PO TABS: 10.0000 mg | ORAL_TABLET | Freq: Every day | ORAL | Status: DC

## 2014-03-28 NOTE — Progress Notes (Signed)
Patient ID: Johnny Oneill, male   DOB: 02-06-1941, 73 y.o.   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   Admitted 3/4-3/25/15 with SOB and syncopal episode. Found to have SBO and NG placed and started on TPN. On 11/06/13 developed progressive SOB and hypotension and co-ox 51% and Hgb 6.7. Started on milrinone and transfused. Echo showed nl RV function. Patient had PEA arrest and was intubated and started on pressors. CXR revelaed a R hemothorax and a R CT placed. He required VATs  and evacuation of hemothorax and then tansferred to CIR.   Patient saw Dr. Rayann Heman 01/24/14 for device follow up. Per note: pt is stable and on an appropriate medical regimen. Normal ICD function. R waves have decreased since LVAD placement; CXR revealed stable lead position. He plans to follow clinically and try to avoid lead revision if possible.  Follow up for Heart Failure: Doing well. No changes to medications. Weight stable 149-151 lbs. Taking lasix PRN and has only needed once. Denies SOB, orthopnea, PND or CP. Can walk about 1/2 mile before getting minimal SOB but does not have to stop. MAPs at home 104-118. Very active. Drinking less than 2L a day.    Denies LVAD alarms.  Denies driveline trauma, erythema or drainage.  Denies ICD shocks.    Reports taking Coumadin as prescribed and adherence to anticoagulation based dietary restrictions.  Denies bright red blood per rectum or melena, no dark urine or hematuria.    Patient had [place] in 5/63 complicated by suspected right hemothorax (used RIJ access initially, RHC completed via right femoral - see numbers below).  Small pleural effusion only, unable to drain via chest tube.  [place] 6/15 With VAD 9200  RA = 4  RV = 33/2/6  PA = 33/14 (23)  PCW = 8  Fick cardiac output/index = 6.5/3.7  PVR = 2.3 WU  FA sat = 94%  PA sat = 64%,  69%   Past Medical History  Diagnosis Date  . Ischemic cardiomyopathy     a. 10/09 Echo: Sev LV Dysfxn, inf/lat AK, Mod MR.  [name];  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 Hypertension   . PVC (premature ventricular contraction)   . Pacemaker   . CHF (congestive heart failure)   . Shortness of breath   . Automatic implantable cardioverter-defibrillator in situ     Current Outpatient Prescriptions  Medication Sig Dispense Refill  . aspirin EC 325 MG tablet Take 325 mg by mouth daily.      . citalopram (CELEXA) 10 MG tablet Take 1 tablet (10 mg total) by mouth daily.  30 tablet  6  . Coenzyme Q10 (COQ10) 200 MG CAPS Take 200 mg by mouth daily.      [name] ezetimibe (ZETIA) 10 MG tablet Take 1 tablet (10 mg total) by mouth daily.  90 tablet  3  .  ferrous [id number] C-folic acid (TRINSICON / FOLTRIN) capsule Take 1 capsule by mouth daily.       . hydrALAZINE (APRESOLINE) 25 MG tablet Take 25 mg by mouth 3 (three) times daily.      [name] levothyroxine (SYNTHROID) 175 MCG tablet Take 1 tablet (175 mcg total) by mouth daily before breakfast.  90 tablet  1  . Omega-3 300 MG CAPS Take 300 mg by mouth at bedtime.       . pantoprazole (PROTONIX) 40 MG tablet Take 1 tablet (40 mg total) by mouth daily.  30 tablet  1  . rosuvastatin (CRESTOR) 20 MG tablet Take 1 tablet (20 mg total) by mouth daily.  90 tablet  3  . temazepam (RESTORIL) 15 MG capsule Take 1 capsule (15 mg total) by mouth at bedtime.  30 capsule  3  . warfarin (COUMADIN) 5 MG tablet Take 5 mg by mouth daily.       . furosemide (LASIX) 20 MG tablet Take 20 mg by mouth daily as needed  (weight gain for 3 pounds in a day or 5 pounds in a week).        No current facility-administered medications for this encounter.   Facility-Administered Medications Ordered in Other Encounters  Medication Dose Route Frequency Provider Last Rate Last Dose  . etomidate (AMIDATE) injection    Anesthesia Intra-op Sammuel Cooper Mumm, CRNA   20 mg at 02/08/14 0915  . rocuronium [facility]) injection    Anesthesia Intra-op Sammuel Cooper Mumm, CRNA   50 mg at 02/08/14 0932  . succinylcholine (ANECTINE) injection    Anesthesia Intra-op Sammuel Cooper Mumm, CRNA   100 mg at 02/08/14 0915    Ace inhibitors; Diovan; Lipitor; and Norvasc  REVIEW OF SYSTEMS: All systems negative except as listed in HPI, PMH and Problem list.  LVAD interrogation reveals:  Speed:  9200 Flow:  3.6 Power: 4.8 PI: 6.8 Alarms:  none Events: occasional PI event Fixed speed:  9200  RPM Low speed limit:  8600 RPM  I reviewed the LVAD parameters from today, and compared the results to the patient's prior recorded data.  No programming changes were made.  The LVAD is functioning within specified parameters.  The patient performs LVAD self-test daily.  LVAD interrogation was negative for any significant power changes, alarms or PI events/speed drops.  LVAD equipment check completed and is in good working order.  Back-up equipment present.   LVAD education done on emergency procedures and precautions and reviewed exit site care.    Filed Vitals:   03/28/14 1441  BP: 112/0  Pulse: 83  Height: $Remove'5\' 9"'PFJxAuc$  (1.753 m)  Weight: 155 lb 9.6 oz (70.58 kg)  SpO2: 97%    Physical Exam: GENERAL: Well appearing, male who presents to clinic today in no acute distress; Wife present HEENT: normal  NECK: Supple, JVP not elevated, no bruits.  No lymphadenopathy or thyromegaly appreciated.    CARDIAC:  Mechanical heart sounds with LVAD hum present.  LUNGS:  Clear to auscultation bilaterally.  ABDOMEN:  Soft, round, nontender, positive bowel sounds x4.      LVAD exit site: well-healed and semi incorporated.  Dressing dry and intact.  No erythema or drainage.  Stabilization device present and accurately applied.  Driveline dressing is being changed weekly per sterile technique. EXTREMITIES:  Warm and dry, no cyanosis, clubbing, rash.  No lower extremity edema  NEUROLOGIC:  Alert and oriented x 4.  Gait steady.  No aphasia.  No dysarthria.  Affect pleasant.  ASSESSMENT AND PLAN:   1) Chronic systolic HF: ICM, EF 43-70% s/p LVAD HM II implant 08/2013.   RHC looked good earlier this month.  - NYHA II symptoms and volume status stable. Will continue lasix PRN for weight gain more than 3 lbs in a day or 5 lbs in a week.  - Not on BB with history of RV failure.  - MAP remains elevated. Wife purchased doppler and is running 104-118 at home. Patient is very sensitive to medication changes. Will try to increase am dose of hydralazine to 37.5 mg and continue 25 mg at lunch and dinner. Instructed to call if any dizziness or change in symptoms and will go back to 25 mg TID.   - He has not tolerated ACE-I, Spiro or norvasc in the past. - Met with tx team and we had long discussion about proceeding. Discussed that I think it would be a good idea to move forward with listing but he can always decide if call comes for a heart if he wants to move forward with it or keep LVAD if functioning well.  2) LVAD:  - Doing well. All LVAD parameters nl. Will check LDH, CBD, INR and CMET today.  - Currently status 7 on tx list at [facility] and they are discussing listing him as 1B. - Will complete 6 month Intermacs data today. 3) Chronic anticoagulation: INR goal 2.0-3.0. No bleeding issues. Adjust coumadin accordingly. Continue ASA 325 mg for LVAD 4) HTN: MAP better on higher hydralazine, but still a little elevated. As above increase am dose of hydralazine to 37.5 mg and continue 25 mg at lunch and dinner.  5) Hypothyroidism: Per Dr Dwyane Dee.    F/U 1 month Junie Bame  [facility] 03/28/2014

## 2014-03-28 NOTE — Patient Instructions (Signed)
1.  Increase Hydralazine to 37.5 mg am and 25 mg noon and 25 mg bedtime. 2.  Will call you with INR results and coumadin instructions. 3.  Return to [place] clinic one month.

## 2014-03-28 NOTE — Progress Notes (Signed)
Symptom  Yes  No  Details   Angina        x Ativity:   Claudication        x How far:   Syncope              x When:    Stroke        x   Orthopnea        x How many pillows:   one  PND        x How often:    CPAP    N/A  How many hrs:   Pedal edema              x    Abd fullness              x   N&V        x good appetite; eating full meals  Diaphoresis        x When:  Bleeding           x     Urine color        light yellow  SOB            x Activity:   Walking level ground 1/2 mile; incline  Palpitations        x When:   ICD shock        x   Hospitlizaitons              x    When/where/why:   ED visit        x When/where/why:  Other MD        x       When/who/why:  [facility] Dr. Sandi Mariscal, Dr. Sebastian Ache, [place] - updating transplant evaluation  Activity    Chores; working at church  Fluid    No limitations; drinking 1 liter day  Diet     Low salt   Vital signs: HR: 83 MAP BP:  112 O2 Sat:  97 Wt:   153.6  lbs Last weight:  155.6 lbs Ht: 5'9"  LVAD interrogation reveals:  Speed:  9200 Flow:  4.8 Power: 4.8 PI: 7.0 Alarms:  none Events:  none Fixed speed:  9200  RPM Low speed limit:  8600 RPM  LVAD exit site:   Driveline completely healed and incorporated. The velour is fully implanted at exit site. Sorbaview dressing with biopatch intact. Dressing change using sterile technique with no redness, drainage, tenderness, or foul odor noted.  Stabilization device present and accurately applied. Driveline dressing is being changed weekly per sterile technique using sorbaview dressing. Pt denies fever or chills. Shower not available at home; has not started showers.  I reviewed the LVAD parameters from today, and compared the results to the patient's prior recorded data. The LVAD is functioning within specified parameters. LVAD interrogation was negative for any significant power changes or alarms.     Pt/caregiver deny any alarms or VAD equipment issues. VAD coordinator  reviewed daily log from home for daily temperature, weight, and VAD parameters. Pt is performing daily controller and system monitor self tests along with completing weekly and monthly maintenance for LVAD equipment.   LVAD equipment check completed and is in good working order. Back-up equipment present. LVAD education done on emergency procedures and precautions and reviewed exit site care.   Back up controller 11V battery charged per [employer] recommendation during this visit.  Pt completed 6 mos Intermacs f/u including EQ-5D-3L QOL, KCCQ-12, and  neurocognitive trai lmaking. 6 min walk completed 1325 feet without stopping.

## 2014-04-02 ENCOUNTER — Other Ambulatory Visit (HOSPITAL_COMMUNITY): Payer: Self-pay | Admitting: Anesthesiology

## 2014-04-16 ENCOUNTER — Ambulatory Visit (HOSPITAL_COMMUNITY): Payer: Self-pay | Admitting: *Deleted

## 2014-04-16 ENCOUNTER — Other Ambulatory Visit (INDEPENDENT_AMBULATORY_CARE_PROVIDER_SITE_OTHER): Payer: Medicare Other

## 2014-04-16 DIAGNOSIS — Z95811 Presence of heart assist device: Secondary | ICD-10-CM

## 2014-04-16 DIAGNOSIS — Z7901 Long term (current) use of anticoagulants: Secondary | ICD-10-CM

## 2014-04-16 LAB — PROTIME-INR
INR: 2.7 ratio — ABNORMAL HIGH (ref 0.8–1.0)
Prothrombin Time: 28.8 s — ABNORMAL HIGH (ref 9.6–13.1)

## 2014-04-25 ENCOUNTER — Other Ambulatory Visit (HOSPITAL_COMMUNITY): Payer: Self-pay | Admitting: *Deleted

## 2014-04-25 ENCOUNTER — Ambulatory Visit (HOSPITAL_COMMUNITY)
Admission: RE | Admit: 2014-04-25 | Discharge: 2014-04-25 | Disposition: A | Discharge status: Home / Self Care | Payer: Medicare Other | Source: Ambulatory Visit | Attending: Internal Medicine | Admitting: Internal Medicine

## 2014-04-25 ENCOUNTER — Ambulatory Visit (HOSPITAL_COMMUNITY): Payer: Self-pay | Admitting: *Deleted

## 2014-04-25 ENCOUNTER — Encounter (HOSPITAL_COMMUNITY): Payer: Self-pay

## 2014-04-25 VITALS — BP 122/0 | HR 89 | Ht 69.0 in | Wt 158.6 lb

## 2014-04-25 DIAGNOSIS — I2589 Other forms of chronic ischemic heart disease: Secondary | ICD-10-CM | POA: Diagnosis not present

## 2014-04-25 DIAGNOSIS — M1990 Unspecified osteoarthritis, unspecified site: Secondary | ICD-10-CM | POA: Diagnosis not present

## 2014-04-25 DIAGNOSIS — Z95811 Presence of heart assist device: Secondary | ICD-10-CM | POA: Insufficient documentation

## 2014-04-25 DIAGNOSIS — I252 Old myocardial infarction: Secondary | ICD-10-CM | POA: Diagnosis not present

## 2014-04-25 DIAGNOSIS — E039 Hypothyroidism, unspecified: Secondary | ICD-10-CM | POA: Insufficient documentation

## 2014-04-25 DIAGNOSIS — I2510 Atherosclerotic heart disease of native coronary artery without angina pectoris: Secondary | ICD-10-CM | POA: Insufficient documentation

## 2014-04-25 DIAGNOSIS — Z96659 Presence of unspecified artificial knee joint: Secondary | ICD-10-CM | POA: Insufficient documentation

## 2014-04-25 DIAGNOSIS — I5022 Chronic systolic (congestive) heart failure: Secondary | ICD-10-CM | POA: Diagnosis present

## 2014-04-25 DIAGNOSIS — I509 Heart failure, unspecified: Secondary | ICD-10-CM | POA: Insufficient documentation

## 2014-04-25 DIAGNOSIS — Z7982 Long term (current) use of aspirin: Secondary | ICD-10-CM | POA: Diagnosis not present

## 2014-04-25 DIAGNOSIS — I4510 Unspecified right bundle-branch block: Secondary | ICD-10-CM | POA: Insufficient documentation

## 2014-04-25 DIAGNOSIS — E7800 Pure hypercholesterolemia, unspecified: Secondary | ICD-10-CM | POA: Insufficient documentation

## 2014-04-25 DIAGNOSIS — Z950 Presence of cardiac pacemaker: Secondary | ICD-10-CM | POA: Diagnosis not present

## 2014-04-25 DIAGNOSIS — Z7901 Long term (current) use of anticoagulants: Secondary | ICD-10-CM | POA: Insufficient documentation

## 2014-04-25 DIAGNOSIS — I10 Essential (primary) hypertension: Secondary | ICD-10-CM | POA: Diagnosis not present

## 2014-04-25 DIAGNOSIS — I4949 Other premature depolarization: Secondary | ICD-10-CM | POA: Insufficient documentation

## 2014-04-25 DIAGNOSIS — I255 Ischemic cardiomyopathy: Secondary | ICD-10-CM

## 2014-04-25 DIAGNOSIS — F411 Generalized anxiety disorder: Secondary | ICD-10-CM | POA: Diagnosis not present

## 2014-04-25 LAB — CBC
HCT: 38.5 % — ABNORMAL LOW (ref 39.0–52.0)
Hemoglobin: 12 g/dL — ABNORMAL LOW (ref 13.0–17.0)
MCH: 28 pg (ref 26.0–34.0)
MCHC: 31.2 g/dL (ref 30.0–36.0)
MCV: 90 fL (ref 78.0–100.0)
Platelets: 176 10*3/uL (ref 150–400)
RBC: 4.28 MIL/uL (ref 4.22–5.81)
RDW: 16.7 % — ABNORMAL HIGH (ref 11.5–15.5)
WBC: 5.8 10*3/uL (ref 4.0–10.5)

## 2014-04-25 LAB — BASIC METABOLIC PANEL
Anion gap: 14 (ref 5–15)
BUN: 22 mg/dL (ref 6–23)
CO2: 23 mEq/L (ref 19–32)
Calcium: 8.9 mg/dL (ref 8.4–10.5)
Chloride: 103 mEq/L (ref 96–112)
Creatinine, Ser: 1.2 mg/dL (ref 0.50–1.35)
GFR calc Af Amer: 69 mL/min — ABNORMAL LOW (ref >90)
GFR calc non Af Amer: 60 mL/min — ABNORMAL LOW (ref >90)
Glucose, Bld: 81 mg/dL (ref 70–99)
Potassium: 4.1 mEq/L (ref 3.7–5.3)
Sodium: 140 mEq/L (ref 137–147)

## 2014-04-25 LAB — LACTATE DEHYDROGENASE: LDH: 261 U/L — ABNORMAL HIGH (ref 94–250)

## 2014-04-25 LAB — PROTIME-INR
INR: 3 — ABNORMAL HIGH (ref 0.00–1.49)
Prothrombin Time: 31.1 seconds — ABNORMAL HIGH (ref 11.6–15.2)

## 2014-04-25 LAB — PRO B NATRIURETIC PEPTIDE: Pro B Natriuretic peptide (BNP): 3481 pg/mL — ABNORMAL HIGH (ref 0–125)

## 2014-04-25 NOTE — Progress Notes (Signed)
Patient ID: Johnny Oneill, male   DOB: September 20, 1940, 73 y.o.   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   Admitted 3/4-3/25/15 with SOB and syncopal episode. Found to have SBO and NG placed and started on TPN. On 11/06/13 developed progressive SOB and hypotension and co-ox 51% and Hgb 6.7. Started on milrinone and transfused. Echo showed nl RV function. Patient had PEA arrest and was intubated and started on pressors. CXR revelaed a R hemothorax and a R CT placed. He required VATs  and evacuation of hemothorax and then tansferred to CIR.   Patient saw Dr. Rayann Heman 01/24/14 for device follow up. Per note: pt is stable and on an appropriate medical regimen. Normal ICD function. R waves have decreased since LVAD placement; CXR revealed stable lead position. He plans to follow clinically and try to avoid lead revision if possible.  Follow up for Heart Failure: Last visit increased hydralazine in the am to 37.5 mg and continued other doses 25 mg at lunch and dinner. Doing well. Denies SOB, orthopnea, PND or CP. Has taken lasix two times. Gets minimal SOB after walking greater than 1/4-1/2 mile. Has some pain on chest from where he twisted quickly to avoid getting hit by volleyball. The next day had mild fever.  MAPs at home 104-112. Drinking less than 2L a day. Now lasted as status 1b on [facility] list.   Reports taking Coumadin as prescribed and adherence to anticoagulation based dietary restrictions.  Denies bright red blood per rectum or melena, no dark urine or hematuria.    Patient had [place] in [id number] complicated by suspected right hemothorax (used RIJ access initially, RHC completed via right femoral - see numbers below).  Small pleural effusion only, unable to drain via chest tube.  [place] 6/15 With VAD 9200  RA = 4  RV = 33/2/6  PA = 33/14 (23)  PCW = 8  Fick cardiac  output/index = 6.5/3.7  PVR = 2.3 WU  FA sat = 94%  PA sat = 64%, 69%   Past Medical History  Diagnosis Date  . Ischemic cardiomyopathy     a. 10/09 Echo: Sev LV Dysfxn, inf/lat AK, Mod MR.  [name];  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 Hypertension   . PVC (premature ventricular contraction)   . Pacemaker   . CHF (congestive heart failure)   . Shortness of breath   . Automatic implantable cardioverter-defibrillator in situ     Current Outpatient Prescriptions  Medication Sig Dispense Refill  . aspirin EC 325 MG tablet Take 325 mg by mouth daily.      . citalopram (CELEXA) 10 MG tablet Take 1 tablet (10 mg total) by mouth daily.  90 tablet  2  . Coenzyme Q10 (COQ10) 200 MG CAPS Take 200 mg by mouth daily.      [name] ezetimibe (ZETIA) 10 MG tablet  Take 1 tablet (10 mg total) by mouth daily.  90 tablet  3  . ferrous Q000111Q C-folic acid (TRINSICON / FOLTRIN) capsule Take 1 capsule by mouth daily.       . furosemide (LASIX) 20 MG tablet Take 20 mg by mouth daily as needed (weight gain for 3 pounds in a day or 5 pounds in a week).       . hydrALAZINE (APRESOLINE) 25 MG tablet Take 25 mg by mouth 3 (three) times daily. Take 37.5 mg am; 25 mg noon; 25 mg bedtime      . levothyroxine (SYNTHROID) 175 MCG tablet Take 1 tablet (175 mcg total) by mouth daily before breakfast.  90 tablet  1  . Omega-3 300 MG CAPS Take 300 mg by mouth at bedtime.       . pantoprazole (PROTONIX) 40 MG tablet Take 1 tablet (40 mg total) by mouth daily.  30 tablet  1  . rosuvastatin (CRESTOR) 20 MG tablet Take 1 tablet (20 mg total) by mouth daily.  90 tablet  3   . temazepam (RESTORIL) 15 MG capsule Take 1 capsule (15 mg total) by mouth at bedtime.  30 capsule  3  . warfarin (COUMADIN) 5 MG tablet Take 5 mg by mouth daily.       [name] warfarin (COUMADIN) 5 MG tablet TAKE ONE TABLET BY MOUTH ONCE DAILY AT 6 PM  30 tablet  0   No current facility-administered medications for this encounter.   Facility-Administered Medications Ordered in Other Encounters  Medication Dose Route Frequency Provider Last Rate Last Dose  . etomidate (AMIDATE) injection    Anesthesia Intra-op Sammuel Cooper Mumm, CRNA   20 mg at 02/08/14 0915  . rocuronium [facility]) injection    Anesthesia Intra-op Sammuel Cooper Mumm, CRNA   50 mg at 02/08/14 0932  . succinylcholine (ANECTINE) injection    Anesthesia Intra-op Sammuel Cooper Mumm, CRNA   100 mg at 02/08/14 0915    Ace inhibitors; Diovan; Lipitor; and Norvasc  REVIEW OF SYSTEMS: All systems negative except as listed in HPI, PMH and Problem list.  LVAD interrogation reveals:  Speed:  9200 Flow:  3.8 Power: 4.9 PI: 7.1 Alarms:  none Events: occasional PI event Fixed speed:  9200  RPM Low speed limit:  8600 RPM  I reviewed the LVAD parameters from today, and compared the results to the patient's prior recorded data.  No programming changes were made.  The LVAD is functioning within specified parameters.  The patient performs LVAD self-test daily.  LVAD interrogation was negative for any significant power changes, alarms or PI events/speed drops.  LVAD equipment check completed and is in good working order.  Back-up equipment present.   LVAD education done on emergency procedures and precautions and reviewed exit site care.    Filed Vitals:   04/25/14 1120  BP: 122/0  Pulse: 89  Height: 5\' 9"  (1.753 m)  Weight: 158 lb 9.6 oz (71.94 kg)  SpO2: 98%    Physical Exam: GENERAL: Well appearing, male who presents to clinic today in no acute distress; Wife present HEENT: normal  NECK: Supple, JVP not elevated, no bruits.  No  lymphadenopathy or thyromegaly appreciated.    CARDIAC:  Mechanical heart sounds with LVAD hum present.  LUNGS:  Clear to auscultation bilaterally.  ABDOMEN:  Soft, round, nontender, positive bowel sounds x4.     LVAD exit site: well-healed and semi incorporated.  Dressing dry and intact.  No erythema or drainage.  Stabilization device  present and accurately applied.  Driveline dressing is being changed weekly per sterile technique. EXTREMITIES:  Warm and dry, no cyanosis, clubbing, rash. Trace lower extremity edema  NEUROLOGIC:  Alert and oriented x 4.  Gait steady.  No aphasia.  No dysarthria.  Affect pleasant.     ASSESSMENT AND PLAN:   1) Chronic systolic HF: ICM, EF 0000000 s/p LVAD HM II implant 08/2013.   RHC looked good earlier this month.  - NYHA II symptoms and volume status stable. Will continue lasix PRN for weight gain more than 3 lbs in a day or 5 lbs in a week.  - Not on BB with history of RV failure.  - MAP remains elevated. Wife purchased doppler and is running 104-112 at home. Patient is very sensitive to medication changes. Will continue hydralazine 37.5 am and increase mid day hydralazine to 37.5 mg and continue 25 mg at dinner. Instructed to call if any dizziness or change in symptoms. If wife notices continued elevated MAP will increase to 37.5 mg TID.  - He has not tolerated ACE-I, Spiro or norvasc in the past. - He is now on status 1b list. Patient still concerned about moving forward with tx. Coordinator at [facility] called and said maybe when he gets back from his trip to use his 30 day 1A time. Will call team and discuss with them because with his age approaching 47 he may get taken off tx list. - Reinforced the need and importance of daily weights, a low sodium diet, and fluid restriction (less than 2 L a day). Instructed to call the HF clinic if weight increases more than 3 lbs overnight or 5 lbs in a week.  2) LVAD:  - Doing well. All LVAD parameters nl. Will check LDH, CBD,  INR and CMET today.  - Currently listed as 1B. 3) Chronic anticoagulation: INR goal 2.0-3.0. No bleeding issues. Adjust coumadin accordingly. Continue ASA 325 mg for LVAD. 4) HTN: MAP better on higher hydralazine, but still a little elevated. As above increase middle day dose of hydralazine to 37.5 mg and continue 25 mg at dinner.  5) Hypothyroidism: Per Dr Dwyane Dee.    F/U 6 weeks.  Junie Bame [facility] 04/25/2014

## 2014-04-25 NOTE — Patient Instructions (Signed)
1.  Increase hydralazine to 37.5 mg am and noon; 25 mg pm.  If no dizziness, and BP remains high, call VAD clinic.  2.  Decrease coumadin dose to 5 mg daily. Re-check INR in two weeks 05/09/14. 3.  Return to [place] clinic in 6 weeks.

## 2014-04-25 NOTE — Progress Notes (Signed)
Symptom  Yes  No  Details   Angina        x Ativity:   Claudication        x How far:   Syncope              x When:    Stroke        x   Orthopnea        x How many pillows:   one  PND        x How often:    CPAP    N/A  How many hrs:   Pedal edema              x    Abd fullness              x   N&V        x good appetite; eating full meals  Diaphoresis        x When:  Bleeding           x     Urine color        light yellow  SOB            x Activity:   Walking level ground 1/2 mile; incline  Palpitations        x When:   ICD shock        x   Hospitlizaitons              x    When/where/why:   ED visit        x When/where/why:  Other MD        x       When/who/why:  [facility] social worker; listed 1B 04/19/14  Activity    Chores; working at [employer]  Fluid    No limitations; drinking 1 liter day  Diet     Low salt   Vital signs: HR: 89 MAP BP:  122; 118 O2 Sat:  98 Wt:   158.6  lbs Last weight:  155.6  lbs Ht: 5'9"  LVAD interrogation reveals:  Speed:  9200 Flow:  4.1 Power: 5.0 PI: 7.5 Alarms:  none Events:  none Fixed speed:  9200  RPM Low speed limit:  8600 RPM  LVAD exit site:   Driveline completely healed and incorporated. The velour is fully implanted at exit site. Sorbaview dressing with biopatch intact. Stabilization device present and accurately applied. Driveline dressing is being changed weekly per sterile technique using sorbaview dressing. Pt denies fever or chills. Pt has started showers using clear seal plastic wrap to cover dressing without problems. [name] reports no redness, tenderness, drainage, or foul odor from exit site; continues weekly sterile dressing changes.   I reviewed the LVAD parameters from today, and compared the results to the patient's prior recorded data. The LVAD is functioning within specified parameters. LVAD interrogation was negative for any significant power changes or alarms.     Pt/caregiver deny any alarms or VAD equipment issues.  VAD coordinator reviewed daily log from home for daily temperature, weight, and VAD parameters. Pt is performing daily controller and system monitor self tests along with completing weekly and monthly maintenance for LVAD equipment.   LVAD equipment check completed and is in good working order. Back-up equipment present. LVAD education done on emergency procedures and precautions and reviewed exit site care.   Pt/wife planning upcoming trip inclusive of flight; will provide letter for airline. Also demonstrated proper steps for removing  12V battery from power module to conserve battery life during flight/long drives. Advised to take all VAD equipment in carry on luggage, do not check any VAD equipment to prevent loss. Both verbalized understanding of same.

## 2014-04-27 ENCOUNTER — Other Ambulatory Visit (HOSPITAL_COMMUNITY): Payer: Self-pay | Admitting: *Deleted

## 2014-04-27 DIAGNOSIS — G4700 Insomnia, unspecified: Secondary | ICD-10-CM

## 2014-04-27 MED ORDER — TEMAZEPAM 15 MG PO CAPS: 15.0000 mg | ORAL_CAPSULE | Freq: Every day | ORAL | Status: DC

## 2014-04-30 ENCOUNTER — Other Ambulatory Visit: Payer: Medicare Other

## 2014-04-30 ENCOUNTER — Other Ambulatory Visit (INDEPENDENT_AMBULATORY_CARE_PROVIDER_SITE_OTHER): Payer: Medicare Other

## 2014-04-30 DIAGNOSIS — E890 Postprocedural hypothyroidism: Secondary | ICD-10-CM

## 2014-04-30 LAB — TSH: TSH: 0.94 u[IU]/mL (ref 0.35–4.50)

## 2014-05-01 ENCOUNTER — Ambulatory Visit (INDEPENDENT_AMBULATORY_CARE_PROVIDER_SITE_OTHER): Payer: Medicare Other | Admitting: *Deleted

## 2014-05-01 ENCOUNTER — Telehealth: Payer: Self-pay | Admitting: Cardiology

## 2014-05-01 ENCOUNTER — Encounter: Payer: Self-pay | Admitting: Internal Medicine

## 2014-05-01 DIAGNOSIS — I2589 Other forms of chronic ischemic heart disease: Secondary | ICD-10-CM

## 2014-05-01 DIAGNOSIS — I255 Ischemic cardiomyopathy: Secondary | ICD-10-CM

## 2014-05-01 NOTE — Progress Notes (Signed)
Remote ICD transmission.   

## 2014-05-01 NOTE — Telephone Encounter (Signed)
LMOVM reminding pt to send remote transmission.   

## 2014-05-02 ENCOUNTER — Other Ambulatory Visit (HOSPITAL_COMMUNITY): Payer: Self-pay

## 2014-05-02 ENCOUNTER — Encounter: Payer: Self-pay | Admitting: Endocrinology

## 2014-05-02 ENCOUNTER — Ambulatory Visit (INDEPENDENT_AMBULATORY_CARE_PROVIDER_SITE_OTHER): Payer: Medicare Other | Admitting: Endocrinology

## 2014-05-02 VITALS — HR 100 | Temp 97.9°F | Resp 14 | Ht 68.0 in | Wt 150.0 lb

## 2014-05-02 DIAGNOSIS — E890 Postprocedural hypothyroidism: Secondary | ICD-10-CM

## 2014-05-02 DIAGNOSIS — I2589 Other forms of chronic ischemic heart disease: Secondary | ICD-10-CM

## 2014-05-02 LAB — MDC_IDC_ENUM_SESS_TYPE_REMOTE
Battery Voltage: 2.81 V
Brady Statistic AP VP Percent: 0.02 %
Brady Statistic AP VS Percent: 0.93 %
Brady Statistic AS VP Percent: 0 %
Brady Statistic AS VS Percent: 99.06 %
Brady Statistic RA Percent Paced: 0.94 %
Brady Statistic RV Percent Paced: 0.02 %
Date Time Interrogation Session: 20150901201143
HighPow Impedance: 34 Ohm
HighPow Impedance: 40 Ohm
Lead Channel Impedance Value: 356 Ohm
Lead Channel Impedance Value: 376 Ohm
Lead Channel Sensing Intrinsic Amplitude: 2.1363
Lead Channel Sensing Intrinsic Amplitude: 2.3296
Lead Channel Setting Pacing Amplitude: 2 V
Lead Channel Setting Pacing Amplitude: 3 V
Lead Channel Setting Pacing Pulse Width: 1.5 ms
Lead Channel Setting Sensing Sensitivity: 0.3 mV
Zone Setting Detection Interval: 300 ms
Zone Setting Detection Interval: 350 ms
Zone Setting Detection Interval: 350 ms
Zone Setting Detection Interval: 350 ms

## 2014-05-02 MED ORDER — WARFARIN SODIUM 5 MG PO TABS: ORAL_TABLET | ORAL | Status: DC

## 2014-05-02 NOTE — Progress Notes (Signed)
Patient ID: Johnny Oneill, male   DOB: 07-25-1941, 73 y.o.   MRN: [id number]   Reason for Appointment:  Hypothyroidism, followup visit    History of Present Illness:   The hypothyroidism was first diagnosed  after radioactive iodine treatment for [name] several years ago  Complaints are reported by the patient now are none. Previously he had lost significant amount of weight with his worsening heart failure this year  On a previous visit in 02/2013 his TSH was excellent at 1.7 with a regimen of 150 mcg Synthroid, 7-1/2 tablets per week  However his TSH level had been consistently high since 09/13/13         He was taking 200 mcg daily since about 3/15 when he was hospitalized  Now he has been on a steady dose of 175 mcg since 11/2013 TSH is consistent now  He is now feeling less weak and has good energy On a previous visit he was taking his medication with iron in the morning and was told to change this, he is now taking iron in evening  Compliance with the medical regimen has been as prescribed with taking the tablet in the morning before breakfast.  Lab Results  Component Value Date   TSH 0.94 04/30/2014   TSH 0.75 01/31/2014   TSH 7.760* 11/29/2013   TSH 26.457* 11/01/2013       Medication List       This list is accurate as of: 05/02/14 10:29 AM.  Always use your most recent med list.               amLODipine 2.5 MG tablet  Commonly known as:  NORVASC     aspirin EC 325 MG tablet  Take 325 mg by mouth daily.     citalopram 10 MG tablet  Commonly known as:  CELEXA  Take 1 tablet (10 mg total) by mouth daily.     CoQ10 200 MG Caps  Take 200 mg by mouth daily.     ezetimibe 10 MG tablet  Commonly known as:  ZETIA  Take 1 tablet (10 mg total) by mouth daily.     ferrous Q000111Q C-folic acid capsule  Commonly known as:  TRINSICON / FOLTRIN  Take 1 capsule by mouth daily.     furosemide 20 MG tablet  Commonly known as:  LASIX  Take 20 mg by  mouth daily as needed (weight gain for 3 pounds in a day or 5 pounds in a week).     hydrALAZINE 25 MG tablet  Commonly known as:  APRESOLINE  Take 25 mg by mouth 3 (three) times daily. Take 37.5 mg am; 37.5 mg noon; 25 mg bedtime     levothyroxine 175 MCG tablet  Commonly known as:  SYNTHROID  Take 1 tablet (175 mcg total) by mouth daily before breakfast.     Omega-3 300 MG Caps  Take 300 mg by mouth at bedtime.     pantoprazole 40 MG tablet  Commonly known as:  PROTONIX  Take 1 tablet (40 mg total) by mouth daily.     rosuvastatin 20 MG tablet  Commonly known as:  CRESTOR  Take 1 tablet (20 mg total) by mouth daily.     temazepam 15 MG capsule  Commonly known as:  RESTORIL  Take 1 capsule (15 mg total) by mouth at bedtime.     warfarin 5 MG tablet  Commonly known as:  COUMADIN  TAKE ONE TABLET BY MOUTH ONCE  DAILY AT 6 PM         Past Medical History  Diagnosis Date  . Ischemic cardiomyopathy     a. 10/09 Echo: Sev LV Dysfxn, inf/lat AK, Mod MR.  [name];  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 Hypertension   . PVC (premature ventricular contraction)   . Pacemaker   . CHF (congestive heart failure)   . Shortness of breath   . Automatic implantable cardioverter-defibrillator in situ     Past Surgical History  Procedure Laterality Date  . Defibrillator  2009  . Total knee arthroplasty  11/27/11    left  . Insert / replace / remove pacemaker  2009  . Coronary angioplasty with stent placement  2004  . Coronary angioplasty with stent placement  07/2008  . Knee arthroscopy w/ partial medial meniscectomy  12/2002    left  .  Knee arthroscopy      bilaterally  . Total knee arthroplasty  11/27/2011    Procedure: TOTAL KNEE ARTHROPLASTY;  Surgeon: Yvette Rack., MD;  Location: [facility];  Service: Orthopedics;  Laterality: Left;  left total knee arthroplasty  . Esophagogastroduodenoscopy N/A 09/15/2013    Procedure: ESOPHAGOGASTRODUODENOSCOPY (EGD);  Surgeon: Ladene Artist, MD;  Location: [facility] ENDOSCOPY;  Service: Endoscopy;  Laterality: N/A;  bedside  . Insertion of implantable left ventricular assist device N/A 09/18/2013    Procedure: INSERTION OF IMPLANTABLE LEFT VENTRICULAR ASSIST DEVICE;  Surgeon: Ivin Poot, MD;  Location: [place];  Service: Open Heart Surgery;  Laterality: N/A;  CIRC ARREST  NITRIC OXIDE  . Intraoperative transesophageal echocardiogram N/A 09/18/2013    Procedure: INTRAOPERATIVE TRANSESOPHAGEAL ECHOCARDIOGRAM;  Surgeon: Ivin Poot, MD;  Location: [place];  Service: Open Heart Surgery;  Laterality: N/A;  . Video assisted thoracoscopy Right 11/06/2013    Procedure: VIDEO ASSISTED THORACOSCOPY;  Surgeon: Gaye Pollack, MD;  Location: [place];  Service: Cardiothoracic;  Laterality: Right;  . Hematoma evacuation Right 11/06/2013    Procedure: EVACUATION HEMATOMA;  Surgeon: Gaye Pollack, MD;  Location: [place];  Service: Cardiothoracic;  Laterality: Right;    Family History  Problem Relation Age of Onset  . Heart failure Father   . Stroke Father   . Coronary artery disease Mother   . Heart disease Mother   . Hyperlipidemia Sister     Social History:  reports that he has never smoked. He has never used smokeless tobacco. He reports that he drinks alcohol. He reports that he does not use illicit drugs.  Allergies:  Allergies  Allergen Reactions  . Ace Inhibitors     Hypotension and elevated creatinine  . Diovan [Valsartan] Other (See Comments)    Hypotension at low dose, hyperkalemia  . Lipitor [Atorvastatin Calcium] Other (See Comments)    Muscle pain  . Norvasc [Amlodipine] Other (See  Comments)    Put patient to sleep   ROS:  Off lisinopril, now on Norvasc  Dyspnea on mod exertion  Appettite is good  Wt Readings from  Last 3 Encounters:  05/02/14 150 lb (68.04 kg)  04/25/14 158 lb 9.6 oz (71.94 kg)  03/28/14 155 lb 9.6 oz (70.58 kg)     Examination:   BP   Pulse 100  Temp(Src) 97.9 F (36.6 C)  Resp 14  Ht 5\' 8"  (1.727 m)  Wt 150 lb (68.04 kg)  BMI 22.81 kg/m2  SpO2 96%  He looks well Biceps reflexes are difficult to elicit but appear normal    Assessment/Plan:   Post ablative Hypothyroidism:  He has needed somewhat larger doses of his levothyroxine despite his weight loss in the last few months Subjectively he has felt better recently His TSH has been quite stable now with 175 mcg of the generic levothyroxine and will continue   [facility] 05/02/2014, 10:29 AM

## 2014-05-05 ENCOUNTER — Emergency Department (HOSPITAL_COMMUNITY)
Admission: EM | Admit: 2014-05-05 | Discharge: 2014-05-06 | Disposition: A | Discharge status: Home / Self Care | Payer: Medicare Other | Attending: Internal Medicine | Admitting: Internal Medicine

## 2014-05-05 ENCOUNTER — Encounter (HOSPITAL_COMMUNITY): Payer: Self-pay | Admitting: Emergency Medicine

## 2014-05-05 ENCOUNTER — Ambulatory Visit (HOSPITAL_COMMUNITY): Payer: Self-pay | Admitting: *Deleted

## 2014-05-05 DIAGNOSIS — Y9389 Activity, other specified: Secondary | ICD-10-CM | POA: Insufficient documentation

## 2014-05-05 DIAGNOSIS — Z95810 Presence of automatic (implantable) cardiac defibrillator: Secondary | ICD-10-CM | POA: Diagnosis not present

## 2014-05-05 DIAGNOSIS — Z7901 Long term (current) use of anticoagulants: Secondary | ICD-10-CM | POA: Insufficient documentation

## 2014-05-05 DIAGNOSIS — Z8673 Personal history of transient ischemic attack (TIA), and cerebral infarction without residual deficits: Secondary | ICD-10-CM | POA: Diagnosis not present

## 2014-05-05 DIAGNOSIS — E039 Hypothyroidism, unspecified: Secondary | ICD-10-CM | POA: Diagnosis not present

## 2014-05-05 DIAGNOSIS — I509 Heart failure, unspecified: Secondary | ICD-10-CM | POA: Diagnosis not present

## 2014-05-05 DIAGNOSIS — T675XXA Heat exhaustion, unspecified, initial encounter: Secondary | ICD-10-CM | POA: Insufficient documentation

## 2014-05-05 DIAGNOSIS — Y9229 Other specified public building as the place of occurrence of the external cause: Secondary | ICD-10-CM | POA: Insufficient documentation

## 2014-05-05 DIAGNOSIS — Z79899 Other long term (current) drug therapy: Secondary | ICD-10-CM | POA: Diagnosis not present

## 2014-05-05 DIAGNOSIS — I252 Old myocardial infarction: Secondary | ICD-10-CM | POA: Diagnosis not present

## 2014-05-05 DIAGNOSIS — R5381 Other malaise: Secondary | ICD-10-CM

## 2014-05-05 DIAGNOSIS — Z7982 Long term (current) use of aspirin: Secondary | ICD-10-CM | POA: Insufficient documentation

## 2014-05-05 DIAGNOSIS — R110 Nausea: Secondary | ICD-10-CM | POA: Insufficient documentation

## 2014-05-05 DIAGNOSIS — R531 Weakness: Secondary | ICD-10-CM

## 2014-05-05 DIAGNOSIS — F411 Generalized anxiety disorder: Secondary | ICD-10-CM | POA: Insufficient documentation

## 2014-05-05 DIAGNOSIS — I2510 Atherosclerotic heart disease of native coronary artery without angina pectoris: Secondary | ICD-10-CM | POA: Diagnosis not present

## 2014-05-05 DIAGNOSIS — R5383 Other fatigue: Secondary | ICD-10-CM

## 2014-05-05 DIAGNOSIS — X30XXXA Exposure to excessive natural heat, initial encounter: Secondary | ICD-10-CM | POA: Diagnosis not present

## 2014-05-05 DIAGNOSIS — I10 Essential (primary) hypertension: Secondary | ICD-10-CM | POA: Diagnosis not present

## 2014-05-05 DIAGNOSIS — E7800 Pure hypercholesterolemia, unspecified: Secondary | ICD-10-CM | POA: Diagnosis not present

## 2014-05-05 DIAGNOSIS — M1990 Unspecified osteoarthritis, unspecified site: Secondary | ICD-10-CM | POA: Insufficient documentation

## 2014-05-05 DIAGNOSIS — I5022 Chronic systolic (congestive) heart failure: Secondary | ICD-10-CM

## 2014-05-05 DIAGNOSIS — Z95811 Presence of heart assist device: Secondary | ICD-10-CM

## 2014-05-05 MED ORDER — CLONIDINE HCL 0.1 MG PO TABS
0.1000 mg | ORAL_TABLET | Freq: Once | ORAL | Status: AC
  Administered 2014-05-05: 0.1 mg via ORAL
  Filled 2014-05-05: qty 1

## 2014-05-05 NOTE — ED Provider Notes (Signed)
Patient seen primarily by Dr. Haroldine Laws in the ED. Not evaluated by EDP.  Julianne Rice, MD 05/05/14 [phone]

## 2014-05-05 NOTE — ED Notes (Signed)
Patient presents stating he was outside today and became hot and fatigue. LVAD patient

## 2014-05-05 NOTE — Consult Note (Signed)
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 Currently on the transplant list at [facility].  Has been doing well recently. Very active. Over past 2 days [place] fle[place] market walking a lot and feeling good. Today, after being at [place] all day walked back to the car and felt very hot and fatigued. Went home and slept. Woke up and felt weak and nauseated so wife brought him to ER. Denies fevers, chills, diarrhea, dysuria, CP, bleeding, dizziness or SOB. No problems with drive line.    In ER MAP 134 (has been running 100-105)  LVAD INTERROGATION:  HeartMate II LVAD:  Flow 3.8 liters/min, speed 9200, power 4.9, PI 7.2.  No PI events   Review of Systems: [y] = yes, [ ]  = no   General: Weight gain [ ] ; Weight loss [ ] ; Anorexia [ ] ; Fatigue Blue.Reese ]; Fever [ ] ; Chills [ ] ; Weakness Blue.Reese ]  Cardiac: Chest pain/pressure [ ] ; Resting SOB [ ] ; Exertional SOB [ ] ; Orthopnea [ ] ; Pedal Edema [ ] ; Palpitations [ ] ; Syncope [ ] ; Presyncope [ ] ; Paroxysmal nocturnal dyspnea[ ]   Pulmonary: Cough [ ] ; Wheezing[ ] ; Hemoptysis[ ] ; Sputum [ ] ; Snoring [ ]   GI: Vomiting[ ] ; Dysphagia[ ] ; Melena[ ] ; Hematochezia [ ] ; Heartburn[ ] ; Abdominal pain [ ] ; Constipation [ ] ; Diarrhea [ ] ; BRBPR [ ]   GU: Hematuria[ ] ; Dysuria [ ] ; Nocturia[ ]   Vascular: Pain in legs with walking [ ] ; Pain in feet with lying flat [ ] ; Non-healing sores [ ] ; Stroke [ ] ; TIA [ ] ; Slurred speech [ ] ;  Neuro: Headaches[ ] ; Vertigo[ ] ; Seizures[ ] ; Paresthesias[ ] ;Blurred vision [ ] ; Diplopia [ ] ; Vision changes [ ]   Ortho/Skin: Arthritis [ y]; Joint pain [ ] ; Muscle pain [ ] ; Joint swelling [ ] ; Back Pain [ ] ; Rash [ ]   Psych: Depression[ ] ; Anxiety[ ]   Heme: Bleeding problems [ ] ; Clotting disorders [ ] ; Anemia [ ]   Endocrine: Diabetes [ ] ; Thyroid dysfunction[ ]   Home Medications Prior to  Admission medications   Medication Sig Start Date End Date Taking? Authorizing Provider  aspirin EC 325 MG tablet Take 325 mg by mouth daily.   Yes Historical Provider, MD  citalopram (CELEXA) 10 MG tablet Take 1 tablet (10 mg total) by mouth daily. 03/28/14  Yes Rande Brunt, NP  Coenzyme Q10 (COQ10) 200 MG CAPS Take 200 mg by mouth daily.   Yes Historical Provider, MD  ezetimibe (ZETIA) 10 MG tablet Take 1 tablet (10 mg total) by mouth daily. 01/31/14  Yes Jolaine Artist, MD  ferrous [id number] C-folic acid (TRINSICON / FOLTRIN) capsule Take 1 capsule by mouth daily.  11/22/13  Yes Kmarion Rawl J Angiulli, PA-C  furosemide (LASIX) 20 MG tablet Take 20 mg by mouth daily as needed (weight gain for 3 pounds in a day or 5 pounds in a week).    Yes Historical Provider, MD  hydrALAZINE (APRESOLINE) 25 MG tablet Take 25-37.5 mg by mouth 3 (three) times daily. Take 37.5 mg am; 37.5 mg noon; 25 mg bedtime 02/12/14  Yes Amy D Clegg, NP  levothyroxine (SYNTHROID) 175 MCG tablet Take 1 tablet (175 mcg total) by mouth daily before breakfast. 02/16/14  Yes Elayne Snare, MD  pantoprazole (PROTONIX) 40 MG tablet Take 1 tablet (40 mg total) by mouth  daily. 11/22/13  Yes Iker Nuttall J Angiulli, PA-C  rosuvastatin (CRESTOR) 20 MG tablet Take 1 tablet (20 mg total) by mouth daily. 01/31/14  Yes Shaune Pascal Rivers Gassmann, MD  temazepam (RESTORIL) 15 MG capsule Take 1 capsule (15 mg total) by mouth at bedtime. 04/27/14  Yes Ivin Poot, MD  warfarin (COUMADIN) 5 MG tablet Take 5 mg by mouth daily at 6 PM.   Yes Historical Provider, MD    Past Medical History: Past Medical History  Diagnosis Date  . Ischemic cardiomyopathy     a. 10/09 Echo: Sev LV Dysfxn, inf/lat AK, Mod MR.  [name];  s/p Lat MI 2009 with occlusion of the LCX - treated with Promus stenting. Has total  occlusion of the RCA.   . ICD (implantable cardiac defibrillator) in place     PROPHYLACTIC      medtronic  . RBBB   . 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 Hypertension   . PVC (premature ventricular contraction)   . Pacemaker   . CHF (congestive heart failure)   . Shortness of breath   . Automatic implantable cardioverter-defibrillator in situ     Past Surgical History: Past Surgical History  Procedure Laterality Date  . Defibrillator  2009  . Total knee arthroplasty  11/27/11    left  . Insert / replace / remove pacemaker  2009  . Coronary angioplasty with stent placement  2004  . Coronary angioplasty with stent placement  07/2008  . Knee arthroscopy w/ partial medial meniscectomy  12/2002    left  . Knee arthroscopy      bilaterally  . Total knee arthroplasty  11/27/2011    Procedure: TOTAL KNEE ARTHROPLASTY;  Surgeon: Yvette Rack., MD;  Location: [facility];  Service: Orthopedics;  Laterality: Left;  left total knee arthroplasty  . Esophagogastroduodenoscopy N/A 09/15/2013    Procedure: ESOPHAGOGASTRODUODENOSCOPY (EGD);  Surgeon: Ladene Artist, MD;  Location: [facility] ENDOSCOPY;  Service: Endoscopy;  Laterality: N/A;  bedside  . Insertion of implantable left ventricular assist device N/A 09/18/2013    Procedure: INSERTION OF IMPLANTABLE LEFT VENTRICULAR ASSIST DEVICE;  Surgeon: Ivin Poot, MD;  Location: [facility];  Service: Open Heart Surgery;  Laterality: N/A;  CIRC ARREST  NITRIC OXIDE  . Intraoperative transesophageal echocardiogram N/A 09/18/2013    Procedure: INTRAOPERATIVE TRANSESOPHAGEAL ECHOCARDIOGRAM;  Surgeon: Ivin Poot, MD;  Location: [facility];  Service: Open Heart Surgery;  Laterality: N/A;  . Video assisted thoracoscopy Right 11/06/2013    Procedure: VIDEO ASSISTED THORACOSCOPY;  Surgeon: Gaye Pollack, MD;  Location: [facility] OR;  Service: Cardiothoracic;  Laterality: Right;  . Hematoma evacuation Right 11/06/2013    Procedure: EVACUATION HEMATOMA;  Surgeon: Gaye Pollack, MD;   Location: [facility] OR;  Service: Cardiothoracic;  Laterality: Right;    Family History: Family History  Problem Relation Age of Onset  . Heart failure Father   . Stroke Father   . Coronary artery disease Mother   . Heart disease Mother   . Hyperlipidemia Sister     Social History: History   Social History  . Marital Status: Married    Spouse Name: N/A    Number of Children: 1  . Years of Education: N/A   Occupational History  . referee   .  cleaning company    Social History Main Topics  . Smoking status: Never Smoker   . Smokeless tobacco: Never Used  . Alcohol Use: Yes     Comment: "occassionlly; last beer was 09/2011"  . Drug Use: No     Comment: "back in our younger days we smoked a little pot"  . Sexual Activity: Not Currently   Other Topics Concern  . None   Social History Narrative  . None    Allergies:  Allergies  Allergen Reactions  . Ace Inhibitors     Hypotension and elevated creatinine  . Diovan [Valsartan] Other (See Comments)    Hypotension at low dose, hyperkalemia  . Lipitor [Atorvastatin Calcium] Other (See Comments)    Muscle pain  . Norvasc [Amlodipine] Other (See Comments)    Put patient to sleep    Objective:    Vital Signs:   Temp:  [98.1 F (36.7 C)] 98.1 F (36.7 C) (09/05 2245) Pulse Rate:  [104] 104 (09/05 2300) Resp:  [21] 21 (09/05 2300) SpO2:  [96 %-97 %] 96 % (09/05 2300)   There were no vitals filed for this visit.  Mean arterial Pressure 134  Physical Exam: GENERAL: Well appearing, malein no acute distress; Wife present  HEENT: normal  NECK: Supple, JVP 9-10, no bruits. No lymphadenopathy or thyromegaly appreciated.  CARDIAC: Mechanical heart sounds with LVAD hum present.  LUNGS: Clear to auscultation bilaterally.  ABDOMEN: Soft, round, nontender, positive bowel sounds x4.  LVAD exit site: well-healed and semi incorporated. Dressing dry and intact. No erythema or drainage. Stabilization device present and accurately  applied. Driveline dressing is being changed weekly per sterile technique.  EXTREMITIES: Warm and dry, no cyanosis, clubbing, rash. Trace lower extremity edema  NEUROLOGIC: Alert and oriented x 4. Gait steady. No aphasia. No dysarthria. Affect pleasant.    Telemetry: NSR 90-100  Labs: Basic Metabolic Panel: No results found for this basename: NA, K, CL, CO2, GLUCOSE, BUN, CREATININE, CALCIUM, MG, PHOS,  in the last 168 hours  Liver Function Tests: No results found for this basename: AST, ALT, ALKPHOS, BILITOT, PROT, ALBUMIN,  in the last 168 hours No results found for this basename: LIPASE, AMYLASE,  in the last 168 hours No results found for this basename: AMMONIA,  in the last 168 hours  CBC: No results found for this basename: WBC, NEUTROABS, HGB, HCT, MCV, PLT,  in the last 168 hours  Cardiac Enzymes: No results found for this basename: CKTOTAL, CKMB, CKMBINDEX, TROPONINI,  in the last 168 hours  BNP: BNP (last 3 results)  Recent Labs  02/19/14 1009 03/28/14 1403 04/25/14 1058  PROBNP 2350.0* 2663.0* 3481.0*    CBG: No results found for this basename: GLUCAP,  in the last 168 hours  Coagulation Studies: No results found for this basename: LABPROT, INR,  in the last 72 hours  Other results: EKG: ST 105 RBBB  Imaging:  No results found.    Assessment:   1. Fatigue 2. Weakness 3. Chronic systolic HF s/p HM II VAD 4. HTN   Plan/Discussion:    I suspect he overdid it in the heat and has component of mild heat exhaustion. He appears volume replete. Will check labs and give one dose of clonidine for HTN. If labs normal can go home with close f/u in HF Clinic.   I reviewed the LVAD parameters from today, and compared the results to the patient's prior recorded data.  No programming changes were made.  The LVAD is functioning  within specified parameters.  The patient performs LVAD self-test daily.  LVAD interrogation was negative for any significant power  changes, alarms or PI events/speed drops.  LVAD equipment check completed and is in good working order.  Back-up equipment present.   LVAD education done on emergency procedures and precautions and reviewed exit site care.  Length of Stay: 0  Glori Bickers MD 05/05/2014, 11:30 PM  VAD Team Pager [phone] (7am - 7am) +++VAD ISSUES ONLY+++ Advanced Heart Failure Team Pager [phone] (M-F; [place])  Please contact [facility] for night-coverage after hours (4p -7a ) and weekends on [email or web address] for all non- LVAD Issues

## 2014-05-06 DIAGNOSIS — T675XXA Heat exhaustion, unspecified, initial encounter: Secondary | ICD-10-CM | POA: Diagnosis not present

## 2014-05-06 LAB — URINE MICROSCOPIC-ADD ON

## 2014-05-06 LAB — URINALYSIS, ROUTINE W REFLEX MICROSCOPIC
Bilirubin Urine: NEGATIVE
Glucose, UA: NEGATIVE mg/dL
Hgb urine dipstick: NEGATIVE
Ketones, ur: NEGATIVE mg/dL
Leukocytes, UA: NEGATIVE
Nitrite: NEGATIVE
Protein, ur: 100 mg/dL — AB
Specific Gravity, Urine: 1.027 (ref 1.005–1.030)
Urobilinogen, UA: 1 mg/dL (ref 0.0–1.0)
pH: 5.5 (ref 5.0–8.0)

## 2014-05-06 LAB — CBC WITH DIFFERENTIAL/PLATELET
Basophils Absolute: 0 10*3/uL (ref 0.0–0.1)
Basophils Relative: 0 % (ref 0–1)
Eosinophils Absolute: 0.1 10*3/uL (ref 0.0–0.7)
Eosinophils Relative: 1 % (ref 0–5)
HCT: 38 % — ABNORMAL LOW (ref 39.0–52.0)
Hemoglobin: 12.2 g/dL — ABNORMAL LOW (ref 13.0–17.0)
Lymphocytes Relative: 10 % — ABNORMAL LOW (ref 12–46)
Lymphs Abs: 0.8 10*3/uL (ref 0.7–4.0)
MCH: 28.6 pg (ref 26.0–34.0)
MCHC: 32.1 g/dL (ref 30.0–36.0)
MCV: 89 fL (ref 78.0–100.0)
Monocytes Absolute: 0.5 10*3/uL (ref 0.1–1.0)
Monocytes Relative: 6 % (ref 3–12)
Neutro Abs: 6.4 10*3/uL (ref 1.7–7.7)
Neutrophils Relative %: 83 % — ABNORMAL HIGH (ref 43–77)
Platelets: 194 10*3/uL (ref 150–400)
RBC: 4.27 MIL/uL (ref 4.22–5.81)
RDW: 17 % — ABNORMAL HIGH (ref 11.5–15.5)
WBC: 7.7 10*3/uL (ref 4.0–10.5)

## 2014-05-06 LAB — COMPREHENSIVE METABOLIC PANEL
ALT: 17 U/L (ref 0–53)
AST: 28 U/L (ref 0–37)
Albumin: 3.4 g/dL — ABNORMAL LOW (ref 3.5–5.2)
Alkaline Phosphatase: 128 U/L — ABNORMAL HIGH (ref 39–117)
Anion gap: 15 (ref 5–15)
BUN: 21 mg/dL (ref 6–23)
CO2: 20 mEq/L (ref 19–32)
Calcium: 8.6 mg/dL (ref 8.4–10.5)
Chloride: 100 mEq/L (ref 96–112)
Creatinine, Ser: 1.1 mg/dL (ref 0.50–1.35)
GFR calc Af Amer: 77 mL/min — ABNORMAL LOW (ref >90)
GFR calc non Af Amer: 66 mL/min — ABNORMAL LOW (ref >90)
Glucose, Bld: 154 mg/dL — ABNORMAL HIGH (ref 70–99)
Potassium: 3.6 mEq/L — ABNORMAL LOW (ref 3.7–5.3)
Sodium: 135 mEq/L — ABNORMAL LOW (ref 137–147)
Total Bilirubin: 1 mg/dL (ref 0.3–1.2)
Total Protein: 7.4 g/dL (ref 6.0–8.3)

## 2014-05-06 LAB — PROTIME-INR
INR: 2.92 — ABNORMAL HIGH (ref 0.00–1.49)
Prothrombin Time: 30.5 seconds — ABNORMAL HIGH (ref 11.6–15.2)

## 2014-05-06 LAB — LACTATE DEHYDROGENASE: LDH: 331 U/L — ABNORMAL HIGH (ref 94–250)

## 2014-05-06 NOTE — Progress Notes (Signed)
Call received at 2230 from [place] concerning Johnny Oneill en route to [facility]. Upon his arrival, power source brought to ED room and pt placed himself onto wall power. Entry site intact, no problems with drive line. Per pt and Wife at bedside pt has been active over past 2 days. [place] felt very [place] and fatigued. Following a nap at home he awakened weak and nauseated at which point he came to the ED. Pt found resting in ED stretcher. Denies pain or SOB. MAP obtained in left arm yielding 130, per wife his normal is around 100. Dr.Bensimhon at bedside to see pt shortly after his arrival. Labs done and clonidine given po. Map obtained at [place] yielding 120. Per Dr.Bensimhon pt to be discharged pending evaluation of his lab results. Awaiting discharge orders.

## 2014-05-06 NOTE — Progress Notes (Addendum)
Pt discharged per ED RN. Asisted pt to get dressed and put LVAD onto battery power. Back up equipment at patient's side. Pt wheeled out to Wife's vehicle and discharged to her care.

## 2014-05-06 NOTE — Discharge Instructions (Signed)
Heat-Related Illness °Heat-related illnesses occur when the body is unable to properly cool itself. The body normally cools itself by sweating. However, under some conditions sweating is not enough. In these cases, a person's body temperature rises rapidly. Very high body temperatures may damage the brain or other vital organs. Some examples of heat-related illnesses include: °· Heat stroke. This occurs when the body is unable to regulate its temperature. The body's temperature rises rapidly, the sweating mechanism fails, and the body is unable to cool down. Body temperature may rise to 106° F (41° C) or higher within 10 to 15 minutes. Heat stroke can cause death or permanent disability if emergency treatment is not provided. °· Heat exhaustion. This is a milder form of heat-related illness that can develop after several days of exposure to high temperatures and not enough fluids. It is the body's response to an excessive loss of the water and salt contained in sweat. °· Heat cramps. These usually affect people who sweat a lot during heavy activity. This sweating drains the body's salt and moisture. The low salt level in the muscles causes painful cramps. Heat cramps may also be a symptom of heat exhaustion. Heat cramps usually occur in the abdomen, arms, or legs. Get medical attention for cramps if you have heart problems or are on a low-sodium diet. °Those that are at greatest risk for heat-related illnesses include:  °· The elderly. °· Infant and the very young. °· People with mental illness and chronic diseases. °· People who are overweight (obese). °· Young and healthy people can even succumb to heat if they participate in strenuous physical activities during hot weather. °CAUSES  °Several factors affect the body's ability to cool itself during extremely hot weather. When the humidity is high, sweat will not evaporate as quickly. This prevents the body from releasing heat quickly. Other factors that can affect  the body's ability to cool down include:  °· Age. °· Obesity. °· Fever. °· Dehydration. °· Heart disease. °· Mental illness. °· Poor circulation. °· Sunburn. °· Prescription drug use. °· Alcohol use. °SYMPTOMS  °Heat stroke: Warning signs of heat stroke vary, but may include: °· An extremely high body temperature (above 103°F orally). °· A fast, strong pulse. °· Dizziness. °· Confusion. °· Red, hot, and dry skin. °· No sweating. °· Throbbing headache. °· Feeling sick to your stomach (nauseous). °· Unconsciousness. °Heat exhaustion: Warning signs of heat exhaustion include: °· Heavy sweating. °· Tiredness. °· Headache. °· Paleness. °· Weakness. °· Feeling sick to your stomach (nauseous) or vomiting. °· Muscle cramps. °Heat cramps °· Muscle pains or spasms. °TREATMENT  °Heat stroke °· Get into a cool environment. An indoor place that is air-conditioned may be best. °· Take a cool shower or bath. Have someone around to make sure you are okay. °· Take your temperature. Make sure it is going down. °Heat exhaustion °· Drink plenty of fluids. Do not drink liquids that contain caffeine, alcohol, or large amounts of sugar. These cause you to lose more body fluid. Also, avoid very cold drinks. They can cause stomach cramps. °· Get into a cool environment. An indoor place that is air-conditioned may be best. °· Take a cool shower or bath. Have someone around to make sure you are okay. °· Put on lightweight clothing. °Heat cramps °· Stop whatever activity you were doing. Do not attempt to do that activity for at least 3 hours after the cramps have gone away. °· Get into a cool environment. An indoor   place that is air-conditioned may be best. °HOME CARE INSTRUCTIONS  °To protect your health when temperatures are extremely high, follow these tips: °· During heavy exercise in a hot environment, drink two to four glasses (16-32 ounces) of cool fluids each hour. Do not wait until you are thirsty to drink. Warning: If your caregiver  limits the amount of fluid you drink or has you on water pills, ask how much you should drink while the weather is hot. °· Do not drink liquids that contain caffeine, alcohol, or large amounts of sugar. These cause you to lose more body fluid. °· Avoid very cold drinks. They can cause stomach cramps. °· Wear appropriate clothing. Choose lightweight, light-colored, loose-fitting clothing. °· If you must be outdoors, try to limit your outdoor activity to morning and evening hours. Try to rest often in shady areas. °· If you are not used to working or exercising in a hot environment, start slowly and pick up the pace gradually. °· Stay cool in an air-conditioned place if possible. If your home does not have air conditioning, go to the shopping mall or public library. °· Taking a cool shower or bath may help you cool off. °SEEK MEDICAL CARE IF:  °· You see any of the symptoms listed above. You may be dealing with a life-threatening emergency. °· Symptoms worsen or last longer than 1 hour. °· Heat cramps do not get better in 1 hour. °MAKE SURE YOU:  °· Understand these instructions. °· Will watch your condition. °· Will get help right away if you are not doing well or get worse. °Document Released: 05/26/2008 Document Revised: 11/09/2011 Document Reviewed: 05/26/2008 °[employer]® Patient Information ©2015 [employer]. This information is not intended to replace advice given to you by your health care provider. Make sure you discuss any questions you have with your health care provider. ° °

## 2014-05-08 ENCOUNTER — Other Ambulatory Visit (HOSPITAL_COMMUNITY): Payer: Self-pay | Admitting: *Deleted

## 2014-05-08 DIAGNOSIS — Z7901 Long term (current) use of anticoagulants: Secondary | ICD-10-CM

## 2014-05-08 DIAGNOSIS — D649 Anemia, unspecified: Secondary | ICD-10-CM

## 2014-05-08 DIAGNOSIS — Z95811 Presence of heart assist device: Secondary | ICD-10-CM

## 2014-05-08 MED ORDER — WARFARIN SODIUM 5 MG PO TABS: 5.0000 mg | ORAL_TABLET | Freq: Every day | ORAL | Status: DC

## 2014-05-08 MED ORDER — FE FUMARATE-B12-VIT C-FA-IFC PO CAPS: 1.0000 | ORAL_CAPSULE | Freq: Every day | ORAL | Status: DC

## 2014-05-09 ENCOUNTER — Encounter: Payer: Self-pay | Admitting: Cardiology

## 2014-05-09 ENCOUNTER — Other Ambulatory Visit: Payer: Medicare Other

## 2014-05-11 ENCOUNTER — Ambulatory Visit (HOSPITAL_COMMUNITY)
Admission: RE | Admit: 2014-05-11 | Discharge: 2014-05-11 | Disposition: A | Discharge status: Home / Self Care | Payer: Medicare Other | Source: Ambulatory Visit | Attending: Internal Medicine | Admitting: Internal Medicine

## 2014-05-11 ENCOUNTER — Other Ambulatory Visit (HOSPITAL_COMMUNITY): Payer: Self-pay | Admitting: *Deleted

## 2014-05-11 ENCOUNTER — Encounter (HOSPITAL_COMMUNITY): Payer: Self-pay

## 2014-05-11 ENCOUNTER — Telehealth (HOSPITAL_COMMUNITY): Payer: Self-pay | Admitting: *Deleted

## 2014-05-11 VITALS — BP 122/0 | HR 93 | Ht 69.0 in | Wt 161.2 lb

## 2014-05-11 DIAGNOSIS — I10 Essential (primary) hypertension: Secondary | ICD-10-CM | POA: Diagnosis not present

## 2014-05-11 DIAGNOSIS — Z7901 Long term (current) use of anticoagulants: Secondary | ICD-10-CM | POA: Insufficient documentation

## 2014-05-11 DIAGNOSIS — E039 Hypothyroidism, unspecified: Secondary | ICD-10-CM | POA: Insufficient documentation

## 2014-05-11 DIAGNOSIS — I509 Heart failure, unspecified: Secondary | ICD-10-CM | POA: Insufficient documentation

## 2014-05-11 DIAGNOSIS — I428 Other cardiomyopathies: Secondary | ICD-10-CM | POA: Diagnosis not present

## 2014-05-11 DIAGNOSIS — I2589 Other forms of chronic ischemic heart disease: Secondary | ICD-10-CM

## 2014-05-11 DIAGNOSIS — Z95811 Presence of heart assist device: Secondary | ICD-10-CM

## 2014-05-11 DIAGNOSIS — I2510 Atherosclerotic heart disease of native coronary artery without angina pectoris: Secondary | ICD-10-CM | POA: Insufficient documentation

## 2014-05-11 DIAGNOSIS — I5022 Chronic systolic (congestive) heart failure: Secondary | ICD-10-CM | POA: Insufficient documentation

## 2014-05-11 DIAGNOSIS — E7800 Pure hypercholesterolemia, unspecified: Secondary | ICD-10-CM | POA: Insufficient documentation

## 2014-05-11 NOTE — Telephone Encounter (Signed)
Called pt's wife re: VAD center contact information for upcoming trip.  [facility] [phone] Fax: [phone]  Emergency VAD pager: [phone]  Will fax clinic note to VAD coordinator per her request.

## 2014-05-11 NOTE — Progress Notes (Signed)
Patient ID: EUFEMIO STFLEUR, male   DOB: 11-21-1940, 73 y.o.   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   Admitted 3/4-3/25/15 with SOB and syncopal episode. Found to have SBO and NG placed and started on TPN. On 11/06/13 developed progressive SOB and hypotension and co-ox 51% and Hgb 6.7. Started on milrinone and transfused. Echo showed nl RV function. Patient had PEA arrest and was intubated and started on pressors. CXR revelaed a R hemothorax and a R CT placed. He required VATs  and evacuation of hemothorax and then tansferred to CIR.   Patient saw Dr. Rayann Heman 01/24/14 for device follow up. Per note: pt is stable and on an appropriate medical regimen. Normal ICD function. R waves have decreased since LVAD placement; CXR revealed stable lead position. He plans to follow clinically and try to avoid lead revision if possible.  Patient had [place] in [id number] complicated by suspected right hemothorax (used RIJ access initially, RHC completed via right femoral - see numbers below).  Small pleural effusion only, unable to drain via chest tube.  [place] 6/15 With VAD 9200  RA = 4  RV = 33/2/6  PA = 33/14 (23)  PCW = 8  Fick cardiac output/index = 6.5/3.7  PVR = 2.3 WU  FA sat = 94%  PA sat = 64%, 69%  Follow up for Heart Failure: Last visit increased hydralazine to hydralazine 37.5 mg twice a day and 25 mg at night. Over the weekend came to the ER for weakness, nausea and MAP 130s at home. In the ER looked good and was thought to be from heat exhaustion from walking at [employer] for 2 days. Night after ER had low flow alarms the next morning (15). Minimal SOB the other day after walking up incline. Denies CP, orthopnea or PND. Took lasix on Tuesday and Wednesday. Weight at home 153-154 lbs.   Reports taking Coumadin as prescribed and adherence to anticoagulation based  dietary restrictions.  Denies bright red blood per rectum or melena, no dark urine or hematuria.     Past Medical History  Diagnosis Date  . Ischemic cardiomyopathy     a. 10/09 Echo: Sev LV Dysfxn, inf/lat AK, Mod MR.  [name];  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 Hypertension   . PVC (premature ventricular contraction)   . Pacemaker   . CHF (congestive heart failure)   . Shortness of breath   . Automatic implantable cardioverter-defibrillator in situ     Current Outpatient Prescriptions  Medication Sig Dispense Refill  . aspirin EC 325 MG tablet Take 325 mg by mouth daily.      . citalopram (CELEXA) 10 MG tablet Take 1 tablet (10 mg total) by mouth daily.  90 tablet  2  . Coenzyme Q10 (COQ10) 200 MG CAPS Take 200 mg by mouth daily.      [name]  ezetimibe (ZETIA) 10 MG tablet Take 1 tablet (10 mg total) by mouth daily.  90 tablet  3  . ferrous Q000111Q C-folic acid (TRINSICON / FOLTRIN) capsule Take 1 capsule by mouth daily.  30 capsule  6  . furosemide (LASIX) 20 MG tablet Take 20 mg by mouth daily as needed (weight gain for 3 pounds in a day or 5 pounds in a week).       . hydrALAZINE (APRESOLINE) 25 MG tablet Take 25-37.5 mg by mouth 3 (three) times daily. Take 37.5 mg am; 37.5 mg noon; 25 mg bedtime      . levothyroxine (SYNTHROID) 175 MCG tablet Take 1 tablet (175 mcg total) by mouth daily before breakfast.  90 tablet  1  . pantoprazole (PROTONIX) 40 MG tablet Take 1 tablet (40 mg total) by mouth daily.  30 tablet  1  . rosuvastatin (CRESTOR) 20 MG tablet Take 1 tablet (20 mg total) by mouth daily.  90 tablet  3  . temazepam  (RESTORIL) 15 MG capsule Take 1 capsule (15 mg total) by mouth at bedtime.  30 capsule  5  . warfarin (COUMADIN) 5 MG tablet Take 1 tablet (5 mg total) by mouth daily at 6 PM. Take one tab daily or as directed for INR 2 - 3  60 tablet  6   No current facility-administered medications for this encounter.   Facility-Administered Medications Ordered in Other Encounters  Medication Dose Route Frequency Provider Last Rate Last Dose  . etomidate (AMIDATE) injection    Anesthesia Intra-op Sammuel Cooper Mumm, CRNA   20 mg at 02/08/14 0915  . rocuronium [facility]) injection    Anesthesia Intra-op Sammuel Cooper Mumm, CRNA   50 mg at 02/08/14 0932  . succinylcholine (ANECTINE) injection    Anesthesia Intra-op Sammuel Cooper Mumm, CRNA   100 mg at 02/08/14 0915    Ace inhibitors; Diovan; Lipitor; and Norvasc  REVIEW OF SYSTEMS: All systems negative except as listed in HPI, PMH and Problem list.  LVAD interrogation reveals:  Speed:  9200 Flow:  4.0 Power: 4.9 PI: 7.1 Alarms:  none Events: None; 15 Low flow alarms on 05/06/14 Fixed speed:  9200  RPM Low speed limit:  8600 RPM  I reviewed the LVAD parameters from today, and compared the results to the patient's prior recorded data.  No programming changes were made.  The LVAD is functioning within specified parameters.  The patient performs LVAD self-test daily.  LVAD interrogation was negative for any significant power changes, alarms or PI events/speed drops.  LVAD equipment check completed and is in good working order.  Back-up equipment present.   LVAD education done on emergency procedures and precautions and reviewed exit site care.    Filed Vitals:   05/11/14 1039  BP: 122/0  Pulse: 93  Height: 5\' 9"  (1.753 m)  Weight: 161 lb 3.2 oz (73.12 kg)  SpO2: 98%    Physical Exam: GENERAL: Well appearing, male who presents to clinic today in no acute distress; Wife present HEENT: normal  NECK: Supple, JVP 8, no bruits.  No lymphadenopathy or thyromegaly  appreciated.    CARDIAC:  Mechanical heart sounds with LVAD hum present.  LUNGS:  Clear to auscultation bilaterally.  ABDOMEN:  Soft, round, nontender, positive bowel sounds x4.     LVAD exit site: well-healed and semi incorporated.  Dressing dry and intact.  No erythema or drainage.  Stabilization device present and accurately applied.  Driveline dressing is being changed weekly per sterile technique.  EXTREMITIES:  Warm and dry, no cyanosis, clubbing, rash. Trace lower extremity edema  NEUROLOGIC:  Alert and oriented x 4.  Gait steady.  No aphasia.  No dysarthria.  Affect pleasant.     ASSESSMENT AND PLAN:   1) Chronic systolic HF: ICM, EF 0000000 s/p LVAD HM II implant 08/2013.   RHC looked good earlier this month.  - NYHA II symptoms and volume status slightly elevated. Weight continues to trend up and on Optivol thoracic impendence down. Will start lasix 40 mg once a week with 20 meq of potassium.  - Not on BB with history of RV failure.  - MAP remains elevated. Wife purchased doppler and is running 120 at home. Patient is very sensitive to medication changes. Will increase hydralazine to 37.5 mg TID and discussed with wife if continues to be elevated can increase slowly to 50 mg TID first starting with just the evening dose and monitor MAP. - He has not tolerated ACE-I, Arlyce Harman or norvasc in the past. - He is now on status 1b list. When he gets back from vacation is going to discuss with [facility] about possibly using 30 day 1A time to see if he could get tx because with his age nearing 48 maybe taken off tx list once he is 75.  2) LVAD:  - Doing well.  - Had some low flow alarms on Sunday, likely related to high MAP. As above making some medication adjustments. - Labs from ED were stable.   - Currently listed as 1B. 3) Chronic anticoagulation: INR goal 2.0-3.0. No bleeding issues. Check INR next week. Continue ASA 325 mg for LVAD. 4) HTN: MAP remains elevated, refer to above.  5) Hypothyroidism:  Per Dr Dwyane Dee.    F/U 1 month Junie Bame [facility] 05/11/2014

## 2014-05-11 NOTE — Patient Instructions (Signed)
1.  Take Lasix 40 mg one time weekly. 2.   Take K-Dur 20 meq with Lasix dose. Take K-Dur 20 meq today. 3.  Increase Hydralazine to 37.5 mg three times daily. 4.  Check INR 05/21/14. 5.  Nearest VAD center to [facility], [place] will be [facility] in [place]; will get contact info for you. 6.  Return to clinic 1 month.

## 2014-05-14 ENCOUNTER — Telehealth (HOSPITAL_COMMUNITY): Payer: Self-pay | Admitting: Cardiology

## 2014-05-14 NOTE — Telephone Encounter (Signed)
Junie Bame, NP called and spoke w/pt's wife will continue 50 mg Three times a day for now will continue to monitor BP

## 2014-05-14 NOTE — Telephone Encounter (Signed)
pts wife called regarding B/P States she is unable to get SBP below 130 Increased hydralazine to 50 mg TID x 3 doses and BP did come down to 125-120 Today b/p 105 and pt is very fatigued  Please advise further

## 2014-05-17 ENCOUNTER — Telehealth (HOSPITAL_COMMUNITY): Payer: Self-pay | Admitting: *Deleted

## 2014-05-17 NOTE — Telephone Encounter (Signed)
Junie Bame, NP spoke w/pt's wife this am she is concerned that his BP is still 125, per Junie Bame, NP she will increase dose of Hydralazine to 75 mg in AM, 50 mg at lunch and 75 mg in PM, she will continue to monitor BP and call back if continues remain elevated

## 2014-05-21 ENCOUNTER — Other Ambulatory Visit (INDEPENDENT_AMBULATORY_CARE_PROVIDER_SITE_OTHER): Payer: Medicare Other

## 2014-05-21 ENCOUNTER — Ambulatory Visit (HOSPITAL_COMMUNITY): Payer: Self-pay | Admitting: *Deleted

## 2014-05-21 DIAGNOSIS — Z7901 Long term (current) use of anticoagulants: Secondary | ICD-10-CM

## 2014-05-21 DIAGNOSIS — Z95811 Presence of heart assist device: Secondary | ICD-10-CM

## 2014-05-21 LAB — PROTIME-INR
INR: 2.6 ratio — ABNORMAL HIGH (ref 0.8–1.0)
Prothrombin Time: 28.5 s — ABNORMAL HIGH (ref 9.6–13.1)

## 2014-05-30 ENCOUNTER — Other Ambulatory Visit (HOSPITAL_COMMUNITY): Payer: Self-pay | Admitting: *Deleted

## 2014-05-30 DIAGNOSIS — I10 Essential (primary) hypertension: Secondary | ICD-10-CM

## 2014-05-30 MED ORDER — HYDRALAZINE HCL 25 MG PO TABS: 37.5000 mg | ORAL_TABLET | Freq: Three times a day (TID) | ORAL | Status: DC

## 2014-06-05 ENCOUNTER — Other Ambulatory Visit (HOSPITAL_COMMUNITY): Payer: Self-pay | Admitting: *Deleted

## 2014-06-05 DIAGNOSIS — Z95811 Presence of heart assist device: Secondary | ICD-10-CM

## 2014-06-05 DIAGNOSIS — Z7901 Long term (current) use of anticoagulants: Secondary | ICD-10-CM

## 2014-06-06 ENCOUNTER — Encounter (HOSPITAL_COMMUNITY): Payer: Medicare Other

## 2014-06-07 ENCOUNTER — Ambulatory Visit (HOSPITAL_COMMUNITY): Payer: Self-pay | Admitting: *Deleted

## 2014-06-07 ENCOUNTER — Encounter: Payer: Self-pay | Admitting: Internal Medicine

## 2014-06-07 ENCOUNTER — Encounter (HOSPITAL_COMMUNITY): Payer: Self-pay | Admitting: *Deleted

## 2014-06-07 ENCOUNTER — Ambulatory Visit (HOSPITAL_COMMUNITY)
Admission: RE | Admit: 2014-06-07 | Discharge: 2014-06-07 | Disposition: A | Discharge status: Home / Self Care | Payer: Medicare Other | Source: Ambulatory Visit | Attending: Internal Medicine | Admitting: Internal Medicine

## 2014-06-07 VITALS — BP 114/0 | HR 89 | Ht 69.0 in | Wt 161.6 lb

## 2014-06-07 DIAGNOSIS — I5022 Chronic systolic (congestive) heart failure: Secondary | ICD-10-CM | POA: Insufficient documentation

## 2014-06-07 DIAGNOSIS — Z95811 Presence of heart assist device: Secondary | ICD-10-CM

## 2014-06-07 DIAGNOSIS — I10 Essential (primary) hypertension: Secondary | ICD-10-CM | POA: Insufficient documentation

## 2014-06-07 DIAGNOSIS — I2510 Atherosclerotic heart disease of native coronary artery without angina pectoris: Secondary | ICD-10-CM | POA: Diagnosis present

## 2014-06-07 DIAGNOSIS — Z7982 Long term (current) use of aspirin: Secondary | ICD-10-CM | POA: Insufficient documentation

## 2014-06-07 DIAGNOSIS — I255 Ischemic cardiomyopathy: Secondary | ICD-10-CM | POA: Insufficient documentation

## 2014-06-07 DIAGNOSIS — E780 Pure hypercholesterolemia: Secondary | ICD-10-CM | POA: Insufficient documentation

## 2014-06-07 DIAGNOSIS — Z7901 Long term (current) use of anticoagulants: Secondary | ICD-10-CM

## 2014-06-07 DIAGNOSIS — E039 Hypothyroidism, unspecified: Secondary | ICD-10-CM | POA: Diagnosis not present

## 2014-06-07 DIAGNOSIS — Z79899 Other long term (current) drug therapy: Secondary | ICD-10-CM | POA: Diagnosis not present

## 2014-06-07 DIAGNOSIS — F419 Anxiety disorder, unspecified: Secondary | ICD-10-CM | POA: Diagnosis not present

## 2014-06-07 DIAGNOSIS — Z95810 Presence of automatic (implantable) cardiac defibrillator: Secondary | ICD-10-CM | POA: Diagnosis not present

## 2014-06-07 LAB — PROTIME-INR
INR: 2.56 — ABNORMAL HIGH (ref 0.00–1.49)
Prothrombin Time: 27.5 seconds — ABNORMAL HIGH (ref 11.6–15.2)

## 2014-06-07 LAB — CBC
HCT: 38 % — ABNORMAL LOW (ref 39.0–52.0)
Hemoglobin: 12.1 g/dL — ABNORMAL LOW (ref 13.0–17.0)
MCH: 28.3 pg (ref 26.0–34.0)
MCHC: 31.8 g/dL (ref 30.0–36.0)
MCV: 89 fL (ref 78.0–100.0)
Platelets: 141 10*3/uL — ABNORMAL LOW (ref 150–400)
RBC: 4.27 MIL/uL (ref 4.22–5.81)
RDW: 19.6 % — ABNORMAL HIGH (ref 11.5–15.5)
WBC: 4.7 10*3/uL (ref 4.0–10.5)

## 2014-06-07 LAB — BASIC METABOLIC PANEL
Anion gap: 14 (ref 5–15)
BUN: 22 mg/dL (ref 6–23)
CO2: 21 mEq/L (ref 19–32)
Calcium: 9 mg/dL (ref 8.4–10.5)
Chloride: 103 mEq/L (ref 96–112)
Creatinine, Ser: 1.37 mg/dL — ABNORMAL HIGH (ref 0.50–1.35)
GFR calc Af Amer: 59 mL/min — ABNORMAL LOW (ref >90)
GFR calc non Af Amer: 51 mL/min — ABNORMAL LOW (ref >90)
Glucose, Bld: 107 mg/dL — ABNORMAL HIGH (ref 70–99)
Potassium: 4 mEq/L (ref 3.7–5.3)
Sodium: 138 mEq/L (ref 137–147)

## 2014-06-07 LAB — PRO B NATRIURETIC PEPTIDE: Pro B Natriuretic peptide (BNP): 3335 pg/mL — ABNORMAL HIGH (ref 0–125)

## 2014-06-07 LAB — LACTATE DEHYDROGENASE: LDH: 296 U/L — ABNORMAL HIGH (ref 94–250)

## 2014-06-07 MED ORDER — SPIRONOLACTONE 25 MG PO TABS: ORAL_TABLET | ORAL | Status: DC

## 2014-06-07 NOTE — Progress Notes (Signed)
  Symptom Yes No Details  Angina          x Activity:  Claudication          x How far:  Syncope          x When:  Stroke          x   Orthopnea          x How many pillows:  one  PND          x How often:  CPAP        N/A How many hrs:  Pedal edema          x   Abd fullness          x   N&V          x   Diaphoresis          x When:  Bleeding          x   Urine   Clear urine  SOB           x  Activity:  Incline  Palpitations          x When:  ICD shock          x   Hospitlizaitons          x When/where/why:  ED visit          x When/where/why:  Other MD         x  When/who/why:  Dr. Emelia Loron 06/06/14  Activity   Walking; referee games; working at [employer]  Fluid   < 2000   Diet   Low salt    Pulse:  89 MAP:  114;  Checked with automatic cuff: 119/90 (103) SPO2:  99 Weight: 161.6 lbs Last weight:   161.2 lbs  VAD interrogation revealed: Speed:  9200 Flow:   3.4 Power:  4.6 PI: 6.2 Alarms:  none Events:   none Fixed speed:  9200 Low speed limit:  8600 Primary Controller:  Replace back up battery in  24 months (11/2015).  [id number] version V7.23 Back up controller:   Replace back up battery in  24  Months (11/2015).  [id number] version V7.23  I reviewed the LVAD parameters from today, and compared the results to the patient's prior recorded data.  No programming changes were made.  The LVAD is functioning within specified parameters.  LVAD interrogation was negative for any significant power changes, alarms or PI events/speed drops.  LVAD equipment check completed and is in good working order.  Back-up equipment present.   LVAD education done on emergency procedures and precautions and reviewed exit site care.   Drive line exit site well healed and incorporated. The velour is fully implanted at exit site. Dressing dry and intact. No erythema, tenderness, or drainage. Stabilization device present and accurately applied. Driveline dressing is being changed weekly per sterile technique  using Sorbaview dressing with biopatch on exit site. Pt denies fever or chills. Pt provided with dressing supplies for home.   Pt/caregiver deny any alarms or VAD equipment issues. Pt is completing weekly and monthly maintenance for LVAD equipment. VAD coordinator reviewed daily log from home for daily temperature, weight, and VAD parameters. Pt is performing daily controller and system monitor self tests along with completing weekly and monthly maintenance for LVAD equipment.

## 2014-06-07 NOTE — Patient Instructions (Signed)
1.  Start Aldactone 12.5 mg every other day (take at night). 2.  Take extra Lasix 40 mg this week, then resume 40 mg one time weekly. 3.  Can stop K-Dur 4.  Re-check INR and BMP at 06/18/14 5.  Return in 6 weeks/

## 2014-06-07 NOTE — Progress Notes (Signed)
Patient ID: Johnny Oneill, male   DOB: 12-16-1940, 73 y.o.   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   Admitted 3/4-3/25/15 with SOB and syncopal episode. Found to have SBO and NG placed and started on TPN. On 11/06/13 developed progressive SOB and hypotension and co-ox 51% and Hgb 6.7. Started on milrinone and transfused. Echo showed nl RV function. Patient had PEA arrest and was intubated and started on pressors. CXR revelaed a R hemothorax and a R CT placed. He required VATs  and evacuation of hemothorax and then tansferred to CIR.   Patient saw Dr. Rayann Heman 01/24/14 for device follow up. Per note: pt is stable and on an appropriate medical regimen. Normal ICD function. R waves have decreased since LVAD placement; CXR revealed stable lead position. He plans to follow clinically and try to avoid lead revision if possible.  Patient had [place] in [id number] complicated by suspected right hemothorax (used RIJ access initially, RHC completed via right femoral - see numbers below).  Small pleural effusion only, unable to drain via chest tube.  [place] 6/15 With VAD 9200  RA = 4  RV = 33/2/6  PA = 33/14 (23)  PCW = 8  Fick cardiac output/index = 6.5/3.7  PVR = 2.3 WU  FA sat = 94%  PA sat = 64%, 69%  Follow up for Heart Failure: Last visit started lasix 40 mg weekly. Went to [place] for vacation and had a great time. SOB with going up and down hills. Denies orthopnea, PND or CP. After walking about 150 yards gets minimal SOB. Hydralazine has been increased to 75 mg TID. Went to [facility] yesterday and decided to be taken off heart tx list. Weight at home 154-156 lbs. Trying to follow a low salt diet and drink less than 2L a day.   Reports taking Coumadin as prescribed and adherence to anticoagulation based dietary restrictions.  Denies bright red blood per rectum or melena, no  dark urine or hematuria.     Past Medical History  Diagnosis Date  . Ischemic cardiomyopathy     a. 10/09 Echo: Sev LV Dysfxn, inf/lat AK, Mod MR.  [name];  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 Hypertension   . PVC (premature ventricular contraction)   . Pacemaker   . CHF (congestive heart failure)   . Shortness of breath   . Automatic implantable cardioverter-defibrillator in situ     Current Outpatient Prescriptions  Medication Sig Dispense Refill  . aspirin EC 325 MG tablet Take 325 mg by mouth daily.      . citalopram (CELEXA) 10 MG tablet Take 1 tablet (10 mg total) by mouth daily.  90 tablet  2  . Coenzyme Q10 (COQ10) 200 MG CAPS Take 200 mg by mouth daily.      [name] ezetimibe (ZETIA) 10 MG tablet Take 1 tablet (10 mg total)  by mouth daily.  90 tablet  3  . ferrous Q000111Q C-folic acid (TRINSICON / FOLTRIN) capsule Take 1 capsule by mouth daily.  30 capsule  6  . furosemide (LASIX) 20 MG tablet Take 40 mg by mouth once a week.       . hydrALAZINE (APRESOLINE) 25 MG tablet Take 75 mg by mouth 3 (three) times daily.      [name] levothyroxine (SYNTHROID) 175 MCG tablet Take 1 tablet (175 mcg total) by mouth daily before breakfast.  90 tablet  1  . pantoprazole (PROTONIX) 40 MG tablet Take 1 tablet (40 mg total) by mouth daily.  30 tablet  1  . potassium chloride SA (K-DUR,KLOR-CON) 20 MEQ tablet Take 20 mEq by mouth once a week. Take on day of Lasix dose      . rosuvastatin (CRESTOR) 20 MG tablet Take 1 tablet (20 mg total) by mouth daily.  90 tablet  3  . temazepam (RESTORIL) 15 MG capsule Take 1 capsule (15 mg total) by mouth at  bedtime.  30 capsule  5  . warfarin (COUMADIN) 5 MG tablet Take 1 tablet (5 mg total) by mouth daily at 6 PM. Take one tab daily or as directed for INR 2 - 3  60 tablet  6   No current facility-administered medications for this encounter.   Facility-Administered Medications Ordered in Other Encounters  Medication Dose Route Frequency Provider Last Rate Last Dose  . etomidate (AMIDATE) injection    Anesthesia Intra-op Sammuel Cooper Mumm, CRNA   20 mg at 02/08/14 0915  . rocuronium [facility]) injection    Anesthesia Intra-op Sammuel Cooper Mumm, CRNA   50 mg at 02/08/14 0932  . succinylcholine (ANECTINE) injection    Anesthesia Intra-op Sammuel Cooper Mumm, CRNA   100 mg at 02/08/14 0915    Ace inhibitors; Diovan; Lipitor; and Norvasc  REVIEW OF SYSTEMS: All systems negative except as listed in HPI, PMH and Problem list.  LVAD interrogation reveals:  Speed:  9200 Flow:  3.3 Power: 4.7 PI: 6.4 Alarms:  none Events: None;  Fixed speed:  9200  RPM Low speed limit:  8600 RPM Primary Controller: Replace back up battery in 24 Months (11/2015) Back up controller: Replace back up battery in 24 Months (11/2015)  I reviewed the LVAD parameters from today, and compared the results to the patient's prior recorded data.  No programming changes were made.  The LVAD is functioning within specified parameters.  The patient performs LVAD self-test daily.  LVAD interrogation was negative for any significant power changes, alarms or PI events/speed drops.  LVAD equipment check completed and is in good working order.  Back-up equipment present.   LVAD education done on emergency procedures and precautions and reviewed exit site care.    Filed Vitals:   06/07/14 1131  BP: 114/0  Pulse: 89  Height: 5\' 9"  (1.753 m)  Weight: 161 lb 9.6 oz (73.301 kg)  SpO2: 99%    Physical Exam: GENERAL: Well appearing, male who presents to clinic today in no acute distress; Wife present HEENT: normal  NECK: Supple, JVP 8, no  bruits.  No lymphadenopathy or thyromegaly appreciated.    CARDIAC:  Mechanical heart sounds with LVAD hum present.  LUNGS:  Clear to auscultation bilaterally.  ABDOMEN:  Soft, round, nontender, positive bowel sounds x4.     LVAD exit site: well-healed and semi incorporated.  Dressing dry and intact.  No erythema or drainage.  Stabilization device present and accurately applied.  Driveline  dressing is being changed weekly per sterile technique. EXTREMITIES:  Warm and dry, no cyanosis, clubbing, rash. Trace lower extremity edema  NEUROLOGIC:  Alert and oriented x 4.  Gait steady.  No aphasia.  No dysarthria.  Affect pleasant.     ASSESSMENT AND PLAN:   1) Chronic systolic HF: ICM, EF 0000000 s/p LVAD HM II implant 08/2013.  - NYHA II symptoms and volume status slightly elevated. Weight continues to trend up and on Optivol thoracic impendence down. He has not been taking lasix once a week consistently. Have asked him to start taking lasix 40 mg weekly with an extra dose this week.  - Not on BB with history of RV failure.  - MAP remains elevated, however very similar to SBP on cuff. Checked BP with cuff and SBP 119/99 (103) so am wondering if MAP more like SBP. Wife has doppler at home and is checking. Will start Spiro 12.5 mg at night QOD and if he tolerates increase to daily. Check BMET in 7-14 days. Stop potassium. - Continue hydralazine 75 mg TID.  - He has not tolerated ACE-I or norvasc in the past. - Patient went to [facility] yesterday and they have decided to be taken off the tx list which I do not think is unreasonable. They both seem more at peace.  2) LVAD:  - Doing well. Traveled to [place] since last visit and had no issues. - Check labs 3) Chronic anticoagulation: INR goal 2.0-3.0. No bleeding issues. Check INR and adjust accordingly. Continue ASA 325 mg for LVAD. 4) HTN: MAP remains elevated, refer to above.  5) Hypothyroidism: Per Dr Dwyane Dee.    F/U 6 weeks Junie Bame  [facility] 06/07/2014

## 2014-06-18 ENCOUNTER — Other Ambulatory Visit (HOSPITAL_COMMUNITY): Payer: Self-pay | Admitting: *Deleted

## 2014-06-18 ENCOUNTER — Ambulatory Visit (HOSPITAL_COMMUNITY): Payer: Self-pay | Admitting: *Deleted

## 2014-06-18 ENCOUNTER — Other Ambulatory Visit (INDEPENDENT_AMBULATORY_CARE_PROVIDER_SITE_OTHER): Payer: Medicare Other | Admitting: *Deleted

## 2014-06-18 DIAGNOSIS — Z7901 Long term (current) use of anticoagulants: Secondary | ICD-10-CM

## 2014-06-18 DIAGNOSIS — Z95811 Presence of heart assist device: Secondary | ICD-10-CM

## 2014-06-18 DIAGNOSIS — Z79899 Other long term (current) drug therapy: Secondary | ICD-10-CM

## 2014-06-18 LAB — PROTIME-INR
INR: 3.5 ratio — ABNORMAL HIGH (ref 0.8–1.0)
Prothrombin Time: 37.8 s — ABNORMAL HIGH (ref 9.6–13.1)

## 2014-06-19 LAB — BASIC METABOLIC PANEL
BUN: 24 mg/dL — ABNORMAL HIGH (ref 6–23)
CO2: 28 mEq/L (ref 19–32)
Calcium: 9.1 mg/dL (ref 8.4–10.5)
Chloride: 104 mEq/L (ref 96–112)
Creatinine, Ser: 1.4 mg/dL (ref 0.4–1.5)
GFR: 55.4 mL/min — ABNORMAL LOW (ref >60.00)
Glucose, Bld: 96 mg/dL (ref 70–99)
Potassium: 4.3 mEq/L (ref 3.5–5.1)
Sodium: 138 mEq/L (ref 135–145)

## 2014-06-19 NOTE — Progress Notes (Signed)
This encounter was created in error - please disregard.

## 2014-06-29 IMAGING — CT CT ABD-PELV W/ CM
2 of 5 series · 16 of 46 positions shown, 18 images · IV contrast (APPLIED)
Comparison: 11/01/2013 plain film exam.  09/13/2013 CT.

CLINICAL DATA: Vomiting.  Lethargic.

EXAM:
CT ABDOMEN AND PELVIS WITH CONTRAST
TECHNIQUE: Multidetector CT imaging of the abdomen and pelvis was performed
using the standard protocol following bolus administration of
intravenous contrast.
CONTRAST:  100mL OMNIPAQUE IOHEXOL 300 MG/ML  SOLN

[Series 2: abd/ pelvis 5.0 i30f 1 · axial · 0.77mm/px · z∈[-438,-3]mm · 13 of 99 slices shown, 15 images]
[im 6/99  soft-tissue]
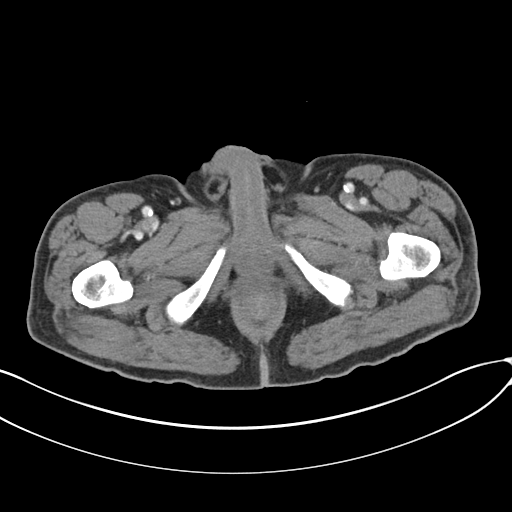
[im 6/99  bone]
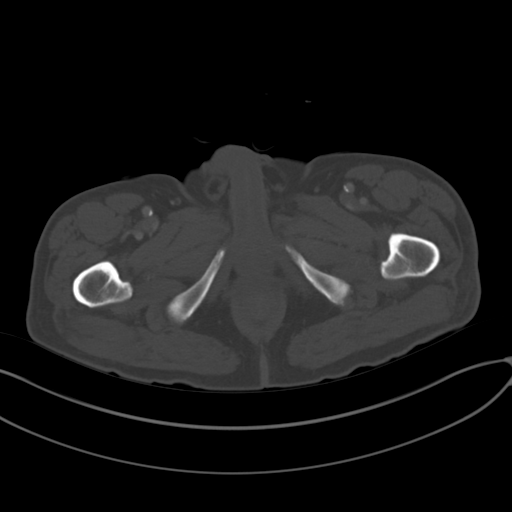
[im 11/99  soft-tissue]
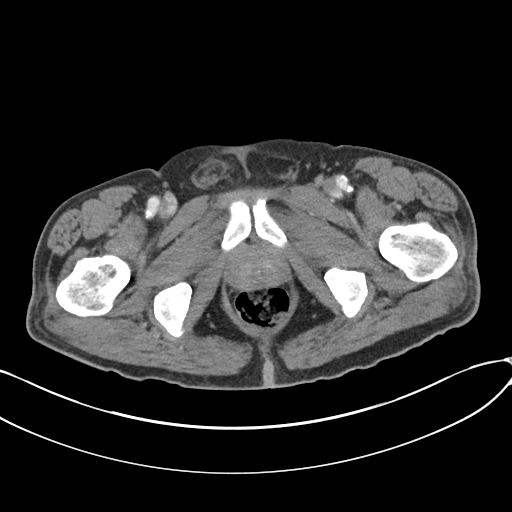
[im 22/99  soft-tissue]
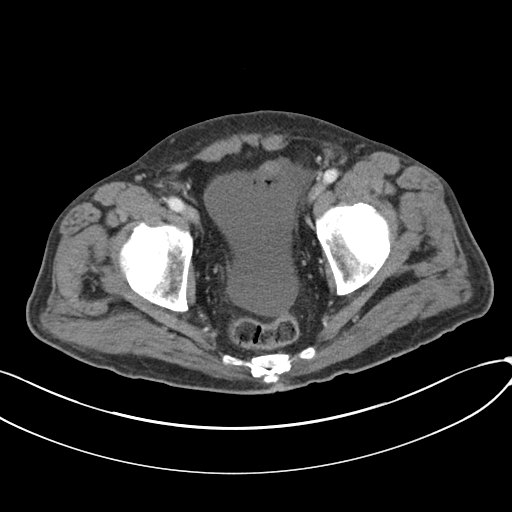
[im 28/99  soft-tissue]
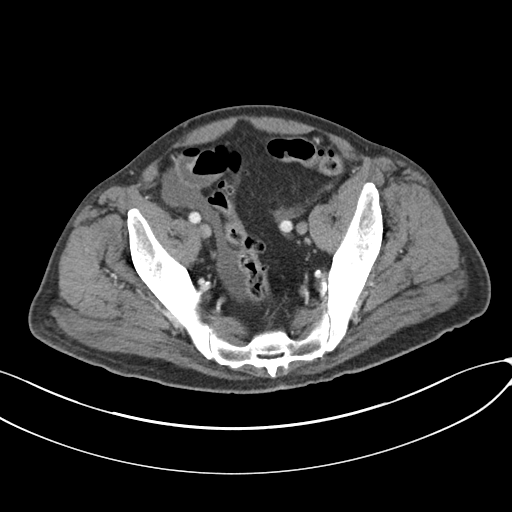
[im 33/99  soft-tissue]
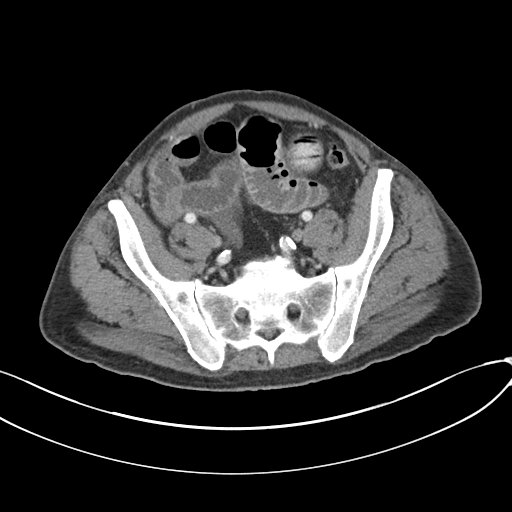
[im 44/99  soft-tissue]
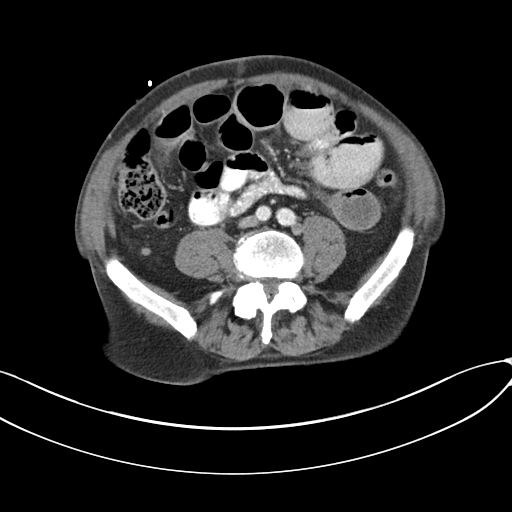
[im 50/99  soft-tissue]
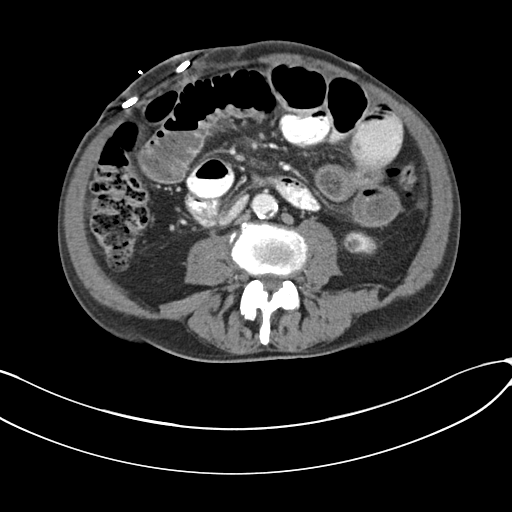
[im 55/99  soft-tissue]
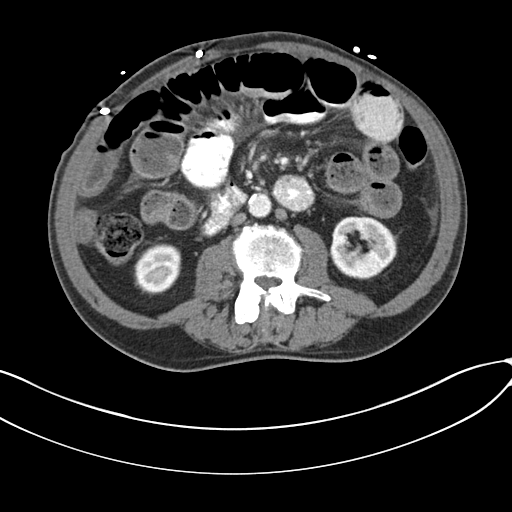
[im 66/99  soft-tissue]
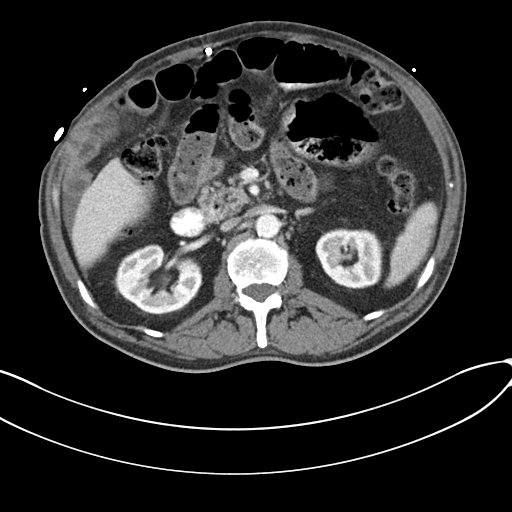
[im 66/99  bone]
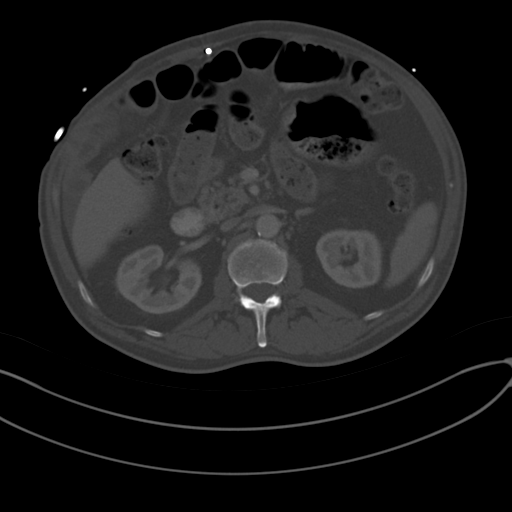
[im 71/99  soft-tissue]
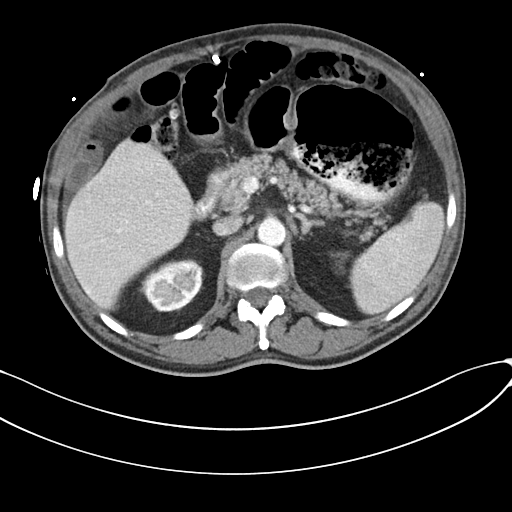
[im 77/99  soft-tissue]
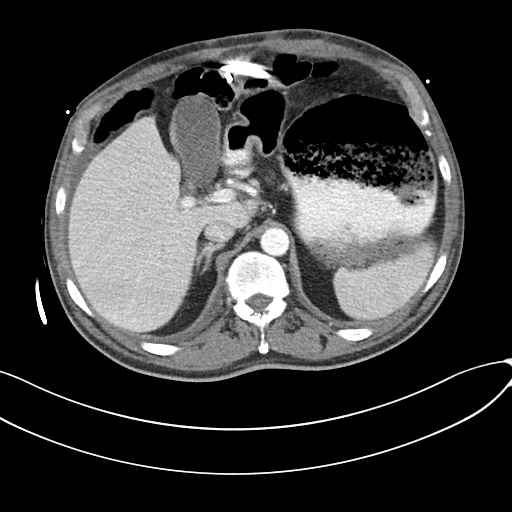
[im 88/99  soft-tissue]
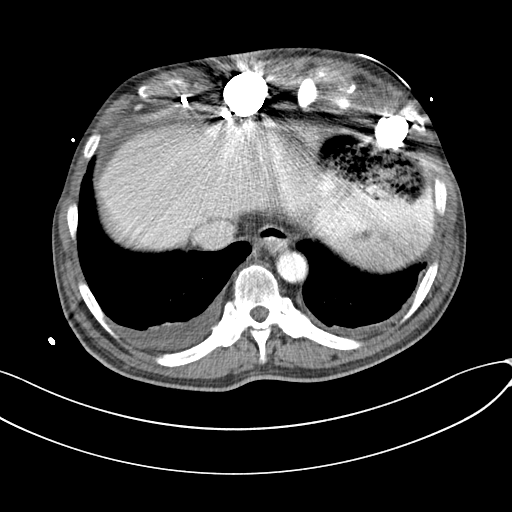
[im 93/99  soft-tissue]
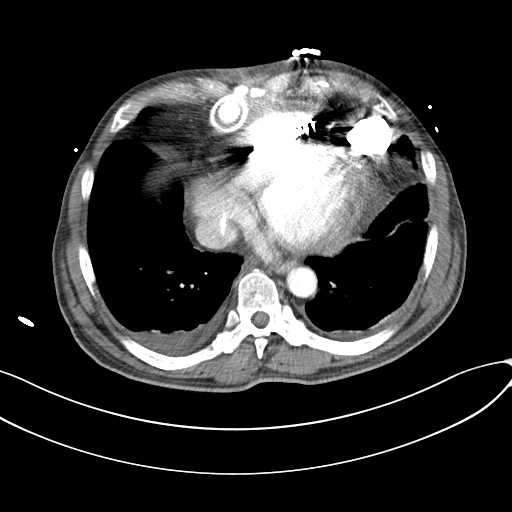

[Series 6: coronals · coronal · 0.70mm/px · 3 of 127 slices shown]
[im 43/127  soft-tissue]
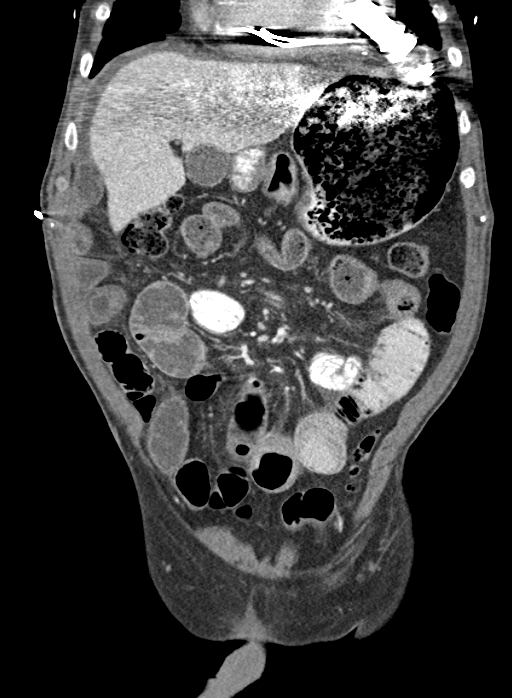
[im 57/127  soft-tissue]
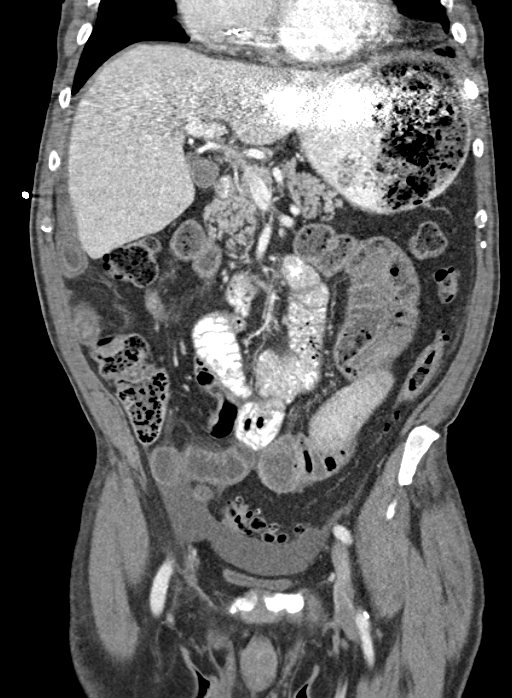
[im 71/127  soft-tissue]
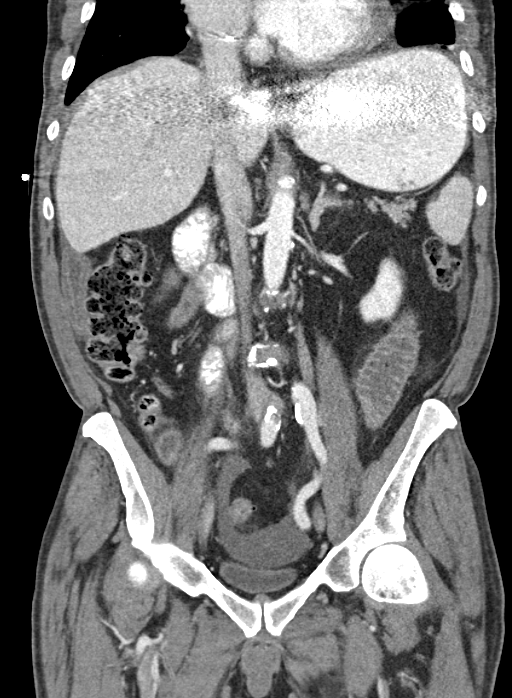

[16 of 46 positions shown; findings below may reference images not displayed]

FINDINGS: Left ventricular assist device is in place causes streak artifact.
Cardiomegaly. Pleural effusions. Basilar atelectasis.

Dilated fluid and gas-filled stomach. Contrast extends into
nondilated duodenum. Beyond this region, fluid and gas-filled
dilated loops of small bowel consistent with small bowel
obstruction. Cause indeterminate. This may be related to adhesions.
Fluid within the pelvis most likely is related to the small bowel
obstruction. No free intraperitoneal air. No inflammation surrounds
the appendix or sigmoid diverticula.

Taking into account limitation by streak artifact from left
ventricular assist device, no worrisome hepatic, splenic,
pancreatic, adrenal or renal lesion is noted. There are sub cm
low-density structures within the inferior aspect the left kidney
which may be a cyst although too small to characterize.

Atherosclerotic type changes of the abdominal aorta most notable at
the aortic bifurcation without high-grade stenosis. Mild ectasia
proximal left common iliac artery.

Decompressed urinary bladder. Slightly heterogeneous appearance of
the prostate gland. Clinical and laboratory correlation recommended.

Degenerative changes lumbar spine without bony destructive lesion.
IMPRESSION: Small bowel obstructive pattern as detailed above.

These results were called by telephone at the time of interpretation
on 11/02/2013 at [DATE] to Ketiti patients nurse, who verbally
acknowledged these results.

## 2014-06-29 IMAGING — CR DG CHEST 2V
2 series · 2 of 2 positions shown · non-contrast
Comparison: 09/28/2013

CLINICAL DATA: Fall.  LVAD

EXAM:
CHEST  2 VIEW

[w chest pa]
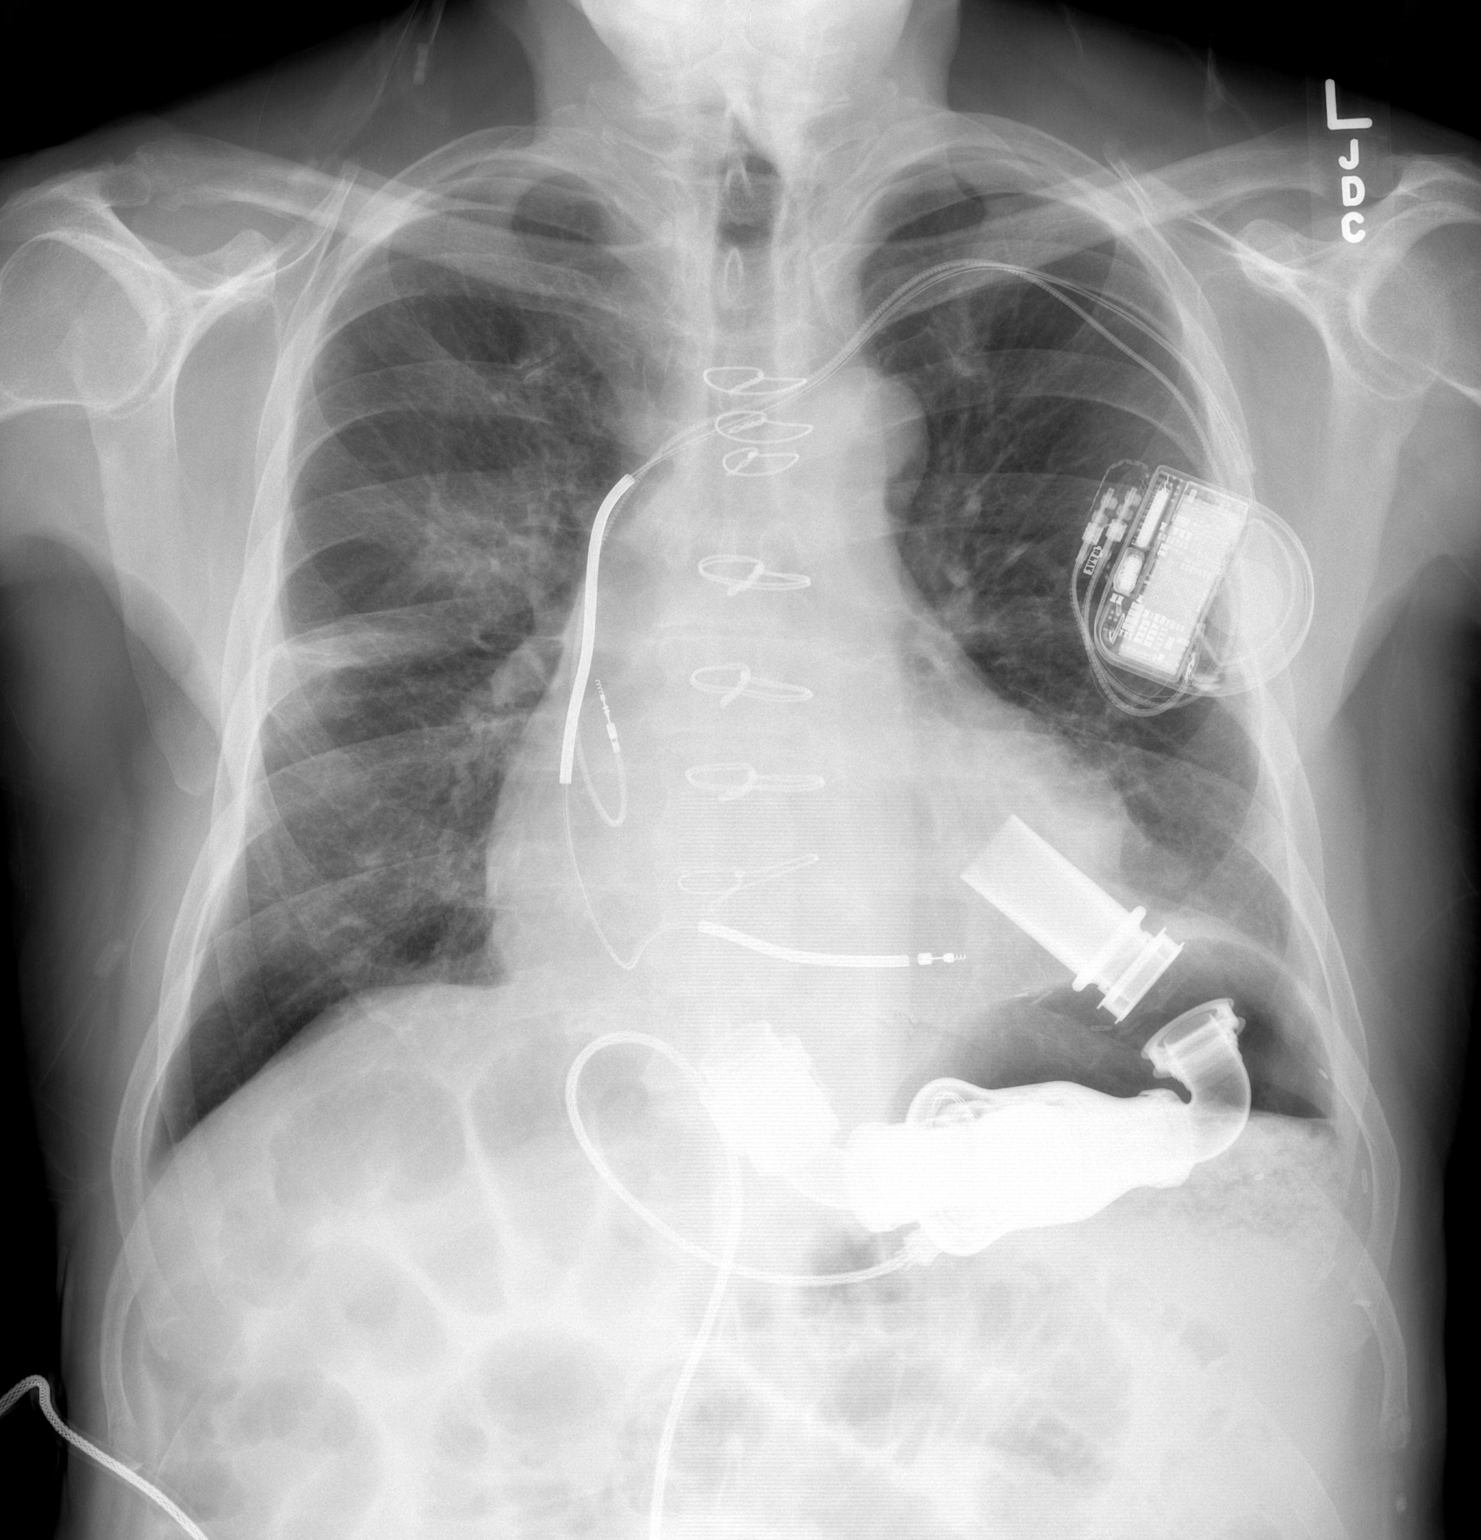

[w chest lat]
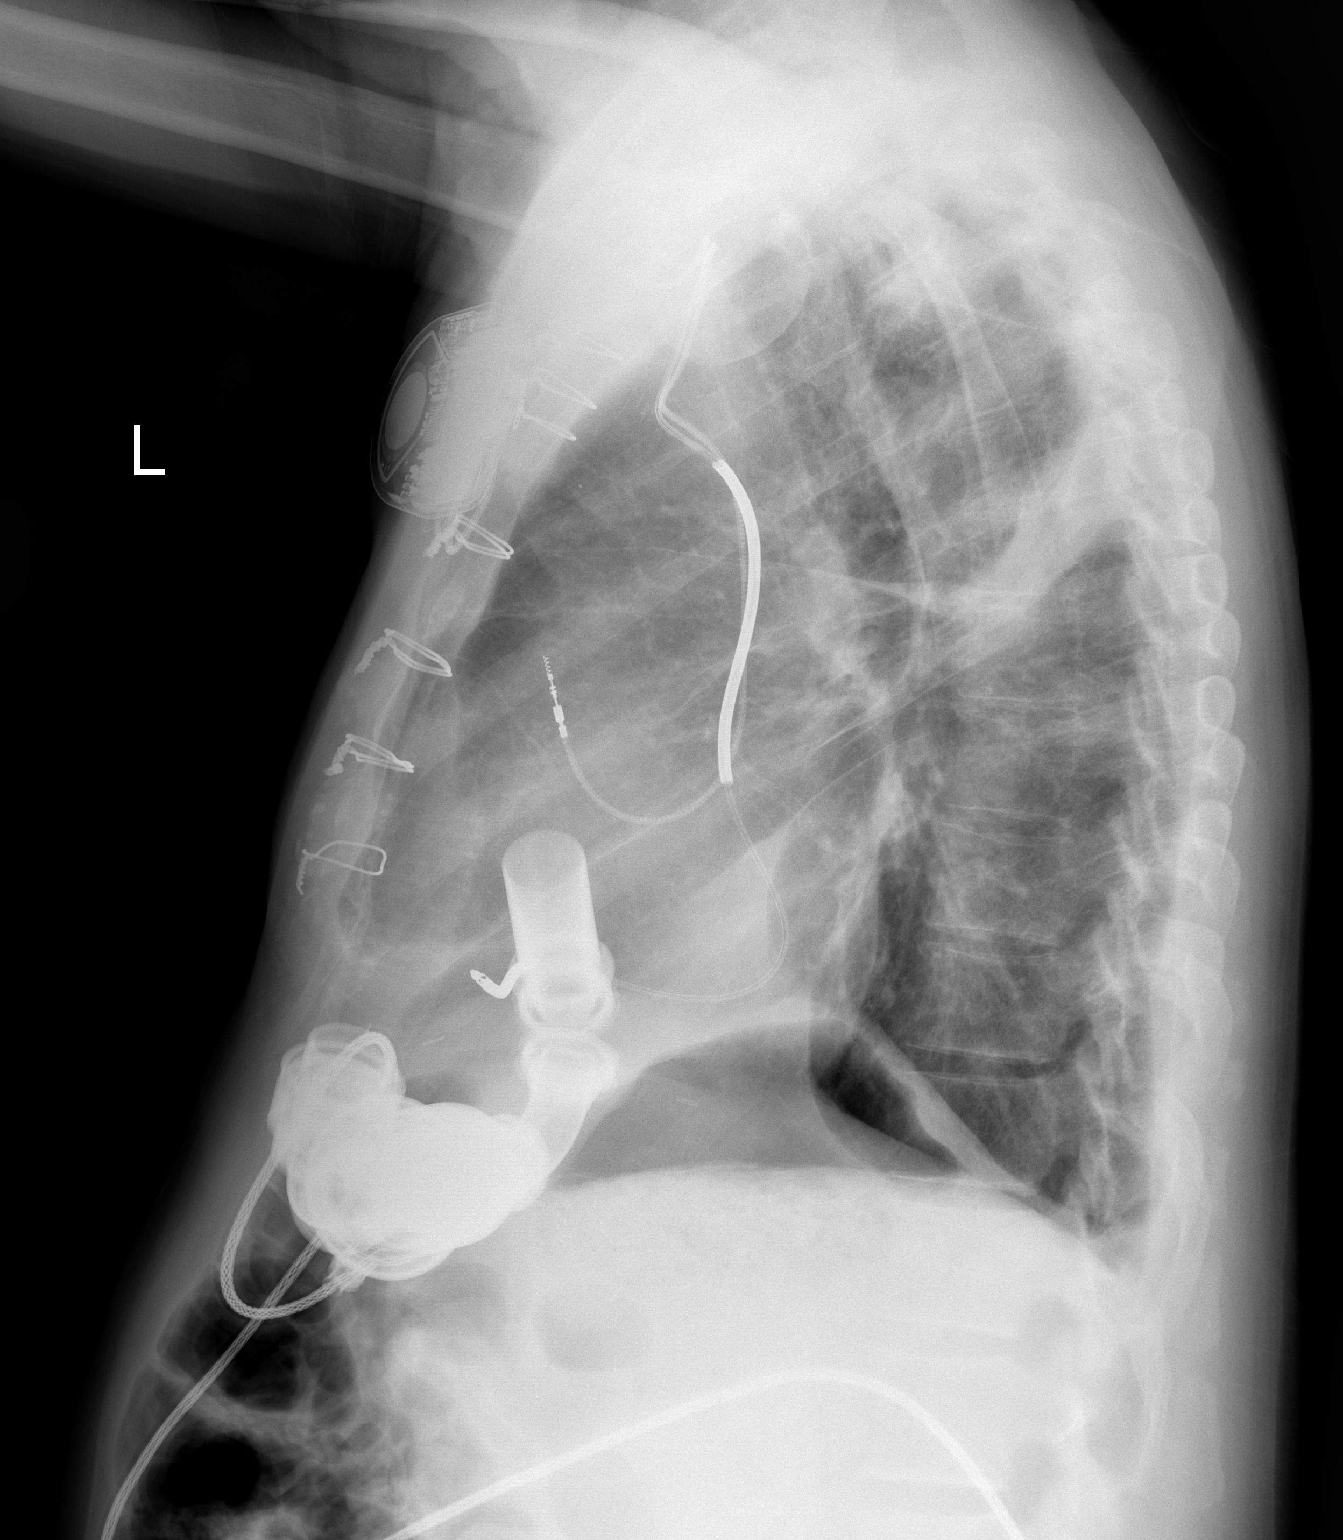

[2 of 2 positions shown; findings below may reference images not displayed]

FINDINGS: There is a left chest wall ICD with lead in the right ventricle and
right atrial appendage. The left ventricular assist device is
identified and appears unchanged in position from previous exam.
Stable appearance of left upper lobe perihilar opacity and pleural
thickening of the major fissure. No displaced rib fractures noted.
IMPRESSION: 1. No acute findings and no significant change from 10/02/2013

## 2014-07-09 ENCOUNTER — Other Ambulatory Visit (INDEPENDENT_AMBULATORY_CARE_PROVIDER_SITE_OTHER): Payer: Medicare Other | Admitting: *Deleted

## 2014-07-09 ENCOUNTER — Ambulatory Visit (HOSPITAL_COMMUNITY): Payer: Self-pay | Admitting: *Deleted

## 2014-07-09 DIAGNOSIS — Z7901 Long term (current) use of anticoagulants: Secondary | ICD-10-CM

## 2014-07-09 DIAGNOSIS — Z95811 Presence of heart assist device: Secondary | ICD-10-CM

## 2014-07-09 LAB — PROTIME-INR
INR: 2.3 ratio — ABNORMAL HIGH (ref 0.8–1.0)
Prothrombin Time: 25.3 s — ABNORMAL HIGH (ref 9.6–13.1)

## 2014-07-10 ENCOUNTER — Other Ambulatory Visit (HOSPITAL_COMMUNITY): Payer: Self-pay | Admitting: *Deleted

## 2014-07-10 DIAGNOSIS — Z7901 Long term (current) use of anticoagulants: Secondary | ICD-10-CM

## 2014-07-10 DIAGNOSIS — Z95811 Presence of heart assist device: Secondary | ICD-10-CM

## 2014-07-11 ENCOUNTER — Encounter: Payer: Self-pay | Admitting: Licensed Clinical Social Worker

## 2014-07-11 ENCOUNTER — Encounter (HOSPITAL_COMMUNITY): Payer: Self-pay

## 2014-07-11 ENCOUNTER — Ambulatory Visit (HOSPITAL_COMMUNITY): Payer: Self-pay | Admitting: *Deleted

## 2014-07-11 ENCOUNTER — Encounter (HOSPITAL_COMMUNITY): Payer: Self-pay | Admitting: *Deleted

## 2014-07-11 ENCOUNTER — Ambulatory Visit (HOSPITAL_COMMUNITY)
Admission: RE | Admit: 2014-07-11 | Discharge: 2014-07-11 | Disposition: A | Discharge status: Home / Self Care | Payer: Medicare Other | Source: Ambulatory Visit | Attending: Internal Medicine | Admitting: Internal Medicine

## 2014-07-11 VITALS — BP 116/0 | HR 86 | Ht 69.0 in | Wt 159.8 lb

## 2014-07-11 DIAGNOSIS — I10 Essential (primary) hypertension: Secondary | ICD-10-CM | POA: Insufficient documentation

## 2014-07-11 DIAGNOSIS — I255 Ischemic cardiomyopathy: Secondary | ICD-10-CM

## 2014-07-11 DIAGNOSIS — E039 Hypothyroidism, unspecified: Secondary | ICD-10-CM | POA: Diagnosis not present

## 2014-07-11 DIAGNOSIS — Z7901 Long term (current) use of anticoagulants: Secondary | ICD-10-CM | POA: Insufficient documentation

## 2014-07-11 DIAGNOSIS — Z79899 Other long term (current) drug therapy: Secondary | ICD-10-CM

## 2014-07-11 DIAGNOSIS — Z95811 Presence of heart assist device: Secondary | ICD-10-CM

## 2014-07-11 DIAGNOSIS — I5022 Chronic systolic (congestive) heart failure: Secondary | ICD-10-CM | POA: Diagnosis present

## 2014-07-11 LAB — CBC
HCT: 38.2 % — ABNORMAL LOW (ref 39.0–52.0)
Hemoglobin: 12.2 g/dL — ABNORMAL LOW (ref 13.0–17.0)
MCH: 29.2 pg (ref 26.0–34.0)
MCHC: 31.9 g/dL (ref 30.0–36.0)
MCV: 91.4 fL (ref 78.0–100.0)
Platelets: 125 10*3/uL — ABNORMAL LOW (ref 150–400)
RBC: 4.18 MIL/uL — ABNORMAL LOW (ref 4.22–5.81)
RDW: 18.6 % — ABNORMAL HIGH (ref 11.5–15.5)
WBC: 4.5 10*3/uL (ref 4.0–10.5)

## 2014-07-11 LAB — BASIC METABOLIC PANEL
Anion gap: 13 (ref 5–15)
BUN: 21 mg/dL (ref 6–23)
CO2: 25 mEq/L (ref 19–32)
Calcium: 9.3 mg/dL (ref 8.4–10.5)
Chloride: 101 mEq/L (ref 96–112)
Creatinine, Ser: 1.4 mg/dL — ABNORMAL HIGH (ref 0.50–1.35)
GFR calc Af Amer: 57 mL/min — ABNORMAL LOW (ref >90)
GFR calc non Af Amer: 49 mL/min — ABNORMAL LOW (ref >90)
Glucose, Bld: 129 mg/dL — ABNORMAL HIGH (ref 70–99)
Potassium: 4.4 mEq/L (ref 3.7–5.3)
Sodium: 139 mEq/L (ref 137–147)

## 2014-07-11 LAB — LACTATE DEHYDROGENASE: LDH: 288 U/L — ABNORMAL HIGH (ref 94–250)

## 2014-07-11 LAB — PRO B NATRIURETIC PEPTIDE: Pro B Natriuretic peptide (BNP): 4464 pg/mL — ABNORMAL HIGH (ref 0–125)

## 2014-07-11 MED ORDER — SPIRONOLACTONE 25 MG PO TABS: 25.0000 mg | ORAL_TABLET | Freq: Every day | ORAL | Status: DC

## 2014-07-11 NOTE — Patient Instructions (Addendum)
1.  Stop weekly Lasix. 2.  Increase Spironolactone 25 mg daily (take in the am); will check kidney function one week 07/18/14. 3.  Check INR one week on 07/18/14. 4.  Use saline only to cleanse driveline exit site until rash resolves. Then may go back to Chloraprep to cleanse and see if skin remains clear. 5.  Return to [place] clinic 2 months.

## 2014-07-11 NOTE — Progress Notes (Signed)
CSW met with patient and wife in the VAD clinic. Patient is well groomed and in good spirits. Patient spoke at length at support group and recent activities he has been involved with. Patient stated "we made a deal with the Lord that if I lived through this that we would give back in service and that;s what we are doing". Patient and wife deny any concerns and both plan to attend support group next week. CSW will continue to be available as needed. Jackie , LCSW [phone] 

## 2014-07-11 NOTE — Progress Notes (Signed)
Patient ID: Johnny Oneill, male   DOB: 05/31/1941, 73 y.o.   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   Admitted 3/4-3/25/15 with SOB and syncopal episode. Found to have SBO and NG placed and started on TPN. On 11/06/13 developed progressive SOB and hypotension and co-ox 51% and Hgb 6.7. Started on milrinone and transfused. Echo showed nl RV function. Patient had PEA arrest and was intubated and started on pressors. CXR revelaed a R hemothorax and a R CT placed. He required VATs  and evacuation of hemothorax and then tansferred to CIR.   Patient saw Dr. Rayann Heman 01/24/14 for device follow up. Per note: pt is stable and on an appropriate medical regimen. Normal ICD function. R waves have decreased since LVAD placement; CXR revealed stable lead position. He plans to follow clinically and try to avoid lead revision if possible.  Patient had [place] in [id number] complicated by suspected right hemothorax (used RIJ access initially, RHC completed via right femoral - see numbers below).  Small pleural effusion only, unable to drain via chest tube.  [place] 6/15 With VAD 9200  RA = 4  RV = 33/2/6  PA = 33/14 (23)  PCW = 8  Fick cardiac output/index = 6.5/3.7  PVR = 2.3 WU  FA sat = 94%  PA sat = 64%, 69%  Follow up for Heart Failure: Last visit stopped potassium and started Spironolactone 12.5 mg at night QOD. He does not take his Arlyce Harman on the days he takes lasix. Days he takes lasix loses about 2-3 lbs. Denies SOB, orthopnea, PND of CP. +SOB with walking up inclines. Weight 152-155 lbs. Trying to drink at least 2L a day.   Reports taking Coumadin as prescribed and adherence to anticoagulation based dietary restrictions.  Denies bright red blood per rectum or melena, no dark urine or hematuria.     Past Medical History  Diagnosis Date  . Ischemic cardiomyopathy     a.  10/09 Echo: Sev LV Dysfxn, inf/lat AK, Mod MR.  [name];  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 Hypertension   . PVC (premature ventricular contraction)   . Pacemaker   . CHF (congestive heart failure)   . Shortness of breath   . Automatic implantable cardioverter-defibrillator in situ     Current Outpatient Prescriptions  Medication Sig Dispense Refill  . aspirin EC 325 MG tablet Take 325 mg by mouth daily.    . citalopram (CELEXA) 10 MG tablet Take 1 tablet (10 mg total) by mouth daily. 90 tablet 2  . Coenzyme Q10 (COQ10) 200 MG CAPS Take 200 mg by mouth daily.    [name] ezetimibe (ZETIA) 10 MG tablet Take 1 tablet (10 mg total) by mouth daily. 90 tablet 3  . ferrous Q000111Q C-folic acid (TRINSICON / FOLTRIN) capsule Take 1 capsule by mouth daily. 30 capsule 6  . furosemide (LASIX)  20 MG tablet Take 40 mg by mouth once a week.     . hydrALAZINE (APRESOLINE) 25 MG tablet Take 75 mg by mouth 3 (three) times daily.    [name] levothyroxine (SYNTHROID) 175 MCG tablet Take 1 tablet (175 mcg total) by mouth daily before breakfast. 90 tablet 1  . pantoprazole (PROTONIX) 40 MG tablet Take 1 tablet (40 mg total) by mouth daily. 30 tablet 1  . rosuvastatin (CRESTOR) 20 MG tablet Take 1 tablet (20 mg total) by mouth daily. 90 tablet 3  . spironolactone (ALDACTONE) 25 MG tablet Take 1/2 tab every other day 90 tablet 3  . temazepam (RESTORIL) 15 MG capsule Take 1 capsule (15 mg total) by mouth at bedtime. 30 capsule 5  . warfarin (COUMADIN) 5 MG tablet Take 1 tablet (5 mg total) by mouth daily at 6 PM. Take one tab daily or as directed for INR 2 - 3 60  tablet 6  . potassium chloride SA (K-DUR,KLOR-CON) 20 MEQ tablet Take 20 mEq by mouth once a week. Take on day of Lasix dose     No current facility-administered medications for this encounter.   Facility-Administered Medications Ordered in Other Encounters  Medication Dose Route Frequency Provider Last Rate Last Dose  . etomidate (AMIDATE) injection    Anesthesia Intra-op Sammuel Cooper Mumm, CRNA   20 mg at 02/08/14 0915  . rocuronium [facility]) injection    Anesthesia Intra-op Sammuel Cooper Mumm, CRNA   50 mg at 02/08/14 0932  . succinylcholine (ANECTINE) injection    Anesthesia Intra-op Sammuel Cooper Mumm, CRNA   100 mg at 02/08/14 0915    Ace inhibitors; Diovan; Lipitor; and Norvasc  REVIEW OF SYSTEMS: All systems negative except as listed in HPI, PMH and Problem list.  LVAD interrogation reveals:  Speed:  9200 Flow:  3.6 Power: 4.8 PI: 7.0 Alarms:  none Events: Few PI events and 3 low flow alarms on 06/21/14 at 2 pm (pt denies any VAD alarms) Fixed speed:  9200  RPM Low speed limit:  8600 RPM Primary Controller: Replace back up battery in 22 Months (10/2015) Back up controller: Replace back up battery in 22 Months (10/2015)  I reviewed the LVAD parameters from today, and compared the results to the patient's prior recorded data.  No programming changes were made.  The LVAD is functioning within specified parameters.  The patient performs LVAD self-test daily.  LVAD interrogation was negative for any significant power changes, alarms or PI events/speed drops.  LVAD equipment check completed and is in good working order.  Back-up equipment present.   LVAD education done on emergency procedures and precautions and reviewed exit site care.    Filed Vitals:   07/11/14 1038  BP: 116/0  Pulse: 86  Height: 5\' 9"  (1.753 m)  Weight: 159 lb 12.8 oz (72.485 kg)  SpO2: 100%    Physical Exam: GENERAL: Well appearing, male who presents to clinic today in no acute distress; Wife present HEENT: normal   NECK: Supple, JVP 8, no bruits.  No lymphadenopathy or thyromegaly appreciated.    CARDIAC:  Mechanical heart sounds with LVAD hum present.  LUNGS:  Clear to auscultation bilaterally.  ABDOMEN:  Soft, round, nontender, positive bowel sounds x4. Chest jumping like diaphragmatic pacing.   LVAD exit site: well-healed and semi incorporated.  Dressing dry and intact.  No erythema or drainage.  Stabilization device present and accurately applied.  Driveline dressing is being changed weekly per sterile technique. EXTREMITIES:  Warm and dry, no  cyanosis, clubbing, rash. Trace lower extremity edema  NEUROLOGIC:  Alert and oriented x 4.  Gait steady.  No aphasia.  No dysarthria.  Affect pleasant.     ASSESSMENT AND PLAN:   1) Chronic systolic HF: ICM, EF 0000000 s/p LVAD HM II implant 08/2013.  - NYHA II symptoms and volume status slightly elevated. He has been taking lasix QOD will change to PRN and increase Spironolactone to 25 mg daily.  - Stop potassium.  - Not on BB with history of RV failure.  - MAP remains elevated. As above increase spironolactone to 25 mg daily. Check BMET in 7-10 days. Continue hydralazine 75 mg TID.  - He has not tolerated ACE-I or norvasc in the past. 2) LVAD:  - Doing well. LVAD parameters stable. Had 3 low flow alarms on 10/22, however patient did not hear them so they were probably very brief. Continue to follow. - Check BMET, CBC, pro-BNP, LDH and INR. - Last RAMP Echo 12/2013 will plan to repeat 12/2014 3) Chronic anticoagulation: -  INR goal 2.0-3.0. No bleeding issues. Check INR and adjust accordingly. Continue ASA 325 mg for LVAD. 4) HTN:  - As above remains elevated. Will increase Spironolactone to 25 mg daily.   5) Hypothyroidism: Per Dr Dwyane Dee.  6) ? Diaphragmatic pacing? - Can see chest wall moving and patient sometimes feels his chest feeling like it is bouncing. ICD interrogated and everything looks good.   F/U 2 months Junie Bame  [facility] 07/11/2014

## 2014-07-11 NOTE — Progress Notes (Addendum)
Symptom Yes No Details  Angina          x Activity:  Claudication          x How far:  Syncope          x When:  Stroke          x   Orthopnea          x How many pillows:  one  PND          x How often:  CPAP        N/A How many hrs:  Pedal edema          x           right foot; clears after lasix  Abd fullness          x           clears after lasix  N&V          x   Diaphoresis          x When:  Bleeding          x   Urine   Medium yellow  SOB                    x Activity:   Palpitations          x When:  ICD shock          x   Hospitlizaitons          x When/where/why:  ED visit          x When/where/why:  Other MD                  x When/who/why:    Activity   Working at [employer]; no limitations  Fluid   < 2000   Diet   Low salt    Pulse:  86 MAP:  116 SPO2:  100 Weight: 159.8 lbs Last weight:   161.6 lbs  VAD interrogation revealed: Speed:  9200 Flow:   4.0 Power:  4.9 PI: 7.0 Alarms:  3 brief low flow 06/21/14 at 2 pm (pt denies any VAD alarms) Events:   none Fixed speed:  9200 Low speed limit:  8600 Primary Controller:  Replace back up battery in 22 months (Feb/2017) Back up controller:   Replace back up battery in 22 months (Feb/ 2017)  I reviewed the LVAD parameters from today, and compared the results to the patient's prior recorded data.  No programming changes were made. The LVAD is functioning within specified parameters.  LVAD interrogation was negative for any significant power changes or PI events/speed drops.  There were three brief low flow alarms on 06/21/14 at 2 pm, pt denies hearing any VAD alarms. LVAD equipment check completed and is in good working order.  Back-up equipment present.  LVAD education done on emergency procedures and precautions and reviewed exit site care.   Drive line exit site well healed and incorporated. The velour is fully implanted at exit site. Dressing dry and intact. Wife reports rash under Sorbaview dressing that started  yesterday; pt c/o itching at site. Raised red rash noted around exit site, no tenderness, drainage, or foul odor reported. Possible reaction to Chloraprep applicators; asked wife to cleanse with saline wipes until rash resolves, then resume using Chloraprep applicators to see if rash returns. Reminded to let area dry completely after using applicators before applying dressing.  Stabilization device present and accurately  applied. Driveline dressing is being changed weekly per sterile technique using Sorbaview dressing with biopatch on exit site. Pt denies fever or chills. Pt has adequate dressing supplies at home.  Asked pt/wife to call if rash worsens or does not improve or if any signs of infection including fever, chills, tenderness, drainage, or foul odor, Both verbalized understanding of same.  Pt/caregiver deny any alarms or VAD equipment issues. Pt is completing weekly and monthly maintenance for LVAD equipment. VAD coordinator reviewed daily log from home for daily temperature, weight, and VAD parameters. Pt is performing daily controller and system monitor self tests along with completing weekly and monthly maintenance for LVAD equipment.   [employer] rep here to interrogate ICD for possible diaphragmatic pacing; none found.

## 2014-07-18 ENCOUNTER — Encounter (HOSPITAL_COMMUNITY): Payer: Medicare Other

## 2014-07-18 ENCOUNTER — Other Ambulatory Visit: Payer: Medicare Other

## 2014-07-19 ENCOUNTER — Telehealth (HOSPITAL_COMMUNITY): Payer: Self-pay | Admitting: *Deleted

## 2014-07-20 ENCOUNTER — Other Ambulatory Visit (INDEPENDENT_AMBULATORY_CARE_PROVIDER_SITE_OTHER): Payer: Medicare Other

## 2014-07-20 ENCOUNTER — Ambulatory Visit (HOSPITAL_COMMUNITY): Payer: Self-pay | Admitting: *Deleted

## 2014-07-20 DIAGNOSIS — Z95811 Presence of heart assist device: Secondary | ICD-10-CM

## 2014-07-20 DIAGNOSIS — Z79899 Other long term (current) drug therapy: Secondary | ICD-10-CM

## 2014-07-20 DIAGNOSIS — Z7901 Long term (current) use of anticoagulants: Secondary | ICD-10-CM

## 2014-07-20 LAB — BASIC METABOLIC PANEL
BUN: 22 mg/dL (ref 6–23)
CO2: 26 mEq/L (ref 19–32)
Calcium: 9.3 mg/dL (ref 8.4–10.5)
Chloride: 104 mEq/L (ref 96–112)
Creatinine, Ser: 1.5 mg/dL (ref 0.4–1.5)
GFR: 47.94 mL/min — ABNORMAL LOW (ref >60.00)
Glucose, Bld: 127 mg/dL — ABNORMAL HIGH (ref 70–99)
Potassium: 4 mEq/L (ref 3.5–5.1)
Sodium: 138 mEq/L (ref 135–145)

## 2014-07-20 LAB — PROTIME-INR
INR: 3.2 ratio — ABNORMAL HIGH (ref 0.8–1.0)
Prothrombin Time: 34 s — ABNORMAL HIGH (ref 9.6–13.1)

## 2014-07-20 NOTE — Telephone Encounter (Signed)
Contacted pt's wife and left message regarding missed INR and BMP on 07/18/14. Received message that patient will get done on Friday 07/20/14; re-scheduled appt for patient.

## 2014-07-21 ENCOUNTER — Other Ambulatory Visit (HOSPITAL_COMMUNITY): Payer: Self-pay | Admitting: *Deleted

## 2014-07-21 ENCOUNTER — Telehealth (HOSPITAL_COMMUNITY): Payer: Self-pay | Admitting: *Deleted

## 2014-07-21 DIAGNOSIS — I10 Essential (primary) hypertension: Secondary | ICD-10-CM

## 2014-07-21 MED ORDER — HYDRALAZINE HCL 25 MG PO TABS: 75.0000 mg | ORAL_TABLET | Freq: Three times a day (TID) | ORAL | Status: DC

## 2014-07-22 ENCOUNTER — Other Ambulatory Visit (HOSPITAL_COMMUNITY): Payer: Self-pay | Admitting: Anesthesiology

## 2014-07-23 ENCOUNTER — Encounter: Payer: Self-pay | Admitting: Cardiology

## 2014-07-23 NOTE — Telephone Encounter (Signed)
Called patient's wife per Junie Bame, NP - informed of BMP results; pt to remain on 25 mg of Aldactone. Wife verbalized understanding of same.

## 2014-07-25 ENCOUNTER — Encounter: Payer: Self-pay | Admitting: Internal Medicine

## 2014-07-31 ENCOUNTER — Other Ambulatory Visit (HOSPITAL_COMMUNITY): Payer: Self-pay | Admitting: *Deleted

## 2014-07-31 DIAGNOSIS — Z7901 Long term (current) use of anticoagulants: Secondary | ICD-10-CM

## 2014-07-31 DIAGNOSIS — Z95811 Presence of heart assist device: Secondary | ICD-10-CM

## 2014-08-02 ENCOUNTER — Encounter: Payer: Medicare Other | Admitting: *Deleted

## 2014-08-03 ENCOUNTER — Telehealth: Payer: Self-pay | Admitting: Cardiology

## 2014-08-03 NOTE — Telephone Encounter (Signed)
Spoke with pt and reminded pt of remote transmission that is due today. Pt verbalized understanding.   

## 2014-08-06 ENCOUNTER — Encounter: Payer: Self-pay | Admitting: Cardiology

## 2014-08-07 ENCOUNTER — Telehealth: Payer: Self-pay | Admitting: *Deleted

## 2014-08-07 ENCOUNTER — Other Ambulatory Visit: Payer: Medicare Other

## 2014-08-07 ENCOUNTER — Other Ambulatory Visit: Payer: Self-pay | Admitting: Endocrinology

## 2014-08-07 NOTE — Telephone Encounter (Signed)
Called wife and left message re: missed INR appt today for patient. Asked if he could get checked tomorrow.

## 2014-08-08 ENCOUNTER — Other Ambulatory Visit (INDEPENDENT_AMBULATORY_CARE_PROVIDER_SITE_OTHER): Payer: Medicare Other | Admitting: *Deleted

## 2014-08-08 ENCOUNTER — Encounter: Payer: Self-pay | Admitting: Internal Medicine

## 2014-08-08 ENCOUNTER — Ambulatory Visit (HOSPITAL_COMMUNITY): Payer: Self-pay | Admitting: *Deleted

## 2014-08-08 DIAGNOSIS — Z7901 Long term (current) use of anticoagulants: Secondary | ICD-10-CM

## 2014-08-08 DIAGNOSIS — Z95811 Presence of heart assist device: Secondary | ICD-10-CM

## 2014-08-08 DIAGNOSIS — E890 Postprocedural hypothyroidism: Secondary | ICD-10-CM

## 2014-08-08 LAB — TSH: TSH: 1.97 u[IU]/mL (ref 0.35–4.50)

## 2014-08-08 LAB — T4, FREE: Free T4: 1.41 ng/dL (ref 0.60–1.60)

## 2014-08-08 LAB — PROTIME-INR
INR: 2 ratio — ABNORMAL HIGH (ref 0.8–1.0)
Prothrombin Time: 22.2 s — ABNORMAL HIGH (ref 9.6–13.1)

## 2014-08-09 ENCOUNTER — Encounter (HOSPITAL_COMMUNITY): Payer: Self-pay | Admitting: Internal Medicine

## 2014-08-13 ENCOUNTER — Telehealth (HOSPITAL_COMMUNITY): Payer: Self-pay

## 2014-08-13 ENCOUNTER — Telehealth: Payer: Self-pay | Admitting: Internal Medicine

## 2014-08-13 NOTE — Telephone Encounter (Signed)
Equipment tab in [place] notates [place] ordered 08/03/14. Updated pt he will be receiving an attachment, not a replacement box.

## 2014-08-13 NOTE — Telephone Encounter (Signed)
Called patient and wife to attempt to add onto schedule today per Zada Girt VAD Coordinator.  Patient called earlier c/o possible R groin hematoma.  Left VM to return call.  Renee Pain

## 2014-08-13 NOTE — Telephone Encounter (Signed)
New Msg    Pt wife [name] calling, pt hasn't completed transmission because he needs a new one.    They have requested new one from [employer].   May contact at [phone] if needed

## 2014-08-14 ENCOUNTER — Ambulatory Visit (HOSPITAL_COMMUNITY)
Admission: RE | Admit: 2014-08-14 | Discharge: 2014-08-14 | Disposition: A | Discharge status: Home / Self Care | Payer: Medicare Other | Source: Ambulatory Visit | Attending: Internal Medicine | Admitting: Internal Medicine

## 2014-08-14 DIAGNOSIS — K4090 Unilateral inguinal hernia, without obstruction or gangrene, not specified as recurrent: Secondary | ICD-10-CM

## 2014-08-14 DIAGNOSIS — Z95811 Presence of heart assist device: Secondary | ICD-10-CM

## 2014-08-21 ENCOUNTER — Other Ambulatory Visit (HOSPITAL_COMMUNITY): Payer: Self-pay | Admitting: Internal Medicine

## 2014-08-21 DIAGNOSIS — K4090 Unilateral inguinal hernia, without obstruction or gangrene, not specified as recurrent: Secondary | ICD-10-CM | POA: Insufficient documentation

## 2014-08-21 NOTE — Progress Notes (Signed)
Patient ID: Johnny Oneill, male   DOB: June 07, 1941, 73 y.o.   MRN: [id number]  Brief examination and discussion regarding R inguinal hernia.  We discussed the risks of surgery in the setting of having a VAD in place and need for chronic anti-coagulation.   He will start with a truss and see how it goes. We discussed sign and symptoms of incarceration.  No charge for visit.  Kasyn Rolph,MD 10:58 PM

## 2014-08-27 ENCOUNTER — Other Ambulatory Visit: Payer: Medicare Other

## 2014-08-27 ENCOUNTER — Ambulatory Visit (HOSPITAL_COMMUNITY): Payer: Self-pay | Admitting: Infectious Diseases

## 2014-08-28 ENCOUNTER — Other Ambulatory Visit (INDEPENDENT_AMBULATORY_CARE_PROVIDER_SITE_OTHER): Payer: Medicare Other | Admitting: *Deleted

## 2014-08-28 ENCOUNTER — Encounter: Payer: Self-pay | Admitting: *Deleted

## 2014-08-28 DIAGNOSIS — Z7901 Long term (current) use of anticoagulants: Secondary | ICD-10-CM

## 2014-08-28 DIAGNOSIS — Z95811 Presence of heart assist device: Secondary | ICD-10-CM

## 2014-08-29 ENCOUNTER — Ambulatory Visit (HOSPITAL_COMMUNITY): Payer: Self-pay | Admitting: Infectious Diseases

## 2014-08-29 ENCOUNTER — Other Ambulatory Visit: Payer: Self-pay | Admitting: Infectious Diseases

## 2014-08-29 DIAGNOSIS — Z7901 Long term (current) use of anticoagulants: Secondary | ICD-10-CM

## 2014-08-29 LAB — PROTIME-INR
INR: 1.7 ratio — ABNORMAL HIGH (ref 0.8–1.0)
Prothrombin Time: 18.5 s — ABNORMAL HIGH (ref 9.6–13.1)

## 2014-09-03 ENCOUNTER — Other Ambulatory Visit: Payer: Medicare Other

## 2014-09-04 ENCOUNTER — Other Ambulatory Visit (INDEPENDENT_AMBULATORY_CARE_PROVIDER_SITE_OTHER): Payer: Medicare Other | Admitting: *Deleted

## 2014-09-04 ENCOUNTER — Ambulatory Visit (HOSPITAL_COMMUNITY): Payer: Self-pay | Admitting: *Deleted

## 2014-09-04 DIAGNOSIS — Z95811 Presence of heart assist device: Secondary | ICD-10-CM

## 2014-09-04 DIAGNOSIS — Z7901 Long term (current) use of anticoagulants: Secondary | ICD-10-CM | POA: Diagnosis not present

## 2014-09-04 LAB — PROTIME-INR
INR: 2.3 ratio — ABNORMAL HIGH (ref 0.8–1.0)
Prothrombin Time: 24.9 s — ABNORMAL HIGH (ref 9.6–13.1)

## 2014-09-11 ENCOUNTER — Ambulatory Visit (HOSPITAL_COMMUNITY): Payer: Self-pay | Admitting: Infectious Diseases

## 2014-09-11 ENCOUNTER — Other Ambulatory Visit (HOSPITAL_COMMUNITY): Payer: Self-pay | Admitting: Infectious Diseases

## 2014-09-11 ENCOUNTER — Ambulatory Visit (HOSPITAL_COMMUNITY)
Admission: RE | Admit: 2014-09-11 | Discharge: 2014-09-11 | Disposition: A | Discharge status: Home / Self Care | Payer: Medicare Other | Source: Ambulatory Visit | Attending: Cardiology | Admitting: Cardiology

## 2014-09-11 ENCOUNTER — Encounter (HOSPITAL_COMMUNITY): Payer: Self-pay | Admitting: Infectious Diseases

## 2014-09-11 DIAGNOSIS — I10 Essential (primary) hypertension: Secondary | ICD-10-CM | POA: Diagnosis not present

## 2014-09-11 DIAGNOSIS — Z7982 Long term (current) use of aspirin: Secondary | ICD-10-CM | POA: Diagnosis not present

## 2014-09-11 DIAGNOSIS — R0689 Other abnormalities of breathing: Secondary | ICD-10-CM

## 2014-09-11 DIAGNOSIS — I159 Secondary hypertension, unspecified: Secondary | ICD-10-CM

## 2014-09-11 DIAGNOSIS — I2510 Atherosclerotic heart disease of native coronary artery without angina pectoris: Secondary | ICD-10-CM | POA: Diagnosis not present

## 2014-09-11 DIAGNOSIS — Z950 Presence of cardiac pacemaker: Secondary | ICD-10-CM | POA: Insufficient documentation

## 2014-09-11 DIAGNOSIS — Z95810 Presence of automatic (implantable) cardiac defibrillator: Secondary | ICD-10-CM | POA: Diagnosis not present

## 2014-09-11 DIAGNOSIS — Z7901 Long term (current) use of anticoagulants: Secondary | ICD-10-CM | POA: Diagnosis not present

## 2014-09-11 DIAGNOSIS — E780 Pure hypercholesterolemia: Secondary | ICD-10-CM | POA: Diagnosis not present

## 2014-09-11 DIAGNOSIS — L259 Unspecified contact dermatitis, unspecified cause: Secondary | ICD-10-CM | POA: Insufficient documentation

## 2014-09-11 DIAGNOSIS — I5022 Chronic systolic (congestive) heart failure: Secondary | ICD-10-CM | POA: Insufficient documentation

## 2014-09-11 DIAGNOSIS — E039 Hypothyroidism, unspecified: Secondary | ICD-10-CM

## 2014-09-11 DIAGNOSIS — Z95811 Presence of heart assist device: Secondary | ICD-10-CM

## 2014-09-11 DIAGNOSIS — Z79899 Other long term (current) drug therapy: Secondary | ICD-10-CM | POA: Diagnosis not present

## 2014-09-11 DIAGNOSIS — I428 Other cardiomyopathies: Secondary | ICD-10-CM | POA: Diagnosis not present

## 2014-09-11 DIAGNOSIS — F419 Anxiety disorder, unspecified: Secondary | ICD-10-CM | POA: Insufficient documentation

## 2014-09-11 LAB — COMPREHENSIVE METABOLIC PANEL
ALT: 19 U/L (ref 0–53)
AST: 28 U/L (ref 0–37)
Albumin: 4 g/dL (ref 3.5–5.2)
Alkaline Phosphatase: 189 U/L — ABNORMAL HIGH (ref 39–117)
Anion gap: 10 (ref 5–15)
BUN: 20 mg/dL (ref 6–23)
CO2: 24 mmol/L (ref 19–32)
Calcium: 9.4 mg/dL (ref 8.4–10.5)
Chloride: 106 mEq/L (ref 96–112)
Creatinine, Ser: 1.54 mg/dL — ABNORMAL HIGH (ref 0.50–1.35)
GFR calc Af Amer: 51 mL/min — ABNORMAL LOW (ref >90)
GFR calc non Af Amer: 44 mL/min — ABNORMAL LOW (ref >90)
Glucose, Bld: 81 mg/dL (ref 70–99)
Potassium: 3.8 mmol/L (ref 3.5–5.1)
Sodium: 140 mmol/L (ref 135–145)
Total Bilirubin: 1.9 mg/dL — ABNORMAL HIGH (ref 0.3–1.2)
Total Protein: 7.9 g/dL (ref 6.0–8.3)

## 2014-09-11 LAB — LACTATE DEHYDROGENASE: LDH: 214 U/L (ref 94–250)

## 2014-09-11 LAB — BRAIN NATRIURETIC PEPTIDE: B Natriuretic Peptide: 871 pg/mL — ABNORMAL HIGH (ref 0.0–100.0)

## 2014-09-11 LAB — PROTIME-INR
INR: 1.53 — ABNORMAL HIGH (ref 0.00–1.49)
Prothrombin Time: 18.5 seconds — ABNORMAL HIGH (ref 11.6–15.2)

## 2014-09-11 LAB — PREALBUMIN: Prealbumin: 16.8 mg/dL — ABNORMAL LOW (ref 17.0–34.0)

## 2014-09-11 MED ORDER — ENOXAPARIN SODIUM 40 MG/0.4ML ~~LOC~~ SOLN: 80.0000 mg | Freq: Two times a day (BID) | SUBCUTANEOUS | Status: DC

## 2014-09-11 MED ORDER — FUROSEMIDE 40 MG PO TABS
40.0000 mg | ORAL_TABLET | ORAL | Status: DC
Start: 2014-09-11 — End: 2014-11-01

## 2014-09-11 NOTE — Patient Instructions (Addendum)
1. Take 10 mg of Coumadin today 09/11/14 and tomorrow 09/12/14, then resume 5 mg every day.   2. Recheck INR on Friday at [facility] lab. We will also be checking a BMET (for your potassium) so make sure they collect 2 tubes of blood.   3. Administer lovenox injections into your stomach 1 syringe (80mg ) twice a day starting today 09/11/14. May cause some bruising where you inject. Rotate the injection sites with each injection.   4. Take 40 mg of lasix today then start taking lasix 40 mg once a week on Wednesdays this week.   5. Stop the ChoraPrep to clean your drive line site and continue to clean only with sterile saline wipes. Allow to dry completely before you put the dressing on. Contact dermatitis will over time flack off with dressing changes.   6. May take Claritan or Zyrtec if you are still itching from the rash, but do not put any creams or gel on it directly.   7. Please bring your home equipment (power module, patient cable and your battery charger) for your next visit to complete maintenance on them.

## 2014-09-11 NOTE — Progress Notes (Signed)
Patient ID: Johnny Oneill, male   DOB: 26-Jul-1941, 74 y.o.   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   Admitted 3/4-3/25/15 with SOB and syncopal episode. Found to have SBO and NG placed and started on TPN. On 11/06/13 developed progressive SOB and hypotension and co-ox 51% and Hgb 6.7. Started on milrinone and transfused. Echo showed nl RV function. Patient had PEA arrest and was intubated and started on pressors. CXR revelaed a R hemothorax and a R CT placed. He required VATs  and evacuation of hemothorax and then tansferred to CIR.   Patient saw Dr. Rayann Heman 01/24/14 for device follow up. Per note: pt is stable and on an appropriate medical regimen. Normal ICD function. R waves have decreased since LVAD placement; CXR revealed stable lead position. He plans to follow clinically and try to avoid lead revision if possible.  Patient had [place] in [id number] complicated by suspected right hemothorax (used RIJ access initially, RHC completed via right femoral - see numbers below).  Small pleural effusion only, unable to drain via chest tube.  [place] 01/2014 With VAD 9200  RA = 4  RV = 33/2/6  PA = 33/14 (23)  PCW = 8  Fick cardiac output/index = 6.5/3.7  PVR = 2.3 WU  FA sat = 94%  PA sat = 64%, 69%  Follow up for Heart Failure: Last visit lasix was changed to PRN and Spironolactone was increased to  25 mg daily. Overall feels great. Weight at home 150-154 pounds. Skin irritation around drive line due to chlorhexidine swap from last dressing change. Increase leg edema.. Denies SOB, orthopnea, PND of CP. Trying to drink at least 2L a day.   Reports taking Coumadin as prescribed and adherence to anticoagulation based dietary restrictions.  Denies bright red blood per rectum or melena, no dark urine or hematuria.      Past Medical History  Diagnosis Date  . Ischemic  cardiomyopathy     a. 10/09 Echo: Sev LV Dysfxn, inf/lat AK, Mod MR.  [name];  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 Hypertension   . PVC (premature ventricular contraction)   . Pacemaker   . CHF (congestive heart failure)   . Shortness of breath   . Automatic implantable cardioverter-defibrillator in situ     Current Outpatient Prescriptions  Medication Sig Dispense Refill  . aspirin EC 325 MG tablet Take 325 mg by mouth daily.    . citalopram (CELEXA) 10 MG tablet Take 1 tablet (10 mg total) by mouth daily. 90 tablet 2  . Coenzyme Q10 (COQ10) 200 MG CAPS Take 200 mg by mouth daily.    [name] enoxaparin (LOVENOX) 40 MG/0.4ML injection Inject 0.8 mLs (80 mg total) into the skin every 12 (twelve) hours. 10 Syringe 2  . ezetimibe (ZETIA) 10 MG tablet Take 1 tablet (10 mg total) by mouth daily. 90 tablet  3  . ferrous Q000111Q C-folic acid (TRINSICON / FOLTRIN) capsule Take 1 capsule by mouth daily. 30 capsule 6  . furosemide (LASIX) 40 MG tablet Take 1 tablet (40 mg total) by mouth once a week. 90 tablet 3  . hydrALAZINE (APRESOLINE) 25 MG tablet Take 3 tablets (75 mg total) by mouth 3 (three) times daily. 270 tablet 11  . levothyroxine (SYNTHROID, LEVOTHROID) 175 MCG tablet TAKE ONE TABLET BY MOUTH ONCE DAILY BEFORE BREAKFAST 90 tablet 1  . pantoprazole (PROTONIX) 40 MG tablet Take 1 tablet (40 mg total) by mouth daily. (Patient not taking: Reported on 09/11/2014) 30 tablet 1  . pantoprazole (PROTONIX) 40 MG tablet TAKE ONE TABLET BY MOUTH ONCE DAILY AT  NOON 30 tablet 3  . rosuvastatin (CRESTOR) 20 MG tablet Take 1 tablet (20 mg total) by  mouth daily. 90 tablet 3  . spironolactone (ALDACTONE) 25 MG tablet Take 1 tablet (25 mg total) by mouth daily. 90 tablet 3  . temazepam (RESTORIL) 15 MG capsule Take 1 capsule (15 mg total) by mouth at bedtime. 30 capsule 5  . warfarin (COUMADIN) 5 MG tablet Take 1 tablet (5 mg total) by mouth daily at 6 PM. Take one tab daily or as directed for INR 2 - 3 60 tablet 6   No current facility-administered medications for this encounter.   Facility-Administered Medications Ordered in Other Encounters  Medication Dose Route Frequency Provider Last Rate Last Dose  . etomidate (AMIDATE) injection    Anesthesia Intra-op Sammuel Cooper Mumm, CRNA   20 mg at 02/08/14 0915  . rocuronium [facility]) injection    Anesthesia Intra-op Sammuel Cooper Mumm, CRNA   50 mg at 02/08/14 0932  . succinylcholine (ANECTINE) injection    Anesthesia Intra-op Sammuel Cooper Mumm, CRNA   100 mg at 02/08/14 0915    Ace inhibitors; Diovan; Lipitor; and Norvasc  REVIEW OF SYSTEMS: All systems negative except as listed in HPI, PMH and Problem list.  LVAD interrogation reveals:  Speed:  9200 Flow:  3.5 Power: 4.8 PI: 6.3  Alarms:  none Events: Rare PI events  Fixed speed:  9200  RPM Low speed limit:  8600 RPM Primary Controller: Replace back up battery in  (10/2015) Back up controller: Replace back up battery in (10/2015)  I reviewed the LVAD parameters from today, and compared the results to the patient's prior recorded data.  No programming changes were made.  The LVAD is functioning within specified parameters.  The patient performs LVAD self-test daily.  LVAD interrogation was negative for any significant power changes, alarms or PI events/speed drops.  LVAD equipment check completed and is in good working order.  Back-up equipment present.   LVAD education done on emergency procedures and precautions and reviewed exit site care.    There were no vitals filed for this visit.  BP MAP 120 . With automated cuff 121/62 with MAP  92 HR 84   O2Sats 96%  Weight 161 pounds  Physical Exam: GENERAL: Well appearing, male who presents to clinic today in no acute distress; Wife present HEENT: normal  NECK: Supple, JVP  ~10  no bruits.  No lymphadenopathy or thyromegaly appreciated.    CARDIAC:  Mechanical heart sounds with LVAD hum present.  LUNGS:  Clear to auscultation bilaterally.  ABDOMEN:  Soft, round, nontender, positive bowel sounds x4. Chest jumping like diaphragmatic pacing.   LVAD exit site: well-healed and semi incorporated.  Dressing dry and intact.  No erythema or drainage.  Stabilization device present  and accurately applied.  Driveline dressing is being changed weekly per sterile technique.Dry scales noted around driveline .  EXTREMITIES:  Warm and dry, no cyanosis, clubbing, rash.  Rand LLE 1-2+ edema.  NEUROLOGIC:  Alert and oriented x 4.  Gait steady.  No aphasia.  No dysarthria.  Affect pleasant.     ASSESSMENT AND PLAN:   1) Chronic systolic HF: ICM, EF 0000000 s/p LVAD HM II implant 08/2013 for DT  - NYHA II symptoms and volume status elevated. Take 40 mg po lasix for two days then 40 mg every Wednesday.  -Continue spironolactone to 25 mg daily.  - Not on BB with history of RV failure.  - MAP remains elevated. As above increase spironolactone to 25 mg daily. Check BMET in 7-10 days. Continue hydralazine 75 mg TID.  - He has not tolerated ACE-I or norvasc in the past. 2) LVAD: HMII 08/2013 for DT - Doing well. LVAD parameters stable.  - Check BMET, CBC, pro-BNP, LDH and INR. - Last RAMP Echo 12/2013 will plan to repeat 12/2014 3) Chronic anticoagulation: -  INR goal 2.0-3.0. No bleeding issues. Check INR and adjust accordingly by HF pharmacy. Continue ASA 325 mg for LVAD. 4) HTN:  - Ok. As above diurese for the next 2 days. Continue current regimen.    5) Hypothyroidism: Per Dr Dwyane Dee.  6) Contact Dermatitis - Noted around drive line likely due to chlorhexidine swap. Instructed to stop. Can take claritin  as needed. He will report back to the clinic if dermatitis persists.    F/U 2 months CLEGG,AMY,NP-C 09/11/2014

## 2014-09-11 NOTE — Progress Notes (Signed)
Patient ambulated 1200 feet non-stop on today's 6 minute walk test; tolerated well with very minimal dyspnea with exertion and was able to maintain conversation with me after his test.

## 2014-09-11 NOTE — Progress Notes (Unsigned)
Symptom Yes No Details  Angina  x Activity:  Claudication  x How far:  Syncope  x When:  Stroke  x   Orthopnea  x How many pillows:  PND  x How often:  CPAP  n/a How many hrs:  Pedal edema x  Some swelling noted on ankles/feet, clears with   Abd fullness x  Occasionally, relieved with lasix. Has not taken a dose in 10 days.   N&V  x   Diaphoresis  x When:  Bleeding  x   Urine  x Clear-dark yellow  SOB x  Activity: with activity at an incline and distances   Palpitations  x When:  ICD shock  x   Hospitlizaitons  x When/where/why:  ED visit  x When/where/why:  Other MD  x When/who/why:  Activity     Fluid  x   Diet  x     BP: 120 dopplar MAP automated BP cuff showed 12/62 (92)  Weight: 161.4 lb  HR:  84 SPO2: 99 Last weight:  159.8 (07/15/14)  VAD interrogation revealed: Speed:  9200 Flow:  3.5 Power:  4.8 PI: 6.3 Alarms:  none Events:  Rare events, 0-2 PI events when they occur  Fixed speed: 9200 Low speed limit: 8600 Primary Controller:  Replace back up battery in  __21___  Months. Back up controller:   Replace back up battery in  ___21__  Months.  I reviewed the LVAD parameters from today, and compared the results to the patient's prior recorded data.  No programming changes were made.  The LVAD is functioning within specified parameters.  The patient performs LVAD self-test daily.  LVAD interrogation was negative for any significant power changes, alarms or PI events/speed drops.  LVAD equipment check completed and is in good working order.  Back-up equipment present.   LVAD education done on emergency procedures and precautions and reviewed exit site care.   VAD dressing removed and site care performed using sterile technique. Drive line exit site cleaned with Chlora prep applicators x 2, allowed to dry, and gauze dressing with aquacel strip re-applied. Exit site healing, the velour is fully implanted at exit site. Remains erythematous under aquacel strip; no  tenderness, drainage, or foul odor noted. Drive line anchor re-applied. Pt denies fever or chills. Driveline dressing is being changed daily per sterile technique. Will advance dressing changes to every other day.  Pt/caregiver deny any alarms or VAD equipment issues.  Drive line exit site well healed and incorporated. The velour is fully implanted at exit site. Dressing dry and intact. No erythema or drainage. Stabilization device present and accurately applied. Driveline dressing is being changed weekly per sterile technique using Sorbaview dressing with biopatch on exit site. Pt denies fever or chills. Pt state they have adequate dressing supplies at home.   Pt/caregiver deny any alarms or VAD equipment issues. Pt is completing weekly and monthly maintenance for LVAD equipment. Reviewed calibrating batteries including recognizing prompt, steps to complete, and rationale for doing so. Pt verbalized understanding of same.   VAD coordinator reviewed daily log from home for daily temperature, weight, and VAD parameters. Pt is performing daily controller and system monitor self tests along with completing weekly and monthly maintenance for LVAD equipment.    LVAD equipment check completed and is in good working order. Back-up equipment present. LVAD education done on emergency procedures and precautions and reviewed exit site care.     Annual maintenance will be performed on patient equipment next visi  Reviewed and demonstrated the following to patient and caregiver:               Reviewed the steps for replacing the running system controller with the back-up system controller (see patient handbook section 2 or the appropriate pamphlet).             X  Demonstrated (using the mock-driveline and controller) how to connect and disconnect the mock-driveline in back up system controller in a timely manner (less than 10 seconds) with return demonstration by patient and caregiver.              X    Reviewed system controller alarms and troubleshooting including hazard and advisory alarms and accessing alarm history. Re-enforced NOT TO attempt to perform any task displayed on the display screen alone or without calling the VAD pager [phone] (see patient handbook section 5 or the appropriate pamphlet).                       X  Reviewed how to handle an emergency including when the pump is running and when the pump has stopped (see patient handbook section 8). Call 911 first and then the VAD pager at [phone].             X   Reviewed 14-volt lithium ion battery calibration steps (see patient handbook section 3).            X   Reviewed contents of black bag and what must be with patient at all times.            X  Reviewed driveline exit site including cleansing, dressing, and immobilizing with an anchor device to prevent exit site trauma.             X  Reviewed weekly maintenance which includes: Reviewing "Replacing the [place] with a [employer]" pamphlet. Clean batteries, clips, and battery charger contacts.  Check cables for damages. Rotate batteries; keep all 8 charged.              X  Reviewed monthly maintenance which includes: Reviewing Alarms and Troubleshooting. Check battery manufacturer dates. Check use/charge cycles for each battery; remember to re-calibrate every 70 uses when prompted.              X  Additional pamphlets Guide to Replacing the [place] with the [place] for Patients and Their Caregivers and HM II Alarms for Patients and Their Caregivers given to patient.              X   Batteries Manufacture Date: 08-2012 Number of uses: 57-60 uses  Re-calibration:     Performed by patient   Annual maintenance patient's home power module and universal Char[employer] fundraiser will be performed at the next visit. Will replace patient cable at next visit as well.     Backup system controller 11 volt battery  charged during visit.    1 year Intermacs follow up completed including:  Quality of Life, KCCQ-12, and Neurocognitive trail making.   Pt completed       feet during 6 minute walk.   Arloa Koh, RN

## 2014-09-14 ENCOUNTER — Other Ambulatory Visit (HOSPITAL_COMMUNITY): Payer: Self-pay | Admitting: Infectious Diseases

## 2014-09-14 ENCOUNTER — Other Ambulatory Visit (INDEPENDENT_AMBULATORY_CARE_PROVIDER_SITE_OTHER): Payer: Medicare Other | Admitting: *Deleted

## 2014-09-14 ENCOUNTER — Ambulatory Visit (HOSPITAL_COMMUNITY): Payer: Self-pay | Admitting: Infectious Diseases

## 2014-09-14 DIAGNOSIS — Z7901 Long term (current) use of anticoagulants: Secondary | ICD-10-CM | POA: Diagnosis not present

## 2014-09-14 DIAGNOSIS — Z95811 Presence of heart assist device: Secondary | ICD-10-CM

## 2014-09-14 LAB — PROTIME-INR
INR: 2.7 ratio — ABNORMAL HIGH (ref 0.8–1.0)
Prothrombin Time: 29.6 s — ABNORMAL HIGH (ref 9.6–13.1)

## 2014-09-14 LAB — BASIC METABOLIC PANEL
BUN: 26 mg/dL — ABNORMAL HIGH (ref 6–23)
CO2: 26 mEq/L (ref 19–32)
Calcium: 9.3 mg/dL (ref 8.4–10.5)
Chloride: 104 mEq/L (ref 96–112)
Creatinine, Ser: 1.4 mg/dL (ref 0.40–1.50)
GFR: 53.09 mL/min — ABNORMAL LOW (ref >60.00)
Glucose, Bld: 113 mg/dL — ABNORMAL HIGH (ref 70–99)
Potassium: 4.2 mEq/L (ref 3.5–5.1)
Sodium: 138 mEq/L (ref 135–145)

## 2014-09-19 ENCOUNTER — Encounter: Payer: Self-pay | Admitting: Internal Medicine

## 2014-09-19 ENCOUNTER — Encounter: Payer: Self-pay | Admitting: Licensed Clinical Social Worker

## 2014-09-19 ENCOUNTER — Ambulatory Visit (INDEPENDENT_AMBULATORY_CARE_PROVIDER_SITE_OTHER): Payer: Medicare Other | Admitting: *Deleted

## 2014-09-19 DIAGNOSIS — I255 Ischemic cardiomyopathy: Secondary | ICD-10-CM | POA: Diagnosis not present

## 2014-09-19 DIAGNOSIS — I2589 Other forms of chronic ischemic heart disease: Secondary | ICD-10-CM

## 2014-09-19 DIAGNOSIS — I5022 Chronic systolic (congestive) heart failure: Secondary | ICD-10-CM

## 2014-09-19 NOTE — Progress Notes (Signed)
Remote ICD transmission.   

## 2014-09-20 ENCOUNTER — Ambulatory Visit (HOSPITAL_COMMUNITY)
Admission: RE | Admit: 2014-09-20 | Discharge: 2014-09-20 | Disposition: A | Discharge status: Home / Self Care | Payer: Medicare Other | Source: Ambulatory Visit | Attending: Internal Medicine | Admitting: Internal Medicine

## 2014-09-20 ENCOUNTER — Other Ambulatory Visit (HOSPITAL_COMMUNITY): Payer: Self-pay | Admitting: *Deleted

## 2014-09-20 ENCOUNTER — Ambulatory Visit (HOSPITAL_COMMUNITY): Payer: Self-pay | Admitting: *Deleted

## 2014-09-20 DIAGNOSIS — I5022 Chronic systolic (congestive) heart failure: Secondary | ICD-10-CM | POA: Insufficient documentation

## 2014-09-20 DIAGNOSIS — Z95811 Presence of heart assist device: Secondary | ICD-10-CM

## 2014-09-20 DIAGNOSIS — Z7901 Long term (current) use of anticoagulants: Secondary | ICD-10-CM

## 2014-09-20 LAB — MDC_IDC_ENUM_SESS_TYPE_REMOTE
Battery Voltage: 2.66 V
Brady Statistic AP VP Percent: 0.08 %
Brady Statistic AP VS Percent: 13.45 %
Brady Statistic AS VP Percent: 0 %
Brady Statistic AS VS Percent: 86.48 %
Brady Statistic RA Percent Paced: 13.52 %
Brady Statistic RV Percent Paced: 0.08 %
Date Time Interrogation Session: 20160120211313
HighPow Impedance: 32 Ohm
HighPow Impedance: 40 Ohm
Lead Channel Impedance Value: 356 Ohm
Lead Channel Impedance Value: 384 Ohm
Lead Channel Sensing Intrinsic Amplitude: 1.9968
Lead Channel Sensing Intrinsic Amplitude: 2.3876
Lead Channel Setting Pacing Amplitude: 2 V
Lead Channel Setting Pacing Amplitude: 3 V
Lead Channel Setting Pacing Pulse Width: 1.5 ms
Lead Channel Setting Sensing Sensitivity: 0.3 mV
Zone Setting Detection Interval: 300 ms
Zone Setting Detection Interval: 350 ms
Zone Setting Detection Interval: 350 ms
Zone Setting Detection Interval: 350 ms

## 2014-09-20 LAB — PROTIME-INR
INR: 2.13 — ABNORMAL HIGH (ref 0.00–1.49)
Prothrombin Time: 24 seconds — ABNORMAL HIGH (ref 11.6–15.2)

## 2014-09-21 ENCOUNTER — Other Ambulatory Visit: Payer: Medicare Other

## 2014-09-24 NOTE — Progress Notes (Signed)
CSW met with patient and wife in the clinic for annual LVAD Reassessment.Patient and wife deny any demographic changes. Patient reports he has been compliant with medical regimen and clinic appointments. He states no issues with power outages and has a plan in place for emergencies. He states that he has a Living Will and HPOA. He is independent with care and ambulation and denies any limitations with the exception of showering (assist needed with bag for controller). Both he and his wife are very active in [place] and related organizations providing "ministry" to others. 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 His primary insurance is Medicare and BC/BS as secondary. He denies any use of tobacco, alcohol or illegal drugs. He states he has been feeling "fine" over the past year once through the initial recovery stage of the LVAD. He denies any issues with stressful situations and managing well with coping strategies. His appetite is "better" and sleeping 6-8 hours a night. He scored a 0 on the PHQ-2 and denies any concerns with mental health. He has been consistently attending the VAD support group as well. He states that he has more energy since VAD implantation as improvement and his biggest fear is power failure. 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 Patient ended with how grateful he is to have the opportunity for extended life and improved quality of life with the VAD. When asked if he gets enough support from the VAD team and any improvement to meet his needs patient responded "I get 200% support form the team". He denied any other concerns or needs at this time. CSW will continue to follow and monitor for support. Raquel Sarna, [place]

## 2014-09-25 ENCOUNTER — Encounter: Payer: Self-pay | Admitting: Cardiology

## 2014-10-05 ENCOUNTER — Telehealth (HOSPITAL_COMMUNITY): Payer: Self-pay | Admitting: Infectious Diseases

## 2014-10-05 ENCOUNTER — Other Ambulatory Visit: Payer: Medicare Other

## 2014-10-05 NOTE — Telephone Encounter (Signed)
Patient forgot to get INR today. Will be by the hospital area Monday and will get it done then.

## 2014-10-08 ENCOUNTER — Other Ambulatory Visit (HOSPITAL_COMMUNITY): Payer: Self-pay | Admitting: Infectious Diseases

## 2014-10-08 ENCOUNTER — Ambulatory Visit (HOSPITAL_COMMUNITY): Payer: Self-pay | Admitting: Infectious Diseases

## 2014-10-08 ENCOUNTER — Other Ambulatory Visit (INDEPENDENT_AMBULATORY_CARE_PROVIDER_SITE_OTHER): Payer: Medicare Other | Admitting: *Deleted

## 2014-10-08 DIAGNOSIS — Z7901 Long term (current) use of anticoagulants: Secondary | ICD-10-CM

## 2014-10-08 DIAGNOSIS — Z95811 Presence of heart assist device: Secondary | ICD-10-CM

## 2014-10-08 LAB — PROTIME-INR
INR: 1.9 ratio — ABNORMAL HIGH (ref 0.8–1.0)
Prothrombin Time: 20.8 s — ABNORMAL HIGH (ref 9.6–13.1)

## 2014-10-08 NOTE — Addendum Note (Signed)
Addended by: Eulis Foster on: 10/08/2014 02:47 PM   Modules accepted: Orders

## 2014-10-22 ENCOUNTER — Ambulatory Visit (HOSPITAL_COMMUNITY): Payer: Self-pay | Admitting: Infectious Diseases

## 2014-10-22 ENCOUNTER — Other Ambulatory Visit (INDEPENDENT_AMBULATORY_CARE_PROVIDER_SITE_OTHER): Payer: Medicare Other | Admitting: *Deleted

## 2014-10-22 DIAGNOSIS — Z7901 Long term (current) use of anticoagulants: Secondary | ICD-10-CM

## 2014-10-22 DIAGNOSIS — Z95811 Presence of heart assist device: Secondary | ICD-10-CM | POA: Diagnosis not present

## 2014-10-22 LAB — PROTIME-INR
INR: 2 ratio — ABNORMAL HIGH (ref 0.8–1.0)
Prothrombin Time: 21.3 s — ABNORMAL HIGH (ref 9.6–13.1)

## 2014-10-29 ENCOUNTER — Other Ambulatory Visit: Payer: Self-pay

## 2014-10-29 ENCOUNTER — Other Ambulatory Visit (INDEPENDENT_AMBULATORY_CARE_PROVIDER_SITE_OTHER): Payer: Medicare Other

## 2014-10-29 DIAGNOSIS — E890 Postprocedural hypothyroidism: Secondary | ICD-10-CM

## 2014-10-29 LAB — TSH: TSH: 3.5 u[IU]/mL (ref 0.35–4.50)

## 2014-10-29 LAB — T4, FREE: Free T4: 1.29 ng/dL (ref 0.60–1.60)

## 2014-10-31 ENCOUNTER — Ambulatory Visit (INDEPENDENT_AMBULATORY_CARE_PROVIDER_SITE_OTHER): Payer: Medicare Other | Admitting: Endocrinology

## 2014-10-31 ENCOUNTER — Other Ambulatory Visit (HOSPITAL_COMMUNITY): Payer: Self-pay | Admitting: Infectious Diseases

## 2014-10-31 ENCOUNTER — Telehealth (HOSPITAL_COMMUNITY): Payer: Self-pay | Admitting: *Deleted

## 2014-10-31 ENCOUNTER — Encounter: Payer: Self-pay | Admitting: Endocrinology

## 2014-10-31 VITALS — HR 97 | Temp 98.2°F | Resp 16 | Ht 68.0 in | Wt 160.6 lb

## 2014-10-31 DIAGNOSIS — E890 Postprocedural hypothyroidism: Secondary | ICD-10-CM

## 2014-10-31 DIAGNOSIS — R0600 Dyspnea, unspecified: Secondary | ICD-10-CM

## 2014-10-31 DIAGNOSIS — R0609 Other forms of dyspnea: Secondary | ICD-10-CM

## 2014-10-31 DIAGNOSIS — I2589 Other forms of chronic ischemic heart disease: Secondary | ICD-10-CM

## 2014-10-31 DIAGNOSIS — Z7901 Long term (current) use of anticoagulants: Secondary | ICD-10-CM

## 2014-10-31 DIAGNOSIS — Z95811 Presence of heart assist device: Secondary | ICD-10-CM

## 2014-10-31 NOTE — Patient Instructions (Signed)
EXTRA 1/2 PILL ONCE A WEEK

## 2014-10-31 NOTE — Progress Notes (Signed)
Patient ID: Johnny Oneill, male   DOB: 03/01/1941, 74 y.o.   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 More recently his weight has improved somewhat and is stable.  He is not complaining of unusual weakness and overall feels fairly good except for some shortness of breath on exertion. No cold intolerance  On a previous visit he was taking 200 mcg daily since 3/15 when he was hospitalized  Now he has been on a steady dose of 175 mcg since 11/2013 TSH is appearing to be gradually rising now  On a previous visit he was taking his medication with iron in the morning and was told to change this, he is now taking iron in evening  Compliance with the medical regimen has been as prescribed with taking the tablet in the morning before breakfast.  Wt Readings from Last 3 Encounters:  10/31/14 160 lb 9.6 oz (72.848 kg)  09/11/14 161 lb 6.4 oz (73.211 kg)  06/07/14 161 lb 9.6 oz (73.301 kg)    Lab Results  Component Value Date   TSH 3.50 10/29/2014   TSH 1.97 08/08/2014   TSH 0.94 04/30/2014   TSH 0.75 01/31/2014       Medication List       This list is accurate as of: 10/31/14 10:15 AM.  Always use your most recent med list.               aspirin EC 325 MG tablet  Take 325 mg by mouth daily.     citalopram 10 MG tablet  Commonly known as:  CELEXA  Take 1 tablet (10 mg total) by mouth daily.     CoQ10 200 MG Caps  Take 200 mg by mouth daily.     enoxaparin 40 MG/0.4ML injection  Commonly known as:  LOVENOX  Inject 0.8 mLs (80 mg total) into the skin every 12 (twelve) hours.     ezetimibe 10 MG tablet  Commonly known as:  ZETIA  Take 1 tablet (10 mg total) by mouth daily.     ferrous [id number] C-folic acid capsule  Commonly known as:  TRINSICON / FOLTRIN  Take 1 capsule by mouth daily.     furosemide 40 MG tablet  Commonly known as:  LASIX  Take 1 tablet (40 mg total) by mouth once a week.     hydrALAZINE 25 MG tablet  Commonly known as:  APRESOLINE  Take 3 tablets (75 mg total) by mouth 3 (three) times daily.     levothyroxine 175 MCG tablet  Commonly known as:  SYNTHROID, LEVOTHROID  TAKE ONE TABLET BY MOUTH ONCE DAILY BEFORE BREAKFAST     pantoprazole 40 MG tablet  Commonly known as:  PROTONIX  TAKE ONE TABLET BY MOUTH ONCE DAILY AT  NOON     rosuvastatin 20 MG tablet  Commonly known as:  CRESTOR  Take 1 tablet (20 mg total) by mouth daily.     spironolactone 25 MG tablet  Commonly known as:  ALDACTONE  Take 1 tablet (25 mg total) by mouth daily.     temazepam 15 MG capsule  Commonly known as:  RESTORIL  Take 1 capsule (15 mg total) by mouth at bedtime.  warfarin 5 MG tablet  Commonly known as:  COUMADIN  Take 1 tablet (5 mg total) by mouth daily at 6 PM. Take one tab daily or as directed for INR 2 - 3         Past Medical History  Diagnosis Date  . Ischemic cardiomyopathy     a. 10/09 Echo: Sev LV Dysfxn, inf/lat AK, Mod MR.  [name];  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 Hypertension   . PVC (premature ventricular contraction)   . Pacemaker   . CHF (congestive heart failure)   . Shortness of breath   . Automatic implantable cardioverter-defibrillator in situ     Past Surgical History  Procedure Laterality Date  . Defibrillator  2009  . Total knee arthroplasty  11/27/11      left  . Insert / replace / remove pacemaker  2009  . Coronary angioplasty with stent placement  2004  . Coronary angioplasty with stent placement  07/2008  . Knee arthroscopy w/ partial medial meniscectomy  12/2002    left  . Knee arthroscopy      bilaterally  . Total knee arthroplasty  11/27/2011    Procedure: TOTAL KNEE ARTHROPLASTY;  Surgeon: Yvette Rack., MD;  Location: [place];  Service: Orthopedics;  Laterality: Left;  left total knee arthroplasty  . Esophagogastroduodenoscopy N/A 09/15/2013    Procedure: ESOPHAGOGASTRODUODENOSCOPY (EGD);  Surgeon: Ladene Artist, MD;  Location: [facility] ENDOSCOPY;  Service: Endoscopy;  Laterality: N/A;  bedside  . Insertion of implantable left ventricular assist device N/A 09/18/2013    Procedure: INSERTION OF IMPLANTABLE LEFT VENTRICULAR ASSIST DEVICE;  Surgeon: Ivin Poot, MD;  Location: [facility];  Service: Open Heart Surgery;  Laterality: N/A;  CIRC ARREST  NITRIC OXIDE  . Intraoperative transesophageal echocardiogram N/A 09/18/2013    Procedure: INTRAOPERATIVE TRANSESOPHAGEAL ECHOCARDIOGRAM;  Surgeon: Ivin Poot, MD;  Location: [place];  Service: Open Heart Surgery;  Laterality: N/A;  . Video assisted thoracoscopy Right 11/06/2013    Procedure: VIDEO ASSISTED THORACOSCOPY;  Surgeon: Gaye Pollack, MD;  Location: [place];  Service: Cardiothoracic;  Laterality: Right;  . Hematoma evacuation Right 11/06/2013    Procedure: EVACUATION HEMATOMA;  Surgeon: Gaye Pollack, MD;  Location: [place];  Service: Cardiothoracic;  Laterality: Right;  . Right heart catheterization N/A 08/25/2013    Procedure: RIGHT HEART CATH;  Surgeon: Jolaine Artist, MD;  Location: [facility] CATH LAB;  Service: Cardiovascular;  Laterality: N/A;  . Right heart catheterization N/A 09/13/2013    Procedure: RIGHT HEART CATH;  Surgeon: Jolaine Artist, MD;  Location: [facility] CATH LAB;  Service: Cardiovascular;  Laterality: N/A;  . Intra-aortic balloon pump insertion N/A 09/14/2013    Procedure:  INTRA-AORTIC BALLOON PUMP INSERTION;  Surgeon: Jolaine Artist, MD;  Location: [facility] CATH LAB;  Service: Cardiovascular;  Laterality: N/A;  . Right heart catheterization N/A 02/08/2014    Procedure: RIGHT HEART CATH;  Surgeon: Jolaine Artist, MD;  Location: [facility] C[facility] LAB;  Service: Cardiovascular;  Laterality: N/A;  . Right heart catheterization N/A 02/12/2014    Procedure: RIGHT HEART CATH;  Surgeon: Jolaine Artist, MD;  Location: [facility] CATH LAB;  Service: Cardiovascular;  Laterality: N/A;    Family History  Problem Relation Age of Onset  . Heart failure Father   . Stroke Father   . Coronary artery disease Mother   . Heart disease Mother   . Hyperlipidemia Sister     Social History:  reports that he has never smoked. He has never used smokeless tobacco. He reports that he drinks alcohol. He reports that he does not use illicit drugs.  Allergies:  Allergies  Allergen Reactions  . Ace Inhibitors     Hypotension and elevated creatinine  . Diovan [Valsartan] Other (See Comments)    Hypotension at low dose, hyperkalemia  . Lipitor [Atorvastatin Calcium] Other (See Comments)    Muscle pain  . Norvasc [Amlodipine] Other (See Comments)    Put patient to sleep   ROS:  Hypertension: on no specific medications except diuretics and hydralazine for his CHF  Appettite is good, weight is stable  Wt Readings from Last 3 Encounters:  10/31/14 160 lb 9.6 oz (72.848 kg)  09/11/14 161 lb 6.4 oz (73.211 kg)  06/07/14 161 lb 9.6 oz (73.301 kg)     Examination:   Pulse 97  Temp(Src) 98.2 F (36.8 C)  Resp 16  Ht 5\' 8"  (1.727 m)  Wt 160 lb 9.6 oz (72.848 kg)  BMI 24.42 kg/m2  SpO2 98%  He looks well Biceps reflexes are difficult to elicit but appear normal Neck exam shows no abnormality on palpation    Assessment/Plan:   Post ablative Hypothyroidism:  He has needed fairly stable dose of 175 g of levothyroxine Subjectively he has felt fairly good except for his CHF related  symptoms Since his TSH is trending gradually higher within the normal range will have him increase the dose by half tablet weekly Will have him follow-up in 4 months again Reminded him to take his iron tablet in the evening separately  Martesha Niedermeier 10/31/2014, 10:15 AM

## 2014-10-31 NOTE — Telephone Encounter (Signed)
Wife called to report pt's appetite has been poor over last few days; reports he was tearful last night and seems "depressed". 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  Asked if pt needs to come to clinic, she says he is coming next week. VAD parameters within normal limits, no VAD alarms, fevers, or chills.   Wife called later in the day to report pt has "nausea" and has had for several days.  MAP 120, PI 7.0; no vomiting. Updated Darrick Grinder, NP and instructed pt to come to clinic tomorrow at 9 am. Instructed to call VAD pager or report to ED if symptoms worsen overnight; she agreed to same.

## 2014-11-01 ENCOUNTER — Other Ambulatory Visit (HOSPITAL_COMMUNITY): Payer: Self-pay | Admitting: Infectious Diseases

## 2014-11-01 ENCOUNTER — Encounter (HOSPITAL_COMMUNITY): Payer: Medicare Other | Admitting: Internal Medicine

## 2014-11-01 ENCOUNTER — Ambulatory Visit (HOSPITAL_COMMUNITY)
Admission: RE | Admit: 2014-11-01 | Discharge: 2014-11-01 | Disposition: A | Discharge status: Home / Self Care | Payer: Medicare Other | Source: Ambulatory Visit | Attending: Internal Medicine | Admitting: Internal Medicine

## 2014-11-01 ENCOUNTER — Ambulatory Visit (HOSPITAL_COMMUNITY): Payer: Self-pay | Admitting: Infectious Diseases

## 2014-11-01 ENCOUNTER — Encounter (HOSPITAL_COMMUNITY): Payer: Self-pay

## 2014-11-01 VITALS — BP 114/52 | HR 78 | Wt 158.0 lb

## 2014-11-01 DIAGNOSIS — I4729 Other ventricular tachycardia: Secondary | ICD-10-CM

## 2014-11-01 DIAGNOSIS — I10 Essential (primary) hypertension: Secondary | ICD-10-CM | POA: Diagnosis not present

## 2014-11-01 DIAGNOSIS — E876 Hypokalemia: Secondary | ICD-10-CM | POA: Diagnosis not present

## 2014-11-01 DIAGNOSIS — I472 Ventricular tachycardia: Secondary | ICD-10-CM

## 2014-11-01 DIAGNOSIS — Z95811 Presence of heart assist device: Secondary | ICD-10-CM | POA: Diagnosis not present

## 2014-11-01 DIAGNOSIS — G4700 Insomnia, unspecified: Secondary | ICD-10-CM

## 2014-11-01 DIAGNOSIS — E890 Postprocedural hypothyroidism: Secondary | ICD-10-CM

## 2014-11-01 DIAGNOSIS — R0609 Other forms of dyspnea: Secondary | ICD-10-CM

## 2014-11-01 DIAGNOSIS — Z7901 Long term (current) use of anticoagulants: Secondary | ICD-10-CM | POA: Diagnosis not present

## 2014-11-01 DIAGNOSIS — R630 Anorexia: Secondary | ICD-10-CM

## 2014-11-01 DIAGNOSIS — I5022 Chronic systolic (congestive) heart failure: Secondary | ICD-10-CM

## 2014-11-01 DIAGNOSIS — I480 Paroxysmal atrial fibrillation: Secondary | ICD-10-CM | POA: Insufficient documentation

## 2014-11-01 DIAGNOSIS — I499 Cardiac arrhythmia, unspecified: Secondary | ICD-10-CM | POA: Diagnosis not present

## 2014-11-01 DIAGNOSIS — I517 Cardiomegaly: Secondary | ICD-10-CM | POA: Diagnosis not present

## 2014-11-01 DIAGNOSIS — E039 Hypothyroidism, unspecified: Secondary | ICD-10-CM | POA: Diagnosis not present

## 2014-11-01 DIAGNOSIS — R0600 Dyspnea, unspecified: Secondary | ICD-10-CM

## 2014-11-01 DIAGNOSIS — R0602 Shortness of breath: Secondary | ICD-10-CM | POA: Diagnosis not present

## 2014-11-01 LAB — PROTIME-INR
INR: 1.75 — ABNORMAL HIGH (ref 0.00–1.49)
Prothrombin Time: 20.6 seconds — ABNORMAL HIGH (ref 11.6–15.2)

## 2014-11-01 LAB — BASIC METABOLIC PANEL
Anion gap: 8 (ref 5–15)
BUN: 15 mg/dL (ref 6–23)
CO2: 28 mmol/L (ref 19–32)
Calcium: 9.2 mg/dL (ref 8.4–10.5)
Chloride: 103 mmol/L (ref 96–112)
Creatinine, Ser: 1.52 mg/dL — ABNORMAL HIGH (ref 0.50–1.35)
GFR calc Af Amer: 51 mL/min — ABNORMAL LOW (ref >90)
GFR calc non Af Amer: 44 mL/min — ABNORMAL LOW (ref >90)
Glucose, Bld: 133 mg/dL — ABNORMAL HIGH (ref 70–99)
Potassium: 3.2 mmol/L — ABNORMAL LOW (ref 3.5–5.1)
Sodium: 139 mmol/L (ref 135–145)

## 2014-11-01 LAB — CBC
HCT: 38.1 % — ABNORMAL LOW (ref 39.0–52.0)
Hemoglobin: 12.4 g/dL — ABNORMAL LOW (ref 13.0–17.0)
MCH: 30.5 pg (ref 26.0–34.0)
MCHC: 32.5 g/dL (ref 30.0–36.0)
MCV: 93.8 fL (ref 78.0–100.0)
Platelets: 126 10*3/uL — ABNORMAL LOW (ref 150–400)
RBC: 4.06 MIL/uL — ABNORMAL LOW (ref 4.22–5.81)
RDW: 16.9 % — ABNORMAL HIGH (ref 11.5–15.5)
WBC: 6 10*3/uL (ref 4.0–10.5)

## 2014-11-01 LAB — MAGNESIUM: Magnesium: 1.9 mg/dL (ref 1.5–2.5)

## 2014-11-01 LAB — TSH: TSH: 3.919 u[IU]/mL (ref 0.350–4.500)

## 2014-11-01 LAB — BRAIN NATRIURETIC PEPTIDE: B Natriuretic Peptide: 682.7 pg/mL — ABNORMAL HIGH (ref 0.0–100.0)

## 2014-11-01 LAB — T4, FREE: Free T4: 1.63 ng/dL (ref 0.80–1.80)

## 2014-11-01 LAB — LACTATE DEHYDROGENASE: LDH: 229 U/L (ref 94–250)

## 2014-11-01 MED ORDER — TEMAZEPAM 15 MG PO CAPS: 15.0000 mg | ORAL_CAPSULE | Freq: Every day | ORAL | Status: DC

## 2014-11-01 MED ORDER — FUROSEMIDE 40 MG PO TABS: 40.0000 mg | ORAL_TABLET | ORAL | Status: DC

## 2014-11-01 MED ORDER — POTASSIUM CHLORIDE CRYS ER 20 MEQ PO TBCR: 20.0000 meq | EXTENDED_RELEASE_TABLET | ORAL | Status: DC

## 2014-11-01 NOTE — Progress Notes (Addendum)
Patient ID: Johnny Oneill, male   DOB: Nov 22, 1940, 74 y.o.   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   Admitted 3/4-3/25/15 with SOB and syncopal episode. Found to have SBO and NG placed and started on TPN. On 11/06/13 developed progressive SOB and hypotension and co-ox 51% and Hgb 6.7. Started on milrinone and transfused. Echo showed nl RV function. Patient had PEA arrest and was intubated and started on pressors. CXR revelaed a R hemothorax and a R CT placed. He required VATs  and evacuation of hemothorax and then tansferred to CIR.   Patient saw Dr. Rayann Heman 01/24/14 for device follow up. Per note: pt is stable and on an appropriate medical regimen. Normal ICD function. R waves have decreased since LVAD placement; CXR revealed stable lead position. He plans to follow clinically and try to avoid lead revision if possible.   Follow up for Heart Failure: Returns for unscheduled f/u. Over past few days appetite has been poor and feels taste is diminished. Otherwise feels ok. No ab pain, n/v, diarrhea, melena. No dizziness. No palpitations. Taking lasix once a week on Wednesday.  Weight stable. No problem with driveline.  Denies SOB, orthopnea, PND of CP. Trying to drink at least 2L a day.   Reports taking Coumadin as prescribed and adherence to anticoagulation based dietary restrictions.  Denies bright red blood per rectum or melena, no dark urine or hematuria.     ICD interrogated personally: Optivol up and down. Now up. Activity tolerance good. No VT. 2 episodes of AF on Feb 13. 248 PVCs per hour (markedly increased)   Past Medical History  Diagnosis Date  . Ischemic cardiomyopathy     a. 10/09 Echo: Sev LV Dysfxn, inf/lat AK, Mod MR.  [name] Hypercholesteremia   . Osteoarthritis     a. s/p R TKR  . Anxiety   . Hypothyroidism   . CAD (coronary artery  disease)     S/P stenting of LCX in 2004;  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 Hypertension   . PVC (premature ventricular contraction)   . Pacemaker   . CHF (congestive heart failure)   . Shortness of breath   . Automatic implantable cardioverter-defibrillator in situ     Current Outpatient Prescriptions  Medication Sig Dispense Refill  . aspirin EC 325 MG tablet Take 325 mg by mouth daily.    . citalopram (CELEXA) 10 MG tablet Take 1 tablet (10 mg total) by mouth daily. 90 tablet 2  . Coenzyme Q10 (COQ10) 200 MG CAPS Take 200 mg by mouth daily.    [name] ezetimibe (ZETIA) 10 MG tablet Take 1 tablet (10 mg total) by mouth daily. 90 tablet 3  . ferrous [id number] C-folic acid (TRINSICON / FOLTRIN) capsule Take 1 capsule by mouth daily. 30 capsule 6  . furosemide (LASIX) 40 MG tablet Take 1 tablet (40 mg total) by mouth 2 (two) times a week. Take every Monday and every Friday. Hold the second dose if your weight is not elevated. 90 tablet 3  . hydrALAZINE (APRESOLINE) 25 MG tablet Take 3 tablets (  75 mg total) by mouth 3 (three) times daily. 270 tablet 11  . levothyroxine (SYNTHROID, LEVOTHROID) 175 MCG tablet TAKE ONE TABLET BY MOUTH ONCE DAILY BEFORE BREAKFAST 90 tablet 1  . pantoprazole (PROTONIX) 40 MG tablet TAKE ONE TABLET BY MOUTH ONCE DAILY AT  NOON 30 tablet 3  . rosuvastatin (CRESTOR) 20 MG tablet Take 1 tablet (20 mg total) by mouth daily. 90 tablet 3  . spironolactone (ALDACTONE) 25 MG tablet Take 1 tablet (25 mg total) by mouth daily. 90 tablet 3  . temazepam (RESTORIL) 15 MG capsule Take 1 capsule (15 mg total) by mouth at bedtime. 30 capsule 5  . warfarin (COUMADIN) 5 MG tablet Take 1 tablet (5 mg total) by mouth daily at 6 PM. Take one tab daily or as directed for INR 2 - 3 60  tablet 6  . enoxaparin (LOVENOX) 40 MG/0.4ML injection Inject 0.8 mLs (80 mg total) into the skin every 12 (twelve) hours. (Patient not taking: Reported on 11/01/2014) 10 Syringe 2   No current facility-administered medications for this encounter.   Facility-Administered Medications Ordered in Other Encounters  Medication Dose Route Frequency Provider Last Rate Last Dose  . etomidate (AMIDATE) injection    Anesthesia Intra-op Sammuel Cooper Mumm, CRNA   20 mg at 02/08/14 0915  . rocuronium [facility]) injection    Anesthesia Intra-op Sammuel Cooper Mumm, CRNA   50 mg at 02/08/14 0932  . succinylcholine (ANECTINE) injection    Anesthesia Intra-op Sammuel Cooper Mumm, CRNA   100 mg at 02/08/14 0915    Ace inhibitors; Diovan; Lipitor; and Norvasc  REVIEW OF SYSTEMS: All systems negative except as listed in HPI, PMH and Problem list.  LVAD interrogation reveals:  Speed: 9200 Flow: 4.6 Power: 5.3 PI: 7.5 Alarms: 1 low flow 10/31/14 @ 13:15  Events: rare PI events at various times throughout the days and do not occur daily  Fixed speed: 9200 Low speed limit: 8600 Primary Controller: Replace back up battery in __19___ Months. (April 2017) Back up controller: Replace back up battery in __19___ Months. (April 2017)  I reviewed the LVAD parameters from today, and compared the results to the patient's prior recorded data.  No programming changes were made.  The LVAD is functioning within specified parameters.  The patient performs LVAD self-test daily.  LVAD interrogation was negative for any significant power changes, alarms or PI events/speed drops.  LVAD equipment check completed and is in good working order.  Back-up equipment present.   LVAD education done on emergency procedures and precautions and reviewed exit site care.    Filed Vitals:   11/01/14 0950 11/01/14 0951  BP: 98/0 114/52  Pulse: 78   Weight: 158 lb (71.668 kg)   SpO2: 100%     BP MAP 120 . With automated cuff 121/62 with MAP  92 HR 84   O2Sats 96%  Weight 161 pounds  Physical Exam: GENERAL: Well appearing, male who presents to clinic today in no acute distress; Wife present HEENT: normal  NECK: Supple, JVP  ~10  no bruits. Prominene CV waves  No lymphadenopathy or thyromegaly appreciated.    CARDIAC:  Mechanical heart sounds with LVAD hum present. Irregular heart sounds LUNGS:  Clear to auscultation bilaterally.  ABDOMEN:  Soft, round, nontender, positive bowel sounds x4. Mild distension. nontender LVAD exit site: well-healed and semi incorporated.  Dressing dry and intact.  No erythema or drainage.  Stabilization device present and accurately applied.  Driveline dressing is being changed weekly per sterile  technique.Dry scales noted around driveline .  EXTREMITIES:  Warm and dry, no cyanosis, clubbing, rash.  Rand LLE tr-1+ edema.  NEUROLOGIC:  Alert and oriented x 4.  Gait steady.  No aphasia.  No dysarthria.  Affect pleasant.     ASSESSMENT AND PLAN:   1) Chronic systolic HF: ICM, EF 0000000 s/p LVAD HM II implant 08/2013 for DT  - NYHA II symptoms and volume status mildly elevated. Increase lasix to 40mg  on Monday and Friday.  Hold if weight down. Take with KCL.  -Continue spironolactone to 25 mg daily.  - Not on BB with history of RV failure.  - MAP good. Continue current regimen.  - He has not tolerated ACE-I or norvasc in the past. 2) LVAD: HMII 08/2013 for DT - Doing well. LVAD parameters stable. Low flow alarm may be due to episode of AF or PVCs. Will get ECG today.  - Check BMET, CBC, pro-BNP, LDH and INR. - Last RAMP Echo 12/2013 will plan to repeat 12/2014 3) Chronic anticoagulation: -  INR goal 2.0-3.0. No bleeding issues. Check INR and adjust accordingly by HF pharmacy. Continue ASA 325 mg for LVAD. 4) HTN:  - Ok.  5) Hypothyroidism: Per Dr Dwyane Dee. Labs good.  6) Decreased appetite: May be due to volume overload. Increasing lasix 7) PVCs and PAF: In sinus now. May be related to hypokalemia or  suction events. Supp K+. Continue magnesium. Check CXR for VAD angulation 8) Hypokalemia: Will supplement  F/U 2 months  Benay Spice  11/01/2014

## 2014-11-01 NOTE — Progress Notes (Signed)
[name] comes to clinic today with his wife for a sick visit. Primary concern is feeling nauseated, no appetite and inability to taste most foods.    Symptom Yes No Details  Angina  x Activity:  Claudication  x How far:  Syncope  x When:  Stroke  x   Orthopnea  x How many pillows:  PND  x How often:  CPAP  n/a How many hrs:  Pedal edema  x   Abd fullness  x   N&V x  Nausea for last few days, unable to taste his food and has no desire to eat.   Diaphoresis  x When:  Bleeding  x   Urine  x Clear yellow  SOB  x Activity:  Palpitations  x When:  ICD shock  x   Hospitlizaitons  x When/where/why:  ED visit  x When/where/why:  Other MD x  When/who/why:Dr. Dwyane Dee (endocrine) TSH reportedly controlled well with current dose.   Activity  x Tolerating normal activity   Fluid x    < 2L intake d/t nausea  Diet x    Poor dietary intake r/t decreased taste & nausea.     BP: 98 Dopplar 114/52 (84)  Weight: 158 lb HR:  78 SPO2: 100% Last weight: 161.4 lb   VAD interrogation revealed: Speed:  9200 Flow:  4.6 Power:  5.3 PI: 7.5 Alarms: 1 low flow 10/31/14 @ 13:15   Events:  rare PI events at various times throughout the days and do not occur daily  Fixed speed: 9200 Low speed limit: 8600 Primary Controller:  Replace back up battery in  __19___  Months. (April 2017) Back up controller:   Replace back up battery in  __19___  Months. (April 2017)  I reviewed the LVAD parameters from today, and compared the results to the patient's prior recorded data.  No programming changes were made.  The LVAD is functioning within specified parameters.  The patient performs LVAD self-test daily.  LVAD interrogation shows rare PI events at various times throughout the days and do not occur daily; One isolated LOW FLOW alarm on 10/31/14; no speed drops.  LVAD equipment check completed and is in good working order.  Back-up equipment present.    Drive line exit site well healed and incorporated. The velour is  fully implanted at exit site. Dressing dry and intact. No erythema or drainage. Stabilization device present and accurately applied. Driveline dressing is being changed weekly per sterile technique using Sorbaview dressing with biopatch on exit site. Pt denies fever or chills. Pt state they have adequate dressing supplies at home. Contact dermatitis prompted D/C of Chlora Prep and now are performing cleaning with Sterile Saline. Dressing needs to be changed, however they are requesting to do this at home after his shower.     Optivol, EKG, and device interrogation obtained per [name]; EKG showing SR with frequent PVCs. Interrogation showed 2 occurrences of Afib on 2/13 one starting at about 8am lasting 2h 14m and second starting around 1200 lasting approx 1 h. Patient does not recall being symptomatic during those times. Per [employer] report he is having about 4 PVC/min and per [name] we will plan on a device interrogation at the next visit in 3w to re-evaluate PVC burden. 2 view CXR to be obtained to re-evaluate cannula position.   Johnny Oneill will bring his PM, Battery Charger and patient cable to clinic next Thursday 11/08/14 @ 10:30 for annual maintenance. Will coordinate with [employer].

## 2014-11-01 NOTE — Patient Instructions (Addendum)
1. Cancel appointment with [place] on 11/13/14, follow up in 1 month  2. Will cancel your lab appointment for 11/05/14 and reschedule you for 11/08/14 3. Plan for annual maintenance for your Power Module, Patient cable, and Battery Charger on 11/08/14--Leave eqt with Sagal Gayton/Molly during Judy's orientation at 10:30 and will be ready for pick up.  4. Increase your lasix to twice a week (on Monday and Friday). Do not take the second dose if your weight is BELOW 153 lbs.  5. Take warfarin 7.5mg  today and tomorrow and recheck INR 11/08/14 6. 80 mEq (4 pills) potassium today with your extra dose of lasix then take one 20 mEq pill every time you take your lasix.

## 2014-11-05 ENCOUNTER — Other Ambulatory Visit: Payer: Medicare Other

## 2014-11-08 ENCOUNTER — Ambulatory Visit (HOSPITAL_COMMUNITY): Payer: Self-pay | Admitting: Infectious Diseases

## 2014-11-08 ENCOUNTER — Other Ambulatory Visit: Payer: Medicare Other

## 2014-11-08 ENCOUNTER — Ambulatory Visit (HOSPITAL_COMMUNITY)
Admission: RE | Admit: 2014-11-08 | Discharge: 2014-11-08 | Disposition: A | Discharge status: Home / Self Care | Payer: Medicare Other | Source: Ambulatory Visit | Attending: Internal Medicine | Admitting: Internal Medicine

## 2014-11-08 DIAGNOSIS — Z7901 Long term (current) use of anticoagulants: Secondary | ICD-10-CM

## 2014-11-08 DIAGNOSIS — D649 Anemia, unspecified: Secondary | ICD-10-CM

## 2014-11-08 DIAGNOSIS — I5022 Chronic systolic (congestive) heart failure: Secondary | ICD-10-CM | POA: Diagnosis not present

## 2014-11-08 DIAGNOSIS — Z95811 Presence of heart assist device: Secondary | ICD-10-CM

## 2014-11-08 LAB — PROTIME-INR
INR: 1.58 — ABNORMAL HIGH (ref 0.00–1.49)
Prothrombin Time: 19 seconds — ABNORMAL HIGH (ref 11.6–15.2)

## 2014-11-08 MED ORDER — WARFARIN SODIUM 5 MG PO TABS: ORAL_TABLET | ORAL | Status: DC

## 2014-11-08 MED ORDER — ENOXAPARIN SODIUM 40 MG/0.4ML ~~LOC~~ SOLN: 70.0000 mg | Freq: Two times a day (BID) | SUBCUTANEOUS | Status: DC

## 2014-11-12 ENCOUNTER — Other Ambulatory Visit (INDEPENDENT_AMBULATORY_CARE_PROVIDER_SITE_OTHER): Payer: Medicare Other | Admitting: *Deleted

## 2014-11-12 DIAGNOSIS — Z95811 Presence of heart assist device: Secondary | ICD-10-CM

## 2014-11-12 DIAGNOSIS — Z7901 Long term (current) use of anticoagulants: Secondary | ICD-10-CM | POA: Diagnosis not present

## 2014-11-12 LAB — PROTIME-INR
INR: 1.9 ratio — ABNORMAL HIGH (ref 0.8–1.0)
Prothrombin Time: 20.7 s — ABNORMAL HIGH (ref 9.6–13.1)

## 2014-11-13 ENCOUNTER — Ambulatory Visit: Payer: Self-pay | Admitting: *Deleted

## 2014-11-14 ENCOUNTER — Encounter (HOSPITAL_COMMUNITY): Payer: Medicare Other

## 2014-11-14 ENCOUNTER — Other Ambulatory Visit: Payer: Self-pay | Admitting: *Deleted

## 2014-11-14 MED ORDER — LEVOTHYROXINE SODIUM 175 MCG PO TABS: ORAL_TABLET | ORAL | Status: DC

## 2014-11-19 ENCOUNTER — Ambulatory Visit (HOSPITAL_COMMUNITY)
Admission: RE | Admit: 2014-11-19 | Discharge: 2014-11-19 | Disposition: A | Discharge status: Home / Self Care | Payer: Medicare Other | Source: Ambulatory Visit | Attending: Internal Medicine | Admitting: Internal Medicine

## 2014-11-19 ENCOUNTER — Other Ambulatory Visit (HOSPITAL_COMMUNITY): Payer: Self-pay | Admitting: *Deleted

## 2014-11-19 VITALS — BP 104/0 | HR 84 | Ht 69.0 in | Wt 156.4 lb

## 2014-11-19 DIAGNOSIS — Z95811 Presence of heart assist device: Secondary | ICD-10-CM

## 2014-11-19 DIAGNOSIS — E780 Pure hypercholesterolemia: Secondary | ICD-10-CM | POA: Diagnosis not present

## 2014-11-19 DIAGNOSIS — I252 Old myocardial infarction: Secondary | ICD-10-CM | POA: Insufficient documentation

## 2014-11-19 DIAGNOSIS — I5022 Chronic systolic (congestive) heart failure: Secondary | ICD-10-CM | POA: Insufficient documentation

## 2014-11-19 DIAGNOSIS — Z950 Presence of cardiac pacemaker: Secondary | ICD-10-CM | POA: Diagnosis not present

## 2014-11-19 DIAGNOSIS — I480 Paroxysmal atrial fibrillation: Secondary | ICD-10-CM | POA: Insufficient documentation

## 2014-11-19 DIAGNOSIS — Z7901 Long term (current) use of anticoagulants: Secondary | ICD-10-CM | POA: Insufficient documentation

## 2014-11-19 DIAGNOSIS — I493 Ventricular premature depolarization: Secondary | ICD-10-CM | POA: Insufficient documentation

## 2014-11-19 DIAGNOSIS — Z7982 Long term (current) use of aspirin: Secondary | ICD-10-CM | POA: Diagnosis not present

## 2014-11-19 DIAGNOSIS — I472 Ventricular tachycardia: Secondary | ICD-10-CM | POA: Insufficient documentation

## 2014-11-19 DIAGNOSIS — I255 Ischemic cardiomyopathy: Secondary | ICD-10-CM | POA: Insufficient documentation

## 2014-11-19 DIAGNOSIS — I10 Essential (primary) hypertension: Secondary | ICD-10-CM | POA: Insufficient documentation

## 2014-11-19 DIAGNOSIS — F419 Anxiety disorder, unspecified: Secondary | ICD-10-CM | POA: Insufficient documentation

## 2014-11-19 DIAGNOSIS — R0600 Dyspnea, unspecified: Secondary | ICD-10-CM

## 2014-11-19 DIAGNOSIS — I5023 Acute on chronic systolic (congestive) heart failure: Secondary | ICD-10-CM

## 2014-11-19 DIAGNOSIS — E039 Hypothyroidism, unspecified: Secondary | ICD-10-CM | POA: Insufficient documentation

## 2014-11-19 DIAGNOSIS — Z79899 Other long term (current) drug therapy: Secondary | ICD-10-CM | POA: Insufficient documentation

## 2014-11-19 DIAGNOSIS — I2510 Atherosclerotic heart disease of native coronary artery without angina pectoris: Secondary | ICD-10-CM | POA: Diagnosis not present

## 2014-11-19 DIAGNOSIS — I4729 Other ventricular tachycardia: Secondary | ICD-10-CM

## 2014-11-19 LAB — BASIC METABOLIC PANEL
Anion gap: 8 (ref 5–15)
BUN: 21 mg/dL (ref 6–23)
CO2: 28 mmol/L (ref 19–32)
Calcium: 9.6 mg/dL (ref 8.4–10.5)
Chloride: 104 mmol/L (ref 96–112)
Creatinine, Ser: 1.31 mg/dL (ref 0.50–1.35)
GFR calc Af Amer: 62 mL/min — ABNORMAL LOW (ref >90)
GFR calc non Af Amer: 53 mL/min — ABNORMAL LOW (ref >90)
Glucose, Bld: 71 mg/dL (ref 70–99)
Potassium: 3.9 mmol/L (ref 3.5–5.1)
Sodium: 140 mmol/L (ref 135–145)

## 2014-11-19 LAB — MAGNESIUM: Magnesium: 2.1 mg/dL (ref 1.5–2.5)

## 2014-11-19 LAB — LACTATE DEHYDROGENASE: LDH: 229 U/L (ref 94–250)

## 2014-11-19 LAB — CBC
HCT: 39.9 % (ref 39.0–52.0)
Hemoglobin: 12.9 g/dL — ABNORMAL LOW (ref 13.0–17.0)
MCH: 30.9 pg (ref 26.0–34.0)
MCHC: 32.3 g/dL (ref 30.0–36.0)
MCV: 95.5 fL (ref 78.0–100.0)
Platelets: 141 10*3/uL — ABNORMAL LOW (ref 150–400)
RBC: 4.18 MIL/uL — ABNORMAL LOW (ref 4.22–5.81)
RDW: 15.9 % — ABNORMAL HIGH (ref 11.5–15.5)
WBC: 6.3 10*3/uL (ref 4.0–10.5)

## 2014-11-19 LAB — PROTIME-INR
INR: 1.97 — ABNORMAL HIGH (ref 0.00–1.49)
Prothrombin Time: 22.6 seconds — ABNORMAL HIGH (ref 11.6–15.2)

## 2014-11-19 LAB — BRAIN NATRIURETIC PEPTIDE: B Natriuretic Peptide: 430.4 pg/mL — ABNORMAL HIGH (ref 0.0–100.0)

## 2014-11-19 MED ORDER — FUROSEMIDE 20 MG PO TABS: 20.0000 mg | ORAL_TABLET | ORAL | Status: DC | PRN

## 2014-11-19 NOTE — Progress Notes (Signed)
Johnny Oneill comes to clinic today with his wife for a three week f/u visit. Patient feeling much better than last visit; states he has full return of appetite and taste.   Symptom Yes No Details  Angina  x Activity:  Claudication  x How far:  Syncope  x When:  Orthostatic dizziness with lasix dose   Stroke  x   Orthopnea  x How many pillows:  1  PND  x How often:  CPAP  n/a How many hrs:  Pedal edema  x   Abd fullness  x   N&V  x   Diaphoresis  x When:  Bleeding  x   Urine  x med yellow  SOB  x Activity:  Palpitations  x When:   ICD shock  x   Hospitlizaitons  x When/where/why:  ED visit  x When/where/why:  Other MD x  10/31/14 - increased synthroid by 1/2 pill once weekly   Activity  x Tolerating normal activity   Fluid  x  No limitations; < 2 liters/day  Diet  x  low salt    BP: Doppler MAP 104 Auto Cuff:  112/60 (85) HR:  84 SPO2: 98 Weight: 156.4  Lb Last weight:  158 lbs     VAD interrogation revealed: Speed:  9200 Flow:  4.6 Power:  5.3 PI: 7.5 Alarms: rare low flow advisories Events:  rare PI events Fixed speed: 9200 Low speed limit: 8600 Primary Controller:  Replace back up battery in  19 months. (April 2017) Back up controller:   Replace back up battery in  19 months. (April 2017)  I reviewed the LVAD parameters from today, and compared the results to the patient's prior recorded data.  No programming changes were made.  The LVAD is functioning within specified parameters.  The patient performs LVAD self-test daily.  LVAD interrogation shows no speed drops.  LVAD equipment check completed and is in good working order.  Back-up equipment present.    Drive line exit site well healed and incorporated. The velour is fully implanted at exit site. Dressing dry and intact. No erythema or drainage. Stabilization device present and accurately applied. Driveline dressing is being changed weekly per sterile technique using Sorbaview dressing with biopatch on exit site. Pt  denies fever or chills. Pt state they have adequate dressing supplies at home. Contact dermatitis prompted D/C of Chlora Prep and now are performing cleaning with Sterile Saline with site improvement noted.     Medtronic device interrogation per rep for PVC burden;  PVC today 384 per hour (11/01/14 243 per hour).  One short run VT (25 beat run) at 200 bpm with   Reviewed and demonstrated the following to patient and caregiver:               Reviewed the steps for replacing the running system controller with the back-up system controller (see patient handbook section 2 or the appropriate pamphlet).             X  Demonstrated (using the mock-driveline and controller) how to connect and disconnect the mock-driveline in back up system controller in a timely manner (less than 10 seconds) with return demonstration by patient and caregiver.              X   Reviewed system controller alarms and troubleshooting including hazard and advisory alarms and accessing alarm history. Re-enforced NOT TO attempt to perform any task displayed on the display screen alone or without calling the  VAD pager [phone] (see patient handbook section 5 or the appropriate pamphlet).                       X  Reviewed how to handle an emergency including when the pump is running and when the pump has stopped (see patient handbook section 8). Call 911 first and then the VAD pager at [phone].             X   Reviewed 14-volt lithium ion battery calibration steps (see patient handbook section 3).            X   Reviewed contents of black bag and what must be with patient at all times.            X  Reviewed driveline exit site including cleansing, dressing, and immobilizing with an anchor device to prevent exit site trauma.             X  Reviewed weekly maintenance which includes: Reviewing "Replacing the [place] with a [employer]" pamphlet. Clean batteries, clips, and battery charger  contacts.  Check cables for damages. Rotate batteries; keep all 8 charged.              X  Reviewed monthly maintenance which includes: Reviewing Alarms and Troubleshooting. Check battery manufacturer dates. Check use/charge cycles for each battery; remember to re-calibrate every 70 uses when prompted.              X  Additional pamphlets Guide to Replacing the [place] with the [place] for Patients and Their Caregivers and HM II Alarms for Patients and Their Caregivers given to patient.              X   Batteries Manufacture Date: Number of uses: Re-calibration  January/2014 @ 70 Performed by patient   Annual maintenance completed per [employer] on patient's home power module and universal Char[employer] fundraiser on 11/08/14.  Backup system controller 11 volt battery charged during today's visit.   1 year [place]acs follow up completed including:  Quality of Life, KCCQ-12, and Neurocognitive trail making completed on 09/11/14. Pt walked 1600 feet completing 6 min walk with S. Dixon, [place] Coordinator.

## 2014-11-19 NOTE — Progress Notes (Addendum)
Patient ID: Johnny Oneill, male   DOB: [birth year], 74 y.o.   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   Admitted 3/4-3/25/15 with SOB and syncopal episode. Found to have SBO and NG placed and started on TPN. On 11/06/13 developed progressive SOB and hypotension and co-ox 51% and Hgb 6.7. Started on milrinone and transfused. Echo showed nl RV function. Patient had PEA arrest and was intubated and started on pressors. CXR revelaed a R hemothorax and a R CT placed. He required VATs  and evacuation of hemothorax and then tansferred to CIR.   Patient saw Dr. Rayann Heman 01/24/14 for device follow up. Per note: pt is stable and on an appropriate medical regimen. Normal ICD function. R waves have decreased since LVAD placement; CXR revealed stable lead position. He plans to follow clinically and try to avoid lead revision if possible.   Follow up for Heart Failure: Saw him about three weeks ago for volume overload lasix adjusted. Now taking lasix 40 mg about 2x/week. Feeling better but gets wiped out after taking lasix. Weight hovering 150-156. Feels good remains active Minimal edema. No chest pain, orthopnea or PND. Reports taking Coumadin as prescribed and adherence to anticoagulation based dietary restrictions.  Denies bright red blood per rectum or melena, no dark urine or hematuria.     ICD interrogated personally: Optivol ok.  Activity tolerance good. Brief (25 beat run of NSVT) - asx  Past Medical History  Diagnosis Date  . Ischemic cardiomyopathy     a. 10/09 Echo: Sev LV Dysfxn, inf/lat AK, Mod MR.  [name];  s/p Lat MI 2009 with occlusion of the LCX - treated with Promus stenting. Has total occlusion of the RCA.    . ICD (implantable cardiac defibrillator) in place     PROPHYLACTIC      medtronic  . RBBB   . 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 Hypertension   . PVC (premature ventricular contraction)   . Pacemaker   . CHF (congestive heart failure)   . Shortness of breath   . Automatic implantable cardioverter-defibrillator in situ     Current Outpatient Prescriptions  Medication Sig Dispense Refill  . aspirin EC 325 MG tablet Take 325 mg by mouth daily.    . citalopram (CELEXA) 10 MG tablet Take 1 tablet (10 mg total) by mouth daily. 90 tablet 2  . Coenzyme Q10 (COQ10) 200 MG CAPS Take 200 mg by mouth daily.    [name] ezetimibe (ZETIA) 10 MG tablet Take 1 tablet (10 mg total) by mouth daily. 90 tablet 3  . ferrous Q000111Q C-folic acid (TRINSICON / FOLTRIN) capsule Take 1 capsule by mouth daily. 30 capsule 6  . hydrALAZINE (APRESOLINE) 25 MG tablet Take 3 tablets (75 mg total) by mouth 3 (three) times daily. 270 tablet 11  . levothyroxine (SYNTHROID, LEVOTHROID) 175 MCG tablet TAKE ONE TABLET BY MOUTH ONCE DAILY BEFORE BREAKFAST (Patient taking differently: TAKE ONE TABLET daily except 1.5 tab one day week) 90 tablet 1  . pantoprazole (PROTONIX) 40 MG tablet TAKE ONE TABLET BY MOUTH ONCE DAILY AT  NOON 30 tablet 3  . potassium chloride  SA (K-DUR,KLOR-CON) 20 MEQ tablet Take 1 tablet (20 mEq total) by mouth 2 (two) times a week. Take one potassium Monday and Friday only when you take your lasix 90 tablet 3  . rosuvastatin (CRESTOR) 20 MG tablet Take 1 tablet (20 mg total) by mouth daily. 90 tablet 3  . spironolactone (ALDACTONE) 25 MG tablet Take 1 tablet (25 mg total) by mouth daily. 90 tablet 3  . temazepam (RESTORIL) 15 MG capsule Take 1 capsule (15 mg total) by mouth at bedtime. 30 capsule 5  . furosemide (LASIX) 20 MG tablet Take 1 tablet (20 mg total) by mouth as needed. Take 20 mg as needed for wt > 153 lbs 30 tablet 6  . warfarin (COUMADIN) 5 MG tablet Take 7.5 mg daily except for  Monday/Wednesday/Friday take 5 mg. (Patient not taking: Reported on 11/19/2014) 60 tablet 6   No current facility-administered medications for this encounter.   Facility-Administered Medications Ordered in Other Encounters  Medication Dose Route Frequency Provider Last Rate Last Dose  . etomidate (AMIDATE) injection    Anesthesia Intra-op Sammuel Cooper Mumm, CRNA   20 mg at 02/08/14 0915  . rocuronium [facility]) injection    Anesthesia Intra-op Sammuel Cooper Mumm, CRNA   50 mg at 02/08/14 0932  . succinylcholine (ANECTINE) injection    Anesthesia Intra-op Sammuel Cooper Mumm, CRNA   100 mg at 02/08/14 0915    Ace inhibitors; Diovan; Lipitor; and Norvasc  REVIEW OF SYSTEMS: All systems negative except as listed in HPI, PMH and Problem list.  LVAD interrogation reveals:  Speed: 9200 Flow: 4.6 Power: 5.3 PI: 7.5 Alarms: rare low flow advisories Events: rare PI events - had several PI events on the night of 3/17 after taking lasix 40 that morning at 10.  Fixed speed: 9200 Low speed limit: 8600 Primary Controller: Replace back up battery in 19 months. (April 2017) Back up controller: Replace back up battery in 19 months. (April 2017)  I reviewed the LVAD parameters from today, and compared the results to the patient's prior recorded data.  No programming changes were made.  The LVAD is functioning within specified parameters.  The patient performs LVAD self-test daily.  LVAD interrogation was negative for any significant power changes, alarms or PI events/speed drops.  LVAD equipment check completed and is in good working order.  Back-up equipment present.   LVAD education done on emergency procedures and precautions and reviewed exit site care.    Filed Vitals:   11/19/14 1130  BP: 104/0  Pulse: 84  Height: 5\' 9"  (1.753 m)  Weight: 156 lb 6.4 oz (70.943 kg)  SpO2: 98%   HR 84   O2Sats 96%  Physical Exam: GENERAL: Well appearing, male who presents to clinic today in no acute distress;  Wife present HEENT: normal  NECK: Supple, JVP  ~7-8  no bruits. Prominent CV waves  No lymphadenopathy or thyromegaly appreciated.    CARDIAC:  Mechanical heart sounds with LVAD hum present. Irregular heart sounds LUNGS:  Clear to auscultation bilaterally.  ABDOMEN:  Soft, round, nontender, positive bowel sounds x4. Mild distension. nontender LVAD exit site: well-healed and semi incorporated.  Dressing dry and intact.  No erythema or drainage.  Stabilization device present and accurately applied.  Driveline dressing is being changed weekly per sterile technique.Dry scales noted around driveline .  EXTREMITIES:  Warm and dry, no cyanosis, clubbing, rash.  Rand LLE trace edema.  NEUROLOGIC:  Alert and oriented x 4.  Gait steady.  No aphasia.  No dysarthria.  Affect pleasant.     ASSESSMENT AND PLAN:   1) Chronic systolic HF: ICM, EF 0000000 s/p LVAD HM II implant 08/2013 for DT  - NYHA II symptoms. Volume status ok - maybe a bit on the dry side. Decrease lasix to 20mg  as needed for weight 156 or greater.  -Continue spironolactone to 25 mg daily.  - Not on BB with history of RV failure.  - MAP good. Continue current regimen.  - He has not tolerated ACE-I or norvasc in the past. 2) LVAD: HMII 08/2013 for DT - Doing well. LVAD parameters stable. - Check BMET, CBC, pro-BNP, LDH and INR. - Last RAMP Echo 12/2013 will plan to repeat 12/2014 3) Chronic anticoagulation: -  INR goal 2.0-3.0. No bleeding issues. Check INR and adjust accordingly by HF pharmacy. Continue ASA 325 mg for LVAD. 4) HTN:  - Ok.  5) Hypothyroidism: Per Dr Dwyane Dee. Synthroid recently increased.  6) PVCs and PAF: In sinus now. May be related to hypokalemia or suction events. Supp K+. Continue magnesium. Check CXR shows good VAD angulation 7) NSVT: ICD interrogated personally with rep. Brief. Continue to follow. Check K and mag. Can consider changing VT therapy zone to 220.   F/U 2 months  Benay Spice  11/19/2014

## 2014-11-19 NOTE — Patient Instructions (Addendum)
1.  Increase coumadin to 7.5 mg daily; re-check INR next Tues. 2.  Change Lasix dose to 20 mg as needed for weight > 153 lbs 3.  Return to [place] clinic 6 weeks.

## 2014-11-19 NOTE — Addendum Note (Signed)
Encounter addended by: Jolaine Artist, MD on: 11/19/2014  3:58 PM<BR>     Documentation filed: Clinical Notes, Dx Association, Visit Diagnoses, Charges VN, Follow-up Section, LOS Section, Orders

## 2014-11-21 ENCOUNTER — Encounter (HOSPITAL_COMMUNITY): Payer: Medicare Other

## 2014-11-27 ENCOUNTER — Other Ambulatory Visit (INDEPENDENT_AMBULATORY_CARE_PROVIDER_SITE_OTHER): Payer: Medicare Other | Admitting: *Deleted

## 2014-11-27 ENCOUNTER — Ambulatory Visit (HOSPITAL_COMMUNITY): Payer: Self-pay | Admitting: *Deleted

## 2014-11-27 DIAGNOSIS — Z7901 Long term (current) use of anticoagulants: Secondary | ICD-10-CM

## 2014-11-27 DIAGNOSIS — Z95811 Presence of heart assist device: Secondary | ICD-10-CM

## 2014-11-27 LAB — PROTIME-INR
INR: 2.2 ratio — ABNORMAL HIGH (ref 0.8–1.0)
Prothrombin Time: 23.5 s — ABNORMAL HIGH (ref 9.6–13.1)

## 2014-12-13 ENCOUNTER — Other Ambulatory Visit (INDEPENDENT_AMBULATORY_CARE_PROVIDER_SITE_OTHER): Payer: Medicare Other | Admitting: *Deleted

## 2014-12-13 ENCOUNTER — Ambulatory Visit (HOSPITAL_COMMUNITY): Payer: Self-pay | Admitting: *Deleted

## 2014-12-13 DIAGNOSIS — Z7901 Long term (current) use of anticoagulants: Secondary | ICD-10-CM

## 2014-12-13 DIAGNOSIS — Z95811 Presence of heart assist device: Secondary | ICD-10-CM

## 2014-12-13 LAB — PROTIME-INR
INR: 2.1 ratio — ABNORMAL HIGH (ref 0.8–1.0)
Prothrombin Time: 23.2 s — ABNORMAL HIGH (ref 9.6–13.1)

## 2014-12-20 ENCOUNTER — Ambulatory Visit (INDEPENDENT_AMBULATORY_CARE_PROVIDER_SITE_OTHER): Payer: Medicare Other | Admitting: *Deleted

## 2014-12-20 ENCOUNTER — Telehealth: Payer: Self-pay | Admitting: Cardiology

## 2014-12-20 DIAGNOSIS — I255 Ischemic cardiomyopathy: Secondary | ICD-10-CM

## 2014-12-20 DIAGNOSIS — I5022 Chronic systolic (congestive) heart failure: Secondary | ICD-10-CM | POA: Diagnosis not present

## 2014-12-20 NOTE — Telephone Encounter (Signed)
LMOVM reminding pt to send remote transmission.   

## 2014-12-21 NOTE — Progress Notes (Signed)
Remote ICD transmission.   

## 2014-12-24 ENCOUNTER — Other Ambulatory Visit (HOSPITAL_COMMUNITY): Payer: Self-pay | Admitting: *Deleted

## 2014-12-24 ENCOUNTER — Other Ambulatory Visit: Payer: Self-pay | Admitting: *Deleted

## 2014-12-24 DIAGNOSIS — F329 Major depressive disorder, single episode, unspecified: Secondary | ICD-10-CM

## 2014-12-24 DIAGNOSIS — F32A Depression, unspecified: Secondary | ICD-10-CM

## 2014-12-24 MED ORDER — PANTOPRAZOLE SODIUM 40 MG PO TBEC: 40.0000 mg | DELAYED_RELEASE_TABLET | Freq: Every day | ORAL | Status: DC

## 2014-12-25 ENCOUNTER — Other Ambulatory Visit (HOSPITAL_COMMUNITY): Payer: Self-pay | Admitting: *Deleted

## 2014-12-25 DIAGNOSIS — F32A Depression, unspecified: Secondary | ICD-10-CM

## 2014-12-25 DIAGNOSIS — F329 Major depressive disorder, single episode, unspecified: Secondary | ICD-10-CM

## 2014-12-25 MED ORDER — CITALOPRAM HYDROBROMIDE 10 MG PO TABS: 10.0000 mg | ORAL_TABLET | Freq: Every day | ORAL | Status: DC

## 2014-12-26 ENCOUNTER — Ambulatory Visit (HOSPITAL_COMMUNITY)
Admission: RE | Admit: 2014-12-26 | Discharge: 2014-12-26 | Disposition: A | Discharge status: Home / Self Care | Payer: Medicare Other | Source: Ambulatory Visit | Attending: Internal Medicine | Admitting: Internal Medicine

## 2014-12-26 DIAGNOSIS — B999 Unspecified infectious disease: Secondary | ICD-10-CM

## 2014-12-26 MED ORDER — DOXYCYCLINE HYCLATE 100 MG PO CAPS: 100.0000 mg | ORAL_CAPSULE | Freq: Two times a day (BID) | ORAL | Status: DC

## 2014-12-26 NOTE — Progress Notes (Signed)
Wife called to report pt has new onset redness and drainage from LVAD exit site. Pt seen in clinic for wound check.  Pt reports he bent over to tie his shoe and his controller "hit" his exit and it "hurt".  Wife reports he had drainage with no odor, but site is very reddened. Darrick Grinder, NP in to assess - Rx for 7 day course of doxy given. If drainage continues, instructed to increase to daily dressing changes (kits provided).  Instructed to call if symptoms worsen or do not improve. Pt and wife verbalized understanding of same.

## 2014-12-26 NOTE — Addendum Note (Signed)
Encounter addended by: Lezlie Octave, RN on: 12/26/2014 12:48 PM<BR>     Documentation filed: Notes Section

## 2014-12-28 NOTE — Addendum Note (Signed)
Encounter addended by: Lezlie Octave, RN on: 12/28/2014  1:44 PM<BR>     Documentation filed: Notes Section

## 2014-12-31 ENCOUNTER — Other Ambulatory Visit (HOSPITAL_COMMUNITY): Payer: Self-pay | Admitting: Infectious Diseases

## 2014-12-31 ENCOUNTER — Ambulatory Visit (HOSPITAL_COMMUNITY)
Admission: RE | Admit: 2014-12-31 | Discharge: 2014-12-31 | Disposition: A | Discharge status: Home / Self Care | Payer: Medicare Other | Source: Ambulatory Visit | Attending: Adult Health | Admitting: Adult Health

## 2014-12-31 DIAGNOSIS — B369 Superficial mycosis, unspecified: Secondary | ICD-10-CM

## 2014-12-31 DIAGNOSIS — B49 Unspecified mycosis: Secondary | ICD-10-CM

## 2014-12-31 MED ORDER — FLUCONAZOLE 100 MG PO TABS: 100.0000 mg | ORAL_TABLET | Freq: Once | ORAL | Status: DC

## 2014-12-31 NOTE — Patient Instructions (Signed)
1. Stop taking your doxycycline 2. Take 200 mg (2 pills) of Diflucan today--this will interact with your coumadin so we will need to check your INR Wednesday this week and eary next week (will talk about this specifically Wednesday) 3. Apply Miconazole powder once a day to the site: you will do a light dusting of the powder, then paint the skin protectant over it for a total of 3 times.  4. No showering for now and keep your site very dry. 5. Continue daily dressing changes and use the silver strips for now.  6. As your site heals it will fade and flake off--this is normal.

## 2014-12-31 NOTE — Addendum Note (Signed)
Addended by: Merrill Callas on: 12/31/2014 12:11 PM   Modules accepted: Orders

## 2014-12-31 NOTE — Progress Notes (Signed)
Called to evaluate patient's drive line exit site by patient's wife Sunday afternoon.    Presents with an itchy, red patchy area with satellite lesions that was determined to be fungal. Minimal honey colored crusts on silver. Instructed patient to stop taking doxycycline, start Diflucan 200mg  x 1 dose today (instructed per Darrick Grinder, NP). Provided daily dressing kits and silver aquacel strips to perform daily care the rest of this week at least. Microfungin powder ordered and instructed to lightly dust site, paint with skin prep 3 times, use aquacel strip and continue daily care until clinic visit Wednesday. Will check INR Wednesday since it will interact with his warfarin. Discussed with patient importance of keeping site dry, no showering at this time until able to progress back to weekly dressings, and stay out of the heat in cool dry areas to limit sweating. Patient his wife verbalize understanding.   Site was cleaned with sterile water wipes (CHG allergy), microfungin powder applied as described above and new dressing applied. Instructed to call with any changes. F/U in clinic on Wednesday.

## 2015-01-02 ENCOUNTER — Other Ambulatory Visit: Payer: Medicare Other

## 2015-01-02 ENCOUNTER — Encounter (HOSPITAL_COMMUNITY): Payer: Self-pay

## 2015-01-02 ENCOUNTER — Other Ambulatory Visit (HOSPITAL_COMMUNITY): Payer: Self-pay | Admitting: Infectious Diseases

## 2015-01-02 ENCOUNTER — Other Ambulatory Visit: Payer: Self-pay | Admitting: Infectious Diseases

## 2015-01-02 ENCOUNTER — Ambulatory Visit (HOSPITAL_COMMUNITY)
Admission: RE | Admit: 2015-01-02 | Discharge: 2015-01-02 | Disposition: A | Discharge status: Home / Self Care | Payer: Medicare Other | Source: Ambulatory Visit | Attending: Cardiology | Admitting: Cardiology

## 2015-01-02 ENCOUNTER — Ambulatory Visit (HOSPITAL_COMMUNITY): Payer: Self-pay | Admitting: Infectious Diseases

## 2015-01-02 VITALS — BP 90/0 | HR 86 | Wt 156.4 lb

## 2015-01-02 DIAGNOSIS — D649 Anemia, unspecified: Secondary | ICD-10-CM | POA: Diagnosis not present

## 2015-01-02 DIAGNOSIS — Z95811 Presence of heart assist device: Secondary | ICD-10-CM | POA: Diagnosis not present

## 2015-01-02 DIAGNOSIS — E890 Postprocedural hypothyroidism: Secondary | ICD-10-CM

## 2015-01-02 DIAGNOSIS — I5022 Chronic systolic (congestive) heart failure: Secondary | ICD-10-CM

## 2015-01-02 DIAGNOSIS — E039 Hypothyroidism, unspecified: Secondary | ICD-10-CM | POA: Insufficient documentation

## 2015-01-02 DIAGNOSIS — Z7901 Long term (current) use of anticoagulants: Secondary | ICD-10-CM

## 2015-01-02 DIAGNOSIS — I2510 Atherosclerotic heart disease of native coronary artery without angina pectoris: Secondary | ICD-10-CM

## 2015-01-02 DIAGNOSIS — E876 Hypokalemia: Secondary | ICD-10-CM

## 2015-01-02 LAB — TSH: TSH: 0.118 u[IU]/mL — ABNORMAL LOW (ref 0.350–4.500)

## 2015-01-02 LAB — PROTIME-INR
INR: 2.56 — ABNORMAL HIGH (ref 0.00–1.49)
Prothrombin Time: 27.7 seconds — ABNORMAL HIGH (ref 11.6–15.2)

## 2015-01-02 LAB — BASIC METABOLIC PANEL
Anion gap: 11 (ref 5–15)
BUN: 18 mg/dL (ref 6–20)
CO2: 23 mmol/L (ref 22–32)
Calcium: 9.4 mg/dL (ref 8.9–10.3)
Chloride: 103 mmol/L (ref 101–111)
Creatinine, Ser: 1.25 mg/dL — ABNORMAL HIGH (ref 0.61–1.24)
GFR calc Af Amer: >60 mL/min (ref >60)
GFR calc non Af Amer: 56 mL/min — ABNORMAL LOW (ref >60)
Glucose, Bld: 178 mg/dL — ABNORMAL HIGH (ref 70–99)
Potassium: 3.7 mmol/L (ref 3.5–5.1)
Sodium: 137 mmol/L (ref 135–145)

## 2015-01-02 LAB — CBC
HCT: 40.2 % (ref 39.0–52.0)
Hemoglobin: 13.4 g/dL (ref 13.0–17.0)
MCH: 30.8 pg (ref 26.0–34.0)
MCHC: 33.3 g/dL (ref 30.0–36.0)
MCV: 92.4 fL (ref 78.0–100.0)
Platelets: 134 10*3/uL — ABNORMAL LOW (ref 150–400)
RBC: 4.35 MIL/uL (ref 4.22–5.81)
RDW: 14.3 % (ref 11.5–15.5)
WBC: 4.7 10*3/uL (ref 4.0–10.5)

## 2015-01-02 LAB — LACTATE DEHYDROGENASE: LDH: 182 U/L (ref 98–192)

## 2015-01-02 MED ORDER — LEVOTHYROXINE SODIUM 175 MCG PO TABS: ORAL_TABLET | ORAL | Status: DC

## 2015-01-02 MED ORDER — FE FUMARATE-B12-VIT C-FA-IFC PO CAPS: 1.0000 | ORAL_CAPSULE | Freq: Every day | ORAL | Status: DC

## 2015-01-02 MED ORDER — POTASSIUM CHLORIDE CRYS ER 20 MEQ PO TBCR: 20.0000 meq | EXTENDED_RELEASE_TABLET | Freq: Every day | ORAL | Status: DC | PRN

## 2015-01-02 NOTE — Progress Notes (Deleted)
Patient ID: Johnny Oneill, male   DOB: October 14, 1940, 74 y.o.   MRN: [id number]

## 2015-01-02 NOTE — Patient Instructions (Addendum)
1. Stop the Diflucan for now.  2. Daily dressing changes can be done every other day. Continue with the silver ribbons and antifungal powder.  3. Your INR is 2.56 today. We will recheck your INR Monday 01/07/15. No changes in your dose for now. If the pharmacist reconsiders, I will call you back.  4. No changes in your medications today.  5. I will schedule your echo with your next VAD clinic visit.  6. Follow up with the VAD clinic in 2 months but come by Tuesday for 01/07/15 for [place] to check your site at 1000.  7. Next INR 01/14/15

## 2015-01-02 NOTE — Progress Notes (Addendum)
Johnny Oneill comes to clinic today with his wife for a 2 month visit.    Symptom Yes No Details  Angina  x Activity:  Claudication  x How far:  Syncope  x When:  Stroke  x   Orthopnea  x How many pillows:  PND  x How often:  CPAP  n/a How many hrs:  Pedal edema  x   Abd fullness  x   N&V  x   Diaphoresis  x When:  Bleeding  x Dark stools--on Iron   Urine  x Clear yellow to ginger ale colored  SOB  x Activity:  Palpitations x  When: occasionally feels them throughout the day but they do not bother him (high PVC burden).   ICD shock  x   Hospitlizaitons  x When/where/why:  ED visit  x When/where/why:  Other MD x  When/who/why:Dr. Dwyane Dee (endocrine) TSH dose increased in 10/2014.   Activity  x Tolerating normal activity   Fluid  x  about 2 L   Diet  x  Good appetite--eating better.    BP: 90 doppler  Weight: 156 lb Last Weight: 156 lb HR:  86 SPO2: 99%  VAD interrogation revealed: Speed:  9200 Flow:  5.7 Power: 5.8 PI: 6.8 Alarms: few low voltage alarms   Events:  rare PI events at various times throughout the days and do not occur daily. Up to 3 in a 24 h period.  Fixed speed: 9200 Low speed limit: 8600 Primary Controller:  Replace back up battery in  __17___  Months. (April 2017) Back up controller:   Replace back up battery in  __17___  Months. (April 2017)  I reviewed the LVAD parameters from today, and compared the results to the patient's prior recorded data.  No programming changes were made.  The LVAD is functioning within specified parameters.  The patient performs LVAD self-test daily.  LVAD interrogation shows rare PI events at various times throughout the days and do not occur daily with up to 3 in 24 h. No speed drops.  LVAD equipment check completed and is in good working order.  Back-up equipment present.  I reviewed daily logs from home, he is taking lasix appropriately when his weight increases beyond 153 lb. Doing well overall and has no complaints.   He was  given a one time dose of Fluconazole 200 mg with extra pills incase we needed to continue the dose after the reassessment today--He reports that he took 200 mg x1 then has taken 100 mg QD x 2. Site looks much better and redness is decreased. Had some crusts on the site that were bleeding a little after cleansing it gently with saline wipe.   D/W Amy Clegg. Reinforced to stop the Fluconazole, continue with the antifungal powder as previously discussed, and can decrease the dressing changes to every other day. Back Tuesday 01/08/15 for a dressing change and site assessment.     Optivol completed per Dr. Claris Gladden request which was satisfactory. TSH/T4 obtained d/t previous dose adjust for Levothyroxine 8w ago. They increased his dose--today he reports that he has felt hungrier lately. TSH low. Will forward results to his endocrinologist for management.   F/U in VAD clinic 2 months. He needs his annual echocardiogram with next clinic visit. Will obtain precert if necessary and schedule with his next visit. D/W Dr. Aundra Dubin. Next visit is 18 mo INTERMACs.  Janene Madeira, RN VAD Coordinator   Office: [phone] 24/7 VAD Pager: [phone]  Patient seen  with VAD nurse, agree with the above note.   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   Admitted 3/4-3/25/15 with SOB and syncopal episode. Found to have SBO and NG placed and started on TPN. On 11/06/13 developed progressive SOB and hypotension and co-ox 51% and Hgb 6.7. Started on milrinone and transfused. Echo showed nl RV function. Patient had PEA arrest and was intubated and started on pressors. CXR revelaed a R hemothorax and a R CT placed. He required VATs  and evacuation of hemothorax and then tansferred to CIR.   Patient saw Dr. Rayann Heman 01/24/14 for device follow up. Per note: pt is stable and on an appropriate medical regimen.  Normal ICD function. R waves have decreased since LVAD placement; CXR revealed stable lead position. He plans to follow clinically and try to avoid lead revision if possible.  Follow up for Heart Failure: He is doing well generally.  No significant dyspnea.  He takes Lasix about 1-2 times a month for weight > 153 lbs at home. No chest pain, orthopnea or PND. Reports taking Coumadin as prescribed and adherence to anticoagulation based dietary restrictions.  Denies bright red blood per rectum or melena, no dark urine or hematuria.   He has had Diflucan for yeast infection around driveline site.  LVAD parameters stable with 1-3 PI events/day.   Optivol assess today: Fluid index < threshold, stable thoracic impedance.   Past Medical History  Diagnosis Date  . Ischemic cardiomyopathy     a. 10/09 Echo: Sev LV Dysfxn, inf/lat AK, Mod MR.  [name];  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 Hypertension   . PVC (premature ventricular contraction)   . Pacemaker   . CHF (congestive heart failure)   . Shortness of breath   . Automatic implantable cardioverter-defibrillator in situ     Current Outpatient Prescriptions  Medication Sig Dispense Refill  . aspirin EC 325 MG tablet Take 325 mg by mouth daily.    . citalopram (CELEXA) 10 MG tablet Take 1 tablet (10 mg total) by mouth daily. 90 tablet 2  . Coenzyme Q10 (COQ10) 200 MG CAPS Take 200 mg by mouth daily.    [name] ezetimibe (ZETIA) 10 MG tablet Take 1 tablet (10 mg total) by mouth daily. 90 tablet 3  . ferrous Q000111Q C-folic acid (TRINSICON / FOLTRIN) capsule Take 1 capsule by mouth daily. 90  capsule 3  . fluconazole (DIFLUCAN) 100 MG tablet Take 1 tablet (100 mg total) by mouth once. 12 tablet 0  . furosemide (LASIX) 20 MG tablet Take 1 tablet (20 mg total) by mouth as needed. Take 20 mg as needed for wt > 153 lbs 30 tablet 6  . hydrALAZINE (APRESOLINE) 25 MG tablet Take 3 tablets (75 mg total) by mouth 3 (three) times daily. 270 tablet 11  . levothyroxine (SYNTHROID, LEVOTHROID) 175 MCG tablet TAKE ONE TABLET daily except 1.5  tab one day week 90 tablet 1  . pantoprazole (PROTONIX) 40 MG tablet Take 1 tablet (40 mg total) by mouth daily. 30 tablet 0  . potassium chloride SA (K-DUR,KLOR-CON) 20 MEQ tablet Take 1 tablet (20 mEq total) by mouth daily as needed. Take one potassium pill when you take your lasix. 90 tablet 3  . rosuvastatin (CRESTOR) 20 MG tablet Take 1 tablet (20 mg total) by mouth daily. 90 tablet 3  . spironolactone (ALDACTONE) 25 MG tablet Take 1 tablet (25 mg total) by mouth daily. 90 tablet 3  . temazepam (RESTORIL) 15 MG capsule Take 1 capsule (15 mg total) by mouth at bedtime. 30 capsule 5  . warfarin (COUMADIN) 5 MG tablet Take 7.5 mg daily except for Monday/Wednesday/Friday take 5 mg. 60 tablet 6   No current facility-administered medications for this encounter.   Facility-Administered Medications Ordered in Other Encounters  Medication Dose Route Frequency Provider Last Rate Last Dose  . etomidate (AMIDATE) injection    Anesthesia Intra-op Sammuel Cooper Mumm, CRNA   20 mg at 02/08/14 0915  . rocuronium [facility]) injection    Anesthesia Intra-op Sammuel Cooper Mumm, CRNA   50 mg at 02/08/14 0932  . succinylcholine (ANECTINE) injection    Anesthesia Intra-op Sammuel Cooper Mumm, CRNA   100 mg at 02/08/14 0915    Ace inhibitors; Diovan; Lipitor; and Norvasc  REVIEW OF SYSTEMS: All systems negative except as listed in HPI, PMH and Problem list.  LVAD interrogation reveals:  See above  I reviewed the LVAD parameters from today, and compared the results to the patient's  prior recorded data.  No programming changes were made.  The LVAD is functioning within specified parameters.  The patient performs LVAD self-test daily.  LVAD interrogation was negative for any significant power changes, alarms or PI events/speed drops.  LVAD equipment check completed and is in good working order.  Back-up equipment present.   LVAD education done on emergency procedures and precautions and reviewed exit site care.    Filed Vitals:   01/02/15 1118  BP: 90/0  Pulse: 86  Weight: 156 lb 6.4 oz (70.943 kg)  SpO2: 99%   Physical Exam: GENERAL: Well appearing, male who presents to clinic today in no acute distress; Wife present HEENT: normal  NECK: Supple, JVP 8  no bruits. Prominent CV waves  No lymphadenopathy or thyromegaly appreciated.    CARDIAC:  Mechanical heart sounds with LVAD hum present. Irregular heart sounds LUNGS:  Clear to auscultation bilaterally.  ABDOMEN:  Soft, round, nontender, positive bowel sounds x4. Mild distension. nontender LVAD exit site: well-healed and semi incorporated.  Dressing dry and intact.  No erythema or drainage.  Stabilization device present and accurately applied.  Driveline dressing is being changed weekly per sterile technique.Dry scales noted around driveline .  EXTREMITIES:  Warm and dry, no cyanosis, clubbing, rash.  No edema.  NEUROLOGIC:  Alert and oriented x 4.  Gait steady.  No aphasia.  No dysarthria.  Affect pleasant.     ASSESSMENT AND PLAN:   1) Chronic systolic HF: ICM, EF 0000000 s/p LVAD HM II implant 08/2013 for DT. NYHA II symptoms. Volume status looks good.   - Continue Lasix prn weight > 153.  - Continue spironolactone to 25 mg daily.  - Not on BB with history of RV failure.  - MAP good. Continue current regimen.  - He has not tolerated ACE-I or amlodipine in the past. 2) LVAD: HMII 08/2013 for DT.  Doing well. LVAD  parameters stable. - Check BMET, CBC, pro-BNP, LDH and INR today. - Will set up for annual echo.  3)  Chronic anticoagulation: -  INR goal 2.0-3.0. No bleeding issues. Check INR and adjust accordingly by HF pharmacy.  - Continue ASA 325 mg for LVAD. 4) HTN: MAP acceptable. 5) Hypothyroidism: Per Dr Dwyane Dee.   F/U 2 months  Hend Mccarrell,MD  01/02/2015

## 2015-01-02 NOTE — Addendum Note (Signed)
Addended by: [place] Callas on: 01/02/2015 02:20 PM   Modules accepted: Orders, Level of Service

## 2015-01-03 LAB — T4: T4, Total: 9.9 ug/dL (ref 4.5–12.0)

## 2015-01-04 LAB — CUP PACEART REMOTE DEVICE CHECK
Battery Voltage: 2.64 V
Brady Statistic AP VP Percent: 0.13 %
Brady Statistic AP VS Percent: 25.03 %
Brady Statistic AS VP Percent: 0 %
Brady Statistic AS VS Percent: 74.84 %
Brady Statistic RA Percent Paced: 25.15 %
Brady Statistic RV Percent Paced: 0.13 %
Date Time Interrogation Session: 20160421191255
HighPow Impedance: 35 Ohm
HighPow Impedance: 43 Ohm
Lead Channel Impedance Value: 372 Ohm
Lead Channel Impedance Value: 392 Ohm
Lead Channel Sensing Intrinsic Amplitude: 2.513 mV
Lead Channel Sensing Intrinsic Amplitude: 2.662 mV
Lead Channel Setting Pacing Amplitude: 2 V
Lead Channel Setting Pacing Amplitude: 2.5 V
Lead Channel Setting Pacing Pulse Width: 1.5 ms
Lead Channel Setting Sensing Sensitivity: 0.3 mV
Zone Setting Detection Interval: 300 ms
Zone Setting Detection Interval: 350 ms
Zone Setting Detection Interval: 350 ms
Zone Setting Detection Interval: 350 ms

## 2015-01-08 ENCOUNTER — Other Ambulatory Visit (HOSPITAL_COMMUNITY): Payer: Self-pay | Admitting: *Deleted

## 2015-01-08 ENCOUNTER — Ambulatory Visit (HOSPITAL_COMMUNITY)
Admission: RE | Admit: 2015-01-08 | Discharge: 2015-01-08 | Disposition: A | Discharge status: Home / Self Care | Payer: Medicare Other | Source: Ambulatory Visit | Attending: Cardiology | Admitting: Cardiology

## 2015-01-08 ENCOUNTER — Ambulatory Visit (HOSPITAL_COMMUNITY): Payer: Self-pay | Admitting: *Deleted

## 2015-01-08 DIAGNOSIS — Z7901 Long term (current) use of anticoagulants: Secondary | ICD-10-CM | POA: Insufficient documentation

## 2015-01-08 DIAGNOSIS — Z95811 Presence of heart assist device: Secondary | ICD-10-CM

## 2015-01-08 DIAGNOSIS — D649 Anemia, unspecified: Secondary | ICD-10-CM

## 2015-01-08 DIAGNOSIS — I5022 Chronic systolic (congestive) heart failure: Secondary | ICD-10-CM

## 2015-01-08 DIAGNOSIS — Z5181 Encounter for therapeutic drug level monitoring: Secondary | ICD-10-CM | POA: Diagnosis not present

## 2015-01-08 LAB — PROTIME-INR
INR: 3.59 — ABNORMAL HIGH (ref 0.00–1.49)
Prothrombin Time: 36.1 seconds — ABNORMAL HIGH (ref 11.6–15.2)

## 2015-01-08 MED ORDER — WARFARIN SODIUM 5 MG PO TABS: ORAL_TABLET | ORAL | Status: DC

## 2015-01-08 NOTE — Progress Notes (Signed)
Pt presented to clinic for dressing change and wound check with INR.   LVAD Exit Site:  VAD dressing removed and site care performed using sterile technique. Exit site well healed and incorporated. The velour is fully implanted at exit site. Less redness, no tenderness, drainage, or foul odor noted; powder noted around exit site. Wife is performing every other day dressing changes. Drive line anchor attached properly, driveline secured. Pt denies fever or chills. Driveline dressing is being changed every other day using guaze dressing with aquacel strips around exit site. Asked wife to stop the powder due to decreased redness around site.

## 2015-01-10 ENCOUNTER — Encounter: Payer: Self-pay | Admitting: Cardiology

## 2015-01-14 ENCOUNTER — Other Ambulatory Visit (HOSPITAL_COMMUNITY): Payer: Self-pay | Admitting: *Deleted

## 2015-01-14 ENCOUNTER — Other Ambulatory Visit (INDEPENDENT_AMBULATORY_CARE_PROVIDER_SITE_OTHER): Payer: Medicare Other | Admitting: *Deleted

## 2015-01-14 ENCOUNTER — Ambulatory Visit (HOSPITAL_COMMUNITY): Payer: Self-pay | Admitting: *Deleted

## 2015-01-14 DIAGNOSIS — Z7901 Long term (current) use of anticoagulants: Secondary | ICD-10-CM

## 2015-01-14 DIAGNOSIS — Z95811 Presence of heart assist device: Secondary | ICD-10-CM

## 2015-01-14 LAB — PROTIME-INR
INR: 2.8 ratio — ABNORMAL HIGH (ref 0.8–1.0)
Prothrombin Time: 30 s — ABNORMAL HIGH (ref 9.6–13.1)

## 2015-01-15 ENCOUNTER — Other Ambulatory Visit: Payer: Self-pay | Admitting: Gastroenterology

## 2015-01-15 DIAGNOSIS — Z139 Encounter for screening, unspecified: Secondary | ICD-10-CM

## 2015-01-15 DIAGNOSIS — R5381 Other malaise: Secondary | ICD-10-CM

## 2015-01-16 ENCOUNTER — Encounter: Payer: Self-pay | Admitting: Internal Medicine

## 2015-01-16 ENCOUNTER — Ambulatory Visit (HOSPITAL_COMMUNITY)
Admission: RE | Admit: 2015-01-16 | Discharge: 2015-01-16 | Disposition: A | Discharge status: Home / Self Care | Payer: Medicare Other | Source: Ambulatory Visit | Attending: Internal Medicine | Admitting: Internal Medicine

## 2015-01-16 DIAGNOSIS — Z872 Personal history of diseases of the skin and subcutaneous tissue: Secondary | ICD-10-CM | POA: Diagnosis not present

## 2015-01-16 DIAGNOSIS — I5022 Chronic systolic (congestive) heart failure: Secondary | ICD-10-CM

## 2015-01-16 DIAGNOSIS — Z8619 Personal history of other infectious and parasitic diseases: Secondary | ICD-10-CM

## 2015-01-16 NOTE — Progress Notes (Signed)
Pt presented to clinic for dressing change. Completed 3 days of Fluconazole for fungal rash at the exit site.   LVAD Exit Site:  VAD dressing removed and site care performed using sterile technique. Exit site well healed and incorporated. The velour is fully implanted at exit site. Redness has faded to pink, no tenderness, drainage, or foul odor noted. Powder application was stopped last visit. Still doing every other day dressing changes. No drainage on the silver strip this change. Small crust removed around DL. He is hopeful to be back on weekly dressing changes in 2 weeks time for a trip. With how the site looks today and the improvements seen I advanced him to a weekly dressing with instructions that if he were to notice increased redness, started sweating a lot, noticed any moisture under the dressing or started itching at the site he should go back to the gauze and tape dressings. Back in 1 week for reassessment of his site to ensure weekly kits are still appropriate and well tolerated.

## 2015-01-22 ENCOUNTER — Encounter (HOSPITAL_COMMUNITY): Payer: Self-pay | Admitting: Infectious Diseases

## 2015-01-22 ENCOUNTER — Ambulatory Visit (HOSPITAL_COMMUNITY)
Admission: RE | Admit: 2015-01-22 | Discharge: 2015-01-22 | Disposition: A | Discharge status: Home / Self Care | Payer: Medicare Other | Source: Ambulatory Visit | Attending: Cardiology | Admitting: Cardiology

## 2015-01-22 DIAGNOSIS — Z7901 Long term (current) use of anticoagulants: Secondary | ICD-10-CM

## 2015-01-22 DIAGNOSIS — Z029 Encounter for administrative examinations, unspecified: Secondary | ICD-10-CM | POA: Insufficient documentation

## 2015-01-22 DIAGNOSIS — K219 Gastro-esophageal reflux disease without esophagitis: Secondary | ICD-10-CM

## 2015-01-22 MED ORDER — PANTOPRAZOLE SODIUM 40 MG PO TBEC: 40.0000 mg | DELAYED_RELEASE_TABLET | Freq: Every day | ORAL | Status: DC

## 2015-01-22 NOTE — Patient Instructions (Addendum)
1. Fluconazole (your antifungal medication) we will restart. 1 daily for 7 days.  2. Continue with the gauze dressings changed every 3 - 5 days (about 2x/week). Increase frequency of dressings if needed for drainage. Stop powder. Can shower if you change the dressing afterwards. Do not need to use the silver ribbons anymore since site is well healed and closed.  3. Coumadin 5 mg today 7.5 mg tomorrow and 5 mg Friday. New regimen while you are on Fluconazole  4. INR on Tuesday at [facility] please  5. Closest VAD Centers near you in [place] are the following centers:   [facility] [place], [place] [phone]  Get directions (3.63mi)   [facility], [place], [place] [phone]  Get directions (3.93mi)

## 2015-01-23 ENCOUNTER — Other Ambulatory Visit (HOSPITAL_COMMUNITY): Payer: Self-pay | Admitting: *Deleted

## 2015-01-25 NOTE — Progress Notes (Signed)
Pt presented to clinic for dressing change. Called yesterday reporting that fungal infection back.   VAD Exit Site: We tried to resume weekly dressing last visit and was unable to tolerate. 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 VAD occlusive dressing removed and site care performed using sterile technique. Exit site well healed and incorporated. Erythema and satellite lesions present without any drainage, or foul odor noted. Cleaned with sterile saline (CHG allergy), allowed to dry completely, placed daily gauze dressing on without silver (no drainage and completely healed) and encouraged that he should continue with the gauze dressings for now, especially since the summer heat is setting in and he will likely sweat more now and he can change it every 3 days or so. Instructed that if it becomes wet or drainage becomes apparent that he is to notify us, resume silver Aquacel and change daily. Verbalized understanding. Discussed not applying the powder at this time. Amy prescribed him a longer course of Fluconazole this time. Back for INR check early next week.      Also informed me of his upcoming travel plans. Provided with TSA letter and closest VAD center information.

## 2015-01-25 NOTE — Addendum Note (Signed)
Encounter addended by: Foreman Callas, RN on: 01/25/2015 12:31 PM<BR>     Documentation filed: Notes Section, Chief Complaint Section, Episodes

## 2015-01-29 ENCOUNTER — Ambulatory Visit (HOSPITAL_COMMUNITY): Payer: Self-pay | Admitting: *Deleted

## 2015-01-29 ENCOUNTER — Other Ambulatory Visit: Payer: Medicare Other | Admitting: *Deleted

## 2015-01-29 ENCOUNTER — Other Ambulatory Visit (INDEPENDENT_AMBULATORY_CARE_PROVIDER_SITE_OTHER): Payer: Medicare Other | Admitting: *Deleted

## 2015-01-29 ENCOUNTER — Telehealth (HOSPITAL_COMMUNITY): Payer: Self-pay | Admitting: *Deleted

## 2015-01-29 ENCOUNTER — Other Ambulatory Visit (HOSPITAL_COMMUNITY): Payer: Self-pay | Admitting: *Deleted

## 2015-01-29 DIAGNOSIS — Z7901 Long term (current) use of anticoagulants: Secondary | ICD-10-CM

## 2015-01-29 DIAGNOSIS — Z95811 Presence of heart assist device: Secondary | ICD-10-CM

## 2015-01-29 DIAGNOSIS — R791 Abnormal coagulation profile: Secondary | ICD-10-CM

## 2015-01-29 LAB — PROTIME-INR
INR: 4.6 ratio — ABNORMAL HIGH (ref 0.8–1.0)
Prothrombin Time: 48.8 s — ABNORMAL HIGH (ref 9.6–13.1)

## 2015-01-29 NOTE — Addendum Note (Signed)
Addended by: Eulis Foster on: 01/29/2015 11:21 AM   Modules accepted: Orders

## 2015-01-30 ENCOUNTER — Other Ambulatory Visit (HOSPITAL_COMMUNITY): Payer: Self-pay | Admitting: *Deleted

## 2015-01-30 DIAGNOSIS — K219 Gastro-esophageal reflux disease without esophagitis: Secondary | ICD-10-CM

## 2015-01-30 MED ORDER — PANTOPRAZOLE SODIUM 40 MG PO TBEC: 40.0000 mg | DELAYED_RELEASE_TABLET | Freq: Every day | ORAL | Status: DC

## 2015-02-01 ENCOUNTER — Encounter: Payer: Self-pay | Admitting: *Deleted

## 2015-02-01 ENCOUNTER — Ambulatory Visit (HOSPITAL_COMMUNITY): Payer: Self-pay | Admitting: *Deleted

## 2015-02-01 ENCOUNTER — Other Ambulatory Visit (INDEPENDENT_AMBULATORY_CARE_PROVIDER_SITE_OTHER): Payer: Medicare Other | Admitting: *Deleted

## 2015-02-01 DIAGNOSIS — Z95811 Presence of heart assist device: Secondary | ICD-10-CM

## 2015-02-01 DIAGNOSIS — R791 Abnormal coagulation profile: Secondary | ICD-10-CM | POA: Diagnosis not present

## 2015-02-01 DIAGNOSIS — Z7901 Long term (current) use of anticoagulants: Secondary | ICD-10-CM | POA: Diagnosis not present

## 2015-02-01 LAB — PROTIME-INR
INR: 2.5 ratio — ABNORMAL HIGH (ref 0.8–1.0)
Prothrombin Time: 27.5 s — ABNORMAL HIGH (ref 9.6–13.1)

## 2015-02-01 NOTE — Addendum Note (Signed)
Addended by: Eulis Foster on: 02/01/2015 09:20 AM   Modules accepted: Orders

## 2015-02-04 ENCOUNTER — Other Ambulatory Visit (HOSPITAL_COMMUNITY): Payer: Self-pay | Admitting: *Deleted

## 2015-02-04 DIAGNOSIS — E785 Hyperlipidemia, unspecified: Secondary | ICD-10-CM

## 2015-02-04 DIAGNOSIS — I5020 Unspecified systolic (congestive) heart failure: Secondary | ICD-10-CM

## 2015-02-04 MED ORDER — EZETIMIBE 10 MG PO TABS: 10.0000 mg | ORAL_TABLET | Freq: Every day | ORAL | Status: DC

## 2015-02-04 MED ORDER — ROSUVASTATIN CALCIUM 20 MG PO TABS: 20.0000 mg | ORAL_TABLET | Freq: Every day | ORAL | Status: DC

## 2015-02-06 ENCOUNTER — Other Ambulatory Visit (INDEPENDENT_AMBULATORY_CARE_PROVIDER_SITE_OTHER): Payer: Medicare Other | Admitting: *Deleted

## 2015-02-06 DIAGNOSIS — Z95811 Presence of heart assist device: Secondary | ICD-10-CM

## 2015-02-06 DIAGNOSIS — Z7901 Long term (current) use of anticoagulants: Secondary | ICD-10-CM

## 2015-02-06 LAB — PROTIME-INR
INR: 3.3 ratio — ABNORMAL HIGH (ref 0.8–1.0)
Prothrombin Time: 35.6 s — ABNORMAL HIGH (ref 9.6–13.1)

## 2015-02-07 ENCOUNTER — Ambulatory Visit (HOSPITAL_COMMUNITY): Payer: Self-pay | Admitting: *Deleted

## 2015-02-07 DIAGNOSIS — Z7901 Long term (current) use of anticoagulants: Secondary | ICD-10-CM

## 2015-02-07 DIAGNOSIS — Z95811 Presence of heart assist device: Secondary | ICD-10-CM

## 2015-02-07 NOTE — Addendum Note (Signed)
Addended by: Zada Girt B on: 02/07/2015 04:26 PM   Modules accepted: Level of Service

## 2015-02-20 ENCOUNTER — Ambulatory Visit (HOSPITAL_COMMUNITY): Payer: Self-pay | Admitting: Infectious Diseases

## 2015-02-20 ENCOUNTER — Other Ambulatory Visit (INDEPENDENT_AMBULATORY_CARE_PROVIDER_SITE_OTHER): Payer: Medicare Other | Admitting: *Deleted

## 2015-02-20 DIAGNOSIS — Z95811 Presence of heart assist device: Secondary | ICD-10-CM

## 2015-02-20 DIAGNOSIS — Z7901 Long term (current) use of anticoagulants: Secondary | ICD-10-CM | POA: Diagnosis not present

## 2015-02-20 LAB — PROTIME-INR
INR: 2.2 ratio — ABNORMAL HIGH (ref 0.8–1.0)
Prothrombin Time: 23.9 s — ABNORMAL HIGH (ref 9.6–13.1)

## 2015-02-20 NOTE — Addendum Note (Signed)
Addended by: Eulis Foster on: 02/20/2015 11:14 AM   Modules accepted: Orders

## 2015-02-26 ENCOUNTER — Other Ambulatory Visit: Payer: Self-pay | Admitting: *Deleted

## 2015-02-26 ENCOUNTER — Other Ambulatory Visit (INDEPENDENT_AMBULATORY_CARE_PROVIDER_SITE_OTHER): Payer: Medicare Other

## 2015-02-26 DIAGNOSIS — E890 Postprocedural hypothyroidism: Secondary | ICD-10-CM

## 2015-02-26 LAB — TSH: TSH: 0.15 u[IU]/mL — ABNORMAL LOW (ref 0.35–4.50)

## 2015-02-26 LAB — T4, FREE: Free T4: 1.72 ng/dL — ABNORMAL HIGH (ref 0.60–1.60)

## 2015-03-01 ENCOUNTER — Ambulatory Visit (INDEPENDENT_AMBULATORY_CARE_PROVIDER_SITE_OTHER): Payer: Medicare Other | Admitting: Endocrinology

## 2015-03-01 ENCOUNTER — Other Ambulatory Visit: Payer: Self-pay

## 2015-03-01 ENCOUNTER — Encounter: Payer: Self-pay | Admitting: Endocrinology

## 2015-03-01 ENCOUNTER — Encounter: Payer: Self-pay | Admitting: *Deleted

## 2015-03-01 VITALS — HR 42 | Temp 98.1°F | Resp 15 | Ht 67.0 in | Wt 159.0 lb

## 2015-03-01 DIAGNOSIS — E890 Postprocedural hypothyroidism: Secondary | ICD-10-CM

## 2015-03-01 DIAGNOSIS — I2589 Other forms of chronic ischemic heart disease: Secondary | ICD-10-CM | POA: Diagnosis not present

## 2015-03-01 DIAGNOSIS — E039 Hypothyroidism, unspecified: Secondary | ICD-10-CM

## 2015-03-01 MED ORDER — LEVOTHYROXINE SODIUM 150 MCG PO TABS: 150.0000 ug | ORAL_TABLET | Freq: Every day | ORAL | Status: DC

## 2015-03-01 NOTE — Progress Notes (Signed)
Patient ID: Johnny Oneill, male   DOB: Aug 04, 1941, 74 y.o.   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  Previously was taking 200 mcg daily since 3/15 when he was hospitalized  Subsequently he has been on a steady dose of 175 mcg since 11/2013 However because of his TSH being nearly 4.0 he was told to take an extra half tablet once a week  He is not complaining of unusual fatigue or weakness and overall feels fairly good except for some shortness of breath on exertion. No cold intolerance  He is not sure where he is getting his medication from, not clear whether it is the same Generic Arnott. TSH is markedly suppressed at this time with mildly increased free T4 even though he had his last labs done about 4 months ago  Compliance with the medical regimen has been as prescribed with taking the tablet in the morning before breakfast.  Has been taking his iron tablet in the evening as previously instructed  Wt Readings from Last 3 Encounters:  03/01/15 159 lb (72.122 kg)  01/02/15 156 lb 6.4 oz (70.943 kg)  11/19/14 156 lb 6.4 oz (70.943 kg)    Lab Results  Component Value Date   TSH 0.15* 02/26/2015   TSH 0.118* 01/02/2015   TSH 3.919 11/01/2014   FREET4 1.72* 02/26/2015   FREET4 1.63 11/01/2014   FREET4 1.29 10/29/2014        Medication List       This list is accurate as of: 03/01/15  1:58 PM.  Always use your most recent med list.               aspirin EC 325 MG tablet  Take 325 mg by mouth daily.     citalopram 10 MG tablet  Commonly known as:  CELEXA  Take 1 tablet (10 mg total) by mouth daily.      CoQ10 200 MG Caps  Take 200 mg by mouth daily.     ezetimibe 10 MG tablet  Commonly known as:  ZETIA  Take 1 tablet (10 mg total) by mouth daily.     ferrous Q000111Q C-folic acid capsule  Commonly known as:  TRINSICON / FOLTRIN  Take 1 capsule by mouth daily.     fluconazole 100 MG tablet  Commonly known as:  DIFLUCAN  Take 1 tablet (100 mg total) by mouth once.     furosemide 20 MG tablet  Commonly known as:  LASIX  Take 1 tablet (20 mg total) by mouth as needed. Take 20 mg as needed for wt > 153 lbs     hydrALAZINE 25 MG tablet  Commonly known as:  APRESOLINE  Take 3 tablets (75 mg total) by mouth 3 (three) times daily.     levothyroxine 150 MCG tablet  Commonly known as:  SYNTHROID, LEVOTHROID  Take 1 tablet (150 mcg total) by mouth daily.     pantoprazole 40 MG tablet  Commonly known as:  PROTONIX  Take 1 tablet (40 mg total) by mouth daily.     potassium chloride SA 20 MEQ tablet  Commonly known as:  K-DUR,KLOR-CON  Take 1 tablet (  20 mEq total) by mouth daily as needed. Take one potassium pill when you take your lasix.     rosuvastatin 20 MG tablet  Commonly known as:  CRESTOR  Take 1 tablet (20 mg total) by mouth daily.     spironolactone 25 MG tablet  Commonly known as:  ALDACTONE  Take 1 tablet (25 mg total) by mouth daily.     temazepam 15 MG capsule  Commonly known as:  RESTORIL  Take 1 capsule (15 mg total) by mouth at bedtime.     warfarin 5 MG tablet  Commonly known as:  COUMADIN  Take 7.5 mg daily except for Monday/Wednesday/Friday take 5 mg.         Past Medical History  Diagnosis Date  . Ischemic cardiomyopathy     a. 10/09 Echo: Sev LV Dysfxn, inf/lat AK, Mod MR.  [name];  s/p Lat MI 2009 with occlusion of the LCX - treated with Promus stenting. Has total occlusion of the  RCA.   . ICD (implantable cardiac defibrillator) in place     PROPHYLACTIC      medtronic  . RBBB   . 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 Hypertension   . PVC (premature ventricular contraction)   . Pacemaker   . CHF (congestive heart failure)   . Shortness of breath   . Automatic implantable cardioverter-defibrillator in situ     Past Surgical History  Procedure Laterality Date  . Defibrillator  2009  . Total knee arthroplasty  11/27/11    left  . Insert / replace / remove pacemaker  2009  . Coronary angioplasty with stent placement  2004  . Coronary angioplasty with stent placement  07/2008  . Knee arthroscopy w/ partial medial meniscectomy  12/2002    left  . Knee arthroscopy      bilaterally  . Total knee arthroplasty  11/27/2011    Procedure: TOTAL KNEE ARTHROPLASTY;  Surgeon: Yvette Rack., MD;  Location: [place];  Service: Orthopedics;  Laterality: Left;  left total knee arthroplasty  . Esophagogastroduodenoscopy N/A 09/15/2013    Procedure: ESOPHAGOGASTRODUODENOSCOPY (EGD);  Surgeon: Ladene Artist, MD;  Location: [facility] ENDOSCOPY;  Service: Endoscopy;  Laterality: N/A;  bedside  . Insertion of implantable left ventricular assist device N/A 09/18/2013    Procedure: INSERTION OF IMPLANTABLE LEFT VENTRICULAR ASSIST DEVICE;  Surgeon: Ivin Poot, MD;  Location: [place];  Service: Open Heart Surgery;  Laterality: N/A;  CIRC ARREST  NITRIC OXIDE  . Intraoperative transesophageal echocardiogram N/A 09/18/2013    Procedure: INTRAOPERATIVE TRANSESOPHAGEAL ECHOCARDIOGRAM;  Surgeon: Ivin Poot, MD;  Location: [place];  Service: Open Heart Surgery;  Laterality: N/A;  . Video assisted thoracoscopy Right 11/06/2013    Procedure: VIDEO ASSISTED THORACOSCOPY;  Surgeon: Gaye Pollack, MD;  Location: [place];  Service: Cardiothoracic;  Laterality: Right;  . Hematoma evacuation Right 11/06/2013    Procedure: EVACUATION HEMATOMA;  Surgeon: Gaye Pollack, MD;  Location: [place];  Service: Cardiothoracic;   Laterality: Right;  . Right heart catheterization N/A 08/25/2013    Procedure: RIGHT HEART CATH;  Surgeon: Jolaine Artist, MD;  Location: [facility] CATH LAB;  Service: Cardiovascular;  Laterality: N/A;  . Right heart catheterization N/A 09/13/2013    Procedure: RIGHT HEART CATH;  Surgeon: Jolaine Artist, MD;  Location: [facility] CATH LAB;  Service: Cardiovascular;  Laterality: N/A;  . Intra-aortic balloon pump insertion N/A 09/14/2013    Procedure: INTRA-AORTIC BALLOON PUMP INSERTION;  Surgeon: Jolaine Artist, MD;  Location: [facility] CATH LAB;  Service: Cardiovascular;  Laterality: N/A;  . Right heart catheterization N/A 02/08/2014    Procedure: RIGHT HEART CATH;  Surgeon: Jolaine Artist, MD;  Location: [facility] CATH LAB;  Service: Cardiovascular;  Laterality: N/A;  . Right heart catheterization N/A 02/12/2014    Procedure: RIGHT HEART CATH;  Surgeon: Jolaine Artist, MD;  Location: [facility] CATH LAB;  Service: Cardiovascular;  Laterality: N/A;    Family History  Problem Relation Age of Onset  . Heart failure Father   . Stroke Father   . Coronary artery disease Mother   . Heart disease Mother   . Hyperlipidemia Sister     Social History:  reports that he has never smoked. He has never used smokeless tobacco. He reports that he drinks alcohol. He reports that he does not use illicit drugs.  Allergies:  Allergies  Allergen Reactions  . Ace Inhibitors     Hypotension and elevated creatinine  . Diovan [Valsartan] Other (See Comments)    Hypotension at low dose, hyperkalemia  . Lipitor [Atorvastatin Calcium] Other (See Comments)    Muscle pain  . Norvasc [Amlodipine] Other (See Comments)    Put patient to sleep   ROS:  Hypertension: on no specific medications for blood pressure except diuretics and hydralazine for his CHF  Appettite is good, weight is stable  Wt Readings from Last 3 Encounters:  03/01/15 159 lb (72.122 kg)  01/02/15 156 lb 6.4 oz (70.943 kg)  11/19/14 156 lb 6.4 oz (70.943 kg)      Examination:   Pulse 42  Temp(Src) 98.1 F (36.7 C) (Oral)  Resp 15  Ht 5\' 7"  (1.702 m)  Wt 159 lb (72.122 kg)  BMI 24.90 kg/m2  SpO2 97%  He looks well Heart rate is relatively fast but difficult to auscultate because of his ventricular assist device Biceps reflexes appear normal    Assessment/Plan:   Post ablative Hypothyroidism:  He has needed fairly stable dose of 175 g of levothyroxine in the past and not clear why his TSH is significantly low now He is not showing any signs or symptoms of hyperthyroidism, free T4 slightly above normal  Will have him reduce his dose to 150 g but have him check levels again in 6 weeks   Dontell Mian 03/01/2015, 1:58 PM

## 2015-03-05 ENCOUNTER — Ambulatory Visit (HOSPITAL_COMMUNITY)
Admission: RE | Admit: 2015-03-05 | Discharge: 2015-03-05 | Disposition: A | Discharge status: Home / Self Care | Payer: Medicare Other | Source: Ambulatory Visit | Attending: Cardiology | Admitting: Cardiology

## 2015-03-05 ENCOUNTER — Other Ambulatory Visit (HOSPITAL_COMMUNITY): Payer: Self-pay | Admitting: Infectious Diseases

## 2015-03-05 ENCOUNTER — Encounter: Payer: Self-pay | Admitting: Internal Medicine

## 2015-03-05 ENCOUNTER — Encounter (HOSPITAL_COMMUNITY): Payer: Self-pay

## 2015-03-05 ENCOUNTER — Ambulatory Visit (HOSPITAL_COMMUNITY): Payer: Self-pay | Admitting: Infectious Diseases

## 2015-03-05 VITALS — BP 110/77 | HR 91 | Wt 159.2 lb

## 2015-03-05 DIAGNOSIS — I5041 Acute combined systolic (congestive) and diastolic (congestive) heart failure: Secondary | ICD-10-CM | POA: Diagnosis not present

## 2015-03-05 DIAGNOSIS — R001 Bradycardia, unspecified: Secondary | ICD-10-CM | POA: Diagnosis not present

## 2015-03-05 DIAGNOSIS — Z955 Presence of coronary angioplasty implant and graft: Secondary | ICD-10-CM | POA: Insufficient documentation

## 2015-03-05 DIAGNOSIS — E780 Pure hypercholesterolemia: Secondary | ICD-10-CM | POA: Diagnosis not present

## 2015-03-05 DIAGNOSIS — I2510 Atherosclerotic heart disease of native coronary artery without angina pectoris: Secondary | ICD-10-CM | POA: Insufficient documentation

## 2015-03-05 DIAGNOSIS — I10 Essential (primary) hypertension: Secondary | ICD-10-CM | POA: Insufficient documentation

## 2015-03-05 DIAGNOSIS — I4510 Unspecified right bundle-branch block: Secondary | ICD-10-CM | POA: Diagnosis not present

## 2015-03-05 DIAGNOSIS — R0600 Dyspnea, unspecified: Secondary | ICD-10-CM

## 2015-03-05 DIAGNOSIS — I5022 Chronic systolic (congestive) heart failure: Secondary | ICD-10-CM | POA: Diagnosis not present

## 2015-03-05 DIAGNOSIS — Z95811 Presence of heart assist device: Secondary | ICD-10-CM

## 2015-03-05 DIAGNOSIS — Z7901 Long term (current) use of anticoagulants: Secondary | ICD-10-CM

## 2015-03-05 DIAGNOSIS — I255 Ischemic cardiomyopathy: Secondary | ICD-10-CM | POA: Insufficient documentation

## 2015-03-05 DIAGNOSIS — B379 Candidiasis, unspecified: Secondary | ICD-10-CM | POA: Diagnosis not present

## 2015-03-05 DIAGNOSIS — E039 Hypothyroidism, unspecified: Secondary | ICD-10-CM | POA: Insufficient documentation

## 2015-03-05 DIAGNOSIS — R0609 Other forms of dyspnea: Secondary | ICD-10-CM

## 2015-03-05 DIAGNOSIS — I252 Old myocardial infarction: Secondary | ICD-10-CM | POA: Insufficient documentation

## 2015-03-05 DIAGNOSIS — I493 Ventricular premature depolarization: Secondary | ICD-10-CM

## 2015-03-05 DIAGNOSIS — Z79899 Other long term (current) drug therapy: Secondary | ICD-10-CM | POA: Insufficient documentation

## 2015-03-05 DIAGNOSIS — B369 Superficial mycosis, unspecified: Secondary | ICD-10-CM

## 2015-03-05 LAB — COMPREHENSIVE METABOLIC PANEL
ALT: 24 U/L (ref 17–63)
AST: 32 U/L (ref 15–41)
Albumin: 4 g/dL (ref 3.5–5.0)
Alkaline Phosphatase: 103 U/L (ref 38–126)
Anion gap: 6 (ref 5–15)
BUN: 23 mg/dL — ABNORMAL HIGH (ref 6–20)
CO2: 28 mmol/L (ref 22–32)
Calcium: 9 mg/dL (ref 8.9–10.3)
Chloride: 103 mmol/L (ref 101–111)
Creatinine, Ser: 1.38 mg/dL — ABNORMAL HIGH (ref 0.61–1.24)
GFR calc Af Amer: 58 mL/min — ABNORMAL LOW (ref >60)
GFR calc non Af Amer: 50 mL/min — ABNORMAL LOW (ref >60)
Glucose, Bld: 115 mg/dL — ABNORMAL HIGH (ref 65–99)
Potassium: 3.9 mmol/L (ref 3.5–5.1)
Sodium: 137 mmol/L (ref 135–145)
Total Bilirubin: 1.2 mg/dL (ref 0.3–1.2)
Total Protein: 7.2 g/dL (ref 6.5–8.1)

## 2015-03-05 LAB — CBC
HCT: 42.6 % (ref 39.0–52.0)
Hemoglobin: 14.2 g/dL (ref 13.0–17.0)
MCH: 31 pg (ref 26.0–34.0)
MCHC: 33.3 g/dL (ref 30.0–36.0)
MCV: 93 fL (ref 78.0–100.0)
Platelets: 132 10*3/uL — ABNORMAL LOW (ref 150–400)
RBC: 4.58 MIL/uL (ref 4.22–5.81)
RDW: 14.4 % (ref 11.5–15.5)
WBC: 6 10*3/uL (ref 4.0–10.5)

## 2015-03-05 LAB — LACTATE DEHYDROGENASE: LDH: 199 U/L — ABNORMAL HIGH (ref 98–192)

## 2015-03-05 LAB — PREALBUMIN: Prealbumin: 22.2 mg/dL (ref 18–38)

## 2015-03-05 LAB — PROTIME-INR
INR: 2.19 — ABNORMAL HIGH (ref 0.00–1.49)
Prothrombin Time: 24.2 seconds — ABNORMAL HIGH (ref 11.6–15.2)

## 2015-03-05 LAB — MAGNESIUM: Magnesium: 1.9 mg/dL (ref 1.7–2.4)

## 2015-03-05 LAB — BRAIN NATRIURETIC PEPTIDE: B Natriuretic Peptide: 261.5 pg/mL — ABNORMAL HIGH (ref 0.0–100.0)

## 2015-03-05 MED ORDER — FLUCONAZOLE 100 MG PO TABS: 100.0000 mg | ORAL_TABLET | Freq: Every day | ORAL | Status: AC

## 2015-03-05 NOTE — Progress Notes (Signed)
Patient ID: Johnny Oneill, male   DOB: [birth year], 74 y.o.   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   Admitted 3/4-3/25/15 with SOB and syncopal episode. Found to have SBO and NG placed and started on TPN. On 11/06/13 developed progressive SOB and hypotension and co-ox 51% and Hgb 6.7. Started on milrinone and transfused. Echo showed nl RV function. Patient had PEA arrest and was intubated and started on pressors. CXR revelaed a R hemothorax and a R CT placed. He required VATs  and evacuation of hemothorax and then tansferred to CIR.   Patient saw Dr. Rayann Heman 01/24/14 for device follow up. Per note: pt is stable and on an appropriate medical regimen. Normal ICD function. R waves have decreased since LVAD placement; CXR revealed stable lead position. He plans to follow clinically and try to avoid lead revision if possible.   Follow up for Heart Failure: Overall feeling great. Very active. Denies SOB/PND/Orthopnea. Denies BRBPR. Weight at home 152-154 pounds. He is taking lasix about once a month.  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 Has not had medications this morning. His wife performs dressing changes weekly. Noticed redness around driveline again. Has had fungal rash before.   ECG: NSR, LBBB-like IVCD  Labs 03/05/2015 INR 2.1, Hgb 14.2, WBC 6, LDH 199, K 3.5, creatinine 1.38   Past Medical History  Diagnosis Date  . Ischemic cardiomyopathy     a. 10/09 Echo: Sev LV Dysfxn, inf/lat AK, Mod MR.  [name];  s/p Lat MI 2009 with occlusion of the LCX - treated with Promus stenting. Has total occlusion of the RCA.   . ICD (implantable cardiac defibrillator) in place     PROPHYLACTIC       medtronic  . RBBB   . 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 Hypertension   . PVC (premature ventricular contraction)   . Pacemaker   . CHF (congestive heart failure)   . Shortness of breath   . Automatic implantable cardioverter-defibrillator in situ     Current Outpatient Prescriptions  Medication Sig Dispense Refill  . aspirin EC 325 MG tablet Take 325 mg by mouth daily.    . citalopram (CELEXA) 10 MG tablet Take 1 tablet (10 mg total) by mouth daily. 90 tablet 2  . Coenzyme Q10 (COQ10) 200 MG CAPS Take 200 mg by mouth daily.    [name] ezetimibe (ZETIA) 10 MG tablet Take 1 tablet (10 mg total) by mouth daily. 90 tablet 3  . ferrous Q000111Q C-folic acid (TRINSICON / FOLTRIN) capsule Take 1 capsule by mouth daily. 90 capsule 3  . furosemide (LASIX) 20 MG tablet Take 1 tablet (20 mg total) by mouth as needed. Take 20 mg as needed for wt > 153 lbs 30 tablet 6  . hydrALAZINE (APRESOLINE) 25 MG tablet Take 3 tablets (75 mg total) by mouth 3 (three) times daily. 270 tablet 11  . levothyroxine (SYNTHROID, LEVOTHROID) 150 MCG tablet Take 1 tablet (150 mcg total) by mouth daily. 90 tablet 1  . pantoprazole (PROTONIX) 40 MG tablet Take 1 tablet (40 mg total) by mouth daily. 30 tablet 6  .  potassium chloride SA (K-DUR,KLOR-CON) 20 MEQ tablet Take 1 tablet (20 mEq total) by mouth daily as needed. Take one potassium pill when you take your lasix. 90 tablet 3  . rosuvastatin (CRESTOR) 20 MG tablet Take 1 tablet (20 mg total) by mouth daily. 90 tablet 3  . spironolactone (ALDACTONE) 25 MG tablet Take 1 tablet (25 mg total) by mouth daily. 90 tablet 3  . temazepam (RESTORIL) 15 MG capsule Take 1 capsule (15 mg total) by mouth at bedtime. 30 capsule 5  . warfarin (COUMADIN) 5 MG tablet Take 7.5 mg daily except for Monday/Wednesday/Friday take 5 mg. 60 tablet 6   No current facility-administered medications for this encounter.   Facility-Administered Medications Ordered in Other Encounters  Medication  Dose Route Frequency Provider Last Rate Last Dose  . etomidate (AMIDATE) injection    Anesthesia Intra-op Sammuel Cooper Mumm, CRNA   20 mg at 02/08/14 0915  . rocuronium [facility]) injection    Anesthesia Intra-op Sammuel Cooper Mumm, CRNA   50 mg at 02/08/14 0932  . succinylcholine (ANECTINE) injection    Anesthesia Intra-op Sammuel Cooper Mumm, CRNA   100 mg at 02/08/14 0915    Ace inhibitors; Diovan; Lipitor; and Norvasc  REVIEW OF SYSTEMS: All systems negative except as listed in HPI, PMH and Problem list.  LVAD interrogation reveals:  PF: 4.0 Pump Speed 9200 PI 7.5 PP 5.0   I reviewed the LVAD parameters from today, and compared the results to the patient's prior recorded data.  No programming changes were made.  The LVAD is functioning within specified parameters.  The patient performs LVAD self-test daily.  LVAD interrogation was negative for any significant power changes, alarms or PI events/speed drops.  LVAD equipment check completed and is in good working order.  Back-up equipment present.   LVAD education done on emergency procedures and precautions and reviewed exit site care.    Filed Vitals:   03/05/15 1130 03/05/15 1131  BP: 92/0 110/77  Pulse: 45   Weight: 159 lb 3.2 oz (72.213 kg)   SpO2: 99%    Physical Exam: GENERAL: Well appearing, male who presents to clinic today in no acute distress; Wife present HEENT: normal  NECK: Supple, JVP 5-6  no bruits. Prominent CV waves  No lymphadenopathy or thyromegaly appreciated.    CARDIAC:  Mechanical heart sounds with LVAD hum present. Irregular heart sounds LUNGS:  Clear to auscultation bilaterally.  ABDOMEN:  Soft, round, nontender, positive bowel sounds x4. Mild distension. nontender LVAD exit site: well-healed and semi incorporated.  Dressing dry and intact.  Erythema with satellite lesions noted 6-12:00.  Stabilization device present and accurately applied.  Driveline dressing is being changed weekly per sterile technique. EXTREMITIES:   Warm and dry, no cyanosis, clubbing, rash.  No edema.  NEUROLOGIC:  Alert and oriented x 4.  Gait steady.  No aphasia.  No dysarthria.  Affect pleasant.     EKG : SR PVCs 94 bpm.   ASSESSMENT AND PLAN:   1) Chronic systolic HF: ICM, EF 0000000 s/p LVAD HM II implant 08/2013 for DT. NYHA II symptoms. Volume status stable. Continue Lasix prn weight > 153.  - Continue spironolactone to 25 mg daily.  - Not on BB with history of RV failure.  - MAP good. Continue current regimen.  - He has not tolerated ACE-I or amlodipine in the past. 2) LVAD: HMII 08/2013 for DT.  Doing well. LVAD parameters stable. - Labs today stable.  - Annual ECHO next visit.  3) Chronic anticoagulation: -  INR goal 2.0-3.0. INR today 2.1  No bleeding issues.   - Continue ASA 325 mg for LVAD. 4) HTN: MAP 92 but has not had morning meds.   5) Hypothyroidism: Per Dr Dwyane Dee. Plans to drop synthroid to 150 mcg.  6) Candidiasis: Noted around drive line. Start diflucan 100 mg daily for 7 days.   F/U 2 months. Check annual ECHO.  Labs today - K 3.9 Creatinine 1.38 LDH 199 Hgb 14.2 WBC 6.9 INR 2.1   CLEGG,AMY, NP-C  03/05/2015   Patient seen with NP, agree with the above note.  Overall pretty stable.  He does appear to have a Candidal infection around his driveline.  Will give week course of Diflucan.  ICD was interrogated, showed fairly frequent PVCs.  No beta blocker given lack of symptoms and h/o RV failure.    Loralie Champagne 03/05/2015

## 2015-03-05 NOTE — Patient Instructions (Addendum)
1. Keep the daily dressing kits for now until cooler weather comes about. Change dressing every 3 - 4 days or if visibly soiled/moist.   2. Start Fluconazole 100 mg (1 pill) every day for 7 days. Should complete 03/11/15  3. INR 2.19. Next INR check Monday 03/11/15 here in clinic. We will change your dressing Monday in [facility] clinic also.   4. Return to [facility] in 2 months with an annual echo.

## 2015-03-05 NOTE — Progress Notes (Addendum)
Johnny Oneill comes to clinic today alone for his 2 month check up.    Symptom Yes No Details  Angina  x Activity:  Claudication  x How far:  Syncope  x When:  Stroke  x   Orthopnea  x How many pillows:  PND  x How often:  CPAP  n/a How many hrs:  Pedal edema  x   Abd fullness  x   N&V  x   Diaphoresis  x When:  Bleeding  x Dark stools--on Iron   Urine  x Clear yellow to ginger ale colored  SOB  x Activity:  Palpitations  x When: denies having any more palpitations (was having occasional episodes d/t high PVC burden)  ICD shock  x   Hospitlizaitons  x When/where/why:  ED visit  x When/where/why:  Other MD x  When/who/why:Dr. Dwyane Dee (endocrine) TSH dose decreased recently.   Activity  x Tolerating normal activity   Fluid  x  about 2 L   Diet  x  Good appetite--eating better.    BP: 92 doppler  Automatic BP: 110/77 (85) Weight: 159.2 b Last Weight: 156 lb HR:  45 SPO2: 99%  VAD interrogation revealed: Speed:  9200 Flow:  4.6 Power: 5.1 PI: 6.0  Alarms: few low voltage alarms, isolated NO EXTERNAL Power alarm--was untangling leads   Events: more frequent than in past; up to 6 daily and occurs 3 x per week.  Fixed speed: 9200 Low speed limit: 8600 Primary Controller:  Replace back up battery in  __15___  Months. (April 2017) Back up controller:   Replace back up battery in  __15___  Months. (April 2017)  I reviewed the LVAD parameters from today, and compared the results to the patient's prior recorded data.  No programming changes were made. The LVAD is functioning within specified parameters.  The patient performs LVAD self-test daily.  LVAD interrogation shows PI events occurring more often: previously rare events with 3 per day; now occurring up to 6 times a day, about 3 days per week. No speed drops. LVAD equipment check completed and is in good working order. Back-up equipment not present.   Drive Line: Called by wife this morning re: drainage from site and rash. Daily  dressing in place correctly. Was using the weekly Sorbaviews but reportedly had increased sweating from working outside and red rash has returned. Erythematous base with satellite lesions noted. Patient reports that it is itchy. There is no drainage as of today removing gauze dressing, however wife reported 'green' drainage. Advised to keep the daily gauze dressings and to change site every 3 - 4 days or if visibly soiled/wet. No need to use silver ribbon at this time since site healed and no drainage. Started on Diflucan again 100 mg daily x 7 days. Reports that he does take a probiotic at home.     Clinic Encounter:  I reviewed daily logs from home, he is taking lasix appropriately when his weight increases beyond 154 lb. Had to take a dose this past Sunday. Doing well overall aside from drive line complaints as above.   HR low today in the 40s at this visit. EKG obtained showing high frequency of PVCs. Device interrogation obtained with Medtronic. Will schedule annual Echo for next clinic visit--Need to monitor PI event frequency since increasing, however patient is not symptomatic at this time. Has reportedly been doing a lot of work outside and sweating. Did take a lasix dose Sunday. Discussed that with him sweating  more in the summer he is losing more water than normal so caution with the lasix dose.   MAP 92 today--has not taken AM medications yet. Normally under better control. Will recheck at next dressing change Monday 03/11/15.  71m INTERMACs completed at this visit. Neurocognitive Trail making completed correctly in 15m 15s. 6 Minute Walk completed 1490'. Tolerated walk well and did not need breaks. QOL surveys completed at this visit.   F/U in VAD clinic 2 months. He needs his annual echocardiogram with next clinic visit. Will obtain precert if necessary and schedule with his next visit. D/W Dr. Aundra Dubin & Amy Clegg, NP-C.

## 2015-03-07 ENCOUNTER — Encounter (HOSPITAL_COMMUNITY): Payer: Self-pay | Admitting: Infectious Diseases

## 2015-03-07 ENCOUNTER — Other Ambulatory Visit (HOSPITAL_COMMUNITY): Payer: Self-pay | Admitting: Infectious Diseases

## 2015-03-07 NOTE — Progress Notes (Signed)
Pt is scheduled for Complete Echocardiogram on 05/07/15 for annual LVAD/CHF surveillance.   CPT Code: F9566416 ICD 10 Code: [id number]  NPCR with Medicare and BCBS Supp. Orders entered.

## 2015-03-11 ENCOUNTER — Ambulatory Visit (HOSPITAL_COMMUNITY): Payer: Self-pay | Admitting: *Deleted

## 2015-03-11 ENCOUNTER — Ambulatory Visit (HOSPITAL_COMMUNITY)
Admission: RE | Admit: 2015-03-11 | Discharge: 2015-03-11 | Disposition: A | Discharge status: Home / Self Care | Payer: Medicare Other | Source: Ambulatory Visit | Attending: Cardiology | Admitting: Cardiology

## 2015-03-11 ENCOUNTER — Other Ambulatory Visit (HOSPITAL_COMMUNITY): Payer: Self-pay | Admitting: Infectious Diseases

## 2015-03-11 DIAGNOSIS — I5022 Chronic systolic (congestive) heart failure: Secondary | ICD-10-CM | POA: Diagnosis not present

## 2015-03-11 DIAGNOSIS — Z7901 Long term (current) use of anticoagulants: Secondary | ICD-10-CM

## 2015-03-11 DIAGNOSIS — Z95811 Presence of heart assist device: Secondary | ICD-10-CM

## 2015-03-11 DIAGNOSIS — Z5181 Encounter for therapeutic drug level monitoring: Secondary | ICD-10-CM | POA: Insufficient documentation

## 2015-03-11 LAB — PROTIME-INR
INR: 3.89 — ABNORMAL HIGH (ref 0.00–1.49)
Prothrombin Time: 37.2 seconds — ABNORMAL HIGH (ref 11.6–15.2)

## 2015-03-11 NOTE — Progress Notes (Addendum)
  LVAD Exit Site:  VAD dressing removed and site care performed using sterile technique. Drive line exit site cleaned with saline wipes x 2, allowed to dry, and gauze dressing re-applied. Exit site well healed and incorporated. The velour is fully implanted at exit site. Decreased erythema, no tenderness, drainage, or foul odor noted. Drive line anchor intact. Pt denies fever or chills. Driveline dressing is being changed twice weekly per sterile technique. Pt will complete 7 day course of Diflucan tomorrow. INR checked today due to interaction between coumadin and Diflucan. Will call pt with INR results and coumadin dosing.  03/11/15 exit site:    03/05/15: Diflucan 100 mg daily started for Candidal infection      Patient Instructions:  1.  Complete Diflucan tomorrow. 2.  Continue using gauze dressings and change twice weekly or as needed to keep site dry and intact. 3.  Call if exit site becomes red or shows any signs of infection.

## 2015-03-11 NOTE — Addendum Note (Signed)
Encounter addended by: Lezlie Octave, RN on: 03/11/2015 11:56 AM<BR>     Documentation filed: Notes Section, Patient Instructions Section

## 2015-03-11 NOTE — Patient Instructions (Signed)
1.  Complete Diflucan tomorrow. 2.  Continue using gauze dressings and change twice weekly or as needed to keep site dry and intact. 3.  Call if exit site becomes red or shows any signs of infection.

## 2015-03-12 ENCOUNTER — Telehealth: Payer: Self-pay | Admitting: Cardiology

## 2015-03-12 NOTE — Telephone Encounter (Signed)
Spoke w/ pt wife and informed her that pt has reached ERI and this is why the alarm is going off. informied pt wife that they will need to come into office to talk w/ MD about getting new device. Pt wife verbalized understanding and is aware that alert tone will go off for the next 16 days at the same time but once it stops that does not mean the problem fixed it self and that pt still needs to come into office. Pt wife aware that a scheduler will be calling to schedule an appt.

## 2015-03-12 NOTE — Telephone Encounter (Signed)
Pt wife called stated pt ICD is making alert sound. Informed pt wife to send manual transmission once they get home. She verbalized understanding.

## 2015-03-25 ENCOUNTER — Ambulatory Visit (HOSPITAL_COMMUNITY): Payer: Self-pay | Admitting: Infectious Diseases

## 2015-03-25 ENCOUNTER — Other Ambulatory Visit (INDEPENDENT_AMBULATORY_CARE_PROVIDER_SITE_OTHER): Payer: Medicare Other | Admitting: *Deleted

## 2015-03-25 ENCOUNTER — Other Ambulatory Visit (HOSPITAL_COMMUNITY): Payer: Self-pay | Admitting: Infectious Diseases

## 2015-03-25 DIAGNOSIS — Z95811 Presence of heart assist device: Secondary | ICD-10-CM

## 2015-03-25 DIAGNOSIS — Z7901 Long term (current) use of anticoagulants: Secondary | ICD-10-CM | POA: Diagnosis not present

## 2015-03-25 DIAGNOSIS — Z5181 Encounter for therapeutic drug level monitoring: Secondary | ICD-10-CM

## 2015-03-25 LAB — PROTIME-INR
INR: 2.4 ratio — ABNORMAL HIGH (ref 0.8–1.0)
Prothrombin Time: 26.5 s — ABNORMAL HIGH (ref 9.6–13.1)

## 2015-03-27 ENCOUNTER — Ambulatory Visit (INDEPENDENT_AMBULATORY_CARE_PROVIDER_SITE_OTHER): Payer: Medicare Other | Admitting: Internal Medicine

## 2015-03-27 ENCOUNTER — Encounter: Payer: Self-pay | Admitting: Internal Medicine

## 2015-03-27 VITALS — BP 102/0 | HR 46 | Ht 67.0 in | Wt 154.6 lb

## 2015-03-27 DIAGNOSIS — I469 Cardiac arrest, cause unspecified: Secondary | ICD-10-CM | POA: Diagnosis not present

## 2015-03-27 DIAGNOSIS — I5022 Chronic systolic (congestive) heart failure: Secondary | ICD-10-CM | POA: Diagnosis not present

## 2015-03-27 DIAGNOSIS — I2589 Other forms of chronic ischemic heart disease: Secondary | ICD-10-CM | POA: Diagnosis not present

## 2015-03-27 DIAGNOSIS — I255 Ischemic cardiomyopathy: Secondary | ICD-10-CM | POA: Diagnosis not present

## 2015-03-27 DIAGNOSIS — R570 Cardiogenic shock: Secondary | ICD-10-CM | POA: Diagnosis not present

## 2015-03-27 NOTE — Patient Instructions (Signed)
Medication Instructions:  Your physician recommends that you continue on your current medications as directed. Please refer to the Current Medication list given to you today.   Labwork: None ordered  Testing/Procedures: None ordered  Follow up  Your physician wants you to follow-up in: 6 months with Dr Rayann Heman Dennis Bast will receive a reminder letter in the mail two months in advance. If you don't receive a letter, please call our office to schedule the follow-up appointment.    Any Other Special Instructions Will Be Listed Below (If Applicable).

## 2015-03-28 LAB — CUP PACEART INCLINIC DEVICE CHECK
Battery Voltage: 2.62 V
Brady Statistic AP VP Percent: 0.04 %
Brady Statistic AP VS Percent: 39.97 %
Brady Statistic AS VP Percent: 0 %
Brady Statistic AS VS Percent: 59.99 %
Brady Statistic RA Percent Paced: 40.01 %
Brady Statistic RV Percent Paced: 0.04 %
Date Time Interrogation Session: 20160727163831
HighPow Impedance: 37 Ohm
HighPow Impedance: 44 Ohm
Lead Channel Impedance Value: 380 Ohm
Lead Channel Impedance Value: 392 Ohm
Lead Channel Pacing Threshold Amplitude: 0.5 V
Lead Channel Pacing Threshold Amplitude: 1 V
Lead Channel Pacing Threshold Pulse Width: 0.4 ms
Lead Channel Pacing Threshold Pulse Width: 1.5 ms
Lead Channel Sensing Intrinsic Amplitude: 2.262
Lead Channel Sensing Intrinsic Amplitude: 2.3296
Lead Channel Setting Pacing Amplitude: 2 V
Lead Channel Setting Pacing Amplitude: 2.5 V
Lead Channel Setting Pacing Pulse Width: 1.5 ms
Lead Channel Setting Sensing Sensitivity: 0.3 mV
Zone Setting Detection Interval: 300 ms
Zone Setting Detection Interval: 350 ms
Zone Setting Detection Interval: 350 ms
Zone Setting Detection Interval: 350 ms

## 2015-03-28 NOTE — Progress Notes (Signed)
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 male who presents today for electrophysiology followup.  Since last being seen in our clinic, the patient has done well.  He feels better with his VAD in place.  Recently his ICD has reached ERI and has been beeping with auditory alert tone.  Today, he denies symptoms of palpitations, chest pain, shortness of breath,  lower extremity edema, dizziness, presyncope, syncope, or ICD shocks.  The patient is otherwise without complaint today.   Past Medical History  Diagnosis Date  . Ischemic cardiomyopathy     a. 10/09 Echo: Sev LV Dysfxn, inf/lat AK, Mod MR.  [name];  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 Hypertension   . PVC (premature ventricular contraction)   . Pacemaker   . CHF (congestive heart failure)   . Shortness of breath   . Automatic implantable cardioverter-defibrillator in situ    Past Surgical History  Procedure Laterality Date  . Defibrillator  2009  . Total knee arthroplasty  11/27/11    left  . Insert / replace / remove pacemaker  2009  . Coronary angioplasty with stent placement  2004  . Coronary angioplasty with stent placement  07/2008  . Knee arthroscopy w/ partial medial meniscectomy  12/2002    left  . Knee arthroscopy      bilaterally  . Total knee arthroplasty  11/27/2011    Procedure: TOTAL KNEE ARTHROPLASTY;  Surgeon: Yvette Rack., MD;  Location: [place];  Service: Orthopedics;  Laterality: Left;  left total knee arthroplasty  . Esophagogastroduodenoscopy N/A 09/15/2013    Procedure: ESOPHAGOGASTRODUODENOSCOPY (EGD);   Surgeon: Ladene Artist, MD;  Location: [facility] ENDOSCOPY;  Service: Endoscopy;  Laterality: N/A;  bedside  . Insertion of implantable left ventricular assist device N/A 09/18/2013    Procedure: INSERTION OF IMPLANTABLE LEFT VENTRICULAR ASSIST DEVICE;  Surgeon: Ivin Poot, MD;  Location: [place];  Service: Open Heart Surgery;  Laterality: N/A;  CIRC ARREST  NITRIC OXIDE  . Intraoperative transesophageal echocardiogram N/A 09/18/2013    Procedure: INTRAOPERATIVE TRANSESOPHAGEAL ECHOCARDIOGRAM;  Surgeon: Ivin Poot, MD;  Location: [place];  Service: Open Heart Surgery;  Laterality: N/A;  . Video assisted thoracoscopy Right 11/06/2013    Procedure: VIDEO ASSISTED THORACOSCOPY;  Surgeon: Gaye Pollack, MD;  Location: [place];  Service: Cardiothoracic;  Laterality: Right;  . Hematoma evacuation Right 11/06/2013    Procedure: EVACUATION HEMATOMA;  Surgeon: Gaye Pollack, MD;  Location: [place];  Service: Cardiothoracic;  Laterality: Right;  . Right heart catheterization N/A 08/25/2013    Procedure: RIGHT HEART CATH;  Surgeon: Jolaine Artist, MD;  Location: [facility] CATH LAB;  Service: Cardiovascular;  Laterality: N/A;  . Right heart catheterization N/A 09/13/2013    Procedure: RIGHT HEART CATH;  Surgeon: Jolaine Artist, MD;  Location: [facility] CATH LAB;  Service: Cardiovascular;  Laterality: N/A;  . Intra-aortic balloon pump insertion N/A 09/14/2013    Procedure: INTRA-AORTIC BALLOON PUMP  INSERTION;  Surgeon: Jolaine Artist, MD;  Location: [facility] CATH LAB;  Service: Cardiovascular;  Laterality: N/A;  . Right heart catheterization N/A 02/08/2014    Procedure: RIGHT HEART CATH;  Surgeon: Jolaine Artist, MD;  Location: [facility] CATH LAB;  Service: Cardiovascular;  Laterality: N/A;  . Right heart catheterization N/A 02/12/2014    Procedure: RIGHT HEART CATH;  Surgeon: Jolaine Artist, MD;  Location: [facility] CATH LAB;  Service: Cardiovascular;  Laterality: N/A;    Current Outpatient Prescriptions  Medication Sig Dispense  Refill  . aspirin EC 325 MG tablet Take 325 mg by mouth daily.    . citalopram (CELEXA) 10 MG tablet Take 1 tablet (10 mg total) by mouth daily. 90 tablet 2  . Coenzyme Q10 (COQ10) 200 MG CAPS Take 200 mg by mouth daily.    [name] ezetimibe (ZETIA) 10 MG tablet Take 1 tablet (10 mg total) by mouth daily. 90 tablet 3  . ferrous Q000111Q C-folic acid (TRINSICON / FOLTRIN) capsule Take 1 capsule by mouth daily. 90 capsule 3  . furosemide (LASIX) 20 MG tablet Take 20 mg by mouth daily as needed (Take 20 mg as daily as needed for wt > 153 lbs).    . hydrALAZINE (APRESOLINE) 25 MG tablet Take 3 tablets (75 mg total) by mouth 3 (three) times daily. 270 tablet 11  . levothyroxine (SYNTHROID, LEVOTHROID) 150 MCG tablet Take 1 tablet (150 mcg total) by mouth daily. 90 tablet 1  . pantoprazole (PROTONIX) 40 MG tablet Take 1 tablet (40 mg total) by mouth daily. 30 tablet 6  . potassium chloride SA (K-DUR,KLOR-CON) 20 MEQ tablet Take 1 tablet (20 mEq total) by mouth daily as needed. Take one potassium pill when you take your lasix. 90 tablet 3  . Probiotic Product (PROBIOTIC PO) Take 1 capsule by mouth daily.    . rosuvastatin (CRESTOR) 20 MG tablet Take 1 tablet (20 mg total) by mouth daily. 90 tablet 3  . spironolactone (ALDACTONE) 25 MG tablet Take 1 tablet (25 mg total) by mouth daily. 90 tablet 3  . temazepam (RESTORIL) 15 MG capsule Take 1 capsule (15 mg total) by mouth at bedtime. 30 capsule 5  . warfarin (COUMADIN) 5 MG tablet Take 7.5 mg by mouth every day except take 5 mg by mouth on Wednesdays     No current facility-administered medications for this visit.   Facility-Administered Medications Ordered in Other Visits  Medication Dose Route Frequency Provider Last Rate Last Dose  . etomidate (AMIDATE) injection    Anesthesia Intra-op Sammuel Cooper Mumm, CRNA   20 mg at 02/08/14 0915  . rocuronium [facility]) injection    Anesthesia Intra-op Sammuel Cooper Mumm, CRNA   50 mg at 02/08/14 0932  .  succinylcholine (ANECTINE) injection    Anesthesia Intra-op Sammuel Cooper Mumm, CRNA   100 mg at 02/08/14 0915    Physical Exam: Filed Vitals:   03/27/15 1343  BP: 102/0  Pulse: 46  Height: 5\' 7"  (1.702 m)  Weight: 70.126 kg (154 lb 9.6 oz)    GEN- The patient is well appearing, alert and oriented x 3 today.   Head- normocephalic, atraumatic Eyes-  Sclera clear, conjunctiva pink Ears- hearing intact Oropharynx- clear Lungs- Clear to ausculation bilaterally, normal work of breathing Chest- ICD pocket is well healed Heart- Regular rate and rhythm, VAD hum GI- soft, NT, ND, + BS Extremities- no clubbing, cyanosis, or edema  ICD interrogation- reviewed in detail today,  See PACEART report  Assessment and Plan:  1.  Chronic systolic dysfunction euvolemic today Stable on an appropriate medical regimen Optivol is reviewed and normal Normal ICD function though he has reached ERI.  R waves have decreased since LVAD placement but are stable. See Claudia Desanctis Art report Auditory alerts were turned off for battery status today.  I spoke at length with patient, spouse, and Dr Haroldine Laws (by phone).  As the patient has a VAD in place, the importance of an ICD for this patient going forward is somewhat questionable.  I have offered options of 1. Not replacing the ICD (Dr Haroldine Laws and my recommendation), 2. gen change only, or 3. gen change with RV lead revision.  Risks and benefits to each were discussed.  At this time, the patient and his wife would prefer avoidance of any further procedures.  I think that this is the correct approach. He atrial paces 40%.  We will need to follow closely to make sure that he does not decompensate with loss of atrial pacing in the future, though I think that this is less likely.  I would like to continue monthly carelinks for now and see again in 6 months.  He will continue to follow closely with Dr Haroldine Laws in the interim.  Today, I have spent 25  minutes with the  patient discussing his ICD status.  More than 50% of the visit time today was spent on this issue.    Thompson Grayer MD, [facility] 03/28/2015 11:06 AM

## 2015-03-29 ENCOUNTER — Encounter: Payer: Self-pay | Admitting: Internal Medicine

## 2015-04-08 ENCOUNTER — Other Ambulatory Visit (INDEPENDENT_AMBULATORY_CARE_PROVIDER_SITE_OTHER): Payer: Medicare Other | Admitting: *Deleted

## 2015-04-08 ENCOUNTER — Ambulatory Visit (HOSPITAL_COMMUNITY): Payer: Self-pay | Admitting: Infectious Diseases

## 2015-04-08 DIAGNOSIS — Z5181 Encounter for therapeutic drug level monitoring: Secondary | ICD-10-CM

## 2015-04-08 DIAGNOSIS — Z7901 Long term (current) use of anticoagulants: Secondary | ICD-10-CM | POA: Diagnosis not present

## 2015-04-08 LAB — PROTIME-INR
INR: 2.5 ratio — ABNORMAL HIGH (ref 0.8–1.0)
Prothrombin Time: 26.7 s — ABNORMAL HIGH (ref 9.6–13.1)

## 2015-04-08 NOTE — Addendum Note (Signed)
Addended by: Eulis Foster on: 04/08/2015 09:58 AM   Modules accepted: Orders

## 2015-04-22 NOTE — Telephone Encounter (Signed)
Opened in error

## 2015-04-24 ENCOUNTER — Other Ambulatory Visit (INDEPENDENT_AMBULATORY_CARE_PROVIDER_SITE_OTHER): Payer: Medicare Other | Admitting: *Deleted

## 2015-04-24 ENCOUNTER — Ambulatory Visit: Payer: Self-pay | Admitting: *Deleted

## 2015-04-24 DIAGNOSIS — Z5181 Encounter for therapeutic drug level monitoring: Secondary | ICD-10-CM | POA: Diagnosis not present

## 2015-04-24 DIAGNOSIS — Z7901 Long term (current) use of anticoagulants: Secondary | ICD-10-CM | POA: Diagnosis not present

## 2015-04-24 LAB — PROTIME-INR
INR: 2.8 ratio — ABNORMAL HIGH (ref 0.8–1.0)
Prothrombin Time: 30.5 s — ABNORMAL HIGH (ref 9.6–13.1)

## 2015-04-29 ENCOUNTER — Encounter: Payer: Medicare Other | Admitting: *Deleted

## 2015-04-30 ENCOUNTER — Other Ambulatory Visit: Payer: Self-pay | Admitting: Internal Medicine

## 2015-04-30 ENCOUNTER — Encounter: Payer: Self-pay | Admitting: Cardiology

## 2015-04-30 ENCOUNTER — Other Ambulatory Visit (HOSPITAL_COMMUNITY): Payer: Self-pay | Admitting: *Deleted

## 2015-05-07 ENCOUNTER — Ambulatory Visit (HOSPITAL_COMMUNITY): Payer: Self-pay | Admitting: Infectious Diseases

## 2015-05-07 ENCOUNTER — Other Ambulatory Visit (HOSPITAL_COMMUNITY): Payer: Self-pay | Admitting: *Deleted

## 2015-05-07 ENCOUNTER — Encounter (HOSPITAL_COMMUNITY): Payer: Self-pay

## 2015-05-07 ENCOUNTER — Ambulatory Visit (HOSPITAL_COMMUNITY)
Admission: RE | Admit: 2015-05-07 | Discharge: 2015-05-07 | Disposition: A | Discharge status: Home / Self Care | Payer: Medicare Other | Source: Ambulatory Visit | Attending: Cardiology | Admitting: Cardiology

## 2015-05-07 ENCOUNTER — Ambulatory Visit (HOSPITAL_BASED_OUTPATIENT_CLINIC_OR_DEPARTMENT_OTHER)
Admission: RE | Admit: 2015-05-07 | Discharge: 2015-05-07 | Disposition: A | Discharge status: Home / Self Care | Payer: Medicare Other | Source: Ambulatory Visit | Attending: Cardiology | Admitting: Cardiology

## 2015-05-07 VITALS — BP 108/78 | HR 86 | Wt 155.0 lb

## 2015-05-07 DIAGNOSIS — R0600 Dyspnea, unspecified: Secondary | ICD-10-CM | POA: Diagnosis not present

## 2015-05-07 DIAGNOSIS — Z7901 Long term (current) use of anticoagulants: Secondary | ICD-10-CM

## 2015-05-07 DIAGNOSIS — I10 Essential (primary) hypertension: Secondary | ICD-10-CM | POA: Diagnosis not present

## 2015-05-07 DIAGNOSIS — E7800 Pure hypercholesterolemia, unspecified: Secondary | ICD-10-CM

## 2015-05-07 DIAGNOSIS — Z955 Presence of coronary angioplasty implant and graft: Secondary | ICD-10-CM | POA: Diagnosis not present

## 2015-05-07 DIAGNOSIS — I252 Old myocardial infarction: Secondary | ICD-10-CM | POA: Diagnosis not present

## 2015-05-07 DIAGNOSIS — I5022 Chronic systolic (congestive) heart failure: Secondary | ICD-10-CM

## 2015-05-07 DIAGNOSIS — Z95811 Presence of heart assist device: Secondary | ICD-10-CM

## 2015-05-07 DIAGNOSIS — Z79899 Other long term (current) drug therapy: Secondary | ICD-10-CM | POA: Diagnosis not present

## 2015-05-07 DIAGNOSIS — F419 Anxiety disorder, unspecified: Secondary | ICD-10-CM | POA: Diagnosis not present

## 2015-05-07 DIAGNOSIS — E039 Hypothyroidism, unspecified: Secondary | ICD-10-CM | POA: Insufficient documentation

## 2015-05-07 DIAGNOSIS — I2510 Atherosclerotic heart disease of native coronary artery without angina pectoris: Secondary | ICD-10-CM | POA: Diagnosis not present

## 2015-05-07 DIAGNOSIS — I5041 Acute combined systolic (congestive) and diastolic (congestive) heart failure: Secondary | ICD-10-CM

## 2015-05-07 DIAGNOSIS — Z7982 Long term (current) use of aspirin: Secondary | ICD-10-CM | POA: Diagnosis not present

## 2015-05-07 DIAGNOSIS — I255 Ischemic cardiomyopathy: Secondary | ICD-10-CM | POA: Diagnosis not present

## 2015-05-07 LAB — BASIC METABOLIC PANEL
Anion gap: 8 (ref 5–15)
BUN: 21 mg/dL — ABNORMAL HIGH (ref 6–20)
CO2: 24 mmol/L (ref 22–32)
Calcium: 9.3 mg/dL (ref 8.9–10.3)
Chloride: 105 mmol/L (ref 101–111)
Creatinine, Ser: 1.31 mg/dL — ABNORMAL HIGH (ref 0.61–1.24)
GFR calc Af Amer: >60 mL/min (ref >60)
GFR calc non Af Amer: 53 mL/min — ABNORMAL LOW (ref >60)
Glucose, Bld: 122 mg/dL — ABNORMAL HIGH (ref 65–99)
Potassium: 4 mmol/L (ref 3.5–5.1)
Sodium: 137 mmol/L (ref 135–145)

## 2015-05-07 LAB — CBC
HCT: 44.8 % (ref 39.0–52.0)
Hemoglobin: 14.7 g/dL (ref 13.0–17.0)
MCH: 30.8 pg (ref 26.0–34.0)
MCHC: 32.8 g/dL (ref 30.0–36.0)
MCV: 93.7 fL (ref 78.0–100.0)
Platelets: 120 10*3/uL — ABNORMAL LOW (ref 150–400)
RBC: 4.78 MIL/uL (ref 4.22–5.81)
RDW: 14 % (ref 11.5–15.5)
WBC: 5.2 10*3/uL (ref 4.0–10.5)

## 2015-05-07 LAB — PROTIME-INR
INR: 2.36 — ABNORMAL HIGH (ref 0.00–1.49)
Prothrombin Time: 25.5 seconds — ABNORMAL HIGH (ref 11.6–15.2)

## 2015-05-07 LAB — LACTATE DEHYDROGENASE: LDH: 166 U/L (ref 98–192)

## 2015-05-07 LAB — BRAIN NATRIURETIC PEPTIDE: B Natriuretic Peptide: 230.8 pg/mL — ABNORMAL HIGH (ref 0.0–100.0)

## 2015-05-07 NOTE — Progress Notes (Signed)
Johnny Oneill comes to clinic today alone for his 2 month check up.    Symptom Yes No Details  Angina  x Activity:  Claudication  x How far:  Syncope  x When:  Stroke  x   Orthopnea  x How many pillows:  PND  x How often:  CPAP  n/a How many hrs:  Pedal edema  x   Abd fullness  x   N&V  x   Diaphoresis  x When:  Bleeding  x Dark stools--on Iron   Urine  x Clear yellow to ginger ale colored  SOB  x Activity:  Palpitations  x When: denies having any more palpitations (was having occasional episodes d/t high PVC burden)  ICD shock  x   Hospitlizaitons  x When/where/why:  ED visit  x When/where/why:  Other MD x  When/who/why: Dr. Rayann Heman at the end of July. ERI on device    Activity  x Tolerating normal activity   Fluid  x  about 2 L   Diet  x  Good appetite--eating better.    BP: 100 doppler  Automatic BP: 108/78 (92)  Weight: 155 lb Last Weight: 159.2 b HR:  86 SPO2: 99%  VAD interrogation revealed: Speed:  9200 Flow: 5.0 Power: 5.3 PI: 7.0 Alarms: few low voltage alarms Events: up to 6 daily and occurs 3 x per week.  Fixed speed: 9200 Low speed limit: 8600 Primary Controller:  Replace back up battery in  __15___  Months. (April 2017) Back up controller:   Replace back up battery in  __15___  Months. (April 2017)  I reviewed the LVAD parameters from today, and compared the results to the patient's prior recorded data.  No programming changes were made. The LVAD is functioning within specified parameters.  The patient performs LVAD self-test daily.  LVAD interrogation shows PI events occurring more often: previously rare events with 3 per day; now occurring up to 6 times a day, about 3 days per week. No speed drops. LVAD equipment check completed and is in good working order. Back-up equipment not present.   Drive Line: Reports no further itching or rash. Dressing removed and sterile dressing changed performed with SALINE WIPES (CHG allergy). Small scab removed in the 1300  position, no bleeding. [employer] anchors provided to him during visit. States adequate supplies otherwise.   Clinic Encounter:  I reviewed daily logs from home, he is taking lasix appropriately when his weight increases beyond 154 lb. He has not needed any lasix/KCl since July sometime. No complaints at the visit today and is feeling well. Went to gun show recently and enjoyed his time. MAP 92 today-- has taken all his medications thus far today.   Annual echocardiogram completed today prior to clinic visit. Requesting to have his Lipid Panel checked next time. Will arrange to have this checked fasting with next INR.   No medication changes this visit.   Janene Madeira, RN VAD Coordinator   Office: [phone] 24/7 VAD Pager: [phone]

## 2015-05-07 NOTE — Progress Notes (Signed)
Patient ID: Johnny Oneill, male   DOB: 04/09/1941, 74 y.o.   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   Admitted 3/4-3/25/15 with SOB and syncopal episode. Found to have SBO and NG placed and started on TPN. On 11/06/13 developed progressive SOB and hypotension and co-ox 51% and Hgb 6.7. Started on milrinone and transfused. Echo showed nl RV function. Patient had PEA arrest and was intubated and started on pressors. CXR revelaed a R hemothorax and a R CT placed. He required VATs  and evacuation of hemothorax and then tansferred to CIR.   Patient saw Dr. Rayann Heman 01/24/14 for device follow up. Per note: pt is stable and on an appropriate medical regimen. Normal ICD function. R waves have decreased since LVAD placement; CXR revealed stable lead position. He plans to follow clinically and try to avoid lead revision if possible.   Follow up for Heart Failure: Overall feeling great. . Denies SOB/PND/Orthopnea. Denies BRBPR. Weight at home 150-152 pounds. No bleeding problems.  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 Has not had medications this morning. His wife performs dressing changes weekly.   Echo today showed mild LV dilation, EF 20%, LVAD cannula apically, moderately RV dilation with mild to moderately decreased RV systolic function.   Labs 03/05/2015 INR 2.1, Hgb 14.2, WBC 6, LDH 199, K 3.5, creatinine 1.38, BNP 230   Past Medical History  Diagnosis Date  . Ischemic cardiomyopathy     a. 10/09 Echo: Sev LV Dysfxn, inf/lat AK, Mod MR.  [name];  s/p Lat MI 2009 with occlusion of the LCX - treated with Promus stenting. Has total occlusion of the RCA.   . ICD (implantable cardiac defibrillator) in  place     PROPHYLACTIC      medtronic  . RBBB   . 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 Hypertension   . PVC (premature ventricular contraction)   . Pacemaker   . CHF (congestive heart failure)   . Shortness of breath   . Automatic implantable cardioverter-defibrillator in situ     Current Outpatient Prescriptions  Medication Sig Dispense Refill  . aspirin EC 325 MG tablet Take 325 mg by mouth daily.    . citalopram (CELEXA) 10 MG tablet Take 1 tablet (10 mg total) by mouth daily. 90 tablet 2  . Coenzyme Q10 (COQ10) 200 MG CAPS Take 200 mg by mouth daily.    [name] ezetimibe (ZETIA) 10 MG tablet Take 1 tablet (10 mg total) by mouth daily. 90 tablet 3  . ferrous Q000111Q C-folic acid (TRINSICON / FOLTRIN) capsule Take 1 capsule by mouth daily. 90 capsule 3  . furosemide (LASIX) 20 MG tablet Take 20 mg by mouth daily as needed (Take 20 mg as daily as needed for wt > 153 lbs).    . hydrALAZINE (APRESOLINE) 25 MG tablet Take 3 tablets (75 mg total) by mouth 3 (three) times daily. 270 tablet 11  . levothyroxine (SYNTHROID, LEVOTHROID) 150 MCG tablet Take 1 tablet (150 mcg total) by mouth daily. 90 tablet 1  . pantoprazole (PROTONIX) 40 MG tablet Take 1 tablet (40 mg total) by mouth  daily. 30 tablet 6  . potassium chloride SA (K-DUR,KLOR-CON) 20 MEQ tablet Take 1 tablet (20 mEq total) by mouth daily as needed. Take one potassium pill when you take your lasix. 90 tablet 3  . Probiotic Product (PROBIOTIC PO) Take 1 capsule by mouth daily.    . rosuvastatin (CRESTOR) 20 MG tablet Take 1 tablet (20 mg total) by mouth daily. 90 tablet 3  . spironolactone (ALDACTONE) 25 MG tablet Take 1 tablet (25 mg total) by mouth daily. 90 tablet 3  . temazepam (RESTORIL) 15 MG capsule TAKE ONE CAPSULE BY MOUTH AT BEDTIME 30 capsule 0  . warfarin (COUMADIN) 5 MG tablet Take 7.5 mg by mouth every day except take 5 mg by mouth on Wednesdays     No current facility-administered medications for this encounter.    Facility-Administered Medications Ordered in Other Encounters  Medication Dose Route Frequency Provider Last Rate Last Dose  . etomidate (AMIDATE) injection    Anesthesia Intra-op Sammuel Cooper Mumm, CRNA   20 mg at 02/08/14 0915  . rocuronium [facility]) injection    Anesthesia Intra-op Sammuel Cooper Mumm, CRNA   50 mg at 02/08/14 0932  . succinylcholine (ANECTINE) injection    Anesthesia Intra-op Sammuel Cooper Mumm, CRNA   100 mg at 02/08/14 0915    Ace inhibitors; Diovan; Lipitor; and Norvasc  REVIEW OF SYSTEMS: All systems negative except as listed in HPI, PMH and Problem list.  LVAD interrogation reveals:  PF: 5.0 Pump Speed 9200 PI 7.0 PP 5.3 Rare PI events.   I reviewed the LVAD parameters from today, and compared the results to the patient's prior recorded data.  No programming changes were made.  The LVAD is functioning within specified parameters.  The patient performs LVAD self-test daily.  LVAD interrogation was negative for any significant power changes, alarms or PI events/speed drops.  LVAD equipment check completed and is in good working order.  Back-up equipment present.   LVAD education done on emergency procedures and precautions and reviewed exit site care.    Filed Vitals:   05/07/15 1204 05/07/15 1205  BP: 100/0 108/78  Pulse: 86   Weight: 155 lb (70.308 kg)    Physical Exam: GENERAL: Well appearing, male who presents to clinic today in no acute distress; Wife present HEENT: normal  NECK: Supple, JVP 5-6  no bruits. Prominent CV waves  No lymphadenopathy or thyromegaly appreciated.    CARDIAC:  Mechanical heart sounds with LVAD hum present. Irregular heart sounds LUNGS:  Clear to auscultation bilaterally.  ABDOMEN:  Soft, round, nontender, positive bowel sounds x4. Mild distension. nontender LVAD exit site: well-healed and semi incorporated.  Dressing dry and intact.  Erythema with satellite lesions noted 6-12:00.  Stabilization device present and accurately applied.   Driveline dressing is being changed weekly per sterile technique. EXTREMITIES:  Warm and dry, no cyanosis, clubbing, rash.  No edema.  NEUROLOGIC:  Alert and oriented x 4.  Gait steady.  No aphasia.  No dysarthria.  Affect pleasant.      ASSESSMENT AND PLAN:   1) Chronic systolic HF: ICM, EF 0000000 s/p LVAD HM II implant 08/2013 for DT. NYHA II symptoms. Volume status stable. Continue Lasix prn weight > 153.  - Continue spironolactone to 25 mg daily.  - Not on BB with history of RV failure.  - MAP ok.  Continue current regimen.  - He has not tolerated ACE-I or amlodipine in the past. 2) LVAD: HMII 08/2013 for DT.  Doing well. LVAD parameters  stable. - Labs today stable.  - Annual ECHO discussed and reviewed by Dr Aundra Dubin. Stable. [name]  3) Chronic anticoagulation: -  INR goal 2.0-3.0. INR today 2.3  No bleeding issues.   - Continue ASA 325 mg for LVAD. 4) HTN: MAP 100  but has not had morning meds.   5) Hypothyroidism: Per Dr Dwyane Dee. Continue synthroid 150 mcg.  6) Candidiasis: resolved.    F/U 2 months.   Labs today - K 4.0 Creatinine 1.31 LDH 166 Hgb 14.7 WBC 6.9 INR 2.3  CLEGG,AMY, NP-C  05/07/2015   Patient seen with NP, agree with the above note.  He is generally doing well.  Echo today was stable compared to the past.  Stable LVAD parameters with rare PI events.  Fungal infection at driveline exit appears to have resolved.  MAP mildly high but may be "white coat HTN."    Loralie Champagne 05/07/2015

## 2015-05-07 NOTE — Progress Notes (Signed)
Echocardiogram 2D Echocardiogram has been performed.  Johnny Oneill 05/07/2015, 10:49 AM

## 2015-05-07 NOTE — Patient Instructions (Signed)
1. No medication changes today. Echo showed same heart function as before.   2. Your drive line site looks great. Keep it up.   3. Fasting lab work on Tuesday 05/28/15 for INR and Cholesterol check.   4. Come back to see [place] in 2 months.

## 2015-05-11 ENCOUNTER — Other Ambulatory Visit (HOSPITAL_COMMUNITY): Payer: Self-pay | Admitting: Internal Medicine

## 2015-05-20 ENCOUNTER — Other Ambulatory Visit (HOSPITAL_COMMUNITY): Payer: Self-pay | Admitting: *Deleted

## 2015-05-20 DIAGNOSIS — Z7901 Long term (current) use of anticoagulants: Secondary | ICD-10-CM

## 2015-05-20 DIAGNOSIS — Z95811 Presence of heart assist device: Secondary | ICD-10-CM

## 2015-05-20 MED ORDER — WARFARIN SODIUM 5 MG PO TABS: ORAL_TABLET | ORAL | Status: DC

## 2015-05-20 MED ORDER — WARFARIN SODIUM 5 MG PO TABS: 5.0000 mg | ORAL_TABLET | Freq: Once | ORAL | Status: DC

## 2015-05-28 ENCOUNTER — Ambulatory Visit (HOSPITAL_COMMUNITY): Payer: Self-pay | Admitting: *Deleted

## 2015-05-28 ENCOUNTER — Other Ambulatory Visit (HOSPITAL_COMMUNITY): Payer: Self-pay | Admitting: *Deleted

## 2015-05-28 ENCOUNTER — Other Ambulatory Visit (INDEPENDENT_AMBULATORY_CARE_PROVIDER_SITE_OTHER): Payer: Medicare Other | Admitting: *Deleted

## 2015-05-28 DIAGNOSIS — Z7901 Long term (current) use of anticoagulants: Secondary | ICD-10-CM

## 2015-05-28 DIAGNOSIS — Z95811 Presence of heart assist device: Secondary | ICD-10-CM

## 2015-05-28 DIAGNOSIS — E7800 Pure hypercholesterolemia, unspecified: Secondary | ICD-10-CM

## 2015-05-28 LAB — LIPID PANEL
Cholesterol: 93 mg/dL (ref 0–200)
HDL: 41.5 mg/dL (ref >39.00)
LDL Cholesterol: 29 mg/dL (ref 0–99)
NonHDL: 51.67
Total CHOL/HDL Ratio: 2
Triglycerides: 112 mg/dL (ref 0.0–149.0)
VLDL: 22.4 mg/dL (ref 0.0–40.0)

## 2015-05-28 LAB — PROTIME-INR
INR: 2.6 ratio — ABNORMAL HIGH (ref 0.8–1.0)
Prothrombin Time: 28.1 s — ABNORMAL HIGH (ref 9.6–13.1)

## 2015-05-28 NOTE — Addendum Note (Signed)
Addended by: Domenica Reamer R on: 05/28/2015 09:42 AM   Modules accepted: Orders

## 2015-05-30 ENCOUNTER — Telehealth: Payer: Self-pay | Admitting: Cardiology

## 2015-05-30 ENCOUNTER — Other Ambulatory Visit (HOSPITAL_COMMUNITY): Payer: Self-pay | Admitting: *Deleted

## 2015-05-30 DIAGNOSIS — I5020 Unspecified systolic (congestive) heart failure: Secondary | ICD-10-CM

## 2015-05-30 DIAGNOSIS — E785 Hyperlipidemia, unspecified: Secondary | ICD-10-CM

## 2015-05-30 MED ORDER — EZETIMIBE 10 MG PO TABS
10.0000 mg | ORAL_TABLET | Freq: Every day | ORAL | Status: DC
Start: 2015-05-30 — End: 2015-09-30

## 2015-05-30 MED ORDER — ROSUVASTATIN CALCIUM 20 MG PO TABS
20.0000 mg | ORAL_TABLET | Freq: Every day | ORAL | Status: DC
Start: 2015-05-30 — End: 2016-06-08

## 2015-05-30 NOTE — Telephone Encounter (Signed)
Spoke w/ pt wife and she stated that they were aware that pt had reached ERI and they have discussed w/ MD to not have device changed out. Pt wife wants to know if remote follow up is still necessary at this point. Informed pt wife that I would send MD a note and call her back with an answer.

## 2015-05-30 NOTE — Telephone Encounter (Signed)
Pt wife informed me that she had already told pt what the call was about.

## 2015-05-30 NOTE — Telephone Encounter (Signed)
Follow up      Returning Johnny Oneill's call

## 2015-05-31 NOTE — Telephone Encounter (Signed)
Do not need to follow remotely going forward.

## 2015-06-03 NOTE — Telephone Encounter (Signed)
Informed pt wife that there is no need to follow up remotely going forward. Pt wife verbalized understanding.

## 2015-06-03 NOTE — Telephone Encounter (Signed)
LMOVM for pt wife to return call.  

## 2015-06-11 ENCOUNTER — Other Ambulatory Visit (INDEPENDENT_AMBULATORY_CARE_PROVIDER_SITE_OTHER): Payer: Medicare Other

## 2015-06-11 ENCOUNTER — Ambulatory Visit (HOSPITAL_COMMUNITY): Payer: Self-pay | Admitting: *Deleted

## 2015-06-11 DIAGNOSIS — Z7901 Long term (current) use of anticoagulants: Secondary | ICD-10-CM | POA: Diagnosis not present

## 2015-06-11 DIAGNOSIS — I10 Essential (primary) hypertension: Secondary | ICD-10-CM

## 2015-06-11 DIAGNOSIS — Z95811 Presence of heart assist device: Secondary | ICD-10-CM

## 2015-06-12 LAB — PROTIME-INR
INR: 1.92 — ABNORMAL HIGH (ref <1.50)
Prothrombin Time: 22.2 seconds — ABNORMAL HIGH (ref 11.6–15.2)

## 2015-06-18 ENCOUNTER — Encounter (HOSPITAL_COMMUNITY): Payer: Self-pay | Admitting: *Deleted

## 2015-06-26 ENCOUNTER — Other Ambulatory Visit (INDEPENDENT_AMBULATORY_CARE_PROVIDER_SITE_OTHER): Payer: Medicare Other | Admitting: *Deleted

## 2015-06-26 ENCOUNTER — Other Ambulatory Visit (HOSPITAL_COMMUNITY): Payer: Self-pay | Admitting: *Deleted

## 2015-06-26 DIAGNOSIS — Z7901 Long term (current) use of anticoagulants: Secondary | ICD-10-CM | POA: Diagnosis not present

## 2015-06-26 DIAGNOSIS — I10 Essential (primary) hypertension: Secondary | ICD-10-CM | POA: Diagnosis not present

## 2015-06-26 DIAGNOSIS — I2510 Atherosclerotic heart disease of native coronary artery without angina pectoris: Secondary | ICD-10-CM

## 2015-06-26 DIAGNOSIS — E7800 Pure hypercholesterolemia, unspecified: Secondary | ICD-10-CM

## 2015-06-26 DIAGNOSIS — I255 Ischemic cardiomyopathy: Secondary | ICD-10-CM

## 2015-06-26 DIAGNOSIS — Z95811 Presence of heart assist device: Secondary | ICD-10-CM

## 2015-06-26 DIAGNOSIS — I2129 ST elevation (STEMI) myocardial infarction involving other sites: Secondary | ICD-10-CM

## 2015-06-26 LAB — PROTIME-INR
INR: 1.76 — ABNORMAL HIGH (ref <1.50)
Prothrombin Time: 20.8 seconds — ABNORMAL HIGH (ref 11.6–15.2)

## 2015-06-26 NOTE — Addendum Note (Signed)
Addended by: Domenica Reamer R on: 06/26/2015 10:20 AM   Modules accepted: Orders

## 2015-06-27 ENCOUNTER — Ambulatory Visit (HOSPITAL_COMMUNITY): Payer: Self-pay | Admitting: Infectious Diseases

## 2015-06-27 DIAGNOSIS — R791 Abnormal coagulation profile: Secondary | ICD-10-CM

## 2015-06-27 DIAGNOSIS — Z7901 Long term (current) use of anticoagulants: Secondary | ICD-10-CM

## 2015-07-03 ENCOUNTER — Other Ambulatory Visit (HOSPITAL_COMMUNITY): Payer: Self-pay | Admitting: Infectious Diseases

## 2015-07-03 DIAGNOSIS — Z7901 Long term (current) use of anticoagulants: Secondary | ICD-10-CM

## 2015-07-03 DIAGNOSIS — Z95811 Presence of heart assist device: Secondary | ICD-10-CM

## 2015-07-03 DIAGNOSIS — R791 Abnormal coagulation profile: Secondary | ICD-10-CM

## 2015-07-04 ENCOUNTER — Other Ambulatory Visit (INDEPENDENT_AMBULATORY_CARE_PROVIDER_SITE_OTHER): Payer: Medicare Other

## 2015-07-04 ENCOUNTER — Telehealth (HOSPITAL_COMMUNITY): Payer: Self-pay

## 2015-07-04 ENCOUNTER — Telehealth (HOSPITAL_COMMUNITY): Payer: Self-pay | Admitting: Infectious Diseases

## 2015-07-04 DIAGNOSIS — I2510 Atherosclerotic heart disease of native coronary artery without angina pectoris: Secondary | ICD-10-CM

## 2015-07-04 NOTE — Telephone Encounter (Signed)
A user error has taken place: encounter opened in error, closed for administrative reasons.

## 2015-07-04 NOTE — Telephone Encounter (Signed)
Called patient about missed INR--rescheduled to tomorrow at [facility]. Instructed to go early in the AM to avoid delay of result.

## 2015-07-04 NOTE — Telephone Encounter (Signed)
Message for Johnny Oneill that he missed INR today because of picking up wife from hospital.  Completely forgot.  Like to rewchedule [phone]

## 2015-07-05 ENCOUNTER — Telehealth (HOSPITAL_COMMUNITY): Payer: Self-pay | Admitting: Infectious Diseases

## 2015-07-05 ENCOUNTER — Other Ambulatory Visit (INDEPENDENT_AMBULATORY_CARE_PROVIDER_SITE_OTHER): Payer: Medicare Other | Admitting: *Deleted

## 2015-07-05 DIAGNOSIS — I255 Ischemic cardiomyopathy: Secondary | ICD-10-CM

## 2015-07-05 DIAGNOSIS — I2589 Other forms of chronic ischemic heart disease: Secondary | ICD-10-CM | POA: Diagnosis not present

## 2015-07-05 NOTE — Telephone Encounter (Signed)
Called to update that INR results are not back yet. Will look tomorrow to update them. Verbalized understanding.

## 2015-07-06 LAB — PROTIME-INR
INR: 2.25 — ABNORMAL HIGH (ref <1.50)
Prothrombin Time: 25.2 seconds — ABNORMAL HIGH (ref 11.6–15.2)

## 2015-07-08 ENCOUNTER — Ambulatory Visit (HOSPITAL_COMMUNITY): Payer: Self-pay | Admitting: Infectious Diseases

## 2015-07-09 ENCOUNTER — Telehealth: Payer: Self-pay | Admitting: Internal Medicine

## 2015-07-09 NOTE — Telephone Encounter (Signed)
Return kit ordered. Patient notified.

## 2015-07-09 NOTE — Telephone Encounter (Signed)
New message      Pt has a heart pump.  His pacemaker battery is end of life.  He is not going to have it replaced---please call wife and let her know you will take him off the list and let her know what to do with the box

## 2015-07-10 ENCOUNTER — Ambulatory Visit (HOSPITAL_COMMUNITY): Payer: Self-pay | Admitting: *Deleted

## 2015-07-10 ENCOUNTER — Encounter (HOSPITAL_COMMUNITY): Payer: Self-pay | Admitting: *Deleted

## 2015-07-10 ENCOUNTER — Ambulatory Visit (HOSPITAL_COMMUNITY)
Admission: RE | Admit: 2015-07-10 | Discharge: 2015-07-10 | Disposition: A | Discharge status: Home / Self Care | Payer: Medicare Other | Source: Ambulatory Visit | Attending: Internal Medicine | Admitting: Internal Medicine

## 2015-07-10 VITALS — BP 94/0 | HR 76 | Ht 69.0 in | Wt 162.4 lb

## 2015-07-10 DIAGNOSIS — I493 Ventricular premature depolarization: Secondary | ICD-10-CM | POA: Diagnosis not present

## 2015-07-10 DIAGNOSIS — I110 Hypertensive heart disease with heart failure: Secondary | ICD-10-CM | POA: Insufficient documentation

## 2015-07-10 DIAGNOSIS — Z95811 Presence of heart assist device: Secondary | ICD-10-CM

## 2015-07-10 DIAGNOSIS — R001 Bradycardia, unspecified: Secondary | ICD-10-CM | POA: Diagnosis not present

## 2015-07-10 DIAGNOSIS — E039 Hypothyroidism, unspecified: Secondary | ICD-10-CM | POA: Diagnosis not present

## 2015-07-10 DIAGNOSIS — Z7901 Long term (current) use of anticoagulants: Secondary | ICD-10-CM | POA: Diagnosis not present

## 2015-07-10 DIAGNOSIS — I5022 Chronic systolic (congestive) heart failure: Secondary | ICD-10-CM

## 2015-07-10 LAB — PROTIME-INR
INR: 2.12 — ABNORMAL HIGH (ref 0.00–1.49)
Prothrombin Time: 23.6 seconds — ABNORMAL HIGH (ref 11.6–15.2)

## 2015-07-10 LAB — CBC
HCT: 46.8 % (ref 39.0–52.0)
Hemoglobin: 15.4 g/dL (ref 13.0–17.0)
MCH: 31.3 pg (ref 26.0–34.0)
MCHC: 32.9 g/dL (ref 30.0–36.0)
MCV: 95.1 fL (ref 78.0–100.0)
Platelets: 136 10*3/uL — ABNORMAL LOW (ref 150–400)
RBC: 4.92 MIL/uL (ref 4.22–5.81)
RDW: 14.4 % (ref 11.5–15.5)
WBC: 5.9 10*3/uL (ref 4.0–10.5)

## 2015-07-10 LAB — BASIC METABOLIC PANEL
Anion gap: 8 (ref 5–15)
BUN: 21 mg/dL — ABNORMAL HIGH (ref 6–20)
CO2: 24 mmol/L (ref 22–32)
Calcium: 9.3 mg/dL (ref 8.9–10.3)
Chloride: 106 mmol/L (ref 101–111)
Creatinine, Ser: 1.37 mg/dL — ABNORMAL HIGH (ref 0.61–1.24)
GFR calc Af Amer: 58 mL/min — ABNORMAL LOW (ref >60)
GFR calc non Af Amer: 50 mL/min — ABNORMAL LOW (ref >60)
Glucose, Bld: 104 mg/dL — ABNORMAL HIGH (ref 65–99)
Potassium: 4.1 mmol/L (ref 3.5–5.1)
Sodium: 138 mmol/L (ref 135–145)

## 2015-07-10 LAB — LACTATE DEHYDROGENASE: LDH: 257 U/L — ABNORMAL HIGH (ref 98–192)

## 2015-07-10 LAB — BRAIN NATRIURETIC PEPTIDE: B Natriuretic Peptide: 346 pg/mL — ABNORMAL HIGH (ref 0.0–100.0)

## 2015-07-10 NOTE — Progress Notes (Signed)
VAD CLINIC NOTE  Patient ID: Johnny Oneill, male   DOB: September 10, 1940, 74 y.o.   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   Admitted 3/4-3/25/15 with SOB and syncopal episode. Found to have SBO and NG placed and started on TPN. On 11/06/13 developed progressive SOB and hypotension and co-ox 51% and Hgb 6.7. Started on milrinone and transfused. Echo showed nl RV function. Patient had PEA arrest and was intubated and started on pressors. CXR revelaed a R hemothorax and a R CT placed. He required VATs  and evacuation of hemothorax and then tansferred to CIR.    Follow up for Heart Failure: Overall doing very well. Denies SOB/PND/Orthopnea. Has gained a few pounds but attributes it to his appetite.  No bleeding problems.  Seen by Dr. Rayann Heman and device at [facility]. No plans to replace.    Echo today showed mild LV dilation, EF 20%, LVAD cannula apically, moderately RV dilation with mild to moderately decreased RV systolic function.   Labs 03/05/2015 INR 2.1, Hgb 14.2, WBC 6, LDH 199, K 3.5, creatinine 1.38, BNP 230   Past Medical History  Diagnosis Date  . Ischemic cardiomyopathy     a. 10/09 Echo: Sev LV Dysfxn, inf/lat AK, Mod MR.  [name];  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 Hypertension   . PVC (premature ventricular contraction)   . Pacemaker   . CHF (congestive heart failure)   . Shortness of breath   . Automatic implantable cardioverter-defibrillator in situ     Current Outpatient Prescriptions  Medication  Sig Dispense Refill  . aspirin EC 325 MG tablet Take 325 mg by mouth daily.    . citalopram (CELEXA) 10 MG tablet Take 1 tablet (10 mg total) by mouth daily. 90 tablet 2  . Coenzyme Q10 (COQ10) 200 MG CAPS Take 200 mg by mouth daily.    [name] ezetimibe (ZETIA) 10 MG tablet Take 1 tablet (10 mg total) by mouth daily. 30 tablet 3  . furosemide (LASIX) 20 MG tablet Take 20 mg by mouth daily as needed (Take 20 mg as daily as needed for wt > 153 lbs).    . hydrALAZINE (APRESOLINE) 25 MG tablet Take 3 tablets (75 mg total) by mouth 3 (three) times daily. 270 tablet 11  . levothyroxine (SYNTHROID) 100 MCG tablet Take 100 mcg by mouth daily before breakfast. Take 1 tab daily except 1.5 on Wed    . pantoprazole (PROTONIX) 40 MG tablet Take 1 tablet (40 mg total) by mouth daily. 30 tablet 6  . potassium chloride SA (K-DUR,KLOR-CON) 20 MEQ tablet Take 1 tablet (20 mEq total) by mouth daily as needed. Take one potassium pill when you take your lasix. 90 tablet 3  . Probiotic Product (PROBIOTIC PO) Take 1 capsule by mouth daily.    . rosuvastatin (CRESTOR) 20 MG tablet Take 1 tablet (20 mg total)  by mouth daily. 90 tablet 3  . spironolactone (ALDACTONE) 25 MG tablet Take 1 tablet (25 mg total) by mouth daily. 90 tablet 3  . temazepam (RESTORIL) 15 MG capsule TAKE ONE CAPSULE BY MOUTH AT BEDTIME 30 capsule 0  . warfarin (COUMADIN) 5 MG tablet Take 1.5 tablets every day but Wednesday or as directed by clinic 60 tablet 12   No current facility-administered medications for this encounter.   Facility-Administered Medications Ordered in Other Encounters  Medication Dose Route Frequency Provider Last Rate Last Dose  . etomidate (AMIDATE) injection    Anesthesia Intra-op Sammuel Cooper Mumm, CRNA   20 mg at 02/08/14 0915  . rocuronium [facility]) injection    Anesthesia Intra-op Sammuel Cooper Mumm, CRNA   50 mg at 02/08/14 0932  . succinylcholine (ANECTINE) injection    Anesthesia Intra-op Sammuel Cooper Mumm, CRNA   100 mg at  02/08/14 0915    Ace inhibitors; Diovan; Lipitor; and Norvasc  REVIEW OF SYSTEMS: All systems negative except as listed in HPI, PMH and Problem list.    VAD interrogation revealed: Speed: 9200 Flow: 4.8 Power: 5.4 PI: 6.9 Alarms: none Events: 0 - 5 PI events Fixed speed: 9200 Low speed limit: 8600 Primary Controller: Replace back up battery in 11 months. (April 2017) Back up controller: Replace back up battery in 11 months. (April 2017)  I reviewed the LVAD parameters from today, and compared the results to the patient's prior recorded data.  No programming changes were made.  The LVAD is functioning within specified parameters.  The patient performs LVAD self-test daily.  LVAD interrogation was negative for any significant power changes, alarms or PI events/speed drops.  LVAD equipment check completed and is in good working order.  Back-up equipment present.   LVAD education done on emergency procedures and precautions and reviewed exit site care.    Filed Vitals:   07/10/15 1029 07/10/15 1030  BP: 136/69 94/0  Pulse: 76   Height: 5\' 9"  (1.753 m)   Weight: 162 lb 6.4 oz (73.664 kg)   SpO2: 93%    Doppler MAP: 94  Automatic BP: 136/68 (88) Weight: 162.4 lb Last Weight: 155 lb HR: 76 SPO2: 93 %   Physical Exam: GENERAL: Well appearing, male who presents to clinic today in no acute distress; Wife present HEENT: normal  NECK: Supple, JVP 5-6  no bruits. Prominent CV waves  No lymphadenopathy or thyromegaly appreciated.    CARDIAC:  Mechanical heart sounds with LVAD hum present.  LUNGS:  Clear to auscultation bilaterally.  ABDOMEN:  Soft, round, nontender, positive bowel sounds x4. Mild distension. nontender LVAD exit site: well-healed and semi incorporated.  Dressing dry and intact. Stabilization device present and accurately applied.  Driveline dressing is being changed weekly per sterile technique. EXTREMITIES:  Warm and dry, no cyanosis, clubbing, rash.  No  edema.  NEUROLOGIC:  Alert and oriented x 4.  Gait steady.  No aphasia.  No dysarthria.  Affect pleasant.     ECG: NSR with v-pacing and frequent PVCs.   ASSESSMENT AND PLAN:   1) Chronic systolic HF: ICM, EF 0000000 s/p LVAD HM II implant 08/2013 for DT. NYHA II symptoms. Volume status stable. Continue Lasix prn weight > 153. re.  - MAP ok.  Continue current regimen.  - He has not tolerated ACE-I or amlodipine in the past. - ICD at [facility]. No plans to replace. He atrial paces 40%. We will need to follow closely to make sure that he does not decompensate  with loss of atrial pacing. He is not on any negative chronotropic agents currently.  2) LVAD: HMII 08/2013 for DT.  Doing well. LVAD parameters stable. 3) Chronic anticoagulation: -  INR goal 2.0-3.0. INR today 2.3  No bleeding issues.   - Continue ASA 325 mg for LVAD. 4) HTN: MAP 80-90. Continue current meds.  5) Hypothyroidism: Per Dr Dwyane Dee. Continue synthroid 150 mcg.    Glori Bickers  MD  07/10/2015

## 2015-07-10 NOTE — Patient Instructions (Signed)
1.  No change in medication  2.  Will call you with INR results and coumadin dosing 3   Return to [place] clinic in 2 months

## 2015-07-10 NOTE — Progress Notes (Signed)
Johnny Oneill comes to clinic with his wife.   Symptom Yes No Details  Angina  x Activity:  Claudication  x How far:  Syncope  x When:  Stroke  x   Orthopnea  x How many pillows:  PND  x How often:  CPAP  n/a How many hrs:  Pedal edema  x   Abd fullness  x   N&V  x   Diaphoresis  x When:  Bleeding  x Dark stools--on Iron   Urine  x Clear yellow to ginger ale colored  SOB  x Activity:  Palpitations  x When: denies having any more palpitations (was having occasional episodes d/t high PVC burden)  ICD shock  x   Hospitlizaitons  x When/where/why:  ED visit  x When/where/why:  Other MD x  When/who/why: Dr. Rayann Heman at the end of July. ERI on device    Activity  x Tolerating normal activity   Fluid  x  about 2 L   Diet  x  Good appetite--eating better.    Doppler MAP:  94  Automatic BP:  136/68 (88) Weight: 162.4 lb Last Weight: 155 lb HR:  76 SPO2: 93 %  Pulse ox showing HR 42 - 49.  On 03/07/15, last device check per Dr. Rayann Heman revealed Medtronic dual ICD at [facility]. Pt was 40% a-pacing at that time with recommendation per Dr. Rayann Heman to monitor patient for decompensation. Pt denies fatigue, dizziness, lightheadedness, or syncopal episodes.  EKG obtained and revealed acclerated junctional rhythm at 76 bpm with occasional PVC; reviewed by Darrick Grinder, NP.    VAD interrogation revealed: Speed:  9200 Flow: 4.8 Power: 5.4 PI: 6.9 Alarms: none Events: 0 - 5 PI events Fixed speed: 9200 Low speed limit: 8600 Primary Controller:  Replace back up battery in 11 months. (April 2017) Back up controller:   Replace back up battery in 11 months. (April 2017)  I reviewed the LVAD parameters from today, and compared the results to the patient's prior recorded data.  No programming changes were made. The LVAD is functioning within specified parameters.  The patient performs LVAD self-test daily.  LVAD interrogation shows no change in PI events, power surges, or speed drops. LVAD equipment check completed  and is in good working order. Back-up equipment not present.   Drive Line: Reports no further itching or rash. Gauze dressing dry and intact with anchor in place and accurately applied. Dressing changes are being performed twice weekly using gauze dressing and cleansing with SALINE WIPES (CHG allergy).  Wife plans to start trying sorbaview dressings as weather is cooler now. Dressing supplies provided for home use.   Patient Instructions: 1.  No change in medication  2.  Will call you with INR results and coumadin dosing 3   Return to [place] clinic in 2 months  Zada Girt, RN [place] Coordinator  Office: [phone] [address] [place] Pager: [phone]

## 2015-07-15 DIAGNOSIS — M25561 Pain in right knee: Secondary | ICD-10-CM | POA: Diagnosis not present

## 2015-07-23 ENCOUNTER — Telehealth (HOSPITAL_COMMUNITY): Payer: Self-pay

## 2015-07-23 ENCOUNTER — Ambulatory Visit (HOSPITAL_COMMUNITY): Payer: Self-pay | Admitting: *Deleted

## 2015-07-23 ENCOUNTER — Ambulatory Visit (HOSPITAL_COMMUNITY): Payer: Self-pay | Admitting: Infectious Diseases

## 2015-07-23 ENCOUNTER — Other Ambulatory Visit (INDEPENDENT_AMBULATORY_CARE_PROVIDER_SITE_OTHER): Payer: Medicare Other

## 2015-07-23 DIAGNOSIS — Z7901 Long term (current) use of anticoagulants: Secondary | ICD-10-CM | POA: Diagnosis not present

## 2015-07-23 DIAGNOSIS — Z95811 Presence of heart assist device: Secondary | ICD-10-CM

## 2015-07-23 DIAGNOSIS — I10 Essential (primary) hypertension: Secondary | ICD-10-CM

## 2015-07-23 LAB — PROTIME-INR
INR: 2.48 — ABNORMAL HIGH (ref <1.50)
Prothrombin Time: 27.2 seconds — ABNORMAL HIGH (ref 11.6–15.2)

## 2015-07-23 MED ORDER — HYDRALAZINE HCL 25 MG PO TABS: 75.0000 mg | ORAL_TABLET | Freq: Three times a day (TID) | ORAL | Status: DC

## 2015-07-23 NOTE — Telephone Encounter (Signed)
[facility] called with an abnormal INR of 2.48  Routed to [employer]

## 2015-07-23 NOTE — Progress Notes (Signed)
Called re: missed INR appt. Going to have lab drawn now. Refill sent to requested pharmacy for hydralazine.

## 2015-07-24 NOTE — Telephone Encounter (Signed)
See anticoag note. Patient on coumadin and WNL for his prescribed range .

## 2015-08-07 ENCOUNTER — Ambulatory Visit (HOSPITAL_COMMUNITY): Payer: Self-pay | Admitting: *Deleted

## 2015-08-07 ENCOUNTER — Ambulatory Visit (HOSPITAL_COMMUNITY)
Admission: RE | Admit: 2015-08-07 | Discharge: 2015-08-07 | Disposition: A | Discharge status: Home / Self Care | Payer: Medicare Other | Source: Ambulatory Visit | Attending: Internal Medicine | Admitting: Internal Medicine

## 2015-08-07 ENCOUNTER — Other Ambulatory Visit (HOSPITAL_COMMUNITY): Payer: Self-pay | Admitting: *Deleted

## 2015-08-07 DIAGNOSIS — Z95811 Presence of heart assist device: Secondary | ICD-10-CM

## 2015-08-07 DIAGNOSIS — I5022 Chronic systolic (congestive) heart failure: Secondary | ICD-10-CM | POA: Insufficient documentation

## 2015-08-07 DIAGNOSIS — Z7901 Long term (current) use of anticoagulants: Secondary | ICD-10-CM

## 2015-08-07 LAB — PROTIME-INR
INR: 2.13 — ABNORMAL HIGH (ref 0.00–1.49)
Prothrombin Time: 23.6 seconds — ABNORMAL HIGH (ref 11.6–15.2)

## 2015-08-22 ENCOUNTER — Ambulatory Visit (HOSPITAL_COMMUNITY)
Admission: RE | Admit: 2015-08-22 | Discharge: 2015-08-22 | Disposition: A | Discharge status: Home / Self Care | Payer: Medicare Other | Source: Ambulatory Visit | Attending: Internal Medicine | Admitting: Internal Medicine

## 2015-08-22 ENCOUNTER — Ambulatory Visit (HOSPITAL_COMMUNITY): Payer: Self-pay | Admitting: Infectious Diseases

## 2015-08-22 DIAGNOSIS — Z7901 Long term (current) use of anticoagulants: Secondary | ICD-10-CM | POA: Diagnosis not present

## 2015-08-22 DIAGNOSIS — Z95811 Presence of heart assist device: Secondary | ICD-10-CM | POA: Diagnosis not present

## 2015-08-22 LAB — PROTIME-INR
INR: 2.34 — ABNORMAL HIGH (ref 0.00–1.49)
Prothrombin Time: 25.4 seconds — ABNORMAL HIGH (ref 11.6–15.2)

## 2015-09-02 ENCOUNTER — Encounter (HOSPITAL_BASED_OUTPATIENT_CLINIC_OR_DEPARTMENT_OTHER): Payer: Self-pay

## 2015-09-02 ENCOUNTER — Emergency Department (HOSPITAL_BASED_OUTPATIENT_CLINIC_OR_DEPARTMENT_OTHER)
Admission: EM | Admit: 2015-09-02 | Discharge: 2015-09-03 | Disposition: A | Discharge status: Home / Self Care | Payer: Medicare Other | Attending: Emergency Medicine | Admitting: Emergency Medicine

## 2015-09-02 ENCOUNTER — Emergency Department (HOSPITAL_BASED_OUTPATIENT_CLINIC_OR_DEPARTMENT_OTHER): Payer: Medicare Other

## 2015-09-02 DIAGNOSIS — I509 Heart failure, unspecified: Secondary | ICD-10-CM | POA: Insufficient documentation

## 2015-09-02 DIAGNOSIS — S0181XA Laceration without foreign body of other part of head, initial encounter: Secondary | ICD-10-CM | POA: Diagnosis not present

## 2015-09-02 DIAGNOSIS — Z9861 Coronary angioplasty status: Secondary | ICD-10-CM | POA: Diagnosis not present

## 2015-09-02 DIAGNOSIS — I252 Old myocardial infarction: Secondary | ICD-10-CM | POA: Insufficient documentation

## 2015-09-02 DIAGNOSIS — W19XXXA Unspecified fall, initial encounter: Secondary | ICD-10-CM

## 2015-09-02 DIAGNOSIS — S0101XA Laceration without foreign body of scalp, initial encounter: Secondary | ICD-10-CM | POA: Diagnosis not present

## 2015-09-02 DIAGNOSIS — R9439 Abnormal result of other cardiovascular function study: Secondary | ICD-10-CM | POA: Diagnosis not present

## 2015-09-02 DIAGNOSIS — Z7901 Long term (current) use of anticoagulants: Secondary | ICD-10-CM | POA: Diagnosis not present

## 2015-09-02 DIAGNOSIS — I10 Essential (primary) hypertension: Secondary | ICD-10-CM | POA: Diagnosis not present

## 2015-09-02 DIAGNOSIS — Z79899 Other long term (current) drug therapy: Secondary | ICD-10-CM | POA: Insufficient documentation

## 2015-09-02 DIAGNOSIS — F419 Anxiety disorder, unspecified: Secondary | ICD-10-CM | POA: Diagnosis not present

## 2015-09-02 DIAGNOSIS — Y998 Other external cause status: Secondary | ICD-10-CM | POA: Insufficient documentation

## 2015-09-02 DIAGNOSIS — M1990 Unspecified osteoarthritis, unspecified site: Secondary | ICD-10-CM | POA: Diagnosis not present

## 2015-09-02 DIAGNOSIS — I2510 Atherosclerotic heart disease of native coronary artery without angina pectoris: Secondary | ICD-10-CM | POA: Diagnosis not present

## 2015-09-02 DIAGNOSIS — W108XXA Fall (on) (from) other stairs and steps, initial encounter: Secondary | ICD-10-CM | POA: Insufficient documentation

## 2015-09-02 DIAGNOSIS — Z7982 Long term (current) use of aspirin: Secondary | ICD-10-CM | POA: Diagnosis not present

## 2015-09-02 DIAGNOSIS — E7800 Pure hypercholesterolemia, unspecified: Secondary | ICD-10-CM | POA: Diagnosis not present

## 2015-09-02 DIAGNOSIS — R220 Localized swelling, mass and lump, head: Secondary | ICD-10-CM | POA: Diagnosis not present

## 2015-09-02 DIAGNOSIS — Z9889 Other specified postprocedural states: Secondary | ICD-10-CM | POA: Diagnosis not present

## 2015-09-02 DIAGNOSIS — Y9389 Activity, other specified: Secondary | ICD-10-CM | POA: Diagnosis not present

## 2015-09-02 DIAGNOSIS — E039 Hypothyroidism, unspecified: Secondary | ICD-10-CM | POA: Diagnosis not present

## 2015-09-02 DIAGNOSIS — R7989 Other specified abnormal findings of blood chemistry: Secondary | ICD-10-CM | POA: Diagnosis not present

## 2015-09-02 DIAGNOSIS — Y9289 Other specified places as the place of occurrence of the external cause: Secondary | ICD-10-CM | POA: Insufficient documentation

## 2015-09-02 DIAGNOSIS — Z95810 Presence of automatic (implantable) cardiac defibrillator: Secondary | ICD-10-CM | POA: Diagnosis not present

## 2015-09-02 DIAGNOSIS — IMO0002 Reserved for concepts with insufficient information to code with codable children: Secondary | ICD-10-CM

## 2015-09-02 DIAGNOSIS — S299XXA Unspecified injury of thorax, initial encounter: Secondary | ICD-10-CM | POA: Diagnosis not present

## 2015-09-02 DIAGNOSIS — R778 Other specified abnormalities of plasma proteins: Secondary | ICD-10-CM

## 2015-09-02 DIAGNOSIS — S0990XA Unspecified injury of head, initial encounter: Secondary | ICD-10-CM | POA: Diagnosis present

## 2015-09-02 HISTORY — DX: Presence of heart assist device: Z95.811

## 2015-09-02 MED ORDER — ACETAMINOPHEN 325 MG PO TABS
650.0000 mg | ORAL_TABLET | Freq: Once | ORAL | Status: AC
  Administered 2015-09-03: 650 mg via ORAL
  Filled 2015-09-02: qty 2

## 2015-09-02 NOTE — ED Notes (Addendum)
Pt reports he tripped on a shirt sleeve he was carrying and hit his head against the door, laceration to top aspect of head, gauze applied, area continues to bleed, pt denies LOC, on Warfarin with LVAD in place.

## 2015-09-02 NOTE — ED Provider Notes (Signed)
CSN: [id number]     Arrival date & time 09/02/15  2330 History   First MD Initiated Contact with Patient 09/02/15 2335     Chief Complaint  Patient presents with  . Head Injury     (Consider location/radiation/quality/duration/timing/severity/associated sxs/prior Treatment) HPI   Johnny Oneill is a 75 y.o. 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  Patient reports he was carrying 2 long sleeved shirts when he tripped on one of the sleeves causing him to fall forward down 2 steps hitting the front door with his head.  Now presenting with headache and head wound.  Denies CP, SOB, N/V, visual disturbances, urinary symptoms, or syncope.  He does report an episode of CP and SOB PTA when he was mistaken for a fugitive and was held at [employer] by police.  The CP and SOB have resolved now.    Past Medical History  Diagnosis Date  . Ischemic cardiomyopathy     a. 10/09 Echo: Sev LV Dysfxn, inf/lat AK, Mod MR.  [name];  s/p Lat MI 2009 with occlusion of the LCX - treated with Promus stenting. Has total occlusion of the RCA.   . ICD (implantable cardiac defibrillator) in place     PROPHYLACTIC      medtronic  . RBBB   . Inferior MI ([place]) "? date"; 2009  . Hypertension   . PVC (premature ventricular contraction)   . Pacemaker   . CHF (congestive heart failure) ([place])   . Shortness of breath   . Automatic implantable cardioverter-defibrillator in situ   . LVAD (left ventricular assist device) present [facility])    Past Surgical History  Procedure Laterality Date  . Defibrillator  2009  . Total knee arthroplasty  11/27/11    left  . Insert / replace / remove pacemaker  2009  . Coronary angioplasty with stent placement  2004  . Coronary angioplasty with  stent placement  07/2008  . Knee arthroscopy w/ partial medial meniscectomy  12/2002    left  . Knee arthroscopy      bilaterally  . Total knee arthroplasty  11/27/2011    Procedure: TOTAL KNEE ARTHROPLASTY;  Surgeon: Yvette Rack., MD;  Location: [place];  Service: Orthopedics;  Laterality: Left;  left total knee arthroplasty  . Esophagogastroduodenoscopy N/A 09/15/2013    Procedure: ESOPHAGOGASTRODUODENOSCOPY (EGD);  Surgeon: Ladene Artist, MD;  Location: [facility] ENDOSCOPY;  Service: Endoscopy;  Laterality: N/A;  bedside  . Insertion of implantable left ventricular assist device N/A 09/18/2013    Procedure: INSERTION OF IMPLANTABLE LEFT VENTRICULAR ASSIST DEVICE;  Surgeon: Ivin Poot, MD;  Location: [place];  Service: Open Heart Surgery;  Laterality: N/A;  [facility] ARREST  NITRIC OXIDE  . Intraoperative transesophageal echocardiogram N/A 09/18/2013    Procedure: INTRAOPERATIVE TRANSESOPHAGEAL ECHOCARDIOGRAM;  Surgeon: Ivin Poot, MD;  Location: [place];  Service: Open Heart Surgery;  Laterality: N/A;  . Video assisted thoracoscopy Right 11/06/2013    Procedure: VIDEO ASSISTED THORACOSCOPY;  Surgeon: Gaye Pollack, MD;  Location: [place];  Service: Cardiothoracic;  Laterality: Right;  . Hematoma evacuation Right 11/06/2013    Procedure: EVACUATION HEMATOMA;  Surgeon: Gaye Pollack, MD;  Location[facility]  OR;  Service: Cardiothoracic;  Laterality: Right;  . Right heart catheterization N/A 08/25/2013    Procedure: RIGHT HEART CATH;  Surgeon: Jolaine Artist, MD;  Location: [facility] CATH LAB;  Service: Cardiovascular;  Laterality: N/A;  . Right heart catheterization N/A 09/13/2013    Procedure: RIGHT HEART CATH;  Surgeon: Jolaine Artist, MD;  Location: [facility] CATH LAB;  Service: Cardiovascular;  Laterality: N/A;  . Intra-aortic balloon pump insertion N/A 09/14/2013    Procedure: INTRA-AORTIC BALLOON PUMP INSERTION;  Surgeon: Jolaine Artist, MD;  Location: [facility] CATH LAB;  Service: Cardiovascular;  Laterality:  N/A;  . Right heart catheterization N/A 02/08/2014    Procedure: RIGHT HEART CATH;  Surgeon: Jolaine Artist, MD;  Location: [facility] CATH LAB;  Service: Cardiovascular;  Laterality: N/A;  . Right heart catheterization N/A 02/12/2014    Procedure: RIGHT HEART CATH;  Surgeon: Jolaine Artist, MD;  Location: [facility] CATH LAB;  Service: Cardiovascular;  Laterality: N/A;  . Left ventricular assist device     Family History  Problem Relation Age of Onset  . Heart failure Father   . Stroke Father   . Coronary artery disease Mother   . Heart disease Mother   . Hyperlipidemia Sister    Social History  Substance Use Topics  . Smoking status: Never Smoker   . Smokeless tobacco: Never Used  . Alcohol Use: No    Review of Systems All other systems negative unless otherwise stated in HPI    Allergies  Ace inhibitors; Diovan; Lipitor; and Norvasc  Home Medications   Prior to Admission medications   Medication Sig Start Date End Date Taking? Authorizing Provider  aspirin EC 325 MG tablet Take 325 mg by mouth daily.   Yes Historical Provider, MD  citalopram (CELEXA) 10 MG tablet Take 1 tablet (10 mg total) by mouth daily. 12/25/14  Yes Shaune Pascal Bensimhon, MD  Coenzyme Q10 (COQ10) 200 MG CAPS Take 200 mg by mouth daily.   Yes Historical Provider, MD  ezetimibe (ZETIA) 10 MG tablet Take 1 tablet (10 mg total) by mouth daily. 05/30/15  Yes Larey Dresser, MD  furosemide (LASIX) 20 MG tablet Take 20 mg by mouth daily as needed (Take 20 mg as daily as needed for wt > 153 lbs).   Yes Historical Provider, MD  hydrALAZINE (APRESOLINE) 25 MG tablet Take 3 tablets (75 mg total) by mouth 3 (three) times daily. 07/23/15  Yes Jolaine Artist, MD  levothyroxine (SYNTHROID) 100 MCG tablet Take 100 mcg by mouth daily before breakfast. Take 1 tab daily except 1.5 on Wed   Yes Historical Provider, MD  pantoprazole (PROTONIX) 40 MG tablet Take 1 tablet (40 mg total) by mouth daily. 01/30/15  Yes Shaune Pascal Bensimhon,  MD  potassium chloride SA (K-DUR,KLOR-CON) 20 MEQ tablet Take 1 tablet (20 mEq total) by mouth daily as needed. Take one potassium pill when you take your lasix. 01/02/15  Yes Jolaine Artist, MD  Probiotic Product (PROBIOTIC PO) Take 1 capsule by mouth daily.   Yes Historical Provider, MD  rosuvastatin (CRESTOR) 20 MG tablet Take 1 tablet (20 mg total) by mouth daily. 05/30/15  Yes Larey Dresser, MD  spironolactone (ALDACTONE) 25 MG tablet Take 1 tablet (25 mg total) by mouth daily. 07/11/14  Yes Rande Brunt, NP  temazepam (RESTORIL) 15 MG capsule TAKE ONE CAPSULE BY MOUTH AT BEDTIME 04/30/15  Yes Jolaine Artist, MD  warfarin (COUMADIN) 5 MG tablet Take 1.5 tablets every day  but Wednesday or as directed by clinic 05/20/15  Yes Jolaine Artist, MD   Pulse 85  Temp(Src) 98.2 F (36.8 C) (Oral)  Resp 18  Ht 5\' 7"  (1.702 m)  Wt 71.215 kg  BMI 24.58 kg/m2  SpO2 98% Physical Exam  Constitutional: He is oriented to person, place, and time. He appears well-developed and well-nourished.  HENT:  Head: Normocephalic. Head is with laceration. Head is without raccoon's eyes, without Battle's sign, without abrasion and without contusion.    Mouth/Throat: Oropharynx is clear and moist.  Superficial 1 cm laceration to left parietal scalp with active bleeding.  Controlled with pressure.  Eyes: Conjunctivae are normal. Pupils are equal, round, and reactive to light.  Neck: Normal range of motion. Neck supple.  No cervical midline spinous process tenderness.  No step offs or crepitus. Mildly TTP along left trapezius.  Cardiovascular:  LVAD hum.  Pulmonary/Chest: Effort normal and breath sounds normal. No accessory muscle usage or stridor. No respiratory distress. He has no wheezes. He has no rhonchi. He has no rales.  Abdominal: Soft. Bowel sounds are normal. He exhibits no distension. There is no tenderness.  Musculoskeletal: Normal range of motion.  Lymphadenopathy:    He has no cervical  adenopathy.  Neurological: He is alert and oriented to person, place, and time.  Mental Status:   AOx3.  Speech clear without dysarthria. Cranial Nerves:  I-not tested  II-PERRLA  III, IV, VI-EOMs intact  V-temporal and masseter strength intact  VII-symmetrical facial movements intact, no facial droop  VIII-hearing grossly intact bilaterally  IX, X-gag intact  XI-strength of sternomastoid and trapezius muscles 5/5  XII-tongue midline Motor:   Good muscle bulk and tone  Strength 5/5 bilaterally in upper and lower extremities   Cerebellar--RAMs, finger to nose intact  Romberg--maintains balance with eyes closed  Casual and tandem gait normal without ataxia  No pronator drift Sensory:  Intact in upper and lower extremities   Skin: Skin is warm and dry. Laceration noted.  Psychiatric: He has a normal mood and affect. His behavior is normal.    ED Course  Procedures (including critical care time)  LACERATION REPAIR Performed by: Gloriann Loan Consent: Verbal consent obtained. Risks and benefits: risks, benefits and alternatives were discussed Patient identity confirmed: provided demographic data Time out performed prior to procedure Prepped and Draped in normal sterile fashion Wound explored Laceration Location: left parietal scalp Laceration Length: 1 cm No Foreign Bodies seen or palpated Anesthesia: local infiltration Local anesthetic: lidocaine 2% with epinephrine Anesthetic total: 3 ml Irrigation method: syringe Amount of cleaning: standard Skin closure: staple Number of sutures or staples: 2 Technique: staple Patient tolerance: Patient tolerated the procedure well with no immediate complications.  Labs Review Labs Reviewed  TROPONIN I - Abnormal; Notable for the following:    Troponin I 0.13 (*)    All other components within normal limits  BASIC METABOLIC PANEL - Abnormal; Notable for the following:    Glucose, Bld 110 (*)    Creatinine, Ser 1.46 (*)     Calcium 8.7 (*)    GFR calc non Af Amer 46 (*)    GFR calc Af Amer 54 (*)    All other components within normal limits  CBC WITH DIFFERENTIAL/PLATELET  PROTIME-INR    Imaging Review Dg Chest 2 View  09/03/2015  CLINICAL DATA:  Fall this evening striking head. EXAM: CHEST  2 VIEW COMPARISON:  11/01/2014 FINDINGS: Patient is post median sternotomy. Dual lead left-sided pacemaker remains in  place. Left ventricular assist device is again seen, unchanged in position. Cardiomegaly is unchanged. Mild hyperinflation. No pulmonary edema, pneumothorax, pleural effusion or confluent airspace opacity. No acute osseous abnormalities are seen. IMPRESSION: No acute process. Stable cardiomegaly, left-sided pacemaker, and left ventricular assist device. Electronically Signed   By: Jeb Levering M.D.   On: 09/03/2015 00:58   Ct Head Wo Contrast  09/03/2015  CLINICAL DATA:  Tripped and fell 4 hours ago, with laceration at the left frontoparietal region. Patient on Coumadin. Dizziness. Initial encounter. EXAM: CT HEAD WITHOUT CONTRAST TECHNIQUE: Contiguous axial images were obtained from the base of the skull through the vertex without intravenous contrast. COMPARISON:  None. FINDINGS: There is no evidence of acute infarction, mass lesion, or intra- or extra-axial hemorrhage on CT. Prominence of the ventricles and sulci reflects mild cortical volume loss. Mild cerebellar atrophy is noted. The brainstem and fourth ventricle are within normal limits. The basal ganglia are unremarkable in appearance. The cerebral hemispheres demonstrate grossly normal gray-white differentiation. No mass effect or midline shift is seen. There is no evidence of fracture; visualized osseous structures are unremarkable in appearance. The visualized portions of the orbits are within normal limits. There is mild partial opacification of the mastoid air cells bilaterally. The paranasal sinuses are well-aerated. Soft tissue swelling is noted on the  left side near the vertex. IMPRESSION: 1. No evidence of traumatic intracranial injury or fracture. 2. Soft tissue swelling noted on the left side at the vertex. 3. Mild cortical volume loss noted. 4. Mild partial opacification of the mastoid air cells bilaterally. Electronically Signed   By: Garald Balding M.D.   On: 09/03/2015 00:48   I have personally reviewed and evaluated these images and lab results as part of my medical decision-making.   EKG Interpretation   Date/Time:  Tuesday September 03 2015 00:49:10 EST Ventricular Rate:  110 PR Interval:    QRS Duration: 178 QT Interval:  399 QTC Calculation: 540 R Axis:   -83 Text Interpretation:  Sinus rhythm Premature atrial complexes Right bundle  branch block Artifact in lead(s) II III aVR aVL aVF V1 V2 V3 V5 V6 No  significant change since last tracing Confirmed by HORTON  MD, Loma Sousa  [id number]) on 09/03/2015 12:56:47 AM      MDM   Final diagnoses:  Fall, initial encounter  Laceration    Patient presents with mechanical fall just PTA now with headache and head laceration.  Patient on LVAD and Coumadin.  Will obtain head CT.  Tylenol for pain.  VSS, NAD.  On exam, superficial laceration to left parietal scalp with bleeding controlled with pressure.  No cervical midline tenderness.  No focal neurological deficits.  No other signs of trauma.  Patient reports resolved episode of CP during a high stress event PTA as well.  Given hx will obtain EKG, troponin, and CXR.  Head CT negative for acute traumatic intracranial injury or fracture.  Soft tissue swelling on left side at vertex.  EKG shows SR with PACs, RBBB.  Unchanged from previous.  CXR negative for acute process, stable cardiomegaly with left-sided pacemaker and LVAD.  Troponin elevated at 0.13.  Per Cardiology, pt is asymptomatic, hemodynamically stable, LVAD in place without alarms, no further management necessary.  Will delta troponin.  Added CBC, BMP, and PT/INR to workup.   Anticipate d/c pending labs.  Care assumed by Dr. Dina Rich at shift change who will follow up on labs and appropriate disposition.     Gloriann Loan, PA-C  09/03/15 0141  Gloriann Loan, PA-C 09/03/15 0142  Merryl Hacker, MD 09/03/15 [phone]

## 2015-09-02 NOTE — ED Notes (Signed)
Per EDP order BP not obtained at this time, Pt with LVAD in place.

## 2015-09-03 ENCOUNTER — Emergency Department (HOSPITAL_BASED_OUTPATIENT_CLINIC_OR_DEPARTMENT_OTHER): Payer: Medicare Other

## 2015-09-03 ENCOUNTER — Telehealth: Payer: Self-pay | Admitting: Licensed Clinical Social Worker

## 2015-09-03 DIAGNOSIS — S299XXA Unspecified injury of thorax, initial encounter: Secondary | ICD-10-CM | POA: Diagnosis not present

## 2015-09-03 DIAGNOSIS — R220 Localized swelling, mass and lump, head: Secondary | ICD-10-CM | POA: Diagnosis not present

## 2015-09-03 DIAGNOSIS — S0181XA Laceration without foreign body of other part of head, initial encounter: Secondary | ICD-10-CM | POA: Diagnosis not present

## 2015-09-03 DIAGNOSIS — S0101XA Laceration without foreign body of scalp, initial encounter: Secondary | ICD-10-CM | POA: Diagnosis not present

## 2015-09-03 LAB — CBC WITH DIFFERENTIAL/PLATELET
Basophils Absolute: 0 10*3/uL (ref 0.0–0.1)
Basophils Relative: 0 %
Eosinophils Absolute: 0.2 10*3/uL (ref 0.0–0.7)
Eosinophils Relative: 2 %
HCT: 40 % (ref 39.0–52.0)
Hemoglobin: 13.1 g/dL (ref 13.0–17.0)
Lymphocytes Relative: 18 %
Lymphs Abs: 1.3 10*3/uL (ref 0.7–4.0)
MCH: 30.4 pg (ref 26.0–34.0)
MCHC: 32.8 g/dL (ref 30.0–36.0)
MCV: 92.8 fL (ref 78.0–100.0)
Monocytes Absolute: 0.7 10*3/uL (ref 0.1–1.0)
Monocytes Relative: 10 %
Neutro Abs: 5 10*3/uL (ref 1.7–7.7)
Neutrophils Relative %: 70 %
Platelets: 180 10*3/uL (ref 150–400)
RBC: 4.31 MIL/uL (ref 4.22–5.81)
RDW: 13.9 % (ref 11.5–15.5)
WBC: 7.2 10*3/uL (ref 4.0–10.5)

## 2015-09-03 LAB — BASIC METABOLIC PANEL
Anion gap: 6 (ref 5–15)
BUN: 20 mg/dL (ref 6–20)
CO2: 24 mmol/L (ref 22–32)
Calcium: 8.7 mg/dL — ABNORMAL LOW (ref 8.9–10.3)
Chloride: 107 mmol/L (ref 101–111)
Creatinine, Ser: 1.46 mg/dL — ABNORMAL HIGH (ref 0.61–1.24)
GFR calc Af Amer: 54 mL/min — ABNORMAL LOW (ref >60)
GFR calc non Af Amer: 46 mL/min — ABNORMAL LOW (ref >60)
Glucose, Bld: 110 mg/dL — ABNORMAL HIGH (ref 65–99)
Potassium: 3.6 mmol/L (ref 3.5–5.1)
Sodium: 137 mmol/L (ref 135–145)

## 2015-09-03 LAB — PROTIME-INR
INR: 2.34 — ABNORMAL HIGH (ref 0.00–1.49)
Prothrombin Time: 25.4 seconds — ABNORMAL HIGH (ref 11.6–15.2)

## 2015-09-03 LAB — TROPONIN I
Troponin I: 0.13 ng/mL — ABNORMAL HIGH (ref <0.031)
Troponin I: 0.34 ng/mL — ABNORMAL HIGH (ref <0.031)
Troponin I: 0.3 ng/mL — ABNORMAL HIGH (ref <0.031)

## 2015-09-03 MED ORDER — LIDOCAINE-EPINEPHRINE 2 %-1:100000 IJ SOLN
20.0000 mL | Freq: Once | INTRAMUSCULAR | Status: AC
  Administered 2015-09-03: 20 mL

## 2015-09-03 MED ORDER — LIDOCAINE-EPINEPHRINE 1 %-1:100000 IJ SOLN
INTRAMUSCULAR | Status: AC
Start: 2015-09-03 — End: 2015-09-03
  Filled 2015-09-03: qty 1

## 2015-09-03 NOTE — Discharge Instructions (Signed)
You were seen today for a fall. You sustained a laceration to her scalp. Follow-up with her primary physician in 7-10 days for staple removal. Your head CT is negative. You had chest pain in the setting of a stressful event earlier today. While your heart test is mildly elevated, this could be related to your LVAD.  Follow-up cardiology as soon as possible.  Laceration Care, Adult A laceration is a cut that goes through all of the layers of the skin and into the tissue that is right under the skin. Some lacerations heal on their own. Others need to be closed with stitches (sutures), staples, skin adhesive strips, or skin glue. Proper laceration care minimizes the risk of infection and helps the laceration to heal better. HOW TO CARE FOR YOUR LACERATION If sutures or staples were used:  Keep the wound clean and dry.  If you were given a bandage (dressing), you should change it at least one time per day or as told by your health care provider. You should also change it if it becomes wet or dirty.  Keep the wound completely dry for the first 24 hours or as told by your health care provider. After that time, you may shower or bathe. However, make sure that the wound is not soaked in water until after the sutures or staples have been removed.  Clean the wound one time each day or as told by your health care provider:  Wash the wound with soap and water.  Rinse the wound with water to remove all soap.  Pat the wound dry with a clean towel. Do not rub the wound.  After cleaning the wound, apply a thin layer of antibiotic ointmentas told by your health care provider. This will help to prevent infection and keep the dressing from sticking to the wound.  Have the sutures or staples removed as told by your health care provider. If skin adhesive strips were used:  Keep the wound clean and dry.  If you were given a bandage (dressing), you should change it at least one time per day or as told by your  health care provider. You should also change it if it becomes dirty or wet.  Do not get the skin adhesive strips wet. You may shower or bathe, but be careful to keep the wound dry.  If the wound gets wet, pat it dry with a clean towel. Do not rub the wound.  Skin adhesive strips fall off on their own. You may trim the strips as the wound heals. Do not remove skin adhesive strips that are still stuck to the wound. They will fall off in time. If skin glue was used:  Try to keep the wound dry, but you may briefly wet it in the shower or bath. Do not soak the wound in water, such as by swimming.  After you have showered or bathed, gently pat the wound dry with a clean towel. Do not rub the wound.  Do not do any activities that will make you sweat heavily until the skin glue has fallen off on its own.  Do not apply liquid, cream, or ointment medicine to the wound while the skin glue is in place. Using those may loosen the film before the wound has healed.  If you were given a bandage (dressing), you should change it at least one time per day or as told by your health care provider. You should also change it if it becomes dirty or wet.  If a dressing is placed over the wound, be careful not to apply tape directly over the skin glue. Doing that may cause the glue to be pulled off before the wound has healed.  Do not pick at the glue. The skin glue usually remains in place for 5-10 days, then it falls off of the skin. General Instructions  Take over-the-counter and prescription medicines only as told by your health care provider.  If you were prescribed an antibiotic medicine or ointment, take or apply it as told by your doctor. Do not stop using it even if your condition improves.  To help prevent scarring, make sure to cover your wound with sunscreen whenever you are outside after stitches are removed, after adhesive strips are removed, or when glue remains in place and the wound is healed.  Make sure to wear a sunscreen of at least 30 SPF.  Do not scratch or pick at the wound.  Keep all follow-up visits as told by your health care provider. This is important.  Check your wound every day for signs of infection. Watch for:  Redness, swelling, or pain.  Fluid, blood, or pus.  Raise (elevate) the injured area above the level of your heart while you are sitting or lying down, if possible. SEEK MEDICAL CARE IF:  You received a tetanus shot and you have swelling, severe pain, redness, or bleeding at the injection site.  You have a fever.  A wound that was closed breaks open.  You notice a bad smell coming from your wound or your dressing.  You notice something coming out of the wound, such as wood or glass.  Your pain is not controlled with medicine.  You have increased redness, swelling, or pain at the site of your wound.  You have fluid, blood, or pus coming from your wound.  You notice a change in the color of your skin near your wound.  You need to change the dressing frequently due to fluid, blood, or pus draining from the wound.  You develop a new rash.  You develop numbness around the wound. SEEK IMMEDIATE MEDICAL CARE IF:  You develop severe swelling around the wound.  Your pain suddenly increases and is severe.  You develop painful lumps near the wound or on skin that is anywhere on your body.  You have a red streak going away from your wound.  The wound is on your hand or foot and you cannot properly move a finger or toe.  The wound is on your hand or foot and you notice that your fingers or toes look pale or bluish.   This information is not intended to replace advice given to you by your health care provider. Make sure you discuss any questions you have with your health care provider.   Document Released: 08/17/2005 Document Revised: 01/01/2015 Document Reviewed: 08/13/2014 Elsevier Interactive Patient Education [employer].

## 2015-09-03 NOTE — ED Notes (Signed)
Assumed care of patient from [place], [place]. Pt resting quietly. PA at bedside for suture placement. VSS. No distress. Call bell within reach. Will monitor.

## 2015-09-03 NOTE — Telephone Encounter (Signed)
CSW contacted patient's wife to thank for support of another VAD patient thru support group. Wife appreciative of call and shared that her colitis has returned and she is not feeling well. She also shared patient's recent emergency room visit and the challenges with her illness. Wife spoke at length about recent holidays and upcoming medical issues for her. CSW provided supportive intervention and will continue to be available for both patient and wife. Raquel Sarna, [place]

## 2015-09-03 NOTE — ED Notes (Signed)
[facility] called to page dr Aundra Dubin for dr Dina Rich about lvad

## 2015-09-03 NOTE — ED Provider Notes (Signed)
Medical screening examination/treatment/procedure(s) were conducted as a shared visit with non-physician practitioner(s) and myself.  I personally evaluated the patient during the encounter.   EKG Interpretation   Date/Time:  Tuesday September 03 2015 00:49:10 EST Ventricular Rate:  110 PR Interval:    QRS Duration: 178 QT Interval:  399 QTC Calculation: 540 R Axis:   -83 Text Interpretation:  Sinus rhythm Premature atrial complexes Right bundle  branch block Artifact in lead(s) II III aVR aVL aVF V1 V2 V3 V5 V6 No  significant change since last tracing Confirmed by HORTON  MD, COURTNEY  [id number]) on 09/03/2015 12:56:47 AM      Patient presents with laceration to the forehead. Patient reports mechanical fall where he tripped over some clothing and sustained a laceration to his scalp. Denies loss of consciousness. Denies neck pain. Patient also reports that on his way here, he had a very stressful interaction as a result of police interrogation. He states that he had some chest pain at this time. Chest pain is resolved. When asked to qualify the chest pain, patient states "I thought they were going to shoot me." EKG is grossly unchanged from prior. Chest x-ray is reassuring. Troponin is mildly elevated at 0.13. He has previously had a negative troponin s/p LVAD placement. Given his extensive cardiac history, will consult cardiology. Additional lab work obtained. CT head negative for bleed. Laceration repaired at the bedside by the PA.  Dr. Aundra Dubin on for LVAD.  Unclear what an ultrasensitive troponin means in a patient who is currently asymptomatic. Will delta troponin.  3:51 AM Repeat troponin 0.34. Discussed again with Dr. Aundra Dubin.  Patient continues to be asymptomatic.  Per Dr. Benjamine Mola, troponin could be elevated for any number of reasons in an LVAD patient. He requests repeat troponin at 6 AM for trending purposes. If stable or declining, patient can be discharged home. Discussed this with the  patient who has agreed to stay. He continues to be pain-free.  6:33 AM Repeat troponin is down trending at 0.30. Discussed this with the patient.  He will call cardiology he will call cardiology for urgent follow-up. Follow-up primary physician in 7-10 days for staple removal.  Merryl Hacker, MD 09/03/15 [phone]

## 2015-09-03 NOTE — ED Notes (Signed)
No discharge BP obtained per EDP order not to obtain due to LVAD.

## 2015-09-03 NOTE — ED Notes (Signed)
MD at bedside. 

## 2015-09-10 ENCOUNTER — Telehealth: Payer: Self-pay | Admitting: Infectious Diseases

## 2015-09-10 ENCOUNTER — Other Ambulatory Visit (HOSPITAL_COMMUNITY): Payer: Self-pay | Admitting: *Deleted

## 2015-09-10 DIAGNOSIS — Z7901 Long term (current) use of anticoagulants: Secondary | ICD-10-CM

## 2015-09-10 DIAGNOSIS — Z95811 Presence of heart assist device: Secondary | ICD-10-CM

## 2015-09-10 NOTE — Telephone Encounter (Signed)
-----   Message from Effie Berkshire, RN sent at 09/10/2015 12:35 PM EST ----- [name] called to speak to one of yall, did not specify what! [phone]

## 2015-09-10 NOTE — Telephone Encounter (Signed)
Called Johnny Oneill back--wondering if we can remove Johnny Oneill's staples at clinic visit tomorrow. Placed on 09/02/15 and instructions advised to leave in place 7 - 10 days. Advised we can remove if they appear ready to come out.

## 2015-09-11 ENCOUNTER — Ambulatory Visit (HOSPITAL_COMMUNITY)
Admission: RE | Admit: 2015-09-11 | Discharge: 2015-09-11 | Disposition: A | Discharge status: Home / Self Care | Payer: Medicare Other | Source: Ambulatory Visit | Attending: Internal Medicine | Admitting: Internal Medicine

## 2015-09-11 ENCOUNTER — Ambulatory Visit (HOSPITAL_COMMUNITY): Payer: Self-pay | Admitting: *Deleted

## 2015-09-11 ENCOUNTER — Encounter: Payer: Self-pay | Admitting: Licensed Clinical Social Worker

## 2015-09-11 VITALS — BP 90/0 | HR 86 | Ht 69.0 in

## 2015-09-11 DIAGNOSIS — I252 Old myocardial infarction: Secondary | ICD-10-CM | POA: Insufficient documentation

## 2015-09-11 DIAGNOSIS — I10 Essential (primary) hypertension: Secondary | ICD-10-CM

## 2015-09-11 DIAGNOSIS — Z95811 Presence of heart assist device: Secondary | ICD-10-CM

## 2015-09-11 DIAGNOSIS — Z7901 Long term (current) use of anticoagulants: Secondary | ICD-10-CM

## 2015-09-11 DIAGNOSIS — Z4802 Encounter for removal of sutures: Secondary | ICD-10-CM | POA: Diagnosis not present

## 2015-09-11 DIAGNOSIS — Z7982 Long term (current) use of aspirin: Secondary | ICD-10-CM | POA: Insufficient documentation

## 2015-09-11 DIAGNOSIS — I255 Ischemic cardiomyopathy: Secondary | ICD-10-CM | POA: Insufficient documentation

## 2015-09-11 DIAGNOSIS — Z955 Presence of coronary angioplasty implant and graft: Secondary | ICD-10-CM | POA: Insufficient documentation

## 2015-09-11 DIAGNOSIS — Z79899 Other long term (current) drug therapy: Secondary | ICD-10-CM | POA: Diagnosis not present

## 2015-09-11 DIAGNOSIS — E7800 Pure hypercholesterolemia, unspecified: Secondary | ICD-10-CM | POA: Insufficient documentation

## 2015-09-11 DIAGNOSIS — I2510 Atherosclerotic heart disease of native coronary artery without angina pectoris: Secondary | ICD-10-CM | POA: Diagnosis not present

## 2015-09-11 DIAGNOSIS — E039 Hypothyroidism, unspecified: Secondary | ICD-10-CM | POA: Diagnosis not present

## 2015-09-11 DIAGNOSIS — I110 Hypertensive heart disease with heart failure: Secondary | ICD-10-CM | POA: Insufficient documentation

## 2015-09-11 DIAGNOSIS — I5022 Chronic systolic (congestive) heart failure: Secondary | ICD-10-CM | POA: Diagnosis not present

## 2015-09-11 LAB — COMPREHENSIVE METABOLIC PANEL
ALT: 27 U/L (ref 17–63)
AST: 35 U/L (ref 15–41)
Albumin: 4.2 g/dL (ref 3.5–5.0)
Alkaline Phosphatase: 87 U/L (ref 38–126)
Anion gap: 11 (ref 5–15)
BUN: 25 mg/dL — ABNORMAL HIGH (ref 6–20)
CO2: 24 mmol/L (ref 22–32)
Calcium: 9.5 mg/dL (ref 8.9–10.3)
Chloride: 105 mmol/L (ref 101–111)
Creatinine, Ser: 1.52 mg/dL — ABNORMAL HIGH (ref 0.61–1.24)
GFR calc Af Amer: 51 mL/min — ABNORMAL LOW (ref >60)
GFR calc non Af Amer: 44 mL/min — ABNORMAL LOW (ref >60)
Glucose, Bld: 103 mg/dL — ABNORMAL HIGH (ref 65–99)
Potassium: 4.5 mmol/L (ref 3.5–5.1)
Sodium: 140 mmol/L (ref 135–145)
Total Bilirubin: 1.1 mg/dL (ref 0.3–1.2)
Total Protein: 7.2 g/dL (ref 6.5–8.1)

## 2015-09-11 LAB — CBC
HCT: 43.9 % (ref 39.0–52.0)
Hemoglobin: 14.1 g/dL (ref 13.0–17.0)
MCH: 30 pg (ref 26.0–34.0)
MCHC: 32.1 g/dL (ref 30.0–36.0)
MCV: 93.4 fL (ref 78.0–100.0)
Platelets: 189 10*3/uL (ref 150–400)
RBC: 4.7 MIL/uL (ref 4.22–5.81)
RDW: 14.5 % (ref 11.5–15.5)
WBC: 5.5 10*3/uL (ref 4.0–10.5)

## 2015-09-11 LAB — PROTIME-INR
INR: 2.21 — ABNORMAL HIGH (ref 0.00–1.49)
Prothrombin Time: 24.3 seconds — ABNORMAL HIGH (ref 11.6–15.2)

## 2015-09-11 LAB — LACTATE DEHYDROGENASE: LDH: 213 U/L — ABNORMAL HIGH (ref 98–192)

## 2015-09-11 NOTE — Patient Instructions (Signed)
1.  No change in meds. 2.  No change in coumadin dose; re-check in two weeks on 09/25/15 at [facility]. 3.  Return to [place] clinic in 2 months.

## 2015-09-11 NOTE — Progress Notes (Signed)
Johnny Oneill comes to clinic with his wife.   Symptom Yes No Details  Angina  x Activity:  Claudication  x How far:    Syncope  x When:  rarely with orthostatic changes  Stroke  x   Orthopnea  x How many pillows: 1  PND  x How often:  CPAP  n/a How many hrs:  Pedal edema  x   Abd fullness  x   N&V  x Good appetite; full meal   Diaphoresis  x When:  Bleeding x  Nosebleeds when not using humidifier  Urine  x Clear yellow   SOB  x Activity:  Palpitations  x When: with stress  ICD shock  x   Hospitlizaitons  x When/where/why:  ED visit x x When/where/why: 09/03/15 fall with head laceration  Other MD x  When/who/why:     Activity  x No limitations   Fluid  x  about 2 L   Diet  x  No limitaions   Doppler MAP:  90 Automatic BP:   93/54 (78) Weight:  154.2  lbs Last Weight: 162.4  lbs HR:  86 SPO2: 98 %  Last device check per Dr. Rayann Heman revealed Medtronic dual ICD at [facility]. Pt was 40% a-pacing at that time with recommendation per Dr. Rayann Heman to monitor patient for decompensation. Pt denies fatigue, dizziness, lightheadedness, or syncopal episodes.  EKG obtained at ED visit last week and revealed SR with PAC and RBB; reviewed by Dr. Haroldine Laws.    VAD interrogation revealed: Speed:  9200 Flow: 4.4 Power: 5.1 PI: 6.0 Alarms: none Events: 0 - 5 PI events Fixed speed: 9200 Low speed limit: 8600 Primary Controller:  Replace back up battery in 9 months.  Back up controller:   Replace back up battery in 8 months.   I reviewed the LVAD parameters from today, and compared the results to the patient's prior recorded data.  No programming changes were made. The LVAD is functioning within specified parameters.  The patient performs LVAD self-test daily.  LVAD interrogation shows no change in PI events, power surges, or speed drops. LVAD equipment check completed and is in good working order. Back-up equipment not present.   Drive Line: Reports no further itching or rash. Sorbaview dressing dry  and intact with anchor in place and accurately applied. Dressing changes are being performed weekly using Sorbaview dressing with biopatch and cleansing with SALINE WIPES (CHG allergy). Dressing supplies provided for home use.   Medtronic rep here for full ICD interrogation; reviewed by Dr. Haroldine Laws.    Reviewed and demonstrated the following to patient and caregiver:               Reviewed the steps for replacing the running system controller with the back-up system controller (see patient handbook section 2 or the appropriate pamphlet).             X  Demonstrated (using the mock-driveline and controller) how to connect and disconnect the mock-driveline in back up system controller in a timely manner (less than 10 seconds) with return demonstration by patient and caregiver.              X   Reviewed system controller alarms and troubleshooting including hazard and advisory alarms and accessing alarm history. Re-enforced NOT TO attempt to perform any task displayed on the display screen alone or without calling the VAD pager [phone] (see patient handbook section 5 or the appropriate pamphlet).  X  Reviewed how to handle an emergency including when the pump is running and when the pump has stopped (see patient handbook section 8). Call 911 first and then the VAD pager at [phone].             X   Reviewed 14-volt lithium ion battery calibration steps (see patient handbook section 3).            X   Reviewed contents of black bag and what must be with patient at all times.            X  Reviewed driveline exit site including cleansing, dressing, and immobilizing with an anchor device to prevent exit site trauma.             X  Reviewed weekly maintenance which includes: Reviewing "Replacing the [place] with a [employer]" pamphlet. Clean batteries, clips, and battery charger contacts.  Check cables for damages. Rotate batteries; keep  all 8 charged.              X  Reviewed monthly maintenance which includes: Reviewing Alarms and Troubleshooting. Check battery manufacturer dates. Check use/charge cycles for each battery; remember to re-calibrate every 70 uses when prompted.              X  Additional pamphlets Guide to Replacing the [place] with the [place] for Patients and Their Caregivers and HM II Alarms for Patients and Their Caregivers given to patient.              X   Batteries Manufacture Date: Number of uses: Re-calibration  Jan 2014 162 Performed by patient   Annual maintenance completed per Marcie Mowers on patient's home power module and Electrical engineer.    Backup system controller 11 volt battery charged during visit.   2 year Intermacs follow up completed including:  Quality of Life, KCCQ-12, and Neurocognitive trail making.   Pt completed 1500 feet during 6 minute walk with no symptoms.    Patient Instructions: 1.  No change in meds. 2.  No change in coumadin dose; re-check in two weeks on 09/25/15 at [facility]. 3.  Return to [place] clinic in 2 months.  Zada Girt, RN VAD Coordinator  Office: [phone] 24/7 VAD Pager: [phone]

## 2015-09-13 ENCOUNTER — Telehealth (HOSPITAL_COMMUNITY): Payer: Self-pay | Admitting: Infectious Diseases

## 2015-09-13 NOTE — Telephone Encounter (Signed)
Call received from [place] re: Johnny Oneill with dizziness and orthostatic symptoms that have started today. She dopplers his BP at home and reports 85 before medication--which she reports is low for him. Rx'd 75 mg TID for hydralazine and 25 mg QD of spironolactone. She is only going to give him 50 mg TID today and check his BP later. PI reported to be 7.   Advised her to have him try and slow down and switch positions cautiously and drink extra fluids today. Denies any illness or other changes aside from a 7 lb weight loss (she attributes to a URI and not eating as much sweets since after Christmas).   Also advised that should he remain symptomatic over next few days and we may need to consider changing regimen. Verbalized she will continue to monitor him and should he have an escalation of symptoms she will page me over the weekend.   Janene Madeira, RN VAD Coordinator   Office: [phone] 24/7 VAD Pager: [phone]

## 2015-09-13 NOTE — Telephone Encounter (Signed)
Would decrease hydral first if needed and see how he responds

## 2015-09-13 NOTE — Progress Notes (Signed)
VAD CLINIC NOTE  Patient ID: SAHR CHAPNICK, male   DOB: 08/03/1941, 75 y.o.   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   Admitted 3/4-3/25/15 with SOB and syncopal episode. Found to have SBO and NG placed and started on TPN. On 11/06/13 developed progressive SOB and hypotension and co-ox 51% and Hgb 6.7. Started on milrinone and transfused. Echo showed nl RV function. Patient had PEA arrest and was intubated and started on pressors. CXR revelaed a R hemothorax and a R CT placed. He required VATs  and evacuation of hemothorax and then tansferred to CIR.    Follow up for Heart Failure: Overall doing very well. Denies SOB/PND/Orthopnea/edema. Has gained a few pounds but attributes it to his appetite.  No bleeding problems.  Seen by Dr. Rayann Heman and device at [facility]. No plans to replace.  Had a fall 09/03/15 with scalp laceration Evaluated in ER and got staples.   Echo today showed mild LV dilation, EF 20%, LVAD cannula apically, moderately RV dilation with mild to moderately decreased RV systolic function.   Labs 03/05/2015 INR 2.1, Hgb 14.2, WBC 6, LDH 199, K 3.5, creatinine 1.38, BNP 230   Past Medical History  Diagnosis Date  . Ischemic cardiomyopathy     a. 10/09 Echo: Sev LV Dysfxn, inf/lat AK, Mod MR.  [name];  s/p Lat MI 2009 with occlusion of the LCX - treated with Promus stenting. Has total occlusion of the RCA.   . ICD (implantable cardiac defibrillator) in place     PROPHYLACTIC      medtronic  . RBBB   . Inferior MI ([place]) "? date"; 2009  . Hypertension   . PVC (premature ventricular contraction)   . Pacemaker   . CHF (congestive heart failure) ([place])   . Shortness of breath   . Automatic  implantable cardioverter-defibrillator in situ   . LVAD (left ventricular assist device) present [facility])     Current Outpatient Prescriptions  Medication Sig Dispense Refill  . aspirin EC 325 MG tablet Take 325 mg by mouth daily.    . citalopram (CELEXA) 10 MG tablet Take 1 tablet (10 mg total) by mouth daily. 90 tablet 2  . Coenzyme Q10 (COQ10) 200 MG CAPS Take 200 mg by mouth daily.    [name] ezetimibe (ZETIA) 10 MG tablet Take 1 tablet (10 mg total) by mouth daily. 30 tablet 3  . furosemide (LASIX) 20 MG tablet Take 20 mg by mouth daily as needed (Take 20 mg as daily as needed for wt > 153 lbs).    . hydrALAZINE (APRESOLINE) 25 MG tablet Take 3 tablets (75 mg total) by mouth 3 (three) times daily. 270 tablet 11  . levothyroxine (SYNTHROID) 100 MCG tablet Take 100 mcg by mouth daily before breakfast. Take 1 tab daily except 1.5 on Wed    . pantoprazole (PROTONIX) 40 MG tablet Take 1 tablet (40 mg total) by mouth daily. 30 tablet 6  . potassium chloride SA (K-DUR,KLOR-CON) 20 MEQ tablet Take 1 tablet (20 mEq total) by mouth daily as needed. Take one potassium pill when you take your lasix. 90 tablet 3  .  Probiotic Product (PROBIOTIC PO) Take 1 capsule by mouth daily.    . rosuvastatin (CRESTOR) 20 MG tablet Take 1 tablet (20 mg total) by mouth daily. 90 tablet 3  . spironolactone (ALDACTONE) 25 MG tablet Take 1 tablet (25 mg total) by mouth daily. 90 tablet 3  . temazepam (RESTORIL) 15 MG capsule TAKE ONE CAPSULE BY MOUTH AT BEDTIME 30 capsule 0  . warfarin (COUMADIN) 5 MG tablet Take 1.5 tablets every day but Wednesday or as directed by clinic 60 tablet 12   No current facility-administered medications for this encounter.   Facility-Administered Medications Ordered in Other Encounters  Medication Dose Route Frequency Provider Last Rate Last Dose  . etomidate (AMIDATE) injection    Anesthesia Intra-op Sammuel Cooper Mumm, CRNA   20 mg at 02/08/14 0915  . rocuronium [facility]) injection    Anesthesia  Intra-op Sammuel Cooper Mumm, CRNA   50 mg at 02/08/14 0932  . succinylcholine (ANECTINE) injection    Anesthesia Intra-op Sammuel Cooper Mumm, CRNA   100 mg at 02/08/14 0915    Ace inhibitors; Diovan; Lipitor; and Norvasc  REVIEW OF SYSTEMS: All systems negative except as listed in HPI, PMH and Problem list.    VAD interrogation revealed: Speed: 9200 Flow: 4.4 Power: 5.1 PI: 6.0 Alarms: none Events: 0 - 5 PI events Fixed speed: 9200 Low speed limit: 8600 Primary Controller: Replace back up battery in 9 months.  Back up controller: Replace back up battery in 8 months.                                   I reviewed the LVAD parameters from today, and compared the results to the patient's prior recorded data.  No programming changes were made.  The LVAD is functioning within specified parameters.  The patient performs LVAD self-test daily.  LVAD interrogation was negative for any significant power changes, alarms or PI events/speed drops.  LVAD equipment check completed and is in good working order.  Back-up equipment present.   LVAD education done on emergency procedures and precautions and reviewed exit site care.    Filed Vitals:   09/11/15 1117 09/11/15 1118  BP: 93/54 90/0  Pulse: 86   Height: 5\' 9"  (1.753 m)   SpO2: 98%    Doppler MAP: 90 Automatic BP: 93/54 (78) Weight: 154.2 lbs Last Weight: 162.4 lbs HR: 86 SPO2: 98 %   Physical Exam: GENERAL: Well appearing, male who presents to clinic today in no acute distress; Wife present HEENT: normal x for laceration on scalp. Staples removed in clinic NECK: Supple, JVP 5-6  no bruits. Prominent CV waves  No lymphadenopathy or thyromegaly appreciated.    CARDIAC:  Mechanical heart sounds with LVAD hum present.  LUNGS:  Clear to auscultation bilaterally.  ABDOMEN:  Soft, round, nontender, positive bowel sounds x4. Mild distension. nontender LVAD exit site: well-healed and semi incorporated.  Dressing dry and intact.  Stabilization device present and accurately applied.  Driveline dressing is being changed weekly per sterile technique. EXTREMITIES:  Warm and dry, no cyanosis, clubbing, rash.  No edema.  NEUROLOGIC:  Alert and oriented x 4.  Gait steady.  No aphasia.  No dysarthria.  Affect pleasant.      ASSESSMENT AND PLAN:   1) Chronic systolic HF: ICM, EF 0000000 s/p LVAD HM II implant 08/2013 for DT. NYHA II symptoms. Volume status stable. Continue Lasix prn - Doing well. 2-year anniversary  testing today. - MAP ok.  Continue current regimen.  - He has not tolerated ACE-I or amlodipine in the past. - ICD at [facility]. No plans to replace. He atrial paces 40%. We will need to follow closely to make sure that he does not decompensate with loss of atrial pacing. He is not on any negative chronotropic agents currently.  2) LVAD: HMII 08/2013 for DT.  Doing well. LVAD parameters stable. 3) Chronic anticoagulation: -  INR goal 2.0-3.0. INR today 2.2  No bleeding issues.   - Continue ASA 325 mg for LVAD. 4) HTN: MAP 80-90. Continue current meds.  5) Hypothyroidism: Per Dr Dwyane Dee. Continue synthroid 150 mcg.    Glori Bickers  MD  09/13/2015

## 2015-09-16 ENCOUNTER — Other Ambulatory Visit (HOSPITAL_COMMUNITY): Payer: Self-pay | Admitting: Internal Medicine

## 2015-09-16 ENCOUNTER — Other Ambulatory Visit (HOSPITAL_COMMUNITY): Payer: Self-pay | Admitting: Anesthesiology

## 2015-09-16 ENCOUNTER — Telehealth (HOSPITAL_COMMUNITY): Payer: Self-pay | Admitting: Infectious Diseases

## 2015-09-16 NOTE — Addendum Note (Signed)
Addended by: [place] Callas on: 09/16/2015 02:16 PM   Modules accepted: Medications

## 2015-09-16 NOTE — Telephone Encounter (Signed)
Called back and D/W Molly. Continuing on 50 mg TID for Hydralazine. Doppler BP 90 - 100 (which is likely his modified SBP based on previous encounters) and he feels much better on this reduced dose. Advised to continue based on how he feels and Dr. Clayborne Dana recommendation.

## 2015-09-16 NOTE — Telephone Encounter (Signed)
Called LVM to check on how Johnny Oneill is doing based on last phone conversation where she decreased his hydralazine to 50 mg TID from 75 mg TID. Will try back later.   Janene Madeira, RN VAD Coordinator   Office: [phone] 24/7 VAD Pager: [phone]

## 2015-09-18 NOTE — Progress Notes (Signed)
LVAD Reassessment Psychosocial Screening  Date Initiated:  09/11/2015 LVAD Implant date:  09/18/2013 Annual:  19yr 66-month:  Name:  Johnny Oneill Address:  [place] [address] Home Phone:  [phone] (home) Cell:   Telephone Information:  Mobile [phone]   Marital Status:  Married Faith:  Language:  English DOB:  1940-11-15   Medical & Follow-up Adherence to Medical regimen/INR checks:  compliant Medication adherence:  compliant Physician/Clinic Appointment Attendance: compliant Do you smoke?  no Do you drink alcohol?  Not at all Have you ever used illegal drugs or misused medications? no  Any changes to your medical history since receiving the LVAD? no  Do you have needed supplies for care of LVAD?  yes Do you have a charge/co-pay for supplies/INR visits?   Emergency Plan for electrical outage?  yes Do you drive?  yes What is your plan for transportation? Self or wife [name]  Advance Directives: <no information> Patient and wife report Advanced Directives completed  Psychological Health Appearance:   Well groomed Mental Status:  Alert and oriented Eye Contact: excellent  Thought Content: clear and coherent   Speech: clear    Mood:  stable Affect: upbeat  Insight: good  Judgement: sound  Interaction Style:  Positive and appropriate   Family/Social Information Who lives in your home? Name:   Relationship:  Phone: [name]   wife Other family members/support persons in your life? Name:   Relationship:  Phone: [name]  neighbor Caregiving Needs Who is the primary caregiver? Name: [name] Who is the secondary caregiver? Name: none Self-care: independent Ambulation: independent Limitations: unable to referee basketball Any barriers impacting ability to participate in care? none Who does your driveline site dressing changes?   [name] How often are dressings changed?   weekly Community Are you active with community  agencies/resources/homecare?no Are you active in a church, synagogue, mosque or other faith based community? yes What other sources do you have for spiritual support? Church community Are you active in any clubs or social organizations? no What do you do for fun?  Hobbies?  Interests? Travel, beach with grand kids, antique and gun shows  Mental Health History  How have you been feeling in the past year? fine Do you have any concerns with body image with the LVAD implants?no How do you handle stressful situations? Work at [employer] and go out and ride Patient stated coping strategies:   Are there any other stressors in your life?no Have you had any past or current thoughts of suicide? no How many hours do you sleep at night? 6-8 hours How is your appetite? good   Hospital Anxiety and Depression Screen (HADS) score:  6 Do you need to see a counselor, psychiatrist or therapist?   no Do you attend or wish to attend the LVAD support group?  Attends regularly  Patient stated explanation how their life has improved as a result of receiving the LVAD:  "feel like I am 19" no SOB Patient's biggest concern or fear about living with the LVAD:   power going out Patient stated barriers to living with the LVAD: can't referee Patient feels they had adequate support from the [place] team?  Yes!  Caregiver questions How has your life been affected since your spouse/partner received the LVAD?  Added stress but more accepting everyday  Caregiver stated biggest concern or fear about caregiving with an LVAD patient:  Keeping myself well Caregiver stated coping strategies for concerns and fears: pray Caregiver stated barriers to  caregiving: none Caregiver feels receives adequate support from the VAD team? Yes!   Clinical Intervention Needed: CSW will continue to follow for supportive needs and any other issues as they arise. CSW encouraged continued involvement with the LVAD Support group.  Clinical  Impressions/Recommendations: Patient appears to have adjusted well to life with an LVAD. He and his wife have become active members of the support group and are always eager for welcome and support new members. Both have assisted with visitation and Buddies for newly implanted VAD patients.   Louann Liv, LCSW

## 2015-09-23 ENCOUNTER — Other Ambulatory Visit (HOSPITAL_COMMUNITY): Payer: Self-pay | Admitting: *Deleted

## 2015-09-23 ENCOUNTER — Other Ambulatory Visit (HOSPITAL_COMMUNITY): Payer: Self-pay | Admitting: Internal Medicine

## 2015-09-23 DIAGNOSIS — I10 Essential (primary) hypertension: Secondary | ICD-10-CM

## 2015-09-23 DIAGNOSIS — I5022 Chronic systolic (congestive) heart failure: Secondary | ICD-10-CM

## 2015-09-23 MED ORDER — SPIRONOLACTONE 25 MG PO TABS: 25.0000 mg | ORAL_TABLET | Freq: Every day | ORAL | Status: DC

## 2015-09-25 ENCOUNTER — Other Ambulatory Visit (INDEPENDENT_AMBULATORY_CARE_PROVIDER_SITE_OTHER): Payer: Medicare Other | Admitting: *Deleted

## 2015-09-25 ENCOUNTER — Ambulatory Visit (HOSPITAL_COMMUNITY): Payer: Self-pay | Admitting: Infectious Diseases

## 2015-09-25 DIAGNOSIS — I10 Essential (primary) hypertension: Secondary | ICD-10-CM | POA: Diagnosis not present

## 2015-09-25 DIAGNOSIS — I2583 Coronary atherosclerosis due to lipid rich plaque: Principal | ICD-10-CM

## 2015-09-25 DIAGNOSIS — I2510 Atherosclerotic heart disease of native coronary artery without angina pectoris: Secondary | ICD-10-CM

## 2015-09-25 DIAGNOSIS — Z5181 Encounter for therapeutic drug level monitoring: Secondary | ICD-10-CM

## 2015-09-25 DIAGNOSIS — Z7901 Long term (current) use of anticoagulants: Secondary | ICD-10-CM

## 2015-09-25 LAB — PROTIME-INR
INR: 2.59 — ABNORMAL HIGH (ref <1.50)
Prothrombin Time: 28.1 seconds — ABNORMAL HIGH (ref 11.6–15.2)

## 2015-09-25 NOTE — Addendum Note (Signed)
Addended by: Eulis Foster on: 09/25/2015 11:15 AM   Modules accepted: Orders

## 2015-09-30 ENCOUNTER — Other Ambulatory Visit (HOSPITAL_COMMUNITY): Payer: Self-pay | Admitting: Internal Medicine

## 2015-09-30 ENCOUNTER — Other Ambulatory Visit (HOSPITAL_COMMUNITY): Payer: Self-pay | Admitting: Cardiology

## 2015-10-02 ENCOUNTER — Encounter: Payer: Self-pay | Admitting: *Deleted

## 2015-10-07 ENCOUNTER — Telehealth: Payer: Self-pay | Admitting: Internal Medicine

## 2015-10-07 NOTE — Telephone Encounter (Signed)
New Message:  Pt called in wanting to inform dr. Jackalyn Lombard nurse that the battery in the pt's pacemaker has died and that is why he has been unable to send a transmission. If you have any other questions please call pt

## 2015-10-07 NOTE — Telephone Encounter (Signed)
Per phone note 07/09/15:  Pt has a heart pump. His pacemaker battery is end of life. He is not going to have it replaced---please call wife and let her know you will take him off the list and let her know what to do with the box

## 2015-10-08 ENCOUNTER — Other Ambulatory Visit: Payer: Self-pay | Admitting: Endocrinology

## 2015-10-08 NOTE — Telephone Encounter (Signed)
Spoke w/ pt wife and she informed her that she had already returned pt home monitor. Informed pt wife that pt should not receive any more letters. She verbalized understanding.

## 2015-10-09 ENCOUNTER — Other Ambulatory Visit (INDEPENDENT_AMBULATORY_CARE_PROVIDER_SITE_OTHER): Payer: Medicare Other

## 2015-10-09 DIAGNOSIS — I2129 ST elevation (STEMI) myocardial infarction involving other sites: Secondary | ICD-10-CM

## 2015-10-09 DIAGNOSIS — I2589 Other forms of chronic ischemic heart disease: Secondary | ICD-10-CM | POA: Diagnosis not present

## 2015-10-09 DIAGNOSIS — I255 Ischemic cardiomyopathy: Secondary | ICD-10-CM

## 2015-10-09 LAB — PROTIME-INR
INR: 2.62 — ABNORMAL HIGH (ref <1.50)
Prothrombin Time: 28.4 seconds — ABNORMAL HIGH (ref 11.6–15.2)

## 2015-10-10 ENCOUNTER — Other Ambulatory Visit: Payer: Self-pay | Admitting: Endocrinology

## 2015-10-10 ENCOUNTER — Ambulatory Visit (HOSPITAL_COMMUNITY): Payer: Self-pay | Admitting: *Deleted

## 2015-10-10 DIAGNOSIS — Z7901 Long term (current) use of anticoagulants: Secondary | ICD-10-CM

## 2015-10-10 DIAGNOSIS — Z95811 Presence of heart assist device: Secondary | ICD-10-CM

## 2015-10-11 ENCOUNTER — Telehealth: Payer: Self-pay | Admitting: Endocrinology

## 2015-10-11 NOTE — Telephone Encounter (Signed)
Patient ask if he could get a refill of his medication until his next appt

## 2015-10-23 ENCOUNTER — Other Ambulatory Visit (INDEPENDENT_AMBULATORY_CARE_PROVIDER_SITE_OTHER): Payer: Medicare Other | Admitting: *Deleted

## 2015-10-23 ENCOUNTER — Ambulatory Visit (HOSPITAL_COMMUNITY): Payer: Self-pay | Admitting: *Deleted

## 2015-10-23 DIAGNOSIS — Z95811 Presence of heart assist device: Secondary | ICD-10-CM | POA: Diagnosis not present

## 2015-10-23 LAB — PROTIME-INR
INR: 2.48 — ABNORMAL HIGH (ref <1.50)
Prothrombin Time: 27.2 seconds — ABNORMAL HIGH (ref 11.6–15.2)

## 2015-10-28 ENCOUNTER — Other Ambulatory Visit (HOSPITAL_COMMUNITY): Payer: Self-pay | Admitting: Cardiology

## 2015-11-01 ENCOUNTER — Other Ambulatory Visit (HOSPITAL_COMMUNITY): Payer: Self-pay | Admitting: *Deleted

## 2015-11-01 ENCOUNTER — Other Ambulatory Visit: Payer: Self-pay | Admitting: Internal Medicine

## 2015-11-01 MED ORDER — TEMAZEPAM 15 MG PO CAPS: 15.0000 mg | ORAL_CAPSULE | Freq: Every day | ORAL | Status: DC

## 2015-11-06 ENCOUNTER — Ambulatory Visit (INDEPENDENT_AMBULATORY_CARE_PROVIDER_SITE_OTHER): Payer: Medicare Other | Admitting: Endocrinology

## 2015-11-06 ENCOUNTER — Encounter: Payer: Self-pay | Admitting: Endocrinology

## 2015-11-06 VITALS — HR 39 | Temp 97.9°F | Resp 14 | Ht 67.0 in | Wt 157.6 lb

## 2015-11-06 DIAGNOSIS — E890 Postprocedural hypothyroidism: Secondary | ICD-10-CM | POA: Diagnosis not present

## 2015-11-06 DIAGNOSIS — I255 Ischemic cardiomyopathy: Secondary | ICD-10-CM | POA: Diagnosis not present

## 2015-11-06 LAB — TSH: TSH: 0.14 u[IU]/mL — ABNORMAL LOW (ref 0.35–4.50)

## 2015-11-06 LAB — T4, FREE: Free T4: 1.64 ng/dL — ABNORMAL HIGH (ref 0.60–1.60)

## 2015-11-06 NOTE — Progress Notes (Signed)
Patient ID: Johnny Oneill, male   DOB: 03/26/1941, 75 y.o.   MRN: [id number]   Reason for Appointment:  Hypothyroidism, followup visit    History of Present Illness:   The hypothyroidism was first diagnosed after radioactive iodine treatment for [name] several years ago  Previously he had lost significant amount of weight with his worsening heart failure and subsequently this stabilized More recently his weight has improved somewhat and is stable.  Previously was taking 200 mcg daily since 3/15 when he was hospitalized  Subsequently he was on steady dose of 175 mcg since 11/2013  More recently his TSH levels was low in mid 2016 He was supposed to be taking 150 g daily but he is not sure what he is doing He says he takes an extra half tablet once a week but not clear what that this is the 150 g dose on the 100 g dose Also did not come back for follow-up as directed last year  He is not complaining of newfatigue or weakness and overall feels fairly good Able to walk No cold intolerance His weight is about the same as last year  Compliance with the medical regimen has been as prescribed with taking the tablet in the morning before breakfast.  Has been taking his iron tablet in the evening as previously instructed  His labs are pending  Wt Readings from Last 3 Encounters:  11/06/15 157 lb 9.6 oz (71.487 kg)  09/02/15 157 lb (71.215 kg)  07/10/15 162 lb 6.4 oz (73.664 kg)    Lab Results  Component Value Date   TSH 0.15* 02/26/2015   TSH 0.118* 01/02/2015   TSH 3.919 11/01/2014   FREET4 1.72* 02/26/2015   FREET4 1.63 11/01/2014   FREET4 1.29 10/29/2014        Medication List       This list is accurate as of: 11/06/15 10:11 AM.  Always use your most recent med list.               aspirin EC 325 MG tablet  Take 325 mg by mouth daily.     citalopram 10 MG tablet  Commonly known as:  CELEXA  TAKE ONE TABLET BY MOUTH ONCE DAILY     citalopram 10 MG tablet  Commonly known as:  CELEXA  TAKE ONE TABLET BY MOUTH ONCE DAILY     CoQ10 200 MG Caps  Take 200 mg by mouth daily.     ezetimibe 10 MG tablet  Commonly known as:  ZETIA  TAKE ONE TABLET BY MOUTH ONCE DAILY     furosemide 20 MG tablet  Commonly known as:  LASIX  Take 20 mg by mouth daily as needed (Take 20 mg as daily as needed for wt > 153 lbs).     hydrALAZINE 25 MG tablet  Commonly known as:  APRESOLINE  Take 3 tablets (75 mg total) by mouth 3 (three) times daily.     oseltamivir 75 MG capsule  Commonly known as:  TAMIFLU     pantoprazole 40 MG tablet  Commonly known as:  PROTONIX  Take 1 tablet (40 mg total) by mouth daily.     potassium chloride SA 20 MEQ tablet  Commonly known as:  K-DUR,KLOR-CON  Take 1 tablet (20 mEq total) by mouth daily as needed. Take one potassium pill when you take your lasix.     PROBIOTIC PO  Take 1 capsule by mouth daily.     rosuvastatin  20 MG tablet  Commonly known as:  CRESTOR  Take 1 tablet (20 mg total) by mouth daily.     spironolactone 25 MG tablet  Commonly known as:  ALDACTONE  Take 1 tablet (25 mg total) by mouth daily.     SYNTHROID 100 MCG tablet  Generic drug:  levothyroxine  Take 100 mcg by mouth daily before breakfast. Take 1 tab daily except 1.5 on Wed     levothyroxine 150 MCG tablet  Commonly known as:  SYNTHROID, LEVOTHROID  TAKE ONE TABLET BY MOUTH ONCE DAILY     temazepam 15 MG capsule  Commonly known as:  RESTORIL  Take 1 capsule (15 mg total) by mouth at bedtime.     warfarin 5 MG tablet  Commonly known as:  COUMADIN  Take 1.5 tablets every day but Wednesday or as directed by clinic         Past Medical History  Diagnosis Date  . Ischemic cardiomyopathy     a. 10/09 Echo: Sev LV Dysfxn, inf/lat AK, Mod MR.  [name];  s/p Lat  MI 2009 with occlusion of the LCX - treated with Promus stenting. Has total occlusion of the RCA.   . ICD (implantable cardiac defibrillator) in place     PROPHYLACTIC      medtronic  . RBBB   . Inferior MI ([place]) "? date"; 2009  . Hypertension   . PVC (premature ventricular contraction)   . Pacemaker   . CHF (congestive heart failure) ([place])   . Shortness of breath   . Automatic implantable cardioverter-defibrillator in situ   . LVAD (left ventricular assist device) present [facility])     Past Surgical History  Procedure Laterality Date  . Defibrillator  2009  . Total knee arthroplasty  11/27/11    left  . Insert / replace / remove pacemaker  2009  . Coronary angioplasty with stent placement  2004  . Coronary angioplasty with stent placement  07/2008  . Knee arthroscopy w/ partial medial meniscectomy  12/2002    left  . Knee arthroscopy      bilaterally  . Total knee arthroplasty  11/27/2011    Procedure: TOTAL KNEE ARTHROPLASTY;  Surgeon: Yvette Rack., MD;  Location: [place];  Service: Orthopedics;  Laterality: Left;  left total knee arthroplasty  . Esophagogastroduodenoscopy N/A 09/15/2013    Procedure: ESOPHAGOGASTRODUODENOSCOPY (EGD);  Surgeon: Ladene Artist, MD;  Location: [facility] ENDOSCOPY;  Service: Endoscopy;  Laterality: N/A;  bedside  . Insertion of implantable left ventricular assist device N/A 09/18/2013    Procedure: INSERTION OF IMPLANTABLE LEFT VENTRICULAR ASSIST DEVICE;  Surgeon: Ivin Poot, MD;  Location: [place];  Service: Open Heart Surgery;  Laterality: N/A;  CIRC ARREST  NITRIC OXIDE  . Intraoperative transesophageal echocardiogram N/A 09/18/2013    Procedure: INTRAOPERATIVE TRANSESOPHAGEAL ECHOCARDIOGRAM;  Surgeon: Ivin Poot, MD;  Location: [place];  Service: Open Heart Surgery;  Laterality: N/A;  . Video assisted thoracoscopy Right 11/06/2013    Procedure: VIDEO ASSISTED THORACOSCOPY;  Surgeon: Gaye Pollack, MD;  Location: [place];  Service: Cardiothoracic;   Laterality: Right;  . Hematoma evacuation Right 11/06/2013    Procedure: EVACUATION HEMATOMA;  Surgeon: Gaye Pollack, MD;  Location: [place];  Service: Cardiothoracic;  Laterality: Right;  .  Right heart catheterization N/A 08/25/2013    Procedure: RIGHT HEART CATH;  Surgeon: Jolaine Artist, MD;  Location: [facility] CATH LAB;  Service: Cardiovascular;  Laterality: N/A;  . Right heart catheterization N/A 09/13/2013    Procedure: RIGHT HEART CATH;  Surgeon: Jolaine Artist, MD;  Location: [facility] CATH LAB;  Service: Cardiovascular;  Laterality: N/A;  . Intra-aortic balloon pump insertion N/A 09/14/2013    Procedure: INTRA-AORTIC BALLOON PUMP INSERTION;  Surgeon: Jolaine Artist, MD;  Location: [facility] CATH LAB;  Service: Cardiovascular;  Laterality: N/A;  . Right heart catheterization N/A 02/08/2014    Procedure: RIGHT HEART CATH;  Surgeon: Jolaine Artist, MD;  Location: [facility] CATH LAB;  Service: Cardiovascular;  Laterality: N/A;  . Right heart catheterization N/A 02/12/2014    Procedure: RIGHT HEART CATH;  Surgeon: Jolaine Artist, MD;  Location: [place] CATH LAB;  Service: Cardiovascular;  Laterality: N/A;  . Left ventricular assist device      Family History  Problem Relation Age of Onset  . Heart failure Father   . Stroke Father   . Coronary artery disease Mother   . Heart disease Mother   . Hyperlipidemia Sister     Social History:  reports that he has never smoked. He has never used smokeless tobacco. He reports that he does not drink alcohol or use illicit drugs.  Allergies:  Allergies  Allergen Reactions  . Ace Inhibitors     Hypotension and elevated creatinine  . Diovan [Valsartan] Other (See Comments)    Hypotension at low dose, hyperkalemia  . Lipitor [Atorvastatin Calcium] Other (See Comments)    Muscle pain  . Norvasc [Amlodipine] Other (See Comments)    Put patient to sleep   ROS:  Still using ventricle assist device and anticoagulation  Appettite is good, weight is  stable  Wt Readings from Last 3 Encounters:  11/06/15 157 lb 9.6 oz (71.487 kg)  09/02/15 157 lb (71.215 kg)  07/10/15 162 lb 6.4 oz (73.664 kg)     Examination:   Pulse 39  Temp(Src) 97.9 F (36.6 C)  Resp 14  Ht 5\' 7"  (1.702 m)  Wt 157 lb 9.6 oz (71.487 kg)  BMI 24.68 kg/m2  SpO2 99%  Unable to get vital signs because of his ventricular assist device He looks well Biceps reflexes appear normal, difficult to elicit    Assessment/Plan:   Post ablative Hypothyroidism:  He is subjectively doing well Unable to assess his pulse Also somewhat unclear what dose of Synthroid he is taking, possibly 7-1/2 tablets of 150 g weekly which may be too much Will check his labs and decide on the dose  Kha Hari 11/06/2015, 10:11 AM

## 2015-11-07 NOTE — Progress Notes (Signed)
Quick Note:  Please let patient know that the thyroid level is too high and need to know exactly what dose he is taking ______

## 2015-11-11 ENCOUNTER — Other Ambulatory Visit: Payer: Self-pay | Admitting: *Deleted

## 2015-11-11 MED ORDER — LEVOTHYROXINE SODIUM 150 MCG PO TABS: ORAL_TABLET | ORAL | Status: DC

## 2015-11-12 ENCOUNTER — Ambulatory Visit (HOSPITAL_COMMUNITY)
Admission: RE | Admit: 2015-11-12 | Discharge: 2015-11-12 | Disposition: A | Discharge status: Home / Self Care | Payer: Medicare Other | Source: Ambulatory Visit | Attending: Internal Medicine | Admitting: Internal Medicine

## 2015-11-12 ENCOUNTER — Ambulatory Visit (HOSPITAL_COMMUNITY): Payer: Self-pay | Admitting: *Deleted

## 2015-11-12 ENCOUNTER — Telehealth: Payer: Self-pay | Admitting: Endocrinology

## 2015-11-12 ENCOUNTER — Other Ambulatory Visit (HOSPITAL_COMMUNITY): Payer: Self-pay | Admitting: *Deleted

## 2015-11-12 VITALS — BP 76/0 | HR 80 | Ht 69.0 in | Wt 161.6 lb

## 2015-11-12 DIAGNOSIS — I5022 Chronic systolic (congestive) heart failure: Secondary | ICD-10-CM | POA: Diagnosis not present

## 2015-11-12 DIAGNOSIS — Z7901 Long term (current) use of anticoagulants: Secondary | ICD-10-CM | POA: Insufficient documentation

## 2015-11-12 DIAGNOSIS — Z95811 Presence of heart assist device: Secondary | ICD-10-CM | POA: Diagnosis not present

## 2015-11-12 DIAGNOSIS — Z5181 Encounter for therapeutic drug level monitoring: Secondary | ICD-10-CM | POA: Diagnosis not present

## 2015-11-12 DIAGNOSIS — Z79899 Other long term (current) drug therapy: Secondary | ICD-10-CM | POA: Insufficient documentation

## 2015-11-12 DIAGNOSIS — E039 Hypothyroidism, unspecified: Secondary | ICD-10-CM

## 2015-11-12 DIAGNOSIS — I10 Essential (primary) hypertension: Secondary | ICD-10-CM | POA: Diagnosis not present

## 2015-11-12 LAB — CBC
HCT: 46.2 % (ref 39.0–52.0)
Hemoglobin: 15.1 g/dL (ref 13.0–17.0)
MCH: 30.1 pg (ref 26.0–34.0)
MCHC: 32.7 g/dL (ref 30.0–36.0)
MCV: 92.2 fL (ref 78.0–100.0)
Platelets: 140 10*3/uL — ABNORMAL LOW (ref 150–400)
RBC: 5.01 MIL/uL (ref 4.22–5.81)
RDW: 15 % (ref 11.5–15.5)
WBC: 5.8 10*3/uL (ref 4.0–10.5)

## 2015-11-12 LAB — PROTIME-INR
INR: 2.43 — ABNORMAL HIGH (ref 0.00–1.49)
Prothrombin Time: 26.1 seconds — ABNORMAL HIGH (ref 11.6–15.2)

## 2015-11-12 LAB — BASIC METABOLIC PANEL
Anion gap: 9 (ref 5–15)
BUN: 20 mg/dL (ref 6–20)
CO2: 27 mmol/L (ref 22–32)
Calcium: 9.7 mg/dL (ref 8.9–10.3)
Chloride: 104 mmol/L (ref 101–111)
Creatinine, Ser: 1.39 mg/dL — ABNORMAL HIGH (ref 0.61–1.24)
GFR calc Af Amer: 57 mL/min — ABNORMAL LOW (ref >60)
GFR calc non Af Amer: 49 mL/min — ABNORMAL LOW (ref >60)
Glucose, Bld: 95 mg/dL (ref 65–99)
Potassium: 4.4 mmol/L (ref 3.5–5.1)
Sodium: 140 mmol/L (ref 135–145)

## 2015-11-12 LAB — LACTATE DEHYDROGENASE: LDH: 266 U/L — ABNORMAL HIGH (ref 98–192)

## 2015-11-12 NOTE — Telephone Encounter (Signed)
I spoke with Johnny Oneill and let him know he is only suppose to take 1/2 a tablet on wednesdays and 1 tablet daily the other 6 days.

## 2015-11-12 NOTE — Telephone Encounter (Signed)
Pt has questions about dosage for his med 150 mcg of the Levothyroxine he feels he was told to take one pill daily and one and a half pill on Wednesday please advise

## 2015-11-12 NOTE — Progress Notes (Signed)
Advanced Heart Failure Medication Review by a Pharmacist  Does the patient  feel that his/her medications are working for him/her?  yes  Has the patient been experiencing any side effects to the medications prescribed?  no  Does the patient measure his/her own blood pressure or blood glucose at home?  yes   Does the patient have any problems obtaining medications due to transportation or finances?   no  Understanding of regimen: good Understanding of indications: good Potential of compliance: good Patient understands to avoid NSAIDs. Patient understands to avoid decongestants.  Issues to address at subsequent visits: None   Pharmacist comments: 75 YO pleasant male presenting to [place] clinic for follow-up. Pt denies s/sx of bleeding on warfarin or any other SE on his current regimen.  Pt states that he has not taken lasix (or KCL) in a couple of months as he has not had fluid overload. Pt reports he is taking Synthroid 150 mg daily except 225 mg on Wednesday.  Pt states his PCP has his dose wrong and he is going to his PCP office today to figure out the dosage.    Time with patient: 10 min  Preparation and documentation time: 5 min  Total time: 15 min

## 2015-11-12 NOTE — Patient Instructions (Signed)
Follow up in 2 months

## 2015-11-12 NOTE — Progress Notes (Signed)
Pt presented to clinic by himself. He reports his caregiver [name]) has had the flu for past 9 days. He denies any symptoms of flu.   Vital signs: HR:   80 Doppler MAP: 76 Auto cuff BP:  135/66 (85) O2 Sat: 98 Wt:  161.6 lbs Last wt:  154.2 lbs Ht: 5'9"  LVAD interrogation reveals:  Speed:  9200 Flow:  4.6 Power:  5.2 PI: 7.2 Alarms:  Events:   Fixed speed:  9200 Low speed limit:  8600 Primary Controller:  Replace back up battery in  7 months Back up controller:   Replace back up battery in 6 months  Med rec performed by PharmD.  VAD coordinator paged to endo.  Pt seen by Dr. Haroldine Laws.

## 2015-11-18 NOTE — Progress Notes (Signed)
Patient ID: Johnny Oneill, male   DOB: [birth year], 75 y.o.   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   Admitted 3/4-3/25/15 with SOB and syncopal episode. Found to have SBO and NG placed and started on TPN. On 11/06/13 developed progressive SOB and hypotension and co-ox 51% and Hgb 6.7. Started on milrinone and transfused. Echo showed nl RV function. Patient had PEA arrest and was intubated and started on pressors. CXR revelaed a R hemothorax and a R CT placed. He required VATs and evacuation of hemothorax and was then tansferred to CIR.    Follow up for Heart Failure: Overall doing very well. Wife currently ill with flu for past 9-10 days. He does not have any flu-like symptoms. Taking all meds without problem. Still with insomnia and Restoril refilled last week.  Denies SOB/PND/Orthopnea/edema.  No bleeding problems.  Seen by Dr. Rayann Heman and device at [facility]. No plans to replace.    Echo 9/16 EF 20%, LVAD cannula ok moderate RV dilation with mild to moderately decreased RV systolic function.   Labs 03/05/2015 INR 2.1, Hgb 14.2, WBC 6, LDH 199, K 3.5, creatinine 1.38, BNP 230   Past Medical History  Diagnosis Date  . Ischemic cardiomyopathy     a. 10/09 Echo: Sev LV Dysfxn, inf/lat AK, Mod MR.  [name];  s/p Lat MI 2009 with occlusion of the LCX - treated with Promus stenting. Has total occlusion of the RCA.   . ICD (implantable cardiac defibrillator) in place     PROPHYLACTIC      medtronic  . RBBB   . Inferior MI ([place]) "? date"; 2009  . Hypertension   . PVC (premature ventricular contraction)   . Pacemaker   . CHF (congestive heart failure) ([place])   . Shortness of breath   . Automatic  implantable cardioverter-defibrillator in situ   . LVAD (left ventricular assist device) present [facility])     Current Outpatient Prescriptions  Medication Sig Dispense Refill  . aspirin EC 325 MG tablet Take 325 mg by mouth daily.    . citalopram (CELEXA) 10 MG tablet TAKE ONE TABLET BY MOUTH ONCE DAILY 90 tablet 0  . Coenzyme Q10 (COQ10) 200 MG CAPS Take 200 mg by mouth daily.    [name] ezetimibe (ZETIA) 10 MG tablet TAKE ONE TABLET BY MOUTH ONCE DAILY 30 tablet 0  . furosemide (LASIX) 20 MG tablet Take 20 mg by mouth daily as needed (Take 20 mg as daily as needed for wt > 153 lbs).    . hydrALAZINE (APRESOLINE) 25 MG tablet Take 50 mg by mouth 3 (three) times daily.    [name] levothyroxine (SYNTHROID, LEVOTHROID) 150 MCG tablet Take 1 tablet 6 days a week and only 1/2 tablet on Wednesday.  Follow up in 2 months. (Patient taking differently: Take 1 tablet 6 days a week and only 1 and 1/2 tablet on Wednesday.  Follow up in 2 months.) 90 tablet 0  . pantoprazole (PROTONIX) 40 MG tablet Take 1 tablet (40 mg total) by mouth daily. 30 tablet 6  . potassium chloride SA (K-DUR,KLOR-CON) 20 MEQ tablet Take  1 tablet (20 mEq total) by mouth daily as needed. Take one potassium pill when you take your lasix. 90 tablet 3  . Probiotic Product (PROBIOTIC PO) Take 1 capsule by mouth daily.    . rosuvastatin (CRESTOR) 20 MG tablet Take 1 tablet (20 mg total) by mouth daily. 90 tablet 3  . spironolactone (ALDACTONE) 25 MG tablet Take 1 tablet (25 mg total) by mouth daily. 90 tablet 3  . temazepam (RESTORIL) 15 MG capsule Take 1 capsule (15 mg total) by mouth at bedtime. 30 capsule 0  . warfarin (COUMADIN) 5 MG tablet Take 7.5 mg by mouth daily.     No current facility-administered medications for this encounter.   Facility-Administered Medications Ordered in Other Encounters  Medication Dose Route Frequency Provider Last Rate Last Dose  . etomidate (AMIDATE) injection    Anesthesia Intra-op Myna Bright, CRNA   20  mg at 02/08/14 0915  . rocuronium [facility]) injection    Anesthesia Intra-op Myna Bright, CRNA   50 mg at 02/08/14 0932  . succinylcholine (ANECTINE) injection    Anesthesia Intra-op Myna Bright, CRNA   100 mg at 02/08/14 0915    Ace inhibitors; Diovan; Lipitor; and Norvasc  REVIEW OF SYSTEMS: All systems negative except as listed in HPI, PMH and Problem list.   LVAD interrogation reveals:  Speed: 9200 Flow: 4.6 Power: 5.2 PI: 7.2 Alarms:  Events:  Fixed speed: 9200 Low speed limit: 8600 Primary Controller: Replace back up battery in 7 months Back up controller: Replace back up battery in 6 months I reviewed the LVAD parameters from today, and compared the results to the patient's prior recorded data.  No programming changes were made.  The LVAD is functioning within specified parameters.  The patient performs LVAD self-test daily.  LVAD interrogation was negative for any significant power changes, alarms or PI events/speed drops.  LVAD equipment check completed and is in good working order.  Back-up equipment present.   LVAD education done on emergency procedures and precautions and reviewed exit site care.    Filed Vitals:   11/12/15 1350 11/12/15 1351  BP: 135/66 76/0  Pulse: 80   Height: 5\' 9"  (1.753 m)   Weight: 161 lb 9.6 oz (73.301 kg)   SpO2: 98%     Vital signs: HR: 80 Doppler MAP: 76 Auto cuff BP: 135/66 (85) O2 Sat: 98 Wt: 161.6 lbs Last wt: 154.2 lbs Ht: 5'9"   Physical Exam: GENERAL: Well appearing, male who presents to clinic today in no acute distress; Wife present HEENT: normal  NECK: Supple, JVP 6  no bruits. Prominent CV waves  No lymphadenopathy or thyromegaly appreciated.    CARDIAC:  Mechanical heart sounds with LVAD hum present.  LUNGS:  Clear to auscultation bilaterally.  ABDOMEN:  Soft, round, nontender, positive bowel sounds x4. No distension. nontender LVAD exit site: well-healed and semi incorporated.  Dressing  dry and intact. Stabilization device present and accurately applied.  Driveline dressing is being changed weekly per sterile technique. EXTREMITIES:  Warm and dry, no cyanosis, clubbing, rash.  No edema.  NEUROLOGIC:  Alert and oriented x 4.  Gait steady.  No aphasia.  No dysarthria.  Affect pleasant.      ASSESSMENT AND PLAN:   1) Chronic systolic HF: ICM, EF 0000000 s/p LVAD HM II implant 08/2013 for DT. NYHA II symptoms. Volume status stable. Continue Lasix prn - Doing well. - MAP ok.  Continue current regimen.  - He has not tolerated ACE-I  or amlodipine in the past. - ICD at [facility]. No plans to replace. He atrial paces 40%. We will need to follow closely to make sure that he does not decompensate with loss of atrial pacing. He is not on any negative chronotropic agents currently.  2) LVAD: HMII 08/2013 for DT.  Doing well. LVAD parameters stable. 3) Chronic anticoagulation: -  INR goal 2.0-3.0. INR today 2.4  No bleeding issues.   - Continue ASA 325 mg for LVAD. 4) HTN: MAP 70-80s. Continue current meds.  5) Hypothyroidism: Per Dr Dwyane Dee. Continue synthroid 150 mcg.    Glori Bickers  MD  11/18/2015

## 2015-11-24 ENCOUNTER — Encounter (HOSPITAL_COMMUNITY): Payer: Self-pay | Admitting: *Deleted

## 2015-11-24 ENCOUNTER — Telehealth (HOSPITAL_COMMUNITY): Payer: Self-pay | Admitting: *Deleted

## 2015-11-24 DIAGNOSIS — R112 Nausea with vomiting, unspecified: Secondary | ICD-10-CM

## 2015-11-24 MED ORDER — ONDANSETRON 8 MG PO TBDP: 8.0000 mg | ORAL_TABLET | Freq: Three times a day (TID) | ORAL | Status: DC | PRN

## 2015-11-24 NOTE — Telephone Encounter (Signed)
Wife called VAD pager to report VAD controller "yellow wrench" advisory: "clock not set".  Says alarm just started a few minutes ago. Confirmed green "circle of lights" on, thus indicating pump is running.  Unable to review VAD parameters on screen, just see "clock not set"; "contact hospital" advisory.  Wife also reports pt woke with GI "bug" and has been vomiting since 9 am this am. She gave 1/4 tab of phenergan, pt vomited back up. She also reports pt "passed out in bathroom" earlier, but he "doesn't remember". Denies any injuries.   Advised her per Dr. Aundra Dubin to hold meds today, come to clinic for equipment check along with BP and VAD parameter check in case pt needs IVF. Will send Rx for SL Zofran.  Will meet pt and wife at [facility] VAD clinic at 2 pm. Wife verbalized understanding of above.

## 2015-11-26 ENCOUNTER — Encounter (HOSPITAL_COMMUNITY): Payer: Self-pay | Admitting: *Deleted

## 2015-11-26 ENCOUNTER — Ambulatory Visit (HOSPITAL_COMMUNITY): Payer: Self-pay | Admitting: *Deleted

## 2015-11-26 ENCOUNTER — Other Ambulatory Visit (HOSPITAL_COMMUNITY): Payer: Self-pay | Admitting: *Deleted

## 2015-11-26 ENCOUNTER — Ambulatory Visit (HOSPITAL_COMMUNITY)
Admission: RE | Admit: 2015-11-26 | Discharge: 2015-11-26 | Disposition: A | Discharge status: Home / Self Care | Payer: Medicare Other | Source: Ambulatory Visit | Attending: Internal Medicine | Admitting: Internal Medicine

## 2015-11-26 DIAGNOSIS — Z95811 Presence of heart assist device: Secondary | ICD-10-CM | POA: Diagnosis not present

## 2015-11-26 DIAGNOSIS — Z7901 Long term (current) use of anticoagulants: Secondary | ICD-10-CM

## 2015-11-26 DIAGNOSIS — Z4509 Encounter for adjustment and management of other cardiac device: Secondary | ICD-10-CM | POA: Diagnosis not present

## 2015-11-26 DIAGNOSIS — I5023 Acute on chronic systolic (congestive) heart failure: Secondary | ICD-10-CM | POA: Diagnosis not present

## 2015-11-26 LAB — PROTIME-INR
INR: 3.13 — ABNORMAL HIGH (ref 0.00–1.49)
Prothrombin Time: 31.6 seconds — ABNORMAL HIGH (ref 11.6–15.2)

## 2015-11-26 NOTE — Progress Notes (Signed)
  Vital Signs: Doppler BP: 86 Automatic BP: 97/51 (74) HR: 70 SPO2: 94 %  VAD interrogation & Equipment Management: Speed: 9200 Flow: 5.3 Power: 5.2 PI:6.0 Alarms: none since last visit Events: 0 - 5 PI events daily  Fixed speed: 9200 Low speed limit: 8600  Event log from Sunday episode of "controller clock not set" along with picture of controller discoloration sent to [facility] for review. Per Glenetta Hew, [employer] clinical technician:   The log file for patient [facility] confirms the clock not set alarm. This may have been caused by a poor EBB ribbon cable connection  or EBB failure. The pump maintained the set speed at this time and throughout the entire log file. The pump parameters appear stable. No other abnormal events noted in the event history.   Fixed Speed Low Speed Limit Average Speed   9200 8600 [id number]         Power(w) Flow(lpm) Average PI  High 6.9 7.7 8.1  Low 4.7 3.6 4.9  Operating Range 2.2 4.1 3.2  Average 5.5 5.1 6.6   As to the controller discoloration:  I am suspecting some moisture may have gotten in the battery compartment and recommend returning for a closer look.  Controller [id number] replaced with new primary controller:  [id number].   Back up controller 11V battery replaced per protocol (has 6 months battery life left) with SQ [id number].  At end of visit:  Primary Controller: Replace back up battery in 30 months. Back up controller: Replace back up battery in 30 months.

## 2015-11-26 NOTE — Progress Notes (Signed)
Met patient and wife in VAD clinic. Pt having "dry heaves" upon arrival. V/S and VAD parameters within normal limits.   History confirms "contorller clock not set".  Re-set clock and 11V back up battery has 7 months until end of life. Event log downloaded and will send to [facility].     [place] and spoke with Magazine features editor) and updated re: advisory. He suggested it may be loose connection to 11V battery and instructed me to disconnect and re-connect battery and monitor. Since battery near end of life, elected to replace with new 11V battery [id number].  Discoloration inside of controller cover noted; will send info to [facility] for review.     Vital Signs: Doppler BP:  90 Automatic BP:  106/78 (86) HR:  85 SPO2: 94 %   VAD interrogation & Equipment Management: Speed:  9200 Flow:  5.4 Power:  5.6 PI:  7.2 Alarms:  "controller clock not set" Events:  0 - 5 PI events daily  Fixed speed:  9200 Low speed limit:  8600  Primary Controller:  Replace back up battery in 7 months. Back up controller:   Replace back up battery in 6 months.   Per Dr. Aundra Dubin, instructed pt and wife to call VAD pager if symptoms do not improve or worsen, or if any further alarms or advisories. Both verbalized understanding of same.

## 2015-11-29 ENCOUNTER — Other Ambulatory Visit (HOSPITAL_COMMUNITY): Payer: Medicare Other

## 2015-12-03 ENCOUNTER — Other Ambulatory Visit (HOSPITAL_COMMUNITY): Payer: Self-pay | Admitting: Cardiology

## 2015-12-09 ENCOUNTER — Other Ambulatory Visit (HOSPITAL_COMMUNITY): Payer: Self-pay | Admitting: Unknown Physician Specialty

## 2015-12-09 ENCOUNTER — Ambulatory Visit (HOSPITAL_COMMUNITY)
Admission: RE | Admit: 2015-12-09 | Discharge: 2015-12-09 | Disposition: A | Discharge status: Home / Self Care | Payer: Medicare Other | Source: Ambulatory Visit | Attending: Internal Medicine | Admitting: Internal Medicine

## 2015-12-09 ENCOUNTER — Other Ambulatory Visit (HOSPITAL_COMMUNITY): Payer: Self-pay | Admitting: *Deleted

## 2015-12-09 DIAGNOSIS — Z95811 Presence of heart assist device: Secondary | ICD-10-CM | POA: Insufficient documentation

## 2015-12-09 DIAGNOSIS — I509 Heart failure, unspecified: Secondary | ICD-10-CM

## 2015-12-09 DIAGNOSIS — Z7901 Long term (current) use of anticoagulants: Secondary | ICD-10-CM | POA: Insufficient documentation

## 2015-12-09 DIAGNOSIS — Z5181 Encounter for therapeutic drug level monitoring: Secondary | ICD-10-CM | POA: Insufficient documentation

## 2015-12-09 DIAGNOSIS — I255 Ischemic cardiomyopathy: Secondary | ICD-10-CM

## 2015-12-09 LAB — PROTIME-INR
INR: 2.57 — ABNORMAL HIGH (ref 0.00–1.49)
Prothrombin Time: 27.2 seconds — ABNORMAL HIGH (ref 11.6–15.2)

## 2015-12-09 MED ORDER — EZETIMIBE 10 MG PO TABS: 10.0000 mg | ORAL_TABLET | Freq: Every day | ORAL | Status: DC

## 2015-12-23 ENCOUNTER — Ambulatory Visit (HOSPITAL_COMMUNITY)
Admission: RE | Admit: 2015-12-23 | Discharge: 2015-12-23 | Disposition: A | Discharge status: Home / Self Care | Payer: Medicare Other | Source: Ambulatory Visit | Attending: Cardiology | Admitting: Cardiology

## 2015-12-23 ENCOUNTER — Ambulatory Visit (HOSPITAL_COMMUNITY): Payer: Self-pay | Admitting: Unknown Physician Specialty

## 2015-12-23 DIAGNOSIS — I5023 Acute on chronic systolic (congestive) heart failure: Secondary | ICD-10-CM | POA: Diagnosis not present

## 2015-12-23 DIAGNOSIS — Z95811 Presence of heart assist device: Secondary | ICD-10-CM | POA: Insufficient documentation

## 2015-12-23 DIAGNOSIS — Z5181 Encounter for therapeutic drug level monitoring: Secondary | ICD-10-CM | POA: Insufficient documentation

## 2015-12-23 DIAGNOSIS — Z7901 Long term (current) use of anticoagulants: Secondary | ICD-10-CM | POA: Insufficient documentation

## 2015-12-23 LAB — PROTIME-INR
INR: 2.73 — ABNORMAL HIGH (ref 0.00–1.49)
Prothrombin Time: 28.5 seconds — ABNORMAL HIGH (ref 11.6–15.2)

## 2015-12-23 NOTE — Progress Notes (Signed)
Pt asked VAD coordinator to check drive line exit site. Pt stopped using gauze dressing @ 2 weeks ago and switched back to weekly sorbaview dressings (without Biopatch). Increased redness around exit site with some crusting noted. Darrick Grinder, NP assessed and instructed pt to return to daily dressing changes using gauze dressings.   If no improvement or symptoms worsen, pt is to call VAD clinic for re-assessment. Pt verbalized understanding of same. Reports he has adequate dressing supplies at home.

## 2015-12-30 ENCOUNTER — Other Ambulatory Visit (HOSPITAL_COMMUNITY): Payer: Self-pay | Admitting: *Deleted

## 2015-12-30 DIAGNOSIS — I509 Heart failure, unspecified: Secondary | ICD-10-CM

## 2015-12-30 DIAGNOSIS — I255 Ischemic cardiomyopathy: Secondary | ICD-10-CM

## 2015-12-30 DIAGNOSIS — F32A Depression, unspecified: Secondary | ICD-10-CM

## 2015-12-30 DIAGNOSIS — F329 Major depressive disorder, single episode, unspecified: Secondary | ICD-10-CM

## 2015-12-30 MED ORDER — CITALOPRAM HYDROBROMIDE 10 MG PO TABS: 10.0000 mg | ORAL_TABLET | Freq: Every day | ORAL | Status: DC

## 2016-01-08 ENCOUNTER — Other Ambulatory Visit: Payer: Self-pay | Admitting: Endocrinology

## 2016-01-09 DIAGNOSIS — M1711 Unilateral primary osteoarthritis, right knee: Secondary | ICD-10-CM | POA: Diagnosis not present

## 2016-01-09 DIAGNOSIS — M25531 Pain in right wrist: Secondary | ICD-10-CM | POA: Diagnosis not present

## 2016-01-14 ENCOUNTER — Ambulatory Visit (HOSPITAL_COMMUNITY)
Admission: RE | Admit: 2016-01-14 | Discharge: 2016-01-14 | Disposition: A | Discharge status: Home / Self Care | Payer: Medicare Other | Source: Ambulatory Visit | Attending: Cardiology | Admitting: Cardiology

## 2016-01-14 ENCOUNTER — Other Ambulatory Visit (HOSPITAL_COMMUNITY): Payer: Self-pay | Admitting: *Deleted

## 2016-01-14 ENCOUNTER — Ambulatory Visit (HOSPITAL_COMMUNITY): Payer: Self-pay | Admitting: *Deleted

## 2016-01-14 VITALS — BP 98/62 | HR 46 | Ht 66.0 in | Wt 157.8 lb

## 2016-01-14 DIAGNOSIS — G5600 Carpal tunnel syndrome, unspecified upper limb: Secondary | ICD-10-CM | POA: Insufficient documentation

## 2016-01-14 DIAGNOSIS — I110 Hypertensive heart disease with heart failure: Secondary | ICD-10-CM | POA: Diagnosis not present

## 2016-01-14 DIAGNOSIS — E7800 Pure hypercholesterolemia, unspecified: Secondary | ICD-10-CM | POA: Diagnosis not present

## 2016-01-14 DIAGNOSIS — I252 Old myocardial infarction: Secondary | ICD-10-CM | POA: Insufficient documentation

## 2016-01-14 DIAGNOSIS — Z7901 Long term (current) use of anticoagulants: Secondary | ICD-10-CM

## 2016-01-14 DIAGNOSIS — I255 Ischemic cardiomyopathy: Secondary | ICD-10-CM | POA: Diagnosis not present

## 2016-01-14 DIAGNOSIS — Z79899 Other long term (current) drug therapy: Secondary | ICD-10-CM | POA: Insufficient documentation

## 2016-01-14 DIAGNOSIS — Z95811 Presence of heart assist device: Secondary | ICD-10-CM

## 2016-01-14 DIAGNOSIS — Z955 Presence of coronary angioplasty implant and graft: Secondary | ICD-10-CM | POA: Insufficient documentation

## 2016-01-14 DIAGNOSIS — F419 Anxiety disorder, unspecified: Secondary | ICD-10-CM | POA: Insufficient documentation

## 2016-01-14 DIAGNOSIS — Z7982 Long term (current) use of aspirin: Secondary | ICD-10-CM | POA: Diagnosis not present

## 2016-01-14 DIAGNOSIS — Z96651 Presence of right artificial knee joint: Secondary | ICD-10-CM | POA: Diagnosis not present

## 2016-01-14 DIAGNOSIS — I5022 Chronic systolic (congestive) heart failure: Secondary | ICD-10-CM | POA: Insufficient documentation

## 2016-01-14 DIAGNOSIS — E039 Hypothyroidism, unspecified: Secondary | ICD-10-CM | POA: Insufficient documentation

## 2016-01-14 DIAGNOSIS — I2510 Atherosclerotic heart disease of native coronary artery without angina pectoris: Secondary | ICD-10-CM | POA: Diagnosis not present

## 2016-01-14 LAB — LACTATE DEHYDROGENASE: LDH: 244 U/L — ABNORMAL HIGH (ref 98–192)

## 2016-01-14 LAB — CBC
HCT: 46.8 % (ref 39.0–52.0)
Hemoglobin: 15.1 g/dL (ref 13.0–17.0)
MCH: 29.6 pg (ref 26.0–34.0)
MCHC: 32.3 g/dL (ref 30.0–36.0)
MCV: 91.8 fL (ref 78.0–100.0)
Platelets: 146 10*3/uL — ABNORMAL LOW (ref 150–400)
RBC: 5.1 MIL/uL (ref 4.22–5.81)
RDW: 14.8 % (ref 11.5–15.5)
WBC: 7.1 10*3/uL (ref 4.0–10.5)

## 2016-01-14 LAB — PROTIME-INR
INR: 2.37 — ABNORMAL HIGH (ref 0.00–1.49)
Prothrombin Time: 25.6 seconds — ABNORMAL HIGH (ref 11.6–15.2)

## 2016-01-14 LAB — BASIC METABOLIC PANEL
Anion gap: 6 (ref 5–15)
BUN: 27 mg/dL — ABNORMAL HIGH (ref 6–20)
CO2: 28 mmol/L (ref 22–32)
Calcium: 9.5 mg/dL (ref 8.9–10.3)
Chloride: 102 mmol/L (ref 101–111)
Creatinine, Ser: 1.64 mg/dL — ABNORMAL HIGH (ref 0.61–1.24)
GFR calc Af Amer: 47 mL/min — ABNORMAL LOW (ref >60)
GFR calc non Af Amer: 40 mL/min — ABNORMAL LOW (ref >60)
Glucose, Bld: 78 mg/dL (ref 65–99)
Potassium: 4.7 mmol/L (ref 3.5–5.1)
Sodium: 136 mmol/L (ref 135–145)

## 2016-01-14 NOTE — Progress Notes (Signed)
Patient ID: Johnny Oneill, male   DOB: Feb 24, 1941, 75 y.o.   MRN: [id number] Patient ID: Johnny Oneill, male   DOB: Aug 16, 1941, 75 y.o.   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   Admitted 3/4-3/25/15 with SOB and syncopal episode. Found to have SBO and NG placed and started on TPN. On 11/06/13 developed progressive SOB and hypotension and co-ox 51% and Hgb 6.7. Started on milrinone and transfused. Echo showed nl RV function. Patient had PEA arrest and was intubated and started on pressors. CXR revelaed a R hemothorax and a R CT placed. He required VATs and evacuation of hemothorax and was then tansferred to CIR.    Follow up for Heart Failure: Overall doing very well. Feels good. Very active. Denies dyspnea. No edema, orthopnea or PND. No problems with VAD. Taking lasix PRN. Has nerve conduction study coming up for carpal tunnel syndrome.  Taking all meds without problem. St  Denies SOB/PND/Orthopnea/edema.  No bleeding problems.  Seen by Johnny Oneill and device at [facility]. No plans to replace.    Echo 9/16 EF 20%, LVAD cannula ok moderate RV dilation with mild to moderately decreased RV systolic function.   Labs 03/05/2015 INR 2.1, Hgb 14.2, WBC 6, LDH 199, K 3.5, creatinine 1.38, BNP 230    VAD interrogation revealed:  Speed: 9200  Flow: 4.4  Power: 5.1  PI: 6.0  Alarms: none  Events: 0 - 5 PI events Fixed speed: 9200  Low speed limit: 8600  Primary Controller: Replace back up battery in 28 months.  Back up controller: Replace back up battery in 28 months.   I reviewed the LVAD parameters from today, and compared the results to the patient's prior recorded data.  No programming changes were made.  The LVAD is functioning within specified parameters.  The patient performs LVAD self-test daily.  LVAD interrogation was negative for any  significant power changes, alarms or PI events/speed drops.  LVAD equipment check completed and is in good working order.  Back-up equipment present.   LVAD education done on emergency procedures and precautions and reviewed exit site care.    Past Medical History  Diagnosis Date  . Ischemic cardiomyopathy     a. 10/09 Echo: Sev LV Dysfxn, inf/lat AK, Mod MR.  [name];  s/p Lat MI 2009 with occlusion of the LCX - treated with Promus stenting. Has total occlusion of the RCA.   . ICD (implantable cardiac defibrillator) in place     PROPHYLACTIC      medtronic  . RBBB   . Inferior MI ([place]) "? date"; 2009  . Hypertension   . PVC (premature ventricular contraction)   . Pacemaker   . CHF (congestive heart failure) ([place])   . Shortness of breath   . Automatic implantable cardioverter-defibrillator in situ   . LVAD (left ventricular assist device) present [facility])     Current Outpatient Prescriptions  Medication Sig Dispense Refill  . aspirin EC 325 MG tablet Take 325 mg by mouth daily.    . citalopram (CELEXA) 10 MG tablet Take 1 tablet (10 mg total) by mouth  daily. 90 tablet 3  . Coenzyme Q10 (COQ10) 200 MG CAPS Take 200 mg by mouth daily.    [name] ezetimibe (ZETIA) 10 MG tablet Take 1 tablet (10 mg total) by mouth daily. 90 tablet 3  . hydrALAZINE (APRESOLINE) 25 MG tablet Take 50 mg by mouth 3 (three) times daily.    [name] levothyroxine (SYNTHROID, LEVOTHROID) 150 MCG tablet Take 1 tablet 6 days a week and only 1/2 tablet on Wednesday.  Follow up in 2 months. (Patient taking differently: Take 1 tablet 6 days a week and only 1/2 tablet on Wednesday.) 90 tablet 0  . pantoprazole (PROTONIX) 40 MG tablet Take 1 tablet (40 mg total) by mouth daily. 30 tablet 6  . potassium chloride SA (K-DUR,KLOR-CON) 20 MEQ tablet Take 1 tablet (20 mEq total) by mouth daily  as needed. Take one potassium pill when you take your lasix. 90 tablet 3  . Probiotic Product (PROBIOTIC PO) Take 1 capsule by mouth daily.    . rosuvastatin (CRESTOR) 20 MG tablet Take 1 tablet (20 mg total) by mouth daily. 90 tablet 3  . spironolactone (ALDACTONE) 25 MG tablet Take 1 tablet (25 mg total) by mouth daily. 90 tablet 3  . temazepam (RESTORIL) 15 MG capsule Take 1 capsule (15 mg total) by mouth at bedtime. 30 capsule 0  . warfarin (COUMADIN) 5 MG tablet Take 7.5 mg by mouth daily.    . furosemide (LASIX) 20 MG tablet Take 20 mg by mouth daily as needed (Take 20 mg as daily as needed for wt > 153 lbs). Reported on 01/14/2016     No current facility-administered medications for this encounter.   Facility-Administered Medications Ordered in Other Encounters  Medication Dose Route Frequency Provider Last Rate Last Dose  . etomidate (AMIDATE) injection    Anesthesia Intra-op Myna Bright, CRNA   20 mg at 02/08/14 0915  . rocuronium [facility]) injection    Anesthesia Intra-op Myna Bright, CRNA   50 mg at 02/08/14 0932  . succinylcholine (ANECTINE) injection    Anesthesia Intra-op Myna Bright, CRNA   100 mg at 02/08/14 0915    Ace inhibitors; Diovan; Lipitor; and Norvasc  REVIEW OF SYSTEMS: All systems negative except as listed in HPI, PMH and Problem list.      Filed Vitals:   01/14/16 1024  BP: 98/62  Pulse: 46  Height: 5\' 6"  (1.676 m)  Weight: 157 lb 12.8 oz (71.578 kg)  SpO2: 97%    Vital signs: Doppler MAP: 84  Automatic BP: 98/62 (77)  Weight: 157.8 lbs  Last Weight: 161.6 lbs  HR: 76  SPO2: 97 %    Physical Exam: GENERAL: Well appearing, male who presents to clinic today in no acute distress; Wife present HEENT: normal  NECK: Supple, JVP 5-6  no bruits. Prominent CV waves  No lymphadenopathy or thyromegaly appreciated.    CARDIAC:  Mechanical heart sounds with LVAD hum present.  LUNGS:  Clear to auscultation bilaterally.  ABDOMEN:  Soft,  round, nontender, positive bowel sounds x4. No distension. nontender LVAD exit site: well-healed and semi incorporated.  Dressing dry and intact. Stabilization device present and accurately applied.  Driveline dressing is being changed weekly per sterile technique. EXTREMITIES:  Warm and dry, no cyanosis, clubbing, rash.  No edema.  NEUROLOGIC:  Alert and oriented x 4.  Gait steady.  No aphasia.  No dysarthria.  Affect pleasant.     ECG: NSR with frequent PVCs  ASSESSMENT AND PLAN:   1)  Chronic systolic HF: ICM, EF 0000000 s/p LVAD HM II implant 08/2013 for DT. - NYHA I-II symptoms. Volume status stable. Continue Lasix prn - Doing well. - MAP ok.  Continue current regimen.  - He has not tolerated ACE-I or amlodipine in the past. - ICD at [facility]. No plans to replace. He atrial paces 40%. We will need to follow closely to make sure that he does not decompensate with loss of atrial pacing. He is not on any negative chronotropic agents currently.  2) LVAD: HMII 08/2013 for DT.  Doing well. LVAD parameters stable. 3) Chronic anticoagulation: -  INR goal 2.0-3.0. INR today 2.4  No bleeding issues.   - Continue ASA 325 mg for LVAD. 4) HTN: MAP 70-80s. Continue current meds.  5) Hypothyroidism: Per Dr Dwyane Dee. Continue synthroid 150 mcg. May be switching to Dr. Renne Crigler soon 6) Carpal tunnel syndrome - pending NCS   Glori Bickers  MD  01/14/2016

## 2016-01-14 NOTE — Progress Notes (Addendum)
Johnny Oneill comes to clinic with his wife.   Symptom Yes No Details  Angina  x Activity:  Claudication  x How far:    Syncope  x When:  rarely with orthostatic changes  Stroke  x   Orthopnea  x How many pillows: 1  PND  x How often:  CPAP  n/a How many hrs:  Pedal edema  x   Abd fullness  x   N&V  x Good appetite; full meal   Diaphoresis  x When:  Bleeding x  Nosebleeds when not using humidifier  Urine  x Clear yellow   SOB  x Activity:  Palpitations  x When:   ICD shock  x   Hospitlizaitons  x When/where/why:  ED visit  x When/where/why:   Other MD  x When/who/why:     Activity  x No limitations   Fluid  x  about 2 L   Diet  x  No limitaions   Doppler MAP:  84 Automatic BP:   98/62 (77) Weight:  157.8 lbs Last Weight: 161.6  lbs HR:  76 SPO2: 97 %  Encounter Details: Reports to clinic with wife for 2 month follow up.  INR 2.37  --> Goal 2 - 3 with ASA 325 mg.. See anticoag note.   LDH 244   --> baseline 200 - 300   Creat1.64 ---> up from last visit 1.39; pt has not taken prn Lasix since last clinic visit.   EKG performed today - SR with frequent PVC's per Dr. Haroldine Laws.  Pt has upcoming appointment for nerve study of right hand; symptoms of numbness in two fingers suspicious of carpel tunnel syndrome.   VAD interrogation revealed: Speed:  9200 Flow: 4.4 Power: 5.1 PI: 6.0 Alarms: none Events: 0 - 5 PI events Fixed speed: 9200 Low speed limit: 8600 Primary Controller:  Replace back up battery in 28 months.  Back up controller:   Replace back up battery in 28 months.   I reviewed the LVAD parameters from today, and compared the results to the patient's prior recorded data.  No programming changes were made. The LVAD is functioning within specified parameters.  The patient performs LVAD self-test daily.  LVAD interrogation shows no change in PI events, power surges, or speed drops. LVAD equipment check completed and is in good working order. Back-up equipment  not present.   Drive Line: Reports no further itching or rash since switching to guaze dressings. Dressing changes are being performed 2 - 3 times weekly using gauze dressing and cleaansing with SALINE WIPES (CHG allergy). Anchor in place and accurately applied. Dressing supplies provided for home use.    Patient Instructions: 1.  No change in meds. 2.  Will call you with lab results and coumadin dosing.   Zada Girt, RN VAD Coordinator  Office: [phone] 24/7 VAD Pager: [phone]

## 2016-01-28 ENCOUNTER — Ambulatory Visit (HOSPITAL_COMMUNITY): Payer: Self-pay | Admitting: Infectious Diseases

## 2016-01-28 ENCOUNTER — Ambulatory Visit (HOSPITAL_COMMUNITY)
Admission: RE | Admit: 2016-01-28 | Discharge: 2016-01-28 | Disposition: A | Discharge status: Home / Self Care | Payer: Medicare Other | Source: Ambulatory Visit | Attending: Cardiology | Admitting: Cardiology

## 2016-01-28 ENCOUNTER — Other Ambulatory Visit (HOSPITAL_COMMUNITY): Payer: Self-pay | Admitting: Cardiology

## 2016-01-28 DIAGNOSIS — Z7901 Long term (current) use of anticoagulants: Secondary | ICD-10-CM | POA: Insufficient documentation

## 2016-01-28 DIAGNOSIS — Z5181 Encounter for therapeutic drug level monitoring: Secondary | ICD-10-CM | POA: Diagnosis not present

## 2016-01-28 DIAGNOSIS — Z95811 Presence of heart assist device: Secondary | ICD-10-CM

## 2016-01-28 DIAGNOSIS — I5023 Acute on chronic systolic (congestive) heart failure: Secondary | ICD-10-CM

## 2016-01-28 LAB — PROTIME-INR
INR: 2.04 — ABNORMAL HIGH (ref 0.00–1.49)
Prothrombin Time: 22.9 seconds — ABNORMAL HIGH (ref 11.6–15.2)

## 2016-01-31 ENCOUNTER — Other Ambulatory Visit (HOSPITAL_COMMUNITY): Payer: Self-pay | Admitting: *Deleted

## 2016-01-31 DIAGNOSIS — K219 Gastro-esophageal reflux disease without esophagitis: Secondary | ICD-10-CM

## 2016-01-31 DIAGNOSIS — E876 Hypokalemia: Secondary | ICD-10-CM

## 2016-01-31 MED ORDER — PANTOPRAZOLE SODIUM 40 MG PO TBEC: 40.0000 mg | DELAYED_RELEASE_TABLET | Freq: Every day | ORAL | Status: DC

## 2016-02-14 ENCOUNTER — Ambulatory Visit (HOSPITAL_COMMUNITY)
Admission: RE | Admit: 2016-02-14 | Discharge: 2016-02-14 | Disposition: A | Discharge status: Home / Self Care | Payer: Medicare Other | Source: Ambulatory Visit | Attending: Internal Medicine | Admitting: Internal Medicine

## 2016-02-14 ENCOUNTER — Ambulatory Visit (HOSPITAL_COMMUNITY): Payer: Self-pay | Admitting: Infectious Diseases

## 2016-02-14 DIAGNOSIS — Z5181 Encounter for therapeutic drug level monitoring: Secondary | ICD-10-CM

## 2016-02-14 DIAGNOSIS — I5022 Chronic systolic (congestive) heart failure: Secondary | ICD-10-CM

## 2016-02-14 DIAGNOSIS — Z7901 Long term (current) use of anticoagulants: Secondary | ICD-10-CM

## 2016-02-14 LAB — PROTIME-INR
INR: 2.46 — ABNORMAL HIGH (ref 0.00–1.49)
Prothrombin Time: 26.4 seconds — ABNORMAL HIGH (ref 11.6–15.2)

## 2016-02-20 ENCOUNTER — Ambulatory Visit (INDEPENDENT_AMBULATORY_CARE_PROVIDER_SITE_OTHER): Payer: Medicare Other | Admitting: Diagnostic Neuroimaging

## 2016-02-20 ENCOUNTER — Encounter (INDEPENDENT_AMBULATORY_CARE_PROVIDER_SITE_OTHER): Payer: Self-pay | Admitting: Diagnostic Neuroimaging

## 2016-02-20 DIAGNOSIS — R200 Anesthesia of skin: Secondary | ICD-10-CM

## 2016-02-20 DIAGNOSIS — R202 Paresthesia of skin: Secondary | ICD-10-CM | POA: Diagnosis not present

## 2016-02-20 DIAGNOSIS — Z0289 Encounter for other administrative examinations: Secondary | ICD-10-CM

## 2016-02-24 ENCOUNTER — Other Ambulatory Visit (HOSPITAL_COMMUNITY): Payer: Self-pay | Admitting: Internal Medicine

## 2016-02-24 DIAGNOSIS — Z95811 Presence of heart assist device: Secondary | ICD-10-CM

## 2016-02-24 DIAGNOSIS — Z7901 Long term (current) use of anticoagulants: Secondary | ICD-10-CM

## 2016-02-26 NOTE — Procedures (Signed)
   [facility]  NCS (NERVE CONDUCTION STUDY) WITH EMG (ELECTROMYOGRAPHY) REPORT   STUDY DATE: 02/20/16 PATIENT NAME: Johnny Oneill DOB: 1940/11/26 MRN: [id number]  ORDERING CLINICIAN: Louellen Molder, MD  TECHNOLOGIST: Laretta Alstrom  ELECTROMYOGRAPHER: Earlean Polka. Penumalli, MD  CLINICAL INFORMATION: 75 year old male with right hand numbness and tingling.  FINDINGS: NERVE CONDUCTION STUDY: Right median motor response is prolonged distal latency (5.0 ms) decreased amplitude, normal conduction velocity normal F-wave latency. Right ulnar motor response and F-wave latency are normal. Right median sensory response could not be obtained. Right ulnar sensory response is normal.   NEEDLE ELECTROMYOGRAPHY: Needle examination of right upper extremity deltoid, biceps, triceps, flexor carpi radialis, first dorsal interosseous is normal.   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    INTERPRETING PHYSICIAN:  Penni Bombard, MD Certified in Neurology, Neurophysiology and Neuroimaging  [facility] [address] [place], [place] [address] [phone]

## 2016-03-02 ENCOUNTER — Other Ambulatory Visit (HOSPITAL_COMMUNITY): Payer: Self-pay | Admitting: *Deleted

## 2016-03-02 ENCOUNTER — Other Ambulatory Visit (HOSPITAL_COMMUNITY): Payer: Medicare Other

## 2016-03-02 ENCOUNTER — Telehealth (HOSPITAL_COMMUNITY): Payer: Self-pay | Admitting: Infectious Diseases

## 2016-03-02 DIAGNOSIS — Z7901 Long term (current) use of anticoagulants: Secondary | ICD-10-CM

## 2016-03-02 DIAGNOSIS — Z95811 Presence of heart assist device: Secondary | ICD-10-CM

## 2016-03-02 NOTE — Telephone Encounter (Signed)
Called re: missed INR appointment. LVM to come today before 4 or CB to reschedule for later this week.    Janene Madeira, RN VAD Coordinator   Office: [phone] 24/7 VAD Pager: [phone]

## 2016-03-05 ENCOUNTER — Ambulatory Visit (HOSPITAL_COMMUNITY)
Admission: RE | Admit: 2016-03-05 | Discharge: 2016-03-05 | Disposition: A | Discharge status: Home / Self Care | Payer: Medicare Other | Source: Ambulatory Visit | Attending: Internal Medicine | Admitting: Internal Medicine

## 2016-03-05 ENCOUNTER — Ambulatory Visit (HOSPITAL_COMMUNITY): Payer: Self-pay | Admitting: Infectious Diseases

## 2016-03-05 DIAGNOSIS — Z95811 Presence of heart assist device: Secondary | ICD-10-CM | POA: Diagnosis not present

## 2016-03-05 DIAGNOSIS — Z7901 Long term (current) use of anticoagulants: Secondary | ICD-10-CM

## 2016-03-05 DIAGNOSIS — I5023 Acute on chronic systolic (congestive) heart failure: Secondary | ICD-10-CM

## 2016-03-05 LAB — PROTIME-INR
INR: 2.68 — ABNORMAL HIGH (ref 0.00–1.49)
Prothrombin Time: 28.1 seconds — ABNORMAL HIGH (ref 11.6–15.2)

## 2016-03-06 ENCOUNTER — Encounter (HOSPITAL_COMMUNITY): Payer: Self-pay | Admitting: Infectious Diseases

## 2016-03-17 ENCOUNTER — Other Ambulatory Visit (HOSPITAL_COMMUNITY): Payer: Self-pay | Admitting: Infectious Diseases

## 2016-03-17 DIAGNOSIS — I5041 Acute combined systolic (congestive) and diastolic (congestive) heart failure: Secondary | ICD-10-CM

## 2016-03-17 DIAGNOSIS — E039 Hypothyroidism, unspecified: Secondary | ICD-10-CM

## 2016-03-17 DIAGNOSIS — Z7901 Long term (current) use of anticoagulants: Secondary | ICD-10-CM

## 2016-03-17 DIAGNOSIS — I5022 Chronic systolic (congestive) heart failure: Secondary | ICD-10-CM

## 2016-03-17 DIAGNOSIS — Z95811 Presence of heart assist device: Secondary | ICD-10-CM

## 2016-03-17 DIAGNOSIS — G5601 Carpal tunnel syndrome, right upper limb: Secondary | ICD-10-CM | POA: Diagnosis not present

## 2016-03-18 ENCOUNTER — Ambulatory Visit (HOSPITAL_COMMUNITY)
Admission: RE | Admit: 2016-03-18 | Discharge: 2016-03-18 | Disposition: A | Discharge status: Home / Self Care | Payer: Medicare Other | Source: Ambulatory Visit | Attending: Cardiology | Admitting: Cardiology

## 2016-03-18 ENCOUNTER — Ambulatory Visit (HOSPITAL_COMMUNITY): Payer: Self-pay | Admitting: Infectious Diseases

## 2016-03-18 DIAGNOSIS — E039 Hypothyroidism, unspecified: Secondary | ICD-10-CM | POA: Diagnosis not present

## 2016-03-18 DIAGNOSIS — F419 Anxiety disorder, unspecified: Secondary | ICD-10-CM | POA: Diagnosis not present

## 2016-03-18 DIAGNOSIS — I110 Hypertensive heart disease with heart failure: Secondary | ICD-10-CM | POA: Insufficient documentation

## 2016-03-18 DIAGNOSIS — I255 Ischemic cardiomyopathy: Secondary | ICD-10-CM | POA: Insufficient documentation

## 2016-03-18 DIAGNOSIS — Z7901 Long term (current) use of anticoagulants: Secondary | ICD-10-CM | POA: Diagnosis not present

## 2016-03-18 DIAGNOSIS — G5600 Carpal tunnel syndrome, unspecified upper limb: Secondary | ICD-10-CM | POA: Insufficient documentation

## 2016-03-18 DIAGNOSIS — Z95811 Presence of heart assist device: Secondary | ICD-10-CM | POA: Insufficient documentation

## 2016-03-18 DIAGNOSIS — Z79899 Other long term (current) drug therapy: Secondary | ICD-10-CM | POA: Insufficient documentation

## 2016-03-18 DIAGNOSIS — I2510 Atherosclerotic heart disease of native coronary artery without angina pectoris: Secondary | ICD-10-CM | POA: Diagnosis not present

## 2016-03-18 DIAGNOSIS — I5022 Chronic systolic (congestive) heart failure: Secondary | ICD-10-CM | POA: Diagnosis not present

## 2016-03-18 DIAGNOSIS — E7800 Pure hypercholesterolemia, unspecified: Secondary | ICD-10-CM | POA: Diagnosis not present

## 2016-03-18 DIAGNOSIS — I5041 Acute combined systolic (congestive) and diastolic (congestive) heart failure: Secondary | ICD-10-CM

## 2016-03-18 DIAGNOSIS — M1990 Unspecified osteoarthritis, unspecified site: Secondary | ICD-10-CM | POA: Diagnosis not present

## 2016-03-18 LAB — COMPREHENSIVE METABOLIC PANEL
ALT: 21 U/L (ref 17–63)
AST: 27 U/L (ref 15–41)
Albumin: 4.3 g/dL (ref 3.5–5.0)
Alkaline Phosphatase: 74 U/L (ref 38–126)
Anion gap: 6 (ref 5–15)
BUN: 20 mg/dL (ref 6–20)
CO2: 27 mmol/L (ref 22–32)
Calcium: 9.5 mg/dL (ref 8.9–10.3)
Chloride: 104 mmol/L (ref 101–111)
Creatinine, Ser: 1.38 mg/dL — ABNORMAL HIGH (ref 0.61–1.24)
GFR calc Af Amer: 57 mL/min — ABNORMAL LOW (ref >60)
GFR calc non Af Amer: 50 mL/min — ABNORMAL LOW (ref >60)
Glucose, Bld: 103 mg/dL — ABNORMAL HIGH (ref 65–99)
Potassium: 4.2 mmol/L (ref 3.5–5.1)
Sodium: 137 mmol/L (ref 135–145)
Total Bilirubin: 1.1 mg/dL (ref 0.3–1.2)
Total Protein: 7.1 g/dL (ref 6.5–8.1)

## 2016-03-18 LAB — T4, FREE: Free T4: 1.14 ng/dL — ABNORMAL HIGH (ref 0.61–1.12)

## 2016-03-18 LAB — CBC
HCT: 43 % (ref 39.0–52.0)
Hemoglobin: 14.4 g/dL (ref 13.0–17.0)
MCH: 30.8 pg (ref 26.0–34.0)
MCHC: 33.5 g/dL (ref 30.0–36.0)
MCV: 91.9 fL (ref 78.0–100.0)
Platelets: 151 10*3/uL (ref 150–400)
RBC: 4.68 MIL/uL (ref 4.22–5.81)
RDW: 14.3 % (ref 11.5–15.5)
WBC: 10.2 10*3/uL (ref 4.0–10.5)

## 2016-03-18 LAB — PROTIME-INR
INR: 2.09 — ABNORMAL HIGH (ref 0.00–1.49)
Prothrombin Time: 23.3 seconds — ABNORMAL HIGH (ref 11.6–15.2)

## 2016-03-18 LAB — LACTATE DEHYDROGENASE: LDH: 215 U/L — ABNORMAL HIGH (ref 98–192)

## 2016-03-18 LAB — TSH: TSH: 0.138 u[IU]/mL — ABNORMAL LOW (ref 0.350–4.500)

## 2016-03-18 NOTE — Progress Notes (Addendum)
Johnny Oneill comes to clinic with his wife.   Symptom Yes No Details  Angina  x Activity:  Claudication  x How far:    Syncope  x When:  rarely with orthostatic changes  Stroke  x   Orthopnea  x How many pillows: 1  PND  x How often:  CPAP  n/a How many hrs:  Pedal edema  x   Abd fullness  x   N&V  x Good appetite; full meal   Diaphoresis  x When:  Bleeding x  Nosebleeds when not using humidifier  Urine  x Clear yellow   SOB  x Activity:  Palpitations  x When:   ICD shock  x   Hospitlizaitons  x When/where/why:  ED visit  x When/where/why:   Other MD  x When/who/why:     Activity  x No limitations   Fluid  x  about 2 L   Diet  x  No limitaions   Doppler MAP: 106 Automatic BP: 113/67 (88) Weight: 158 lbs Last Weight:  157.8 lbs HR: 76 SPO2: 97%  VAD interrogation revealed (reviewed with Johnny Oneill): Speed:  9200 Flow: 4.8 Power: 5.3 PI: 6.8 Alarms: none Events: 0 - 5 PI events every few days.  Fixed speed: 9200 Low speed limit: 8600 Primary Controller:  Replace back up battery in 26 months.  Back up controller:   Replace back up battery in 26 months.   I reviewed the LVAD parameters from today, and compared the results to the patient's prior recorded data.  No programming changes were made. The LVAD is functioning within specified parameters.  The patient performs LVAD self-test daily.  LVAD interrogation shows no change in PI events, power surges, or speed drops. LVAD equipment check completed and is in good working order. Back-up equipment not present.   Drive Line: Reports no further itching or rash since switching to guaze dressings. Dressing changes are being performed every 4 days using gauze dressing and cleaansing with SALINE WIPES (CHG allergy). Anchor in place and accurately applied. Provided 12 daily kits and 8 anchors today.   Encounter Details: Reports to clinic with wife for 2 month follow up. Feels great and has no complaints today. Denies specifically any  dizziness, cheat pain, SOB, orthopnea or swelling. Has not needed any PRN diuretics since last visit.  NCS done showing significant carpal tunnel to right hand. Wants to discuss surgical plan for carpal tunnel release. Will coordinate with his surgical team to bring here to [facility] for surgery. Otherwise cleared from HF/VAD perspective.   INR 2.09  --> Goal 2 - 3 with ASA 325 mg.. See anticoag note. Would like to try POC testing. Will fill out referral paperwork today.   LDH 215--> baseline 200 - 300   Creat1.38 ---> up from last visit 1.39; pt has not taken prn Lasix since last clinic visit.    Patient completed 1660 feet during 6 minute walk test. Tolerated well without breaks, fatigue, SOB.   Neurocognitive trail making completed incorrectly.  Winter Cardiomyopathy, Post-VAD QOL completed by by patient.    HeartMate II System Controller SW Version 7.29 upgrade performed in clinic today to accommodate new recommendations from [employer]. Consent reviewed and signed. [employer] representative Johnny Oneill) performed software upgrade. VAD Team performed elective controller exchange. Patient tolerated controller exchange well and was asymptomatic. Pump restarted as expected with stable VAD parameters on correct prescribed speed of 9200 / 8600 rpms.   Primary Controller: [phone]  [phone],  Exp: 04/05/2017)  Back up Controller: [id number] Johnny Oneill [id number], Exp: 04/15/2017)  RTC in 2 months.   Patient Instructions: 1.  No change in meds. 2.  Will call you with lab results and coumadin dosing.   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   Admitted 3/4-3/25/15 with SOB and syncopal episode. Found to have SBO and NG placed and started on TPN. On 11/06/13  developed progressive SOB and hypotension and co-ox 51% and Hgb 6.7. Started on milrinone and transfused. Echo showed nl RV function. Patient had PEA arrest and was intubated and started on pressors. CXR revelaed a R hemothorax and a R CT placed. He required VATs and evacuation of hemothorax and was then tansferred to CIR.   Follow up for Heart Failure: Overall doing very well. Feels good. Very active. Denies dyspnea. No edema, orthopnea or PND. No problems with VAD. Taking lasix PRN. Has carpal tunnel syndrome and may need surgery.  Taking all meds without problem.  No bleeding problems.  Seen by Johnny Oneill and device at [facility]. No plans to replace.    Echo 9/16 EF 20%, LVAD cannula ok moderate RV dilation with mild to moderately decreased RV systolic function.   Labs 03/05/2015 INR 2.1, Hgb 14.2, WBC 6, LDH 199, K 3.5, creatinine 1.38, BNP 230  VAD interrogation revealed:  See LVAD nurse's note above.   I reviewed the LVAD parameters from today, and compared the results to the patient's prior recorded data.  No programming changes were made.  The LVAD is functioning within specified parameters.  The patient performs LVAD self-test daily.  LVAD interrogation was negative for any significant power changes, alarms or PI events/speed drops.  LVAD equipment check completed and is in good working order.  Back-up equipment present.   LVAD education done on emergency procedures and precautions and reviewed exit site care.    Past Medical History  Diagnosis Date  . Ischemic cardiomyopathy     a. 10/09 Echo: Sev LV Dysfxn, inf/lat AK, Mod MR.  [name];  s/p Lat MI 2009 with occlusion of the LCX - treated with Promus stenting. Has total occlusion of the RCA.   . ICD (implantable cardiac defibrillator) in place     PROPHYLACTIC      medtronic  . RBBB   .  Inferior MI ([place]) "? date"; 2009  . Hypertension   . PVC (premature ventricular contraction)   . Pacemaker   . CHF (congestive heart failure) ([place])   . Shortness of breath   . Automatic implantable cardioverter-defibrillator in situ   . LVAD (left ventricular assist device) present [facility])     Current Outpatient Prescriptions  Medication Sig Dispense Refill  . aspirin EC 325 MG tablet Take 325 mg by mouth daily.    . citalopram (CELEXA) 10 MG tablet Take 1 tablet (10 mg total) by mouth daily. 90 tablet 3  . Coenzyme Q10 (COQ10) 200 MG CAPS Take 200 mg by mouth daily.    [name] ezetimibe (ZETIA) 10 MG tablet Take 1 tablet (10  mg total) by mouth daily. 90 tablet 3  . furosemide (LASIX) 20 MG tablet Take 20 mg by mouth daily as needed (Take 20 mg as daily as needed for wt > 153 lbs). Reported on 01/14/2016    . hydrALAZINE (APRESOLINE) 25 MG tablet Take 50 mg by mouth 3 (three) times daily.    [name] levothyroxine (SYNTHROID, LEVOTHROID) 150 MCG tablet Take 1 tablet 6 days a week and only 1/2 tablet on Wednesday.  Follow up in 2 months. (Patient taking differently: Take 1 tablet 6 days a week and only 1/2 tablet on Wednesday.) 90 tablet 0  . pantoprazole (PROTONIX) 40 MG tablet Take 1 tablet (40 mg total) by mouth daily. 90 tablet 3  . potassium chloride SA (K-DUR,KLOR-CON) 20 MEQ tablet Take 1 tablet (20 mEq total) by mouth daily as needed. Take one potassium pill when you take your lasix. 90 tablet 3  . Probiotic Product (PROBIOTIC PO) Take 1 capsule by mouth daily.    . rosuvastatin (CRESTOR) 20 MG tablet Take 1 tablet (20 mg total) by mouth daily. 90 tablet 3  . spironolactone (ALDACTONE) 25 MG tablet Take 1 tablet (25 mg total) by mouth daily. 90 tablet 3  . temazepam (RESTORIL) 15 MG capsule Take 1 capsule (15 mg total) by mouth at bedtime. 30 capsule 0  . warfarin (COUMADIN) 5 MG tablet Take 7.5 mg by mouth daily.    [name] warfarin (COUMADIN) 5 MG tablet Take as directed by VAD Clinic 60 tablet 6   No  current facility-administered medications for this encounter.   Facility-Administered Medications Ordered in Other Encounters  Medication Dose Route Frequency Provider Last Rate Last Dose  . etomidate (AMIDATE) injection    Anesthesia Intra-op Myna Bright, CRNA   20 mg at 02/08/14 0915  . rocuronium [facility]) injection    Anesthesia Intra-op Myna Bright, CRNA   50 mg at 02/08/14 0932  . succinylcholine (ANECTINE) injection    Anesthesia Intra-op Myna Bright, CRNA   100 mg at 02/08/14 0915    Ace inhibitors; Diovan; Lipitor; and Norvasc  REVIEW OF SYSTEMS: All systems negative except as listed in HPI, PMH and Problem list.  Vital signs: MAP 88 HR 76  Physical Exam: GENERAL: Well appearing, male who presents to clinic today in no acute distress; Wife present HEENT: normal  NECK: Supple, JVP 5-6  no bruits. Prominent CV waves  No lymphadenopathy or thyromegaly appreciated.    CARDIAC:  Mechanical heart sounds with LVAD hum present.  LUNGS:  Clear to auscultation bilaterally.  ABDOMEN:  Soft, round, nontender, positive bowel sounds x4. No distension. nontender LVAD exit site: well-healed and semi incorporated.  Dressing dry and intact. Stabilization device present and accurately applied.  Driveline dressing is being changed weekly per sterile technique. EXTREMITIES:  Warm and dry, no cyanosis, clubbing, rash.  No edema.  NEUROLOGIC:  Alert and oriented x 4.  Gait steady.  No aphasia.  No dysarthria.  Affect pleasant.     ASSESSMENT AND PLAN:   1) Chronic systolic HF: Ischemic cardiomyopathy, EF 20-25% s/p LVAD HM II implant 08/2013 for DT.  NYHA I-II symptoms. Volume status stable.  - Continue Lasix prn - MAP ok.  Continue spironolactone.   - He has not tolerated ACE-I or amlodipine in the past. - ICD at [facility]. No plans to replace.  2) LVAD: HMII 08/2013 for DT.  Doing well. LVAD parameters stable. 3) Chronic anticoagulation:  INR goal 2.0-3.0. No bleeding issues.   Continue  ASA 325 mg for LVAD. 4) HTN: MAP 70-80s. Continue current meds.  5) Carpal tunnel syndrome: He has been offered surgery using local anesthesia.  I think that this is reasonable.  Needs to do it in the hospital.   Loralie Champagne  MD  03/18/2016

## 2016-03-18 NOTE — Patient Instructions (Signed)
No medication changes today.   We will call you with your lab results today.   Come back to see [place] in 2 months.

## 2016-03-23 ENCOUNTER — Telehealth: Payer: Self-pay | Admitting: *Deleted

## 2016-03-23 NOTE — Telephone Encounter (Signed)
Spoke with patient's wife and informed her, per Dr Brett Fairy, that patient's NCS showed  moderate to severe right median neuropathy at the wrist  Which is consistent with moderate to severe right carpal tunnel syndrome. Informed her that results were sent to Dr Electa Sniff, pcp.  She verbalized understanding, appreciation.

## 2016-03-24 ENCOUNTER — Telehealth (HOSPITAL_COMMUNITY): Payer: Self-pay | Admitting: Infectious Diseases

## 2016-03-24 DIAGNOSIS — G5601 Carpal tunnel syndrome, right upper limb: Secondary | ICD-10-CM | POA: Diagnosis not present

## 2016-03-24 NOTE — Telephone Encounter (Signed)
Called re: VAD controller beeping intermittently. Reports that since he was in clinic last week when controllers were upgraded the controller has sporadically beeped. Green lights always on indicating proper function and nothing registering on the display screen.   Advised that tech support for controller upgrades will be on campus Thursday and we can have them come to clinic for controller to be evaluated by [employer].   Advised that should beeping become persistent or escalate to different quality to please call back to update [place]. Verbalized understanding and comfort with the plan of the above.   Janene Madeira, RN VAD Coordinator   Office: [phone] 24/7 Emergency VAD Pager: [phone]

## 2016-03-24 NOTE — Telephone Encounter (Signed)
Called to discuss upcoming surgery plans for carpal tunnel of the right arm. Clearance paperwork received from Dr. Alvester Morin office. Called Claiborne Billings (surgical coordinator) at [phone] ext. [phone] to discuss further re: goal INR for procedure so we can plan accordingly. Will forward to Dr. Haroldine Laws and determine plan.

## 2016-03-26 ENCOUNTER — Ambulatory Visit (HOSPITAL_COMMUNITY)
Admission: RE | Admit: 2016-03-26 | Discharge: 2016-03-26 | Disposition: A | Discharge status: Home / Self Care | Payer: Medicare Other | Source: Ambulatory Visit | Attending: Cardiology | Admitting: Cardiology

## 2016-03-26 DIAGNOSIS — Z95811 Presence of heart assist device: Secondary | ICD-10-CM

## 2016-03-26 DIAGNOSIS — Z029 Encounter for administrative examinations, unspecified: Secondary | ICD-10-CM | POA: Insufficient documentation

## 2016-03-26 NOTE — Progress Notes (Signed)
In after controller upgrade/exchange with intermittent beeping' while on batteries (see previous telephone note). Reviewed alarms on VAD and see multiple 'low voltage advisories.' During visit noted that batteries were manufactured Jan 2014. D/W [employer] Clinical Support Hinton Dyer) and determined that the beeping is likely related to the batteries being expired. Total uses on the batteries were 195 - 225 and both were calibrated. Also could be the clips as they are worn and will need to be replaced to ensure proper function.   One pack of batteries (4) and one set of battery clips (2) were provided today.   Advised to call back should the beeping persist with these changes. Will need to have another full set of 14v batteries ready for next clinic visit.   Janene Madeira, RN VAD Coordinator   Office: [phone] 24/7 Emergency VAD Pager: [phone]

## 2016-03-31 ENCOUNTER — Telehealth (HOSPITAL_COMMUNITY): Payer: Self-pay | Admitting: Infectious Diseases

## 2016-03-31 NOTE — Telephone Encounter (Signed)
Called requesting medication list for husband. Email provided.

## 2016-04-02 ENCOUNTER — Other Ambulatory Visit (HOSPITAL_COMMUNITY): Payer: Self-pay | Admitting: *Deleted

## 2016-04-02 DIAGNOSIS — Z95811 Presence of heart assist device: Secondary | ICD-10-CM

## 2016-04-02 DIAGNOSIS — Z7901 Long term (current) use of anticoagulants: Secondary | ICD-10-CM

## 2016-04-03 ENCOUNTER — Ambulatory Visit (HOSPITAL_COMMUNITY)
Admission: RE | Admit: 2016-04-03 | Discharge: 2016-04-03 | Disposition: A | Discharge status: Home / Self Care | Payer: Medicare Other | Source: Ambulatory Visit | Attending: Cardiology | Admitting: Cardiology

## 2016-04-03 ENCOUNTER — Ambulatory Visit (HOSPITAL_COMMUNITY): Payer: Self-pay | Admitting: *Deleted

## 2016-04-03 DIAGNOSIS — Z7901 Long term (current) use of anticoagulants: Secondary | ICD-10-CM

## 2016-04-03 DIAGNOSIS — Z95811 Presence of heart assist device: Secondary | ICD-10-CM | POA: Diagnosis not present

## 2016-04-03 LAB — PROTIME-INR
INR: 2.55
Prothrombin Time: 27.9 seconds — ABNORMAL HIGH (ref 11.4–15.2)

## 2016-04-03 NOTE — Progress Notes (Signed)
Provided patient with 1 set (four batteries) SN:   SR [id number] Manufacture date: 12/18/15  SR [id number]  SR [id number]  SR [id number]  Zada Girt, RN VAD Coordinator [phone] 24/7 VAD Pager: [phone]

## 2016-04-03 NOTE — Addendum Note (Signed)
Encounter addended by: Lezlie Octave, RN on: 04/03/2016  2:39 PM<BR>    Actions taken: Sign clinical note

## 2016-04-08 ENCOUNTER — Encounter: Payer: Self-pay | Admitting: Endocrinology

## 2016-04-08 ENCOUNTER — Ambulatory Visit (INDEPENDENT_AMBULATORY_CARE_PROVIDER_SITE_OTHER): Payer: Medicare Other | Admitting: Endocrinology

## 2016-04-08 VITALS — HR 70 | Wt 160.0 lb

## 2016-04-08 DIAGNOSIS — I255 Ischemic cardiomyopathy: Secondary | ICD-10-CM

## 2016-04-08 DIAGNOSIS — E890 Postprocedural hypothyroidism: Secondary | ICD-10-CM

## 2016-04-08 MED ORDER — LEVOTHYROXINE SODIUM 125 MCG PO TABS: 125.0000 ug | ORAL_TABLET | Freq: Every day | ORAL | 3 refills | Status: DC

## 2016-04-08 NOTE — Progress Notes (Signed)
Patient ID: Johnny Oneill, male   DOB: [birth year], 75 y.o.   MRN: [id number]   Reason for Appointment:  Hypothyroidism, followup visit    History of Present Illness:   The hypothyroidism was first diagnosed after radioactive iodine treatment for [name] several years ago  Previously he had lost significant amount of weight with his worsening heart failure and subsequently this stabilized More recently his weight has improved somewhat and is stable.  Previously was taking 200 mcg daily since 3/15 when he was hospitalized  Subsequently he was on steady dose of 175 mcg since 11/2013 Subsequently the dose was reduced down to 150 g in 2016 He is not usually aware of his medication dosage because his wife is giving him his prescription and on the last visit to do unclear what he was taking. He has been taking 6-1/2 tablets a week of the 150 g since about 3/17.  He was told to call back about his thyroid doses on his last visit but he did not More recently with his cardiologist he had another TSH done and this was again low along with mildly increased free T4. He does not think he has any shakiness, heat intolerance or sweating. No recent weight loss He gets his prescription from [place] consistently  Compliance with the medical regimen has been as prescribed with taking the tablet in the morning before breakfast.     Wt Readings from Last 3 Encounters:  04/08/16 160 lb (72.6 kg)  01/14/16 157 lb 12.8 oz (71.6 kg)  11/12/15 161 lb 9.6 oz (73.3 kg)    Lab Results  Component Value Date   TSH 0.138 (L) 03/18/2016   TSH 0.14 (L) 11/06/2015   TSH 0.15 (L) 02/26/2015   FREET4 1.14 (H) 03/18/2016   FREET4 1.64 (H) 11/06/2015   FREET4 1.72 (H) 02/26/2015        Medication List       Accurate as of 04/08/16  4:21 PM. Always use your most recent med list.          aspirin EC 325 MG tablet Take 325 mg by mouth daily.   citalopram 10 MG tablet Commonly  known as:  CELEXA Take 1 tablet (10 mg total) by mouth daily.   CoQ10 200 MG Caps Take 200 mg by mouth daily.   ezetimibe 10 MG tablet Commonly known as:  ZETIA Take 1 tablet (10 mg total) by mouth daily.   furosemide 20 MG tablet Commonly known as:  LASIX Take 20 mg by mouth daily as needed (Take 20 mg as daily as needed for wt > 153 lbs). Reported on 01/14/2016   hydrALAZINE 25 MG tablet Commonly known as:  APRESOLINE Take 50 mg by mouth 3 (three) times daily.   levothyroxine 150 MCG tablet Commonly known as:  SYNTHROID, LEVOTHROID Take 1 tablet 6 days a week and only 1/2 tablet on Wednesday.  Follow up in 2 months.   pantoprazole 40 MG tablet Commonly known as:  PROTONIX Take 1 tablet (40 mg total) by mouth daily.   potassium chloride SA 20 MEQ tablet Commonly known as:  K-DUR,KLOR-CON Take 1 tablet (20 mEq total) by mouth daily as needed. Take one potassium pill when you take your lasix.   PROBIOTIC PO Take 1 capsule by mouth daily.   rosuvastatin 20 MG tablet Commonly known as:  CRESTOR Take 1 tablet (20 mg total) by mouth daily.   spironolactone 25 MG tablet Commonly known as:  ALDACTONE  Take 1 tablet (25 mg total) by mouth daily.   temazepam 15 MG capsule Commonly known as:  RESTORIL Take 1 capsule (15 mg total) by mouth at bedtime.   warfarin 5 MG tablet Commonly known as:  COUMADIN Take 7.5 mg by mouth daily.   warfarin 5 MG tablet Commonly known as:  COUMADIN Take as directed by VAD Clinic        Past Medical History:  Diagnosis Date  . Anxiety   . Automatic implantable cardioverter-defibrillator in situ   . CAD (coronary artery disease)    S/P stenting of LCX in 2004;  s/p Lat MI 2009 with occlusion of the LCX - treated with Promus stenting. Has total occlusion of the RCA.   [name] CHF (congestive heart failure) ([place])   . Hypercholesteremia   . Hypertension   . Hypothyroidism   . ICD (implantable cardiac defibrillator) in place    PROPHYLACTIC       medtronic  . Inferior MI ([place]) "? date"; 2009  . Ischemic cardiomyopathy    a. 10/09 Echo: Sev LV Dysfxn, inf/lat AK, Mod MR.  [name] LVAD (left ventricular assist device) present ([place])   . Osteoarthritis    a. s/p R TKR  . Pacemaker   . PVC (premature ventricular contraction)   . RBBB   . Shortness of breath     Past Surgical History:  Procedure Laterality Date  . CORONARY ANGIOPLASTY WITH STENT PLACEMENT  2004  . CORONARY ANGIOPLASTY WITH STENT PLACEMENT  07/2008  . DEFIBRILLATOR  2009  . ESOPHAGOGASTRODUODENOSCOPY N/A 09/15/2013   Procedure: ESOPHAGOGASTRODUODENOSCOPY (EGD);  Surgeon: Ladene Artist, MD;  Location: [facility] ENDOSCOPY;  Service: Endoscopy;  Laterality: N/A;  bedside  . HEMATOMA EVACUATION Right 11/06/2013   Procedure: EVACUATION HEMATOMA;  Surgeon: Gaye Pollack, MD;  Location: [place];  Service: Cardiothoracic;  Laterality: Right;  . INSERT / REPLACE / REMOVE PACEMAKER  2009  . INSERTION OF IMPLANTABLE LEFT VENTRICULAR ASSIST DEVICE N/A 09/18/2013   Procedure: INSERTION OF IMPLANTABLE LEFT VENTRICULAR ASSIST DEVICE;  Surgeon: Ivin Poot, MD;  Location: [place];  Service: Open Heart Surgery;  Laterality: N/A;  CIRC ARREST  NITRIC OXIDE  . INTRA-AORTIC BALLOON PUMP INSERTION N/A 09/14/2013   Procedure: INTRA-AORTIC BALLOON PUMP INSERTION;  Surgeon: Jolaine Artist, MD;  Location: [facility] CATH LAB;  Service: Cardiovascular;  Laterality: N/A;  . INTRAOPERATIVE TRANSESOPHAGEAL ECHOCARDIOGRAM N/A 09/18/2013   Procedure: INTRAOPERATIVE TRANSESOPHAGEAL ECHOCARDIOGRAM;  Surgeon: Ivin Poot, MD;  Location: [place];  Service: Open Heart Surgery;  Laterality: N/A;  . KNEE ARTHROSCOPY     bilaterally  . KNEE ARTHROSCOPY W/ PARTIAL MEDIAL MENISCECTOMY  12/2002   left  . LEFT VENTRICULAR ASSIST DEVICE    . RIGHT HEART CATHETERIZATION N/A 08/25/2013   Procedure: RIGHT HEART CATH;  Surgeon: Jolaine Artist, MD;  Location: [facility] CATH LAB;  Service: Cardiovascular;  Laterality: N/A;  .  RIGHT HEART CATHETERIZATION N/A 09/13/2013   Procedure: RIGHT HEART CATH;  Surgeon: Jolaine Artist, MD;  Location: [facility] CATH LAB;  Service: Cardiovascular;  Laterality: N/A;  . RIGHT HEART CATHETERIZATION N/A 02/08/2014   Procedure: RIGHT HEART CATH;  Surgeon: Jolaine Artist, MD;  Location: [facility] CATH LAB;  Service: Cardiovascular;  Laterality: N/A;  . RIGHT HEART CATHETERIZATION N/A 02/12/2014   Procedure: RIGHT HEART CATH;  Surgeon: Jolaine Artist, MD;  Location: [facility] CATH LAB;  Service: Cardiovascular;  Laterality: N/A;  . TOTAL KNEE ARTHROPLASTY  11/27/11   left  .  TOTAL KNEE ARTHROPLASTY  11/27/2011   Procedure: TOTAL KNEE ARTHROPLASTY;  Surgeon: Yvette Rack., MD;  Location: [place];  Service: Orthopedics;  Laterality: Left;  left total knee arthroplasty  . VIDEO ASSISTED THORACOSCOPY Right 11/06/2013   Procedure: VIDEO ASSISTED THORACOSCOPY;  Surgeon: Gaye Pollack, MD;  Location: [facility] OR;  Service: Cardiothoracic;  Laterality: Right;    Family History  Problem Relation Age of Onset  . Heart failure Father   . Stroke Father   . Coronary artery disease Mother   . Heart disease Mother   . Hyperlipidemia Sister     Social History:  reports that he has never smoked. He has never used smokeless tobacco. He reports that he does not drink alcohol or use drugs.  Allergies:  Allergies  Allergen Reactions  . Ace Inhibitors     Hypotension and elevated creatinine  . Diovan [Valsartan] Other (See Comments)    Hypotension at low dose, hyperkalemia  . Lipitor [Atorvastatin Calcium] Other (See Comments)    Muscle pain  . Norvasc [Amlodipine] Other (See Comments)    Put patient to sleep   ROS:  Still using ventricle assist device and anticoagulation  Appettite is good, weight is stable  Wt Readings from Last 3 Encounters:  04/08/16 160 lb (72.6 kg)  01/14/16 157 lb 12.8 oz (71.6 kg)  11/12/15 161 lb 9.6 oz (73.3 kg)     Examination:   Pulse 70   Wt 160 lb (72.6 kg)   SpO2 95%    BMI 25.82 kg/m   Unable to get vital signs because of his ventricular assist device He looks well No tremor    Assessment/Plan:   Post ablative Hypothyroidism:  He is subjectively doing well Unable to assess his pulse Appears to have persistently low TSH levels and requiring less thyroid supplementation over the last year or so. Currently he is taking the equivalent of about 140 g of levothyroxine daily. Will reduce his dose to 125 g and have him come back in follow-up in 2 months. May consider brand name Synthroid if he has variability in his thyroid requirement  Dakai Braithwaite 04/08/2016, 4:21 PM

## 2016-04-16 ENCOUNTER — Ambulatory Visit (HOSPITAL_COMMUNITY): Payer: Self-pay | Admitting: *Deleted

## 2016-04-16 DIAGNOSIS — Z7901 Long term (current) use of anticoagulants: Secondary | ICD-10-CM | POA: Diagnosis not present

## 2016-04-16 LAB — POCT INR: INR: 2.2

## 2016-04-21 ENCOUNTER — Other Ambulatory Visit (HOSPITAL_COMMUNITY): Payer: Medicare Other

## 2016-04-23 ENCOUNTER — Ambulatory Visit (HOSPITAL_COMMUNITY): Payer: Self-pay | Admitting: Unknown Physician Specialty

## 2016-04-23 LAB — POCT INR: INR: 2.7

## 2016-04-27 ENCOUNTER — Ambulatory Visit (HOSPITAL_COMMUNITY)
Admission: RE | Admit: 2016-04-27 | Discharge: 2016-04-27 | Disposition: A | Discharge status: Home / Self Care | Payer: Medicare Other | Source: Ambulatory Visit | Attending: Cardiology | Admitting: Cardiology

## 2016-04-27 DIAGNOSIS — Z4800 Encounter for change or removal of nonsurgical wound dressing: Secondary | ICD-10-CM | POA: Diagnosis not present

## 2016-04-27 DIAGNOSIS — Z95811 Presence of heart assist device: Secondary | ICD-10-CM

## 2016-04-27 NOTE — Progress Notes (Addendum)
Patient presents to clinic today after contacting VAD Coordinator over the weekend for increased redness and reported drainage from DL exit site. Reports he changes it every 3 days usually while using gauze d/t sweating with h/o superficial yeast rash; however he went 5-6 days between this time.   Photo obtained Saturday  Clinic today after proper cleaning and dressing change.  Amy Clegg in to assess site and it appears much improved today. Denies pain, tenderness, fevers/chills. No induration, mild erythema and small crust surrounding site (baseline for him). No drainage on gauze or elicited with palpation. Nothing to culture at this point. Hold off on ABX.   Resume Q3d dressing changes and prn. Aquacel Ag strips given to patient today to use against site for now. RTC as needed or at next available clinic appointment.   8 daily dressings provided with 4 anchors today.   Janene Madeira, RN VAD Coordinator   Office: [phone] 24/7 Emergency VAD Pager: [phone]

## 2016-04-30 ENCOUNTER — Telehealth (HOSPITAL_COMMUNITY): Payer: Self-pay | Admitting: *Deleted

## 2016-04-30 NOTE — Telephone Encounter (Signed)
Called pt re: missed INR today. Pt states wife will perform fingerstick when she arrives home later this evening. He will send results to [facility].

## 2016-05-01 ENCOUNTER — Ambulatory Visit (HOSPITAL_COMMUNITY): Payer: Self-pay | Admitting: Unknown Physician Specialty

## 2016-05-01 LAB — POCT INR: INR: 2.3

## 2016-05-07 ENCOUNTER — Ambulatory Visit (HOSPITAL_COMMUNITY): Payer: Self-pay | Admitting: Unknown Physician Specialty

## 2016-05-07 DIAGNOSIS — Z7901 Long term (current) use of anticoagulants: Secondary | ICD-10-CM | POA: Diagnosis not present

## 2016-05-07 LAB — POCT INR: INR: 1.9

## 2016-05-13 ENCOUNTER — Encounter (HOSPITAL_COMMUNITY): Payer: Self-pay | Admitting: Infectious Diseases

## 2016-05-14 ENCOUNTER — Ambulatory Visit (HOSPITAL_COMMUNITY): Payer: Self-pay | Admitting: Infectious Diseases

## 2016-05-14 LAB — POCT INR: INR: 1.9

## 2016-05-19 ENCOUNTER — Encounter (HOSPITAL_COMMUNITY): Payer: Self-pay

## 2016-05-19 ENCOUNTER — Other Ambulatory Visit (HOSPITAL_COMMUNITY): Payer: Self-pay | Admitting: Infectious Diseases

## 2016-05-19 ENCOUNTER — Ambulatory Visit (HOSPITAL_COMMUNITY)
Admission: RE | Admit: 2016-05-19 | Discharge: 2016-05-19 | Disposition: A | Discharge status: Home / Self Care | Payer: Medicare Other | Source: Ambulatory Visit | Attending: Cardiology | Admitting: Cardiology

## 2016-05-19 VITALS — BP 82/0 | HR 76 | Wt 162.0 lb

## 2016-05-19 DIAGNOSIS — Z96651 Presence of right artificial knee joint: Secondary | ICD-10-CM | POA: Diagnosis not present

## 2016-05-19 DIAGNOSIS — Z7901 Long term (current) use of anticoagulants: Secondary | ICD-10-CM | POA: Diagnosis not present

## 2016-05-19 DIAGNOSIS — I5022 Chronic systolic (congestive) heart failure: Secondary | ICD-10-CM | POA: Insufficient documentation

## 2016-05-19 DIAGNOSIS — I2510 Atherosclerotic heart disease of native coronary artery without angina pectoris: Secondary | ICD-10-CM | POA: Diagnosis not present

## 2016-05-19 DIAGNOSIS — Z95811 Presence of heart assist device: Secondary | ICD-10-CM

## 2016-05-19 DIAGNOSIS — Z7982 Long term (current) use of aspirin: Secondary | ICD-10-CM | POA: Insufficient documentation

## 2016-05-19 DIAGNOSIS — E7800 Pure hypercholesterolemia, unspecified: Secondary | ICD-10-CM | POA: Insufficient documentation

## 2016-05-19 DIAGNOSIS — Z79899 Other long term (current) drug therapy: Secondary | ICD-10-CM | POA: Diagnosis not present

## 2016-05-19 DIAGNOSIS — G5600 Carpal tunnel syndrome, unspecified upper limb: Secondary | ICD-10-CM | POA: Insufficient documentation

## 2016-05-19 DIAGNOSIS — E039 Hypothyroidism, unspecified: Secondary | ICD-10-CM | POA: Diagnosis not present

## 2016-05-19 DIAGNOSIS — I252 Old myocardial infarction: Secondary | ICD-10-CM | POA: Diagnosis not present

## 2016-05-19 DIAGNOSIS — F419 Anxiety disorder, unspecified: Secondary | ICD-10-CM | POA: Diagnosis not present

## 2016-05-19 DIAGNOSIS — Z955 Presence of coronary angioplasty implant and graft: Secondary | ICD-10-CM | POA: Diagnosis not present

## 2016-05-19 DIAGNOSIS — I10 Essential (primary) hypertension: Secondary | ICD-10-CM

## 2016-05-19 DIAGNOSIS — I110 Hypertensive heart disease with heart failure: Secondary | ICD-10-CM | POA: Diagnosis not present

## 2016-05-19 LAB — COMPREHENSIVE METABOLIC PANEL
ALT: 21 U/L (ref 17–63)
AST: 27 U/L (ref 15–41)
Albumin: 3.9 g/dL (ref 3.5–5.0)
Alkaline Phosphatase: 59 U/L (ref 38–126)
Anion gap: 6 (ref 5–15)
BUN: 21 mg/dL — ABNORMAL HIGH (ref 6–20)
CO2: 24 mmol/L (ref 22–32)
Calcium: 9.1 mg/dL (ref 8.9–10.3)
Chloride: 107 mmol/L (ref 101–111)
Creatinine, Ser: 1.38 mg/dL — ABNORMAL HIGH (ref 0.61–1.24)
GFR calc Af Amer: 57 mL/min — ABNORMAL LOW (ref >60)
GFR calc non Af Amer: 50 mL/min — ABNORMAL LOW (ref >60)
Glucose, Bld: 79 mg/dL (ref 65–99)
Potassium: 4.4 mmol/L (ref 3.5–5.1)
Sodium: 137 mmol/L (ref 135–145)
Total Bilirubin: 1 mg/dL (ref 0.3–1.2)
Total Protein: 6.6 g/dL (ref 6.5–8.1)

## 2016-05-19 LAB — CBC
HCT: 44.2 % (ref 39.0–52.0)
Hemoglobin: 14.4 g/dL (ref 13.0–17.0)
MCH: 30.6 pg (ref 26.0–34.0)
MCHC: 32.6 g/dL (ref 30.0–36.0)
MCV: 94 fL (ref 78.0–100.0)
Platelets: 124 10*3/uL — ABNORMAL LOW (ref 150–400)
RBC: 4.7 MIL/uL (ref 4.22–5.81)
RDW: 14.2 % (ref 11.5–15.5)
WBC: 5.1 10*3/uL (ref 4.0–10.5)

## 2016-05-19 LAB — LACTATE DEHYDROGENASE: LDH: 264 U/L — ABNORMAL HIGH (ref 98–192)

## 2016-05-19 NOTE — Patient Instructions (Addendum)
[facility] area room 30 next Tuesday for the meeting Johnny Oneill was talking with you about today.   You can plan on coming into hospital Wednesday September 27th PENDING YOUR INR RESULTS ON Thursday. If your blood is too thick then you need to come in Tuesday for IV blood thinners.   Next appt to be scheduled after your surgery.   No medication changes. Will call you with you lab results.

## 2016-05-19 NOTE — Progress Notes (Signed)
Mr. Norwood comes to clinic with his wife.   Symptom Yes No Details  Angina  x Activity:  Claudication  x How far:    Syncope  x When:  rarely with orthostatic changes  Stroke  x   Orthopnea  x How many pillows: 1  PND  x How often:  CPAP  n/a How many hrs:  Pedal edema  x   Abd fullness  x   N&V  x Good appetite; full meal   Diaphoresis  x When:  Bleeding x  Nosebleeds when not using humidifier  Urine  x Clear yellow   SOB  x Activity:  Palpitations  x When:   ICD shock  x   Hospitlizaitons  x When/where/why:  ED visit  x When/where/why:   Other MD  x When/who/why:     Activity  x No limitations   Fluid  x  about 2 L   Diet  x  No limitaions   Doppler MAP: 82 Automatic BP: 105/65 (78) Weight: 162 lbs Last Weight:  158 lbs HR: 76 SPO2: 97%  VAD interrogation revealed (reviewed with Dr Aundra Dubin): Speed:  9200 Flow: 5.2   Power: 5.4w PI: 6.9 Alarms: none Events: 5 - 10 PI events every few days.  Fixed speed: 9200 Low speed limit: 8600 Primary Controller:  Replace back up battery in 24 months.  Back up controller:   Replace back up battery in 24 months.   I reviewed the LVAD parameters from today, and compared the results to the patient's prior recorded data.  No programming changes were made. The LVAD is functioning within specified parameters.  The patient performs LVAD self-test daily.  LVAD interrogation shows no change in PI events, power surges, or speed drops. LVAD equipment check completed and is in good working order. Back-up equipment not present.   Drive Line: Reports no further itching or rash since switching to guaze dressings. Dressing changes are being performed every 4 days using gauze dressing and cleaansing with SALINE WIPES (CHG allergy). Anchor in place and accurately applied. No dressing supplies needed at visit today. Will consider reverting back to weekly kits with [employer] once weather cools off.     Encounter Details: Reports to clinic with wife  for 2 month follow up. Feels great and has no complaints today. Denies specifically any dizziness, cheat pain, SOB, orthopnea or swelling. Has not needed any PRN diuretics in several months. Scheduled to have right carpal tunnel surgery next Friday with Dr. French Ana.   POC testing for INR to be done Thursday 05/21/16.   LDH 178 >> stable within established baseline 200 - 300. Denies tea-colored urine.   Creat1.38>> stable   Admission for next week pending INR results on Thursday. Goal 1.3 INR for surgery per [place].   Janene Madeira, RN VAD Coordinator   Office: [phone] 24/7 Emergency VAD Pager: [phone]

## 2016-05-19 NOTE — Progress Notes (Signed)
Patient ID: SHAH Oneill, male   DOB: 1941/04/06, 75 y.o.   MRN: [id number] Patient ID: Johnny Oneill, male   DOB: 03-22-1941, 75 y.o.   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   Admitted 3/4-3/25/15 with SOB and syncopal episode. Found to have SBO and NG placed and started on TPN. On 11/06/13 developed progressive SOB and hypotension and co-ox 51% and Hgb 6.7. Started on milrinone and transfused. Echo showed nl RV function. Patient had PEA arrest and was intubated and started on pressors. CXR revelaed a R hemothorax and a R CT placed. He required VATs and evacuation of hemothorax and was then tansferred to CIR.    Follow up for Heart Failure: Continues to do well. Feels good. Very active. Denies dyspnea. No edema, orthopnea or PND. No problems with VAD. Taking lasix PRN. Denies SOB/PND/Orthopnea/edema.  No bleeding problems.  Seen by Dr. Rayann Heman and device at [facility]. No plans to replace.  Plan for carpal tunnel surgery next week. Surgeon requesting INR 1.3.   Echo 9/16 EF 20%, LVAD cannula ok moderate RV dilation with mild to moderately decreased RV systolic function.   Labs 03/05/2015 INR 2.1, Hgb 14.2, WBC 6, LDH 199, K 3.5, creatinine 1.38, BNP 230     VAD interrogation revealed (reviewed with Dr Aundra Dubin): Speed:  9200 Flow: 5.2                      Power: 5.4w PI: 6.9 Alarms: none Events: 5 - 10 PI events every few days.  Fixed speed: 9200 Low speed limit: 8600 Primary Controller:  Replace back up battery in 24 months.  Back up controller:   Replace back up battery in 24 months.   I reviewed the LVAD parameters from today, and compared the results to the patient's prior recorded data.  No programming changes were made. The LVAD is functioning within specified parameters.  The patient performs LVAD self-test daily.  LVAD  interrogation shows no change in PI events, power surges, or speed drops. LVAD equipment check completed and is in good working order. Back-up equipment not present.     Past Medical History:  Diagnosis Date  . Anxiety   . Automatic implantable cardioverter-defibrillator in situ   . CAD (coronary artery disease)    S/P stenting of LCX in 2004;  s/p Lat MI 2009 with occlusion of the LCX - treated with Promus stenting. Has total occlusion of the RCA.   [name] CHF (congestive heart failure) ([place])   . Hypercholesteremia   . Hypertension   . Hypothyroidism   . ICD (implantable cardiac defibrillator) in place    PROPHYLACTIC      medtronic  . Inferior MI ([place]) "? date"; 2009  . Ischemic cardiomyopathy    a. 10/09 Echo: Sev LV Dysfxn, inf/lat AK, Mod MR.  [name] LVAD (left ventricular assist device) present ([place])   . Osteoarthritis    a. s/p R TKR  . Pacemaker   . PVC (premature ventricular contraction)   . RBBB   . Shortness of breath     Current Outpatient Prescriptions  Medication Sig Dispense Refill  . aspirin EC 325 MG tablet Take 325 mg by mouth daily.    . citalopram (CELEXA) 10 MG tablet Take 1 tablet (10 mg total) by mouth daily. [place]  tablet 3  . Coenzyme Q10 (COQ10) 200 MG CAPS Take 200 mg by mouth daily.    [name] ezetimibe (ZETIA) 10 MG tablet Take 1 tablet (10 mg total) by mouth daily. 90 tablet 3  . hydrALAZINE (APRESOLINE) 25 MG tablet Take 50 mg by mouth 3 (three) times daily.    [name] levothyroxine (SYNTHROID, LEVOTHROID) 125 MCG tablet Take 1 tablet (125 mcg total) by mouth daily. 90 tablet 3  . pantoprazole (PROTONIX) 40 MG tablet Take 1 tablet (40 mg total) by mouth daily. 90 tablet 3  . potassium chloride SA (K-DUR,KLOR-CON) 20 MEQ tablet Take 1 tablet (20 mEq total) by mouth daily as needed. Take one potassium pill when you take your lasix. 90 tablet 3  . Probiotic Product (PROBIOTIC PO) Take 1 capsule by mouth daily.    . rosuvastatin (CRESTOR) 20 MG tablet Take 1 tablet (20 mg  total) by mouth daily. 90 tablet 3  . spironolactone (ALDACTONE) 25 MG tablet Take 1 tablet (25 mg total) by mouth daily. 90 tablet 3  . temazepam (RESTORIL) 15 MG capsule Take 1 capsule (15 mg total) by mouth at bedtime. 30 capsule 0  . warfarin (COUMADIN) 5 MG tablet Take 7.5 mg by mouth daily.    [name] warfarin (COUMADIN) 5 MG tablet Take as directed by VAD Clinic 60 tablet 6  . furosemide (LASIX) 20 MG tablet Take 20 mg by mouth daily as needed (Take 20 mg as daily as needed for wt > 153 lbs). Reported on 01/14/2016     No current facility-administered medications for this encounter.    Facility-Administered Medications Ordered in Other Encounters  Medication Dose Route Frequency Provider Last Rate Last Dose  . etomidate (AMIDATE) injection    Anesthesia Intra-op Myna Bright, CRNA   20 mg at 02/08/14 0915  . rocuronium [facility]) injection    Anesthesia Intra-op Myna Bright, CRNA   50 mg at 02/08/14 0932  . succinylcholine (ANECTINE) injection    Anesthesia Intra-op Myna Bright, CRNA   100 mg at 02/08/14 0915    Ace inhibitors; Diovan [valsartan]; Lipitor [atorvastatin calcium]; and Norvasc [amlodipine]  REVIEW OF SYSTEMS: All systems negative except as listed in HPI, PMH and Problem list.      Vitals:   05/19/16 1139 05/19/16 1140  BP: 105/65 (!) 82/0  BP Location: Right Arm   Pulse: 76   SpO2: 97%   Weight: 162 lb (73.5 kg)     Vital signs:  Doppler MAP: 82 Automatic BP: 105/65 (78) Weight: 162 lbs Last Weight:  158 lbs HR: 76 SPO2: 97%   Physical Exam: GENERAL: Well appearing, male who presents to clinic today in no acute distress; Wife present HEENT: normal  NECK: Supple, JVP flat. no bruits. Prominent CV waves  No lymphadenopathy or thyromegaly appreciated.    CARDIAC:  Mechanical heart sounds with LVAD hum present.  LUNGS:  Clear to auscultation bilaterally.  ABDOMEN:  Soft, round, nontender, positive bowel sounds x4. No distension.  nontender LVAD exit site: well-healed and semi incorporated.  Dressing dry and intact. Stabilization device present and accurately applied.  Driveline dressing is being changed weekly per sterile technique. EXTREMITIES:  Warm and dry, no cyanosis, clubbing, rash.  No edema.  NEUROLOGIC:  Alert and oriented x 4.  Gait steady.  No aphasia.  No dysarthria.  Affect pleasant.     ASSESSMENT AND PLAN:   1) Chronic systolic HF: ICM, EF 99-37% s/p LVAD HM II implant 08/2013 for DT. -  NYHA I-II symptoms. Volume status stable. Continue Lasix prn - Doing well. - MAP ok.  Continue current regimen.  - He has not tolerated ACE-I or amlodipine in the past. - ICD at [facility]. No plans to replace. He atrial paces 40%. We will need to follow closely to make sure that he does not decompensate with loss of atrial pacing. He is not on any negative chronotropic agents currently.  2) LVAD: HMII 08/2013 for DT.  Doing well. LVAD parameters stable. 3) Chronic anticoagulation: -  INR goal 2.0-3.0.   No bleeding issues.   - Continue ASA 325 mg for LVAD. 4) HTN: MAP 70-80s. Continue current meds.  5) Hypothyroidism: Per Dr Dwyane Dee. Continue synthroid 150 mcg. 6) Carpal tunnel syndrome - pending surgery next week. We discussed management of anticoagulation at length. Will check INR this Thursday. If INR > 2.0 will stop coumadin on Sunday and admit Wed am for Friday surgery. If INR < 2.0 will likely need early admit for heparin bridge.    Glori Bickers  MD  05/19/2016

## 2016-05-21 ENCOUNTER — Ambulatory Visit (HOSPITAL_COMMUNITY): Payer: Self-pay | Admitting: Unknown Physician Specialty

## 2016-05-21 LAB — POCT INR: INR: 2.4

## 2016-05-25 ENCOUNTER — Ambulatory Visit: Payer: Self-pay | Admitting: Physician Assistant

## 2016-05-25 NOTE — H&P (Signed)
Johnny Oneill is an 75 y.o. male.   Chief Complaint: right wrist pain HPI: Johnny Oneill did not get any relief from his carpal tunnel symptoms from the injection.   We will do an intra-articular injection to the wrist. 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 I told him we needed to distinguish the pain from that versus the symptoms he is having. He has a decreased fine motor movement. He does not describe a ton of pain. He is given a verbal OK to consider a carpal tunnel release which in his case would have to be done as an inpatient due to the complexity of his heart issues with ventricular assist device as well as the need to be real careful with his anti-coagulation.     Past Medical History:  Diagnosis Date  . Anxiety   . Automatic implantable cardioverter-defibrillator in situ   . CAD (coronary artery disease)    S/P stenting of LCX in 2004;  s/p Lat MI 2009 with occlusion of the LCX - treated with Promus stenting. Has total occlusion of the RCA.   [name] CHF (congestive heart failure) ([place])   . Hypercholesteremia   . Hypertension   . Hypothyroidism   . ICD (implantable cardiac defibrillator) in place    PROPHYLACTIC      medtronic  . Inferior MI ([place]) "? date"; 2009  . Ischemic cardiomyopathy    a. 10/09 Echo: Sev LV Dysfxn, inf/lat AK, Mod MR.  [name] LVAD (left ventricular assist device) present ([place])   . Osteoarthritis    a. s/p R TKR  . Pacemaker   . PVC (premature ventricular contraction)   . RBBB   . Shortness of breath     Past Surgical History:  Procedure Laterality Date  . CORONARY ANGIOPLASTY WITH STENT PLACEMENT  2004  . CORONARY ANGIOPLASTY WITH STENT PLACEMENT  07/2008  . DEFIBRILLATOR  2009  . ESOPHAGOGASTRODUODENOSCOPY N/A 09/15/2013   Procedure: ESOPHAGOGASTRODUODENOSCOPY (EGD);  Surgeon: Ladene Artist, MD;  Location[facility] ENDOSCOPY;  Service: Endoscopy;  Laterality: N/A;  bedside  . HEMATOMA EVACUATION Right 11/06/2013   Procedure: EVACUATION HEMATOMA;  Surgeon: Gaye Pollack, MD;  Location: [place];  Service: Cardiothoracic;  Laterality: Right;  . INSERT / REPLACE / REMOVE PACEMAKER  2009  . INSERTION OF IMPLANTABLE LEFT VENTRICULAR ASSIST DEVICE N/A 09/18/2013   Procedure: INSERTION OF IMPLANTABLE LEFT VENTRICULAR ASSIST DEVICE;  Surgeon: Ivin Poot, MD;  Location: [place];  Service: Open Heart Surgery;  Laterality: N/A;  CIRC ARREST  NITRIC OXIDE  . INTRA-AORTIC BALLOON PUMP INSERTION N/A 09/14/2013   Procedure: INTRA-AORTIC BALLOON PUMP INSERTION;  Surgeon: Jolaine Artist, MD;  Location[facility] CATH LAB;  Service: Cardiovascular;  Laterality: N/A;  . INTRAOPERATIVE TRANSESOPHAGEAL ECHOCARDIOGRAM N/A 09/18/2013   Procedure: INTRAOPERATIVE TRANSESOPHAGEAL ECHOCARDIOGRAM;  Surgeon: Ivin Poot, MD;  Location: [place];  Service: Open Heart Surgery;  Laterality: N/A;  . KNEE ARTHROSCOPY     bilaterally  . KNEE ARTHROSCOPY W/ PARTIAL MEDIAL MENISCECTOMY  12/2002   left  . LEFT VENTRICULAR ASSIST DEVICE    . RIGHT HEART CATHETERIZATION N/A 08/25/2013   Procedure: RIGHT HEART CATH;  Surgeon: Jolaine Artist, MD;  Location: [facility] CATH LAB;  Service: Cardiovascular;  Laterality: N/A;  . RIGHT HEART CATHETERIZATION N/A 09/13/2013   Procedure: RIGHT HEART CATH;  Surgeon: Jolaine Artist, MD;  Location: [facility] CATH LAB;  Service: Cardiovascular;  Laterality: N/A;  . RIGHT HEART CATHETERIZATION N/A 02/08/2014   Procedure: RIGHT HEART  CATH;  Surgeon: Jolaine Artist, MD;  Location: [facility] CATH LAB;  Service: Cardiovascular;  Laterality: N/A;  . RIGHT HEART CATHETERIZATION N/A 02/12/2014   Procedure: RIGHT HEART CATH;  Surgeon: Jolaine Artist, MD;  Location: [facility] CATH LAB;  Service: Cardiovascular;  Laterality: N/A;  . TOTAL KNEE ARTHROPLASTY  11/27/11   left  . TOTAL KNEE ARTHROPLASTY  11/27/2011   Procedure: TOTAL KNEE ARTHROPLASTY;  Surgeon: Yvette Rack., MD;  Location: [place];  Service: Orthopedics;  Laterality: Left;  left total knee arthroplasty  . VIDEO ASSISTED  THORACOSCOPY Right 11/06/2013   Procedure: VIDEO ASSISTED THORACOSCOPY;  Surgeon: Gaye Pollack, MD;  Location[facility] OR;  Service: Cardiothoracic;  Laterality: Right;    Family History  Problem Relation Age of Onset  . Heart failure Father   . Stroke Father   . Coronary artery disease Mother   . Heart disease Mother   . Hyperlipidemia Sister    Social History:  reports that he has never smoked. He has never used smokeless tobacco. He reports that he does not drink alcohol or use drugs.  Allergies:  Allergies  Allergen Reactions  . Ace Inhibitors     Hypotension and elevated creatinine  . Diovan [Valsartan] Other (See Comments)    Hypotension at low dose, hyperkalemia  . Lipitor [Atorvastatin Calcium] Other (See Comments)    Muscle pain  . Norvasc [Amlodipine] Other (See Comments)    Put patient to sleep     (Not in a hospital admission)  No results found for this or any previous visit (from the past 48 hour(s)). No results found.  Review of Systems  Respiratory: Positive for shortness of breath.   Musculoskeletal: Positive for joint pain.  Neurological: Positive for tingling.  Endo/Heme/Allergies: Bruises/bleeds easily.  All other systems reviewed and are negative.   There were no vitals taken for this visit. Physical Exam  Constitutional: He is oriented to person, place, and time. He appears well-developed and well-nourished. No distress.  HENT:  Head: Normocephalic and atraumatic.  Nose: Nose normal.  Eyes: Conjunctivae and EOM are normal. Pupils are equal, round, and reactive to light.  Neck: Normal range of motion. Neck supple.  Cardiovascular: Normal rate and intact distal pulses.   Respiratory: Effort normal. No respiratory distress.  GI: Soft. He exhibits no distension. There is no tenderness.  Musculoskeletal:       Right wrist: He exhibits tenderness. He exhibits normal range of motion.  Positive tinels   Neurological: He is alert and oriented to person,  place, and time. No cranial nerve deficit.  Skin: Skin is warm and dry. No rash noted. No erythema.  Psychiatric: He has a normal mood and affect. His behavior is normal.     Assessment/Plan Right carpal tunnel  I told him this is an elective issue for the surgery but I have some concerns over time. He is already describing decreased ability in fine sensory differentiation and cannot handle small objects with his dominant hand.   Again, if we have been given clearance, this will be a reasonable thing to consider. I do not think an anesthetic brief sedation with a tourniquet, or a regional with a tourniquet, would put him at risk from an anesthetic point. He probably has a slightly increased risk from one complication risk due to the anti-coagulation etc.  No definitive plan, if he calls back consider scheduling a carpal tunnel on an inpatient slot based on what I have been told verbally by  the patient. We obviously need to get clearance, particularly from his cardiologist.   Chriss Czar, PA-C 05/25/2016, 12:42 PM

## 2016-05-27 ENCOUNTER — Inpatient Hospital Stay (HOSPITAL_COMMUNITY)
Admission: RE | Admit: 2016-05-27 | Discharge: 2016-06-02 | DRG: 041 | Disposition: A | Discharge status: Home / Self Care | Payer: Medicare Other | Source: Ambulatory Visit | Attending: Internal Medicine | Admitting: Internal Medicine

## 2016-05-27 ENCOUNTER — Encounter (HOSPITAL_COMMUNITY): Payer: Self-pay | Admitting: *Deleted

## 2016-05-27 DIAGNOSIS — Z95811 Presence of heart assist device: Secondary | ICD-10-CM | POA: Diagnosis not present

## 2016-05-27 DIAGNOSIS — E7800 Pure hypercholesterolemia, unspecified: Secondary | ICD-10-CM | POA: Diagnosis present

## 2016-05-27 DIAGNOSIS — I252 Old myocardial infarction: Secondary | ICD-10-CM

## 2016-05-27 DIAGNOSIS — Z7901 Long term (current) use of anticoagulants: Secondary | ICD-10-CM | POA: Diagnosis not present

## 2016-05-27 DIAGNOSIS — Z79899 Other long term (current) drug therapy: Secondary | ICD-10-CM

## 2016-05-27 DIAGNOSIS — Z8249 Family history of ischemic heart disease and other diseases of the circulatory system: Secondary | ICD-10-CM

## 2016-05-27 DIAGNOSIS — Z888 Allergy status to other drugs, medicaments and biological substances status: Secondary | ICD-10-CM

## 2016-05-27 DIAGNOSIS — I5022 Chronic systolic (congestive) heart failure: Secondary | ICD-10-CM

## 2016-05-27 DIAGNOSIS — G5600 Carpal tunnel syndrome, unspecified upper limb: Secondary | ICD-10-CM | POA: Diagnosis not present

## 2016-05-27 DIAGNOSIS — E039 Hypothyroidism, unspecified: Secondary | ICD-10-CM | POA: Diagnosis present

## 2016-05-27 DIAGNOSIS — I2510 Atherosclerotic heart disease of native coronary artery without angina pectoris: Secondary | ICD-10-CM | POA: Diagnosis not present

## 2016-05-27 DIAGNOSIS — I255 Ischemic cardiomyopathy: Secondary | ICD-10-CM | POA: Diagnosis present

## 2016-05-27 DIAGNOSIS — F419 Anxiety disorder, unspecified: Secondary | ICD-10-CM | POA: Diagnosis present

## 2016-05-27 DIAGNOSIS — I2582 Chronic total occlusion of coronary artery: Secondary | ICD-10-CM | POA: Diagnosis not present

## 2016-05-27 DIAGNOSIS — Z96651 Presence of right artificial knee joint: Secondary | ICD-10-CM | POA: Diagnosis present

## 2016-05-27 DIAGNOSIS — G5601 Carpal tunnel syndrome, right upper limb: Principal | ICD-10-CM | POA: Diagnosis present

## 2016-05-27 DIAGNOSIS — Z7982 Long term (current) use of aspirin: Secondary | ICD-10-CM

## 2016-05-27 DIAGNOSIS — I110 Hypertensive heart disease with heart failure: Secondary | ICD-10-CM | POA: Diagnosis not present

## 2016-05-27 DIAGNOSIS — I4510 Unspecified right bundle-branch block: Secondary | ICD-10-CM | POA: Diagnosis present

## 2016-05-27 DIAGNOSIS — Z955 Presence of coronary angioplasty implant and graft: Secondary | ICD-10-CM | POA: Diagnosis not present

## 2016-05-27 DIAGNOSIS — I509 Heart failure, unspecified: Secondary | ICD-10-CM | POA: Diagnosis not present

## 2016-05-27 LAB — CBC WITH DIFFERENTIAL/PLATELET
Basophils Absolute: 0 10*3/uL (ref 0.0–0.1)
Basophils Relative: 0 %
Eosinophils Absolute: 0.1 10*3/uL (ref 0.0–0.7)
Eosinophils Relative: 3 %
HCT: 43.2 % (ref 39.0–52.0)
Hemoglobin: 13.7 g/dL (ref 13.0–17.0)
Lymphocytes Relative: 23 %
Lymphs Abs: 1.2 10*3/uL (ref 0.7–4.0)
MCH: 30.1 pg (ref 26.0–34.0)
MCHC: 31.7 g/dL (ref 30.0–36.0)
MCV: 94.9 fL (ref 78.0–100.0)
Monocytes Absolute: 0.6 10*3/uL (ref 0.1–1.0)
Monocytes Relative: 11 %
Neutro Abs: 3.3 10*3/uL (ref 1.7–7.7)
Neutrophils Relative %: 63 %
Platelets: 119 10*3/uL — ABNORMAL LOW (ref 150–400)
RBC: 4.55 MIL/uL (ref 4.22–5.81)
RDW: 14.3 % (ref 11.5–15.5)
WBC: 5.2 10*3/uL (ref 4.0–10.5)

## 2016-05-27 LAB — COMPREHENSIVE METABOLIC PANEL
ALT: 19 U/L (ref 17–63)
AST: 25 U/L (ref 15–41)
Albumin: 3.9 g/dL (ref 3.5–5.0)
Alkaline Phosphatase: 67 U/L (ref 38–126)
Anion gap: 5 (ref 5–15)
BUN: 22 mg/dL — ABNORMAL HIGH (ref 6–20)
CO2: 26 mmol/L (ref 22–32)
Calcium: 9.2 mg/dL (ref 8.9–10.3)
Chloride: 108 mmol/L (ref 101–111)
Creatinine, Ser: 1.67 mg/dL — ABNORMAL HIGH (ref 0.61–1.24)
GFR calc Af Amer: 46 mL/min — ABNORMAL LOW (ref >60)
GFR calc non Af Amer: 39 mL/min — ABNORMAL LOW (ref >60)
Glucose, Bld: 122 mg/dL — ABNORMAL HIGH (ref 65–99)
Potassium: 4.4 mmol/L (ref 3.5–5.1)
Sodium: 139 mmol/L (ref 135–145)
Total Bilirubin: 0.9 mg/dL (ref 0.3–1.2)
Total Protein: 6.3 g/dL — ABNORMAL LOW (ref 6.5–8.1)

## 2016-05-27 LAB — LACTATE DEHYDROGENASE: LDH: 186 U/L (ref 98–192)

## 2016-05-27 LAB — SURGICAL PCR SCREEN
MRSA, PCR: NEGATIVE
Staphylococcus aureus: NEGATIVE

## 2016-05-27 LAB — PROTIME-INR
INR: 1.4
Prothrombin Time: 17.2 seconds — ABNORMAL HIGH (ref 11.4–15.2)

## 2016-05-27 MED ORDER — TEMAZEPAM 15 MG PO CAPS
15.0000 mg | ORAL_CAPSULE | Freq: Every day | ORAL | Status: DC
  Administered 2016-05-27 – 2016-06-01 (×6): 15 mg via ORAL
  Filled 2016-05-27 (×6): qty 1

## 2016-05-27 MED ORDER — CITALOPRAM HYDROBROMIDE 20 MG PO TABS
10.0000 mg | ORAL_TABLET | Freq: Every day | ORAL | Status: DC
  Administered 2016-05-28 – 2016-06-02 (×6): 10 mg via ORAL
  Filled 2016-05-27 (×6): qty 1

## 2016-05-27 MED ORDER — COQ10 200 MG PO CAPS: 200.0000 mg | ORAL_CAPSULE | Freq: Every day | ORAL | Status: DC

## 2016-05-27 MED ORDER — FAMOTIDINE 20 MG PO TABS
20.0000 mg | ORAL_TABLET | Freq: Two times a day (BID) | ORAL | Status: DC
  Administered 2016-05-27 – 2016-06-02 (×12): 20 mg via ORAL
  Filled 2016-05-27 (×12): qty 1

## 2016-05-27 MED ORDER — PANTOPRAZOLE SODIUM 40 MG PO TBEC
40.0000 mg | DELAYED_RELEASE_TABLET | Freq: Every day | ORAL | Status: DC
  Administered 2016-05-28 – 2016-06-02 (×6): 40 mg via ORAL
  Filled 2016-05-27 (×6): qty 1

## 2016-05-27 MED ORDER — SPIRONOLACTONE 25 MG PO TABS
25.0000 mg | ORAL_TABLET | Freq: Every day | ORAL | Status: DC
  Administered 2016-05-28 – 2016-06-02 (×6): 25 mg via ORAL
  Filled 2016-05-27 (×6): qty 1

## 2016-05-27 MED ORDER — ASPIRIN EC 325 MG PO TBEC
325.0000 mg | DELAYED_RELEASE_TABLET | Freq: Every day | ORAL | Status: DC
  Administered 2016-05-28 – 2016-06-02 (×6): 325 mg via ORAL
  Filled 2016-05-27 (×6): qty 1

## 2016-05-27 MED ORDER — EZETIMIBE 10 MG PO TABS
10.0000 mg | ORAL_TABLET | Freq: Every day | ORAL | Status: DC
  Administered 2016-05-28 – 2016-06-02 (×6): 10 mg via ORAL
  Filled 2016-05-27 (×6): qty 1

## 2016-05-27 MED ORDER — ROSUVASTATIN CALCIUM 20 MG PO TABS
20.0000 mg | ORAL_TABLET | Freq: Every day | ORAL | Status: DC
Start: 2016-05-27 — End: 2016-06-02
  Administered 2016-05-28 – 2016-06-02 (×6): 20 mg via ORAL
  Filled 2016-05-27 (×6): qty 1

## 2016-05-27 MED ORDER — HYDRALAZINE HCL 50 MG PO TABS
50.0000 mg | ORAL_TABLET | Freq: Three times a day (TID) | ORAL | Status: DC
  Administered 2016-05-27 – 2016-06-02 (×17): 50 mg via ORAL
  Filled 2016-05-27 (×17): qty 1

## 2016-05-27 MED ORDER — ACETAMINOPHEN 500 MG PO TABS: 500.0000 mg | ORAL_TABLET | Freq: Four times a day (QID) | ORAL | Status: DC | PRN

## 2016-05-27 MED ORDER — LEVOTHYROXINE SODIUM 25 MCG PO TABS
125.0000 ug | ORAL_TABLET | Freq: Every day | ORAL | Status: DC
  Administered 2016-05-28 – 2016-06-02 (×5): 125 ug via ORAL
  Filled 2016-05-27 (×6): qty 1

## 2016-05-27 MED ORDER — LEVOTHYROXINE SODIUM 25 MCG PO TABS: 125.0000 ug | ORAL_TABLET | Freq: Every day | ORAL | Status: DC

## 2016-05-27 MED ORDER — HEPARIN (PORCINE) IN NACL 100-0.45 UNIT/ML-% IJ SOLN
1000.0000 [IU]/h | INTRAMUSCULAR | Status: AC
  Administered 2016-05-27 – 2016-05-28 (×2): 1000 [IU]/h via INTRAVENOUS
  Filled 2016-05-27 (×2): qty 250

## 2016-05-27 NOTE — Progress Notes (Signed)
PHARMACIST - PHYSICIAN ORDER COMMUNICATION  CONCERNING: P&T Medication Policy on Herbal Medications  DESCRIPTION:  This patient's order for:  Co-Q 10  has been noted.  This product(s) is classified as an "herbal" or natural product. Due to a lack of definitive safety studies or FDA approval, nonstandard manufacturing practices, plus the potential risk of unknown drug-drug interactions while on inpatient medications, the Pharmacy and Therapeutics Committee does not permit the use of "herbal" or natural products of this type within [facility].   ACTION TAKEN: The pharmacy department is unable to verify this order at this time. Please reevaluate patient's clinical condition at discharge and address if the herbal or natural product(s) should be resumed at that time.  Kelvin Cellar, RPh 05/27/2016 6:21 PM

## 2016-05-27 NOTE — Progress Notes (Signed)
ANTICOAGULATION CONSULT NOTE - Initial Consult  Pharmacy Consult for heparin  Indication LVAD  Allergies  Allergen Reactions  . Ace Inhibitors     Hypotension and elevated creatinine  . Diovan [Valsartan] Other (See Comments)    Hypotension at low dose, hyperkalemia  . Lipitor [Atorvastatin Calcium] Other (See Comments)    Muscle pain  . Norvasc [Amlodipine] Other (See Comments)    Put patient to sleep    Patient Measurements: Height: 5\' 7"  (170.2 cm) Weight: 157 lb 3 oz (71.3 kg) IBW/kg (Calculated) : 66.1  Vital Signs: Temp: 98.5 F (36.9 C) (09/27 2044) Temp Source: Oral (09/27 2044) BP: 90/0 (09/27 1741)  Labs:  Recent Labs  05/27/16 2013  HGB 13.7  HCT 43.2  PLT PENDING  LABPROT 17.2*  INR 1.40  CREATININE 1.67*    Estimated Creatinine Clearance: 37.4 mL/min (by C-G formula based on SCr of 1.67 mg/dL (H)).   Medical History: Past Medical History:  Diagnosis Date  . Anxiety   . Automatic implantable cardioverter-defibrillator in situ   . CAD (coronary artery disease)    S/P stenting of LCX in 2004;  s/p Lat MI 2009 with occlusion of the LCX - treated with Promus stenting. Has total occlusion of the RCA.   [name] CHF (congestive heart failure) ([place])   . Hypercholesteremia   . Hypertension   . Hypothyroidism   . ICD (implantable cardiac defibrillator) in place    PROPHYLACTIC      medtronic  . Inferior MI ([place]) "? date"; 2009  . Ischemic cardiomyopathy    a. 10/09 Echo: Sev LV Dysfxn, inf/lat AK, Mod MR.  [name] LVAD (left ventricular assist device) present ([place])   . Osteoarthritis    a. s/p R TKR  . Pacemaker   . PVC (premature ventricular contraction)   . RBBB   . Shortness of breath     Assessment: 82 yoM s/p HM II LVAD placement on 09/19/2013 on warfarin PTA admitted for INR correction/heparin bridge for planned carpal tunnell surgery on 05/29/16.  Admit INR 1.4.    Goal of Therapy:  Heparin level 0.3-0.7 units/ml Monitor platelets by  anticoagulation protocol: Yes   Plan:  1. Begin heparin at 1000 units/hr  2. Heparin level in am  3. Daily heparin level and CBC\  Vincenza Hews, PharmD, BCPS 05/27/2016, 8:57 PM Pager: [phone]

## 2016-05-27 NOTE — H&P (Signed)
VAD TEAM History & Physical Note   Reason for Admission:  INR correction / Heparin bridging prior to surgery 05/29/16 Primary Cardiologist: Dr. Haroldine Laws   HPI:    Johnny Oneill is a 75 y.o. 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   Echo 9/16 EF 20%, LVAD cannula ok moderate RV dilation with mild to moderately decreased RV systolic function.   ICD at [facility]. Seen by Dr. Rayann Heman. No plans to replace with LVAD.   Has been doing well recently from HF standpoint.  Scheduled for Carpal Tunnel Surgery 05/27/16. Being admitted today for INR adjustment and bridging with heparin due to anticoagulation requirement for VAD.  INR goal for surgery is < 1.3.   LVAD INTERROGATION:  HeartMate II LVAD:  Flow 5.4 liters/min, speed 9200, power 5.4, PI 6.3.  Occasional PI events  Review of Systems: [y] = yes, [ ]  = no   General: Weight gain [ ] ; Weight loss [ ] ; Anorexia [ ] ; Fatigue [ ] ; Fever [ ] ; Chills [ ] ; Weakness [ ]   Cardiac: Chest pain/pressure [ ] ; Resting SOB [ ] ; Exertional SOB [ ] ; Orthopnea [ ] ; Pedal Edema [ ] ; Palpitations [ ] ; Syncope [ ] ; Presyncope [ ] ; Paroxysmal nocturnal dyspnea[ ]   Pulmonary: Cough [ ] ; Wheezing[ ] ; Hemoptysis[ ] ; Sputum [ ] ; Snoring [ ]   GI: Vomiting[ ] ; Dysphagia[ ] ; Melena[ ] ; Hematochezia [ ] ; Heartburn[ ] ; Abdominal pain [ ] ; Constipation [ ] ; Diarrhea [ ] ; BRBPR [ ]   GU: Hematuria[ ] ; Dysuria [ ] ; Nocturia[ ]   Vascular: Pain in legs with walking [ ] ; Pain in feet with lying flat [ ] ; Non-healing sores [ ] ; Stroke [ ] ; TIA [ ] ; Slurred speech [ ] ;  Neuro: Headaches[ ] ; Vertigo[ ] ; Seizures[ ] ; Paresthesias[ ] ;Blurred vision [ ] ; Diplopia [ ] ; Vision changes [ ]   Ortho/Skin: Arthritis [y]; Joint pain [y]; Muscle pain [y]; Joint swelling [ ] ; Back Pain [ ] ; Rash [ ]   Psych: Depression[ ] ; Anxiety[ ]   Heme: Bleeding problems [ ] ; Clotting disorders [ ] ; Anemia [ ]     Endocrine: Diabetes [ ] ; Thyroid dysfunction[y]    Home Medications Prior to Admission medications   Medication Sig Start Date End Date Taking? Authorizing Provider  acetaminophen (TYLENOL) 500 MG chewable tablet Chew 500 mg by mouth every 6 (six) hours as needed for pain.    Historical Provider, MD  aspirin EC 325 MG tablet Take 325 mg by mouth daily.    Historical Provider, MD  citalopram (CELEXA) 10 MG tablet Take 1 tablet (10 mg total) by mouth daily. 12/30/15   Jolaine Artist, MD  Coenzyme Q10 (COQ10) 200 MG CAPS Take 200 mg by mouth daily.    Historical Provider, MD  ezetimibe (ZETIA) 10 MG tablet Take 1 tablet (10 mg total) by mouth daily. 12/09/15   Jolaine Artist, MD  furosemide (LASIX) 20 MG tablet Take 20 mg by mouth daily as needed (Take 20 mg as daily as needed for wt > 153 lbs). Reported on 01/14/2016    Historical Provider, MD  hydrALAZINE (APRESOLINE) 25 MG tablet Take 50 mg by mouth 3 (three) times daily.    Historical Provider, MD  levothyroxine (SYNTHROID, LEVOTHROID) 125 MCG tablet Take 1 tablet (125 mcg total) by mouth daily. 04/08/16   Elayne Snare, MD  pantoprazole (PROTONIX) 40 MG tablet Take 1 tablet (  40 mg total) by mouth daily. 01/31/16   Jolaine Artist, MD  potassium chloride SA (K-DUR,KLOR-CON) 20 MEQ tablet Take 1 tablet (20 mEq total) by mouth daily as needed. Take one potassium pill when you take your lasix. 01/02/15   Jolaine Artist, MD  Probiotic Product (PROBIOTIC PO) Take 1 capsule by mouth daily.    Historical Provider, MD  ranitidine (ZANTAC) 150 MG tablet Take 150 mg by mouth daily as needed for heartburn.    Historical Provider, MD  rosuvastatin (CRESTOR) 20 MG tablet Take 1 tablet (20 mg total) by mouth daily. 05/30/15   Larey Dresser, MD  spironolactone (ALDACTONE) 25 MG tablet Take 1 tablet (25 mg total) by mouth daily. 09/23/15   Jolaine Artist, MD  temazepam (RESTORIL) 15 MG capsule Take 1 capsule (15 mg total) by mouth at bedtime. 11/01/15    Jolaine Artist, MD  warfarin (COUMADIN) 5 MG tablet Take as directed by [facility] Clinic Patient taking differently: Has stopped prior to procedure 02/25/16   Jolaine Artist, MD    Past Medical History: Past Medical History:  Diagnosis Date  . Anxiety   . Automatic implantable cardioverter-defibrillator in situ   . CAD (coronary artery disease)    S/P stenting of LCX in 2004;  s/p Lat MI 2009 with occlusion of the LCX - treated with Promus stenting. Has total occlusion of the RCA.   [name] CHF (congestive heart failure) ([place])   . Hypercholesteremia   . Hypertension   . Hypothyroidism   . ICD (implantable cardiac defibrillator) in place    PROPHYLACTIC      medtronic  . Inferior MI ([place]) "? date"; 2009  . Ischemic cardiomyopathy    a. 10/09 Echo: Sev LV Dysfxn, inf/lat AK, Mod MR.  [name] LVAD (left ventricular assist device) present ([place])   . Osteoarthritis    a. s/p R TKR  . Pacemaker   . PVC (premature ventricular contraction)   . RBBB   . Shortness of breath     Past Surgical History: Past Surgical History:  Procedure Laterality Date  . CORONARY ANGIOPLASTY WITH STENT PLACEMENT  2004  . CORONARY ANGIOPLASTY WITH STENT PLACEMENT  07/2008  . DEFIBRILLATOR  2009  . ESOPHAGOGASTRODUODENOSCOPY N/A 09/15/2013   Procedure: ESOPHAGOGASTRODUODENOSCOPY (EGD);  Surgeon: Ladene Artist, MD;  Location: [facility] ENDOSCOPY;  Service: Endoscopy;  Laterality: N/A;  bedside  . HEMATOMA EVACUATION Right 11/06/2013   Procedure: EVACUATION HEMATOMA;  Surgeon: Gaye Pollack, MD;  Location: [place];  Service: Cardiothoracic;  Laterality: Right;  . INSERT / REPLACE / REMOVE PACEMAKER  2009  . INSERTION OF IMPLANTABLE LEFT VENTRICULAR ASSIST DEVICE N/A 09/18/2013   Procedure: INSERTION OF IMPLANTABLE LEFT VENTRICULAR ASSIST DEVICE;  Surgeon: Ivin Poot, MD;  Location: [place];  Service: Open Heart Surgery;  Laterality: N/A;  CIRC ARREST  NITRIC OXIDE  . INTRA-AORTIC BALLOON PUMP INSERTION N/A 09/14/2013    Procedure: INTRA-AORTIC BALLOON PUMP INSERTION;  Surgeon: Jolaine Artist, MD;  Location: [facility] CATH LAB;  Service: Cardiovascular;  Laterality: N/A;  . INTRAOPERATIVE TRANSESOPHAGEAL ECHOCARDIOGRAM N/A 09/18/2013   Procedure: INTRAOPERATIVE TRANSESOPHAGEAL ECHOCARDIOGRAM;  Surgeon: Ivin Poot, MD;  Location: [place];  Service: Open Heart Surgery;  Laterality: N/A;  . KNEE ARTHROSCOPY     bilaterally  . KNEE ARTHROSCOPY W/ PARTIAL MEDIAL MENISCECTOMY  12/2002   left  . LEFT VENTRICULAR ASSIST DEVICE    . RIGHT HEART CATHETERIZATION N/A 08/25/2013   Procedure: RIGHT HEART CATH;  Surgeon: Jolaine Artist, MD;  Location: [facility] CATH LAB;  Service: Cardiovascular;  Laterality: N/A;  . RIGHT HEART CATHETERIZATION N/A 09/13/2013   Procedure: RIGHT HEART CATH;  Surgeon: Jolaine Artist, MD;  Location: [facility] CATH LAB;  Service: Cardiovascular;  Laterality: N/A;  . RIGHT HEART CATHETERIZATION N/A 02/08/2014   Procedure: RIGHT HEART CATH;  Surgeon: Jolaine Artist, MD;  Location: [facility] CATH LAB;  Service: Cardiovascular;  Laterality: N/A;  . RIGHT HEART CATHETERIZATION N/A 02/12/2014   Procedure: RIGHT HEART CATH;  Surgeon: Jolaine Artist, MD;  Location: [facility] CATH LAB;  Service: Cardiovascular;  Laterality: N/A;  . TOTAL KNEE ARTHROPLASTY  11/27/11   left  . TOTAL KNEE ARTHROPLASTY  11/27/2011   Procedure: TOTAL KNEE ARTHROPLASTY;  Surgeon: Yvette Rack., MD;  Location: [place];  Service: Orthopedics;  Laterality: Left;  left total knee arthroplasty  . VIDEO ASSISTED THORACOSCOPY Right 11/06/2013   Procedure: VIDEO ASSISTED THORACOSCOPY;  Surgeon: Gaye Pollack, MD;  Location: [facility] OR;  Service: Cardiothoracic;  Laterality: Right;    Family History: Family History  Problem Relation Age of Onset  . Heart failure Father   . Stroke Father   . Coronary artery disease Mother   . Heart disease Mother   . Hyperlipidemia Sister     Social History: Social History   Social History  . Marital status:  Married    Spouse name: N/A  . Number of children: 1  . Years of education: N/A   Occupational History  . referee Retired  . cleaning company    Social History Main Topics  . Smoking status: Never Smoker  . Smokeless tobacco: Never Used  . Alcohol use No  . Drug use: No     Comment: "back in our younger days we smoked a little pot"  . Sexual activity: Not Currently   Other Topics Concern  . Not on file   Social History Narrative  . No narrative on file    Allergies:  Allergies  Allergen Reactions  . Ace Inhibitors     Hypotension and elevated creatinine  . Diovan [Valsartan] Other (See Comments)    Hypotension at low dose, hyperkalemia  . Lipitor [Atorvastatin Calcium] Other (See Comments)    Muscle pain  . Norvasc [Amlodipine] Other (See Comments)    Put patient to sleep    Objective:    Vital Signs:   Temp:  [98.7 F (37.1 C)] 98.7 F (37.1 C) (09/27 1741) Resp:  [19] 19 (09/27 1741) SpO2:  [100 %] 100 % (09/27 1741) Weight:  [71.3 kg (157 lb 3 oz)] 71.3 kg (157 lb 3 oz) (09/27 1741)   Filed Weights   05/27/16 1741  Weight: 71.3 kg (157 lb 3 oz)    Mean arterial Pressure  70s  Physical Exam: GENERAL: Well appearing, NAD HEENT: Normal NECK: Supple, JVP not elevated. no bruits. Prominent CV waves  No thyromegaly or nodule noted.  CARDIAC:  Mechanical heart sounds with LVAD hum present.  LUNGS:  CTAB, normal effort ABDOMEN:  Soft, round, NT, ND, no HSM. No bruits or masses. +BS  LVAD exit site: well-healed and semi incorporated.  Dressing dry and intact. Stabilization device present and accurately applied.  Driveline dressing is being changed weekly per sterile technique. EXTREMITIES:  Warm and dry, no cyanosis, clubbing, rash.  No edema.  NEUROLOGIC:  Alert and oriented x 4.  Gait steady.  No aphasia.  No dysarthria.  Affect pleasant.     Telemetry:  Not yet connected  Labs: Basic Metabolic Panel: No results for input(s): NA, K, CL, CO2, GLUCOSE,  BUN, CREATININE, CALCIUM, MG, PHOS in the last 168 hours.  Liver Function Tests: No results for input(s): AST, ALT, ALKPHOS, BILITOT, PROT, ALBUMIN in the last 168 hours. No results for input(s): LIPASE, AMYLASE in the last 168 hours. No results for input(s): AMMONIA in the last 168 hours.  CBC: No results for input(s): WBC, NEUTROABS, HGB, HCT, MCV, PLT in the last 168 hours.  Cardiac Enzymes: No results for input(s): CKTOTAL, CKMB, CKMBINDEX, TROPONINI in the last 168 hours.  BNP: BNP (last 3 results)  Recent Labs  07/10/15 1038  BNP 346.0*    ProBNP (last 3 results) No results for input(s): PROBNP in the last 8760 hours.   CBG: No results for input(s): GLUCAP in the last 168 hours.  Coagulation Studies: No results for input(s): LABPROT, INR in the last 72 hours.  Other results: EKG: N/A  Imaging: No results found.   Assessment:   1. Chronic systolic HF ICM, EF 02-33%  2. LVAD s/p LVAD HM II implant 08/2013 for DT. 3. Chronic Anticoag 4. HTN 5. Hypothyroidism 6. Carpal Tunnel Syndrome  Plan/Discussion:    Stable from HF perspective.   Check Stat INR -> Start heparin in INR < 1.8. Timing for hold per surgeons.   Further per Dr. Haroldine Laws.  I reviewed the LVAD parameters from today, and compared the results to the patient's prior recorded data.  No programming changes were made.  The LVAD is functioning within specified parameters.  The patient performs LVAD self-test daily.  LVAD interrogation was negative for any significant power changes, alarms or PI events/speed drops.  LVAD equipment check completed and is in good working order.  Back-up equipment present.   LVAD education done on emergency procedures and precautions and reviewed exit site care.  Length of Stay: 0  [name] PA-C VAD Team Pager [phone] (7am - 7am) +++VAD ISSUES ONLY+++   Advanced Heart Failure Team Pager [phone] (M-F; 7a - 4p)  Please contact [facility] for  night-coverage after hours (4p -7a ) and weekends on [email or web address] for all non- LVAD Issues  Patient seen and examined with [name], PA-C. We discussed all aspects of the encounter. I agree with the assessment and plan as stated above.   He is stable from HF perspective. VAD parameters stable. Coumadin now on hold for carpal tunnel surgery on Friday. If INR < 2.0 start heparin without bolus.   Bensimhon, Daniel,MD 6:09 PM

## 2016-05-28 LAB — BASIC METABOLIC PANEL
Anion gap: 34 — ABNORMAL HIGH (ref 5–15)
BUN: 19 mg/dL (ref 6–20)
CO2: 18 mmol/L — ABNORMAL LOW (ref 22–32)
Calcium: 7 mg/dL — ABNORMAL LOW (ref 8.9–10.3)
Chloride: 95 mmol/L — ABNORMAL LOW (ref 101–111)
Creatinine, Ser: 1.25 mg/dL — ABNORMAL HIGH (ref 0.61–1.24)
GFR calc Af Amer: >60 mL/min (ref >60)
GFR calc non Af Amer: 56 mL/min — ABNORMAL LOW (ref >60)
Glucose, Bld: 92 mg/dL (ref 65–99)
Potassium: 3.8 mmol/L (ref 3.5–5.1)
Sodium: 147 mmol/L — ABNORMAL HIGH (ref 135–145)

## 2016-05-28 LAB — LACTATE DEHYDROGENASE: LDH: 167 U/L (ref 98–192)

## 2016-05-28 LAB — HEPARIN LEVEL (UNFRACTIONATED)
Heparin Unfractionated: 0.3 IU/mL (ref 0.30–0.70)
Heparin Unfractionated: 0.37 IU/mL (ref 0.30–0.70)

## 2016-05-28 LAB — PROTIME-INR
INR: 1.31
Prothrombin Time: 16.4 seconds — ABNORMAL HIGH (ref 11.4–15.2)

## 2016-05-28 MED ORDER — PHENOL 1.4 % MT LIQD
1.0000 | OROMUCOSAL | Status: DC | PRN
  Filled 2016-05-28: qty 177

## 2016-05-28 MED ORDER — CHLORHEXIDINE GLUCONATE 4 % EX LIQD
CUTANEOUS | Status: AC
  Filled 2016-05-28: qty 15

## 2016-05-28 MED ORDER — CHLORHEXIDINE GLUCONATE 4 % EX LIQD
60.0000 mL | Freq: Once | CUTANEOUS | Status: AC
  Administered 2016-05-29: 4 via TOPICAL
  Filled 2016-05-28: qty 15

## 2016-05-28 MED ORDER — CHLORHEXIDINE GLUCONATE 4 % EX LIQD
Freq: Once | CUTANEOUS | Status: AC
Start: 2016-05-28 — End: 2016-05-28
  Administered 2016-05-28: 4 via TOPICAL

## 2016-05-28 MED ORDER — SODIUM CHLORIDE 0.9 % IV SOLN
INTRAVENOUS | Status: DC
  Administered 2016-05-28 – 2016-05-29 (×2): via INTRAVENOUS

## 2016-05-28 MED ORDER — CEFAZOLIN SODIUM-DEXTROSE 2-4 GM/100ML-% IV SOLN
2.0000 g | INTRAVENOUS | Status: AC
  Administered 2016-05-29: 2 g via INTRAVENOUS
  Filled 2016-05-28: qty 100

## 2016-05-28 NOTE — Progress Notes (Signed)
ANTICOAGULATION CONSULT NOTE - Follow Up Consult  Pharmacy Consult for Heparin Indication: LVAD  Allergies  Allergen Reactions  . Ace Inhibitors     Hypotension and elevated creatinine  . Diovan [Valsartan] Other (See Comments)    Hypotension at low dose, hyperkalemia  . Lipitor [Atorvastatin Calcium] Other (See Comments)    Muscle pain  . Norvasc [Amlodipine] Other (See Comments)    Put patient to sleep    Patient Measurements: Height: 5\' 7"  (170.2 cm) Weight: 158 lb 4.8 oz (71.8 kg) IBW/kg (Calculated) : 66.1  Vital Signs: Temp: 97.9 F (36.6 C) (09/28 1158) Temp Source: Oral (09/28 1158) BP: 111/93 (09/28 1200) Pulse Rate: 70 (09/28 1200)  Labs:  Recent Labs  05/27/16 2013 05/28/16 0455 05/28/16 1230  HGB 13.7  --   --   HCT 43.2  --   --   PLT 119*  --   --   LABPROT 17.2* 16.4*  --   INR 1.40 1.31  --   HEPARINUNFRC  --  0.30 0.37  CREATININE 1.67*  --  1.25*    Estimated Creatinine Clearance: 49.9 mL/min (by C-G formula based on SCr of 1.25 mg/dL (H)).   Medications:  Heparin @ 1000 units/hr  Assessment: 74yom s/p LVAD continues on heparin while coumadin on hold pending carpal tunnel surgery tomorrow. Heparin level is therapeutic at 0.37. No bleeding.  Goal of Therapy:  Heparin level 0.3-0.7 units/ml Monitor platelets by anticoagulation protocol: Yes   Plan:  1) Continue heparin at 1000 units/hr 2) Daily heparin level, CBC  Deboraha Sprang 05/28/2016,2:23 PM

## 2016-05-28 NOTE — Progress Notes (Signed)
ANTICOAGULATION CONSULT NOTE - Follow Up Consult  Pharmacy Consult for heparin Indication: LVAD  Labs:  Recent Labs  05/27/16 2013 05/28/16 0455  HGB 13.7  --   HCT 43.2  --   PLT 119*  --   LABPROT 17.2* 16.4*  INR 1.40 1.31  HEPARINUNFRC  --  0.30  CREATININE 1.67*  --     Assessment/Plan:  75yo male therapeutic on heparin with initial dosing while Coumadin on hold. Will continue gtt at current rate and confirm stable with additional level.   Wynona Neat, PharmD, BCPS  05/28/2016,5:34 AM

## 2016-05-28 NOTE — Progress Notes (Signed)
HeartMate 2 Rounding Note  Subjective:    Admitted 05/26/16 for heparin bridge prior to carpal tunnel surgery 05/29/16.  INR 1.4 on admission. Now on heparin gtt.   Creatinine mildly elevated on admit at 1.6. BMET pending this am  LVAD INTERROGATION:  HeartMate II LVAD:  Flow 4.4 liters/min, speed 9200, power 5.1, PI 7.1.  Occasional PI events  Objective:    Vital Signs:   Temp:  [98 F (36.7 C)-98.7 F (37.1 C)] 98 F (36.7 C) (09/27 2300) Resp:  [13-19] 15 (09/28 0700) BP: (90-111)/(0-90) 111/86 (09/28 0400) SpO2:  [99 %-100 %] 99 % (09/27 2300) Weight:  [157 lb 3 oz (71.3 kg)-158 lb 4.8 oz (71.8 kg)] 158 lb 4.8 oz (71.8 kg) (09/28 0500) Last BM Date:  (PTA)   Mean arterial Pressure 90s  Intake/Output:   Intake/Output Summary (Last 24 hours) at 05/28/16 0743 Last data filed at 05/28/16 0500  Gross per 24 hour  Intake            197.5 ml  Output                0 ml  Net            197.5 ml     Physical Exam: General:  Elderly and Well appearing.  HEENT: Normal Neck: supple. JVP 6-7 cm. Carotids 2+ bilat; no bruits. No thyromegaly or nodule noted. . Cor: Mechanical heart sounds with LVAD hum present. Lungs: CTAB, normal effort Abdomen: soft, NT, ND, no HSM. No bruits or masses. +BS  Driveline: C/D/I; securement device intact and driveline incorporated Extremities: no cyanosis, clubbing, rash, edema Neuro: alert & orientedx3, cranial nerves grossly intact. moves all 4 extremities w/o difficulty. Affect pleasant  Telemetry: NSR  Labs: Basic Metabolic Panel:  Recent Labs Lab 05/27/16 2013  NA 139  K 4.4  CL 108  CO2 26  GLUCOSE 122*  BUN 22*  CREATININE 1.67*  CALCIUM 9.2    Liver Function Tests:  Recent Labs Lab 05/27/16 2013  AST 25  ALT 19  ALKPHOS 67  BILITOT 0.9  PROT 6.3*  ALBUMIN 3.9   No results for input(s): LIPASE, AMYLASE in the last 168 hours. No results for input(s): AMMONIA in the last 168 hours.  CBC:  Recent Labs Lab  05/27/16 2013  WBC 5.2  NEUTROABS 3.3  HGB 13.7  HCT 43.2  MCV 94.9  PLT 119*    INR:  Recent Labs Lab 05/27/16 2013 05/28/16 0455  INR 1.40 1.31    Other results:  EKG:   Imaging:  No results found.   Medications:     Scheduled Medications: . aspirin EC  325 mg Oral Daily  . citalopram  10 mg Oral Daily  . ezetimibe  10 mg Oral Daily  . famotidine  20 mg Oral BID  . hydrALAZINE  50 mg Oral TID  . levothyroxine  125 mcg Oral QAC breakfast  . pantoprazole  40 mg Oral Daily  . rosuvastatin  20 mg Oral Daily  . spironolactone  25 mg Oral Daily  . temazepam  15 mg Oral QHS     Infusions: . heparin 1,000 Units/hr (05/27/16 2115)     PRN Medications:  acetaminophen   Assessment:   1. 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 Chronic Anticoag 4. HTN 5. Hypothroidism 6. Carpal Tunnel Syndrome  Plan/Discussion:    Pt is stable for surgery in am from HF perspective.  INR 1.4 on admit. Started heparin gtt with no bolus. INR 1.3 this am.  Continue heparin.  Will restart coumadin once cleared by Surgical team.   Creatinine mildly elevated on admit.  Only takes lasix prn and it is on hold.  BMET pending this am.   I reviewed the LVAD parameters from today, and compared the results to the patient's prior recorded data.  No programming changes were made.  The LVAD is functioning within specified parameters.  The patient performs LVAD self-test daily.  LVAD interrogation was negative for any significant power changes, alarms or PI events/speed drops.  LVAD equipment check completed and is in good working order.  Back-up equipment present.   LVAD education done on emergency procedures and precautions and reviewed exit site care.  Length of Stay: 1  Annamaria Helling 05/28/2016, 7:43 AM  VAD Team --- VAD ISSUES ONLY--- Pager [phone] (7am - 7am)  Advanced Heart Failure Team  Pager [phone] (M-F; 7a - 4p)   Please contact [facility] for night-coverage after hours (4p -7a ) and weekends on [email or web address]  Patient seen and examined with [name], PA-C. We discussed all aspects of the encounter. I agree with the assessment and plan as stated above.   He is doing well. On heparin. VAD parameters stable. For carpal tunnel surgery in am. Turn heparin of at 3a.   Paulyne Mooty,MD 5:41 PM

## 2016-05-29 ENCOUNTER — Encounter (HOSPITAL_COMMUNITY)
Admission: RE | Disposition: A | Discharge status: Home / Self Care | Payer: Self-pay | Source: Ambulatory Visit | Attending: Internal Medicine

## 2016-05-29 ENCOUNTER — Inpatient Hospital Stay (HOSPITAL_COMMUNITY): Payer: Medicare Other | Admitting: Anesthesiology

## 2016-05-29 ENCOUNTER — Ambulatory Visit (HOSPITAL_COMMUNITY): Admission: RE | Admit: 2016-05-29 | Payer: Medicare Other | Source: Ambulatory Visit | Admitting: Orthopedic Surgery

## 2016-05-29 DIAGNOSIS — G5601 Carpal tunnel syndrome, right upper limb: Principal | ICD-10-CM

## 2016-05-29 HISTORY — PX: CARPAL TUNNEL RELEASE: SHX101

## 2016-05-29 LAB — CBC
HCT: 43.9 % (ref 39.0–52.0)
Hemoglobin: 13.9 g/dL (ref 13.0–17.0)
MCH: 29.8 pg (ref 26.0–34.0)
MCHC: 31.7 g/dL (ref 30.0–36.0)
MCV: 94 fL (ref 78.0–100.0)
Platelets: 120 10*3/uL — ABNORMAL LOW (ref 150–400)
RBC: 4.67 MIL/uL (ref 4.22–5.81)
RDW: 14.4 % (ref 11.5–15.5)
WBC: 5.4 10*3/uL (ref 4.0–10.5)

## 2016-05-29 LAB — BASIC METABOLIC PANEL
Anion gap: 8 (ref 5–15)
BUN: 20 mg/dL (ref 6–20)
CO2: 22 mmol/L (ref 22–32)
Calcium: 8.9 mg/dL (ref 8.9–10.3)
Chloride: 107 mmol/L (ref 101–111)
Creatinine, Ser: 1.35 mg/dL — ABNORMAL HIGH (ref 0.61–1.24)
GFR calc Af Amer: 59 mL/min — ABNORMAL LOW (ref >60)
GFR calc non Af Amer: 51 mL/min — ABNORMAL LOW (ref >60)
Glucose, Bld: 98 mg/dL (ref 65–99)
Potassium: 3.7 mmol/L (ref 3.5–5.1)
Sodium: 137 mmol/L (ref 135–145)

## 2016-05-29 LAB — PROTIME-INR
INR: 1.24
Prothrombin Time: 15.7 seconds — ABNORMAL HIGH (ref 11.4–15.2)

## 2016-05-29 LAB — LACTATE DEHYDROGENASE: LDH: 158 U/L (ref 98–192)

## 2016-05-29 LAB — HEPARIN LEVEL (UNFRACTIONATED): Heparin Unfractionated: 0.32 IU/mL (ref 0.30–0.70)

## 2016-05-29 SURGERY — CARPAL TUNNEL RELEASE
Anesthesia: Monitor Anesthesia Care | Laterality: Right

## 2016-05-29 MED ORDER — BUPIVACAINE HCL (PF) 0.25 % IJ SOLN
INTRAMUSCULAR | Status: DC | PRN
  Administered 2016-05-29: 8 mL

## 2016-05-29 MED ORDER — DOCUSATE SODIUM 100 MG PO CAPS
100.0000 mg | ORAL_CAPSULE | Freq: Two times a day (BID) | ORAL | Status: DC
  Administered 2016-05-29 – 2016-06-01 (×6): 100 mg via ORAL
  Filled 2016-05-29 (×8): qty 1

## 2016-05-29 MED ORDER — MIDAZOLAM HCL 5 MG/5ML IJ SOLN
INTRAMUSCULAR | Status: DC | PRN
  Administered 2016-05-29: .5 mg via INTRAVENOUS
  Administered 2016-05-29: 0.5 mg via INTRAVENOUS
  Administered 2016-05-29: .5 mg via INTRAVENOUS
  Administered 2016-05-29: 0.5 mg via INTRAVENOUS

## 2016-05-29 MED ORDER — CEFAZOLIN IN D5W 1 GM/50ML IV SOLN
1.0000 g | Freq: Four times a day (QID) | INTRAVENOUS | Status: AC
  Administered 2016-05-29 (×3): 1 g via INTRAVENOUS
  Filled 2016-05-29 (×3): qty 50

## 2016-05-29 MED ORDER — FENTANYL CITRATE (PF) 100 MCG/2ML IJ SOLN
INTRAMUSCULAR | Status: DC | PRN
  Administered 2016-05-29 (×4): 25 ug via INTRAVENOUS

## 2016-05-29 MED ORDER — WARFARIN SODIUM 10 MG PO TABS
10.0000 mg | ORAL_TABLET | Freq: Once | ORAL | Status: AC
  Administered 2016-05-29: 10 mg via ORAL
  Filled 2016-05-29: qty 1

## 2016-05-29 MED ORDER — METOCLOPRAMIDE HCL 5 MG/ML IJ SOLN: 5.0000 mg | Freq: Three times a day (TID) | INTRAMUSCULAR | Status: DC | PRN

## 2016-05-29 MED ORDER — WARFARIN - PHARMACIST DOSING INPATIENT
Freq: Every day | Status: DC
  Administered 2016-05-29 – 2016-06-01 (×4)

## 2016-05-29 MED ORDER — 0.9 % SODIUM CHLORIDE (POUR BTL) OPTIME
TOPICAL | Status: DC | PRN
  Administered 2016-05-29: 1000 mL

## 2016-05-29 MED ORDER — BUPIVACAINE-EPINEPHRINE (PF) 0.5% -1:200000 IJ SOLN
INTRAMUSCULAR | Status: AC
  Filled 2016-05-29: qty 30

## 2016-05-29 MED ORDER — METOCLOPRAMIDE HCL 5 MG PO TABS
5.0000 mg | ORAL_TABLET | Freq: Three times a day (TID) | ORAL | Status: DC | PRN
  Filled 2016-05-29: qty 2

## 2016-05-29 MED ORDER — HEPARIN (PORCINE) IN NACL 100-0.45 UNIT/ML-% IJ SOLN
1100.0000 [IU]/h | INTRAMUSCULAR | Status: DC
  Administered 2016-05-29 – 2016-05-31 (×3): 1000 [IU]/h via INTRAVENOUS
  Administered 2016-06-01: 1100 [IU]/h via INTRAVENOUS
  Filled 2016-05-29 (×4): qty 250

## 2016-05-29 MED ORDER — HYDROCODONE-ACETAMINOPHEN 5-325 MG PO TABS
1.0000 | ORAL_TABLET | ORAL | Status: DC | PRN
  Administered 2016-05-29 – 2016-05-30 (×6): 2 via ORAL
  Administered 2016-05-30 (×2): 1 via ORAL
  Administered 2016-05-31: 2 via ORAL
  Filled 2016-05-29 (×5): qty 2
  Filled 2016-05-29: qty 1
  Filled 2016-05-29 (×2): qty 2
  Filled 2016-05-29: qty 1

## 2016-05-29 MED ORDER — BUPIVACAINE LIPOSOME 1.3 % IJ SUSP: 20.0000 mL | INTRAMUSCULAR | Status: DC

## 2016-05-29 MED ORDER — ONDANSETRON HCL 4 MG PO TABS: 4.0000 mg | ORAL_TABLET | Freq: Four times a day (QID) | ORAL | Status: DC | PRN

## 2016-05-29 MED ORDER — ONDANSETRON HCL 4 MG/2ML IJ SOLN: 4.0000 mg | Freq: Four times a day (QID) | INTRAMUSCULAR | Status: DC | PRN

## 2016-05-29 SURGICAL SUPPLY — 32 items
BANDAGE ACE 4X5 VEL STRL LF (GAUZE/BANDAGES/DRESSINGS) ×2 IMPLANT
BANDAGE ELASTIC 3 VELCRO ST LF (GAUZE/BANDAGES/DRESSINGS) ×2 IMPLANT
BANDAGE GAUZE 4  KLING STR (GAUZE/BANDAGES/DRESSINGS) ×2 IMPLANT
BNDG CMPR 9X4 STRL LF SNTH (GAUZE/BANDAGES/DRESSINGS) ×1
BNDG CONFORM 3 STRL LF (GAUZE/BANDAGES/DRESSINGS) ×2 IMPLANT
BNDG ESMARK 4X9 LF (GAUZE/BANDAGES/DRESSINGS) ×2 IMPLANT
BNDG GAUZE ELAST 4 BULKY (GAUZE/BANDAGES/DRESSINGS) ×2 IMPLANT
CORDS BIPOLAR (ELECTRODE) ×2 IMPLANT
COVER SURGICAL LIGHT HANDLE (MISCELLANEOUS) ×2 IMPLANT
CUFF TOURNIQUET SINGLE 18IN (TOURNIQUET CUFF) ×2 IMPLANT
CUFF TOURNIQUET SINGLE 24IN (TOURNIQUET CUFF) IMPLANT
DRSG EMULSION OIL 3X3 NADH (GAUZE/BANDAGES/DRESSINGS) ×2 IMPLANT
DURAPREP 26ML APPLICATOR (WOUND CARE) ×2 IMPLANT
GAUZE SPONGE 4X4 12PLY STRL (GAUZE/BANDAGES/DRESSINGS) ×2 IMPLANT
GLOVE BIOGEL PI IND STRL 8 (GLOVE) ×2 IMPLANT
GLOVE BIOGEL PI INDICATOR 8 (GLOVE) ×2
GLOVE ORTHO TXT STRL SZ7.5 (GLOVE) ×2 IMPLANT
GLOVE SURG ORTHO 8.0 STRL STRW (GLOVE) ×2 IMPLANT
GOWN STRL REUS W/ TWL LRG LVL3 (GOWN DISPOSABLE) ×1 IMPLANT
GOWN STRL REUS W/ TWL XL LVL3 (GOWN DISPOSABLE) ×2 IMPLANT
GOWN STRL REUS W/TWL LRG LVL3 (GOWN DISPOSABLE) ×2
GOWN STRL REUS W/TWL XL LVL3 (GOWN DISPOSABLE) ×4
KIT BASIN OR (CUSTOM PROCEDURE TRAY) ×2 IMPLANT
KIT ROOM TURNOVER OR (KITS) ×2 IMPLANT
NEEDLE HYPO 25X1 1.5 SAFETY (NEEDLE) IMPLANT
NS IRRIG 1000ML POUR BTL (IV SOLUTION) ×2 IMPLANT
PACK ORTHO EXTREMITY (CUSTOM PROCEDURE TRAY) ×2 IMPLANT
PAD ARMBOARD 7.5X6 YLW CONV (MISCELLANEOUS) ×2 IMPLANT
SUT ETHILON 4 0 PS 2 18 (SUTURE) ×4 IMPLANT
SYR CONTROL 10ML LL (SYRINGE) IMPLANT
TOWEL OR 17X24 6PK STRL BLUE (TOWEL DISPOSABLE) ×2 IMPLANT
UNDERPAD 30X30 (UNDERPADS AND DIAPERS) ×2 IMPLANT

## 2016-05-29 NOTE — Progress Notes (Signed)
HeartMate 2 Rounding Note  Subjective:    Admitted 05/26/16 for heparin bridge prior to carpal tunnel surgery 05/29/16.  S/p Carpal Tunnel release this am.   INR 1.24 this am.  Plan on resuming Heparin without bolus 6 hrs after surgery (~1500) and coumadin this evening.    Creatinine stable.   LVAD INTERROGATION:  HeartMate II LVAD:  Flow 4.4 liters/min, speed 9200, power 5.1, PI 7.1.  Occasional PI events  Objective:    Vital Signs:   Temp:  [97.9 F (36.6 C)-98.5 F (36.9 C)] 98.1 F (36.7 C) (09/29 0400) Pulse Rate:  [70-85] 70 (09/29 0700) Resp:  [13-30] 20 (09/29 0700) BP: (97-111)/(83-93) 97/83 (09/29 0845) SpO2:  [96 %-97 %] 96 % (09/29 0845) Weight:  [155 lb 6.4 oz (70.5 kg)] 155 lb 6.4 oz (70.5 kg) (09/29 0700) Last BM Date: 05/28/16   Mean arterial Pressure 90s  Intake/Output:   Intake/Output Summary (Last 24 hours) at 05/29/16 0854 Last data filed at 05/29/16 0846  Gross per 24 hour  Intake          1559.17 ml  Output              853 ml  Net           706.17 ml     Physical Exam: General:  Elderly and Well appearing.  HEENT: Normal Neck: supple. JVP 6-7 cm. Carotids 2+ bilat; no bruits. No thyromegaly or nodule noted. . Cor: Mechanical heart sounds with LVAD hum present. Lungs: CTAB, normal effort Abdomen: soft, NT, ND, no HSM. No bruits or masses. +BS  Driveline: C/D/I; securement device intact and driveline incorporated Extremities: no cyanosis, clubbing, rash, edema left wrist/hand in bandage Neuro: alert & orientedx3, cranial nerves grossly intact. moves all 4 extremities w/o difficulty. Affect pleasant  Telemetry: Reviewed, NSR  Labs: Basic Metabolic Panel:  Recent Labs Lab 05/27/16 2013 05/28/16 1230 05/29/16 0319  NA 139 147* 137  K 4.4 3.8 3.7  CL 108 95* 107  CO2 26 18* 22  GLUCOSE 122* 92 98  BUN 22* 19 20  CREATININE 1.67* 1.25* 1.35*  CALCIUM 9.2 7.0* 8.9    Liver Function Tests:  Recent Labs Lab 05/27/16 2013  AST 25   ALT 19  ALKPHOS 67  BILITOT 0.9  PROT 6.3*  ALBUMIN 3.9   No results for input(s): LIPASE, AMYLASE in the last 168 hours. No results for input(s): AMMONIA in the last 168 hours.  CBC:  Recent Labs Lab 05/27/16 2013 05/29/16 0319  WBC 5.2 5.4  NEUTROABS 3.3  --   HGB 13.7 13.9  HCT 43.2 43.9  MCV 94.9 94.0  PLT 119* 120*    INR:  Recent Labs Lab 05/27/16 2013 05/28/16 0455 05/29/16 0319  INR 1.40 1.31 1.24    Other results:  EKG:   Imaging: No results found.   Medications:     Scheduled Medications: . aspirin EC  325 mg Oral Daily  .  ceFAZolin (ANCEF) IV  1 g Intravenous Q6H  . chlorhexidine      . citalopram  10 mg Oral Daily  . docusate sodium  100 mg Oral BID  . ezetimibe  10 mg Oral Daily  . famotidine  20 mg Oral BID  . hydrALAZINE  50 mg Oral TID  . levothyroxine  125 mcg Oral QAC breakfast  . pantoprazole  40 mg Oral Daily  . rosuvastatin  20 mg Oral Daily  . spironolactone  25 mg Oral Daily  .  temazepam  15 mg Oral QHS  . warfarin  10 mg Oral ONCE-1800  . Warfarin - Pharmacist Dosing Inpatient   Does not apply q1800    Infusions: . heparin      PRN Medications: acetaminophen, HYDROcodone-acetaminophen, metoCLOPramide **OR** metoCLOPramide (REGLAN) injection, ondansetron **OR** ondansetron (ZOFRAN) IV, phenol   Assessment:   1. Chronic systolic HF ICM, EF 16-94% 2. 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 Chronic Anticoag 4. HTN 5. Hypothroidism 6. Carpal Tunnel Syndrome  Plan/Discussion:    S/p Carpal Tunnel Surgery am of 05/29/16.  INR 1.24. Resuming heparin this afternoon and coumadin this evening. Will likely be in until Sunday/ Monday with heparin bridging to therapeutic INR.   Creatinine improved.  His lasix is prn only.   Advance Diet as tolerated.   I reviewed the LVAD parameters from today, and compared the results to the patient's prior recorded data.  No programming changes were made.  The LVAD is functioning  within specified parameters.  The patient performs LVAD self-test daily.  LVAD interrogation was negative for any significant power changes, alarms or PI events/speed drops.  LVAD equipment check completed and is in good working order.  Back-up equipment present.   LVAD education done on emergency procedures and precautions and reviewed exit site care.  Length of Stay: 2  Annamaria Helling 05/29/2016, 8:54 AM  VAD Team --- VAD ISSUES ONLY--- Pager [phone] (7am - 7am)  Advanced Heart Failure Team  Pager [phone] (M-F; 7a - 4p)  Please contact [facility] for night-coverage after hours (4p -7a ) and weekends on [email or web address]   Patient seen and examined with [name], PA-C. We discussed all aspects of the encounter. I agree with the assessment and plan as stated above.   Doing well s/p carpal tunnel release. Would start heparin later this evening. No bolus. Resume coumadin. Home when INR 1.8 or greater. VAD parameters stable.   Tuff Clabo,MD 11:44 AM

## 2016-05-29 NOTE — Anesthesia Postprocedure Evaluation (Signed)
Anesthesia Post Note  Patient: Johnny Oneill  Procedure(s) Performed: Procedure(s) (LRB): CARPAL TUNNEL RELEASE (Right)  Patient location during evaluation: ICU Anesthesia Type: MAC and Bier Block Level of consciousness: awake, awake and alert and oriented Pain management: pain level controlled Vital Signs Assessment: post-procedure vital signs reviewed and stable Respiratory status: spontaneous breathing, nonlabored ventilation and respiratory function stable Cardiovascular status: blood pressure returned to baseline Anesthetic complications: no    Last Vitals:  Vitals:   05/29/16 1600 05/29/16 2000  BP: (!) 86/76 101/75  Pulse: 64   Resp: 16   Temp: 36.6 C     Last Pain:  Vitals:   05/29/16 1600  TempSrc: Oral  PainSc: 3                  Tyshell Ramberg COKER

## 2016-05-29 NOTE — Brief Op Note (Signed)
05/27/2016 - 05/29/2016  8:37 AM  PATIENT:  Johnny Oneill  75 y.o. male  PRE-OPERATIVE DIAGNOSIS:  RIGHT CARPAL TUNNEL SYNDROME  POST-OPERATIVE DIAGNOSIS:  RIGHT CARPAL TUNNEL SYNDROME  PROCEDURE:  Procedure(s): CARPAL TUNNEL RELEASE (Right)  SURGEON:  Surgeon(s) and Role:    * Earlie Server, MD - Primary  PHYSICIAN ASSISTANT: Chriss Czar, PA-C  ASSISTANTS:   ANESTHESIA:   IV sedation and IV xylocaine  EBL:  No intake/output data recorded.  BLOOD ADMINISTERED:none  DRAINS: none   LOCAL MEDICATIONS USED:  MARCAINE     SPECIMEN:  No Specimen  DISPOSITION OF SPECIMEN:  N/A  COUNTS:  YES  TOURNIQUET:  * Missing tourniquet times found for documented tourniquets in log:  [id number] *  DICTATION: .Other Dictation: Dictation Number unknown  PLAN OF CARE: admitted under cardiovascular service  PATIENT DISPOSITION:  PACU - hemodynamically stable.   Delay start of Pharmacological VTE agent (>24hrs) due to surgical blood loss or risk of bleeding: per vasc.

## 2016-05-29 NOTE — Progress Notes (Signed)
ANTICOAGULATION CONSULT NOTE - Follow Up Consult  Pharmacy Consult for Heparin and Coumadin Indication: LVAD  Allergies  Allergen Reactions  . Ace Inhibitors     Hypotension and elevated creatinine  . Diovan [Valsartan] Other (See Comments)    Hypotension at low dose, hyperkalemia  . Lipitor [Atorvastatin Calcium] Other (See Comments)    Muscle pain  . Norvasc [Amlodipine] Other (See Comments)    Put patient to sleep    Patient Measurements: Height: 5\' 7"  (170.2 cm) Weight: 155 lb 6.4 oz (70.5 kg) IBW/kg (Calculated) : 66.1  Vital Signs: Temp: 98.1 F (36.7 C) (09/29 0400) Temp Source: Oral (09/29 0400) Pulse Rate: 70 (09/29 0700)  Labs:  Recent Labs  05/27/16 2013 05/28/16 0455 05/28/16 1230 05/29/16 0319  HGB 13.7  --   --  13.9  HCT 43.2  --   --  43.9  PLT 119*  --   --  120*  LABPROT 17.2* 16.4*  --  15.7*  INR 1.40 1.31  --  1.24  HEPARINUNFRC  --  0.30 0.37 0.32  CREATININE 1.67*  --  1.25* 1.35*    Estimated Creatinine Clearance: 46.2 mL/min (by C-G formula based on SCr of 1.35 mg/dL (H)).  Assessment: 74yom s/p LVAD admitted for carpal tunnel surgery. Coumadin held on admit and he was bridged with IV heparin. He is now s/p surgery, coumadin to resume tonight along with heparin bridge. His previous heparin levels were all therapeutic on 1000 units/hr. INR 1.24. CBC and LDH stable.   Home dose: 7.5mg  daily except 10mg  on Sunday/Thursday  Goal of Therapy:  INR 2-3 Heparin level 0.3-0.7 units/ml Monitor platelets by anticoagulation protocol: Yes   Plan:  1) At 1500 today, resume heparin at 1000 units/hr 2) Coumadin 10mg  x 1 tonight 3) Daily heparin level, INR, CBC, LDH  Deboraha Sprang 05/29/2016,8:47 AM

## 2016-05-29 NOTE — Progress Notes (Signed)
VAD Coordinator Procedure Note:   Patient underwent right carpal tunnel surgery. Hemodynamics and VAD parameters monitored by me throughout the procedure. Blood pressure was monitored with automatic cuff on LUE as the doppler was reflecting the modified systolic. All of his primary and back up equipment was present during the procedure as well as myself to assist with management.   Pre-procedure:  0745 - Auto BP: 115/95 (105)  Doppler: 112   9200 rpm / 4.1 lpm / 7.6 / 5.1w  Sedation Induction:Versed / Fentanyl IVP per CRNA; regional Bier block   0800 - 9200 rpm / 4.3 lpm / 7.2 / 5.1w 106/92 (98)  0810 - 9200 rpm / 4.0 lpm / 5.4 / 4.9w 104/77 (87)  0815 - 9200 rpm / 4.1 lpm / 7.0 / 5.2w 95/76 (84)  0820 - 9200 rpm / 3.8 lpm / 4.5 / 5.1w MAP 80  Interventions: none  Recovery area:  0830 - 9200 rpm / 4.7 lpm / 7.0 / 5.2w 104/90 (96)   Patient tolerated the procedure well. VAD parameters and hemodynamics were stable throughout the case with PI ranging from 4.0 - 5.5 after conscious sedation applied. Tourniquet was used to control bleeding.   Patient Disposition: in the care of his RN on 2W28.   D/W Dr. French Ana plan for anticoagulation after procedure. OK to resume warfarin today. Will resume heparin at previous rate 1000 units / h w/o bolus at 3pm today (6h after). Total time w/o anticoagulation will be 10 hours.   Janene Madeira, RN VAD Coordinator   Office: [phone] 24/7 VAD Pager: [phone]

## 2016-05-29 NOTE — Transfer of Care (Signed)
Immediate Anesthesia Transfer of Care Note  Patient: Johnny Oneill  Procedure(s) Performed: Procedure(s): CARPAL TUNNEL RELEASE (Right)  Patient Location: ICU  Anesthesia Type:MAC and Bier block  Level of Consciousness: awake, alert  and oriented  Airway & Oxygen Therapy: Patient Spontanous Breathing  Post-op Assessment: Report given to RN, Post -op Vital signs reviewed and stable and Patient moving all extremities  Post vital signs: Reviewed and stable  Last Vitals:  Vitals:   05/29/16 0400 05/29/16 0700  BP:    Pulse:  70  Resp:  20  Temp: 36.7 C     Last Pain:  Vitals:   05/29/16 0400  TempSrc: Oral         Complications: No apparent anesthesia complications

## 2016-05-29 NOTE — Anesthesia Preprocedure Evaluation (Addendum)
Anesthesia Evaluation  Patient identified by MRN, date of birth, ID band Patient awake    Reviewed: Allergy & Precautions, NPO status , Patient's Chart, lab work & pertinent test results  Airway Mallampati: II  TM Distance: >3 FB     Dental  (+) Teeth Intact, Dental Advisory Given   Pulmonary    breath sounds clear to auscultation       Cardiovascular hypertension,  Rhythm:Regular     Neuro/Psych    GI/Hepatic   Endo/Other    Renal/GU      Musculoskeletal   Abdominal   Peds  Hematology   Anesthesia Other Findings   Reproductive/Obstetrics                            Anesthesia Physical Anesthesia Plan  ASA: III  Anesthesia Plan: MAC and Bier Block   Post-op Pain Management:    Induction: Intravenous  Airway Management Planned: Natural Airway and Nasal Cannula  Additional Equipment:   Intra-op Plan:   Post-operative Plan:   Informed Consent: I have reviewed the patients History and Physical, chart, labs and discussed the procedure including the risks, benefits and alternatives for the proposed anesthesia with the patient or authorized representative who has indicated his/her understanding and acceptance.     Plan Discussed with: CRNA and Anesthesiologist  Anesthesia Plan Comments:         Anesthesia Quick Evaluation

## 2016-05-29 NOTE — Progress Notes (Signed)
Admitted 05/27/16 due to heparin bridging for elective right carpal tunnel surgery.   HeartMate II LVAD implated on 09/18/13 by PVT for Destination Therapy.   Vital signs: HR: 60s with frequent PVCs on tele  Doppler Pressure: 95 Automated BP: 106/92 (98) O2 Sat: 99% Wt:     LVAD interrogation reveals:  Speed: 9200 Flow: 4.4 Power: 5.1w PI: 7.6 Alarms: none Events:  rare Fixed speed: 9200 Low speed limit: 8600  Drive Line: C/D/I. Changed day before coming in.   Labs:  LDH trend: 186 > 167 > 158  INR trend: 1.4 > 1.31 > 1.24   Anticoagulation Plan: -Home INR monitoring in place -Goal 2 - 3 -ASA Dose: 325 mg -Last dose Coumadin Sunday 05/24/16 w/ heparin added Wednesday PM. Heparin off since 0300 for surgery and will resume at 3pm today.   Adverse Events on VAD: -none  Plan/Recommendations:  1. Monitor surgical site for bleeding.   2. INR will drive D/C date barring no events in hospital.   Janene Madeira, RN VAD Coordinator   Office: [phone] 24/7 Emergency VAD Pager: [phone]

## 2016-05-30 LAB — CBC
HCT: 42.1 % (ref 39.0–52.0)
Hemoglobin: 13.4 g/dL (ref 13.0–17.0)
MCH: 29.9 pg (ref 26.0–34.0)
MCHC: 31.8 g/dL (ref 30.0–36.0)
MCV: 94 fL (ref 78.0–100.0)
Platelets: 105 10*3/uL — ABNORMAL LOW (ref 150–400)
RBC: 4.48 MIL/uL (ref 4.22–5.81)
RDW: 14.6 % (ref 11.5–15.5)
WBC: 5.7 10*3/uL (ref 4.0–10.5)

## 2016-05-30 LAB — PROTIME-INR
INR: 1.1
Prothrombin Time: 14.3 seconds (ref 11.4–15.2)

## 2016-05-30 LAB — BASIC METABOLIC PANEL
Anion gap: 7 (ref 5–15)
BUN: 17 mg/dL (ref 6–20)
CO2: 26 mmol/L (ref 22–32)
Calcium: 8.8 mg/dL — ABNORMAL LOW (ref 8.9–10.3)
Chloride: 106 mmol/L (ref 101–111)
Creatinine, Ser: 1.4 mg/dL — ABNORMAL HIGH (ref 0.61–1.24)
GFR calc Af Amer: 56 mL/min — ABNORMAL LOW (ref >60)
GFR calc non Af Amer: 49 mL/min — ABNORMAL LOW (ref >60)
Glucose, Bld: 111 mg/dL — ABNORMAL HIGH (ref 65–99)
Potassium: 4 mmol/L (ref 3.5–5.1)
Sodium: 139 mmol/L (ref 135–145)

## 2016-05-30 LAB — HEPARIN LEVEL (UNFRACTIONATED): Heparin Unfractionated: 0.36 IU/mL (ref 0.30–0.70)

## 2016-05-30 LAB — LACTATE DEHYDROGENASE: LDH: 153 U/L (ref 98–192)

## 2016-05-30 MED ORDER — WARFARIN SODIUM 10 MG PO TABS
10.0000 mg | ORAL_TABLET | Freq: Once | ORAL | Status: AC
  Administered 2016-05-30: 10 mg via ORAL
  Filled 2016-05-30: qty 1

## 2016-05-30 NOTE — Progress Notes (Signed)
When patient stood up to urinate, his heart rate sped up to 150-160 with ST/SVT. As I walked into the room patient was dizzy and about to passed out. Patient was helped back to the bed and laid flat. Heart rate dropped to his normal of 70s and he became more responsive. Patient advised not to get out of the bed without assistance. BP is 98/80 with a map of 87, consistent with his previous readings.

## 2016-05-30 NOTE — Progress Notes (Signed)
Subjective: 1 Day Post-Op Procedure(s) (LRB): CARPAL TUNNEL RELEASE (Right) Patient reports pain as mild.  Throbbing type pain earlier but improved with pain med.  Objective: Vital signs in last 24 hours: Temp:  [96.4 F (35.8 C)-98.7 F (37.1 C)] 98.3 F (36.8 C) (09/30 0759) Pulse Rate:  [36-116] 55 (09/30 0800) Resp:  [11-26] 16 (09/30 0759) BP: (86-103)/(64-84) 103/64 (09/30 0759) SpO2:  [95 %-100 %] 100 % (09/30 0800)  Intake/Output from previous day: 09/29 0701 - 09/30 0700 In: 340 [I.V.:240; IV Piggyback:100] Out: 1203 [Urine:1200; Blood:3] Intake/Output this shift: No intake/output data recorded.   Recent Labs  05/27/16 2013 05/29/16 0319 05/30/16 0548  HGB 13.7 13.9 13.4    Recent Labs  05/29/16 0319 05/30/16 0548  WBC 5.4 5.7  RBC 4.67 4.48  HCT 43.9 42.1  PLT 120* 105*    Recent Labs  05/29/16 0319 05/30/16 0548  NA 137 139  K 3.7 4.0  CL 107 106  CO2 22 26  BUN 20 17  CREATININE 1.35* 1.40*  GLUCOSE 98 111*  CALCIUM 8.9 8.8*    Recent Labs  05/29/16 0319 05/30/16 0548  INR 1.24 1.10    Incision: dressing C/D/I  Assessment/Plan: 1 Day Post-Op Procedure(s) (LRB): CARPAL TUNNEL RELEASE (Right) NWB R hand dispo per vasc Elevate/ice operative hand  Johnny Oneill 05/30/2016, 9:15 AM

## 2016-05-30 NOTE — Progress Notes (Signed)
ANTICOAGULATION CONSULT NOTE - Follow Up Consult  Pharmacy Consult for Heparin and Coumadin Indication: LVAD  Allergies  Allergen Reactions  . Ace Inhibitors     Hypotension and elevated creatinine  . Diovan [Valsartan] Other (See Comments)    Hypotension at low dose, hyperkalemia  . Lipitor [Atorvastatin Calcium] Other (See Comments)    Muscle pain  . Norvasc [Amlodipine] Other (See Comments)    Put patient to sleep    Patient Measurements: Height: 5\' 7"  (170.2 cm) Weight: 155 lb 6.4 oz (70.5 kg) IBW/kg (Calculated) : 66.1  Vital Signs: Temp: 96.4 F (35.8 C) (09/29 2300) Temp Source: Axillary (09/29 2300) BP: 98/80 (09/30 0400) Pulse Rate: 65 (09/29 2300)  Labs:  Recent Labs  05/27/16 2013  05/28/16 0455 05/28/16 1230 05/29/16 0319 05/30/16 0548  HGB 13.7  --   --   --  13.9 13.4  HCT 43.2  --   --   --  43.9 42.1  PLT 119*  --   --   --  120* 105*  LABPROT 17.2*  --  16.4*  --  15.7* 14.3  INR 1.40  --  1.31  --  1.24 1.10  HEPARINUNFRC  --   < > 0.30 0.37 0.32 0.36  CREATININE 1.67*  --   --  1.25* 1.35* 1.40*  < > = values in this interval not displayed.  Estimated Creatinine Clearance: 44.6 mL/min (by C-G formula based on SCr of 1.4 mg/dL (H)).  Assessment: 74yom s/p LVAD admitted for carpal tunnel surgery. Warfarin held on admit and he was bridged with IV heparin. He is now s/p surgery continuing warfarin with heparin bridge. His previous heparin levels were all therapeutic on 1000 units/hr.  Heparin level 0.36 and therapeutic. INR 1.1. CBC stable. LDH stable.   Home dose: 7.5mg  daily except 10mg  on Sunday/Thursday  Goal of Therapy:  INR 2-3 Heparin level 0.3-0.7 units/ml Monitor platelets by anticoagulation protocol: Yes   Plan:  1) Continue heparin at 1000 units/hr 2) Warfarin 10mg  x 1 tonight 3) Daily heparin level, INR, CBC, LDH  Angela Burke, PharmD Pharmacy Resident Pager: [phone] 05/30/2016,7:28 AM

## 2016-05-30 NOTE — Progress Notes (Signed)
Patient ID: Johnny Oneill, male   DOB: 10-Oct-1940, 75 y.o.   MRN: [id number] HeartMate 2 Rounding Note  Subjective:    Admitted 05/26/16 for heparin bridge prior to carpal tunnel surgery 05/29/16.  S/p Carpal Tunnel release 9/29, no surgical complications.   INR 1.1 this am. Now on heparin gtt + warfarin.    Creatinine stable.   LVAD INTERROGATION:  HeartMate II LVAD:  Flow 4.0 liters/min, speed 9200, power 4.8, PI 6.3.  2 PI events/24 hrs  Objective:    Vital Signs:   Temp:  [96.4 F (35.8 C)-98.7 F (37.1 C)] 98.3 F (36.8 C) (09/30 0759) Pulse Rate:  [36-116] 53 (09/30 0755) Resp:  [11-26] 16 (09/30 0759) BP: (86-103)/(64-84) 103/64 (09/30 0759) SpO2:  [95 %-99 %] 99 % (09/30 0755) Last BM Date: 05/28/16   Mean arterial Pressure 80s  Intake/Output:   Intake/Output Summary (Last 24 hours) at 05/30/16 0855 Last data filed at 05/30/16 0500  Gross per 24 hour  Intake              240 ml  Output             1200 ml  Net             -960 ml     Physical Exam: General:  Elderly and Well appearing.  HEENT: Normal Neck: supple. JVP 6-7 cm. Carotids 2+ bilat; no bruits. No thyromegaly or nodule noted. . Cor: Mechanical heart sounds with LVAD hum present. Lungs: CTAB, normal effort Abdomen: soft, NT, ND, no HSM. No bruits or masses. +BS  Driveline: C/D/I; securement device intact and driveline incorporated Extremities: no cyanosis, clubbing, rash, edema left wrist/hand in bandage Neuro: alert & orientedx3, cranial nerves grossly intact. moves all 4 extremities w/o difficulty. Affect pleasant  Telemetry: Reviewed, NSR  Labs: Basic Metabolic Panel:  Recent Labs Lab 05/27/16 2013 05/28/16 1230 05/29/16 0319 05/30/16 0548  NA 139 147* 137 139  K 4.4 3.8 3.7 4.0  CL 108 95* 107 106  CO2 26 18* 22 26  GLUCOSE 122* 92 98 111*  BUN 22* 19 20 17   CREATININE 1.67* 1.25* 1.35* 1.40*  CALCIUM 9.2 7.0* 8.9 8.8*    Liver Function Tests:  Recent Labs Lab  05/27/16 2013  AST 25  ALT 19  ALKPHOS 67  BILITOT 0.9  PROT 6.3*  ALBUMIN 3.9   No results for input(s): LIPASE, AMYLASE in the last 168 hours. No results for input(s): AMMONIA in the last 168 hours.  CBC:  Recent Labs Lab 05/27/16 2013 05/29/16 0319 05/30/16 0548  WBC 5.2 5.4 5.7  NEUTROABS 3.3  --   --   HGB 13.7 13.9 13.4  HCT 43.2 43.9 42.1  MCV 94.9 94.0 94.0  PLT 119* 120* 105*    INR:  Recent Labs Lab 05/27/16 2013 05/28/16 0455 05/29/16 0319 05/30/16 0548  INR 1.40 1.31 1.24 1.10    Other results:  EKG:   Imaging: No results found.   Medications:     Scheduled Medications: . aspirin EC  325 mg Oral Daily  . citalopram  10 mg Oral Daily  . docusate sodium  100 mg Oral BID  . ezetimibe  10 mg Oral Daily  . famotidine  20 mg Oral BID  . hydrALAZINE  50 mg Oral TID  . levothyroxine  125 mcg Oral QAC breakfast  . pantoprazole  40 mg Oral Daily  . rosuvastatin  20 mg Oral Daily  . spironolactone  25 mg  Oral Daily  . temazepam  15 mg Oral QHS  . warfarin  10 mg Oral ONCE-1800  . Warfarin - Pharmacist Dosing Inpatient   Does not apply q1800    Infusions: . heparin 1,000 Units/hr (05/29/16 1500)    PRN Medications: acetaminophen, HYDROcodone-acetaminophen, metoCLOPramide **OR** metoCLOPramide (REGLAN) injection, ondansetron **OR** ondansetron (ZOFRAN) IV, phenol   Assessment:   1. 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 Chronic Anticoag 4. HTN 5. Hypothroidism 6. Carpal Tunnel Syndrome  Plan/Discussion:    S/p Carpal Tunnel Surgery am of 05/29/16.  INR 1.1. Now on heparin gtt overlapping with warfarin, may stop heparin/go home when INR > 1.8.    Ambulate in hall today.  I reviewed the LVAD parameters from today, and compared the results to the patient's prior recorded data.  No programming changes were made.  The LVAD is functioning within specified parameters.  The patient performs LVAD  self-test daily.  LVAD interrogation was negative for any significant power changes, alarms or PI events/speed drops.  LVAD equipment check completed and is in good working order.  Back-up equipment present.   LVAD education done on emergency procedures and precautions and reviewed exit site care.  Length of Stay: [place],  05/30/2016, 8:55 AM  VAD Team --- VAD ISSUES ONLY--- Pager [phone] (7am - 7am)  Advanced Heart Failure Team  Pager [phone] (M-F; 7a - 4p)  Please contact [facility] for night-coverage after hours (4p -7a ) and weekends on [email or web address]

## 2016-05-30 NOTE — Discharge Instructions (Signed)

## 2016-05-31 ENCOUNTER — Encounter (HOSPITAL_COMMUNITY): Payer: Self-pay | Admitting: Orthopedic Surgery

## 2016-05-31 LAB — BASIC METABOLIC PANEL
Anion gap: 7 (ref 5–15)
BUN: 14 mg/dL (ref 6–20)
CO2: 24 mmol/L (ref 22–32)
Calcium: 9.2 mg/dL (ref 8.9–10.3)
Chloride: 106 mmol/L (ref 101–111)
Creatinine, Ser: 1.32 mg/dL — ABNORMAL HIGH (ref 0.61–1.24)
GFR calc Af Amer: >60 mL/min (ref >60)
GFR calc non Af Amer: 52 mL/min — ABNORMAL LOW (ref >60)
Glucose, Bld: 133 mg/dL — ABNORMAL HIGH (ref 65–99)
Potassium: 4.2 mmol/L (ref 3.5–5.1)
Sodium: 137 mmol/L (ref 135–145)

## 2016-05-31 LAB — CBC
HCT: 44.8 % (ref 39.0–52.0)
Hemoglobin: 14.2 g/dL (ref 13.0–17.0)
MCH: 30.1 pg (ref 26.0–34.0)
MCHC: 31.7 g/dL (ref 30.0–36.0)
MCV: 95.1 fL (ref 78.0–100.0)
Platelets: 133 10*3/uL — ABNORMAL LOW (ref 150–400)
RBC: 4.71 MIL/uL (ref 4.22–5.81)
RDW: 14.4 % (ref 11.5–15.5)
WBC: 6.1 10*3/uL (ref 4.0–10.5)

## 2016-05-31 LAB — LACTATE DEHYDROGENASE: LDH: 162 U/L (ref 98–192)

## 2016-05-31 LAB — HEPARIN LEVEL (UNFRACTIONATED): Heparin Unfractionated: 0.46 IU/mL (ref 0.30–0.70)

## 2016-05-31 LAB — PROTIME-INR
INR: 1.38
Prothrombin Time: 17.1 seconds — ABNORMAL HIGH (ref 11.4–15.2)

## 2016-05-31 MED ORDER — WARFARIN SODIUM 10 MG PO TABS
10.0000 mg | ORAL_TABLET | Freq: Once | ORAL | Status: AC
Start: 2016-05-31 — End: 2016-05-31
  Administered 2016-05-31: 10 mg via ORAL
  Filled 2016-05-31: qty 1

## 2016-05-31 NOTE — Progress Notes (Signed)
Patient ID: Johnny Oneill, male   DOB: 1941/01/16, 75 y.o.   MRN: [id number] HeartMate 2 Rounding Note  Subjective:    Admitted 05/26/16 for heparin bridge prior to carpal tunnel surgery 05/29/16.  S/p Carpal Tunnel release 9/29, no surgical complications.   Pending INR this morning. Now on heparin gtt + warfarin.    LVAD INTERROGATION:  HeartMate II LVAD:  Flow 4.3 liters/min, speed 9200, power 5.1, PI 7.  No PI events/24 hrs  Objective:    Vital Signs:   Temp:  [97.5 F (36.4 C)-98 F (36.7 C)] 98 F (36.7 C) (09/30 2000) Pulse Rate:  [55] 55 (09/30 1200) Resp:  [16-18] 16 (10/01 0800) BP: (89-103)/(74-90) 98/82 (10/01 0800) SpO2:  [95 %-98 %] 98 % (10/01 0800) Weight:  [159 lb 2.8 oz (72.2 kg)] 159 lb 2.8 oz (72.2 kg) (10/01 0500) Last BM Date: 05/30/16   Mean arterial Pressure 80s  Intake/Output:   Intake/Output Summary (Last 24 hours) at 05/31/16 0813 Last data filed at 05/31/16 0800  Gross per 24 hour  Intake              830 ml  Output             1025 ml  Net             -195 ml     Physical Exam: General:  Elderly and Well appearing.  HEENT: Normal Neck: supple. JVP 6-7 cm. Carotids 2+ bilat; no bruits. No thyromegaly or nodule noted. . Cor: Mechanical heart sounds with LVAD hum present. Lungs: CTAB, normal effort Abdomen: soft, NT, ND, no HSM. No bruits or masses. +BS  Driveline: C/D/I; securement device intact and driveline incorporated Extremities: no cyanosis, clubbing, rash, edema left wrist/hand in bandage Neuro: alert & orientedx3, cranial nerves grossly intact. moves all 4 extremities w/o difficulty. Affect pleasant  Telemetry: Reviewed, NSR  Labs: Basic Metabolic Panel:  Recent Labs Lab 05/27/16 2013 05/28/16 1230 05/29/16 0319 05/30/16 0548  NA 139 147* 137 139  K 4.4 3.8 3.7 4.0  CL 108 95* 107 106  CO2 26 18* 22 26  GLUCOSE 122* 92 98 111*  BUN 22* 19 20 17   CREATININE 1.67* 1.25* 1.35* 1.40*  CALCIUM 9.2 7.0* 8.9 8.8*    Liver  Function Tests:  Recent Labs Lab 05/27/16 2013  AST 25  ALT 19  ALKPHOS 67  BILITOT 0.9  PROT 6.3*  ALBUMIN 3.9   No results for input(s): LIPASE, AMYLASE in the last 168 hours. No results for input(s): AMMONIA in the last 168 hours.  CBC:  Recent Labs Lab 05/27/16 2013 05/29/16 0319 05/30/16 0548  WBC 5.2 5.4 5.7  NEUTROABS 3.3  --   --   HGB 13.7 13.9 13.4  HCT 43.2 43.9 42.1  MCV 94.9 94.0 94.0  PLT 119* 120* 105*    INR:  Recent Labs Lab 05/27/16 2013 05/28/16 0455 05/29/16 0319 05/30/16 0548  INR 1.40 1.31 1.24 1.10    Other results:  EKG:   Imaging: No results found.   Medications:     Scheduled Medications: . aspirin EC  325 mg Oral Daily  . citalopram  10 mg Oral Daily  . docusate sodium  100 mg Oral BID  . ezetimibe  10 mg Oral Daily  . famotidine  20 mg Oral BID  . hydrALAZINE  50 mg Oral TID  . levothyroxine  125 mcg Oral QAC breakfast  . pantoprazole  40 mg Oral Daily  .  rosuvastatin  20 mg Oral Daily  . spironolactone  25 mg Oral Daily  . temazepam  15 mg Oral QHS  . Warfarin - Pharmacist Dosing Inpatient   Does not apply q1800    Infusions: . heparin 1,000 Units/hr (05/31/16 0800)    PRN Medications: acetaminophen, HYDROcodone-acetaminophen, metoCLOPramide **OR** metoCLOPramide (REGLAN) injection, ondansetron **OR** ondansetron (ZOFRAN) IV, phenol   Assessment:   1. Chronic systolic HF ICM, EF 12-75% 2. 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 Chronic Anticoag 4. HTN 5. Hypothroidism 6. Carpal Tunnel Syndrome  Plan/Discussion:    S/p Carpal Tunnel Surgery am of 05/29/16.  Right hand swelling starting to go down.   INow on heparin gtt overlapping with warfarin, may stop heparin/go home when INR > 1.8.  INR 1.1 yesterday, labs not yet drawn today.  Suspect home maybe Tuesday.   Ambulate in hall today.  I reviewed the LVAD parameters from today, and compared the results to the patient's prior recorded data.  No  programming changes were made.  The LVAD is functioning within specified parameters.  The patient performs LVAD self-test daily.  LVAD interrogation was negative for any significant power changes, alarms or PI events/speed drops.  LVAD equipment check completed and is in good working order.  Back-up equipment present.   LVAD education done on emergency procedures and precautions and reviewed exit site care.  Length of Stay: [place],  05/31/2016, 8:13 AM  VAD Team --- VAD ISSUES ONLY--- Pager [phone] (7am - 7am)  Advanced Heart Failure Team  Pager [phone] (M-F; 7a - 4p)  Please contact [facility] for night-coverage after hours (4p -7a ) and weekends on [email or web address]

## 2016-05-31 NOTE — Progress Notes (Addendum)
ANTICOAGULATION CONSULT NOTE - Follow Up Consult  Pharmacy Consult for Heparin and Coumadin Indication: LVAD  Allergies  Allergen Reactions  . Ace Inhibitors     Hypotension and elevated creatinine  . Diovan [Valsartan] Other (See Comments)    Hypotension at low dose, hyperkalemia  . Lipitor [Atorvastatin Calcium] Other (See Comments)    Muscle pain  . Norvasc [Amlodipine] Other (See Comments)    Put patient to sleep    Patient Measurements: Height: 5\' 7"  (170.2 cm) Weight: 159 lb 2.8 oz (72.2 kg) IBW/kg (Calculated) : 66.1  Vital Signs: Temp: 98.5 F (36.9 C) (10/01 0905) Temp Source: Oral (10/01 0905) BP: 98/82 (10/01 0800)  Labs:  Recent Labs  05/28/16 1230  05/29/16 0319 05/30/16 0548 05/31/16 0908  HGB  --   < > 13.9 13.4 14.2  HCT  --   --  43.9 42.1 44.8  PLT  --   --  120* 105* 133*  LABPROT  --   --  15.7* 14.3 17.1*  INR  --   --  1.24 1.10 1.38  HEPARINUNFRC 0.37  --  0.32 0.36 0.46  CREATININE 1.25*  --  1.35* 1.40*  --   < > = values in this interval not displayed.  Estimated Creatinine Clearance: 44.6 mL/min (by C-G formula based on SCr of 1.4 mg/dL (H)).  Assessment: 74yom s/p LVAD admitted for carpal tunnel surgery. Warfarin held on admit and he was bridged with IV heparin. He is now s/p surgery continuing warfarin with heparin bridge. His previous heparin levels were all therapeutic on 1000 units/hr. Heparin level remains therapeutic. INR 1.38. CBC stable.   Home dose: 7.5mg  daily except 10mg  on Sunday/Thursday  Goal of Therapy:  INR 2-3 Heparin level 0.3-0.7 units/ml Monitor platelets by anticoagulation protocol: Yes   Plan:  1) Continue heparin at 1000 units/hr 2) Warfarin 10mg  x 1 tonight 3) Daily heparin level, INR, CBC  Angela Burke, PharmD Pharmacy Resident Pager: [phone] 05/31/2016,10:48 AM

## 2016-06-01 DIAGNOSIS — G5600 Carpal tunnel syndrome, unspecified upper limb: Secondary | ICD-10-CM

## 2016-06-01 LAB — BASIC METABOLIC PANEL
Anion gap: 7 (ref 5–15)
BUN: 18 mg/dL (ref 6–20)
CO2: 28 mmol/L (ref 22–32)
Calcium: 9 mg/dL (ref 8.9–10.3)
Chloride: 103 mmol/L (ref 101–111)
Creatinine, Ser: 1.34 mg/dL — ABNORMAL HIGH (ref 0.61–1.24)
GFR calc Af Amer: 59 mL/min — ABNORMAL LOW (ref >60)
GFR calc non Af Amer: 51 mL/min — ABNORMAL LOW (ref >60)
Glucose, Bld: 116 mg/dL — ABNORMAL HIGH (ref 65–99)
Potassium: 3.8 mmol/L (ref 3.5–5.1)
Sodium: 138 mmol/L (ref 135–145)

## 2016-06-01 LAB — CBC
HCT: 43.6 % (ref 39.0–52.0)
Hemoglobin: 13.8 g/dL (ref 13.0–17.0)
MCH: 30.1 pg (ref 26.0–34.0)
MCHC: 31.7 g/dL (ref 30.0–36.0)
MCV: 95 fL (ref 78.0–100.0)
Platelets: 126 10*3/uL — ABNORMAL LOW (ref 150–400)
RBC: 4.59 MIL/uL (ref 4.22–5.81)
RDW: 14.6 % (ref 11.5–15.5)
WBC: 5.2 10*3/uL (ref 4.0–10.5)

## 2016-06-01 LAB — LACTATE DEHYDROGENASE: LDH: 164 U/L (ref 98–192)

## 2016-06-01 LAB — PROTIME-INR
INR: 1.52
Prothrombin Time: 18.5 seconds — ABNORMAL HIGH (ref 11.4–15.2)

## 2016-06-01 LAB — HEPARIN LEVEL (UNFRACTIONATED): Heparin Unfractionated: 0.29 IU/mL — ABNORMAL LOW (ref 0.30–0.70)

## 2016-06-01 MED ORDER — WARFARIN SODIUM 10 MG PO TABS
10.0000 mg | ORAL_TABLET | Freq: Once | ORAL | Status: AC
  Administered 2016-06-01: 10 mg via ORAL
  Filled 2016-06-01: qty 1

## 2016-06-01 NOTE — Progress Notes (Signed)
ANTICOAGULATION CONSULT NOTE - Follow Up Consult  Pharmacy Consult for Heparin and Coumadin Indication: LVAD  Allergies  Allergen Reactions  . Ace Inhibitors     Hypotension and elevated creatinine  . Diovan [Valsartan] Other (See Comments)    Hypotension at low dose, hyperkalemia  . Lipitor [Atorvastatin Calcium] Other (See Comments)    Muscle pain  . Norvasc [Amlodipine] Other (See Comments)    Put patient to sleep    Patient Measurements: Height: 5\' 7"  (170.2 cm) Weight: 159 lb 2.8 oz (72.2 kg) IBW/kg (Calculated) : 66.1  Vital Signs: Temp: 97.4 F (36.3 C) (10/02 0849) Temp Source: Oral (10/02 0849) BP: 104/77 (10/02 0849)  Labs:  Recent Labs  05/30/16 0548 05/31/16 0908 06/01/16 0341  HGB 13.4 14.2 13.8  HCT 42.1 44.8 43.6  PLT 105* 133* 126*  LABPROT 14.3 17.1* 18.5*  INR 1.10 1.38 1.52  HEPARINUNFRC 0.36 0.46 0.29*  CREATININE 1.40* 1.32* 1.34*    Estimated Creatinine Clearance: 46.6 mL/min (by C-G formula based on SCr of 1.34 mg/dL (H)).  Assessment: 74yom s/p LVAD admitted for carpal tunnel surgery. Warfarin held on admit and he was bridged with IV heparin. He is now s/p surgery and  warfarin restarted with heparin bridge.Heparin drip 1000 uts/hr Heparin level were therapeuti but dropped slightly today 0.29 - will increase slightly. INR 1.38. CBC stable.  INR trending up 1.5 after 3 doses warfarin 10mg  - will repeat to ensure INR cliose to 1.8 for DC in am  Home dose: 7.5mg  daily except 10mg  on Sunday/Thursday  Goal of Therapy:  INR 2-3 Heparin level 0.3-0.7 units/ml Monitor platelets by anticoagulation protocol: Yes   Plan:  1) Increase heparin at 1100 units/hr 2) Warfarin 10mg  x 1 tonight 3) Daily heparin level, INR, CBC  Bonnita Nasuti Pharm.D. CPP, BCPS Clinical Pharmacist [phone] 06/01/2016 11:47 AM

## 2016-06-01 NOTE — Progress Notes (Signed)
Patient verbalized that he did not want to awaken for his blood pressure check between midnight and 0600. Patient encouraged to call nurse if he wakes up throughout the night to use the urinal so the nurse can manually check his blood pressure.

## 2016-06-01 NOTE — Op Note (Signed)
NAME:  Johnny Oneill, COMINSKY NO.:  [id number]  MEDICAL RECORD NO.:  [id number]  LOCATION:                               FACILITY:  [facility]  PHYSICIAN:  Lockie Pares, M.D.    DATE OF BIRTH:  1941/02/09  DATE OF PROCEDURE:  05/29/2016 DATE OF DISCHARGE:                              OPERATIVE REPORT   PREOPERATIVE DIAGNOSIS:  Right carpal tunnel.  POSTOPERATIVE DIAGNOSIS:  Right carpal tunnel.  OPERATION:  Right carpal tunnel release.  SURGEON:  Lockie Pares, M.D.  ASSISTANT:  Chriss Czar, PA-C  TOURNIQUET TIME:  Approximately 20 minutes.  DESCRIPTION OF PROCEDURE:  After IV Xylocaine, inflation of tourniquet by Anesthesia due to the patient being on preoperative heparin, we inserted 7 to 8 mL of 0.5% Marcaine with epinephrine along the incision line.  Incision was made just ulnar to the thenar crease through the transverse carpal ligament.  We carefully protected and identified the median nerve, which was noted to be moderately compressed.  Dissection was carried out proximally to include the antebrachial fascia, the wrist, and distally to the level of the superficial arch.  Wound was irrigated, closed with interrupted nylon.  Lightly compressive sterile dressing, bulky dressing applied, taken to the recovery room in a stable condition.     Lockie Pares, M.D.   ______________________________ Lockie Pares, M.D.    WDC/MEDQ  D:  05/29/2016  T:  05/30/2016  Job:  [id number]

## 2016-06-01 NOTE — Progress Notes (Signed)
Patient ID: Johnny Oneill, male   DOB: 06-May-1941, 75 y.o.   MRN: [id number] HeartMate 2 Rounding Note  Subjective:    Admitted 05/26/16 for heparin bridge prior to carpal tunnel surgery 05/29/16.  S/p Carpal Tunnel release 9/29, no surgical complications.   Today INR 1.52.  Now on heparin gtt + warfarin.    Denies pain.   LVAD INTERROGATION:  HeartMate II LVAD:  Flow 3.7 liters/min, speed 9200, power 5, PI 6.4.  No PI events/24 hrs  Objective:    Vital Signs:   Temp:  [97.4 F (36.3 C)-98.4 F (36.9 C)] 97.4 F (36.3 C) (10/02 0849) Pulse Rate:  [68-69] 68 (10/01 2000) Resp:  [18-20] 20 (10/02 0400) BP: (100-107)/(77-90) 104/77 (10/02 0849) SpO2:  [96 %-98 %] 98 % (10/02 0849) Last BM Date: 05/30/16   Mean arterial Pressure 80s   Intake/Output:   Intake/Output Summary (Last 24 hours) at 06/01/16 0905 Last data filed at 06/01/16 0600  Gross per 24 hour  Intake              570 ml  Output             1025 ml  Net             -455 ml     Physical Exam: General:  Elderly and Well appearing.  HEENT: Normal Neck: supple. JVP 6-7 cm. Carotids 2+ bilat; no bruits. No thyromegaly or nodule noted. . Cor: Mechanical heart sounds with LVAD hum present. Lungs: CTAB, normal effort Abdomen: soft, NT, ND, no HSM. No bruits or masses. +BS  Driveline: C/D/I; securement device intact and driveline incorporated Extremities: no cyanosis, clubbing, rash, edema left wrist/hand in bandage Neuro: alert & orientedx3, cranial nerves grossly intact. moves all 4 extremities w/o difficulty. Affect pleasant  Telemetry: Reviewed, NSR  Labs: Basic Metabolic Panel:  Recent Labs Lab 05/28/16 1230 05/29/16 0319 05/30/16 0548 05/31/16 0908 06/01/16 0341  NA 147* 137 139 137 138  K 3.8 3.7 4.0 4.2 3.8  CL 95* 107 106 106 103  CO2 18* 22 26 24 28   GLUCOSE 92 98 111* 133* 116*  BUN 19 20 17 14 18   CREATININE 1.25* 1.35* 1.40* 1.32* 1.34*  CALCIUM 7.0* 8.9 8.8* 9.2 9.0    Liver  Function Tests:  Recent Labs Lab 05/27/16 2013  AST 25  ALT 19  ALKPHOS 67  BILITOT 0.9  PROT 6.3*  ALBUMIN 3.9   No results for input(s): LIPASE, AMYLASE in the last 168 hours. No results for input(s): AMMONIA in the last 168 hours.  CBC:  Recent Labs Lab 05/27/16 2013 05/29/16 0319 05/30/16 0548 05/31/16 0908 06/01/16 0341  WBC 5.2 5.4 5.7 6.1 5.2  NEUTROABS 3.3  --   --   --   --   HGB 13.7 13.9 13.4 14.2 13.8  HCT 43.2 43.9 42.1 44.8 43.6  MCV 94.9 94.0 94.0 95.1 95.0  PLT 119* 120* 105* 133* 126*    INR:  Recent Labs Lab 05/28/16 0455 05/29/16 0319 05/30/16 0548 05/31/16 0908 06/01/16 0341  INR 1.31 1.24 1.10 1.38 1.52    Other results:  EKG:   Imaging: No results found.   Medications:     Scheduled Medications: . aspirin EC  325 mg Oral Daily  . citalopram  10 mg Oral Daily  . docusate sodium  100 mg Oral BID  . ezetimibe  10 mg Oral Daily  . famotidine  20 mg Oral BID  . hydrALAZINE  50  mg Oral TID  . levothyroxine  125 mcg Oral QAC breakfast  . pantoprazole  40 mg Oral Daily  . rosuvastatin  20 mg Oral Daily  . spironolactone  25 mg Oral Daily  . temazepam  15 mg Oral QHS  . Warfarin - Pharmacist Dosing Inpatient   Does not apply q1800    Infusions: . heparin 1,000 Units/hr (05/31/16 2000)    PRN Medications: acetaminophen, HYDROcodone-acetaminophen, metoCLOPramide **OR** metoCLOPramide (REGLAN) injection, ondansetron **OR** ondansetron (ZOFRAN) IV, phenol   Assessment:   1. Chronic systolic HF ICM, EF 30-16% 2. 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 Chronic Anticoag 4. HTN 5. Hypothroidism 6. Carpal Tunnel Syndrome  Plan/Discussion:    S/p Carpal Tunnel Surgery am of 05/29/16.  Right hand swelling starting to go down.   On heparin drip. INR 1.52. Anticipate D/C tomorrow.  Ambulate in hall today.  I reviewed the LVAD parameters from today, and compared the results to the patient's prior recorded data.  No  programming changes were made.  The LVAD is functioning within specified parameters.  The patient performs LVAD self-test daily.  LVAD interrogation was negative for any significant power changes, alarms or PI events/speed drops.  LVAD equipment check completed and is in good working order.  Back-up equipment present.   LVAD education done on emergency procedures and precautions and reviewed exit site care.  Length of Stay: 5  Amy Clegg,  06/01/2016, 9:05 AM  VAD Team --- VAD ISSUES ONLY--- Pager [phone] (7am - 7am)  Advanced Heart Failure Team  Pager [phone] (M-F; 7a - 4p)  Please contact [facility] for night-coverage after hours (4p -7a ) and weekends on [email or web address]  Patient seen and examined with Darrick Grinder, NP. We discussed all aspects of the encounter. I agree with the assessment and plan as stated above.   Doing well. INR coming up. Tolerating heparin without bleeding. VAD parameters stable. Likely home in am.   Emiliano Welshans,MD 9:48 AM

## 2016-06-01 NOTE — Progress Notes (Signed)
CARDIAC REHAB PHASE I   PRE:  Rate/Rhythm: paced 75  BP:  Supine:   Sitting: 87 MAP  Standing:    SaO2: 96%RA  MODE:  Ambulation: 700 ft   POST:  Rate/Rhythm: 91  BP:  Supine:   Sitting: 86 MAP  Standing:    SaO2: 96%RA 1400-1435 Assisted pt switching to batteries. Pt walked 700 ft using IV pole. I carried back up bag. Pt tolerated well. No dizziness. Left on batteries at pt's request. Stated he is going to walk later with wife when she comes. Notified RN that he is left to batteries. To bed after walk.   Graylon Good, RN BSN  06/01/2016 2:31 PM

## 2016-06-01 NOTE — Progress Notes (Signed)
Admitted 05/27/16 due to heparin bridging for elective right carpal tunnel surgery.   HeartMate II LVAD implated on 09/18/13 by PVT for Destination Therapy.   Vital signs: HR: 68 NSR Doppler Pressure:  90 Automated blood pressure:  104/77 (87) O2 Sat: 98% Wt: 157 > 158 > > 155 > 159 lbs   LVAD interrogation reveals:  Speed: 9200 Flow: 4.7 Power: 5.1w PI: 6.2 Alarms: none Events:  Rare PI Fixed speed: 9200 Low speed limit: 8600  Primary Controller:  Replace back up battery in 23 months Back up controller:   Replace back up battery in 23 months  Drive Line: C/D/I. Changed day before coming in.   Labs:  LDH trend: 186 > 167 > 158 > 153 > 162 > 164  INR trend: 1.4 > 1.31 > 1.24 > 1.10 > 1.38 > 1.52  Anticoagulation Plan: -Home INR monitoring in place -Goal 2 - 3 -ASA Dose: 325 mg -Last dose Coumadin Sunday 05/24/16 prior to procedure; re-started 05/29/16 - Heparin gtt 9/27 - 05/31/16   Adverse Events on VAD: -none  Plan/Recommendations:  1. Monitor surgical site for bleeding.  2. Planned discharge tomorrow if INR 1.7 or greater  Zada Girt, RN [place] Coordinator  Office: [phone] 24/7 Emergency VAD Pager: [phone]

## 2016-06-01 NOTE — Progress Notes (Signed)
Subjective: 3 Days Post-Op Procedure(s) (LRB): CARPAL TUNNEL RELEASE (Right) Patient reports pain as mild.    Objective: Vital signs in last 24 hours: Temp:  [97.5 F (36.4 C)-98.5 F (36.9 C)] 97.5 F (36.4 C) (10/02 0400) Pulse Rate:  [68-69] 68 (10/01 2000) Resp:  [18-20] 20 (10/02 0400) BP: (100-107)/(81-90) 107/88 (10/01 2138) SpO2:  [96 %-98 %] 98 % (10/02 0400)  Intake/Output from previous day: 10/01 0701 - 10/02 0700 In: 710 [P.O.:480; I.V.:230] Out: 1025 [Urine:1025] Intake/Output this shift: No intake/output data recorded.   Recent Labs  05/30/16 0548 05/31/16 0908 06/01/16 0341  HGB 13.4 14.2 13.8    Recent Labs  05/31/16 0908 06/01/16 0341  WBC 6.1 5.2  RBC 4.71 4.59  HCT 44.8 43.6  PLT 133* 126*    Recent Labs  05/31/16 0908 06/01/16 0341  NA 137 138  K 4.2 3.8  CL 106 103  CO2 24 28  BUN 14 18  CREATININE 1.32* 1.34*  GLUCOSE 133* 116*  CALCIUM 9.2 9.0    Recent Labs  05/31/16 0908 06/01/16 0341  INR 1.38 1.52    Sensation intact distally Intact pulses distally Incision: dressing C/D/I  Assessment/Plan: 3 Days Post-Op Procedure(s) (LRB): CARPAL TUNNEL RELEASE (Right) NWB R hand dispo per vasc Elevate/ice operative hand  Chriss Czar 06/01/2016, 8:44 AM

## 2016-06-02 ENCOUNTER — Encounter (HOSPITAL_COMMUNITY): Payer: Self-pay | Admitting: *Deleted

## 2016-06-02 ENCOUNTER — Ambulatory Visit (HOSPITAL_COMMUNITY): Payer: Self-pay | Admitting: *Deleted

## 2016-06-02 LAB — CBC
HCT: 43.8 % (ref 39.0–52.0)
Hemoglobin: 14 g/dL (ref 13.0–17.0)
MCH: 30.5 pg (ref 26.0–34.0)
MCHC: 32 g/dL (ref 30.0–36.0)
MCV: 95.4 fL (ref 78.0–100.0)
Platelets: 133 10*3/uL — ABNORMAL LOW (ref 150–400)
RBC: 4.59 MIL/uL (ref 4.22–5.81)
RDW: 15 % (ref 11.5–15.5)
WBC: 5.2 10*3/uL (ref 4.0–10.5)

## 2016-06-02 LAB — PROTIME-INR
INR: 1.73
Prothrombin Time: 20.4 seconds — ABNORMAL HIGH (ref 11.4–15.2)

## 2016-06-02 LAB — HEPARIN LEVEL (UNFRACTIONATED): Heparin Unfractionated: 0.39 IU/mL (ref 0.30–0.70)

## 2016-06-02 LAB — LACTATE DEHYDROGENASE: LDH: 190 U/L (ref 98–192)

## 2016-06-02 MED ORDER — WARFARIN SODIUM 5 MG PO TABS: ORAL_TABLET | ORAL | 6 refills | Status: DC

## 2016-06-02 NOTE — Discharge Summary (Signed)
Advanced Heart Failure Team  Discharge Summary   Patient ID: HEARL HEIKES MRN: [id number], DOB/AGE: 1941/01/13 75 y.o. Admit date: 05/27/2016 D/C date:     06/02/2016   Primary Discharge Diagnoses:  1. S/P Carpal Tunnel Syndrome 05/29/2016  2. 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 Has home INR machine.  4. Chronic Anticoag 5. HTN 6. Hypothroidism  Hospital Course:  ELYAS VILLAMOR is a 75 y.o. 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   Admitted for  R carpal tunnel surgery. He required hospital admit due to LVAD and INR adjustment and heparin bridge following surgery. R carpal tunnel surgery completed on 0/09 absent complications. Minimal pain reported. Later started on heparin and once INR was 1.7, heparin was stopped. Discharged on slightly higher dose of coumadin. INR goal  2-3.   From HF perspective he remained stable. Continue current home regimen. He will follow up with Dr French Ana . He was instructed to check INR on Thursday with results sent to [place] [facility]. Follow up in the VAD clinic in 4 weeks.   Discharge Weight: 156 pounds  Discharge Vitals: Blood pressure 104/84, pulse 69, temperature 97.8 F (36.6 C), temperature source Oral, resp. rate 16, height 5\' 7"  (1.702 m), weight 156 lb 4.8 oz (70.9 kg), SpO2 94 %.  Labs: Lab Results  Component Value Date   WBC 5.2 06/02/2016   HGB 14.0 06/02/2016   HCT 43.8 06/02/2016   MCV 95.4 06/02/2016   PLT 133 (L) 06/02/2016    Recent Labs Lab 05/27/16 2013  06/01/16 0341  NA 139  < > 138  K 4.4  < > 3.8  CL 108  < > 103  CO2 26  < > 28  BUN 22*  < > 18  CREATININE 1.67*  < > 1.34*  CALCIUM 9.2  < > 9.0  PROT 6.3*  --   --   BILITOT 0.9  --   --   ALKPHOS 67  --   --   ALT 19  --   --   AST 25  --   --   GLUCOSE 122*  < > 116*  < > = values in this interval not  displayed. Lab Results  Component Value Date   CHOL 93 05/28/2015   HDL 41.50 05/28/2015   LDLCALC 29 05/28/2015   TRIG 112.0 05/28/2015   BNP (last 3 results)  Recent Labs  07/10/15 1038  BNP 346.0*    ProBNP (last 3 results) No results for input(s): PROBNP in the last 8760 hours.   Diagnostic Studies/Procedures   No results found.  Discharge Medications     Medication List    TAKE these medications   acetaminophen 500 MG chewable tablet Commonly known as:  TYLENOL Chew 500 mg by mouth every 6 (six) hours as needed for pain.   aspirin EC 325 MG tablet Take 325 mg by mouth daily.   citalopram 10 MG tablet Commonly known as:  CELEXA Take 1 tablet (10 mg total) by mouth daily.   CoQ10 200 MG Caps Take 200 mg by mouth daily.   ezetimibe 10 MG tablet Commonly known as:  ZETIA Take 1 tablet (10 mg total) by mouth daily.   furosemide 20 MG tablet Commonly known as:  LASIX Take 20 mg by mouth daily as needed (Take  20 mg as daily as needed for wt > 153 lbs). Reported on 01/14/2016   hydrALAZINE 25 MG tablet Commonly known as:  APRESOLINE Take 50 mg by mouth 3 (three) times daily.   levothyroxine 125 MCG tablet Commonly known as:  SYNTHROID, LEVOTHROID Take 1 tablet (125 mcg total) by mouth daily.   pantoprazole 40 MG tablet Commonly known as:  PROTONIX Take 1 tablet (40 mg total) by mouth daily.   potassium chloride SA 20 MEQ tablet Commonly known as:  K-DUR,KLOR-CON Take 1 tablet (20 mEq total) by mouth daily as needed. Take one potassium pill when you take your lasix.   PROBIOTIC PO Take 1 capsule by mouth daily.   ranitidine 150 MG tablet Commonly known as:  ZANTAC Take 150 mg by mouth daily as needed for heartburn.   rosuvastatin 20 MG tablet Commonly known as:  CRESTOR Take 1 tablet (20 mg total) by mouth daily.   spironolactone 25 MG tablet Commonly known as:  ALDACTONE Take 1 tablet (25 mg total) by mouth daily.   temazepam 15 MG  capsule Commonly known as:  RESTORIL Take 1 capsule (15 mg total) by mouth at bedtime.   warfarin 5 MG tablet Commonly known as:  COUMADIN Take 10 mg 10/3 then every Wed and Sun. On all other days take 7.5 mg. What changed:  additional instructions       Disposition   The patient will be discharged in stable condition to home. Discharge Instructions    Diet - low sodium heart healthy    Complete by:  As directed    Discharge wound care:    Complete by:  As directed    R hand dressing. Keep clean dry and intact   Increase activity slowly    Complete by:  As directed      Follow-up Information    CAFFREY JR,W D, MD. Schedule an appointment as soon as possible for a visit in 2 week(s).   Specialty:  Orthopedic Surgery Contact information: [place] [address] [phone]             Duration of Discharge Encounter: Greater than 35 minutes   Signed, Darrick Grinder NP-C  06/02/2016, 11:18 AM   Patient seen and examined with Darrick Grinder, NP. We discussed all aspects of the encounter. I agree with the assessment and plan as stated above.   He is doing well post-op. INR 1.7. Will stop heparin and let him go home. VAD parameters stable. Check INR on Thursday.   Azrael Huss,MD 11:34 AM

## 2016-06-02 NOTE — Progress Notes (Signed)
ANTICOAGULATION CONSULT NOTE - Follow Up Consult  Pharmacy Consult for Coumadin and Heparin Indication: LVAD  Patient Measurements: Height: 5\' 7"  (170.2 cm) Weight: 156 lb 4.8 oz (70.9 kg) IBW/kg (Calculated) : 66.1  Vital Signs: Temp: 97.8 F (36.6 C) (10/03 0745) Temp Source: Oral (10/03 0745) BP: 104/84 (10/03 0745) Pulse Rate: 69 (10/03 0745)  Labs:  Recent Labs  05/31/16 0908 06/01/16 0341 06/02/16 0208  HGB 14.2 13.8 14.0  HCT 44.8 43.6 43.8  PLT 133* 126* 133*  LABPROT 17.1* 18.5* 20.4*  INR 1.38 1.52 1.73  HEPARINUNFRC 0.46 0.29* 0.39  CREATININE 1.32* 1.34*  --     Estimated Creatinine Clearance: 46.6 mL/min (by C-G formula based on SCr of 1.34 mg/dL (H)).  Assessment: 74yom s/p LVAD admitted for carpal tunnel surgery. Warfarin held on admit and he was bridged with IV heparin. He is now s/p surgery and  warfarin restarted with heparin bridge. Heparin level therapeutic. INR 1.73 with nice trend up. CBC stable.  Home dose: 7.5mg  daily except 10mg  on Sunday/Wednesday  Goal of Therapy:  INR 2-3 Heparin level 0.3-0.7 units/ml Monitor platelets by anticoagulation protocol: Yes   Plan:  1) Continue heparin at 1100 units/hr 2) Warfarin 10mg  x 1 tonight, then resume home regimen - discussed this plan with patient - plans to go home today 3) Daily heparin level, INR, CBC   Melburn Popper, PharmD Clinical Pharmacy Resident Pager: [phone] 06/02/16 7:56 AM

## 2016-06-02 NOTE — Progress Notes (Signed)
Patient ID: Johnny Oneill, male   DOB: 28-Feb-1941, 75 y.o.   MRN: [id number] HeartMate 2 Rounding Note  Subjective:    Admitted 05/26/16 for heparin bridge prior to carpal tunnel surgery 05/29/16.  S/p Carpal Tunnel release 9/29, no surgical complications.   Today INR 1.73.  On heparin gtt + warfarin.    Denies pain. Wants to go home   LVAD INTERROGATION:  HeartMate II LVAD:  Flow 3.7 liters/min, speed 9200, power 5, PI 6.4.  No PI events/24 hrs  Objective:    Vital Signs:   Temp:  [97.4 F (36.3 C)-98.9 F (37.2 C)] 98.1 F (36.7 C) (10/03 0353) Pulse Rate:  [71-74] 74 (10/03 0353) Resp:  [16] 16 (10/02 2100) BP: (90-104)/(71-88) 99/71 (10/03 0353) SpO2:  [94 %-98 %] 94 % (10/03 0353) Weight:  [156 lb 4.8 oz (70.9 kg)] 156 lb 4.8 oz (70.9 kg) (10/03 0634) Last BM Date: 06/01/16   Mean arterial Pressure 80s   Intake/Output:   Intake/Output Summary (Last 24 hours) at 06/02/16 0745 Last data filed at 06/02/16 0700  Gross per 24 hour  Intake              809 ml  Output             2325 ml  Net            -1516 ml     Physical Exam: General:  Elderly and Well appearing. In bed HEENT: Normal Neck: supple. JVP flat -7 cm. Carotids 2+ bilat; no bruits. No thyromegaly or nodule noted. . Cor: Mechanical heart sounds with LVAD hum present. Lungs: CTAB, normal effort Abdomen: soft, NT, ND, no HSM. No bruits or masses. +BS  Driveline: C/D/I; securement device intact and driveline incorporated Extremities: no cyanosis, clubbing, rash, edema  Left wrist/hand in bandage Neuro: alert & orientedx3, cranial nerves grossly intact. moves all 4 extremities w/o difficulty. Affect pleasant  Telemetry: Reviewed, NSR  Labs: Basic Metabolic Panel:  Recent Labs Lab 05/28/16 1230 05/29/16 0319 05/30/16 0548 05/31/16 0908 06/01/16 0341  NA 147* 137 139 137 138  K 3.8 3.7 4.0 4.2 3.8  CL 95* 107 106 106 103  CO2 18* 22 26 24 28   GLUCOSE 92 98 111* 133* 116*  BUN 19 20 17 14 18    CREATININE 1.25* 1.35* 1.40* 1.32* 1.34*  CALCIUM 7.0* 8.9 8.8* 9.2 9.0    Liver Function Tests:  Recent Labs Lab 05/27/16 2013  AST 25  ALT 19  ALKPHOS 67  BILITOT 0.9  PROT 6.3*  ALBUMIN 3.9   No results for input(s): LIPASE, AMYLASE in the last 168 hours. No results for input(s): AMMONIA in the last 168 hours.  CBC:  Recent Labs Lab 05/27/16 2013 05/29/16 0319 05/30/16 0548 05/31/16 0908 06/01/16 0341 06/02/16 0208  WBC 5.2 5.4 5.7 6.1 5.2 5.2  NEUTROABS 3.3  --   --   --   --   --   HGB 13.7 13.9 13.4 14.2 13.8 14.0  HCT 43.2 43.9 42.1 44.8 43.6 43.8  MCV 94.9 94.0 94.0 95.1 95.0 95.4  PLT 119* 120* 105* 133* 126* 133*    INR:  Recent Labs Lab 05/29/16 0319 05/30/16 0548 05/31/16 0908 06/01/16 0341 06/02/16 0208  INR 1.24 1.10 1.38 1.52 1.73    Other results:  EKG:   Imaging: No results found.   Medications:     Scheduled Medications: . aspirin EC  325 mg Oral Daily  . citalopram  10 mg Oral  Daily  . docusate sodium  100 mg Oral BID  . ezetimibe  10 mg Oral Daily  . famotidine  20 mg Oral BID  . hydrALAZINE  50 mg Oral TID  . levothyroxine  125 mcg Oral QAC breakfast  . pantoprazole  40 mg Oral Daily  . rosuvastatin  20 mg Oral Daily  . spironolactone  25 mg Oral Daily  . temazepam  15 mg Oral QHS  . Warfarin - Pharmacist Dosing Inpatient   Does not apply q1800    Infusions: . heparin 1,100 Units/hr (06/01/16 1823)    PRN Medications: acetaminophen, HYDROcodone-acetaminophen, metoCLOPramide **OR** metoCLOPramide (REGLAN) injection, ondansetron **OR** ondansetron (ZOFRAN) IV, phenol   Assessment:   1. Chronic systolic HF ICM, EF 84-66% 2. 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 Chronic Anticoag 4. HTN 5. Hypothroidism 6. Carpal Tunnel Syndrome  Plan/Discussion:    S/p Carpal Tunnel Surgery am of 05/29/16.  Right hand swelling starting to go down.   INR 1.73. Pharmacy to dose. Coumadin prior to discharge. Plan for him  to check INR on Thursday . He has home INR machine.    I reviewed the LVAD parameters from today, and compared the results to the patient's prior recorded data.  No programming changes were made.  The LVAD is functioning within specified parameters.  The patient performs LVAD self-test daily.  LVAD interrogation was negative for any significant power changes, alarms or PI events/speed drops.  LVAD equipment check completed and is in good working order.  Back-up equipment present.   LVAD education done on emergency procedures and precautions and reviewed exit site care.  Length of Stay: [place], NP-C 06/02/2016, 7:45 AM  VAD Team --- VAD ISSUES ONLY--- Pager [phone] (7am - 7am)  Advanced Heart Failure Team  Pager [phone] (M-F; 7a - 4p)  Please contact [facility] for night-coverage after hours (4p -7a ) and weekends on [email or web address]  Patient seen and examined with Darrick Grinder, NP. We discussed all aspects of the encounter. I agree with the assessment and plan as stated above.   He is doing well. INR 1.7. [place] for d/c today. Check INR on Thursday.  Latoyia Tecson,MD 2:39 PM

## 2016-06-03 ENCOUNTER — Other Ambulatory Visit: Payer: Medicare Other

## 2016-06-04 ENCOUNTER — Ambulatory Visit (HOSPITAL_COMMUNITY): Payer: Self-pay | Admitting: *Deleted

## 2016-06-04 LAB — POCT INR: INR: 2

## 2016-06-06 ENCOUNTER — Other Ambulatory Visit (HOSPITAL_COMMUNITY): Payer: Self-pay | Admitting: Cardiology

## 2016-06-06 DIAGNOSIS — I5020 Unspecified systolic (congestive) heart failure: Secondary | ICD-10-CM

## 2016-06-06 DIAGNOSIS — E785 Hyperlipidemia, unspecified: Secondary | ICD-10-CM

## 2016-06-08 ENCOUNTER — Other Ambulatory Visit (HOSPITAL_COMMUNITY): Payer: Self-pay | Admitting: *Deleted

## 2016-06-08 ENCOUNTER — Ambulatory Visit: Payer: Medicare Other | Admitting: Endocrinology

## 2016-06-08 DIAGNOSIS — I5020 Unspecified systolic (congestive) heart failure: Secondary | ICD-10-CM

## 2016-06-08 DIAGNOSIS — I509 Heart failure, unspecified: Secondary | ICD-10-CM

## 2016-06-08 DIAGNOSIS — I255 Ischemic cardiomyopathy: Secondary | ICD-10-CM

## 2016-06-08 DIAGNOSIS — E785 Hyperlipidemia, unspecified: Secondary | ICD-10-CM

## 2016-06-08 MED ORDER — EZETIMIBE 10 MG PO TABS: 10.0000 mg | ORAL_TABLET | Freq: Every day | ORAL | 3 refills | Status: DC

## 2016-06-08 MED ORDER — ROSUVASTATIN CALCIUM 20 MG PO TABS: 20.0000 mg | ORAL_TABLET | Freq: Every day | ORAL | 3 refills | Status: DC

## 2016-06-11 ENCOUNTER — Other Ambulatory Visit (HOSPITAL_COMMUNITY): Payer: Self-pay | Admitting: *Deleted

## 2016-06-11 DIAGNOSIS — E785 Hyperlipidemia, unspecified: Secondary | ICD-10-CM

## 2016-06-11 DIAGNOSIS — Z7901 Long term (current) use of anticoagulants: Secondary | ICD-10-CM | POA: Diagnosis not present

## 2016-06-11 DIAGNOSIS — I5020 Unspecified systolic (congestive) heart failure: Secondary | ICD-10-CM

## 2016-06-11 DIAGNOSIS — G5601 Carpal tunnel syndrome, right upper limb: Secondary | ICD-10-CM | POA: Diagnosis not present

## 2016-06-11 LAB — POCT INR: INR: 2.1

## 2016-06-11 MED ORDER — ROSUVASTATIN CALCIUM 20 MG PO TABS: 20.0000 mg | ORAL_TABLET | Freq: Every day | ORAL | 3 refills | Status: DC

## 2016-06-12 ENCOUNTER — Ambulatory Visit (HOSPITAL_COMMUNITY): Payer: Self-pay | Admitting: Infectious Diseases

## 2016-06-12 ENCOUNTER — Ambulatory Visit (HOSPITAL_COMMUNITY)
Admission: RE | Admit: 2016-06-12 | Discharge: 2016-06-12 | Disposition: A | Discharge status: Home / Self Care | Payer: Medicare Other | Source: Ambulatory Visit | Attending: Cardiology | Admitting: Cardiology

## 2016-06-12 DIAGNOSIS — Z4801 Encounter for change or removal of surgical wound dressing: Secondary | ICD-10-CM | POA: Diagnosis not present

## 2016-06-12 DIAGNOSIS — Z95811 Presence of heart assist device: Secondary | ICD-10-CM | POA: Diagnosis not present

## 2016-06-12 DIAGNOSIS — I5022 Chronic systolic (congestive) heart failure: Secondary | ICD-10-CM | POA: Insufficient documentation

## 2016-06-12 NOTE — Progress Notes (Signed)
LVAD Exit Site:  VAD dressing removed and site care performed using sterile technique. Being done by [name] 2x/week with gauze kits d/t frequent rash formation with sweating over summer. Well healed and incorporated. The velour is fully implanted at exit site. Dressing dry and intact. No erythema or drainage. Small scab present around DL. Stabilization device present and accurately applied. Cleansed with sterile saline (NO CHG PREP d/t allergy). Aquacel Ag strip placed. Planning on trying weekly kit again soon when weather cools.   LVAD equipment check completed and is in good working order. Back-up equipment present. LVAD education done on emergency procedures and precautions and reviewed exit site care.   Provided 4 additional daily dressings and 5 anchors at the visit today.

## 2016-06-15 ENCOUNTER — Other Ambulatory Visit (HOSPITAL_COMMUNITY): Payer: Self-pay | Admitting: *Deleted

## 2016-06-15 DIAGNOSIS — E785 Hyperlipidemia, unspecified: Secondary | ICD-10-CM

## 2016-06-15 DIAGNOSIS — I5020 Unspecified systolic (congestive) heart failure: Secondary | ICD-10-CM

## 2016-06-15 MED ORDER — ROSUVASTATIN CALCIUM 20 MG PO TABS: 20.0000 mg | ORAL_TABLET | Freq: Every day | ORAL | 3 refills | Status: DC

## 2016-06-18 ENCOUNTER — Ambulatory Visit (HOSPITAL_COMMUNITY): Payer: Self-pay | Admitting: Pharmacist

## 2016-06-18 LAB — POCT INR: INR: 2.5

## 2016-06-25 ENCOUNTER — Ambulatory Visit (HOSPITAL_COMMUNITY): Payer: Self-pay

## 2016-06-25 LAB — POCT INR: INR: 2.1

## 2016-06-25 NOTE — Patient Instructions (Signed)
Patient informed of therapeutic INR result (2.1). Patient attests that he continues to follow coumadin regimen with medication and diet. Normal daily routine, nothing further to report. Advised to continue daily regimen and recheck INR next Thursday. Aware and agreeable to plan. No questions/concerns at this time.

## 2016-06-25 NOTE — Progress Notes (Signed)
Patient informed of therapeutic INR result (2.1). Patient attests that he continues to follow coumadin regimen with medication and diet. Normal daily routine, nothing further to report. Advised to continue daily regimen and recheck INR next Thursday. Aware and agreeable to plan. No questions/concerns at this time.

## 2016-07-01 ENCOUNTER — Ambulatory Visit (HOSPITAL_COMMUNITY)
Admission: RE | Admit: 2016-07-01 | Discharge: 2016-07-01 | Disposition: A | Discharge status: Home / Self Care | Payer: Medicare Other | Source: Ambulatory Visit | Attending: Cardiology | Admitting: Cardiology

## 2016-07-01 ENCOUNTER — Ambulatory Visit (HOSPITAL_COMMUNITY): Payer: Self-pay | Admitting: Pharmacist

## 2016-07-01 ENCOUNTER — Encounter (HOSPITAL_COMMUNITY): Payer: Self-pay | Admitting: Infectious Diseases

## 2016-07-01 VITALS — BP 100/0 | HR 40 | Resp 18 | Wt 164.2 lb

## 2016-07-01 DIAGNOSIS — I2510 Atherosclerotic heart disease of native coronary artery without angina pectoris: Secondary | ICD-10-CM | POA: Insufficient documentation

## 2016-07-01 DIAGNOSIS — R0602 Shortness of breath: Secondary | ICD-10-CM | POA: Insufficient documentation

## 2016-07-01 DIAGNOSIS — Z95810 Presence of automatic (implantable) cardiac defibrillator: Secondary | ICD-10-CM | POA: Insufficient documentation

## 2016-07-01 DIAGNOSIS — I110 Hypertensive heart disease with heart failure: Secondary | ICD-10-CM | POA: Diagnosis not present

## 2016-07-01 DIAGNOSIS — R001 Bradycardia, unspecified: Secondary | ICD-10-CM

## 2016-07-01 DIAGNOSIS — I517 Cardiomegaly: Secondary | ICD-10-CM | POA: Diagnosis not present

## 2016-07-01 DIAGNOSIS — Z95811 Presence of heart assist device: Secondary | ICD-10-CM | POA: Diagnosis not present

## 2016-07-01 DIAGNOSIS — E7800 Pure hypercholesterolemia, unspecified: Secondary | ICD-10-CM | POA: Diagnosis not present

## 2016-07-01 DIAGNOSIS — E039 Hypothyroidism, unspecified: Secondary | ICD-10-CM | POA: Diagnosis not present

## 2016-07-01 DIAGNOSIS — M1990 Unspecified osteoarthritis, unspecified site: Secondary | ICD-10-CM | POA: Diagnosis not present

## 2016-07-01 DIAGNOSIS — F419 Anxiety disorder, unspecified: Secondary | ICD-10-CM | POA: Insufficient documentation

## 2016-07-01 DIAGNOSIS — I4510 Unspecified right bundle-branch block: Secondary | ICD-10-CM | POA: Diagnosis not present

## 2016-07-01 DIAGNOSIS — I252 Old myocardial infarction: Secondary | ICD-10-CM | POA: Insufficient documentation

## 2016-07-01 DIAGNOSIS — I5022 Chronic systolic (congestive) heart failure: Secondary | ICD-10-CM | POA: Insufficient documentation

## 2016-07-01 DIAGNOSIS — I255 Ischemic cardiomyopathy: Secondary | ICD-10-CM | POA: Insufficient documentation

## 2016-07-01 DIAGNOSIS — Z7982 Long term (current) use of aspirin: Secondary | ICD-10-CM | POA: Diagnosis not present

## 2016-07-01 DIAGNOSIS — Z7901 Long term (current) use of anticoagulants: Secondary | ICD-10-CM | POA: Diagnosis not present

## 2016-07-01 DIAGNOSIS — I159 Secondary hypertension, unspecified: Secondary | ICD-10-CM | POA: Insufficient documentation

## 2016-07-01 LAB — CBC
HCT: 43.8 % (ref 39.0–52.0)
Hemoglobin: 14.4 g/dL (ref 13.0–17.0)
MCH: 30.4 pg (ref 26.0–34.0)
MCHC: 32.9 g/dL (ref 30.0–36.0)
MCV: 92.4 fL (ref 78.0–100.0)
Platelets: 144 10*3/uL — ABNORMAL LOW (ref 150–400)
RBC: 4.74 MIL/uL (ref 4.22–5.81)
RDW: 14.5 % (ref 11.5–15.5)
WBC: 6.2 10*3/uL (ref 4.0–10.5)

## 2016-07-01 LAB — BASIC METABOLIC PANEL
Anion gap: 8 (ref 5–15)
BUN: 20 mg/dL (ref 6–20)
CO2: 25 mmol/L (ref 22–32)
Calcium: 9.2 mg/dL (ref 8.9–10.3)
Chloride: 105 mmol/L (ref 101–111)
Creatinine, Ser: 1.34 mg/dL — ABNORMAL HIGH (ref 0.61–1.24)
GFR calc Af Amer: 59 mL/min — ABNORMAL LOW (ref >60)
GFR calc non Af Amer: 51 mL/min — ABNORMAL LOW (ref >60)
Glucose, Bld: 87 mg/dL (ref 65–99)
Potassium: 4.2 mmol/L (ref 3.5–5.1)
Sodium: 138 mmol/L (ref 135–145)

## 2016-07-01 LAB — LACTATE DEHYDROGENASE: LDH: 254 U/L — ABNORMAL HIGH (ref 98–192)

## 2016-07-01 LAB — PROTIME-INR
INR: 2.2
Prothrombin Time: 24.8 seconds — ABNORMAL HIGH (ref 11.4–15.2)

## 2016-07-01 NOTE — Patient Instructions (Addendum)
STOP your Zetia today - we will recheck your cholesterol in a few months to make sure your cholesterol is OK.   Next visit we will do your 3 year anniversary visit! Please bring your power module and battery charger to perform maintenance at this visit. Will be doing your walking test at this visit as well.

## 2016-07-01 NOTE — Progress Notes (Addendum)
Patient presents for 43month follow up in [facility] today. Reports no problems with VAD equipment or concerns with drive line.  Vital Signs:  Doppler Pressure: 100 Automatc BP: 118/57 HR:  40/s  SPO2: 96 %  Weight: 164.4 lb w/o eqt Last weight: 157.8 lb  VAD Indication: Destination Therapy - patient choice removed from list   VAD interrogation & Equipment Management (reviewed with Dr. Aundra Dubin): Speed: 9200 Flow: 3.8 occasionally --- Power: 4.9 w    PI: 6.7  Alarms: no clinical alarms  Events: 0-8 events daily  Fixed speed: 9200 Low speed limit: 8600  Primary Controller:  Replace back up battery in 22 months. Back up controller:   Replace back up battery in 22 months.  Annual Equipment Maintenance on UBC/PM was performed on January 2016. New 14V batteries new as of 2017.   Reviewed the LVAD parameters from to day with Dr. Aundra Dubin and compared the results to the patient's prior recorded data. LVAD interrogation was NEGATIVE for significant power changes, NEGATIVE for clinical alarms and STABLE for PI events/speed drops. No programming changes were made and pump is functioning within specified parameters. Pt is performing daily controller and system monitor self tests along with completing weekly and monthly maintenance for LVAD equipment.  LVAD equipment check completed and is in good working order. Back-up equipment present. Charged back up battery and performed self-test on equipment.   Exit Site Care: Drive line is being maintained weekly by wife [name]. Has recently gone back to [place] dressing since weather is cooler. Looks clean, dry and intact under dressing today. Drive line exit site well healed and incorporated. The velour is fully implanted at exit site. Dressing dry and intact. No erythema or drainage. Stabilization device present and accurately applied. Pt denies fever or chills. 4 weekly kits provided today.   Significant Events on VAD  Support: none  Device: Medtronic  Therapies: Off Last check: 03/2015 - ERI reached at this time  Optivol & EKG performed today d/t bradycaria & reviewed by Dr. Aundra Dubin.   BP & Labs:  MAP 82  obtained with auto cuff- Doppler is reflecting modified systolic  Hgb 36.6 - No S/S of bleeding. Specifically denies melena/BRBPR or nosebleeds.  LDH stable at 254 with established baseline of 160 - 260. Denies tea-colored urine. No power elevations noted on interrogation.   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   Admitted 3/4-3/25/15 with SOB and syncopal episode. Found to have SBO and NG placed and started on TPN. On 11/06/13 developed progressive SOB and hypotension and co-ox 51% and Hgb 6.7. Started on milrinone and transfused. Echo showed nl RV function. Patient had PEA arrest and was intubated and started on pressors. CXR revealed a R hemothorax and a R CT placed. He required VATs and evacuation of hemothorax and was then tansferred to CIR.   Follow up for Heart Failure: Continues to do well. Feels good. Very active. Denies dyspnea. No edema, orthopnea or PND. No problems with VAD. Taking lasix PRN. No bleeding problems.  Seen by Dr. Rayann Heman and device at [facility]. No plans to replace.  Successful carpal tunnel surgery in 9/17.  Echo 9/16 EF 20%, LVAD cannula ok moderate RV dilation with mild to moderately decreased RV systolic function.  VAD interrogation revealed See LVAD nurse's note above.   Past Medical History:  Diagnosis Date  . Anxiety   . Automatic implantable cardioverter-defibrillator in situ   . CAD (coronary artery disease)    S/P stenting of LCX in 2004;  s/p Lat MI 2009 with occlusion of the LCX - treated with Promus stenting. Has total  occlusion of the RCA.   [name] CHF (congestive heart failure) ([place])   . Hypercholesteremia   . Hypertension   . Hypothyroidism   . ICD (implantable cardiac defibrillator) in place    PROPHYLACTIC      medtronic  . Inferior MI ([place]) "? date"; 2009  . Ischemic cardiomyopathy    a. 10/09 Echo: Sev LV Dysfxn, inf/lat AK, Mod MR.  [name] LVAD (left ventricular assist device) present ([place])   . Osteoarthritis    a. s/p R TKR  . Pacemaker   . PVC (premature ventricular contraction)   . RBBB   . Shortness of breath     Current Outpatient Prescriptions  Medication Sig Dispense Refill  . acetaminophen (TYLENOL) 500 MG chewable tablet Chew 500 mg by mouth every 6 (six) hours as needed for pain.    [name] aspirin EC 325 MG tablet Take 325 mg by mouth daily.    . citalopram (CELEXA) 10 MG tablet Take 1 tablet (10 mg total) by mouth daily. 90 tablet 3  . Coenzyme Q10 (COQ10) 200 MG CAPS Take 200 mg by mouth daily.    . furosemide (LASIX) 20 MG tablet Take 20 mg by mouth daily as needed (Take 20 mg as daily as needed for wt > 153 lbs). Reported on 01/14/2016    . hydrALAZINE (APRESOLINE) 25 MG tablet Take 50 mg by mouth 3 (three) times daily.    [name] levothyroxine (SYNTHROID, LEVOTHROID) 125 MCG tablet Take 1 tablet (125 mcg total) by mouth daily. 90 tablet 3  . pantoprazole (PROTONIX) 40 MG tablet Take 1 tablet (40 mg total) by mouth daily. 90 tablet 3  . potassium chloride SA (K-DUR,KLOR-CON) 20 MEQ tablet Take 1 tablet (20 mEq total) by mouth daily as needed. Take one potassium pill when you take your lasix. 90 tablet 3  . Probiotic Product (PROBIOTIC PO) Take 1 capsule by mouth daily.    . ranitidine (ZANTAC) 150 MG tablet Take 150 mg by mouth daily as needed for heartburn.    . rosuvastatin (CRESTOR) 20 MG tablet Take 1 tablet (20 mg total) by mouth daily. 90 tablet 3  . spironolactone (ALDACTONE) 25 MG tablet Take 1 tablet (25 mg total) by mouth daily. 90 tablet 3  . temazepam (RESTORIL) 15 MG capsule Take 1  capsule (15 mg total) by mouth at bedtime. 30 capsule 0  . warfarin (COUMADIN) 5 MG tablet Take 10 mg 10/3 then every Wed and Sun. On all other days take 7.5 mg. 60 tablet 6   No current facility-administered medications for this encounter.    Facility-Administered Medications Ordered in Other Encounters  Medication Dose Route Frequency Provider Last Rate Last Dose  . etomidate (AMIDATE) injection    Anesthesia Intra-op Myna Bright, CRNA   20 mg at 02/08/14 0915  . rocuronium [facility]) injection    Anesthesia Intra-op Myna Bright, CRNA   50 mg at 02/08/14 0932  . succinylcholine (ANECTINE) injection    Anesthesia Intra-op Myna Bright, CRNA   100 mg at 02/08/14 0915    Ace inhibitors; Diovan [valsartan]; Lipitor [atorvastatin calcium]; and Norvasc [amlodipine]  REVIEW OF  SYSTEMS: All systems negative except as listed in HPI, PMH and Problem list.   Vitals:   07/01/16 0920 07/01/16 1005  BP: (!) 118/57 (!) 100/0  Pulse: (!) 40   Resp: 18   SpO2: 100%   Weight: 164 lb 4 oz (74.5 kg)     Vital signs: BP (!) 100/0   Pulse (!) 40   Resp 18   Wt 164 lb 4 oz (74.5 kg)   SpO2 100%   BMI 25.73 kg/m  MAP 82  Physical Exam: GENERAL: Well appearing, male who presents to clinic today in no acute distress; Wife present HEENT: normal  NECK: Supple, JVP flat. no bruits. Prominent CV waves  No lymphadenopathy or thyromegaly appreciated.    CARDIAC:  Mechanical heart sounds with LVAD hum present.  LUNGS:  Clear to auscultation bilaterally.  ABDOMEN:  Soft, round, nontender, positive bowel sounds x4. No distension. nontender LVAD exit site: well-healed and semi incorporated.  Dressing dry and intact. Stabilization device present and accurately applied.  Driveline dressing is being changed weekly per sterile technique. EXTREMITIES:  Warm and dry, no cyanosis, clubbing, rash.  No edema.  NEUROLOGIC:  Alert and oriented x 4.  Gait steady.  No aphasia.  No dysarthria.  Affect  pleasant.     ASSESSMENT AND PLAN:   1) Chronic systolic HF: ICM, EF 56-31% s/p LVAD HM II implant 08/2013 for DT.  NYHA I-II symptoms. Volume status stable.  - Continue Lasix prn - MAP ok.  Continue current regimen. He has not tolerated ACE-I or amlodipine in the past. - ICD at [facility]. No plans to replace. 2) LVAD: HMII 08/2013 for DT.  Doing well. LVAD parameters stable. 3) Chronic anticoagulation: INR goal 2.0-3.0.   No bleeding issues.   - Continue ASA 325 mg for LVAD + warfarin. 4) HTN: MAP 80s. Continue current meds.  5) Hypothyroidism: Per Dr Dwyane Dee. Continue synthroid 150 mcg. 6) Hyperlipidemia: he is on Crestor and Zetia.  Zetia is too expensive.  I told him that it would be ok to stop it. Check lipids/LFTs in 2 months.   Loralie Champagne  MD  07/04/2016

## 2016-07-03 ENCOUNTER — Other Ambulatory Visit (INDEPENDENT_AMBULATORY_CARE_PROVIDER_SITE_OTHER): Payer: Medicare Other

## 2016-07-03 DIAGNOSIS — E890 Postprocedural hypothyroidism: Secondary | ICD-10-CM

## 2016-07-03 LAB — TSH: TSH: 1.95 u[IU]/mL (ref 0.35–4.50)

## 2016-07-03 LAB — T4, FREE: Free T4: 1.11 ng/dL (ref 0.60–1.60)

## 2016-07-06 ENCOUNTER — Encounter: Payer: Self-pay | Admitting: Endocrinology

## 2016-07-06 ENCOUNTER — Ambulatory Visit (INDEPENDENT_AMBULATORY_CARE_PROVIDER_SITE_OTHER): Payer: Medicare Other | Admitting: Endocrinology

## 2016-07-06 VITALS — BP 120/70 | HR 80 | Ht 67.0 in | Wt 165.0 lb

## 2016-07-06 DIAGNOSIS — E890 Postprocedural hypothyroidism: Secondary | ICD-10-CM | POA: Diagnosis not present

## 2016-07-06 DIAGNOSIS — I255 Ischemic cardiomyopathy: Secondary | ICD-10-CM

## 2016-07-06 NOTE — Patient Instructions (Signed)
Try to see if same brand of generic  Unithroid is preferrable

## 2016-07-06 NOTE — Progress Notes (Signed)
Patient ID: Johnny Oneill, male   DOB: December 14, 1940, 75 y.o.   MRN: [id number]   Reason for Appointment:  Hypothyroidism, followup visit    History of Present Illness:   The hypothyroidism was first diagnosed after radioactive iodine treatment for [name] several years ago  Previously he had lost significant amount of weight with his worsening heart failure and subsequently this stabilized More recently his weight has improved somewhat and is stable.  Previously was taking 200 mcg daily since 3/15 when he was hospitalized  Subsequently he was on steady dose of 175 mcg since 11/2013 Subsequently the dose was reduced down to 150 g in 2016 He is not usually aware of his medication dosage because his wife is giving him his medication.  More recently he has been taking 120 g because of persistently low TSH levels on higher doses He does feel fairly well overall with no unusual fatigue, heat or cold intolerance  He gets his prescription from [facility] consistently, not clear what brand of generic he gets  Compliance with the medical regimen has been as prescribed with taking the tablet in the morning before breakfast.     Wt Readings from Last 3 Encounters:  07/06/16 165 lb (74.8 kg)  07/01/16 164 lb 4 oz (74.5 kg)  06/02/16 156 lb 4.8 oz (70.9 kg)    Lab Results  Component Value Date   TSH 1.95 07/03/2016   TSH 0.138 (L) 03/18/2016   TSH 0.14 (L) 11/06/2015   FREET4 1.11 07/03/2016   FREET4 1.14 (H) 03/18/2016   FREET4 1.64 (H) 11/06/2015        Medication List       Accurate as of 07/06/16  2:00 PM. Always use your most recent med list.          acetaminophen 500 MG chewable tablet Commonly known as:  TYLENOL Chew 500 mg by mouth every 6 (six) hours as needed for pain.   aspirin EC 325 MG tablet Take 325 mg by mouth daily.   citalopram 10 MG tablet Commonly known as:  CELEXA Take 1 tablet (10 mg total) by mouth daily.   CoQ10 200 MG  Caps Take 200 mg by mouth daily.   furosemide 20 MG tablet Commonly known as:  LASIX Take 20 mg by mouth daily as needed (Take 20 mg as daily as needed for wt > 153 lbs). Reported on 01/14/2016   hydrALAZINE 25 MG tablet Commonly known as:  APRESOLINE Take 50 mg by mouth 3 (three) times daily.   levothyroxine 125 MCG tablet Commonly known as:  SYNTHROID, LEVOTHROID Take 1 tablet (125 mcg total) by mouth daily.   pantoprazole 40 MG tablet Commonly known as:  PROTONIX Take 1 tablet (40 mg total) by mouth daily.   potassium chloride SA 20 MEQ tablet Commonly known as:  K-DUR,KLOR-CON Take 1 tablet (20 mEq total) by mouth daily as needed. Take one potassium pill when you take your lasix.   PROBIOTIC PO Take 1 capsule by mouth daily.   ranitidine 150 MG tablet Commonly known as:  ZANTAC Take 150 mg by mouth daily as needed for heartburn.   rosuvastatin 20 MG tablet Commonly known as:  CRESTOR Take 1 tablet (20 mg total) by mouth daily.   spironolactone 25 MG tablet Commonly known as:  ALDACTONE Take 1 tablet (25 mg total) by mouth daily.   temazepam 15 MG capsule Commonly known as:  RESTORIL Take 1 capsule (15 mg total) by mouth  at bedtime.   warfarin 5 MG tablet Commonly known as:  COUMADIN Take 10 mg 10/3 then every Wed and Sun. On all other days take 7.5 mg.        Past Medical History:  Diagnosis Date  . Anxiety   . Automatic implantable cardioverter-defibrillator in situ   . CAD (coronary artery disease)    S/P stenting of LCX in 2004;  s/p Lat MI 2009 with occlusion of the LCX - treated with Promus stenting. Has total occlusion of the RCA.   [name] CHF (congestive heart failure) ([place])   . Hypercholesteremia   . Hypertension   . Hypothyroidism   . ICD (implantable cardiac defibrillator) in place    PROPHYLACTIC      medtronic  . Inferior MI ([place]) "? date"; 2009  . Ischemic cardiomyopathy    a. 10/09 Echo: Sev LV Dysfxn, inf/lat AK, Mod MR.  [name] LVAD (left  ventricular assist device) present ([place])   . Osteoarthritis    a. s/p R TKR  . Pacemaker   . PVC (premature ventricular contraction)   . RBBB   . Shortness of breath     Past Surgical History:  Procedure Laterality Date  . CARPAL TUNNEL RELEASE Right 05/29/2016   Procedure: CARPAL TUNNEL RELEASE;  Surgeon: Earlie Server, MD;  Location: [facility];  Service: Orthopedics;  Laterality: Right;  . CORONARY ANGIOPLASTY WITH STENT PLACEMENT  2004  . CORONARY ANGIOPLASTY WITH STENT PLACEMENT  07/2008  . DEFIBRILLATOR  2009  . ESOPHAGOGASTRODUODENOSCOPY N/A 09/15/2013   Procedure: ESOPHAGOGASTRODUODENOSCOPY (EGD);  Surgeon: Ladene Artist, MD;  Location: [facility] ENDOSCOPY;  Service: Endoscopy;  Laterality: N/A;  bedside  . HEMATOMA EVACUATION Right 11/06/2013   Procedure: EVACUATION HEMATOMA;  Surgeon: Gaye Pollack, MD;  Location: [facility];  Service: Cardiothoracic;  Laterality: Right;  . INSERT / REPLACE / REMOVE PACEMAKER  2009  . INSERTION OF IMPLANTABLE LEFT VENTRICULAR ASSIST DEVICE N/A 09/18/2013   Procedure: INSERTION OF IMPLANTABLE LEFT VENTRICULAR ASSIST DEVICE;  Surgeon: Ivin Poot, MD;  Location: [facility];  Service: Open Heart Surgery;  Laterality: N/A;  CIRC ARREST  NITRIC OXIDE  . INTRA-AORTIC BALLOON PUMP INSERTION N/A 09/14/2013   Procedure: INTRA-AORTIC BALLOON PUMP INSERTION;  Surgeon: Jolaine Artist, MD;  Location: [facility] CATH LAB;  Service: Cardiovascular;  Laterality: N/A;  . INTRAOPERATIVE TRANSESOPHAGEAL ECHOCARDIOGRAM N/A 09/18/2013   Procedure: INTRAOPERATIVE TRANSESOPHAGEAL ECHOCARDIOGRAM;  Surgeon: Ivin Poot, MD;  Location: [facility];  Service: Open Heart Surgery;  Laterality: N/A;  . KNEE ARTHROSCOPY     bilaterally  . KNEE ARTHROSCOPY W/ PARTIAL MEDIAL MENISCECTOMY  12/2002   left  . LEFT VENTRICULAR ASSIST DEVICE    . RIGHT HEART CATHETERIZATION N/A 08/25/2013   Procedure: RIGHT HEART CATH;  Surgeon: Jolaine Artist, MD;  Location: [facility] CATH LAB;  Service: Cardiovascular;   Laterality: N/A;  . RIGHT HEART CATHETERIZATION N/A 09/13/2013   Procedure: RIGHT HEART CATH;  Surgeon: Jolaine Artist, MD;  Location: [facility] CATH LAB;  Service: Cardiovascular;  Laterality: N/A;  . RIGHT HEART CATHETERIZATION N/A 02/08/2014   Procedure: RIGHT HEART CATH;  Surgeon: Jolaine Artist, MD;  Location: [facility] CATH LAB;  Service: Cardiovascular;  Laterality: N/A;  . RIGHT HEART CATHETERIZATION N/A 02/12/2014   Procedure: RIGHT HEART CATH;  Surgeon: Jolaine Artist, MD;  Location: [facility] CATH LAB;  Service: Cardiovascular;  Laterality: N/A;  . TOTAL KNEE ARTHROPLASTY  11/27/11   left  . TOTAL KNEE ARTHROPLASTY  11/27/2011  Procedure: TOTAL KNEE ARTHROPLASTY;  Surgeon: Yvette Rack., MD;  Location: [facility];  Service: Orthopedics;  Laterality: Left;  left total knee arthroplasty  . VIDEO ASSISTED THORACOSCOPY Right 11/06/2013   Procedure: VIDEO ASSISTED THORACOSCOPY;  Surgeon: Gaye Pollack, MD;  Location: [facility] OR;  Service: Cardiothoracic;  Laterality: Right;    Family History  Problem Relation Age of Onset  . Heart failure Father   . Stroke Father   . Coronary artery disease Mother   . Heart disease Mother   . Hyperlipidemia Sister     Social History:  reports that he has never smoked. He has never used smokeless tobacco. He reports that he does not drink alcohol or use drugs.  Allergies:  Allergies  Allergen Reactions  . Ace Inhibitors     Hypotension and elevated creatinine  . Diovan [Valsartan] Other (See Comments)    Hypotension at low dose, hyperkalemia  . Lipitor [Atorvastatin Calcium] Other (See Comments)    Muscle pain  . Norvasc [Amlodipine] Other (See Comments)    Put patient to sleep   ROS:  Cardiomyopathy: He is using a ventricle assist device and anticoagulation  Appettite is normal, weight is stable  Wt Readings from Last 3 Encounters:  07/06/16 165 lb (74.8 kg)  07/01/16 164 lb 4 oz (74.5 kg)  06/02/16 156 lb 4.8 oz (70.9 kg)     Examination:   BP  120/70   Pulse 80   Ht 5\' 7"  (1.702 m)   Wt 165 lb (74.8 kg)   SpO2 96%   BMI 25.84 kg/m     He looks well Biceps reflexes appear normal    Assessment/Plan:   Post ablative Hypothyroidism:  He is subjectively doing well Now with reducing his dose to 125 g his TSH is back to normal He does not usually subjectively have symptoms in his thyroid is unusually high or low  We'll continue the same dose for now May consider brand name Synthroid if he has variability in his thyroid requirement Follow-up in 4 months  Denaly Gatling 07/06/2016, 2:00 PM

## 2016-07-09 ENCOUNTER — Ambulatory Visit (HOSPITAL_COMMUNITY): Payer: Self-pay | Admitting: Pharmacist

## 2016-07-09 DIAGNOSIS — Z7901 Long term (current) use of anticoagulants: Secondary | ICD-10-CM | POA: Diagnosis not present

## 2016-07-09 LAB — POCT INR: INR: 2.4

## 2016-07-16 ENCOUNTER — Ambulatory Visit (HOSPITAL_COMMUNITY): Payer: Self-pay | Admitting: Pharmacist

## 2016-07-16 DIAGNOSIS — M1711 Unilateral primary osteoarthritis, right knee: Secondary | ICD-10-CM | POA: Diagnosis not present

## 2016-07-16 DIAGNOSIS — G5601 Carpal tunnel syndrome, right upper limb: Secondary | ICD-10-CM | POA: Diagnosis not present

## 2016-07-16 LAB — POCT INR: INR: 2.7

## 2016-07-22 ENCOUNTER — Ambulatory Visit (HOSPITAL_COMMUNITY): Payer: Self-pay | Admitting: Pharmacist

## 2016-07-22 LAB — POCT INR: INR: 2.9

## 2016-07-30 ENCOUNTER — Ambulatory Visit (HOSPITAL_COMMUNITY): Payer: Self-pay | Admitting: Pharmacist

## 2016-07-30 ENCOUNTER — Telehealth (HOSPITAL_COMMUNITY): Payer: Self-pay | Admitting: *Deleted

## 2016-07-30 DIAGNOSIS — I5022 Chronic systolic (congestive) heart failure: Secondary | ICD-10-CM

## 2016-07-30 LAB — POCT INR: INR: 2.6

## 2016-07-30 NOTE — Telephone Encounter (Signed)
Pt called c/o leg cramps that occur during the night, he states he did take an extra 1/2 KCL tab and it seemed to helped.  Pt will come in for bmet and mag level tomorrow AM per Darrick Grinder, NP

## 2016-07-31 ENCOUNTER — Ambulatory Visit (HOSPITAL_COMMUNITY)
Admission: RE | Admit: 2016-07-31 | Discharge: 2016-07-31 | Disposition: A | Discharge status: Home / Self Care | Payer: Medicare Other | Source: Ambulatory Visit | Attending: Internal Medicine | Admitting: Internal Medicine

## 2016-07-31 DIAGNOSIS — I5022 Chronic systolic (congestive) heart failure: Secondary | ICD-10-CM | POA: Diagnosis not present

## 2016-07-31 LAB — BASIC METABOLIC PANEL
Anion gap: 6 (ref 5–15)
BUN: 24 mg/dL — ABNORMAL HIGH (ref 6–20)
CO2: 27 mmol/L (ref 22–32)
Calcium: 9.4 mg/dL (ref 8.9–10.3)
Chloride: 103 mmol/L (ref 101–111)
Creatinine, Ser: 1.51 mg/dL — ABNORMAL HIGH (ref 0.61–1.24)
GFR calc Af Amer: 51 mL/min — ABNORMAL LOW (ref >60)
GFR calc non Af Amer: 44 mL/min — ABNORMAL LOW (ref >60)
Glucose, Bld: 88 mg/dL (ref 65–99)
Potassium: 4.6 mmol/L (ref 3.5–5.1)
Sodium: 136 mmol/L (ref 135–145)

## 2016-07-31 LAB — MAGNESIUM: Magnesium: 2.2 mg/dL (ref 1.7–2.4)

## 2016-08-07 ENCOUNTER — Ambulatory Visit (HOSPITAL_COMMUNITY): Payer: Self-pay

## 2016-08-07 ENCOUNTER — Other Ambulatory Visit (HOSPITAL_COMMUNITY): Payer: Self-pay

## 2016-08-07 ENCOUNTER — Telehealth (HOSPITAL_COMMUNITY): Payer: Self-pay

## 2016-08-07 DIAGNOSIS — Z7901 Long term (current) use of anticoagulants: Secondary | ICD-10-CM | POA: Diagnosis not present

## 2016-08-07 LAB — POCT INR: INR: 2.4

## 2016-08-07 MED ORDER — TEMAZEPAM 15 MG PO CAPS: 15.0000 mg | ORAL_CAPSULE | Freq: Every day | ORAL | 0 refills | Status: DC

## 2016-08-07 NOTE — Telephone Encounter (Signed)
Refill on Restoril sent to preferred pharmacy as requested.  Renee Pain, RN

## 2016-08-07 NOTE — Progress Notes (Signed)
Spoke with aptient's wife. Wife reports morning INR today was 2.4 Therapeutic range, no reports of missed doses, change in diet/daily rouitine/regimen. Advised to continue daily routine and dose and recheck in 1 week.

## 2016-08-10 ENCOUNTER — Other Ambulatory Visit (HOSPITAL_COMMUNITY): Payer: Self-pay | Admitting: *Deleted

## 2016-08-10 MED ORDER — TEMAZEPAM 15 MG PO CAPS: 15.0000 mg | ORAL_CAPSULE | Freq: Every day | ORAL | 0 refills | Status: DC

## 2016-08-13 ENCOUNTER — Ambulatory Visit (HOSPITAL_COMMUNITY)
Admission: RE | Admit: 2016-08-13 | Discharge: 2016-08-13 | Disposition: A | Discharge status: Home / Self Care | Payer: Medicare Other | Source: Ambulatory Visit | Attending: Internal Medicine | Admitting: Internal Medicine

## 2016-08-13 ENCOUNTER — Telehealth (HOSPITAL_COMMUNITY): Payer: Self-pay | Admitting: *Deleted

## 2016-08-13 ENCOUNTER — Ambulatory Visit (HOSPITAL_COMMUNITY): Payer: Self-pay | Admitting: Pharmacist

## 2016-08-13 DIAGNOSIS — Z95811 Presence of heart assist device: Secondary | ICD-10-CM

## 2016-08-13 LAB — PROTIME-INR
INR: 2.15
Prothrombin Time: 24.3 seconds — ABNORMAL HIGH (ref 11.4–15.2)

## 2016-08-13 NOTE — Telephone Encounter (Signed)
Pt's wife called to report pt is out of test strips for his home INR check today, she request he come here for lab and she will call company to get new test stripes delivered.  Order placed, will come today for INR

## 2016-08-20 ENCOUNTER — Ambulatory Visit (HOSPITAL_COMMUNITY): Payer: Self-pay | Admitting: Pharmacist

## 2016-08-20 LAB — POCT INR: INR: 2.4

## 2016-08-22 ENCOUNTER — Other Ambulatory Visit (HOSPITAL_COMMUNITY): Payer: Self-pay | Admitting: Internal Medicine

## 2016-08-22 DIAGNOSIS — I10 Essential (primary) hypertension: Secondary | ICD-10-CM

## 2016-08-25 ENCOUNTER — Encounter (HOSPITAL_COMMUNITY): Payer: Self-pay | Admitting: Infectious Diseases

## 2016-08-27 ENCOUNTER — Ambulatory Visit (HOSPITAL_COMMUNITY): Payer: Self-pay | Admitting: Unknown Physician Specialty

## 2016-08-27 LAB — POCT INR: INR: 1.9

## 2016-08-28 ENCOUNTER — Telehealth: Payer: Self-pay | Admitting: Internal Medicine

## 2016-08-28 NOTE — Telephone Encounter (Signed)
New Message   Patient wife calling requesting call back from [place].

## 2016-09-02 ENCOUNTER — Encounter (HOSPITAL_COMMUNITY): Payer: Medicare Other

## 2016-09-03 ENCOUNTER — Ambulatory Visit (HOSPITAL_COMMUNITY): Payer: Self-pay | Admitting: Pharmacist

## 2016-09-03 LAB — POCT INR: INR: 2.3

## 2016-09-09 ENCOUNTER — Ambulatory Visit (HOSPITAL_COMMUNITY): Payer: Self-pay | Admitting: Pharmacist

## 2016-09-09 ENCOUNTER — Encounter (HOSPITAL_COMMUNITY): Payer: Self-pay

## 2016-09-09 ENCOUNTER — Ambulatory Visit (HOSPITAL_COMMUNITY)
Admission: RE | Admit: 2016-09-09 | Discharge: 2016-09-09 | Disposition: A | Discharge status: Home / Self Care | Payer: Medicare Other | Source: Ambulatory Visit | Attending: Cardiology | Admitting: Cardiology

## 2016-09-09 VITALS — BP 104/67 | HR 42 | Ht 69.0 in | Wt 165.1 lb

## 2016-09-09 DIAGNOSIS — Z95810 Presence of automatic (implantable) cardiac defibrillator: Secondary | ICD-10-CM | POA: Diagnosis not present

## 2016-09-09 DIAGNOSIS — F419 Anxiety disorder, unspecified: Secondary | ICD-10-CM | POA: Diagnosis not present

## 2016-09-09 DIAGNOSIS — I255 Ischemic cardiomyopathy: Secondary | ICD-10-CM | POA: Diagnosis not present

## 2016-09-09 DIAGNOSIS — I2510 Atherosclerotic heart disease of native coronary artery without angina pectoris: Secondary | ICD-10-CM | POA: Diagnosis not present

## 2016-09-09 DIAGNOSIS — Z95811 Presence of heart assist device: Secondary | ICD-10-CM | POA: Insufficient documentation

## 2016-09-09 DIAGNOSIS — Z7982 Long term (current) use of aspirin: Secondary | ICD-10-CM | POA: Diagnosis not present

## 2016-09-09 DIAGNOSIS — R001 Bradycardia, unspecified: Secondary | ICD-10-CM | POA: Insufficient documentation

## 2016-09-09 DIAGNOSIS — Z955 Presence of coronary angioplasty implant and graft: Secondary | ICD-10-CM | POA: Diagnosis not present

## 2016-09-09 DIAGNOSIS — Z7901 Long term (current) use of anticoagulants: Secondary | ICD-10-CM | POA: Insufficient documentation

## 2016-09-09 DIAGNOSIS — I4510 Unspecified right bundle-branch block: Secondary | ICD-10-CM | POA: Diagnosis not present

## 2016-09-09 DIAGNOSIS — E039 Hypothyroidism, unspecified: Secondary | ICD-10-CM | POA: Insufficient documentation

## 2016-09-09 DIAGNOSIS — I110 Hypertensive heart disease with heart failure: Secondary | ICD-10-CM | POA: Insufficient documentation

## 2016-09-09 DIAGNOSIS — I5022 Chronic systolic (congestive) heart failure: Secondary | ICD-10-CM | POA: Insufficient documentation

## 2016-09-09 DIAGNOSIS — R0602 Shortness of breath: Secondary | ICD-10-CM | POA: Diagnosis not present

## 2016-09-09 DIAGNOSIS — M1990 Unspecified osteoarthritis, unspecified site: Secondary | ICD-10-CM | POA: Insufficient documentation

## 2016-09-09 DIAGNOSIS — I252 Old myocardial infarction: Secondary | ICD-10-CM | POA: Diagnosis not present

## 2016-09-09 DIAGNOSIS — E7800 Pure hypercholesterolemia, unspecified: Secondary | ICD-10-CM | POA: Insufficient documentation

## 2016-09-09 LAB — COMPREHENSIVE METABOLIC PANEL
ALT: 19 U/L (ref 17–63)
AST: 29 U/L (ref 15–41)
Albumin: 3.9 g/dL (ref 3.5–5.0)
Alkaline Phosphatase: 60 U/L (ref 38–126)
Anion gap: 7 (ref 5–15)
BUN: 22 mg/dL — ABNORMAL HIGH (ref 6–20)
CO2: 26 mmol/L (ref 22–32)
Calcium: 9.1 mg/dL (ref 8.9–10.3)
Chloride: 107 mmol/L (ref 101–111)
Creatinine, Ser: 1.39 mg/dL — ABNORMAL HIGH (ref 0.61–1.24)
GFR calc Af Amer: 56 mL/min — ABNORMAL LOW (ref >60)
GFR calc non Af Amer: 49 mL/min — ABNORMAL LOW (ref >60)
Glucose, Bld: 115 mg/dL — ABNORMAL HIGH (ref 65–99)
Potassium: 4.4 mmol/L (ref 3.5–5.1)
Sodium: 140 mmol/L (ref 135–145)
Total Bilirubin: 0.9 mg/dL (ref 0.3–1.2)
Total Protein: 6.5 g/dL (ref 6.5–8.1)

## 2016-09-09 LAB — CBC
HCT: 44.3 % (ref 39.0–52.0)
Hemoglobin: 14.4 g/dL (ref 13.0–17.0)
MCH: 30.5 pg (ref 26.0–34.0)
MCHC: 32.5 g/dL (ref 30.0–36.0)
MCV: 93.9 fL (ref 78.0–100.0)
Platelets: 121 10*3/uL — ABNORMAL LOW (ref 150–400)
RBC: 4.72 MIL/uL (ref 4.22–5.81)
RDW: 15 % (ref 11.5–15.5)
WBC: 5.7 10*3/uL (ref 4.0–10.5)

## 2016-09-09 LAB — PROTIME-INR
INR: 2.08
Prothrombin Time: 23.8 seconds — ABNORMAL HIGH (ref 11.4–15.2)

## 2016-09-09 LAB — MAGNESIUM: Magnesium: 1.9 mg/dL (ref 1.7–2.4)

## 2016-09-09 LAB — LACTATE DEHYDROGENASE: LDH: 224 U/L — ABNORMAL HIGH (ref 98–192)

## 2016-09-09 LAB — BRAIN NATRIURETIC PEPTIDE: B Natriuretic Peptide: 202.2 pg/mL — ABNORMAL HIGH (ref 0.0–100.0)

## 2016-09-09 NOTE — Patient Instructions (Addendum)
1.  Will call you with lab results including INR results and coumadin dosing. 2.  F/U with Dr. Rayann Heman, EP on 09/17/16 at 11:15 for discussion of ICD change out.  3.  Return to [facility] clinic in two months.

## 2016-09-09 NOTE — Progress Notes (Signed)
Mr. Hardman comes to clinic for 2 month f/u visit with 3 year Intermacs follow up.  Symptom Yes No Details  Angina  x Activity:  Claudication  x How far:    Syncope  x When:  rarely with orthostatic changes  Stroke  x   Orthopnea  x How many pillows: 1  PND  x How often:  CPAP  n/a How many hrs:  Pedal edema  x   Abd fullness  x   N&V  x Good appetite; full meal   Diaphoresis  x When:  Bleeding  x   Urine  x Clear yellow   SOB  x Activity:  Palpitations  x When:   ICD shock  N/A Medtronic dual ICD EOL  Hospitlizaitons  x When/where/why:  ED visit  x When/where/why:   Other MD  x When/who/why:     Activity  x No limitations   Fluid  x  about 2 L   Diet  x  No limitaions   Doppler MAP: 89 Automatic BP: 104/67 (77) Weight: 165.2 lbs Last Weight: 157.8 lbs HR: 42 SPO2: 100%  HR 42 on montior; EKG obtained per Dr. Haroldine Laws with sinus brady and PVC's noted; no atrial pacing noted.   Intrinsic HR increased to 140 at completion of 6 min walk.   VAD interrogation revealed (reviewed with Dr. Haroldine Laws): Speed:  9200 Flow: 5.0   Power: 5.5w PI: 6.0 Alarms: none Events: 0 - 10 PI events every few days.  Fixed speed: 9200 Low speed limit: 8600 Primary Controller:  Replace back up battery in 20 months.  Back up controller:   Replace back up battery in 20 months.   I reviewed the LVAD parameters from today, and compared the results to the patient's prior recorded data.  No programming changes were made. The LVAD is functioning within specified parameters.  The patient performs LVAD self-test daily.  LVAD interrogation shows no change in PI events, power surges, or speed drops. LVAD equipment check completed and is in good working order. Back-up equipment not present.   Drive Line: Dressing changes are being performed weekly using weekly dressing kits and cleaning with SALINE WIPES (CHG allergy). Anchor in place and accurately applied. Four weekly dressing kits provided at visit  today.   Encounter Details: Reports to clinic for 2 month follow up. Feels great and has no complaints today. Denies specifically any dizziness, cheat pain, SOB, orthopnea or swelling. Has not needed any PRN diuretics in several months.    LDH 224 >> stable within established baseline 200 - 300. Denies tea-colored urine.   Creat1.39>> stable   Reviewed and demonstrated the following to patient and caregiver:               Reviewed the steps for replacing the running system controller with the back-up system controller (see patient handbook section 2 or the appropriate pamphlet).             X  Demonstrated (using the mock-driveline and controller) how to connect and disconnect the mock-driveline in back up system controller in a timely manner (less than 10 seconds) with return demonstration by patient and caregiver.              X  Reviewed system controller alarms and troubleshooting including hazard and advisory alarms and accessing alarm history. Re-enforced NOT TO attempt to perform any task displayed on the display screen alone or without calling the VAD pager [phone] (see patient handbook section 5 or  the appropriate pamphlet).                       X  Reviewed how to handle an emergency including when the pump is running and when the pump has stopped (see patient handbook section 8). Call 911 first and then the VAD pager at [phone].             X   Reviewed 14-volt lithium ion battery calibration steps (see patient handbook section 3).            X   Reviewed contents of black bag and what must be with patient at all times.            X  Reviewed driveline exit site including cleansing, dressing, and immobilizing with an anchor device to prevent exit site trauma.             X  Reviewed weekly maintenance which includes: Reviewing "Replacing the [place] with a [employer]" pamphlet. Clean batteries, clips, and battery charger contacts.   Check cables for damages. Rotate batteries; keep all 8 charged.              X  Reviewed monthly maintenance which includes: Reviewing Alarms and Troubleshooting. Check battery manufacturer dates. Check use/charge cycles for each battery; remember to re-calibrate every 70 uses when prompted.              X  Additional pamphlets Guide to Replacing the [place] with the [place] for Patients and Their Caregivers and HM II Alarms for Patients and Their Caregivers given to patient.              X   Batteries Manufacture Date: Number of uses: Re-calibration  4/17 < 70 Performed by patient as needed   Annual maintenance completed per [employer] on patient's home power module and Electrical engineer.    Backup system controller 11 volt battery charged during visit.   3 year Intermacs follow up completed including:  Quality of Life, KCCQ-12, and Neurocognitive trail making.   Pt completed 1650 feet during 6 minute walk with no symptoms.   Patient Instructions: 1.  Will call you with lab results including INR results and coumadin dosing. 2.  F/U with Dr. Rayann Heman, EP on 09/17/16 at 11:15 for discussion of ICD change out.  3.  Return to [facility] clinic in two months.  Due for annual echo / CXR next visit.   Janene Madeira, RN VAD Coordinator   Office: [phone] 24/7 Emergency VAD Pager: [phone]

## 2016-09-09 NOTE — Progress Notes (Signed)
EKG CRITICAL VALUE     12 lead EKG performed.  Critical value noted, Jilda Roche RN notified.   Yehuda Mao, CCT 09/09/2016 10:46 AM

## 2016-09-10 DIAGNOSIS — Z7901 Long term (current) use of anticoagulants: Secondary | ICD-10-CM | POA: Diagnosis not present

## 2016-09-11 LAB — POCT INR: INR: 2.1

## 2016-09-12 NOTE — Progress Notes (Signed)
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   Admitted 3/4-3/25/15 with SOB and syncopal episode. Found to have SBO and NG placed and started on TPN. On 11/06/13 developed progressive SOB and hypotension and co-ox 51% and Hgb 6.7. Started on milrinone and transfused. Echo showed nl RV function. Patient had PEA arrest and was intubated and started on pressors. CXR revealed a R hemothorax and a R CT placed. He required VATs and evacuation of hemothorax and was then tansferred to CIR.   Follow up for Heart Failure: Continues to do very well. Feels good. Very active. Denies dyspnea. No edema, orthopnea or PND. No problems with VAD. Has been having beeping from ICD at night and known to be ERI.  Taking lasix PRN. No bleeding or neuro problems.    Echo 9/16 EF 20%, LVAD cannula ok moderate RV dilation with mild to moderately decreased RV systolic function.   VAD interrogation revealed VAD interrogation revealed (reviewed with Dr. Haroldine Laws): Speed:  9200 Flow: 5.0                      Power: 5.5w PI: 6.0 Alarms: none Events: 0 - 10 PI events every few days.  Fixed speed: 9200 Low speed limit: 8600 Primary Controller:  Replace back up battery in 20 months.  Back up controller:   Replace back up battery in 20 months.   Past Medical History:  Diagnosis Date  . Anxiety   . Automatic implantable cardioverter-defibrillator in situ   . CAD (coronary artery disease)    S/P stenting of LCX in 2004;  s/p Lat MI 2009 with occlusion of the LCX - treated with Promus stenting. Has total occlusion of the RCA.   [name] CHF (congestive heart failure) ([place])   . Hypercholesteremia   . Hypertension   . Hypothyroidism   . ICD (implantable cardiac defibrillator) in place    PROPHYLACTIC      medtronic  . Inferior MI ([place]) "? date"; 2009  . Ischemic cardiomyopathy    a.  10/09 Echo: Sev LV Dysfxn, inf/lat AK, Mod MR.  [name] LVAD (left ventricular assist device) present ([place])   . Osteoarthritis    a. s/p R TKR  . Pacemaker   . PVC (premature ventricular contraction)   . RBBB   . Shortness of breath     Current Outpatient Prescriptions  Medication Sig Dispense Refill  . acetaminophen (TYLENOL) 500 MG chewable tablet Chew 500 mg by mouth every 6 (six) hours as needed for pain.    [name] aspirin EC 325 MG tablet Take 325 mg by mouth daily.    . citalopram (CELEXA) 10 MG tablet Take 1 tablet (10 mg total) by mouth daily. 90 tablet 3  . Coenzyme Q10 (COQ10) 200 MG CAPS Take 200 mg by mouth daily.    . hydrALAZINE (APRESOLINE) 25 MG tablet Take 2 tablets (50 mg total) by mouth 3 (three) times daily. 180 tablet 11  . levothyroxine (SYNTHROID, LEVOTHROID) 125 MCG tablet Take 1 tablet (125 mcg total) by mouth daily. 90 tablet 3  . pantoprazole (PROTONIX) 40 MG tablet Take 40 mg by mouth daily.    . potassium chloride SA (K-DUR,KLOR-CON) 20 MEQ tablet Take 1 tablet (20 mEq total) by mouth daily as needed. Take one potassium pill when you take your lasix. 90 tablet  3  . Probiotic Product (PROBIOTIC PO) Take 1 capsule by mouth daily.    . ranitidine (ZANTAC) 150 MG tablet Take 150 mg by mouth daily as needed for heartburn.    . rosuvastatin (CRESTOR) 20 MG tablet Take 1 tablet (20 mg total) by mouth daily. 90 tablet 3  . spironolactone (ALDACTONE) 25 MG tablet Take 1 tablet (25 mg total) by mouth daily. 90 tablet 3  . temazepam (RESTORIL) 15 MG capsule Take 1 capsule (15 mg total) by mouth at bedtime. 30 capsule 0  . warfarin (COUMADIN) 5 MG tablet Take 10 mg 10/3 then every Wed and Sun. On all other days take 7.5 mg. 60 tablet 6  . furosemide (LASIX) 20 MG tablet Take 20 mg by mouth daily as needed (Take 20 mg as daily as needed for wt > 153 lbs). Reported on 01/14/2016     No current facility-administered medications for this encounter.    Facility-Administered Medications  Ordered in Other Encounters  Medication Dose Route Frequency Provider Last Rate Last Dose  . etomidate (AMIDATE) injection    Anesthesia Intra-op Myna Bright, CRNA   20 mg at 02/08/14 0915  . rocuronium [facility]) injection    Anesthesia Intra-op Myna Bright, CRNA   50 mg at 02/08/14 0932  . succinylcholine (ANECTINE) injection    Anesthesia Intra-op Myna Bright, CRNA   100 mg at 02/08/14 0915    Ace inhibitors; Diovan [valsartan]; Lipitor [atorvastatin calcium]; and Norvasc [amlodipine]  REVIEW OF SYSTEMS: All systems negative except as listed in HPI, PMH and Problem list.   Vitals:   09/09/16 0959  BP: 104/67  BP Location: Left Arm  Patient Position: Sitting  Cuff Size: Normal  Pulse: (!) 42  SpO2: 100%  Weight: 165 lb 2 oz (74.9 kg)  Height: 5\' 9"  (1.753 m)    Vital signs: BP 104/67 (BP Location: Left Arm, Patient Position: Sitting, Cuff Size: Normal) Comment: 77 auto MAP, 89 doppler  Pulse (!) 42   Ht 5\' 9"  (1.753 m)   Wt 165 lb 2 oz (74.9 kg)   SpO2 100%   BMI 24.38 kg/m  MAP 82  Physical Exam: GENERAL: Well appearing, male who presents to clinic today in no acute distress HEENT: normal  NECK: Supple, JVP flat. no bruits. Prominent CV waves  No lymphadenopathy or thyromegaly appreciated.    CARDIAC:  Mechanical heart sounds with LVAD hum present.  LUNGS:  Clear to auscultation bilaterally.  ABDOMEN:  Soft, round, nontender, positive bowel sounds x4. No distension. nontender LVAD exit site: well-healed and semi incorporated.  Dressing dry and intact. Stabilization device present and accurately applied.  Driveline dressing is being changed weekly per sterile technique. EXTREMITIES:  Warm and dry, no cyanosis, clubbing, rash.  No edema. Palpable pulse in 40s NEUROLOGIC:  Alert and oriented x 4.  Gait steady.  No aphasia.  No dysarthria.  Affect pleasant.     ECG: Sinus in 70s with ventricular bigeminy   ASSESSMENT AND PLAN:   1) Chronic systolic  HF: ICM, EF 35-57% s/p LVAD HM II implant 08/2013 for DT.   - Doing extremely well. NYHA I-II symptoms. Volume status stable.  - Continue Lasix prn - MAP ok.  Continue current regimen. He has not tolerated ACE-I or amlodipine in the past. - ICD at [facility]. On ECG has ventricular bigeminy with effective HR in 40s. Have discussed with EP who will see him next week and will replace device. Bigeminy may resolve with overdrive  pacing. Can consider AA therapy as needed. 2) LVAD: HMII 08/2013 for DT.  Doing well. LVAD parameters stable. 3) Chronic anticoagulation: INR goal 2.0-3.0.   No bleeding issues.   - INR 2.08 today - Continue ASA 325 mg for LVAD + warfarin. 4) HTN:  --MAPs OK. Continue current meds.  5) Hypothyroidism:  ---Per Dr Dwyane Dee. Continue synthroid 150 mcg. 6) Hyperlipidemia:  -- he is on Crestor. Zetia stopped due to cost.  7) CAD --stable. No ischemic symptoms  Glori Bickers  MD  09/12/2016

## 2016-09-14 ENCOUNTER — Ambulatory Visit (HOSPITAL_COMMUNITY): Payer: Self-pay | Admitting: Pharmacist

## 2016-09-17 ENCOUNTER — Encounter: Payer: Medicare Other | Admitting: Internal Medicine

## 2016-09-17 LAB — POCT INR: INR: 1.9

## 2016-09-18 ENCOUNTER — Ambulatory Visit (HOSPITAL_COMMUNITY): Payer: Self-pay | Admitting: Pharmacist

## 2016-09-23 ENCOUNTER — Ambulatory Visit (INDEPENDENT_AMBULATORY_CARE_PROVIDER_SITE_OTHER): Payer: Medicare Other | Admitting: Internal Medicine

## 2016-09-23 ENCOUNTER — Encounter: Payer: Self-pay | Admitting: Internal Medicine

## 2016-09-23 VITALS — BP 92/80 | HR 42 | Ht 67.0 in | Wt 167.2 lb

## 2016-09-23 DIAGNOSIS — Z95810 Presence of automatic (implantable) cardiac defibrillator: Secondary | ICD-10-CM

## 2016-09-23 DIAGNOSIS — I2589 Other forms of chronic ischemic heart disease: Secondary | ICD-10-CM | POA: Diagnosis not present

## 2016-09-23 DIAGNOSIS — I255 Ischemic cardiomyopathy: Secondary | ICD-10-CM

## 2016-09-23 DIAGNOSIS — I5022 Chronic systolic (congestive) heart failure: Secondary | ICD-10-CM | POA: Diagnosis not present

## 2016-09-23 DIAGNOSIS — Z95811 Presence of heart assist device: Secondary | ICD-10-CM

## 2016-09-23 DIAGNOSIS — I493 Ventricular premature depolarization: Secondary | ICD-10-CM

## 2016-09-23 LAB — CUP PACEART INCLINIC DEVICE CHECK
Battery Voltage: 2.59 V
Brady Statistic AP VP Percent: 0.33 %
Brady Statistic AP VS Percent: 47.39 %
Brady Statistic AS VP Percent: 0 %
Brady Statistic AS VS Percent: 52.27 %
Brady Statistic RA Percent Paced: 40.18 %
Brady Statistic RV Percent Paced: 0.24 %
Date Time Interrogation Session: 20180124152034
HighPow Impedance: 40 Ohm
HighPow Impedance: 50 Ohm
Implantable Lead Implant Date: 20091103
Implantable Lead Implant Date: 20091103
Implantable Lead Location: 753859
Implantable Lead Location: 753860
Implantable Lead Model: 5076
Implantable Lead Model: 6947
Implantable Pulse Generator Implant Date: 20091103
Lead Channel Impedance Value: 384 Ohm
Lead Channel Impedance Value: 400 Ohm
Lead Channel Sensing Intrinsic Amplitude: 2.639 mV
Lead Channel Sensing Intrinsic Amplitude: 2.995 mV
Lead Channel Setting Pacing Amplitude: 2 V
Lead Channel Setting Pacing Amplitude: 2.5 V
Lead Channel Setting Pacing Pulse Width: 1.5 ms
Lead Channel Setting Sensing Sensitivity: 0.3 mV

## 2016-09-23 NOTE — Patient Instructions (Signed)
Medication Instructions: Your physician recommends that you continue on your current medications as directed. Please refer to the Current Medication list given to you today.   Labwork: None Ordered  Procedures/Testing: None Ordered  Follow-Up: Your physician recommends that you schedule a follow-up appointment as needed. Call [phone] to schedule if any problems arise and you wish to be see.    Any Additional Special Instructions Will Be Listed Below (If Applicable).     If you need a refill on your cardiac medications before your next appointment, please call your pharmacy.

## 2016-09-23 NOTE — Progress Notes (Signed)
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 male who presents today for electrophysiology followup.  Since last being seen in our clinic, the patient has done well.  He remains very active with his VAD.  He was seen recently and noted to have decreased HR 2/2 bigeminal PVC's and was referred back today to consider ICD generator change to allow for atrial overdrive pacing.  Device interrogation today demonstrates ICD at EOS but still with functioning pacing capability.  We are able to overdrive suppress PVC's if we atrially pace at 95bpm, but not lower than that.   Today, he denies symptoms of palpitations, chest pain, shortness of breath,  lower extremity edema, dizziness, presyncope, syncope, or ICD shocks.  The patient is otherwise without complaint today.   Past Medical History:  Diagnosis Date  . Anxiety   . Automatic implantable cardioverter-defibrillator in situ   . CAD (coronary artery disease)    S/P stenting of LCX in 2004;  s/p Lat MI 2009 with occlusion of the LCX - treated with Promus stenting. Has total occlusion of the RCA.   [name] CHF (congestive heart failure) ([place])   . Hypercholesteremia   . Hypertension   . Hypothyroidism   . ICD (implantable cardiac defibrillator) in place    PROPHYLACTIC      medtronic  . Inferior MI ([place]) "? date"; 2009  . Ischemic cardiomyopathy    a. 10/09 Echo: Sev LV Dysfxn, inf/lat AK, Mod MR.  [name] LVAD (left ventricular assist device) present ([place])   . Osteoarthritis    a. s/p R TKR  . Pacemaker   . PVC (premature ventricular contraction)   . RBBB   . Shortness of breath    Past Surgical History:  Procedure Laterality Date  . CARPAL TUNNEL RELEASE Right 05/29/2016   Procedure: CARPAL TUNNEL RELEASE;  Surgeon: Earlie Server, MD;  Location: [place];  Service: Orthopedics;  Laterality: Right;  . CORONARY ANGIOPLASTY WITH STENT PLACEMENT  2004  . CORONARY ANGIOPLASTY WITH STENT PLACEMENT  07/2008  .  DEFIBRILLATOR  2009  . ESOPHAGOGASTRODUODENOSCOPY N/A 09/15/2013   Procedure: ESOPHAGOGASTRODUODENOSCOPY (EGD);  Surgeon: Ladene Artist, MD;  Location: [facility] ENDOSCOPY;  Service: Endoscopy;  Laterality: N/A;  bedside  . HEMATOMA EVACUATION Right 11/06/2013   Procedure: EVACUATION HEMATOMA;  Surgeon: Gaye Pollack, MD;  Location: [place];  Service: Cardiothoracic;  Laterality: Right;  . INSERT / REPLACE / REMOVE PACEMAKER  2009  . INSERTION OF IMPLANTABLE LEFT VENTRICULAR ASSIST DEVICE N/A 09/18/2013   Procedure: INSERTION OF IMPLANTABLE LEFT VENTRICULAR ASSIST DEVICE;  Surgeon: Ivin Poot, MD;  Location: [place];  Service: Open Heart Surgery;  Laterality: N/A;  CIRC ARREST  NITRIC OXIDE  . INTRA-AORTIC BALLOON PUMP INSERTION N/A 09/14/2013   Procedure: INTRA-AORTIC BALLOON PUMP INSERTION;  Surgeon: Jolaine Artist, MD;  Location: [facility] CATH LAB;  Service: Cardiovascular;  Laterality: N/A;  . INTRAOPERATIVE TRANSESOPHAGEAL ECHOCARDIOGRAM N/A 09/18/2013   Procedure: INTRAOPERATIVE TRANSESOPHAGEAL ECHOCARDIOGRAM;  Surgeon: Ivin Poot, MD;  Location: [place];  Service: Open Heart Surgery;  Laterality: N/A;  . KNEE ARTHROSCOPY     bilaterally  . KNEE ARTHROSCOPY W/ PARTIAL MEDIAL MENISCECTOMY  12/2002   left  . LEFT VENTRICULAR ASSIST DEVICE    . RIGHT HEART CATHETERIZATION N/A 08/25/2013   Procedure: RIGHT HEART CATH;  Surgeon: Jolaine Artist, MD;  Location: [facility] C[facility] LAB;  Service: Cardiovascular;  Laterality: N/A;  . RIGHT HEART CATHETERIZATION N/A  09/13/2013   Procedure: RIGHT HEART CATH;  Surgeon: Jolaine Artist, MD;  Location: [facility] CATH LAB;  Service: Cardiovascular;  Laterality: N/A;  . RIGHT HEART CATHETERIZATION N/A 02/08/2014   Procedure: RIGHT HEART CATH;  Surgeon: Jolaine Artist, MD;  Location: [facility] CATH LAB;  Service: Cardiovascular;  Laterality: N/A;  . RIGHT HEART CATHETERIZATION N/A 02/12/2014   Procedure: RIGHT HEART CATH;  Surgeon: Jolaine Artist, MD;  Location: [facility] CATH  LAB;  Service: Cardiovascular;  Laterality: N/A;  . TOTAL KNEE ARTHROPLASTY  11/27/11   left  . TOTAL KNEE ARTHROPLASTY  11/27/2011   Procedure: TOTAL KNEE ARTHROPLASTY;  Surgeon: Yvette Rack., MD;  Location: [place];  Service: Orthopedics;  Laterality: Left;  left total knee arthroplasty  . VIDEO ASSISTED THORACOSCOPY Right 11/06/2013   Procedure: VIDEO ASSISTED THORACOSCOPY;  Surgeon: Gaye Pollack, MD;  Location: [facility] OR;  Service: Cardiothoracic;  Laterality: Right;    Current Outpatient Prescriptions  Medication Sig Dispense Refill  . acetaminophen (TYLENOL) 500 MG chewable tablet Chew 500 mg by mouth every 6 (six) hours as needed for pain.    [name] aspirin EC 325 MG tablet Take 325 mg by mouth daily.    . citalopram (CELEXA) 10 MG tablet Take 1 tablet (10 mg total) by mouth daily. 90 tablet 3  . Coenzyme Q10 (COQ10) 200 MG CAPS Take 200 mg by mouth daily.    . furosemide (LASIX) 20 MG tablet Take 20 mg by mouth daily as needed (Take 20 mg as daily as needed for wt > 153 lbs). Reported on 01/14/2016    . hydrALAZINE (APRESOLINE) 25 MG tablet Take 2 tablets (50 mg total) by mouth 3 (three) times daily. 180 tablet 11  . levothyroxine (SYNTHROID, LEVOTHROID) 125 MCG tablet Take 1 tablet (125 mcg total) by mouth daily. 90 tablet 3  . pantoprazole (PROTONIX) 40 MG tablet Take 40 mg by mouth daily.    . potassium chloride SA (K-DUR,KLOR-CON) 20 MEQ tablet Take 1 tablet (20 mEq total) by mouth daily as needed. Take one potassium pill when you take your lasix. 90 tablet 3  . Probiotic Product (PROBIOTIC PO) Take 1 capsule by mouth daily.    . ranitidine (ZANTAC) 150 MG tablet Take 150 mg by mouth daily as needed for heartburn.    . rosuvastatin (CRESTOR) 20 MG tablet Take 1 tablet (20 mg total) by mouth daily. 90 tablet 3  . spironolactone (ALDACTONE) 25 MG tablet Take 1 tablet (25 mg total) by mouth daily. 90 tablet 3  . temazepam (RESTORIL) 15 MG capsule Take 1 capsule (15 mg total) by mouth at  bedtime. 30 capsule 0  . warfarin (COUMADIN) 5 MG tablet Take 10 mg 10/3 then every Wed and Sun. On all other days take 7.5 mg. 60 tablet 6   No current facility-administered medications for this visit.    Facility-Administered Medications Ordered in Other Visits  Medication Dose Route Frequency Provider Last Rate Last Dose  . etomidate (AMIDATE) injection    Anesthesia Intra-op Myna Bright, CRNA   20 mg at 02/08/14 0915  . rocuronium [facility]) injection    Anesthesia Intra-op Myna Bright, CRNA   50 mg at 02/08/14 0932  . succinylcholine (ANECTINE) injection    Anesthesia Intra-op Myna Bright, CRNA   100 mg at 02/08/14 0915    Physical Exam: Vitals:   09/23/16 1409  BP: 92/80  Pulse: (!) 42  SpO2: 98%  Weight: 167 lb 3.2 oz (75.8  kg)  Height: 5\' 7"  (1.702 m)    GEN- The patient is well appearing, alert and oriented x 3 today.   Head- normocephalic, atraumatic Eyes-  Sclera clear, conjunctiva pink Ears- hearing intact Oropharynx- clear Lungs- Clear to ausculation bilaterally, normal work of breathing Chest- ICD pocket is well healed Heart- Regular rate and rhythm, VAD hum GI- soft, NT, ND, + BS Extremities- no clubbing, cyanosis, or edema  ICD interrogation- reviewed in detail today,  See PACEART report  Assessment and Plan:  1.  Chronic systolic dysfunction euvolemic today Stable on an appropriate medical regimen Followed closely in AHF clinic ICD at [facility] - as atrial pacing does not overdrive suppress PVC's, will not plan gen change at this time  2.  PVC's He continues to have bigeminal PVC's on device interrogation today We are able to overdrive suppress with an atrial rate of 95bpm Though we could consider amiodarone trial, I worry about the risks of this medicine.  As the patient is currently asymptomatic, I would prefer a more conservative approach.  The patient and his spouse are very clear that they would not wish to consider ablation.  Given his  fragility, I would agree with this approach.  Today, I have spent 25  minutes with the patient discussing his ICD status.  More than 50% of the visit time today was spent on this issue.  Follow-up with Dr Haroldine Laws as scheduled.  I will see as needed.  Thompson Grayer MD, [facility] 09/24/2016 8:52 PM

## 2016-09-24 ENCOUNTER — Ambulatory Visit (HOSPITAL_COMMUNITY): Payer: Self-pay | Admitting: Pharmacist

## 2016-09-24 LAB — POCT INR: INR: 2.4

## 2016-09-28 ENCOUNTER — Other Ambulatory Visit (HOSPITAL_COMMUNITY): Payer: Self-pay | Admitting: Cardiology

## 2016-09-28 DIAGNOSIS — I10 Essential (primary) hypertension: Secondary | ICD-10-CM

## 2016-09-28 MED ORDER — SPIRONOLACTONE 25 MG PO TABS: 25.0000 mg | ORAL_TABLET | Freq: Every day | ORAL | 3 refills | Status: DC

## 2016-09-28 NOTE — Telephone Encounter (Signed)
pts wife called to request refill on spiro

## 2016-09-29 ENCOUNTER — Other Ambulatory Visit (HOSPITAL_COMMUNITY): Payer: Self-pay | Admitting: *Deleted

## 2016-09-29 DIAGNOSIS — I10 Essential (primary) hypertension: Secondary | ICD-10-CM

## 2016-09-29 MED ORDER — SPIRONOLACTONE 25 MG PO TABS: 25.0000 mg | ORAL_TABLET | Freq: Every day | ORAL | 3 refills | Status: DC

## 2016-10-01 ENCOUNTER — Ambulatory Visit (HOSPITAL_COMMUNITY): Payer: Self-pay | Admitting: Pharmacist

## 2016-10-01 LAB — POCT INR: INR: 2.6

## 2016-10-02 ENCOUNTER — Other Ambulatory Visit: Payer: Medicare Other

## 2016-10-05 ENCOUNTER — Other Ambulatory Visit (HOSPITAL_COMMUNITY): Payer: Self-pay | Admitting: *Deleted

## 2016-10-05 DIAGNOSIS — I10 Essential (primary) hypertension: Secondary | ICD-10-CM

## 2016-10-05 MED ORDER — HYDRALAZINE HCL 50 MG PO TABS: 50.0000 mg | ORAL_TABLET | Freq: Three times a day (TID) | ORAL | 3 refills | Status: DC

## 2016-10-08 ENCOUNTER — Ambulatory Visit (HOSPITAL_COMMUNITY): Payer: Self-pay | Admitting: Pharmacist

## 2016-10-08 DIAGNOSIS — Z7901 Long term (current) use of anticoagulants: Secondary | ICD-10-CM | POA: Diagnosis not present

## 2016-10-08 LAB — POCT INR: INR: 2.4

## 2016-10-15 ENCOUNTER — Ambulatory Visit (HOSPITAL_COMMUNITY): Payer: Self-pay | Admitting: Pharmacist

## 2016-10-15 LAB — POCT INR: INR: 2.7

## 2016-10-22 ENCOUNTER — Ambulatory Visit: Payer: Self-pay | Admitting: Infectious Diseases

## 2016-10-22 LAB — POCT INR: INR: 2.2

## 2016-10-23 DIAGNOSIS — H900 Conductive hearing loss, bilateral: Secondary | ICD-10-CM | POA: Diagnosis not present

## 2016-10-23 DIAGNOSIS — H6123 Impacted cerumen, bilateral: Secondary | ICD-10-CM | POA: Diagnosis not present

## 2016-10-29 ENCOUNTER — Ambulatory Visit (HOSPITAL_COMMUNITY): Payer: Self-pay | Admitting: Pharmacist

## 2016-10-29 LAB — POCT INR: INR: 2.7

## 2016-10-30 ENCOUNTER — Other Ambulatory Visit (INDEPENDENT_AMBULATORY_CARE_PROVIDER_SITE_OTHER): Payer: Medicare Other

## 2016-10-30 DIAGNOSIS — E890 Postprocedural hypothyroidism: Secondary | ICD-10-CM

## 2016-10-30 LAB — T4, FREE: Free T4: 1.27 ng/dL (ref 0.60–1.60)

## 2016-10-30 LAB — TSH: TSH: 3.82 u[IU]/mL (ref 0.35–4.50)

## 2016-11-04 ENCOUNTER — Ambulatory Visit (INDEPENDENT_AMBULATORY_CARE_PROVIDER_SITE_OTHER): Payer: Medicare Other | Admitting: Endocrinology

## 2016-11-04 ENCOUNTER — Encounter: Payer: Self-pay | Admitting: Endocrinology

## 2016-11-04 VITALS — BP 92/68 | HR 42 | Ht 67.0 in | Wt 167.0 lb

## 2016-11-04 DIAGNOSIS — E890 Postprocedural hypothyroidism: Secondary | ICD-10-CM | POA: Diagnosis not present

## 2016-11-04 DIAGNOSIS — I255 Ischemic cardiomyopathy: Secondary | ICD-10-CM | POA: Diagnosis not present

## 2016-11-04 NOTE — Progress Notes (Signed)
Patient ID: Johnny Oneill, male   DOB: [birth year], 76 y.o.   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  Previously was taking 200 mcg daily since 3/15 when he was hospitalized  Subsequently he was on steady dose of 175 mcg since 11/2013 Subsequently the dose was reduced down to 150 g in 2016 He is not usually aware of his medication dosage because his wife is giving him his medication.   Current regimen: he has been taking 125 g because of persistently low TSH levels on higher doses He does feel fairly well overall with no unusual fatigue, heat or cold intolerance  He gets his prescription from [place] consistently, he does not know what brand of generic he gets  Compliance with the medical regimen has been as prescribed with taking the tablet in the morning before breakfast.    His TSH is again normal although trending higher  Wt Readings from Last 3 Encounters:  11/04/16 167 lb (75.8 kg)  09/23/16 167 lb 3.2 oz (75.8 kg)  09/09/16 165 lb 2 oz (74.9 kg)    Lab Results  Component Value Date   TSH 3.82 10/30/2016   TSH 1.95 07/03/2016   TSH 0.138 (L) 03/18/2016   FREET4 1.27 10/30/2016   FREET4 1.11 07/03/2016   FREET4 1.14 (H) 03/18/2016      Allergies as of 11/04/2016      Reactions   Ace Inhibitors    Hypotension and elevated creatinine   Diovan [valsartan] Other (See Comments)   Hypotension at low dose, hyperkalemia   Lipitor [atorvastatin Calcium] Other (See Comments)   Muscle pain   Norvasc [amlodipine] Other (See Comments)   Put patient to sleep      Medication List       Accurate as of  11/04/16  1:12 PM. Always use your most recent med list.          acetaminophen 500 MG chewable tablet Commonly known as:  TYLENOL Chew 500 mg by mouth every 6 (six) hours as needed for pain.   aspirin EC 325 MG tablet Take 325 mg by mouth daily.   citalopram 10 MG tablet Commonly known as:  CELEXA Take 1 tablet (10 mg total) by mouth daily.   CoQ10 200 MG Caps Take 200 mg by mouth daily.   furosemide 20 MG tablet Commonly known as:  LASIX Take 20 mg by mouth daily as needed (Take 20 mg as daily as needed for wt > 153 lbs). Reported on 01/14/2016   hydrALAZINE 50 MG tablet Commonly known as:  APRESOLINE Take 1 tablet (50 mg total) by mouth 3 (three) times daily.   levothyroxine 125 MCG tablet Commonly known as:  SYNTHROID, LEVOTHROID Take 1 tablet (125 mcg total) by mouth daily.   pantoprazole 40 MG tablet Commonly known as:  PROTONIX Take 40 mg by mouth daily.   potassium chloride SA 20 MEQ tablet Commonly known as:  K-DUR,KLOR-CON Take 1 tablet (20 mEq total) by mouth daily as needed. Take one potassium pill when you take your lasix.   PROBIOTIC PO Take 1 capsule by mouth daily.   ranitidine 150 MG tablet Commonly known  as:  ZANTAC Take 150 mg by mouth daily as needed for heartburn.   rosuvastatin 20 MG tablet Commonly known as:  CRESTOR Take 1 tablet (20 mg total) by mouth daily.   spironolactone 25 MG tablet Commonly known as:  ALDACTONE Take 1 tablet (25 mg total) by mouth daily.   temazepam 15 MG capsule Commonly known as:  RESTORIL Take 1 capsule (15 mg total) by mouth at bedtime.   warfarin 5 MG tablet Commonly known as:  COUMADIN Take 10 mg 10/3 then every Wed and Sun. On all other days take 7.5 mg.        Past Medical History:  Diagnosis Date  . Anxiety   . Automatic implantable cardioverter-defibrillator in situ   . CAD (coronary artery disease)    S/P stenting of LCX in 2004;  s/p Lat MI 2009 with occlusion of the LCX - treated with  Promus stenting. Has total occlusion of the RCA.   [name] CHF (congestive heart failure) ([place])   . Hypercholesteremia   . Hypertension   . Hypothyroidism   . ICD (implantable cardiac defibrillator) in place    PROPHYLACTIC      medtronic  . Inferior MI ([place]) "? date"; 2009  . Ischemic cardiomyopathy    a. 10/09 Echo: Sev LV Dysfxn, inf/lat AK, Mod MR.  [name] LVAD (left ventricular assist device) present ([place])   . Osteoarthritis    a. s/p R TKR  . Pacemaker   . PVC (premature ventricular contraction)   . RBBB   . Shortness of breath     Past Surgical History:  Procedure Laterality Date  . CARPAL TUNNEL RELEASE Right 05/29/2016   Procedure: CARPAL TUNNEL RELEASE;  Surgeon: Earlie Server, MD;  Location: [place];  Service: Orthopedics;  Laterality: Right;  . CORONARY ANGIOPLASTY WITH STENT PLACEMENT  2004  . CORONARY ANGIOPLASTY WITH STENT PLACEMENT  07/2008  . DEFIBRILLATOR  2009  . ESOPHAGOGASTRODUODENOSCOPY N/A 09/15/2013   Procedure: ESOPHAGOGASTRODUODENOSCOPY (EGD);  Surgeon: Ladene Artist, MD;  Location: [facility] ENDOSCOPY;  Service: Endoscopy;  Laterality: N/A;  bedside  . HEMATOMA EVACUATION Right 11/06/2013   Procedure: EVACUATION HEMATOMA;  Surgeon: Gaye Pollack, MD;  Location: [place];  Service: Cardiothoracic;  Laterality: Right;  . INSERT / REPLACE / REMOVE PACEMAKER  2009  . INSERTION OF IMPLANTABLE LEFT VENTRICULAR ASSIST DEVICE N/A 09/18/2013   Procedure: INSERTION OF IMPLANTABLE LEFT VENTRICULAR ASSIST DEVICE;  Surgeon: Ivin Poot, MD;  Location: [place];  Service: Open Heart Surgery;  Laterality: N/A;  CIRC ARREST  NITRIC OXIDE  . INTRA-AORTIC BALLOON PUMP INSERTION N/A 09/14/2013   Procedure: INTRA-AORTIC BALLOON PUMP INSERTION;  Surgeon: Jolaine Artist, MD;  Location: [facility] CATH LAB;  Service: Cardiovascular;  Laterality: N/A;  . INTRAOPERATIVE TRANSESOPHAGEAL ECHOCARDIOGRAM N/A 09/18/2013   Procedure: INTRAOPERATIVE TRANSESOPHAGEAL ECHOCARDIOGRAM;  Surgeon: Ivin Poot, MD;   Location: [place];  Service: Open Heart Surgery;  Laterality: N/A;  . KNEE ARTHROSCOPY     bilaterally  . KNEE ARTHROSCOPY W/ PARTIAL MEDIAL MENISCECTOMY  12/2002   left  . LEFT VENTRICULAR ASSIST DEVICE    . RIGHT HEART CATHETERIZATION N/A 08/25/2013   Procedure: RIGHT HEART CATH;  Surgeon: Jolaine Artist, MD;  Location: [facility] CATH LAB;  Service: Cardiovascular;  Laterality: N/A;  . RIGHT HEART CATHETERIZATION N/A 09/13/2013   Procedure: RIGHT HEART CATH;  Surgeon: Jolaine Artist, MD;  Location: [facility] CATH LAB;  Service: Cardiovascular;  Laterality: N/A;  . RIGHT HEART CATHETERIZATION N/A 02/08/2014  Procedure: RIGHT HEART CATH;  Surgeon: Jolaine Artist, MD;  Location: [facility] CATH LAB;  Service: Cardiovascular;  Laterality: N/A;  . RIGHT HEART CATHETERIZATION N/A 02/12/2014   Procedure: RIGHT HEART CATH;  Surgeon: Jolaine Artist, MD;  Location: [facility] CATH LAB;  Service: Cardiovascular;  Laterality: N/A;  . TOTAL KNEE ARTHROPLASTY  11/27/11   left  . TOTAL KNEE ARTHROPLASTY  11/27/2011   Procedure: TOTAL KNEE ARTHROPLASTY;  Surgeon: Yvette Rack., MD;  Location: [place];  Service: Orthopedics;  Laterality: Left;  left total knee arthroplasty  . VIDEO ASSISTED THORACOSCOPY Right 11/06/2013   Procedure: VIDEO ASSISTED THORACOSCOPY;  Surgeon: Gaye Pollack, MD;  Location: [facility] OR;  Service: Cardiothoracic;  Laterality: Right;    Family History  Problem Relation Age of Onset  . Heart failure Father   . Stroke Father   . Coronary artery disease Mother   . Heart disease Mother   . Hyperlipidemia Sister     Social History:  reports that he has never smoked. He has never used smokeless tobacco. He reports that he does not drink alcohol or use drugs.  Allergies:  Allergies  Allergen Reactions  . Ace Inhibitors     Hypotension and elevated creatinine  . Diovan [Valsartan] Other (See Comments)    Hypotension at low dose, hyperkalemia  . Lipitor [Atorvastatin Calcium] Other (See Comments)     Muscle pain  . Norvasc [Amlodipine] Other (See Comments)    Put patient to sleep   ROS:  Cardiomyopathy: He is using a ventricle assist device and anticoagulation  Appettite is normal, weight is stable  Wt Readings from Last 3 Encounters:  11/04/16 167 lb (75.8 kg)  09/23/16 167 lb 3.2 oz (75.8 kg)  09/09/16 165 lb 2 oz (74.9 kg)      Examination:   BP 92/68   Pulse (!) 42   Ht 5\' 7"  (1.702 m)   Wt 167 lb (75.8 kg)   SpO2 97%   BMI 26.16 kg/m     He looks well    Assessment/Plan:   Post ablative Hypothyroidism:  He is subjectively doing well Now with a consistent dose of 125 g levothyroxine his TSH is normal However TSH is trending higher, was low in 7/17  He does not usually subjectively have symptoms related to thyroid dysfunction   We'll continue the same dose for now but had a half a tablet once a week  He will follow-up in 6 months  Also reminded him to discuss with PCP about booster for Pneumovax  Imojean Yoshino 11/04/2016, 1:12 PM

## 2016-11-04 NOTE — Patient Instructions (Signed)
On Sundays take  1 1/2 pills of thyroid

## 2016-11-05 ENCOUNTER — Ambulatory Visit (HOSPITAL_COMMUNITY): Payer: Self-pay | Admitting: Pharmacist

## 2016-11-05 DIAGNOSIS — Z7901 Long term (current) use of anticoagulants: Secondary | ICD-10-CM | POA: Diagnosis not present

## 2016-11-05 LAB — POCT INR: INR: 2.3

## 2016-11-10 ENCOUNTER — Other Ambulatory Visit (HOSPITAL_COMMUNITY): Payer: Self-pay | Admitting: *Deleted

## 2016-11-10 DIAGNOSIS — Z95811 Presence of heart assist device: Secondary | ICD-10-CM

## 2016-11-11 ENCOUNTER — Ambulatory Visit (HOSPITAL_COMMUNITY): Payer: Self-pay | Admitting: Pharmacist

## 2016-11-11 ENCOUNTER — Ambulatory Visit (HOSPITAL_BASED_OUTPATIENT_CLINIC_OR_DEPARTMENT_OTHER)
Admission: RE | Admit: 2016-11-11 | Discharge: 2016-11-11 | Disposition: A | Discharge status: Home / Self Care | Payer: Medicare Other | Source: Ambulatory Visit

## 2016-11-11 ENCOUNTER — Ambulatory Visit (HOSPITAL_COMMUNITY)
Admission: RE | Admit: 2016-11-11 | Discharge: 2016-11-11 | Disposition: A | Discharge status: Home / Self Care | Payer: Medicare Other | Source: Ambulatory Visit | Attending: Internal Medicine | Admitting: Internal Medicine

## 2016-11-11 VITALS — BP 114/72 | HR 50 | Ht 69.0 in | Wt 166.6 lb

## 2016-11-11 DIAGNOSIS — I429 Cardiomyopathy, unspecified: Secondary | ICD-10-CM | POA: Diagnosis not present

## 2016-11-11 DIAGNOSIS — Z95811 Presence of heart assist device: Secondary | ICD-10-CM | POA: Diagnosis not present

## 2016-11-11 DIAGNOSIS — I071 Rheumatic tricuspid insufficiency: Secondary | ICD-10-CM | POA: Diagnosis not present

## 2016-11-11 DIAGNOSIS — I10 Essential (primary) hypertension: Secondary | ICD-10-CM

## 2016-11-11 DIAGNOSIS — I5022 Chronic systolic (congestive) heart failure: Secondary | ICD-10-CM

## 2016-11-11 DIAGNOSIS — Z9889 Other specified postprocedural states: Secondary | ICD-10-CM | POA: Insufficient documentation

## 2016-11-11 DIAGNOSIS — I252 Old myocardial infarction: Secondary | ICD-10-CM | POA: Insufficient documentation

## 2016-11-11 DIAGNOSIS — Z4889 Encounter for other specified surgical aftercare: Secondary | ICD-10-CM | POA: Diagnosis not present

## 2016-11-11 DIAGNOSIS — I700 Atherosclerosis of aorta: Secondary | ICD-10-CM | POA: Diagnosis not present

## 2016-11-11 DIAGNOSIS — I2510 Atherosclerotic heart disease of native coronary artery without angina pectoris: Secondary | ICD-10-CM | POA: Diagnosis not present

## 2016-11-11 DIAGNOSIS — I493 Ventricular premature depolarization: Secondary | ICD-10-CM | POA: Diagnosis not present

## 2016-11-11 DIAGNOSIS — Z7901 Long term (current) use of anticoagulants: Secondary | ICD-10-CM | POA: Diagnosis not present

## 2016-11-11 DIAGNOSIS — Z0000 Encounter for general adult medical examination without abnormal findings: Secondary | ICD-10-CM | POA: Diagnosis not present

## 2016-11-11 DIAGNOSIS — I110 Hypertensive heart disease with heart failure: Secondary | ICD-10-CM | POA: Insufficient documentation

## 2016-11-11 DIAGNOSIS — I351 Nonrheumatic aortic (valve) insufficiency: Secondary | ICD-10-CM | POA: Diagnosis not present

## 2016-11-11 LAB — BASIC METABOLIC PANEL
Anion gap: 7 (ref 5–15)
BUN: 19 mg/dL (ref 6–20)
CO2: 26 mmol/L (ref 22–32)
Calcium: 9.1 mg/dL (ref 8.9–10.3)
Chloride: 104 mmol/L (ref 101–111)
Creatinine, Ser: 1.35 mg/dL — ABNORMAL HIGH (ref 0.61–1.24)
GFR calc Af Amer: 59 mL/min — ABNORMAL LOW (ref >60)
GFR calc non Af Amer: 50 mL/min — ABNORMAL LOW (ref >60)
Glucose, Bld: 76 mg/dL (ref 65–99)
Potassium: 4.3 mmol/L (ref 3.5–5.1)
Sodium: 137 mmol/L (ref 135–145)

## 2016-11-11 LAB — CBC
HCT: 45.3 % (ref 39.0–52.0)
Hemoglobin: 14.6 g/dL (ref 13.0–17.0)
MCH: 29.7 pg (ref 26.0–34.0)
MCHC: 32.2 g/dL (ref 30.0–36.0)
MCV: 92.3 fL (ref 78.0–100.0)
Platelets: 134 10*3/uL — ABNORMAL LOW (ref 150–400)
RBC: 4.91 MIL/uL (ref 4.22–5.81)
RDW: 14.9 % (ref 11.5–15.5)
WBC: 5.1 10*3/uL (ref 4.0–10.5)

## 2016-11-11 LAB — PROTIME-INR
INR: 2.25
Prothrombin Time: 25.2 seconds — ABNORMAL HIGH (ref 11.4–15.2)

## 2016-11-11 LAB — LACTATE DEHYDROGENASE: LDH: 206 U/L — ABNORMAL HIGH (ref 98–192)

## 2016-11-11 NOTE — Progress Notes (Signed)
[name] comes to clinic for 2 month f/u visit.  Doppler MAP: 88 Automatic BP: 114/72 (89) Weight: 166.6 lbs Last Weight: 165.2 lbs HR: 50 SPO2: 100%  VAD interrogation revealed (reviewed with Dr. Haroldine Laws): Speed:  9200 Flow: 4.1   Power: 4.9w PI: 6.9 Alarms: FEW low voltage Events: 5-8 PI events every few days.  Fixed speed: 9200 Low speed limit: 8600 Primary Controller:  Replace back up battery in 38months.  Back up controller:   Replace back up battery in 18 months.   I reviewed the LVAD parameters from today, and compared the results to the patient's prior recorded data.  No programming changes were made. The LVAD is functioning within specified parameters.  The patient performs LVAD self-test daily.  LVAD interrogation shows no change in PI events, power surges, or speed drops. LVAD equipment check completed and is in good working order. Back-up equipment not present.   Drive Line: Dressing changes are being performed weekly using weekly dressing kits and cleaning with SALINE WIPES (CHG allergy). Anchor in place and accurately applied. Pt states that he does not need any dressing kits today.   Encounter Details: Reports to clinic for 2 month follow up. Feels great and has no complaints today. Denies specifically any dizziness, cheat pain, SOB, orthopnea or swelling. Has not needed any PRN diuretics in several months.    LDH 206 >> stable within established baseline 200 - 300. Denies tea-colored urine.   Creat1.35>> stable    Pt had annual chest xray and echo today. Results given to pt.   Patient Instructions: Return to Clinic in 2 months.    Tanda Rockers RN, BSN VAD Coordinator   Office: [phone] 24/7 Emergency VAD Pager: [phone]

## 2016-11-11 NOTE — Progress Notes (Signed)
Echocardiogram 2D Echocardiogram has been performed.  Johnny Oneill 11/11/2016, 9:52 AM

## 2016-11-13 ENCOUNTER — Telehealth (HOSPITAL_COMMUNITY): Payer: Self-pay | Admitting: *Deleted

## 2016-11-13 ENCOUNTER — Ambulatory Visit (HOSPITAL_COMMUNITY): Payer: Self-pay | Admitting: Pharmacist

## 2016-11-13 LAB — POCT INR: INR: 2.2

## 2016-11-13 NOTE — Telephone Encounter (Signed)
Patient's wife called in for his recent INR from this past Wednesday.  Called her back and she is aware of lab results.  No further questions at this time.

## 2016-11-19 ENCOUNTER — Other Ambulatory Visit (HOSPITAL_COMMUNITY): Payer: Self-pay

## 2016-11-19 ENCOUNTER — Ambulatory Visit (HOSPITAL_COMMUNITY): Payer: Self-pay | Admitting: Pharmacist

## 2016-11-19 DIAGNOSIS — Z95811 Presence of heart assist device: Secondary | ICD-10-CM

## 2016-11-19 DIAGNOSIS — Z7901 Long term (current) use of anticoagulants: Secondary | ICD-10-CM

## 2016-11-19 LAB — POCT INR: INR: 2.8

## 2016-11-19 MED ORDER — WARFARIN SODIUM 5 MG PO TABS: ORAL_TABLET | ORAL | 6 refills | Status: DC

## 2016-11-26 ENCOUNTER — Ambulatory Visit (HOSPITAL_COMMUNITY): Payer: Self-pay | Admitting: Pharmacist

## 2016-11-26 LAB — POCT INR: INR: 2.1

## 2016-12-03 ENCOUNTER — Ambulatory Visit (HOSPITAL_COMMUNITY): Payer: Self-pay | Admitting: Pharmacist

## 2016-12-03 DIAGNOSIS — Z7901 Long term (current) use of anticoagulants: Secondary | ICD-10-CM | POA: Diagnosis not present

## 2016-12-03 LAB — POCT INR: INR: 2.2

## 2016-12-10 ENCOUNTER — Ambulatory Visit (HOSPITAL_COMMUNITY): Payer: Self-pay | Admitting: Pharmacist

## 2016-12-10 LAB — POCT INR: INR: 2.5

## 2016-12-17 ENCOUNTER — Ambulatory Visit (HOSPITAL_COMMUNITY): Payer: Self-pay | Admitting: Pharmacist

## 2016-12-17 LAB — POCT INR: INR: 2.2

## 2016-12-19 NOTE — Addendum Note (Signed)
Encounter addended by: Jolaine Artist, MD on: 12/19/2016 11:29 PM<BR>    Actions taken: Visit diagnoses modified, LOS modified, Charge Capture section accepted, Sign clinical note

## 2016-12-19 NOTE — Progress Notes (Signed)
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   Admitted 3/4-3/25/15 with SOB and syncopal episode. Found to have SBO and NG placed and started on TPN. On 11/06/13 developed progressive SOB and hypotension and co-ox 51% and Hgb 6.7. Started on milrinone and transfused. Echo showed nl RV function. Patient had PEA arrest and was intubated and started on pressors. CXR revealed a R hemothorax and a R CT placed. He required VATs and evacuation of hemothorax and was then tansferred to CIR.   Follow up for Heart Failure: Returns for routine f/u. Feels great. Very active. Denies dyspnea, CP, orthopnea or PND. No problems with VAD. No bleeding or neuro symptoms. Driveline site ok. No fevers or chills. Taking lasix PRN. ICD at [facility]  Echo today reviewed personally EF 10-15%. RV moderate HK. LVAD cannula ok. Mild AI   Echo 9/16 EF 20%, LVAD cannula ok moderate RV dilation with mild to moderately decreased RV systolic function.    VAD interrogation revealed: Speed:  9200 Flow: 4.1                      Power: 4.9w PI: 6.9 Alarms: FEW low voltage Events: 5-8 PI events every few days.  Fixed speed: 9200 Low speed limit: 8600 Primary Controller:  Replace back up battery in 15months.  Back up controller:   Replace back up battery in 18 months.    Past Medical History:  Diagnosis Date  . Anxiety   . Automatic implantable cardioverter-defibrillator in situ   . CAD (coronary artery disease)    S/P stenting of LCX in 2004;  s/p Lat MI 2009 with occlusion of the LCX - treated with Promus stenting. Has total occlusion of the RCA.   [name] CHF (congestive heart failure) ([place])   . Hypercholesteremia   . Hypertension   . Hypothyroidism   . ICD (implantable cardiac defibrillator) in place    PROPHYLACTIC      medtronic  . Inferior MI ([place]) "? date"; 2009  . Ischemic  cardiomyopathy    a. 10/09 Echo: Sev LV Dysfxn, inf/lat AK, Mod MR.  [name] LVAD (left ventricular assist device) present ([place])   . Osteoarthritis    a. s/p R TKR  . Pacemaker   . PVC (premature ventricular contraction)   . RBBB   . Shortness of breath     Current Outpatient Prescriptions  Medication Sig Dispense Refill  . acetaminophen (TYLENOL) 500 MG chewable tablet Chew 500 mg by mouth every 6 (six) hours as needed for pain.    [name] aspirin EC 325 MG tablet Take 325 mg by mouth daily.    . citalopram (CELEXA) 10 MG tablet Take 1 tablet (10 mg total) by mouth daily. 90 tablet 3  . Coenzyme Q10 (COQ10) 200 MG CAPS Take 200 mg by mouth daily.    . hydrALAZINE (APRESOLINE) 50 MG tablet Take 1 tablet (50 mg total) by mouth 3 (three) times daily. 270 tablet 3  . levothyroxine (SYNTHROID, LEVOTHROID) 125 MCG tablet Take 125-187.5 mcg by mouth daily before breakfast. Take 125 mcg (1 tablet) daily except 187.5 mcg (1 and 1/2 tablets) on Sunday    . pantoprazole (PROTONIX) 40 MG tablet Take 40 mg by mouth daily.    . Probiotic Product (PROBIOTIC PO) Take 1 capsule by mouth daily.    [name]  ranitidine (ZANTAC) 150 MG tablet Take 150 mg by mouth daily as needed for heartburn.    . rosuvastatin (CRESTOR) 20 MG tablet Take 1 tablet (20 mg total) by mouth daily. 90 tablet 3  . spironolactone (ALDACTONE) 25 MG tablet Take 1 tablet (25 mg total) by mouth daily. 30 tablet 3  . temazepam (RESTORIL) 15 MG capsule Take 1 capsule (15 mg total) by mouth at bedtime. 30 capsule 0  . furosemide (LASIX) 20 MG tablet Take 20 mg by mouth daily as needed (Take 20 mg as daily as needed for wt > 153 lbs). Reported on 01/14/2016    . potassium chloride SA (K-DUR,KLOR-CON) 20 MEQ tablet Take 1 tablet (20 mEq total) by mouth daily as needed. Take one potassium pill when you take your lasix. (Patient not taking: Reported on 11/11/2016) 90 tablet 3  . warfarin (COUMADIN) 5 MG tablet Take 10 mg (2 tablets) on Wed and Sun, and take 7.5 mg  (1.5 tablets) on all other days or as directed by VAD clinic. 60 tablet 6   No current facility-administered medications for this encounter.    Facility-Administered Medications Ordered in Other Encounters  Medication Dose Route Frequency Provider Last Rate Last Dose  . etomidate (AMIDATE) injection    Anesthesia Intra-op Myna Bright, CRNA   20 mg at 02/08/14 0915  . rocuronium [facility]) injection    Anesthesia Intra-op Myna Bright, CRNA   50 mg at 02/08/14 0932  . succinylcholine (ANECTINE) injection    Anesthesia Intra-op Myna Bright, CRNA   100 mg at 02/08/14 0915    Ace inhibitors; Diovan [valsartan]; Lipitor [atorvastatin calcium]; and Norvasc [amlodipine]  REVIEW OF SYSTEMS: All systems negative except as listed in HPI, PMH and Problem list.   Vitals:   11/11/16 1506  BP: 114/72  Pulse: (!) 50  SpO2: 100%  Weight: 166 lb 9.6 oz (75.6 kg)  Height: 5\' 9"  (1.753 m)    Vital signs: BP 114/72 Comment: map-89  Pulse (!) 50   Ht 5\' 9"  (1.753 m)   Wt 166 lb 9.6 oz (75.6 kg)   SpO2 100%   BMI 24.60 kg/m  MAP 82  Physical Exam: GENERAL: Well appearing, male who presents to clinic today in no acute distress HEENT: normal anicteric NECK: Supple, JVP 5-6. No carotid bruits No lymphadenopathy or thyromegaly appreciated.    CARDIAC:  Mechanical heart sounds with LVAD hum present. Normal VAD sounds LUNGS:  Clear ABDOMEN:  Soft, round, NT/ND, good BS. No distension. nontender LVAD exit site: well-healed and semi incorporated.  Dressing dry and intact. Stabilization device present and accurately applied.  Driveline dressing is being changed weekly per sterile technique. LVAD site clear  EXTREMITIES:  Warm and dry, no cyanosis, clubbing, rash.  No edema.  NEUROLOGIC:  Alert and oriented x 4.  CNs intact. Gait steady.  No aphasia.  No dysarthria.  Affect pleasant.    ASSESSMENT AND PLAN:   1) Chronic systolic HF: ICM, EF 63-87% s/p LVAD HM II implant 08/2013 for  DT.   - Doing extremely well. Continue with NYHA I-early II symptoms - Echo reviewed today personally mild AI. Mod RV dysfunction. LVAD cannula ok. - Volume status ok. Continue Lasix prn - MAP ok.  Continue current regimen. He has not tolerated ACE-I or amlodipine in the past. - ICD at [facility]. Has seen Dr. Rayann Heman in 2/18 and decided not to replace. I agree with this.  2) LVAD: HMII 08/2013 for DT.  Doing well.  LVAD parameters stable. 3) Chronic anticoagulation: INR goal 2.0-3.0.   No bleeding issues.   - INR 2.25 today. LDH 206 - Continue ASA 325 mg for LVAD + warfarin. 4) HTN:  --Blood pressure well controlled. Continue current regimen. 5) Hypothyroidism:  ---Per Dr Dwyane Dee. Continue synthroid 150 mcg. 6) Hyperlipidemia:  -- he is on Crestor. Zetia stopped due to cost.  7) CAD --stable. No s/s ischemia 8) Frequent PVCs --Continue with bigeminy and trigeminy. Has been seen by Dr.Allred. Felt not to be good candidate for ablation or amio. He is doing well with conservative management in setting of VAD support. D/w Dr. Rayann Heman.   Glori Bickers  MD  12/19/2016

## 2016-12-24 ENCOUNTER — Ambulatory Visit (HOSPITAL_COMMUNITY): Payer: Self-pay | Admitting: Pharmacist

## 2016-12-24 LAB — POCT INR: INR: 2.8

## 2016-12-28 ENCOUNTER — Other Ambulatory Visit (HOSPITAL_COMMUNITY): Payer: Self-pay | Admitting: Internal Medicine

## 2016-12-28 DIAGNOSIS — K219 Gastro-esophageal reflux disease without esophagitis: Secondary | ICD-10-CM

## 2016-12-29 ENCOUNTER — Other Ambulatory Visit (HOSPITAL_COMMUNITY): Payer: Self-pay | Admitting: *Deleted

## 2016-12-29 DIAGNOSIS — I255 Ischemic cardiomyopathy: Secondary | ICD-10-CM

## 2016-12-29 MED ORDER — CITALOPRAM HYDROBROMIDE 10 MG PO TABS: 10.0000 mg | ORAL_TABLET | Freq: Every day | ORAL | 3 refills | Status: DC

## 2016-12-31 ENCOUNTER — Ambulatory Visit (HOSPITAL_COMMUNITY): Payer: Self-pay | Admitting: Pharmacist

## 2016-12-31 DIAGNOSIS — Z7901 Long term (current) use of anticoagulants: Secondary | ICD-10-CM | POA: Diagnosis not present

## 2016-12-31 LAB — POCT INR: INR: 2.3

## 2017-01-05 DIAGNOSIS — M25561 Pain in right knee: Secondary | ICD-10-CM | POA: Diagnosis not present

## 2017-01-07 ENCOUNTER — Ambulatory Visit (HOSPITAL_COMMUNITY): Payer: Self-pay | Admitting: Pharmacist

## 2017-01-07 LAB — POCT INR: INR: 2.6

## 2017-01-12 ENCOUNTER — Ambulatory Visit (HOSPITAL_COMMUNITY): Payer: Self-pay | Admitting: Pharmacist

## 2017-01-12 ENCOUNTER — Ambulatory Visit (HOSPITAL_COMMUNITY)
Admission: RE | Admit: 2017-01-12 | Discharge: 2017-01-12 | Disposition: A | Discharge status: Home / Self Care | Payer: Medicare Other | Source: Ambulatory Visit | Attending: Cardiology | Admitting: Cardiology

## 2017-01-12 ENCOUNTER — Encounter (HOSPITAL_COMMUNITY): Payer: Self-pay

## 2017-01-12 VITALS — BP 112/67 | HR 42 | Resp 12 | Ht 69.0 in | Wt 162.0 lb

## 2017-01-12 DIAGNOSIS — Z79899 Other long term (current) drug therapy: Secondary | ICD-10-CM | POA: Insufficient documentation

## 2017-01-12 DIAGNOSIS — I255 Ischemic cardiomyopathy: Secondary | ICD-10-CM | POA: Diagnosis not present

## 2017-01-12 DIAGNOSIS — I10 Essential (primary) hypertension: Secondary | ICD-10-CM

## 2017-01-12 DIAGNOSIS — I493 Ventricular premature depolarization: Secondary | ICD-10-CM | POA: Diagnosis not present

## 2017-01-12 DIAGNOSIS — Z95811 Presence of heart assist device: Secondary | ICD-10-CM | POA: Diagnosis not present

## 2017-01-12 DIAGNOSIS — E785 Hyperlipidemia, unspecified: Secondary | ICD-10-CM | POA: Insufficient documentation

## 2017-01-12 DIAGNOSIS — Z7901 Long term (current) use of anticoagulants: Secondary | ICD-10-CM | POA: Diagnosis not present

## 2017-01-12 DIAGNOSIS — E039 Hypothyroidism, unspecified: Secondary | ICD-10-CM | POA: Diagnosis not present

## 2017-01-12 DIAGNOSIS — I5022 Chronic systolic (congestive) heart failure: Secondary | ICD-10-CM | POA: Diagnosis not present

## 2017-01-12 DIAGNOSIS — I2510 Atherosclerotic heart disease of native coronary artery without angina pectoris: Secondary | ICD-10-CM | POA: Diagnosis not present

## 2017-01-12 DIAGNOSIS — I110 Hypertensive heart disease with heart failure: Secondary | ICD-10-CM | POA: Insufficient documentation

## 2017-01-12 DIAGNOSIS — Z7982 Long term (current) use of aspirin: Secondary | ICD-10-CM | POA: Insufficient documentation

## 2017-01-12 LAB — BASIC METABOLIC PANEL
Anion gap: 7 (ref 5–15)
BUN: 25 mg/dL — ABNORMAL HIGH (ref 6–20)
CO2: 25 mmol/L (ref 22–32)
Calcium: 9 mg/dL (ref 8.9–10.3)
Chloride: 105 mmol/L (ref 101–111)
Creatinine, Ser: 1.47 mg/dL — ABNORMAL HIGH (ref 0.61–1.24)
GFR calc Af Amer: 53 mL/min — ABNORMAL LOW (ref >60)
GFR calc non Af Amer: 46 mL/min — ABNORMAL LOW (ref >60)
Glucose, Bld: 64 mg/dL — ABNORMAL LOW (ref 65–99)
Potassium: 4.9 mmol/L (ref 3.5–5.1)
Sodium: 137 mmol/L (ref 135–145)

## 2017-01-12 LAB — CBC
HCT: 47.2 % (ref 39.0–52.0)
Hemoglobin: 15.4 g/dL (ref 13.0–17.0)
MCH: 30 pg (ref 26.0–34.0)
MCHC: 32.6 g/dL (ref 30.0–36.0)
MCV: 91.8 fL (ref 78.0–100.0)
Platelets: 129 10*3/uL — ABNORMAL LOW (ref 150–400)
RBC: 5.14 MIL/uL (ref 4.22–5.81)
RDW: 15.8 % — ABNORMAL HIGH (ref 11.5–15.5)
WBC: 6.1 10*3/uL (ref 4.0–10.5)

## 2017-01-12 LAB — PROTIME-INR
INR: 2.68
Prothrombin Time: 29 seconds — ABNORMAL HIGH (ref 11.4–15.2)

## 2017-01-12 LAB — LACTATE DEHYDROGENASE: LDH: 327 U/L — ABNORMAL HIGH (ref 98–192)

## 2017-01-12 NOTE — Progress Notes (Signed)
Patient presents for 2 month  follow up in [facility] today. Reports no problems with VAD equipment or concerns with drive line.  Vital Signs:  Doppler Pressure 100   Automatc BP: 112/67 HR:42  - EKG obtained to verify. SPO2:98  %  Weight: 162 lb w/o eqt Last weight: 166 lb Home weights: 160 lbs   VAD Indication: DT    VAD interrogation & Equipment Management: Speed:9200 Flow: 4.5 Power:5.1 w    PI:7.3  Alarms: no clinical alarms Events: 5-10 PI events daily  Fixed speed 9200 Low speed limit: 8600  Primary Controller:  Replace back up battery in 48months. Back up controller:   Replace back up battery in 16 months.  Annual Equipment Maintenance on UBC/PM was performed on 09/2015.   I reviewed the LVAD parameters from today and compared the results to the patient's prior recorded data. LVAD interrogation was NEGATIVE for significant power changes, NEGATIVE for clinical alarms and STABLE for PI events/speed drops. No programming changes were made and pump is functioning within specified parameters. Pt is performing daily controller and system monitor self tests along with completing weekly and monthly maintenance for LVAD equipment.  LVAD equipment check completed and is in good working order. Back-up equipment present. Charged back up battery and performed self-test on equipment.   Exit Site Care: Dressing changes are being performed weekly using weekly dressing kits and cleaning with SALINE WIPES (CHG allergy). Anchor in place and accurately applied. 10 weekly dressing kits given to patient.  Significant Events on VAD Support:  none  Device:Medtronic ERI Therapies: off Last check: 10/2016   BP & Labs:  MAP100 - Doppler is reflecting modified systolic  Hgb 44.9 - No S/S of bleeding. Specifically denies melena/BRBPR or nosebleeds.  LDH higher at 327 with established baseline of 200- 250. Denies tea-colored urine. No power elevations noted on  interrogation.   Plan: 1. RTC in one week for LDH/INR 2. VAD visit made for 2 months.  Balinda Quails RN [place] Coordinator   Office: [phone] 24/7 Emergency VAD Pager: [phone]

## 2017-01-12 NOTE — Progress Notes (Signed)
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   Admitted 3/4-3/25/15 with SOB and syncopal episode. Found to have SBO and NG placed and started on TPN. On 11/06/13 developed progressive SOB and hypotension and co-ox 51% and Hgb 6.7. Started on milrinone and transfused. Echo showed nl RV function. Patient had PEA arrest and was intubated and started on pressors. CXR revealed a R hemothorax and a R CT placed. He required VATs and evacuation of hemothorax and was then tansferred to CIR.   Follow up for Heart Failure: Returns for routine f/u. Continues to do very well  Very active. Volunteering,  Denies dyspnea, CP or edema. No edema. . No problems with VAD. No bleeding or neuro symptoms. Driveline site ok. No fevers or chills. ICD at [facility]  Echo 3/18 EF 10-15%. RV moderate HK. LVAD cannula ok. Mild AI   Echo 9/16 EF 20%, LVAD cannula ok moderate RV dilation with mild to moderately decreased RV systolic function.    VAD Indication: DT    VAD interrogation & Equipment Management: Speed:9200 Flow: 4.5 Power:5.1 w PI:7.3  Alarms: no clinical alarms Events: 5-10 PI events daily  Fixed speed 9200 Low speed limit: 8600  Primary Controller: Replace back up battery in 30months.    Past Medical History:  Diagnosis Date  . Anxiety   . Automatic implantable cardioverter-defibrillator in situ   . CAD (coronary artery disease)    S/P stenting of LCX in 2004;  s/p Lat MI 2009 with occlusion of the LCX - treated with Promus stenting. Has total occlusion of the RCA.   [name] CHF (congestive heart failure) ([place])   . Hypercholesteremia   . Hypertension   . Hypothyroidism   . ICD (implantable cardiac defibrillator) in place    PROPHYLACTIC      medtronic  . Inferior MI ([place]) "? date"; 2009  . Ischemic cardiomyopathy    a. 10/09 Echo: Sev LV Dysfxn,  inf/lat AK, Mod MR.  [name] LVAD (left ventricular assist device) present ([place])   . Osteoarthritis    a. s/p R TKR  . Pacemaker   . PVC (premature ventricular contraction)   . RBBB   . Shortness of breath     Current Outpatient Prescriptions  Medication Sig Dispense Refill  . acetaminophen (TYLENOL) 500 MG chewable tablet Chew 500 mg by mouth every 6 (six) hours as needed for pain.    [name] aspirin EC 325 MG tablet Take 325 mg by mouth daily.    . citalopram (CELEXA) 10 MG tablet Take 1 tablet (10 mg total) by mouth daily. 90 tablet 3  . Coenzyme Q10 (COQ10) 200 MG CAPS Take 200 mg by mouth daily.    . furosemide (LASIX) 20 MG tablet Take 20 mg by mouth daily as needed (Take 20 mg as daily as needed for wt > 153 lbs). Reported on 01/14/2016    . hydrALAZINE (APRESOLINE) 50 MG tablet Take 1 tablet (50 mg total) by mouth 3 (three) times daily. 270 tablet 3  . levothyroxine (SYNTHROID, LEVOTHROID) 125 MCG tablet Take 125-187.5 mcg by mouth daily before breakfast. Take 125 mcg (1 tablet) daily except 187.5 mcg (1 and 1/2 tablets) on Sunday    . pantoprazole (PROTONIX) 40 MG tablet TAKE ONE TABLET BY MOUTH ONCE DAILY 90 tablet 3  . potassium chloride SA (K-DUR,KLOR-CON) 20 MEQ tablet  Take 1 tablet (20 mEq total) by mouth daily as needed. Take one potassium pill when you take your lasix. 90 tablet 3  . Probiotic Product (PROBIOTIC PO) Take 1 capsule by mouth daily.    . ranitidine (ZANTAC) 150 MG tablet Take 150 mg by mouth daily as needed for heartburn.    . rosuvastatin (CRESTOR) 20 MG tablet Take 1 tablet (20 mg total) by mouth daily. 90 tablet 3  . spironolactone (ALDACTONE) 25 MG tablet Take 1 tablet (25 mg total) by mouth daily. 30 tablet 3  . temazepam (RESTORIL) 15 MG capsule Take 1 capsule (15 mg total) by mouth at bedtime. 30 capsule 0  . warfarin (COUMADIN) 5 MG tablet Take 10 mg (2 tablets) on Wed and Sun, and take 7.5 mg (1.5 tablets) on all other days or as directed by VAD clinic. 60 tablet 6    No current facility-administered medications for this encounter.    Facility-Administered Medications Ordered in Other Encounters  Medication Dose Route Frequency Provider Last Rate Last Dose  . etomidate (AMIDATE) injection    Anesthesia Intra-op Myna Bright, CRNA   20 mg at 02/08/14 0915  . rocuronium [facility]) injection    Anesthesia Intra-op Myna Bright, CRNA   50 mg at 02/08/14 0932  . succinylcholine (ANECTINE) injection    Anesthesia Intra-op Myna Bright, CRNA   100 mg at 02/08/14 0915    Ace inhibitors; Diovan [valsartan]; Lipitor [atorvastatin calcium]; and Norvasc [amlodipine]  REVIEW OF SYSTEMS: All systems negative except as listed in HPI, PMH and Problem list.   Vitals:   01/12/17 1015  BP: 112/67  Pulse: (!) 42  Resp: 12  SpO2: 100%  Weight: 162 lb (73.5 kg)  Height: 5\' 9"  (1.753 m)   Vital Signs:  Doppler Pressure 100              Automatc BP: 112/67 HR:87  SPO2:98  %  Weight: 162 lb w/o eqt Last weight: 166 lb Home weights: 160 lbs   Vital signs: BP 112/67   Pulse (!) 42   Resp 12   Ht 5\' 9"  (1.753 m)   Wt 162 lb (73.5 kg)   SpO2 100%   BMI 23.92 kg/m    Physical Exam: GENERAL: Well appearing NAD HEENT: normal aniceric NECK: Supple, JVP 5  No carotid bruits No lymphadenopathy or thyromegaly appreciated.    CARDIAC:  Mechanical heart sounds with LVAD hum present. Normal VAD sounds  LUNGS:  Clear no wheeze ABDOMEN:  Soft NT/ND good BS LVAD exit site: well-healed and semi incorporated.  Dressing dry and intact. Stabilization device present and accurately applied.  Driveline dressing is being changed weekly per sterile technique. No evidence infections   EXTREMITIES:  Warm and dry, No C/C/E   NEUROLOGIC:  Alert and oriented x 4.  CNs intact. Gait steady.  No aphasia.  No dysarthria.  Affect pleasant.    ASSESSMENT AND PLAN:   1) Chronic systolic HF: ICM, EF 62-37% s/p LVAD HM II implant 08/2013 for DT.   - Doing  extremely well. Continue with NYHA I-early II symptoms - Echo 3/18 EF 10-15% mild AI. Mod RV dysfunction. LVAD cannula ok. - Volume status looks good. Continue Lasix prn - MAP ok.  Continue current regimen. He has not tolerated ACE-I or amlodipine in the past. - ICD at [facility]. Has seen Dr. Rayann Heman in 2/18 and decided not to replace. I agree with this.  2) LVAD: HMII 08/2013 for DT.  Doing  well. LVAD parameters stable. 3) Chronic anticoagulation: INR goal 2.0-3.0.   No bleeding issues.   - INR 2.68 today. LDH up slightly at 327 -> will repeat next week. No power spikes - Continue ASA 325 mg for LVAD + warfarin. Dosing d/w PharmD. 4) HTN:  --Blood pressure well controlled. Continue current regimen. 5) Hypothyroidism:  ---Per Dr Dwyane Dee. Continue synthroid 150 mcg. 6) Hyperlipidemia:  -- he is on Crestor. Zetia stopped due to cost.  7) CAD --stable. No s/s ischemia 8) Frequent PVCs --ECG today with NSR 87 + bigeminy. Has been seen by Dr.Allred. Felt not to be good candidate for ablation or amio. He is doing well with conservative management in setting of VAD support.   Glori Bickers  MD  01/12/2017

## 2017-01-12 NOTE — Patient Instructions (Signed)
Return to clinic in one week for labs.

## 2017-01-15 ENCOUNTER — Ambulatory Visit (HOSPITAL_COMMUNITY): Payer: Self-pay | Admitting: Pharmacist

## 2017-01-15 LAB — POCT INR: INR: 2.6

## 2017-01-19 ENCOUNTER — Other Ambulatory Visit (HOSPITAL_COMMUNITY): Payer: Self-pay | Admitting: *Deleted

## 2017-01-19 ENCOUNTER — Inpatient Hospital Stay (HOSPITAL_COMMUNITY)
Admission: AD | Admit: 2017-01-19 | Discharge: 2017-01-22 | DRG: 315 | Disposition: A | Discharge status: Home / Self Care | Payer: Medicare Other | Source: Ambulatory Visit | Attending: Internal Medicine | Admitting: Internal Medicine

## 2017-01-19 ENCOUNTER — Encounter (HOSPITAL_COMMUNITY): Payer: Self-pay

## 2017-01-19 ENCOUNTER — Encounter (HOSPITAL_COMMUNITY): Payer: Medicare Other

## 2017-01-19 ENCOUNTER — Ambulatory Visit (HOSPITAL_COMMUNITY)
Admission: RE | Admit: 2017-01-19 | Discharge: 2017-01-19 | Disposition: A | Discharge status: Home / Self Care | Payer: Medicare Other | Source: Ambulatory Visit | Attending: Internal Medicine | Admitting: Internal Medicine

## 2017-01-19 ENCOUNTER — Encounter (HOSPITAL_COMMUNITY): Payer: Self-pay | Admitting: *Deleted

## 2017-01-19 ENCOUNTER — Ambulatory Visit (HOSPITAL_COMMUNITY): Payer: Self-pay | Admitting: Pharmacist

## 2017-01-19 DIAGNOSIS — Z7901 Long term (current) use of anticoagulants: Secondary | ICD-10-CM

## 2017-01-19 DIAGNOSIS — I110 Hypertensive heart disease with heart failure: Secondary | ICD-10-CM | POA: Diagnosis present

## 2017-01-19 DIAGNOSIS — Z95811 Presence of heart assist device: Secondary | ICD-10-CM

## 2017-01-19 DIAGNOSIS — Z96651 Presence of right artificial knee joint: Secondary | ICD-10-CM | POA: Diagnosis present

## 2017-01-19 DIAGNOSIS — I2510 Atherosclerotic heart disease of native coronary artery without angina pectoris: Secondary | ICD-10-CM | POA: Diagnosis present

## 2017-01-19 DIAGNOSIS — T82867A Thrombosis of cardiac prosthetic devices, implants and grafts, initial encounter: Principal | ICD-10-CM | POA: Diagnosis present

## 2017-01-19 DIAGNOSIS — R740 Nonspecific elevation of levels of transaminase and lactic acid dehydrogenase [LDH]: Secondary | ICD-10-CM

## 2017-01-19 DIAGNOSIS — T829XXA Unspecified complication of cardiac and vascular prosthetic device, implant and graft, initial encounter: Secondary | ICD-10-CM | POA: Diagnosis not present

## 2017-01-19 DIAGNOSIS — I252 Old myocardial infarction: Secondary | ICD-10-CM

## 2017-01-19 DIAGNOSIS — I5022 Chronic systolic (congestive) heart failure: Secondary | ICD-10-CM

## 2017-01-19 DIAGNOSIS — Z95810 Presence of automatic (implantable) cardiac defibrillator: Secondary | ICD-10-CM | POA: Diagnosis not present

## 2017-01-19 DIAGNOSIS — Z955 Presence of coronary angioplasty implant and graft: Secondary | ICD-10-CM | POA: Diagnosis not present

## 2017-01-19 DIAGNOSIS — E7800 Pure hypercholesterolemia, unspecified: Secondary | ICD-10-CM | POA: Diagnosis present

## 2017-01-19 DIAGNOSIS — Z7982 Long term (current) use of aspirin: Secondary | ICD-10-CM | POA: Diagnosis not present

## 2017-01-19 DIAGNOSIS — I428 Other cardiomyopathies: Secondary | ICD-10-CM | POA: Diagnosis present

## 2017-01-19 DIAGNOSIS — E039 Hypothyroidism, unspecified: Secondary | ICD-10-CM | POA: Diagnosis present

## 2017-01-19 LAB — CBC WITH DIFFERENTIAL/PLATELET
Basophils Absolute: 0 10*3/uL (ref 0.0–0.1)
Basophils Relative: 0 %
Eosinophils Absolute: 0.1 10*3/uL (ref 0.0–0.7)
Eosinophils Relative: 2 %
HCT: 42.5 % (ref 39.0–52.0)
Hemoglobin: 13.7 g/dL (ref 13.0–17.0)
Lymphocytes Relative: 18 %
Lymphs Abs: 1.3 10*3/uL (ref 0.7–4.0)
MCH: 29.6 pg (ref 26.0–34.0)
MCHC: 32.2 g/dL (ref 30.0–36.0)
MCV: 91.8 fL (ref 78.0–100.0)
Monocytes Absolute: 0.4 10*3/uL (ref 0.1–1.0)
Monocytes Relative: 5 %
Neutro Abs: 5.3 10*3/uL (ref 1.7–7.7)
Neutrophils Relative %: 75 %
Platelets: 117 10*3/uL — ABNORMAL LOW (ref 150–400)
RBC: 4.63 MIL/uL (ref 4.22–5.81)
RDW: 16.2 % — ABNORMAL HIGH (ref 11.5–15.5)
WBC: 7.1 10*3/uL (ref 4.0–10.5)

## 2017-01-19 LAB — APTT
aPTT: 99 seconds — ABNORMAL HIGH (ref 24–36)
aPTT: 97 seconds — ABNORMAL HIGH (ref 24–36)

## 2017-01-19 LAB — URINALYSIS, ROUTINE W REFLEX MICROSCOPIC
Bilirubin Urine: NEGATIVE
Glucose, UA: NEGATIVE mg/dL
Hgb urine dipstick: NEGATIVE
Ketones, ur: NEGATIVE mg/dL
Leukocytes, UA: NEGATIVE
Nitrite: NEGATIVE
Protein, ur: NEGATIVE mg/dL
Specific Gravity, Urine: 1.009 (ref 1.005–1.030)
pH: 5 (ref 5.0–8.0)

## 2017-01-19 LAB — COMPREHENSIVE METABOLIC PANEL
ALT: 19 U/L (ref 17–63)
AST: 26 U/L (ref 15–41)
Albumin: 4 g/dL (ref 3.5–5.0)
Alkaline Phosphatase: 78 U/L (ref 38–126)
Anion gap: 9 (ref 5–15)
BUN: 18 mg/dL (ref 6–20)
CO2: 20 mmol/L — ABNORMAL LOW (ref 22–32)
Calcium: 8.5 mg/dL — ABNORMAL LOW (ref 8.9–10.3)
Chloride: 109 mmol/L (ref 101–111)
Creatinine, Ser: 1.35 mg/dL — ABNORMAL HIGH (ref 0.61–1.24)
GFR calc Af Amer: 59 mL/min — ABNORMAL LOW (ref >60)
GFR calc non Af Amer: 50 mL/min — ABNORMAL LOW (ref >60)
Glucose, Bld: 106 mg/dL — ABNORMAL HIGH (ref 65–99)
Potassium: 4.1 mmol/L (ref 3.5–5.1)
Sodium: 138 mmol/L (ref 135–145)
Total Bilirubin: 0.6 mg/dL (ref 0.3–1.2)
Total Protein: 6.2 g/dL — ABNORMAL LOW (ref 6.5–8.1)

## 2017-01-19 LAB — PROTIME-INR
INR: 2.42
INR: 4.44
Prothrombin Time: 26.7 seconds — ABNORMAL HIGH (ref 11.4–15.2)
Prothrombin Time: 42.5 seconds — ABNORMAL HIGH (ref 11.4–15.2)

## 2017-01-19 LAB — LACTATE DEHYDROGENASE
LDH: 424 U/L — ABNORMAL HIGH (ref 98–192)
LDH: 438 U/L — ABNORMAL HIGH (ref 98–192)

## 2017-01-19 MED ORDER — ACETAMINOPHEN 325 MG PO TABS: 650.0000 mg | ORAL_TABLET | ORAL | Status: DC | PRN

## 2017-01-19 MED ORDER — PANTOPRAZOLE SODIUM 40 MG PO TBEC
40.0000 mg | DELAYED_RELEASE_TABLET | Freq: Every day | ORAL | Status: DC
  Administered 2017-01-20 – 2017-01-22 (×3): 40 mg via ORAL
  Filled 2017-01-19 (×3): qty 1

## 2017-01-19 MED ORDER — LEVOTHYROXINE SODIUM 125 MCG PO TABS
125.0000 ug | ORAL_TABLET | ORAL | Status: DC
  Administered 2017-01-20 – 2017-01-22 (×3): 125 ug via ORAL
  Filled 2017-01-19 (×3): qty 1

## 2017-01-19 MED ORDER — TEMAZEPAM 15 MG PO CAPS
15.0000 mg | ORAL_CAPSULE | Freq: Every day | ORAL | Status: DC
  Administered 2017-01-19 – 2017-01-21 (×3): 15 mg via ORAL
  Filled 2017-01-19 (×3): qty 1

## 2017-01-19 MED ORDER — LEVOTHYROXINE SODIUM 75 MCG PO TABS: 187.5000 ug | ORAL_TABLET | ORAL | Status: DC

## 2017-01-19 MED ORDER — ASPIRIN EC 325 MG PO TBEC
325.0000 mg | DELAYED_RELEASE_TABLET | Freq: Every day | ORAL | Status: DC
  Administered 2017-01-20 – 2017-01-22 (×3): 325 mg via ORAL
  Filled 2017-01-19 (×3): qty 1

## 2017-01-19 MED ORDER — BACID PO TABS
1.0000 | ORAL_TABLET | Freq: Every day | ORAL | Status: DC
  Administered 2017-01-20 – 2017-01-22 (×3): 1 via ORAL
  Filled 2017-01-19 (×3): qty 1

## 2017-01-19 MED ORDER — FAMOTIDINE 20 MG PO TABS
20.0000 mg | ORAL_TABLET | Freq: Every day | ORAL | Status: DC
  Administered 2017-01-20 – 2017-01-22 (×3): 20 mg via ORAL
  Filled 2017-01-19 (×3): qty 1

## 2017-01-19 MED ORDER — ROSUVASTATIN CALCIUM 10 MG PO TABS
20.0000 mg | ORAL_TABLET | Freq: Every day | ORAL | Status: DC
  Administered 2017-01-20 – 2017-01-22 (×3): 20 mg via ORAL
  Filled 2017-01-19 (×3): qty 2

## 2017-01-19 MED ORDER — CITALOPRAM HYDROBROMIDE 20 MG PO TABS
10.0000 mg | ORAL_TABLET | Freq: Every day | ORAL | Status: DC
  Administered 2017-01-20 – 2017-01-22 (×3): 10 mg via ORAL
  Filled 2017-01-19 (×3): qty 1

## 2017-01-19 MED ORDER — SODIUM CHLORIDE 0.9 % IV SOLN
0.0500 mg/kg/h | INTRAVENOUS | Status: DC
  Filled 2017-01-19: qty 250

## 2017-01-19 MED ORDER — WARFARIN - PHARMACIST DOSING INPATIENT
Freq: Every day | Status: DC
  Administered 2017-01-19 – 2017-01-21 (×3)

## 2017-01-19 MED ORDER — SPIRONOLACTONE 25 MG PO TABS
25.0000 mg | ORAL_TABLET | Freq: Every day | ORAL | Status: DC
  Administered 2017-01-20 – 2017-01-22 (×3): 25 mg via ORAL
  Filled 2017-01-19 (×3): qty 1

## 2017-01-19 MED ORDER — HYDRALAZINE HCL 50 MG PO TABS
50.0000 mg | ORAL_TABLET | Freq: Three times a day (TID) | ORAL | Status: DC
  Administered 2017-01-19 – 2017-01-22 (×8): 50 mg via ORAL
  Filled 2017-01-19 (×8): qty 1

## 2017-01-19 MED ORDER — SODIUM CHLORIDE 0.9 % IV SOLN
0.1000 mg/kg/h | INTRAVENOUS | Status: DC
  Administered 2017-01-19: 0.1 mg/kg/h via INTRAVENOUS
  Filled 2017-01-19: qty 250

## 2017-01-19 MED ORDER — ONDANSETRON HCL 4 MG/2ML IJ SOLN: 4.0000 mg | Freq: Four times a day (QID) | INTRAMUSCULAR | Status: DC | PRN

## 2017-01-19 MED ORDER — WARFARIN SODIUM 7.5 MG PO TABS
7.5000 mg | ORAL_TABLET | Freq: Once | ORAL | Status: AC
  Administered 2017-01-19: 7.5 mg via ORAL
  Filled 2017-01-19: qty 1

## 2017-01-19 MED ORDER — SODIUM CHLORIDE 0.9 % IV SOLN
0.0800 mg/kg/h | INTRAVENOUS | Status: DC
  Filled 2017-01-19: qty 250

## 2017-01-19 MED ORDER — COQ10 200 MG PO CAPS: 200.0000 mg | ORAL_CAPSULE | Freq: Every day | ORAL | Status: DC

## 2017-01-19 NOTE — Progress Notes (Signed)
ANTICOAGULATION CONSULT NOTE - Initial Consult  Pharmacy Consult for bivalirudin and warfarin Indication: LVAD with possible pump thrombosis  Allergies  Allergen Reactions  . Ace Inhibitors     Hypotension and elevated creatinine  . Diovan [Valsartan] Other (See Comments)    Hypotension at low dose, hyperkalemia  . Lipitor [Atorvastatin Calcium] Other (See Comments)    Muscle pain  . Norvasc [Amlodipine] Other (See Comments)    Put patient to sleep   Vital Signs:    Labs:  Recent Labs  01/19/17 1009 01/19/17 1533 01/19/17 1838  HGB  --  13.7  --   HCT  --  42.5  --   PLT  --  117*  --   APTT  --   --  99*  LABPROT 26.7*  --  42.5*  INR 2.42  --  PENDING  CREATININE  --  1.35*  --     Estimated Creatinine Clearance: 48.7 mL/min (A) (by C-G formula based on SCr of 1.35 mg/dL (H)).   Medical History: Past Medical History:  Diagnosis Date  . Anxiety   . Automatic implantable cardioverter-defibrillator in situ   . CAD (coronary artery disease)    S/P stenting of LCX in 2004;  s/p Lat MI 2009 with occlusion of the LCX - treated with Promus stenting. Has total occlusion of the RCA.   [name] CHF (congestive heart failure) ([place])   . Hypercholesteremia   . Hypertension   . Hypothyroidism   . ICD (implantable cardiac defibrillator) in place    PROPHYLACTIC      medtronic  . Inferior MI ([place]) "? date"; 2009  . Ischemic cardiomyopathy    a. 10/09 Echo: Sev LV Dysfxn, inf/lat AK, Mod MR.  [name] LVAD (left ventricular assist device) present ([place])   . Osteoarthritis    a. s/p R TKR  . Pacemaker   . PVC (premature ventricular contraction)   . RBBB   . Shortness of breath     Medications:  Warfarin 7.5mg  daily except 10mg  on Sunday, Wednesday  Assessment: 76 year old male with HMII placed 09/19/13. Patient has been having upward trending LDH from 150 last year to now 430s. Patient to be admitted for possible pump thrombosis.  Discussed with cardiology and surgery, will start  bivalirudin but will continue warfarin for now. INR in clinic this morning was at goal at 2.4.   Initial aPTT above goal at 99  Goal of Therapy:  Aptt goal 50-85s INR 2-3 Monitor platelets by anticoagulation protocol: Yes   Plan:  Decrease bivalirudin to 0.08 mg/kg/hr. Recheck aptt in 2 hrs. Daily INR, aptt, CBC, LDH  Uvaldo Rising, BCPS  Clinical Pharmacist Pager [phone]  01/19/2017 7:44 PM

## 2017-01-19 NOTE — Addendum Note (Signed)
Encounter addended by: Candy Sledge, RN on: 01/19/2017 12:16 PM<BR>    Actions taken: Visit diagnoses modified, Order list changed, Diagnosis association updated

## 2017-01-19 NOTE — H&P (Signed)
Advanced Heart Failure VAD History and Physical Note   Reason for Admission: Elevated LDH, suspected pump thrombosis.    HPI:    Johnny Oneill is a 76 y.o. 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   Last seen in clinic 01/12/17. Has been doing very well with NYAH 1-early 2 symptoms. No edema, DOE, or equipment problems.  ICD at [facility] so met with EP in 10/2016 and decided not to pursue generator change.     LDH 206 in 10/2016, and up to 327 01/12/17.  Was brought back this morning to trend LDH and now up to 424. Decision made to admit for bival for possible pump thrombosis.   Pt has been doing fine overall with no symptoms of HF.  Very active,  Continues to volunteer. Has not had dyspnea, CP, or edema.  Denies bleeding or any neurological symptoms. No fevers or chills. Denies any dark colored urine. States it is darker on days where he urinates less, but usually the color of "ginger ale".   LVAD INTERROGATION:  HeartMate II LVAD:  Flow 4.4 liters/min, speed 9200, power 5, PI 6. Occasional PI events. No power spikes.  Review of Systems: [y] = yes, '[ ]'$  = no   General: Weight gain '[ ]'$ ; Weight loss '[ ]'$ ; Anorexia '[ ]'$ ; Fatigue '[ ]'$ ; Fever '[ ]'$ ; Chills '[ ]'$ ; Weakness '[ ]'$   Cardiac: Chest pain/pressure '[ ]'$ ; Resting SOB '[ ]'$ ; Exertional SOB '[ ]'$ ; Orthopnea '[ ]'$ ; Pedal Edema '[ ]'$ ; Palpitations '[ ]'$ ; Syncope '[ ]'$ ; Presyncope '[ ]'$ ; Paroxysmal nocturnal dyspnea'[ ]'$   Pulmonary: Cough '[ ]'$ ; Wheezing'[ ]'$ ; Hemoptysis'[ ]'$ ; Sputum '[ ]'$ ; Snoring '[ ]'$   GI: Vomiting'[ ]'$ ; Dysphagia'[ ]'$ ; Melena'[ ]'$ ; Hematochezia '[ ]'$ ; Heartburn'[ ]'$ ; Abdominal pain '[ ]'$ ; Constipation '[ ]'$ ; Diarrhea '[ ]'$ ; BRBPR '[ ]'$   GU: Hematuria'[ ]'$ ; Dysuria '[ ]'$ ; Nocturia'[ ]'$   Vascular: Pain in legs with walking '[ ]'$ ; Pain in feet with lying flat '[ ]'$ ; Non-healing sores '[ ]'$ ; Stroke '[ ]'$ ; TIA '[ ]'$ ; Slurred speech '[ ]'$ ;  Neuro: Headaches'[ ]'$ ; Vertigo'[ ]'$ ; Seizures'[ ]'$ ; Paresthesias'[ ]'$ ;Blurred vision '[ ]'$ ; Diplopia '[ ]'$ ; Vision  changes '[ ]'$   Ortho/Skin: Arthritis [y]; Joint pain [y]; Muscle pain '[ ]'$ ; Joint swelling '[ ]'$ ; Back Pain '[ ]'$ ; Rash '[ ]'$   Psych: Depression'[ ]'$ ; Anxiety'[ ]'$   Heme: Bleeding problems '[ ]'$ ; Clotting disorders '[ ]'$ ; Anemia '[ ]'$   Endocrine: Diabetes '[ ]'$ ; Thyroid dysfunction'[ ]'$     Home Medications Prior to Admission medications   Medication Sig Start Date End Date Taking? Authorizing Provider  acetaminophen (TYLENOL) 500 MG chewable tablet Chew 500 mg by mouth every 6 (six) hours as needed for pain.    [provider]  aspirin EC 325 MG tablet Take 325 mg by mouth daily.    [provider]  citalopram (CELEXA) 10 MG tablet Take 1 tablet (10 mg total) by mouth daily. 12/29/16   Clois Montavon, Shaune Pascal, MD  Coenzyme Q10 (COQ10) 200 MG CAPS Take 200 mg by mouth daily.    [provider]  furosemide (LASIX) 20 MG tablet Take 20 mg by mouth daily as needed (Take 20 mg as daily as needed for wt > 153 lbs). Reported on 01/14/2016    [provider]  hydrALAZINE (APRESOLINE) 50 MG tablet Take 1 tablet (50 mg total) by mouth 3 (three) times daily. 10/05/16   Fender Herder, Shaune Pascal, MD  levothyroxine (  SYNTHROID, LEVOTHROID) 125 MCG tablet Take 125-187.5 mcg by mouth daily before breakfast. Take 125 mcg (1 tablet) daily except 187.5 mcg (1 and 1/2 tablets) on Sunday    [provider]  pantoprazole (PROTONIX) 40 MG tablet TAKE ONE TABLET BY MOUTH ONCE DAILY 12/30/16   Larey Dresser, MD  potassium chloride SA (K-DUR,KLOR-CON) 20 MEQ tablet Take 1 tablet (20 mEq total) by mouth daily as needed. Take one potassium pill when you take your lasix. 01/02/15   Yesika Rispoli, Shaune Pascal, MD  Probiotic Product (PROBIOTIC PO) Take 1 capsule by mouth daily.    [provider]  ranitidine (ZANTAC) 150 MG tablet Take 150 mg by mouth daily as needed for heartburn.    [provider]  rosuvastatin (CRESTOR) 20 MG tablet Take 1 tablet (20 mg total) by mouth daily. 06/15/16   Larey Dresser,  MD  spironolactone (ALDACTONE) 25 MG tablet Take 1 tablet (25 mg total) by mouth daily. 09/29/16   Caeley Dohrmann, Shaune Pascal, MD  temazepam (RESTORIL) 15 MG capsule Take 1 capsule (15 mg total) by mouth at bedtime. 08/10/16   Larey Dresser, MD  warfarin (COUMADIN) 5 MG tablet Take 10 mg (2 tablets) on Wed and Sun, and take 7.5 mg (1.5 tablets) on all other days or as directed by VAD clinic. 11/19/16   Alif Petrak, Shaune Pascal, MD   Past Medical History: Past Medical History:  Diagnosis Date  . Anxiety   . Automatic implantable cardioverter-defibrillator in situ   . CAD (coronary artery disease)    S/P stenting of LCX in 2004;  s/p Lat MI 2009 with occlusion of the LCX - treated with Promus stenting. Has total occlusion of the RCA.   [name] CHF (congestive heart failure) ([place])   . Hypercholesteremia   . Hypertension   . Hypothyroidism   . ICD (implantable cardiac defibrillator) in place    PROPHYLACTIC      medtronic  . Inferior MI ([place]) "? date"; 2009  . Ischemic cardiomyopathy    a. 10/09 Echo: Sev LV Dysfxn, inf/lat AK, Mod MR.  [name] LVAD (left ventricular assist device) present ([place])   . Osteoarthritis    a. s/p R TKR  . Pacemaker   . PVC (premature ventricular contraction)   . RBBB   . Shortness of breath    Past Surgical History: Past Surgical History:  Procedure Laterality Date  . CARPAL TUNNEL RELEASE Right 05/29/2016   Procedure: CARPAL TUNNEL RELEASE;  Surgeon: Earlie Server, MD;  Location: [place];  Service: Orthopedics;  Laterality: Right;  . CORONARY ANGIOPLASTY WITH STENT PLACEMENT  2004  . CORONARY ANGIOPLASTY WITH STENT PLACEMENT  07/2008  . DEFIBRILLATOR  2009  . ESOPHAGOGASTRODUODENOSCOPY N/A 09/15/2013   Procedure: ESOPHAGOGASTRODUODENOSCOPY (EGD);  Surgeon: Ladene Artist, MD;  Location: [facility] ENDOSCOPY;  Service: Endoscopy;  Laterality: N/A;  bedside  . HEMATOMA EVACUATION Right 11/06/2013   Procedure: EVACUATION HEMATOMA;  Surgeon: Gaye Pollack, MD;  Location: [place];  Service:  Cardiothoracic;  Laterality: Right;  . INSERT / REPLACE / REMOVE PACEMAKER  2009  . INSERTION OF IMPLANTABLE LEFT VENTRICULAR ASSIST DEVICE N/A 09/18/2013   Procedure: INSERTION OF IMPLANTABLE LEFT VENTRICULAR ASSIST DEVICE;  Surgeon: Ivin Poot, MD;  Location: [place];  Service: Open Heart Surgery;  Laterality: N/A;  CIRC ARREST  NITRIC OXIDE  . INTRA-AORTIC BALLOON PUMP INSERTION N/A 09/14/2013   Procedure: INTRA-AORTIC BALLOON PUMP INSERTION;  Surgeon: Jolaine Artist, MD;  Location: [facility] CATH LAB;  Service:  Cardiovascular;  Laterality: N/A;  . INTRAOPERATIVE TRANSESOPHAGEAL ECHOCARDIOGRAM N/A 09/18/2013   Procedure: INTRAOPERATIVE TRANSESOPHAGEAL ECHOCARDIOGRAM;  Surgeon: Ivin Poot, MD;  Location: [place];  Service: Open Heart Surgery;  Laterality: N/A;  . KNEE ARTHROSCOPY     bilaterally  . KNEE ARTHROSCOPY W/ PARTIAL MEDIAL MENISCECTOMY  12/2002   left  . LEFT VENTRICULAR ASSIST DEVICE    . RIGHT HEART CATHETERIZATION N/A 08/25/2013   Procedure: RIGHT HEART CATH;  Surgeon: Jolaine Artist, MD;  Location: [facility] CATH LAB;  Service: Cardiovascular;  Laterality: N/A;  . RIGHT HEART CATHETERIZATION N/A 09/13/2013   Procedure: RIGHT HEART CATH;  Surgeon: Jolaine Artist, MD;  Location: [facility] CATH LAB;  Service: Cardiovascular;  Laterality: N/A;  . RIGHT HEART CATHETERIZATION N/A 02/08/2014   Procedure: RIGHT HEART CATH;  Surgeon: Jolaine Artist, MD;  Location: [facility] CATH LAB;  Service: Cardiovascular;  Laterality: N/A;  . RIGHT HEART CATHETERIZATION N/A 02/12/2014   Procedure: RIGHT HEART CATH;  Surgeon: Jolaine Artist, MD;  Location: [facility] CATH LAB;  Service: Cardiovascular;  Laterality: N/A;  . TOTAL KNEE ARTHROPLASTY  11/27/11   left  . TOTAL KNEE ARTHROPLASTY  11/27/2011   Procedure: TOTAL KNEE ARTHROPLASTY;  Surgeon: Yvette Rack., MD;  Location: [place];  Service: Orthopedics;  Laterality: Left;  left total knee arthroplasty  . VIDEO ASSISTED THORACOSCOPY Right 11/06/2013    Procedure: VIDEO ASSISTED THORACOSCOPY;  Surgeon: Gaye Pollack, MD;  Location[facility] OR;  Service: Cardiothoracic;  Laterality: Right;    Family History: Family History  Problem Relation Age of Onset  . Heart failure Father   . Stroke Father   . Coronary artery disease Mother   . Heart disease Mother   . Hyperlipidemia Sister     Social History: Social History   Social History  . Marital status: Married    Spouse name: N/A  . Number of children: 1  . Years of education: N/A   Occupational History  . referee Retired  . cleaning company    Social History Main Topics  . Smoking status: Never Smoker  . Smokeless tobacco: Never Used  . Alcohol use No  . Drug use: No     Comment: "back in our younger days we smoked a little pot"  . Sexual activity: Not Currently   Other Topics Concern  . Not on file   Social History Narrative  . No narrative on file    Allergies:  Allergies  Allergen Reactions  . Ace Inhibitors     Hypotension and elevated creatinine  . Diovan [Valsartan] Other (See Comments)    Hypotension at low dose, hyperkalemia  . Lipitor [Atorvastatin Calcium] Other (See Comments)    Muscle pain  . Norvasc [Amlodipine] Other (See Comments)    Put patient to sleep    Objective:    Vital Signs:   Weight 162 lbs  Mean arterial Pressure 75  Physical Exam: General:  Well appearing. No resp difficulty HEENT: Normal Neck: supple. JVP not elevated. Carotids 2+ bilat; no bruits. No lymphadenopathy or thyromegaly appreciated. Cor: Mechanical heart sounds with LVAD hum present. Lungs: Clear Abdomen: soft, nontender, nondistended. No hepatosplenomegaly. No bruits or masses. Good bowel sounds. Driveline: C/D/I; securement device intact and driveline incorporated Extremities: no cyanosis, clubbing, rash, edema Neuro: alert & orientedx3, cranial nerves grossly intact. moves all 4 extremities w/o difficulty. Affect pleasant  Telemetry: Not yet  connected  Labs: Basic Metabolic Panel: No results for input(s): NA, K, CL, CO2,  GLUCOSE, BUN, CREATININE, CALCIUM, MG, PHOS in the last 168 hours.  Liver Function Tests: No results for input(s): AST, ALT, ALKPHOS, BILITOT, PROT, ALBUMIN in the last 168 hours. No results for input(s): LIPASE, AMYLASE in the last 168 hours. No results for input(s): AMMONIA in the last 168 hours.  CBC: No results for input(s): WBC, NEUTROABS, HGB, HCT, MCV, PLT in the last 168 hours.  Cardiac Enzymes: No results for input(s): CKTOTAL, CKMB, CKMBINDEX, TROPONINI in the last 168 hours.  BNP: BNP (last 3 results)  Recent Labs  09/09/16 1008  BNP 202.2*    ProBNP (last 3 results) No results for input(s): PROBNP in the last 8760 hours.   CBG: No results for input(s): GLUCAP in the last 168 hours.  Coagulation Studies:  Recent Labs  01/19/17 1009  LABPROT 26.7*  INR 2.42    Other results: EKG: 01/12/17 NSR 87 with bigeminal PVCs  Imaging: No results found.    Assessment/Plan   1. Elevated LDH/suspected pump thrombosis - LDH up to 424 from baseline of close to 200. Repeated with hemolysis and is elevated at 438. - Start Bival. Per Dr. Prescott Gum, continue coumadin at this time.  - Check UA for bilirubinuria.  - VAD sounds stable.  2. Chronic systolic CHF due to ICM EF 20/25% s/p LVAD HM2 implant 08/2013 for DT - NYHA 1-early II symptoms - Echo 3/18 EF 10-15% with mild AI, mod RV dysfunction. Stable LVAD cannula - Volume status stable. Can use lasix prn - Has not tolerated ACE-I or amlodipine in the past - ICD at [facility]. Has decided not to replace 3. LVAD HM 2 placed 08/2013 for DT - LVAD parameters stable, but now with elevated LDH as above 4. Chronic anticoag - Recent INRs stable. INR goal 2.0 - 3.0. He has not been subtherapeutic.  - Continue ASA 325 mg.  5. Frequent PVCs - Will watch on tele.   LDH trending up over past few weeks. No signs or symptoms of HF at this time.   Admit for bilirubin and to trend LDH.   I reviewed the LVAD parameters from today, and compared the results to the patient's prior recorded data.  No programming changes were made.  The LVAD is functioning within specified parameters.  The patient performs LVAD self-test daily.  LVAD interrogation was negative for any significant power changes, alarms or PI events/speed drops.  LVAD equipment check completed and is in good working order.  Back-up equipment present.   LVAD education done on emergency procedures and precautions and reviewed exit site care.  Length of Stay: 0  Annamaria Helling 01/19/2017, 4:00 PM  VAD Team Pager [phone] (7am - 7am) +++VAD ISSUES ONLY+++   Advanced Heart Failure Team Pager [phone] (M-F; [place])  Please contact [facility] for night-coverage after hours (4p -7a ) and weekends on [email or web address] for all non- LVAD Issues  Patient seen and examined with the above-signed Advanced Practice Provider and/or Housestaff. I personally reviewed laboratory data, imaging studies and relevant notes. I independently examined the patient and formulated the important aspects of the plan. I have edited the note to reflect any of my changes or salient points. I have personally discussed the plan with the patient and/or family.  Patient admitted for rising LDH and suspected early pump thrombosis. No signs of clinical pump dysfunction or hemolysis. Will start bivalirudin. Trend LDH. Check echo. Discussed with PharmD.   Glori Bickers, MD  4:14 PM

## 2017-01-19 NOTE — Progress Notes (Signed)
ANTICOAGULATION CONSULT NOTE - Initial Consult  Pharmacy Consult for bivalirudin and warfarin Indication: LVAD with possible pump thrombosis  Allergies  Allergen Reactions  . Ace Inhibitors     Hypotension and elevated creatinine  . Diovan [Valsartan] Other (See Comments)    Hypotension at low dose, hyperkalemia  . Lipitor [Atorvastatin Calcium] Other (See Comments)    Muscle pain  . Norvasc [Amlodipine] Other (See Comments)    Put patient to sleep   Vital Signs:    Labs:  Recent Labs  01/19/17 1009  LABPROT 26.7*  INR 2.42    Estimated Creatinine Clearance: 44.8 mL/min (A) (by C-G formula based on SCr of 1.47 mg/dL (H)).   Medical History: Past Medical History:  Diagnosis Date  . Anxiety   . Automatic implantable cardioverter-defibrillator in situ   . CAD (coronary artery disease)    S/P stenting of LCX in 2004;  s/p Lat MI 2009 with occlusion of the LCX - treated with Promus stenting. Has total occlusion of the RCA.   [name] CHF (congestive heart failure) ([place])   . Hypercholesteremia   . Hypertension   . Hypothyroidism   . ICD (implantable cardiac defibrillator) in place    PROPHYLACTIC      medtronic  . Inferior MI ([place]) "? date"; 2009  . Ischemic cardiomyopathy    a. 10/09 Echo: Sev LV Dysfxn, inf/lat AK, Mod MR.  [name] LVAD (left ventricular assist device) present ([place])   . Osteoarthritis    a. s/p R TKR  . Pacemaker   . PVC (premature ventricular contraction)   . RBBB   . Shortness of breath     Medications:  Warfarin 7.5mg  daily except 10mg  on Sunday, Wednesday  Assessment: 76 year old male with HMII placed 09/19/13. Patient has been having upward trending LDH from 150 last year to now 430s. Patient to be admitted for possible pump thrombosis.  Discussed with cardiology and surgery, will start bivalirudin but will continue warfarin for now. INR in clinic this morning was at goal at 2.4. CBC, CMET pending.  Bivalirudin can increase INR so will watch closely  and recheck with first aptt.  Goal of Therapy:  Aptt goal 50-85s INR 2-3 Monitor platelets by anticoagulation protocol: Yes   Plan:  Start bivalirudin at 0.1mg /kg/hr Aptt 2 hours after infusion started Continue with warfarin 7.5mg  tonight Daily INR, aptt, CBC, LDH  Erin Hearing PharmD., BCPS Clinical Pharmacist Pager [phone] 01/19/2017 3:38 PM

## 2017-01-19 NOTE — Progress Notes (Addendum)
Received critical INR 4.4. Notified on call cardiology. Awaiting return call. No new orders at this time. Monitor for bleeding

## 2017-01-19 NOTE — Progress Notes (Signed)
CRITICAL VALUE ALERT  Critical Value: INR 4.4  Date & Time Notied: 1955. 01/19/17  Provider Notified: Cardiology on call  Orders Received/Actions taken: no new orders at this time. Watch for bleeding. Will continue to monitor

## 2017-01-19 NOTE — Progress Notes (Signed)
[place] for bivalirudin Indication: LVAD with possible pump thrombosis  Allergies  Allergen Reactions  . Ace Inhibitors     Hypotension and elevated creatinine  . Diovan [Valsartan] Other (See Comments)    Hypotension at low dose, hyperkalemia  . Lipitor [Atorvastatin Calcium] Other (See Comments)    Muscle pain  . Norvasc [Amlodipine] Other (See Comments)    Put patient to sleep   Vital Signs: Temp: 98.3 F (36.8 C) (05/22 1947) Temp Source: Oral (05/22 1947) BP: 112/94 (05/22 2000) Pulse Rate: 85 (05/22 2022)  Labs:  Recent Labs  01/19/17 1009 01/19/17 1533 01/19/17 1838 01/19/17 2135  HGB  --  13.7  --   --   HCT  --  42.5  --   --   PLT  --  117*  --   --   APTT  --   --  99* 97*  LABPROT 26.7*  --  42.5*  --   INR 2.42  --  4.44*  --   CREATININE  --  1.35*  --   --     Estimated Creatinine Clearance: 48.7 mL/min (A) (by C-G formula based on SCr of 1.35 mg/dL (H)).   Medical History: Past Medical History:  Diagnosis Date  . Anxiety   . Automatic implantable cardioverter-defibrillator in situ   . CAD (coronary artery disease)    S/P stenting of LCX in 2004;  s/p Lat MI 2009 with occlusion of the LCX - treated with Promus stenting. Has total occlusion of the RCA.   [name] CHF (congestive heart failure) ([place])   . Hypercholesteremia   . Hypertension   . Hypothyroidism   . ICD (implantable cardiac defibrillator) in place    PROPHYLACTIC      medtronic  . Inferior MI ([place]) "? date"; 2009  . Ischemic cardiomyopathy    a. 10/09 Echo: Sev LV Dysfxn, inf/lat AK, Mod MR.  [name] LVAD (left ventricular assist device) present ([place])   . Osteoarthritis    a. s/p R TKR  . Pacemaker   . PVC (premature ventricular contraction)   . RBBB   . Shortness of breath     Medications:  Warfarin 7.5mg  daily except 10mg  on Sunday, Wednesday  Assessment: 76 year old male with HMII placed 09/19/13. Patient has been having upward trending LDH  from 150 last year to now 430s. Patient to be admitted for possible pump thrombosis.  Discussed with cardiology and surgery, will start bivalirudin but will continue warfarin for now. INR in clinic this morning was at goal at 2.4.   Repeat aPTT still above goal at 97  Goal of Therapy:  Aptt goal 50-85s INR 2-3 Monitor platelets by anticoagulation protocol: Yes   Plan:  Decrease bivalirudin to 0.06 mg/kg/hr. Recheck aptt in 2 hrs. Daily INR, aptt, CBC, LDH  Uvaldo Rising, BCPS  Clinical Pharmacist Pager [phone]  01/19/2017 10:32 PM

## 2017-01-19 NOTE — Progress Notes (Signed)
Patient and wife informed of increased LDH. Plans made to admit fr treatment. Will admit to [place] RN, [place] Coordinator 24/7 pager [phone]

## 2017-01-19 NOTE — Addendum Note (Signed)
Encounter addended by: Scarlette Calico, RN on: 01/19/2017  2:19 PM<BR>    Actions taken: Visit diagnoses modified, Order list changed, Diagnosis association updated

## 2017-01-19 NOTE — Progress Notes (Signed)
  Subjective: Patient examined, pump parameters and laboratory data reviewed Persistent rise in LDH from 230 >> 340>> 430 Review of INR values show therapeutic range for the past 3 months Patient asymptomatic Hemoglobin has been stable Agree with plan for 72 hours of IV bivalirudin and continue usual dose of Coumadin  Objective: Vital signs in last 24 hours: Cardiac Rhythm: Normal sinus rhythm;Bundle branch block;Heart block;Atrial paced (05/22 1531)  Hemodynamic parameters for last 24 hours:  stable  Intake/Output from previous day: No intake/output data recorded. Intake/Output this shift: No intake/output data recorded.  Normal VAD hum No edema Neuro intact No JVD  Lab Results:  Recent Labs  01/19/17 1533  WBC 7.1  HGB 13.7  HCT 42.5  PLT 117*   BMET:  Recent Labs  01/19/17 1533  NA 138  K 4.1  CL 109  CO2 20*  GLUCOSE 106*  BUN 18  CREATININE 1.35*  CALCIUM 8.5*    PT/INR:  Recent Labs  01/19/17 1009  LABPROT 26.7*  INR 2.42   ABG    Component Value Date/Time   PHART 7.414 02/12/2014 0949   HCO3 24.1 (H) 02/12/2014 0949   TCO2 25 02/12/2014 0949   ACIDBASEDEF 1.0 11/07/2013 1138   O2SAT 69.0 02/12/2014 0949   CBG (last 3)  No results for input(s): GLUCAP in the last 72 hours.  Assessment/Plan: S/P  IV  bivalirudin and close observation   LOS: 0 days    Johnny Oneill 01/19/2017

## 2017-01-20 LAB — PROTIME-INR
INR: 4.18
Prothrombin Time: 41.3 seconds — ABNORMAL HIGH (ref 11.4–15.2)

## 2017-01-20 LAB — CBC
HCT: 42.5 % (ref 39.0–52.0)
Hemoglobin: 13.9 g/dL (ref 13.0–17.0)
MCH: 29.8 pg (ref 26.0–34.0)
MCHC: 32.7 g/dL (ref 30.0–36.0)
MCV: 91 fL (ref 78.0–100.0)
Platelets: 103 10*3/uL — ABNORMAL LOW (ref 150–400)
RBC: 4.67 MIL/uL (ref 4.22–5.81)
RDW: 16.1 % — ABNORMAL HIGH (ref 11.5–15.5)
WBC: 5.1 10*3/uL (ref 4.0–10.5)

## 2017-01-20 LAB — LACTATE DEHYDROGENASE: LDH: 237 U/L — ABNORMAL HIGH (ref 98–192)

## 2017-01-20 LAB — BASIC METABOLIC PANEL
Anion gap: 7 (ref 5–15)
BUN: 15 mg/dL (ref 6–20)
CO2: 26 mmol/L (ref 22–32)
Calcium: 8.9 mg/dL (ref 8.9–10.3)
Chloride: 106 mmol/L (ref 101–111)
Creatinine, Ser: 1.42 mg/dL — ABNORMAL HIGH (ref 0.61–1.24)
GFR calc Af Amer: 55 mL/min — ABNORMAL LOW (ref >60)
GFR calc non Af Amer: 47 mL/min — ABNORMAL LOW (ref >60)
Glucose, Bld: 106 mg/dL — ABNORMAL HIGH (ref 65–99)
Potassium: 4.5 mmol/L (ref 3.5–5.1)
Sodium: 139 mmol/L (ref 135–145)

## 2017-01-20 LAB — APTT
aPTT: 68 seconds — ABNORMAL HIGH (ref 24–36)
aPTT: 84 seconds — ABNORMAL HIGH (ref 24–36)
aPTT: 84 seconds — ABNORMAL HIGH (ref 24–36)
aPTT: 91 seconds — ABNORMAL HIGH (ref 24–36)

## 2017-01-20 MED ORDER — SODIUM CHLORIDE 0.9 % IV SOLN
0.0450 mg/kg/h | INTRAVENOUS | Status: DC
  Filled 2017-01-20 (×2): qty 250

## 2017-01-20 MED ORDER — WARFARIN SODIUM 4 MG PO TABS: 4.0000 mg | ORAL_TABLET | Freq: Once | ORAL | Status: DC

## 2017-01-20 MED ORDER — WARFARIN SODIUM 7.5 MG PO TABS
7.5000 mg | ORAL_TABLET | Freq: Once | ORAL | Status: AC
  Administered 2017-01-20: 7.5 mg via ORAL
  Filled 2017-01-20: qty 1

## 2017-01-20 NOTE — Progress Notes (Signed)
HeartMate 2 Rounding Note  Subjective:    Admitted with elevated LDH. Started on bival.   Denies SOB. No complaints.   LDH 438>237  LVAD INTERROGATION:  HeartMate II LVAD:  Flow 3.4 liters/min, speed 9190, power 5, PI 6.5 Rare PI events.   Objective:    Vital Signs:   Temp:  [97.8 F (36.6 C)-98.3 F (36.8 C)] 97.8 F (36.6 C) (05/23 0529) Pulse Rate:  [40-85] 64 (05/23 0529) Resp:  [13-18] 18 (05/23 0529) BP: (108-112)/(90-94) 108/90 (05/23 0529) SpO2:  [97 %-98 %] 97 % (05/23 0529) Weight:  [159 lb 14.4 oz (72.5 kg)] 159 lb 14.4 oz (72.5 kg) (05/23 0528) Last BM Date: 01/19/17 Mean arterial Pressure  90-100s  Intake/Output:   Intake/Output Summary (Last 24 hours) at 01/20/17 0752 Last data filed at 01/20/17 0546  Gross per 24 hour  Intake           350.82 ml  Output             1475 ml  Net         -1124.18 ml     Physical Exam: General:  Well appearing. No resp difficulty. In bed.  HEENT: normal Neck: supple. JVP 5-6  Carotids 2+ bilat; no bruits. No lymphadenopathy or thryomegaly appreciated. Cor: Mechanical heart sounds with LVAD hum present. Lungs: clear Abdomen: soft, nontender, nondistended. No hepatosplenomegaly. No bruits or masses. Good bowel sounds. Driveline: C/D/I; securement device intact and driveline incorporated Extremities: no cyanosis, clubbing, rash, edema Neuro: alert & orientedx3, cranial nerves grossly intact. moves all 4 extremities w/o difficulty. Affect pleasant  Telemetry: A Sensed  60-80s  Labs: Basic Metabolic Panel:  Recent Labs Lab 01/19/17 1533 01/20/17 0025  NA 138 139  K 4.1 4.5  CL 109 106  CO2 20* 26  GLUCOSE 106* 106*  BUN 18 15  CREATININE 1.35* 1.42*  CALCIUM 8.5* 8.9    Liver Function Tests:  Recent Labs Lab 01/19/17 1533  AST 26  ALT 19  ALKPHOS 78  BILITOT 0.6  PROT 6.2*  ALBUMIN 4.0   No results for input(s): LIPASE, AMYLASE in the last 168 hours. No results for input(s): AMMONIA in  the last 168 hours.  CBC:  Recent Labs Lab 01/19/17 1533 01/20/17 0025  WBC 7.1 5.1  NEUTROABS 5.3  --   HGB 13.7 13.9  HCT 42.5 42.5  MCV 91.8 91.0  PLT 117* 103*    INR:  Recent Labs Lab 01/15/17 01/19/17 1009 01/19/17 1838 01/20/17 0025  INR 2.6 2.42 4.44* 4.18*    Other results:  EKG:   Imaging:  No results found.   Medications:     Scheduled Medications: . aspirin EC  325 mg Oral Daily  . citalopram  10 mg Oral Daily  . famotidine  20 mg Oral Daily  . hydrALAZINE  50 mg Oral TID  . lactobacillus acidophilus  1 tablet Oral Daily  . levothyroxine  125 mcg Oral Once per day on Mon Tue Wed Thu Fri Sat  . [START ON 01/24/2017] levothyroxine  187.5 mcg Oral Once per day on Sun  . pantoprazole  40 mg Oral Daily  . rosuvastatin  20 mg Oral Daily  . spironolactone  25 mg Oral Daily  . temazepam  15 mg Oral QHS  . Warfarin - Pharmacist Dosing Inpatient   Does not apply q1800     Infusions: . bivalirudin (ANGIOMAX) infusion 0.5 mg/mL (Non-ACS indications) 0.05 mg/kg/hr (01/20/17 0112)  PRN Medications:  acetaminophen, ondansetron (ZOFRAN) IV   Assessment:  1. LVAD--> Elevated LDH Possible Pump Thrombosis 2. Chronic Systolic Heart Failure  3. Chronic Anticoagulation.    Plan/Discussion:    Admitted with elevated LDH.Started on bival. LDH trending down 438>237. Continue bival until plateaus.   INR on Bival 4.18. Discussed coumadin and bival with Pharmacy.   NO HF symptoms. Stable.   I reviewed the LVAD parameters from today, and compared the results to the patient's prior recorded data.  No programming changes were made.  The LVAD is functioning within specified parameters.  The patient performs LVAD self-test daily.  LVAD interrogation was negative for any significant power changes, alarms or PI events/speed drops.  LVAD equipment check completed and is in good working order.  Back-up equipment present.   LVAD education done on emergency  procedures and precautions and reviewed exit site care.  Length of Stay: 1  Amy Clegg NP-C  01/20/2017, 7:52 AM  VAD Team --- VAD ISSUES ONLY--- Pager [phone] (7am - 7am) Advanced Heart Failure Team  Pager [phone] (M-F; 7a - 4p)Please contact [facility] for night-coverage after hours (4p -7a ) and weekends on [email or web address]  Patient seen and examined with Darrick Grinder, NP. We discussed all aspects of the encounter. I agree with the assessment and plan as stated above.   He is stable. LDH improved on bivalirudin. No bleeding. Will continue bival for 24-48 more hours. VAD parameters stable.   Glori Bickers, MD  2:11 PM

## 2017-01-20 NOTE — Progress Notes (Signed)
Admitted 01/19/2017 due to elevated LDH, possible pump thrombosis.   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 Prescott Gum.   Vital signs: HR: 64 Doppler Pressure:88 Automatic BP: 108/90 (97) O2 Sat: 97 on RA Wt:159 lbs     LVAD interrogation reveals:  Speed:9190 Flow: 3.4 Power:  5 PI:6.5  Alarms: none Events:  none Fixed speed: 9200 Low speed limit: 8600  Drive Line: unremarkable. [name], wife, changes weekly.  Labs:  LDH trend:438>237  INR trend: 2.42>4.18  Anticoagulation Plan: -INR Goal: 2-3 -ASA Dose: 325 mg  Adverse Events on VAD: -none  Plan/Recommendations:   1. Patient has an allergy to CHG wipes. If [name] unable to change weekly dressing, use sterile saline wipes only. 2. Continue Bival.  Balinda Quails RN, [place] Coordinator 24/7 pager [phone]

## 2017-01-20 NOTE — Progress Notes (Signed)
Received critical INR 4.18. Continue to monitor for bleeding

## 2017-01-20 NOTE — Progress Notes (Addendum)
[facility] for bivalirudin Indication: LVAD with possible pump thrombosis  Allergies  Allergen Reactions  . Ace Inhibitors     Hypotension and elevated creatinine  . Diovan [Valsartan] Other (See Comments)    Hypotension at low dose, hyperkalemia  . Lipitor [Atorvastatin Calcium] Other (See Comments)    Muscle pain  . Norvasc [Amlodipine] Other (See Comments)    Put patient to sleep   Vital Signs: Temp: 97.8 F (36.6 C) (05/23 0529) Temp Source: Oral (05/23 0529) BP: 108/90 (05/23 0529) Pulse Rate: 64 (05/23 0529)  Labs:  Recent Labs  01/19/17 1009 01/19/17 1533 01/19/17 1838  01/20/17 0025 01/20/17 0337 01/20/17 0641  HGB  --  13.7  --   --  13.9  --   --   HCT  --  42.5  --   --  42.5  --   --   PLT  --  117*  --   --  103*  --   --   APTT  --   --  99*  < > 91* 84* 84*  LABPROT 26.7*  --  42.5*  --  41.3*  --   --   INR 2.42  --  4.44*  --  4.18*  --   --   CREATININE  --  1.35*  --   --  1.42*  --   --   < > = values in this interval not displayed.  Estimated Creatinine Clearance: 43.3 mL/min (A) (by C-G formula based on SCr of 1.42 mg/dL (H)).   Medical History: Past Medical History:  Diagnosis Date  . Anxiety   . Automatic implantable cardioverter-defibrillator in situ   . CAD (coronary artery disease)    S/P stenting of LCX in 2004;  s/p Lat MI 2009 with occlusion of the LCX - treated with Promus stenting. Has total occlusion of the RCA.   [name] CHF (congestive heart failure) ([place])   . Hypercholesteremia   . Hypertension   . Hypothyroidism   . ICD (implantable cardiac defibrillator) in place    PROPHYLACTIC      medtronic  . Inferior MI ([place]) "? date"; 2009  . Ischemic cardiomyopathy    a. 10/09 Echo: Sev LV Dysfxn, inf/lat AK, Mod MR.  [name] LVAD (left ventricular assist device) present ([place])   . Osteoarthritis    a. s/p R TKR  . Pacemaker   . PVC (premature ventricular contraction)   . RBBB   . Shortness of breath      Medications:  Warfarin 7.5mg  daily except 10mg  on Sunday, Wednesday  Assessment: 76 year old male with HMII placed 09/19/13. Patient has been having upward trending LDH from 150 last year to now 430s. Patient to be admitted for possible pump thrombosis and anticoagulation.  Aptt this am is at upper end of goal (84), INR has increased to 4.1 on home dose, will back down on dosing for tonight.   LDH trending down to 237, cbc stable without signs of symptoms of bleeding.  Goal of Therapy:  Aptt goal 50-85s INR 2-3 Monitor platelets by anticoagulation protocol: Yes   Plan:  Decrease bivalirudin to 0.04 mg/kg/hr. Recheck aptt in 4 hrs. Daily INR, aptt, CBC, LDH  Erin Hearing PharmD., BCPS Clinical Pharmacist Pager [phone] 01/20/2017 8:22 AM  Addendum: 01/20/2017 2:01 PM Afternoon aptt 68, would like to keep in 70s range, will make small rate adjustment. Bival seems to having a significant effect in INR, based on changes  yesterday expect true INR to likely be 2-2.5, will give slightly lower dose tonight compared to home dose. He also continues on full dose aspirin.  No bleeding issues, pt ambulating around halls  Plan Increase bival to 0.045mg /kg/hr Warfarin 7.5mg  tonight

## 2017-01-20 NOTE — Progress Notes (Addendum)
ANTICOAGULATION CONSULT NOTE - Follow Up Consult  Pharmacy Consult for bivalirudin Indication: LVAD with possible pump thrombosis  Labs:  Recent Labs  01/19/17 1009 01/19/17 1533  01/19/17 1838 01/19/17 2135 01/20/17 0025 01/20/17 0337  HGB  --  13.7  --   --   --  13.9  --   HCT  --  42.5  --   --   --  42.5  --   PLT  --  117*  --   --   --  103*  --   APTT  --   --   < > 99* 97* 91* 84*  LABPROT 26.7*  --   --  42.5*  --  41.3*  --   INR 2.42  --   --  4.44*  --  4.18*  --   CREATININE  --  1.35*  --   --   --  1.42*  --   < > = values in this interval not displayed.   Assessment: 76yo male remains above goal on bivalirudin after rate change though closer to goal.  Goal of Therapy:  aPTT 50-85 seconds   Plan:  Will decrease bivalirudin gtt further to 0.05 mg/kg/hr and check PTT in 2hr.  Wynona Neat, PharmD, BCPS  01/20/2017,1:03 AM   ADDENDUM: PTT now at goal.  Will confirm stable with additional PTT. VB    5:06 AM

## 2017-01-21 LAB — PROTIME-INR
INR: 3.29
INR: 2.89
Prothrombin Time: 34.2 seconds — ABNORMAL HIGH (ref 11.4–15.2)
Prothrombin Time: 30.8 seconds — ABNORMAL HIGH (ref 11.4–15.2)

## 2017-01-21 LAB — CBC
HCT: 43.8 % (ref 39.0–52.0)
Hemoglobin: 14.2 g/dL (ref 13.0–17.0)
MCH: 29.6 pg (ref 26.0–34.0)
MCHC: 32.4 g/dL (ref 30.0–36.0)
MCV: 91.4 fL (ref 78.0–100.0)
Platelets: 114 10*3/uL — ABNORMAL LOW (ref 150–400)
RBC: 4.79 MIL/uL (ref 4.22–5.81)
RDW: 16 % — ABNORMAL HIGH (ref 11.5–15.5)
WBC: 5.8 10*3/uL (ref 4.0–10.5)

## 2017-01-21 LAB — BASIC METABOLIC PANEL
Anion gap: 7 (ref 5–15)
BUN: 18 mg/dL (ref 6–20)
CO2: 26 mmol/L (ref 22–32)
Calcium: 8.9 mg/dL (ref 8.9–10.3)
Chloride: 104 mmol/L (ref 101–111)
Creatinine, Ser: 1.37 mg/dL — ABNORMAL HIGH (ref 0.61–1.24)
GFR calc Af Amer: 57 mL/min — ABNORMAL LOW (ref >60)
GFR calc non Af Amer: 50 mL/min — ABNORMAL LOW (ref >60)
Glucose, Bld: 101 mg/dL — ABNORMAL HIGH (ref 65–99)
Potassium: 4.1 mmol/L (ref 3.5–5.1)
Sodium: 137 mmol/L (ref 135–145)

## 2017-01-21 LAB — APTT
aPTT: 71 seconds — ABNORMAL HIGH (ref 24–36)
aPTT: 70 seconds — ABNORMAL HIGH (ref 24–36)

## 2017-01-21 LAB — LACTATE DEHYDROGENASE: LDH: 190 U/L (ref 98–192)

## 2017-01-21 MED ORDER — WARFARIN SODIUM 10 MG PO TABS
10.0000 mg | ORAL_TABLET | Freq: Once | ORAL | Status: AC
  Administered 2017-01-21: 10 mg via ORAL
  Filled 2017-01-21: qty 1

## 2017-01-21 NOTE — Progress Notes (Signed)
Admitted 01/19/2017 due to elevated LDH, possible pump thrombosis.   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 Prescott Gum.   Vital signs: HR:81 Doppler Pressure:92 Automatic BP: 114/91 (100) O2 Sat: 97 on RA Wt:159 lbs     LVAD interrogation reveals:  Speed:9200 Flow: 3.7 Power:  5 PI: 7.4  Alarms: none Events:  none Fixed speed: 9200 Low speed limit: 8600  Drive Line: unremarkable. [name], wife, changes weekly.  Labs:  LDH trend:438>237>190  INR trend: 2.42>4.18>3.29>2.89  Anticoagulation Plan: -INR Goal: 2-3 -ASA Dose: 325 mg  Adverse Events on VAD: -none  Plan/Recommendations:   1.Possibly home today or tomorrow.  Balinda Quails RN, VAD Coordinator 24/7 pager [phone]

## 2017-01-21 NOTE — Discharge Summary (Signed)
Advanced Heart Failure Team  Discharge Summary   Patient ID: Johnny Oneill MRN: [id number], DOB/AGE: 1940-11-05 76 y.o. Admit date: 01/19/2017 D/C date:     01/22/2017   Primary Discharge Diagnoses:  1. LVAD--> Elevated LDH Possible Pump Thrombosis 2. Chronic Systolic Heart Failure  3. Chronic Anticoagulation.   Hospital Course:  Johnny Oneill is a 76 y.o. 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   Admitted with elevated LDH and suspected pump thrombosis. Over the last few months LDH trending up. On admit LDH was 424 without symptoms of pump thrombosis. Started on Bival with excellent response. LDH came down to 188. UA negative for bilirubin.   Coumadin not held this episode, so INR checked 4 hours after stopping Bival on am of discharge.  Stable INR at 2.1 so pt deemed stable for discharge.   HF stable and he continued on home HF regimen. Plan to see back next week to check INR and LDH. He will continue to be followed closely in the LVAD/HF clinic.   LVAD Interrogation HM II: Speed: 9200 Flow: 3.5 PI: 7.1 Power: 5 Back-up speed: 8600  Discharge Weight:  159 lbs Discharge Vitals: Blood pressure 102/84, pulse 73, temperature 98 F (36.7 C), temperature source Oral, resp. rate 18, height 5\' 7"  (1.702 m), weight 159 lb 4.8 oz (72.3 kg), SpO2 99 %.  Labs: Lab Results  Component Value Date   WBC 6.0 01/22/2017   HGB 14.2 01/22/2017   HCT 43.8 01/22/2017   MCV 90.7 01/22/2017   PLT 119 (L) 01/22/2017    Recent Labs Lab 01/19/17 1533  01/22/17 0237  NA 138  < > 138  K 4.1  < > 4.1  CL 109  < > 104  CO2 20*  < > 24  BUN 18  < > 19  CREATININE 1.35*  < > 1.46*  CALCIUM 8.5*  < > 9.2  PROT 6.2*  --   --   BILITOT 0.6  --   --   ALKPHOS 78  --   --   ALT 19  --   --   AST 26  --   --   GLUCOSE 106*  < > 100*  < > = values in this interval not displayed. Lab Results  Component Value Date   CHOL 93 05/28/2015   HDL  41.50 05/28/2015   LDLCALC 29 05/28/2015   TRIG 112.0 05/28/2015   BNP (last 3 results)  Recent Labs  09/09/16 1008  BNP 202.2*   ProBNP (last 3 results) No results for input(s): PROBNP in the last 8760 hours.  Diagnostic Studies/Procedures   No results found.  Discharge Medications   Allergies as of 01/22/2017      Reactions   Ace Inhibitors    Hypotension and elevated creatinine   Diovan [valsartan] Other (See Comments)   Hypotension at low dose, hyperkalemia   Lipitor [atorvastatin Calcium] Other (See Comments)   Muscle pain   Norvasc [amlodipine] Other (See Comments)   Put patient to sleep      Medication List    TAKE these medications   acetaminophen 500 MG chewable tablet Commonly known as:  TYLENOL Chew 500 mg by mouth every 6 (six) hours as needed for pain.   aspirin EC 325 MG tablet Take 325 mg by mouth daily.   citalopram 10 MG tablet Commonly known as:  CELEXA Take 1 tablet (10 mg total) by  mouth daily.   CoQ10 200 MG Caps Take 200 mg by mouth daily.   furosemide 20 MG tablet Commonly known as:  LASIX Take 20 mg by mouth daily as needed (Take 20 mg as daily as needed for wt > 153 lbs). Reported on 01/14/2016   hydrALAZINE 50 MG tablet Commonly known as:  APRESOLINE Take 1 tablet (50 mg total) by mouth 3 (three) times daily.   levothyroxine 125 MCG tablet Commonly known as:  SYNTHROID, LEVOTHROID Take 125-187.5 mcg by mouth daily before breakfast. Take 125 mcg (1 tablet) daily except 187.5 mcg (1 and 1/2 tablets) on Sunday   Melatonin 3 MG Tabs Take 1 tablet by mouth at bedtime.   pantoprazole 40 MG tablet Commonly known as:  PROTONIX TAKE ONE TABLET BY MOUTH ONCE DAILY   potassium chloride SA 20 MEQ tablet Commonly known as:  K-DUR,KLOR-CON Take 1 tablet (20 mEq total) by mouth daily as needed. Take one potassium pill when you take your lasix.   PROBIOTIC PO Take 1 capsule by mouth daily.   ranitidine 150 MG tablet Commonly known  as:  ZANTAC Take 150 mg by mouth daily as needed for heartburn.   rosuvastatin 20 MG tablet Commonly known as:  CRESTOR Take 1 tablet (20 mg total) by mouth daily.   spironolactone 25 MG tablet Commonly known as:  ALDACTONE Take 1 tablet (25 mg total) by mouth daily.   temazepam 15 MG capsule Commonly known as:  RESTORIL Take 1 capsule (15 mg total) by mouth at bedtime. What changed:  when to take this  reasons to take this   warfarin 5 MG tablet Commonly known as:  COUMADIN Take 10 mg (2 tablets) on Wed and Sun, and take 7.5 mg (1.5 tablets) on all other days or as directed by VAD clinic.      Disposition   The patient will be discharged in stable condition to home.  Discharge Instructions    (HEART FAILURE PATIENTS) Call MD:  Anytime you have any of the following symptoms: 1) 3 pound weight gain in 24 hours or 5 pounds in 1 week 2) shortness of breath, with or without a dry hacking cough 3) swelling in the hands, feet or stomach 4) if you have to sleep on extra pillows at night in order to breathe.    Complete by:  As directed    Diet - low sodium heart healthy    Complete by:  As directed    Diet - low sodium heart healthy    Complete by:  As directed    Heart Failure patients record your daily weight using the same scale at the same time of day    Complete by:  As directed    Increase activity slowly    Complete by:  As directed    Increase activity slowly    Complete by:  As directed      Follow-up Information    Ladarrell Cornwall, Shaune Pascal, MD Follow up on 01/27/2017.   Specialty:  Cardiology Why:  at 1100. Garage Code [id number]  Contact information: [address] [place] [address] [phone]            Duration of Discharge Encounter: Greater than 35 minutes   Signed, Annamaria Helling  01/22/2017, 2:04 PM   Patient seen and examined with the above-signed Advanced Practice Provider and/or Housestaff. I personally reviewed  laboratory data, imaging studies and relevant notes. I independently examined the patient and formulated  the important aspects of the plan. I have edited the note to reflect any of my changes or salient points. I have personally discussed the plan with the patient and/or family.  LDH improved rapidly with bival. D/c home today. Follow LDH in clinic.   Glori Bickers, MD  9:23 PM

## 2017-01-21 NOTE — Progress Notes (Signed)
HeartMate 2 Rounding Note  Subjective:    Admitted with elevated LDH. Started on bival.   Remains on Bival. LDH trending down 438>237> 190    Denies SOB. Walking in the hall without difficulty.   LVAD INTERROGATION:  HeartMate II LVAD:  Flow 3.8 lliters/min, speed 9200, power 5, PI 7.3  12 PI events over night.    Objective:    Vital Signs:   Temp:  [97.5 F (36.4 C)-98.2 F (36.8 C)] 97.5 F (36.4 C) (05/24 0526) Pulse Rate:  [72-81] 72 (05/24 0526) Resp:  [18] 18 (05/24 0526) BP: (114)/(91) 114/91 (05/23 2031) SpO2:  [98 %-99 %] 98 % (05/24 0526) Weight:  [159 lb 11.2 oz (72.4 kg)] 159 lb 11.2 oz (72.4 kg) (05/24 0529) Last BM Date: 01/20/17 Mean arterial Pressure  90-100s  Intake/Output:   Intake/Output Summary (Last 24 hours) at 01/21/17 0709 Last data filed at 01/21/17 0304  Gross per 24 hour  Intake           946.24 ml  Output              450 ml  Net           496.24 ml     Physical Exam: GENERAL: Well appearing, male who presents to clinic today in no acute distress. HEENT: normal  NECK: Supple, JVP 5-6 .  2+ bilaterally, no bruits.  No lymphadenopathy or thyromegaly appreciated.   CARDIAC:  Mechanical heart sounds with LVAD hum present.  LUNGS:  Clear to auscultation bilaterally.  ABDOMEN:  Soft, round, nontender, positive bowel sounds x4.     LVAD exit site: well-healed and incorporated.  Dressing dry and intact.  No erythema or drainage.  Stabilization device present and accurately applied.  Driveline dressing is being changed daily per sterile technique. EXTREMITIES:  Warm and dry, no cyanosis, clubbing, rash or edema  NEUROLOGIC:  Alert and oriented x 4.  Gait steady.  No aphasia.  No dysarthria.  Affect pleasant.     Telemetry: A sensed V paced. 80s  Labs: Basic Metabolic Panel:  Recent Labs Lab 01/19/17 1533 01/20/17 0025 01/21/17 0308  NA 138 139 137  K 4.1 4.5 4.1  CL 109 106 104  CO2 20* 26 26  GLUCOSE 106* 106* 101*  BUN 18  15 18   CREATININE 1.35* 1.42* 1.37*  CALCIUM 8.5* 8.9 8.9    Liver Function Tests:  Recent Labs Lab 01/19/17 1533  AST 26  ALT 19  ALKPHOS 78  BILITOT 0.6  PROT 6.2*  ALBUMIN 4.0   No results for input(s): LIPASE, AMYLASE in the last 168 hours. No results for input(s): AMMONIA in the last 168 hours.  CBC:  Recent Labs Lab 01/19/17 1533 01/20/17 0025 01/21/17 0308  WBC 7.1 5.1 5.8  NEUTROABS 5.3  --   --   HGB 13.7 13.9 14.2  HCT 42.5 42.5 43.8  MCV 91.8 91.0 91.4  PLT 117* 103* 114*    INR:  Recent Labs Lab 01/15/17 01/19/17 1009 01/19/17 1838 01/20/17 0025 01/21/17 0308  INR 2.6 2.42 4.44* 4.18* 3.29    Other results:  EKG:   Imaging: No results found.   Medications:     Scheduled Medications: . aspirin EC  325 mg Oral Daily  . citalopram  10 mg Oral Daily  . famotidine  20 mg Oral Daily  . hydrALAZINE  50 mg Oral TID  . lactobacillus acidophilus  1 tablet Oral Daily  . levothyroxine  125 mcg Oral Once per day on Mon Tue Wed Thu Fri Sat  . [START ON 01/24/2017] levothyroxine  187.5 mcg Oral Once per day on Sun  . pantoprazole  40 mg Oral Daily  . rosuvastatin  20 mg Oral Daily  . spironolactone  25 mg Oral Daily  . temazepam  15 mg Oral QHS  . Warfarin - Pharmacist Dosing Inpatient   Does not apply q1800    Infusions: . bivalirudin (ANGIOMAX) infusion 0.5 mg/mL (Non-ACS indications) 0.04 mg/kg/hr (01/20/17 0845)    PRN Medications: acetaminophen, ondansetron (ZOFRAN) IV   Assessment:  1. LVAD--> Elevated LDH Possible Pump Thrombosis 2. Chronic Systolic Heart Failure  3. Chronic Anticoagulation.    Plan/Discussion:    Admitted with elevated LDH.  Remains on Bival LDH continues to fall. [phone]. Continue bival until plateaus.   INR 3.29 on bival.  Discussed coumadin and bival with Pharmacy.   NO HF symptoms. Stable.   I reviewed the LVAD parameters from today, and compared the results to the patient's prior recorded  data.  No programming changes were made.  The LVAD is functioning within specified parameters.  The patient performs LVAD self-test daily.  LVAD interrogation was negative for any significant power changes, alarms or PI events/speed drops.  LVAD equipment check completed and is in good working order.  Back-up equipment present.   LVAD education done on emergency procedures and precautions and reviewed exit site care.   Hopefully home in am or later today.  Length of Stay: 2  Amy Clegg NP-C  01/21/2017, 7:09 AM  VAD Team --- VAD ISSUES ONLY--- Pager [phone] (7am - 7am) Advanced Heart Failure Team  Pager [phone] (M-F; 7a - 4p)Please contact [facility] for night-coverage after hours (4p -7a ) and weekends on [email or web address]  Patient seen and examined with Darrick Grinder, NP. We discussed all aspects of the encounter. I agree with the assessment and plan as stated above.   Doing well on bival. LDH way down. No bleeding. Volume status and VAD parameters stable. Can likely go home in am. Anticoagulation discussed with PharmD.   Glori Bickers, MD  1:43 PM

## 2017-01-21 NOTE — Progress Notes (Addendum)
[place] for bivalirudin and warfarin Indication: LVAD with possible pump thrombosis  Allergies  Allergen Reactions  . Ace Inhibitors     Hypotension and elevated creatinine  . Diovan [Valsartan] Other (See Comments)    Hypotension at low dose, hyperkalemia  . Lipitor [Atorvastatin Calcium] Other (See Comments)    Muscle pain  . Norvasc [Amlodipine] Other (See Comments)    Put patient to sleep   Vital Signs: Temp: 97.5 F (36.4 C) (05/24 0526) Temp Source: Oral (05/24 0526) BP: 116/92 (05/24 0735) Pulse Rate: 72 (05/24 0526)  Labs:  Recent Labs  01/19/17 1533 01/19/17 1838  01/20/17 0025  01/20/17 0641 01/20/17 1134 01/21/17 0308  HGB 13.7  --   --  13.9  --   --   --  14.2  HCT 42.5  --   --  42.5  --   --   --  43.8  PLT 117*  --   --  103*  --   --   --  114*  APTT  --  99*  < > 91*  < > 84* 68* 71*  LABPROT  --  42.5*  --  41.3*  --   --   --  34.2*  INR  --  4.44*  --  4.18*  --   --   --  3.29  CREATININE 1.35*  --   --  1.42*  --   --   --  1.37*  < > = values in this interval not displayed.  Estimated Creatinine Clearance: 44.9 mL/min (A) (by C-G formula based on SCr of 1.37 mg/dL (H)).   Medical History: Past Medical History:  Diagnosis Date  . Anxiety   . Automatic implantable cardioverter-defibrillator in situ   . CAD (coronary artery disease)    S/P stenting of LCX in 2004;  s/p Lat MI 2009 with occlusion of the LCX - treated with Promus stenting. Has total occlusion of the RCA.   [name] CHF (congestive heart failure) ([place])   . Hypercholesteremia   . Hypertension   . Hypothyroidism   . ICD (implantable cardiac defibrillator) in place    PROPHYLACTIC      medtronic  . Inferior MI ([place]) "? date"; 2009  . Ischemic cardiomyopathy    a. 10/09 Echo: Sev LV Dysfxn, inf/lat AK, Mod MR.  [name] LVAD (left ventricular assist device) present ([place])   . Osteoarthritis    a. s/p R TKR  . Pacemaker   . PVC (premature  ventricular contraction)   . RBBB   . Shortness of breath     Medications:  Warfarin 7.5mg  daily except 10mg  on Sunday, Wednesday PTA  Assessment: 76 year old male with HMII placed 09/19/13. Patient has been having upward trending LDH from 150 last year to 430s at admission. Patient to be admitted for possible pump thrombosis and anticoagulation. -LDH down to 190 today -CBC stable  Aptt this am is at goal (71), INR has decreased from 4.1 >> 3.29. Bivalrudin does cause false elevation of INR and she was therapeutic at 2.4 on AM of admission. Only got 7.5mg  of warfarin last night instead of 10mg  like other Wednesdays  Goal of Therapy:  Aptt goal 50-85s INR 2-3 Monitor platelets by anticoagulation protocol: Yes   Plan:  Continue bivalirudin at 0.045 mg/kg/hr. Warfarin 10 mg tonight Daily INR, aptt, CBC, LDH  Myer Peer Grayland Ormond), PharmD  PGY1 Pharmacy Resident Pager: [phone] 01/21/2017 9:09 AM

## 2017-01-21 NOTE — Progress Notes (Signed)
Verbal order received from [facility] that patient is ok to travel off the unit by himself to visit on 2 H.

## 2017-01-22 LAB — PROTIME-INR
INR: 3.21
INR: 2.15
Prothrombin Time: 33.5 seconds — ABNORMAL HIGH (ref 11.4–15.2)
Prothrombin Time: 24.3 seconds — ABNORMAL HIGH (ref 11.4–15.2)

## 2017-01-22 LAB — BASIC METABOLIC PANEL
Anion gap: 10 (ref 5–15)
BUN: 19 mg/dL (ref 6–20)
CO2: 24 mmol/L (ref 22–32)
Calcium: 9.2 mg/dL (ref 8.9–10.3)
Chloride: 104 mmol/L (ref 101–111)
Creatinine, Ser: 1.46 mg/dL — ABNORMAL HIGH (ref 0.61–1.24)
GFR calc Af Amer: 53 mL/min — ABNORMAL LOW (ref >60)
GFR calc non Af Amer: 46 mL/min — ABNORMAL LOW (ref >60)
Glucose, Bld: 100 mg/dL — ABNORMAL HIGH (ref 65–99)
Potassium: 4.1 mmol/L (ref 3.5–5.1)
Sodium: 138 mmol/L (ref 135–145)

## 2017-01-22 LAB — CBC
HCT: 43.8 % (ref 39.0–52.0)
Hemoglobin: 14.2 g/dL (ref 13.0–17.0)
MCH: 29.4 pg (ref 26.0–34.0)
MCHC: 32.4 g/dL (ref 30.0–36.0)
MCV: 90.7 fL (ref 78.0–100.0)
Platelets: 119 10*3/uL — ABNORMAL LOW (ref 150–400)
RBC: 4.83 MIL/uL (ref 4.22–5.81)
RDW: 15.9 % — ABNORMAL HIGH (ref 11.5–15.5)
WBC: 6 10*3/uL (ref 4.0–10.5)

## 2017-01-22 LAB — LACTATE DEHYDROGENASE: LDH: 188 U/L (ref 98–192)

## 2017-01-22 LAB — APTT: aPTT: 72 seconds — ABNORMAL HIGH (ref 24–36)

## 2017-01-22 MED ORDER — WARFARIN SODIUM 7.5 MG PO TABS: 7.5000 mg | ORAL_TABLET | Freq: Once | ORAL | Status: DC

## 2017-01-22 NOTE — Progress Notes (Addendum)
[place] for bivalirudin and warfarin Indication: LVAD with possible pump thrombosis  Allergies  Allergen Reactions  . Ace Inhibitors     Hypotension and elevated creatinine  . Diovan [Valsartan] Other (See Comments)    Hypotension at low dose, hyperkalemia  . Lipitor [Atorvastatin Calcium] Other (See Comments)    Muscle pain  . Norvasc [Amlodipine] Other (See Comments)    Put patient to sleep   Vital Signs: Temp: 97.7 F (36.5 C) (05/25 0500) Temp Source: Oral (05/25 0500) BP: 99/81 (05/25 0500) Pulse Rate: 75 (05/25 0500)  Labs:  Recent Labs  01/20/17 0025  01/21/17 0308 01/21/17 1109 01/22/17 0237  HGB 13.9  --  14.2  --  14.2  HCT 42.5  --  43.8  --  43.8  PLT 103*  --  114*  --  119*  APTT 91*  < > 71* 70* 72*  LABPROT 41.3*  --  34.2* 30.8* 33.5*  INR 4.18*  --  3.29 2.89 3.21  CREATININE 1.42*  --  1.37*  --  1.46*  < > = values in this interval not displayed.  Estimated Creatinine Clearance: 42.1 mL/min (A) (by C-G formula based on SCr of 1.46 mg/dL (H)).   Medical History: Past Medical History:  Diagnosis Date  . Anxiety   . Automatic implantable cardioverter-defibrillator in situ   . CAD (coronary artery disease)    S/P stenting of LCX in 2004;  s/p Lat MI 2009 with occlusion of the LCX - treated with Promus stenting. Has total occlusion of the RCA.   [name] CHF (congestive heart failure) ([place])   . Hypercholesteremia   . Hypertension   . Hypothyroidism   . ICD (implantable cardiac defibrillator) in place    PROPHYLACTIC      medtronic  . Inferior MI ([place]) "? date"; 2009  . Ischemic cardiomyopathy    a. 10/09 Echo: Sev LV Dysfxn, inf/lat AK, Mod MR.  [name] LVAD (left ventricular assist device) present ([place])   . Osteoarthritis    a. s/p R TKR  . Pacemaker   . PVC (premature ventricular contraction)   . RBBB   . Shortness of breath     Medications:  Warfarin 7.5mg  daily except 10mg  on Sunday, Wednesday  PTA  Assessment: 76 year old male with HMII placed 09/19/13. Patient has been having upward trending LDH from 150 last year to 430s at admission. Patient to be admitted for possible pump thrombosis and anticoagulation. -LDH down to 190 today -CBC stable -Aptt this am is at goal (72). Bivalrudin does cause false elevation of INR and she was therapeutic at 2.4 on AM of admission.  -After Bival stopped, an INR was drawn 4 hours later and was 2.15, therapeutic.   Home dose: warfarin 7.5mg  daily except 10mg  sun/wed  Goal of Therapy:  Aptt goal 50-85s INR 2-3 Monitor platelets by anticoagulation protocol: Yes   Plan: Bivalirudin at 0.045 mg/kg/hr stopping this AM at 0800 Warfarin 7.5 mg tonight Daily INR, LDH  Myer Peer Grayland Ormond), PharmD  PGY1 Pharmacy Resident Pager: [phone] 01/22/2017 6:51 AM

## 2017-01-22 NOTE — Care Management Important Message (Signed)
Important Message  Patient Details  Name: Johnny Oneill MRN: [id number] Date of Birth: [birth year]   Medicare Important Message Given:  Yes    [name] 01/22/2017, 12:22 PM

## 2017-01-22 NOTE — Progress Notes (Signed)
HeartMate 2 Rounding Note  Subjective:    Admitted with elevated LDH. Started on bival.   Remains on Bival. LDH trending down 438>237> 190> 188  Feels good. No bleeding   LVAD INTERROGATION:  HeartMate II LVAD:  Flow 3.5 lliters/min, speed 9200, power 5, PI 7.1  Occasional PI events over night.    Objective:    Vital Signs:   Temp:  [97.5 F (36.4 C)-98.2 F (36.8 C)] 98 F (36.7 C) (05/25 0731) Pulse Rate:  [73-98] 73 (05/25 0731) Resp:  [17-18] 18 (05/25 0731) BP: (99-106)/(81-87) 102/84 (05/25 0731) SpO2:  [98 %-100 %] 99 % (05/25 0731) Weight:  [72.3 kg (159 lb 4.8 oz)] 72.3 kg (159 lb 4.8 oz) (05/25 0500) Last BM Date: 01/21/17 Mean arterial Pressure  90-100s  Intake/Output:   Intake/Output Summary (Last 24 hours) at 01/22/17 0925 Last data filed at 01/22/17 0806  Gross per 24 hour  Intake              240 ml  Output              640 ml  Net             -400 ml     Physical Exam: GENERAL: Well appearing, male who presents to clinic today in no acute distress. HEENT: normal anicteric NECK: Supple, JVP 6-7 .  2+ bilaterally, no bruits.  No lymphadenopathy or thyromegaly appreciated.   CARDIAC:  Mechanical heart sounds with LVAD hum present. Normal HM2 sounds LUNGS:  Clear to auscultation bilaterally. No wheezing ABDOMEN:  Soft, round, nontender, positive bowel sounds x4.     LVAD exit site: well-healed and incorporated.  Dressing dry and intact.  No erythema or drainage.  Stabilization device present and accurately applied.  Driveline dressing is being changed daily per sterile technique. No sings infection  Extremities: no cyanosis, clubbing, rash, edema Neuro: alert & oriented x 3, cranial nerves grossly intact. moves all 4 extremities w/o difficulty. Affect pleasant     Telemetry: A sensed V paced. 49s Personally reviewed  Labs: Basic Metabolic Panel:  Recent Labs Lab 01/19/17 1533 01/20/17 0025 01/21/17 0308 01/22/17 0237  NA 138 139 137 138   K 4.1 4.5 4.1 4.1  CL 109 106 104 104  CO2 20* 26 26 24   GLUCOSE 106* 106* 101* 100*  BUN 18 15 18 19   CREATININE 1.35* 1.42* 1.37* 1.46*  CALCIUM 8.5* 8.9 8.9 9.2    Liver Function Tests:  Recent Labs Lab 01/19/17 1533  AST 26  ALT 19  ALKPHOS 78  BILITOT 0.6  PROT 6.2*  ALBUMIN 4.0   No results for input(s): LIPASE, AMYLASE in the last 168 hours. No results for input(s): AMMONIA in the last 168 hours.  CBC:  Recent Labs Lab 01/19/17 1533 01/20/17 0025 01/21/17 0308 01/22/17 0237  WBC 7.1 5.1 5.8 6.0  NEUTROABS 5.3  --   --   --   HGB 13.7 13.9 14.2 14.2  HCT 42.5 42.5 43.8 43.8  MCV 91.8 91.0 91.4 90.7  PLT 117* 103* 114* 119*    INR:  Recent Labs Lab 01/19/17 1838 01/20/17 0025 01/21/17 0308 01/21/17 1109 01/22/17 0237  INR 4.44* 4.18* 3.29 2.89 3.21    Other results:  EKG:   Imaging: No results found.   Medications:     Scheduled Medications: . aspirin EC  325 mg Oral Daily  . citalopram  10 mg Oral Daily  . famotidine  20 mg  Oral Daily  . hydrALAZINE  50 mg Oral TID  . lactobacillus acidophilus  1 tablet Oral Daily  . levothyroxine  125 mcg Oral Once per day on Mon Tue Wed Thu Fri Sat  . [START ON 01/24/2017] levothyroxine  187.5 mcg Oral Once per day on Sun  . pantoprazole  40 mg Oral Daily  . rosuvastatin  20 mg Oral Daily  . spironolactone  25 mg Oral Daily  . temazepam  15 mg Oral QHS  . warfarin  7.5 mg Oral ONCE-1800  . Warfarin - Pharmacist Dosing Inpatient   Does not apply q1800    Infusions:   PRN Medications: acetaminophen, ondansetron (ZOFRAN) IV   Assessment:  1. LVAD--> Elevated LDH Possible Pump Thrombosis 2. Chronic Systolic Heart Failure  3. Chronic Anticoagulation.    Plan/Discussion:    LDH way down. Hgb ok. Stop bival. Check INR in 4 hours. Home in INR 1.8 or greater. Recheck LDH next week,   I reviewed the LVAD parameters from today, and compared the results to the patient's prior recorded data.   No programming changes were made.  The LVAD is functioning within specified parameters.  The patient performs LVAD self-test daily.  LVAD interrogation was negative for any significant power changes, alarms or PI events/speed drops.  LVAD equipment check completed and is in good working order.  Back-up equipment present.   LVAD education done on emergency procedures and precautions and reviewed exit site care.   Hopefully home in am or later today.  Length of Stay: 3  Aviona Martenson NP-C  01/22/2017, 9:25 AM  VAD Team --- VAD ISSUES ONLY--- Pager [phone] (7am - 7am) Advanced Heart Failure Team  Pager [phone] (M-F; 7a - 4p)Please contact [facility] for night-coverage after hours (4p -7a ) and weekends on [email or web address]

## 2017-01-22 NOTE — Progress Notes (Addendum)
Patient in a stable condition,refused vital signs check this afternoon.  discharged education reviewed with patient and wife at bedside, they verbalized understanding, iv removed, tele dc ccmd notified, patient belongings at bedside, wife walked off the unit with his wife. Patient refused a wheelchair

## 2017-01-23 LAB — POCT INR: INR: 2.2

## 2017-01-26 ENCOUNTER — Ambulatory Visit (HOSPITAL_COMMUNITY): Payer: Self-pay | Admitting: Pharmacist

## 2017-01-26 ENCOUNTER — Other Ambulatory Visit (HOSPITAL_COMMUNITY): Payer: Self-pay | Admitting: *Deleted

## 2017-01-26 DIAGNOSIS — I5043 Acute on chronic combined systolic (congestive) and diastolic (congestive) heart failure: Secondary | ICD-10-CM

## 2017-01-26 DIAGNOSIS — Z95811 Presence of heart assist device: Secondary | ICD-10-CM

## 2017-01-26 DIAGNOSIS — Z7901 Long term (current) use of anticoagulants: Secondary | ICD-10-CM

## 2017-01-27 ENCOUNTER — Encounter (HOSPITAL_COMMUNITY): Payer: Medicare Other

## 2017-01-27 ENCOUNTER — Ambulatory Visit (HOSPITAL_COMMUNITY): Payer: Self-pay | Admitting: Pharmacist

## 2017-01-27 ENCOUNTER — Ambulatory Visit (HOSPITAL_COMMUNITY)
Admission: RE | Admit: 2017-01-27 | Discharge: 2017-01-27 | Disposition: A | Discharge status: Home / Self Care | Payer: Medicare Other | Source: Ambulatory Visit | Attending: Cardiology | Admitting: Cardiology

## 2017-01-27 ENCOUNTER — Other Ambulatory Visit (HOSPITAL_COMMUNITY): Payer: Self-pay | Admitting: *Deleted

## 2017-01-27 DIAGNOSIS — I5043 Acute on chronic combined systolic (congestive) and diastolic (congestive) heart failure: Secondary | ICD-10-CM

## 2017-01-27 DIAGNOSIS — Z7901 Long term (current) use of anticoagulants: Secondary | ICD-10-CM | POA: Insufficient documentation

## 2017-01-27 DIAGNOSIS — Z95811 Presence of heart assist device: Secondary | ICD-10-CM | POA: Insufficient documentation

## 2017-01-27 LAB — CBC
HCT: 46.8 % (ref 39.0–52.0)
Hemoglobin: 15 g/dL (ref 13.0–17.0)
MCH: 29.6 pg (ref 26.0–34.0)
MCHC: 32.1 g/dL (ref 30.0–36.0)
MCV: 92.3 fL (ref 78.0–100.0)
Platelets: 148 10*3/uL — ABNORMAL LOW (ref 150–400)
RBC: 5.07 MIL/uL (ref 4.22–5.81)
RDW: 15.9 % — ABNORMAL HIGH (ref 11.5–15.5)
WBC: 4.9 10*3/uL (ref 4.0–10.5)

## 2017-01-27 LAB — BASIC METABOLIC PANEL
Anion gap: 8 (ref 5–15)
BUN: 19 mg/dL (ref 6–20)
CO2: 24 mmol/L (ref 22–32)
Calcium: 9.2 mg/dL (ref 8.9–10.3)
Chloride: 105 mmol/L (ref 101–111)
Creatinine, Ser: 1.56 mg/dL — ABNORMAL HIGH (ref 0.61–1.24)
GFR calc Af Amer: 49 mL/min — ABNORMAL LOW (ref >60)
GFR calc non Af Amer: 42 mL/min — ABNORMAL LOW (ref >60)
Glucose, Bld: 125 mg/dL — ABNORMAL HIGH (ref 65–99)
Potassium: 3.9 mmol/L (ref 3.5–5.1)
Sodium: 137 mmol/L (ref 135–145)

## 2017-01-27 LAB — LACTATE DEHYDROGENASE: LDH: 251 U/L — ABNORMAL HIGH (ref 98–192)

## 2017-01-27 LAB — PROTIME-INR
INR: 1.97
Prothrombin Time: 22.7 seconds — ABNORMAL HIGH (ref 11.4–15.2)

## 2017-01-28 DIAGNOSIS — Z7901 Long term (current) use of anticoagulants: Secondary | ICD-10-CM | POA: Diagnosis not present

## 2017-02-04 ENCOUNTER — Other Ambulatory Visit (HOSPITAL_COMMUNITY): Payer: Self-pay | Admitting: Pharmacist

## 2017-02-04 ENCOUNTER — Ambulatory Visit (HOSPITAL_COMMUNITY): Payer: Self-pay | Admitting: Pharmacist

## 2017-02-04 ENCOUNTER — Ambulatory Visit (HOSPITAL_COMMUNITY)
Admission: RE | Admit: 2017-02-04 | Discharge: 2017-02-04 | Disposition: A | Discharge status: Home / Self Care | Payer: Medicare Other | Source: Ambulatory Visit | Attending: Cardiology | Admitting: Cardiology

## 2017-02-04 DIAGNOSIS — Z95811 Presence of heart assist device: Secondary | ICD-10-CM | POA: Diagnosis not present

## 2017-02-04 DIAGNOSIS — I5043 Acute on chronic combined systolic (congestive) and diastolic (congestive) heart failure: Secondary | ICD-10-CM

## 2017-02-04 DIAGNOSIS — Z7901 Long term (current) use of anticoagulants: Secondary | ICD-10-CM

## 2017-02-04 LAB — BASIC METABOLIC PANEL
Anion gap: 7 (ref 5–15)
BUN: 20 mg/dL (ref 6–20)
CO2: 27 mmol/L (ref 22–32)
Calcium: 9.1 mg/dL (ref 8.9–10.3)
Chloride: 105 mmol/L (ref 101–111)
Creatinine, Ser: 1.5 mg/dL — ABNORMAL HIGH (ref 0.61–1.24)
GFR calc Af Amer: 52 mL/min — ABNORMAL LOW (ref >60)
GFR calc non Af Amer: 44 mL/min — ABNORMAL LOW (ref >60)
Glucose, Bld: 87 mg/dL (ref 65–99)
Potassium: 4.2 mmol/L (ref 3.5–5.1)
Sodium: 139 mmol/L (ref 135–145)

## 2017-02-04 LAB — POCT INR: INR: 2.8

## 2017-02-04 LAB — LACTATE DEHYDROGENASE: LDH: 227 U/L — ABNORMAL HIGH (ref 98–192)

## 2017-02-04 MED ORDER — WARFARIN SODIUM 5 MG PO TABS: ORAL_TABLET | ORAL | 6 refills | Status: DC

## 2017-02-04 NOTE — Progress Notes (Signed)
Patient presents for nurse visit with labs as discharge f/u from hospitalization 5/22 - 01/22/17 for elevated LDH with BiVal  Reports no problems with VAD equipment or concerns with drive line.  Vital Signs:  Doppler Pressure: 98 Automatc BP: 102/53 (68) HR:  50 SPO2: 97 %   VAD interrogation & Equipment Management: Speed: 9200 Flow: 5.1 Power: 5.4 w    PI: 6.5  Alarms: no clinical alarms  Events: 5 - 10 PI events  Fixed speed: 9200 Low speed limit: 8600  Primary Controller:  Replace back up battery in 16 months. Back up controller:   Replace back up battery in 16 months.   I reviewed the LVAD parameters from today and compared the results to the patient's prior recorded data. LVAD interrogation was NEGATIVE for significant power changes, NEGATIVE for clinical alarms and STABLE for PI events/speed drops. No programming changes were made and pump is functioning within specified parameters. Pt is performing daily controller and system monitor self tests along with completing weekly and monthly maintenance for LVAD equipment.  LVAD equipment check completed and is in good working order. Back-up equipment present.   BP & Labs:  Doppler  98 - Doppler is reflecting modified systolic pressure.  LDH stable 251 - up from discharge LDH of 188. Denies tea colored urine. No power elevations noted on interrogation.   Reviewed LVAD parameters and lab results with Dr. Haroldine Laws. Pt will repeat BMET and LDH in one week.   Zada Girt, RN VAD Coordinator   Office: [phone] 24/7 Emergency VAD Pager: [phone]

## 2017-02-04 NOTE — Patient Instructions (Signed)
1.  Repeat labs in one week.

## 2017-02-11 ENCOUNTER — Ambulatory Visit (HOSPITAL_COMMUNITY): Payer: Self-pay | Admitting: Pharmacist

## 2017-02-11 LAB — POCT INR: INR: 2.4

## 2017-02-18 ENCOUNTER — Ambulatory Visit (HOSPITAL_COMMUNITY)
Admission: RE | Admit: 2017-02-18 | Discharge: 2017-02-18 | Disposition: A | Discharge status: Home / Self Care | Payer: Medicare Other | Source: Ambulatory Visit | Attending: Internal Medicine | Admitting: Internal Medicine

## 2017-02-18 ENCOUNTER — Ambulatory Visit (HOSPITAL_COMMUNITY): Payer: Self-pay | Admitting: Pharmacist

## 2017-02-18 ENCOUNTER — Other Ambulatory Visit (HOSPITAL_COMMUNITY): Payer: Self-pay | Admitting: *Deleted

## 2017-02-18 DIAGNOSIS — Z95811 Presence of heart assist device: Secondary | ICD-10-CM

## 2017-02-18 DIAGNOSIS — Z7901 Long term (current) use of anticoagulants: Secondary | ICD-10-CM

## 2017-02-18 DIAGNOSIS — I5043 Acute on chronic combined systolic (congestive) and diastolic (congestive) heart failure: Secondary | ICD-10-CM | POA: Diagnosis not present

## 2017-02-18 LAB — PROTIME-INR
INR: 2.52
Prothrombin Time: 27.6 seconds — ABNORMAL HIGH (ref 11.4–15.2)

## 2017-02-18 LAB — LACTATE DEHYDROGENASE: LDH: 213 U/L — ABNORMAL HIGH (ref 98–192)

## 2017-02-25 ENCOUNTER — Ambulatory Visit (HOSPITAL_COMMUNITY): Payer: Self-pay | Admitting: Pharmacist

## 2017-02-25 DIAGNOSIS — Z7901 Long term (current) use of anticoagulants: Secondary | ICD-10-CM | POA: Diagnosis not present

## 2017-02-25 LAB — POCT INR: INR: 3.7

## 2017-03-04 ENCOUNTER — Ambulatory Visit (HOSPITAL_COMMUNITY): Payer: Self-pay | Admitting: Pharmacist

## 2017-03-04 LAB — POCT INR: INR: 3.3

## 2017-03-05 ENCOUNTER — Telehealth (HOSPITAL_COMMUNITY): Payer: Self-pay | Admitting: *Deleted

## 2017-03-05 DIAGNOSIS — B369 Superficial mycosis, unspecified: Secondary | ICD-10-CM

## 2017-03-05 MED ORDER — FLUCONAZOLE 100 MG PO TABS: 100.0000 mg | ORAL_TABLET | Freq: Every day | ORAL | 0 refills | Status: DC

## 2017-03-05 NOTE — Telephone Encounter (Signed)
Pt contacted Dr. Haroldine Laws re: reddened area around vad drive line exit site. Fungal in nature per Dr. Haroldine Laws, will send Rx. Wife notified.

## 2017-03-08 ENCOUNTER — Encounter (HOSPITAL_COMMUNITY): Payer: Self-pay | Admitting: *Deleted

## 2017-03-08 ENCOUNTER — Ambulatory Visit (HOSPITAL_COMMUNITY): Payer: Self-pay | Admitting: Pharmacist

## 2017-03-08 ENCOUNTER — Ambulatory Visit (HOSPITAL_COMMUNITY)
Admission: RE | Admit: 2017-03-08 | Discharge: 2017-03-08 | Disposition: A | Discharge status: Home / Self Care | Payer: Medicare Other | Source: Ambulatory Visit | Attending: Internal Medicine | Admitting: Internal Medicine

## 2017-03-08 DIAGNOSIS — I5022 Chronic systolic (congestive) heart failure: Secondary | ICD-10-CM | POA: Insufficient documentation

## 2017-03-08 LAB — PROTIME-INR
INR: 2.28
Prothrombin Time: 25.5 seconds — ABNORMAL HIGH (ref 11.4–15.2)

## 2017-03-08 NOTE — Progress Notes (Signed)
Patient presents to clinic today for drive line exit wound care. Existing VAD dressing removed and site care performed using sterile technique. Drive line exit site cleaned with sterile saline wipes, allowed to dry, and gauze daily dressing with aquacell silver re-applied. Exit site healed and incorporated, the velour is fully implanted at exit site. Small amount of redness noted. No tenderness, drainage, foul odor or rash noted. Drive line anchor re-applied. Pt denies fever or chills.   Patient finished 3 day course of Diflucan today. Plan to continue to use daily kits every 2-3 days until redness improves. Instructed to call VAD coordinators with concerns.     Balinda Quails RN, VAD Coordinator 24/7 pager [phone]

## 2017-03-11 ENCOUNTER — Ambulatory Visit (HOSPITAL_COMMUNITY): Payer: Self-pay | Admitting: Pharmacist

## 2017-03-11 LAB — POCT INR: INR: 4.4

## 2017-03-16 ENCOUNTER — Other Ambulatory Visit (HOSPITAL_COMMUNITY): Payer: Self-pay | Admitting: Unknown Physician Specialty

## 2017-03-16 ENCOUNTER — Ambulatory Visit (HOSPITAL_COMMUNITY)
Admission: RE | Admit: 2017-03-16 | Discharge: 2017-03-16 | Disposition: A | Discharge status: Home / Self Care | Payer: Medicare Other | Source: Ambulatory Visit | Attending: Cardiology | Admitting: Cardiology

## 2017-03-16 ENCOUNTER — Telehealth (HOSPITAL_COMMUNITY): Payer: Self-pay | Admitting: Unknown Physician Specialty

## 2017-03-16 ENCOUNTER — Ambulatory Visit (HOSPITAL_COMMUNITY): Payer: Self-pay | Admitting: Pharmacist

## 2017-03-16 VITALS — BP 80/0 | Wt 166.0 lb

## 2017-03-16 DIAGNOSIS — I110 Hypertensive heart disease with heart failure: Secondary | ICD-10-CM | POA: Diagnosis not present

## 2017-03-16 DIAGNOSIS — Z7982 Long term (current) use of aspirin: Secondary | ICD-10-CM | POA: Diagnosis not present

## 2017-03-16 DIAGNOSIS — I4510 Unspecified right bundle-branch block: Secondary | ICD-10-CM | POA: Diagnosis not present

## 2017-03-16 DIAGNOSIS — I10 Essential (primary) hypertension: Secondary | ICD-10-CM

## 2017-03-16 DIAGNOSIS — I252 Old myocardial infarction: Secondary | ICD-10-CM | POA: Diagnosis not present

## 2017-03-16 DIAGNOSIS — Z7901 Long term (current) use of anticoagulants: Secondary | ICD-10-CM

## 2017-03-16 DIAGNOSIS — I5022 Chronic systolic (congestive) heart failure: Secondary | ICD-10-CM | POA: Diagnosis not present

## 2017-03-16 DIAGNOSIS — Z95811 Presence of heart assist device: Secondary | ICD-10-CM | POA: Diagnosis not present

## 2017-03-16 DIAGNOSIS — Z95810 Presence of automatic (implantable) cardiac defibrillator: Secondary | ICD-10-CM | POA: Insufficient documentation

## 2017-03-16 DIAGNOSIS — E039 Hypothyroidism, unspecified: Secondary | ICD-10-CM | POA: Diagnosis not present

## 2017-03-16 DIAGNOSIS — I2510 Atherosclerotic heart disease of native coronary artery without angina pectoris: Secondary | ICD-10-CM | POA: Diagnosis not present

## 2017-03-16 DIAGNOSIS — F419 Anxiety disorder, unspecified: Secondary | ICD-10-CM | POA: Insufficient documentation

## 2017-03-16 LAB — COMPREHENSIVE METABOLIC PANEL
ALT: 16 U/L — ABNORMAL LOW (ref 17–63)
AST: 25 U/L (ref 15–41)
Albumin: 3.9 g/dL (ref 3.5–5.0)
Alkaline Phosphatase: 60 U/L (ref 38–126)
Anion gap: 6 (ref 5–15)
BUN: 19 mg/dL (ref 6–20)
CO2: 23 mmol/L (ref 22–32)
Calcium: 8.8 mg/dL — ABNORMAL LOW (ref 8.9–10.3)
Chloride: 106 mmol/L (ref 101–111)
Creatinine, Ser: 1.43 mg/dL — ABNORMAL HIGH (ref 0.61–1.24)
GFR calc Af Amer: 55 mL/min — ABNORMAL LOW (ref >60)
GFR calc non Af Amer: 47 mL/min — ABNORMAL LOW (ref >60)
Glucose, Bld: 167 mg/dL — ABNORMAL HIGH (ref 65–99)
Potassium: 4.2 mmol/L (ref 3.5–5.1)
Sodium: 135 mmol/L (ref 135–145)
Total Bilirubin: 0.8 mg/dL (ref 0.3–1.2)
Total Protein: 6.5 g/dL (ref 6.5–8.1)

## 2017-03-16 LAB — LACTATE DEHYDROGENASE: LDH: 227 U/L — ABNORMAL HIGH (ref 98–192)

## 2017-03-16 LAB — CBC
HCT: 42.5 % (ref 39.0–52.0)
Hemoglobin: 14.1 g/dL (ref 13.0–17.0)
MCH: 29.9 pg (ref 26.0–34.0)
MCHC: 33.2 g/dL (ref 30.0–36.0)
MCV: 90.2 fL (ref 78.0–100.0)
Platelets: 136 10*3/uL — ABNORMAL LOW (ref 150–400)
RBC: 4.71 MIL/uL (ref 4.22–5.81)
RDW: 15.1 % (ref 11.5–15.5)
WBC: 5.4 10*3/uL (ref 4.0–10.5)

## 2017-03-16 LAB — PROTIME-INR
INR: 3.52
Prothrombin Time: 36.1 seconds — ABNORMAL HIGH (ref 11.4–15.2)

## 2017-03-16 LAB — PREALBUMIN: Prealbumin: 19.9 mg/dL (ref 18–38)

## 2017-03-16 NOTE — Progress Notes (Signed)
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   Admitted 3/4-3/25/15 with SOB and syncopal episode. Found to have SBO and NG placed and started on TPN. On 11/06/13 developed progressive SOB and hypotension and co-ox 51% and Hgb 6.7. Started on milrinone and transfused. Echo showed nl RV function. Patient had PEA arrest and was intubated and started on pressors. CXR revealed a R hemothorax and a R CT placed. He required VATs and evacuation of hemothorax and was then tansferred to CIR.   Admitted with elevated LDH in May 2018. Placed on Bival. LDH peaked at 424. On discharge LDH back down to 188.   Today he returns for LVAD follow up. Overall feeling good. Denies SOB/PND/Orthpnea. No fever or chills. No bleeding problems. Taking all medications. Dressing changed a few times a week by his wife.   Results for Johnny Oneill, Johnny Oneill (MRN [id number]) as of 03/17/2017 14:22  Ref. Range 01/22/2017 02:37 01/22/2017 12:30 01/23/2017 00:00 01/27/2017 09:05 02/04/2017 00:00 02/04/2017 09:57 02/11/2017 00:00 02/18/2017 09:54 02/25/2017 00:00 03/04/2017 00:00 03/08/2017 10:51 03/11/2017 00:00 03/16/2017 11:15  LDH Latest Ref Range: 98 - 192 U/L 188   251 (H)  227 (H)  213 (H)     227 (H)    Echo 3/18 EF 10-15%. RV moderate HK. LVAD cannula ok. Mild AI   Echo 9/16 EF 20%, LVAD cannula ok moderate RV dilation with mild to moderately decreased RV systolic function.    VAD Indication: DT    VAD interrogation & Equipment Management: Speed:9200 Flow: 4.7 Power:5.3 PI: 6.3   Alarms: no clinical alarms Events: 6-10 PI events daily  Fixed speed 9200 Low speed limit: 8600  Primary Controller: Replace back up battery in 4months.    Past Medical History:  Diagnosis Date  . Anxiety   . Automatic implantable cardioverter-defibrillator in situ   . CAD (coronary artery disease)    S/P stenting of LCX in 2004;  s/p Lat MI 2009 with occlusion of the LCX - treated with Promus stenting. Has total occlusion of the RCA.   [name] CHF (congestive heart failure) ([place])   . Hypercholesteremia   . Hypertension   . Hypothyroidism   . ICD (implantable cardiac defibrillator) in place    PROPHYLACTIC      medtronic  . Inferior MI ([place]) "? date"; 2009  . Ischemic cardiomyopathy    a. 10/09 Echo: Sev LV Dysfxn, inf/lat AK, Mod MR.  [name] LVAD (left ventricular assist device) present ([place])   . Osteoarthritis    a. s/p R TKR  . Pacemaker   . PVC (premature ventricular contraction)   . RBBB   . Shortness of breath     Current Outpatient Prescriptions  Medication Sig Dispense Refill  . acetaminophen (TYLENOL) 500 MG chewable tablet Chew 500 mg by mouth every 6 (six) hours as needed for pain.    [name] aspirin EC 325 MG tablet Take 325 mg by mouth daily.    . citalopram (CELEXA) 10 MG tablet Take 1 tablet (10 mg total) by mouth daily. 90 tablet 3  . Coenzyme Q10 (COQ10) 200 MG CAPS Take 200 mg by mouth daily.    . hydrALAZINE (APRESOLINE) 50 MG tablet Take 1 tablet (50 mg total) by mouth 3 (three) times daily. 270 tablet 3  . levothyroxine (SYNTHROID, LEVOTHROID) 125 MCG tablet Take 125-187.5 mcg by  mouth daily before breakfast. Take 125 mcg (1 tablet) daily except 187.5 mcg (1 and 1/2 tablets) on Sunday    . Melatonin 3 MG TABS Take 1 tablet by mouth at bedtime.    . pantoprazole (PROTONIX) 40 MG tablet TAKE ONE TABLET BY MOUTH ONCE DAILY 90 tablet 3  . potassium chloride SA (K-DUR,KLOR-CON) 20 MEQ tablet Take 1 tablet (20 mEq total) by mouth daily as needed. Take one potassium pill when you take your lasix. 90 tablet 3  . Probiotic Product (PROBIOTIC PO) Take 1 capsule by mouth daily.    . ranitidine (ZANTAC) 150 MG tablet Take 150 mg by mouth daily as needed for heartburn.    . rosuvastatin (CRESTOR) 20 MG tablet Take 1 tablet (20 mg total) by mouth daily. 90 tablet 3  . spironolactone  (ALDACTONE) 25 MG tablet Take 1 tablet (25 mg total) by mouth daily. 30 tablet 3  . temazepam (RESTORIL) 15 MG capsule Take 1 capsule (15 mg total) by mouth at bedtime. (Patient taking differently: Take 15 mg by mouth every other day as needed for sleep. ) 30 capsule 0  . warfarin (COUMADIN) 5 MG tablet Take 10 mg (2 tablets) daily except 7.5 mg (1 and 1/2 tablets) on Tues/Thurs/Sat. 60 tablet 6  . fluconazole (DIFLUCAN) 100 MG tablet Take 1 tablet (100 mg total) by mouth daily. (Patient not taking: Reported on 03/16/2017) 3 tablet 0  . furosemide (LASIX) 20 MG tablet Take 20 mg by mouth daily as needed (Take 20 mg as daily as needed for wt > 153 lbs). Reported on 01/14/2016     No current facility-administered medications for this encounter.    Facility-Administered Medications Ordered in Other Encounters  Medication Dose Route Frequency Provider Last Rate Last Dose  . etomidate (AMIDATE) injection    Anesthesia Intra-op Myna Bright, CRNA   20 mg at 02/08/14 0915  . rocuronium [facility]) injection    Anesthesia Intra-op Myna Bright, CRNA   50 mg at 02/08/14 0932  . succinylcholine (ANECTINE) injection    Anesthesia Intra-op Myna Bright, CRNA   100 mg at 02/08/14 0915    Ace inhibitors; Diovan [valsartan]; Lipitor [atorvastatin calcium]; and Norvasc [amlodipine]  REVIEW OF SYSTEMS: All systems negative except as listed in HPI, PMH and Problem list.   Vitals:   03/16/17 1115 03/16/17 1116  BP: (!) 80/0   Weight:  166 lb (75.3 kg)   Vital Signs:  Doppler Pressure 80            Automatc BP: 114/58 (81)  HR 50s SPO2: 97  Weight: 166 pounds  Last weight: 162 lb Home weights: 160 lbs   Vital signs: BP (!) 80/0 Comment: doppler map  Wt 166 lb (75.3 kg)   BMI 24.51 kg/m    Physical Exam: GENERAL: Well appearing, male who presents to clinic today in no acute distress. HEENT: normal  NECK: Supple, JVP 5-6.  2+ bilaterally, no bruits.  No lymphadenopathy or  thyromegaly appreciated.   CARDIAC:  Mechanical heart sounds with LVAD hum present.  LUNGS:  Clear to auscultation bilaterally.  ABDOMEN:  Soft, round, nontender, positive bowel sounds x4.     LVAD exit site: well-healed and incorporated.  Dressing dry and intact.  No erythema or drainage.  Stabilization device present and accurately applied.  Driveline dressing is being changed daily per sterile technique. EXTREMITIES:  Warm and dry, no cyanosis, clubbing, rash or edema  NEUROLOGIC:  Alert and oriented x 4.  Gait steady.  No aphasia.  No dysarthria.  Affect pleasant.      ASSESSMENT AND PLAN:   1) Chronic systolic HF: ICM, EF 30-13% s/p LVAD HM II implant 08/2013 for DT.   - Echo 3/18 EF 10-15% mild AI. Mod RV dysfunction. LVAD cannula ok. NYHA I. Volume status stable. Continue lasix as needed.  -  He has not tolerated ACE-I or amlodipine in the past. - ICD at [facility]. Has seen Dr. Rayann Heman in 2/18 and decided not to replace. Will not replace.   2) LVAD: HMII 08/2013 for DT.  LVAD parameters stable. Admitted in May with elevated LDH with bival.  Todays LDH is 227. Continue aspirin and coumadin.  3) Chronic anticoagulation: INR goal 2.0-3.0.   No bleeding issues.   - INR 3.5.  Pharm D addressing. - Continue ASA 325 mg for LVAD + warfarin. Dosing d/w PharmD. 4) HTN:  --Stable.  5) Hypothyroidism:  ---Per Dr Dwyane Dee. Continue synthroid. Continue synthroid 150 mcg. 6) Hyperlipidemia:  -- he is on Crestor. Zetia stopped due to cost.  7) CAD --stable. No s/s ischemia 8) Frequent PVCs  Follow up in 2 months. Check LDH in 1 month.   Greater than 50% of the (total minutes 25) visit spent in counseling/coordination of care regarding heart failure, lab work, and VAD parameters.    Amy Clegg NP-C  03/16/2017

## 2017-03-16 NOTE — Telephone Encounter (Signed)
Called pt to inform him of normal lab results. Pt was thrilled to hear his LDH was in normal range.

## 2017-03-16 NOTE — Progress Notes (Signed)
Patient presents to clinic for 2 month fu. Reports no issues with driveline or VAD.  Vital Signs:  Doppler Pressure: 80 Automatc BP: 114/58 (81) HR:  42 SPO2: 97 %   VAD interrogation & Equipment Management: Speed: 9200 Flow: 5.1 Power: 5.4 w    PI: 6.5  Alarms: no clinical alarms  Events: 5 - 10 PI events  Fixed speed: 9200 Low speed limit: 8600  Primary Controller:  Replace back up battery in 14 months. Back up controller:   Replace back up battery in 14 months.   I reviewed the LVAD parameters from today and compared the results to the patient's prior recorded data. LVAD interrogation was NEGATIVE for significant power changes, NEGATIVE for clinical alarms and STABLE for PI events/speed drops. No programming changes were made and pump is functioning within specified parameters. Pt is performing daily controller and system monitor self tests along with completing weekly and monthly maintenance for LVAD equipment.  LVAD equipment check completed and is in good working order. Back-up equipment present.   Driveline Dressing:  Drive line is being maintained weekly by [name], his wife. Drive line exit site well healed and incorporated. The velour is fully implanted at exit site. Dressing dry and intact. No erythema or drainage. Stabilization device present and accurately applied. Pt denies fever or chills. Pt states they have adequate dressing supplies at home. Pt was recently treated for a fungal infection around his driveline with Diflucan. Pt states that his driveline looks totally normal and has completely cleared.   BP & Labs:  Doppler  80 - Doppler is reflecting modified systolic pressure.  LDH stable 227 - stable within his range of 210-260 . Denies tea colored urine. No power elevations noted on interrogation.    3.5 year Intermacs follow up completed including:  Quality of Life, KCCQ-12, and Neurocognitive trail making (28min 57 sec).   Pt completed  feet during 6  minute walk.  Back up controller:  11V backup battery charged during this visit.   Plan:  1. Return to clinic in 2 months.    Tanda Rockers, RN VAD Coordinator   Office: [phone] 24/7 Emergency VAD Pager: [phone]

## 2017-03-18 ENCOUNTER — Ambulatory Visit (HOSPITAL_COMMUNITY): Payer: Self-pay | Admitting: Pharmacist

## 2017-03-18 LAB — POCT INR: INR: 4.3

## 2017-03-23 ENCOUNTER — Ambulatory Visit (HOSPITAL_COMMUNITY): Payer: Self-pay | Admitting: *Deleted

## 2017-03-23 LAB — POCT INR: INR: 2.3

## 2017-03-25 DIAGNOSIS — Z7901 Long term (current) use of anticoagulants: Secondary | ICD-10-CM | POA: Diagnosis not present

## 2017-03-25 DIAGNOSIS — H61009 Unspecified perichondritis of external ear, unspecified ear: Secondary | ICD-10-CM | POA: Diagnosis not present

## 2017-03-30 ENCOUNTER — Other Ambulatory Visit: Payer: Self-pay | Admitting: Endocrinology

## 2017-04-01 ENCOUNTER — Ambulatory Visit (HOSPITAL_COMMUNITY): Payer: Self-pay | Admitting: Pharmacist

## 2017-04-01 LAB — POCT INR: INR: 1.7

## 2017-04-01 MED ORDER — ENOXAPARIN SODIUM 40 MG/0.4ML ~~LOC~~ SOLN: 40.0000 mg | Freq: Two times a day (BID) | SUBCUTANEOUS | 1 refills | Status: DC

## 2017-04-05 ENCOUNTER — Ambulatory Visit (HOSPITAL_COMMUNITY): Payer: Self-pay | Admitting: Pharmacist

## 2017-04-05 LAB — POCT INR: INR: 2

## 2017-04-07 ENCOUNTER — Ambulatory Visit (HOSPITAL_COMMUNITY): Payer: Self-pay | Admitting: Pharmacist

## 2017-04-07 LAB — POCT INR: INR: 2.8

## 2017-04-13 ENCOUNTER — Other Ambulatory Visit (HOSPITAL_COMMUNITY): Payer: Self-pay | Admitting: *Deleted

## 2017-04-13 DIAGNOSIS — Z95811 Presence of heart assist device: Secondary | ICD-10-CM

## 2017-04-14 ENCOUNTER — Telehealth (HOSPITAL_COMMUNITY): Payer: Self-pay | Admitting: *Deleted

## 2017-04-14 ENCOUNTER — Ambulatory Visit (HOSPITAL_COMMUNITY)
Admission: RE | Admit: 2017-04-14 | Discharge: 2017-04-14 | Disposition: A | Discharge status: Home / Self Care | Payer: Medicare Other | Source: Ambulatory Visit | Attending: Internal Medicine | Admitting: Internal Medicine

## 2017-04-14 ENCOUNTER — Ambulatory Visit (HOSPITAL_COMMUNITY): Payer: Self-pay | Admitting: Pharmacist

## 2017-04-14 DIAGNOSIS — Z95811 Presence of heart assist device: Secondary | ICD-10-CM | POA: Diagnosis not present

## 2017-04-14 LAB — LACTATE DEHYDROGENASE: LDH: 219 U/L — ABNORMAL HIGH (ref 98–192)

## 2017-04-14 LAB — PROTIME-INR
INR: 2.61
Prothrombin Time: 28.5 seconds — ABNORMAL HIGH (ref 11.4–15.2)

## 2017-04-14 NOTE — Telephone Encounter (Signed)
Spoke with [name] regarding the patients lab values including LDH and INR. Stable at this point. No questions at this time. Will follow up with  LDH level at his next clinic visit in one month.  Balinda Quails RN, VAD Coordinator 24/7 pager [phone]

## 2017-04-22 ENCOUNTER — Ambulatory Visit (HOSPITAL_COMMUNITY): Payer: Self-pay | Admitting: Pharmacist

## 2017-04-22 DIAGNOSIS — Z7901 Long term (current) use of anticoagulants: Secondary | ICD-10-CM | POA: Diagnosis not present

## 2017-04-22 LAB — POCT INR: INR: 3.5

## 2017-04-29 ENCOUNTER — Ambulatory Visit (HOSPITAL_COMMUNITY): Payer: Self-pay | Admitting: Pharmacist

## 2017-04-29 LAB — POCT INR: INR: 3.1

## 2017-05-06 ENCOUNTER — Ambulatory Visit (HOSPITAL_COMMUNITY): Payer: Self-pay | Admitting: Pharmacist

## 2017-05-06 LAB — POCT INR: INR: 2.9

## 2017-05-07 ENCOUNTER — Other Ambulatory Visit (INDEPENDENT_AMBULATORY_CARE_PROVIDER_SITE_OTHER): Payer: Medicare Other

## 2017-05-07 DIAGNOSIS — E890 Postprocedural hypothyroidism: Secondary | ICD-10-CM

## 2017-05-07 LAB — T4, FREE: Free T4: 1.25 ng/dL (ref 0.60–1.60)

## 2017-05-07 LAB — TSH: TSH: 4 u[IU]/mL (ref 0.35–4.50)

## 2017-05-10 ENCOUNTER — Encounter: Payer: Self-pay | Admitting: Endocrinology

## 2017-05-10 ENCOUNTER — Ambulatory Visit (INDEPENDENT_AMBULATORY_CARE_PROVIDER_SITE_OTHER): Payer: Medicare Other | Admitting: Endocrinology

## 2017-05-10 VITALS — BP 96/68 | HR 69 | Ht 67.0 in | Wt 165.0 lb

## 2017-05-10 DIAGNOSIS — I255 Ischemic cardiomyopathy: Secondary | ICD-10-CM

## 2017-05-10 DIAGNOSIS — E890 Postprocedural hypothyroidism: Secondary | ICD-10-CM

## 2017-05-10 NOTE — Patient Instructions (Addendum)
Take 1 1/2 pills 2x weekly  Check cost of UNITHROID

## 2017-05-10 NOTE — Progress Notes (Signed)
Patient ID: Johnny Oneill, male   DOB: 1940-11-14, 76 y.o.   MRN: [id number]   Reason for Appointment:  Hypothyroidism, followup visit    History of Present Illness:   The hypothyroidism was first diagnosed after radioactive iodine treatment for [name] several years ago  Previously he had lost significant amount of weight with his worsening heart failure and subsequently this stabilized More recently his weight has improved somewhat and is stable.  Previously was taking 200 mcg daily since 3/15 when he was hospitalized  Subsequently he was on steady dose of 175 mcg since 11/2013 Subsequently the dose was reduced down to 150 g in 2016   Current regimen: he has been taking 125 g, 7-1/2 tablets weekly With this his TSH levels have been generally fairly stable in the normal range although trending higher He does feel fairly well overall with no unusual fatigue, significant weight change or cold intolerance  He gets his prescription from [facility] consistently, he does not know what brand of generic he gets  Compliance with the medical regimen has been as prescribed with taking the tablet in the morning before breakfast.  Does not take any vitamins with this   His TSH is again normal although trending higher at 4.0  Wt Readings from Last 3 Encounters:  05/10/17 165 lb (74.8 kg)  03/16/17 166 lb (75.3 kg)  01/22/17 159 lb 4.8 oz (72.3 kg)    Lab Results  Component Value Date   TSH 4.00 05/07/2017   TSH 3.82 10/30/2016   TSH 1.95 07/03/2016   FREET4 1.25 05/07/2017   FREET4 1.27 10/30/2016   FREET4 1.11 07/03/2016      Allergies as of 05/10/2017      Reactions   Ace Inhibitors    Hypotension and elevated creatinine   Diovan [valsartan] Other (See Comments)   Hypotension at low dose, hyperkalemia   Lipitor [atorvastatin Calcium] Other (See Comments)   Muscle pain   Norvasc [amlodipine] Other (See Comments)   Put patient to sleep      Medication  List       Accurate as of 05/10/17 10:16 AM. Always use your most recent med list.          acetaminophen 500 MG chewable tablet Commonly known as:  TYLENOL Chew 500 mg by mouth every 6 (six) hours as needed for pain.   aspirin EC 325 MG tablet Take 325 mg by mouth daily.   citalopram 10 MG tablet Commonly known as:  CELEXA Take 1 tablet (10 mg total) by mouth daily.   CoQ10 200 MG Caps Take 200 mg by mouth daily.   furosemide 20 MG tablet Commonly known as:  LASIX Take 20 mg by mouth daily as needed (Take 20 mg as daily as needed for wt > 153 lbs). Reported on 01/14/2016   hydrALAZINE 50 MG tablet Commonly known as:  APRESOLINE Take 1 tablet (50 mg total) by mouth 3 (three) times daily.   levothyroxine 125 MCG tablet Commonly known as:  SYNTHROID, LEVOTHROID Take 125-187.5 mcg by mouth daily before breakfast. Take 125 mcg (1 tablet) daily except 187.5 mcg (1 and 1/2 tablets) on Sunday   levothyroxine 125 MCG tablet Commonly known as:  SYNTHROID, LEVOTHROID TAKE ONE TABLET BY MOUTH ONCE DAILY   Melatonin 3 MG Tabs Take 1 tablet by mouth at bedtime.   pantoprazole 40 MG tablet Commonly known as:  PROTONIX TAKE ONE TABLET BY MOUTH ONCE DAILY   potassium chloride  SA 20 MEQ tablet Commonly known as:  K-DUR,KLOR-CON Take 1 tablet (20 mEq total) by mouth daily as needed. Take one potassium pill when you take your lasix.   PROBIOTIC PO Take 1 capsule by mouth daily.   ranitidine 150 MG tablet Commonly known as:  ZANTAC Take 150 mg by mouth daily as needed for heartburn.   rosuvastatin 20 MG tablet Commonly known as:  CRESTOR Take 1 tablet (20 mg total) by mouth daily.   spironolactone 25 MG tablet Commonly known as:  ALDACTONE Take 1 tablet (25 mg total) by mouth daily.   temazepam 15 MG capsule Commonly known as:  RESTORIL Take 1 capsule (15 mg total) by mouth at bedtime.   warfarin 5 MG tablet Commonly known as:  COUMADIN Take 7.5 mg (1 and 1/2 tablets)  daily except 10 mg (2 tablets) on Tues, Sat, Sun        Past Medical History:  Diagnosis Date  . Anxiety   . Automatic implantable cardioverter-defibrillator in situ   . CAD (coronary artery disease)    S/P stenting of LCX in 2004;  s/p Lat MI 2009 with occlusion of the LCX - treated with Promus stenting. Has total occlusion of the RCA.   [name] CHF (congestive heart failure) ([name])   . Hypercholesteremia   . Hypertension   . Hypothyroidism   . ICD (implantable cardiac defibrillator) in place    PROPHYLACTIC      medtronic  . Inferior MI ([name]) "? date"; 2009  . Ischemic cardiomyopathy    a. 10/09 Echo: Sev LV Dysfxn, inf/lat AK, Mod MR.  [name] LVAD (left ventricular assist device) present ([name])   . Osteoarthritis    a. s/p R TKR  . Pacemaker   . PVC (premature ventricular contraction)   . RBBB   . Shortness of breath     Past Surgical History:  Procedure Laterality Date  . CARPAL TUNNEL RELEASE Right 05/29/2016   Procedure: CARPAL TUNNEL RELEASE;  Surgeon: Earlie Server, MD;  Location: [facility];  Service: Orthopedics;  Laterality: Right;  . CORONARY ANGIOPLASTY WITH STENT PLACEMENT  2004  . CORONARY ANGIOPLASTY WITH STENT PLACEMENT  07/2008  . DEFIBRILLATOR  2009  . ESOPHAGOGASTRODUODENOSCOPY N/A 09/15/2013   Procedure: ESOPHAGOGASTRODUODENOSCOPY (EGD);  Surgeon: Ladene Artist, MD;  Location: [facility] ENDOSCOPY;  Service: Endoscopy;  Laterality: N/A;  bedside  . HEMATOMA EVACUATION Right 11/06/2013   Procedure: EVACUATION HEMATOMA;  Surgeon: Gaye Pollack, MD;  Location: [facility];  Service: Cardiothoracic;  Laterality: Right;  . INSERT / REPLACE / REMOVE PACEMAKER  2009  . INSERTION OF IMPLANTABLE LEFT VENTRICULAR ASSIST DEVICE N/A 09/18/2013   Procedure: INSERTION OF IMPLANTABLE LEFT VENTRICULAR ASSIST DEVICE;  Surgeon: Ivin Poot, MD;  Location: [facility];  Service: Open Heart Surgery;  Laterality: N/A;  CIRC ARREST  NITRIC OXIDE  . INTRA-AORTIC BALLOON PUMP INSERTION N/A 09/14/2013    Procedure: INTRA-AORTIC BALLOON PUMP INSERTION;  Surgeon: Jolaine Artist, MD;  [facility] C[facility] LAB;  Service: Cardiovascular;  Laterality: N/A;  . INTRAOPERATIVE TRANSESOPHAGEAL ECHOCARDIOGRAM N/A 09/18/2013   Procedure: INTRAOPERATIVE TRANSESOPHAGEAL ECHOCARDIOGRAM;  Surgeon: Ivin Poot, MD;  Location: [facility];  Service: Open Heart Surgery;  Laterality: N/A;  . KNEE ARTHROSCOPY     bilaterally  . KNEE ARTHROSCOPY W/ PARTIAL MEDIAL MENISCECTOMY  12/2002   left  . LEFT VENTRICULAR ASSIST DEVICE    . RIGHT HEART CATHETERIZATION N/A 08/25/2013   Procedure: RIGHT HEART CATH;  Surgeon: Jolaine Artist, MD;  Location: [facility] CATH LAB;  Service: Cardiovascular;  Laterality: N/A;  . RIGHT HEART CATHETERIZATION N/A 09/13/2013   Procedure: RIGHT HEART CATH;  Surgeon: Jolaine Artist, MD;  Location: [facility] CATH LAB;  Service: Cardiovascular;  Laterality: N/A;  . RIGHT HEART CATHETERIZATION N/A 02/08/2014   Procedure: RIGHT HEART CATH;  Surgeon: Jolaine Artist, MD;  Location: [facility] CATH LAB;  Service: Cardiovascular;  Laterality: N/A;  . RIGHT HEART CATHETERIZATION N/A 02/12/2014   Procedure: RIGHT HEART CATH;  Surgeon: Jolaine Artist, MD;  Location[facility] C[facility] LAB;  Service: Cardiovascular;  Laterality: N/A;  . TOTAL KNEE ARTHROPLASTY  11/27/11   left  . TOTAL KNEE ARTHROPLASTY  11/27/2011   Procedure: TOTAL KNEE ARTHROPLASTY;  Surgeon: Yvette Rack., MD;  Location: [facility];  Service: Orthopedics;  Laterality: Left;  left total knee arthroplasty  . VIDEO ASSISTED THORACOSCOPY Right 11/06/2013   Procedure: VIDEO ASSISTED THORACOSCOPY;  Surgeon: Gaye Pollack, MD;  Location: [facility] OR;  Service: Cardiothoracic;  Laterality: Right;    Family History  Problem Relation Age of Onset  . Heart failure Father   . Stroke Father   . Coronary artery disease Mother   . Heart disease Mother   . Hyperlipidemia Sister     Social History:  reports that he has never smoked. He has never used smokeless tobacco. He  reports that he does not drink alcohol or use drugs.  Allergies:  Allergies  Allergen Reactions  . Ace Inhibitors     Hypotension and elevated creatinine  . Diovan [Valsartan] Other (See Comments)    Hypotension at low dose, hyperkalemia  . Lipitor [Atorvastatin Calcium] Other (See Comments)    Muscle pain  . Norvasc [Amlodipine] Other (See Comments)    Put patient to sleep   ROS:  Cardiomyopathy: He is using a ventricle assist device and anticoagulation  Appettite is normal, weight is stable  Wt Readings from Last 3 Encounters:  05/10/17 165 lb (74.8 kg)  03/16/17 166 lb (75.3 kg)  01/22/17 159 lb 4.8 oz (72.3 kg)      Examination:   BP 96/68   Pulse 69   Ht 5\' 7"  (1.702 m)   Wt 165 lb (74.8 kg)   SpO2 98%   BMI 25.84 kg/m     He looks well Biceps reflexes appear normal     Assessment/Plan:   Post ablative Hypothyroidism:  He is feeling fairly good with his current thyroxine replacement dosage of 125 g, 7-1/2 tablets a week  However TSH is again trending higher, previously in 2016 and 17 he was requiring higher doses  He is looks euthyroid  He will now go to the 137 g dose.  Also he can find out if Unithroid will be covered by his insurance  He will follow-up in 3 months  Patient Instructions  Take 1 1/2 pills 2x weekly  Check cost of UNITHROID     [facility] 05/10/2017, 10:16 AM

## 2017-05-11 MED ORDER — LEVOTHYROXINE SODIUM 137 MCG PO TABS: 137.0000 ug | ORAL_TABLET | Freq: Every day | ORAL | 3 refills | Status: DC

## 2017-05-12 ENCOUNTER — Telehealth (HOSPITAL_COMMUNITY): Payer: Self-pay | Admitting: Surgery

## 2017-05-12 ENCOUNTER — Telehealth (HOSPITAL_COMMUNITY): Payer: Self-pay | Admitting: *Deleted
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
## 2017-05-13 ENCOUNTER — Ambulatory Visit (HOSPITAL_COMMUNITY): Payer: Self-pay | Admitting: Pharmacist

## 2017-05-13 LAB — POCT INR: INR: 2.5

## 2017-05-17 ENCOUNTER — Other Ambulatory Visit (HOSPITAL_COMMUNITY): Payer: Self-pay | Admitting: *Deleted

## 2017-05-17 DIAGNOSIS — Z95811 Presence of heart assist device: Secondary | ICD-10-CM

## 2017-05-18 ENCOUNTER — Ambulatory Visit (HOSPITAL_COMMUNITY)
Admission: RE | Admit: 2017-05-18 | Discharge: 2017-05-18 | Disposition: A | Discharge status: Home / Self Care | Payer: Medicare Other | Source: Ambulatory Visit | Attending: Cardiology | Admitting: Cardiology

## 2017-05-18 ENCOUNTER — Ambulatory Visit (HOSPITAL_COMMUNITY): Payer: Self-pay | Admitting: Pharmacist

## 2017-05-18 DIAGNOSIS — E039 Hypothyroidism, unspecified: Secondary | ICD-10-CM | POA: Insufficient documentation

## 2017-05-18 DIAGNOSIS — I110 Hypertensive heart disease with heart failure: Secondary | ICD-10-CM | POA: Diagnosis not present

## 2017-05-18 DIAGNOSIS — Z5181 Encounter for therapeutic drug level monitoring: Secondary | ICD-10-CM | POA: Diagnosis not present

## 2017-05-18 DIAGNOSIS — M1990 Unspecified osteoarthritis, unspecified site: Secondary | ICD-10-CM | POA: Insufficient documentation

## 2017-05-18 DIAGNOSIS — F419 Anxiety disorder, unspecified: Secondary | ICD-10-CM | POA: Diagnosis not present

## 2017-05-18 DIAGNOSIS — Z7982 Long term (current) use of aspirin: Secondary | ICD-10-CM | POA: Insufficient documentation

## 2017-05-18 DIAGNOSIS — Z95811 Presence of heart assist device: Secondary | ICD-10-CM

## 2017-05-18 DIAGNOSIS — R740 Nonspecific elevation of levels of transaminase and lactic acid dehydrogenase [LDH]: Secondary | ICD-10-CM | POA: Insufficient documentation

## 2017-05-18 DIAGNOSIS — I5022 Chronic systolic (congestive) heart failure: Secondary | ICD-10-CM

## 2017-05-18 DIAGNOSIS — Z950 Presence of cardiac pacemaker: Secondary | ICD-10-CM | POA: Diagnosis not present

## 2017-05-18 DIAGNOSIS — I255 Ischemic cardiomyopathy: Secondary | ICD-10-CM | POA: Diagnosis not present

## 2017-05-18 DIAGNOSIS — I252 Old myocardial infarction: Secondary | ICD-10-CM | POA: Insufficient documentation

## 2017-05-18 DIAGNOSIS — Z7901 Long term (current) use of anticoagulants: Secondary | ICD-10-CM | POA: Insufficient documentation

## 2017-05-18 DIAGNOSIS — Z96651 Presence of right artificial knee joint: Secondary | ICD-10-CM | POA: Insufficient documentation

## 2017-05-18 DIAGNOSIS — E7800 Pure hypercholesterolemia, unspecified: Secondary | ICD-10-CM | POA: Diagnosis not present

## 2017-05-18 DIAGNOSIS — Z955 Presence of coronary angioplasty implant and graft: Secondary | ICD-10-CM | POA: Diagnosis not present

## 2017-05-18 DIAGNOSIS — I10 Essential (primary) hypertension: Secondary | ICD-10-CM | POA: Diagnosis not present

## 2017-05-18 DIAGNOSIS — I2582 Chronic total occlusion of coronary artery: Secondary | ICD-10-CM | POA: Diagnosis not present

## 2017-05-18 DIAGNOSIS — I4510 Unspecified right bundle-branch block: Secondary | ICD-10-CM | POA: Insufficient documentation

## 2017-05-18 DIAGNOSIS — Z95810 Presence of automatic (implantable) cardiac defibrillator: Secondary | ICD-10-CM | POA: Diagnosis not present

## 2017-05-18 DIAGNOSIS — I2510 Atherosclerotic heart disease of native coronary artery without angina pectoris: Secondary | ICD-10-CM | POA: Insufficient documentation

## 2017-05-18 LAB — BASIC METABOLIC PANEL
Anion gap: 5 (ref 5–15)
BUN: 18 mg/dL (ref 6–20)
CO2: 27 mmol/L (ref 22–32)
Calcium: 9.2 mg/dL (ref 8.9–10.3)
Chloride: 104 mmol/L (ref 101–111)
Creatinine, Ser: 1.45 mg/dL — ABNORMAL HIGH (ref 0.61–1.24)
GFR calc Af Amer: 54 mL/min — ABNORMAL LOW (ref >60)
GFR calc non Af Amer: 46 mL/min — ABNORMAL LOW (ref >60)
Glucose, Bld: 103 mg/dL — ABNORMAL HIGH (ref 65–99)
Potassium: 4.2 mmol/L (ref 3.5–5.1)
Sodium: 136 mmol/L (ref 135–145)

## 2017-05-18 LAB — CBC
HCT: 45.8 % (ref 39.0–52.0)
Hemoglobin: 14.6 g/dL (ref 13.0–17.0)
MCH: 29.1 pg (ref 26.0–34.0)
MCHC: 31.9 g/dL (ref 30.0–36.0)
MCV: 91.2 fL (ref 78.0–100.0)
Platelets: 159 10*3/uL (ref 150–400)
RBC: 5.02 MIL/uL (ref 4.22–5.81)
RDW: 15.3 % (ref 11.5–15.5)
WBC: 6.1 10*3/uL (ref 4.0–10.5)

## 2017-05-18 LAB — TSH: TSH: 2.986 u[IU]/mL (ref 0.350–4.500)

## 2017-05-18 LAB — LACTATE DEHYDROGENASE: LDH: 219 U/L — ABNORMAL HIGH (ref 98–192)

## 2017-05-18 LAB — PROTIME-INR
INR: 2.16
Prothrombin Time: 23.9 seconds — ABNORMAL HIGH (ref 11.4–15.2)

## 2017-05-18 NOTE — Progress Notes (Signed)
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   Admitted 3/4-3/25/15 with SOB and syncopal episode. Found to have SBO and NG placed and started on TPN. On 11/06/13 developed progressive SOB and hypotension and co-ox 51% and Hgb 6.7. Started on milrinone and transfused. Echo showed nl RV function. Patient had PEA arrest and was intubated and started on pressors. CXR revealed a R hemothorax and a R CT placed. He required VATs and evacuation of hemothorax and was then tansferred to CIR.   LVAD Issues  Elevated LDH--bivalirudin. D/C LDH 188  Today he returns for routine  LVAD follow up. Overall feeling fine. Denies SOB/PND/Orthopnea. Appetite ok. No fever or chills. Weight at home has been stable. No BRBPR. No fevern or chills.   Taking all medications  Echo 3/18 EF 10-15%. RV moderate HK. LVAD cannula ok. Mild AI  Echo 9/16 EF 20%, LVAD cannula ok moderate RV dilation with mild to moderately decreased RV systolic function.   VAD Indication: DT    VAD interrogation & Equipment Management: Speed:9200 Flow: 4.7 Power:5.3 PI: 6.3  Alarms: no clinical alarms Events: 6-10 PI events daily Fixed speed 9200 Low speed limit: 8600  Primary Controller: Replace back up battery in 80months.    Past Medical History:  Diagnosis Date  . Anxiety   . Automatic implantable cardioverter-defibrillator in situ   . CAD (coronary artery disease)    S/P stenting of LCX in 2004;  s/p Lat MI 2009 with occlusion of the LCX - treated with Promus stenting. Has total occlusion of the RCA.   [name] CHF (congestive heart failure) ([place])   . Hypercholesteremia   . Hypertension   . Hypothyroidism   . ICD (implantable cardiac defibrillator) in place    PROPHYLACTIC      medtronic  . Inferior MI ([place]) "? date"; 2009  . Ischemic cardiomyopathy    a. 10/09 Echo: Sev LV  Dysfxn, inf/lat AK, Mod MR.  [name] LVAD (left ventricular assist device) present ([place])   . Osteoarthritis    a. s/p R TKR  . Pacemaker   . PVC (premature ventricular contraction)   . RBBB   . Shortness of breath     Current Outpatient Prescriptions  Medication Sig Dispense Refill  . acetaminophen (TYLENOL) 500 MG chewable tablet Chew 500 mg by mouth every 6 (six) hours as needed for pain.    [name] aspirin EC 325 MG tablet Take 325 mg by mouth daily.    . citalopram (CELEXA) 10 MG tablet Take 1 tablet (10 mg total) by mouth daily. 90 tablet 3  . Coenzyme Q10 (COQ10) 200 MG CAPS Take 200 mg by mouth daily.    . furosemide (LASIX) 20 MG tablet Take 20 mg by mouth daily as needed (Take 20 mg as daily as needed for wt > 153 lbs). Reported on 01/14/2016    . hydrALAZINE (APRESOLINE) 50 MG tablet Take 1 tablet (50 mg total) by mouth 3 (three) times daily. 270 tablet 3  . levothyroxine (SYNTHROID) 137 MCG tablet Take 1 tablet (137 mcg total) by mouth daily before breakfast. 30 tablet 3  . Melatonin 3 MG TABS Take 1 tablet by mouth at bedtime.    . pantoprazole (PROTONIX) 40 MG tablet TAKE ONE TABLET BY MOUTH ONCE DAILY 90 tablet 3  . potassium chloride SA (K-DUR,KLOR-CON) 20 MEQ tablet  Take 1 tablet (20 mEq total) by mouth daily as needed. Take one potassium pill when you take your lasix. 90 tablet 3  . Probiotic Product (PROBIOTIC PO) Take 1 capsule by mouth daily.    . ranitidine (ZANTAC) 150 MG tablet Take 150 mg by mouth daily as needed for heartburn.    . rosuvastatin (CRESTOR) 20 MG tablet Take 1 tablet (20 mg total) by mouth daily. 90 tablet 3  . spironolactone (ALDACTONE) 25 MG tablet Take 1 tablet (25 mg total) by mouth daily. 30 tablet 3  . temazepam (RESTORIL) 15 MG capsule Take 1 capsule (15 mg total) by mouth at bedtime. (Patient taking differently: Take 15 mg by mouth every other day as needed for sleep. ) 30 capsule 0  . warfarin (COUMADIN) 5 MG tablet Take 7.5 mg (1 and 1/2 tablets) daily  except 10 mg (2 tablets) on Tues, Sat, Sun     No current facility-administered medications for this encounter.    Facility-Administered Medications Ordered in Other Encounters  Medication Dose Route Frequency Provider Last Rate Last Dose  . etomidate (AMIDATE) injection    Anesthesia Intra-op Myna Bright, CRNA   20 mg at 02/08/14 0915  . rocuronium [facility]) injection    Anesthesia Intra-op Myna Bright, CRNA   50 mg at 02/08/14 0932  . succinylcholine (ANECTINE) injection    Anesthesia Intra-op Myna Bright, CRNA   100 mg at 02/08/14 0915    Ace inhibitors; Diovan [valsartan]; Lipitor [atorvastatin calcium]; and Norvasc [amlodipine]  REVIEW OF SYSTEMS: All systems negative except as listed in HPI, PMH and Problem list.   There were no vitals filed for this visit. Vital Signs:  Doppler Pressure  100        Automatc BP: 95/65 (82)  HR 61 SPO2:  92 Weight: 165 pounds  Last weight: 166 lb Home weights: 160 lbs   Vital signs: There were no vitals taken for this visit.   Physical Exam: GENERAL: Well appearing, male who presents to clinic today in no acute distress. HEENT: normal  NECK: Supple, JVP  .  2+ bilaterally, no bruits.  No lymphadenopathy or thyromegaly appreciated.   CARDIAC:  Mechanical heart sounds with LVAD hum present.  LUNGS:  Clear to auscultation bilaterally.  ABDOMEN:  Soft, round, nontender, positive bowel sounds x4.     LVAD exit site: well-healed and incorporated.  Dressing dry and intact.  No erythema or drainage.  Stabilization device present and accurately applied.  Driveline dressing is being changed daily per sterile technique. EXTREMITIES:  Warm and dry, no cyanosis, clubbing, rash or edema  NEUROLOGIC:  Alert and oriented x 4.  Gait steady.  No aphasia.  No dysarthria.  Affect pleasant.      ASSESSMENT AND PLAN:  1) Chronic systolic HF: ICM, EF 16-96% s/p LVAD HM II implant 08/2013 for DT.   - Echo 3/18 EF 10-15% mild AI. Mod  RV dysfunction. LVAD cannula ok. NYHA I. Volume status stable. Continue lasix as needed.   -  He has not tolerated ACE-I or amlodipine in the past. - ICD at [facility] Has seen Dr. Rayann Heman in 2/18 and decided not to replace. Will not replace.   2) LVAD: HMII 08/2013 for DT.  LVAD parameters stable. Admitted in May with elevated LDH with bival.  LDH today 219. Stale.  . Continue aspirin and coumadin.  3) Chronic anticoagulation: INR goal 2.0-3.0.   No bleeding issues.   - INR today 2.6. Stable.  Pharm D addressing. - Continue ASA 325 mg for LVAD + warfarin. Dosing d/w PharmD. 4) HTN:  --Stable.  5) Hypothyroidism:  ---Per Dr Dwyane Dee. TSH 2.9. Followed by Dr Dwyane Dee. Recent synthroid adjustment.  6) Hyperlipidemia:  -- he is on Crestor. Zetia stopped due to cost.  7) CAD --stable. No S/S ischemia.    Follow up in 2 months.  VAD COORDINATOR NOTE  Patient presents for 2 month  follow up in [facility] today. Reports no problems with VAD equipment or concerns with drive line.  Vital Signs:  Doppler Pressure 100              Automatc BP: 95/65 (81) HR:51  SPO2:92  %  Weight: 165 lb w/o eqt Last weight: 166 lb Home weights: 160-165 lbs   VAD Indication: Destination therapy- patient choice    VAD interrogation & Equipment Management: Speed:9200 Flow: 4.1 Power:5.1 w PI:6.4  Alarms: no clinical alarms Events: none  Fixed speed 9200 Low speed limit: 8600  Primary Controller: Replace back up battery in 8months. Back up controller: Replace back up battery in 53months.  Annual Equipment Maintenance on UBC/PM was performed on 08/2016.  I reviewed the LVAD parameters from todayand compared the results to the patient's prior recorded data.LVAD interrogation was NEGATIVEfor significant power changes, NEGATIVEfor clinicalalarms and STABLEfor PI events/speed drops. No programming changes were madeand pump is functioning within specified parameters. Pt is performing  daily controller and system monitor self tests along with completing weekly and monthly maintenance for LVAD equipment.  LVAD equipment check completed and is in good working order. Back-up equipment present. Charged back up battery and performed self-test on equipment.   Exit Site Care: Drive line is being maintained weekly by [name], his wife. Drive line exit site well healed and incorporated. The velour is fully implanted at exit site. Dressing dry and intact. No erythema or drainage. Stabilization device present and accurately applied. Pt denies fever or chills. Pt states they have adequate dressing supplies at home. Patient has CHG allergy.  Significant Events on VAD Support:  10/2013>SBO 12/2016> elevated LDH, admit for Bival  Device:Medtronic ERI Therapies: off Last check: 10/2016   BP &Labs:  MAP100- Doppler is reflecting modified systolic  Hgb 94.7- No S/S of bleeding. Specifically denies melena/BRBPR or nosebleeds.  LDH stable at 219with established baseline of 200- 250. Denies tea-colored urine. No power elevations noted on interrogation.   Plan: 1. Return to clinic in 2 months   Balinda Quails RN [place] Coordinator   Office: [phone] 24/7 Emergency VAD Pager: [place] 2:11 PM

## 2017-05-18 NOTE — Patient Instructions (Signed)
Return to clinic in 2 months  Increase your warfarin to 2 tablets daily except for 1.5 tablets on Monday and Friday.

## 2017-05-18 NOTE — Progress Notes (Signed)
Patient presents for 2 month  follow up in [facility] today. Reports no problems with VAD equipment or concerns with drive line.  Vital Signs:  Doppler Pressure 100   Automatc BP: 95/65 (81) HR:51   SPO2:92  %  Weight: 165 lb w/o eqt Last weight: 166 lb Home weights: 160-165 lbs   VAD Indication: Destination therapy- patient choice    VAD interrogation & Equipment Management: Speed:9200 Flow: 4.1 Power:5.1 w    PI:6.4  Alarms: no clinical alarms Events: none  Fixed speed 9200 Low speed limit: 8600  Primary Controller:  Replace back up battery in 45months. Back up controller:   Replace back up battery in 12 months.  Annual Equipment Maintenance on UBC/PM was performed on 08/2016.   I reviewed the LVAD parameters from today and compared the results to the patient's prior recorded data. LVAD interrogation was NEGATIVE for significant power changes, NEGATIVE for clinical alarms and STABLE for PI events/speed drops. No programming changes were made and pump is functioning within specified parameters. Pt is performing daily controller and system monitor self tests along with completing weekly and monthly maintenance for LVAD equipment.  LVAD equipment check completed and is in good working order. Back-up equipment present. Charged back up battery and performed self-test on equipment.   Exit Site Care: Drive line is being maintained weekly  by [name], his wife. Drive line exit site well healed and incorporated. The velour is fully implanted at exit site. Dressing dry and intact. No erythema or drainage. Stabilization device present and accurately applied. Pt denies fever or chills. Pt states they have adequate dressing supplies at home. Patient has CHG allergy.  Significant Events on VAD Support:  10/2013>SBO 12/2016> elevated LDH, admit for Bival  Device:Medtronic ERI Therapies: off Last check: 10/2016   BP & Labs:  MAP100 - Doppler is reflecting modified  systolic  Hgb 61.2 - No S/S of bleeding. Specifically denies melena/BRBPR or nosebleeds.  LDH stable at 219 with established baseline of 200- 250. Denies tea-colored urine. No power elevations noted on interrogation.   Plan: 1. Return to clinic in 2 months   Balinda Quails RN [place] Coordinator   Office: [phone] 24/7 Emergency [place] Pager: [phone]

## 2017-05-20 DIAGNOSIS — Z7901 Long term (current) use of anticoagulants: Secondary | ICD-10-CM | POA: Diagnosis not present

## 2017-05-24 ENCOUNTER — Telehealth (HOSPITAL_COMMUNITY): Payer: Self-pay | Admitting: *Deleted

## 2017-05-24 ENCOUNTER — Other Ambulatory Visit (HOSPITAL_COMMUNITY): Payer: Self-pay | Admitting: Unknown Physician Specialty

## 2017-05-24 DIAGNOSIS — Z7901 Long term (current) use of anticoagulants: Secondary | ICD-10-CM

## 2017-05-24 DIAGNOSIS — Z95811 Presence of heart assist device: Secondary | ICD-10-CM

## 2017-05-24 NOTE — Telephone Encounter (Signed)
Patient made aware of [employer] Medical Device Correction which applies to [employer] XS PT test strips that are used with all [employer] patient self-testing instruments. Plans made to have INR checked at our clinic or local laboratory until replacement test strips are delivered. Informed patient that md-INR will be contacting them with further details and instructions.   Bodee Lafoe RN, VAD Coordinator 24/7 pager [phone]   

## 2017-05-27 ENCOUNTER — Ambulatory Visit (HOSPITAL_COMMUNITY)
Admission: RE | Admit: 2017-05-27 | Discharge: 2017-05-27 | Disposition: A | Discharge status: Home / Self Care | Payer: Medicare Other | Source: Ambulatory Visit | Attending: Internal Medicine | Admitting: Internal Medicine

## 2017-05-27 ENCOUNTER — Ambulatory Visit (HOSPITAL_COMMUNITY): Payer: Self-pay | Admitting: Pharmacist

## 2017-05-27 DIAGNOSIS — Z95811 Presence of heart assist device: Secondary | ICD-10-CM | POA: Diagnosis not present

## 2017-05-27 DIAGNOSIS — Z7901 Long term (current) use of anticoagulants: Secondary | ICD-10-CM | POA: Diagnosis not present

## 2017-05-27 LAB — PROTIME-INR
INR: 2.76
Prothrombin Time: 28.9 seconds — ABNORMAL HIGH (ref 11.4–15.2)

## 2017-06-03 ENCOUNTER — Ambulatory Visit (HOSPITAL_COMMUNITY): Payer: Self-pay | Admitting: Pharmacist

## 2017-06-03 LAB — POCT INR: INR: 4

## 2017-06-10 ENCOUNTER — Other Ambulatory Visit (HOSPITAL_COMMUNITY): Payer: Self-pay | Admitting: *Deleted

## 2017-06-10 ENCOUNTER — Ambulatory Visit (HOSPITAL_COMMUNITY)
Admission: RE | Admit: 2017-06-10 | Discharge: 2017-06-10 | Disposition: A | Discharge status: Home / Self Care | Payer: Medicare Other | Source: Ambulatory Visit | Attending: Cardiology | Admitting: Cardiology

## 2017-06-10 ENCOUNTER — Ambulatory Visit (HOSPITAL_COMMUNITY): Payer: Self-pay | Admitting: Pharmacist

## 2017-06-10 DIAGNOSIS — Z7901 Long term (current) use of anticoagulants: Secondary | ICD-10-CM | POA: Diagnosis not present

## 2017-06-10 DIAGNOSIS — Z95811 Presence of heart assist device: Secondary | ICD-10-CM

## 2017-06-10 LAB — PROTIME-INR
INR: 3.19
Prothrombin Time: 32.4 seconds — ABNORMAL HIGH (ref 11.4–15.2)

## 2017-06-17 DIAGNOSIS — Z7901 Long term (current) use of anticoagulants: Secondary | ICD-10-CM | POA: Diagnosis not present

## 2017-06-17 LAB — POCT INR: INR: 3.7

## 2017-06-18 ENCOUNTER — Ambulatory Visit (HOSPITAL_COMMUNITY): Payer: Self-pay | Admitting: Pharmacist

## 2017-06-18 DIAGNOSIS — Z95811 Presence of heart assist device: Secondary | ICD-10-CM

## 2017-06-21 ENCOUNTER — Ambulatory Visit (HOSPITAL_COMMUNITY)
Admission: RE | Admit: 2017-06-21 | Discharge: 2017-06-21 | Disposition: A | Discharge status: Home / Self Care | Payer: Medicare Other | Source: Ambulatory Visit | Attending: Internal Medicine | Admitting: Internal Medicine

## 2017-06-21 ENCOUNTER — Ambulatory Visit (HOSPITAL_COMMUNITY): Payer: Self-pay | Admitting: Pharmacist

## 2017-06-21 DIAGNOSIS — Z95811 Presence of heart assist device: Secondary | ICD-10-CM

## 2017-06-21 LAB — PROTIME-INR
INR: 2.02
Prothrombin Time: 22.7 seconds — ABNORMAL HIGH (ref 11.4–15.2)

## 2017-06-24 ENCOUNTER — Other Ambulatory Visit (HOSPITAL_COMMUNITY): Payer: Medicare Other

## 2017-06-24 DIAGNOSIS — Z7901 Long term (current) use of anticoagulants: Secondary | ICD-10-CM | POA: Diagnosis not present

## 2017-07-01 ENCOUNTER — Ambulatory Visit (HOSPITAL_COMMUNITY)
Admission: RE | Admit: 2017-07-01 | Discharge: 2017-07-01 | Disposition: A | Discharge status: Home / Self Care | Payer: Medicare Other | Source: Ambulatory Visit | Attending: Internal Medicine | Admitting: Internal Medicine

## 2017-07-01 ENCOUNTER — Ambulatory Visit (HOSPITAL_COMMUNITY): Payer: Self-pay | Admitting: Pharmacist

## 2017-07-01 ENCOUNTER — Encounter (HOSPITAL_COMMUNITY): Payer: Self-pay | Admitting: *Deleted

## 2017-07-01 ENCOUNTER — Telehealth (HOSPITAL_COMMUNITY): Payer: Self-pay | Admitting: *Deleted

## 2017-07-01 DIAGNOSIS — Z95811 Presence of heart assist device: Secondary | ICD-10-CM | POA: Diagnosis not present

## 2017-07-01 LAB — PROTIME-INR
INR: 2.38
Prothrombin Time: 25.8 seconds — ABNORMAL HIGH (ref 11.4–15.2)

## 2017-07-01 NOTE — Addendum Note (Signed)
Addended by: Adora Fridge on: 07/01/2017 12:01 PM   Modules accepted: Orders

## 2017-07-01 NOTE — Telephone Encounter (Signed)
Order to d/c home INR monitor sent to [facility] per Vernard Gambles PharmD. Results from venous stick compared to home INR results with following findings. Per VAD team, if > .5 difference, we will only use venous stick INRs.  07/01/17:  Home monitor:  3.3  Lab stick:  2.38  Pt verbalized understanding of above.

## 2017-07-08 ENCOUNTER — Ambulatory Visit (HOSPITAL_COMMUNITY)
Admission: RE | Admit: 2017-07-08 | Discharge: 2017-07-08 | Disposition: A | Discharge status: Home / Self Care | Payer: Medicare Other | Source: Ambulatory Visit | Attending: Internal Medicine | Admitting: Internal Medicine

## 2017-07-08 ENCOUNTER — Other Ambulatory Visit (HOSPITAL_COMMUNITY): Payer: Medicare Other

## 2017-07-08 ENCOUNTER — Ambulatory Visit (HOSPITAL_COMMUNITY): Payer: Self-pay | Admitting: Pharmacist

## 2017-07-08 DIAGNOSIS — Z48812 Encounter for surgical aftercare following surgery on the circulatory system: Secondary | ICD-10-CM | POA: Insufficient documentation

## 2017-07-08 DIAGNOSIS — Z95811 Presence of heart assist device: Secondary | ICD-10-CM | POA: Diagnosis not present

## 2017-07-08 LAB — PROTIME-INR
INR: 2.95
Prothrombin Time: 30.5 seconds — ABNORMAL HIGH (ref 11.4–15.2)

## 2017-07-13 ENCOUNTER — Other Ambulatory Visit: Payer: Self-pay | Admitting: Cardiology

## 2017-07-15 ENCOUNTER — Other Ambulatory Visit: Payer: Self-pay | Admitting: Cardiology

## 2017-07-16 ENCOUNTER — Other Ambulatory Visit (HOSPITAL_COMMUNITY): Payer: Self-pay | Admitting: Unknown Physician Specialty

## 2017-07-16 DIAGNOSIS — Z95811 Presence of heart assist device: Secondary | ICD-10-CM

## 2017-07-16 DIAGNOSIS — Z7901 Long term (current) use of anticoagulants: Secondary | ICD-10-CM

## 2017-07-19 ENCOUNTER — Ambulatory Visit (HOSPITAL_COMMUNITY)
Admission: RE | Admit: 2017-07-19 | Discharge: 2017-07-19 | Disposition: A | Discharge status: Home / Self Care | Payer: Medicare Other | Source: Ambulatory Visit | Attending: Cardiology | Admitting: Cardiology

## 2017-07-19 ENCOUNTER — Encounter (HOSPITAL_COMMUNITY): Payer: Self-pay | Admitting: Unknown Physician Specialty

## 2017-07-19 ENCOUNTER — Ambulatory Visit (HOSPITAL_COMMUNITY): Payer: Self-pay | Admitting: Pharmacist

## 2017-07-19 ENCOUNTER — Other Ambulatory Visit (HOSPITAL_COMMUNITY): Payer: Self-pay | Admitting: Unknown Physician Specialty

## 2017-07-19 VITALS — BP 82/0 | HR 79 | Ht 69.0 in | Wt 164.0 lb

## 2017-07-19 DIAGNOSIS — E039 Hypothyroidism, unspecified: Secondary | ICD-10-CM | POA: Insufficient documentation

## 2017-07-19 DIAGNOSIS — I2510 Atherosclerotic heart disease of native coronary artery without angina pectoris: Secondary | ICD-10-CM | POA: Diagnosis not present

## 2017-07-19 DIAGNOSIS — I5022 Chronic systolic (congestive) heart failure: Secondary | ICD-10-CM

## 2017-07-19 DIAGNOSIS — Z79899 Other long term (current) drug therapy: Secondary | ICD-10-CM | POA: Diagnosis not present

## 2017-07-19 DIAGNOSIS — I10 Essential (primary) hypertension: Secondary | ICD-10-CM

## 2017-07-19 DIAGNOSIS — I255 Ischemic cardiomyopathy: Secondary | ICD-10-CM | POA: Diagnosis not present

## 2017-07-19 DIAGNOSIS — E785 Hyperlipidemia, unspecified: Secondary | ICD-10-CM | POA: Diagnosis not present

## 2017-07-19 DIAGNOSIS — Z4502 Encounter for adjustment and management of automatic implantable cardiac defibrillator: Secondary | ICD-10-CM | POA: Insufficient documentation

## 2017-07-19 DIAGNOSIS — Z7901 Long term (current) use of anticoagulants: Secondary | ICD-10-CM | POA: Insufficient documentation

## 2017-07-19 DIAGNOSIS — I110 Hypertensive heart disease with heart failure: Secondary | ICD-10-CM | POA: Insufficient documentation

## 2017-07-19 DIAGNOSIS — Z95811 Presence of heart assist device: Secondary | ICD-10-CM | POA: Diagnosis not present

## 2017-07-19 DIAGNOSIS — M1990 Unspecified osteoarthritis, unspecified site: Secondary | ICD-10-CM | POA: Diagnosis not present

## 2017-07-19 DIAGNOSIS — I252 Old myocardial infarction: Secondary | ICD-10-CM | POA: Diagnosis not present

## 2017-07-19 DIAGNOSIS — F419 Anxiety disorder, unspecified: Secondary | ICD-10-CM | POA: Diagnosis not present

## 2017-07-19 DIAGNOSIS — Z7982 Long term (current) use of aspirin: Secondary | ICD-10-CM | POA: Insufficient documentation

## 2017-07-19 LAB — CBC
HCT: 44.5 % (ref 39.0–52.0)
Hemoglobin: 14.6 g/dL (ref 13.0–17.0)
MCH: 29.9 pg (ref 26.0–34.0)
MCHC: 32.8 g/dL (ref 30.0–36.0)
MCV: 91.2 fL (ref 78.0–100.0)
Platelets: 144 10*3/uL — ABNORMAL LOW (ref 150–400)
RBC: 4.88 MIL/uL (ref 4.22–5.81)
RDW: 14.9 % (ref 11.5–15.5)
WBC: 7 10*3/uL (ref 4.0–10.5)

## 2017-07-19 LAB — BASIC METABOLIC PANEL
Anion gap: 7 (ref 5–15)
BUN: 23 mg/dL — ABNORMAL HIGH (ref 6–20)
CO2: 26 mmol/L (ref 22–32)
Calcium: 8.9 mg/dL (ref 8.9–10.3)
Chloride: 105 mmol/L (ref 101–111)
Creatinine, Ser: 1.43 mg/dL — ABNORMAL HIGH (ref 0.61–1.24)
GFR calc Af Amer: 55 mL/min — ABNORMAL LOW (ref >60)
GFR calc non Af Amer: 47 mL/min — ABNORMAL LOW (ref >60)
Glucose, Bld: 86 mg/dL (ref 65–99)
Potassium: 4.2 mmol/L (ref 3.5–5.1)
Sodium: 138 mmol/L (ref 135–145)

## 2017-07-19 LAB — PROTIME-INR
INR: 3.16
Prothrombin Time: 32.2 seconds — ABNORMAL HIGH (ref 11.4–15.2)

## 2017-07-19 LAB — LACTATE DEHYDROGENASE: LDH: 252 U/L — ABNORMAL HIGH (ref 98–192)

## 2017-07-19 MED ORDER — TEMAZEPAM 15 MG PO CAPS: ORAL_CAPSULE | ORAL | 0 refills | Status: DC

## 2017-07-19 NOTE — Progress Notes (Signed)
Patient presents for 2 month  follow up in [facility] today. Reports no problems with VAD equipment or concerns with drive line.  Vital Signs:  Doppler Pressure 82   Automatc BP: 97/72 (84) HR: 79   SPO2:99  %  Weight: 164 lb w/o eqt Last weight: 165 lb Home weights: 160-165 lbs   VAD Indication: Destination therapy- patient choice    VAD interrogation & Equipment Management: Speed:9200 Flow: 5.0 Power:5.4 w    PI:7.0  Alarms: few Low Voltage Events: none  Fixed speed 9200 Low speed limit: 8600  Primary Controller:  Replace back up battery in 10 months. Back up controller:   Replace back up battery in 10 months.  Annual Equipment Maintenance on UBC/PM was performed on 08/2016.   I reviewed the LVAD parameters from today and compared the results to the patient's prior recorded data. LVAD interrogation was NEGATIVE for significant power changes, NEGATIVE for clinical alarms and STABLE for PI events/speed drops. No programming changes were made and pump is functioning within specified parameters. Pt is performing daily controller and system monitor self tests along with completing weekly and monthly maintenance for LVAD equipment.  LVAD equipment check completed and is in good working order. Back-up equipment present. Charged back up battery and performed self-test on equipment.   Exit Site Care: Drive line is being maintained weekly  by [name], his wife. Drive line exit site well healed and incorporated. The velour is fully implanted at exit site. Dressing dry and intact. No erythema or drainage. Stabilization device present and accurately applied. Pt denies fever or chills. Pt states they have adequate dressing supplies at home. Patient has CHG allergy.  Significant Events on VAD Support:  10/2013>SBO 12/2016> elevated LDH, admit for Bival  Device:Medtronic ERI Therapies: off Last check: 10/2016   BP & Labs:  MAP 82 - Doppler is reflecting modified  systolic  Hgb 62.2 - No S/S of bleeding. Specifically denies melena/BRBPR or nosebleeds.  LDH stable at 252 with established baseline of 200- 250. Denies tea-colored urine. No power elevations noted on interrogation.   Plan: 1. Return to clinic in 2 months for annual maintenance and intermacs.   Tanda Rockers RN [place] Coordinator   Office: [phone] 24/7 Emergency VAD Pager: [phone]

## 2017-07-19 NOTE — Progress Notes (Signed)
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   Admitted 3/4-3/25/15 with SOB and syncopal episode. Found to have SBO and NG placed and started on TPN. On 11/06/13 developed progressive SOB and hypotension and co-ox 51% and Hgb 6.7. Started on milrinone and transfused. Echo showed nl RV function. Patient had PEA arrest and was intubated and started on pressors. CXR revealed a R hemothorax and a R CT placed. He required VATs and evacuation of hemothorax and was then tansferred to CIR.   LVAD Issues  Elevated LDH--bivalirudin. D/C LDH 188  Today he returns for routine LVAD follow up. Continues to do very well. Very active without any SOB. Weight stable.Denies orthopnea or PND. No fevers, chills or problems with driveline. No bleeding, melena or neuro symptoms. No VAD alarms. Taking all meds as prescribed.   Echo 3/18 EF 10-15%. RV moderate HK. LVAD cannula ok. Mild AI  Echo 9/16 EF 20%, LVAD cannula ok moderate RV dilation with mild to moderately decreased RV systolic function.     VAD Indication: Destination therapy- patient choice    VAD interrogation & Equipment Management: Speed:9200 Flow: 5.0 Power:5.4 w PI:7.0  Alarms: few Low Voltage Events: none  Fixed speed 9200 Low speed limit: 8600  Primary Controller: Replace back up battery in 10 months. Back up controller: Replace back up battery in 73months.  Annual Equipment Maintenance on UBC/PM was performed on 08/2016.     Past Medical History:  Diagnosis Date  . Anxiety   . Automatic implantable cardioverter-defibrillator in situ   . CAD (coronary artery disease)    S/P stenting of LCX in 2004;  s/p Lat MI 2009 with occlusion of the LCX - treated with Promus stenting. Has total occlusion of the RCA.   [name] CHF (congestive heart failure) ([place])   . Hypercholesteremia   .  Hypertension   . Hypothyroidism   . ICD (implantable cardiac defibrillator) in place    PROPHYLACTIC      medtronic  . Inferior MI ([place]) "? date"; 2009  . Ischemic cardiomyopathy    a. 10/09 Echo: Sev LV Dysfxn, inf/lat AK, Mod MR.  [name] LVAD (left ventricular assist device) present ([place])   . Osteoarthritis    a. s/p R TKR  . Pacemaker   . PVC (premature ventricular contraction)   . RBBB   . Shortness of breath     Current Outpatient Medications  Medication Sig Dispense Refill  . aspirin EC 325 MG tablet Take 325 mg by mouth daily.    . citalopram (CELEXA) 10 MG tablet Take 1 tablet (10 mg total) by mouth daily. 90 tablet 3  . Coenzyme Q10 (COQ10) 200 MG CAPS Take 200 mg by mouth daily.    . hydrALAZINE (APRESOLINE) 50 MG tablet Take 1 tablet (50 mg total) by mouth 3 (three) times daily. 270 tablet 3  . levothyroxine (SYNTHROID) 137 MCG tablet Take 1 tablet (137 mcg total) by mouth daily before breakfast. (Patient taking differently: Take 137 mcg by mouth daily before breakfast. Take 1 tablet every day except 1&1/2 tablet on wed and sunday) 30 tablet 3  . Melatonin 3 MG TABS Take 1 tablet by mouth at bedtime.    . pantoprazole (PROTONIX) 40 MG tablet TAKE ONE TABLET BY MOUTH ONCE DAILY 90 tablet 3  . Probiotic Product (PROBIOTIC PO) Take 1 capsule  by mouth daily.    . rosuvastatin (CRESTOR) 20 MG tablet Take 1 tablet (20 mg total) by mouth daily. 90 tablet 3  . spironolactone (ALDACTONE) 25 MG tablet Take 1 tablet (25 mg total) by mouth daily. 30 tablet 3  . temazepam (RESTORIL) 15 MG capsule TAKE ONE CAPSULE BY MOUTH ONCE DAILY AT BEDTIME 30 capsule 0  . warfarin (COUMADIN) 5 MG tablet Take 10 mg (2 tablets) daily except 7.5 mg (1 and 1/2 tablets) on Mon/Wed    . acetaminophen (TYLENOL) 500 MG chewable tablet Chew 500 mg by mouth every 6 (six) hours as needed for pain.    . furosemide (LASIX) 20 MG tablet Take 20 mg by mouth daily as needed (Take 20 mg as daily as needed for wt > 153  lbs). Reported on 01/14/2016    . potassium chloride SA (K-DUR,KLOR-CON) 20 MEQ tablet Take 1 tablet (20 mEq total) by mouth daily as needed. Take one potassium pill when you take your lasix. (Patient not taking: Reported on 07/19/2017) 90 tablet 3  . ranitidine (ZANTAC) 150 MG tablet Take 150 mg by mouth daily as needed for heartburn.     No current facility-administered medications for this encounter.    Facility-Administered Medications Ordered in Other Encounters  Medication Dose Route Frequency Provider Last Rate Last Dose  . etomidate (AMIDATE) injection    Anesthesia Intra-op Myna Bright, CRNA   20 mg at 02/08/14 0915  . rocuronium [facility]) injection    Anesthesia Intra-op Myna Bright, CRNA   50 mg at 02/08/14 0932  . succinylcholine (ANECTINE) injection    Anesthesia Intra-op Myna Bright, CRNA   100 mg at 02/08/14 0915    Ace inhibitors; Diovan [valsartan]; Lipitor [atorvastatin calcium]; and Norvasc [amlodipine]  REVIEW OF SYSTEMS: All systems negative except as listed in HPI, PMH and Problem list.   Vitals:   07/19/17 1027 07/19/17 1029  BP: 97/72 (!) 82/0  Pulse: 79   SpO2: 99%   Weight: 164 lb (74.4 kg)   Height: 5\' 9"  (1.753 m)    Vital Signs:  Doppler Pressure 82                Automatc BP: 97/72 (84) HR: 79  SPO2:99  %  Weight: 164 lb w/o eqt Last weight: 165 lb Home weights: 160-165 lbs   Vital signs: BP (!) 82/0 Comment: doppler map  Pulse 79   Ht 5\' 9"  (1.753 m)   Wt 164 lb (74.4 kg)   SpO2 99%   BMI 24.22 kg/m    Physical Exam: General:  NAD.  HEENT: normal  Neck: supple. JVP 5-6  Carotids 2+ bilat; no bruits. No lymphadenopathy or thryomegaly appreciated. Cor: LVAD hum.  Lungs: Clear. Abdomen: soft, nontender, non-distended. No hepatosplenomegaly. No bruits or masses. Good bowel sounds. Driveline site clean. Anchor in place.  Extremities: no cyanosis, clubbing, rash. Warm no edema  Neuro: alert & oriented x 3. No focal  deficits. Moves all 4 without problem    ASSESSMENT AND PLAN:  1) Chronic systolic HF: ICM, EF 57-84% s/p LVAD HM II implant 08/2013 for DT.   - Echo 3/18 EF 10-15% mild AI. Mod RV dysfunction. LVAD cannula ok. - Chronic NYHA I with VAD support. Volume status looks good.     -  He has not tolerated ACE-I or amlodipine in the past. - ICD at [facility]. Has seen Dr. Rayann Heman in 2/18 and decided not to replace. Will not replace.   2)  LVAD: HMII 08/2013 for DT.  LVAD parameters stable. - Admitted in May with elevated LDH with bival.  - LDH 252 today. No power spikes. VAD interrogated personally. Parameters stable. - Continue aspirin and coumadin.  3) Chronic anticoagulation: INR goal 2.0-3.0.   No bleeding issues.   - INR today 3.16 today. Discussed with PharmD personally.    - Continue ASA 325 mg for LVAD + warfarin.  4) HTN:  - Blood pressure well controlled. Continue current regimen. 5) Hypothyroidism:  - Followed by Dr Dwyane Dee.  6) Hyperlipidemia:  - Continue Crestor. Zetia stopped due to cost.  7) CAD --stable. No s/s ischemia   Glori Bickers, MD  3:08 PM

## 2017-07-29 ENCOUNTER — Other Ambulatory Visit (HOSPITAL_COMMUNITY): Payer: Self-pay | Admitting: Internal Medicine

## 2017-07-29 DIAGNOSIS — Z7901 Long term (current) use of anticoagulants: Secondary | ICD-10-CM

## 2017-07-29 DIAGNOSIS — Z95811 Presence of heart assist device: Secondary | ICD-10-CM

## 2017-08-02 ENCOUNTER — Ambulatory Visit (HOSPITAL_COMMUNITY)
Admission: RE | Admit: 2017-08-02 | Discharge: 2017-08-02 | Disposition: A | Discharge status: Home / Self Care | Payer: Medicare Other | Source: Ambulatory Visit | Attending: Cardiology | Admitting: Cardiology

## 2017-08-02 ENCOUNTER — Ambulatory Visit (HOSPITAL_COMMUNITY): Payer: Self-pay | Admitting: Pharmacist

## 2017-08-02 DIAGNOSIS — Z95811 Presence of heart assist device: Secondary | ICD-10-CM

## 2017-08-02 DIAGNOSIS — Z7901 Long term (current) use of anticoagulants: Secondary | ICD-10-CM

## 2017-08-02 LAB — PROTIME-INR
INR: 2.95
Prothrombin Time: 30.5 seconds — ABNORMAL HIGH (ref 11.4–15.2)

## 2017-08-02 MED ORDER — WARFARIN SODIUM 5 MG PO TABS: ORAL_TABLET | ORAL | 6 refills | Status: DC

## 2017-08-06 ENCOUNTER — Other Ambulatory Visit (INDEPENDENT_AMBULATORY_CARE_PROVIDER_SITE_OTHER): Payer: Medicare Other

## 2017-08-06 DIAGNOSIS — E890 Postprocedural hypothyroidism: Secondary | ICD-10-CM

## 2017-08-06 LAB — T4, FREE: Free T4: 1.16 ng/dL (ref 0.60–1.60)

## 2017-08-06 LAB — TSH: TSH: 2.44 u[IU]/mL (ref 0.35–4.50)

## 2017-08-09 ENCOUNTER — Ambulatory Visit: Payer: Medicare Other | Admitting: Endocrinology

## 2017-08-16 ENCOUNTER — Ambulatory Visit (HOSPITAL_COMMUNITY)
Admission: RE | Admit: 2017-08-16 | Discharge: 2017-08-16 | Disposition: A | Discharge status: Home / Self Care | Payer: Medicare Other | Source: Ambulatory Visit | Attending: Internal Medicine | Admitting: Internal Medicine

## 2017-08-16 ENCOUNTER — Ambulatory Visit (HOSPITAL_COMMUNITY): Payer: Self-pay | Admitting: Pharmacist

## 2017-08-16 DIAGNOSIS — Z95811 Presence of heart assist device: Secondary | ICD-10-CM | POA: Diagnosis not present

## 2017-08-16 LAB — PROTIME-INR
INR: 2.39
Prothrombin Time: 25.8 seconds — ABNORMAL HIGH (ref 11.4–15.2)

## 2017-08-27 ENCOUNTER — Telehealth (HOSPITAL_COMMUNITY): Payer: Self-pay | Admitting: Unknown Physician Specialty

## 2017-08-27 NOTE — Telephone Encounter (Signed)
Received page from pt stating that he has a yellow wrench alarm on his power module and the power light is red. Pt states that their flight was delayed to [place] this morning by 5 hours and they did not plug in PM. I am presuming that PM internal battery is dead. Pt was instructed to stay on batteries through the weekend. Wife called [facility] in [place] and left a message with the heart failure team. They may be able to provide the pt with an internal battery at this [place] center. If not the pt will report to our VAD clinic on Monday for a new internal battery. Pt and caregiver verbalized understanding of this conversation.  Tanda Rockers RN, BSN VAD Coordinator 24/7 Pager [phone]

## 2017-08-30 ENCOUNTER — Encounter (HOSPITAL_COMMUNITY): Payer: Medicare Other

## 2017-09-01 ENCOUNTER — Other Ambulatory Visit (HOSPITAL_COMMUNITY): Payer: Self-pay | Admitting: Cardiology

## 2017-09-01 ENCOUNTER — Other Ambulatory Visit (HOSPITAL_COMMUNITY): Payer: Self-pay | Admitting: *Deleted

## 2017-09-01 ENCOUNTER — Ambulatory Visit (HOSPITAL_COMMUNITY): Payer: Self-pay | Admitting: Pharmacist

## 2017-09-01 ENCOUNTER — Ambulatory Visit (HOSPITAL_COMMUNITY)
Admission: RE | Admit: 2017-09-01 | Discharge: 2017-09-01 | Disposition: A | Discharge status: Home / Self Care | Payer: Medicare Other | Source: Ambulatory Visit | Attending: Cardiology | Admitting: Cardiology

## 2017-09-01 DIAGNOSIS — I5043 Acute on chronic combined systolic (congestive) and diastolic (congestive) heart failure: Secondary | ICD-10-CM

## 2017-09-01 DIAGNOSIS — Z95811 Presence of heart assist device: Secondary | ICD-10-CM | POA: Insufficient documentation

## 2017-09-01 DIAGNOSIS — E785 Hyperlipidemia, unspecified: Secondary | ICD-10-CM

## 2017-09-01 DIAGNOSIS — I5020 Unspecified systolic (congestive) heart failure: Secondary | ICD-10-CM

## 2017-09-01 LAB — PROTIME-INR
INR: 2.97
Prothrombin Time: 30.6 seconds — ABNORMAL HIGH (ref 11.4–15.2)

## 2017-09-01 MED ORDER — ROSUVASTATIN CALCIUM 20 MG PO TABS: 20.0000 mg | ORAL_TABLET | Freq: Every day | ORAL | 3 refills | Status: DC

## 2017-09-06 ENCOUNTER — Telehealth: Payer: Self-pay | Admitting: *Deleted

## 2017-09-06 ENCOUNTER — Telehealth (HOSPITAL_COMMUNITY): Payer: Self-pay | Admitting: *Deleted

## 2017-09-06 NOTE — Telephone Encounter (Signed)
Opened in error

## 2017-09-06 NOTE — Telephone Encounter (Signed)
Pt's wife called VAD clinic to report new redness around VAD exit site. Denies tenderness, drainage, fevers, or chills. She is questioning whether it might be another fungal infection. Pt is using Sorbaview dressing and did "get hot" several times last week. She sent picture of site, reviewed with Dr. Haroldine Laws.      Informed wife per Dr. Haroldine Laws to switch to daily dressing kits using gauze dressing. May use silver strips around exit site, but if redness continues to stop using silver and just use gauze dressing. Pt may have developed allergy to BioPatch (CHG). Instructed to call if symptoms worsen or do not improve. Wife verbalized understanding of same.

## 2017-09-07 ENCOUNTER — Telehealth (HOSPITAL_COMMUNITY): Payer: Self-pay | Admitting: Unknown Physician Specialty

## 2017-09-07 MED ORDER — DOXYCYCLINE HYCLATE 100 MG PO CAPS: 100.0000 mg | ORAL_CAPSULE | Freq: Two times a day (BID) | ORAL | 0 refills | Status: DC

## 2017-09-07 NOTE — Telephone Encounter (Signed)
Received call from the pt stating that there is brown drainage coming from his driveline along with increased redness at the site. Pt was instructed to switch to daily dressing kit yesterday. Pt was unable to do that yesterday but is switching today. Caregiver was instructed to put a silver strip on the site as well. Per Dr. Haroldine Laws a script was sent to [facility] for Doxycycline 100 mg BID for 7 days. Pt was instructed that if the drainage increases or the dressing looks worse tomorrow to page the VAD pager for possible IV antibiotics. Pt/caregiver verbalized understanding of all instructions given.   Tanda Rockers RN, BSN VAD Coordinator 24/7 Pager [phone]

## 2017-09-09 DIAGNOSIS — M25561 Pain in right knee: Secondary | ICD-10-CM | POA: Diagnosis not present

## 2017-09-15 ENCOUNTER — Other Ambulatory Visit (HOSPITAL_COMMUNITY): Payer: Self-pay | Admitting: *Deleted

## 2017-09-15 DIAGNOSIS — Z7901 Long term (current) use of anticoagulants: Secondary | ICD-10-CM

## 2017-09-15 DIAGNOSIS — Z95811 Presence of heart assist device: Secondary | ICD-10-CM

## 2017-09-15 DIAGNOSIS — I5043 Acute on chronic combined systolic (congestive) and diastolic (congestive) heart failure: Secondary | ICD-10-CM

## 2017-09-16 ENCOUNTER — Ambulatory Visit (HOSPITAL_COMMUNITY)
Admission: RE | Admit: 2017-09-16 | Discharge: 2017-09-16 | Disposition: A | Discharge status: Home / Self Care | Payer: Medicare Other | Source: Ambulatory Visit | Attending: Cardiology | Admitting: Cardiology

## 2017-09-16 ENCOUNTER — Encounter (HOSPITAL_COMMUNITY): Payer: Self-pay

## 2017-09-16 ENCOUNTER — Ambulatory Visit (HOSPITAL_COMMUNITY): Payer: Self-pay | Admitting: Pharmacist

## 2017-09-16 VITALS — BP 90/0 | HR 74 | Ht 69.0 in | Wt 164.0 lb

## 2017-09-16 DIAGNOSIS — E039 Hypothyroidism, unspecified: Secondary | ICD-10-CM | POA: Diagnosis not present

## 2017-09-16 DIAGNOSIS — I2510 Atherosclerotic heart disease of native coronary artery without angina pectoris: Secondary | ICD-10-CM | POA: Diagnosis not present

## 2017-09-16 DIAGNOSIS — Z79899 Other long term (current) drug therapy: Secondary | ICD-10-CM | POA: Diagnosis not present

## 2017-09-16 DIAGNOSIS — I5043 Acute on chronic combined systolic (congestive) and diastolic (congestive) heart failure: Secondary | ICD-10-CM

## 2017-09-16 DIAGNOSIS — I255 Ischemic cardiomyopathy: Secondary | ICD-10-CM | POA: Diagnosis not present

## 2017-09-16 DIAGNOSIS — Z7901 Long term (current) use of anticoagulants: Secondary | ICD-10-CM

## 2017-09-16 DIAGNOSIS — I10 Essential (primary) hypertension: Secondary | ICD-10-CM

## 2017-09-16 DIAGNOSIS — Z79890 Hormone replacement therapy (postmenopausal): Secondary | ICD-10-CM | POA: Insufficient documentation

## 2017-09-16 DIAGNOSIS — I110 Hypertensive heart disease with heart failure: Secondary | ICD-10-CM | POA: Diagnosis not present

## 2017-09-16 DIAGNOSIS — I5022 Chronic systolic (congestive) heart failure: Secondary | ICD-10-CM | POA: Diagnosis not present

## 2017-09-16 DIAGNOSIS — Z96651 Presence of right artificial knee joint: Secondary | ICD-10-CM | POA: Insufficient documentation

## 2017-09-16 DIAGNOSIS — Z955 Presence of coronary angioplasty implant and graft: Secondary | ICD-10-CM | POA: Insufficient documentation

## 2017-09-16 DIAGNOSIS — F419 Anxiety disorder, unspecified: Secondary | ICD-10-CM | POA: Insufficient documentation

## 2017-09-16 DIAGNOSIS — Z95811 Presence of heart assist device: Secondary | ICD-10-CM

## 2017-09-16 DIAGNOSIS — I252 Old myocardial infarction: Secondary | ICD-10-CM | POA: Insufficient documentation

## 2017-09-16 DIAGNOSIS — E785 Hyperlipidemia, unspecified: Secondary | ICD-10-CM | POA: Diagnosis not present

## 2017-09-16 DIAGNOSIS — Z7982 Long term (current) use of aspirin: Secondary | ICD-10-CM | POA: Diagnosis not present

## 2017-09-16 LAB — COMPREHENSIVE METABOLIC PANEL
ALT: 21 U/L (ref 17–63)
AST: 27 U/L (ref 15–41)
Albumin: 3.9 g/dL (ref 3.5–5.0)
Alkaline Phosphatase: 70 U/L (ref 38–126)
Anion gap: 11 (ref 5–15)
BUN: 25 mg/dL — ABNORMAL HIGH (ref 6–20)
CO2: 19 mmol/L — ABNORMAL LOW (ref 22–32)
Calcium: 9 mg/dL (ref 8.9–10.3)
Chloride: 106 mmol/L (ref 101–111)
Creatinine, Ser: 1.43 mg/dL — ABNORMAL HIGH (ref 0.61–1.24)
GFR calc Af Amer: 54 mL/min — ABNORMAL LOW (ref >60)
GFR calc non Af Amer: 47 mL/min — ABNORMAL LOW (ref >60)
Glucose, Bld: 88 mg/dL (ref 65–99)
Potassium: 4.3 mmol/L (ref 3.5–5.1)
Sodium: 136 mmol/L (ref 135–145)
Total Bilirubin: 1.1 mg/dL (ref 0.3–1.2)
Total Protein: 6.6 g/dL (ref 6.5–8.1)

## 2017-09-16 LAB — CBC
HCT: 44 % (ref 39.0–52.0)
Hemoglobin: 14.7 g/dL (ref 13.0–17.0)
MCH: 30.2 pg (ref 26.0–34.0)
MCHC: 33.4 g/dL (ref 30.0–36.0)
MCV: 90.3 fL (ref 78.0–100.0)
Platelets: 150 10*3/uL (ref 150–400)
RBC: 4.87 MIL/uL (ref 4.22–5.81)
RDW: 15.4 % (ref 11.5–15.5)
WBC: 5.2 10*3/uL (ref 4.0–10.5)

## 2017-09-16 LAB — LACTATE DEHYDROGENASE: LDH: 218 U/L — ABNORMAL HIGH (ref 98–192)

## 2017-09-16 LAB — PROTIME-INR
INR: 3.16
Prothrombin Time: 32.2 seconds — ABNORMAL HIGH (ref 11.4–15.2)

## 2017-09-16 LAB — PREALBUMIN: Prealbumin: 20.8 mg/dL (ref 18–38)

## 2017-09-16 NOTE — Progress Notes (Signed)
Patient presents for 2 month follow up with wife in [facility] today. Reports no problems with VAD equipment or concerns with drive line. He will complete hie 4 year Intermacs f/u and VAD equipment annual maintenance today.   Bruising noted on back of both hands; pt states he crawled under house to help install heating ducts and scraped hands.. He also was helping cut bamboo and suffered a skin tear on back of hand.   Wife reports he finished Doxy (7 day course for drive line redness) "few days ago" and site looks better. She is still using gauze dressing and is changing every 2 - 3 days. Does not require changing today. She reviewed pictures of site and redness is resolving.   Vital Signs:  Doppler Pressure: 90  Automatc BP:  106/75 (80) HR:  74 SPO2: 97%  Weight: 164 lb w/o eqt Last weight: 164 lb Home weights: 160-165 lbs   VAD Indication: Destination therapy- patient choice    VAD interrogation & Equipment Management: Speed:9200 Flow: 4.2 Power:5.1 w    PI:  5.7  Alarms: few Low Voltage Events: 0 - 5 PI events daily  Fixed speed 9200 Low speed limit: 8600  Primary Controller:  Replace back up battery in 8 months. Back up controller:   Replace back up battery in 8 months.  Annual Equipment Maintenance on UBC/PM was performed on today.   I reviewed the LVAD parameters from today and compared the results to the patient's prior recorded data. LVAD interrogation was NEGATIVE for significant power changes, NEGATIVE for clinical alarms and STABLE for PI events/speed drops. No programming changes were made and pump is functioning within specified parameters. Pt is performing daily controller and system monitor self tests along with completing weekly and monthly maintenance for LVAD equipment.  LVAD equipment check completed and is in good working order. Back-up equipment present. Charged back up battery today.  Exit Site Care: Drive line is being maintained every 2 - 3  days by [name], his wife. Gauze dressing dry and intact. Stabilization device present and accurately applied. Pt denies fever or chills. Pt provided with 5 weekly and 5 daily dressing kits for home use. Pt has CHG allergy.  Significant Events on VAD Support:  10/2013>SBO 12/2016> elevated LDH, admit for Bival  Device:Medtronic ERI Therapies: off Last check:  09/23/17   BP & Labs:  Doppler BP 90  - Doppler is reflecting modified systolic  Hgb 20.6 - No S/S of bleeding. Specifically denies melena/BRBPR or nosebleeds.  LDH stable at 218 with established baseline of 200- 250. Denies tea-colored urine. No power elevations noted on interrogation.    Batteries Manufacture Date: Number of uses: Re-calibration  12/06/15 56 - 60 Performed by patient   Annual maintenance completed per [employer] on patient's home power module and universal Char[employer] fundraiser.    Backup system controller 11 volt battery charged during visit.   4 year Intermacs follow up completed including:  Quality of Life, KCCQ-12, and Neurocognitive trail making.   Pt completed 1700 feet during 6 minute walk.  Patient Instructions:  1. No change in medications 2. Return to clinic in 2 months  [place] Coordinator   Office: [phone] 24/7 Emergency [place] Pager: [phone]

## 2017-09-16 NOTE — Progress Notes (Signed)
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   Admitted 3/4-3/25/15 with SOB and syncopal episode. Found to have SBO and NG placed and started on TPN. On 11/06/13 developed progressive SOB and hypotension and co-ox 51% and Hgb 6.7. Started on milrinone and transfused. Echo showed nl RV function. Patient had PEA arrest and was intubated and started on pressors. CXR revealed a R hemothorax and a R CT placed. He required VATs and evacuation of hemothorax and was then tansferred to CIR.   LVAD Issues  Elevated LDH--bivalirudin. D/C LDH 188  Today he returns for routine LVAD follow up. He is now 4 years out from VAD placement. Doing very well. Remains active without SOB. 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 Recently had superficial driveline site infection. Resolved with doxy. Denies orthopnea or PND. No fevers, chills or problems with driveline. No bleeding, melena or neuro symptoms. No VAD alarms. Taking all meds as prescribed.   Echo 3/18 EF 10-15%. RV moderate HK. LVAD cannula ok. Mild AI  Echo 9/16 EF 20%, LVAD cannula ok moderate RV dilation with mild to moderately decreased RV systolic function.   VAD Indication: Destination therapy- patient choice    VAD interrogation & Equipment Management: Speed:9200 Flow: 4.2 Power:5.1 w PI:  5.7  Alarms: few Low Voltage Events: 0 - 5 PI events daily  Fixed speed 9200 Low speed limit: 8600  Primary Controller: Replace back up battery in 8 months.   Annual Equipment Maintenance on UBC/PM was performed on 08/2016.     Past Medical History:  Diagnosis Date  . Anxiety   . Automatic implantable cardioverter-defibrillator in situ   . CAD (coronary artery disease)    S/P stenting of LCX in 2004;  s/p Lat MI 2009 with occlusion of the LCX - treated with Promus  stenting. Has total occlusion of the RCA.   [name] CHF (congestive heart failure) ([place])   . Hypercholesteremia   . Hypertension   . Hypothyroidism   . ICD (implantable cardiac defibrillator) in place    PROPHYLACTIC      medtronic  . Inferior MI ([place]) "? date"; 2009  . Ischemic cardiomyopathy    a. 10/09 Echo: Sev LV Dysfxn, inf/lat AK, Mod MR.  [name] LVAD (left ventricular assist device) present ([place])   . Osteoarthritis    a. s/p R TKR  . Pacemaker   . PVC (premature ventricular contraction)   . RBBB   . Shortness of breath     Current Outpatient Medications  Medication Sig Dispense Refill  . acetaminophen (TYLENOL) 500 MG chewable tablet Chew 500 mg by mouth every 6 (six) hours as needed for pain.    [name] aspirin EC 325 MG tablet Take 325 mg by mouth daily.    . citalopram (CELEXA) 10 MG tablet Take 1 tablet (10 mg total) by mouth daily. 90 tablet 3  . Coenzyme Q10 (COQ10) 200 MG CAPS Take 200 mg by mouth daily.    . hydrALAZINE (APRESOLINE) 50 MG tablet Take 1 tablet (50 mg total) by mouth 3 (three) times daily. 270 tablet 3  . levothyroxine (SYNTHROID, LEVOTHROID) 100 MCG tablet Take 100 mcg by mouth daily before breakfast. Take one tablet daily except 1.5 tablet on Sun/Wed    . Melatonin 3 MG TABS Take 1 tablet by mouth at bedtime.    [name]  pantoprazole (PROTONIX) 40 MG tablet TAKE ONE TABLET BY MOUTH ONCE DAILY 90 tablet 3  . potassium chloride SA (K-DUR,KLOR-CON) 20 MEQ tablet Take 1 tablet (20 mEq total) by mouth daily as needed. Take one potassium pill when you take your lasix. 90 tablet 3  . Probiotic Product (PROBIOTIC PO) Take 1 capsule by mouth daily.    . ranitidine (ZANTAC) 150 MG tablet Take 150 mg by mouth daily as needed for heartburn.    . rosuvastatin (CRESTOR) 20 MG tablet Take 1 tablet (20 mg total) by mouth daily. 90 tablet 3  . spironolactone (ALDACTONE) 25 MG tablet Take 1 tablet (25 mg total) by mouth daily. 30 tablet 3  . warfarin (COUMADIN) 5 MG tablet Take 10 mg (2  tablets) daily except 7.5 mg (1 and 1/2 tablets) on Mon and Wed OR  AS  DIRECTED  BY  VAD  CLINIC 60 tablet 6  . furosemide (LASIX) 20 MG tablet Take 20 mg by mouth daily as needed (Take 20 mg as daily as needed for wt > 153 lbs). Reported on 01/14/2016    . temazepam (RESTORIL) 15 MG capsule TAKE ONE CAPSULE BY MOUTH ONCE DAILY AT BEDTIME (Patient not taking: Reported on 09/16/2017) 30 capsule 0   No current facility-administered medications for this encounter.    Facility-Administered Medications Ordered in Other Encounters  Medication Dose Route Frequency Provider Last Rate Last Dose  . etomidate (AMIDATE) injection    Anesthesia Intra-op Myna Bright, CRNA   20 mg at 02/08/14 0915  . rocuronium [facility]) injection    Anesthesia Intra-op Myna Bright, CRNA   50 mg at 02/08/14 0932  . succinylcholine (ANECTINE) injection    Anesthesia Intra-op Myna Bright, CRNA   100 mg at 02/08/14 0915    Ace inhibitors; Diovan [valsartan]; Lipitor [atorvastatin calcium]; and Norvasc [amlodipine]  REVIEW OF SYSTEMS: All systems negative except as listed in HPI, PMH and Problem list.   Vitals:   09/16/17 1027 09/16/17 1028  BP: 106/75 (!) 90/0  Pulse: 74   SpO2: 97%   Weight: 164 lb (74.4 kg)   Height: 5\' 9"  (1.753 m)      Vital Signs:  Doppler Pressure: 90   Automatc BP:  106/75 (80) HR:  74 SPO2: 97%  Weight: 164 lb w/o eqt Last weight: 164 lb Home weights: 160-165 lbs    Vital signs: BP (!) 90/0 Comment: Doppler modified systolic  Pulse 74   Ht 5\' 9"  (1.753 m)   Wt 164 lb (74.4 kg)   SpO2 97%   BMI 24.22 kg/m    Physical Exam: General:  NAD.  HEENT: normal  Neck: supple. JVP not elevated.  Carotids 2+ bilat; no bruits. No lymphadenopathy or thryomegaly appreciated. Cor: LVAD hum.  Lungs: Clear. Abdomen:soft, nontender, non-distended. No hepatosplenomegaly. No bruits or masses. Good bowel sounds. Driveline site clean. Anchor in place.  Extremities: no  cyanosis, clubbing, rash. Warm no edema  Neuro: alert & oriented x 3. No focal deficits. Moves all 4 without problem    ASSESSMENT AND PLAN:  1) Chronic systolic HF: ICM, EF 27-78% s/p LVAD HM II implant 08/2013 for DT.   - Echo 3/18 EF 10-15% mild AI. Mod RV dysfunction. LVAD cannula ok. - Doing very well now 4 years out from VAD placement. NYHA I. Volume status ok.  -  He has not tolerated ACE-I or amlodipine in the past. - ICD at [facility]. Has seen Dr. Rayann Heman in 2/18 and decided not  to replace.   2) LVAD: HMII 08/2013 for DT.  LVAD parameters stable. - Admitted in May 2018 with elevated LDH with bival.  - LDH 218 today. No power spikes. VAD interrogated personally. Parameters stable. - Continue aspirin and coumadin.  - Recent superficial driveline infection now cleared with doxy 3) Chronic anticoagulation: INR goal 2.0-3.0.   No bleeding issues.   - INR today 3.16  today. Discussed dosing with PharmD personally  - Continue ASA 325 mg for LVAD + warfarin.  4) HTN:  - Blood pressure well controlled. Continue current regimen. 5) Hypothyroidism:  - Followed by Dr Dwyane Dee.  6) Hyperlipidemia:  - Continue Crestor. Zetia stopped due to cost.  7) CAD --stable no s/s ischemia   Total time spent 35 minutes. Over half that time spent discussing above.   Glori Bickers, MD  10:28 PM

## 2017-09-24 ENCOUNTER — Other Ambulatory Visit (HOSPITAL_COMMUNITY): Payer: Self-pay | Admitting: Unknown Physician Specialty

## 2017-09-24 DIAGNOSIS — Z95811 Presence of heart assist device: Secondary | ICD-10-CM

## 2017-09-24 DIAGNOSIS — Z7901 Long term (current) use of anticoagulants: Secondary | ICD-10-CM

## 2017-09-30 ENCOUNTER — Ambulatory Visit (HOSPITAL_COMMUNITY)
Admission: RE | Admit: 2017-09-30 | Discharge: 2017-09-30 | Disposition: A | Discharge status: Home / Self Care | Payer: Medicare Other | Source: Ambulatory Visit | Attending: Cardiology | Admitting: Cardiology

## 2017-09-30 ENCOUNTER — Ambulatory Visit (HOSPITAL_COMMUNITY): Payer: Self-pay | Admitting: Pharmacist

## 2017-09-30 DIAGNOSIS — Z7901 Long term (current) use of anticoagulants: Secondary | ICD-10-CM | POA: Insufficient documentation

## 2017-09-30 DIAGNOSIS — Z95811 Presence of heart assist device: Secondary | ICD-10-CM | POA: Insufficient documentation

## 2017-09-30 DIAGNOSIS — Z5181 Encounter for therapeutic drug level monitoring: Secondary | ICD-10-CM | POA: Insufficient documentation

## 2017-09-30 LAB — PROTIME-INR
INR: 2.45
Prothrombin Time: 26.4 seconds — ABNORMAL HIGH (ref 11.4–15.2)

## 2017-10-12 ENCOUNTER — Other Ambulatory Visit (HOSPITAL_COMMUNITY): Payer: Self-pay | Admitting: Internal Medicine

## 2017-10-12 DIAGNOSIS — I10 Essential (primary) hypertension: Secondary | ICD-10-CM

## 2017-10-13 ENCOUNTER — Ambulatory Visit (HOSPITAL_COMMUNITY)
Admission: RE | Admit: 2017-10-13 | Discharge: 2017-10-13 | Disposition: A | Discharge status: Home / Self Care | Payer: Medicare Other | Source: Ambulatory Visit | Attending: Cardiology | Admitting: Cardiology

## 2017-10-13 ENCOUNTER — Ambulatory Visit (HOSPITAL_COMMUNITY): Payer: Self-pay | Admitting: Pharmacist

## 2017-10-13 DIAGNOSIS — Z95811 Presence of heart assist device: Secondary | ICD-10-CM | POA: Insufficient documentation

## 2017-10-13 LAB — PROTIME-INR
INR: 2.29
Prothrombin Time: 25 seconds — ABNORMAL HIGH (ref 11.4–15.2)

## 2017-10-27 ENCOUNTER — Ambulatory Visit (HOSPITAL_COMMUNITY): Payer: Self-pay | Admitting: Pharmacist

## 2017-10-27 ENCOUNTER — Ambulatory Visit (HOSPITAL_COMMUNITY)
Admission: RE | Admit: 2017-10-27 | Discharge: 2017-10-27 | Disposition: A | Discharge status: Home / Self Care | Payer: Medicare Other | Source: Ambulatory Visit | Attending: Cardiology | Admitting: Cardiology

## 2017-10-27 DIAGNOSIS — Z95811 Presence of heart assist device: Secondary | ICD-10-CM

## 2017-10-27 DIAGNOSIS — Z09 Encounter for follow-up examination after completed treatment for conditions other than malignant neoplasm: Secondary | ICD-10-CM | POA: Diagnosis not present

## 2017-10-27 DIAGNOSIS — Z9889 Other specified postprocedural states: Secondary | ICD-10-CM | POA: Insufficient documentation

## 2017-10-27 LAB — PROTIME-INR
INR: 2.16
Prothrombin Time: 23.9 seconds — ABNORMAL HIGH (ref 11.4–15.2)

## 2017-11-12 ENCOUNTER — Other Ambulatory Visit (HOSPITAL_COMMUNITY): Payer: Self-pay | Admitting: Unknown Physician Specialty

## 2017-11-12 DIAGNOSIS — Z7901 Long term (current) use of anticoagulants: Secondary | ICD-10-CM

## 2017-11-12 DIAGNOSIS — Z95811 Presence of heart assist device: Secondary | ICD-10-CM

## 2017-11-15 ENCOUNTER — Ambulatory Visit (HOSPITAL_COMMUNITY): Payer: Self-pay | Admitting: Pharmacist

## 2017-11-15 ENCOUNTER — Ambulatory Visit (HOSPITAL_COMMUNITY)
Admission: RE | Admit: 2017-11-15 | Discharge: 2017-11-15 | Disposition: A | Discharge status: Home / Self Care | Payer: Medicare Other | Source: Ambulatory Visit | Attending: Cardiology | Admitting: Cardiology

## 2017-11-15 VITALS — BP 90/0 | HR 72 | Wt 164.2 lb

## 2017-11-15 DIAGNOSIS — I5022 Chronic systolic (congestive) heart failure: Secondary | ICD-10-CM | POA: Insufficient documentation

## 2017-11-15 DIAGNOSIS — Z7901 Long term (current) use of anticoagulants: Secondary | ICD-10-CM | POA: Diagnosis not present

## 2017-11-15 DIAGNOSIS — I10 Essential (primary) hypertension: Secondary | ICD-10-CM | POA: Diagnosis not present

## 2017-11-15 DIAGNOSIS — Z79899 Other long term (current) drug therapy: Secondary | ICD-10-CM | POA: Insufficient documentation

## 2017-11-15 DIAGNOSIS — I2510 Atherosclerotic heart disease of native coronary artery without angina pectoris: Secondary | ICD-10-CM | POA: Insufficient documentation

## 2017-11-15 DIAGNOSIS — Z95811 Presence of heart assist device: Secondary | ICD-10-CM | POA: Insufficient documentation

## 2017-11-15 DIAGNOSIS — Z7982 Long term (current) use of aspirin: Secondary | ICD-10-CM | POA: Insufficient documentation

## 2017-11-15 DIAGNOSIS — I110 Hypertensive heart disease with heart failure: Secondary | ICD-10-CM | POA: Insufficient documentation

## 2017-11-15 DIAGNOSIS — E039 Hypothyroidism, unspecified: Secondary | ICD-10-CM | POA: Diagnosis not present

## 2017-11-15 DIAGNOSIS — F419 Anxiety disorder, unspecified: Secondary | ICD-10-CM | POA: Insufficient documentation

## 2017-11-15 LAB — BASIC METABOLIC PANEL
Anion gap: 9 (ref 5–15)
BUN: 22 mg/dL — ABNORMAL HIGH (ref 6–20)
CO2: 25 mmol/L (ref 22–32)
Calcium: 9.1 mg/dL (ref 8.9–10.3)
Chloride: 102 mmol/L (ref 101–111)
Creatinine, Ser: 1.58 mg/dL — ABNORMAL HIGH (ref 0.61–1.24)
GFR calc Af Amer: 48 mL/min — ABNORMAL LOW (ref >60)
GFR calc non Af Amer: 41 mL/min — ABNORMAL LOW (ref >60)
Glucose, Bld: 82 mg/dL (ref 65–99)
Potassium: 4.4 mmol/L (ref 3.5–5.1)
Sodium: 136 mmol/L (ref 135–145)

## 2017-11-15 LAB — PROTIME-INR
INR: 2.96
Prothrombin Time: 30.6 seconds — ABNORMAL HIGH (ref 11.4–15.2)

## 2017-11-15 LAB — CBC
HCT: 46 % (ref 39.0–52.0)
Hemoglobin: 15.1 g/dL (ref 13.0–17.0)
MCH: 30.1 pg (ref 26.0–34.0)
MCHC: 32.8 g/dL (ref 30.0–36.0)
MCV: 91.6 fL (ref 78.0–100.0)
Platelets: 152 10*3/uL (ref 150–400)
RBC: 5.02 MIL/uL (ref 4.22–5.81)
RDW: 15.7 % — ABNORMAL HIGH (ref 11.5–15.5)
WBC: 5.8 10*3/uL (ref 4.0–10.5)

## 2017-11-15 LAB — LACTATE DEHYDROGENASE: LDH: 237 U/L — ABNORMAL HIGH (ref 98–192)

## 2017-11-15 MED ORDER — SPIRONOLACTONE 25 MG PO TABS: 25.0000 mg | ORAL_TABLET | Freq: Every day | ORAL | 3 refills | Status: DC

## 2017-11-15 NOTE — Progress Notes (Signed)
VAD CLINIC NOTE   HPI:  Johnny Oneill is a 77 y.o. 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   Admitted 3/4-3/25/15 with SOB and syncopal episode. Found to have SBO and NG placed and started on TPN. On 11/06/13 developed progressive SOB and hypotension and co-ox 51% and Hgb 6.7. Started on milrinone and transfused. Echo showed nl RV function. Patient had PEA arrest and was intubated and started on pressors. CXR revealed a R hemothorax and a R CT placed. He required VATs and evacuation of hemothorax and was then tansferred to CIR.   LVAD Issues  Elevated LDH--bivalirudin. D/C LDH 188  He presents today for regular follow up. Overall, he is without complaint. He remains active without SOB. 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 No fevers, chills, or further problems with his driveline. No bleeding, melena, or neuro symptoms. No VAD alarms.  He does get mildly lightheaded on Monday and Thursday mornings when he is helping clean the church. Usually only when he bends over and gets back up quickly. He is taking all medications as prescribed. Has not needed any lasix.   Echo 3/18 EF 10-15%. RV moderate HK. LVAD cannula ok. Mild AI  Echo 9/16 EF 20%, LVAD cannula ok moderate RV dilation with mild to moderately decreased RV systolic function.   VAD Indication: Destination therapy- Patient Choice.     VAD interrogation & Equipment Management: Speed: 9200 Flow: 4.4 Power: 5.1 w PI: 6.8  Alarms: No alarms Events: 0-10 PI events daily.   Fixed speed 9200 Low speed limit: 8600  Primary Controller: Replace back up battery in 6 months.  Annual Equipment Maintenance on UBC/PM was performed on 08/2017  Past Medical History:  Diagnosis Date  . Anxiety   . Automatic implantable cardioverter-defibrillator in situ   . CAD (coronary artery disease)    S/P stenting of LCX in 2004;  s/p Lat MI 2009  with occlusion of the LCX - treated with Promus stenting. Has total occlusion of the RCA.   [name] CHF (congestive heart failure) ([place])   . Hypercholesteremia   . Hypertension   . Hypothyroidism   . ICD (implantable cardiac defibrillator) in place    PROPHYLACTIC      medtronic  . Inferior MI ([place]) "? date"; 2009  . Ischemic cardiomyopathy    a. 10/09 Echo: Sev LV Dysfxn, inf/lat AK, Mod MR.  [name] LVAD (left ventricular assist device) present ([place])   . Osteoarthritis    a. s/p R TKR  . Pacemaker   . PVC (premature ventricular contraction)   . RBBB   . Shortness of breath     Current Outpatient Medications  Medication Sig Dispense Refill  . acetaminophen (TYLENOL) 500 MG chewable tablet Chew 500 mg by mouth every 6 (six) hours as needed for pain.    [name] aspirin EC 325 MG tablet Take 325 mg by mouth daily.    . citalopram (CELEXA) 10 MG tablet Take 1 tablet (10 mg total) by mouth daily. 90 tablet 3  . Coenzyme Q10 (COQ10) 200 MG CAPS Take 200 mg by mouth daily.    . furosemide (LASIX) 20 MG tablet Take 20 mg by mouth daily as needed (Take 20 mg as daily as needed for wt > 153 lbs). Reported on 01/14/2016    . hydrALAZINE (APRESOLINE) 50 MG tablet TAKE ONE TABLET BY MOUTH THREE TIMES DAILY 270 tablet  3  . levothyroxine (SYNTHROID, LEVOTHROID) 100 MCG tablet Take 100 mcg by mouth daily before breakfast. Take one tablet daily except 1.5 tablet on Sun/Wed    . Melatonin 3 MG TABS Take 1 tablet by mouth at bedtime.    . pantoprazole (PROTONIX) 40 MG tablet TAKE ONE TABLET BY MOUTH ONCE DAILY 90 tablet 3  . potassium chloride SA (K-DUR,KLOR-CON) 20 MEQ tablet Take 1 tablet (20 mEq total) by mouth daily as needed. Take one potassium pill when you take your lasix. 90 tablet 3  . Probiotic Product (PROBIOTIC PO) Take 1 capsule by mouth daily.    . ranitidine (ZANTAC) 150 MG tablet Take 150 mg by mouth daily as needed for heartburn.    . rosuvastatin (CRESTOR) 20 MG tablet Take 1 tablet (20 mg total) by  mouth daily. 90 tablet 3  . spironolactone (ALDACTONE) 25 MG tablet Take 1 tablet (25 mg total) by mouth daily. 30 tablet 3  . temazepam (RESTORIL) 15 MG capsule TAKE ONE CAPSULE BY MOUTH ONCE DAILY AT BEDTIME (Patient not taking: Reported on 09/16/2017) 30 capsule 0  . warfarin (COUMADIN) 5 MG tablet Take 10 mg by mouth daily.     No current facility-administered medications for this encounter.    Facility-Administered Medications Ordered in Other Encounters  Medication Dose Route Frequency Provider Last Rate Last Dose  . etomidate (AMIDATE) injection    Anesthesia Intra-op Myna Bright, CRNA   20 mg at 02/08/14 0915  . rocuronium [facility]) injection    Anesthesia Intra-op Myna Bright, CRNA   50 mg at 02/08/14 0932  . succinylcholine (ANECTINE) injection    Anesthesia Intra-op Myna Bright, CRNA   100 mg at 02/08/14 0915   Ace inhibitors; Diovan [valsartan]; Lipitor [atorvastatin calcium]; and Norvasc [amlodipine]  Review of systems complete and found to be negative unless listed in HPI.    Vital Signs:  Doppler Pressure: 90 Automatc BP: 105/68 (80) HR: 72 SPO2: 98%  Weight: 164.2 lb w/o eqt Last weight: 164164 lb Home weights: 160-165 lbs  Vital signs: Vitals:   11/15/17 1345 11/15/17 1346  BP: 105/68 (!) 90/0  Pulse: 72   SpO2: 98%   Weight: 164 lb 3.2 oz (74.5 kg)    Physical Exam: GENERAL: Well appearing this am. NAD.  HEENT: Normal. NECK: Supple, JVP 7-8 cm. Carotids OK.  CARDIAC:  Mechanical heart sounds with LVAD hum present.  LUNGS:  CTAB, normal effort.  ABDOMEN:  NT, ND, no HSM. No bruits or masses. +BS  LVAD exit site: Well-healed and incorporated. Dressing dry and intact. No erythema or drainage. Stabilization device present and accurately applied. Driveline dressing changed daily per sterile technique. EXTREMITIES:  Warm and dry. No cyanosis, clubbing, rash, or edema.  NEUROLOGIC:  Alert & oriented x 3. Cranial nerves grossly intact. Moves  all 4 extremities w/o difficulty. Affect pleasant     ASSESSMENT AND PLAN:  1) Chronic systolic HF: ICM, EF 71-69% s/p LVAD HM II implant 08/2013 for DT.   - Echo 3/18 EF 10-15% mild AI. Mod RV dysfunction. LVAD cannula OK - NYHA I symptoms. Doing very well. Over 4 years out from VAD.  - Volume status stable on exam.  - Move spiro to evening with mild lightheadedness when cleaning at church on Mon/Thurs - He has not tolerated ACE-I or amlodipine in the past. - ICD at [facility]. Has seen Dr. Rayann Heman in 2/18 and decided not to replace.   2) LVAD: HMII 08/2013 for DT. -  VAD interrogated personally. Parameters stable.  - Admitted in May 2018 with elevated LDH with bival.  - LDH 237. No power spikes.  - Continue aspirin and coumadin.  - Superficial driveline infection now cleared with doxy 3) Chronic anticoagulation: INR goal 2.0-3.0.   No bleeding issues.   - INR 2.96 today. Discussed dosing with Pharm-D personally.  - Continue ASA 325 mg for LVAD + warfarin.  4) HTN:  - Stable on current regimen.  - Continue hydral 50 mg TID. 5) Hypothyroidism:  - Followed by Dr Dwyane Dee.  6) Hyperlipidemia:  - Continue Crestor. Zetia stopped due to cost.  7) CAD -No s/s of ischemia.     Doing very well. Move spiro to bedtime to see if helps morning dizziness when cleaning church. If needed, can consider cutting back his hydral. Labs stable today.   Shirley Friar, PA-C  11:26 AM  Greater than 50% of the 30 minute visit was spent in counseling/coordination of care regarding disease state education, salt/fluid restriction, sliding scale diuretics, and medication compliance.

## 2017-11-15 NOTE — Progress Notes (Signed)
Patient presents for 2 month follow up in [facility] today. Reports no problems with VAD equipment or concerns with drive line.   Vital Signs:  Doppler Pressure: 90  Automatc BP:  105/60 (80) HR:  72 SPO2: 98%  Weight: 164.2 lb w/o eqt Last weight: 164 lb  VAD Indication: Destination therapy- patient choice    VAD interrogation & Equipment Management: Speed: 9200 Flow: 4.4 Power:5.1 w    PI:  6.8  Alarms: few Low Voltage Events: 0 - 10 PI events daily  Fixed speed 9200 Low speed limit: 8600  Primary Controller:  Replace back up battery in 6 months (prompted to change battery) Back up controller:   Replace back up battery in 6 months (prompted to change battery)  Primary controller:  [id number]    New back up battery: [id number]; exp 08/16/19  Back up controller:  [id number]    New back up battery: [id number]; exp 06/13/19  Annual Equipment Maintenance on UBC/PM was performed on 08/2016  I reviewed the LVAD parameters from today and compared the results to the patient's prior recorded data. LVAD interrogation was NEGATIVE for significant power changes, NEGATIVE for clinical alarms and STABLE for PI events/speed drops. No programming changes were made and pump is functioning within specified parameters. Pt is performing daily controller and system monitor self tests along with completing weekly and monthly maintenance for LVAD equipment.  LVAD equipment check completed and is in good working order. Back-up equipment present. Charged back up battery today.  Exit Site Care: Drive line is being maintained weekly by [name], his wife. Sorbaview dressing dry and intact. Stabilization device present and accurately applied. Pt denies fever or chills. Pt has CHG allergy. Pt provided with 4 weekly dressing kits and 5 extra anchors.  Significant Events on VAD Support:  10/2013>SBO 12/2016> elevated LDH, admit for Bival  Device:Medtronic ERI Therapies: off Last check:   09/23/17  BP & Labs:  Doppler BP 90  - Doppler is reflecting modified systolic  Hgb 50.5 - No S/S of bleeding. Specifically denies melena/BRBPR or nosebleeds.  LDH stable at 237 with established baseline of 200- 250. Denies tea-colored urine. No power elevations noted on interrogation.   Patient Instructions:  1. Take Aldactone in the evening.  2.  Return to clinic in 2 months  [place] Coordinator   Office: [phone] 24/7 Emergency [facility] Pager: [phone]

## 2017-11-15 NOTE — Addendum Note (Signed)
Encounter addended by: Lezlie Octave, RN on: 11/15/2017 2:35 PM  Actions taken: Sign clinical note

## 2017-11-25 ENCOUNTER — Other Ambulatory Visit: Payer: Self-pay | Admitting: Endocrinology

## 2017-11-25 DIAGNOSIS — E890 Postprocedural hypothyroidism: Secondary | ICD-10-CM

## 2017-11-29 ENCOUNTER — Ambulatory Visit (HOSPITAL_COMMUNITY)
Admission: RE | Admit: 2017-11-29 | Discharge: 2017-11-29 | Disposition: A | Discharge status: Home / Self Care | Payer: Medicare Other | Source: Ambulatory Visit | Attending: Internal Medicine | Admitting: Internal Medicine

## 2017-11-29 ENCOUNTER — Other Ambulatory Visit (INDEPENDENT_AMBULATORY_CARE_PROVIDER_SITE_OTHER): Payer: Medicare Other

## 2017-11-29 ENCOUNTER — Ambulatory Visit (HOSPITAL_COMMUNITY): Payer: Self-pay | Admitting: Pharmacist

## 2017-11-29 DIAGNOSIS — E890 Postprocedural hypothyroidism: Secondary | ICD-10-CM | POA: Diagnosis not present

## 2017-11-29 DIAGNOSIS — Z95811 Presence of heart assist device: Secondary | ICD-10-CM | POA: Insufficient documentation

## 2017-11-29 LAB — PROTIME-INR
INR: 2.47
Prothrombin Time: 26.5 seconds — ABNORMAL HIGH (ref 11.4–15.2)

## 2017-11-29 LAB — TSH: TSH: 2.69 u[IU]/mL (ref 0.35–4.50)

## 2017-11-29 LAB — T4, FREE: Free T4: 0.96 ng/dL (ref 0.60–1.60)

## 2017-12-01 ENCOUNTER — Ambulatory Visit (INDEPENDENT_AMBULATORY_CARE_PROVIDER_SITE_OTHER): Payer: Medicare Other | Admitting: Endocrinology

## 2017-12-01 ENCOUNTER — Encounter: Payer: Self-pay | Admitting: Endocrinology

## 2017-12-01 VITALS — BP 108/76 | HR 68 | Ht 69.0 in | Wt 169.0 lb

## 2017-12-01 DIAGNOSIS — E890 Postprocedural hypothyroidism: Secondary | ICD-10-CM | POA: Diagnosis not present

## 2017-12-01 DIAGNOSIS — I2510 Atherosclerotic heart disease of native coronary artery without angina pectoris: Secondary | ICD-10-CM | POA: Diagnosis not present

## 2017-12-01 NOTE — Patient Instructions (Signed)
Same dose 

## 2017-12-01 NOTE — Progress Notes (Signed)
Patient ID: Johnny Oneill, male   DOB: 06-03-1941, 77 y.o.   MRN: [id number]   Reason for Appointment:  Hypothyroidism, followup visit    History of Present Illness:   The hypothyroidism was first diagnosed after radioactive iodine treatment for [name] several years ago  Previously he had lost significant amount of weight with his worsening heart failure and subsequently this stabilized More recently his weight has improved somewhat and is stable.  Previously was taking 200 mcg daily since 3/15 when he was hospitalized  Subsequently he was on steady dose of 175 mcg since 11/2013 Subsequently the dose was reduced down to 150 g in 2016   Current regimen: he has been taking 125 g, 8 tablets weekly He takes an extra half tablet on Wednesday and Saturday Although he was told to let us know when he finishes the prescription and switch to 137 mcg he did not do so Also not clear why his medication record indicates he is taking 100 mcg, patient is sure he is taking 125 mcg tablet   He does feel fairly well overall and does not feel unusually tired, able to still walk and be active No cold intolerance or increase in weight that is unexpected With no unusual fatigue, significant weight change or cold intolerance  He gets his prescription from [facility] consistently, he does not know what brand of generic he gets  He is very regular with taking the tablet in the morning before breakfast.  Does not take any vitamins with this   His TSH is again normal and better than on his visit in 9/18   Wt Readings from Last 3 Encounters:  12/01/17 169 lb (76.7 kg)  11/15/17 164 lb 3.2 oz (74.5 kg)  09/16/17 164 lb (74.4 kg)    Lab Results  Component Value Date   TSH 2.69 11/29/2017   TSH 2.44 08/06/2017   TSH 2.986 05/18/2017   FREET4 0.96 11/29/2017   FREET4 1.16 08/06/2017   FREET4 1.25 05/07/2017      Allergies as of 12/01/2017      Reactions   Ace Inhibitors    Hypotension and elevated creatinine   Diovan [valsartan] Other (See Comments)   Hypotension at low dose, hyperkalemia   Lipitor [atorvastatin Calcium] Other (See Comments)   Muscle pain   Norvasc [amlodipine] Other (See Comments)   Put patient to sleep      Medication List        Accurate as of 12/01/17  9:38 AM. Always use your most recent med list.          acetaminophen 500 MG chewable tablet Commonly known as:  TYLENOL Chew 500 mg by mouth every 6 (six) hours as needed for pain.   aspirin EC 325 MG tablet Take 325 mg by mouth daily.   citalopram 10 MG tablet Commonly known as:  CELEXA Take 1 tablet (10 mg total) by mouth daily.   CoQ10 200 MG Caps Take 200 mg by mouth daily.   furosemide 20 MG tablet Commonly known as:  LASIX Take 20 mg by mouth daily as needed (Take 20 mg as daily as needed for wt > 153 lbs). Reported on 01/14/2016   hydrALAZINE 50 MG tablet Commonly known as:  APRESOLINE TAKE ONE TABLET BY MOUTH THREE TIMES DAILY   Melatonin 3 MG Tabs Take 1 tablet by mouth at bedtime.   pantoprazole 40 MG tablet Commonly known as:  PROTONIX TAKE ONE TABLET BY MOUTH ONCE  DAILY   potassium chloride SA 20 MEQ tablet Commonly known as:  K-DUR,KLOR-CON Take 1 tablet (20 mEq total) by mouth daily as needed. Take one potassium pill when you take your lasix.   PROBIOTIC PO Take 1 capsule by mouth daily.   ranitidine 150 MG tablet Commonly known as:  ZANTAC Take 150 mg by mouth daily as needed for heartburn.   rosuvastatin 20 MG tablet Commonly known as:  CRESTOR Take 1 tablet (20 mg total) by mouth daily.   spironolactone 25 MG tablet Commonly known as:  ALDACTONE Take 1 tablet (25 mg total) by mouth daily. Take in the evening   temazepam 15 MG capsule Commonly known as:  RESTORIL TAKE ONE CAPSULE BY MOUTH ONCE DAILY AT BEDTIME   warfarin 5 MG tablet Commonly known as:  COUMADIN Take as directed by the anticoagulation clinic. If you are unsure how to  take this medication, talk to your nurse or doctor. Original instructions:  Take 10 mg by mouth daily.        Past Medical History:  Diagnosis Date  . Anxiety   . Automatic implantable cardioverter-defibrillator in situ   . CAD (coronary artery disease)    S/P stenting of LCX in 2004;  s/p Lat MI 2009 with occlusion of the LCX - treated with Promus stenting. Has total occlusion of the RCA.   [name] CHF (congestive heart failure) ([place])   . Hypercholesteremia   . Hypertension   . Hypothyroidism   . ICD (implantable cardiac defibrillator) in place    PROPHYLACTIC      medtronic  . Inferior MI ([place]) "? date"; 2009  . Ischemic cardiomyopathy    a. 10/09 Echo: Sev LV Dysfxn, inf/lat AK, Mod MR.  [name] LVAD (left ventricular assist device) present ([place])   . Osteoarthritis    a. s/p R TKR  . Pacemaker   . PVC (premature ventricular contraction)   . RBBB   . Shortness of breath     Past Surgical History:  Procedure Laterality Date  . CARPAL TUNNEL RELEASE Right 05/29/2016   Procedure: CARPAL TUNNEL RELEASE;  Surgeon: Earlie Server, MD;  Location: [facility];  Service: Orthopedics;  Laterality: Right;  . CORONARY ANGIOPLASTY WITH STENT PLACEMENT  2004  . CORONARY ANGIOPLASTY WITH STENT PLACEMENT  07/2008  . DEFIBRILLATOR  2009  . ESOPHAGOGASTRODUODENOSCOPY N/A 09/15/2013   Procedure: ESOPHAGOGASTRODUODENOSCOPY (EGD);  Surgeon: Ladene Artist, MD;  Location: [facility] ENDOSCOPY;  Service: Endoscopy;  Laterality: N/A;  bedside  . HEMATOMA EVACUATION Right 11/06/2013   Procedure: EVACUATION HEMATOMA;  Surgeon: Gaye Pollack, MD;  Location: [facility];  Service: Cardiothoracic;  Laterality: Right;  . INSERT / REPLACE / REMOVE PACEMAKER  2009  . INSERTION OF IMPLANTABLE LEFT VENTRICULAR ASSIST DEVICE N/A 09/18/2013   Procedure: INSERTION OF IMPLANTABLE LEFT VENTRICULAR ASSIST DEVICE;  Surgeon: Ivin Poot, MD;  Location: [facility];  Service: Open Heart Surgery;  Laterality: N/A;  CIRC ARREST  NITRIC OXIDE  .  INTRA-AORTIC BALLOON PUMP INSERTION N/A 09/14/2013   Procedure: INTRA-AORTIC BALLOON PUMP INSERTION;  Surgeon: Jolaine Artist, MD;  Location: [facility] CATH LAB;  Service: Cardiovascular;  Laterality: N/A;  . INTRAOPERATIVE TRANSESOPHAGEAL ECHOCARDIOGRAM N/A 09/18/2013   Procedure: INTRAOPERATIVE TRANSESOPHAGEAL ECHOCARDIOGRAM;  Surgeon: Ivin Poot, MD;  Location: [facility];  Service: Open Heart Surgery;  Laterality: N/A;  . KNEE ARTHROSCOPY     bilaterally  . KNEE ARTHROSCOPY W/ PARTIAL MEDIAL MENISCECTOMY  12/2002   left  . LEFT VENTRICULAR ASSIST  DEVICE    . RIGHT HEART CATHETERIZATION N/A 08/25/2013   Procedure: RIGHT HEART CATH;  Surgeon: Jolaine Artist, MD;  Location: [facility] CATH LAB;  Service: Cardiovascular;  Laterality: N/A;  . RIGHT HEART CATHETERIZATION N/A 09/13/2013   Procedure: RIGHT HEART CATH;  Surgeon: Jolaine Artist, MD;  Location: [facility] CATH LAB;  Service: Cardiovascular;  Laterality: N/A;  . RIGHT HEART CATHETERIZATION N/A 02/08/2014   Procedure: RIGHT HEART CATH;  Surgeon: Jolaine Artist, MD;  Location: [facility] CATH LAB;  Service: Cardiovascular;  Laterality: N/A;  . RIGHT HEART CATHETERIZATION N/A 02/12/2014   Procedure: RIGHT HEART CATH;  Surgeon: Jolaine Artist, MD;  Location: [facility] CATH LAB;  Service: Cardiovascular;  Laterality: N/A;  . TOTAL KNEE ARTHROPLASTY  11/27/11   left  . TOTAL KNEE ARTHROPLASTY  11/27/2011   Procedure: TOTAL KNEE ARTHROPLASTY;  Surgeon: Yvette Rack., MD;  Location: [facility];  Service: Orthopedics;  Laterality: Left;  left total knee arthroplasty  . VIDEO ASSISTED THORACOSCOPY Right 11/06/2013   Procedure: VIDEO ASSISTED THORACOSCOPY;  Surgeon: Gaye Pollack, MD;  Location: [facility] OR;  Service: Cardiothoracic;  Laterality: Right;    Family History  Problem Relation Age of Onset  . Heart failure Father   . Stroke Father   . Coronary artery disease Mother   . Heart disease Mother   . Hyperlipidemia Sister     Social History:  reports that he has  never smoked. He has never used smokeless tobacco. He reports that he does not drink alcohol or use drugs.  Allergies:  Allergies  Allergen Reactions  . Ace Inhibitors     Hypotension and elevated creatinine  . Diovan [Valsartan] Other (See Comments)    Hypotension at low dose, hyperkalemia  . Lipitor [Atorvastatin Calcium] Other (See Comments)    Muscle pain  . Norvasc [Amlodipine] Other (See Comments)    Put patient to sleep   ROS:  Cardiomyopathy: He is using a ventricle assist device and he is on anticoagulation  Appettite is normal, weight today was done with his battery pack  Wt Readings from Last 3 Encounters:  12/01/17 169 lb (76.7 kg)  11/15/17 164 lb 3.2 oz (74.5 kg)  09/16/17 164 lb (74.4 kg)      Examination:   BP 108/76 (BP Location: Left Arm, Patient Position: Sitting, Cuff Size: Normal)   Pulse 68   Ht 5\' 9"  (1.753 m)   Wt 169 lb (76.7 kg)   SpO2 97%   BMI 24.96 kg/m        Assessment/Plan:   Post ablative Hypothyroidism:  He is feeling fairly good with his current thyroxine replacement dosage of 125 g, 8 tablets a week The dose was increased slightly last September when his TSH was about 4 He is compliant with this regimen including taking extra half pill twice a week Since switching to 137 mcg daily will reduce his average dose will not change his prescription and he will continue the same regimen and follow-up in 6 months   Patient Instructions  Same dose     Elayne Snare 12/01/2017, 9:38 AM

## 2017-12-10 ENCOUNTER — Other Ambulatory Visit (HOSPITAL_COMMUNITY): Payer: Self-pay | Admitting: Unknown Physician Specialty

## 2017-12-10 DIAGNOSIS — Z95811 Presence of heart assist device: Secondary | ICD-10-CM

## 2017-12-10 DIAGNOSIS — Z7901 Long term (current) use of anticoagulants: Secondary | ICD-10-CM

## 2017-12-13 ENCOUNTER — Ambulatory Visit (HOSPITAL_COMMUNITY): Payer: Self-pay | Admitting: Pharmacist

## 2017-12-13 ENCOUNTER — Ambulatory Visit (HOSPITAL_COMMUNITY)
Admission: RE | Admit: 2017-12-13 | Discharge: 2017-12-13 | Disposition: A | Discharge status: Home / Self Care | Payer: Medicare Other | Source: Ambulatory Visit | Attending: Internal Medicine | Admitting: Internal Medicine

## 2017-12-13 DIAGNOSIS — Z95811 Presence of heart assist device: Secondary | ICD-10-CM | POA: Diagnosis not present

## 2017-12-13 DIAGNOSIS — Z7901 Long term (current) use of anticoagulants: Secondary | ICD-10-CM | POA: Diagnosis not present

## 2017-12-13 LAB — PROTIME-INR
INR: 3.36
Prothrombin Time: 33.7 seconds — ABNORMAL HIGH (ref 11.4–15.2)

## 2017-12-20 ENCOUNTER — Other Ambulatory Visit (HOSPITAL_COMMUNITY): Payer: Self-pay | Admitting: Internal Medicine

## 2017-12-20 DIAGNOSIS — I10 Essential (primary) hypertension: Secondary | ICD-10-CM

## 2017-12-27 ENCOUNTER — Ambulatory Visit (HOSPITAL_COMMUNITY): Payer: Self-pay | Admitting: Pharmacist

## 2017-12-27 ENCOUNTER — Other Ambulatory Visit (HOSPITAL_COMMUNITY): Payer: Self-pay | Admitting: *Deleted

## 2017-12-27 ENCOUNTER — Other Ambulatory Visit (HOSPITAL_COMMUNITY): Payer: Self-pay | Admitting: Internal Medicine

## 2017-12-27 ENCOUNTER — Ambulatory Visit (HOSPITAL_COMMUNITY)
Admission: RE | Admit: 2017-12-27 | Discharge: 2017-12-27 | Disposition: A | Discharge status: Home / Self Care | Payer: Medicare Other | Source: Ambulatory Visit | Attending: Cardiology | Admitting: Cardiology

## 2017-12-27 DIAGNOSIS — Z95811 Presence of heart assist device: Secondary | ICD-10-CM

## 2017-12-27 DIAGNOSIS — Z7901 Long term (current) use of anticoagulants: Secondary | ICD-10-CM | POA: Diagnosis not present

## 2017-12-27 DIAGNOSIS — I255 Ischemic cardiomyopathy: Secondary | ICD-10-CM

## 2017-12-27 LAB — PROTIME-INR
INR: 3.89
Prothrombin Time: 37.9 seconds — ABNORMAL HIGH (ref 11.4–15.2)

## 2017-12-30 ENCOUNTER — Other Ambulatory Visit (HOSPITAL_COMMUNITY): Payer: Self-pay | Admitting: Unknown Physician Specialty

## 2017-12-30 DIAGNOSIS — I255 Ischemic cardiomyopathy: Secondary | ICD-10-CM

## 2017-12-30 MED ORDER — CITALOPRAM HYDROBROMIDE 10 MG PO TABS: 10.0000 mg | ORAL_TABLET | Freq: Every day | ORAL | 3 refills | Status: DC

## 2017-12-31 ENCOUNTER — Other Ambulatory Visit (HOSPITAL_COMMUNITY): Payer: Self-pay | Admitting: Unknown Physician Specialty

## 2017-12-31 DIAGNOSIS — Z95811 Presence of heart assist device: Secondary | ICD-10-CM

## 2017-12-31 DIAGNOSIS — Z7901 Long term (current) use of anticoagulants: Secondary | ICD-10-CM

## 2018-01-03 ENCOUNTER — Ambulatory Visit (HOSPITAL_COMMUNITY): Payer: Self-pay | Admitting: Pharmacist

## 2018-01-03 ENCOUNTER — Ambulatory Visit (HOSPITAL_COMMUNITY)
Admission: RE | Admit: 2018-01-03 | Discharge: 2018-01-03 | Disposition: A | Discharge status: Home / Self Care | Payer: Medicare Other | Source: Ambulatory Visit | Attending: Internal Medicine | Admitting: Internal Medicine

## 2018-01-03 DIAGNOSIS — Z7901 Long term (current) use of anticoagulants: Secondary | ICD-10-CM | POA: Insufficient documentation

## 2018-01-03 DIAGNOSIS — Z95811 Presence of heart assist device: Secondary | ICD-10-CM | POA: Insufficient documentation

## 2018-01-03 LAB — PROTIME-INR
INR: 3.56
Prothrombin Time: 35.3 seconds — ABNORMAL HIGH (ref 11.4–15.2)

## 2018-01-17 ENCOUNTER — Ambulatory Visit (HOSPITAL_COMMUNITY): Payer: Self-pay | Admitting: Pharmacist

## 2018-01-17 ENCOUNTER — Other Ambulatory Visit (HOSPITAL_COMMUNITY): Payer: Self-pay | Admitting: Unknown Physician Specialty

## 2018-01-17 ENCOUNTER — Ambulatory Visit (HOSPITAL_COMMUNITY)
Admission: RE | Admit: 2018-01-17 | Discharge: 2018-01-17 | Disposition: A | Discharge status: Home / Self Care | Payer: Medicare Other | Source: Ambulatory Visit | Attending: Internal Medicine | Admitting: Internal Medicine

## 2018-01-17 VITALS — BP 82/0 | HR 77 | Ht 69.0 in | Wt 166.4 lb

## 2018-01-17 DIAGNOSIS — I10 Essential (primary) hypertension: Secondary | ICD-10-CM

## 2018-01-17 DIAGNOSIS — I2510 Atherosclerotic heart disease of native coronary artery without angina pectoris: Secondary | ICD-10-CM | POA: Insufficient documentation

## 2018-01-17 DIAGNOSIS — Z955 Presence of coronary angioplasty implant and graft: Secondary | ICD-10-CM | POA: Insufficient documentation

## 2018-01-17 DIAGNOSIS — Z96651 Presence of right artificial knee joint: Secondary | ICD-10-CM | POA: Insufficient documentation

## 2018-01-17 DIAGNOSIS — Z7901 Long term (current) use of anticoagulants: Secondary | ICD-10-CM | POA: Diagnosis not present

## 2018-01-17 DIAGNOSIS — I4510 Unspecified right bundle-branch block: Secondary | ICD-10-CM | POA: Diagnosis not present

## 2018-01-17 DIAGNOSIS — I110 Hypertensive heart disease with heart failure: Secondary | ICD-10-CM | POA: Diagnosis not present

## 2018-01-17 DIAGNOSIS — E039 Hypothyroidism, unspecified: Secondary | ICD-10-CM | POA: Diagnosis not present

## 2018-01-17 DIAGNOSIS — I255 Ischemic cardiomyopathy: Secondary | ICD-10-CM | POA: Insufficient documentation

## 2018-01-17 DIAGNOSIS — M1990 Unspecified osteoarthritis, unspecified site: Secondary | ICD-10-CM | POA: Insufficient documentation

## 2018-01-17 DIAGNOSIS — Z79899 Other long term (current) drug therapy: Secondary | ICD-10-CM | POA: Insufficient documentation

## 2018-01-17 DIAGNOSIS — E785 Hyperlipidemia, unspecified: Secondary | ICD-10-CM | POA: Diagnosis not present

## 2018-01-17 DIAGNOSIS — Z95811 Presence of heart assist device: Secondary | ICD-10-CM | POA: Diagnosis not present

## 2018-01-17 DIAGNOSIS — I252 Old myocardial infarction: Secondary | ICD-10-CM | POA: Insufficient documentation

## 2018-01-17 DIAGNOSIS — Z79890 Hormone replacement therapy (postmenopausal): Secondary | ICD-10-CM | POA: Insufficient documentation

## 2018-01-17 DIAGNOSIS — I5022 Chronic systolic (congestive) heart failure: Secondary | ICD-10-CM | POA: Insufficient documentation

## 2018-01-17 DIAGNOSIS — F419 Anxiety disorder, unspecified: Secondary | ICD-10-CM | POA: Diagnosis not present

## 2018-01-17 DIAGNOSIS — Z7982 Long term (current) use of aspirin: Secondary | ICD-10-CM | POA: Diagnosis not present

## 2018-01-17 LAB — LACTATE DEHYDROGENASE: LDH: 288 U/L — ABNORMAL HIGH (ref 98–192)

## 2018-01-17 LAB — CBC
HCT: 43.2 % (ref 39.0–52.0)
Hemoglobin: 14 g/dL (ref 13.0–17.0)
MCH: 29.4 pg (ref 26.0–34.0)
MCHC: 32.4 g/dL (ref 30.0–36.0)
MCV: 90.6 fL (ref 78.0–100.0)
Platelets: 146 10*3/uL — ABNORMAL LOW (ref 150–400)
RBC: 4.77 MIL/uL (ref 4.22–5.81)
RDW: 15.3 % (ref 11.5–15.5)
WBC: 5.1 10*3/uL (ref 4.0–10.5)

## 2018-01-17 LAB — BASIC METABOLIC PANEL
Anion gap: 10 (ref 5–15)
BUN: 20 mg/dL (ref 6–20)
CO2: 25 mmol/L (ref 22–32)
Calcium: 9.2 mg/dL (ref 8.9–10.3)
Chloride: 104 mmol/L (ref 101–111)
Creatinine, Ser: 1.44 mg/dL — ABNORMAL HIGH (ref 0.61–1.24)
GFR calc Af Amer: 54 mL/min — ABNORMAL LOW (ref >60)
GFR calc non Af Amer: 46 mL/min — ABNORMAL LOW (ref >60)
Glucose, Bld: 92 mg/dL (ref 65–99)
Potassium: 4.5 mmol/L (ref 3.5–5.1)
Sodium: 139 mmol/L (ref 135–145)

## 2018-01-17 LAB — PROTIME-INR
INR: 3.06
Prothrombin Time: 31.4 seconds — ABNORMAL HIGH (ref 11.4–15.2)

## 2018-01-17 NOTE — Progress Notes (Signed)
Patient presents for 2 month follow up in [facility] today. Reports no problems with VAD equipment or concerns with drive line.   Vital Signs:  Doppler Pressure: 82  Automatc BP:  96/70 (80) HR:  77 SPO2: 96%  Weight: 166.4 lb w/o eqt Last weight: 164.2 lb  VAD Indication: Destination therapy- patient choice    VAD interrogation & Equipment Management: Speed: 9200 Flow: 5.0 Power:5.4 w    PI:  6.6  Alarms: few Low Voltage Events: 0 - 10 PI events daily  Fixed speed 9200 Low speed limit: 8600  Primary Controller:  Replace back up battery in 32 months Back up controller:   Replace back up battery in 32 months  Annual Equipment Maintenance on UBC/PM was performed on 08/2017  I reviewed the LVAD parameters from today and compared the results to the patient's prior recorded data. LVAD interrogation was NEGATIVE for significant power changes, NEGATIVE for clinical alarms and STABLE for PI events/speed drops. No programming changes were made and pump is functioning within specified parameters. Pt is performing daily controller and system monitor self tests along with completing weekly and monthly maintenance for LVAD equipment.  LVAD equipment check completed and is in good working order. Back-up equipment present. Charged back up battery today.  Exit Site Care: Drive line is being maintained weekly by [name], his wife. Sorbaview dressing dry and intact. Stabilization device present and accurately applied. Pt denies fever or chills. Pt has CHG allergy. Pt provided with 4 weekly dressing kits and 5 extra anchors.  Significant Events on VAD Support:  10/2013>SBO 12/2016> elevated LDH, admit for Bival  Device:Medtronic ERI Therapies: off Last check:  09/23/17  BP & Labs:  Doppler BP 82  - Doppler is reflecting modified systolic  Hgb 24.5 - No S/S of bleeding. Specifically denies melena/BRBPR or nosebleeds.  LDH stable at 288 with established baseline of 200- 250. Denies  tea-colored urine. No power elevations noted on interrogation.   Patient Instructions:  1. No change in medications. 2. Recheck LDH at next INR check.  3.  Return to clinic in 2 months  Tanda Rockers RN [place] Coordinator   Office: [phone] 24/7 Emergency [place] Pager: [phone]

## 2018-01-18 NOTE — Progress Notes (Signed)
VAD CLINIC NOTE   HPI:  Johnny Oneill is a 77 y.o. 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   Admitted 3/4-3/25/15 with SOB and syncopal episode. Found to have SBO and NG placed and started on TPN. On 11/06/13 developed progressive SOB and hypotension and co-ox 51% and Hgb 6.7. Started on milrinone and transfused. Echo showed nl RV function. Patient had PEA arrest and was intubated and started on pressors. CXR revealed a R hemothorax and a R CT placed. He required VATs and evacuation of hemothorax and was then tansferred to CIR.   LVAD Issues  Elevated LDH--bivalirudin. D/C LDH 188  He presents today for regular follow up. Continues to do very well. [place] got [place] from the beach for his [place]. Remains active. Takes lasix as needed. Denies orthopnea or PND. No fevers, chills or problems with driveline. No bleeding, melena or neuro symptoms. No VAD alarms. Taking all meds as prescribed.    Echo 3/18 EF 10-15%. RV moderate HK. LVAD cannula ok. Mild AI  Echo 9/16 EF 20%, LVAD cannula ok moderate RV dilation with mild to moderately decreased RV systolic function.   VAD Indication: Destination therapy- patient choice    VAD interrogation & Equipment Management: Speed: 9200 Flow: 5.0 Power:5.4 w PI:  6.6  Alarms: few Low Voltage Events: 0 - 10 PI events daily  Fixed speed 9200 Low speed limit: 8600  Primary Controller: Replace back up battery in 32 months Back up controller: Replace back up battery in 32 months  Annual Equipment Maintenance on UBC/PM was performed on 08/2017  Past Medical History:  Diagnosis Date  . Anxiety   . Automatic implantable cardioverter-defibrillator in situ   . CAD (coronary artery disease)    S/P stenting of LCX in 2004;  s/p Lat MI 2009 with occlusion of the LCX - treated with Promus stenting. Has total occlusion of the RCA.   [name] CHF (congestive  heart failure) ([place])   . Hypercholesteremia   . Hypertension   . Hypothyroidism   . ICD (implantable cardiac defibrillator) in place    PROPHYLACTIC      medtronic  . Inferior MI ([place]) "? date"; 2009  . Ischemic cardiomyopathy    a. 10/09 Echo: Sev LV Dysfxn, inf/lat AK, Mod MR.  [name] LVAD (left ventricular assist device) present ([place])   . Osteoarthritis    a. s/p R TKR  . Pacemaker   . PVC (premature ventricular contraction)   . RBBB   . Shortness of breath     Current Outpatient Medications  Medication Sig Dispense Refill  . aspirin EC 325 MG tablet Take 325 mg by mouth daily.    . citalopram (CELEXA) 10 MG tablet Take 1 tablet (10 mg total) by mouth daily. 90 tablet 3  . Coenzyme Q10 (COQ10) 200 MG CAPS Take 200 mg by mouth daily.    . hydrALAZINE (APRESOLINE) 50 MG tablet TAKE ONE TABLET BY MOUTH THREE TIMES DAILY 270 tablet 3  . levothyroxine (SYNTHROID, LEVOTHROID) 125 MCG tablet 1 tablet daily and 1/2 tablet on Sundays and Wednesdays    . Melatonin 3 MG TABS Take 1 tablet by mouth at bedtime.    . pantoprazole (PROTONIX) 40 MG tablet TAKE ONE TABLET BY MOUTH ONCE DAILY 90 tablet 3  . Probiotic Product (PROBIOTIC PO) Take 1 capsule by mouth daily.    . ranitidine (ZANTAC) 150 MG tablet  Take 150 mg by mouth daily as needed for heartburn.    . rosuvastatin (CRESTOR) 20 MG tablet Take 1 tablet (20 mg total) by mouth daily. 90 tablet 3  . spironolactone (ALDACTONE) 25 MG tablet Take 1 tablet (25 mg total) by mouth daily. Take in the evening 30 tablet 3  . temazepam (RESTORIL) 15 MG capsule TAKE ONE CAPSULE BY MOUTH ONCE DAILY AT BEDTIME 30 capsule 0  . warfarin (COUMADIN) 5 MG tablet Take 10 mg by mouth daily.     [name] acetaminophen (TYLENOL) 500 MG chewable tablet Chew 500 mg by mouth every 6 (six) hours as needed for pain.    . furosemide (LASIX) 20 MG tablet Take 20 mg by mouth daily as needed (Take 20 mg as daily as needed for wt > 153 lbs). Reported on 01/14/2016    . potassium  chloride SA (K-DUR,KLOR-CON) 20 MEQ tablet Take 1 tablet (20 mEq total) by mouth daily as needed. Take one potassium pill when you take your lasix. (Patient not taking: Reported on 01/17/2018) 90 tablet 3   No current facility-administered medications for this encounter.    Facility-Administered Medications Ordered in Other Encounters  Medication Dose Route Frequency Provider Last Rate Last Dose  . etomidate (AMIDATE) injection    Anesthesia Intra-op Myna Bright, CRNA   20 mg at 02/08/14 0915  . rocuronium [facility]) injection    Anesthesia Intra-op Myna Bright, CRNA   50 mg at 02/08/14 0932  . succinylcholine (ANECTINE) injection    Anesthesia Intra-op Myna Bright, CRNA   100 mg at 02/08/14 0915   Ace inhibitors; Diovan [valsartan]; Lipitor [atorvastatin calcium]; and Norvasc [amlodipine]  Review of systems complete and found to be negative unless listed in HPI.     Vital Signs:  Doppler Pressure: 82   Automatc BP:  96/70 (80) HR:  77 SPO2: 96%  Weight: 166.4 lb w/o eqt Last weight: 164.2 lb   Vital signs: Vitals:   01/17/18 1122 01/17/18 1133  BP: 96/70 (!) 82/0  Pulse: 77   SpO2: 96%   Weight: 166 lb 6.4 oz (75.5 kg)   Height: 5\' 9"  (1.753 m)    Physical Exam: General:  NAD.  HEENT: normal  Neck: supple. JVP not elevated.  Carotids 2+ bilat; no bruits. No lymphadenopathy or thryomegaly appreciated. Cor: LVAD hum.  Lungs: Clear. Abdomen: soft, nontender, non-distended. No hepatosplenomegaly. No bruits or masses. Good bowel sounds. Driveline site clean. Anchor in place.  Extremities: no cyanosis, clubbing, rash. Warm no edema  Neuro: alert & oriented x 3. No focal deficits. Moves all 4 without problem   ASSESSMENT AND PLAN:  1) Chronic systolic HF: ICM, EF 29-56% s/p LVAD HM II implant 08/2013 for DT.   - Echo 3/18 EF 10-15% mild AI. Mod RV dysfunction. LVAD cannula OK - Continues to do very well. NYHA I   - Volume status stable. Continue spiro.  Using lasix only prn.  - He has not tolerated ACE-I or amlodipine in the past. - ICD at [facility]. Has seen Dr. Rayann Heman in 2/18 and decided not to replace.   2) LVAD: HMII 08/2013 for DT. - VAD interrogated personally. Parameters stable. - Admitted in May 2018 with elevated LDH with bival.  - LDH 288. No power spikes.  - Continue aspirin and coumadin.  - Hgb 14.0 3) Chronic anticoagulation: INR goal 2.0-3.0.   No bleeding issues.   - INR 3.06 today. Discussed dosing with PharmD personally. - Continue ASA 325 mg  for LVAD + warfarin.  4) HTN:  - Blood pressure well controlled. Continue current regimen. 5) Hypothyroidism:  - Followed by Dr Dwyane Dee.  6) Hyperlipidemia:  - Continue Crestor. Zetia stopped due to cost.  7) CAD -No s/s of ischemia.      Glori Bickers, MD  8:42 AM

## 2018-01-28 ENCOUNTER — Other Ambulatory Visit (HOSPITAL_COMMUNITY): Payer: Self-pay | Admitting: Unknown Physician Specialty

## 2018-01-28 DIAGNOSIS — Z7901 Long term (current) use of anticoagulants: Secondary | ICD-10-CM

## 2018-01-28 DIAGNOSIS — Z95811 Presence of heart assist device: Secondary | ICD-10-CM

## 2018-01-31 ENCOUNTER — Ambulatory Visit (HOSPITAL_COMMUNITY)
Admission: RE | Admit: 2018-01-31 | Discharge: 2018-01-31 | Disposition: A | Discharge status: Home / Self Care | Payer: Medicare Other | Source: Ambulatory Visit | Attending: Internal Medicine | Admitting: Internal Medicine

## 2018-01-31 ENCOUNTER — Ambulatory Visit (HOSPITAL_COMMUNITY): Payer: Self-pay | Admitting: Pharmacist

## 2018-01-31 DIAGNOSIS — Z7901 Long term (current) use of anticoagulants: Secondary | ICD-10-CM | POA: Diagnosis not present

## 2018-01-31 DIAGNOSIS — Z95811 Presence of heart assist device: Secondary | ICD-10-CM | POA: Insufficient documentation

## 2018-01-31 LAB — PROTIME-INR
INR: 3.8
Prothrombin Time: 37.2 seconds — ABNORMAL HIGH (ref 11.4–15.2)

## 2018-01-31 LAB — LACTATE DEHYDROGENASE: LDH: 267 U/L — ABNORMAL HIGH (ref 98–192)

## 2018-02-07 ENCOUNTER — Other Ambulatory Visit (HOSPITAL_COMMUNITY): Payer: Self-pay | Admitting: Pharmacist

## 2018-02-07 ENCOUNTER — Ambulatory Visit (HOSPITAL_COMMUNITY)
Admission: RE | Admit: 2018-02-07 | Discharge: 2018-02-07 | Disposition: A | Discharge status: Home / Self Care | Payer: Medicare Other | Source: Ambulatory Visit | Attending: Cardiology | Admitting: Cardiology

## 2018-02-07 ENCOUNTER — Ambulatory Visit (HOSPITAL_COMMUNITY): Payer: Self-pay | Admitting: Pharmacist

## 2018-02-07 DIAGNOSIS — Z09 Encounter for follow-up examination after completed treatment for conditions other than malignant neoplasm: Secondary | ICD-10-CM | POA: Diagnosis not present

## 2018-02-07 DIAGNOSIS — Z95811 Presence of heart assist device: Secondary | ICD-10-CM

## 2018-02-07 LAB — PROTIME-INR
INR: 3.11
Prothrombin Time: 31.8 seconds — ABNORMAL HIGH (ref 11.4–15.2)

## 2018-02-07 MED ORDER — FLUCONAZOLE 100 MG PO TABS: 100.0000 mg | ORAL_TABLET | Freq: Every day | ORAL | 0 refills | Status: AC

## 2018-02-11 ENCOUNTER — Other Ambulatory Visit (HOSPITAL_COMMUNITY): Payer: Self-pay | Admitting: Unknown Physician Specialty

## 2018-02-11 DIAGNOSIS — Z95811 Presence of heart assist device: Secondary | ICD-10-CM

## 2018-02-14 ENCOUNTER — Ambulatory Visit (HOSPITAL_COMMUNITY): Payer: Self-pay | Admitting: Pharmacist

## 2018-02-14 ENCOUNTER — Ambulatory Visit (HOSPITAL_COMMUNITY)
Admission: RE | Admit: 2018-02-14 | Discharge: 2018-02-14 | Disposition: A | Discharge status: Home / Self Care | Payer: Medicare Other | Source: Ambulatory Visit | Attending: Cardiology | Admitting: Cardiology

## 2018-02-14 DIAGNOSIS — Z95811 Presence of heart assist device: Secondary | ICD-10-CM | POA: Insufficient documentation

## 2018-02-14 LAB — PROTIME-INR
INR: 4.29
Prothrombin Time: 40.8 seconds — ABNORMAL HIGH (ref 11.4–15.2)

## 2018-02-14 MED ORDER — WARFARIN SODIUM 5 MG PO TABS: 10.0000 mg | ORAL_TABLET | Freq: Every day | ORAL | 11 refills | Status: DC

## 2018-02-14 NOTE — Addendum Note (Signed)
Encounter addended by: Effie Berkshire, RN on: 02/14/2018 10:32 AM  Actions taken: Pharmacy for encounter modified, Order list changed

## 2018-02-16 ENCOUNTER — Other Ambulatory Visit: Payer: Self-pay | Admitting: Endocrinology

## 2018-02-17 ENCOUNTER — Other Ambulatory Visit (HOSPITAL_COMMUNITY): Payer: Self-pay | Admitting: Unknown Physician Specialty

## 2018-02-17 MED ORDER — WARFARIN SODIUM 5 MG PO TABS: 10.0000 mg | ORAL_TABLET | Freq: Every day | ORAL | 11 refills | Status: DC

## 2018-02-18 ENCOUNTER — Other Ambulatory Visit (HOSPITAL_COMMUNITY): Payer: Self-pay | Admitting: Unknown Physician Specialty

## 2018-02-18 DIAGNOSIS — Z7901 Long term (current) use of anticoagulants: Secondary | ICD-10-CM

## 2018-02-18 DIAGNOSIS — Z95811 Presence of heart assist device: Secondary | ICD-10-CM

## 2018-02-19 ENCOUNTER — Other Ambulatory Visit (HOSPITAL_COMMUNITY): Payer: Self-pay | Admitting: Cardiology

## 2018-02-19 DIAGNOSIS — K219 Gastro-esophageal reflux disease without esophagitis: Secondary | ICD-10-CM

## 2018-02-22 ENCOUNTER — Ambulatory Visit (HOSPITAL_COMMUNITY)
Admission: RE | Admit: 2018-02-22 | Discharge: 2018-02-22 | Disposition: A | Discharge status: Home / Self Care | Payer: Medicare Other | Source: Ambulatory Visit | Attending: Internal Medicine | Admitting: Internal Medicine

## 2018-02-22 ENCOUNTER — Ambulatory Visit (HOSPITAL_COMMUNITY): Payer: Self-pay | Admitting: Pharmacist

## 2018-02-22 DIAGNOSIS — Z7901 Long term (current) use of anticoagulants: Secondary | ICD-10-CM

## 2018-02-22 DIAGNOSIS — Z95811 Presence of heart assist device: Secondary | ICD-10-CM | POA: Diagnosis not present

## 2018-02-22 LAB — PROTIME-INR
INR: 3.03
Prothrombin Time: 31.1 seconds — ABNORMAL HIGH (ref 11.4–15.2)

## 2018-03-07 ENCOUNTER — Other Ambulatory Visit (HOSPITAL_COMMUNITY): Payer: Self-pay | Admitting: *Deleted

## 2018-03-07 DIAGNOSIS — Z95811 Presence of heart assist device: Secondary | ICD-10-CM

## 2018-03-07 DIAGNOSIS — Z7901 Long term (current) use of anticoagulants: Secondary | ICD-10-CM

## 2018-03-08 ENCOUNTER — Ambulatory Visit (HOSPITAL_COMMUNITY): Payer: Self-pay | Admitting: Pharmacist

## 2018-03-08 ENCOUNTER — Ambulatory Visit (HOSPITAL_COMMUNITY)
Admission: RE | Admit: 2018-03-08 | Discharge: 2018-03-08 | Disposition: A | Discharge status: Home / Self Care | Payer: Medicare Other | Source: Ambulatory Visit | Attending: Cardiology | Admitting: Cardiology

## 2018-03-08 DIAGNOSIS — Z95811 Presence of heart assist device: Secondary | ICD-10-CM | POA: Diagnosis not present

## 2018-03-08 DIAGNOSIS — Z7901 Long term (current) use of anticoagulants: Secondary | ICD-10-CM

## 2018-03-08 LAB — PROTIME-INR
INR: 3.13
Prothrombin Time: 32 seconds — ABNORMAL HIGH (ref 11.4–15.2)

## 2018-03-18 ENCOUNTER — Other Ambulatory Visit (HOSPITAL_COMMUNITY): Payer: Self-pay | Admitting: Unknown Physician Specialty

## 2018-03-18 DIAGNOSIS — Z95811 Presence of heart assist device: Secondary | ICD-10-CM

## 2018-03-18 DIAGNOSIS — Z7901 Long term (current) use of anticoagulants: Secondary | ICD-10-CM

## 2018-03-21 ENCOUNTER — Encounter (HOSPITAL_COMMUNITY): Payer: Self-pay

## 2018-03-21 ENCOUNTER — Ambulatory Visit (HOSPITAL_COMMUNITY)
Admission: RE | Admit: 2018-03-21 | Discharge: 2018-03-21 | Disposition: A | Discharge status: Home / Self Care | Payer: Medicare Other | Source: Ambulatory Visit | Attending: Cardiology | Admitting: Cardiology

## 2018-03-21 ENCOUNTER — Ambulatory Visit (HOSPITAL_COMMUNITY): Payer: Self-pay | Admitting: Pharmacist

## 2018-03-21 VITALS — BP 95/72 | HR 67 | Ht 69.0 in | Wt 164.6 lb

## 2018-03-21 DIAGNOSIS — I4510 Unspecified right bundle-branch block: Secondary | ICD-10-CM | POA: Diagnosis not present

## 2018-03-21 DIAGNOSIS — I110 Hypertensive heart disease with heart failure: Secondary | ICD-10-CM | POA: Insufficient documentation

## 2018-03-21 DIAGNOSIS — E039 Hypothyroidism, unspecified: Secondary | ICD-10-CM | POA: Diagnosis not present

## 2018-03-21 DIAGNOSIS — Z7982 Long term (current) use of aspirin: Secondary | ICD-10-CM | POA: Diagnosis not present

## 2018-03-21 DIAGNOSIS — E7800 Pure hypercholesterolemia, unspecified: Secondary | ICD-10-CM | POA: Diagnosis not present

## 2018-03-21 DIAGNOSIS — F419 Anxiety disorder, unspecified: Secondary | ICD-10-CM | POA: Insufficient documentation

## 2018-03-21 DIAGNOSIS — Z4509 Encounter for adjustment and management of other cardiac device: Secondary | ICD-10-CM | POA: Insufficient documentation

## 2018-03-21 DIAGNOSIS — I5022 Chronic systolic (congestive) heart failure: Secondary | ICD-10-CM | POA: Insufficient documentation

## 2018-03-21 DIAGNOSIS — Z955 Presence of coronary angioplasty implant and graft: Secondary | ICD-10-CM | POA: Insufficient documentation

## 2018-03-21 DIAGNOSIS — Z79890 Hormone replacement therapy (postmenopausal): Secondary | ICD-10-CM | POA: Diagnosis not present

## 2018-03-21 DIAGNOSIS — Z79899 Other long term (current) drug therapy: Secondary | ICD-10-CM | POA: Diagnosis not present

## 2018-03-21 DIAGNOSIS — Z95811 Presence of heart assist device: Secondary | ICD-10-CM | POA: Diagnosis not present

## 2018-03-21 DIAGNOSIS — Z7901 Long term (current) use of anticoagulants: Secondary | ICD-10-CM | POA: Diagnosis not present

## 2018-03-21 DIAGNOSIS — I10 Essential (primary) hypertension: Secondary | ICD-10-CM

## 2018-03-21 DIAGNOSIS — I2510 Atherosclerotic heart disease of native coronary artery without angina pectoris: Secondary | ICD-10-CM | POA: Diagnosis not present

## 2018-03-21 DIAGNOSIS — R740 Nonspecific elevation of levels of transaminase and lactic acid dehydrogenase [LDH]: Secondary | ICD-10-CM | POA: Diagnosis not present

## 2018-03-21 DIAGNOSIS — Z95810 Presence of automatic (implantable) cardiac defibrillator: Secondary | ICD-10-CM | POA: Diagnosis not present

## 2018-03-21 DIAGNOSIS — I255 Ischemic cardiomyopathy: Secondary | ICD-10-CM | POA: Diagnosis not present

## 2018-03-21 DIAGNOSIS — I252 Old myocardial infarction: Secondary | ICD-10-CM | POA: Insufficient documentation

## 2018-03-21 LAB — COMPREHENSIVE METABOLIC PANEL
ALT: 16 U/L (ref 0–44)
AST: 25 U/L (ref 15–41)
Albumin: 3.9 g/dL (ref 3.5–5.0)
Alkaline Phosphatase: 65 U/L (ref 38–126)
Anion gap: 9 (ref 5–15)
BUN: 17 mg/dL (ref 8–23)
CO2: 24 mmol/L (ref 22–32)
Calcium: 9.1 mg/dL (ref 8.9–10.3)
Chloride: 110 mmol/L (ref 98–111)
Creatinine, Ser: 1.2 mg/dL (ref 0.61–1.24)
GFR calc Af Amer: >60 mL/min (ref >60)
GFR calc non Af Amer: 58 mL/min — ABNORMAL LOW (ref >60)
Glucose, Bld: 88 mg/dL (ref 70–99)
Potassium: 4.4 mmol/L (ref 3.5–5.1)
Sodium: 143 mmol/L (ref 135–145)
Total Bilirubin: 0.9 mg/dL (ref 0.3–1.2)
Total Protein: 6.5 g/dL (ref 6.5–8.1)

## 2018-03-21 LAB — PROTIME-INR
INR: 2.87
Prothrombin Time: 29.9 seconds — ABNORMAL HIGH (ref 11.4–15.2)

## 2018-03-21 LAB — CBC
HCT: 43.5 % (ref 39.0–52.0)
Hemoglobin: 13.9 g/dL (ref 13.0–17.0)
MCH: 29.7 pg (ref 26.0–34.0)
MCHC: 32 g/dL (ref 30.0–36.0)
MCV: 92.9 fL (ref 78.0–100.0)
Platelets: 130 10*3/uL — ABNORMAL LOW (ref 150–400)
RBC: 4.68 MIL/uL (ref 4.22–5.81)
RDW: 15.5 % (ref 11.5–15.5)
WBC: 5.3 10*3/uL (ref 4.0–10.5)

## 2018-03-21 LAB — PREALBUMIN: Prealbumin: 18.2 mg/dL (ref 18–38)

## 2018-03-21 LAB — LACTATE DEHYDROGENASE: LDH: 262 U/L — ABNORMAL HIGH (ref 98–192)

## 2018-03-21 NOTE — Progress Notes (Addendum)
VAD CLINIC NOTE   HPI:  Johnny Oneill is a 77 y.o. 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   Admitted 3/4-3/25/15 with SOB and syncopal episode. Found to have SBO and NG placed and started on TPN. On 11/06/13 developed progressive SOB and hypotension and co-ox 51% and Hgb 6.7. Started on milrinone and transfused. Echo showed nl RV function. Patient had PEA arrest and was intubated and started on pressors. CXR revealed a R hemothorax and a R CT placed. He required VATs and evacuation of hemothorax and was then teansferred to CIR.   LVAD Issues  Elevated LDH--bivalirudin. D/C LDH 188  He presents today for regular follow up. Continues to do very well. Active. Exercising regularly without any issues. Denies orthopnea or PND. No fevers, chills or problems with driveline. No bleeding, melena or neuro symptoms. No VAD alarms. Taking all meds as prescribed.    Echo 3/18 EF 10-15%. RV moderate HK. LVAD cannula ok. Mild AI  Echo 9/16 EF 20%, LVAD cannula ok moderate RV dilation with mild to moderately decreased RV systolic function.   VAD Indication: Destination therapy- patient choice    VAD interrogation & Equipment Management: Speed: 9200 Flow:  4.3 Power:5.1 w PI:  7.7  Alarms: few Low Voltage Events: 0 - 10 PI events daily  Fixed speed 9200  Low speed limit: 8600    Past Medical History:  Diagnosis Date  . Anxiety   . Automatic implantable cardioverter-defibrillator in situ   . CAD (coronary artery disease)    S/P stenting of LCX in 2004;  s/p Lat MI 2009 with occlusion of the LCX - treated with Promus stenting. Has total occlusion of the RCA.   [name] CHF (congestive heart failure) ([place])   . Hypercholesteremia   . Hypertension   . Hypothyroidism   . ICD (implantable cardiac defibrillator) in place    PROPHYLACTIC      medtronic  . Inferior MI ([place]) "? date"; 2009  . Ischemic  cardiomyopathy    a. 10/09 Echo: Sev LV Dysfxn, inf/lat AK, Mod MR.  [name] LVAD (left ventricular assist device) present ([place])   . Osteoarthritis    a. s/p R TKR  . Pacemaker   . PVC (premature ventricular contraction)   . RBBB   . Shortness of breath     Current Outpatient Medications  Medication Sig Dispense Refill  . acetaminophen (TYLENOL) 500 MG chewable tablet Chew 500 mg by mouth every 6 (six) hours as needed for pain.    [name] aspirin EC 325 MG tablet Take 325 mg by mouth daily.    . citalopram (CELEXA) 10 MG tablet Take 1 tablet (10 mg total) by mouth daily. 90 tablet 3  . Coenzyme Q10 (COQ10) 200 MG CAPS Take 200 mg by mouth daily.    . hydrALAZINE (APRESOLINE) 50 MG tablet TAKE ONE TABLET BY MOUTH THREE TIMES DAILY 270 tablet 3  . levothyroxine (SYNTHROID, LEVOTHROID) 125 MCG tablet 1 tablet daily and 1/2 tablet on Sundays and Wednesdays    . levothyroxine (SYNTHROID, LEVOTHROID) 125 MCG tablet TAKE 1 TABLET BY MOUTH ONCE DAILY 90 tablet 3  . Melatonin 3 MG TABS Take 1 tablet by mouth at bedtime.    . pantoprazole (PROTONIX) 40 MG tablet TAKE 1 TABLET BY MOUTH ONCE DAILY 90 tablet 1  . potassium chloride SA (K-DUR,KLOR-CON) 20 MEQ tablet Take 1 tablet (20 mEq total)  by mouth daily as needed. Take one potassium pill when you take your lasix. 90 tablet 3  . Probiotic Product (PROBIOTIC PO) Take 1 capsule by mouth daily.    . ranitidine (ZANTAC) 150 MG tablet Take 150 mg by mouth daily as needed for heartburn.    . rosuvastatin (CRESTOR) 20 MG tablet Take 1 tablet (20 mg total) by mouth daily. 90 tablet 3  . spironolactone (ALDACTONE) 25 MG tablet Take 1 tablet (25 mg total) by mouth daily. Take in the evening 30 tablet 3  . temazepam (RESTORIL) 15 MG capsule TAKE ONE CAPSULE BY MOUTH ONCE DAILY AT BEDTIME 30 capsule 0  . warfarin (COUMADIN) 5 MG tablet Take 2 tablets (10 mg total) by mouth daily. 5mg  MON/WED 10mg  AOD 60 tablet 11  . furosemide (LASIX) 20 MG tablet Take 20 mg by mouth  daily as needed (Take 20 mg as daily as needed for wt > 153 lbs). Reported on 01/14/2016     No current facility-administered medications for this encounter.    Facility-Administered Medications Ordered in Other Encounters  Medication Dose Route Frequency Provider Last Rate Last Dose  . etomidate (AMIDATE) injection    Anesthesia Intra-op Myna Bright, CRNA   20 mg at 02/08/14 0915  . rocuronium [facility]) injection    Anesthesia Intra-op Myna Bright, CRNA   50 mg at 02/08/14 0932  . succinylcholine (ANECTINE) injection    Anesthesia Intra-op Myna Bright, CRNA   100 mg at 02/08/14 0915   Ace inhibitors; Diovan [valsartan]; Lipitor [atorvastatin calcium]; and Norvasc [amlodipine]  Review of systems complete and found to be negative unless listed in HPI.      Vital Signs:  Doppler Pressure: 92 Automatc BP:  95/72 (82) HR:  67 SPO2: 97% on RA  Weight: 164.6 lb w/o eqt Last weight: 166.4 lb    Vital signs: Vitals:   03/21/18 1046 03/21/18 1047  BP: (!) 92/0 95/72  Pulse:  67  SpO2:  97%  Weight:  164 lb 9.6 oz (74.7 kg)  Height:  5\' 9"  (1.753 m)   General:  NAD.  HEENT: normal  Neck: supple. JVP not elevated.  Carotids 2+ bilat; no bruits. No lymphadenopathy or thryomegaly appreciated. Cor: LVAD hum.  Lungs: Clear. Abdomen: soft, nontender, non-distended. No hepatosplenomegaly. No bruits or masses. Good bowel sounds. Driveline site clean. Anchor in place.  Extremities: no cyanosis, clubbing, rash. Warm no edema  Neuro: alert & oriented x 3. No focal deficits. Moves all 4 without problem    ASSESSMENT AND PLAN:  1) Chronic systolic HF: ICM, EF 23-76% s/p LVAD HM II implant 08/2013 for DT.   - Echo 3/18 EF 10-15% mild AI. Mod RV dysfunction. LVAD cannula OK - Continues to do very well. NYHA I - Volume status stable. Continue spiro. Using lasix only prn.  - He has not tolerated ACE-I or amlodipine in the past. - ICD at [facility]. Has seen Dr. Rayann Heman in  2/18 and decided not to replace.   2) LVAD: HMII 08/2013 for DT. - VAD interrogated personally. Parameters stable. - Admitted in May 2018 with elevated LDH with bival.  - LDH 262. No power spikes.  - Continue aspirin and coumadin.  - Hgb stable 13.9 - 6MW today   3) Chronic anticoagulation: INR goal 2.0-3.0.   No bleeding issues.   - INR 2.87 today. Discussed dosing with PharmD personally. - Continue ASA 325 mg for LVAD + warfarin.  4) HTN:  - Blood pressure  well controlled. Continue current regimen.[name] 5) Hypothyroidism:  - Followed by Dr Dwyane Dee.  6) Hyperlipidemia:  - Continue Crestor. Zetia stopped due to cost.  7) CAD -No s/s of ischemia    Glori Bickers, MD  11:48 AM

## 2018-03-21 NOTE — Progress Notes (Signed)
Patient presents for 2 month follow up in [facility] today. Reports no problems with VAD equipment or concerns with drive line.   Vital Signs:  Doppler Pressure: 92 Automatc BP:  95/72 (82) HR:  67 SPO2: 97% on RA  Weight: 164.6 lb w/o eqt Last weight: 166.4 lb  VAD Indication: Destination therapy- patient choice    VAD interrogation & Equipment Management: Speed: 9200 Flow:  4.3 Power:5.1 w    PI:  7.7  Alarms: few Low Voltage Events: 0 - 10 PI events daily  Fixed speed 9200  Low speed limit: 8600  Primary Controller:  Replace back up battery in 30 months Back up controller:  Did not bring back up bag  Annual Equipment Maintenance on UBC/PM was performed on 08/2017  I reviewed the LVAD parameters from today and compared the results to the patient's prior recorded data. LVAD interrogation was NEGATIVE for significant power changes, NEGATIVE for clinical alarms and STABLE for PI events/speed drops. No programming changes were made and pump is functioning within specified parameters. Pt is performing daily controller and system monitor self tests along with completing weekly and monthly maintenance for LVAD equipment.  Exit Site Care: Drive line is being maintained every three days using gauze dressing during heat of summer by [name], his wife. Gauze dressing dry and intact. Stabilization device present and accurately applied. Pt denies fever or chills. Pt has CHG allergy. Pt provided with 8 daily dressing kits and 5 extra anchors.  Significant Events on VAD Support:  10/2013>SBO 12/2016> elevated LDH, admit for Bival  Device:Medtronic ERI Therapies: off Last check:  09/23/17  BP & Labs:  Doppler BP 92 - Doppler is reflecting modified systolic  Hgb 77.4 - No S/S of bleeding. Specifically denies melena/BRBPR or nosebleeds.  LDH 262 with established baseline of 200- 250. Denies tea-colored urine. No power elevations noted on interrogation.   4.5 year Intermacs  follow up completed including:  Quality of Life, KCCQ-12, and Neurocognitive trail making.   Pt completed 1620 feet during 6 minute walk.  Patient Instructions:  1. No change in medications. 3.  Return to clinic in 2 months  [place] Coordinator   Office: [phone] 24/7 Emergency [place] Pager: [phone]

## 2018-04-04 ENCOUNTER — Other Ambulatory Visit (HOSPITAL_COMMUNITY): Payer: Self-pay | Admitting: Unknown Physician Specialty

## 2018-04-04 ENCOUNTER — Inpatient Hospital Stay (HOSPITAL_COMMUNITY): Admission: RE | Admit: 2018-04-04 | Payer: Medicare Other | Source: Ambulatory Visit

## 2018-04-04 DIAGNOSIS — Z95811 Presence of heart assist device: Secondary | ICD-10-CM

## 2018-04-04 DIAGNOSIS — Z7901 Long term (current) use of anticoagulants: Secondary | ICD-10-CM

## 2018-04-05 ENCOUNTER — Ambulatory Visit (HOSPITAL_COMMUNITY): Payer: Self-pay | Admitting: Pharmacist

## 2018-04-05 ENCOUNTER — Ambulatory Visit (HOSPITAL_COMMUNITY)
Admission: RE | Admit: 2018-04-05 | Discharge: 2018-04-05 | Disposition: A | Discharge status: Home / Self Care | Payer: Medicare Other | Source: Ambulatory Visit | Attending: Cardiology | Admitting: Cardiology

## 2018-04-05 DIAGNOSIS — Z7901 Long term (current) use of anticoagulants: Secondary | ICD-10-CM | POA: Diagnosis not present

## 2018-04-05 DIAGNOSIS — Z95811 Presence of heart assist device: Secondary | ICD-10-CM | POA: Diagnosis not present

## 2018-04-05 LAB — LACTATE DEHYDROGENASE: LDH: 204 U/L — ABNORMAL HIGH (ref 98–192)

## 2018-04-05 LAB — BASIC METABOLIC PANEL
Anion gap: 9 (ref 5–15)
BUN: 23 mg/dL (ref 8–23)
CO2: 26 mmol/L (ref 22–32)
Calcium: 9.5 mg/dL (ref 8.9–10.3)
Chloride: 102 mmol/L (ref 98–111)
Creatinine, Ser: 1.61 mg/dL — ABNORMAL HIGH (ref 0.61–1.24)
GFR calc Af Amer: 47 mL/min — ABNORMAL LOW (ref >60)
GFR calc non Af Amer: 40 mL/min — ABNORMAL LOW (ref >60)
Glucose, Bld: 144 mg/dL — ABNORMAL HIGH (ref 70–99)
Potassium: 4.6 mmol/L (ref 3.5–5.1)
Sodium: 137 mmol/L (ref 135–145)

## 2018-04-05 LAB — CBC
HCT: 47.7 % (ref 39.0–52.0)
Hemoglobin: 15.3 g/dL (ref 13.0–17.0)
MCH: 29.9 pg (ref 26.0–34.0)
MCHC: 32.1 g/dL (ref 30.0–36.0)
MCV: 93.2 fL (ref 78.0–100.0)
Platelets: 170 10*3/uL (ref 150–400)
RBC: 5.12 MIL/uL (ref 4.22–5.81)
RDW: 15.1 % (ref 11.5–15.5)
WBC: 5.6 10*3/uL (ref 4.0–10.5)

## 2018-04-05 LAB — PROTIME-INR
INR: 2.93
Prothrombin Time: 30.3 seconds — ABNORMAL HIGH (ref 11.4–15.2)

## 2018-04-15 ENCOUNTER — Other Ambulatory Visit (HOSPITAL_COMMUNITY): Payer: Self-pay | Admitting: Unknown Physician Specialty

## 2018-04-15 DIAGNOSIS — Z7901 Long term (current) use of anticoagulants: Secondary | ICD-10-CM

## 2018-04-15 DIAGNOSIS — Z95811 Presence of heart assist device: Secondary | ICD-10-CM

## 2018-04-19 ENCOUNTER — Ambulatory Visit (HOSPITAL_COMMUNITY): Payer: Self-pay | Admitting: Pharmacist

## 2018-04-19 ENCOUNTER — Ambulatory Visit (HOSPITAL_COMMUNITY)
Admission: RE | Admit: 2018-04-19 | Discharge: 2018-04-19 | Disposition: A | Discharge status: Home / Self Care | Payer: Medicare Other | Source: Ambulatory Visit | Attending: Internal Medicine | Admitting: Internal Medicine

## 2018-04-19 DIAGNOSIS — Z95811 Presence of heart assist device: Secondary | ICD-10-CM | POA: Diagnosis not present

## 2018-04-19 DIAGNOSIS — Z7901 Long term (current) use of anticoagulants: Secondary | ICD-10-CM

## 2018-04-19 LAB — PROTIME-INR
INR: 2.65
Prothrombin Time: 28 seconds — ABNORMAL HIGH (ref 11.4–15.2)

## 2018-05-14 ENCOUNTER — Other Ambulatory Visit (HOSPITAL_COMMUNITY): Payer: Self-pay | Admitting: *Deleted

## 2018-05-14 DIAGNOSIS — Z7901 Long term (current) use of anticoagulants: Secondary | ICD-10-CM

## 2018-05-14 DIAGNOSIS — Z95811 Presence of heart assist device: Secondary | ICD-10-CM

## 2018-05-16 ENCOUNTER — Encounter (HOSPITAL_COMMUNITY): Payer: Medicare Other

## 2018-05-17 ENCOUNTER — Encounter (HOSPITAL_COMMUNITY): Payer: Self-pay

## 2018-05-17 ENCOUNTER — Ambulatory Visit (HOSPITAL_COMMUNITY): Payer: Self-pay | Admitting: Pharmacist

## 2018-05-17 ENCOUNTER — Ambulatory Visit (HOSPITAL_COMMUNITY)
Admission: RE | Admit: 2018-05-17 | Discharge: 2018-05-17 | Disposition: A | Discharge status: Home / Self Care | Payer: Medicare Other | Source: Ambulatory Visit | Attending: Internal Medicine | Admitting: Internal Medicine

## 2018-05-17 VITALS — BP 100/80 | HR 67 | Ht 69.0 in | Wt 167.0 lb

## 2018-05-17 DIAGNOSIS — I252 Old myocardial infarction: Secondary | ICD-10-CM | POA: Diagnosis not present

## 2018-05-17 DIAGNOSIS — Z79899 Other long term (current) drug therapy: Secondary | ICD-10-CM | POA: Diagnosis not present

## 2018-05-17 DIAGNOSIS — Z95810 Presence of automatic (implantable) cardiac defibrillator: Secondary | ICD-10-CM | POA: Diagnosis not present

## 2018-05-17 DIAGNOSIS — I2510 Atherosclerotic heart disease of native coronary artery without angina pectoris: Secondary | ICD-10-CM | POA: Diagnosis not present

## 2018-05-17 DIAGNOSIS — Z955 Presence of coronary angioplasty implant and graft: Secondary | ICD-10-CM | POA: Insufficient documentation

## 2018-05-17 DIAGNOSIS — I5022 Chronic systolic (congestive) heart failure: Secondary | ICD-10-CM | POA: Insufficient documentation

## 2018-05-17 DIAGNOSIS — I10 Essential (primary) hypertension: Secondary | ICD-10-CM

## 2018-05-17 DIAGNOSIS — G479 Sleep disorder, unspecified: Secondary | ICD-10-CM | POA: Diagnosis not present

## 2018-05-17 DIAGNOSIS — E039 Hypothyroidism, unspecified: Secondary | ICD-10-CM | POA: Insufficient documentation

## 2018-05-17 DIAGNOSIS — I472 Ventricular tachycardia: Secondary | ICD-10-CM | POA: Diagnosis not present

## 2018-05-17 DIAGNOSIS — I4729 Other ventricular tachycardia: Secondary | ICD-10-CM

## 2018-05-17 DIAGNOSIS — E785 Hyperlipidemia, unspecified: Secondary | ICD-10-CM | POA: Diagnosis not present

## 2018-05-17 DIAGNOSIS — Z79890 Hormone replacement therapy (postmenopausal): Secondary | ICD-10-CM | POA: Insufficient documentation

## 2018-05-17 DIAGNOSIS — Z7982 Long term (current) use of aspirin: Secondary | ICD-10-CM | POA: Diagnosis not present

## 2018-05-17 DIAGNOSIS — Z95811 Presence of heart assist device: Secondary | ICD-10-CM | POA: Insufficient documentation

## 2018-05-17 DIAGNOSIS — Z7901 Long term (current) use of anticoagulants: Secondary | ICD-10-CM | POA: Diagnosis not present

## 2018-05-17 DIAGNOSIS — I159 Secondary hypertension, unspecified: Secondary | ICD-10-CM | POA: Diagnosis not present

## 2018-05-17 DIAGNOSIS — I110 Hypertensive heart disease with heart failure: Secondary | ICD-10-CM | POA: Insufficient documentation

## 2018-05-17 LAB — BASIC METABOLIC PANEL
Anion gap: 10 (ref 5–15)
BUN: 24 mg/dL — ABNORMAL HIGH (ref 8–23)
CO2: 24 mmol/L (ref 22–32)
Calcium: 9 mg/dL (ref 8.9–10.3)
Chloride: 103 mmol/L (ref 98–111)
Creatinine, Ser: 1.47 mg/dL — ABNORMAL HIGH (ref 0.61–1.24)
GFR calc Af Amer: 52 mL/min — ABNORMAL LOW (ref >60)
GFR calc non Af Amer: 45 mL/min — ABNORMAL LOW (ref >60)
Glucose, Bld: 93 mg/dL (ref 70–99)
Potassium: 4.4 mmol/L (ref 3.5–5.1)
Sodium: 137 mmol/L (ref 135–145)

## 2018-05-17 LAB — CBC
HCT: 45.1 % (ref 39.0–52.0)
Hemoglobin: 14.4 g/dL (ref 13.0–17.0)
MCH: 29.8 pg (ref 26.0–34.0)
MCHC: 31.9 g/dL (ref 30.0–36.0)
MCV: 93.2 fL (ref 78.0–100.0)
Platelets: 146 10*3/uL — ABNORMAL LOW (ref 150–400)
RBC: 4.84 MIL/uL (ref 4.22–5.81)
RDW: 14.8 % (ref 11.5–15.5)
WBC: 4.7 10*3/uL (ref 4.0–10.5)

## 2018-05-17 LAB — PROTIME-INR
INR: 2.71
Prothrombin Time: 28.5 seconds — ABNORMAL HIGH (ref 11.4–15.2)

## 2018-05-17 LAB — LACTATE DEHYDROGENASE: LDH: 201 U/L — ABNORMAL HIGH (ref 98–192)

## 2018-05-17 MED ORDER — TEMAZEPAM 15 MG PO CAPS: ORAL_CAPSULE | ORAL | 5 refills | Status: DC

## 2018-05-17 NOTE — Progress Notes (Signed)
VAD CLINIC NOTE   HPI:  Johnny Oneill is a 77 y.o. 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   Admitted 3/4-3/25/15 with SOB and syncopal episode. Found to have SBO and NG placed and started on TPN. On 11/06/13 developed progressive SOB and hypotension and co-ox 51% and Hgb 6.7. Started on milrinone and transfused. Echo showed nl RV function. Patient had PEA arrest and was intubated and started on pressors. CXR revealed a R hemothorax and a R CT placed. He required VATs and evacuation of hemothorax and was then teansferred to CIR.   LVAD Issues  Elevated LDH--bivalirudin. D/C LDH 188  He presents today for regular follow up. Remains very active. Denies lightheadedness, dizziness, CP, palpitations, fevers, chills, DOE, orthopnea, PND, or any SOB. He denies problems with his driveline, fevers, or chills. He is taking all medication as directed. He walks 3 miles every Thursday and states he is "short of breath" after, but like he got a good workout.   Echo 3/18 EF 10-15%. RV moderate HK. LVAD cannula ok. Mild AI  Echo 9/16 EF 20%, LVAD cannula ok moderate RV dilation with mild to moderately decreased RV systolic function.   VAD Indication: Destination therapy- patient choice    VAD interrogation & Equipment Management: Speed: 9200 Flow: 4.4 Power: 5.2 w PI: 6.1  Alarms: None.  Events: 5-10 PI events.   Fixed speed 9200 Low speed limit: 8600  Past Medical History:  Diagnosis Date  . Anxiety   . Automatic implantable cardioverter-defibrillator in situ   . CAD (coronary artery disease)    S/P stenting of LCX in 2004;  s/p Lat MI 2009 with occlusion of the LCX - treated with Promus stenting. Has total occlusion of the RCA.   [name] CHF (congestive heart failure) ([place])   . Hypercholesteremia   . Hypertension   . Hypothyroidism   . ICD (implantable cardiac defibrillator) in place    PROPHYLACTIC       medtronic  . Inferior MI ([place]) "? date"; 2009  . Ischemic cardiomyopathy    a. 10/09 Echo: Sev LV Dysfxn, inf/lat AK, Mod MR.  [name] LVAD (left ventricular assist device) present ([place])   . Osteoarthritis    a. s/p R TKR  . Pacemaker   . PVC (premature ventricular contraction)   . RBBB   . Shortness of breath     Current Outpatient Medications  Medication Sig Dispense Refill  . acetaminophen (TYLENOL) 500 MG chewable tablet Chew 500 mg by mouth every 6 (six) hours as needed for pain.    [name] aspirin EC 325 MG tablet Take 325 mg by mouth daily.    . citalopram (CELEXA) 10 MG tablet Take 1 tablet (10 mg total) by mouth daily. 90 tablet 3  . Coenzyme Q10 (COQ10) 200 MG CAPS Take 200 mg by mouth daily.    . hydrALAZINE (APRESOLINE) 50 MG tablet TAKE ONE TABLET BY MOUTH THREE TIMES DAILY 270 tablet 3  . levothyroxine (SYNTHROID, LEVOTHROID) 125 MCG tablet 1 tablet daily and 1/2 tablet on Sundays and Wednesdays    . levothyroxine (SYNTHROID, LEVOTHROID) 125 MCG tablet TAKE 1 TABLET BY MOUTH ONCE DAILY 90 tablet 3  . Melatonin 3 MG TABS Take 1 tablet by mouth at bedtime.    . pantoprazole (PROTONIX) 40 MG tablet TAKE 1 TABLET BY MOUTH ONCE DAILY 90 tablet 1  . potassium chloride SA (K-DUR,KLOR-CON)  20 MEQ tablet Take 1 tablet (20 mEq total) by mouth daily as needed. Take one potassium pill when you take your lasix. 90 tablet 3  . Probiotic Product (PROBIOTIC PO) Take 1 capsule by mouth daily.    . ranitidine (ZANTAC) 150 MG tablet Take 150 mg by mouth daily as needed for heartburn.    . rosuvastatin (CRESTOR) 20 MG tablet Take 1 tablet (20 mg total) by mouth daily. 90 tablet 3  . spironolactone (ALDACTONE) 25 MG tablet Take 1 tablet (25 mg total) by mouth daily. Take in the evening 30 tablet 3  . temazepam (RESTORIL) 15 MG capsule TAKE ONE CAPSULE BY MOUTH ONCE DAILY AT BEDTIME 30 capsule 5  . warfarin (COUMADIN) 5 MG tablet Take 2 tablets (10 mg) daily except 1 tablet (5 mg) on Monday/Friday      . furosemide (LASIX) 20 MG tablet Take 20 mg by mouth daily as needed (Take 20 mg as daily as needed for wt > 153 lbs). Reported on 01/14/2016     No current facility-administered medications for this encounter.    Facility-Administered Medications Ordered in Other Encounters  Medication Dose Route Frequency Provider Last Rate Last Dose  . etomidate (AMIDATE) injection    Anesthesia Intra-op Myna Bright, CRNA   20 mg at 02/08/14 0915  . rocuronium [facility]) injection    Anesthesia Intra-op Myna Bright, CRNA   50 mg at 02/08/14 0932  . succinylcholine (ANECTINE) injection    Anesthesia Intra-op Myna Bright, CRNA   100 mg at 02/08/14 0915   Ace inhibitors; Diovan [valsartan]; Lipitor [atorvastatin calcium]; and Norvasc [amlodipine]  Review of systems complete and found to be negative unless listed in HPI.    Vital Signs:  Doppler Pressure: 92 Automatc BP:  100/80 (86) HR:  67 SPO2: 97% on RA  Weight: 167 slb w/o eqt Last weight: 164.6 lb   Vital signs: Vitals:   05/17/18 1241 05/17/18 1242  BP: (!) 92/0 100/80  Pulse:  67  SpO2:  97%  Weight:  75.8 kg (167 lb)  Height:  5\' 9"  (1.753 m)   General: Well appearing this am. NAD.  HEENT: Normal. Neck: Supple, JVP 7-8 cm. Carotids OK.  Cardiac:  Mechanical heart sounds with LVAD hum present.  Lungs:  CTAB, normal effort.  Abdomen:  NT, ND, no HSM. No bruits or masses. +BS  LVAD exit site: Well-healed and incorporated. Dressing dry and intact. No erythema or drainage. Stabilization device present and accurately applied. Driveline dressing changed daily per sterile technique. Extremities:  Warm and dry. No cyanosis, clubbing, rash, or edema.  Neuro:  Alert & oriented x 3. Cranial nerves grossly intact. Moves all 4 extremities w/o difficulty. Affect pleasant     ASSESSMENT AND PLAN:  1) Chronic systolic HF: ICM, EF 70-26% s/p LVAD HM II implant 08/2013 for DT.   - Echo 3/18 EF 10-15% mild AI. Mod RV  dysfunction. LVAD cannula OK - NYHA I symptoms - Volume status stable on exam.  - Continue spiro. Using lasix only prn.  - He has not tolerated ACE-I or amlodipine in the past. - ICD at [name]. Has seen Dr. Rayann Heman in 2/18 and decided not to replace.   2) LVAD: HMII 08/2013 for DT. - VAD interrogated personally. Parameters stable.   - Admitted in May 2018 with elevated LDH with bival.  - LDH 201. No power spikes.  - Continue aspirin and coumadin.  - Hgb stable 14.4 3) Chronic anticoagulation: INR goal  2.0-3.0.    - INR 2.71 today. Dosing per Pharm D. No issues.  - Continue ASA 325 mg for LVAD + warfarin.  4) HTN:  - MAPs well controlled.  5) Hypothyroidism:  - Followed by Dr Dwyane Dee. Stable.  6) Hyperlipidemia:  - Continue Crestor. Zetia stopped due to cost.  - No change.  7) CAD - No s/s of ischemia.     Labs stable today. Keep 2 month follow up.   Satira Mccallum Tillery, PA-C  1:40 PM   Greater than 50% of the 50 minute visit was spent in counseling/coordination of care regarding disease state education, salt/fluid restriction, sliding scale diuretics, and medication compliance.

## 2018-05-17 NOTE — Progress Notes (Signed)
Patient presents for 2 month follow up in [facility] today. Reports no problems with VAD equipment or concerns with drive line.   Pt continues to walk 3 miles weekly at VAD walking club with some SOB at end of walk. He denies any limitations with his daily activities.   Vital Signs:  Doppler Pressure:  92 Automatc BP:  100/80 (86) HR:  67 SPO2: 97% on RA  Weight: 167 lb w/o eqt Last weight: 164.6 lb  VAD Indication: Destination therapy- patient choice    VAD interrogation & Equipment Management: Speed: 9200 Flow:  4.4 Power:5.2 w    PI:  6.1  Alarms: none Events: 5 - 10 PI events daily  Fixed speed 9200  Low speed limit: 8600  Primary Controller:  Replace back up battery in 28 months Back up controller:  Replace back up battery in 26 months  Annual Equipment Maintenance on UBC/PM was performed on 08/2017  I reviewed the LVAD parameters from today and compared the results to the patient's prior recorded data. LVAD interrogation was NEGATIVE for significant power changes, NEGATIVE for clinical alarms and STABLE for PI events/speed drops. No programming changes were made and pump is functioning within specified parameters. Pt is performing daily controller and system monitor self tests along with completing weekly and monthly maintenance for LVAD equipment.  Exit Site Care: Drive line is being maintained every twice weekly using gauze dressing during heat of summer by [name], his wife. Gauze dressing dry and intact. Stabilization device present and accurately applied. Pt denies fever or chills. Pt has CHG allergy. Pt provided with 8 daily dressing kits and 5 extra anchors.  Significant Events on VAD Support:  10/2013>SBO 12/2016> elevated LDH, admit for Bival  Device:Medtronic ERI Therapies: off Last check:  09/23/17  BP & Labs:  Doppler BP 92 - Doppler is reflecting modified systolic  Hgb 10.2 - No S/S of bleeding. Specifically denies melena/BRBPR or  nosebleeds.  LDH 201 and is within established baseline of 200- 250. Denies tea-colored urine. No power elevations noted on interrogation.    Patient Instructions:  1. No change in medications. 3.  Return to clinic in 2 months  [place] Coordinator   Office: [phone] 24/7 Emergency [place] Pager: [phone]

## 2018-05-30 ENCOUNTER — Other Ambulatory Visit (INDEPENDENT_AMBULATORY_CARE_PROVIDER_SITE_OTHER): Payer: Medicare Other

## 2018-05-30 DIAGNOSIS — E890 Postprocedural hypothyroidism: Secondary | ICD-10-CM

## 2018-05-30 LAB — TSH: TSH: 0.94 u[IU]/mL (ref 0.35–4.50)

## 2018-05-30 LAB — T4, FREE: Free T4: 1.18 ng/dL (ref 0.60–1.60)

## 2018-06-02 ENCOUNTER — Telehealth (HOSPITAL_COMMUNITY): Payer: Self-pay | Admitting: *Deleted

## 2018-06-02 ENCOUNTER — Ambulatory Visit (INDEPENDENT_AMBULATORY_CARE_PROVIDER_SITE_OTHER): Payer: Medicare Other | Admitting: Endocrinology

## 2018-06-02 ENCOUNTER — Encounter: Payer: Self-pay | Admitting: Endocrinology

## 2018-06-02 VITALS — BP 102/78 | HR 76 | Ht 67.0 in | Wt 168.0 lb

## 2018-06-02 DIAGNOSIS — T827XXA Infection and inflammatory reaction due to other cardiac and vascular devices, implants and grafts, initial encounter: Secondary | ICD-10-CM

## 2018-06-02 DIAGNOSIS — I255 Ischemic cardiomyopathy: Secondary | ICD-10-CM

## 2018-06-02 DIAGNOSIS — E890 Postprocedural hypothyroidism: Secondary | ICD-10-CM

## 2018-06-02 MED ORDER — LEVOTHYROXINE SODIUM 137 MCG PO TABS: 137.0000 ug | ORAL_TABLET | Freq: Every day | ORAL | 2 refills | Status: DC

## 2018-06-02 MED ORDER — DOXYCYCLINE HYCLATE 50 MG PO CAPS: 100.0000 mg | ORAL_CAPSULE | Freq: Two times a day (BID) | ORAL | 0 refills | Status: DC

## 2018-06-02 NOTE — Progress Notes (Signed)
Patient ID: Johnny Oneill, male   DOB: 07-Mar-1941, 77 y.o.   MRN: [id number]   Reason for Appointment:  Hypothyroidism, followup visit    History of Present Illness:   The hypothyroidism was first diagnosed after radioactive iodine treatment for [name] several years ago  Previously he had lost significant amount of weight with his worsening heart failure and subsequently this stabilized More recently his weight has improved somewhat and is stable.  Previously was taking 200 mcg daily since 3/15 when he was hospitalized  Subsequently he was on steady dose of 175 mcg since 11/2013 Subsequently the dose was reduced down to 150 g in 2016   Current regimen: he has been taking 125 g, 8 tablets weekly He takes an extra half tablet on Wednesday and Saturday  He does not feel weak or tired He is involved with certain activities including refereeing and appears to be able to be reasonably active despite his cardiac condition  No cold intolerance or dry skin  He gets his prescription from [facility] consistently, he thinks he is getting Sandoz brand currently  He is very regular with taking the tablet in the morning before breakfast.  Does not take any vitamins at the same time  His TSH is again normal and slightly lower Free T4 minimally higher than before   Wt Readings from Last 3 Encounters:  06/02/18 168 lb (76.2 kg)  05/17/18 167 lb (75.8 kg)  03/21/18 164 lb 9.6 oz (74.7 kg)    Lab Results  Component Value Date   TSH 0.94 05/30/2018   TSH 2.69 11/29/2017   TSH 2.44 08/06/2017   FREET4 1.18 05/30/2018   FREET4 0.96 11/29/2017   FREET4 1.16 08/06/2017      Allergies as of 06/02/2018      Reactions   Ace Inhibitors    Hypotension and elevated creatinine   Diovan [valsartan] Other (See Comments)   Hypotension at low dose, hyperkalemia   Lipitor [atorvastatin Calcium] Other (See Comments)   Muscle pain   Norvasc [amlodipine] Other (See Comments)     Put patient to sleep      Medication List        Accurate as of 06/02/18  2:59 PM. Always use your most recent med list.          acetaminophen 500 MG chewable tablet Commonly known as:  TYLENOL Chew 500 mg by mouth every 6 (six) hours as needed for pain.   aspirin EC 325 MG tablet Take 325 mg by mouth daily.   citalopram 10 MG tablet Commonly known as:  CELEXA Take 1 tablet (10 mg total) by mouth daily.   CoQ10 200 MG Caps Take 200 mg by mouth daily.   doxycycline 50 MG capsule Commonly known as:  VIBRAMYCIN Take 2 capsules (100 mg total) by mouth 2 (two) times daily.   furosemide 20 MG tablet Commonly known as:  LASIX Take 20 mg by mouth daily as needed (Take 20 mg as daily as needed for wt > 153 lbs). Reported on 01/14/2016   hydrALAZINE 50 MG tablet Commonly known as:  APRESOLINE TAKE ONE TABLET BY MOUTH THREE TIMES DAILY   levothyroxine 125 MCG tablet Commonly known as:  SYNTHROID, LEVOTHROID 1 tablet daily and 1/2 tablet on Sundays and Wednesdays   Melatonin 3 MG Tabs Take 1 tablet by mouth at bedtime.   pantoprazole 40 MG tablet Commonly known as:  PROTONIX TAKE 1 TABLET BY MOUTH ONCE DAILY  potassium chloride SA 20 MEQ tablet Commonly known as:  K-DUR,KLOR-CON Take 1 tablet (20 mEq total) by mouth daily as needed. Take one potassium pill when you take your lasix.   PROBIOTIC PO Take 1 capsule by mouth daily.   ranitidine 150 MG tablet Commonly known as:  ZANTAC Take 150 mg by mouth daily as needed for heartburn.   rosuvastatin 20 MG tablet Commonly known as:  CRESTOR Take 1 tablet (20 mg total) by mouth daily.   spironolactone 25 MG tablet Commonly known as:  ALDACTONE Take 1 tablet (25 mg total) by mouth daily. Take in the evening   temazepam 15 MG capsule Commonly known as:  RESTORIL TAKE ONE CAPSULE BY MOUTH ONCE DAILY AT BEDTIME   warfarin 5 MG tablet Commonly known as:  COUMADIN Take as directed by the anticoagulation clinic. If  you are unsure how to take this medication, talk to your nurse or doctor. Original instructions:  Take 2 tablets (10 mg) daily except 1 tablet (5 mg) on Monday/Friday        Past Medical History:  Diagnosis Date  . Anxiety   . Automatic implantable cardioverter-defibrillator in situ   . CAD (coronary artery disease)    S/P stenting of LCX in 2004;  s/p Lat MI 2009 with occlusion of the LCX - treated with Promus stenting. Has total occlusion of the RCA.   [name] CHF (congestive heart failure) ([place])   . Hypercholesteremia   . Hypertension   . Hypothyroidism   . ICD (implantable cardiac defibrillator) in place    PROPHYLACTIC      medtronic  . Inferior MI ([place]) "? date"; 2009  . Ischemic cardiomyopathy    a. 10/09 Echo: Sev LV Dysfxn, inf/lat AK, Mod MR.  [name] LVAD (left ventricular assist device) present ([place])   . Osteoarthritis    a. s/p R TKR  . Pacemaker   . PVC (premature ventricular contraction)   . RBBB   . Shortness of breath     Past Surgical History:  Procedure Laterality Date  . CARPAL TUNNEL RELEASE Right 05/29/2016   Procedure: CARPAL TUNNEL RELEASE;  Surgeon: Earlie Server, MD;  Location: [facility];  Service: Orthopedics;  Laterality: Right;  . CORONARY ANGIOPLASTY WITH STENT PLACEMENT  2004  . CORONARY ANGIOPLASTY WITH STENT PLACEMENT  07/2008  . DEFIBRILLATOR  2009  . ESOPHAGOGASTRODUODENOSCOPY N/A 09/15/2013   Procedure: ESOPHAGOGASTRODUODENOSCOPY (EGD);  Surgeon: Ladene Artist, MD;  Location: [facility] ENDOSCOPY;  Service: Endoscopy;  Laterality: N/A;  bedside  . HEMATOMA EVACUATION Right 11/06/2013   Procedure: EVACUATION HEMATOMA;  Surgeon: Gaye Pollack, MD;  Location: [facility];  Service: Cardiothoracic;  Laterality: Right;  . INSERT / REPLACE / REMOVE PACEMAKER  2009  . INSERTION OF IMPLANTABLE LEFT VENTRICULAR ASSIST DEVICE N/A 09/18/2013   Procedure: INSERTION OF IMPLANTABLE LEFT VENTRICULAR ASSIST DEVICE;  Surgeon: Ivin Poot, MD;  Location: [facility];  Service: Open Heart  Surgery;  Laterality: N/A;  CIRC ARREST  NITRIC OXIDE  . INTRA-AORTIC BALLOON PUMP INSERTION N/A 09/14/2013   Procedure: INTRA-AORTIC BALLOON PUMP INSERTION;  Surgeon: Jolaine Artist, MD;  Location: [facility] CATH LAB;  Service: Cardiovascular;  Laterality: N/A;  . INTRAOPERATIVE TRANSESOPHAGEAL ECHOCARDIOGRAM N/A 09/18/2013   Procedure: INTRAOPERATIVE TRANSESOPHAGEAL ECHOCARDIOGRAM;  Surgeon: Ivin Poot, MD;  Location: [facility];  Service: Open Heart Surgery;  Laterality: N/A;  . KNEE ARTHROSCOPY     bilaterally  . KNEE ARTHROSCOPY W/ PARTIAL MEDIAL MENISCECTOMY  12/2002   left  .  LEFT VENTRICULAR ASSIST DEVICE    . RIGHT HEART CATHETERIZATION N/A 08/25/2013   Procedure: RIGHT HEART CATH;  Surgeon: Jolaine Artist, MD;  Location: [facility] CATH LAB;  Service: Cardiovascular;  Laterality: N/A;  . RIGHT HEART CATHETERIZATION N/A 09/13/2013   Procedure: RIGHT HEART CATH;  Surgeon: Jolaine Artist, MD;  Location: [facility] CATH LAB;  Service: Cardiovascular;  Laterality: N/A;  . RIGHT HEART CATHETERIZATION N/A 02/08/2014   Procedure: RIGHT HEART CATH;  Surgeon: Jolaine Artist, MD;  Location: [facility] CATH LAB;  Service: Cardiovascular;  Laterality: N/A;  . RIGHT HEART CATHETERIZATION N/A 02/12/2014   Procedure: RIGHT HEART CATH;  Surgeon: Jolaine Artist, MD;  Location: [facility] CATH LAB;  Service: Cardiovascular;  Laterality: N/A;  . TOTAL KNEE ARTHROPLASTY  11/27/11   left  . TOTAL KNEE ARTHROPLASTY  11/27/2011   Procedure: TOTAL KNEE ARTHROPLASTY;  Surgeon: Yvette Rack., MD;  Location: [facility];  Service: Orthopedics;  Laterality: Left;  left total knee arthroplasty  . VIDEO ASSISTED THORACOSCOPY Right 11/06/2013   Procedure: VIDEO ASSISTED THORACOSCOPY;  Surgeon: Gaye Pollack, MD;  Location: [facility] OR;  Service: Cardiothoracic;  Laterality: Right;    Family History  Problem Relation Age of Onset  . Heart failure Father   . Stroke Father   . Coronary artery disease Mother   . Heart disease Mother   .  Hyperlipidemia Sister     Social History:  reports that he has never smoked. He has never used smokeless tobacco. He reports that he does not drink alcohol or use drugs.  Allergies:  Allergies  Allergen Reactions  . Ace Inhibitors     Hypotension and elevated creatinine  . Diovan [Valsartan] Other (See Comments)    Hypotension at low dose, hyperkalemia  . Lipitor [Atorvastatin Calcium] Other (See Comments)    Muscle pain  . Norvasc [Amlodipine] Other (See Comments)    Put patient to sleep   ROS:  Cardiomyopathy: He has been using a ventricle assist device and he is on anticoagulation  No recent weight change  Wt Readings from Last 3 Encounters:  06/02/18 168 lb (76.2 kg)  05/17/18 167 lb (75.8 kg)  03/21/18 164 lb 9.6 oz (74.7 kg)      Examination:   BP 102/78   Pulse 76   Ht 5\' 7"  (1.702 m)   Wt 168 lb (76.2 kg)   SpO2 97%   BMI 26.31 kg/m   Exam not indicated    Assessment/Plan:   Post ablative Hypothyroidism:  He is on a consistent dose of levothyroxine 125 mcg, 8 tablets/week which is equivalent to about 142 mcg daily Weight is about the same However his TSH is trending downwards and now below 1.0  Because of his cardiac history will reduce the dose slightly and give him the 137 mcg pill now He is consistent with his supplement every morning and will keep him on generic, currently getting Sandoz brand Follow-up in 6 months   Patient Instructions  Do not refill 125ug        Elayne Snare 06/02/2018, 2:59 PM

## 2018-06-02 NOTE — Telephone Encounter (Signed)
Wife called to report pt's controller "hit" his drive line site yesterday. With dressing change today, she noted redness at site, pt says it hurt at the time of accident, but denies pain, tenderness, chills, or fever. Wife said he had scant amount brown drainage. She is currently using daily dressing kits and changing every 3 days due to hot weather. She will increase frequency if necessary to keep dressing dry and intact.    Reviewed with Darrick Grinder, NP. Rx for Doxycycline 100 mg bid for 7 days sent. Called [name] re: antibiotic course.  Zada Girt RN, [place] Coordinator [phone]

## 2018-06-02 NOTE — Patient Instructions (Addendum)
Do not refill 125ug

## 2018-06-10 ENCOUNTER — Other Ambulatory Visit (HOSPITAL_COMMUNITY): Payer: Self-pay | Admitting: *Deleted

## 2018-06-10 DIAGNOSIS — Z7901 Long term (current) use of anticoagulants: Secondary | ICD-10-CM

## 2018-06-10 DIAGNOSIS — Z95811 Presence of heart assist device: Secondary | ICD-10-CM

## 2018-06-14 ENCOUNTER — Ambulatory Visit (HOSPITAL_COMMUNITY): Payer: Self-pay | Admitting: Pharmacist

## 2018-06-14 ENCOUNTER — Ambulatory Visit (HOSPITAL_COMMUNITY)
Admission: RE | Admit: 2018-06-14 | Discharge: 2018-06-14 | Disposition: A | Discharge status: Home / Self Care | Payer: Medicare Other | Source: Ambulatory Visit | Attending: Cardiology | Admitting: Cardiology

## 2018-06-14 DIAGNOSIS — Z95811 Presence of heart assist device: Secondary | ICD-10-CM | POA: Diagnosis not present

## 2018-06-14 DIAGNOSIS — Z7901 Long term (current) use of anticoagulants: Secondary | ICD-10-CM

## 2018-06-14 LAB — PROTIME-INR
INR: 2.79
Prothrombin Time: 29 seconds — ABNORMAL HIGH (ref 11.4–15.2)

## 2018-06-14 LAB — LACTATE DEHYDROGENASE: LDH: 244 U/L — ABNORMAL HIGH (ref 98–192)

## 2018-06-20 DIAGNOSIS — M25561 Pain in right knee: Secondary | ICD-10-CM | POA: Diagnosis not present

## 2018-06-24 ENCOUNTER — Other Ambulatory Visit (HOSPITAL_COMMUNITY): Payer: Self-pay | Admitting: Unknown Physician Specialty

## 2018-06-24 DIAGNOSIS — Z95811 Presence of heart assist device: Secondary | ICD-10-CM

## 2018-06-24 DIAGNOSIS — Z7901 Long term (current) use of anticoagulants: Secondary | ICD-10-CM

## 2018-06-28 ENCOUNTER — Ambulatory Visit (HOSPITAL_COMMUNITY)
Admission: RE | Admit: 2018-06-28 | Discharge: 2018-06-28 | Disposition: A | Discharge status: Home / Self Care | Payer: Medicare Other | Source: Ambulatory Visit | Attending: Cardiology | Admitting: Cardiology

## 2018-06-28 ENCOUNTER — Ambulatory Visit (HOSPITAL_COMMUNITY): Payer: Self-pay | Admitting: Pharmacist

## 2018-06-28 DIAGNOSIS — Z95811 Presence of heart assist device: Secondary | ICD-10-CM | POA: Diagnosis not present

## 2018-06-28 DIAGNOSIS — Z7901 Long term (current) use of anticoagulants: Secondary | ICD-10-CM | POA: Insufficient documentation

## 2018-06-28 LAB — PROTIME-INR
INR: 2.73
Prothrombin Time: 28.5 seconds — ABNORMAL HIGH (ref 11.4–15.2)

## 2018-06-28 LAB — LACTATE DEHYDROGENASE: LDH: 300 U/L — ABNORMAL HIGH (ref 98–192)

## 2018-06-29 ENCOUNTER — Other Ambulatory Visit (HOSPITAL_COMMUNITY): Payer: Self-pay

## 2018-07-01 ENCOUNTER — Other Ambulatory Visit (HOSPITAL_COMMUNITY): Payer: Self-pay | Admitting: Pharmacist

## 2018-07-01 MED ORDER — TRAZODONE HCL 50 MG PO TABS: 50.0000 mg | ORAL_TABLET | Freq: Every day | ORAL | 5 refills | Status: DC

## 2018-07-18 ENCOUNTER — Other Ambulatory Visit (HOSPITAL_COMMUNITY): Payer: Self-pay | Admitting: *Deleted

## 2018-07-18 DIAGNOSIS — Z95811 Presence of heart assist device: Secondary | ICD-10-CM

## 2018-07-18 DIAGNOSIS — Z7901 Long term (current) use of anticoagulants: Secondary | ICD-10-CM

## 2018-07-19 ENCOUNTER — Ambulatory Visit (HOSPITAL_COMMUNITY)
Admission: RE | Admit: 2018-07-19 | Discharge: 2018-07-19 | Disposition: A | Discharge status: Home / Self Care | Payer: Medicare Other | Source: Ambulatory Visit | Attending: Cardiology | Admitting: Cardiology

## 2018-07-19 ENCOUNTER — Encounter (HOSPITAL_COMMUNITY): Payer: Self-pay

## 2018-07-19 ENCOUNTER — Ambulatory Visit (HOSPITAL_COMMUNITY): Payer: Self-pay | Admitting: Pharmacist

## 2018-07-19 VITALS — BP 100/72 | HR 81 | Wt 170.8 lb

## 2018-07-19 DIAGNOSIS — E039 Hypothyroidism, unspecified: Secondary | ICD-10-CM | POA: Diagnosis not present

## 2018-07-19 DIAGNOSIS — I2510 Atherosclerotic heart disease of native coronary artery without angina pectoris: Secondary | ICD-10-CM | POA: Diagnosis not present

## 2018-07-19 DIAGNOSIS — I255 Ischemic cardiomyopathy: Secondary | ICD-10-CM | POA: Insufficient documentation

## 2018-07-19 DIAGNOSIS — F419 Anxiety disorder, unspecified: Secondary | ICD-10-CM | POA: Insufficient documentation

## 2018-07-19 DIAGNOSIS — I159 Secondary hypertension, unspecified: Secondary | ICD-10-CM | POA: Diagnosis not present

## 2018-07-19 DIAGNOSIS — I5022 Chronic systolic (congestive) heart failure: Secondary | ICD-10-CM | POA: Diagnosis not present

## 2018-07-19 DIAGNOSIS — Z95811 Presence of heart assist device: Secondary | ICD-10-CM

## 2018-07-19 DIAGNOSIS — Z96651 Presence of right artificial knee joint: Secondary | ICD-10-CM | POA: Insufficient documentation

## 2018-07-19 DIAGNOSIS — Z79899 Other long term (current) drug therapy: Secondary | ICD-10-CM | POA: Insufficient documentation

## 2018-07-19 DIAGNOSIS — I252 Old myocardial infarction: Secondary | ICD-10-CM | POA: Insufficient documentation

## 2018-07-19 DIAGNOSIS — Z7982 Long term (current) use of aspirin: Secondary | ICD-10-CM | POA: Insufficient documentation

## 2018-07-19 DIAGNOSIS — E785 Hyperlipidemia, unspecified: Secondary | ICD-10-CM | POA: Diagnosis not present

## 2018-07-19 DIAGNOSIS — I4729 Other ventricular tachycardia: Secondary | ICD-10-CM

## 2018-07-19 DIAGNOSIS — Z7901 Long term (current) use of anticoagulants: Secondary | ICD-10-CM | POA: Diagnosis not present

## 2018-07-19 DIAGNOSIS — I472 Ventricular tachycardia: Secondary | ICD-10-CM

## 2018-07-19 DIAGNOSIS — Z79890 Hormone replacement therapy (postmenopausal): Secondary | ICD-10-CM | POA: Diagnosis not present

## 2018-07-19 DIAGNOSIS — I110 Hypertensive heart disease with heart failure: Secondary | ICD-10-CM | POA: Insufficient documentation

## 2018-07-19 LAB — BASIC METABOLIC PANEL
Anion gap: 6 (ref 5–15)
BUN: 20 mg/dL (ref 8–23)
CO2: 24 mmol/L (ref 22–32)
Calcium: 8.9 mg/dL (ref 8.9–10.3)
Chloride: 108 mmol/L (ref 98–111)
Creatinine, Ser: 1.51 mg/dL — ABNORMAL HIGH (ref 0.61–1.24)
GFR calc Af Amer: 51 mL/min — ABNORMAL LOW (ref >60)
GFR calc non Af Amer: 44 mL/min — ABNORMAL LOW (ref >60)
Glucose, Bld: 131 mg/dL — ABNORMAL HIGH (ref 70–99)
Potassium: 3.8 mmol/L (ref 3.5–5.1)
Sodium: 138 mmol/L (ref 135–145)

## 2018-07-19 LAB — CBC
HCT: 47 % (ref 39.0–52.0)
Hemoglobin: 14.4 g/dL (ref 13.0–17.0)
MCH: 28.7 pg (ref 26.0–34.0)
MCHC: 30.6 g/dL (ref 30.0–36.0)
MCV: 93.8 fL (ref 80.0–100.0)
Platelets: 154 10*3/uL (ref 150–400)
RBC: 5.01 MIL/uL (ref 4.22–5.81)
RDW: 15 % (ref 11.5–15.5)
WBC: 5 10*3/uL (ref 4.0–10.5)
nRBC: 0 % (ref 0.0–0.2)

## 2018-07-19 LAB — PROTIME-INR
INR: 2.65
Prothrombin Time: 27.9 seconds — ABNORMAL HIGH (ref 11.4–15.2)

## 2018-07-19 LAB — LACTATE DEHYDROGENASE: LDH: 192 U/L (ref 98–192)

## 2018-07-19 NOTE — Progress Notes (Signed)
Patient presents for 2 month follow up in [facility] today. Reports no problems with VAD equipment or concerns with drive line.   Pt continues to walk 2.5 miles weekly at VAD walking club with some SOB at end of walk. He has had a significant amount of right knee pain. Patient has questions regarding possible knee replacement. Made aware that we would discuss in [facility], and with Dr. Haroldine Laws.     Vital Signs:  Doppler Pressure:  72 Automatc BP:  100/72 (83) HR:  81 SPO2: 97% on RA  Weight: 170.8 lb w/o eqt Last weight: 167 lb  VAD Indication: Destination therapy- patient choice    VAD interrogation & Equipment Management: Speed: 9200 Flow:  5.5 Power: 5.7 w    PI:  6.5  Alarms: few low voltage Events: 5 - 20 PI events daily  Fixed speed 9200  Low speed limit: 8600  Primary Controller:  Replace back up battery in 28 months Back up controller:  Replace back up battery in 26 months  Annual Equipment Maintenance on UBC/PM was performed on 08/2017  I reviewed the LVAD parameters from today and compared the results to the patient's prior recorded data. LVAD interrogation was NEGATIVE for significant power changes, NEGATIVE for clinical alarms and STABLE for PI events/speed drops. No programming changes were made and pump is functioning within specified parameters. Pt is performing daily controller and system monitor self tests along with completing weekly and monthly maintenance for LVAD equipment.  Exit Site Care: Drive line is being maintained weekly using weekly dressing kit [name], his wife. Dressing dry and intact. Drive line site well healed and incorporated. Velour fully implanted at exit site. No redness noted. Stabilization device present and accurately applied. Pt denies fever or chills. Pt has CHG allergy. Pt provided with 8 weekly dressing kits.  Significant Events on VAD Support:  10/2013>SBO 12/2016> elevated LDH, admit for Bival  Device:Medtronic ERI Therapies:  off Last check:  09/23/17  BP & Labs:  Doppler BP 72 - Doppler is reflecting MAP  Hgb 14.4 - No S/S of bleeding. Specifically denies melena/BRBPR or nosebleeds.  LDH 192 and is within established baseline of 200- 250. Denies tea-colored urine. No power elevations noted on interrogation.    Patient Instructions:  1. No change in medications. 3.  Return to clinic in 2 months  Emerson Monte RN [place] Coordinator  Office: [phone]  24/7 Pager: [phone]

## 2018-07-19 NOTE — Progress Notes (Signed)
VAD CLINIC NOTE   HPI:  Johnny Oneill is a 77 y.o. 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   Admitted 3/4-3/25/15 with SOB and syncopal episode. Found to have SBO and NG placed and started on TPN. On 11/06/13 developed progressive SOB and hypotension and co-ox 51% and Hgb 6.7. Started on milrinone and transfused. Echo showed nl RV function. Patient had PEA arrest and was intubated and started on pressors. CXR revealed a R hemothorax and a R CT placed. He required VATs and evacuation of hemothorax and was then teansferred to CIR.   LVAD Issues  Elevated LDH--bivalirudin. D/C LDH 188  He presents today for regular follow up. Doing great overall. Having problem with his R knee. Had the L knee replaced in 2012, and they are considering replacing the R. Got a cortisone shot several weeks ago that wasn't very effective.  Denies lightheadedness, dizziness, CP, palpitations, fevers, chills, DOE, orthopnea, or PND. No problems with his driveline, fevers or chills. He is taking all medication as directed. He continues to walk weekly. No undue SOB.    Echo 3/18 EF 10-15%. RV moderate HK. LVAD cannula ok. Mild AI  Echo 9/16 EF 20%, LVAD cannula ok moderate RV dilation with mild to moderately decreased RV systolic function.   VAD Indication: Destination therapy- patient choice    VAD interrogation & Equipment Management: Speed: 9200  Flow: 5.5 Power: 5.7 w PI: 6.5  Alarms: several LV  Events: 5-20PI events.   Fixed speed 9200 Low speed limit: 8600  Past Medical History:  Diagnosis Date  . Anxiety   . Automatic implantable cardioverter-defibrillator in situ   . CAD (coronary artery disease)    S/P stenting of LCX in 2004;  s/p Lat MI 2009 with occlusion of the LCX - treated with Promus stenting. Has total occlusion of the RCA.   [name] CHF (congestive heart failure) ([place])   . Hypercholesteremia   .  Hypertension   . Hypothyroidism   . ICD (implantable cardiac defibrillator) in place    PROPHYLACTIC      medtronic  . Inferior MI ([place]) "? date"; 2009  . Ischemic cardiomyopathy    a. 10/09 Echo: Sev LV Dysfxn, inf/lat AK, Mod MR.  [name] LVAD (left ventricular assist device) present ([place])   . Osteoarthritis    a. s/p R TKR  . Pacemaker   . PVC (premature ventricular contraction)   . RBBB   . Shortness of breath     Current Outpatient Medications  Medication Sig Dispense Refill  . acetaminophen (TYLENOL) 500 MG chewable tablet Chew 500 mg by mouth every 6 (six) hours as needed for pain.    [name] aspirin EC 325 MG tablet Take 325 mg by mouth daily.    . citalopram (CELEXA) 10 MG tablet Take 1 tablet (10 mg total) by mouth daily. 90 tablet 3  . Coenzyme Q10 (COQ10) 200 MG CAPS Take 200 mg by mouth daily.    [name] doxycycline (VIBRAMYCIN) 50 MG capsule Take 2 capsules (100 mg total) by mouth 2 (two) times daily. 28 capsule 0  . furosemide (LASIX) 20 MG tablet Take 20 mg by mouth daily as needed (Take 20 mg as daily as needed for wt > 153 lbs). Reported on 01/14/2016    . hydrALAZINE (APRESOLINE) 50 MG tablet TAKE ONE TABLET BY MOUTH THREE TIMES DAILY 270 tablet 3  . levothyroxine (SYNTHROID)  137 MCG tablet Take 1 tablet (137 mcg total) by mouth daily before breakfast. 90 tablet 2  . Melatonin 3 MG TABS Take 1 tablet by mouth at bedtime.    . pantoprazole (PROTONIX) 40 MG tablet TAKE 1 TABLET BY MOUTH ONCE DAILY 90 tablet 1  . potassium chloride SA (K-DUR,KLOR-CON) 20 MEQ tablet Take 1 tablet (20 mEq total) by mouth daily as needed. Take one potassium pill when you take your lasix. 90 tablet 3  . Probiotic Product (PROBIOTIC PO) Take 1 capsule by mouth daily.    . ranitidine (ZANTAC) 150 MG tablet Take 150 mg by mouth daily as needed for heartburn.    . rosuvastatin (CRESTOR) 20 MG tablet Take 1 tablet (20 mg total) by mouth daily. 90 tablet 3  . spironolactone (ALDACTONE) 25 MG tablet Take 1 tablet  (25 mg total) by mouth daily. Take in the evening 30 tablet 3  . traZODone (DESYREL) 50 MG tablet Take 1 tablet (50 mg total) by mouth at bedtime. 30 tablet 5  . warfarin (COUMADIN) 5 MG tablet Take 2 tablets (10 mg) daily except 1 tablet (5 mg) on Monday/Friday     No current facility-administered medications for this visit.    Facility-Administered Medications Ordered in Other Visits  Medication Dose Route Frequency Provider Last Rate Last Dose  . etomidate (AMIDATE) injection    Anesthesia Intra-op Myna Bright, CRNA   20 mg at 02/08/14 0915  . rocuronium [facility]) injection    Anesthesia Intra-op Myna Bright, CRNA   50 mg at 02/08/14 0932  . succinylcholine (ANECTINE) injection    Anesthesia Intra-op Myna Bright, CRNA   100 mg at 02/08/14 0915   Ace inhibitors; Diovan [valsartan]; Lipitor [atorvastatin calcium]; and Norvasc [amlodipine]  Review of systems complete and found to be negative unless listed in HPI.    Vitals:   07/19/18 1101 07/19/18 1111  BP: (!) 72/0 100/72  Pulse: 81   SpO2: 97%   Weight: 77.5 kg (170 lb 12.8 oz)      Vital Signs:  Doppler Pressure: 72 Automatc BP:  100/72 (83) HR:  81 SPO2: 97% on RA  Weight: 170 lbs w/o eqt Last weight: 167 lbs   Physical Exam: General: Well appearing this am. NAD.  HEENT: Normal. Neck: Supple, JVP 7-8 cm. Carotids OK.  Cardiac:  Mechanical heart sounds with LVAD hum present.  Lungs:  CTAB, normal effort.  Abdomen:  NT, ND, no HSM. No bruits or masses. +BS  LVAD exit site: Well-healed and incorporated. Dressing dry and intact. No erythema or drainage. Stabilization device present and accurately applied. Driveline dressing changed daily per sterile technique. Extremities:  Warm and dry. No cyanosis, clubbing, rash, or edema.  Neuro:  Alert & oriented x 3. Cranial nerves grossly intact. Moves all 4 extremities w/o difficulty. Affect pleasant     ASSESSMENT AND PLAN:  1) Chronic systolic HF: ICM,  EF 16-94% s/p LVAD HM II implant 08/2013 for DT.   - Echo 3/18 EF 10-15% mild AI. Mod RV dysfunction. LVAD cannula OK - NYHA II symptoms - Volume status stable on exam.  - Continue spiro. Using lasix only prn.  - He has not tolerated ACE-I or amlodipine in the past. - ICD at [facility]. Has seen Dr. Rayann Heman in 2/18 and decided not to replace.   2) LVAD: HMII 08/2013 for DT. - VAD interrogated personally. Parameters stable.   - Admitted in May 2018 with elevated LDH with bival.  - LDH  192. No power spikes.  - Continue aspirin and coumadin.  - Hgb stable at 14.4  3) Chronic anticoagulation: INR goal 2.0-3.0.    - INR 2.65. Discussed dosing with PharmD personally.  - Continue ASA 325 mg for LVAD + warfarin.  4) HTN:  - MAPs well controlled.   5) Hypothyroidism:  - Followed by Dr Dwyane Dee. Stable.  6) Hyperlipidemia:  - Continue Crestor. Zetia stopped due to cost.  - No change.  7) CAD - No s/s of ischemia.     Doing well. LDH back down after recent gradual uptrend. No changes. RTC 2 months. Sooner with symptoms. Will need to discuss R knee and if appropriate/necessary to pursue replacement at this time at potential risk to pump.   Shirley Friar, PA-C  8:51 AM   Greater than 50% of the 40 minute visit was spent in counseling/coordination of care regarding disease state education, salt/fluid restriction, sliding scale diuretics, and medication compliance.

## 2018-07-29 ENCOUNTER — Other Ambulatory Visit (HOSPITAL_COMMUNITY): Payer: Self-pay | Admitting: Internal Medicine

## 2018-07-29 DIAGNOSIS — K219 Gastro-esophageal reflux disease without esophagitis: Secondary | ICD-10-CM

## 2018-08-09 ENCOUNTER — Ambulatory Visit (HOSPITAL_COMMUNITY): Payer: Self-pay | Admitting: Pharmacist

## 2018-08-09 ENCOUNTER — Ambulatory Visit (HOSPITAL_COMMUNITY)
Admission: RE | Admit: 2018-08-09 | Discharge: 2018-08-09 | Disposition: A | Discharge status: Home / Self Care | Payer: Medicare Other | Source: Ambulatory Visit | Attending: Internal Medicine | Admitting: Internal Medicine

## 2018-08-09 DIAGNOSIS — Z95811 Presence of heart assist device: Secondary | ICD-10-CM | POA: Insufficient documentation

## 2018-08-09 LAB — LACTATE DEHYDROGENASE: LDH: 206 U/L — ABNORMAL HIGH (ref 98–192)

## 2018-08-09 LAB — PROTIME-INR
INR: 2.8
Prothrombin Time: 29.1 seconds — ABNORMAL HIGH (ref 11.4–15.2)

## 2018-08-18 ENCOUNTER — Telehealth (HOSPITAL_COMMUNITY): Payer: Self-pay | Admitting: *Deleted

## 2018-08-18 NOTE — Telephone Encounter (Signed)
Wife called VAD clinic to report pt has had diarrhea for last 4 days; 3 - 4 stools per day. Current VAD parameters: Flow 4.7, Speed 9200, PI 7.4, power 5.2w with no VAD alarms. Pt denies any N&V, is able to drink fluids. Pt feels "ok, just a little weak". Pt has taken 3 doses of imodium.   Dr. Haroldine Laws updated - instructed pt to push drinking fluids, no solid foods until diarrhea subsides. He may then slowly introduce solid foods following BRAT diet (bananas, rice, applesauce, toast)  back into his diet. Instructed pt to call VAD pager if any low flow alarms, fevers/chills, or abdominal pain. Pt verbalized understanding of above.   Zada Girt RN, V[facility] Coordinatorl 24/7 VAD pager: [phone]

## 2018-08-25 ENCOUNTER — Other Ambulatory Visit (HOSPITAL_COMMUNITY): Payer: Self-pay | Admitting: Internal Medicine

## 2018-08-25 DIAGNOSIS — E785 Hyperlipidemia, unspecified: Secondary | ICD-10-CM

## 2018-08-25 DIAGNOSIS — I5043 Acute on chronic combined systolic (congestive) and diastolic (congestive) heart failure: Secondary | ICD-10-CM

## 2018-08-26 ENCOUNTER — Other Ambulatory Visit (HOSPITAL_COMMUNITY): Payer: Self-pay | Admitting: *Deleted

## 2018-08-26 DIAGNOSIS — Z7901 Long term (current) use of anticoagulants: Secondary | ICD-10-CM

## 2018-08-26 DIAGNOSIS — Z95811 Presence of heart assist device: Secondary | ICD-10-CM

## 2018-08-30 ENCOUNTER — Other Ambulatory Visit (HOSPITAL_COMMUNITY): Payer: Self-pay | Admitting: *Deleted

## 2018-08-30 DIAGNOSIS — I5043 Acute on chronic combined systolic (congestive) and diastolic (congestive) heart failure: Secondary | ICD-10-CM

## 2018-08-30 DIAGNOSIS — E785 Hyperlipidemia, unspecified: Secondary | ICD-10-CM

## 2018-08-30 MED ORDER — ROSUVASTATIN CALCIUM 20 MG PO TABS: 20.0000 mg | ORAL_TABLET | Freq: Every day | ORAL | 0 refills | Status: DC

## 2018-09-01 ENCOUNTER — Ambulatory Visit (HOSPITAL_COMMUNITY): Payer: Self-pay | Admitting: Pharmacist

## 2018-09-01 ENCOUNTER — Ambulatory Visit (HOSPITAL_COMMUNITY)
Admission: RE | Admit: 2018-09-01 | Discharge: 2018-09-01 | Disposition: A | Discharge status: Home / Self Care | Payer: Medicare Other | Source: Ambulatory Visit | Attending: Cardiology | Admitting: Cardiology

## 2018-09-01 DIAGNOSIS — Z95811 Presence of heart assist device: Secondary | ICD-10-CM | POA: Diagnosis not present

## 2018-09-01 DIAGNOSIS — Z7901 Long term (current) use of anticoagulants: Secondary | ICD-10-CM | POA: Diagnosis not present

## 2018-09-01 LAB — LACTATE DEHYDROGENASE: LDH: 236 U/L — ABNORMAL HIGH (ref 98–192)

## 2018-09-01 LAB — PROTIME-INR
INR: 2.67
Prothrombin Time: 28 seconds — ABNORMAL HIGH (ref 11.4–15.2)

## 2018-09-07 ENCOUNTER — Encounter (HOSPITAL_COMMUNITY): Payer: Self-pay | Admitting: *Deleted

## 2018-09-19 ENCOUNTER — Other Ambulatory Visit (HOSPITAL_COMMUNITY): Payer: Self-pay | Admitting: *Deleted

## 2018-09-19 ENCOUNTER — Telehealth (HOSPITAL_COMMUNITY): Payer: Self-pay | Admitting: *Deleted

## 2018-09-19 DIAGNOSIS — B49 Unspecified mycosis: Secondary | ICD-10-CM

## 2018-09-19 DIAGNOSIS — Z7901 Long term (current) use of anticoagulants: Secondary | ICD-10-CM

## 2018-09-19 DIAGNOSIS — Z95811 Presence of heart assist device: Secondary | ICD-10-CM

## 2018-09-19 MED ORDER — FLUCONAZOLE 100 MG PO TABS: 100.0000 mg | ORAL_TABLET | Freq: Every day | ORAL | 0 refills | Status: DC

## 2018-09-19 NOTE — Telephone Encounter (Signed)
Wife called to report during pt's DL site during dressing change last night was reddened, and pt c/o itching. She says it looks similar to fungal infections in the past. Pt is wearing [place] dressing and wife reports sweating last week with warm weather.        Above reviewed with Darrick Grinder, NP.   Per Darrick Grinder, NP, called wife and informed her to switch to gauze dressings, will send Rx for Diflucan 100 mg x 2 days. Wife verbalized understanding of same.  Pt has scheduled appt tomorrow in VAD clinic.   Will update Bonnita Nasuti, PharmD re: above med and pt f/u.  Zada Girt RN, [place] Coordinator [phone]

## 2018-09-20 ENCOUNTER — Ambulatory Visit (HOSPITAL_COMMUNITY)
Admission: RE | Admit: 2018-09-20 | Discharge: 2018-09-20 | Disposition: A | Discharge status: Home / Self Care | Payer: Medicare Other | Source: Ambulatory Visit | Attending: Cardiology | Admitting: Cardiology

## 2018-09-20 ENCOUNTER — Encounter (HOSPITAL_COMMUNITY): Payer: Self-pay

## 2018-09-20 VITALS — BP 93/53 | HR 77 | Wt 166.4 lb

## 2018-09-20 DIAGNOSIS — I4510 Unspecified right bundle-branch block: Secondary | ICD-10-CM | POA: Insufficient documentation

## 2018-09-20 DIAGNOSIS — Z79890 Hormone replacement therapy (postmenopausal): Secondary | ICD-10-CM | POA: Diagnosis not present

## 2018-09-20 DIAGNOSIS — I252 Old myocardial infarction: Secondary | ICD-10-CM | POA: Insufficient documentation

## 2018-09-20 DIAGNOSIS — Z7901 Long term (current) use of anticoagulants: Secondary | ICD-10-CM | POA: Diagnosis not present

## 2018-09-20 DIAGNOSIS — Z7982 Long term (current) use of aspirin: Secondary | ICD-10-CM | POA: Insufficient documentation

## 2018-09-20 DIAGNOSIS — Z95810 Presence of automatic (implantable) cardiac defibrillator: Secondary | ICD-10-CM | POA: Diagnosis not present

## 2018-09-20 DIAGNOSIS — I2510 Atherosclerotic heart disease of native coronary artery without angina pectoris: Secondary | ICD-10-CM | POA: Insufficient documentation

## 2018-09-20 DIAGNOSIS — Z79899 Other long term (current) drug therapy: Secondary | ICD-10-CM | POA: Insufficient documentation

## 2018-09-20 DIAGNOSIS — F419 Anxiety disorder, unspecified: Secondary | ICD-10-CM | POA: Insufficient documentation

## 2018-09-20 DIAGNOSIS — I159 Secondary hypertension, unspecified: Secondary | ICD-10-CM

## 2018-09-20 DIAGNOSIS — E039 Hypothyroidism, unspecified: Secondary | ICD-10-CM | POA: Diagnosis not present

## 2018-09-20 DIAGNOSIS — B49 Unspecified mycosis: Secondary | ICD-10-CM | POA: Diagnosis not present

## 2018-09-20 DIAGNOSIS — E785 Hyperlipidemia, unspecified: Secondary | ICD-10-CM | POA: Diagnosis not present

## 2018-09-20 DIAGNOSIS — I5022 Chronic systolic (congestive) heart failure: Secondary | ICD-10-CM | POA: Diagnosis not present

## 2018-09-20 DIAGNOSIS — Z95811 Presence of heart assist device: Secondary | ICD-10-CM

## 2018-09-20 DIAGNOSIS — M1990 Unspecified osteoarthritis, unspecified site: Secondary | ICD-10-CM | POA: Diagnosis not present

## 2018-09-20 DIAGNOSIS — I255 Ischemic cardiomyopathy: Secondary | ICD-10-CM | POA: Diagnosis not present

## 2018-09-20 DIAGNOSIS — I472 Ventricular tachycardia: Secondary | ICD-10-CM

## 2018-09-20 DIAGNOSIS — I110 Hypertensive heart disease with heart failure: Secondary | ICD-10-CM | POA: Insufficient documentation

## 2018-09-20 DIAGNOSIS — I4729 Other ventricular tachycardia: Secondary | ICD-10-CM

## 2018-09-20 LAB — COMPREHENSIVE METABOLIC PANEL
ALT: 18 U/L (ref 0–44)
AST: 22 U/L (ref 15–41)
Albumin: 4.2 g/dL (ref 3.5–5.0)
Alkaline Phosphatase: 60 U/L (ref 38–126)
Anion gap: 8 (ref 5–15)
BUN: 19 mg/dL (ref 8–23)
CO2: 24 mmol/L (ref 22–32)
Calcium: 9.3 mg/dL (ref 8.9–10.3)
Chloride: 107 mmol/L (ref 98–111)
Creatinine, Ser: 1.45 mg/dL — ABNORMAL HIGH (ref 0.61–1.24)
GFR calc Af Amer: 54 mL/min — ABNORMAL LOW (ref >60)
GFR calc non Af Amer: 47 mL/min — ABNORMAL LOW (ref >60)
Glucose, Bld: 88 mg/dL (ref 70–99)
Potassium: 4.1 mmol/L (ref 3.5–5.1)
Sodium: 139 mmol/L (ref 135–145)
Total Bilirubin: 1.1 mg/dL (ref 0.3–1.2)
Total Protein: 7.1 g/dL (ref 6.5–8.1)

## 2018-09-20 LAB — CBC
HCT: 46.6 % (ref 39.0–52.0)
Hemoglobin: 14.5 g/dL (ref 13.0–17.0)
MCH: 28.9 pg (ref 26.0–34.0)
MCHC: 31.1 g/dL (ref 30.0–36.0)
MCV: 93 fL (ref 80.0–100.0)
Platelets: 167 10*3/uL (ref 150–400)
RBC: 5.01 MIL/uL (ref 4.22–5.81)
RDW: 15.3 % (ref 11.5–15.5)
WBC: 6.4 10*3/uL (ref 4.0–10.5)
nRBC: 0 % (ref 0.0–0.2)

## 2018-09-20 LAB — PROTIME-INR
INR: 2.6
Prothrombin Time: 27.5 seconds — ABNORMAL HIGH (ref 11.4–15.2)

## 2018-09-20 LAB — PREALBUMIN: Prealbumin: 21.6 mg/dL (ref 18–38)

## 2018-09-20 LAB — LACTATE DEHYDROGENASE: LDH: 217 U/L — ABNORMAL HIGH (ref 98–192)

## 2018-09-20 NOTE — Progress Notes (Signed)
Patient presents for 2 month follow up in VAD Clinic today. Reports no problems with VAD equipment or concerns with drive line.   Drive line site red and itching. Diflucan 100mg x 2 days ordered yesterday. Pharmacy did not have medication ready for pick up yesterday, so he plans to pick it up on his way home today. See dressing change portion of note.   Vital Signs:  Doppler Pressure:  82 Automatc BP:  93/53 (75) HR:  77 SPO2: 95% on RA  Weight: 166.4 lb w/o eqt Last weight: 170.8 lb  VAD Indication: Destination therapy- patient choice    VAD interrogation & Equipment Management: Speed: 9200 Flow:  4.7 Power: 5.2 w    PI:  7.3  Alarms: few low voltage Events: 5-10 PI events daily  Fixed speed 9200  Low speed limit: 8600  Primary Controller:  Replace back up battery in 24 months Back up controller:  Replace back up battery in 22 months  Annual Equipment Maintenance on UBC/PM was performed on 08/2018  I reviewed the LVAD parameters from today and compared the results to the patient's prior recorded data. LVAD interrogation was NEGATIVE for significant power changes, NEGATIVE for clinical alarms and STABLE for PI events/speed drops. No programming changes were made and pump is functioning within specified parameters. Pt is performing daily controller and system monitor self tests along with completing weekly and monthly maintenance for LVAD equipment.  Exit Site Care: Drive line is being maintained weekly using weekly dressing kit Judy, his wife. Reports redness and itching under dressing. Similar to fungal infections he has had in the past.  Existing VAD dressing removed and site care performed using sterile technique. Drive line exit site cleaned with sterile saline wipes x 2, allowed to dry, and gauze dressing with silver stripre-applied. Exit site healed and incorporated, the velour is fully implanted at exit site.  Redness and raised rash noted. No tenderness, drainage,  foul odor noted. Stabilization device present and accurately applied. Pt denies fever or chills. Pt has CHG allergy.  Instructed to change dressing every other day using daily kits until redness/rash clears up. Pt provided with 4 anchors. (Has silver strips and daily dressing kits at home.)       Significant Events on VAD Support:  10/2013>SBO 12/2016> elevated LDH, admit for Bival  Device:Medtronic ERI Therapies: off Last check:  09/23/17  BP & Labs:  Doppler BP 82 - Doppler is reflecting MAP  Hgb 14.5  - No S/S of bleeding. Specifically denies melena/BRBPR or nosebleeds.  LDH 217  and is within established baseline of 200- 250. Denies tea-colored urine. No power elevations noted on interrogation.   5 year Intermacs follow up completed including:  Quality of Life, KCCQ-12, and Neurocognitive trail making.   Pt completed 1620 feet during 6 minute walk.  Back up controller: 11V backup battery charged during this visit.  Reviewed and demonstrated the following to patient and caregiver:               Reviewed the steps for replacing the running system controller with the back-up system controller (see patient handbook section 2 or the appropriate pamphlet).             X  Demonstrated (using the mock-driveline and controller) how to connect and disconnect the mock-driveline in back up system controller in a timely manner (less than 10 seconds) with return demonstration by patient and caregiver.                X   Reviewed system controller alarms and troubleshooting including hazard and advisory alarms and accessing alarm history. Re-enforced NOT TO attempt to perform any task displayed on the display screen alone or without calling the VAD pager [phone] (see patient handbook section 5 or the appropriate pamphlet).                       X  Reviewed how to handle an emergency including when the pump is running and when the pump has stopped (see patient handbook section 8).  Call 911 first and then the VAD pager at [phone].             X   Reviewed 14-volt lithium ion battery calibration steps (see patient handbook section 3).            X   Reviewed contents of black bag and what must be with patient at all times.            X  Reviewed driveline exit site including cleansing, dressing, and immobilizing with an anchor device to prevent exit site trauma.             X  Reviewed weekly maintenance which includes: Reviewing "Replacing the Running System Controller with a Backup Controller" pamphlet. Clean batteries, clips, and battery charger contacts.  Check cables for damages. Rotate batteries; keep all 8 charged.              X  Reviewed monthly maintenance which includes: Reviewing Alarms and Troubleshooting. Check battery manufacturer dates. Check use/charge cycles for each battery; remember to re-calibrate every 70 uses when prompted.              X  Additional pamphlets Guide to Replacing the Running System Controller with the Backup Controller for Patients and Their Caregivers and HM II Alarms for Patients and Their Caregivers given to patient.              X   Annual maintenance completed per [employer] on patient's home power module and universal battery charger.     Patient cable replaced: P/N: [id number]  L/N: [id number] Manufacture date: 02/2018 Power module internal battery replaced: Ref # [id number] Manufacture date: 02/14/2018     We will order 2 new sets of batteries.    Patient Instructions:  1. No change in medications. 2.  Return to clinic in 2 months    RN VAD Coordinator  Office: [phone]  24/7 Pager: [phone]    

## 2018-09-20 NOTE — Progress Notes (Signed)
VAD CLINIC NOTE   HPI:  Johnny Oneill is a 78 y.o. 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   Admitted 3/4-3/25/15 with SOB and syncopal episode. Found to have SBO and NG placed and started on TPN. On 11/06/13 developed progressive SOB and hypotension and co-ox 51% and Hgb 6.7. Started on milrinone and transfused. Echo showed nl RV function. Patient had PEA arrest and was intubated and started on pressors. CXR revealed a R hemothorax and a R CT placed. He required VATs and evacuation of hemothorax and was then teansferred to CIR.   LVAD Issues  Elevated LDH--bivalirudin. D/C LDH 188  He presents today for regular follow up. He is doing great overall 5 years post LVAD. Continues to have R knee arthritis, slightly improved with brace. No lightheadedness, dizziness, CP, palpitations, fevers, chills, DOE, orthopnea, or PND. Completed 5 year intermacs today with no problems. Having fungal rash around driveline, which happens for him on occasion. Was sweating last week with hot weather. No undue SOB. He is taking all medications as directed.   Echo 3/18 EF 10-15%. RV moderate HK. LVAD cannula ok. Mild AI  Echo 9/16 EF 20%, LVAD cannula ok moderate RV dilation with mild to moderately decreased RV systolic function.   VAD Indication: Destination therapy- patient choice    VAD interrogation & Equipment Management: Speed: 9200 Flow: 4.7 Power: 5.2 w PI: 7.3  Alarms: Occasional low voltage. Encouraged not to let batteries run low.   Events: 5-10 PI events daily.   Fixed speed: 9200 Low speed limit: 8600  Past Medical History:  Diagnosis Date  . Anxiety   . Automatic implantable cardioverter-defibrillator in situ   . CAD (coronary artery disease)    S/P stenting of LCX in 2004;  s/p Lat MI 2009 with occlusion of the LCX - treated with Promus stenting. Has total occlusion of the RCA.   [name] CHF  (congestive heart failure) ([place])   . Hypercholesteremia   . Hypertension   . Hypothyroidism   . ICD (implantable cardiac defibrillator) in place    PROPHYLACTIC      medtronic  . Inferior MI ([place]) "? date"; 2009  . Ischemic cardiomyopathy    a. 10/09 Echo: Sev LV Dysfxn, inf/lat AK, Mod MR.  [name] LVAD (left ventricular assist device) present ([place])   . Osteoarthritis    a. s/p R TKR  . Pacemaker   . PVC (premature ventricular contraction)   . RBBB   . Shortness of breath     Current Outpatient Medications  Medication Sig Dispense Refill  . acetaminophen (TYLENOL) 500 MG chewable tablet Chew 500 mg by mouth every 6 (six) hours as needed for pain.    [name] aspirin EC 325 MG tablet Take 325 mg by mouth daily.    . citalopram (CELEXA) 10 MG tablet Take 1 tablet (10 mg total) by mouth daily. 90 tablet 3  . Coenzyme Q10 (COQ10) 200 MG CAPS Take 200 mg by mouth daily.    [name] doxycycline (VIBRAMYCIN) 50 MG capsule Take 2 capsules (100 mg total) by mouth 2 (two) times daily. 28 capsule 0  . fluconazole (DIFLUCAN) 100 MG tablet Take 1 tablet (100 mg total) by mouth daily. 2 tablet 0  . furosemide (LASIX) 20 MG tablet Take 20 mg by mouth daily as needed (Take 20 mg as daily as needed for wt > 153 lbs). Reported on  01/14/2016    . hydrALAZINE (APRESOLINE) 50 MG tablet TAKE ONE TABLET BY MOUTH THREE TIMES DAILY 270 tablet 3  . levothyroxine (SYNTHROID) 137 MCG tablet Take 1 tablet (137 mcg total) by mouth daily before breakfast. 90 tablet 2  . Melatonin 3 MG TABS Take 1 tablet by mouth at bedtime.    . pantoprazole (PROTONIX) 40 MG tablet TAKE 1 TABLET BY MOUTH ONCE DAILY 90 tablet 1  . potassium chloride SA (K-DUR,KLOR-CON) 20 MEQ tablet Take 1 tablet (20 mEq total) by mouth daily as needed. Take one potassium pill when you take your lasix. (Patient not taking: Reported on 07/19/2018) 90 tablet 3  . Probiotic Product (PROBIOTIC PO) Take 1 capsule by mouth daily.    . ranitidine (ZANTAC) 150 MG tablet Take  150 mg by mouth daily as needed for heartburn.    . rosuvastatin (CRESTOR) 20 MG tablet Take 1 tablet (20 mg total) by mouth daily. 90 tablet 0  . spironolactone (ALDACTONE) 25 MG tablet Take 1 tablet (25 mg total) by mouth daily. Take in the evening 30 tablet 3  . traZODone (DESYREL) 50 MG tablet Take 1 tablet (50 mg total) by mouth at bedtime. 30 tablet 5  . warfarin (COUMADIN) 5 MG tablet Take 2 tablets (10 mg) daily except 1 tablet (5 mg) on Monday/Friday     No current facility-administered medications for this visit.    Facility-Administered Medications Ordered in Other Visits  Medication Dose Route Frequency Provider Last Rate Last Dose  . etomidate (AMIDATE) injection    Anesthesia Intra-op Myna Bright, CRNA   20 mg at 02/08/14 0915  . rocuronium [facility]) injection    Anesthesia Intra-op Myna Bright, CRNA   50 mg at 02/08/14 0932  . succinylcholine (ANECTINE) injection    Anesthesia Intra-op Myna Bright, CRNA   100 mg at 02/08/14 0915   Ace inhibitors; Diovan [valsartan]; Lipitor [atorvastatin calcium]; and Norvasc [amlodipine]  Review of systems complete and found to be negative unless listed in HPI.    Vitals:   09/20/18 1136 09/20/18 1137  BP: (!) 82/0 (!) 93/53  Pulse: 77   SpO2: 95%   Weight: 75.5 kg (166 lb 6.4 oz)    Vital Signs:  Doppler Pressure: 82 Automatc BP:  93/53 (75) HR:  77 SPO2: 95% on RA  Weight: 166 lbs 6.4 oz w/o eqt Last weight: 170 lbs   Physical Exam: General: Well appearing this am. NAD.  HEENT: Normal. Neck: Supple, JVP 7-8 cm. Carotids OK.  Cardiac:  Mechanical heart sounds with LVAD hum present.  Lungs:  CTAB, normal effort.  Abdomen:  NT, ND, no HSM. No bruits or masses. +BS  LVAD exit site: Well-healed and incorporated. Dressing dry and intact. He has redness with central clearing around his driveline.  Stabilization device present and accurately applied. Driveline dressing changed daily per sterile  technique. Extremities:  Warm and dry. No cyanosis, clubbing, rash, or edema.  Neuro:  Alert & oriented x 3. Cranial nerves grossly intact. Moves all 4 extremities w/o difficulty. Affect pleasant     ASSESSMENT AND PLAN:  1) Chronic systolic HF: ICM, EF 75-10% s/p LVAD HM II implant 08/2013 for DT.   - Echo 3/18 EF 10-15% mild AI. Mod RV dysfunction. LVAD cannula OK - NYHA II symptoms - Volume status stable on exam.  - Continue lasix prn only.  - Continue spiro 25 mg daily.  - He has not tolerated ACE-I or amlodipine in the past. -  ICD at [facility]. Has seen Dr. Rayann Heman in 2/18 and decided not to replace.   2) LVAD: HMII 08/2013 for DT. - VAD interrogated personally. Parameters stable.   - Admitted in May 2018 with elevated LDH with bival.  - LDH 217. No power spikes.  - Continue aspirin and coumadin.  - Hgb stable at 14.5 3) Chronic anticoagulation: INR goal 2.0-3.0.    - INR 2.6. Dosing per PharmD.  - Continue ASA 325 mg for LVAD + warfarin.  4) HTN:  - MAPs well controlled.  5) Hypothyroidism:  - Followed by Dr Dwyane Dee. Stable.  6) Hyperlipidemia:  - Continue Crestor. Zetia stopped due to cost.  - No change.  7) CAD - No s/s of ischemia.    8) Driveline rash - Will treat with Diflucan. See pictures above in RN note.   He is doing great 5 years post VAD. Will treat rash around driveline and follow up. Otherwise, no changes today.   Shirley Friar, PA-C  8:50 AM   Greater than 50% of the 40 minute visit was spent in counseling/coordination of care regarding disease state education, salt/fluid restriction, sliding scale diuretics, and medication compliance.

## 2018-09-21 ENCOUNTER — Ambulatory Visit (HOSPITAL_COMMUNITY): Payer: Self-pay | Admitting: Pharmacist

## 2018-10-03 ENCOUNTER — Other Ambulatory Visit (HOSPITAL_COMMUNITY): Payer: Self-pay | Admitting: Unknown Physician Specialty

## 2018-10-03 DIAGNOSIS — Z7901 Long term (current) use of anticoagulants: Secondary | ICD-10-CM

## 2018-10-03 DIAGNOSIS — Z95811 Presence of heart assist device: Secondary | ICD-10-CM

## 2018-10-04 ENCOUNTER — Ambulatory Visit (HOSPITAL_COMMUNITY)
Admission: RE | Admit: 2018-10-04 | Discharge: 2018-10-04 | Disposition: A | Discharge status: Home / Self Care | Payer: Medicare Other | Source: Ambulatory Visit | Attending: Cardiology | Admitting: Cardiology

## 2018-10-04 ENCOUNTER — Ambulatory Visit (HOSPITAL_COMMUNITY): Payer: Self-pay | Admitting: Pharmacist

## 2018-10-04 DIAGNOSIS — Z7901 Long term (current) use of anticoagulants: Secondary | ICD-10-CM | POA: Diagnosis not present

## 2018-10-04 DIAGNOSIS — Z95811 Presence of heart assist device: Secondary | ICD-10-CM | POA: Diagnosis not present

## 2018-10-04 LAB — PROTIME-INR
INR: 3.19
Prothrombin Time: 32.2 seconds — ABNORMAL HIGH (ref 11.4–15.2)

## 2018-10-14 ENCOUNTER — Other Ambulatory Visit (HOSPITAL_COMMUNITY): Payer: Self-pay | Admitting: *Deleted

## 2018-10-14 DIAGNOSIS — Z7901 Long term (current) use of anticoagulants: Secondary | ICD-10-CM

## 2018-10-14 DIAGNOSIS — Z95811 Presence of heart assist device: Secondary | ICD-10-CM

## 2018-10-18 ENCOUNTER — Ambulatory Visit (HOSPITAL_COMMUNITY)
Admission: RE | Admit: 2018-10-18 | Discharge: 2018-10-18 | Disposition: A | Discharge status: Home / Self Care | Payer: Medicare Other | Source: Ambulatory Visit | Attending: Cardiology | Admitting: Cardiology

## 2018-10-18 ENCOUNTER — Ambulatory Visit (HOSPITAL_COMMUNITY): Payer: Self-pay | Admitting: Pharmacist

## 2018-10-18 DIAGNOSIS — Z7901 Long term (current) use of anticoagulants: Secondary | ICD-10-CM | POA: Diagnosis not present

## 2018-10-18 DIAGNOSIS — Z95811 Presence of heart assist device: Secondary | ICD-10-CM | POA: Diagnosis not present

## 2018-10-18 LAB — PROTIME-INR
INR: 3.07
Prothrombin Time: 31.3 seconds — ABNORMAL HIGH (ref 11.4–15.2)

## 2018-10-28 ENCOUNTER — Other Ambulatory Visit (HOSPITAL_COMMUNITY): Payer: Self-pay | Admitting: *Deleted

## 2018-10-28 DIAGNOSIS — Z7901 Long term (current) use of anticoagulants: Secondary | ICD-10-CM

## 2018-10-28 DIAGNOSIS — Z95811 Presence of heart assist device: Secondary | ICD-10-CM

## 2018-11-01 ENCOUNTER — Ambulatory Visit (HOSPITAL_COMMUNITY)
Admission: RE | Admit: 2018-11-01 | Discharge: 2018-11-01 | Disposition: A | Discharge status: Home / Self Care | Payer: Medicare Other | Source: Ambulatory Visit | Attending: Cardiology | Admitting: Cardiology

## 2018-11-01 ENCOUNTER — Ambulatory Visit (HOSPITAL_COMMUNITY): Payer: Self-pay | Admitting: Pharmacist

## 2018-11-01 DIAGNOSIS — Z95811 Presence of heart assist device: Secondary | ICD-10-CM | POA: Insufficient documentation

## 2018-11-01 DIAGNOSIS — Z7901 Long term (current) use of anticoagulants: Secondary | ICD-10-CM | POA: Insufficient documentation

## 2018-11-01 LAB — PROTIME-INR
INR: 2.8 — ABNORMAL HIGH (ref 0.8–1.2)
Prothrombin Time: 28.9 seconds — ABNORMAL HIGH (ref 11.4–15.2)

## 2018-11-01 LAB — LACTATE DEHYDROGENASE: LDH: 198 U/L — ABNORMAL HIGH (ref 98–192)

## 2018-11-11 ENCOUNTER — Other Ambulatory Visit (HOSPITAL_COMMUNITY): Payer: Self-pay | Admitting: Internal Medicine

## 2018-11-11 DIAGNOSIS — I10 Essential (primary) hypertension: Secondary | ICD-10-CM

## 2018-11-15 ENCOUNTER — Telehealth (HOSPITAL_COMMUNITY): Payer: Self-pay | Admitting: Licensed Clinical Social Worker

## 2018-11-15 NOTE — Telephone Encounter (Signed)
CSW contacted patient to assure food, medications and confirmation of VAD pager number if concerns or emergency arise. Patient instructed to stay home and use of proper hygiene and call if needed.  Patient informed that VAD team will call the day before any upcoming appointments with instructions due to current CoVid 19 outbreak. Patient verbalizes understanding and denies any current concerns. [name], LCSW, CCSW-MCS [phone]  

## 2018-11-18 ENCOUNTER — Other Ambulatory Visit (HOSPITAL_COMMUNITY): Payer: Self-pay | Admitting: *Deleted

## 2018-11-18 DIAGNOSIS — Z95811 Presence of heart assist device: Secondary | ICD-10-CM

## 2018-11-18 DIAGNOSIS — Z7901 Long term (current) use of anticoagulants: Secondary | ICD-10-CM

## 2018-11-21 ENCOUNTER — Other Ambulatory Visit: Payer: Self-pay

## 2018-11-21 ENCOUNTER — Ambulatory Visit (HOSPITAL_COMMUNITY): Payer: Self-pay | Admitting: Pharmacist

## 2018-11-21 ENCOUNTER — Ambulatory Visit (HOSPITAL_COMMUNITY)
Admission: RE | Admit: 2018-11-21 | Discharge: 2018-11-21 | Disposition: A | Discharge status: Home / Self Care | Payer: Medicare Other | Source: Ambulatory Visit | Attending: Cardiology | Admitting: Cardiology

## 2018-11-21 ENCOUNTER — Encounter (HOSPITAL_COMMUNITY): Payer: Medicare Other

## 2018-11-21 DIAGNOSIS — Z7901 Long term (current) use of anticoagulants: Secondary | ICD-10-CM | POA: Diagnosis not present

## 2018-11-21 DIAGNOSIS — Z95811 Presence of heart assist device: Secondary | ICD-10-CM | POA: Insufficient documentation

## 2018-11-21 LAB — PROTIME-INR
INR: 2.4 — ABNORMAL HIGH (ref 0.8–1.2)
Prothrombin Time: 26 seconds — ABNORMAL HIGH (ref 11.4–15.2)

## 2018-11-21 NOTE — Progress Notes (Signed)
Patient picked up two new sets of 14V Li-ion batteries for HM LVAD use. Both sets were fully charged with patient identification placed on each battery.   Zada Girt RN, [place] Coordinator [phone]

## 2018-11-21 NOTE — Addendum Note (Signed)
Encounter addended by: Lezlie Octave, RN on: 11/21/2018 2:20 PM  Actions taken: Clinical Note Signed

## 2018-11-30 ENCOUNTER — Telehealth (HOSPITAL_COMMUNITY): Payer: Self-pay | Admitting: Licensed Clinical Social Worker

## 2018-11-30 NOTE — Telephone Encounter (Signed)
CSW contacted patient/caregiver to follow up on status with coronavirus and if anything needed. Patient instructed to stay home and use of proper hygiene and call if needed. Patient denies any concerns at this time and verbalizes understanding of process for contacting VAD Coordinators if needed. CSW continues to follow as needed. Jackie Carroll Ranney, LCSW, CCSW-MCS [phone] 

## 2018-12-02 ENCOUNTER — Ambulatory Visit: Payer: Medicare Other | Admitting: Endocrinology

## 2018-12-05 ENCOUNTER — Other Ambulatory Visit (HOSPITAL_COMMUNITY): Payer: Self-pay | Admitting: *Deleted

## 2018-12-05 DIAGNOSIS — Z95811 Presence of heart assist device: Secondary | ICD-10-CM

## 2018-12-05 DIAGNOSIS — Z7901 Long term (current) use of anticoagulants: Secondary | ICD-10-CM

## 2018-12-06 ENCOUNTER — Other Ambulatory Visit: Payer: Self-pay

## 2018-12-06 ENCOUNTER — Ambulatory Visit (HOSPITAL_COMMUNITY)
Admission: RE | Admit: 2018-12-06 | Discharge: 2018-12-06 | Disposition: A | Discharge status: Home / Self Care | Payer: Medicare Other | Source: Ambulatory Visit | Attending: Internal Medicine | Admitting: Internal Medicine

## 2018-12-06 ENCOUNTER — Ambulatory Visit (HOSPITAL_COMMUNITY): Payer: Self-pay | Admitting: Pharmacist

## 2018-12-06 DIAGNOSIS — Z95811 Presence of heart assist device: Secondary | ICD-10-CM | POA: Diagnosis not present

## 2018-12-06 DIAGNOSIS — Z7901 Long term (current) use of anticoagulants: Secondary | ICD-10-CM | POA: Insufficient documentation

## 2018-12-06 LAB — LACTATE DEHYDROGENASE: LDH: 178 U/L (ref 98–192)

## 2018-12-06 LAB — PROTIME-INR
INR: 2.2 — ABNORMAL HIGH (ref 0.8–1.2)
Prothrombin Time: 24.4 seconds — ABNORMAL HIGH (ref 11.4–15.2)

## 2018-12-21 ENCOUNTER — Other Ambulatory Visit (HOSPITAL_COMMUNITY): Payer: Self-pay | Admitting: Internal Medicine

## 2018-12-21 DIAGNOSIS — I255 Ischemic cardiomyopathy: Secondary | ICD-10-CM

## 2018-12-22 ENCOUNTER — Other Ambulatory Visit (HOSPITAL_COMMUNITY): Payer: Self-pay | Admitting: *Deleted

## 2018-12-22 DIAGNOSIS — Z95811 Presence of heart assist device: Secondary | ICD-10-CM

## 2018-12-22 DIAGNOSIS — Z7901 Long term (current) use of anticoagulants: Secondary | ICD-10-CM

## 2018-12-26 ENCOUNTER — Other Ambulatory Visit: Payer: Self-pay

## 2018-12-26 ENCOUNTER — Ambulatory Visit (HOSPITAL_COMMUNITY)
Admission: RE | Admit: 2018-12-26 | Discharge: 2018-12-26 | Disposition: A | Discharge status: Home / Self Care | Payer: Medicare Other | Source: Ambulatory Visit | Attending: Cardiology | Admitting: Cardiology

## 2018-12-26 ENCOUNTER — Ambulatory Visit (HOSPITAL_COMMUNITY): Payer: Self-pay | Admitting: Pharmacist

## 2018-12-26 ENCOUNTER — Encounter (HOSPITAL_COMMUNITY): Payer: Medicare Other

## 2018-12-26 DIAGNOSIS — Z95811 Presence of heart assist device: Secondary | ICD-10-CM | POA: Diagnosis not present

## 2018-12-26 DIAGNOSIS — Z7901 Long term (current) use of anticoagulants: Secondary | ICD-10-CM | POA: Diagnosis not present

## 2018-12-26 LAB — CBC
HCT: 45.2 % (ref 39.0–52.0)
Hemoglobin: 14.6 g/dL (ref 13.0–17.0)
MCH: 29.9 pg (ref 26.0–34.0)
MCHC: 32.3 g/dL (ref 30.0–36.0)
MCV: 92.4 fL (ref 80.0–100.0)
Platelets: 135 10*3/uL — ABNORMAL LOW (ref 150–400)
RBC: 4.89 MIL/uL (ref 4.22–5.81)
RDW: 14.5 % (ref 11.5–15.5)
WBC: 5.4 10*3/uL (ref 4.0–10.5)
nRBC: 0 % (ref 0.0–0.2)

## 2018-12-26 LAB — BASIC METABOLIC PANEL
Anion gap: 8 (ref 5–15)
BUN: 20 mg/dL (ref 8–23)
CO2: 26 mmol/L (ref 22–32)
Calcium: 9.2 mg/dL (ref 8.9–10.3)
Chloride: 103 mmol/L (ref 98–111)
Creatinine, Ser: 1.45 mg/dL — ABNORMAL HIGH (ref 0.61–1.24)
GFR calc Af Amer: 54 mL/min — ABNORMAL LOW (ref >60)
GFR calc non Af Amer: 47 mL/min — ABNORMAL LOW (ref >60)
Glucose, Bld: 82 mg/dL (ref 70–99)
Potassium: 4.1 mmol/L (ref 3.5–5.1)
Sodium: 137 mmol/L (ref 135–145)

## 2018-12-26 LAB — PROTIME-INR
INR: 2.3 — ABNORMAL HIGH (ref 0.8–1.2)
Prothrombin Time: 24.9 seconds — ABNORMAL HIGH (ref 11.4–15.2)

## 2018-12-26 LAB — LACTATE DEHYDROGENASE: LDH: 179 U/L (ref 98–192)

## 2018-12-28 ENCOUNTER — Other Ambulatory Visit: Payer: Self-pay

## 2018-12-28 ENCOUNTER — Ambulatory Visit (HOSPITAL_COMMUNITY)
Admission: RE | Admit: 2018-12-28 | Discharge: 2018-12-28 | Disposition: A | Discharge status: Home / Self Care | Payer: Medicare Other | Source: Ambulatory Visit | Attending: Internal Medicine | Admitting: Internal Medicine

## 2018-12-28 DIAGNOSIS — Z95811 Presence of heart assist device: Secondary | ICD-10-CM | POA: Diagnosis not present

## 2018-12-28 DIAGNOSIS — I5022 Chronic systolic (congestive) heart failure: Secondary | ICD-10-CM

## 2018-12-28 DIAGNOSIS — I2510 Atherosclerotic heart disease of native coronary artery without angina pectoris: Secondary | ICD-10-CM

## 2018-12-28 DIAGNOSIS — Z7901 Long term (current) use of anticoagulants: Secondary | ICD-10-CM

## 2018-12-28 NOTE — Progress Notes (Signed)
Heart Failure TeleHealth Note  Due to national recommendations of social distancing due to [place] 19, telehealth visit is felt to be most appropriate for this patient at this time.  I discussed the limitations, risks, security and privacy concerns of performing an evaluation and management service by telephone and the availability of in person appointments. I also discussed with the patient that there may be a patient responsible charge related to this service. The patient expressed understanding and agreed to proceed.   ID:  Johnny Oneill, Johnny Oneill 04/21/1941, MRN [id number]  Location: Home  Provider location: [address], [place] Type of Visit: Established patient   PCP:  Lavone Orn, MD  Cardiologist:  No primary care provider on file. Primary HF: Dr Haroldine Laws  Chief Complaint: VAD follow up   History of Present Illness: Johnny Oneill is a 78 y.o. 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   Admitted 3/4-3/25/15 with SOB and syncopal episode. Found to have SBO and NG placed and started on TPN. On 11/06/13 developed progressive SOB and hypotension and co-ox 51% and Hgb 6.7. Started on milrinone and transfused. Echo showed nl RV function. Patient had PEA arrest and was intubated and started on pressors. CXR revealed a R hemothorax and a R CT placed. He required VATs and evacuation of hemothorax and was then teansferred to CIR.   LVAD Issues  Elevated LDH--bivalirudin. D/C LDH 188  Patient presents via audio conferencing for a telehealth visit today. Overall doing fine. 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 No cough, fever, or chills. No sick contacts. No bleeding. No SOB other than after walking 2 miles. No edema, orthopnea, or PND. No CP. Has some dizziness when he stands too quickly, has improved with taking spiro qHS. Has not needed PRN lasix. Has a  very small amount of crusty brown drainage from driveline, says this is normal for him. No redness, odor, or pain. Doing daily dressing changes right now because of the heat. No rash currently. Appetite is great. No VAD alarms. Taking all medications every day. Weight 164 lbs. Labs drawn on Monday and were stable. INR 2.3 and coumadin was adjusted by PharmD. BP 93/76 on wife check this am.  VAD numbers per patient: Speed: 9200 Flow:4.7 Power: 5.0 PI:7.2  Echo 3/18 EF 10-15%. RV moderate HK. LVAD cannula ok. Mild AI  Echo 9/16 EF 20%, LVAD cannula ok moderate RV dilation with mild to moderately decreased RV systolic function.   Pt denies symptoms of cough, fevers, chills, or new SOB worrisome for COVID 19.    Past Medical History:  Diagnosis Date  . Anxiety   . Automatic implantable cardioverter-defibrillator in situ   . CAD (coronary artery disease)    S/P stenting of LCX in 2004;  s/p Lat MI 2009 with occlusion of the LCX - treated with Promus stenting. Has total occlusion of the RCA.   [name] CHF (congestive heart failure) ([place])   . Hypercholesteremia   . Hypertension   . Hypothyroidism   . ICD (implantable cardiac defibrillator) in place    PROPHYLACTIC      medtronic  . Inferior MI ([place]) "? date"; 2009  . Ischemic cardiomyopathy    a. 10/09 Echo: Sev LV Dysfxn, inf/lat AK, Mod MR.  [name] LVAD (left ventricular assist device) present ([place])   . Osteoarthritis  a. s/p R TKR  . Pacemaker   . PVC (premature ventricular contraction)   . RBBB   . Shortness of breath    Past Surgical History:  Procedure Laterality Date  . CARPAL TUNNEL RELEASE Right 05/29/2016   Procedure: CARPAL TUNNEL RELEASE;  Surgeon: Earlie Server, MD;  Location: [facility];  Service: Orthopedics;  Laterality: Right;  . CORONARY ANGIOPLASTY WITH STENT PLACEMENT  2004  . CORONARY ANGIOPLASTY WITH STENT PLACEMENT  07/2008  . DEFIBRILLATOR  2009  . ESOPHAGOGASTRODUODENOSCOPY N/A 09/15/2013   Procedure:  ESOPHAGOGASTRODUODENOSCOPY (EGD);  Surgeon: Ladene Artist, MD;  Location[facility] ENDOSCOPY;  Service: Endoscopy;  Laterality: N/A;  bedside  . HEMATOMA EVACUATION Right 11/06/2013   Procedure: EVACUATION HEMATOMA;  Surgeon: Gaye Pollack, MD;  Location: [facility];  Service: Cardiothoracic;  Laterality: Right;  . INSERT / REPLACE / REMOVE PACEMAKER  2009  . INSERTION OF IMPLANTABLE LEFT VENTRICULAR ASSIST DEVICE N/A 09/18/2013   Procedure: INSERTION OF IMPLANTABLE LEFT VENTRICULAR ASSIST DEVICE;  Surgeon: Ivin Poot, MD;  Location: [facility];  Service: Open Heart Surgery;  Laterality: N/A;  CIRC ARREST  NITRIC OXIDE  . INTRA-AORTIC BALLOON PUMP INSERTION N/A 09/14/2013   Procedure: INTRA-AORTIC BALLOON PUMP INSERTION;  Surgeon: Jolaine Artist, MD;  Location: [facility] CATH LAB;  Service: Cardiovascular;  Laterality: N/A;  . INTRAOPERATIVE TRANSESOPHAGEAL ECHOCARDIOGRAM N/A 09/18/2013   Procedure: INTRAOPERATIVE TRANSESOPHAGEAL ECHOCARDIOGRAM;  Surgeon: Ivin Poot, MD;  Location: [facility];  Service: Open Heart Surgery;  Laterality: N/A;  . KNEE ARTHROSCOPY     bilaterally  . KNEE ARTHROSCOPY W/ PARTIAL MEDIAL MENISCECTOMY  12/2002   left  . LEFT VENTRICULAR ASSIST DEVICE    . RIGHT HEART CATHETERIZATION N/A 08/25/2013   Procedure: RIGHT HEART CATH;  Surgeon: Jolaine Artist, MD;  Location: [facility] CATH LAB;  Service: Cardiovascular;  Laterality: N/A;  . RIGHT HEART CATHETERIZATION N/A 09/13/2013   Procedure: RIGHT HEART CATH;  Surgeon: Jolaine Artist, MD;  Location: [facility] CATH LAB;  Service: Cardiovascular;  Laterality: N/A;  . RIGHT HEART CATHETERIZATION N/A 02/08/2014   Procedure: RIGHT HEART CATH;  Surgeon: Jolaine Artist, MD;  Location: [facility] CATH LAB;  Service: Cardiovascular;  Laterality: N/A;  . RIGHT HEART CATHETERIZATION N/A 02/12/2014   Procedure: RIGHT HEART CATH;  Surgeon: Jolaine Artist, MD;  Location: [facility] CATH LAB;  Service: Cardiovascular;  Laterality: N/A;  . TOTAL KNEE ARTHROPLASTY   11/27/11   left  . TOTAL KNEE ARTHROPLASTY  11/27/2011   Procedure: TOTAL KNEE ARTHROPLASTY;  Surgeon: Yvette Rack., MD;  Location: [facility];  Service: Orthopedics;  Laterality: Left;  left total knee arthroplasty  . VIDEO ASSISTED THORACOSCOPY Right 11/06/2013   Procedure: VIDEO ASSISTED THORACOSCOPY;  Surgeon: Gaye Pollack, MD;  Location: [facility] OR;  Service: Cardiothoracic;  Laterality: Right;     Current Outpatient Medications  Medication Sig Dispense Refill  . aspirin EC 325 MG tablet Take 325 mg by mouth daily.    . citalopram (CELEXA) 10 MG tablet Take 1 tablet by mouth once daily 90 tablet 0  . Coenzyme Q10 (COQ10) 200 MG CAPS Take 200 mg by mouth daily.    . famotidine (PEPCID) 20 MG tablet Take 20 mg by mouth 2 (two) times daily.    . hydrALAZINE (APRESOLINE) 50 MG tablet TAKE 1 TABLET BY MOUTH THREE TIMES DAILY 270 tablet 0  . levothyroxine (SYNTHROID) 137 MCG tablet Take 1 tablet (137 mcg total) by mouth daily before breakfast. 90 tablet  2  . Melatonin 3 MG TABS Take 1 tablet by mouth at bedtime.    . pantoprazole (PROTONIX) 40 MG tablet TAKE 1 TABLET BY MOUTH ONCE DAILY 90 tablet 1  . Probiotic Product (PROBIOTIC PO) Take 1 capsule by mouth daily.    . rosuvastatin (CRESTOR) 20 MG tablet Take 1 tablet (20 mg total) by mouth daily. 90 tablet 0  . spironolactone (ALDACTONE) 25 MG tablet Take 1 tablet (25 mg total) by mouth daily. Take in the evening 30 tablet 3  . traZODone (DESYREL) 50 MG tablet Take 1 tablet (50 mg total) by mouth at bedtime. (Patient taking differently: Take 50 mg by mouth at bedtime as needed. ) 30 tablet 5  . warfarin (COUMADIN) 5 MG tablet Take 2 tablets (10 mg) daily except 1 tablet (5 mg) on Monday/Friday    . acetaminophen (TYLENOL) 500 MG chewable tablet Chew 500 mg by mouth every 6 (six) hours as needed for pain.    . furosemide (LASIX) 20 MG tablet Take 20 mg by mouth daily as needed (Take 20 mg as daily as needed for wt > 153 lbs). Reported on 01/14/2016     . potassium chloride SA (K-DUR,KLOR-CON) 20 MEQ tablet Take 1 tablet (20 mEq total) by mouth daily as needed. Take one potassium pill when you take your lasix. (Patient not taking: Reported on 07/19/2018) 90 tablet 3   No current facility-administered medications for this encounter.    Facility-Administered Medications Ordered in Other Encounters  Medication Dose Route Frequency Provider Last Rate Last Dose  . etomidate (AMIDATE) injection    Anesthesia Intra-op Myna Bright, CRNA   20 mg at 02/08/14 0915  . rocuronium [facility]) injection    Anesthesia Intra-op Myna Bright, CRNA   50 mg at 02/08/14 0932  . succinylcholine (ANECTINE) injection    Anesthesia Intra-op Myna Bright, CRNA   100 mg at 02/08/14 0915    Allergies:   Ace inhibitors; Diovan [valsartan]; Lipitor [atorvastatin calcium]; and Norvasc [amlodipine]   Social History:  The patient  reports that he has never smoked. He has never used smokeless tobacco. He reports that he does not drink alcohol or use drugs.   Family History:  The patient's family history includes Coronary artery disease in his mother; Heart disease in his mother; Heart failure in his father; Hyperlipidemia in his sister; Stroke in his father.   ROS:  Please see the history of present illness.   All other systems are personally reviewed and negative.    Exam:  (Video/Tele Health Call; Exam is subjective and or/visual.) General:  Speaks in full sentences. No resp difficulty. Lungs: Normal respiratory effort with conversation.  Abdomen: No distension per patient report. Driveline dressing CDI.  Extremities: No edema.  Neuro: Alert & oriented x 3.   Recent Labs: 05/30/2018: TSH 0.94 09/20/2018: ALT 18 12/26/2018: BUN 20; Creatinine, Ser 1.45; Hemoglobin 14.6; Platelets 135; Potassium 4.1; Sodium 137  Personally reviewed   Wt Readings from Last 3 Encounters:  09/20/18 75.5 kg (166 lb 6.4 oz)  07/19/18 77.5 kg (170 lb 12.8 oz)  06/02/18  76.2 kg (168 lb)      ASSESSMENT AND PLAN:  1) Chronic systolic HF: ICM, EF 56-43% s/p LVAD HM II implant 08/2013 for DT.   - Echo 3/18 EF 10-15% mild AI. Mod RV dysfunction. LVAD cannula OK - NYHA II symptoms - Volume status sounds stable. Weight is stable.  - Continue lasix prn only. Has not needed recently. -  Continue spiro 25 mg daily.  - He has not tolerated ACE-I or amlodipine in the past. - ICD at [facility]. Has seen Dr. Rayann Heman in 2/18 and decided not to replace.   2) LVAD: HMII 08/2013 for DT. - No alarms per patient. Unable to interrogate today due to telehealth call.  - Admitted in May 2018 with elevated LDH with bival.  - LDH stable 179.  - Continue aspirin and coumadin. No bleeding.  - Hgb stable at 14.9 3) Chronic anticoagulation: INR goal 2.0-3.0.    - INR 2.3. Dosing per PharmD.  - Continue ASA 325 mg for LVAD + warfarin.  4) HTN:  - 93/76. SBP typically 80-90s. Unable to check MAP today, their doppler battery is low.  5) Hypothyroidism:  - Followed by Dr Dwyane Dee. 6) Hyperlipidemia:  - Continue Crestor. Zetia stopped due to cost. No change.  7) CAD - No s/s ischemia.  8) Driveline rash - Currently resolved. Requires diflucan occasionally, especially in the summer.  - I asked him to keep a close on eye on his driveline site. Discussed s/s infection.    COVID screen The patient does not have any symptoms that suggest any further testing/ screening at this time.  Social distancing reinforced today.  Patient Risk: After full review of this patients clinical status, I feel that they are at moderate risk for cardiac decompensation at this time.  Orders/Follow up: No medication changes today. Follow up in VAD clinic in 2 months  Today, I have spent 25 minutes with the patient with telehealth technology discussing the above issues.    Signed, Georgiana Shore, NP  12/28/2018 9:20 AM   [facility] [address] [facility] [place]  [place] [address] [phone] (office) [phone] (fax)

## 2018-12-30 ENCOUNTER — Telehealth (HOSPITAL_COMMUNITY): Payer: Self-pay | Admitting: Licensed Clinical Social Worker

## 2018-12-30 ENCOUNTER — Other Ambulatory Visit (HOSPITAL_COMMUNITY): Payer: Self-pay | Admitting: *Deleted

## 2018-12-30 DIAGNOSIS — B49 Unspecified mycosis: Secondary | ICD-10-CM

## 2018-12-30 DIAGNOSIS — Z95811 Presence of heart assist device: Secondary | ICD-10-CM

## 2018-12-30 MED ORDER — DOXYCYCLINE HYCLATE 100 MG PO CAPS: 100.0000 mg | ORAL_CAPSULE | Freq: Two times a day (BID) | ORAL | 0 refills | Status: AC

## 2018-12-30 NOTE — Telephone Encounter (Signed)
CSW contacted patient/caregiver to follow up on status with coronavirus and if anything needed. Patient instructed to stay home and use of proper hygiene and call if needed. CSW shared information about LVAD Support Group meeting virtually on May 4th and obtained email address if interested. Patient denies any concerns at this time and verbalizes understanding of process for contacting VAD Coordinators if needed. CSW continues to follow as needed. Jackie Theodoros Stjames, LCSW, CCSW-MCS [phone] 

## 2019-01-13 ENCOUNTER — Other Ambulatory Visit (HOSPITAL_COMMUNITY): Payer: Self-pay | Admitting: Unknown Physician Specialty

## 2019-01-13 DIAGNOSIS — Z95811 Presence of heart assist device: Secondary | ICD-10-CM

## 2019-01-13 DIAGNOSIS — Z7901 Long term (current) use of anticoagulants: Secondary | ICD-10-CM

## 2019-01-16 ENCOUNTER — Telehealth: Payer: Self-pay | Admitting: Licensed Clinical Social Worker

## 2019-01-16 ENCOUNTER — Other Ambulatory Visit: Payer: Self-pay

## 2019-01-16 ENCOUNTER — Ambulatory Visit (HOSPITAL_COMMUNITY)
Admission: RE | Admit: 2019-01-16 | Discharge: 2019-01-16 | Disposition: A | Discharge status: Home / Self Care | Payer: Medicare Other | Source: Ambulatory Visit | Attending: Internal Medicine | Admitting: Internal Medicine

## 2019-01-16 ENCOUNTER — Ambulatory Visit (HOSPITAL_COMMUNITY): Payer: Self-pay | Admitting: Pharmacist

## 2019-01-16 DIAGNOSIS — Z95811 Presence of heart assist device: Secondary | ICD-10-CM | POA: Diagnosis not present

## 2019-01-16 DIAGNOSIS — Z7901 Long term (current) use of anticoagulants: Secondary | ICD-10-CM | POA: Insufficient documentation

## 2019-01-16 LAB — PROTIME-INR
INR: 3.4 — ABNORMAL HIGH (ref 0.8–1.2)
Prothrombin Time: 33.8 seconds — ABNORMAL HIGH (ref 11.4–15.2)

## 2019-01-16 LAB — LACTATE DEHYDROGENASE: LDH: 170 U/L (ref 98–192)

## 2019-01-16 NOTE — Telephone Encounter (Signed)
CSW contacted patient/caregiver to follow up on status with coronavirus and if anything needed. Patient instructed to stay home and use of proper hygiene and call if needed. CSW shared information about LVAD Support Group meeting virtually today if interested. Patient denies any concerns at this time and verbalizes understanding of process for contacting VAD Coordinators if needed. CSW continues to follow as needed. Raquel Sarna, [place], [place]

## 2019-01-27 ENCOUNTER — Other Ambulatory Visit (HOSPITAL_COMMUNITY): Payer: Self-pay | Admitting: Unknown Physician Specialty

## 2019-01-27 DIAGNOSIS — Z95811 Presence of heart assist device: Secondary | ICD-10-CM

## 2019-01-27 DIAGNOSIS — Z7901 Long term (current) use of anticoagulants: Secondary | ICD-10-CM

## 2019-01-30 ENCOUNTER — Telehealth (HOSPITAL_COMMUNITY): Payer: Self-pay | Admitting: Licensed Clinical Social Worker

## 2019-01-30 ENCOUNTER — Other Ambulatory Visit: Payer: Self-pay

## 2019-01-30 ENCOUNTER — Ambulatory Visit (HOSPITAL_COMMUNITY)
Admission: RE | Admit: 2019-01-30 | Discharge: 2019-01-30 | Disposition: A | Discharge status: Home / Self Care | Payer: Medicare Other | Source: Ambulatory Visit | Attending: Internal Medicine | Admitting: Internal Medicine

## 2019-01-30 ENCOUNTER — Ambulatory Visit (HOSPITAL_COMMUNITY): Payer: Self-pay | Admitting: Pharmacist

## 2019-01-30 DIAGNOSIS — Z95811 Presence of heart assist device: Secondary | ICD-10-CM | POA: Diagnosis not present

## 2019-01-30 DIAGNOSIS — Z7901 Long term (current) use of anticoagulants: Secondary | ICD-10-CM | POA: Diagnosis not present

## 2019-01-30 LAB — LACTATE DEHYDROGENASE: LDH: 171 U/L (ref 98–192)

## 2019-01-30 LAB — PROTIME-INR
INR: 2.5 — ABNORMAL HIGH (ref 0.8–1.2)
Prothrombin Time: 26.7 seconds — ABNORMAL HIGH (ref 11.4–15.2)

## 2019-01-30 NOTE — Telephone Encounter (Signed)
CSW contacted patient/caregiver to follow up on status with coronavirus and if anything needed. Patient instructed to stay home and use of proper hygiene and call if needed. CSW shared information about LVAD Support Group meeting virtually today if interested. Patient denies any concerns at this time and verbalizes understanding of process for contacting VAD Coordinators if needed. CSW continues to follow as needed. Johnny Oneill, [place], [place]

## 2019-02-16 ENCOUNTER — Other Ambulatory Visit (HOSPITAL_COMMUNITY): Payer: Self-pay | Admitting: Unknown Physician Specialty

## 2019-02-16 DIAGNOSIS — Z95811 Presence of heart assist device: Secondary | ICD-10-CM

## 2019-02-16 DIAGNOSIS — Z7901 Long term (current) use of anticoagulants: Secondary | ICD-10-CM

## 2019-02-20 ENCOUNTER — Ambulatory Visit (HOSPITAL_COMMUNITY): Payer: Self-pay | Admitting: Pharmacist

## 2019-02-20 ENCOUNTER — Other Ambulatory Visit (HOSPITAL_COMMUNITY): Payer: Self-pay | Admitting: Internal Medicine

## 2019-02-20 ENCOUNTER — Ambulatory Visit (HOSPITAL_COMMUNITY)
Admission: RE | Admit: 2019-02-20 | Discharge: 2019-02-20 | Disposition: A | Discharge status: Home / Self Care | Payer: Medicare Other | Source: Ambulatory Visit | Attending: Cardiology | Admitting: Cardiology

## 2019-02-20 ENCOUNTER — Other Ambulatory Visit (HOSPITAL_COMMUNITY): Payer: Self-pay | Admitting: Adult Health

## 2019-02-20 ENCOUNTER — Other Ambulatory Visit: Payer: Self-pay

## 2019-02-20 DIAGNOSIS — Z95811 Presence of heart assist device: Secondary | ICD-10-CM | POA: Diagnosis not present

## 2019-02-20 DIAGNOSIS — I10 Essential (primary) hypertension: Secondary | ICD-10-CM

## 2019-02-20 DIAGNOSIS — Z7901 Long term (current) use of anticoagulants: Secondary | ICD-10-CM | POA: Diagnosis not present

## 2019-02-20 DIAGNOSIS — K219 Gastro-esophageal reflux disease without esophagitis: Secondary | ICD-10-CM

## 2019-02-20 LAB — LACTATE DEHYDROGENASE: LDH: 218 U/L — ABNORMAL HIGH (ref 98–192)

## 2019-02-20 LAB — PROTIME-INR
INR: 2.4 — ABNORMAL HIGH (ref 0.8–1.2)
Prothrombin Time: 25.8 seconds — ABNORMAL HIGH (ref 11.4–15.2)

## 2019-02-26 ENCOUNTER — Other Ambulatory Visit (HOSPITAL_COMMUNITY): Payer: Self-pay | Admitting: Internal Medicine

## 2019-02-26 DIAGNOSIS — E785 Hyperlipidemia, unspecified: Secondary | ICD-10-CM

## 2019-02-26 DIAGNOSIS — I5043 Acute on chronic combined systolic (congestive) and diastolic (congestive) heart failure: Secondary | ICD-10-CM

## 2019-02-28 ENCOUNTER — Telehealth (HOSPITAL_COMMUNITY): Payer: Self-pay | Admitting: Internal Medicine

## 2019-02-28 ENCOUNTER — Other Ambulatory Visit (HOSPITAL_COMMUNITY): Payer: Self-pay | Admitting: *Deleted

## 2019-02-28 DIAGNOSIS — Z7901 Long term (current) use of anticoagulants: Secondary | ICD-10-CM

## 2019-02-28 DIAGNOSIS — Z95811 Presence of heart assist device: Secondary | ICD-10-CM

## 2019-02-28 NOTE — Telephone Encounter (Signed)
COVID-19 pre-appointment screening questions:   Do you have a history of COVID-19 or a positive test result in the past 7-10 days? NO  To the best of your knowledge, have you been in close contact with anyone with a confirmed diagnosis of COVID 19? NO  Have you had any one or more of the following: Fever, chills, cough, shortness of breath (out of the normal for you) or any flu-like symptoms? NO  Are you experiencing any of the following symptoms that is new or out of usual for you:   Ear, nose or throat discomfort  Sore throat  Headache  Muscle Pain  Diarrhea  Loss of taste or smell   Reviewed all the following with patient: YES  Use of hand sanitizer when entering the building  Everyone is required to wear a mask in the building, if you do not have a mask we are happy to provide you with one when you arrive  NO Visitor guidelines   If patient answers YES to any of questions they must change to a virtual visit and place note in comments about symptoms

## 2019-03-01 ENCOUNTER — Encounter (HOSPITAL_COMMUNITY): Payer: Medicare Other

## 2019-03-01 ENCOUNTER — Ambulatory Visit (HOSPITAL_COMMUNITY)
Admission: RE | Admit: 2019-03-01 | Discharge: 2019-03-01 | Disposition: A | Discharge status: Home / Self Care | Payer: Medicare Other | Source: Ambulatory Visit | Attending: Cardiology | Admitting: Cardiology

## 2019-03-01 ENCOUNTER — Ambulatory Visit (HOSPITAL_COMMUNITY): Payer: Self-pay | Admitting: Pharmacist

## 2019-03-01 ENCOUNTER — Other Ambulatory Visit: Payer: Self-pay

## 2019-03-01 DIAGNOSIS — Z7901 Long term (current) use of anticoagulants: Secondary | ICD-10-CM | POA: Insufficient documentation

## 2019-03-01 DIAGNOSIS — Z95811 Presence of heart assist device: Secondary | ICD-10-CM | POA: Diagnosis not present

## 2019-03-01 LAB — BASIC METABOLIC PANEL
Anion gap: 9 (ref 5–15)
BUN: 25 mg/dL — ABNORMAL HIGH (ref 8–23)
CO2: 25 mmol/L (ref 22–32)
Calcium: 9.4 mg/dL (ref 8.9–10.3)
Chloride: 103 mmol/L (ref 98–111)
Creatinine, Ser: 1.67 mg/dL — ABNORMAL HIGH (ref 0.61–1.24)
GFR calc Af Amer: 46 mL/min — ABNORMAL LOW (ref >60)
GFR calc non Af Amer: 39 mL/min — ABNORMAL LOW (ref >60)
Glucose, Bld: 91 mg/dL (ref 70–99)
Potassium: 3.9 mmol/L (ref 3.5–5.1)
Sodium: 137 mmol/L (ref 135–145)

## 2019-03-01 LAB — CBC
HCT: 44.8 % (ref 39.0–52.0)
Hemoglobin: 14.3 g/dL (ref 13.0–17.0)
MCH: 29.2 pg (ref 26.0–34.0)
MCHC: 31.9 g/dL (ref 30.0–36.0)
MCV: 91.4 fL (ref 80.0–100.0)
Platelets: 152 10*3/uL (ref 150–400)
RBC: 4.9 MIL/uL (ref 4.22–5.81)
RDW: 14.4 % (ref 11.5–15.5)
WBC: 5.7 10*3/uL (ref 4.0–10.5)
nRBC: 0 % (ref 0.0–0.2)

## 2019-03-01 LAB — LACTATE DEHYDROGENASE: LDH: 172 U/L (ref 98–192)

## 2019-03-01 LAB — PROTIME-INR
INR: 2.8 — ABNORMAL HIGH (ref 0.8–1.2)
Prothrombin Time: 29.2 seconds — ABNORMAL HIGH (ref 11.4–15.2)

## 2019-03-02 ENCOUNTER — Telehealth (HOSPITAL_COMMUNITY): Payer: Self-pay | Admitting: Licensed Clinical Social Worker

## 2019-03-02 NOTE — Telephone Encounter (Signed)
CSW contacted patient/caregiver to follow up on status with coronavirus and if anything needed. Patient instructed to stay home and use of proper hygiene and call if needed. CSW shared information about LVAD Support Group meeting virtually Monday July 6th if interested. Patient denies any concerns at this time and verbalizes understanding of process for contacting VAD Coordinators if needed. CSW continues to follow as needed. Jackie Charlei Ramsaran, LCSW, CCSW-MCS [phone] 

## 2019-03-20 ENCOUNTER — Other Ambulatory Visit (HOSPITAL_COMMUNITY): Payer: Self-pay | Admitting: Internal Medicine

## 2019-03-20 DIAGNOSIS — I255 Ischemic cardiomyopathy: Secondary | ICD-10-CM

## 2019-03-21 ENCOUNTER — Other Ambulatory Visit (HOSPITAL_COMMUNITY): Payer: Self-pay | Admitting: *Deleted

## 2019-03-21 DIAGNOSIS — Z7901 Long term (current) use of anticoagulants: Secondary | ICD-10-CM

## 2019-03-21 DIAGNOSIS — Z95811 Presence of heart assist device: Secondary | ICD-10-CM

## 2019-03-22 ENCOUNTER — Ambulatory Visit (HOSPITAL_COMMUNITY): Payer: Self-pay | Admitting: Pharmacist

## 2019-03-22 ENCOUNTER — Other Ambulatory Visit: Payer: Self-pay

## 2019-03-22 ENCOUNTER — Ambulatory Visit (HOSPITAL_COMMUNITY)
Admission: RE | Admit: 2019-03-22 | Discharge: 2019-03-22 | Disposition: A | Discharge status: Home / Self Care | Payer: Medicare Other | Source: Ambulatory Visit | Attending: Cardiology | Admitting: Cardiology

## 2019-03-22 ENCOUNTER — Encounter (HOSPITAL_COMMUNITY): Payer: Self-pay

## 2019-03-22 VITALS — BP 88/56 | HR 71 | Temp 98.3°F | Ht 69.0 in | Wt 161.0 lb

## 2019-03-22 DIAGNOSIS — Z7982 Long term (current) use of aspirin: Secondary | ICD-10-CM | POA: Diagnosis not present

## 2019-03-22 DIAGNOSIS — E7800 Pure hypercholesterolemia, unspecified: Secondary | ICD-10-CM | POA: Diagnosis not present

## 2019-03-22 DIAGNOSIS — I2510 Atherosclerotic heart disease of native coronary artery without angina pectoris: Secondary | ICD-10-CM | POA: Insufficient documentation

## 2019-03-22 DIAGNOSIS — I252 Old myocardial infarction: Secondary | ICD-10-CM | POA: Diagnosis not present

## 2019-03-22 DIAGNOSIS — M1990 Unspecified osteoarthritis, unspecified site: Secondary | ICD-10-CM | POA: Insufficient documentation

## 2019-03-22 DIAGNOSIS — R740 Nonspecific elevation of levels of transaminase and lactic acid dehydrogenase [LDH]: Secondary | ICD-10-CM | POA: Diagnosis not present

## 2019-03-22 DIAGNOSIS — Z79899 Other long term (current) drug therapy: Secondary | ICD-10-CM | POA: Insufficient documentation

## 2019-03-22 DIAGNOSIS — I5022 Chronic systolic (congestive) heart failure: Secondary | ICD-10-CM | POA: Diagnosis not present

## 2019-03-22 DIAGNOSIS — Z7901 Long term (current) use of anticoagulants: Secondary | ICD-10-CM | POA: Insufficient documentation

## 2019-03-22 DIAGNOSIS — F419 Anxiety disorder, unspecified: Secondary | ICD-10-CM | POA: Insufficient documentation

## 2019-03-22 DIAGNOSIS — I469 Cardiac arrest, cause unspecified: Secondary | ICD-10-CM | POA: Insufficient documentation

## 2019-03-22 DIAGNOSIS — I110 Hypertensive heart disease with heart failure: Secondary | ICD-10-CM | POA: Insufficient documentation

## 2019-03-22 DIAGNOSIS — E785 Hyperlipidemia, unspecified: Secondary | ICD-10-CM | POA: Insufficient documentation

## 2019-03-22 DIAGNOSIS — I4510 Unspecified right bundle-branch block: Secondary | ICD-10-CM | POA: Diagnosis not present

## 2019-03-22 DIAGNOSIS — I255 Ischemic cardiomyopathy: Secondary | ICD-10-CM | POA: Insufficient documentation

## 2019-03-22 DIAGNOSIS — E039 Hypothyroidism, unspecified: Secondary | ICD-10-CM | POA: Diagnosis not present

## 2019-03-22 DIAGNOSIS — Z95811 Presence of heart assist device: Secondary | ICD-10-CM | POA: Diagnosis not present

## 2019-03-22 DIAGNOSIS — Z950 Presence of cardiac pacemaker: Secondary | ICD-10-CM | POA: Insufficient documentation

## 2019-03-22 DIAGNOSIS — I10 Essential (primary) hypertension: Secondary | ICD-10-CM | POA: Diagnosis not present

## 2019-03-22 LAB — COMPREHENSIVE METABOLIC PANEL
ALT: 21 U/L (ref 0–44)
AST: 29 U/L (ref 15–41)
Albumin: 4.5 g/dL (ref 3.5–5.0)
Alkaline Phosphatase: 78 U/L (ref 38–126)
Anion gap: 9 (ref 5–15)
BUN: 21 mg/dL (ref 8–23)
CO2: 25 mmol/L (ref 22–32)
Calcium: 9.3 mg/dL (ref 8.9–10.3)
Chloride: 105 mmol/L (ref 98–111)
Creatinine, Ser: 1.65 mg/dL — ABNORMAL HIGH (ref 0.61–1.24)
GFR calc Af Amer: 46 mL/min — ABNORMAL LOW (ref >60)
GFR calc non Af Amer: 40 mL/min — ABNORMAL LOW (ref >60)
Glucose, Bld: 84 mg/dL (ref 70–99)
Potassium: 4.2 mmol/L (ref 3.5–5.1)
Sodium: 139 mmol/L (ref 135–145)
Total Bilirubin: 0.8 mg/dL (ref 0.3–1.2)
Total Protein: 7.6 g/dL (ref 6.5–8.1)

## 2019-03-22 LAB — CBC
HCT: 50.5 % (ref 39.0–52.0)
Hemoglobin: 15.8 g/dL (ref 13.0–17.0)
MCH: 29.6 pg (ref 26.0–34.0)
MCHC: 31.3 g/dL (ref 30.0–36.0)
MCV: 94.6 fL (ref 80.0–100.0)
Platelets: 164 10*3/uL (ref 150–400)
RBC: 5.34 MIL/uL (ref 4.22–5.81)
RDW: 14.5 % (ref 11.5–15.5)
WBC: 5.1 10*3/uL (ref 4.0–10.5)
nRBC: 0 % (ref 0.0–0.2)

## 2019-03-22 LAB — PREALBUMIN: Prealbumin: 23.6 mg/dL (ref 18–38)

## 2019-03-22 LAB — LACTATE DEHYDROGENASE: LDH: 264 U/L — ABNORMAL HIGH (ref 98–192)

## 2019-03-22 LAB — PROTIME-INR
INR: 2.5 — ABNORMAL HIGH (ref 0.8–1.2)
Prothrombin Time: 26.8 seconds — ABNORMAL HIGH (ref 11.4–15.2)

## 2019-03-22 NOTE — Progress Notes (Addendum)
Patient presents for 2 month follow up in [facility] today. Reports no problems with VAD equipment or concerns with drive line.   Vital Signs:  Temp: 5'9" Doppler Pressure: 80 Automatc BP:  88/56 (72) HR:  71 SPO2: 98% on RA  Weight: 161 lb w/o eqt Last weight: 166.4 lb  VAD Indication: Destination therapy- patient choice    VAD interrogation & Equipment Management: Speed: 9200 Flow:  4.1 Power: 4.9 w    PI:  5.6  Alarms: none Events: 05- 10 PI events daily  Fixed speed 9200  Low speed limit: 8600  Primary Controller:  Replace back up battery in 75months Back up controller:  Did not bring back up bag  Annual Equipment Maintenance on UBC/PM was performed on 08/2017  I reviewed the LVAD parameters from today and compared the results to the patient's prior recorded data. LVAD interrogation was NEGATIVE for significant power changes, NEGATIVE for clinical alarms and STABLE for PI events/speed drops. No programming changes were made and pump is functioning within specified parameters. Pt is performing daily controller and system monitor self tests along with completing weekly and monthly maintenance for LVAD equipment.  Exit Site Care: Drive line is being maintained every three days using gauze dressing during heat of summer by [name], his wife. Gauze dressing dry and intact. Stabilization device present and accurately applied. Pt denies fever or chills. Pt has CHG allergy. Pt provided with 8 daily dressing kits and 5 extra anchors.  Significant Events on VAD Support:  10/2013>SBO 12/2016> elevated LDH, admit for Bival  Device:Medtronic ERI Therapies: off Last check:  09/23/17  BP & Labs:  Doppler BP 80 - Doppler is reflecting modified systolic  Hgb  - No S/S of bleeding. Specifically denies melena/BRBPR or nosebleeds.  LDH  with established baseline of 200- 250. Denies tea-colored urine. No power elevations noted on interrogation.   5.5 yr Intermacs follow up  completed including:  Quality of Life, KCCQ-12, and Neurocognitive trail making.   Pt unable to complete 6 min walk due to knee pain.   Back up controller: pt did not bring to clinic. Asked pt to bring to next lab visit for battery check and charge. Pt verbalized agreement to same.    Patient Instructions:  1. No change in medications. 2. Bring your black bag to next lab visit for battery check and charge.  3.  Return to clinic in 2 months  Drew Dale Coordinator   Office: [phone] 24/7 Emergency [place] Pager: [phone]

## 2019-03-22 NOTE — Progress Notes (Signed)
VAD CLINIC NOTE   HPI:  Johnny Oneill is a 78 y.o. 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   Admitted 3/4-3/25/15 with SOB and syncopal episode. Found to have SBO and NG placed and started on TPN. On 11/06/13 developed progressive SOB and hypotension and co-ox 51% and Hgb 6.7. Started on milrinone and transfused. Echo showed nl RV function. Patient had PEA arrest and was intubated and started on pressors. CXR revealed a R hemothorax and a R CT placed. He required VATs and evacuation of hemothorax and was then teansferred to CIR.   LVAD Issues  Elevated LDH--bivalirudin. D/C LDH 188  He presents today for regular follow up. He is doing great overall 5.5 years post LVAD. Still with some arthritis pain but no other complaints. Not as active with sports reffing and church duties due to [place] but still stays busy. Denies orthopnea or PND. No fevers, chills or problems with driveline. No bleeding, melena or neuro symptoms. No VAD alarms. Taking all meds as prescribed.    Echo 3/18 EF 10-15%. RV moderate HK. LVAD cannula ok. Mild AI  Echo 9/16 EF 20%, LVAD cannula ok moderate RV dilation with mild to moderately decreased RV systolic function.   VAD Indication: Destination therapy- patient choice    VAD interrogation & Equipment Management: Speed: 9200 Flow:  4.1 Power: 4.9 w PI:  5.6  Alarms: none Events: 05- 10 PI events daily  Fixed speed 9200  Low speed limit: 8600  Primary Controller: Replace back up battery in 41months Back up controller: Did not bring back up bag  Annual Equipment Maintenance on UBC/PM was performed on 08/2017  Past Medical History:  Diagnosis Date  . Anxiety   . Automatic implantable cardioverter-defibrillator in situ   . CAD (coronary artery disease)    S/P stenting of LCX in 2004;  s/p Lat MI 2009 with occlusion of the LCX - treated with Promus stenting.  Has total occlusion of the RCA.   [name] CHF (congestive heart failure) ([place])   . Hypercholesteremia   . Hypertension   . Hypothyroidism   . ICD (implantable cardiac defibrillator) in place    PROPHYLACTIC      medtronic  . Inferior MI ([place]) "? date"; 2009  . Ischemic cardiomyopathy    a. 10/09 Echo: Sev LV Dysfxn, inf/lat AK, Mod MR.  [name] LVAD (left ventricular assist device) present ([place])   . Osteoarthritis    a. s/p R TKR  . Pacemaker   . PVC (premature ventricular contraction)   . RBBB   . Shortness of breath     Current Outpatient Medications  Medication Sig Dispense Refill  . acetaminophen (TYLENOL) 500 MG chewable tablet Chew 500 mg by mouth every 6 (six) hours as needed for pain.    [name] aspirin EC 325 MG tablet Take 325 mg by mouth daily.    . citalopram (CELEXA) 10 MG tablet Take 1 tablet by mouth once daily 90 tablet 0  . Coenzyme Q10 (COQ10) 200 MG CAPS Take 200 mg by mouth daily.    . famotidine (PEPCID) 20 MG tablet Take 20 mg by mouth 2 (two) times daily.    . hydrALAZINE (APRESOLINE) 50 MG tablet TAKE 1 TABLET BY MOUTH THREE TIMES DAILY 270 tablet 3  . levothyroxine (SYNTHROID) 137 MCG tablet Take 1 tablet (137 mcg total) by mouth daily before breakfast. 90 tablet 2  .  Melatonin 3 MG TABS Take 1 tablet by mouth at bedtime.    . pantoprazole (PROTONIX) 40 MG tablet Take 1 tablet by mouth once daily 90 tablet 11  . Probiotic Product (PROBIOTIC PO) Take 1 capsule by mouth daily.    . rosuvastatin (CRESTOR) 20 MG tablet Take 1 tablet by mouth once daily 90 tablet 1  . spironolactone (ALDACTONE) 25 MG tablet Take 1 tablet (25 mg total) by mouth daily. Take in the evening 30 tablet 3  . traZODone (DESYREL) 50 MG tablet Take 1 tablet (50 mg total) by mouth at bedtime. (Patient taking differently: Take 50 mg by mouth at bedtime as needed. ) 30 tablet 5  . warfarin (COUMADIN) 5 MG tablet TAKE 2 TABLETS BY MOUTH ONCE DAILY. TAKE 1 TABLET ON MONDAY AND WEDNESDAY 60 tablet 11  .  furosemide (LASIX) 20 MG tablet Take 20 mg by mouth daily as needed (Take 20 mg as daily as needed for wt > 153 lbs). Reported on 01/14/2016    . potassium chloride SA (K-DUR,KLOR-CON) 20 MEQ tablet Take 1 tablet (20 mEq total) by mouth daily as needed. Take one potassium pill when you take your lasix. (Patient not taking: Reported on 07/19/2018) 90 tablet 3   No current facility-administered medications for this encounter.    Facility-Administered Medications Ordered in Other Encounters  Medication Dose Route Frequency Provider Last Rate Last Dose  . etomidate (AMIDATE) injection    Anesthesia Intra-op Myna Bright, CRNA   20 mg at 02/08/14 0915  . rocuronium [facility]) injection    Anesthesia Intra-op Myna Bright, CRNA   50 mg at 02/08/14 0932  . succinylcholine (ANECTINE) injection    Anesthesia Intra-op Myna Bright, CRNA   100 mg at 02/08/14 0915   Ace inhibitors, Diovan [valsartan], Lipitor [atorvastatin calcium], and Norvasc [amlodipine]  Review of systems complete and found to be negative unless listed in HPI.    Vitals:   03/22/19 1142 03/22/19 1143  BP: (!) 80/0 (!) 88/56  Pulse:  71  Temp:  98.3 F (36.8 C)  SpO2:  98%  Weight:  73 kg (161 lb)  Height:  5\' 9"  (1.753 m)   Vital Signs:  Temp: 5'9" Doppler Pressure: 80 Automatc BP:  88/56 (72) HR:  71 SPO2: 98% on RA  Weight: 161 lb w/o eqt Last weight: 166.4 lb   Physical Exam: General:  NAD.  HEENT: normal  Neck: supple. JVP not elevated.  Carotids 2+ bilat; no bruits. No lymphadenopathy or thryomegaly appreciated. Cor: LVAD hum.  Lungs: Clear. Abdomen: soft, nontender, non-distended. No hepatosplenomegaly. No bruits or masses. Good bowel sounds. Driveline site clean. Anchor in place.  Extremities: no cyanosis, clubbing, rash. Warm no edema  Neuro: alert & oriented x 3. No focal deficits. Moves all 4 without problem    ASSESSMENT AND PLAN:  1) Chronic systolic HF: ICM, EF 97-35% s/p LVAD  HM II implant 08/2013 for DT.   - Echo 3/18 EF 10-15% mild AI. Mod RV dysfunction. LVAD cannula OK - NYHA I symptoms. Now 5.5 years out.  - Volume status looks good. Never uses diuretics.   - Continue spiro 25 mg daily.  - He has not tolerated ACE-I or amlodipine in the past. - ICD at [facility]. Has seen Dr. Rayann Heman in 2/18 and decided not to replace.    2) LVAD: HMII 08/2013 for DT. - VAD interrogated personally. Parameters stable. - Admitted in May 2018 with elevated LDH with bival.  - LDH  264. No power spikes.  - Continue aspirin and coumadin.  - Hgb stable at 15.8 - Driveline site looks good changes daily when he is sweating a lot.  3) Chronic anticoagulation: INR goal 2.0-3.0.    - INR 2.5. Discussed dosing with PharmD personally. - Continue ASA 325 mg for LVAD + warfarin.   4) HTN:  - Blood pressure well controlled. Continue current regimen.   5) Hypothyroidism:  - Followed by Dr Dwyane Dee. Stable.   6) Hyperlipidemia:  - Continue Crestor. Zetia stopped due to cost.  - No change.   7) CAD - No s/s of ischemia     Total time spent 35 minutes. Over half that time spent discussing above.   Glori Bickers, MD  10:27 PM

## 2019-03-27 ENCOUNTER — Telehealth (HOSPITAL_COMMUNITY): Payer: Self-pay | Admitting: Licensed Clinical Social Worker

## 2019-03-27 NOTE — Telephone Encounter (Signed)
CSW contacted patient to share information about LVAD Support Group meeting virtually today if interested. Patient and wife plan to attend and grateful for the reminder. Raquel Sarna, [place], [place]

## 2019-03-31 ENCOUNTER — Other Ambulatory Visit (HOSPITAL_COMMUNITY): Payer: Self-pay | Admitting: *Deleted

## 2019-03-31 DIAGNOSIS — Z95811 Presence of heart assist device: Secondary | ICD-10-CM

## 2019-03-31 DIAGNOSIS — Z7901 Long term (current) use of anticoagulants: Secondary | ICD-10-CM

## 2019-04-05 ENCOUNTER — Other Ambulatory Visit: Payer: Self-pay

## 2019-04-05 ENCOUNTER — Ambulatory Visit (HOSPITAL_COMMUNITY)
Admission: RE | Admit: 2019-04-05 | Discharge: 2019-04-05 | Disposition: A | Discharge status: Home / Self Care | Payer: Medicare Other | Source: Ambulatory Visit | Attending: Internal Medicine | Admitting: Internal Medicine

## 2019-04-05 ENCOUNTER — Ambulatory Visit (HOSPITAL_COMMUNITY): Payer: Self-pay | Admitting: Pharmacist

## 2019-04-05 DIAGNOSIS — Z7901 Long term (current) use of anticoagulants: Secondary | ICD-10-CM | POA: Diagnosis not present

## 2019-04-05 DIAGNOSIS — Z95811 Presence of heart assist device: Secondary | ICD-10-CM | POA: Diagnosis not present

## 2019-04-05 LAB — PROTIME-INR
INR: 2.7 — ABNORMAL HIGH (ref 0.8–1.2)
Prothrombin Time: 28.1 seconds — ABNORMAL HIGH (ref 11.4–15.2)

## 2019-04-05 LAB — LACTATE DEHYDROGENASE: LDH: 189 U/L (ref 98–192)

## 2019-04-14 ENCOUNTER — Other Ambulatory Visit (HOSPITAL_COMMUNITY): Payer: Self-pay | Admitting: *Deleted

## 2019-04-14 DIAGNOSIS — Z95811 Presence of heart assist device: Secondary | ICD-10-CM

## 2019-04-14 DIAGNOSIS — Z7901 Long term (current) use of anticoagulants: Secondary | ICD-10-CM

## 2019-04-19 ENCOUNTER — Ambulatory Visit (HOSPITAL_COMMUNITY)
Admission: RE | Admit: 2019-04-19 | Discharge: 2019-04-19 | Disposition: A | Discharge status: Home / Self Care | Payer: Medicare Other | Source: Ambulatory Visit | Attending: Internal Medicine | Admitting: Internal Medicine

## 2019-04-19 ENCOUNTER — Other Ambulatory Visit: Payer: Self-pay

## 2019-04-19 ENCOUNTER — Ambulatory Visit (HOSPITAL_COMMUNITY): Payer: Self-pay | Admitting: Pharmacist

## 2019-04-19 DIAGNOSIS — Z7901 Long term (current) use of anticoagulants: Secondary | ICD-10-CM | POA: Insufficient documentation

## 2019-04-19 DIAGNOSIS — Z95811 Presence of heart assist device: Secondary | ICD-10-CM | POA: Insufficient documentation

## 2019-04-19 LAB — PROTIME-INR
INR: 3.4 — ABNORMAL HIGH (ref 0.8–1.2)
Prothrombin Time: 33.5 seconds — ABNORMAL HIGH (ref 11.4–15.2)

## 2019-04-19 LAB — LACTATE DEHYDROGENASE: LDH: 305 U/L — ABNORMAL HIGH (ref 98–192)

## 2019-04-19 NOTE — Progress Notes (Signed)
LVAD INR 

## 2019-04-26 ENCOUNTER — Other Ambulatory Visit (HOSPITAL_COMMUNITY): Payer: Self-pay | Admitting: *Deleted

## 2019-04-26 DIAGNOSIS — Z7901 Long term (current) use of anticoagulants: Secondary | ICD-10-CM

## 2019-04-26 DIAGNOSIS — Z95811 Presence of heart assist device: Secondary | ICD-10-CM

## 2019-05-01 ENCOUNTER — Telehealth (HOSPITAL_COMMUNITY): Payer: Self-pay | Admitting: Licensed Clinical Social Worker

## 2019-05-01 NOTE — Telephone Encounter (Signed)
CSW contacted patient/caregiver to follow up on status with coronavirus and if anything needed. Patient instructed to stay home and use of proper hygiene and call if needed. CSW shared information about LVAD Support Group meeting virtually today if interested. Patient denies any concerns at this time and verbalizes understanding of process for contacting VAD Coordinators if needed. CSW continues to follow as needed. Jackie Jamy Whyte, LCSW, CCSW-MCS [phone] 

## 2019-05-03 ENCOUNTER — Telehealth: Payer: Self-pay | Admitting: Endocrinology

## 2019-05-03 ENCOUNTER — Other Ambulatory Visit: Payer: Self-pay

## 2019-05-03 ENCOUNTER — Ambulatory Visit (HOSPITAL_COMMUNITY)
Admission: RE | Admit: 2019-05-03 | Discharge: 2019-05-03 | Disposition: A | Discharge status: Home / Self Care | Payer: Medicare Other | Source: Ambulatory Visit | Attending: Internal Medicine | Admitting: Internal Medicine

## 2019-05-03 ENCOUNTER — Ambulatory Visit (HOSPITAL_COMMUNITY): Payer: Self-pay | Admitting: Pharmacist

## 2019-05-03 DIAGNOSIS — Z95811 Presence of heart assist device: Secondary | ICD-10-CM | POA: Insufficient documentation

## 2019-05-03 DIAGNOSIS — Z7901 Long term (current) use of anticoagulants: Secondary | ICD-10-CM | POA: Diagnosis not present

## 2019-05-03 LAB — LACTATE DEHYDROGENASE: LDH: 233 U/L — ABNORMAL HIGH (ref 98–192)

## 2019-05-03 LAB — PROTIME-INR
INR: 2.6 — ABNORMAL HIGH (ref 0.8–1.2)
Prothrombin Time: 27.7 seconds — ABNORMAL HIGH (ref 11.4–15.2)

## 2019-05-03 NOTE — Telephone Encounter (Signed)
Noted. rx sent for 30 days and note to be seen with labs.

## 2019-05-03 NOTE — Telephone Encounter (Signed)
Patient has been scheduled for labs on 05/17/19 and appointment with Dr. Dwyane Dee on 05/23/19 at 9:00 a.m.

## 2019-05-03 NOTE — Progress Notes (Signed)
LVAD INR 

## 2019-05-03 NOTE — Telephone Encounter (Signed)
Has not been seen since 05/2018. Refill or deny?

## 2019-05-03 NOTE — Telephone Encounter (Signed)
30-day prescription with note to make appointment with labs. Office staff to call him for appointment, thanks

## 2019-05-11 ENCOUNTER — Encounter: Payer: Self-pay | Admitting: *Deleted

## 2019-05-17 ENCOUNTER — Other Ambulatory Visit (INDEPENDENT_AMBULATORY_CARE_PROVIDER_SITE_OTHER): Payer: Medicare Other

## 2019-05-17 ENCOUNTER — Other Ambulatory Visit: Payer: Self-pay

## 2019-05-17 DIAGNOSIS — E890 Postprocedural hypothyroidism: Secondary | ICD-10-CM

## 2019-05-17 LAB — T4, FREE: Free T4: 1.09 ng/dL (ref 0.60–1.60)

## 2019-05-17 LAB — TSH: TSH: 0.32 u[IU]/mL — ABNORMAL LOW (ref 0.35–4.50)

## 2019-05-22 ENCOUNTER — Telehealth (HOSPITAL_COMMUNITY): Payer: Self-pay | Admitting: Licensed Clinical Social Worker

## 2019-05-22 NOTE — Telephone Encounter (Signed)
CSW contacted patient/caregiver to follow up on status with coronavirus and if anything needed. Patient instructed to stay home and use of proper hygiene and call if needed. CSW shared information about LVAD Support Group meeting virtually today if interested. Patient denies any concerns at this time and verbalizes understanding of process for contacting VAD Coordinators if needed. CSW continues to follow as needed. Jackie Namiyah Grantham, LCSW, CCSW-MCS [phone] 

## 2019-05-23 ENCOUNTER — Encounter: Payer: Self-pay | Admitting: Endocrinology

## 2019-05-23 ENCOUNTER — Other Ambulatory Visit: Payer: Self-pay

## 2019-05-23 ENCOUNTER — Other Ambulatory Visit (HOSPITAL_COMMUNITY): Payer: Self-pay | Admitting: *Deleted

## 2019-05-23 ENCOUNTER — Ambulatory Visit (INDEPENDENT_AMBULATORY_CARE_PROVIDER_SITE_OTHER): Payer: Medicare Other | Admitting: Endocrinology

## 2019-05-23 DIAGNOSIS — E890 Postprocedural hypothyroidism: Secondary | ICD-10-CM

## 2019-05-23 DIAGNOSIS — Z95811 Presence of heart assist device: Secondary | ICD-10-CM

## 2019-05-23 DIAGNOSIS — Z7901 Long term (current) use of anticoagulants: Secondary | ICD-10-CM

## 2019-05-23 MED ORDER — LEVOTHYROXINE SODIUM 137 MCG PO TABS: ORAL_TABLET | ORAL | 1 refills | Status: DC

## 2019-05-23 NOTE — Progress Notes (Signed)
Patient ID: Johnny Oneill, male   DOB: [birth year], 78 y.o.   MRN: [id number]   Reason for Appointment:  Hypothyroidism, followup visit  Today's office visit was provided via telemedicine using a telephone call to the patient Patient has been explained the limitations of evaluation and management by telemedicine and the availability of in person appointments.  The patient understood the limitations and agreed to proceed. Patient also understood that the telehealth visit is billable.  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  Previously was taking 200 mcg daily since 3/15 when he was hospitalized  Subsequently he was on steady dose of 175 mcg since 11/2013 Subsequently the dose was reduced gradually   Current regimen: he has been taking 137 mcg daily since his last visit in 05/2018 He has not been seen in follow-up since then  He generally feels fairly good, no dyspnea on exertion, unusual fatigue, shakiness He has cut back on sweets and is trying to lose weight  He gets his prescription from [place] as before  He is very regular with taking the tablet in the morning before breakfast.   Does not take any iron or calcium-containing vitamins currently  His TSH is again slightly lower even with slight reduction of his dosage last year Free T4 minimally lower than before   Wt Readings from Last 3 Encounters:  03/22/19 161 lb (73 kg)  09/20/18 166 lb 6.4 oz (75.5 kg)  07/19/18 170 lb 12.8 oz (77.5 kg)    Lab Results  Component Value Date   TSH 0.32 (L)  05/17/2019   TSH 0.94 05/30/2018   TSH 2.69 11/29/2017   FREET4 1.09 05/17/2019   FREET4 1.18 05/30/2018   FREET4 0.96 11/29/2017      Allergies as of 05/23/2019      Reactions   Ace Inhibitors    Hypotension and elevated creatinine   Diovan [valsartan] Other (See Comments)   Hypotension at low dose, hyperkalemia   Lipitor [atorvastatin Calcium] Other (See Comments)   Muscle pain   Norvasc [amlodipine] Other (See Comments)   Put patient to sleep      Medication List       Accurate as of May 23, 2019  8:39 AM. If you have any questions, ask your nurse or doctor.        acetaminophen 500 MG chewable tablet Commonly known as: TYLENOL Chew 500 mg by mouth every 6 (six) hours as needed for pain.   aspirin EC 325 MG tablet Take 325 mg by mouth daily.   citalopram 10 MG tablet Commonly known as: CELEXA Take 1 tablet by mouth once daily   CoQ10 200 MG Caps Take 200 mg by mouth daily.   famotidine 20 MG tablet Commonly known as: PEPCID Take 20 mg by mouth 2 (two) times daily.   furosemide 20 MG tablet Commonly known as: LASIX Take 20 mg by mouth daily as needed (Take 20 mg as daily as needed for wt > 153 lbs). Reported on 01/14/2016   hydrALAZINE 50 MG tablet Commonly known as: APRESOLINE TAKE 1 TABLET BY MOUTH THREE  TIMES DAILY   levothyroxine 137 MCG tablet Commonly known as: SYNTHROID Take 1 tab by mouth in the morning on an empty stomach. **must be seen for appt with labs for refills.**   Melatonin 3 MG Tabs Take 1 tablet by mouth at bedtime.   pantoprazole 40 MG tablet Commonly known as: PROTONIX Take 1 tablet by mouth once daily   potassium chloride SA 20 MEQ tablet Commonly known as: K-DUR Take 1 tablet (20 mEq total) by mouth daily as needed. Take one potassium pill when you take your lasix.   PROBIOTIC PO Take 1 capsule by mouth daily.   rosuvastatin 20 MG tablet Commonly known as: CRESTOR Take 1 tablet by mouth once daily     spironolactone 25 MG tablet Commonly known as: ALDACTONE Take 1 tablet (25 mg total) by mouth daily. Take in the evening   traZODone 50 MG tablet Commonly known as: DESYREL Take 1 tablet (50 mg total) by mouth at bedtime. What changed:   when to take this  reasons to take this   warfarin 5 MG tablet Commonly known as: COUMADIN Take as directed by the anticoagulation clinic. If you are unsure how to take this medication, talk to your nurse or doctor. Original instructions: TAKE 2 TABLETS BY MOUTH ONCE DAILY. TAKE 1 TABLET ON MONDAY AND WEDNESDAY        Past Medical History:  Diagnosis Date   Anxiety    Automatic implantable cardioverter-defibrillator in situ    CAD (coronary artery disease)    S/P stenting of LCX in 2004;  s/p Lat MI 2009 with occlusion of the LCX - treated with Promus stenting. Has total occlusion of the RCA.    CHF (congestive heart failure) ([facility])    Hypercholesteremia    Hypertension    Hypothyroidism    ICD (implantable cardiac defibrillator) in place    PROPHYLACTIC      medtronic   Inferior MI ([place]) "? date"; 2009   Ischemic cardiomyopathy    a. 10/09 Echo: Sev LV Dysfxn, inf/lat AK, Mod MR.   LVAD (left ventricular assist device) present ([place])    Osteoarthritis    a. s/p R TKR   Pacemaker    PVC (premature ventricular contraction)    RBBB    Shortness of breath     Past Surgical History:  Procedure Laterality Date   CARPAL TUNNEL RELEASE Right 05/29/2016   Procedure: CARPAL TUNNEL RELEASE;  Surgeon: Earlie Server, MD;  Location: [place];  Service: Orthopedics;  Laterality: Right;   CORONARY ANGIOPLASTY WITH STENT PLACEMENT  2004   CORONARY ANGIOPLASTY WITH STENT PLACEMENT  07/2008   DEFIBRILLATOR  2009   ESOPHAGOGASTRODUODENOSCOPY N/A 09/15/2013   Procedure: ESOPHAGOGASTRODUODENOSCOPY (EGD);  Surgeon: Ladene Artist, MD;  Location: [facility] ENDOSCOPY;  Service: Endoscopy;  Laterality: N/A;  bedside   HEMATOMA EVACUATION  Right 11/06/2013   Procedure: EVACUATION HEMATOMA;  Surgeon: Gaye Pollack, MD;  Location: [place];  Service: Cardiothoracic;  Laterality: Right;   INSERT / REPLACE / REMOVE PACEMAKER  2009   INSERTION OF IMPLANTABLE LEFT VENTRICULAR ASSIST DEVICE N/A 09/18/2013   Procedure: INSERTION OF IMPLANTABLE LEFT VENTRICULAR ASSIST DEVICE;  Surgeon: Ivin Poot, MD;  Location: [place];  Service: Open Heart Surgery;  Laterality: N/A;  CIRC ARREST  NITRIC OXIDE   INTRA-AORTIC BALLOON PUMP INSERTION N/A 09/14/2013   Procedure: INTRA-AORTIC BALLOON PUMP INSERTION;  Surgeon: Jolaine Artist, MD;  Location: [facility] CATH LAB;  Service: Cardiovascular;  Laterality: N/A;  INTRAOPERATIVE TRANSESOPHAGEAL ECHOCARDIOGRAM N/A 09/18/2013   Procedure: INTRAOPERATIVE TRANSESOPHAGEAL ECHOCARDIOGRAM;  Surgeon: Ivin Poot, MD;  Location: [place];  Service: Open Heart Surgery;  Laterality: N/A;   KNEE ARTHROSCOPY     bilaterally   KNEE ARTHROSCOPY W/ PARTIAL MEDIAL MENISCECTOMY  12/2002   left   LEFT VENTRICULAR ASSIST DEVICE     RIGHT HEART CATHETERIZATION N/A 08/25/2013   Procedure: RIGHT HEART CATH;  Surgeon: Jolaine Artist, MD;  Location: [facility] CATH LAB;  Service: Cardiovascular;  Laterality: N/A;   RIGHT HEART CATHETERIZATION N/A 09/13/2013   Procedure: RIGHT HEART CATH;  Surgeon: Jolaine Artist, MD;  Location[facility] CATH LAB;  Service: Cardiovascular;  Laterality: N/A;   RIGHT HEART CATHETERIZATION N/A 02/08/2014   Procedure: RIGHT HEART CATH;  Surgeon: Jolaine Artist, MD;  Location: [facility] CATH LAB;  Service: Cardiovascular;  Laterality: N/A;   RIGHT HEART CATHETERIZATION N/A 02/12/2014   Procedure: RIGHT HEART CATH;  Surgeon: Jolaine Artist, MD;  Location: [facility] CATH LAB;  Service: Cardiovascular;  Laterality: N/A;   TOTAL KNEE ARTHROPLASTY  11/27/11   left   TOTAL KNEE ARTHROPLASTY  11/27/2011   Procedure: TOTAL KNEE ARTHROPLASTY;  Surgeon: Yvette Rack., MD;  Location: [place];  Service: Orthopedics;   Laterality: Left;  left total knee arthroplasty   VIDEO ASSISTED THORACOSCOPY Right 11/06/2013   Procedure: VIDEO ASSISTED THORACOSCOPY;  Surgeon: Gaye Pollack, MD;  Location[facility] OR;  Service: Cardiothoracic;  Laterality: Right;    Family History  Problem Relation Age of Onset   Heart failure Father    Stroke Father    Coronary artery disease Mother    Heart disease Mother    Hyperlipidemia Sister     Social History:  reports that he has never smoked. He has never used smokeless tobacco. He reports that he does not drink alcohol or use drugs.  Allergies:  Allergies  Allergen Reactions   Ace Inhibitors     Hypotension and elevated creatinine   Diovan [Valsartan] Other (See Comments)    Hypotension at low dose, hyperkalemia   Lipitor [Atorvastatin Calcium] Other (See Comments)    Muscle pain   Norvasc [Amlodipine] Other (See Comments)    Put patient to sleep   ROS:  Cardiomyopathy: He has been using a ventricle assist device and he is on anticoagulation  He has had problems with knee pain   Examination:   There were no vitals taken for this visit.  Exam not done, patient remote    Assessment/Plan:   Post ablative Hypothyroidism:  He was changed to 137 mcg last year and he has not come back now to follow-up Subjectively doing well He is very regular with taking his levothyroxine as prescribed every morning  Because of his cardiac history has been trying to get his TSH mid normal and now it is below normal slightly  He will continue the same prescription for 137 mcg but take 6-1/2 tablets a week, he can take the half tablet on any particular day that he can remember during the week  Discussed need for regular follow-up and he will see [place] back in 3 months  There are no Patient Instructions on file for this visit.  Duration of telephone encounter =5 minutes  Elayne Snare 05/23/2019, 8:39 AM

## 2019-05-24 ENCOUNTER — Ambulatory Visit (HOSPITAL_COMMUNITY)
Admission: RE | Admit: 2019-05-24 | Discharge: 2019-05-24 | Disposition: A | Discharge status: Home / Self Care | Payer: Medicare Other | Source: Ambulatory Visit | Attending: Internal Medicine | Admitting: Internal Medicine

## 2019-05-24 ENCOUNTER — Other Ambulatory Visit (HOSPITAL_COMMUNITY): Payer: Self-pay | Admitting: *Deleted

## 2019-05-24 ENCOUNTER — Ambulatory Visit (HOSPITAL_COMMUNITY): Payer: Self-pay | Admitting: Pharmacist

## 2019-05-24 ENCOUNTER — Other Ambulatory Visit: Payer: Self-pay

## 2019-05-24 VITALS — BP 95/69 | HR 82 | Temp 98.2°F | Wt 167.8 lb

## 2019-05-24 DIAGNOSIS — E039 Hypothyroidism, unspecified: Secondary | ICD-10-CM | POA: Insufficient documentation

## 2019-05-24 DIAGNOSIS — M1990 Unspecified osteoarthritis, unspecified site: Secondary | ICD-10-CM | POA: Insufficient documentation

## 2019-05-24 DIAGNOSIS — Z950 Presence of cardiac pacemaker: Secondary | ICD-10-CM | POA: Insufficient documentation

## 2019-05-24 DIAGNOSIS — I110 Hypertensive heart disease with heart failure: Secondary | ICD-10-CM | POA: Diagnosis not present

## 2019-05-24 DIAGNOSIS — I2510 Atherosclerotic heart disease of native coronary artery without angina pectoris: Secondary | ICD-10-CM | POA: Diagnosis not present

## 2019-05-24 DIAGNOSIS — Z95811 Presence of heart assist device: Secondary | ICD-10-CM

## 2019-05-24 DIAGNOSIS — I469 Cardiac arrest, cause unspecified: Secondary | ICD-10-CM | POA: Insufficient documentation

## 2019-05-24 DIAGNOSIS — E7800 Pure hypercholesterolemia, unspecified: Secondary | ICD-10-CM | POA: Diagnosis not present

## 2019-05-24 DIAGNOSIS — I255 Ischemic cardiomyopathy: Secondary | ICD-10-CM | POA: Diagnosis not present

## 2019-05-24 DIAGNOSIS — Z7901 Long term (current) use of anticoagulants: Secondary | ICD-10-CM | POA: Diagnosis not present

## 2019-05-24 DIAGNOSIS — I252 Old myocardial infarction: Secondary | ICD-10-CM | POA: Diagnosis not present

## 2019-05-24 DIAGNOSIS — Z7982 Long term (current) use of aspirin: Secondary | ICD-10-CM | POA: Diagnosis not present

## 2019-05-24 DIAGNOSIS — I5022 Chronic systolic (congestive) heart failure: Secondary | ICD-10-CM | POA: Diagnosis not present

## 2019-05-24 DIAGNOSIS — F419 Anxiety disorder, unspecified: Secondary | ICD-10-CM | POA: Insufficient documentation

## 2019-05-24 DIAGNOSIS — E785 Hyperlipidemia, unspecified: Secondary | ICD-10-CM | POA: Diagnosis not present

## 2019-05-24 DIAGNOSIS — Z79899 Other long term (current) drug therapy: Secondary | ICD-10-CM | POA: Insufficient documentation

## 2019-05-24 LAB — BASIC METABOLIC PANEL
Anion gap: 9 (ref 5–15)
BUN: 20 mg/dL (ref 8–23)
CO2: 25 mmol/L (ref 22–32)
Calcium: 9 mg/dL (ref 8.9–10.3)
Chloride: 104 mmol/L (ref 98–111)
Creatinine, Ser: 1.56 mg/dL — ABNORMAL HIGH (ref 0.61–1.24)
GFR calc Af Amer: 50 mL/min — ABNORMAL LOW (ref >60)
GFR calc non Af Amer: 43 mL/min — ABNORMAL LOW (ref >60)
Glucose, Bld: 107 mg/dL — ABNORMAL HIGH (ref 70–99)
Potassium: 4.4 mmol/L (ref 3.5–5.1)
Sodium: 138 mmol/L (ref 135–145)

## 2019-05-24 LAB — VITAMIN B12: Vitamin B-12: 140 pg/mL — ABNORMAL LOW (ref 180–914)

## 2019-05-24 LAB — CBC
HCT: 43.5 % (ref 39.0–52.0)
Hemoglobin: 14.3 g/dL (ref 13.0–17.0)
MCH: 30.8 pg (ref 26.0–34.0)
MCHC: 32.9 g/dL (ref 30.0–36.0)
MCV: 93.5 fL (ref 80.0–100.0)
Platelets: 152 10*3/uL (ref 150–400)
RBC: 4.65 MIL/uL (ref 4.22–5.81)
RDW: 14.6 % (ref 11.5–15.5)
WBC: 5.7 10*3/uL (ref 4.0–10.5)
nRBC: 0 % (ref 0.0–0.2)

## 2019-05-24 LAB — IRON AND TIBC
Iron: 66 ug/dL (ref 45–182)
Saturation Ratios: 24 % (ref 17.9–39.5)
TIBC: 280 ug/dL (ref 250–450)
UIBC: 214 ug/dL

## 2019-05-24 LAB — PROTIME-INR
INR: 2.9 — ABNORMAL HIGH (ref 0.8–1.2)
Prothrombin Time: 29.8 seconds — ABNORMAL HIGH (ref 11.4–15.2)

## 2019-05-24 LAB — FERRITIN: Ferritin: 136 ng/mL (ref 24–336)

## 2019-05-24 LAB — LACTATE DEHYDROGENASE: LDH: 240 U/L — ABNORMAL HIGH (ref 98–192)

## 2019-05-24 LAB — FOLATE: Folate: 12.8 ng/mL (ref >5.9)

## 2019-05-24 NOTE — Progress Notes (Signed)
Patient presents for 2 month follow up in [facility] today. Reports no problems with VAD equipment or concerns with drive line.   Patient states that he has been feeling great. Able to complete all of his usual activities. Fluid status looks good today. Has not needed PRN Lasix.   Will schedule for complete echo next week. EKG completed today and reviewed by Dr Haroldine Laws.   Vital Signs:  Temp: 5'9" Doppler Pressure: 82 Automatc BP:  95/69 (77) HR: 82 SPO2: 78% on RA  Weight: 167.8 lb w/o eqt Last weight: 161 lb  VAD Indication: Destination therapy- patient choice    VAD interrogation & Equipment Management: Speed: 9200 Flow:  5.5 Power: 5.7 w    PI: 6.1  Alarms: few low voltage  Events: 5- 10 PI events daily  Fixed speed 9200  Low speed limit: 8600  Primary Controller:  Replace back up battery in 16 months Back up controller:  Did not bring back up bag   Annual Equipment Maintenance on UBC/PM was performed on 05/2019  I reviewed the LVAD parameters from today and compared the results to the patient's prior recorded data. LVAD interrogation was NEGATIVE for significant power changes, NEGATIVE for clinical alarms and STABLE for PI events/speed drops. No programming changes were made and pump is functioning within specified parameters. Pt is performing daily controller and system monitor self tests along with completing weekly and monthly maintenance for LVAD equipment.  Exit Site Care: Drive line is being maintained every three days using gauze dressing during heat of summer by [name], his wife. Has transitioned back to weekly dressing changes with Sorbaview.  Dressing dry and intact. Stabilization device present and accurately applied. Pt denies fever or chills. Pt has CHG allergy. Pt provided with 8 weekly dressing kits.  Significant Events on VAD Support:  10/2013>SBO 12/2016> elevated LDH, admit for Bival  Device:Medtronic ERI Therapies: off Last check:   09/23/17  BP & Labs:  Doppler BP 80 - Doppler is reflecting modified systolic  Hgb 123456 - No S/S of bleeding. Specifically denies melena/BRBPR or nosebleeds.  LDH 240 with established baseline of 200- 250. Denies tea-colored urine. No power elevations noted on interrogation.   Annual maintenance completed per [employer] on patient's home power module and Electrical engineer. Patient has 2 sets of brand new batteries at home that he has not used yet. Will rotate out his old batteries.   Patient Instructions:  1. No medication changes today 2. Continue current Coumadin regime per Ander Purpura PharmD 3. Return to clinic next Wednesday Sept 30th at 0915 for complete echo 4. Return to clinic in 3 weeks for INR/LDH 5. Return to clinic in 2 months for full follow up visit  Emerson Monte RN [place] Coordinator  Office: [phone]  24/7 Pager: [phone]

## 2019-05-24 NOTE — Patient Instructions (Signed)
1. No medication changes today 2. Continue current Coumadin regime per Ander Purpura PharmD 3. Return to clinic next Wednesday Sept 30th at 0915 for complete echo 4. Return to clinic in 3 weeks for INR/LDH 5. Return to clinic in 2 months for full follow up visit

## 2019-05-24 NOTE — Progress Notes (Signed)
LVAD INR 

## 2019-05-24 NOTE — Progress Notes (Signed)
VAD CLINIC NOTE   HPI:  Johnny Oneill is a 78 y.o. 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   Admitted 3/4-3/25/15 with SOB and syncopal episode. Found to have SBO and NG placed and started on TPN. On 11/06/13 developed progressive SOB and hypotension and co-ox 51% and Hgb 6.7. Started on milrinone and transfused. Echo showed nl RV function. Patient had PEA arrest and was intubated and started on pressors. CXR revealed a R hemothorax and a R CT placed. He required VATs and evacuation of hemothorax and was then teansferred to CIR.   LVAD Issues  Elevated LDH--bivalirudin. D/C LDH 188  He presents today for regular follow up. He is doing great almost 6 years post LVAD. Active with many activities including cleaning his church. Denies orthopnea or PND. No fevers, chills or problems with driveline. No bleeding, melena or neuro symptoms. No VAD alarms. Taking all meds as prescribed.    Echo 3/18 EF 10-15%. RV moderate HK. LVAD cannula ok. Mild AI  Echo 9/16 EF 20%, LVAD cannula ok moderate RV dilation with mild to moderately decreased RV systolic function.    VAD Indication: Destination therapy- patient choice    VAD interrogation & Equipment Management: Speed: 9200 Flow:  5.5 Power: 5.7 w PI: 6.1  Alarms: few low voltage  Events: 5- 10 PI events daily  Fixed speed 9200  Low speed limit: 8600  Primary Controller: Replace back up battery in 16 months Back up controller: Did not bring back up bag   Annual Equipment Maintenance on UBC/PM was performed on 05/2019  Past Medical History:  Diagnosis Date  . Anxiety   . Automatic implantable cardioverter-defibrillator in situ   . CAD (coronary artery disease)    S/P stenting of LCX in 2004;  s/p Lat MI 2009 with occlusion of the LCX - treated with Promus stenting. Has total occlusion of the RCA.   [name] CHF (congestive heart failure)  ([place])   . Hypercholesteremia   . Hypertension   . Hypothyroidism   . ICD (implantable cardiac defibrillator) in place    PROPHYLACTIC      medtronic  . Inferior MI ([place]) "? date"; 2009  . Ischemic cardiomyopathy    a. 10/09 Echo: Sev LV Dysfxn, inf/lat AK, Mod MR.  [name] LVAD (left ventricular assist device) present ([place])   . Osteoarthritis    a. s/p R TKR  . Pacemaker   . PVC (premature ventricular contraction)   . RBBB   . Shortness of breath     Current Outpatient Medications  Medication Sig Dispense Refill  . aspirin EC 325 MG tablet Take 325 mg by mouth daily.    . citalopram (CELEXA) 10 MG tablet Take 1 tablet by mouth once daily 90 tablet 0  . Coenzyme Q10 (COQ10) 200 MG CAPS Take 200 mg by mouth daily.    . famotidine (PEPCID) 20 MG tablet Take 20 mg by mouth 2 (two) times daily.    . hydrALAZINE (APRESOLINE) 50 MG tablet TAKE 1 TABLET BY MOUTH THREE TIMES DAILY 270 tablet 3  . levothyroxine (SYNTHROID) 137 MCG tablet Take 1 tab by mouth in the morning (except half tablet on Sundays) on an empty stomach. 90 tablet 1  . Melatonin 3 MG TABS Take 1 tablet by mouth at bedtime.    . pantoprazole (PROTONIX) 40 MG tablet Take 1 tablet by mouth once daily 90  tablet 11  . Probiotic Product (PROBIOTIC PO) Take 1 capsule by mouth daily.    . rosuvastatin (CRESTOR) 20 MG tablet Take 1 tablet by mouth once daily 90 tablet 1  . spironolactone (ALDACTONE) 25 MG tablet Take 1 tablet (25 mg total) by mouth daily. Take in the evening 30 tablet 3  . traZODone (DESYREL) 50 MG tablet Take 1 tablet (50 mg total) by mouth at bedtime. (Patient taking differently: Take 50 mg by mouth at bedtime as needed. ) 30 tablet 5  . warfarin (COUMADIN) 5 MG tablet TAKE 2 TABLETS BY MOUTH ONCE DAILY. TAKE 1 TABLET ON MONDAY AND WEDNESDAY (Patient taking differently: Take 5 mg (1 tab) on Monday and Friday and 10 mg all other days) 60 tablet 11  . acetaminophen (TYLENOL) 500 MG chewable tablet Chew 500 mg by mouth  every 6 (six) hours as needed for pain.    . furosemide (LASIX) 20 MG tablet Take 20 mg by mouth daily as needed (Take 20 mg as daily as needed for wt > 153 lbs). Reported on 01/14/2016    . potassium chloride SA (K-DUR,KLOR-CON) 20 MEQ tablet Take 1 tablet (20 mEq total) by mouth daily as needed. Take one potassium pill when you take your lasix. (Patient not taking: Reported on 05/24/2019) 90 tablet 3   No current facility-administered medications for this encounter.    Facility-Administered Medications Ordered in Other Encounters  Medication Dose Route Frequency Provider Last Rate Last Dose  . etomidate (AMIDATE) injection    Anesthesia Intra-op Myna Bright, CRNA   20 mg at 02/08/14 0915  . rocuronium [facility]) injection    Anesthesia Intra-op Myna Bright, CRNA   50 mg at 02/08/14 0932  . succinylcholine (ANECTINE) injection    Anesthesia Intra-op Myna Bright, CRNA   100 mg at 02/08/14 0915   Ace inhibitors, Diovan [valsartan], Lipitor [atorvastatin calcium], and Norvasc [amlodipine]  Review of systems complete and found to be negative unless listed in HPI.    Vitals:   05/24/19 1026 05/24/19 1027  BP: (!) 82/0 95/69  Pulse: 82   Temp: 98.2 F (36.8 C)   TempSrc: Oral   SpO2: 97%   Weight: 76.1 kg (167 lb 12.8 oz)    Vital Signs:  Temp: 5'9" Doppler Pressure: 82 Automatc BP:  95/69 (77) HR: 82 SPO2: 78% on RA  Weight: 167.8 lb w/o eqt Last weight: 161 lb   Physical Exam: General:  NAD.  HEENT: normal  Neck: supple. JVP 6-7 with prominent CV waves .  Carotids 2+ bilat; no bruits. No lymphadenopathy or thryomegaly appreciated. Cor: LVAD hum.  Lungs: Clear. Abdomen:soft, nontender, non-distended. No hepatosplenomegaly. No bruits or masses. Good bowel sounds. Driveline site clean. Anchor in place.  Extremities: no cyanosis, clubbing, rash. Warm no edema  Neuro: alert & oriented x 3. No focal deficits. Moves all 4 without problem    ASSESSMENT AND  PLAN:  1) Chronic systolic HF: ICM, EF 0000000 s/p LVAD HM II implant 08/2013 for DT.   - Echo 3/18 EF 10-15% mild AI. Mod RV dysfunction. LVAD cannula OK - Stable NYHA I. Now almost 6 years out.  - Volume status looks good. Never uses diuretics.   - Continue spiro 25 mg daily.  - He has not tolerated ACE-I or amlodipine in the past. - ICD at [facility]. Has seen Dr. Rayann Heman in 2/18 and decided not to replace.    2) LVAD: HMII 08/2013 for DT. - VAD interrogated personally. Parameters  stable. - Admitted in May 2018 with elevated LDH with bival.  - LDH 240. No power spikes.  - Continue aspirin and coumadin.  - Hgb stable at 14.3 - Driveline site looks good.  - Prominent CV waves on exam suggestive of TR. Will get echo  3) Chronic anticoagulation: INR goal 2.0-3.0.    - INR 2.9 Discussed dosing with PharmD personally. - Continue ASA 325 mg for LVAD + warfarin.   4) HTN:  - Blood pressure well controlled. Continue current regimen.   5) Hypothyroidism:  - Followed by Dr Dwyane Dee. Stable.   6) Hyperlipidemia:  - Continue Crestor. Zetia stopped due to cost.  - No change.   7) CAD -No s/s of ischemia  Total time spent 35 minutes. Over half that time spent discussing above.    Glori Bickers, MD  4:11 PM

## 2019-05-25 ENCOUNTER — Telehealth: Payer: Self-pay

## 2019-05-25 MED ORDER — LEVOTHYROXINE SODIUM 137 MCG PO TABS: ORAL_TABLET | ORAL | 1 refills | Status: DC

## 2019-05-25 NOTE — Telephone Encounter (Signed)
Patient states that he has never used the [facility] mail service before and would this RX sent to [place], [place].  Please Advise, Thanks

## 2019-05-26 ENCOUNTER — Other Ambulatory Visit: Payer: Self-pay

## 2019-05-26 MED ORDER — LEVOTHYROXINE SODIUM 137 MCG PO TABS: ORAL_TABLET | ORAL | 1 refills | Status: DC

## 2019-05-26 NOTE — Telephone Encounter (Signed)
Rx was sent to pharmacy requested below. In regards to [facility], the Rx was sent there because a request was received to send it there, and it was already on pt's chart. Since pt has never used, it will be removed.

## 2019-05-31 ENCOUNTER — Ambulatory Visit (HOSPITAL_COMMUNITY)
Admission: RE | Admit: 2019-05-31 | Discharge: 2019-05-31 | Disposition: A | Discharge status: Home / Self Care | Payer: Medicare Other | Source: Ambulatory Visit | Attending: Internal Medicine | Admitting: Internal Medicine

## 2019-05-31 ENCOUNTER — Other Ambulatory Visit: Payer: Self-pay

## 2019-05-31 DIAGNOSIS — Z95811 Presence of heart assist device: Secondary | ICD-10-CM | POA: Diagnosis not present

## 2019-05-31 DIAGNOSIS — I10 Essential (primary) hypertension: Secondary | ICD-10-CM | POA: Insufficient documentation

## 2019-05-31 DIAGNOSIS — R0602 Shortness of breath: Secondary | ICD-10-CM | POA: Insufficient documentation

## 2019-05-31 DIAGNOSIS — I083 Combined rheumatic disorders of mitral, aortic and tricuspid valves: Secondary | ICD-10-CM | POA: Diagnosis not present

## 2019-05-31 DIAGNOSIS — I2510 Atherosclerotic heart disease of native coronary artery without angina pectoris: Secondary | ICD-10-CM | POA: Insufficient documentation

## 2019-06-02 ENCOUNTER — Telehealth (HOSPITAL_COMMUNITY): Payer: Self-pay | Admitting: Licensed Clinical Social Worker

## 2019-06-02 NOTE — Telephone Encounter (Signed)
CSW contacted patient to remind of virtual VAD support group on Monday and to review needs for Covid 19 public health concerns and emergency VAD pager number. Jackie Shilynn Hoch, LCSW, CCSW-MCS [phone]  

## 2019-06-09 ENCOUNTER — Other Ambulatory Visit (HOSPITAL_COMMUNITY): Payer: Self-pay | Admitting: *Deleted

## 2019-06-09 DIAGNOSIS — Z7901 Long term (current) use of anticoagulants: Secondary | ICD-10-CM

## 2019-06-09 DIAGNOSIS — Z95811 Presence of heart assist device: Secondary | ICD-10-CM

## 2019-06-14 ENCOUNTER — Ambulatory Visit (HOSPITAL_COMMUNITY): Payer: Self-pay | Admitting: Pharmacist

## 2019-06-14 ENCOUNTER — Ambulatory Visit (HOSPITAL_COMMUNITY)
Admission: RE | Admit: 2019-06-14 | Discharge: 2019-06-14 | Disposition: A | Discharge status: Home / Self Care | Payer: Medicare Other | Source: Ambulatory Visit | Attending: Internal Medicine | Admitting: Internal Medicine

## 2019-06-14 ENCOUNTER — Other Ambulatory Visit: Payer: Self-pay

## 2019-06-14 DIAGNOSIS — Z7901 Long term (current) use of anticoagulants: Secondary | ICD-10-CM | POA: Diagnosis not present

## 2019-06-14 DIAGNOSIS — Z95811 Presence of heart assist device: Secondary | ICD-10-CM | POA: Diagnosis not present

## 2019-06-14 LAB — PROTIME-INR
INR: 2.4 — ABNORMAL HIGH (ref 0.8–1.2)
Prothrombin Time: 25.8 seconds — ABNORMAL HIGH (ref 11.4–15.2)

## 2019-06-14 LAB — LACTATE DEHYDROGENASE: LDH: 218 U/L — ABNORMAL HIGH (ref 98–192)

## 2019-06-14 NOTE — Progress Notes (Signed)
LVAD INR 

## 2019-06-20 ENCOUNTER — Other Ambulatory Visit (HOSPITAL_COMMUNITY): Payer: Self-pay | Admitting: Internal Medicine

## 2019-06-20 DIAGNOSIS — I255 Ischemic cardiomyopathy: Secondary | ICD-10-CM

## 2019-06-27 ENCOUNTER — Encounter (HOSPITAL_COMMUNITY): Payer: Self-pay | Admitting: Emergency Medicine

## 2019-06-27 ENCOUNTER — Emergency Department (HOSPITAL_COMMUNITY): Payer: Medicare Other

## 2019-06-27 ENCOUNTER — Inpatient Hospital Stay (HOSPITAL_COMMUNITY)
Admission: EM | Admit: 2019-06-27 | Discharge: 2019-06-29 | DRG: 245 | Disposition: A | Discharge status: Home / Self Care | Payer: Medicare Other | Attending: Internal Medicine | Admitting: Internal Medicine

## 2019-06-27 ENCOUNTER — Other Ambulatory Visit: Payer: Self-pay

## 2019-06-27 DIAGNOSIS — I252 Old myocardial infarction: Secondary | ICD-10-CM | POA: Diagnosis not present

## 2019-06-27 DIAGNOSIS — Y92481 Parking lot as the place of occurrence of the external cause: Secondary | ICD-10-CM

## 2019-06-27 DIAGNOSIS — Z955 Presence of coronary angioplasty implant and graft: Secondary | ICD-10-CM | POA: Diagnosis not present

## 2019-06-27 DIAGNOSIS — Z20828 Contact with and (suspected) exposure to other viral communicable diseases: Secondary | ICD-10-CM | POA: Diagnosis present

## 2019-06-27 DIAGNOSIS — N179 Acute kidney failure, unspecified: Secondary | ICD-10-CM | POA: Diagnosis present

## 2019-06-27 DIAGNOSIS — S0990XA Unspecified injury of head, initial encounter: Secondary | ICD-10-CM | POA: Diagnosis not present

## 2019-06-27 DIAGNOSIS — Z8349 Family history of other endocrine, nutritional and metabolic diseases: Secondary | ICD-10-CM

## 2019-06-27 DIAGNOSIS — S0003XA Contusion of scalp, initial encounter: Secondary | ICD-10-CM | POA: Diagnosis not present

## 2019-06-27 DIAGNOSIS — I428 Other cardiomyopathies: Secondary | ICD-10-CM | POA: Diagnosis present

## 2019-06-27 DIAGNOSIS — I472 Ventricular tachycardia, unspecified: Secondary | ICD-10-CM

## 2019-06-27 DIAGNOSIS — W1839XA Other fall on same level, initial encounter: Secondary | ICD-10-CM | POA: Diagnosis present

## 2019-06-27 DIAGNOSIS — Y9301 Activity, walking, marching and hiking: Secondary | ICD-10-CM | POA: Diagnosis present

## 2019-06-27 DIAGNOSIS — Z823 Family history of stroke: Secondary | ICD-10-CM

## 2019-06-27 DIAGNOSIS — Z95810 Presence of automatic (implantable) cardiac defibrillator: Secondary | ICD-10-CM

## 2019-06-27 DIAGNOSIS — E7800 Pure hypercholesterolemia, unspecified: Secondary | ICD-10-CM | POA: Diagnosis present

## 2019-06-27 DIAGNOSIS — I255 Ischemic cardiomyopathy: Secondary | ICD-10-CM | POA: Diagnosis present

## 2019-06-27 DIAGNOSIS — Z79899 Other long term (current) drug therapy: Secondary | ICD-10-CM

## 2019-06-27 DIAGNOSIS — Z952 Presence of prosthetic heart valve: Secondary | ICD-10-CM | POA: Diagnosis not present

## 2019-06-27 DIAGNOSIS — F419 Anxiety disorder, unspecified: Secondary | ICD-10-CM | POA: Diagnosis present

## 2019-06-27 DIAGNOSIS — R55 Syncope and collapse: Secondary | ICD-10-CM | POA: Diagnosis not present

## 2019-06-27 DIAGNOSIS — I110 Hypertensive heart disease with heart failure: Secondary | ICD-10-CM | POA: Diagnosis present

## 2019-06-27 DIAGNOSIS — R519 Headache, unspecified: Secondary | ICD-10-CM | POA: Diagnosis not present

## 2019-06-27 DIAGNOSIS — Z8249 Family history of ischemic heart disease and other diseases of the circulatory system: Secondary | ICD-10-CM

## 2019-06-27 DIAGNOSIS — Z7901 Long term (current) use of anticoagulants: Secondary | ICD-10-CM

## 2019-06-27 DIAGNOSIS — E860 Dehydration: Secondary | ICD-10-CM | POA: Diagnosis not present

## 2019-06-27 DIAGNOSIS — Z95811 Presence of heart assist device: Secondary | ICD-10-CM

## 2019-06-27 DIAGNOSIS — Z7982 Long term (current) use of aspirin: Secondary | ICD-10-CM

## 2019-06-27 DIAGNOSIS — Z79890 Hormone replacement therapy (postmenopausal): Secondary | ICD-10-CM | POA: Diagnosis not present

## 2019-06-27 DIAGNOSIS — I5022 Chronic systolic (congestive) heart failure: Secondary | ICD-10-CM | POA: Diagnosis not present

## 2019-06-27 DIAGNOSIS — I2510 Atherosclerotic heart disease of native coronary artery without angina pectoris: Secondary | ICD-10-CM | POA: Diagnosis not present

## 2019-06-27 DIAGNOSIS — E890 Postprocedural hypothyroidism: Secondary | ICD-10-CM | POA: Diagnosis present

## 2019-06-27 DIAGNOSIS — Z96653 Presence of artificial knee joint, bilateral: Secondary | ICD-10-CM | POA: Diagnosis present

## 2019-06-27 DIAGNOSIS — M542 Cervicalgia: Secondary | ICD-10-CM | POA: Diagnosis not present

## 2019-06-27 DIAGNOSIS — S199XXA Unspecified injury of neck, initial encounter: Secondary | ICD-10-CM | POA: Diagnosis not present

## 2019-06-27 DIAGNOSIS — I10 Essential (primary) hypertension: Secondary | ICD-10-CM | POA: Diagnosis not present

## 2019-06-27 DIAGNOSIS — Z4502 Encounter for adjustment and management of automatic implantable cardiac defibrillator: Secondary | ICD-10-CM | POA: Diagnosis not present

## 2019-06-27 DIAGNOSIS — Z888 Allergy status to other drugs, medicaments and biological substances status: Secondary | ICD-10-CM

## 2019-06-27 DIAGNOSIS — I340 Nonrheumatic mitral (valve) insufficiency: Secondary | ICD-10-CM | POA: Diagnosis not present

## 2019-06-27 DIAGNOSIS — Z95818 Presence of other cardiac implants and grafts: Secondary | ICD-10-CM

## 2019-06-27 LAB — CBC WITH DIFFERENTIAL/PLATELET
Abs Immature Granulocytes: 0.04 10*3/uL (ref 0.00–0.07)
Basophils Absolute: 0.1 10*3/uL (ref 0.0–0.1)
Basophils Relative: 0 %
Eosinophils Absolute: 0 10*3/uL (ref 0.0–0.5)
Eosinophils Relative: 0 %
HCT: 46.2 % (ref 39.0–52.0)
Hemoglobin: 14.5 g/dL (ref 13.0–17.0)
Immature Granulocytes: 0 %
Lymphocytes Relative: 8 %
Lymphs Abs: 1 10*3/uL (ref 0.7–4.0)
MCH: 29.8 pg (ref 26.0–34.0)
MCHC: 31.4 g/dL (ref 30.0–36.0)
MCV: 94.9 fL (ref 80.0–100.0)
Monocytes Absolute: 0.9 10*3/uL (ref 0.1–1.0)
Monocytes Relative: 7 %
Neutro Abs: 11.1 10*3/uL — ABNORMAL HIGH (ref 1.7–7.7)
Neutrophils Relative %: 85 %
Platelets: 192 10*3/uL (ref 150–400)
RBC: 4.87 MIL/uL (ref 4.22–5.81)
RDW: 14.9 % (ref 11.5–15.5)
WBC: 13.1 10*3/uL — ABNORMAL HIGH (ref 4.0–10.5)
nRBC: 0 % (ref 0.0–0.2)

## 2019-06-27 LAB — PROTIME-INR
INR: 2.5 — ABNORMAL HIGH (ref 0.8–1.2)
Prothrombin Time: 26.8 seconds — ABNORMAL HIGH (ref 11.4–15.2)

## 2019-06-27 LAB — TYPE AND SCREEN
ABO/RH(D): O NEG
Antibody Screen: NEGATIVE

## 2019-06-27 LAB — COMPREHENSIVE METABOLIC PANEL
ALT: 22 U/L (ref 0–44)
AST: 33 U/L (ref 15–41)
Albumin: 4.4 g/dL (ref 3.5–5.0)
Alkaline Phosphatase: 66 U/L (ref 38–126)
Anion gap: 14 (ref 5–15)
BUN: 25 mg/dL — ABNORMAL HIGH (ref 8–23)
CO2: 18 mmol/L — ABNORMAL LOW (ref 22–32)
Calcium: 9.2 mg/dL (ref 8.9–10.3)
Chloride: 100 mmol/L (ref 98–111)
Creatinine, Ser: 1.9 mg/dL — ABNORMAL HIGH (ref 0.61–1.24)
GFR calc Af Amer: 39 mL/min — ABNORMAL LOW (ref >60)
GFR calc non Af Amer: 34 mL/min — ABNORMAL LOW (ref >60)
Glucose, Bld: 143 mg/dL — ABNORMAL HIGH (ref 70–99)
Potassium: 3.9 mmol/L (ref 3.5–5.1)
Sodium: 132 mmol/L — ABNORMAL LOW (ref 135–145)
Total Bilirubin: 1 mg/dL (ref 0.3–1.2)
Total Protein: 7.2 g/dL (ref 6.5–8.1)

## 2019-06-27 LAB — MAGNESIUM: Magnesium: 1.9 mg/dL (ref 1.7–2.4)

## 2019-06-27 LAB — LACTATE DEHYDROGENASE: LDH: 300 U/L — ABNORMAL HIGH (ref 98–192)

## 2019-06-27 MED ORDER — PHENYLEPHRINE 40 MCG/ML (10ML) SYRINGE FOR IV PUSH (FOR BLOOD PRESSURE SUPPORT)
PREFILLED_SYRINGE | INTRAVENOUS | Status: AC
  Filled 2019-06-27: qty 10

## 2019-06-27 MED ORDER — AMIODARONE HCL IN DEXTROSE 360-4.14 MG/200ML-% IV SOLN
60.0000 mg/h | INTRAVENOUS | Status: AC
  Administered 2019-06-27: 60 mg/h via INTRAVENOUS
  Filled 2019-06-27: qty 200

## 2019-06-27 MED ORDER — MAGNESIUM SULFATE 2 GM/50ML IV SOLN
2.0000 g | Freq: Once | INTRAVENOUS | Status: AC
  Administered 2019-06-27: 2 g via INTRAVENOUS
  Filled 2019-06-27: qty 50

## 2019-06-27 MED ORDER — LEVOTHYROXINE SODIUM 25 MCG PO TABS
137.0000 ug | ORAL_TABLET | Freq: Every day | ORAL | Status: DC
  Administered 2019-06-28 – 2019-06-29 (×2): 137 ug via ORAL
  Filled 2019-06-27 (×2): qty 1

## 2019-06-27 MED ORDER — CITALOPRAM HYDROBROMIDE 20 MG PO TABS
10.0000 mg | ORAL_TABLET | Freq: Every day | ORAL | Status: DC
  Administered 2019-06-28 – 2019-06-29 (×2): 10 mg via ORAL
  Filled 2019-06-27 (×2): qty 1

## 2019-06-27 MED ORDER — AMIODARONE LOAD VIA INFUSION
150.0000 mg | Freq: Once | INTRAVENOUS | Status: AC
  Administered 2019-06-27: 150 mg via INTRAVENOUS
  Filled 2019-06-27: qty 83.34

## 2019-06-27 MED ORDER — ACETAMINOPHEN 80 MG PO CHEW: 500.0000 mg | CHEWABLE_TABLET | ORAL | Status: DC | PRN

## 2019-06-27 MED ORDER — PANTOPRAZOLE SODIUM 40 MG PO TBEC
40.0000 mg | DELAYED_RELEASE_TABLET | Freq: Every day | ORAL | Status: DC
  Administered 2019-06-28 – 2019-06-29 (×2): 40 mg via ORAL
  Filled 2019-06-27 (×2): qty 1

## 2019-06-27 MED ORDER — ACETAMINOPHEN 325 MG PO TABS
650.0000 mg | ORAL_TABLET | ORAL | Status: DC | PRN
  Administered 2019-06-27 – 2019-06-28 (×2): 650 mg via ORAL
  Filled 2019-06-27 (×2): qty 2

## 2019-06-27 MED ORDER — PROPOFOL 10 MG/ML IV BOLUS
INTRAVENOUS | Status: AC
  Filled 2019-06-27: qty 20

## 2019-06-27 MED ORDER — POTASSIUM CHLORIDE CRYS ER 20 MEQ PO TBCR
40.0000 meq | EXTENDED_RELEASE_TABLET | Freq: Once | ORAL | Status: AC
  Administered 2019-06-27: 40 meq via ORAL
  Filled 2019-06-27: qty 2

## 2019-06-27 MED ORDER — ONDANSETRON HCL 4 MG/2ML IJ SOLN: 4.0000 mg | Freq: Four times a day (QID) | INTRAMUSCULAR | Status: DC | PRN

## 2019-06-27 MED ORDER — SODIUM CHLORIDE 0.9 % IV BOLUS
1000.0000 mL | Freq: Once | INTRAVENOUS | Status: AC
  Administered 2019-06-27: 1000 mL via INTRAVENOUS

## 2019-06-27 MED ORDER — PROPOFOL 10 MG/ML IV BOLUS
INTRAVENOUS | Status: AC | PRN
  Administered 2019-06-27: 20 mg via INTRAVENOUS
  Administered 2019-06-27: 40 mg via INTRAVENOUS

## 2019-06-27 MED ORDER — ROSUVASTATIN CALCIUM 20 MG PO TABS
20.0000 mg | ORAL_TABLET | Freq: Every day | ORAL | Status: DC
  Administered 2019-06-28 – 2019-06-29 (×2): 20 mg via ORAL
  Filled 2019-06-27 (×2): qty 1

## 2019-06-27 MED ORDER — SODIUM CHLORIDE 0.9 % IV BOLUS
500.0000 mL | Freq: Once | INTRAVENOUS | Status: AC
  Administered 2019-06-27: 500 mL via INTRAVENOUS

## 2019-06-27 MED ORDER — AMIODARONE HCL IN DEXTROSE 360-4.14 MG/200ML-% IV SOLN
30.0000 mg/h | INTRAVENOUS | Status: DC
  Administered 2019-06-28: 30 mg/h via INTRAVENOUS
  Filled 2019-06-27 (×4): qty 200

## 2019-06-27 NOTE — ED Notes (Addendum)
MD Bensimhon aware patient has arrived. MD Pfeiffer and PA Geiple at bed side.

## 2019-06-27 NOTE — ED Notes (Signed)
Pt going to CT accompanied by RN

## 2019-06-27 NOTE — ED Provider Notes (Addendum)
[facility] EMERGENCY DEPARTMENT Provider Note   CSN: [id number] Arrival date & time: 06/27/19  1942     History   Chief Complaint Chief Complaint  Patient presents with   Loss of Consciousness   LVAD pt    HPI Johnny Oneill is a 78 y.o. male.     Patient with history of coronary artery disease, CHF, LVAD --presents to the emergency department today after an episode of syncope.  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  When he left, he was walking up a hill with another person when he suddenly lost consciousness.  Patient hit his head when he fell.  No seizure-like activity.  Patient was attended to by family and was brought to the emergency department.  He complains of a headache currently.  No confusion, nausea or vomiting, vision changes.  No weakness in the arms of the legs.  No recent diarrhea or vomiting prior to the event today.  Denies chest pain or shortness of breath.  No fevers or cough.  He is taking his medications as prescribed.     Past Medical History:  Diagnosis Date   Anxiety    Automatic implantable cardioverter-defibrillator in situ    CAD (coronary artery disease)    S/P stenting of LCX in 2004;  s/p Lat MI 2009 with occlusion of the LCX - treated with Promus stenting. Has total occlusion of the RCA.    CHF (congestive heart failure) (HCC)    Hypercholesteremia    Hypertension    Hypothyroidism    ICD (implantable cardiac defibrillator) in place    PROPHYLACTIC      medtronic   Inferior MI ([place]) "? date"; 2009   Ischemic cardiomyopathy    a. 10/09 Echo: Sev LV Dysfxn, inf/lat AK, Mod MR.   LVAD (left ventricular assist device) present ([place])    Osteoarthritis    a. s/p R TKR   Pacemaker    PVC (premature ventricular contraction)    RBBB    Shortness of breath     Patient Active Problem List   Diagnosis Date Noted   Chronic systolic  CHF (congestive heart failure) ([place]) 01/02/2015   Decreased appetite 11/01/2014   Contact dermatitis 09/11/2014   Inguinal hernia 08/21/2014   Hemothorax 02/08/2014   HTN (hypertension) 01/30/2014   Chronic anticoagulation 01/08/2014   Hypothyroid 12/20/2013   Hemothorax on right 11/06/2013   Poor venous access 11/02/2013   LVAD (left ventricular assist device) present ([place]) 09/18/2013   Weakness generalized 09/15/2013   Nonspecific (abnormal) findings on radiological and other examination of gastrointestinal tract 09/14/2013   Anemia, unspecified 09/14/2013   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   PVC (premature ventricular contraction) 12/08/2012   NSVT (nonsustained ventricular tachycardia) ([place]) 12/08/2012   Essential hypertension, benign 11/12/2010   ISCHEMIC CARDIOMYOPATHY [id number]   Chronic systolic heart failure ([place]) 11/12/2010   Ischemic cardiomyopathy    Myocardial infarction of lateral wall (HCC)    Hypercholesteremia    Automatic implantable cardioverter-defibrillator in situ    CAD (coronary artery disease)    Osteoarthritis     Past Surgical History:  Procedure Laterality Date   CARPAL TUNNEL RELEASE Right 05/29/2016   Procedure: CARPAL TUNNEL RELEASE;  Surgeon: Earlie Server, MD;  Location: [facility];  Service: Orthopedics;  Laterality: Right;   CORONARY ANGIOPLASTY WITH STENT PLACEMENT  2004   CORONARY ANGIOPLASTY WITH STENT PLACEMENT  07/2008   DEFIBRILLATOR  2009   ESOPHAGOGASTRODUODENOSCOPY N/A 09/15/2013   Procedure: ESOPHAGOGASTRODUODENOSCOPY (EGD);  Surgeon: Ladene Artist, MD;  Location: [facility] ENDOSCOPY;  Service: Endoscopy;  Laterality: N/A;  bedside   HEMATOMA EVACUATION Right 11/06/2013   Procedure: EVACUATION HEMATOMA;  Surgeon: Gaye Pollack, MD;  Location: [facility];  Service: Cardiothoracic;  Laterality: Right;   INSERT / REPLACE / REMOVE PACEMAKER  2009   INSERTION OF IMPLANTABLE LEFT VENTRICULAR ASSIST DEVICE N/A  09/18/2013   Procedure: INSERTION OF IMPLANTABLE LEFT VENTRICULAR ASSIST DEVICE;  Surgeon: Ivin Poot, MD;  Location: [facility];  Service: Open Heart Surgery;  Laterality: N/A;  CIRC ARREST  NITRIC OXIDE   INTRA-AORTIC BALLOON PUMP INSERTION N/A 09/14/2013   Procedure: INTRA-AORTIC BALLOON PUMP INSERTION;  Surgeon: Jolaine Artist, MD;  Location: [facility] CATH LAB;  Service: Cardiovascular;  Laterality: N/A;   INTRAOPERATIVE TRANSESOPHAGEAL ECHOCARDIOGRAM N/A 09/18/2013   Procedure: INTRAOPERATIVE TRANSESOPHAGEAL ECHOCARDIOGRAM;  Surgeon: Ivin Poot, MD;  Location: [facility];  Service: Open Heart Surgery;  Laterality: N/A;   KNEE ARTHROSCOPY     bilaterally   KNEE ARTHROSCOPY W/ PARTIAL MEDIAL MENISCECTOMY  12/2002   left   LEFT VENTRICULAR ASSIST DEVICE     RIGHT HEART CATHETERIZATION N/A 08/25/2013   Procedure: RIGHT HEART CATH;  Surgeon: Jolaine Artist, MD;  Location: [facility] CATH LAB;  Service: Cardiovascular;  Laterality: N/A;   RIGHT HEART CATHETERIZATION N/A 09/13/2013   Procedure: RIGHT HEART CATH;  Surgeon: Jolaine Artist, MD;  Location: [facility] CATH LAB;  Service: Cardiovascular;  Laterality: N/A;   RIGHT HEART CATHETERIZATION N/A 02/08/2014   Procedure: RIGHT HEART CATH;  Surgeon: Jolaine Artist, MD;  Location: [facility] CATH LAB;  Service: Cardiovascular;  Laterality: N/A;   RIGHT HEART CATHETERIZATION N/A 02/12/2014   Procedure: RIGHT HEART CATH;  Surgeon: Jolaine Artist, MD;  Location: [facility] CATH LAB;  Service: Cardiovascular;  Laterality: N/A;   TOTAL KNEE ARTHROPLASTY  11/27/11   left   TOTAL KNEE ARTHROPLASTY  11/27/2011   Procedure: TOTAL KNEE ARTHROPLASTY;  Surgeon: Yvette Rack., MD;  Location: [facility] [place];  Service: Orthopedics;  Laterality: Left;  left total knee arthroplasty   VIDEO ASSISTED THORACOSCOPY Right 11/06/2013   Procedure: VIDEO ASSISTED THORACOSCOPY;  Surgeon: Gaye Pollack, MD;  Location: [facility];  Service: Cardiothoracic;  Laterality: Right;        Home  Medications    Prior to Admission medications   Medication Sig Start Date End Date Taking? Authorizing Provider  acetaminophen (TYLENOL) 500 MG chewable tablet Chew 500 mg by mouth every 6 (six) hours as needed for pain.    [provider]  aspirin EC 325 MG tablet Take 325 mg by mouth daily.    [provider]  citalopram (CELEXA) 10 MG tablet Take 1 tablet by mouth once daily 06/21/19   Bensimhon, Shaune Pascal, MD  Coenzyme Q10 (COQ10) 200 MG CAPS Take 200 mg by mouth daily.    [provider]  famotidine (PEPCID) 20 MG tablet Take 20 mg by mouth 2 (two) times daily.    [provider]  furosemide (LASIX) 20 MG tablet Take 20 mg by mouth daily as needed (Take 20 mg as daily as needed for wt > 153 lbs). Reported on 01/14/2016    [provider]  hydrALAZINE (APRESOLINE) 50 MG tablet TAKE 1 TABLET BY MOUTH THREE TIMES DAILY 02/21/19   Bensimhon, Shaune Pascal, MD  levothyroxine (SYNTHROID) 137 MCG tablet Take 1 tab by mouth in the  morning (except half tablet on Sundays) on an empty stomach. 05/26/19   Elayne Snare, MD  Melatonin 3 MG TABS Take 1 tablet by mouth at bedtime.    [provider]  pantoprazole (PROTONIX) 40 MG tablet Take 1 tablet by mouth once daily 02/21/19   Bensimhon, Shaune Pascal, MD  potassium chloride SA (K-DUR,KLOR-CON) 20 MEQ tablet Take 1 tablet (20 mEq total) by mouth daily as needed. Take one potassium pill when you take your lasix. Patient not taking: Reported on 05/24/2019 01/02/15   Bensimhon, Shaune Pascal, MD  Probiotic Product (PROBIOTIC PO) Take 1 capsule by mouth daily.    [provider]  rosuvastatin (CRESTOR) 20 MG tablet Take 1 tablet by mouth once daily 03/01/19   Bensimhon, Shaune Pascal, MD  spironolactone (ALDACTONE) 25 MG tablet Take 1 tablet (25 mg total) by mouth daily. Take in the evening 11/15/17   Shirley Friar, PA-C  traZODone (DESYREL) 50 MG tablet Take 1 tablet (50 mg total) by mouth at bedtime. Patient taking  differently: Take 50 mg by mouth at bedtime as needed.  07/01/18   Bensimhon, Shaune Pascal, MD  warfarin (COUMADIN) 5 MG tablet TAKE 2 TABLETS BY MOUTH ONCE DAILY. TAKE 1 TABLET ON MONDAY AND WEDNESDAY Patient taking differently: Take 5 mg (1 tab) on Monday and Friday and 10 mg all other days 02/21/19   Bensimhon, Shaune Pascal, MD    Family History Family History  Problem Relation Age of Onset   Heart failure Father    Stroke Father    Coronary artery disease Mother    Heart disease Mother    Hyperlipidemia Sister     Social History Social History   Tobacco Use   Smoking status: Never Smoker   Smokeless tobacco: Never Used  Substance Use Topics   Alcohol use: No   Drug use: No    Types: Marijuana    Comment: "back in our younger days we smoked a little pot"     Allergies   Ace inhibitors, Diovan [valsartan], Lipitor [atorvastatin calcium], and Norvasc [amlodipine]   Review of Systems Review of Systems  Constitutional: Negative for diaphoresis and fever.  Eyes: Negative for redness.  Respiratory: Negative for cough and shortness of breath.   Cardiovascular: Negative for chest pain, palpitations and leg swelling.  Gastrointestinal: Negative for abdominal pain, nausea and vomiting.  Genitourinary: Negative for dysuria.  Musculoskeletal: Negative for back pain and neck pain.  Skin: Positive for wound. Negative for rash.  Neurological: Positive for syncope, light-headedness and headaches. Negative for facial asymmetry, speech difficulty and weakness.  Psychiatric/Behavioral: The patient is not nervous/anxious.      Physical Exam Updated Vital Signs Pulse (!) 243    Temp 98.3 F (36.8 C) (Oral)    Resp 20    SpO2 94%   Physical Exam Vitals signs and nursing note reviewed.  Constitutional:      Appearance: He is well-developed.  HENT:     Head: Normocephalic.     Comments: Small abrasion to forehead with mild swelling, no deformity. Mild posterior scalp hematoma  without laceration.  Eyes:     General:        Right eye: No discharge.        Left eye: No discharge.     Conjunctiva/sclera: Conjunctivae normal.  Neck:     Musculoskeletal: Normal range of motion and neck supple.  Cardiovascular:     Comments: No palpable pulse -- LVAD patient. + LVAD hum. Pulmonary:  Effort: Pulmonary effort is normal.     Breath sounds: Normal breath sounds.  Abdominal:     Palpations: Abdomen is soft.     Tenderness: There is no abdominal tenderness.  Skin:    General: Skin is warm and dry.     Comments: Less than 2-second capillary refill.  No pallor.  Neurological:     General: No focal deficit present.     Mental Status: He is alert and oriented to person, place, and time. Mental status is at baseline.     Comments: Normal mentation, no distress. Able to give history.       ED Treatments / Results  Labs (all labs ordered are listed, but only abnormal results are displayed) Labs Reviewed  CBC WITH DIFFERENTIAL/PLATELET - Abnormal; Notable for the following components:      Result Value   WBC 13.1 (*)    Neutro Abs 11.1 (*)    All other components within normal limits  COMPREHENSIVE METABOLIC PANEL - Abnormal; Notable for the following components:   Sodium 132 (*)    CO2 18 (*)    Glucose, Bld 143 (*)    BUN 25 (*)    Creatinine, Ser 1.90 (*)    GFR calc non Af Amer 34 (*)    GFR calc Af Amer 39 (*)    All other components within normal limits  LACTATE DEHYDROGENASE - Abnormal; Notable for the following components:   LDH 300 (*)    All other components within normal limits  PROTIME-INR - Abnormal; Notable for the following components:   Prothrombin Time 26.8 (*)    INR 2.5 (*)    All other components within normal limits  SARS CORONAVIRUS 2 (TAT 6-24 HRS)  MAGNESIUM  HAPTOGLOBIN  TYPE AND SCREEN    ED ECG REPORT   Date: 06/27/2019  Rate: 177  Rhythm: ventricular tachycardia  Narrative Interpretation: Vtach, significant  artifact  Old EKG Reviewed: changes noted  I have personally reviewed the EKG tracing and agree with the computerized printout as noted.   Radiology Ct Head Wo Contrast  Result Date: 06/27/2019 CLINICAL DATA:  Recent syncopal episode with head injury and neck pain, initial encounter EXAM: CT HEAD WITHOUT CONTRAST CT CERVICAL SPINE WITHOUT CONTRAST TECHNIQUE: Multidetector CT imaging of the head and cervical spine was performed following the standard protocol without intravenous contrast. Multiplanar CT image reconstructions of the cervical spine were also generated. COMPARISON:  09/03/2015 FINDINGS: CT HEAD FINDINGS Brain: No evidence of acute infarction, hemorrhage, hydrocephalus, extra-axial collection or mass lesion/mass effect. Vascular: No hyperdense vessel or unexpected calcification. Skull: Normal. Negative for fracture or focal lesion. Sinuses/Orbits: No acute finding. Other: None. CT CERVICAL SPINE FINDINGS Alignment: Mild straightening of the normal cervical lordosis is noted. Skull base and vertebrae: 7 cervical segments are well visualized. Vertebral body height is well maintained. Multilevel disc space narrowing and osteophytic changes are seen. Diffuse facet hypertrophic changes are noted as well. No acute fracture or acute facet abnormality is noted. Soft tissues and spinal canal: Surrounding soft tissue structures are within normal limits. No focal hematoma is seen. Upper chest: Visualized lung apices are unremarkable. Other: None IMPRESSION: CT of the head: No acute intracranial abnormality noted. CT of the cervical spine: Mild straightening of the normal cervical lordosis likely related to muscular spasm. Multilevel facet hypertrophic changes and osteophytic changes without acute abnormality. Electronically Signed   By: Inez Catalina M.D.   On: 06/27/2019 21:38   Ct Cervical Spine Wo Contrast  Result Date: 06/27/2019 CLINICAL DATA:  Recent syncopal episode with head injury and neck  pain, initial encounter EXAM: CT HEAD WITHOUT CONTRAST CT CERVICAL SPINE WITHOUT CONTRAST TECHNIQUE: Multidetector CT imaging of the head and cervical spine was performed following the standard protocol without intravenous contrast. Multiplanar CT image reconstructions of the cervical spine were also generated. COMPARISON:  09/03/2015 FINDINGS: CT HEAD FINDINGS Brain: No evidence of acute infarction, hemorrhage, hydrocephalus, extra-axial collection or mass lesion/mass effect. Vascular: No hyperdense vessel or unexpected calcification. Skull: Normal. Negative for fracture or focal lesion. Sinuses/Orbits: No acute finding. Other: None. CT CERVICAL SPINE FINDINGS Alignment: Mild straightening of the normal cervical lordosis is noted. Skull base and vertebrae: 7 cervical segments are well visualized. Vertebral body height is well maintained. Multilevel disc space narrowing and osteophytic changes are seen. Diffuse facet hypertrophic changes are noted as well. No acute fracture or acute facet abnormality is noted. Soft tissues and spinal canal: Surrounding soft tissue structures are within normal limits. No focal hematoma is seen. Upper chest: Visualized lung apices are unremarkable. Other: None IMPRESSION: CT of the head: No acute intracranial abnormality noted. CT of the cervical spine: Mild straightening of the normal cervical lordosis likely related to muscular spasm. Multilevel facet hypertrophic changes and osteophytic changes without acute abnormality. Electronically Signed   By: Inez Catalina M.D.   On: 06/27/2019 21:38    Procedures Procedures (including critical care time)  Medications Ordered in ED Medications  propofol (DIPRIVAN) 10 mg/mL bolus/IV push (has no administration in time range)  phenylephrine 0.4-0.9 MG/10ML-% injection (has no administration in time range)  sodium chloride 0.9 % bolus 500 mL (500 mLs Intravenous New Bag/Given 06/27/19 2004)  propofol (DIPRIVAN) 10 mg/mL bolus/IV push  (20 mg Intravenous Given 06/27/19 2037)  sodium chloride 0.9 % bolus 1,000 mL (1,000 mLs Intravenous New Bag/Given 06/27/19 2049)     Initial Impression / Assessment and Plan / ED Course  I have reviewed the triage vital signs and the nursing notes.  Pertinent labs & imaging results that were available during my care of the patient were reviewed by me and considered in my medical decision making (see chart for details).        Patient seen and examined. Work-up initiated. Medications ordered.   Vital signs reviewed and are as follows: Pulse (!) 243    Temp 98.3 F (36.8 C) (Oral)    Resp 20    SpO2 94%   I spoke with Dr. Haroldine Laws, discussed VT rhythm, He is coming in.  Dr. Johnney Killian has seen. Setting up for cardioversion.   Cardioversion performed by Dr. Haroldine Laws, sedation by Dr. Johnney Killian.  Patient converted back into normal sinus rhythm.  CT imaging pending.  Patient to be admitted by cardiology.  CRITICAL CARE Performed by: Carlisle Cater PA-C Total critical care time: 40 minutes Critical care time was exclusive of separately billable procedures and treating other patients. Critical care was necessary to treat or prevent imminent or life-threatening deterioration. Critical care was time spent personally by me on the following activities: development of treatment plan with patient and/or surrogate as well as nursing, discussions with consultants, evaluation of patient's response to treatment, examination of patient, obtaining history from patient or surrogate, ordering and performing treatments and interventions, ordering and review of laboratory studies, ordering and review of radiographic studies, pulse oximetry and re-evaluation of patient's condition.   Final Clinical Impressions(s) / ED Diagnoses   Final diagnoses:  Ventricular tachycardia ([place])  LVAD (left ventricular assist device) present (  [place])   Admit.   ED Discharge Orders    None       Carlisle Cater,  Hershal Coria 06/27/19 2145    Charlesetta Shanks, MD 06/28/19 0012    Carlisle Cater, PA-C 06/28/19 1721    Charlesetta Shanks, MD 07/02/19 [phone]

## 2019-06-27 NOTE — ED Triage Notes (Addendum)
Pt was reffing volleyball game around 6:30 pm and states it was too hot, walked to car to drink water and passed out. Pt hit back of head on asphalt, small laceration noted with controlled bleeding. Denies CP or other complaints. Pt takes Warfarin. MD Bensimhon told pt to come here and is requesting LDH, INR and head CT. Pt A&O x4.

## 2019-06-27 NOTE — CV Procedure (Signed)
    DIRECT CURRENT CARDIOVERSION  NAME:  Johnny Oneill   MRN: [id number] DOB:  29-Apr-1941   ADMIT DATE: 06/27/2019   INDICATIONS: VT   PROCEDURE:   Informed consent was obtained prior to the procedure. The risks, benefits and alternatives for the procedure were discussed and the patient comprehended these risks. Once an appropriate time out was taken, the patient had the defibrillator pads placed in the anterior and posterior position. The patient then underwent sedation by tDr, Pfeiffer. Once an appropriate level of sedation was achieved, the patient received a single biphasic, synchronized 200J shock with prompt conversion to sinus rhythm. No apparent complications.  Glori Bickers, MD  9:03 PM

## 2019-06-27 NOTE — ED Notes (Addendum)
Rapid response nurse Shanon Brow present.

## 2019-06-27 NOTE — H&P (Addendum)
Advanced Heart Failure VAD History and Physical Note   PCP-Cardiologist: No primary care provider on file.   Reason for Admission: VT/syncope  HPI:     Johnny Oneill is a 78 y.o. 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   Has been doing very well. Was reffing volleyball game tonight in hot gym. Felt weak then had abrupt syncopal event. [place] [address] and hit his head but got back up quickly. Wife brought to ER found to be in VT at 22 with low flows on VAD. However vitals were stable and neuro intact. MAP 90. No antecedent CP or other symptoms  In ER, I performed emergent DC-CV with the help of Dr. Johnney Killian and RRT.  Now in NSR. CT head pending.   LVAD INTERROGATION:  HeartMate II LVAD:  Flow 3.0 liters/min, speed 9200, power 4.4, PI 5.3.     Review of Systems: [y] = yes, [ ]  = no   General: Weight gain [ ] ; Weight loss [ ] ; Anorexia [ ] ; Fatigue [ ] ; Fever [ ] ; Chills [ ] ; Weakness [ ]   Cardiac: Chest pain/pressure [ ] ; Resting SOB [ ] ; Exertional SOB [ ] ; Orthopnea [ ] ; Pedal Edema [ ] ; Palpitations [ ] ; Syncope [ y]; Presyncope [ ] ; Paroxysmal nocturnal dyspnea[ ]   Pulmonary: Cough [ ] ; Wheezing[ ] ; Hemoptysis[ ] ; Sputum [ ] ; Snoring [ ]   GI: Vomiting[ ] ; Dysphagia[ ] ; Melena[ ] ; Hematochezia [ ] ; Heartburn[ ] ; Abdominal pain [ ] ; Constipation [ ] ; Diarrhea [ ] ; BRBPR [ ]   GU: Hematuria[ ] ; Dysuria [ ] ; Nocturia[ ]   Vascular: Pain in legs with walking [ ] ; Pain in feet with lying flat [ ] ; Non-healing sores [ ] ; Stroke [ ] ; TIA [ ] ; Slurred speech [ ] ;  Neuro: Headaches[ ] ; Vertigo[ ] ; Seizures[ ] ; Paresthesias[ ] ;Blurred vision [ ] ; Diplopia [ ] ; Vision changes [ ]   Ortho/Skin: Arthritis Blue.Reese ]; Joint pain Blue.Reese ]; Muscle pain [ ] ; Joint swelling [ ] ; Back Pain [ ] ; Rash [ ]   Psych: Depression[ ] ; Anxiety[ ]   Heme: Bleeding problems [ ] ; Clotting disorders [ ] ; Anemia [ ]   Endocrine: Diabetes [ ] ;  Thyroid dysfunction[ ]     Home Medications Prior to Admission medications   Medication Sig Start Date End Date Taking? Authorizing Provider  acetaminophen (TYLENOL) 500 MG chewable tablet Chew 500 mg by mouth every 6 (six) hours as needed for pain.    [provider]  aspirin EC 325 MG tablet Take 325 mg by mouth daily.    [provider]  citalopram (CELEXA) 10 MG tablet Take 1 tablet by mouth once daily 06/21/19   Jalayah Gutridge, Shaune Pascal, MD  Coenzyme Q10 (COQ10) 200 MG CAPS Take 200 mg by mouth daily.    [provider]  famotidine (PEPCID) 20 MG tablet Take 20 mg by mouth 2 (two) times daily.    [provider]  furosemide (LASIX) 20 MG tablet Take 20 mg by mouth daily as needed (Take 20 mg as daily as needed for wt > 153 lbs). Reported on 01/14/2016    [provider]  hydrALAZINE (APRESOLINE) 50 MG tablet TAKE 1 TABLET BY MOUTH THREE TIMES DAILY 02/21/19   Ervin Hensley, Shaune Pascal, MD  levothyroxine (SYNTHROID) 137 MCG tablet Take 1 tab by mouth in the morning (except half tablet on Sundays) on an empty stomach. 05/26/19   Elayne Snare, MD  Melatonin  3 MG TABS Take 1 tablet by mouth at bedtime.    [provider]  pantoprazole (PROTONIX) 40 MG tablet Take 1 tablet by mouth once daily 02/21/19   Samule Life, Shaune Pascal, MD  potassium chloride SA (K-DUR,KLOR-CON) 20 MEQ tablet Take 1 tablet (20 mEq total) by mouth daily as needed. Take one potassium pill when you take your lasix. Patient not taking: Reported on 05/24/2019 01/02/15   Kailee Essman, Shaune Pascal, MD  Probiotic Product (PROBIOTIC PO) Take 1 capsule by mouth daily.    [provider]  rosuvastatin (CRESTOR) 20 MG tablet Take 1 tablet by mouth once daily 03/01/19   Xavior Niazi, Shaune Pascal, MD  spironolactone (ALDACTONE) 25 MG tablet Take 1 tablet (25 mg total) by mouth daily. Take in the evening 11/15/17   Shirley Friar, PA-C  traZODone (DESYREL) 50 MG tablet Take 1 tablet (50 mg total) by  mouth at bedtime. Patient taking differently: Take 50 mg by mouth at bedtime as needed.  07/01/18   Marcia Lepera, Shaune Pascal, MD  warfarin (COUMADIN) 5 MG tablet TAKE 2 TABLETS BY MOUTH ONCE DAILY. TAKE 1 TABLET ON MONDAY AND WEDNESDAY Patient taking differently: Take 5 mg (1 tab) on Monday and Friday and 10 mg all other days 02/21/19   Selenne Coggin, Shaune Pascal, MD    Past Medical History: Past Medical History:  Diagnosis Date  . Anxiety   . Automatic implantable cardioverter-defibrillator in situ   . CAD (coronary artery disease)    S/P stenting of LCX in 2004;  s/p Lat MI 2009 with occlusion of the LCX - treated with Promus stenting. Has total occlusion of the RCA.   [name] CHF (congestive heart failure) ([place])   . Hypercholesteremia   . Hypertension   . Hypothyroidism   . ICD (implantable cardiac defibrillator) in place    PROPHYLACTIC      medtronic  . Inferior MI ([place]) "? date"; 2009  . Ischemic cardiomyopathy    a. 10/09 Echo: Sev LV Dysfxn, inf/lat AK, Mod MR.  [name] LVAD (left ventricular assist device) present ([place])   . Osteoarthritis    a. s/p R TKR  . Pacemaker   . PVC (premature ventricular contraction)   . RBBB   . Shortness of breath     Past Surgical History: Past Surgical History:  Procedure Laterality Date  . CARPAL TUNNEL RELEASE Right 05/29/2016   Procedure: CARPAL TUNNEL RELEASE;  Surgeon: Earlie Server, MD;  Location: [place];  Service: Orthopedics;  Laterality: Right;  . CORONARY ANGIOPLASTY WITH STENT PLACEMENT  2004  . CORONARY ANGIOPLASTY WITH STENT PLACEMENT  07/2008  . DEFIBRILLATOR  2009  . ESOPHAGOGASTRODUODENOSCOPY N/A 09/15/2013   Procedure: ESOPHAGOGASTRODUODENOSCOPY (EGD);  Surgeon: Ladene Artist, MD;  Location: [facility] ENDOSCOPY;  Service: Endoscopy;  Laterality: N/A;  bedside  . HEMATOMA EVACUATION Right 11/06/2013   Procedure: EVACUATION HEMATOMA;  Surgeon: Gaye Pollack, MD;  Location: [place];  Service: Cardiothoracic;  Laterality: Right;  . INSERT / REPLACE / REMOVE  PACEMAKER  2009  . INSERTION OF IMPLANTABLE LEFT VENTRICULAR ASSIST DEVICE N/A 09/18/2013   Procedure: INSERTION OF IMPLANTABLE LEFT VENTRICULAR ASSIST DEVICE;  Surgeon: Ivin Poot, MD;  Location: [place];  Service: Open Heart Surgery;  Laterality: N/A;  CIRC ARREST  NITRIC OXIDE  . INTRA-AORTIC BALLOON PUMP INSERTION N/A 09/14/2013   Procedure: INTRA-AORTIC BALLOON PUMP INSERTION;  Surgeon: Jolaine Artist, MD;  Location: [facility] CATH LAB;  Service: Cardiovascular;  Laterality: N/A;  . INTRAOPERATIVE TRANSESOPHAGEAL ECHOCARDIOGRAM N/A  09/18/2013   Procedure: INTRAOPERATIVE TRANSESOPHAGEAL ECHOCARDIOGRAM;  Surgeon: Ivin Poot, MD;  Location: [place];  Service: Open Heart Surgery;  Laterality: N/A;  . KNEE ARTHROSCOPY     bilaterally  . KNEE ARTHROSCOPY W/ PARTIAL MEDIAL MENISCECTOMY  12/2002   left  . LEFT VENTRICULAR ASSIST DEVICE    . RIGHT HEART CATHETERIZATION N/A 08/25/2013   Procedure: RIGHT HEART CATH;  Surgeon: Jolaine Artist, MD;  Location[facility] C[facility] LAB;  Service: Cardiovascular;  Laterality: N/A;  . RIGHT HEART CATHETERIZATION N/A 09/13/2013   Procedure: RIGHT HEART CATH;  Surgeon: Jolaine Artist, MD;  Location: [facility] CATH LAB;  Service: Cardiovascular;  Laterality: N/A;  . RIGHT HEART CATHETERIZATION N/A 02/08/2014   Procedure: RIGHT HEART CATH;  Surgeon: Jolaine Artist, MD;  Location: [facility] CATH LAB;  Service: Cardiovascular;  Laterality: N/A;  . RIGHT HEART CATHETERIZATION N/A 02/12/2014   Procedure: RIGHT HEART CATH;  Surgeon: Jolaine Artist, MD;  Location: [facility] CATH LAB;  Service: Cardiovascular;  Laterality: N/A;  . TOTAL KNEE ARTHROPLASTY  11/27/11   left  . TOTAL KNEE ARTHROPLASTY  11/27/2011   Procedure: TOTAL KNEE ARTHROPLASTY;  Surgeon: Yvette Rack., MD;  Location: [place];  Service: Orthopedics;  Laterality: Left;  left total knee arthroplasty  . VIDEO ASSISTED THORACOSCOPY Right 11/06/2013   Procedure: VIDEO ASSISTED THORACOSCOPY;  Surgeon: Gaye Pollack, MD;   Location: [facility] OR;  Service: Cardiothoracic;  Laterality: Right;    Family History: Family History  Problem Relation Age of Onset  . Heart failure Father   . Stroke Father   . Coronary artery disease Mother   . Heart disease Mother   . Hyperlipidemia Sister     Social History: Social History   Socioeconomic History  . Marital status: Married    Spouse name: Not on file  . Number of children: 1  . Years of education: Not on file  . Highest education level: Not on file  Occupational History  . Occupation: Music therapist: RETIRED  . Occupation: [employer]  . Financial resource strain: Not on file  . Food insecurity    Worry: Not on file    Inability: Not on file  . Transportation needs    Medical: Not on file    Non-medical: Not on file  Tobacco Use  . Smoking status: Never Smoker  . Smokeless tobacco: Never Used  Substance and Sexual Activity  . Alcohol use: No  . Drug use: No    Types: Marijuana    Comment: "back in our younger days we smoked a little pot"  . Sexual activity: Not Currently  Lifestyle  . Physical activity    Days per week: Not on file    Minutes per session: Not on file  . Stress: Not on file  Relationships  . Social Herbalist on phone: Not on file    Gets together: Not on file    Attends religious service: Not on file    Active member of club or organization: Not on file    Attends meetings of clubs or organizations: Not on file    Relationship status: Not on file  Other Topics Concern  . Not on file  Social History Narrative  . Not on file    Allergies:  Allergies  Allergen Reactions  . Ace Inhibitors     Hypotension and elevated creatinine  . Diovan [Valsartan] Other (See Comments)  Hypotension at low dose, hyperkalemia  . Lipitor [Atorvastatin Calcium] Other (See Comments)    Muscle pain  . Norvasc [Amlodipine] Other (See Comments)    Put patient to sleep    Objective:    Vital Signs:    Temp:  [98.3 F (36.8 C)] 98.3 F (36.8 C) (10/27 1953) Pulse Rate:  [65-243] 65 (10/27 12-15-2040) Resp:  [20-27] 27 (10/27 Dec 15, 2040) SpO2:  [94 %-100 %] 97 % (10/27 2043-12-16)   There were no vitals filed for this visit.  Mean arterial Pressure 90  Physical Exam    General:  Well appearing. No resp difficulty HEENT: quarter size wound with surrounding ecchymosis on back of head  Mild ecchymosis under left eye Neck: supple. JVP flat . Carotids 2+ bilat; no bruits. No lymphadenopathy or thyromegaly appreciated. Cor: Mechanical heart sounds with LVAD hum present. Lungs: Clear Abdomen: soft, nontender, nondistended. No hepatosplenomegaly. No bruits or masses. Good bowel sounds. Driveline: C/D/I; securement device intact and driveline incorporated Extremities: no cyanosis, clubbing, rash, edema Neuro: alert & orientedx3, cranial nerves grossly intact. moves all 4 extremities w/o difficulty. Affect pleasant   Telemetry   VT ~ 240 bpm  Labs    Basic Metabolic Panel: Recent Labs  Lab 06/27/19 12/16/2002  NA 132*  K 3.9  CL 100  CO2 18*  GLUCOSE 143*  BUN 25*  CREATININE 1.90*  CALCIUM 9.2    Liver Function Tests: Recent Labs  Lab 06/27/19 2002/12/16  AST 33  ALT 22  ALKPHOS 66  BILITOT 1.0  PROT 7.2  ALBUMIN 4.4   No results for input(s): LIPASE, AMYLASE in the last 168 hours. No results for input(s): AMMONIA in the last 168 hours.  CBC: Recent Labs  Lab 06/27/19 December 16, 2002  WBC 13.1*  NEUTROABS 11.1*  HGB 14.5  HCT 46.2  MCV 94.9  PLT 192    Cardiac Enzymes: No results for input(s): CKTOTAL, CKMB, CKMBINDEX, TROPONINI in the last 168 hours.  BNP: BNP (last 3 results) No results for input(s): BNP in the last 8760 hours.  ProBNP (last 3 results) No results for input(s): PROBNP in the last 8760 hours.   CBG: No results for input(s): GLUCAP in the last 168 hours.  Coagulation Studies: Recent Labs    06/27/19 December 16, 2002  LABPROT 26.8*  INR 2.5*    Other results: EKG: VT  at 240 Personally reviewed    Imaging     No results found.     Assessment/Plan:     1. Ventricular tachycardia -> syncope - on presentation in VT at 240.  - emergent DC-CV in ER. Now back in NSR.  - ? Suction event. - check K & Mg - start amiodarone - likely will need ICD replaced. NPO after MN  2. Head trauma - stat CT  3. Chronic systolic HF s/p VAD - VAD interrogated personally.  - low flow in setting of VT - now improved after DC-CV - INR 2.5. May need to reverse depending on results of CT. No coumadin tonight.  I reviewed the LVAD parameters from today, and compared the results to the patient's prior recorded data.  No programming changes were made.  The LVAD is functioning within specified parameters.  The patient performs LVAD self-test daily.  LVAD interrogation was negative for any significant power changes, alarms or PI events/speed drops.  LVAD equipment check completed and is in good working order.  Back-up equipment present.   LVAD education done on emergency procedures and precautions and reviewed exit site  care.  Length of Stay: 0  Glori Bickers, MD 06/27/2019, 8:55 PM  VAD Team Pager [phone] (7am - 7am) +++VAD ISSUES ONLY+++   Advanced Heart Failure Team Pager [phone] (M-F; [place])  Please contact [facility] for night-coverage after hours (4p -7a ) and weekends on [email or web address] for all non- LVAD Issues  Addendum. Head CT negative for bleed.   Glori Bickers, MD  9:42 PM

## 2019-06-27 NOTE — ED Notes (Addendum)
MD Bensimhon called report to floor and told this RN he was taking pt upstairs and that this RN did not need to call report.

## 2019-06-28 ENCOUNTER — Encounter (HOSPITAL_COMMUNITY): Payer: Self-pay | Admitting: Unknown Physician Specialty

## 2019-06-28 ENCOUNTER — Inpatient Hospital Stay (HOSPITAL_COMMUNITY)
Admission: EM | Disposition: A | Discharge status: Home / Self Care | Payer: Self-pay | Source: Home / Self Care | Attending: Internal Medicine

## 2019-06-28 ENCOUNTER — Other Ambulatory Visit: Payer: Self-pay

## 2019-06-28 ENCOUNTER — Encounter (HOSPITAL_COMMUNITY): Payer: Self-pay | Admitting: *Deleted

## 2019-06-28 ENCOUNTER — Inpatient Hospital Stay (HOSPITAL_COMMUNITY): Payer: Medicare Other

## 2019-06-28 DIAGNOSIS — I472 Ventricular tachycardia: Secondary | ICD-10-CM | POA: Diagnosis not present

## 2019-06-28 DIAGNOSIS — I340 Nonrheumatic mitral (valve) insufficiency: Secondary | ICD-10-CM

## 2019-06-28 DIAGNOSIS — N179 Acute kidney failure, unspecified: Secondary | ICD-10-CM

## 2019-06-28 DIAGNOSIS — Z4502 Encounter for adjustment and management of automatic implantable cardiac defibrillator: Secondary | ICD-10-CM

## 2019-06-28 DIAGNOSIS — I10 Essential (primary) hypertension: Secondary | ICD-10-CM

## 2019-06-28 HISTORY — PX: ICD GENERATOR CHANGEOUT: EP1231

## 2019-06-28 LAB — CBC
HCT: 39 % (ref 39.0–52.0)
Hemoglobin: 12.9 g/dL — ABNORMAL LOW (ref 13.0–17.0)
MCH: 30.1 pg (ref 26.0–34.0)
MCHC: 33.1 g/dL (ref 30.0–36.0)
MCV: 91.1 fL (ref 80.0–100.0)
Platelets: 150 10*3/uL (ref 150–400)
RBC: 4.28 MIL/uL (ref 4.22–5.81)
RDW: 14.8 % (ref 11.5–15.5)
WBC: 6.2 10*3/uL (ref 4.0–10.5)
nRBC: 0 % (ref 0.0–0.2)

## 2019-06-28 LAB — PROTIME-INR
INR: 3 — ABNORMAL HIGH (ref 0.8–1.2)
Prothrombin Time: 30.8 seconds — ABNORMAL HIGH (ref 11.4–15.2)

## 2019-06-28 LAB — SURGICAL PCR SCREEN
MRSA, PCR: NEGATIVE
Staphylococcus aureus: NEGATIVE

## 2019-06-28 LAB — ECHOCARDIOGRAM COMPLETE
Height: 67 in
Weight: 2585.55 oz

## 2019-06-28 LAB — BASIC METABOLIC PANEL
Anion gap: 8 (ref 5–15)
BUN: 20 mg/dL (ref 8–23)
CO2: 22 mmol/L (ref 22–32)
Calcium: 8.7 mg/dL — ABNORMAL LOW (ref 8.9–10.3)
Chloride: 108 mmol/L (ref 98–111)
Creatinine, Ser: 1.5 mg/dL — ABNORMAL HIGH (ref 0.61–1.24)
GFR calc Af Amer: 52 mL/min — ABNORMAL LOW (ref >60)
GFR calc non Af Amer: 45 mL/min — ABNORMAL LOW (ref >60)
Glucose, Bld: 127 mg/dL — ABNORMAL HIGH (ref 70–99)
Potassium: 3.9 mmol/L (ref 3.5–5.1)
Sodium: 138 mmol/L (ref 135–145)

## 2019-06-28 LAB — SARS CORONAVIRUS 2 (TAT 6-24 HRS): SARS Coronavirus 2: NEGATIVE

## 2019-06-28 LAB — MRSA PCR SCREENING: MRSA by PCR: NEGATIVE

## 2019-06-28 LAB — LACTATE DEHYDROGENASE: LDH: 192 U/L (ref 98–192)

## 2019-06-28 SURGERY — ICD GENERATOR CHANGEOUT

## 2019-06-28 MED ORDER — POTASSIUM CHLORIDE CRYS ER 20 MEQ PO TBCR
40.0000 meq | EXTENDED_RELEASE_TABLET | Freq: Once | ORAL | Status: AC
  Administered 2019-06-28: 40 meq via ORAL
  Filled 2019-06-28: qty 2

## 2019-06-28 MED ORDER — MIDAZOLAM HCL 5 MG/5ML IJ SOLN
INTRAMUSCULAR | Status: AC
  Filled 2019-06-28: qty 5

## 2019-06-28 MED ORDER — MIDAZOLAM HCL 5 MG/5ML IJ SOLN
INTRAMUSCULAR | Status: DC | PRN
  Administered 2019-06-28 (×2): 1 mg via INTRAVENOUS

## 2019-06-28 MED ORDER — MELATONIN 3 MG PO TABS
6.0000 mg | ORAL_TABLET | Freq: Every day | ORAL | Status: DC
  Administered 2019-06-28: 6 mg via ORAL
  Filled 2019-06-28 (×2): qty 2

## 2019-06-28 MED ORDER — SODIUM CHLORIDE 0.9 % IV SOLN
80.0000 mg | INTRAVENOUS | Status: DC
  Filled 2019-06-28: qty 2

## 2019-06-28 MED ORDER — FENTANYL CITRATE (PF) 100 MCG/2ML IJ SOLN
INTRAMUSCULAR | Status: DC | PRN
  Administered 2019-06-28 (×2): 25 ug via INTRAVENOUS

## 2019-06-28 MED ORDER — ACETAMINOPHEN 325 MG PO TABS: 325.0000 mg | ORAL_TABLET | ORAL | Status: DC | PRN

## 2019-06-28 MED ORDER — HYDRALAZINE HCL 50 MG PO TABS
50.0000 mg | ORAL_TABLET | Freq: Three times a day (TID) | ORAL | Status: DC
  Administered 2019-06-28 – 2019-06-29 (×3): 50 mg via ORAL
  Filled 2019-06-28 (×3): qty 1

## 2019-06-28 MED ORDER — MUPIROCIN 2 % EX OINT
TOPICAL_OINTMENT | CUTANEOUS | Status: AC
  Filled 2019-06-28: qty 22

## 2019-06-28 MED ORDER — CEFAZOLIN SODIUM-DEXTROSE 2-4 GM/100ML-% IV SOLN
2.0000 g | INTRAVENOUS | Status: AC
  Administered 2019-06-28: 2 g via INTRAVENOUS
  Filled 2019-06-28: qty 100

## 2019-06-28 MED ORDER — FENTANYL CITRATE (PF) 100 MCG/2ML IJ SOLN
INTRAMUSCULAR | Status: AC
  Filled 2019-06-28: qty 2

## 2019-06-28 MED ORDER — ONDANSETRON HCL 4 MG/2ML IJ SOLN: 4.0000 mg | Freq: Four times a day (QID) | INTRAMUSCULAR | Status: DC | PRN

## 2019-06-28 MED ORDER — SODIUM CHLORIDE 0.9 % IV SOLN
INTRAVENOUS | Status: DC
  Administered 2019-06-28: 12:00:00 via INTRAVENOUS

## 2019-06-28 MED ORDER — LIDOCAINE HCL (PF) 1 % IJ SOLN
INTRAMUSCULAR | Status: AC
  Filled 2019-06-28: qty 60

## 2019-06-28 MED ORDER — CEFAZOLIN SODIUM-DEXTROSE 2-4 GM/100ML-% IV SOLN
INTRAVENOUS | Status: AC
  Filled 2019-06-28: qty 100

## 2019-06-28 MED ORDER — SODIUM CHLORIDE 0.9 % IV SOLN
INTRAVENOUS | Status: AC
  Filled 2019-06-28: qty 2

## 2019-06-28 MED ORDER — CEFAZOLIN SODIUM-DEXTROSE 1-4 GM/50ML-% IV SOLN
1.0000 g | Freq: Three times a day (TID) | INTRAVENOUS | Status: DC
  Administered 2019-06-28 – 2019-06-29 (×2): 1 g via INTRAVENOUS
  Filled 2019-06-28 (×3): qty 50

## 2019-06-28 SURGICAL SUPPLY — 7 items
CABLE SURGICAL S-101-97-12 (CABLE) ×2 IMPLANT
DEVICE DISSECT PLASMABLAD 3.0S (MISCELLANEOUS) ×1 IMPLANT
ICD EVERA XT MRI DF1  DDMB1D1 (ICD Generator) ×1 IMPLANT
ICD EVERA XT MRI DF1 DDMB1D1 (ICD Generator) ×1 IMPLANT
PAD PRO RADIOLUCENT 2001M-C (PAD) ×2 IMPLANT
PLASMABLADE 3.0S (MISCELLANEOUS) ×2
TRAY PACEMAKER INSERTION (PACKS) ×2 IMPLANT

## 2019-06-28 NOTE — Discharge Instructions (Signed)
Implantable Cardiac Device Battery Change, Care After °This sheet gives you information about how to care for yourself after your procedure. Your health care provider may also give you more specific instructions. If you have problems or questions, contact your health care provider. °What can I expect after the procedure? °After your procedure, it is common to have: °· Pain or soreness at the site where the cardiac device was inserted. °· Swelling at the site where the cardiac device was inserted. °· You should received an information card for your new device in 4-8 weeks. °Follow these instructions at home: °Incision care  °· Keep the incision clean and dry. °? Do not take baths, swim, or use a hot tub until after your wound check.  °? You may shower the day after your procedure, or as directed by your health care provider. °? Pat the area dry with a clean towel. Do not rub the area. This may cause bleeding. °· Follow instructions from your health care provider about how to take care of your incision. Make sure you: °? Leave stitches (sutures), skin glue, or adhesive strips in place. These skin closures may need to stay in place for 2 weeks or longer. If adhesive strip edges start to loosen and curl up, you may trim the loose edges. Do not remove adhesive strips completely unless your health care provider tells you to do that. °· Check your incision area every day for signs of infection. Check for: °? More redness, swelling, or pain. °? More fluid or blood. °? Warmth. °? Pus or a bad smell. °Activity °· Do not lift anything that is heavier than 10 lb (4.5 kg) until your health care provider says it is okay to do so. °· For the first week, or as long as told by your health care provider: °? Avoid lifting your affected arm higher than your shoulder. °? After 1 week, Be gentle when you move your arms over your head. It is okay to raise your arm to comb your hair. °? Avoid strenuous exercise. °· Ask your health care  provider when it is okay to: °? Resume your normal activities. °? Return to work or school. °? Resume sexual activity. °Eating and drinking °· Eat a heart-healthy diet. This should include plenty of fresh fruits and vegetables, whole grains, low-fat dairy products, and lean protein like chicken and fish. °· Limit alcohol intake to no more than 1 drink a day for non-pregnant women and 2 drinks a day for men. One drink equals 12 oz of beer, 5 oz of wine, or 1½ oz of hard liquor. °· Check ingredients and nutrition facts on packaged foods and beverages. Avoid the following types of food: °? Food that is high in salt (sodium). °? Food that is high in saturated fat, like full-fat dairy or red meat. °? Food that is high in trans fat, like fried food. °? Food and drinks that are high in sugar. °Lifestyle °· Do not use any products that contain nicotine or tobacco, such as cigarettes and e-cigarettes. If you need help quitting, ask your health care provider. °· Take steps to manage and control your weight. °· Once cleared, get regular exercise. Aim for 150 minutes of moderate-intensity exercise (such as walking or yoga) or 75 minutes of vigorous exercise (such as running or swimming) each week. °· Manage other health problems, such as diabetes or high blood pressure. Ask your health care provider how you can manage these conditions. °General instructions °· Do not   drive for 24 hours after your procedure if you were given a medicine to help you relax (sedative). °· Take over-the-counter and prescription medicines only as told by your health care provider. °· Avoid putting pressure on the area where the cardiac device was placed. °· If you need an MRI after your cardiac device has been placed, be sure to tell the health care provider who orders the MRI that you have a cardiac device. °· Avoid close and prolonged exposure to electrical devices that have strong magnetic fields. These include: °? Cell phones. Avoid keeping them  in a pocket near the cardiac device, and try using the ear opposite the cardiac device. °? MP3 players. °? Household appliances, like microwaves. °? Metal detectors. °? Electric generators. °? High-tension wires. °· Keep all follow-up visits as directed by your health care provider. This is important. °Contact a health care provider if: °· You have pain at the incision site that is not relieved by over-the-counter or prescription medicines. °· You have any of these around your incision site or coming from it: °? More redness, swelling, or pain. °? Fluid or blood. °? Warmth to the touch. °? Pus or a bad smell. °· You have a fever. °· You feel brief, occasional palpitations, light-headedness, or any symptoms that you think might be related to your heart. °Get help right away if: °· You experience chest pain that is different from the pain at the cardiac device site. °· You develop a red streak that extends above or below the incision site. °· You experience shortness of breath. °· You have palpitations or an irregular heartbeat. °· You have light-headedness that does not go away quickly. °· You faint or have dizzy spells. °· Your pulse suddenly drops or increases rapidly and does not return to normal. °· You begin to gain weight and your legs and ankles swell. °Summary °· After your procedure, it is common to have pain, soreness, and some swelling where the cardiac device was inserted. °· Make sure to keep your incision clean and dry. Follow instructions from your health care provider about how to take care of your incision. °· Check your incision every day for signs of infection, such as more pain or swelling, pus or a bad smell, warmth, or leaking fluid and blood. °· Avoid strenuous exercise and lifting your left arm higher than your shoulder for 2 weeks, or as long as told by your health care provider. °This information is not intended to replace advice given to you by your health care provider. Make sure you  discuss any questions you have with your health care provider. °

## 2019-06-28 NOTE — Plan of Care (Signed)

## 2019-06-28 NOTE — Plan of Care (Signed)
  Problem: Education: Goal: Knowledge of General Education information will improve Description: Including pain rating scale, medication(s)/side effects and non-pharmacologic comfort measures Outcome: Progressing   Problem: Clinical Measurements: Goal: Ability to maintain clinical measurements within normal limits will improve Outcome: Progressing Goal: Respiratory complications will improve Outcome: Progressing Goal: Cardiovascular complication will be avoided Outcome: Progressing   

## 2019-06-28 NOTE — Progress Notes (Signed)
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 He hit the back of his head. He contacted Dr. Haroldine Laws and was told to come to the ED. Upon arrival, Johnny Oneill was alert, oriented x4 and in good spirits. Skin was warm pink and dry. Doppled SBP initially was 98. No distress, CP or SOB. EKG showed VT 224-245. LVAD attached to wall [facility] monitor. 9200 rpm, 2.9-3.1 LPM flow. NS bolus 500 cc initiated. A few PI events from approximately 1810 noted.  Dr. Haroldine Laws notified. Preparing patient for a synchronized cardioversion with Dr. Ermalinda Memos and Dr. Haroldine Laws. Pts wife at bedside.

## 2019-06-28 NOTE — Progress Notes (Addendum)
LVAD Coordinator Rounding Note:  Admitted 06/27/19 due to VT.   HMII LVAD implanted on 09/19/13.  Pt sitting up bed in good spirits. 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 He remembers walking outside and starting up a hill and then woke up on the ground with people calling his name. Pt has not had any further VT overnight. Will plan for a generator change out this admission.   Vital signs: HR: 60 Doppler Pressure: 106 Automatic BP: 108/96 (101) O2 Sat: 97 Wt: 161lbs   LVAD interrogation reveals:   Speed: 9200 Flow: 3.4 Power:  4.0 PI: 7.4 Alarms: none Events:  none  Fixed speed: 9200 Low speed limit: 8600   Drive Line: Existing VAD dressing removed and site care performed using sterile technique. Drive line exit site cleaned with saline x 2, allowed to dry, and Sorbaview dressing with bio patch re-applied. Exit site healed and incorporated, the velour is fully implanted at exit site. No redness, tenderness, drainage, foul odor or rash noted. Drive line anchor secure. Next dressing change due 07/05/19.  Labs:  LDH trend: 192  INR trend: 3.0  Anticoagulation Plan: -INR Goal: 2.5-3.0 -ASA Dose: holding  Device: -Medtronic -Therapies: off-device at ERI  Arrythmias: VT on admission pt currently on IV Amiodarone @ 30   Adverse Events on VAD: -06/27/19-VT pt sustained fall-CT head negative  Plan/Recommendations:   1. VAD coordinator will change driveline dressing this afternoon. 2. Please page VAD coordinator for any equipment issues or driveline questions. 3. VAD coordinator will accompany pt to EP lab this evening.   Tanda Rockers RN, BSN VAD Coordinator 24/7 Pager [phone]

## 2019-06-28 NOTE — Progress Notes (Signed)
  Echocardiogram 2D Echocardiogram has been performed.  Johny Chess 06/28/2019, 11:32 AM

## 2019-06-28 NOTE — ED Provider Notes (Signed)
Medical screening examination/treatment/procedure(s) were conducted as a shared visit with non-physician practitioner(s) and myself.  I personally evaluated the patient during the encounter.    .Sedation  Date/Time: 06/28/2019 12:12 AM Performed by: Charlesetta Shanks, MD Authorized by: Charlesetta Shanks, MD   Consent:    Consent obtained:  Verbal   Consent given by:  Patient   Risks discussed:  Allergic reaction, dysrhythmia, inadequate sedation, nausea, prolonged hypoxia resulting in organ damage, prolonged sedation necessitating reversal, respiratory compromise necessitating ventilatory assistance and intubation and vomiting   Alternatives discussed:  Analgesia without sedation, anxiolysis and regional anesthesia Universal protocol:    Procedure explained and questions answered to patient or proxy's satisfaction: yes     Relevant documents present and verified: yes     Test results available and properly labeled: yes     Imaging studies available: yes     Required blood products, implants, devices, and special equipment available: yes     Site/side marked: yes     Immediately prior to procedure a time out was called: yes     Patient identity confirmation method:  Verbally with patient Indications:    Procedure necessitating sedation performed by:  Different physician Pre-sedation assessment:    Time since last food or drink:  12   NPO status caution: urgency dictates proceeding with non-ideal NPO status     ASA classification: class 4 - patient with severe systemic disease that is a constant threat to life     Neck mobility: normal     Mouth opening:  3 or more finger widths   Thyromental distance:  4 finger widths   Mallampati score:  II - soft palate, uvula, fauces visible   Pre-sedation assessments completed and reviewed: airway patency, cardiovascular function, hydration status, mental status, nausea/vomiting, pain level, respiratory function and temperature   Immediate  pre-procedure details:    Reassessment: Patient reassessed immediately prior to procedure     Reviewed: vital signs, relevant labs/tests and NPO status     Verified: bag valve mask available, emergency equipment available, intubation equipment available, IV patency confirmed, oxygen available and suction available   Procedure details (see MAR for exact dosages):    Preoxygenation:  Nasal cannula   Sedation:  Propofol   Intended level of sedation: deep   Intra-procedure monitoring:  Blood pressure monitoring, cardiac monitor, continuous pulse oximetry, frequent LOC assessments, frequent vital sign checks and continuous capnometry   Intra-procedure events: none     Total Provider sedation time (minutes):  10 Post-procedure details:    Attendance: Constant attendance by certified staff until patient recovered     Recovery: Patient returned to pre-procedure baseline     Post-sedation assessments completed and reviewed: airway patency, cardiovascular function, hydration status, mental status, nausea/vomiting, pain level, respiratory function and temperature     Patient is stable for discharge or admission: yes     Patient tolerance:  Tolerated well, no immediate complications     Charlesetta Shanks, MD 06/28/19 [phone]

## 2019-06-28 NOTE — Consult Note (Addendum)
ELECTROPHYSIOLOGY CONSULT NOTE    Patient ID: Johnny Oneill MRN: [id number], DOB/AGE: [birth year] 78 y.o.  Admit date: 06/27/2019 Date of Consult: 06/28/2019  Primary Physician: Lavone Orn, MD Primary Cardiologist: Dr. Haroldine Laws Electrophysiologist: Dr. Rayann Heman  Referring Provider: Dr. Haroldine Laws   Patient Profile: Johnny Oneill is a 78 y.o. 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 Haroldine Laws.  HPI:  Johnny Oneill is a 78 y.o. male with medical history and LVAD as above. He presented to [facility] 06/27/2019 in the setting of syncope. 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 Water fountains were closed due to COVID restrictions. He walked up a flight of steps and was walking uphill when he felt weak/lightheaded. He tried to catch himself by grabbing on to a "pole" but ultimately lost consciousness   In the ED he presented in VT at 240 bpm with low flows on his VAD. Vitals and neuro were intact, MAP 90. He underwent emergent DCCV by Dr. Haroldine Laws and Dr. Johnney Killian with RRT.  He has remained in NSR since, and was started on amiodarone.   CT head with no acute abnormalities. CT of the C-spine with chronic changes related to muscle spasm/lordosis. No acute changes.   He denies chest pain, palpitations, dyspnea, PND, orthopnea, nausea, vomiting, dizziness, syncope, edema, weight gain, or early satiety at this time. He has had no further VT.   Past Medical History:  Diagnosis Date  . Anxiety   . Automatic implantable cardioverter-defibrillator in situ   . CAD (coronary artery disease)    S/P stenting of LCX in 2004;  s/p Lat MI 2009 with occlusion of the LCX - treated with Promus stenting. Has total occlusion of the RCA.   [name] CHF (congestive heart failure) ([place])   . Hypercholesteremia   . Hypertension   . Hypothyroidism   . ICD (implantable  cardiac defibrillator) in place    PROPHYLACTIC      medtronic  . Inferior MI ([place]) "? date"; 2009  . Ischemic cardiomyopathy    a. 10/09 Echo: Sev LV Dysfxn, inf/lat AK, Mod MR.  [name] LVAD (left ventricular assist device) present ([place])   . Osteoarthritis    a. s/p R TKR  . Pacemaker   . PVC (premature ventricular contraction)   . RBBB   . Shortness of breath      Surgical History:  Past Surgical History:  Procedure Laterality Date  . CARPAL TUNNEL RELEASE Right 05/29/2016   Procedure: CARPAL TUNNEL RELEASE;  Surgeon: Earlie Server, MD;  Location: [place];  Service: Orthopedics;  Laterality: Right;  . CORONARY ANGIOPLASTY WITH STENT PLACEMENT  2004  . CORONARY ANGIOPLASTY WITH STENT PLACEMENT  07/2008  . DEFIBRILLATOR  2009  . ESOPHAGOGASTRODUODENOSCOPY N/A 09/15/2013   Procedure: ESOPHAGOGASTRODUODENOSCOPY (EGD);  Surgeon: Ladene Artist, MD;  Location: [facility] ENDOSCOPY;  Service: Endoscopy;  Laterality: N/A;  bedside  . HEMATOMA EVACUATION Right 11/06/2013   Procedure: EVACUATION HEMATOMA;  Surgeon: Gaye Pollack, MD;  Location: [place];  Service: Cardiothoracic;  Laterality: Right;  . INSERT / REPLACE / REMOVE PACEMAKER  2009  . INSERTION OF IMPLANTABLE LEFT VENTRICULAR ASSIST DEVICE N/A 09/18/2013   Procedure: INSERTION OF IMPLANTABLE LEFT VENTRICULAR ASSIST DEVICE;  Surgeon: Ivin Poot, MD;  Location: [place];  Service: Open Heart Surgery;  Laterality: N/A;  CIRC  ARREST  NITRIC OXIDE  . INTRA-AORTIC BALLOON PUMP INSERTION N/A 09/14/2013   Procedure: INTRA-AORTIC BALLOON PUMP INSERTION;  Surgeon: Jolaine Artist, MD;  Location: [facility] CATH LAB;  Service: Cardiovascular;  Laterality: N/A;  . INTRAOPERATIVE TRANSESOPHAGEAL ECHOCARDIOGRAM N/A 09/18/2013   Procedure: INTRAOPERATIVE TRANSESOPHAGEAL ECHOCARDIOGRAM;  Surgeon: Ivin Poot, MD;  Location: [place];  Service: Open Heart Surgery;  Laterality: N/A;  . KNEE ARTHROSCOPY     bilaterally  . KNEE ARTHROSCOPY W/ PARTIAL MEDIAL  MENISCECTOMY  12/2002   left  . LEFT VENTRICULAR ASSIST DEVICE    . RIGHT HEART CATHETERIZATION N/A 08/25/2013   Procedure: RIGHT HEART CATH;  Surgeon: Jolaine Artist, MD;  Location: [facility] CATH LAB;  Service: Cardiovascular;  Laterality: N/A;  . RIGHT HEART CATHETERIZATION N/A 09/13/2013   Procedure: RIGHT HEART CATH;  Surgeon: Jolaine Artist, MD;  Location: [facility] CATH LAB;  Service: Cardiovascular;  Laterality: N/A;  . RIGHT HEART CATHETERIZATION N/A 02/08/2014   Procedure: RIGHT HEART CATH;  Surgeon: Jolaine Artist, MD;  Location: [facility] CATH LAB;  Service: Cardiovascular;  Laterality: N/A;  . RIGHT HEART CATHETERIZATION N/A 02/12/2014   Procedure: RIGHT HEART CATH;  Surgeon: Jolaine Artist, MD;  Location: [facility] CATH LAB;  Service: Cardiovascular;  Laterality: N/A;  . TOTAL KNEE ARTHROPLASTY  11/27/11   left  . TOTAL KNEE ARTHROPLASTY  11/27/2011   Procedure: TOTAL KNEE ARTHROPLASTY;  Surgeon: Yvette Rack., MD;  Location: [place];  Service: Orthopedics;  Laterality: Left;  left total knee arthroplasty  . VIDEO ASSISTED THORACOSCOPY Right 11/06/2013   Procedure: VIDEO ASSISTED THORACOSCOPY;  Surgeon: Gaye Pollack, MD;  Location: [facility] OR;  Service: Cardiothoracic;  Laterality: Right;     Medications Prior to Admission  Medication Sig Dispense Refill Last Dose  . acetaminophen (TYLENOL) 500 MG chewable tablet Chew 500 mg by mouth every 6 (six) hours as needed for pain.     [name] aspirin EC 325 MG tablet Take 325 mg by mouth daily.     . citalopram (CELEXA) 10 MG tablet Take 1 tablet by mouth once daily 90 tablet 0   . Coenzyme Q10 (COQ10) 200 MG CAPS Take 200 mg by mouth daily.     . famotidine (PEPCID) 20 MG tablet Take 20 mg by mouth 2 (two) times daily.     . furosemide (LASIX) 20 MG tablet Take 20 mg by mouth daily as needed (Take 20 mg as daily as needed for wt > 153 lbs). Reported on 01/14/2016     . hydrALAZINE (APRESOLINE) 50 MG tablet TAKE 1 TABLET BY MOUTH THREE TIMES DAILY 270 tablet 3    . levothyroxine (SYNTHROID) 137 MCG tablet Take 1 tab by mouth in the morning (except half tablet on Sundays) on an empty stomach. 90 tablet 1   . Melatonin 3 MG TABS Take 1 tablet by mouth at bedtime.     . pantoprazole (PROTONIX) 40 MG tablet Take 1 tablet by mouth once daily 90 tablet 11   . potassium chloride SA (K-DUR,KLOR-CON) 20 MEQ tablet Take 1 tablet (20 mEq total) by mouth daily as needed. Take one potassium pill when you take your lasix. (Patient not taking: Reported on 05/24/2019) 90 tablet 3   . Probiotic Product (PROBIOTIC PO) Take 1 capsule by mouth daily.     . rosuvastatin (CRESTOR) 20 MG tablet Take 1 tablet by mouth once daily 90 tablet 1   . spironolactone (ALDACTONE) 25 MG tablet Take 1 tablet (25 mg  total) by mouth daily. Take in the evening 30 tablet 3   . traZODone (DESYREL) 50 MG tablet Take 1 tablet (50 mg total) by mouth at bedtime. (Patient taking differently: Take 50 mg by mouth at bedtime as needed. ) 30 tablet 5   . warfarin (COUMADIN) 5 MG tablet TAKE 2 TABLETS BY MOUTH ONCE DAILY. TAKE 1 TABLET ON MONDAY AND WEDNESDAY (Patient taking differently: Take 5 mg (1 tab) on Monday and Friday and 10 mg all other days) 60 tablet 11     Inpatient Medications:  . citalopram  10 mg Oral Daily  . hydrALAZINE  50 mg Oral TID  . levothyroxine  137 mcg Oral Q0600  . Melatonin  6 mg Oral QHS  . mupirocin ointment      . pantoprazole  40 mg Oral Daily  . phenylephrine      . potassium chloride  40 mEq Oral Once  . rosuvastatin  20 mg Oral Daily    Allergies:  Allergies  Allergen Reactions  . Ace Inhibitors     Hypotension and elevated creatinine  . Diovan [Valsartan] Other (See Comments)    Hypotension at low dose, hyperkalemia  . Lipitor [Atorvastatin Calcium] Other (See Comments)    Muscle pain  . Norvasc [Amlodipine] Other (See Comments)    Put patient to sleep    Social History   Socioeconomic History  . Marital status: Married    Spouse name: Not on file   . Number of children: 1  . Years of education: Not on file  . Highest education level: Not on file  Occupational History  . Occupation: Music therapist: RETIRED  . Occupation: [employer]  . Financial resource strain: Not on file  . Food insecurity    Worry: Not on file    Inability: Not on file  . Transportation needs    Medical: Not on file    Non-medical: Not on file  Tobacco Use  . Smoking status: Never Smoker  . Smokeless tobacco: Never Used  Substance and Sexual Activity  . Alcohol use: No  . Drug use: No    Types: Marijuana    Comment: "back in our younger days we smoked a little pot"  . Sexual activity: Not Currently  Lifestyle  . Physical activity    Days per week: Not on file    Minutes per session: Not on file  . Stress: Not on file  Relationships  . Social Herbalist on phone: Not on file    Gets together: Not on file    Attends religious service: Not on file    Active member of club or organization: Not on file    Attends meetings of clubs or organizations: Not on file    Relationship status: Not on file  . Intimate partner violence    Fear of current or ex partner: Not on file    Emotionally abused: Not on file    Physically abused: Not on file    Forced sexual activity: Not on file  Other Topics Concern  . Not on file  Social History Narrative  . Not on file     Family History  Problem Relation Age of Onset  . Heart failure Father   . Stroke Father   . Coronary artery disease Mother   . Heart disease Mother   . Hyperlipidemia Sister      Review of Systems: All other systems reviewed and  are otherwise negative except as noted above.  Physical Exam: Vitals:   06/27/19 2147 06/27/19 2154 06/27/19 2325 06/28/19 0432  BP:  (!) 127/94 (!) 120/101 (!) 108/96  Pulse: (!) 11 94 90 65  Resp:  16 18 12   Temp: 98 F (36.7 C) 98 F (36.7 C) 98 F (36.7 C) 97.9 F (36.6 C)  TempSrc: Oral Oral Oral Oral  SpO2:  100% 93% 100% 97%  Weight:  74.6 kg  73.3 kg  Height:  5\' 7"  (1.702 m)      GEN- The patient is well appearing, alert and oriented x 3 today.   HEENT: normocephalic, sclera clear, conjunctiva pink; hearing intact; oropharynx clear; neck supple. Dressing to back of head C/D/I.  Lungs- Clear to ausculation bilaterally, normal work of breathing.  No wheezes, rales, rhonchi Heart- LVAD hum.  GI- soft, non-tender, non-distended, bowel sounds present. Driveline stable.  Extremities- no clubbing, cyanosis, or edema; DP/PT/radial pulses 2+ bilaterally MS- no significant deformity or atrophy Skin- warm and dry, no rash or lesion Psych- euthymic mood, full affect Neuro- strength and sensation are intact  Labs:   Lab Results  Component Value Date   WBC 6.2 06/28/2019   HGB 12.9 (L) 06/28/2019   HCT 39.0 06/28/2019   MCV 91.1 06/28/2019   PLT 150 06/28/2019    Recent Labs  Lab 06/27/19 2004 06/28/19 0339  NA 132* 138  K 3.9 3.9  CL 100 108  CO2 18* 22  BUN 25* 20  CREATININE 1.90* 1.50*  CALCIUM 9.2 8.7*  PROT 7.2  --   BILITOT 1.0  --   ALKPHOS 66  --   ALT 22  --   AST 33  --   GLUCOSE 143* 127*      Radiology/Studies: Ct Head Wo Contrast  Result Date: 06/27/2019 CLINICAL DATA:  Recent syncopal episode with head injury and neck pain, initial encounter EXAM: CT HEAD WITHOUT CONTRAST CT CERVICAL SPINE WITHOUT CONTRAST TECHNIQUE: Multidetector CT imaging of the head and cervical spine was performed following the standard protocol without intravenous contrast. Multiplanar CT image reconstructions of the cervical spine were also generated. COMPARISON:  09/03/2015 FINDINGS: CT HEAD FINDINGS Brain: No evidence of acute infarction, hemorrhage, hydrocephalus, extra-axial collection or mass lesion/mass effect. Vascular: No hyperdense vessel or unexpected calcification. Skull: Normal. Negative for fracture or focal lesion. Sinuses/Orbits: No acute finding. Other: None. CT CERVICAL SPINE  FINDINGS Alignment: Mild straightening of the normal cervical lordosis is noted. Skull base and vertebrae: 7 cervical segments are well visualized. Vertebral body height is well maintained. Multilevel disc space narrowing and osteophytic changes are seen. Diffuse facet hypertrophic changes are noted as well. No acute fracture or acute facet abnormality is noted. Soft tissues and spinal canal: Surrounding soft tissue structures are within normal limits. No focal hematoma is seen. Upper chest: Visualized lung apices are unremarkable. Other: None IMPRESSION: CT of the head: No acute intracranial abnormality noted. CT of the cervical spine: Mild straightening of the normal cervical lordosis likely related to muscular spasm. Multilevel facet hypertrophic changes and osteophytic changes without acute abnormality. Electronically Signed   By: Inez Catalina M.D.   On: 06/27/2019 21:38   Ct Cervical Spine Wo Contrast  Result Date: 06/27/2019 CLINICAL DATA:  Recent syncopal episode with head injury and neck pain, initial encounter EXAM: CT HEAD WITHOUT CONTRAST CT CERVICAL SPINE WITHOUT CONTRAST TECHNIQUE: Multidetector CT imaging of the head and cervical spine was performed following the standard protocol without intravenous contrast. Multiplanar CT  image reconstructions of the cervical spine were also generated. COMPARISON:  09/03/2015 FINDINGS: CT HEAD FINDINGS Brain: No evidence of acute infarction, hemorrhage, hydrocephalus, extra-axial collection or mass lesion/mass effect. Vascular: No hyperdense vessel or unexpected calcification. Skull: Normal. Negative for fracture or focal lesion. Sinuses/Orbits: No acute finding. Other: None. CT CERVICAL SPINE FINDINGS Alignment: Mild straightening of the normal cervical lordosis is noted. Skull base and vertebrae: 7 cervical segments are well visualized. Vertebral body height is well maintained. Multilevel disc space narrowing and osteophytic changes are seen. Diffuse facet  hypertrophic changes are noted as well. No acute fracture or acute facet abnormality is noted. Soft tissues and spinal canal: Surrounding soft tissue structures are within normal limits. No focal hematoma is seen. Upper chest: Visualized lung apices are unremarkable. Other: None IMPRESSION: CT of the head: No acute intracranial abnormality noted. CT of the cervical spine: Mild straightening of the normal cervical lordosis likely related to muscular spasm. Multilevel facet hypertrophic changes and osteophytic changes without acute abnormality. Electronically Signed   By: Inez Catalina M.D.   On: 06/27/2019 21:38    EKG: 06/27/19 shows NSR at 97 bpm (personally reviewed)  TELEMETRY: NSR 80-90s, personally reviewed (personally reviewed)  DEVICE HISTORY: MDT dual chamber ICD placed 2009; allowed to EOS in setting of LVAD placed in 2015.  Assessment/Plan: 1.  Chronic systolic CHF / VT / Syncope - Pt has a MDT dual chamber ICD that was allowed to EOS in the setting of LVAD. Now, with recurrent VT accompanied with syncope; Johnny Oneill plan for generator change.  Discussed risks and benefits and pt willing to proceed.  INR 3.0. Johnny Oneill discuss among EP and HF team if prudent to wait until tomorrow or proceed. Johnny Oneill keep NPO and place orders for now.  - K 3.9, Mg 1.9 overnight and supped in ED.   2. LVAD - LDH 192. INR 3.0, holding coumadin tonight.  - Parameters stable.   3. AKI  Cr 1.9 -> 1.5. Likely in setting of dehydration. Following.    For questions or updates, please contact Johnny Oneill Please consult [email or web address] for contact info under Cardiology/STEMI.  Signed, Johnny Friar, PA-C  06/28/2019 8:42 AM    I have seen and examined this patient with Johnny Oneill.  Agree with above, note added to reflect my findings.  On exam, RRR, no murmurs, lungs clear, LVAD hum.  Patient admitted after prolonged episode of VT requiring cardioversion in the emergency room.  He does have an LVAD.  He  has an ICD in place but the battery has run down.  Due to that, we Johnny Oneill plan for ICD generator change today.  Risks and benefits discussed include bleeding and infection.  The patient understands the risks and is agreed to the procedure.  Johnny Oneill M. Duan Scharnhorst MD 06/28/2019 11:39 AM

## 2019-06-28 NOTE — Plan of Care (Signed)
  Problem: Education: Goal: Knowledge of General Education information will improve Description: Including pain rating scale, medication(s)/side effects and non-pharmacologic comfort measures Outcome: Progressing   Problem: Health Behavior/Discharge Planning: Goal: Ability to manage health-related needs will improve Outcome: Progressing   Problem: Clinical Measurements: Goal: Ability to maintain clinical measurements within normal limits will improve Outcome: Progressing Goal: Will remain free from infection Outcome: Progressing Goal: Diagnostic test results will improve Outcome: Progressing Goal: Respiratory complications will improve Outcome: Progressing Goal: Cardiovascular complication will be avoided Outcome: Progressing   Problem: Activity: Goal: Risk for activity intolerance will decrease Outcome: Progressing   Problem: Nutrition: Goal: Adequate nutrition will be maintained Outcome: Progressing   Problem: Coping: Goal: Level of anxiety will decrease Outcome: Progressing   Problem: Coping: Goal: Level of anxiety will decrease Outcome: Progressing   Problem: Elimination: Goal: Will not experience complications related to bowel motility Outcome: Progressing Goal: Will not experience complications related to urinary retention Outcome: Progressing   Problem: Pain Managment: Goal: General experience of comfort will improve Outcome: Progressing   Problem: Safety: Goal: Ability to remain free from injury will improve Outcome: Progressing   Problem: Skin Integrity: Goal: Risk for impaired skin integrity will decrease Outcome: Progressing   Problem: Education: Goal: Patient will understand all VAD equipment and how it functions Outcome: Progressing Goal: Patient will be able to verbalize current INR target range and antiplatelet therapy for discharge home Outcome: Progressing   Problem: Cardiac: Goal: LVAD will function as expected and patient will  experience no clinical alarms Outcome: Progressing

## 2019-06-28 NOTE — Progress Notes (Signed)
Advanced Heart Failure Rounding Note   Subjective:    No further VT overnight. Feels ok. Neuro status normal.   Denies SOB, orthopnea or PND    Objective:   Weight Range:  Vital Signs:   Temp:  [97.9 F (36.6 C)-98.3 F (36.8 C)] 97.9 F (36.6 C) (10/28 0432) Pulse Rate:  [11-243] 65 (10/28 0432) Resp:  [0-27] 12 (10/28 0432) BP: (108-127)/(94-102) 108/96 (10/28 0432) SpO2:  [93 %-100 %] 97 % (10/28 0432) Weight:  [73.3 kg-74.6 kg] 73.3 kg (10/28 0432) Last BM Date: 06/27/19  Weight change: Filed Weights   06/27/19 2154 06/28/19 0432  Weight: 74.6 kg 73.3 kg    Intake/Output:   Intake/Output Summary (Last 24 hours) at 06/28/2019 0828 Last data filed at 06/28/2019 0600 Gross per 24 hour  Intake 252.18 ml  Output 1575 ml  Net -1322.82 ml     Physical Exam: General:  NAD.  HEENT: normal wound on back of head Neck: supple. JVP not elevated.  Carotids 2+ bilat; no bruits. No lymphadenopathy or thryomegaly appreciated. Cor: LVAD hum.  Lungs: Clear. Abdomen: soft, nontender, non-distended. No hepatosplenomegaly. No bruits or masses. Good bowel sounds. Driveline site clean. Anchor in place.  Extremities: no cyanosis, clubbing, rash. Warm no edema  Neuro: alert & oriented x 3. No focal deficits. Moves all 4 without problem    Telemetry: NSR 80-90 Personally reviewed   Labs: Basic Metabolic Panel: Recent Labs  Lab 06/27/19 2004 06/27/19 2033 06/28/19 0339  NA 132*  --  138  K 3.9  --  3.9  CL 100  --  108  CO2 18*  --  22  GLUCOSE 143*  --  127*  BUN 25*  --  20  CREATININE 1.90*  --  1.50*  CALCIUM 9.2  --  8.7*  MG  --  1.9  --     Liver Function Tests: Recent Labs  Lab 06/27/19 2004  AST 33  ALT 22  ALKPHOS 66  BILITOT 1.0  PROT 7.2  ALBUMIN 4.4   No results for input(s): LIPASE, AMYLASE in the last 168 hours. No results for input(s): AMMONIA in the last 168 hours.  CBC: Recent Labs  Lab 06/27/19 2004 06/28/19 0339  WBC 13.1*  6.2  NEUTROABS 11.1*  --   HGB 14.5 12.9*  HCT 46.2 39.0  MCV 94.9 91.1  PLT 192 150    Cardiac Enzymes: No results for input(s): CKTOTAL, CKMB, CKMBINDEX, TROPONINI in the last 168 hours.  BNP: BNP (last 3 results) No results for input(s): BNP in the last 8760 hours.  ProBNP (last 3 results) No results for input(s): PROBNP in the last 8760 hours.    Other results:  Imaging: Ct Head Wo Contrast  Result Date: 06/27/2019 CLINICAL DATA:  Recent syncopal episode with head injury and neck pain, initial encounter EXAM: CT HEAD WITHOUT CONTRAST CT CERVICAL SPINE WITHOUT CONTRAST TECHNIQUE: Multidetector CT imaging of the head and cervical spine was performed following the standard protocol without intravenous contrast. Multiplanar CT image reconstructions of the cervical spine were also generated. COMPARISON:  09/03/2015 FINDINGS: CT HEAD FINDINGS Brain: No evidence of acute infarction, hemorrhage, hydrocephalus, extra-axial collection or mass lesion/mass effect. Vascular: No hyperdense vessel or unexpected calcification. Skull: Normal. Negative for fracture or focal lesion. Sinuses/Orbits: No acute finding. Other: None. CT CERVICAL SPINE FINDINGS Alignment: Mild straightening of the normal cervical lordosis is noted. Skull base and vertebrae: 7 cervical segments are well visualized. Vertebral body height is well  maintained. Multilevel disc space narrowing and osteophytic changes are seen. Diffuse facet hypertrophic changes are noted as well. No acute fracture or acute facet abnormality is noted. Soft tissues and spinal canal: Surrounding soft tissue structures are within normal limits. No focal hematoma is seen. Upper chest: Visualized lung apices are unremarkable. Other: None IMPRESSION: CT of the head: No acute intracranial abnormality noted. CT of the cervical spine: Mild straightening of the normal cervical lordosis likely related to muscular spasm. Multilevel facet hypertrophic changes and  osteophytic changes without acute abnormality. Electronically Signed   By: Inez Catalina M.D.   On: 06/27/2019 21:38   Ct Cervical Spine Wo Contrast  Result Date: 06/27/2019 CLINICAL DATA:  Recent syncopal episode with head injury and neck pain, initial encounter EXAM: CT HEAD WITHOUT CONTRAST CT CERVICAL SPINE WITHOUT CONTRAST TECHNIQUE: Multidetector CT imaging of the head and cervical spine was performed following the standard protocol without intravenous contrast. Multiplanar CT image reconstructions of the cervical spine were also generated. COMPARISON:  09/03/2015 FINDINGS: CT HEAD FINDINGS Brain: No evidence of acute infarction, hemorrhage, hydrocephalus, extra-axial collection or mass lesion/mass effect. Vascular: No hyperdense vessel or unexpected calcification. Skull: Normal. Negative for fracture or focal lesion. Sinuses/Orbits: No acute finding. Other: None. CT CERVICAL SPINE FINDINGS Alignment: Mild straightening of the normal cervical lordosis is noted. Skull base and vertebrae: 7 cervical segments are well visualized. Vertebral body height is well maintained. Multilevel disc space narrowing and osteophytic changes are seen. Diffuse facet hypertrophic changes are noted as well. No acute fracture or acute facet abnormality is noted. Soft tissues and spinal canal: Surrounding soft tissue structures are within normal limits. No focal hematoma is seen. Upper chest: Visualized lung apices are unremarkable. Other: None IMPRESSION: CT of the head: No acute intracranial abnormality noted. CT of the cervical spine: Mild straightening of the normal cervical lordosis likely related to muscular spasm. Multilevel facet hypertrophic changes and osteophytic changes without acute abnormality. Electronically Signed   By: Inez Catalina M.D.   On: 06/27/2019 21:38      Medications:     Scheduled Medications:  citalopram  10 mg Oral Daily   hydrALAZINE  50 mg Oral TID   levothyroxine  137 mcg Oral Q0600     Melatonin  6 mg Oral QHS   mupirocin ointment       pantoprazole  40 mg Oral Daily   phenylephrine       rosuvastatin  20 mg Oral Daily     Infusions:  amiodarone 30 mg/hr (06/28/19 0450)     PRN Medications:  acetaminophen, ondansetron (ZOFRAN) IV   Assessment/Plan:   1. Ventricular tachycardia -> syncope - on presentation in VT at 240 -? Suction even in setting of low volume - emergent DC-CV in ER on 10/27 - K 3.9, Mg 1.9. Supped in ER  K eep K> 4.0 Mg > 2.0 - Continue amiodarone - D/w EP for possible ICD gen replacement today (EOL)  2. Head trauma - no bleed on CT. Be careful with INR. No coumadin today. Discussed dosing with PharmD personally.   3. Chronic systolic HF s/p VAD - Volume status ok today - INR 3.0. No coumadin tonight. - LDH 192 - VAD interrogated personally. Parameters stable.   4. AKI - due to volume depletion.  resolved with fluid resuscitation  5. HTN - restart hydralazine   Length of Stay: 1   Glori Bickers MD 06/28/2019, 8:28 AM  Advanced Heart Failure Team Pager [phone] (M-F; 7a - 4p)  Please contact [facility] for night-coverage after hours (4p -7a ) and weekends on [email or web address]

## 2019-06-29 ENCOUNTER — Encounter (HOSPITAL_COMMUNITY): Payer: Self-pay | Admitting: Unknown Physician Specialty

## 2019-06-29 ENCOUNTER — Inpatient Hospital Stay (HOSPITAL_COMMUNITY): Payer: Medicare Other

## 2019-06-29 ENCOUNTER — Encounter (HOSPITAL_COMMUNITY): Payer: Self-pay | Admitting: Cardiology

## 2019-06-29 DIAGNOSIS — Z952 Presence of prosthetic heart valve: Secondary | ICD-10-CM

## 2019-06-29 LAB — BASIC METABOLIC PANEL
Anion gap: 7 (ref 5–15)
BUN: 17 mg/dL (ref 8–23)
CO2: 24 mmol/L (ref 22–32)
Calcium: 8.5 mg/dL — ABNORMAL LOW (ref 8.9–10.3)
Chloride: 107 mmol/L (ref 98–111)
Creatinine, Ser: 1.88 mg/dL — ABNORMAL HIGH (ref 0.61–1.24)
GFR calc Af Amer: 40 mL/min — ABNORMAL LOW (ref >60)
GFR calc non Af Amer: 34 mL/min — ABNORMAL LOW (ref >60)
Glucose, Bld: 112 mg/dL — ABNORMAL HIGH (ref 70–99)
Potassium: 4.6 mmol/L (ref 3.5–5.1)
Sodium: 138 mmol/L (ref 135–145)

## 2019-06-29 LAB — CBC
HCT: 38.5 % — ABNORMAL LOW (ref 39.0–52.0)
Hemoglobin: 12.8 g/dL — ABNORMAL LOW (ref 13.0–17.0)
MCH: 30.4 pg (ref 26.0–34.0)
MCHC: 33.2 g/dL (ref 30.0–36.0)
MCV: 91.4 fL (ref 80.0–100.0)
Platelets: 136 10*3/uL — ABNORMAL LOW (ref 150–400)
RBC: 4.21 MIL/uL — ABNORMAL LOW (ref 4.22–5.81)
RDW: 15 % (ref 11.5–15.5)
WBC: 6.8 10*3/uL (ref 4.0–10.5)
nRBC: 0 % (ref 0.0–0.2)

## 2019-06-29 LAB — PROTIME-INR
INR: 2.4 — ABNORMAL HIGH (ref 0.8–1.2)
Prothrombin Time: 25.8 seconds — ABNORMAL HIGH (ref 11.4–15.2)

## 2019-06-29 LAB — LACTATE DEHYDROGENASE: LDH: 194 U/L — ABNORMAL HIGH (ref 98–192)

## 2019-06-29 MED ORDER — AMIODARONE HCL 200 MG PO TABS: 200.0000 mg | ORAL_TABLET | Freq: Two times a day (BID) | ORAL | 6 refills | Status: DC

## 2019-06-29 MED ORDER — AMIODARONE HCL 200 MG PO TABS
200.0000 mg | ORAL_TABLET | Freq: Two times a day (BID) | ORAL | Status: DC
  Administered 2019-06-29: 200 mg via ORAL
  Filled 2019-06-29: qty 1

## 2019-06-29 MED FILL — Gentamicin Sulfate Inj 40 MG/ML: INTRAMUSCULAR | Qty: 80 | Status: AC

## 2019-06-29 MED FILL — Lidocaine HCl Local Preservative Free (PF) Inj 1%: INTRAMUSCULAR | Qty: 30 | Status: AC

## 2019-06-29 MED FILL — AMIODARONE HCL 200 MG TABS: 200 | 30 days supply | Qty: 60 | Fill #0

## 2019-06-29 NOTE — Progress Notes (Signed)
ANTICOAGULATION CONSULT NOTE - Initial Consult  Pharmacy Consult for warfarin Indication: LVAD  Allergies  Allergen Reactions  . Ace Inhibitors     Hypotension and elevated creatinine  . Diovan [Valsartan] Other (See Comments)    Hypotension at low dose, hyperkalemia  . Lipitor [Atorvastatin Calcium] Other (See Comments)    Muscle pain  . Norvasc [Amlodipine] Other (See Comments)    Put patient to sleep    Patient Measurements: Height: 5\' 7"  (170.2 cm) Weight: 163 lb 14.4 oz (74.3 kg) IBW/kg (Calculated) : 66.1   Vital Signs: Temp: 97.7 F (36.5 C) (10/29 0755) Temp Source: Oral (10/29 0755) BP: 94/83 (10/29 0755) Pulse Rate: 67 (10/29 0755)  Labs: Recent Labs    06/27/19 2004 06/28/19 0339 06/29/19 0233  HGB 14.5 12.9* 12.8*  HCT 46.2 39.0 38.5*  PLT 192 150 136*  LABPROT 26.8* 30.8* 25.8*  INR 2.5* 3.0* 2.4*  CREATININE 1.90* 1.50* 1.88*    Estimated Creatinine Clearance: 31.7 mL/min (A) (by C-G formula based on SCr of 1.88 mg/dL (H)).   Medical History: Past Medical History:  Diagnosis Date  . Anxiety   . Automatic implantable cardioverter-defibrillator in situ   . CAD (coronary artery disease)    S/P stenting of LCX in 2004;  s/p Lat MI 2009 with occlusion of the LCX - treated with Promus stenting. Has total occlusion of the RCA.   [name] CHF (congestive heart failure) ([place])   . Hypercholesteremia   . Hypertension   . Hypothyroidism   . ICD (implantable cardiac defibrillator) in place    PROPHYLACTIC      medtronic  . Inferior MI ([place]) "? date"; 2009  . Ischemic cardiomyopathy    a. 10/09 Echo: Sev LV Dysfxn, inf/lat AK, Mod MR.  [name] LVAD (left ventricular assist device) present ([place])   . Osteoarthritis    a. s/p R TKR  . Pacemaker   . PVC (premature ventricular contraction)   . RBBB   . Shortness of breath     Medications:  Scheduled:  . amiodarone  200 mg Oral BID  . citalopram  10 mg Oral Daily  . hydrALAZINE  50 mg Oral TID  . levothyroxine   137 mcg Oral Q0600  . Melatonin  6 mg Oral QHS  . pantoprazole  40 mg Oral Daily  . rosuvastatin  20 mg Oral Daily   Infusions:  . amiodarone 30 mg/hr (06/28/19 1130)  .  ceFAZolin (ANCEF) IV 1 g (06/29/19 0804)    Assessment: 78 yo M on warfarin PTA for LVAD. PTA dose is 10 mg daily except 5 mg on Mon and Fri  Admitted following VT with syncope. INR on admission was 2.5 (goal 2.5-3). INR today is 2.4 after holding 2 doses for EP procedures. Pharmacy consulted to resume warfarin tonight.  Goal of Therapy:  INR goal 2.5-3 Monitor platelets by anticoagulation protocol: Yes   Plan:  Discharging today Resume PTA dose tonight - 10 mg daily except 5 mg on Mon and Fri Next INR in HF clinic follow up  Vertis Kelch, PharmD PGY2 Cardiology Pharmacy Resident Phone [phone] 06/29/2019       9:59 AM  Please check [email or web address] for unit-specific pharmacist phone numbers

## 2019-06-29 NOTE — Progress Notes (Addendum)
Electrophysiology Rounding Note  Patient Name: Johnny Oneill Date of Encounter: 06/29/2019  Primary Cardiologist: Dr. Haroldine Laws Electrophysiologist: Dr. Rayann Heman    Subjective   S/p gen change of MDT dual chamber ICD 06/28/2019  CXR stable. Small hematoma at area. May Ozment replace pressure dressing after MD assesses. No further VT  The patient is doing well today.  At this time, the patient denies chest pain, shortness of breath, or any new concerns.  Inpatient Medications    Scheduled Meds: . citalopram  10 mg Oral Daily  . hydrALAZINE  50 mg Oral TID  . levothyroxine  137 mcg Oral Q0600  . Melatonin  6 mg Oral QHS  . pantoprazole  40 mg Oral Daily  . rosuvastatin  20 mg Oral Daily   Continuous Infusions: . amiodarone 30 mg/hr (06/28/19 1130)  .  ceFAZolin (ANCEF) IV 1 g (06/28/19 2339)   PRN Meds: acetaminophen, ondansetron (ZOFRAN) IV   Vital Signs    Vitals:   06/28/19 2328 06/28/19 2329 06/29/19 0422 06/29/19 0615  BP:  97/85 95/78   Pulse:  63 60   Resp:   14   Temp: 98.1 F (36.7 C)  98.1 F (36.7 C)   TempSrc: Oral  Oral   SpO2: 97%  98%   Weight:    74.3 kg  Height:        Intake/Output Summary (Last 24 hours) at 06/29/2019 0654 Last data filed at 06/29/2019 0422 Gross per 24 hour  Intake 1137.46 ml  Output 1950 ml  Net -812.54 ml   Filed Weights   06/27/19 2154 06/28/19 0432 06/29/19 0615  Weight: 74.6 kg 73.3 kg 74.3 kg    Physical Exam    GEN- The patient is well appearing, alert and oriented x 3 today.   Head- normocephalic. Dressing on back of head C/D/I Eyes-  Sclera clear, conjunctiva pink Ears- hearing intact Oropharynx- clear Neck- supple Lungs- Clear to ausculation bilaterally, normal work of breathing Heart- LVAD hum.  ICD site dressing with old blood. Small, soft, non-tender hematoma.  GI- soft, NT, ND, + BS Extremities- no clubbing, cyanosis, or edema Skin- no rash or lesion Psych- euthymic mood, full affect Neuro-  strength and sensation are intact  Labs    CBC Recent Labs    06/27/19 2004 06/28/19 0339 06/29/19 0233  WBC 13.1* 6.2 6.8  NEUTROABS 11.1*  --   --   HGB 14.5 12.9* 12.8*  HCT 46.2 39.0 38.5*  MCV 94.9 91.1 91.4  PLT 192 150 [id number]*   Basic Metabolic Panel Recent Labs    06/27/19 2033 06/28/19 0339 06/29/19 0233  NA  --  138 138  K  --  3.9 4.6  CL  --  108 107  CO2  --  22 24  GLUCOSE  --  127* 112*  BUN  --  20 17  CREATININE  --  1.50* 1.88*  CALCIUM  --  8.7* 8.5*  MG 1.9  --   --    Liver Function Tests Recent Labs    06/27/19 2004  AST 33  ALT 22  ALKPHOS 66  BILITOT 1.0  PROT 7.2  ALBUMIN 4.4   No results for input(s): LIPASE, AMYLASE in the last 72 hours. Cardiac Enzymes No results for input(s): CKTOTAL, CKMB, CKMBINDEX, TROPONINI in the last 72 hours. BNP Invalid input(s): POCBNP D-Dimer No results for input(s): DDIMER in the last 72 hours. Hemoglobin A1C No results for input(s): HGBA1C in the last 72 hours. Fasting  Lipid Panel No results for input(s): CHOL, HDL, LDLCALC, TRIG, CHOLHDL, LDLDIRECT in the last 72 hours. Thyroid Function Tests No results for input(s): TSH, T4TOTAL, T3FREE, THYROIDAB in the last 72 hours.  Invalid input(s): FREET3  Telemetry    NSR 60s (personally reviewed)  Radiology    Ct Head Wo Contrast  Result Date: 06/27/2019 CLINICAL DATA:  Recent syncopal episode with head injury and neck pain, initial encounter EXAM: CT HEAD WITHOUT CONTRAST CT CERVICAL SPINE WITHOUT CONTRAST TECHNIQUE: Multidetector CT imaging of the head and cervical spine was performed following the standard protocol without intravenous contrast. Multiplanar CT image reconstructions of the cervical spine were also generated. COMPARISON:  09/03/2015 FINDINGS: CT HEAD FINDINGS Brain: No evidence of acute infarction, hemorrhage, hydrocephalus, extra-axial collection or mass lesion/mass effect. Vascular: No hyperdense vessel or unexpected calcification.  Skull: Normal. Negative for fracture or focal lesion. Sinuses/Orbits: No acute finding. Other: None. CT CERVICAL SPINE FINDINGS Alignment: Mild straightening of the normal cervical lordosis is noted. Skull base and vertebrae: 7 cervical segments are well visualized. Vertebral body height is well maintained. Multilevel disc space narrowing and osteophytic changes are seen. Diffuse facet hypertrophic changes are noted as well. No acute fracture or acute facet abnormality is noted. Soft tissues and spinal canal: Surrounding soft tissue structures are within normal limits. No focal hematoma is seen. Upper chest: Visualized lung apices are unremarkable. Other: None IMPRESSION: CT of the head: No acute intracranial abnormality noted. CT of the cervical spine: Mild straightening of the normal cervical lordosis likely related to muscular spasm. Multilevel facet hypertrophic changes and osteophytic changes without acute abnormality. Electronically Signed   By: Inez Catalina M.D.   On: 06/27/2019 21:38   Ct Cervical Spine Wo Contrast  Result Date: 06/27/2019 CLINICAL DATA:  Recent syncopal episode with head injury and neck pain, initial encounter EXAM: CT HEAD WITHOUT CONTRAST CT CERVICAL SPINE WITHOUT CONTRAST TECHNIQUE: Multidetector CT imaging of the head and cervical spine was performed following the standard protocol without intravenous contrast. Multiplanar CT image reconstructions of the cervical spine were also generated. COMPARISON:  09/03/2015 FINDINGS: CT HEAD FINDINGS Brain: No evidence of acute infarction, hemorrhage, hydrocephalus, extra-axial collection or mass lesion/mass effect. Vascular: No hyperdense vessel or unexpected calcification. Skull: Normal. Negative for fracture or focal lesion. Sinuses/Orbits: No acute finding. Other: None. CT CERVICAL SPINE FINDINGS Alignment: Mild straightening of the normal cervical lordosis is noted. Skull base and vertebrae: 7 cervical segments are well visualized.  Vertebral body height is well maintained. Multilevel disc space narrowing and osteophytic changes are seen. Diffuse facet hypertrophic changes are noted as well. No acute fracture or acute facet abnormality is noted. Soft tissues and spinal canal: Surrounding soft tissue structures are within normal limits. No focal hematoma is seen. Upper chest: Visualized lung apices are unremarkable. Other: None IMPRESSION: CT of the head: No acute intracranial abnormality noted. CT of the cervical spine: Mild straightening of the normal cervical lordosis likely related to muscular spasm. Multilevel facet hypertrophic changes and osteophytic changes without acute abnormality. Electronically Signed   By: Inez Catalina M.D.   On: 06/27/2019 21:38     Patient Profile     JAKARRI HOLZ is a 78 y.o. 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 Haroldine Laws.  Underwent Gen change for new VT with syncope 06/28/2019  Assessment & Plan  1. Chronic systolic CHF/ VT /syncope - s/p HMII LVAD 2015 - s/p MDT dual chamber ICD gen change 06/28/2019 - CXR with stable VAD and ICD positioning.  - ICD site with old blood and small, soft, non-tender hematoma - INR 2.4 this am.  LDI 194. - K 4.6 - Follow up and instructions left in chart. Ted Leonhart replace pressure dressing.  If patient had follow up in VAD clinic early next week, I can come down and assess his hematoma, which should improve with pressure dressing. Otherwise, can be seen earlier than planned in Device clinic  2. AKI - Cr 1.90 -> 1.50 -> 1.88. - Spiro and lasix on hold.    For questions or updates, please contact [facility] Please consult [email or web address] for contact info under Cardiology/STEMI.  Signed, Shirley Friar, PA-C  06/29/2019, 6:54 AM   I have seen and examined this patient with [name].  Agree with above, note  added to reflect my findings.  On exam, RRR, no murmurs, lungs clear, LVAD hum.  Status post Medtronic ICD generator change.  Device functioning appropriately this morning.  He does have a hematoma.  Had a pressure dressing overnight last night.  Have replaced the pressure dressing.  We Gershon Shorten recheck at his follow-up appointment in heart failure clinic.  Trini Christiansen M. Izella Ybanez MD 06/29/2019 10:37 AM

## 2019-06-29 NOTE — Progress Notes (Signed)
Orthopedic Tech Progress Note Patient Details:  Johnny Oneill [birth year] [id number] RN said patient has arm sling Patient ID: Johnny Oneill, male   DOB: Nov 12, 1940, 78 y.o.   MRN: [id number]   Johnny Oneill 06/29/2019, 7:54 AM

## 2019-06-29 NOTE — Progress Notes (Signed)
LVAD Coordinator Rounding Note:  Admitted 06/27/19 due to VT.   HMII LVAD implanted on 09/19/13.  Pt sitting on the edge of the bed ready to go home. Has a pressure dressing on this pacer site.  Vital signs: HR: 60 Doppler Pressure: 106 Automatic BP: 108/96 (101) O2 Sat: 97 Wt: 161lbs   LVAD interrogation reveals:   Speed: 9200 Flow: 3.4 Power:  4.0 PI: 7.4 Alarms: none Events:  none  Fixed speed: 9200 Low speed limit: 8600   Drive Line: Existing VAD dressing removed and site care performed using sterile technique. Drive line exit site cleaned with saline x 2, allowed to dry, and Sorbaview dressing with bio patch re-applied. Exit site healed and incorporated, the velour is fully implanted at exit site. No redness, tenderness, drainage, foul odor or rash noted. Drive line anchor secure. Next dressing change due 07/05/19.  Labs:  LDH trend: 192>194  INR trend: 3.0>2.4  Anticoagulation Plan: -INR Goal: 2.5-3.0 -ASA Dose: holding  Device: -Medtronic -Therapies: off-device at ERI  Arrythmias: VT on admission; Started on amiodarone   Adverse Events on VAD: -06/27/19-VT pt sustained fall-CT head negative  Plan/Recommendations:  1. Please page VAD coordinator for any equipment issues or driveline questions. 2. Pt ok to d/c home. Follow up made for VAD clinic on 11/5 at [place] RN, BSN VAD Coordinator 24/7 Pager [phone]

## 2019-06-29 NOTE — Discharge Summary (Addendum)
Advanced Heart Failure Team  Discharge Summary   Patient ID: Johnny Oneill MRN: [id number], DOB/AGE: 09-09-1940 78 y.o. Admit date: 06/27/2019 D/C date:     06/29/2019   Primary Discharge Diagnoses:  1. VT/Syncope 2. Head Trauma  3. Chronic Systolic HF with LVAD HMII 4. AKI  5. 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   Has been doing very well. Was reffing volleyball game tonight in hot gym. Felt weak then had abrupt syncopal event. [place] and hit his head but got back up quickly. His wife brought him to the ER found to be in VT at 59 with low flows on VAD.   In ER, he had emergent DC-CV with the help of Dr. Johnney Killian and RRT. CT of head was negative. EP consulted for gen change. On 10/28 he underwent gen change out.  He will follow up with EP next next week. Continue amio 200 mg twice a day and begin taper in the outpatient setting.   See below for detailed problems list. He will contine to be followed closely in the VAD clinic.   1. Ventricular tachycardia -> syncope - on presentation in VT at 240 -? Suction even in setting of low volume - emergent DC-CV in ER on 06/27/19  - Started on amiodarone drip and later transitioned to amiodarone 200 mg tiwce a day.   - EP consulted and performed gen change on 06/28/19.   - Instructed he cannot drive for 6 months.   2. Head trauma - no bleed on CT. -  Discussed dosing with PharmD personally.   3. Chronic systolic HF s/p VAD - Volume status ok today - INR 2.4 . Plan to continue home coumadin regimen. . - LDH remains stable.  - VAD interrogated personally. Parameters stable.  4. AKI - Creatinine up with due to volume depletion.   - Plan to check BMET next week.   5. HTN Home regimen restarted prior to discharge.    LVAD Interrogation HM II:   Speed: 9200    Flow:    4.2  PI:      5.1  Power: 5   Back-up speed:     9200    Discharge Vitals: Blood pressure 94/83, pulse 67, temperature 97.7 F (36.5 C), temperature source Oral, resp. rate 19, height 5\' 7"  (1.702 m), weight 74.3 kg, SpO2 96 %.   Physical Exam: GENERAL: No acute distress. HEENT: Dressing on head C/D/I   NECK: Supple, JVP flat .  2+ bilaterally, no bruits.  No lymphadenopathy or thyromegaly appreciated.   CARDIAC:  Mechanical heart sounds with LVAD hum present.  Left upper chest dressing small hematoma noted.  LUNGS:  Clear to auscultation bilaterally.  ABDOMEN:  Soft, round, nontender, positive bowel sounds x4.     LVAD exit site: well-healed and incorporated.  Dressing dry and intact.  No erythema or drainage.  Stabilization device present and accurately applied.  Driveline dressing is being changed daily per sterile technique. EXTREMITIES:  Warm and dry, no cyanosis, clubbing, rash or edema  NEUROLOGIC:  Alert and oriented x 4.  No aphasia.  No dysarthria.  Affect pleasant.      Labs: Lab Results  Component Value Date   WBC 6.8 06/29/2019   HGB 12.8 (L) 06/29/2019   HCT 38.5 (L) 06/29/2019   MCV 91.4 06/29/2019  PLT 136 (L) 06/29/2019    Recent Labs  Lab 06/27/19 2004  06/29/19 0233  NA 132*   < > 138  K 3.9   < > 4.6  CL 100   < > 107  CO2 18*   < > 24  BUN 25*   < > 17  CREATININE 1.90*   < > 1.88*  CALCIUM 9.2   < > 8.5*  PROT 7.2  --   --   BILITOT 1.0  --   --   ALKPHOS 66  --   --   ALT 22  --   --   AST 33  --   --   GLUCOSE 143*   < > 112*   < > = values in this interval not displayed.   Lab Results  Component Value Date   CHOL 93 05/28/2015   HDL 41.50 05/28/2015   LDLCALC 29 05/28/2015   TRIG 112.0 05/28/2015   BNP (last 3 results) No results for input(s): BNP in the last 8760 hours.  ProBNP (last 3 results) No results for input(s): PROBNP in the last 8760 hours.   Diagnostic Studies/Procedures   Ct Head Wo Contrast  Result Date: 06/27/2019 CLINICAL  DATA:  Recent syncopal episode with head injury and neck pain, initial encounter EXAM: CT HEAD WITHOUT CONTRAST CT CERVICAL SPINE WITHOUT CONTRAST TECHNIQUE: Multidetector CT imaging of the head and cervical spine was performed following the standard protocol without intravenous contrast. Multiplanar CT image reconstructions of the cervical spine were also generated. COMPARISON:  09/03/2015 FINDINGS: CT HEAD FINDINGS Brain: No evidence of acute infarction, hemorrhage, hydrocephalus, extra-axial collection or mass lesion/mass effect. Vascular: No hyperdense vessel or unexpected calcification. Skull: Normal. Negative for fracture or focal lesion. Sinuses/Orbits: No acute finding. Other: None. CT CERVICAL SPINE FINDINGS Alignment: Mild straightening of the normal cervical lordosis is noted. Skull base and vertebrae: 7 cervical segments are well visualized. Vertebral body height is well maintained. Multilevel disc space narrowing and osteophytic changes are seen. Diffuse facet hypertrophic changes are noted as well. No acute fracture or acute facet abnormality is noted. Soft tissues and spinal canal: Surrounding soft tissue structures are within normal limits. No focal hematoma is seen. Upper chest: Visualized lung apices are unremarkable. Other: None IMPRESSION: CT of the head: No acute intracranial abnormality noted. CT of the cervical spine: Mild straightening of the normal cervical lordosis likely related to muscular spasm. Multilevel facet hypertrophic changes and osteophytic changes without acute abnormality. Electronically Signed   By: Inez Catalina M.D.   On: 06/27/2019 21:38   Ct Cervical Spine Wo Contrast  Result Date: 06/27/2019 CLINICAL DATA:  Recent syncopal episode with head injury and neck pain, initial encounter EXAM: CT HEAD WITHOUT CONTRAST CT CERVICAL SPINE WITHOUT CONTRAST TECHNIQUE: Multidetector CT imaging of the head and cervical spine was performed following the standard protocol without  intravenous contrast. Multiplanar CT image reconstructions of the cervical spine were also generated. COMPARISON:  09/03/2015 FINDINGS: CT HEAD FINDINGS Brain: No evidence of acute infarction, hemorrhage, hydrocephalus, extra-axial collection or mass lesion/mass effect. Vascular: No hyperdense vessel or unexpected calcification. Skull: Normal. Negative for fracture or focal lesion. Sinuses/Orbits: No acute finding. Other: None. CT CERVICAL SPINE FINDINGS Alignment: Mild straightening of the normal cervical lordosis is noted. Skull base and vertebrae: 7 cervical segments are well visualized. Vertebral body height is well maintained. Multilevel disc space narrowing and osteophytic changes are seen. Diffuse facet hypertrophic changes are noted as well. No acute fracture or acute  facet abnormality is noted. Soft tissues and spinal canal: Surrounding soft tissue structures are within normal limits. No focal hematoma is seen. Upper chest: Visualized lung apices are unremarkable. Other: None IMPRESSION: CT of the head: No acute intracranial abnormality noted. CT of the cervical spine: Mild straightening of the normal cervical lordosis likely related to muscular spasm. Multilevel facet hypertrophic changes and osteophytic changes without acute abnormality. Electronically Signed   By: Inez Catalina M.D.   On: 06/27/2019 21:38    Discharge Medications   Allergies as of 06/29/2019      Reactions   Ace Inhibitors    Hypotension and elevated creatinine   Diovan [valsartan] Other (See Comments)   Hypotension at low dose, hyperkalemia   Lipitor [atorvastatin Calcium] Other (See Comments)   Muscle pain   Norvasc [amlodipine] Other (See Comments)   Put patient to sleep      Medication List    TAKE these medications   acetaminophen 500 MG chewable tablet Commonly known as: TYLENOL Chew 500 mg by mouth every 6 (six) hours as needed for pain.   amiodarone 200 MG tablet Commonly known as: PACERONE Take 1 tablet  (200 mg total) by mouth 2 (two) times daily.   aspirin EC 325 MG tablet Take 325 mg by mouth daily.   citalopram 10 MG tablet Commonly known as: CELEXA Take 1 tablet by mouth once daily   CoQ10 200 MG Caps Take 200 mg by mouth daily.   famotidine 20 MG tablet Commonly known as: PEPCID Take 20 mg by mouth 2 (two) times daily.   furosemide 20 MG tablet Commonly known as: LASIX Take 20 mg by mouth daily as needed (Take 20 mg as daily as needed for wt > 153 lbs). Reported on 01/14/2016   hydrALAZINE 50 MG tablet Commonly known as: APRESOLINE TAKE 1 TABLET BY MOUTH THREE TIMES DAILY   levothyroxine 137 MCG tablet Commonly known as: SYNTHROID Take 1 tab by mouth in the morning (except half tablet on Sundays) on an empty stomach.   Melatonin 3 MG Tabs Take 1 tablet by mouth at bedtime.   pantoprazole 40 MG tablet Commonly known as: PROTONIX Take 1 tablet by mouth once daily   potassium chloride SA 20 MEQ tablet Commonly known as: KLOR-CON Take 1 tablet (20 mEq total) by mouth daily as needed. Take one potassium pill when you take your lasix.   PROBIOTIC PO Take 1 capsule by mouth daily.   rosuvastatin 20 MG tablet Commonly known as: CRESTOR Take 1 tablet by mouth once daily   spironolactone 25 MG tablet Commonly known as: ALDACTONE Take 1 tablet (25 mg total) by mouth daily. Take in the evening   traZODone 50 MG tablet Commonly known as: DESYREL Take 1 tablet (50 mg total) by mouth at bedtime. What changed:   when to take this  reasons to take this   warfarin 5 MG tablet Commonly known as: COUMADIN Take as directed. If you are unsure how to take this medication, talk to your nurse or doctor. Original instructions: TAKE 2 TABLETS BY MOUTH ONCE DAILY. TAKE 1 TABLET ON MONDAY AND WEDNESDAY What changed: See the new instructions.       Disposition   The patient will be discharged in stable condition to home. LVAD Speed 9200 Low Speed 8800  Follow-up  Information    Constance Haw, MD Follow up on 10/03/2019.   Specialty: Cardiology Why: at 230 pm for 3 month defibrillator check Contact information: [address] [place] [address] [facility] Follow up on 07/11/2019.   Why: at 1030 for post ICD wound check  Contact information: [place] [phone]            Duration of Discharge Encounter: Greater than 35 minutes   Signed, Darrick Grinder NP-C  06/29/2019, 9:56 AM   Patient seen and examined with the above-signed Advanced Practice Provider and/or Housestaff. I personally reviewed laboratory data, imaging studies and relevant notes. I independently examined the patient and formulated the important aspects of the plan. I have edited the note to reflect any of my changes or salient points. I have personally discussed the plan with the patient and/or family.  He is stable s/p ICD. Ok to d/c home today. VAD interrogated personally. Parameters stable. Continue amio.   Glori Bickers, MD  4:15 PM

## 2019-06-29 NOTE — Progress Notes (Signed)
VAD Coordinator Procedure Note:   VAD Coordinator met patient in EP lab. Pt undergoing generator change out per Dr. Curt Bears. Hemodynamics and VAD parameters monitored by myself and nursing throughout the procedure. Blood pressures were obtained with automatic cuff on left arm.    Time: Doppler Auto  BP Flow PI Power Speed  Pre-procedure:  1711  138/101 (113) 3.7 7.8 4.9 9200                    Sedation Induction: 1742  134/101(112) 4.1 7.9 5.0 9200   1745  125/92(104) 3.7 7.3 4.8 9200   1800  114/87(95) 3.7 7.3 4.8 9200   1815  118/87(97) 3.6 7.3 4.7 9200  Recovery Area: 1830  115/84(95) 3.8 7.3 4.8 9200             Patient tolerated the procedure well. VAD Coordinator accompanied pt back to [facility] for recovery and handoff given to bedside nurse.    Patient Disposition: pt with large pressure dressing over pacer site. Nurse instructed to watch closely overnight.  Tanda Rockers RN, BSN VAD Coordinator 24/7 Pager [phone]

## 2019-06-29 NOTE — Plan of Care (Signed)
  Problem: Education: Goal: Knowledge of General Education information will improve Description: Including pain rating scale, medication(s)/side effects and non-pharmacologic comfort measures Outcome: Completed/Met   Problem: Health Behavior/Discharge Planning: Goal: Ability to manage health-related needs will improve Outcome: Completed/Met   Problem: Clinical Measurements: Goal: Ability to maintain clinical measurements within normal limits will improve Outcome: Completed/Met Goal: Will remain free from infection Outcome: Completed/Met Goal: Diagnostic test results will improve Outcome: Completed/Met Goal: Respiratory complications will improve Outcome: Completed/Met Goal: Cardiovascular complication will be avoided Outcome: Completed/Met   Problem: Activity: Goal: Risk for activity intolerance will decrease Outcome: Completed/Met   Problem: Nutrition: Goal: Adequate nutrition will be maintained Outcome: Completed/Met   Problem: Coping: Goal: Level of anxiety will decrease Outcome: Completed/Met   Problem: Elimination: Goal: Will not experience complications related to bowel motility Outcome: Completed/Met Goal: Will not experience complications related to urinary retention Outcome: Completed/Met   Problem: Pain Managment: Goal: General experience of comfort will improve Outcome: Completed/Met   Problem: Safety: Goal: Ability to remain free from injury will improve Outcome: Completed/Met   Problem: Skin Integrity: Goal: Risk for impaired skin integrity will decrease 06/29/2019 1011 by Don Perking, RN Outcome: Completed/Met 06/29/2019 0815 by Don Perking, RN Outcome: Progressing   Problem: Education: Goal: Patient will understand all VAD equipment and how it functions Outcome: Completed/Met Goal: Patient will be able to verbalize current INR target range and antiplatelet therapy for discharge home Outcome: Completed/Met   Problem:  Cardiac: Goal: LVAD will function as expected and patient will experience no clinical alarms Outcome: Completed/Met

## 2019-06-29 NOTE — Progress Notes (Signed)
Discussed and explained discharge instructions, transitional pharmacy delivered amiodarone. Follow up visit made and discussed with pt. No complaints at this time.

## 2019-06-29 NOTE — Plan of Care (Signed)
  Problem: Skin Integrity: Goal: Risk for impaired skin integrity will decrease Outcome: Progressing   

## 2019-06-30 LAB — HAPTOGLOBIN: Haptoglobin: <10 mg/dL — ABNORMAL LOW (ref 34–355)

## 2019-07-03 ENCOUNTER — Telehealth: Payer: Self-pay | Admitting: *Deleted

## 2019-07-03 NOTE — Telephone Encounter (Signed)
Would we not try to pace him out of this??   I will bring to the [facility] tomorrow and handle.   VAD team - can we bring him to clinic on Tuesday?

## 2019-07-03 NOTE — Telephone Encounter (Signed)
CareAlert received 07/01/19 at 23:35 for AT/AF duration >6hrs, burden 30.8% since 06/29/19. Presenting rhythm and available EGMs indicate A-flutter. As of 07/01/19, A-flutter episode ongoing for >12hrs. No VT/VF episodes.  Spoke with patient. He denies symptoms, reports he has been doing well since he returned home. Compliant with warfarin and amiodarone 200mg  BID. Aware of upcoming appointment in [facility] on 07/06/19. Advised I will call back if any new recommendations from Dr. Curt Bears. Pt in agreement with plan.  Per Dr. Curt Bears, defer to Dr. Haroldine Laws for any changes. Forwarded for review.

## 2019-07-04 ENCOUNTER — Encounter (HOSPITAL_COMMUNITY): Payer: Medicare Other

## 2019-07-04 ENCOUNTER — Other Ambulatory Visit (HOSPITAL_COMMUNITY): Payer: Self-pay | Admitting: *Deleted

## 2019-07-04 DIAGNOSIS — Z95811 Presence of heart assist device: Secondary | ICD-10-CM

## 2019-07-04 DIAGNOSIS — Z7901 Long term (current) use of anticoagulants: Secondary | ICD-10-CM

## 2019-07-04 NOTE — Telephone Encounter (Signed)
Spoke with patient to obtain manual transmission for rhythm confirmation this morning prior to going into office for attempt at [place].  Transmission successful. Presenting rhythm AS/VS @ 78bpm. A-flutter episode first noted on 07/01/19 at 11:20 was 24hr 55min duration. Pt aware he is back in [facility]. Advised I will make VAD team aware so they can provide update about his appointment today. Pt in agreement with plan, denies additional questions at this time.  Molly with VAD team made aware.

## 2019-07-05 ENCOUNTER — Other Ambulatory Visit (HOSPITAL_COMMUNITY): Payer: Medicare Other

## 2019-07-06 ENCOUNTER — Other Ambulatory Visit: Payer: Self-pay

## 2019-07-06 ENCOUNTER — Encounter (HOSPITAL_COMMUNITY): Payer: Medicare Other

## 2019-07-06 ENCOUNTER — Ambulatory Visit (HOSPITAL_COMMUNITY)
Admission: RE | Admit: 2019-07-06 | Discharge: 2019-07-06 | Disposition: A | Discharge status: Home / Self Care | Payer: Medicare Other | Source: Ambulatory Visit | Attending: Internal Medicine | Admitting: Internal Medicine

## 2019-07-06 ENCOUNTER — Other Ambulatory Visit (HOSPITAL_COMMUNITY): Payer: Self-pay | Admitting: *Deleted

## 2019-07-06 ENCOUNTER — Ambulatory Visit (HOSPITAL_COMMUNITY): Payer: Self-pay | Admitting: Pharmacist

## 2019-07-06 ENCOUNTER — Other Ambulatory Visit (HOSPITAL_COMMUNITY): Payer: Self-pay | Admitting: Unknown Physician Specialty

## 2019-07-06 VITALS — BP 97/77 | HR 70 | Ht 69.0 in | Wt 161.0 lb

## 2019-07-06 DIAGNOSIS — M1990 Unspecified osteoarthritis, unspecified site: Secondary | ICD-10-CM | POA: Diagnosis not present

## 2019-07-06 DIAGNOSIS — I480 Paroxysmal atrial fibrillation: Secondary | ICD-10-CM

## 2019-07-06 DIAGNOSIS — I10 Essential (primary) hypertension: Secondary | ICD-10-CM

## 2019-07-06 DIAGNOSIS — I472 Ventricular tachycardia, unspecified: Secondary | ICD-10-CM

## 2019-07-06 DIAGNOSIS — Z95811 Presence of heart assist device: Secondary | ICD-10-CM

## 2019-07-06 DIAGNOSIS — I469 Cardiac arrest, cause unspecified: Secondary | ICD-10-CM | POA: Insufficient documentation

## 2019-07-06 DIAGNOSIS — E7800 Pure hypercholesterolemia, unspecified: Secondary | ICD-10-CM | POA: Insufficient documentation

## 2019-07-06 DIAGNOSIS — N183 Chronic kidney disease, stage 3 unspecified: Secondary | ICD-10-CM | POA: Diagnosis not present

## 2019-07-06 DIAGNOSIS — Z7982 Long term (current) use of aspirin: Secondary | ICD-10-CM | POA: Diagnosis not present

## 2019-07-06 DIAGNOSIS — I252 Old myocardial infarction: Secondary | ICD-10-CM | POA: Insufficient documentation

## 2019-07-06 DIAGNOSIS — Z79899 Other long term (current) drug therapy: Secondary | ICD-10-CM | POA: Insufficient documentation

## 2019-07-06 DIAGNOSIS — I5022 Chronic systolic (congestive) heart failure: Secondary | ICD-10-CM | POA: Diagnosis not present

## 2019-07-06 DIAGNOSIS — Z950 Presence of cardiac pacemaker: Secondary | ICD-10-CM | POA: Diagnosis not present

## 2019-07-06 DIAGNOSIS — Z7901 Long term (current) use of anticoagulants: Secondary | ICD-10-CM

## 2019-07-06 DIAGNOSIS — N179 Acute kidney failure, unspecified: Secondary | ICD-10-CM | POA: Diagnosis not present

## 2019-07-06 DIAGNOSIS — I255 Ischemic cardiomyopathy: Secondary | ICD-10-CM | POA: Insufficient documentation

## 2019-07-06 DIAGNOSIS — I130 Hypertensive heart and chronic kidney disease with heart failure and stage 1 through stage 4 chronic kidney disease, or unspecified chronic kidney disease: Secondary | ICD-10-CM | POA: Insufficient documentation

## 2019-07-06 DIAGNOSIS — E785 Hyperlipidemia, unspecified: Secondary | ICD-10-CM | POA: Diagnosis not present

## 2019-07-06 DIAGNOSIS — F419 Anxiety disorder, unspecified: Secondary | ICD-10-CM | POA: Diagnosis not present

## 2019-07-06 DIAGNOSIS — I2510 Atherosclerotic heart disease of native coronary artery without angina pectoris: Secondary | ICD-10-CM | POA: Diagnosis not present

## 2019-07-06 DIAGNOSIS — E039 Hypothyroidism, unspecified: Secondary | ICD-10-CM | POA: Insufficient documentation

## 2019-07-06 LAB — PROTIME-INR
INR: 2.9 — ABNORMAL HIGH (ref 0.8–1.2)
Prothrombin Time: 30.1 seconds — ABNORMAL HIGH (ref 11.4–15.2)

## 2019-07-06 LAB — BASIC METABOLIC PANEL
Anion gap: 8 (ref 5–15)
BUN: 22 mg/dL (ref 8–23)
CO2: 22 mmol/L (ref 22–32)
Calcium: 8.9 mg/dL (ref 8.9–10.3)
Chloride: 106 mmol/L (ref 98–111)
Creatinine, Ser: 1.99 mg/dL — ABNORMAL HIGH (ref 0.61–1.24)
GFR calc Af Amer: 37 mL/min — ABNORMAL LOW (ref >60)
GFR calc non Af Amer: 32 mL/min — ABNORMAL LOW (ref >60)
Glucose, Bld: 102 mg/dL — ABNORMAL HIGH (ref 70–99)
Potassium: 4.3 mmol/L (ref 3.5–5.1)
Sodium: 136 mmol/L (ref 135–145)

## 2019-07-06 LAB — CBC
HCT: 37.9 % — ABNORMAL LOW (ref 39.0–52.0)
Hemoglobin: 12.4 g/dL — ABNORMAL LOW (ref 13.0–17.0)
MCH: 30.3 pg (ref 26.0–34.0)
MCHC: 32.7 g/dL (ref 30.0–36.0)
MCV: 92.7 fL (ref 80.0–100.0)
Platelets: 178 10*3/uL (ref 150–400)
RBC: 4.09 MIL/uL — ABNORMAL LOW (ref 4.22–5.81)
RDW: 15.2 % (ref 11.5–15.5)
WBC: 6 10*3/uL (ref 4.0–10.5)
nRBC: 0 % (ref 0.0–0.2)

## 2019-07-06 LAB — LACTATE DEHYDROGENASE: LDH: 202 U/L — ABNORMAL HIGH (ref 98–192)

## 2019-07-06 NOTE — Patient Instructions (Signed)
1. No medication changes today 2. Continue current Coumadin regime per Ander Purpura PharmD 3. Return to clinic in 3 weeks full visit

## 2019-07-06 NOTE — Progress Notes (Signed)
LVAD INR 

## 2019-07-06 NOTE — Progress Notes (Signed)
Patient presents for hospital follow up in [facility] today. Reports no problems with VAD equipment or concerns with drive line.   Patient states that he has been feeling ok since surgery. Pt has a large hematoma over his pacer site. Jonni Sanger will see him again in the device clinic next Tuesday.  Jonni Sanger and Mor[place] at bedside for device interrogation and pacer site check.  Vital Signs:  Doppler Pressure: 94 Automatc BP:  97/77 (84) HR: 70 SPO2: 99% on RA  Weight: 161 lb w/o eqt Last weight: 167.8 lb  VAD Indication: Destination therapy- patient choice    VAD interrogation & Equipment Management: Speed: 9200 Flow:  4.9 Power: 5.4 w    PI: 6.9  Alarms: few low voltage  Events: 5- 10 PI events daily  Fixed speed 9200  Low speed limit: 8600  Primary Controller:  Replace back up battery in 14 months Back up controller:  Did not bring back up bag   Annual Equipment Maintenance on UBC/PM was performed on 05/2019  I reviewed the LVAD parameters from today and compared the results to the patient's prior recorded data. LVAD interrogation was NEGATIVE for significant power changes, NEGATIVE for clinical alarms and STABLE for PI events/speed drops. No programming changes were made and pump is functioning within specified parameters. Pt is performing daily controller and system monitor self tests along with completing weekly and monthly maintenance for LVAD equipment.  Exit Site Care: Drive line is being maintained every three days using gauze dressing during heat of summer by [name], his wife. Has transitioned back to weekly dressing changes with Sorbav[place].  Dressing dry and intact. Stabilization device present and accurately applied. Pt denies fever or chills. Pt has CHG allergy. Pt provided with 8 weekly dressing kits.  Significant Events on VAD Support:  10/2013>SBO 12/2016> elevated LDH, admit for [place]  Device:Medtronic ERI Therapies: off Last check:  09/23/17  BP & Labs:   Doppler BP 94 - Doppler is reflecting modified systolic  Hgb 123456 - No S/S of bleeding. Specifically denies melena/BRBPR or nosebleeds.  LDH 202 with established baseline of 200- 250. Denies tea-colored urine. No power elevations noted on interrogation.   Patient Instructions:  1. No medication changes today 2. Continue current Coumadin regime per Ander Purpura PharmD 3. Return to clinic in 3 weeks full visit   Tanda Rockers RN [place] Coordinator  Office: [phone]  24/7 Pager: [phone]

## 2019-07-06 NOTE — Progress Notes (Signed)
VAD CLINIC NOTE   HPI:  Johnny Oneill is a 78 y.o. 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   Admitted 3/4-3/25/15 with SOB and syncopal episode. Found to have SBO and NG placed and started on TPN. On 11/06/13 developed progressive SOB and hypotension and co-ox 51% and Hgb 6.7. Started on milrinone and transfused. Echo showed nl RV function. Patient had PEA arrest and was intubated and started on pressors. CXR revealed a R hemothorax and a R CT placed. He required VATs and evacuation of hemothorax and was then teansferred to CIR.   LVAD Issues  Elevated LDH--bivalirudin. D/C LDH 188  Presented to ER on 06/27/19 with syncopal episode and head injury from fall. ECG showed VT 240. Underwent emergent DC-CV in ER. Head CT with no bleed. Was admitted and had IC gen replacement (EOL). Had small hematoma at site. Amio started.   Earlier this week found to have episode of AFL on ICD remote check but has since resolved.    He presents today for regular follow up. Feels very good. Still with hematoma at ICD site. No skin breakdown. No fevers or chill. Denies orthopnea or PND. No problems with driveline. No bleeding, melena or neuro symptoms. No VAD alarms. Taking all meds as prescribed.     Echo 3/18 EF 10-15%. RV moderate HK. LVAD cannula ok. Mild AI  Echo 9/16 EF 20%, LVAD cannula ok moderate RV dilation with mild to moderately decreased RV systolic function.    VAD Indication: Destination therapy- patient choice    VAD interrogation & Equipment Management: Speed: 9200 Flow:  4.9 Power: 5.4 w PI: 6.9  Alarms: few low voltage  Events: 5- 10 PI events daily  Fixed speed 9200  Low speed limit: 8600  Primary Controller: Replace back up battery in 14 months Back up controller: Did not bring back up bag   Past Medical History:  Diagnosis Date  . Anxiety   . Automatic implantable  cardioverter-defibrillator in situ   . CAD (coronary artery disease)    S/P stenting of LCX in 2004;  s/p Lat MI 2009 with occlusion of the LCX - treated with Promus stenting. Has total occlusion of the RCA.   [name] CHF (congestive heart failure) ([place])   . Hypercholesteremia   . Hypertension   . Hypothyroidism   . ICD (implantable cardiac defibrillator) in place    PROPHYLACTIC      medtronic  . Inferior MI ([place]) "? date"; 2009  . Ischemic cardiomyopathy    a. 10/09 Echo: Sev LV Dysfxn, inf/lat AK, Mod MR.  [name] LVAD (left ventricular assist device) present ([place])   . Osteoarthritis    a. s/p R TKR  . Pacemaker   . PVC (premature ventricular contraction)   . RBBB   . Shortness of breath     Current Outpatient Medications  Medication Sig Dispense Refill  . acetaminophen (TYLENOL) 500 MG chewable tablet Chew 500 mg by mouth every 6 (six) hours as needed for pain.    [name] amiodarone (PACERONE) 200 MG tablet Take 1 tablet (200 mg total) by mouth 2 (two) times daily. 60 tablet 6  . aspirin EC 325 MG tablet Take 325 mg by mouth daily.    . cholecalciferol (VITAMIN D3) 25 MCG (1000 UT) tablet Take 1,000 Units by mouth daily.    . citalopram (CELEXA) 10 MG tablet Take 1 tablet by mouth  once daily 90 tablet 0  . Coenzyme Q10 (COQ10) 200 MG CAPS Take 200 mg by mouth daily.    . famotidine (PEPCID) 20 MG tablet Take 20 mg by mouth daily.     . hydrALAZINE (APRESOLINE) 50 MG tablet TAKE 1 TABLET BY MOUTH THREE TIMES DAILY 270 tablet 3  . levothyroxine (SYNTHROID) 137 MCG tablet Take 1 tab by mouth in the morning (except half tablet on Sundays) on an empty stomach. 90 tablet 1  . Melatonin 3 MG TABS Take 1 tablet by mouth at bedtime.    . pantoprazole (PROTONIX) 40 MG tablet Take 1 tablet by mouth once daily 90 tablet 11  . Probiotic Product (PROBIOTIC PO) Take 1 capsule by mouth daily.    . rosuvastatin (CRESTOR) 20 MG tablet Take 1 tablet by mouth once daily 90 tablet 1  . spironolactone (ALDACTONE) 25  MG tablet Take 1 tablet (25 mg total) by mouth daily. Take in the evening 30 tablet 3  . traZODone (DESYREL) 50 MG tablet Take 1 tablet (50 mg total) by mouth at bedtime. (Patient taking differently: Take 50 mg by mouth at bedtime as needed. ) 30 tablet 5  . warfarin (COUMADIN) 5 MG tablet TAKE 2 TABLETS BY MOUTH ONCE DAILY. TAKE 1 TABLET ON MONDAY AND WEDNESDAY (Patient taking differently: Take 10 mg (2 tab) on T/Th/Sat and 5 mg all other days) 60 tablet 11  . furosemide (LASIX) 20 MG tablet Take 20 mg by mouth daily as needed (Take 20 mg as daily as needed for wt > 153 lbs). Reported on 01/14/2016    . potassium chloride SA (K-DUR,KLOR-CON) 20 MEQ tablet Take 1 tablet (20 mEq total) by mouth daily as needed. Take one potassium pill when you take your lasix. (Patient not taking: Reported on 05/24/2019) 90 tablet 3   No current facility-administered medications for this encounter.    Facility-Administered Medications Ordered in Other Encounters  Medication Dose Route Frequency Provider Last Rate Last Dose  . etomidate (AMIDATE) injection    Anesthesia Intra-op Myna Bright, CRNA   20 mg at 02/08/14 0915  . rocuronium [facility]) injection    Anesthesia Intra-op Myna Bright, CRNA   50 mg at 02/08/14 0932  . succinylcholine (ANECTINE) injection    Anesthesia Intra-op Myna Bright, CRNA   100 mg at 02/08/14 0915   Ace inhibitors, Diovan [valsartan], Lipitor [atorvastatin calcium], and Norvasc [amlodipine]  Review of systems complete and found to be negative unless listed in HPI.    Vitals:   07/06/19 1509  BP: 97/77  Pulse: 70  SpO2: 99%  Weight: 73 kg (161 lb)  Height: 5\' 9"  (1.753 m)    Vital Signs:  Doppler Pressure: 94 Automatc BP:  97/77 (84) HR: 70 SPO2: 99% on RA  Weight: 161 lb w/o eqt Last weight: 167.8 lb    Physical Exam: General:  NAD.  HEENT: normal wound on back of head healing well  Neck: supple. JVP not elevated.  Carotids 2+ bilat; no bruits.  No lymphadenopathy or thryomegaly appreciated. Cor: LVAD hum. Significant hematoma and ecchymosis over left chest. No skin breakdown or erythema/calor  Lungs: Clear. Abdomen: obese soft, nontender, non-distended. No hepatosplenomegaly. No bruits or masses. Good bowel sounds. Driveline site clean. Anchor in place.  Extremities: no cyanosis, clubbing, rash. Warm no edema  Neuro: alert & oriented x 3. No focal deficits. Moves all 4 without problem    ASSESSMENT AND PLAN:   1) VT - possible suction event  as trigger - now s/p ICD gen change - has hematoma at site but EP here managing (thanks) - Continue amio - Keep K> 4.0 Mg > 2.0- No driving x 6 months - ICD interrogated personally with EP today. No further VT. Episode of AFL earlier this week that resolved  2) Chronic systolic HF: ICM, EF 0000000 s/p LVAD HM II implant 08/2013 for DT.   - Echo 3/18 EF 10-15% mild AI. Mod RV dysfunction. LVAD cannula OK - Stable NYHA I  - Volume status looks good. Never uses diuretics.   - Continue spiro 25 mg daily.  - He has not tolerated ACE-I or amlodipine in the past.  3) LVAD: HMII 08/2013 for DT. - VAD interrogated personally. Parameters stable. - Admitted in May 2018 with elevated LDH with bival.  - LDH 202. No power spikes.  - Continue aspirin and coumadin.  - Hgb 12.4 - Driveline site looks good    4) Chronic anticoagulation: INR goal 2.0-3.0.    - INR 2.9 Discussed dosing with PharmD personally. - Continue ASA 325 mg for LVAD + warfarin.   5) HTN:  - Blood pressure well controlled. Continue current regimen.  6. PAFL - now back in NSR - continue amio/coumadin   6) Hypothyroidism:  - Followed by Dr Dwyane Dee. Stable.   7) Hyperlipidemia:  - Continue Crestor. Zetia stopped due to cost.  - No change.   8) CAD -No s/s ischemia  9) AKI on CKD III - creatinine up to 1.99 today (baseline 1.5-1.9) - will follow. Encouraged him to stay hydrated   Total time spent 45 minutes. Over half  that time spent discussing above.    Glori Bickers, MD  6:59 PM

## 2019-07-11 ENCOUNTER — Ambulatory Visit (INDEPENDENT_AMBULATORY_CARE_PROVIDER_SITE_OTHER): Payer: Medicare Other | Admitting: Student

## 2019-07-11 ENCOUNTER — Other Ambulatory Visit: Payer: Self-pay

## 2019-07-11 DIAGNOSIS — I472 Ventricular tachycardia, unspecified: Secondary | ICD-10-CM

## 2019-07-11 DIAGNOSIS — I5022 Chronic systolic (congestive) heart failure: Secondary | ICD-10-CM

## 2019-07-11 LAB — CUP PACEART INCLINIC DEVICE CHECK
Battery Remaining Longevity: 130 mo
Battery Voltage: 3.15 V
Brady Statistic AP VP Percent: 0.03 %
Brady Statistic AP VS Percent: 38.23 %
Brady Statistic AS VP Percent: 0.08 %
Brady Statistic AS VS Percent: 61.66 %
Brady Statistic RA Percent Paced: 38.11 %
Brady Statistic RV Percent Paced: 0.11 %
Date Time Interrogation Session: 20201110112938
HighPow Impedance: 37 Ohm
HighPow Impedance: 46 Ohm
Implantable Lead Implant Date: 20091103
Implantable Lead Implant Date: 20091103
Implantable Lead Location: 753859
Implantable Lead Location: 753860
Implantable Lead Model: 5076
Implantable Lead Model: 6947
Implantable Pulse Generator Implant Date: 20201028
Lead Channel Impedance Value: 342 Ohm
Lead Channel Impedance Value: 418 Ohm
Lead Channel Impedance Value: 456 Ohm
Lead Channel Pacing Threshold Amplitude: 0.5 V
Lead Channel Pacing Threshold Amplitude: 1.375 V
Lead Channel Pacing Threshold Pulse Width: 0.4 ms
Lead Channel Pacing Threshold Pulse Width: 0.4 ms
Lead Channel Sensing Intrinsic Amplitude: 2.625 mV
Lead Channel Sensing Intrinsic Amplitude: 2.875 mV
Lead Channel Sensing Intrinsic Amplitude: 7.75 mV
Lead Channel Sensing Intrinsic Amplitude: 8.25 mV
Lead Channel Setting Pacing Amplitude: 1.5 V
Lead Channel Setting Pacing Amplitude: 2.75 V
Lead Channel Setting Pacing Pulse Width: 0.6 ms
Lead Channel Setting Sensing Sensitivity: 0.3 mV

## 2019-07-11 NOTE — Progress Notes (Signed)
Wound check appointment. Steri-strips removed. Wound with ecchymosis and hematoma improving from last week. Incision edges approximated, wound well healed. Dr. Lovena Le visualized and assessed. Normal device function. Thresholds, sensing, and impedances consistent with implant measurements. Device programmed at appropriate safety margins with auto capture. Histogram distribution appropriate for patient and level of activity. No mode switches or ventricular arrhythmias noted. Patient educated about wound care, arm mobility, lifting restrictions, shock plan. ROV in 3 months with Dr. Curt Bears. Recheck hematoma in 2 weeks. Continue pocket pal.

## 2019-07-13 ENCOUNTER — Ambulatory Visit (HOSPITAL_COMMUNITY)
Admission: RE | Admit: 2019-07-13 | Discharge: 2019-07-13 | Disposition: A | Discharge status: Home / Self Care | Payer: Medicare Other | Source: Ambulatory Visit | Attending: Internal Medicine | Admitting: Internal Medicine

## 2019-07-13 ENCOUNTER — Ambulatory Visit (HOSPITAL_COMMUNITY): Payer: Self-pay | Admitting: Pharmacist

## 2019-07-13 ENCOUNTER — Other Ambulatory Visit: Payer: Self-pay

## 2019-07-13 DIAGNOSIS — Z7901 Long term (current) use of anticoagulants: Secondary | ICD-10-CM

## 2019-07-13 DIAGNOSIS — Z95811 Presence of heart assist device: Secondary | ICD-10-CM | POA: Diagnosis not present

## 2019-07-13 LAB — PROTIME-INR
INR: 3.6 — ABNORMAL HIGH (ref 0.8–1.2)
Prothrombin Time: 35.1 seconds — ABNORMAL HIGH (ref 11.4–15.2)

## 2019-07-13 NOTE — Progress Notes (Signed)
LVAD INR 

## 2019-07-14 ENCOUNTER — Other Ambulatory Visit (HOSPITAL_COMMUNITY): Payer: Self-pay | Admitting: *Deleted

## 2019-07-14 DIAGNOSIS — Z7901 Long term (current) use of anticoagulants: Secondary | ICD-10-CM

## 2019-07-14 DIAGNOSIS — Z95811 Presence of heart assist device: Secondary | ICD-10-CM

## 2019-07-19 ENCOUNTER — Other Ambulatory Visit (HOSPITAL_COMMUNITY): Payer: Self-pay | Admitting: *Deleted

## 2019-07-19 ENCOUNTER — Other Ambulatory Visit (HOSPITAL_COMMUNITY): Payer: Self-pay | Admitting: Internal Medicine

## 2019-07-19 DIAGNOSIS — Z95811 Presence of heart assist device: Secondary | ICD-10-CM

## 2019-07-19 DIAGNOSIS — I472 Ventricular tachycardia, unspecified: Secondary | ICD-10-CM

## 2019-07-19 DIAGNOSIS — I10 Essential (primary) hypertension: Secondary | ICD-10-CM

## 2019-07-19 MED ORDER — AMIODARONE HCL 200 MG PO TABS: 200.0000 mg | ORAL_TABLET | Freq: Two times a day (BID) | ORAL | 6 refills | Status: DC

## 2019-07-20 ENCOUNTER — Other Ambulatory Visit: Payer: Self-pay

## 2019-07-20 ENCOUNTER — Ambulatory Visit (HOSPITAL_COMMUNITY)
Admission: RE | Admit: 2019-07-20 | Discharge: 2019-07-20 | Disposition: A | Discharge status: Home / Self Care | Payer: Medicare Other | Source: Ambulatory Visit | Attending: Internal Medicine | Admitting: Internal Medicine

## 2019-07-20 ENCOUNTER — Ambulatory Visit (HOSPITAL_COMMUNITY): Payer: Self-pay | Admitting: Pharmacist

## 2019-07-20 DIAGNOSIS — Z95811 Presence of heart assist device: Secondary | ICD-10-CM | POA: Insufficient documentation

## 2019-07-20 DIAGNOSIS — Z7901 Long term (current) use of anticoagulants: Secondary | ICD-10-CM | POA: Insufficient documentation

## 2019-07-20 LAB — PROTIME-INR
INR: 3.1 — ABNORMAL HIGH (ref 0.8–1.2)
Prothrombin Time: 32.2 seconds — ABNORMAL HIGH (ref 11.4–15.2)

## 2019-07-20 NOTE — H&P (Signed)
ICD Criteria  Current LVEF:<20%. Within 12 months prior to implant: Yes   Heart failure history: Yes, Class II  Cardiomyopathy history: Yes, Mixed Ischemic and Non-Ischemic Cardiomyopathies.  Atrial Fibrillation/Atrial Flutter: No.  Ventricular tachycardia history: Yes, Hemodynamic instability present. VT Type: Sustained Ventricular Tachycardia - Monomorphic.  Cardiac arrest history: Yes, Ventricular Tachycardia.  History of syndromes with risk of sudden death: No.  Previous ICD: Yes, Reason for ICD:  Primary prevention.  Current ICD indication: Secondary  PPM indication: No.  Class I or II Bradycardia indication present: No  Beta Blocker therapy for 3 or more months: Yes, prescribed.   Ace Inhibitor/ARB therapy for 3 or more months: Yes, prescribed.    I have seen Johnny Oneill is a 78 y.o. malepre-procedural and has been referred by [place] for consideration of ICD implant for secondary prevention of sudden death.  The patient's chart has been reviewed and they meet criteria for ICD implant.  I have had a thorough discussion with the patient reviewing options.  The patient and their family (if available) have had opportunities to ask questions and have them answered. The patient and I have decided together through the [place] [employer] to implant ICD at this time.  Risks, benefits, alternatives to ICD implantation were discussed in detail with the patient today. The patient  understands that the risks include but are not limited to bleeding, infection, pneumothorax, perforation, tamponade, vascular damage, renal failure, MI, stroke, death, inappropriate shocks, and lead dislodgement and  wishes to proceed.

## 2019-07-20 NOTE — Progress Notes (Signed)
LVAD INR 

## 2019-07-25 ENCOUNTER — Ambulatory Visit (INDEPENDENT_AMBULATORY_CARE_PROVIDER_SITE_OTHER): Payer: Medicare Other | Admitting: Student

## 2019-07-25 ENCOUNTER — Other Ambulatory Visit: Payer: Self-pay

## 2019-07-25 DIAGNOSIS — T148XXA Other injury of unspecified body region, initial encounter: Secondary | ICD-10-CM

## 2019-07-25 NOTE — Progress Notes (Signed)
Pt seen for continued observation of ICD pocket hematoma.   Hematoma persists, but has decreased in size and ecchymosis has improved greatly.  Soft to the touch with no tenderness, erythema, or warmth.  Pt intermittently using Pocket PAL, mostly in the day. Which is appropriate at this point.   Dr. Curt Bears in to assess and agrees it is improving.   Continue to monitor INR. (has been complicated by amiodarone).   Pt knows to call with any issues. Otherwise, follow up as scheduled with Dr. Curt Bears.   Legrand Como [address]" [place], PA-C  07/25/2019 10:09 AM

## 2019-07-26 ENCOUNTER — Other Ambulatory Visit (HOSPITAL_COMMUNITY): Payer: Self-pay | Admitting: Unknown Physician Specialty

## 2019-07-26 DIAGNOSIS — Z7901 Long term (current) use of anticoagulants: Secondary | ICD-10-CM

## 2019-07-26 DIAGNOSIS — Z95811 Presence of heart assist device: Secondary | ICD-10-CM

## 2019-07-31 ENCOUNTER — Other Ambulatory Visit: Payer: Self-pay

## 2019-07-31 ENCOUNTER — Encounter (HOSPITAL_COMMUNITY): Payer: Self-pay | Admitting: *Deleted

## 2019-07-31 ENCOUNTER — Ambulatory Visit (HOSPITAL_COMMUNITY): Payer: Self-pay | Admitting: Pharmacist

## 2019-07-31 ENCOUNTER — Ambulatory Visit (HOSPITAL_COMMUNITY)
Admission: RE | Admit: 2019-07-31 | Discharge: 2019-07-31 | Disposition: A | Discharge status: Home / Self Care | Payer: Medicare Other | Source: Ambulatory Visit | Attending: Cardiology | Admitting: Cardiology

## 2019-07-31 ENCOUNTER — Encounter (HOSPITAL_COMMUNITY): Payer: Self-pay

## 2019-07-31 VITALS — BP 93/75 | HR 72 | Temp 98.6°F | Ht 69.0 in | Wt 168.0 lb

## 2019-07-31 DIAGNOSIS — I252 Old myocardial infarction: Secondary | ICD-10-CM | POA: Diagnosis not present

## 2019-07-31 DIAGNOSIS — I130 Hypertensive heart and chronic kidney disease with heart failure and stage 1 through stage 4 chronic kidney disease, or unspecified chronic kidney disease: Secondary | ICD-10-CM | POA: Diagnosis not present

## 2019-07-31 DIAGNOSIS — I472 Ventricular tachycardia, unspecified: Secondary | ICD-10-CM

## 2019-07-31 DIAGNOSIS — I255 Ischemic cardiomyopathy: Secondary | ICD-10-CM | POA: Insufficient documentation

## 2019-07-31 DIAGNOSIS — F419 Anxiety disorder, unspecified: Secondary | ICD-10-CM | POA: Diagnosis not present

## 2019-07-31 DIAGNOSIS — I10 Essential (primary) hypertension: Secondary | ICD-10-CM

## 2019-07-31 DIAGNOSIS — Z7901 Long term (current) use of anticoagulants: Secondary | ICD-10-CM | POA: Insufficient documentation

## 2019-07-31 DIAGNOSIS — Z950 Presence of cardiac pacemaker: Secondary | ICD-10-CM | POA: Insufficient documentation

## 2019-07-31 DIAGNOSIS — Z79899 Other long term (current) drug therapy: Secondary | ICD-10-CM | POA: Diagnosis not present

## 2019-07-31 DIAGNOSIS — Z7982 Long term (current) use of aspirin: Secondary | ICD-10-CM | POA: Diagnosis not present

## 2019-07-31 DIAGNOSIS — E7800 Pure hypercholesterolemia, unspecified: Secondary | ICD-10-CM | POA: Insufficient documentation

## 2019-07-31 DIAGNOSIS — Z95811 Presence of heart assist device: Secondary | ICD-10-CM | POA: Insufficient documentation

## 2019-07-31 DIAGNOSIS — E785 Hyperlipidemia, unspecified: Secondary | ICD-10-CM | POA: Diagnosis not present

## 2019-07-31 DIAGNOSIS — E039 Hypothyroidism, unspecified: Secondary | ICD-10-CM | POA: Diagnosis not present

## 2019-07-31 DIAGNOSIS — N179 Acute kidney failure, unspecified: Secondary | ICD-10-CM | POA: Insufficient documentation

## 2019-07-31 DIAGNOSIS — M1990 Unspecified osteoarthritis, unspecified site: Secondary | ICD-10-CM | POA: Diagnosis not present

## 2019-07-31 DIAGNOSIS — I5022 Chronic systolic (congestive) heart failure: Secondary | ICD-10-CM | POA: Insufficient documentation

## 2019-07-31 DIAGNOSIS — N183 Chronic kidney disease, stage 3 unspecified: Secondary | ICD-10-CM | POA: Insufficient documentation

## 2019-07-31 DIAGNOSIS — I2510 Atherosclerotic heart disease of native coronary artery without angina pectoris: Secondary | ICD-10-CM | POA: Insufficient documentation

## 2019-07-31 LAB — BASIC METABOLIC PANEL
Anion gap: 10 (ref 5–15)
BUN: 25 mg/dL — ABNORMAL HIGH (ref 8–23)
CO2: 25 mmol/L (ref 22–32)
Calcium: 8.9 mg/dL (ref 8.9–10.3)
Chloride: 107 mmol/L (ref 98–111)
Creatinine, Ser: 1.85 mg/dL — ABNORMAL HIGH (ref 0.61–1.24)
GFR calc Af Amer: 40 mL/min — ABNORMAL LOW (ref >60)
GFR calc non Af Amer: 35 mL/min — ABNORMAL LOW (ref >60)
Glucose, Bld: 67 mg/dL — ABNORMAL LOW (ref 70–99)
Potassium: 4.2 mmol/L (ref 3.5–5.1)
Sodium: 142 mmol/L (ref 135–145)

## 2019-07-31 LAB — PROTIME-INR
INR: 3.4 — ABNORMAL HIGH (ref 0.8–1.2)
Prothrombin Time: 34.1 seconds — ABNORMAL HIGH (ref 11.4–15.2)

## 2019-07-31 LAB — CBC
HCT: 41.3 % (ref 39.0–52.0)
Hemoglobin: 13.3 g/dL (ref 13.0–17.0)
MCH: 30.6 pg (ref 26.0–34.0)
MCHC: 32.2 g/dL (ref 30.0–36.0)
MCV: 94.9 fL (ref 80.0–100.0)
Platelets: 149 10*3/uL — ABNORMAL LOW (ref 150–400)
RBC: 4.35 MIL/uL (ref 4.22–5.81)
RDW: 15.8 % — ABNORMAL HIGH (ref 11.5–15.5)
WBC: 5.5 10*3/uL (ref 4.0–10.5)
nRBC: 0 % (ref 0.0–0.2)

## 2019-07-31 LAB — LACTATE DEHYDROGENASE: LDH: 251 U/L — ABNORMAL HIGH (ref 98–192)

## 2019-07-31 MED ORDER — WARFARIN SODIUM 5 MG PO TABS: ORAL_TABLET | ORAL | 11 refills | Status: DC

## 2019-07-31 NOTE — Progress Notes (Signed)
LVAD INR 

## 2019-07-31 NOTE — Progress Notes (Signed)
Patient presents for hospital follow up in [facility] today. Reports no problems with VAD equipment or concerns with drive line.   Patient states that he has been feeling ok, denies any further shocks from device. Has had two appointments in [facility] since generator change. Left shoulder incision healing, large soft hematoma remains, pt reports "it has gone down a lot" since initial implant.   Optivol performed - Dr. Haroldine Laws reviewed.   Vital Signs:  Temp: 98.6 Doppler Pressure: 80 Automatc BP:  93/75 (82) HR: 72 SPO2: 97% on RA  Weight: 161 lb w/o eqt Last weight: 167.8 lb  VAD Indication: Destination therapy- patient choice    VAD interrogation & Equipment Management: Speed: 9200 Flow:  4.8 Power: 5.3w    PI: 6.0  Alarms: few low voltage  Events: 5- 10 PI events daily  Fixed speed 9200  Low speed limit: 8600  Primary Controller:  Replace back up battery in 14 months Back up controller:  Did not bring back up bag   Annual Equipment Maintenance on UBC/PM was performed on 05/2019  I reviewed the LVAD parameters from today and compared the results to the patient's prior recorded data. LVAD interrogation was NEGATIVE for significant power changes, NEGATIVE for clinical alarms and STABLE for PI events/speed drops. No programming changes were made and pump is functioning within specified parameters. Pt is performing daily controller and system monitor self tests along with completing weekly and monthly maintenance for LVAD equipment.  Exit Site Care: Drive line is being maintained every three days using gauze dressing during heat of summer by [name], his wife. Has transitioned back to weekly dressing changes with [employer].  Dressing dry and intact. Stabilization device present and accurately applied. Pt denies fever or chills. Pt has CHG allergy. Pt provided with 4 weekly dressing kits.  Significant Events on VAD Support:  10/2013>SBO 12/2016> elevated LDH, admit for  Bival  Device:Dual Medtronic Therapies:  V therapy on at 222 bpm; V monitor on 171         A therapy on 171 bpm (ramp and burst overdrive paing); A monitor on 300 bpm  Pacing: AAI<=>DDD 60 bpm  Last check: 07/11/19  BP & Labs:  Doppler 80 - Doppler is reflecting modified systolic  Hgb 99991111 - No S/S of bleeding. Specifically denies melena/BRBPR or nosebleeds.  LDH 251 and is within established baseline of 200- 250. Denies tea-colored urine. No power elevations noted on interrogation.   Patient Instructions:  1. No medication changes today 2. Coumadin dosing per Ander Purpura PharmD 3. Return to clinic in 2 mos full visit  Lucas Duchesne Coordinator  Office: [phone]  24/7 Pager: [phone]

## 2019-07-31 NOTE — Progress Notes (Signed)
VAD CLINIC NOTE   HPI:  Johnny Oneill is a 78 y.o. 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   Admitted 3/4-3/25/15 with SOB and syncopal episode. Found to have SBO and NG placed and started on TPN. On 11/06/13 developed progressive SOB and hypotension and co-ox 51% and Hgb 6.7. Started on milrinone and transfused. Echo showed nl RV function. Patient had PEA arrest and was intubated and started on pressors. CXR revealed a R hemothorax and a R CT placed. He required VATs and evacuation of hemothorax and was then teansferred to CIR.   LVAD Issues  Elevated LDH--bivalirudin. D/C LDH 188  Presented to ER on 06/27/19 with syncopal episode and head injury from fall. ECG showed VT 240. Underwent emergent DC-CV in ER. Head CT with no bleed. Was admitted and had IC gen replacement (EOL). Had small hematoma at site. Amio started. Post-discharge found to have episode of AFL on ICD remote check but has since resolved.    He presents today for regular follow up. Feels good. No further palpitations or presyncope. Still with hematoma at ICD site but improving. Denies orthopnea or PND. No fevers, chills or problems with driveline. No bleeding, melena or neuro symptoms. No VAD alarms. Taking all meds as prescribed.   ICD interrogated personally in clinic. No VT/AF. Optivol looks good. Personally reviewed   Echo 3/18 EF 10-15%. RV moderate HK. LVAD cannula ok. Mild AI  Echo 9/16 EF 20%, LVAD cannula ok moderate RV dilation with mild to moderately decreased RV systolic function.    VAD Indication: Destination therapy- patient choice    VAD interrogation & Equipment Management: Speed: 9200 Flow:  4.8 Power: 5.3w PI: 6.0  Alarms: few low voltage  Events: 5- 10 PI events daily  Fixed speed 9200  Low speed limit: 8600  Primary Controller: Replace back up battery in 14 months Back up controller: Did  not bring back up bag   Past Medical History:  Diagnosis Date  . Anxiety   . Automatic implantable cardioverter-defibrillator in situ   . CAD (coronary artery disease)    S/P stenting of LCX in 2004;  s/p Lat MI 2009 with occlusion of the LCX - treated with Promus stenting. Has total occlusion of the RCA.   [name] CHF (congestive heart failure) ([place])   . Hypercholesteremia   . Hypertension   . Hypothyroidism   . ICD (implantable cardiac defibrillator) in place    PROPHYLACTIC      medtronic  . Inferior MI ([place]) "? date"; 2009  . Ischemic cardiomyopathy    a. 10/09 Echo: Sev LV Dysfxn, inf/lat AK, Mod MR.  [name] LVAD (left ventricular assist device) present ([place])   . Osteoarthritis    a. s/p R TKR  . Pacemaker   . PVC (premature ventricular contraction)   . RBBB   . Shortness of breath     Current Outpatient Medications  Medication Sig Dispense Refill  . acetaminophen (TYLENOL) 500 MG chewable tablet Chew 500 mg by mouth every 6 (six) hours as needed for pain.    [name] amiodarone (PACERONE) 200 MG tablet Take 1 tablet (200 mg total) by mouth 2 (two) times daily. 60 tablet 6  . aspirin EC 325 MG tablet Take 325 mg by mouth daily.    . cholecalciferol (VITAMIN D3) 25 MCG (1000 UT) tablet Take 1,000 Units by mouth daily.    . citalopram (  CELEXA) 10 MG tablet Take 1 tablet by mouth once daily 90 tablet 0  . Coenzyme Q10 (COQ10) 200 MG CAPS Take 200 mg by mouth daily.    . famotidine (PEPCID) 20 MG tablet Take 20 mg by mouth daily.     . hydrALAZINE (APRESOLINE) 50 MG tablet TAKE 1 TABLET BY MOUTH THREE TIMES DAILY 270 tablet 3  . levothyroxine (SYNTHROID) 137 MCG tablet Take 1 tab by mouth in the morning (except half tablet on Sundays) on an empty stomach. 90 tablet 1  . Melatonin 3 MG TABS Take 1 tablet by mouth at bedtime.    . pantoprazole (PROTONIX) 40 MG tablet Take 1 tablet by mouth once daily 90 tablet 11  . Probiotic Product (PROBIOTIC PO) Take 1 capsule by mouth daily.    .  rosuvastatin (CRESTOR) 20 MG tablet Take 1 tablet by mouth once daily 90 tablet 1  . spironolactone (ALDACTONE) 25 MG tablet Take 1 tablet by mouth once daily 90 tablet 0  . traZODone (DESYREL) 50 MG tablet Take 1 tablet (50 mg total) by mouth at bedtime. (Patient taking differently: Take 50 mg by mouth at bedtime as needed. ) 30 tablet 5  . warfarin (COUMADIN) 5 MG tablet Take 5 mg (1 tab) daily or as directed by HF clinic 60 tablet 11  . furosemide (LASIX) 20 MG tablet Take 20 mg by mouth daily as needed (Take 20 mg as daily as needed for wt > 153 lbs). Reported on 01/14/2016    . potassium chloride SA (K-DUR,KLOR-CON) 20 MEQ tablet Take 1 tablet (20 mEq total) by mouth daily as needed. Take one potassium pill when you take your lasix. (Patient not taking: Reported on 05/24/2019) 90 tablet 3   No current facility-administered medications for this encounter.    Facility-Administered Medications Ordered in Other Encounters  Medication Dose Route Frequency Provider Last Rate Last Dose  . etomidate (AMIDATE) injection    Anesthesia Intra-op Myna Bright, CRNA   20 mg at 02/08/14 0915  . rocuronium [facility]) injection    Anesthesia Intra-op Myna Bright, CRNA   50 mg at 02/08/14 0932  . succinylcholine (ANECTINE) injection    Anesthesia Intra-op Myna Bright, CRNA   100 mg at 02/08/14 0915   Ace inhibitors, Diovan [valsartan], Lipitor [atorvastatin calcium], and Norvasc [amlodipine]  Review of systems complete and found to be negative unless listed in HPI.    Vitals:   07/31/19 1315 07/31/19 1316  BP: (!) 80/0 93/75  Pulse:  72  Temp:  98.6 F (37 C)  SpO2:  97%  Weight:  76.2 kg (168 lb)  Height:  5\' 9"  (1.753 m)     Vital Signs:  Temp: 98.6 Doppler Pressure: 80 Automatc BP:  93/75 (82) HR: 72 SPO2: 97% on RA  Weight: 161 lb w/o eqt Last weight: 167.8 lb  Physical Exam: General:  NAD.  HEENT: normal  Neck: supple. JVP not elevated.  Carotids 2+ bilat; no  bruits. No lymphadenopathy or thryomegaly appreciated. Cor: LVAD hum. Hematoma over ICD site Lungs: Clear. Abdomen: obese soft, nontender, non-distended. No hepatosplenomegaly. No bruits or masses. Good bowel sounds. Driveline site clean. Anchor in place.  Extremities: no cyanosis, clubbing, rash. Warm no edema  Neuro: alert & oriented x 3. No focal deficits. Moves all 4 without problem     ASSESSMENT AND PLAN:   1) VT - stable. No further events. Suspect possible suction event as trigger - now s/p ICD gen change -  has hematoma at site but EP continuing to manage - Continue po amio - Keep K> 4.0 Mg > 2.0- No driving x 6 months - ICD interrogated personally No VT/AF  2) Chronic systolic HF: ICM, EF 0000000 s/p LVAD HM II implant 08/2013 for DT.   - Echo 3/18 EF 10-15% mild AI. Mod RV dysfunction. LVAD cannula OK - Stable NYHA I - Volume status looks good. Does not require lasix.  - Continue spiro 25 mg daily.  - He has not tolerated ACE-I or amlodipine in the past.  3) LVAD: HMII 08/2013 for DT. - VAD interrogated personally. Parameters stable. - Admitted in May 2018 with elevated LDH with bival.  - LDH 251. No power spikes.  - Continue aspirin and coumadin.  - Hgb 13.3 - Driveline site looks good  - MAPs ok   4) Chronic anticoagulation: INR goal 2.0-3.0.    - INR 3.4 Discussed dosing with PharmD personally. - Continue ASA 325 mg for LVAD + warfarin.   5) HTN:  - Blood pressure well controlled. Continue current regimen.  6. PAFL - now back in NSR - continue amio/coumadin   6) Hypothyroidism:  - Followed by Dr Dwyane Dee. Stable.   7) Hyperlipidemia:  - Continue Crestor. Zetia stopped due to cost.  - No change.   8) CAD -No s/s ischemia  9) AKI on CKD III - creatinine down from 1.99 -> 1.85 today (baseline 1.5-1.9) - will follow. Encouraged him to stay hydrated  - May consider Farxiga  Total time spent 35 minutes. Over half that time spent discussing above.     Glori Bickers, MD  2:43 PM

## 2019-08-02 ENCOUNTER — Encounter (HOSPITAL_COMMUNITY): Payer: BLUE CROSS/BLUE SHIELD

## 2019-08-10 ENCOUNTER — Telehealth (HOSPITAL_COMMUNITY): Payer: Self-pay | Admitting: *Deleted

## 2019-08-10 NOTE — Telephone Encounter (Signed)
Called pt's wife per Dr. Haroldine Laws. Informed her that patient needs to decrease ASA to 81 mg daily. Wife verbalized understanding of same.   Zada Girt RN, [place] Coordinator [phone]

## 2019-08-11 ENCOUNTER — Other Ambulatory Visit (HOSPITAL_COMMUNITY): Payer: Self-pay | Admitting: *Deleted

## 2019-08-11 DIAGNOSIS — Z95811 Presence of heart assist device: Secondary | ICD-10-CM

## 2019-08-11 DIAGNOSIS — Z7901 Long term (current) use of anticoagulants: Secondary | ICD-10-CM

## 2019-08-15 ENCOUNTER — Ambulatory Visit (HOSPITAL_COMMUNITY): Payer: Self-pay | Admitting: Pharmacist

## 2019-08-15 ENCOUNTER — Other Ambulatory Visit: Payer: Self-pay

## 2019-08-15 ENCOUNTER — Ambulatory Visit (HOSPITAL_COMMUNITY)
Admission: RE | Admit: 2019-08-15 | Discharge: 2019-08-15 | Disposition: A | Discharge status: Home / Self Care | Payer: Medicare Other | Source: Ambulatory Visit | Attending: Cardiology | Admitting: Cardiology

## 2019-08-15 DIAGNOSIS — Z7901 Long term (current) use of anticoagulants: Secondary | ICD-10-CM | POA: Diagnosis not present

## 2019-08-15 DIAGNOSIS — Z95811 Presence of heart assist device: Secondary | ICD-10-CM | POA: Diagnosis not present

## 2019-08-15 LAB — LACTATE DEHYDROGENASE: LDH: 210 U/L — ABNORMAL HIGH (ref 98–192)

## 2019-08-15 LAB — PROTIME-INR
INR: 2.2 — ABNORMAL HIGH (ref 0.8–1.2)
Prothrombin Time: 24.4 seconds — ABNORMAL HIGH (ref 11.4–15.2)

## 2019-08-15 MED ORDER — WARFARIN SODIUM 5 MG PO TABS: ORAL_TABLET | ORAL | 11 refills | Status: DC

## 2019-08-15 NOTE — Progress Notes (Signed)
LVAD INR 

## 2019-08-18 ENCOUNTER — Other Ambulatory Visit: Payer: Self-pay

## 2019-08-18 ENCOUNTER — Other Ambulatory Visit (INDEPENDENT_AMBULATORY_CARE_PROVIDER_SITE_OTHER): Payer: Medicare Other

## 2019-08-18 DIAGNOSIS — E890 Postprocedural hypothyroidism: Secondary | ICD-10-CM

## 2019-08-18 LAB — TSH: TSH: 9.28 u[IU]/mL — ABNORMAL HIGH (ref 0.35–4.50)

## 2019-08-18 LAB — T4, FREE: Free T4: 1.26 ng/dL (ref 0.60–1.60)

## 2019-08-22 ENCOUNTER — Ambulatory Visit (INDEPENDENT_AMBULATORY_CARE_PROVIDER_SITE_OTHER): Payer: Medicare Other | Admitting: Endocrinology

## 2019-08-22 ENCOUNTER — Other Ambulatory Visit: Payer: Self-pay

## 2019-08-22 ENCOUNTER — Encounter: Payer: Self-pay | Admitting: Endocrinology

## 2019-08-22 DIAGNOSIS — I255 Ischemic cardiomyopathy: Secondary | ICD-10-CM | POA: Diagnosis not present

## 2019-08-22 DIAGNOSIS — E890 Postprocedural hypothyroidism: Secondary | ICD-10-CM

## 2019-08-22 MED ORDER — EUTHYROX 150 MCG PO TABS: ORAL_TABLET | ORAL | 1 refills | Status: DC

## 2019-08-22 NOTE — Progress Notes (Signed)
Patient ID: Johnny Oneill, male   DOB: 1941-03-26, 78 y.o.   MRN: [id number]   Reason for Appointment:  Hypothyroidism, followup visit  I connected with the above-named patient by video enabled telemedicine application and verified that I am speaking with the correct person. The patient was explained the limitations of evaluation and management by telemedicine and the availability of in person appointments.  Patient also understood that there may be a patient responsible charge related to this service . Location of the patient: Patient's home . Location of the provider: Physician office Only the patient and myself were participating in the encounter The patient understood the above statements and agreed to proceed.   History of Present Illness:   The hypothyroidism was first diagnosed after radioactive iodine treatment for [name] several years ago  Previously he had lost significant amount of weight with his worsening heart failure and subsequently this stabilized More recently his weight has improved somewhat and is stable.  Previously was taking 200 mcg daily after 3/15 when he was hospitalized   Subsequently the dose was reduced gradually   Current regimen: he has been taking 137 mcg, 6-1/2 tablets a week since his last visit in 05/2018  He has not complained of any fatigue, sluggishness, increased sleepiness in the evenings or cold intolerance He may have gained about 4 to 5 pounds recently, previously had been trying to lose weight He gets his prescription from [facility] as before and not clear if his manufacturer has changed, he thinks he is getting blue pill instead of a green pill now  He is very regular with taking the tablet in the morning at least 30 minutes before breakfast with water.   Does not take any iron or calcium-containing vitamins at the same time  His TSH which was slightly lower is now significantly higher at 9.3 Free T4 appears to be  fluctuating independently   Wt Readings from Last 3 Encounters:  07/31/19 168 lb (76.2 kg)  07/06/19 161 lb (73 kg)  06/29/19 163 lb 14.4 oz (74.3 kg)    Lab Results  Component Value Date   TSH 9.28 (H) 08/18/2019   TSH 0.32 (L) 05/17/2019   TSH 0.94 05/30/2018   FREET4 1.26 08/18/2019   FREET4 1.09 05/17/2019   FREET4 1.18 05/30/2018      Allergies as of 08/22/2019      Reactions   Ace Inhibitors    Hypotension and elevated creatinine   Diovan [valsartan] Other (See Comments)   Hypotension at low dose, hyperkalemia   Lipitor [atorvastatin Calcium] Other (See Comments)   Muscle pain   Norvasc [amlodipine] Other (See Comments)   Put patient to sleep      Medication List       Accurate as of August 22, 2019  9:19 AM. If you have any questions, ask your nurse or doctor.        acetaminophen 500 MG chewable tablet Commonly known as: TYLENOL Chew 500 mg by mouth every 6 (six) hours as needed for pain.   amiodarone 200 MG tablet Commonly known as: PACERONE Take 1 tablet (200 mg total) by mouth 2 (two) times daily.   aspirin EC 81 MG tablet Take 81 mg by mouth daily.   cholecalciferol 25 MCG (1000 UT) tablet Commonly known as: VITAMIN D3 Take 1,000 Units by mouth daily.   citalopram 10 MG tablet Commonly known as: CELEXA Take 1 tablet by mouth once daily   CoQ10 200 MG  Caps Take 200 mg by mouth daily.   famotidine 20 MG tablet Commonly known as: PEPCID Take 20 mg by mouth daily.   furosemide 20 MG tablet Commonly known as: LASIX Take 20 mg by mouth daily as needed (Take 20 mg as daily as needed for wt > 153 lbs). Reported on 01/14/2016   hydrALAZINE 50 MG tablet Commonly known as: APRESOLINE TAKE 1 TABLET BY MOUTH THREE TIMES DAILY   levothyroxine 137 MCG tablet Commonly known as: SYNTHROID Take 1 tab by mouth in the morning (except half tablet on Sundays) on an empty stomach.   Melatonin 3 MG Tabs Take 1 tablet by mouth at bedtime.     pantoprazole 40 MG tablet Commonly known as: PROTONIX Take 1 tablet by mouth once daily   potassium chloride SA 20 MEQ tablet Commonly known as: KLOR-CON Take 1 tablet (20 mEq total) by mouth daily as needed. Take one potassium pill when you take your lasix.   PROBIOTIC PO Take 1 capsule by mouth daily.   rosuvastatin 20 MG tablet Commonly known as: CRESTOR Take 1 tablet by mouth once daily   spironolactone 25 MG tablet Commonly known as: ALDACTONE Take 1 tablet by mouth once daily   traZODone 50 MG tablet Commonly known as: DESYREL Take 1 tablet (50 mg total) by mouth at bedtime. What changed:   when to take this  reasons to take this   warfarin 5 MG tablet Commonly known as: COUMADIN Take as directed by the anticoagulation clinic. If you are unsure how to take this medication, talk to your nurse or doctor. Original instructions: Take 7.5 mg(1.5 tabs) every Tuesday and 5 mg (1 tab) all other days or as directed by HF clinic        Past Medical History:  Diagnosis Date  . Anxiety   . Automatic implantable cardioverter-defibrillator in situ   . CAD (coronary artery disease)    S/P stenting of LCX in 2004;  s/p Lat MI 2009 with occlusion of the LCX - treated with Promus stenting. Has total occlusion of the RCA.   [name] CHF (congestive heart failure) ([place])   . Hypercholesteremia   . Hypertension   . Hypothyroidism   . ICD (implantable cardiac defibrillator) in place    PROPHYLACTIC      medtronic  . Inferior MI ([place]) "? date"; 2009  . Ischemic cardiomyopathy    a. 10/09 Echo: Sev LV Dysfxn, inf/lat AK, Mod MR.  [name] LVAD (left ventricular assist device) present ([place])   . Osteoarthritis    a. s/p R TKR  . Pacemaker   . PVC (premature ventricular contraction)   . RBBB   . Shortness of breath     Past Surgical History:  Procedure Laterality Date  . CARPAL TUNNEL RELEASE Right 05/29/2016   Procedure: CARPAL TUNNEL RELEASE;  Surgeon: Earlie Server, MD;  Location: [place];  Service: Orthopedics;  Laterality: Right;  . CORONARY ANGIOPLASTY WITH STENT PLACEMENT  2004  . CORONARY ANGIOPLASTY WITH STENT PLACEMENT  07/2008  . DEFIBRILLATOR  2009  . ESOPHAGOGASTRODUODENOSCOPY N/A 09/15/2013   Procedure: ESOPHAGOGASTRODUODENOSCOPY (EGD);  Surgeon: Ladene Artist, MD;  Location: [facility] ENDOSCOPY;  Service: Endoscopy;  Laterality: N/A;  bedside  . HEMATOMA EVACUATION Right 11/06/2013   Procedure: EVACUATION HEMATOMA;  Surgeon: Gaye Pollack, MD;  Location: [place];  Service: Cardiothoracic;  Laterality: Right;  . ICD GENERATOR CHANGEOUT N/A 06/28/2019   Procedure: ICD GENERATOR CHANGEOUT;  Surgeon: Constance Haw, MD;  Location:  [place] INVASIVE CV LAB;  Service: Cardiovascular;  Laterality: N/A;  . INSERT / REPLACE / REMOVE PACEMAKER  2009  . INSERTION OF IMPLANTABLE LEFT VENTRICULAR ASSIST DEVICE N/A 09/18/2013   Procedure: INSERTION OF IMPLANTABLE LEFT VENTRICULAR ASSIST DEVICE;  Surgeon: Ivin Poot, MD;  Location: [place];  Service: Open Heart Surgery;  Laterality: N/A;  CIRC ARREST  NITRIC OXIDE  . INTRA-AORTIC BALLOON PUMP INSERTION N/A 09/14/2013   Procedure: INTRA-AORTIC BALLOON PUMP INSERTION;  Surgeon: Jolaine Artist, MD;  Location: [facility] CATH LAB;  Service: Cardiovascular;  Laterality: N/A;  . INTRAOPERATIVE TRANSESOPHAGEAL ECHOCARDIOGRAM N/A 09/18/2013   Procedure: INTRAOPERATIVE TRANSESOPHAGEAL ECHOCARDIOGRAM;  Surgeon: Ivin Poot, MD;  Location: [place];  Service: Open Heart Surgery;  Laterality: N/A;  . KNEE ARTHROSCOPY     bilaterally  . KNEE ARTHROSCOPY W/ PARTIAL MEDIAL MENISCECTOMY  12/2002   left  . LEFT VENTRICULAR ASSIST DEVICE    . RIGHT HEART CATHETERIZATION N/A 08/25/2013   Procedure: RIGHT HEART CATH;  Surgeon: Jolaine Artist, MD;  Location: [facility] CATH LAB;  Service: Cardiovascular;  Laterality: N/A;  . RIGHT HEART CATHETERIZATION N/A 09/13/2013   Procedure: RIGHT HEART CATH;  Surgeon: Jolaine Artist, MD;  Location: [facility] CATH LAB;   Service: Cardiovascular;  Laterality: N/A;  . RIGHT HEART CATHETERIZATION N/A 02/08/2014   Procedure: RIGHT HEART CATH;  Surgeon: Jolaine Artist, MD;  Location: [facility] CATH LAB;  Service: Cardiovascular;  Laterality: N/A;  . RIGHT HEART CATHETERIZATION N/A 02/12/2014   Procedure: RIGHT HEART CATH;  Surgeon: Jolaine Artist, MD;  Location: [facility] CATH LAB;  Service: Cardiovascular;  Laterality: N/A;  . TOTAL KNEE ARTHROPLASTY  11/27/11   left  . TOTAL KNEE ARTHROPLASTY  11/27/2011   Procedure: TOTAL KNEE ARTHROPLASTY;  Surgeon: Yvette Rack., MD;  Location: [place];  Service: Orthopedics;  Laterality: Left;  left total knee arthroplasty  . VIDEO ASSISTED THORACOSCOPY Right 11/06/2013   Procedure: VIDEO ASSISTED THORACOSCOPY;  Surgeon: Gaye Pollack, MD;  Location: [facility] OR;  Service: Cardiothoracic;  Laterality: Right;    Family History  Problem Relation Age of Onset  . Heart failure Father   . Stroke Father   . Coronary artery disease Mother   . Heart disease Mother   . Hyperlipidemia Sister     Social History:  reports that he has never smoked. He has never used smokeless tobacco. He reports that he does not drink alcohol or use drugs.  Allergies:  Allergies  Allergen Reactions  . Ace Inhibitors     Hypotension and elevated creatinine  . Diovan [Valsartan] Other (See Comments)    Hypotension at low dose, hyperkalemia  . Lipitor [Atorvastatin Calcium] Other (See Comments)    Muscle pain  . Norvasc [Amlodipine] Other (See Comments)    Put patient to sleep   ROS:  Cardiomyopathy: He has been treated with a ventricle assist device and he is on anticoagulation     Examination:   There were no vitals taken for this visit.  Exam not done, patient remote    Assessment/Plan:   Post ablative Hypothyroidism:   He is now appearing to be hypothyroid with a TSH 9.3 He is very regular with taking his levothyroxine as prescribed every morning and not clear why his TSH has  fluctuated  It is likely that he has been given a different brand of generic from his [place]  I have discussed his regimen of taking his thyroid pills and he is taking this  on empty stomach without any interacting supplements and no change in his compliance  Since his TSH is significantly high we will have him go up to 150 mcg, 6-1/2 tablets a week and try to get him the Euthyrox brand consistently. Follow-up in 2 months again He will call if he has any new symptoms  There are no Patient Instructions on file for this visit.    Elayne Snare 08/22/2019, 9:19 AM

## 2019-08-23 ENCOUNTER — Other Ambulatory Visit (HOSPITAL_COMMUNITY): Payer: Self-pay | Admitting: Unknown Physician Specialty

## 2019-08-23 DIAGNOSIS — Z7901 Long term (current) use of anticoagulants: Secondary | ICD-10-CM

## 2019-08-23 DIAGNOSIS — Z95811 Presence of heart assist device: Secondary | ICD-10-CM

## 2019-08-28 ENCOUNTER — Other Ambulatory Visit (HOSPITAL_COMMUNITY): Payer: Self-pay | Admitting: Internal Medicine

## 2019-08-28 DIAGNOSIS — E785 Hyperlipidemia, unspecified: Secondary | ICD-10-CM

## 2019-08-28 DIAGNOSIS — I5043 Acute on chronic combined systolic (congestive) and diastolic (congestive) heart failure: Secondary | ICD-10-CM

## 2019-08-30 ENCOUNTER — Ambulatory Visit (HOSPITAL_COMMUNITY): Payer: Self-pay | Admitting: Pharmacist

## 2019-08-30 ENCOUNTER — Ambulatory Visit (HOSPITAL_COMMUNITY)
Admission: RE | Admit: 2019-08-30 | Discharge: 2019-08-30 | Disposition: A | Discharge status: Home / Self Care | Payer: Medicare Other | Source: Ambulatory Visit | Attending: Cardiology | Admitting: Cardiology

## 2019-08-30 ENCOUNTER — Other Ambulatory Visit: Payer: Self-pay

## 2019-08-30 DIAGNOSIS — Z7901 Long term (current) use of anticoagulants: Secondary | ICD-10-CM | POA: Diagnosis not present

## 2019-08-30 DIAGNOSIS — Z95811 Presence of heart assist device: Secondary | ICD-10-CM | POA: Insufficient documentation

## 2019-08-30 LAB — PROTIME-INR
INR: 3.6 — ABNORMAL HIGH (ref 0.8–1.2)
Prothrombin Time: 35.8 seconds — ABNORMAL HIGH (ref 11.4–15.2)

## 2019-08-30 LAB — LACTATE DEHYDROGENASE: LDH: 248 U/L — ABNORMAL HIGH (ref 98–192)

## 2019-08-30 MED ORDER — WARFARIN SODIUM 5 MG PO TABS: ORAL_TABLET | ORAL | 11 refills | Status: DC

## 2019-08-30 NOTE — Progress Notes (Signed)
LVAD INR 

## 2019-08-31 ENCOUNTER — Other Ambulatory Visit (HOSPITAL_COMMUNITY): Payer: Self-pay | Admitting: *Deleted

## 2019-08-31 DIAGNOSIS — Z95811 Presence of heart assist device: Secondary | ICD-10-CM

## 2019-08-31 DIAGNOSIS — Z7901 Long term (current) use of anticoagulants: Secondary | ICD-10-CM

## 2019-09-05 ENCOUNTER — Ambulatory Visit: Payer: Medicare Other | Attending: Internal Medicine

## 2019-09-05 DIAGNOSIS — Z20822 Contact with and (suspected) exposure to covid-19: Secondary | ICD-10-CM

## 2019-09-06 ENCOUNTER — Ambulatory Visit (HOSPITAL_COMMUNITY)
Admission: RE | Admit: 2019-09-06 | Discharge: 2019-09-06 | Disposition: A | Discharge status: Home / Self Care | Payer: Medicare Other | Source: Ambulatory Visit | Attending: Internal Medicine | Admitting: Internal Medicine

## 2019-09-06 ENCOUNTER — Ambulatory Visit (HOSPITAL_COMMUNITY): Payer: Self-pay | Admitting: Pharmacist

## 2019-09-06 ENCOUNTER — Other Ambulatory Visit: Payer: Self-pay

## 2019-09-06 DIAGNOSIS — Z7901 Long term (current) use of anticoagulants: Secondary | ICD-10-CM | POA: Diagnosis not present

## 2019-09-06 DIAGNOSIS — Z95811 Presence of heart assist device: Secondary | ICD-10-CM | POA: Diagnosis not present

## 2019-09-06 LAB — PROTIME-INR
INR: 2.7 — ABNORMAL HIGH (ref 0.8–1.2)
Prothrombin Time: 28.3 seconds — ABNORMAL HIGH (ref 11.4–15.2)

## 2019-09-06 LAB — LACTATE DEHYDROGENASE: LDH: 263 U/L — ABNORMAL HIGH (ref 98–192)

## 2019-09-06 NOTE — Progress Notes (Signed)
LVAD INR 

## 2019-09-07 LAB — NOVEL CORONAVIRUS, NAA: SARS-CoV-2, NAA: NOT DETECTED

## 2019-09-15 ENCOUNTER — Other Ambulatory Visit (HOSPITAL_COMMUNITY): Payer: Self-pay | Admitting: Unknown Physician Specialty

## 2019-09-15 DIAGNOSIS — Z95811 Presence of heart assist device: Secondary | ICD-10-CM

## 2019-09-15 DIAGNOSIS — Z7901 Long term (current) use of anticoagulants: Secondary | ICD-10-CM

## 2019-09-19 ENCOUNTER — Other Ambulatory Visit (HOSPITAL_COMMUNITY): Payer: Self-pay | Admitting: Internal Medicine

## 2019-09-19 DIAGNOSIS — I255 Ischemic cardiomyopathy: Secondary | ICD-10-CM

## 2019-09-20 ENCOUNTER — Ambulatory Visit (HOSPITAL_COMMUNITY)
Admission: RE | Admit: 2019-09-20 | Discharge: 2019-09-20 | Disposition: A | Discharge status: Home / Self Care | Payer: Medicare Other | Source: Ambulatory Visit | Attending: Internal Medicine | Admitting: Internal Medicine

## 2019-09-20 ENCOUNTER — Other Ambulatory Visit: Payer: Self-pay

## 2019-09-20 ENCOUNTER — Ambulatory Visit (HOSPITAL_COMMUNITY): Payer: Self-pay | Admitting: Pharmacist

## 2019-09-20 DIAGNOSIS — Z7901 Long term (current) use of anticoagulants: Secondary | ICD-10-CM | POA: Insufficient documentation

## 2019-09-20 DIAGNOSIS — Z95811 Presence of heart assist device: Secondary | ICD-10-CM | POA: Diagnosis not present

## 2019-09-20 DIAGNOSIS — Z5181 Encounter for therapeutic drug level monitoring: Secondary | ICD-10-CM | POA: Insufficient documentation

## 2019-09-20 LAB — PROTIME-INR
INR: 2.9 — ABNORMAL HIGH (ref 0.8–1.2)
Prothrombin Time: 30.2 seconds — ABNORMAL HIGH (ref 11.4–15.2)

## 2019-09-20 NOTE — Progress Notes (Signed)
LVAD INR 

## 2019-09-28 ENCOUNTER — Ambulatory Visit (INDEPENDENT_AMBULATORY_CARE_PROVIDER_SITE_OTHER): Payer: Medicare Other | Admitting: *Deleted

## 2019-09-28 DIAGNOSIS — R55 Syncope and collapse: Secondary | ICD-10-CM | POA: Diagnosis not present

## 2019-09-28 LAB — CUP PACEART REMOTE DEVICE CHECK
Battery Remaining Longevity: 126 mo
Battery Voltage: 3.14 V
Brady Statistic AP VP Percent: 0.04 %
Brady Statistic AP VS Percent: 44.6 %
Brady Statistic AS VP Percent: 0.08 %
Brady Statistic AS VS Percent: 55.28 %
Brady Statistic RA Percent Paced: 44.54 %
Brady Statistic RV Percent Paced: 0.13 %
Date Time Interrogation Session: 20210128022723
HighPow Impedance: 37 Ohm
HighPow Impedance: 46 Ohm
Implantable Lead Implant Date: 20091103
Implantable Lead Implant Date: 20091103
Implantable Lead Location: 753859
Implantable Lead Location: 753860
Implantable Lead Model: 5076
Implantable Lead Model: 6947
Implantable Pulse Generator Implant Date: 20201028
Lead Channel Impedance Value: 342 Ohm
Lead Channel Impedance Value: 399 Ohm
Lead Channel Impedance Value: 418 Ohm
Lead Channel Pacing Threshold Amplitude: 0.5 V
Lead Channel Pacing Threshold Amplitude: 1.375 V
Lead Channel Pacing Threshold Pulse Width: 0.4 ms
Lead Channel Pacing Threshold Pulse Width: 0.4 ms
Lead Channel Sensing Intrinsic Amplitude: 1.875 mV
Lead Channel Sensing Intrinsic Amplitude: 1.875 mV
Lead Channel Sensing Intrinsic Amplitude: 3.25 mV
Lead Channel Sensing Intrinsic Amplitude: 3.25 mV
Lead Channel Setting Pacing Amplitude: 1.5 V
Lead Channel Setting Pacing Amplitude: 2.75 V
Lead Channel Setting Pacing Pulse Width: 0.6 ms
Lead Channel Setting Sensing Sensitivity: 0.3 mV

## 2019-09-28 NOTE — Progress Notes (Signed)
ICD Remote  

## 2019-09-30 ENCOUNTER — Ambulatory Visit: Payer: Medicare Other

## 2019-10-02 ENCOUNTER — Ambulatory Visit: Payer: Medicare Other

## 2019-10-03 ENCOUNTER — Encounter: Payer: Self-pay | Admitting: Cardiology

## 2019-10-03 ENCOUNTER — Ambulatory Visit (INDEPENDENT_AMBULATORY_CARE_PROVIDER_SITE_OTHER): Payer: Medicare Other | Admitting: Cardiology

## 2019-10-03 ENCOUNTER — Other Ambulatory Visit: Payer: Self-pay

## 2019-10-03 VITALS — BP 110/66 | HR 76 | Ht 69.0 in | Wt 170.8 lb

## 2019-10-03 DIAGNOSIS — I5022 Chronic systolic (congestive) heart failure: Secondary | ICD-10-CM

## 2019-10-03 DIAGNOSIS — I255 Ischemic cardiomyopathy: Secondary | ICD-10-CM | POA: Diagnosis not present

## 2019-10-03 DIAGNOSIS — M25561 Pain in right knee: Secondary | ICD-10-CM | POA: Diagnosis not present

## 2019-10-03 NOTE — Patient Instructions (Signed)
Medication Instructions:  Your physician recommends that you continue on your current medications as directed. Please refer to the Current Medication list given to you today.  *If you need a refill on your cardiac medications before your next appointment, please call your pharmacy*  Labwork: None ordered  Testing/Procedures: None ordered  Follow-Up: Remote monitoring is used to monitor your Pacemaker or ICD from home. This monitoring reduces the number of office visits required to check your device to one time per year. It allows [place] to keep an eye on the functioning of your device to ensure it is working properly. You are scheduled for a device check from home on 12/28/19. You may send your transmission at any time that day. If you have a wireless device, the transmission will be sent automatically. After your physician reviews your transmission, you will receive a postcard with your next transmission date.  At [facility], you and your health needs are our priority.  As part of our continuing mission to provide you with exceptional heart care, we have created designated Provider Care Teams.  These Care Teams include your primary Cardiologist (physician) and Advanced Practice Providers (APPs -  Physician Assistants and Nurse Practitioners) who all work together to provide you with the care you need, when you need it.  You will need a follow up appointment in 9 months.  Please call our office 2 months in advance to schedule this appointment.  You may see Dr Curt Bears or one of the following Advanced Practice Providers on your designated Care Team:    Chanetta Marshall, NP  Tommye Standard, PA-C  [name], [place]  Thank you for choosing [facility]!!   Trinidad Curet, RN [phone]  Any Other Special Instructions Will Be Listed Below (If Applicable).

## 2019-10-03 NOTE — Progress Notes (Signed)
Electrophysiology Office Note   Date:  10/03/2019   ID:  [name], Oneill 10/10/1940, MRN [id number]  PCP:  Lavone Orn, MD  Cardiologist:  [place] Primary Electrophysiologist:  Jonhatan Hearty Meredith Leeds, MD    Chief Complaint: CHF   History of Present Illness: SAAGAR Oneill is a 79 y.o. male who is being seen today for the evaluation of CHF at the request of Lavone Orn, MD. Presenting today for electrophysiology evaluation.  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  He was admitted to the hospital October 2020 after an episode of syncope.  He was found to be in ventricular tachycardia.  He had a Medtronic ICD generator change 09/27/2018.  This was complicated by device site hematoma.  Today, he denies symptoms of palpitations, chest pain, shortness of breath, orthopnea, PND, lower extremity edema, claudication, dizziness, presyncope, syncope, bleeding, or neurologic sequela. The patient is tolerating medications without difficulties.    Past Medical History:  Diagnosis Date  . Anxiety   . Automatic implantable cardioverter-defibrillator in situ   . CAD (coronary artery disease)    S/P stenting of LCX in 2004;  s/p Lat MI 2009 with occlusion of the LCX - treated with Promus stenting. Has total occlusion of the RCA.   [name] CHF (congestive heart failure) ([place])   . Hypercholesteremia   . Hypertension   . Hypothyroidism   . ICD (implantable cardiac defibrillator) in place    PROPHYLACTIC      medtronic  . Inferior MI ([place]) "? date"; 2009  . Ischemic cardiomyopathy    a. 10/09 Echo: Sev LV Dysfxn, inf/lat AK, Mod MR.  [name] LVAD (left ventricular assist device) present ([place])   . Osteoarthritis    a. s/p R TKR  . Pacemaker   . PVC (premature ventricular contraction)   . RBBB   . Shortness of breath    Past Surgical History:  Procedure Laterality Date  . CARPAL TUNNEL RELEASE  Right 05/29/2016   Procedure: CARPAL TUNNEL RELEASE;  Surgeon: Earlie Server, MD;  Location: [place];  Service: Orthopedics;  Laterality: Right;  . CORONARY ANGIOPLASTY WITH STENT PLACEMENT  2004  . CORONARY ANGIOPLASTY WITH STENT PLACEMENT  07/2008  . DEFIBRILLATOR  2009  . ESOPHAGOGASTRODUODENOSCOPY N/A 09/15/2013   Procedure: ESOPHAGOGASTRODUODENOSCOPY (EGD);  Surgeon: Ladene Artist, MD;  Location: [facility] ENDOSCOPY;  Service: Endoscopy;  Laterality: N/A;  bedside  . HEMATOMA EVACUATION Right 11/06/2013   Procedure: EVACUATION HEMATOMA;  Surgeon: Gaye Pollack, MD;  Location: [facility];  Service: Cardiothoracic;  Laterality: Right;  . ICD GENERATOR CHANGEOUT N/A 06/28/2019   Procedure: ICD GENERATOR CHANGEOUT;  Surgeon: Constance Haw, MD;  Location: [facility] CV LAB;  Service: Cardiovascular;  Laterality: N/A;  . INSERT / REPLACE / REMOVE PACEMAKER  2009  . INSERTION OF IMPLANTABLE LEFT VENTRICULAR ASSIST DEVICE N/A 09/18/2013   Procedure: INSERTION OF IMPLANTABLE LEFT VENTRICULAR ASSIST DEVICE;  Surgeon: Ivin Poot, MD;  Location: [facility];  Service: Open Heart Surgery;  Laterality: N/A;  CIRC ARREST  NITRIC OXIDE  . INTRA-AORTIC BALLOON PUMP INSERTION N/A 09/14/2013   Procedure: INTRA-AORTIC BALLOON PUMP INSERTION;  Surgeon: Jolaine Artist, MD;  Location: [facility] CATH LAB;  Service: Cardiovascular;  Laterality: N/A;  . INTRAOPERATIVE TRANSESOPHAGEAL ECHOCARDIOGRAM N/A 09/18/2013   Procedure: INTRAOPERATIVE TRANSESOPHAGEAL ECHOCARDIOGRAM;  Surgeon: Ivin Poot, MD;  Location: [facility];  Service: Open Heart Surgery;  Laterality: N/A;  .  KNEE ARTHROSCOPY     bilaterally  . KNEE ARTHROSCOPY W/ PARTIAL MEDIAL MENISCECTOMY  12/2002   left  . LEFT VENTRICULAR ASSIST DEVICE    . RIGHT HEART CATHETERIZATION N/A 08/25/2013   Procedure: RIGHT HEART CATH;  Surgeon: Jolaine Artist, MD;  Location: [facility] CATH LAB;  Service: Cardiovascular;  Laterality: N/A;  . RIGHT HEART CATHETERIZATION N/A 09/13/2013     Procedure: RIGHT HEART CATH;  Surgeon: Jolaine Artist, MD;  Location: [facility] CATH LAB;  Service: Cardiovascular;  Laterality: N/A;  . RIGHT HEART CATHETERIZATION N/A 02/08/2014   Procedure: RIGHT HEART CATH;  Surgeon: Jolaine Artist, MD;  Location: [facility] CATH LAB;  Service: Cardiovascular;  Laterality: N/A;  . RIGHT HEART CATHETERIZATION N/A 02/12/2014   Procedure: RIGHT HEART CATH;  Surgeon: Jolaine Artist, MD;  Location: [facility] CATH LAB;  Service: Cardiovascular;  Laterality: N/A;  . TOTAL KNEE ARTHROPLASTY  11/27/11   left  . TOTAL KNEE ARTHROPLASTY  11/27/2011   Procedure: TOTAL KNEE ARTHROPLASTY;  Surgeon: Yvette Rack., MD;  Location: [place];  Service: Orthopedics;  Laterality: Left;  left total knee arthroplasty  . VIDEO ASSISTED THORACOSCOPY Right 11/06/2013   Procedure: VIDEO ASSISTED THORACOSCOPY;  Surgeon: Gaye Pollack, MD;  Location: [facility] OR;  Service: Cardiothoracic;  Laterality: Right;     Current Outpatient Medications  Medication Sig Dispense Refill  . acetaminophen (TYLENOL) 500 MG chewable tablet Chew 500 mg by mouth every 6 (six) hours as needed for pain.    [name] amiodarone (PACERONE) 200 MG tablet Take 1 tablet (200 mg total) by mouth 2 (two) times daily. 60 tablet 6  . aspirin EC 81 MG tablet Take 81 mg by mouth daily.    . cholecalciferol (VITAMIN D3) 25 MCG (1000 UT) tablet Take 1,000 Units by mouth daily.    . citalopram (CELEXA) 10 MG tablet Take 1 tablet by mouth once daily 90 tablet 0  . clindamycin (CLEOCIN) 150 MG capsule Take 4 capsules by mouth as needed. Prior to dental appointments    . Coenzyme Q10 (COQ10) 200 MG CAPS Take 200 mg by mouth daily.    . EUTHYROX 150 MCG tablet 1 tablet before breakfast daily except half tablet on Mondays 90 tablet 1  . famotidine (PEPCID) 20 MG tablet Take 20 mg by mouth daily.     . furosemide (LASIX) 20 MG tablet Take 20 mg by mouth daily as needed (Take 20 mg as daily as needed for wt > 153 lbs). Reported on 01/14/2016    .  hydrALAZINE (APRESOLINE) 50 MG tablet TAKE 1 TABLET BY MOUTH THREE TIMES DAILY 270 tablet 3  . Melatonin 3 MG TABS Take 1 tablet by mouth at bedtime.    . pantoprazole (PROTONIX) 40 MG tablet Take 1 tablet by mouth once daily 90 tablet 11  . potassium chloride SA (K-DUR,KLOR-CON) 20 MEQ tablet Take 1 tablet (20 mEq total) by mouth daily as needed. Take one potassium pill when you take your lasix. 90 tablet 3  . Probiotic Product (PROBIOTIC PO) Take 1 capsule by mouth daily.    . rosuvastatin (CRESTOR) 20 MG tablet Take 1 tablet by mouth once daily 90 tablet 3  . spironolactone (ALDACTONE) 25 MG tablet Take 1 tablet by mouth once daily 90 tablet 0  . traZODone (DESYREL) 50 MG tablet Take 1 tablet (50 mg total) by mouth at bedtime. (Patient taking differently: Take 50 mg by mouth at bedtime as needed. ) 30 tablet 5  .  warfarin (COUMADIN) 5 MG tablet Take 2.5 mg(1/2 tab) every Monday/Friday and 5 mg (1 tab) all other days or as directed by HF clinic 60 tablet 11   No current facility-administered medications for this visit.   Facility-Administered Medications Ordered in Other Visits  Medication Dose Route Frequency Provider Last Rate Last Admin  . etomidate (AMIDATE) injection    Anesthesia Intra-op Myna Bright, CRNA   20 mg at 02/08/14 0915  . rocuronium [facility]) injection    Anesthesia Intra-op Myna Bright, CRNA   50 mg at 02/08/14 0932  . succinylcholine (ANECTINE) injection    Anesthesia Intra-op Myna Bright, CRNA   100 mg at 02/08/14 0915    Allergies:   Ace inhibitors, Diovan [valsartan], Lipitor [atorvastatin calcium], and Norvasc [amlodipine]   Social History:  The patient  reports that he has never smoked. He has never used smokeless tobacco. He reports that he does not drink alcohol or use drugs.   Family History:  The patient's family history includes Coronary artery disease in his mother; Heart disease in his mother; Heart failure in his father; Hyperlipidemia in  his sister; Stroke in his father.    ROS:  Please see the history of present illness.   Otherwise, review of systems is positive for none.   All other systems are reviewed and negative.    PHYSICAL EXAM: VS:  BP 110/66   Pulse 76   Ht 5\' 9"  (1.753 m)   Wt 170 lb 12.8 oz (77.5 kg)   SpO2 96%   BMI 25.22 kg/m  , BMI Body mass index is 25.22 kg/m. GEN: Well nourished, well developed, in no acute distress  HEENT: normal  Neck: no JVD, carotid bruits, or masses Cardiac: LVAD hum, RRR; no murmurs, rubs, or gallops,no edema  Respiratory:  clear to auscultation bilaterally, normal work of breathing GI: soft, nontender, nondistended, + BS MS: no deformity or atrophy  Skin: warm and dry, device pocket is well healed Neuro:  Strength and sensation are intact Psych: euthymic mood, full affect  EKG:  EKG is ordered today. Personal review of the ekg ordered shows sinus rhythm, IVCD, rate 76  Device interrogation is reviewed today in detail.  See PaceArt for details.   Recent Labs: 06/27/2019: ALT 22; Magnesium 1.9 07/31/2019: BUN 25; Creatinine, Ser 1.85; Hemoglobin 13.3; Platelets 149; Potassium 4.2; Sodium 142 08/18/2019: TSH 9.28    Lipid Panel     Component Value Date/Time   CHOL 93 05/28/2015 0943   TRIG 112.0 05/28/2015 0943   HDL 41.50 05/28/2015 0943   CHOLHDL 2 05/28/2015 0943   VLDL 22.4 05/28/2015 0943   LDLCALC 29 05/28/2015 0943     Wt Readings from Last 3 Encounters:  10/03/19 170 lb 12.8 oz (77.5 kg)  07/31/19 168 lb (76.2 kg)  07/06/19 161 lb (73 kg)      Other studies Reviewed: Additional studies/ records that were reviewed today include: TTE 06/28/19  Review of the above records today demonstrates:  1. Left ventricular ejection fraction, by visual estimation, is <20%. The  left ventricle has severely decreased function. Left ventricular septal  wall thickness was normal. Normal left ventricular posterior wall  thickness. There is no left ventricular   hypertrophy.  2. LVAD: LVAD inflow cannula well-seated and without septal  contact.summary.  3. Left ventricular diastolic parameters are consistent with Grade I  diastolic dysfunction (impaired relaxation).  4. Moderately dilated left ventricular internal cavity size.  5. Global right ventricle has moderately  reduced systolic function.The  right ventricular size is normal. No increase in right ventricular wall  thickness.  6. Left atrial size was mildly dilated.  7. Right atrial size was normal.  8. The mitral valve is normal in structure. Moderate mitral valve  regurgitation. No evidence of mitral stenosis.  9. The tricuspid valve is normal in structure. Tricuspid valve  regurgitation is trivial.  10. Aortic valve regurgitation is moderate.  11. The aortic valve is tricuspid. Aortic valve regurgitation is moderate.  No evidence of aortic valve sclerosis or stenosis.  12. Moderate, continuous aortic regurgitation.  13. The pulmonic valve was normal in structure. Pulmonic valve  regurgitation is not visualized.  14. Mildly elevated pulmonary artery systolic pressure.  15. The inferior vena cava is dilated in size with >50% respiratory  variability, suggesting right atrial pressure of 8 mmHg.    ASSESSMENT AND PLAN:  1.  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  He is now status post Medtronic ICD generator change.  Device functioning appropriately.  No changes.  2.  Chronic systolic heart failure mixed cardiomyopathy: Status post HeartMate 2 LVAD.  Plan per heart failure cardiology.  Current medicines are reviewed at length with the patient today.   The patient does not have concerns regarding his medicines.  The following changes were made today:  none  Labs/ tests ordered today include:  No orders of the defined types were placed in this encounter.    Disposition:   FU with Hanako Tipping 9 months  Signed, Delano Frate Meredith Leeds, MD  10/03/2019  3:01 PM     [place] [address] [place] [address] [phone] (office) [phone] (fax)

## 2019-10-04 ENCOUNTER — Encounter (HOSPITAL_COMMUNITY): Payer: Medicare Other

## 2019-10-09 ENCOUNTER — Other Ambulatory Visit (HOSPITAL_COMMUNITY): Payer: Self-pay | Admitting: *Deleted

## 2019-10-09 DIAGNOSIS — Z7901 Long term (current) use of anticoagulants: Secondary | ICD-10-CM

## 2019-10-09 DIAGNOSIS — Z79899 Other long term (current) drug therapy: Secondary | ICD-10-CM

## 2019-10-09 DIAGNOSIS — Z95811 Presence of heart assist device: Secondary | ICD-10-CM

## 2019-10-09 NOTE — Addendum Note (Signed)
Addended by: Rose Phi on: 10/09/2019 08:54 AM   Modules accepted: Orders

## 2019-10-10 ENCOUNTER — Ambulatory Visit (HOSPITAL_COMMUNITY)
Admission: RE | Admit: 2019-10-10 | Discharge: 2019-10-10 | Disposition: A | Discharge status: Home / Self Care | Payer: Medicare Other | Source: Ambulatory Visit | Attending: Internal Medicine | Admitting: Internal Medicine

## 2019-10-10 ENCOUNTER — Encounter (HOSPITAL_COMMUNITY): Payer: Self-pay

## 2019-10-10 ENCOUNTER — Other Ambulatory Visit: Payer: Self-pay

## 2019-10-10 ENCOUNTER — Ambulatory Visit (HOSPITAL_COMMUNITY): Payer: Self-pay | Admitting: Pharmacist

## 2019-10-10 ENCOUNTER — Ambulatory Visit (HOSPITAL_COMMUNITY)
Admission: RE | Admit: 2019-10-10 | Discharge: 2019-10-10 | Disposition: A | Discharge status: Home / Self Care | Payer: Medicare Other | Source: Ambulatory Visit | Attending: Cardiology | Admitting: Cardiology

## 2019-10-10 VITALS — BP 90/57 | HR 68 | Wt 164.4 lb

## 2019-10-10 DIAGNOSIS — Z79899 Other long term (current) drug therapy: Secondary | ICD-10-CM

## 2019-10-10 DIAGNOSIS — E785 Hyperlipidemia, unspecified: Secondary | ICD-10-CM | POA: Insufficient documentation

## 2019-10-10 DIAGNOSIS — Z7982 Long term (current) use of aspirin: Secondary | ICD-10-CM | POA: Diagnosis not present

## 2019-10-10 DIAGNOSIS — I472 Ventricular tachycardia, unspecified: Secondary | ICD-10-CM

## 2019-10-10 DIAGNOSIS — I255 Ischemic cardiomyopathy: Secondary | ICD-10-CM | POA: Diagnosis not present

## 2019-10-10 DIAGNOSIS — I5022 Chronic systolic (congestive) heart failure: Secondary | ICD-10-CM | POA: Insufficient documentation

## 2019-10-10 DIAGNOSIS — I252 Old myocardial infarction: Secondary | ICD-10-CM | POA: Diagnosis not present

## 2019-10-10 DIAGNOSIS — E7800 Pure hypercholesterolemia, unspecified: Secondary | ICD-10-CM | POA: Diagnosis not present

## 2019-10-10 DIAGNOSIS — Z79890 Hormone replacement therapy (postmenopausal): Secondary | ICD-10-CM | POA: Diagnosis not present

## 2019-10-10 DIAGNOSIS — Z95811 Presence of heart assist device: Secondary | ICD-10-CM | POA: Diagnosis not present

## 2019-10-10 DIAGNOSIS — I2510 Atherosclerotic heart disease of native coronary artery without angina pectoris: Secondary | ICD-10-CM | POA: Diagnosis not present

## 2019-10-10 DIAGNOSIS — E039 Hypothyroidism, unspecified: Secondary | ICD-10-CM | POA: Insufficient documentation

## 2019-10-10 DIAGNOSIS — I480 Paroxysmal atrial fibrillation: Secondary | ICD-10-CM | POA: Diagnosis not present

## 2019-10-10 DIAGNOSIS — N183 Chronic kidney disease, stage 3 unspecified: Secondary | ICD-10-CM | POA: Insufficient documentation

## 2019-10-10 DIAGNOSIS — I4892 Unspecified atrial flutter: Secondary | ICD-10-CM | POA: Diagnosis not present

## 2019-10-10 DIAGNOSIS — I130 Hypertensive heart and chronic kidney disease with heart failure and stage 1 through stage 4 chronic kidney disease, or unspecified chronic kidney disease: Secondary | ICD-10-CM | POA: Insufficient documentation

## 2019-10-10 DIAGNOSIS — Z7901 Long term (current) use of anticoagulants: Secondary | ICD-10-CM

## 2019-10-10 DIAGNOSIS — N179 Acute kidney failure, unspecified: Secondary | ICD-10-CM | POA: Insufficient documentation

## 2019-10-10 DIAGNOSIS — I517 Cardiomegaly: Secondary | ICD-10-CM | POA: Diagnosis not present

## 2019-10-10 DIAGNOSIS — Z95810 Presence of automatic (implantable) cardiac defibrillator: Secondary | ICD-10-CM | POA: Diagnosis not present

## 2019-10-10 LAB — COMPREHENSIVE METABOLIC PANEL
ALT: 110 U/L — ABNORMAL HIGH (ref 0–44)
AST: 65 U/L — ABNORMAL HIGH (ref 15–41)
Albumin: 4 g/dL (ref 3.5–5.0)
Alkaline Phosphatase: 72 U/L (ref 38–126)
Anion gap: 12 (ref 5–15)
BUN: 33 mg/dL — ABNORMAL HIGH (ref 8–23)
CO2: 24 mmol/L (ref 22–32)
Calcium: 9 mg/dL (ref 8.9–10.3)
Chloride: 99 mmol/L (ref 98–111)
Creatinine, Ser: 2.18 mg/dL — ABNORMAL HIGH (ref 0.61–1.24)
GFR calc Af Amer: 33 mL/min — ABNORMAL LOW (ref >60)
GFR calc non Af Amer: 28 mL/min — ABNORMAL LOW (ref >60)
Glucose, Bld: 123 mg/dL — ABNORMAL HIGH (ref 70–99)
Potassium: 4 mmol/L (ref 3.5–5.1)
Sodium: 135 mmol/L (ref 135–145)
Total Bilirubin: 0.9 mg/dL (ref 0.3–1.2)
Total Protein: 7.4 g/dL (ref 6.5–8.1)

## 2019-10-10 LAB — CBC
HCT: 45.1 % (ref 39.0–52.0)
Hemoglobin: 14.1 g/dL (ref 13.0–17.0)
MCH: 29.6 pg (ref 26.0–34.0)
MCHC: 31.3 g/dL (ref 30.0–36.0)
MCV: 94.7 fL (ref 80.0–100.0)
Platelets: 209 10*3/uL (ref 150–400)
RBC: 4.76 MIL/uL (ref 4.22–5.81)
RDW: 16.7 % — ABNORMAL HIGH (ref 11.5–15.5)
WBC: 7 10*3/uL (ref 4.0–10.5)
nRBC: 0 % (ref 0.0–0.2)

## 2019-10-10 LAB — PROTIME-INR
INR: 2.3 — ABNORMAL HIGH (ref 0.8–1.2)
Prothrombin Time: 25 seconds — ABNORMAL HIGH (ref 11.4–15.2)

## 2019-10-10 LAB — LACTATE DEHYDROGENASE: LDH: 267 U/L — ABNORMAL HIGH (ref 98–192)

## 2019-10-10 LAB — PREALBUMIN: Prealbumin: 27.5 mg/dL (ref 18–38)

## 2019-10-10 LAB — TSH: TSH: 2.098 u[IU]/mL (ref 0.350–4.500)

## 2019-10-10 NOTE — Patient Instructions (Signed)
1. Decrease Amiodarone to 200 mg daily 2. Coumadin dosing per Ander Purpura PharmD 3. Return to [place] clinic in 2 months for full visit

## 2019-10-10 NOTE — Progress Notes (Addendum)
VAD CLINIC NOTE   HPI:  Johnny Oneill is a 79 y.o. 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   Admitted 3/4-3/25/15 with SOB and syncopal episode. Found to have SBO and NG placed and started on TPN. On 11/06/13 developed progressive SOB and hypotension and co-ox 51% and Hgb 6.7. Started on milrinone and transfused. Echo showed nl RV function. Patient had PEA arrest and was intubated and started on pressors. CXR revealed a R hemothorax and a R CT placed. He required VATs and evacuation of hemothorax and was then teansferred to CIR.   LVAD Issues  Elevated LDH--bivalirudin. D/C LDH 188  Presented to ER on 06/27/19 with syncopal episode and head injury from fall. ECG showed VT 240. Underwent emergent DC-CV in ER. Head CT with no bleed. Was admitted and had IC gen replacement (EOL). Had small hematoma at site. Amio started. Post-discharge found to have episode of AFL on ICD remote check but has since resolved.    He presents today for regular follow up. Feeling good. Remains active. Wife says he is not drinking enough. Denies orthopnea or PND. No fevers, chills or problems with driveline. No bleeding, melena or neuro symptoms. No VAD alarms. Taking all meds as prescribed. Saw Dr. Curt Bears last week. No further VT  VAD Indication: Destination therapy- patient choice    VAD interrogation & Equipment Management: Speed: 9200 Flow:  4.6 Power: 5.3w PI: 6.6  Alarms: none Events: 5- 10 PI events daily  Fixed speed 9200  Low speed limit: 8600  Primary Controller: Replace back up battery in 11 months Back up controller: Replace back up battery in 9 months  Annual Equipment Maintenance on UBC/PM was performed on 05/2019  Past Medical History:  Diagnosis Date  . Anxiety   . Automatic implantable cardioverter-defibrillator in situ   . CAD (coronary artery disease)    S/P stenting of LCX in  2004;  s/p Lat MI 2009 with occlusion of the LCX - treated with Promus stenting. Has total occlusion of the RCA.   [name] CHF (congestive heart failure) ([place])   . Hypercholesteremia   . Hypertension   . Hypothyroidism   . ICD (implantable cardiac defibrillator) in place    PROPHYLACTIC      medtronic  . Inferior MI ([place]) "? date"; 2009  . Ischemic cardiomyopathy    a. 10/09 Echo: Sev LV Dysfxn, inf/lat AK, Mod MR.  [name] LVAD (left ventricular assist device) present ([place])   . Osteoarthritis    a. s/p R TKR  . Pacemaker   . PVC (premature ventricular contraction)   . RBBB   . Shortness of breath     Current Outpatient Medications  Medication Sig Dispense Refill  . acetaminophen (TYLENOL) 500 MG chewable tablet Chew 500 mg by mouth every 6 (six) hours as needed for pain.    [name] amiodarone (PACERONE) 200 MG tablet Take 1 tablet (200 mg total) by mouth 2 (two) times daily. 60 tablet 6  . aspirin EC 81 MG tablet Take 81 mg by mouth daily.    . cholecalciferol (VITAMIN D3) 25 MCG (1000 UT) tablet Take 1,000 Units by mouth daily.    . citalopram (CELEXA) 10 MG tablet Take 1 tablet by mouth once daily 90 tablet 0  . Coenzyme Q10 (COQ10) 200 MG CAPS Take 200 mg by mouth daily.    . EUTHYROX 150 MCG tablet 1 tablet  before breakfast daily except half tablet on Mondays 90 tablet 1  . famotidine (PEPCID) 20 MG tablet Take 20 mg by mouth daily.     . hydrALAZINE (APRESOLINE) 50 MG tablet TAKE 1 TABLET BY MOUTH THREE TIMES DAILY 270 tablet 3  . Melatonin 3 MG TABS Take 1 tablet by mouth at bedtime.    . pantoprazole (PROTONIX) 40 MG tablet Take 1 tablet by mouth once daily 90 tablet 11  . Probiotic Product (PROBIOTIC PO) Take 1 capsule by mouth daily.    . rosuvastatin (CRESTOR) 20 MG tablet Take 1 tablet by mouth once daily 90 tablet 3  . spironolactone (ALDACTONE) 25 MG tablet Take 1 tablet by mouth once daily 90 tablet 0  . traZODone (DESYREL) 50 MG tablet Take 1 tablet (50 mg total) by mouth at bedtime.  (Patient taking differently: Take 50 mg by mouth at bedtime as needed. ) 30 tablet 5  . warfarin (COUMADIN) 5 MG tablet Take 2.5 mg(1/2 tab) every Monday/Friday and 5 mg (1 tab) all other days or as directed by HF clinic 60 tablet 11  . clindamycin (CLEOCIN) 150 MG capsule Take 4 capsules by mouth as needed. Prior to dental appointments    . furosemide (LASIX) 20 MG tablet Take 20 mg by mouth daily as needed (Take 20 mg as daily as needed for wt > 153 lbs). Reported on 01/14/2016    . potassium chloride SA (K-DUR,KLOR-CON) 20 MEQ tablet Take 1 tablet (20 mEq total) by mouth daily as needed. Take one potassium pill when you take your lasix. (Patient not taking: Reported on 10/10/2019) 90 tablet 3   No current facility-administered medications for this encounter.   Facility-Administered Medications Ordered in Other Encounters  Medication Dose Route Frequency Provider Last Rate Last Admin  . etomidate (AMIDATE) injection    Anesthesia Intra-op Myna Bright, CRNA   20 mg at 02/08/14 0915  . rocuronium [facility]) injection    Anesthesia Intra-op Myna Bright, CRNA   50 mg at 02/08/14 0932  . succinylcholine (ANECTINE) injection    Anesthesia Intra-op Myna Bright, CRNA   100 mg at 02/08/14 0915   Ace inhibitors, Diovan [valsartan], Lipitor [atorvastatin calcium], and Norvasc [amlodipine]  Review of systems complete and found to be negative unless listed in HPI.    Vitals:   10/10/19 0927 10/10/19 0928  BP: (!) 92/0 (!) 90/57  Pulse: 68   SpO2: 98%   Weight: 74.6 kg (164 lb 6.4 oz)        Vital Signs:  Temp: 98.6 Doppler Pressure: 80 Automatc BP:  93/75 (82) HR: 72 SPO2: 97% on RA  Weight: 164.4 lb w/o eqt Last weight: 168 lb  Physical Exam: General:  NAD.  HEENT: normal  Neck: supple. JVP not elevated.  Carotids 2+ bilat; no bruits. No lymphadenopathy or thryomegaly appreciated. Cor: LVAD hum. Hematoma over ICD site Lungs: Clear. Abdomen:soft, nontender,  non-distended. No hepatosplenomegaly. No bruits or masses. Good bowel sounds. Driveline site clean. Anchor in place.  Extremities: no cyanosis, clubbing, rash. Warm no edema  Neuro: alert & oriented x 3. No focal deficits. Moves all 4 without problem     ASSESSMENT AND PLAN:    1) Chronic systolic HF: ICM, EF 0000000 s/p LVAD HM II implant 08/2013 for DT.   - Echo 3/18 EF 10-15% mild AI. Mod RV dysfunction. LVAD cannula OK - Stable NYHA I - Volume status looks good to a little dry.. Does not require lasix. Encourage mor  po intake.  - Continue spiro 25 mg daily.  - He has not tolerated ACE-I or amlodipine in the past.  3) LVAD: HMII 08/2013 for DT. - VAD interrogated personally. Parameters stable. - Admitted in May 2018 with elevated LDH with bival.  - LDH 267.  - Continue aspirin and coumadin.  - Hgb 14.1 - Drivelines ite looks good - MAPs ok  2) VT - stable. No further events. Suspect possible suction event as trigger - now s/p ICD gen change - Drop po amio to 200 daily - Keep K> 4.0 Mg > 2.0- No driving x 6 months - Follows with Dr. Curt Bears   4) Chronic anticoagulation: INR goal 2.0-3.0.    - INR 2.3 Discussed dosing with PharmD personally. - Continue ASA 81 mg for LVAD + warfarin.   5) HTN:  - Blood pressure well controlled. Continue current regimen.  6. PAFL - Remains in NSR - continue amio/coumadin   6) Hypothyroidism:  - Followed by Dr Dwyane Dee. Stable.   7) Hyperlipidemia:  - Continue Crestor. Zetia stopped due to cost.  - No change.   8) CAD - No s/s ischemia  9) AKI on CKD III - creatinine up 1.85-> 2.18  today (baseline 1.5-1.9) - will follow. Encouraged him to stay hydrated   Total time spent 35 minutes. Over half that time spent discussing above.     Glori Bickers, MD  3:43 PM

## 2019-10-10 NOTE — Progress Notes (Addendum)
Patient presents for 2 month follow up in [facility] today. Reports no problems with VAD equipment or concerns with drive line.   Patient states he has been feeling great other than right knee pain. He received a steroid injection in his knee last week. Reports at times when he bends over and then stands back up he becomes lightheaded. Denies dizziness or falls. BP and VAD interrogation stable today. Per Dr Haroldine Laws increase fluid intake.   Had appt with Dr Curt Bears last week. Denies any further shocks from his device. Hematoma no longer present.   Amiodarone decreased to 200 mg daily. TSH/T4 and chest xray pending today.   Vital Signs:  Temp: 98.6 Doppler Pressure: 80 Automatc BP:  93/75 (82) HR: 72 SPO2: 97% on RA  Weight: 164.4 lb w/o eqt Last weight: 168 lb  VAD Indication: Destination therapy- patient choice    VAD interrogation & Equipment Management: Speed: 9200 Flow:  4.6 Power: 5.3w    PI: 6.6  Alarms: none Events: 5- 10 PI events daily  Fixed speed 9200  Low speed limit: 8600  Primary Controller:  Replace back up battery in 11 months Back up controller:  Replace back up battery in 9 months  Annual Equipment Maintenance on UBC/PM was performed on 05/2019  I reviewed the LVAD parameters from today and compared the results to the patient's prior recorded data. LVAD interrogation was NEGATIVE for significant power changes, NEGATIVE for clinical alarms and STABLE for PI events/speed drops. No programming changes were made and pump is functioning within specified parameters. Pt is performing daily controller and system monitor self tests along with completing weekly and monthly maintenance for LVAD equipment.  Exit Site Care: Drive line is being maintained weekly by [name], his wife. Dressing dry and intact. Stabilization device present and accurately applied. Pt denies fever or chills. Pt has CHG allergy. Pt provided with 4 weekly dressing kits and 5  anchors  Significant Events on VAD Support:  10/2013>SBO 12/2016> elevated LDH, admit for Bival  Device:Dual Medtronic Therapies:  V therapy on at 222 bpm; V monitor on 171         A therapy on 171 bpm (ramp and burst overdrive paing); A monitor on 300 bpm  Pacing: AAI<=>DDD 60 bpm  Last check: 07/11/19  BP & Labs:  Doppler 92 - Doppler is reflecting modified systolic  Hgb [id number] - No S/S of bleeding. Specifically denies melena/BRBPR or nosebleeds.  LDH 267 and is within established baseline of 200- 250. Denies tea-colored urine. No power elevations noted on interrogation.   Batteries Manufacture Date: Number of uses: Re-calibration  07/07/18 8,9 Performed by patient   Per patient he recently rotated in new batteries. Will plan to bring old batteries to clinic for disposal.   Annual maintenance completed per [facility] on patient's home power module and universal Char[employer] fundraiser.   Backup system controller 11 volt battery charged during visit.  6 year Intermacs follow up completed including:  Quality of Life, KCCQ-12, and Neurocognitive trail making.   Pt completed 1100 feet during 6 minute walk. Pt experienced right knee pain while walking.   Patient Instructions:  1. Decrease Amiodarone to 200 mg daily 2. Coumadin dosing per Ander Purpura PharmD. Return in 3 weeks for INR check.  3. Return to [facility] clinic in 2 months for full visit  Emerson Monte RN [place] Coordinator  Office: [phone]  24/7 Pager: [phone]

## 2019-10-10 NOTE — Progress Notes (Signed)
LVAD INR 

## 2019-10-11 LAB — T4: T4, Total: 14.8 ug/dL — ABNORMAL HIGH (ref 4.5–12.0)

## 2019-10-17 ENCOUNTER — Other Ambulatory Visit (HOSPITAL_COMMUNITY): Payer: Self-pay | Admitting: Internal Medicine

## 2019-10-17 DIAGNOSIS — I10 Essential (primary) hypertension: Secondary | ICD-10-CM

## 2019-10-19 ENCOUNTER — Telehealth (HOSPITAL_COMMUNITY): Payer: Self-pay | Admitting: Licensed Clinical Social Worker

## 2019-10-19 NOTE — Telephone Encounter (Signed)
CSW contacted patient/caregiver to follow up and remind of LVAD Support Group Zoom call on Monday October 23, 2019 @ 2:30pm. CSW offered support and reminders for covid safety. Patient/Caregiver grateful for the call and report all going well. CSW continues to follow though VAD Clinic for needs as identified. Jackie Raynald Rouillard, LCSW, CCSW-MCS [phone] 

## 2019-10-26 ENCOUNTER — Ambulatory Visit (HOSPITAL_COMMUNITY)
Admission: RE | Admit: 2019-10-26 | Discharge: 2019-10-26 | Disposition: A | Discharge status: Home / Self Care | Payer: Medicare Other | Source: Ambulatory Visit | Attending: Internal Medicine | Admitting: Internal Medicine

## 2019-10-26 ENCOUNTER — Ambulatory Visit (HOSPITAL_COMMUNITY): Payer: Self-pay | Admitting: Pharmacist

## 2019-10-26 ENCOUNTER — Other Ambulatory Visit: Payer: Self-pay

## 2019-10-26 ENCOUNTER — Other Ambulatory Visit (HOSPITAL_COMMUNITY): Payer: Self-pay | Admitting: *Deleted

## 2019-10-26 DIAGNOSIS — R2689 Other abnormalities of gait and mobility: Secondary | ICD-10-CM | POA: Insufficient documentation

## 2019-10-26 DIAGNOSIS — I252 Old myocardial infarction: Secondary | ICD-10-CM | POA: Insufficient documentation

## 2019-10-26 DIAGNOSIS — E785 Hyperlipidemia, unspecified: Secondary | ICD-10-CM | POA: Insufficient documentation

## 2019-10-26 DIAGNOSIS — I480 Paroxysmal atrial fibrillation: Secondary | ICD-10-CM

## 2019-10-26 DIAGNOSIS — I10 Essential (primary) hypertension: Secondary | ICD-10-CM

## 2019-10-26 DIAGNOSIS — Z7901 Long term (current) use of anticoagulants: Secondary | ICD-10-CM

## 2019-10-26 DIAGNOSIS — N179 Acute kidney failure, unspecified: Secondary | ICD-10-CM | POA: Insufficient documentation

## 2019-10-26 DIAGNOSIS — N183 Chronic kidney disease, stage 3 unspecified: Secondary | ICD-10-CM | POA: Diagnosis not present

## 2019-10-26 DIAGNOSIS — I2510 Atherosclerotic heart disease of native coronary artery without angina pectoris: Secondary | ICD-10-CM | POA: Diagnosis not present

## 2019-10-26 DIAGNOSIS — I472 Ventricular tachycardia, unspecified: Secondary | ICD-10-CM

## 2019-10-26 DIAGNOSIS — R251 Tremor, unspecified: Secondary | ICD-10-CM | POA: Diagnosis not present

## 2019-10-26 DIAGNOSIS — Z95810 Presence of automatic (implantable) cardiac defibrillator: Secondary | ICD-10-CM | POA: Diagnosis not present

## 2019-10-26 DIAGNOSIS — F419 Anxiety disorder, unspecified: Secondary | ICD-10-CM | POA: Insufficient documentation

## 2019-10-26 DIAGNOSIS — Z7982 Long term (current) use of aspirin: Secondary | ICD-10-CM | POA: Diagnosis not present

## 2019-10-26 DIAGNOSIS — E7800 Pure hypercholesterolemia, unspecified: Secondary | ICD-10-CM | POA: Diagnosis not present

## 2019-10-26 DIAGNOSIS — M1990 Unspecified osteoarthritis, unspecified site: Secondary | ICD-10-CM | POA: Insufficient documentation

## 2019-10-26 DIAGNOSIS — E039 Hypothyroidism, unspecified: Secondary | ICD-10-CM | POA: Insufficient documentation

## 2019-10-26 DIAGNOSIS — Z79899 Other long term (current) drug therapy: Secondary | ICD-10-CM | POA: Diagnosis not present

## 2019-10-26 DIAGNOSIS — I130 Hypertensive heart and chronic kidney disease with heart failure and stage 1 through stage 4 chronic kidney disease, or unspecified chronic kidney disease: Secondary | ICD-10-CM | POA: Insufficient documentation

## 2019-10-26 DIAGNOSIS — Z95811 Presence of heart assist device: Secondary | ICD-10-CM

## 2019-10-26 DIAGNOSIS — I5022 Chronic systolic (congestive) heart failure: Secondary | ICD-10-CM

## 2019-10-26 DIAGNOSIS — I255 Ischemic cardiomyopathy: Secondary | ICD-10-CM | POA: Diagnosis not present

## 2019-10-26 LAB — COMPREHENSIVE METABOLIC PANEL
ALT: 142 U/L — ABNORMAL HIGH (ref 0–44)
AST: 88 U/L — ABNORMAL HIGH (ref 15–41)
Albumin: 3.6 g/dL (ref 3.5–5.0)
Alkaline Phosphatase: 82 U/L (ref 38–126)
Anion gap: 10 (ref 5–15)
BUN: 25 mg/dL — ABNORMAL HIGH (ref 8–23)
CO2: 24 mmol/L (ref 22–32)
Calcium: 8.8 mg/dL — ABNORMAL LOW (ref 8.9–10.3)
Chloride: 101 mmol/L (ref 98–111)
Creatinine, Ser: 2.15 mg/dL — ABNORMAL HIGH (ref 0.61–1.24)
GFR calc Af Amer: 33 mL/min — ABNORMAL LOW (ref >60)
GFR calc non Af Amer: 29 mL/min — ABNORMAL LOW (ref >60)
Glucose, Bld: 93 mg/dL (ref 70–99)
Potassium: 4 mmol/L (ref 3.5–5.1)
Sodium: 135 mmol/L (ref 135–145)
Total Bilirubin: 0.9 mg/dL (ref 0.3–1.2)
Total Protein: 6.3 g/dL — ABNORMAL LOW (ref 6.5–8.1)

## 2019-10-26 LAB — LACTATE DEHYDROGENASE: LDH: 315 U/L — ABNORMAL HIGH (ref 98–192)

## 2019-10-26 LAB — CBC
HCT: 40.5 % (ref 39.0–52.0)
Hemoglobin: 13 g/dL (ref 13.0–17.0)
MCH: 30.1 pg (ref 26.0–34.0)
MCHC: 32.1 g/dL (ref 30.0–36.0)
MCV: 93.8 fL (ref 80.0–100.0)
Platelets: 117 10*3/uL — ABNORMAL LOW (ref 150–400)
RBC: 4.32 MIL/uL (ref 4.22–5.81)
RDW: 16.4 % — ABNORMAL HIGH (ref 11.5–15.5)
WBC: 4.9 10*3/uL (ref 4.0–10.5)
nRBC: 0 % (ref 0.0–0.2)

## 2019-10-26 LAB — PROTIME-INR
INR: 3.6 — ABNORMAL HIGH (ref 0.8–1.2)
Prothrombin Time: 36 seconds — ABNORMAL HIGH (ref 11.4–15.2)

## 2019-10-26 MED ORDER — AMIODARONE HCL 200 MG PO TABS: 200.0000 mg | ORAL_TABLET | Freq: Every day | ORAL | 6 refills | Status: DC

## 2019-10-26 NOTE — Progress Notes (Signed)
Patient presents for sick visit in [facility] today. Reports no problems with VAD equipment or concerns with drive line.   Patient states he has been having hand tremors and very off balanced while walking for the last 3 weeks. Pt currently has a rash on his head and face. Pt was supposed to decrease his Amiodarone at his February 9th visit, but did not.  Vital Signs:  Doppler Pressure: 84 Automatc BP:  103/81(95) HR: 60 SPO2: 97% on RA  Weight: 164.4 lb w/o eqt Last weight: 168 lb  VAD Indication: Destination therapy- patient choice    VAD interrogation & Equipment Management: Speed: 9200 Flow:  4.6 Power: 5.3w    PI: 6.6  Alarms: none Events: 5- 10 PI events daily  Fixed speed 9200  Low speed limit: 8600  Primary Controller:  Replace back up battery in 11 months Back up controller:  Replace back up battery in 9 months  Annual Equipment Maintenance on UBC/PM was performed on 05/2019  I reviewed the LVAD parameters from today and compared the results to the patient's prior recorded data. LVAD interrogation was NEGATIVE for significant power changes, NEGATIVE for clinical alarms and STABLE for PI events/speed drops. No programming changes were made and pump is functioning within specified parameters. Pt is performing daily controller and system monitor self tests along with completing weekly and monthly maintenance for LVAD equipment.  Exit Site Care: Drive line is being maintained weekly by [name], his wife. Dressing dry and intact. Stabilization device present and accurately applied. Pt denies fever or chills. Pt has CHG allergy. Pt has sufficient dressing kits at home.   Significant Events on VAD Support:  10/2013>SBO 12/2016> elevated LDH, admit for Bival  Device:Dual Medtronic Therapies:  V therapy on at 222 bpm; V monitor on 171         A therapy on 171 bpm (ramp and burst overdrive paing); A monitor on 300 bpm  Pacing: AAI<=>DDD 60 bpm  Last check:  07/11/19  BP & Labs:  Doppler 84 - Doppler is reflecting modified systolic  Hgb 13 - No S/S of bleeding. Specifically denies melena/BRBPR or nosebleeds.  LDH 315 and is within established baseline of 200- 250. Denies tea-colored urine. No power elevations noted on interrogation.    Patient Instructions:  1. Decrease Amiodarone to 200 mg daily 2. Coumadin dosing per Ander Purpura PharmD. .  3. Liver ultrasound tomorrow morning at 0700 nothing to eat or drink after midnight. 4. Return for labs-CMET, INR, LDH next week.    Tanda Rockers RN [place] Coordinator  Office: [phone]  24/7 Pager: [phone]

## 2019-10-26 NOTE — Progress Notes (Signed)
LVAD INR 

## 2019-10-26 NOTE — Patient Instructions (Addendum)
1. Decrease Amiodarone to 200 mg daily 2. We will call you with lab results

## 2019-10-27 ENCOUNTER — Ambulatory Visit (HOSPITAL_COMMUNITY): Payer: Medicare Other

## 2019-10-27 ENCOUNTER — Other Ambulatory Visit (HOSPITAL_COMMUNITY): Payer: Self-pay | Admitting: *Deleted

## 2019-10-27 ENCOUNTER — Encounter (HOSPITAL_COMMUNITY): Payer: Medicare Other

## 2019-10-27 DIAGNOSIS — Z95811 Presence of heart assist device: Secondary | ICD-10-CM

## 2019-10-27 DIAGNOSIS — Z7901 Long term (current) use of anticoagulants: Secondary | ICD-10-CM

## 2019-10-29 NOTE — Progress Notes (Signed)
VAD CLINIC NOTE   HPI:  Johnny Oneill is a 79 y.o. 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   Admitted 3/4-3/25/15 with SOB and syncopal episode. Found to have SBO and NG placed and started on TPN. On 11/06/13 developed progressive SOB and hypotension and co-ox 51% and Hgb 6.7. Started on milrinone and transfused. Echo showed nl RV function. Patient had PEA arrest and was intubated and started on pressors. CXR revealed a R hemothorax and a R CT placed. He required VATs and evacuation of hemothorax and was then teansferred to CIR.   LVAD Issues  Elevated LDH--bivalirudin. D/C LDH 188  Presented to ER on 06/27/19 with syncopal episode and head injury from fall. ECG showed VT 240. Underwent emergent DC-CV in ER. Head CT with no bleed. Was admitted and had IC gen replacement (EOL). Had small hematoma at site. Amio started. Post-discharge found to have episode of AFL on ICD remote check but has since resolved.    Patient presents for sick visit in [facility] today. Reports no problems with VAD equipment or concerns with drive line. Over past few weeks has noticed progressive tremors and imbalance. No other focal neuro symptoms. ICD has not fired. Denies orthopnea or PND. No fevers, chills or problems with driveline. No bleeding, melena or neuro symptoms. No VAD alarms. Taking all meds as prescribed.   VAD Indication: Destination therapy- patient choice    VAD interrogation & Equipment Management: Speed: 9200 Flow:  4.6 Power: 5.3w PI: 6.6  Alarms: none Events: 5- 10 PI events daily  Fixed speed 9200  Low speed limit: 8600  Primary Controller: Replace back up battery in 11 months Back up controller: Replace back up battery in 9 months  Annual Equipment Maintenance on UBC/PM was performed on 05/2019  Exit Site Care: Drive line is being maintained weekly by [name], his wife. Dressing dry  and intact. Stabilization device present and accurately applied. Pt denies fever or chills. Pt has CHG allergy. Pt has sufficient dressing kits at home.     Past Medical History:  Diagnosis Date  . Anxiety   . Automatic implantable cardioverter-defibrillator in situ   . CAD (coronary artery disease)    S/P stenting of LCX in 2004;  s/p Lat MI 2009 with occlusion of the LCX - treated with Promus stenting. Has total occlusion of the RCA.   [name] CHF (congestive heart failure) ([place])   . Hypercholesteremia   . Hypertension   . Hypothyroidism   . ICD (implantable cardiac defibrillator) in place    PROPHYLACTIC      medtronic  . Inferior MI ([place]) "? date"; 2009  . Ischemic cardiomyopathy    a. 10/09 Echo: Sev LV Dysfxn, inf/lat AK, Mod MR.  [name] LVAD (left ventricular assist device) present ([place])   . Osteoarthritis    a. s/p R TKR  . Pacemaker   . PVC (premature ventricular contraction)   . RBBB   . Shortness of breath     Current Outpatient Medications  Medication Sig Dispense Refill  . acetaminophen (TYLENOL) 500 MG chewable tablet Chew 500 mg by mouth every 6 (six) hours as needed for pain.    [name] amiodarone (PACERONE) 200 MG tablet Take 1 tablet (200 mg total) by mouth daily. 60 tablet 6  . aspirin EC 81 MG tablet Take 81 mg by mouth daily.    . cholecalciferol (VITAMIN D3)  25 MCG (1000 UT) tablet Take 1,000 Units by mouth daily.    . citalopram (CELEXA) 10 MG tablet Take 1 tablet by mouth once daily 90 tablet 0  . Coenzyme Q10 (COQ10) 200 MG CAPS Take 200 mg by mouth daily.    . EUTHYROX 150 MCG tablet 1 tablet before breakfast daily except half tablet on Mondays 90 tablet 1  . famotidine (PEPCID) 20 MG tablet Take 20 mg by mouth daily.     . furosemide (LASIX) 20 MG tablet Take 20 mg by mouth daily as needed (Take 20 mg as daily as needed for wt > 153 lbs). Reported on 01/14/2016    . hydrALAZINE (APRESOLINE) 50 MG tablet TAKE 1 TABLET BY MOUTH THREE TIMES DAILY 270 tablet 3  .  Melatonin 3 MG TABS Take 1 tablet by mouth at bedtime.    . pantoprazole (PROTONIX) 40 MG tablet Take 1 tablet by mouth once daily 90 tablet 11  . Probiotic Product (PROBIOTIC PO) Take 1 capsule by mouth daily.    . rosuvastatin (CRESTOR) 20 MG tablet Take 1 tablet by mouth once daily 90 tablet 3  . spironolactone (ALDACTONE) 25 MG tablet Take 1 tablet by mouth once daily 90 tablet 3  . traZODone (DESYREL) 50 MG tablet Take 1 tablet (50 mg total) by mouth at bedtime. (Patient taking differently: Take 50 mg by mouth at bedtime as needed. ) 30 tablet 5  . warfarin (COUMADIN) 5 MG tablet Take 2.5 mg(1/2 tab) every Monday/Friday and 5 mg (1 tab) all other days or as directed by HF clinic 60 tablet 11  . clindamycin (CLEOCIN) 150 MG capsule Take 4 capsules by mouth as needed. Prior to dental appointments    . potassium chloride SA (K-DUR,KLOR-CON) 20 MEQ tablet Take 1 tablet (20 mEq total) by mouth daily as needed. Take one potassium pill when you take your lasix. (Patient not taking: Reported on 10/10/2019) 90 tablet 3   No current facility-administered medications for this encounter.   Facility-Administered Medications Ordered in Other Encounters  Medication Dose Route Frequency Provider Last Rate Last Admin  . etomidate (AMIDATE) injection    Anesthesia Intra-op Myna Bright, CRNA   20 mg at 02/08/14 0915  . rocuronium [facility]) injection    Anesthesia Intra-op Myna Bright, CRNA   50 mg at 02/08/14 0932  . succinylcholine (ANECTINE) injection    Anesthesia Intra-op Myna Bright, CRNA   100 mg at 02/08/14 0915   Ace inhibitors, Diovan [valsartan], Lipitor [atorvastatin calcium], and Norvasc [amlodipine]  Review of systems complete and found to be negative unless listed in HPI.    Vital Signs:  Doppler Pressure: 84 Automatc BP:  103/81(95) HR: 60 SPO2: 97% on RA  Weight: 164.4 lb w/o eqt Last weight: 168 lb   Physical Exam: General:  NAD.  HEENT: normal  Neck:  supple. JVP not elevated.  Carotids 2+ bilat; no bruits. No lymphadenopathy or thryomegaly appreciated. Cor: LVAD hum. ICD site well healed Lungs: Clear. Abdomen: obese soft, nontender, non-distended. No hepatosplenomegaly. No bruits or masses. Good bowel sounds. Driveline site clean. Anchor in place.  Extremities: no cyanosis, clubbing, rash. Warm no edema  Neuro: alert & oriented x 3. No focal deficits. Moves all 4 without problem    ASSESSMENT AND PLAN:   1) Tremors & imbalance - likely due to cerebellar side effects from amio - cut amio to 200 daily  2) Chronic systolic HF: ICM, EF 0000000 s/p LVAD HM II implant 08/2013  for DT.   - Echo 3/18 EF 10-15% mild AI. Mod RV dysfunction. LVAD cannula OK - Stable NYHA I no change - Volume status ok. Off lasix - Continue spiro 25 mg daily.  - He has not tolerated ACE-I or amlodipine in the past.  3) LVAD: HMII 08/2013 for DT. - VAD interrogated personally. Parameters stable. - Admitted in May 2018 with elevated LDH with bival.  - LDH 267 -> 315  - Continue aspirin and coumadin.  - Hgb 13.0 - Driveline site looks ok - MAPs ok  4) VT - stable. No further events. Suspect possible suction event as trigger - now s/p ICD gen change. Site improved - Drop po amio to 200 daily due to cerebellar side effects - Keep K> 4.0 Mg > 2.0- No driving x 6 months - Follows with Dr. Curt Bears   5) Chronic anticoagulation: INR goal 2.0-3.0.    - INR 3.6 Discussed dosing with PharmD personally. - Continue ASA 81 mg for LVAD + warfarin.   6) HTN:  - Blood pressure well controlled. Continue current regimen.  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 Dropping amio today   8) Hypothyroidism:  - Followed by Dr Dwyane Dee. Stable.   9) Hyperlipidemia:  - Continue Crestor. Zetia stopped due to cost.  - No change.   8) CAD - No s/s ischemia  9) AKI on CKD III - creatinine up 1.85-> 2.18. stable 2.15  today (baseline 1.5-1.9) - will follow.  Encouraged him to stay hydrated   Total time spent 35 minutes. Over half that time spent discussing above.    Glori Bickers, MD  5:57 PM

## 2019-10-30 ENCOUNTER — Other Ambulatory Visit: Payer: Self-pay

## 2019-10-30 ENCOUNTER — Ambulatory Visit (HOSPITAL_COMMUNITY)
Admission: RE | Admit: 2019-10-30 | Discharge: 2019-10-30 | Disposition: A | Discharge status: Home / Self Care | Payer: Medicare Other | Source: Ambulatory Visit | Attending: Internal Medicine | Admitting: Internal Medicine

## 2019-10-30 DIAGNOSIS — Z95811 Presence of heart assist device: Secondary | ICD-10-CM | POA: Diagnosis not present

## 2019-10-30 DIAGNOSIS — K8020 Calculus of gallbladder without cholecystitis without obstruction: Secondary | ICD-10-CM | POA: Diagnosis not present

## 2019-10-31 ENCOUNTER — Other Ambulatory Visit (HOSPITAL_COMMUNITY): Payer: Self-pay | Admitting: Internal Medicine

## 2019-10-31 ENCOUNTER — Other Ambulatory Visit: Payer: Self-pay | Admitting: Unknown Physician Specialty

## 2019-10-31 NOTE — Telephone Encounter (Signed)
error 

## 2019-11-01 ENCOUNTER — Other Ambulatory Visit: Payer: Self-pay

## 2019-11-01 ENCOUNTER — Ambulatory Visit (HOSPITAL_COMMUNITY): Payer: Self-pay | Admitting: Pharmacist

## 2019-11-01 ENCOUNTER — Ambulatory Visit (HOSPITAL_COMMUNITY)
Admission: RE | Admit: 2019-11-01 | Discharge: 2019-11-01 | Disposition: A | Discharge status: Home / Self Care | Payer: Medicare Other | Source: Ambulatory Visit | Attending: Cardiology | Admitting: Cardiology

## 2019-11-01 DIAGNOSIS — Z7901 Long term (current) use of anticoagulants: Secondary | ICD-10-CM | POA: Insufficient documentation

## 2019-11-01 DIAGNOSIS — Z95811 Presence of heart assist device: Secondary | ICD-10-CM | POA: Diagnosis not present

## 2019-11-01 LAB — COMPREHENSIVE METABOLIC PANEL
ALT: 225 U/L — ABNORMAL HIGH (ref 0–44)
AST: 107 U/L — ABNORMAL HIGH (ref 15–41)
Albumin: 3.6 g/dL (ref 3.5–5.0)
Alkaline Phosphatase: 98 U/L (ref 38–126)
Anion gap: 9 (ref 5–15)
BUN: 24 mg/dL — ABNORMAL HIGH (ref 8–23)
CO2: 26 mmol/L (ref 22–32)
Calcium: 9 mg/dL (ref 8.9–10.3)
Chloride: 102 mmol/L (ref 98–111)
Creatinine, Ser: 1.94 mg/dL — ABNORMAL HIGH (ref 0.61–1.24)
GFR calc Af Amer: 38 mL/min — ABNORMAL LOW (ref >60)
GFR calc non Af Amer: 33 mL/min — ABNORMAL LOW (ref >60)
Glucose, Bld: 102 mg/dL — ABNORMAL HIGH (ref 70–99)
Potassium: 4.1 mmol/L (ref 3.5–5.1)
Sodium: 137 mmol/L (ref 135–145)
Total Bilirubin: 0.6 mg/dL (ref 0.3–1.2)
Total Protein: 6.7 g/dL (ref 6.5–8.1)

## 2019-11-01 LAB — PROTIME-INR
INR: 2.9 — ABNORMAL HIGH (ref 0.8–1.2)
Prothrombin Time: 29.9 seconds — ABNORMAL HIGH (ref 11.4–15.2)

## 2019-11-01 LAB — CBC
HCT: 42.3 % (ref 39.0–52.0)
Hemoglobin: 13.3 g/dL (ref 13.0–17.0)
MCH: 29.8 pg (ref 26.0–34.0)
MCHC: 31.4 g/dL (ref 30.0–36.0)
MCV: 94.6 fL (ref 80.0–100.0)
Platelets: 163 10*3/uL (ref 150–400)
RBC: 4.47 MIL/uL (ref 4.22–5.81)
RDW: 16.4 % — ABNORMAL HIGH (ref 11.5–15.5)
WBC: 4.8 10*3/uL (ref 4.0–10.5)
nRBC: 0 % (ref 0.0–0.2)

## 2019-11-01 LAB — LACTATE DEHYDROGENASE: LDH: 253 U/L — ABNORMAL HIGH (ref 98–192)

## 2019-11-01 NOTE — Progress Notes (Signed)
LVAD INR 

## 2019-11-02 ENCOUNTER — Telehealth (HOSPITAL_COMMUNITY): Payer: Self-pay | Admitting: Unknown Physician Specialty

## 2019-11-02 ENCOUNTER — Encounter: Payer: Self-pay | Admitting: Physician Assistant

## 2019-11-02 DIAGNOSIS — Z79899 Other long term (current) drug therapy: Secondary | ICD-10-CM

## 2019-11-02 DIAGNOSIS — Z95811 Presence of heart assist device: Secondary | ICD-10-CM

## 2019-11-02 NOTE — Telephone Encounter (Signed)
Dr. Haroldine Laws requesting pt be seen by GI for increasing liver enzymes. Referral made in epic. Will call [facility] to ensure they received referral.  Tanda Rockers RN, BSN VAD Coordinator 24/7 Pager [phone]

## 2019-11-06 ENCOUNTER — Telehealth: Payer: Self-pay | Admitting: *Deleted

## 2019-11-06 NOTE — Telephone Encounter (Signed)
Spoke with patient regarding stopping Amiodarone per Dr Haroldine Laws. Plan to recheck LFTs with next INR draw. He verbalized understanding.   Emerson Monte RN [place] Coordinator  Office: [phone]  24/7 Pager: [phone]

## 2019-11-08 ENCOUNTER — Encounter (HOSPITAL_COMMUNITY): Payer: Self-pay | Admitting: Unknown Physician Specialty

## 2019-11-08 ENCOUNTER — Other Ambulatory Visit (HOSPITAL_COMMUNITY): Payer: Self-pay | Admitting: Unknown Physician Specialty

## 2019-11-09 ENCOUNTER — Encounter: Payer: Self-pay | Admitting: Physician Assistant

## 2019-11-09 ENCOUNTER — Other Ambulatory Visit: Payer: Self-pay

## 2019-11-09 ENCOUNTER — Other Ambulatory Visit (INDEPENDENT_AMBULATORY_CARE_PROVIDER_SITE_OTHER): Payer: Medicare Other

## 2019-11-09 ENCOUNTER — Other Ambulatory Visit (HOSPITAL_COMMUNITY): Payer: Self-pay | Admitting: *Deleted

## 2019-11-09 ENCOUNTER — Ambulatory Visit (INDEPENDENT_AMBULATORY_CARE_PROVIDER_SITE_OTHER): Payer: Medicare Other | Admitting: Physician Assistant

## 2019-11-09 VITALS — HR 68 | Temp 98.7°F | Ht 67.0 in | Wt 168.0 lb

## 2019-11-09 DIAGNOSIS — K8020 Calculus of gallbladder without cholecystitis without obstruction: Secondary | ICD-10-CM | POA: Diagnosis not present

## 2019-11-09 DIAGNOSIS — R7989 Other specified abnormal findings of blood chemistry: Secondary | ICD-10-CM

## 2019-11-09 DIAGNOSIS — Z95811 Presence of heart assist device: Secondary | ICD-10-CM

## 2019-11-09 DIAGNOSIS — I255 Ischemic cardiomyopathy: Secondary | ICD-10-CM

## 2019-11-09 DIAGNOSIS — Z7901 Long term (current) use of anticoagulants: Secondary | ICD-10-CM

## 2019-11-09 LAB — HEPATIC FUNCTION PANEL
ALT: 76 U/L — ABNORMAL HIGH (ref 0–53)
AST: 51 U/L — ABNORMAL HIGH (ref 0–37)
Albumin: 4 g/dL (ref 3.5–5.2)
Alkaline Phosphatase: 83 U/L (ref 39–117)
Bilirubin, Direct: 0.2 mg/dL (ref 0.0–0.3)
Total Bilirubin: 1 mg/dL (ref 0.2–1.2)
Total Protein: 7.2 g/dL (ref 6.0–8.3)

## 2019-11-09 NOTE — Progress Notes (Addendum)
Sent to Dr. Havery Moros in mistake. JLL

## 2019-11-09 NOTE — Progress Notes (Addendum)
Chief Complaint: Elevated LFTs  HPI:    Johnny Oneill is a 79 year old male with a past medical history as listed below including CAD, CHF, status post ICD, ischemic cardiomyopathy, LVAD on Coumadin (06/28/2019 echo with LVEF less than 20%) and others listed below, known to Dr. Fuller Plan, who was referred to me by Lavone Orn, MD for a complaint of elevated LFTs.      09/15/2013 EGD Dr. Fuller Plan for esophageal polyp noticed on CT.  EGD was normal.    10/30/2019 right upper quadrant ultrasound with elastography with cholelithiasis and gallstones measuring up to 1 cm in diameter with borderline gallbladder wall thickening at 0.3 cm, no focal liver lesion, within normal limits and parenchymal echogenicity, portal vein is patent.    11/01/2019 CMP with elevated AST at 107 (10/06/19-88, 10/10/19-65, 06/27/19-33) and ALT at 225 (10/26/19-142, 10/10/19-110, 06/27/19-22 ), liver enzymes otherwise normal.  No sign of cirrhosis.    Today, the patient presents to clinic accompanied by his wife, who does assist with history and explains that he has no abdominal pain or symptoms.  Describes that he had a fall back in October and was in the hospital and after that they changed out his pacemaker and started him on Amiodarone, his liver enzymes then started to creep up when they were checking them after that point.  They stopped his Amiodarone about a week ago now and told him that this could be the cause.    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  Apparently [employer] did a video on him during his last game showing that you could go on with life after an LVAD.    Denies fever, chills, abdominal pain, nausea, vomiting, right upper quadrant pain, jaundice, history of alcohol abuse, history of IV drug use or family history of liver disease.  Past Medical History:  Diagnosis Date  . Anxiety   . Automatic implantable cardioverter-defibrillator in situ   . CAD (coronary artery disease)    S/P stenting  of LCX in 2004;  s/p Lat MI 2009 with occlusion of the LCX - treated with Promus stenting. Has total occlusion of the RCA.   [name] CHF (congestive heart failure) ([place])   . Hypercholesteremia   . Hypertension   . Hypothyroidism   . ICD (implantable cardiac defibrillator) in place    PROPHYLACTIC      medtronic  . Inferior MI ([place]) "? date"; 2009  . Ischemic cardiomyopathy    a. 10/09 Echo: Sev LV Dysfxn, inf/lat AK, Mod MR.  [name] LVAD (left ventricular assist device) present ([place])   . Osteoarthritis    a. s/p R TKR  . Pacemaker   . PVC (premature ventricular contraction)   . RBBB   . Shortness of breath     Past Surgical History:  Procedure Laterality Date  . CARPAL TUNNEL RELEASE Right 05/29/2016   Procedure: CARPAL TUNNEL RELEASE;  Surgeon: Earlie Server, MD;  Location: [place];  Service: Orthopedics;  Laterality: Right;  . CORONARY ANGIOPLASTY WITH STENT PLACEMENT  2004  . CORONARY ANGIOPLASTY WITH STENT PLACEMENT  07/2008  . DEFIBRILLATOR  2009  . ESOPHAGOGASTRODUODENOSCOPY N/A 09/15/2013   Procedure: ESOPHAGOGASTRODUODENOSCOPY (EGD);  Surgeon: Ladene Artist, MD;  Location: [facility] ENDOSCOPY;  Service: Endoscopy;  Laterality: N/A;  bedside  . HEMATOMA EVACUATION Right 11/06/2013   Procedure: EVACUATION HEMATOMA;  Surgeon: Gaye Pollack, MD;  Location: [place];  Service: Cardiothoracic;  Laterality: Right;  . ICD GENERATOR CHANGEOUT N/A 06/28/2019  Procedure: ICD GENERATOR CHANGEOUT;  Surgeon: Constance Haw, MD;  Location: [place] CV LAB;  Service: Cardiovascular;  Laterality: N/A;  . INSERT / REPLACE / REMOVE PACEMAKER  2009  . INSERTION OF IMPLANTABLE LEFT VENTRICULAR ASSIST DEVICE N/A 09/18/2013   Procedure: INSERTION OF IMPLANTABLE LEFT VENTRICULAR ASSIST DEVICE;  Surgeon: Ivin Poot, MD;  Location: [place];  Service: Open Heart Surgery;  Laterality: N/A;  CIRC ARREST  NITRIC OXIDE  . INTRA-AORTIC BALLOON PUMP INSERTION N/A 09/14/2013   Procedure: INTRA-AORTIC BALLOON PUMP  INSERTION;  Surgeon: Jolaine Artist, MD;  Location: [facility] CATH LAB;  Service: Cardiovascular;  Laterality: N/A;  . INTRAOPERATIVE TRANSESOPHAGEAL ECHOCARDIOGRAM N/A 09/18/2013   Procedure: INTRAOPERATIVE TRANSESOPHAGEAL ECHOCARDIOGRAM;  Surgeon: Ivin Poot, MD;  Location: [place];  Service: Open Heart Surgery;  Laterality: N/A;  . KNEE ARTHROSCOPY     bilaterally  . KNEE ARTHROSCOPY W/ PARTIAL MEDIAL MENISCECTOMY  12/2002   left  . LEFT VENTRICULAR ASSIST DEVICE    . RIGHT HEART CATHETERIZATION N/A 08/25/2013   Procedure: RIGHT HEART CATH;  Surgeon: Jolaine Artist, MD;  Location: [facility] CATH LAB;  Service: Cardiovascular;  Laterality: N/A;  . RIGHT HEART CATHETERIZATION N/A 09/13/2013   Procedure: RIGHT HEART CATH;  Surgeon: Jolaine Artist, MD;  Location: [facility] CATH LAB;  Service: Cardiovascular;  Laterality: N/A;  . RIGHT HEART CATHETERIZATION N/A 02/08/2014   Procedure: RIGHT HEART CATH;  Surgeon: Jolaine Artist, MD;  Location: [facility] CATH LAB;  Service: Cardiovascular;  Laterality: N/A;  . RIGHT HEART CATHETERIZATION N/A 02/12/2014   Procedure: RIGHT HEART CATH;  Surgeon: Jolaine Artist, MD;  Location: [facility] CATH LAB;  Service: Cardiovascular;  Laterality: N/A;  . TOTAL KNEE ARTHROPLASTY  11/27/11   left  . TOTAL KNEE ARTHROPLASTY  11/27/2011   Procedure: TOTAL KNEE ARTHROPLASTY;  Surgeon: Yvette Rack., MD;  Location: [place];  Service: Orthopedics;  Laterality: Left;  left total knee arthroplasty  . VIDEO ASSISTED THORACOSCOPY Right 11/06/2013   Procedure: VIDEO ASSISTED THORACOSCOPY;  Surgeon: Gaye Pollack, MD;  Location: [facility] OR;  Service: Cardiothoracic;  Laterality: Right;    Current Outpatient Medications  Medication Sig Dispense Refill  . acetaminophen (TYLENOL) 500 MG chewable tablet Chew 500 mg by mouth every 6 (six) hours as needed for pain.    [name] aspirin EC 81 MG tablet Take 81 mg by mouth daily.    . cholecalciferol (VITAMIN D3) 25 MCG (1000 UT) tablet Take 1,000 Units by  mouth daily.    . citalopram (CELEXA) 10 MG tablet Take 1 tablet by mouth once daily 90 tablet 0  . clindamycin (CLEOCIN) 150 MG capsule Take 4 capsules by mouth as needed. Prior to dental appointments    . Coenzyme Q10 (COQ10) 200 MG CAPS Take 200 mg by mouth daily.    . EUTHYROX 150 MCG tablet 1 tablet before breakfast daily except half tablet on Mondays 90 tablet 1  . famotidine (PEPCID) 20 MG tablet Take 20 mg by mouth daily.     . furosemide (LASIX) 20 MG tablet Take 20 mg by mouth daily as needed (Take 20 mg as daily as needed for wt > 153 lbs). Reported on 01/14/2016    . hydrALAZINE (APRESOLINE) 50 MG tablet TAKE 1 TABLET BY MOUTH THREE TIMES DAILY 270 tablet 3  . Melatonin 3 MG TABS Take 1 tablet by mouth at bedtime.    . pantoprazole (PROTONIX) 40 MG tablet Take 1 tablet by mouth once daily  90 tablet 11  . potassium chloride SA (K-DUR,KLOR-CON) 20 MEQ tablet Take 1 tablet (20 mEq total) by mouth daily as needed. Take one potassium pill when you take your lasix. (Patient not taking: Reported on 10/10/2019) 90 tablet 3  . Probiotic Product (PROBIOTIC PO) Take 1 capsule by mouth daily.    . rosuvastatin (CRESTOR) 20 MG tablet Take 1 tablet by mouth once daily 90 tablet 3  . spironolactone (ALDACTONE) 25 MG tablet Take 1 tablet by mouth once daily 90 tablet 3  . traZODone (DESYREL) 50 MG tablet Take 1 tablet (50 mg total) by mouth at bedtime. 30 tablet 5  . warfarin (COUMADIN) 5 MG tablet Take 2.5 mg(1/2 tab) every Monday/Friday and 5 mg (1 tab) all other days or as directed by HF clinic 60 tablet 11   No current facility-administered medications for this visit.   Facility-Administered Medications Ordered in Other Visits  Medication Dose Route Frequency Provider Last Rate Last Admin  . etomidate (AMIDATE) injection    Anesthesia Intra-op Myna Bright, CRNA   20 mg at 02/08/14 0915  . rocuronium [facility]) injection    Anesthesia Intra-op Myna Bright, CRNA   50 mg at 02/08/14 0932   . succinylcholine (ANECTINE) injection    Anesthesia Intra-op Myna Bright, CRNA   100 mg at 02/08/14 0915    Allergies as of 11/09/2019 - Review Complete 10/03/2019  Allergen Reaction Noted  . Ace inhibitors  01/29/2014  . Diovan [valsartan] Other (See Comments) 06/03/2011  . Lipitor [atorvastatin calcium] Other (See Comments) 11/19/2011  . Norvasc [amlodipine] Other (See Comments) 02/06/2014    Family History  Problem Relation Age of Onset  . Heart failure Father   . Stroke Father   . Coronary artery disease Mother   . Heart disease Mother   . Hyperlipidemia Sister     Social History   Socioeconomic History  . Marital status: Married    Spouse name: Not on file  . Number of children: 1  . Years of education: Not on file  . Highest education level: Not on file  Occupational History  . Occupation: Music therapist: RETIRED  . Occupation: Copywriter, advertising  Tobacco Use  . Smoking status: Never Smoker  . Smokeless tobacco: Never Used  Substance and Sexual Activity  . Alcohol use: No  . Drug use: No    Types: Marijuana    Comment: "back in our younger days we smoked a little pot"  . Sexual activity: Not Currently  Other Topics Concern  . Not on file  Social History Narrative  . Not on file   Social Determinants of Health   Financial Resource Strain:   . Difficulty of Paying Living Expenses:   Food Insecurity:   . Worried About Charity fundraiser in the Last Year:   . Arboriculturist in the Last Year:   Transportation Needs:   . Film/video editor (Medical):   [name] Lack of Transportation (Non-Medical):   Physical Activity:   . Days of Exercise per Week:   . Minutes of Exercise per Session:   Stress:   . Feeling of Stress :   Social Connections:   . Frequency of Communication with Friends and Family:   . Frequency of Social Gatherings with Friends and Family:   . Attends Religious Services:   . Active Member of Clubs or Organizations:   .  Attends Archivist Meetings:   [name] Marital Status:  Intimate Partner Violence:   . Fear of Current or Ex-Partner:   . Emotionally Abused:   [name] Physically Abused:   . Sexually Abused:     Review of Systems:    Constitutional: No weight loss, fever or chills Skin: No rash  Cardiovascular: No chest pain  Respiratory: No SOB Gastrointestinal: See HPI and otherwise negative Genitourinary: No dysuria Neurological: No headache Musculoskeletal: No new muscle or joint pain Hematologic: No bleeding or bruising Psychiatric: No history of depression or anxiety   Physical Exam:  Vital signs: Pulse 68   Temp 98.7 F (37.1 C)   Ht '5\' 7"'$  (1.702 m)   Wt 168 lb (76.2 kg)   BMI 26.31 kg/m   Constitutional:   Pleasant Caucasian male appears to be in NAD, Well developed, Well nourished, alert and cooperative Head:  Normocephalic and atraumatic. Eyes:   PEERL, EOMI. No icterus. Conjunctiva pink. Ears:  Normal auditory acuity. Neck:  Supple Throat: Oral cavity and pharynx without inflammation, swelling or lesion.  Respiratory: Respirations even and unlabored. Lungs clear to auscultation bilaterally.   No wheezes, crackles, or rhonchi.  Cardiovascular: LVAD hum Gastrointestinal:  Soft, nondistended, nontender. No rebound or guarding. Normal bowel sounds. No appreciable masses or hepatomegaly. Rectal:  Not performed.  Msk:  Symmetrical without gross deformities. Without edema, no deformity or joint abnormality.  Neurologic:  Alert and  oriented x4;  grossly normal neurologically.  Skin:   Dry and intact without significant lesions or rashes. Psychiatric: Demonstrates good judgement and reason without abnormal affect or behaviors.  RELEVANT LABS AND IMAGING: CBC    Component Value Date/Time   WBC 4.8 11/01/2019 0933   RBC 4.47 11/01/2019 0933   HGB 13.3 11/01/2019 0933   HCT 42.3 11/01/2019 0933   PLT 163 11/01/2019 0933   MCV 94.6 11/01/2019 0933   MCH 29.8 11/01/2019 0933    MCHC 31.4 11/01/2019 0933   RDW 16.4 (H) 11/01/2019 0933   LYMPHSABS 1.0 06/27/2019 2004   MONOABS 0.9 06/27/2019 2004   EOSABS 0.0 06/27/2019 2004   BASOSABS 0.1 06/27/2019 2004    CMP     Component Value Date/Time   NA 137 11/01/2019 0933   K 4.1 11/01/2019 0933   CL 102 11/01/2019 0933   CO2 26 11/01/2019 0933   GLUCOSE 102 (H) 11/01/2019 0933   BUN 24 (H) 11/01/2019 0933   BUN 21 05/19/2010 0000   CREATININE 1.94 (H) 11/01/2019 0933   CALCIUM 9.0 11/01/2019 0933   PROT 6.7 11/01/2019 0933   ALBUMIN 3.6 11/01/2019 0933   AST 107 (H) 11/01/2019 0933   ALT 225 (H) 11/01/2019 0933   ALKPHOS 98 11/01/2019 0933   BILITOT 0.6 11/01/2019 0933   GFRNONAA 33 (L) 11/01/2019 0933   GFRAA 38 (L) 11/01/2019 0933   Hepatic Function Latest Ref Rng & Units 11/01/2019 10/26/2019 10/10/2019  Total Protein 6.5 - 8.1 g/dL 6.7 6.3(L) 7.4  Albumin 3.5 - 5.0 g/dL 3.6 3.6 4.0  AST 15 - 41 U/L 107(H) 88(H) 65(H)  ALT 0 - 44 U/L 225(H) 142(H) 110(H)  Alk Phosphatase 38 - 126 U/L 98 82 72  Total Bilirubin 0.3 - 1.2 mg/dL 0.6 0.9 0.9  Bilirubin, Direct 0.0 - 0.3 mg/dL - - -    Assessment: 1.  Elevated LFTs: As above, started increasing after patient was started on amiodarone in the hospital in October, this medicine was stopped by his cardiologist a week ago, no history of hepatotoxic drugs or family history of liver disease,  recent imaging with no signs of cirrhosis or other liver abnormality; most likely medication side effect versus less likely autoimmune liver disease versus other 2.  Cholelithiasis: Patient has no symptoms  Plan: 1.  Patient was on Amiodarone which was likely contributing.  Discussed further testing with the patient today and he would like to just recheck his liver enzymes now that he is off of Amiodarone to see if they are headed in the right direction.  If they are then would recommend continuing to monitor them until they come back down to normal.  If not we will order further  liver serologies. 2.  Discussed cholelithiasis.  Explained that unless he starts having symptoms there is no need for him to undergo surgery especially since he is very high risk given his LVAD. 3.  Patient to follow in clinic per recommendations after labs today.    Ellouise Newer, PA-C [facility] 11/09/2019, 9:24 AM  Cc: Lavone Orn, MD

## 2019-11-09 NOTE — Patient Instructions (Signed)
If you are age 77 or older, your body mass index should be between 23-30. Your Body mass index is 26.31 kg/m. If this is out of the aforementioned range listed, please consider follow up with your Primary Care Provider.  If you are age 53 or younger, your body mass index should be between 19-25. Your Body mass index is 26.31 kg/m. If this is out of the aformentioned range listed, please consider follow up with your Primary Care Provider.   Your provider has requested that you go to the basement level for lab work before leaving today. Press "B" on the elevator. The lab is located at the first door on the left as you exit the elevator.

## 2019-11-09 NOTE — Progress Notes (Signed)
Yep, sorry about that.  JLL

## 2019-11-15 ENCOUNTER — Ambulatory Visit (HOSPITAL_COMMUNITY)
Admission: RE | Admit: 2019-11-15 | Discharge: 2019-11-15 | Disposition: A | Discharge status: Home / Self Care | Payer: Medicare Other | Source: Ambulatory Visit | Attending: Internal Medicine | Admitting: Internal Medicine

## 2019-11-15 ENCOUNTER — Ambulatory Visit (HOSPITAL_COMMUNITY): Payer: Self-pay | Admitting: Pharmacist

## 2019-11-15 ENCOUNTER — Other Ambulatory Visit: Payer: Self-pay

## 2019-11-15 DIAGNOSIS — Z7901 Long term (current) use of anticoagulants: Secondary | ICD-10-CM

## 2019-11-15 DIAGNOSIS — Z95811 Presence of heart assist device: Secondary | ICD-10-CM

## 2019-11-15 LAB — COMPREHENSIVE METABOLIC PANEL
ALT: 88 U/L — ABNORMAL HIGH (ref 0–44)
AST: 79 U/L — ABNORMAL HIGH (ref 15–41)
Albumin: 3.8 g/dL (ref 3.5–5.0)
Alkaline Phosphatase: 97 U/L (ref 38–126)
Anion gap: 11 (ref 5–15)
BUN: 26 mg/dL — ABNORMAL HIGH (ref 8–23)
CO2: 26 mmol/L (ref 22–32)
Calcium: 9.2 mg/dL (ref 8.9–10.3)
Chloride: 101 mmol/L (ref 98–111)
Creatinine, Ser: 1.8 mg/dL — ABNORMAL HIGH (ref 0.61–1.24)
GFR calc Af Amer: 41 mL/min — ABNORMAL LOW (ref >60)
GFR calc non Af Amer: 36 mL/min — ABNORMAL LOW (ref >60)
Glucose, Bld: 92 mg/dL (ref 70–99)
Potassium: 4.8 mmol/L (ref 3.5–5.1)
Sodium: 138 mmol/L (ref 135–145)
Total Bilirubin: 0.8 mg/dL (ref 0.3–1.2)
Total Protein: 6.8 g/dL (ref 6.5–8.1)

## 2019-11-15 LAB — POCT INR: INR: 2 (ref 2.0–3.0)

## 2019-11-15 LAB — PROTIME-INR
INR: 2 — ABNORMAL HIGH (ref 0.8–1.2)
Prothrombin Time: 22.5 seconds — ABNORMAL HIGH (ref 11.4–15.2)

## 2019-11-15 LAB — LACTATE DEHYDROGENASE: LDH: 349 U/L — ABNORMAL HIGH (ref 98–192)

## 2019-11-15 MED ORDER — WARFARIN SODIUM 5 MG PO TABS: ORAL_TABLET | ORAL | 11 refills | Status: DC

## 2019-11-15 NOTE — Progress Notes (Signed)
LVAD INR 

## 2019-11-16 ENCOUNTER — Other Ambulatory Visit (HOSPITAL_COMMUNITY): Payer: Self-pay | Admitting: *Deleted

## 2019-11-16 DIAGNOSIS — Z95811 Presence of heart assist device: Secondary | ICD-10-CM

## 2019-11-16 DIAGNOSIS — Z7901 Long term (current) use of anticoagulants: Secondary | ICD-10-CM

## 2019-11-22 ENCOUNTER — Ambulatory Visit (HOSPITAL_COMMUNITY)
Admission: RE | Admit: 2019-11-22 | Discharge: 2019-11-22 | Disposition: A | Discharge status: Home / Self Care | Payer: Medicare Other | Source: Ambulatory Visit | Attending: Internal Medicine | Admitting: Internal Medicine

## 2019-11-22 ENCOUNTER — Ambulatory Visit (HOSPITAL_COMMUNITY): Payer: Self-pay | Admitting: Pharmacist

## 2019-11-22 ENCOUNTER — Other Ambulatory Visit: Payer: Self-pay

## 2019-11-22 DIAGNOSIS — Z7901 Long term (current) use of anticoagulants: Secondary | ICD-10-CM | POA: Insufficient documentation

## 2019-11-22 DIAGNOSIS — Z95811 Presence of heart assist device: Secondary | ICD-10-CM | POA: Diagnosis not present

## 2019-11-22 LAB — PROTIME-INR
INR: 2.2 — ABNORMAL HIGH (ref 0.8–1.2)
Prothrombin Time: 24 seconds — ABNORMAL HIGH (ref 11.4–15.2)

## 2019-11-22 LAB — COMPREHENSIVE METABOLIC PANEL
ALT: 95 U/L — ABNORMAL HIGH (ref 0–44)
AST: 75 U/L — ABNORMAL HIGH (ref 15–41)
Albumin: 3.9 g/dL (ref 3.5–5.0)
Alkaline Phosphatase: 86 U/L (ref 38–126)
Anion gap: 8 (ref 5–15)
BUN: 25 mg/dL — ABNORMAL HIGH (ref 8–23)
CO2: 27 mmol/L (ref 22–32)
Calcium: 9.4 mg/dL (ref 8.9–10.3)
Chloride: 102 mmol/L (ref 98–111)
Creatinine, Ser: 1.96 mg/dL — ABNORMAL HIGH (ref 0.61–1.24)
GFR calc Af Amer: 37 mL/min — ABNORMAL LOW (ref >60)
GFR calc non Af Amer: 32 mL/min — ABNORMAL LOW (ref >60)
Glucose, Bld: 102 mg/dL — ABNORMAL HIGH (ref 70–99)
Potassium: 4.9 mmol/L (ref 3.5–5.1)
Sodium: 137 mmol/L (ref 135–145)
Total Bilirubin: 1 mg/dL (ref 0.3–1.2)
Total Protein: 7.3 g/dL (ref 6.5–8.1)

## 2019-11-22 LAB — LACTATE DEHYDROGENASE: LDH: 313 U/L — ABNORMAL HIGH (ref 98–192)

## 2019-11-22 MED ORDER — WARFARIN SODIUM 5 MG PO TABS: ORAL_TABLET | ORAL | 11 refills | Status: DC

## 2019-11-22 NOTE — Progress Notes (Signed)
LVAD INR 

## 2019-11-24 ENCOUNTER — Other Ambulatory Visit (HOSPITAL_COMMUNITY): Payer: Self-pay | Admitting: Unknown Physician Specialty

## 2019-11-24 DIAGNOSIS — Z95811 Presence of heart assist device: Secondary | ICD-10-CM

## 2019-11-24 DIAGNOSIS — Z7901 Long term (current) use of anticoagulants: Secondary | ICD-10-CM

## 2019-11-29 ENCOUNTER — Other Ambulatory Visit: Payer: Self-pay

## 2019-11-29 ENCOUNTER — Ambulatory Visit (HOSPITAL_COMMUNITY)
Admission: RE | Admit: 2019-11-29 | Discharge: 2019-11-29 | Disposition: A | Discharge status: Home / Self Care | Payer: Medicare Other | Source: Ambulatory Visit | Attending: Cardiology | Admitting: Cardiology

## 2019-11-29 ENCOUNTER — Ambulatory Visit (HOSPITAL_COMMUNITY): Payer: Self-pay | Admitting: Pharmacist

## 2019-11-29 DIAGNOSIS — Z95811 Presence of heart assist device: Secondary | ICD-10-CM | POA: Insufficient documentation

## 2019-11-29 DIAGNOSIS — Z7901 Long term (current) use of anticoagulants: Secondary | ICD-10-CM | POA: Diagnosis not present

## 2019-11-29 LAB — PROTIME-INR
INR: 2.8 — ABNORMAL HIGH (ref 0.8–1.2)
Prothrombin Time: 29.8 seconds — ABNORMAL HIGH (ref 11.4–15.2)

## 2019-11-29 LAB — COMPREHENSIVE METABOLIC PANEL
ALT: 87 U/L — ABNORMAL HIGH (ref 0–44)
AST: 75 U/L — ABNORMAL HIGH (ref 15–41)
Albumin: 3.9 g/dL (ref 3.5–5.0)
Alkaline Phosphatase: 100 U/L (ref 38–126)
Anion gap: 11 (ref 5–15)
BUN: 26 mg/dL — ABNORMAL HIGH (ref 8–23)
CO2: 24 mmol/L (ref 22–32)
Calcium: 9.2 mg/dL (ref 8.9–10.3)
Chloride: 102 mmol/L (ref 98–111)
Creatinine, Ser: 1.98 mg/dL — ABNORMAL HIGH (ref 0.61–1.24)
GFR calc Af Amer: 37 mL/min — ABNORMAL LOW (ref >60)
GFR calc non Af Amer: 32 mL/min — ABNORMAL LOW (ref >60)
Glucose, Bld: 109 mg/dL — ABNORMAL HIGH (ref 70–99)
Potassium: 4.1 mmol/L (ref 3.5–5.1)
Sodium: 137 mmol/L (ref 135–145)
Total Bilirubin: 1 mg/dL (ref 0.3–1.2)
Total Protein: 6.9 g/dL (ref 6.5–8.1)

## 2019-11-29 LAB — LACTATE DEHYDROGENASE: LDH: 391 U/L — ABNORMAL HIGH (ref 98–192)

## 2019-11-29 NOTE — Progress Notes (Signed)
LVAD INR 

## 2019-12-08 ENCOUNTER — Other Ambulatory Visit (HOSPITAL_COMMUNITY): Payer: Self-pay | Admitting: *Deleted

## 2019-12-08 DIAGNOSIS — Z7901 Long term (current) use of anticoagulants: Secondary | ICD-10-CM

## 2019-12-08 DIAGNOSIS — Z95811 Presence of heart assist device: Secondary | ICD-10-CM

## 2019-12-12 ENCOUNTER — Encounter (HOSPITAL_COMMUNITY): Payer: Medicare Other

## 2019-12-12 ENCOUNTER — Other Ambulatory Visit (HOSPITAL_COMMUNITY): Payer: Medicare Other

## 2019-12-18 ENCOUNTER — Other Ambulatory Visit (HOSPITAL_COMMUNITY): Payer: Self-pay | Admitting: Unknown Physician Specialty

## 2019-12-18 DIAGNOSIS — Z95811 Presence of heart assist device: Secondary | ICD-10-CM

## 2019-12-18 DIAGNOSIS — Z7901 Long term (current) use of anticoagulants: Secondary | ICD-10-CM

## 2019-12-19 ENCOUNTER — Ambulatory Visit (HOSPITAL_COMMUNITY)
Admission: RE | Admit: 2019-12-19 | Discharge: 2019-12-19 | Disposition: A | Discharge status: Home / Self Care | Payer: Medicare Other | Source: Ambulatory Visit | Attending: Cardiology | Admitting: Cardiology

## 2019-12-19 ENCOUNTER — Other Ambulatory Visit (HOSPITAL_COMMUNITY): Payer: Self-pay | Admitting: Internal Medicine

## 2019-12-19 ENCOUNTER — Ambulatory Visit (HOSPITAL_COMMUNITY): Payer: Self-pay | Admitting: Pharmacist

## 2019-12-19 ENCOUNTER — Other Ambulatory Visit: Payer: Self-pay

## 2019-12-19 DIAGNOSIS — Z7982 Long term (current) use of aspirin: Secondary | ICD-10-CM | POA: Insufficient documentation

## 2019-12-19 DIAGNOSIS — I252 Old myocardial infarction: Secondary | ICD-10-CM | POA: Insufficient documentation

## 2019-12-19 DIAGNOSIS — I255 Ischemic cardiomyopathy: Secondary | ICD-10-CM

## 2019-12-19 DIAGNOSIS — Z95810 Presence of automatic (implantable) cardiac defibrillator: Secondary | ICD-10-CM | POA: Diagnosis not present

## 2019-12-19 DIAGNOSIS — I472 Ventricular tachycardia, unspecified: Secondary | ICD-10-CM

## 2019-12-19 DIAGNOSIS — Z79899 Other long term (current) drug therapy: Secondary | ICD-10-CM | POA: Diagnosis not present

## 2019-12-19 DIAGNOSIS — I2510 Atherosclerotic heart disease of native coronary artery without angina pectoris: Secondary | ICD-10-CM | POA: Diagnosis not present

## 2019-12-19 DIAGNOSIS — E039 Hypothyroidism, unspecified: Secondary | ICD-10-CM | POA: Diagnosis not present

## 2019-12-19 DIAGNOSIS — I480 Paroxysmal atrial fibrillation: Secondary | ICD-10-CM

## 2019-12-19 DIAGNOSIS — I10 Essential (primary) hypertension: Secondary | ICD-10-CM

## 2019-12-19 DIAGNOSIS — Z95811 Presence of heart assist device: Secondary | ICD-10-CM

## 2019-12-19 DIAGNOSIS — I5022 Chronic systolic (congestive) heart failure: Secondary | ICD-10-CM

## 2019-12-19 DIAGNOSIS — E785 Hyperlipidemia, unspecified: Secondary | ICD-10-CM | POA: Insufficient documentation

## 2019-12-19 DIAGNOSIS — E7800 Pure hypercholesterolemia, unspecified: Secondary | ICD-10-CM | POA: Diagnosis not present

## 2019-12-19 DIAGNOSIS — N1832 Chronic kidney disease, stage 3b: Secondary | ICD-10-CM | POA: Insufficient documentation

## 2019-12-19 DIAGNOSIS — R2689 Other abnormalities of gait and mobility: Secondary | ICD-10-CM | POA: Diagnosis not present

## 2019-12-19 DIAGNOSIS — Z7901 Long term (current) use of anticoagulants: Secondary | ICD-10-CM | POA: Diagnosis not present

## 2019-12-19 DIAGNOSIS — Z8674 Personal history of sudden cardiac arrest: Secondary | ICD-10-CM | POA: Insufficient documentation

## 2019-12-19 DIAGNOSIS — R251 Tremor, unspecified: Secondary | ICD-10-CM | POA: Insufficient documentation

## 2019-12-19 DIAGNOSIS — I130 Hypertensive heart and chronic kidney disease with heart failure and stage 1 through stage 4 chronic kidney disease, or unspecified chronic kidney disease: Secondary | ICD-10-CM | POA: Diagnosis not present

## 2019-12-19 DIAGNOSIS — R748 Abnormal levels of other serum enzymes: Secondary | ICD-10-CM | POA: Insufficient documentation

## 2019-12-19 LAB — COMPREHENSIVE METABOLIC PANEL
ALT: 55 U/L — ABNORMAL HIGH (ref 0–44)
AST: 51 U/L — ABNORMAL HIGH (ref 15–41)
Albumin: 3.9 g/dL (ref 3.5–5.0)
Alkaline Phosphatase: 78 U/L (ref 38–126)
Anion gap: 10 (ref 5–15)
BUN: 24 mg/dL — ABNORMAL HIGH (ref 8–23)
CO2: 24 mmol/L (ref 22–32)
Calcium: 9.1 mg/dL (ref 8.9–10.3)
Chloride: 105 mmol/L (ref 98–111)
Creatinine, Ser: 1.95 mg/dL — ABNORMAL HIGH (ref 0.61–1.24)
GFR calc Af Amer: 38 mL/min — ABNORMAL LOW (ref >60)
GFR calc non Af Amer: 32 mL/min — ABNORMAL LOW (ref >60)
Glucose, Bld: 95 mg/dL (ref 70–99)
Potassium: 3.7 mmol/L (ref 3.5–5.1)
Sodium: 139 mmol/L (ref 135–145)
Total Bilirubin: 0.7 mg/dL (ref 0.3–1.2)
Total Protein: 7 g/dL (ref 6.5–8.1)

## 2019-12-19 LAB — LACTATE DEHYDROGENASE: LDH: 291 U/L — ABNORMAL HIGH (ref 98–192)

## 2019-12-19 LAB — PROTIME-INR
INR: 2.5 — ABNORMAL HIGH (ref 0.8–1.2)
Prothrombin Time: 26.6 seconds — ABNORMAL HIGH (ref 11.4–15.2)

## 2019-12-19 LAB — CBC
HCT: 42.6 % (ref 39.0–52.0)
Hemoglobin: 13.5 g/dL (ref 13.0–17.0)
MCH: 29.7 pg (ref 26.0–34.0)
MCHC: 31.7 g/dL (ref 30.0–36.0)
MCV: 93.6 fL (ref 80.0–100.0)
Platelets: 153 10*3/uL (ref 150–400)
RBC: 4.55 MIL/uL (ref 4.22–5.81)
RDW: 16.3 % — ABNORMAL HIGH (ref 11.5–15.5)
WBC: 6.2 10*3/uL (ref 4.0–10.5)
nRBC: 0 % (ref 0.0–0.2)

## 2019-12-19 NOTE — Addendum Note (Signed)
Encounter addended by: Jolaine Artist, MD on: 12/19/2019 11:47 AM  Actions taken: Pend clinical note

## 2019-12-19 NOTE — Progress Notes (Signed)
LVAD INR 

## 2019-12-19 NOTE — Addendum Note (Signed)
Encounter addended by: Jolaine Artist, MD on: 12/19/2019 11:48 AM  Actions taken: Level of Service modified, Visit diagnoses modified, Charge Capture section accepted

## 2019-12-19 NOTE — Addendum Note (Signed)
Encounter addended by: Lezlie Octave, RN on: 12/19/2019 11:55 AM  Actions taken: Clinical Note Signed

## 2019-12-19 NOTE — Progress Notes (Signed)
VAD CLINIC NOTE   HPI:  Johnny Oneill is a 79 y.o. 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   Admitted 3/4-3/25/15 with SOB and syncopal episode. Found to have SBO and NG placed and started on TPN. On 11/06/13 developed progressive SOB and hypotension and co-ox 51% and Hgb 6.7. Started on milrinone and transfused. Echo showed nl RV function. Patient had PEA arrest and was intubated and started on pressors. CXR revealed a R hemothorax and a R CT placed. He required VATs and evacuation of hemothorax and was then teansferred to CIR.   LVAD Issues  Elevated LDH--bivalirudin. D/C LDH 188  Presented to ER on 06/27/19 with syncopal episode and head injury from fall. ECG showed VT 240. Underwent emergent DC-CV in ER. Head CT with no bleed. Was admitted and had IC gen replacement (EOL). Had small hematoma at site. Amio started. Post-discharge found to have episode of AFL on ICD remote check but has since resolved.    Patient presents for routine visit in [facility] today. Feels much better after amio stopped. No more tremors. Says he has been very active lately with the church and errands without problem. However several people have told him that he looks tired. Says he gets up at 3a to pee and can't go back to sleep. Denies orthopnea or PND. No fevers, chills or problems with driveline. No bleeding, melena or neuro symptoms. No VAD alarms. Taking all meds as prescribed.    VAD Indication: Destination therapy- patient choice    VAD interrogation & Equipment Management: Speed: 9200 Flow:  5.0 Power: 5.3w PI: 6.7  Alarms: none Events: 5- 10 PI events daily  Fixed speed 9200  Low speed limit: 8600  Primary Controller: Replace back up battery in 9 months Back up controller: Replace back up battery in 6 months  Exit Site Care: Drive line is being maintained weekly by [name], his wife. Dressing  dry and intact. Stabilization device present and accurately applied. Pt denies fever or chills. Pt has CHG allergy. Pt has sufficient dressing kits at home.     Past Medical History:  Diagnosis Date  . Anxiety   . Automatic implantable cardioverter-defibrillator in situ   . CAD (coronary artery disease)    S/P stenting of LCX in 2004;  s/p Lat MI 2009 with occlusion of the LCX - treated with Promus stenting. Has total occlusion of the RCA.   [name] CHF (congestive heart failure) ([place])   . Hypercholesteremia   . Hypertension   . Hypothyroidism   . ICD (implantable cardiac defibrillator) in place    PROPHYLACTIC      medtronic  . Inferior MI ([place]) "? date"; 2009  . Ischemic cardiomyopathy    a. 10/09 Echo: Sev LV Dysfxn, inf/lat AK, Mod MR.  . Liver disease   . LVAD (left ventricular assist device) present ([place])   . Osteoarthritis    a. s/p R TKR  . Pacemaker   . PVC (premature ventricular contraction)   . RBBB   . Shortness of breath     Current Outpatient Medications  Medication Sig Dispense Refill  . acetaminophen (TYLENOL) 500 MG chewable tablet Chew 500 mg by mouth every 6 (six) hours as needed for pain.    [name] aspirin EC 81 MG tablet Take 81 mg by mouth daily.    . cholecalciferol (VITAMIN D3) 25 MCG (1000 UT) tablet Take  1,000 Units by mouth daily.    . citalopram (CELEXA) 10 MG tablet Take 1 tablet by mouth once daily 90 tablet 0  . Coenzyme Q10 (COQ10) 200 MG CAPS Take 200 mg by mouth daily.    . EUTHYROX 150 MCG tablet 1 tablet before breakfast daily except half tablet on Mondays 90 tablet 1  . famotidine (PEPCID) 20 MG tablet Take 20 mg by mouth daily.     . hydrALAZINE (APRESOLINE) 50 MG tablet TAKE 1 TABLET BY MOUTH THREE TIMES DAILY 270 tablet 3  . Melatonin 3 MG TABS Take 1 tablet by mouth at bedtime.    . pantoprazole (PROTONIX) 40 MG tablet Take 1 tablet by mouth once daily 90 tablet 11  . Probiotic Product (PROBIOTIC PO) Take 1 capsule by mouth daily.    .  rosuvastatin (CRESTOR) 20 MG tablet Take 1 tablet by mouth once daily 90 tablet 3  . spironolactone (ALDACTONE) 25 MG tablet Take 1 tablet by mouth once daily 90 tablet 3  . traZODone (DESYREL) 50 MG tablet Take 1 tablet (50 mg total) by mouth at bedtime. 30 tablet 5  . warfarin (COUMADIN) 5 MG tablet Take warfarin 7.5 mg (1.5 tab) Monday/Friday and 5 mg (1 tab) all other days or as directed by HF clinic 60 tablet 11  . furosemide (LASIX) 20 MG tablet Take 20 mg by mouth daily as needed (Take 20 mg as daily as needed for wt > 153 lbs). Reported on 01/14/2016    . potassium chloride SA (K-DUR,KLOR-CON) 20 MEQ tablet Take 1 tablet (20 mEq total) by mouth daily as needed. Take one potassium pill when you take your lasix. (Patient not taking: Reported on 12/19/2019) 90 tablet 3   No current facility-administered medications for this encounter.   Facility-Administered Medications Ordered in Other Encounters  Medication Dose Route Frequency Provider Last Rate Last Admin  . etomidate (AMIDATE) injection    Anesthesia Intra-op Myna Bright, CRNA   20 mg at 02/08/14 0915  . rocuronium [facility]) injection    Anesthesia Intra-op Myna Bright, CRNA   50 mg at 02/08/14 0932  . succinylcholine (ANECTINE) injection    Anesthesia Intra-op Myna Bright, CRNA   100 mg at 02/08/14 0915   Ace inhibitors, Diovan [valsartan], Lipitor [atorvastatin calcium], and Norvasc [amlodipine]  Review of systems complete and found to be negative unless listed in HPI.     Vital Signs:  Doppler Pressure: 78 Automatc BP:  94/64 (76) HR: 60 SPO2: 97% on RA  Weight: 164 lb w/o eqt Last weight: 167.6 lb  Physical Exam: General:  NAD.  HEENT: normal  Neck: supple. JVP not elevated.  Carotids 2+ bilat; no bruits. No lymphadenopathy or thryomegaly appreciated. Cor: LVAD hum.  Lungs: Clear. Abdomen: soft, nontender, non-distended. No hepatosplenomegaly. No bruits or masses. Good bowel sounds. Driveline  site clean. Anchor in place.  Extremities: no cyanosis, clubbing, rash. Warm no edema  Neuro: alert & oriented x 3. No focal deficits. Moves all 4 without problem    ASSESSMENT AND PLAN:   1) Tremors & imbalance - resolved with stopping amio  2) Chronic systolic HF: ICM, EF 63-87% s/p LVAD HM II implant 08/2013 for DT.   - Echo 3/18 EF 10-15% mild AI. Mod RV dysfunction. LVAD cannula OK - Stable NYHA I. No change - Volume status looks good.  - Continue spiro 25 mg daily.  - He has not tolerated ACE-I or amlodipine or b-blocker in the past.  3)  LVAD: HMII 08/2013 for DT. - VAD interrogated personally. Parameters stable. - Admitted in May 2018 with elevated LDH with bival.  - LDH 391 -> 291  - Continue aspirin and coumadin.  - Hgb 13.5 stable - Driveline site looks good - MAPs good  4) VT - stable. No further events. Suspect possible suction event as trigger - now s/p ICD gen change. Site improved - Off amio due to cerebellar side effects. Had ICD check soon with EP. If having more VT will likely need mexilitene - Keep K> 4.0 Mg > 2.0- No driving x 6 months - Follows with Dr. Curt Bears   5) Chronic anticoagulation: INR goal 2.0-3.0.    - INR 2.5 Discussed dosing with PharmD personally. No bleeding - Continue ASA 81 mg for LVAD + warfarin.   6) HTN:  - Blood pressure well controlled. Continue current regimen.  7) PAFL - Remains in NSR. On warfarin   8) Hypothyroidism:  - Followed by Dr Dwyane Dee. Stable.   9) Hyperlipidemia:  - Continue Crestor. Zetia stopped due to cost.  - No change.   8) CAD - n/s of ischemia  9) AKI on CKD IIIb - stable 1.95 today - will follow. Encouraged him to stay hydrated   10) Elevated liver enzymes - improving off amio. Liver u/s ok  Total time spent 35 minutes. Over half that time spent discussing above.    Glori Bickers, MD  11:39 AM

## 2019-12-19 NOTE — Addendum Note (Signed)
Encounter addended by: Lezlie Octave, RN on: 12/19/2019 11:20 AM  Actions taken: Order list changed, Order Reconciliation Section accessed, Home Medications modified, Medication List reviewed, Medication taking status modified, Clinical Note Signed

## 2019-12-19 NOTE — Patient Instructions (Signed)
1. No change in medications. 2. Johnny Oneill will call you with INR results and warfarin dosing 3  Return to [facility] in 2 months with 6.5 year Intermacs follow up

## 2019-12-19 NOTE — Progress Notes (Signed)
Patient presents for 2 month follow up visit in [facility] today. Reports no problems with VAD equipment or concerns with drive line.   Patient's wife called VAD Coordinator last night to report concern about Johnny Oneill being "more tired" and "slower than ususal". Says this has started fairly recently. Pt had full clinic appointment with labs scheduled today and she felt it was safe to wait until this visit for him to be seen; denied need for ED visit.  Pt walked into clinic and was very pleasant, speaking with everyone as usual. He spoke with another VAD patient and offered support, supplies, and contact information shared.  Johnny Oneill admits he may be "overdoing it", is "always on the go", and isn't sleeping through the night. Reports he wakes in am around 3 am and is unable to go back to sleep. Denies any increased SOB, abdominal distention, orthopnea, PND, pedal edema, palpitations, or ICD shocks. He says he has not needed any prn Lasix since last visit.   Pt confirms he stopped Amiodarone on 11/06/19 and has remote device check scheduled with [facility] next week on 11/27/19.  Vital Signs:  Doppler Pressure: 78 Automatc BP:  94/64 (76) HR: 60 SPO2: 97% on RA  Weight: 164 lb w/o eqt Last weight: 167.6 lb  VAD Indication: Destination therapy- patient choice    VAD interrogation & Equipment Management: Speed: 9200 Flow:  5.0 Power: 5.3w    PI: 6.7  Alarms: none Events: 5- 10 PI events daily  Fixed speed 9200  Low speed limit: 8600  Primary Controller:  Replace back up battery in 9 months Back up controller:  Replace back up battery in 6 months  Annual Equipment Maintenance on UBC/PM was performed on 05/2019  I reviewed the LVAD parameters from today and compared the results to the patient's prior recorded data. LVAD interrogation was NEGATIVE for significant power changes, NEGATIVE for clinical alarms and STABLE for PI events/speed drops. No programming changes were made and pump  is functioning within specified parameters. Pt is performing daily controller and system monitor self tests along with completing weekly and monthly maintenance for LVAD equipment.  LDH trending down, 291 today from 391 last check. LFTs trending down as well. Pt is following up with Dr. Dwyane Dee for thyroid follow up.  Exit Site Care: Drive line is being maintained weekly by [name], his wife. Dressing dry and intact. Stabilization device present and accurately applied. Pt denies fever or chills. Pt has CHG allergy. Pt provided with 4 weekly dressing kits.    Significant Events on VAD Support:  10/2013>SBO 12/2016> elevated LDH, admit for Bival  Device:Dual Medtronic Therapies:  V therapy on at 222 bpm; V monitor on 171         A therapy on 171 bpm (ramp and burst overdrive paing); A monitor on 300 bpm  Pacing: AAI<=>DDD 60 bpm  Last check: 09/28/19  BP & Labs:  Doppler 78 - Doppler is reflecting MAP  Hgb 13.5 - No S/S of bleeding. Specifically denies melena/BRBPR or nosebleeds.  LDH 291 and is within established baseline of 200- 350. Denies tea-colored urine. No power elevations noted on interrogation.    Patient Instructions:  1. No change in medications. 2. Lauren will call with INR results and warfarin dosint. 3. Return to [facility] in 2 months with 6.5 year Intermacs follow up.   Zada Girt RN [place] Coordinator  Office: [phone]  24/7 Pager: [phone]

## 2019-12-20 NOTE — Addendum Note (Signed)
Encounter addended by: Jolaine Artist, MD on: 12/20/2019 3:06 PM  Actions taken: Charge Capture section accepted, Clinical Note Signed

## 2019-12-28 ENCOUNTER — Ambulatory Visit (INDEPENDENT_AMBULATORY_CARE_PROVIDER_SITE_OTHER): Payer: Medicare Other | Admitting: *Deleted

## 2019-12-28 DIAGNOSIS — R55 Syncope and collapse: Secondary | ICD-10-CM

## 2019-12-28 LAB — CUP PACEART REMOTE DEVICE CHECK
Battery Remaining Longevity: 110 mo
Battery Voltage: 3.05 V
Brady Statistic AP VP Percent: 24.75 %
Brady Statistic AP VS Percent: 29.69 %
Brady Statistic AS VP Percent: 24.58 %
Brady Statistic AS VS Percent: 20.97 %
Brady Statistic RA Percent Paced: 54.29 %
Brady Statistic RV Percent Paced: 49.28 %
Date Time Interrogation Session: 20210429042406
HighPow Impedance: 39 Ohm
HighPow Impedance: 48 Ohm
Implantable Lead Implant Date: 20091103
Implantable Lead Implant Date: 20091103
Implantable Lead Location: 753859
Implantable Lead Location: 753860
Implantable Lead Model: 5076
Implantable Lead Model: 6947
Implantable Pulse Generator Implant Date: 20201028
Lead Channel Impedance Value: 342 Ohm
Lead Channel Impedance Value: 399 Ohm
Lead Channel Impedance Value: 418 Ohm
Lead Channel Pacing Threshold Amplitude: 0.5 V
Lead Channel Pacing Threshold Amplitude: 1.375 V
Lead Channel Pacing Threshold Pulse Width: 0.4 ms
Lead Channel Pacing Threshold Pulse Width: 0.4 ms
Lead Channel Sensing Intrinsic Amplitude: 2.25 mV
Lead Channel Sensing Intrinsic Amplitude: 2.25 mV
Lead Channel Sensing Intrinsic Amplitude: 3.875 mV
Lead Channel Sensing Intrinsic Amplitude: 3.875 mV
Lead Channel Setting Pacing Amplitude: 1.5 V
Lead Channel Setting Pacing Amplitude: 2.75 V
Lead Channel Setting Pacing Pulse Width: 0.6 ms
Lead Channel Setting Sensing Sensitivity: 0.3 mV

## 2019-12-29 DIAGNOSIS — H2513 Age-related nuclear cataract, bilateral: Secondary | ICD-10-CM | POA: Diagnosis not present

## 2019-12-29 DIAGNOSIS — H5203 Hypermetropia, bilateral: Secondary | ICD-10-CM | POA: Diagnosis not present

## 2019-12-29 DIAGNOSIS — H1789 Other corneal scars and opacities: Secondary | ICD-10-CM | POA: Diagnosis not present

## 2019-12-29 NOTE — Progress Notes (Signed)
ICD Remote  

## 2020-01-04 ENCOUNTER — Other Ambulatory Visit (HOSPITAL_COMMUNITY): Payer: Self-pay | Admitting: Unknown Physician Specialty

## 2020-01-04 DIAGNOSIS — Z95811 Presence of heart assist device: Secondary | ICD-10-CM

## 2020-01-04 DIAGNOSIS — Z7901 Long term (current) use of anticoagulants: Secondary | ICD-10-CM

## 2020-01-09 ENCOUNTER — Other Ambulatory Visit: Payer: Self-pay

## 2020-01-09 ENCOUNTER — Ambulatory Visit (HOSPITAL_COMMUNITY): Payer: Self-pay | Admitting: Pharmacist

## 2020-01-09 ENCOUNTER — Ambulatory Visit (HOSPITAL_COMMUNITY)
Admission: RE | Admit: 2020-01-09 | Discharge: 2020-01-09 | Disposition: A | Discharge status: Home / Self Care | Payer: Medicare Other | Source: Ambulatory Visit | Attending: Cardiology | Admitting: Cardiology

## 2020-01-09 DIAGNOSIS — Z7901 Long term (current) use of anticoagulants: Secondary | ICD-10-CM

## 2020-01-09 DIAGNOSIS — Z95811 Presence of heart assist device: Secondary | ICD-10-CM

## 2020-01-09 LAB — PROTIME-INR
INR: 2.7 — ABNORMAL HIGH (ref 0.8–1.2)
Prothrombin Time: 27.7 seconds — ABNORMAL HIGH (ref 11.4–15.2)

## 2020-01-09 LAB — LACTATE DEHYDROGENASE: LDH: 229 U/L — ABNORMAL HIGH (ref 98–192)

## 2020-01-09 NOTE — Progress Notes (Signed)
LVAD INR 

## 2020-01-26 ENCOUNTER — Other Ambulatory Visit: Payer: Self-pay

## 2020-01-26 ENCOUNTER — Other Ambulatory Visit (HOSPITAL_COMMUNITY): Payer: Self-pay | Admitting: Unknown Physician Specialty

## 2020-01-26 ENCOUNTER — Other Ambulatory Visit: Payer: Medicare Other

## 2020-01-26 ENCOUNTER — Other Ambulatory Visit (INDEPENDENT_AMBULATORY_CARE_PROVIDER_SITE_OTHER): Payer: Medicare Other

## 2020-01-26 DIAGNOSIS — E890 Postprocedural hypothyroidism: Secondary | ICD-10-CM

## 2020-01-26 DIAGNOSIS — Z95811 Presence of heart assist device: Secondary | ICD-10-CM

## 2020-01-26 DIAGNOSIS — Z7901 Long term (current) use of anticoagulants: Secondary | ICD-10-CM

## 2020-01-26 LAB — T4, FREE: Free T4: 1.56 ng/dL (ref 0.60–1.60)

## 2020-01-26 LAB — TSH: TSH: 0.68 u[IU]/mL (ref 0.35–4.50)

## 2020-01-30 ENCOUNTER — Other Ambulatory Visit: Payer: Self-pay

## 2020-01-30 ENCOUNTER — Ambulatory Visit (HOSPITAL_COMMUNITY)
Admission: RE | Admit: 2020-01-30 | Discharge: 2020-01-30 | Disposition: A | Discharge status: Home / Self Care | Payer: Medicare Other | Source: Ambulatory Visit | Attending: Cardiology | Admitting: Cardiology

## 2020-01-30 ENCOUNTER — Ambulatory Visit (HOSPITAL_COMMUNITY): Payer: Self-pay | Admitting: Pharmacist

## 2020-01-30 DIAGNOSIS — Z7901 Long term (current) use of anticoagulants: Secondary | ICD-10-CM | POA: Insufficient documentation

## 2020-01-30 DIAGNOSIS — Z95811 Presence of heart assist device: Secondary | ICD-10-CM | POA: Diagnosis not present

## 2020-01-30 LAB — PROTIME-INR
INR: 2.4 — ABNORMAL HIGH (ref 0.8–1.2)
Prothrombin Time: 25.1 seconds — ABNORMAL HIGH (ref 11.4–15.2)

## 2020-01-30 NOTE — Addendum Note (Signed)
Encounter addended by: Christinia Gully, RN on: 01/30/2020 2:08 PM  Actions taken: Order list changed, Diagnosis association updated

## 2020-01-30 NOTE — Progress Notes (Signed)
LVAD INR 

## 2020-01-31 ENCOUNTER — Encounter: Payer: Self-pay | Admitting: Endocrinology

## 2020-01-31 ENCOUNTER — Ambulatory Visit (INDEPENDENT_AMBULATORY_CARE_PROVIDER_SITE_OTHER): Payer: Medicare Other | Admitting: Endocrinology

## 2020-01-31 VITALS — BP 90/62 | HR 81 | Ht 67.0 in | Wt 165.2 lb

## 2020-01-31 DIAGNOSIS — I255 Ischemic cardiomyopathy: Secondary | ICD-10-CM | POA: Diagnosis not present

## 2020-01-31 DIAGNOSIS — E890 Postprocedural hypothyroidism: Secondary | ICD-10-CM | POA: Diagnosis not present

## 2020-01-31 NOTE — Progress Notes (Signed)
Patient ID: Johnny Oneill, male   DOB: 1941/01/06, 79 y.o.   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  Previously was taking 200 mcg daily after 3/15 when he was hospitalized   Subsequently the dose was adjusted periodically    Current regimen: he has been taking 137 mcg daily  Previously his TSH was high with taking 125 mcg, follow-up was normal in 2/21  He has recently felt fairly well with no unusual fatigue.  Also no shakiness or jitteriness recently His weight is overall the same He gets his prescription from [facility] as before, previously getting Sandoz brand and now has been told to start taking the Euthyrox brand to keep it consistent  He is very regular with taking the tablet in the morning at least 30 minutes before breakfast with water.   Does not take any iron or calcium-containing vitamins at the same time, just taking vitamin D  His TSH which was back to normal in 2/21 is now slightly lower at 0.68 Free T4 appears to be relatively higher also    Wt Readings from Last 3 Encounters:  01/31/20 165 lb 3.2 oz (74.9 kg)  11/09/19 168 lb (76.2 kg)  10/10/19 164 lb 6.4 oz (74.6 kg)    Lab Results  Component Value Date   TSH 0.68 01/26/2020   TSH 2.098 10/10/2019   TSH 9.28 (H) 08/18/2019   FREET4 1.56 01/26/2020   FREET4 1.26 08/18/2019   FREET4 1.09 05/17/2019      Allergies as of 01/31/2020      Reactions   Ace Inhibitors    Hypotension and elevated creatinine   Diovan [valsartan] Other (See Comments)   Hypotension at low dose, hyperkalemia   Lipitor [atorvastatin Calcium] Other (See  Comments)   Muscle pain   Norvasc [amlodipine] Other (See Comments)   Put patient to sleep      Medication List       Accurate as of January 31, 2020  3:49 PM. If you have any questions, ask your nurse or doctor.        acetaminophen 500 MG chewable tablet Commonly known as: TYLENOL Chew 500 mg by mouth every 6 (six) hours as needed for pain.   aspirin EC 81 MG tablet Take 81 mg by mouth daily.   cholecalciferol 25 MCG (1000 UNIT) tablet Commonly known as: VITAMIN D3 Take 1,000 Units by mouth daily.   citalopram 10 MG tablet Commonly known as: CELEXA Take 1 tablet by mouth once daily   CoQ10 200 MG Caps Take 200 mg by mouth daily.   Euthyrox 137 MCG tablet Generic drug: levothyroxine Take 137 mcg by mouth daily before breakfast. What changed: Another medication with the same name was removed. Continue taking this medication, and follow the directions you see here. Changed by: Elayne Snare, MD   famotidine 20 MG tablet Commonly known as: PEPCID Take 20 mg by mouth daily.   furosemide 20 MG tablet Commonly known as: LASIX Take 20 mg by mouth daily as needed (Take 20 mg as daily as needed for wt > 153 lbs). Reported on 01/14/2016   hydrALAZINE 50 MG tablet Commonly  known as: APRESOLINE TAKE 1 TABLET BY MOUTH THREE TIMES DAILY   melatonin 3 MG Tabs tablet Take 1 tablet by mouth at bedtime.   pantoprazole 40 MG tablet Commonly known as: PROTONIX Take 1 tablet by mouth once daily   potassium chloride SA 20 MEQ tablet Commonly known as: KLOR-CON Take 1 tablet (20 mEq total) by mouth daily as needed. Take one potassium pill when you take your lasix.   PROBIOTIC PO Take 1 capsule by mouth daily.   rosuvastatin 20 MG tablet Commonly known as: CRESTOR Take 1 tablet by mouth once daily   spironolactone 25 MG tablet Commonly known as: ALDACTONE Take 1 tablet by mouth once daily   traZODone 50 MG tablet Commonly known as: DESYREL Take 1 tablet (50 mg total) by  mouth at bedtime.   warfarin 5 MG tablet Commonly known as: COUMADIN Take as directed by the anticoagulation clinic. If you are unsure how to take this medication, talk to your nurse or doctor. Original instructions: Take warfarin 7.5 mg (1.5 tab) Monday/Friday and 5 mg (1 tab) all other days or as directed by HF clinic        Past Medical History:  Diagnosis Date  . Anxiety   . Automatic implantable cardioverter-defibrillator in situ   . CAD (coronary artery disease)    S/P stenting of LCX in 2004;  s/p Lat MI 2009 with occlusion of the LCX - treated with Promus stenting. Has total occlusion of the RCA.   [name] CHF (congestive heart failure) ([place])   . Hypercholesteremia   . Hypertension   . Hypothyroidism   . ICD (implantable cardiac defibrillator) in place    PROPHYLACTIC      medtronic  . Inferior MI ([place]) "? date"; 2009  . Ischemic cardiomyopathy    a. 10/09 Echo: Sev LV Dysfxn, inf/lat AK, Mod MR.  . Liver disease   . LVAD (left ventricular assist device) present ([place])   . Osteoarthritis    a. s/p R TKR  . Pacemaker   . PVC (premature ventricular contraction)   . RBBB   . Shortness of breath     Past Surgical History:  Procedure Laterality Date  . CARPAL TUNNEL RELEASE Right 05/29/2016   Procedure: CARPAL TUNNEL RELEASE;  Surgeon: Earlie Server, MD;  Location: [facility];  Service: Orthopedics;  Laterality: Right;  . CORONARY ANGIOPLASTY WITH STENT PLACEMENT  2004  . CORONARY ANGIOPLASTY WITH STENT PLACEMENT  07/2008  . DEFIBRILLATOR  2009  . ESOPHAGOGASTRODUODENOSCOPY N/A 09/15/2013   Procedure: ESOPHAGOGASTRODUODENOSCOPY (EGD);  Surgeon: Ladene Artist, MD;  Location[facility] ENDOSCOPY;  Service: Endoscopy;  Laterality: N/A;  bedside  . HEMATOMA EVACUATION Right 11/06/2013   Procedure: EVACUATION HEMATOMA;  Surgeon: Gaye Pollack, MD;  Location: [place];  Service: Cardiothoracic;  Laterality: Right;  . ICD GENERATOR CHANGEOUT N/A 06/28/2019   Procedure: ICD GENERATOR CHANGEOUT;   Surgeon: Constance Haw, MD;  Location: [facility] CV LAB;  Service: Cardiovascular;  Laterality: N/A;  . INSERT / REPLACE / REMOVE PACEMAKER  2009  . INSERTION OF IMPLANTABLE LEFT VENTRICULAR ASSIST DEVICE N/A 09/18/2013   Procedure: INSERTION OF IMPLANTABLE LEFT VENTRICULAR ASSIST DEVICE;  Surgeon: Ivin Poot, MD;  Location: [facility];  Service: Open Heart Surgery;  Laterality: N/A;  CIRC ARREST  NITRIC OXIDE  . INTRA-AORTIC BALLOON PUMP INSERTION N/A 09/14/2013   Procedure: INTRA-AORTIC BALLOON PUMP INSERTION;  Surgeon: Jolaine Artist, MD;  Location: [facility] C[facility] LAB;  Service: Cardiovascular;  Laterality: N/A;  .  INTRAOPERATIVE TRANSESOPHAGEAL ECHOCARDIOGRAM N/A 09/18/2013   Procedure: INTRAOPERATIVE TRANSESOPHAGEAL ECHOCARDIOGRAM;  Surgeon: Ivin Poot, MD;  Location: [place];  Service: Open Heart Surgery;  Laterality: N/A;  . KNEE ARTHROSCOPY     bilaterally  . KNEE ARTHROSCOPY W/ PARTIAL MEDIAL MENISCECTOMY  12/2002   left  . LEFT VENTRICULAR ASSIST DEVICE    . RIGHT HEART CATHETERIZATION N/A 08/25/2013   Procedure: RIGHT HEART CATH;  Surgeon: Jolaine Artist, MD;  Location: [facility] CATH LAB;  Service: Cardiovascular;  Laterality: N/A;  . RIGHT HEART CATHETERIZATION N/A 09/13/2013   Procedure: RIGHT HEART CATH;  Surgeon: Jolaine Artist, MD;  Location: [facility] C[facility] LAB;  Service: Cardiovascular;  Laterality: N/A;  . RIGHT HEART CATHETERIZATION N/A 02/08/2014   Procedure: RIGHT HEART CATH;  Surgeon: Jolaine Artist, MD;  Location[facility] C[facility] LAB;  Service: Cardiovascular;  Laterality: N/A;  . RIGHT HEART CATHETERIZATION N/A 02/12/2014   Procedure: RIGHT HEART CATH;  Surgeon: Jolaine Artist, MD;  Location: [facility] CATH LAB;  Service: Cardiovascular;  Laterality: N/A;  . TOTAL KNEE ARTHROPLASTY  11/27/11   left  . TOTAL KNEE ARTHROPLASTY  11/27/2011   Procedure: TOTAL KNEE ARTHROPLASTY;  Surgeon: Yvette Rack., MD;  Location: [place];  Service: Orthopedics;  Laterality: Left;  left total  knee arthroplasty  . VIDEO ASSISTED THORACOSCOPY Right 11/06/2013   Procedure: VIDEO ASSISTED THORACOSCOPY;  Surgeon: Gaye Pollack, MD;  Location: [facility] OR;  Service: Cardiothoracic;  Laterality: Right;    Family History  Problem Relation Age of Onset  . Heart failure Father   . Stroke Father   . Coronary artery disease Mother   . Heart disease Mother   . Hyperlipidemia Sister     Social History:  reports that he has never smoked. He has never used smokeless tobacco. He reports that he does not drink alcohol or use drugs.  Allergies:  Allergies  Allergen Reactions  . Ace Inhibitors     Hypotension and elevated creatinine  . Diovan [Valsartan] Other (See Comments)    Hypotension at low dose, hyperkalemia  . Lipitor [Atorvastatin Calcium] Other (See Comments)    Muscle pain  . Norvasc [Amlodipine] Other (See Comments)    Put patient to sleep   ROS:  Cardiomyopathy: He has been treated with a ventricle assist device and he is on Coumadin     Examination:   BP 90/62 (BP Location: Left Arm, Patient Position: Sitting, Cuff Size: Normal)   Pulse 81   Ht 5\' 7"  (1.702 m)   Wt 165 lb 3.2 oz (74.9 kg)   SpO2 97%   BMI 25.87 kg/m   Thyroid not palpable Biceps reflexes appear normal    Assessment/Plan:   Post ablative Hypothyroidism:   His levothyroxine dose has been fluctuating significantly Now has been put on Euthyrox brand for consistency Since he is doing well with 137 mcg both subjectively and with labs we will keep him on the same brand and prescription  However since his TSH is low normal considering his cardiac history and age we will have him take 6-1/2 tablets a week  Follow-up in 4 months   Patient Instructions  Take 1/2 Euthyrox on Sundays and 1 other days     Elayne Snare 01/31/2020, 3:49 PM

## 2020-01-31 NOTE — Patient Instructions (Signed)
Take 1/2 Euthyrox on Sundays and 1 other days

## 2020-02-08 ENCOUNTER — Other Ambulatory Visit (HOSPITAL_COMMUNITY): Payer: Self-pay | Admitting: *Deleted

## 2020-02-08 DIAGNOSIS — Z7901 Long term (current) use of anticoagulants: Secondary | ICD-10-CM

## 2020-02-08 DIAGNOSIS — I5022 Chronic systolic (congestive) heart failure: Secondary | ICD-10-CM

## 2020-02-08 DIAGNOSIS — Z95811 Presence of heart assist device: Secondary | ICD-10-CM

## 2020-02-12 ENCOUNTER — Other Ambulatory Visit (HOSPITAL_COMMUNITY): Payer: Self-pay | Admitting: *Deleted

## 2020-02-12 DIAGNOSIS — Z95811 Presence of heart assist device: Secondary | ICD-10-CM

## 2020-02-13 ENCOUNTER — Ambulatory Visit (HOSPITAL_COMMUNITY): Payer: Self-pay | Admitting: Pharmacist

## 2020-02-13 ENCOUNTER — Other Ambulatory Visit: Payer: Self-pay

## 2020-02-13 ENCOUNTER — Ambulatory Visit (HOSPITAL_COMMUNITY)
Admission: RE | Admit: 2020-02-13 | Discharge: 2020-02-13 | Disposition: A | Discharge status: Home / Self Care | Payer: Medicare Other | Source: Ambulatory Visit | Attending: Internal Medicine | Admitting: Internal Medicine

## 2020-02-13 VITALS — BP 101/73 | HR 70 | Wt 164.8 lb

## 2020-02-13 DIAGNOSIS — Z7982 Long term (current) use of aspirin: Secondary | ICD-10-CM | POA: Insufficient documentation

## 2020-02-13 DIAGNOSIS — I255 Ischemic cardiomyopathy: Secondary | ICD-10-CM | POA: Insufficient documentation

## 2020-02-13 DIAGNOSIS — Z8719 Personal history of other diseases of the digestive system: Secondary | ICD-10-CM | POA: Diagnosis not present

## 2020-02-13 DIAGNOSIS — I472 Ventricular tachycardia, unspecified: Secondary | ICD-10-CM

## 2020-02-13 DIAGNOSIS — I10 Essential (primary) hypertension: Secondary | ICD-10-CM

## 2020-02-13 DIAGNOSIS — R251 Tremor, unspecified: Secondary | ICD-10-CM | POA: Diagnosis not present

## 2020-02-13 DIAGNOSIS — Z7901 Long term (current) use of anticoagulants: Secondary | ICD-10-CM | POA: Insufficient documentation

## 2020-02-13 DIAGNOSIS — I2510 Atherosclerotic heart disease of native coronary artery without angina pectoris: Secondary | ICD-10-CM | POA: Insufficient documentation

## 2020-02-13 DIAGNOSIS — I130 Hypertensive heart and chronic kidney disease with heart failure and stage 1 through stage 4 chronic kidney disease, or unspecified chronic kidney disease: Secondary | ICD-10-CM | POA: Diagnosis not present

## 2020-02-13 DIAGNOSIS — F419 Anxiety disorder, unspecified: Secondary | ICD-10-CM | POA: Diagnosis not present

## 2020-02-13 DIAGNOSIS — M1990 Unspecified osteoarthritis, unspecified site: Secondary | ICD-10-CM | POA: Insufficient documentation

## 2020-02-13 DIAGNOSIS — E785 Hyperlipidemia, unspecified: Secondary | ICD-10-CM | POA: Diagnosis not present

## 2020-02-13 DIAGNOSIS — Z95810 Presence of automatic (implantable) cardiac defibrillator: Secondary | ICD-10-CM | POA: Diagnosis not present

## 2020-02-13 DIAGNOSIS — R748 Abnormal levels of other serum enzymes: Secondary | ICD-10-CM | POA: Insufficient documentation

## 2020-02-13 DIAGNOSIS — I480 Paroxysmal atrial fibrillation: Secondary | ICD-10-CM

## 2020-02-13 DIAGNOSIS — Z79899 Other long term (current) drug therapy: Secondary | ICD-10-CM | POA: Insufficient documentation

## 2020-02-13 DIAGNOSIS — N1832 Chronic kidney disease, stage 3b: Secondary | ICD-10-CM | POA: Insufficient documentation

## 2020-02-13 DIAGNOSIS — I252 Old myocardial infarction: Secondary | ICD-10-CM | POA: Diagnosis not present

## 2020-02-13 DIAGNOSIS — R2689 Other abnormalities of gait and mobility: Secondary | ICD-10-CM | POA: Insufficient documentation

## 2020-02-13 DIAGNOSIS — Z95811 Presence of heart assist device: Secondary | ICD-10-CM | POA: Diagnosis not present

## 2020-02-13 DIAGNOSIS — I5022 Chronic systolic (congestive) heart failure: Secondary | ICD-10-CM | POA: Insufficient documentation

## 2020-02-13 DIAGNOSIS — E7800 Pure hypercholesterolemia, unspecified: Secondary | ICD-10-CM | POA: Diagnosis not present

## 2020-02-13 DIAGNOSIS — N179 Acute kidney failure, unspecified: Secondary | ICD-10-CM | POA: Diagnosis not present

## 2020-02-13 DIAGNOSIS — Z96651 Presence of right artificial knee joint: Secondary | ICD-10-CM | POA: Insufficient documentation

## 2020-02-13 DIAGNOSIS — E039 Hypothyroidism, unspecified: Secondary | ICD-10-CM | POA: Diagnosis not present

## 2020-02-13 LAB — COMPREHENSIVE METABOLIC PANEL
ALT: 23 U/L (ref 0–44)
AST: 24 U/L (ref 15–41)
Albumin: 3.8 g/dL (ref 3.5–5.0)
Alkaline Phosphatase: 79 U/L (ref 38–126)
Anion gap: 8 (ref 5–15)
BUN: 25 mg/dL — ABNORMAL HIGH (ref 8–23)
CO2: 24 mmol/L (ref 22–32)
Calcium: 9 mg/dL (ref 8.9–10.3)
Chloride: 107 mmol/L (ref 98–111)
Creatinine, Ser: 1.86 mg/dL — ABNORMAL HIGH (ref 0.61–1.24)
GFR calc Af Amer: 40 mL/min — ABNORMAL LOW (ref >60)
GFR calc non Af Amer: 34 mL/min — ABNORMAL LOW (ref >60)
Glucose, Bld: 119 mg/dL — ABNORMAL HIGH (ref 70–99)
Potassium: 3.8 mmol/L (ref 3.5–5.1)
Sodium: 139 mmol/L (ref 135–145)
Total Bilirubin: 0.7 mg/dL (ref 0.3–1.2)
Total Protein: 6.7 g/dL (ref 6.5–8.1)

## 2020-02-13 LAB — PROTIME-INR
INR: 2.7 — ABNORMAL HIGH (ref 0.8–1.2)
Prothrombin Time: 27.9 seconds — ABNORMAL HIGH (ref 11.4–15.2)

## 2020-02-13 LAB — CBC
HCT: 42.8 % (ref 39.0–52.0)
Hemoglobin: 13.5 g/dL (ref 13.0–17.0)
MCH: 30.1 pg (ref 26.0–34.0)
MCHC: 31.5 g/dL (ref 30.0–36.0)
MCV: 95.5 fL (ref 80.0–100.0)
Platelets: 156 10*3/uL (ref 150–400)
RBC: 4.48 MIL/uL (ref 4.22–5.81)
RDW: 15.4 % (ref 11.5–15.5)
WBC: 5.2 10*3/uL (ref 4.0–10.5)
nRBC: 0 % (ref 0.0–0.2)

## 2020-02-13 LAB — LACTATE DEHYDROGENASE: LDH: 200 U/L — ABNORMAL HIGH (ref 98–192)

## 2020-02-13 LAB — PREALBUMIN: Prealbumin: 20.5 mg/dL (ref 18–38)

## 2020-02-13 NOTE — Patient Instructions (Signed)
1. No change in medications. 2. Coumadin dosing per Ander Purpura PharmD 3. Return to [facility] in 2 months

## 2020-02-13 NOTE — Progress Notes (Signed)
Patient presents for 2 month follow up visit in [facility] today. Reports no problems with VAD equipment or concerns with drive line.   Pt states he has been feeling great. Denies any increased SOB, abdominal distention, orthopnea, PND, pedal edema, palpitations, or ICD shocks. He says he has not needed any prn Lasix. Did take PRN Potassium for leg cramps after standing all day at the gun show.   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 This is not abnormal for him.   Reports right knee "bone on bone" pain. Manages with Tylenol and knee brace at home. Does not have knee brace with him today for 6 minute walk.   Vital Signs:  Doppler Pressure: 76 Automatc BP: 101/73 (83) HR: 70 SPO2: 97% on RA  Weight: 164.8 lb w/ eqt Last weight: 164.0 lb  VAD Indication: Destination therapy- patient choice    VAD interrogation & Equipment Management: Speed: 9200 Flow:  5.2 Power: 5.6w    PI: 5.9  Alarms: none Events: 15-30 PI events daily  Fixed speed 9200  Low speed limit: 8600  Primary Controller:  Replace back up battery in 7 months Back up controller:  Replace back up battery in  months  Annual Equipment Maintenance on UBC/PM was performed on 05/2019  I reviewed the LVAD parameters from today and compared the results to the patient's prior recorded data. LVAD interrogation was NEGATIVE for significant power changes, NEGATIVE for clinical alarms and STABLE for PI events/speed drops. No programming changes were made and pump is functioning within specified parameters. Pt is performing daily controller and system monitor self tests along with completing weekly and monthly maintenance for LVAD equipment.  Exit Site Care: Drive line is being maintained weekly by [name], his wife. Dressing dry and intact. Stabilization device present and accurately applied. Pt denies fever or chills. Pt has CHG allergy. Pt provided with 4 weekly dressing kits  and 5 anchors  Significant Events on VAD Support:  10/2013>SBO 12/2016> elevated LDH, admit for Bival  Device:Dual Medtronic Therapies:  V therapy on at 222 bpm; V monitor on 171         A therapy on 171 bpm (ramp and burst overdrive paing); A monitor on 300 bpm  Pacing: AAI<=>DDD 60 bpm  Last check: 09/28/19  BP & Labs:  Doppler 76 - Doppler is reflecting MAP  Hgb 13.5 - No S/S of bleeding. Specifically denies melena/BRBPR or nosebleeds.  LDH  and is within established baseline of 200- 350. Denies tea-colored urine. No power elevations noted on interrogation.   6.5 Intermacs follow up completed including:  Quality of Life, KCCQ-12, and Neurocognitive trail making.   Pt unable to complete 6 minute walk due to right knee pain; does not have brace with him.  Back up controller: Backup equipment not present today  [place]  KCCQ-12 02/13/2020  1 a. Ability to shower/bathe Slightly limited  1 b. Ability to walk 1 block Not at all limited  1 c. Ability to hurry/jog Slightly limited  2. Edema feet/ankles/legs Less than once a week  3. Limited by fatigue Never over the past 2 weeks  4. Limited by dyspnea Never over the past 2 weeks  5. Sitting up / on 3+ pillows Never over the past 2 weeks  6. Limited enjoyment of life Not limited at all  7. Rest of life w/ symptoms Completely satisfied  8 a. Participation in hobbies Slightly limited  8 b.  Participation in chores Did not limit at all  8 c. Visiting family/friends Did not limit at all    Patient Instructions:  1. No change in medications. 2. Coumadin dosing per Ander Purpura PharmD 3. Return to [facility] in 2 months   Emerson Monte RN [place] Coordinator  Office: [phone]  24/7 Pager: [phone]

## 2020-02-13 NOTE — Progress Notes (Signed)
VAD CLINIC NOTE   HPI:  Johnny Oneill is a 79 y.o. 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   Admitted 3/4-3/25/15 with SOB and syncopal episode. Found to have SBO and NG placed and started on TPN. On 11/06/13 developed progressive SOB and hypotension and co-ox 51% and Hgb 6.7. Started on milrinone and transfused. Echo showed nl RV function. Patient had PEA arrest and was intubated and started on pressors. CXR revealed a R hemothorax and a R CT placed. He required VATs and evacuation of hemothorax and was then teansferred to CIR.   LVAD Issues  Elevated LDH--bivalirudin. D/C LDH 188  Presented to ER on 06/27/19 with syncopal episode and head injury from fall. ECG showed VT 240. Underwent emergent DC-CV in ER. Head CT with no bleed. Was admitted and had IC gen replacement (EOL). Had small hematoma at site. Amio started. Post-discharge found to have episode of AFL on ICD remote check but has since resolved.    Patient presents for routine visit in [facility] today. Feels much better after amio stopped. No more tremors. Remains very active with church and other activities. Denies orthopnea or PND. No fevers, chills or problems with driveline. No bleeding, melena or neuro symptoms. No VAD alarms. No ICD shocks.  Taking all meds as prescribed.    VAD Indication: Destination therapy- patient choice    VAD interrogation & Equipment Management: Speed: 9200 Flow:  5.2 Power: 5.6w PI: 5.9  Alarms: none Events: 15-30 PI events daily  Fixed speed 9200  Low speed limit: 8600  Primary Controller: Replace back up battery in 7 months Back up controller: Replace back up battery in  months  Annual Equipment Maintenance on UBC/PM was performed on 05/2019  Exit Site Care: Drive line is being maintained weekly by [name], his wife. Dressing dry and intact. Stabilization device present and accurately  applied. Pt denies fever or chills. Pt has CHG allergy. Pt has sufficient dressing kits at home.     Past Medical History:  Diagnosis Date  . Anxiety   . Automatic implantable cardioverter-defibrillator in situ   . CAD (coronary artery disease)    S/P stenting of LCX in 2004;  s/p Lat MI 2009 with occlusion of the LCX - treated with Promus stenting. Has total occlusion of the RCA.   [name] CHF (congestive heart failure) ([place])   . Hypercholesteremia   . Hypertension   . Hypothyroidism   . ICD (implantable cardiac defibrillator) in place    PROPHYLACTIC      medtronic  . Inferior MI ([place]) "? date"; 2009  . Ischemic cardiomyopathy    a. 10/09 Echo: Sev LV Dysfxn, inf/lat AK, Mod MR.  . Liver disease   . LVAD (left ventricular assist device) present ([place])   . Osteoarthritis    a. s/p R TKR  . Pacemaker   . PVC (premature ventricular contraction)   . RBBB   . Shortness of breath     Current Outpatient Medications  Medication Sig Dispense Refill  . acetaminophen (TYLENOL) 500 MG chewable tablet Chew 500 mg by mouth every 6 (six) hours as needed for pain.    [name] aspirin EC 81 MG tablet Take 81 mg by mouth daily.    . cholecalciferol (VITAMIN D3) 25 MCG (1000 UT) tablet Take 1,000 Units by mouth daily.    . citalopram (CELEXA) 10 MG tablet Take 1 tablet  by mouth once daily 90 tablet 0  . Coenzyme Q10 (COQ10) 200 MG CAPS Take 200 mg by mouth daily.    . famotidine (PEPCID) 20 MG tablet Take 20 mg by mouth daily.     . hydrALAZINE (APRESOLINE) 50 MG tablet TAKE 1 TABLET BY MOUTH THREE TIMES DAILY 270 tablet 3  . levothyroxine (EUTHYROX) 137 MCG tablet Take 137 mcg by mouth daily before breakfast. Takes half tablet on Sunday, full tablet all other days    . Melatonin 3 MG TABS Take 1 tablet by mouth at bedtime.    . pantoprazole (PROTONIX) 40 MG tablet Take 1 tablet by mouth once daily 90 tablet 11  . potassium chloride SA (K-DUR,KLOR-CON) 20 MEQ tablet Take 1 tablet (20 mEq total) by mouth  daily as needed. Take one potassium pill when you take your lasix. 90 tablet 3  . Probiotic Product (PROBIOTIC PO) Take 1 capsule by mouth daily.    . rosuvastatin (CRESTOR) 20 MG tablet Take 1 tablet by mouth once daily 90 tablet 3  . spironolactone (ALDACTONE) 25 MG tablet Take 1 tablet by mouth once daily 90 tablet 3  . traZODone (DESYREL) 50 MG tablet Take 1 tablet (50 mg total) by mouth at bedtime. 30 tablet 5  . warfarin (COUMADIN) 5 MG tablet Take warfarin 7.5 mg (1.5 tab) Monday/Friday and 5 mg (1 tab) all other days or as directed by HF clinic 60 tablet 11  . furosemide (LASIX) 20 MG tablet Take 20 mg by mouth daily as needed (Take 20 mg as daily as needed for wt > 153 lbs). Reported on 01/14/2016 (Patient not taking: Reported on 02/13/2020)     No current facility-administered medications for this encounter.   Facility-Administered Medications Ordered in Other Encounters  Medication Dose Route Frequency Provider Last Rate Last Admin  . etomidate (AMIDATE) injection    Anesthesia Intra-op Myna Bright, CRNA   20 mg at 02/08/14 0915  . rocuronium [facility]) injection    Anesthesia Intra-op Myna Bright, CRNA   50 mg at 02/08/14 0932  . succinylcholine (ANECTINE) injection    Anesthesia Intra-op Myna Bright, CRNA   100 mg at 02/08/14 0915   Ace inhibitors, Diovan [valsartan], Lipitor [atorvastatin calcium], and Norvasc [amlodipine]  Review of systems complete and found to be negative unless listed in HPI.    Vital Signs:  Doppler Pressure: 76 Automatc BP: 101/73 (83) HR: 70 SPO2: 97% on RA  Weight: 164.8 lb w/ eqt Last weight: 164.0 lb   Physical Exam: General:  NAD.  HEENT: normal  Neck: supple. JVP not elevated.  Carotids 2+ bilat; no bruits. No lymphadenopathy or thryomegaly appreciated. Cor: LVAD hum.  Lungs: Clear. Abdomen: soft, nontender, non-distended. No hepatosplenomegaly. No bruits or masses. Good bowel sounds. Driveline site clean. Anchor in  place.  Extremities: no cyanosis, clubbing, rash. Warm no edema  Neuro: alert & oriented x 3. No focal deficits. Moves all 4 without problem    ASSESSMENT AND PLAN:   1) Tremors & imbalance - resolved with stopping amio - no recurrence  2) Chronic systolic HF: ICM, EF 71-69% s/p LVAD HM II implant 08/2013 for DT.   - Echo 3/18 EF 10-15% mild AI. Mod RV dysfunction. LVAD cannula OK - Stable NYHA I with vad support - Volume status looks good - Continue spiro 25 mg daily.  - He has not tolerated ACE-I or amlodipine or b-blocker in the past.  3) LVAD: HMII 08/2013 for DT. - VAD  interrogated personally. Parameters stable. - Admitted in May 2018 with elevated LDH with bival.  - LDH 391 -> 291 -> 200 - Continue aspirin and coumadin.  - Hgb 13.5 stable - Driveline site looks good - MAPs well controlled  4) VT - stable. No further events. Suspect possible suction event as trigger - now s/p ICD gen change. Site improved - Off amio due to cerebellar side effects. Follows with EP. If having more VT will need to consider mexilitene - Now > 6 mo nths since VT (10/20). Can resume driving - Keep K> 4.0 Mg > 2.0- - Follows with Dr. Curt Bears   5) Chronic anticoagulation: INR goal 2.0-3.0.    - INR 2.7 Discussed dosing with PharmD personally. - Continue ASA 81 mg for LVAD + warfarin.   6) HTN:  - Blood pressure well controlled. Continue current regimen.  7) PAFL - Remains in NSR. On warfarin   8) Hypothyroidism:  - Followed by Dr Dwyane Dee. Stable.No change   9) Hyperlipidemia:  - Continue Crestor. Zetia stopped due to cost.  - No change.   8) CAD - no s/s of ischemia  9) AKI on CKD IIIb - stable 1.86 today - contin ue to follow Encouraged him to stay hydrated   10) Elevated liver enzymes - improving off amio. Liver u/s ok  Total time spent 35 minutes. Over half that time spent discussing above.    Glori Bickers, MD  10:24 PM

## 2020-02-13 NOTE — Progress Notes (Signed)
LVAD INR 

## 2020-02-14 ENCOUNTER — Other Ambulatory Visit: Payer: Self-pay | Admitting: Endocrinology

## 2020-02-15 ENCOUNTER — Other Ambulatory Visit: Payer: Self-pay

## 2020-02-15 DIAGNOSIS — E890 Postprocedural hypothyroidism: Secondary | ICD-10-CM

## 2020-02-15 MED ORDER — LEVOTHYROXINE SODIUM 137 MCG PO TABS: ORAL_TABLET | ORAL | 2 refills | Status: DC

## 2020-03-01 ENCOUNTER — Other Ambulatory Visit (HOSPITAL_COMMUNITY): Payer: Self-pay | Admitting: *Deleted

## 2020-03-01 DIAGNOSIS — Z7901 Long term (current) use of anticoagulants: Secondary | ICD-10-CM

## 2020-03-01 DIAGNOSIS — Z95811 Presence of heart assist device: Secondary | ICD-10-CM

## 2020-03-05 ENCOUNTER — Ambulatory Visit (HOSPITAL_COMMUNITY): Payer: Self-pay | Admitting: Pharmacist

## 2020-03-05 ENCOUNTER — Ambulatory Visit (HOSPITAL_COMMUNITY)
Admission: RE | Admit: 2020-03-05 | Discharge: 2020-03-05 | Disposition: A | Discharge status: Home / Self Care | Payer: Medicare Other | Source: Ambulatory Visit | Attending: Cardiology | Admitting: Cardiology

## 2020-03-05 ENCOUNTER — Other Ambulatory Visit: Payer: Self-pay

## 2020-03-05 DIAGNOSIS — Z95811 Presence of heart assist device: Secondary | ICD-10-CM | POA: Diagnosis not present

## 2020-03-05 DIAGNOSIS — Z7901 Long term (current) use of anticoagulants: Secondary | ICD-10-CM

## 2020-03-05 LAB — PROTIME-INR
INR: 2.3 — ABNORMAL HIGH (ref 0.8–1.2)
Prothrombin Time: 24.7 seconds — ABNORMAL HIGH (ref 11.4–15.2)

## 2020-03-05 LAB — IRON AND TIBC
Iron: 80 ug/dL (ref 45–182)
Saturation Ratios: 25 % (ref 17.9–39.5)
TIBC: 325 ug/dL (ref 250–450)
UIBC: 245 ug/dL

## 2020-03-05 LAB — FOLATE: Folate: 14 ng/mL (ref >5.9)

## 2020-03-05 LAB — LACTATE DEHYDROGENASE: LDH: 220 U/L — ABNORMAL HIGH (ref 98–192)

## 2020-03-05 LAB — FERRITIN: Ferritin: 97 ng/mL (ref 24–336)

## 2020-03-05 LAB — VITAMIN B12: Vitamin B-12: 169 pg/mL — ABNORMAL LOW (ref 180–914)

## 2020-03-05 NOTE — Progress Notes (Signed)
LVAD INR 

## 2020-03-11 ENCOUNTER — Other Ambulatory Visit (HOSPITAL_COMMUNITY): Payer: Self-pay | Admitting: Internal Medicine

## 2020-03-19 ENCOUNTER — Other Ambulatory Visit (HOSPITAL_COMMUNITY): Payer: Self-pay | Admitting: Internal Medicine

## 2020-03-19 DIAGNOSIS — I255 Ischemic cardiomyopathy: Secondary | ICD-10-CM

## 2020-03-22 ENCOUNTER — Other Ambulatory Visit (HOSPITAL_COMMUNITY): Payer: Self-pay | Admitting: Unknown Physician Specialty

## 2020-03-22 DIAGNOSIS — Z95811 Presence of heart assist device: Secondary | ICD-10-CM

## 2020-03-22 DIAGNOSIS — Z7901 Long term (current) use of anticoagulants: Secondary | ICD-10-CM

## 2020-03-26 ENCOUNTER — Ambulatory Visit (HOSPITAL_COMMUNITY): Payer: Self-pay | Admitting: Pharmacist

## 2020-03-26 ENCOUNTER — Ambulatory Visit (HOSPITAL_COMMUNITY)
Admission: RE | Admit: 2020-03-26 | Discharge: 2020-03-26 | Disposition: A | Discharge status: Home / Self Care | Payer: Medicare Other | Source: Ambulatory Visit | Attending: Cardiology | Admitting: Cardiology

## 2020-03-26 ENCOUNTER — Other Ambulatory Visit: Payer: Self-pay

## 2020-03-26 DIAGNOSIS — Z95811 Presence of heart assist device: Secondary | ICD-10-CM | POA: Diagnosis not present

## 2020-03-26 DIAGNOSIS — Z7901 Long term (current) use of anticoagulants: Secondary | ICD-10-CM

## 2020-03-26 LAB — PROTIME-INR
INR: 2 — ABNORMAL HIGH (ref 0.8–1.2)
Prothrombin Time: 21.6 seconds — ABNORMAL HIGH (ref 11.4–15.2)

## 2020-03-26 LAB — LACTATE DEHYDROGENASE: LDH: 235 U/L — ABNORMAL HIGH (ref 98–192)

## 2020-03-26 NOTE — Progress Notes (Signed)
LVAD INR 

## 2020-03-28 ENCOUNTER — Ambulatory Visit (INDEPENDENT_AMBULATORY_CARE_PROVIDER_SITE_OTHER): Payer: Medicare Other | Admitting: *Deleted

## 2020-03-28 DIAGNOSIS — I5022 Chronic systolic (congestive) heart failure: Secondary | ICD-10-CM

## 2020-03-28 LAB — CUP PACEART REMOTE DEVICE CHECK
Battery Remaining Longevity: 103 mo
Battery Voltage: 3.03 V
Brady Statistic AP VP Percent: 17.35 %
Brady Statistic AP VS Percent: 26.82 %
Brady Statistic AS VP Percent: 36.28 %
Brady Statistic AS VS Percent: 19.55 %
Brady Statistic RA Percent Paced: 43.34 %
Brady Statistic RV Percent Paced: 52.8 %
Date Time Interrogation Session: 20210729052703
HighPow Impedance: 39 Ohm
HighPow Impedance: 47 Ohm
Implantable Lead Implant Date: 20091103
Implantable Lead Implant Date: 20091103
Implantable Lead Location: 753859
Implantable Lead Location: 753860
Implantable Lead Model: 5076
Implantable Lead Model: 6947
Implantable Pulse Generator Implant Date: 20201028
Lead Channel Impedance Value: 342 Ohm
Lead Channel Impedance Value: 399 Ohm
Lead Channel Impedance Value: 418 Ohm
Lead Channel Pacing Threshold Amplitude: 0.5 V
Lead Channel Pacing Threshold Amplitude: 1.5 V
Lead Channel Pacing Threshold Pulse Width: 0.4 ms
Lead Channel Pacing Threshold Pulse Width: 0.4 ms
Lead Channel Sensing Intrinsic Amplitude: 2.5 mV
Lead Channel Sensing Intrinsic Amplitude: 2.5 mV
Lead Channel Sensing Intrinsic Amplitude: 7.375 mV
Lead Channel Sensing Intrinsic Amplitude: 7.375 mV
Lead Channel Setting Pacing Amplitude: 1.5 V
Lead Channel Setting Pacing Amplitude: 2.75 V
Lead Channel Setting Pacing Pulse Width: 0.6 ms
Lead Channel Setting Sensing Sensitivity: 0.3 mV

## 2020-04-01 NOTE — Progress Notes (Signed)
Remote ICD transmission.   

## 2020-04-04 DIAGNOSIS — M25561 Pain in right knee: Secondary | ICD-10-CM | POA: Diagnosis not present

## 2020-04-09 DIAGNOSIS — H1789 Other corneal scars and opacities: Secondary | ICD-10-CM | POA: Diagnosis not present

## 2020-04-16 ENCOUNTER — Encounter (HOSPITAL_COMMUNITY): Payer: Medicare Other

## 2020-04-26 ENCOUNTER — Other Ambulatory Visit (HOSPITAL_COMMUNITY): Payer: Self-pay | Admitting: *Deleted

## 2020-04-26 DIAGNOSIS — I5022 Chronic systolic (congestive) heart failure: Secondary | ICD-10-CM

## 2020-04-26 DIAGNOSIS — Z95811 Presence of heart assist device: Secondary | ICD-10-CM

## 2020-04-26 DIAGNOSIS — Z7901 Long term (current) use of anticoagulants: Secondary | ICD-10-CM

## 2020-04-30 ENCOUNTER — Other Ambulatory Visit (HOSPITAL_COMMUNITY): Payer: Self-pay | Admitting: Unknown Physician Specialty

## 2020-04-30 ENCOUNTER — Other Ambulatory Visit: Payer: Self-pay

## 2020-04-30 ENCOUNTER — Ambulatory Visit (HOSPITAL_COMMUNITY): Payer: Self-pay | Admitting: Pharmacist

## 2020-04-30 ENCOUNTER — Ambulatory Visit (HOSPITAL_COMMUNITY)
Admission: RE | Admit: 2020-04-30 | Discharge: 2020-04-30 | Disposition: A | Discharge status: Home / Self Care | Payer: Medicare Other | Source: Ambulatory Visit | Attending: Internal Medicine | Admitting: Internal Medicine

## 2020-04-30 ENCOUNTER — Other Ambulatory Visit (HOSPITAL_COMMUNITY): Payer: Self-pay | Admitting: Internal Medicine

## 2020-04-30 VITALS — BP 98/0 | HR 70 | Wt 164.8 lb

## 2020-04-30 DIAGNOSIS — Z7982 Long term (current) use of aspirin: Secondary | ICD-10-CM | POA: Insufficient documentation

## 2020-04-30 DIAGNOSIS — E785 Hyperlipidemia, unspecified: Secondary | ICD-10-CM | POA: Insufficient documentation

## 2020-04-30 DIAGNOSIS — I5022 Chronic systolic (congestive) heart failure: Secondary | ICD-10-CM

## 2020-04-30 DIAGNOSIS — N1832 Chronic kidney disease, stage 3b: Secondary | ICD-10-CM | POA: Diagnosis not present

## 2020-04-30 DIAGNOSIS — Z95810 Presence of automatic (implantable) cardiac defibrillator: Secondary | ICD-10-CM | POA: Diagnosis not present

## 2020-04-30 DIAGNOSIS — E7800 Pure hypercholesterolemia, unspecified: Secondary | ICD-10-CM | POA: Insufficient documentation

## 2020-04-30 DIAGNOSIS — M1990 Unspecified osteoarthritis, unspecified site: Secondary | ICD-10-CM | POA: Diagnosis not present

## 2020-04-30 DIAGNOSIS — I472 Ventricular tachycardia, unspecified: Secondary | ICD-10-CM

## 2020-04-30 DIAGNOSIS — Z7901 Long term (current) use of anticoagulants: Secondary | ICD-10-CM | POA: Insufficient documentation

## 2020-04-30 DIAGNOSIS — Z79899 Other long term (current) drug therapy: Secondary | ICD-10-CM | POA: Insufficient documentation

## 2020-04-30 DIAGNOSIS — I255 Ischemic cardiomyopathy: Secondary | ICD-10-CM | POA: Diagnosis not present

## 2020-04-30 DIAGNOSIS — E039 Hypothyroidism, unspecified: Secondary | ICD-10-CM | POA: Diagnosis not present

## 2020-04-30 DIAGNOSIS — I252 Old myocardial infarction: Secondary | ICD-10-CM | POA: Diagnosis not present

## 2020-04-30 DIAGNOSIS — I2510 Atherosclerotic heart disease of native coronary artery without angina pectoris: Secondary | ICD-10-CM | POA: Insufficient documentation

## 2020-04-30 DIAGNOSIS — F419 Anxiety disorder, unspecified: Secondary | ICD-10-CM | POA: Insufficient documentation

## 2020-04-30 DIAGNOSIS — Z95811 Presence of heart assist device: Secondary | ICD-10-CM

## 2020-04-30 DIAGNOSIS — Z79890 Hormone replacement therapy (postmenopausal): Secondary | ICD-10-CM | POA: Insufficient documentation

## 2020-04-30 DIAGNOSIS — I130 Hypertensive heart and chronic kidney disease with heart failure and stage 1 through stage 4 chronic kidney disease, or unspecified chronic kidney disease: Secondary | ICD-10-CM | POA: Insufficient documentation

## 2020-04-30 LAB — COMPREHENSIVE METABOLIC PANEL
ALT: 16 U/L (ref 0–44)
AST: 24 U/L (ref 15–41)
Albumin: 3.9 g/dL (ref 3.5–5.0)
Alkaline Phosphatase: 87 U/L (ref 38–126)
Anion gap: 10 (ref 5–15)
BUN: 24 mg/dL — ABNORMAL HIGH (ref 8–23)
CO2: 21 mmol/L — ABNORMAL LOW (ref 22–32)
Calcium: 9.2 mg/dL (ref 8.9–10.3)
Chloride: 106 mmol/L (ref 98–111)
Creatinine, Ser: 1.65 mg/dL — ABNORMAL HIGH (ref 0.61–1.24)
GFR calc Af Amer: 46 mL/min — ABNORMAL LOW (ref >60)
GFR calc non Af Amer: 40 mL/min — ABNORMAL LOW (ref >60)
Glucose, Bld: 97 mg/dL (ref 70–99)
Potassium: 4.1 mmol/L (ref 3.5–5.1)
Sodium: 137 mmol/L (ref 135–145)
Total Bilirubin: 0.9 mg/dL (ref 0.3–1.2)
Total Protein: 6.9 g/dL (ref 6.5–8.1)

## 2020-04-30 LAB — PROTIME-INR
INR: 1.9 — ABNORMAL HIGH (ref 0.8–1.2)
Prothrombin Time: 20.9 seconds — ABNORMAL HIGH (ref 11.4–15.2)

## 2020-04-30 LAB — CBC
HCT: 42.9 % (ref 39.0–52.0)
Hemoglobin: 13.9 g/dL (ref 13.0–17.0)
MCH: 30.7 pg (ref 26.0–34.0)
MCHC: 32.4 g/dL (ref 30.0–36.0)
MCV: 94.7 fL (ref 80.0–100.0)
Platelets: 156 10*3/uL (ref 150–400)
RBC: 4.53 MIL/uL (ref 4.22–5.81)
RDW: 14.6 % (ref 11.5–15.5)
WBC: 5.6 10*3/uL (ref 4.0–10.5)
nRBC: 0 % (ref 0.0–0.2)

## 2020-04-30 LAB — LACTATE DEHYDROGENASE: LDH: 262 U/L — ABNORMAL HIGH (ref 98–192)

## 2020-04-30 MED ORDER — EMPAGLIFLOZIN 10 MG PO TABS
10.0000 mg | ORAL_TABLET | Freq: Every day | ORAL | 6 refills | Status: DC
Start: 2020-04-30 — End: 2020-05-09

## 2020-04-30 MED ORDER — WARFARIN SODIUM 5 MG PO TABS: ORAL_TABLET | ORAL | 6 refills | Status: DC

## 2020-04-30 MED ORDER — DAPAGLIFLOZIN PROPANEDIOL 10 MG PO TABS
10.0000 mg | ORAL_TABLET | Freq: Every day | ORAL | 6 refills | Status: DC
Start: 2020-04-30 — End: 2020-04-30

## 2020-04-30 NOTE — Patient Instructions (Signed)
1. Stop Spironolactone 2. Start Farxiga 10 mg daily 3. Return to clinic in 1 week for a nurse visit w/ BP check, VAD interrogation and BMET 4. Return to clinic in 2 months for a visit w/Dr. Linna Hoff

## 2020-04-30 NOTE — Progress Notes (Signed)
Patient presents for 2 month follow up visit in [facility] today. Reports no problems with VAD equipment or concerns with drive line.   Pt states he has been feeling great. Denies any increased SOB, abdominal distention, orthopnea, PND, pedal edema, palpitations, or ICD shocks. He says he has not needed any prn Lasix. Did take PRN Potassium for leg cramps after standing all day at the gun show.   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 This is not abnormal for him.   Samples of Wilder Glade were given to the patient, quantity 2, Lot Number [id number] - 2 week supply.  Vital Signs:  Doppler Pressure: 98 Automatc BP: 105/83 (92) HR: 70 SPO2: 97% on RA  Weight: 164.8 lb w/ eqt Last weight: 164.8 lb  VAD Indication: Destination therapy- patient choice    VAD interrogation & Equipment Management: Speed: 9200 Flow:  4.8 Power: 5.3w    PI: 7.1  Alarms: none Events: 5-10 PI events daily  Fixed speed 9200  Low speed limit: 8600  Primary Controller:  Expiring...replaced with [id number]. Replace back up battery in 30 months Back up controller:  Replace back up battery in  months  Annual Equipment Maintenance on UBC/PM was performed on 05/2019  I reviewed the LVAD parameters from today and compared the results to the patient's prior recorded data. LVAD interrogation was NEGATIVE for significant power changes, NEGATIVE for clinical alarms and STABLE for PI events/speed drops. No programming changes were made and pump is functioning within specified parameters. Pt is performing daily controller and system monitor self tests along with completing weekly and monthly maintenance for LVAD equipment.  Exit Site Care: Drive line is being maintained weekly by [name], his wife. Dressing dry and intact. Stabilization device present and accurately applied. Pt denies fever or chills. Pt has CHG allergy. Pt provided with 8 weekly dressing kits   Significant  Events on VAD Support:  10/2013>SBO 12/2016> elevated LDH, admit for Bival  Device:Dual Medtronic Therapies:  V therapy on at 222 bpm; V monitor on 171         A therapy on 171 bpm (ramp and burst overdrive paing); A monitor on 300 bpm  Pacing: AAI<=>DDD 60 bpm  Last check: 09/28/19  BP & Labs:  Doppler 98 - Doppler is reflecting MAP  Hgb 13.9 - No S/S of bleeding. Specifically denies melena/BRBPR or nosebleeds.  LDH 262 and is within established baseline of 200- 350. Denies tea-colored urine. No power elevations noted on interrogation.    Patient Instructions:  1. Stop Spironolactone 2. Start Farxiga 10 mg daily 3. Return to clinic in 1 week for a nurse visit w/ BP check, VAD interrogation and BMET 4. Return to clinic in 2 months for a visit w/Dr. Loney Laurence RN [place] Coordinator  Office: [phone]  24/7 Pager: [phone]

## 2020-04-30 NOTE — Addendum Note (Signed)
Encounter addended by: Christinia Gully, RN on: 04/30/2020 4:05 PM  Actions taken: Pharmacy for encounter modified, Order list changed, Clinical Note Signed

## 2020-04-30 NOTE — Progress Notes (Signed)
LVAD INR 

## 2020-04-30 NOTE — Progress Notes (Signed)
VAD CLINIC NOTE   HPI:  Johnny Oneill is a 79 y.o. 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   Admitted 3/4-3/25/15 with SOB and syncopal episode. Found to have SBO and NG placed and started on TPN. On 11/06/13 developed progressive SOB and hypotension and co-ox 51% and Hgb 6.7. Started on milrinone and transfused. Echo showed nl RV function. Patient had PEA arrest and was intubated and started on pressors. CXR revealed a R hemothorax and a R CT placed. He required VATs and evacuation of hemothorax and was then teansferred to CIR.   LVAD Issues  Elevated LDH--bivalirudin. D/C LDH 188  Presented to ER on 06/27/19 with syncopal episode and head injury from fall. ECG showed VT 240. Underwent emergent DC-CV in ER. Head CT with no bleed. Was admitted and had IC gen replacement (EOL). Had small hematoma at site. Amio started. Post-discharge found to have episode of AFL on ICD remote check but has since resolved.    Patient presents for routine visit in [facility] today. Did not tolerate amio well due to side effects. Doing ok. Main issue is knee pain with "bone-on-bone" arthritis. Denies orthopnea or PND. No fevers, chills or problems with driveline. No bleeding, melena or neuro symptoms. No VAD alarms. Taking all meds as prescribed.    VAD Indication: Destination therapy- patient choice    VAD interrogation & Equipment Management: Speed: 9200 Flow:  5.2 Power: 5.6w PI: 5.9  Alarms: none Events: 15-30 PI events daily  Fixed speed 9200  Low speed limit: 8600  Primary Controller: Replace back up battery in 7 months Back up controller: Replace back up battery in  months  Annual Equipment Maintenance on UBC/PM was performed on 05/2019   Exit Site Care: Drive line is being maintained weekly by Johnny Oneill, his wife. Dressing dry and intact. Stabilization device present and accurately applied. Pt  denies fever or chills. Pt has CHG allergy. Pt has sufficient dressing kits at home.     Past Medical History:  Diagnosis Date  . Anxiety   . Automatic implantable cardioverter-defibrillator in situ   . CAD (coronary artery disease)    S/P stenting of LCX in 2004;  s/p Lat MI 2009 with occlusion of the LCX - treated with Promus stenting. Has total occlusion of the RCA.   [name] CHF (congestive heart failure) ([place])   . Hypercholesteremia   . Hypertension   . Hypothyroidism   . ICD (implantable cardiac defibrillator) in place    PROPHYLACTIC      medtronic  . Inferior MI ([place]) "? date"; 2009  . Ischemic cardiomyopathy    a. 10/09 Echo: Sev LV Dysfxn, inf/lat AK, Mod MR.  . Liver disease   . LVAD (left ventricular assist device) present ([place])   . Osteoarthritis    a. s/p R TKR  . Pacemaker   . PVC (premature ventricular contraction)   . RBBB   . Shortness of breath     Current Outpatient Medications  Medication Sig Dispense Refill  . acetaminophen (TYLENOL) 500 MG chewable tablet Chew 500 mg by mouth every 6 (six) hours as needed for pain.    [name] aspirin EC 81 MG tablet Take 81 mg by mouth daily.    . cholecalciferol (VITAMIN D3) 25 MCG (1000 UT) tablet Take 1,000 Units by mouth daily.    . citalopram (CELEXA) 10 MG tablet Take 1 tablet by  mouth once daily 90 tablet 3  . Coenzyme Q10 (COQ10) 200 MG CAPS Take 200 mg by mouth daily.    . famotidine (PEPCID) 20 MG tablet Take 20 mg by mouth daily.     . hydrALAZINE (APRESOLINE) 50 MG tablet TAKE 1 TABLET BY MOUTH THREE TIMES DAILY 270 tablet 3  . levothyroxine (EUTHYROX) 137 MCG tablet Takes half tablet on Sunday, full tablet all other days 30 tablet 2  . Melatonin 3 MG TABS Take 1 tablet by mouth at bedtime.    . pantoprazole (PROTONIX) 40 MG tablet Take 1 tablet by mouth once daily 90 tablet 11  . Probiotic Product (PROBIOTIC PO) Take 1 capsule by mouth daily.    . rosuvastatin (CRESTOR) 20 MG tablet Take 1 tablet by mouth once daily  90 tablet 3  . spironolactone (ALDACTONE) 25 MG tablet Take 1 tablet by mouth once daily 90 tablet 3  . traZODone (DESYREL) 50 MG tablet Take 1 tablet (50 mg total) by mouth at bedtime. 30 tablet 5  . warfarin (COUMADIN) 5 MG tablet TAKE 2 TABLETS BY MOUTH ONCE DAILY, TAKE 1 TABLET BY MOUTH ON MONDAY AND WEDNESDAY (Patient taking differently: TAKE 1 TABLETS BY MOUTH ONCE DAILY, TAKE 1.5 TABLET BY MOUTH ON MONDAY AND FRIDAY) 60 tablet 0  . furosemide (LASIX) 20 MG tablet Take 20 mg by mouth daily as needed (Take 20 mg as daily as needed for wt > 153 lbs). Reported on 01/14/2016 (Patient not taking: Reported on 02/13/2020)    . potassium chloride SA (K-DUR,KLOR-CON) 20 MEQ tablet Take 1 tablet (20 mEq total) by mouth daily as needed. Take one potassium pill when you take your lasix. (Patient not taking: Reported on 04/30/2020) 90 tablet 3   No current facility-administered medications for this encounter.   Facility-Administered Medications Ordered in Other Encounters  Medication Dose Route Frequency Provider Last Rate Last Admin  . etomidate (AMIDATE) injection    Anesthesia Intra-op Johnny Bright, CRNA   20 mg at 02/08/14 0915  . rocuronium [facility]) injection    Anesthesia Intra-op Johnny Bright, CRNA   50 mg at 02/08/14 0932  . succinylcholine (ANECTINE) injection    Anesthesia Intra-op Johnny Bright, CRNA   100 mg at 02/08/14 0915   Ace inhibitors, Diovan [valsartan], Lipitor [atorvastatin calcium], and Norvasc [amlodipine]  Review of systems complete and found to be negative unless listed in HPI.     Vital Signs:  Doppler Pressure: 76 Automatc BP: 101/73 (83) HR: 70 SPO2: 97% on RA  Weight: 164.8 lb w/ eqt Last weight: 164.0 lb   Physical Exam: General:  NAD.  HEENT: normal  Neck: supple. JVP not elevated.  Carotids 2+ bilat; no bruits. No lymphadenopathy or thryomegaly appreciated. Cor: LVAD hum.  Lungs: Clear. Abdomen: soft, nontender, non-distended. No  hepatosplenomegaly. No bruits or masses. Good bowel sounds. Driveline site clean. Anchor in place.  Extremities: no cyanosis, clubbing, rash. Warm no edema  Neuro: alert & oriented x 3. No focal deficits. Moves all 4 without problem    ASSESSMENT AND PLAN:   1) Chronic systolic HF: ICM, EF 70-26% s/p LVAD HM II implant 08/2013 for DT.   - Echo 3/18 EF 10-15% mild AI. Mod RV dysfunction. LVAD cannula OK - Stable NYHA I with VAD support though limited by arthritis - Volume status looks good - Given CKD 3b will switch spiro to Farxiga 10 - He has not tolerated ACE-I or amlodipine or b-blocker in the past.  2)  LVAD: HMII 08/2013 for DT. - VAD interrogated personally. Parameters stable. - Admitted in May 2018 with elevated LDH with bival.  - LDH 391 -> 291 -> 200 -> 262 - Continue aspirin and coumadin.  - Hgb 13.9  stable - DL site looks good - MAPs ok  3) VT - stable. No further events. Suspect possible suction event as trigger - now s/p ICD gen change.  - Off amio due to cerebellar side effects. Follows with EP. If having more VT will need to consider mexilitene - Now > 6 months since VT (10/20). - Keep K> 4.0 Mg > 2.0 - Follows with Dr. Curt Bears  4) Chronic anticoagulation: INR goal 2.0-3.0.    - INR 1.9 Discussed dosing with PharmD personally. - Continue ASA 81 mg for LVAD + warfarin.   5) HTN:  - MAPs good  6) PAFL - Remains in NSR. On warfarin   7) Hypothyroidism:  - Followed by Dr Dwyane Dee. Stable.No change   8) Hyperlipidemia:  - Continue Crestor. Zetia stopped due to cost.  - No change.   9) CAD - no s/s of ischemia of ischemia  10) CKD IIIb - improved to 1.65 today - Given CKD 3b will switch spiro to Farxiga 10  11) Elevated liver enzymes - normalized off amio. Liver u/s ok  Total time spent 35 minutes. Over half that time spent discussing above.    Glori Bickers, MD  9:29 AM

## 2020-04-30 NOTE — Progress Notes (Signed)
Will change pt to Jardiance 10 mg daily from [place] due to pts insurance coverage. Called pt at home and discussed change. Wilder Glade d/c'd and script for [place] sent to [facility].  Tanda Rockers RN, BSN VAD Coordinator 24/7 Pager [phone]

## 2020-05-02 ENCOUNTER — Other Ambulatory Visit (HOSPITAL_COMMUNITY): Payer: Self-pay | Admitting: *Deleted

## 2020-05-02 DIAGNOSIS — Z95811 Presence of heart assist device: Secondary | ICD-10-CM

## 2020-05-02 DIAGNOSIS — I5022 Chronic systolic (congestive) heart failure: Secondary | ICD-10-CM

## 2020-05-02 DIAGNOSIS — Z7901 Long term (current) use of anticoagulants: Secondary | ICD-10-CM

## 2020-05-07 ENCOUNTER — Ambulatory Visit (HOSPITAL_COMMUNITY): Payer: Self-pay | Admitting: Pharmacist

## 2020-05-07 ENCOUNTER — Other Ambulatory Visit (HOSPITAL_COMMUNITY): Payer: Self-pay | Admitting: *Deleted

## 2020-05-07 ENCOUNTER — Other Ambulatory Visit: Payer: Self-pay

## 2020-05-07 ENCOUNTER — Ambulatory Visit (HOSPITAL_COMMUNITY)
Admission: RE | Admit: 2020-05-07 | Discharge: 2020-05-07 | Disposition: A | Discharge status: Home / Self Care | Payer: Medicare Other | Source: Ambulatory Visit | Attending: Internal Medicine | Admitting: Internal Medicine

## 2020-05-07 DIAGNOSIS — Z95811 Presence of heart assist device: Secondary | ICD-10-CM

## 2020-05-07 DIAGNOSIS — I5022 Chronic systolic (congestive) heart failure: Secondary | ICD-10-CM

## 2020-05-07 DIAGNOSIS — Z7901 Long term (current) use of anticoagulants: Secondary | ICD-10-CM

## 2020-05-07 LAB — PROTIME-INR
INR: 1.3 — ABNORMAL HIGH (ref 0.8–1.2)
Prothrombin Time: 15.9 seconds — ABNORMAL HIGH (ref 11.4–15.2)

## 2020-05-07 LAB — BASIC METABOLIC PANEL
Anion gap: 8 (ref 5–15)
BUN: 26 mg/dL — ABNORMAL HIGH (ref 8–23)
CO2: 24 mmol/L (ref 22–32)
Calcium: 9.1 mg/dL (ref 8.9–10.3)
Chloride: 106 mmol/L (ref 98–111)
Creatinine, Ser: 1.94 mg/dL — ABNORMAL HIGH (ref 0.61–1.24)
GFR calc Af Amer: 38 mL/min — ABNORMAL LOW (ref >60)
GFR calc non Af Amer: 33 mL/min — ABNORMAL LOW (ref >60)
Glucose, Bld: 99 mg/dL (ref 70–99)
Potassium: 3.8 mmol/L (ref 3.5–5.1)
Sodium: 138 mmol/L (ref 135–145)

## 2020-05-07 LAB — LACTATE DEHYDROGENASE: LDH: 236 U/L — ABNORMAL HIGH (ref 98–192)

## 2020-05-07 MED ORDER — ENOXAPARIN SODIUM 40 MG/0.4ML ~~LOC~~ SOLN
40.0000 mg | Freq: Two times a day (BID) | SUBCUTANEOUS | 0 refills | Status: DC
Start: 2020-05-07 — End: 2020-06-12

## 2020-05-07 NOTE — Progress Notes (Signed)
Patient presents for BP check & labs in [facility] today. Reports no problems with VAD equipment or concerns with drive line.   He states he has been feeling great. Started Jardiance 10 mg, and stopped Spironolactone, last week as prescribed. BP stable today.   Vital Signs:  Doppler Pressure: 88 Automatc BP: 99/66 (77) HR: 72 SPO2: 96% on RA   Patient Instructions:  1. No medication changes today 2. Coumadin dosing per Ander Purpura PharmD 3. Return to clinic in 1 week for INR/LDH/BMET 4. Return to clinic in 2 months for full follow up visit with Dr Haroldine Laws   Emerson Monte RN [place] Coordinator  Office: [phone]  24/7 Pager: [phone]

## 2020-05-07 NOTE — Progress Notes (Signed)
LVAD INR 

## 2020-05-08 ENCOUNTER — Other Ambulatory Visit (HOSPITAL_COMMUNITY): Payer: Self-pay | Admitting: *Deleted

## 2020-05-08 DIAGNOSIS — Z7901 Long term (current) use of anticoagulants: Secondary | ICD-10-CM

## 2020-05-08 DIAGNOSIS — Z95811 Presence of heart assist device: Secondary | ICD-10-CM

## 2020-05-08 DIAGNOSIS — I5022 Chronic systolic (congestive) heart failure: Secondary | ICD-10-CM

## 2020-05-09 ENCOUNTER — Ambulatory Visit (HOSPITAL_COMMUNITY)
Admission: RE | Admit: 2020-05-09 | Discharge: 2020-05-09 | Disposition: A | Discharge status: Home / Self Care | Payer: Medicare Other | Source: Ambulatory Visit | Attending: Cardiology | Admitting: Cardiology

## 2020-05-09 ENCOUNTER — Other Ambulatory Visit (HOSPITAL_COMMUNITY): Payer: Self-pay | Admitting: Unknown Physician Specialty

## 2020-05-09 ENCOUNTER — Other Ambulatory Visit: Payer: Self-pay

## 2020-05-09 VITALS — BP 107/69 | HR 71 | Wt 164.8 lb

## 2020-05-09 DIAGNOSIS — I472 Ventricular tachycardia, unspecified: Secondary | ICD-10-CM

## 2020-05-09 DIAGNOSIS — Z7984 Long term (current) use of oral hypoglycemic drugs: Secondary | ICD-10-CM | POA: Insufficient documentation

## 2020-05-09 DIAGNOSIS — Z955 Presence of coronary angioplasty implant and graft: Secondary | ICD-10-CM | POA: Insufficient documentation

## 2020-05-09 DIAGNOSIS — Z79890 Hormone replacement therapy (postmenopausal): Secondary | ICD-10-CM | POA: Diagnosis not present

## 2020-05-09 DIAGNOSIS — Z7901 Long term (current) use of anticoagulants: Secondary | ICD-10-CM

## 2020-05-09 DIAGNOSIS — N1832 Chronic kidney disease, stage 3b: Secondary | ICD-10-CM | POA: Diagnosis not present

## 2020-05-09 DIAGNOSIS — E785 Hyperlipidemia, unspecified: Secondary | ICD-10-CM | POA: Insufficient documentation

## 2020-05-09 DIAGNOSIS — Z79899 Other long term (current) drug therapy: Secondary | ICD-10-CM | POA: Insufficient documentation

## 2020-05-09 DIAGNOSIS — I252 Old myocardial infarction: Secondary | ICD-10-CM | POA: Insufficient documentation

## 2020-05-09 DIAGNOSIS — Z7982 Long term (current) use of aspirin: Secondary | ICD-10-CM | POA: Insufficient documentation

## 2020-05-09 DIAGNOSIS — Z96651 Presence of right artificial knee joint: Secondary | ICD-10-CM | POA: Insufficient documentation

## 2020-05-09 DIAGNOSIS — E039 Hypothyroidism, unspecified: Secondary | ICD-10-CM | POA: Diagnosis not present

## 2020-05-09 DIAGNOSIS — I5022 Chronic systolic (congestive) heart failure: Secondary | ICD-10-CM | POA: Diagnosis not present

## 2020-05-09 DIAGNOSIS — Z95811 Presence of heart assist device: Secondary | ICD-10-CM | POA: Insufficient documentation

## 2020-05-09 DIAGNOSIS — I255 Ischemic cardiomyopathy: Secondary | ICD-10-CM | POA: Diagnosis not present

## 2020-05-09 DIAGNOSIS — R748 Abnormal levels of other serum enzymes: Secondary | ICD-10-CM | POA: Diagnosis not present

## 2020-05-09 DIAGNOSIS — Z95810 Presence of automatic (implantable) cardiac defibrillator: Secondary | ICD-10-CM | POA: Diagnosis not present

## 2020-05-09 DIAGNOSIS — E7800 Pure hypercholesterolemia, unspecified: Secondary | ICD-10-CM | POA: Diagnosis not present

## 2020-05-09 DIAGNOSIS — I2510 Atherosclerotic heart disease of native coronary artery without angina pectoris: Secondary | ICD-10-CM | POA: Insufficient documentation

## 2020-05-09 DIAGNOSIS — I130 Hypertensive heart and chronic kidney disease with heart failure and stage 1 through stage 4 chronic kidney disease, or unspecified chronic kidney disease: Secondary | ICD-10-CM | POA: Insufficient documentation

## 2020-05-09 LAB — CBC
HCT: 42.6 % (ref 39.0–52.0)
Hemoglobin: 13.9 g/dL (ref 13.0–17.0)
MCH: 30.8 pg (ref 26.0–34.0)
MCHC: 32.6 g/dL (ref 30.0–36.0)
MCV: 94.2 fL (ref 80.0–100.0)
Platelets: 177 10*3/uL (ref 150–400)
RBC: 4.52 MIL/uL (ref 4.22–5.81)
RDW: 14.6 % (ref 11.5–15.5)
WBC: 5.2 10*3/uL (ref 4.0–10.5)
nRBC: 0 % (ref 0.0–0.2)

## 2020-05-09 LAB — COMPREHENSIVE METABOLIC PANEL
ALT: 17 U/L (ref 0–44)
AST: 22 U/L (ref 15–41)
Albumin: 4.1 g/dL (ref 3.5–5.0)
Alkaline Phosphatase: 86 U/L (ref 38–126)
Anion gap: 11 (ref 5–15)
BUN: 24 mg/dL — ABNORMAL HIGH (ref 8–23)
CO2: 21 mmol/L — ABNORMAL LOW (ref 22–32)
Calcium: 9 mg/dL (ref 8.9–10.3)
Chloride: 106 mmol/L (ref 98–111)
Creatinine, Ser: 2.05 mg/dL — ABNORMAL HIGH (ref 0.61–1.24)
GFR calc Af Amer: 35 mL/min — ABNORMAL LOW (ref >60)
GFR calc non Af Amer: 31 mL/min — ABNORMAL LOW (ref >60)
Glucose, Bld: 98 mg/dL (ref 70–99)
Potassium: 3.9 mmol/L (ref 3.5–5.1)
Sodium: 138 mmol/L (ref 135–145)
Total Bilirubin: 0.9 mg/dL (ref 0.3–1.2)
Total Protein: 7.1 g/dL (ref 6.5–8.1)

## 2020-05-09 LAB — PROTIME-INR
INR: 1.7 — ABNORMAL HIGH (ref 0.8–1.2)
Prothrombin Time: 18.9 seconds — ABNORMAL HIGH (ref 11.4–15.2)

## 2020-05-09 LAB — MAGNESIUM: Magnesium: 2.3 mg/dL (ref 1.7–2.4)

## 2020-05-09 LAB — LACTATE DEHYDROGENASE: LDH: 223 U/L — ABNORMAL HIGH (ref 98–192)

## 2020-05-09 MED ORDER — SPIRONOLACTONE 25 MG PO TABS
25.0000 mg | ORAL_TABLET | Freq: Every day | ORAL | 3 refills | Status: DC
Start: 2020-05-09 — End: 2020-08-22

## 2020-05-09 NOTE — Progress Notes (Signed)
Patient presents for sick visit in [facility] today. Reports no problems with VAD equipment or concerns with drive line.   Pt was bent over cleaning the [employer] when his defibrillator fired.   He says he has not needed any prn Lasix.    Pt was seen 2 weeks ago and started on Jardiance. His Spironolactone was stopped at that time. Pt was seen on Tuesday this week for labs and BP check.   Pt has an appt w/EP on 10/5.  Vital Signs:  Doppler Pressure: 84 Automatc BP: 107/69 (89) HR: 71 SPO2: 96% on RA  Weight: 164.4 lb w/ eqt Last weight: 164.8 lb  VAD Indication: Destination therapy- patient choice    VAD interrogation & Equipment Management: Speed: 9200 Flow:  4.5 Power: 5.1w    PI: 7.1  Alarms: none Events: 5-10 PI events daily  Fixed speed 9200  Low speed limit: 8600  Primary Controller:  Replace back up battery in 29 months Back up controller:  Replace back up battery in  months  Annual Equipment Maintenance on UBC/PM was performed on 05/2019  I reviewed the LVAD parameters from today and compared the results to the patient's prior recorded data. LVAD interrogation was NEGATIVE for significant power changes, NEGATIVE for clinical alarms and STABLE for PI events/speed drops. No programming changes were made and pump is functioning within specified parameters. Pt is performing daily controller and system monitor self tests along with completing weekly and monthly maintenance for LVAD equipment.  Exit Site Care: Drive line is being maintained weekly by [name], his wife. Dressing dry and intact. Stabilization device present and accurately applied. Pt denies fever or chills. Pt has CHG allergy. Pt provided with 8 weekly dressing kits   Significant Events on VAD Support:  10/2013>SBO 12/2016> elevated LDH, admit for Bival  Device:Dual Medtronic Therapies:  V therapy on at 222 bpm; V monitor on 171         A therapy on 171 bpm (ramp and burst overdrive paing); A  monitor on 300 bpm  Pacing: AAI<=>DDD 60 bpm  Last check: 09/28/19  BP & Labs:  Doppler 84 - Doppler is reflecting MAP  Hgb 13.9 - No S/S of bleeding. Specifically denies melena/BRBPR or nosebleeds.  LDH 223 and is within established baseline of 200- 350. Denies tea-colored urine. No power elevations noted on interrogation.    Patient Instructions:  1. Restart Spironolactone 2. Stop Jardiance 10 mg daily 3. Return to clinic in 1 week for labs 4. Return to clinic in 2 months for a visit w/Dr. Loney Laurence RN [place] Coordinator  Office: [phone]  24/7 Pager: [phone]

## 2020-05-09 NOTE — Progress Notes (Signed)
VAD CLINIC NOTE   HPI:  Johnny Oneill is a 79 y.o. 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   Admitted 3/4-3/25/15 with SOB and syncopal episode. Found to have SBO and NG placed and started on TPN. On 11/06/13 developed progressive SOB and hypotension and co-ox 51% and Hgb 6.7. Started on milrinone and transfused. Echo showed nl RV function. Patient had PEA arrest and was intubated and started on pressors. CXR revealed a R hemothorax and a R CT placed. He required VATs and evacuation of hemothorax and was then teansferred to CIR.   LVAD Issues  Elevated LDH--bivalirudin. D/C LDH 188  Presented to ER on 06/27/19 with syncopal episode and head injury from fall. ECG showed VT 240. Underwent emergent DC-CV in ER. Head CT with no bleed. Was admitted and had IC gen replacement (EOL). Had small hematoma at site. Amio started. Post-discharge found to have episode of AFL on ICD remote check but has since resolved.    On 04/30/20 he was seen in the VAD clinic and spiro was stopped and jardiance was started.  He reports increased urine output after starting.   Today he returns for an acute visit due to ICD shock. Earlier today he was at [employer] cleaning. Says he felt dizzy after he stood up and had a shock. He thinks he passed out. Denies injury. Denies SOB/PND/Orthopnea. No bleeding issues. Taking all medications.   Optivol Interrogation ICD shock for VT, Impedance up, fluid index up, activity ~4 hours   VAD Indication: Destination therapy- patient choice    VAD interrogation & Equipment Management: Speed: 9200 Flow:  5.2 Power: 5.6w PI: 5.9 Alarms: none Events: 5-10  PI events daily Fixed speed 9200  Low speed limit: 8600 Primary Controller: Replace back up battery in 7 months Back up controller: Replace back up battery in  months  Annual Equipment Maintenance on UBC/PM was performed on  05/2019  Exit Site Care: Drive line is being maintained weekly by [name], his wife. Dressing dry and intact. Stabilization device present and accurately applied. Pt denies fever or chills. Pt has CHG allergy. Pt has sufficient dressing kits at home.     Past Medical History:  Diagnosis Date  . Anxiety   . Automatic implantable cardioverter-defibrillator in situ   . CAD (coronary artery disease)    S/P stenting of LCX in 2004;  s/p Lat MI 2009 with occlusion of the LCX - treated with Promus stenting. Has total occlusion of the RCA.   [name] CHF (congestive heart failure) ([place])   . Hypercholesteremia   . Hypertension   . Hypothyroidism   . ICD (implantable cardiac defibrillator) in place    PROPHYLACTIC      medtronic  . Inferior MI ([place]) "? date"; 2009  . Ischemic cardiomyopathy    a. 10/09 Echo: Sev LV Dysfxn, inf/lat AK, Mod MR.  . Liver disease   . LVAD (left ventricular assist device) present ([place])   . Osteoarthritis    a. s/p R TKR  . Pacemaker   . PVC (premature ventricular contraction)   . RBBB   . Shortness of breath     Current Outpatient Medications  Medication Sig Dispense Refill  . acetaminophen (TYLENOL) 500 MG chewable tablet Chew 500 mg by mouth every 6 (six) hours as needed for pain.    [name] aspirin EC 81 MG tablet Take 81 mg by mouth  daily.    . cholecalciferol (VITAMIN D3) 25 MCG (1000 UT) tablet Take 1,000 Units by mouth daily.    . citalopram (CELEXA) 10 MG tablet Take 1 tablet by mouth once daily 90 tablet 3  . Coenzyme Q10 (COQ10) 200 MG CAPS Take 200 mg by mouth daily.    . empagliflozin (JARDIANCE) 10 MG TABS tablet Take 1 tablet (10 mg total) by mouth daily before breakfast. 30 tablet 6  . enoxaparin (LOVENOX) 40 MG/0.4ML injection Inject 0.4 mLs (40 mg total) into the skin every 12 (twelve) hours. 4 mL 0  . famotidine (PEPCID) 20 MG tablet Take 20 mg by mouth daily.     . furosemide (LASIX) 20 MG tablet Take 20 mg by mouth daily as needed (Take 20 mg as  daily as needed for wt > 153 lbs). Reported on 01/14/2016 (Patient not taking: Reported on 02/13/2020)    . hydrALAZINE (APRESOLINE) 50 MG tablet TAKE 1 TABLET BY MOUTH THREE TIMES DAILY 270 tablet 3  . levothyroxine (EUTHYROX) 137 MCG tablet Takes half tablet on Sunday, full tablet all other days 30 tablet 2  . Melatonin 3 MG TABS Take 1 tablet by mouth at bedtime.    . pantoprazole (PROTONIX) 40 MG tablet Take 1 tablet by mouth once daily 90 tablet 11  . potassium chloride SA (K-DUR,KLOR-CON) 20 MEQ tablet Take 1 tablet (20 mEq total) by mouth daily as needed. Take one potassium pill when you take your lasix. (Patient not taking: Reported on 04/30/2020) 90 tablet 3  . Probiotic Product (PROBIOTIC PO) Take 1 capsule by mouth daily.    . rosuvastatin (CRESTOR) 20 MG tablet Take 1 tablet by mouth once daily 90 tablet 3  . traZODone (DESYREL) 50 MG tablet Take 1 tablet (50 mg total) by mouth at bedtime. 30 tablet 5  . warfarin (COUMADIN) 5 MG tablet Take 7.5 mg (1.5 tablets) every Monday/Wednesday/Friday and 5 mg (1 tablet) all other days or as directed by HF Clinic 90 tablet 6   No current facility-administered medications for this encounter.   Facility-Administered Medications Ordered in Other Encounters  Medication Dose Route Frequency Provider Last Rate Last Admin  . etomidate (AMIDATE) injection    Anesthesia Intra-op Myna Bright, CRNA   20 mg at 02/08/14 0915  . rocuronium [facility]) injection    Anesthesia Intra-op Myna Bright, CRNA   50 mg at 02/08/14 0932  . succinylcholine (ANECTINE) injection    Anesthesia Intra-op Myna Bright, CRNA   100 mg at 02/08/14 0915   Ace inhibitors, Diovan [valsartan], Lipitor [atorvastatin calcium], and Norvasc [amlodipine]  Review of systems complete and found to be negative unless listed in HPI.     Vital Signs:  Doppler Pressure: 84 Vitals:   05/09/20 1149  BP: 107/69  Pulse: 71  SPO2: 98% on room air.  RA  Weight: 164 w/  eqt Last weight: 164.0 lb   Physical Exam: GENERAL: Walked in the clinic. NAD  HEENT: normal  NECK: Supple, JVP 5-6   .  2+ bilaterally, no bruits.  No lymphadenopathy or thyromegaly appreciated.   CARDIAC:  Mechanical heart sounds with LVAD hum present.  LUNGS:  Clear to auscultation bilaterally.  ABDOMEN:  Soft, round, nontender, positive bowel sounds x4.     LVAD exit site: well-healed and incorporated.  Dressing dry and intact.  No erythema or drainage.  Stabilization device present and accurately applied.  Driveline dressing is being changed daily per sterile technique. EXTREMITIES:  Warm and  dry, no cyanosis, clubbing, rash or edema  NEUROLOGIC:  Alert and oriented x 4.  Gait steady.  No aphasia.  No dysarthria.  Affect pleasant.      ASSESSMENT AND PLAN:   1) Chronic systolic HF: ICM, EF 16-10% s/p LVAD HM II implant 08/2013 for DT.   - Echo 3/18 EF 10-15% mild AI. Mod RV dysfunction. LVAD cannula OK - Stable NYHA I with VAD support though limited by arthritis -Appears dry. Stop jardiance. Restart spironolactone 25 mg daily.  - He has not tolerated ACE-I or amlodipine or b-blocker in the past.  2) LVAD: HMII 08/2013 for DT. - VAD interrogated personally. Parameters stable. - Admitted in May 2018 with elevated LDH with bival.  - Check CBC, LDH, PT, LDH - Continue aspirin and coumadin.  - Hgb 13.9  stable - DL site looks good - MAPs ok  3) VT - now s/p ICD gen change.  - Off amio due to cerebellar side effects.  - Shock today for VT. He was instructed he cannot drive for 6 months.  - Suspect he is dry and may have suction events. Check BMET/Mag today.  - I personally discussed with EP.  Arranged follow up with Dr Curt Bears.  - May need to consider mexilitene - Keep K> 4.0 Mg > 2.0  4) Chronic anticoagulation: INR goal 2.0-3.0.    - Check INR.  -  Discussed dosing with PharmD personally. - Continue ASA 81 mg for LVAD + warfarin.   5) HTN:  - Stable.   6) PAFL -  Remains in NSR. On warfarin   7) Hypothyroidism:  - Followed by Dr Dwyane Dee. Stable.No change   8) Hyperlipidemia:  - Continue Crestor. Zetia stopped due to cost.  - No change.   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  - As above, switch Jardiance back to spiro  11) Elevated liver enzymes - normalized off amio. Liver u/s ok   Darrick Grinder, NP  11:49 AM   Agree with above.    Recently switched from spiro to SGLT2i for renal protectivity. Now presents for sick visit after ICD fired. Multiple PI events on VAD. K 3.9. Mg 2.3  ICD interrogation showed VT treated with shock. Creatinine 1.7 -> 2.0  General:  NAD.  HEENT: normal  Neck: supple. JVP not elevated.  Carotids 2+ bilat; no bruits. No lymphadenopathy or thryomegaly appreciated. Cor: LVAD hum.  Lungs: Clear. Abdomen: obese soft, nontender, non-distended. No hepatosplenomegaly. No bruits or masses. Good bowel sounds. Driveline site clean. Anchor in place.  Extremities: no cyanosis, clubbing, rash. Warm no edema  Neuro: alert & oriented x 3. No focal deficits. Moves all 4 without problem   Suspect volume depletion and suction event triggering VT. Will stop SGLT2i and switch back to spiro. Supp K. Will not restart amio due to severe intolerance (tremors). If VT recurs will need sotalol vs mexilitene. D/w EP. He will have f/u with Dr. Curt Bears.   No driving x 6 months.   Total time spent 45 minutes. Over half that time spent discussing above.   Glori Bickers, MD  1:40 PM

## 2020-05-14 ENCOUNTER — Ambulatory Visit (HOSPITAL_COMMUNITY): Payer: Self-pay | Admitting: Pharmacist

## 2020-05-14 ENCOUNTER — Other Ambulatory Visit: Payer: Self-pay

## 2020-05-14 ENCOUNTER — Ambulatory Visit (HOSPITAL_COMMUNITY)
Admission: RE | Admit: 2020-05-14 | Discharge: 2020-05-14 | Disposition: A | Discharge status: Home / Self Care | Payer: Medicare Other | Source: Ambulatory Visit | Attending: Cardiology | Admitting: Cardiology

## 2020-05-14 DIAGNOSIS — I5022 Chronic systolic (congestive) heart failure: Secondary | ICD-10-CM | POA: Diagnosis not present

## 2020-05-14 DIAGNOSIS — Z7901 Long term (current) use of anticoagulants: Secondary | ICD-10-CM

## 2020-05-14 DIAGNOSIS — Z95811 Presence of heart assist device: Secondary | ICD-10-CM

## 2020-05-14 LAB — BASIC METABOLIC PANEL
Anion gap: 10 (ref 5–15)
BUN: 25 mg/dL — ABNORMAL HIGH (ref 8–23)
CO2: 23 mmol/L (ref 22–32)
Calcium: 9.3 mg/dL (ref 8.9–10.3)
Chloride: 105 mmol/L (ref 98–111)
Creatinine, Ser: 1.81 mg/dL — ABNORMAL HIGH (ref 0.61–1.24)
GFR calc Af Amer: 41 mL/min — ABNORMAL LOW (ref >60)
GFR calc non Af Amer: 36 mL/min — ABNORMAL LOW (ref >60)
Glucose, Bld: 88 mg/dL (ref 70–99)
Potassium: 4.1 mmol/L (ref 3.5–5.1)
Sodium: 138 mmol/L (ref 135–145)

## 2020-05-14 LAB — LACTATE DEHYDROGENASE: LDH: 249 U/L — ABNORMAL HIGH (ref 98–192)

## 2020-05-14 LAB — PROTIME-INR
INR: 1.7 — ABNORMAL HIGH (ref 0.8–1.2)
Prothrombin Time: 19.5 seconds — ABNORMAL HIGH (ref 11.4–15.2)

## 2020-05-22 ENCOUNTER — Other Ambulatory Visit (HOSPITAL_COMMUNITY): Payer: Self-pay | Admitting: Internal Medicine

## 2020-05-22 ENCOUNTER — Other Ambulatory Visit (HOSPITAL_COMMUNITY): Payer: Self-pay | Admitting: *Deleted

## 2020-05-22 DIAGNOSIS — Z7901 Long term (current) use of anticoagulants: Secondary | ICD-10-CM

## 2020-05-22 DIAGNOSIS — Z95811 Presence of heart assist device: Secondary | ICD-10-CM

## 2020-05-22 DIAGNOSIS — K219 Gastro-esophageal reflux disease without esophagitis: Secondary | ICD-10-CM

## 2020-05-28 ENCOUNTER — Ambulatory Visit (HOSPITAL_COMMUNITY)
Admission: RE | Admit: 2020-05-28 | Discharge: 2020-05-28 | Disposition: A | Discharge status: Home / Self Care | Payer: Medicare Other | Source: Ambulatory Visit | Attending: Cardiology | Admitting: Cardiology

## 2020-05-28 ENCOUNTER — Ambulatory Visit (HOSPITAL_COMMUNITY): Payer: Self-pay | Admitting: Pharmacist

## 2020-05-28 ENCOUNTER — Other Ambulatory Visit: Payer: Self-pay

## 2020-05-28 ENCOUNTER — Other Ambulatory Visit: Payer: Self-pay | Admitting: Endocrinology

## 2020-05-28 DIAGNOSIS — Z95811 Presence of heart assist device: Secondary | ICD-10-CM | POA: Diagnosis not present

## 2020-05-28 DIAGNOSIS — Z7901 Long term (current) use of anticoagulants: Secondary | ICD-10-CM

## 2020-05-28 DIAGNOSIS — E890 Postprocedural hypothyroidism: Secondary | ICD-10-CM

## 2020-05-28 LAB — BASIC METABOLIC PANEL
Anion gap: 10 (ref 5–15)
BUN: 27 mg/dL — ABNORMAL HIGH (ref 8–23)
CO2: 24 mmol/L (ref 22–32)
Calcium: 9.7 mg/dL (ref 8.9–10.3)
Chloride: 102 mmol/L (ref 98–111)
Creatinine, Ser: 1.89 mg/dL — ABNORMAL HIGH (ref 0.61–1.24)
GFR calc Af Amer: 39 mL/min — ABNORMAL LOW (ref >60)
GFR calc non Af Amer: 34 mL/min — ABNORMAL LOW (ref >60)
Glucose, Bld: 99 mg/dL (ref 70–99)
Potassium: 4.1 mmol/L (ref 3.5–5.1)
Sodium: 136 mmol/L (ref 135–145)

## 2020-05-28 LAB — PROTIME-INR
INR: 2 — ABNORMAL HIGH (ref 0.8–1.2)
Prothrombin Time: 22.1 seconds — ABNORMAL HIGH (ref 11.4–15.2)

## 2020-05-28 LAB — LACTATE DEHYDROGENASE: LDH: 219 U/L — ABNORMAL HIGH (ref 98–192)

## 2020-05-28 MED ORDER — WARFARIN SODIUM 5 MG PO TABS
ORAL_TABLET | ORAL | 6 refills | Status: DC
Start: 2020-05-28 — End: 2020-06-12

## 2020-05-28 NOTE — Progress Notes (Signed)
LVAD INR 

## 2020-06-04 ENCOUNTER — Encounter: Payer: Medicare Other | Admitting: Cardiology

## 2020-06-04 ENCOUNTER — Other Ambulatory Visit: Payer: Self-pay

## 2020-06-04 ENCOUNTER — Other Ambulatory Visit (INDEPENDENT_AMBULATORY_CARE_PROVIDER_SITE_OTHER): Payer: Medicare Other

## 2020-06-04 DIAGNOSIS — E890 Postprocedural hypothyroidism: Secondary | ICD-10-CM | POA: Diagnosis not present

## 2020-06-04 LAB — T4, FREE: Free T4: 1.21 ng/dL (ref 0.60–1.60)

## 2020-06-04 LAB — TSH: TSH: 1.33 u[IU]/mL (ref 0.35–4.50)

## 2020-06-07 ENCOUNTER — Encounter: Payer: Self-pay | Admitting: Endocrinology

## 2020-06-07 ENCOUNTER — Ambulatory Visit (INDEPENDENT_AMBULATORY_CARE_PROVIDER_SITE_OTHER): Payer: Medicare Other | Admitting: Endocrinology

## 2020-06-07 ENCOUNTER — Other Ambulatory Visit (HOSPITAL_COMMUNITY): Payer: Self-pay | Admitting: Unknown Physician Specialty

## 2020-06-07 ENCOUNTER — Other Ambulatory Visit: Payer: Self-pay

## 2020-06-07 VITALS — BP 96/62 | HR 82 | Ht 67.0 in | Wt 166.0 lb

## 2020-06-07 DIAGNOSIS — Z95811 Presence of heart assist device: Secondary | ICD-10-CM

## 2020-06-07 DIAGNOSIS — I255 Ischemic cardiomyopathy: Secondary | ICD-10-CM

## 2020-06-07 DIAGNOSIS — E890 Postprocedural hypothyroidism: Secondary | ICD-10-CM

## 2020-06-07 DIAGNOSIS — Z7901 Long term (current) use of anticoagulants: Secondary | ICD-10-CM

## 2020-06-07 NOTE — Progress Notes (Signed)
Patient ID: Johnny Oneill, male   DOB: 07-30-1941, 79 y.o.   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  Previously was taking 200 mcg daily after 3/15 when he was hospitalized   Subsequently the dose was adjusted periodically    Current regimen: he has been taking 137 mcg daily with half tablet on Sundays Taking EUTHYROX brand  He feels fairly good, no unusual fatigue Weight is relatively stable  He is very regular with taking the tablet in the morning at least 30 minutes before breakfast with water.   Does not take any iron or calcium-containing vitamins at the same time, just taking vitamin D  His TSH which was low normal on the last visit is now excellent at 1.33 Free T4 relatively lower   Wt Readings from Last 3 Encounters:  06/07/20 166 lb (75.3 kg)  05/09/20 164 lb 12.8 oz (74.8 kg)  04/30/20 164 lb 12.8 oz (74.8 kg)    Lab Results  Component Value Date   TSH 1.33 06/04/2020   TSH 0.68 01/26/2020   TSH 2.098 10/10/2019   FREET4 1.21 06/04/2020   FREET4 1.56 01/26/2020   FREET4 1.26 08/18/2019      Allergies as of 06/07/2020      Reactions   Ace Inhibitors    Hypotension and elevated creatinine   Diovan [valsartan] Other (See Comments)   Hypotension at low dose, hyperkalemia   Lipitor [atorvastatin Calcium] Other (See Comments)   Muscle pain   Norvasc [amlodipine] Other (See Comments)   Put patient to sleep      Medication List       Accurate as of June 07, 2020 10:41 AM. If you have any questions, ask your nurse or doctor.        acetaminophen 500 MG chewable tablet Commonly  known as: TYLENOL Chew 500 mg by mouth every 6 (six) hours as needed for pain.   aspirin EC 81 MG tablet Take 81 mg by mouth daily.   cholecalciferol 25 MCG (1000 UNIT) tablet Commonly known as: VITAMIN D3 Take 1,000 Units by mouth daily.   citalopram 10 MG tablet Commonly known as: CELEXA Take 1 tablet by mouth once daily   CoQ10 200 MG Caps Take 200 mg by mouth daily.   enoxaparin 40 MG/0.4ML injection Commonly known as: LOVENOX Inject 0.4 mLs (40 mg total) into the skin every 12 (twelve) hours.   Euthyrox 137 MCG tablet Generic drug: levothyroxine TAKE ONE-HALF TABLET BY MOUTH ON SUNDAY, THEN TAKE 1 TABLET BY MOUTH ON ALL OTHER DAYS   famotidine 20 MG tablet Commonly known as: PEPCID Take 20 mg by mouth daily.   furosemide 20 MG tablet Commonly known as: LASIX Take 20 mg by mouth daily as needed (Take 20 mg as daily as needed for wt > 153 lbs). Reported on 01/14/2016   hydrALAZINE 50 MG tablet Commonly known as: APRESOLINE TAKE 1 TABLET BY MOUTH THREE TIMES DAILY   melatonin 3 MG Tabs tablet Take 1 tablet by mouth at bedtime.   pantoprazole 40 MG tablet Commonly known as: PROTONIX Take 1 tablet by mouth once daily   potassium chloride SA  20 MEQ tablet Commonly known as: KLOR-CON Take 1 tablet (20 mEq total) by mouth daily as needed. Take one potassium pill when you take your lasix.   PROBIOTIC PO Take 1 capsule by mouth daily.   rosuvastatin 20 MG tablet Commonly known as: CRESTOR Take 1 tablet by mouth once daily   spironolactone 25 MG tablet Commonly known as: ALDACTONE Take 1 tablet (25 mg total) by mouth daily.   traZODone 50 MG tablet Commonly known as: DESYREL Take 1 tablet (50 mg total) by mouth at bedtime.   warfarin 5 MG tablet Commonly known as: COUMADIN Take as directed by the anticoagulation clinic. If you are unsure how to take this medication, talk to your nurse or doctor. Original instructions: Take 5 mg MWF and 7.5 mg all other days  or as directed by HF Clinic        Past Medical History:  Diagnosis Date  . Anxiety   . Automatic implantable cardioverter-defibrillator in situ   . CAD (coronary artery disease)    S/P stenting of LCX in 2004;  s/p Lat MI 2009 with occlusion of the LCX - treated with Promus stenting. Has total occlusion of the RCA.   [name] CHF (congestive heart failure) ([place])   . Hypercholesteremia   . Hypertension   . Hypothyroidism   . ICD (implantable cardiac defibrillator) in place    PROPHYLACTIC      medtronic  . Inferior MI ([place]) "? date"; 2009  . Ischemic cardiomyopathy    a. 10/09 Echo: Sev LV Dysfxn, inf/lat AK, Mod MR.  . Liver disease   . LVAD (left ventricular assist device) present ([place])   . Osteoarthritis    a. s/p R TKR  . Pacemaker   . PVC (premature ventricular contraction)   . RBBB   . Shortness of breath     Past Surgical History:  Procedure Laterality Date  . CARPAL TUNNEL RELEASE Right 05/29/2016   Procedure: CARPAL TUNNEL RELEASE;  Surgeon: Earlie Server, MD;  Location: [facility];  Service: Orthopedics;  Laterality: Right;  . CORONARY ANGIOPLASTY WITH STENT PLACEMENT  2004  . CORONARY ANGIOPLASTY WITH STENT PLACEMENT  07/2008  . DEFIBRILLATOR  2009  . ESOPHAGOGASTRODUODENOSCOPY N/A 09/15/2013   Procedure: ESOPHAGOGASTRODUODENOSCOPY (EGD);  Surgeon: Ladene Artist, MD;  Location: [facility] ENDOSCOPY;  Service: Endoscopy;  Laterality: N/A;  bedside  . HEMATOMA EVACUATION Right 11/06/2013   Procedure: EVACUATION HEMATOMA;  Surgeon: Gaye Pollack, MD;  Location: [place];  Service: Cardiothoracic;  Laterality: Right;  . ICD GENERATOR CHANGEOUT N/A 06/28/2019   Procedure: ICD GENERATOR CHANGEOUT;  Surgeon: Constance Haw, MD;  Location: [place] CV LAB;  Service: Cardiovascular;  Laterality: N/A;  . INSERT / REPLACE / REMOVE PACEMAKER  2009  . INSERTION OF IMPLANTABLE LEFT VENTRICULAR ASSIST DEVICE N/A 09/18/2013   Procedure: INSERTION OF IMPLANTABLE LEFT VENTRICULAR ASSIST  DEVICE;  Surgeon: Ivin Poot, MD;  Location: [place];  Service: Open Heart Surgery;  Laterality: N/A;  CIRC ARREST  NITRIC OXIDE  . INTRA-AORTIC BALLOON PUMP INSERTION N/A 09/14/2013   Procedure: INTRA-AORTIC BALLOON PUMP INSERTION;  Surgeon: Jolaine Artist, MD;  Location: [facility] CATH LAB;  Service: Cardiovascular;  Laterality: N/A;  . INTRAOPERATIVE TRANSESOPHAGEAL ECHOCARDIOGRAM N/A 09/18/2013   Procedure: INTRAOPERATIVE TRANSESOPHAGEAL ECHOCARDIOGRAM;  Surgeon: Ivin Poot, MD;  Location: [facility];  Service: Open Heart Surgery;  Laterality: N/A;  . KNEE ARTHROSCOPY     bilaterally  . KNEE ARTHROSCOPY W/ PARTIAL MEDIAL MENISCECTOMY  12/2002  left  . LEFT VENTRICULAR ASSIST DEVICE    . RIGHT HEART CATHETERIZATION N/A 08/25/2013   Procedure: RIGHT HEART CATH;  Surgeon: Jolaine Artist, MD;  Location: [facility] CATH LAB;  Service: Cardiovascular;  Laterality: N/A;  . RIGHT HEART CATHETERIZATION N/A 09/13/2013   Procedure: RIGHT HEART CATH;  Surgeon: Jolaine Artist, MD;  Location: [facility] CATH LAB;  Service: Cardiovascular;  Laterality: N/A;  . RIGHT HEART CATHETERIZATION N/A 02/08/2014   Procedure: RIGHT HEART CATH;  Surgeon: Jolaine Artist, MD;  Location: [facility] CATH LAB;  Service: Cardiovascular;  Laterality: N/A;  . RIGHT HEART CATHETERIZATION N/A 02/12/2014   Procedure: RIGHT HEART CATH;  Surgeon: Jolaine Artist, MD;  Location: [name] CATH LAB;  Service: Cardiovascular;  Laterality: N/A;  . TOTAL KNEE ARTHROPLASTY  11/27/11   left  . TOTAL KNEE ARTHROPLASTY  11/27/2011   Procedure: TOTAL KNEE ARTHROPLASTY;  Surgeon: Yvette Rack., MD;  Location: [place];  Service: Orthopedics;  Laterality: Left;  left total knee arthroplasty  . VIDEO ASSISTED THORACOSCOPY Right 11/06/2013   Procedure: VIDEO ASSISTED THORACOSCOPY;  Surgeon: Gaye Pollack, MD;  Location: [facility] OR;  Service: Cardiothoracic;  Laterality: Right;    Family History  Problem Relation Age of Onset  . Heart failure Father   . Stroke  Father   . Coronary artery disease Mother   . Heart disease Mother   . Hyperlipidemia Sister     Social History:  reports that he has never smoked. He has never used smokeless tobacco. He reports that he does not drink alcohol and does not use drugs.  Allergies:  Allergies  Allergen Reactions  . Ace Inhibitors     Hypotension and elevated creatinine  . Diovan [Valsartan] Other (See Comments)    Hypotension at low dose, hyperkalemia  . Lipitor [Atorvastatin Calcium] Other (See Comments)    Muscle pain  . Norvasc [Amlodipine] Other (See Comments)    Put patient to sleep   ROS:  Cardiomyopathy: He has been treated with a ventricle assist device and he is on Coumadin  He was tried on Jardiance but apparently this worsened his renal function   Examination:   BP 96/62   Pulse 82   Ht 5\' 7"  (1.702 m)   Wt 166 lb (75.3 kg)   SpO2 96%   BMI 26.00 kg/m   Exam not indicated    Assessment/Plan:   Post ablative Hypothyroidism:   His levothyroxine dose has been more stable now Currently taking Euthyrox brand for consistency with getting the same generic  TSH is not as low with taking half tablet on Sundays along with 1 tablet on the other days  He will continue the same regimen every morning and follow-up in 6 months Reminded him to take his levothyroxine on empty stomach without any vitamins daily in the morning  There are no Patient Instructions on file for this visit.    Elayne Snare 06/07/2020, 10:41 AM

## 2020-06-09 ENCOUNTER — Other Ambulatory Visit: Payer: Self-pay

## 2020-06-09 ENCOUNTER — Emergency Department (HOSPITAL_COMMUNITY)
Admission: EM | Admit: 2020-06-09 | Discharge: 2020-06-09 | Disposition: A | Discharge status: Home / Self Care | Payer: Medicare Other | Attending: Emergency Medicine | Admitting: Emergency Medicine

## 2020-06-09 DIAGNOSIS — R42 Dizziness and giddiness: Secondary | ICD-10-CM | POA: Diagnosis not present

## 2020-06-09 DIAGNOSIS — R000 Tachycardia, unspecified: Secondary | ICD-10-CM | POA: Insufficient documentation

## 2020-06-09 DIAGNOSIS — Z95811 Presence of heart assist device: Secondary | ICD-10-CM | POA: Diagnosis not present

## 2020-06-09 DIAGNOSIS — Z7982 Long term (current) use of aspirin: Secondary | ICD-10-CM | POA: Insufficient documentation

## 2020-06-09 DIAGNOSIS — E039 Hypothyroidism, unspecified: Secondary | ICD-10-CM | POA: Diagnosis not present

## 2020-06-09 DIAGNOSIS — I4901 Ventricular fibrillation: Secondary | ICD-10-CM | POA: Diagnosis not present

## 2020-06-09 DIAGNOSIS — I5032 Chronic diastolic (congestive) heart failure: Secondary | ICD-10-CM | POA: Diagnosis not present

## 2020-06-09 DIAGNOSIS — Z955 Presence of coronary angioplasty implant and graft: Secondary | ICD-10-CM | POA: Insufficient documentation

## 2020-06-09 DIAGNOSIS — Z96652 Presence of left artificial knee joint: Secondary | ICD-10-CM | POA: Diagnosis not present

## 2020-06-09 DIAGNOSIS — I110 Hypertensive heart disease with heart failure: Secondary | ICD-10-CM | POA: Insufficient documentation

## 2020-06-09 DIAGNOSIS — Z7901 Long term (current) use of anticoagulants: Secondary | ICD-10-CM | POA: Diagnosis not present

## 2020-06-09 DIAGNOSIS — I472 Ventricular tachycardia: Secondary | ICD-10-CM

## 2020-06-09 DIAGNOSIS — Z79899 Other long term (current) drug therapy: Secondary | ICD-10-CM | POA: Insufficient documentation

## 2020-06-09 DIAGNOSIS — I5022 Chronic systolic (congestive) heart failure: Secondary | ICD-10-CM | POA: Diagnosis not present

## 2020-06-09 DIAGNOSIS — I2510 Atherosclerotic heart disease of native coronary artery without angina pectoris: Secondary | ICD-10-CM | POA: Diagnosis not present

## 2020-06-09 LAB — LACTATE DEHYDROGENASE: LDH: 250 U/L — ABNORMAL HIGH (ref 98–192)

## 2020-06-09 LAB — CBC WITH DIFFERENTIAL/PLATELET
Abs Immature Granulocytes: 0.04 10*3/uL (ref 0.00–0.07)
Basophils Absolute: 0 10*3/uL (ref 0.0–0.1)
Basophils Relative: 0 %
Eosinophils Absolute: 0.1 10*3/uL (ref 0.0–0.5)
Eosinophils Relative: 1 %
HCT: 48.1 % (ref 39.0–52.0)
Hemoglobin: 15.5 g/dL (ref 13.0–17.0)
Immature Granulocytes: 0 %
Lymphocytes Relative: 13 %
Lymphs Abs: 1.3 10*3/uL (ref 0.7–4.0)
MCH: 30.5 pg (ref 26.0–34.0)
MCHC: 32.2 g/dL (ref 30.0–36.0)
MCV: 94.7 fL (ref 80.0–100.0)
Monocytes Absolute: 0.8 10*3/uL (ref 0.1–1.0)
Monocytes Relative: 8 %
Neutro Abs: 7.8 10*3/uL — ABNORMAL HIGH (ref 1.7–7.7)
Neutrophils Relative %: 78 %
Platelets: 212 10*3/uL (ref 150–400)
RBC: 5.08 MIL/uL (ref 4.22–5.81)
RDW: 14.5 % (ref 11.5–15.5)
WBC: 10.1 10*3/uL (ref 4.0–10.5)
nRBC: 0 % (ref 0.0–0.2)

## 2020-06-09 LAB — COMPREHENSIVE METABOLIC PANEL
ALT: 18 U/L (ref 0–44)
AST: 40 U/L (ref 15–41)
Albumin: 4.1 g/dL (ref 3.5–5.0)
Alkaline Phosphatase: 89 U/L (ref 38–126)
Anion gap: 8 (ref 5–15)
BUN: 24 mg/dL — ABNORMAL HIGH (ref 8–23)
CO2: 24 mmol/L (ref 22–32)
Calcium: 9.3 mg/dL (ref 8.9–10.3)
Chloride: 103 mmol/L (ref 98–111)
Creatinine, Ser: 1.93 mg/dL — ABNORMAL HIGH (ref 0.61–1.24)
GFR, Estimated: 33 mL/min — ABNORMAL LOW (ref >60)
Glucose, Bld: 129 mg/dL — ABNORMAL HIGH (ref 70–99)
Potassium: 3.8 mmol/L (ref 3.5–5.1)
Sodium: 135 mmol/L (ref 135–145)
Total Bilirubin: 0.8 mg/dL (ref 0.3–1.2)
Total Protein: 7.5 g/dL (ref 6.5–8.1)

## 2020-06-09 LAB — MAGNESIUM: Magnesium: 2.2 mg/dL (ref 1.7–2.4)

## 2020-06-09 LAB — PROTIME-INR
INR: 2 — ABNORMAL HIGH (ref 0.8–1.2)
Prothrombin Time: 22.3 seconds — ABNORMAL HIGH (ref 11.4–15.2)

## 2020-06-09 MED ORDER — SODIUM CHLORIDE 0.9 % IV BOLUS
1000.0000 mL | Freq: Once | INTRAVENOUS | Status: AC
  Administered 2020-06-09: 1000 mL via INTRAVENOUS

## 2020-06-09 MED ORDER — MIDAZOLAM HCL 2 MG/2ML IJ SOLN: 1.0000 mg | Freq: Once | INTRAMUSCULAR | Status: AC

## 2020-06-09 MED ORDER — MIDAZOLAM HCL 2 MG/2ML IJ SOLN
INTRAMUSCULAR | Status: AC
  Administered 2020-06-09: 1 mg via INTRAVENOUS
  Filled 2020-06-09: qty 2

## 2020-06-09 MED ORDER — SOTALOL HCL 80 MG PO TABS: 80.0000 mg | ORAL_TABLET | Freq: Every day | ORAL | 0 refills | Status: DC

## 2020-06-09 MED ORDER — SOTALOL HCL 80 MG PO TABS
80.0000 mg | ORAL_TABLET | ORAL | Status: AC
  Administered 2020-06-09: 80 mg via ORAL
  Filled 2020-06-09: qty 1

## 2020-06-09 NOTE — Discharge Instructions (Signed)
The LVAD clinic will call you for an appointment on Wednesday, 10/13. Please take sotalol 80 mg once daily. If any repeat dizziness or abnormal alerts from defibrillator or LVAD, please return to ED.

## 2020-06-09 NOTE — Consult Note (Signed)
VAD Team Consult Note   HPI:  Johnny Oneill is a 79 y.o. 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   Presented to ER on 06/27/19 with syncopal episode and head injury from fall. ECG showed VT 240. Underwent emergent DC-CV in ER. Head CT with no bleed. Was admitted and had ICD gen replacement (EOL). Had small hematoma at site. Amio started. Post-discharge found to have episode of AFL on ICD remote check but has since resolved.    On 04/30/20 he was seen in the VAD clinic and spiro was stopped and jardiance was started.  He reports increased urine output after starting. Amio stopped due to severe side effects (tremor)    On 05/09/20 seen in [facility] due to ICD shock. ICD settings changed  His wife called today to report dizziness and reduced flows on VAD. I had them come to ER. On arrival to ER I met them and brought them into ER 1. He was a bit lightheaded but mentating well. MAP in 80s.   ECG showed monomorphic VT with rate 195. I reached to Dr. Caryl Comes who interrogated device in ER and attempted to pace out. VT sped up to 260-270. Decision made to externally cardiovert. Dr. Gilford Raid (ED MD) preparing to sedate when patient shocked by his ICD back to NSR.  K 3.8 MG 2.2    VAD Indication: Destination therapy- patient choice    VAD interrogation & Equipment Management: Speed: 9200 Flow:  3.5 Power: 4.7w PI: 4.3 Alarms: none Events: 5-10  PI events daily Fixed speed 9200  Low speed limit: 8600   Annual Equipment Maintenance on UBC/PM was performed on 05/2019  Exit Site Care: Drive line is being maintained weekly by [name], his wife. Dressing dry and intact. Stabilization device present and accurately applied. Pt denies fever or chills. Pt has CHG allergy. Pt has sufficient dressing kits at home.     Past Medical History:  Diagnosis Date  . Anxiety   . Automatic implantable  cardioverter-defibrillator in situ   . CAD (coronary artery disease)    S/P stenting of LCX in 2004;  s/p Lat MI 2009 with occlusion of the LCX - treated with Promus stenting. Has total occlusion of the RCA.   [name] CHF (congestive heart failure) ([place])   . Hypercholesteremia   . Hypertension   . Hypothyroidism   . ICD (implantable cardiac defibrillator) in place    PROPHYLACTIC      medtronic  . Inferior MI ([place]) "? date"; 2009  . Ischemic cardiomyopathy    a. 10/09 Echo: Sev LV Dysfxn, inf/lat AK, Mod MR.  . Liver disease   . LVAD (left ventricular assist device) present ([place])   . Osteoarthritis    a. s/p R TKR  . Pacemaker   . PVC (premature ventricular contraction)   . RBBB   . Shortness of breath     No current facility-administered medications for this encounter.   Current Outpatient Medications  Medication Sig Dispense Refill  . acetaminophen (TYLENOL) 500 MG chewable tablet Chew 500 mg by mouth every 6 (six) hours as needed for pain.    [name] aspirin EC 81 MG tablet Take 81 mg by mouth daily.    . cholecalciferol (VITAMIN D3) 25 MCG (1000 UT) tablet Take 1,000 Units by mouth daily.    . citalopram (CELEXA) 10 MG tablet Take 1 tablet by mouth once  daily 90 tablet 3  . Coenzyme Q10 (COQ10) 200 MG CAPS Take 200 mg by mouth daily.    [name] enoxaparin (LOVENOX) 40 MG/0.4ML injection Inject 0.4 mLs (40 mg total) into the skin every 12 (twelve) hours. 4 mL 0  . EUTHYROX 137 MCG tablet TAKE ONE-HALF TABLET BY MOUTH ON SUNDAY, THEN TAKE 1 TABLET BY MOUTH ON ALL OTHER DAYS 30 tablet 0  . famotidine (PEPCID) 20 MG tablet Take 20 mg by mouth daily.     . furosemide (LASIX) 20 MG tablet Take 20 mg by mouth daily as needed (Take 20 mg as daily as needed for wt > 153 lbs). Reported on 01/14/2016    . hydrALAZINE (APRESOLINE) 50 MG tablet TAKE 1 TABLET BY MOUTH THREE TIMES DAILY 270 tablet 3  . Melatonin 3 MG TABS Take 1 tablet by mouth at bedtime.    . pantoprazole (PROTONIX) 40 MG tablet Take 1  tablet by mouth once daily 90 tablet 0  . potassium chloride SA (K-DUR,KLOR-CON) 20 MEQ tablet Take 1 tablet (20 mEq total) by mouth daily as needed. Take one potassium pill when you take your lasix. 90 tablet 3  . Probiotic Product (PROBIOTIC PO) Take 1 capsule by mouth daily.    . rosuvastatin (CRESTOR) 20 MG tablet Take 1 tablet by mouth once daily 90 tablet 3  . sotalol (BETAPACE) 80 MG tablet Take 1 tablet (80 mg total) by mouth daily. 30 tablet 0  . spironolactone (ALDACTONE) 25 MG tablet Take 1 tablet (25 mg total) by mouth daily. 90 tablet 3  . traZODone (DESYREL) 50 MG tablet Take 1 tablet (50 mg total) by mouth at bedtime. 30 tablet 5  . warfarin (COUMADIN) 5 MG tablet Take 5 mg MWF and 7.5 mg all other days or as directed by HF Clinic 90 tablet 6   Facility-Administered Medications Ordered in Other Encounters  Medication Dose Route Frequency Provider Last Rate Last Admin  . etomidate (AMIDATE) injection    Anesthesia Intra-op Lucinda Dell, CRNA   20 mg at 02/08/14 0915  . rocuronium [facility]) injection    Anesthesia Intra-op Lucinda Dell, CRNA   50 mg at 02/08/14 0932  . succinylcholine (ANECTINE) injection    Anesthesia Intra-op Lucinda Dell, CRNA   100 mg at 02/08/14 0915   Ace inhibitors, Diovan [valsartan], Lipitor [atorvastatin calcium], and Norvasc [amlodipine]  Review of systems complete and found to be negative unless listed in HPI.     Vital Signs:   Vitals:   06/09/20 1503  BP: (!) 88/67  Pulse: 67  Resp: 16  SpO2: 100%     Physical Exam: General:  NAD.  HEENT: normal  Neck: supple. JVP not elevated.  Carotids 2+ bilat; no bruits. No lymphadenopathy or thryomegaly appreciated. Cor: LVAD hum.  Lungs: Clear. Abdomen: obese soft, nontender, non-distended. No hepatosplenomegaly. No bruits or masses. Good bowel sounds. Driveline site clean. Anchor in place.  Extremities: no cyanosis, clubbing, rash. Warm no edema  Neuro: alert & oriented x  3. No focal deficits. Moves all 4 without problem      ASSESSMENT AND PLAN:   1) VT, recurrent - now s/p ICD firing. This is 3rd episode in 1 year - Unable to tolerate amio due to cerebellar side effects - d/w Dr. Graciela Husbands will start sotalol 80 daily - Keep K > 4.0 Mg > 2.0  - No driving x 6 months  2) Chronic systolic HF: ICM, EF 20-25% s/p LVAD HM II implant  08/2013 for DT.   - Echo 10/20 EF 10-15% mild AI. Mod RV dysfunction. LVAD cannula OK - Stable NYHA I-II with VAD support though limited by arthritis - Volume status ok   3) LVAD: HMII 08/2013 for DT. - VAD interrogated personally. - Admitted in May 2018 with elevated LDH with bival.  - LDH 250 - DL site looks good - MAPs ok despite VT  4) Chronic anticoagulation: INR goal 2.0-3.0.    - INR 2.0 - Continue ASA 81 mg for LVAD + warfarin.   5) HTN:  - Stable.   6) PAFL - Remains in NSR. On warfarin   7) Hypothyroidism:  - Followed by Dr Dwyane Dee. Stable.No change   8) Hyperlipidemia:  - Continue Crestor. Zetia stopped due to cost.  - No change.   9) CAD - no s/s of iscehmia  10) CKD IIIb - creatinine stable 1.9 - failed SGLT2i due to volume depletion  CRITICAL CARE Performed by: Glori Bickers  Total critical care time: 60 minutes  Critical care time was exclusive of separately billable procedures and treating other patients.  Critical care was necessary to treat or prevent imminent or life-threatening deterioration.  Critical care was time spent personally by me (independent of midlevel providers or residents) on the following activities: development of treatment plan with patient and/or surrogate as well as nursing, discussions with consultants, evaluation of patient's response to treatment, examination of patient, obtaining history from patient or surrogate, ordering and performing treatments and interventions, ordering and review of laboratory studies, ordering and review of radiographic studies, pulse  oximetry and re-evaluation of patient's condition.    Glori Bickers, MD  3:29 PM

## 2020-06-09 NOTE — ED Provider Notes (Signed)
Johnny Oneill Provider Note   CSN: [id number] Arrival date & time: 06/09/20  1300     History Chief Complaint  Patient presents with  . LVAD/ dizziness    Johnny Oneill is a 79 y.o. male.  79 year old male with LVAD presents with 1 day of dizziness.  It is happened twice before he had VF with syncope.  Per LVAD, he has had abnormal PI x25 hours with decreased pump flow to ~3L. He tried to hydrate at home which did not resolve the problem. He presented to ED and wife had already notified Dr. Jeffie Pollock who met him at ED. Patient was in VF to 190s on initial presentation. Endorses dizziness, no diaphoresis, n/v, syncope, or infectious symptoms.         Past Medical History:  Diagnosis Date  . Anxiety   . Automatic implantable cardioverter-defibrillator in situ   . CAD (coronary artery disease)    S/P stenting of LCX in 2004;  s/p Lat MI 2009 with occlusion of the LCX - treated with Promus stenting. Has total occlusion of the RCA.   [name] CHF (congestive heart failure) ([place])   . Hypercholesteremia   . Hypertension   . Hypothyroidism   . ICD (implantable cardiac defibrillator) in place    PROPHYLACTIC      medtronic  . Inferior MI ([place]) "? date"; 2009  . Ischemic cardiomyopathy    a. 10/09 Echo: Sev LV Dysfxn, inf/lat AK, Mod MR.  . Liver disease   . LVAD (left ventricular assist device) present ([place])   . Osteoarthritis    a. s/p R TKR  . Pacemaker   . PVC (premature ventricular contraction)   . RBBB   . Shortness of breath     Patient Active Problem List   Diagnosis Date Noted  . Chronic systolic CHF (congestive heart failure) ([place]) 01/02/2015  . Decreased appetite 11/01/2014  . Contact dermatitis 09/11/2014  . Inguinal hernia 08/21/2014  . Hemothorax 02/08/2014  . HTN (hypertension) 01/30/2014  . Chronic anticoagulation 01/08/2014  . Hypothyroid 12/20/2013  . Syncope 11/28/2013  . Hemothorax on right 11/06/2013  . Poor venous  access 11/02/2013  . LVAD (left ventricular assist device) present ([place]) 09/18/2013  . Weakness generalized 09/15/2013  . Nonspecific (abnormal) findings on radiological and other examination of gastrointestinal tract 09/14/2013  . Anemia, unspecified 09/14/2013  . 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 PVC (premature ventricular contraction) 12/08/2012  . VT (ventricular tachycardia) ([place]) 12/08/2012  . Essential hypertension, benign 11/12/2010  . ISCHEMIC CARDIOMYOPATHY 11/12/2010  . Chronic systolic heart failure ([place]) 11/12/2010  . Ischemic cardiomyopathy   . Myocardial infarction of lateral wall ([place])   . Hypercholesteremia   . Automatic implantable cardioverter-defibrillator in situ   . CAD (coronary artery disease)   . Osteoarthritis     Past Surgical History:  Procedure Laterality Date  . CARPAL TUNNEL RELEASE Right 05/29/2016   Procedure: CARPAL TUNNEL RELEASE;  Surgeon: Earlie Server, MD;  Location: [place];  Service: Orthopedics;  Laterality: Right;  . CORONARY ANGIOPLASTY WITH STENT PLACEMENT  2004  . CORONARY ANGIOPLASTY WITH STENT PLACEMENT  07/2008  . DEFIBRILLATOR  2009  . ESOPHAGOGASTRODUODENOSCOPY N/A 09/15/2013   Procedure: ESOPHAGOGASTRODUODENOSCOPY (EGD);  Surgeon: Ladene Artist, MD;  Location: [facility] ENDOSCOPY;  Service: Endoscopy;  Laterality: N/A;  bedside  . HEMATOMA EVACUATION Right 11/06/2013   Procedure: EVACUATION HEMATOMA;  Surgeon: Gaye Pollack, MD;  Location: [place];  Service: Cardiothoracic;  Laterality: Right;  .  ICD GENERATOR CHANGEOUT N/A 06/28/2019   Procedure: ICD GENERATOR CHANGEOUT;  Surgeon: Constance Haw, MD;  Location: [place] CV LAB;  Service: Cardiovascular;  Laterality: N/A;  . INSERT / REPLACE / REMOVE PACEMAKER  2009  . INSERTION OF IMPLANTABLE LEFT VENTRICULAR ASSIST DEVICE N/A 09/18/2013   Procedure: INSERTION OF IMPLANTABLE LEFT VENTRICULAR ASSIST DEVICE;  Surgeon: Ivin Poot, MD;  Location: [place];  Service: Open Heart  Surgery;  Laterality: N/A;  CIRC ARREST  NITRIC OXIDE  . INTRA-AORTIC BALLOON PUMP INSERTION N/A 09/14/2013   Procedure: INTRA-AORTIC BALLOON PUMP INSERTION;  Surgeon: Jolaine Artist, MD;  Location: [facility] CATH LAB;  Service: Cardiovascular;  Laterality: N/A;  . INTRAOPERATIVE TRANSESOPHAGEAL ECHOCARDIOGRAM N/A 09/18/2013   Procedure: INTRAOPERATIVE TRANSESOPHAGEAL ECHOCARDIOGRAM;  Surgeon: Ivin Poot, MD;  Location: [place];  Service: Open Heart Surgery;  Laterality: N/A;  . KNEE ARTHROSCOPY     bilaterally  . KNEE ARTHROSCOPY W/ PARTIAL MEDIAL MENISCECTOMY  12/2002   left  . LEFT VENTRICULAR ASSIST DEVICE    . RIGHT HEART CATHETERIZATION N/A 08/25/2013   Procedure: RIGHT HEART CATH;  Surgeon: Jolaine Artist, MD;  Location: [facility] C[facility] LAB;  Service: Cardiovascular;  Laterality: N/A;  . RIGHT HEART CATHETERIZATION N/A 09/13/2013   Procedure: RIGHT HEART CATH;  Surgeon: Jolaine Artist, MD;  Location: [facility] CATH LAB;  Service: Cardiovascular;  Laterality: N/A;  . RIGHT HEART CATHETERIZATION N/A 02/08/2014   Procedure: RIGHT HEART CATH;  Surgeon: Jolaine Artist, MD;  Location: [facility] CATH LAB;  Service: Cardiovascular;  Laterality: N/A;  . RIGHT HEART CATHETERIZATION N/A 02/12/2014   Procedure: RIGHT HEART CATH;  Surgeon: Jolaine Artist, MD;  Location: [facility] CATH LAB;  Service: Cardiovascular;  Laterality: N/A;  . TOTAL KNEE ARTHROPLASTY  11/27/11   left  . TOTAL KNEE ARTHROPLASTY  11/27/2011   Procedure: TOTAL KNEE ARTHROPLASTY;  Surgeon: Yvette Rack., MD;  Location: [place];  Service: Orthopedics;  Laterality: Left;  left total knee arthroplasty  . VIDEO ASSISTED THORACOSCOPY Right 11/06/2013   Procedure: VIDEO ASSISTED THORACOSCOPY;  Surgeon: Gaye Pollack, MD;  Location: [facility] OR;  Service: Cardiothoracic;  Laterality: Right;       Family History  Problem Relation Age of Onset  . Heart failure Father   . Stroke Father   . Coronary artery disease Mother   . Heart disease Mother   .  Hyperlipidemia Sister     Social History   Tobacco Use  . Smoking status: Never Smoker  . Smokeless tobacco: Never Used  Vaping Use  . Vaping Use: Never used  Substance Use Topics  . Alcohol use: No  . Drug use: No    Types: Marijuana    Comment: "back in our younger days we smoked a little pot"    Home Medications Prior to Admission medications   Medication Sig Start Date End Date Taking? Authorizing Provider  acetaminophen (TYLENOL) 500 MG chewable tablet Chew 500 mg by mouth every 6 (six) hours as needed for pain.    [provider]  aspirin EC 81 MG tablet Take 81 mg by mouth daily.    [provider]  cholecalciferol (VITAMIN D3) 25 MCG (1000 UT) tablet Take 1,000 Units by mouth daily.    [provider]  citalopram (CELEXA) 10 MG tablet Take 1 tablet by mouth once daily 03/19/20   Bensimhon, Shaune Pascal, MD  Coenzyme Q10 (COQ10) 200 MG CAPS Take 200 mg by mouth daily.  [provider]  enoxaparin (LOVENOX) 40 MG/0.4ML injection Inject 0.4 mLs (40 mg total) into the skin every 12 (twelve) hours. 05/07/20   Bensimhon, Shaune Pascal, MD  EUTHYROX 137 MCG tablet TAKE ONE-HALF TABLET BY MOUTH ON SUNDAY, THEN TAKE 1 TABLET BY MOUTH ON ALL OTHER DAYS 05/28/20   Elayne Snare, MD  famotidine (PEPCID) 20 MG tablet Take 20 mg by mouth daily.     [provider]  furosemide (LASIX) 20 MG tablet Take 20 mg by mouth daily as needed (Take 20 mg as daily as needed for wt > 153 lbs). Reported on 01/14/2016    [provider]  hydrALAZINE (APRESOLINE) 50 MG tablet TAKE 1 TABLET BY MOUTH THREE TIMES DAILY 02/21/19   Bensimhon, Shaune Pascal, MD  Melatonin 3 MG TABS Take 1 tablet by mouth at bedtime.    [provider]  pantoprazole (PROTONIX) 40 MG tablet Take 1 tablet by mouth once daily 05/22/20   Bensimhon, Shaune Pascal, MD  potassium chloride SA (K-DUR,KLOR-CON) 20 MEQ tablet Take 1 tablet (20 mEq total) by mouth daily as needed. Take one potassium pill  when you take your lasix. 01/02/15   Bensimhon, Shaune Pascal, MD  Probiotic Product (PROBIOTIC PO) Take 1 capsule by mouth daily.    [provider]  rosuvastatin (CRESTOR) 20 MG tablet Take 1 tablet by mouth once daily 08/29/19   Bensimhon, Shaune Pascal, MD  spironolactone (ALDACTONE) 25 MG tablet Take 1 tablet (25 mg total) by mouth daily. 05/09/20 08/07/20  Bensimhon, Shaune Pascal, MD  traZODone (DESYREL) 50 MG tablet Take 1 tablet (50 mg total) by mouth at bedtime. 10/31/19   Bensimhon, Shaune Pascal, MD  warfarin (COUMADIN) 5 MG tablet Take 5 mg MWF and 7.5 mg all other days or as directed by HF Clinic 05/28/20   Orma Render, RPH-CPP    Allergies    Ace inhibitors, Diovan [valsartan], Lipitor [atorvastatin calcium], and Norvasc [amlodipine]  Review of Systems   Review of Systems  Constitutional: Negative for activity change, chills, diaphoresis and fever.  Neurological: Positive for dizziness.  All other systems reviewed and are negative.   Physical Exam Updated Vital Signs There were no vitals taken for this visit.  Physical Exam Vitals and nursing note reviewed.  Constitutional:      General: He is in acute distress.     Appearance: Normal appearance. He is normal weight. He is not ill-appearing, toxic-appearing or diaphoretic.     Comments: Thin, elderly gentleman AOx4 speaking in full sentences, in mild distress  HENT:     Head: Normocephalic and atraumatic.  Cardiovascular:     Rate and Rhythm: Tachycardia present. Rhythm irregular.  Skin:    General: Skin is warm and dry.  Neurological:     General: No focal deficit present.     Mental Status: He is alert and oriented to person, place, and time. Mental status is at baseline.  Psychiatric:        Mood and Affect: Mood normal.        Behavior: Behavior normal.    ED Results / Procedures / Treatments   Labs (all labs ordered are listed, but only abnormal results are displayed) Labs Reviewed  CBC WITH DIFFERENTIAL/PLATELET -  Abnormal; Notable for the following components:      Result Value   Neutro Abs 7.8 (*)    All other components within normal limits  PROTIME-INR - Abnormal; Notable for the following components:   Prothrombin Time 22.3 (*)  INR 2.0 (*)    All other components within normal limits  COMPREHENSIVE METABOLIC PANEL  LACTATE DEHYDROGENASE  MAGNESIUM    EKG EKG Interpretation  Date/Time:  $RemoveBef'Sunday June 09 2020 13:26:15 EDT Ventricular Rate:  201 PR Interval:    QRS Duration: 176 QT Interval:  284 QTC Calculation: 520 R Axis:   177 Text Interpretation: Extreme tachycardia with wide complex, no further rhythm analysis attempted Artifact in lead(s) II III aVF V3 Ventricular tachycardia Confirmed by Haviland, Julie ([id number]) on 06/09/2020 2:16:38 PM   Radiology No results found.  Procedures Procedures (including critical care time)  Medications Ordered in ED Medications  sotalol (BETAPACE) tablet 80 mg (has no administration in time range)  sodium chloride 0.9 % bolus 1,000 mL (1,000 mLs Intravenous New Bag/Given 06/09/20 1330)  midazolam (VERSED) injection 1 mg (1 mg Intravenous Given 06/09/20 1355)    ED Course  I have reviewed the triage vital signs and the nursing notes.  Pertinent labs & imaging results that were available during my care of the patient were reviewed by me and considered in my medical decision making (see chart for details).    MDM Rules/Calculators/A&P                          78'[id number]$  yo gentleman w/ LVAD presenting in VF to 190s. Drs. Caryl Comes, Bensimohn and [place] at bedside. Attempted to pace patient out of VF, however patient became tachycardic to 260-270 and remained in VF. MAP remained approx 80, pump flow 2-7-2.9L, 2 large bore IVs placed, 500cc NS started, cardioversion pads in place. Patient remained alert and speaking. Decision made to cardiovert, while awaiting versed for sedation, patient's own defibrillator activated and converted him to sinus rhythm  with BP in 70-90s range. $RemoveBefor'1mg'[id number]$  versed given for relaxation after awake cardioversion, ultimately sotalol not needed in ED. Per Dr. Jeffie Pollock, will observe patient for 1 hour, if no subsequent arrhythmias and patient VSS, can be discharged home with sotalol 80 mg qd and follow up on Wednesday at [facility].  Recheck at 1511: VSS, HR in 70s, NSR, asymptomatic. Given return precautions, ok for discharge.  Final Clinical Impression(s) / ED Diagnoses Final diagnoses:  None    Rx / DC Orders ED Discharge Orders    None       Gladys Damme, MD 06/09/20 1511    Isla Pence, MD 06/09/20 [phone]

## 2020-06-09 NOTE — ED Triage Notes (Signed)
Pt arrives POV with LVAD/VTACH . Dr. Zoila Shutter and Dr. Cleda Mccreedy at bedside. Heart rate 240

## 2020-06-09 NOTE — ED Notes (Signed)
Pt Defibrillator activated and shocked.  NSR archived with rate of 84

## 2020-06-10 ENCOUNTER — Telehealth (HOSPITAL_COMMUNITY): Payer: Self-pay | Admitting: *Deleted

## 2020-06-10 ENCOUNTER — Other Ambulatory Visit (HOSPITAL_COMMUNITY): Payer: Self-pay | Admitting: Internal Medicine

## 2020-06-10 ENCOUNTER — Other Ambulatory Visit (HOSPITAL_COMMUNITY): Payer: Self-pay | Admitting: *Deleted

## 2020-06-10 DIAGNOSIS — I472 Ventricular tachycardia, unspecified: Secondary | ICD-10-CM

## 2020-06-10 DIAGNOSIS — Z79899 Other long term (current) drug therapy: Secondary | ICD-10-CM

## 2020-06-10 DIAGNOSIS — I10 Essential (primary) hypertension: Secondary | ICD-10-CM

## 2020-06-10 NOTE — Telephone Encounter (Signed)
Contacted pt's wife per Dr. Haroldine Laws. Left message informing her pt has [facility] Clinic appt this Wednesday at 9:00 am. Changed lab visit that day to full visit for ED f/u for VT. [name] verbalized agreement to same.  Zada Girt RN, [place] Coordinator [phone]

## 2020-06-12 ENCOUNTER — Other Ambulatory Visit (HOSPITAL_COMMUNITY): Payer: Self-pay | Admitting: *Deleted

## 2020-06-12 ENCOUNTER — Ambulatory Visit (HOSPITAL_COMMUNITY)
Admission: RE | Admit: 2020-06-12 | Discharge: 2020-06-12 | Disposition: A | Discharge status: Home / Self Care | Payer: Medicare Other | Source: Ambulatory Visit | Attending: Cardiology | Admitting: Cardiology

## 2020-06-12 ENCOUNTER — Ambulatory Visit (HOSPITAL_COMMUNITY): Payer: Self-pay | Admitting: Pharmacist

## 2020-06-12 ENCOUNTER — Other Ambulatory Visit (HOSPITAL_COMMUNITY): Payer: Medicare Other

## 2020-06-12 ENCOUNTER — Other Ambulatory Visit: Payer: Self-pay

## 2020-06-12 ENCOUNTER — Encounter (HOSPITAL_COMMUNITY): Payer: Self-pay

## 2020-06-12 DIAGNOSIS — Z7901 Long term (current) use of anticoagulants: Secondary | ICD-10-CM | POA: Insufficient documentation

## 2020-06-12 DIAGNOSIS — Z79899 Other long term (current) drug therapy: Secondary | ICD-10-CM

## 2020-06-12 DIAGNOSIS — Z452 Encounter for adjustment and management of vascular access device: Secondary | ICD-10-CM | POA: Insufficient documentation

## 2020-06-12 DIAGNOSIS — Z95811 Presence of heart assist device: Secondary | ICD-10-CM | POA: Insufficient documentation

## 2020-06-12 DIAGNOSIS — I472 Ventricular tachycardia, unspecified: Secondary | ICD-10-CM

## 2020-06-12 DIAGNOSIS — Z09 Encounter for follow-up examination after completed treatment for conditions other than malignant neoplasm: Secondary | ICD-10-CM | POA: Diagnosis present

## 2020-06-12 DIAGNOSIS — Z5181 Encounter for therapeutic drug level monitoring: Secondary | ICD-10-CM | POA: Insufficient documentation

## 2020-06-12 LAB — BASIC METABOLIC PANEL
Anion gap: 10 (ref 5–15)
BUN: 24 mg/dL — ABNORMAL HIGH (ref 8–23)
CO2: 24 mmol/L (ref 22–32)
Calcium: 9.2 mg/dL (ref 8.9–10.3)
Chloride: 105 mmol/L (ref 98–111)
Creatinine, Ser: 1.82 mg/dL — ABNORMAL HIGH (ref 0.61–1.24)
GFR, Estimated: 35 mL/min — ABNORMAL LOW (ref >60)
Glucose, Bld: 95 mg/dL (ref 70–99)
Potassium: 4.1 mmol/L (ref 3.5–5.1)
Sodium: 139 mmol/L (ref 135–145)

## 2020-06-12 LAB — PROTIME-INR
INR: 2 — ABNORMAL HIGH (ref 0.8–1.2)
Prothrombin Time: 21.9 seconds — ABNORMAL HIGH (ref 11.4–15.2)

## 2020-06-12 LAB — LACTATE DEHYDROGENASE: LDH: 271 U/L — ABNORMAL HIGH (ref 98–192)

## 2020-06-12 MED ORDER — WARFARIN SODIUM 5 MG PO TABS
ORAL_TABLET | ORAL | 6 refills | Status: DC
Start: 2020-06-12 — End: 2020-07-03

## 2020-06-12 NOTE — Progress Notes (Signed)
LVAD INR 

## 2020-06-12 NOTE — Progress Notes (Signed)
Patient presents for ED visit f/u visit in [facility] today with wife. Reports no problems with VAD equipment or concerns with drive line.   Pt reports one brief episode of dizziness since ED visit that lasted "few seconds", denies any ICD shocks since that visit. Medtronic bedside interrogation obtained and reviewed by Dr. Haroldine Laws.   Pt confirms he started Sotalol 80 mg daily. EKG obtained and reviewed by Dr. Haroldine Laws. Pt scheduled for repeat EKG on Monday, 06/17/20.   Vital Signs:  Temp: 97.5 Doppler Pressure: 84 Automatc BP: 102/78 (85) HR: 61 SPO2: 98% on RA  Weight: 166.2 lb w/ eqt Last weight: 164.4 lb  VAD Indication: Destination therapy- patient choice    VAD interrogation & Equipment Management: Speed: 9200 Flow:  4.0 Power: 4.9w    PI: 6.7  Alarms: none Events: 5-10 PI events daily  Fixed speed 9200  Low speed limit: 8600  Primary Controller:  Replace back up battery in 28 months Back up controller:  Replace back up battery - pt did not bring  Annual Equipment Maintenance on UBC/PM was performed on 10/2019.  I reviewed the LVAD parameters from today and compared the results to the patient's prior recorded data. LVAD interrogation was NEGATIVE for significant power changes, NEGATIVE for clinical alarms and STABLE for PI events/speed drops. No programming changes were made and pump is functioning within specified parameters. Pt is performing daily controller and system monitor self tests along with completing weekly and monthly maintenance for LVAD equipment.  Exit Site Care: Drive line is being maintained weekly by [name], his wife. Dressing dry and intact. Stabilization device present and accurately applied. Pt denies fever or chills. Pt has CHG allergy. Pt has adequate dressing supplies at home.   Significant Events on VAD Support:  10/2013>SBO 12/2016> elevated LDH, admit for Bival  Device:Dual Medtronic Therapies:  V therapy on at 222 bpm; V monitor on  171         A therapy on 171 bpm (ramp and burst overdrive paing); A monitor on 300 bpm  Pacing: AAI<=>DDD 60 bpm  Last check: 03/28/20  BP & Labs:  Doppler 84 - Doppler is reflecting MAP  Hgb not done today - No S/S of bleeding. Specifically denies melena/BRBPR or nosebleeds.  LDH pending - established baseline of 200- 350. Denies tea-colored urine. No power elevations noted on interrogation.   Patient Instructions:  1. Repeat EKG Monday, 06/17/20. 2. No medication changes. 3. Call VAD pager in future if any issues; VAD pager # provided; 4 bracelets given with VAD emergency contact info. 4. Return to [facility] in 2 months.    Zada Girt RN [place] Coordinator  Office: [phone]  24/7 Pager: [phone]

## 2020-06-17 ENCOUNTER — Encounter (HOSPITAL_COMMUNITY): Payer: Self-pay | Admitting: *Deleted

## 2020-06-17 ENCOUNTER — Ambulatory Visit (HOSPITAL_COMMUNITY)
Admission: RE | Admit: 2020-06-17 | Discharge: 2020-06-17 | Disposition: A | Discharge status: Home / Self Care | Payer: Medicare Other | Source: Ambulatory Visit | Attending: Internal Medicine | Admitting: Internal Medicine

## 2020-06-17 DIAGNOSIS — I4510 Unspecified right bundle-branch block: Secondary | ICD-10-CM | POA: Insufficient documentation

## 2020-06-17 DIAGNOSIS — Z95811 Presence of heart assist device: Secondary | ICD-10-CM | POA: Insufficient documentation

## 2020-06-17 DIAGNOSIS — I472 Ventricular tachycardia: Secondary | ICD-10-CM | POA: Diagnosis not present

## 2020-06-17 NOTE — Patient Instructions (Signed)
Pt presented for EKG today. EKG results reviewed by Dr. Haroldine Laws and Dr. Lovena Le. Instructed pt to continue Sotolal 80 mg daily. Pt and wife verbalized understanding of same.  Zada Girt RN, [place] Coordinator [phone]

## 2020-06-20 ENCOUNTER — Other Ambulatory Visit (HOSPITAL_COMMUNITY): Payer: Self-pay | Admitting: Unknown Physician Specialty

## 2020-06-20 DIAGNOSIS — Z7901 Long term (current) use of anticoagulants: Secondary | ICD-10-CM

## 2020-06-20 DIAGNOSIS — Z95811 Presence of heart assist device: Secondary | ICD-10-CM

## 2020-06-24 ENCOUNTER — Ambulatory Visit (HOSPITAL_COMMUNITY)
Admission: RE | Admit: 2020-06-24 | Discharge: 2020-06-24 | Disposition: A | Discharge status: Home / Self Care | Payer: Medicare Other | Source: Ambulatory Visit | Attending: Cardiology | Admitting: Cardiology

## 2020-06-24 ENCOUNTER — Encounter (HOSPITAL_COMMUNITY): Payer: Self-pay

## 2020-06-24 ENCOUNTER — Other Ambulatory Visit: Payer: Self-pay

## 2020-06-24 DIAGNOSIS — Z95811 Presence of heart assist device: Secondary | ICD-10-CM

## 2020-06-24 DIAGNOSIS — Z7901 Long term (current) use of anticoagulants: Secondary | ICD-10-CM | POA: Diagnosis not present

## 2020-06-24 LAB — MAGNESIUM: Magnesium: 2.3 mg/dL (ref 1.7–2.4)

## 2020-06-24 LAB — LACTATE DEHYDROGENASE: LDH: 228 U/L — ABNORMAL HIGH (ref 98–192)

## 2020-06-27 ENCOUNTER — Ambulatory Visit (INDEPENDENT_AMBULATORY_CARE_PROVIDER_SITE_OTHER): Payer: Medicare Other

## 2020-06-27 DIAGNOSIS — I255 Ischemic cardiomyopathy: Secondary | ICD-10-CM

## 2020-06-27 LAB — CUP PACEART REMOTE DEVICE CHECK
Battery Remaining Longevity: 98 mo
Battery Voltage: 3.01 V
Brady Statistic AP VP Percent: 1.39 %
Brady Statistic AP VS Percent: 80.38 %
Brady Statistic AS VP Percent: 2.91 %
Brady Statistic AS VS Percent: 15.31 %
Brady Statistic RA Percent Paced: 80.71 %
Brady Statistic RV Percent Paced: 4.36 %
Date Time Interrogation Session: 20211028104227
HighPow Impedance: 37 Ohm
HighPow Impedance: 43 Ohm
Implantable Lead Implant Date: 20091103
Implantable Lead Implant Date: 20091103
Implantable Lead Location: 753859
Implantable Lead Location: 753860
Implantable Lead Model: 5076
Implantable Lead Model: 6947
Implantable Pulse Generator Implant Date: 20201028
Lead Channel Impedance Value: 361 Ohm
Lead Channel Impedance Value: 399 Ohm
Lead Channel Impedance Value: 399 Ohm
Lead Channel Pacing Threshold Amplitude: 0.5 V
Lead Channel Pacing Threshold Amplitude: 1.375 V
Lead Channel Pacing Threshold Pulse Width: 0.4 ms
Lead Channel Pacing Threshold Pulse Width: 0.4 ms
Lead Channel Sensing Intrinsic Amplitude: 2.5 mV
Lead Channel Sensing Intrinsic Amplitude: 2.5 mV
Lead Channel Sensing Intrinsic Amplitude: 8.375 mV
Lead Channel Sensing Intrinsic Amplitude: 8.375 mV
Lead Channel Setting Pacing Amplitude: 1.5 V
Lead Channel Setting Pacing Amplitude: 2.75 V
Lead Channel Setting Pacing Pulse Width: 0.6 ms
Lead Channel Setting Sensing Sensitivity: 0.3 mV

## 2020-06-28 ENCOUNTER — Other Ambulatory Visit (HOSPITAL_COMMUNITY): Payer: Self-pay | Admitting: Unknown Physician Specialty

## 2020-06-28 ENCOUNTER — Other Ambulatory Visit: Payer: Self-pay | Admitting: Endocrinology

## 2020-06-28 DIAGNOSIS — Z7901 Long term (current) use of anticoagulants: Secondary | ICD-10-CM

## 2020-06-28 DIAGNOSIS — Z95811 Presence of heart assist device: Secondary | ICD-10-CM

## 2020-06-28 DIAGNOSIS — E890 Postprocedural hypothyroidism: Secondary | ICD-10-CM

## 2020-07-02 ENCOUNTER — Encounter (HOSPITAL_COMMUNITY): Payer: Medicare Other

## 2020-07-02 NOTE — Progress Notes (Signed)
Remote ICD transmission.   

## 2020-07-03 ENCOUNTER — Ambulatory Visit (HOSPITAL_COMMUNITY): Payer: Self-pay | Admitting: Pharmacist

## 2020-07-03 ENCOUNTER — Ambulatory Visit (HOSPITAL_COMMUNITY)
Admission: RE | Admit: 2020-07-03 | Discharge: 2020-07-03 | Disposition: A | Discharge status: Home / Self Care | Payer: Medicare Other | Source: Ambulatory Visit | Attending: Internal Medicine | Admitting: Internal Medicine

## 2020-07-03 ENCOUNTER — Other Ambulatory Visit: Payer: Self-pay

## 2020-07-03 DIAGNOSIS — Z7901 Long term (current) use of anticoagulants: Secondary | ICD-10-CM | POA: Insufficient documentation

## 2020-07-03 DIAGNOSIS — Z95811 Presence of heart assist device: Secondary | ICD-10-CM | POA: Insufficient documentation

## 2020-07-03 LAB — PROTIME-INR
INR: 2.1 — ABNORMAL HIGH (ref 0.8–1.2)
Prothrombin Time: 22.5 seconds — ABNORMAL HIGH (ref 11.4–15.2)

## 2020-07-03 MED ORDER — WARFARIN SODIUM 5 MG PO TABS
ORAL_TABLET | ORAL | 6 refills | Status: DC
Start: 2020-07-03 — End: 2020-07-05

## 2020-07-03 NOTE — Progress Notes (Signed)
LVAD INR 

## 2020-07-04 ENCOUNTER — Encounter: Payer: Self-pay | Admitting: Cardiology

## 2020-07-04 ENCOUNTER — Ambulatory Visit (INDEPENDENT_AMBULATORY_CARE_PROVIDER_SITE_OTHER): Payer: Medicare Other | Admitting: Cardiology

## 2020-07-04 VITALS — BP 110/80 | HR 60 | Ht 67.0 in | Wt 168.0 lb

## 2020-07-04 DIAGNOSIS — I472 Ventricular tachycardia, unspecified: Secondary | ICD-10-CM

## 2020-07-04 DIAGNOSIS — I255 Ischemic cardiomyopathy: Secondary | ICD-10-CM

## 2020-07-04 NOTE — Progress Notes (Signed)
Electrophysiology Office Note   Date:  07/04/2020   ID:  Johnny Oneill, DOB 05-Oct-1940, MRN [id number]  PCP:  Lavone Orn, MD  Cardiologist:  [place] Primary Electrophysiologist:  Holden Maniscalco Meredith Leeds, MD    Chief Complaint: CHF   History of Present Illness: Johnny Oneill is a 79 y.o. male who is being seen today for the evaluation of CHF at the request of Lavone Orn, MD. Presenting today for electrophysiology evaluation.  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  He was admitted to the hospital October 2020 after an episode of syncope.  He was found to be in ventricular tachycardia and had an ICD generator change 04/20/2019.  This was complicated by device site hematoma.  EP presented emergency room after an episode of syncope 06/09/2020 and was found to be in ventricular tach.  There was an attempt made to pace him out of ventricular tachycardia, but unfortunately this increases tachycardia rate and he received an ICD shock.  He has since been started on sotalol.  Today, denies symptoms of palpitations, chest pain, shortness of breath, orthopnea, PND, lower extremity edema, claudication, dizziness, presyncope, syncope, bleeding, or neurologic sequela. The patient is tolerating medications without difficulties.  Since starting sotalol he has done well.  He has had no further ICD shocks.  His ventricular arrhythmia burden is significantly reduced down to 0%.   Past Medical History:  Diagnosis Date  . Anxiety   . Automatic implantable cardioverter-defibrillator in situ   . CAD (coronary artery disease)    S/P stenting of LCX in 2004;  s/p Lat MI 2009 with occlusion of the LCX - treated with Promus stenting. Has total occlusion of the RCA.   [name] CHF (congestive heart failure) ([place])   . Hypercholesteremia   . Hypertension   . Hypothyroidism   . ICD (implantable cardiac  defibrillator) in place    PROPHYLACTIC      medtronic  . Inferior MI ([place]) "? date"; 2009  . Ischemic cardiomyopathy    a. 10/09 Echo: Sev LV Dysfxn, inf/lat AK, Mod MR.  . Liver disease   . LVAD (left ventricular assist device) present ([place])   . Osteoarthritis    a. s/p R TKR  . Pacemaker   . PVC (premature ventricular contraction)   . RBBB   . Shortness of breath    Past Surgical History:  Procedure Laterality Date  . CARPAL TUNNEL RELEASE Right 05/29/2016   Procedure: CARPAL TUNNEL RELEASE;  Surgeon: Earlie Server, MD;  Location: [place];  Service: Orthopedics;  Laterality: Right;  . CORONARY ANGIOPLASTY WITH STENT PLACEMENT  2004  . CORONARY ANGIOPLASTY WITH STENT PLACEMENT  07/2008  . DEFIBRILLATOR  2009  . ESOPHAGOGASTRODUODENOSCOPY N/A 09/15/2013   Procedure: ESOPHAGOGASTRODUODENOSCOPY (EGD);  Surgeon: Ladene Artist, MD;  Location: [facility] ENDOSCOPY;  Service: Endoscopy;  Laterality: N/A;  bedside  . HEMATOMA EVACUATION Right 11/06/2013   Procedure: EVACUATION HEMATOMA;  Surgeon: Gaye Pollack, MD;  Location: [place];  Service: Cardiothoracic;  Laterality: Right;  . ICD GENERATOR CHANGEOUT N/A 06/28/2019   Procedure: ICD GENERATOR CHANGEOUT;  Surgeon: Constance Haw, MD;  Location: [place] CV LAB;  Service: Cardiovascular;  Laterality: N/A;  . INSERT / REPLACE / REMOVE PACEMAKER  2009  . INSERTION OF IMPLANTABLE LEFT VENTRICULAR ASSIST DEVICE N/A 09/18/2013   Procedure: INSERTION OF IMPLANTABLE LEFT VENTRICULAR ASSIST DEVICE;  Surgeon: Tharon Aquas Trigt,  MD;  Location: [facility] OR;  Service: Open Heart Surgery;  Laterality: N/A;  CIRC ARREST  NITRIC OXIDE  . INTRA-AORTIC BALLOON PUMP INSERTION N/A 09/14/2013   Procedure: INTRA-AORTIC BALLOON PUMP INSERTION;  Surgeon: Jolaine Artist, MD;  Location: [facility] CATH LAB;  Service: Cardiovascular;  Laterality: N/A;  . INTRAOPERATIVE TRANSESOPHAGEAL ECHOCARDIOGRAM N/A 09/18/2013   Procedure: INTRAOPERATIVE TRANSESOPHAGEAL ECHOCARDIOGRAM;   Surgeon: Ivin Poot, MD;  Location: [place];  Service: Open Heart Surgery;  Laterality: N/A;  . KNEE ARTHROSCOPY     bilaterally  . KNEE ARTHROSCOPY W/ PARTIAL MEDIAL MENISCECTOMY  12/2002   left  . LEFT VENTRICULAR ASSIST DEVICE    . RIGHT HEART CATHETERIZATION N/A 08/25/2013   Procedure: RIGHT HEART CATH;  Surgeon: Jolaine Artist, MD;  Location: [facility] CATH LAB;  Service: Cardiovascular;  Laterality: N/A;  . RIGHT HEART CATHETERIZATION N/A 09/13/2013   Procedure: RIGHT HEART CATH;  Surgeon: Jolaine Artist, MD;  Location: [facility] CATH LAB;  Service: Cardiovascular;  Laterality: N/A;  . RIGHT HEART CATHETERIZATION N/A 02/08/2014   Procedure: RIGHT HEART CATH;  Surgeon: Jolaine Artist, MD;  Location: [facility] CATH LAB;  Service: Cardiovascular;  Laterality: N/A;  . RIGHT HEART CATHETERIZATION N/A 02/12/2014   Procedure: RIGHT HEART CATH;  Surgeon: Jolaine Artist, MD;  Location: [facility] CATH LAB;  Service: Cardiovascular;  Laterality: N/A;  . TOTAL KNEE ARTHROPLASTY  11/27/11   left  . TOTAL KNEE ARTHROPLASTY  11/27/2011   Procedure: TOTAL KNEE ARTHROPLASTY;  Surgeon: Yvette Rack., MD;  Location: [place];  Service: Orthopedics;  Laterality: Left;  left total knee arthroplasty  . VIDEO ASSISTED THORACOSCOPY Right 11/06/2013   Procedure: VIDEO ASSISTED THORACOSCOPY;  Surgeon: Gaye Pollack, MD;  Location: [facility] OR;  Service: Cardiothoracic;  Laterality: Right;     Current Outpatient Medications  Medication Sig Dispense Refill  . acetaminophen (TYLENOL) 500 MG chewable tablet Chew 500 mg by mouth every 6 (six) hours as needed for pain.    [name] aspirin EC 81 MG tablet Take 81 mg by mouth daily.    . cholecalciferol (VITAMIN D3) 25 MCG (1000 UT) tablet Take 1,000 Units by mouth daily.    . citalopram (CELEXA) 10 MG tablet Take 1 tablet by mouth once daily 90 tablet 3  . Coenzyme Q10 (COQ10) 200 MG CAPS Take 200 mg by mouth daily.    Arna Medici 137 MCG tablet TAKE ONE-HALF TABLET BY MOUTH ON SUNDAY, THEN  TAKE 1 TABLET BY MOUTH ON ALL OTHER DAYS 30 tablet 0  . famotidine (PEPCID) 20 MG tablet Take 20 mg by mouth daily.     . furosemide (LASIX) 20 MG tablet Take 20 mg by mouth daily as needed (Take 20 mg as daily as needed for wt > 153 lbs). Reported on 01/14/2016    . hydrALAZINE (APRESOLINE) 50 MG tablet TAKE 1 TABLET BY MOUTH THREE TIMES DAILY 270 tablet 3  . Melatonin 3 MG TABS Take 1 tablet by mouth at bedtime.    . pantoprazole (PROTONIX) 40 MG tablet Take 1 tablet by mouth once daily 90 tablet 0  . potassium chloride SA (K-DUR,KLOR-CON) 20 MEQ tablet Take 1 tablet (20 mEq total) by mouth daily as needed. Take one potassium pill when you take your lasix. 90 tablet 3  . Probiotic Product (PROBIOTIC PO) Take 1 capsule by mouth daily.    . rosuvastatin (CRESTOR) 20 MG tablet Take 1 tablet by mouth once daily 90 tablet 3  . sotalol (BETAPACE)  80 MG tablet Take 1 tablet (80 mg total) by mouth daily. 30 tablet 0  . spironolactone (ALDACTONE) 25 MG tablet Take 1 tablet (25 mg total) by mouth daily. 90 tablet 3  . traZODone (DESYREL) 50 MG tablet Take 1 tablet (50 mg total) by mouth at bedtime. 30 tablet 5  . warfarin (COUMADIN) 5 MG tablet Take 5 mg every Monday and 7.5 mg all other days or as directed by HF Clinic 90 tablet 6   No current facility-administered medications for this visit.   Facility-Administered Medications Ordered in Other Visits  Medication Dose Route Frequency Provider Last Rate Last Admin  . etomidate (AMIDATE) injection    Anesthesia Intra-op Myna Bright, CRNA   20 mg at 02/08/14 0915  . rocuronium [facility]) injection    Anesthesia Intra-op Myna Bright, CRNA   50 mg at 02/08/14 0932  . succinylcholine (ANECTINE) injection    Anesthesia Intra-op Myna Bright, CRNA   100 mg at 02/08/14 0915    Allergies:   Ace inhibitors, Diovan [valsartan], Lipitor [atorvastatin calcium], and Norvasc [amlodipine]   Social History:  The patient  reports that he has never  smoked. He has never used smokeless tobacco. He reports that he does not drink alcohol and does not use drugs.   Family History:  The patient's family history includes Coronary artery disease in his mother; Heart disease in his mother; Heart failure in his father; Hyperlipidemia in his sister; Stroke in his father.   ROS:  Please see the history of present illness.   Otherwise, review of systems is positive for none.   All other systems are reviewed and negative.   PHYSICAL EXAM: VS:  BP 110/80   Pulse 60   Ht 5\' 7"  (1.702 m)   Wt 168 lb (76.2 kg)   SpO2 97%   BMI 26.31 kg/m  , BMI Body mass index is 26.31 kg/m. GEN: Well nourished, well developed, in no acute distress  HEENT: normal  Neck: no JVD, carotid bruits, or masses Cardiac: LVAD hum, RRR; no murmurs, rubs, or gallops,no edema  Respiratory:  clear to auscultation bilaterally, normal work of breathing GI: soft, nontender, nondistended, + BS MS: no deformity or atrophy  Skin: warm and dry, device site well healed Neuro:  Strength and sensation are intact Psych: euthymic mood, full affect  EKG:  EKG is not ordered today. Personal review of the ekg ordered 06/17/20 shows atrial paced, right bundle branch block, QTC 536 ms  Personal review of the device interrogation today. Results in [facility]: 06/04/2020: TSH 1.33 06/09/2020: ALT 18; Hemoglobin 15.5; Platelets 212 06/12/2020: BUN 24; Creatinine, Ser 1.82; Potassium 4.1; Sodium 139 06/24/2020: Magnesium 2.3    Lipid Panel     Component Value Date/Time   CHOL 93 05/28/2015 0943   TRIG 112.0 05/28/2015 0943   HDL 41.50 05/28/2015 0943   CHOLHDL 2 05/28/2015 0943   VLDL 22.4 05/28/2015 0943   LDLCALC 29 05/28/2015 0943     Wt Readings from Last 3 Encounters:  07/04/20 168 lb (76.2 kg)  06/12/20 166 lb 3.2 oz (75.4 kg)  06/09/20 165 lb 5.5 oz (75 kg)      Other studies Reviewed: Additional studies/ records that were reviewed today include: TTE  06/28/19  Review of the above records today demonstrates:  1. Left ventricular ejection fraction, by visual estimation, is <20%. The  left ventricle has severely decreased function. Left ventricular septal  wall thickness was normal. Normal  left ventricular posterior wall  thickness. There is no left ventricular  hypertrophy.  2. LVAD: LVAD inflow cannula well-seated and without septal  contact.summary.  3. Left ventricular diastolic parameters are consistent with Grade I  diastolic dysfunction (impaired relaxation).  4. Moderately dilated left ventricular internal cavity size.  5. Global right ventricle has moderately reduced systolic function.The  right ventricular size is normal. No increase in right ventricular wall  thickness.  6. Left atrial size was mildly dilated.  7. Right atrial size was normal.  8. The mitral valve is normal in structure. Moderate mitral valve  regurgitation. No evidence of mitral stenosis.  9. The tricuspid valve is normal in structure. Tricuspid valve  regurgitation is trivial.  10. Aortic valve regurgitation is moderate.  11. The aortic valve is tricuspid. Aortic valve regurgitation is moderate.  No evidence of aortic valve sclerosis or stenosis.  12. Moderate, continuous aortic regurgitation.  13. The pulmonic valve was normal in structure. Pulmonic valve  regurgitation is not visualized.  14. Mildly elevated pulmonary artery systolic pressure.  15. The inferior vena cava is dilated in size with >50% respiratory  variability, suggesting right atrial pressure of 8 mmHg.    ASSESSMENT AND PLAN:  1.  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  He has a Medtronic ICD.  Device is functioning appropriately.  Currently on sotalol (monitoring for high risk medication).  He did have recurrent ICD shocks and thus his sotalol was started. His QTC remained stable on sotalol.  He has had no arrhythmias since starting his sotalol.  No  changes.  2.  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  Followed by heart failure cardiology.  Current medicines are reviewed at length with the patient today.   The patient does not have concerns regarding his medicines.  The following changes were made today: None  Labs/ tests ordered today include:  No orders of the defined types were placed in this encounter.    Disposition:   FU with Adair Lemar 6 months  Signed, Billie Trager Meredith Leeds, MD  07/04/2020 3:46 PM     [place] [address] [place] [address] [phone] (office) [phone] (fax)

## 2020-07-05 ENCOUNTER — Other Ambulatory Visit (HOSPITAL_COMMUNITY): Payer: Self-pay | Admitting: Unknown Physician Specialty

## 2020-07-05 MED ORDER — WARFARIN SODIUM 5 MG PO TABS: ORAL_TABLET | ORAL | 6 refills | Status: DC

## 2020-07-09 ENCOUNTER — Other Ambulatory Visit (HOSPITAL_COMMUNITY): Payer: Self-pay | Admitting: *Deleted

## 2020-07-09 DIAGNOSIS — Z7901 Long term (current) use of anticoagulants: Secondary | ICD-10-CM

## 2020-07-09 DIAGNOSIS — Z95811 Presence of heart assist device: Secondary | ICD-10-CM

## 2020-07-09 DIAGNOSIS — I472 Ventricular tachycardia, unspecified: Secondary | ICD-10-CM

## 2020-07-09 MED ORDER — SOTALOL HCL 80 MG PO TABS: 80.0000 mg | ORAL_TABLET | Freq: Every day | ORAL | 3 refills | Status: DC

## 2020-07-09 MED ORDER — WARFARIN SODIUM 5 MG PO TABS: ORAL_TABLET | ORAL | 11 refills | Status: DC

## 2020-07-10 ENCOUNTER — Other Ambulatory Visit (HOSPITAL_COMMUNITY): Payer: Self-pay | Admitting: Unknown Physician Specialty

## 2020-07-10 DIAGNOSIS — I472 Ventricular tachycardia, unspecified: Secondary | ICD-10-CM

## 2020-07-10 DIAGNOSIS — Z7901 Long term (current) use of anticoagulants: Secondary | ICD-10-CM

## 2020-07-10 DIAGNOSIS — Z95811 Presence of heart assist device: Secondary | ICD-10-CM

## 2020-07-10 MED ORDER — SOTALOL HCL 80 MG PO TABS: 80.0000 mg | ORAL_TABLET | Freq: Every day | ORAL | 3 refills | Status: DC

## 2020-07-10 MED FILL — SOTALOL HCL 80 MG TAB: 80 | 30 days supply | Qty: 30 | Fill #0

## 2020-07-11 ENCOUNTER — Other Ambulatory Visit (HOSPITAL_COMMUNITY): Payer: Self-pay | Admitting: Unknown Physician Specialty

## 2020-07-11 DIAGNOSIS — Z7901 Long term (current) use of anticoagulants: Secondary | ICD-10-CM

## 2020-07-11 DIAGNOSIS — Z95811 Presence of heart assist device: Secondary | ICD-10-CM

## 2020-07-16 ENCOUNTER — Ambulatory Visit (HOSPITAL_COMMUNITY): Payer: Self-pay | Admitting: Pharmacist

## 2020-07-16 ENCOUNTER — Ambulatory Visit (HOSPITAL_COMMUNITY)
Admission: RE | Admit: 2020-07-16 | Discharge: 2020-07-16 | Disposition: A | Discharge status: Home / Self Care | Payer: Medicare Other | Source: Ambulatory Visit | Attending: Internal Medicine | Admitting: Internal Medicine

## 2020-07-16 ENCOUNTER — Other Ambulatory Visit: Payer: Self-pay

## 2020-07-16 DIAGNOSIS — H01115 Allergic dermatitis of left lower eyelid: Secondary | ICD-10-CM | POA: Diagnosis not present

## 2020-07-16 DIAGNOSIS — Z7901 Long term (current) use of anticoagulants: Secondary | ICD-10-CM | POA: Diagnosis not present

## 2020-07-16 DIAGNOSIS — Z95811 Presence of heart assist device: Secondary | ICD-10-CM | POA: Insufficient documentation

## 2020-07-16 DIAGNOSIS — H00015 Hordeolum externum left lower eyelid: Secondary | ICD-10-CM | POA: Diagnosis not present

## 2020-07-16 LAB — PROTIME-INR
INR: 3 — ABNORMAL HIGH (ref 0.8–1.2)
Prothrombin Time: 30.3 seconds — ABNORMAL HIGH (ref 11.4–15.2)

## 2020-07-16 LAB — LACTATE DEHYDROGENASE: LDH: 231 U/L — ABNORMAL HIGH (ref 98–192)

## 2020-07-16 NOTE — Progress Notes (Signed)
LVAD INR 

## 2020-07-31 ENCOUNTER — Other Ambulatory Visit: Payer: Self-pay | Admitting: Endocrinology

## 2020-07-31 ENCOUNTER — Other Ambulatory Visit: Payer: Self-pay | Admitting: *Deleted

## 2020-07-31 DIAGNOSIS — E890 Postprocedural hypothyroidism: Secondary | ICD-10-CM

## 2020-07-31 MED ORDER — LEVOTHYROXINE SODIUM 137 MCG PO TABS: ORAL_TABLET | ORAL | 1 refills | Status: DC

## 2020-08-02 ENCOUNTER — Other Ambulatory Visit (HOSPITAL_COMMUNITY): Payer: Self-pay | Admitting: *Deleted

## 2020-08-02 DIAGNOSIS — Z95811 Presence of heart assist device: Secondary | ICD-10-CM

## 2020-08-02 DIAGNOSIS — Z7901 Long term (current) use of anticoagulants: Secondary | ICD-10-CM

## 2020-08-05 ENCOUNTER — Ambulatory Visit (HOSPITAL_COMMUNITY)
Admission: RE | Admit: 2020-08-05 | Discharge: 2020-08-05 | Disposition: A | Discharge status: Home / Self Care | Payer: Medicare Other | Source: Ambulatory Visit | Attending: Internal Medicine | Admitting: Internal Medicine

## 2020-08-05 ENCOUNTER — Ambulatory Visit (HOSPITAL_COMMUNITY): Payer: Self-pay | Admitting: Pharmacist

## 2020-08-05 ENCOUNTER — Other Ambulatory Visit: Payer: Self-pay

## 2020-08-05 ENCOUNTER — Encounter (HOSPITAL_COMMUNITY): Payer: Self-pay

## 2020-08-05 VITALS — BP 81/69 | HR 59 | Wt 169.0 lb

## 2020-08-05 DIAGNOSIS — Z7901 Long term (current) use of anticoagulants: Secondary | ICD-10-CM | POA: Insufficient documentation

## 2020-08-05 DIAGNOSIS — I252 Old myocardial infarction: Secondary | ICD-10-CM | POA: Insufficient documentation

## 2020-08-05 DIAGNOSIS — I130 Hypertensive heart and chronic kidney disease with heart failure and stage 1 through stage 4 chronic kidney disease, or unspecified chronic kidney disease: Secondary | ICD-10-CM | POA: Diagnosis not present

## 2020-08-05 DIAGNOSIS — I2510 Atherosclerotic heart disease of native coronary artery without angina pectoris: Secondary | ICD-10-CM | POA: Insufficient documentation

## 2020-08-05 DIAGNOSIS — E039 Hypothyroidism, unspecified: Secondary | ICD-10-CM | POA: Diagnosis not present

## 2020-08-05 DIAGNOSIS — N1832 Chronic kidney disease, stage 3b: Secondary | ICD-10-CM | POA: Diagnosis not present

## 2020-08-05 DIAGNOSIS — I5022 Chronic systolic (congestive) heart failure: Secondary | ICD-10-CM | POA: Diagnosis not present

## 2020-08-05 DIAGNOSIS — I255 Ischemic cardiomyopathy: Secondary | ICD-10-CM | POA: Insufficient documentation

## 2020-08-05 DIAGNOSIS — R42 Dizziness and giddiness: Secondary | ICD-10-CM | POA: Diagnosis not present

## 2020-08-05 DIAGNOSIS — Z95811 Presence of heart assist device: Secondary | ICD-10-CM | POA: Diagnosis not present

## 2020-08-05 DIAGNOSIS — Z79899 Other long term (current) drug therapy: Secondary | ICD-10-CM | POA: Insufficient documentation

## 2020-08-05 DIAGNOSIS — R748 Abnormal levels of other serum enzymes: Secondary | ICD-10-CM | POA: Diagnosis not present

## 2020-08-05 DIAGNOSIS — I472 Ventricular tachycardia, unspecified: Secondary | ICD-10-CM

## 2020-08-05 DIAGNOSIS — E785 Hyperlipidemia, unspecified: Secondary | ICD-10-CM | POA: Diagnosis not present

## 2020-08-05 DIAGNOSIS — Z7982 Long term (current) use of aspirin: Secondary | ICD-10-CM | POA: Diagnosis not present

## 2020-08-05 DIAGNOSIS — Z950 Presence of cardiac pacemaker: Secondary | ICD-10-CM | POA: Insufficient documentation

## 2020-08-05 LAB — COMPREHENSIVE METABOLIC PANEL
ALT: 16 U/L (ref 0–44)
AST: 24 U/L (ref 15–41)
Albumin: 3.6 g/dL (ref 3.5–5.0)
Alkaline Phosphatase: 68 U/L (ref 38–126)
Anion gap: 9 (ref 5–15)
BUN: 25 mg/dL — ABNORMAL HIGH (ref 8–23)
CO2: 25 mmol/L (ref 22–32)
Calcium: 9.4 mg/dL (ref 8.9–10.3)
Chloride: 103 mmol/L (ref 98–111)
Creatinine, Ser: 1.65 mg/dL — ABNORMAL HIGH (ref 0.61–1.24)
GFR, Estimated: 43 mL/min — ABNORMAL LOW (ref >60)
Glucose, Bld: 68 mg/dL — ABNORMAL LOW (ref 70–99)
Potassium: 4.2 mmol/L (ref 3.5–5.1)
Sodium: 137 mmol/L (ref 135–145)
Total Bilirubin: 0.9 mg/dL (ref 0.3–1.2)
Total Protein: 6.5 g/dL (ref 6.5–8.1)

## 2020-08-05 LAB — PROTIME-INR
INR: 3 — ABNORMAL HIGH (ref 0.8–1.2)
Prothrombin Time: 30.5 seconds — ABNORMAL HIGH (ref 11.4–15.2)

## 2020-08-05 LAB — CBC
HCT: 38.2 % — ABNORMAL LOW (ref 39.0–52.0)
Hemoglobin: 12.9 g/dL — ABNORMAL LOW (ref 13.0–17.0)
MCH: 31.2 pg (ref 26.0–34.0)
MCHC: 33.8 g/dL (ref 30.0–36.0)
MCV: 92.3 fL (ref 80.0–100.0)
Platelets: 165 10*3/uL (ref 150–400)
RBC: 4.14 MIL/uL — ABNORMAL LOW (ref 4.22–5.81)
RDW: 14.6 % (ref 11.5–15.5)
WBC: 5.1 10*3/uL (ref 4.0–10.5)
nRBC: 0 % (ref 0.0–0.2)

## 2020-08-05 LAB — MAGNESIUM: Magnesium: 2 mg/dL (ref 1.7–2.4)

## 2020-08-05 LAB — LACTATE DEHYDROGENASE: LDH: 222 U/L — ABNORMAL HIGH (ref 98–192)

## 2020-08-05 NOTE — Progress Notes (Signed)
VAD CLINIC NOTE   HPI:  Johnny Oneill is a 79 y.o. 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   Admitted 3/4-3/25/15 with SOB and syncopal episode. Found to have SBO and NG placed and started on TPN. On 11/06/13 developed progressive SOB and hypotension and co-ox 51% and Hgb 6.7. Started on milrinone and transfused. Echo showed nl RV function. Patient had PEA arrest and was intubated and started on pressors. CXR revealed a R hemothorax and a R CT placed. He required VATs and evacuation of hemothorax and was then teansferred to CIR.   LVAD Issues  Elevated LDH--bivalirudin. D/C LDH 188  Presented to ER on 06/27/19 with syncopal episode and head injury from fall. ECG showed VT 240. Underwent emergent DC-CV in ER. Head CT with no bleed. Was admitted and had IC gen replacement (EOL). Had small hematoma at site. Amio started. Post-discharge found to have episode of AFL on ICD remote check but has since resolved.    On 04/30/20 he was seen in the VAD clinic and spiro was stopped and jardiance was started.  He reports increased urine output after starting.   Had ICD shock for VT on 06/09/20. Started on sotalol   Returns for routine f/u. Feels good. Mildly lightheaded at times when working at [employer]. Denies recurrent ICD shock. Says he is not drinking enough.  Denies orthopnea or PND. No fevers, chills or problems with driveline. No bleeding, melena or neuro symptoms. No VAD alarms. Taking all meds as prescribed.   ICD interrogation: No recurrent VY. Optivol low. Personally reviewed  VAD Indication: Destination therapy- patient choice    VAD interrogation & Equipment Management: Speed: 9200 Flow:  4.6 Power: 5.2w PI: 5.4  Alarms: none Events: 5-10 PI events daily  Fixed speed 9200  Low speed limit: 8600  Primary Controller:  Replace back up battery in 26 months Back up controller: Replace  back up battery - pt did not bring  Annual Equipment Maintenance on UBC/PM was performed on 10/2019.   Exit Site Care: Drive line is being maintained weekly by [name], his wife. Dressing dry and intact. Stabilization device present and accurately applied. Pt denies fever or chills. Pt has CHG allergy. Pt has sufficient dressing kits at home.     Past Medical History:  Diagnosis Date  . Anxiety   . Automatic implantable cardioverter-defibrillator in situ   . CAD (coronary artery disease)    S/P stenting of LCX in 2004;  s/p Lat MI 2009 with occlusion of the LCX - treated with Promus stenting. Has total occlusion of the RCA.   [name] CHF (congestive heart failure) ([place])   . Hypercholesteremia   . Hypertension   . Hypothyroidism   . ICD (implantable cardiac defibrillator) in place    PROPHYLACTIC      medtronic  . Inferior MI ([place]) "? date"; 2009  . Ischemic cardiomyopathy    a. 10/09 Echo: Sev LV Dysfxn, inf/lat AK, Mod MR.  . Liver disease   . LVAD (left ventricular assist device) present ([place])   . Osteoarthritis    a. s/p R TKR  . Pacemaker   . PVC (premature ventricular contraction)   . RBBB   . Shortness of breath     Current Outpatient Medications  Medication Sig Dispense Refill  . acetaminophen (TYLENOL) 500 MG chewable tablet Chew 500 mg by mouth every 6 (six) hours as needed  for pain.    [name] aspirin EC 81 MG tablet Take 81 mg by mouth daily.    . cholecalciferol (VITAMIN D3) 25 MCG (1000 UT) tablet Take 1,000 Units by mouth daily.    . citalopram (CELEXA) 10 MG tablet Take 1 tablet by mouth once daily 90 tablet 3  . Coenzyme Q10 (COQ10) 200 MG CAPS Take 200 mg by mouth daily.    Arna Medici 137 MCG tablet TAKE ONE-HALF TABLET BY MOUTH ON SUNDAY, THEN TAKE 1 TABLET BY MOUTH ON ALL OTHER DAYS 105 tablet 1  . famotidine (PEPCID) 20 MG tablet Take 20 mg by mouth daily.     . hydrALAZINE (APRESOLINE) 50 MG tablet TAKE 1 TABLET BY MOUTH THREE TIMES DAILY 270 tablet 3  .  Melatonin 3 MG TABS Take 1 tablet by mouth at bedtime.    . pantoprazole (PROTONIX) 40 MG tablet Take 1 tablet by mouth once daily 90 tablet 0  . Probiotic Product (PROBIOTIC PO) Take 1 capsule by mouth daily.    . rosuvastatin (CRESTOR) 20 MG tablet Take 1 tablet by mouth once daily 90 tablet 3  . sotalol (BETAPACE) 80 MG tablet Take 1 tablet (80 mg total) by mouth daily. 90 tablet 3  . spironolactone (ALDACTONE) 25 MG tablet Take 1 tablet (25 mg total) by mouth daily. 90 tablet 3  . traZODone (DESYREL) 50 MG tablet Take 1 tablet (50 mg total) by mouth at bedtime. 30 tablet 5  . warfarin (COUMADIN) 5 MG tablet Take 5 mg every Monday and 7.5 mg all other days or as directed by HF Clinic 135 tablet 11  . furosemide (LASIX) 20 MG tablet Take 20 mg by mouth daily as needed (Take 20 mg as daily as needed for wt > 153 lbs). Reported on 01/14/2016 (Patient not taking: Reported on 08/05/2020)    . potassium chloride SA (K-DUR,KLOR-CON) 20 MEQ tablet Take 1 tablet (20 mEq total) by mouth daily as needed. Take one potassium pill when you take your lasix. (Patient not taking: Reported on 08/05/2020) 90 tablet 3   No current facility-administered medications for this encounter.   Facility-Administered Medications Ordered in Other Encounters  Medication Dose Route Frequency Provider Last Rate Last Admin  . etomidate (AMIDATE) injection    Anesthesia Intra-op Myna Bright, CRNA   20 mg at 02/08/14 0915  . rocuronium [facility]) injection    Anesthesia Intra-op Myna Bright, CRNA   50 mg at 02/08/14 0932  . succinylcholine (ANECTINE) injection    Anesthesia Intra-op Myna Bright, CRNA   100 mg at 02/08/14 0915   Ace inhibitors, Diovan [valsartan], Lipitor [atorvastatin calcium], and Norvasc [amlodipine]  Review of systems complete and found to be negative unless listed in HPI.     Vital Signs:  Doppler Pressure: 84 Vitals:   08/05/20 1020 08/05/20 1021  BP: (!) 82/0 (!) 81/69  Pulse: (!)  59   SpO2: 99%   SPO2: 98% on room air.  RA  Vital Signs:  Temp:  Doppler Pressure: 82 Automatc BP: 81/69 (75) HR: 59 SPO2: 99% on RA  Weight: 169.0 lb w/ eqt Last weight: 166.2 lb    Physical Exam: General:  NAD.  HEENT: normal  Neck: supple. JVP not elevated.  Carotids 2+ bilat; no bruits. No lymphadenopathy or thryomegaly appreciated. Cor: LVAD hum.  Lungs: Clear. Abdomen:  soft, nontender, non-distended. No hepatosplenomegaly. No bruits or masses. Good bowel sounds. Driveline site clean. Anchor in place.  Extremities: no cyanosis, clubbing,  rash. Warm no edema  Neuro: alert & oriented x 3. No focal deficits. Moves all 4 without problem     ASSESSMENT AND PLAN:   1) Chronic systolic HF: ICM, EF 46-95% s/p LVAD HM II implant 08/2013 for DT.   - Echo 3/18 EF 10-15% mild AI. Mod RV dysfunction. LVAD cannula OK - Stable NYHA I. Appears a bit dry - Encouraged him to liberalize salt intak. - Continue spironolactone 25 mg daily.  - Failed Jardiance due to volume depletion - He has not tolerated ACE-I or amlodipine or b-blocker in the past.  2) LVAD: HMII 08/2013 for DT. - VAD interrogated personally. Parameters stable. - Admitted in May 2018 with elevated LDH with bival.  - Continue aspirin and coumadin.  - Hgb 12.9  stable - DL sit looks good - MAPs ok  3) VT - now s/p ICD gen change.  - Off amio due to cerebellar side effects.  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 Now on sotalol. ECG ok - No VT on ICD check today - Follows with Dr. Curt Bears - Keep K> 4.0 Mg > 2.0  4) Chronic anticoagulation: INR goal 2.0-3.0.    - INR 3.0. Discussed dosing with PharmD personally. - Continue ASA 81 mg for LVAD + warfarin.   5) HTN:  - Stable.   6) PAFL - Remains in NSR. On warfarin   7) Hypothyroidism:  - Followed by Dr Dwyane Dee. Stable.No change   8) Hyperlipidemia:  - Continue Crestor. Zetia stopped due to cost.  - No change.   9) CAD - no s/s ischemia  10) CKD IIIb -  Creatinine stable at 1.6  11) Elevated liver enzymes - normalized off amio. Liver u/s ok   Glori Bickers, MD  10:46 AM

## 2020-08-05 NOTE — Progress Notes (Signed)
LVAD INR 

## 2020-08-05 NOTE — Patient Instructions (Addendum)
1. No medication changes today 2. Coumadin dosing per Ander Purpura PharmD 3. Return to [place] clinic in 2 months for follow up. We will do your 7 year Intermacs with 6 minute walk and annual maintenance. Please bring your power module, battery charger, back up bag, and batteries for maintenance.

## 2020-08-05 NOTE — Progress Notes (Signed)
Patient presents for 2 month f/u visit in [facility] today alone. Reports no problems with VAD equipment or concerns with drive line.   Pt states he has been "feeling fine" over the last few weeks. 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 Orthostatics performed; see below.    Denies any ICD shocks. Pt confirms he is taking Sotalol 80 mg daily. EKG obtained; reviewed by Dr Haroldine Laws.   Vital Signs:  Temp:  Doppler Pressure: 82 Automatc BP: 81/69 (75) (laying)  102/84 (91) (sitting)   90/78 (84) (standing) HR: 59 SPO2: 99% on RA   Weight: 169.0 lb w/ eqt Last weight: 166.2 lb  VAD Indication: Destination therapy- patient choice     VAD interrogation & Equipment Management: Speed: 9200 Flow:  4.6 Power: 5.2w    PI: 5.4   Alarms: none Events: 5-10 PI events daily  Fixed speed 9200  Low speed limit: 8600   Primary Controller:  Replace back up battery in 26 months Back up controller:  Replace back up battery - pt did not bring   Annual Equipment Maintenance on UBC/PM was performed on 10/2019.   I reviewed the LVAD parameters from today and compared the results to the patient's prior recorded data. LVAD interrogation was NEGATIVE for significant power changes, NEGATIVE for clinical alarms and STABLE for PI events/speed drops. No programming changes were made and pump is functioning within specified parameters. Pt is performing daily controller and system monitor self tests along with completing weekly and monthly maintenance for LVAD equipment.  Exit Site Care: Drive line is being maintained weekly by [name], his wife. Dressing dry and intact. Stabilization device present and accurately applied. Pt denies fever or chills. Pt has CHG allergy. Pt has adequate dressing supplies at home.   Significant Events on VAD Support:  10/2013>SBO 12/2016> elevated  LDH, admit for Bival   Device: Dual Medtronic Therapies:  V therapy on at 222 bpm; V monitor on 171         A therapy on 171 bpm (ramp and burst overdrive paing); A monitor on 300 bpm  Pacing: AAI<=>DDD 60 bpm  Last check: 03/28/20  BP & Labs:  Doppler 82 - Doppler is reflecting modified systolic  Hgb 91.6 - No S/S of bleeding. Specifically denies melena/BRBPR or nosebleeds.   LDH 222- established baseline of 200- 350. Denies tea-colored urine. No power elevations noted on interrogation.   Patient Instructions:  1. No medication changes today 2. Coumadin dosing per Ander Purpura PharmD 3. Return to [place] clinic in 2 months for follow up. We will do your 7 year Intermacs with 6 minute walk and annual maintenance. Please bring your power module, battery charger, back up bag, and batteries for maintenance.  Emerson Monte RN [place] Coordinator  Office: [phone]  24/7 Pager: [phone]

## 2020-08-07 ENCOUNTER — Encounter (HOSPITAL_COMMUNITY): Payer: Medicare Other

## 2020-08-22 ENCOUNTER — Other Ambulatory Visit (HOSPITAL_COMMUNITY): Payer: Self-pay | Admitting: *Deleted

## 2020-08-22 ENCOUNTER — Other Ambulatory Visit (HOSPITAL_COMMUNITY): Payer: Self-pay | Admitting: Unknown Physician Specialty

## 2020-08-22 DIAGNOSIS — K219 Gastro-esophageal reflux disease without esophagitis: Secondary | ICD-10-CM

## 2020-08-22 DIAGNOSIS — Z95811 Presence of heart assist device: Secondary | ICD-10-CM

## 2020-08-22 DIAGNOSIS — Z7901 Long term (current) use of anticoagulants: Secondary | ICD-10-CM

## 2020-08-22 MED ORDER — SPIRONOLACTONE 25 MG PO TABS: 25.0000 mg | ORAL_TABLET | Freq: Every day | ORAL | 3 refills | Status: DC

## 2020-08-22 MED ORDER — PANTOPRAZOLE SODIUM 40 MG PO TBEC: 40.0000 mg | DELAYED_RELEASE_TABLET | Freq: Every day | ORAL | 3 refills | Status: DC

## 2020-08-27 ENCOUNTER — Ambulatory Visit (HOSPITAL_COMMUNITY)
Admission: RE | Admit: 2020-08-27 | Discharge: 2020-08-27 | Disposition: A | Discharge status: Home / Self Care | Payer: Medicare Other | Source: Ambulatory Visit | Attending: Cardiology | Admitting: Cardiology

## 2020-08-27 ENCOUNTER — Ambulatory Visit (HOSPITAL_COMMUNITY): Payer: Self-pay | Admitting: Pharmacist

## 2020-08-27 ENCOUNTER — Other Ambulatory Visit: Payer: Self-pay

## 2020-08-27 DIAGNOSIS — Z7901 Long term (current) use of anticoagulants: Secondary | ICD-10-CM | POA: Diagnosis not present

## 2020-08-27 DIAGNOSIS — Z95811 Presence of heart assist device: Secondary | ICD-10-CM | POA: Diagnosis not present

## 2020-08-27 LAB — PROTIME-INR
INR: 2.3 — ABNORMAL HIGH (ref 0.8–1.2)
Prothrombin Time: 24.8 seconds — ABNORMAL HIGH (ref 11.4–15.2)

## 2020-08-27 LAB — LACTATE DEHYDROGENASE: LDH: 257 U/L — ABNORMAL HIGH (ref 98–192)

## 2020-08-27 NOTE — Progress Notes (Signed)
LVAD INR 

## 2020-08-29 ENCOUNTER — Other Ambulatory Visit (HOSPITAL_COMMUNITY): Payer: Self-pay | Admitting: *Deleted

## 2020-08-29 DIAGNOSIS — E785 Hyperlipidemia, unspecified: Secondary | ICD-10-CM

## 2020-08-29 DIAGNOSIS — I5043 Acute on chronic combined systolic (congestive) and diastolic (congestive) heart failure: Secondary | ICD-10-CM

## 2020-08-29 MED ORDER — ROSUVASTATIN CALCIUM 20 MG PO TABS: 20.0000 mg | ORAL_TABLET | Freq: Every day | ORAL | 3 refills | Status: DC

## 2020-09-02 ENCOUNTER — Other Ambulatory Visit (HOSPITAL_COMMUNITY): Payer: Self-pay | Admitting: *Deleted

## 2020-09-02 DIAGNOSIS — I5043 Acute on chronic combined systolic (congestive) and diastolic (congestive) heart failure: Secondary | ICD-10-CM

## 2020-09-02 DIAGNOSIS — E785 Hyperlipidemia, unspecified: Secondary | ICD-10-CM

## 2020-09-02 MED ORDER — ROSUVASTATIN CALCIUM 20 MG PO TABS: 20.0000 mg | ORAL_TABLET | Freq: Every day | ORAL | 3 refills | Status: DC

## 2020-09-13 ENCOUNTER — Other Ambulatory Visit (HOSPITAL_COMMUNITY): Payer: Self-pay | Admitting: *Deleted

## 2020-09-13 DIAGNOSIS — Z7901 Long term (current) use of anticoagulants: Secondary | ICD-10-CM

## 2020-09-13 DIAGNOSIS — Z23 Encounter for immunization: Secondary | ICD-10-CM | POA: Diagnosis not present

## 2020-09-13 DIAGNOSIS — Z95811 Presence of heart assist device: Secondary | ICD-10-CM

## 2020-09-17 ENCOUNTER — Other Ambulatory Visit (HOSPITAL_COMMUNITY): Payer: Self-pay | Admitting: Unknown Physician Specialty

## 2020-09-17 ENCOUNTER — Ambulatory Visit (HOSPITAL_COMMUNITY): Payer: Self-pay | Admitting: Pharmacist

## 2020-09-17 ENCOUNTER — Other Ambulatory Visit: Payer: Self-pay

## 2020-09-17 ENCOUNTER — Ambulatory Visit (HOSPITAL_COMMUNITY)
Admission: RE | Admit: 2020-09-17 | Discharge: 2020-09-17 | Disposition: A | Discharge status: Home / Self Care | Payer: Medicare Other | Source: Ambulatory Visit | Attending: Internal Medicine | Admitting: Internal Medicine

## 2020-09-17 DIAGNOSIS — Z95811 Presence of heart assist device: Secondary | ICD-10-CM | POA: Insufficient documentation

## 2020-09-17 DIAGNOSIS — Z7901 Long term (current) use of anticoagulants: Secondary | ICD-10-CM | POA: Insufficient documentation

## 2020-09-17 LAB — PROTIME-INR
INR: 2.5 — ABNORMAL HIGH (ref 0.8–1.2)
Prothrombin Time: 25.9 seconds — ABNORMAL HIGH (ref 11.4–15.2)

## 2020-09-17 LAB — LACTATE DEHYDROGENASE: LDH: 189 U/L (ref 98–192)

## 2020-09-17 NOTE — Progress Notes (Signed)
LVAD INR 

## 2020-09-26 ENCOUNTER — Ambulatory Visit (INDEPENDENT_AMBULATORY_CARE_PROVIDER_SITE_OTHER): Payer: Medicare Other

## 2020-09-26 DIAGNOSIS — I472 Ventricular tachycardia, unspecified: Secondary | ICD-10-CM

## 2020-09-26 LAB — CUP PACEART REMOTE DEVICE CHECK
Battery Remaining Longevity: 97 mo
Battery Voltage: 3 V
Brady Statistic AP VP Percent: 6.86 %
Brady Statistic AP VS Percent: 65.71 %
Brady Statistic AS VP Percent: 2.46 %
Brady Statistic AS VS Percent: 24.96 %
Brady Statistic RA Percent Paced: 70.18 %
Brady Statistic RV Percent Paced: 9.02 %
Date Time Interrogation Session: 20220127044223
HighPow Impedance: 36 Ohm
HighPow Impedance: 45 Ohm
Implantable Lead Implant Date: 20091103
Implantable Lead Implant Date: 20091103
Implantable Lead Location: 753859
Implantable Lead Location: 753860
Implantable Lead Model: 5076
Implantable Lead Model: 6947
Implantable Pulse Generator Implant Date: 20201028
Lead Channel Impedance Value: 342 Ohm
Lead Channel Impedance Value: 399 Ohm
Lead Channel Impedance Value: 399 Ohm
Lead Channel Pacing Threshold Amplitude: 0.5 V
Lead Channel Pacing Threshold Amplitude: 1.5 V
Lead Channel Pacing Threshold Pulse Width: 0.4 ms
Lead Channel Pacing Threshold Pulse Width: 0.4 ms
Lead Channel Sensing Intrinsic Amplitude: 2.125 mV
Lead Channel Sensing Intrinsic Amplitude: 2.125 mV
Lead Channel Sensing Intrinsic Amplitude: 6.625 mV
Lead Channel Sensing Intrinsic Amplitude: 6.625 mV
Lead Channel Setting Pacing Amplitude: 1.5 V
Lead Channel Setting Pacing Amplitude: 2.75 V
Lead Channel Setting Pacing Pulse Width: 0.6 ms
Lead Channel Setting Sensing Sensitivity: 0.3 mV

## 2020-10-06 NOTE — Progress Notes (Signed)
Remote ICD transmission.   

## 2020-10-08 ENCOUNTER — Telehealth (HOSPITAL_COMMUNITY): Payer: Self-pay | Admitting: *Deleted

## 2020-10-08 NOTE — Telephone Encounter (Signed)
Received phone call from patient's wife stating pt has throwing up / having diarrhea since last night. She believes he may have food poisoning from chicken, that he said "tasted terrible", he ate at a funeral yesterday evening. Reports low grade temp between 99 - 100. Instructed to make sure he hydrates as much as he can. Instructed to call VAD coordinators if symptoms worsen or is unable to keep anything down. Follow up appointment scheduled for tomorrow rescheduled to 10/16/20 at 11:00. She verbalized understanding of all the above.   Emerson Monte RN [place] Coordinator  Office: [phone]  24/7 Pager: [phone]

## 2020-10-09 ENCOUNTER — Encounter (HOSPITAL_COMMUNITY): Payer: Medicare Other

## 2020-10-15 ENCOUNTER — Other Ambulatory Visit (HOSPITAL_COMMUNITY): Payer: Self-pay | Admitting: Unknown Physician Specialty

## 2020-10-15 DIAGNOSIS — I472 Ventricular tachycardia, unspecified: Secondary | ICD-10-CM

## 2020-10-15 DIAGNOSIS — Z95811 Presence of heart assist device: Secondary | ICD-10-CM

## 2020-10-15 DIAGNOSIS — Z7901 Long term (current) use of anticoagulants: Secondary | ICD-10-CM

## 2020-10-16 ENCOUNTER — Ambulatory Visit (HOSPITAL_COMMUNITY)
Admission: RE | Admit: 2020-10-16 | Discharge: 2020-10-16 | Disposition: A | Discharge status: Home / Self Care | Payer: Medicare Other | Source: Ambulatory Visit | Attending: Internal Medicine | Admitting: Internal Medicine

## 2020-10-16 ENCOUNTER — Ambulatory Visit (HOSPITAL_COMMUNITY): Payer: Self-pay | Admitting: Pharmacist

## 2020-10-16 ENCOUNTER — Other Ambulatory Visit: Payer: Self-pay

## 2020-10-16 VITALS — BP 72/0 | HR 59 | Ht 69.0 in | Wt 167.4 lb

## 2020-10-16 DIAGNOSIS — I480 Paroxysmal atrial fibrillation: Secondary | ICD-10-CM | POA: Insufficient documentation

## 2020-10-16 DIAGNOSIS — R531 Weakness: Secondary | ICD-10-CM | POA: Diagnosis not present

## 2020-10-16 DIAGNOSIS — R748 Abnormal levels of other serum enzymes: Secondary | ICD-10-CM | POA: Diagnosis not present

## 2020-10-16 DIAGNOSIS — I10 Essential (primary) hypertension: Secondary | ICD-10-CM | POA: Diagnosis not present

## 2020-10-16 DIAGNOSIS — E039 Hypothyroidism, unspecified: Secondary | ICD-10-CM | POA: Diagnosis not present

## 2020-10-16 DIAGNOSIS — I130 Hypertensive heart and chronic kidney disease with heart failure and stage 1 through stage 4 chronic kidney disease, or unspecified chronic kidney disease: Secondary | ICD-10-CM | POA: Insufficient documentation

## 2020-10-16 DIAGNOSIS — Z7901 Long term (current) use of anticoagulants: Secondary | ICD-10-CM | POA: Insufficient documentation

## 2020-10-16 DIAGNOSIS — I5022 Chronic systolic (congestive) heart failure: Secondary | ICD-10-CM | POA: Diagnosis not present

## 2020-10-16 DIAGNOSIS — Z7982 Long term (current) use of aspirin: Secondary | ICD-10-CM | POA: Diagnosis not present

## 2020-10-16 DIAGNOSIS — I2510 Atherosclerotic heart disease of native coronary artery without angina pectoris: Secondary | ICD-10-CM | POA: Insufficient documentation

## 2020-10-16 DIAGNOSIS — Z79899 Other long term (current) drug therapy: Secondary | ICD-10-CM | POA: Insufficient documentation

## 2020-10-16 DIAGNOSIS — E785 Hyperlipidemia, unspecified: Secondary | ICD-10-CM | POA: Diagnosis not present

## 2020-10-16 DIAGNOSIS — E869 Volume depletion, unspecified: Secondary | ICD-10-CM

## 2020-10-16 DIAGNOSIS — I472 Ventricular tachycardia, unspecified: Secondary | ICD-10-CM

## 2020-10-16 DIAGNOSIS — Z95810 Presence of automatic (implantable) cardiac defibrillator: Secondary | ICD-10-CM | POA: Diagnosis not present

## 2020-10-16 DIAGNOSIS — R42 Dizziness and giddiness: Secondary | ICD-10-CM | POA: Insufficient documentation

## 2020-10-16 DIAGNOSIS — N1832 Chronic kidney disease, stage 3b: Secondary | ICD-10-CM | POA: Diagnosis not present

## 2020-10-16 DIAGNOSIS — Z95811 Presence of heart assist device: Secondary | ICD-10-CM

## 2020-10-16 LAB — COMPREHENSIVE METABOLIC PANEL
ALT: 16 U/L (ref 0–44)
AST: 23 U/L (ref 15–41)
Albumin: 3.3 g/dL — ABNORMAL LOW (ref 3.5–5.0)
Alkaline Phosphatase: 72 U/L (ref 38–126)
Anion gap: 9 (ref 5–15)
BUN: 22 mg/dL (ref 8–23)
CO2: 24 mmol/L (ref 22–32)
Calcium: 8.5 mg/dL — ABNORMAL LOW (ref 8.9–10.3)
Chloride: 104 mmol/L (ref 98–111)
Creatinine, Ser: 1.86 mg/dL — ABNORMAL HIGH (ref 0.61–1.24)
GFR, Estimated: 37 mL/min — ABNORMAL LOW (ref >60)
Glucose, Bld: 83 mg/dL (ref 70–99)
Potassium: 3.9 mmol/L (ref 3.5–5.1)
Sodium: 137 mmol/L (ref 135–145)
Total Bilirubin: 0.7 mg/dL (ref 0.3–1.2)
Total Protein: 6.5 g/dL (ref 6.5–8.1)

## 2020-10-16 LAB — CBC
HCT: 36.5 % — ABNORMAL LOW (ref 39.0–52.0)
Hemoglobin: 11.9 g/dL — ABNORMAL LOW (ref 13.0–17.0)
MCH: 30.3 pg (ref 26.0–34.0)
MCHC: 32.6 g/dL (ref 30.0–36.0)
MCV: 92.9 fL (ref 80.0–100.0)
Platelets: 206 10*3/uL (ref 150–400)
RBC: 3.93 MIL/uL — ABNORMAL LOW (ref 4.22–5.81)
RDW: 14.4 % (ref 11.5–15.5)
WBC: 6.2 10*3/uL (ref 4.0–10.5)
nRBC: 0 % (ref 0.0–0.2)

## 2020-10-16 LAB — LACTATE DEHYDROGENASE: LDH: 183 U/L (ref 98–192)

## 2020-10-16 LAB — PROTIME-INR
INR: 2.2 — ABNORMAL HIGH (ref 0.8–1.2)
Prothrombin Time: 23.9 seconds — ABNORMAL HIGH (ref 11.4–15.2)

## 2020-10-16 LAB — MAGNESIUM: Magnesium: 1.8 mg/dL (ref 1.7–2.4)

## 2020-10-16 LAB — PREALBUMIN: Prealbumin: 17.1 mg/dL — ABNORMAL LOW (ref 18–38)

## 2020-10-16 MED ORDER — HYDRALAZINE HCL 50 MG PO TABS: 25.0000 mg | ORAL_TABLET | Freq: Three times a day (TID) | ORAL | 3 refills | Status: DC

## 2020-10-16 NOTE — Patient Instructions (Addendum)
1. STOP Spironolactone 2. Decrease Hydralazine to 25 mg three times a day (half a pill) 3. Return to clinic in 1 week

## 2020-10-16 NOTE — Progress Notes (Signed)
LVAD INR 

## 2020-10-16 NOTE — Progress Notes (Signed)
Patient presents for 2 month f/u visit in [facility] today alone. Reports no problems with VAD equipment or concerns with drive line.   Pt states he had "food poisoning" last week. He states that he had extreme vomiting and diarrhea for a couple days. Pt states that he has been more dizzy than usual the last couple days. Pt had to sit back down when I got him from the waiting room because he got so dizzy.  Denies any ICD shocks. Pt confirms he is taking Sotalol 80 mg daily. EKG obtained; reviewed by Dr Haroldine Laws.   20g PIV started in the RFA pt is receiving 1Liter of fluid currently. After receiving the fluid the pt felt much better and the dizziness was better. pts PI increased to 7.5  Vital Signs:   Doppler Pressure: 72 Automatc BP: 99/52 (69) HR: 59 SPO2: 99% on RA   Weight: 167.4 lb w/ eqt Last weight: 169 lb  VAD Indication: Destination therapy- patient choice     VAD interrogation & Equipment Management: Speed: 9200 Flow:  3.7 Power: 5.0w    PI: 3.8   Alarms: none Events: 5-10 PI events daily: pt had 40+ yesterday  Fixed speed 9200  Low speed limit: 8600   Primary Controller:  Replace back up battery in 24 months Back up controller:  Expiring replaced [id number]. Replace back up battery in 31 months   Annual Equipment Maintenance on UBC/PM was performed on 10/2020.   I reviewed the LVAD parameters from today and compared the results to the patient's prior recorded data. LVAD interrogation was NEGATIVE for significant power changes, NEGATIVE for clinical alarms and STABLE for PI events/speed drops. No programming changes were made and pump is functioning within specified parameters. Pt is performing daily controller and system monitor self tests along with completing weekly and monthly maintenance for LVAD equipment.  Exit Site Care: Drive line is being maintained weekly by [name], his wife. Dressing dry and intact. Stabilization device present and accurately applied. Pt  denies fever or chills. Pt has CHG allergy. Pt has adequate dressing supplies at home.   Significant Events on VAD Support:  10/2013>SBO 12/2016> elevated LDH, admit for Bival   Device: Dual Medtronic Therapies:  V therapy on at 222 bpm; V monitor on 171         A therapy on 171 bpm (ramp and burst overdrive paing); A monitor on 300 bpm  Pacing: AAI<=>DDD 60 bpm  Last check: 03/28/20  BP & Labs:  Doppler 72 - Doppler is reflecting map  Hgb 11.9 - No S/S of bleeding. Specifically denies melena/BRBPR or nosebleeds.   LDH 183- established baseline of 200- 350. Denies tea-colored urine. No power elevations noted on interrogation.    Batteries Manufacture Date: Number of uses: Re-calibration  07/07/18 86 Performed by patient   Annual maintenance completed per [employer] on patient's home power module and Electrical engineer.    Backup system controller 11 volt battery charged during visit.   7 year Intermacs follow up completed including:  Quality of Life, KCCQ-12, and Neurocognitive trail making.   Pt unable to complete 6MW due to ongoing knee pain and dizziness-pt is receiving fluids in clinic today  [place]  KCCQ-12 10/16/2020 02/13/2020  1 a. Ability to shower/bathe Not at all limited Slightly limited  1 b. Ability to walk 1 block Slightly limited Not at all limited  1 c. Ability to hurry/jog Moderately limited Slightly limited  2. Edema feet/ankles/legs Never over the  past 2 weeks Less than once a week  3. Limited by fatigue Never over the past 2 weeks Never over the past 2 weeks  4. Limited by dyspnea At least once a day Never over the past 2 weeks  5. Sitting up / on 3+ pillows Never over the past 2 weeks Never over the past 2 weeks  6. Limited enjoyment of life Not limited at all Not limited at all  7. Rest of life w/ symptoms Completely satisfied Completely satisfied  8 a. Participation in hobbies Slightly limited Slightly limited  8 b.  Participation in chores Did not limit at all Did not limit at all  8 c. Visiting family/friends Did not limit at all Did not limit at all     20 g PIV removed from RFA. Site CDI, dressing applied.  Patient Instructions:  1. STOP Spironolactone 2. Decrease Hydralazine to 25 mg three times a day (half a pill) 3. Return to clinic in 1 week w/ anemia panel added to full labs  Tanda Rockers RN [place] Coordinator  Office: [phone]  24/7 Pager: [phone]

## 2020-10-20 NOTE — Progress Notes (Addendum)
VAD CLINIC NOTE   HPI:  Johnny Oneill is a 80 y.o. 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   Admitted 3/4-3/25/15 with SOB and syncopal episode. Found to have SBO and NG placed and started on TPN. On 11/06/13 developed progressive SOB and hypotension and co-ox 51% and Hgb 6.7. Started on milrinone and transfused. Echo showed nl RV function. Patient had PEA arrest and was intubated and started on pressors. CXR revealed a R hemothorax and a R CT placed. He required VATs and evacuation of hemothorax and was then teansferred to CIR.   LVAD Issues  Elevated LDH--bivalirudin. D/C LDH 188  Presented to ER on 06/27/19 with syncopal episode and head injury from fall. ECG showed VT @ 240. Underwent emergent DC-CV in ER. Head CT with no bleed. Was admitted and had ICD gen replacement (EOL). Had small hematoma at site. Amio started. Post-discharge found to have episode of AFL on ICD remote check but has since resolved.    On 04/30/20 he was seen in the VAD clinic and spiro was stopped and jardiance was started.  He reports increased urine output after starting.   Had ICD shock for VT on 06/09/20. Started on sotalol   Returns for routine 2 month  f/u. Reports that he had a GI bug last week from food poisoning with bad n/v and diarrhea for several days. Since that time very dizzy and weak. Often when he stands up he has to sit back down. No syncope or palpitations.No ICD shocks.  No falls. No edema .Denies orthopnea or PND. No fevers, chills or problems with driveline. No bleeding, melena or neuro symptoms. No VAD alarms. Taking all meds as prescribed.    VAD Indication: Destination therapy- patient choice    VAD interrogation & Equipment Management: Speed: 9200 Flow:  3.7 Power: 5.0w PI: 3.8  Alarms: none Events: 5-10 PI events daily: pt had 40+ yesterday  Fixed speed 9200  Low speed limit:  8600  Primary Controller:  Replace back up battery in 24 months Back up controller: Expiring replaced [id number]. Replace back up battery in 31 months  Annual Equipment Maintenance on UBC/PM was performed on 10/2020.  Exit Site Care: Drive line is being maintained weekly by [name], his wife. Dressing dry and intact. Stabilization device present and accurately applied. Pt denies fever or chills. Pt has CHG allergy. Pt has sufficient dressing kits at home.     Past Medical History:  Diagnosis Date  . Anxiety   . Automatic implantable cardioverter-defibrillator in situ   . CAD (coronary artery disease)    S/P stenting of LCX in 2004;  s/p Lat MI 2009 with occlusion of the LCX - treated with Promus stenting. Has total occlusion of the RCA.   [name] CHF (congestive heart failure) ([place])   . Hypercholesteremia   . Hypertension   . Hypothyroidism   . ICD (implantable cardiac defibrillator) in place    PROPHYLACTIC      medtronic  . Inferior MI ([place]) "? date"; 2009  . Ischemic cardiomyopathy    a. 10/09 Echo: Sev LV Dysfxn, inf/lat AK, Mod MR.  . Liver disease   . LVAD (left ventricular assist device) present ([place])   . Osteoarthritis    a. s/p R TKR  . Pacemaker   . PVC (premature ventricular contraction)   . RBBB   . Shortness of breath  Current Outpatient Medications  Medication Sig Dispense Refill  . acetaminophen (TYLENOL) 500 MG chewable tablet Chew 500 mg by mouth every 6 (six) hours as needed for pain.    [name] aspirin EC 81 MG tablet Take 81 mg by mouth daily.    . cholecalciferol (VITAMIN D3) 25 MCG (1000 UT) tablet Take 1,000 Units by mouth daily.    . citalopram (CELEXA) 10 MG tablet Take 1 tablet by mouth once daily 90 tablet 3  . Coenzyme Q10 (COQ10) 200 MG CAPS Take 200 mg by mouth daily.    Arna Medici 137 MCG tablet TAKE ONE-HALF TABLET BY MOUTH ON SUNDAY, THEN TAKE 1 TABLET BY MOUTH ON ALL OTHER DAYS 105 tablet 1  . famotidine (PEPCID) 20 MG tablet Take 20 mg by mouth  daily.     . Melatonin 3 MG TABS Take 1 tablet by mouth at bedtime.    . pantoprazole (PROTONIX) 40 MG tablet Take 1 tablet (40 mg total) by mouth daily. 90 tablet 3  . Probiotic Product (PROBIOTIC PO) Take 1 capsule by mouth daily.    . rosuvastatin (CRESTOR) 20 MG tablet Take 1 tablet (20 mg total) by mouth daily. 90 tablet 3  . sotalol (BETAPACE) 80 MG tablet Take 1 tablet (80 mg total) by mouth daily. 90 tablet 3  . traZODone (DESYREL) 50 MG tablet Take 1 tablet (50 mg total) by mouth at bedtime. 30 tablet 5  . warfarin (COUMADIN) 5 MG tablet Take 5 mg every Monday and 7.5 mg all other days or as directed by HF Clinic 135 tablet 11  . furosemide (LASIX) 20 MG tablet Take 20 mg by mouth daily as needed (Take 20 mg as daily as needed for wt > 153 lbs). Reported on 01/14/2016 (Patient not taking: No sig reported)    . hydrALAZINE (APRESOLINE) 50 MG tablet Take 0.5 tablets (25 mg total) by mouth 3 (three) times daily. 270 tablet 3  . potassium chloride SA (K-DUR,KLOR-CON) 20 MEQ tablet Take 1 tablet (20 mEq total) by mouth daily as needed. Take one potassium pill when you take your lasix. (Patient not taking: No sig reported) 90 tablet 3   No current facility-administered medications for this encounter.   Facility-Administered Medications Ordered in Other Encounters  Medication Dose Route Frequency Provider Last Rate Last Admin  . etomidate (AMIDATE) injection    Anesthesia Intra-op Myna Bright, CRNA   20 mg at 02/08/14 0915  . rocuronium [facility]) injection    Anesthesia Intra-op Myna Bright, CRNA   50 mg at 02/08/14 0932  . succinylcholine (ANECTINE) injection    Anesthesia Intra-op Myna Bright, CRNA   100 mg at 02/08/14 0915   Ace inhibitors, Diovan [valsartan], Lipitor [atorvastatin calcium], and Norvasc [amlodipine]  Review of systems complete and found to be negative unless listed in HPI.     Vital Signs:  Doppler Pressure: 84 Vitals:   10/16/20 1205 10/16/20  1206  BP: (!) 99/52 (!) 72/0  Pulse: (!) 59   SpO2: 99%   SPO2: 98% on room air.  RA  Vital Signs:   Doppler Pressure: 72 Automatc BP: 99/52 (69) HR: 59 SPO2: 99% on RA  Weight: 167.4 lb w/ eqt Last weight: 169 lb  Physical Exam: General:  Weak. Pale appearing.  HEENT: normal  Neck: supple. JVP not elevated.  Carotids 2+ bilat; no bruits. No lymphadenopathy or thryomegaly appreciated. Cor: LVAD hum.  Lungs: Clear. Abdomen: obese soft, nontender, non-distended. No hepatosplenomegaly. No bruits or  masses. Good bowel sounds. Driveline site clean. Anchor in place.  Extremities: no cyanosis, clubbing, rash. Warm no edema  Neuro: alert & oriented x 3. No focal deficits. Moves all 4 without problem     ASSESSMENT AND PLAN:   1. Dizziness/weakness - suspect volume depleted from recent GI illness. - we placed IV and gave 1L NS - liberalize fluid and salt intake  2) Chronic systolic HF: ICM, EF 0000000 s/p LVAD HM II implant 08/2013 for DT.   - Echo 3/18 EF 10-15% mild AI. Mod RV dysfunction. LVAD cannula OK - Overall NYHA I but worse today due to volume depletion. Plan as above - Encouraged him to liberalize salt intak. - Stop spiro, Cut hydralazine to 25 tid  - Failed Jardiance due to volume depletion - He has not tolerated ACE-I or amlodipine or b-blocker in the past.  3) LVAD: HMII 08/2013 for DT. - VAD interrogated personally. Multiple PI events due to low volume. Plan as above - Admitted in May 2018 with elevated LDH with bival.  - Continue aspirin and coumadin.  - Hgb 11.9. LDH 183 - DL site ok - MAPs low. Given fluids  4) VT - now s/p ICD gen change.  - Off amio due to cerebellar side effects.  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 Now on sotalol. ECG today ok - No recent VT - Follows with Dr. Curt Bears - Keep K> 4.0 Mg > 2.0  5) Chronic anticoagulation: INR goal 2.0-3.0.    - INR 2.2 Discussed dosing with PharmD personally. - Continue ASA 81 mg for LVAD + warfarin.   6) HTN:   - BP low. Give IVF - Stop spiro, Cut hydralazine to 25 tid   7) PAFL - Remains in NSR on ECG. On warfarin   8) Hypothyroidism:  - Followed by Dr Dwyane Dee. Stable.No change   9) Hyperlipidemia:  - Continue Crestor. Zetia stopped due to cost.  - No change.   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 IVF given today  12) Elevated liver enzymes - normalized off amio. Liver u/s ok - labs ok today  Total time spent 45 minutes. Over half that time spent discussing above.     Glori Bickers, MD  8:39 PM

## 2020-10-22 ENCOUNTER — Other Ambulatory Visit (HOSPITAL_COMMUNITY): Payer: Self-pay | Admitting: *Deleted

## 2020-10-22 ENCOUNTER — Encounter (HOSPITAL_COMMUNITY): Payer: Self-pay | Admitting: *Deleted

## 2020-10-22 DIAGNOSIS — Z7901 Long term (current) use of anticoagulants: Secondary | ICD-10-CM

## 2020-10-22 DIAGNOSIS — Z95811 Presence of heart assist device: Secondary | ICD-10-CM

## 2020-10-22 DIAGNOSIS — D508 Other iron deficiency anemias: Secondary | ICD-10-CM

## 2020-10-22 DIAGNOSIS — I5022 Chronic systolic (congestive) heart failure: Secondary | ICD-10-CM

## 2020-10-22 DIAGNOSIS — I472 Ventricular tachycardia, unspecified: Secondary | ICD-10-CM

## 2020-10-24 ENCOUNTER — Encounter (HOSPITAL_COMMUNITY): Payer: Medicare Other

## 2020-10-25 ENCOUNTER — Ambulatory Visit (HOSPITAL_COMMUNITY)
Admission: RE | Admit: 2020-10-25 | Discharge: 2020-10-25 | Disposition: A | Discharge status: Home / Self Care | Payer: Medicare Other | Source: Ambulatory Visit | Attending: Internal Medicine | Admitting: Internal Medicine

## 2020-10-25 ENCOUNTER — Ambulatory Visit (HOSPITAL_COMMUNITY): Payer: Self-pay | Admitting: Pharmacist

## 2020-10-25 ENCOUNTER — Other Ambulatory Visit: Payer: Self-pay

## 2020-10-25 ENCOUNTER — Encounter (HOSPITAL_COMMUNITY): Payer: Self-pay

## 2020-10-25 DIAGNOSIS — Z95810 Presence of automatic (implantable) cardiac defibrillator: Secondary | ICD-10-CM | POA: Insufficient documentation

## 2020-10-25 DIAGNOSIS — Z955 Presence of coronary angioplasty implant and graft: Secondary | ICD-10-CM | POA: Diagnosis not present

## 2020-10-25 DIAGNOSIS — I5022 Chronic systolic (congestive) heart failure: Secondary | ICD-10-CM

## 2020-10-25 DIAGNOSIS — I2510 Atherosclerotic heart disease of native coronary artery without angina pectoris: Secondary | ICD-10-CM | POA: Insufficient documentation

## 2020-10-25 DIAGNOSIS — D508 Other iron deficiency anemias: Secondary | ICD-10-CM

## 2020-10-25 DIAGNOSIS — Z95811 Presence of heart assist device: Secondary | ICD-10-CM | POA: Diagnosis not present

## 2020-10-25 DIAGNOSIS — E785 Hyperlipidemia, unspecified: Secondary | ICD-10-CM | POA: Diagnosis not present

## 2020-10-25 DIAGNOSIS — Z7982 Long term (current) use of aspirin: Secondary | ICD-10-CM | POA: Diagnosis not present

## 2020-10-25 DIAGNOSIS — E039 Hypothyroidism, unspecified: Secondary | ICD-10-CM | POA: Diagnosis not present

## 2020-10-25 DIAGNOSIS — I255 Ischemic cardiomyopathy: Secondary | ICD-10-CM | POA: Diagnosis not present

## 2020-10-25 DIAGNOSIS — N1832 Chronic kidney disease, stage 3b: Secondary | ICD-10-CM | POA: Diagnosis not present

## 2020-10-25 DIAGNOSIS — R748 Abnormal levels of other serum enzymes: Secondary | ICD-10-CM | POA: Diagnosis not present

## 2020-10-25 DIAGNOSIS — Z79899 Other long term (current) drug therapy: Secondary | ICD-10-CM | POA: Diagnosis not present

## 2020-10-25 DIAGNOSIS — I252 Old myocardial infarction: Secondary | ICD-10-CM | POA: Insufficient documentation

## 2020-10-25 DIAGNOSIS — I472 Ventricular tachycardia, unspecified: Secondary | ICD-10-CM

## 2020-10-25 DIAGNOSIS — E7800 Pure hypercholesterolemia, unspecified: Secondary | ICD-10-CM | POA: Insufficient documentation

## 2020-10-25 DIAGNOSIS — Z7901 Long term (current) use of anticoagulants: Secondary | ICD-10-CM

## 2020-10-25 DIAGNOSIS — I130 Hypertensive heart and chronic kidney disease with heart failure and stage 1 through stage 4 chronic kidney disease, or unspecified chronic kidney disease: Secondary | ICD-10-CM | POA: Insufficient documentation

## 2020-10-25 LAB — BASIC METABOLIC PANEL
Anion gap: 8 (ref 5–15)
BUN: 17 mg/dL (ref 8–23)
CO2: 26 mmol/L (ref 22–32)
Calcium: 9 mg/dL (ref 8.9–10.3)
Chloride: 104 mmol/L (ref 98–111)
Creatinine, Ser: 1.71 mg/dL — ABNORMAL HIGH (ref 0.61–1.24)
GFR, Estimated: 41 mL/min — ABNORMAL LOW (ref >60)
Glucose, Bld: 119 mg/dL — ABNORMAL HIGH (ref 70–99)
Potassium: 4 mmol/L (ref 3.5–5.1)
Sodium: 138 mmol/L (ref 135–145)

## 2020-10-25 LAB — IRON AND TIBC
Iron: 72 ug/dL (ref 45–182)
Saturation Ratios: 23 % (ref 17.9–39.5)
TIBC: 315 ug/dL (ref 250–450)
UIBC: 243 ug/dL

## 2020-10-25 LAB — FERRITIN: Ferritin: 98 ng/mL (ref 24–336)

## 2020-10-25 LAB — CBC
HCT: 39.7 % (ref 39.0–52.0)
Hemoglobin: 13.1 g/dL (ref 13.0–17.0)
MCH: 30.8 pg (ref 26.0–34.0)
MCHC: 33 g/dL (ref 30.0–36.0)
MCV: 93.2 fL (ref 80.0–100.0)
Platelets: 217 10*3/uL (ref 150–400)
RBC: 4.26 MIL/uL (ref 4.22–5.81)
RDW: 14.8 % (ref 11.5–15.5)
WBC: 5 10*3/uL (ref 4.0–10.5)
nRBC: 0 % (ref 0.0–0.2)

## 2020-10-25 LAB — PROTIME-INR
INR: 2.2 — ABNORMAL HIGH (ref 0.8–1.2)
Prothrombin Time: 23.7 seconds — ABNORMAL HIGH (ref 11.4–15.2)

## 2020-10-25 LAB — VITAMIN B12: Vitamin B-12: 231 pg/mL (ref 180–914)

## 2020-10-25 LAB — FOLATE: Folate: 11.4 ng/mL (ref >5.9)

## 2020-10-25 NOTE — Patient Instructions (Signed)
1. No medication changes today 2. Coumadin dosing per Lauren PharmD 3. Return to VAD clinic in 2 months   

## 2020-10-25 NOTE — Progress Notes (Signed)
LVAD INR 

## 2020-10-25 NOTE — Progress Notes (Signed)
Patient presents for 1 week f/u visit in [facility] today alone. Reports no problems with VAD equipment or concerns with drive line.   Pt states he is feeling much better than last week. Denies lightheadedness/dizziness (other than when he stands up too fast), denies shortness of breath, pedal edema, palpitations, or ICD shocks. Reports increased PO intake since last week. 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 Encouraged him to make sure he keeps up his hydration while working. He verbalized understanding.   Reports he stopped Spironolactone, and decreased Hydralazine to 25 mg TID. BP improved today. Lightheaded and dizzy spells resolved. See documentation below.   Vital Signs:   Doppler Pressure: 110 Automatc BP: 113/75 (91) HR: 60 SPO2: 100% on RA   Weight: 171.4 lb w/ eqt Last weight: 167.4 lb  VAD Indication: Destination therapy- patient choice     VAD interrogation & Equipment Management: Speed: 9200 Flow: 4.3 Power: 5.3w    PI: 6.9   Alarms: none Events: 5-15 PI events daily  Fixed speed 9200  Low speed limit: 8600   Primary Controller:  Replace back up battery in 24 months Back up controller:  Replace back up battery in 31 months -- did not bring today   Annual Equipment Maintenance on UBC/PM was performed on 10/2020.   I reviewed the LVAD parameters from today and compared the results to the patient's prior recorded data. LVAD interrogation was NEGATIVE for significant power changes, NEGATIVE for clinical alarms and STABLE for PI events/speed drops. No programming changes were made and pump is functioning within specified parameters. Pt is performing daily controller and system monitor self tests along with completing weekly and monthly maintenance for LVAD equipment.  Exit Site Care: Drive line is being maintained weekly by [name], his wife. Dressing dry and intact. Stabilization device present and accurately applied. Pt denies  fever or chills. Pt has CHG allergy. Pt has adequate dressing supplies at home.   Significant Events on VAD Support:  10/2013>SBO 12/2016> elevated LDH, admit for Bival   Device: Dual Medtronic Therapies:  V therapy on at 222 bpm; V monitor on 171         A therapy on 171 bpm (ramp and burst overdrive paing); A monitor on 300 bpm  Pacing: AAI<=>DDD 60 bpm  Last check: 03/28/20  BP & Labs:  Doppler 110 - Doppler is reflecting map  Hgb 13.1 - No S/S of bleeding. Specifically denies melena/BRBPR or nosebleeds.   LDH not drawn today- established baseline of 200- 350. Denies tea-colored urine. No power elevations noted on interrogation.    Patient Instructions:  1. No medication changes today 2. Coumadin dosing per Ander Purpura PharmD 3. Return to [facility] [place] clinic in 2 months   Emerson Monte RN [place] Coordinator  Office: [phone]  24/7 Pager: [phone]

## 2020-10-25 NOTE — Progress Notes (Signed)
VAD CLINIC NOTE   HPI:  Johnny Oneill is a 80 y.o. 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   Admitted 3/4-3/25/15 with SOB and syncopal episode. Found to have SBO and NG placed and started on TPN. On 11/06/13 developed progressive SOB and hypotension and co-ox 51% and Hgb 6.7. Started on milrinone and transfused. Echo showed nl RV function. Patient had PEA arrest and was intubated and started on pressors. CXR revealed a R hemothorax and a R CT placed. He required VATs and evacuation of hemothorax and was then teansferred to CIR.   LVAD Issues  Elevated LDH--bivalirudin. D/C LDH 188  Presented to ER on 06/27/19 with syncopal episode and head injury from fall. ECG showed VT @ 240. Underwent emergent DC-CV in ER. Head CT with no bleed. Was admitted and had ICD gen replacement (EOL). Had small hematoma at site. Amio started. Post-discharge found to have episode of AFL on ICD remote check but has since resolved.    On 04/30/20 he was seen in the VAD clinic and spiro was stopped and jardiance was started.  He reports increased urine output after starting.   Had ICD shock for VT on 06/09/20. Started on sotalol   Returns for f/u. At last visit was orthostatic and weak after having GI bug. Got IVF. Hydralazine cut to 25 tid and spiro stopped. Feels much better today. Denies significant orthostasis. Trying to drink more. Denies orthopnea or PND. No fevers, chills or problems with driveline. No bleeding, melena or neuro symptoms. No VAD alarms. Taking all meds as prescribed.    VAD Indication: Destination therapy- patient choice    VAD interrogation & Equipment Management: Speed: 9200 Flow: 4.3 Power: 5.3w PI: 6.9  Alarms: none Events: 5-15 PI events daily  Fixed speed 9200  Low speed limit: 8600  Primary Controller:  Replace back up battery in 24 months Back up controller:  Replace back up  battery in 31 months -- did not bring today   Annual Equipment Maintenance on UBC/PM was performed on 10/2020.  Exit Site Care: Drive line is being maintained weekly by [name], his wife. Dressing dry and intact. Stabilization device present and accurately applied. Pt denies fever or chills. Pt has CHG allergy. Pt has sufficient dressing kits at home.     Past Medical History:  Diagnosis Date  . Anxiety   . Automatic implantable cardioverter-defibrillator in situ   . CAD (coronary artery disease)    S/P stenting of LCX in 2004;  s/p Lat MI 2009 with occlusion of the LCX - treated with Promus stenting. Has total occlusion of the RCA.   [name] CHF (congestive heart failure) ([place])   . Hypercholesteremia   . Hypertension   . Hypothyroidism   . ICD (implantable cardiac defibrillator) in place    PROPHYLACTIC      medtronic  . Inferior MI ([place]) "? date"; 2009  . Ischemic cardiomyopathy    a. 10/09 Echo: Sev LV Dysfxn, inf/lat AK, Mod MR.  . Liver disease   . LVAD (left ventricular assist device) present ([place])   . Osteoarthritis    a. s/p R TKR  . Pacemaker   . PVC (premature ventricular contraction)   . RBBB   . Shortness of breath     Current Outpatient Medications  Medication Sig Dispense Refill  . acetaminophen (TYLENOL) 500 MG chewable tablet Chew 500 mg by mouth every  6 (six) hours as needed for pain.    [name] aspirin EC 81 MG tablet Take 81 mg by mouth daily.    . cholecalciferol (VITAMIN D3) 25 MCG (1000 UT) tablet Take 1,000 Units by mouth daily.    . citalopram (CELEXA) 10 MG tablet Take 1 tablet by mouth once daily 90 tablet 3  . Coenzyme Q10 (COQ10) 200 MG CAPS Take 200 mg by mouth daily.    Arna Medici 137 MCG tablet TAKE ONE-HALF TABLET BY MOUTH ON SUNDAY, THEN TAKE 1 TABLET BY MOUTH ON ALL OTHER DAYS 105 tablet 1  . famotidine (PEPCID) 20 MG tablet Take 20 mg by mouth daily.     . hydrALAZINE (APRESOLINE) 50 MG tablet Take 0.5 tablets (25 mg total) by mouth 3 (three) times  daily. 270 tablet 3  . Melatonin 3 MG TABS Take 1 tablet by mouth at bedtime.    . pantoprazole (PROTONIX) 40 MG tablet Take 1 tablet (40 mg total) by mouth daily. 90 tablet 3  . Probiotic Product (PROBIOTIC PO) Take 1 capsule by mouth daily.    . rosuvastatin (CRESTOR) 20 MG tablet Take 1 tablet (20 mg total) by mouth daily. 90 tablet 3  . sotalol (BETAPACE) 80 MG tablet Take 1 tablet (80 mg total) by mouth daily. 90 tablet 3  . traZODone (DESYREL) 50 MG tablet Take 1 tablet (50 mg total) by mouth at bedtime. 30 tablet 5  . warfarin (COUMADIN) 5 MG tablet Take 5 mg every Monday and 7.5 mg all other days or as directed by HF Clinic 135 tablet 11  . furosemide (LASIX) 20 MG tablet Take 20 mg by mouth daily as needed (Take 20 mg as daily as needed for wt > 153 lbs). Reported on 01/14/2016 (Patient not taking: No sig reported)    . potassium chloride SA (K-DUR,KLOR-CON) 20 MEQ tablet Take 1 tablet (20 mEq total) by mouth daily as needed. Take one potassium pill when you take your lasix. (Patient not taking: No sig reported) 90 tablet 3   No current facility-administered medications for this encounter.   Facility-Administered Medications Ordered in Other Encounters  Medication Dose Route Frequency Provider Last Rate Last Admin  . etomidate (AMIDATE) injection    Anesthesia Intra-op Myna Bright, CRNA   20 mg at 02/08/14 0915  . rocuronium [facility]) injection    Anesthesia Intra-op Myna Bright, CRNA   50 mg at 02/08/14 0932  . succinylcholine (ANECTINE) injection    Anesthesia Intra-op Myna Bright, CRNA   100 mg at 02/08/14 0915   Ace inhibitors, Amiodarone, Diovan [valsartan], Lipitor [atorvastatin calcium], and Norvasc [amlodipine]  Review of systems complete and found to be negative unless listed in HPI.     Vital Signs:  Doppler Pressure: 84 Vitals:   10/25/20 0923 10/25/20 0924  BP: (!) 110/0 113/75  Pulse: 60   SpO2: 100%   SPO2: 98% on room air.  RA    Vital  Signs:   Doppler Pressure: 110 Automatc BP: 113/75 (91) HR: 60 SPO2: 100% on RA  Weight: 171.4 lb w/ eqt Last weight: 167.4 lb   Physical Exam: General:  Well appearing. No resp difficulty HEENT: normal Neck: supple. no JVD. Carotids 2+ bilat; no bruits. No lymphadenopathy or thryomegaly appreciated. Cor: PMI nondisplaced. Regular rate & rhythm. No rubs, gallops or murmurs. Lungs: clear Abdomen: soft, nontender, nondistended. No hepatosplenomegaly. No bruits or masses. Good bowel sounds. Extremities: no cyanosis, clubbing, rash, edema Neuro: alert & orientedx3, cranial  nerves grossly intact. moves all 4 extremities w/o difficulty. Affect pleasant     ASSESSMENT AND PLAN:   1. Dizziness/weakness - resolved. MAPs much improved  2) Chronic systolic HF: ICM, EF 0000000 s/p LVAD HM II implant 08/2013 for DT.   - Echo 3/18 EF 10-15% mild AI. Mod RV dysfunction. LVAD cannula OK - Doing well NYHA I - Encouraged him to liberalize salt intake. - Now off all diuretics including spiro - Failed Jardiance due to volume depletion - He has not tolerated ACE-I or amlodipine or b-blocker in the past.  3) LVAD: HMII 08/2013 for DT. - VAD interrogated personally. Parameters stable. - Admitted in May 2018 with elevated LDH with bival.  - Continue aspirin and coumadin.  - Hgb 13.1 LDH pending - DL site ok - MAP improved  4) VT - now s/p ICD gen change.  - Off amio due to cerebellar side effects.  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 Now on sotalol.  - No recent VT. Continue sotalol - Follows with Dr. Curt Bears - Keep K> 4.0 Mg > 2.0  5) Chronic anticoagulation: INR goal 2.0-3.0.    - INR 2.2 Discussed dosing with PharmD personally. - Continue ASA 81 mg for LVAD + warfarin.   6) HTN:  - MAPs improved. 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 On warfarin   8) Hypothyroidism:  - Followed by Dr Dwyane Dee. Stable.No change   9) Hyperlipidemia:  - Continue Crestor. Zetia stopped due to  cost.  - No change.   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 Liver u/s ok - labs ok today  Total time spent 35 minutes. Over half that time spent discussing above.    Glori Bickers, MD  10:27 AM

## 2020-11-04 ENCOUNTER — Other Ambulatory Visit: Payer: Self-pay | Admitting: Physician Assistant

## 2020-11-04 DIAGNOSIS — R7989 Other specified abnormal findings of blood chemistry: Secondary | ICD-10-CM

## 2020-11-08 ENCOUNTER — Other Ambulatory Visit (HOSPITAL_COMMUNITY): Payer: Self-pay | Admitting: *Deleted

## 2020-11-08 DIAGNOSIS — Z7901 Long term (current) use of anticoagulants: Secondary | ICD-10-CM

## 2020-11-08 DIAGNOSIS — Z95811 Presence of heart assist device: Secondary | ICD-10-CM

## 2020-11-12 ENCOUNTER — Ambulatory Visit (HOSPITAL_COMMUNITY)
Admission: RE | Admit: 2020-11-12 | Discharge: 2020-11-12 | Disposition: A | Discharge status: Home / Self Care | Payer: Medicare Other | Source: Ambulatory Visit | Attending: Internal Medicine | Admitting: Internal Medicine

## 2020-11-12 ENCOUNTER — Other Ambulatory Visit: Payer: Self-pay

## 2020-11-12 ENCOUNTER — Ambulatory Visit (HOSPITAL_COMMUNITY): Payer: Self-pay | Admitting: Pharmacist

## 2020-11-12 DIAGNOSIS — Z95811 Presence of heart assist device: Secondary | ICD-10-CM | POA: Insufficient documentation

## 2020-11-12 DIAGNOSIS — Z7901 Long term (current) use of anticoagulants: Secondary | ICD-10-CM

## 2020-11-12 DIAGNOSIS — I472 Ventricular tachycardia, unspecified: Secondary | ICD-10-CM

## 2020-11-12 LAB — PROTIME-INR
INR: 2.2 — ABNORMAL HIGH (ref 0.8–1.2)
Prothrombin Time: 24 seconds — ABNORMAL HIGH (ref 11.4–15.2)

## 2020-11-12 LAB — LACTATE DEHYDROGENASE: LDH: 237 U/L — ABNORMAL HIGH (ref 98–192)

## 2020-11-12 MED ORDER — WARFARIN SODIUM 5 MG PO TABS
ORAL_TABLET | ORAL | 11 refills | Status: DC
Start: 2020-11-12 — End: 2021-02-17

## 2020-11-12 NOTE — Progress Notes (Signed)
LVAD INR 

## 2020-11-19 ENCOUNTER — Encounter (HOSPITAL_COMMUNITY): Payer: Self-pay | Admitting: Licensed Clinical Social Worker

## 2020-11-26 ENCOUNTER — Other Ambulatory Visit (HOSPITAL_COMMUNITY): Payer: Self-pay | Admitting: Unknown Physician Specialty

## 2020-11-26 DIAGNOSIS — I5043 Acute on chronic combined systolic (congestive) and diastolic (congestive) heart failure: Secondary | ICD-10-CM

## 2020-11-26 DIAGNOSIS — E785 Hyperlipidemia, unspecified: Secondary | ICD-10-CM

## 2020-11-26 MED ORDER — ROSUVASTATIN CALCIUM 20 MG PO TABS: 20.0000 mg | ORAL_TABLET | Freq: Every day | ORAL | 3 refills | Status: DC

## 2020-11-29 ENCOUNTER — Other Ambulatory Visit (HOSPITAL_COMMUNITY): Payer: Self-pay | Admitting: Unknown Physician Specialty

## 2020-11-29 DIAGNOSIS — Z95811 Presence of heart assist device: Secondary | ICD-10-CM

## 2020-11-29 DIAGNOSIS — Z7901 Long term (current) use of anticoagulants: Secondary | ICD-10-CM

## 2020-12-01 ENCOUNTER — Encounter (HOSPITAL_COMMUNITY): Payer: Self-pay

## 2020-12-01 ENCOUNTER — Emergency Department (HOSPITAL_COMMUNITY)
Admission: EM | Admit: 2020-12-01 | Discharge: 2020-12-01 | Disposition: A | Discharge status: Home / Self Care | Payer: Medicare Other | Attending: Emergency Medicine | Admitting: Emergency Medicine

## 2020-12-01 ENCOUNTER — Emergency Department (HOSPITAL_COMMUNITY): Payer: Medicare Other

## 2020-12-01 DIAGNOSIS — Z79899 Other long term (current) drug therapy: Secondary | ICD-10-CM | POA: Insufficient documentation

## 2020-12-01 DIAGNOSIS — I517 Cardiomegaly: Secondary | ICD-10-CM | POA: Diagnosis not present

## 2020-12-01 DIAGNOSIS — I5022 Chronic systolic (congestive) heart failure: Secondary | ICD-10-CM | POA: Diagnosis not present

## 2020-12-01 DIAGNOSIS — Z7982 Long term (current) use of aspirin: Secondary | ICD-10-CM | POA: Diagnosis not present

## 2020-12-01 DIAGNOSIS — I110 Hypertensive heart disease with heart failure: Secondary | ICD-10-CM | POA: Diagnosis not present

## 2020-12-01 DIAGNOSIS — Z96652 Presence of left artificial knee joint: Secondary | ICD-10-CM | POA: Insufficient documentation

## 2020-12-01 DIAGNOSIS — E039 Hypothyroidism, unspecified: Secondary | ICD-10-CM | POA: Insufficient documentation

## 2020-12-01 DIAGNOSIS — R21 Rash and other nonspecific skin eruption: Secondary | ICD-10-CM | POA: Diagnosis not present

## 2020-12-01 DIAGNOSIS — Z95811 Presence of heart assist device: Secondary | ICD-10-CM | POA: Diagnosis not present

## 2020-12-01 DIAGNOSIS — R42 Dizziness and giddiness: Secondary | ICD-10-CM

## 2020-12-01 DIAGNOSIS — I50810 Right heart failure, unspecified: Secondary | ICD-10-CM | POA: Diagnosis not present

## 2020-12-01 DIAGNOSIS — I2510 Atherosclerotic heart disease of native coronary artery without angina pectoris: Secondary | ICD-10-CM | POA: Insufficient documentation

## 2020-12-01 DIAGNOSIS — Z7901 Long term (current) use of anticoagulants: Secondary | ICD-10-CM | POA: Diagnosis not present

## 2020-12-01 DIAGNOSIS — E860 Dehydration: Secondary | ICD-10-CM | POA: Diagnosis not present

## 2020-12-01 LAB — COMPREHENSIVE METABOLIC PANEL
ALT: 13 U/L (ref 0–44)
AST: 22 U/L (ref 15–41)
Albumin: 3.7 g/dL (ref 3.5–5.0)
Alkaline Phosphatase: 73 U/L (ref 38–126)
Anion gap: 6 (ref 5–15)
BUN: 25 mg/dL — ABNORMAL HIGH (ref 8–23)
CO2: 25 mmol/L (ref 22–32)
Calcium: 8.6 mg/dL — ABNORMAL LOW (ref 8.9–10.3)
Chloride: 106 mmol/L (ref 98–111)
Creatinine, Ser: 2.01 mg/dL — ABNORMAL HIGH (ref 0.61–1.24)
GFR, Estimated: 34 mL/min — ABNORMAL LOW (ref >60)
Glucose, Bld: 107 mg/dL — ABNORMAL HIGH (ref 70–99)
Potassium: 4.1 mmol/L (ref 3.5–5.1)
Sodium: 137 mmol/L (ref 135–145)
Total Bilirubin: 0.8 mg/dL (ref 0.3–1.2)
Total Protein: 6.5 g/dL (ref 6.5–8.1)

## 2020-12-01 LAB — CBC WITH DIFFERENTIAL/PLATELET
Abs Immature Granulocytes: 0.03 10*3/uL (ref 0.00–0.07)
Basophils Absolute: 0 10*3/uL (ref 0.0–0.1)
Basophils Relative: 0 %
Eosinophils Absolute: 0.2 10*3/uL (ref 0.0–0.5)
Eosinophils Relative: 4 %
HCT: 36.8 % — ABNORMAL LOW (ref 39.0–52.0)
Hemoglobin: 12 g/dL — ABNORMAL LOW (ref 13.0–17.0)
Immature Granulocytes: 1 %
Lymphocytes Relative: 16 %
Lymphs Abs: 1 10*3/uL (ref 0.7–4.0)
MCH: 30.8 pg (ref 26.0–34.0)
MCHC: 32.6 g/dL (ref 30.0–36.0)
MCV: 94.4 fL (ref 80.0–100.0)
Monocytes Absolute: 0.8 10*3/uL (ref 0.1–1.0)
Monocytes Relative: 13 %
Neutro Abs: 4.2 10*3/uL (ref 1.7–7.7)
Neutrophils Relative %: 66 %
Platelets: 146 10*3/uL — ABNORMAL LOW (ref 150–400)
RBC: 3.9 MIL/uL — ABNORMAL LOW (ref 4.22–5.81)
RDW: 15.5 % (ref 11.5–15.5)
WBC: 6.3 10*3/uL (ref 4.0–10.5)
nRBC: 0 % (ref 0.0–0.2)

## 2020-12-01 LAB — PROTIME-INR
INR: 2.7 — ABNORMAL HIGH (ref 0.8–1.2)
Prothrombin Time: 27.5 seconds — ABNORMAL HIGH (ref 11.4–15.2)

## 2020-12-01 LAB — LACTATE DEHYDROGENASE: LDH: 227 U/L — ABNORMAL HIGH (ref 98–192)

## 2020-12-01 LAB — BRAIN NATRIURETIC PEPTIDE: B Natriuretic Peptide: 409.8 pg/mL — ABNORMAL HIGH (ref 0.0–100.0)

## 2020-12-01 NOTE — ED Notes (Signed)
PIV D/C IN LT [facility] AT THIS TIME. CATH INTACT DRESSING APPLIED

## 2020-12-01 NOTE — Discharge Instructions (Addendum)
As discussed, it is very important that you follow-up with your cardiologist in the coming days.  Return here for concerning changes in your condition.  In the interim, please focus on appropriate oral rehydration.

## 2020-12-01 NOTE — ED Triage Notes (Signed)
Dizziness that started this AM. Pt is concerned that his LVAD is malfunctioning. Alert and oriented x 4.

## 2020-12-01 NOTE — Consult Note (Signed)
VAD Team Consult Note   HPI:  Johnny Oneill is a 80 y.o. 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   We are seeing him today in the ER as a consult from Dr. Vanita Panda due to dizziness.  Presented to ER on 06/27/19 with syncopal episode and head injury from fall. ECG showed VT 240. Underwent emergent DC-CV in ER. Head CT with no bleed. Was admitted and had ICD gen replacement (EOL). Had small hematoma at site. Amio started. Post-discharge found to have episode of AFL on ICD remote check but has since resolved.    On 04/30/20 he was seen in the VAD clinic and spiro was stopped and jardiance was started.  He reports increased urine output after starting. Amio stopped due to severe side effects (tremor)    On 05/09/20 seen in [facility] due to ICD shock. ICD settings changed  Had ICD shock for VT on 06/09/20. Started on sotalol   Recently in Clinic was orthostatic and weak after having GI bug. Got IVF.   Yesterday [place] at the gun show at felt fine. This am says every time he stood up he felt lightheaded and had to sit back down. Drank some fluids with mild improvement but didn't resolve so wife brought to ER.  Has occasional mild LE edema at nights. Denies orthopnea or PND. No n/v/d. No fevers, chills or problems with driveline. No bleeding, melena or neuro symptoms. No VAD alarms. Taking all meds as prescribed. Trying to drink fluids.     VAD Indication: Destination therapy- patient choice    VAD interrogation & Equipment Management: Speed: 9200 Flow:  4.0 Power: 5.0 w PI: 7.3 Alarms: none Events: 5-10  PI events daily no change Fixed speed 9200  Low speed limit: 8600   Annual Equipment Maintenance on UBC/PM was performed on 05/2019  Exit Site Care: Drive line is being maintained weekly by [name], his wife. Dressing dry and intact. Stabilization device present and accurately applied.  Pt denies fever or chills. Pt has CHG allergy. Pt has sufficient dressing kits at home.     Past Medical History:  Diagnosis Date  . Anxiety   . Automatic implantable cardioverter-defibrillator in situ   . CAD (coronary artery disease)    S/P stenting of LCX in 2004;  s/p Lat MI 2009 with occlusion of the LCX - treated with Promus stenting. Has total occlusion of the RCA.   [name] CHF (congestive heart failure) ([place])   . Hypercholesteremia   . Hypertension   . Hypothyroidism   . ICD (implantable cardiac defibrillator) in place    PROPHYLACTIC      medtronic  . Inferior MI ([place]) "? date"; 2009  . Ischemic cardiomyopathy    a. 10/09 Echo: Sev LV Dysfxn, inf/lat AK, Mod MR.  . Liver disease   . LVAD (left ventricular assist device) present ([place])   . Osteoarthritis    a. s/p R TKR  . Pacemaker   . PVC (premature ventricular contraction)   . RBBB   . Shortness of breath     No current facility-administered medications for this encounter.   Current Outpatient Medications  Medication Sig Dispense Refill  . acetaminophen (TYLENOL) 500 MG chewable tablet Chew 500 mg by mouth every 6 (six) hours as needed for pain.    [name] aspirin EC 81 MG tablet Take 81 mg by mouth daily.    [name]  cholecalciferol (VITAMIN D3) 25 MCG (1000 UT) tablet Take 1,000 Units by mouth daily.    . citalopram (CELEXA) 10 MG tablet Take 1 tablet by mouth once daily 90 tablet 3  . Coenzyme Q10 (COQ10) 200 MG CAPS Take 200 mg by mouth daily.    Arna Medici 137 MCG tablet TAKE ONE-HALF TABLET BY MOUTH ON SUNDAY, THEN TAKE 1 TABLET BY MOUTH ON ALL OTHER DAYS 105 tablet 1  . famotidine (PEPCID) 20 MG tablet Take 20 mg by mouth daily.     . furosemide (LASIX) 20 MG tablet Take 20 mg by mouth daily as needed (Take 20 mg as daily as needed for wt > 153 lbs). Reported on 01/14/2016 (Patient not taking: No sig reported)    . hydrALAZINE (APRESOLINE) 50 MG tablet Take 0.5 tablets (25 mg total) by mouth 3 (three) times daily. 270 tablet  3  . Melatonin 3 MG TABS Take 1 tablet by mouth at bedtime.    . pantoprazole (PROTONIX) 40 MG tablet Take 1 tablet (40 mg total) by mouth daily. 90 tablet 3  . potassium chloride SA (K-DUR,KLOR-CON) 20 MEQ tablet Take 1 tablet (20 mEq total) by mouth daily as needed. Take one potassium pill when you take your lasix. (Patient not taking: No sig reported) 90 tablet 3  . Probiotic Product (PROBIOTIC PO) Take 1 capsule by mouth daily.    . rosuvastatin (CRESTOR) 20 MG tablet Take 1 tablet (20 mg total) by mouth daily. 90 tablet 3  . sotalol (BETAPACE) 80 MG tablet TAKE 1 TABLET (80 MG TOTAL) BY MOUTH DAILY. 90 tablet 3  . traZODone (DESYREL) 50 MG tablet Take 1 tablet (50 mg total) by mouth at bedtime. 30 tablet 5  . warfarin (COUMADIN) 5 MG tablet Take 7.5 mg daily or as directed by HF Clinic 135 tablet 11   Facility-Administered Medications Ordered in Other Encounters  Medication Dose Route Frequency Provider Last Rate Last Admin  . etomidate (AMIDATE) injection    Anesthesia Intra-op Myna Bright, CRNA   20 mg at 02/08/14 0915  . rocuronium [facility]) injection    Anesthesia Intra-op Myna Bright, CRNA   50 mg at 02/08/14 0932  . succinylcholine (ANECTINE) injection    Anesthesia Intra-op Myna Bright, CRNA   100 mg at 02/08/14 0915   Ace inhibitors, Amiodarone, Diovan [valsartan], Lipitor [atorvastatin calcium], and Norvasc [amlodipine]  Review of systems complete and found to be negative unless listed in HPI.     Vital Signs:   Vitals:   12/01/20 1612  BP: (!) 102/93  Pulse: 85  Resp: 15  Temp: 98 F (36.7 C)  SpO2: 99%   MAP 94 checked personally   Physical Exam: General:  NAD.  HEENT: normal  Neck: supple. JVP 8-9 with prominent v waves  Carotids 2+ bilat; no bruits. No lymphadenopathy or thryomegaly appreciated. Cor: LVAD hum.  Lungs: Clear. Abdomen: obese soft, nontender, non-distended. No hepatosplenomegaly. No bruits or masses. Good bowel sounds.  Driveline site clean. Anchor in place.  Extremities: no cyanosis, clubbing, rash. Warm no edema  Neuro: alert & oriented x 3. No focal deficits. Moves all 4 without problem   ECH: NSR with RBBB Personally reviewed   ASSESSMENT AND PLAN:   1) Dizziness  - No VT on monitor. ICD interrogated personally with [facility]. Await full report - No evidence of bleeding. No low flows - Volume status ok on exam and actually has some JVD with prominent v waves.  -  I think he has become more preload dependent due to RV failure - Have encouraged po intake. Will repeat echo as outpatient - Can go home if labs ok   2) VT - In NSR today - Continue sotalol  3) Chronic systolic HF: ICM, EF 0000000 s/p LVAD HM II implant 08/2013 for DT.   - Echo 10/20 EF 10-15% mild AI. Mod RV dysfunction. LVAD cannula OK - Stable NYHA II with VAD support - I think he has become more preload dependent due to RV failure. Check echo as outpatient - Encourage po intake   4) LVAD: HMII 08/2013 for DT. - VAD interrogated personally. Parameters stable. - Admitted in May 2018 with elevated LDH with bival.  - Labs pending  - DL site looks good  5) Chronic anticoagulation: INR goal 2.0-3.0.    - INR pending - Continue ASA 81 mg for LVAD + warfarin.   6) HTN:  - Well controlled. Continue hydralazine for now  7) PAFL - Remains in NSR. On sotalol and warfarin   8) Hypothyroidism:  - Followed by Dr Dwyane Dee. Stable.No change   9) Hyperlipidemia:  - Continue Crestor. Zetia stopped due to cost.  - No change.   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

## 2020-12-01 NOTE — ED Notes (Signed)
RECEIVED VERBAL REPORT FROM [facility] AT THIS TIME

## 2020-12-01 NOTE — ED Provider Notes (Signed)
[facility] EMERGENCY DEPARTMENT Provider Note   CSN: [id number] Arrival date & time: 12/01/20  1554     History Chief Complaint  Patient presents with  . LVAD/dizziness  . Dizziness    Johnny Oneill is a 80 y.o. male.  HPI Patient with a history of LVAD now presents with congestion, slight weakness.  Patient notes no fever, no fall, no syncope.  Onset was maybe a few days ago.  For his congestion, in the context of known implanted left ventricular assist device he presents with his wife for evaluation.  He denies fever, vomiting.  No true pain.    Past Medical History:  Diagnosis Date  . Anxiety   . Automatic implantable cardioverter-defibrillator in situ   . CAD (coronary artery disease)    S/P stenting of LCX in 2004;  s/p Lat MI 2009 with occlusion of the LCX - treated with Promus stenting. Has total occlusion of the RCA.   [name] CHF (congestive heart failure) ([place])   . Hypercholesteremia   . Hypertension   . Hypothyroidism   . ICD (implantable cardiac defibrillator) in place    PROPHYLACTIC      medtronic  . Inferior MI ([place]) "? date"; 2009  . Ischemic cardiomyopathy    a. 10/09 Echo: Sev LV Dysfxn, inf/lat AK, Mod MR.  . Liver disease   . LVAD (left ventricular assist device) present ([place])   . Osteoarthritis    a. s/p R TKR  . Pacemaker   . PVC (premature ventricular contraction)   . RBBB   . Shortness of breath     Patient Active Problem List   Diagnosis Date Noted  . Chronic systolic CHF (congestive heart failure) ([place]) 01/02/2015  . Decreased appetite 11/01/2014  . Contact dermatitis 09/11/2014  . Inguinal hernia 08/21/2014  . Hemothorax 02/08/2014  . HTN (hypertension) 01/30/2014  . Chronic anticoagulation 01/08/2014  . Hypothyroid 12/20/2013  . Syncope 11/28/2013  . Hemothorax on right 11/06/2013  . Poor venous access 11/02/2013  . LVAD (left ventricular assist device) present ([place]) 09/18/2013  . Weakness generalized 09/15/2013  .  Nonspecific (abnormal) findings on radiological and other examination of gastrointestinal tract 09/14/2013  . Anemia, unspecified 09/14/2013  . 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 PVC (premature ventricular contraction) 12/08/2012  . VT (ventricular tachycardia) ([place]) 12/08/2012  . Essential hypertension, benign 11/12/2010  . ISCHEMIC CARDIOMYOPATHY 11/12/2010  . Chronic systolic heart failure ([place]) 11/12/2010  . Ischemic cardiomyopathy   . Myocardial infarction of lateral wall ([place])   . Hypercholesteremia   . Automatic implantable cardioverter-defibrillator in situ   . CAD (coronary artery disease)   . Osteoarthritis     Past Surgical History:  Procedure Laterality Date  . CARPAL TUNNEL RELEASE Right 05/29/2016   Procedure: CARPAL TUNNEL RELEASE;  Surgeon: Earlie Server, MD;  Location: [facility];  Service: Orthopedics;  Laterality: Right;  . CORONARY ANGIOPLASTY WITH STENT PLACEMENT  2004  . CORONARY ANGIOPLASTY WITH STENT PLACEMENT  07/2008  . DEFIBRILLATOR  2009  . ESOPHAGOGASTRODUODENOSCOPY N/A 09/15/2013   Procedure: ESOPHAGOGASTRODUODENOSCOPY (EGD);  Surgeon: Ladene Artist, MD;  Location: [facility] ENDOSCOPY;  Service: Endoscopy;  Laterality: N/A;  bedside  . HEMATOMA EVACUATION Right 11/06/2013   Procedure: EVACUATION HEMATOMA;  Surgeon: Gaye Pollack, MD;  Location: [facility];  Service: Cardiothoracic;  Laterality: Right;  . ICD GENERATOR CHANGEOUT N/A 06/28/2019   Procedure: ICD GENERATOR CHANGEOUT;  Surgeon: Constance Haw, MD;  Location: [facility] CV LAB;  Service: Cardiovascular;  Laterality: N/A;  . INSERT / REPLACE / REMOVE PACEMAKER  2009  . INSERTION OF IMPLANTABLE LEFT VENTRICULAR ASSIST DEVICE N/A 09/18/2013   Procedure: INSERTION OF IMPLANTABLE LEFT VENTRICULAR ASSIST DEVICE;  Surgeon: Ivin Poot, MD;  Location: [facility];  Service: Open Heart Surgery;  Laterality: N/A;  CIRC ARREST  NITRIC OXIDE  . INTRA-AORTIC BALLOON PUMP INSERTION N/A 09/14/2013   Procedure:  INTRA-AORTIC BALLOON PUMP INSERTION;  Surgeon: Jolaine Artist, MD;  Location: [facility] CATH LAB;  Service: Cardiovascular;  Laterality: N/A;  . INTRAOPERATIVE TRANSESOPHAGEAL ECHOCARDIOGRAM N/A 09/18/2013   Procedure: INTRAOPERATIVE TRANSESOPHAGEAL ECHOCARDIOGRAM;  Surgeon: Ivin Poot, MD;  Location: [facility];  Service: Open Heart Surgery;  Laterality: N/A;  . KNEE ARTHROSCOPY     bilaterally  . KNEE ARTHROSCOPY W/ PARTIAL MEDIAL MENISCECTOMY  12/2002   left  . LEFT VENTRICULAR ASSIST DEVICE    . RIGHT HEART CATHETERIZATION N/A 08/25/2013   Procedure: RIGHT HEART CATH;  Surgeon: Jolaine Artist, MD;  Location: [facility] CATH LAB;  Service: Cardiovascular;  Laterality: N/A;  . RIGHT HEART CATHETERIZATION N/A 09/13/2013   Procedure: RIGHT HEART CATH;  Surgeon: Jolaine Artist, MD;  Location: [facility] CATH LAB;  Service: Cardiovascular;  Laterality: N/A;  . RIGHT HEART CATHETERIZATION N/A 02/08/2014   Procedure: RIGHT HEART CATH;  Surgeon: Jolaine Artist, MD;  Location: [facility] CATH LAB;  Service: Cardiovascular;  Laterality: N/A;  . RIGHT HEART CATHETERIZATION N/A 02/12/2014   Procedure: RIGHT HEART CATH;  Surgeon: Jolaine Artist, MD;  Location: [facility] CATH LAB;  Service: Cardiovascular;  Laterality: N/A;  . TOTAL KNEE ARTHROPLASTY  11/27/11   left  . TOTAL KNEE ARTHROPLASTY  11/27/2011   Procedure: TOTAL KNEE ARTHROPLASTY;  Surgeon: Yvette Rack., MD;  Location: [place];  Service: Orthopedics;  Laterality: Left;  left total knee arthroplasty  . VIDEO ASSISTED THORACOSCOPY Right 11/06/2013   Procedure: VIDEO ASSISTED THORACOSCOPY;  Surgeon: Gaye Pollack, MD;  Location: [facility] OR;  Service: Cardiothoracic;  Laterality: Right;       Family History  Problem Relation Age of Onset  . Heart failure Father   . Stroke Father   . Coronary artery disease Mother   . Heart disease Mother   . Hyperlipidemia Sister     Social History   Tobacco Use  . Smoking status: Never Smoker  . Smokeless tobacco: Never Used   Vaping Use  . Vaping Use: Never used  Substance Use Topics  . Alcohol use: No  . Drug use: No    Types: Marijuana    Comment: "back in our younger days we smoked a little pot"    Home Medications Prior to Admission medications   Medication Sig Start Date End Date Taking? Authorizing Provider  acetaminophen (TYLENOL) 500 MG chewable tablet Chew 500 mg by mouth every 6 (six) hours as needed for pain.    [provider]  aspirin EC 81 MG tablet Take 81 mg by mouth daily.    [provider]  cholecalciferol (VITAMIN D3) 25 MCG (1000 UT) tablet Take 1,000 Units by mouth daily.    [provider]  citalopram (CELEXA) 10 MG tablet Take 1 tablet by mouth once daily 03/19/20   Bensimhon, Shaune Pascal, MD  Coenzyme Q10 (COQ10) 200 MG CAPS Take 200 mg by mouth daily.    [provider]  EUTHYROX 137 MCG tablet TAKE ONE-HALF TABLET BY MOUTH ON SUNDAY, THEN TAKE 1 TABLET BY MOUTH ON ALL OTHER DAYS 07/31/20  Elayne Snare, MD  famotidine (PEPCID) 20 MG tablet Take 20 mg by mouth daily.     [provider]  furosemide (LASIX) 20 MG tablet Take 20 mg by mouth daily as needed (Take 20 mg as daily as needed for wt > 153 lbs). Reported on 01/14/2016 Patient not taking: No sig reported    [provider]  hydrALAZINE (APRESOLINE) 50 MG tablet Take 0.5 tablets (25 mg total) by mouth 3 (three) times daily. 10/16/20   Bensimhon, Shaune Pascal, MD  Melatonin 3 MG TABS Take 1 tablet by mouth at bedtime.    [provider]  pantoprazole (PROTONIX) 40 MG tablet Take 1 tablet (40 mg total) by mouth daily. 08/22/20   Bensimhon, Shaune Pascal, MD  potassium chloride SA (K-DUR,KLOR-CON) 20 MEQ tablet Take 1 tablet (20 mEq total) by mouth daily as needed. Take one potassium pill when you take your lasix. Patient not taking: No sig reported 01/02/15   Bensimhon, Shaune Pascal, MD  Probiotic Product (PROBIOTIC PO) Take 1 capsule by mouth daily.    [provider]   rosuvastatin (CRESTOR) 20 MG tablet Take 1 tablet (20 mg total) by mouth daily. 11/26/20   Bensimhon, Shaune Pascal, MD  sotalol (BETAPACE) 80 MG tablet TAKE 1 TABLET (80 MG TOTAL) BY MOUTH DAILY. 07/10/20 07/10/21  Bensimhon, Shaune Pascal, MD  traZODone (DESYREL) 50 MG tablet Take 1 tablet (50 mg total) by mouth at bedtime. 10/31/19   Bensimhon, Shaune Pascal, MD  warfarin (COUMADIN) 5 MG tablet Take 7.5 mg daily or as directed by HF Clinic 11/12/20   Orma Render, RPH-CPP    Allergies    Ace inhibitors, Amiodarone, Diovan [valsartan], Lipitor [atorvastatin calcium], and Norvasc [amlodipine]  Review of Systems   Review of Systems  Physical Exam Updated Vital Signs BP (!) 116/94   Pulse 67   Temp 98 F (36.7 C) (Oral)   Resp 20   Ht '5\' 7"'$  (1.702 m)   Wt 74.4 kg   SpO2 96%   BMI 25.69 kg/m   Physical Exam Vitals and nursing note reviewed.  Constitutional:      General: He is not in acute distress.    Appearance: He is well-developed.  HENT:     Head: Normocephalic and atraumatic.  Eyes:     Conjunctiva/sclera: Conjunctivae normal.  Cardiovascular:     Rate and Rhythm: Normal rate and regular rhythm.     Comments: Appreciable sounds of functional device Pulmonary:     Effort: Pulmonary effort is normal. No respiratory distress.     Breath sounds: No stridor.  Abdominal:     General: There is no distension.     Comments: LVAD device with wires, unremarkable otherwise  Skin:    General: Skin is warm and dry.  Neurological:     Mental Status: He is alert and oriented to person, place, and time.      ED Results / Procedures / Treatments   Labs (all labs ordered are listed, but only abnormal results are displayed) Labs Reviewed  COMPREHENSIVE METABOLIC PANEL - Abnormal; Notable for the following components:      Result Value   Glucose, Bld 107 (*)    BUN 25 (*)    Creatinine, Ser 2.01 (*)    Calcium 8.6 (*)    GFR, Estimated 34 (*)    All other components within normal limits   BRAIN NATRIURETIC PEPTIDE - Abnormal; Notable for the following components:   B Natriuretic Peptide 409.8 (*)  All other components within normal limits  CBC WITH DIFFERENTIAL/PLATELET - Abnormal; Notable for the following components:   RBC 3.90 (*)    Hemoglobin 12.0 (*)    HCT 36.8 (*)    Platelets 146 (*)    All other components within normal limits  PROTIME-INR - Abnormal; Notable for the following components:   Prothrombin Time 27.5 (*)    INR 2.7 (*)    All other components within normal limits  LACTATE DEHYDROGENASE - Abnormal; Notable for the following components:   LDH 227 (*)    All other components within normal limits    EKG None  Radiology DG Chest Port 1 View  Result Date: 12/01/2020 CLINICAL DATA:  80 year old male with LVAD.  Congestion. EXAM: PORTABLE CHEST 1 VIEW COMPARISON:  Chest radiograph dated 10/10/2019. FINDINGS: Left lung base density, likely atelectasis. Pneumonia is not excluded clinical correlation is recommended. The right lung is clear. No pleural effusion or pneumothorax. There is cardiomegaly. Median sternotomy wires and left pectoral AICD device as well as LVAD. No acute osseous pathology. IMPRESSION: Left lung base atelectasis versus less likely infiltrate. Clinical correlation recommended. Electronically Signed   By: Anner Crete M.D.   On: 12/01/2020 17:38    Procedures Procedures   Medications Ordered in ED Medications - No data to display  ED Course  I have reviewed the triage vital signs and the nursing notes.  Pertinent labs & imaging results that were available during my care of the patient were reviewed by me and considered in my medical decision making (see chart for details).   Immediately after the patient's evaluation I discussed his case at bedside with our LVAD physician.  We discussed continuous cardiac monitoring, pulse oximetry, labs, x-ray. On monitor the patient has rate 60s, though with electric interference from his  device, abnormal Pulse oximetry is 98% room air normal  7:14 PM Patient hemodynamically similar situation. Discussed his results with her cardiologist. Patient does have a slight increase in his creatinine, slight elevation in BNP, but no new oxygen requirement, no evidence for respiratory distress per We discussed need for close outpatient follow-up, indications for appropriate fluid rehydration, and the patient was discharged in stable condition. MDM Rules/Calculators/A&P MDM Number of Diagnoses or Management Options Dehydration: new, needed workup Dizziness: new, needed workup   Amount and/or Complexity of Data Reviewed Clinical lab tests: ordered and reviewed Tests in the radiology section of CPT: reviewed and ordered Tests in the medicine section of CPT: reviewed and ordered Discussion of test results with the performing providers: yes Decide to obtain previous medical records or to obtain history from someone other than the patient: yes Obtain history from someone other than the patient: yes Review and summarize past medical records: yes Discuss the patient with other providers: yes Independent visualization of images, tracings, or specimens: yes  Risk of Complications, Morbidity, and/or Mortality Presenting problems: high Diagnostic procedures: high Management options: high  Critical Care Total time providing critical care: < 30 minutes  Patient Progress Patient progress: stable  Final Clinical Impression(s) / ED Diagnoses Final diagnoses:  Dizziness  Dehydration    Rx / DC Orders ED Discharge Orders    None       Carmin Muskrat, MD 12/01/20 1916

## 2020-12-02 ENCOUNTER — Other Ambulatory Visit (HOSPITAL_COMMUNITY): Payer: Self-pay | Admitting: *Deleted

## 2020-12-02 ENCOUNTER — Ambulatory Visit (HOSPITAL_COMMUNITY): Payer: Self-pay | Admitting: Pharmacist

## 2020-12-02 DIAGNOSIS — I5022 Chronic systolic (congestive) heart failure: Secondary | ICD-10-CM

## 2020-12-02 DIAGNOSIS — Z95811 Presence of heart assist device: Secondary | ICD-10-CM

## 2020-12-02 NOTE — Progress Notes (Signed)
LVAD INR 

## 2020-12-03 ENCOUNTER — Other Ambulatory Visit: Payer: Medicare Other

## 2020-12-03 ENCOUNTER — Ambulatory Visit (HOSPITAL_COMMUNITY)
Admission: RE | Admit: 2020-12-03 | Discharge: 2020-12-03 | Disposition: A | Discharge status: Home / Self Care | Payer: Medicare Other | Source: Ambulatory Visit | Attending: Internal Medicine | Admitting: Internal Medicine

## 2020-12-03 ENCOUNTER — Other Ambulatory Visit: Payer: Self-pay

## 2020-12-03 ENCOUNTER — Other Ambulatory Visit (HOSPITAL_COMMUNITY): Payer: Medicare Other

## 2020-12-03 DIAGNOSIS — I5021 Acute systolic (congestive) heart failure: Secondary | ICD-10-CM | POA: Diagnosis not present

## 2020-12-03 DIAGNOSIS — R0602 Shortness of breath: Secondary | ICD-10-CM | POA: Diagnosis not present

## 2020-12-03 DIAGNOSIS — Z95811 Presence of heart assist device: Secondary | ICD-10-CM | POA: Insufficient documentation

## 2020-12-03 DIAGNOSIS — I10 Essential (primary) hypertension: Secondary | ICD-10-CM | POA: Diagnosis not present

## 2020-12-03 DIAGNOSIS — I5022 Chronic systolic (congestive) heart failure: Secondary | ICD-10-CM

## 2020-12-03 LAB — ECHOCARDIOGRAM COMPLETE: S' Lateral: 4.2 cm

## 2020-12-03 NOTE — Progress Notes (Signed)
  Echocardiogram 2D Echocardiogram has been performed.  Johnny Oneill 12/03/2020, 12:03 PM

## 2020-12-03 NOTE — Progress Notes (Signed)
Patient attended the LVAD Support Group today. Jackie Jaleeah Slight, LCSW, CCSW-MCS [phone]  

## 2020-12-06 ENCOUNTER — Ambulatory Visit: Payer: Medicare Other | Admitting: Endocrinology

## 2020-12-19 ENCOUNTER — Other Ambulatory Visit: Payer: Medicare Other

## 2020-12-23 ENCOUNTER — Telehealth (HOSPITAL_COMMUNITY): Payer: Self-pay | Admitting: Licensed Clinical Social Worker

## 2020-12-23 NOTE — Telephone Encounter (Signed)
CSW contacted patient to remind of tomorrow's LVAD Support Group meeting at 1pm. Patient plans to attend. Jackie Latesa Fratto, LCSW, CCSW-MCS [phone]  

## 2020-12-24 ENCOUNTER — Encounter (HOSPITAL_COMMUNITY): Payer: Medicare Other

## 2020-12-24 ENCOUNTER — Ambulatory Visit: Payer: Medicare Other | Admitting: Endocrinology

## 2020-12-24 ENCOUNTER — Other Ambulatory Visit: Payer: Self-pay

## 2020-12-24 ENCOUNTER — Other Ambulatory Visit (INDEPENDENT_AMBULATORY_CARE_PROVIDER_SITE_OTHER): Payer: Medicare Other

## 2020-12-24 DIAGNOSIS — E890 Postprocedural hypothyroidism: Secondary | ICD-10-CM | POA: Diagnosis not present

## 2020-12-25 ENCOUNTER — Encounter (HOSPITAL_COMMUNITY): Payer: Medicare Other

## 2020-12-25 LAB — TSH: TSH: 5.93 u[IU]/mL — ABNORMAL HIGH (ref 0.35–4.50)

## 2020-12-25 LAB — T4, FREE: Free T4: 1.02 ng/dL (ref 0.60–1.60)

## 2020-12-26 ENCOUNTER — Ambulatory Visit (INDEPENDENT_AMBULATORY_CARE_PROVIDER_SITE_OTHER): Payer: Medicare Other

## 2020-12-26 DIAGNOSIS — I472 Ventricular tachycardia, unspecified: Secondary | ICD-10-CM

## 2020-12-26 LAB — CUP PACEART REMOTE DEVICE CHECK
Battery Remaining Longevity: 93 mo
Battery Voltage: 3.01 V
Brady Statistic AP VP Percent: 1.03 %
Brady Statistic AP VS Percent: 60.09 %
Brady Statistic AS VP Percent: 3.01 %
Brady Statistic AS VS Percent: 35.87 %
Brady Statistic RA Percent Paced: 59.21 %
Brady Statistic RV Percent Paced: 3.98 %
Date Time Interrogation Session: 20220428001803
HighPow Impedance: 38 Ohm
HighPow Impedance: 48 Ohm
Implantable Lead Implant Date: 20091103
Implantable Lead Implant Date: 20091103
Implantable Lead Location: 753859
Implantable Lead Location: 753860
Implantable Lead Model: 5076
Implantable Lead Model: 6947
Implantable Pulse Generator Implant Date: 20201028
Lead Channel Impedance Value: 361 Ohm
Lead Channel Impedance Value: 399 Ohm
Lead Channel Impedance Value: 418 Ohm
Lead Channel Pacing Threshold Amplitude: 0.5 V
Lead Channel Pacing Threshold Amplitude: 1.5 V
Lead Channel Pacing Threshold Pulse Width: 0.4 ms
Lead Channel Pacing Threshold Pulse Width: 0.4 ms
Lead Channel Sensing Intrinsic Amplitude: 1.5 mV
Lead Channel Sensing Intrinsic Amplitude: 1.5 mV
Lead Channel Sensing Intrinsic Amplitude: 5 mV
Lead Channel Sensing Intrinsic Amplitude: 5 mV
Lead Channel Setting Pacing Amplitude: 1.5 V
Lead Channel Setting Pacing Amplitude: 2.75 V
Lead Channel Setting Pacing Pulse Width: 0.6 ms
Lead Channel Setting Sensing Sensitivity: 0.3 mV

## 2020-12-27 ENCOUNTER — Other Ambulatory Visit: Payer: Self-pay

## 2020-12-27 ENCOUNTER — Encounter: Payer: Self-pay | Admitting: Endocrinology

## 2020-12-27 ENCOUNTER — Ambulatory Visit (INDEPENDENT_AMBULATORY_CARE_PROVIDER_SITE_OTHER): Payer: Medicare Other | Admitting: Endocrinology

## 2020-12-27 VITALS — BP 108/64 | HR 60 | Ht 67.0 in | Wt 171.0 lb

## 2020-12-27 DIAGNOSIS — E890 Postprocedural hypothyroidism: Secondary | ICD-10-CM | POA: Diagnosis not present

## 2020-12-27 DIAGNOSIS — I2510 Atherosclerotic heart disease of native coronary artery without angina pectoris: Secondary | ICD-10-CM | POA: Diagnosis not present

## 2020-12-27 NOTE — Progress Notes (Signed)
Patient ID: Johnny Oneill, male   DOB: [birth year], 80 y.o.   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  Previously was taking 200 mcg daily after 3/15 when he was hospitalized   Subsequently the dose was adjusted periodically    Current regimen: he has been taking 137 mcg daily with half tablet on Sundays Taking EUTHYROX brand as before  No recent complaints of unusual fatigue, only has symptoms of cold hands but no cold intolerance His weight is about 5 pounds more than last year  He is very regular with taking the tablet in the morning daily 30 minutes before breakfast with water.   Does not take any iron or calcium-containing vitamins at the same time  His TSH which was normal on the last visit is now increased at 5.9 He has not missed any doses lately  Free T4 relatively lower   Wt Readings from Last 3 Encounters:  12/27/20 171 lb (77.6 kg)  12/01/20 164 lb (74.4 kg)  10/25/20 171 lb 6.4 oz (77.7 kg)    Lab Results  Component Value Date   TSH 5.93 (H) 12/24/2020   TSH 1.33 06/04/2020   TSH 0.68 01/26/2020   FREET4 1.02 12/24/2020   FREET4 1.21 06/04/2020   FREET4 1.56 01/26/2020      Allergies as of 12/27/2020      Reactions   Ace Inhibitors    Hypotension and elevated creatinine   Amiodarone Other (See Comments)   Cerebellar side effects   Diovan [valsartan] Other (See Comments)   Hypotension at low dose, hyperkalemia   Lipitor [atorvastatin Calcium] Other (See Comments)   Muscle pain   Norvasc [amlodipine] Other (See Comments)   Put patient to sleep      Medication List        Accurate as of December 27, 2020 10:09 AM. If you have any questions, ask your nurse or doctor.        acetaminophen 500 MG chewable tablet Commonly known as: TYLENOL Chew 500 mg by mouth every 6 (six) hours as needed for pain.   aspirin EC 81 MG tablet Take 81 mg by mouth daily.   cholecalciferol 25 MCG (1000 UNIT) tablet Commonly known as: VITAMIN D3 Take 1,000 Units by mouth daily.   citalopram 10 MG tablet Commonly known as: CELEXA Take 1 tablet by mouth once daily   CoQ10 200 MG Caps Take 200 mg by mouth daily.   Euthyrox 137 MCG tablet Generic drug: levothyroxine TAKE ONE-HALF TABLET BY MOUTH ON SUNDAY, THEN TAKE 1 TABLET BY MOUTH ON ALL OTHER DAYS   famotidine 20 MG tablet Commonly known as: PEPCID Take 20 mg by mouth daily.   furosemide 20 MG tablet Commonly known as: LASIX Take 20 mg by mouth daily as needed (Take 20 mg as daily as needed for wt > 153 lbs). Reported on 01/14/2016   hydrALAZINE 50 MG tablet Commonly known as: APRESOLINE Take 0.5 tablets (25 mg total) by mouth 3 (three) times daily.   melatonin 3 MG Tabs tablet Take 1 tablet by mouth at bedtime.   pantoprazole 40 MG tablet Commonly known as: PROTONIX Take 1  tablet (40 mg total) by mouth daily.   potassium chloride SA 20 MEQ tablet Commonly known as: KLOR-CON Take 1 tablet (20 mEq total) by mouth daily as needed. Take one potassium pill when you take your lasix.   PROBIOTIC PO Take 1 capsule by mouth daily.   rosuvastatin 20 MG tablet Commonly known as: CRESTOR Take 1 tablet (20 mg total) by mouth daily.   sotalol 80 MG tablet Commonly known as: BETAPACE TAKE 1 TABLET (80 MG TOTAL) BY MOUTH DAILY.   traZODone 50 MG tablet Commonly known as: DESYREL Take 1 tablet (50 mg total) by mouth at bedtime.   warfarin 5 MG tablet Commonly known as: COUMADIN Take as directed by the anticoagulation clinic. If you are unsure how to take this medication, talk to your nurse or doctor. Original  instructions: Take 7.5 mg daily or as directed by HF Clinic        Past Medical History:  Diagnosis Date  . Anxiety   . Automatic implantable cardioverter-defibrillator in situ   . CAD (coronary artery disease)    S/P stenting of LCX in 2004;  s/p Lat MI 2009 with occlusion of the LCX - treated with Promus stenting. Has total occlusion of the RCA.   [name] CHF (congestive heart failure) ([place])   . Hypercholesteremia   . Hypertension   . Hypothyroidism   . ICD (implantable cardiac defibrillator) in place    PROPHYLACTIC      medtronic  . Inferior MI ([place]) "? date"; 2009  . Ischemic cardiomyopathy    a. 10/09 Echo: Sev LV Dysfxn, inf/lat AK, Mod MR.  . Liver disease   . LVAD (left ventricular assist device) present ([place])   . Osteoarthritis    a. s/p R TKR  . Pacemaker   . PVC (premature ventricular contraction)   . RBBB   . Shortness of breath     Past Surgical History:  Procedure Laterality Date  . CARPAL TUNNEL RELEASE Right 05/29/2016   Procedure: CARPAL TUNNEL RELEASE;  Surgeon: Earlie Server, MD;  Location: [place];  Service: Orthopedics;  Laterality: Right;  . CORONARY ANGIOPLASTY WITH STENT PLACEMENT  2004  . CORONARY ANGIOPLASTY WITH STENT PLACEMENT  07/2008  . DEFIBRILLATOR  2009  . ESOPHAGOGASTRODUODENOSCOPY N/A 09/15/2013   Procedure: ESOPHAGOGASTRODUODENOSCOPY (EGD);  Surgeon: Ladene Artist, MD;  Location: [facility] ENDOSCOPY;  Service: Endoscopy;  Laterality: N/A;  bedside  . HEMATOMA EVACUATION Right 11/06/2013   Procedure: EVACUATION HEMATOMA;  Surgeon: Gaye Pollack, MD;  Location: [place];  Service: Cardiothoracic;  Laterality: Right;  . ICD GENERATOR CHANGEOUT N/A 06/28/2019   Procedure: ICD GENERATOR CHANGEOUT;  Surgeon: Constance Haw, MD;  Location: [place] CV LAB;  Service: Cardiovascular;  Laterality: N/A;  . INSERT / REPLACE / REMOVE PACEMAKER  2009  . INSERTION OF IMPLANTABLE LEFT VENTRICULAR ASSIST DEVICE N/A 09/18/2013   Procedure: INSERTION OF  IMPLANTABLE LEFT VENTRICULAR ASSIST DEVICE;  Surgeon: Ivin Poot, MD;  Location: [place];  Service: Open Heart Surgery;  Laterality: N/A;  CIRC ARREST  NITRIC OXIDE  . INTRA-AORTIC BALLOON PUMP INSERTION N/A 09/14/2013   Procedure: INTRA-AORTIC BALLOON PUMP INSERTION;  Surgeon: Jolaine Artist, MD;  Location: [facility] CATH LAB;  Service: Cardiovascular;  Laterality: N/A;  . INTRAOPERATIVE TRANSESOPHAGEAL ECHOCARDIOGRAM N/A 09/18/2013   Procedure: INTRAOPERATIVE TRANSESOPHAGEAL ECHOCARDIOGRAM;  Surgeon: Ivin Poot, MD;  Location: [place];  Service: Open Heart Surgery;  Laterality: N/A;  . KNEE ARTHROSCOPY     bilaterally  .  KNEE ARTHROSCOPY W/ PARTIAL MEDIAL MENISCECTOMY  12/2002   left  . LEFT VENTRICULAR ASSIST DEVICE    . RIGHT HEART CATHETERIZATION N/A 08/25/2013   Procedure: RIGHT HEART CATH;  Surgeon: Jolaine Artist, MD;  Location: [facility] CATH LAB;  Service: Cardiovascular;  Laterality: N/A;  . RIGHT HEART CATHETERIZATION N/A 09/13/2013   Procedure: RIGHT HEART CATH;  Surgeon: Jolaine Artist, MD;  Location: [facility] CATH LAB;  Service: Cardiovascular;  Laterality: N/A;  . RIGHT HEART CATHETERIZATION N/A 02/08/2014   Procedure: RIGHT HEART CATH;  Surgeon: Jolaine Artist, MD;  Location: [facility] CATH LAB;  Service: Cardiovascular;  Laterality: N/A;  . RIGHT HEART CATHETERIZATION N/A 02/12/2014   Procedure: RIGHT HEART CATH;  Surgeon: Jolaine Artist, MD;  Location: [facility] CATH LAB;  Service: Cardiovascular;  Laterality: N/A;  . TOTAL KNEE ARTHROPLASTY  11/27/11   left  . TOTAL KNEE ARTHROPLASTY  11/27/2011   Procedure: TOTAL KNEE ARTHROPLASTY;  Surgeon: Yvette Rack., MD;  Location: [place];  Service: Orthopedics;  Laterality: Left;  left total knee arthroplasty  . VIDEO ASSISTED THORACOSCOPY Right 11/06/2013   Procedure: VIDEO ASSISTED THORACOSCOPY;  Surgeon: Gaye Pollack, MD;  Location: [facility] OR;  Service: Cardiothoracic;  Laterality: Right;    Family History  Problem Relation Age of Onset  .  Heart failure Father   . Stroke Father   . Coronary artery disease Mother   . Heart disease Mother   . Hyperlipidemia Sister     Social History:  reports that he has never smoked. He has never used smokeless tobacco. He reports that he does not drink alcohol and does not use drugs.  Allergies:  Allergies  Allergen Reactions  . Ace Inhibitors     Hypotension and elevated creatinine  . Amiodarone Other (See Comments)    Cerebellar side effects  . Diovan [Valsartan] Other (See Comments)    Hypotension at low dose, hyperkalemia  . Lipitor [Atorvastatin Calcium] Other (See Comments)    Muscle pain  . Norvasc [Amlodipine] Other (See Comments)    Put patient to sleep   ROS:  Cardiomyopathy: He has been treated with a ventricle assist device and he is on Coumadin     Examination:   BP 108/64   Pulse 60   Ht '5\' 7"'$  (1.702 m)   Wt 171 lb (77.6 kg)   SpO2 99%   BMI 26.78 kg/m   Biceps reflexes difficult to elicit    Assessment/Plan:   Post ablative Hypothyroidism:   His levothyroxine dose has been more stable now Continues to be taking Euthyrox brand and now using 137 mcg consistently  Although his symptoms are difficult to assess he has not had any unusual fatigue Only minimal weight change His TSH is almost 6 indicating an adequate dose  He will now take 7 tablets a week instead of 6-1/2 using the same prescription Follow-up in 3 months  There are no Patient Instructions on file for this visit.    Elayne Snare 12/27/2020, 10:09 AM

## 2020-12-30 ENCOUNTER — Other Ambulatory Visit (HOSPITAL_COMMUNITY): Payer: Self-pay | Admitting: *Deleted

## 2020-12-30 DIAGNOSIS — Z95811 Presence of heart assist device: Secondary | ICD-10-CM

## 2020-12-30 DIAGNOSIS — I5022 Chronic systolic (congestive) heart failure: Secondary | ICD-10-CM

## 2020-12-30 DIAGNOSIS — Z7901 Long term (current) use of anticoagulants: Secondary | ICD-10-CM

## 2020-12-31 ENCOUNTER — Ambulatory Visit (HOSPITAL_COMMUNITY)
Admission: RE | Admit: 2020-12-31 | Discharge: 2020-12-31 | Disposition: A | Discharge status: Home / Self Care | Payer: Medicare Other | Source: Ambulatory Visit | Attending: Cardiology | Admitting: Cardiology

## 2020-12-31 ENCOUNTER — Other Ambulatory Visit: Payer: Self-pay

## 2020-12-31 ENCOUNTER — Ambulatory Visit (HOSPITAL_COMMUNITY): Payer: Self-pay | Admitting: Pharmacist

## 2020-12-31 VITALS — BP 90/0 | HR 60 | Ht 69.0 in | Wt 172.6 lb

## 2020-12-31 DIAGNOSIS — Z95811 Presence of heart assist device: Secondary | ICD-10-CM

## 2020-12-31 DIAGNOSIS — R42 Dizziness and giddiness: Secondary | ICD-10-CM | POA: Diagnosis not present

## 2020-12-31 DIAGNOSIS — I5022 Chronic systolic (congestive) heart failure: Secondary | ICD-10-CM | POA: Diagnosis not present

## 2020-12-31 DIAGNOSIS — Z7901 Long term (current) use of anticoagulants: Secondary | ICD-10-CM

## 2020-12-31 LAB — PROTIME-INR
INR: 3.1 — ABNORMAL HIGH (ref 0.8–1.2)
Prothrombin Time: 31.6 seconds — ABNORMAL HIGH (ref 11.4–15.2)

## 2020-12-31 LAB — BASIC METABOLIC PANEL
Anion gap: 7 (ref 5–15)
BUN: 24 mg/dL — ABNORMAL HIGH (ref 8–23)
CO2: 24 mmol/L (ref 22–32)
Calcium: 8.8 mg/dL — ABNORMAL LOW (ref 8.9–10.3)
Chloride: 106 mmol/L (ref 98–111)
Creatinine, Ser: 1.94 mg/dL — ABNORMAL HIGH (ref 0.61–1.24)
GFR, Estimated: 35 mL/min — ABNORMAL LOW (ref >60)
Glucose, Bld: 99 mg/dL (ref 70–99)
Potassium: 4.1 mmol/L (ref 3.5–5.1)
Sodium: 137 mmol/L (ref 135–145)

## 2020-12-31 LAB — CBC
HCT: 37.7 % — ABNORMAL LOW (ref 39.0–52.0)
Hemoglobin: 12 g/dL — ABNORMAL LOW (ref 13.0–17.0)
MCH: 29.8 pg (ref 26.0–34.0)
MCHC: 31.8 g/dL (ref 30.0–36.0)
MCV: 93.5 fL (ref 80.0–100.0)
Platelets: 154 10*3/uL (ref 150–400)
RBC: 4.03 MIL/uL — ABNORMAL LOW (ref 4.22–5.81)
RDW: 15.5 % (ref 11.5–15.5)
WBC: 8.8 10*3/uL (ref 4.0–10.5)
nRBC: 0 % (ref 0.0–0.2)

## 2020-12-31 LAB — MAGNESIUM: Magnesium: 1.9 mg/dL (ref 1.7–2.4)

## 2020-12-31 LAB — LACTATE DEHYDROGENASE: LDH: 221 U/L — ABNORMAL HIGH (ref 98–192)

## 2020-12-31 MED ORDER — TRAZODONE HCL 50 MG PO TABS: 50.0000 mg | ORAL_TABLET | Freq: Every day | ORAL | 5 refills | Status: DC

## 2020-12-31 MED ORDER — SILDENAFIL CITRATE 20 MG PO TABS: 20.0000 mg | ORAL_TABLET | Freq: Three times a day (TID) | ORAL | 6 refills | Status: DC

## 2020-12-31 NOTE — Progress Notes (Signed)
LVAD INR 

## 2020-12-31 NOTE — Progress Notes (Addendum)
Patient presents for 2 mo f/u visit in [facility] today alone. Reports no problems with VAD equipment or concerns with drive line.   Pt states that his dizziness is less. He states that he mainly has dizziness when he bends over or if he stands up too quickly.   Pt states that he has not taken any Lasix since he was last seen in clinic.   Pt had echo on 12/03/20, moderate RV dysfunction - we will start Sildenafil 20 mg tid per Dr. Haroldine Laws today. Will RAMP pt on Friday to see if we can increase speed.   Pt states that his right knee is continuing to get worse. Pt is asking if we would be a candidate for a knee replacement. Pt was instructed that he would have a bleeding risk with the coumadin. Pt informed that we can refer him to an orthopedist when he is ready.   Pt is requesting a refill on his Trazodone - provided.  Vital Signs:   Doppler Pressure: 90 Automatc BP: 115/72 (87) HR: 60 SPO2: 98% on RA   Weight: 172.6lb w/ eqt Last weight: 171.4 lb  VAD Indication: Destination therapy- patient choice     VAD interrogation & Equipment Management: Speed: 9200 Flow: 4.1 Power: 5.0w    PI: 7.3   Alarms: none Events: 5-10 PI events daily  Fixed speed 9200  Low speed limit: 8600   Primary Controller:  Replace back up battery in 21 months Back up controller:  Replace back up battery in 31 months -- did not bring today   Annual Equipment Maintenance on UBC/PM was performed on 10/2020.   I reviewed the LVAD parameters from today and compared the results to the patient's prior recorded data. LVAD interrogation was NEGATIVE for significant power changes, NEGATIVE for clinical alarms and STABLE for PI events/speed drops. No programming changes were made and pump is functioning within specified parameters. Pt is performing daily controller and system monitor self tests along with completing weekly and monthly maintenance for LVAD equipment.  Exit Site Care: Drive line is being maintained  weekly by [name], his wife. Dressing dry and intact. Stabilization device present and accurately applied. Pt denies fever or chills. Pt has CHG allergy. Pt has adequate dressing supplies at home.   Significant Events on VAD Support:  10/2013>SBO 12/2016> elevated LDH, admit for Bival   Device: Dual Medtronic Therapies:  V therapy on at 222 bpm; V monitor on 171         A therapy on 171 bpm (ramp and burst overdrive paing); A monitor on 300 bpm  Pacing: AAI<=>DDD 60 bpm  Last check: 03/28/20  BP & Labs:  Doppler 90 - Doppler is reflecting map  Hgb 12 - No S/S of bleeding. Specifically denies melena/BRBPR or nosebleeds.   LDH 221- established baseline of 200- 350. Denies tea-colored urine. No power elevations noted on interrogation.    Patient Instructions:  1. Please start sildenafil 20 mg three times a day 2. We will schedule a RAMP echo this week 3. Return to [place] [facility] in 2 months  Tanda Rockers RN [place] Coordinator  Office: [phone]  24/7 Pager: [phone]

## 2020-12-31 NOTE — Patient Instructions (Addendum)
1. Please start sildenafil 20 mg three times a day 2. We will schedule a RAMP echo this week 3. Return to [place] clinic in 2 months

## 2021-01-03 ENCOUNTER — Other Ambulatory Visit: Payer: Self-pay

## 2021-01-03 ENCOUNTER — Ambulatory Visit (HOSPITAL_COMMUNITY)
Admission: RE | Admit: 2021-01-03 | Discharge: 2021-01-03 | Disposition: A | Discharge status: Home / Self Care | Payer: Medicare Other | Source: Ambulatory Visit | Attending: Internal Medicine | Admitting: Internal Medicine

## 2021-01-03 ENCOUNTER — Ambulatory Visit (HOSPITAL_COMMUNITY)
Admission: RE | Admit: 2021-01-03 | Discharge: 2021-01-03 | Disposition: A | Discharge status: Home / Self Care | Payer: Medicare Other | Source: Ambulatory Visit

## 2021-01-03 VITALS — BP 115/75

## 2021-01-03 DIAGNOSIS — Z95811 Presence of heart assist device: Secondary | ICD-10-CM

## 2021-01-03 DIAGNOSIS — I5022 Chronic systolic (congestive) heart failure: Secondary | ICD-10-CM | POA: Diagnosis not present

## 2021-01-03 NOTE — Progress Notes (Signed)
Speed  Flow  PI  Power  LVIDD  AI  Aortic openings  MR  TR  Septum  RV   9200 4.4 6.7 5.4 5.4 mild 4/5  trace  none Pulling slightly to RV  Mild/mod  9400  4.2 6.9 5.3 5.5 Mild/mod 3/5 mild trace Tiny pull to RV mild   9600 3.9 5.6 5.3 5.5 mod 2/5 trace trace Midline-starting to pull in  mild  9800  4.2 6.0 5.9 5.3 mod 2/5   Pulling in                              Doppler MAP: 92 VTI: 9.73   Ramp ECHO performed at bedside. Backup controller programmed to reflect the following:   At completion of ramp study:   Fixed speed: 9400 Low speed limit: 8800  Plan: 1. We have increased your speed today 2. Stop Sildenafil 3. Return to clinic in 2 months 4. Below is traveling emergency info for your upcoming trip:  [facility] [address] [place] [address] [place]  [phone] Get Directions (6.16m)  STanda RockersRN, VAD Coordinator 24/7 pager [phone]

## 2021-01-03 NOTE — Patient Instructions (Addendum)
1. We have increased your speed today 2. Stop Sildenafil 3. Return to clinic in 2 months 4. Below is traveling emergency info for your upcoming trip:  [facility] [address], [place] [address] [place]  [phone] Get Directions (6.47m)

## 2021-01-07 ENCOUNTER — Other Ambulatory Visit (HOSPITAL_COMMUNITY): Payer: Self-pay | Admitting: *Deleted

## 2021-01-07 ENCOUNTER — Ambulatory Visit (HOSPITAL_COMMUNITY)
Admission: RE | Admit: 2021-01-07 | Discharge: 2021-01-07 | Disposition: A | Discharge status: Home / Self Care | Payer: Medicare Other | Source: Ambulatory Visit | Attending: Internal Medicine | Admitting: Internal Medicine

## 2021-01-07 ENCOUNTER — Other Ambulatory Visit: Payer: Self-pay

## 2021-01-07 DIAGNOSIS — M79606 Pain in leg, unspecified: Secondary | ICD-10-CM

## 2021-01-07 DIAGNOSIS — M7989 Other specified soft tissue disorders: Secondary | ICD-10-CM | POA: Diagnosis not present

## 2021-01-07 DIAGNOSIS — Z95811 Presence of heart assist device: Secondary | ICD-10-CM | POA: Diagnosis not present

## 2021-01-07 NOTE — Progress Notes (Addendum)
Patient presents for sick visit due to left calf pain in [facility] Clinic today alone. Reports no problems with VAD equipment or concerns with drive line.   Pt states he had a left calf muscle cramp in the middle of the night on Friday night, and his leg has hurt ever since. He has tried rest, heat, elevation, and a muscle relaxer with minimal relief. Rates pain 5 or 6/10.   Lower extremity ultrasound completed this afternoon:  RIGHT:  - There is no evidence of deep vein thrombosis in the lower extremity.    - A complex cystic structure is found in the popliteal fossa.    LEFT:  - There is no evidence of deep vein thrombosis in the lower extremity.  However, portions of this examination were limited- see technologist  comments above.    - No cystic structure found in the popliteal fossa.    - Focal, isolated fluid collection with internal echogenicities at mid  gastrocnemius muscle left calf.    Pulsatile venous flow is suggestive of possibly elevated right heart  pressure.   Pt plans to call Dr Lorenz Coaster (orthopedist) to make an appointment for evaluation. Per Dr Haroldine Laws place compression stocking on that leg. If pain does not improved instructed to notify VAD coordinators.   Denies lightheadedness/dizziness (other than when he stands up too fast), denies shortness of breath, pedal edema, palpitations, or ICD shocks.   Vital Signs:   Doppler Pressure:  Automatc BP: 113/76 (91) HR: 71 SPO2: 95% on RA   Weight:  lb w/ eqt Last weight: 171.4 lb  VAD Indication: Destination therapy- patient choice     VAD interrogation & Equipment Management: Speed: 9200 Flow: 4.5 Power: 5.4w    PI: 6.0   Alarms: few LV advisories Events: 10-30 PI events daily  Fixed speed 9200  Low speed limit: 8600   Primary Controller:  Replace back up battery in 21 months Back up controller:  Replace back up battery in 31 months -- did not bring today   Annual Equipment Maintenance on UBC/PM was  performed on 10/2020.   I reviewed the LVAD parameters from today and compared the results to the patient's prior recorded data. LVAD interrogation was NEGATIVE for significant power changes, NEGATIVE for clinical alarms and STABLE for PI events/speed drops. No programming changes were made and pump is functioning within specified parameters. Pt is performing daily controller and system monitor self tests along with completing weekly and monthly maintenance for LVAD equipment.  Exit Site Care: Drive line is being maintained weekly by [name], his wife. Dressing dry and intact. Stabilization device present and accurately applied. Pt denies fever or chills. Pt has CHG allergy. Pt has adequate dressing supplies at home.   Significant Events on VAD Support:  10/2013>SBO 12/2016> elevated LDH, admit for Bival   Device: Dual Medtronic Therapies:  V therapy on at 222 bpm; V monitor on 171         A therapy on 171 bpm (ramp and burst overdrive paing); A monitor on 300 bpm  Pacing: AAI<=>DDD 60 bpm  Last check: 03/28/20  BP & Labs:  Doppler  - Doppler is reflecting map  Hgb not drawn - No S/S of bleeding. Specifically denies melena/BRBPR or nosebleeds.   LDH not drawn today- established baseline of 200- 350. Denies tea-colored urine. No power elevations noted on interrogation.    Patient Instructions:  1. No medication changes today 2. Return to [facility] for previously scheduled appointment   Ebony Hail  Raley Novicki RN [place] Coordinator  Office: [phone]  24/7 Pager: [phone]

## 2021-01-07 NOTE — Progress Notes (Signed)
Lower extremity venous bilateral study completed.  Preliminary results relayed to RN for VAD.   See CV Proc for preliminary results report.   Darlin Coco, RDMS, RVT

## 2021-01-07 NOTE — Progress Notes (Signed)
Received call from patient's wife [name] stating patient experience "a severe leg cramp" on Friday and his calf has been "solid as a rock," swollen, and very painful since then. They have tried rest, heat, elevation, and a muscle relaxer without relief. Denies redness, numbness, or tingling.   Discussed with Dr Haroldine Laws and lower extremity ultrasound ordered for this afternoon. Pt aware of appt scheduled at 3:30. Instructed to wait for results following ultrasound. He verbalized understanding.   Emerson Monte RN [place] Coordinator  Office: [phone]  24/7 Pager: [phone]

## 2021-01-08 ENCOUNTER — Encounter (HOSPITAL_COMMUNITY): Payer: 59

## 2021-01-14 DIAGNOSIS — M79662 Pain in left lower leg: Secondary | ICD-10-CM | POA: Diagnosis not present

## 2021-01-15 NOTE — Progress Notes (Signed)
Remote ICD transmission.   

## 2021-01-17 ENCOUNTER — Other Ambulatory Visit (HOSPITAL_COMMUNITY): Payer: Self-pay | Admitting: *Deleted

## 2021-01-17 DIAGNOSIS — Z95811 Presence of heart assist device: Secondary | ICD-10-CM

## 2021-01-17 DIAGNOSIS — M7989 Other specified soft tissue disorders: Secondary | ICD-10-CM

## 2021-01-17 DIAGNOSIS — Z7901 Long term (current) use of anticoagulants: Secondary | ICD-10-CM

## 2021-01-21 ENCOUNTER — Ambulatory Visit (HOSPITAL_COMMUNITY): Payer: Self-pay

## 2021-01-21 ENCOUNTER — Other Ambulatory Visit: Payer: Self-pay

## 2021-01-21 ENCOUNTER — Ambulatory Visit (HOSPITAL_COMMUNITY)
Admission: RE | Admit: 2021-01-21 | Discharge: 2021-01-21 | Disposition: A | Discharge status: Home / Self Care | Payer: Medicare Other | Source: Ambulatory Visit | Attending: Cardiology | Admitting: Cardiology

## 2021-01-21 DIAGNOSIS — Z5181 Encounter for therapeutic drug level monitoring: Secondary | ICD-10-CM | POA: Diagnosis not present

## 2021-01-21 DIAGNOSIS — Z95811 Presence of heart assist device: Secondary | ICD-10-CM | POA: Diagnosis not present

## 2021-01-21 DIAGNOSIS — Z7901 Long term (current) use of anticoagulants: Secondary | ICD-10-CM

## 2021-01-21 LAB — PROTIME-INR
INR: 2.4 — ABNORMAL HIGH (ref 0.8–1.2)
Prothrombin Time: 26.2 seconds — ABNORMAL HIGH (ref 11.4–15.2)

## 2021-01-21 NOTE — Progress Notes (Signed)
LVAD INR 

## 2021-02-04 ENCOUNTER — Other Ambulatory Visit (HOSPITAL_COMMUNITY): Payer: Self-pay | Admitting: *Deleted

## 2021-02-04 ENCOUNTER — Telehealth: Payer: Self-pay | Admitting: Emergency Medicine

## 2021-02-04 ENCOUNTER — Telehealth: Payer: Self-pay | Admitting: Endocrinology

## 2021-02-04 DIAGNOSIS — E890 Postprocedural hypothyroidism: Secondary | ICD-10-CM

## 2021-02-04 DIAGNOSIS — Z95811 Presence of heart assist device: Secondary | ICD-10-CM

## 2021-02-04 DIAGNOSIS — Z7901 Long term (current) use of anticoagulants: Secondary | ICD-10-CM

## 2021-02-04 NOTE — Telephone Encounter (Signed)
Arrange for cardioversion please. We can try to pace him out prior to shocking him

## 2021-02-04 NOTE — Telephone Encounter (Signed)
Received alert that patient is in new onset AFL. He has LVAD. Meds include sotolol 80 mg BID, Coumadin. Patient reports he is asymptomatic and has missed no doses of medication. Remote transmission sent that confirmed patient is still in AFL/ VP event. Patient has VAD follow-up on 03/04/21. Overdue for follow-up with Dr Curt Bears. Will send to scheduler to have appointment scheduled.

## 2021-02-04 NOTE — Progress Notes (Signed)
Received notification that patient is in new onset atrial flutter. Pt is taking all medications as prescribed. Dr Haroldine Laws aware. Pt is scheduled for cardioversion 02/06/21 at 11:30. Plan for INR check tomorrow in HF clinic. Instructed pt and his wife about procedure, need to arrive at admitting by 9:30 am on Thursday, and to be NPO past MN on Wednesday. They verbalized understanding of all the above.   Emerson Monte RN [place] Coordinator  Office: [phone]  24/7 Pager: [phone]

## 2021-02-04 NOTE — Telephone Encounter (Signed)
Pt is needing a refill for EUTHYROX 137 MCG tablet  [place], [place]

## 2021-02-05 ENCOUNTER — Ambulatory Visit (HOSPITAL_COMMUNITY): Payer: Self-pay

## 2021-02-05 ENCOUNTER — Ambulatory Visit (HOSPITAL_COMMUNITY)
Admission: RE | Admit: 2021-02-05 | Discharge: 2021-02-05 | Disposition: A | Discharge status: Home / Self Care | Payer: Medicare Other | Source: Ambulatory Visit | Attending: Cardiology | Admitting: Cardiology

## 2021-02-05 ENCOUNTER — Other Ambulatory Visit: Payer: Self-pay

## 2021-02-05 DIAGNOSIS — Z7901 Long term (current) use of anticoagulants: Secondary | ICD-10-CM | POA: Diagnosis not present

## 2021-02-05 DIAGNOSIS — Z95811 Presence of heart assist device: Secondary | ICD-10-CM | POA: Diagnosis not present

## 2021-02-05 LAB — PROTIME-INR
INR: 3.3 — ABNORMAL HIGH (ref 0.8–1.2)
Prothrombin Time: 33.4 seconds — ABNORMAL HIGH (ref 11.4–15.2)

## 2021-02-05 NOTE — Telephone Encounter (Signed)
Successful telephone encounter to [employer]. Faist to follow up on his request to "cancel or push out his cardioversion" secondary to being told today he was in a "normal rhythm".   Patient states he presented to the HF clinic lab today for pre-procedure labs. EKG performed. Was told he was in "normal rhythm" and is waiting on a call back to see if he could cancel his cardioversion. He called [place] triage inadvertently. Informed patient that he would need to discuss with HF clinic. Message routed with high priority. Of note patient remained in AT/AFL during remote transmission 03/06/21 with controlled paced V rates of 74. Max A rate 214. Patient verbalized understanding and will await further instructions by HF clinic.

## 2021-02-05 NOTE — Telephone Encounter (Signed)
Patient is following up. He states he is back in rhythm and his HR was 72 this morning. He would like to know if it is alright to cancel or push out cardioversion. Please advise.

## 2021-02-06 ENCOUNTER — Encounter (HOSPITAL_COMMUNITY): Payer: Self-pay | Admitting: Internal Medicine

## 2021-02-06 ENCOUNTER — Encounter (HOSPITAL_COMMUNITY): Payer: Self-pay | Admitting: Anesthesiology

## 2021-02-06 ENCOUNTER — Other Ambulatory Visit: Payer: Self-pay

## 2021-02-06 ENCOUNTER — Encounter (HOSPITAL_COMMUNITY)
Admission: RE | Disposition: A | Discharge status: Home / Self Care | Payer: Self-pay | Source: Home / Self Care | Attending: Internal Medicine

## 2021-02-06 ENCOUNTER — Ambulatory Visit (HOSPITAL_COMMUNITY)
Admission: RE | Admit: 2021-02-06 | Discharge: 2021-02-06 | Disposition: A | Discharge status: Home / Self Care | Payer: Medicare Other | Attending: Internal Medicine | Admitting: Internal Medicine

## 2021-02-06 DIAGNOSIS — Z95811 Presence of heart assist device: Secondary | ICD-10-CM

## 2021-02-06 DIAGNOSIS — I2510 Atherosclerotic heart disease of native coronary artery without angina pectoris: Secondary | ICD-10-CM | POA: Insufficient documentation

## 2021-02-06 DIAGNOSIS — N1832 Chronic kidney disease, stage 3b: Secondary | ICD-10-CM | POA: Diagnosis not present

## 2021-02-06 DIAGNOSIS — Z7982 Long term (current) use of aspirin: Secondary | ICD-10-CM | POA: Insufficient documentation

## 2021-02-06 DIAGNOSIS — I472 Ventricular tachycardia: Secondary | ICD-10-CM | POA: Diagnosis not present

## 2021-02-06 DIAGNOSIS — Z79899 Other long term (current) drug therapy: Secondary | ICD-10-CM | POA: Diagnosis not present

## 2021-02-06 DIAGNOSIS — Z79890 Hormone replacement therapy (postmenopausal): Secondary | ICD-10-CM | POA: Diagnosis not present

## 2021-02-06 DIAGNOSIS — E785 Hyperlipidemia, unspecified: Secondary | ICD-10-CM | POA: Diagnosis not present

## 2021-02-06 DIAGNOSIS — Z95810 Presence of automatic (implantable) cardiac defibrillator: Secondary | ICD-10-CM | POA: Insufficient documentation

## 2021-02-06 DIAGNOSIS — I130 Hypertensive heart and chronic kidney disease with heart failure and stage 1 through stage 4 chronic kidney disease, or unspecified chronic kidney disease: Secondary | ICD-10-CM | POA: Diagnosis not present

## 2021-02-06 DIAGNOSIS — Z4502 Encounter for adjustment and management of automatic implantable cardiac defibrillator: Secondary | ICD-10-CM | POA: Insufficient documentation

## 2021-02-06 DIAGNOSIS — E039 Hypothyroidism, unspecified: Secondary | ICD-10-CM | POA: Insufficient documentation

## 2021-02-06 DIAGNOSIS — I5022 Chronic systolic (congestive) heart failure: Secondary | ICD-10-CM | POA: Diagnosis not present

## 2021-02-06 DIAGNOSIS — Z7901 Long term (current) use of anticoagulants: Secondary | ICD-10-CM | POA: Diagnosis not present

## 2021-02-06 DIAGNOSIS — I4892 Unspecified atrial flutter: Secondary | ICD-10-CM | POA: Diagnosis not present

## 2021-02-06 LAB — POCT I-STAT, CHEM 8
BUN: 25 mg/dL — ABNORMAL HIGH (ref 8–23)
Calcium, Ion: 1.2 mmol/L (ref 1.15–1.40)
Chloride: 106 mmol/L (ref 98–111)
Creatinine, Ser: 2 mg/dL — ABNORMAL HIGH (ref 0.61–1.24)
Glucose, Bld: 99 mg/dL (ref 70–99)
HCT: 38 % — ABNORMAL LOW (ref 39.0–52.0)
Hemoglobin: 12.9 g/dL — ABNORMAL LOW (ref 13.0–17.0)
Potassium: 4.4 mmol/L (ref 3.5–5.1)
Sodium: 140 mmol/L (ref 135–145)
TCO2: 24 mmol/L (ref 22–32)

## 2021-02-06 SURGERY — CANCELLED PROCEDURE

## 2021-02-06 MED ORDER — LEVOTHYROXINE SODIUM 137 MCG PO TABS: 137.0000 ug | ORAL_TABLET | Freq: Every day | ORAL | 1 refills | Status: DC

## 2021-02-06 NOTE — Telephone Encounter (Signed)
Refill as requested 

## 2021-02-06 NOTE — Progress Notes (Signed)
Medtronic rep at bedside and able to pace pt out of afib with MD Bensimhon at bedside

## 2021-02-06 NOTE — Progress Notes (Signed)
Pt presented to Endo today for possible cardioversion from aflutter. On my arrival:  BP: 126/104 (110) HR: 74 - aflutter  VAD interrogation: Speed: 9400 Flow: 3.3 Power: 4.8w    PI: 6.7  Med[employer]onic rep at bedside, Dr. Haroldine Laws able to pace pt out of aflutter into NSR.  BP: 121/106 (110) HR: 65 NSR  VAD interrogation: Speed: 9400 Flow: 3.7 Power: 4.9w    PI: 7.3   Pt has f/u on 6/14 for labs and EKG in the VAD clinic.  Tanda Rockers RN, BSN VAD Coordinator 24/7 Pager [phone]

## 2021-02-06 NOTE — Anesthesia Preprocedure Evaluation (Deleted)
Anesthesia Evaluation  Patient identified by MRN, date of birth, ID band Patient awake    Reviewed: Allergy & Precautions, NPO status , Patient's Chart, lab work & pertinent test results  Airway Mallampati: II  TM Distance: >3 FB Neck ROM: Full    Dental  (+) Teeth Intact   Pulmonary    breath sounds clear to auscultation       Cardiovascular hypertension,  Rhythm:Irregular  Mechanical hum continuous   Neuro/Psych    GI/Hepatic   Endo/Other    Renal/GU      Musculoskeletal   Abdominal   Peds  Hematology   Anesthesia Other Findings   Reproductive/Obstetrics                            Anesthesia Physical Anesthesia Plan  ASA: 3  Anesthesia Plan: General   Post-op Pain Management:    Induction: Intravenous  PONV Risk Score and Plan: Propofol infusion  Airway Management Planned: Mask  Additional Equipment:   Intra-op Plan:   Post-operative Plan:   Informed Consent: I have reviewed the patients History and Physical, chart, labs and discussed the procedure including the risks, benefits and alternatives for the proposed anesthesia with the patient or authorized representative who has indicated his/her understanding and acceptance.       Plan Discussed with: CRNA and Anesthesiologist  Anesthesia Plan Comments:         Anesthesia Quick Evaluation

## 2021-02-06 NOTE — Progress Notes (Signed)
VAD CLINIC NOTE   HPI:  Johnny Oneill is a 80 y.o. 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   Admitted 3/4-3/25/15 with SOB and syncopal episode. Found to have SBO and NG placed and started on TPN. On 11/06/13 developed progressive SOB and hypotension and co-ox 51% and Hgb 6.7. Started on milrinone and transfused. Echo showed nl RV function. Patient had PEA arrest and was intubated and started on pressors. CXR revealed a R hemothorax and a R CT placed. He required VATs and evacuation of hemothorax and was then teansferred to CIR.   LVAD Issues  Elevated LDH--bivalirudin. D/C LDH 188  Presented to ER on 06/27/19 with syncopal episode and head injury from fall. ECG showed VT @ 240. Underwent emergent DC-CV in ER. Head CT with no bleed. Was admitted and had ICD gen replacement (EOL). Had small hematoma at site. Amio started. Post-discharge found to have episode of AFL on ICD remote check but has since resolved.    On 04/30/20 he was seen in the VAD clinic and spiro was stopped and jardiance was started.  He reports increased urine output after starting.   Had ICD shock for VT on 06/09/20. Started on sotalol   He presents today for possible DC-CV for recurrent AFlL. Feels ok. Stamin reduced due to AFL. No CP, edema, orthopnea or PND. No VAD alarms  With help of MDT rep, patient paced out of AFL with burst pacing. NSR confirmed by device interrogation. Atrial therapies enabled.   In Atrial Flutter    BP: 126/104 (110) HR: 74 - aflutter   VAD interrogation: Speed: 9400 Flow: 3.3 Power: 4.8w    PI: 6.7   Medtronic rep at bedside, Dr. Haroldine Laws able to pace pt out of aflutter into NSR.   In NSR  BP: 121/106 (110) HR: 65 NSR    VAD interrogation: Speed: 9400 Flow: 3.7 Power: 4.9w    PI: 7.3     Exit Site Care: Drive line is being maintained weekly by [name], his wife. Dressing dry and  intact. Stabilization device present and accurately applied. Pt denies fever or chills. Pt has CHG allergy. Pt has sufficient dressing kits at home.       Past Medical History:  Diagnosis Date   Anxiety    Automatic implantable cardioverter-defibrillator in situ    CAD (coronary artery disease)    S/P stenting of LCX in 2004;  s/p Lat MI 2009 with occlusion of the LCX - treated with Promus stenting. Has total occlusion of the RCA.    CHF (congestive heart failure) (HCC)    Hypercholesteremia    Hypertension    Hypothyroidism    ICD (implantable cardiac defibrillator) in place    PROPHYLACTIC      medtronic   Inferior MI ([place]) "? date"; 2009   Ischemic cardiomyopathy    a. 10/09 Echo: Sev LV Dysfxn, inf/lat AK, Mod MR.   Liver disease    LVAD (left ventricular assist device) present ([place])    Osteoarthritis    a. s/p R TKR   Pacemaker    PVC (premature ventricular contraction)    RBBB    Shortness of breath     No current facility-administered medications for this encounter.   Current Outpatient Medications  Medication Sig Dispense Refill   acetaminophen (TYLENOL) 500 MG tablet Take 500-1,000 mg by mouth every 6 (six) hours as needed (pain).  aspirin EC 81 MG tablet Take 81 mg by mouth in the morning.     cholecalciferol (VITAMIN D3) 25 MCG (1000 UT) tablet Take 1,000 Units by mouth in the morning.     citalopram (CELEXA) 10 MG tablet Take 1 tablet by mouth once daily (Patient taking differently: Take 10 mg by mouth in the morning.) 90 tablet 3   Coenzyme Q10 (COQ10) 200 MG CAPS Take 200 mg by mouth in the morning.     diphenhydrAMINE (SOMINEX) 25 MG tablet Take 25 mg by mouth at bedtime as needed for sleep.     famotidine (PEPCID) 20 MG tablet Take 20 mg by mouth in the morning.     furosemide (LASIX) 20 MG tablet Take 20 mg by mouth daily as needed (Take 20 mg as daily as needed for wt > 153 lbs). Reported on 01/14/2016     hydrALAZINE (APRESOLINE) 50 MG tablet Take 0.5  tablets (25 mg total) by mouth 3 (three) times daily. 270 tablet 3   ibuprofen (ADVIL) 200 MG tablet Take 200 mg by mouth daily as needed (muscle strain/pain.).     levothyroxine (EUTHYROX) 137 MCG tablet Take 1 tablet (137 mcg total) by mouth daily before breakfast. 90 tablet 1   pantoprazole (PROTONIX) 40 MG tablet Take 1 tablet (40 mg total) by mouth daily. (Patient taking differently: Take 40 mg by mouth in the morning.) 90 tablet 3   potassium chloride SA (K-DUR,KLOR-CON) 20 MEQ tablet Take 1 tablet (20 mEq total) by mouth daily as needed. Take one potassium pill when you take your lasix. (Patient taking differently: Take 20 mEq by mouth daily as needed (fluid retention (with lasix)).) 90 tablet 3   Probiotic Product (PROBIOTIC PO) Take 1 capsule by mouth in the morning.     rosuvastatin (CRESTOR) 20 MG tablet Take 1 tablet (20 mg total) by mouth daily. (Patient taking differently: Take 20 mg by mouth in the morning.) 90 tablet 3   sotalol (BETAPACE) 80 MG tablet TAKE 1 TABLET (80 MG TOTAL) BY MOUTH DAILY. (Patient taking differently: Take 80 mg by mouth in the morning.) 90 tablet 3   traZODone (DESYREL) 50 MG tablet Take 1 tablet (50 mg total) by mouth at bedtime. (Patient taking differently: Take 50 mg by mouth at bedtime as needed for sleep.) 30 tablet 5   warfarin (COUMADIN) 5 MG tablet Take 7.5 mg daily or as directed by HF Clinic (Patient taking differently: Take 7.5 mg by mouth every evening.) 135 tablet 11   Facility-Administered Medications Ordered in Other Encounters  Medication Dose Route Frequency Provider Last Rate Last Admin   etomidate (AMIDATE) injection    Anesthesia Intra-op Myna Bright, CRNA   20 mg at 02/08/14 0915   rocuronium [facility]) injection    Anesthesia Intra-op Myna Bright, CRNA   50 mg at 02/08/14 0932   succinylcholine (ANECTINE) injection    Anesthesia Intra-op Myna Bright, CRNA   100 mg at 02/08/14 0915   Ace inhibitors, Amiodarone, Diovan  [valsartan], Lipitor [atorvastatin calcium], Jardiance [empagliflozin], and Norvasc [amlodipine]  Review of systems complete and found to be negative unless listed in HPI.      Vital Signs:  Doppler Pressure: 84 Vitals:   02/06/21 1116  BP: (!) 121/95  Pulse: 71  Resp: 17  Temp: 97.9 F (36.6 C)  SpO2: 100%  SPO2: 98% on room air.  RA      Vital Signs:   Doppler Pressure: 110 Automatc BP: 113/75 (91)  HR: 60 SPO2: 100% on RA   Weight: 171.4 lb w/ eqt Last weight: 167.4 lb    Physical Exam: General:  NAD.  HEENT: normal  Neck: supple. JVP not elevated.  Carotids 2+ bilat; no bruits. No lymphadenopathy or thryomegaly appreciated. Cor: LVAD hum.  Lungs: Clear. Abdomen: soft, nontender, non-distended. No hepatosplenomegaly. No bruits or masses. Good bowel sounds. Driveline site clean. Anchor in place.  Extremities: no cyanosis, clubbing, rash. Warm no edema  Neuro: alert & oriented x 3. No focal deficits. Moves all 4 without problem      ASSESSMENT AND PLAN:   1. Atrial flutter, recurrent - patient paced out of AFL today. Atrial therapies enabled  - continue sotalol (on for VT) - Continue warfarin INR 3.3 - may need to consider ablation  2) Chronic systolic HF: ICM, EF 0000000 s/p LVAD HM II implant 08/2013 for DT.   - Echo 3/18 EF 10-15% mild AI. Mod RV dysfunction. LVAD cannula OK - Now NYHA II in AFL. Consider RHC to further assess RV failure - Now off all diuretics including spiro - Failed Jardiance due to volume depletion - He has not tolerated ACE-I or amlodipine or b-blocker in the past.  3) LVAD: HMII 08/2013 for DT. - VAD interrogated personally. Parameters stable. - Admitted in May 2018 with elevated LDH with bival.  - Continue aspirin and coumadin.  - Hgb 12.9  - DL site ok - MAP ok  4) VT - now s/p ICD gen change.  - Off amio due to cerebellar side effects.  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 Now on sotalol.  - ICD interrogation today. No VT - Follows with  Dr. Curt Bears - Keep K> 4.0 Mg > 2.0  5) Chronic anticoagulation: INR goal 2.0-3.0.    - INR 3.3 Discussed dosing with PharmD personally. - Continue ASA 81 mg for LVAD + warfarin.   6) HTN:  - Blood pressure well controlled. Continue current regimen.   7) Hypothyroidism:  - Followed by Dr Dwyane Dee. Stable.No change   8) Hyperlipidemia:  - Continue Crestor. Zetia stopped due to cost.  - No change.   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 Liver u/s ok - labs ok today  Glori Bickers, MD  11:01 PM

## 2021-02-07 ENCOUNTER — Other Ambulatory Visit (HOSPITAL_COMMUNITY): Payer: Self-pay | Admitting: Unknown Physician Specialty

## 2021-02-07 DIAGNOSIS — Z95811 Presence of heart assist device: Secondary | ICD-10-CM

## 2021-02-07 DIAGNOSIS — Z7901 Long term (current) use of anticoagulants: Secondary | ICD-10-CM

## 2021-02-11 ENCOUNTER — Encounter (HOSPITAL_COMMUNITY): Payer: Medicare Other

## 2021-02-17 ENCOUNTER — Ambulatory Visit (HOSPITAL_COMMUNITY): Payer: Self-pay

## 2021-02-17 ENCOUNTER — Ambulatory Visit (HOSPITAL_COMMUNITY)
Admission: RE | Admit: 2021-02-17 | Discharge: 2021-02-17 | Disposition: A | Discharge status: Home / Self Care | Payer: Medicare Other | Source: Ambulatory Visit | Attending: Internal Medicine | Admitting: Internal Medicine

## 2021-02-17 ENCOUNTER — Other Ambulatory Visit: Payer: Self-pay

## 2021-02-17 DIAGNOSIS — Z95811 Presence of heart assist device: Secondary | ICD-10-CM

## 2021-02-17 DIAGNOSIS — I472 Ventricular tachycardia, unspecified: Secondary | ICD-10-CM

## 2021-02-17 DIAGNOSIS — Z7901 Long term (current) use of anticoagulants: Secondary | ICD-10-CM | POA: Diagnosis not present

## 2021-02-17 LAB — PROTIME-INR
INR: 3.6 — ABNORMAL HIGH (ref 0.8–1.2)
Prothrombin Time: 35.7 seconds — ABNORMAL HIGH (ref 11.4–15.2)

## 2021-02-17 LAB — LACTATE DEHYDROGENASE: LDH: 232 U/L — ABNORMAL HIGH (ref 98–192)

## 2021-02-17 MED ORDER — WARFARIN SODIUM 5 MG PO TABS
ORAL_TABLET | ORAL | 11 refills | Status: DC
Start: 2021-02-17 — End: 2021-03-20

## 2021-02-17 NOTE — Progress Notes (Signed)
LVAD INR 

## 2021-02-21 ENCOUNTER — Other Ambulatory Visit: Payer: Self-pay

## 2021-02-21 ENCOUNTER — Ambulatory Visit (INDEPENDENT_AMBULATORY_CARE_PROVIDER_SITE_OTHER): Payer: Medicare Other | Admitting: Student

## 2021-02-21 ENCOUNTER — Encounter: Payer: Self-pay | Admitting: Student

## 2021-02-21 VITALS — HR 87 | Ht 67.0 in | Wt 171.6 lb

## 2021-02-21 DIAGNOSIS — E876 Hypokalemia: Secondary | ICD-10-CM

## 2021-02-21 DIAGNOSIS — I472 Ventricular tachycardia, unspecified: Secondary | ICD-10-CM

## 2021-02-21 DIAGNOSIS — Z7901 Long term (current) use of anticoagulants: Secondary | ICD-10-CM

## 2021-02-21 DIAGNOSIS — I2510 Atherosclerotic heart disease of native coronary artery without angina pectoris: Secondary | ICD-10-CM | POA: Diagnosis not present

## 2021-02-21 DIAGNOSIS — I5022 Chronic systolic (congestive) heart failure: Secondary | ICD-10-CM | POA: Diagnosis not present

## 2021-02-21 DIAGNOSIS — Z95811 Presence of heart assist device: Secondary | ICD-10-CM | POA: Diagnosis not present

## 2021-02-21 LAB — CUP PACEART INCLINIC DEVICE CHECK
Battery Remaining Longevity: 93 mo
Battery Voltage: 3 V
Brady Statistic AP VP Percent: 0.19 %
Brady Statistic AP VS Percent: 39.26 %
Brady Statistic AS VP Percent: 1.12 %
Brady Statistic AS VS Percent: 59.43 %
Brady Statistic RA Percent Paced: 38.97 %
Brady Statistic RV Percent Paced: 1.34 %
Date Time Interrogation Session: 20220624105527
HighPow Impedance: 36 Ohm
HighPow Impedance: 46 Ohm
Implantable Lead Implant Date: 20091103
Implantable Lead Implant Date: 20091103
Implantable Lead Location: 753859
Implantable Lead Location: 753860
Implantable Lead Model: 5076
Implantable Lead Model: 6947
Implantable Pulse Generator Implant Date: 20201028
Lead Channel Impedance Value: 342 Ohm
Lead Channel Impedance Value: 399 Ohm
Lead Channel Impedance Value: 418 Ohm
Lead Channel Pacing Threshold Amplitude: 0.375 V
Lead Channel Pacing Threshold Amplitude: 1.375 V
Lead Channel Pacing Threshold Pulse Width: 0.4 ms
Lead Channel Pacing Threshold Pulse Width: 0.4 ms
Lead Channel Sensing Intrinsic Amplitude: 1.75 mV
Lead Channel Sensing Intrinsic Amplitude: 1.75 mV
Lead Channel Sensing Intrinsic Amplitude: 4.875 mV
Lead Channel Sensing Intrinsic Amplitude: 5.125 mV
Lead Channel Setting Pacing Amplitude: 1.5 V
Lead Channel Setting Pacing Amplitude: 2.75 V
Lead Channel Setting Pacing Pulse Width: 0.6 ms
Lead Channel Setting Sensing Sensitivity: 0.3 mV

## 2021-02-21 MED ORDER — POTASSIUM CHLORIDE CRYS ER 20 MEQ PO TBCR: 20.0000 meq | EXTENDED_RELEASE_TABLET | Freq: Every day | ORAL | 6 refills | Status: DC | PRN

## 2021-02-21 MED ORDER — FUROSEMIDE 20 MG PO TABS: 20.0000 mg | ORAL_TABLET | Freq: Every day | ORAL | 6 refills | Status: DC | PRN

## 2021-02-21 NOTE — Progress Notes (Signed)
Electrophysiology Office Note Date: 02/21/2021  ID:  Johnny Oneill, Johnny Oneill 23-Oct-1940, MRN [id number]  PCP: Lavone Orn, MD Primary Cardiologist: None Electrophysiologist: Will Meredith Leeds, MD  Primary HF: Dr. Haroldine Laws   CC: Routine ICD follow-up  Johnny Oneill is a 80 y.o. male seen today for Will Meredith Leeds, MD for routine electrophysiology followup.  Since last being seen in our clinic the patient reports doing overall very well.  He was seen in HF clinic earlier this month and noted to be in atrial flutter. Taken for cardioversion but was able to be paced out through his device. No recurrence.   He has h/o baker's cysts and feels like one ruptured in his Left leg. Had a sharp pain and several days of bruising, now resolved. Still with mild peripheral edema.   He denies chest pain, palpitations, dyspnea, PND, orthopnea, nausea, vomiting, dizziness, syncope, edema, weight gain, or early satiety. He has not had ICD shocks.   Device History: Medtronic Dual Chamber ICD implanted 2009, gen change 06/2019 History of appropriate therapy: Yes History of AAD therapy: Yes; previously stopped amiodarone due to cerebellar effects  Past Medical History:  Diagnosis Date   Anxiety    Automatic implantable cardioverter-defibrillator in situ    CAD (coronary artery disease)    S/P stenting of LCX in 2004;  s/p Lat MI 2009 with occlusion of the LCX - treated with Promus stenting. Has total occlusion of the RCA.    CHF (congestive heart failure) (HCC)    Hypercholesteremia    Hypertension    Hypothyroidism    ICD (implantable cardiac defibrillator) in place    PROPHYLACTIC      medtronic   Inferior MI ([place]) "? date"; 2009   Ischemic cardiomyopathy    a. 10/09 Echo: Sev LV Dysfxn, inf/lat AK, Mod MR.   Liver disease    LVAD (left ventricular assist device) present ([place])    Osteoarthritis    a. s/p R TKR   Pacemaker    PVC (premature ventricular contraction)    RBBB     Shortness of breath    Past Surgical History:  Procedure Laterality Date   CARPAL TUNNEL RELEASE Right 05/29/2016   Procedure: CARPAL TUNNEL RELEASE;  Surgeon: Earlie Server, MD;  Location: [place];  Service: Orthopedics;  Laterality: Right;   CORONARY ANGIOPLASTY WITH STENT PLACEMENT  2004   CORONARY ANGIOPLASTY WITH STENT PLACEMENT  07/2008   DEFIBRILLATOR  2009   ESOPHAGOGASTRODUODENOSCOPY N/A 09/15/2013   Procedure: ESOPHAGOGASTRODUODENOSCOPY (EGD);  Surgeon: Ladene Artist, MD;  Location: [facility] ENDOSCOPY;  Service: Endoscopy;  Laterality: N/A;  bedside   HEMATOMA EVACUATION Right 11/06/2013   Procedure: EVACUATION HEMATOMA;  Surgeon: Gaye Pollack, MD;  Location: [place];  Service: Cardiothoracic;  Laterality: Right;   ICD GENERATOR CHANGEOUT N/A 06/28/2019   Procedure: ICD GENERATOR CHANGEOUT;  Surgeon: Constance Haw, MD;  Location: [place] CV LAB;  Service: Cardiovascular;  Laterality: N/A;   INSERT / REPLACE / REMOVE PACEMAKER  2009   INSERTION OF IMPLANTABLE LEFT VENTRICULAR ASSIST DEVICE N/A 09/18/2013   Procedure: INSERTION OF IMPLANTABLE LEFT VENTRICULAR ASSIST DEVICE;  Surgeon: Ivin Poot, MD;  Location: [place];  Service: Open Heart Surgery;  Laterality: N/A;  CIRC ARREST  NITRIC OXIDE   INTRA-AORTIC BALLOON PUMP INSERTION N/A 09/14/2013   Procedure: INTRA-AORTIC BALLOON PUMP INSERTION;  Surgeon: Jolaine Artist, MD;  Location: [facility] CATH LAB;  Service: Cardiovascular;  Laterality: N/A;   INTRAOPERATIVE TRANSESOPHAGEAL ECHOCARDIOGRAM  N/A 09/18/2013   Procedure: INTRAOPERATIVE TRANSESOPHAGEAL ECHOCARDIOGRAM;  Surgeon: Ivin Poot, MD;  Location: [place];  Service: Open Heart Surgery;  Laterality: N/A;   KNEE ARTHROSCOPY     bilaterally   KNEE ARTHROSCOPY W/ PARTIAL MEDIAL MENISCECTOMY  12/2002   left   LEFT VENTRICULAR ASSIST DEVICE     RIGHT HEART CATHETERIZATION N/A 08/25/2013   Procedure: RIGHT HEART CATH;  Surgeon: Jolaine Artist, MD;  Location: [facility] CATH LAB;   Service: Cardiovascular;  Laterality: N/A;   RIGHT HEART CATHETERIZATION N/A 09/13/2013   Procedure: RIGHT HEART CATH;  Surgeon: Jolaine Artist, MD;  Location: [facility] CATH LAB;  Service: Cardiovascular;  Laterality: N/A;   RIGHT HEART CATHETERIZATION N/A 02/08/2014   Procedure: RIGHT HEART CATH;  Surgeon: Jolaine Artist, MD;  Location: [facility] CATH LAB;  Service: Cardiovascular;  Laterality: N/A;   RIGHT HEART CATHETERIZATION N/A 02/12/2014   Procedure: RIGHT HEART CATH;  Surgeon: Jolaine Artist, MD;  Location: [facility] CATH LAB;  Service: Cardiovascular;  Laterality: N/A;   TOTAL KNEE ARTHROPLASTY  11/27/11   left   TOTAL KNEE ARTHROPLASTY  11/27/2011   Procedure: TOTAL KNEE ARTHROPLASTY;  Surgeon: Yvette Rack., MD;  Location: [place];  Service: Orthopedics;  Laterality: Left;  left total knee arthroplasty   VIDEO ASSISTED THORACOSCOPY Right 11/06/2013   Procedure: VIDEO ASSISTED THORACOSCOPY;  Surgeon: Gaye Pollack, MD;  Location: [facility] OR;  Service: Cardiothoracic;  Laterality: Right;    Current Outpatient Medications  Medication Sig Dispense Refill   acetaminophen (TYLENOL) 500 MG tablet Take 500-1,000 mg by mouth every 6 (six) hours as needed (pain).     aspirin EC 81 MG tablet Take 81 mg by mouth in the morning.     cholecalciferol (VITAMIN D3) 25 MCG (1000 UT) tablet Take 1,000 Units by mouth in the morning.     citalopram (CELEXA) 10 MG tablet Take 1 tablet by mouth once daily 90 tablet 3   Coenzyme Q10 (COQ10) 200 MG CAPS Take 200 mg by mouth in the morning.     diphenhydrAMINE (SOMINEX) 25 MG tablet Take 25 mg by mouth at bedtime as needed for sleep.     famotidine (PEPCID) 20 MG tablet Take 20 mg by mouth in the morning.     hydrALAZINE (APRESOLINE) 50 MG tablet Take 0.5 tablets (25 mg total) by mouth 3 (three) times daily. 270 tablet 3   ibuprofen (ADVIL) 200 MG tablet Take 200 mg by mouth daily as needed (muscle strain/pain.).     levothyroxine (EUTHYROX) 137 MCG tablet Take 1 tablet  (137 mcg total) by mouth daily before breakfast. 90 tablet 1   pantoprazole (PROTONIX) 40 MG tablet Take 1 tablet (40 mg total) by mouth daily. 90 tablet 3   Probiotic Product (PROBIOTIC PO) Take 1 capsule by mouth in the morning.     rosuvastatin (CRESTOR) 20 MG tablet Take 1 tablet (20 mg total) by mouth daily. 90 tablet 3   sotalol (BETAPACE) 80 MG tablet TAKE 1 TABLET (80 MG TOTAL) BY MOUTH DAILY. 90 tablet 3   traZODone (DESYREL) 50 MG tablet Take 1 tablet (50 mg total) by mouth at bedtime. 30 tablet 5   warfarin (COUMADIN) 5 MG tablet Take 5 mg (1 tablet) every Monday and 7.5 mg (1.5 tablets) all other days OR as directed by HF Clinic 135 tablet 11   furosemide (LASIX) 20 MG tablet Take 1 tablet (20 mg total) by mouth daily as needed (Take 20 mg  as daily as needed for wt > 153 lbs). Reported on 01/14/2016 30 tablet 6   potassium chloride SA (KLOR-CON) 20 MEQ tablet Take 1 tablet (20 mEq total) by mouth daily as needed. Take one potassium pill when you take your lasix. 30 tablet 6   No current facility-administered medications for this visit.   Facility-Administered Medications Ordered in Other Visits  Medication Dose Route Frequency Provider Last Rate Last Admin   etomidate (AMIDATE) injection    Anesthesia Intra-op Myna Bright, CRNA   20 mg at 02/08/14 0915   rocuronium [facility]) injection    Anesthesia Intra-op Myna Bright, CRNA   50 mg at 02/08/14 0932   succinylcholine (ANECTINE) injection    Anesthesia Intra-op Myna Bright, CRNA   100 mg at 02/08/14 0915    Allergies:   Ace inhibitors, Amiodarone, Diovan [valsartan], Lipitor [atorvastatin calcium], Jardiance [empagliflozin], and Norvasc [amlodipine]   Social History: Social History   Socioeconomic History   Marital status: Married    Spouse name: Not on file   Number of children: 1   Years of education: Not on file   Highest education level: Not on file  Occupational History   Occupation: Science writer: RETIRED   Occupation: Copywriter, advertising  Tobacco Use   Smoking status: Never   Smokeless tobacco: Never  Vaping Use   Vaping Use: Never used  Substance and Sexual Activity   Alcohol use: No   Drug use: No    Types: Marijuana    Comment: "back in our younger days we smoked a little pot"   Sexual activity: Not Currently  Other Topics Concern   Not on file  Social History Narrative   Not on file   Social Determinants of Health   Financial Resource Strain: Not on file  Food Insecurity: Not on file  Transportation Needs: Not on file  Physical Activity: Not on file  Stress: Not on file  Social Connections: Not on file  Intimate Partner Violence: Not on file    Family History: Family History  Problem Relation Age of Onset   Heart failure Father    Stroke Father    Coronary artery disease Mother    Heart disease Mother    Hyperlipidemia Sister     Review of Systems: All other systems reviewed and are otherwise negative except as noted above.   Physical Exam: Vitals:   02/21/21 1021  Pulse: 87  SpO2: 98%  Weight: 171 lb 9.6 oz (77.8 kg)  Height: '5\' 7"'$  (1.702 m)     GEN- The patient is well appearing, alert and oriented x 3 today.   HEENT: normocephalic, atraumatic; sclera clear, conjunctiva pink; hearing intact; oropharynx clear; neck supple, no JVP Lymph- no cervical lymphadenopathy Lungs- Clear to ausculation bilaterally, normal work of breathing.  No wheezes, rales, rhonchi Heart- Regular rate and rhythm, no murmurs, rubs or gallops, PMI not laterally displaced GI- soft, non-tender, non-distended, bowel sounds present, no hepatosplenomegaly Extremities- no clubbing or cyanosis. No edema; DP/PT/radial pulses 2+ bilaterally MS- no significant deformity or atrophy Skin- warm and dry, no rash or lesion; ICD pocket well healed Psych- euthymic mood, full affect Neuro- strength and sensation are intact  ICD interrogation- reviewed in detail today,  See  PACEART report  EKG:  EKG is not ordered today.  Recent Labs: 12/01/2020: ALT 13; B Natriuretic Peptide 409.8 12/24/2020: TSH 5.93 12/31/2020: Magnesium 1.9; Platelets 154 02/06/2021: BUN 25; Creatinine, Ser 2.00; Hemoglobin 12.9; Potassium 4.4;  Sodium 140   Wt Readings from Last 3 Encounters:  02/21/21 171 lb 9.6 oz (77.8 kg)  02/06/21 166 lb (75.3 kg)  12/31/20 172 lb 9.6 oz (78.3 kg)     Other studies Reviewed: Additional studies/ records that were reviewed today include:  Previous EP and HF clinic notes   Assessment and Plan:  1.  Chronic systolic dysfunction s/p Medtronic dual chamber ICD  euvolemic today Stable on an appropriate medical regimen Normal ICD function See [facility] report No changes today LVAD HM2 managed by CHF team He uses lasix as needed only and hasn't required in some time. He asked for an updated prescription just in case. Lasix at home outdated. Patient knows not to make significant changes to his diuretic regimen without involving VAD care team.   2. VT Quiescent  3. Atrial flutter Recent breakthrough. Scheduled for [facility] but was paced out be device.  Managed chronically on coumadin in setting of VAD.   Current medicines are reviewed at length with the patient today.   The patient does not have concerns regarding his medicines.  The following changes were made today:  none  Labs/ tests ordered today include:  Orders Placed This Encounter  Procedures   CUP [place]     Disposition:   Follow up with Dr. Curt Bears  6 months   Signed, Shirley Friar, PA-C  02/21/2021 11:59 AM  [facility] [address] [place] [address] [phone] (office) [phone] (fax)

## 2021-02-25 ENCOUNTER — Telehealth (HOSPITAL_COMMUNITY): Payer: Self-pay | Admitting: Licensed Clinical Social Worker

## 2021-02-25 NOTE — Telephone Encounter (Signed)
Patient attended LVAD Support Group today. Jackie Geral Coker, LCSW, CCSW-MCS [phone] 

## 2021-02-27 ENCOUNTER — Other Ambulatory Visit (HOSPITAL_COMMUNITY): Payer: Self-pay | Admitting: *Deleted

## 2021-02-27 DIAGNOSIS — E876 Hypokalemia: Secondary | ICD-10-CM

## 2021-02-27 DIAGNOSIS — Z7901 Long term (current) use of anticoagulants: Secondary | ICD-10-CM

## 2021-02-27 DIAGNOSIS — Z95811 Presence of heart assist device: Secondary | ICD-10-CM

## 2021-03-04 ENCOUNTER — Encounter (HOSPITAL_COMMUNITY): Payer: 59

## 2021-03-04 ENCOUNTER — Other Ambulatory Visit: Payer: Self-pay

## 2021-03-04 ENCOUNTER — Ambulatory Visit (HOSPITAL_COMMUNITY)
Admission: RE | Admit: 2021-03-04 | Discharge: 2021-03-04 | Disposition: A | Discharge status: Home / Self Care | Payer: Medicare Other | Source: Ambulatory Visit | Attending: Cardiology | Admitting: Cardiology

## 2021-03-04 ENCOUNTER — Ambulatory Visit (HOSPITAL_COMMUNITY): Payer: Self-pay

## 2021-03-04 DIAGNOSIS — Z7901 Long term (current) use of anticoagulants: Secondary | ICD-10-CM | POA: Diagnosis not present

## 2021-03-04 DIAGNOSIS — Z95811 Presence of heart assist device: Secondary | ICD-10-CM | POA: Insufficient documentation

## 2021-03-04 LAB — PROTIME-INR
INR: 3 — ABNORMAL HIGH (ref 0.8–1.2)
Prothrombin Time: 31.2 seconds — ABNORMAL HIGH (ref 11.4–15.2)

## 2021-03-04 NOTE — Progress Notes (Signed)
LVAD INR 

## 2021-03-17 ENCOUNTER — Other Ambulatory Visit (HOSPITAL_COMMUNITY): Payer: Self-pay | Admitting: Internal Medicine

## 2021-03-17 DIAGNOSIS — I255 Ischemic cardiomyopathy: Secondary | ICD-10-CM

## 2021-03-18 ENCOUNTER — Other Ambulatory Visit (HOSPITAL_COMMUNITY): Payer: Self-pay | Admitting: *Deleted

## 2021-03-18 ENCOUNTER — Telehealth (HOSPITAL_COMMUNITY): Payer: Self-pay | Admitting: Licensed Clinical Social Worker

## 2021-03-18 DIAGNOSIS — R6889 Other general symptoms and signs: Secondary | ICD-10-CM

## 2021-03-18 DIAGNOSIS — Z95811 Presence of heart assist device: Secondary | ICD-10-CM

## 2021-03-18 DIAGNOSIS — D508 Other iron deficiency anemias: Secondary | ICD-10-CM

## 2021-03-18 DIAGNOSIS — Z7901 Long term (current) use of anticoagulants: Secondary | ICD-10-CM

## 2021-03-18 NOTE — Telephone Encounter (Signed)
CSW contacted patient to follow up on [facility] email and inquire about interest in attending. Patient plans to attend. Raquel Sarna, [place], [place]

## 2021-03-20 ENCOUNTER — Ambulatory Visit (HOSPITAL_COMMUNITY): Payer: Self-pay

## 2021-03-20 ENCOUNTER — Ambulatory Visit (HOSPITAL_COMMUNITY)
Admission: RE | Admit: 2021-03-20 | Discharge: 2021-03-20 | Disposition: A | Discharge status: Home / Self Care | Payer: Medicare Other | Source: Ambulatory Visit | Attending: Cardiology | Admitting: Cardiology

## 2021-03-20 ENCOUNTER — Other Ambulatory Visit: Payer: Self-pay

## 2021-03-20 VITALS — BP 99/73 | HR 76 | Temp 98.1°F | Ht 69.0 in | Wt 171.4 lb

## 2021-03-20 DIAGNOSIS — D508 Other iron deficiency anemias: Secondary | ICD-10-CM

## 2021-03-20 DIAGNOSIS — Z79899 Other long term (current) drug therapy: Secondary | ICD-10-CM | POA: Diagnosis not present

## 2021-03-20 DIAGNOSIS — R42 Dizziness and giddiness: Secondary | ICD-10-CM | POA: Insufficient documentation

## 2021-03-20 DIAGNOSIS — N1832 Chronic kidney disease, stage 3b: Secondary | ICD-10-CM | POA: Diagnosis not present

## 2021-03-20 DIAGNOSIS — Z7982 Long term (current) use of aspirin: Secondary | ICD-10-CM | POA: Insufficient documentation

## 2021-03-20 DIAGNOSIS — I480 Paroxysmal atrial fibrillation: Secondary | ICD-10-CM

## 2021-03-20 DIAGNOSIS — R7402 Elevation of levels of lactic acid dehydrogenase (LDH): Secondary | ICD-10-CM | POA: Insufficient documentation

## 2021-03-20 DIAGNOSIS — I255 Ischemic cardiomyopathy: Secondary | ICD-10-CM | POA: Insufficient documentation

## 2021-03-20 DIAGNOSIS — I472 Ventricular tachycardia, unspecified: Secondary | ICD-10-CM

## 2021-03-20 DIAGNOSIS — Z7901 Long term (current) use of anticoagulants: Secondary | ICD-10-CM

## 2021-03-20 DIAGNOSIS — Z95811 Presence of heart assist device: Secondary | ICD-10-CM | POA: Diagnosis not present

## 2021-03-20 DIAGNOSIS — I4892 Unspecified atrial flutter: Secondary | ICD-10-CM | POA: Diagnosis not present

## 2021-03-20 DIAGNOSIS — I130 Hypertensive heart and chronic kidney disease with heart failure and stage 1 through stage 4 chronic kidney disease, or unspecified chronic kidney disease: Secondary | ICD-10-CM | POA: Insufficient documentation

## 2021-03-20 DIAGNOSIS — N183 Chronic kidney disease, stage 3 unspecified: Secondary | ICD-10-CM | POA: Diagnosis not present

## 2021-03-20 DIAGNOSIS — I10 Essential (primary) hypertension: Secondary | ICD-10-CM | POA: Diagnosis not present

## 2021-03-20 DIAGNOSIS — I2510 Atherosclerotic heart disease of native coronary artery without angina pectoris: Secondary | ICD-10-CM | POA: Insufficient documentation

## 2021-03-20 DIAGNOSIS — R6889 Other general symptoms and signs: Secondary | ICD-10-CM

## 2021-03-20 DIAGNOSIS — Z95810 Presence of automatic (implantable) cardiac defibrillator: Secondary | ICD-10-CM | POA: Insufficient documentation

## 2021-03-20 DIAGNOSIS — R748 Abnormal levels of other serum enzymes: Secondary | ICD-10-CM | POA: Diagnosis not present

## 2021-03-20 DIAGNOSIS — I5022 Chronic systolic (congestive) heart failure: Secondary | ICD-10-CM | POA: Insufficient documentation

## 2021-03-20 DIAGNOSIS — E039 Hypothyroidism, unspecified: Secondary | ICD-10-CM | POA: Diagnosis not present

## 2021-03-20 LAB — COMPREHENSIVE METABOLIC PANEL
ALT: 18 U/L (ref 0–44)
AST: 22 U/L (ref 15–41)
Albumin: 4 g/dL (ref 3.5–5.0)
Alkaline Phosphatase: 79 U/L (ref 38–126)
Anion gap: 7 (ref 5–15)
BUN: 27 mg/dL — ABNORMAL HIGH (ref 8–23)
CO2: 24 mmol/L (ref 22–32)
Calcium: 9.4 mg/dL (ref 8.9–10.3)
Chloride: 106 mmol/L (ref 98–111)
Creatinine, Ser: 2.01 mg/dL — ABNORMAL HIGH (ref 0.61–1.24)
GFR, Estimated: 34 mL/min — ABNORMAL LOW (ref >60)
Glucose, Bld: 144 mg/dL — ABNORMAL HIGH (ref 70–99)
Potassium: 4.3 mmol/L (ref 3.5–5.1)
Sodium: 137 mmol/L (ref 135–145)
Total Bilirubin: 0.7 mg/dL (ref 0.3–1.2)
Total Protein: 7.3 g/dL (ref 6.5–8.1)

## 2021-03-20 LAB — CBC
HCT: 39.3 % (ref 39.0–52.0)
Hemoglobin: 12.5 g/dL — ABNORMAL LOW (ref 13.0–17.0)
MCH: 29.5 pg (ref 26.0–34.0)
MCHC: 31.8 g/dL (ref 30.0–36.0)
MCV: 92.7 fL (ref 80.0–100.0)
Platelets: 170 10*3/uL (ref 150–400)
RBC: 4.24 MIL/uL (ref 4.22–5.81)
RDW: 15.1 % (ref 11.5–15.5)
WBC: 6 10*3/uL (ref 4.0–10.5)
nRBC: 0 % (ref 0.0–0.2)

## 2021-03-20 LAB — PROTIME-INR
INR: 3.4 — ABNORMAL HIGH (ref 0.8–1.2)
Prothrombin Time: 34.4 seconds — ABNORMAL HIGH (ref 11.4–15.2)

## 2021-03-20 LAB — MAGNESIUM: Magnesium: 2.1 mg/dL (ref 1.7–2.4)

## 2021-03-20 LAB — LACTATE DEHYDROGENASE: LDH: 241 U/L — ABNORMAL HIGH (ref 98–192)

## 2021-03-20 MED ORDER — WARFARIN SODIUM 5 MG PO TABS: ORAL_TABLET | ORAL | 11 refills | Status: DC

## 2021-03-20 NOTE — Progress Notes (Signed)
LVAD INR 

## 2021-03-20 NOTE — Progress Notes (Signed)
Patient presents for 2 mo f/u and 7.5 year Intermacs f/u in [facility] today alone. Reports no problems with VAD equipment or concerns with drive line.   Pt states that he continues to have dizzy spells when getting up too quickly and bending over while working at [employer]. Orthostatics obtained - see below. He admits he is not drinking 2 liters fluid per day. Pt states that he has not taken any Lasix since he was last seen in clinic. Hydaralazine dc'd per Dr. Haroldine Laws.   Patient remains on Sotalol, EKG obtained and reviewed by Dr. Haroldine Laws.  Increased # of PI events; bedside Medtronic ICD interrogation performed and reviewed by Dr. Haroldine Laws.   Patient had questions about changing system controller - see below.   Vital Signs:   Temp: 98.1 Doppler Pressure: 92 Automatc BP: 99/73 (83) HR: 72 SPO2: 97% on RA   Weight: 171.4 lb w/ eqt Last weight: 172.6 lb  VAD Indication: Destination therapy- patient choice     VAD interrogation & Equipment Management: Speed: 9400 Flow: 4.7 Power: 5.5w    PI: 6.4   Alarms: none Events: 30 - 70 PI events daily  Fixed speed 9400  Low speed limit: 8800   Primary Controller:  Replace back up battery in 19 months Back up controller:  Replace back up battery in 29 months -- did not bring today   Annual Equipment Maintenance on UBC/PM was performed on 10/2020.   I reviewed the LVAD parameters from today and compared the results to the patient's prior recorded data. LVAD interrogation was NEGATIVE for significant power changes, NEGATIVE for clinical alarms and STABLE for PI events/speed drops. No programming changes were made and pump is functioning within specified parameters. Pt is performing daily controller and system monitor self tests along with completing weekly and monthly maintenance for LVAD equipment.  Exit Site Care: Drive line is being maintained weekly by [name], his wife. Dressing dry and intact. Stabilization device present and  accurately applied. Pt denies fever or chills. Pt has CHG allergy. Provided patient with 8 weekly dressing kits for home use.    Significant Events on VAD Support:  10/2013>SBO 12/2016> elevated LDH, admit for [place]   Device: Dual Medtronic Therapies:  V therapy on at 222 bpm; V monitor on 171         A therapy on 171 bpm (ramp and burst overdrive paing); A monitor on 300 bpm  Pacing: AAI<=>DDD 60 bpm  Last check: 02/21/21  BP & Labs:  Doppler 92 - Doppler is reflecting modified systolic   Hgb [id number] - No S/S of bleeding. Specifically denies melena/BRBPR or nosebleeds.   LDH 241 - established baseline of 200- 350. Denies tea-colored urine. No power elevations noted on interrogation.   7.5 year Intermacs follow up completed including:  Quality of Life, KCCQ-12, and Neurocognitive trail making.   Pt unable to perform 6 minute walk due to knee issues.  [facility] Cardiomyopathy Questionnaire  KCCQ-12 03/20/2021 10/16/2020 02/13/2020  1 a. Ability to shower/bathe Slightly limited Not at all limited Slightly limited  1 b. Ability to walk 1 block Slightly limited Slightly limited Not at all limited  1 c. Ability to hurry/jog Other, Did not do Moderately limited Slightly limited  2. Edema feet/ankles/legs Never over the past 2 weeks Never over the past 2 weeks Less than once a week  3. Limited by fatigue Less than once a week Never over the past 2 weeks Never over the past 2 weeks  4.  Limited by dyspnea Less than once a week At least once a day Never over the past 2 weeks  5. Sitting up / on 3+ pillows Never over the past 2 weeks Never over the past 2 weeks Never over the past 2 weeks  6. Limited enjoyment of life Not limited at all Not limited at all Not limited at all  7. Rest of life w/ symptoms Completely satisfied Completely satisfied Completely satisfied  8 a. Participation in hobbies Slightly limited Slightly limited Slightly limited  8 b. Participation in chores Did not limit at all Did  not limit at all Did not limit at all  8 c. Visiting family/friends Did not limit at all Did not limit at all Did not limit at all    Back up controller: pt did not bring today.    Patient Instructions:  Stop Hydralazine. BP check with next INR lab visit. Return to [facility] in 2 months.  Zada Girt RN [place] Coordinator  Office: [phone]  24/7 Pager: [phone]

## 2021-03-20 NOTE — Patient Instructions (Signed)
Stop Hydralazine BP check with next INR lab visit. Return to [facility] in 2 months.

## 2021-03-21 NOTE — Progress Notes (Signed)
VAD CLINIC NOTE   HPI:  Johnny Oneill is a 80 y.o. 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    LVAD Issues  Elevated LDH--bivalirudin. D/C LDH 188  Presented to ER on 06/27/19 with syncopal episode and head injury from fall. ECG showed VT @ 240. Underwent emergent DC-CV in ER. Head CT with no bleed. Was admitted and had ICD gen replacement (EOL). Had small hematoma at site. Amio started. Post-discharge found to have episode of AFL on ICD remote check but has since resolved.    Had ICD shock for VT on 06/09/20. Started on sotalol   On 02/06/21 Paced out of AFL.  He presents today for routine f/u. Doing well. He is now 7.5 years out on support. Continues to work in [employer] and be active but continues to have dizzy spells when getting up too quickly and bending over while working. Says he is not drinking enough fluids. No longer taking lasix. No edema, orthopnea or PND. No ICD shocks. Denies orthopnea or PND. No fevers, chills or problems with driveline. No bleeding, melena or neuro symptoms. No VAD alarms. Taking all meds as prescribed.     VAD Indication: Destination therapy- patient choice     VAD interrogation & Equipment Management: Speed: 9400 Flow: 4.7 Power: 5.5w    PI: 6.4   Alarms: none Events: 30 - 70 PI events daily  Fixed speed 9400 Low speed limit: 8800   Primary Controller:  Replace back up battery in 19 months Back up controller:  Replace back up battery in 29 months -- did not bring today   Annual Equipment Maintenance on UBC/PM was performed on 10/2020.   I reviewed the LVAD parameters from today and compared the results to the patient's prior recorded data. LVAD interrogation was NEGATIVE for significant power changes, NEGATIVE for clinical alarms and STABLE for PI events/speed drops. No programming changes were made and pump is functioning within specified  parameters. Pt is performing daily controller and system monitor self tests along with completing weekly and monthly maintenance for LVAD equipment.      Past Medical History:  Diagnosis Date   Anxiety    Automatic implantable cardioverter-defibrillator in situ    CAD (coronary artery disease)    S/P stenting of LCX in 2004;  s/p Lat MI 2009 with occlusion of the LCX - treated with Promus stenting. Has total occlusion of the RCA.    CHF (congestive heart failure) (HCC)    Hypercholesteremia    Hypertension    Hypothyroidism    ICD (implantable cardiac defibrillator) in place    PROPHYLACTIC      medtronic   Inferior MI ([place]) "? date"; 2009   Ischemic cardiomyopathy    a. 10/09 Echo: Sev LV Dysfxn, inf/lat AK, Mod MR.   Liver disease    LVAD (left ventricular assist device) present ([place])    Osteoarthritis    a. s/p R TKR   Pacemaker    PVC (premature ventricular contraction)    RBBB    Shortness of breath     Current Outpatient Medications  Medication Sig Dispense Refill   acetaminophen (TYLENOL) 500 MG tablet Take 500-1,000 mg by mouth every 6 (six) hours as needed (pain).     aspirin EC 81 MG tablet Take 81 mg by mouth in the morning.     cholecalciferol (VITAMIN D3) 25 MCG (1000 UT)  tablet Take 1,000 Units by mouth in the morning.     citalopram (CELEXA) 10 MG tablet Take 1 tablet by mouth once daily 90 tablet 3   Coenzyme Q10 (COQ10) 200 MG CAPS Take 200 mg by mouth in the morning.     diphenhydrAMINE (SOMINEX) 25 MG tablet Take 25 mg by mouth at bedtime as needed for sleep.     famotidine (PEPCID) 20 MG tablet Take 20 mg by mouth in the morning.     levothyroxine (EUTHYROX) 137 MCG tablet Take 1 tablet (137 mcg total) by mouth daily before breakfast. 90 tablet 1   pantoprazole (PROTONIX) 40 MG tablet Take 1 tablet (40 mg total) by mouth daily. 90 tablet 3   Probiotic Product (PROBIOTIC PO) Take 1 capsule by mouth in the morning.     rosuvastatin (CRESTOR) 20 MG tablet  Take 1 tablet (20 mg total) by mouth daily. 90 tablet 3   sotalol (BETAPACE) 80 MG tablet TAKE 1 TABLET (80 MG TOTAL) BY MOUTH DAILY. 90 tablet 3   traZODone (DESYREL) 50 MG tablet Take 1 tablet (50 mg total) by mouth at bedtime. 30 tablet 5   furosemide (LASIX) 20 MG tablet Take 1 tablet (20 mg total) by mouth daily as needed (Take 20 mg as daily as needed for wt > 153 lbs). Reported on 01/14/2016 (Patient not taking: Reported on 03/20/2021) 30 tablet 6   potassium chloride SA (KLOR-CON) 20 MEQ tablet Take 1 tablet (20 mEq total) by mouth daily as needed. Take one potassium pill when you take your lasix. (Patient not taking: Reported on 03/20/2021) 30 tablet 6   warfarin (COUMADIN) 5 MG tablet Take 5 mg (1 tablet) every Monday/Friday and 7.5 mg (1.5 tablets) all other days OR as directed by HF Clinic 135 tablet 11   No current facility-administered medications for this encounter.   Facility-Administered Medications Ordered in Other Encounters  Medication Dose Route Frequency Provider Last Rate Last Admin   etomidate (AMIDATE) injection    Anesthesia Intra-op Myna Bright, CRNA   20 mg at 02/08/14 0915   rocuronium [facility]) injection    Anesthesia Intra-op Myna Bright, CRNA   50 mg at 02/08/14 0932   succinylcholine (ANECTINE) injection    Anesthesia Intra-op Myna Bright, CRNA   100 mg at 02/08/14 0915   Ace inhibitors, Amiodarone, Diovan [valsartan], Lipitor [atorvastatin calcium], Jardiance [empagliflozin], and Norvasc [amlodipine]  Review of systems complete and found to be negative unless listed in HPI.      Vital Signs:  Doppler Pressure: 84 Vitals:   03/20/21 1056 03/20/21 1057  BP: (!) 92/0 99/73  Pulse:  76  Temp:  98.1 F (36.7 C)  SpO2:  97%  SPO2: 98% on room air.  RA      Vital Signs:   Temp: 98.1 Doppler Pressure: 92 Automatc BP: 99/73 (83) HR: 72 SPO2: 97% on RA   Weight: 171.4 lb w/ eqt Last weight: 172.6 lb    Physical Exam: General:   NAD.  HEENT: normal  Neck: supple. JVP not elevated.  Carotids 2+ bilat; no bruits. No lymphadenopathy or thryomegaly appreciated. Cor: LVAD hum.  Lungs: Clear. Abdomen soft, nontender, non-distended. No hepatosplenomegaly. No bruits or masses. Good bowel sounds. Driveline site clean. Anchor in place.  Extremities: no cyanosis, clubbing, rash. Warm no edema  Neuro: alert & oriented x 3. No focal deficits. Moves all 4 without problem     ASSESSMENT AND PLAN:   1) Chronic systolic HF:  ICM, EF 20-25% s/p LVAD HM II implant 08/2013 for DT.   - Echo 3/18 EF 10-15% mild AI. Mod RV dysfunction. LVAD cannula OK - has significant RV failure - Stable NYHA II-III. Volume status looks good today. - Now off all diuretics including spiro - Failed Jardiance due to volume depletion - He has not tolerated ACE-I or amlodipine or b-blocker in the past.  2) LVAD: HMII 08/2013 for DT. - VAD interrogated personally. Parameters stable. - Admitted in May 2018 with elevated LDH with bival.  - Continue aspirin and coumadin.  - Hgb stable at 12.5 - DL site ok - MAP ok but continues with some orthostasis. Stop hydralazine  3) VT - now s/p ICD gen change.  - Off amio due to cerebellar side effects.  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 Now on sotalol. QTc ok on ECG.  - ICD interrogated today personally. No VT.  - Follows with Dr. Curt Bears - Keep K> 4.0 Mg > 2.0  4) Atrial flutter, recurrent - patient paced out of AFL in 6/22. Atrial therapies enabled  - continue sotalol (on for VT) - Continue warfarin INR 3.4 - may need to consider ablation if recurs  5) Chronic anticoagulation: INR goal 2.0-3.0.    - INR 3.4 Discussed dosing with PharmD personally. - Continue ASA 81 mg for LVAD + warfarin.   6) HTN:  - MAP ok but continues with some orthostasis. Stop hydralazine   7) Hypothyroidism:  - Followed by Dr Dwyane Dee. Stable.No change   8) Hyperlipidemia:  - Continue Crestor. Zetia stopped due to cost.  - No change.    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 Liver u/s ok - labs ok today  Total time spent 35 minutes. Over half that time spent discussing above.   Glori Bickers, MD  10:42 PM

## 2021-03-27 ENCOUNTER — Ambulatory Visit (INDEPENDENT_AMBULATORY_CARE_PROVIDER_SITE_OTHER): Payer: Medicare Other

## 2021-03-27 DIAGNOSIS — I255 Ischemic cardiomyopathy: Secondary | ICD-10-CM

## 2021-03-28 ENCOUNTER — Other Ambulatory Visit (INDEPENDENT_AMBULATORY_CARE_PROVIDER_SITE_OTHER): Payer: Medicare Other

## 2021-03-28 ENCOUNTER — Other Ambulatory Visit: Payer: Self-pay

## 2021-03-28 DIAGNOSIS — E890 Postprocedural hypothyroidism: Secondary | ICD-10-CM

## 2021-03-28 LAB — CUP PACEART REMOTE DEVICE CHECK
Battery Remaining Longevity: 91 mo
Battery Voltage: 3 V
Brady Statistic AP VP Percent: 1.14 %
Brady Statistic AP VS Percent: 57.68 %
Brady Statistic AS VP Percent: 4.35 %
Brady Statistic AS VS Percent: 36.84 %
Brady Statistic RA Percent Paced: 56.7 %
Brady Statistic RV Percent Paced: 5.34 %
Date Time Interrogation Session: 20220728033524
HighPow Impedance: 37 Ohm
HighPow Impedance: 45 Ohm
Implantable Lead Implant Date: 20091103
Implantable Lead Implant Date: 20091103
Implantable Lead Location: 753859
Implantable Lead Location: 753860
Implantable Lead Model: 5076
Implantable Lead Model: 6947
Implantable Pulse Generator Implant Date: 20201028
Lead Channel Impedance Value: 304 Ohm
Lead Channel Impedance Value: 361 Ohm
Lead Channel Impedance Value: 399 Ohm
Lead Channel Pacing Threshold Amplitude: 0.5 V
Lead Channel Pacing Threshold Amplitude: 1.375 V
Lead Channel Pacing Threshold Pulse Width: 0.4 ms
Lead Channel Pacing Threshold Pulse Width: 0.4 ms
Lead Channel Sensing Intrinsic Amplitude: 1.5 mV
Lead Channel Sensing Intrinsic Amplitude: 1.5 mV
Lead Channel Sensing Intrinsic Amplitude: 5.375 mV
Lead Channel Sensing Intrinsic Amplitude: 5.375 mV
Lead Channel Setting Pacing Amplitude: 1.5 V
Lead Channel Setting Pacing Amplitude: 2.75 V
Lead Channel Setting Pacing Pulse Width: 0.6 ms
Lead Channel Setting Sensing Sensitivity: 0.3 mV

## 2021-03-28 LAB — T4, FREE: Free T4: 1.14 ng/dL (ref 0.60–1.60)

## 2021-03-28 LAB — TSH: TSH: 0.98 u[IU]/mL (ref 0.35–5.50)

## 2021-03-31 NOTE — Progress Notes (Signed)
Patient ID: Johnny Oneill, male   DOB: Dec 12, 1940, 80 y.o.   MRN: [id number]   Reason for Appointment:  Hypothyroidism, followup visit     History of Present Illness:   The hypothyroidism was first diagnosed after radioactive iodine treatment for [name] several years ago  Previously he had lost significant amount of weight with his worsening heart failure and subsequently this stabilized More recently his weight has improved somewhat and is stable.  Previously was taking 200 mcg daily after 3/15 when he was hospitalized   Subsequently the dose was adjusted periodically    Current regimen: he has been taking 137 mcg daily, previously was taking 6-1/2 tablets a week Taking EUTHYROX brand as before  He did not feel any different with increasing his dosage in April, as before has no fatigue or cold intolerance His weight is unchanged since his last visit  He is very regular with taking the tablet in the morning daily 30 minutes before breakfast with water.   Does not miss any doses Does not take any iron or calcium-containing vitamins at the same time  His TSH is back to normal  Free T4 relatively higher    Wt Readings from Last 3 Encounters:  04/01/21 171 lb (77.6 kg)  03/20/21 171 lb 6.4 oz (77.7 kg)  02/21/21 171 lb 9.6 oz (77.8 kg)    Lab Results  Component Value Date   TSH 0.98 03/28/2021   TSH 5.93 (H) 12/24/2020   TSH 1.33 06/04/2020   FREET4 1.14 03/28/2021   FREET4 1.02 12/24/2020   FREET4 1.21 06/04/2020      Allergies as of 04/01/2021       Reactions   Ace Inhibitors    Hypotension and elevated creatinine   Amiodarone Other (See Comments)   Cerebellar side effects   Diovan [valsartan] Other (See Comments)   Hypotension at low dose, hyperkalemia   Lipitor [atorvastatin Calcium] Other (See Comments)   Muscle pain   Jardiance [empagliflozin] Other (See Comments)   Unsure of exact side effect   Norvasc [amlodipine] Other (See  Comments)   Put patient to sleep        Medication List        Accurate as of April 01, 2021 11:18 AM. If you have any questions, ask your nurse or doctor.          acetaminophen 500 MG tablet Commonly known as: TYLENOL Take 500-1,000 mg by mouth every 6 (six) hours as needed (pain).   aspirin EC 81 MG tablet Take 81 mg by mouth in the morning.   cholecalciferol 25 MCG (1000 UNIT) tablet Commonly known as: VITAMIN D3 Take 1,000 Units by mouth in the morning.   citalopram 10 MG tablet Commonly known as: CELEXA Take 1 tablet by mouth once daily   CoQ10 200 MG Caps Take 200 mg by mouth in the morning.   diphenhydrAMINE 25 MG tablet Commonly known as: SOMINEX Take 25 mg by mouth at bedtime as needed for sleep.   famotidine 20 MG tablet Commonly known as: PEPCID Take 20 mg by mouth in the morning.   furosemide 20 MG tablet Commonly known as: LASIX Take 1 tablet (20 mg total) by mouth daily as needed (Take 20 mg as daily as needed for wt > 153 lbs). Reported on 01/14/2016   levothyroxine 137 MCG tablet Commonly known as: Euthyrox Take 1 tablet (137 mcg total) by mouth daily before breakfast.   pantoprazole 40 MG tablet  Commonly known as: PROTONIX Take 1 tablet (40 mg total) by mouth daily.   potassium chloride SA 20 MEQ tablet Commonly known as: KLOR-CON Take 1 tablet (20 mEq total) by mouth daily as needed. Take one potassium pill when you take your lasix.   PROBIOTIC PO Take 1 capsule by mouth in the morning.   rosuvastatin 20 MG tablet Commonly known as: CRESTOR Take 1 tablet (20 mg total) by mouth daily.   sotalol 80 MG tablet Commonly known as: BETAPACE TAKE 1 TABLET (80 MG TOTAL) BY MOUTH DAILY.   traZODone 50 MG tablet Commonly known as: DESYREL Take 1 tablet (50 mg total) by mouth at bedtime.   warfarin 5 MG tablet Commonly known as: COUMADIN Take as directed by the anticoagulation clinic. If you are unsure how to take this medication, talk  to your nurse or doctor. Original instructions: Take 5 mg (1 tablet) every Monday/Friday and 7.5 mg (1.5 tablets) all other days OR as directed by HF Clinic         Past Medical History:  Diagnosis Date   Anxiety    Automatic implantable cardioverter-defibrillator in situ    CAD (coronary artery disease)    S/P stenting of LCX in 2004;  s/p Lat MI 2009 with occlusion of the LCX - treated with Promus stenting. Has total occlusion of the RCA.    CHF (congestive heart failure) (HCC)    Hypercholesteremia    Hypertension    Hypothyroidism    ICD (implantable cardiac defibrillator) in place    PROPHYLACTIC      medtronic   Inferior MI ([place]) "? date"; 2009   Ischemic cardiomyopathy    a. 10/09 Echo: Sev LV Dysfxn, inf/lat AK, Mod MR.   Liver disease    LVAD (left ventricular assist device) present ([place])    Osteoarthritis    a. s/p R TKR   Pacemaker    PVC (premature ventricular contraction)    RBBB    Shortness of breath     Past Surgical History:  Procedure Laterality Date   CARPAL TUNNEL RELEASE Right 05/29/2016   Procedure: CARPAL TUNNEL RELEASE;  Surgeon: Johnny Server, MD;  Location: [place];  Service: Orthopedics;  Laterality: Right;   CORONARY ANGIOPLASTY WITH STENT PLACEMENT  2004   CORONARY ANGIOPLASTY WITH STENT PLACEMENT  07/2008   DEFIBRILLATOR  2009   ESOPHAGOGASTRODUODENOSCOPY N/A 09/15/2013   Procedure: ESOPHAGOGASTRODUODENOSCOPY (EGD);  Surgeon: Johnny Artist, MD;  Location: [facility] ENDOSCOPY;  Service: Endoscopy;  Laterality: N/A;  bedside   HEMATOMA EVACUATION Right 11/06/2013   Procedure: EVACUATION HEMATOMA;  Surgeon: Johnny Pollack, MD;  Location: [place];  Service: Cardiothoracic;  Laterality: Right;   ICD GENERATOR CHANGEOUT N/A 06/28/2019   Procedure: ICD GENERATOR CHANGEOUT;  Surgeon: Johnny Haw, MD;  Location: [place] CV LAB;  Service: Cardiovascular;  Laterality: N/A;   INSERT / REPLACE / REMOVE PACEMAKER  2009   INSERTION OF IMPLANTABLE LEFT  VENTRICULAR ASSIST DEVICE N/A 09/18/2013   Procedure: INSERTION OF IMPLANTABLE LEFT VENTRICULAR ASSIST DEVICE;  Surgeon: Johnny Poot, MD;  Location: [place];  Service: Open Heart Surgery;  Laterality: N/A;  CIRC ARREST  NITRIC OXIDE   INTRA-AORTIC BALLOON PUMP INSERTION N/A 09/14/2013   Procedure: INTRA-AORTIC BALLOON PUMP INSERTION;  Surgeon: Jolaine Artist, MD;  Location[facility] CATH LAB;  Service: Cardiovascular;  Laterality: N/A;   INTRAOPERATIVE TRANSESOPHAGEAL ECHOCARDIOGRAM N/A 09/18/2013   Procedure: INTRAOPERATIVE TRANSESOPHAGEAL ECHOCARDIOGRAM;  Surgeon: Johnny Poot, MD;  Location: [facility]  OR;  Service: Open Heart Surgery;  Laterality: N/A;   KNEE ARTHROSCOPY     bilaterally   KNEE ARTHROSCOPY W/ PARTIAL MEDIAL MENISCECTOMY  12/2002   left   LEFT VENTRICULAR ASSIST DEVICE     RIGHT HEART CATHETERIZATION N/A 08/25/2013   Procedure: RIGHT HEART CATH;  Surgeon: Jolaine Artist, MD;  Location: [facility] CATH LAB;  Service: Cardiovascular;  Laterality: N/A;   RIGHT HEART CATHETERIZATION N/A 09/13/2013   Procedure: RIGHT HEART CATH;  Surgeon: Jolaine Artist, MD;  Location: [facility] CATH LAB;  Service: Cardiovascular;  Laterality: N/A;   RIGHT HEART CATHETERIZATION N/A 02/08/2014   Procedure: RIGHT HEART CATH;  Surgeon: Jolaine Artist, MD;  [facility] CATH LAB;  Service: Cardiovascular;  Laterality: N/A;   RIGHT HEART CATHETERIZATION N/A 02/12/2014   Procedure: RIGHT HEART CATH;  Surgeon: Jolaine Artist, MD;  Location: [facility] CATH LAB;  Service: Cardiovascular;  Laterality: N/A;   TOTAL KNEE ARTHROPLASTY  11/27/11   left   TOTAL KNEE ARTHROPLASTY  11/27/2011   Procedure: TOTAL KNEE ARTHROPLASTY;  Surgeon: Yvette Rack., MD;  Location: [place];  Service: Orthopedics;  Laterality: Left;  left total knee arthroplasty   VIDEO ASSISTED THORACOSCOPY Right 11/06/2013   Procedure: VIDEO ASSISTED THORACOSCOPY;  Surgeon: Johnny Pollack, MD;  Location: [facility] OR;  Service: Cardiothoracic;  Laterality: Right;     Family History  Problem Relation Age of Onset   Heart failure Father    Stroke Father    Coronary artery disease Mother    Heart disease Mother    Hyperlipidemia Sister     Social History:  reports that he has never smoked. He has never used smokeless tobacco. He reports that he does not drink alcohol and does not use drugs.  Allergies:  Allergies  Allergen Reactions   Ace Inhibitors     Hypotension and elevated creatinine   Amiodarone Other (See Comments)    Cerebellar side effects   Diovan [Valsartan] Other (See Comments)    Hypotension at low dose, hyperkalemia   Lipitor [Atorvastatin Calcium] Other (See Comments)    Muscle pain   Jardiance [Empagliflozin] Other (See Comments)    Unsure of exact side effect   Norvasc [Amlodipine] Other (See Comments)    Put patient to sleep   ROS:  Cardiomyopathy: He has been treated with a ventricle assist device and he is on Coumadin     Examination:   Pulse (!) 32   Ht '5\' 7"'$  (1.702 m)   Wt 171 lb (77.6 kg)   SpO2 94%   BMI 26.78 kg/m   Biceps reflexes difficult to elicit Neck exam shows no thyroid enlargement Skin appears normal    Assessment/Plan:   Post ablative Hypothyroidism:   His levothyroxine dose was adjusted slightly on his last visit in April He is taking Euthyrox brand and now using 137 mcg 1 tablet daily  He does not feel any different with the dosage adjustment TSH which was about 6 is back to normal  He will continue to take 7 tablets a week and follow-up in 6 months  Reminded him that he will not take any vitamin supplements if needed separately from the thyroid pills  There are no Patient Instructions on file for this visit.    Elayne Snare 04/01/2021, 11:18 AM

## 2021-04-01 ENCOUNTER — Ambulatory Visit (INDEPENDENT_AMBULATORY_CARE_PROVIDER_SITE_OTHER): Payer: Medicare Other | Admitting: Endocrinology

## 2021-04-01 ENCOUNTER — Encounter: Payer: Self-pay | Admitting: Endocrinology

## 2021-04-01 ENCOUNTER — Other Ambulatory Visit: Payer: Self-pay

## 2021-04-01 VITALS — HR 32 | Ht 67.0 in | Wt 171.0 lb

## 2021-04-01 DIAGNOSIS — E890 Postprocedural hypothyroidism: Secondary | ICD-10-CM

## 2021-04-01 DIAGNOSIS — I255 Ischemic cardiomyopathy: Secondary | ICD-10-CM | POA: Diagnosis not present

## 2021-04-04 ENCOUNTER — Other Ambulatory Visit (HOSPITAL_COMMUNITY): Payer: Self-pay | Admitting: Unknown Physician Specialty

## 2021-04-04 DIAGNOSIS — Z7901 Long term (current) use of anticoagulants: Secondary | ICD-10-CM

## 2021-04-04 DIAGNOSIS — Z95811 Presence of heart assist device: Secondary | ICD-10-CM

## 2021-04-04 DIAGNOSIS — I472 Ventricular tachycardia, unspecified: Secondary | ICD-10-CM

## 2021-04-10 ENCOUNTER — Other Ambulatory Visit (HOSPITAL_COMMUNITY): Payer: Medicare Other

## 2021-04-10 ENCOUNTER — Other Ambulatory Visit: Payer: Self-pay

## 2021-04-10 ENCOUNTER — Ambulatory Visit (HOSPITAL_COMMUNITY)
Admission: RE | Admit: 2021-04-10 | Discharge: 2021-04-10 | Disposition: A | Discharge status: Home / Self Care | Payer: Medicare Other | Source: Ambulatory Visit | Attending: Internal Medicine | Admitting: Internal Medicine

## 2021-04-10 ENCOUNTER — Ambulatory Visit (HOSPITAL_COMMUNITY): Payer: Self-pay | Admitting: Pharmacist

## 2021-04-10 ENCOUNTER — Encounter (HOSPITAL_COMMUNITY): Payer: Self-pay

## 2021-04-10 DIAGNOSIS — Z7901 Long term (current) use of anticoagulants: Secondary | ICD-10-CM | POA: Insufficient documentation

## 2021-04-10 DIAGNOSIS — Z95811 Presence of heart assist device: Secondary | ICD-10-CM | POA: Diagnosis not present

## 2021-04-10 LAB — PROTIME-INR
INR: 3 — ABNORMAL HIGH (ref 0.8–1.2)
Prothrombin Time: 30.8 seconds — ABNORMAL HIGH (ref 11.4–15.2)

## 2021-04-10 LAB — LACTATE DEHYDROGENASE: LDH: 87 U/L — ABNORMAL LOW (ref 98–192)

## 2021-04-10 NOTE — Progress Notes (Signed)
LVAD INR 

## 2021-04-10 NOTE — Progress Notes (Signed)
Pt here for labs and BP check. Pt had been on Hydralazine 25 mg tid (pt reported he was taking bid most days) due to reported dizziness.  Doppler BP:  96 Auto cuff BP:  141/74 (98) HR:  61  Pt reports he still is having dizzy spells when bending over.   Dr. Haroldine Laws updated. Called patient's wife per Dr. Haroldine Laws telling patient he does not need to re-start hydralazine. [name] verbalized understanding of same.  Zada Girt RN, [place] Coordinator [phone]

## 2021-04-20 ENCOUNTER — Encounter (HOSPITAL_COMMUNITY): Payer: Self-pay | Admitting: Emergency Medicine

## 2021-04-20 ENCOUNTER — Other Ambulatory Visit: Payer: Self-pay

## 2021-04-20 ENCOUNTER — Emergency Department (HOSPITAL_COMMUNITY): Payer: Medicare Other

## 2021-04-20 ENCOUNTER — Emergency Department (HOSPITAL_COMMUNITY)
Admission: EM | Admit: 2021-04-20 | Discharge: 2021-04-20 | Disposition: A | Discharge status: Home / Self Care | Payer: Medicare Other | Attending: Emergency Medicine | Admitting: Emergency Medicine

## 2021-04-20 DIAGNOSIS — Z950 Presence of cardiac pacemaker: Secondary | ICD-10-CM | POA: Diagnosis not present

## 2021-04-20 DIAGNOSIS — R42 Dizziness and giddiness: Secondary | ICD-10-CM | POA: Insufficient documentation

## 2021-04-20 DIAGNOSIS — Z79899 Other long term (current) drug therapy: Secondary | ICD-10-CM | POA: Insufficient documentation

## 2021-04-20 DIAGNOSIS — E039 Hypothyroidism, unspecified: Secondary | ICD-10-CM | POA: Insufficient documentation

## 2021-04-20 DIAGNOSIS — Z96652 Presence of left artificial knee joint: Secondary | ICD-10-CM | POA: Diagnosis not present

## 2021-04-20 DIAGNOSIS — I517 Cardiomegaly: Secondary | ICD-10-CM | POA: Diagnosis not present

## 2021-04-20 DIAGNOSIS — I110 Hypertensive heart disease with heart failure: Secondary | ICD-10-CM | POA: Insufficient documentation

## 2021-04-20 DIAGNOSIS — I2510 Atherosclerotic heart disease of native coronary artery without angina pectoris: Secondary | ICD-10-CM | POA: Insufficient documentation

## 2021-04-20 DIAGNOSIS — Z7901 Long term (current) use of anticoagulants: Secondary | ICD-10-CM | POA: Diagnosis not present

## 2021-04-20 DIAGNOSIS — I5022 Chronic systolic (congestive) heart failure: Secondary | ICD-10-CM | POA: Insufficient documentation

## 2021-04-20 DIAGNOSIS — Z7982 Long term (current) use of aspirin: Secondary | ICD-10-CM | POA: Diagnosis not present

## 2021-04-20 DIAGNOSIS — Z95811 Presence of heart assist device: Secondary | ICD-10-CM | POA: Insufficient documentation

## 2021-04-20 DIAGNOSIS — R0602 Shortness of breath: Secondary | ICD-10-CM | POA: Diagnosis not present

## 2021-04-20 LAB — CBC WITH DIFFERENTIAL/PLATELET
Abs Immature Granulocytes: 0.02 10*3/uL (ref 0.00–0.07)
Basophils Absolute: 0 10*3/uL (ref 0.0–0.1)
Basophils Relative: 1 %
Eosinophils Absolute: 0.3 10*3/uL (ref 0.0–0.5)
Eosinophils Relative: 5 %
HCT: 35.2 % — ABNORMAL LOW (ref 39.0–52.0)
Hemoglobin: 12.3 g/dL — ABNORMAL LOW (ref 13.0–17.0)
Immature Granulocytes: 0 %
Lymphocytes Relative: 24 %
Lymphs Abs: 1.3 10*3/uL (ref 0.7–4.0)
MCH: 31.9 pg (ref 26.0–34.0)
MCHC: 34.9 g/dL (ref 30.0–36.0)
MCV: 91.2 fL (ref 80.0–100.0)
Monocytes Absolute: 0.6 10*3/uL (ref 0.1–1.0)
Monocytes Relative: 10 %
Neutro Abs: 3.4 10*3/uL (ref 1.7–7.7)
Neutrophils Relative %: 60 %
Platelets: 160 10*3/uL (ref 150–400)
RBC: 3.86 MIL/uL — ABNORMAL LOW (ref 4.22–5.81)
RDW: 15.1 % (ref 11.5–15.5)
WBC: 5.6 10*3/uL (ref 4.0–10.5)
nRBC: 0 % (ref 0.0–0.2)

## 2021-04-20 LAB — BASIC METABOLIC PANEL
Anion gap: 6 (ref 5–15)
BUN: 27 mg/dL — ABNORMAL HIGH (ref 8–23)
CO2: 23 mmol/L (ref 22–32)
Calcium: 9.1 mg/dL (ref 8.9–10.3)
Chloride: 106 mmol/L (ref 98–111)
Creatinine, Ser: 2.02 mg/dL — ABNORMAL HIGH (ref 0.61–1.24)
GFR, Estimated: 33 mL/min — ABNORMAL LOW (ref >60)
Glucose, Bld: 100 mg/dL — ABNORMAL HIGH (ref 70–99)
Potassium: 4.2 mmol/L (ref 3.5–5.1)
Sodium: 135 mmol/L (ref 135–145)

## 2021-04-20 LAB — MAGNESIUM: Magnesium: 2.2 mg/dL (ref 1.7–2.4)

## 2021-04-20 LAB — LACTATE DEHYDROGENASE: LDH: 236 U/L — ABNORMAL HIGH (ref 98–192)

## 2021-04-20 LAB — PROTIME-INR
INR: 3.2 — ABNORMAL HIGH (ref 0.8–1.2)
Prothrombin Time: 33 seconds — ABNORMAL HIGH (ref 11.4–15.2)

## 2021-04-20 MED ORDER — SODIUM CHLORIDE 0.9 % IV BOLUS
500.0000 mL | Freq: Once | INTRAVENOUS | Status: AC
  Administered 2021-04-20: 500 mL via INTRAVENOUS

## 2021-04-20 NOTE — ED Notes (Signed)
Notified provider about ICD report.

## 2021-04-20 NOTE — ED Notes (Signed)
ICD is functioning properly. Showing fluid overload 60 bpm.

## 2021-04-20 NOTE — ED Notes (Signed)
EDP at the bedside. IV team order due to difficult stick.

## 2021-04-20 NOTE — ED Notes (Signed)
Secretary notified LVAD team.

## 2021-04-20 NOTE — ED Triage Notes (Signed)
Pt reports he was told to come to the ER for an EKG due to lightheadedness. Pt is LVAD patient. Pt has no complaints at this time.

## 2021-04-20 NOTE — Discharge Instructions (Addendum)
Follow up with Dr. Haroldine Laws in clinic this week.

## 2021-04-20 NOTE — ED Notes (Signed)
Doppler 126

## 2021-04-20 NOTE — ED Provider Notes (Addendum)
[facility] EMERGENCY DEPARTMENT Provider Note   CSN: [id number] Arrival date & time: 04/20/21  1621     History Chief Complaint  Patient presents with   LVAD/VT    Johnny Oneill is a 80 y.o. male.  80 year old male with prior medical history as detailed below presents for evaluation.  Patient with longstanding history of LVAD.  Patient reports feeling lightheaded earlier today.  Per report, PI was as low as 3.  Patient increased fluid intake.  Patient did contact the heart failure clinic.  He is followed by Dr. Haroldine Laws.  He was advised to come to the ED for evaluation.  He denies associated chest pain or shortness of breath.  He reports resolution of lightheadedness.  He denies any symptoms at this time.    PI on arrival is ranging from 6.2-7.  This appears to be close to his baseline.  He denies shocks from ICD.  The history is provided by the spouse and the patient.  Illness Location:  LVAD patient, lightheaded, decreased PI Severity:  Mild Onset quality:  Gradual Duration:  1 day Timing:  Unable to specify Progression:  Resolved Chronicity:  New     Past Medical History:  Diagnosis Date   Anxiety    Automatic implantable cardioverter-defibrillator in situ    CAD (coronary artery disease)    S/P stenting of LCX in 2004;  s/p Lat MI 2009 with occlusion of the LCX - treated with Promus stenting. Has total occlusion of the RCA.    CHF (congestive heart failure) (HCC)    Hypercholesteremia    Hypertension    Hypothyroidism    ICD (implantable cardiac defibrillator) in place    PROPHYLACTIC      medtronic   Inferior MI ([place]) "? date"; 2009   Ischemic cardiomyopathy    a. 10/09 Echo: Sev LV Dysfxn, inf/lat AK, Mod MR.   Liver disease    LVAD (left ventricular assist device) present ([place])    Osteoarthritis    a. s/p R TKR   Pacemaker    PVC (premature ventricular contraction)    RBBB    Shortness of breath     Patient Active Problem List    Diagnosis Date Noted   Chronic systolic CHF (congestive heart failure) ([place]) 01/02/2015   Decreased appetite 11/01/2014   Contact dermatitis 09/11/2014   Inguinal hernia 08/21/2014   Hemothorax 02/08/2014   HTN (hypertension) 01/30/2014   Chronic anticoagulation 01/08/2014   Hypothyroid 12/20/2013   Syncope 11/28/2013   Hemothorax on right 11/06/2013   Poor venous access 11/02/2013   LVAD (left ventricular assist device) present ([place]) 09/18/2013   Weakness generalized 09/15/2013   Nonspecific (abnormal) findings on radiological and other examination of gastrointestinal tract 09/14/2013   Anemia, unspecified 09/14/2013   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   PVC (premature ventricular contraction) 12/08/2012   VT (ventricular tachycardia) ([place]) 12/08/2012   Essential hypertension, benign 11/12/2010   ISCHEMIC CARDIOMYOPATHY [id number]   Chronic systolic heart failure ([place]) 11/12/2010   Ischemic cardiomyopathy    Myocardial infarction of lateral wall (HCC)    Hypercholesteremia    Automatic implantable cardioverter-defibrillator in situ    CAD (coronary artery disease)    Osteoarthritis     Past Surgical History:  Procedure Laterality Date   CARPAL TUNNEL RELEASE Right 05/29/2016   Procedure: CARPAL TUNNEL RELEASE;  Surgeon: Earlie Server, MD;  Location: [place];  Service: Orthopedics;  Laterality: Right;   CORONARY ANGIOPLASTY WITH STENT PLACEMENT  2004  CORONARY ANGIOPLASTY WITH STENT PLACEMENT  07/2008   DEFIBRILLATOR  2009   ESOPHAGOGASTRODUODENOSCOPY N/A 09/15/2013   Procedure: ESOPHAGOGASTRODUODENOSCOPY (EGD);  Surgeon: Ladene Artist, MD;  Location[facility] END[facility]COPY;  Service: Endoscopy;  Laterality: N/A;  bedside   HEMATOMA EVACUATION Right 11/06/2013   Procedure: EVACUATION HEMATOMA;  Surgeon: Gaye Pollack, MD;  Location: [facility];  Service: Cardiothoracic;  Laterality: Right;   ICD GENERATOR CHANGEOUT N/A 06/28/2019   Procedure: ICD GENERATOR CHANGEOUT;  Surgeon: Constance Haw, MD;  Location: [place] CV LAB;  Service: Cardiovascular;  Laterality: N/A;   INSERT / REPLACE / REMOVE PACEMAKER  2009   INSERTION OF IMPLANTABLE LEFT VENTRICULAR ASSIST DEVICE N/A 09/18/2013   Procedure: INSERTION OF IMPLANTABLE LEFT VENTRICULAR ASSIST DEVICE;  Surgeon: Ivin Poot, MD;  Location: [place];  Service: Open Heart Surgery;  Laterality: N/A;  CIRC ARREST  NITRIC OXIDE   INTRA-AORTIC BALLOON PUMP INSERTION N/A 09/14/2013   Procedure: INTRA-AORTIC BALLOON PUMP INSERTION;  Surgeon: Jolaine Artist, MD;  Location: [facility] CATH LAB;  Service: Cardiovascular;  Laterality: N/A;   INTRAOPERATIVE TRANSESOPHAGEAL ECHOCARDIOGRAM N/A 09/18/2013   Procedure: INTRAOPERATIVE TRANSESOPHAGEAL ECHOCARDIOGRAM;  Surgeon: Ivin Poot, MD;  Location: [place];  Service: Open Heart Surgery;  Laterality: N/A;   KNEE ARTHROSCOPY     bilaterally   KNEE ARTHROSCOPY W/ PARTIAL MEDIAL MENISCECTOMY  12/2002   left   LEFT VENTRICULAR ASSIST DEVICE     RIGHT HEART CATHETERIZATION N/A 08/25/2013   Procedure: RIGHT HEART CATH;  Surgeon: Jolaine Artist, MD;  Location: [facility] CATH LAB;  Service: Cardiovascular;  Laterality: N/A;   RIGHT HEART CATHETERIZATION N/A 09/13/2013   Procedure: RIGHT HEART CATH;  Surgeon: Jolaine Artist, MD;  Location: [facility] CATH LAB;  Service: Cardiovascular;  Laterality: N/A;   RIGHT HEART CATHETERIZATION N/A 02/08/2014   Procedure: RIGHT HEART CATH;  Surgeon: Jolaine Artist, MD;  Location: [facility] CATH LAB;  Service: Cardiovascular;  Laterality: N/A;   RIGHT HEART CATHETERIZATION N/A 02/12/2014   Procedure: RIGHT HEART CATH;  Surgeon: Jolaine Artist, MD;  [facility] CATH LAB;  Service: Cardiovascular;  Laterality: N/A;   TOTAL KNEE ARTHROPLASTY  11/27/11   left   TOTAL KNEE ARTHROPLASTY  11/27/2011   Procedure: TOTAL KNEE ARTHROPLASTY;  Surgeon: Yvette Rack., MD;  Location: [place];  Service: Orthopedics;  Laterality: Left;  left total knee arthroplasty   VIDEO  ASSISTED THORACOSCOPY Right 11/06/2013   Procedure: VIDEO ASSISTED THORACOSCOPY;  Surgeon: Gaye Pollack, MD;  Location: [facility] OR;  Service: Cardiothoracic;  Laterality: Right;       Family History  Problem Relation Age of Onset   Heart failure Father    Stroke Father    Coronary artery disease Mother    Heart disease Mother    Hyperlipidemia Sister     Social History   Tobacco Use   Smoking status: Never   Smokeless tobacco: Never  Vaping Use   Vaping Use: Never used  Substance Use Topics   Alcohol use: No   Drug use: No    Types: Marijuana    Comment: "back in our younger days we smoked a little pot"    Home Medications Prior to Admission medications   Medication Sig Start Date End Date Taking? Authorizing Provider  acetaminophen (TYLENOL) 500 MG tablet Take 500-1,000 mg by mouth every 6 (six) hours as needed (pain).    [provider]  aspirin EC 81 MG tablet Take 81 mg by  mouth in the morning.    [provider]  cholecalciferol (VITAMIN D3) 25 MCG (1000 UT) tablet Take 1,000 Units by mouth in the morning.    [provider]  citalopram (CELEXA) 10 MG tablet Take 1 tablet by mouth once daily 03/19/21   Bensimhon, Shaune Pascal, MD  Coenzyme Q10 (COQ10) 200 MG CAPS Take 200 mg by mouth in the morning.    [provider]  diphenhydrAMINE (SOMINEX) 25 MG tablet Take 25 mg by mouth at bedtime as needed for sleep.    [provider]  famotidine (PEPCID) 20 MG tablet Take 20 mg by mouth in the morning.    [provider]  furosemide (LASIX) 20 MG tablet Take 1 tablet (20 mg total) by mouth daily as needed (Take 20 mg as daily as needed for wt > 153 lbs). Reported on 01/14/2016 02/21/21   Shirley Friar, PA-C  levothyroxine (EUTHYROX) 137 MCG tablet Take 1 tablet (137 mcg total) by mouth daily before breakfast. 02/06/21   Elayne Snare, MD  pantoprazole (PROTONIX) 40 MG tablet Take 1 tablet (40 mg total) by mouth daily. 08/22/20    Bensimhon, Shaune Pascal, MD  potassium chloride SA (KLOR-CON) 20 MEQ tablet Take 1 tablet (20 mEq total) by mouth daily as needed. Take one potassium pill when you take your lasix. 02/21/21   Shirley Friar, PA-C  Probiotic Product (PROBIOTIC PO) Take 1 capsule by mouth in the morning.    [provider]  rosuvastatin (CRESTOR) 20 MG tablet Take 1 tablet (20 mg total) by mouth daily. 11/26/20   Bensimhon, Shaune Pascal, MD  sotalol (BETAPACE) 80 MG tablet TAKE 1 TABLET (80 MG TOTAL) BY MOUTH DAILY. 07/10/20 07/10/21  Bensimhon, Shaune Pascal, MD  traZODone (DESYREL) 50 MG tablet Take 1 tablet (50 mg total) by mouth at bedtime. 12/31/20   Bensimhon, Shaune Pascal, MD  warfarin (COUMADIN) 5 MG tablet Take 5 mg (1 tablet) every Monday/Friday and 7.5 mg (1.5 tablets) all other days OR as directed by HF Clinic 03/20/21   Larey Dresser, MD    Allergies    Ace inhibitors, Amiodarone, Diovan [valsartan], Lipitor [atorvastatin calcium], Jardiance [empagliflozin], and Norvasc [amlodipine]  Review of Systems   Review of Systems  All other systems reviewed and are negative.  Physical Exam Updated Vital Signs There were no vitals taken for this visit.  Physical Exam Vitals and nursing note reviewed.  Constitutional:      General: He is not in acute distress.    Appearance: Normal appearance. He is well-developed.  HENT:     Head: Normocephalic and atraumatic.  Eyes:     Conjunctiva/sclera: Conjunctivae normal.     Pupils: Pupils are equal, round, and reactive to light.  Cardiovascular:     Rate and Rhythm: Normal rate and regular rhythm.     Comments: LVAD in place  Pulmonary:     Effort: Pulmonary effort is normal. No respiratory distress.     Breath sounds: Normal breath sounds.  Abdominal:     General: There is no distension.     Palpations: Abdomen is soft.     Tenderness: There is no abdominal tenderness.  Musculoskeletal:        General: No deformity. Normal range of motion.      Cervical back: Normal range of motion and neck supple.  Skin:    General: Skin is warm and dry.  Neurological:     General: No focal deficit present.  Mental Status: He is alert and oriented to person, place, and time.    ED Results / Procedures / Treatments   Labs (all labs ordered are listed, but only abnormal results are displayed) Labs Reviewed  BASIC METABOLIC PANEL  CBC WITH DIFFERENTIAL/PLATELET  PROTIME-INR    EKG EKG Interpretation  Date/Time:  Sunday April 20 2021 16:54:28 EDT Ventricular Rate:  77 PR Interval:  275 QRS Duration: 167 QT Interval:  441 QTC Calculation: 373 R Axis:   -73 Text Interpretation: Sinus rhythm Paired ventricular premature complexes Prolonged PR interval IVCD, consider atypical RBBB Anterolateral infarct, age indeterminate Artifact in lead(s) I II III aVR aVL aVF V1 V3 V4 V5 V6 Confirmed by Dene Gentry [phone]) on 04/20/2021 4:59:09 PM  Radiology No results found.  Procedures Procedures   Medications Ordered in ED Medications - No data to display  ED Course  I have reviewed the triage vital signs and the nursing notes.  Pertinent labs & imaging results that were available during my care of the patient were reviewed by me and considered in my medical decision making (see chart for details).    MDM Rules/Calculators/A&P                           MDM  MSE complete  Johnny Oneill was evaluated in Emergency Department on 04/20/2021 for the symptoms described in the history of present illness. He was evaluated in the context of the global COVID-19 pandemic, which necessitated consideration that the patient might be at risk for infection with the SARS-CoV-2 virus that causes COVID-19. Institutional protocols and algorithms that pertain to the evaluation of patients at risk for COVID-19 are in a state of rapid change based on information released by regulatory bodies including the CDC and federal and state organizations. These  policies and algorithms were followed during the patient's care in the ED.  Patient presented with complaint of lightheadedness.  Patient with history of LVAD.  Patient reports that his PI was as low as 3 earlier today.  Patient is known to Dr. Haroldine Laws.  Patient initially noted to have frequent PVCs on monitor.  Patient given a 500 cc bolus of fluid.  Screening labs obtained are without significant abnormality.  Patient's pacemaker adjusted to a rate of 80.  PI improved to 6.4-7. PVC's decreased.  Patient does understand need for close follow-up with Dr. Haroldine Laws in the office later this week.  At time of discharge patient is asymptomatic and feels improved.   Final Clinical Impression(s) / ED Diagnoses Final diagnoses:  Lightheadedness  LVAD (left ventricular assist device) present [facility])    Rx / DC Orders ED Discharge Orders     None        Valarie Merino, MD 04/20/21 1950    Valarie Merino, MD 06/03/21 1433

## 2021-04-20 NOTE — ED Notes (Signed)
Xray at the bedside.

## 2021-04-22 ENCOUNTER — Other Ambulatory Visit (HOSPITAL_COMMUNITY): Payer: Self-pay | Admitting: *Deleted

## 2021-04-22 DIAGNOSIS — Z7901 Long term (current) use of anticoagulants: Secondary | ICD-10-CM

## 2021-04-22 DIAGNOSIS — Z95811 Presence of heart assist device: Secondary | ICD-10-CM

## 2021-04-23 ENCOUNTER — Other Ambulatory Visit: Payer: Self-pay

## 2021-04-23 ENCOUNTER — Encounter (HOSPITAL_COMMUNITY): Payer: Self-pay

## 2021-04-23 ENCOUNTER — Ambulatory Visit (HOSPITAL_COMMUNITY)
Admission: RE | Admit: 2021-04-23 | Discharge: 2021-04-23 | Disposition: A | Discharge status: Home / Self Care | Payer: Medicare Other | Source: Ambulatory Visit | Attending: Cardiology | Admitting: Cardiology

## 2021-04-23 VITALS — BP 112/92 | HR 79 | Wt 173.8 lb

## 2021-04-23 DIAGNOSIS — N1832 Chronic kidney disease, stage 3b: Secondary | ICD-10-CM | POA: Diagnosis not present

## 2021-04-23 DIAGNOSIS — Z95810 Presence of automatic (implantable) cardiac defibrillator: Secondary | ICD-10-CM | POA: Insufficient documentation

## 2021-04-23 DIAGNOSIS — I255 Ischemic cardiomyopathy: Secondary | ICD-10-CM | POA: Diagnosis not present

## 2021-04-23 DIAGNOSIS — Z95811 Presence of heart assist device: Secondary | ICD-10-CM | POA: Diagnosis not present

## 2021-04-23 DIAGNOSIS — M1711 Unilateral primary osteoarthritis, right knee: Secondary | ICD-10-CM | POA: Insufficient documentation

## 2021-04-23 DIAGNOSIS — Z79899 Other long term (current) drug therapy: Secondary | ICD-10-CM | POA: Diagnosis not present

## 2021-04-23 DIAGNOSIS — I472 Ventricular tachycardia, unspecified: Secondary | ICD-10-CM

## 2021-04-23 DIAGNOSIS — N183 Chronic kidney disease, stage 3 unspecified: Secondary | ICD-10-CM | POA: Diagnosis not present

## 2021-04-23 DIAGNOSIS — Z7901 Long term (current) use of anticoagulants: Secondary | ICD-10-CM | POA: Insufficient documentation

## 2021-04-23 DIAGNOSIS — I5022 Chronic systolic (congestive) heart failure: Secondary | ICD-10-CM | POA: Insufficient documentation

## 2021-04-23 DIAGNOSIS — I213 ST elevation (STEMI) myocardial infarction of unspecified site: Secondary | ICD-10-CM | POA: Diagnosis not present

## 2021-04-23 DIAGNOSIS — I4892 Unspecified atrial flutter: Secondary | ICD-10-CM | POA: Insufficient documentation

## 2021-04-23 DIAGNOSIS — I2510 Atherosclerotic heart disease of native coronary artery without angina pectoris: Secondary | ICD-10-CM | POA: Insufficient documentation

## 2021-04-23 DIAGNOSIS — Z7982 Long term (current) use of aspirin: Secondary | ICD-10-CM | POA: Diagnosis not present

## 2021-04-23 DIAGNOSIS — M25561 Pain in right knee: Secondary | ICD-10-CM | POA: Diagnosis not present

## 2021-04-23 DIAGNOSIS — I493 Ventricular premature depolarization: Secondary | ICD-10-CM

## 2021-04-23 DIAGNOSIS — E039 Hypothyroidism, unspecified: Secondary | ICD-10-CM | POA: Diagnosis not present

## 2021-04-23 DIAGNOSIS — I130 Hypertensive heart and chronic kidney disease with heart failure and stage 1 through stage 4 chronic kidney disease, or unspecified chronic kidney disease: Secondary | ICD-10-CM | POA: Insufficient documentation

## 2021-04-23 DIAGNOSIS — G8929 Other chronic pain: Secondary | ICD-10-CM | POA: Diagnosis not present

## 2021-04-23 MED ORDER — TRAMADOL HCL 50 MG PO TABS: 50.0000 mg | ORAL_TABLET | Freq: Four times a day (QID) | ORAL | 5 refills | Status: DC | PRN

## 2021-04-23 NOTE — Progress Notes (Signed)
Remote ICD transmission.   

## 2021-04-23 NOTE — Patient Instructions (Addendum)
No medication changes today Take Lasix x 1 today Coumadin dosing per Lauren PharmD Return to [facility] clinic in 2 months for follow up with Dr Haroldine Laws

## 2021-04-23 NOTE — Progress Notes (Addendum)
Patient presents for ER visit f/u in [facility] today alone. Reports no problems with VAD equipment or concerns with drive line.   Pt was seen in ER 04/20/21 due to lightheadedness. Received 500 cc NS IVF. Pt found to have high PVC burden. Pacer settings adjusted DDD 80, and discharged home.   Ambulated into clinic today unassisted. Denies dizziness, falls, shortness of breath (other than when walking far distances,) or signs of bleeding. States his lightheadedness has improved since ER visit. Reports no lightheaded episodes when going from sitting to standing position since ER visit, but still has lightheaded episodes when bending over. EKG obtained today and reviewed by Dr Haroldine Laws- AV paced 80 with 1 PVC. Continue Sotalol as prescribed. Dr Haroldine Laws plans to discuss decreasing demand pacing in the future with EP team.   Admits he is not drinking 2 liters fluid per day. Pt states that he has not taken any Lasix since he was last seen in clinic. Mild amount of fluid noted on exam. Per Dr Haroldine Laws pt instructed to take Lasix today.   Pt reports increased right knee pain. He is currently wearing knee brace as instructed. Reports that Tylenol is not helping with the pain. PRN Tramadol prescription provided to patient per Dr Haroldine Laws.   No labs obtained today per Dr Haroldine Laws. Labs were drawn when pt was in ER 04/20/21.   Vital Signs:   Temp:  Doppler Pressure: 94 Automatc BP: 112/92 (100) HR: 79 SPO2: 100% on RA   Weight: 171.4 lb w/ eqt Last weight: 172.6 lb  VAD Indication: Destination therapy- patient choice     VAD interrogation & Equipment Management: Speed: 9400 Flow: 4.6 Power: 5.5w    PI: 6.6   Alarms: none Events: 20 - 40 PI events daily  Fixed speed 9400  Low speed limit: 8800   Primary Controller:  Replace back up battery in 18 months Back up controller:  Replace back up battery in 29 months -- did not bring today   Annual Equipment Maintenance on UBC/PM was  performed on 10/2020.   I reviewed the LVAD parameters from today and compared the results to the patient's prior recorded data. LVAD interrogation was NEGATIVE for significant power changes, NEGATIVE for clinical alarms and STABLE for PI events/speed drops. No programming changes were made and pump is functioning within specified parameters. Pt is performing daily controller and system monitor self tests along with completing weekly and monthly maintenance for LVAD equipment.  Exit Site Care: Drive line is being maintained weekly by [name], his wife. Dressing dry and intact. Stabilization device present and accurately applied. Pt denies fever or chills. Pt has CHG allergy. Pt has adequate dressing supplies at home.   Significant Events on VAD Support:  10/2013>SBO 12/2016> elevated LDH, admit for [place]   Device: Dual Medtronic Therapies:  V therapy on at 222 bpm; V monitor on 171         A therapy on 171 bpm (ramp and burst overdrive pacing);         A monitor on 300 bpm  Pacing: AAI<=>DDD 80 bpm (increased from 60 on 04/20/21) Last check: 03/27/21  BP & Labs:  Doppler 94 - Doppler is reflecting MAP   Hgb 12.3 in ER 04/20/21 - No S/S of bleeding. Specifically denies melena/BRBPR or nosebleeds.   LDH 236 in ER 04/20/21 - established baseline of 200- 350. Denies tea-colored urine. No power elevations noted on interrogation.   Patient Instructions:  No medication changes today Take Lasix  x 1 today Coumadin dosing per Lauren PharmD Return to [place] clinic in 2 months for follow up with Dr Haroldine Laws  Emerson Monte RN [place] Coordinator  Office: [phone]  24/7 Pager: [phone]

## 2021-04-25 ENCOUNTER — Other Ambulatory Visit (HOSPITAL_COMMUNITY): Payer: Self-pay | Admitting: Unknown Physician Specialty

## 2021-04-25 DIAGNOSIS — Z7901 Long term (current) use of anticoagulants: Secondary | ICD-10-CM

## 2021-04-25 DIAGNOSIS — Z95811 Presence of heart assist device: Secondary | ICD-10-CM

## 2021-04-27 NOTE — Progress Notes (Addendum)
VAD CLINIC NOTE   HPI:  Johnny Oneill is a 80 y.o. 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    LVAD Issues  Elevated LDH--bivalirudin. D/C LDH 188  Presented to ER on 06/27/19 with syncopal episode and head injury from fall. ECG showed VT @ 240. Underwent emergent DC-CV in ER. Head CT with no bleed. Was admitted and had ICD gen replacement (EOL). Had small hematoma at site. Amio started. Post-discharge found to have episode of AFL on ICD remote check but has since resolved.    Had ICD shock for VT on 06/09/20. Started on sotalol   On 02/06/21 Paced out of AFL.  He presents today for ER f/u. Remains on sotalol due to previous VT. Seen in ER over the weekend with frequent PVCs/bigmeniny and was quite symptomatic. Now much improved with demand RV pacing at 80 to overdrive PVCs. Denies SOB, orthopnea, palpitations, syncope/presyncope. Denies orthopnea or PND. No fevers, chills or problems with driveline. No bleeding, melena or neuro symptoms. No VAD alarms. Taking all meds as prescribed. Following with Dr. French Oneill in Ortho for severe R knee OA ("bone on bone").     VAD Indication: Destination therapy- patient choice     VAD interrogation & Equipment Management: Speed: 9400 Flow: 4.6 Power: 5.5w    PI: 6.6   Alarms: none Events: 20 - 40 PI events daily  Fixed speed 9400  Low speed limit: 8800   Primary Controller:  Replace back up battery in 18 months Back up controller:  Replace back up battery in 29 months -- did not bring today   Annual Equipment Maintenance on UBC/PM was performed on 10/2020.   I reviewed the LVAD parameters from today and compared the results to the patient's prior recorded data. LVAD interrogation was NEGATIVE for significant power changes, NEGATIVE for clinical alarms and STABLE for PI events/speed drops. No programming changes were made and pump is functioning within  specified parameters. Pt is performing daily controller and system monitor self tests along with completing weekly and monthly maintenance for LVAD equipment.      Past Medical History:  Diagnosis Date   Anxiety    Automatic implantable cardioverter-defibrillator in situ    CAD (coronary artery disease)    S/P stenting of LCX in 2004;  s/p Lat MI 2009 with occlusion of the LCX - treated with Promus stenting. Has total occlusion of the RCA.    CHF (congestive heart failure) (HCC)    Hypercholesteremia    Hypertension    Hypothyroidism    ICD (implantable cardiac defibrillator) in place    PROPHYLACTIC      medtronic   Inferior MI (Uncertain) "? date"; 2009   Ischemic cardiomyopathy    a. 10/09 Echo: Sev LV Dysfxn, inf/lat AK, Mod MR.   Liver disease    LVAD (left ventricular assist device) present ([place])    Osteoarthritis    a. s/p R TKR   Pacemaker    PVC (premature ventricular contraction)    RBBB    Shortness of breath     Current Outpatient Medications  Medication Sig Dispense Refill   acetaminophen (TYLENOL) 500 MG tablet Take 500-1,000 mg by mouth every 6 (six) hours as needed (pain).     aspirin EC 81 MG tablet Take 81 mg by mouth in the morning.     cholecalciferol (VITAMIN D3) 25 MCG (1000 UT) tablet Take  1,000 Units by mouth in the morning.     citalopram (CELEXA) 10 MG tablet Take 1 tablet by mouth once daily 90 tablet 3   Coenzyme Q10 (COQ10) 200 MG CAPS Take 200 mg by mouth in the morning.     diphenhydrAMINE (SOMINEX) 25 MG tablet Take 25 mg by mouth at bedtime as needed for sleep.     famotidine (PEPCID) 20 MG tablet Take 20 mg by mouth in the morning.     levothyroxine (EUTHYROX) 137 MCG tablet Take 1 tablet (137 mcg total) by mouth daily before breakfast. 90 tablet 1   pantoprazole (PROTONIX) 40 MG tablet Take 1 tablet (40 mg total) by mouth daily. 90 tablet 3   potassium chloride SA (KLOR-CON) 20 MEQ tablet Take 1 tablet (20 mEq total) by mouth daily as needed. Take  one potassium pill when you take your lasix. 30 tablet 6   Probiotic Product (PROBIOTIC PO) Take 1 capsule by mouth in the morning.     rosuvastatin (CRESTOR) 20 MG tablet Take 1 tablet (20 mg total) by mouth daily. 90 tablet 3   sotalol (BETAPACE) 80 MG tablet TAKE 1 TABLET (80 MG TOTAL) BY MOUTH DAILY. 90 tablet 3   traMADol (ULTRAM) 50 MG tablet Take 1-2 tablets (50-100 mg total) by mouth every 6 (six) hours as needed for moderate pain. 45 tablet 5   traZODone (DESYREL) 50 MG tablet Take 1 tablet (50 mg total) by mouth at bedtime. 30 tablet 5   warfarin (COUMADIN) 5 MG tablet Take 5 mg (1 tablet) every Monday/Friday and 7.5 mg (1.5 tablets) all other days OR as directed by HF Clinic 135 tablet 11   furosemide (LASIX) 20 MG tablet Take 1 tablet (20 mg total) by mouth daily as needed (Take 20 mg as daily as needed for wt > 153 lbs). Reported on 01/14/2016 (Patient not taking: Reported on 04/23/2021) 30 tablet 6   No current facility-administered medications for this encounter.   Facility-Administered Medications Ordered in Other Encounters  Medication Dose Route Frequency Provider Last Rate Last Admin   etomidate (AMIDATE) injection    Anesthesia Intra-op Johnny Bright, CRNA   20 mg at 02/08/14 0915   rocuronium [facility]) injection    Anesthesia Intra-op Johnny Bright, CRNA   50 mg at 02/08/14 0932   succinylcholine (ANECTINE) injection    Anesthesia Intra-op Johnny Bright, CRNA   100 mg at 02/08/14 0915   Ace inhibitors, Amiodarone, Diovan [valsartan], Lipitor [atorvastatin calcium], Jardiance [empagliflozin], and Norvasc [amlodipine]  Review of systems complete and found to be negative unless listed in HPI.      Vital Signs:  Doppler Pressure: 84 Vitals:   04/23/21 1140 04/23/21 1142  BP: (!) 94/0 (!) 112/92  Pulse: 79   SpO2: 100%        Vital Signs:   Temp:  Doppler Pressure: 94 Automatc BP: 112/92 (100) HR: 79 SPO2: 100% on RA   Weight: 171.4 lb w/ eqt Last  weight: 172.6 lb    Physical Exam: General:  NAD.  HEENT: normal  Neck: supple. JVP 6-7  Carotids 2+ bilat; no bruits. No lymphadenopathy or thryomegaly appreciated. Cor: LVAD hum.  Lungs: Clear. Abdomen: soft, nontender, non-distended. No hepatosplenomegaly. No bruits or masses. Good bowel sounds. Driveline site clean. Anchor in place.  Extremities: no cyanosis, clubbing, rash. Warm no edema  Neuro: alert & oriented x 3. No focal deficits. Moves all 4 without problem   ECG: Sinus rhythm with V pacing  80. 1 PVC. QTc 475 ms Personally reviewed   ASSESSMENT AND PLAN:   1) Chronic systolic HF: ICM, EF 0000000 s/p LVAD HM II implant 08/2013 for DT.   - Echo 3/18 EF 10-15% mild AI. Mod RV dysfunction. LVAD cannula OK - has significant RV failure - Stable NYHA II-early III. Volume status looks good today - Remains off all diuretics including spiro - Failed Jardiance due to volume depletion - He has not tolerated ACE-I or amlodipine or b-blocker in the past.  2) LVAD: HMII 08/2013 for DT. - VAD interrogated personally. Parameters stable. - Admitted in May 2018 with elevated LDH with bival.  - Continue aspirin and coumadin.  - Hgb stable at 12.3 - LDH 236 - DL site ok - MAPs ok. Hydrllazine recently stopped due to orthostasis. Improved  3) VT/Frequent PVVs - now s/p ICD gen change.  - Off amio due to cerebellar side effects.  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 Now on sotalol. QTc ok on ECG.  - Seen in ER over the weekend with frequent PVCs/bigmeniny and was quite symptomatic. Now much improved with demand RV pacing to overdrive PVCs - Tolerating well but concern for potential deleterious effects of long-term RV pacing. I d/w Joesph July, PA-C today in [facility]. Will continue RV pacing for now and arrange f/u in EP Clinic in net few weeks to discuss options.  4) Atrial flutter, recurrent - patient paced out of AFL in 6/22. Atrial therapies enabled  - continue sotalol (on for VT) - Continue  warfarin INR 3.2. Discussed dosing with PharmD personally. - may need to consider ablation if recurs  5) Chronic anticoagulation: INR goal 2.0-3.0.    - INR 3.2 Discussed dosing with PharmD personally. - Continue ASA 81 mg for LVAD + warfarin.   6) HTN:  - MAPs ok. Hydrllazine recently stopped due to orthostasis. Improved   7) Hypothyroidism:  - Followed by Dr Dwyane Dee. Stable.No change   8) Hyperlipidemia:  - Continue Crestor. Zetia stopped due to cost.  - No change.   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 Liver u/s ok - labs ok  13) R knee OA - long discussion about risks/benefits of R TKA in setting of VAD support - would be relatively high risk with his RV failure but if was life-limiting symptoms we would certainly support him through it.  Total time spent 35 minutes. Over half that time spent discussing above.   Glori Bickers, MD  10:01 PM

## 2021-05-01 ENCOUNTER — Ambulatory Visit (HOSPITAL_COMMUNITY): Payer: Self-pay | Admitting: Pharmacist

## 2021-05-01 ENCOUNTER — Other Ambulatory Visit: Payer: Self-pay

## 2021-05-01 ENCOUNTER — Ambulatory Visit (HOSPITAL_COMMUNITY)
Admission: RE | Admit: 2021-05-01 | Discharge: 2021-05-01 | Disposition: A | Discharge status: Home / Self Care | Payer: Medicare Other | Source: Ambulatory Visit | Attending: Internal Medicine | Admitting: Internal Medicine

## 2021-05-01 ENCOUNTER — Encounter: Payer: Self-pay | Admitting: Cardiology

## 2021-05-01 DIAGNOSIS — Z95811 Presence of heart assist device: Secondary | ICD-10-CM

## 2021-05-01 DIAGNOSIS — Z09 Encounter for follow-up examination after completed treatment for conditions other than malignant neoplasm: Secondary | ICD-10-CM | POA: Insufficient documentation

## 2021-05-01 DIAGNOSIS — Z7901 Long term (current) use of anticoagulants: Secondary | ICD-10-CM | POA: Diagnosis not present

## 2021-05-01 LAB — PROTIME-INR
INR: 2.7 — ABNORMAL HIGH (ref 0.8–1.2)
Prothrombin Time: 28.9 seconds — ABNORMAL HIGH (ref 11.4–15.2)

## 2021-05-01 LAB — LACTATE DEHYDROGENASE: LDH: 205 U/L — ABNORMAL HIGH (ref 98–192)

## 2021-05-01 NOTE — Progress Notes (Signed)
LVAD INR 

## 2021-05-15 ENCOUNTER — Encounter (HOSPITAL_COMMUNITY): Payer: Medicare Other

## 2021-05-16 ENCOUNTER — Other Ambulatory Visit (HOSPITAL_COMMUNITY): Payer: Self-pay | Admitting: *Deleted

## 2021-05-16 DIAGNOSIS — Z7901 Long term (current) use of anticoagulants: Secondary | ICD-10-CM

## 2021-05-16 DIAGNOSIS — Z95811 Presence of heart assist device: Secondary | ICD-10-CM

## 2021-05-22 ENCOUNTER — Other Ambulatory Visit: Payer: Self-pay

## 2021-05-22 ENCOUNTER — Ambulatory Visit (HOSPITAL_COMMUNITY): Payer: Self-pay | Admitting: Pharmacist

## 2021-05-22 ENCOUNTER — Ambulatory Visit (HOSPITAL_COMMUNITY)
Admission: RE | Admit: 2021-05-22 | Discharge: 2021-05-22 | Disposition: A | Discharge status: Home / Self Care | Payer: Medicare Other | Source: Ambulatory Visit | Attending: Cardiology | Admitting: Cardiology

## 2021-05-22 DIAGNOSIS — Z7901 Long term (current) use of anticoagulants: Secondary | ICD-10-CM | POA: Insufficient documentation

## 2021-05-22 DIAGNOSIS — Z95811 Presence of heart assist device: Secondary | ICD-10-CM

## 2021-05-22 LAB — LACTATE DEHYDROGENASE: LDH: 270 U/L — ABNORMAL HIGH (ref 98–192)

## 2021-05-22 LAB — PROTIME-INR
INR: 2.6 — ABNORMAL HIGH (ref 0.8–1.2)
Prothrombin Time: 27.9 seconds — ABNORMAL HIGH (ref 11.4–15.2)

## 2021-05-22 NOTE — Progress Notes (Signed)
LVAD INR 

## 2021-05-27 ENCOUNTER — Other Ambulatory Visit: Payer: Self-pay

## 2021-05-27 ENCOUNTER — Ambulatory Visit (INDEPENDENT_AMBULATORY_CARE_PROVIDER_SITE_OTHER): Payer: Medicare Other | Admitting: Student

## 2021-05-27 ENCOUNTER — Encounter: Payer: Self-pay | Admitting: Student

## 2021-05-27 VITALS — Ht 67.0 in | Wt 172.0 lb

## 2021-05-27 DIAGNOSIS — I255 Ischemic cardiomyopathy: Secondary | ICD-10-CM | POA: Diagnosis not present

## 2021-05-27 DIAGNOSIS — I472 Ventricular tachycardia, unspecified: Secondary | ICD-10-CM

## 2021-05-27 DIAGNOSIS — Z95811 Presence of heart assist device: Secondary | ICD-10-CM | POA: Diagnosis not present

## 2021-05-27 LAB — CUP PACEART INCLINIC DEVICE CHECK
Battery Remaining Longevity: 88 mo
Battery Voltage: 2.97 V
Brady Statistic AP VP Percent: 68.75 %
Brady Statistic AP VS Percent: 21.73 %
Brady Statistic AS VP Percent: 8.25 %
Brady Statistic AS VS Percent: 1.27 %
Brady Statistic RA Percent Paced: 90.11 %
Brady Statistic RV Percent Paced: 76.69 %
Date Time Interrogation Session: 20220927131122
HighPow Impedance: 38 Ohm
HighPow Impedance: 47 Ohm
Implantable Lead Implant Date: 20091103
Implantable Lead Implant Date: 20091103
Implantable Lead Location: 753859
Implantable Lead Location: 753860
Implantable Lead Model: 5076
Implantable Lead Model: 6947
Implantable Pulse Generator Implant Date: 20201028
Lead Channel Impedance Value: 361 Ohm
Lead Channel Impedance Value: 399 Ohm
Lead Channel Impedance Value: 418 Ohm
Lead Channel Pacing Threshold Amplitude: 0.5 V
Lead Channel Pacing Threshold Amplitude: 1.375 V
Lead Channel Pacing Threshold Pulse Width: 0.4 ms
Lead Channel Pacing Threshold Pulse Width: 0.4 ms
Lead Channel Sensing Intrinsic Amplitude: 1.375 mV
Lead Channel Sensing Intrinsic Amplitude: 1.75 mV
Lead Channel Sensing Intrinsic Amplitude: 7 mV
Lead Channel Sensing Intrinsic Amplitude: 8.625 mV
Lead Channel Setting Pacing Amplitude: 1.5 V
Lead Channel Setting Pacing Amplitude: 2.75 V
Lead Channel Setting Pacing Pulse Width: 0.6 ms
Lead Channel Setting Sensing Sensitivity: 0.3 mV

## 2021-05-27 NOTE — Progress Notes (Signed)
Electrophysiology Office Note Date: 05/27/2021  ID:  Johnny Oneill, Johnny Oneill 1940/11/10, MRN [id number]  PCP: Lavone Orn, MD Primary Cardiologist: None Electrophysiologist: Will Meredith Leeds, MD   CC: Routine ICD follow-up  Johnny Oneill is a 80 y.o. male seen today for Will Meredith Leeds, MD for routine electrophysiology followup.  Since last being seen in our clinic the patient reports doing well overall. He has not had any further lightheadedness. He hasn't noticed any change in symptoms overall for worse or better with changes to RV pacing. he denies chest pain, palpitations, dyspnea, PND, orthopnea, nausea, vomiting, dizziness, syncope, edema, weight gain, or early satiety. He has not had ICD shocks.   Device History: Medtronic Dual Chamber ICD implanted 2009, gen change 06/2019 History of appropriate therapy: Yes History of AAD therapy: Yes; previously stopped amiodarone due to cerebellar effects  Past Medical History:  Diagnosis Date   Anxiety    Automatic implantable cardioverter-defibrillator in situ    CAD (coronary artery disease)    S/P stenting of LCX in 2004;  s/p Lat MI 2009 with occlusion of the LCX - treated with Promus stenting. Has total occlusion of the RCA.    CHF (congestive heart failure) (HCC)    Hypercholesteremia    Hypertension    Hypothyroidism    ICD (implantable cardiac defibrillator) in place    PROPHYLACTIC      medtronic   Inferior MI ([place]) "? date"; 2009   Ischemic cardiomyopathy    a. 10/09 Echo: Sev LV Dysfxn, inf/lat AK, Mod MR.   Liver disease    LVAD (left ventricular assist device) present ([place])    Osteoarthritis    a. s/p R TKR   Pacemaker    PVC (premature ventricular contraction)    RBBB    Shortness of breath    Past Surgical History:  Procedure Laterality Date   CARPAL TUNNEL RELEASE Right 05/29/2016   Procedure: CARPAL TUNNEL RELEASE;  Surgeon: Earlie Server, MD;  Location: [place];  Service: Orthopedics;  Laterality:  Right;   CORONARY ANGIOPLASTY WITH STENT PLACEMENT  2004   CORONARY ANGIOPLASTY WITH STENT PLACEMENT  07/2008   DEFIBRILLATOR  2009   ESOPHAGOGASTRODUODENOSCOPY N/A 09/15/2013   Procedure: ESOPHAGOGASTRODUODENOSCOPY (EGD);  Surgeon: Ladene Artist, MD;  Location: [facility] ENDOSCOPY;  Service: Endoscopy;  Laterality: N/A;  bedside   HEMATOMA EVACUATION Right 11/06/2013   Procedure: EVACUATION HEMATOMA;  Surgeon: Gaye Pollack, MD;  Location: [place];  Service: Cardiothoracic;  Laterality: Right;   ICD GENERATOR CHANGEOUT N/A 06/28/2019   Procedure: ICD GENERATOR CHANGEOUT;  Surgeon: Constance Haw, MD;  Location: [place] [facility];  Service: Cardiovascular;  Laterality: N/A;   INSERT / REPLACE / REMOVE PACEMAKER  2009   INSERTION OF IMPLANTABLE LEFT VENTRICULAR ASSIST DEVICE N/A 09/18/2013   Procedure: INSERTION OF IMPLANTABLE LEFT VENTRICULAR ASSIST DEVICE;  Surgeon: Ivin Poot, MD;  Location: [place];  Service: Open Heart Surgery;  Laterality: N/A;  CIRC ARREST  NITRIC OXIDE   INTRA-AORTIC BALLOON PUMP INSERTION N/A 09/14/2013   Procedure: INTRA-AORTIC BALLOON PUMP INSERTION;  Surgeon: Jolaine Artist, MD;  Location: [facility] CATH LAB;  Service: Cardiovascular;  Laterality: N/A;   INTRAOPERATIVE TRANSESOPHAGEAL ECHOCARDIOGRAM N/A 09/18/2013   Procedure: INTRAOPERATIVE TRANSESOPHAGEAL ECHOCARDIOGRAM;  Surgeon: Ivin Poot, MD;  Location: [place];  Service: Open Heart Surgery;  Laterality: N/A;   KNEE ARTHROSCOPY     bilaterally   KNEE ARTHROSCOPY W/ PARTIAL MEDIAL MENISCECTOMY  12/2002   left  LEFT VENTRICULAR ASSIST DEVICE     RIGHT HEART CATHETERIZATION N/A 08/25/2013   Procedure: RIGHT HEART CATH;  Surgeon: Jolaine Artist, MD;  Location: [facility] CATH LAB;  Service: Cardiovascular;  Laterality: N/A;   RIGHT HEART CATHETERIZATION N/A 09/13/2013   Procedure: RIGHT HEART CATH;  Surgeon: Jolaine Artist, MD;  Location: [facility] CATH LAB;  Service: Cardiovascular;  Laterality: N/A;   RIGHT HEART  CATHETERIZATION N/A 02/08/2014   Procedure: RIGHT HEART CATH;  Surgeon: Jolaine Artist, MD;  Location: [facility] CATH LAB;  Service: Cardiovascular;  Laterality: N/A;   RIGHT HEART CATHETERIZATION N/A 02/12/2014   Procedure: RIGHT HEART CATH;  Surgeon: Jolaine Artist, MD;  Location[facility] CATH LAB;  Service: Cardiovascular;  Laterality: N/A;   TOTAL KNEE ARTHROPLASTY  11/27/11   left   TOTAL KNEE ARTHROPLASTY  11/27/2011   Procedure: TOTAL KNEE ARTHROPLASTY;  Surgeon: Yvette Rack., MD;  Location: [place];  Service: Orthopedics;  Laterality: Left;  left total knee arthroplasty   VIDEO ASSISTED THORACOSCOPY Right 11/06/2013   Procedure: VIDEO ASSISTED THORACOSCOPY;  Surgeon: Gaye Pollack, MD;  Location: [facility] OR;  Service: Cardiothoracic;  Laterality: Right;    Current Outpatient Medications  Medication Sig Dispense Refill   acetaminophen (TYLENOL) 500 MG tablet Take 500-1,000 mg by mouth every 6 (six) hours as needed (pain).     aspirin EC 81 MG tablet Take 81 mg by mouth in the morning.     cholecalciferol (VITAMIN D3) 25 MCG (1000 UT) tablet Take 1,000 Units by mouth in the morning.     citalopram (CELEXA) 10 MG tablet Take 1 tablet by mouth once daily 90 tablet 3   Coenzyme Q10 (COQ10) 200 MG CAPS Take 200 mg by mouth in the morning.     diphenhydrAMINE (SOMINEX) 25 MG tablet Take 25 mg by mouth at bedtime as needed for sleep.     famotidine (PEPCID) 20 MG tablet Take 20 mg by mouth in the morning.     furosemide (LASIX) 20 MG tablet Take 1 tablet (20 mg total) by mouth daily as needed (Take 20 mg as daily as needed for wt > 153 lbs). Reported on 01/14/2016 30 tablet 6   levothyroxine (EUTHYROX) 137 MCG tablet Take 1 tablet (137 mcg total) by mouth daily before breakfast. 90 tablet 1   pantoprazole (PROTONIX) 40 MG tablet Take 1 tablet (40 mg total) by mouth daily. 90 tablet 3   potassium chloride SA (KLOR-CON) 20 MEQ tablet Take 1 tablet (20 mEq total) by mouth daily as needed. Take one potassium  pill when you take your lasix. 30 tablet 6   Probiotic Product (PROBIOTIC PO) Take 1 capsule by mouth in the morning.     rosuvastatin (CRESTOR) 20 MG tablet Take 1 tablet (20 mg total) by mouth daily. 90 tablet 3   sotalol (BETAPACE) 80 MG tablet TAKE 1 TABLET (80 MG TOTAL) BY MOUTH DAILY. 90 tablet 3   traMADol (ULTRAM) 50 MG tablet Take 1-2 tablets (50-100 mg total) by mouth every 6 (six) hours as needed for moderate pain. 45 tablet 5   traZODone (DESYREL) 50 MG tablet Take 1 tablet (50 mg total) by mouth at bedtime. 30 tablet 5   warfarin (COUMADIN) 5 MG tablet Take 5 mg (1 tablet) every Monday/Friday and 7.5 mg (1.5 tablets) all other days OR as directed by HF Clinic 135 tablet 11   No current facility-administered medications for this visit.   Facility-Administered Medications Ordered in Other Visits  Medication Dose Route Frequency Provider Last Rate Last Admin   etomidate (AMIDATE) injection    Anesthesia Intra-op Myna Bright, CRNA   20 mg at 02/08/14 0915   rocuronium [facility]) injection    Anesthesia Intra-op Myna Bright, CRNA   50 mg at 02/08/14 0932   succinylcholine (ANECTINE) injection    Anesthesia Intra-op Myna Bright, CRNA   100 mg at 02/08/14 0915    Allergies:   Ace inhibitors, Amiodarone, Diovan [valsartan], Lipitor [atorvastatin calcium], Jardiance [empagliflozin], and Norvasc [amlodipine]   Social History: Social History   Socioeconomic History   Marital status: Married    Spouse name: Not on file   Number of children: 1   Years of education: Not on file   Highest education level: Not on file  Occupational History   Occupation: Music therapist: RETIRED   Occupation: Copywriter, advertising  Tobacco Use   Smoking status: Never   Smokeless tobacco: Never  Vaping Use   Vaping Use: Never used  Substance and Sexual Activity   Alcohol use: No   Drug use: No    Types: Marijuana    Comment: "back in our younger days we smoked a little pot"    Sexual activity: Not Currently  Other Topics Concern   Not on file  Social History Narrative   Not on file   Social Determinants of Health   Financial Resource Strain: Not on file  Food Insecurity: Not on file  Transportation Needs: Not on file  Physical Activity: Not on file  Stress: Not on file  Social Connections: Not on file  Intimate Partner Violence: Not on file    Family History: Family History  Problem Relation Age of Onset   Heart failure Father    Stroke Father    Coronary artery disease Mother    Heart disease Mother    Hyperlipidemia Sister     Review of Systems: All other systems reviewed and are otherwise negative except as noted above.   Physical Exam: Vitals:   05/27/21 1210  Weight: 172 lb (78 kg)  Height: '5\' 7"'$  (1.702 m)     GEN- The patient is well appearing, alert and oriented x 3 today.   HEENT: normocephalic, atraumatic; sclera clear, conjunctiva pink; hearing intact; oropharynx clear; neck supple, no JVP Lymph- no cervical lymphadenopathy Lungs- Clear to ausculation bilaterally, normal work of breathing.  No wheezes, rales, rhonchi Heart- Regular rate and rhythm, no murmurs, rubs or gallops, PMI not laterally displaced GI- soft, non-tender, non-distended, bowel sounds present, no hepatosplenomegaly Extremities- no clubbing or cyanosis. No edema; DP/PT/radial pulses 2+ bilaterally MS- no significant deformity or atrophy Skin- warm and dry, no rash or lesion; ICD pocket well healed Psych- euthymic mood, full affect Neuro- strength and sensation are intact  ICD interrogation- reviewed in detail today,  See PACEART report  EKG:  EKG is not ordered today.  Recent Labs: 12/01/2020: B Natriuretic Peptide 409.8 03/20/2021: ALT 18 03/28/2021: TSH 0.98 04/20/2021: BUN 27; Creatinine, Ser 2.02; Hemoglobin 12.3; Magnesium 2.2; Platelets 160; Potassium 4.2; Sodium 135   Wt Readings from Last 3 Encounters:  05/27/21 172 lb (78 kg)  04/23/21 173 lb  12.8 oz (78.8 kg)  04/01/21 171 lb (77.6 kg)     Other studies Reviewed: Additional studies/ records that were reviewed today include: Previous EP office notes.   Assessment and Plan:  1.  Chronic systolic dysfunction s/p Medtronic dual chamber ICD  euvolemic today Stable on an appropriate medical  regimen Normal ICD function See Pace Art report LRL changed to 70 (from 80) and sleep mode turned on from 2350 to 0730 with LRL of 50.  LVAD HM2 managed by CHF team He uses lasix as needed only.  2. VT  Quiescent on sotalol  3. Atrial flutter NSR today.   4. Frequent PVCs LRL recently changed to 80.  VP % went from ~2% to ~77%. After discussing with both Dr.s Camnitz and Caryl Comes, difficult to posit what effect an increase in RV pacing may have in someone with an LVAD.  I changed his LRL to 70 today which still suppressed his PVCs. I also added a sleep mode so in the night his LRL will drop to 50.  Will follow patient closely in 4 weeks to follow RV% percentage and symptoms closely.   At least 1 study shows IMPROVED outcomes in LVAD patients when comparing RV pacing to BiV pacing (in favor of RV pacing).    [address] BB, [place] JS, [place]wood T, Elenora Fender AB, Narang N, Holzhauser LH, Burkhoff D, Lang RM, [place], [place]. Biventricular Pacing Versus Right Ventricular Pacing in Patients Supported With LVAD. JACC Clin Electrophysiol. 2021 Aug;7(8):1003-1009. doi: 10.1016/j.jacep.2021.01.016. Epub 2021 Jun 30. PMID: [id number].  "In the first prospective randomized study comparing variable pacing in LVAD patients, RV pacing was associated with significantly improved functional status, quality of life, fewer ventricular tachyarrhythmias, and stable lead impedance compared with BiV pacing. This study supports turning off LV lead pacing in LVAD patients with CRT."  Current medicines are reviewed at length with the patient today.     Disposition:   Follow up with EP APP in 4 weeks.    Jacalyn Lefevre, PA-C  05/27/2021 1:05 PM  [place] [address] [phone] (office) [phone] (fax)

## 2021-06-06 ENCOUNTER — Other Ambulatory Visit (HOSPITAL_COMMUNITY): Payer: Self-pay | Admitting: *Deleted

## 2021-06-06 DIAGNOSIS — I472 Ventricular tachycardia, unspecified: Secondary | ICD-10-CM

## 2021-06-06 DIAGNOSIS — Z95811 Presence of heart assist device: Secondary | ICD-10-CM

## 2021-06-06 DIAGNOSIS — Z7901 Long term (current) use of anticoagulants: Secondary | ICD-10-CM

## 2021-06-12 ENCOUNTER — Other Ambulatory Visit: Payer: Self-pay

## 2021-06-12 ENCOUNTER — Ambulatory Visit (HOSPITAL_COMMUNITY)
Admission: RE | Admit: 2021-06-12 | Discharge: 2021-06-12 | Disposition: A | Discharge status: Home / Self Care | Payer: Medicare Other | Source: Ambulatory Visit | Attending: Internal Medicine | Admitting: Internal Medicine

## 2021-06-12 ENCOUNTER — Ambulatory Visit (HOSPITAL_COMMUNITY): Payer: Self-pay | Admitting: Pharmacist

## 2021-06-12 DIAGNOSIS — Z95811 Presence of heart assist device: Secondary | ICD-10-CM | POA: Diagnosis not present

## 2021-06-12 DIAGNOSIS — Z7901 Long term (current) use of anticoagulants: Secondary | ICD-10-CM | POA: Insufficient documentation

## 2021-06-12 LAB — PROTIME-INR
INR: 2.8 — ABNORMAL HIGH (ref 0.8–1.2)
Prothrombin Time: 29.9 seconds — ABNORMAL HIGH (ref 11.4–15.2)

## 2021-06-12 LAB — LACTATE DEHYDROGENASE: LDH: 266 U/L — ABNORMAL HIGH (ref 98–192)

## 2021-06-12 NOTE — Progress Notes (Signed)
LVAD INR 

## 2021-06-16 ENCOUNTER — Other Ambulatory Visit: Payer: Self-pay

## 2021-06-16 ENCOUNTER — Emergency Department (HOSPITAL_COMMUNITY)
Admission: EM | Admit: 2021-06-16 | Discharge: 2021-06-16 | Disposition: A | Discharge status: Home / Self Care | Payer: Medicare Other | Attending: Emergency Medicine | Admitting: Emergency Medicine

## 2021-06-16 ENCOUNTER — Encounter (HOSPITAL_COMMUNITY): Payer: Self-pay

## 2021-06-16 DIAGNOSIS — E039 Hypothyroidism, unspecified: Secondary | ICD-10-CM | POA: Insufficient documentation

## 2021-06-16 DIAGNOSIS — Z7901 Long term (current) use of anticoagulants: Secondary | ICD-10-CM

## 2021-06-16 DIAGNOSIS — T82897A Other specified complication of cardiac prosthetic devices, implants and grafts, initial encounter: Secondary | ICD-10-CM | POA: Diagnosis not present

## 2021-06-16 DIAGNOSIS — Z96652 Presence of left artificial knee joint: Secondary | ICD-10-CM | POA: Insufficient documentation

## 2021-06-16 DIAGNOSIS — R42 Dizziness and giddiness: Secondary | ICD-10-CM | POA: Insufficient documentation

## 2021-06-16 DIAGNOSIS — Z95811 Presence of heart assist device: Secondary | ICD-10-CM

## 2021-06-16 DIAGNOSIS — Z79899 Other long term (current) drug therapy: Secondary | ICD-10-CM | POA: Diagnosis not present

## 2021-06-16 DIAGNOSIS — I472 Ventricular tachycardia, unspecified: Secondary | ICD-10-CM | POA: Diagnosis not present

## 2021-06-16 DIAGNOSIS — R000 Tachycardia, unspecified: Secondary | ICD-10-CM

## 2021-06-16 DIAGNOSIS — I2510 Atherosclerotic heart disease of native coronary artery without angina pectoris: Secondary | ICD-10-CM | POA: Insufficient documentation

## 2021-06-16 DIAGNOSIS — Z950 Presence of cardiac pacemaker: Secondary | ICD-10-CM | POA: Insufficient documentation

## 2021-06-16 DIAGNOSIS — Z7982 Long term (current) use of aspirin: Secondary | ICD-10-CM | POA: Diagnosis not present

## 2021-06-16 DIAGNOSIS — R531 Weakness: Secondary | ICD-10-CM

## 2021-06-16 DIAGNOSIS — I110 Hypertensive heart disease with heart failure: Secondary | ICD-10-CM | POA: Insufficient documentation

## 2021-06-16 DIAGNOSIS — N183 Chronic kidney disease, stage 3 unspecified: Secondary | ICD-10-CM

## 2021-06-16 DIAGNOSIS — I5022 Chronic systolic (congestive) heart failure: Secondary | ICD-10-CM | POA: Diagnosis not present

## 2021-06-16 LAB — BASIC METABOLIC PANEL
Anion gap: 9 (ref 5–15)
BUN: 35 mg/dL — ABNORMAL HIGH (ref 8–23)
CO2: 23 mmol/L (ref 22–32)
Calcium: 9.2 mg/dL (ref 8.9–10.3)
Chloride: 100 mmol/L (ref 98–111)
Creatinine, Ser: 2.24 mg/dL — ABNORMAL HIGH (ref 0.61–1.24)
GFR, Estimated: 29 mL/min — ABNORMAL LOW (ref >60)
Glucose, Bld: 143 mg/dL — ABNORMAL HIGH (ref 70–99)
Potassium: 4 mmol/L (ref 3.5–5.1)
Sodium: 132 mmol/L — ABNORMAL LOW (ref 135–145)

## 2021-06-16 LAB — CBC WITH DIFFERENTIAL/PLATELET
Abs Immature Granulocytes: 0.04 10*3/uL (ref 0.00–0.07)
Basophils Absolute: 0.1 10*3/uL (ref 0.0–0.1)
Basophils Relative: 1 %
Eosinophils Absolute: 0.4 10*3/uL (ref 0.0–0.5)
Eosinophils Relative: 5 %
HCT: 40.1 % (ref 39.0–52.0)
Hemoglobin: 13.1 g/dL (ref 13.0–17.0)
Immature Granulocytes: 1 %
Lymphocytes Relative: 29 %
Lymphs Abs: 2.1 10*3/uL (ref 0.7–4.0)
MCH: 30.4 pg (ref 26.0–34.0)
MCHC: 32.7 g/dL (ref 30.0–36.0)
MCV: 93 fL (ref 80.0–100.0)
Monocytes Absolute: 0.7 10*3/uL (ref 0.1–1.0)
Monocytes Relative: 9 %
Neutro Abs: 4.2 10*3/uL (ref 1.7–7.7)
Neutrophils Relative %: 55 %
Platelets: 170 10*3/uL (ref 150–400)
RBC: 4.31 MIL/uL (ref 4.22–5.81)
RDW: 15.5 % (ref 11.5–15.5)
WBC: 7.5 10*3/uL (ref 4.0–10.5)
nRBC: 0 % (ref 0.0–0.2)

## 2021-06-16 LAB — PROTIME-INR
INR: 3.2 — ABNORMAL HIGH (ref 0.8–1.2)
Prothrombin Time: 32.7 seconds — ABNORMAL HIGH (ref 11.4–15.2)

## 2021-06-16 LAB — LACTATE DEHYDROGENASE: LDH: 294 U/L — ABNORMAL HIGH (ref 98–192)

## 2021-06-16 MED ORDER — AMIODARONE IV BOLUS ONLY 150 MG/100ML
150.0000 mg | Freq: Once | INTRAVENOUS | Status: AC
  Administered 2021-06-16: 150 mg via INTRAVENOUS
  Filled 2021-06-16: qty 100

## 2021-06-16 MED ORDER — SODIUM CHLORIDE 0.9 % IV BOLUS
500.0000 mL | Freq: Once | INTRAVENOUS | Status: AC
  Administered 2021-06-16: 500 mL via INTRAVENOUS

## 2021-06-16 MED ORDER — SODIUM CHLORIDE 0.9 % IV SOLN: INTRAVENOUS | Status: DC

## 2021-06-16 MED ORDER — PROPOFOL 10 MG/ML IV BOLUS
INTRAVENOUS | Status: DC | PRN
  Administered 2021-06-16: 20 ug via INTRAVENOUS

## 2021-06-16 MED ORDER — PROPOFOL 10 MG/ML IV BOLUS
0.5000 mg/kg | Freq: Once | INTRAVENOUS | Status: AC
  Administered 2021-06-16: 50 mg via INTRAVENOUS
  Filled 2021-06-16: qty 20

## 2021-06-16 NOTE — Progress Notes (Signed)
LVAD Coordinator ED Encounter  Johnny Oneill a 80 y.o. male that presented to [facility] ER today due to low flow alarms on LVAD. He has a past medical history  has a past medical history of Anxiety, Automatic implantable cardioverter-defibrillator in situ, CAD (coronary artery disease), CHF (congestive heart failure) ([place]), Hypercholesteremia, Hypertension, Hypothyroidism, ICD (implantable cardiac defibrillator) in place, Inferior MI ([place]) ("? date"; 2009), Ischemic cardiomyopathy, Liver disease, LVAD (left ventricular assist device) present ([place]), Osteoarthritis, Pacemaker, PVC (premature ventricular contraction), RBBB, and Shortness of breath.[name]   LVAD is a HM II  and was implanted on 09/18/13 by Johnny Oneill.   Received page from patient's wife yesterday evening stating pt's flow was 3.3 and PI was in the 3s. Pt asymptomatic at that time. History of afib/VT causing VAD number changes. Plan was made to bring patient to VAD clinic Monday morning at 10 to interrogate VAD and ICD. Instructed them to page VAD coordinator if VAD alarms occurred, or he began to not feel well.   Received page from patient's wife at 0400 this morning stating he had had a LOW FLOW alarm. Pt remained asymptomatic. Flow 3.3 PI 4.5. Instructed at that time to drink a few bottles of water, and to page VAD coordinator if further alarms occurred.   Received page at 0440 stating pt was having repeated LOW FLOW alarms. Remained asymptomatic. Advised to go to the ER for assessment. Pt's wife called 78 and pt arrived via EMS.   Spoke with ER MD at 0550- pt in VT rate 170. Asymptomatic, hemodynamically stable. I provided him with Johnny Oneill's contact information to discuss pt's plan.  Per Johnny Oneill- give 500 cc IVF and Amiodarone 150 mg IV bolus x 1. Will plan to cardiovert upon heart failure team arrival to hospital. Pt remains asymptomatic.   Upon my arrival to ER pt having intermittent brief LOW FLOW alarms. BP stable. ICD  interrogation performed. DCCV performed at 0756 by Johnny Oneill and Johnny Oneill. See separate note for documentation. Received Neo push post DCCV for BP management- see separate note for documentation.   Post cardioversion pt with continuous asymptomatic LOW FLOW alarms. (Flow 2.3 - 2.6.)  ICD settings per Johnny Oneill and [name] PA- pacing at 3. Pt received a total of 2 L IVF. VAD speed initially decreased to 9000, then increased to 9600.   Will observe in ER for 60 minutes. If LOW FLOWS do not resolve will need admission.  Vital signs: HR: 170 VT  Doppler MAP: 80 Automated BP: 134/98 (108) O2 Sat: 95% on RA  LVAD interrogation reveals:  Speed: 9400 Flow: 2.6 (---) Power: 4.4 w PI: 3.9  Alarms: 30 LOW FLOW alarms since 0215  06/14/21- 6 LOW FLOWS   Events: 10/13 - 10/14- 100 + PI events each day  Drive Line: Maintained weekly by patient's wife.   Significant Events with LVAD: - 06/27/19 with syncopal episode and head injury from fall. ECG showed VT @ 240. Underwent emergent DC-CV in ER. Head CT with no bleed. Was admitted and had ICD gen replacement (EOL). Had small hematoma at site. Amio started. Post-discharge found to have episode of AFL on ICD remote check but has since resolved.  - Had ICD shock for VT on 06/09/20. Started on sotalol  - VT 04/20/21. Had frequent PVCs and with lightheadedness. Given fluid bolus. Pacemaker adjusted.    Updated VAD Providers (Johnny Oneill) about the above.  Able to independently manage LVAD equipment.    Johnny Hail  Altus Zaino RN [place] Coordinator  Office: [phone]  24/7 Pager: [phone]

## 2021-06-16 NOTE — ED Notes (Signed)
Cardioverted at 200g b/p 84/66

## 2021-06-16 NOTE — ED Provider Notes (Signed)
[place] EMERGENCY DEPARTMENT Provider Note   CSN: [id number] Arrival date & time: 06/16/21  0522     History CC LVAD problem   Johnny Oneill is a 80 y.o. male with a hx of CHF w/ LVAD present, CAD S/p PCI, hypertension, hypercholesterolemia, & chronic anticoagulation on warfarin who presents to the ED with chief complaint of LVAD alarm tonight. Patient states that he went to bed feeling normal and woke up to his LVAD alarm going off for low flow, subsequently started he is able to go back to sleep but then woke up about an hour later with recurrence of the alarm.  He states that he then got a message flashing to call the hospital, he was directed to come to the emergency department.  He currently does not have any significant symptoms, he denies any current chest pain, shortness of breath, dizziness, lightheadedness, or recent syncope.  No alleviating or aggravating factors.  He does note that he was intermittently lightheaded over the past 2 days with position changes, no syncope occurred, no lightheadedness at present.    HPI     Past Medical History:  Diagnosis Date   Anxiety    Automatic implantable cardioverter-defibrillator in situ    CAD (coronary artery disease)    S/P stenting of LCX in 2004;  s/p Lat MI 2009 with occlusion of the LCX - treated with Promus stenting. Has total occlusion of the RCA.    CHF (congestive heart failure) (HCC)    Hypercholesteremia    Hypertension    Hypothyroidism    ICD (implantable cardiac defibrillator) in place    PROPHYLACTIC      medtronic   Inferior MI ([place]) "? date"; 2009   Ischemic cardiomyopathy    a. 10/09 Echo: Sev LV Dysfxn, inf/lat AK, Mod MR.   Liver disease    LVAD (left ventricular assist device) present ([place])    Osteoarthritis    a. s/p R TKR   Pacemaker    PVC (premature ventricular contraction)    RBBB    Shortness of breath     Patient Active Problem List   Diagnosis Date Noted   Chronic  systolic CHF (congestive heart failure) ([place]) 01/02/2015   Decreased appetite 11/01/2014   Contact dermatitis 09/11/2014   Inguinal hernia 08/21/2014   Hemothorax 02/08/2014   HTN (hypertension) 01/30/2014   Chronic anticoagulation 01/08/2014   Hypothyroid 12/20/2013   Syncope 11/28/2013   Hemothorax on right 11/06/2013   Poor venous access 11/02/2013   LVAD (left ventricular assist device) present ([place]) 09/18/2013   Weakness generalized 09/15/2013   Nonspecific (abnormal) findings on radiological and other examination of gastrointestinal tract 09/14/2013   Anemia, unspecified 09/14/2013   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   PVC (premature ventricular contraction) 12/08/2012   VT (ventricular tachycardia) 12/08/2012   Essential hypertension, benign 11/12/2010   ISCHEMIC CARDIOMYOPATHY [id number]   Chronic systolic heart failure ([place]) 11/12/2010   Ischemic cardiomyopathy    Myocardial infarction of lateral wall (HCC)    Hypercholesteremia    Automatic implantable cardioverter-defibrillator in situ    CAD (coronary artery disease)    Osteoarthritis     Past Surgical History:  Procedure Laterality Date   CARPAL TUNNEL RELEASE Right 05/29/2016   Procedure: CARPAL TUNNEL RELEASE;  Surgeon: Earlie Server, MD;  Location: [place];  Service: Orthopedics;  Laterality: Right;   CORONARY ANGIOPLASTY WITH STENT PLACEMENT  2004   CORONARY ANGIOPLASTY WITH STENT PLACEMENT  07/2008   DEFIBRILLATOR  2009  ESOPHAGOGASTRODUODENOSCOPY N/A 09/15/2013   Procedure: ESOPHAGOGASTRODUODENOSCOPY (EGD);  Surgeon: Ladene Artist, MD;  Location: [facility] ENDOSCOPY;  Service: Endoscopy;  Laterality: N/A;  bedside   HEMATOMA EVACUATION Right 11/06/2013   Procedure: EVACUATION HEMATOMA;  Surgeon: Gaye Pollack, MD;  Location: [place];  Service: Cardiothoracic;  Laterality: Right;   ICD GENERATOR CHANGEOUT N/A 06/28/2019   Procedure: ICD GENERATOR CHANGEOUT;  Surgeon: Constance Haw, MD;  Location: [place] CV LAB;  Service: Cardiovascular;  Laterality: N/A;   INSERT / REPLACE / REMOVE PACEMAKER  2009   INSERTION OF IMPLANTABLE LEFT VENTRICULAR ASSIST DEVICE N/A 09/18/2013   Procedure: INSERTION OF IMPLANTABLE LEFT VENTRICULAR ASSIST DEVICE;  Surgeon: Ivin Poot, MD;  Location: [place];  Service: Open Heart Surgery;  Laterality: N/A;  CIRC ARREST  NITRIC OXIDE   INTRA-AORTIC BALLOON PUMP INSERTION N/A 09/14/2013   Procedure: INTRA-AORTIC BALLOON PUMP INSERTION;  Surgeon: Jolaine Artist, MD;  Location[facility] C[facility] LAB;  Service: Cardiovascular;  Laterality: N/A;   INTRAOPERATIVE TRANSESOPHAGEAL ECHOCARDIOGRAM N/A 09/18/2013   Procedure: INTRAOPERATIVE TRANSESOPHAGEAL ECHOCARDIOGRAM;  Surgeon: Ivin Poot, MD;  Location: [place];  Service: Open Heart Surgery;  Laterality: N/A;   KNEE ARTHROSCOPY     bilaterally   KNEE ARTHROSCOPY W/ PARTIAL MEDIAL MENISCECTOMY  12/2002   left   LEFT VENTRICULAR ASSIST DEVICE     RIGHT HEART CATHETERIZATION N/A 08/25/2013   Procedure: RIGHT HEART CATH;  Surgeon: Jolaine Artist, MD;  Location: [facility] CATH LAB;  Service: Cardiovascular;  Laterality: N/A;   RIGHT HEART CATHETERIZATION N/A 09/13/2013   Procedure: RIGHT HEART CATH;  Surgeon: Jolaine Artist, MD;  Location: [facility] CATH LAB;  Service: Cardiovascular;  Laterality: N/A;   RIGHT HEART CATHETERIZATION N/A 02/08/2014   Procedure: RIGHT HEART CATH;  Surgeon: Jolaine Artist, MD;  Location[facility] CATH LAB;  Service: Cardiovascular;  Laterality: N/A;   RIGHT HEART CATHETERIZATION N/A 02/12/2014   Procedure: RIGHT HEART CATH;  Surgeon: Jolaine Artist, MD;  Location: [facility] CATH LAB;  Service: Cardiovascular;  Laterality: N/A;   TOTAL KNEE ARTHROPLASTY  11/27/11   left   TOTAL KNEE ARTHROPLASTY  11/27/2011   Procedure: TOTAL KNEE ARTHROPLASTY;  Surgeon: Yvette Rack., MD;  Location: [place];  Service: Orthopedics;  Laterality: Left;  left total knee arthroplasty   VIDEO ASSISTED THORACOSCOPY Right 11/06/2013    Procedure: VIDEO ASSISTED THORACOSCOPY;  Surgeon: Gaye Pollack, MD;  Location: [facility] OR;  Service: Cardiothoracic;  Laterality: Right;       Family History  Problem Relation Age of Onset   Heart failure Father    Stroke Father    Coronary artery disease Mother    Heart disease Mother    Hyperlipidemia Sister     Social History   Tobacco Use   Smoking status: Never   Smokeless tobacco: Never  Vaping Use   Vaping Use: Never used  Substance Use Topics   Alcohol use: No   Drug use: No    Types: Marijuana    Comment: "back in our younger days we smoked a little pot"    Home Medications Prior to Admission medications   Medication Sig Start Date End Date Taking? Authorizing Provider  acetaminophen (TYLENOL) 500 MG tablet Take 500-1,000 mg by mouth every 6 (six) hours as needed (pain).    [provider]  aspirin EC 81 MG tablet Take 81 mg by mouth in the morning.    [provider]  cholecalciferol (VITAMIN D3)  25 MCG (1000 UT) tablet Take 1,000 Units by mouth in the morning.    [provider]  citalopram (CELEXA) 10 MG tablet Take 1 tablet by mouth once daily 03/19/21   Bensimhon, Shaune Pascal, MD  Coenzyme Q10 (COQ10) 200 MG CAPS Take 200 mg by mouth in the morning.    [provider]  diphenhydrAMINE (SOMINEX) 25 MG tablet Take 25 mg by mouth at bedtime as needed for sleep.    [provider]  famotidine (PEPCID) 20 MG tablet Take 20 mg by mouth in the morning.    [provider]  furosemide (LASIX) 20 MG tablet Take 1 tablet (20 mg total) by mouth daily as needed (Take 20 mg as daily as needed for wt > 153 lbs). Reported on 01/14/2016 02/21/21   Shirley Friar, PA-C  levothyroxine (EUTHYROX) 137 MCG tablet Take 1 tablet (137 mcg total) by mouth daily before breakfast. 02/06/21   Elayne Snare, MD  pantoprazole (PROTONIX) 40 MG tablet Take 1 tablet (40 mg total) by mouth daily. 08/22/20   Bensimhon, Shaune Pascal, MD  potassium  chloride SA (KLOR-CON) 20 MEQ tablet Take 1 tablet (20 mEq total) by mouth daily as needed. Take one potassium pill when you take your lasix. 02/21/21   Shirley Friar, PA-C  Probiotic Product (PROBIOTIC PO) Take 1 capsule by mouth in the morning.    [provider]  rosuvastatin (CRESTOR) 20 MG tablet Take 1 tablet (20 mg total) by mouth daily. 11/26/20   Bensimhon, Shaune Pascal, MD  sotalol (BETAPACE) 80 MG tablet TAKE 1 TABLET (80 MG TOTAL) BY MOUTH DAILY. 07/10/20 07/10/21  Bensimhon, Shaune Pascal, MD  traMADol (ULTRAM) 50 MG tablet Take 1-2 tablets (50-100 mg total) by mouth every 6 (six) hours as needed for moderate pain. 04/23/21   Bensimhon, Shaune Pascal, MD  traZODone (DESYREL) 50 MG tablet Take 1 tablet (50 mg total) by mouth at bedtime. 12/31/20   Bensimhon, Shaune Pascal, MD  warfarin (COUMADIN) 5 MG tablet Take 5 mg (1 tablet) every Monday/Friday and 7.5 mg (1.5 tablets) all other days OR as directed by HF Clinic 03/20/21   Larey Dresser, MD    Allergies    Ace inhibitors, Amiodarone, Diovan [valsartan], Lipitor [atorvastatin calcium], Jardiance [empagliflozin], and Norvasc [amlodipine]  Review of Systems   Review of Systems  Constitutional:  Negative for chills and fever.  Respiratory:  Negative for shortness of breath.   Cardiovascular:  Negative for chest pain.  Gastrointestinal:  Negative for diarrhea and vomiting.  Neurological:  Positive for light-headedness (none at present). Negative for syncope.  All other systems reviewed and are negative.  Physical Exam Updated Vital Signs BP (!) 133/118 (BP Location: Right Arm)   Pulse (!) 169   Temp (!) 96.5 F (35.8 C) (Temporal)   Resp (!) 23   SpO2 94%   Physical Exam Vitals reviewed.  Constitutional:      General: He is not in acute distress. HENT:     Head: Normocephalic and atraumatic.  Eyes:     Pupils: Pupils are equal, round, and reactive to light.  Cardiovascular:     Rate and Rhythm: Tachycardia present.      Comments: LVAD hum present.  Pulmonary:     Effort: Pulmonary effort is normal. No respiratory distress.     Breath sounds: Normal breath sounds. No wheezing, rhonchi or rales.  Abdominal:     General: There is no distension.     Palpations: Abdomen is  soft.     Tenderness: There is no abdominal tenderness.     Comments: LVAD device/wires present.   Musculoskeletal:     Cervical back: Neck supple.     Right lower leg: No edema.     Left lower leg: No edema.  Skin:    General: Skin is warm and dry.  Neurological:     Mental Status: He is alert.     Comments: Clear speech, moving all extremities.     ED Results / Procedures / Treatments   Labs (all labs ordered are listed, but only abnormal results are displayed) Labs Reviewed  BASIC METABOLIC PANEL  CBC WITH DIFFERENTIAL/PLATELET  LACTATE DEHYDROGENASE  PROTIME-INR    EKG None  Radiology No results found.  Procedures Procedures   Medications Ordered in ED Medications - No data to display  ED Course  I have reviewed the triage vital signs and the nursing notes.  Pertinent labs & imaging results that were available during my care of the patient were reviewed by me and considered in my medical decision making (see chart for details).    MDM Rules/Calculators/A&P                           Patient presents to the ED due to LVAD alarm.  Nontoxic appearing, no significant symptomatic complaints at this time.  Noted to be in [place]. LVAD team labs ordered with consult placed.   05:49: CONSULT: ED attending Dr. Sedonia Small spoke with cardiologist Dr. Haroldine Laws- recommends small fluid bolus and amiodarone, LVAD coordinator coming to see patient, plan for cardioversion- Dr Haroldine Laws assessment this AM around seven.   06:00: Cardiac nurses @ bedside assisting with LVAD.   Additional history obtained:  Additional history obtained from chart review & nursing note review.   Lab Tests:  I Ordered, reviewed, and interpreted  labs, which included:  CBC: Unremarkable.  PT/INR: mildly elevated compared to prior @ 32.7/3.2 today, on coumadin BMP: Creatinine mildly increased compared to prior.  LDH: fairly similar to prior, mildly elevated comparatively.   06:30: Patient care signed out to AM ED team @ change of shift pending heart failure team assessment & disposition.   This is a shared visit with supervising physician Dr. Sedonia Small who has independently evaluated patient & provided guidance, in agreement.   Portions of this note were generated with [employer]. Dictation errors may occur despite best attempts at proofreading.  Final Clinical Impression(s) / ED Diagnoses Final diagnoses:  None    Rx / DC Orders ED Discharge Orders     None        Amaryllis Dyke, PA-C 06/16/21 [id number]    Maudie Flakes, MD 06/16/21 [phone]

## 2021-06-16 NOTE — Progress Notes (Signed)
LVAD Coordinator ED Encounter  LVAD is a HM II and was implanted on 09/18/13 by Dr. Darcey [name]. Pt is currently a [facility] VAD patient.  Wife at bedside. Pt and wife deny any Low Flow alarms over last 30 mins. Pt is voiding large amounts clear straw urine.   Orthostatics obtained (below) and patient remains asymptomatic.    Vital signs:    Lying:   Standing: HR:      88   84   Automated BP:   130/113 (120)  126/115 (121)  LVAD interrogation reveals:  Speed:     9600   9600 Flow:      2.7   3.2 Power:      4.6w   4.8w PI:      5.0   5.7   Updated Dr. Haroldine Laws about the above; ok for patient to go home. Updated pt, wife, and Dr. Francia Greaves.  Instructed pt/wife that if any issues with low flow or dizziness occur to call VAD pager. Both verbalized understanding of same.   Zada Girt, RN VAD Coordinator   Office: [phone] 24/7 Emergency VAD Pager: [phone]

## 2021-06-16 NOTE — ED Notes (Signed)
Provider at bedside

## 2021-06-16 NOTE — Discharge Instructions (Addendum)
Return for any problem.  ?

## 2021-06-16 NOTE — ED Notes (Signed)
Floor nurse at bedside to transfer pt to our batteries and monitor

## 2021-06-16 NOTE — Consult Note (Addendum)
Advanced Heart Failure Team Consult Note   Primary Physician: Lavone Orn, MD PCP-Cardiologist:  None  Reason for Consultation: LVAD   HPI:    Johnny Oneill is seen today for evaluation of VT/LVAD at the request of Dr Francia Greaves.  KEETON GARGES is a 80 y.o. 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   Presented to ED on 06/27/19 with syncopal episode and head injury from fall. ECG showed VT @ 240. Underwent emergent DC-CV in ER. Head CT with no bleed. Was admitted and had ICD gen replacement (EOL). Had small hematoma at site. Amio started. Post-discharge found to have episode of AFL on ICD remote check but has since resolved.   Intolerant amio due to tremors\ and visual disturbance,.    Had ICD shock for VT on 06/09/20. Started on sotalol    Had VT 04/20/21. Had frequent PVCs and with lightheadedness. Given fluid bolus. Pacemaker adjusted.    Yesterday had multiple low flows on LVAD. He was able to drink extra fluids. Having intermittent lightheadedness.  SOB with exertion for the last 2 days. Over night developed low flows with alarms. Had alarm to report to the hospital. Continued on sotalol. Taking all medications.   He had follow up with EP on 05/27/21 with adjustments to ICD/CRT-D.   He presented to ED this morning with VT. Given amio 150 mg IV x 1 and 500 cc NS. K 4, INR 3.2, WBC 7.5, hgb 13, LDH 294, and creatinine 2.2. Remained in VT.    LVAD Interrogation HM II: Speed: 9400 Flow: 2.6 PI: 3.9  Power: 4.4  23 low flow PI events over the last 24 hours.   Review of Systems: [y] = yes, '[ ]'$  = no   General: Weight gain '[ ]'$ ; Weight loss '[ ]'$ ; Anorexia '[ ]'$ ; Fatigue [ Y]; Fever '[ ]'$ ; Chills '[ ]'$ ; Weakness '[ ]'$   Cardiac: Chest pain/pressure '[ ]'$ ; Resting SOB '[ ]'$ ; Exertional SOB [ Y]; Orthopnea '[ ]'$ ; Pedal Edema '[ ]'$ ; Palpitations '[ ]'$ ; Syncope '[ ]'$ ; Presyncope [ Y]; Paroxysmal nocturnal  dyspnea'[ ]'$   Pulmonary: Cough '[ ]'$ ; Wheezing'[ ]'$ ; Hemoptysis'[ ]'$ ; Sputum '[ ]'$ ; Snoring '[ ]'$   GI: Vomiting'[ ]'$ ; Dysphagia'[ ]'$ ; Melena'[ ]'$ ; Hematochezia '[ ]'$ ; Heartburn'[ ]'$ ; Abdominal pain '[ ]'$ ; Constipation '[ ]'$ ; Diarrhea '[ ]'$ ; BRBPR '[ ]'$   GU: Hematuria'[ ]'$ ; Dysuria '[ ]'$ ; Nocturia'[ ]'$   Vascular: Pain in legs with walking '[ ]'$ ; Pain in feet with lying flat '[ ]'$ ; Non-healing sores '[ ]'$ ; Stroke '[ ]'$ ; TIA '[ ]'$ ; Slurred speech '[ ]'$ ;  Neuro: Headaches'[ ]'$ ; Vertigo'[ ]'$ ; Seizures'[ ]'$ ; Paresthesias'[ ]'$ ;Blurred vision '[ ]'$ ; Diplopia '[ ]'$ ; Vision changes '[ ]'$   Ortho/Skin: Arthritis '[ ]'$ ; Joint pain [ Y]; Muscle pain '[ ]'$ ; Joint swelling '[ ]'$ ; Back Pain [Y ]; Rash '[ ]'$   Psych: Depression'[ ]'$ ; Anxiety'[ ]'$   Heme: Bleeding problems '[ ]'$ ; Clotting disorders '[ ]'$ ; Anemia '[ ]'$   Endocrine: Diabetes '[ ]'$ ; Thyroid dysfunction[ Y]  Home Medications Prior to Admission medications   Medication Sig Start Date End Date Taking? Authorizing Provider  acetaminophen (TYLENOL) 500 MG tablet Take 500-1,000 mg by mouth every 6 (six) hours as needed (pain).   Yes [provider]  aspirin EC 81 MG tablet Take 81 mg by mouth in the morning.   Yes [provider]  cholecalciferol (VITAMIN D3) 25 MCG (1000 UT) tablet  Take 1,000 Units by mouth in the morning.   Yes [provider]  citalopram (CELEXA) 10 MG tablet Take 1 tablet by mouth once daily Patient taking differently: Take 10 mg by mouth daily. 03/19/21  Yes Jonathin Heinicke, Shaune Pascal, MD  Coenzyme Q10 (COQ10) 200 MG CAPS Take 200 mg by mouth in the morning.   Yes [provider]  diphenhydrAMINE (SOMINEX) 25 MG tablet Take 25 mg by mouth at bedtime as needed for sleep.   Yes [provider]  doxylamine, Sleep, (UNISOM) 25 MG tablet Take 25 mg by mouth at bedtime as needed for sleep.   Yes [provider]  famotidine (PEPCID) 20 MG tablet Take 20 mg by mouth in the morning.   Yes [provider]  furosemide (LASIX) 20 MG tablet Take 1 tablet (20 mg total) by  mouth daily as needed (Take 20 mg as daily as needed for wt > 153 lbs). Reported on 01/14/2016 Patient taking differently: Take 20 mg by mouth daily as needed (Take 20 mg as daily as needed for wt > 153 lbs). 02/21/21  Yes Shirley Friar, PA-C  levothyroxine (EUTHYROX) 137 MCG tablet Take 1 tablet (137 mcg total) by mouth daily before breakfast. 02/06/21  Yes Elayne Snare, MD  melatonin 5 MG TABS Take 5 mg by mouth at bedtime as needed (sleep).   Yes [provider]  pantoprazole (PROTONIX) 40 MG tablet Take 1 tablet (40 mg total) by mouth daily. 08/22/20  Yes Setsuko Robins, Shaune Pascal, MD  potassium chloride SA (KLOR-CON) 20 MEQ tablet Take 1 tablet (20 mEq total) by mouth daily as needed. Take one potassium pill when you take your lasix. Patient taking differently: Take 20 mEq by mouth daily as needed (when taking furosemide for weight gain). 02/21/21  Yes Shirley Friar, PA-C  Probiotic Product (PROBIOTIC PO) Take 1 capsule by mouth in the morning.   Yes [provider]  rosuvastatin (CRESTOR) 20 MG tablet Take 1 tablet (20 mg total) by mouth daily. 11/26/20  Yes Shemeika Starzyk, Shaune Pascal, MD  sotalol (BETAPACE) 80 MG tablet TAKE 1 TABLET (80 MG TOTAL) BY MOUTH DAILY. Patient taking differently: Take 80 mg by mouth daily. 07/10/20 07/10/21 Yes Andromeda Poppen, Shaune Pascal, MD  traMADol (ULTRAM) 50 MG tablet Take 1-2 tablets (50-100 mg total) by mouth every 6 (six) hours as needed for moderate pain. 04/23/21  Yes Phiona Ramnauth, Shaune Pascal, MD  traZODone (DESYREL) 50 MG tablet Take 1 tablet (50 mg total) by mouth at bedtime. Patient taking differently: Take 50 mg by mouth at bedtime as needed for sleep. 12/31/20  Yes Noralyn Karim, Shaune Pascal, MD  warfarin (COUMADIN) 5 MG tablet Take 5 mg (1 tablet) every Monday/Friday and 7.5 mg (1.5 tablets) all other days OR as directed by HF Clinic Patient taking differently: Take 5-7.5 mg by mouth See admin instructions. Take 5 mg on Monday and Friday 7.5 mg on  Tuesday,Wednesday,Thursday,Saturday and sunday 03/20/21  Yes Larey Dresser, MD    Past Medical History: Past Medical History:  Diagnosis Date   Anxiety    Automatic implantable cardioverter-defibrillator in situ    CAD (coronary artery disease)    S/P stenting of LCX in 2004;  s/p Lat MI 2009 with occlusion of the LCX - treated with Promus stenting. Has total occlusion of the RCA.    CHF (congestive heart failure) (HCC)    Hypercholesteremia    Hypertension    Hypothyroidism    ICD (implantable cardiac defibrillator) in place  PROPHYLACTIC      medtronic   Inferior MI ([place]) "? date"; 2009   Ischemic cardiomyopathy    a. 10/09 Echo: Sev LV Dysfxn, inf/lat AK, Mod MR.   Liver disease    LVAD (left ventricular assist device) present ([facility])    Osteoarthritis    a. s/p R TKR   Pacemaker    PVC (premature ventricular contraction)    RBBB    Shortness of breath     Past Surgical History: Past Surgical History:  Procedure Laterality Date   CARPAL TUNNEL RELEASE Right 05/29/2016   Procedure: CARPAL TUNNEL RELEASE;  Surgeon: Earlie Server, MD;  Location: [place];  Service: Orthopedics;  Laterality: Right;   CORONARY ANGIOPLASTY WITH STENT PLACEMENT  2004   CORONARY ANGIOPLASTY WITH STENT PLACEMENT  07/2008   DEFIBRILLATOR  2009   ESOPHAGOGASTRODUODENOSCOPY N/A 09/15/2013   Procedure: ESOPHAGOGASTRODUODENOSCOPY (EGD);  Surgeon: Ladene Artist, MD;  Location[facility] ENDOSCOPY;  Service: Endoscopy;  Laterality: N/A;  bedside   HEMATOMA EVACUATION Right 11/06/2013   Procedure: EVACUATION HEMATOMA;  Surgeon: Gaye Pollack, MD;  Location: [place];  Service: Cardiothoracic;  Laterality: Right;   ICD GENERATOR CHANGEOUT N/A 06/28/2019   Procedure: ICD GENERATOR CHANGEOUT;  Surgeon: Constance Haw, MD;  Location: [place] CV LAB;  Service: Cardiovascular;  Laterality: N/A;   INSERT / REPLACE / REMOVE PACEMAKER  2009   INSERTION OF IMPLANTABLE LEFT VENTRICULAR ASSIST DEVICE N/A 09/18/2013    Procedure: INSERTION OF IMPLANTABLE LEFT VENTRICULAR ASSIST DEVICE;  Surgeon: Ivin Poot, MD;  Location: [place];  Service: Open Heart Surgery;  Laterality: N/A;  CIRC ARREST  NITRIC OXIDE   INTRA-AORTIC BALLOON PUMP INSERTION N/A 09/14/2013   Procedure: INTRA-AORTIC BALLOON PUMP INSERTION;  Surgeon: Jolaine Artist, MD;  Location: [facility] CATH LAB;  Service: Cardiovascular;  Laterality: N/A;   INTRAOPERATIVE TRANSESOPHAGEAL ECHOCARDIOGRAM N/A 09/18/2013   Procedure: INTRAOPERATIVE TRANSESOPHAGEAL ECHOCARDIOGRAM;  Surgeon: Ivin Poot, MD;  Location: [place];  Service: Open Heart Surgery;  Laterality: N/A;   KNEE ARTHROSCOPY     bilaterally   KNEE ARTHROSCOPY W/ PARTIAL MEDIAL MENISCECTOMY  12/2002   left   LEFT VENTRICULAR ASSIST DEVICE     RIGHT HEART CATHETERIZATION N/A 08/25/2013   Procedure: RIGHT HEART CATH;  Surgeon: Jolaine Artist, MD;  Location: [facility] CATH LAB;  Service: Cardiovascular;  Laterality: N/A;   RIGHT HEART CATHETERIZATION N/A 09/13/2013   Procedure: RIGHT HEART CATH;  Surgeon: Jolaine Artist, MD;  Location: [facility] CATH LAB;  Service: Cardiovascular;  Laterality: N/A;   RIGHT HEART CATHETERIZATION N/A 02/08/2014   Procedure: RIGHT HEART CATH;  Surgeon: Jolaine Artist, MD;  Location: [facility] C[facility] LAB;  Service: Cardiovascular;  Laterality: N/A;   RIGHT HEART CATHETERIZATION N/A 02/12/2014   Procedure: RIGHT HEART CATH;  Surgeon: Jolaine Artist, MD;  Location: [facility] CATH LAB;  Service: Cardiovascular;  Laterality: N/A;   TOTAL KNEE ARTHROPLASTY  11/27/11   left   TOTAL KNEE ARTHROPLASTY  11/27/2011   Procedure: TOTAL KNEE ARTHROPLASTY;  Surgeon: Yvette Rack., MD;  Location: [place];  Service: Orthopedics;  Laterality: Left;  left total knee arthroplasty   VIDEO ASSISTED THORACOSCOPY Right 11/06/2013   Procedure: VIDEO ASSISTED THORACOSCOPY;  Surgeon: Gaye Pollack, MD;  [facility] OR;  Service: Cardiothoracic;  Laterality: Right;    Family History: Family History   Problem Relation Age of Onset   Heart failure Father    Stroke Father    Coronary artery  disease Mother    Heart disease Mother    Hyperlipidemia Sister     Social History: Social History   Socioeconomic History   Marital status: Married    Spouse name: Not on file   Number of children: 1   Years of education: Not on file   Highest education level: Not on file  Occupational History   Occupation: Music therapist: RETIRED   Occupation: Copywriter, advertising  Tobacco Use   Smoking status: Never   Smokeless tobacco: Never  Vaping Use   Vaping Use: Never used  Substance and Sexual Activity   Alcohol use: No   Drug use: No    Types: Marijuana    Comment: "back in our younger days we smoked a little pot"   Sexual activity: Not Currently  Other Topics Concern   Not on file  Social History Narrative   Not on file   Social Determinants of Health   Financial Resource Strain: Not on file  Food Insecurity: Not on file  Transportation Needs: Not on file  Physical Activity: Not on file  Stress: Not on file  Social Connections: Not on file    Allergies:  Allergies  Allergen Reactions   Ace Inhibitors     Hypotension and elevated creatinine   Amiodarone Other (See Comments)    Cerebellar side effects   Diovan [Valsartan] Other (See Comments)    Hypotension at low dose, hyperkalemia   Lipitor [Atorvastatin Calcium] Other (See Comments)    Muscle pain   Jardiance [Empagliflozin] Other (See Comments)    Unsure of exact side effect   Norvasc [Amlodipine] Other (See Comments)    Put patient to sleep    Objective:    Vital Signs:   Temp:  [96.5 F (35.8 C)-97 F (36.1 C)] 97 F (36.1 C) (10/17 0550) Pulse Rate:  [82-169] 82 (10/17 0630) Resp:  [18-23] 18 (10/17 0630) BP: (123-139)/(105-122) 123/105 (10/17 0630) SpO2:  [91 %-99 %] 91 % (10/17 0630) Weight:  [77.1 kg] 77.1 kg (10/17 0551)    Weight change: Filed Weights   06/16/21 0551  Weight: 77.1 kg     Intake/Output:   Intake/Output Summary (Last 24 hours) at 06/16/2021 0710 Last data filed at 06/16/2021 0630 Gross per 24 hour  Intake 91.81 ml  Output --  Net 91.81 ml      Physical Exam    Physical Exam: GENERAL: No acute distress. HEENT: normal  NECK: Supple, JVP 5-6 .  2+ bilaterally, no bruits.  No lymphadenopathy or thyromegaly appreciated.   CARDIAC:  Mechanical heart sounds with LVAD hum present.  LUNGS:  Clear to auscultation bilaterally.  ABDOMEN:  Soft, round, nontender, positive bowel sounds x4.     LVAD exit site: well-healed and incorporated.  Dressing dry and intact.  No erythema or drainage.  Stabilization device present and accurately applied.  Driveline dressing is being changed daily per sterile technique. EXTREMITIES:  Warm and dry, no cyanosis, clubbing, rash or edema  NEUROLOGIC:  Alert and oriented x 3.    No aphasia.  No dysarthria.  Affect flat  Telemetry   VT 160s  EKG    VT 167  bpm   Labs   Basic Metabolic Panel: Recent Labs  Lab 06/16/21 0538  NA 132*  K 4.0  CL 100  CO2 23  GLUCOSE 143*  BUN 35*  CREATININE 2.24*  CALCIUM 9.2    Liver Function Tests: No results for input(s): AST, ALT, ALKPHOS, BILITOT, PROT,  ALBUMIN in the last 168 hours. No results for input(s): LIPASE, AMYLASE in the last 168 hours. No results for input(s): AMMONIA in the last 168 hours.  CBC: Recent Labs  Lab 06/16/21 0538  WBC 7.5  NEUTROABS 4.2  HGB 13.1  HCT 40.1  MCV 93.0  PLT 170    Cardiac Enzymes: No results for input(s): CKTOTAL, CKMB, CKMBINDEX, TROPONINI in the last 168 hours.  BNP: BNP (last 3 results) Recent Labs    12/01/20 1631  BNP 409.8*    ProBNP (last 3 results) No results for input(s): PROBNP in the last 8760 hours.   CBG: No results for input(s): GLUCAP in the last 168 hours.  Coagulation Studies: Recent Labs    06/16/21 0538  LABPROT 32.7*  INR 3.2*     Imaging   No results found.   Medications:      Current Medications:  propofol  0.5 mg/kg Intravenous Once    Infusions:    Assessment/Plan   VT -Recurrent VT 10/21. Now on sotalol.  -Developed VT 06/15/21. -Medtronic interrogation- Setting  for VT 231 so no shock. .  - Given 150 mg amio in ED.  -Taking sotalol. K stable . Mag pending. - Set up EP.  - DC-CV x1 performed by Dr Haroldine Laws . Restoration NSR  - He has EP follow up 06/24/21 may need to consider ablation.   2. Chronic Systolic HF with HMII LVAD  -INR and LDH stable. Maps stable -Volume status stable -On aspirin -Remains off all diuretics including spiro - Failed Jardiance due to volume depletion - He has not tolerated ACE-I or amlodipine or b-blocker in the past.  3.PAF  -Paced out in 01/2021 - Continue sotalol  4. Chronic Anticoagulation  On coumadin. INR goal 2-3  5. CKD Stage IIIb Creatinine baseline ~2 Creatinine today 2.2        Length of Stay: 0  Amy Clegg, NP  06/16/2021, 7:10 AM  Advanced Heart Failure Team Pager [phone] (M-F; 7a - 5p)  Please contact [facility] for night-coverage after hours (4p -7a ) and weekends on [email or web address]  Agree with above.   80 y/o male with advanced systolic HF s/p HM-II VAD placement. Presented to ER with lightheadedness and LOW FLOW alerts on VAD. Found to be in recurrent VT. By ICD interrogation looked to be about 36 hours old.   Treated with IV amio without effects.   Patent then sedated by Dr. Francia Greaves and we performed DC-CV with restoration of NSR. Post DC-CV patient had hypotension and persistent low flows. I gave him IV neosynephrine and 1.5 L of fluid. VAD speed turned 9400-> 9000  General:  NAD.  HEENT: normal  Neck: supple. JVP not elevated.  Carotids 2+ bilat; no bruits. No lymphadenopathy or thryomegaly appreciated. Cor: LVAD hum.  Lungs: Clear. Abdomen:  soft, nontender, non-distended. No hepatosplenomegaly. No bruits or masses. Good bowel sounds. Driveline site clean. Anchor in  place.  Extremities: no cyanosis, clubbing, rash. Warm no edema  Neuro: alert & oriented x 3. No focal deficits. Moves all 4 without problem   Recurrent VT. Now s/p emergent DC-CV in ER. Post DC-CV with persistent low flows due to RV failure. VAD parameters and pacing profile adjusted. Will observe in ED for 60 minutes if flows remain low will need admission.   Continue sotalol. Will consider adding mexilitene 150 bid.   CRITICAL CARE Performed by: Glori Bickers  Total critical care time: 60 minutes  Critical care time was exclusive of separately  billable procedures and treating other patients.  Critical care was necessary to treat or prevent imminent or life-threatening deterioration.  Critical care was time spent personally by me (independent of midlevel providers or residents) on the following activities: development of treatment plan with patient and/or surrogate as well as nursing, discussions with consultants, evaluation of patient's response to treatment, examination of patient, obtaining history from patient or surrogate, ordering and performing treatments and interventions, ordering and review of laboratory studies, ordering and review of radiographic studies, pulse oximetry and re-evaluation of patient's condition.  Glori Bickers, MD  8:30 AM

## 2021-06-16 NOTE — ED Provider Notes (Signed)
Seen after prior EDP.  Patient's is seen in the ED by Dr. Haroldine Laws with the LVAD team.  Patient consents to cardioversion.   Conscious sedation with propofol performed in the ED.  Patient did experience mild apnea during sedation.  This required BVM ventilation.  No hypoxia was noted.  Patient also had transient hypotension with required use of phenylephrine push dose.  Cardioversion was successful after 1 shock. Cardiology performed cardioversion.  LVAD team was happy with outcome of cardioversion.  Patient remained in the ED for several hours sedation/cardioversion after observing his cardiac output.  .Sedation  Date/Time: 06/16/2021 10:17 AM Performed by: Valarie Merino, MD Authorized by: Valarie Merino, MD   Consent:    Consent obtained:  Written   Consent given by:  Patient and spouse   Risks discussed:  Allergic reaction, dysrhythmia, inadequate sedation, nausea, vomiting, respiratory compromise necessitating ventilatory assistance and intubation, prolonged sedation necessitating reversal and prolonged hypoxia resulting in organ damage   Alternatives discussed:  Analgesia without sedation Universal protocol:    Immediately prior to procedure, a time out was called: yes   Indications:    Procedure performed:  Cardioversion   Procedure necessitating sedation performed by:  Different physician Pre-sedation assessment:    Time since last food or drink:  12   ASA classification: class 4 - patient with severe systemic disease that is a constant threat to life     Mouth opening:  3 or more finger widths   Thyromental distance:  4 finger widths   Mallampati score:  I - soft palate, uvula, fauces, pillars visible   Neck mobility: normal     Pre-sedation assessments completed and reviewed: airway patency, cardiovascular function, hydration status, mental status, nausea/vomiting, pain level, respiratory function and temperature   Immediate pre-procedure details:     Reassessment: Patient reassessed immediately prior to procedure     Reviewed: vital signs, relevant labs/tests and NPO status     Verified: bag valve mask available, emergency equipment available, intubation equipment available, IV patency confirmed, oxygen available, reversal medications available and suction available   Procedure details (see MAR for exact dosages):    Preoxygenation:  Room air and nasal cannula   Sedation:  Propofol   Intended level of sedation: deep and moderate (conscious sedation)   Intra-procedure monitoring:  Blood pressure monitoring, cardiac monitor, continuous pulse oximetry, continuous capnometry, frequent LOC assessments and frequent vital sign checks   Intra-procedure events: hypotension and respiratory depression     Intra-procedure management:  Airway repositioning, BVM ventilation and fluid bolus   Total Provider sedation time (minutes):  40 Post-procedure details:    Attendance: Constant attendance by certified staff until patient recovered     Recovery: Patient returned to pre-procedure baseline     Post-sedation assessments completed and reviewed: airway patency, cardiovascular function, hydration status, mental status, nausea/vomiting, pain level, respiratory function and temperature     Patient is stable for discharge or admission: yes     Procedure completion:  Tolerated well, no immediate complications    Valarie Merino, MD 06/16/21 1021

## 2021-06-16 NOTE — ED Notes (Signed)
Phenylephrine 75mggiven 0759 Phenylephrine 80 mcq given 0800 Phenylephine 876m given 0803 Phenylephrine 80 mcg given.  B/p 179/103 Patient is awake and alert talking. Wife at bedside.

## 2021-06-16 NOTE — CV Procedure (Signed)
    DIRECT CURRENT CARDIOVERSION  NAME:  Johnny Oneill   MRN: [id number] DOB:  1940/09/14   ADMIT DATE: 06/16/2021   INDICATIONS: Ventricular tachycardia   PROCEDURE:   Informed consent was obtained prior to the procedure. The risks, benefits and alternatives for the procedure were discussed and the patient comprehended these risks. Once an appropriate time out was taken, the patient had the defibrillator pads placed in the anterior and posterior position. The patient then underwent sedation by Dr. Francia Greaves in the ED. Once an appropriate level of sedation was achieved, the patient received a single biphasic, synchronized 200J shock with prompt conversion to sinus rhythm. Post DC-CV had persistent low flows on VAD treated with IVF and  VAD/pacer reprogramming.   Glori Bickers, MD  8:30 AM

## 2021-06-16 NOTE — ED Triage Notes (Signed)
Came from home LVAD pt. Per pt alarm went off on controller saying low flow, second time low flow, third time said call hosp.SOB with walking since Saturday.

## 2021-06-16 NOTE — Progress Notes (Signed)
Asked to see patient with VT.   Upon my arrival patient had been cardioverted to NSR.   By request of HF team LRL of device changed to 90 to give patient more flow to his VAD.   Continue sotalol 80 mg DAILY. Had considered increased to 120 mg daily, but patient is on borderline of actually requiring dose REDUCTION to q 36-48 hours by renal function. QTc has remained stable.   Alternatively, may consider addition of mexiletine. Limited data for combination, but no evidence of harm in available data per discussion with Pharm-D. Will discuss with MD.   Please call back if request is to change LRL prior to discharge if pt goes home today.   Legrand Como [address]" Peck, PA-C  06/16/2021 9:09 AM

## 2021-06-18 ENCOUNTER — Telehealth (HOSPITAL_COMMUNITY): Payer: Self-pay | Admitting: *Deleted

## 2021-06-18 NOTE — Telephone Encounter (Addendum)
Wife called VAD pager to report patient had one Low Flow alarm this am with current VAD parameters: flow 4.2, PI 5.4  Pt feels he has "fluid on board" due to abdominal swelling. His wt is 174 lbs today (he doesn't weigh consistently at home); doppler BP 120; unable to get automatic cuff pressure for correlation. He is asking if he can take prn diuretic; he denies any other HF sxs other than abdominal swelling.   Give patient Lasix 40 mg x one dose today; will see him tomorrow in VAD Clinic per Dr. Haroldine Laws. Pt and wife verbalized agreement to same. [name] will call if any more VAD alarms.  Zada Girt RN, VAD Coordinator 24/7 VAD pager:  [phone]

## 2021-06-19 ENCOUNTER — Ambulatory Visit (HOSPITAL_COMMUNITY)
Admission: RE | Admit: 2021-06-19 | Discharge: 2021-06-19 | Disposition: A | Discharge status: Home / Self Care | Payer: Medicare Other | Source: Ambulatory Visit | Attending: Internal Medicine | Admitting: Internal Medicine

## 2021-06-19 ENCOUNTER — Ambulatory Visit (HOSPITAL_COMMUNITY): Payer: Self-pay | Admitting: Pharmacist

## 2021-06-19 ENCOUNTER — Telehealth (HOSPITAL_COMMUNITY): Payer: Self-pay | Admitting: Pharmacist

## 2021-06-19 ENCOUNTER — Encounter (HOSPITAL_COMMUNITY): Payer: Self-pay

## 2021-06-19 ENCOUNTER — Other Ambulatory Visit: Payer: Self-pay

## 2021-06-19 ENCOUNTER — Other Ambulatory Visit (HOSPITAL_COMMUNITY): Payer: Self-pay | Admitting: *Deleted

## 2021-06-19 VITALS — BP 135/0 | HR 118 | Wt 175.0 lb

## 2021-06-19 DIAGNOSIS — I5022 Chronic systolic (congestive) heart failure: Secondary | ICD-10-CM | POA: Insufficient documentation

## 2021-06-19 DIAGNOSIS — Z7901 Long term (current) use of anticoagulants: Secondary | ICD-10-CM

## 2021-06-19 DIAGNOSIS — Z95811 Presence of heart assist device: Secondary | ICD-10-CM | POA: Diagnosis not present

## 2021-06-19 DIAGNOSIS — N1832 Chronic kidney disease, stage 3b: Secondary | ICD-10-CM | POA: Insufficient documentation

## 2021-06-19 DIAGNOSIS — I255 Ischemic cardiomyopathy: Secondary | ICD-10-CM | POA: Insufficient documentation

## 2021-06-19 DIAGNOSIS — E039 Hypothyroidism, unspecified: Secondary | ICD-10-CM | POA: Diagnosis not present

## 2021-06-19 DIAGNOSIS — R140 Abdominal distension (gaseous): Secondary | ICD-10-CM | POA: Insufficient documentation

## 2021-06-19 DIAGNOSIS — R748 Abnormal levels of other serum enzymes: Secondary | ICD-10-CM | POA: Diagnosis not present

## 2021-06-19 DIAGNOSIS — I130 Hypertensive heart and chronic kidney disease with heart failure and stage 1 through stage 4 chronic kidney disease, or unspecified chronic kidney disease: Secondary | ICD-10-CM | POA: Insufficient documentation

## 2021-06-19 DIAGNOSIS — M1711 Unilateral primary osteoarthritis, right knee: Secondary | ICD-10-CM | POA: Diagnosis not present

## 2021-06-19 DIAGNOSIS — E785 Hyperlipidemia, unspecified: Secondary | ICD-10-CM | POA: Diagnosis not present

## 2021-06-19 DIAGNOSIS — Z79899 Other long term (current) drug therapy: Secondary | ICD-10-CM | POA: Diagnosis not present

## 2021-06-19 DIAGNOSIS — I472 Ventricular tachycardia, unspecified: Secondary | ICD-10-CM | POA: Diagnosis not present

## 2021-06-19 DIAGNOSIS — I2510 Atherosclerotic heart disease of native coronary artery without angina pectoris: Secondary | ICD-10-CM | POA: Diagnosis not present

## 2021-06-19 DIAGNOSIS — I4892 Unspecified atrial flutter: Secondary | ICD-10-CM | POA: Diagnosis not present

## 2021-06-19 DIAGNOSIS — I493 Ventricular premature depolarization: Secondary | ICD-10-CM | POA: Diagnosis not present

## 2021-06-19 DIAGNOSIS — Z95810 Presence of automatic (implantable) cardiac defibrillator: Secondary | ICD-10-CM | POA: Insufficient documentation

## 2021-06-19 DIAGNOSIS — Z7982 Long term (current) use of aspirin: Secondary | ICD-10-CM | POA: Insufficient documentation

## 2021-06-19 MED ORDER — DOXYCYCLINE HYCLATE 100 MG PO CAPS: 100.0000 mg | ORAL_CAPSULE | Freq: Two times a day (BID) | ORAL | 0 refills | Status: AC

## 2021-06-19 MED ORDER — MEXILETINE HCL 150 MG PO CAPS
150.0000 mg | ORAL_CAPSULE | Freq: Two times a day (BID) | ORAL | 6 refills | Status: DC
Start: 2021-06-19 — End: 2022-01-27

## 2021-06-19 NOTE — Patient Instructions (Signed)
Start Mexiletine 150 mg twice daily Start Doxycyline 100 mg twice daily for 7 days Coumadin dosing per Ander Purpura PharmD- no changes Return to VAD clinic in 1 week for labs Return to [place] clinic in 1 month for follow up with Dr Haroldine Laws

## 2021-06-19 NOTE — Progress Notes (Addendum)
Patient presents for sick visit f/u in [facility] today with [name]. Reports no problems with VAD equipment or concerns with drive line.   Pt was seen in ER 05/2021 due to LOW FLOW alarms. At that time was experiencing occasional lightheadedness. Presented to ER and was found to be in VT rate 170. DCCV x 1 - returned to [place] with frequent PVCs. Pacer settings adjusted by [name] PA- DDD 90. Post cardioversion persistent LOW FLOWS noted. Received 2 L IVF. Low flows resolved, and he was discharged home.   Presents to clinic today with further low flows. Denies lightheadedness, dizziness, falls, and signs of bleeding. Denies shortness of breath, but [name] reports he is short of breath walking from the car to the house. He has had 25 asymptomatic LOW FLOWS since 0400 this morning. Reports he was laying in bed when the majority of alarms occurred. On interrogation today flow noted to be 2.4 - 3.9 with PI 2.1 - 6.3 at home. No power spikes noted. Doppler BP at home yesterday was 120. Took Lasix 40 mg yesterday as instructed. Good urine output noted.   Orthostatic vital signs today as documented below. No low flow alarms occurred while patient was in clinic. If pt continues to have low flows will plan for RAMP echo with next appt. Pt to notify VAD coordinators if alarms continue.  HR ranging from DDD 90 to 122 (? Afib). EKG obtained. Medtronic device interrogated. Both reviewed with Dr Haroldine Laws. Per Dr Haroldine Laws will start Mexiletine 150 mg BID. Prescription sent in to patient's pharmacy.   Pt's exit is red today thru [place] window. [name] reports she changed the dressing last night and no redness was present. See dressing change documentation below; dressing changed to daily dressing kit. Red rash noted under biopatch and dressing. Scant draiange noted on biopatch. Discussed with Dr Haroldine Laws. Will start Doxycycline 100 mg BID x 7 days. Instructed to change dressing on Saturday and notify VAD  coordinators if site looks worse. Instructed not to shower until infection clears. May advance back to weekly dressing kit once rash and drainage clears. Discussed how to apply Silverlon patch with [name].   No labs obtained today per Dr Haroldine Laws. Labs were drawn when pt was in ER 05/2021.   Vital Signs:   Temp:  Doppler Pressure: 135 Automatc BP:   Laying: 117/83 (98)  HR 118 -- slowed to 90  Sitting: 122/97 (110)  HR 89   Standing: 117/90 (103)  HR 90 HR: DDD 90 w/ occasional PVCs. Periodically increases to 118 - 120 SPO2: 97% on RA   Weight: lb w/ eqt Last weight: 173.8 lb  VAD Indication: Destination therapy- patient choice     VAD interrogation & Equipment Management: Speed: 9400 Flow: 4.3 Power: 5.6w    PI: 5.5   Alarms: 25 asymptomatic LOW FLOWs since 0400 this morning Events: 100 +   Fixed speed 9600  Low speed limit: 9000   Primary Controller:  Replace back up battery in 16 months Back up controller:  Replace back up battery in 29 months -- did not bring today   Annual Equipment Maintenance on UBC/PM was performed on 10/2020.   I reviewed the LVAD parameters from today and compared the results to the patient's prior recorded data. LVAD interrogation was NEGATIVE for significant power changes, NEGATIVE for clinical alarms and STABLE for PI events/speed drops. No programming changes were made and pump is functioning within specified parameters. Pt is performing daily controller and  system monitor self tests along with completing weekly and monthly maintenance for LVAD equipment.  Exit Site Care: Drive line is being maintained weekly by [name], his wife. Dressing dry and intact- biopatch in place. Pt has CHG allergy. Stabilization device present and accurately applied. Existing VAD dressing removed and site care performed using sterile technique. Drive line exit site cleaned with saline wipe x 2, allowed to dry, and gauze dressing applied. Exit site healed and incorporated,  the velour is fully implanted at exit site. Redness noted around exit site with rash present under previous dressing. Denies tenderness. Denies trauma to site. Scant yellow/brown drainage, no foul odor noted. Drive line anchor re-applied. Pt denies fever or chills. Instructed to change dressing every other day using daily dressing kit. May advance back to weekly changes once rash, redness, and drainage resolve. Has adequate daily kits at home. Provided with 8 weekly kits for home use.      Significant Events on VAD Support:  10/2013>SBO 12/2016> elevated LDH, admit for [place]   Device: Dual Medtronic Therapies:  V therapy on at 222 bpm; V monitor on 171         A therapy on 171 bpm (ramp and burst overdrive pacing);         A monitor on 300 bpm  Pacing: AAI<=>DDD 80 bpm (increased from 60 on 04/20/21) Last check: 03/27/21  BP & Labs:  Doppler 135 - Doppler is reflecting MAP   Hgb 13.1 in ER 06/19/21 - No S/S of bleeding. Specifically denies melena/BRBPR or nosebleeds.   LDH 294 in ER 06/19/21 - established baseline of 200- 350. Denies tea-colored urine. No power elevations noted on interrogation.   Patient Instructions:  Start Mexiletine 150 mg twice daily Start Doxycyline 100 mg twice daily for 7 days Coumadin dosing per Ander Purpura PharmD- no changes Return to VAD clinic in 1 week for labs Return to [place] clinic in 1 month for follow up with Dr Haroldine Laws   Emerson Monte RN [place] Coordinator  Office: [phone]  24/7 Pager: [phone]

## 2021-06-19 NOTE — Progress Notes (Signed)
LVAD INR 

## 2021-06-19 NOTE — Telephone Encounter (Signed)
Patient Advocate Encounter   Received notification from [facility] that prior authorization for mexiletine is required.   PA submitted on CoverMyMeds Key [id number] Status is pending   Will continue to follow.   Audry Riles, PharmD, BCPS, BCCP, CPP Heart Failure Clinic Pharmacist [phone]

## 2021-06-23 NOTE — Telephone Encounter (Signed)
Advanced Heart Failure Patient Advocate Encounter  Prior Authorization for Mexiletine has been approved.    PA# [id number] Effective dates: 06/19/21 through 08/30/22  Audry Riles, PharmD, BCPS, BCCP, CPP Heart Failure Clinic Pharmacist [phone]

## 2021-06-23 NOTE — Progress Notes (Signed)
Electrophysiology Office Note Date: 06/23/2021  ID:  Johnny Oneill, Johnny Oneill [birth year], MRN [id number]  PCP: Lavone Orn, MD Primary Cardiologist: None Electrophysiologist: Will Meredith Leeds, MD   CC: Routine ICD follow-up  Johnny Oneill is a 81 y.o. male seen today for Will Meredith Leeds, MD for post hospital follow up.   Seen in ED last week for VT. Appears to have been Dual tachycardia by device interrogation.  Started on mexiletine as outpatient once stable.  Today, he feels good. He has not had any further episodes on his device or lightheadedness since ED visit. He did have low flow over weekend, but has since been stable. He denies chest pain, dizziness, or undue SOB. Had plans to go help with a gun show/tournament this weekend but second guessing this as no local VAD hospital.   Device History: Medtronic Dual Chamber ICD implanted 2009, gen change 06/2019 History of appropriate therapy: Yes History of AAD therapy: Yes; previously stopped amiodarone due to cerebellar effects  Past Medical History:  Diagnosis Date   Anxiety    Automatic implantable cardioverter-defibrillator in situ    CAD (coronary artery disease)    S/P stenting of LCX in 2004;  s/p Lat MI 2009 with occlusion of the LCX - treated with Promus stenting. Has total occlusion of the RCA.    CHF (congestive heart failure) (HCC)    Hypercholesteremia    Hypertension    Hypothyroidism    ICD (implantable cardiac defibrillator) in place    PROPHYLACTIC      medtronic   Inferior MI ([place]) "? date"; 2009   Ischemic cardiomyopathy    a. 10/09 Echo: Sev LV Dysfxn, inf/lat AK, Mod MR.   Liver disease    LVAD (left ventricular assist device) present ([place])    Osteoarthritis    a. s/p R TKR   Pacemaker    PVC (premature ventricular contraction)    RBBB    Shortness of breath    Past Surgical History:  Procedure Laterality Date   CARPAL TUNNEL RELEASE Right 05/29/2016   Procedure: CARPAL TUNNEL RELEASE;   Surgeon: Earlie Server, MD;  Location: [place];  Service: Orthopedics;  Laterality: Right;   CORONARY ANGIOPLASTY WITH STENT PLACEMENT  2004   CORONARY ANGIOPLASTY WITH STENT PLACEMENT  07/2008   DEFIBRILLATOR  2009   ESOPHAGOGASTRODUODENOSCOPY N/A 09/15/2013   Procedure: ESOPHAGOGASTRODUODENOSCOPY (EGD);  Surgeon: Ladene Artist, MD;  Location: [facility] ENDOSCOPY;  Service: Endoscopy;  Laterality: N/A;  bedside   HEMATOMA EVACUATION Right 11/06/2013   Procedure: EVACUATION HEMATOMA;  Surgeon: Gaye Pollack, MD;  Location: [place];  Service: Cardiothoracic;  Laterality: Right;   ICD GENERATOR CHANGEOUT N/A 06/28/2019   Procedure: ICD GENERATOR CHANGEOUT;  Surgeon: Constance Haw, MD;  Location: [place] CV LAB;  Service: Cardiovascular;  Laterality: N/A;   INSERT / REPLACE / REMOVE PACEMAKER  2009   INSERTION OF IMPLANTABLE LEFT VENTRICULAR ASSIST DEVICE N/A 09/18/2013   Procedure: INSERTION OF IMPLANTABLE LEFT VENTRICULAR ASSIST DEVICE;  Surgeon: Ivin Poot, MD;  Location: [place];  Service: Open Heart Surgery;  Laterality: N/A;  CIRC ARREST  NITRIC OXIDE   INTRA-AORTIC BALLOON PUMP INSERTION N/A 09/14/2013   Procedure: INTRA-AORTIC BALLOON PUMP INSERTION;  Surgeon: Jolaine Artist, MD;  Location: [facility] CATH LAB;  Service: Cardiovascular;  Laterality: N/A;   INTRAOPERATIVE TRANSESOPHAGEAL ECHOCARDIOGRAM N/A 09/18/2013   Procedure: INTRAOPERATIVE TRANSESOPHAGEAL ECHOCARDIOGRAM;  Surgeon: Ivin Poot, MD;  Location: [place];  Service: Open Heart Surgery;  Laterality: N/A;   KNEE ARTHROSCOPY     bilaterally   KNEE ARTHROSCOPY W/ PARTIAL MEDIAL MENISCECTOMY  12/2002   left   LEFT VENTRICULAR ASSIST DEVICE     RIGHT HEART CATHETERIZATION N/A 08/25/2013   Procedure: RIGHT HEART CATH;  Surgeon: Jolaine Artist, MD;  Location: [facility] CATH LAB;  Service: Cardiovascular;  Laterality: N/A;   RIGHT HEART CATHETERIZATION N/A 09/13/2013   Procedure: RIGHT HEART CATH;  Surgeon: Jolaine Artist, MD;   Location: [facility] CATH LAB;  Service: Cardiovascular;  Laterality: N/A;   RIGHT HEART CATHETERIZATION N/A 02/08/2014   Procedure: RIGHT HEART CATH;  Surgeon: Jolaine Artist, MD;  Location: [facility] CATH LAB;  Service: Cardiovascular;  Laterality: N/A;   RIGHT HEART CATHETERIZATION N/A 02/12/2014   Procedure: RIGHT HEART CATH;  Surgeon: Jolaine Artist, MD;  [facility] C[facility] LAB;  Service: Cardiovascular;  Laterality: N/A;   TOTAL KNEE ARTHROPLASTY  11/27/11   left   TOTAL KNEE ARTHROPLASTY  11/27/2011   Procedure: TOTAL KNEE ARTHROPLASTY;  Surgeon: Yvette Rack., MD;  Location: [place];  Service: Orthopedics;  Laterality: Left;  left total knee arthroplasty   VIDEO ASSISTED THORACOSCOPY Right 11/06/2013   Procedure: VIDEO ASSISTED THORACOSCOPY;  Surgeon: Gaye Pollack, MD;  Location: [facility] OR;  Service: Cardiothoracic;  Laterality: Right;    Current Outpatient Medications  Medication Sig Dispense Refill   acetaminophen (TYLENOL) 500 MG tablet Take 500-1,000 mg by mouth every 6 (six) hours as needed (pain).     aspirin EC 81 MG tablet Take 81 mg by mouth in the morning.     cholecalciferol (VITAMIN D3) 25 MCG (1000 UT) tablet Take 1,000 Units by mouth in the morning.     citalopram (CELEXA) 10 MG tablet Take 1 tablet by mouth once daily (Patient taking differently: Take 10 mg by mouth daily.) 90 tablet 3   Coenzyme Q10 (COQ10) 200 MG CAPS Take 200 mg by mouth in the morning.     diphenhydrAMINE (SOMINEX) 25 MG tablet Take 25 mg by mouth at bedtime as needed for sleep.     doxycycline (VIBRAMYCIN) 100 MG capsule Take 1 capsule (100 mg total) by mouth 2 (two) times daily for 7 days. 14 capsule 0   doxylamine, Sleep, (UNISOM) 25 MG tablet Take 25 mg by mouth at bedtime as needed for sleep.     famotidine (PEPCID) 20 MG tablet Take 20 mg by mouth in the morning.     furosemide (LASIX) 20 MG tablet Take 1 tablet (20 mg total) by mouth daily as needed (Take 20 mg as daily as needed for wt > 153 lbs). Reported  on 01/14/2016 (Patient taking differently: Take 20 mg by mouth daily as needed (Take 20 mg as daily as needed for wt > 153 lbs).) 30 tablet 6   levothyroxine (EUTHYROX) 137 MCG tablet Take 1 tablet (137 mcg total) by mouth daily before breakfast. 90 tablet 1   melatonin 5 MG TABS Take 5 mg by mouth at bedtime as needed (sleep).     mexiletine (MEXITIL) 150 MG capsule Take 1 capsule (150 mg total) by mouth 2 (two) times daily. 60 capsule 6   pantoprazole (PROTONIX) 40 MG tablet Take 1 tablet (40 mg total) by mouth daily. 90 tablet 3   potassium chloride SA (KLOR-CON) 20 MEQ tablet Take 1 tablet (20 mEq total) by mouth daily as needed. Take one potassium pill when you take your lasix. (Patient taking differently: Take 20 mEq by mouth  daily as needed (when taking furosemide for weight gain).) 30 tablet 6   Probiotic Product (PROBIOTIC PO) Take 1 capsule by mouth in the morning.     rosuvastatin (CRESTOR) 20 MG tablet Take 1 tablet (20 mg total) by mouth daily. 90 tablet 3   sotalol (BETAPACE) 80 MG tablet TAKE 1 TABLET (80 MG TOTAL) BY MOUTH DAILY. (Patient taking differently: Take 80 mg by mouth daily.) 90 tablet 3   traMADol (ULTRAM) 50 MG tablet Take 1-2 tablets (50-100 mg total) by mouth every 6 (six) hours as needed for moderate pain. (Patient not taking: Reported on 06/19/2021) 45 tablet 5   traZODone (DESYREL) 50 MG tablet Take 1 tablet (50 mg total) by mouth at bedtime. (Patient taking differently: Take 50 mg by mouth at bedtime as needed for sleep.) 30 tablet 5   warfarin (COUMADIN) 5 MG tablet Take 5 mg (1 tablet) every Monday/Friday and 7.5 mg (1.5 tablets) all other days OR as directed by HF Clinic (Patient taking differently: Take 5-7.5 mg by mouth See admin instructions. Take 5 mg on Monday and Friday 7.5 mg on Tuesday,Wednesday,Thursday,Saturday and sunday) 135 tablet 11   No current facility-administered medications for this visit.   Facility-Administered Medications Ordered in Other  Visits  Medication Dose Route Frequency Provider Last Rate Last Admin   etomidate (AMIDATE) injection    Anesthesia Intra-op Myna Bright, CRNA   20 mg at 02/08/14 0915   rocuronium [facility]) injection    Anesthesia Intra-op Myna Bright, CRNA   50 mg at 02/08/14 0932   succinylcholine (ANECTINE) injection    Anesthesia Intra-op Myna Bright, CRNA   100 mg at 02/08/14 0915    Allergies:   Ace inhibitors, Amiodarone, Diovan [valsartan], Lipitor [atorvastatin calcium], Jardiance [empagliflozin], and Norvasc [amlodipine]   Social History: Social History   Socioeconomic History   Marital status: Married    Spouse name: Not on file   Number of children: 1   Years of education: Not on file   Highest education level: Not on file  Occupational History   Occupation: Music therapist: RETIRED   Occupation: Copywriter, advertising  Tobacco Use   Smoking status: Never   Smokeless tobacco: Never  Vaping Use   Vaping Use: Never used  Substance and Sexual Activity   Alcohol use: No   Drug use: No    Types: Marijuana    Comment: "back in our younger days we smoked a little pot"   Sexual activity: Not Currently  Other Topics Concern   Not on file  Social History Narrative   Not on file   Social Determinants of Health   Financial Resource Strain: Not on file  Food Insecurity: Not on file  Transportation Needs: Not on file  Physical Activity: Not on file  Stress: Not on file  Social Connections: Not on file  Intimate Partner Violence: Not on file    Family History: Family History  Problem Relation Age of Onset   Heart failure Father    Stroke Father    Coronary artery disease Mother    Heart disease Mother    Hyperlipidemia Sister     Review of Systems: Review of systems complete and found to be negative unless listed in HPI.     Physical Exam: There were no vitals filed for this visit.   General: Pleasant, NAD. No resp difficulty Psych: Normal  affect. HEENT:  Normal, without mass or lesion.  Neck: Supple, no bruits or JVD. Carotids 2+. No lymphadenopathy/thyromegaly appreciated. Heart: VAD hum Lungs:  Resp regular and unlabored, CTA. Abdomen: Soft, non-tender, non-distended, No HSM, BS + x 4.   Extremities: No clubbing, cyanosis or edema. DP/PT/Radials 2+ and equal bilaterally. Neuro: Alert and oriented X 3. Moves all extremities spontaneously.   ICD interrogation- reviewed in detail today,  See PACEART report  EKG:  EKG is ordered today. Shows AV dual pacing at 80. QTc  is borderline to slightly prolonged when measured manually and corrected for QRS. Best measurement is between right around 500-510 ms.   Recent Labs: 12/01/2020: B Natriuretic Peptide 409.8 03/20/2021: ALT 18 03/28/2021: TSH 0.98 04/20/2021: Magnesium 2.2 06/16/2021: BUN 35; Creatinine, Ser 2.24; Hemoglobin 13.1; Platelets 170; Potassium 4.0; Sodium 132   Wt Readings from Last 3 Encounters:  06/19/21 175 lb (79.4 kg)  06/16/21 170 lb (77.1 kg)  05/27/21 172 lb (78 kg)     Other studies Reviewed: Additional studies/ records that were reviewed today include: Previous EP and HF office notes, Recent ED notes  Assessment and Plan:  1.  Chronic systolic dysfunction s/p Medtronic dual chamber ICD  euvolemic today Stable on an appropriate medical regimen Normal ICD function See Pace Art report LRL changed to 80 today.  LVAD HM2 managed by CHF team He uses lasix as needed only.  2. VT  Recent breakthrough on sotalol. Borderline for dose reduction, continuing for now, If renal function worsens will have to cut back or stop.  Continue mexiletine 150 mg BID. Can titrate up if needed, but will need to continue to follow QT closely with potential for additive effect on both mexiletine and sotalol.   3. Atrial flutter NSR by device.   4. Frequent PVCs Poor options for AAD having failed amio Continue sotalol and now with added mexiletine.  VP now >  90%. Difficult situation.   At least 1 study showed IMPROVED outcomes in LVAD patients when comparing RV pacing to BiV pacing (in favor of RV pacing).    [address] BB, [employer] JS, Harris T, Elenora Fender AB, Narang N, Holzhauser LH, Burkhoff D, Lang RM, [place], [place]. Biventricular Pacing Versus Right Ventricular Pacing in Patients Supported With LVAD. JACC Clin Electrophysiol. 2021 Aug;7(8):1003-1009. doi: 10.1016/j.jacep.2021.01.016. Epub 2021 Jun 30. PMID: 01007121.  "In the first prospective randomized study comparing variable pacing in LVAD patients, RV pacing was associated with significantly improved functional status, quality of life, fewer ventricular tachyarrhythmias, and stable lead impedance compared with BiV pacing. This study supports turning off LV lead pacing in LVAD patients with CRT."  Current medicines are reviewed at length with the patient today.    Disposition:   Follow up 4-6 weeks with Dr. Curt Bears to further discuss potential management of his VT/PVCs  Signed, Annamaria Helling  06/23/2021 1:15 PM  [place] [address] [phone] (office) [phone] (fax)

## 2021-06-24 ENCOUNTER — Ambulatory Visit (HOSPITAL_COMMUNITY)
Admission: RE | Admit: 2021-06-24 | Discharge: 2021-06-24 | Disposition: A | Discharge status: Home / Self Care | Payer: Medicare Other | Source: Ambulatory Visit | Attending: Cardiology | Admitting: Cardiology

## 2021-06-24 ENCOUNTER — Other Ambulatory Visit: Payer: Self-pay

## 2021-06-24 ENCOUNTER — Ambulatory Visit (INDEPENDENT_AMBULATORY_CARE_PROVIDER_SITE_OTHER): Payer: Medicare Other | Admitting: Student

## 2021-06-24 ENCOUNTER — Telehealth (HOSPITAL_COMMUNITY): Payer: Self-pay | Admitting: Unknown Physician Specialty

## 2021-06-24 ENCOUNTER — Ambulatory Visit (HOSPITAL_COMMUNITY): Payer: Self-pay | Admitting: Pharmacist

## 2021-06-24 ENCOUNTER — Encounter: Payer: Self-pay | Admitting: Student

## 2021-06-24 VITALS — HR 89 | Ht 67.0 in | Wt 174.0 lb

## 2021-06-24 DIAGNOSIS — Z95811 Presence of heart assist device: Secondary | ICD-10-CM | POA: Insufficient documentation

## 2021-06-24 DIAGNOSIS — I255 Ischemic cardiomyopathy: Secondary | ICD-10-CM

## 2021-06-24 DIAGNOSIS — I472 Ventricular tachycardia, unspecified: Secondary | ICD-10-CM | POA: Diagnosis not present

## 2021-06-24 DIAGNOSIS — Z7901 Long term (current) use of anticoagulants: Secondary | ICD-10-CM | POA: Diagnosis not present

## 2021-06-24 DIAGNOSIS — I493 Ventricular premature depolarization: Secondary | ICD-10-CM

## 2021-06-24 LAB — CUP PACEART INCLINIC DEVICE CHECK
Battery Remaining Longevity: 86 mo
Battery Voltage: 2.97 V
Brady Statistic AP VP Percent: 71.23 %
Brady Statistic AP VS Percent: 6.41 %
Brady Statistic AS VP Percent: 22.03 %
Brady Statistic AS VS Percent: 0.32 %
Brady Statistic RA Percent Paced: 77.01 %
Brady Statistic RV Percent Paced: 92.93 %
Date Time Interrogation Session: 20221025213017
HighPow Impedance: 36 Ohm
HighPow Impedance: 44 Ohm
Implantable Lead Implant Date: 20091103
Implantable Lead Implant Date: 20091103
Implantable Lead Location: 753859
Implantable Lead Location: 753860
Implantable Lead Model: 5076
Implantable Lead Model: 6947
Implantable Pulse Generator Implant Date: 20201028
Lead Channel Impedance Value: 342 Ohm
Lead Channel Impedance Value: 399 Ohm
Lead Channel Impedance Value: 399 Ohm
Lead Channel Pacing Threshold Amplitude: 0.375 V
Lead Channel Pacing Threshold Amplitude: 1.375 V
Lead Channel Pacing Threshold Pulse Width: 0.4 ms
Lead Channel Pacing Threshold Pulse Width: 0.4 ms
Lead Channel Sensing Intrinsic Amplitude: 1.5 mV
Lead Channel Sensing Intrinsic Amplitude: 1.5 mV
Lead Channel Sensing Intrinsic Amplitude: 3.625 mV
Lead Channel Sensing Intrinsic Amplitude: 3.625 mV
Lead Channel Setting Pacing Amplitude: 1.5 V
Lead Channel Setting Pacing Amplitude: 2.75 V
Lead Channel Setting Pacing Pulse Width: 0.6 ms
Lead Channel Setting Sensing Sensitivity: 0.3 mV

## 2021-06-24 LAB — BASIC METABOLIC PANEL
Anion gap: 8 (ref 5–15)
BUN: 29 mg/dL — ABNORMAL HIGH (ref 8–23)
CO2: 25 mmol/L (ref 22–32)
Calcium: 9.1 mg/dL (ref 8.9–10.3)
Chloride: 104 mmol/L (ref 98–111)
Creatinine, Ser: 2.4 mg/dL — ABNORMAL HIGH (ref 0.61–1.24)
GFR, Estimated: 27 mL/min — ABNORMAL LOW (ref >60)
Glucose, Bld: 119 mg/dL — ABNORMAL HIGH (ref 70–99)
Potassium: 4.5 mmol/L (ref 3.5–5.1)
Sodium: 137 mmol/L (ref 135–145)

## 2021-06-24 LAB — LACTATE DEHYDROGENASE: LDH: 233 U/L — ABNORMAL HIGH (ref 98–192)

## 2021-06-24 LAB — PROTIME-INR
INR: 3.6 — ABNORMAL HIGH (ref 0.8–1.2)
Prothrombin Time: 35.6 seconds — ABNORMAL HIGH (ref 11.4–15.2)

## 2021-06-24 NOTE — Patient Instructions (Signed)
Medication Instructions:  Your physician recommends that you continue on your current medications as directed. Please refer to the Current Medication list given to you today.  *If you need a refill on your cardiac medications before your next appointment, please call your pharmacy*   Lab Work: None If you have labs (blood work) drawn today and your tests are completely normal, you will receive your results only by: [place] (if you have MyChart) OR A paper copy in the mail If you have any lab test that is abnormal or we need to change your treatment, we will call you to review the results.   Follow-Up: At [facility], you and your health needs are our priority.  As part of our continuing mission to provide you with exceptional heart care, we have created designated Provider Care Teams.  These Care Teams include your primary Cardiologist (physician) and Advanced Practice Providers (APPs -  Physician Assistants and Nurse Practitioners) who all work together to provide you with the care you need, when you need it.   Your next appointment:   4 week(s)  The format for your next appointment:   In Person  Provider:   You may see Will Meredith Leeds, MD or one of the following Advanced Practice Providers on your designated Care Team:   Tommye Standard, [place] Legrand Como "[facility]" [place], [place]

## 2021-06-24 NOTE — Progress Notes (Signed)
LVAD INR 

## 2021-06-24 NOTE — Progress Notes (Signed)
VAD CLINIC NOTE   HPI:  Johnny Oneill is a 80 y.o. 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    LVAD Issues  Elevated LDH--bivalirudin. D/C LDH 188  Presented to ER on 06/27/19 with syncopal episode and head injury from fall. ECG showed VT @ 240. Underwent emergent DC-CV in ER. Head CT with no bleed. Was admitted and had ICD gen replacement (EOL). Had small hematoma at site. Amio started. Post-discharge found to have episode of AFL on ICD remote check but has since resolved.    Had ICD shock for VT on 06/09/20. Started on sotalol   On 02/06/21 Paced out of AFL.  Seen in ER 05/2021 due to LOW FLOW alarms. Found to be in VT rate 170. DCCV x 1 - returned to [facility] with frequent PVCs. Pacer settings adjusted by [name] PA- DDD 90. Post cardioversion persistent LOW FLOWS noted. Received 2 L IVF. Low flows resolved, and he was discharged home.   He presents today for ER f/u. Still having LOW FLOWS. Says he feels better but feels bloated. Denies dizziness or lightheadedness. No bleeding. Doppler BP at home yesterday was 120. Took Lasix 40 mg yesterday as instructed. Good urine output noted. Denies orthopnea or PND. No fevers, chills. Notes driveline exit site is a little red. No bleeding, melena or neuro symptoms.    VAD Indication: Destination therapy- patient choice     VAD interrogation & Equipment Management: Speed: 9400 Flow: 4.3 Power: 5.6w    PI: 5.5   Alarms: 25 asymptomatic LOW FLOWs since 0400 this morning Events: 100 +   Fixed speed 9600  Low speed limit: 9000   Primary Controller:  Replace back up battery in 16 months Back up controller:  Replace back up battery in 29 months -- did not bring today   Annual Equipment Maintenance on UBC/PM was performed on 10/2020.    Past Medical History:  Diagnosis Date   Anxiety    Automatic implantable cardioverter-defibrillator in situ     CAD (coronary artery disease)    S/P stenting of LCX in 2004;  s/p Lat MI 2009 with occlusion of the LCX - treated with Promus stenting. Has total occlusion of the RCA.    CHF (congestive heart failure) (HCC)    Hypercholesteremia    Hypertension    Hypothyroidism    ICD (implantable cardiac defibrillator) in place    PROPHYLACTIC      medtronic   Inferior MI ([place]) "? date"; 2009   Ischemic cardiomyopathy    a. 10/09 Echo: Sev LV Dysfxn, inf/lat AK, Mod MR.   Liver disease    LVAD (left ventricular assist device) present ([place])    Osteoarthritis    a. s/p R TKR   Pacemaker    PVC (premature ventricular contraction)    RBBB    Shortness of breath     Current Outpatient Medications  Medication Sig Dispense Refill   acetaminophen (TYLENOL) 500 MG tablet Take 500-1,000 mg by mouth every 6 (six) hours as needed (pain).     aspirin EC 81 MG tablet Take 81 mg by mouth in the morning.     cholecalciferol (VITAMIN D3) 25 MCG (1000 UT) tablet Take 1,000 Units by mouth in the morning.     citalopram (CELEXA) 10 MG tablet Take 1 tablet by mouth once daily (Patient taking differently: Take 10 mg by mouth daily.) 90  tablet 3   Coenzyme Q10 (COQ10) 200 MG CAPS Take 200 mg by mouth in the morning.     diphenhydrAMINE (SOMINEX) 25 MG tablet Take 25 mg by mouth at bedtime as needed for sleep.     doxycycline (VIBRAMYCIN) 100 MG capsule Take 1 capsule (100 mg total) by mouth 2 (two) times daily for 7 days. 14 capsule 0   doxylamine, Sleep, (UNISOM) 25 MG tablet Take 25 mg by mouth at bedtime as needed for sleep.     famotidine (PEPCID) 20 MG tablet Take 20 mg by mouth in the morning.     furosemide (LASIX) 20 MG tablet Take 1 tablet (20 mg total) by mouth daily as needed (Take 20 mg as daily as needed for wt > 153 lbs). Reported on 01/14/2016 (Patient taking differently: Take 20 mg by mouth daily as needed (Take 20 mg as daily as needed for wt > 153 lbs).) 30 tablet 6   levothyroxine (EUTHYROX) 137 MCG  tablet Take 1 tablet (137 mcg total) by mouth daily before breakfast. 90 tablet 1   melatonin 5 MG TABS Take 5 mg by mouth at bedtime as needed (sleep).     mexiletine (MEXITIL) 150 MG capsule Take 1 capsule (150 mg total) by mouth 2 (two) times daily. 60 capsule 6   pantoprazole (PROTONIX) 40 MG tablet Take 1 tablet (40 mg total) by mouth daily. 90 tablet 3   potassium chloride SA (KLOR-CON) 20 MEQ tablet Take 1 tablet (20 mEq total) by mouth daily as needed. Take one potassium pill when you take your lasix. (Patient taking differently: Take 20 mEq by mouth daily as needed (when taking furosemide for weight gain).) 30 tablet 6   Probiotic Product (PROBIOTIC PO) Take 1 capsule by mouth in the morning.     rosuvastatin (CRESTOR) 20 MG tablet Take 1 tablet (20 mg total) by mouth daily. 90 tablet 3   sotalol (BETAPACE) 80 MG tablet TAKE 1 TABLET (80 MG TOTAL) BY MOUTH DAILY. (Patient taking differently: Take 80 mg by mouth daily.) 90 tablet 3   traZODone (DESYREL) 50 MG tablet Take 1 tablet (50 mg total) by mouth at bedtime. (Patient taking differently: Take 50 mg by mouth at bedtime as needed for sleep.) 30 tablet 5   warfarin (COUMADIN) 5 MG tablet Take 5 mg (1 tablet) every Monday/Friday and 7.5 mg (1.5 tablets) all other days OR as directed by HF Clinic (Patient taking differently: Take 5-7.5 mg by mouth See admin instructions. Take 5 mg on Monday and Friday 7.5 mg on Tuesday,Wednesday,Thursday,Saturday and sunday) 135 tablet 11   traMADol (ULTRAM) 50 MG tablet Take 1-2 tablets (50-100 mg total) by mouth every 6 (six) hours as needed for moderate pain. (Patient not taking: Reported on 06/19/2021) 45 tablet 5   No current facility-administered medications for this encounter.   Facility-Administered Medications Ordered in Other Encounters  Medication Dose Route Frequency Provider Last Rate Last Admin   etomidate (AMIDATE) injection    Anesthesia Intra-op Myna Bright, CRNA   20 mg at 02/08/14  0915   rocuronium [facility]) injection    Anesthesia Intra-op Myna Bright, CRNA   50 mg at 02/08/14 0932   succinylcholine (ANECTINE) injection    Anesthesia Intra-op Myna Bright, CRNA   100 mg at 02/08/14 0915   Ace inhibitors, Amiodarone, Diovan [valsartan], Lipitor [atorvastatin calcium], Jardiance [empagliflozin], and Norvasc [amlodipine]  Review of systems complete and found to be negative unless listed in HPI.  Vital Signs:  Doppler Pressure: 84 Vitals:   06/19/21 1020  BP: (!) 135/0  Pulse: (!) 118  SpO2: 97%      Vital Signs:   Temp:  Doppler Pressure: 135 Automatc BP:              Laying: 117/83 (98)  HR 118 -- slowed to 90             Sitting: 122/97 (110)  HR 89              Standing: 117/90 (103)  HR 90 HR: DDD 90 w/ occasional PVCs. Periodically increases to 118 - 120 SPO2: 97% on RA   Weight: lb w/ eqt Last weight: 173.8 lb  Physical Exam: General:  NAD.  HEENT: normal  Neck: supple. JVP 7-8.  Carotids 2+ bilat; no bruits. No lymphadenopathy or thryomegaly appreciated. Cor: LVAD hum.  Lungs: Clear. Abdomen: obese soft, nontender, non-distended. No hepatosplenomegaly. No bruits or masses. Good bowel sounds. Driveline site with mild erythema. No drainage or tenderness  Extremities: no cyanosis, clubbing, rash. Warm no edema  Neuro: alert & oriented x 3. No focal deficits. 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  - Off amio due to cerebellar side effects.  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 Now on sotalol. QTc ok on ECG.  - Seen in ER over the weekend with recurrent VT. DC-CV x 1 - Will add mexilitene 150 bid - Keep K > 4.0 Mg > 2.0 - has f/u with EP next week.   2) Chronic systolic HF: ICM, EF 16-94% s/p LVAD HM II implant 08/2013 for DT.   - Echo 3/18 EF 10-15% mild AI. Mod RV dysfunction. LVAD cannula OK - has significant RV failure - Stable NYHA II-early III. Volume status mildly  elevated after receiving fluids in ER. Ok to take lasix today - Otherwise remains off all diuretics including spiro - Failed Jardiance due to volume depletion - He has not tolerated ACE-I or amlodipine or b-blocker in the past.  3) LVAD: HMII 08/2013 for DT. - VAD interrogated personally. Multiple low flow alarms. Rhythm stable. No evidence of bleeding. If persist will need ramp echo.  - Admitted in May 2018 with elevated LDH with bival.  - Continue aspirin and coumadin.  - Hgb stable at 13.1 - LDH 294 - DL site  mildly irritate. Add doxy.  - MAPs ok. Hydrlalzine recently stopped due to orthostasis.   4) Atrial flutter, recurrent - patient paced out of AFL in 6/22. Atrial therapies enabled  - continue sotalol (on for VT) - Continue warfarin INR 3.2. Discussed dosing with PharmD personally. - may need to consider ablation if recurs  5) Chronic anticoagulation: INR goal 2.0-3.0.    - INR 3.2 Discussed dosing with PharmD personally. - Continue ASA 81 mg for LVAD + warfarin.   6) HTN:  - MAPs stable. Hydrllazine recently stopped due to orthostasis. Improved   7) Hypothyroidism:  - Followed by Dr Dwyane Dee. Stable.No change   8) Hyperlipidemia:  - Continue Crestor. Zetia stopped due to cost.  - No change.   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 Liver u/s ok - labs ok  13) R knee OA - previously had long discussion about risks/benefits  of R TKA in setting of VAD support - would be relatively high risk with his RV failure but if was life-limiting symptoms we would certainly support him through it (once rhythm more stable)  Total time spent 35 minutes. Over half that time spent discussing above.   Glori Bickers, MD  7:43 AM

## 2021-06-24 NOTE — Telephone Encounter (Signed)
Called pt to discuss lab work today. Pt informed that CR is up slightly to 2.4 this week from 2.24 last week. D/w Dr. Haroldine Laws and pt instructed that we need to repeat bmet next week. Pt agreeable. Labs ordered and scheduled.   Tanda Rockers RN, BSN VAD Coordinator 24/7 Pager [phone]

## 2021-06-25 ENCOUNTER — Encounter (HOSPITAL_COMMUNITY): Payer: Medicare Other

## 2021-06-26 ENCOUNTER — Other Ambulatory Visit (HOSPITAL_COMMUNITY): Payer: Self-pay | Admitting: *Deleted

## 2021-06-26 ENCOUNTER — Ambulatory Visit (INDEPENDENT_AMBULATORY_CARE_PROVIDER_SITE_OTHER): Payer: Medicare Other

## 2021-06-26 DIAGNOSIS — Z95811 Presence of heart assist device: Secondary | ICD-10-CM

## 2021-06-26 DIAGNOSIS — I472 Ventricular tachycardia, unspecified: Secondary | ICD-10-CM

## 2021-06-26 DIAGNOSIS — Z7901 Long term (current) use of anticoagulants: Secondary | ICD-10-CM

## 2021-06-26 LAB — CUP PACEART REMOTE DEVICE CHECK
Battery Remaining Longevity: 84 mo
Battery Voltage: 2.97 V
Brady Statistic AP VP Percent: 88.43 %
Brady Statistic AP VS Percent: 6.59 %
Brady Statistic AS VP Percent: 4.95 %
Brady Statistic AS VS Percent: 0.03 %
Brady Statistic RA Percent Paced: 94.89 %
Brady Statistic RV Percent Paced: 93.39 %
Date Time Interrogation Session: 20221027033324
HighPow Impedance: 36 Ohm
HighPow Impedance: 43 Ohm
Implantable Lead Implant Date: 20091103
Implantable Lead Implant Date: 20091103
Implantable Lead Location: 753859
Implantable Lead Location: 753860
Implantable Lead Model: 5076
Implantable Lead Model: 6947
Implantable Pulse Generator Implant Date: 20201028
Lead Channel Impedance Value: 304 Ohm
Lead Channel Impedance Value: 361 Ohm
Lead Channel Impedance Value: 361 Ohm
Lead Channel Pacing Threshold Amplitude: 0.375 V
Lead Channel Pacing Threshold Amplitude: 1.25 V
Lead Channel Pacing Threshold Pulse Width: 0.4 ms
Lead Channel Pacing Threshold Pulse Width: 0.4 ms
Lead Channel Sensing Intrinsic Amplitude: 1.75 mV
Lead Channel Sensing Intrinsic Amplitude: 1.75 mV
Lead Channel Sensing Intrinsic Amplitude: 5 mV
Lead Channel Sensing Intrinsic Amplitude: 5 mV
Lead Channel Setting Pacing Amplitude: 1.5 V
Lead Channel Setting Pacing Amplitude: 2.75 V
Lead Channel Setting Pacing Pulse Width: 0.6 ms
Lead Channel Setting Sensing Sensitivity: 0.3 mV

## 2021-07-01 ENCOUNTER — Other Ambulatory Visit: Payer: Self-pay

## 2021-07-01 ENCOUNTER — Ambulatory Visit (HOSPITAL_COMMUNITY): Payer: Self-pay | Admitting: Pharmacist

## 2021-07-01 ENCOUNTER — Ambulatory Visit (HOSPITAL_COMMUNITY)
Admission: RE | Admit: 2021-07-01 | Discharge: 2021-07-01 | Disposition: A | Discharge status: Home / Self Care | Payer: Medicare Other | Source: Ambulatory Visit | Attending: Cardiology | Admitting: Cardiology

## 2021-07-01 DIAGNOSIS — Z7901 Long term (current) use of anticoagulants: Secondary | ICD-10-CM | POA: Diagnosis not present

## 2021-07-01 DIAGNOSIS — Z09 Encounter for follow-up examination after completed treatment for conditions other than malignant neoplasm: Secondary | ICD-10-CM | POA: Diagnosis not present

## 2021-07-01 DIAGNOSIS — Z95811 Presence of heart assist device: Secondary | ICD-10-CM | POA: Diagnosis not present

## 2021-07-01 LAB — BASIC METABOLIC PANEL
Anion gap: 9 (ref 5–15)
BUN: 31 mg/dL — ABNORMAL HIGH (ref 8–23)
CO2: 26 mmol/L (ref 22–32)
Calcium: 9.6 mg/dL (ref 8.9–10.3)
Chloride: 103 mmol/L (ref 98–111)
Creatinine, Ser: 2.39 mg/dL — ABNORMAL HIGH (ref 0.61–1.24)
GFR, Estimated: 27 mL/min — ABNORMAL LOW (ref >60)
Glucose, Bld: 112 mg/dL — ABNORMAL HIGH (ref 70–99)
Potassium: 4.3 mmol/L (ref 3.5–5.1)
Sodium: 138 mmol/L (ref 135–145)

## 2021-07-01 LAB — PROTIME-INR
INR: 3.7 — ABNORMAL HIGH (ref 0.8–1.2)
Prothrombin Time: 36.5 seconds — ABNORMAL HIGH (ref 11.4–15.2)

## 2021-07-01 NOTE — Progress Notes (Signed)
LVAD INR 

## 2021-07-02 ENCOUNTER — Other Ambulatory Visit (HOSPITAL_COMMUNITY): Payer: Medicare Other

## 2021-07-04 ENCOUNTER — Other Ambulatory Visit (HOSPITAL_COMMUNITY): Payer: Self-pay | Admitting: *Deleted

## 2021-07-04 DIAGNOSIS — Z95811 Presence of heart assist device: Secondary | ICD-10-CM

## 2021-07-04 DIAGNOSIS — Z7901 Long term (current) use of anticoagulants: Secondary | ICD-10-CM

## 2021-07-04 NOTE — Progress Notes (Signed)
Remote ICD transmission.   

## 2021-07-08 ENCOUNTER — Other Ambulatory Visit (HOSPITAL_COMMUNITY): Payer: Medicare Other

## 2021-07-09 ENCOUNTER — Telehealth (HOSPITAL_COMMUNITY): Payer: Self-pay | Admitting: Unknown Physician Specialty

## 2021-07-09 ENCOUNTER — Ambulatory Visit (HOSPITAL_COMMUNITY)
Admission: RE | Admit: 2021-07-09 | Discharge: 2021-07-09 | Disposition: A | Discharge status: Home / Self Care | Payer: Medicare Other | Source: Ambulatory Visit | Attending: Cardiology | Admitting: Cardiology

## 2021-07-09 ENCOUNTER — Other Ambulatory Visit: Payer: Self-pay

## 2021-07-09 ENCOUNTER — Ambulatory Visit (HOSPITAL_COMMUNITY): Payer: Self-pay | Admitting: Pharmacist

## 2021-07-09 DIAGNOSIS — Z5181 Encounter for therapeutic drug level monitoring: Secondary | ICD-10-CM | POA: Insufficient documentation

## 2021-07-09 DIAGNOSIS — Z7901 Long term (current) use of anticoagulants: Secondary | ICD-10-CM | POA: Diagnosis not present

## 2021-07-09 DIAGNOSIS — Z95811 Presence of heart assist device: Secondary | ICD-10-CM | POA: Diagnosis not present

## 2021-07-09 LAB — BASIC METABOLIC PANEL
Anion gap: 7 (ref 5–15)
BUN: 29 mg/dL — ABNORMAL HIGH (ref 8–23)
CO2: 25 mmol/L (ref 22–32)
Calcium: 9.1 mg/dL (ref 8.9–10.3)
Chloride: 103 mmol/L (ref 98–111)
Creatinine, Ser: 2.6 mg/dL — ABNORMAL HIGH (ref 0.61–1.24)
GFR, Estimated: 25 mL/min — ABNORMAL LOW (ref >60)
Glucose, Bld: 150 mg/dL — ABNORMAL HIGH (ref 70–99)
Potassium: 4.5 mmol/L (ref 3.5–5.1)
Sodium: 135 mmol/L (ref 135–145)

## 2021-07-09 LAB — LACTATE DEHYDROGENASE: LDH: 247 U/L — ABNORMAL HIGH (ref 98–192)

## 2021-07-09 LAB — PROTIME-INR
INR: 3.6 — ABNORMAL HIGH (ref 0.8–1.2)
Prothrombin Time: 35.8 seconds — ABNORMAL HIGH (ref 11.4–15.2)

## 2021-07-09 NOTE — Progress Notes (Signed)
LVAD INR 

## 2021-07-09 NOTE — Telephone Encounter (Signed)
Called pt to discuss lab work today. Pt informed that CR is up again this week to 2.6 this week from 2.39 last week. D/w Dr. Haroldine Laws and pt instructed that we need to repeat bmet next week. Pt agreeable. Labs ordered and scheduled. Pt denies taking any Lasix. Pt encouraged to stay hydrated.   Tanda Rockers RN, BSN VAD Coordinator 24/7 Pager [phone]

## 2021-07-10 ENCOUNTER — Other Ambulatory Visit (HOSPITAL_COMMUNITY): Payer: Self-pay | Admitting: *Deleted

## 2021-07-10 DIAGNOSIS — Z7901 Long term (current) use of anticoagulants: Secondary | ICD-10-CM

## 2021-07-10 DIAGNOSIS — Z95811 Presence of heart assist device: Secondary | ICD-10-CM

## 2021-07-10 DIAGNOSIS — I5022 Chronic systolic (congestive) heart failure: Secondary | ICD-10-CM

## 2021-07-16 ENCOUNTER — Telehealth (HOSPITAL_COMMUNITY): Payer: Self-pay | Admitting: Unknown Physician Specialty

## 2021-07-16 ENCOUNTER — Ambulatory Visit (HOSPITAL_COMMUNITY)
Admission: RE | Admit: 2021-07-16 | Discharge: 2021-07-16 | Disposition: A | Discharge status: Home / Self Care | Payer: Medicare Other | Source: Ambulatory Visit | Attending: Family Medicine | Admitting: Family Medicine

## 2021-07-16 ENCOUNTER — Ambulatory Visit (HOSPITAL_COMMUNITY): Payer: Self-pay | Admitting: Pharmacist

## 2021-07-16 DIAGNOSIS — I5022 Chronic systolic (congestive) heart failure: Secondary | ICD-10-CM | POA: Insufficient documentation

## 2021-07-16 DIAGNOSIS — Z95811 Presence of heart assist device: Secondary | ICD-10-CM | POA: Insufficient documentation

## 2021-07-16 DIAGNOSIS — Z7901 Long term (current) use of anticoagulants: Secondary | ICD-10-CM | POA: Diagnosis not present

## 2021-07-16 LAB — COMPREHENSIVE METABOLIC PANEL
ALT: 14 U/L (ref 0–44)
AST: 23 U/L (ref 15–41)
Albumin: 3.9 g/dL (ref 3.5–5.0)
Alkaline Phosphatase: 79 U/L (ref 38–126)
Anion gap: 9 (ref 5–15)
BUN: 32 mg/dL — ABNORMAL HIGH (ref 8–23)
CO2: 25 mmol/L (ref 22–32)
Calcium: 9.5 mg/dL (ref 8.9–10.3)
Chloride: 102 mmol/L (ref 98–111)
Creatinine, Ser: 2.49 mg/dL — ABNORMAL HIGH (ref 0.61–1.24)
GFR, Estimated: 26 mL/min — ABNORMAL LOW (ref >60)
Glucose, Bld: 102 mg/dL — ABNORMAL HIGH (ref 70–99)
Potassium: 4.7 mmol/L (ref 3.5–5.1)
Sodium: 136 mmol/L (ref 135–145)
Total Bilirubin: 0.8 mg/dL (ref 0.3–1.2)
Total Protein: 7.3 g/dL (ref 6.5–8.1)

## 2021-07-16 LAB — PROTIME-INR
INR: 3.6 — ABNORMAL HIGH (ref 0.8–1.2)
Prothrombin Time: 35.8 seconds — ABNORMAL HIGH (ref 11.4–15.2)

## 2021-07-16 LAB — LACTATE DEHYDROGENASE: LDH: 272 U/L — ABNORMAL HIGH (ref 98–192)

## 2021-07-16 NOTE — Progress Notes (Signed)
LVAD INR 

## 2021-07-16 NOTE — Telephone Encounter (Signed)
Called pt to inform him of lab results. Pt informed that CR came down slightly. D/w Dr. Haroldine Laws, pt informed that we will discuss options at his clinic visit next week. Pt is agreeable. Pt reminded to bring his back up controller so that the VAD coord can program back up speeds to match his current speed. Pt verbalized understanding.  Tanda Rockers RN, BSN VAD Coordinator 24/7 Pager [phone]

## 2021-07-21 ENCOUNTER — Other Ambulatory Visit (HOSPITAL_COMMUNITY): Payer: Self-pay | Admitting: *Deleted

## 2021-07-21 DIAGNOSIS — I5022 Chronic systolic (congestive) heart failure: Secondary | ICD-10-CM

## 2021-07-21 DIAGNOSIS — Z95811 Presence of heart assist device: Secondary | ICD-10-CM

## 2021-07-21 DIAGNOSIS — Z7901 Long term (current) use of anticoagulants: Secondary | ICD-10-CM

## 2021-07-21 DIAGNOSIS — I472 Ventricular tachycardia, unspecified: Secondary | ICD-10-CM

## 2021-07-22 ENCOUNTER — Other Ambulatory Visit (HOSPITAL_COMMUNITY): Payer: Self-pay | Admitting: *Deleted

## 2021-07-22 ENCOUNTER — Ambulatory Visit (HOSPITAL_COMMUNITY)
Admission: RE | Admit: 2021-07-22 | Discharge: 2021-07-22 | Disposition: A | Discharge status: Home / Self Care | Payer: Medicare Other | Source: Ambulatory Visit | Attending: Internal Medicine | Admitting: Internal Medicine

## 2021-07-22 ENCOUNTER — Ambulatory Visit (HOSPITAL_COMMUNITY): Payer: Self-pay | Admitting: Pharmacist

## 2021-07-22 ENCOUNTER — Encounter (HOSPITAL_COMMUNITY): Payer: Self-pay

## 2021-07-22 ENCOUNTER — Other Ambulatory Visit: Payer: Self-pay

## 2021-07-22 VITALS — BP 122/88 | Temp 97.5°F | Wt 170.6 lb

## 2021-07-22 DIAGNOSIS — N1832 Chronic kidney disease, stage 3b: Secondary | ICD-10-CM | POA: Insufficient documentation

## 2021-07-22 DIAGNOSIS — I4892 Unspecified atrial flutter: Secondary | ICD-10-CM | POA: Insufficient documentation

## 2021-07-22 DIAGNOSIS — M1711 Unilateral primary osteoarthritis, right knee: Secondary | ICD-10-CM | POA: Diagnosis not present

## 2021-07-22 DIAGNOSIS — I130 Hypertensive heart and chronic kidney disease with heart failure and stage 1 through stage 4 chronic kidney disease, or unspecified chronic kidney disease: Secondary | ICD-10-CM | POA: Insufficient documentation

## 2021-07-22 DIAGNOSIS — I428 Other cardiomyopathies: Secondary | ICD-10-CM | POA: Diagnosis not present

## 2021-07-22 DIAGNOSIS — I472 Ventricular tachycardia, unspecified: Secondary | ICD-10-CM

## 2021-07-22 DIAGNOSIS — I5022 Chronic systolic (congestive) heart failure: Secondary | ICD-10-CM

## 2021-07-22 DIAGNOSIS — I2510 Atherosclerotic heart disease of native coronary artery without angina pectoris: Secondary | ICD-10-CM | POA: Diagnosis not present

## 2021-07-22 DIAGNOSIS — I493 Ventricular premature depolarization: Secondary | ICD-10-CM | POA: Diagnosis not present

## 2021-07-22 DIAGNOSIS — I50810 Right heart failure, unspecified: Secondary | ICD-10-CM

## 2021-07-22 DIAGNOSIS — Z7901 Long term (current) use of anticoagulants: Secondary | ICD-10-CM

## 2021-07-22 DIAGNOSIS — E7800 Pure hypercholesterolemia, unspecified: Secondary | ICD-10-CM | POA: Diagnosis not present

## 2021-07-22 DIAGNOSIS — R7401 Elevation of levels of liver transaminase levels: Secondary | ICD-10-CM | POA: Insufficient documentation

## 2021-07-22 DIAGNOSIS — E039 Hypothyroidism, unspecified: Secondary | ICD-10-CM | POA: Insufficient documentation

## 2021-07-22 DIAGNOSIS — Z95811 Presence of heart assist device: Secondary | ICD-10-CM

## 2021-07-22 LAB — COMPREHENSIVE METABOLIC PANEL
ALT: 18 U/L (ref 0–44)
AST: 23 U/L (ref 15–41)
Albumin: 4.1 g/dL (ref 3.5–5.0)
Alkaline Phosphatase: 77 U/L (ref 38–126)
Anion gap: 11 (ref 5–15)
BUN: 31 mg/dL — ABNORMAL HIGH (ref 8–23)
CO2: 26 mmol/L (ref 22–32)
Calcium: 9.3 mg/dL (ref 8.9–10.3)
Chloride: 102 mmol/L (ref 98–111)
Creatinine, Ser: 2.75 mg/dL — ABNORMAL HIGH (ref 0.61–1.24)
GFR, Estimated: 23 mL/min — ABNORMAL LOW (ref >60)
Glucose, Bld: 109 mg/dL — ABNORMAL HIGH (ref 70–99)
Potassium: 4.6 mmol/L (ref 3.5–5.1)
Sodium: 139 mmol/L (ref 135–145)
Total Bilirubin: 0.9 mg/dL (ref 0.3–1.2)
Total Protein: 7.3 g/dL (ref 6.5–8.1)

## 2021-07-22 LAB — CBC
HCT: 37.8 % — ABNORMAL LOW (ref 39.0–52.0)
Hemoglobin: 12.3 g/dL — ABNORMAL LOW (ref 13.0–17.0)
MCH: 30.4 pg (ref 26.0–34.0)
MCHC: 32.5 g/dL (ref 30.0–36.0)
MCV: 93.6 fL (ref 80.0–100.0)
Platelets: 198 10*3/uL (ref 150–400)
RBC: 4.04 MIL/uL — ABNORMAL LOW (ref 4.22–5.81)
RDW: 14.8 % (ref 11.5–15.5)
WBC: 6.4 10*3/uL (ref 4.0–10.5)
nRBC: 0 % (ref 0.0–0.2)

## 2021-07-22 LAB — LACTATE DEHYDROGENASE: LDH: 221 U/L — ABNORMAL HIGH (ref 98–192)

## 2021-07-22 LAB — PROTIME-INR
INR: 3.2 — ABNORMAL HIGH (ref 0.8–1.2)
Prothrombin Time: 32.8 seconds — ABNORMAL HIGH (ref 11.4–15.2)

## 2021-07-22 LAB — MAGNESIUM: Magnesium: 1.9 mg/dL (ref 1.7–2.4)

## 2021-07-22 MED ORDER — SOTALOL HCL 80 MG PO TABS: ORAL_TABLET | Freq: Every day | ORAL | 3 refills | Status: DC

## 2021-07-22 NOTE — Patient Instructions (Signed)
No medication changes today Coumadin dosing per Lauren PharmD Return to [place] clinic in 2 months for follow up with Dr Haroldine Laws. We will complete your 8 yr Intermacs and annual maintenance of your equipment at that visit. Please bring your battery charger, mobile power unit, power module, back up bag, and batteries for maintenance.  We will get you scheduled for right heart cath with Dr Haroldine Laws

## 2021-07-22 NOTE — Progress Notes (Addendum)
Patient presents for 1 month f/u in [facility] today alone. Reports no problems with VAD equipment or concerns with drive line.   Pt walked into clinic unassisted today. Denies lightheadedness, falls, and signs of bleeding. Reports occasional dizziness when bending over. Reports shortness of breath when walking far distances.   No low flows seen on VAD interrogation. Remains v-paced. Last visit he was started on Mexiletine 150 mg BID. Remains on Sotalol 80 mg daily. EKG completed today and reviewed by Dr Haroldine Laws.  Creatinine has been elevated 2.4 - 2.6 over the last month. Creatinine 2.75 today. Will continue Sotalol and Mexiletine at this time per Dr Haroldine Laws. Refill sent in for Sotalol to pt's pharmacy. Scheduled for RHC next week to assess RV function.   He has not needed any PRN Lasix since last visit.   Back up controller programmed to match primary controller: 9600 / 9000.   Vital Signs:   Temp: 97.5 Doppler Pressure: 130 Automatc BP: 122/88 (100) HR: 80 v-paced SPO2: UTO % on RA   Weight: 170.6 lb w/ eqt Last weight: 173.8 lb  VAD Indication: Destination therapy- patient choice     VAD interrogation & Equipment Management: Speed: 9600 Flow: 3.9 Power: 5.5 w    PI: 7.1   Alarms: few low voltage advisories Events: 10 - 25 daily   Fixed speed 9600  Low speed limit: 9000   Primary Controller:  Replace back up battery in 15 months Back up controller:  Replace back up battery in 29 months -- did not bring today   Annual Equipment Maintenance on UBC/PM was performed on 10/2020.   I reviewed the LVAD parameters from today and compared the results to the patient's prior recorded data. LVAD interrogation was NEGATIVE for significant power changes, NEGATIVE for clinical alarms and STABLE for PI events/speed drops. No programming changes were made and pump is functioning within specified parameters. Pt is performing daily controller and system monitor self tests along with  completing weekly and monthly maintenance for LVAD equipment.  Exit Site Care: Drive line is being maintained weekly by [name], his wife. Pt has CHG allergy. Dressing intact with Silvalon patch in place. Rash resolved. Stabilization device present and accurately applied.  Pt denies fever or chills. Provided with 8 weekly kits for home use.    Significant Events on VAD Support:  10/2013>SBO 12/2016> elevated LDH, admit for Bival   Device: Dual Medtronic Therapies:  V therapy on at 222 bpm; V monitor on 171         A therapy on 171 bpm (ramp and burst overdrive pacing);         A monitor on 300 bpm  Pacing: AAI<=>DDD 80 bpm (increased from 60 on 04/20/21) Last check: 03/27/21  BP & Labs:  Doppler 130 - Doppler is reflecting modified systolic  Hgb 36.1 - No S/S of bleeding. Specifically denies melena/BRBPR or nosebleeds.   LDH 221 - established baseline of 200- 350. Denies tea-colored urine. No power elevations noted on interrogation.   Patient Instructions:  No medication changes today Coumadin dosing per Lauren PharmD Return to [place] clinic in 2 months for follow up with Dr Haroldine Laws. We will complete your 8 yr Intermacs and annual maintenance of your equipment at that visit. Please bring your battery charger, mobile power unit, power module, back up bag, and batteries for maintenance.  We will get you scheduled for right heart cath with Dr Haroldine Laws  Emerson Monte RN [place] Coordinator  Office: [phone]  24/7  Pager: [phone]

## 2021-07-22 NOTE — Progress Notes (Signed)
LVAD INR 

## 2021-07-26 NOTE — H&P (View-Only) (Signed)
VAD CLINIC NOTE   HPI:  Johnny Oneill is a 80 y.o. 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    LVAD Issues  Elevated LDH--bivalirudin. D/C LDH 188  Presented to ER on 06/27/19 with syncopal episode and head injury from fall. ECG showed VT @ 240. Underwent emergent DC-CV in ER. Head CT with no bleed. Was admitted and had ICD gen replacement (EOL). Had small hematoma at site. Amio started. Post-discharge found to have episode of AFL on ICD remote check but has since resolved.    Had ICD shock for VT on 06/09/20. Started on sotalol   On 02/06/21 Paced out of AFL.  Seen in ER 05/2021 due to LOW FLOW alarms. Found to be in VT rate 170. DCCV x 1 - returned to [place] with frequent PVCs. Pacer settings adjusted by [name] PA- DDD 90. Post cardioversion persistent LOW FLOWS noted. Received 2 L IVF. Low flows resolved, and he was discharged home.   He presents today for routine f/u. Last visit started on mexilitene for PVCs. Feels good. Remains active. Denies orthopnea or PND. No fevers, chills or problems with driveline. No bleeding, melena or neuro symptoms. No VAD alarms. Taking all meds as prescribed.    VAD Indication: Destination therapy- patient choice     VAD interrogation & Equipment Management: Speed: 9600 Flow: 3.9 Power: 5.5 w    PI: 7.1   Alarms: few low voltage advisories Events: 10 - 25 daily   Fixed speed 9600  Low speed limit: 9000   Primary Controller:  Replace back up battery in 15 months Back up controller:  Replace back up battery in 29 months -- did not bring today   Annual Equipment Maintenance on UBC/PM was performed on 10/2020.   I reviewed the LVAD parameters from today and compared the results to the patient's prior recorded data. LVAD interrogation was NEGATIVE for significant power changes, NEGATIVE for clinical alarms and STABLE for PI events/speed drops. No  programming changes were made and pump is functioning within specified parameters. Pt is performing daily controller and system monitor self tests along with completing weekly and monthly maintenance for LVAD equipment.    Past Medical History:  Diagnosis Date   Anxiety    Automatic implantable cardioverter-defibrillator in situ    CAD (coronary artery disease)    S/P stenting of LCX in 2004;  s/p Lat MI 2009 with occlusion of the LCX - treated with Promus stenting. Has total occlusion of the RCA.    CHF (congestive heart failure) (HCC)    Hypercholesteremia    Hypertension    Hypothyroidism    ICD (implantable cardiac defibrillator) in place    PROPHYLACTIC      medtronic   Inferior MI ([place]) "? date"; 2009   Ischemic cardiomyopathy    a. 10/09 Echo: Sev LV Dysfxn, inf/lat AK, Mod MR.   Liver disease    LVAD (left ventricular assist device) present ([place])    Osteoarthritis    a. s/p R TKR   Pacemaker    PVC (premature ventricular contraction)    RBBB    Shortness of breath     Current Outpatient Medications  Medication Sig Dispense Refill   acetaminophen (TYLENOL) 500 MG tablet Take 500-1,000 mg by mouth every 6 (six) hours as needed (pain).     aspirin EC 81 MG tablet Take 81 mg by mouth in  the morning.     cholecalciferol (VITAMIN D3) 25 MCG (1000 UT) tablet Take 1,000 Units by mouth in the morning.     citalopram (CELEXA) 10 MG tablet Take 1 tablet by mouth once daily 90 tablet 3   Coenzyme Q10 (COQ10) 200 MG CAPS Take 200 mg by mouth in the morning.     diphenhydrAMINE (SOMINEX) 25 MG tablet Take 25 mg by mouth at bedtime as needed for sleep.     famotidine (PEPCID) 20 MG tablet Take 20 mg by mouth in the morning.     levothyroxine (EUTHYROX) 137 MCG tablet Take 1 tablet (137 mcg total) by mouth daily before breakfast. 90 tablet 1   mexiletine (MEXITIL) 150 MG capsule Take 1 capsule (150 mg total) by mouth 2 (two) times daily. 60 capsule 6   pantoprazole (PROTONIX) 40 MG  tablet Take 1 tablet (40 mg total) by mouth daily. 90 tablet 3   Probiotic Product (PROBIOTIC PO) Take 1 capsule by mouth in the morning.     rosuvastatin (CRESTOR) 20 MG tablet Take 1 tablet (20 mg total) by mouth daily. 90 tablet 3   traZODone (DESYREL) 50 MG tablet Take 1 tablet (50 mg total) by mouth at bedtime. (Patient taking differently: Take 50 mg by mouth at bedtime as needed for sleep.) 30 tablet 5   warfarin (COUMADIN) 5 MG tablet Take 5 mg (1 tablet) every Monday/Friday and 7.5 mg (1.5 tablets) all other days OR as directed by HF Clinic (Patient taking differently: Take 5-7.5 mg by mouth See admin instructions. Take 1.5 tablets (7.5 mg) by mouth on Mondays & Fridays at night. Take 1 tablet (5 mg) by mouth on Sundays, Tuesdays, Wednesdays, Thursdays, & Saturdays.) 135 tablet 11   furosemide (LASIX) 20 MG tablet Take 1 tablet (20 mg total) by mouth daily as needed (Take 20 mg as daily as needed for wt > 153 lbs). Reported on 01/14/2016 30 tablet 6   ibuprofen (ADVIL) 200 MG tablet Take 200 mg by mouth every 6 (six) hours as needed (knee pain.).     melatonin 5 MG TABS Take 5 mg by mouth at bedtime as needed (sleep).     potassium chloride SA (KLOR-CON) 20 MEQ tablet Take 1 tablet (20 mEq total) by mouth daily as needed. Take one potassium pill when you take your lasix. 30 tablet 6   sotalol (BETAPACE) 80 MG tablet TAKE 1 TABLET (80 MG TOTAL) BY MOUTH DAILY. 90 tablet 3   traMADol (ULTRAM) 50 MG tablet Take 1-2 tablets (50-100 mg total) by mouth every 6 (six) hours as needed for moderate pain. (Patient taking differently: Take 50-100 mg by mouth every 6 (six) hours as needed for moderate pain (knee pain).) 45 tablet 5   No current facility-administered medications for this encounter.   Facility-Administered Medications Ordered in Other Encounters  Medication Dose Route Frequency Provider Last Rate Last Admin   etomidate (AMIDATE) injection    Anesthesia Intra-op Myna Bright, CRNA   20  mg at 02/08/14 0915   rocuronium [facility]) injection    Anesthesia Intra-op Myna Bright, CRNA   50 mg at 02/08/14 0932   succinylcholine (ANECTINE) injection    Anesthesia Intra-op Myna Bright, CRNA   100 mg at 02/08/14 0915   Ace inhibitors, Amiodarone, Diovan [valsartan], Lipitor [atorvastatin calcium], Jardiance [empagliflozin], and Norvasc [amlodipine]  Review of systems complete and found to be negative unless listed in HPI.      Vital Signs:  Doppler Pressure:  84 Vitals:   07/22/21 1032 07/22/21 1037  BP: (!) 126/95 122/88  Temp:     Vital Signs:   Temp: 97.5 Doppler Pressure: 130 Automatc BP: 122/88 (100) HR: 80 v-paced SPO2: UTO % on RA   Weight: 170.6 lb w/ eqt Last weight: 173.8 lb  General:  NAD.  HEENT: normal  Neck: supple. JVP 5-6.  Carotids 2+ bilat; no bruits. No lymphadenopathy or thryomegaly appreciated. Cor: LVAD hum.  Lungs: Clear. Abdomen: obese soft, nontender, non-distended. No hepatosplenomegaly. No bruits or masses. Good bowel sounds. Driveline site clean. Anchor in place.  Extremities: no cyanosis, clubbing, rash. Warm no edema  Neuro: alert & oriented x 3. No focal deficits. Moves all 4 without problem    ASSESSMENT AND PLAN:   1) VT/Frequent PVCs - now s/p ICD gen change.  - Off amio due to cerebellar side effects.  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 Now on sotalol. QTc stable on ECG - Seen in ER 06/16/21 with recurrent VT. DC-CV x 1 - Less PVCs today. Continue mexilitene 150 bid - Keep K > 4.0 Mg > 2.0 - Continue to f/u with EP  2) Chronic systolic HF: ICM, EF 16-10% s/p LVAD HM II implant 08/2013 for DT.   - Echo 3/18 EF 10-15% mild AI. Mod RV dysfunction. LVAD cannula OK - has significant RV failure - Stable NYHA II-Early III Volume status looks good - Renal function worsening 2.0 -> 2.3 -> 2.7 - Remains off all diuretics including spiro - Failed Jardiance due to volume depletion - He has not tolerated ACE-I or amlodipine or  b-blocker in the past. - Evidence of RHF on recent LE Doppler study. Scr worsening. Will plan RHC to further assess hemodynamics  3) LVAD: HMII 08/2013 for DT. - VAD interrogated personally. Parameters stable. No further LOW FLOW alarmd - Admitted in May 2018 with elevated LDH with bival.  - Continue aspirin and coumadin.  - Hgb stable at 12.3 - LDH 221 - DL site improved from previous. Now off doxy - MAPs ok. Hydrlalzine recently stopped due to orthostasis.  - Continue warfarin INR 3.2. Discussed dosing with PharmD personally.  4) Atrial flutter, recurrent - patient paced out of AFL in 6/22. Atrial therapies enabled  - continue sotalol (on for VT). QTs slightly prolonged but stable - AC as above - may need to consider ablation if recurs  5) Chronic anticoagulation: INR goal 2.0-3.0.    - INR 3.2. See above - Continue ASA 81 mg for LVAD + warfarin.   6) HTN:  - MAPs stable. Hydrllazine recently stopped due to orthostasis. Improved   7) Hypothyroidism:  - Followed by Dr Dwyane Dee. Stable.No change   8) Hyperlipidemia:  - Continue Crestor. Zetia stopped due to cost.  - No change.   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 RHC next week - failed Jardiance  11) Elevated liver enzymes - normalized off amio. Liver u/s ok - labs ok  12) R knee OA - previously had long discussion about risks/benefits of R TKA in setting of VAD support - would be relatively high risk with his RV failure but if was life-limiting symptoms we would certainly support him through it (once rhythm more stable)  Total time spent 40 minutes. Over half that time spent discussing above.    Glori Bickers, MD  8:18 PM

## 2021-07-26 NOTE — Progress Notes (Signed)
VAD CLINIC NOTE   HPI:  Johnny Oneill is a 80 y.o. 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    LVAD Issues  Elevated LDH--bivalirudin. D/C LDH 188  Presented to ER on 06/27/19 with syncopal episode and head injury from fall. ECG showed VT @ 240. Underwent emergent DC-CV in ER. Head CT with no bleed. Was admitted and had ICD gen replacement (EOL). Had small hematoma at site. Amio started. Post-discharge found to have episode of AFL on ICD remote check but has since resolved.    Had ICD shock for VT on 06/09/20. Started on sotalol   On 02/06/21 Paced out of AFL.  Seen in ER 05/2021 due to LOW FLOW alarms. Found to be in VT rate 170. DCCV x 1 - returned to [place] with frequent PVCs. Pacer settings adjusted by [name] PA- DDD 90. Post cardioversion persistent LOW FLOWS noted. Received 2 L IVF. Low flows resolved, and he was discharged home.   He presents today for routine f/u. Last visit started on mexilitene for PVCs. Feels good. Remains active. Denies orthopnea or PND. No fevers, chills or problems with driveline. No bleeding, melena or neuro symptoms. No VAD alarms. Taking all meds as prescribed.    VAD Indication: Destination therapy- patient choice     VAD interrogation & Equipment Management: Speed: 9600 Flow: 3.9 Power: 5.5 w    PI: 7.1   Alarms: few low voltage advisories Events: 10 - 25 daily   Fixed speed 9600  Low speed limit: 9000   Primary Controller:  Replace back up battery in 15 months Back up controller:  Replace back up battery in 29 months -- did not bring today   Annual Equipment Maintenance on UBC/PM was performed on 10/2020.   I reviewed the LVAD parameters from today and compared the results to the patient's prior recorded data. LVAD interrogation was NEGATIVE for significant power changes, NEGATIVE for clinical alarms and STABLE for PI events/speed drops. No  programming changes were made and pump is functioning within specified parameters. Pt is performing daily controller and system monitor self tests along with completing weekly and monthly maintenance for LVAD equipment.    Past Medical History:  Diagnosis Date   Anxiety    Automatic implantable cardioverter-defibrillator in situ    CAD (coronary artery disease)    S/P stenting of LCX in 2004;  s/p Lat MI 2009 with occlusion of the LCX - treated with Promus stenting. Has total occlusion of the RCA.    CHF (congestive heart failure) (HCC)    Hypercholesteremia    Hypertension    Hypothyroidism    ICD (implantable cardiac defibrillator) in place    PROPHYLACTIC      medtronic   Inferior MI ([place]) "? date"; 2009   Ischemic cardiomyopathy    a. 10/09 Echo: Sev LV Dysfxn, inf/lat AK, Mod MR.   Liver disease    LVAD (left ventricular assist device) present ([place])    Osteoarthritis    a. s/p R TKR   Pacemaker    PVC (premature ventricular contraction)    RBBB    Shortness of breath     Current Outpatient Medications  Medication Sig Dispense Refill   acetaminophen (TYLENOL) 500 MG tablet Take 500-1,000 mg by mouth every 6 (six) hours as needed (pain).     aspirin EC 81 MG tablet Take 81 mg by mouth in  the morning.     cholecalciferol (VITAMIN D3) 25 MCG (1000 UT) tablet Take 1,000 Units by mouth in the morning.     citalopram (CELEXA) 10 MG tablet Take 1 tablet by mouth once daily 90 tablet 3   Coenzyme Q10 (COQ10) 200 MG CAPS Take 200 mg by mouth in the morning.     diphenhydrAMINE (SOMINEX) 25 MG tablet Take 25 mg by mouth at bedtime as needed for sleep.     famotidine (PEPCID) 20 MG tablet Take 20 mg by mouth in the morning.     levothyroxine (EUTHYROX) 137 MCG tablet Take 1 tablet (137 mcg total) by mouth daily before breakfast. 90 tablet 1   mexiletine (MEXITIL) 150 MG capsule Take 1 capsule (150 mg total) by mouth 2 (two) times daily. 60 capsule 6   pantoprazole (PROTONIX) 40 MG  tablet Take 1 tablet (40 mg total) by mouth daily. 90 tablet 3   Probiotic Product (PROBIOTIC PO) Take 1 capsule by mouth in the morning.     rosuvastatin (CRESTOR) 20 MG tablet Take 1 tablet (20 mg total) by mouth daily. 90 tablet 3   traZODone (DESYREL) 50 MG tablet Take 1 tablet (50 mg total) by mouth at bedtime. (Patient taking differently: Take 50 mg by mouth at bedtime as needed for sleep.) 30 tablet 5   warfarin (COUMADIN) 5 MG tablet Take 5 mg (1 tablet) every Monday/Friday and 7.5 mg (1.5 tablets) all other days OR as directed by HF Clinic (Patient taking differently: Take 5-7.5 mg by mouth See admin instructions. Take 1.5 tablets (7.5 mg) by mouth on Mondays & Fridays at night. Take 1 tablet (5 mg) by mouth on Sundays, Tuesdays, Wednesdays, Thursdays, & Saturdays.) 135 tablet 11   furosemide (LASIX) 20 MG tablet Take 1 tablet (20 mg total) by mouth daily as needed (Take 20 mg as daily as needed for wt > 153 lbs). Reported on 01/14/2016 30 tablet 6   ibuprofen (ADVIL) 200 MG tablet Take 200 mg by mouth every 6 (six) hours as needed (knee pain.).     melatonin 5 MG TABS Take 5 mg by mouth at bedtime as needed (sleep).     potassium chloride SA (KLOR-CON) 20 MEQ tablet Take 1 tablet (20 mEq total) by mouth daily as needed. Take one potassium pill when you take your lasix. 30 tablet 6   sotalol (BETAPACE) 80 MG tablet TAKE 1 TABLET (80 MG TOTAL) BY MOUTH DAILY. 90 tablet 3   traMADol (ULTRAM) 50 MG tablet Take 1-2 tablets (50-100 mg total) by mouth every 6 (six) hours as needed for moderate pain. (Patient taking differently: Take 50-100 mg by mouth every 6 (six) hours as needed for moderate pain (knee pain).) 45 tablet 5   No current facility-administered medications for this encounter.   Facility-Administered Medications Ordered in Other Encounters  Medication Dose Route Frequency Provider Last Rate Last Admin   etomidate (AMIDATE) injection    Anesthesia Intra-op Myna Bright, CRNA   20  mg at 02/08/14 0915   rocuronium [facility]) injection    Anesthesia Intra-op Myna Bright, CRNA   50 mg at 02/08/14 0932   succinylcholine (ANECTINE) injection    Anesthesia Intra-op Myna Bright, CRNA   100 mg at 02/08/14 0915   Ace inhibitors, Amiodarone, Diovan [valsartan], Lipitor [atorvastatin calcium], Jardiance [empagliflozin], and Norvasc [amlodipine]  Review of systems complete and found to be negative unless listed in HPI.      Vital Signs:  Doppler Pressure:  84 Vitals:   07/22/21 1032 07/22/21 1037  BP: (!) 126/95 122/88  Temp:     Vital Signs:   Temp: 97.5 Doppler Pressure: 130 Automatc BP: 122/88 (100) HR: 80 v-paced SPO2: UTO % on RA   Weight: 170.6 lb w/ eqt Last weight: 173.8 lb  General:  NAD.  HEENT: normal  Neck: supple. JVP 5-6.  Carotids 2+ bilat; no bruits. No lymphadenopathy or thryomegaly appreciated. Cor: LVAD hum.  Lungs: Clear. Abdomen: obese soft, nontender, non-distended. No hepatosplenomegaly. No bruits or masses. Good bowel sounds. Driveline site clean. Anchor in place.  Extremities: no cyanosis, clubbing, rash. Warm no edema  Neuro: alert & oriented x 3. No focal deficits. Moves all 4 without problem    ASSESSMENT AND PLAN:   1) VT/Frequent PVCs - now s/p ICD gen change.  - Off amio due to cerebellar side effects.  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 Now on sotalol. QTc stable on ECG - Seen in ER 06/16/21 with recurrent VT. DC-CV x 1 - Less PVCs today. Continue mexilitene 150 bid - Keep K > 4.0 Mg > 2.0 - Continue to f/u with EP  2) Chronic systolic HF: ICM, EF 81-27% s/p LVAD HM II implant 08/2013 for DT.   - Echo 3/18 EF 10-15% mild AI. Mod RV dysfunction. LVAD cannula OK - has significant RV failure - Stable NYHA II-Early III Volume status looks good - Renal function worsening 2.0 -> 2.3 -> 2.7 - Remains off all diuretics including spiro - Failed Jardiance due to volume depletion - He has not tolerated ACE-I or amlodipine or  b-blocker in the past. - Evidence of RHF on recent LE Doppler study. Scr worsening. Will plan RHC to further assess hemodynamics  3) LVAD: HMII 08/2013 for DT. - VAD interrogated personally. Parameters stable. No further LOW FLOW alarmd - Admitted in May 2018 with elevated LDH with bival.  - Continue aspirin and coumadin.  - Hgb stable at 12.3 - LDH 221 - DL site improved from previous. Now off doxy - MAPs ok. Hydrlalzine recently stopped due to orthostasis.  - Continue warfarin INR 3.2. Discussed dosing with PharmD personally.  4) Atrial flutter, recurrent - patient paced out of AFL in 6/22. Atrial therapies enabled  - continue sotalol (on for VT). QTs slightly prolonged but stable - AC as above - may need to consider ablation if recurs  5) Chronic anticoagulation: INR goal 2.0-3.0.    - INR 3.2. See above - Continue ASA 81 mg for LVAD + warfarin.   6) HTN:  - MAPs stable. Hydrllazine recently stopped due to orthostasis. Improved   7) Hypothyroidism:  - Followed by Dr Dwyane Dee. Stable.No change   8) Hyperlipidemia:  - Continue Crestor. Zetia stopped due to cost.  - No change.   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 RHC next week - failed Jardiance  11) Elevated liver enzymes - normalized off amio. Liver u/s ok - labs ok  12) R knee OA - previously had long discussion about risks/benefits of R TKA in setting of VAD support - would be relatively high risk with his RV failure but if was life-limiting symptoms we would certainly support him through it (once rhythm more stable)  Total time spent 40 minutes. Over half that time spent discussing above.    Glori Bickers, MD  8:18 PM

## 2021-07-28 ENCOUNTER — Ambulatory Visit (HOSPITAL_COMMUNITY)
Admission: RE | Admit: 2021-07-28 | Discharge: 2021-07-28 | Disposition: A | Discharge status: Home / Self Care | Payer: Medicare Other | Attending: Internal Medicine | Admitting: Internal Medicine

## 2021-07-28 ENCOUNTER — Ambulatory Visit (HOSPITAL_COMMUNITY): Payer: Self-pay | Admitting: Pharmacist

## 2021-07-28 ENCOUNTER — Encounter (HOSPITAL_COMMUNITY)
Admission: RE | Disposition: A | Discharge status: Home / Self Care | Payer: Self-pay | Source: Home / Self Care | Attending: Internal Medicine

## 2021-07-28 DIAGNOSIS — Z95811 Presence of heart assist device: Secondary | ICD-10-CM | POA: Insufficient documentation

## 2021-07-28 DIAGNOSIS — Z7901 Long term (current) use of anticoagulants: Secondary | ICD-10-CM | POA: Diagnosis not present

## 2021-07-28 DIAGNOSIS — I493 Ventricular premature depolarization: Secondary | ICD-10-CM | POA: Insufficient documentation

## 2021-07-28 DIAGNOSIS — I2510 Atherosclerotic heart disease of native coronary artery without angina pectoris: Secondary | ICD-10-CM | POA: Insufficient documentation

## 2021-07-28 DIAGNOSIS — I255 Ischemic cardiomyopathy: Secondary | ICD-10-CM | POA: Insufficient documentation

## 2021-07-28 DIAGNOSIS — M1711 Unilateral primary osteoarthritis, right knee: Secondary | ICD-10-CM | POA: Insufficient documentation

## 2021-07-28 DIAGNOSIS — I5022 Chronic systolic (congestive) heart failure: Secondary | ICD-10-CM | POA: Diagnosis not present

## 2021-07-28 DIAGNOSIS — N1832 Chronic kidney disease, stage 3b: Secondary | ICD-10-CM | POA: Diagnosis not present

## 2021-07-28 DIAGNOSIS — Z95810 Presence of automatic (implantable) cardiac defibrillator: Secondary | ICD-10-CM | POA: Insufficient documentation

## 2021-07-28 DIAGNOSIS — I4892 Unspecified atrial flutter: Secondary | ICD-10-CM | POA: Diagnosis not present

## 2021-07-28 DIAGNOSIS — Z79899 Other long term (current) drug therapy: Secondary | ICD-10-CM | POA: Diagnosis not present

## 2021-07-28 DIAGNOSIS — Z7982 Long term (current) use of aspirin: Secondary | ICD-10-CM | POA: Insufficient documentation

## 2021-07-28 DIAGNOSIS — E039 Hypothyroidism, unspecified: Secondary | ICD-10-CM | POA: Diagnosis not present

## 2021-07-28 DIAGNOSIS — R748 Abnormal levels of other serum enzymes: Secondary | ICD-10-CM | POA: Insufficient documentation

## 2021-07-28 DIAGNOSIS — I130 Hypertensive heart and chronic kidney disease with heart failure and stage 1 through stage 4 chronic kidney disease, or unspecified chronic kidney disease: Secondary | ICD-10-CM | POA: Insufficient documentation

## 2021-07-28 HISTORY — PX: RIGHT HEART CATH: CATH118263

## 2021-07-28 LAB — POCT I-STAT EG7
Acid-Base Excess: 1 mmol/L (ref 0.0–2.0)
Acid-Base Excess: 2 mmol/L (ref 0.0–2.0)
Bicarbonate: 26.1 mmol/L (ref 20.0–28.0)
Bicarbonate: 26.9 mmol/L (ref 20.0–28.0)
Calcium, Ion: 1.23 mmol/L (ref 1.15–1.40)
Calcium, Ion: 1.24 mmol/L (ref 1.15–1.40)
HCT: 31 % — ABNORMAL LOW (ref 39.0–52.0)
HCT: 31 % — ABNORMAL LOW (ref 39.0–52.0)
Hemoglobin: 10.5 g/dL — ABNORMAL LOW (ref 13.0–17.0)
Hemoglobin: 10.5 g/dL — ABNORMAL LOW (ref 13.0–17.0)
O2 Saturation: 56 %
O2 Saturation: 59 %
Potassium: 4.4 mmol/L (ref 3.5–5.1)
Potassium: 4.5 mmol/L (ref 3.5–5.1)
Sodium: 141 mmol/L (ref 135–145)
Sodium: 141 mmol/L (ref 135–145)
TCO2: 27 mmol/L (ref 22–32)
TCO2: 28 mmol/L (ref 22–32)
pCO2, Ven: 43.2 mmHg — ABNORMAL LOW (ref 44.0–60.0)
pCO2, Ven: 43.5 mmHg — ABNORMAL LOW (ref 44.0–60.0)
pH, Ven: 7.389 (ref 7.250–7.430)
pH, Ven: 7.399 (ref 7.250–7.430)
pO2, Ven: 30 mmHg — CL (ref 32.0–45.0)
pO2, Ven: 31 mmHg — CL (ref 32.0–45.0)

## 2021-07-28 LAB — PROTIME-INR
INR: 3.4 — ABNORMAL HIGH (ref 0.8–1.2)
Prothrombin Time: 34 seconds — ABNORMAL HIGH (ref 11.4–15.2)

## 2021-07-28 SURGERY — RIGHT HEART CATH
Anesthesia: LOCAL

## 2021-07-28 MED ORDER — SODIUM CHLORIDE 0.9% FLUSH: 3.0000 mL | INTRAVENOUS | Status: DC | PRN

## 2021-07-28 MED ORDER — SODIUM CHLORIDE 0.9 % IV SOLN: 250.0000 mL | INTRAVENOUS | Status: DC | PRN

## 2021-07-28 MED ORDER — LIDOCAINE HCL (PF) 1 % IJ SOLN
INTRAMUSCULAR | Status: DC | PRN
  Administered 2021-07-28: 20 mL
  Administered 2021-07-28 (×2): 2 mL

## 2021-07-28 MED ORDER — LIDOCAINE HCL (PF) 1 % IJ SOLN
INTRAMUSCULAR | Status: AC
  Filled 2021-07-28: qty 30

## 2021-07-28 MED ORDER — ONDANSETRON HCL 4 MG/2ML IJ SOLN: 4.0000 mg | Freq: Four times a day (QID) | INTRAMUSCULAR | Status: DC | PRN

## 2021-07-28 MED ORDER — HEPARIN (PORCINE) IN NACL 1000-0.9 UT/500ML-% IV SOLN
INTRAVENOUS | Status: DC | PRN
  Administered 2021-07-28: 500 mL

## 2021-07-28 MED ORDER — ACETAMINOPHEN 325 MG PO TABS: 650.0000 mg | ORAL_TABLET | ORAL | Status: DC | PRN

## 2021-07-28 MED ORDER — HYDRALAZINE HCL 20 MG/ML IJ SOLN: 10.0000 mg | INTRAMUSCULAR | Status: DC | PRN

## 2021-07-28 MED ORDER — LABETALOL HCL 5 MG/ML IV SOLN: 10.0000 mg | INTRAVENOUS | Status: DC | PRN

## 2021-07-28 MED ORDER — SODIUM CHLORIDE 0.9% FLUSH: 3.0000 mL | Freq: Two times a day (BID) | INTRAVENOUS | Status: DC

## 2021-07-28 MED ORDER — HEPARIN (PORCINE) IN NACL 1000-0.9 UT/500ML-% IV SOLN
INTRAVENOUS | Status: AC
  Filled 2021-07-28: qty 500

## 2021-07-28 MED ORDER — SODIUM CHLORIDE 0.9 % IV SOLN: INTRAVENOUS | Status: DC

## 2021-07-28 MED ORDER — ASPIRIN 81 MG PO CHEW: 81.0000 mg | CHEWABLE_TABLET | ORAL | Status: DC

## 2021-07-28 SURGICAL SUPPLY — 15 items
CATH BALLN WEDGE 5F 110CM (CATHETERS) ×1 IMPLANT
CATH SWAN GANZ 7F STRAIGHT (CATHETERS) ×1 IMPLANT
CLOSURE MYNX CONTROL 6F/7F (Vascular Products) ×1 IMPLANT
GUIDEWIRE .025 260CM (WIRE) ×1 IMPLANT
KIT MICROPUNCTURE NIT STIFF (SHEATH) ×1 IMPLANT
PACK CARDIAC CATHETERIZATION (CUSTOM PROCEDURE TRAY) ×2 IMPLANT
PROTECTION STATION PRESSURIZED (MISCELLANEOUS) ×2
SHEATH GLIDE SLENDER 4/5FR (SHEATH) ×2 IMPLANT
SHEATH PINNACLE 7F 10CM (SHEATH) ×1 IMPLANT
SHEATH PROBE COVER 6X72 (BAG) ×1 IMPLANT
STATION PROTECTION PRESSURIZED (MISCELLANEOUS) IMPLANT
TRANSDUCER W/STOPCOCK (MISCELLANEOUS) ×2 IMPLANT
TUBING ART PRESS 72  MALE/FEM (TUBING) ×2
TUBING ART PRESS 72 MALE/FEM (TUBING) IMPLANT
WIRE EMERALD 3MM-J .025X260CM (WIRE) ×1 IMPLANT

## 2021-07-28 NOTE — Progress Notes (Signed)
VAD Coordinator Procedure Note:   VAD Coordinator met patient in Cath Lab # 9. Pt undergoing RHC per Dr. Haroldine Laws. Hemodynamics and VAD parameters monitored throughout the procedure. Blood pressures were obtained with automatic cuff on left arm.    Time: Doppler Auto  BP Flow PI Power Speed  Pre-procedure:           12:30  152/118 (127) 3.2 6.7 5.0 9600           During procedure:           12:45  148/109 (122) 3.5 6.9 5.0    13:00  155/114 (128) 3.5 6.8 5.1    13:15  144/104 (116) 3.7 7.1 5.2    13:30  140/99 (114) 3.7 7.2 5.1    13:45  147/110 (122) 3.6 7.1 5.1   Recovery Area:          14:00  140/98 (112) 3.8 7.1 5.1     Patient tolerated the procedure well. VAD Coordinator accompanied patient to recovery area. Discussed anticoagulation plan with Audry Riles, PharmD.  Dr. Haroldine Laws updated patient's wife.   Patient Instructions per Dr. Haroldine Laws: Take it easy for next two days; no working, heavy lifting, pulling, or straining. Hold coumadin tonight. Eat extra greens today. Take 5 mg tomorrow.    Patient Instructions per Audry Riles, PharmD: Starting tomorrow, take 5 mg daily of Coumadin. Return for INR and BMP on 08/12/21 at 10:00 am  Patient Disposition: Home with wife.  Zada Girt RN, [place] Coordinator [phone]

## 2021-07-28 NOTE — Progress Notes (Signed)
LVAD INR 

## 2021-07-28 NOTE — Interval H&P Note (Signed)
History and Physical Interval Note:  07/28/2021 12:23 PM  Johnny Oneill  has presented today for surgery, with the diagnosis of heart failure - LVAD patient.  The various methods of treatment have been discussed with the patient and family. After consideration of risks, benefits and other options for treatment, the patient has consented to  Procedure(s): RIGHT HEART CATH (N/A) as a surgical intervention.  The patient's history has been reviewed, patient examined, no change in status, stable for surgery.  I have reviewed the patient's chart and labs.  Questions were answered to the patient's satisfaction.     Collen Hostler

## 2021-07-29 ENCOUNTER — Encounter (HOSPITAL_COMMUNITY): Payer: Self-pay | Admitting: Internal Medicine

## 2021-07-29 ENCOUNTER — Encounter: Payer: Medicare Other | Admitting: Cardiology

## 2021-07-29 NOTE — Progress Notes (Deleted)
Electrophysiology Office Note   Date:  07/29/2021   ID:  Johnny Oneill, DOB 03-Nov-1940, MRN [id number]  PCP:  Lavone Orn, MD  Cardiologist:  Yonah Primary Electrophysiologist:  Ahmyah Gidley Meredith Leeds, MD    Chief Complaint: CHF   History of Present Illness: Johnny Oneill is a 80 y.o. male who is being seen today for the evaluation of CHF at the request of Lavone Orn, MD. Presenting today for electrophysiology evaluation.  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  He was admitted to the hospital October 2020 after an episode of syncope.  He was found to be in ventricular tachycardia and had an ICD generator change 04/20/2019.  He had another episode of syncope 06/09/2020 and was found to be in ventricular tachycardia.  He was started on sotalol at that time.  He represented to the emergency room October 2022 due to low flow alarms.  He was found to be in ventricular tachycardia.  He had a cardioversion.  Low flows were noted and he received 2 L of fluid with resolution of his low flows.  He was started on mexiletine 150 mg twice daily.  Today, denies symptoms of palpitations, chest pain, shortness of breath, orthopnea, PND, lower extremity edema, claudication, dizziness, presyncope, syncope, bleeding, or neurologic sequela. The patient is tolerating medications without difficulties. ***    Past Medical History:  Diagnosis Date   Anxiety    Automatic implantable cardioverter-defibrillator in situ    CAD (coronary artery disease)    S/P stenting of LCX in 2004;  s/p Lat MI 2009 with occlusion of the LCX - treated with Promus stenting. Has total occlusion of the RCA.    CHF (congestive heart failure) (HCC)    Hypercholesteremia    Hypertension    Hypothyroidism    ICD (implantable cardiac defibrillator) in place    PROPHYLACTIC      medtronic   Inferior MI ([place]) "?  date"; 2009   Ischemic cardiomyopathy    a. 10/09 Echo: Sev LV Dysfxn, inf/lat AK, Mod MR.   Liver disease    LVAD (left ventricular assist device) present ([place])    Osteoarthritis    a. s/p R TKR   Pacemaker    PVC (premature ventricular contraction)    RBBB    Shortness of breath    Past Surgical History:  Procedure Laterality Date   CARPAL TUNNEL RELEASE Right 05/29/2016   Procedure: CARPAL TUNNEL RELEASE;  Surgeon: Earlie Server, MD;  Location: [place];  Service: Orthopedics;  Laterality: Right;   CORONARY ANGIOPLASTY WITH STENT PLACEMENT  2004   CORONARY ANGIOPLASTY WITH STENT PLACEMENT  07/2008   DEFIBRILLATOR  2009   ESOPHAGOGASTRODUODENOSCOPY N/A 09/15/2013   Procedure: ESOPHAGOGASTRODUODENOSCOPY (EGD);  Surgeon: Ladene Artist, MD;  Location[facility] ENDOSCOPY;  Service: Endoscopy;  Laterality: N/A;  bedside   HEMATOMA EVACUATION Right 11/06/2013   Procedure: EVACUATION HEMATOMA;  Surgeon: Gaye Pollack, MD;  Location: [place];  Service: Cardiothoracic;  Laterality: Right;   ICD GENERATOR CHANGEOUT N/A 06/28/2019   Procedure: ICD GENERATOR CHANGEOUT;  Surgeon: Constance Haw, MD;  Location: [place] CV LAB;  Service: Cardiovascular;  Laterality: N/A;   INSERT / REPLACE / REMOVE PACEMAKER  2009   INSERTION OF IMPLANTABLE LEFT VENTRICULAR ASSIST DEVICE N/A 09/18/2013   Procedure: INSERTION OF IMPLANTABLE LEFT VENTRICULAR ASSIST DEVICE;  Surgeon: Ivin Poot, MD;  Location: [place];  Service: Open Heart Surgery;  Laterality: N/A;  CIRC ARREST  NITRIC OXIDE   INTRA-AORTIC BALLOON PUMP INSERTION N/A 09/14/2013   Procedure: INTRA-AORTIC BALLOON PUMP INSERTION;  Surgeon: Jolaine Artist, MD;  Location: [facility] CATH LAB;  Service: Cardiovascular;  Laterality: N/A;   INTRAOPERATIVE TRANSESOPHAGEAL ECHOCARDIOGRAM N/A 09/18/2013   Procedure: INTRAOPERATIVE TRANSESOPHAGEAL ECHOCARDIOGRAM;  Surgeon: Ivin Poot, MD;  Location: [place];  Service: Open Heart Surgery;  Laterality: N/A;   KNEE  ARTHROSCOPY     bilaterally   KNEE ARTHROSCOPY W/ PARTIAL MEDIAL MENISCECTOMY  12/2002   left   LEFT VENTRICULAR ASSIST DEVICE     RIGHT HEART CATH N/A 07/28/2021   Procedure: RIGHT HEART CATH;  Surgeon: Jolaine Artist, MD;  Location: [place] CV LAB;  Service: Cardiovascular;  Laterality: N/A;   RIGHT HEART CATHETERIZATION N/A 08/25/2013   Procedure: RIGHT HEART CATH;  Surgeon: Jolaine Artist, MD;  Location: [facility] CATH LAB;  Service: Cardiovascular;  Laterality: N/A;   RIGHT HEART CATHETERIZATION N/A 09/13/2013   Procedure: RIGHT HEART CATH;  Surgeon: Jolaine Artist, MD;  Location: [facility] CATH LAB;  Service: Cardiovascular;  Laterality: N/A;   RIGHT HEART CATHETERIZATION N/A 02/08/2014   Procedure: RIGHT HEART CATH;  Surgeon: Jolaine Artist, MD;  Location: [facility] CATH LAB;  Service: Cardiovascular;  Laterality: N/A;   RIGHT HEART CATHETERIZATION N/A 02/12/2014   Procedure: RIGHT HEART CATH;  Surgeon: Jolaine Artist, MD;  Location: [facility] CATH LAB;  Service: Cardiovascular;  Laterality: N/A;   TOTAL KNEE ARTHROPLASTY  11/27/11   left   TOTAL KNEE ARTHROPLASTY  11/27/2011   Procedure: TOTAL KNEE ARTHROPLASTY;  Surgeon: Yvette Rack., MD;  Location: [place];  Service: Orthopedics;  Laterality: Left;  left total knee arthroplasty   VIDEO ASSISTED THORACOSCOPY Right 11/06/2013   Procedure: VIDEO ASSISTED THORACOSCOPY;  Surgeon: Gaye Pollack, MD;  Location: [facility] OR;  Service: Cardiothoracic;  Laterality: Right;     Current Outpatient Medications  Medication Sig Dispense Refill   acetaminophen (TYLENOL) 500 MG tablet Take 500-1,000 mg by mouth every 6 (six) hours as needed (pain).     aspirin EC 81 MG tablet Take 81 mg by mouth in the morning.     cholecalciferol (VITAMIN D3) 25 MCG (1000 UT) tablet Take 1,000 Units by mouth in the morning.     citalopram (CELEXA) 10 MG tablet Take 1 tablet by mouth once daily 90 tablet 3   Coenzyme Q10 (COQ10) 200 MG CAPS Take 200 mg by mouth in the  morning.     diphenhydrAMINE (SOMINEX) 25 MG tablet Take 25 mg by mouth at bedtime as needed for sleep.     famotidine (PEPCID) 20 MG tablet Take 20 mg by mouth in the morning.     furosemide (LASIX) 20 MG tablet Take 1 tablet (20 mg total) by mouth daily as needed (Take 20 mg as daily as needed for wt > 153 lbs). Reported on 01/14/2016 30 tablet 6   ibuprofen (ADVIL) 200 MG tablet Take 200 mg by mouth every 6 (six) hours as needed (knee pain.).     levothyroxine (EUTHYROX) 137 MCG tablet Take 1 tablet (137 mcg total) by mouth daily before breakfast. 90 tablet 1   melatonin 5 MG TABS Take 5 mg by mouth at bedtime as needed (sleep).     mexiletine (MEXITIL) 150 MG capsule Take 1 capsule (150 mg total) by mouth 2 (two) times daily. 60 capsule 6   pantoprazole (PROTONIX) 40 MG  tablet Take 1 tablet (40 mg total) by mouth daily. 90 tablet 3   potassium chloride SA (KLOR-CON) 20 MEQ tablet Take 1 tablet (20 mEq total) by mouth daily as needed. Take one potassium pill when you take your lasix. 30 tablet 6   Probiotic Product (PROBIOTIC PO) Take 1 capsule by mouth in the morning.     rosuvastatin (CRESTOR) 20 MG tablet Take 1 tablet (20 mg total) by mouth daily. 90 tablet 3   sotalol (BETAPACE) 80 MG tablet TAKE 1 TABLET (80 MG TOTAL) BY MOUTH DAILY. 90 tablet 3   traMADol (ULTRAM) 50 MG tablet Take 1-2 tablets (50-100 mg total) by mouth every 6 (six) hours as needed for moderate pain. (Patient taking differently: Take 50-100 mg by mouth every 6 (six) hours as needed for moderate pain (knee pain).) 45 tablet 5   traZODone (DESYREL) 50 MG tablet Take 1 tablet (50 mg total) by mouth at bedtime. (Patient taking differently: Take 50 mg by mouth at bedtime as needed for sleep.) 30 tablet 5   warfarin (COUMADIN) 5 MG tablet Take 5 mg (1 tablet) every Monday/Friday and 7.5 mg (1.5 tablets) all other days OR as directed by [facility] Clinic (Patient taking differently: Take 5-7.5 mg by mouth See admin instructions. Take 1.5  tablets (7.5 mg) by mouth on Mondays & Fridays at night. Take 1 tablet (5 mg) by mouth on Sundays, Tuesdays, Wednesdays, Thursdays, & Saturdays.) 135 tablet 11   No current facility-administered medications for this visit.   Facility-Administered Medications Ordered in Other Visits  Medication Dose Route Frequency Provider Last Rate Last Admin   etomidate (AMIDATE) injection    Anesthesia Intra-op Myna Bright, CRNA   20 mg at 02/08/14 0915   rocuronium [facility]) injection    Anesthesia Intra-op Myna Bright, CRNA   50 mg at 02/08/14 0932   succinylcholine (ANECTINE) injection    Anesthesia Intra-op Myna Bright, CRNA   100 mg at 02/08/14 0915    Allergies:   Ace inhibitors, Amiodarone, Diovan [valsartan], Lipitor [atorvastatin calcium], Jardiance [empagliflozin], and Norvasc [amlodipine]   Social History:  The patient  reports that he has never smoked. He has never used smokeless tobacco. He reports that he does not drink alcohol and does not use drugs.   Family History:  The patient's family history includes Coronary artery disease in his mother; Heart disease in his mother; Heart failure in his father; Hyperlipidemia in his sister; Stroke in his father.   ROS:  Please see the history of present illness.   Otherwise, review of systems is positive for none.   All other systems are reviewed and negative.   PHYSICAL EXAM: VS:  There were no vitals taken for this visit. , BMI There is no height or weight on file to calculate BMI. GEN: Well nourished, well developed, in no acute distress  HEENT: normal  Neck: no JVD, carotid bruits, or masses Cardiac: ***RRR; no murmurs, rubs, or gallops,no edema  Respiratory:  clear to auscultation bilaterally, normal work of breathing GI: soft, nontender, nondistended, + BS MS: no deformity or atrophy  Skin: warm and dry, device site well healed Neuro:  Strength and sensation are intact Psych: euthymic mood, full affect  EKG:  EKG  {ACTION; IS/IS TDV:[id number]} ordered today. Personal review of the ekg ordered *** shows ***  Personal review of the device interrogation today. Results in [place]: 12/01/2020: B Natriuretic Peptide 409.8 03/28/2021: TSH 0.98 07/22/2021: ALT 18; BUN 31;  Creatinine, Ser 2.75; Magnesium 1.9; Platelets 198 07/28/2021: Hemoglobin 10.5; Hemoglobin 10.5; Potassium 4.5; Potassium 4.4; Sodium 141; Sodium 141    Lipid Panel     Component Value Date/Time   CHOL 93 05/28/2015 0943   TRIG 112.0 05/28/2015 0943   HDL 41.50 05/28/2015 0943   CHOLHDL 2 05/28/2015 0943   VLDL 22.4 05/28/2015 0943   LDLCALC 29 05/28/2015 0943     Wt Readings from Last 3 Encounters:  07/28/21 170 lb (77.1 kg)  07/22/21 170 lb 9.6 oz (77.4 kg)  06/24/21 174 lb (78.9 kg)      Other studies Reviewed: Additional studies/ records that were reviewed today include: TTE 06/28/19  Review of the above records today demonstrates:   1. Left ventricular ejection fraction, by visual estimation, is <20%. The  left ventricle has severely decreased function. Left ventricular septal  wall thickness was normal. Normal left ventricular posterior wall  thickness. There is no left ventricular  hypertrophy.   2. LVAD: LVAD inflow cannula well-seated and without septal  contact.summary.   3. Left ventricular diastolic parameters are consistent with Grade I  diastolic dysfunction (impaired relaxation).   4. Moderately dilated left ventricular internal cavity size.   5. Global right ventricle has moderately reduced systolic function.The  right ventricular size is normal. No increase in right ventricular wall  thickness.   6. Left atrial size was mildly dilated.   7. Right atrial size was normal.   8. The mitral valve is normal in structure. Moderate mitral valve  regurgitation. No evidence of mitral stenosis.   9. The tricuspid valve is normal in structure. Tricuspid valve  regurgitation is trivial.  10. Aortic  valve regurgitation is moderate.  11. The aortic valve is tricuspid. Aortic valve regurgitation is moderate.  No evidence of aortic valve sclerosis or stenosis.  12. Moderate, continuous aortic regurgitation.  13. The pulmonic valve was normal in structure. Pulmonic valve  regurgitation is not visualized.  14. Mildly elevated pulmonary artery systolic pressure.  15. The inferior vena cava is dilated in size with >50% respiratory  variability, suggesting right atrial pressure of 8 mmHg.    ASSESSMENT AND PLAN:  1.  Ventricular tachycardia: Status post Medtronic ICD.  Device functioning appropriately.  Sotalol 80 mg daily, mexiletine 150 mg twice daily.  Unfortunately he has more frequent episodes of ventricular tachycardia and had an emergency room visit October 2022.  He was cardioverted in the emergency room.  He has had no further ventricular tachycardia.  We Dysen Edmondson continue with current management.  2.  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  Followed by heart failure cardiology.   Current medicines are reviewed at length with the patient today.   The patient does not have concerns regarding his medicines.  The following changes were made today: ***  Labs/ tests ordered today include:  No orders of the defined types were placed in this encounter.    Disposition:   FU with Libra Gatz *** months  Signed, Tacha Manni Meredith Leeds, MD  07/29/2021 3:11 PM     [place] [id number] [phone] (office) [phone] (fax)

## 2021-08-05 ENCOUNTER — Other Ambulatory Visit: Payer: Self-pay | Admitting: Endocrinology

## 2021-08-05 DIAGNOSIS — E890 Postprocedural hypothyroidism: Secondary | ICD-10-CM

## 2021-08-11 ENCOUNTER — Other Ambulatory Visit (HOSPITAL_COMMUNITY): Payer: Self-pay | Admitting: Internal Medicine

## 2021-08-11 DIAGNOSIS — Z95811 Presence of heart assist device: Secondary | ICD-10-CM

## 2021-08-11 DIAGNOSIS — K219 Gastro-esophageal reflux disease without esophagitis: Secondary | ICD-10-CM

## 2021-08-12 ENCOUNTER — Other Ambulatory Visit (HOSPITAL_COMMUNITY): Payer: Self-pay | Admitting: Unknown Physician Specialty

## 2021-08-12 ENCOUNTER — Other Ambulatory Visit (HOSPITAL_COMMUNITY): Payer: Medicare Other

## 2021-08-12 DIAGNOSIS — Z7901 Long term (current) use of anticoagulants: Secondary | ICD-10-CM

## 2021-08-12 DIAGNOSIS — Z95811 Presence of heart assist device: Secondary | ICD-10-CM

## 2021-08-13 ENCOUNTER — Other Ambulatory Visit: Payer: Self-pay

## 2021-08-13 DIAGNOSIS — E890 Postprocedural hypothyroidism: Secondary | ICD-10-CM

## 2021-08-13 MED ORDER — LEVOTHYROXINE SODIUM 137 MCG PO TABS: 137.0000 ug | ORAL_TABLET | Freq: Every day | ORAL | 2 refills | Status: DC

## 2021-08-15 ENCOUNTER — Other Ambulatory Visit: Payer: Self-pay

## 2021-08-15 ENCOUNTER — Ambulatory Visit (HOSPITAL_COMMUNITY)
Admission: RE | Admit: 2021-08-15 | Discharge: 2021-08-15 | Disposition: A | Discharge status: Home / Self Care | Payer: Medicare Other | Source: Ambulatory Visit | Attending: Internal Medicine | Admitting: Internal Medicine

## 2021-08-15 ENCOUNTER — Ambulatory Visit (HOSPITAL_COMMUNITY): Payer: Self-pay | Admitting: Pharmacist

## 2021-08-15 DIAGNOSIS — Z7901 Long term (current) use of anticoagulants: Secondary | ICD-10-CM

## 2021-08-15 DIAGNOSIS — Z5181 Encounter for therapeutic drug level monitoring: Secondary | ICD-10-CM | POA: Diagnosis not present

## 2021-08-15 DIAGNOSIS — Z95811 Presence of heart assist device: Secondary | ICD-10-CM | POA: Diagnosis not present

## 2021-08-15 LAB — PROTIME-INR
INR: 3.6 — ABNORMAL HIGH (ref 0.8–1.2)
Prothrombin Time: 35.8 seconds — ABNORMAL HIGH (ref 11.4–15.2)

## 2021-08-15 NOTE — Progress Notes (Signed)
LVAD INR 

## 2021-08-18 ENCOUNTER — Telehealth: Payer: Self-pay | Admitting: Endocrinology

## 2021-08-18 NOTE — Telephone Encounter (Signed)
Pharmacy called needing verification on the below medication. This pharmacy can no longer provide (Levothyroxine) wanting to know if ACCORD can be used? Please ADVISE  levothyroxine (SYNTHROID) 137 MCG tablet  [place], [address] Phone:  [phone]  Fax:  [phone]

## 2021-08-18 NOTE — Telephone Encounter (Signed)
Spoke with the pharmacy to give them the okay, yes it is the manufacturer

## 2021-08-21 ENCOUNTER — Other Ambulatory Visit (HOSPITAL_COMMUNITY): Payer: Self-pay | Admitting: Unknown Physician Specialty

## 2021-08-21 DIAGNOSIS — Z95811 Presence of heart assist device: Secondary | ICD-10-CM

## 2021-08-21 DIAGNOSIS — Z7901 Long term (current) use of anticoagulants: Secondary | ICD-10-CM

## 2021-08-26 ENCOUNTER — Other Ambulatory Visit: Payer: Self-pay

## 2021-08-27 ENCOUNTER — Ambulatory Visit (HOSPITAL_COMMUNITY)
Admission: RE | Admit: 2021-08-27 | Discharge: 2021-08-27 | Disposition: A | Discharge status: Home / Self Care | Payer: Medicare Other | Source: Ambulatory Visit | Attending: Cardiology | Admitting: Cardiology

## 2021-08-27 ENCOUNTER — Other Ambulatory Visit: Payer: Self-pay

## 2021-08-27 ENCOUNTER — Ambulatory Visit (HOSPITAL_COMMUNITY): Payer: Self-pay | Admitting: Pharmacist

## 2021-08-27 DIAGNOSIS — Z95811 Presence of heart assist device: Secondary | ICD-10-CM | POA: Diagnosis not present

## 2021-08-27 DIAGNOSIS — Z5181 Encounter for therapeutic drug level monitoring: Secondary | ICD-10-CM | POA: Insufficient documentation

## 2021-08-27 DIAGNOSIS — Z7901 Long term (current) use of anticoagulants: Secondary | ICD-10-CM | POA: Insufficient documentation

## 2021-08-27 LAB — PROTIME-INR
INR: 4 — ABNORMAL HIGH (ref 0.8–1.2)
Prothrombin Time: 38.6 seconds — ABNORMAL HIGH (ref 11.4–15.2)

## 2021-08-27 NOTE — Progress Notes (Signed)
LVAD INR 

## 2021-09-05 ENCOUNTER — Other Ambulatory Visit (HOSPITAL_COMMUNITY): Payer: Self-pay | Admitting: *Deleted

## 2021-09-05 DIAGNOSIS — Z95811 Presence of heart assist device: Secondary | ICD-10-CM

## 2021-09-05 DIAGNOSIS — Z7901 Long term (current) use of anticoagulants: Secondary | ICD-10-CM

## 2021-09-08 ENCOUNTER — Other Ambulatory Visit: Payer: Self-pay

## 2021-09-08 ENCOUNTER — Ambulatory Visit (HOSPITAL_COMMUNITY)
Admission: RE | Admit: 2021-09-08 | Discharge: 2021-09-08 | Disposition: A | Discharge status: Home / Self Care | Payer: Medicare Other | Source: Ambulatory Visit | Attending: Cardiology | Admitting: Cardiology

## 2021-09-08 ENCOUNTER — Ambulatory Visit (HOSPITAL_COMMUNITY): Payer: Self-pay | Admitting: Pharmacist

## 2021-09-08 DIAGNOSIS — Z95811 Presence of heart assist device: Secondary | ICD-10-CM | POA: Insufficient documentation

## 2021-09-08 DIAGNOSIS — Z7901 Long term (current) use of anticoagulants: Secondary | ICD-10-CM | POA: Diagnosis not present

## 2021-09-08 LAB — LACTATE DEHYDROGENASE: LDH: 219 U/L — ABNORMAL HIGH (ref 98–192)

## 2021-09-08 LAB — PROTIME-INR
INR: 2.2 — ABNORMAL HIGH (ref 0.8–1.2)
Prothrombin Time: 24 seconds — ABNORMAL HIGH (ref 11.4–15.2)

## 2021-09-08 NOTE — Progress Notes (Signed)
LVAD INR 

## 2021-09-22 ENCOUNTER — Other Ambulatory Visit (HOSPITAL_COMMUNITY): Payer: Self-pay | Admitting: *Deleted

## 2021-09-22 DIAGNOSIS — Z95811 Presence of heart assist device: Secondary | ICD-10-CM

## 2021-09-22 DIAGNOSIS — Z7901 Long term (current) use of anticoagulants: Secondary | ICD-10-CM

## 2021-09-23 ENCOUNTER — Encounter (HOSPITAL_COMMUNITY): Payer: Self-pay

## 2021-09-23 ENCOUNTER — Ambulatory Visit (HOSPITAL_COMMUNITY): Payer: Self-pay | Admitting: Pharmacist

## 2021-09-23 ENCOUNTER — Ambulatory Visit (HOSPITAL_COMMUNITY)
Admission: RE | Admit: 2021-09-23 | Discharge: 2021-09-23 | Disposition: A | Discharge status: Home / Self Care | Payer: Medicare Other | Source: Ambulatory Visit | Attending: Cardiology | Admitting: Cardiology

## 2021-09-23 ENCOUNTER — Other Ambulatory Visit: Payer: Self-pay

## 2021-09-23 VITALS — BP 127/96 | HR 79 | Temp 97.4°F | Ht 69.0 in | Wt 170.0 lb

## 2021-09-23 DIAGNOSIS — Z7901 Long term (current) use of anticoagulants: Secondary | ICD-10-CM

## 2021-09-23 DIAGNOSIS — Z4509 Encounter for adjustment and management of other cardiac device: Secondary | ICD-10-CM | POA: Insufficient documentation

## 2021-09-23 DIAGNOSIS — M25561 Pain in right knee: Secondary | ICD-10-CM | POA: Insufficient documentation

## 2021-09-23 DIAGNOSIS — I472 Ventricular tachycardia, unspecified: Secondary | ICD-10-CM | POA: Diagnosis not present

## 2021-09-23 DIAGNOSIS — N184 Chronic kidney disease, stage 4 (severe): Secondary | ICD-10-CM | POA: Diagnosis not present

## 2021-09-23 DIAGNOSIS — I50810 Right heart failure, unspecified: Secondary | ICD-10-CM

## 2021-09-23 DIAGNOSIS — I5022 Chronic systolic (congestive) heart failure: Secondary | ICD-10-CM | POA: Diagnosis not present

## 2021-09-23 DIAGNOSIS — I493 Ventricular premature depolarization: Secondary | ICD-10-CM | POA: Diagnosis not present

## 2021-09-23 DIAGNOSIS — Z95811 Presence of heart assist device: Secondary | ICD-10-CM | POA: Diagnosis not present

## 2021-09-23 DIAGNOSIS — I2510 Atherosclerotic heart disease of native coronary artery without angina pectoris: Secondary | ICD-10-CM

## 2021-09-23 LAB — CBC
HCT: 32.3 % — ABNORMAL LOW (ref 39.0–52.0)
Hemoglobin: 10.3 g/dL — ABNORMAL LOW (ref 13.0–17.0)
MCH: 29.9 pg (ref 26.0–34.0)
MCHC: 31.9 g/dL (ref 30.0–36.0)
MCV: 93.6 fL (ref 80.0–100.0)
Platelets: 147 10*3/uL — ABNORMAL LOW (ref 150–400)
RBC: 3.45 MIL/uL — ABNORMAL LOW (ref 4.22–5.81)
RDW: 15.6 % — ABNORMAL HIGH (ref 11.5–15.5)
WBC: 4.5 10*3/uL (ref 4.0–10.5)
nRBC: 0 % (ref 0.0–0.2)

## 2021-09-23 LAB — COMPREHENSIVE METABOLIC PANEL
ALT: 13 U/L (ref 0–44)
AST: 18 U/L (ref 15–41)
Albumin: 4.1 g/dL (ref 3.5–5.0)
Alkaline Phosphatase: 66 U/L (ref 38–126)
Anion gap: 9 (ref 5–15)
BUN: 26 mg/dL — ABNORMAL HIGH (ref 8–23)
CO2: 25 mmol/L (ref 22–32)
Calcium: 8.9 mg/dL (ref 8.9–10.3)
Chloride: 105 mmol/L (ref 98–111)
Creatinine, Ser: 2.77 mg/dL — ABNORMAL HIGH (ref 0.61–1.24)
GFR, Estimated: 23 mL/min — ABNORMAL LOW (ref >60)
Glucose, Bld: 72 mg/dL (ref 70–99)
Potassium: 3.8 mmol/L (ref 3.5–5.1)
Sodium: 139 mmol/L (ref 135–145)
Total Bilirubin: 0.7 mg/dL (ref 0.3–1.2)
Total Protein: 7.2 g/dL (ref 6.5–8.1)

## 2021-09-23 LAB — PROTIME-INR
INR: 2.9 — ABNORMAL HIGH (ref 0.8–1.2)
Prothrombin Time: 30.2 seconds — ABNORMAL HIGH (ref 11.4–15.2)

## 2021-09-23 LAB — MAGNESIUM: Magnesium: 1.8 mg/dL (ref 1.7–2.4)

## 2021-09-23 LAB — PREALBUMIN: Prealbumin: 20.1 mg/dL (ref 18–38)

## 2021-09-23 LAB — LACTATE DEHYDROGENASE: LDH: 220 U/L — ABNORMAL HIGH (ref 98–192)

## 2021-09-23 NOTE — Progress Notes (Addendum)
Patient presents for 2 mo f/u and 8 yr Intermacs and annual maintenance in [facility] today with [name]. Reports no problems with VAD equipment or concerns with drive line.   Patient reports he has been doing well overall. Denies any palpitations, dizziness, or syncope since last visit. Patient states since being started on Sotalol, he hasn't had any heart rhythm issues.   Chief complaint today is right knee pain. He is followed by Dr. French Ana (Ortho surgery) with recommendation for knee replacement. Patient says Dr. French Ana will reach out to Dr. Haroldine Laws to discuss risk vs benefit of elective knee replacement. Pt says he is at the point of needing surgery, because he feels his knee is "bone on bone" and fears he will lose ability to walk.    Vital Signs:   Temp:  97.4  Doppler Pressure: 110 Automatc BP:  127/96 (109)  HR:  79 SPO2: 100% on RA   Weight: 170 lb w/ eqt Last weight: 170.6  lb  VAD Indication: Destination therapy- patient choice     VAD interrogation & Equipment Management: Speed: 9600 Flow: 3.5 Power: 5.2w    PI: 6.3   Alarms: none Events: 0 - 5 PI events daily  Fixed speed 9600  Low speed limit: 9000   Primary Controller:  Replace back up battery in 13 months Back up controller:  Replace back up battery in 26 months   Annual Equipment Maintenance on UBC/PM was performed today 09/23/21   I reviewed the LVAD parameters from today and compared the results to the patient's prior recorded data. LVAD interrogation was NEGATIVE for significant power changes, NEGATIVE for clinical alarms and STABLE for PI events/speed drops. No programming changes were made and pump is functioning within specified parameters. Pt is performing daily controller and system monitor self tests along with completing weekly and monthly maintenance for LVAD equipment.  Exit Site Care: Drive line is being maintained weekly by [name], his wife. Existing VAD dressing C/D/I Sorbaview dressing  and Silverlon patch intact. Exit site healed and incorporated, the velour is fully implanted at exit site. Pt denies fever or chills. Pt has adequate supplies at home.    Significant Events on VAD Support:  10/2013>SBO 12/2016> elevated LDH, admit for [place]   Device: Dual Medtronic Therapies:  on 231  Pacing: AAI<=>DDD 80 bpm  Last check:  06/26/21  BP & Labs:  Doppler 110 - Doppler is reflecting MAP   Hgb 10.3 - No S/S of bleeding. Specifically denies melena/BRBPR or nosebleeds.   LDH 294 in ER 06/19/21 - established baseline of 200- 350. Denies tea-colored urine. No power elevations noted on interrogation.   8 year Intermacs follow up completed including:  Quality of Life, KCCQ-12, and Neurocognitive trail making.   Pt unable to attempt 6 minute walk due to right knee pain.   Batteries Manufacture Date: Number of uses: Re-calibration  07/07/18 276 Performed by patient   Annual maintenance completed per [employer] on patients home power module and Electrical engineer.    Back up controller: 11V backup battery charged during this visit.  Have ordered replacement batteries for patient.   [facility] Cardiomyopathy Questionnaire  KCCQ-12 09/23/2021 03/20/2021 10/16/2020  1 a. Ability to shower/bathe Slightly limited Slightly limited Not at all limited  1 b. Ability to walk 1 block Moderately limited Slightly limited Slightly limited  1 c. Ability to hurry/jog Quite a bit limited Other, Did not do Moderately limited  2. Edema feet/ankles/legs Less than once a  week Never over the past 2 weeks Never over the past 2 weeks  3. Limited by fatigue 1-2 times a week Less than once a week Never over the past 2 weeks  4. Limited by dyspnea 1-2 times a week Less than once a week At least once a day  5. Sitting up / on 3+ pillows Never over the past 2 weeks Never over the past 2 weeks Never over the past 2 weeks  6. Limited enjoyment of life Not limited at all Not limited at all Not limited  at all  7. Rest of life w/ symptoms Completely satisfied Completely satisfied Completely satisfied  8 a. Participation in hobbies Slightly limited Slightly limited Slightly limited  8 b. Participation in chores Slightly limited Did not limit at all Did not limit at all  8 c. 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 He reports he "cannot sit still" and needs to keep busy to feel alive. Fears if he sits down, he will "die". Would like to get knee replacement if at all possible in order to continue active lifestyle. He still enjoys attending gun shows and wants to continue doing so if possible.   Patient Instructions:  No change in medications. Audry Riles (PharmD) will call you with INR results and warfarin dosing. Return to [facility] in two months.    Zada Girt RN [place] Coordinator  Office: [phone]  24/7 Pager: [phone]

## 2021-09-23 NOTE — Patient Instructions (Signed)
No change in medications. Audry Riles (PharmD) will call you with INR results and warfarin dosing. Return to [facility] in two months.

## 2021-09-23 NOTE — Progress Notes (Signed)
LVAD INR 

## 2021-09-25 ENCOUNTER — Ambulatory Visit (INDEPENDENT_AMBULATORY_CARE_PROVIDER_SITE_OTHER): Payer: Medicare Other

## 2021-09-25 DIAGNOSIS — I255 Ischemic cardiomyopathy: Secondary | ICD-10-CM | POA: Diagnosis not present

## 2021-09-25 LAB — CUP PACEART REMOTE DEVICE CHECK
Battery Remaining Longevity: 73 mo
Battery Voltage: 2.97 V
Brady Statistic AP VP Percent: 79.05 %
Brady Statistic AP VS Percent: 7.44 %
Brady Statistic AS VP Percent: 13.16 %
Brady Statistic AS VS Percent: 0.34 %
Brady Statistic RA Percent Paced: 86.23 %
Brady Statistic RV Percent Paced: 91.33 %
Date Time Interrogation Session: 20230126043724
HighPow Impedance: 36 Ohm
HighPow Impedance: 42 Ohm
Implantable Lead Implant Date: 20091103
Implantable Lead Implant Date: 20091103
Implantable Lead Location: 753859
Implantable Lead Location: 753860
Implantable Lead Model: 5076
Implantable Lead Model: 6947
Implantable Pulse Generator Implant Date: 20201028
Lead Channel Impedance Value: 304 Ohm
Lead Channel Impedance Value: 361 Ohm
Lead Channel Impedance Value: 399 Ohm
Lead Channel Pacing Threshold Amplitude: 0.5 V
Lead Channel Pacing Threshold Amplitude: 1.25 V
Lead Channel Pacing Threshold Pulse Width: 0.4 ms
Lead Channel Pacing Threshold Pulse Width: 0.4 ms
Lead Channel Sensing Intrinsic Amplitude: 1.375 mV
Lead Channel Sensing Intrinsic Amplitude: 1.375 mV
Lead Channel Sensing Intrinsic Amplitude: 8 mV
Lead Channel Sensing Intrinsic Amplitude: 8 mV
Lead Channel Setting Pacing Amplitude: 1.5 V
Lead Channel Setting Pacing Amplitude: 2.75 V
Lead Channel Setting Pacing Pulse Width: 0.6 ms
Lead Channel Setting Sensing Sensitivity: 0.3 mV

## 2021-09-28 NOTE — Progress Notes (Signed)
VAD CLINIC NOTE   HPI:  Johnny Oneill is a 81 y.o. 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   Presented to ER on 06/27/19 with syncopal episode and head injury from fall. ECG showed VT @ 240. Underwent emergent DC-CV in ER. Head CT no bleed. Admitted and had ICD gen replacement (EOL).  Did not tolerate amio due to tremors.  Had ICD shock for VT on 06/09/20. Started on sotalol  On 02/06/21 Paced out of AFL.  Seen in ER 05/2021 due to LOW FLOW alarms. Found to be in VT rate 170. DCCV x 1 - returned to [place] with frequent PVCs. Pacer settings adjusted by [name] PA- DDD 90. Post cardioversion persistent LOW FLOWS noted. Received 2 L IVF. Low flows resolved, and he was discharged home.   [place] 11/22 RA = 5 RV = 33/5 PA = 33/13 (20) PCW = 10 Fick cardiac output/index = 4.0/2.1 Thermo CO/CI = 4.9/2.6 PVR = 2.0 WU Ao sat = 95% PA sat = 54%, 59% PAPI = 4.0  He presents today for routine f/u. Remains on low-dose sotalol for VT and mexilitene for PVCs. Feels good. No recent VT. Edema has been well controlled. Main complaint is ongoing severe R knee pain. Has been seeing Dr. French Ana (Ortho) for possible TKA. Denies orthopnea or PND. No fevers, chills or problems with driveline. No bleeding, melena or neuro symptoms. No VAD alarms. Taking all meds as prescribed.      VAD Indication: Destination therapy- patient choice     VAD interrogation & Equipment Management: Speed: 9600 Flow: 3.5 Power: 5.2w    PI: 6.3   Alarms: none Events: 0 - 5 PI events daily  Fixed speed 9600  Low speed limit: 9000   Primary Controller:  Replace back up battery in 13 months Back up controller:  Replace back up battery in 26 months   Annual Equipment Maintenance on UBC/PM was performed today 09/23/21   I reviewed the LVAD parameters from today and compared the results to the patient's prior recorded data. LVAD  interrogation was NEGATIVE for significant power changes, NEGATIVE for clinical alarms and STABLE for PI events/speed drops. No programming changes were made and pump is functioning within specified parameters. Pt is performing daily controller and system monitor self tests along with completing weekly and monthly maintenance for LVAD equipment.    Past Medical History:  Diagnosis Date   Anxiety    Automatic implantable cardioverter-defibrillator in situ    CAD (coronary artery disease)    S/P stenting of LCX in 2004;  s/p Lat MI 2009 with occlusion of the LCX - treated with Promus stenting. Has total occlusion of the RCA.    CHF (congestive heart failure) (HCC)    Hypercholesteremia    Hypertension    Hypothyroidism    ICD (implantable cardiac defibrillator) in place    PROPHYLACTIC      medtronic   Inferior MI ([place]) "? date"; 2009   Ischemic cardiomyopathy    a. 10/09 Echo: Sev LV Dysfxn, inf/lat AK, Mod MR.   Liver disease    LVAD (left ventricular assist device) present ([place])    Osteoarthritis    a. s/p R TKR   Pacemaker    PVC (premature ventricular contraction)    RBBB    Shortness of breath     Current Outpatient Medications  Medication Sig Dispense Refill  acetaminophen (TYLENOL) 500 MG tablet Take 500-1,000 mg by mouth every 6 (six) hours as needed (pain).     aspirin EC 81 MG tablet Take 81 mg by mouth in the morning.     cholecalciferol (VITAMIN D3) 25 MCG (1000 UT) tablet Take 1,000 Units by mouth in the morning.     citalopram (CELEXA) 10 MG tablet Take 1 tablet by mouth once daily 90 tablet 3   Coenzyme Q10 (COQ10) 200 MG CAPS Take 200 mg by mouth in the morning.     diphenhydrAMINE (SOMINEX) 25 MG tablet Take 25 mg by mouth at bedtime as needed for sleep.     famotidine (PEPCID) 20 MG tablet Take 20 mg by mouth in the morning.     ibuprofen (ADVIL) 200 MG tablet Take 200 mg by mouth every 6 (six) hours as needed (knee pain.).     levothyroxine (SYNTHROID) 137 MCG  tablet Take 1 tablet (137 mcg total) by mouth daily before breakfast. 90 tablet 2   melatonin 5 MG TABS Take 5 mg by mouth at bedtime as needed (sleep).     mexiletine (MEXITIL) 150 MG capsule Take 1 capsule (150 mg total) by mouth 2 (two) times daily. 60 capsule 6   pantoprazole (PROTONIX) 40 MG tablet Take 1 tablet by mouth once daily 90 tablet 3   Probiotic Product (PROBIOTIC PO) Take 1 capsule by mouth in the morning.     rosuvastatin (CRESTOR) 20 MG tablet Take 1 tablet (20 mg total) by mouth daily. 90 tablet 3   sotalol (BETAPACE) 80 MG tablet TAKE 1 TABLET (80 MG TOTAL) BY MOUTH DAILY. 90 tablet 3   traMADol (ULTRAM) 50 MG tablet Take 1-2 tablets (50-100 mg total) by mouth every 6 (six) hours as needed for moderate pain. (Patient taking differently: Take 50-100 mg by mouth every 6 (six) hours as needed for moderate pain (knee pain).) 45 tablet 5   traZODone (DESYREL) 50 MG tablet Take 1 tablet (50 mg total) by mouth at bedtime. (Patient taking differently: Take 50 mg by mouth at bedtime as needed for sleep.) 30 tablet 5   warfarin (COUMADIN) 5 MG tablet Take 5 mg (1 tablet) every Monday/Friday and 7.5 mg (1.5 tablets) all other days OR as directed by HF Clinic (Patient taking differently: Take 5-7.5 mg by mouth See admin instructions. Take 1.5 tablets (7.5 mg) by mouth on Mondays & Fridays at night. Take 1 tablet (5 mg) by mouth on Sundays, Tuesdays, Wednesdays, Thursdays, & Saturdays.) 135 tablet 11   furosemide (LASIX) 20 MG tablet Take 1 tablet (20 mg total) by mouth daily as needed (Take 20 mg as daily as needed for wt > 153 lbs). Reported on 01/14/2016 (Patient not taking: Reported on 09/23/2021) 30 tablet 6   potassium chloride SA (KLOR-CON) 20 MEQ tablet Take 1 tablet (20 mEq total) by mouth daily as needed. Take one potassium pill when you take your lasix. (Patient not taking: Reported on 09/23/2021) 30 tablet 6   No current facility-administered medications for this encounter.    Facility-Administered Medications Ordered in Other Encounters  Medication Dose Route Frequency Provider Last Rate Last Admin   etomidate (AMIDATE) injection    Anesthesia Intra-op Myna Bright, CRNA   20 mg at 02/08/14 0915   rocuronium [facility]) injection    Anesthesia Intra-op Myna Bright, CRNA   50 mg at 02/08/14 0932   succinylcholine (ANECTINE) injection    Anesthesia Intra-op Myna Bright, CRNA   100 mg  at 02/08/14 0915   Ace inhibitors, Amiodarone, Diovan [valsartan], Lipitor [atorvastatin calcium], Jardiance [empagliflozin], and Norvasc [amlodipine]  Review of systems complete and found to be negative unless listed in HPI.      Vital Signs:  Doppler Pressure: 84 Vitals:   09/23/21 1401 09/23/21 1402  BP: (!) 110/0 (!) 127/96  Pulse:  79  Temp:  (!) 97.4 F (36.3 C)  SpO2:  100%   Vital Signs:   Temp:  97.4  Doppler Pressure: 110 Automatc BP:  127/96 (109)    HR:  79 SPO2: 100% on RA   Weight: 170 lb w/ eqt Last weight: 170.6  lb  Physical exam: General:  NAD.  HEENT: normal  Neck: supple. JVP not elevated.  Carotids 2+ bilat; no bruits. No lymphadenopathy or thryomegaly appreciated. Cor: LVAD hum.  Lungs: Clear. Abdomen: soft, nontender, non-distended. No hepatosplenomegaly. No bruits or masses. Good bowel sounds. Driveline site clean. Anchor in place.  Extremities: no cyanosis, clubbing, rash. Warm no edema  Neuro: alert & oriented x 3. No focal deficits. Moves all 4 without problem    ASSESSMENT AND PLAN:   1) Chronic systolic HF: ICM, EF 56-38% s/p LVAD HM II implant 08/2013 for DT.   - Echo 3/18 EF 10-15% mild AI. Mod RV dysfunction. LVAD cannula OK - has struggled with RV failure however RHC 11/22 looked good (despite evidence of RVF on LE dopplers showing reflux - Stable NYHA II-Early III mostly limited by knee pain - Volume status ok.  - Remains off all diuretics including spiro - Failed Jardiance due to volume depletion - He has  not tolerated ACE-I or amlodipine or b-blocker in the past.  2) LVAD: HMII 08/2013 for DT. - VAD interrogated personally. Parameters stable. - Admitted in May 2018 with elevated LDH with bival.  - Continue aspirin and coumadin.  - Hgb stable at 12.3 -> 10.3 will follow - LDH 220 - DL site looks good - MAPs OK Hydrlalzine recently stopped due to orthostasis.  - Continue warfarin INR 2.9 Goal 2.0-3.0. Discussed dosing with PharmD personally  3) VT/Frequent PVCs - now s/p ICD gen change.  - Off amio due to cerebellar side effects.  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 Now on sotalol. QTc stable on ECG - Seen in ER 06/16/21 with recurrent VT. DC-CV x 1 - PVCs improved on mexilitene. Continue mexilitene 150 bid - Keep K > 4.0 Mg > 2.0 - Continue to f/u with EP  4) Atrial flutter, recurrent - patient paced out of AFL in 6/22. Atrial therapies enabled  - continue sotalol (on for VT). QTs slightly prolonged but stable - AC as above - may need to consider ablation if recurs  5) Chronic anticoagulation: INR goal 2.0-3.0.    - INR 2.9. See above - Continue ASA 81 mg for LVAD + warfarin.   6) HTN:  - MAPs stable. Hydrllazine recently stopped due to orthostasis. Improved   7) Hypothyroidism:  - Followed by Dr Dwyane Dee. Stable.No change   8) Hyperlipidemia:  - Continue Crestor. Zetia stopped due to cost.  - No change.   9) CAD - no s/s ischemia - continue Crestor  10) CKD IV - Creatinine baseline 1.7-2.0 - creatinine increasing, SCr  stable at 2.7 today - failed Jardiance - Needs Nephrology referral  11) Elevated liver enzymes - normalized off amio. Liver u/s ok - labs ok  12) R knee OA - Again discussed risks/benefits of R TKA in setting of VAD support - Relatively high  risk given age and RV failure but if was life-limiting symptoms we would certainly support him through it as risk is NOT prohibitive  Total time spent 35 minutes. Over half that time spent discussing above.    Glori Bickers, MD  2:54 PM

## 2021-10-06 ENCOUNTER — Other Ambulatory Visit: Payer: Self-pay

## 2021-10-06 ENCOUNTER — Other Ambulatory Visit (INDEPENDENT_AMBULATORY_CARE_PROVIDER_SITE_OTHER): Payer: Medicare Other

## 2021-10-06 DIAGNOSIS — E890 Postprocedural hypothyroidism: Secondary | ICD-10-CM | POA: Diagnosis not present

## 2021-10-06 LAB — T4, FREE: Free T4: 0.93 ng/dL (ref 0.60–1.60)

## 2021-10-06 LAB — TSH: TSH: 20.25 u[IU]/mL — ABNORMAL HIGH (ref 0.35–5.50)

## 2021-10-06 NOTE — Progress Notes (Signed)
Remote ICD transmission.   

## 2021-10-08 ENCOUNTER — Ambulatory Visit (INDEPENDENT_AMBULATORY_CARE_PROVIDER_SITE_OTHER): Payer: Medicare Other | Admitting: Endocrinology

## 2021-10-08 ENCOUNTER — Encounter: Payer: Self-pay | Admitting: Endocrinology

## 2021-10-08 ENCOUNTER — Other Ambulatory Visit: Payer: Self-pay

## 2021-10-08 VITALS — BP 110/80 | HR 80 | Ht 69.0 in | Wt 168.2 lb

## 2021-10-08 DIAGNOSIS — I255 Ischemic cardiomyopathy: Secondary | ICD-10-CM | POA: Diagnosis not present

## 2021-10-08 DIAGNOSIS — E890 Postprocedural hypothyroidism: Secondary | ICD-10-CM

## 2021-10-08 MED ORDER — SYNTHROID 175 MCG PO TABS: ORAL_TABLET | ORAL | 3 refills | Status: DC

## 2021-10-08 NOTE — Progress Notes (Signed)
Patient ID: Johnny Oneill, male   DOB: Jul 18, 1941, 81 y.o.   MRN: [id number]   Reason for Appointment:  Hypothyroidism, followup visit     History of Present Illness:   The hypothyroidism was first diagnosed after radioactive iodine treatment for [name] several years ago  Previously he had lost significant amount of weight with his worsening heart failure and subsequently this stabilized More recently his weight has improved somewhat and is stable.  Previously was taking 200 mcg daily after 3/15 when he was hospitalized   Subsequently the dose was adjusted periodically    Current regimen: he has been taking 137 mcg daily, previously was taking 6-1/2 tablets a week Taking EUTHYROX brand previously but not taking generic levothyroxine since December 2022 before  His dose was continued unchanged in  8/22 His weight is unchanged recently He does not think he has any fatigue, lethargy or cold intolerance lately   He is very consistent with taking the tablet in the morning daily 30 minutes before breakfast   Does not miss any doses Does not take any iron or calcium-containing vitamins at the same time  His TSH is unusually high at 20.2 compared to 1.0  Free T4 relatively lower   Wt Readings from Last 3 Encounters:  10/08/21 168 lb 3.2 oz (76.3 kg)  09/23/21 170 lb (77.1 kg)  07/28/21 170 lb (77.1 kg)    Lab Results  Component Value Date   TSH 20.25 (H) 10/06/2021   TSH 0.98 03/28/2021   TSH 5.93 (H) 12/24/2020   FREET4 0.93 10/06/2021   FREET4 1.14 03/28/2021   FREET4 1.02 12/24/2020      Allergies as of 10/08/2021       Reactions   Ace Inhibitors    Hypotension and elevated creatinine   Amiodarone Other (See Comments)   Cerebellar side effects   Diovan [valsartan] Other (See Comments)   Hypotension at low dose, hyperkalemia   Lipitor [atorvastatin Calcium] Other (See Comments)   Muscle pain   Jardiance [empagliflozin] Other (See  Comments)   Unsure of exact side effect   Norvasc [amlodipine] Other (See Comments)   Put patient to sleep        Medication List        Accurate as of October 08, 2021 11:18 AM. If you have any questions, ask your nurse or doctor.          acetaminophen 500 MG tablet Commonly known as: TYLENOL Take 500-1,000 mg by mouth every 6 (six) hours as needed (pain).   aspirin EC 81 MG tablet Take 81 mg by mouth in the morning.   cholecalciferol 25 MCG (1000 UNIT) tablet Commonly known as: VITAMIN D3 Take 1,000 Units by mouth in the morning.   citalopram 10 MG tablet Commonly known as: CELEXA Take 1 tablet by mouth once daily   CoQ10 200 MG Caps Take 200 mg by mouth in the morning.   diphenhydrAMINE 25 MG tablet Commonly known as: SOMINEX Take 25 mg by mouth at bedtime as needed for sleep.   famotidine 20 MG tablet Commonly known as: PEPCID Take 20 mg by mouth in the morning.   furosemide 20 MG tablet Commonly known as: LASIX Take 1 tablet (20 mg total) by mouth daily as needed (Take 20 mg as daily as needed for wt > 153 lbs). Reported on 01/14/2016   ibuprofen 200 MG tablet Commonly known as: ADVIL Take 200 mg by mouth every 6 (six) hours as  needed (knee pain.).   levothyroxine 137 MCG tablet Commonly known as: SYNTHROID Take 1 tablet (137 mcg total) by mouth daily before breakfast.   melatonin 5 MG Tabs Take 5 mg by mouth at bedtime as needed (sleep).   mexiletine 150 MG capsule Commonly known as: MEXITIL Take 1 capsule (150 mg total) by mouth 2 (two) times daily.   pantoprazole 40 MG tablet Commonly known as: PROTONIX Take 1 tablet by mouth once daily   potassium chloride SA 20 MEQ tablet Commonly known as: KLOR-CON M Take 1 tablet (20 mEq total) by mouth daily as needed. Take one potassium pill when you take your lasix.   PROBIOTIC PO Take 1 capsule by mouth in the morning.   rosuvastatin 20 MG tablet Commonly known as: CRESTOR Take 1 tablet (20  mg total) by mouth daily.   sotalol 80 MG tablet Commonly known as: BETAPACE TAKE 1 TABLET (80 MG TOTAL) BY MOUTH DAILY.   traMADol 50 MG tablet Commonly known as: ULTRAM Take 1-2 tablets (50-100 mg total) by mouth every 6 (six) hours as needed for moderate pain. What changed: reasons to take this   traZODone 50 MG tablet Commonly known as: DESYREL Take 1 tablet (50 mg total) by mouth at bedtime. What changed:  when to take this reasons to take this   warfarin 5 MG tablet Commonly known as: COUMADIN Take as directed by the anticoagulation clinic. If you are unsure how to take this medication, talk to your nurse or doctor. Original instructions: Take 5 mg (1 tablet) every Monday/Friday and 7.5 mg (1.5 tablets) all other days OR as directed by HF Clinic What changed:  how much to take how to take this when to take this additional instructions         Past Medical History:  Diagnosis Date   Anxiety    Automatic implantable cardioverter-defibrillator in situ    CAD (coronary artery disease)    S/P stenting of LCX in 2004;  s/p Lat MI 2009 with occlusion of the LCX - treated with Promus stenting. Has total occlusion of the RCA.    CHF (congestive heart failure) (HCC)    Hypercholesteremia    Hypertension    Hypothyroidism    ICD (implantable cardiac defibrillator) in place    PROPHYLACTIC      medtronic   Inferior MI ([place]) "? date"; 2009   Ischemic cardiomyopathy    a. 10/09 Echo: Sev LV Dysfxn, inf/lat AK, Mod MR.   Liver disease    LVAD (left ventricular assist device) present ([place])    Osteoarthritis    a. s/p R TKR   Pacemaker    PVC (premature ventricular contraction)    RBBB    Shortness of breath     Past Surgical History:  Procedure Laterality Date   CARPAL TUNNEL RELEASE Right 05/29/2016   Procedure: CARPAL TUNNEL RELEASE;  Surgeon: Earlie Server, MD;  Location: [place];  Service: Orthopedics;  Laterality: Right;   CORONARY ANGIOPLASTY WITH STENT  PLACEMENT  2004   CORONARY ANGIOPLASTY WITH STENT PLACEMENT  07/2008   DEFIBRILLATOR  2009   ESOPHAGOGASTRODUODENOSCOPY N/A 09/15/2013   Procedure: ESOPHAGOGASTRODUODENOSCOPY (EGD);  Surgeon: Ladene Artist, MD;  Location[facility] ENDOSCOPY;  Service: Endoscopy;  Laterality: N/A;  bedside   HEMATOMA EVACUATION Right 11/06/2013   Procedure: EVACUATION HEMATOMA;  Surgeon: Gaye Pollack, MD;  Location: [place];  Service: Cardiothoracic;  Laterality: Right;   ICD GENERATOR CHANGEOUT N/A 06/28/2019   Procedure: ICD GENERATOR  CHANGEOUT;  Surgeon: Constance Haw, MD;  Location: [place] CV LAB;  Service: Cardiovascular;  Laterality: N/A;   INSERT / REPLACE / REMOVE PACEMAKER  2009   INSERTION OF IMPLANTABLE LEFT VENTRICULAR ASSIST DEVICE N/A 09/18/2013   Procedure: INSERTION OF IMPLANTABLE LEFT VENTRICULAR ASSIST DEVICE;  Surgeon: Ivin Poot, MD;  Location: [place];  Service: Open Heart Surgery;  Laterality: N/A;  CIRC ARREST  NITRIC OXIDE   INTRA-AORTIC BALLOON PUMP INSERTION N/A 09/14/2013   Procedure: INTRA-AORTIC BALLOON PUMP INSERTION;  Surgeon: Jolaine Artist, MD;  Location: [facility] CATH LAB;  Service: Cardiovascular;  Laterality: N/A;   INTRAOPERATIVE TRANSESOPHAGEAL ECHOCARDIOGRAM N/A 09/18/2013   Procedure: INTRAOPERATIVE TRANSESOPHAGEAL ECHOCARDIOGRAM;  Surgeon: Ivin Poot, MD;  Location: [place];  Service: Open Heart Surgery;  Laterality: N/A;   KNEE ARTHROSCOPY     bilaterally   KNEE ARTHROSCOPY W/ PARTIAL MEDIAL MENISCECTOMY  12/2002   left   LEFT VENTRICULAR ASSIST DEVICE     RIGHT HEART CATH N/A 07/28/2021   Procedure: RIGHT HEART CATH;  Surgeon: Jolaine Artist, MD;  Location: [place] CV LAB;  Service: Cardiovascular;  Laterality: N/A;   RIGHT HEART CATHETERIZATION N/A 08/25/2013   Procedure: RIGHT HEART CATH;  Surgeon: Jolaine Artist, MD;  Location: [facility] CATH LAB;  Service: Cardiovascular;  Laterality: N/A;   RIGHT HEART CATHETERIZATION N/A 09/13/2013   Procedure:  RIGHT HEART CATH;  Surgeon: Jolaine Artist, MD;  Location: [facility] CATH LAB;  Service: Cardiovascular;  Laterality: N/A;   RIGHT HEART CATHETERIZATION N/A 02/08/2014   Procedure: RIGHT HEART CATH;  Surgeon: Jolaine Artist, MD;  Location: [facility] CATH LAB;  Service: Cardiovascular;  Laterality: N/A;   RIGHT HEART CATHETERIZATION N/A 02/12/2014   Procedure: RIGHT HEART CATH;  Surgeon: Jolaine Artist, MD;  Location[facility] CATH LAB;  Service: Cardiovascular;  Laterality: N/A;   TOTAL KNEE ARTHROPLASTY  11/27/11   left   TOTAL KNEE ARTHROPLASTY  11/27/2011   Procedure: TOTAL KNEE ARTHROPLASTY;  Surgeon: Yvette Rack., MD;  Location: [place];  Service: Orthopedics;  Laterality: Left;  left total knee arthroplasty   VIDEO ASSISTED THORACOSCOPY Right 11/06/2013   Procedure: VIDEO ASSISTED THORACOSCOPY;  Surgeon: Gaye Pollack, MD;  Location: [facility] OR;  Service: Cardiothoracic;  Laterality: Right;    Family History  Problem Relation Age of Onset   Heart failure Father    Stroke Father    Coronary artery disease Mother    Heart disease Mother    Hyperlipidemia Sister     Social History:  reports that he has never smoked. He has never used smokeless tobacco. He reports that he does not drink alcohol and does not use drugs.  Allergies:  Allergies  Allergen Reactions   Ace Inhibitors     Hypotension and elevated creatinine   Amiodarone Other (See Comments)    Cerebellar side effects   Diovan [Valsartan] Other (See Comments)    Hypotension at low dose, hyperkalemia   Lipitor [Atorvastatin Calcium] Other (See Comments)    Muscle pain   Jardiance [Empagliflozin] Other (See Comments)    Unsure of exact side effect   Norvasc [Amlodipine] Other (See Comments)    Put patient to sleep   ROS:  Cardiomyopathy: He has been treated with a ventricle assist device and he is on Coumadin     Examination:   BP 110/80 (BP Location: Left Arm, Patient Position: Sitting, Cuff Size: Normal)    Pulse 80    Ht 5'  9" (1.753 m)    Wt 168 lb 3.2 oz (76.3 kg)    SpO2 99%    BMI 24.84 kg/m   Biceps reflexes difficult to elicit  Skin appears normal    Assessment/Plan:   Post ablative Hypothyroidism:   He is taking levothyroxine instead of Euthyrox brand since [facility] did not have this anymore Now using 137 mcg 1 tablet daily  He does not feel any different or have any complaints of fatigue despite TSH being 20 He has not missed any doses of his levothyroxine  Since he will likely need an additional 25 mcg will go up to the 175 mcg prescription and have him take 6-1/2 tablets a week Follow-up in 2 months  There are no Patient Instructions on file for this visit.    Elayne Snare 10/08/2021, 11:18 AM

## 2021-10-10 ENCOUNTER — Other Ambulatory Visit (HOSPITAL_COMMUNITY): Payer: Self-pay | Admitting: *Deleted

## 2021-10-10 ENCOUNTER — Other Ambulatory Visit (HOSPITAL_COMMUNITY): Payer: Self-pay | Admitting: Unknown Physician Specialty

## 2021-10-10 DIAGNOSIS — Z95811 Presence of heart assist device: Secondary | ICD-10-CM

## 2021-10-10 DIAGNOSIS — Z7901 Long term (current) use of anticoagulants: Secondary | ICD-10-CM

## 2021-10-10 NOTE — Progress Notes (Signed)
Received call from patient's wife requesting refill of patient's Tramadol for his knee pain. Per wife, pt is having a hard time walking due to bone on bone knee pain. Discussed with Dr Haroldine Laws. Order received for refill. Refill for Tramadol called in per patient request to patient's pharmacy. Made pt aware that refill has been called in, and advised he make an appointment with Dr French Ana to discuss knee replacement options per Dr Haroldine Laws. He verbalized understanding.   Emerson Monte RN [place] Coordinator  Office: [phone]  24/7 Pager: [phone]

## 2021-10-14 ENCOUNTER — Ambulatory Visit (HOSPITAL_COMMUNITY)
Admission: RE | Admit: 2021-10-14 | Discharge: 2021-10-14 | Disposition: A | Discharge status: Home / Self Care | Payer: Medicare Other | Source: Ambulatory Visit | Attending: Cardiology | Admitting: Cardiology

## 2021-10-14 ENCOUNTER — Ambulatory Visit (HOSPITAL_COMMUNITY): Payer: Self-pay | Admitting: Pharmacist

## 2021-10-14 ENCOUNTER — Other Ambulatory Visit: Payer: Self-pay

## 2021-10-14 DIAGNOSIS — Z95811 Presence of heart assist device: Secondary | ICD-10-CM | POA: Insufficient documentation

## 2021-10-14 DIAGNOSIS — Z5181 Encounter for therapeutic drug level monitoring: Secondary | ICD-10-CM | POA: Insufficient documentation

## 2021-10-14 DIAGNOSIS — Z7901 Long term (current) use of anticoagulants: Secondary | ICD-10-CM | POA: Diagnosis not present

## 2021-10-14 DIAGNOSIS — M25561 Pain in right knee: Secondary | ICD-10-CM | POA: Diagnosis not present

## 2021-10-14 LAB — PROTIME-INR
INR: 2.1 — ABNORMAL HIGH (ref 0.8–1.2)
Prothrombin Time: 23.8 seconds — ABNORMAL HIGH (ref 11.4–15.2)

## 2021-10-14 NOTE — Progress Notes (Signed)
LVAD INR 

## 2021-10-31 ENCOUNTER — Other Ambulatory Visit (HOSPITAL_COMMUNITY): Payer: Self-pay | Admitting: *Deleted

## 2021-10-31 DIAGNOSIS — Z95811 Presence of heart assist device: Secondary | ICD-10-CM

## 2021-10-31 DIAGNOSIS — Z7901 Long term (current) use of anticoagulants: Secondary | ICD-10-CM

## 2021-10-31 DIAGNOSIS — I5022 Chronic systolic (congestive) heart failure: Secondary | ICD-10-CM

## 2021-11-04 ENCOUNTER — Ambulatory Visit (HOSPITAL_COMMUNITY)
Admission: RE | Admit: 2021-11-04 | Discharge: 2021-11-04 | Disposition: A | Discharge status: Home / Self Care | Payer: Medicare Other | Source: Ambulatory Visit | Attending: Cardiology | Admitting: Cardiology

## 2021-11-04 ENCOUNTER — Ambulatory Visit (HOSPITAL_COMMUNITY): Payer: Self-pay | Admitting: Pharmacist

## 2021-11-04 ENCOUNTER — Other Ambulatory Visit: Payer: Self-pay

## 2021-11-04 DIAGNOSIS — Z7901 Long term (current) use of anticoagulants: Secondary | ICD-10-CM

## 2021-11-04 DIAGNOSIS — Z95811 Presence of heart assist device: Secondary | ICD-10-CM

## 2021-11-04 DIAGNOSIS — I5022 Chronic systolic (congestive) heart failure: Secondary | ICD-10-CM | POA: Diagnosis not present

## 2021-11-04 LAB — PROTIME-INR
INR: 2.5 — ABNORMAL HIGH (ref 0.8–1.2)
Prothrombin Time: 26.9 seconds — ABNORMAL HIGH (ref 11.4–15.2)

## 2021-11-04 LAB — LACTATE DEHYDROGENASE: LDH: 192 U/L (ref 98–192)

## 2021-11-04 NOTE — Progress Notes (Signed)
LVAD INR 

## 2021-11-24 ENCOUNTER — Emergency Department (HOSPITAL_COMMUNITY)
Admission: EM | Admit: 2021-11-24 | Discharge: 2021-11-24 | Disposition: A | Discharge status: Home / Self Care | Payer: Medicare Other | Attending: Emergency Medicine | Admitting: Emergency Medicine

## 2021-11-24 ENCOUNTER — Other Ambulatory Visit: Payer: Self-pay

## 2021-11-24 ENCOUNTER — Emergency Department (HOSPITAL_COMMUNITY): Payer: Medicare Other

## 2021-11-24 ENCOUNTER — Encounter (HOSPITAL_COMMUNITY): Payer: Self-pay

## 2021-11-24 ENCOUNTER — Ambulatory Visit (HOSPITAL_COMMUNITY): Payer: Self-pay | Admitting: Pharmacist

## 2021-11-24 DIAGNOSIS — I130 Hypertensive heart and chronic kidney disease with heart failure and stage 1 through stage 4 chronic kidney disease, or unspecified chronic kidney disease: Secondary | ICD-10-CM | POA: Insufficient documentation

## 2021-11-24 DIAGNOSIS — N184 Chronic kidney disease, stage 4 (severe): Secondary | ICD-10-CM | POA: Insufficient documentation

## 2021-11-24 DIAGNOSIS — Z79899 Other long term (current) drug therapy: Secondary | ICD-10-CM | POA: Insufficient documentation

## 2021-11-24 DIAGNOSIS — I2510 Atherosclerotic heart disease of native coronary artery without angina pectoris: Secondary | ICD-10-CM | POA: Insufficient documentation

## 2021-11-24 DIAGNOSIS — I480 Paroxysmal atrial fibrillation: Secondary | ICD-10-CM | POA: Insufficient documentation

## 2021-11-24 DIAGNOSIS — Z7901 Long term (current) use of anticoagulants: Secondary | ICD-10-CM | POA: Insufficient documentation

## 2021-11-24 DIAGNOSIS — E039 Hypothyroidism, unspecified: Secondary | ICD-10-CM | POA: Insufficient documentation

## 2021-11-24 DIAGNOSIS — R55 Syncope and collapse: Secondary | ICD-10-CM

## 2021-11-24 DIAGNOSIS — Z7982 Long term (current) use of aspirin: Secondary | ICD-10-CM | POA: Insufficient documentation

## 2021-11-24 DIAGNOSIS — I5022 Chronic systolic (congestive) heart failure: Secondary | ICD-10-CM | POA: Diagnosis not present

## 2021-11-24 DIAGNOSIS — Z95811 Presence of heart assist device: Secondary | ICD-10-CM

## 2021-11-24 DIAGNOSIS — W19XXXA Unspecified fall, initial encounter: Secondary | ICD-10-CM | POA: Diagnosis not present

## 2021-11-24 LAB — COMPREHENSIVE METABOLIC PANEL
ALT: 13 U/L (ref 0–44)
AST: 19 U/L (ref 15–41)
Albumin: 3.9 g/dL (ref 3.5–5.0)
Alkaline Phosphatase: 63 U/L (ref 38–126)
Anion gap: 8 (ref 5–15)
BUN: 34 mg/dL — ABNORMAL HIGH (ref 8–23)
CO2: 21 mmol/L — ABNORMAL LOW (ref 22–32)
Calcium: 9.1 mg/dL (ref 8.9–10.3)
Chloride: 107 mmol/L (ref 98–111)
Creatinine, Ser: 2.59 mg/dL — ABNORMAL HIGH (ref 0.61–1.24)
GFR, Estimated: 25 mL/min — ABNORMAL LOW (ref >60)
Glucose, Bld: 101 mg/dL — ABNORMAL HIGH (ref 70–99)
Potassium: 4.3 mmol/L (ref 3.5–5.1)
Sodium: 136 mmol/L (ref 135–145)
Total Bilirubin: 0.7 mg/dL (ref 0.3–1.2)
Total Protein: 6.9 g/dL (ref 6.5–8.1)

## 2021-11-24 LAB — CBC WITH DIFFERENTIAL/PLATELET
Abs Immature Granulocytes: 0.01 10*3/uL (ref 0.00–0.07)
Basophils Absolute: 0 10*3/uL (ref 0.0–0.1)
Basophils Relative: 1 %
Eosinophils Absolute: 0.2 10*3/uL (ref 0.0–0.5)
Eosinophils Relative: 4 %
HCT: 34.7 % — ABNORMAL LOW (ref 39.0–52.0)
Hemoglobin: 11 g/dL — ABNORMAL LOW (ref 13.0–17.0)
Immature Granulocytes: 0 %
Lymphocytes Relative: 13 %
Lymphs Abs: 0.7 10*3/uL (ref 0.7–4.0)
MCH: 29.4 pg (ref 26.0–34.0)
MCHC: 31.7 g/dL (ref 30.0–36.0)
MCV: 92.8 fL (ref 80.0–100.0)
Monocytes Absolute: 0.5 10*3/uL (ref 0.1–1.0)
Monocytes Relative: 11 %
Neutro Abs: 3.6 10*3/uL (ref 1.7–7.7)
Neutrophils Relative %: 71 %
Platelets: 157 10*3/uL (ref 150–400)
RBC: 3.74 MIL/uL — ABNORMAL LOW (ref 4.22–5.81)
RDW: 14.7 % (ref 11.5–15.5)
WBC: 5 10*3/uL (ref 4.0–10.5)
nRBC: 0 % (ref 0.0–0.2)

## 2021-11-24 LAB — LACTATE DEHYDROGENASE: LDH: 189 U/L (ref 98–192)

## 2021-11-24 LAB — PROTIME-INR
INR: 2.5 — ABNORMAL HIGH (ref 0.8–1.2)
Prothrombin Time: 26.8 seconds — ABNORMAL HIGH (ref 11.4–15.2)

## 2021-11-24 LAB — TSH: TSH: 4.189 u[IU]/mL (ref 0.350–4.500)

## 2021-11-24 MED ORDER — SODIUM CHLORIDE 0.9 % IV BOLUS
500.0000 mL | Freq: Once | INTRAVENOUS | Status: AC
  Administered 2021-11-24: 500 mL via INTRAVENOUS

## 2021-11-24 MED ORDER — WARFARIN - PHARMACIST DOSING INPATIENT: Freq: Every day | Status: DC

## 2021-11-24 MED ORDER — WARFARIN SODIUM 5 MG PO TABS
5.0000 mg | ORAL_TABLET | Freq: Once | ORAL | Status: DC
  Filled 2021-11-24 (×2): qty 1

## 2021-11-24 NOTE — ED Provider Notes (Signed)
Patient seen by cardiology and LVAD team.  AICD interrogated with no adverse events noted.  Patient remains asymptomatic.  Cardiology recommendation to follow-up on an outpatient basis. ? ?Patient agreeable to plan.  Discharged home in stable condition, advised outpatient follow-up this week with his cardiologist.  Advised immediate return for worsening symptoms or any additional concerns. ?  ?Luna Fuse, MD ?11/24/21 1634 ? ?

## 2021-11-24 NOTE — Consult Note (Addendum)
?  ?Advanced Heart Failure Team Consult Note ? ? ?Primary Physician: Lavone Orn, MD ?PCP-Cardiologist:  None ? ?Reason for Consultation: Syncope ? ?HPI:   ? ?Johnny Oneill is seen today for evaluation of syncope at the request of Dr Alvino Chapel.  ? ?Johnny Oneill is a 72 year with a h/o CAD, chronic HFrEF with HMII LVAD, VT, A flutter, hypothyroidism, HTN, CKD Stage IV, and OA. Intolerant amio dur to tremors /cerebellar side effects ? ?Presented to ER on 06/27/19 with syncopal episode and head injury from fall. ECG showed VT @ 240. Underwent emergent DC-CV in ER. Head CT no bleed. Admitted and had ICD gen replacement (EOL). ?  ?Had ICD shock for VT on 06/09/20. Started on sotalol ?  ?On 02/06/21 Paced out of AFL. ?  ?Seen in ER 05/2021 due to LOW FLOW alarms. Found to be in VT rate 170. DCCV x 1 - returned to [place] with frequent PVCs. Pacer settings adjusted by [name] PA- DDD 90. Post cardioversion persistent LOW FLOWS noted. Received 2 L IVF. Low flows resolved, and he was discharged home.  ?  ? Echo 3/18 EF 10-15% mild AI. Mod RV dysfunction. LVAD cannula OK.  has struggled with RV failure however RHC 11/22 looked good  ? ?Planning for knee replacement next week 4/5.  ? ?Presented to ED after syncopal episode. Earlier this morning he went to store to get jello for his wife. He only had small cup of water and crackers to eat. When he got home and was getting out of the car his wife says he looked weak and pale. He then past out and she  lowered him to the ground. Unconscious a couple of minutes. Woke up without intervention.  A&O x3 . She called 911.  ? ?In the ED he is A&Ox3.   ?  ? Echo 3/18 EF 10-15% mild AI. Mod RV dysfunction. LVAD cannula OK ?- has struggled with RV failure however RHC 11/22 looked good  ? ?LVAD Interrogation HM II: ?Speed: 9600 Flow: 3.9 PI: 5.9 Power: 5.3    31  PI events ? ? ?Review of Systems: [y] = yes, [ ]  = no  ? ?General: Weight gain [ ] ; Weight loss [ ] ; Anorexia [ ] ; Fatigue [Y ];  Fever [ ] ; Chills [ ] ; Weakness [ ]   ?Cardiac: Chest pain/pressure [ ] ; Resting SOB [ ] ; Exertional SOB [ ] ; Orthopnea [ ] ; Pedal Edema [ ] ; Palpitations [ ] ; Syncope [ Y]; Presyncope [ ] ; Paroxysmal nocturnal dyspnea[ ]   ?Pulmonary: Cough [ ] ; Wheezing[ ] ; Hemoptysis[ ] ; Sputum [ ] ; Snoring [ ]   ?GI: Vomiting[ ] ; Dysphagia[ ] ; Melena[ ] ; Hematochezia [ ] ; Heartburn[ ] ; Abdominal pain [ ] ; Constipation [ ] ; Diarrhea [ ] ; BRBPR [ ]   ?GU: Hematuria[ ] ; Dysuria [ ] ; Nocturia[ ]   ?Vascular: Pain in legs with walking [ ] ; Pain in feet with lying flat [ ] ; Non-healing sores [ ] ; Stroke [ ] ; TIA [ ] ; Slurred speech [ ] ;  ?Neuro: Headaches[ ] ; Vertigo[ ] ; Seizures[ ] ; Paresthesias[ ] ;Blurred vision [ ] ; Diplopia [ ] ; Vision changes [ ]   ?Ortho/Skin: Arthritis [ ] ; Joint pain [ Y]; Muscle pain [ ] ; Joint swelling [ ] ; Back Pain [ Y]; Rash [ ]   ?Psych: Depression[ ] ; Anxiety[ ]   ?Heme: Bleeding problems [ ] ; Clotting disorders [ ] ; Anemia [ ]   ?Endocrine: Diabetes [ ] ; Thyroid dysfunction[Y ] ? ?Home Medications ?Prior to Admission medications   ?Medication Sig Start Date End  Date Taking? Authorizing Provider  ?acetaminophen (TYLENOL) 500 MG tablet Take 500 mg by mouth every 6 (six) hours as needed (pain).    [provider]  ?aspirin EC 81 MG tablet Take 81 mg by mouth in the morning.    [provider]  ?cholecalciferol (VITAMIN D3) 25 MCG (1000 UT) tablet Take 1,000 Units by mouth in the morning.    [provider]  ?citalopram (CELEXA) 10 MG tablet Take 1 tablet by mouth once daily 03/19/21   Bensimhon, Shaune Pascal, MD  ?Coenzyme Q10 (COQ10) 200 MG CAPS Take 200 mg by mouth in the morning.    [provider]  ?famotidine (PEPCID) 20 MG tablet Take 20 mg by mouth in the morning.    [provider]  ?furosemide (LASIX) 20 MG tablet Take 1 tablet (20 mg total) by mouth daily as needed (Take 20 mg as daily as needed for wt > 153 lbs). Reported on 01/14/2016 02/21/21   Shirley Friar, PA-C  ?melatonin 5 MG TABS Take 5 mg by mouth at bedtime as needed (sleep).    [provider]  ?mexiletine (MEXITIL) 150 MG capsule Take 1 capsule (150 mg total) by mouth 2 (two) times daily. 06/19/21   Bensimhon, Shaune Pascal, MD  ?neomycin-bacitracin-polymyxin (NEOSPORIN) ointment Apply 1 application. topically as needed for wound care.    [provider]  ?pantoprazole (PROTONIX) 40 MG tablet Take 1 tablet by mouth once daily 08/11/21   Bensimhon, Shaune Pascal, MD  ?potassium chloride SA (KLOR-CON) 20 MEQ tablet Take 1 tablet (20 mEq total) by mouth daily as needed. Take one potassium pill when you take your lasix. 02/21/21   Shirley Friar, PA-C  ?Probiotic Product (PROBIOTIC PO) Take 1 capsule by mouth in the morning.    [provider]  ?rosuvastatin (CRESTOR) 20 MG tablet Take 1 tablet (20 mg total) by mouth daily. 11/26/20   Bensimhon, Shaune Pascal, MD  ?sotalol (BETAPACE) 80 MG tablet TAKE 1 TABLET (80 MG TOTAL) BY MOUTH DAILY. 07/22/21 07/22/22  Bensimhon, Shaune Pascal, MD  ?Wil[place] Floor 175 MCG tablet 1 tablet daily every morning except half tablet on Sundays 10/08/21   Elayne Snare, MD  ?traMADol (ULTRAM) 50 MG tablet Take 1-2 tablets (50-100 mg total) by mouth every 6 (six) hours as needed for moderate pain. ?Patient taking differently: Take 50-100 mg by mouth every 6 (six) hours as needed for moderate pain (knee pain). 04/23/21   Bensimhon, Shaune Pascal, MD  ?traZODone (DESYREL) 50 MG tablet Take 1 tablet (50 mg total) by mouth at bedtime. ?Patient taking differently: Take 50 mg by mouth at bedtime as needed for sleep. 12/31/20   Bensimhon, Shaune Pascal, MD  ?warfarin (COUMADIN) 5 MG tablet Take 5 mg (1 tablet) every Monday/Friday and 7.5 mg (1.5 tablets) all other days OR as directed by HF Clinic ?Patient taking differently: Take 5 mg by mouth daily. 03/20/21   Larey Dresser, MD  ? ? ?Past Medical History: ?Past Medical History:  ?Diagnosis Date  ? Anxiety   ? Automatic implantable  cardioverter-defibrillator in situ   ? CAD (coronary artery disease)   ? S/P stenting of LCX in 2004;  s/p Lat MI 2009 with occlusion of the LCX - treated with Promus stenting. Has total occlusion of the RCA.   ? CHF (congestive heart failure) ([place])   ? Hypercholesteremia   ? Hypertension   ? Hypothyroidism   ? ICD (implantable cardiac defibrillator) in place   ? PROPHYLACTIC  medtronic  ? Inferior MI ([place]) "? date"; 2009  ? Ischemic cardiomyopathy   ? a. 10/09 Echo: Sev LV Dysfxn, inf/lat AK, Mod Johnny.  ? Liver disease   ? LVAD (left ventricular assist device) present ([place])   ? Osteoarthritis   ? a. s/p R TKR  ? Pacemaker   ? PVC (premature ventricular contraction)   ? RBBB   ? Shortness of breath   ? ? ?Past Surgical History: ?Past Surgical History:  ?Procedure Laterality Date  ? CARPAL TUNNEL RELEASE Right 05/29/2016  ? Procedure: CARPAL TUNNEL RELEASE;  Surgeon: Earlie Server, MD;  Location: [facility];  Service: Orthopedics;  Laterality: Right;  ? CORONARY ANGIOPLASTY WITH STENT PLACEMENT  2004  ? CORONARY ANGIOPLASTY WITH STENT PLACEMENT  07/2008  ? DEFIBRILLATOR  2009  ? ESOPHAGOGASTRODUODENOSCOPY N/A 09/15/2013  ? Procedure: ESOPHAGOGASTRODUODENOSCOPY (EGD);  Surgeon: Ladene Artist, MD;  [facility] ENDOSCOPY;  Service: Endoscopy;  Laterality: N/A;  bedside  ? HEMATOMA EVACUATION Right 11/06/2013  ? Procedure: EVACUATION HEMATOMA;  Surgeon: Gaye Pollack, MD;  Location: [place];  Service: Cardiothoracic;  Laterality: Right;  ? ICD GENERATOR CHANGEOUT N/A 06/28/2019  ? Procedure: ICD GENERATOR CHANGEOUT;  Surgeon: Constance Haw, MD;  Location: [place] CV LAB;  Service: Cardiovascular;  Laterality: N/A;  ? INSERT / REPLACE / REMOVE PACEMAKER  2009  ? INSERTION OF IMPLANTABLE LEFT VENTRICULAR ASSIST DEVICE N/A 09/18/2013  ? Procedure: INSERTION OF IMPLANTABLE LEFT VENTRICULAR ASSIST DEVICE;  Surgeon: Ivin Poot, MD;  Location: [place];  Service: Open Heart Surgery;  Laterality: N/A;  CIRC  ARREST ? ?NITRIC OXIDE  ? INTRA-AORTIC BALLOON PUMP INSERTION N/A 09/14/2013  ? Procedure: INTRA-AORTIC BALLOON PUMP INSERTION;  Surgeon: Jolaine Artist, MD;  Location: [facility] CATH LAB;  Service: Cardiovascular;  Laterality: N

## 2021-11-24 NOTE — ED Triage Notes (Signed)
Pt went to the store earlier today, then when he got home his wife witnessed him become pale and weak while getting out of the car. She lowered him to the ground as he had syncopal episode. Unconscious x 3 mins. A/O x 4 now.  ?

## 2021-11-24 NOTE — Progress Notes (Signed)
LVAD INR 

## 2021-11-24 NOTE — Discharge Instructions (Signed)
Call your primary care doctor or specialist as discussed in the next 2-3 days.   Return immediately back to the ER if:  Your symptoms worsen within the next 12-24 hours. You develop new symptoms such as new fevers, persistent vomiting, new pain, shortness of breath, or new weakness or numbness, or if you have any other concerns.  

## 2021-11-24 NOTE — Progress Notes (Addendum)
ANTICOAGULATION CONSULT NOTE - Initial Consult ? ?Pharmacy Consult for warfarin ?Indication:  LVAD - HM2 ? ?Allergies  ?Allergen Reactions  ? Ace Inhibitors   ?  Hypotension and elevated creatinine  ? Amiodarone Other (See Comments)  ?  Cerebellar side effects  ? Diovan [Valsartan] Other (See Comments)  ?  Hypotension at low dose, hyperkalemia  ? Lipitor [Atorvastatin Calcium] Other (See Comments)  ?  Muscle pain  ? Jardiance [Empagliflozin] Other (See Comments)  ?  Unsure of exact side effect  ? Norvasc [Amlodipine] Other (See Comments)  ?  Put patient to sleep  ? ? ?Patient Measurements: ?  ? ?Vital Signs: ?Temp: 98.1 ?F (36.7 ?C) (03/27 1432) ?Temp Source: Oral (03/27 1432) ?BP: 102/84 (03/27 1430) ?Pulse Rate: 80 (03/27 1430) ? ?Labs: ?Recent Labs  ?  11/24/21 ?1507  ?HGB 11.0*  ?HCT 34.7*  ?PLT 157  ?LABPROT 26.8*  ?INR 2.5*  ?CREATININE 2.59*  ? ? ?CrCl cannot be calculated (Unknown ideal weight.). ? ? ?Medical History: ?Past Medical History:  ?Diagnosis Date  ? Anxiety   ? Automatic implantable cardioverter-defibrillator in situ   ? CAD (coronary artery disease)   ? S/P stenting of LCX in 2004;  s/p Lat MI 2009 with occlusion of the LCX - treated with Promus stenting. Has total occlusion of the RCA.   ? CHF (congestive heart failure) ([place])   ? Hypercholesteremia   ? Hypertension   ? Hypothyroidism   ? ICD (implantable cardiac defibrillator) in place   ? PROPHYLACTIC      medtronic  ? Inferior MI ([place]) "? date"; 2009  ? Ischemic cardiomyopathy   ? a. 10/09 Echo: Sev LV Dysfxn, inf/lat AK, Mod MR.  ? Liver disease   ? LVAD (left ventricular assist device) present ([place])   ? Osteoarthritis   ? a. s/p R TKR  ? Pacemaker   ? PVC (premature ventricular contraction)   ? RBBB   ? Shortness of breath   ? ? ?Medications:  ?Scheduled:  ? ?Assessment: ?47 yom who presented after syncopal episode. On warfarin PTA for LVAD - HM2. PTA regimen is 5 mg daily. Last dose 3/26 PM.  ? ?INR is therapeutic at 2.5 today. Hgb 11, plt  157. No s/sx of bleeding. LDH 189. ? ?Goal of Therapy:  ?INR 2.5-3 ?Monitor platelets by anticoagulation protocol: Yes ?  ?Plan:  ?Continue warfarin 5 mg tonight ?Monitor daily INR, CBC, and for s/sx of bleeding ? ?Antonietta Jewel, PharmD, BCCCP ?Clinical Pharmacist  ?Phone: [phone] ?11/24/2021 3:54 PM ? ?Please check AMION for all [place] phone numbers ?After 10:00 PM, call [facility] [phone] ? ? ? ?

## 2021-11-24 NOTE — ED Provider Notes (Signed)
?[facility] ?Provider Note ? ? ?CSN: [id number] ?Arrival date & time: 11/24/21  1423 ? ?  ? ?History ? ?Chief Complaint  ?Patient presents with  ? Loss of Consciousness  ? ? ?Johnny Oneill is a 81 y.o. male. ? ? ?Loss of Consciousness ?Associated symptoms: no chest pain, no confusion and no shortness of breath   ?Patient presents with loss conscious.  Happened after getting out of the car.  Lowered to the ground so no injury.  Does have an LVAD however.  Also has an AICD.  States he has been drinking less.  Had been doing work at [employer].  Feeling better now.  Has a history of getting dehydrated and passing out.  Has previously had V. tach.  No injury.  No chest pain. ?  ? ?Home Medications ?Prior to Admission medications   ?Medication Sig Start Date End Date Taking? Authorizing Provider  ?acetaminophen (TYLENOL) 500 MG tablet Take 500 mg by mouth every 6 (six) hours as needed (pain).    [provider]  ?aspirin EC 81 MG tablet Take 81 mg by mouth in the morning.    [provider]  ?cholecalciferol (VITAMIN D3) 25 MCG (1000 UT) tablet Take 1,000 Units by mouth in the morning.    [provider]  ?citalopram (CELEXA) 10 MG tablet Take 1 tablet by mouth once daily 03/19/21   Bensimhon, Shaune Pascal, MD  ?Coenzyme Q10 (COQ10) 200 MG CAPS Take 200 mg by mouth in the morning.    [provider]  ?famotidine (PEPCID) 20 MG tablet Take 20 mg by mouth in the morning.    [provider]  ?furosemide (LASIX) 20 MG tablet Take 1 tablet (20 mg total) by mouth daily as needed (Take 20 mg as daily as needed for wt > 153 lbs). Reported on 01/14/2016 02/21/21   Shirley Friar, PA-C  ?melatonin 5 MG TABS Take 5 mg by mouth at bedtime as needed (sleep).    [provider]  ?mexiletine (MEXITIL) 150 MG capsule Take 1 capsule (150 mg total) by mouth 2 (two) times daily. 06/19/21   Bensimhon, Shaune Pascal, MD  ?neomycin-bacitracin-polymyxin  (NEOSPORIN) ointment Apply 1 application. topically as needed for wound care.    [provider]  ?pantoprazole (PROTONIX) 40 MG tablet Take 1 tablet by mouth once daily 08/11/21   Bensimhon, Shaune Pascal, MD  ?potassium chloride SA (KLOR-CON) 20 MEQ tablet Take 1 tablet (20 mEq total) by mouth daily as needed. Take one potassium pill when you take your lasix. 02/21/21   Shirley Friar, PA-C  ?Probiotic Product (PROBIOTIC PO) Take 1 capsule by mouth in the morning.    [provider]  ?rosuvastatin (CRESTOR) 20 MG tablet Take 1 tablet (20 mg total) by mouth daily. 11/26/20   Bensimhon, Shaune Pascal, MD  ?sotalol (BETAPACE) 80 MG tablet TAKE 1 TABLET (80 MG TOTAL) BY MOUTH DAILY. 07/22/21 07/22/22  Bensimhon, Shaune Pascal, MD  ?Wilmer Floor 175 MCG tablet 1 tablet daily every morning except half tablet on Sundays 10/08/21   Elayne Snare, MD  ?traMADol (ULTRAM) 50 MG tablet Take 1-2 tablets (50-100 mg total) by mouth every 6 (six) hours as needed for moderate pain. ?Patient taking differently: Take 50-100 mg by mouth every 6 (six) hours as needed for moderate pain (knee pain). 04/23/21   Bensimhon, Shaune Pascal, MD  ?traZODone (DESYREL) 50 MG tablet Take 1 tablet (50 mg total) by mouth at bedtime. ?Patient taking differently:  Take 50 mg by mouth at bedtime as needed for sleep. 12/31/20   Bensimhon, Shaune Pascal, MD  ?warfarin (COUMADIN) 5 MG tablet Take 5 mg (1 tablet) every Monday/Friday and 7.5 mg (1.5 tablets) all other days OR as directed by HF Clinic ?Patient taking differently: Take 5 mg by mouth daily. 03/20/21   Larey Dresser, MD  ?   ? ?Allergies    ?Ace inhibitors, Amiodarone, Diovan [valsartan], Lipitor [atorvastatin calcium], Jardiance [empagliflozin], and Norvasc [amlodipine]   ? ?Review of Systems   ?Review of Systems  ?Constitutional:  Negative for appetite change.  ?Respiratory:  Negative for shortness of breath.   ?Cardiovascular:  Positive for syncope. Negative for chest pain.  ?Musculoskeletal:   Negative for back pain.  ?Psychiatric/Behavioral:  Negative for confusion.   ? ?Physical Exam ?Updated Vital Signs ?BP 102/84   Pulse 80   Temp 98.1 ?F (36.7 ?C) (Oral)   Resp 15   SpO2 99%  ?Physical Exam ?Vitals and nursing note reviewed.  ?Eyes:  ?   Pupils: Pupils are equal, round, and reactive to light.  ?Cardiovascular:  ?   Rate and Rhythm: Regular rhythm.  ?   Comments: LVAD hum. ?Pulmonary:  ?   Breath sounds: No wheezing or rhonchi.  ?Musculoskeletal:  ?   Cervical back: Neck supple.  ?Skin: ?   General: Skin is warm.  ?   Capillary Refill: Capillary refill takes less than 2 seconds.  ?Neurological:  ?   Mental Status: He is alert.  ? ? ?ED Results / Procedures / Treatments   ?Labs ?(all labs ordered are listed, but only abnormal results are displayed) ?Labs Reviewed  ?CBC WITH DIFFERENTIAL/PLATELET - Abnormal; Notable for the following components:  ?    Result Value  ? RBC 3.74 (*)   ? Hemoglobin 11.0 (*)   ? HCT 34.7 (*)   ? All other components within normal limits  ?LACTATE DEHYDROGENASE  ?COMPREHENSIVE METABOLIC PANEL  ?PROTIME-INR  ?TSH  ? ? ?EKG ?EKG Interpretation ? ?Date/Time:  Monday November 24 2021 14:26:46 EDT ?Ventricular Rate:  80 ?PR Interval:  129 ?QRS Duration: 171 ?QT Interval:  478 ?QTC Calculation: 552 ?R Axis:   267 ?Text Interpretation: av pacing Nonspecific IVCD with LAD Anterolateral infarct, age indeterminate Artifact in lead(s) I II III aVR aVL aVF V1 V4 V5 V6 Confirmed by Davonna Belling [phone]) on 11/24/2021 3:38:02 PM ? ?Radiology ?DG Chest Portable 1 View ? ?Result Date: 11/24/2021 ?CLINICAL DATA:  Syncope, LVAD. EXAM: PORTABLE CHEST 1 VIEW COMPARISON:  April 20, 2021. FINDINGS: LEFT-sided dual lead pacer defibrillator remains in place, power pack over LEFT chest. LVAD device projects over the inferior chest. EKG leads project over the chest. Cardiomediastinal contours and hilar structures are stable. No signs of lobar consolidation. No sign of pleural effusion. No visible  pneumothorax. No signs of pulmonary edema. On limited assessment there is no acute skeletal finding. IMPRESSION: No acute cardiopulmonary findings. LVAD and pacer defibrillator as before. Electronically Signed   By: Zetta Bills M.D.   On: 11/24/2021 15:01   ? ?Procedures ?Procedures  ? ? ?Medications Ordered in ED ?Medications  ?sodium chloride 0.9 % bolus 500 mL (500 mLs Intravenous New Bag/Given 11/24/21 1449)  ? ? ?ED Course/ Medical Decision Making/ A&P ?  ?                        ?Medical Decision Making ?Amount and/or Complexity of Data Reviewed ?Labs: ordered. ?Radiology: ordered. ? ? ?  Patient with syncopal episode.  Has an elevated.  Met by elevated team in the recess bay with me.  Has a history of under eating and passing out.  He suspects that this is again.  EKG is paced.  Will interrogate pacemaker.  Chest x-ray independently interpreted and reassuring.  Will get basic blood work.  We will give small fluid bolus.  Hopefully should be able to discharge home.  Care turned over Dr. Almyra Free.  Discussed with Dr. Algernon Huxley from cardiology. ? ? ? ? ? ? ? ?Final Clinical Impression(s) / ED Diagnoses ?Final diagnoses:  ?Syncope, unspecified syncope type  ?LVAD (left ventricular assist device) present ([place])  ? ? ?Rx / DC Orders ?ED Discharge Orders   ? ? None  ? ?  ? ? ?  ?Davonna Belling, MD ?11/24/21 1538 ? ?

## 2021-11-24 NOTE — Progress Notes (Signed)
LVAD Coordinator ED Encounter ? ?Johnny Oneill a 81 y.o. male that presented to [facility] ER today due to syncopal episode. He has a past medical history of Anxiety, Automatic implantable cardioverter-defibrillator in situ, CAD (coronary artery disease), CHF (congestive heart failure) ([place]), Hypercholesteremia, Hypertension, Hypothyroidism, ICD (implantable cardiac defibrillator) in place, Inferior MI ([place]) ("? date"; 2009), Ischemic cardiomyopathy, Liver disease, LVAD (left ventricular assist device) present ([place]), Osteoarthritis, Pacemaker, PVC (premature ventricular contraction), RBBB, and Shortness of breath..  ? ?LVAD is a HM 2 and was implanted on 09/18/13 by [facility].  ? ?Wife paged the VAD pager stating that the pt "blacked out" after getting out of the car today. ? ?Pt reports that he has a "cold", has not eat or drinked today. Dr Aundra Dubin and Darrick Grinder, NP are both at bedside. ? ? ?Vital signs: ?HR: 80 NSR  ?Automated BP:  ?O2 Sat: 99% ? ?LVAD interrogation reveals:  ?Speed: 9600 ?Flow: 3.9 ?Power:  5.3 ?PI: 5.9 ? ?Alarms: none ?Events: 30 daily ? ?Drive Line: CDI. Changed yesterday by wife. Pt provided with 4 weekly dressing kits.  ? ?Significant Events with LVAD: ?Presented to ER on 06/27/19 with syncopal episode and head injury from fall. ECG showed VT @ 240. Underwent emergent DC-CV in ER. Head CT no bleed. Admitted and had ICD gen replacement (EOL). ?  ?Did not tolerate amio due to tremors. ?  ?Had ICD shock for VT on 06/09/20. Started on sotalol ?  ?On 02/06/21 Paced out of AFL. ?  ?Seen in ER 05/2021 due to LOW FLOW alarms. Found to be in VT rate 170. DCCV x 1 - returned to [facility] with frequent PVCs. Pacer settings adjusted by [name] PA- DDD 90. Post cardioversion persistent LOW FLOWS noted. Received 2 L IVF. Low flows resolved, and he was discharged home.  ? ?No LVAD issues and pump is functioning as expected. Able to independently manage LVAD equipment. No LVAD needs at this time. Wife at bedside. ? ? ?Tanda Rockers, RN ?VAD Coordinator  ? ?Office: [phone] ?24/7 Emergency VAD Pager: [phone] ? ?

## 2021-11-25 ENCOUNTER — Encounter (HOSPITAL_COMMUNITY): Payer: Medicare Other

## 2021-11-25 ENCOUNTER — Encounter: Payer: Self-pay | Admitting: Cardiology

## 2021-11-25 NOTE — Care Plan (Signed)
Ortho Bundle Case Management Note ? ?Patient Details  ?Name: Johnny Oneill ?MRN: [id number] ?Date of Birth: [birth year] ? ?Spoke with patient's wife prior to surgery. He will discharge to home with her assistance. Has a rolling walker. CPM ordered from [facility]. HHPT referral to [place] [facility] [place] set up with [facility].  ?Of note - patient has an active LVAD in place and instructions have been given to him. He will be managed by CV for this while in house.  ? ?Patient and MD in agreement with plan. Choice offered  ? ? ?DME Arranged:  CPM ?DME Agency:  [facility] ? ?HH Arranged:  PT ?[facility] Agency:  [facility] ? ?Additional Comments: ?Please contact me with any questions of if this plan should need to change. ? ?Mardelle Matte  [facility] Orthopaedic Specialist  [phone] ?11/25/2021, 3:06 PM ?  ?

## 2021-11-25 NOTE — Progress Notes (Signed)
PERIOPERATIVE PRESCRIPTION FOR IMPLANTED CARDIAC DEVICE PROGRAMMING ? ?Patient Information: ?Name:  Johnny Oneill  ?DOB:  Nov 02, 1940  ?MRN:  [id number]  ?  ?Planned Procedure:  Total Knee Arthroplasty- Right  ?Surgeon:  Dr. Earlie Server  ?Date of Procedure:  12/03/21  ?Cautery will be used.  ?Position during surgery:  supine  ? ?Please send documentation back to:  ?[facility] (Fax # [phone])  ?Device Information: ? ?Clinic EP Physician:  Allegra Lai, MD  ? ?Device Type:  Defibrillator ?Manufacturer and Phone #:  Medtronic: [phone] ?Pacemaker Dependent?:  No. ?Date of Last Device Check:  09/25/2021 (Remote) Normal Device Function?:  Yes.   ? ?Electrophysiologist's Recommendations: ? ?Have magnet available. ?Provide continuous ECG monitoring when magnet is used or reprogramming is to be performed.  ?Procedure should not interfere with device function.  No device programming or magnet placement needed. ? ?Per Device Clinic Standing Orders, ?Simone Curia, RN  ?9:59 AM 11/25/2021  ?

## 2021-11-25 NOTE — Pre-Procedure Instructions (Signed)
Surgical Instructions ? ? ? Your procedure is scheduled on Wednesday, April 5 . ? Report to [facility] Main Entrance "A" at 10:15 A.M., then check in with the Admitting office. ? Call this number if you have problems the morning of surgery: ? [phone] ? ? If you have any questions prior to your surgery date call [phone]: Open Monday-Friday 8am-4pm ? ? ? Remember: ? Do not eat after midnight the night before your surgery ? ?You may drink clear liquids until 9:15 AM the morning of your surgery.   ?Clear liquids allowed are: Water, Non-Citrus Juices (without pulp), Carbonated Beverages, Clear Tea, Black Coffee ONLY (NO MILK, CREAM OR POWDERED CREAMER of any kind), and Gatorade ?  ? Take these medicines the morning of surgery with A SIP OF WATER:  ? ?citalopram (CELEXA)  ?famotidine (PEPCID) ?mexiletine (MEXITIL)  ?sotalol (BETAPACE)  ?pantoprazole (PROTONIX) ?rosuvastatin (CRESTOR) ?SYNTHROID  ?acetaminophen (TYLENOL)  if needed ?traMADol Veatrice Bourbon) if needed ? ?Follow your surgeon's instructions on when to stop Aspirin and Coumadin (warfarin).  If no instructions were given by your surgeon then you will need to call the office to get those instructions.   ? ?As of today, STOP taking any Aleve, Naproxen, Ibuprofen, Motrin, Advil, Goody's, BC's, all herbal medications, fish oil, and all vitamins. ? ?         ?Do not wear jewelry or makeup ?Do not wear lotions, powders, perfumes/colognes, or deodorant. ?Do not shave 48 hours prior to surgery.  Men may shave face and neck. ?Do not bring valuables to the hospital. ?Do not wear nail polish, gel polish, artificial nails, or any other type of covering on natural nails (fingers and toes) ?If you have artificial nails or gel coating that need to be removed by a nail salon, please have this removed prior to surgery. Artificial nails or gel coating may interfere with anesthesia's ability to adequately monitor your vital signs. ? ?[facility] is not responsible for any  belongings or valuables. .  ? ?Do NOT Smoke (Tobacco/Vaping)  24 hours prior to your procedure ? ?If you use a CPAP at night, you may bring your mask for your overnight stay. ?  ?Contacts, glasses, hearing aids, dentures or partials may not be worn into surgery, please bring cases for these belongings ?  ?For patients admitted to the hospital, discharge time will be determined by your treatment team. ?  ?Patients discharged the day of surgery will not be allowed to drive home, and someone needs to stay with them for 24 hours. ? ? ?SURGICAL WAITING ROOM VISITATION ?Patients having surgery or a procedure in a hospital may have two support people. ?Children under the age of 26 must have an adult with them who is not the patient. ?They may stay in the waiting area during the procedure and may switch out with other visitors. If the patient needs to stay at the hospital during part of their recovery, the visitor guidelines for inpatient rooms apply. ? ?Please refer to the [facility] website for the visitor guidelines for Inpatients (after your surgery is over and you are in a regular room).  ? ? ? ? ? ?Special instructions:   ? ?Oral Hygiene is also important to reduce your risk of infection.  Remember - BRUSH YOUR TEETH THE MORNING OF SURGERY WITH YOUR REGULAR TOOTHPASTE ? ? ?[facility]- Preparing For Surgery ? ?Before surgery, you can play an important role. Because skin is not sterile, your skin needs to be as free of  germs as possible. You can reduce the number of germs on your skin by washing with CHG (chlorahexidine gluconate) Soap before surgery.  CHG is an antiseptic cleaner which kills germs and bonds with the skin to continue killing germs even after washing.   ? ? ?Please do not use if you have an allergy to CHG or antibacterial soaps. If your skin becomes reddened/irritated stop using the CHG.  ?Do not shave (including legs and underarms) for at least 48 hours prior to first CHG shower. It is OK to shave your  face. ? ?Please follow these instructions carefully. ?  ? ? Shower the NIGHT BEFORE SURGERY and the MORNING OF SURGERY with CHG Soap.  ? If you chose to wash your hair, wash your hair first as usual with your normal shampoo. After you shampoo, rinse your hair and body thoroughly to remove the shampoo.  Then [employer] and genitals (private parts) with your normal soap and rinse thoroughly to remove soap. ? ?After that Use CHG Soap as you would any other liquid soap. You can apply CHG directly to the skin and wash gently with a scrungie or a clean washcloth.  ? ?Apply the CHG Soap to your body ONLY FROM THE NECK DOWN.  Do not use on open wounds or open sores. Avoid contact with your eyes, ears, mouth and genitals (private parts). Wash Face and genitals (private parts)  with your normal soap.  ? ?Wash thoroughly, paying special attention to the area where your surgery will be performed. ? ?Thoroughly rinse your body with warm water from the neck down. ? ?DO NOT shower/wash with your normal soap after using and rinsing off the CHG Soap. ? ?Pat yourself dry with a CLEAN TOWEL. ? ?Wear CLEAN PAJAMAS to bed the night before surgery ? ?Place CLEAN SHEETS on your bed the night before your surgery ? ?DO NOT SLEEP WITH PETS. ? ? ?Day of Surgery: ? ?Take a shower with CHG soap. ?Wear Clean/Comfortable clothing the morning of surgery ?Do not apply any deodorants/lotions.   ?Remember to brush your teeth WITH YOUR REGULAR TOOTHPASTE. ? ? ? ?If you received a COVID test during your pre-op visit  it is requested that you wear a mask when out in public, stay away from anyone that may not be feeling well and notify your surgeon if you develop symptoms. If you have been in contact with anyone that has tested positive in the last 10 days please notify you surgeon. ? ?  ?Please read over the following fact sheets that you were given.  ? ?

## 2021-11-26 ENCOUNTER — Ambulatory Visit (HOSPITAL_COMMUNITY): Payer: Self-pay | Admitting: Pharmacist

## 2021-11-26 ENCOUNTER — Encounter (HOSPITAL_COMMUNITY): Payer: Self-pay

## 2021-11-26 ENCOUNTER — Other Ambulatory Visit (HOSPITAL_COMMUNITY): Payer: Self-pay | Admitting: *Deleted

## 2021-11-26 ENCOUNTER — Inpatient Hospital Stay (HOSPITAL_COMMUNITY)
Admission: RE | Admit: 2021-11-26 | Discharge: 2021-11-26 | Disposition: A | Discharge status: Home / Self Care | Payer: Medicare Other | Source: Ambulatory Visit

## 2021-11-26 ENCOUNTER — Other Ambulatory Visit: Payer: Self-pay

## 2021-11-26 ENCOUNTER — Ambulatory Visit (HOSPITAL_COMMUNITY)
Admission: RE | Admit: 2021-11-26 | Discharge: 2021-11-26 | Disposition: A | Discharge status: Home / Self Care | Payer: Medicare Other | Source: Ambulatory Visit | Attending: Cardiology | Admitting: Cardiology

## 2021-11-26 VITALS — BP 116/56 | HR 78 | Wt 164.0 lb

## 2021-11-26 DIAGNOSIS — I493 Ventricular premature depolarization: Secondary | ICD-10-CM | POA: Diagnosis not present

## 2021-11-26 DIAGNOSIS — I4892 Unspecified atrial flutter: Secondary | ICD-10-CM | POA: Diagnosis not present

## 2021-11-26 DIAGNOSIS — I472 Ventricular tachycardia, unspecified: Secondary | ICD-10-CM | POA: Diagnosis not present

## 2021-11-26 DIAGNOSIS — I130 Hypertensive heart and chronic kidney disease with heart failure and stage 1 through stage 4 chronic kidney disease, or unspecified chronic kidney disease: Secondary | ICD-10-CM | POA: Insufficient documentation

## 2021-11-26 DIAGNOSIS — Z79899 Other long term (current) drug therapy: Secondary | ICD-10-CM | POA: Diagnosis not present

## 2021-11-26 DIAGNOSIS — E785 Hyperlipidemia, unspecified: Secondary | ICD-10-CM | POA: Insufficient documentation

## 2021-11-26 DIAGNOSIS — Z452 Encounter for adjustment and management of vascular access device: Secondary | ICD-10-CM | POA: Insufficient documentation

## 2021-11-26 DIAGNOSIS — N184 Chronic kidney disease, stage 4 (severe): Secondary | ICD-10-CM | POA: Diagnosis not present

## 2021-11-26 DIAGNOSIS — Z7982 Long term (current) use of aspirin: Secondary | ICD-10-CM | POA: Diagnosis not present

## 2021-11-26 DIAGNOSIS — Z95811 Presence of heart assist device: Secondary | ICD-10-CM | POA: Insufficient documentation

## 2021-11-26 DIAGNOSIS — Z95810 Presence of automatic (implantable) cardiac defibrillator: Secondary | ICD-10-CM | POA: Insufficient documentation

## 2021-11-26 DIAGNOSIS — Z7901 Long term (current) use of anticoagulants: Secondary | ICD-10-CM | POA: Insufficient documentation

## 2021-11-26 DIAGNOSIS — I5043 Acute on chronic combined systolic (congestive) and diastolic (congestive) heart failure: Secondary | ICD-10-CM | POA: Diagnosis not present

## 2021-11-26 DIAGNOSIS — E039 Hypothyroidism, unspecified: Secondary | ICD-10-CM | POA: Diagnosis not present

## 2021-11-26 DIAGNOSIS — I5022 Chronic systolic (congestive) heart failure: Secondary | ICD-10-CM

## 2021-11-26 DIAGNOSIS — I50810 Right heart failure, unspecified: Secondary | ICD-10-CM

## 2021-11-26 DIAGNOSIS — I2510 Atherosclerotic heart disease of native coronary artery without angina pectoris: Secondary | ICD-10-CM | POA: Insufficient documentation

## 2021-11-26 DIAGNOSIS — I255 Ischemic cardiomyopathy: Secondary | ICD-10-CM | POA: Insufficient documentation

## 2021-11-26 DIAGNOSIS — R748 Abnormal levels of other serum enzymes: Secondary | ICD-10-CM | POA: Diagnosis not present

## 2021-11-26 DIAGNOSIS — M1711 Unilateral primary osteoarthritis, right knee: Secondary | ICD-10-CM | POA: Insufficient documentation

## 2021-11-26 LAB — PROTIME-INR
INR: 2.7 — ABNORMAL HIGH (ref 0.8–1.2)
Prothrombin Time: 29 seconds — ABNORMAL HIGH (ref 11.4–15.2)

## 2021-11-26 MED ORDER — WARFARIN SODIUM 5 MG PO TABS: ORAL_TABLET | ORAL | 11 refills | Status: DC

## 2021-11-26 MED ORDER — ROSUVASTATIN CALCIUM 20 MG PO TABS: 20.0000 mg | ORAL_TABLET | Freq: Every day | ORAL | 3 refills | Status: DC

## 2021-11-26 NOTE — Progress Notes (Signed)
? ?VAD CLINIC NOTE ? ? ?HPI:  ?Johnny Oneill is a 81 y.o. 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  ? ?Presented to ER on 06/27/19 with syncopal episode and head injury from fall. ECG showed VT @ 240. Underwent emergent DC-CV in ER. Head CT no bleed. Admitted and had ICD gen replacement (EOL). ? ?Did not tolerate amio due to tremors. ? ?Had ICD shock for VT on 06/09/20. Started on sotalol ? ?On 02/06/21 Paced out of AFL. ? ?Seen in ER 05/2021 due to LOW FLOW alarms. Found to be in VT rate 170. DCCV x 1 - returned to [facility] with frequent PVCs. Pacer settings adjusted by [name] PA- DDD 90. Post cardioversion persistent LOW FLOWS noted. Received 2 L IVF. Low flows resolved, and he was discharged home.  ? ?[place] 11/22 ?RA = 5 ?RV = 33/5 ?PA = 33/13 (20) ?PCW = 10 ?Fick cardiac output/index = 4.0/2.1 ?Thermo CO/CI = 4.9/2.6 ?PVR = 2.0 WU ?Ao sat = 95% ?PA sat = 54%, 59% ?PAPI = 4.0 ? ?He presents today for post-hospital f/u. Seen in ED recently with syncopal episode w/u. Device interrogation and labs ok. Felt to be volume depleted. Has felt well since. Drinking more fluids. No recurrent episodes. Denies orthopnea or PND. No fevers, chills or problems with driveline. No bleeding, melena or neuro symptoms. No VAD alarms. Taking all meds as prescribed.  ? ?  ?VAD Indication: ?Destination therapy- patient choice   ?  ?VAD interrogation & Equipment Management: ?Speed: 1478 ?Flow: 4.2 ?Power: 5.5 w    ?PI: 6.6 ?  ?Alarms: none ?Events: 50-80 PI events daily ? ?Fixed speed 9600  ?Low speed limit: 9000 ?  ?Primary Controller:  Replace back up battery in 11 months ?Back up controller:  Replace back up battery in 26 months ?  ?Annual Equipment Maintenance on UBC/PM was performed 09/23/21 ?  ?I reviewed the LVAD parameters from today and compared the results to the patient's prior recorded data. LVAD interrogation was NEGATIVE for significant  power changes, NEGATIVE for clinical alarms and STABLE for PI events/speed drops. No programming changes were made and pump is functioning within specified parameters. Pt is performing daily controller and system monitor self tests along with completing weekly and monthly maintenance for LVAD equipment. ?  ? ?Past Medical History:  ?Diagnosis Date  ? Anxiety   ? Automatic implantable cardioverter-defibrillator in situ   ? CAD (coronary artery disease)   ? S/P stenting of LCX in 2004;  s/p Lat MI 2009 with occlusion of the LCX - treated with Promus stenting. Has total occlusion of the RCA.   ? CHF (congestive heart failure) ([place])   ? Hypercholesteremia   ? Hypertension   ? Hypothyroidism   ? ICD (implantable cardiac defibrillator) in place   ? PROPHYLACTIC      medtronic  ? Inferior MI ([place]) "? date"; 2009  ? Ischemic cardiomyopathy   ? a. 10/09 Echo: Sev LV Dysfxn, inf/lat AK, Mod MR.  ? Liver disease   ? LVAD (left ventricular assist device) present ([place])   ? Osteoarthritis   ? a. s/p R TKR  ? Pacemaker   ? PVC (premature ventricular contraction)   ? RBBB   ? Shortness of breath   ? ? ?Current Outpatient Medications  ?Medication Sig Dispense Refill  ? acetaminophen (TYLENOL) 500 MG tablet Take 500 mg by mouth every 6 (  six) hours as needed (pain).    ? aspirin EC 81 MG tablet Take 81 mg by mouth in the morning.    ? cholecalciferol (VITAMIN D3) 25 MCG (1000 UT) tablet Take 1,000 Units by mouth in the morning.    ? citalopram (CELEXA) 10 MG tablet Take 1 tablet by mouth once daily 90 tablet 3  ? Coenzyme Q10 (COQ10) 200 MG CAPS Take 200 mg by mouth in the morning.    ? famotidine (PEPCID) 20 MG tablet Take 20 mg by mouth in the morning.    ? melatonin 5 MG TABS Take 5 mg by mouth at bedtime as needed (sleep).    ? mexiletine (MEXITIL) 150 MG capsule Take 1 capsule (150 mg total) by mouth 2 (two) times daily. 60 capsule 6  ? pantoprazole (PROTONIX) 40 MG tablet Take 1 tablet by mouth once daily 90 tablet 3  ?  Probiotic Product (PROBIOTIC PO) Take 1 capsule by mouth in the morning.    ? sotalol (BETAPACE) 80 MG tablet TAKE 1 TABLET (80 MG TOTAL) BY MOUTH DAILY. 90 tablet 3  ? SYNTHROID 175 MCG tablet 1 tablet daily every morning except half tablet on Sundays (Patient taking differently: 1 tablet daily every morning) 30 tablet 3  ? traMADol (ULTRAM) 50 MG tablet Take 1-2 tablets (50-100 mg total) by mouth every 6 (six) hours as needed for moderate pain. (Patient taking differently: Take 50-100 mg by mouth every 6 (six) hours as needed for moderate pain (knee pain).) 45 tablet 5  ? traZODone (DESYREL) 50 MG tablet Take 1 tablet (50 mg total) by mouth at bedtime. (Patient taking differently: Take 50 mg by mouth at bedtime as needed for sleep.) 30 tablet 5  ? furosemide (LASIX) 20 MG tablet Take 1 tablet (20 mg total) by mouth daily as needed (Take 20 mg as daily as needed for wt > 153 lbs). Reported on 01/14/2016 (Patient not taking: Reported on 11/26/2021) 30 tablet 6  ? neomycin-bacitracin-polymyxin (NEOSPORIN) ointment Apply 1 application. topically as needed for wound care. (Patient not taking: Reported on 11/26/2021)    ? potassium chloride SA (KLOR-CON) 20 MEQ tablet Take 1 tablet (20 mEq total) by mouth daily as needed. Take one potassium pill when you take your lasix. (Patient not taking: Reported on 11/26/2021) 30 tablet 6  ? rosuvastatin (CRESTOR) 20 MG tablet Take 1 tablet (20 mg total) by mouth daily. 90 tablet 3  ? warfarin (COUMADIN) 5 MG tablet Take 5 mg (1 tablet) every day OR as directed by HF Clinic 135 tablet 11  ? ?No current facility-administered medications for this encounter.  ? ?Facility-Administered Medications Ordered in Other Encounters  ?Medication Dose Route Frequency Provider Last Rate Last Admin  ? etomidate (AMIDATE) injection    Anesthesia Intra-op Myna Bright, CRNA   20 mg at 02/08/14 0915  ? rocuronium [facility]) injection    Anesthesia Intra-op Myna Bright, CRNA   50 mg at  02/08/14 0932  ? succinylcholine (ANECTINE) injection    Anesthesia Intra-op Myna Bright, CRNA   100 mg at 02/08/14 0915  ? ?Ace inhibitors, Amiodarone, Diovan [valsartan], Lipitor [atorvastatin calcium], Jardiance [empagliflozin], and Norvasc [amlodipine] ? ?Review of systems complete and found to be negative unless listed in HPI.   ? ?  ?Vital Signs:  ?Doppler Pressure: 84 ?Vitals:  ? 11/26/21 0910 11/26/21 0915  ?BP: (!) 126/0 (!) 116/56  ?Pulse: 78   ?SpO2: 98%   ? ? ?Vital Signs:   ?Temp:  ?  Doppler Pressure: 126 ?Automatc BP: 116/56 (70)       ?HR: 78 ?SPO2: 98% on RA ?  ?Weight: 164 lb w/ eqt ?Last weight: 170 lb ? ?Physical exam: ?General:  NAD.  ?HEENT: normal  ?Neck: supple. JVP 7  Carotids 2+ bilat; no bruits. No lymphadenopathy or thryomegaly appreciated. ?Cor: LVAD hum.  ?Lungs: Clear. ?Abdomen: soft, nontender, non-distended. No hepatosplenomegaly. No bruits or masses. Good bowel sounds. Driveline site clean. Anchor in place.  ?Extremities: no cyanosis, clubbing, rash. Warm no edema  ?Neuro: alert & oriented x 3. No focal deficits. Moves all 4 without problem  ? ? ? ?ASSESSMENT AND PLAN:  ? ?1) Chronic systolic HF: ICM, EF 46-65% s/p LVAD HM II implant 08/2013 for DT.   ?- Echo 3/18 EF 10-15% mild AI. Mod RV dysfunction. LVAD cannula OK ?- has struggled with RV failure however RHC 11/22 looked good (despite evidence of RVF on LE dopplers showing reflux ?- Stable NYHA II-early III ?- Recent presyncopal episode likely due to volume depletion. Encouraged to keep up intake. Volume status ok today ?- Remains off all diuretics including spiro ?- Failed Jardiance due to volume depletion ?- He has not tolerated ACE-I or amlodipine or b-blocker in the past. ? ?2) LVAD: HMII 08/2013 for DT. ?- VAD interrogated personally. Parameters stable. ?- Admitted in May 2018 with elevated LDH with bival.  ?- Continue aspirin and coumadin.  ?- Hgb stable at 11.0  ?- LDH 189 ?- DL site looks good ?- MAPs OK  ?- Continue  warfarin INR 2.5 Goal 2.0-3.0. Hold warfarin starting Friday for knee surgery next week ? ?3) VT/Frequent PVCs ?- now s/p ICD gen change.  ?- Off amio due to cerebellar side effects.  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 Now on s

## 2021-11-26 NOTE — Progress Notes (Addendum)
Patient presents for 2 mo f/u  in [facility] today alone. Reports no problems with VAD equipment or concerns with drive line.  ? ?Patient reports he has been doing well since he was seen in the ER on Monday after a syncopal episode. Denies any palpitations, dizziness, syncope, or signs of bleeding since ER visit. Discussed importance of drinking at least 2 L per day. He verbalized understanding.  ? ?Weight is down 6 lbs. He reports he is not eating sweets.  ? ?Chief complaint today is right knee pain. He is scheduled for right total knee replacement 12/03/21 with Dr French Ana. INR goal 1.3 for surgery. INR today 2.7. Per Dr Haroldine Laws will hold Coumadin starting Friday 11/28/21. See Lauren PharmD's note for further details. Pt will confirm with Dr French Ana whether or not he needs to hold ASA. Plan to admit pt on Monday 12/01/21.  ? ?Full labs drawn in ER in 11/24/21. ? ?Refills for Crestor and Warfarin sent to pt's pharmacy.  ? ?Vital Signs:   ?Temp:  ?Doppler Pressure: 126 ?Automatc BP: 116/56 (70)  ?HR: 78 ?SPO2: 98% on RA ?  ?Weight: 164 lb w/ eqt ?Last weight: 170 lb ? ?VAD Indication: ?Destination therapy- patient choice   ?  ?VAD interrogation & Equipment Management: ?Speed: 8563 ?Flow: 4.2 ?Power: 5.5 w    ?PI: 6.6 ?  ?Alarms: none ?Events: 50-80 PI events daily ? ?Fixed speed 9600  ?Low speed limit: 9000 ?  ?Primary Controller:  Replace back up battery in 11 months ?Back up controller:  Replace back up battery in 26 months ?  ?Annual Equipment Maintenance on UBC/PM was performed 09/23/21 ?  ?I reviewed the LVAD parameters from today and compared the results to the patient's prior recorded data. LVAD interrogation was NEGATIVE for significant power changes, NEGATIVE for clinical alarms and STABLE for PI events/speed drops. No programming changes were made and pump is functioning within specified parameters. Pt is performing daily controller and system monitor self tests along with completing weekly and monthly  maintenance for LVAD equipment. ? ?Exit Site Care: ?Drive line is being maintained weekly by [name], his wife. Existing VAD dressing C/D/I Sorbaview dressing and Silverlon patch intact. Exit site healed and incorporated, the velour is fully implanted at exit site. Pt denies fever or chills. Pt has adequate supplies at home.  ? ?Significant Events on VAD Support:  ?10/2013>SBO ?12/2016> elevated LDH, admit for Bival ?  ?Device: Dual Medtronic ?Therapies:  on 231  ?Pacing: AAI<=>DDD 80 bpm  ?Last check:  06/26/21 ? ?BP & Labs:  ?Doppler 126 - Doppler is reflecting modified systolic  ? ?Hgb 11.0 in ER 11/24/21 - No S/S of bleeding. Specifically denies melena/BRBPR or nosebleeds. ?  ?LDH 189 in ER 11/24/21 - established baseline of 200- 350. Denies tea-colored urine. No power elevations noted on interrogation.  ? ? ?Patient Instructions:  ?No medication changes today ?Coumadin dosing per Ander Purpura PharmD- Lauren will call you with Coumadin stop date, and any further instructions regarding Coumadin dosing before surgery ?We will admit you on Monday 12/01/21  for your knee replacement scheduled on Wednesday 12/03/21 ?Return to [place] clinic in 2 months for f/u with Dr Haroldine Laws ? ?Emerson Monte RN ?VAD Coordinator  ?Office: [phone]  ?24/7 Pager: [phone]  ? ? ? ? ? ? ?

## 2021-11-26 NOTE — Progress Notes (Signed)
LVAD INR 

## 2021-11-26 NOTE — Patient Instructions (Addendum)
No medication changes today ?Coumadin dosing per Ander Purpura PharmD- Lauren will call you with Coumadin stop date, and any further instructions regarding Coumadin dosing before surgery ?We will admit you on Monday 12/01/21  for your knee replacement scheduled on Wednesday 12/03/21 ?Return to [facility] clinic in 2 months for f/u with Dr Haroldine Laws ?

## 2021-12-01 ENCOUNTER — Other Ambulatory Visit: Payer: Self-pay

## 2021-12-01 ENCOUNTER — Inpatient Hospital Stay (HOSPITAL_COMMUNITY)
Admission: RE | Admit: 2021-12-01 | Discharge: 2021-12-12 | DRG: 470 | Disposition: A | Discharge status: Home Health | Payer: Medicare Other | Attending: Internal Medicine | Admitting: Internal Medicine

## 2021-12-01 ENCOUNTER — Encounter (HOSPITAL_COMMUNITY): Payer: Self-pay | Admitting: Orthopedic Surgery

## 2021-12-01 ENCOUNTER — Other Ambulatory Visit: Payer: Medicare Other

## 2021-12-01 DIAGNOSIS — I5022 Chronic systolic (congestive) heart failure: Secondary | ICD-10-CM | POA: Diagnosis not present

## 2021-12-01 DIAGNOSIS — G8929 Other chronic pain: Secondary | ICD-10-CM

## 2021-12-01 DIAGNOSIS — I4510 Unspecified right bundle-branch block: Secondary | ICD-10-CM | POA: Diagnosis present

## 2021-12-01 DIAGNOSIS — Z79899 Other long term (current) drug therapy: Secondary | ICD-10-CM | POA: Diagnosis not present

## 2021-12-01 DIAGNOSIS — Z79890 Hormone replacement therapy (postmenopausal): Secondary | ICD-10-CM

## 2021-12-01 DIAGNOSIS — M1711 Unilateral primary osteoarthritis, right knee: Principal | ICD-10-CM | POA: Diagnosis present

## 2021-12-01 DIAGNOSIS — E669 Obesity, unspecified: Secondary | ICD-10-CM | POA: Diagnosis not present

## 2021-12-01 DIAGNOSIS — I2510 Atherosclerotic heart disease of native coronary artery without angina pectoris: Secondary | ICD-10-CM | POA: Diagnosis present

## 2021-12-01 DIAGNOSIS — I4892 Unspecified atrial flutter: Secondary | ICD-10-CM | POA: Diagnosis not present

## 2021-12-01 DIAGNOSIS — M1712 Unilateral primary osteoarthritis, left knee: Secondary | ICD-10-CM

## 2021-12-01 DIAGNOSIS — D631 Anemia in chronic kidney disease: Secondary | ICD-10-CM | POA: Diagnosis not present

## 2021-12-01 DIAGNOSIS — M1990 Unspecified osteoarthritis, unspecified site: Principal | ICD-10-CM | POA: Diagnosis present

## 2021-12-01 DIAGNOSIS — I252 Old myocardial infarction: Secondary | ICD-10-CM | POA: Diagnosis not present

## 2021-12-01 DIAGNOSIS — M25761 Osteophyte, right knee: Secondary | ICD-10-CM | POA: Diagnosis not present

## 2021-12-01 DIAGNOSIS — I951 Orthostatic hypotension: Secondary | ICD-10-CM | POA: Diagnosis not present

## 2021-12-01 DIAGNOSIS — Z95811 Presence of heart assist device: Secondary | ICD-10-CM

## 2021-12-01 DIAGNOSIS — I493 Ventricular premature depolarization: Secondary | ICD-10-CM | POA: Diagnosis present

## 2021-12-01 DIAGNOSIS — Z95810 Presence of automatic (implantable) cardiac defibrillator: Secondary | ICD-10-CM | POA: Diagnosis present

## 2021-12-01 DIAGNOSIS — M21161 Varus deformity, not elsewhere classified, right knee: Secondary | ICD-10-CM | POA: Diagnosis present

## 2021-12-01 DIAGNOSIS — K769 Liver disease, unspecified: Secondary | ICD-10-CM | POA: Diagnosis present

## 2021-12-01 DIAGNOSIS — F419 Anxiety disorder, unspecified: Secondary | ICD-10-CM | POA: Diagnosis present

## 2021-12-01 DIAGNOSIS — Z7982 Long term (current) use of aspirin: Secondary | ICD-10-CM

## 2021-12-01 DIAGNOSIS — Z955 Presence of coronary angioplasty implant and graft: Secondary | ICD-10-CM

## 2021-12-01 DIAGNOSIS — D62 Acute posthemorrhagic anemia: Secondary | ICD-10-CM | POA: Diagnosis not present

## 2021-12-01 DIAGNOSIS — I255 Ischemic cardiomyopathy: Secondary | ICD-10-CM | POA: Diagnosis present

## 2021-12-01 DIAGNOSIS — N184 Chronic kidney disease, stage 4 (severe): Secondary | ICD-10-CM | POA: Diagnosis present

## 2021-12-01 DIAGNOSIS — G8918 Other acute postprocedural pain: Secondary | ICD-10-CM | POA: Diagnosis not present

## 2021-12-01 DIAGNOSIS — Z7901 Long term (current) use of anticoagulants: Secondary | ICD-10-CM | POA: Diagnosis not present

## 2021-12-01 DIAGNOSIS — I472 Ventricular tachycardia, unspecified: Secondary | ICD-10-CM | POA: Diagnosis present

## 2021-12-01 DIAGNOSIS — E7800 Pure hypercholesterolemia, unspecified: Secondary | ICD-10-CM | POA: Diagnosis present

## 2021-12-01 DIAGNOSIS — Z83438 Family history of other disorder of lipoprotein metabolism and other lipidemia: Secondary | ICD-10-CM

## 2021-12-01 DIAGNOSIS — I509 Heart failure, unspecified: Secondary | ICD-10-CM | POA: Diagnosis not present

## 2021-12-01 DIAGNOSIS — M158 Other polyosteoarthritis: Secondary | ICD-10-CM

## 2021-12-01 DIAGNOSIS — E890 Postprocedural hypothyroidism: Secondary | ICD-10-CM | POA: Diagnosis present

## 2021-12-01 DIAGNOSIS — I130 Hypertensive heart and chronic kidney disease with heart failure and stage 1 through stage 4 chronic kidney disease, or unspecified chronic kidney disease: Secondary | ICD-10-CM | POA: Diagnosis present

## 2021-12-01 DIAGNOSIS — Z888 Allergy status to other drugs, medicaments and biological substances status: Secondary | ICD-10-CM

## 2021-12-01 DIAGNOSIS — N189 Chronic kidney disease, unspecified: Secondary | ICD-10-CM | POA: Diagnosis not present

## 2021-12-01 DIAGNOSIS — Z8249 Family history of ischemic heart disease and other diseases of the circulatory system: Secondary | ICD-10-CM

## 2021-12-01 LAB — BASIC METABOLIC PANEL
Anion gap: 6 (ref 5–15)
BUN: 41 mg/dL — ABNORMAL HIGH (ref 8–23)
CO2: 23 mmol/L (ref 22–32)
Calcium: 8.9 mg/dL (ref 8.9–10.3)
Chloride: 107 mmol/L (ref 98–111)
Creatinine, Ser: 2.49 mg/dL — ABNORMAL HIGH (ref 0.61–1.24)
GFR, Estimated: 26 mL/min — ABNORMAL LOW (ref >60)
Glucose, Bld: 106 mg/dL — ABNORMAL HIGH (ref 70–99)
Potassium: 5.1 mmol/L (ref 3.5–5.1)
Sodium: 136 mmol/L (ref 135–145)

## 2021-12-01 LAB — CBC
HCT: 30.9 % — ABNORMAL LOW (ref 39.0–52.0)
Hemoglobin: 10.2 g/dL — ABNORMAL LOW (ref 13.0–17.0)
MCH: 30.2 pg (ref 26.0–34.0)
MCHC: 33 g/dL (ref 30.0–36.0)
MCV: 91.4 fL (ref 80.0–100.0)
Platelets: 170 10*3/uL (ref 150–400)
RBC: 3.38 MIL/uL — ABNORMAL LOW (ref 4.22–5.81)
RDW: 14.8 % (ref 11.5–15.5)
WBC: 5.5 10*3/uL (ref 4.0–10.5)
nRBC: 0 % (ref 0.0–0.2)

## 2021-12-01 LAB — LACTATE DEHYDROGENASE: LDH: 249 U/L — ABNORMAL HIGH (ref 98–192)

## 2021-12-01 LAB — SURGICAL PCR SCREEN
MRSA, PCR: NEGATIVE
Staphylococcus aureus: NEGATIVE

## 2021-12-01 LAB — MAGNESIUM: Magnesium: 1.9 mg/dL (ref 1.7–2.4)

## 2021-12-01 LAB — PROTIME-INR
INR: 1.4 — ABNORMAL HIGH (ref 0.8–1.2)
Prothrombin Time: 17.1 seconds — ABNORMAL HIGH (ref 11.4–15.2)

## 2021-12-01 MED ORDER — ONDANSETRON HCL 4 MG/2ML IJ SOLN
4.0000 mg | Freq: Four times a day (QID) | INTRAMUSCULAR | Status: DC | PRN
Start: 2021-12-01 — End: 2021-12-12
  Administered 2021-12-03: 4 mg via INTRAVENOUS

## 2021-12-01 MED ORDER — PANTOPRAZOLE SODIUM 40 MG PO TBEC
40.0000 mg | DELAYED_RELEASE_TABLET | Freq: Every day | ORAL | Status: DC
  Administered 2021-12-02 – 2021-12-12 (×11): 40 mg via ORAL
  Filled 2021-12-01 (×11): qty 1

## 2021-12-01 MED ORDER — CITALOPRAM HYDROBROMIDE 20 MG PO TABS
10.0000 mg | ORAL_TABLET | Freq: Every day | ORAL | Status: DC
  Administered 2021-12-02 – 2021-12-12 (×11): 10 mg via ORAL
  Filled 2021-12-01 (×11): qty 1

## 2021-12-01 MED ORDER — SODIUM CHLORIDE 0.9 % IV SOLN: 250.0000 mL | INTRAVENOUS | Status: DC | PRN

## 2021-12-01 MED ORDER — SODIUM CHLORIDE 0.9% FLUSH
3.0000 mL | Freq: Two times a day (BID) | INTRAVENOUS | Status: DC
Start: 2021-12-01 — End: 2021-12-12
  Administered 2021-12-03 – 2021-12-11 (×14): 3 mL via INTRAVENOUS

## 2021-12-01 MED ORDER — LEVOTHYROXINE SODIUM 75 MCG PO TABS
175.0000 ug | ORAL_TABLET | Freq: Every day | ORAL | Status: DC
  Administered 2021-12-02 – 2021-12-12 (×11): 175 ug via ORAL
  Filled 2021-12-01 (×11): qty 1

## 2021-12-01 MED ORDER — MELATONIN 5 MG PO TABS
5.0000 mg | ORAL_TABLET | Freq: Every evening | ORAL | Status: DC | PRN
  Administered 2021-12-01 – 2021-12-05 (×5): 5 mg via ORAL
  Filled 2021-12-01 (×5): qty 1

## 2021-12-01 MED ORDER — HEPARIN (PORCINE) 25000 UT/250ML-% IV SOLN
1050.0000 [IU]/h | INTRAVENOUS | Status: DC
  Administered 2021-12-01: 850 [IU]/h via INTRAVENOUS
  Administered 2021-12-02: 1050 [IU]/h via INTRAVENOUS
  Filled 2021-12-01 (×2): qty 250

## 2021-12-01 MED ORDER — ACETAMINOPHEN 500 MG PO TABS: 500.0000 mg | ORAL_TABLET | Freq: Four times a day (QID) | ORAL | Status: DC | PRN

## 2021-12-01 MED ORDER — ROSUVASTATIN CALCIUM 20 MG PO TABS
20.0000 mg | ORAL_TABLET | Freq: Every evening | ORAL | Status: DC
  Administered 2021-12-01 – 2021-12-11 (×11): 20 mg via ORAL
  Filled 2021-12-01 (×11): qty 1

## 2021-12-01 MED ORDER — ACETAMINOPHEN 325 MG PO TABS
650.0000 mg | ORAL_TABLET | ORAL | Status: DC | PRN
  Administered 2021-12-08 – 2021-12-10 (×2): 650 mg via ORAL
  Filled 2021-12-01 (×2): qty 2

## 2021-12-01 MED ORDER — SOTALOL HCL 80 MG PO TABS
80.0000 mg | ORAL_TABLET | Freq: Every day | ORAL | Status: DC
  Administered 2021-12-01 – 2021-12-12 (×12): 80 mg via ORAL
  Filled 2021-12-01 (×12): qty 1

## 2021-12-01 MED ORDER — MEXILETINE HCL 150 MG PO CAPS
150.0000 mg | ORAL_CAPSULE | Freq: Two times a day (BID) | ORAL | Status: DC
  Administered 2021-12-01 – 2021-12-12 (×22): 150 mg via ORAL
  Filled 2021-12-01 (×22): qty 1

## 2021-12-01 MED ORDER — MAGNESIUM HYDROXIDE 400 MG/5ML PO SUSP: 30.0000 mL | Freq: Every day | ORAL | Status: DC | PRN

## 2021-12-01 MED ORDER — ASPIRIN EC 81 MG PO TBEC
81.0000 mg | DELAYED_RELEASE_TABLET | Freq: Every morning | ORAL | Status: DC
  Administered 2021-12-02 – 2021-12-12 (×10): 81 mg via ORAL
  Filled 2021-12-01 (×10): qty 1

## 2021-12-01 NOTE — Progress Notes (Signed)
ANTICOAGULATION CONSULT NOTE - Initial Consult ? ?Pharmacy Consult for heparin ?Indication: LVAD ? ?Allergies  ?Allergen Reactions  ? Ace Inhibitors   ?  Hypotension and elevated creatinine  ? Amiodarone Other (See Comments)  ?  Cerebellar side effects  ? Diovan [Valsartan] Other (See Comments)  ?  Hypotension at low dose, hyperkalemia  ? Lipitor [Atorvastatin Calcium] Other (See Comments)  ?  Muscle pain  ? Jardiance [Empagliflozin] Other (See Comments)  ?  Unsure of exact side effect  ? Norvasc [Amlodipine] Other (See Comments)  ?  Put patient to sleep  ? ? ?Patient Measurements: ?Height: 5\' 7"  (170.2 cm) ?Weight: 71.1 kg (156 lb 12 oz) ?IBW/kg (Calculated) : 66.1 ? ?Vital Signs: ?Temp: 97.9 ?F (36.6 ?C) (04/03 1600) ?Temp Source: Oral (04/03 1600) ?BP: 117/93 (04/03 1600) ?Pulse Rate: 80 (04/03 1600) ? ?Labs: ?Recent Labs  ?  12/01/21 ?9357  ?HGB 10.2*  ?HCT 30.9*  ?PLT 170  ?LABPROT 17.1*  ?INR 1.4*  ?CREATININE 2.49*  ? ? ?Estimated Creatinine Clearance: 22.9 mL/min (A) (by C-G formula based on SCr of 2.49 mg/dL (H)). ? ? ?Medical History: ?Past Medical History:  ?Diagnosis Date  ? Anxiety   ? Automatic implantable cardioverter-defibrillator in situ   ? CAD (coronary artery disease)   ? S/P stenting of LCX in 2004;  s/p Lat MI 2009 with occlusion of the LCX - treated with Promus stenting. Has total occlusion of the RCA.   ? CHF (congestive heart failure) ([place])   ? Hypercholesteremia   ? Hypertension   ? Hypothyroidism   ? ICD (implantable cardiac defibrillator) in place   ? PROPHYLACTIC      medtronic  ? Inferior MI ([place]) "? date"; 2009  ? Ischemic cardiomyopathy   ? a. 10/09 Echo: Sev LV Dysfxn, inf/lat AK, Mod MR.  ? Liver disease   ? LVAD (left ventricular assist device) present ([place])   ? Osteoarthritis   ? a. s/p R TKR  ? Pacemaker   ? PVC (premature ventricular contraction)   ? RBBB   ? Shortness of breath   ? ? ?Medications:  ?Medications Prior to Admission  ?Medication Sig Dispense Refill Last Dose  ?  acetaminophen (TYLENOL) 500 MG tablet Take 500 mg by mouth every 6 (six) hours as needed (pain).     ? aspirin EC 81 MG tablet Take 81 mg by mouth in the morning.     ? cholecalciferol (VITAMIN D3) 25 MCG (1000 UT) tablet Take 1,000 Units by mouth in the morning.     ? citalopram (CELEXA) 10 MG tablet Take 1 tablet by mouth once daily 90 tablet 3   ? Coenzyme Q10 (COQ10) 200 MG CAPS Take 200 mg by mouth in the morning.     ? famotidine (PEPCID) 20 MG tablet Take 20 mg by mouth in the morning.     ? furosemide (LASIX) 20 MG tablet Take 1 tablet (20 mg total) by mouth daily as needed (Take 20 mg as daily as needed for wt > 153 lbs). Reported on 01/14/2016 (Patient not taking: Reported on 11/26/2021) 30 tablet 6   ? melatonin 5 MG TABS Take 5 mg by mouth at bedtime as needed (sleep).     ? mexiletine (MEXITIL) 150 MG capsule Take 1 capsule (150 mg total) by mouth 2 (two) times daily. 60 capsule 6   ? neomycin-bacitracin-polymyxin (NEOSPORIN) ointment Apply 1 application. topically as needed for wound care. (Patient not taking: Reported on 11/26/2021)     ?  pantoprazole (PROTONIX) 40 MG tablet Take 1 tablet by mouth once daily 90 tablet 3   ? potassium chloride SA (KLOR-CON) 20 MEQ tablet Take 1 tablet (20 mEq total) by mouth daily as needed. Take one potassium pill when you take your lasix. (Patient not taking: Reported on 11/26/2021) 30 tablet 6   ? Probiotic Product (PROBIOTIC PO) Take 1 capsule by mouth in the morning.     ? sotalol (BETAPACE) 80 MG tablet TAKE 1 TABLET (80 MG TOTAL) BY MOUTH DAILY. 90 tablet 3   ? SYNTHROID 175 MCG tablet 1 tablet daily every morning except half tablet on Sundays (Patient taking differently: 1 tablet daily every morning) 30 tablet 3   ? traMADol (ULTRAM) 50 MG tablet Take 1-2 tablets (50-100 mg total) by mouth every 6 (six) hours as needed for moderate pain. (Patient taking differently: Take 50-100 mg by mouth every 6 (six) hours as needed for moderate pain (knee pain).) 45 tablet 5    ? traZODone (DESYREL) 50 MG tablet Take 1 tablet (50 mg total) by mouth at bedtime. (Patient taking differently: Take 50 mg by mouth at bedtime as needed for sleep.) 30 tablet 5   ? rosuvastatin (CRESTOR) 20 MG tablet Take 1 tablet (20 mg total) by mouth daily. 90 tablet 3   ? warfarin (COUMADIN) 5 MG tablet Take 5 mg (1 tablet) every day OR as directed by [facility] Clinic 135 tablet 11   ? ?Scheduled:  ? [START ON 12/02/2021] aspirin EC  81 mg Oral q AM  ? [START ON 12/02/2021] citalopram  10 mg Oral Daily  ? [START ON 12/02/2021] levothyroxine  175 mcg Oral Q0600  ? mexiletine  150 mg Oral BID  ? [START ON 12/02/2021] pantoprazole  40 mg Oral Daily  ? rosuvastatin  20 mg Oral QPM  ? sodium chloride flush  3 mL Intravenous Q12H  ? sotalol  80 mg Oral Daily  ? ? ?Assessment: ?81 yo male on warfarin PTA for LVAD (placed 08/2013). Plans are for R TKR on 4/5 (last dose of warfarin given 3/30). Pharmacy consulted to dose heparin.  ?-INR= 1.4 ? ?Goal of Therapy:  ?INR goal 2.5-3.0 ?Heparin level 0.3-0.5 ?Monitor platelets by anticoagulation protocol: Yes ?  ?Plan:  ?-No heparin bolus ?-Start heparin infusion at 850 units/hr ?-Heparin level in 8 hours and daily wth CBC daily ? ?Hildred Laser, PharmD ?Clinical Pharmacist ?**Pharmacist phone directory can now be found on [email or web address] ([email or web address]).  Listed under [facility]. ? ? ? ? ?

## 2021-12-01 NOTE — H&P (Addendum)
?    ?Advanced Heart Failure VAD History and Physical Note  ? ?PCP-Cardiologist: None  ?HF Cardiologist: Dr. Haroldine Laws ? ?Reason for Admission: Optimization/heparing bridge prior to scheduled knee replacement.  ? ?HPI:   ? ?[name] is a 42 year with a h/o CAD, chronic HFrEF with HMII LVAD, VT, A flutter, hypothyroidism, HTN, CKD Stage IV, and OA. Intolerant amio dur to tremors /cerebellar side effects  ? ?Presented to ER on 06/27/19 with syncopal episode and head injury from fall. ECG showed VT @ 240. Underwent emergent DC-CV in ER. Head CT no bleed. Admitted and had ICD gen replacement (EOL).  ?   ?Had ICD shock for VT on 06/09/20. Started on sotalol  ?   ?On 02/06/21 Paced out of AFL.  ?   ?Seen in ER 05/2021 due to LOW FLOW alarms. Found to be in VT rate 170. DCCV x 1 - returned to [facility] with frequent PVCs. Pacer settings adjusted by [name] PA- DDD 90. Post cardioversion persistent LOW FLOWS noted. Received 2 L IVF. Low flows resolved, and he was discharged home.   ?   ? Echo 3/18 EF 10-15% mild AI. Mod RV dysfunction. LVAD cannula OK.  has struggled with RV failure however RHC 11/22 looked good  ? ?Presented to ED 11/24/21 after syncopal episode. Work up negative for arrhythmias. Felt to be in the setting dehydration. Given IVF and later discharged to home.  ? ?Presents today for scheduled admit for optimization/heparin bridge prior to right total knee replacement on 04/05 by Dr. French Ana. Last dose of coumadin 03/30. ? ?Feeling well. No complaints. No recurrent syncopal episodes. ? ?LVAD INTERROGATION:  ?HeartMate II LVAD:  Flow 4.1 liters/min, speed 9600 power 5.5, PI 6.7.   ? ? ?Review of Systems: [y] = yes, [ ]  = no  ? ?General: Weight gain [ ] ; Weight loss [ ] ; Anorexia [ ] ; Fatigue [ ] ; Fever [ ] ; Chills [ ] ; Weakness [ ]   ?Cardiac: Chest pain/pressure [ ] ; Resting SOB [ ] ; Exertional SOB [ ] ; Orthopnea [ ] ; Pedal Edema [ ] ; Palpitations [ ] ; Syncope [ ] ; Presyncope [ ] ; Paroxysmal nocturnal dyspnea[ ]    ?Pulmonary: Cough [ ] ; Wheezing[ ] ; Hemoptysis[ ] ; Sputum [ ] ; Snoring [ ]   ?GI: Vomiting[ ] ; Dysphagia[ ] ; Melena[ ] ; Hematochezia [ ] ; Heartburn[ ] ; Abdominal pain [ ] ; Constipation [ ] ; Diarrhea [ ] ; BRBPR [ ]   ?GU: Hematuria[ ] ; Dysuria [ ] ; Nocturia[ ]   ?Vascular: Pain in legs with walking [Y ]; Pain in feet with lying flat [ ] ; Non-healing sores [ ] ; Stroke [ ] ; TIA [ ] ; Slurred speech [ ] ;  ?Neuro: Headaches[ ] ; Vertigo[ ] ; Seizures[ ] ; Paresthesias[ ] ;Blurred vision [ ] ; Diplopia [ ] ; Vision changes [ ]   ?Ortho/Skin: Arthritis Jazmín.Cullens ]; Joint pain [ Y]; Muscle pain [ ] ; Joint swelling [ ] ; Back Pain [ Y]; Rash [ ]   ?Psych: Depression[ ] ; Anxiety[ ]   ?Heme: Bleeding problems [ ] ; Clotting disorders [ ] ; Anemia [ ]   ?Endocrine: Diabetes [ ] ; Thyroid dysfunction[ \Y] ?  ? ?Home Medications ?Prior to Admission medications   ?Medication Sig Start Date End Date Taking? Authorizing Provider  ?acetaminophen (TYLENOL) 500 MG tablet Take 500 mg by mouth every 6 (six) hours as needed (pain).   Yes [provider]  ?aspirin EC 81 MG tablet Take 81 mg by mouth in the morning.   Yes [provider]  ?cholecalciferol (VITAMIN D3) 25 MCG (1000 UT) tablet Take 1,000 Units  by mouth in the morning.   Yes [provider]  ?citalopram (CELEXA) 10 MG tablet Take 1 tablet by mouth once daily 03/19/21  Yes Charles Niese, Shaune Pascal, MD  ?Coenzyme Q10 (COQ10) 200 MG CAPS Take 200 mg by mouth in the morning.   Yes [provider]  ?famotidine (PEPCID) 20 MG tablet Take 20 mg by mouth in the morning.   Yes [provider]  ?furosemide (LASIX) 20 MG tablet Take 1 tablet (20 mg total) by mouth daily as needed (Take 20 mg as daily as needed for wt > 153 lbs). Reported on 01/14/2016 ?Patient not taking: Reported on 11/26/2021 02/21/21  Yes Tillery, Satira Mccallum, PA-C  ?melatonin 5 MG TABS Take 5 mg by mouth at bedtime as needed (sleep).   Yes [provider]  ?mexiletine (MEXITIL) 150 MG capsule  Take 1 capsule (150 mg total) by mouth 2 (two) times daily. 06/19/21  Yes Edit Ricciardelli, Shaune Pascal, MD  ?neomycin-bacitracin-polymyxin (NEOSPORIN) ointment Apply 1 application. topically as needed for wound care. ?Patient not taking: Reported on 11/26/2021   Yes [provider]  ?pantoprazole (PROTONIX) 40 MG tablet Take 1 tablet by mouth once daily 08/11/21  Yes Dakayla Disanti, Shaune Pascal, MD  ?potassium chloride SA (KLOR-CON) 20 MEQ tablet Take 1 tablet (20 mEq total) by mouth daily as needed. Take one potassium pill when you take your lasix. ?Patient not taking: Reported on 11/26/2021 02/21/21  Yes Shirley Friar, PA-C  ?Probiotic Product (PROBIOTIC PO) Take 1 capsule by mouth in the morning.   Yes [provider]  ?sotalol (BETAPACE) 80 MG tablet TAKE 1 TABLET (80 MG TOTAL) BY MOUTH DAILY. 07/22/21 07/22/22 Yes Dub Maclellan, Shaune Pascal, MD  ?SYNTHROID 175 MCG tablet 1 tablet daily every morning except half tablet on Sundays ?Patient taking differently: 1 tablet daily every morning 10/08/21  Yes Elayne Snare, MD  ?traMADol (ULTRAM) 50 MG tablet Take 1-2 tablets (50-100 mg total) by mouth every 6 (six) hours as needed for moderate pain. ?Patient taking differently: Take 50-100 mg by mouth every 6 (six) hours as needed for moderate pain (knee pain). 04/23/21  Yes Candice Tobey, Shaune Pascal, MD  ?traZODone (DESYREL) 50 MG tablet Take 1 tablet (50 mg total) by mouth at bedtime. ?Patient taking differently: Take 50 mg by mouth at bedtime as needed for sleep. 12/31/20  Yes Meleni Delahunt, Shaune Pascal, MD  ?rosuvastatin (CRESTOR) 20 MG tablet Take 1 tablet (20 mg total) by mouth daily. 11/26/21   Kaston Faughn, Shaune Pascal, MD  ?warfarin (COUMADIN) 5 MG tablet Take 5 mg (1 tablet) every day OR as directed by [facility] Clinic 11/26/21   San Lohmeyer, Shaune Pascal, MD  ? ? ?Past Medical History: ?Past Medical History:  ?Diagnosis Date  ? Anxiety   ? Automatic implantable cardioverter-defibrillator in situ   ? CAD (coronary artery disease)   ? S/P stenting of  LCX in 2004;  s/p Lat MI 2009 with occlusion of the LCX - treated with Promus stenting. Has total occlusion of the RCA.   ? CHF (congestive heart failure) ([place])   ? Hypercholesteremia   ? Hypertension   ? Hypothyroidism   ? ICD (implantable cardiac defibrillator) in place   ? PROPHYLACTIC      medtronic  ? Inferior MI ([place]) "? date"; 2009  ? Ischemic cardiomyopathy   ? a. 10/09 Echo: Sev LV Dysfxn, inf/lat AK, Mod MR.  ? Liver disease   ? LVAD (left ventricular assist device) present ([place])   ? Osteoarthritis   ?  a. s/p R TKR  ? Pacemaker   ? PVC (premature ventricular contraction)   ? RBBB   ? Shortness of breath   ? ? ?Past Surgical History: ?Past Surgical History:  ?Procedure Laterality Date  ? CARPAL TUNNEL RELEASE Right 05/29/2016  ? Procedure: CARPAL TUNNEL RELEASE;  Surgeon: Earlie Server, MD;  Location: [place];  Service: Orthopedics;  Laterality: Right;  ? CORONARY ANGIOPLASTY WITH STENT PLACEMENT  2004  ? CORONARY ANGIOPLASTY WITH STENT PLACEMENT  07/2008  ? DEFIBRILLATOR  2009  ? ESOPHAGOGASTRODUODENOSCOPY N/A 09/15/2013  ? Procedure: ESOPHAGOGASTRODUODENOSCOPY (EGD);  Surgeon: Ladene Artist, MD;  Location[facility] ENDOSCOPY;  Service: Endoscopy;  Laterality: N/A;  bedside  ? HEMATOMA EVACUATION Right 11/06/2013  ? Procedure: EVACUATION HEMATOMA;  Surgeon: Gaye Pollack, MD;  Location: [place];  Service: Cardiothoracic;  Laterality: Right;  ? ICD GENERATOR CHANGEOUT N/A 06/28/2019  ? Procedure: ICD GENERATOR CHANGEOUT;  Surgeon: Constance Haw, MD;  Location: [place] CV LAB;  Service: Cardiovascular;  Laterality: N/A;  ? INSERT / REPLACE / REMOVE PACEMAKER  2009  ? INSERTION OF IMPLANTABLE LEFT VENTRICULAR ASSIST DEVICE N/A 09/18/2013  ? Procedure: INSERTION OF IMPLANTABLE LEFT VENTRICULAR ASSIST DEVICE;  Surgeon: Ivin Poot, MD;  Location: [place];  Service: Open Heart Surgery;  Laterality: N/A;  CIRC ARREST ? ?NITRIC OXIDE  ? INTRA-AORTIC BALLOON PUMP INSERTION N/A 09/14/2013  ? Procedure: INTRA-AORTIC  BALLOON PUMP INSERTION;  Surgeon: Jolaine Artist, MD;  Location: [facility] CATH LAB;  Service: Cardiovascular;  Laterality: N/A;  ? INTRAOPERATIVE TRANSESOPHAGEAL ECHOCARDIOGRAM N/A 09/18/2013  ? Procedur

## 2021-12-01 NOTE — TOC Progression Note (Signed)
Transition of Care (TOC) - Progression Note  ? ? ?Patient Details  ?Name: Johnny Oneill ?MRN: [id number] ?Date of Birth: [birth year] ? ?Transition of Care (TOC) CM/SW Contact  ?Kamrin Spath Renold Don, LCSWA ?Phone Number: ?12/01/2021, 4:48 PM ? ?Clinical Narrative:    ?CSW spoke with pt and spouse in room to offer resources for meals on wheels, and mental health. Pt spouse declined, pt did not respond. Pt and spouse informed CSW that pt home helath was set up and he would have 6 sessions through the [place].CSW informed pt and spouse to follow up with CSW if they change their mind about any of the resources.  ? ? ?  ?  ? ?Expected Discharge Plan and Services ?  ?  ?  ?  ?  ?                ?DME Arranged: CPM ?DME Agency: [employer] ?  ?  ?  ?HH Arranged: PT ?[place] Agency: [place] ?  ?  ?  ? ? ?Social Determinants of Health (SDOH) Interventions ?  ? ?Readmission Risk Interventions ?   ? View : No data to display.  ?  ?  ?  ? ? ?

## 2021-12-02 ENCOUNTER — Ambulatory Visit: Payer: Self-pay | Admitting: Physician Assistant

## 2021-12-02 DIAGNOSIS — M1711 Unilateral primary osteoarthritis, right knee: Secondary | ICD-10-CM | POA: Diagnosis not present

## 2021-12-02 DIAGNOSIS — I5022 Chronic systolic (congestive) heart failure: Secondary | ICD-10-CM | POA: Diagnosis not present

## 2021-12-02 DIAGNOSIS — Z95811 Presence of heart assist device: Secondary | ICD-10-CM | POA: Diagnosis not present

## 2021-12-02 LAB — LACTATE DEHYDROGENASE: LDH: 282 U/L — ABNORMAL HIGH (ref 98–192)

## 2021-12-02 LAB — CBC
HCT: 32.2 % — ABNORMAL LOW (ref 39.0–52.0)
Hemoglobin: 10.5 g/dL — ABNORMAL LOW (ref 13.0–17.0)
MCH: 30.1 pg (ref 26.0–34.0)
MCHC: 32.6 g/dL (ref 30.0–36.0)
MCV: 92.3 fL (ref 80.0–100.0)
Platelets: 155 10*3/uL (ref 150–400)
RBC: 3.49 MIL/uL — ABNORMAL LOW (ref 4.22–5.81)
RDW: 14.8 % (ref 11.5–15.5)
WBC: 5.2 10*3/uL (ref 4.0–10.5)
nRBC: 0 % (ref 0.0–0.2)

## 2021-12-02 LAB — HEPARIN LEVEL (UNFRACTIONATED)
Heparin Unfractionated: 0.15 IU/mL — ABNORMAL LOW (ref 0.30–0.70)
Heparin Unfractionated: 0.29 IU/mL — ABNORMAL LOW (ref 0.30–0.70)
Heparin Unfractionated: 0.39 IU/mL (ref 0.30–0.70)

## 2021-12-02 LAB — BASIC METABOLIC PANEL
Anion gap: 8 (ref 5–15)
BUN: 39 mg/dL — ABNORMAL HIGH (ref 8–23)
CO2: 22 mmol/L (ref 22–32)
Calcium: 9.2 mg/dL (ref 8.9–10.3)
Chloride: 109 mmol/L (ref 98–111)
Creatinine, Ser: 2.48 mg/dL — ABNORMAL HIGH (ref 0.61–1.24)
GFR, Estimated: 26 mL/min — ABNORMAL LOW (ref >60)
Glucose, Bld: 99 mg/dL (ref 70–99)
Potassium: 4.4 mmol/L (ref 3.5–5.1)
Sodium: 139 mmol/L (ref 135–145)

## 2021-12-02 LAB — PROTIME-INR
INR: 1.3 — ABNORMAL HIGH (ref 0.8–1.2)
Prothrombin Time: 16.6 seconds — ABNORMAL HIGH (ref 11.4–15.2)

## 2021-12-02 MED ORDER — CEFAZOLIN SODIUM-DEXTROSE 2-4 GM/100ML-% IV SOLN
2.0000 g | INTRAVENOUS | Status: AC
  Administered 2021-12-03: 2 g via INTRAVENOUS
  Filled 2021-12-02: qty 100

## 2021-12-02 MED ORDER — BUPIVACAINE LIPOSOME 1.3 % IJ SUSP: 20.0000 mL | Freq: Once | INTRAMUSCULAR | Status: DC

## 2021-12-02 MED ORDER — ENSURE PRE-SURGERY PO LIQD
296.0000 mL | Freq: Once | ORAL | Status: AC
  Administered 2021-12-02: 296 mL via ORAL
  Filled 2021-12-02: qty 296

## 2021-12-02 MED ORDER — LACTATED RINGERS IV SOLN: INTRAVENOUS | Status: AC

## 2021-12-02 MED ORDER — ACETAMINOPHEN 500 MG PO TABS
1000.0000 mg | ORAL_TABLET | Freq: Once | ORAL | Status: AC
  Administered 2021-12-03: 1000 mg via ORAL
  Filled 2021-12-02: qty 2

## 2021-12-02 NOTE — Plan of Care (Signed)
?  Problem: Education: ?Goal: Knowledge of General Education information will improve ?Description: Including pain rating scale, medication(s)/side effects and non-pharmacologic comfort measures ?Outcome: Progressing ?  ?Problem: Health Behavior/Discharge Planning: ?Goal: Ability to manage health-related needs will improve ?Outcome: Progressing ?  ?Problem: Clinical Measurements: ?Goal: Ability to maintain clinical measurements within normal limits will improve ?Outcome: Progressing ?Goal: Will remain free from infection ?Outcome: Progressing ?  ?Problem: Nutrition: ?Goal: Adequate nutrition will be maintained ?Outcome: Progressing ?  ?Problem: Coping: ?Goal: Level of anxiety will decrease ?Outcome: Progressing ?  ?Problem: Elimination: ?Goal: Will not experience complications related to bowel motility ?Outcome: Progressing ?Goal: Will not experience complications related to urinary retention ?Outcome: Progressing ?  ?Problem: Safety: ?Goal: Ability to remain free from injury will improve ?Outcome: Progressing ?  ?Problem: Skin Integrity: ?Goal: Risk for impaired skin integrity will decrease ?Outcome: Progressing ?  ?

## 2021-12-02 NOTE — H&P (Signed)
TOTAL KNEE ADMISSION H&P ? ?Patient is being admitted for right total knee arthroplasty. ? ?Subjective: ? ?Chief Complaint:right knee pain. ? ?HPI: Johnny Oneill, 81 y.o. male, has a history of pain and functional disability in the right knee due to arthritis and has failed non-surgical conservative treatments for greater than 12 weeks to includecorticosteriod injections, viscosupplementation injections, and activity modification.  Onset of symptoms was gradual, starting >10 years ago with gradually worsening course since that time. The patient noted no past surgery on the right knee(s).  Patient currently rates pain in the right knee(s) at 8 out of 10 with activity. Patient has night pain, worsening of pain with activity and weight bearing, pain that interferes with activities of daily living, pain with passive range of motion, crepitus, and joint swelling.  Patient has evidence of periarticular osteophytes and joint space narrowing by imaging studies. There is no active infection. ? ?Patient Active Problem List  ? Diagnosis Date Noted  ? CKD (chronic kidney disease), stage III ([place]) 06/16/2021  ? Chronic systolic CHF (congestive heart failure) ([place]) 01/02/2015  ? Decreased appetite 11/01/2014  ? Contact dermatitis 09/11/2014  ? Inguinal hernia 08/21/2014  ? Hemothorax 02/08/2014  ? HTN (hypertension) 01/30/2014  ? Chronic anticoagulation 01/08/2014  ? Hypothyroid 12/20/2013  ? Syncope 11/28/2013  ? Hemothorax on right 11/06/2013  ? Poor venous access 11/02/2013  ? LVAD (left ventricular assist device) present ([place]) 09/18/2013  ? Weakness generalized 09/15/2013  ? Nonspecific (abnormal) findings on radiological and other examination of gastrointestinal tract 09/14/2013  ? Anemia, unspecified 09/14/2013  ? 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 PVC (premature ventricular contraction) 12/08/2012  ? VT (ventricular tachycardia) 12/08/2012  ? Essential hypertension, benign 11/12/2010  ? ISCHEMIC CARDIOMYOPATHY  11/12/2010  ? Chronic systolic heart failure ([place]) 11/12/2010  ? Ischemic cardiomyopathy   ? Myocardial infarction of lateral wall (HCC)   ? Hypercholesteremia   ? Automatic implantable cardioverter-defibrillator in situ   ? CAD (coronary artery disease)   ? Osteoarthritis   ? ?Past Medical History:  ?Diagnosis Date  ? Anxiety   ? Automatic implantable cardioverter-defibrillator in situ   ? CAD (coronary artery disease)   ? S/P stenting of LCX in 2004;  s/p Lat MI 2009 with occlusion of the LCX - treated with Promus stenting. Has total occlusion of the RCA.   ? CHF (congestive heart failure) ([place])   ? Hypercholesteremia   ? Hypertension   ? Hypothyroidism   ? ICD (implantable cardiac defibrillator) in place   ? PROPHYLACTIC      medtronic  ? Inferior MI ([place]) "? date"; 2009  ? Ischemic cardiomyopathy   ? a. 10/09 Echo: Sev LV Dysfxn, inf/lat AK, Mod MR.  ? Liver disease   ? LVAD (left ventricular assist device) present ([place])   ? Osteoarthritis   ? a. s/p R TKR  ? Pacemaker   ? PVC (premature ventricular contraction)   ? RBBB   ? Shortness of breath   ?  ?Past Surgical History:  ?Procedure Laterality Date  ? CARPAL TUNNEL RELEASE Right 05/29/2016  ? Procedure: CARPAL TUNNEL RELEASE;  Surgeon: Earlie Server, MD;  Location: [place];  Service: Orthopedics;  Laterality: Right;  ? CORONARY ANGIOPLASTY WITH STENT PLACEMENT  2004  ? CORONARY ANGIOPLASTY WITH STENT PLACEMENT  07/2008  ? DEFIBRILLATOR  2009  ? ESOPHAGOGASTRODUODENOSCOPY N/A 09/15/2013  ? Procedure: ESOPHAGOGASTRODUODENOSCOPY (EGD);  Surgeon: Ladene Artist, MD;  Location[facility] ENDOSCOPY;  Service: Endoscopy;  Laterality: N/A;  bedside  ?  HEMATOMA EVACUATION Right 11/06/2013  ? Procedure: EVACUATION HEMATOMA;  Surgeon: Gaye Pollack, MD;  Location: [place];  Service: Cardiothoracic;  Laterality: Right;  ? ICD GENERATOR CHANGEOUT N/A 06/28/2019  ? Procedure: ICD GENERATOR CHANGEOUT;  Surgeon: Constance Haw, MD;  Location: [place] CV LAB;  Service:  Cardiovascular;  Laterality: N/A;  ? INSERT / REPLACE / REMOVE PACEMAKER  2009  ? INSERTION OF IMPLANTABLE LEFT VENTRICULAR ASSIST DEVICE N/A 09/18/2013  ? Procedure: INSERTION OF IMPLANTABLE LEFT VENTRICULAR ASSIST DEVICE;  Surgeon: Ivin Poot, MD;  Location: [place];  Service: Open Heart Surgery;  Laterality: N/A;  CIRC ARREST ? ?NITRIC OXIDE  ? INTRA-AORTIC BALLOON PUMP INSERTION N/A 09/14/2013  ? Procedure: INTRA-AORTIC BALLOON PUMP INSERTION;  Surgeon: Jolaine Artist, MD;  Location: [facility] CATH LAB;  Service: Cardiovascular;  Laterality: N/A;  ? INTRAOPERATIVE TRANSESOPHAGEAL ECHOCARDIOGRAM N/A 09/18/2013  ? Procedure: INTRAOPERATIVE TRANSESOPHAGEAL ECHOCARDIOGRAM;  Surgeon: Ivin Poot, MD;  Location: [place];  Service: Open Heart Surgery;  Laterality: N/A;  ? KNEE ARTHROSCOPY    ? bilaterally  ? KNEE ARTHROSCOPY W/ PARTIAL MEDIAL MENISCECTOMY  12/2002  ? left  ? LEFT VENTRICULAR ASSIST DEVICE    ? RIGHT HEART CATH N/A 07/28/2021  ? Procedure: RIGHT HEART CATH;  Surgeon: Jolaine Artist, MD;  Location: [place] CV LAB;  Service: Cardiovascular;  Laterality: N/A;  ? RIGHT HEART CATHETERIZATION N/A 08/25/2013  ? Procedure: RIGHT HEART CATH;  Surgeon: Jolaine Artist, MD;  Location: [facility] CATH LAB;  Service: Cardiovascular;  Laterality: N/A;  ? RIGHT HEART CATHETERIZATION N/A 09/13/2013  ? Procedure: RIGHT HEART CATH;  Surgeon: Jolaine Artist, MD;  Location: [facility] CATH LAB;  Service: Cardiovascular;  Laterality: N/A;  ? RIGHT HEART CATHETERIZATION N/A 02/08/2014  ? Procedure: RIGHT HEART CATH;  Surgeon: Jolaine Artist, MD;  Location: [facility] CATH LAB;  Service: Cardiovascular;  Laterality: N/A;  ? RIGHT HEART CATHETERIZATION N/A 02/12/2014  ? Procedure: RIGHT HEART CATH;  Surgeon: Jolaine Artist, MD;  Location: [facility] CATH LAB;  Service: Cardiovascular;  Laterality: N/A;  ? TOTAL KNEE ARTHROPLASTY  11/27/11  ? left  ? TOTAL KNEE ARTHROPLASTY  11/27/2011  ? Procedure: TOTAL KNEE ARTHROPLASTY;  Surgeon: Yvette Rack., MD;  Location: [place];  Service: Orthopedics;  Laterality: Left;  left total knee arthroplasty  ? VIDEO ASSISTED THORACOSCOPY Right 11/06/2013  ? Procedure: VIDEO ASSISTED THORACOSCOPY;  Surgeon: Gaye Pollack, MD;  Location: [place];  Service: Cardiothoracic;  Laterality: Right;  ?  ?No current facility-administered medications for this visit.  ? ?No current outpatient medications on file.  ? ?Facility-Administered Medications Ordered in Other Visits  ?Medication Dose Route Frequency Provider Last Rate Last Admin  ? 0.9 %  sodium chloride infusion  250 mL Intravenous PRN Clegg, Amy D, NP      ? acetaminophen (TYLENOL) tablet 650 mg  650 mg Oral Q4H PRN Clegg, Amy D, NP      ? aspirin EC tablet 81 mg  81 mg Oral q AM Joette Catching, PA-C   81 mg at 12/02/21 0813  ? citalopram (CELEXA) tablet 10 mg  10 mg Oral Daily Joette Catching, [place]   10 mg at 12/02/21 1047  ? etomidate (AMIDATE) injection    Anesthesia Intra-op Myna Bright, CRNA   20 mg at 02/08/14 0915  ? heparin ADULT infusion 100 units/mL (25000 units/246mL)  1,050 Units/hr Intravenous Continuous Einar Grad, RPH 10.5 mL/hr at 12/02/21 2141  1,050 Units/hr at 12/02/21 2141  ? [START ON 12/03/2021] lactated ringers infusion   Intravenous Continuous Clegg, Amy D, NP      ? levothyroxine (SYNTHROID) tablet 175 mcg  175 mcg Oral Q0600 Joette Catching, [place]   175 mcg at 12/02/21 8377  ? magnesium hydroxide (MILK OF MAGNESIA) suspension 30 mL  30 mL Oral Daily PRN Clegg, Amy D, NP      ? melatonin tablet 5 mg  5 mg Oral QHS PRN Clegg, Amy D, NP   5 mg at 12/02/21 2138  ? mexiletine (MEXITIL) capsule 150 mg  150 mg Oral BID Joette Catching, [place]   150 mg at 12/02/21 2137  ? ondansetron (ZOFRAN) injection 4 mg  4 mg Intravenous Q6H PRN Clegg, Amy D, NP      ? pantoprazole (PROTONIX) EC tablet 40 mg  40 mg Oral Daily Joette Catching, [place]   40 mg at 12/02/21 1048  ? rocuronium [facility]) injection    Anesthesia  Intra-op Myna Bright, CRNA   50 mg at 02/08/14 0932  ? rosuvastatin (CRESTOR) tablet 20 mg  20 mg Oral QPM Joette Catching, [place]   20 mg at 12/02/21 1828  ? sodium chloride flush (NS) 0.9 % injection 3 mL  3 mL

## 2021-12-02 NOTE — Progress Notes (Signed)
Mobility Specialist: Progress Note ? ? 12/02/21 1108  ?Mobility  ?Activity Ambulated with assistance in hallway  ?Level of Assistance Modified independent, requires aide device or extra time  ?Assistive Device Other (Comment) ?(IV pole)  ?Distance Ambulated (ft) 400 ft  ?Activity Response Tolerated well  ?$Mobility charge 1 Mobility  ? ?Pt received in bed and agreeable to ambulation. C/o R knee discomfort during session, otherwise asymptomatic. Pt sitting EOB after walk with call bell and phone at his side. Pt requesting to remain on batteries for now, RN notified.  ? ?Johnny Oneill ?Mobility Specialist ?Mobility Specialist [place]: [phone] ?Mobility Specialist [place]: [phone] ? ?

## 2021-12-02 NOTE — Progress Notes (Signed)
ANTICOAGULATION CONSULT NOTE ? ?Pharmacy Consult for heparin ?Indication: LVAD ? ?Allergies  ?Allergen Reactions  ? Ace Inhibitors   ?  Hypotension and elevated creatinine  ? Amiodarone Other (See Comments)  ?  Cerebellar side effects  ? Diovan [Valsartan] Other (See Comments)  ?  Hypotension at low dose, hyperkalemia  ? Lipitor [Atorvastatin Calcium] Other (See Comments)  ?  Muscle pain  ? Jardiance [Empagliflozin] Other (See Comments)  ?  Unsure of exact side effect  ? Norvasc [Amlodipine] Other (See Comments)  ?  Put patient to sleep  ? ? ?Patient Measurements: ?Height: 5\' 7"  (170.2 cm) ?Weight: 71.4 kg (157 lb 6.5 oz) ?IBW/kg (Calculated) : 66.1 ? Heparin dosing weight: 71.1 kg ? ?Vital Signs: ?Temp: 97.8 ?F (36.6 ?C) (04/04 1637) ?Temp Source: Oral (04/04 1637) ?BP: 108/95 (04/04 1637) ?Pulse Rate: 79 (04/04 1637) ? ?Labs: ?Recent Labs  ?  12/01/21 ?1646 12/02/21 ?1660 12/02/21 ?6301 12/02/21 ?1024 12/02/21 ?1640  ?HGB 10.2* 10.5*  --   --   --   ?HCT 30.9* 32.2*  --   --   --   ?PLT 170 155  --   --   --   ?LABPROT 17.1* 16.6*  --   --   --   ?INR 1.4* 1.3*  --   --   --   ?HEPARINUNFRC  --   --  0.15* 0.29* 0.39  ?CREATININE 2.49* 2.48*  --   --   --   ? ? ? ?Estimated Creatinine Clearance: 23 mL/min (A) (by C-G formula based on SCr of 2.48 mg/dL (H)). ? ? ?Medical History: ?Past Medical History:  ?Diagnosis Date  ? Anxiety   ? Automatic implantable cardioverter-defibrillator in situ   ? CAD (coronary artery disease)   ? S/P stenting of LCX in 2004;  s/p Lat MI 2009 with occlusion of the LCX - treated with Promus stenting. Has total occlusion of the RCA.   ? CHF (congestive heart failure) ([place])   ? Hypercholesteremia   ? Hypertension   ? Hypothyroidism   ? ICD (implantable cardiac defibrillator) in place   ? PROPHYLACTIC      medtronic  ? Inferior MI ([place]) "? date"; 2009  ? Ischemic cardiomyopathy   ? a. 10/09 Echo: Sev LV Dysfxn, inf/lat AK, Mod MR.  ? Liver disease   ? LVAD (left ventricular assist device)  present ([place])   ? Osteoarthritis   ? a. s/p R TKR  ? Pacemaker   ? PVC (premature ventricular contraction)   ? RBBB   ? Shortness of breath   ? ? ?Medications:  ?Medications Prior to Admission  ?Medication Sig Dispense Refill Last Dose  ? acetaminophen (TYLENOL) 500 MG tablet Take 500 mg by mouth every 6 (six) hours as needed (pain).     ? aspirin EC 81 MG tablet Take 81 mg by mouth in the morning.     ? cholecalciferol (VITAMIN D3) 25 MCG (1000 UT) tablet Take 1,000 Units by mouth in the morning.     ? citalopram (CELEXA) 10 MG tablet Take 1 tablet by mouth once daily 90 tablet 3   ? Coenzyme Q10 (COQ10) 200 MG CAPS Take 200 mg by mouth in the morning.     ? famotidine (PEPCID) 20 MG tablet Take 20 mg by mouth in the morning.     ? furosemide (LASIX) 20 MG tablet Take 1 tablet (20 mg total) by mouth daily as needed (Take 20 mg as daily as needed for  wt > 153 lbs). Reported on 01/14/2016 (Patient not taking: Reported on 11/26/2021) 30 tablet 6   ? melatonin 5 MG TABS Take 5 mg by mouth at bedtime as needed (sleep).     ? mexiletine (MEXITIL) 150 MG capsule Take 1 capsule (150 mg total) by mouth 2 (two) times daily. 60 capsule 6   ? neomycin-bacitracin-polymyxin (NEOSPORIN) ointment Apply 1 application. topically as needed for wound care. (Patient not taking: Reported on 11/26/2021)     ? pantoprazole (PROTONIX) 40 MG tablet Take 1 tablet by mouth once daily 90 tablet 3   ? potassium chloride SA (KLOR-CON) 20 MEQ tablet Take 1 tablet (20 mEq total) by mouth daily as needed. Take one potassium pill when you take your lasix. (Patient not taking: Reported on 11/26/2021) 30 tablet 6   ? Probiotic Product (PROBIOTIC PO) Take 1 capsule by mouth in the morning.     ? sotalol (BETAPACE) 80 MG tablet TAKE 1 TABLET (80 MG TOTAL) BY MOUTH DAILY. 90 tablet 3   ? SYNTHROID 175 MCG tablet 1 tablet daily every morning except half tablet on Sundays (Patient taking differently: 1 tablet daily every morning) 30 tablet 3   ? traMADol  (ULTRAM) 50 MG tablet Take 1-2 tablets (50-100 mg total) by mouth every 6 (six) hours as needed for moderate pain. (Patient taking differently: Take 50-100 mg by mouth every 6 (six) hours as needed for moderate pain (knee pain).) 45 tablet 5   ? traZODone (DESYREL) 50 MG tablet Take 1 tablet (50 mg total) by mouth at bedtime. (Patient taking differently: Take 50 mg by mouth at bedtime as needed for sleep.) 30 tablet 5   ? rosuvastatin (CRESTOR) 20 MG tablet Take 1 tablet (20 mg total) by mouth daily. 90 tablet 3   ? warfarin (COUMADIN) 5 MG tablet Take 5 mg (1 tablet) every day OR as directed by [facility] 135 tablet 11   ? ?Scheduled:  ? aspirin EC  81 mg Oral q AM  ? citalopram  10 mg Oral Daily  ? levothyroxine  175 mcg Oral Q0600  ? mexiletine  150 mg Oral BID  ? pantoprazole  40 mg Oral Daily  ? rosuvastatin  20 mg Oral QPM  ? sodium chloride flush  3 mL Intravenous Q12H  ? sotalol  80 mg Oral Daily  ? ? ?Assessment: ?81 yo male on warfarin PTA for LVAD (placed 08/2013). Plans are for R TKR on 4/5 (last dose of warfarin given 3/30). INR 1.3 today. ?Pharmacy consulted to dose heparin while INR < 2 ? ?Heparin level  at goal at 0.39 on heparin drip 1050 uts/hr  CBC stable, LDH up slightly 220>280. ? ? ?Goal of Therapy:  ?Heparin level 0.3-0.5 ?Monitor platelets by anticoagulation protocol: Yes ?  ?Plan:  ?Continue  heparin infusion 1050 units/hr ?Daily heparin level and CBC ?Follow up plans post op  ? ? ?Bonnita Nasuti Pharm.D. CPP, BCPS ?Clinical Pharmacist ?[phone] ?12/02/2021 6:37 PM  ? ?Please check AMION for all [place] numbers ?12/02/2021 ? ? ? ? ? ?

## 2021-12-02 NOTE — H&P (View-Only) (Signed)
TOTAL KNEE ADMISSION H&P ? ?Patient is being admitted for right total knee arthroplasty. ? ?Subjective: ? ?Chief Complaint:right knee pain. ? ?HPI: Johnny Oneill, 81 y.o. male, has a history of pain and functional disability in the right knee due to arthritis and has failed non-surgical conservative treatments for greater than 12 weeks to includecorticosteriod injections, viscosupplementation injections, and activity modification.  Onset of symptoms was gradual, starting >10 years ago with gradually worsening course since that time. The patient noted no past surgery on the right knee(s).  Patient currently rates pain in the right knee(s) at 8 out of 10 with activity. Patient has night pain, worsening of pain with activity and weight bearing, pain that interferes with activities of daily living, pain with passive range of motion, crepitus, and joint swelling.  Patient has evidence of periarticular osteophytes and joint space narrowing by imaging studies. There is no active infection. ? ?Patient Active Problem List  ? Diagnosis Date Noted  ? CKD (chronic kidney disease), stage III ([place]) 06/16/2021  ? Chronic systolic CHF (congestive heart failure) ([place]) 01/02/2015  ? Decreased appetite 11/01/2014  ? Contact dermatitis 09/11/2014  ? Inguinal hernia 08/21/2014  ? Hemothorax 02/08/2014  ? HTN (hypertension) 01/30/2014  ? Chronic anticoagulation 01/08/2014  ? Hypothyroid 12/20/2013  ? Syncope 11/28/2013  ? Hemothorax on right 11/06/2013  ? Poor venous access 11/02/2013  ? LVAD (left ventricular assist device) present ([place]) 09/18/2013  ? Weakness generalized 09/15/2013  ? Nonspecific (abnormal) findings on radiological and other examination of gastrointestinal tract 09/14/2013  ? Anemia, unspecified 09/14/2013  ? 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 PVC (premature ventricular contraction) 12/08/2012  ? VT (ventricular tachycardia) 12/08/2012  ? Essential hypertension, benign 11/12/2010  ? ISCHEMIC CARDIOMYOPATHY  11/12/2010  ? Chronic systolic heart failure ([place]) 11/12/2010  ? Ischemic cardiomyopathy   ? Myocardial infarction of lateral wall ([facility])   ? Hypercholesteremia   ? Automatic implantable cardioverter-defibrillator in situ   ? CAD (coronary artery disease)   ? Osteoarthritis   ? ?Past Medical History:  ?Diagnosis Date  ? Anxiety   ? Automatic implantable cardioverter-defibrillator in situ   ? CAD (coronary artery disease)   ? S/P stenting of LCX in 2004;  s/p Lat MI 2009 with occlusion of the LCX - treated with Promus stenting. Has total occlusion of the RCA.   ? CHF (congestive heart failure) ([place])   ? Hypercholesteremia   ? Hypertension   ? Hypothyroidism   ? ICD (implantable cardiac defibrillator) in place   ? PROPHYLACTIC      medtronic  ? Inferior MI ([place]) "? date"; 2009  ? Ischemic cardiomyopathy   ? a. 10/09 Echo: Sev LV Dysfxn, inf/lat AK, Mod MR.  ? Liver disease   ? LVAD (left ventricular assist device) present ([place])   ? Osteoarthritis   ? a. s/p R TKR  ? Pacemaker   ? PVC (premature ventricular contraction)   ? RBBB   ? Shortness of breath   ?  ?Past Surgical History:  ?Procedure Laterality Date  ? CARPAL TUNNEL RELEASE Right 05/29/2016  ? Procedure: CARPAL TUNNEL RELEASE;  Surgeon: Earlie Server, MD;  Location: [place];  Service: Orthopedics;  Laterality: Right;  ? CORONARY ANGIOPLASTY WITH STENT PLACEMENT  2004  ? CORONARY ANGIOPLASTY WITH STENT PLACEMENT  07/2008  ? DEFIBRILLATOR  2009  ? ESOPHAGOGASTRODUODENOSCOPY N/A 09/15/2013  ? Procedure: ESOPHAGOGASTRODUODENOSCOPY (EGD);  Surgeon: Ladene Artist, MD;  Location: [facility] ENDOSCOPY;  Service: Endoscopy;  Laterality: N/A;  bedside  ?  HEMATOMA EVACUATION Right 11/06/2013  ? Procedure: EVACUATION HEMATOMA;  Surgeon: Gaye Pollack, MD;  Location: [place];  Service: Cardiothoracic;  Laterality: Right;  ? ICD GENERATOR CHANGEOUT N/A 06/28/2019  ? Procedure: ICD GENERATOR CHANGEOUT;  Surgeon: Constance Haw, MD;  Location: [place] CV LAB;  Service:  Cardiovascular;  Laterality: N/A;  ? INSERT / REPLACE / REMOVE PACEMAKER  2009  ? INSERTION OF IMPLANTABLE LEFT VENTRICULAR ASSIST DEVICE N/A 09/18/2013  ? Procedure: INSERTION OF IMPLANTABLE LEFT VENTRICULAR ASSIST DEVICE;  Surgeon: Ivin Poot, MD;  Location: [place];  Service: Open Heart Surgery;  Laterality: N/A;  CIRC ARREST ? ?NITRIC OXIDE  ? INTRA-AORTIC BALLOON PUMP INSERTION N/A 09/14/2013  ? Procedure: INTRA-AORTIC BALLOON PUMP INSERTION;  Surgeon: Jolaine Artist, MD;  Location: [facility] CATH LAB;  Service: Cardiovascular;  Laterality: N/A;  ? INTRAOPERATIVE TRANSESOPHAGEAL ECHOCARDIOGRAM N/A 09/18/2013  ? Procedure: INTRAOPERATIVE TRANSESOPHAGEAL ECHOCARDIOGRAM;  Surgeon: Ivin Poot, MD;  Location: [place];  Service: Open Heart Surgery;  Laterality: N/A;  ? KNEE ARTHROSCOPY    ? bilaterally  ? KNEE ARTHROSCOPY W/ PARTIAL MEDIAL MENISCECTOMY  12/2002  ? left  ? LEFT VENTRICULAR ASSIST DEVICE    ? RIGHT HEART CATH N/A 07/28/2021  ? Procedure: RIGHT HEART CATH;  Surgeon: Jolaine Artist, MD;  Location: [place] CV LAB;  Service: Cardiovascular;  Laterality: N/A;  ? RIGHT HEART CATHETERIZATION N/A 08/25/2013  ? Procedure: RIGHT HEART CATH;  Surgeon: Jolaine Artist, MD;  Location: [facility] CATH LAB;  Service: Cardiovascular;  Laterality: N/A;  ? RIGHT HEART CATHETERIZATION N/A 09/13/2013  ? Procedure: RIGHT HEART CATH;  Surgeon: Jolaine Artist, MD;  Location: [facility] CATH LAB;  Service: Cardiovascular;  Laterality: N/A;  ? RIGHT HEART CATHETERIZATION N/A 02/08/2014  ? Procedure: RIGHT HEART CATH;  Surgeon: Jolaine Artist, MD;  Location: [facility] CATH LAB;  Service: Cardiovascular;  Laterality: N/A;  ? RIGHT HEART CATHETERIZATION N/A 02/12/2014  ? Procedure: RIGHT HEART CATH;  Surgeon: Jolaine Artist, MD;  Location: [facility] CATH LAB;  Service: Cardiovascular;  Laterality: N/A;  ? TOTAL KNEE ARTHROPLASTY  11/27/11  ? left  ? TOTAL KNEE ARTHROPLASTY  11/27/2011  ? Procedure: TOTAL KNEE ARTHROPLASTY;  Surgeon: Yvette Rack., MD;  Location: [place];  Service: Orthopedics;  Laterality: Left;  left total knee arthroplasty  ? VIDEO ASSISTED THORACOSCOPY Right 11/06/2013  ? Procedure: VIDEO ASSISTED THORACOSCOPY;  Surgeon: Gaye Pollack, MD;  Location: [place];  Service: Cardiothoracic;  Laterality: Right;  ?  ?No current facility-administered medications for this visit.  ? ?No current outpatient medications on file.  ? ?Facility-Administered Medications Ordered in Other Visits  ?Medication Dose Route Frequency Provider Last Rate Last Admin  ? 0.9 %  sodium chloride infusion  250 mL Intravenous PRN Clegg, Amy D, NP      ? acetaminophen (TYLENOL) tablet 650 mg  650 mg Oral Q4H PRN Clegg, Amy D, NP      ? aspirin EC tablet 81 mg  81 mg Oral q AM Joette Catching, PA-C   81 mg at 12/02/21 0813  ? citalopram (CELEXA) tablet 10 mg  10 mg Oral Daily Joette Catching, [place]   10 mg at 12/02/21 1047  ? etomidate (AMIDATE) injection    Anesthesia Intra-op Myna Bright, CRNA   20 mg at 02/08/14 0915  ? heparin ADULT infusion 100 units/mL (25000 units/237mL)  1,050 Units/hr Intravenous Continuous Einar Grad, RPH 10.5 mL/hr at 12/02/21 2141  1,050 Units/hr at 12/02/21 2141  ? [START ON 12/03/2021] lactated ringers infusion   Intravenous Continuous Clegg, Amy D, NP      ? levothyroxine (SYNTHROID) tablet 175 mcg  175 mcg Oral Q0600 Joette Catching, [place]   175 mcg at 12/02/21 8984  ? magnesium hydroxide (MILK OF MAGNESIA) suspension 30 mL  30 mL Oral Daily PRN Clegg, Amy D, NP      ? melatonin tablet 5 mg  5 mg Oral QHS PRN Clegg, Amy D, NP   5 mg at 12/02/21 2138  ? mexiletine (MEXITIL) capsule 150 mg  150 mg Oral BID Joette Catching, [place]   150 mg at 12/02/21 2137  ? ondansetron (ZOFRAN) injection 4 mg  4 mg Intravenous Q6H PRN Clegg, Amy D, NP      ? pantoprazole (PROTONIX) EC tablet 40 mg  40 mg Oral Daily Joette Catching, [place]   40 mg at 12/02/21 1048  ? rocuronium [facility]) injection    Anesthesia  Intra-op Myna Bright, CRNA   50 mg at 02/08/14 0932  ? rosuvastatin (CRESTOR) tablet 20 mg  20 mg Oral QPM Joette Catching, [place]   20 mg at 12/02/21 1828  ? sodium chloride flush (NS) 0.9 % injection 3 mL  3 mL

## 2021-12-02 NOTE — Progress Notes (Addendum)
?Advanced Heart Failure VAD Team Note ? ?PCP-Cardiologist: None  ? ?Subjective:   ?Admit for heparin bridge prior to R TKR  ? ?INR 1.3. On heparin drip.  ? ? ?LVAD INTERROGATION:  ?HeartMate II LVAD:   ?Flow 3.6 liters/min, speed 9600, power 5 PI 5.1.   ? ?Objective:   ? ?Vital Signs:   ?Temp:  [97.6 ?F (36.4 ?C)-98 ?F (36.7 ?C)] 97.6 ?F (36.4 ?C) (04/04 4098) ?Pulse Rate:  [64-80] 74 (04/04 0811) ?Resp:  [15-19] 19 (04/04 1191) ?BP: (98-117)/(85-100) 108/92 (04/04 4782) ?SpO2:  [97 %] 97 % (04/04 0811) ?Weight:  [71.1 kg-71.4 kg] 71.4 kg (04/04 0619) ?  ?Mean arterial Pressure 80s  ? ?Intake/Output:  ? ?Intake/Output Summary (Last 24 hours) at 12/02/2021 0841 ?Last data filed at 12/02/2021 9562 ?Gross per 24 hour  ?Intake 429.36 ml  ?Output 1050 ml  ?Net -620.64 ml  ?  ? ?Physical Exam  ?  ?General:  Well appearing. No resp difficulty ?HEENT: normal ?Neck: supple. JVP flat . Carotids 2+ bilat; no bruits. No lymphadenopathy or thyromegaly appreciated. ?Cor: Mechanical heart sounds with LVAD hum present. ?Lungs: clear ?Abdomen: soft, nontender, nondistended. No hepatosplenomegaly. No bruits or masses. Good bowel sounds. ?Driveline: C/D/I; securement device intact and driveline incorporated ?Extremities: no cyanosis, clubbing, rash, edema ?Neuro: alert & orientedx3, cranial nerves grossly intact. moves all 4 extremities w/o difficulty. Affect pleasant ? ? ?Telemetry  ? ?A V paced 70-80s with occasional PVCs.  ?EKG  ?  ?N/A ?Labs  ? ?Basic Metabolic Panel: ?Recent Labs  ?Lab 12/01/21 ?1646 12/02/21 ?1308  ?NA 136 139  ?K 5.1 4.4  ?CL 107 109  ?CO2 23 22  ?GLUCOSE 106* 99  ?BUN 41* 39*  ?CREATININE 2.49* 2.48*  ?CALCIUM 8.9 9.2  ?MG 1.9  --   ? ? ?Liver Function Tests: ?No results for input(s): AST, ALT, ALKPHOS, BILITOT, PROT, ALBUMIN in the last 168 hours. ?No results for input(s): LIPASE, AMYLASE in the last 168 hours. ?No results for input(s): AMMONIA in the last 168 hours. ? ?CBC: ?Recent Labs  ?Lab 12/01/21 ?1646  12/02/21 ?6578  ?WBC 5.5 5.2  ?HGB 10.2* 10.5*  ?HCT 30.9* 32.2*  ?MCV 91.4 92.3  ?PLT 170 155  ? ? ?INR: ?Recent Labs  ?Lab 11/26/21 ?1115 12/01/21 ?1646 12/02/21 ?4696  ?INR 2.7* 1.4* 1.3*  ? ? ?Other results: ?EKG:  ? ? ?Imaging  ? ?No results found. ? ? ?Medications:   ? ? ?Scheduled Medications: ? aspirin EC  81 mg Oral q AM  ? citalopram  10 mg Oral Daily  ? levothyroxine  175 mcg Oral Q0600  ? mexiletine  150 mg Oral BID  ? pantoprazole  40 mg Oral Daily  ? rosuvastatin  20 mg Oral QPM  ? sodium chloride flush  3 mL Intravenous Q12H  ? sotalol  80 mg Oral Daily  ? ? ?Infusions: ? sodium chloride    ? heparin 950 Units/hr (12/02/21 0300)  ? ? ?PRN Medications: ?sodium chloride, acetaminophen, magnesium hydroxide, melatonin, ondansetron (ZOFRAN) IV ? ? ?Patient Profile  ?Johnny Oneill is a 89 year with a h/o CAD, chronic HFrEF with HMII LVAD, VT, A flutter, hypothyroidism, HTN, CKD Stage IV, and OA. 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  ? ?Scheduled admit for TKR ? ? ?Assessment/Plan:   ?1)  Osteoarthritis ?- Plan for TKR 4/5 ?- On heparin drip.   ?  ?2) Chronic systolic HF: ICM, EF 29-52% s/p LVAD HM II implant 08/2013 for  DT.   ?- Echo 3/18 EF 10-15% mild AI. Mod RV dysfunction. LVAD cannula OK ?- has struggled with RV failure however RHC 11/22 looked good (despite evidence of RVF on LE dopplers showing reflux ?- Volume status stable. Typically does not need diuretics.  ?- Remains off all diuretics including spiro ?- Failed Jardiance due to volume depletion ?- He has not tolerated ACE-I or amlodipine or b-blocker in the past. ?  ?3) LVAD: HMII 08/2013 for DT. ?-  Admitted in May 2018 with elevated LDH with bival.  ?- Continue aspirin . Last dose of coumadin 3/31 ?- Off coumadin for TKR. Continue heparin drip.  ?- INR 1.3.  ?- LDH 282  ?  ?4) VT/Frequent PVCs ?- now s/p ICD gen change.  ?- Off amio due to cerebellar side effects.  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 Now on sotalol+ mexiletine .  ?- Seen in ER  06/16/21 with recurrent VT. DC-CV x 1 ?- Keep K > 4.0 Mg > 2.0 ?- Recent syncopal event but no arrhythmias on ICD interrogation ?  ?5) Atrial flutter, recurrent ?- patient paced out of AFL in 6/22. Atrial therapies enabled  ?- continue sotalol (on for VT). QTs slightly prolonged but stable ?- may need to consider ablation if recurs ?  ?6) Chronic anticoagulation: INR goal 2.0-3.0.    ?- INR 1.3.  ?-Holding coumadin for TKR. Continue heparin drip.  ?-Continue ASA 81 mg for LVAD  ?  ?7) HTN:  ?- MAPs stable.  ?  ?8) Hypothyroidism:  ?- Followed by Dr Dwyane Dee.  ?- Continue levothyroxine.  ?  ?9) Hyperlipidemia:  ?- Continue Crestor. Zetia stopped due to cost.  ?  ?10) CAD ?- no s/s ischemia ?- continue Crestor ?  ?11) CKD IV ?- Creatinine baseline 2.3 - 2.7 ?- Creatinine 2.4 today.  ?- failed Jardiance ? ? ?I reviewed the LVAD parameters from today, and compared the results to the patient's prior recorded data.  No programming changes were made.  The LVAD is functioning within specified parameters.  The patient performs LVAD self-test daily.  LVAD interrogation was negative for any significant power changes, alarms or PI events/speed drops.  LVAD equipment check completed and is in good working order.  Back-up equipment present.   LVAD education done on emergency procedures and precautions and reviewed exit site care. ? ?Length of Stay: 1 ? ?Darrick Grinder, NP ?12/02/2021, 8:41 AM ? ?VAD Team --- VAD ISSUES ONLY--- ?Pager [phone] (7am - 7am) ? ?Advanced Heart Failure Team  ?Pager [phone] (M-F; 7a - 5p)  ?Please contact [facility] for night-coverage after hours (5p -7a ) and weekends on [email or web address] ? ?Patient seen and examined with the above-signed Advanced Practice Provider and/or Housestaff. I personally reviewed laboratory data, imaging studies and relevant notes. I independently examined the patient and formulated the important aspects of the plan. I have edited the note to reflect any of my changes or salient points. I  have personally discussed the plan with the patient and/or family. ? ?Denies CP or SOB. INR 1.3. On heparin. LDL trending up slightly. No bleeding ? ?General:  NAD.  ?HEENT: normal  ?Neck: supple. JVP not elevated.  Carotids 2+ bilat; no bruits. No lymphadenopathy or thryomegaly appreciated. ?Cor: LVAD hum.  ?Lungs: Clear. ?Abdomen: soft, nontender, non-distended. No hepatosplenomegaly. No bruits or masses. Good bowel sounds. Driveline site clean. Anchor in place.  ?Extremities: no cyanosis, clubbing, rash. Warm no edema  ?Neuro: alert & oriented x 3. No focal deficits. Moves all 4  without problem  ? ?Continue heparin. Would hydrate gently prior to surgery. Consider arterial line for BP monitoring in OR. He is aware of risks of surgery. ? ?Glori Bickers, MD  ?9:22 AM ? ? ? ? ?

## 2021-12-02 NOTE — Progress Notes (Signed)
ANTICOAGULATION CONSULT NOTE ? ?Pharmacy Consult for heparin ?Indication: LVAD ? ?Allergies  ?Allergen Reactions  ? Ace Inhibitors   ?  Hypotension and elevated creatinine  ? Amiodarone Other (See Comments)  ?  Cerebellar side effects  ? Diovan [Valsartan] Other (See Comments)  ?  Hypotension at low dose, hyperkalemia  ? Lipitor [Atorvastatin Calcium] Other (See Comments)  ?  Muscle pain  ? Jardiance [Empagliflozin] Other (See Comments)  ?  Unsure of exact side effect  ? Norvasc [Amlodipine] Other (See Comments)  ?  Put patient to sleep  ? ? ?Patient Measurements: ?Height: 5\' 7"  (170.2 cm) ?Weight: 71.4 kg (157 lb 6.5 oz) ?IBW/kg (Calculated) : 66.1 ? Heparin dosing weight: 71.1 kg ? ?Vital Signs: ?Temp: 97.6 ?F (36.4 ?C) (04/04 4580) ?Temp Source: Oral (04/04 9983) ?BP: 108/92 (04/04 3825) ?Pulse Rate: 74 (04/04 0811) ? ?Labs: ?Recent Labs  ?  12/01/21 ?1646 12/02/21 ?0539 12/02/21 ?7673 12/02/21 ?1024  ?HGB 10.2* 10.5*  --   --   ?HCT 30.9* 32.2*  --   --   ?PLT 170 155  --   --   ?LABPROT 17.1* 16.6*  --   --   ?INR 1.4* 1.3*  --   --   ?HEPARINUNFRC  --   --  0.15* 0.29*  ?CREATININE 2.49* 2.48*  --   --   ? ? ? ?Estimated Creatinine Clearance: 23 mL/min (A) (by C-G formula based on SCr of 2.48 mg/dL (H)). ? ? ?Medical History: ?Past Medical History:  ?Diagnosis Date  ? Anxiety   ? Automatic implantable cardioverter-defibrillator in situ   ? CAD (coronary artery disease)   ? S/P stenting of LCX in 2004;  s/p Lat MI 2009 with occlusion of the LCX - treated with Promus stenting. Has total occlusion of the RCA.   ? CHF (congestive heart failure) ([place])   ? Hypercholesteremia   ? Hypertension   ? Hypothyroidism   ? ICD (implantable cardiac defibrillator) in place   ? PROPHYLACTIC      medtronic  ? Inferior MI ([place]) "? date"; 2009  ? Ischemic cardiomyopathy   ? a. 10/09 Echo: Sev LV Dysfxn, inf/lat AK, Mod MR.  ? Liver disease   ? LVAD (left ventricular assist device) present ([place])   ? Osteoarthritis   ? a. s/p R TKR   ? Pacemaker   ? PVC (premature ventricular contraction)   ? RBBB   ? Shortness of breath   ? ? ?Medications:  ?Medications Prior to Admission  ?Medication Sig Dispense Refill Last Dose  ? acetaminophen (TYLENOL) 500 MG tablet Take 500 mg by mouth every 6 (six) hours as needed (pain).     ? aspirin EC 81 MG tablet Take 81 mg by mouth in the morning.     ? cholecalciferol (VITAMIN D3) 25 MCG (1000 UT) tablet Take 1,000 Units by mouth in the morning.     ? citalopram (CELEXA) 10 MG tablet Take 1 tablet by mouth once daily 90 tablet 3   ? Coenzyme Q10 (COQ10) 200 MG CAPS Take 200 mg by mouth in the morning.     ? famotidine (PEPCID) 20 MG tablet Take 20 mg by mouth in the morning.     ? furosemide (LASIX) 20 MG tablet Take 1 tablet (20 mg total) by mouth daily as needed (Take 20 mg as daily as needed for wt > 153 lbs). Reported on 01/14/2016 (Patient not taking: Reported on 11/26/2021) 30 tablet 6   ? melatonin 5  MG TABS Take 5 mg by mouth at bedtime as needed (sleep).     ? mexiletine (MEXITIL) 150 MG capsule Take 1 capsule (150 mg total) by mouth 2 (two) times daily. 60 capsule 6   ? neomycin-bacitracin-polymyxin (NEOSPORIN) ointment Apply 1 application. topically as needed for wound care. (Patient not taking: Reported on 11/26/2021)     ? pantoprazole (PROTONIX) 40 MG tablet Take 1 tablet by mouth once daily 90 tablet 3   ? potassium chloride SA (KLOR-CON) 20 MEQ tablet Take 1 tablet (20 mEq total) by mouth daily as needed. Take one potassium pill when you take your lasix. (Patient not taking: Reported on 11/26/2021) 30 tablet 6   ? Probiotic Product (PROBIOTIC PO) Take 1 capsule by mouth in the morning.     ? sotalol (BETAPACE) 80 MG tablet TAKE 1 TABLET (80 MG TOTAL) BY MOUTH DAILY. 90 tablet 3   ? SYNTHROID 175 MCG tablet 1 tablet daily every morning except half tablet on Sundays (Patient taking differently: 1 tablet daily every morning) 30 tablet 3   ? traMADol (ULTRAM) 50 MG tablet Take 1-2 tablets (50-100 mg  total) by mouth every 6 (six) hours as needed for moderate pain. (Patient taking differently: Take 50-100 mg by mouth every 6 (six) hours as needed for moderate pain (knee pain).) 45 tablet 5   ? traZODone (DESYREL) 50 MG tablet Take 1 tablet (50 mg total) by mouth at bedtime. (Patient taking differently: Take 50 mg by mouth at bedtime as needed for sleep.) 30 tablet 5   ? rosuvastatin (CRESTOR) 20 MG tablet Take 1 tablet (20 mg total) by mouth daily. 90 tablet 3   ? warfarin (COUMADIN) 5 MG tablet Take 5 mg (1 tablet) every day OR as directed by [facility] 135 tablet 11   ? ?Scheduled:  ? aspirin EC  81 mg Oral q AM  ? citalopram  10 mg Oral Daily  ? levothyroxine  175 mcg Oral Q0600  ? mexiletine  150 mg Oral BID  ? pantoprazole  40 mg Oral Daily  ? rosuvastatin  20 mg Oral QPM  ? sodium chloride flush  3 mL Intravenous Q12H  ? sotalol  80 mg Oral Daily  ? ? ?Assessment: ?81 yo male on warfarin PTA for LVAD (placed 08/2013). Plans are for R TKR on 4/5 (last dose of warfarin given 3/30). Pharmacy consulted to dose heparin.  ? ?Heparin level slightly below goal at 0.29, CBC stable, LDH up. ? ? ?Goal of Therapy:  ?Heparin level 0.3-0.5 ?Monitor platelets by anticoagulation protocol: Yes ?  ?Plan:  ?Increase heparin infusion to 1050 units/hr ?Daily heparin level and CBC ? ?Arrie Senate, PharmD, BCPS, BCCP ?Clinical Pharmacist ?[phone] ?Please check AMION for all [place] numbers ?12/02/2021 ? ? ? ? ? ?

## 2021-12-02 NOTE — Progress Notes (Signed)
ANTICOAGULATION CONSULT NOTE - follow up ? ?Pharmacy Consult for heparin ?Indication: LVAD ? ?Allergies  ?Allergen Reactions  ? Ace Inhibitors   ?  Hypotension and elevated creatinine  ? Amiodarone Other (See Comments)  ?  Cerebellar side effects  ? Diovan [Valsartan] Other (See Comments)  ?  Hypotension at low dose, hyperkalemia  ? Lipitor [Atorvastatin Calcium] Other (See Comments)  ?  Muscle pain  ? Jardiance [Empagliflozin] Other (See Comments)  ?  Unsure of exact side effect  ? Norvasc [Amlodipine] Other (See Comments)  ?  Put patient to sleep  ? ? ?Patient Measurements: ?Height: 5\' 7"  (170.2 cm) ?Weight: 71.1 kg (156 lb 12 oz) ?IBW/kg (Calculated) : 66.1 ? Heparin dosing weight: 71.1 kg ? ?Vital Signs: ?Temp: 98 ?F (36.7 ?C) (04/03 2317) ?Temp Source: Oral (04/03 2317) ?BP: 117/98 (04/03 2317) ?Pulse Rate: 79 (04/03 2317) ? ?Labs: ?Recent Labs  ?  12/01/21 ?1646 12/02/21 ?7989 12/02/21 ?2119  ?HGB 10.2* 10.5*  --   ?HCT 30.9* 32.2*  --   ?PLT 170 155  --   ?LABPROT 17.1* 16.6*  --   ?INR 1.4* 1.3*  --   ?HEPARINUNFRC  --   --  0.15*  ?CREATININE 2.49* 2.48*  --   ? ? ? ?Estimated Creatinine Clearance: 23 mL/min (A) (by C-G formula based on SCr of 2.48 mg/dL (H)). ? ? ?Medical History: ?Past Medical History:  ?Diagnosis Date  ? Anxiety   ? Automatic implantable cardioverter-defibrillator in situ   ? CAD (coronary artery disease)   ? S/P stenting of LCX in 2004;  s/p Lat MI 2009 with occlusion of the LCX - treated with Promus stenting. Has total occlusion of the RCA.   ? CHF (congestive heart failure) ([place])   ? Hypercholesteremia   ? Hypertension   ? Hypothyroidism   ? ICD (implantable cardiac defibrillator) in place   ? PROPHYLACTIC      medtronic  ? Inferior MI ([place]) "? date"; 2009  ? Ischemic cardiomyopathy   ? a. 10/09 Echo: Sev LV Dysfxn, inf/lat AK, Mod MR.  ? Liver disease   ? LVAD (left ventricular assist device) present ([place])   ? Osteoarthritis   ? a. s/p R TKR  ? Pacemaker   ? PVC (premature  ventricular contraction)   ? RBBB   ? Shortness of breath   ? ? ?Medications:  ?Medications Prior to Admission  ?Medication Sig Dispense Refill Last Dose  ? acetaminophen (TYLENOL) 500 MG tablet Take 500 mg by mouth every 6 (six) hours as needed (pain).     ? aspirin EC 81 MG tablet Take 81 mg by mouth in the morning.     ? cholecalciferol (VITAMIN D3) 25 MCG (1000 UT) tablet Take 1,000 Units by mouth in the morning.     ? citalopram (CELEXA) 10 MG tablet Take 1 tablet by mouth once daily 90 tablet 3   ? Coenzyme Q10 (COQ10) 200 MG CAPS Take 200 mg by mouth in the morning.     ? famotidine (PEPCID) 20 MG tablet Take 20 mg by mouth in the morning.     ? furosemide (LASIX) 20 MG tablet Take 1 tablet (20 mg total) by mouth daily as needed (Take 20 mg as daily as needed for wt > 153 lbs). Reported on 01/14/2016 (Patient not taking: Reported on 11/26/2021) 30 tablet 6   ? melatonin 5 MG TABS Take 5 mg by mouth at bedtime as needed (sleep).     ? mexiletine (  MEXITIL) 150 MG capsule Take 1 capsule (150 mg total) by mouth 2 (two) times daily. 60 capsule 6   ? neomycin-bacitracin-polymyxin (NEOSPORIN) ointment Apply 1 application. topically as needed for wound care. (Patient not taking: Reported on 11/26/2021)     ? pantoprazole (PROTONIX) 40 MG tablet Take 1 tablet by mouth once daily 90 tablet 3   ? potassium chloride SA (KLOR-CON) 20 MEQ tablet Take 1 tablet (20 mEq total) by mouth daily as needed. Take one potassium pill when you take your lasix. (Patient not taking: Reported on 11/26/2021) 30 tablet 6   ? Probiotic Product (PROBIOTIC PO) Take 1 capsule by mouth in the morning.     ? sotalol (BETAPACE) 80 MG tablet TAKE 1 TABLET (80 MG TOTAL) BY MOUTH DAILY. 90 tablet 3   ? SYNTHROID 175 MCG tablet 1 tablet daily every morning except half tablet on Sundays (Patient taking differently: 1 tablet daily every morning) 30 tablet 3   ? traMADol (ULTRAM) 50 MG tablet Take 1-2 tablets (50-100 mg total) by mouth every 6 (six) hours  as needed for moderate pain. (Patient taking differently: Take 50-100 mg by mouth every 6 (six) hours as needed for moderate pain (knee pain).) 45 tablet 5   ? traZODone (DESYREL) 50 MG tablet Take 1 tablet (50 mg total) by mouth at bedtime. (Patient taking differently: Take 50 mg by mouth at bedtime as needed for sleep.) 30 tablet 5   ? rosuvastatin (CRESTOR) 20 MG tablet Take 1 tablet (20 mg total) by mouth daily. 90 tablet 3   ? warfarin (COUMADIN) 5 MG tablet Take 5 mg (1 tablet) every day OR as directed by HF Clinic 135 tablet 11   ? ?Scheduled:  ? aspirin EC  81 mg Oral q AM  ? citalopram  10 mg Oral Daily  ? levothyroxine  175 mcg Oral Q0600  ? mexiletine  150 mg Oral BID  ? pantoprazole  40 mg Oral Daily  ? rosuvastatin  20 mg Oral QPM  ? sodium chloride flush  3 mL Intravenous Q12H  ? sotalol  80 mg Oral Daily  ? ? ?Assessment: ?81 yo male on warfarin PTA for LVAD (placed 08/2013). Plans are for R TKR on 4/5 (last dose of warfarin given 3/30). Pharmacy consulted to dose heparin.  ? ?Heparin level is  0.15  6 hour after start of Heparin at 850 units/hr. Hgb 10.5 stable, pltc 155k ,   SCr 2.48.  ?No bleeding noted. ? ? ?Goal of Therapy:  ?Heparin level 0.3-0.5 ?Monitor platelets by anticoagulation protocol: Yes ?  ?Plan:  ?Increase heparin infusion to 950 units/hr ?Heparin level in 8 hours ?Daily HL and CBC ? ?Thank you for allowing pharmacy to be part of this patients care team. ?Nicole Cella, RPh ?Clinical Pharmacist ? ?**Pharmacist phone directory can now be found on [email or web address] ([email or web address]).  Listed under [place]. ? ? ? ? ?

## 2021-12-02 NOTE — TOC Initial Note (Addendum)
Transition of Care (TOC) - Initial/Assessment Note  ? ? ?Patient Details  ?Name: Johnny Oneill ?MRN: [id number] ?Date of Birth: [birth year] ? ?Transition of Care [facility]) CM/SW Contact:    ?Marcheta Grammes Rexene Alberts, RN ?Phone Number: [phone] ?12/02/2021, 11:14 AM ? ?Clinical Narrative:                 ?HF TOC CM spoke to pt at bedside. States he is independent at home. Has scale, rolling walker with seat and rolling walker. Had [facility] in the past but cannot recall agency. Will continue to follow for dc needs.  ? ?Expected Discharge Plan: Home/Self Care ?Barriers to Discharge: Continued Medical Work up ? ? ?Patient Goals and CMS Choice ?Patient states their goals for this hospitalization and ongoing recovery are:: wants to remain independent ?CMS Medicare.gov Compare Post Acute Care list provided to:: Patient ?  ? ?Expected Discharge Plan and Services ?Expected Discharge Plan: Home/Self Care ?  ?Discharge Planning Services: CM Consult ?  ?Living arrangements for the past 2 months: [place] ?                ?DME Arranged: CPM ?DME Agency: [employer] ?  ?  ?  ?HH Arranged: PT ?[place] Agency: [facility] ?  ?  ?  ? ?Prior Living Arrangements/Services ?Living arrangements for the past 2 months: [place] ?Lives with:: Spouse ?Patient language and need for interpreter reviewed:: Yes ?       ?Need for Family Participation in Patient Care: No (Comment) ?Care giver support system in place?: No (comment) ?  ?Criminal Activity/Legal Involvement Pertinent to Current Situation/Hospitalization: No - Comment as needed ? ?Activities of Daily Living ?Home Assistive Devices/Equipment: None ?ADL Screening (condition at time of admission) ?Patient's cognitive ability adequate to safely complete daily activities?: Yes ?Is the patient deaf or have difficulty hearing?: No ?Does the patient have difficulty seeing, even when wearing glasses/contacts?: No ?Does the patient have difficulty concentrating, remembering, or making  decisions?: No ?Patient able to express need for assistance with ADLs?: Yes ?Does the patient have difficulty dressing or bathing?: No ?Independently performs ADLs?: Yes (appropriate for developmental age) ?Does the patient have difficulty walking or climbing stairs?: No ?Weakness of Legs: None ?Weakness of Arms/Hands: None ? ?Permission Sought/Granted ?Permission sought to share information with : Case Manager, Family Supports, PCP ?Permission granted to share information with : Yes, Verbal Permission Granted ? Share Information with NAME: Treson Laura ?   ? Permission granted to share info w Relationship: wife ? Permission granted to share info w Contact Information: [phone] ? ?Emotional Assessment ?Appearance:: Appears stated age ?Attitude/Demeanor/Rapport: Engaged ?Affect (typically observed): Accepting ?Orientation: : Oriented to Self, Oriented to Place, Oriented to  Time, Oriented to Situation ?  ?Psych Involvement: No (comment) ? ?Admission diagnosis:  Osteoarthritis [M19.90] ?Patient Active Problem List  ? Diagnosis Date Noted  ? CKD (chronic kidney disease), stage III ([place]) 06/16/2021  ? Chronic systolic CHF (congestive heart failure) ([place]) 01/02/2015  ? Decreased appetite 11/01/2014  ? Contact dermatitis 09/11/2014  ? Inguinal hernia 08/21/2014  ? Hemothorax 02/08/2014  ? HTN (hypertension) 01/30/2014  ? Chronic anticoagulation 01/08/2014  ? Hypothyroid 12/20/2013  ? Syncope 11/28/2013  ? Hemothorax on right 11/06/2013  ? Poor venous access 11/02/2013  ? LVAD (left ventricular assist device) present ([place]) 09/18/2013  ? Weakness generalized 09/15/2013  ? Nonspecific (abnormal) findings on radiological and other examination of gastrointestinal tract 09/14/2013  ? Anemia, unspecified 09/14/2013  ? Postablative hypothyroidism  03/17/2013  ? PVC (premature ventricular contraction) 12/08/2012  ? VT (ventricular tachycardia) 12/08/2012  ? Essential hypertension, benign 11/12/2010  ? ISCHEMIC CARDIOMYOPATHY  11/12/2010  ? Chronic systolic heart failure ([place]) 11/12/2010  ? Ischemic cardiomyopathy   ? Myocardial infarction of lateral wall ([facility])   ? Hypercholesteremia   ? Automatic implantable cardioverter-defibrillator in situ   ? CAD (coronary artery disease)   ? Osteoarthritis   ? ?PCP:  Lavone Orn, MD ?Pharmacy:   ?[facility] [address] ?[place] [address] ?Phone: [phone] Fax: [phone] ? ?[facility] ?[address] ?[place] [address] ?Phone: [phone] Fax: [phone] ? ?[facility] ?[address] ?[place] [address] ?Phone: [phone] Fax: [phone] ? ? ? ? ?Social Determinants of Health (SDOH) Interventions ?  ? ?Readmission Risk Interventions ?   ? View : No data to display.  ?  ?  ?  ? ? ? ?

## 2021-12-02 NOTE — Progress Notes (Signed)
LVAD Coordinator Rounding Note: ? ?Admitted 12/01/21 to Dr. Haroldine Laws service for heparin bridge for scheduled right total knee arthroplasty. ? 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 Darcey [name] under bridge to transplant criteria. Patient was removed from transplant list 05/2016 per his choice. He is currently Destination Therapy LVAD.  ? ?Pt ambulating in room this am; reports he had large BM. Denies complaints. ? ?Vital signs: ?Temp: 97.6 ?HR:   81 AV paced with occasional PVCs ?Doppler Pressure:  98 ?Automatic BP: 108/92 (98) ?O2 Sat:  97% on RA ?Wt: 156.7>157.4 lbs   ? ? ?LVAD interrogation reveals:  ?Speed:  9600 ?Flow:  4.3 ?Power:  5.6w ?PI: 5.5  ?Alarms: none ?Events:  10 - 20 PI events daily ? ?Fixed speed:  9600 ?Low speed limit: 9000 ? ?Drive Line:  Drive line is being maintained weekly by [name], his wife. Existing VAD dressing C/D/I Sorbaview dressing and Silverlon patch intact. Exit site healed and incorporated, the velour is fully implanted at exit site. Pt denies fever or chills. Weekly dressing changes per [facility] nurse or caregiver. Next dressing change due 12/07/21. ? ?Labs:  ?LDH trend: 249>282 ? ?INR trend:  1.4>1.3 ? ?Anticoagulation Plan: ?-INR Goal:  2.5 - 3.0 ?-ASA Dose: 81 mg daily ? ? ?Device: Medtronic dual ?- Therapies: on 231 bpm ?- Pacing:  AAI<=>DDD 80 bpm ? ?Gtts:  ?- Heparin 950 units/hr ? ? ?Plan/Recommendations:  ?Call VAD Coordinator with any VAD equipment or drive line issues. ?VAD Coordinator will accompany and remain with patient for surgical case scheduled tomorrow at noon.  ? ?Zada Girt RN, VAD Coordinator ?[phone] ? ? ?

## 2021-12-03 ENCOUNTER — Inpatient Hospital Stay (HOSPITAL_COMMUNITY): Payer: Medicare Other | Admitting: Anesthesiology

## 2021-12-03 ENCOUNTER — Encounter (HOSPITAL_COMMUNITY)
Admission: RE | Disposition: A | Discharge status: Home Health | Payer: Self-pay | Source: Home / Self Care | Attending: Internal Medicine

## 2021-12-03 ENCOUNTER — Encounter (HOSPITAL_COMMUNITY): Payer: Self-pay | Admitting: Orthopedic Surgery

## 2021-12-03 ENCOUNTER — Inpatient Hospital Stay (HOSPITAL_COMMUNITY): Payer: Medicare Other | Admitting: Vascular Surgery

## 2021-12-03 ENCOUNTER — Other Ambulatory Visit: Payer: Self-pay

## 2021-12-03 DIAGNOSIS — N189 Chronic kidney disease, unspecified: Secondary | ICD-10-CM

## 2021-12-03 DIAGNOSIS — I5022 Chronic systolic (congestive) heart failure: Secondary | ICD-10-CM | POA: Diagnosis not present

## 2021-12-03 DIAGNOSIS — M158 Other polyosteoarthritis: Secondary | ICD-10-CM

## 2021-12-03 DIAGNOSIS — Z95811 Presence of heart assist device: Secondary | ICD-10-CM | POA: Diagnosis not present

## 2021-12-03 DIAGNOSIS — I2510 Atherosclerotic heart disease of native coronary artery without angina pectoris: Secondary | ICD-10-CM

## 2021-12-03 DIAGNOSIS — I252 Old myocardial infarction: Secondary | ICD-10-CM

## 2021-12-03 DIAGNOSIS — M1711 Unilateral primary osteoarthritis, right knee: Secondary | ICD-10-CM

## 2021-12-03 DIAGNOSIS — I130 Hypertensive heart and chronic kidney disease with heart failure and stage 1 through stage 4 chronic kidney disease, or unspecified chronic kidney disease: Secondary | ICD-10-CM

## 2021-12-03 HISTORY — PX: TOTAL KNEE ARTHROPLASTY: SHX125

## 2021-12-03 LAB — LACTATE DEHYDROGENASE: LDH: 172 U/L (ref 98–192)

## 2021-12-03 LAB — CBC
HCT: 30.4 % — ABNORMAL LOW (ref 39.0–52.0)
Hemoglobin: 10 g/dL — ABNORMAL LOW (ref 13.0–17.0)
MCH: 30.2 pg (ref 26.0–34.0)
MCHC: 32.9 g/dL (ref 30.0–36.0)
MCV: 91.8 fL (ref 80.0–100.0)
Platelets: 157 10*3/uL (ref 150–400)
RBC: 3.31 MIL/uL — ABNORMAL LOW (ref 4.22–5.81)
RDW: 14.7 % (ref 11.5–15.5)
WBC: 4.8 10*3/uL (ref 4.0–10.5)
nRBC: 0 % (ref 0.0–0.2)

## 2021-12-03 LAB — BASIC METABOLIC PANEL
Anion gap: 7 (ref 5–15)
BUN: 35 mg/dL — ABNORMAL HIGH (ref 8–23)
CO2: 22 mmol/L (ref 22–32)
Calcium: 8.9 mg/dL (ref 8.9–10.3)
Chloride: 108 mmol/L (ref 98–111)
Creatinine, Ser: 2.33 mg/dL — ABNORMAL HIGH (ref 0.61–1.24)
GFR, Estimated: 28 mL/min — ABNORMAL LOW (ref >60)
Glucose, Bld: 214 mg/dL — ABNORMAL HIGH (ref 70–99)
Potassium: 3.7 mmol/L (ref 3.5–5.1)
Sodium: 137 mmol/L (ref 135–145)

## 2021-12-03 LAB — PROTIME-INR
INR: 1.3 — ABNORMAL HIGH (ref 0.8–1.2)
Prothrombin Time: 16.4 seconds — ABNORMAL HIGH (ref 11.4–15.2)

## 2021-12-03 LAB — HEPARIN LEVEL (UNFRACTIONATED): Heparin Unfractionated: 0.42 IU/mL (ref 0.30–0.70)

## 2021-12-03 SURGERY — ARTHROPLASTY, KNEE, TOTAL
Anesthesia: General | Site: Knee | Laterality: Right

## 2021-12-03 MED ORDER — ROCURONIUM BROMIDE 100 MG/10ML IV SOLN
INTRAVENOUS | Status: DC | PRN
  Administered 2021-12-03: 60 mg via INTRAVENOUS

## 2021-12-03 MED ORDER — ARTIFICIAL TEARS OPHTHALMIC OINT
TOPICAL_OINTMENT | OPHTHALMIC | Status: AC
  Filled 2021-12-03: qty 3.5

## 2021-12-03 MED ORDER — BUPIVACAINE-EPINEPHRINE (PF) 0.25% -1:200000 IJ SOLN
INTRAMUSCULAR | Status: DC | PRN
  Administered 2021-12-03: 30 mL

## 2021-12-03 MED ORDER — HYDROMORPHONE HCL 2 MG PO TABS
1.0000 mg | ORAL_TABLET | ORAL | Status: DC | PRN
  Administered 2021-12-04: 2 mg via ORAL
  Filled 2021-12-03 (×2): qty 1

## 2021-12-03 MED ORDER — DEXAMETHASONE SODIUM PHOSPHATE 10 MG/ML IJ SOLN
INTRAMUSCULAR | Status: DC | PRN
  Administered 2021-12-03: 5 mg via INTRAVENOUS

## 2021-12-03 MED ORDER — KETAMINE HCL 50 MG/5ML IJ SOSY
PREFILLED_SYRINGE | INTRAMUSCULAR | Status: AC
  Filled 2021-12-03: qty 5

## 2021-12-03 MED ORDER — SODIUM CHLORIDE 0.9 % IR SOLN
Status: DC | PRN
  Administered 2021-12-03: 3000 mL

## 2021-12-03 MED ORDER — PHENOL 1.4 % MT LIQD: 1.0000 | OROMUCOSAL | Status: DC | PRN

## 2021-12-03 MED ORDER — SODIUM CHLORIDE 0.9% FLUSH
INTRAVENOUS | Status: DC | PRN
  Administered 2021-12-03: 50 mL

## 2021-12-03 MED ORDER — CHLORHEXIDINE GLUCONATE 0.12 % MT SOLN: 15.0000 mL | Freq: Once | OROMUCOSAL | Status: AC

## 2021-12-03 MED ORDER — TRANEXAMIC ACID 1000 MG/10ML IV SOLN
2000.0000 mg | Freq: Once | INTRAVENOUS | Status: AC
  Administered 2021-12-03: 2000 mg via TOPICAL
  Filled 2021-12-03: qty 20

## 2021-12-03 MED ORDER — SORBITOL 70 % SOLN: 30.0000 mL | Freq: Every day | Status: DC | PRN

## 2021-12-03 MED ORDER — EPHEDRINE SULFATE-NACL 50-0.9 MG/10ML-% IV SOSY
PREFILLED_SYRINGE | INTRAVENOUS | Status: DC | PRN
  Administered 2021-12-03: 5 mg via INTRAVENOUS
  Administered 2021-12-03: 10 mg via INTRAVENOUS

## 2021-12-03 MED ORDER — SODIUM CHLORIDE 0.9 % IV SOLN: INTRAVENOUS | Status: DC

## 2021-12-03 MED ORDER — CHLORHEXIDINE GLUCONATE 0.12 % MT SOLN
OROMUCOSAL | Status: AC
Start: 2021-12-03 — End: 2021-12-03
  Administered 2021-12-03: 15 mL via OROMUCOSAL
  Filled 2021-12-03: qty 15

## 2021-12-03 MED ORDER — BUPIVACAINE LIPOSOME 1.3 % IJ SUSP
INTRAMUSCULAR | Status: DC | PRN
  Administered 2021-12-03: 20 mL

## 2021-12-03 MED ORDER — PHENYLEPHRINE 40 MCG/ML (10ML) SYRINGE FOR IV PUSH (FOR BLOOD PRESSURE SUPPORT)
PREFILLED_SYRINGE | INTRAVENOUS | Status: DC | PRN
  Administered 2021-12-03: 120 ug via INTRAVENOUS
  Administered 2021-12-03: 80 ug via INTRAVENOUS
  Administered 2021-12-03: 120 ug via INTRAVENOUS
  Administered 2021-12-03 (×3): 80 ug via INTRAVENOUS

## 2021-12-03 MED ORDER — LIDOCAINE 2% (20 MG/ML) 5 ML SYRINGE
INTRAMUSCULAR | Status: AC
  Filled 2021-12-03: qty 5

## 2021-12-03 MED ORDER — METOCLOPRAMIDE HCL 5 MG/ML IJ SOLN: 5.0000 mg | Freq: Three times a day (TID) | INTRAMUSCULAR | Status: DC | PRN

## 2021-12-03 MED ORDER — BUPIVACAINE-EPINEPHRINE (PF) 0.5% -1:200000 IJ SOLN
INTRAMUSCULAR | Status: DC | PRN
  Administered 2021-12-03: 20 mL via PERINEURAL

## 2021-12-03 MED ORDER — PHENYLEPHRINE 40 MCG/ML (10ML) SYRINGE FOR IV PUSH (FOR BLOOD PRESSURE SUPPORT)
PREFILLED_SYRINGE | INTRAVENOUS | Status: AC
  Filled 2021-12-03: qty 20

## 2021-12-03 MED ORDER — ORAL CARE MOUTH RINSE: 15.0000 mL | Freq: Once | OROMUCOSAL | Status: AC

## 2021-12-03 MED ORDER — LIDOCAINE 2% (20 MG/ML) 5 ML SYRINGE
INTRAMUSCULAR | Status: DC | PRN
  Administered 2021-12-03: 60 mg via INTRAVENOUS

## 2021-12-03 MED ORDER — FENTANYL CITRATE (PF) 100 MCG/2ML IJ SOLN
INTRAMUSCULAR | Status: AC
  Administered 2021-12-03: 50 ug via INTRAVENOUS
  Filled 2021-12-03: qty 2

## 2021-12-03 MED ORDER — FENTANYL CITRATE (PF) 250 MCG/5ML IJ SOLN
INTRAMUSCULAR | Status: DC | PRN
  Administered 2021-12-03 (×5): 50 ug via INTRAVENOUS

## 2021-12-03 MED ORDER — CEFAZOLIN SODIUM-DEXTROSE 1-4 GM/50ML-% IV SOLN
1.0000 g | Freq: Once | INTRAVENOUS | Status: AC
  Administered 2021-12-04: 1 g via INTRAVENOUS
  Filled 2021-12-03: qty 50

## 2021-12-03 MED ORDER — FENTANYL CITRATE (PF) 250 MCG/5ML IJ SOLN
INTRAMUSCULAR | Status: AC
  Filled 2021-12-03: qty 5

## 2021-12-03 MED ORDER — HYDROMORPHONE HCL 1 MG/ML IJ SOLN
INTRAMUSCULAR | Status: AC
  Filled 2021-12-03: qty 0.5

## 2021-12-03 MED ORDER — ACETAMINOPHEN 500 MG PO TABS
1000.0000 mg | ORAL_TABLET | Freq: Four times a day (QID) | ORAL | Status: AC
  Administered 2021-12-03 – 2021-12-04 (×4): 1000 mg via ORAL
  Filled 2021-12-03 (×4): qty 2

## 2021-12-03 MED ORDER — PROPOFOL 10 MG/ML IV BOLUS
INTRAVENOUS | Status: DC | PRN
  Administered 2021-12-03: 10 mg via INTRAVENOUS
  Administered 2021-12-03: 110 mg via INTRAVENOUS

## 2021-12-03 MED ORDER — KETAMINE HCL 10 MG/ML IJ SOLN
INTRAMUSCULAR | Status: DC | PRN
  Administered 2021-12-03: 20 mg via INTRAVENOUS
  Administered 2021-12-03: 15 mg via INTRAVENOUS

## 2021-12-03 MED ORDER — POLYETHYLENE GLYCOL 3350 17 G PO PACK: 17.0000 g | PACK | Freq: Every day | ORAL | Status: DC | PRN

## 2021-12-03 MED ORDER — FENTANYL CITRATE (PF) 100 MCG/2ML IJ SOLN: 50.0000 ug | Freq: Once | INTRAMUSCULAR | Status: AC

## 2021-12-03 MED ORDER — ROCURONIUM BROMIDE 10 MG/ML (PF) SYRINGE
PREFILLED_SYRINGE | INTRAVENOUS | Status: AC
  Filled 2021-12-03: qty 10

## 2021-12-03 MED ORDER — METOCLOPRAMIDE HCL 5 MG PO TABS
5.0000 mg | ORAL_TABLET | Freq: Three times a day (TID) | ORAL | Status: DC | PRN
  Filled 2021-12-03: qty 1

## 2021-12-03 MED ORDER — HYDROMORPHONE HCL 1 MG/ML IJ SOLN
INTRAMUSCULAR | Status: DC | PRN
  Administered 2021-12-03: .25 mg via INTRAVENOUS

## 2021-12-03 MED ORDER — MENTHOL 3 MG MT LOZG: 1.0000 | LOZENGE | OROMUCOSAL | Status: DC | PRN

## 2021-12-03 MED ORDER — MIDAZOLAM HCL 2 MG/2ML IJ SOLN
INTRAMUSCULAR | Status: AC
  Filled 2021-12-03: qty 2

## 2021-12-03 MED ORDER — EPHEDRINE 5 MG/ML INJ
INTRAVENOUS | Status: AC
  Filled 2021-12-03: qty 5

## 2021-12-03 MED ORDER — TRANEXAMIC ACID-NACL 1000-0.7 MG/100ML-% IV SOLN
INTRAVENOUS | Status: AC
  Filled 2021-12-03: qty 100

## 2021-12-03 MED ORDER — SUGAMMADEX SODIUM 200 MG/2ML IV SOLN
INTRAVENOUS | Status: DC | PRN
  Administered 2021-12-03: 100 mg via INTRAVENOUS
  Administered 2021-12-03 (×2): 50 mg via INTRAVENOUS

## 2021-12-03 MED ORDER — DEXAMETHASONE SODIUM PHOSPHATE 10 MG/ML IJ SOLN
INTRAMUSCULAR | Status: AC
  Filled 2021-12-03: qty 1

## 2021-12-03 MED ORDER — ALBUMIN HUMAN 5 % IV SOLN
INTRAVENOUS | Status: DC | PRN
Start: 2021-12-03 — End: 2021-12-03

## 2021-12-03 MED ORDER — ONDANSETRON HCL 4 MG/2ML IJ SOLN
INTRAMUSCULAR | Status: AC
  Filled 2021-12-03: qty 2

## 2021-12-03 MED ORDER — HYDROMORPHONE HCL 1 MG/ML IJ SOLN
0.5000 mg | INTRAMUSCULAR | Status: DC | PRN
  Administered 2021-12-03: 0.5 mg via INTRAVENOUS
  Filled 2021-12-03: qty 0.5

## 2021-12-03 MED ORDER — OXYCODONE HCL 5 MG PO TABS
5.0000 mg | ORAL_TABLET | ORAL | Status: DC | PRN
  Administered 2021-12-04 – 2021-12-06 (×7): 5 mg via ORAL
  Administered 2021-12-06: 10 mg via ORAL
  Administered 2021-12-06: 5 mg via ORAL
  Administered 2021-12-07: 10 mg via ORAL
  Administered 2021-12-07 – 2021-12-08 (×3): 5 mg via ORAL
  Administered 2021-12-08: 10 mg via ORAL
  Administered 2021-12-09 – 2021-12-10 (×4): 5 mg via ORAL
  Filled 2021-12-03 (×4): qty 1
  Filled 2021-12-03: qty 2
  Filled 2021-12-03: qty 1
  Filled 2021-12-03: qty 2
  Filled 2021-12-03 (×2): qty 1
  Filled 2021-12-03: qty 2
  Filled 2021-12-03: qty 1
  Filled 2021-12-03: qty 2
  Filled 2021-12-03: qty 1
  Filled 2021-12-03: qty 2
  Filled 2021-12-03 (×4): qty 1

## 2021-12-03 MED ORDER — DOCUSATE SODIUM 100 MG PO CAPS
100.0000 mg | ORAL_CAPSULE | Freq: Two times a day (BID) | ORAL | Status: DC
  Administered 2021-12-04 – 2021-12-12 (×10): 100 mg via ORAL
  Filled 2021-12-03 (×15): qty 1

## 2021-12-03 MED ORDER — BUPIVACAINE-EPINEPHRINE (PF) 0.25% -1:200000 IJ SOLN
INTRAMUSCULAR | Status: AC
  Filled 2021-12-03: qty 30

## 2021-12-03 MED ORDER — BUPIVACAINE LIPOSOME 1.3 % IJ SUSP
INTRAMUSCULAR | Status: AC
  Filled 2021-12-03: qty 20

## 2021-12-03 MED ORDER — PHENYLEPHRINE HCL-NACL 20-0.9 MG/250ML-% IV SOLN
INTRAVENOUS | Status: DC | PRN
  Administered 2021-12-03: 50 ug/min via INTRAVENOUS

## 2021-12-03 SURGICAL SUPPLY — 76 items
APL SKNCLS STERI-STRIP NONHPOA (GAUZE/BANDAGES/DRESSINGS) ×1
ATTUNE MED DOME PAT 41 KNEE (Knees) ×1 IMPLANT
ATTUNE PS FEM RT SZ 6 CEM KNEE (Femur) ×1 IMPLANT
ATTUNE PSRP INSR SZ6 5 KNEE (Insert) ×1 IMPLANT
BAG COUNTER SPONGE SURGICOUNT (BAG) ×2 IMPLANT
BAG SPNG CNTER NS LX DISP (BAG) ×1
BANDAGE ESMARK 6X9 LF (GAUZE/BANDAGES/DRESSINGS) ×1 IMPLANT
BASE TIBIAL ROT PLAT SZ 7 KNEE (Knees) IMPLANT
BENZOIN TINCTURE PRP APPL 2/3 (GAUZE/BANDAGES/DRESSINGS) ×1 IMPLANT
BLADE SAGITTAL 25.0X1.19X90 (BLADE) ×2 IMPLANT
BLADE SAW SAG 90X13X1.27 (BLADE) ×2 IMPLANT
BNDG CMPR 9X6 STRL LF SNTH (GAUZE/BANDAGES/DRESSINGS) ×1
BNDG ELASTIC 6X5.8 VLCR STR LF (GAUZE/BANDAGES/DRESSINGS) ×2 IMPLANT
BNDG ESMARK 6X9 LF (GAUZE/BANDAGES/DRESSINGS) ×2
BONE CEMENT GENTAMICIN (Cement) ×4 IMPLANT
BOWL SMART MIX CTS (DISPOSABLE) ×2 IMPLANT
BSPLAT TIB 7 CMNT ROT PLAT STR (Knees) ×1 IMPLANT
CEMENT BONE GENTAMICIN 40 (Cement) IMPLANT
COVER SURGICAL LIGHT HANDLE (MISCELLANEOUS) ×2 IMPLANT
CUFF TOURN SGL QUICK 34 (TOURNIQUET CUFF) ×2
CUFF TRNQT CYL 34X4.125X (TOURNIQUET CUFF) ×1 IMPLANT
DRAPE INCISE IOBAN 66X45 STRL (DRAPES) ×1 IMPLANT
DRAPE ORTHO SPLIT 77X108 STRL (DRAPES) ×4
DRAPE SURG ORHT 6 SPLT 77X108 (DRAPES) ×2 IMPLANT
DRAPE U-SHAPE 47X51 STRL (DRAPES) ×2 IMPLANT
DRSG ADAPTIC 3X8 NADH LF (GAUZE/BANDAGES/DRESSINGS) ×2 IMPLANT
DRSG AQUACEL AG ADV 3.5X10 (GAUZE/BANDAGES/DRESSINGS) ×1 IMPLANT
DRSG PAD ABDOMINAL 8X10 ST (GAUZE/BANDAGES/DRESSINGS) ×4 IMPLANT
DURAPREP 26ML APPLICATOR (WOUND CARE) ×2 IMPLANT
ELECT REM PT RETURN 9FT ADLT (ELECTROSURGICAL) ×2
ELECTRODE REM PT RTRN 9FT ADLT (ELECTROSURGICAL) ×1 IMPLANT
EVACUATOR 1/8 PVC DRAIN (DRAIN) IMPLANT
FACESHIELD WRAPAROUND (MASK) ×2 IMPLANT
FACESHIELD WRAPAROUND OR TEAM (MASK) ×1 IMPLANT
GAUZE SPONGE 4X4 12PLY STRL (GAUZE/BANDAGES/DRESSINGS) ×2 IMPLANT
GLOVE SRG 8 PF TXTR STRL LF DI (GLOVE) ×4 IMPLANT
GLOVE SURG ORTHO LTX SZ7.5 (GLOVE) ×3 IMPLANT
GLOVE SURG ORTHO LTX SZ8 (GLOVE) ×3 IMPLANT
GLOVE SURG UNDER POLY LF SZ8 (GLOVE) ×8
GOWN STRL REUS W/ TWL LRG LVL3 (GOWN DISPOSABLE) ×1 IMPLANT
GOWN STRL REUS W/ TWL XL LVL3 (GOWN DISPOSABLE) ×1 IMPLANT
GOWN STRL REUS W/TWL 2XL LVL3 (GOWN DISPOSABLE) ×2 IMPLANT
GOWN STRL REUS W/TWL LRG LVL3 (GOWN DISPOSABLE) ×2
GOWN STRL REUS W/TWL XL LVL3 (GOWN DISPOSABLE) ×2
HANDPIECE INTERPULSE COAX TIP (DISPOSABLE) ×2
HOOD PEEL AWAY FACE SHEILD DIS (HOOD) ×3 IMPLANT
IMMOBILIZER KNEE 22 UNIV (SOFTGOODS) IMPLANT
KIT BASIN OR (CUSTOM PROCEDURE TRAY) ×2 IMPLANT
KIT TURNOVER KIT B (KITS) ×2 IMPLANT
MANIFOLD NEPTUNE II (INSTRUMENTS) ×2 IMPLANT
NEEDLE 22X1 1/2 (OR ONLY) (NEEDLE) ×1 IMPLANT
NS IRRIG 1000ML POUR BTL (IV SOLUTION) ×2 IMPLANT
PACK TOTAL JOINT (CUSTOM PROCEDURE TRAY) ×2 IMPLANT
PAD ARMBOARD 7.5X6 YLW CONV (MISCELLANEOUS) ×4 IMPLANT
PAD CAST 4YDX4 CTTN HI CHSV (CAST SUPPLIES) ×1 IMPLANT
PADDING CAST COTTON 4X4 STRL (CAST SUPPLIES)
PADDING CAST COTTON 6X4 STRL (CAST SUPPLIES) ×2 IMPLANT
SET HNDPC FAN SPRY TIP SCT (DISPOSABLE) ×1 IMPLANT
SPONGE T-LAP 18X18 ~~LOC~~+RFID (SPONGE) ×2 IMPLANT
STAPLER VISISTAT 35W (STAPLE) ×2 IMPLANT
STRIP CLOSURE SKIN 1/2X4 (GAUZE/BANDAGES/DRESSINGS) ×1 IMPLANT
SUCTION FRAZIER HANDLE 10FR (MISCELLANEOUS) ×2
SUCTION TUBE FRAZIER 10FR DISP (MISCELLANEOUS) ×1 IMPLANT
SUT ETHIBOND NAB CT1 #1 30IN (SUTURE) ×6 IMPLANT
SUT MNCRL AB 3-0 PS2 27 (SUTURE) ×1 IMPLANT
SUT VIC AB 0 CT1 27 (SUTURE) ×2
SUT VIC AB 0 CT1 27XBRD ANBCTR (SUTURE) ×1 IMPLANT
SUT VIC AB 2-0 CT1 27 (SUTURE) ×4
SUT VIC AB 2-0 CT1 TAPERPNT 27 (SUTURE) ×2 IMPLANT
SYR 20ML LL LF (SYRINGE) ×1 IMPLANT
SYR CONTROL 10ML LL (SYRINGE) ×4 IMPLANT
TIBIAL BASE ROT PLAT SZ 7 KNEE (Knees) ×2 IMPLANT
TOWEL GREEN STERILE (TOWEL DISPOSABLE) ×2 IMPLANT
TRAY FOLEY MTR SLVR 16FR STAT (SET/KITS/TRAYS/PACK) IMPLANT
WATER STERILE IRR 1000ML POUR (IV SOLUTION) ×2 IMPLANT
YANKAUER SUCT BULB TIP NO VENT (SUCTIONS) ×1 IMPLANT

## 2021-12-03 NOTE — Progress Notes (Signed)
ANTICOAGULATION CONSULT NOTE ? ?Pharmacy Consult for heparin ?Indication: LVAD ? ?Allergies  ?Allergen Reactions  ? Ace Inhibitors   ?  Hypotension and elevated creatinine  ? Amiodarone Other (See Comments)  ?  Cerebellar side effects  ? Diovan [Valsartan] Other (See Comments)  ?  Hypotension at low dose, hyperkalemia  ? Lipitor [Atorvastatin Calcium] Other (See Comments)  ?  Muscle pain  ? Jardiance [Empagliflozin] Other (See Comments)  ?  Unsure of exact side effect  ? Norvasc [Amlodipine] Other (See Comments)  ?  Put patient to sleep  ? ? ?Patient Measurements: ?Height: 5\' 7"  (170.2 cm) ?Weight: 71.9 kg (158 lb 8.2 oz) ?IBW/kg (Calculated) : 66.1 ? Heparin dosing weight: 71.1 kg ? ?Vital Signs: ?Temp: 97.6 ?F (36.4 ?C) (04/05 3762) ?Temp Source: Oral (04/05 0731) ?BP: 117/101 (04/05 0731) ?Pulse Rate: 68 (04/05 0731) ? ?Labs: ?Recent Labs  ?  12/01/21 ?1646 12/02/21 ?8315 12/02/21 ?1761 12/02/21 ?1024 12/02/21 ?1640 12/03/21 ?0056  ?HGB 10.2* 10.5*  --   --   --  10.0*  ?HCT 30.9* 32.2*  --   --   --  30.4*  ?PLT 170 155  --   --   --  157  ?LABPROT 17.1* 16.6*  --   --   --  16.4*  ?INR 1.4* 1.3*  --   --   --  1.3*  ?HEPARINUNFRC  --   --    < > 0.29* 0.39 0.42  ?CREATININE 2.49* 2.48*  --   --   --  2.33*  ? < > = values in this interval not displayed.  ? ? ? ?Estimated Creatinine Clearance: 24.4 mL/min (A) (by C-G formula based on SCr of 2.33 mg/dL (H)). ? ? ?Medical History: ?Past Medical History:  ?Diagnosis Date  ? Anxiety   ? Automatic implantable cardioverter-defibrillator in situ   ? CAD (coronary artery disease)   ? S/P stenting of LCX in 2004;  s/p Lat MI 2009 with occlusion of the LCX - treated with Promus stenting. Has total occlusion of the RCA.   ? CHF (congestive heart failure) ([place])   ? Hypercholesteremia   ? Hypertension   ? Hypothyroidism   ? ICD (implantable cardiac defibrillator) in place   ? PROPHYLACTIC      medtronic  ? Inferior MI ([place]) "? date"; 2009  ? Ischemic cardiomyopathy   ? a.  10/09 Echo: Sev LV Dysfxn, inf/lat AK, Mod MR.  ? Liver disease   ? LVAD (left ventricular assist device) present ([place])   ? Osteoarthritis   ? a. s/p R TKR  ? Pacemaker   ? PVC (premature ventricular contraction)   ? RBBB   ? Shortness of breath   ? ? ?Medications:  ?Medications Prior to Admission  ?Medication Sig Dispense Refill Last Dose  ? acetaminophen (TYLENOL) 500 MG tablet Take 500 mg by mouth every 6 (six) hours as needed (pain).     ? aspirin EC 81 MG tablet Take 81 mg by mouth in the morning.     ? cholecalciferol (VITAMIN D3) 25 MCG (1000 UT) tablet Take 1,000 Units by mouth in the morning.     ? citalopram (CELEXA) 10 MG tablet Take 1 tablet by mouth once daily 90 tablet 3   ? Coenzyme Q10 (COQ10) 200 MG CAPS Take 200 mg by mouth in the morning.     ? famotidine (PEPCID) 20 MG tablet Take 20 mg by mouth in the morning.     ? furosemide (  LASIX) 20 MG tablet Take 1 tablet (20 mg total) by mouth daily as needed (Take 20 mg as daily as needed for wt > 153 lbs). Reported on 01/14/2016 (Patient not taking: Reported on 11/26/2021) 30 tablet 6   ? melatonin 5 MG TABS Take 5 mg by mouth at bedtime as needed (sleep).     ? mexiletine (MEXITIL) 150 MG capsule Take 1 capsule (150 mg total) by mouth 2 (two) times daily. 60 capsule 6   ? neomycin-bacitracin-polymyxin (NEOSPORIN) ointment Apply 1 application. topically as needed for wound care. (Patient not taking: Reported on 11/26/2021)     ? pantoprazole (PROTONIX) 40 MG tablet Take 1 tablet by mouth once daily 90 tablet 3   ? potassium chloride SA (KLOR-CON) 20 MEQ tablet Take 1 tablet (20 mEq total) by mouth daily as needed. Take one potassium pill when you take your lasix. (Patient not taking: Reported on 11/26/2021) 30 tablet 6   ? Probiotic Product (PROBIOTIC PO) Take 1 capsule by mouth in the morning.     ? sotalol (BETAPACE) 80 MG tablet TAKE 1 TABLET (80 MG TOTAL) BY MOUTH DAILY. 90 tablet 3   ? SYNTHROID 175 MCG tablet 1 tablet daily every morning except half  tablet on Sundays (Patient taking differently: 1 tablet daily every morning) 30 tablet 3   ? traMADol (ULTRAM) 50 MG tablet Take 1-2 tablets (50-100 mg total) by mouth every 6 (six) hours as needed for moderate pain. (Patient taking differently: Take 50-100 mg by mouth every 6 (six) hours as needed for moderate pain (knee pain).) 45 tablet 5   ? traZODone (DESYREL) 50 MG tablet Take 1 tablet (50 mg total) by mouth at bedtime. (Patient taking differently: Take 50 mg by mouth at bedtime as needed for sleep.) 30 tablet 5   ? rosuvastatin (CRESTOR) 20 MG tablet Take 1 tablet (20 mg total) by mouth daily. 90 tablet 3   ? warfarin (COUMADIN) 5 MG tablet Take 5 mg (1 tablet) every day OR as directed by [facility] Clinic 135 tablet 11   ? ?Scheduled:  ? acetaminophen  1,000 mg Oral Once  ? aspirin EC  81 mg Oral q AM  ? bupivacaine liposome  20 mL Other Once  ? citalopram  10 mg Oral Daily  ? levothyroxine  175 mcg Oral Q0600  ? mexiletine  150 mg Oral BID  ? pantoprazole  40 mg Oral Daily  ? rosuvastatin  20 mg Oral QPM  ? sodium chloride flush  3 mL Intravenous Q12H  ? sotalol  80 mg Oral Daily  ? ? ?Assessment: ?81 yo male on warfarin PTA for LVAD (placed 08/2013). Plans are for R TKR on 4/5 (last dose of warfarin given 3/30). INR 1.3 today. ?Pharmacy consulted to dose heparin while INR < 2 ? ?Heparin level  at goal at 0.42 on heparin drip 1050 uts/hr  CBC stable, LDH back to baseline  220>280>170. ? ? ?Goal of Therapy:  ?Heparin level 0.3-0.5 ?Monitor platelets by anticoagulation protocol: Yes ?  ?Plan:  ? heparin infusion 1050 units/hr - will stop now preop and resume post op when able per Ortho ?Daily heparin level and CBC ?Follow up plans post op  ? ? ?Bonnita Nasuti Pharm.D. CPP, BCPS ?Clinical Pharmacist ?[phone] ?12/03/2021 9:49 AM  ? ?Please check AMION for all [place] numbers ?12/03/2021 ? ? ? ? ? ?

## 2021-12-03 NOTE — Progress Notes (Signed)
Patient went into VT at 2302 with rates 160s.  Pt asymptomatic.  Cuff pressure 90/74 (81), doppler 100.  Dr. Blossom Hoops paged.  12 lead attempted but d/t interference with LVAD, was unable to capture before patient converted to his usual AV paced rhythm at 2307.  Dr. Blossom Hoops came to bedside to assess patient.  Will monitor patient for now and obtain BMP and magnesium level.  ?

## 2021-12-03 NOTE — Progress Notes (Signed)
LVAD Coordinator Rounding Note: ?  ?Admitted 12/01/21 to Dr. Haroldine Laws service for heparin bridge for scheduled right total knee arthroplasty. ?  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 Darcey [name] under bridge to transplant criteria. Patient was removed from transplant list 05/2016 per his choice. He is currently Destination Therapy LVAD.  ?  ?Pt standing at sink washing up and brushing his teeth. Denies complaints this morning. Reports he is nervous for surgery this afternoon, but is looking forward to having it completed. VAD coordinator will accompany pt to OR. ?  ?Vital signs: ?Temp: 97.6 ?HR:   80 AV paced with occasional PVCs ?Doppler Pressure: 108 ?Automatic BP: 117/101 (107) ?O2 Sat:  97% on RA ?Wt: 156.7>157.4>164 lbs   ?  ?  ?LVAD interrogation reveals:  ?Speed:  9600 ?Flow:  3.8 ?Power:  5.4w ?PI: 6.8 ?Alarms: none ?Events:  10 - 20 PI events daily ?  ?Fixed speed:  9600 ?Low speed limit: 9000 ?  ?Drive Line:  Drive line is being maintained weekly by [name], his wife. Existing VAD dressing C/D/I Sorbaview dressing and Silverlon patch intact. Exit site healed and incorporated, the velour is fully implanted at exit site. Pt denies fever or chills. Weekly dressing changes per [facility] nurse or caregiver. Next dressing change due 12/07/21. ?  ?Labs:  ?LDH trend: 249>282>172 ?  ?INR trend:  1.4>1.3>1.3 ?  ?Anticoagulation Plan: ?-INR Goal:  2.5 - 3.0 ?-ASA Dose: 81 mg daily ?  ?  ?Device: Medtronic dual ?- Therapies: on 231 bpm ?- Pacing:  AAI<=>DDD 80 bpm ?  ?Gtts:  ?- Heparin 950 units/hr--off ?  ?  ?Plan/Recommendations:  ?Call VAD Coordinator with any VAD equipment or drive line issues. ?VAD Coordinator will accompany and remain with patient for surgical case today ?  ?Emerson Monte RN ?VAD Coordinator  ?Office: [phone]  ?24/7 Pager: [phone]  ? ?

## 2021-12-03 NOTE — Progress Notes (Signed)
PT Cancellation Note ? ?Patient Details ?Name: Johnny Oneill ?MRN: [id number] ?DOB: [birth year] ? ? ?Cancelled Treatment:    Reason Eval/Treat Not Completed: Other (comment) ? ?Pt s/p R TKA today; however, pt with VAD.  Will f/u tomorrow with VAD trained therapist.  ? ?Abran Richard, PT ?Acute Rehab Services ?Pager [phone] ?[facility] [phone] ? ? ?Johnny Oneill ?12/03/2021, 4:48 PM ?

## 2021-12-03 NOTE — Plan of Care (Signed)
Johnny Oneill ?Nov 28, 1940 ?[id number] ? ?Seen at bedside after he developed stable ventricular arrhythmia this evening.  Reviewed telemetry, monomorphic ventricular rhythm rate 160s, no device therapies, terminated spontaneously.  Reviewed with patient; he was asymptomatic.  Some low flows on LVAD history yesterday but stable parameters without alarms or events since surgery.  Looks well at bedside.   ? ?Will check electrolytes and monitor for now.  Suspect sympathetically mediated post anesthesia.  PVC burden is low.  No chest pain.  If PVC burden escalates could augment antiarrhythmics with IV amiodarone course (I understand he has fared poorly with it in the past). ? ?C. Albertha Ghee, MD ?Cardiology Fellow ?[facility] ? ? ? ?

## 2021-12-03 NOTE — Anesthesia Procedure Notes (Signed)
Anesthesia Regional Block: Adductor canal block  ? ?Pre-Anesthetic Checklist: , timeout performed,  Correct Patient, Correct Site, Correct Laterality,  Correct Procedure, Correct Position, site marked,  Risks and benefits discussed,  Surgical consent,  Pre-op evaluation,  At surgeon's request and post-op pain management ? ?Laterality: Right and Lower ? ?Prep: chloraprep     ?  ?Needles:  ?Injection technique: Single-shot ? ?  ? ? ?Needle Length: 9cm  ?Needle Gauge: 22  ? ? ? ?Additional Needles: ?Arrow? StimuQuik? ECHO Echogenic Stimulating PNB Needle ? ?Procedures:,,,, ultrasound used (permanent image in chart),,    ?Narrative:  ?Start time: 12/03/2021 12:36 PM ?End time: 12/03/2021 12:41 PM ?Injection made incrementally with aspirations every 5 mL. ? ?Performed by: Personally  ?Anesthesiologist: Oleta Mouse, MD ? ? ? ? ?

## 2021-12-03 NOTE — Transfer of Care (Signed)
Immediate Anesthesia Transfer of Care Note ? ?Patient: Johnny Oneill ? ?Procedure(s) Performed: TOTAL KNEE ARTHROPLASTY (Right: Knee) ? ?Patient Location: PACU ? ?Anesthesia Type:General ? ?Level of Consciousness: drowsy, patient cooperative and responds to stimulation ? ?Airway & Oxygen Therapy: Patient Spontanous Breathing and Patient connected to face mask oxygen ? ?Post-op Assessment: Report given to RN, Post -op Vital signs reviewed and stable and Patient moving all extremities X 4 ? ?Post vital signs: Reviewed and stable ? ?Last Vitals:  ?Vitals Value Taken Time  ?BP 116/97 12/03/21 1515  ?Temp    ?Pulse 79 12/03/21 1520  ?Resp 21 12/03/21 1520  ?SpO2 91 % 12/03/21 1520  ?Vitals shown include unvalidated device data. ? ?Last Pain:  ?Vitals:  ? 12/03/21 1142  ?TempSrc:   ?PainSc: 0-No pain  ?   ? ?Patients Stated Pain Goal: 3 (12/03/21 1142) ? ?Complications: No notable events documented. ?

## 2021-12-03 NOTE — Anesthesia Procedure Notes (Addendum)
Procedure Name: Intubation ?Date/Time: 12/03/2021 1:35 PM ?Performed by: Rande Brunt, CRNA ?Pre-anesthesia Checklist: Patient identified, Emergency Drugs available, Suction available and Patient being monitored ?Patient Re-evaluated:Patient Re-evaluated prior to induction ?Oxygen Delivery Method: Circle System Utilized ?Preoxygenation: Pre-oxygenation with 100% oxygen ?Induction Type: IV induction ?Ventilation: Mask ventilation without difficulty ?Laryngoscope Size: Sabra Heck and 2 ?Grade View: Grade I ?Tube type: Oral ?Tube size: 7.5 mm ?Number of attempts: 1 ?Airway Equipment and Method: Stylet and Oral airway ?Placement Confirmation: ETT inserted through vocal cords under direct vision, positive ETCO2 and breath sounds checked- equal and bilateral ?Secured at: 22 cm ?Tube secured with: Tape ?Dental Injury: Teeth and Oropharynx as per pre-operative assessment and Injury to lip  ?Comments: Attempted by A. Levelle Edelen with grade 3 view with MAC 4. Dr. Ermalene Postin attempted with MAC 3 with grade 3 view, with Sabra Heck 2 grade 1 view and ETT placed, knick on upper L lip, neo applied on gauze ? ? ? ? ?

## 2021-12-03 NOTE — Progress Notes (Addendum)
?Advanced Heart Failure VAD Team Note ? ?PCP-Cardiologist: None  ? ?Subjective:   ?Admit for heparin bridge prior to R TKR  ? ?INR 1.3. On heparin drip-->stopping now  ? ?No complaints.   ? ? ?LVAD INTERROGATION:  ?HeartMate II LVAD:   ?Flow 3.8 liters/min, speed 9600, power 5 PI 6,8 40 PI events.  ? ?Objective:   ? ?Vital Signs:   ?Temp:  [97.6 ?F (36.4 ?C)-97.9 ?F (36.6 ?C)] 97.6 ?F (36.4 ?C) (04/05 1245) ?Pulse Rate:  [68-81] 68 (04/05 0731) ?Resp:  [15-18] 17 (04/05 0731) ?BP: (108-126)/(93-103) 117/101 (04/05 0731) ?SpO2:  [94 %-97 %] 97 % (04/05 0731) ?Weight:  [71.9 kg] 71.9 kg (04/05 0356) ?  ?Mean arterial Pressure 100s ? Watch  ?Intake/Output:  ? ?Intake/Output Summary (Last 24 hours) at 12/03/2021 0949 ?Last data filed at 12/03/2021 8099 ?Gross per 24 hour  ?Intake 1582.15 ml  ?Output 775 ml  ?Net 807.15 ml  ?  ? ?Physical Exam  ? General:  No resp difficulty ?HEENT: normal ?Neck: supple. JVP flat . Carotids 2+ bilat; no bruits. No lymphadenopathy or thyromegaly appreciated. ?Cor: Mechanical heart sounds with LVAD hum present. ?Lungs: clear ?Abdomen: soft, nontender, nondistended. No hepatosplenomegaly. No bruits or masses. Good bowel sounds. ?Driveline: C/D/I; securement device intact and driveline incorporated ?Extremities: no cyanosis, clubbing, rash, edema ?Neuro: alert & orientedx3, cranial nerves grossly intact. moves all 4 extremities w/o difficulty. Affect pleasant ? ? ?Telemetry  ?A V paced 60s ?EKG  ?  ?N/A ?Labs  ? ?Basic Metabolic Panel: ?Recent Labs  ?Lab 12/01/21 ?1646 12/02/21 ?8338 12/03/21 ?0056  ?NA 136 139 137  ?K 5.1 4.4 3.7  ?CL 107 109 108  ?CO2 23 22 22   ?GLUCOSE 106* 99 214*  ?BUN 41* 39* 35*  ?CREATININE 2.49* 2.48* 2.33*  ?CALCIUM 8.9 9.2 8.9  ?MG 1.9  --   --   ? ? ?Liver Function Tests: ?No results for input(s): AST, ALT, ALKPHOS, BILITOT, PROT, ALBUMIN in the last 168 hours. ?No results for input(s): LIPASE, AMYLASE in the last 168 hours. ?No results for input(s): AMMONIA in the  last 168 hours. ? ?CBC: ?Recent Labs  ?Lab 12/01/21 ?1646 12/02/21 ?2505 12/03/21 ?0056  ?WBC 5.5 5.2 4.8  ?HGB 10.2* 10.5* 10.0*  ?HCT 30.9* 32.2* 30.4*  ?MCV 91.4 92.3 91.8  ?PLT 170 155 157  ? ? ?INR: ?Recent Labs  ?Lab 11/26/21 ?1115 12/01/21 ?1646 12/02/21 ?3976 12/03/21 ?0056  ?INR 2.7* 1.4* 1.3* 1.3*  ? ? ?Other results: ?EKG:  ? ? ?Imaging  ? ?No results found. ? ? ?Medications:   ? ? ?Scheduled Medications: ? acetaminophen  1,000 mg Oral Once  ? aspirin EC  81 mg Oral q AM  ? bupivacaine liposome  20 mL Other Once  ? citalopram  10 mg Oral Daily  ? levothyroxine  175 mcg Oral Q0600  ? mexiletine  150 mg Oral BID  ? pantoprazole  40 mg Oral Daily  ? rosuvastatin  20 mg Oral QPM  ? sodium chloride flush  3 mL Intravenous Q12H  ? sotalol  80 mg Oral Daily  ? ? ?Infusions: ? sodium chloride    ?  ceFAZolin (ANCEF) IV    ? lactated ringers 75 mL/hr at 12/02/21 2300  ? ? ?PRN Medications: ?sodium chloride, acetaminophen, magnesium hydroxide, melatonin, ondansetron (ZOFRAN) IV ? ? ?Patient Profile  ?Johnny Oneill is a 62 year with a h/o CAD, chronic HFrEF with HMII LVAD, VT, A flutter, hypothyroidism, HTN, CKD Stage IV, and  OA. 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  ? ?Scheduled admit for TKR ? ? ?Assessment/Plan:   ?1)  Osteoarthritis ?- Plan for TKR 4/5 ?- On heparin drip.  Stop for OR later this morning.  ?- Started on LR last night. Continue.  ?  ?2) Chronic systolic HF: ICM, EF 55-73% s/p LVAD HM II implant 08/2013 for DT.   ?- Echo 3/18 EF 10-15% mild AI. Mod RV dysfunction. LVAD cannula OK ?- has struggled with RV failure however RHC 11/22 looked good (despite evidence of RVF on LE dopplers showing reflux ?- Volume status stable. Typically does not need diuretics.  ?- Remains off all diuretics including spiro ?- Failed Jardiance due to volume depletion ?- He has not tolerated ACE-I or amlodipine or b-blocker in the past. ?  ?3) LVAD: HMII 08/2013 for DT. ?-  Admitted in May 2018 with elevated  LDH with bival.  ?- Continue aspirin . Last dose of coumadin 3/31 ?- Off coumadin for TKR. Stop heparin drip.   ?- INR 1.3.  ?- LDH 282>172  ?  ?4) VT/Frequent PVCs ?- now s/p ICD gen change.  ?- Off amio due to cerebellar side effects.  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 Now on sotalol+ mexiletine .  ?- Seen in ER 06/16/21 with recurrent VT. DC-CV x 1 ?- Keep K > 4.0 Mg > 2.0 ?- Recent syncopal event but no arrhythmias on ICD interrogation ?  ?5) Atrial flutter, recurrent ?- patient paced out of AFL in 6/22. Atrial therapies enabled  ?- continue sotalol (on for VT). QTs slightly prolonged but stable ?- may need to consider ablation if recurs ?  ?6) Chronic anticoagulation: INR goal 2.0-3.0.    ?- INR 1.3.  ?-Holding coumadin for TKR. Continue heparin drip.  ?-Continue ASA 81 mg for LVAD  ?  ?7) HTN:  ?- MAPs 100s. ? Anxiety. Watch  ?  ?8) Hypothyroidism:  ?- Followed by Dr Dwyane Dee.  ?- Continue levothyroxine.  ?  ?9) Hyperlipidemia:  ?- Continue Crestor. Zetia stopped due to cost.  ?  ?10) CAD ?- No chest pain.  ?- continue Crestor ?  ?11) CKD IV ?- Creatinine baseline 2.3 - 2.7 ?- Creatinine 2.4 >2.3 today.  ?- failed Jardiance ? ? ?I reviewed the LVAD parameters from today, and compared the results to the patient's prior recorded data.  No programming changes were made.  The LVAD is functioning within specified parameters.  The patient performs LVAD self-test daily.  LVAD interrogation was negative for any significant power changes, alarms or PI events/speed drops.  LVAD equipment check completed and is in good working order.  Back-up equipment present.   LVAD education done on emergency procedures and precautions and reviewed exit site care. ? ?Length of Stay: 2 ? ?Darrick Grinder, NP ?12/03/2021, 9:49 AM ? ?VAD Team --- VAD ISSUES ONLY--- ?Pager [phone] (7am - 7am) ? ?Advanced Heart Failure Team  ?Pager [phone] (M-F; 7a - 5p)  ?Please contact [facility] for night-coverage after hours (5p -7a ) and weekends on  [email or web address] ? ?Patient seen and examined with the above-signed Advanced Practice Provider and/or Housestaff. I personally reviewed laboratory data, imaging studies and relevant notes. I independently examined the patient and formulated the important aspects of the plan. I have edited the note to reflect any of my changes or salient points. I have personally discussed the plan with the patient and/or family. ? ? ?Received IVF overnight. SCr slightly better. On heparin. No bleeding. LDH improved.  ? ?  Denies CP or SOB.  ? ?General:  NAD.  ?HEENT: normal  ?Neck: supple. JVP not elevated.  Carotids 2+ bilat; no bruits. No lymphadenopathy or thryomegaly appreciated. ?Cor: LVAD hum.  ?Lungs: Clear. ?Abdomen: soft, nontender, non-distended. No hepatosplenomegaly. No bruits or masses. Good bowel sounds. Driveline site clean. Anchor in place.  ?Extremities: no cyanosis, clubbing, rash. Warm no edema  ?Neuro: alert & oriented x 3. No focal deficits. Moves all 4 without problem  ? ?Remains on heparin and IVF pre-op. Renal function improved. LDH and VAD parameters stable. For OR today. Consider arterial line for close hemodynamic monitoring. ? ?Glori Bickers, MD  ?12:35 PM ? ? ?

## 2021-12-03 NOTE — Progress Notes (Signed)
PHARMACY NOTE:  ANTIMICROBIAL RENAL DOSAGE ADJUSTMENT ? ?Current antimicrobial regimen includes a mismatch between antimicrobial dosage and estimated renal function.  As per policy approved by the Pharmacy & Therapeutics and Medical Executive Committees, the antimicrobial dosage will be adjusted accordingly. ? ?Current antimicrobial dosage:  Ancef 1gm IV Q6H x 2 doses ? ?Indication: surgical prophylaxis ? ?Renal Function: ? ?Estimated Creatinine Clearance: 24.4 mL/min (A) (by C-G formula based on SCr of 2.33 mg/dL (H)). ?[]      On intermittent HD, scheduled: ?[]      On CRRT ?   ?Antimicrobial dosage has been changed to:  Ancef 1gm IV Q12H x 1 dose ? ?Makale Pindell D. Mina Marble, PharmD, BCPS, BCCCP ?12/03/2021, 4:47 PM ? ?

## 2021-12-03 NOTE — Anesthesia Procedure Notes (Addendum)
Procedure Name: LMA Insertion ?Date/Time: 12/03/2021 1:09 PM ?Performed by: Rande Brunt, CRNA ?Pre-anesthesia Checklist: Patient identified, Emergency Drugs available, Suction available and Patient being monitored ?Patient Re-evaluated:Patient Re-evaluated prior to induction ?Oxygen Delivery Method: Circle System Utilized ?Preoxygenation: Pre-oxygenation with 100% oxygen ?Induction Type: IV induction ?Ventilation: Mask ventilation without difficulty ?LMA: LMA inserted ?LMA Size: 4.0 ?Number of attempts: 1 ?Placement Confirmation: positive ETCO2 ?Tube secured with: Tape ?Dental Injury: Teeth and Oropharynx as per pre-operative assessment  ? ? ? ? ?

## 2021-12-03 NOTE — Interval H&P Note (Signed)
History and Physical Interval Note: ? ?12/03/2021 ?12:31 PM ? ?Johnny Oneill  has presented today for surgery, with the diagnosis of OSTEOARTHRITIS  RIGHT KNEE.  The various methods of treatment have been discussed with the patient and family. After consideration of risks, benefits and other options for treatment, the patient has consented to  Procedure(s): ?TOTAL KNEE ARTHROPLASTY (Right) as a surgical intervention.  The patient's history has been reviewed, patient examined, no change in status, stable for surgery.  I have reviewed the patient's chart and labs.  Questions were answered to the patient's satisfaction.   ? ? ?W D [name] ? ? ?

## 2021-12-03 NOTE — Progress Notes (Signed)
Orthopedic Tech Progress Note ?Patient Details:  ?Johnny Oneill ?Jul 07, 1941 ?[id number] ? ?CPM Right Knee ?CPM Right Knee: On ?Right Knee Flexion (Degrees): 0 ?Right Knee Extension (Degrees): 60 ? ?Post Interventions ?Patient Tolerated: Well ?Instructions Provided: Care of device ?Ortho Devices ?Type of Ortho Device: Bone foam zero knee ?Ortho Device/Splint Interventions: Ordered, Application, Adjustment ?  ?Post Interventions ?Patient Tolerated: Well ?Instructions Provided: Care of device ? ?Janit Pagan ?12/03/2021, 6:52 PM ? ?

## 2021-12-03 NOTE — Progress Notes (Signed)
VAD Coordinator Procedure Note:  ? ?VAD Coordinator met patient in OR. Pt undergoing right total knee arthroplasty per Dr. French Ana. Hemodynamics and VAD parameters monitored by myself and anesthesia throughout the procedure. Blood pressures were obtained with automatic cuff on left arm. Received albumin x 2.  ? ? ? Time: Doppler Auto  ?BP Flow PI Power Speed  ?Pre-procedure:  1300  129/119 (126) 3.4 6.7 5.1 9600  ?         ?         ?Sedation Induction: 1310   3.2 6.2 4.9 9600  ? 1315  102/89 (95) 3.5 4.2 5.2 9600  ? 1330  129/100 (110) --- 4.8 3.7 9600  ? 1345  131/109 (117) 3.3 6.5 5.0 9600  ? 1400  136/113 (122) 3.0 6.1 4.7 9600  ? 1415   133/109 (118) 3.0 6.0 4.8 9600  ? 1430  125/108 (115) 3.7 6.2 5.3 9600  ? 1445  117/104 (110) 3.8 3.3 5.2 9600  ? 1500  100/74 (79) 3.6 5.8 5.0 9600  ?         ?Recovery Area: 1515  116/97 (105) 4.2 7.1 5.5 9600  ? 1530  116/103 (109) 4.2 7.3 5.5 9600  ? 1545  115/97 (104) 3.8 6.8 5.4 9600  ? 1600  113/91 (99) 4.1 6.8 5.4 9600  ?         ? ? ?Patient Disposition: Patient tolerated the procedure well. VAD Coordinator accompanied and remained with patient in recovery area. Transported pt back to 2C02. Dr French Ana to discuss anticoagulation plan with Dr Haroldine Laws.  ? ?Emerson Monte RN ?VAD Coordinator  ?Office: [phone]  ?24/7 Pager: [phone]  ? ?

## 2021-12-03 NOTE — Brief Op Note (Signed)
12/03/2021 ? ?3:17 PM ? ?PATIENT:  Johnny Oneill  81 y.o. male ? ?PRE-OPERATIVE DIAGNOSIS:  OSTEOARTHRITIS  RIGHT KNEE ? ?POST-OPERATIVE DIAGNOSIS:  OSTEOARTHRITIS  RIGHT KNEE ? ?PROCEDURE:  Procedure(s): ?TOTAL KNEE ARTHROPLASTY (Right) ? ?SURGEON:  Surgeon(s) and Role: ?   Earlie Server, MD - Primary ?   Willaim Sheng, MD - Assisting ? ?PHYSICIAN ASSISTANT: Chriss Czar, PA-C ? ?ASSISTANTS: Marchwiany, Osei Anger  ? ?ANESTHESIA:   local, regional, and general ? ?EBL:  50 mL  ? ?BLOOD ADMINISTERED:none ? ?DRAINS: none  ? ?LOCAL MEDICATIONS USED:  MARCAINE    ? ?SPECIMEN:  No Specimen ? ?DISPOSITION OF SPECIMEN:  N/A ? ?COUNTS:  YES ? ?TOURNIQUET:   ?Total Tourniquet Time Documented: ?Thigh (Right) - 54 minutes ?Total: Thigh (Right) - 54 minutes ? ? ?DICTATION: .Other Dictation: Dictation Number unknown ? ?PLAN OF CARE:  already has admission order ? ?PATIENT DISPOSITION:  PACU - hemodynamically stable. ?  ?Delay start of Pharmacological VTE agent (>24hrs) due to surgical blood loss or risk of bleeding: yes ? ?

## 2021-12-03 NOTE — Anesthesia Postprocedure Evaluation (Signed)
Anesthesia Post Note ? ?Patient: Johnny Oneill ? ?Procedure(s) Performed: TOTAL KNEE ARTHROPLASTY (Right: Knee) ? ?  ? ?Patient location during evaluation: SICU ?Anesthesia Type: General and Regional ?Level of consciousness: awake and patient cooperative ?Pain management: pain level controlled ?Vital Signs Assessment: post-procedure vital signs reviewed and stable ?Respiratory status: spontaneous breathing, respiratory function stable and nonlabored ventilation ?Cardiovascular status: stable ?Postop Assessment: no apparent nausea or vomiting ?Anesthetic complications: no ? ? ?No notable events documented. ? ?Last Vitals:  ?Vitals:  ? 12/03/21 1600 12/03/21 1623  ?BP: (!) 113/91 (!) 111/92  ?Pulse: 80 80  ?Resp: 13 19  ?Temp:  36.8 ?C  ?SpO2: 100% 96%  ?  ?Last Pain:  ?Vitals:  ? 12/03/21 1623  ?TempSrc: Oral  ?PainSc: 0-No pain  ? ? ?  ?  ?  ?  ?  ?  ? ?Rissie Sculley ? ? ? ? ?

## 2021-12-03 NOTE — Anesthesia Preprocedure Evaluation (Addendum)
Anesthesia Evaluation  ?Patient identified by MRN, date of birth, ID band ?Patient awake ? ? ? ?Reviewed: ?Allergy & Precautions, NPO status , Patient's Chart, lab work & pertinent test results ? ?History of Anesthesia Complications ?Negative for: history of anesthetic complications ? ?Airway ?Mallampati: II ? ?TM Distance: >3 FB ?Neck ROM: Limited ? ? ? Dental ? ?(+) Teeth Intact, Dental Advisory Given ?  ?Pulmonary ?neg shortness of breath, neg sleep apnea, neg COPD, neg recent URI,  ?  ?breath sounds clear to auscultation ? ? ? ? ? ? Cardiovascular ?hypertension, + CAD, + Past MI and +CHF  ?+ dysrhythmias + pacemaker + Cardiac Defibrillator ? ?Rhythm:Regular  ?1. LVAD inflow cannula visualized at the apex. Left ventricular ejection  ?fraction, by estimation, is <20%. The left ventricle has severely  ?decreased function. The left ventricle demonstrates regional wall motion  ?abnormalities (see scoring  ?diagram/findings for description). The left ventricular internal cavity  ?size was mildly dilated. Left ventricular diastolic parameters are  ?indeterminate. Global hypokinesis with regional variation - particularly  ?inferior and lateral akinesis.  ??2. Right ventricular systolic function is moderately reduced. The right  ?ventricular size is mildly enlarged.  ??3. Left atrial size was mildly dilated.  ??4. The mitral valve is abnormal. Mild mitral valve regurgitation.  ??5. The aortic valve does not appear to open. The aortic valve has an  ?indeterminant number of cusps. Aortic valve regurgitation is mild to  ?moderate and the regurgitation is continuous.  ?  ?Neuro/Psych ?PSYCHIATRIC DISORDERS Anxiety negative neurological ROS ?   ? GI/Hepatic ?negative GI ROS, Neg liver ROS,   ?Endo/Other  ?Hypothyroidism  ? Renal/GU ?CRFRenal diseaseLab Results ?     Component                Value               Date                 ?     K                        3.7                 12/03/2021            ?Lab Results ?     Component                Value               Date                 ?     CREATININE               2.33 (H)            12/03/2021           ?  ? ?  ?Musculoskeletal ? ? Abdominal ?  ?Peds ? Hematology ? ?(+) Blood dyscrasia, anemia , Lab Results ?     Component                Value               Date                 ?     WBC                      4.8  12/03/2021           ?     HGB                      10.0 (L)            12/03/2021           ?     HCT                      30.4 (L)            12/03/2021           ?     MCV                      91.8                12/03/2021           ?     PLT                      157                 12/03/2021           ?   ?Anesthesia Other Findings ?VAD hum ? Reproductive/Obstetrics ? ?  ? ? ? ? ? ? ? ? ? ? ? ? ? ?  ?  ? ? ? ? ? ? ? ?Anesthesia Physical ?Anesthesia Plan ? ?ASA: 4 ? ?Anesthesia Plan: General and Regional  ? ?Post-op Pain Management: Regional block*, Ofirmev IV (intra-op)* and Ketamine IV*  ? ?Induction: Intravenous ? ?PONV Risk Score and Plan: 2 and Ondansetron and Dexamethasone ? ?Airway Management Planned: Oral ETT and LMA ? ?Additional Equipment:  ? ?Intra-op Plan:  ? ?Post-operative Plan: Extubation in OR ? ?Informed Consent: I have reviewed the patients History and Physical, chart, labs and discussed the procedure including the risks, benefits and alternatives for the proposed anesthesia with the patient or authorized representative who has indicated his/her understanding and acceptance.  ? ? ? ?Dental advisory given ? ?Plan Discussed with: CRNA and Surgeon ? ?Anesthesia Plan Comments:   ? ? ? ? ? ? ?Anesthesia Quick Evaluation ? ?

## 2021-12-03 NOTE — Discharge Instructions (Addendum)

## 2021-12-03 NOTE — Progress Notes (Signed)
Orthopedic Tech Progress Note ?Patient Details:  ?Johnny Oneill ?09-Apr-1941 ?[id number] ? ?CPM Right Knee ?CPM Right Knee: Off ?Right Knee Flexion (Degrees): 0 ?Right Knee Extension (Degrees): 60 ? ?Post Interventions ?Patient Tolerated: Well ?Instructions Provided: Care of device ? ?Vernona Rieger ?12/03/2021, 9:29 PM ? ?

## 2021-12-04 ENCOUNTER — Ambulatory Visit: Payer: Medicare Other | Admitting: Endocrinology

## 2021-12-04 ENCOUNTER — Encounter (HOSPITAL_COMMUNITY): Payer: Self-pay | Admitting: Orthopedic Surgery

## 2021-12-04 DIAGNOSIS — Z95811 Presence of heart assist device: Secondary | ICD-10-CM | POA: Diagnosis not present

## 2021-12-04 DIAGNOSIS — I5022 Chronic systolic (congestive) heart failure: Secondary | ICD-10-CM | POA: Diagnosis not present

## 2021-12-04 DIAGNOSIS — M1711 Unilateral primary osteoarthritis, right knee: Secondary | ICD-10-CM | POA: Diagnosis not present

## 2021-12-04 LAB — LACTATE DEHYDROGENASE: LDH: 200 U/L — ABNORMAL HIGH (ref 98–192)

## 2021-12-04 LAB — MAGNESIUM: Magnesium: 1.7 mg/dL (ref 1.7–2.4)

## 2021-12-04 LAB — CBC
HCT: 28.3 % — ABNORMAL LOW (ref 39.0–52.0)
Hemoglobin: 9.3 g/dL — ABNORMAL LOW (ref 13.0–17.0)
MCH: 30.4 pg (ref 26.0–34.0)
MCHC: 32.9 g/dL (ref 30.0–36.0)
MCV: 92.5 fL (ref 80.0–100.0)
Platelets: 127 10*3/uL — ABNORMAL LOW (ref 150–400)
RBC: 3.06 MIL/uL — ABNORMAL LOW (ref 4.22–5.81)
RDW: 15 % (ref 11.5–15.5)
WBC: 13.7 10*3/uL — ABNORMAL HIGH (ref 4.0–10.5)
nRBC: 0 % (ref 0.0–0.2)

## 2021-12-04 LAB — BASIC METABOLIC PANEL
Anion gap: 6 (ref 5–15)
BUN: 29 mg/dL — ABNORMAL HIGH (ref 8–23)
CO2: 22 mmol/L (ref 22–32)
Calcium: 8.7 mg/dL — ABNORMAL LOW (ref 8.9–10.3)
Chloride: 108 mmol/L (ref 98–111)
Creatinine, Ser: 2.21 mg/dL — ABNORMAL HIGH (ref 0.61–1.24)
GFR, Estimated: 30 mL/min — ABNORMAL LOW (ref >60)
Glucose, Bld: 150 mg/dL — ABNORMAL HIGH (ref 70–99)
Potassium: 4.7 mmol/L (ref 3.5–5.1)
Sodium: 136 mmol/L (ref 135–145)

## 2021-12-04 LAB — HEPARIN LEVEL (UNFRACTIONATED)
Heparin Unfractionated: <0.1 IU/mL — ABNORMAL LOW (ref 0.30–0.70)
Heparin Unfractionated: 0.16 IU/mL — ABNORMAL LOW (ref 0.30–0.70)

## 2021-12-04 LAB — PROTIME-INR
INR: 1.2 (ref 0.8–1.2)
Prothrombin Time: 15.5 seconds — ABNORMAL HIGH (ref 11.4–15.2)

## 2021-12-04 MED ORDER — WARFARIN SODIUM 5 MG PO TABS
5.0000 mg | ORAL_TABLET | Freq: Once | ORAL | Status: AC
  Administered 2021-12-04: 5 mg via ORAL
  Filled 2021-12-04: qty 1

## 2021-12-04 MED ORDER — MAGNESIUM SULFATE 4 GM/100ML IV SOLN
4.0000 g | Freq: Once | INTRAVENOUS | Status: DC
Start: 2021-12-04 — End: 2021-12-04
  Filled 2021-12-04: qty 100

## 2021-12-04 MED ORDER — HEPARIN (PORCINE) 25000 UT/250ML-% IV SOLN
1200.0000 [IU]/h | INTRAVENOUS | Status: DC
  Administered 2021-12-04 – 2021-12-05 (×2): 800 [IU]/h via INTRAVENOUS
  Administered 2021-12-06: 1100 [IU]/h via INTRAVENOUS
  Administered 2021-12-07 – 2021-12-10 (×4): 1200 [IU]/h via INTRAVENOUS
  Filled 2021-12-04 (×6): qty 250

## 2021-12-04 MED ORDER — MAGNESIUM SULFATE 2 GM/50ML IV SOLN
2.0000 g | Freq: Once | INTRAVENOUS | Status: AC
  Administered 2021-12-04: 2 g via INTRAVENOUS
  Filled 2021-12-04: qty 50

## 2021-12-04 MED ORDER — WARFARIN - PHARMACIST DOSING INPATIENT: Freq: Every day | Status: DC

## 2021-12-04 MED ORDER — MIDODRINE HCL 5 MG PO TABS
5.0000 mg | ORAL_TABLET | Freq: Three times a day (TID) | ORAL | Status: DC
  Administered 2021-12-04 – 2021-12-06 (×5): 5 mg via ORAL
  Filled 2021-12-04 (×5): qty 1

## 2021-12-04 NOTE — Progress Notes (Signed)
?   12/04/21 2005  ?Assess: MEWS Score  ?Temp 97.7 ?F (36.5 ?C)  ?BP (!) 75/63  ?Pulse Rate 76  ?ECG Heart Rate 80  ?Resp 14  ?Level of Consciousness Alert  ?SpO2 95 %  ?O2 Device Room Air  ?Assess: MEWS Score  ?MEWS Temp 0  ?MEWS Systolic 2  ?MEWS Pulse 0  ?MEWS RR 0  ?MEWS LOC 0  ?MEWS Score 2  ?MEWS Score Color Yellow  ?Assess: if the MEWS score is Yellow or Red  ?Were vital signs taken at a resting state? Yes  ?Focused Assessment No change from prior assessment  ?Early Detection of Sepsis Score *See Row Information* Low  ?MEWS guidelines implemented *See Row Information* No, other (Comment) ?(LVAD pt/coordinator paged over doppler)  ?Treat  ?Pain Scale 0-10  ?Pain Score 0  ?Notify: Provider  ?Provider Name/Title Molly/Dr. Haroldine Laws  ?Date Provider Notified 12/04/21  ?Time Provider Notified 2014  ?Notification Type Call  ?Notification Reason Change in status  ?Provider response See new orders  ?Date of Provider Response 12/04/21  ?Time of Provider Response 2020  ?Document  ?Patient Outcome Other (Comment) ?(Stable-remains on department/will monitor patient on new medication)  ?Progress note created (see row info) Yes  ? ? ?

## 2021-12-04 NOTE — Discharge Summary (Addendum)
Advanced Heart Failure Team ? ?Discharge Summary  ? ?Patient ID: Johnny Oneill ?MRN: [id number], DOB/AGE: 79-Sep-1942 79 y.o. Admit date: 12/01/2021 ?D/C date:     12/12/2021  ? 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 ? ?Hospital Course:  ?Johnny Oneill is a 81 year with a h/o CAD, chronic HFrEF with HMII LVAD, VT, A flutter, hypothyroidism, HTN, CKD Stage IV, and OA. Intolerant amio due to tremors /cerebellar side effects.  ? ?Presented to ED 11/24/21 after syncopal episode. Work up negative for arrhythmias. Felt to be in the setting dehydration. Given IVF and later discharged to home.  ?  ?Admitted 12/01/21  for optimization/heparin bridge prior to elective right total knee replacement. Was stable from cardiac perspective at time of admission. INR 1.3, off coumadin. Started on heparin gtt. Taken to OR 4/5 and underwent successful right TKR by Dr. French Oneill. Restarted on heparin drip and coumadin once cleared by orthopedic team. Post operative course complicated by orthostatic hypotension. Placed on midodrine with improvement. Attempted to wean midodrine but developed recurrent orthostasis.  Plan to wean midodrine at his follow up in the VAD clinic.  ? ?Hgb dropped to 7.6. Given 1UPRBCs with appropriate rise. Worked with PT and able to ambulate with a walker. At the time of discharge pain controlled and tolerated regular diet.  From HF/VAD perspective he remained stable.  ? ?He will continue to be followed closely in the VAD clinic. He has outpatient PT/orthopedic follow up.  ? ? LVAD INTERROGATION:  ?HeartMate II LVAD:  Flow 4 liters/min, speed 9600, power 5, PI 7.2  ?Discharge Vitals: Blood pressure 112/89, pulse 80, temperature 98 ?F (36.7 ?C), temperature source  Oral, resp. rate 14, height 5\' 7"  (1.702 m), weight 72.1 kg, SpO2 98 %. ? ?Labs: ?Lab Results  ?Component Value Date  ? WBC 6.9 12/12/2021  ? HGB 9.4 (L) 12/12/2021  ? HCT 28.2 (L) 12/12/2021  ? MCV 91.3 12/12/2021  ? PLT 266 12/12/2021  ?  ?Recent Labs  ?Lab 12/12/21 ?0101  ?NA 134*  ?K 4.3  ?CL 104  ?CO2 25  ?BUN 32*  ?CREATININE 2.08*  ?CALCIUM 8.7*  ?GLUCOSE 110*  ? ?Lab Results  ?Component Value Date  ? CHOL 93 05/28/2015  ? HDL 41.50 05/28/2015  ? [place] 29 05/28/2015  ? TRIG 112.0 05/28/2015  ? ?BNP (last 3 results) ?No results for input(s): BNP in the last 8760 hours. ? ?ProBNP (last 3 results) ?No results for input(s): PROBNP in the last 8760 hours. ? ? ?Diagnostic Studies/Procedures  ? ?No results found. ? ?Discharge Medications  ? ?Allergies as of 12/12/2021   ? ?   Reactions  ? Ace Inhibitors   ? Hypotension and elevated creatinine  ? Amiodarone Other (See Comments)  ? Cerebellar side effects  ? Diovan [valsartan] Other (See Comments)  ? Hypotension at low dose, hyperkalemia  ? Lipitor [atorvastatin Calcium] Other (See Comments)  ? Muscle pain  ? Jardiance [empagliflozin] Other (See Comments)  ? Unsure of exact side effect  ? Norvasc [amlodipine] Other (See Comments)  ? Put patient to sleep  ? ?  ? ?  ?Medication List  ?  ? ?TAKE these medications   ? ?acetaminophen 500 MG tablet ?Commonly known as: TYLENOL ?Take 500 mg by mouth every 6 (six)  hours as needed (pain). ?  ?aspirin EC 81 MG tablet ?Take 81 mg by mouth in the morning. ?  ?cholecalciferol 25 MCG (1000 UNIT) tablet ?Commonly known as: VITAMIN D3 ?Take 1,000 Units by mouth in the morning. ?  ?citalopram 10 MG tablet ?Commonly known as: CELEXA ?Take 1 tablet by mouth once daily ?  ?CoQ10 200 MG Caps ?Take 200 mg by mouth in the morning. ?  ?famotidine 20 MG tablet ?Commonly known as: PEPCID ?Take 20 mg by mouth in the morning. ?  ?furosemide 20 MG tablet ?Commonly known as: LASIX ?Take 1 tablet (20 mg total) by mouth daily as needed (Take 20 mg  as daily as needed for wt > 153 lbs). Reported on 01/14/2016 ?  ?melatonin 5 MG Tabs ?Take 5 mg by mouth at bedtime as needed (sleep). ?  ?mexiletine 150 MG capsule ?Commonly known as: MEXITIL ?Take 1 capsule (150 mg total) by mouth 2 (two) times daily. ?  ?midodrine 10 MG tablet ?Commonly known as: PROAMATINE ?Take 1 tablet (10 mg total) by mouth 3 (three) times daily with meals. ?  ?neomycin-bacitracin-polymyxin ointment ?Commonly known as: NEOSPORIN ?Apply 1 application. topically as needed for wound care. ?  ?oxyCODONE 5 MG immediate release tablet ?Commonly known as: Oxy IR/ROXICODONE ?Take 1 tablet (5 mg total) by mouth every 12 (twelve) hours as needed for breakthrough pain. ?  ?pantoprazole 40 MG tablet ?Commonly known as: PROTONIX ?Take 1 tablet by mouth once daily ?  ?potassium chloride SA 20 MEQ tablet ?Commonly known as: KLOR-CON M ?Take 1 tablet (20 mEq total) by mouth daily as needed. Take one potassium pill when you take your lasix. ?  ?PROBIOTIC PO ?Take 1 capsule by mouth in the morning. ?  ?rosuvastatin 20 MG tablet ?Commonly known as: CRESTOR ?Take 1 tablet (20 mg total) by mouth daily. ?  ?sotalol 80 MG tablet ?Commonly known as: BETAPACE ?TAKE 1 TABLET (80 MG TOTAL) BY MOUTH DAILY. ?  ?Synthroid 175 MCG tablet ?Generic drug: levothyroxine ?1 tablet daily every morning except half tablet on Sundays ?What changed: additional instructions ?  ?traMADol 50 MG tablet ?Commonly known as: ULTRAM ?Take 1-2 tablets (50-100 mg total) by mouth every 6 (six) hours as needed for moderate pain. ?What changed: reasons to take this ?  ?traZODone 50 MG tablet ?Commonly known as: DESYREL ?Take 1 tablet (50 mg total) by mouth at bedtime. ?What changed:  ?when to take this ?reasons to take this ?  ?warfarin 5 MG tablet ?Commonly known as: COUMADIN ?Take as directed. If you are unsure how to take this medication, talk to your nurse or doctor. ?Original instructions: Take 7.5 mg 4/14 then 5 mg (1 tablet) every day as  directed by HF Clinic ?What changed: additional instructions ?  ? ?  ? ?  ?  ? ? ?  ?Durable Medical Equipment  ?(From admission, onward)  ?  ? ? ?  ? ?  Start     Ordered  ? 12/03/21 1642  DME Walker rolling  Once       ?Question Answer Comment  ?Walker: With 5 Inch Wheels   ?Patient needs a walker to treat with the following condition Primary localized osteoarthritis of right knee   ?  ? 12/03/21 1641  ? ?  ?  ? ?  ? ? ?Disposition  ? ?The patient will be discharged in stable condition to home. ?Discharge Instructions   ? ? (HEART FAILURE PATIENTS) Call MD:  Anytime you have any  of the following symptoms: 1) 3 pound weight gain in 24 hours or 5 pounds in 1 week 2) shortness of breath, with or without a dry hacking cough 3) swelling in the hands, feet or stomach 4) if you have to sleep on extra pillows at night in order to breathe.   Complete by: As directed ?  ? Diet - low sodium heart healthy   Complete by: As directed ?  ? Heart Failure patients record your daily weight using the same scale at the same time of day   Complete by: As directed ?  ? INR  Goal: 2 - 2.5   Complete by: As directed ?  ? Goal: 2 - 2.5  ? Increase activity slowly   Complete by: As directed ?  ? [facility] VAD Coordinator at [phone]  Notify for: any VAD alarms, sustained elevations of power >10 watts, sustained drop in Pulse Index <3   Complete by: As directed ?  ? Notify for:  any VAD alarms ?sustained elevations of power >10 watts ?sustained drop in Pulse Index <3  ?  ? Speed Settings:   Complete by: As directed ?  ? Fixed 9600 RPM ?Low 9000 RPM  ? ?  ? ? Follow-up Information   ? ? Earlie Server, MD. Daphane Shepherd on 12/16/2021.   ?Specialty: Orthopedic Surgery ?Why: Your appointment is at 11:45. ?Contact information: ?[address] ?[address] ?[place] [address] ?[phone] ? ? ?  ?  ? ? [facility] Follow up.   ?Specialty: Home Health Services ?Why: HHPT will provide 6 Home visits prior to starting outpatient physical  therapy ?Contact information: ?[address] ?[address] ?[place] [address] ?[phone] ? ? ?  ?  ? ? [employer]eronautical engineer[employer]. Go on 12/16/2021.   ?Why: appointment to be rescheduled. ?Con

## 2021-12-04 NOTE — Progress Notes (Signed)
ANTICOAGULATION CONSULT NOTE ? ?Pharmacy Consult for heparin ?Indication: LVAD ? ?Allergies  ?Allergen Reactions  ? Ace Inhibitors   ?  Hypotension and elevated creatinine  ? Amiodarone Other (See Comments)  ?  Cerebellar side effects  ? Diovan [Valsartan] Other (See Comments)  ?  Hypotension at low dose, hyperkalemia  ? Lipitor [Atorvastatin Calcium] Other (See Comments)  ?  Muscle pain  ? Jardiance [Empagliflozin] Other (See Comments)  ?  Unsure of exact side effect  ? Norvasc [Amlodipine] Other (See Comments)  ?  Put patient to sleep  ? ? ?Patient Measurements: ?Height: 5\' 7"  (170.2 cm) ?Weight: 75.1 kg (165 lb 9.1 oz) ?IBW/kg (Calculated) : 66.1 ? Heparin dosing weight: 71.1 kg ? ?Vital Signs: ?Temp: 98 ?F (36.7 ?C) (04/06 5427) ?Temp Source: Oral (04/06 0737) ?BP: 104/88 (04/06 1026) ?Pulse Rate: 82 (04/06 0827) ? ?Labs: ?Recent Labs  ?  12/02/21 ?0623 12/02/21 ?7628 12/02/21 ?1640 12/03/21 ?0056 12/04/21 ?0137  ?HGB 10.5*  --   --  10.0* 9.3*  ?HCT 32.2*  --   --  30.4* 28.3*  ?PLT 155  --   --  157 127*  ?LABPROT 16.6*  --   --  16.4* 15.5*  ?INR 1.3*  --   --  1.3* 1.2  ?HEPARINUNFRC  --    < > 0.39 0.42 <0.10*  ?CREATININE 2.48*  --   --  2.33* 2.21*  ? < > = values in this interval not displayed.  ? ? ? ?Estimated Creatinine Clearance: 25.8 mL/min (A) (by C-G formula based on SCr of 2.21 mg/dL (H)). ? ? ?Medical History: ?Past Medical History:  ?Diagnosis Date  ? Anxiety   ? Automatic implantable cardioverter-defibrillator in situ   ? CAD (coronary artery disease)   ? S/P stenting of LCX in 2004;  s/p Lat MI 2009 with occlusion of the LCX - treated with Promus stenting. Has total occlusion of the RCA.   ? CHF (congestive heart failure) ([place])   ? Hypercholesteremia   ? Hypertension   ? Hypothyroidism   ? ICD (implantable cardiac defibrillator) in place   ? PROPHYLACTIC      medtronic  ? Inferior MI ([place]) "? date"; 2009  ? Ischemic cardiomyopathy   ? a. 10/09 Echo: Sev LV Dysfxn, inf/lat AK, Mod MR.  ? Liver  disease   ? LVAD (left ventricular assist device) present ([place])   ? Osteoarthritis   ? a. s/p R TKR  ? Pacemaker   ? PVC (premature ventricular contraction)   ? RBBB   ? Shortness of breath   ? ? ?Medications:  ?Medications Prior to Admission  ?Medication Sig Dispense Refill Last Dose  ? acetaminophen (TYLENOL) 500 MG tablet Take 500 mg by mouth every 6 (six) hours as needed (pain).     ? aspirin EC 81 MG tablet Take 81 mg by mouth in the morning.     ? cholecalciferol (VITAMIN D3) 25 MCG (1000 UT) tablet Take 1,000 Units by mouth in the morning.     ? citalopram (CELEXA) 10 MG tablet Take 1 tablet by mouth once daily 90 tablet 3   ? Coenzyme Q10 (COQ10) 200 MG CAPS Take 200 mg by mouth in the morning.     ? famotidine (PEPCID) 20 MG tablet Take 20 mg by mouth in the morning.     ? furosemide (LASIX) 20 MG tablet Take 1 tablet (20 mg total) by mouth daily as needed (Take 20 mg as daily as needed for  wt > 153 lbs). Reported on 01/14/2016 (Patient not taking: Reported on 11/26/2021) 30 tablet 6   ? melatonin 5 MG TABS Take 5 mg by mouth at bedtime as needed (sleep).     ? mexiletine (MEXITIL) 150 MG capsule Take 1 capsule (150 mg total) by mouth 2 (two) times daily. 60 capsule 6   ? neomycin-bacitracin-polymyxin (NEOSPORIN) ointment Apply 1 application. topically as needed for wound care. (Patient not taking: Reported on 11/26/2021)     ? pantoprazole (PROTONIX) 40 MG tablet Take 1 tablet by mouth once daily 90 tablet 3   ? potassium chloride SA (KLOR-CON) 20 MEQ tablet Take 1 tablet (20 mEq total) by mouth daily as needed. Take one potassium pill when you take your lasix. (Patient not taking: Reported on 11/26/2021) 30 tablet 6   ? Probiotic Product (PROBIOTIC PO) Take 1 capsule by mouth in the morning.     ? sotalol (BETAPACE) 80 MG tablet TAKE 1 TABLET (80 MG TOTAL) BY MOUTH DAILY. 90 tablet 3   ? SYNTHROID 175 MCG tablet 1 tablet daily every morning except half tablet on Sundays (Patient taking differently: 1 tablet  daily every morning) 30 tablet 3   ? traMADol (ULTRAM) 50 MG tablet Take 1-2 tablets (50-100 mg total) by mouth every 6 (six) hours as needed for moderate pain. (Patient taking differently: Take 50-100 mg by mouth every 6 (six) hours as needed for moderate pain (knee pain).) 45 tablet 5   ? traZODone (DESYREL) 50 MG tablet Take 1 tablet (50 mg total) by mouth at bedtime. (Patient taking differently: Take 50 mg by mouth at bedtime as needed for sleep.) 30 tablet 5   ? rosuvastatin (CRESTOR) 20 MG tablet Take 1 tablet (20 mg total) by mouth daily. 90 tablet 3   ? warfarin (COUMADIN) 5 MG tablet Take 5 mg (1 tablet) every day OR as directed by HF Clinic 135 tablet 11 11/27/2021  ? ?Scheduled:  ? acetaminophen  1,000 mg Oral Q6H  ? aspirin EC  81 mg Oral q AM  ? citalopram  10 mg Oral Daily  ? docusate sodium  100 mg Oral BID  ? levothyroxine  175 mcg Oral Q0600  ? mexiletine  150 mg Oral BID  ? pantoprazole  40 mg Oral Daily  ? rosuvastatin  20 mg Oral QPM  ? sodium chloride flush  3 mL Intravenous Q12H  ? sotalol  80 mg Oral Daily  ? warfarin  5 mg Oral ONCE-1600  ? Warfarin - Pharmacist Dosing Inpatient   Does not apply M4268  ? ? ?Assessment: ?81 yo male on warfarin PTA for LVAD (placed 08/2013). Plans are for R TKR on 4/5 (last dose of warfarin given 3/30). INR 1.3 today. ?Pharmacy consulted to dose heparin while INR < 2 ? ?Heparin drip stopped preop 4/5  ?S/p TKR ?Per ortho ok to resume low drip rate heparin and warfarin POD1  INR 1.2. ?CBC stable, LDH back to baseline  220>280>170. ? ? ?Goal of Therapy:  ?Heparin level 0.3-0.5 ?INR 2.5-3 (Hx pump thrombosis) ?Monitor platelets by anticoagulation protocol: Yes ?  ?Plan:  ?Resume heparin drip 800 uts/hr - no bolus and no up titration today  ?Warfarin 5mg  x1  ?Daily heparin level, Protime  and CBC ?Monitor s/s bleeding, thrombosis  ? ? ?Bonnita Nasuti Pharm.D. CPP, BCPS ?Clinical Pharmacist ?[phone] ?12/04/2021 11:38 AM  ? ?Please check AMION for all [place]  numbers ?12/04/2021 ? ? ? ? ? ?

## 2021-12-04 NOTE — Progress Notes (Signed)
ANTICOAGULATION CONSULT NOTE ? ?Pharmacy Consult for heparin ?Indication: LVAD ? ?Allergies  ?Allergen Reactions  ? Ace Inhibitors   ?  Hypotension and elevated creatinine  ? Amiodarone Other (See Comments)  ?  Cerebellar side effects  ? Diovan [Valsartan] Other (See Comments)  ?  Hypotension at low dose, hyperkalemia  ? Lipitor [Atorvastatin Calcium] Other (See Comments)  ?  Muscle pain  ? Jardiance [Empagliflozin] Other (See Comments)  ?  Unsure of exact side effect  ? Norvasc [Amlodipine] Other (See Comments)  ?  Put patient to sleep  ? ? ?Patient Measurements: ?Height: 5\' 7"  (170.2 cm) ?Weight: 75.1 kg (165 lb 9.1 oz) ?IBW/kg (Calculated) : 66.1 ? Heparin dosing weight: 71.1 kg ? ?Vital Signs: ?Temp: 97.7 ?F (36.5 ?C) (04/06 2005) ?Temp Source: Oral (04/06 2005) ?BP: 75/63 (04/06 2005) ?Pulse Rate: 76 (04/06 2005) ? ?Labs: ?Recent Labs  ?  12/02/21 ?6144 12/02/21 ?3154 12/03/21 ?0056 12/04/21 ?0137 12/04/21 ?2000  ?HGB 10.5*  --  10.0* 9.3*  --   ?HCT 32.2*  --  30.4* 28.3*  --   ?PLT 155  --  157 127*  --   ?LABPROT 16.6*  --  16.4* 15.5*  --   ?INR 1.3*  --  1.3* 1.2  --   ?HEPARINUNFRC  --    < > 0.42 <0.10* 0.16*  ?CREATININE 2.48*  --  2.33* 2.21*  --   ? < > = values in this interval not displayed.  ? ? ? ?Estimated Creatinine Clearance: 25.8 mL/min (A) (by C-G formula based on SCr of 2.21 mg/dL (H)). ? ? ?Medical History: ?Past Medical History:  ?Diagnosis Date  ? Anxiety   ? Automatic implantable cardioverter-defibrillator in situ   ? CAD (coronary artery disease)   ? S/P stenting of LCX in 2004;  s/p Lat MI 2009 with occlusion of the LCX - treated with Promus stenting. Has total occlusion of the RCA.   ? CHF (congestive heart failure) ([place])   ? Hypercholesteremia   ? Hypertension   ? Hypothyroidism   ? ICD (implantable cardiac defibrillator) in place   ? PROPHYLACTIC      medtronic  ? Inferior MI ([place]) "? date"; 2009  ? Ischemic cardiomyopathy   ? a. 10/09 Echo: Sev LV Dysfxn, inf/lat AK, Mod MR.  ?  Liver disease   ? LVAD (left ventricular assist device) present ([place])   ? Osteoarthritis   ? a. s/p R TKR  ? Pacemaker   ? PVC (premature ventricular contraction)   ? RBBB   ? Shortness of breath   ? ? ?Medications:  ?Medications Prior to Admission  ?Medication Sig Dispense Refill Last Dose  ? acetaminophen (TYLENOL) 500 MG tablet Take 500 mg by mouth every 6 (six) hours as needed (pain).     ? aspirin EC 81 MG tablet Take 81 mg by mouth in the morning.     ? cholecalciferol (VITAMIN D3) 25 MCG (1000 UT) tablet Take 1,000 Units by mouth in the morning.     ? citalopram (CELEXA) 10 MG tablet Take 1 tablet by mouth once daily 90 tablet 3   ? Coenzyme Q10 (COQ10) 200 MG CAPS Take 200 mg by mouth in the morning.     ? famotidine (PEPCID) 20 MG tablet Take 20 mg by mouth in the morning.     ? furosemide (LASIX) 20 MG tablet Take 1 tablet (20 mg total) by mouth daily as needed (Take 20 mg as daily as needed for  wt > 153 lbs). Reported on 01/14/2016 (Patient not taking: Reported on 11/26/2021) 30 tablet 6   ? melatonin 5 MG TABS Take 5 mg by mouth at bedtime as needed (sleep).     ? mexiletine (MEXITIL) 150 MG capsule Take 1 capsule (150 mg total) by mouth 2 (two) times daily. 60 capsule 6   ? neomycin-bacitracin-polymyxin (NEOSPORIN) ointment Apply 1 application. topically as needed for wound care. (Patient not taking: Reported on 11/26/2021)     ? pantoprazole (PROTONIX) 40 MG tablet Take 1 tablet by mouth once daily 90 tablet 3   ? potassium chloride SA (KLOR-CON) 20 MEQ tablet Take 1 tablet (20 mEq total) by mouth daily as needed. Take one potassium pill when you take your lasix. (Patient not taking: Reported on 11/26/2021) 30 tablet 6   ? Probiotic Product (PROBIOTIC PO) Take 1 capsule by mouth in the morning.     ? sotalol (BETAPACE) 80 MG tablet TAKE 1 TABLET (80 MG TOTAL) BY MOUTH DAILY. 90 tablet 3   ? SYNTHROID 175 MCG tablet 1 tablet daily every morning except half tablet on Sundays (Patient taking differently: 1  tablet daily every morning) 30 tablet 3   ? traMADol (ULTRAM) 50 MG tablet Take 1-2 tablets (50-100 mg total) by mouth every 6 (six) hours as needed for moderate pain. (Patient taking differently: Take 50-100 mg by mouth every 6 (six) hours as needed for moderate pain (knee pain).) 45 tablet 5   ? traZODone (DESYREL) 50 MG tablet Take 1 tablet (50 mg total) by mouth at bedtime. (Patient taking differently: Take 50 mg by mouth at bedtime as needed for sleep.) 30 tablet 5   ? rosuvastatin (CRESTOR) 20 MG tablet Take 1 tablet (20 mg total) by mouth daily. 90 tablet 3   ? warfarin (COUMADIN) 5 MG tablet Take 5 mg (1 tablet) every day OR as directed by HF Clinic 135 tablet 11 11/27/2021  ? ?Scheduled:  ? aspirin EC  81 mg Oral q AM  ? citalopram  10 mg Oral Daily  ? docusate sodium  100 mg Oral BID  ? levothyroxine  175 mcg Oral Q0600  ? mexiletine  150 mg Oral BID  ? midodrine  5 mg Oral TID WC  ? pantoprazole  40 mg Oral Daily  ? rosuvastatin  20 mg Oral QPM  ? sodium chloride flush  3 mL Intravenous Q12H  ? sotalol  80 mg Oral Daily  ? Warfarin - Pharmacist Dosing Inpatient   Does not apply K0938  ? ? ?Assessment: ?81 yo male on warfarin PTA for LVAD (placed 08/2013). Plans are for R TKR on 4/5 (last dose of warfarin given 3/30). INR 1.3 today. ?Pharmacy consulted to dose heparin while INR < 2 ? ?Heparin drip stopped preop 4/5  ?S/p TKR ?Per ortho ok to resume low drip rate heparin and warfarin POD1  INR 1.2. ?CBC stable, LDH back to baseline  220>280>170. ? ?Heparin level 0.16 - no up titration planned for today due to surgery ? ?Goal of Therapy:  ?Heparin level 0.3-0.5 ?INR 2.5-3 (Hx pump thrombosis) ?Monitor platelets by anticoagulation protocol: Yes ?  ?Plan:  ?Continue heparin drip 800 uts/hr - no bolus and no up titration today  ?Daily heparin level, Protime  and CBC ?Monitor s/s bleeding, thrombosis  ? ? ?Alanda Slim, PharmD, FCCM ?Clinical Pharmacist ?Please see AMION for all Pharmacists' Contact Phone  Numbers ?12/04/2021, 8:40 PM  ? ? ? ? ? ? ?

## 2021-12-04 NOTE — Care Management Important Message (Signed)
Important Message ? ?Patient Details  ?Name: Johnny Oneill ?MRN: [id number] ?Date of Birth: 25-Nov-1940 ? ? ?Medicare Important Message Given:  Yes ? ? ? ? ?Ramsey Midgett ?12/04/2021, 1:19 PM ?

## 2021-12-04 NOTE — Op Note (Signed)
NAME: Johnny Oneill, Johnny Oneill ?MEDICAL RECORD NO: [id number] ?ACCOUNT NO: [id number] ?DATE OF BIRTH: October 04, 1940 ?FACILITY: [facility] ?LOCATION: [facility] ?PHYSICIAN: W D. [name]., MD ? ?Operative Report  ? ?DATE OF PROCEDURE: 12/03/2021 ? ?PREOPERATIVE DIAGNOSIS:  Severe osteoarthritis with varus deformity, right knee. ? ?POSTOPERATIVE DIAGNOSIS:  Severe osteoarthritis with varus deformity, right knee. ? ?PROCEDURE:  Right total knee replacement (Attune cemented knee, size 6 femur 5 mm, size 6 tibial bearing, size 7 tibia with 41 mm all poly patella. ? ?SURGEON:  W D. [name]., MD ? ?ASSISTANTZachery Dakins. ? ?ANESTHESIA:  General with block. ? ?TOURNIQUET TIME:  54 minutes. ? ?DESCRIPTION OF PROCEDURE:  Sterile prep and drape, exsanguination of the leg with inflation of thigh tourniquet to 300.  Straight skin incision was made with a medial parapatellar approach to the knee made.  We did a 5-degree 10 mm valgus cut on the  ?femur.  We followed this by cutting approximately 3 mm below the most diseased medial compartment with moderate stripping of the medial side of the knee, complete release of the PCL.  The extension gap was measured at 5 mm.  Femur was sized to be a size  ?6.  Placement of all 4-in-1 cutting block in the appropriate degree of external rotation with the anterior, posterior and chamfer cuts accomplished without difficulty.  Small osteophytes were removed from the posterior aspect of the knee.  The knee was  ?injected in the capsular and subcutaneous tissues with a mixture of Marcaine with Exparel.  Tibia was sized to be a size 7, placement of the keel cut on the tibia followed by the tibial baseplate.  Box cut was made on the femur followed by placement of  ?the femoral trial.  The patient had full extension with good balancing of the ligaments noted.  Patella was cut, resecting 9.5 mm of native patella and the patella trialed as well.  All parameters were deemed to be acceptable.  Cement was prepared on the ?  back table with 1 gram of gentamicin for each batch of cement for a total of 2 batches of cement.  Cement was inserted in the doughy state. Final components inserted in tibia followed by femoral, and patella with the trial bearing.  Cement was allowed  ?to harden.  The trial bearing was removed.  Small bits of cement were removed from the posterior aspect of the knee.  Tourniquet was released under direct vision, no excessive bleeding was noted.  Small bleeders were coagulated.  The final bearing was  ?placed and all stability variables were adequate.  We then closed the capsular incision with #1 Ethibond, the subcutaneous tissues with 2-0 Vicryl and the skin with Monocryl.  Taken to recovery room in stable condition. ? ? ?NIK ?D: 12/03/2021 2:30:16 pm T: 12/04/2021 2:22:00 am  ?JOB: [id number]/ [id number]  ?

## 2021-12-04 NOTE — Progress Notes (Signed)
LVAD Coordinator Rounding Note: ? ?Admitted 12/01/21 to Dr. Haroldine Laws service for heparin bridge for scheduled right total knee arthroplasty. ? 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 Darcey [name] under bridge to transplant criteria. Patient was removed from transplant list 05/2016 per his choice. He is currently Destination [facility] LVAD.  ? ?Patient sitting up in chair at bedside this am with right leg dressing/wrap in place and elevated. Pt reports he had some surgical pain last night that was relieved by oxy and sleeping pill. Reports he slept well after receiving prns. He says he stood this am with assistance and it was very painful. ? ?Discussed restarting heparin gtt with Dr. Haroldine Laws and Jeneen Rinks, PA.  ? ?Surgical PA in room to assess patient. Encouraged him to get OOB and walk as much as possible.  ? ?Vital signs: ?Temp: 98.1 ?HR:   80 ?Doppler Pressure:  84 ?Automatic BP:  104/88 (95) ?O2 Sat:  97% on RA ?Wt: 156.7>157.4 lbs   ? ? ?LVAD interrogation reveals:  ?Speed:  9600 ?Flow:  3.3 ?Power:  5.0w ?PI: 3.3  ?Alarms: on batteries ?Events:  on batteries  ? ?Fixed speed:  9600 ?Low speed limit: 9000 ? ?Drive Line:  Drive line is being maintained weekly by [name], his wife. Existing VAD dressing C/D/I Sorbaview dressing and Silverlon patch intact. Exit site healed and incorporated, the velour is fully implanted at exit site. Pt denies fever or chills. Weekly dressing changes per [facility] nurse or caregiver. Next dressing change due 12/07/21. ? ?Labs:  ?LDH trend: 249>282>172>200 ? ?INR trend:  1.4>1.3>1.3>1.2 ? ?Anticoagulation Plan: ?-INR Goal:  2.5 - 3.0 ?-ASA Dose: 81 mg daily ? ? ?Device: Medtronic dual ?- Therapies: on 231 bpm ?- Pacing:  AAI<=>DDD 80 bpm ? ?Gtts:  ?- Heparin - will restart low dose with no bolus 12/04/21 am per Dr. Haroldine Laws ? ? ?Plan/Recommendations:  ?Call VAD Coordinator with any VAD equipment or drive line issues. ?Weekly dressing changes per [facility] nurse or caregiver.  ? ?Zada Girt RN, VAD  Coordinator ?[phone] ? ? ?

## 2021-12-04 NOTE — Evaluation (Signed)
Physical Therapy Evaluation ?Patient Details ?Name: Johnny Oneill ?MRN: [id number] ?DOB: 02/13/1941 ?Today's Date: 12/04/2021 ? ?History of Present Illness ? 81 yo admitted 4/3 with Rt knee OA for optimization prio to TKA performed 4/5. PMhx: CKD, CHF, HTN, LVAD HM II 2015, HLD, CAD, Lt TKA  ?Clinical Impression ? Pt very pleasant and moving well. Pt educated for knee precautions, transfers, gait and progression. Pt very active and has support of wife at home. Pt limited by lightheadedness this am with automatic cuff registering 146/130 (137) with prior doppler 80-90. Asked RN to re Doppler. Pt able to perform self transition to battery. Pt with decreased strength, function and mobility who will benefit from acute therapy to maximize mobility, safety and independence for return home.  ? ?Speed 9600, flow 4.3, PI 6.4, power 5.6 ?HR 87-115 with activity   ?   ? ?Recommendations for follow up therapy are one component of a multi-disciplinary discharge planning process, led by the attending physician.  Recommendations may be updated based on patient status, additional functional criteria and insurance authorization. ? ?Follow Up Recommendations Follow physician's recommendations for discharge plan and follow up therapies ? ?  ?Assistance Recommended at Discharge PRN  ?Patient can return home with the following ? A little help with walking and/or transfers;A little help with bathing/dressing/bathroom;Assistance with cooking/housework;Assist for transportation ? ?  ?Equipment Recommendations Rolling walker (2 wheels)  ?Recommendations for Other Services ?    ?  ?Functional Status Assessment Patient has had a recent decline in their functional status and demonstrates the ability to make significant improvements in function in a reasonable and predictable amount of time.  ? ?  ?Precautions / Restrictions Precautions ?Precautions: Knee;Fall;Other (comment) ?Precaution Comments: LVAD  ? ?  ? ?Mobility ? Bed Mobility ?Overal bed  mobility: Needs Assistance ?Bed Mobility: Supine to Sit ?  ?  ?Supine to sit: Supervision ?  ?  ?General bed mobility comments: HOB 15 degrees with rail and increased time, no physical assist required ?  ? ?Transfers ?Overall transfer level: Needs assistance ?  ?Transfers: Sit to/from Stand ?Sit to Stand: Min guard ?  ?  ?  ?  ?  ?General transfer comment: cues for hand placement and safety. Pt able to stand x 3 trials and pivot to chair with RW. lImited by dizziness with inaccurate BP and HR 115 ?  ? ?Ambulation/Gait ?Ambulation/Gait assistance: Min guard ?Gait Distance (Feet): 1 Feet ?Assistive device: Rolling walker (2 wheels) ?Gait Pattern/deviations: Step-to pattern ?  ?  ?  ?General Gait Details: cues for sequence with pt only able to step 1' away and back to bed due to lightheadedness ? ?Stairs ?  ?  ?  ?  ?  ? ?Wheelchair Mobility ?  ? ?Modified Rankin (Stroke Patients Only) ?  ? ?  ? ?Balance Overall balance assessment: Needs assistance ?Sitting-balance support: Feet supported ?Sitting balance-Leahy Scale: Good ?  ?  ?Standing balance support: Bilateral upper extremity supported ?Standing balance-Leahy Scale: Poor ?Standing balance comment: bil Ue support on RW for standing ?  ?  ?  ?  ?  ?  ?  ?  ?  ?  ?  ?   ? ? ? ?Pertinent Vitals/Pain Pain Assessment ?Pain Assessment: 0-10 ?Pain Score: 4  ?Pain Location: Rt knee ?Pain Descriptors / Indicators: Aching, Guarding ?Pain Intervention(s): Limited activity within patient's tolerance, Premedicated before session, Repositioned, Monitored during session  ? ? ?Home Living Family/patient expects to be discharged to:: Private residence ?Living Arrangements: Spouse/significant  other ?Available Help at Discharge: Family;Available 24 hours/day ?Type of Home: House ?Home Access: Stairs to enter ?Entrance Stairs-Rails: None ?Entrance Stairs-Number of Steps: 4 ?  ?Home Layout: Two level;Able to live on main level with bedroom/bathroom ?Home Equipment: Air cabin crew  (4 wheels);BSC/3in1 ?   ?  ?Prior Function Prior Level of Function : Independent/Modified Independent ?  ?  ?  ?  ?  ?  ?  ?  ?  ? ? ?Hand Dominance  ?   ? ?  ?Extremity/Trunk Assessment  ? Upper Extremity Assessment ?Upper Extremity Assessment: Overall WFL for tasks assessed ?  ? ?Lower Extremity Assessment ?Lower Extremity Assessment: RLE deficits/detail ?RLE Deficits / Details: decreased ROM as expected post op ?  ? ?Cervical / Trunk Assessment ?Cervical / Trunk Assessment: Normal  ?Communication  ? Communication: No difficulties  ?Cognition Arousal/Alertness: Awake/alert ?Behavior During Therapy: [facility] for tasks assessed/performed ?Overall Cognitive Status: Within Functional Limits for tasks assessed ?  ?  ?  ?  ?  ?  ?  ?  ?  ?  ?  ?  ?  ?  ?  ?  ?  ?  ?  ? ?  ?General Comments   ? ?  ?Exercises Total Joint Exercises ?Heel Slides: AAROM, Right, Supine, 5 reps ?Straight Leg Raises: AROM, Right, Supine, 5 reps ?Long Arc Quad: AROM, Right, Seated, 10 reps  ? ?Assessment/Plan  ?  ?PT Assessment Patient needs continued PT services  ?PT Problem List Decreased strength;Decreased range of motion;Decreased mobility;Decreased activity tolerance;Decreased balance;Decreased knowledge of use of DME;Pain ? ?   ?  ?PT Treatment Interventions DME instruction;Therapeutic exercise;Gait training;Stair training;Functional mobility training;Therapeutic activities;Patient/family education   ? ?PT Goals (Current goals can be found in the Care Plan section)  ?Acute Rehab PT Goals ?Patient Stated Goal: return to volleyball ref ?PT Goal Formulation: With patient ?Time For Goal Achievement: 12/18/21 ?Potential to Achieve Goals: Good ? ?  ?Frequency 7X/week ?  ? ? ?Co-evaluation   ?  ?  ?  ?  ? ? ?  ?AM-PAC PT "6 Clicks" Mobility  ?Outcome Measure Help needed turning from your back to your side while in a flat bed without using bedrails?: A Little ?Help needed moving from lying on your back to sitting on the side of a flat bed without using  bedrails?: A Little ?Help needed moving to and from a bed to a chair (including a wheelchair)?: A Little ?Help needed standing up from a chair using your arms (e.g., wheelchair or bedside chair)?: A Little ?Help needed to walk in hospital room?: A Lot ?Help needed climbing 3-5 steps with a railing? : Total ?6 Click Score: 15 ? ?  ?End of Session Equipment Utilized During Treatment: Gait belt ?Activity Tolerance: Patient tolerated treatment well ?Patient left: in chair;with call bell/phone within reach ?Nurse Communication: Mobility status ?PT Visit Diagnosis: Other abnormalities of gait and mobility (R26.89);Difficulty in walking, not elsewhere classified (R26.2);Muscle weakness (generalized) (M62.81) ?  ? ?Time: 1941-7408 ?PT Time Calculation (min) (ACUTE ONLY): 35 min ? ? ?Charges:   PT Evaluation ?$PT Eval Moderate Complexity: 1 Mod ?PT Treatments ?$Therapeutic Activity: 8-22 mins ?  ?   ? ? ?Faelyn Sigler P, PT ?Acute Rehabilitation Services ?Pager: [phone] ?Office: [phone] ? ? ?Texie Tupou B Lashaunta Sicard ?12/04/2021, 8:32 AM ? ?

## 2021-12-04 NOTE — Progress Notes (Signed)
Subjective: ?1 Day Post-Op Procedure(s) (LRB): ?TOTAL KNEE ARTHROPLASTY (Right) ?Patient reports pain as moderate and reporting some lightheadness.  Slept last night after pain med and sleeping pill. ? ?Objective: ?Vital signs in last 24 hours: ?Temp:  [97.6 ?F (36.4 ?C)-98.2 ?F (36.8 ?C)] 98.1 ?F (36.7 ?C) (04/06 1026) ?Pulse Rate:  [73-82] 80 (04/06 1026) ?Resp:  [13-20] 16 (04/06 1026) ?BP: (90-146)/(74-130) 104/88 (04/06 1026) ?SpO2:  [95 %-100 %] 96 % (04/06 1026) ?Weight:  [75.1 kg] 75.1 kg (04/06 0616) ? ?Intake/Output from previous day: ?04/05 0701 - 04/06 0700 ?In: 3235.2 [P.O.:600; I.V.:1983.8; IV Piggyback:651.5] ?Out: 1175 [Urine:1125; Blood:50] ?Intake/Output this shift: ?Total I/O ?In: 240 [P.O.:240] ?Out: -  ? ?Recent Labs  ?  12/01/21 ?1646 12/02/21 ?5110 12/03/21 ?0056 12/04/21 ?2111  ?HGB 10.2* 10.5* 10.0* 9.3*  ? ?Recent Labs  ?  12/03/21 ?0056 12/04/21 ?0137  ?WBC 4.8 13.7*  ?RBC 3.31* 3.06*  ?HCT 30.4* 28.3*  ?PLT 157 127*  ? ?Recent Labs  ?  12/03/21 ?0056 12/04/21 ?0137  ?NA 137 136  ?K 3.7 4.7  ?CL 108 108  ?CO2 22 22  ?BUN 35* 29*  ?CREATININE 2.33* 2.21*  ?GLUCOSE 214* 150*  ?CALCIUM 8.9 8.7*  ? ?Recent Labs  ?  12/03/21 ?0056 12/04/21 ?0137  ?INR 1.3* 1.2  ? ? ?Sensation intact distally ?Dorsiflexion/Plantar flexion intact ?Incision: dressing C/D/I ? ? ?Assessment/Plan: ?1 Day Post-Op Procedure(s) (LRB): ?TOTAL KNEE ARTHROPLASTY (Right) ?Up with therapy WBAT, once lightheadness has cleared ?Encourage ROM knee CPM machine, leg extension pillow if tolerated. ?Pain control oxycodone, tylenol ?Appreciate excellent management with cards/med dispo will be per primary team, but plan is home with HHPT to start. ?Dsg change prn, has aquacel dsg which may stay in place until first po visit if not too much drainage beneath.   ?Will continue to see daily while inpatient.  ? ? ?Anticipated LOS equal to or greater than 2 midnights due to ?- Age 79 and older with one or more of the following: ? -  Obesity ? - Expected need for hospital services (PT, OT, Nursing) required for safe  discharge ? - Anticipated need for postoperative skilled nursing care or inpatient rehab ? - Active co-morbidities: Heart Failure and Cardiac Arrhythmia ?OR  ? ?- Unanticipated findings during/Post Surgery: None  ?- Patient is a high risk of re-admission due to: 2 or more ED visits within previous 6 months ? ? ?Chriss Czar ?12/04/2021, 2:58 PM ? ?

## 2021-12-04 NOTE — Progress Notes (Signed)
Physical Therapy Treatment ?Patient Details ?Name: Johnny Oneill ?MRN: [id number] ?DOB: 11-15-1940 ?Today's Date: 12/04/2021 ? ? ?History of Present Illness 81 yo admitted 4/3 with Rt knee OA for optimization priro to TKA performed 4/5. PMhx: CKD, CHF, HTN, LVAD HM II 2015, HLD, CAD, Lt TKA, ICD ? ?  ?PT Comments  ? ? Pt pleasant and willing to reattempt mobility but limited by lightheadedness again. Pt with limited knee pain and HR 80 at rest with initial rise to 125 with reports of dizziness and returned to sitting and HR 84 with pt able to make repeated attempts for standing and stepping with HR maintained at 88 last trial and still limited by dizziness. Pt performed HEp and returned to bed with CPM. Will continue to attempt to progress mobility and encouraged OOB to chair again later with nursing.  ? ?Power 5.9, Pi 5.0, flow 4.8   ?Recommendations for follow up therapy are one component of a multi-disciplinary discharge planning process, led by the attending physician.  Recommendations may be updated based on patient status, additional functional criteria and insurance authorization. ? ?Follow Up Recommendations ? Follow physician's recommendations for discharge plan and follow up therapies ?  ?  ?Assistance Recommended at Discharge PRN  ?Patient can return home with the following A little help with walking and/or transfers;A little help with bathing/dressing/bathroom;Assistance with cooking/housework;Assist for transportation ?  ?Equipment Recommendations ? Rolling walker (2 wheels)  ?  ?Recommendations for Other Services   ? ? ?  ?Precautions / Restrictions Precautions ?Precautions: Knee;Fall;Other (comment) ?Precaution Comments: LVAD ?Restrictions ?Weight Bearing Restrictions: Yes  ?  ? ?Mobility ? Bed Mobility ?Overal bed mobility: Needs Assistance ?Bed Mobility: Sit to Supine ?  ?  ?Supine to sit: Supervision ?Sit to supine: Supervision ?  ?General bed mobility comments: cues for safety and positioning with pt  able to lift leg to surface without assist ?  ? ?Transfers ?Overall transfer level: Needs assistance ?  ?Transfers: Sit to/from Stand, Bed to chair/wheelchair/BSC ?Sit to Stand: Min guard ?  ?  ?  ?  ?  ?General transfer comment: pt stood x 4 trials with cues for hand placement and RLE positioning. Limited by dizziness with HR 80-125. pt performed chair to bed pivots x 2 with RW as repeated attempts for gait ?  ? ?Ambulation/Gait ?Ambulation/Gait assistance: Min guard ?Gait Distance (Feet): 5 Feet ?Assistive device: Rolling walker (2 wheels) ?Gait Pattern/deviations: Step-to pattern ?  ?Gait velocity interpretation: <1.31 ft/sec, indicative of household ambulator ?  ?General Gait Details: cues for sequence with pt able to step 5' and 3' respectively but limited by lightheadedness with chair pulled behing pt. ? ? ?Stairs ?  ?  ?  ?  ?  ? ? ?Wheelchair Mobility ?  ? ?Modified Rankin (Stroke Patients Only) ?  ? ? ?  ?Balance Overall balance assessment: Needs assistance ?Sitting-balance support: Feet supported ?Sitting balance-Leahy Scale: Good ?  ?  ?Standing balance support: Bilateral upper extremity supported ?Standing balance-Leahy Scale: Poor ?Standing balance comment: bil Ue support on RW for standing ?  ?  ?  ?  ?  ?  ?  ?  ?  ?  ?  ?  ? ?  ?Cognition Arousal/Alertness: Awake/alert ?Behavior During Therapy: [facility] for tasks assessed/performed ?Overall Cognitive Status: Within Functional Limits for tasks assessed ?  ?  ?  ?  ?  ?  ?  ?  ?  ?  ?  ?  ?  ?  ?  ?  ?  ?  ?  ? ?  ?  Exercises Total Joint Exercises ?Heel Slides: AAROM, Right, Supine, 10 reps ?Straight Leg Raises: AROM, Right, Supine, 5 reps ?Long Arc Quad: AROM, Right, Seated, 10 reps ?Goniometric ROM: 0-70 ? ?  ?General Comments   ?  ?  ? ?Pertinent Vitals/Pain Pain Assessment ?Pain Assessment: 0-10 ?Pain Score: 3  ?Pain Location: Rt knee ?Pain Descriptors / Indicators: Aching, Guarding ?Pain Intervention(s): Limited activity within patient's tolerance,  Monitored during session, Repositioned  ? ? ?Home Living Family/patient expects to be discharged to:: Private residence ?Living Arrangements: Spouse/significant other ?Available Help at Discharge: Family;Available 24 hours/day ?Type of Home: House ?Home Access: Stairs to enter ?Entrance Stairs-Rails: None ?Entrance Stairs-Number of Steps: 4 ?  ?Home Layout: Two level;Able to live on main level with bedroom/bathroom ?Home Equipment: Air cabin crew (4 wheels);BSC/3in1 ?   ?  ?Prior Function    ?  ?  ?   ? ?PT Goals (current goals can now be found in the care plan section) Acute Rehab PT Goals ?Patient Stated Goal: return to volleyball ref ?PT Goal Formulation: With patient ?Time For Goal Achievement: 12/18/21 ?Potential to Achieve Goals: Good ?Progress towards PT goals: Progressing toward goals ? ?  ?Frequency ? ? ? 7X/week ? ? ? ?  ?PT Plan Current plan remains appropriate  ? ? ?Co-evaluation   ?  ?  ?  ?  ? ?  ?AM-PAC PT "6 Clicks" Mobility   ?Outcome Measure ? Help needed turning from your back to your side while in a flat bed without using bedrails?: None ?Help needed moving from lying on your back to sitting on the side of a flat bed without using bedrails?: A Little ?Help needed moving to and from a bed to a chair (including a wheelchair)?: A Little ?Help needed standing up from a chair using your arms (e.g., wheelchair or bedside chair)?: A Little ?Help needed to walk in hospital room?: A Lot ?Help needed climbing 3-5 steps with a railing? : Total ?6 Click Score: 16 ? ?  ?End of Session Equipment Utilized During Treatment: Gait belt ?Activity Tolerance: Patient tolerated treatment well ?Patient left: in chair;with call bell/phone within reach ?Nurse Communication: Mobility status ?PT Visit Diagnosis: Other abnormalities of gait and mobility (R26.89);Difficulty in walking, not elsewhere classified (R26.2);Muscle weakness (generalized) (M62.81) ?  ? ? ?Time: 1638-4665 ?PT Time Calculation (min) (ACUTE  ONLY): 29 min ? ?Charges:  $Therapeutic Exercise: 8-22 mins ?$Therapeutic Activity: 8-22 mins          ?          ? ?Brigitta Pricer P, PT ?Acute Rehabilitation Services ?Pager: [phone] ?Office: [phone] ? ? ? ?Josey Dettmann B Deserai Cansler ?12/04/2021, 11:11 AM ? ?

## 2021-12-04 NOTE — Progress Notes (Addendum)
?Advanced Heart Failure VAD Team Note ? ?PCP-Cardiologist: None  ? ?Subjective:   ? ?S/p R total knee replacement 04/05 ? ?5 minute run WCT around 11 pm last night, appeared regular. ? AFL vs VT. ? ?Reports feeling lightheaded with position changes this am. ? ?Notes RLE pain but able to take a few steps this am. Getting oxycodone for pain. ? ?Scr stable 2.21, K 4.7, Mag 1.7 ? ?PI 3.4 this am. Feels better when PI 5-6. ? ?MAP 80s ? ? ?LVAD INTERROGATION:  ?HeartMate II LVAD:   ?Flow 4.3 liters/min, speed 9600, power 5.7 PI 3.4  ? ?Objective:   ? ?Vital Signs:   ?Temp:  [97.5 ?F (36.4 ?C)-98.2 ?F (36.8 ?C)] 98 ?F (36.7 ?C) (04/06 9371) ?Pulse Rate:  [73-82] 82 (04/06 0737) ?Resp:  [13-21] 15 (04/06 0737) ?BP: (90-146)/(74-130) 146/130 (04/06 0737) ?SpO2:  [95 %-100 %] 96 % (04/06 0737) ?Weight:  [74.4 kg-75.1 kg] 75.1 kg (04/06 0616) ?  ?Mean arterial Pressure 100s ? Watch  ?Intake/Output:  ? ?Intake/Output Summary (Last 24 hours) at 12/04/2021 0808 ?Last data filed at 12/04/2021 0741 ?Gross per 24 hour  ?Intake 3355.24 ml  ?Output 925 ml  ?Net 2430.24 ml  ?  ? ?Physical Exam  ?Physical Exam: ?GENERAL: Well appearing, sitting up in bed. No distress. ?HEENT: normal  ?NECK: Supple, JVP flat.  2+ bilaterally, no bruits.   ?CARDIAC:  Mechanical heart sounds with LVAD hum present.  ?LUNGS:  Clear to auscultation bilaterally.  ?ABDOMEN:  Soft, round, nontender, positive bowel sounds x4.     ?LVAD exit site: well-healed and incorporated.  Dressing dry and intact.  No erythema or drainage.  Stabilization device present and accurately applied.  Driveline dressing is being changed daily per sterile technique. ?EXTREMITIES:  Warm and dry, no cyanosis, clubbing, rash, RLE wrapped ?NEUROLOGIC:  Alert and oriented x 4.  Gait steady.  No aphasia.  No dysarthria.  Affect pleasant.    ? ? ? ?Telemetry  ? ?AV paced 80, 5 beat run WCT at 11 pm last night (? AFL vs VT) ? ?EKG  ?  ?N/A ?Labs  ? ?Basic Metabolic Panel: ?Recent Labs  ?Lab  12/01/21 ?1646 12/02/21 ?6967 12/03/21 ?0056 12/04/21 ?8938  ?NA 136 139 137 136  ?K 5.1 4.4 3.7 4.7  ?CL 107 109 108 108  ?CO2 23 22 22 22   ?GLUCOSE 106* 99 214* 150*  ?BUN 41* 39* 35* 29*  ?CREATININE 2.49* 2.48* 2.33* 2.21*  ?CALCIUM 8.9 9.2 8.9 8.7*  ?MG 1.9  --   --  1.7  ? ? ?Liver Function Tests: ?No results for input(s): AST, ALT, ALKPHOS, BILITOT, PROT, ALBUMIN in the last 168 hours. ?No results for input(s): LIPASE, AMYLASE in the last 168 hours. ?No results for input(s): AMMONIA in the last 168 hours. ? ?CBC: ?Recent Labs  ?Lab 12/01/21 ?1646 12/02/21 ?1017 12/03/21 ?0056 12/04/21 ?5102  ?WBC 5.5 5.2 4.8 13.7*  ?HGB 10.2* 10.5* 10.0* 9.3*  ?HCT 30.9* 32.2* 30.4* 28.3*  ?MCV 91.4 92.3 91.8 92.5  ?PLT 170 155 157 127*  ? ? ?INR: ?Recent Labs  ?Lab 12/01/21 ?1646 12/02/21 ?5852 12/03/21 ?0056 12/04/21 ?7782  ?INR 1.4* 1.3* 1.3* 1.2  ? ? ?Other results: ? ? ? ?Imaging  ? ?No results found. ? ? ?Medications:   ? ? ?Scheduled Medications: ? acetaminophen  1,000 mg Oral Q6H  ? aspirin EC  81 mg Oral q AM  ? citalopram  10 mg Oral Daily  ? docusate sodium  100 mg Oral BID  ? levothyroxine  175 mcg Oral Q0600  ? mexiletine  150 mg Oral BID  ? pantoprazole  40 mg Oral Daily  ? rosuvastatin  20 mg Oral QPM  ? sodium chloride flush  3 mL Intravenous Q12H  ? sotalol  80 mg Oral Daily  ? ? ?Infusions: ? sodium chloride    ? lactated ringers 75 mL/hr at 12/04/21 0616  ? ? ?PRN Medications: ?sodium chloride, acetaminophen, HYDROmorphone (DILAUDID) injection, oxyCODONE **OR** HYDROmorphone, magnesium hydroxide, melatonin, menthol-cetylpyridinium **OR** phenol, metoCLOPramide **OR** metoCLOPramide (REGLAN) injection, ondansetron (ZOFRAN) IV, polyethylene glycol, sorbitol ? ? ?Patient Profile  ?Mr Rastetter is a 71 year with a h/o CAD, chronic HFrEF with HMII LVAD, VT, A flutter, hypothyroidism, HTN, CKD Stage IV, and OA. 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  ? ?Scheduled admit for TKR ? ? ?Assessment/Plan:    ?1)  Osteoarthritis ?- S/p RKR 4/5 ?- Start bearing weight today ?  ?2) Chronic systolic HF: ICM, EF 44-03% s/p LVAD HM II implant 08/2013 for DT.   ?- Echo 3/18 EF 10-15% mild AI. Mod RV dysfunction. LVAD cannula OK ?- has struggled with RV failure however RHC 11/22 looked good (despite evidence of RVF on LE dopplers showing reflux ?- Volume status stable. Typically does not need diuretics.  ?- Remains off all diuretics including spiro ?- Failed Jardiance due to volume depletion ?- He has not tolerated ACE-I or amlodipine or b-blocker in the past. ?  ?3) LVAD: HMII 08/2013 for DT. ?-  Admitted in May 2018 with elevated LDH with bival.  ?- Continue aspirin .  ?- Off coumadin for TKR. Restart heparin gtt and keep on lower end of therapeutic range, resume coumadin with dosing per PharmD ?- INR 1.2.  ?- LDH 282>172>200 ?- Noting orthostatic dizziness this am. PI 3.4 (feels better at 5-6). Has LR running at 75/hr since last night, will continue until 1 pm today then stop. ? How much pain meds adding to dizziness. ?  ?4) VT/Frequent PVCs ?- now s/p ICD gen change.  ?- Off amio due to cerebellar side effects.  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 Now on sotalol+ mexiletine .  ?- Seen in ER 06/16/21 with recurrent VT. DC-CV x 1 ?- Recent syncopal event but no arrhythmias on ICD interrogation ?- 5 minute run WCT 11 pm last night, ? VT. Self terminated. ?- Keep K > 4 and Mag > 2, Mag 1.7. Supp today. ?  ?5) Atrial flutter, recurrent ?- patient paced out of AFL in 6/22. Atrial therapies enabled  ?- continue sotalol (on for VT). QTs slightly prolonged but stable ?- may need to consider ablation if recurs ?  ?6) Chronic anticoagulation: INR goal 2.0-3.0.    ?- INR 1.2 ?-Restart heparin gtt and coumadin as above ?-Continue ASA 81 mg for LVAD  ?  ?7) HTN:  ?- MAP 80s this am.  ?  ?8) Hypothyroidism:  ?- Followed by Dr Dwyane Dee.  ?- Continue levothyroxine.  ?  ?9) Hyperlipidemia:  ?- Continue Crestor. Zetia stopped due to cost.  ?  ?10) CAD ?- No  chest pain.  ?- continue Crestor ?  ?11) CKD IV ?- Creatinine baseline 2.3 - 2.7 ?- Creatinine 2.4 >2.3>2.2 today.  ?- failed Jardiance ? ? ?I reviewed the LVAD parameters from today, and compared the results to the patient's prior recorded data.  No programming changes were made.  The LVAD is functioning within specified parameters.  The patient performs LVAD self-test  daily.  LVAD interrogation was negative for any significant power changes, alarms or PI events/speed drops.  LVAD equipment check completed and is in good working order.  Back-up equipment present.   LVAD education done on emergency procedures and precautions and reviewed exit site care. ? ?Length of Stay: 3 ? ?FINCH, LINDSAY N, PA-C ?12/04/2021, 8:08 AM ? ?VAD Team --- VAD ISSUES ONLY--- ?Pager [phone] (7am - 7am) ? ?Advanced Heart Failure Team  ?Pager [phone] (M-F; 7a - 5p)  ?Please contact [facility] for night-coverage after hours (5p -7a ) and weekends on [email or web address] ? ?Patient seen and examined with the above-signed Advanced Practice Provider and/or Housestaff. I personally reviewed laboratory data, imaging studies and relevant notes. I independently examined the patient and formulated the important aspects of the plan. I have edited the note to reflect any of my changes or salient points. I have personally discussed the plan with the patient and/or family. ? ?Knee sore but ok Had 5 min run of WCT overnight which was hemodynamically stable. Remains on IVF. A bit lightheaded. Renal function stable.  ? ?General:  NAD.  ?HEENT: normal  ?Neck: supple. JVP 7.  Carotids 2+ bilat; no bruits. No lymphadenopathy or thryomegaly appreciated. ?Cor: LVAD hum.  ?Lungs: Clear. ?Abdomen soft, nontender, non-distended. No hepatosplenomegaly. No bruits or masses. Good bowel sounds. Driveline site clean. Anchor in place.  ?Extremities: no cyanosis, clubbing, rash. R knee dressing ?Neuro: alert & oriented x 3. No focal deficits. Moves all 4 without problem   ? ?He is stable post R TKA. Had WCT overnight. VT vs AFL. Now in NSR. Continue sotalol. Keep K > 4.0 Mg > 2.0. Will continue IVF until early afternoon. Start heparin/warfarin. D/w Dr. French Ana and PharmD.  ?

## 2021-12-05 DIAGNOSIS — I5022 Chronic systolic (congestive) heart failure: Secondary | ICD-10-CM | POA: Diagnosis not present

## 2021-12-05 DIAGNOSIS — M1711 Unilateral primary osteoarthritis, right knee: Secondary | ICD-10-CM | POA: Diagnosis not present

## 2021-12-05 DIAGNOSIS — Z95811 Presence of heart assist device: Secondary | ICD-10-CM | POA: Diagnosis not present

## 2021-12-05 LAB — CBC
HCT: 23.9 % — ABNORMAL LOW (ref 39.0–52.0)
Hemoglobin: 7.7 g/dL — ABNORMAL LOW (ref 13.0–17.0)
MCH: 30.4 pg (ref 26.0–34.0)
MCHC: 32.2 g/dL (ref 30.0–36.0)
MCV: 94.5 fL (ref 80.0–100.0)
Platelets: 131 10*3/uL — ABNORMAL LOW (ref 150–400)
RBC: 2.53 MIL/uL — ABNORMAL LOW (ref 4.22–5.81)
RDW: 15.4 % (ref 11.5–15.5)
WBC: 11.1 10*3/uL — ABNORMAL HIGH (ref 4.0–10.5)
nRBC: 0 % (ref 0.0–0.2)

## 2021-12-05 LAB — BASIC METABOLIC PANEL
Anion gap: 6 (ref 5–15)
BUN: 34 mg/dL — ABNORMAL HIGH (ref 8–23)
CO2: 26 mmol/L (ref 22–32)
Calcium: 8.6 mg/dL — ABNORMAL LOW (ref 8.9–10.3)
Chloride: 103 mmol/L (ref 98–111)
Creatinine, Ser: 2.65 mg/dL — ABNORMAL HIGH (ref 0.61–1.24)
GFR, Estimated: 24 mL/min — ABNORMAL LOW (ref >60)
Glucose, Bld: 136 mg/dL — ABNORMAL HIGH (ref 70–99)
Potassium: 4.2 mmol/L (ref 3.5–5.1)
Sodium: 135 mmol/L (ref 135–145)

## 2021-12-05 LAB — HEPARIN LEVEL (UNFRACTIONATED): Heparin Unfractionated: 0.15 IU/mL — ABNORMAL LOW (ref 0.30–0.70)

## 2021-12-05 LAB — LACTATE DEHYDROGENASE: LDH: 144 U/L (ref 98–192)

## 2021-12-05 LAB — PREPARE RBC (CROSSMATCH)

## 2021-12-05 LAB — PROTIME-INR
INR: 1.3 — ABNORMAL HIGH (ref 0.8–1.2)
Prothrombin Time: 16.5 seconds — ABNORMAL HIGH (ref 11.4–15.2)

## 2021-12-05 LAB — MAGNESIUM: Magnesium: 2.6 mg/dL — ABNORMAL HIGH (ref 1.7–2.4)

## 2021-12-05 LAB — HEMOGLOBIN AND HEMATOCRIT, BLOOD
HCT: 25.6 % — ABNORMAL LOW (ref 39.0–52.0)
Hemoglobin: 8.7 g/dL — ABNORMAL LOW (ref 13.0–17.0)

## 2021-12-05 MED ORDER — LACTATED RINGERS IV BOLUS
500.0000 mL | Freq: Once | INTRAVENOUS | Status: AC
  Administered 2021-12-05: 500 mL via INTRAVENOUS

## 2021-12-05 MED ORDER — WARFARIN SODIUM 5 MG PO TABS
5.0000 mg | ORAL_TABLET | Freq: Once | ORAL | Status: AC
  Administered 2021-12-05: 5 mg via ORAL
  Filled 2021-12-05: qty 1

## 2021-12-05 MED ORDER — SODIUM CHLORIDE 0.9% IV SOLUTION: Freq: Once | INTRAVENOUS | Status: AC

## 2021-12-05 NOTE — Progress Notes (Signed)
Physical Therapy Treatment ?Patient Details ?Name: Johnny Oneill ?MRN: [id number] ?DOB: 1941-01-08 ?Today's Date: 12/05/2021 ? ? ?History of Present Illness 81 yo admitted 4/3 with Rt knee OA for optimization prior to TKA performed 4/5. PMhx: CKD, CHF, HTN, LVAD HM II 2015, HLD, CAD, Lt TKA, ICD ? ?  ?PT Comments  ? ? Pt very pleasant and reports stiff knee from not moving last night. PT educated for knee precautions and HEP. Pt able to stand and tolerate limited gait of 22' today with chair follow. Pt remains limited by dizziness with episodes of Vtach with AV pacing and HR 80-125 with movement as well as drop in BP. Will continue to follow and progress as medically stable. RN and LVAD coordinator present and aware.  ? ?HR 80 at rest, transition to sitting immediate rise to 115 and with standing 125 ?Automatic BP supine 89/72 (78)  ?Automatic cuff sitting 77/56 (65) with doppler 80 modified SBP ?Unable to get automatic cuff reading after gait with doppler 58 ? ?Speed 9600, flow 5.1, PI 4.9, power 5.8 ?  ?Recommendations for follow up therapy are one component of a multi-disciplinary discharge planning process, led by the attending physician.  Recommendations may be updated based on patient status, additional functional criteria and insurance authorization. ? ?Follow Up Recommendations ? Follow physician's recommendations for discharge plan and follow up therapies ?  ?  ?Assistance Recommended at Discharge PRN  ?Patient can return home with the following A little help with walking and/or transfers;A little help with bathing/dressing/bathroom;Assistance with cooking/housework;Assist for transportation ?  ?Equipment Recommendations ? Rolling walker (2 wheels)  ?  ?Recommendations for Other Services   ? ? ?  ?Precautions / Restrictions Precautions ?Precautions: Knee;Fall;Other (comment) ?Precaution Comments: LVAD, watch HR and BP  ?  ? ?Mobility ? Bed Mobility ?Overal bed mobility: Needs Assistance ?Bed Mobility: Supine  to Sit ?  ?  ?Supine to sit: Min assist ?  ?  ?General bed mobility comments: min assist with RLE to get to EOB today with cues and HOB 15 degrees ?  ? ?Transfers ?Overall transfer level: Needs assistance ?  ?Transfers: Sit to/from Stand ?Sit to Stand: Min guard ?  ?  ?  ?  ?  ?General transfer comment: cues for hand placement and RLE positioning ?  ? ?Ambulation/Gait ?Ambulation/Gait assistance: Min guard ?Gait Distance (Feet): 22 Feet ?Assistive device: Rolling walker (2 wheels) ?Gait Pattern/deviations: Step-to pattern ?  ?Gait velocity interpretation: <1.31 ft/sec, indicative of household ambulator ?  ?General Gait Details: cues for sequence with pt not yet able to perform step through pattern with pt limited by lightheadedness with HR up to 125 with activity and BP drop with doppler 58 after limited gait with LVAD coordinator present during mobility ? ? ?Stairs ?  ?  ?  ?  ?  ? ? ?Wheelchair Mobility ?  ? ?Modified Rankin (Stroke Patients Only) ?  ? ? ?  ?Balance Overall balance assessment: Needs assistance ?Sitting-balance support: Feet supported ?Sitting balance-Leahy Scale: Good ?  ?  ?Standing balance support: Bilateral upper extremity supported ?Standing balance-Leahy Scale: Poor ?Standing balance comment: bil Ue support on RW for standing ?  ?  ?  ?  ?  ?  ?  ?  ?  ?  ?  ?  ? ?  ?Cognition Arousal/Alertness: Awake/alert ?Behavior During Therapy: [facility] for tasks assessed/performed ?Overall Cognitive Status: Within Functional Limits for tasks assessed ?  ?  ?  ?  ?  ?  ?  ?  ?  ?  ?  ?  ?  ?  ?  ?  ?  ?  ?  ? ?  ?  Exercises Total Joint Exercises ?Heel Slides: AAROM, Right, Supine, 10 reps ?Long Arc Quad: AROM, Right, Seated, 10 reps ? ?  ?General Comments   ?  ?  ? ?Pertinent Vitals/Pain Pain Assessment ?Pain Score: 5  ?Pain Location: Rt knee ?Pain Descriptors / Indicators: Aching, Guarding ?Pain Intervention(s): Limited activity within patient's tolerance, Monitored during session, Repositioned, Patient  requesting pain meds-RN notified  ? ? ?Home Living   ?  ?  ?  ?  ?  ?  ?  ?  ?  ?   ?  ?Prior Function    ?  ?  ?   ? ?PT Goals (current goals can now be found in the care plan section) Progress towards PT goals: Progressing toward goals ? ?  ?Frequency ? ? ? 7X/week ? ? ? ?  ?PT Plan Current plan remains appropriate  ? ? ?Co-evaluation   ?  ?  ?  ?  ? ?  ?AM-PAC PT "6 Clicks" Mobility   ?Outcome Measure ? Help needed turning from your back to your side while in a flat bed without using bedrails?: A Little ?Help needed moving from lying on your back to sitting on the side of a flat bed without using bedrails?: A Little ?Help needed moving to and from a bed to a chair (including a wheelchair)?: A Little ?Help needed standing up from a chair using your arms (e.g., wheelchair or bedside chair)?: A Little ?Help needed to walk in hospital room?: A Lot ?Help needed climbing 3-5 steps with a railing? : Total ?6 Click Score: 15 ? ?  ?End of Session Equipment Utilized During Treatment: Gait belt ?Activity Tolerance: Other (comment) (limited by dizziness) ?Patient left: in chair;with call bell/phone within reach;with nursing/sitter in room ?Nurse Communication: Mobility status ?PT Visit Diagnosis: Other abnormalities of gait and mobility (R26.89);Difficulty in walking, not elsewhere classified (R26.2);Muscle weakness (generalized) (M62.81) ?  ? ? ?Time: 3382-5053 ?PT Time Calculation (min) (ACUTE ONLY): 33 min ? ?Charges:  $Gait Training: 8-22 mins ?$Therapeutic Exercise: 8-22 mins          ?          ? ?Johnny Oneill P, PT ?Acute Rehabilitation Services ?Pager: [phone] ?Office: [phone] ? ? ? ?Johnny Oneill B Johnny Oneill ?12/05/2021, 8:25 AM ? ?

## 2021-12-05 NOTE — Progress Notes (Signed)
Physical Therapy Treatment ?Patient Details ?Name: Johnny Oneill ?MRN: [id number] ?DOB: [birth year] ?Today's Date: 12/05/2021 ? ? ?History of Present Illness 81 yo admitted 4/3 with Rt knee OA for optimization prior to TKA performed 4/5. PMhx: CKD, CHF, HTN, LVAD HM II 2015, HLD, CAD, Lt TKA, ICD ? ?  ?PT Comments  ? ? Pt received bolus and PRBCs running during session with significantly improved activity tolerance with HR at rest 80, rise to 86 with sitting EOB and 110bpm with standing. No significant dizziness this session with increased gait distance. Returned to supine for CPM end of session with wife present.  ?   ?Recommendations for follow up therapy are one component of a multi-disciplinary discharge planning process, led by the attending physician.  Recommendations may be updated based on patient status, additional functional criteria and insurance authorization. ? ?Follow Up Recommendations ? Follow physician's recommendations for discharge plan and follow up therapies ?  ?  ?Assistance Recommended at Discharge PRN  ?Patient can return home with the following A little help with walking and/or transfers;A little help with bathing/dressing/bathroom;Assistance with cooking/housework;Assist for transportation ?  ?Equipment Recommendations ? Rolling walker (2 wheels)  ?  ?Recommendations for Other Services   ? ? ?  ?Precautions / Restrictions Precautions ?Precautions: Knee;Fall;Other (comment) ?Precaution Comments: LVAD, watch HR and BP  ?  ? ?Mobility ? Bed Mobility ?Overal bed mobility: Needs Assistance ?Bed Mobility: Supine to Sit, Sit to Supine ?  ?  ?Supine to sit: Min assist ?Sit to supine: Min assist ?  ?General bed mobility comments: min assist of RLE and HHA to elevate trunk to EOB, assist of RLE to return to bed ?  ? ?Transfers ?Overall transfer level: Needs assistance ?  ?Transfers: Sit to/from Stand ?Sit to Stand: Min guard ?  ?  ?  ?  ?  ?General transfer comment: cues for hand placement and RLE  positioning from bed and chair ?  ? ?Ambulation/Gait ?Ambulation/Gait assistance: Min guard ?Gait Distance (Feet): 40 Feet ?Assistive device: Rolling walker (2 wheels) ?Gait Pattern/deviations: Step-to pattern ?  ?Gait velocity interpretation: <1.31 ft/sec, indicative of household ambulator ?  ?General Gait Details: cues for sequence with pt slowly progressing to step through pattern. cues for posture and proximity to RW. Chair follow with pt able to state fatigue not dizziness limiting gait with HR <110 with activity. ? ? ?Stairs ?  ?  ?  ?  ?  ? ? ?Wheelchair Mobility ?  ? ?Modified Rankin (Stroke Patients Only) ?  ? ? ?  ?Balance Overall balance assessment: Needs assistance ?Sitting-balance support: Feet supported ?Sitting balance-Leahy Scale: Good ?  ?  ?Standing balance support: Bilateral upper extremity supported ?Standing balance-Leahy Scale: Poor ?Standing balance comment: bil Ue support on RW for standing ?  ?  ?  ?  ?  ?  ?  ?  ?  ?  ?  ?  ? ?  ?Cognition Arousal/Alertness: Awake/alert ?Behavior During Therapy: [facility] for tasks assessed/performed ?Overall Cognitive Status: Within Functional Limits for tasks assessed ?  ?  ?  ?  ?  ?  ?  ?  ?  ?  ?  ?  ?  ?  ?  ?  ?  ?  ?  ? ?  ?Exercises Total Joint Exercises ?Heel Slides: AAROM, Right, Supine, 10 reps ?Long Arc Quad: AROM, Right, Seated, 15 reps ?Knee Flexion: AAROM, Right, Seated, 15 reps ?Goniometric ROM: 0-60 ? ?  ?General Comments   ?  ?  ? ?  Pertinent Vitals/Pain Pain Assessment ?Pain Score: 3  ?Pain Location: Rt knee ?Pain Descriptors / Indicators: Aching, Guarding ?Pain Intervention(s): Limited activity within patient's tolerance, Monitored during session, Premedicated before session, Repositioned  ? ? ?Home Living   ?  ?  ?  ?  ?  ?  ?  ?  ?  ?   ?  ?Prior Function    ?  ?  ?   ? ?PT Goals (current goals can now be found in the care plan section) Progress towards PT goals: Progressing toward goals ? ?  ?Frequency ? ? ? 7X/week ? ? ? ?  ?PT Plan  Current plan remains appropriate  ? ? ?Co-evaluation   ?  ?  ?  ?  ? ?  ?AM-PAC PT "6 Clicks" Mobility   ?Outcome Measure ? Help needed turning from your back to your side while in a flat bed without using bedrails?: A Little ?Help needed moving from lying on your back to sitting on the side of a flat bed without using bedrails?: A Little ?Help needed moving to and from a bed to a chair (including a wheelchair)?: A Little ?Help needed standing up from a chair using your arms (e.g., wheelchair or bedside chair)?: A Little ?Help needed to walk in hospital room?: A Little ?Help needed climbing 3-5 steps with a railing? : A Lot ?6 Click Score: 17 ? ?  ?End of Session   ?Activity Tolerance: Patient tolerated treatment well ?Patient left: in bed;with call bell/phone within reach;with family/visitor present ?Nurse Communication: Mobility status ?PT Visit Diagnosis: Other abnormalities of gait and mobility (R26.89);Difficulty in walking, not elsewhere classified (R26.2);Muscle weakness (generalized) (M62.81) ?  ? ? ?Time: 4081-4481 ?PT Time Calculation (min) (ACUTE ONLY): 44 min ? ?Charges:  $Gait Training: 8-22 mins ?$Therapeutic Exercise: 8-22 mins ?$Therapeutic Activity: 8-22 mins          ?          ? ?Johnny Oneill P, PT ?Acute Rehabilitation Services ?Pager: [phone] ?Office: [phone] ? ? ? ?Johnny Oneill B Johnny Oneill ?12/05/2021, 2:54 PM ? ?

## 2021-12-05 NOTE — Progress Notes (Signed)
ANTICOAGULATION CONSULT NOTE ? ?Pharmacy Consult for heparin and warfarin ?Indication: LVAD ? ?Allergies  ?Allergen Reactions  ? Ace Inhibitors   ?  Hypotension and elevated creatinine  ? Amiodarone Other (See Comments)  ?  Cerebellar side effects  ? Diovan [Valsartan] Other (See Comments)  ?  Hypotension at low dose, hyperkalemia  ? Lipitor [Atorvastatin Calcium] Other (See Comments)  ?  Muscle pain  ? Jardiance [Empagliflozin] Other (See Comments)  ?  Unsure of exact side effect  ? Norvasc [Amlodipine] Other (See Comments)  ?  Put patient to sleep  ? ? ?Patient Measurements: ?Height: 5\' 7"  (170.2 cm) ?Weight: 76.6 kg (168 lb 14 oz) ?IBW/kg (Calculated) : 66.1 ? Heparin dosing weight: 71.1 kg ? ?Vital Signs: ?Temp: 98 ?F (36.7 ?C) (04/07 1337) ?Temp Source: Oral (04/07 1337) ?BP: 90/80 (04/07 1337) ?Pulse Rate: 84 (04/07 1450) ? ?Labs: ?Recent Labs  ?  12/03/21 ?0056 12/04/21 ?0137 12/04/21 ?2000 12/05/21 ?0053  ?HGB 10.0* 9.3*  --  7.7*  ?HCT 30.4* 28.3*  --  23.9*  ?PLT 157 127*  --  131*  ?LABPROT 16.4* 15.5*  --  16.5*  ?INR 1.3* 1.2  --  1.3*  ?HEPARINUNFRC 0.42 <0.10* 0.16* 0.15*  ?CREATININE 2.33* 2.21*  --  2.65*  ? ? ? ?Estimated Creatinine Clearance: 21.5 mL/min (A) (by C-G formula based on SCr of 2.65 mg/dL (H)). ? ? ?Medical History: ?Past Medical History:  ?Diagnosis Date  ? Anxiety   ? Automatic implantable cardioverter-defibrillator in situ   ? CAD (coronary artery disease)   ? S/P stenting of LCX in 2004;  s/p Lat MI 2009 with occlusion of the LCX - treated with Promus stenting. Has total occlusion of the RCA.   ? CHF (congestive heart failure) ([place])   ? Hypercholesteremia   ? Hypertension   ? Hypothyroidism   ? ICD (implantable cardiac defibrillator) in place   ? PROPHYLACTIC      medtronic  ? Inferior MI ([place]) "? date"; 2009  ? Ischemic cardiomyopathy   ? a. 10/09 Echo: Sev LV Dysfxn, inf/lat AK, Mod MR.  ? Liver disease   ? LVAD (left ventricular assist device) present ([place])   ? Osteoarthritis    ? a. s/p R TKR  ? Pacemaker   ? PVC (premature ventricular contraction)   ? RBBB   ? Shortness of breath   ? ? ?Medications:  ?Medications Prior to Admission  ?Medication Sig Dispense Refill Last Dose  ? acetaminophen (TYLENOL) 500 MG tablet Take 500 mg by mouth every 6 (six) hours as needed (pain).     ? aspirin EC 81 MG tablet Take 81 mg by mouth in the morning.     ? cholecalciferol (VITAMIN D3) 25 MCG (1000 UT) tablet Take 1,000 Units by mouth in the morning.     ? citalopram (CELEXA) 10 MG tablet Take 1 tablet by mouth once daily 90 tablet 3   ? Coenzyme Q10 (COQ10) 200 MG CAPS Take 200 mg by mouth in the morning.     ? famotidine (PEPCID) 20 MG tablet Take 20 mg by mouth in the morning.     ? furosemide (LASIX) 20 MG tablet Take 1 tablet (20 mg total) by mouth daily as needed (Take 20 mg as daily as needed for wt > 153 lbs). Reported on 01/14/2016 (Patient not taking: Reported on 11/26/2021) 30 tablet 6   ? melatonin 5 MG TABS Take 5 mg by mouth at bedtime as needed (sleep).     ?  mexiletine (MEXITIL) 150 MG capsule Take 1 capsule (150 mg total) by mouth 2 (two) times daily. 60 capsule 6   ? neomycin-bacitracin-polymyxin (NEOSPORIN) ointment Apply 1 application. topically as needed for wound care. (Patient not taking: Reported on 11/26/2021)     ? pantoprazole (PROTONIX) 40 MG tablet Take 1 tablet by mouth once daily 90 tablet 3   ? potassium chloride SA (KLOR-CON) 20 MEQ tablet Take 1 tablet (20 mEq total) by mouth daily as needed. Take one potassium pill when you take your lasix. (Patient not taking: Reported on 11/26/2021) 30 tablet 6   ? Probiotic Product (PROBIOTIC PO) Take 1 capsule by mouth in the morning.     ? sotalol (BETAPACE) 80 MG tablet TAKE 1 TABLET (80 MG TOTAL) BY MOUTH DAILY. 90 tablet 3   ? SYNTHROID 175 MCG tablet 1 tablet daily every morning except half tablet on Sundays (Patient taking differently: 1 tablet daily every morning) 30 tablet 3   ? traMADol (ULTRAM) 50 MG tablet Take 1-2 tablets  (50-100 mg total) by mouth every 6 (six) hours as needed for moderate pain. (Patient taking differently: Take 50-100 mg by mouth every 6 (six) hours as needed for moderate pain (knee pain).) 45 tablet 5   ? traZODone (DESYREL) 50 MG tablet Take 1 tablet (50 mg total) by mouth at bedtime. (Patient taking differently: Take 50 mg by mouth at bedtime as needed for sleep.) 30 tablet 5   ? rosuvastatin (CRESTOR) 20 MG tablet Take 1 tablet (20 mg total) by mouth daily. 90 tablet 3   ? warfarin (COUMADIN) 5 MG tablet Take 5 mg (1 tablet) every day OR as directed by [facility] 135 tablet 11 11/27/2021  ? ?Scheduled:  ? sodium chloride   Intravenous Once  ? aspirin EC  81 mg Oral q AM  ? citalopram  10 mg Oral Daily  ? docusate sodium  100 mg Oral BID  ? levothyroxine  175 mcg Oral Q0600  ? mexiletine  150 mg Oral BID  ? midodrine  5 mg Oral TID WC  ? pantoprazole  40 mg Oral Daily  ? rosuvastatin  20 mg Oral QPM  ? sodium chloride flush  3 mL Intravenous Q12H  ? sotalol  80 mg Oral Daily  ? warfarin  5 mg Oral ONCE-1600  ? Warfarin - Pharmacist Dosing Inpatient   Does not apply [id number]  ? ? ?Assessment: ?81 yo male on warfarin PTA for LVAD (placed 08/2013). Plans are for R TKR on 4/5 (last dose of warfarin given 3/30). INR 1.3 today. ?Pharmacy consulted to dose heparin while INR < 2 ? ?Heparin drip stopped preop 4/5  ?S/p TKR ?Per ortho ok to resume low drip rate heparin and warfarin  ?INR 1.3 after warfarin 5mg  x1  ?Heparin driip 800 uts/hr heparin level 0.16 as expected to be < goal on low rate post op ?Hgb fell < 7 replace PRBC today - will slowly up titrate to goal 0.3  ?LDH back to baseline  200 ? 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  ?Plan:  ?Increase heparin drip 900 uts/hr - no bolus ?Warfarin 5mg  x1  ?Daily heparin level, Protime  and CBC ?Monitor s/s bleeding, thrombosis  ? ? ?Bonnita Nasuti Pharm.D. CPP, BCPS ?Clinical  Pharmacist ?[phone] ?12/05/2021 3:18 PM  ? ?Please check AMION for all [facility] numbers ?12/05/2021 ? ? ? ? ? ?

## 2021-12-05 NOTE — TOC CM/SW Note (Signed)
HF TOC CM reviewed notes from Orth[facility], and pt HH is arranged with [facility] and DME was preorder. Pt will receive CPM and RW for home. Will follow up for any additional dc needs.Jonnie Finner RN3 CCM, Heart Failure TOC CM [phone]  ?

## 2021-12-05 NOTE — Progress Notes (Addendum)
?Advanced Heart Failure VAD Team Note ? ?PCP-Cardiologist: None  ? ?Subjective:   ? ?S/p R total knee replacement 04/05 ? ?INR 1.3. Restarted warfarin 04/06. On heparin gtt.  ? ?Hgb 10>9.3>7.7. ? ?Scr up slightly, 2.48>2.33>2.21>2.65 ? ?Doppler pressure pressure and MAP down to 60s last night. Midodrine added. MAPs 70s and doppler pressure around 80 this am.  ? ?Having quite a bit of RLE pain with movement. ? ?AV pacing 80s, intermittently v paced with rates 110s. ? D/t pain. Episode noted while I was in room with patient attempting exercises with PT. ? ? ?LVAD INTERROGATION:  ?HeartMate II LVAD:   ?Flow 4.7 liters/min, speed 9600, power 5.0 PI 5.2 ? ?Objective:   ? ?Vital Signs:   ?Temp:  [97.6 ?F (36.4 ?C)-98.7 ?F (37.1 ?C)] 98.7 ?F (37.1 ?C) (04/07 0600) ?Pulse Rate:  [76-82] 80 (04/07 0600) ?Resp:  [14-20] 18 (04/07 0600) ?BP: (75-146)/(63-130) 86/66 (04/07 0600) ?SpO2:  [93 %-96 %] 93 % (04/07 0600) ?Weight:  [76.6 kg] 76.6 kg (04/07 0636) ?  ?Mean arterial Pressure 70s this am ?Intake/Output:  ? ?Intake/Output Summary (Last 24 hours) at 12/05/2021 0735 ?Last data filed at 12/05/2021 0300 ?Gross per 24 hour  ?Intake 834.49 ml  ?Output 600 ml  ?Net 234.49 ml  ?  ? ?Physical Exam  ?Physical Exam: ?GENERAL: Lying in bed. No distress. ?HEENT: normal  ?NECK: Supple, NO JVD .  2+ bilaterally, no bruits.   ?CARDIAC:  Mechanical heart sounds with LVAD hum present.  ?LUNGS:  Clear to auscultation bilaterally.  ?ABDOMEN:  Soft, round, nontender, positive bowel sounds x4.     ?LVAD exit site: well-healed and incorporated.  Dressing dry and intact.  No erythema or drainage.  Stabilization device present and accurately applied.  Driveline dressing is being changed daily per sterile technique. ?EXTREMITIES:  Warm and dry, no cyanosis, clubbing, rash, RLE wrapped. Grimaces with movement of RLE ?NEUROLOGIC:  Alert and oriented x 4.  Gait steady.  No aphasia.  No dysarthria.  Affect pleasant.    ? ? ? ? ? ?Telemetry  ? ?AV paced  80s, intermittently V paced with rates 110s ? ?Labs  ? ?Basic Metabolic Panel: ?Recent Labs  ?Lab 12/01/21 ?1646 12/02/21 ?5638 12/03/21 ?7564 12/04/21 ?3329 12/05/21 ?0053  ?NA 136 139 137 136 135  ?K 5.1 4.4 3.7 4.7 4.2  ?CL 107 109 108 108 103  ?CO2 23 22 22 22 26   ?GLUCOSE 106* 99 214* 150* 136*  ?BUN 41* 39* 35* 29* 34*  ?CREATININE 2.49* 2.48* 2.33* 2.21* 2.65*  ?CALCIUM 8.9 9.2 8.9 8.7* 8.6*  ?MG 1.9  --   --  1.7 2.6*  ? ? ?Liver Function Tests: ?No results for input(s): AST, ALT, ALKPHOS, BILITOT, PROT, ALBUMIN in the last 168 hours. ?No results for input(s): LIPASE, AMYLASE in the last 168 hours. ?No results for input(s): AMMONIA in the last 168 hours. ? ?CBC: ?Recent Labs  ?Lab 12/01/21 ?1646 12/02/21 ?5188 12/03/21 ?4166 12/04/21 ?0630 12/05/21 ?0053  ?WBC 5.5 5.2 4.8 13.7* 11.1*  ?HGB 10.2* 10.5* 10.0* 9.3* 7.7*  ?HCT 30.9* 32.2* 30.4* 28.3* 23.9*  ?MCV 91.4 92.3 91.8 92.5 94.5  ?PLT 170 155 157 127* 131*  ? ? ?INR: ?Recent Labs  ?Lab 12/01/21 ?1646 12/02/21 ?1601 12/03/21 ?0932 12/04/21 ?3557 12/05/21 ?0053  ?INR 1.4* 1.3* 1.3* 1.2 1.3*  ? ? ?Other results: ? ? ? ?Imaging  ? ?No results found. ? ? ?Medications:   ? ? ?Scheduled Medications: ? aspirin EC  81  mg Oral q AM  ? citalopram  10 mg Oral Daily  ? docusate sodium  100 mg Oral BID  ? levothyroxine  175 mcg Oral Q0600  ? mexiletine  150 mg Oral BID  ? midodrine  5 mg Oral TID WC  ? pantoprazole  40 mg Oral Daily  ? rosuvastatin  20 mg Oral QPM  ? sodium chloride flush  3 mL Intravenous Q12H  ? sotalol  80 mg Oral Daily  ? Warfarin - Pharmacist Dosing Inpatient   Does not apply [id number]  ? ? ?Infusions: ? sodium chloride    ? heparin 800 Units/hr (12/05/21 0300)  ? ? ?PRN Medications: ?sodium chloride, acetaminophen, HYDROmorphone (DILAUDID) injection, oxyCODONE **OR** HYDROmorphone, magnesium hydroxide, melatonin, menthol-cetylpyridinium **OR** phenol, metoCLOPramide **OR** metoCLOPramide (REGLAN) injection, ondansetron (ZOFRAN) IV, polyethylene  glycol, sorbitol ? ? ?Patient Profile  ?Johnny Oneill is a 28 year with a h/o CAD, chronic HFrEF with HMII LVAD, VT, A flutter, hypothyroidism, HTN, CKD Stage IV, and OA. 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  ? ?Scheduled admit for TKR ? ? ?Assessment/Plan:   ?1)  Osteoarthritis ?- S/p RKR 4/5 ?- Continue working with PT/OT ?- Need to ensure adequate pain control so he can participate with therapy. Discussed with RN. ?  ?2) Chronic systolic HF: ICM, EF 42-68% s/p LVAD HM II implant 08/2013 for DT.   ?- Echo 3/18 EF 10-15% mild AI. Mod RV dysfunction. LVAD cannula OK ?- has struggled with RV failure however RHC 11/22 looked good (despite evidence of RVF on LE dopplers showing reflux ?- Volume status stable. Typically does not need diuretics.  ?- Remains off all diuretics including spiro ?- Failed Jardiance due to volume depletion ?- He has not tolerated ACE-I or amlodipine or b-blocker in the past. ?  ?3) LVAD: HMII 08/2013 for DT. ?-  Admitted in May 2018 with elevated LDH with bival.  ?- Continue aspirin. ?- Off coumadin for TKR. Restart heparin gtt and keep on lower end of therapeutic range, resume coumadin with dosing per PharmD. ?- INR 1.3 ?- LDH 282>172>200>144 ?- Midodrine 5 mg TID started overnight d/t MAPs in 60s. MAP 70s this am and doppler pressure 80s. Monitor. LR stopped yesterday afternoon. ?  ?4) VT/Frequent PVCs ?- now s/p ICD gen change.  ?- Off amio due to cerebellar side effects.  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 Now on sotalol+ mexiletine.  ?- Seen in ER 06/16/21 with recurrent VT. DC-CV x 1 ?- Recent syncopal event but no arrhythmias on ICD interrogation ?- 5 minute run WCT 04/05 ? VT/AFL. Self terminated. ?- Keep K > 4 and Mag > 2 ?  ?5) Atrial flutter, recurrent ?- patient paced out of AFL in 6/22. Atrial therapies enabled  ?- continue sotalol (on for VT). QTs slightly prolonged but stable ?- may need to consider ablation if recurs ?- AV paced 80s, but intermittently V paced with rates 110s  (brief). ? D/t pain. ?  ?6) Chronic anticoagulation: INR goal 2.0-3.0.    ?- INR 1.3 ?-Continue heparin gtt and coumadin as above ?-Continue ASA 81 mg for LVAD  ?  ?7) HTN:  ?- MAP down to 60s last night. Now on midodrine ?- MAP 70s this am, doppler 80 ?  ?8) Hypothyroidism:  ?- Followed by Dr Dwyane Dee.  ?- Continue levothyroxine.  ?  ?9) Hyperlipidemia:  ?- Continue Crestor. Zetia stopped due to cost.  ?  ?10) CAD ?- No chest pain.  ?- continue Crestor ?  ?11)  CKD IV ?- Creatinine baseline 2.3 - 2.7 ?- Creatinine 2.4 >2.3>2.2>2.6, suspect had some volume depletion peri-op. Now off IV fluids. Midodrine added d/t low MAP. ?- Daily labs ?- failed Jardiance ? ?12) Anemia ?- Baseline Hgb 10-11 ?- Post-op, 10>9.3>7.7, suspect post-op blood loss anemia ?- Will give 1 u RBCs ? ? ?Continue working with PT/OT. ?Anticipate need for [facility] PT/OT/RN. Will engage HF TOC CSW to assist. ? ?I reviewed the LVAD parameters from today, and compared the results to the patient's prior recorded data.  No programming changes were made.  The LVAD is functioning within specified parameters.  The patient performs LVAD self-test daily.  LVAD interrogation was negative for any significant power changes, alarms or PI events/speed drops.  LVAD equipment check completed and is in good working order.  Back-up equipment present.   LVAD education done on emergency procedures and precautions and reviewed exit site care. ? ?Length of Stay: 4 ? ?FINCH, LINDSAY N, PA-C ?12/05/2021, 7:35 AM ? ?VAD Team --- VAD ISSUES ONLY--- ?Pager [phone] (7am - 7am) ? ?Advanced Heart Failure Team  ?Pager [phone] (M-F; 7a - 5p)  ?Please contact [facility] for night-coverage after hours (5p -7a ) and weekends on [email or web address] ? ?Patient seen and examined with the above-signed Advanced Practice Provider and/or Housestaff. I personally reviewed laboratory data, imaging studies and relevant notes. I independently examined the patient and formulated the important aspects of the  plan. I have edited the note to reflect any of my changes or salient points. I have personally discussed the plan with the patient and/or family. ? ?Remains lightheaded and orthostatic. Hgb has drifted dow

## 2021-12-05 NOTE — Progress Notes (Signed)
LVAD Coordinator Rounding Note: ? ?Admitted 12/01/21 to Dr. Haroldine Laws service for heparin bridge for scheduled right total knee arthroplasty. ? 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 Darcey [name] under bridge to transplant criteria. Patient was removed from transplant list 05/2016 per his choice. He is currently Destination Therapy LVAD.  ? ?Patient sitting up in bed this am. PT and Jeneen Rinks, PA in room with patient. ? ?BP low last night, pt was started on Midodrine 5 mg tid. BS nurse confirms he had first dose last night and has had am dose this morning.  ? ?Assisted PT with getting patient up. Slowly sat on side of bed with increase HR from AV paced 80 up to 120. BP monitored - see below. Pt did c/o dizziness with orthostatic changes. Dr. Haroldine Laws updated, patient will receive one unit PCs + 500 cc IVF bolus + midodrine dose prior to next walk with PT.  ? ?Vital signs   (lying):  Sitting:  Standing:  ?Temp: 98.9 ?HR:      81   116   124   ?Doppler Pressure:   80   80   58 ?Automatic BP:   89/72 (78)  77/56 (65)  --- ? ?O2 Sat:  97% on RA ?Wt: 156.7>157.4>158.5>165>168 lbs   ? ? ?LVAD interrogation reveals:  ?Speed:  9600 ?Flow:  4.8 ?Power:  5.3w ?PI: 5.4 ?Alarms: on batteries ?Events:  on batteries  ? ?Fixed speed:  9600 ?Low speed limit: 9000 ? ?Drive Line:  Drive line is being maintained weekly by [name], his wife. Existing VAD dressing C/D/I Sorbaview dressing and Silverlon patch intact. Exit site healed and incorporated, the velour is fully implanted at exit site. Pt denies fever or chills. Weekly dressing changes per [facility] nurse or caregiver. Next dressing change due 12/07/21. ? ?Labs:  ?LDH trend: 249>282>172>200>144 ? ?INR trend:  1.4>1.3>1.3>1.2>1.3 ? ?Anticoagulation Plan: ?-INR Goal:  2.5 - 3.0 ?-ASA Dose: 81 mg daily ? ? ?Device: Medtronic dual ?- Therapies: on 231 bpm ?- Pacing:  AAI<=>DDD 80 bpm ? ?Gtts:  ?- Heparin - 800 units/hr ? ? ?Plan/Recommendations:  ?Call VAD Coordinator with any VAD equipment or  drive line issues. ?Weekly dressing changes per [facility] nurse or caregiver.  ? ?Zada Girt RN, VAD Coordinator ?[phone] ? ? ?

## 2021-12-05 NOTE — Plan of Care (Signed)
?  Problem: Education: ?Goal: Knowledge of General Education information will improve ?Description: Including pain rating scale, medication(s)/side effects and non-pharmacologic comfort measures ?Outcome: Progressing ?  ?Problem: Health Behavior/Discharge Planning: ?Goal: Ability to manage health-related needs will improve ?Outcome: Progressing ?  ?Problem: Clinical Measurements: ?Goal: Ability to maintain clinical measurements within normal limits will improve ?Outcome: Progressing ?Goal: Will remain free from infection ?Outcome: Progressing ?Goal: Respiratory complications will improve ?Outcome: Progressing ?Goal: Cardiovascular complication will be avoided ?Outcome: Progressing ?  ?Problem: Activity: ?Goal: Risk for activity intolerance will decrease ?Outcome: Progressing ?  ?Problem: Nutrition: ?Goal: Adequate nutrition will be maintained ?Outcome: Progressing ?  ?Problem: Coping: ?Goal: Level of anxiety will decrease ?Outcome: Progressing ?  ?Problem: Safety: ?Goal: Ability to remain free from injury will improve ?Outcome: Progressing ?  ?Problem: Skin Integrity: ?Goal: Risk for impaired skin integrity will decrease ?Outcome: Progressing ?  ?

## 2021-12-05 NOTE — Progress Notes (Signed)
Subjective: ?2 Days Post-Op Procedure(s) (LRB): ?TOTAL KNEE ARTHROPLASTY (Right) ? ?Patient gets dizzy with physical therapy. His pain has improved.  ? ?Objective: ?Vital signs in last 24 hours: ?Temp:  [97.5 ?F (36.4 ?C)-98.9 ?F (37.2 ?C)] 98 ?F (36.7 ?C) (04/07 1337) ?Pulse Rate:  [76-125] 84 (04/07 1450) ?Resp:  [14-20] 16 (04/07 1337) ?BP: (75-119)/(56-93) 90/80 (04/07 1337) ?SpO2:  [90 %-96 %] 94 % (04/07 1337) ?Weight:  [76.6 kg] 76.6 kg (04/07 0636) ? ?Intake/Output from previous day: ?04/06 0701 - 04/07 0700 ?In: 834.5 [P.O.:720; I.V.:114.5] ?Out: 600 [Urine:600] ?Intake/Output this shift: ?Total I/O ?In: 32.1 [I.V.:32.1] ?Out: -  ? ?Recent Labs  ?  12/03/21 ?0056 12/04/21 ?0137 12/05/21 ?0053  ?HGB 10.0* 9.3* 7.7*  ? ?Recent Labs  ?  12/04/21 ?0137 12/05/21 ?0053  ?WBC 13.7* 11.1*  ?RBC 3.06* 2.53*  ?HCT 28.3* 23.9*  ?PLT 127* 131*  ? ?Recent Labs  ?  12/04/21 ?0137 12/05/21 ?0053  ?NA 136 135  ?K 4.7 4.2  ?CL 108 103  ?CO2 22 26  ?BUN 29* 34*  ?CREATININE 2.21* 2.65*  ?GLUCOSE 150* 136*  ?CALCIUM 8.7* 8.6*  ? ?Recent Labs  ?  12/04/21 ?0137 12/05/21 ?0053  ?INR 1.2 1.3*  ? ? ?Sensation intact distally ?Dorsiflexion/Plantar flexion intact ?Incision: dressing C/D/I ? ? ?Assessment/Plan: ?2 Days Post-Op Procedure(s) (LRB): ?TOTAL KNEE ARTHROPLASTY (Right) ?Up with therapy WBAT, once lightheadness has cleared ?Encourage ROM knee CPM machine, leg extension pillow if tolerated. ?Pain control oxycodone, tylenol ?Appreciate excellent management with cards/med dispo will be per primary team, but plan is home with HHPT to start. ?Dsg change prn, has aquacel dsg which may stay in place until first po visit if not too much drainage beneath.   ?Will continue to see daily while inpatient.  ? ? ?Anticipated LOS equal to or greater than 2 midnights due to ?- Age 79 and older with one or more of the following: ? - Obesity ? - Expected need for hospital services (PT, OT, Nursing) required for safe  discharge ? - Anticipated  need for postoperative skilled nursing care or inpatient rehab ? - Active co-morbidities: Heart Failure and Cardiac Arrhythmia ?OR  ? ?- Unanticipated findings during/Post Surgery: None  ?- Patient is a high risk of re-admission due to: 2 or more ED visits within previous 6 months ? ? ?Johnny Oneill ?12/05/2021, 3:09 PM ? ?

## 2021-12-06 DIAGNOSIS — I5022 Chronic systolic (congestive) heart failure: Secondary | ICD-10-CM | POA: Diagnosis not present

## 2021-12-06 DIAGNOSIS — M158 Other polyosteoarthritis: Secondary | ICD-10-CM | POA: Diagnosis not present

## 2021-12-06 DIAGNOSIS — Z95811 Presence of heart assist device: Secondary | ICD-10-CM | POA: Diagnosis not present

## 2021-12-06 LAB — PROTIME-INR
INR: 1.2 (ref 0.8–1.2)
Prothrombin Time: 15.5 seconds — ABNORMAL HIGH (ref 11.4–15.2)

## 2021-12-06 LAB — CBC
HCT: 24.7 % — ABNORMAL LOW (ref 39.0–52.0)
Hemoglobin: 8.1 g/dL — ABNORMAL LOW (ref 13.0–17.0)
MCH: 30.6 pg (ref 26.0–34.0)
MCHC: 32.8 g/dL (ref 30.0–36.0)
MCV: 93.2 fL (ref 80.0–100.0)
Platelets: 125 10*3/uL — ABNORMAL LOW (ref 150–400)
RBC: 2.65 MIL/uL — ABNORMAL LOW (ref 4.22–5.81)
RDW: 14.9 % (ref 11.5–15.5)
WBC: 11 10*3/uL — ABNORMAL HIGH (ref 4.0–10.5)
nRBC: 0 % (ref 0.0–0.2)

## 2021-12-06 LAB — HEPARIN LEVEL (UNFRACTIONATED)
Heparin Unfractionated: <0.1 IU/mL — ABNORMAL LOW (ref 0.30–0.70)
Heparin Unfractionated: 0.2 IU/mL — ABNORMAL LOW (ref 0.30–0.70)
Heparin Unfractionated: 0.26 IU/mL — ABNORMAL LOW (ref 0.30–0.70)

## 2021-12-06 LAB — TYPE AND SCREEN
ABO/RH(D): O NEG
Antibody Screen: NEGATIVE
Unit division: 0

## 2021-12-06 LAB — BASIC METABOLIC PANEL
Anion gap: 6 (ref 5–15)
BUN: 39 mg/dL — ABNORMAL HIGH (ref 8–23)
CO2: 23 mmol/L (ref 22–32)
Calcium: 8.5 mg/dL — ABNORMAL LOW (ref 8.9–10.3)
Chloride: 103 mmol/L (ref 98–111)
Creatinine, Ser: 2.44 mg/dL — ABNORMAL HIGH (ref 0.61–1.24)
GFR, Estimated: 26 mL/min — ABNORMAL LOW (ref >60)
Glucose, Bld: 116 mg/dL — ABNORMAL HIGH (ref 70–99)
Potassium: 5.1 mmol/L (ref 3.5–5.1)
Sodium: 132 mmol/L — ABNORMAL LOW (ref 135–145)

## 2021-12-06 LAB — BPAM RBC
Blood Product Expiration Date: 202304282359
ISSUE DATE / TIME: 202304071309
Unit Type and Rh: 5100

## 2021-12-06 LAB — MAGNESIUM: Magnesium: 2.2 mg/dL (ref 1.7–2.4)

## 2021-12-06 LAB — LACTATE DEHYDROGENASE: LDH: 290 U/L — ABNORMAL HIGH (ref 98–192)

## 2021-12-06 MED ORDER — ZOLPIDEM TARTRATE 5 MG PO TABS
5.0000 mg | ORAL_TABLET | Freq: Every evening | ORAL | Status: DC | PRN
  Administered 2021-12-06 – 2021-12-11 (×6): 5 mg via ORAL
  Filled 2021-12-06 (×6): qty 1

## 2021-12-06 MED ORDER — WARFARIN SODIUM 7.5 MG PO TABS
7.5000 mg | ORAL_TABLET | Freq: Once | ORAL | Status: AC
  Administered 2021-12-06: 7.5 mg via ORAL
  Filled 2021-12-06: qty 1

## 2021-12-06 MED ORDER — MIDODRINE HCL 5 MG PO TABS
10.0000 mg | ORAL_TABLET | Freq: Three times a day (TID) | ORAL | Status: DC
  Administered 2021-12-06 – 2021-12-08 (×7): 10 mg via ORAL
  Filled 2021-12-06 (×7): qty 2

## 2021-12-06 MED ORDER — SENNA 8.6 MG PO TABS
2.0000 | ORAL_TABLET | Freq: Every day | ORAL | Status: DC | PRN
  Administered 2021-12-06: 17.2 mg via ORAL
  Filled 2021-12-06: qty 2

## 2021-12-06 NOTE — Progress Notes (Signed)
Subjective: ?3 Days Post-Op Procedure(s) (LRB): ?TOTAL KNEE ARTHROPLASTY (Right) ? ?Patient gets dizzy with physical therapy. Did better yesterday. His pain has improved. Not sleeping well.  ? ?Objective: ?Vital signs in last 24 hours: ?Temp:  [97.5 ?F (36.4 ?C)-98.9 ?F (37.2 ?C)] 98.5 ?F (36.9 ?C) (04/08 2440) ?Pulse Rate:  [80-125] 80 (04/08 0313) ?Resp:  [13-19] 19 (04/08 0313) ?BP: (77-112)/(56-94) 96/85 (04/08 1027) ?SpO2:  [90 %-96 %] 95 % (04/08 0313) ?Weight:  [76.6 kg] 76.6 kg (04/07 0636) ? ?Intake/Output from previous day: ?04/07 0701 - 04/08 0700 ?In: 718.6 [P.O.:200; I.V.:203.6; Blood:315] ?Out: 850 [Urine:850] ?Intake/Output this shift: ?Total I/O ?In: 70 [I.V.:72] ?Out: 850 [Urine:850] ? ?Recent Labs  ?  12/04/21 ?0137 12/05/21 ?0053 12/05/21 ?1944 12/06/21 ?2536  ?HGB 9.3* 7.7* 8.7* 8.1*  ? ?Recent Labs  ?  12/05/21 ?0053 12/05/21 ?1944 12/06/21 ?6440  ?WBC 11.1*  --  11.0*  ?RBC 2.53*  --  2.65*  ?HCT 23.9* 25.6* 24.7*  ?PLT 131*  --  125*  ? ?Recent Labs  ?  12/05/21 ?0053 12/06/21 ?3474  ?NA 135 132*  ?K 4.2 5.1  ?CL 103 103  ?CO2 26 23  ?BUN 34* 39*  ?CREATININE 2.65* 2.44*  ?GLUCOSE 136* 116*  ?CALCIUM 8.6* 8.5*  ? ?Recent Labs  ?  12/05/21 ?0053 12/06/21 ?2595  ?INR 1.3* 1.2  ? ? ?Sensation intact distally ?Dorsiflexion/Plantar flexion intact ?Incision: dressing C/D/I ? ? ?Assessment/Plan: ?3 Days Post-Op Procedure(s) (LRB): ?TOTAL KNEE ARTHROPLASTY (Right) ?Up with therapy WBAT, once lightheadness has cleared ?Encourage ROM knee CPM machine, leg extension pillow if tolerated. ?Pain control oxycodone, tylenol ?Appreciate excellent management with cards/med dispo will be per primary team, but plan is home with HHPT to start. ?Dsg change prn, has aquacel dsg which may stay in place until first po visit if not too much drainage beneath.   ?Will continue to see daily while inpatient.  ? ? ?Anticipated LOS equal to or greater than 2 midnights due to ?- Age 79 and older with one or more of the  following: ? - Obesity ? - Expected need for hospital services (PT, OT, Nursing) required for safe  discharge ? - Anticipated need for postoperative skilled nursing care or inpatient rehab ? - Active co-morbidities: Heart Failure and Cardiac Arrhythmia ?OR  ? ?- Unanticipated findings during/Post Surgery: None  ?- Patient is a high risk of re-admission due to: 2 or more ED visits within previous 6 months ? ?Will add Ambien PRN for sleep.  ? ?Johnny Oneill ?12/06/2021, 3:38 AM ? ?

## 2021-12-06 NOTE — Progress Notes (Signed)
ANTICOAGULATION CONSULT NOTE ?Pharmacy Consult for heparin  ?Indication: LVAD ?Brief A/P: Continue Heparin at current rate  ? ?Allergies  ?Allergen Reactions  ? Ace Inhibitors   ?  Hypotension and elevated creatinine  ? Amiodarone Other (See Comments)  ?  Cerebellar side effects  ? Diovan [Valsartan] Other (See Comments)  ?  Hypotension at low dose, hyperkalemia  ? Lipitor [Atorvastatin Calcium] Other (See Comments)  ?  Muscle pain  ? Jardiance [Empagliflozin] Other (See Comments)  ?  Unsure of exact side effect  ? Norvasc [Amlodipine] Other (See Comments)  ?  Put patient to sleep  ? ? ?Patient Measurements: ?Height: 5\' 7"  (170.2 cm) ?Weight: 76 kg (167 lb 8.8 oz) ?IBW/kg (Calculated) : 66.1 ? Heparin dosing weight: 71.1 kg ? ?Vital Signs: ?Temp: 98.9 ?F (37.2 ?C) (04/08 2322) ?Temp Source: Axillary (04/08 2322) ?BP: 79/65 (04/08 2322) ?Pulse Rate: 80 (04/08 2322) ? ?Labs: ?Recent Labs  ?  12/04/21 ?0137 12/04/21 ?2000 12/05/21 ?0053 12/05/21 ?1944 12/06/21 ?9675 12/06/21 ?1227 12/06/21 ?2300  ?HGB 9.3*  --  7.7* 8.7* 8.1*  --   --   ?HCT 28.3*  --  23.9* 25.6* 24.7*  --   --   ?PLT 127*  --  131*  --  125*  --   --   ?LABPROT 15.5*  --  16.5*  --  15.5*  --   --   ?INR 1.2  --  1.3*  --  1.2  --   --   ?HEPARINUNFRC <0.10*   < > 0.15*  --  <0.10* 0.20* 0.26*  ?CREATININE 2.21*  --  2.65*  --  2.44*  --   --   ? < > = values in this interval not displayed.  ? ? ? ?Estimated Creatinine Clearance: 23.3 mL/min (A) (by C-G formula based on SCr of 2.44 mg/dL (H)). ? ? ?Assessment: ?81 yo male with LVAD and subtherapeutic INR for heparin ? 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  ?Plan:  ?Continue Heparin at current rate  ? ?Phillis Knack, PharmD, BCPS  ? ? ? ? ? ? ?

## 2021-12-06 NOTE — Progress Notes (Signed)
Physical Therapy Treatment ?Patient Details ?Name: Johnny Oneill ?MRN: [id number] ?DOB: 03/03/1941 ?Today's Date: 12/06/2021 ? ? ?History of Present Illness 81 yo admitted 4/3 with Rt knee OA for optimization prior to TKA performed 4/5. PMhx: CKD, CHF, HTN, LVAD HM II 2015, HLD, CAD, Lt TKA, ICD ? ?  ?PT Comments  ? ? Pt is not progressing towards his physical therapy goals; remains limited by hypotension, + dizziness/lightheadedness, right knee pain, weakness, and decreased ROM. Pt with redness around R knee dressing, increased edema, and is very stiff; ice applied post session. Worked on supine/sitting exercises for ROM and strengthening. Pt achieving 74 degrees seated knee flexion with over pressure. Unable to ambulate today due to hypotension (see below), HR 117-122 vtach. Transferred bed to chair with min-mod assist. MD notified. Will continue efforts. ? ?Vitals: ?Supine: 100/86 (93) ?Sitting post 1st stand trial: 62/51 (57) ?Sitting x 5 minute: 96/82 (88) ?Sitting post 2nd stand trial: 70/46 (55) ?   ?Recommendations for follow up therapy are one component of a multi-disciplinary discharge planning process, led by the attending physician.  Recommendations may be updated based on patient status, additional functional criteria and insurance authorization. ? ?Follow Up Recommendations ? Follow physician's recommendations for discharge plan and follow up therapies ?  ?  ?Assistance Recommended at Discharge Frequent or constant Supervision/Assistance  ?Patient can return home with the following A little help with walking and/or transfers;A little help with bathing/dressing/bathroom;Assistance with cooking/housework;Assist for transportation ?  ?Equipment Recommendations ? Rolling walker (2 wheels)  ?  ?Recommendations for Other Services   ? ? ?  ?Precautions / Restrictions Precautions ?Precautions: Knee;Fall;Other (comment) ?Precaution Comments: LVAD, watch HR and BP ?Restrictions ?Weight Bearing Restrictions: No  ?   ? ?Mobility ? Bed Mobility ?Overal bed mobility: Needs Assistance ?Bed Mobility: Supine to Sit ?  ?  ?Supine to sit: Mod assist ?  ?  ?General bed mobility comments: Assist for RLE and elevating trunk to edge of bed in addition to use of bed pad to scoot hips forward ?  ? ?Transfers ?Overall transfer level: Needs assistance ?Equipment used: Rolling walker (2 wheels) ?Transfers: Sit to/from Stand, Bed to chair/wheelchair/BSC ?Sit to Stand: Min assist, Mod assist ?Stand pivot transfers: Min assist ?  ?  ?  ?  ?General transfer comment: Min-modA to stand x 3 from edge of bed, pivoting towards right on 3rd trial, cues for sequencing/technique and controlled descent to chair ?  ? ?Ambulation/Gait ?  ?  ?  ?  ?  ?  ?  ?General Gait Details: unable due to + orthostasis and dizziness ? ? ?Stairs ?  ?  ?  ?  ?  ? ? ?Wheelchair Mobility ?  ? ?Modified Rankin (Stroke Patients Only) ?  ? ? ?  ?Balance Overall balance assessment: Needs assistance ?Sitting-balance support: Feet supported ?Sitting balance-Leahy Scale: Good ?  ?  ?Standing balance support: Bilateral upper extremity supported ?Standing balance-Leahy Scale: Poor ?Standing balance comment: bil Ue support on RW for standing ?  ?  ?  ?  ?  ?  ?  ?  ?  ?  ?  ?  ? ?  ?Cognition Arousal/Alertness: Awake/alert ?Behavior During Therapy: [facility] for tasks assessed/performed ?Overall Cognitive Status: Within Functional Limits for tasks assessed ?  ?  ?  ?  ?  ?  ?  ?  ?  ?  ?  ?  ?  ?  ?  ?  ?  ?  ?  ? ?  ?  Exercises Total Joint Exercises ?Ankle Circles/Pumps: Right, 15 reps, Supine ?Quad Sets: Right, 15 reps, Supine ?Heel Slides: AROM, AAROM, Right, 15 reps, Supine, Seated ?Hip ABduction/ADduction: AAROM, Right, 5 reps, Supine ?Long Arc Quad: AAROM, Right, 10 reps, Seated ?Goniometric ROM: 0-74 ? ?  ?General Comments   ?  ?  ? ?Pertinent Vitals/Pain Pain Assessment ?Pain Assessment: Faces ?Faces Pain Scale: Hurts even more ?Pain Location: Rt knee ?Pain Descriptors / Indicators:  Aching, Guarding ?Pain Intervention(s): Limited activity within patient's tolerance, Monitored during session, Repositioned, Ice applied  ? ? ?Home Living   ?  ?  ?  ?  ?  ?  ?  ?  ?  ?   ?  ?Prior Function    ?  ?  ?   ? ?PT Goals (current goals can now be found in the care plan section) Acute Rehab PT Goals ?Potential to Achieve Goals: Good ?Progress towards PT goals: Not progressing toward goals - comment ? ?  ?Frequency ? ? ? 7X/week ? ? ? ?  ?PT Plan Current plan remains appropriate  ? ? ?Co-evaluation   ?  ?  ?  ?  ? ?  ?AM-PAC PT "6 Clicks" Mobility   ?Outcome Measure ? Help needed turning from your back to your side while in a flat bed without using bedrails?: A Little ?Help needed moving from lying on your back to sitting on the side of a flat bed without using bedrails?: A Lot ?Help needed moving to and from a bed to a chair (including a wheelchair)?: A Little ?Help needed standing up from a chair using your arms (e.g., wheelchair or bedside chair)?: A Lot ?Help needed to walk in hospital room?: Total ?Help needed climbing 3-5 steps with a railing? : Total ?6 Click Score: 12 ? ?  ?End of Session Equipment Utilized During Treatment: Gait belt ?Activity Tolerance: Other (comment) (limited by + orthostasis) ?Patient left: in chair;with call bell/phone within reach ?Nurse Communication: Mobility status;Other (comment) (vitals) ?PT Visit Diagnosis: Other abnormalities of gait and mobility (R26.89);Difficulty in walking, not elsewhere classified (R26.2);Muscle weakness (generalized) (M62.81) ?  ? ? ?Time: 4825-0037 ?PT Time Calculation (min) (ACUTE ONLY): 47 min ? ?Charges:  $Therapeutic Exercise: 23-37 mins ?$Therapeutic Activity: 8-22 mins          ?          ? ?Wyona Almas, PT, DPT ?Acute Rehabilitation Services ?Pager [phone] ?Office [phone] ? ? ? ?Johnny Oneill ?12/06/2021, 11:44 AM ? ?

## 2021-12-06 NOTE — Plan of Care (Signed)

## 2021-12-06 NOTE — Progress Notes (Signed)
Physical Therapy Treatment ?Patient Details ?Name: Johnny Oneill ?MRN: [id number] ?DOB: Oct 12, 1940 ?Today's Date: 12/06/2021 ? ? ?History of Present Illness 81 yo admitted 4/3 with Rt knee OA for optimization prior to TKA performed 4/5. PMhx: CKD, CHF, HTN, LVAD HM II 2015, HLD, CAD, Lt TKA, ICD ? ?  ?PT Comments  ? ? Pt with significant improvement in afternoon session, demonstrating improved activity tolerance and ambulation distance. Pt ambulating 80 ft with a walker at a min guard assist level. Emphasis on right heel strike and progressing to step through pattern as tolerated. HR 80, BP 103/77 (86). Will continue to focus on gait/stair training prior to discharge in addition to reinforcement of HEP.  ?   ?Recommendations for follow up therapy are one component of a multi-disciplinary discharge planning process, led by the attending physician.  Recommendations may be updated based on patient status, additional functional criteria and insurance authorization. ? ?Follow Up Recommendations ? Follow physician's recommendations for discharge plan and follow up therapies ?  ?  ?Assistance Recommended at Discharge Frequent or constant Supervision/Assistance  ?Patient can return home with the following A little help with walking and/or transfers;A little help with bathing/dressing/bathroom;Assistance with cooking/housework;Assist for transportation ?  ?Equipment Recommendations ? Rolling walker (2 wheels)  ?  ?Recommendations for Other Services   ? ? ?  ?Precautions / Restrictions Precautions ?Precautions: Knee;Fall;Other (comment) ?Precaution Comments: LVAD, watch HR and BP ?Restrictions ?Weight Bearing Restrictions: No  ?  ? ?Mobility ? Bed Mobility ?Overal bed mobility: Needs Assistance ?Bed Mobility: Supine to Sit, Sit to Supine ?  ?  ?Supine to sit: Min assist ?Sit to supine: Mod assist ?  ?General bed mobility comments: Assist for RLE negotiation out of bed, BLE elevation back into bed ?  ? ?Transfers ?Overall  transfer level: Needs assistance ?Equipment used: Rolling walker (2 wheels) ?Transfers: Sit to/from Stand ?Sit to Stand: Min assist ?Stand pivot transfers: Min assist ?  ?  ?  ?  ?General transfer comment: MinA to power up from edge of bed, cues for glute/quad activation ?  ? ?Ambulation/Gait ?Ambulation/Gait assistance: Min guard ?Gait Distance (Feet): 80 Feet ?Assistive device: Rolling walker (2 wheels) ?Gait Pattern/deviations: Step-to pattern, Decreased stance time - right, Decreased dorsiflexion - right, Decreased weight shift to right, Decreased step length - left, Antalgic, Step-through pattern ?Gait velocity: decreased ?Gait velocity interpretation: <1.8 ft/sec, indicate of risk for recurrent falls ?  ?General Gait Details: Cues for right heel strike, decreased step length, sequencing, segmental turning, quad activation, and step through pattern ? ? ?Stairs ?  ?  ?  ?  ?  ? ? ?Wheelchair Mobility ?  ? ?Modified Rankin (Stroke Patients Only) ?  ? ? ?  ?Balance Overall balance assessment: Needs assistance ?Sitting-balance support: Feet supported ?Sitting balance-Leahy Scale: Good ?  ?  ?Standing balance support: Bilateral upper extremity supported ?Standing balance-Leahy Scale: Poor ?Standing balance comment: bil Ue support on RW for standing ?  ?  ?  ?  ?  ?  ?  ?  ?  ?  ?  ?  ? ?  ?Cognition Arousal/Alertness: Awake/alert ?Behavior During Therapy: [facility] for tasks assessed/performed ?Overall Cognitive Status: Within Functional Limits for tasks assessed ?  ?  ?  ?  ?  ?  ?  ?  ?  ?  ?  ?  ?  ?  ?  ?  ?  ?  ?  ? ?  ?Exercises Total Joint Exercises ?Ankle Circles/Pumps: Right, 15  reps, Supine ?Quad Sets: Right, 15 reps, Supine ?Heel Slides: AROM, AAROM, Right, 15 reps, Supine, Seated ?Hip ABduction/ADduction: AAROM, Right, 5 reps, Supine ?Long Arc Quad: AAROM, Right, 10 reps, Seated ?Goniometric ROM: 0-74 ? ?  ?General Comments   ?  ?  ? ?Pertinent Vitals/Pain Pain Assessment ?Pain Assessment: Faces ?Faces Pain  Scale: Hurts little more ?Pain Location: Rt knee ?Pain Descriptors / Indicators: Aching, Guarding ?Pain Intervention(s): Limited activity within patient's tolerance, Monitored during session  ? ? ?Home Living Family/patient expects to be discharged to:: Private residence ?Living Arrangements: Spouse/significant other ?Available Help at Discharge: Family;Available 24 hours/day ?Type of Home: House ?Home Access: Stairs to enter ?Entrance Stairs-Rails: None ?Entrance Stairs-Number of Steps: 4 ?Alternate Level Stairs-Number of Steps: Walk in shower on the 2nd level.  Bathtub only on the main level. ?Home Layout: Two level;Able to live on main level with bedroom/bathroom ?Home Equipment: Air cabin crew (4 wheels);BSC/3in1 ?Additional Comments: Probably looking at sink baths until he can manage stairs.  ?  ?Prior Function    ?  ?  ?   ? ?PT Goals (current goals can now be found in the care plan section) Acute Rehab PT Goals ?Potential to Achieve Goals: Good ?Progress towards PT goals: Progressing toward goals ? ?  ?Frequency ? ? ? 7X/week ? ? ? ?  ?PT Plan Current plan remains appropriate  ? ? ?Co-evaluation   ?  ?  ?  ?  ? ?  ?AM-PAC PT "6 Clicks" Mobility   ?Outcome Measure ? Help needed turning from your back to your side while in a flat bed without using bedrails?: A Little ?Help needed moving from lying on your back to sitting on the side of a flat bed without using bedrails?: A Little ?Help needed moving to and from a bed to a chair (including a wheelchair)?: A Little ?Help needed standing up from a chair using your arms (e.g., wheelchair or bedside chair)?: A Little ?Help needed to walk in hospital room?: A Little ?Help needed climbing 3-5 steps with a railing? : A Lot ?6 Click Score: 17 ? ?  ?End of Session Equipment Utilized During Treatment: Gait belt ?Activity Tolerance: Patient tolerated treatment well ?Patient left: with call bell/phone within reach;in bed ?Nurse Communication: Mobility status ?PT Visit  Diagnosis: Other abnormalities of gait and mobility (R26.89);Difficulty in walking, not elsewhere classified (R26.2);Muscle weakness (generalized) (M62.81) ?  ? ? ?Time: 6333-5456 ?PT Time Calculation (min) (ACUTE ONLY): 40 min ? ?Charges:  $Gait Training: 23-37 mins ?$Therapeutic Activity: 8-22 mins          ?          ? ?Wyona Almas, PT, DPT ?Acute Rehabilitation Services ?Pager [phone] ?Office [phone] ? ? ? ?Deno Etienne ?12/06/2021, 3:53 PM ? ?

## 2021-12-06 NOTE — Progress Notes (Signed)
?Advanced Heart Failure VAD Team Note ? ?PCP-Cardiologist: None  ? ?Subjective:   ? ?- S/p R total knee replacement 04/05 ?- 4/7 transfused 1u RBCs  ? ?On heparin gtt. HL undetectable. INR 1.2 ? ?Remains very orthostatic. MAPs drop into 50s with standing. Weight up 10 pounds from baseline.  ? ?Denies CP or SOB. Did knee ROM exercises today with PT.  ? ? ?LVAD INTERROGATION:  ?HeartMate II LVAD:   ?Flow5.8 liters/min, speed 9600, power 6.0 PI 5.9 ? ?Objective:   ? ?Vital Signs:   ?Temp:  [97.5 ?F (36.4 ?C)-98.7 ?F (37.1 ?C)] 98.5 ?F (36.9 ?C) (04/08 5329) ?Pulse Rate:  [80-84] 80 (04/08 0743) ?Resp:  [12-19] 12 (04/08 0743) ?BP: (84-112)/(63-94) 105/89 (04/08 0743) ?SpO2:  [90 %-96 %] 96 % (04/08 0743) ?Weight:  [76 kg] 76 kg (04/08 0500) ?  ?Mean arterial Pressure  90s at rest -> 50s with standing ?Intake/Output:  ? ?Intake/Output Summary (Last 24 hours) at 12/06/2021 1114 ?Last data filed at 12/06/2021 0800 ?Gross per 24 hour  ?Intake 886.47 ml  ?Output 1050 ml  ?Net -163.53 ml  ? ?  ? ?Physical Exam  ? ?General:  NAD.  ?HEENT: normal  ?Neck: supple. JVP 9-10  Carotids 2+ bilat; no bruits. No lymphadenopathy or thryomegaly appreciated. ?Cor: LVAD hum.  ?Lungs: Clear. ?Abdomen: obese soft, nontender, non-distended. No hepatosplenomegaly. No bruits or masses. Good bowel sounds. Driveline site clean. Anchor in place.  ?Extremities: no cyanosis, clubbing, rash. Warm no edema   R knee mildly swollen ?Neuro: alert & oriented x 3. No focal deficits. Moves all 4 without problem  ? ?Telemetry  ? ?AV paced 80s Personally reviewed ? ?Labs  ? ?Basic Metabolic Panel: ?Recent Labs  ?Lab 12/01/21 ?1646 12/02/21 ?9242 12/03/21 ?6834 12/04/21 ?1962 12/05/21 ?0053 12/06/21 ?2297  ?NA 136 139 137 136 135 132*  ?K 5.1 4.4 3.7 4.7 4.2 5.1  ?CL 107 109 108 108 103 103  ?CO2 23 22 22 22 26 23   ?GLUCOSE 106* 99 214* 150* 136* 116*  ?BUN 41* 39* 35* 29* 34* 39*  ?CREATININE 2.49* 2.48* 2.33* 2.21* 2.65* 2.44*  ?CALCIUM 8.9 9.2 8.9 8.7* 8.6*  8.5*  ?MG 1.9  --   --  1.7 2.6* 2.2  ? ? ? ?Liver Function Tests: ?No results for input(s): AST, ALT, ALKPHOS, BILITOT, PROT, ALBUMIN in the last 168 hours. ?No results for input(s): LIPASE, AMYLASE in the last 168 hours. ?No results for input(s): AMMONIA in the last 168 hours. ? ?CBC: ?Recent Labs  ?Lab 12/02/21 ?9892 12/03/21 ?1194 12/04/21 ?1740 12/05/21 ?0053 12/05/21 ?1944 12/06/21 ?8144  ?WBC 5.2 4.8 13.7* 11.1*  --  11.0*  ?HGB 10.5* 10.0* 9.3* 7.7* 8.7* 8.1*  ?HCT 32.2* 30.4* 28.3* 23.9* 25.6* 24.7*  ?MCV 92.3 91.8 92.5 94.5  --  93.2  ?PLT 155 157 127* 131*  --  125*  ? ? ? ?INR: ?Recent Labs  ?Lab 12/02/21 ?8185 12/03/21 ?6314 12/04/21 ?9702 12/05/21 ?0053 12/06/21 ?6378  ?INR 1.3* 1.3* 1.2 1.3* 1.2  ? ? ? ?Other results: ? ? ? ?Imaging  ? ?No results found. ? ? ?Medications:   ? ? ?Scheduled Medications: ? aspirin EC  81 mg Oral q AM  ? citalopram  10 mg Oral Daily  ? docusate sodium  100 mg Oral BID  ? levothyroxine  175 mcg Oral Q0600  ? mexiletine  150 mg Oral BID  ? midodrine  5 mg Oral TID WC  ? pantoprazole  40 mg Oral  Daily  ? rosuvastatin  20 mg Oral QPM  ? sodium chloride flush  3 mL Intravenous Q12H  ? sotalol  80 mg Oral Daily  ? warfarin  7.5 mg Oral ONCE-1600  ? Warfarin - Pharmacist Dosing Inpatient   Does not apply T0240  ? ? ?Infusions: ? sodium chloride    ? heparin 1,100 Units/hr (12/06/21 0805)  ? ? ?PRN Medications: ?sodium chloride, acetaminophen, HYDROmorphone (DILAUDID) injection, oxyCODONE **OR** HYDROmorphone, magnesium hydroxide, melatonin, menthol-cetylpyridinium **OR** phenol, metoCLOPramide **OR** metoCLOPramide (REGLAN) injection, ondansetron (ZOFRAN) IV, polyethylene glycol, senna, sorbitol, zolpidem ? ? ?Patient Profile  ?Johnny Oneill is a 79 year with a h/o CAD, chronic HFrEF with HMII LVAD, VT, A flutter, hypothyroidism, HTN, CKD Stage IV, and OA. 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  ? ?Scheduled admit for TKR ? ? ?Assessment/Plan:   ? 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 Therapy limited by orthostasis ?- increase midodrine ? ?2) Orthostatic hypotension ?- suspect vasoplegia ?- volume replete ?- increase midodrine to 10 tid ?- compression hose ?  ?3) Chronic systolic HF: ICM, EF 97-35% s/p LVAD HM II implant 08/2013 for DT.   ?- Echo 3/18 EF 10-15% mild AI. Mod RV dysfunction. LVAD cannula OK ?- has struggled with RV failure however RHC 11/22 looked good (despite evidence of RVF on LE dopplers showing reflux ?- Volume status replete (maybe even a bit high) ?- Remains off all diuretics including spiro ?- Failed Jardiance due to volume depletion ?- He has not tolerated ACE-I or amlodipine or b-blocker in the past. ?  ?4) LVAD: HMII 08/2013 for DT. ?-  Admitted in May 2018 with elevated LDH with bival.  ?- Continue aspirin. ?- Off coumadin for TKR. Restart heparin gtt and keep on lower end of therapeutic range, resume coumadin with dosing per PharmD. ?- INR 1.2 Continue heparin/warfarin Discussed dosing with PharmD personally. ?- LDH 282>172>200>144 -> 200 ?- MAPS are orthostatic. Management as above ?  ?5) VT/Frequent PVCs ?- now s/p ICD gen change.  ?- Off amio due to cerebellar side effects.  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 Now on sotalol+ mexiletine.  ?- 06/16/21 recurrent VT. DC-CV x 1 ?- 5 minute run WCT 04/05 ? VT/AFL. Self terminated. ?- Keep K > 4 and Mag > 2 ?  ?6) Paroxysmal Atrial flutter ?- patient paced out of AFL in 6/22. Atrial therapies enabled  ?- continue sotalol (on for VT). QTs slightly prolonged but stable ?- may need to consider ablation if recurs ?- AV paced 80s, but intermittently V paced with rates 110s (brief). ? D/t pain. ?  ?7) Chronic anticoagulation: INR goal 2.0-3.0.    ?- INR 1.2. Plan as above ?  ?8) Hypothyroidism:  ?- Followed by Dr Dwyane Dee.  ?- Continue levothyroxine.  ?  ?9) Hyperlipidemia:  ?- Continue Crestor. Zetia stopped due to cost.  ?  ?10) CAD ?- No s/s angina ?-  continue Crestor ?  ?11) CKD IV ?- Creatinine baseline 2.3 - 2.7 ?- Creatinine 2.4 >2.3>2.2>2.6 > 2.4 today ? ?12) Anemia, acute blood loss ?- Baseline Hgb 10-11 ?- Post-op, 10>9.3>7.7 -> 1u -> 8.1 ? ? ?Length of Stay: 5 ? ?Glori Bickers, MD ?12/06/2021, 11:14 AM ? ?VAD Team --- VAD ISSUES ONLY--- ?Pager [phone] (7am - 7am) ? ?Advanced Heart Failure Team  ?Pager [phone] (M-F; 7a - 5p)  ?Please contact [facility] for night-coverage after hours (5p -7a )  and weekends on [email or web address] ? ?Patient seen and examined with the above-signed Advanced Practice Provider and/or Housestaff. I personally reviewed laboratory data, imaging studies and relevant notes. I independently examined the patient and formulated the important aspects of the plan. I have edited the note to reflect any of my changes or salient points. I have personally discussed the plan with the patient and/or family. ? ?Remains lightheaded and orthostatic. Hgb has drifted down. R knee mildly sure. Scr up slightly. Midodrine added for low MAPs.  ? ?General:  sitting up. NAD.  ?HEENT: normal  ?Neck: supple. JVP not elevated.  Carotids 2+ bilat; no bruits. No lymphadenopathy or thryomegaly appreciated. ?Cor: LVAD hum.  ?Lungs: Clear. ?Abdomen: soft, nontender, non-distended. No hepatosplenomegaly. No bruits or masses. Good bowel sounds. Driveline site clean. Anchor in place.  ?Extremities: no cyanosis, clubbing, rash. Warm no edema  R knee wrapped ?Neuro: alert & oriented x 3. No focal deficits. Moves all 4 without problem  ? ?Remains orthostatic and anemic. Will give IVF and 1u RBCs. Remains on heparin/warfarin. Running heparin low. INR 1.3 Discussed dosing with PharmD personally. Continue midodrine. Watch renal function closely.  ? ?VAD interrogated personally. Parameters stable. ? ?Glori Bickers, MD  ?11:14 AM ? ? ? ? ? ? ?

## 2021-12-06 NOTE — Evaluation (Signed)
Occupational Therapy Evaluation ?Patient Details ?Name: Johnny Oneill ?MRN: [id number] ?DOB: 1940-09-18 ?Today's Date: 12/06/2021 ? ? ?History of Present Illness 81 yo admitted 4/3 with Rt knee OA for optimization prior to TKA performed 4/5. PMhx: CKD, CHF, HTN, LVAD HM II 2015, HLD, CAD, Lt TKA, ICD  ? ?Clinical Impression ?  ?Patient admitted for the procedure above.  PTA he lives at home with his spouse, who is able to provide needed ADL support.  Pain and dizziness with standing are his primary deficits.  Currently he is needing expected Mod A for lower body ADL, but should progress to baseline as pain subsides, and R knee ROM improves.  OT can follow in the acute setting to demonstrate pieces of the hip kit, encourage in room mobility/ADL performance, but no post acute OT is anticipated given support at home.     ?   ? ?Recommendations for follow up therapy are one component of a multi-disciplinary discharge planning process, led by the attending physician.  Recommendations may be updated based on patient status, additional functional criteria and insurance authorization.  ? ?Follow Up Recommendations ? No OT follow up  ?  ?Assistance Recommended at Discharge Intermittent Supervision/Assistance  ?Patient can return home with the following   ? ?  ?Functional Status Assessment ? Patient has had a recent decline in their functional status and demonstrates the ability to make significant improvements in function in a reasonable and predictable amount of time.  ?Equipment Recommendations ? Tub/shower seat  ?  ?Recommendations for Other Services   ? ? ?  ?Precautions / Restrictions Precautions ?Precautions: Knee;Fall;Other (comment) ?Precaution Comments: LVAD, watch HR and BP, symptomatic dizziness with standing. ?Restrictions ?Weight Bearing Restrictions: No  ? ?  ? ?Mobility Bed Mobility ?  ?  ?  ?  ?  ?  ?  ?General bed mobility comments: up in recliner ?  ? ?Transfers ?  ?  ?  ?  ?  ?  ?  ?  ?  ?  ?  ? ?  ?Balance  Overall balance assessment: Needs assistance ?Sitting-balance support: Feet supported ?Sitting balance-Leahy Scale: Good ?  ?  ?  ?  ?  ?  ?  ?  ?  ?  ?  ?  ?  ?  ?  ?  ?   ? ?ADL either performed or assessed with clinical judgement  ? ?ADL   ?  ?  ?Grooming: Wash/dry hands;Wash/dry face;Set up;Sitting ?  ?  ?  ?  ?  ?Upper Body Dressing : Set up;Standing ?  ?Lower Body Dressing: Moderate assistance;Sitting/lateral leans;Sit to/from stand ?  ?  ?Toilet Transfer Details (indicate cue type and reason): deferred ?  ?  ?  ?  ?  ?General ADL Comments: patient will have the needed assist at home for bathing and dressing.  ? ? ? ?Vision Patient Visual Report: No change from baseline ?   ?   ?Perception Perception ?Perception: Not tested ?  ?Praxis Praxis ?Praxis: Not tested ?  ? ?Pertinent Vitals/Pain Pain Assessment ?Pain Assessment: Faces ?Faces Pain Scale: Hurts even more ?Pain Location: Rt knee ?Pain Descriptors / Indicators: Aching, Guarding, Grimacing, Stabbing ?Pain Intervention(s): Monitored during session  ? ? ? ?Hand Dominance Right ?  ?Extremity/Trunk Assessment Upper Extremity Assessment ?Upper Extremity Assessment: Overall WFL for tasks assessed ?  ?Lower Extremity Assessment ?Lower Extremity Assessment: Defer to PT evaluation ?  ?Cervical / Trunk Assessment ?Cervical / Trunk Assessment: Normal ?  ?Communication Communication ?  Communication: No difficulties ?  ?Cognition Arousal/Alertness: Awake/alert ?Behavior During Therapy: [facility] for tasks assessed/performed ?Overall Cognitive Status: Within Functional Limits for tasks assessed ?  ?  ?  ?  ?  ?  ?  ?  ?  ?  ?  ?  ?  ?  ?  ?  ?  ?  ?  ?General Comments   Continues with dizziness upon standing.   ? ?  ?Exercises   ?  ?Shoulder Instructions    ? ? ?Home Living Family/patient expects to be discharged to:: Private residence ?Living Arrangements: Spouse/significant other ?Available Help at Discharge: Family;Available 24 hours/day ?Type of Home: House ?Home Access:  Stairs to enter ?Entrance Stairs-Number of Steps: 4 ?Entrance Stairs-Rails: None ?Home Layout: Two level;Able to live on main level with bedroom/bathroom ?Alternate Level Stairs-Number of Steps: Walk in shower on the 2nd level.  Bathtub only on the main level. ?  ?Bathroom Shower/Tub: Tub only;Walk-in shower ?  ?Bathroom Toilet: Standard ?Bathroom Accessibility: Yes ?How Accessible: Accessible via walker ?Home Equipment: Air cabin crew (4 wheels);BSC/3in1 ?  ?Additional Comments: Probably looking at sink baths until he can manage stairs. ?  ? ?  ?Prior Functioning/Environment Prior Level of Function : Independent/Modified Independent ?  ?  ?  ?  ?  ?  ?  ?ADLs Comments: No assist for ADL/IADL. ?  ? ?  ?  ?OT Problem List: Decreased range of motion;Impaired balance (sitting and/or standing);Pain ?  ?   ?OT Treatment/Interventions: Self-care/ADL training;Therapeutic activities;Balance training;Patient/family education  ?  ?OT Goals(Current goals can be found in the care plan section) Acute Rehab OT Goals ?Patient Stated Goal: Ready to go home when cleared medically ?OT Goal Formulation: With patient ?Time For Goal Achievement: 12/20/21 ?Potential to Achieve Goals: Good  ?OT Frequency: Min 2X/week ?  ? ?Co-evaluation   ?  ?  ?  ?  ? ?  ?AM-PAC OT "6 Clicks" Daily Activity     ?Outcome Measure Help from another person eating meals?: None ?Help from another person taking care of personal grooming?: None ?Help from another person toileting, which includes using toliet, bedpan, or urinal?: A Little ?Help from another person bathing (including washing, rinsing, drying)?: A Lot ?Help from another person to put on and taking off regular upper body clothing?: None ?Help from another person to put on and taking off regular lower body clothing?: A Lot ?6 Click Score: 19 ?  ?End of Session Nurse Communication: Mobility status ? ?Activity Tolerance: Patient tolerated treatment well ?Patient left: in chair;with call  bell/phone within reach;with family/visitor present ? ?OT Visit Diagnosis: Unsteadiness on feet (R26.81);Dizziness and giddiness (R42);Pain ?Pain - Right/Left: Right ?Pain - part of body: Knee  ?              ?Time: 3220-2542 ?OT Time Calculation (min): 19 min ?Charges:  OT General Charges ?$OT Visit: 1 Visit ?OT Evaluation ?$OT Eval Moderate Complexity: 1 Mod ? ?12/06/2021 ? ?RP, OTR/L ? ?Acute Rehabilitation Services ? ?Office:  [phone] ? ? ?Eddie Payette D Lucciano Vitali ?12/06/2021, 12:09 PM ?

## 2021-12-06 NOTE — Progress Notes (Signed)
ANTICOAGULATION CONSULT NOTE ? ?Pharmacy Consult for heparin and warfarin ?Indication: LVAD ? ?Allergies  ?Allergen Reactions  ? Ace Inhibitors   ?  Hypotension and elevated creatinine  ? Amiodarone Other (See Comments)  ?  Cerebellar side effects  ? Diovan [Valsartan] Other (See Comments)  ?  Hypotension at low dose, hyperkalemia  ? Lipitor [Atorvastatin Calcium] Other (See Comments)  ?  Muscle pain  ? Jardiance [Empagliflozin] Other (See Comments)  ?  Unsure of exact side effect  ? Norvasc [Amlodipine] Other (See Comments)  ?  Put patient to sleep  ? ? ?Patient Measurements: ?Height: 5\' 7"  (170.2 cm) ?Weight: 76 kg (167 lb 8.8 oz) ?IBW/kg (Calculated) : 66.1 ? Heparin dosing weight: 71.1 kg ? ?Vital Signs: ?Temp: 98.6 ?F (37 ?C) (04/08 1141) ?Temp Source: Oral (04/08 1141) ?BP: 88/76 (04/08 1141) ?Pulse Rate: 80 (04/08 1141) ? ?Labs: ?Recent Labs  ?  12/04/21 ?0137 12/04/21 ?2000 12/05/21 ?0053 12/05/21 ?1944 12/06/21 ?8119 12/06/21 ?1227  ?HGB 9.3*  --  7.7* 8.7* 8.1*  --   ?HCT 28.3*  --  23.9* 25.6* 24.7*  --   ?PLT 127*  --  131*  --  125*  --   ?LABPROT 15.5*  --  16.5*  --  15.5*  --   ?INR 1.2  --  1.3*  --  1.2  --   ?HEPARINUNFRC <0.10*   < > 0.15*  --  <0.10* 0.20*  ?CREATININE 2.21*  --  2.65*  --  2.44*  --   ? < > = values in this interval not displayed.  ? ? ? ?Estimated Creatinine Clearance: 23.3 mL/min (A) (by C-G formula based on SCr of 2.44 mg/dL (H)). ? ? ?Medical History: ?Past Medical History:  ?Diagnosis Date  ? Anxiety   ? Automatic implantable cardioverter-defibrillator in situ   ? CAD (coronary artery disease)   ? S/P stenting of LCX in 2004;  s/p Lat MI 2009 with occlusion of the LCX - treated with Promus stenting. Has total occlusion of the RCA.   ? CHF (congestive heart failure) ([place])   ? Hypercholesteremia   ? Hypertension   ? Hypothyroidism   ? ICD (implantable cardiac defibrillator) in place   ? PROPHYLACTIC      medtronic  ? Inferior MI ([place]) "? date"; 2009  ? Ischemic  cardiomyopathy   ? a. 10/09 Echo: Sev LV Dysfxn, inf/lat AK, Mod MR.  ? Liver disease   ? LVAD (left ventricular assist device) present ([place])   ? Osteoarthritis   ? a. s/p R TKR  ? Pacemaker   ? PVC (premature ventricular contraction)   ? RBBB   ? Shortness of breath   ? ? ?Medications:  ?Medications Prior to Admission  ?Medication Sig Dispense Refill Last Dose  ? acetaminophen (TYLENOL) 500 MG tablet Take 500 mg by mouth every 6 (six) hours as needed (pain).     ? aspirin EC 81 MG tablet Take 81 mg by mouth in the morning.     ? cholecalciferol (VITAMIN D3) 25 MCG (1000 UT) tablet Take 1,000 Units by mouth in the morning.     ? citalopram (CELEXA) 10 MG tablet Take 1 tablet by mouth once daily 90 tablet 3   ? Coenzyme Q10 (COQ10) 200 MG CAPS Take 200 mg by mouth in the morning.     ? famotidine (PEPCID) 20 MG tablet Take 20 mg by mouth in the morning.     ? furosemide (LASIX) 20 MG tablet  Take 1 tablet (20 mg total) by mouth daily as needed (Take 20 mg as daily as needed for wt > 153 lbs). Reported on 01/14/2016 (Patient not taking: Reported on 11/26/2021) 30 tablet 6   ? melatonin 5 MG TABS Take 5 mg by mouth at bedtime as needed (sleep).     ? mexiletine (MEXITIL) 150 MG capsule Take 1 capsule (150 mg total) by mouth 2 (two) times daily. 60 capsule 6   ? neomycin-bacitracin-polymyxin (NEOSPORIN) ointment Apply 1 application. topically as needed for wound care. (Patient not taking: Reported on 11/26/2021)     ? pantoprazole (PROTONIX) 40 MG tablet Take 1 tablet by mouth once daily 90 tablet 3   ? potassium chloride SA (KLOR-CON) 20 MEQ tablet Take 1 tablet (20 mEq total) by mouth daily as needed. Take one potassium pill when you take your lasix. (Patient not taking: Reported on 11/26/2021) 30 tablet 6   ? Probiotic Product (PROBIOTIC PO) Take 1 capsule by mouth in the morning.     ? sotalol (BETAPACE) 80 MG tablet TAKE 1 TABLET (80 MG TOTAL) BY MOUTH DAILY. 90 tablet 3   ? SYNTHROID 175 MCG tablet 1 tablet daily every  morning except half tablet on Sundays (Patient taking differently: 1 tablet daily every morning) 30 tablet 3   ? traMADol (ULTRAM) 50 MG tablet Take 1-2 tablets (50-100 mg total) by mouth every 6 (six) hours as needed for moderate pain. (Patient taking differently: Take 50-100 mg by mouth every 6 (six) hours as needed for moderate pain (knee pain).) 45 tablet 5   ? traZODone (DESYREL) 50 MG tablet Take 1 tablet (50 mg total) by mouth at bedtime. (Patient taking differently: Take 50 mg by mouth at bedtime as needed for sleep.) 30 tablet 5   ? rosuvastatin (CRESTOR) 20 MG tablet Take 1 tablet (20 mg total) by mouth daily. 90 tablet 3   ? warfarin (COUMADIN) 5 MG tablet Take 5 mg (1 tablet) every day OR as directed by [facility] 135 tablet 11 11/27/2021  ? ?Scheduled:  ? aspirin EC  81 mg Oral q AM  ? citalopram  10 mg Oral Daily  ? docusate sodium  100 mg Oral BID  ? levothyroxine  175 mcg Oral Q0600  ? mexiletine  150 mg Oral BID  ? midodrine  10 mg Oral TID WC  ? pantoprazole  40 mg Oral Daily  ? rosuvastatin  20 mg Oral QPM  ? sodium chloride flush  3 mL Intravenous Q12H  ? sotalol  80 mg Oral Daily  ? warfarin  7.5 mg Oral ONCE-1600  ? Warfarin - Pharmacist Dosing Inpatient   Does not apply M1962  ? ? ?Assessment: ?81 yo male on warfarin PTA for LVAD (placed 08/2013). Plans are for R TKR on 4/5 (last dose of warfarin given 3/30). INR 1.3 today. ?Pharmacy consulted to dose heparin while INR < 2 ? ?Heparin drip stopped preop 4/5  ?S/p TKR ?Per ortho ok to resume low drip rate heparin and warfarin  ?INR 1.3 after warfarin 5mg  x2 doses  ?Heparin drip increased this morning to 1100 uts/hr heparin level undetectable this am at < 0.1.   ?Per RN no IV issues with heparin administrations ?Will up titrate to goal ~0.3  ?LDH bump this am 144 > 290  ? 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  ?Plan:  ?Increase heparin drip 1200 uts/hr - no  bolus ?Check heparin level in 6hr  ?Warfarin 7.5mg  x1 scheduled for tonight ?Daily heparin level, Protime  and CBC ?Monitor s/s bleeding, thrombosis  ? ?Thank you for allowing pharmacy to participate in this patient's care. ? ?Reatha Harps, PharmD ?PGY1 Pharmacy Resident ?12/06/2021 2:20 PM ?Check [email or web address] for unit specific pharmacy number ? ? ? ? ? ?

## 2021-12-06 NOTE — Progress Notes (Signed)
ANTICOAGULATION CONSULT NOTE ? ?Pharmacy Consult for heparin and warfarin ?Indication: LVAD ? ?Allergies  ?Allergen Reactions  ? Ace Inhibitors   ?  Hypotension and elevated creatinine  ? Amiodarone Other (See Comments)  ?  Cerebellar side effects  ? Diovan [Valsartan] Other (See Comments)  ?  Hypotension at low dose, hyperkalemia  ? Lipitor [Atorvastatin Calcium] Other (See Comments)  ?  Muscle pain  ? Jardiance [Empagliflozin] Other (See Comments)  ?  Unsure of exact side effect  ? Norvasc [Amlodipine] Other (See Comments)  ?  Put patient to sleep  ? ? ?Patient Measurements: ?Height: 5\' 7"  (170.2 cm) ?Weight: 76 kg (167 lb 8.8 oz) ?IBW/kg (Calculated) : 66.1 ? Heparin dosing weight: 71.1 kg ? ?Vital Signs: ?Temp: 98.5 ?F (36.9 ?C) (04/08 2353) ?Temp Source: Oral (04/08 6144) ?BP: 105/89 (04/08 0743) ?Pulse Rate: 80 (04/08 0313) ? ?Labs: ?Recent Labs  ?  12/04/21 ?0137 12/04/21 ?2000 12/05/21 ?0053 12/05/21 ?1944 12/06/21 ?3154  ?HGB 9.3*  --  7.7* 8.7* 8.1*  ?HCT 28.3*  --  23.9* 25.6* 24.7*  ?PLT 127*  --  131*  --  125*  ?LABPROT 15.5*  --  16.5*  --  15.5*  ?INR 1.2  --  1.3*  --  1.2  ?HEPARINUNFRC <0.10* 0.16* 0.15*  --  <0.10*  ?CREATININE 2.21*  --  2.65*  --  2.44*  ? ? ? ?Estimated Creatinine Clearance: 23.3 mL/min (A) (by C-G formula based on SCr of 2.44 mg/dL (H)). ? ? ?Medical History: ?Past Medical History:  ?Diagnosis Date  ? Anxiety   ? Automatic implantable cardioverter-defibrillator in situ   ? CAD (coronary artery disease)   ? S/P stenting of LCX in 2004;  s/p Lat MI 2009 with occlusion of the LCX - treated with Promus stenting. Has total occlusion of the RCA.   ? CHF (congestive heart failure) ([place])   ? Hypercholesteremia   ? Hypertension   ? Hypothyroidism   ? ICD (implantable cardiac defibrillator) in place   ? PROPHYLACTIC      medtronic  ? Inferior MI ([place]) "? date"; 2009  ? Ischemic cardiomyopathy   ? a. 10/09 Echo: Sev LV Dysfxn, inf/lat AK, Mod MR.  ? Liver disease   ? LVAD (left  ventricular assist device) present ([place])   ? Osteoarthritis   ? a. s/p R TKR  ? Pacemaker   ? PVC (premature ventricular contraction)   ? RBBB   ? Shortness of breath   ? ? ?Medications:  ?Medications Prior to Admission  ?Medication Sig Dispense Refill Last Dose  ? acetaminophen (TYLENOL) 500 MG tablet Take 500 mg by mouth every 6 (six) hours as needed (pain).     ? aspirin EC 81 MG tablet Take 81 mg by mouth in the morning.     ? cholecalciferol (VITAMIN D3) 25 MCG (1000 UT) tablet Take 1,000 Units by mouth in the morning.     ? citalopram (CELEXA) 10 MG tablet Take 1 tablet by mouth once daily 90 tablet 3   ? Coenzyme Q10 (COQ10) 200 MG CAPS Take 200 mg by mouth in the morning.     ? famotidine (PEPCID) 20 MG tablet Take 20 mg by mouth in the morning.     ? furosemide (LASIX) 20 MG tablet Take 1 tablet (20 mg total) by mouth daily as needed (Take 20 mg as daily as needed for wt > 153 lbs). Reported on 01/14/2016 (Patient not taking: Reported on 11/26/2021) 30 tablet 6   ?  melatonin 5 MG TABS Take 5 mg by mouth at bedtime as needed (sleep).     ? mexiletine (MEXITIL) 150 MG capsule Take 1 capsule (150 mg total) by mouth 2 (two) times daily. 60 capsule 6   ? neomycin-bacitracin-polymyxin (NEOSPORIN) ointment Apply 1 application. topically as needed for wound care. (Patient not taking: Reported on 11/26/2021)     ? pantoprazole (PROTONIX) 40 MG tablet Take 1 tablet by mouth once daily 90 tablet 3   ? potassium chloride SA (KLOR-CON) 20 MEQ tablet Take 1 tablet (20 mEq total) by mouth daily as needed. Take one potassium pill when you take your lasix. (Patient not taking: Reported on 11/26/2021) 30 tablet 6   ? Probiotic Product (PROBIOTIC PO) Take 1 capsule by mouth in the morning.     ? sotalol (BETAPACE) 80 MG tablet TAKE 1 TABLET (80 MG TOTAL) BY MOUTH DAILY. 90 tablet 3   ? SYNTHROID 175 MCG tablet 1 tablet daily every morning except half tablet on Sundays (Patient taking differently: 1 tablet daily every morning) 30  tablet 3   ? traMADol (ULTRAM) 50 MG tablet Take 1-2 tablets (50-100 mg total) by mouth every 6 (six) hours as needed for moderate pain. (Patient taking differently: Take 50-100 mg by mouth every 6 (six) hours as needed for moderate pain (knee pain).) 45 tablet 5   ? traZODone (DESYREL) 50 MG tablet Take 1 tablet (50 mg total) by mouth at bedtime. (Patient taking differently: Take 50 mg by mouth at bedtime as needed for sleep.) 30 tablet 5   ? rosuvastatin (CRESTOR) 20 MG tablet Take 1 tablet (20 mg total) by mouth daily. 90 tablet 3   ? warfarin (COUMADIN) 5 MG tablet Take 5 mg (1 tablet) every day OR as directed by [facility] 135 tablet 11 11/27/2021  ? ?Scheduled:  ? aspirin EC  81 mg Oral q AM  ? citalopram  10 mg Oral Daily  ? docusate sodium  100 mg Oral BID  ? levothyroxine  175 mcg Oral Q0600  ? mexiletine  150 mg Oral BID  ? midodrine  5 mg Oral TID WC  ? pantoprazole  40 mg Oral Daily  ? rosuvastatin  20 mg Oral QPM  ? sodium chloride flush  3 mL Intravenous Q12H  ? sotalol  80 mg Oral Daily  ? warfarin  7.5 mg Oral ONCE-1600  ? Warfarin - Pharmacist Dosing Inpatient   Does not apply N8295  ? ? ?Assessment: ?81 yo male on warfarin PTA for LVAD (placed 08/2013). Plans are for R TKR on 4/5 (last dose of warfarin given 3/30). INR 1.3 today. ?Pharmacy consulted to dose heparin while INR < 2 ? ?Heparin drip stopped preop 4/5  ?S/p TKR ?Per ortho ok to resume low drip rate heparin and warfarin  ?INR 1.3 after warfarin 5mg  x2doses  ?Heparin driip increased yesterday to 900 uts/hr heparin level undetectable this am at < 0.1  ?Confirmed with patient and with RN no IV issues overnight no infiltrate, h/h stable  ? will up titrate to goal 0.3  ?LDH bump this am 200 > 290  ? 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  ?Plan:  ?Increase heparin drip 1100 uts/hr - no bolus ?Check heparin level in 6hr  ?Warfarin 7.5mg  x1  ?Daily heparin level,  Protime  and CBC ?Monitor s/s bleeding, thrombosis  ? ? ?Bonnita Nasuti Pharm.D. CPP, BCPS ?Clinical Pharmacist ?[phone] ?  12/06/2021 7:49 AM  ? ?Please check AMION for all [place] numbers ?12/06/2021 ? ? ? ? ? ?

## 2021-12-07 DIAGNOSIS — I5022 Chronic systolic (congestive) heart failure: Secondary | ICD-10-CM | POA: Diagnosis not present

## 2021-12-07 DIAGNOSIS — Z95811 Presence of heart assist device: Secondary | ICD-10-CM | POA: Diagnosis not present

## 2021-12-07 DIAGNOSIS — M158 Other polyosteoarthritis: Secondary | ICD-10-CM | POA: Diagnosis not present

## 2021-12-07 LAB — PROTIME-INR
INR: 1.3 — ABNORMAL HIGH (ref 0.8–1.2)
Prothrombin Time: 15.6 seconds — ABNORMAL HIGH (ref 11.4–15.2)

## 2021-12-07 LAB — BASIC METABOLIC PANEL
Anion gap: 8 (ref 5–15)
BUN: 39 mg/dL — ABNORMAL HIGH (ref 8–23)
CO2: 22 mmol/L (ref 22–32)
Calcium: 8.6 mg/dL — ABNORMAL LOW (ref 8.9–10.3)
Chloride: 102 mmol/L (ref 98–111)
Creatinine, Ser: 2.27 mg/dL — ABNORMAL HIGH (ref 0.61–1.24)
GFR, Estimated: 29 mL/min — ABNORMAL LOW (ref >60)
Glucose, Bld: 116 mg/dL — ABNORMAL HIGH (ref 70–99)
Potassium: 5 mmol/L (ref 3.5–5.1)
Sodium: 132 mmol/L — ABNORMAL LOW (ref 135–145)

## 2021-12-07 LAB — CBC
HCT: 24.8 % — ABNORMAL LOW (ref 39.0–52.0)
Hemoglobin: 8.3 g/dL — ABNORMAL LOW (ref 13.0–17.0)
MCH: 31.3 pg (ref 26.0–34.0)
MCHC: 33.5 g/dL (ref 30.0–36.0)
MCV: 93.6 fL (ref 80.0–100.0)
Platelets: 143 10*3/uL — ABNORMAL LOW (ref 150–400)
RBC: 2.65 MIL/uL — ABNORMAL LOW (ref 4.22–5.81)
RDW: 14.9 % (ref 11.5–15.5)
WBC: 10 10*3/uL (ref 4.0–10.5)
nRBC: 0 % (ref 0.0–0.2)

## 2021-12-07 LAB — HEPARIN LEVEL (UNFRACTIONATED): Heparin Unfractionated: 0.3 IU/mL (ref 0.30–0.70)

## 2021-12-07 LAB — LACTATE DEHYDROGENASE: LDH: 203 U/L — ABNORMAL HIGH (ref 98–192)

## 2021-12-07 LAB — MAGNESIUM: Magnesium: 2.1 mg/dL (ref 1.7–2.4)

## 2021-12-07 MED ORDER — WARFARIN SODIUM 7.5 MG PO TABS
7.5000 mg | ORAL_TABLET | Freq: Once | ORAL | Status: AC
  Administered 2021-12-07: 7.5 mg via ORAL
  Filled 2021-12-07: qty 1

## 2021-12-07 NOTE — Progress Notes (Signed)
Pt is due for his weekly dressing change for LVAD site today,pt's wife said she will do it tomorrow as pt is too much exhausted today due to working with PT. RN offered to change but pt's refused saying his wife will change it tomorrow. Dressing kit is in bed side. ? ?Palma Holter, RN ?

## 2021-12-07 NOTE — Progress Notes (Signed)
Doughnut hole pillow provided and and pt is using it sitting in a recliner. ?

## 2021-12-07 NOTE — Progress Notes (Addendum)
Physical Therapy Treatment ?Patient Details ?Name: Johnny Oneill ?MRN: [id number] ?DOB: Apr 06, 1941 ?Today's Date: 12/07/2021 ? ? ?History of Present Illness 81 yo admitted 4/3 with Rt knee OA for optimization prior to TKA performed 4/5. PMhx: CKD, CHF, HTN, LVAD HM II 2015, HLD, CAD, Lt TKA, ICD ? ?  ?PT Comments  ? ? Pt progressing incrementally towards his physical therapy goals. Session focused on bed level exercises for right knee strengthening/ROM and gait training. Pt ambulating 80 ft with a walker and up to min assist for balance, utilizing a step to pattern. BP 96/51 (62) standing, 97/55 (76) sitting post walk, HR 82-88, with brief episode of nonsustained vtach 120 when repositioning in chair. Will need continued gait and stair training prior to discharge home. ?   ?Recommendations for follow up therapy are one component of a multi-disciplinary discharge planning process, led by the attending physician.  Recommendations may be updated based on patient status, additional functional criteria and insurance authorization. ? ?Follow Up Recommendations ? Follow physician's recommendations for discharge plan and follow up therapies ?  ?  ?Assistance Recommended at Discharge Frequent or constant Supervision/Assistance  ?Patient can return home with the following A little help with walking and/or transfers;A little help with bathing/dressing/bathroom;Assistance with cooking/housework;Assist for transportation ?  ?Equipment Recommendations ? Rolling walker (2 wheels)  ?  ?Recommendations for Other Services   ? ? ?  ?Precautions / Restrictions Precautions ?Precautions: Knee;Fall;Other (comment) ?Precaution Comments: LVAD, watch HR and BP ?Restrictions ?Weight Bearing Restrictions: No  ?  ? ?Mobility ? Bed Mobility ?Overal bed mobility: Needs Assistance ?Bed Mobility: Supine to Sit ?  ?  ?Supine to sit: Min assist ?  ?  ?General bed mobility comments: Increased time, assist for RLE negotiation off edge of bed ?   ? ?Transfers ?Overall transfer level: Needs assistance ?Equipment used: Rolling walker (2 wheels) ?Transfers: Sit to/from Stand ?Sit to Stand: Min assist ?  ?  ?  ?  ?  ?General transfer comment: MinA to power up from edge of bed and steady, increased time to rise, initial posterior lean. Cues for pushing down through walker ?  ? ?Ambulation/Gait ?Ambulation/Gait assistance: Min guard, Min assist ?Gait Distance (Feet): 80 Feet ?Assistive device: Rolling walker (2 wheels) ?Gait Pattern/deviations: Step-to pattern, Decreased stance time - right, Decreased dorsiflexion - right, Decreased weight shift to right, Decreased step length - left, Antalgic, Step-through pattern ?Gait velocity: decreased ?  ?  ?General Gait Details: Pt with initial posterior lean, requiring minA to promote anterior weight shift. Cues for pushing down through arms on walker. Progressing to min guard assist. Cues for R heel strike at initial contact, decreased R step length, and step through pattern, but pt unable to consistently perform ? ? ?Stairs ?  ?  ?  ?  ?  ? ? ?Wheelchair Mobility ?  ? ?Modified Rankin (Stroke Patients Only) ?  ? ? ?  ?Balance Overall balance assessment: Needs assistance ?Sitting-balance support: Feet supported ?Sitting balance-Leahy Scale: Good ?  ?  ?Standing balance support: Bilateral upper extremity supported ?Standing balance-Leahy Scale: Poor ?Standing balance comment: bil Ue support on RW for standing ?  ?  ?  ?  ?  ?  ?  ?  ?  ?  ?  ?  ? ?  ?Cognition Arousal/Alertness: Awake/alert ?Behavior During Therapy: [facility] for tasks assessed/performed ?Overall Cognitive Status: Within Functional Limits for tasks assessed ?  ?  ?  ?  ?  ?  ?  ?  ?  ?  ?  ?  ?  ?  ?  ?  ?  ?  ?  ? ?  ?  Exercises Total Joint Exercises ?Ankle Circles/Pumps: Both, 20 reps, Supine ?Quad Sets: 10 reps, Supine, Right ?Heel Slides: AAROM, Right, 5 reps, Supine ?Hip ABduction/ADduction: AAROM, Right, 10 reps, Supine ? ?  ?General Comments   ?  ?   ? ?Pertinent Vitals/Pain Pain Assessment ?Pain Assessment: Faces ?Faces Pain Scale: Hurts even more ?Pain Location: Rt knee ?Pain Descriptors / Indicators: Aching, Guarding ?Pain Intervention(s): Monitored during session  ? ? ?Home Living   ?  ?  ?  ?  ?  ?  ?  ?  ?  ?   ?  ?Prior Function    ?  ?  ?   ? ?PT Goals (current goals can now be found in the care plan section) Acute Rehab PT Goals ?Potential to Achieve Goals: Good ?Progress towards PT goals: Progressing toward goals ? ?  ?Frequency ? ? ? 7X/week ? ? ? ?  ?PT Plan Current plan remains appropriate  ? ? ?Co-evaluation   ?  ?  ?  ?  ? ?  ?AM-PAC PT "6 Clicks" Mobility   ?Outcome Measure ? Help needed turning from your back to your side while in a flat bed without using bedrails?: A Little ?Help needed moving from lying on your back to sitting on the side of a flat bed without using bedrails?: A Little ?Help needed moving to and from a bed to a chair (including a wheelchair)?: A Little ?Help needed standing up from a chair using your arms (e.g., wheelchair or bedside chair)?: A Little ?Help needed to walk in hospital room?: A Little ?Help needed climbing 3-5 steps with a railing? : A Lot ?6 Click Score: 17 ? ?  ?End of Session Equipment Utilized During Treatment: Gait belt ?Activity Tolerance: Patient tolerated treatment well ?Patient left: with call bell/phone within reach;in bed ?Nurse Communication: Mobility status ?PT Visit Diagnosis: Other abnormalities of gait and mobility (R26.89);Difficulty in walking, not elsewhere classified (R26.2);Muscle weakness (generalized) (M62.81) ?  ? ? ?Time: 1245-8099 ?PT Time Calculation (min) (ACUTE ONLY): 40 min ? ?Charges:  $Gait Training: 23-37 mins ?$Therapeutic Activity: 8-22 mins          ?          ? ?Wyona Almas, PT, DPT ?Acute Rehabilitation Services ?Pager [phone] ?Office [phone] ? ? ? ?Deno Etienne ?12/07/2021, 10:11 AM ? ?

## 2021-12-07 NOTE — Progress Notes (Addendum)
Physical Therapy Treatment ?Patient Details ?Name: Johnny Oneill ?MRN: [id number] ?DOB: [birth year] ?Today's Date: 12/07/2021 ? ? ?History of Present Illness 81 yo admitted 4/3 with Rt knee OA for optimization prior to TKA performed 4/5. PMhx: CKD, CHF, HTN, LVAD HM II 2015, HLD, CAD, Lt TKA, ICD ? ?  ?PT Comments  ? ? Pt progressing steadily towards his physical therapy goals this PM session. Pt reporting buttocks pain while sitting in chair with donut pillow; RN notified for pain medication per pt request and noted blanchable redness. Recommended decreasing amount of time sitting in chair overall with pressure relief schedule. Pt able to ambulate to bathroom and have BM. Pt ambulating an additional 100 ft with a walker, utilizing a step to pattern and slow pace. Negotiated 2 steps with a railing to prepare for discharge home. VSS throughout. Will continue to progress as tolerated.  ?   ?Recommendations for follow up therapy are one component of a multi-disciplinary discharge planning process, led by the attending physician.  Recommendations may be updated based on patient status, additional functional criteria and insurance authorization. ? ?Follow Up Recommendations ? Follow physician's recommendations for discharge plan and follow up therapies ?  ?  ?Assistance Recommended at Discharge Frequent or constant Supervision/Assistance  ?Patient can return home with the following A little help with walking and/or transfers;A little help with bathing/dressing/bathroom;Assistance with cooking/housework;Assist for transportation ?  ?Equipment Recommendations ? Rolling walker (2 wheels)  ?  ?Recommendations for Other Services   ? ? ?  ?Precautions / Restrictions Precautions ?Precautions: Knee;Fall;Other (comment) ?Precaution Comments: LVAD, watch HR and BP ?Restrictions ?Weight Bearing Restrictions: No  ?  ? ?Mobility ? Bed Mobility ?Overal bed mobility: Needs Assistance ?Bed Mobility: Sit to Supine ?  ?  ?Supine to sit: Min  assist ?Sit to supine: Min assist ?  ?General bed mobility comments: Assist for RLE negotiation back into bed, cues for initiation ?  ? ?Transfers ?Overall transfer level: Needs assistance ?Equipment used: Rolling walker (2 wheels) ?Transfers: Sit to/from Stand ?Sit to Stand: Min assist ?  ?  ?  ?  ?  ?General transfer comment: Light minA to power up, increased time to achieve glute/quad activation ?  ? ?Ambulation/Gait ?Ambulation/Gait assistance: Min guard ?Gait Distance (Feet): 100 Feet ?Assistive device: Rolling walker (2 wheels) ?Gait Pattern/deviations: Step-to pattern, Decreased stance time - right, Decreased dorsiflexion - right, Decreased weight shift to right, Decreased step length - left, Antalgic, Step-through pattern ?Gait velocity: decreased ?Gait velocity interpretation: <1.8 ft/sec, indicate of risk for recurrent falls ?  ?General Gait Details: Cues for R heel strike, decreased R step length, and step through pattern, but pt unable to consistently achieve. very slow pace. ? ? ?Stairs ?Stairs: Yes ?Stairs assistance: Min guard ?Stair Management: One rail Right, Sideways ?Number of Stairs: 2 ?General stair comments: cues for technique ? ? ?Wheelchair Mobility ?  ? ?Modified Rankin (Stroke Patients Only) ?  ? ? ?  ?Balance Overall balance assessment: Needs assistance ?Sitting-balance support: Feet supported ?Sitting balance-Leahy Scale: Good ?  ?  ?Standing balance support: Bilateral upper extremity supported ?Standing balance-Leahy Scale: Poor ?Standing balance comment: bil Ue support on RW for standing ?  ?  ?  ?  ?  ?  ?  ?  ?  ?  ?  ?  ? ?  ?Cognition Arousal/Alertness: Awake/alert ?Behavior During Therapy: [facility] for tasks assessed/performed ?Overall Cognitive Status: Within Functional Limits for tasks assessed ?  ?  ?  ?  ?  ?  ?  ?  ?  ?  ?  ?  ?  ?  ?  ?  ?  ?  ?  ? ?  ?  Exercises Total Joint Exercises ?Ankle Circles/Pumps: Both, 20 reps, Supine ?Quad Sets: 10 reps, Supine, Right ?Heel Slides:  AAROM, Right, 10 reps, Seated ?Hip ABduction/ADduction: AAROM, Right, 10 reps, Supine ?Long Arc Quad: AAROM, Right, 10 reps, Seated ?Goniometric ROM: 0-80 deg ? ?  ?General Comments   ?  ?  ? ?Pertinent Vitals/Pain Pain Assessment ?Pain Assessment: Faces ?Faces Pain Scale: Hurts whole lot ?Pain Location: Rt knee, bottom ?Pain Descriptors / Indicators: Aching, Guarding ?Pain Intervention(s): Limited activity within patient's tolerance, Monitored during session, Repositioned  ? ? ?Home Living   ?  ?  ?  ?  ?  ?  ?  ?  ?  ?   ?  ?Prior Function    ?  ?  ?   ? ?PT Goals (current goals can now be found in the care plan section) Acute Rehab PT Goals ?Potential to Achieve Goals: Good ?Progress towards PT goals: Progressing toward goals ? ?  ?Frequency ? ? ? 7X/week ? ? ? ?  ?PT Plan Current plan remains appropriate  ? ? ?Co-evaluation   ?  ?  ?  ?  ? ?  ?AM-PAC PT "6 Clicks" Mobility   ?Outcome Measure ? Help needed turning from your back to your side while in a flat bed without using bedrails?: A Little ?Help needed moving from lying on your back to sitting on the side of a flat bed without using bedrails?: A Little ?Help needed moving to and from a bed to a chair (including a wheelchair)?: A Little ?Help needed standing up from a chair using your arms (e.g., wheelchair or bedside chair)?: A Little ?Help needed to walk in hospital room?: A Little ?Help needed climbing 3-5 steps with a railing? : A Lot ?6 Click Score: 17 ? ?  ?End of Session Equipment Utilized During Treatment: Gait belt ?Activity Tolerance: Patient tolerated treatment well ?Patient left: with call bell/phone within reach;in bed ?Nurse Communication: Mobility status ?PT Visit Diagnosis: Other abnormalities of gait and mobility (R26.89);Difficulty in walking, not elsewhere classified (R26.2);Muscle weakness (generalized) (M62.81) ?  ? ? ?Time: 6759-1638 ?PT Time Calculation (min) (ACUTE ONLY): 63 min ? ?Charges:  $Gait Training: 23-37 mins ?$Therapeutic  Activity: 23-37 mins          ?          ? ?Wyona Almas, PT, DPT ?Acute Rehabilitation Services ?Pager [phone] ?Office [phone] ? ? ? ?Deno Etienne ?12/07/2021, 4:09 PM ? ?

## 2021-12-07 NOTE — Progress Notes (Signed)
?Advanced Heart Failure VAD Team Note ? ?PCP-Cardiologist: None  ? ?Subjective:   ? ?- S/p R total knee replacement 04/05 ?- 4/7 transfused 1u RBCs  ? ?On heparin gtt. HL 0.3  INR 1.3 ? ?Was very orthostatic yesterday. Improved with compression hose and increasing midodrine to 10 tid.  ? ?Feels better today. Able to walk with PT without orthostasis. Knee sore but ROM improving. Hgb stable.  ? ?Hgb stable at 8.3. LDH 203 SCr improved at 2.3 ? ? ?LVAD INTERROGATION:  ?HeartMate II LVAD:   ?Flow 5.8 liters/min, speed 9600, power 6.0 PI 5.9 ? ?Objective:   ? ?Vital Signs:   ?Temp:  [98.4 ?F (36.9 ?C)-98.9 ?F (37.2 ?C)] 98.7 ?F (37.1 ?C) (04/09 9211) ?Pulse Rate:  [71-81] 71 (04/09 0717) ?Resp:  [12-18] 16 (04/09 0717) ?BP: (79-120)/(65-99) 120/99 (04/09 0717) ?SpO2:  [93 %-96 %] 96 % (04/09 0717) ?Weight:  [77.2 kg] 77.2 kg (04/09 0407) ?  ?Mean arterial Pressure  90s at rest -> 50s with standing ?Intake/Output:  ? ?Intake/Output Summary (Last 24 hours) at 12/07/2021 1036 ?Last data filed at 12/07/2021 0630 ?Gross per 24 hour  ?Intake 480 ml  ?Output 1300 ml  ?Net -820 ml  ? ?  ? ?Physical Exam  ? ?General:  NAD.  ?HEENT: normal  ?Neck: supple. JVP not elevated.  Carotids 2+ bilat; no bruits. No lymphadenopathy or thryomegaly appreciated. ?Cor: LVAD hum.  ?Lungs: Clear. ?Abdomen:  soft, nontender, non-distended. No hepatosplenomegaly. No bruits or masses. Good bowel sounds. Driveline site clean. Anchor in place.  ?Extremities: no cyanosis, clubbing, rash. Warm no edema  + TEDs R Knee slightly swollen ?Neuro: alert & oriented x 3. No focal deficits. Moves all 4 without problem  ? ?Telemetry  ? ?AV paced 80s Personally reviewed ? ?Labs  ? ?Basic Metabolic Panel: ?Recent Labs  ?Lab 12/01/21 ?1646 12/02/21 ?9417 12/03/21 ?4081 12/04/21 ?4481 12/05/21 ?0053 12/06/21 ?8563 12/07/21 ?0108  ?NA 136   < > 137 136 135 132* 132*  ?K 5.1   < > 3.7 4.7 4.2 5.1 5.0  ?CL 107   < > 108 108 103 103 102  ?CO2 23   < > 22 22 26 23 22    ?GLUCOSE 106*   < > 214* 150* 136* 116* 116*  ?BUN 41*   < > 35* 29* 34* 39* 39*  ?CREATININE 2.49*   < > 2.33* 2.21* 2.65* 2.44* 2.27*  ?CALCIUM 8.9   < > 8.9 8.7* 8.6* 8.5* 8.6*  ?MG 1.9  --   --  1.7 2.6* 2.2 2.1  ? < > = values in this interval not displayed.  ? ? ? ?Liver Function Tests: ?No results for input(s): AST, ALT, ALKPHOS, BILITOT, PROT, ALBUMIN in the last 168 hours. ?No results for input(s): LIPASE, AMYLASE in the last 168 hours. ?No results for input(s): AMMONIA in the last 168 hours. ? ?CBC: ?Recent Labs  ?Lab 12/03/21 ?0056 12/04/21 ?0137 12/05/21 ?0053 12/05/21 ?1944 12/06/21 ?1497 12/07/21 ?0108  ?WBC 4.8 13.7* 11.1*  --  11.0* 10.0  ?HGB 10.0* 9.3* 7.7* 8.7* 8.1* 8.3*  ?HCT 30.4* 28.3* 23.9* 25.6* 24.7* 24.8*  ?MCV 91.8 92.5 94.5  --  93.2 93.6  ?PLT 157 127* 131*  --  125* 143*  ? ? ? ?INR: ?Recent Labs  ?Lab 12/03/21 ?0056 12/04/21 ?0137 12/05/21 ?0053 12/06/21 ?0263 12/07/21 ?0108  ?INR 1.3* 1.2 1.3* 1.2 1.3*  ? ? ? ?Other results: ? ? ? ?Imaging  ? ?No results found. ? ? ?  Medications:   ? ? ?Scheduled Medications: ? aspirin EC  81 mg Oral q AM  ? citalopram  10 mg Oral Daily  ? docusate sodium  100 mg Oral BID  ? levothyroxine  175 mcg Oral Q0600  ? mexiletine  150 mg Oral BID  ? midodrine  10 mg Oral TID WC  ? pantoprazole  40 mg Oral Daily  ? rosuvastatin  20 mg Oral QPM  ? sodium chloride flush  3 mL Intravenous Q12H  ? sotalol  80 mg Oral Daily  ? Warfarin - Pharmacist Dosing Inpatient   Does not apply [id number]  ? ? ?Infusions: ? sodium chloride    ? heparin 1,200 Units/hr (12/06/21 1438)  ? ? ?PRN Medications: ?sodium chloride, acetaminophen, HYDROmorphone (DILAUDID) injection, oxyCODONE **OR** HYDROmorphone, magnesium hydroxide, melatonin, menthol-cetylpyridinium **OR** phenol, metoCLOPramide **OR** metoCLOPramide (REGLAN) injection, ondansetron (ZOFRAN) IV, polyethylene glycol, senna, sorbitol, zolpidem ? ? ?Patient Profile  ?Johnny Oneill is a 32 year with a h/o CAD, chronic HFrEF with  HMII LVAD, VT, A flutter, hypothyroidism, HTN, CKD Stage IV, and OA. 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  ? ?Scheduled admit for TKR ? ? ?Assessment/Plan:   ? 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 Therapy limited by orthostasis ?- Improved on midodrine + compression hose ? ?2) Orthostatic hypotension ?- suspect vasoplegia ?- volume replete ?- improved today. Continue midodrine ?- compression hose ?  ?3) Chronic systolic HF: ICM, EF 21-30% s/p LVAD HM II implant 08/2013 for DT.   ?- Echo 3/18 EF 10-15% mild AI. Mod RV dysfunction. LVAD cannula OK ?- has struggled with RV failure however RHC 11/22 looked good (despite evidence of RVF on LE dopplers showing reflux ?- Volume status replete (maybe even a bit high) ?- Remains off all diuretics including spiro ?- Failed Jardiance due to volume depletion ?- He has not tolerated ACE-I or amlodipine or b-blocker in the past. ?  ?4) LVAD: HMII 08/2013 for DT. ?-  Admitted in May 2018 with elevated LDH with bival.  ?- Continue aspirin. ?- Off coumadin for TKR. Restart heparin gtt and keep on lower end of therapeutic range, resume coumadin with dosing per PharmD. ?- INR 1.2 Continue heparin/warfarin Discussed dosing with PharmD personally. ?- LDH 282>172>200>144 -> 200 ?- MAPS are orthostatic. Management as above ?  ?5) VT/Frequent PVCs ?- now s/p ICD gen change.  ?- Off amio due to cerebellar side effects.  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 Now on sotalol+ mexiletine.  ?- 06/16/21 recurrent VT. DC-CV x 1 ?- 5 minute run WCT 04/05 ? VT/AFL. Self terminated. ?- Keep K > 4 and Mag > 2 ?  ?6) Paroxysmal Atrial flutter ?- patient paced out of AFL in 6/22. Atrial therapies enabled  ?- continue sotalol (on for VT). QTs slightly prolonged but stable ?- may need to consider ablation if recurs ?- AV paced 80s, but intermittently V paced with rates 110s (brief). ? D/t pain. ?  ?7) Chronic anticoagulation: INR goal  2.0-3.0.    ?- INR 1.3  Plan as above Discussed dosing with PharmD personally. ?  ?8) Hypothyroidism:  ?- Followed by Dr Dwyane Dee.  ?- Continue levothyroxine.  ?  ?9) Hyperlipidemia:  ?- Continue Crestor. Zetia stopped due to cost.  ?  ?10) CAD ?- No ss/s angina ?- continue Crestor ?  ?11) CKD IV ?- Creatinine baseline 2.3 - 2.7 ?- Creatinine 2.4 >2.3>2.2>2.6 > 2.4 >  2.27 today ? ?12) Anemia, acute blood loss ?- Baseline Hgb 10-11 ?- Stable at 8.3 today ? ? ?Length of Stay: 6 ? ?Johnny Bickers, MD ?12/07/2021, 10:36 AM ? ?VAD Team --- VAD ISSUES ONLY--- ?Pager [phone] (7am - 7am) ? ?Advanced Heart Failure Team  ?Pager [phone] (M-F; 7a - 5p)  ?Please contact [facility] for night-coverage after hours (5p -7a ) and weekends on [email or web address] ? ?Patient seen and examined with the above-signed Advanced Practice Provider and/or Housestaff. I personally reviewed laboratory data, imaging studies and relevant notes. I independently examined the patient and formulated the important aspects of the plan. I have edited the note to reflect any of my changes or salient points. I have personally discussed the plan with the patient and/or family. ? ?Remains lightheaded and orthostatic. Hgb has drifted down. R knee mildly sure. Scr up slightly. Midodrine added for low MAPs.  ? ?General:  sitting up. NAD.  ?HEENT: normal  ?Neck: supple. JVP not elevated.  Carotids 2+ bilat; no bruits. No lymphadenopathy or thryomegaly appreciated. ?Cor: LVAD hum.  ?Lungs: Clear. ?Abdomen: soft, nontender, non-distended. No hepatosplenomegaly. No bruits or masses. Good bowel sounds. Driveline site clean. Anchor in place.  ?Extremities: no cyanosis, clubbing, rash. Warm no edema  R knee wrapped ?Neuro: alert & oriented x 3. No focal deficits. Moves all 4 without problem  ? ?Remains orthostatic and anemic. Will give IVF and 1u RBCs. Remains on heparin/warfarin. Running heparin low. INR 1.3 Discussed dosing with PharmD personally. Continue midodrine. Watch  renal function closely.  ? ?VAD interrogated personally. Parameters stable. ? ?Johnny Bickers, MD  ?10:36 AM ? ? ? ? ? ? ?

## 2021-12-07 NOTE — Progress Notes (Addendum)
ANTICOAGULATION CONSULT NOTE ?Pharmacy Consult for heparin  ?Indication: LVAD ?Brief A/P: Continue Heparin at current rate  ? ?Allergies  ?Allergen Reactions  ? Ace Inhibitors   ?  Hypotension and elevated creatinine  ? Amiodarone Other (See Comments)  ?  Cerebellar side effects  ? Diovan [Valsartan] Other (See Comments)  ?  Hypotension at low dose, hyperkalemia  ? Lipitor [Atorvastatin Calcium] Other (See Comments)  ?  Muscle pain  ? Jardiance [Empagliflozin] Other (See Comments)  ?  Unsure of exact side effect  ? Norvasc [Amlodipine] Other (See Comments)  ?  Put patient to sleep  ? ? ?Patient Measurements: ?Height: 5\' 7"  (170.2 cm) ?Weight: 77.2 kg (170 lb 3.1 oz) ?IBW/kg (Calculated) : 66.1 ? Heparin dosing weight: 71.1 kg ? ?Vital Signs: ?Temp: 98.7 ?F (37.1 ?C) (04/09 1144) ?Temp Source: Oral (04/09 1144) ?BP: 106/88 (04/09 1200) ?Pulse Rate: 71 (04/09 1144) ? ?Labs: ?Recent Labs  ?  12/05/21 ?0053 12/05/21 ?1944 12/06/21 ?5643 12/06/21 ?1227 12/06/21 ?2300 12/07/21 ?0108  ?HGB 7.7* 8.7* 8.1*  --   --  8.3*  ?HCT 23.9* 25.6* 24.7*  --   --  24.8*  ?PLT 131*  --  125*  --   --  143*  ?LABPROT 16.5*  --  15.5*  --   --  15.6*  ?INR 1.3*  --  1.2  --   --  1.3*  ?HEPARINUNFRC 0.15*  --  <0.10* 0.20* 0.26* 0.30  ?CREATININE 2.65*  --  2.44*  --   --  2.27*  ? ? ? ?Estimated Creatinine Clearance: 25.1 mL/min (A) (by C-G formula based on SCr of 2.27 mg/dL (H)). ? ? ?Assessment: ?81 yo male on warfarin PTA for LVAD (placed 08/2013). Pharmacy consulted to dose heparin while INR < 2. Plans are for R TKR on 4/5 (last dose of warfarin given 3/30). INR 1.3 today and heparin level therapeutic at 0.3. H/H and platelets are stable. LDH 203.  ? ?PTA warfarin dose: 5 mg daily ? 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  ?Plan:  ?Continue Heparin at 1200 u/hr ?Give warfarin 7.5 mg tonight ?Daily heparin level, CBC, and INR ? ?Thank you for allowing  pharmacy to participate in this patient's care. ? ?Reatha Harps, PharmD ?PGY1 Pharmacy Resident ?12/07/2021 12:36 PM ?Check [email or web address] for unit specific pharmacy number ? ? ? ? ? ? ? ?

## 2021-12-07 NOTE — Progress Notes (Signed)
Subjective: ?4 Days Post-Op Procedure(s) (LRB): ?TOTAL KNEE ARTHROPLASTY (Right) ? ?Patient walked this morning with therapy. He did get tired towards the end. Slept better last night. Pain controlled.  ? ?Objective: ?Vital signs in last 24 hours: ?Temp:  [98.4 ?F (36.9 ?C)-98.9 ?F (37.2 ?C)] 98.7 ?F (37.1 ?C) (04/09 9735) ?Pulse Rate:  [71-81] 71 (04/09 0717) ?Resp:  [12-18] 16 (04/09 0717) ?BP: (79-120)/(65-99) 120/99 (04/09 0717) ?SpO2:  [93 %-96 %] 96 % (04/09 0717) ?Weight:  [77.2 kg] 77.2 kg (04/09 0407) ? ?Intake/Output from previous day: ?04/08 0701 - 04/09 0700 ?In: 680 [P.O.:680] ?Out: 1500 [Urine:1500] ?Intake/Output this shift: ?No intake/output data recorded. ? ?Recent Labs  ?  12/05/21 ?0053 12/05/21 ?1944 12/06/21 ?3299 12/07/21 ?0108  ?HGB 7.7* 8.7* 8.1* 8.3*  ? ?Recent Labs  ?  12/06/21 ?2426 12/07/21 ?0108  ?WBC 11.0* 10.0  ?RBC 2.65* 2.65*  ?HCT 24.7* 24.8*  ?PLT 125* 143*  ? ?Recent Labs  ?  12/06/21 ?8341 12/07/21 ?0108  ?NA 132* 132*  ?K 5.1 5.0  ?CL 103 102  ?CO2 23 22  ?BUN 39* 39*  ?CREATININE 2.44* 2.27*  ?GLUCOSE 116* 116*  ?CALCIUM 8.5* 8.6*  ? ?Recent Labs  ?  12/06/21 ?9622 12/07/21 ?0108  ?INR 1.2 1.3*  ? ? ?Sensation intact distally ?Dorsiflexion/Plantar flexion intact ?Incision: dressing C/D/I ? ? ?Assessment/Plan: ?4 Days Post-Op Procedure(s) (LRB): ?TOTAL KNEE ARTHROPLASTY (Right) ?Up with therapy  ?WBAT ?Encourage ROM knee CPM machine ?leg extension pillow if tolerated. ?Pain control oxycodone, tylenol ? ?Appreciate excellent management with cards/med dispo will be per primary team, but plan is home with HHPT to start. ? ?Dressing changed today. New Aquacel placed.  ?Will continue to see daily while inpatient.  ? ? ?Anticipated LOS equal to or greater than 2 midnights due to ?- Age 79 and older with one or more of the following: ? - Obesity ? - Expected need for hospital services (PT, OT, Nursing) required for safe  discharge ? - Anticipated need for postoperative skilled nursing  care or inpatient rehab ? - Active co-morbidities: Heart Failure and Cardiac Arrhythmia ?OR  ? ?- Unanticipated findings during/Post Surgery: None  ?- Patient is a high risk of re-admission due to: 2 or more ED visits within previous 6 months ? ? ?Johnny Oneill ?12/07/2021, 9:16 AM ? ?

## 2021-12-08 DIAGNOSIS — I5022 Chronic systolic (congestive) heart failure: Secondary | ICD-10-CM | POA: Diagnosis not present

## 2021-12-08 DIAGNOSIS — Z95811 Presence of heart assist device: Secondary | ICD-10-CM | POA: Diagnosis not present

## 2021-12-08 DIAGNOSIS — M158 Other polyosteoarthritis: Secondary | ICD-10-CM | POA: Diagnosis not present

## 2021-12-08 LAB — CBC
HCT: 23.3 % — ABNORMAL LOW (ref 39.0–52.0)
HCT: 24.3 % — ABNORMAL LOW (ref 39.0–52.0)
Hemoglobin: 7.5 g/dL — ABNORMAL LOW (ref 13.0–17.0)
Hemoglobin: 8.2 g/dL — ABNORMAL LOW (ref 13.0–17.0)
MCH: 30.2 pg (ref 26.0–34.0)
MCH: 31.4 pg (ref 26.0–34.0)
MCHC: 32.2 g/dL (ref 30.0–36.0)
MCHC: 33.7 g/dL (ref 30.0–36.0)
MCV: 94 fL (ref 80.0–100.0)
MCV: 93.1 fL (ref 80.0–100.0)
Platelets: 158 10*3/uL (ref 150–400)
Platelets: 172 10*3/uL (ref 150–400)
RBC: 2.48 MIL/uL — ABNORMAL LOW (ref 4.22–5.81)
RBC: 2.61 MIL/uL — ABNORMAL LOW (ref 4.22–5.81)
RDW: 14.6 % (ref 11.5–15.5)
RDW: 14.8 % (ref 11.5–15.5)
WBC: 6.5 10*3/uL (ref 4.0–10.5)
WBC: 6.5 10*3/uL (ref 4.0–10.5)
nRBC: 0 % (ref 0.0–0.2)
nRBC: 0 % (ref 0.0–0.2)

## 2021-12-08 LAB — BASIC METABOLIC PANEL
Anion gap: 8 (ref 5–15)
BUN: 41 mg/dL — ABNORMAL HIGH (ref 8–23)
CO2: 23 mmol/L (ref 22–32)
Calcium: 8.6 mg/dL — ABNORMAL LOW (ref 8.9–10.3)
Chloride: 104 mmol/L (ref 98–111)
Creatinine, Ser: 2.3 mg/dL — ABNORMAL HIGH (ref 0.61–1.24)
GFR, Estimated: 28 mL/min — ABNORMAL LOW (ref >60)
Glucose, Bld: 125 mg/dL — ABNORMAL HIGH (ref 70–99)
Potassium: 4.2 mmol/L (ref 3.5–5.1)
Sodium: 135 mmol/L (ref 135–145)

## 2021-12-08 LAB — HEPARIN LEVEL (UNFRACTIONATED)
Heparin Unfractionated: 0.21 IU/mL — ABNORMAL LOW (ref 0.30–0.70)
Heparin Unfractionated: 0.23 IU/mL — ABNORMAL LOW (ref 0.30–0.70)

## 2021-12-08 LAB — PROTIME-INR
INR: 1.4 — ABNORMAL HIGH (ref 0.8–1.2)
Prothrombin Time: 17.4 seconds — ABNORMAL HIGH (ref 11.4–15.2)

## 2021-12-08 LAB — LACTATE DEHYDROGENASE: LDH: 245 U/L — ABNORMAL HIGH (ref 98–192)

## 2021-12-08 LAB — MAGNESIUM: Magnesium: 2 mg/dL (ref 1.7–2.4)

## 2021-12-08 MED ORDER — WARFARIN SODIUM 7.5 MG PO TABS
7.5000 mg | ORAL_TABLET | Freq: Once | ORAL | Status: AC
  Administered 2021-12-08: 7.5 mg via ORAL
  Filled 2021-12-08: qty 1

## 2021-12-08 MED ORDER — MIDODRINE HCL 5 MG PO TABS
5.0000 mg | ORAL_TABLET | Freq: Three times a day (TID) | ORAL | Status: DC
  Administered 2021-12-08 – 2021-12-09 (×2): 5 mg via ORAL
  Filled 2021-12-08 (×2): qty 1

## 2021-12-08 NOTE — Plan of Care (Signed)

## 2021-12-08 NOTE — Progress Notes (Signed)
LVAD Coordinator Rounding Note: ? ?Admitted 12/01/21 to Dr. Haroldine Laws service for heparin bridge for scheduled right total knee arthroplasty. ? 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 Darcey [name] under bridge to transplant criteria. Patient was removed from transplant list 05/2016 per his choice. He is currently Destination Therapy LVAD.  ? ?Patient sitting up in the recliner this morning. Denies complaints.  ? ?Currently taking Midodrine 10 mg tid. Reports he was able to walk in the hallway without orthostatic symptoms this morning. Per PT note yesterday pt is making steady progress with his mobility.  ? ?Right knee with slight redness/warmth noted bilaterally. Pt denies discomfort. Afebrile. WBC 6.5 today.  ? ?He received 1 PRBC yesterday. Hgb 7.5. Will repeat CBC later today and will transfuse as needed per Dr Haroldine Laws.  ? ?Pt due for drive line dressing change today. Offered to change this morning, but pt would like for [name] to change when she comes to visit later today.  ? ?Home health arranged with [facility] per Edwin Cap RN Case Manager's note on 12/05/21.  ? ?Vital signs       ?Temp: 97.8 ?HR: 85 AV paced    ?Doppler Pressure: not documented    ?Automatic BP: 115/92 (100)     ?O2 Sat:  100% on RA ?Wt: 156.7>157.4>158.5>165>168 lbs   ? ?LVAD interrogation reveals:  ?Speed:  9600 ?Flow:  4.7 ?Power:  5.7w ?PI: 6.9 ?Alarms: none ?Events: 3 PI events so far today. 32 noted on 4/8 ? ?Fixed speed:  9600 ?Low speed limit: 9000 ? ?Drive Line:  Drive line is being maintained weekly by [name], his wife. Existing VAD dressing C/D/I Sorbaview dressing and Silverlon patch intact. Exit site healed and incorporated, the velour is fully implanted at exit site. Pt denies fever or chills. Weekly dressing changes per [facility] nurse or caregiver. Next dressing change due today. ? ?Labs:  ?LDH trend: 249>282>172>200>144>245 ? ?INR trend:  1.4>1.3>1.3>1.2>1.3>1.4 ? ?Anticoagulation Plan: ?-INR Goal:  2.5 - 3.0 ?-ASA Dose: 81 mg  daily ? ? ?Device: Medtronic dual ?- Therapies: on 231 bpm ?- Pacing:  AAI<=>DDD 80 bpm ? ?Gtts:  ?- Heparin - 1200 units/hr ? ? ?Plan/Recommendations:  ?Call VAD Coordinator with any VAD equipment or drive line issues. ?Weekly dressing changes per [facility] nurse or caregiver. Wife to change today per pt's request.  ? ?Emerson Monte RN ?VAD Coordinator  ?Office: [phone]  ?24/7 Pager: [phone]  ? ? ? ?

## 2021-12-08 NOTE — Progress Notes (Signed)
ANTICOAGULATION CONSULT NOTE ?Pharmacy Consult for heparin  ?Indication: LVAD ? ?Allergies  ?Allergen Reactions  ? Ace Inhibitors   ?  Hypotension and elevated creatinine  ? Amiodarone Other (See Comments)  ?  Cerebellar side effects  ? Diovan [Valsartan] Other (See Comments)  ?  Hypotension at low dose, hyperkalemia  ? Lipitor [Atorvastatin Calcium] Other (See Comments)  ?  Muscle pain  ? Jardiance [Empagliflozin] Other (See Comments)  ?  Unsure of exact side effect  ? Norvasc [Amlodipine] Other (See Comments)  ?  Put patient to sleep  ? ? ?Patient Measurements: ?Height: 5\' 7"  (170.2 cm) ?Weight: 77.7 kg (171 lb 4.8 oz) ?IBW/kg (Calculated) : 66.1 ? Heparin dosing weight: 71.1 kg ? ?Vital Signs: ?Temp: 97.8 ?F (36.6 ?C) (04/10 9024) ?Temp Source: Oral (04/10 0973) ?BP: 108/93 (04/10 1147) ?Pulse Rate: 114 (04/10 1259) ? ?Labs: ?Recent Labs  ?  12/06/21 ?5329 12/06/21 ?1227 12/07/21 ?0108 12/08/21 ?0105 12/08/21 ?1409  ?HGB 8.1*  --  8.3* 7.5* 8.2*  ?HCT 24.7*  --  24.8* 23.3* 24.3*  ?PLT 125*  --  143* 158 172  ?LABPROT 15.5*  --  15.6* 17.4*  --   ?INR 1.2  --  1.3* 1.4*  --   ?HEPARINUNFRC <0.10*   < > 0.30 0.21* 0.23*  ?CREATININE 2.44*  --  2.27* 2.30*  --   ? < > = values in this interval not displayed.  ? ? ? ?Estimated Creatinine Clearance: 24.7 mL/min (A) (by C-G formula based on SCr of 2.3 mg/dL (H)). ? ? 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 Pharmacy consulted to dose heparin while INR < 2. Underwent R TKR on 4/5 (last dose of warfarin given 3/30). INR 1.4 today and heparin at lower end of goal at 0.21.  ? ?Hemoglobin down from 8.3 to 7.5 and platelets stable. LDH 245.  ?Repeat CBC this afternoon Hgb up to 8.2. Repeat HL 0.23.  ? ?PTA warfarin dose: 5 mg daily ? 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  ?Plan:  ?Continue Heparin at 1200 u/hr ?Give warfarin 7.5 mg tonight as previously  planned ?Daily heparin level, CBC, and INR ? ?Thank you for allowing pharmacy to participate in this patient's care. ? ?Cathrine Muster, PharmD ?PGY2 Cardiology Pharmacy Resident ?Phone: [phone] ?12/08/2021  3:03 PM ? ?Please check [email or web address] for unit-specific pharmacy phone numbers. ? ? ? ? ? ? ? ? ?

## 2021-12-08 NOTE — Progress Notes (Signed)
Physical Therapy Treatment ?Patient Details ?Name: Johnny Oneill ?MRN: [id number] ?DOB: [birth year] ?Today's Date: 12/08/2021 ? ? ?History of Present Illness 81 yo admitted 4/3 with Rt knee OA for optimization prior to TKA performed 4/5. PMhx: CKD, CHF, HTN, LVAD HM II 2015, HLD, CAD, Lt TKA, ICD ? ?  ?PT Comments  ? ? Pt pleasant and able to progress gait distance this session. Pt with HR 80 at rest and brief period of vtach with HEP up to 115 which returned to 85 for remainder of session. Pt performed HEP and back to bed for CPM end of session.  ?   ?Recommendations for follow up therapy are one component of a multi-disciplinary discharge planning process, led by the attending physician.  Recommendations may be updated based on patient status, additional functional criteria and insurance authorization. ? ?Follow Up Recommendations ? Follow physician's recommendations for discharge plan and follow up therapies ?  ?  ?Assistance Recommended at Discharge Frequent or constant Supervision/Assistance  ?Patient can return home with the following A little help with walking and/or transfers;A little help with bathing/dressing/bathroom;Assistance with cooking/housework;Assist for transportation ?  ?Equipment Recommendations ? Rolling walker (2 wheels)  ?  ?Recommendations for Other Services   ? ? ?  ?Precautions / Restrictions Precautions ?Precautions: Knee;Fall;Other (comment) ?Precaution Comments: LVAD, watch HR and BP  ?  ? ?Mobility ? Bed Mobility ?Overal bed mobility: Modified Independent ?Bed Mobility: Sit to Supine ?  ?  ?  ?  ?  ?General bed mobility comments: pt able to assist RLE with LLE onto surface ?  ? ?Transfers ?Overall transfer level: Needs assistance ?  ?Transfers: Sit to/from Stand ?Sit to Stand: Supervision ?  ?  ?  ?  ?  ?General transfer comment: supervision for lines and cues for hand placement ?  ? ?Ambulation/Gait ?Ambulation/Gait assistance: Min guard ?Gait Distance (Feet): 150 Feet ?Assistive device:  Rolling walker (2 wheels) ?Gait Pattern/deviations: Step-through pattern, Decreased stride length ?  ?Gait velocity interpretation: <1.8 ft/sec, indicate of risk for recurrent falls ?  ?General Gait Details: cues for maintained step through pattern, increased distance and looking up ? ? ?Stairs ?  ?  ?  ?  ?  ? ? ?Wheelchair Mobility ?  ? ?Modified Rankin (Stroke Patients Only) ?  ? ? ?  ?Balance Overall balance assessment: Needs assistance ?Sitting-balance support: No upper extremity supported, Feet supported ?Sitting balance-Leahy Scale: Good ?  ?  ?Standing balance support: Bilateral upper extremity supported, During functional activity ?Standing balance-Leahy Scale: Poor ?Standing balance comment: RW for gait ?  ?  ?  ?  ?  ?  ?  ?  ?  ?  ?  ?  ? ?  ?Cognition Arousal/Alertness: Awake/alert ?Behavior During Therapy: [facility] for tasks assessed/performed ?Overall Cognitive Status: Within Functional Limits for tasks assessed ?  ?  ?  ?  ?  ?  ?  ?  ?  ?  ?  ?  ?  ?  ?  ?  ?  ?  ?  ? ?  ?Exercises Total Joint Exercises ?Heel Slides: AAROM, Right, Supine, 15 reps ?Straight Leg Raises: AROM, Right, Supine, 10 reps ?Long Arc Quad: AAROM, Right, Seated, 15 reps ?Knee Flexion: AAROM, Right, Seated, 15 reps ?Goniometric ROM: 0-70 ?Marching in Standing: AROM, Right, 10 reps, Seated ? ?  ?General Comments General comments (skin integrity, edema, etc.): VSS on RA ?  ?  ? ?Pertinent Vitals/Pain Pain Assessment ?Pain Score: 3  ?Pain Location: Rt  knee ?Pain Descriptors / Indicators: Aching, Guarding ?Pain Intervention(s): Limited activity within patient's tolerance, Monitored during session, Premedicated before session, Repositioned  ? ? ?Home Living   ?  ?  ?  ?  ?  ?  ?  ?  ?  ?   ?  ?Prior Function    ?  ?  ?   ? ?PT Goals (current goals can now be found in the care plan section) Progress towards PT goals: Progressing toward goals ? ?  ?Frequency ? ? ? 7X/week ? ? ? ?  ?PT Plan Current plan remains appropriate   ? ? ?Co-evaluation   ?  ?  ?  ?  ? ?  ?AM-PAC PT "6 Clicks" Mobility   ?Outcome Measure ? Help needed turning from your back to your side while in a flat bed without using bedrails?: None ?Help needed moving from lying on your back to sitting on the side of a flat bed without using bedrails?: A Little ?Help needed moving to and from a bed to a chair (including a wheelchair)?: A Little ?Help needed standing up from a chair using your arms (e.g., wheelchair or bedside chair)?: A Little ?Help needed to walk in hospital room?: A Little ?Help needed climbing 3-5 steps with a railing? : A Little ?6 Click Score: 19 ? ?  ?End of Session Equipment Utilized During Treatment: Gait belt ?Activity Tolerance: Patient tolerated treatment well ?Patient left: in bed;with call bell/phone within reach ?Nurse Communication: Mobility status ?PT Visit Diagnosis: Other abnormalities of gait and mobility (R26.89);Difficulty in walking, not elsewhere classified (R26.2);Muscle weakness (generalized) (M62.81) ?  ? ? ?Time: 0272-5366 ?PT Time Calculation (min) (ACUTE ONLY): 32 min ? ?Charges:  $Gait Training: 8-22 mins ?$Therapeutic Exercise: 8-22 mins          ?          ? ?Kerrick Miler P, PT ?Acute Rehabilitation Services ?Pager: [phone] ?Office: [phone] ? ? ? ?[name] B [name] ?12/08/2021, 1:02 PM ? ?

## 2021-12-08 NOTE — Plan of Care (Signed)
?  Problem: Education: ?Goal: Knowledge of General Education information will improve ?Description: Including pain rating scale, medication(s)/side effects and non-pharmacologic comfort measures ?Outcome: Progressing ?  ?Problem: Health Behavior/Discharge Planning: ?Goal: Ability to manage health-related needs will improve ?Outcome: Progressing ?  ?Problem: Clinical Measurements: ?Goal: Ability to maintain clinical measurements within normal limits will improve ?Outcome: Progressing ?Goal: Will remain free from infection ?Outcome: Progressing ?Goal: Diagnostic test results will improve ?Outcome: Progressing ?Goal: Respiratory complications will improve ?Outcome: Progressing ?Goal: Cardiovascular complication will be avoided ?Outcome: Progressing ?  ?Problem: Nutrition: ?Goal: Adequate nutrition will be maintained ?Outcome: Progressing ?  ?Problem: Coping: ?Goal: Level of anxiety will decrease ?Outcome: Progressing ?  ?Problem: Elimination: ?Goal: Will not experience complications related to bowel motility ?Outcome: Progressing ?Goal: Will not experience complications related to urinary retention ?Outcome: Progressing ?  ?Problem: Pain Managment: ?Goal: General experience of comfort will improve ?Outcome: Progressing ?  ?Problem: Safety: ?Goal: Ability to remain free from injury will improve ?Outcome: Progressing ?  ?Problem: Skin Integrity: ?Goal: Risk for impaired skin integrity will decrease ?Outcome: Progressing ?  ?Problem: Education: ?Goal: Knowledge of the prescribed therapeutic regimen will improve ?Outcome: Progressing ?Goal: Individualized Educational Video(s) ?Outcome: Progressing ?  ?Problem: Activity: ?Goal: Ability to avoid complications of mobility impairment will improve ?Outcome: Progressing ?Goal: Range of joint motion will improve ?Outcome: Progressing ?  ?Problem: Clinical Measurements: ?Goal: Postoperative complications will be avoided or minimized ?Outcome: Progressing ?  ?Problem: Pain  Management: ?Goal: Pain level will decrease with appropriate interventions ?Outcome: Progressing ?  ?Problem: Skin Integrity: ?Goal: Will show signs of wound healing ?Outcome: Progressing ?  ?

## 2021-12-08 NOTE — Progress Notes (Signed)
ANTICOAGULATION CONSULT NOTE ?Pharmacy Consult for heparin  ?Indication: LVAD ? ?Allergies  ?Allergen Reactions  ? Ace Inhibitors   ?  Hypotension and elevated creatinine  ? Amiodarone Other (See Comments)  ?  Cerebellar side effects  ? Diovan [Valsartan] Other (See Comments)  ?  Hypotension at low dose, hyperkalemia  ? Lipitor [Atorvastatin Calcium] Other (See Comments)  ?  Muscle pain  ? Jardiance [Empagliflozin] Other (See Comments)  ?  Unsure of exact side effect  ? Norvasc [Amlodipine] Other (See Comments)  ?  Put patient to sleep  ? ? ?Patient Measurements: ?Height: 5\' 7"  (170.2 cm) ?Weight: 77.7 kg (171 lb 4.8 oz) ?IBW/kg (Calculated) : 66.1 ? Heparin dosing weight: 71.1 kg ? ?Vital Signs: ?Temp: 97.8 ?F (36.6 ?C) (04/10 2482) ?Temp Source: Oral (04/10 5003) ?BP: 108/93 (04/10 1147) ?Pulse Rate: 85 (04/10 0734) ? ?Labs: ?Recent Labs  ?  12/06/21 ?7048 12/06/21 ?1227 12/06/21 ?2300 12/07/21 ?0108 12/08/21 ?0105  ?HGB 8.1*  --   --  8.3* 7.5*  ?HCT 24.7*  --   --  24.8* 23.3*  ?PLT 125*  --   --  143* 158  ?LABPROT 15.5*  --   --  15.6* 17.4*  ?INR 1.2  --   --  1.3* 1.4*  ?HEPARINUNFRC <0.10*   < > 0.26* 0.30 0.21*  ?CREATININE 2.44*  --   --  2.27* 2.30*  ? < > = values in this interval not displayed.  ? ? ? ?Estimated Creatinine Clearance: 24.7 mL/min (A) (by C-G formula based on SCr of 2.3 mg/dL (H)). ? ? 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 Pharmacy consulted to dose heparin while INR < 2. Underwent R TKR on 4/5 (last dose of warfarin given 3/30). INR 1.4 today and heparin at goal at 0.21.  ? ?Hemoglobin down from 8.3 to 7.5 and platelets stable. LDH 245.  ?Plan for repeat CBC this afternoon. ? ?PTA warfarin dose: 5 mg daily ? 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  ?Plan:  ?Continue Heparin at 1200 u/hr ?Give warfarin 7.5 mg tonight pending repeat CBC ?Daily heparin level, CBC, and  INR ? ?Thank you for allowing pharmacy to participate in this patient's care. ? ?Cathrine Muster, PharmD ?PGY2 Cardiology Pharmacy Resident ?Phone: [phone] ?12/08/2021  12:49 PM ? ?Please check [email or web address] for unit-specific pharmacy phone numbers. ? ? ? ? ? ? ? ? ?

## 2021-12-08 NOTE — Progress Notes (Addendum)
?Advanced Heart Failure VAD Team Note ? ?PCP-Cardiologist: None  ? ?Subjective:   ? ?- S/p R total knee replacement 04/05 ?- 4/7 transfused 1u RBCs  ? ? ?Worked with PT twice yesterday. Seen this am ambulating halls with PT.  No positional dizziness or tachycardia this am. Not orthostatic. Pain well-controlled. ? ?On heparin gtt + warfarin. HL 0.21 and INR 1.4.  ? ?Hgb 8.3>>7.5. LDH 144> 290>203>245. Scr stable at 2.3.  ? ?MAPs now 90s-100s at rest. Now on midodrine 10 mg TID d/t significant orthostasis. ? ? ? ?LVAD INTERROGATION:  ?HeartMate II LVAD:   ?Flow 4.7 liters/min, speed 9600, power 5.8, PI 7.1. ? ?Objective:   ? ?Vital Signs:   ?Temp:  [97.7 ?F (36.5 ?C)-98.7 ?F (37.1 ?C)] 97.8 ?F (36.6 ?C) (04/10 9485) ?Pulse Rate:  [70-81] 79 (04/10 0635) ?Resp:  [16-20] 20 (04/10 4627) ?BP: (102-117)/(88-92) 105/88 (04/10 0350) ?SpO2:  [96 %-100 %] 100 % (04/09 2319) ?Weight:  [77.7 kg] 77.7 kg (04/10 0635) ?Last BM Date : 12/07/21 ?Mean arterial Pressure  90s-100s at rest ?Intake/Output:  ? ?Intake/Output Summary (Last 24 hours) at 12/08/2021 0719 ?Last data filed at 12/08/2021 [phone] ?Gross per 24 hour  ?Intake 360 ml  ?Output 1800 ml  ?Net -1440 ml  ?  ? ?Physical Exam  ? ?Physical Exam: ?GENERAL: Appears well. Ambulating halls with walker. ?HEENT: normal  ?NECK: Supple, no JVD.  2+ bilaterally, no bruits.   ?CARDIAC:  Mechanical heart sounds with LVAD hum present.  ?LUNGS:  Clear to auscultation bilaterally.  ?ABDOMEN:  Soft, round, nontender, positive bowel sounds x4.     ?LVAD exit site: well-healed and incorporated.  Dressing dry and intact.  No erythema or drainage.  Stabilization device present and accurately applied.  Driveline dressing is being changed daily per sterile technique. ?EXTREMITIES:  Warm and dry, no cyanosis, clubbing, rash, + R Knee swelling with dressing over incision, + TED hose ?NEUROLOGIC:  Alert and oriented x 4.  Gait steady.  No aphasia.  No dysarthria.  Affect pleasant.    ? ? ?Telemetry   ? ?AV paced 80s (personally reviewed) ? ?Labs  ? ?Basic Metabolic Panel: ?Recent Labs  ?Lab 12/04/21 ?0137 12/05/21 ?0053 12/06/21 ?1829 12/07/21 ?0108 12/08/21 ?0105  ?NA 136 135 132* 132* 135  ?K 4.7 4.2 5.1 5.0 4.2  ?CL 108 103 103 102 104  ?CO2 22 26 23 22 23   ?GLUCOSE 150* 136* 116* 116* 125*  ?BUN 29* 34* 39* 39* 41*  ?CREATININE 2.21* 2.65* 2.44* 2.27* 2.30*  ?CALCIUM 8.7* 8.6* 8.5* 8.6* 8.6*  ?MG 1.7 2.6* 2.2 2.1 2.0  ? ? ?Liver Function Tests: ?No results for input(s): AST, ALT, ALKPHOS, BILITOT, PROT, ALBUMIN in the last 168 hours. ?No results for input(s): LIPASE, AMYLASE in the last 168 hours. ?No results for input(s): AMMONIA in the last 168 hours. ? ?CBC: ?Recent Labs  ?Lab 12/04/21 ?0137 12/05/21 ?0053 12/05/21 ?1944 12/06/21 ?9371 12/07/21 ?0108 12/08/21 ?0105  ?WBC 13.7* 11.1*  --  11.0* 10.0 6.5  ?HGB 9.3* 7.7* 8.7* 8.1* 8.3* 7.5*  ?HCT 28.3* 23.9* 25.6* 24.7* 24.8* 23.3*  ?MCV 92.5 94.5  --  93.2 93.6 94.0  ?PLT 127* 131*  --  125* 143* 158  ? ? ?INR: ?Recent Labs  ?Lab 12/04/21 ?0137 12/05/21 ?0053 12/06/21 ?6967 12/07/21 ?0108 12/08/21 ?0105  ?INR 1.2 1.3* 1.2 1.3* 1.4*  ? ? ?Other results: ? ? ? ?Imaging  ? ?No results found. ? ? ?Medications:   ? ? ?Scheduled  Medications: ? aspirin EC  81 mg Oral q AM  ? citalopram  10 mg Oral Daily  ? docusate sodium  100 mg Oral BID  ? levothyroxine  175 mcg Oral Q0600  ? mexiletine  150 mg Oral BID  ? midodrine  10 mg Oral TID WC  ? pantoprazole  40 mg Oral Daily  ? rosuvastatin  20 mg Oral QPM  ? sodium chloride flush  3 mL Intravenous Q12H  ? sotalol  80 mg Oral Daily  ? Warfarin - Pharmacist Dosing Inpatient   Does not apply [id number]  ? ? ?Infusions: ? sodium chloride    ? heparin 1,200 Units/hr (12/07/21 1053)  ? ? ?PRN Medications: ?sodium chloride, acetaminophen, HYDROmorphone (DILAUDID) injection, oxyCODONE **OR** HYDROmorphone, magnesium hydroxide, melatonin, menthol-cetylpyridinium **OR** phenol, metoCLOPramide **OR** metoCLOPramide (REGLAN)  injection, ondansetron (ZOFRAN) IV, polyethylene glycol, senna, sorbitol, zolpidem ? ? ?Patient Profile  ?Johnny Oneill is a 59 year with a h/o CAD, chronic HFrEF with HMII LVAD, VT, A flutter, hypothyroidism, HTN, CKD Stage IV, and OA. 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  ? ?Scheduled admit for TKR ? ? ?Assessment/Plan:   ? 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 Therapy limited by orthostasis. ?- Improved on midodrine + compression hose ? ?2) Orthostatic hypotension ?- suspect vasoplegia ?- volume replete ?- Improved. Continue midodrine. Not orthostatic this am.  ?- compression hose ?  ?3) Chronic systolic HF: ICM, EF 45-80% s/p LVAD HM II implant 08/2013 for DT.   ?- Echo 3/18 EF 10-15% mild AI. Mod RV dysfunction. LVAD cannula OK ?- has struggled with RV failure however RHC 11/22 looked good (despite evidence of RVF on LE dopplers showing reflux ?- Volume status replete (maybe even a bit high) ?- Remains off all diuretics including spiro ?- Failed Jardiance due to volume depletion ?- He has not tolerated ACE-I or amlodipine or b-blocker in the past. ?  ?4) LVAD: HMII 08/2013 for DT. ?-  Admitted in May 2018 with elevated LDH with bival.  ?- Continue aspirin. ?- Off coumadin for TKR. Restarted post-op. ?- INR 1.4. Continue heparin/warfarin  ?- LDH 282>172>200>144>290>200>245 ?- MAPS are orthostatic. Management as above ?  ?5) VT/Frequent PVCs ?- now s/p ICD gen change.  ?- Off amio due to cerebellar side effects.  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 Now on sotalol+ mexiletine.  ?- 06/16/21 recurrent VT. DC-CV x 1 ?- 5 minute run WCT 04/05 ? VT/AFL. Self terminated. ?- Keep K > 4 and Mag > 2 ?  ?6) Paroxysmal Atrial flutter ?- patient paced out of AFL in 6/22. Atrial therapies enabled  ?- continue sotalol (on for VT). QTs slightly prolonged but stable ?- may need to consider ablation if recurs ?- AV paced 80s ?  ?7) Chronic anticoagulation: INR goal  2.0-3.0.    ?- INR 1.4  Plan as above. ?  ?8) Hypothyroidism:  ?- Followed by Dr Dwyane Dee.  ?- Continue levothyroxine.  ?  ?9) Hyperlipidemia:  ?- Continue Crestor. Zetia stopped due to cost.  ?  ?10) CAD ?- No ss/s angina ?- continue Crestor ?  ?11) CKD IV ?- Creatinine baseline 2.3 - 2.7 ?- Creatinine 2.4 >2.3>2.2>2.6>2.4>2.27>2.30  ? ?12) Anemia, acute blood loss ?- Baseline Hgb 10-11 ?- 1 u RBCs on 04/07 ?- Hgb 8.3>7.5. ?- Check CBC this afternoon. Will need unit RBCs if continues to drop. ? ? ?Length of Stay: 7 ? ?FINCH, LINDSAY  N, PA-C ?12/08/2021, 7:19 AM ? ?VAD Team --- VAD ISSUES ONLY--- ?Pager [phone] (7am - 7am) ? ?Advanced Heart Failure Team  ?Pager [phone] (M-F; 7a - 5p)  ?Please contact [facility] for night-coverage after hours (5p -7a ) and weekends on [email or web address] ? ?Patient seen and examined with the above-signed Advanced Practice Provider and/or Housestaff. I personally reviewed laboratory data, imaging studies and relevant notes. I independently examined the patient and formulated the important aspects of the plan. I have edited the note to reflect any of my changes or salient points. I have personally discussed the plan with the patient and/or family. ? ?Doing ok. MAPS now 90-100 on midodrine 10 tid. Volume status stable. SCr stable. On IV heparin and warfarin. INR 1.4. HL 0.21 No bleeding ? ?VAD interrogated personally. Parameters stable. ? ?General:  NAD.  ?HEENT: normal  ?Neck: supple. JVP not elevated.  Carotids 2+ bilat; no bruits. No lymphadenopathy or thryomegaly appreciated. ?Cor: LVAD hum.  ?Lungs: Clear. ?Abdomen: soft, nontender, non-distended. No hepatosplenomegaly. No bruits or masses. Good bowel sounds. Driveline site clean. Anchor in place.  ?Extremities: no cyanosis, clubbing, rash. Warm no edema  R knee mildly swollen ?Neuro: alert & oriented x 3. No focal deficits. Moves all 4 without problem  ? ?Orthostasis much improved. Can likely decrease midodrine to 5 tid and follow.  Continue heparin/warfarin. VAD parameters look good. Continue PT. ? ?Glori Bickers, MD  ?10:58 AM ? ? ? ?

## 2021-12-08 NOTE — Progress Notes (Signed)
Occupational Therapy Treatment ?Patient Details ?Name: Johnny Oneill ?MRN: [id number] ?DOB: [birth year] ?Today's Date: 12/08/2021 ? ? ?History of present illness 81 yo admitted 4/3 with Rt knee OA for optimization prior to TKA performed 4/5. PMhx: CKD, CHF, HTN, LVAD HM II 2015, HLD, CAD, Lt TKA, ICD ?  ?OT comments ? Pt progressing towards established OT goals and motivated to participate in therapy. Providing education for compensatory techniques with LB dressing and shower transfer. Pt donning underwear with Min guard A demonstrating understanding. Pt performing simulated shower transfer with Min guard A and RW. Continue to recommend dc to home once medically stable per physician. Will continue to follow acutely as admitted.   ? ?Recommendations for follow up therapy are one component of a multi-disciplinary discharge planning process, led by the attending physician.  Recommendations may be updated based on patient status, additional functional criteria and insurance authorization. ?   ?Follow Up Recommendations ? No OT follow up  ?  ?Assistance Recommended at Discharge Intermittent Supervision/Assistance  ?Patient can return home with the following ?   ?  ?Equipment Recommendations ? None recommended by OT  ?  ?Recommendations for Other Services   ? ?  ?Precautions / Restrictions Precautions ?Precautions: Knee;Fall;Other (comment) ?Precaution Comments: LVAD, watch HR and BP  ? ? ?  ? ?Mobility Bed Mobility ?Overal bed mobility: Modified Independent ?  ?  ?  ?  ?  ?  ?  ?  ? ?Transfers ?Overall transfer level: Needs assistance ?Equipment used: Rolling walker (2 wheels) ?Transfers: Sit to/from Stand ?Sit to Stand: Min guard ?  ?  ?  ?  ?  ?General transfer comment: Min Guard A for safety ?  ?  ?Balance Overall balance assessment: Needs assistance ?Sitting-balance support: No upper extremity supported, Feet supported ?Sitting balance-Leahy Scale: Good ?  ?  ?Standing balance support: Bilateral upper extremity  supported, During functional activity, No upper extremity supported ?Standing balance-Leahy Scale: Fair ?Standing balance comment: Maintaining static standing while pulling underwear over hips ?  ?  ?  ?  ?  ?  ?  ?  ?  ?  ?  ?   ? ?ADL either performed or assessed with clinical judgement  ? ?ADL Overall ADL's : Needs assistance/impaired ?  ?  ?  ?  ?  ?  ?  ?  ?  ?  ?Lower Body Dressing: Min guard;Sit to/from stand ?Lower Body Dressing Details (indicate cue type and reason): Providing education for LB dresisng. Donning underwear with compensatory technique and increased time. Declines AE for socks and shoes stating his wife will help him as needed. ?Toilet Transfer: Min guard;Ambulation;Rolling walker (2 wheels) (simulated at recliner) ?  ?  ?  ?Tub/ Shower Transfer: Min guard;Walk-in shower;Rolling walker (2 wheels) ?Tub/Shower Transfer Details (indicate cue type and reason): Educating on compensatory tehcniques for transfer ?Functional mobility during ADLs: Min guard;Rolling walker (2 wheels) ?General ADL Comments: Focused session on LB dressing and shower transfer. Pt demonstrated understanding and performed at [place] level ?  ? ?Extremity/Trunk Assessment Upper Extremity Assessment ?Upper Extremity Assessment: Overall WFL for tasks assessed ?  ?Lower Extremity Assessment ?Lower Extremity Assessment: Defer to PT evaluation ?  ?  ?  ? ?Vision   ?  ?  ?Perception   ?  ?Praxis   ?  ? ?Cognition Arousal/Alertness: Awake/alert ?Behavior During Therapy: [facility] for tasks assessed/performed ?Overall Cognitive Status: Within Functional Limits for tasks assessed ?  ?  ?  ?  ?  ?  ?  ?  ?  ?  ?  ?  ?  ?  ?  ?  ?  ?  ?  ?   ?  Exercises   ? ?  ?Shoulder Instructions   ? ? ?  ?General Comments VSS on RA  ? ? ?Pertinent Vitals/ Pain       Pain Assessment ?Pain Assessment: Faces ?Faces Pain Scale: Hurts little more ?Pain Location: Rt knee, bottom ?Pain Descriptors / Indicators: Aching, Guarding ?Pain Intervention(s): Monitored  during session ? ?Home Living   ?  ?  ?  ?  ?  ?  ?  ?  ?  ?  ?  ?  ?  ?  ?  ?  ?  ?  ? ?  ?Prior Functioning/Environment    ?  ?  ?  ?   ? ?Frequency ? Min 2X/week  ? ? ? ? ?  ?Progress Toward Goals ? ?OT Goals(current goals can now be found in the care plan section) ? Progress towards OT goals: Progressing toward goals ? ?Acute Rehab OT Goals ?OT Goal Formulation: With patient ?Time For Goal Achievement: 12/20/21 ?Potential to Achieve Goals: Good ?ADL Goals ?Pt Will Perform Grooming: with set-up;standing ?Pt Will Perform Lower Body Bathing: with set-up;sit to/from stand ?Pt Will Perform Lower Body Dressing: with set-up;sit to/from stand;with adaptive equipment ?Pt Will Transfer to Toilet: with modified independence;ambulating;regular height toilet  ?Plan Discharge plan remains appropriate   ? ?Co-evaluation ? ? ?   ?  ?  ?  ?  ? ?  ?AM-PAC OT "6 Clicks" Daily Activity     ?Outcome Measure ? ? Help from another person eating meals?: None ?Help from another person taking care of personal grooming?: None ?Help from another person toileting, which includes using toliet, bedpan, or urinal?: A Little ?Help from another person bathing (including washing, rinsing, drying)?: A Little ?Help from another person to put on and taking off regular upper body clothing?: None ?Help from another person to put on and taking off regular lower body clothing?: A Little ?6 Click Score: 21 ? ?  ?End of Session Equipment Utilized During Treatment: Rolling walker (2 wheels) ?CPM Right Knee ?CPM Right Knee: Off ? ?OT Visit Diagnosis: Unsteadiness on feet (R26.81);Dizziness and giddiness (R42);Pain ?Pain - Right/Left: Right ?Pain - part of body: Knee ?  ?Activity Tolerance Patient tolerated treatment well ?  ?Patient Left in chair;with call bell/phone within reach ?  ?Nurse Communication Mobility status ?  ? ?   ? ?Time: 3491-7915 ?OT Time Calculation (min): 22 min ? ?Charges: OT General Charges ?$OT Visit: 1 Visit ?OT Treatments ?$Self  Care/Home Management : 8-22 mins ? ?Aubrey Voong MSOT, OTR/L ?Acute Rehab ?Pager: [phone] ?Office: [phone] ? ?Cayleigh Paull M Cheray Pardi ?12/08/2021, 12:16 PM ? ? ?

## 2021-12-08 NOTE — Progress Notes (Signed)
Physical Therapy Treatment ?Patient Details ?Name: Johnny Oneill ?MRN: [id number] ?DOB: [birth year] ?Today's Date: 12/08/2021 ? ? ?History of Present Illness 81 yo admitted 4/3 with Rt knee OA for optimization prior to TKA performed 4/5. PMhx: CKD, CHF, HTN, LVAD HM II 2015, HLD, CAD, Lt TKA, ICD ? ?  ?PT Comments  ? ? [name] reports feeling well and having been on CPM last night however, considerable tightness this am in knee with skin pink. Pt progressing with gait and transfers with HR and BP stable this morning without dizziness. Will continue to follow and encouraged heel foam after breakfast.  ? ?HR 80-88 ?BP supine 105/88 (95) ?Sitting 110/86 (94) ?Standing 115/92 (100) ? ?Speed 9600, flow 4.2>4.7, PI 7.1-7.2, power 5.6-5.8 ?   ?Recommendations for follow up therapy are one component of a multi-disciplinary discharge planning process, led by the attending physician.  Recommendations may be updated based on patient status, additional functional criteria and insurance authorization. ? ?Follow Up Recommendations ? Follow physician's recommendations for discharge plan and follow up therapies ?  ?  ?Assistance Recommended at Discharge Frequent or constant Supervision/Assistance  ?Patient can return home with the following A little help with walking and/or transfers;A little help with bathing/dressing/bathroom;Assistance with cooking/housework;Assist for transportation ?  ?Equipment Recommendations ? Rolling walker (2 wheels)  ?  ?Recommendations for Other Services   ? ? ?  ?Precautions / Restrictions Precautions ?Precautions: Knee;Fall;Other (comment) ?Precaution Comments: LVAD, watch HR and BP  ?  ? ?Mobility ? Bed Mobility ?Overal bed mobility: Modified Independent ?  ?  ?  ?  ?  ?  ?General bed mobility comments: bed flat with increased time and pt able to move RLE without physical assist ?  ? ?Transfers ?Overall transfer level: Needs assistance ?  ?Transfers: Sit to/from Stand ?Sit to Stand: Min guard ?  ?  ?  ?   ?  ?General transfer comment: cues for hand placement without physical assist to rise ?  ? ?Ambulation/Gait ?Ambulation/Gait assistance: Min guard ?Gait Distance (Feet): 88 Feet ?Assistive device: Rolling walker (2 wheels) ?Gait Pattern/deviations: Step-to pattern, Step-through pattern ?  ?Gait velocity interpretation: <1.31 ft/sec, indicative of household ambulator ?  ?General Gait Details: step to pattern with progression to step through with mod cues and assist to advance RW for feel pattern and speed for step through. Improved heel strike. Slow gait with pt able to self regulate distance ? ? ?Stairs ?  ?  ?  ?  ?  ? ? ?Wheelchair Mobility ?  ? ?Modified Rankin (Stroke Patients Only) ?  ? ? ?  ?Balance Overall balance assessment: Needs assistance ?Sitting-balance support: No upper extremity supported, Feet supported ?Sitting balance-Leahy Scale: Good ?  ?  ?Standing balance support: Bilateral upper extremity supported ?Standing balance-Leahy Scale: Poor ?Standing balance comment: bil Ue support on RW for standing ?  ?  ?  ?  ?  ?  ?  ?  ?  ?  ?  ?  ? ?  ?Cognition Arousal/Alertness: Awake/alert ?Behavior During Therapy: [facility] for tasks assessed/performed ?Overall Cognitive Status: Within Functional Limits for tasks assessed ?  ?  ?  ?  ?  ?  ?  ?  ?  ?  ?  ?  ?  ?  ?  ?  ?  ?  ?  ? ?  ?Exercises Total Joint Exercises ?Heel Slides: AAROM, Right, Supine, 15 reps ?Straight Leg Raises: AROM, Right, Supine, 10 reps ?Long Arc Quad: AAROM, Right, Seated, 15  reps ?Knee Flexion: AAROM, Right, Seated, 15 reps ?Goniometric ROM: 0-70 ? ?  ?General Comments   ?  ?  ? ?Pertinent Vitals/Pain Pain Assessment ?Pain Score: 4  ?Pain Location: Rt knee, bottom ?Pain Descriptors / Indicators: Aching, Guarding ?Pain Intervention(s): Limited activity within patient's tolerance, Monitored during session, Premedicated before session, Repositioned  ? ? ?Home Living   ?  ?  ?  ?  ?  ?  ?  ?  ?  ?   ?  ?Prior Function    ?  ?  ?   ? ?PT Goals  (current goals can now be found in the care plan section) Progress towards PT goals: Progressing toward goals ? ?  ?Frequency ? ? ? 7X/week ? ? ? ?  ?PT Plan Current plan remains appropriate  ? ? ?Co-evaluation   ?  ?  ?  ?  ? ?  ?AM-PAC PT "6 Clicks" Mobility   ?Outcome Measure ? Help needed turning from your back to your side while in a flat bed without using bedrails?: A Little ?Help needed moving from lying on your back to sitting on the side of a flat bed without using bedrails?: A Little ?Help needed moving to and from a bed to a chair (including a wheelchair)?: A Little ?Help needed standing up from a chair using your arms (e.g., wheelchair or bedside chair)?: A Little ?Help needed to walk in hospital room?: A Little ?Help needed climbing 3-5 steps with a railing? : A Little ?6 Click Score: 18 ? ?  ?End of Session Equipment Utilized During Treatment: Gait belt ?Activity Tolerance: Patient tolerated treatment well ?Patient left: with call bell/phone within reach;in chair (with donut pillow) ?Nurse Communication: Mobility status ?PT Visit Diagnosis: Other abnormalities of gait and mobility (R26.89);Difficulty in walking, not elsewhere classified (R26.2);Muscle weakness (generalized) (M62.81) ?  ? ? ?Time: 9528-4132 ?PT Time Calculation (min) (ACUTE ONLY): 37 min ? ?Charges:  $Gait Training: 8-22 mins ?$Therapeutic Exercise: 8-22 mins          ?          ? ?Aranza Geddes P, PT ?Acute Rehabilitation Services ?Pager: [phone] ?Office: [phone] ? ? ? ?Annita Ratliff B Ricka Westra ?12/08/2021, 10:07 AM ? ?

## 2021-12-09 DIAGNOSIS — M158 Other polyosteoarthritis: Secondary | ICD-10-CM | POA: Diagnosis not present

## 2021-12-09 DIAGNOSIS — I5022 Chronic systolic (congestive) heart failure: Secondary | ICD-10-CM | POA: Diagnosis not present

## 2021-12-09 DIAGNOSIS — Z95811 Presence of heart assist device: Secondary | ICD-10-CM | POA: Diagnosis not present

## 2021-12-09 LAB — BASIC METABOLIC PANEL
Anion gap: 6 (ref 5–15)
BUN: 40 mg/dL — ABNORMAL HIGH (ref 8–23)
CO2: 26 mmol/L (ref 22–32)
Calcium: 8.8 mg/dL — ABNORMAL LOW (ref 8.9–10.3)
Chloride: 105 mmol/L (ref 98–111)
Creatinine, Ser: 2.19 mg/dL — ABNORMAL HIGH (ref 0.61–1.24)
GFR, Estimated: 30 mL/min — ABNORMAL LOW (ref >60)
Glucose, Bld: 109 mg/dL — ABNORMAL HIGH (ref 70–99)
Potassium: 4.5 mmol/L (ref 3.5–5.1)
Sodium: 137 mmol/L (ref 135–145)

## 2021-12-09 LAB — CBC
HCT: 24.2 % — ABNORMAL LOW (ref 39.0–52.0)
Hemoglobin: 8 g/dL — ABNORMAL LOW (ref 13.0–17.0)
MCH: 30.8 pg (ref 26.0–34.0)
MCHC: 33.1 g/dL (ref 30.0–36.0)
MCV: 93.1 fL (ref 80.0–100.0)
Platelets: 171 10*3/uL (ref 150–400)
RBC: 2.6 MIL/uL — ABNORMAL LOW (ref 4.22–5.81)
RDW: 14.7 % (ref 11.5–15.5)
WBC: 5.6 10*3/uL (ref 4.0–10.5)
nRBC: 0 % (ref 0.0–0.2)

## 2021-12-09 LAB — PROTIME-INR
INR: 1.5 — ABNORMAL HIGH (ref 0.8–1.2)
Prothrombin Time: 18.2 seconds — ABNORMAL HIGH (ref 11.4–15.2)

## 2021-12-09 LAB — MAGNESIUM: Magnesium: 2 mg/dL (ref 1.7–2.4)

## 2021-12-09 LAB — LACTATE DEHYDROGENASE: LDH: 251 U/L — ABNORMAL HIGH (ref 98–192)

## 2021-12-09 LAB — HEPARIN LEVEL (UNFRACTIONATED): Heparin Unfractionated: 0.28 IU/mL — ABNORMAL LOW (ref 0.30–0.70)

## 2021-12-09 MED ORDER — MIDODRINE HCL 5 MG PO TABS
10.0000 mg | ORAL_TABLET | Freq: Three times a day (TID) | ORAL | Status: DC
  Administered 2021-12-09 – 2021-12-12 (×9): 10 mg via ORAL
  Filled 2021-12-09 (×9): qty 2

## 2021-12-09 MED ORDER — MIDODRINE HCL 5 MG PO TABS: 2.5000 mg | ORAL_TABLET | Freq: Three times a day (TID) | ORAL | Status: DC

## 2021-12-09 MED ORDER — WARFARIN SODIUM 7.5 MG PO TABS
7.5000 mg | ORAL_TABLET | Freq: Once | ORAL | Status: AC
  Administered 2021-12-09: 7.5 mg via ORAL
  Filled 2021-12-09: qty 1

## 2021-12-09 NOTE — Progress Notes (Signed)
ANTICOAGULATION CONSULT NOTE ?Pharmacy Consult for heparin  ?Indication: LVAD ? ?Allergies  ?Allergen Reactions  ? Ace Inhibitors   ?  Hypotension and elevated creatinine  ? Amiodarone Other (See Comments)  ?  Cerebellar side effects  ? Diovan [Valsartan] Other (See Comments)  ?  Hypotension at low dose, hyperkalemia  ? Lipitor [Atorvastatin Calcium] Other (See Comments)  ?  Muscle pain  ? Jardiance [Empagliflozin] Other (See Comments)  ?  Unsure of exact side effect  ? Norvasc [Amlodipine] Other (See Comments)  ?  Put patient to sleep  ? ? ?Patient Measurements: ?Height: 5\' 7"  (170.2 cm) ?Weight: 77.7 kg (171 lb 4.8 oz) ?IBW/kg (Calculated) : 66.1 ? Heparin dosing weight: 71.1 kg ? ?Vital Signs: ?Temp: 98.3 ?F (36.8 ?C) (04/11 0719) ?Temp Source: Oral (04/11 0719) ?BP: 110/96 (04/11 0719) ?Pulse Rate: 72 (04/11 0018) ? ?Labs: ?Recent Labs  ?  12/07/21 ?0108 12/08/21 ?0105 12/08/21 ?1409 12/09/21 ?0037  ?HGB 8.3* 7.5* 8.2* 8.0*  ?HCT 24.8* 23.3* 24.3* 24.2*  ?PLT 143* 158 172 171  ?LABPROT 15.6* 17.4*  --  18.2*  ?INR 1.3* 1.4*  --  1.5*  ?HEPARINUNFRC 0.30 0.21* 0.23* 0.28*  ?CREATININE 2.27* 2.30*  --  2.19*  ? ? ? ?Estimated Creatinine Clearance: 26 mL/min (A) (by C-G formula based on SCr of 2.19 mg/dL (H)). ? ? 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 Pharmacy consulted to dose heparin while INR < 2. Underwent R TKR on 4/5 (last dose of warfarin given 3/30).  ? ?Heparin level 0.28, CBC and LDH stable, INR up to 1.5. ? ?PTA warfarin dose: 5 mg daily ? 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  ?Plan:  ?-Continue Heparin at 1200 u/hr ?-Give warfarin 7.5 mg PO x1 again tonight - boosting to facilitate D/C ?-Daily INR, heparin level, CBC, LDH ? ? ?Arrie Senate, PharmD, BCPS, BCCP ?Clinical Pharmacist ?[phone] ?Please check AMION for all [place] numbers ?12/09/2021 ? ? ? ? ? ?

## 2021-12-09 NOTE — Progress Notes (Signed)
Reported by PT that pt. wants his CPM tonight.  ?

## 2021-12-09 NOTE — TOC Initial Note (Signed)
Transition of Care (TOC) - Initial/Assessment Note  ? ? ?Patient Details  ?Name: Johnny Oneill ?MRN: [id number] ?Date of Birth: 08-17-1941 ? ?Transition of Care [facility]) CM/SW Contact:    ?Marcheta Grammes Rexene Alberts, RN ?Phone Number: [phone] ?12/09/2021, 11:41 AM ? ?Clinical Narrative:                 ? ?HF TOC CM spoke to pt at bedside. States he has RW and CPM at home. Contacted [facility] rep, Marjory Lies and they are following for [place] at dc. [place] orders in Epic. Will continue to follow for dc needs.  ? ? ?Expected Discharge Plan: [place] ?Barriers to Discharge: Continued Medical Work up ? ? ?Patient Goals and CMS Choice ?Patient states their goals for this hospitalization and ongoing recovery are:: wants to feel better ?CMS Medicare.gov Compare Post Acute Care list provided to:: Patient ?Choice offered to / list presented to : Patient ? ?Expected Discharge Plan and Services ?Expected Discharge Plan: [place] ?  ?Discharge Planning Services: CM Consult ?Post Acute Care Choice: Home Health ?Living arrangements for the past 2 months: [place] ?                ?DME Arranged: CPM ?DME Agency: [facility] ?  ?  ?  ?HH Arranged: PT ?[place] Agency: [facility] [place] ?Date HH Agency Contacted: 12/09/21 ?Time [place]: [phone] ?Representative spoke with at [place]: Romie Jumper ? ?Prior Living Arrangements/Services ?Living arrangements for the past 2 months: [place] ?Lives with:: Spouse ?Patient language and need for interpreter reviewed:: Yes ?Do you feel safe going back to the place where you live?: Yes      ?Need for Family Participation in Patient Care: Yes (Comment) ?Care giver support system in place?: Yes (comment) ?Current home services: DME (Rolling Gilford Rile, CPM) ?Criminal Activity/Legal Involvement Pertinent to Current Situation/Hospitalization: No - Comment as needed ? ?Activities of Daily Living ?Home Assistive Devices/Equipment: None ?ADL Screening  (condition at time of admission) ?Patient's cognitive ability adequate to safely complete daily activities?: Yes ?Is the patient deaf or have difficulty hearing?: No ?Does the patient have difficulty seeing, even when wearing glasses/contacts?: No ?Does the patient have difficulty concentrating, remembering, or making decisions?: No ?Patient able to express need for assistance with ADLs?: Yes ?Does the patient have difficulty dressing or bathing?: No ?Independently performs ADLs?: Yes (appropriate for developmental age) ?Does the patient have difficulty walking or climbing stairs?: No ?Weakness of Legs: None ?Weakness of Arms/Hands: None ? ?Permission Sought/Granted ?Permission sought to share information with : Case Manager, Family Supports, PCP ?Permission granted to share information with : Yes, Verbal Permission Granted ? Share Information with NAME: Jenson Beedle ? Permission granted to share info w AGENCY: [place] ? Permission granted to share info w Relationship: wife ? Permission granted to share info w Contact Information: [phone] ? ?Emotional Assessment ?Appearance:: Appears stated age ?Attitude/Demeanor/Rapport: Engaged ?Affect (typically observed): Accepting ?Orientation: : Oriented to Self, Oriented to Place, Oriented to  Time, Oriented to Situation ?  ?Psych Involvement: No (comment) ? ?Admission diagnosis:  Osteoarthritis [M19.90] ?Patient Active Problem List  ? Diagnosis Date Noted  ? CKD (chronic kidney disease), stage III ([place]) 06/16/2021  ? Chronic systolic CHF (congestive heart failure) ([place]) 01/02/2015  ? Decreased appetite 11/01/2014  ? Contact dermatitis 09/11/2014  ? Inguinal hernia 08/21/2014  ? Hemothorax 02/08/2014  ? HTN (hypertension) 01/30/2014  ? Chronic anticoagulation 01/08/2014  ? Hypothyroid 12/20/2013  ?  Syncope 11/28/2013  ? Hemothorax on right 11/06/2013  ? Poor venous access 11/02/2013  ? LVAD (left ventricular assist device) present ([place]) 09/18/2013  ? Weakness  generalized 09/15/2013  ? Nonspecific (abnormal) findings on radiological and other examination of gastrointestinal tract 09/14/2013  ? Anemia, unspecified 09/14/2013  ? 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 PVC (premature ventricular contraction) 12/08/2012  ? VT (ventricular tachycardia) 12/08/2012  ? Essential hypertension, benign 11/12/2010  ? ISCHEMIC CARDIOMYOPATHY 11/12/2010  ? Chronic systolic heart failure ([place]) 11/12/2010  ? Ischemic cardiomyopathy   ? Myocardial infarction of lateral wall (HCC)   ? Hypercholesteremia   ? Automatic implantable cardioverter-defibrillator in situ   ? CAD (coronary artery disease)   ? Osteoarthritis   ? ?PCP:  Lavone Orn, MD ?Pharmacy:   ?[facility] ?[place] [address] ?Phone: [phone] Fax: [phone] ? ?[facility] ?[address] ?[place] [address] ?Phone: [phone] Fax: [phone] ? ?[facility] ?[address] ?[place] [address] ?Phone: [phone] Fax: [phone] ? ? ? ? ?Social Determinants of Health (SDOH) Interventions ?  ? ?Readmission Risk Interventions ?   ? View : No data to display.  ?  ?  ?  ? ? ? ?

## 2021-12-09 NOTE — Progress Notes (Addendum)
?Advanced Heart Failure VAD Team Note ? ?PCP-Cardiologist: None  ? ?Subjective:   ? ?- S/p R total knee replacement 04/05 ?- 4/7 transfused 1u RBCs  ?-4/10 Midodrine cut back to 5 mg tid ? ? ?On heparin gtt + warfarin. HL 0.28 and INR 1.5.  ? ?Hgb 8.3>>7.5>>8. LDH 144> 290>203>245>251. Scr stable at 2.2.  ? ?MAPs now 90s ? ?Feels ok . Denies dizziness. Able to walk with PT.  ? ?LVAD INTERROGATION:  ?HeartMate II LVAD:   ?Flow 4 liters/min, speed 9600, power 5, PI 6.5 < 20 PI events.  ? ?Objective:   ? ?Vital Signs:   ?Temp:  [98.3 ?F (36.8 ?C)-98.8 ?F (37.1 ?C)] 98.8 ?F (37.1 ?C) (04/11 0018) ?Pulse Rate:  [72-114] 72 (04/11 0018) ?Resp:  [14-20] 20 (04/11 0018) ?BP: (106-115)/(87-93) 106/87 (04/11 0018) ?SpO2:  [95 %] 95 % (04/11 0018) ?Last BM Date : 12/07/21 ?Mean arterial Pressure 90s ? ?1Intake/Output:  ? ?Intake/Output Summary (Last 24 hours) at 12/09/2021 0651 ?Last data filed at 12/09/2021 0557 ?Gross per 24 hour  ?Intake 340 ml  ?Output 2550 ml  ?Net -2210 ml  ?  ? ?Physical Exam  ? ? ?Physical Exam: ?GENERAL: No acute distress. ?HEENT: normal  ?NECK: Supple, JVP 5-6  .  2+ bilaterally, no bruits.  No lymphadenopathy or thyromegaly appreciated.   ?CARDIAC:  Mechanical heart sounds with LVAD hum present.  ?LUNGS:  Clear to auscultation bilaterally.  ?ABDOMEN:  Soft, round, nontender, positive bowel sounds x4.     ?LVAD exit site:  Dressing dry and intact.  No erythema or drainage.  Stabilization device present and accurately applied.  Driveline dressing is being changed daily per sterile technique. ?EXTREMITIES:  Warm and dry, no cyanosis, clubbing, rash. RLE with some edema. R knee dressing.  ?NEUROLOGIC:  Alert and oriented x 3.    No aphasia.  No dysarthria.  Affect pleasant.    ? ? ?Telemetry  ? ?AV paced 70s ? ?Labs  ? ?Basic Metabolic Panel: ?Recent Labs  ?Lab 12/05/21 ?0053 12/06/21 ?7425 12/07/21 ?0108 12/08/21 ?0105 12/09/21 ?0037  ?NA 135 132* 132* 135 137  ?K 4.2 5.1 5.0 4.2 4.5  ?CL 103 103 102  104 105  ?CO2 26 23 22 23 26   ?GLUCOSE 136* 116* 116* 125* 109*  ?BUN 34* 39* 39* 41* 40*  ?CREATININE 2.65* 2.44* 2.27* 2.30* 2.19*  ?CALCIUM 8.6* 8.5* 8.6* 8.6* 8.8*  ?MG 2.6* 2.2 2.1 2.0 2.0  ? ? ?Liver Function Tests: ?No results for input(s): AST, ALT, ALKPHOS, BILITOT, PROT, ALBUMIN in the last 168 hours. ?No results for input(s): LIPASE, AMYLASE in the last 168 hours. ?No results for input(s): AMMONIA in the last 168 hours. ? ?CBC: ?Recent Labs  ?Lab 12/06/21 ?9563 12/07/21 ?0108 12/08/21 ?0105 12/08/21 ?1409 12/09/21 ?0037  ?WBC 11.0* 10.0 6.5 6.5 5.6  ?HGB 8.1* 8.3* 7.5* 8.2* 8.0*  ?HCT 24.7* 24.8* 23.3* 24.3* 24.2*  ?MCV 93.2 93.6 94.0 93.1 93.1  ?PLT 125* 143* 158 172 171  ? ? ?INR: ?Recent Labs  ?Lab 12/05/21 ?0053 12/06/21 ?8756 12/07/21 ?0108 12/08/21 ?0105 12/09/21 ?0037  ?INR 1.3* 1.2 1.3* 1.4* 1.5*  ? ? ?Other results: ? ? ? ?Imaging  ? ?No results found. ? ? ?Medications:   ? ? ?Scheduled Medications: ? aspirin EC  81 mg Oral q AM  ? citalopram  10 mg Oral Daily  ? docusate sodium  100 mg Oral BID  ? levothyroxine  175 mcg Oral Q0600  ? mexiletine  150  mg Oral BID  ? midodrine  5 mg Oral TID WC  ? pantoprazole  40 mg Oral Daily  ? rosuvastatin  20 mg Oral QPM  ? sodium chloride flush  3 mL Intravenous Q12H  ? sotalol  80 mg Oral Daily  ? Warfarin - Pharmacist Dosing Inpatient   Does not apply [id number]  ? ? ?Infusions: ? sodium chloride    ? heparin 1,200 Units/hr (12/08/21 1019)  ? ? ?PRN Medications: ?sodium chloride, acetaminophen, HYDROmorphone (DILAUDID) injection, oxyCODONE **OR** HYDROmorphone, magnesium hydroxide, melatonin, menthol-cetylpyridinium **OR** phenol, metoCLOPramide **OR** metoCLOPramide (REGLAN) injection, ondansetron (ZOFRAN) IV, polyethylene glycol, senna, sorbitol, zolpidem ? ? ?Patient Profile  ?Johnny Oneill is a 39 year with a h/o CAD, chronic HFrEF with HMII LVAD, VT, A flutter, hypothyroidism, HTN, CKD Stage IV, and OA. 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   ? ?Scheduled admit for TKR ? ? ?Assessment/Plan:   ? 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  ?-Orthostasis resolving.  ?-  compression hose ?- Has walker for d/c . HHPT set up and outpatient PT.  ? ?2) Orthostatic hypotension ?- suspect vasoplegia ?- volume repleted ?- Improved. Continue midodrine at 5 mg tid.  ?- compression hose ?  ?3) Chronic systolic HF: ICM, EF 93-23% s/p LVAD HM II implant 08/2013 for DT.   ?- Echo 3/18 EF 10-15% mild AI. Mod RV dysfunction. LVAD cannula OK ?- has struggled with RV failure however RHC 11/22 looked good (despite evidence of RVF on LE dopplers showing reflux ?- Volume status stable.  ?- Remains off all diuretics including spiro ?- Failed Jardiance due to volume depletion ?- He has not tolerated ACE-I or amlodipine or b-blocker in the past. ?  ?4) LVAD: HMII 08/2013 for DT. ?-  Admitted in May 2018 with elevated LDH with bival.  ?- Continue aspirin. ?- Off coumadin for TKR. Restarted post-op. ?- INR 1.5. Continue heparin/warfarin  ?- LDH stable 251 ?- Management as above ?  ?5) VT/Frequent PVCs ?- now s/p ICD gen change.  ?- Off amio due to cerebellar side effects.  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 Now on sotalol+ mexiletine.  ?- 06/16/21 recurrent VT. DC-CV x 1 ?- 5 minute run WCT 04/05 ? VT/AFL. Self terminated. ?- Keep K > 4 and Mag > 2. Stable.  ?  ?6) Paroxysmal Atrial flutter ?- patient paced out of AFL in 6/22. Atrial therapies enabled  ?- continue sotalol (on for VT). QTs slightly prolonged but stable ?- may need to consider ablation if recurs ?- AV paced 80s ?  ?7) Chronic anticoagulation: INR goal 2.0-3.0.    ?- INR 1.5  Plan as above. ?  ?8) Hypothyroidism:  ?- Followed by Dr Dwyane Dee.  ?- Continue levothyroxine.  ?  ?9) Hyperlipidemia:  ?- Continue Crestor. Zetia stopped due to cost.  ?  ?10) CAD ?- No ss/s angina ?- continue Crestor ?  ?11) CKD IV ?- Creatinine baseline 2.3 - 2.7 ?- Creatinine 2.2.  ?-  Follow BMET daily   ? ?12) Anemia, acute blood loss ?- Baseline Hgb 10-11 ?- 1 u RBCs on 04/07 ?- Hgb 8 today  ? ? ? ? ?Length of Stay: 8 ? ?Darrick Grinder, NP ?12/09/2021, 6:51 AM ? ?VAD Team --- VAD ISSUES ONLY--- ?Pager [phone] (7am - 7am) ? ?Advanced Heart Failure Team  ?Pager [phone] (M-F; 7a - 5p)  ?Please contact [facility] for night-coverage after hours (  5p -7a ) and weekends on [email or web address] ? ?Patient seen and examined with the above-signed Advanced Practice Provider and/or Housestaff. I personally reviewed laboratory data, imaging studies and relevant notes. I independently examined the patient and formulated the important aspects of the plan. I have edited the note to reflect any of my changes or salient points. I have personally discussed the plan with the patient and/or family. ? ? ?Progressing with therapy. Orthostasis much improved. Midodrine dropped to 5 tid. Remains on heparin/warfarin. INR 1.5 No bleeding LDH and renal function stable.  ? ?General:  NAD.  ?HEENT: normal  ?Neck: supple. JVP not elevated.  Carotids 2+ bilat; no bruits. No lymphadenopathy or thryomegaly appreciated. ?Cor: LVAD hum.  ?Lungs: Clear. ?Abdomen: soft, nontender, non-distended. No hepatosplenomegaly. No bruits or masses. Good bowel sounds. Driveline site clean. Anchor in place.  ?Extremities: no cyanosis, clubbing, rash. Warm no edema  R knee post-op  ?Neuro: alert & oriented x 3. No focal deficits. Moves all 4 without problem  ? ?Orthostasis much improved. Drop midodrine to 2.5 tid  Continue heparin/warfarin. Home when INR >= 1.8 ? ?VAD interrogated personally. Parameters stable. ? ?Glori Bickers, MD  ?9:32 AM ? ?

## 2021-12-09 NOTE — Plan of Care (Signed)

## 2021-12-09 NOTE — Progress Notes (Signed)
LVAD Coordinator Rounding Note: ? ?Admitted 12/01/21 to Dr. Haroldine Laws service for heparin bridge for scheduled right total knee arthroplasty. ? 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 Darcey [name] under bridge to transplant criteria. Patient was removed from transplant list 05/2016 per his choice. He is currently Destination Therapy LVAD.  ? ?Patient sitting up on the side of the bed upon my arrival with PT. I walked with them in the hallway. Pt received Midodrine 5 mg at 0555. While walking pt experienced sudden 7/10 throbbing headache over this left eye with subsequent dizziness, HR up into 120s, BP 73/59 (65). Sat pt down in recliner to rest, and wheeled him back to his room. With rest headache eased off to 4/10, HR returned to AV paced 80s, BP improved to 101/87 (93). Discussed the above with Dr Haroldine Laws. Order received to increase Midodrine back to 10 mg TID.  ? ?Pt complaining of tenderness under the balls of his feet. I removed compression hose to examine both feet; no pressure areas noted. Discussed with PT- pt can start wearing tennis shoes with ambulation to help relieve pressure on his feet.  ? ?Updated patient's wife [name] with the above information. Requested she bring tennis shoes for pt.  ? ?Home health PT arranged with [facility] per Edwin Cap RN Case Manager's note on 12/05/21.  ? ?Vital signs       ?Temp: 98.3 ?HR: 86 AV paced    ?Doppler Pressure: 100    ?Automatic BP: 110/96 (102)     ?O2 Sat:  96% on RA ?Wt: 156.7>157.4>158.5>165>168>171.3 lbs   ? ?LVAD interrogation reveals:  ?Speed:  9600 ?Flow: 5.6 ?Power: 6.2w ?PI: 5.3 ?Alarms: none ?Events: 2 PI events so far today ? ?Fixed speed:  9600 ?Low speed limit: 9000 ? ?Drive Line:  Drive line is being maintained weekly by [name], his wife. Existing VAD dressing C/D/I Sorbaview dressing and Silverlon patch intact. Exit site healed and incorporated, the velour is fully implanted at exit site. Pt denies fever or chills. Weekly dressing changes per [facility] nurse or  caregiver. Next dressing change due 12/15/21. ? ?Labs:  ?LDH trend: 249>282>172>200>144>245>251 ? ?INR trend:  1.4>1.3>1.3>1.2>1.3>1.4>1.5 ? ?Anticoagulation Plan: ?-INR Goal:  2.5 - 3.0 ?-ASA Dose: 81 mg daily ? ? ?Device: Medtronic dual ?- Therapies: on 231 bpm ?- Pacing:  AAI<=>DDD 80 bpm ? ?Gtts:  ?- Heparin - 1200 units/hr ? ? ?Plan/Recommendations:  ?Call VAD Coordinator with any VAD equipment or drive line issues. ?Weekly dressing changes per [facility] nurse or caregiver.  ? ?Emerson Monte RN ?VAD Coordinator  ?Office: [phone]  ?24/7 Pager: [phone]  ? ? ? ?

## 2021-12-09 NOTE — Progress Notes (Signed)
PT Cancellation Note ? ?Patient Details ?Name: KRYSTOPHER KUENZEL ?MRN: [id number] ?DOB: May 12, 1941 ? ? ?Cancelled Treatment:    Reason Eval/Treat Not Completed: Other (comment) Pt declining afternoon session due to + dizziness and nausea when going to bathroom earlier. ? ?Wyona Almas, PT, DPT ?Acute Rehabilitation Services ?Pager [phone] ?Office [phone] ? ? ? ?[name] ?12/09/2021, 3:49 PM ?

## 2021-12-09 NOTE — Progress Notes (Addendum)
Physical Therapy Treatment ?Patient Details ?Name: Johnny Oneill ?MRN: [id number] ?DOB: 08-08-1941 ?Today's Date: 12/09/2021 ? ? ?History of Present Illness 81 yo admitted 4/3 with Rt knee OA for optimization prior to TKA performed 4/5. PMhx: CKD, CHF, HTN, LVAD HM II 2015, HLD, CAD, Lt TKA, ICD ? ?  ?PT Comments  ? ? Pt premedicated prior to therapy. Pt ambulating ~50 ft with a walker, utilizing a fairly consistent step through pattern and improved gait speed. While in hallway, pt with sudden 7/10 throbbing headache over left eye, HR up to 128 bpm, BP 73/59 (65). VAD coordinator present to pull chair up to pt and wheeled back to room. With rest, pt headache improved to 4/10 and BP to 101/87 (93) and HR to AV paced 80s. MD notified. Will continue to progress as tolerated. ?  ?Recommendations for follow up therapy are one component of a multi-disciplinary discharge planning process, led by the attending physician.  Recommendations may be updated based on patient status, additional functional criteria and insurance authorization. ? ?Follow Up Recommendations ? Follow physician's recommendations for discharge plan and follow up therapies ?  ?  ?Assistance Recommended at Discharge Frequent or constant Supervision/Assistance  ?Patient can return home with the following A little help with walking and/or transfers;A little help with bathing/dressing/bathroom;Assistance with cooking/housework;Assist for transportation ?  ?Equipment Recommendations ? Rolling walker (2 wheels)  ?  ?Recommendations for Other Services   ? ? ?  ?Precautions / Restrictions Precautions ?Precautions: Knee;Fall;Other (comment) ?Precaution Comments: LVAD, watch HR and BP ?Restrictions ?Weight Bearing Restrictions: No  ?  ? ?Mobility ? Bed Mobility ?Overal bed mobility: Modified Independent ?  ?  ?  ?  ?  ?  ?General bed mobility comments: No physical assist required, increased time ?  ? ?Transfers ?Overall transfer level: Needs assistance ?Equipment  used: Rolling walker (2 wheels) ?Transfers: Sit to/from Stand ?Sit to Stand: Min guard ?  ?  ?  ?  ?  ?General transfer comment: Slow to rise, cues for glute/quad activation ?  ? ?Ambulation/Gait ?Ambulation/Gait assistance: Min guard ?Gait Distance (Feet): 50 Feet ?Assistive device: Rolling walker (2 wheels) ?Gait Pattern/deviations: Step-through pattern, Decreased stride length, Step-to pattern, Decreased stance time - right, Decreased weight shift to right, Antalgic ?Gait velocity: decreased ?Gait velocity interpretation: <1.8 ft/sec, indicate of risk for recurrent falls ?  ?General Gait Details: Cues for R heel strike, step through pattern, progressive weightbearing through RLE, min guard for safety. Pt required seated rest break due to dizziness and sudden onset HA. ? ? ?Stairs ?  ?  ?  ?  ?  ? ? ?Wheelchair Mobility ?  ? ?Modified Rankin (Stroke Patients Only) ?  ? ? ?  ?Balance Overall balance assessment: Needs assistance ?Sitting-balance support: No upper extremity supported, Feet supported ?Sitting balance-Leahy Scale: Good ?  ?  ?Standing balance support: Bilateral upper extremity supported, During functional activity ?Standing balance-Leahy Scale: Fair ?  ?  ?  ?  ?  ?  ?  ?  ?  ?  ?  ?  ?  ? ?  ?Cognition Arousal/Alertness: Awake/alert ?Behavior During Therapy: [facility] for tasks assessed/performed ?Overall Cognitive Status: Within Functional Limits for tasks assessed ?  ?  ?  ?  ?  ?  ?  ?  ?  ?  ?  ?  ?  ?  ?  ?  ?  ?  ?  ? ?  ?Exercises Total Joint Exercises ?Quad Sets: Both, 15 reps, Supine ?  Heel Slides: AAROM, Right, 5 reps, Supine ?Hip ABduction/ADduction: Right, 10 reps, Supine ? ?  ?General Comments   ?  ?  ? ?Pertinent Vitals/Pain Pain Assessment ?Pain Assessment: Faces ?Faces Pain Scale: Hurts even more ?Pain Location: Rt knee ?Pain Descriptors / Indicators: Aching, Guarding ?Pain Intervention(s): Limited activity within patient's tolerance, Monitored during session, Premedicated before session   ? ? ?Home Living   ?  ?  ?  ?  ?  ?  ?  ?  ?  ?   ?  ?Prior Function    ?  ?  ?   ? ?PT Goals (current goals can now be found in the care plan section) Acute Rehab PT Goals ?Patient Stated Goal: return to volleyball ref ?Potential to Achieve Goals: Good ?Progress towards PT goals: Progressing toward goals ? ?  ?Frequency ? ? ? 7X/week ? ? ? ?  ?PT Plan Current plan remains appropriate  ? ? ?Co-evaluation   ?  ?  ?  ?  ? ?  ?AM-PAC PT "6 Clicks" Mobility   ?Outcome Measure ? Help needed turning from your back to your side while in a flat bed without using bedrails?: None ?Help needed moving from lying on your back to sitting on the side of a flat bed without using bedrails?: None ?Help needed moving to and from a bed to a chair (including a wheelchair)?: A Little ?Help needed standing up from a chair using your arms (e.g., wheelchair or bedside chair)?: A Little ?Help needed to walk in hospital room?: A Little ?Help needed climbing 3-5 steps with a railing? : A Little ?6 Click Score: 20 ? ?  ?End of Session Equipment Utilized During Treatment: Gait belt ?Activity Tolerance: Patient tolerated treatment well ?Patient left: in chair;with call bell/phone within reach ?Nurse Communication: Mobility status ?PT Visit Diagnosis: Other abnormalities of gait and mobility (R26.89);Difficulty in walking, not elsewhere classified (R26.2);Muscle weakness (generalized) (M62.81) ?  ? ? ?Time: 1062-6948 ?PT Time Calculation (min) (ACUTE ONLY): 36 min ? ?Charges:  $Gait Training: 8-22 mins ?$Therapeutic Activity: 8-22 mins          ?          ? ?Johnny Oneill, PT, DPT ?Acute Rehabilitation Services ?Pager [phone] ?Office [phone] ? ? ? ?Johnny Oneill ?12/09/2021, 1:26 PM ? ?

## 2021-12-10 LAB — URINALYSIS, ROUTINE W REFLEX MICROSCOPIC
Bilirubin Urine: NEGATIVE
Glucose, UA: NEGATIVE mg/dL
Ketones, ur: NEGATIVE mg/dL
Leukocytes,Ua: NEGATIVE
Nitrite: NEGATIVE
Protein, ur: NEGATIVE mg/dL
Specific Gravity, Urine: 1.014 (ref 1.005–1.030)
pH: 5 (ref 5.0–8.0)

## 2021-12-10 LAB — BASIC METABOLIC PANEL
Anion gap: 7 (ref 5–15)
BUN: 37 mg/dL — ABNORMAL HIGH (ref 8–23)
CO2: 24 mmol/L (ref 22–32)
Calcium: 8.8 mg/dL — ABNORMAL LOW (ref 8.9–10.3)
Chloride: 104 mmol/L (ref 98–111)
Creatinine, Ser: 2.19 mg/dL — ABNORMAL HIGH (ref 0.61–1.24)
GFR, Estimated: 30 mL/min — ABNORMAL LOW (ref >60)
Glucose, Bld: 111 mg/dL — ABNORMAL HIGH (ref 70–99)
Potassium: 4.3 mmol/L (ref 3.5–5.1)
Sodium: 135 mmol/L (ref 135–145)

## 2021-12-10 LAB — HEPARIN LEVEL (UNFRACTIONATED): Heparin Unfractionated: 0.25 IU/mL — ABNORMAL LOW (ref 0.30–0.70)

## 2021-12-10 LAB — CBC
HCT: 25.2 % — ABNORMAL LOW (ref 39.0–52.0)
Hemoglobin: 8.4 g/dL — ABNORMAL LOW (ref 13.0–17.0)
MCH: 31.1 pg (ref 26.0–34.0)
MCHC: 33.3 g/dL (ref 30.0–36.0)
MCV: 93.3 fL (ref 80.0–100.0)
Platelets: 182 10*3/uL (ref 150–400)
RBC: 2.7 MIL/uL — ABNORMAL LOW (ref 4.22–5.81)
RDW: 14.6 % (ref 11.5–15.5)
WBC: 6.6 10*3/uL (ref 4.0–10.5)
nRBC: 0 % (ref 0.0–0.2)

## 2021-12-10 LAB — LACTATE DEHYDROGENASE: LDH: 265 U/L — ABNORMAL HIGH (ref 98–192)

## 2021-12-10 LAB — MAGNESIUM: Magnesium: 2 mg/dL (ref 1.7–2.4)

## 2021-12-10 LAB — PROTIME-INR
INR: 1.7 — ABNORMAL HIGH (ref 0.8–1.2)
Prothrombin Time: 20 seconds — ABNORMAL HIGH (ref 11.4–15.2)

## 2021-12-10 MED ORDER — TRAMADOL HCL 50 MG PO TABS: 100.0000 mg | ORAL_TABLET | Freq: Four times a day (QID) | ORAL | Status: DC | PRN

## 2021-12-10 MED ORDER — WARFARIN SODIUM 5 MG PO TABS
5.0000 mg | ORAL_TABLET | Freq: Once | ORAL | Status: AC
  Administered 2021-12-10: 5 mg via ORAL
  Filled 2021-12-10: qty 1

## 2021-12-10 MED ORDER — OXYCODONE HCL 5 MG PO TABS
5.0000 mg | ORAL_TABLET | Freq: Two times a day (BID) | ORAL | Status: DC | PRN
  Administered 2021-12-10 – 2021-12-12 (×3): 5 mg via ORAL
  Filled 2021-12-10 (×3): qty 1

## 2021-12-10 MED ORDER — SODIUM CHLORIDE 0.9 % IV BOLUS
250.0000 mL | Freq: Once | INTRAVENOUS | Status: AC
  Administered 2021-12-10: 250 mL via INTRAVENOUS

## 2021-12-10 NOTE — Progress Notes (Signed)
ANTICOAGULATION CONSULT NOTE ?Pharmacy Consult for heparin  ?Indication: LVAD ? ?Allergies  ?Allergen Reactions  ? Ace Inhibitors   ?  Hypotension and elevated creatinine  ? Amiodarone Other (See Comments)  ?  Cerebellar side effects  ? Diovan [Valsartan] Other (See Comments)  ?  Hypotension at low dose, hyperkalemia  ? Lipitor [Atorvastatin Calcium] Other (See Comments)  ?  Muscle pain  ? Jardiance [Empagliflozin] Other (See Comments)  ?  Unsure of exact side effect  ? Norvasc [Amlodipine] Other (See Comments)  ?  Put patient to sleep  ? ? ?Patient Measurements: ?Height: 5\' 7"  (170.2 cm) ?Weight: 73.6 kg (162 lb 4.1 oz) ?IBW/kg (Calculated) : 66.1 ? Heparin dosing weight: 71.1 kg ? ?Vital Signs: ?Temp: 98.1 ?F (36.7 ?C) (04/12 4825) ?Temp Source: Oral (04/12 0037) ?BP: 115/90 (04/12 0488) ?Pulse Rate: 75 (04/12 0738) ? ?Labs: ?Recent Labs  ?  12/08/21 ?0105 12/08/21 ?1409 12/09/21 ?8916 12/10/21 ?0107  ?HGB 7.5* 8.2* 8.0* 8.4*  ?HCT 23.3* 24.3* 24.2* 25.2*  ?PLT 158 172 171 182  ?LABPROT 17.4*  --  18.2* 20.0*  ?INR 1.4*  --  1.5* 1.7*  ?HEPARINUNFRC 0.21* 0.23* 0.28* 0.25*  ?CREATININE 2.30*  --  2.19* 2.19*  ? ? ? ?Estimated Creatinine Clearance: 26 mL/min (A) (by C-G formula based on SCr of 2.19 mg/dL (H)). ? ? 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 Pharmacy consulted to dose heparin while INR < 2. Underwent R TKR on 4/5 (last dose of warfarin given 3/30).  ? ?Heparin level 0.25, CBC and LDH stable, INR up to 1.7 - trending up but slowly. ? ?PTA warfarin dose: 5 mg daily ? 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  ?Plan:  ?-Continue Heparin at 1200 u/hr ?-Give warfarin 5 mg PO x1 tonight ?-Daily INR, heparin level, CBC, LDH ? ? ?Arrie Senate, PharmD, BCPS, BCCP ?Clinical Pharmacist ?[phone] ?Please check AMION for all [place] numbers ?12/10/2021 ? ? ? ? ? ?

## 2021-12-10 NOTE — Care Plan (Signed)
Ortho CM note:   ?Continuing to follow for ortho needs. Will keep plan with HHPT when medically ready for discharge. Will push OPPT appointment out for now  ? ?Ladell Heads, [place]  ?[phone] ?

## 2021-12-10 NOTE — Progress Notes (Signed)
Physical Therapy Treatment ?Patient Details ?Name: Johnny Oneill ?MRN: [id number] ?DOB: 05/18/1941 ?Today's Date: 12/10/2021 ? ? ?History of Present Illness 81 yo admitted 4/3 with Rt knee OA for optimization prior to TKA performed 4/5. PMhx: CKD, CHF, HTN, LVAD HM II 2015, HLD, CAD, Lt TKA, ICD ? ?  ?PT Comments  ? ? Pt not progressing towards his physical therapy goals; remains limited by hypotension and + dizziness. Pt able to participate in supine/sitting exercises for R knee ROM and strengthening. Ambulating 40 ft with a walker and step to pattern before needing to sit due to lightheadedness (TED hose donned prior). BP 76/58 (65). BP reassessed x 2 minutes 106/84 (92). Once standing again, pt with immediate onset of dizziness needing to sit back down. BP 79/69 (74). HR 80's. Notified PA, Amy. Will continue to progress as tolerated. ?  ?Recommendations for follow up therapy are one component of a multi-disciplinary discharge planning process, led by the attending physician.  Recommendations may be updated based on patient status, additional functional criteria and insurance authorization. ? ?Follow Up Recommendations ? Follow physician's recommendations for discharge plan and follow up therapies ?  ?  ?Assistance Recommended at Discharge Frequent or constant Supervision/Assistance  ?Patient can return home with the following A little help with walking and/or transfers;A little help with bathing/dressing/bathroom;Assistance with cooking/housework;Assist for transportation ?  ?Equipment Recommendations ? Rolling walker (2 wheels)  ?  ?Recommendations for Other Services   ? ? ?  ?Precautions / Restrictions Precautions ?Precautions: Knee;Fall;Other (comment) ?Precaution Comments: LVAD, watch HR and BP ?Restrictions ?Weight Bearing Restrictions: No  ?  ? ?Mobility ? Bed Mobility ?Overal bed mobility: Modified Independent ?  ?  ?  ?  ?  ?  ?General bed mobility comments: No physical assist required, increased time ?   ? ?Transfers ?Overall transfer level: Needs assistance ?Equipment used: Rolling walker (2 wheels) ?Transfers: Sit to/from Stand ?Sit to Stand: Min guard, Min assist ?  ?  ?  ?  ?  ?General transfer comment: Slow to rise, min guard from edge of bed, light minA from recliner ?  ? ?Ambulation/Gait ?Ambulation/Gait assistance: Min guard ?Gait Distance (Feet): 40 Feet ?Assistive device: Rolling walker (2 wheels) ?Gait Pattern/deviations: Decreased stride length, Step-to pattern, Decreased stance time - right, Decreased weight shift to right, Antalgic ?Gait velocity: decreased ?Gait velocity interpretation: <1.8 ft/sec, indicate of risk for recurrent falls ?  ?General Gait Details: Step to pattern, pt self cueing for R heel strike, unable to progress to step through pattern at this time. Pt with + dizziness, required sitting rest break ? ? ?Stairs ?  ?  ?  ?  ?  ? ? ?Wheelchair Mobility ?  ? ?Modified Rankin (Stroke Patients Only) ?  ? ? ?  ?Balance Overall balance assessment: Needs assistance ?Sitting-balance support: No upper extremity supported, Feet supported ?Sitting balance-Leahy Scale: Good ?  ?  ?Standing balance support: Bilateral upper extremity supported, During functional activity ?Standing balance-Leahy Scale: Fair ?  ?  ?  ?  ?  ?  ?  ?  ?  ?  ?  ?  ?  ? ?  ?Cognition Arousal/Alertness: Awake/alert ?Behavior During Therapy: [facility] for tasks assessed/performed ?Overall Cognitive Status: Within Functional Limits for tasks assessed ?  ?  ?  ?  ?  ?  ?  ?  ?  ?  ?  ?  ?  ?  ?  ?  ?  ?  ?  ? ?  ?  Exercises Total Joint Exercises ?Ankle Circles/Pumps: Both, 15 reps, Supine ?Quad Sets: Both, 15 reps, Supine ?Heel Slides: AAROM, Right, Supine, 15 reps, Seated ?Hip ABduction/ADduction: Right, 10 reps, Supine ?Long Arc Quad: Right, 10 reps, Seated ? ?  ?General Comments   ?  ?  ? ?Pertinent Vitals/Pain Pain Assessment ?Pain Assessment: Faces ?Faces Pain Scale: Hurts even more ?Pain Location: Rt knee ?Pain Descriptors /  Indicators: Aching, Guarding ?Pain Intervention(s): Monitored during session, Limited activity within patient's tolerance, Premedicated before session  ? ? ?Home Living   ?  ?  ?  ?  ?  ?  ?  ?  ?  ?   ?  ?Prior Function    ?  ?  ?   ? ?PT Goals (current goals can now be found in the care plan section) Acute Rehab PT Goals ?Patient Stated Goal: return to volleyball ref ?Potential to Achieve Goals: Good ?Progress towards PT goals: Progressing toward goals ? ?  ?Frequency ? ? ? 7X/week ? ? ? ?  ?PT Plan Current plan remains appropriate  ? ? ?Co-evaluation   ?  ?  ?  ?  ? ?  ?AM-PAC PT "6 Clicks" Mobility   ?Outcome Measure ? Help needed turning from your back to your side while in a flat bed without using bedrails?: None ?Help needed moving from lying on your back to sitting on the side of a flat bed without using bedrails?: None ?Help needed moving to and from a bed to a chair (including a wheelchair)?: A Little ?Help needed standing up from a chair using your arms (e.g., wheelchair or bedside chair)?: A Little ?Help needed to walk in hospital room?: A Little ?Help needed climbing 3-5 steps with a railing? : A Little ?6 Click Score: 20 ? ?  ?End of Session Equipment Utilized During Treatment: Gait belt ?Activity Tolerance: Patient tolerated treatment well ?Patient left: in chair;with call bell/phone within reach ?Nurse Communication: Mobility status ?PT Visit Diagnosis: Other abnormalities of gait and mobility (R26.89);Difficulty in walking, not elsewhere classified (R26.2);Muscle weakness (generalized) (M62.81) ?  ? ? ?Time: 1751-0258 ?PT Time Calculation (min) (ACUTE ONLY): 37 min ? ?Charges:  $Therapeutic Exercise: 8-22 mins ?$Therapeutic Activity: 8-22 mins          ?          ? ?Wyona Almas, PT, DPT ?Acute Rehabilitation Services ?Pager [phone] ?Office [phone] ? ? ? ?Johnny Oneill ?12/10/2021, 2:13 PM ? ?

## 2021-12-10 NOTE — Progress Notes (Signed)
? ?  Received message from PT  ? ?He is complaining of  dysuria and dizziness when standing.  ? ? ?Give 250 cc bolus  ?Check UA and urine culture.  ? ? ?Jareth Pardee NP-C  ?12:59 PM ? ? ? ?

## 2021-12-10 NOTE — Progress Notes (Signed)
Patient wishes to not use CPM tonight. States he will tomorrow.  ?

## 2021-12-10 NOTE — Progress Notes (Signed)
LVAD Coordinator Rounding Note: ? ?Admitted 12/01/21 to Dr. Haroldine Laws service for heparin bridge for scheduled right total knee arthroplasty. ? 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 Darcey [name] under bridge to transplant criteria. Patient was removed from transplant list 05/2016 per his choice. He is currently Destination Therapy LVAD.  ? ?Patient laying in bed this morning on my arrival. States he did not sleep well last night. Denies headache and dizziness currently. Receiving Midodrine 10 mg TID. Has not been out of bed yet this morning with PT.   ? ?Home health PT arranged with [facility] per Edwin Cap RN Case Manager's note on 12/05/21.  ? ?Vital signs       ?Temp: 98.1 ?HR: 80 AV paced    ?Doppler Pressure: 100    ?Automatic BP: 115/90 (99)     ?O2 Sat: 100% on RA ?Wt: 156.7>157.4>158.5>165>168>171.3>162.2 lbs   ? ?LVAD interrogation reveals:  ?Speed:  9600 ?Flow: 4.3 ?Power: 5.6 w ?PI: 6.2 ?Alarms: none ?Events: 1 PI event so far today; 15 yesterday ? ?Fixed speed:  9600 ?Low speed limit: 9000 ? ?Drive Line:  Drive line is being maintained weekly by [name], his wife. Existing VAD dressing C/D/I Sorbaview dressing and Silverlon patch intact. Exit site healed and incorporated, the velour is fully implanted at exit site. Pt denies fever or chills. Weekly dressing changes per [facility] nurse or caregiver. Next dressing change due 12/15/21. ? ?Labs:  ?LDH trend: 249>282>172>200>144>245>251>265 ? ?INR trend:  1.4>1.3>1.3>1.2>1.3>1.4>1.5>1.7 ? ?Anticoagulation Plan: ?-INR Goal:  2.5 - 3.0 ?-ASA Dose: 81 mg daily ? ? ?Device: Medtronic dual ?- Therapies: on 231 bpm ?- Pacing:  AAI<=>DDD 80 bpm ? ?Gtts:  ?- Heparin - 1200 units/hr ? ? ?Plan/Recommendations:  ?Call VAD Coordinator with any VAD equipment or drive line issues. ?Weekly dressing changes per [facility] nurse or caregiver.  ? ?Emerson Monte RN ?VAD Coordinator  ?Office: [phone]  ?24/7 Pager: [phone]  ? ? ? ? ? ? ? ? ? ?

## 2021-12-10 NOTE — Progress Notes (Addendum)
?Advanced Heart Failure VAD Team Note ? ?PCP-Cardiologist: None  ? ?Subjective:   ? ?- S/p R total knee replacement 04/05 ?- 4/7 transfused 1u RBCs  ?-4/10 Midodrine cut back to 5 mg tid ?- 4/11 Failed midodrine taper. Increased back to 10 mg tid.  ? ?On heparin gtt + warfarin. HL 0.25 and INR 1.7 ? ?Hgb 8.4 ? LDH 144> 290>203>245>251>265 ? ?Scr stable at 2.2.  ? ?MAPs now 90s ? ? ?Denies dizziness. Denies SOB.  ?  ? ?LVAD INTERROGATION:  ?HeartMate II LVAD:   ?Flow 4.2 liters/min, speed 9600, power 6, PI 6.4.  ? ?Objective:   ? ?Vital Signs:   ?Temp:  [98.3 ?F (36.8 ?C)-98.7 ?F (37.1 ?C)] 98.7 ?F (37.1 ?C) (04/12 0600) ?Pulse Rate:  [78-80] 80 (04/12 0600) ?Resp:  [12-19] 15 (04/12 0600) ?BP: (99-108)/(81-95) 105/95 (04/12 0600) ?SpO2:  [92 %-98 %] 97 % (04/12 0600) ?Weight:  [73.6 kg] 73.6 kg (04/12 0600) ?Last BM Date : 12/09/21 ?Mean arterial Pressure 90s ? ?1Intake/Output:  ? ?Intake/Output Summary (Last 24 hours) at 12/10/2021 0723 ?Last data filed at 12/10/2021 0300 ?Gross per 24 hour  ?Intake 1136.1 ml  ?Output 951 ml  ?Net 185.1 ml  ?  ? ?Physical Exam  ? ? ?Physical Exam: ?GENERAL: No acute distress. ?HEENT: normal  ?NECK: Supple, JVP 5-6 .  2+ bilaterally, no bruits.  No lymphadenopathy or thyromegaly appreciated.   ?CARDIAC:  Mechanical heart sounds with LVAD hum present.  ?LUNGS:  Clear to auscultation bilaterally.  ?ABDOMEN:  Soft, round, nontender, positive bowel sounds x4.     ?LVAD exit site: well-healed and incorporated.  Dressing dry and intact.  No erythema or drainage.  Stabilization device present and accurately applied.  Driveline dressing is being changed daily per sterile technique. ?EXTREMITIES:  Warm and dry, no cyanosis, clubbing, rash. R knee dressing. RLE with trace amount of edema.  ?NEUROLOGIC:  Alert and oriented x 3.    No aphasia.  No dysarthria.  Affect pleasant.    ? ? ? ?Telemetry  ? ?AV paced 70s  ? ?Labs  ? ?Basic Metabolic Panel: ?Recent Labs  ?Lab 12/06/21 ?7425  12/07/21 ?0108 12/08/21 ?0105 12/09/21 ?9563 12/10/21 ?0107  ?NA 132* 132* 135 137 135  ?K 5.1 5.0 4.2 4.5 4.3  ?CL 103 102 104 105 104  ?CO2 23 22 23 26 24   ?GLUCOSE 116* 116* 125* 109* 111*  ?BUN 39* 39* 41* 40* 37*  ?CREATININE 2.44* 2.27* 2.30* 2.19* 2.19*  ?CALCIUM 8.5* 8.6* 8.6* 8.8* 8.8*  ?MG 2.2 2.1 2.0 2.0 2.0  ? ? ?Liver Function Tests: ?No results for input(s): AST, ALT, ALKPHOS, BILITOT, PROT, ALBUMIN in the last 168 hours. ?No results for input(s): LIPASE, AMYLASE in the last 168 hours. ?No results for input(s): AMMONIA in the last 168 hours. ? ?CBC: ?Recent Labs  ?Lab 12/07/21 ?0108 12/08/21 ?0105 12/08/21 ?1409 12/09/21 ?8756 12/10/21 ?0107  ?WBC 10.0 6.5 6.5 5.6 6.6  ?HGB 8.3* 7.5* 8.2* 8.0* 8.4*  ?HCT 24.8* 23.3* 24.3* 24.2* 25.2*  ?MCV 93.6 94.0 93.1 93.1 93.3  ?PLT 143* 158 172 171 182  ? ? ?INR: ?Recent Labs  ?Lab 12/06/21 ?4332 12/07/21 ?0108 12/08/21 ?0105 12/09/21 ?9518 12/10/21 ?0107  ?INR 1.2 1.3* 1.4* 1.5* 1.7*  ? ? ?Other results: ? ? ? ?Imaging  ? ?No results found. ? ? ?Medications:   ? ? ?Scheduled Medications: ? aspirin EC  81 mg Oral q AM  ? citalopram  10 mg Oral Daily  ?  docusate sodium  100 mg Oral BID  ? levothyroxine  175 mcg Oral Q0600  ? mexiletine  150 mg Oral BID  ? midodrine  10 mg Oral TID WC  ? pantoprazole  40 mg Oral Daily  ? rosuvastatin  20 mg Oral QPM  ? sodium chloride flush  3 mL Intravenous Q12H  ? sotalol  80 mg Oral Daily  ? Warfarin - Pharmacist Dosing Inpatient   Does not apply [id number]  ? ? ?Infusions: ? sodium chloride    ? heparin 1,200 Units/hr (12/10/21 0300)  ? ? ?PRN Medications: ?sodium chloride, acetaminophen, HYDROmorphone (DILAUDID) injection, oxyCODONE **OR** HYDROmorphone, magnesium hydroxide, melatonin, menthol-cetylpyridinium **OR** phenol, metoCLOPramide **OR** metoCLOPramide (REGLAN) injection, ondansetron (ZOFRAN) IV, polyethylene glycol, senna, sorbitol, zolpidem ? ? ?Patient Profile  ?Mr Corpus is a 63 year with a h/o CAD, chronic HFrEF with  HMII LVAD, VT, A flutter, hypothyroidism, HTN, CKD Stage IV, and OA. 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  ? ?Scheduled admit for TKR ? ? ?Assessment/Plan:   ? 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  ?-Orthostasis resolving.  ?-  compression hose ?- Has walker for d/c . HHPT set up and outpatient PT.  ? ?2) Orthostatic hypotension ?- suspect vasoplegia ?- volume repleted ?- Failed midodrine wean. Continue midodrine 10 mg tid.   ?- compression hose ?  ?3) Chronic systolic HF: ICM, EF 73-41% s/p LVAD HM II implant 08/2013 for DT.   ?- Echo 3/18 EF 10-15% mild AI. Mod RV dysfunction. LVAD cannula OK ?- has struggled with RV failure however RHC 11/22 looked good (despite evidence of RVF on LE dopplers showing reflux ?- Volume status stable.   ?- Remains off all diuretics including spiro ?- Failed Jardiance due to volume depletion ?- He has not tolerated ACE-I or amlodipine or b-blocker in the past. ?  ?4) LVAD: HMII 08/2013 for DT. ?-  Admitted in May 2018 with elevated LDH with bival.  ?- Continue aspirin. ?- Off coumadin for TKR. Restarted post-op. ?- INR 1 7. Continue heparin/warfarin  ?- LDH stable 251>265  ?- Management as above ?  ?5) VT/Frequent PVCs ?- now s/p ICD gen change.  ?- Off amio due to cerebellar side effects.  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 Now on sotalol+ mexiletine.  ?- 06/16/21 recurrent VT. DC-CV x 1 ?- 5 minute run WCT 04/05 ? VT/AFL. Self terminated. ?- Keep K > 4 and Mag > 2. Stable.  ?  ?6) Paroxysmal Atrial flutter ?- patient paced out of AFL in 6/22. Atrial therapies enabled  ?- continue sotalol (on for VT). QTs slightly prolonged but stable ?- may need to consider ablation if recurs ?- AV paced 80s ?  ?7) Chronic anticoagulation: INR goal 2.0-3.0.    ?- INR 1.7 Plan as above. ?  ?8) Hypothyroidism:  ?- Followed by Dr Dwyane Dee.  ?- Continue levothyroxine.  ?  ?9) Hyperlipidemia:  ?- Continue Crestor. Zetia  stopped due to cost.  ?  ?10) CAD ?- No s/s angina ?- continue Crestor ?  ?11) CKD IV ?- Creatinine baseline 2.3 - 2.7 ?- Creatinine 2.2   ?- Follow BMET daily   ? ?12) Anemia, acute blood loss ?- Baseline Hgb 10-11 ?- 1 u RBCs on 04/07 ?- Hgb 8.4 today  ? ?Continue midodrine 10 mg tid do not taper for now.  ? ?Continue PT.  ? ? ? ?Length of Stay: 9 ? ?  Darrick Grinder, NP ?12/10/2021, 7:23 AM ? ?VAD Team --- VAD ISSUES ONLY--- ?Pager [phone] (7am - 7am) ? ?Advanced Heart Failure Team  ?Pager [phone] (M-F; 7a - 5p)  ?Please contact [facility] for night-coverage after hours (5p -7a ) and weekends on [email or web address] ? ?Patient seen and examined with the above-signed Advanced Practice Provider and/or Housestaff. I personally reviewed laboratory data, imaging studies and relevant notes. I independently examined the patient and formulated the important aspects of the plan. I have edited the note to reflect any of my changes or salient points. I have personally discussed the plan with the patient and/or family. ? ?Remains very orthostatic. Denies fevers or chills. VAD parameters stable. On heparin/warfarin. INR 1.7 HL 0.25 LDH up slightly ? ?General:  NAD.  ?HEENT: normal  ?Neck: supple. JVP not elevated.  Carotids 2+ bilat; no bruits. No lymphadenopathy or thryomegaly appreciated. ?Cor: LVAD hum.  ?Lungs: Clear. ?Abdomen: obese soft, nontender, non-distended. No hepatosplenomegaly. No bruits or masses. Good bowel sounds. Driveline site clean. Anchor in place.  ?Extremities: no cyanosis, clubbing, rash. Warm no edema  ?Neuro: alert & oriented x 3. No focal deficits. Moves all 4 without problem  ? ?Has severe orthostasis. Unclear etiology. ? Due to narcotics. Will switch to tramadol. Continue ted hose. Increase midodrine to 10 tid. Volume status ok.  ? ?VAD interrogated personally. Parameters stable. ? ?Glori Bickers, MD  ?4:04 PM ? ? ?

## 2021-12-10 NOTE — Progress Notes (Signed)
This afternoon patient walked about "~40 ft, + dizziness, BP 76/58 (65). After sitting 2 minutes, BP 106/84 (92). Tried again to stand, immediate onset of dizziness, BP 79/69 (74)" while working with PT. Pt also complaining of burning with urination. Pt has been using condom cath due to occasional urgency when waking up. ? ?Discussed with Darrick Grinder NP. Order received for NS 250 mL bolus, and urine culture. Discussed pain medication regimen with Amy and Dr Haroldine Laws. Pt currently taking Oxycodone 3-4 times per day. Plan to transition pt to PRN Tramadol, with PRN Oxycodone only for breakthrough pain BID PRN.   ? ?After pt received IVF bolus BP 112/90 (97). Denies dizziness and nausea while sitting. Assisted pt to a standing position- BP 107/89 (86) while standing with minimal dizziness. VAD numbers stable. Amy NP update with the above.  ? ?Emerson Monte RN ?VAD Coordinator  ?Office: [phone]  ?24/7 Pager: [phone]  ? ?

## 2021-12-10 NOTE — Progress Notes (Signed)
Physical Therapy Treatment ?Patient Details ?Name: Johnny Oneill ?MRN: [id number] ?DOB: November 14, 1940 ?Today's Date: 12/10/2021 ? ? ?History of Present Illness 81 yo admitted 4/3 with Rt knee OA for optimization prior to TKA performed 4/5. PMhx: CKD, CHF, HTN, LVAD HM II 2015, HLD, CAD, Lt TKA, ICD ? ?  ?PT Comments  ? ? Pt demonstrating improved activity tolerance and ambulation distance in PM session. Pt spouse brought in tennis shoes for pt to wear. Pt ambulating 200 ft with a walker, progressing to step through pattern. Requiring two brief standing rest breaks. Denies dizziness/lightheadedness. BP 120/84 (96), HR AV paced 80's. Returned to bed on CPM machine.  ?   ?Recommendations for follow up therapy are one component of a multi-disciplinary discharge planning process, led by the attending physician.  Recommendations may be updated based on patient status, additional functional criteria and insurance authorization. ? ?Follow Up Recommendations ? Follow physician's recommendations for discharge plan and follow up therapies ?  ?  ?Assistance Recommended at Discharge Frequent or constant Supervision/Assistance  ?Patient can return home with the following A little help with walking and/or transfers;A little help with bathing/dressing/bathroom;Assistance with cooking/housework;Assist for transportation ?  ?Equipment Recommendations ? Rolling walker (2 wheels)  ?  ?Recommendations for Other Services   ? ? ?  ?Precautions / Restrictions Precautions ?Precautions: Knee;Fall;Other (comment) ?Precaution Comments: LVAD, watch HR and BP ?Restrictions ?Weight Bearing Restrictions: No  ?  ? ?Mobility ? Bed Mobility ?Overal bed mobility: Needs Assistance ?Bed Mobility: Sit to Supine ?  ?  ?  ?Sit to supine: Min assist ?  ?General bed mobility comments: Light minA for RLE negotiation back into bed, pt hooking LLE underneath RLE to elevate ?  ? ?Transfers ?Overall transfer level: Needs assistance ?Equipment used: Rolling walker (2  wheels) ?Transfers: Sit to/from Stand ?Sit to Stand: Min assist ?  ?  ?  ?  ?  ?General transfer comment: Light minA to rise from recliner ?  ? ?Ambulation/Gait ?Ambulation/Gait assistance: Min guard ?Gait Distance (Feet): 200 Feet ?Assistive device: Rolling walker (2 wheels) ?Gait Pattern/deviations: Decreased stride length, Step-to pattern, Decreased stance time - right, Decreased weight shift to right, Antalgic, Step-through pattern ?Gait velocity: decreased ?Gait velocity interpretation: <1.8 ft/sec, indicate of risk for recurrent falls ?  ?General Gait Details: Progressing to step through pattern, cues for upward gaze and progressive weightbearing through BUE's. Decreased L step length. pt requiring 2 standing rest breaks ? ? ?Stairs ?  ?  ?  ?  ?  ? ? ?Wheelchair Mobility ?  ? ?Modified Rankin (Stroke Patients Only) ?  ? ? ?  ?Balance Overall balance assessment: Needs assistance ?Sitting-balance support: No upper extremity supported, Feet supported ?Sitting balance-Leahy Scale: Good ?  ?  ?Standing balance support: Bilateral upper extremity supported, During functional activity ?Standing balance-Leahy Scale: Fair ?  ?  ?  ?  ?  ?  ?  ?  ?  ?  ?  ?  ?  ? ?  ?Cognition Arousal/Alertness: Awake/alert ?Behavior During Therapy: [facility] for tasks assessed/performed ?Overall Cognitive Status: Within Functional Limits for tasks assessed ?  ?  ?  ?  ?  ?  ?  ?  ?  ?  ?  ?  ?  ?  ?  ?  ?  ?  ?  ? ?  ?Exercises Total Joint Exercises ?Ankle Circles/Pumps: Both, 15 reps, Supine ?Quad Sets: Both, 15 reps, Supine ?Heel Slides: AAROM, Right, Supine, 15 reps, Seated ?Hip ABduction/ADduction: Right,  10 reps, Supine ?Long Arc Quad: Right, 10 reps, Seated ? ?  ?General Comments   ?  ?  ? ?Pertinent Vitals/Pain Pain Assessment ?Pain Assessment: Faces ?Faces Pain Scale: Hurts little more ?Pain Location: Rt knee ?Pain Descriptors / Indicators: Aching, Guarding ?Pain Intervention(s): Monitored during session  ? ? ?Home Living   ?  ?  ?   ?  ?  ?  ?  ?  ?  ?   ?  ?Prior Function    ?  ?  ?   ? ?PT Goals (current goals can now be found in the care plan section) Acute Rehab PT Goals ?Patient Stated Goal: return to volleyball ref ?Potential to Achieve Goals: Good ?Progress towards PT goals: Progressing toward goals ? ?  ?Frequency ? ? ? 7X/week ? ? ? ?  ?PT Plan Current plan remains appropriate  ? ? ?Co-evaluation   ?  ?  ?  ?  ? ?  ?AM-PAC PT "6 Clicks" Mobility   ?Outcome Measure ? Help needed turning from your back to your side while in a flat bed without using bedrails?: None ?Help needed moving from lying on your back to sitting on the side of a flat bed without using bedrails?: None ?Help needed moving to and from a bed to a chair (including a wheelchair)?: A Little ?Help needed standing up from a chair using your arms (e.g., wheelchair or bedside chair)?: A Little ?Help needed to walk in hospital room?: A Little ?Help needed climbing 3-5 steps with a railing? : A Little ?6 Click Score: 20 ? ?  ?End of Session Equipment Utilized During Treatment: Gait belt ?Activity Tolerance: Patient tolerated treatment well ?Patient left: with call bell/phone within reach;in bed ?Nurse Communication: Mobility status ?PT Visit Diagnosis: Other abnormalities of gait and mobility (R26.89);Difficulty in walking, not elsewhere classified (R26.2);Muscle weakness (generalized) (M62.81) ?  ? ? ?Time: 0240-9735 ?PT Time Calculation (min) (ACUTE ONLY): 27 min ? ?Charges:  $Gait Training: 8-22 mins ?$Therapeutic Exercise: 8-22 mins ?$Therapeutic Activity: 8-22 mins          ?          ? ?Wyona Almas, PT, DPT ?Acute Rehabilitation Services ?Pager [phone] ?Office [phone] ? ? ? ?Johnny Oneill ?12/10/2021, 5:09 PM ? ?

## 2021-12-11 LAB — BASIC METABOLIC PANEL
Anion gap: 6 (ref 5–15)
BUN: 34 mg/dL — ABNORMAL HIGH (ref 8–23)
CO2: 25 mmol/L (ref 22–32)
Calcium: 8.7 mg/dL — ABNORMAL LOW (ref 8.9–10.3)
Chloride: 103 mmol/L (ref 98–111)
Creatinine, Ser: 2.22 mg/dL — ABNORMAL HIGH (ref 0.61–1.24)
GFR, Estimated: 30 mL/min — ABNORMAL LOW (ref >60)
Glucose, Bld: 117 mg/dL — ABNORMAL HIGH (ref 70–99)
Potassium: 4.5 mmol/L (ref 3.5–5.1)
Sodium: 134 mmol/L — ABNORMAL LOW (ref 135–145)

## 2021-12-11 LAB — PROTIME-INR
INR: 2.3 — ABNORMAL HIGH (ref 0.8–1.2)
Prothrombin Time: 25.2 seconds — ABNORMAL HIGH (ref 11.4–15.2)

## 2021-12-11 LAB — CBC
HCT: 23.8 % — ABNORMAL LOW (ref 39.0–52.0)
Hemoglobin: 7.6 g/dL — ABNORMAL LOW (ref 13.0–17.0)
MCH: 29.9 pg (ref 26.0–34.0)
MCHC: 31.9 g/dL (ref 30.0–36.0)
MCV: 93.7 fL (ref 80.0–100.0)
Platelets: 235 10*3/uL (ref 150–400)
RBC: 2.54 MIL/uL — ABNORMAL LOW (ref 4.22–5.81)
RDW: 14.6 % (ref 11.5–15.5)
WBC: 7.3 10*3/uL (ref 4.0–10.5)
nRBC: 0 % (ref 0.0–0.2)

## 2021-12-11 LAB — MAGNESIUM: Magnesium: 1.9 mg/dL (ref 1.7–2.4)

## 2021-12-11 LAB — LACTATE DEHYDROGENASE: LDH: 249 U/L — ABNORMAL HIGH (ref 98–192)

## 2021-12-11 LAB — PREPARE RBC (CROSSMATCH)

## 2021-12-11 LAB — HEPARIN LEVEL (UNFRACTIONATED): Heparin Unfractionated: 0.23 IU/mL — ABNORMAL LOW (ref 0.30–0.70)

## 2021-12-11 LAB — URINE CULTURE: Culture: NO GROWTH

## 2021-12-11 MED ORDER — WARFARIN SODIUM 5 MG PO TABS: 5.0000 mg | ORAL_TABLET | Freq: Once | ORAL | Status: DC

## 2021-12-11 MED ORDER — WARFARIN SODIUM 2.5 MG PO TABS
2.5000 mg | ORAL_TABLET | Freq: Once | ORAL | Status: AC
  Administered 2021-12-11: 2.5 mg via ORAL
  Filled 2021-12-11: qty 1

## 2021-12-11 MED ORDER — SODIUM CHLORIDE 0.9% IV SOLUTION: Freq: Once | INTRAVENOUS | Status: AC

## 2021-12-11 NOTE — Progress Notes (Addendum)
ANTICOAGULATION CONSULT NOTE ?Pharmacy Consult for heparin  ?Indication: LVAD ? ?Allergies  ?Allergen Reactions  ? Ace Inhibitors   ?  Hypotension and elevated creatinine  ? Amiodarone Other (See Comments)  ?  Cerebellar side effects  ? Diovan [Valsartan] Other (See Comments)  ?  Hypotension at low dose, hyperkalemia  ? Lipitor [Atorvastatin Calcium] Other (See Comments)  ?  Muscle pain  ? Jardiance [Empagliflozin] Other (See Comments)  ?  Unsure of exact side effect  ? Norvasc [Amlodipine] Other (See Comments)  ?  Put patient to sleep  ? ? ?Patient Measurements: ?Height: 5\' 7"  (170.2 cm) ?Weight: 72.4 kg (159 lb 9.8 oz) ?IBW/kg (Calculated) : 66.1 ? Heparin dosing weight: 71.1 kg ? ?Vital Signs: ?Temp: 98.3 ?F (36.8 ?C) (04/13 0447) ?Temp Source: Oral (04/13 0447) ?BP: 106/87 (04/13 0447) ?Pulse Rate: 67 (04/13 0447) ? ?Labs: ?Recent Labs  ?  12/09/21 ?0037 12/10/21 ?0107 12/11/21 ?9458  ?HGB 8.0* 8.4* 7.6*  ?HCT 24.2* 25.2* 23.8*  ?PLT 171 182 235  ?LABPROT 18.2* 20.0* 25.2*  ?INR 1.5* 1.7* 2.3*  ?HEPARINUNFRC 0.28* 0.25* 0.23*  ?CREATININE 2.19* 2.19* 2.22*  ? ? ? ?Estimated Creatinine Clearance: 25.6 mL/min (A) (by C-G formula based on SCr of 2.22 mg/dL (H)). ? ? 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 Pharmacy consulted to dose heparin while INR < 2. Underwent R TKR on 4/5 (last dose of warfarin given 3/30).  ? ?INR therapeutic at 2.3 this afternoon, heparin level theapeutic, CBC down slightly and LDH stable. Pt to stay admitted and get pRBCs x1. ? ?PTA warfarin dose: 5 mg daily ? 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  ?Plan:  ?-Stop heparin ?-Resume warfarin 2.5mg  x1 tonight - will give reduced dose with rapid rise ?-INR check next week if discharged ? ? ? ?Arrie Senate, PharmD, BCPS, BCCP ?Clinical Pharmacist ?[phone] ?Please check AMION for all [place] numbers ?12/11/2021 ? ? ? ? ? ?

## 2021-12-11 NOTE — Progress Notes (Signed)
Physical Therapy Treatment ?Patient Details ?Name: Johnny Oneill ?MRN: [id number] ?DOB: 1941/05/21 ?Today's Date: 12/11/2021 ? ? ?History of Present Illness 81 yo admitted 4/3 with Rt knee OA for optimization prior to TKA performed 4/5. PMhx: CKD, CHF, HTN, LVAD HM II 2015, HLD, CAD, Lt TKA, ICD ? ?  ?PT Comments  ? ? Pt was able to progress to navigating x4 stairs with bil handrails and ambulating with improved upright posture, R lower extremity WB tolerance, and stride length with RW this afternoon. He continues to need minA for transfers and guarding for safety with gait and stair navigation as he is at risk for falls with deficits in strength, R knee ROM, and balance. Educated pt on his need for this assistance at home. Will continue to follow acutely.  ? ?  ?Recommendations for follow up therapy are one component of a multi-disciplinary discharge planning process, led by the attending physician.  Recommendations may be updated based on patient status, additional functional criteria and insurance authorization. ? ?Follow Up Recommendations ? Follow physician's recommendations for discharge plan and follow up therapies ?  ?  ?Assistance Recommended at Discharge Frequent or constant Supervision/Assistance  ?Patient can return home with the following A little help with walking and/or transfers;A little help with bathing/dressing/bathroom;Assistance with cooking/housework;Assist for transportation;Help with stairs or ramp for entrance ?  ?Equipment Recommendations ? Rolling walker (2 wheels)  ?  ?Recommendations for Other Services   ? ? ?  ?Precautions / Restrictions Precautions ?Precautions: Knee;Fall;Other (comment) ?Precaution Comments: LVAD, watch HR and BP ?Restrictions ?Weight Bearing Restrictions: Yes ?RLE Weight Bearing: Weight bearing as tolerated  ?  ? ?Mobility ? Bed Mobility ?Overal bed mobility: Needs Assistance ?Bed Mobility: Sit to Supine, Supine to Sit ?  ?  ?Supine to sit: Min guard, HOB elevated ?Sit  to supine: HOB elevated, Min guard ?  ?General bed mobility comments: pt hooking LLE underneath RLE to elevate onto bed, extra time but min guard assist only. Min guard with extra time to sit up EOB, HOB elevated ?  ? ?Transfers ?Overall transfer level: Needs assistance ?Equipment used: Rolling walker (2 wheels) ?Transfers: Sit to/from Stand ?Sit to Stand: Min assist ?  ?  ?  ?  ?  ?General transfer comment: MinA to power up to stand from EOB, pt placing R foot anterior to L with transfers for comfort. Pt needing heavier minA for first rep but progressed to light minA with subsequent reps ?  ? ?Ambulation/Gait ?Ambulation/Gait assistance: Min guard ?Gait Distance (Feet): 230 Feet ?Assistive device: Rolling walker (2 wheels) ?Gait Pattern/deviations: Decreased stride length, Decreased stance time - right, Decreased weight shift to right, Antalgic, Step-through pattern, Decreased dorsiflexion - left, Trunk flexed ?Gait velocity: decreased ?Gait velocity interpretation: <1.8 ft/sec, indicate of risk for recurrent falls ?  ?General Gait Details: Pt with improved R stance time and stride length with step through gait pattern this afternoon. However, continues to perform an antalgic gait pattern. No LOB, min guard for safety ? ? ?Stairs ?Stairs: Yes ?Stairs assistance: Min guard ?Stair Management: Two rails, Step to pattern, Forwards ?Number of Stairs: 4 ?General stair comments: Cues provided to lead up with L leg and down with R for comfort and ease, good carryover noted. Min guard assist for step-to pattern using bil handrails. Pt turning body sideways slightly and needing increased time to descend, but no LOB. ? ? ?Wheelchair Mobility ?  ? ?Modified Rankin (Stroke Patients Only) ?  ? ? ?  ?Balance Overall balance  assessment: Needs assistance ?Sitting-balance support: No upper extremity supported, Feet supported ?Sitting balance-Leahy Scale: Good ?  ?  ?Standing balance support: Bilateral upper extremity supported,  During functional activity, No upper extremity supported ?Standing balance-Leahy Scale: Fair ?Standing balance comment: Able to stand statically without UE support but uses RW for mobility ?  ?  ?  ?  ?  ?  ?  ?  ?  ?  ?  ?  ? ?  ?Cognition Arousal/Alertness: Awake/alert ?Behavior During Therapy: [facility] for tasks assessed/performed ?Overall Cognitive Status: Within Functional Limits for tasks assessed ?  ?  ?  ?  ?  ?  ?  ?  ?  ?  ?  ?  ?  ?  ?  ?  ?  ?  ?  ? ?  ?Exercises Other Exercises ?Other Exercises: Placed pt in CPM end of session with range of 0-80 degrees as pt tolerated ? ?  ?General Comments General comments (skin integrity, edema, etc.): only slight brief lightheadedness upon initially coming to stand, but no further lightheadedness throughout session; educated pt to have someone assist him with transfers and guard him with gait and stairs for safety; educated pt on use of knee bone foam or positioning of knee extended at rest ?  ?  ? ?Pertinent Vitals/Pain Pain Assessment ?Pain Assessment: Faces ?Faces Pain Scale: Hurts even more ?Pain Location: Rt knee ?Pain Descriptors / Indicators: Aching, Guarding, Operative site guarding ?Pain Intervention(s): Limited activity within patient's tolerance, Monitored during session, Repositioned, Ice applied  ? ? ?Home Living   ?  ?  ?  ?  ?  ?  ?  ?  ?  ?   ?  ?Prior Function    ?  ?  ?   ? ?PT Goals (current goals can now be found in the care plan section) Acute Rehab PT Goals ?Patient Stated Goal: to go home ?PT Goal Formulation: With patient ?Time For Goal Achievement: 12/18/21 ?Potential to Achieve Goals: Good ?Progress towards PT goals: Progressing toward goals ? ?  ?Frequency ? ? ? 7X/week ? ? ? ?  ?PT Plan Current plan remains appropriate  ? ? ?Co-evaluation   ?  ?  ?  ?  ? ?  ?AM-PAC PT "6 Clicks" Mobility   ?Outcome Measure ? Help needed turning from your back to your side while in a flat bed without using bedrails?: None ?Help needed moving from lying on your  back to sitting on the side of a flat bed without using bedrails?: A Little ?Help needed moving to and from a bed to a chair (including a wheelchair)?: A Little ?Help needed standing up from a chair using your arms (e.g., wheelchair or bedside chair)?: A Little ?Help needed to walk in hospital room?: A Little ?Help needed climbing 3-5 steps with a railing? : A Little ?6 Click Score: 19 ? ?  ?End of Session   ?Activity Tolerance: Patient tolerated treatment well ?Patient left: with call bell/phone within reach;in bed;with bed alarm set ?  ?PT Visit Diagnosis: Other abnormalities of gait and mobility (R26.89);Difficulty in walking, not elsewhere classified (R26.2);Muscle weakness (generalized) (M62.81) ?  ? ? ?Time: 7824-2353 ?PT Time Calculation (min) (ACUTE ONLY): 31 min ? ?Charges:  $Gait Training: 23-37 mins          ?          ? ?Moishe Spice, PT, DPT ?Acute Rehabilitation Services  ?Pager: [phone] ?Office: [phone] ? ? ? ?Maretta Bees Pettis ?  12/11/2021, 4:57 PM ? ?

## 2021-12-11 NOTE — Progress Notes (Signed)
Physical Therapy Treatment ?Patient Details ?Name: Johnny Oneill ?MRN: [id number] ?DOB: 1940-11-14 ?Today's Date: 12/11/2021 ? ? ?History of Present Illness 81 yo admitted 4/3 with Rt knee OA for optimization prior to TKA performed 4/5. PMhx: CKD, CHF, HTN, LVAD HM II 2015, HLD, CAD, Lt TKA, ICD ? ?  ?PT Comments  ? ? Pt not limited by dizziness/lightheadedness today, see BP measures below. He was able to ambulate ~320 ft with min guard assist using the RW, displaying a step-through pattern majority of the time. He does continue to display decreased R weight shift and stance time and need several standing rest breaks secondary to R knee pain. Completed session with placing pt on CPM to improve R knee ROM. Will continue to follow acutely. Current recommendations remain appropriate.  ? ?BP:  ?99/86 supine ?111/84 sitting ?104/83 standing ? ?  ?Recommendations for follow up therapy are one component of a multi-disciplinary discharge planning process, led by the attending physician.  Recommendations may be updated based on patient status, additional functional criteria and insurance authorization. ? ?Follow Up Recommendations ? Follow physician's recommendations for discharge plan and follow up therapies ?  ?  ?Assistance Recommended at Discharge Frequent or constant Supervision/Assistance  ?Patient can return home with the following A little help with walking and/or transfers;A little help with bathing/dressing/bathroom;Assistance with cooking/housework;Assist for transportation ?  ?Equipment Recommendations ? Rolling walker (2 wheels)  ?  ?Recommendations for Other Services   ? ? ?  ?Precautions / Restrictions Precautions ?Precautions: Knee;Fall;Other (comment) ?Precaution Comments: LVAD, watch HR and BP ?Restrictions ?Weight Bearing Restrictions: Yes ?RLE Weight Bearing: Weight bearing as tolerated  ?  ? ?Mobility ? Bed Mobility ?Overal bed mobility: Needs Assistance ?Bed Mobility: Sit to Supine, Supine to Sit ?  ?   ?Supine to sit: Min guard, HOB elevated ?Sit to supine: Min assist, HOB elevated ?  ?General bed mobility comments: Light minA for RLE negotiation back into bed, pt hooking LLE underneath RLE to elevate. Min guard with extra time to sit up EOB. ?  ? ?Transfers ?Overall transfer level: Needs assistance ?Equipment used: Rolling walker (2 wheels) ?Transfers: Sit to/from Stand ?Sit to Stand: Min assist ?  ?  ?  ?  ?  ?General transfer comment: MinA to power up to stand from EOB, pt placing R foot anterior to L with transfers for comfort. ?  ? ?Ambulation/Gait ?Ambulation/Gait assistance: Min guard ?Gait Distance (Feet): 320 Feet ?Assistive device: Rolling walker (2 wheels) ?Gait Pattern/deviations: Decreased stride length, Step-to pattern, Decreased stance time - right, Decreased weight shift to right, Antalgic, Step-through pattern, Decreased dorsiflexion - left, Trunk flexed ?Gait velocity: decreased ?Gait velocity interpretation: <1.8 ft/sec, indicate of risk for recurrent falls ?  ?General Gait Details: Pt with step through gait initially and at the end, but did perform step-to briefly in the middle of the gait bout. Cues provided to improve upright posture, look superiorly, and increase R knee flexion during swing and L foot clearance and heel strike with swing, mod success. No LOB, min guard for safety. x2 standing rest breaks due to knee pain. ? ? ?Stairs ?  ?  ?  ?  ?  ? ? ?Wheelchair Mobility ?  ? ?Modified Rankin (Stroke Patients Only) ?  ? ? ?  ?Balance Overall balance assessment: Needs assistance ?Sitting-balance support: No upper extremity supported, Feet supported ?Sitting balance-Leahy Scale: Good ?  ?  ?Standing balance support: Bilateral upper extremity supported, During functional activity ?Standing balance-Leahy Scale: Poor ?Standing balance  comment: RW for gait ?  ?  ?  ?  ?  ?  ?  ?  ?  ?  ?  ?  ? ?  ?Cognition Arousal/Alertness: Awake/alert ?Behavior During Therapy: [facility] for tasks  assessed/performed ?Overall Cognitive Status: Within Functional Limits for tasks assessed ?  ?  ?  ?  ?  ?  ?  ?  ?  ?  ?  ?  ?  ?  ?  ?  ?  ?  ?  ? ?  ?Exercises Other Exercises ?Other Exercises: Placed pt in CPM end of session with range of 0-80 degrees as pt tolerated ? ?  ?General Comments General comments (skin integrity, edema, etc.): BP 99/86 supine, 111/84 sitting, 104/83 standing; denied dizziness/lightheadedness throughout ?  ?  ? ?Pertinent Vitals/Pain Pain Assessment ?Pain Assessment: Faces ?Faces Pain Scale: Hurts even more ?Pain Location: Rt knee ?Pain Descriptors / Indicators: Aching, Guarding, Operative site guarding ?Pain Intervention(s): Monitored during session, Limited activity within patient's tolerance, Repositioned  ? ? ?Home Living   ?  ?  ?  ?  ?  ?  ?  ?  ?  ?   ?  ?Prior Function    ?  ?  ?   ? ?PT Goals (current goals can now be found in the care plan section) Acute Rehab PT Goals ?Patient Stated Goal: to improve ?PT Goal Formulation: With patient ?Time For Goal Achievement: 12/18/21 ?Potential to Achieve Goals: Good ?Progress towards PT goals: Progressing toward goals ? ?  ?Frequency ? ? ? 7X/week ? ? ? ?  ?PT Plan Current plan remains appropriate  ? ? ?Co-evaluation   ?  ?  ?  ?  ? ?  ?AM-PAC PT "6 Clicks" Mobility   ?Outcome Measure ? Help needed turning from your back to your side while in a flat bed without using bedrails?: None ?Help needed moving from lying on your back to sitting on the side of a flat bed without using bedrails?: A Little ?Help needed moving to and from a bed to a chair (including a wheelchair)?: A Little ?Help needed standing up from a chair using your arms (e.g., wheelchair or bedside chair)?: A Little ?Help needed to walk in hospital room?: A Little ?Help needed climbing 3-5 steps with a railing? : A Little ?6 Click Score: 19 ? ?  ?End of Session Equipment Utilized During Treatment: Gait belt ?Activity Tolerance: Patient tolerated treatment well ?Patient left:  with call bell/phone within reach;in bed;with bed alarm set;Other (comment) (with R knee in CPM) ?  ?PT Visit Diagnosis: Other abnormalities of gait and mobility (R26.89);Difficulty in walking, not elsewhere classified (R26.2);Muscle weakness (generalized) (M62.81) ?  ? ? ?Time: 2355-7322 ?PT Time Calculation (min) (ACUTE ONLY): 34 min ? ?Charges:  $Gait Training: 8-22 mins ?$Therapeutic Activity: 8-22 mins          ?          ? ?Moishe Spice, PT, DPT ?Acute Rehabilitation Services  ?Pager: [phone] ?Office: [phone] ? ? ? ?Maretta Bees Pettis ?12/11/2021, 12:14 PM ? ?

## 2021-12-11 NOTE — Plan of Care (Signed)
  Problem: Education: Goal: Knowledge of General Education information will improve Description: Including pain rating scale, medication(s)/side effects and non-pharmacologic comfort measures Outcome: Progressing   Problem: Health Behavior/Discharge Planning: Goal: Ability to manage health-related needs will improve Outcome: Progressing   Problem: Clinical Measurements: Goal: Ability to maintain clinical measurements within normal limits will improve Outcome: Progressing Goal: Will remain free from infection Outcome: Progressing   Problem: Activity: Goal: Risk for activity intolerance will decrease Outcome: Progressing   Problem: Nutrition: Goal: Adequate nutrition will be maintained Outcome: Progressing   Problem: Pain Managment: Goal: General experience of comfort will improve Outcome: Progressing   Problem: Safety: Goal: Ability to remain free from injury will improve Outcome: Progressing   Problem: Skin Integrity: Goal: Risk for impaired skin integrity will decrease Outcome: Progressing   

## 2021-12-11 NOTE — Progress Notes (Signed)
LVAD Coordinator Rounding Note: ? ?Admitted 12/01/21 to Dr. Haroldine Laws service for heparin bridge for scheduled right total knee arthroplasty. ? 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 Darcey [name] under bridge to transplant criteria. Patient was removed from transplant list 05/2016 per his choice. He is currently Destination Therapy LVAD.  ? ?Patient laying in bed this morning on my arrival. States he sleep ok last night. Denies headache and dizziness currently. Receiving Midodrine 10 mg TID. Has not been out of bed yet this morning with PT.  Pt seems very distant this morning. ? ?Home health PT arranged with [place] per Edwin Cap RN Case Manager's note on 12/05/21.  ? ?Vital signs       ?Temp: 97.8 ?HR: 80 AV paced    ?Doppler Pressure: 96    ?Automatic BP: 117/96 (105)     ?O2 Sat: 99% on RA ?Wt: 156.7>157.4>158.5>165>168>171.3>162.2>159.6 lbs   ? ?LVAD interrogation reveals:  ?Speed:  9600 ?Flow: 4.1 ?Power: 5.4 w ?PI: 7.2 ?Alarms: none ?Events: 1 PI event so far today ? ?Fixed speed:  9600 ?Low speed limit: 9000 ? ?Drive Line:  Drive line is being maintained weekly by [name], his wife. Existing VAD dressing C/D/I Sorbaview dressing and Silverlon patch intact. Exit site healed and incorporated, the velour is fully implanted at exit site. Pt denies fever or chills. Weekly dressing changes per [facility] nurse or caregiver. Next dressing change due 12/15/21. ? ?Labs:  ?LDH trend: 249>282>172>200>144>245>251>265>249 ? ?INR trend:  1.4>1.3>1.3>1.2>1.3>1.4>1.5>1.7>2.3 ? ?Anticoagulation Plan: ?-INR Goal:  2.5 - 3.0 ?-ASA Dose: 81 mg daily ? ? ?Device: Medtronic dual ?- Therapies: on 231 bpm ?- Pacing:  AAI<=>DDD 80 bpm ? ?Gtts:  ?- Heparin - 1200 units/hr-off 4/13 ? ? ?Plan/Recommendations:  ?Call VAD Coordinator with any VAD equipment or drive line issues. ?Weekly dressing changes per [facility] nurse or caregiver.  ? ?Tanda Rockers RN ?VAD Coordinator  ?Office: [phone]  ?24/7 Pager: [phone]  ? ? ? ? ? ? ? ? ? ?

## 2021-12-11 NOTE — Progress Notes (Signed)
Subjective: ?8 Days Post-Op Procedure(s) (LRB): ?TOTAL KNEE ARTHROPLASTY (Right) ?Patient reports pain as mild and moderate.  Still occasional dizziness. ? ?Objective: ?Vital signs in last 24 hours: ?Temp:  [97.5 ?F (36.4 ?C)-98.3 ?F (36.8 ?C)] 98 ?F (36.7 ?C) (04/13 1930) ?Pulse Rate:  [67-81] 74 (04/13 1930) ?Resp:  [14-19] 18 (04/13 1930) ?BP: (106-124)/(87-97) 124/95 (04/13 1930) ?SpO2:  [96 %-99 %] 98 % (04/13 1930) ?Weight:  [72.4 kg] 72.4 kg (04/13 0459) ? ?Intake/Output from previous day: ?04/12 0701 - 04/13 0700 ?In: 879.7 [P.O.:390; I.V.:239.7; IV Piggyback:250] ?Out: 1250 [Urine:1250] ?Intake/Output this shift: ?Total I/O ?In: -  ?Out: 700 [Urine:700] ? ?Recent Labs  ?  12/09/21 ?0037 12/10/21 ?0107 12/11/21 ?7628  ?HGB 8.0* 8.4* 7.6*  ? ?Recent Labs  ?  12/10/21 ?0107 12/11/21 ?3151  ?WBC 6.6 7.3  ?RBC 2.70* 2.54*  ?HCT 25.2* 23.8*  ?PLT 182 235  ? ?Recent Labs  ?  12/10/21 ?0107 12/11/21 ?7616  ?NA 135 134*  ?K 4.3 4.5  ?CL 104 103  ?CO2 24 25  ?BUN 37* 34*  ?CREATININE 2.19* 2.22*  ?GLUCOSE 111* 117*  ?CALCIUM 8.8* 8.7*  ? ?Recent Labs  ?  12/10/21 ?0107 12/11/21 ?0737  ?INR 1.7* 2.3*  ? ? ?Sensation intact distally ?Dorsiflexion/Plantar flexion intact ?Incision: dressing C/D/I and no drainage ?No cellulitis present ? ? ?Assessment/Plan: ?8 Days Post-Op Procedure(s) (LRB): ?TOTAL KNEE ARTHROPLASTY (Right) ? ?Up with therapy WBAT ?Encourage ROM knee CPM machine, leg extension pillow if tolerated. ?Pain control oxycodone, tylenol ?Appreciate excellent management with cards/med dispo will be per primary team, but plan is home with HHPT to start. ?Dsg change prn, has aquacel dsg which may stay in place until first po visit if not too much drainage beneath.   ?Will continue to see daily while inpatient.  ?  ?  ?Anticipated LOS equal to or greater than 2 midnights due to ?- Age 79 and older with one or more of the following: ?            - Obesity ?            - Expected need for hospital services (PT, OT,  Nursing) required for safe   discharge ?            - Anticipated need for postoperative skilled nursing care or inpatient rehab ?            - Active co-morbidities: Heart Failure and Cardiac Arrhythmia ?OR  ?  ?- Unanticipated findings during/Post Surgery: None  ?- Patient is a high risk of re-admission due to: 2 or more ED visits within previous 6 months ?  ? ? ?Chriss Czar ?12/11/2021, 9:36 PM ? ?

## 2021-12-11 NOTE — Progress Notes (Addendum)
?Advanced Heart Failure VAD Team Note ? ?PCP-Cardiologist: None  ? ?Subjective:   ? ?- S/p R total knee replacement 04/05 ?- 4/7 transfused 1u RBCs  ?-4/10 Midodrine cut back to 5 mg tid ?- 4/11 Failed midodrine taper. Increased back to 10 mg tid.  ? ?On heparin gtt + warfarin. INR 2.3  ? ?Hgb 7.6  ? LDH 144> 290>203>245>251>265>249  ? ?Scr stable at 2.2.  ? ?MAPs now 90s ? ?Feeling ok today  ? ? ?LVAD INTERROGATION:  ?HeartMate II LVAD:   ?Flow 4.5  liters/min, speed 9600, power 6, PI 6.5  ? ?Objective:   ? ?Vital Signs:   ?Temp:  [97.5 ?F (36.4 ?C)-98.3 ?F (36.8 ?C)] 98.3 ?F (36.8 ?C) (04/13 0447) ?Pulse Rate:  [67-81] 67 (04/13 0447) ?Resp:  [17-20] 19 (04/13 0447) ?BP: (105-113)/(87-99) 106/87 (04/13 0447) ?SpO2:  [96 %-99 %] 96 % (04/13 0447) ?Weight:  [72.4 kg] 72.4 kg (04/13 0459) ?Last BM Date : 12/10/21 ?Mean arterial Pressure 90s ? ?1Intake/Output:  ? ?Intake/Output Summary (Last 24 hours) at 12/11/2021 0739 ?Last data filed at 12/11/2021 0300 ?Gross per 24 hour  ?Intake 879.7 ml  ?Output 1250 ml  ?Net -370.3 ml  ?  ? ?Physical Exam  ? ? ?Physical Exam: ?GENERAL: No acute distress. ?HEENT: normal  ?NECK: Supple, JVP  6-7 .  2+ bilaterally, no bruits.  No lymphadenopathy or thyromegaly appreciated.   ?CARDIAC:  Mechanical heart sounds with LVAD hum present.  ?LUNGS:  Clear to auscultation bilaterally.  ?ABDOMEN:  Soft, round, nontender, positive bowel sounds x4.     ?LVAD exit site: well-healed and incorporated.  Dressing dry and intact.  No erythema or drainage.  Stabilization device present and accurately applied.  Driveline dressing is being changed daily per sterile technique. ?EXTREMITIES:  Warm and dry, no cyanosis, clubbing, rash. RLE knee dressing 1+  edema  ?NEUROLOGIC:  Alert and oriented x 3.    No aphasia.  No dysarthria.  Affect pleasant.    ? ? ? ?Telemetry  ? ?AV paced wit occasional PVCs. 60-80s   ? ?Labs  ? ?Basic Metabolic Panel: ?Recent Labs  ?Lab 12/07/21 ?0108 12/08/21 ?0105  12/09/21 ?0037 12/10/21 ?0107 12/11/21 ?2774  ?NA 132* 135 137 135 134*  ?K 5.0 4.2 4.5 4.3 4.5  ?CL 102 104 105 104 103  ?CO2 22 23 26 24 25   ?GLUCOSE 116* 125* 109* 111* 117*  ?BUN 39* 41* 40* 37* 34*  ?CREATININE 2.27* 2.30* 2.19* 2.19* 2.22*  ?CALCIUM 8.6* 8.6* 8.8* 8.8* 8.7*  ?MG 2.1 2.0 2.0 2.0 1.9  ? ? ?Liver Function Tests: ?No results for input(s): AST, ALT, ALKPHOS, BILITOT, PROT, ALBUMIN in the last 168 hours. ?No results for input(s): LIPASE, AMYLASE in the last 168 hours. ?No results for input(s): AMMONIA in the last 168 hours. ? ?CBC: ?Recent Labs  ?Lab 12/08/21 ?0105 12/08/21 ?1409 12/09/21 ?0037 12/10/21 ?0107 12/11/21 ?1287  ?WBC 6.5 6.5 5.6 6.6 7.3  ?HGB 7.5* 8.2* 8.0* 8.4* 7.6*  ?HCT 23.3* 24.3* 24.2* 25.2* 23.8*  ?MCV 94.0 93.1 93.1 93.3 93.7  ?PLT 158 172 171 182 235  ? ? ?INR: ?Recent Labs  ?Lab 12/07/21 ?0108 12/08/21 ?0105 12/09/21 ?0037 12/10/21 ?0107 12/11/21 ?8676  ?INR 1.3* 1.4* 1.5* 1.7* 2.3*  ? ? ?Other results: ? ? ? ?Imaging  ? ?No results found. ? ? ?Medications:   ? ? ?Scheduled Medications: ? aspirin EC  81 mg Oral q AM  ? citalopram  10 mg Oral Daily  ?  docusate sodium  100 mg Oral BID  ? levothyroxine  175 mcg Oral Q0600  ? mexiletine  150 mg Oral BID  ? midodrine  10 mg Oral TID WC  ? pantoprazole  40 mg Oral Daily  ? rosuvastatin  20 mg Oral QPM  ? sodium chloride flush  3 mL Intravenous Q12H  ? sotalol  80 mg Oral Daily  ? warfarin  5 mg Oral ONCE-1600  ? Warfarin - Pharmacist Dosing Inpatient   Does not apply H6314  ? ? ?Infusions: ? sodium chloride    ? ? ?PRN Medications: ?sodium chloride, acetaminophen, HYDROmorphone (DILAUDID) injection, magnesium hydroxide, melatonin, menthol-cetylpyridinium **OR** phenol, metoCLOPramide **OR** metoCLOPramide (REGLAN) injection, ondansetron (ZOFRAN) IV, oxyCODONE, polyethylene glycol, senna, sorbitol, traMADol, zolpidem ? ? ?Patient Profile  ?Mr Kyllo is a 70 year with a h/o CAD, chronic HFrEF with HMII LVAD, VT, A flutter,  hypothyroidism, HTN, CKD Stage IV, and OA. 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  ? ?Scheduled admit for TKR ? ? ?Assessment/Plan:   ? 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  ?-Orthostasis resolving. Switched to ultram with oxy ir for break through pain.  ?-  compression hose ?- Has walker for d/c . HHPT set up and outpatient PT.  ? ?2) Orthostatic hypotension ?- suspect vasoplegia ?- volume repleted ?- Failed midodrine wean.  ?-Continue midodrine 10 mg tid.   ?- compression hose ?  ?3) Chronic systolic HF: ICM, EF 97-02% s/p LVAD HM II implant 08/2013 for DT.   ?- Echo 3/18 EF 10-15% mild AI. Mod RV dysfunction. LVAD cannula OK ?- has struggled with RV failure however RHC 11/22 looked good (despite evidence of RVF on LE dopplers showing reflux ?- Volume status ok.  ?- Remains off all diuretics including spiro ?- Failed Jardiance due to volume depletion ?- He has not tolerated ACE-I or amlodipine or b-blocker in the past. ?  ?4) LVAD: HMII 08/2013 for DT. ?-  Admitted in May 2018 with elevated LDH with bival.  ?- Continue aspirin. ?- Off coumadin for TKR. Restarted post-op. ?- INR 2.3 . Stop heparin drip.  Continue warfarin  ?- LDH stable 251>265 >249  ?- Management as above ?  ?5) VT/Frequent PVCs ?- now s/p ICD gen change.  ?- Off amio due to cerebellar side effects.  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 Now on sotalol+ mexiletine.  ?- 06/16/21 recurrent VT. DC-CV x 1 ?- 5 minute run WCT 04/05 ? VT/AFL. Self terminated. ?- Keep K > 4 and Mag > 2. Stable.  ?  ?6) Paroxysmal Atrial flutter ?- patient paced out of AFL in 6/22. Atrial therapies enabled  ?- continue sotalol (on for VT). QTs slightly prolonged but stable ?- may need to consider ablation if recurs ?- AV paced  ?  ?7) Chronic anticoagulation: INR goal 2.0-3.0.    ?- INR 2.3 Plan as above. ?  ?8) Hypothyroidism:  ?- Followed by Dr Dwyane Dee.  ?- Continue levothyroxine.  ?  ?9)  Hyperlipidemia:  ?- Continue Crestor. Zetia stopped due to cost.  ?  ?10) CAD ?- No s/s angina ?- continue Crestor ?  ?11) CKD IV ?- Creatinine baseline 2.3 - 2.7 ?- Creatinine 2.2   ?- Follow BMET daily   ? ?12) Anemia, acute blood loss ?- Baseline Hgb 10-11 ?- 1 u RBCs on 04/07 ?- Hgb 8.4>7.6  today  ?- Type and screen. Give 1UPRBCs.  ? ? ?  Length of Stay: 10 ? ?Darrick Grinder, NP ?12/11/2021, 7:39 AM ? ?VAD Team --- VAD ISSUES ONLY--- ?Pager [phone] (7am - 7am) ? ?Advanced Heart Failure Team  ?Pager [phone] (M-F; 7a - 5p)  ?Please contact [facility] for night-coverage after hours (5p -7a ) and weekends on [email or web address] ? ?Patient seen and examined with the above-signed Advanced Practice Provider and/or Housestaff. I personally reviewed laboratory data, imaging studies and relevant notes. I independently examined the patient and formulated the important aspects of the plan. I have edited the note to reflect any of my changes or salient points. I have personally discussed the plan with the patient and/or family. ? ?Feels ok. Orthostasis resolved back on higher dose midodrine and switch of oxy to tramadol.  ? ?Remains on heparin and warfarin. INR 2.3. HGb down slightly but knee looks ok ? ?Denies CP or SOB ? ?General:  NAD.  ?HEENT: normal  ?Neck: supple. JVP not elevated.  Carotids 2+ bilat; no bruits. No lymphadenopathy or thryomegaly appreciated. ?Cor: LVAD hum.  ?Lungs: Clear. ?Abdomen: obese soft, nontender, non-distended. No hepatosplenomegaly. No bruits or masses. Good bowel sounds. Driveline site clean. Anchor in place.  ?Extremities: no cyanosis, clubbing, rash. Warm no edema  R knee with dressing ?Neuro: alert & oriented x 3. No focal deficits. Moves all 4 without problem  ? ?Can stop heparin. Will give 1u RBC. Continue midodrine. VAD interrogated personally. Parameters stable. Continue PT.  ? ?Glori Bickers, MD  ?9:37 AM ? ? ? ? ?

## 2021-12-12 ENCOUNTER — Other Ambulatory Visit (HOSPITAL_COMMUNITY): Payer: Self-pay

## 2021-12-12 LAB — LACTATE DEHYDROGENASE: LDH: 264 U/L — ABNORMAL HIGH (ref 98–192)

## 2021-12-12 LAB — BASIC METABOLIC PANEL
Anion gap: 5 (ref 5–15)
BUN: 32 mg/dL — ABNORMAL HIGH (ref 8–23)
CO2: 25 mmol/L (ref 22–32)
Calcium: 8.7 mg/dL — ABNORMAL LOW (ref 8.9–10.3)
Chloride: 104 mmol/L (ref 98–111)
Creatinine, Ser: 2.08 mg/dL — ABNORMAL HIGH (ref 0.61–1.24)
GFR, Estimated: 32 mL/min — ABNORMAL LOW (ref >60)
Glucose, Bld: 110 mg/dL — ABNORMAL HIGH (ref 70–99)
Potassium: 4.3 mmol/L (ref 3.5–5.1)
Sodium: 134 mmol/L — ABNORMAL LOW (ref 135–145)

## 2021-12-12 LAB — BPAM RBC
Blood Product Expiration Date: 202305082359
ISSUE DATE / TIME: 202304131137
Unit Type and Rh: 5100

## 2021-12-12 LAB — TYPE AND SCREEN
ABO/RH(D): O NEG
Antibody Screen: NEGATIVE
Unit division: 0

## 2021-12-12 LAB — CBC
HCT: 28.2 % — ABNORMAL LOW (ref 39.0–52.0)
Hemoglobin: 9.4 g/dL — ABNORMAL LOW (ref 13.0–17.0)
MCH: 30.4 pg (ref 26.0–34.0)
MCHC: 33.3 g/dL (ref 30.0–36.0)
MCV: 91.3 fL (ref 80.0–100.0)
Platelets: 266 10*3/uL (ref 150–400)
RBC: 3.09 MIL/uL — ABNORMAL LOW (ref 4.22–5.81)
RDW: 15.4 % (ref 11.5–15.5)
WBC: 6.9 10*3/uL (ref 4.0–10.5)
nRBC: 0 % (ref 0.0–0.2)

## 2021-12-12 LAB — PROTIME-INR
INR: 2.2 — ABNORMAL HIGH (ref 0.8–1.2)
Prothrombin Time: 24.6 seconds — ABNORMAL HIGH (ref 11.4–15.2)

## 2021-12-12 LAB — MAGNESIUM: Magnesium: 2 mg/dL (ref 1.7–2.4)

## 2021-12-12 MED ORDER — TRAMADOL HCL 50 MG PO TABS
50.0000 mg | ORAL_TABLET | Freq: Four times a day (QID) | ORAL | 5 refills | Status: DC | PRN
  Filled 2021-12-12: qty 40, 5d supply, fill #0

## 2021-12-12 MED ORDER — WARFARIN SODIUM 5 MG PO TABS: ORAL_TABLET | ORAL | 11 refills | Status: DC

## 2021-12-12 MED ORDER — OXYCODONE HCL 5 MG PO TABS
5.0000 mg | ORAL_TABLET | Freq: Two times a day (BID) | ORAL | 0 refills | Status: DC | PRN
  Filled 2021-12-12: qty 30, 15d supply, fill #0

## 2021-12-12 MED ORDER — WARFARIN SODIUM 7.5 MG PO TABS: 7.5000 mg | ORAL_TABLET | Freq: Once | ORAL | Status: DC

## 2021-12-12 MED ORDER — MIDODRINE HCL 10 MG PO TABS
10.0000 mg | ORAL_TABLET | Freq: Three times a day (TID) | ORAL | 3 refills | Status: DC
  Filled 2021-12-12: qty 50, 17d supply, fill #0

## 2021-12-12 NOTE — Plan of Care (Signed)
?  Problem: Education: ?Goal: Knowledge of General Education information will improve ?Description: Including pain rating scale, medication(s)/side effects and non-pharmacologic comfort measures ?Outcome: Completed/Met ?  ?Problem: Health Behavior/Discharge Planning: ?Goal: Ability to manage health-related needs will improve ?Outcome: Completed/Met ?  ?Problem: Clinical Measurements: ?Goal: Ability to maintain clinical measurements within normal limits will improve ?Outcome: Completed/Met ?Goal: Will remain free from infection ?Outcome: Completed/Met ?Goal: Diagnostic test results will improve ?Outcome: Completed/Met ?Goal: Respiratory complications will improve ?Outcome: Completed/Met ?Goal: Cardiovascular complication will be avoided ?Outcome: Completed/Met ?  ?Problem: Activity: ?Goal: Risk for activity intolerance will decrease ?Outcome: Completed/Met ?  ?Problem: Nutrition: ?Goal: Adequate nutrition will be maintained ?Outcome: Completed/Met ?  ?Problem: Elimination: ?Goal: Will not experience complications related to bowel motility ?Outcome: Completed/Met ?Goal: Will not experience complications related to urinary retention ?Outcome: Completed/Met ?  ?Problem: Pain Managment: ?Goal: General experience of comfort will improve ?Outcome: Completed/Met ?  ?Problem: Safety: ?Goal: Ability to remain free from injury will improve ?Outcome: Completed/Met ?  ?Problem: Education: ?Goal: Knowledge of the prescribed therapeutic regimen will improve ?Outcome: Completed/Met ?Goal: Individualized Educational Video(s) ?Outcome: Completed/Met ?  ?Problem: Activity: ?Goal: Ability to avoid complications of mobility impairment will improve ?Outcome: Completed/Met ?Goal: Range of joint motion will improve ?Outcome: Completed/Met ?  ?Problem: Clinical Measurements: ?Goal: Postoperative complications will be avoided or minimized ?Outcome: Completed/Met ?  ?Problem: Pain Management: ?Goal: Pain level will decrease with appropriate  interventions ?Outcome: Completed/Met ?  ?Problem: Skin Integrity: ?Goal: Will show signs of wound healing ?Outcome: Completed/Met ?  ?Problem: Acute Rehab PT Goals(only PT should resolve) ?Goal: Pt Will Go Supine/Side To Sit ?Outcome: Completed/Met ?Goal: Patient Will Transfer Sit To/From Stand ?Outcome: Completed/Met ?Goal: Pt Will Transfer Bed To Chair/Chair To Bed ?Outcome: Completed/Met ?Goal: Pt Will Ambulate ?Outcome: Completed/Met ?Goal: Pt Will Go Up/Down Stairs ?Outcome: Completed/Met ?Goal: Pt/caregiver will Perform Home Exercise Program ?Outcome: Completed/Met ?  ?Problem: Acute Rehab OT Goals (only OT should resolve) ?Goal: Pt. Will Perform Grooming ?Outcome: Completed/Met ?Goal: Pt. Will Perform Lower Body Bathing ?Outcome: Completed/Met ?Goal: Pt. Will Perform Lower Body Dressing ?Outcome: Completed/Met ?Goal: Pt. Will Transfer To Toilet ?Outcome: Completed/Met ?  ?

## 2021-12-12 NOTE — Progress Notes (Signed)
LVAD Coordinator Rounding Note: ? ?Admitted 12/01/21 to Dr. Haroldine Laws service for heparin bridge for scheduled right total knee arthroplasty. ? 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 Darcey [name] under bridge to transplant criteria. Patient was removed from transplant list 05/2016 per his choice. He is currently Destination Therapy LVAD.  ? ?Patient laying in bed this morning on my arrival. States he feels ready to go home. Denies headache and dizziness. Receiving Midodrine 10 mg TID. Pt received 1 unit of blood yesterday. States he was able to work with PT yesterday with no problems. Hospital f/u made for Tuesday since pt will need INR check early next week. ? ?Home health PT arranged with [facility] per Edwin Cap RN Case Manager's note on 12/05/21.  ? ?Vital signs       ?Temp: 98 ?HR: 80 AV paced    ?Doppler Pressure: 104    ?Automatic BP: 112/89 (98)     ?O2 Sat: 98% on RA ?Wt: 156.7>157.4>158.5>165>168>171.3>162.2>159.6>158.9 lbs   ? ?LVAD interrogation reveals:  ?Speed:  9600 ?Flow: 4.3 ?Power: 5.6 w ?PI: 6.8 ?Alarms: none ?Events: none ? ?Fixed speed:  9600 ?Low speed limit: 9000 ? ?Drive Line:  Drive line is being maintained weekly by [name], his wife. Existing VAD dressing C/D/I Sorbaview dressing and Silverlon patch intact. Exit site healed and incorporated, the velour is fully implanted at exit site. Pt denies fever or chills. Weekly dressing changes per [facility] nurse or caregiver. Next dressing change due 12/15/21. ? ?Labs:  ?LDH trend: 249>282>172>200>144>245>251>265>249>264 ? ?INR trend:  1.4>1.3>1.3>1.2>1.3>1.4>1.5>1.7>2.3>2.2 ? ?Anticoagulation Plan: ?-INR Goal:  2.5 - 3.0 ?-ASA Dose: 81 mg daily ? ? ?Device: Medtronic dual ?- Therapies: on 231 bpm ?- Pacing:  AAI<=>DDD 80 bpm ? ?Gtts:  ?- Heparin - 1200 units/hr-off 4/13 ? ? ?Plan/Recommendations:  ?Call VAD Coordinator with any VAD equipment or drive line issues. ?Weekly dressing changes per [facility] nurse or caregiver.  ?Pt to d/c home today. Hosp f/u Tuesday 4/18 @  0900 ? ?Tanda Rockers RN ?VAD Coordinator  ?Office: [phone]  ?24/7 Pager: [phone]  ? ? ? ? ? ? ? ? ? ?

## 2021-12-12 NOTE — Progress Notes (Signed)
OT Cancellation Note ? ?Patient Details ?Name: Johnny Oneill ?MRN: [id number] ?DOB: 20-Nov-1940 ? ? ?Cancelled Treatment:    Reason Eval/Treat Not Completed: Other (comment) (Planning for dc to home today. Discussed with pt and he feels comfortable with ADLs and functional transfers.) ? ?Skylier Kretschmer M Eliakim Tendler ?Carrington Olazabal MSOT, OTR/L ?Acute Rehab ?Pager: [phone] ?Office: [phone] ?12/12/2021, 11:06 AM ?

## 2021-12-12 NOTE — Progress Notes (Signed)
Physical Therapy Treatment ?Patient Details ?Name: Johnny Oneill ?MRN: [id number] ?DOB: [birth year] ?Today's Date: 12/12/2021 ? ? ?History of Present Illness 81 yo admitted 4/3 with Rt knee OA for optimization prior to TKA performed 4/5. PMhx: CKD, CHF, HTN, LVAD HM II 2015, HLD, CAD, Lt TKA, ICD ? ?  ?PT Comments  ? ? Pt politely declining OOB mobility, requesting to focus session rather on lower extremity exercises in bed and on educating him on what he needs to do when going home to improve his recovery. Pt planning to d/c home potentially this morning. Performed bed-level HEP with pt needing AAROM with heel slides due to limited AROM into knee flexion on the R still. However, his ROM increased with each rep. Educated pt on proper positioning of R knee into extension when at rest, performing HEP (reviewed) and ambulating every meal to encourage activity at least 3x per day, use of CPM machine, wife asissting pt with transfers and proper guarding with gait and stairs, and use of ice after activity. Pt verbalized understanding. Will continue to follow acutely. ? ? ?  ?Recommendations for follow up therapy are one component of a multi-disciplinary discharge planning process, led by the attending physician.  Recommendations may be updated based on patient status, additional functional criteria and insurance authorization. ? ?Follow Up Recommendations ? Follow physician's recommendations for discharge plan and follow up therapies ?  ?  ?Assistance Recommended at Discharge Frequent or constant Supervision/Assistance  ?Patient can return home with the following A little help with walking and/or transfers;A little help with bathing/dressing/bathroom;Assistance with cooking/housework;Assist for transportation;Help with stairs or ramp for entrance ?  ?Equipment Recommendations ? Rolling walker (2 wheels)  ?  ?Recommendations for Other Services   ? ? ?  ?Precautions / Restrictions Precautions ?Precautions: Knee;Fall;Other  (comment) ?Precaution Comments: LVAD, watch HR and BP ?Restrictions ?Weight Bearing Restrictions: Yes ?RLE Weight Bearing: Weight bearing as tolerated  ?  ? ?Mobility ? Bed Mobility ?  ?  ?  ?  ?  ?  ?  ?General bed mobility comments: Pt politely declining OOB mobility as he plans to d/c home soon today and wanted to focus on education and exercises this session. ?  ? ?Transfers ?  ?  ?  ?  ?  ?  ?  ?  ?  ?General transfer comment: Pt politely declining OOB mobility as he plans to d/c home soon today and wanted to focus on education and exercises this session. ?  ? ?Ambulation/Gait ?  ?  ?  ?  ?  ?  ?  ?General Gait Details: Pt politely declining OOB mobility as he plans to d/c home soon today and wanted to focus on education and exercises this session. ? ? ?Stairs ?  ?  ?  ?  ?General stair comments: Pt politely declining OOB mobility as he plans to d/c home soon today and wanted to focus on education and exercises this session. ? ? ?Wheelchair Mobility ?  ? ?Modified Rankin (Stroke Patients Only) ?  ? ? ?  ?Balance   ?  ?  ?  ?  ?  ?  ?  ?  ?  ?  ?  ?  ?  ?  ?  ?  ?  ?  ?  ? ?  ?Cognition Arousal/Alertness: Awake/alert ?Behavior During Therapy: [facility] for tasks assessed/performed ?Overall Cognitive Status: Within Functional Limits for tasks assessed ?  ?  ?  ?  ?  ?  ?  ?  ?  ?  ?  ?  ?  ?  ?  ?  ?  ?  ?  ? ?  ?  Exercises Total Joint Exercises ?Ankle Circles/Pumps: AROM, Right, 10 reps, Supine ?Quad Sets: AROM, Right, 10 reps, Supine ?Short Arc Quad: AROM, Right, 10 reps, Supine ?Heel Slides: AAROM, Right, 10 reps, Supine ?Hip ABduction/ADduction: AROM, Right, 10 reps, Supine ?Straight Leg Raises: AROM, Right, 10 reps, Supine ? ?  ?General Comments General comments (skin integrity, edema, etc.): Pt politely declining OOB mobility as he plans to d/c home soon today and wanted to focus on education and exercises this session. Educated pt on proper positioning of R knee into extension when at rest, performing HEP  (reviewed) and ambulating every meal to allow at least 3x per day, use of CPM machine, wife asissting pt with transfers and proper guarding with gait and stairs, and use of ice after activity ?  ?  ? ?Pertinent Vitals/Pain Pain Assessment ?Pain Assessment: Faces ?Faces Pain Scale: Hurts even more ?Pain Location: Rt knee ?Pain Descriptors / Indicators: Aching, Guarding, Operative site guarding ?Pain Intervention(s): Limited activity within patient's tolerance, Monitored during session, Repositioned  ? ? ?Home Living   ?  ?  ?  ?  ?  ?  ?  ?  ?  ?   ?  ?Prior Function    ?  ?  ?   ? ?PT Goals (current goals can now be found in the care plan section) Acute Rehab PT Goals ?Patient Stated Goal: to go home ?PT Goal Formulation: With patient ?Time For Goal Achievement: 12/18/21 ?Potential to Achieve Goals: Good ?Progress towards PT goals: Progressing toward goals ? ?  ?Frequency ? ? ? 7X/week ? ? ? ?  ?PT Plan Current plan remains appropriate  ? ? ?Co-evaluation   ?  ?  ?  ?  ? ?  ?AM-PAC PT "6 Clicks" Mobility   ?Outcome Measure ? Help needed turning from your back to your side while in a flat bed without using bedrails?: None ?Help needed moving from lying on your back to sitting on the side of a flat bed without using bedrails?: A Little ?Help needed moving to and from a bed to a chair (including a wheelchair)?: A Little ?Help needed standing up from a chair using your arms (e.g., wheelchair or bedside chair)?: A Little ?Help needed to walk in hospital room?: A Little ?Help needed climbing 3-5 steps with a railing? : A Little ?6 Click Score: 19 ? ?  ?End of Session   ?Activity Tolerance: Patient tolerated treatment well ?Patient left: in bed;with call bell/phone within reach ?Nurse Communication: Mobility status ?PT Visit Diagnosis: Other abnormalities of gait and mobility (R26.89);Difficulty in walking, not elsewhere classified (R26.2);Muscle weakness (generalized) (M62.81) ?  ? ? ?Time: 8101-7510 ?PT Time Calculation  (min) (ACUTE ONLY): 10 min ? ?Charges:  $Therapeutic Exercise: 8-22 mins          ?          ? ?Moishe Spice, PT, DPT ?Acute Rehabilitation Services  ?Pager: [phone] ?Office: [phone] ? ? ? ?Maretta Bees Pettis ?12/12/2021, 8:25 AM ? ?

## 2021-12-12 NOTE — Progress Notes (Signed)
ANTICOAGULATION CONSULT NOTE ?Pharmacy Consult for heparin  ?Indication: LVAD ? ?Allergies  ?Allergen Reactions  ? Ace Inhibitors   ?  Hypotension and elevated creatinine  ? Amiodarone Other (See Comments)  ?  Cerebellar side effects  ? Diovan [Valsartan] Other (See Comments)  ?  Hypotension at low dose, hyperkalemia  ? Lipitor [Atorvastatin Calcium] Other (See Comments)  ?  Muscle pain  ? Jardiance [Empagliflozin] Other (See Comments)  ?  Unsure of exact side effect  ? Norvasc [Amlodipine] Other (See Comments)  ?  Put patient to sleep  ? ? ?Patient Measurements: ?Height: 5\' 7"  (170.2 cm) ?Weight: 72.1 kg (158 lb 15.2 oz) ?IBW/kg (Calculated) : 66.1 ? Heparin dosing weight: 71.1 kg ? ?Vital Signs: ?Temp: 98.1 ?F (36.7 ?C) (04/14 6767) ?Temp Source: Oral (04/14 2094) ?BP: 124/97 (04/14 7096) ?Pulse Rate: 80 (04/14 2836) ? ?Labs: ?Recent Labs  ?  12/10/21 ?0107 12/11/21 ?6294 12/12/21 ?0101  ?HGB 8.4* 7.6* 9.4*  ?HCT 25.2* 23.8* 28.2*  ?PLT 182 235 266  ?LABPROT 20.0* 25.2* 24.6*  ?INR 1.7* 2.3* 2.2*  ?HEPARINUNFRC 0.25* 0.23*  --   ?CREATININE 2.19* 2.22* 2.08*  ? ? ? ?Estimated Creatinine Clearance: 27.4 mL/min (A) (by C-G formula based on SCr of 2.08 mg/dL (H)). ? ? 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 Pharmacy consulted to dose heparin while INR < 2. Underwent R TKR on 4/5 (last dose of warfarin given 3/30).  ? ?INR slightly below goal this morning, LDH and CBC stable. Got lower warfarin dose yesterday due to rapid INR rise and need for pRBCs. ? ?PTA warfarin dose: 5 mg daily ? 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  ?Plan:  ?-Warfarin 7.5mg  PO x1 tonight then resume 5mg  daily ?-Daily INR while admitted, if discharged, needs INR check next week ? ? ? ?Arrie Senate, PharmD, BCPS, BCCP ?Clinical Pharmacist ?[phone] ?Please check AMION for all [facility] numbers ?12/12/2021 ? ? ? ? ? ?

## 2021-12-12 NOTE — Progress Notes (Signed)
Discharge instructions reviewed with patient and spouse, both verbalize understanding. Patient batteries in place and assisted with dressing. Via wheelchair to wife's car in stable condition. Pain medication given just prior to getting dressed. ?

## 2021-12-12 NOTE — Progress Notes (Addendum)
?Advanced Heart Failure VAD Team Note ? ?PCP-Cardiologist: None  ? ?Subjective:   ? ?- S/p R total knee replacement 04/05 ?- 4/7 transfused 1u RBCs  ?-4/10 Midodrine cut back to 5 mg tid ?- 4/11 Failed midodrine taper. Increased back to 10 mg tid. ?-4/13 Given 1UPRBCs   ? ?INR 2.2 ? ?Hgb 7.6 given 1UPRBCs ---> Hgb 9.4  ? LDH 144> 290>203>245>251>265>249 >264 ? ?Scr stable at 2.08   ? ?Wants to go home. Able to walk with therapy. No dizziness.  ? ?LVAD INTERROGATION:  ?HeartMate II LVAD:   ?Flow 4 liters/min, speed 9600, power 5, PI 7.2  ? ?Objective:   ? ?Vital Signs:   ?Temp:  [97.6 ?F (36.4 ?C)-98.3 ?F (36.8 ?C)] 98.1 ?F (36.7 ?C) (04/14 4166) ?Pulse Rate:  [69-80] 80 (04/14 0630) ?Resp:  [14-20] 17 (04/14 1601) ?BP: (109-124)/(94-97) 124/97 (04/14 0932) ?SpO2:  [96 %-99 %] 98 % (04/14 3557) ?Weight:  [72.1 kg] 72.1 kg (04/14 3220) ?Last BM Date : 12/10/21 ?Mean arterial Pressure 90-100s  ? ?1Intake/Output:  ? ?Intake/Output Summary (Last 24 hours) at 12/12/2021 0714 ?Last data filed at 12/12/2021 0620 ?Gross per 24 hour  ?Intake 609.67 ml  ?Output 1000 ml  ?Net -390.33 ml  ?  ? ?Physical Exam  ? ? ?Physical Exam: ?GENERAL: No acute distress. ?HEENT: normal  ?NECK: Supple, JVP  flat.  2+ bilaterally, no bruits.  No lymphadenopathy or thyromegaly appreciated.   ?CARDIAC:  Mechanical heart sounds with LVAD hum present.  ?LUNGS:  Clear to auscultation bilaterally.  ?ABDOMEN:  Soft, round, nontender, positive bowel sounds x4.     ?LVAD exit site: well-healed and incorporated.  Dressing dry and intact.  No erythema or drainage.  Stabilization device present and accurately applied.  Driveline dressing is being changed daily per sterile technique. ?EXTREMITIES:  Warm and dry, no cyanosis, clubbing, rash . RLE dressing Trace  RLE edema.  ?NEUROLOGIC:  Alert and oriented x 3.    No aphasia.  No dysarthria.  Affect pleasant.    ? ? ? ?Telemetry  ?AV paced 70-80s with occasional PVCs  ? ?Labs  ? ?Basic Metabolic  Panel: ?Recent Labs  ?Lab 12/08/21 ?0105 12/09/21 ?0037 12/10/21 ?0107 12/11/21 ?2542 12/12/21 ?0101  ?NA 135 137 135 134* 134*  ?K 4.2 4.5 4.3 4.5 4.3  ?CL 104 105 104 103 104  ?CO2 23 26 24 25 25   ?GLUCOSE 125* 109* 111* 117* 110*  ?BUN 41* 40* 37* 34* 32*  ?CREATININE 2.30* 2.19* 2.19* 2.22* 2.08*  ?CALCIUM 8.6* 8.8* 8.8* 8.7* 8.7*  ?MG 2.0 2.0 2.0 1.9 2.0  ? ? ?Liver Function Tests: ?No results for input(s): AST, ALT, ALKPHOS, BILITOT, PROT, ALBUMIN in the last 168 hours. ?No results for input(s): LIPASE, AMYLASE in the last 168 hours. ?No results for input(s): AMMONIA in the last 168 hours. ? ?CBC: ?Recent Labs  ?Lab 12/08/21 ?1409 12/09/21 ?0037 12/10/21 ?0107 12/11/21 ?7062 12/12/21 ?0101  ?WBC 6.5 5.6 6.6 7.3 6.9  ?HGB 8.2* 8.0* 8.4* 7.6* 9.4*  ?HCT 24.3* 24.2* 25.2* 23.8* 28.2*  ?MCV 93.1 93.1 93.3 93.7 91.3  ?PLT 172 171 182 235 266  ? ? ?INR: ?Recent Labs  ?Lab 12/08/21 ?0105 12/09/21 ?0037 12/10/21 ?0107 12/11/21 ?3762 12/12/21 ?0101  ?INR 1.4* 1.5* 1.7* 2.3* 2.2*  ? ? ?Other results: ? ? ? ?Imaging  ? ?No results found. ? ? ?Medications:   ? ? ?Scheduled Medications: ? aspirin EC  81 mg Oral q AM  ? citalopram  10  mg Oral Daily  ? docusate sodium  100 mg Oral BID  ? levothyroxine  175 mcg Oral Q0600  ? mexiletine  150 mg Oral BID  ? midodrine  10 mg Oral TID WC  ? pantoprazole  40 mg Oral Daily  ? rosuvastatin  20 mg Oral QPM  ? sodium chloride flush  3 mL Intravenous Q12H  ? sotalol  80 mg Oral Daily  ? warfarin  7.5 mg Oral ONCE-1600  ? Warfarin - Pharmacist Dosing Inpatient   Does not apply C3762  ? ? ?Infusions: ? sodium chloride    ? ? ?PRN Medications: ?sodium chloride, acetaminophen, HYDROmorphone (DILAUDID) injection, magnesium hydroxide, melatonin, menthol-cetylpyridinium **OR** phenol, metoCLOPramide **OR** metoCLOPramide (REGLAN) injection, ondansetron (ZOFRAN) IV, oxyCODONE, polyethylene glycol, senna, sorbitol, traMADol, zolpidem ? ? ?Patient Profile  ?Johnny Oneill is a 65 year with a h/o CAD,  chronic HFrEF with HMII LVAD, VT, A flutter, hypothyroidism, HTN, CKD Stage IV, and OA. 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  ? ?Scheduled admit for TKR ? ? ?Assessment/Plan:   ? 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  ?-Orthostasis resolving. Switched to ultram with oxy ir for break through pain.  ?-  compression hose ?- Progressing with PT.  ?-Has walker for d/c . HHPT set up and outpatient PT.  ? ?2) Orthostatic hypotension ?- suspect vasoplegia ?- volume repleted ?- Failed midodrine wean.  ?-Continue midodrine 10 mg tid.   ?- compression hose ?  ?3) Chronic systolic HF: ICM, EF 83-15% s/p LVAD HM II implant 08/2013 for DT.   ?- Echo 3/18 EF 10-15% mild AI. Mod RV dysfunction. LVAD cannula OK ?- has struggled with RV failure however RHC 11/22 looked good (despite evidence of RVF on LE dopplers showing reflux ?- Volume status ok.  ?- Remains off all diuretics including spiro ?- Failed Jardiance due to volume depletion ?- He has not tolerated ACE-I or amlodipine or b-blocker in the past. ?  ?4) LVAD: HMII 08/2013 for DT. ?-  Admitted in May 2018 with elevated LDH with bival.  ?- Continue aspirin. ?- Off coumadin for TKR. Restarted post-op. ?- INR 2.2 .  Continue warfarin  ?- LDH stable 251>265 >249 ->264 ?- Management as above ?  ?5) VT/Frequent PVCs ?- now s/p ICD gen change.  ?- Off amio due to cerebellar side effects.  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 Now on sotalol+ mexiletine.  ?- 06/16/21 recurrent VT. DC-CV x 1 ?- 5 minute run WCT 04/05 ? VT/AFL. Self terminated. ?- Keep K > 4 and Mag > 2. Stable.  ?  ?6) Paroxysmal Atrial flutter ?- patient paced out of AFL in 6/22. Atrial therapies enabled  ?- continue sotalol (on for VT). QTs slightly prolonged but stable ?- may need to consider ablation if recurs ?- AV paced  ?  ?7) Chronic anticoagulation: INR goal 2.0-3.0.    ?- INR 2.2 Plan as above. ?- Discussed coumadin dose with  pharmacy.  ?  ?8) Hypothyroidism:  ?- Followed by Dr Dwyane Dee.  ?- Continue levothyroxine.  ?  ?9) Hyperlipidemia:  ?- Continue Crestor. Zetia stopped due to cost.  ?  ?10) CAD ?- No s/s angina ?- continue Crestor ?  ?11) CKD IV ?- Creatinine baseline 2.3 - 2.7 ?- Creatinine 2.1 ?- Follow BMET daily   ? ?12) Anemia, acute blood loss ?- Baseline Hgb 10-11 ?- 1 u RBCs on 04/07 ?- Hgb  8.4>7.6 given 1 UPRBCs with appropriate rise.  Hgb 9.4  ? ?Length of Stay: 11 ? ?Johnny Grinder, NP ?12/12/2021, 7:14 AM ? ?VAD Team --- VAD ISSUES ONLY--- ?Pager [phone] (7am - 7am) ? ?Advanced Heart Failure Team  ?Pager [phone] (M-F; 7a - 5p)  ?Please contact [facility] for night-coverage after hours (5p -7a ) and weekends on [email or web address] ? ?Patient seen and examined with the above-signed Advanced Practice Provider and/or Housestaff. I personally reviewed laboratory data, imaging studies and relevant notes. I independently examined the patient and formulated the important aspects of the plan. I have edited the note to reflect any of my changes or salient points. I have personally discussed the plan with the patient and/or family. ? ?No further orthostasis. SCR stable. INR stable at 2.2. Working with rehab.  ? ?General:  NAD.  ?HEENT: normal  ?Neck: supple. JVP not elevated.  Carotids 2+ bilat; no bruits. No lymphadenopathy or thryomegaly appreciated. ?Cor: LVAD hum.  ?Lungs: Clear. ?Abdomen: soft, nontender, non-distended. No hepatosplenomegaly. No bruits or masses. Good bowel sounds. Driveline site clean. Anchor in place.  ?Extremities: no cyanosis, clubbing, rash. Warm no edema  ?Neuro: alert & oriented x 3. No focal deficits. Moves all 4 without problem  ? ?[place] for d/c home today. VAD interrogated personally. Parameters stable. Follow up in [facility].  ? ?Glori Bickers, MD  ?3:30 PM ? ? ?

## 2021-12-12 NOTE — TOC CM/SW Note (Signed)
HF TOC CM will notify [facility] of scheduled dc home today with [facility]. [place] orders in Epic. Jonnie Finner RN3 CCM, Heart Failure TOC CM [phone]  ?

## 2021-12-13 DIAGNOSIS — I5022 Chronic systolic (congestive) heart failure: Secondary | ICD-10-CM | POA: Diagnosis not present

## 2021-12-13 DIAGNOSIS — M158 Other polyosteoarthritis: Secondary | ICD-10-CM | POA: Diagnosis not present

## 2021-12-13 DIAGNOSIS — E039 Hypothyroidism, unspecified: Secondary | ICD-10-CM | POA: Diagnosis not present

## 2021-12-13 DIAGNOSIS — I4892 Unspecified atrial flutter: Secondary | ICD-10-CM | POA: Diagnosis not present

## 2021-12-13 DIAGNOSIS — I255 Ischemic cardiomyopathy: Secondary | ICD-10-CM | POA: Diagnosis not present

## 2021-12-13 DIAGNOSIS — I130 Hypertensive heart and chronic kidney disease with heart failure and stage 1 through stage 4 chronic kidney disease, or unspecified chronic kidney disease: Secondary | ICD-10-CM | POA: Diagnosis not present

## 2021-12-13 DIAGNOSIS — Z7982 Long term (current) use of aspirin: Secondary | ICD-10-CM | POA: Diagnosis not present

## 2021-12-13 DIAGNOSIS — F419 Anxiety disorder, unspecified: Secondary | ICD-10-CM | POA: Diagnosis not present

## 2021-12-13 DIAGNOSIS — I493 Ventricular premature depolarization: Secondary | ICD-10-CM | POA: Diagnosis not present

## 2021-12-13 DIAGNOSIS — L259 Unspecified contact dermatitis, unspecified cause: Secondary | ICD-10-CM | POA: Diagnosis not present

## 2021-12-13 DIAGNOSIS — E7800 Pure hypercholesterolemia, unspecified: Secondary | ICD-10-CM | POA: Diagnosis not present

## 2021-12-13 DIAGNOSIS — Z7901 Long term (current) use of anticoagulants: Secondary | ICD-10-CM | POA: Diagnosis not present

## 2021-12-13 DIAGNOSIS — D631 Anemia in chronic kidney disease: Secondary | ICD-10-CM | POA: Diagnosis not present

## 2021-12-13 DIAGNOSIS — K760 Fatty (change of) liver, not elsewhere classified: Secondary | ICD-10-CM | POA: Diagnosis not present

## 2021-12-13 DIAGNOSIS — I2510 Atherosclerotic heart disease of native coronary artery without angina pectoris: Secondary | ICD-10-CM | POA: Diagnosis not present

## 2021-12-13 DIAGNOSIS — Z9181 History of falling: Secondary | ICD-10-CM | POA: Diagnosis not present

## 2021-12-13 DIAGNOSIS — Z96653 Presence of artificial knee joint, bilateral: Secondary | ICD-10-CM | POA: Diagnosis not present

## 2021-12-13 DIAGNOSIS — N184 Chronic kidney disease, stage 4 (severe): Secondary | ICD-10-CM | POA: Diagnosis not present

## 2021-12-13 DIAGNOSIS — Z95810 Presence of automatic (implantable) cardiac defibrillator: Secondary | ICD-10-CM | POA: Diagnosis not present

## 2021-12-13 DIAGNOSIS — I472 Ventricular tachycardia, unspecified: Secondary | ICD-10-CM | POA: Diagnosis not present

## 2021-12-13 DIAGNOSIS — I252 Old myocardial infarction: Secondary | ICD-10-CM | POA: Diagnosis not present

## 2021-12-13 DIAGNOSIS — I4510 Unspecified right bundle-branch block: Secondary | ICD-10-CM | POA: Diagnosis not present

## 2021-12-13 DIAGNOSIS — Z471 Aftercare following joint replacement surgery: Secondary | ICD-10-CM | POA: Diagnosis not present

## 2021-12-15 ENCOUNTER — Other Ambulatory Visit (HOSPITAL_COMMUNITY): Payer: Self-pay | Admitting: *Deleted

## 2021-12-15 DIAGNOSIS — Z96653 Presence of artificial knee joint, bilateral: Secondary | ICD-10-CM | POA: Diagnosis not present

## 2021-12-15 DIAGNOSIS — Z95811 Presence of heart assist device: Secondary | ICD-10-CM

## 2021-12-15 DIAGNOSIS — Z7901 Long term (current) use of anticoagulants: Secondary | ICD-10-CM

## 2021-12-15 DIAGNOSIS — Z471 Aftercare following joint replacement surgery: Secondary | ICD-10-CM | POA: Diagnosis not present

## 2021-12-15 DIAGNOSIS — I130 Hypertensive heart and chronic kidney disease with heart failure and stage 1 through stage 4 chronic kidney disease, or unspecified chronic kidney disease: Secondary | ICD-10-CM | POA: Diagnosis not present

## 2021-12-15 DIAGNOSIS — F419 Anxiety disorder, unspecified: Secondary | ICD-10-CM | POA: Diagnosis not present

## 2021-12-15 DIAGNOSIS — I5022 Chronic systolic (congestive) heart failure: Secondary | ICD-10-CM | POA: Diagnosis not present

## 2021-12-15 DIAGNOSIS — I2510 Atherosclerotic heart disease of native coronary artery without angina pectoris: Secondary | ICD-10-CM | POA: Diagnosis not present

## 2021-12-16 ENCOUNTER — Encounter (HOSPITAL_COMMUNITY): Payer: Self-pay

## 2021-12-16 ENCOUNTER — Ambulatory Visit (HOSPITAL_COMMUNITY): Payer: Self-pay | Admitting: Pharmacist

## 2021-12-16 ENCOUNTER — Ambulatory Visit (HOSPITAL_COMMUNITY)
Admit: 2021-12-16 | Discharge: 2021-12-16 | Disposition: A | Discharge status: Home / Self Care | Payer: Medicare Other | Attending: Cardiology | Admitting: Cardiology

## 2021-12-16 VITALS — BP 113/88 | HR 81 | Wt 160.4 lb

## 2021-12-16 DIAGNOSIS — E785 Hyperlipidemia, unspecified: Secondary | ICD-10-CM | POA: Insufficient documentation

## 2021-12-16 DIAGNOSIS — Z79899 Other long term (current) drug therapy: Secondary | ICD-10-CM | POA: Diagnosis not present

## 2021-12-16 DIAGNOSIS — I130 Hypertensive heart and chronic kidney disease with heart failure and stage 1 through stage 4 chronic kidney disease, or unspecified chronic kidney disease: Secondary | ICD-10-CM | POA: Diagnosis not present

## 2021-12-16 DIAGNOSIS — I2510 Atherosclerotic heart disease of native coronary artery without angina pectoris: Secondary | ICD-10-CM | POA: Diagnosis not present

## 2021-12-16 DIAGNOSIS — Z95811 Presence of heart assist device: Secondary | ICD-10-CM | POA: Diagnosis not present

## 2021-12-16 DIAGNOSIS — I4892 Unspecified atrial flutter: Secondary | ICD-10-CM | POA: Diagnosis not present

## 2021-12-16 DIAGNOSIS — N184 Chronic kidney disease, stage 4 (severe): Secondary | ICD-10-CM | POA: Insufficient documentation

## 2021-12-16 DIAGNOSIS — Z7982 Long term (current) use of aspirin: Secondary | ICD-10-CM | POA: Insufficient documentation

## 2021-12-16 DIAGNOSIS — Z7901 Long term (current) use of anticoagulants: Secondary | ICD-10-CM | POA: Diagnosis not present

## 2021-12-16 DIAGNOSIS — I493 Ventricular premature depolarization: Secondary | ICD-10-CM | POA: Insufficient documentation

## 2021-12-16 DIAGNOSIS — I5082 Biventricular heart failure: Secondary | ICD-10-CM | POA: Insufficient documentation

## 2021-12-16 DIAGNOSIS — M1990 Unspecified osteoarthritis, unspecified site: Secondary | ICD-10-CM | POA: Insufficient documentation

## 2021-12-16 DIAGNOSIS — I5022 Chronic systolic (congestive) heart failure: Secondary | ICD-10-CM | POA: Diagnosis not present

## 2021-12-16 DIAGNOSIS — Z95810 Presence of automatic (implantable) cardiac defibrillator: Secondary | ICD-10-CM | POA: Insufficient documentation

## 2021-12-16 DIAGNOSIS — I472 Ventricular tachycardia, unspecified: Secondary | ICD-10-CM | POA: Diagnosis not present

## 2021-12-16 DIAGNOSIS — E039 Hypothyroidism, unspecified: Secondary | ICD-10-CM | POA: Insufficient documentation

## 2021-12-16 DIAGNOSIS — I951 Orthostatic hypotension: Secondary | ICD-10-CM | POA: Insufficient documentation

## 2021-12-16 LAB — BASIC METABOLIC PANEL
Anion gap: 7 (ref 5–15)
BUN: 36 mg/dL — ABNORMAL HIGH (ref 8–23)
CO2: 25 mmol/L (ref 22–32)
Calcium: 9.1 mg/dL (ref 8.9–10.3)
Chloride: 103 mmol/L (ref 98–111)
Creatinine, Ser: 2.39 mg/dL — ABNORMAL HIGH (ref 0.61–1.24)
GFR, Estimated: 27 mL/min — ABNORMAL LOW (ref >60)
Glucose, Bld: 111 mg/dL — ABNORMAL HIGH (ref 70–99)
Potassium: 3.9 mmol/L (ref 3.5–5.1)
Sodium: 135 mmol/L (ref 135–145)

## 2021-12-16 LAB — CBC
HCT: 32.2 % — ABNORMAL LOW (ref 39.0–52.0)
Hemoglobin: 10.4 g/dL — ABNORMAL LOW (ref 13.0–17.0)
MCH: 30.3 pg (ref 26.0–34.0)
MCHC: 32.3 g/dL (ref 30.0–36.0)
MCV: 93.9 fL (ref 80.0–100.0)
Platelets: 399 10*3/uL (ref 150–400)
RBC: 3.43 MIL/uL — ABNORMAL LOW (ref 4.22–5.81)
RDW: 14.6 % (ref 11.5–15.5)
WBC: 10.6 10*3/uL — ABNORMAL HIGH (ref 4.0–10.5)
nRBC: 0 % (ref 0.0–0.2)

## 2021-12-16 LAB — PROTIME-INR
INR: 2.7 — ABNORMAL HIGH (ref 0.8–1.2)
Prothrombin Time: 28.5 seconds — ABNORMAL HIGH (ref 11.4–15.2)

## 2021-12-16 LAB — LACTATE DEHYDROGENASE: LDH: 319 U/L — ABNORMAL HIGH (ref 98–192)

## 2021-12-16 NOTE — Progress Notes (Signed)
? ?VAD CLINIC NOTE ? ? ?HPI:  ?Johnny Oneill is a 81 y.o. 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  ? ?Presented to ER on 06/27/19 with syncopal episode and head injury from fall. ECG showed VT @ 240. Underwent emergent DC-CV in ER. Head CT no bleed. Admitted and had ICD gen replacement (EOL). ? ?Did not tolerate amio due to tremors. ? ?Had ICD shock for VT on 06/09/20. Started on sotalol ? ?On 02/06/21 Paced out of AFL. ? ?Seen in ER 05/2021 due to LOW FLOW alarms. Found to be in VT rate 170. DCCV x 1 - returned to [facility] with frequent PVCs. Pacer settings adjusted by [name] PA- DDD 90. Post cardioversion persistent LOW FLOWS noted. Received 2 L IVF. Low flows resolved, and he was discharged home.  ? ?[place] 11/22 ?RA = 5 ?RV = 33/5 ?PA = 33/13 (20) ?PCW = 10 ?Fick cardiac output/index = 4.0/2.1 ?Thermo CO/CI = 4.9/2.6 ?PVR = 2.0 WU ?Ao sat = 95% ?PA sat = 54%, 59% ?PAPI = 4.0 ? ?Admitted earlier this month for R TKA. Did well with surgery but complicated by severe orthostatic hypotension. Now on midodrine 10 tid. BP improved. Only one episode of dizziness since d/c. Still having some pain in knee but improving with rehab. Has only take one oxycodone. Using mostly tramadol for pain. Denies orthopnea or PND. No fevers, chills or problems with driveline. No bleeding, melena or neuro symptoms. No VAD alarms. Taking all meds as prescribed. ?  ?  ?VAD Indication: ?Destination therapy- patient choice   ?  ?VAD interrogation & Equipment Management: ?Speed: 0254 ?Flow: 4.2 ?Power: 5.5 w    ?PI: 7.2 ?  ?Alarms: none ?Events: 10 - 20 PI events daily ? ?Fixed speed 9600  ?Low speed limit: 9000 ?  ?Primary Controller:  Replace back up battery in 30 months ?Back up controller:  Replace back up battery in 26 months ?  ?Annual Equipment Maintenance on UBC/PM was performed today 09/23/21 ?  ?I reviewed the LVAD parameters from today and  compared the results to the patient's prior recorded data. LVAD interrogation was NEGATIVE for significant power changes, NEGATIVE for clinical alarms and STABLE for PI events/speed drops. No programming changes were made and pump is functioning within specified parameters. Pt is performing daily controller and system monitor self tests along with completing weekly and monthly maintenance for LVAD equipment. ?  ? ?Past Medical History:  ?Diagnosis Date  ? Anxiety   ? Automatic implantable cardioverter-defibrillator in situ   ? CAD (coronary artery disease)   ? S/P stenting of LCX in 2004;  s/p Lat MI 2009 with occlusion of the LCX - treated with Promus stenting. Has total occlusion of the RCA.   ? CHF (congestive heart failure) ([place])   ? Hypercholesteremia   ? Hypertension   ? Hypothyroidism   ? ICD (implantable cardiac defibrillator) in place   ? PROPHYLACTIC      medtronic  ? Inferior MI ([place]) "? date"; 2009  ? Ischemic cardiomyopathy   ? a. 10/09 Echo: Sev LV Dysfxn, inf/lat AK, Mod MR.  ? Liver disease   ? LVAD (left ventricular assist device) present ([place])   ? Osteoarthritis   ? a. s/p R TKR  ? Pacemaker   ? PVC (premature ventricular contraction)   ? RBBB   ? Shortness of breath   ? ? ?Current Outpatient  Medications  ?Medication Sig Dispense Refill  ? acetaminophen (TYLENOL) 500 MG tablet Take 500 mg by mouth every 6 (six) hours as needed (pain).    ? aspirin EC 81 MG tablet Take 81 mg by mouth in the morning.    ? cholecalciferol (VITAMIN D3) 25 MCG (1000 UT) tablet Take 1,000 Units by mouth in the morning.    ? citalopram (CELEXA) 10 MG tablet Take 1 tablet by mouth once daily 90 tablet 3  ? Coenzyme Q10 (COQ10) 200 MG CAPS Take 200 mg by mouth in the morning.    ? famotidine (PEPCID) 20 MG tablet Take 20 mg by mouth in the morning.    ? melatonin 5 MG TABS Take 5 mg by mouth at bedtime as needed (sleep).    ? mexiletine (MEXITIL) 150 MG capsule Take 1 capsule (150 mg total) by mouth 2 (two) times daily. 60  capsule 6  ? midodrine (PROAMATINE) 10 MG tablet Take 1 tablet (10 mg total) by mouth 3 (three) times daily with meals. 90 tablet 3  ? oxyCODONE (OXY IR/ROXICODONE) 5 MG immediate release tablet Take 1 tablet (5 mg total) by mouth every 12 (twelve) hours as needed for breakthrough pain. 30 tablet 0  ? pantoprazole (PROTONIX) 40 MG tablet Take 1 tablet by mouth once daily 90 tablet 3  ? Probiotic Product (PROBIOTIC PO) Take 1 capsule by mouth in the morning.    ? rosuvastatin (CRESTOR) 20 MG tablet Take 1 tablet (20 mg total) by mouth daily. 90 tablet 3  ? sotalol (BETAPACE) 80 MG tablet TAKE 1 TABLET (80 MG TOTAL) BY MOUTH DAILY. 90 tablet 3  ? SYNTHROID 175 MCG tablet 1 tablet daily every morning except half tablet on Sundays (Patient taking differently: 1 tablet daily every morning) 30 tablet 3  ? traMADol (ULTRAM) 50 MG tablet Take 1-2 tablets (50-100 mg total) by mouth every 6 (six) hours as needed for moderate pain. 40 tablet 5  ? traZODone (DESYREL) 50 MG tablet Take 1 tablet (50 mg total) by mouth at bedtime. (Patient taking differently: Take 50 mg by mouth at bedtime as needed for sleep.) 30 tablet 5  ? warfarin (COUMADIN) 5 MG tablet Take 7.5 mg 4/14 then 5 mg (1 tablet) every day as directed by HF Clinic 135 tablet 11  ? furosemide (LASIX) 20 MG tablet Take 1 tablet (20 mg total) by mouth daily as needed (Take 20 mg as daily as needed for wt > 153 lbs). Reported on 01/14/2016 (Patient not taking: Reported on 11/26/2021) 30 tablet 6  ? neomycin-bacitracin-polymyxin (NEOSPORIN) ointment Apply 1 application. topically as needed for wound care. (Patient not taking: Reported on 11/26/2021)    ? potassium chloride SA (KLOR-CON) 20 MEQ tablet Take 1 tablet (20 mEq total) by mouth daily as needed. Take one potassium pill when you take your lasix. (Patient not taking: Reported on 11/26/2021) 30 tablet 6  ? ?No current facility-administered medications for this encounter.  ? ?Facility-Administered Medications Ordered in  Other Encounters  ?Medication Dose Route Frequency Provider Last Rate Last Admin  ? etomidate (AMIDATE) injection    Anesthesia Intra-op Myna Bright, CRNA   20 mg at 02/08/14 0915  ? rocuronium [facility]) injection    Anesthesia Intra-op Myna Bright, CRNA   50 mg at 02/08/14 0932  ? succinylcholine (ANECTINE) injection    Anesthesia Intra-op Myna Bright, CRNA   100 mg at 02/08/14 0915  ? ?Ace inhibitors, Amiodarone, Diovan [valsartan], Lipitor [atorvastatin calcium], Jardiance [  empagliflozin], and Norvasc [amlodipine] ? ?Review of systems complete and found to be negative unless listed in HPI.   ? ?  ?Vital Signs:  ?Doppler Pressure: 84 ?Vitals:  ? 12/16/21 0935 12/16/21 0938  ?BP: (!) 118/0 113/88  ?Pulse: 81   ? ? ?  ?Vital Signs:   ?Temp:   ?Doppler Pressure: 118 ?Automatc BP: 113/88 (99) ?HR: 81 ?SPO2: UTO% on RA ?  ?Weight: 160.4 lb w/ eqt ?Last weight: 158.9  lb ? ?Physical exam: ?General:  NAD.  ?HEENT: normal  ?Neck: supple. JVP not elevated.  Carotids 2+ bilat; no bruits. No lymphadenopathy or thryomegaly appreciated. ?Cor: LVAD hum.  ?Lungs: Clear. ?Abdomen:  soft, nontender, non-distended. No hepatosplenomegaly. No bruits or masses. Good bowel sounds. Driveline site clean. Anchor in place.  ?Extremities: no cyanosis, clubbing, rash. Warm no edema  Mild R knee swelling. Surgical wound well healed ?Neuro: alert & oriented x 3. No focal deficits. Moves all 4 without problem  ? ? ?ASSESSMENT AND PLAN:  ? ?1) Chronic systolic HF: ICM, EF 86-57% s/p LVAD HM II implant 08/2013 for DT.   ?- Echo 3/18 EF 10-15% mild AI. Mod RV dysfunction. LVAD cannula OK ?- has struggled with RV failure however RHC 11/22 looked good (despite evidence of RVF on LE dopplers showing reflux ?- Stable NYHA II-III ?- Volume status ok.  ?- Remains off all diuretics including spiro ?- Failed Jardiance due to volume depletion ?- He has not tolerated ACE-I or amlodipine or b-blocker in the past. ? ?2) LVAD: HMII 08/2013  for DT. ?- VAD interrogated personally. Parameters stable. ?- He has a fracture on white cable. We changed controlled today in clinic ?- Admitted in May 2018 with elevated LDH with bival.  ?- Continue aspir

## 2021-12-16 NOTE — Progress Notes (Signed)
Patient presents for hospital discharge follow up in [facility] today with [name]. Reports no problems with VAD equipment or concerns with drive line.  ? ?Pt arrived via wheelchair today. Recently hospitalized 12/01/21 - 12/12/21 for right total knee replacement with Dr French Ana. Patient reports he has been doing ok since hospital discharge. Reports pain is managed mostly with PRN Tramadol. He is only using PRN Oxycodone when pain is severe. Reports no difficulties ambulating in his house with walker. He is using his CPM machine BID, and is working with HHPT  through [facility] three times per week. He had his first PT session yesterday, and reports it "about killed me" working out his knee. He has follow up with Dr French Ana scheduled 12/18/21.  ? ?Denies palpitations, syncope, falls, heart failure symptoms, and signs of bleeding. Reports one day of dizziness/lightheadness with mobility at home, but otherwise has felt ok. Today in clinic he was lightheaded and dizzy when standing for weight, and when standing to move to stretcher. Dizziness resolved with rest. Reports he has not had much to drink today. Provided with glass of water. Pt taking Midodrine 10 mg TID. BP stable today. Encouraged increased PO intake. Pt verbalized understanding. No medication changes today per Dr Haroldine Laws.  ? ?Pt's white power cord locking mechanism is cracked on primary controller. Discussed controller change out with Dr Haroldine Laws and pt. Decision was made to replace pt's primary controller. Controller replaced with [id number] Exp: 05/19/23. Backup battery: [id number] Man: 06/17/21 Exp: 05/19/23. Exchange process reviewed with pt and his wife. New primary controller programmed with pt current speed settings: Fixed: 9600 Low: 9000 prior to exchange. Exchange completed with no complications. ? ? ? ? ?Vital Signs:   ?Temp:   ?Doppler Pressure: 118 ?Automatc BP: 113/88 (99) ?HR: 81 ?SPO2: UTO% on RA ?  ?Weight: 160.4 lb w/ eqt ?Last weight:  158.9  lb ? ?VAD Indication: ?Destination therapy- patient choice   ?  ?VAD interrogation & Equipment Management: ?Speed: 2500 ?Flow: 4.2 ?Power: 5.5 w    ?PI: 7.2 ?  ?Alarms: none ?Events: 10 - 20 PI events daily ? ?Fixed speed 9600  ?Low speed limit: 9000 ?  ?Primary Controller:  Replace back up battery in 30 months ?Back up controller:  Replace back up battery in 26 months ?  ?Annual Equipment Maintenance on UBC/PM was performed today 09/23/21 ?  ?I reviewed the LVAD parameters from today and compared the results to the patient's prior recorded data. LVAD interrogation was NEGATIVE for significant power changes, NEGATIVE for clinical alarms and STABLE for PI events/speed drops. No programming changes were made and pump is functioning within specified parameters. Pt is performing daily controller and system monitor self tests along with completing weekly and monthly maintenance for LVAD equipment. ? ?Exit Site Care: ?Drive line is being maintained weekly by [name], his wife. Existing VAD dressing C/D/I Sorbaview dressing and Silverlon patch intact. Exit site healed and incorporated, the velour is fully implanted at exit site. Pt denies fever or chills. Pt provided with 8 weekly kits for home use.  ? ?Significant Events on VAD Support:  ?10/2013>SBO ?12/2016> elevated LDH, admit for Bival ?  ?Device: Dual Medtronic ?Therapies:  on 231  ?Pacing: AAI<=>DDD 80 bpm  ?Last check:  06/26/21 ? ?BP & Labs:  ?Doppler 118 - Doppler is reflecting modified systolic ? ?Hgb 10.4 - No S/S of bleeding. Specifically denies melena/BRBPR or nosebleeds. ?  ?LDH 319 - established baseline of 200- 350. Denies tea-colored urine. No  power elevations noted on interrogation.  ? ?Patient Instructions:  ?No medication changes ?Coumadin dosing per Ander Purpura PharmD ?Return to [facility] in 3 weeks for follow up with Dr Haroldine Laws ? ?Emerson Monte RN ?VAD Coordinator  ?Office: [phone]  ?24/7 Pager: [phone]  ? ? ? ? ? ? ? ?

## 2021-12-16 NOTE — Progress Notes (Signed)
LVAD INR 

## 2021-12-16 NOTE — Patient Instructions (Addendum)
No medication changes ?Coumadin dosing per Ander Purpura PharmD ?Return to [facility] in 3 weeks for follow up with Dr Haroldine Laws ?

## 2021-12-18 ENCOUNTER — Inpatient Hospital Stay (HOSPITAL_COMMUNITY): Payer: Medicare Other

## 2021-12-18 ENCOUNTER — Encounter (HOSPITAL_COMMUNITY): Payer: Medicare Other

## 2021-12-18 ENCOUNTER — Other Ambulatory Visit: Payer: Self-pay

## 2021-12-18 ENCOUNTER — Encounter (HOSPITAL_COMMUNITY): Payer: Self-pay | Admitting: Emergency Medicine

## 2021-12-18 ENCOUNTER — Observation Stay (HOSPITAL_COMMUNITY)
Admission: EM | Admit: 2021-12-18 | Discharge: 2021-12-19 | Disposition: A | Discharge status: Home Health | Payer: Medicare Other | Attending: Internal Medicine | Admitting: Internal Medicine

## 2021-12-18 DIAGNOSIS — Z7901 Long term (current) use of anticoagulants: Secondary | ICD-10-CM | POA: Diagnosis not present

## 2021-12-18 DIAGNOSIS — I252 Old myocardial infarction: Secondary | ICD-10-CM | POA: Insufficient documentation

## 2021-12-18 DIAGNOSIS — I5022 Chronic systolic (congestive) heart failure: Secondary | ICD-10-CM | POA: Diagnosis not present

## 2021-12-18 DIAGNOSIS — R55 Syncope and collapse: Secondary | ICD-10-CM | POA: Diagnosis not present

## 2021-12-18 DIAGNOSIS — I951 Orthostatic hypotension: Secondary | ICD-10-CM | POA: Diagnosis not present

## 2021-12-18 DIAGNOSIS — I2510 Atherosclerotic heart disease of native coronary artery without angina pectoris: Secondary | ICD-10-CM | POA: Insufficient documentation

## 2021-12-18 DIAGNOSIS — Z950 Presence of cardiac pacemaker: Secondary | ICD-10-CM | POA: Diagnosis not present

## 2021-12-18 DIAGNOSIS — Z79899 Other long term (current) drug therapy: Secondary | ICD-10-CM | POA: Insufficient documentation

## 2021-12-18 DIAGNOSIS — Z7982 Long term (current) use of aspirin: Secondary | ICD-10-CM | POA: Diagnosis not present

## 2021-12-18 DIAGNOSIS — Z96653 Presence of artificial knee joint, bilateral: Secondary | ICD-10-CM | POA: Diagnosis not present

## 2021-12-18 DIAGNOSIS — S0990XA Unspecified injury of head, initial encounter: Secondary | ICD-10-CM | POA: Diagnosis not present

## 2021-12-18 DIAGNOSIS — I130 Hypertensive heart and chronic kidney disease with heart failure and stage 1 through stage 4 chronic kidney disease, or unspecified chronic kidney disease: Secondary | ICD-10-CM | POA: Insufficient documentation

## 2021-12-18 DIAGNOSIS — R42 Dizziness and giddiness: Secondary | ICD-10-CM | POA: Diagnosis present

## 2021-12-18 DIAGNOSIS — G319 Degenerative disease of nervous system, unspecified: Secondary | ICD-10-CM | POA: Diagnosis not present

## 2021-12-18 DIAGNOSIS — H6123 Impacted cerumen, bilateral: Secondary | ICD-10-CM | POA: Diagnosis not present

## 2021-12-18 DIAGNOSIS — N184 Chronic kidney disease, stage 4 (severe): Secondary | ICD-10-CM | POA: Insufficient documentation

## 2021-12-18 DIAGNOSIS — M1711 Unilateral primary osteoarthritis, right knee: Secondary | ICD-10-CM | POA: Diagnosis not present

## 2021-12-18 DIAGNOSIS — I4892 Unspecified atrial flutter: Secondary | ICD-10-CM | POA: Insufficient documentation

## 2021-12-18 DIAGNOSIS — E039 Hypothyroidism, unspecified: Secondary | ICD-10-CM | POA: Insufficient documentation

## 2021-12-18 LAB — CBC WITH DIFFERENTIAL/PLATELET
Abs Immature Granulocytes: 0.08 10*3/uL — ABNORMAL HIGH (ref 0.00–0.07)
Basophils Absolute: 0 10*3/uL (ref 0.0–0.1)
Basophils Relative: 0 %
Eosinophils Absolute: 0.2 10*3/uL (ref 0.0–0.5)
Eosinophils Relative: 2 %
HCT: 31.3 % — ABNORMAL LOW (ref 39.0–52.0)
Hemoglobin: 10.2 g/dL — ABNORMAL LOW (ref 13.0–17.0)
Immature Granulocytes: 1 %
Lymphocytes Relative: 7 %
Lymphs Abs: 0.7 10*3/uL (ref 0.7–4.0)
MCH: 30.6 pg (ref 26.0–34.0)
MCHC: 32.6 g/dL (ref 30.0–36.0)
MCV: 94 fL (ref 80.0–100.0)
Monocytes Absolute: 0.7 10*3/uL (ref 0.1–1.0)
Monocytes Relative: 7 %
Neutro Abs: 8.1 10*3/uL — ABNORMAL HIGH (ref 1.7–7.7)
Neutrophils Relative %: 83 %
Platelets: 406 10*3/uL — ABNORMAL HIGH (ref 150–400)
RBC: 3.33 MIL/uL — ABNORMAL LOW (ref 4.22–5.81)
RDW: 14.5 % (ref 11.5–15.5)
WBC: 9.7 10*3/uL (ref 4.0–10.5)
nRBC: 0 % (ref 0.0–0.2)

## 2021-12-18 LAB — COMPREHENSIVE METABOLIC PANEL
ALT: 20 U/L (ref 0–44)
AST: 21 U/L (ref 15–41)
Albumin: 3.5 g/dL (ref 3.5–5.0)
Alkaline Phosphatase: 104 U/L (ref 38–126)
Anion gap: 10 (ref 5–15)
BUN: 32 mg/dL — ABNORMAL HIGH (ref 8–23)
CO2: 24 mmol/L (ref 22–32)
Calcium: 9.3 mg/dL (ref 8.9–10.3)
Chloride: 100 mmol/L (ref 98–111)
Creatinine, Ser: 2.44 mg/dL — ABNORMAL HIGH (ref 0.61–1.24)
GFR, Estimated: 26 mL/min — ABNORMAL LOW (ref >60)
Glucose, Bld: 100 mg/dL — ABNORMAL HIGH (ref 70–99)
Potassium: 4 mmol/L (ref 3.5–5.1)
Sodium: 134 mmol/L — ABNORMAL LOW (ref 135–145)
Total Bilirubin: 1 mg/dL (ref 0.3–1.2)
Total Protein: 7.3 g/dL (ref 6.5–8.1)

## 2021-12-18 LAB — URINALYSIS, ROUTINE W REFLEX MICROSCOPIC
Bilirubin Urine: NEGATIVE
Glucose, UA: NEGATIVE mg/dL
Ketones, ur: NEGATIVE mg/dL
Leukocytes,Ua: NEGATIVE
Nitrite: NEGATIVE
Protein, ur: NEGATIVE mg/dL
RBC / HPF: >50 RBC/hpf — ABNORMAL HIGH (ref 0–5)
Specific Gravity, Urine: 1.008 (ref 1.005–1.030)
pH: 5 (ref 5.0–8.0)

## 2021-12-18 LAB — SURGICAL PCR SCREEN
MRSA, PCR: NEGATIVE
Staphylococcus aureus: NEGATIVE

## 2021-12-18 LAB — PROTIME-INR
INR: 2.5 — ABNORMAL HIGH (ref 0.8–1.2)
Prothrombin Time: 27.2 seconds — ABNORMAL HIGH (ref 11.4–15.2)

## 2021-12-18 LAB — TYPE AND SCREEN
ABO/RH(D): O NEG
Antibody Screen: NEGATIVE

## 2021-12-18 LAB — LACTATE DEHYDROGENASE: LDH: 314 U/L — ABNORMAL HIGH (ref 98–192)

## 2021-12-18 MED ORDER — PANTOPRAZOLE SODIUM 40 MG PO TBEC
40.0000 mg | DELAYED_RELEASE_TABLET | Freq: Every day | ORAL | Status: DC
  Administered 2021-12-19: 40 mg via ORAL
  Filled 2021-12-18: qty 1

## 2021-12-18 MED ORDER — SODIUM CHLORIDE 0.9% FLUSH
3.0000 mL | Freq: Two times a day (BID) | INTRAVENOUS | Status: DC
  Administered 2021-12-18 – 2021-12-19 (×2): 3 mL via INTRAVENOUS

## 2021-12-18 MED ORDER — SODIUM CHLORIDE 0.9 % IV BOLUS
2000.0000 mL | Freq: Once | INTRAVENOUS | Status: AC
Start: 2021-12-18 — End: 2021-12-19
  Administered 2021-12-18: 2000 mL via INTRAVENOUS

## 2021-12-18 MED ORDER — ONDANSETRON HCL 4 MG/2ML IJ SOLN: 4.0000 mg | Freq: Four times a day (QID) | INTRAMUSCULAR | Status: DC | PRN

## 2021-12-18 MED ORDER — WARFARIN SODIUM 5 MG PO TABS
5.0000 mg | ORAL_TABLET | Freq: Every day | ORAL | Status: DC
  Administered 2021-12-18 – 2021-12-19 (×2): 5 mg via ORAL
  Filled 2021-12-18 (×2): qty 1

## 2021-12-18 MED ORDER — SODIUM CHLORIDE 0.9 % IV SOLN
250.0000 mL | INTRAVENOUS | Status: DC | PRN
Start: 2021-12-18 — End: 2021-12-19

## 2021-12-18 MED ORDER — ACETAMINOPHEN 325 MG PO TABS: 650.0000 mg | ORAL_TABLET | ORAL | Status: DC | PRN

## 2021-12-18 MED ORDER — CITALOPRAM HYDROBROMIDE 20 MG PO TABS
10.0000 mg | ORAL_TABLET | Freq: Every day | ORAL | Status: DC
  Administered 2021-12-19: 10 mg via ORAL
  Filled 2021-12-18: qty 1

## 2021-12-18 MED ORDER — SODIUM CHLORIDE 0.9% FLUSH
3.0000 mL | INTRAVENOUS | Status: DC | PRN
Start: 2021-12-18 — End: 2021-12-19

## 2021-12-18 MED ORDER — MELATONIN 5 MG PO TABS
5.0000 mg | ORAL_TABLET | Freq: Every evening | ORAL | Status: DC | PRN
  Administered 2021-12-18: 5 mg via ORAL
  Filled 2021-12-18: qty 1

## 2021-12-18 MED ORDER — SODIUM CHLORIDE 0.9 % IV SOLN: INTRAVENOUS | Status: DC

## 2021-12-18 MED ORDER — MIDODRINE HCL 5 MG PO TABS
10.0000 mg | ORAL_TABLET | Freq: Three times a day (TID) | ORAL | Status: DC
  Administered 2021-12-18 – 2021-12-19 (×2): 10 mg via ORAL
  Filled 2021-12-18 (×2): qty 2

## 2021-12-18 MED ORDER — MEXILETINE HCL 150 MG PO CAPS
150.0000 mg | ORAL_CAPSULE | Freq: Two times a day (BID) | ORAL | Status: DC
  Administered 2021-12-18 – 2021-12-19 (×2): 150 mg via ORAL
  Filled 2021-12-18 (×3): qty 1

## 2021-12-18 MED ORDER — WARFARIN - PHARMACIST DOSING INPATIENT: Freq: Every day | Status: DC

## 2021-12-18 MED ORDER — LEVOTHYROXINE SODIUM 75 MCG PO TABS
175.0000 ug | ORAL_TABLET | Freq: Every day | ORAL | Status: DC
  Administered 2021-12-19: 175 ug via ORAL
  Filled 2021-12-18: qty 1

## 2021-12-18 MED ORDER — TRAMADOL HCL 50 MG PO TABS
50.0000 mg | ORAL_TABLET | Freq: Four times a day (QID) | ORAL | Status: DC | PRN
  Administered 2021-12-18 – 2021-12-19 (×2): 50 mg via ORAL
  Filled 2021-12-18 (×2): qty 1

## 2021-12-18 MED ORDER — ROSUVASTATIN CALCIUM 20 MG PO TABS
20.0000 mg | ORAL_TABLET | Freq: Every day | ORAL | Status: DC
  Administered 2021-12-19: 20 mg via ORAL
  Filled 2021-12-18: qty 1

## 2021-12-18 MED ORDER — MUPIROCIN 2 % EX OINT
1.0000 "application " | TOPICAL_OINTMENT | Freq: Two times a day (BID) | CUTANEOUS | Status: DC
  Administered 2021-12-18 – 2021-12-19 (×2): 1 via NASAL
  Filled 2021-12-18: qty 22

## 2021-12-18 MED ORDER — ASPIRIN EC 81 MG PO TBEC
81.0000 mg | DELAYED_RELEASE_TABLET | Freq: Every morning | ORAL | Status: DC
  Administered 2021-12-19: 81 mg via ORAL
  Filled 2021-12-18: qty 1

## 2021-12-18 MED ORDER — TRAZODONE HCL 50 MG PO TABS
50.0000 mg | ORAL_TABLET | Freq: Every evening | ORAL | Status: DC | PRN
  Administered 2021-12-18: 50 mg via ORAL
  Filled 2021-12-18: qty 1

## 2021-12-18 NOTE — ED Provider Notes (Signed)
?[facility] ?Provider Note ? ? ?CSN: [id number] ?Arrival date & time: 12/18/21  1234 ? ?  ? ?History ? ?Chief Complaint  ?Patient presents with  ? Dizziness  ? ? ?Johnny Oneill is a 80 y.o. 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 Haroldine Laws), atrial tachycardia, frequent PVCs, atrial flutter, CAD, CKD stage IV, hypertension, hyperlipidemia, hypothyroidism, chronic anticoagulation with Coumadin, orthostatic hypotension. ? ?Presents to the emergency department with a chief complaint of lightheadedness and syncopal episodes.  Patient reports that since his total right knee replacement on 12/03/2021 he has been having issues with orthostatic hypotension.  Patient reports that anytime he stands up he feels lightheaded.  This became worse yesterday with patient having 2 syncopal episodes.  Patient's wife reports that patient did not hit his head in either episode she was able to help him to the floor.  Patient had episode of near syncope today when getting out of his car after a doctor's appointment.  Patient was advised to come to the emergency department by his cardiologist Dr. Haroldine Laws.  Patient denies any preceding chest pain, shortness of breath, palpitations, sudden onset of headache.   ? ?Patient denies any fevers, chills, neck pain, back pain, visual disturbance, numbness, weakness, facial asymmetry, dysarthria, abdominal pain, nausea, vomiting, diarrhea, melena, blood in stool, dysuria, hematuria, urinary urgency.   ? ? ? ? ? ?Dizziness ?Associated symptoms: no chest pain, no headaches, no nausea, no palpitations, no shortness of breath, no vomiting and no weakness   ? ?  ? ?Home Medications ?Prior to Admission medications   ?Medication Sig Start Date End Date Taking? Authorizing Provider  ?acetaminophen (TYLENOL) 500 MG tablet Take 500 mg by mouth every 6 (six) hours as needed (pain).    [provider]  ?aspirin  EC 81 MG tablet Take 81 mg by mouth in the morning.    [provider]  ?cholecalciferol (VITAMIN D3) 25 MCG (1000 UT) tablet Take 1,000 Units by mouth in the morning.    [provider]  ?citalopram (CELEXA) 10 MG tablet Take 1 tablet by mouth once daily 03/19/21   Bensimhon, Shaune Pascal, MD  ?Coenzyme Q10 (COQ10) 200 MG CAPS Take 200 mg by mouth in the morning.    [provider]  ?famotidine (PEPCID) 20 MG tablet Take 20 mg by mouth in the morning.    [provider]  ?furosemide (LASIX) 20 MG tablet Take 1 tablet (20 mg total) by mouth daily as needed (Take 20 mg as daily as needed for wt > 153 lbs). Reported on 01/14/2016 ?Patient not taking: Reported on 11/26/2021 02/21/21   Shirley Friar, PA-C  ?melatonin 5 MG TABS Take 5 mg by mouth at bedtime as needed (sleep).    [provider]  ?mexiletine (MEXITIL) 150 MG capsule Take 1 capsule (150 mg total) by mouth 2 (two) times daily. 06/19/21   Bensimhon, Shaune Pascal, MD  ?midodrine (PROAMATINE) 10 MG tablet Take 1 tablet (10 mg total) by mouth 3 (three) times daily with meals. 12/12/21   Clegg, Amy D, NP  ?neomycin-bacitracin-polymyxin (NEOSPORIN) ointment Apply 1 application. topically as needed for wound care. ?Patient not taking: Reported on 11/26/2021    [provider]  ?oxyCODONE (OXY IR/ROXICODONE) 5 MG immediate release tablet Take 1 tablet (5 mg total) by mouth every 12 (twelve) hours as needed for breakthrough pain. 12/12/21   Clegg, Amy D, NP  ?pantoprazole (PROTONIX) 40 MG  tablet Take 1 tablet by mouth once daily 08/11/21   Bensimhon, Shaune Pascal, MD  ?potassium chloride SA (KLOR-CON) 20 MEQ tablet Take 1 tablet (20 mEq total) by mouth daily as needed. Take one potassium pill when you take your lasix. ?Patient not taking: Reported on 11/26/2021 02/21/21   Shirley Friar, PA-C  ?Probiotic Product (PROBIOTIC PO) Take 1 capsule by mouth in the morning.    [provider]  ?rosuvastatin  (CRESTOR) 20 MG tablet Take 1 tablet (20 mg total) by mouth daily. 11/26/21   Bensimhon, Shaune Pascal, MD  ?sotalol (BETAPACE) 80 MG tablet TAKE 1 TABLET (80 MG TOTAL) BY MOUTH DAILY. 07/22/21 07/22/22  Bensimhon, Shaune Pascal, MD  ?SYNTHROID 175 MCG tablet 1 tablet daily every morning except half tablet on Sundays ?Patient taking differently: 1 tablet daily every morning 10/08/21   Elayne Snare, MD  ?traMADol (ULTRAM) 50 MG tablet Take 1-2 tablets (50-100 mg total) by mouth every 6 (six) hours as needed for moderate pain. 12/12/21   Clegg, Amy D, NP  ?traZODone (DESYREL) 50 MG tablet Take 1 tablet (50 mg total) by mouth at bedtime. ?Patient taking differently: Take 50 mg by mouth at bedtime as needed for sleep. 12/31/20   Bensimhon, Shaune Pascal, MD  ?warfarin (COUMADIN) 5 MG tablet Take 7.5 mg 4/14 then 5 mg (1 tablet) every day as directed by HF Clinic 12/12/21   Conrad Muir, NP  ?   ? ?Allergies    ?Ace inhibitors, Amiodarone, Diovan [valsartan], Lipitor [atorvastatin calcium], Jardiance [empagliflozin], and Norvasc [amlodipine]   ? ?Review of Systems   ?Review of Systems  ?Constitutional:  Negative for chills and fever.  ?Eyes:  Negative for visual disturbance.  ?Respiratory:  Negative for shortness of breath.   ?Cardiovascular:  Negative for chest pain, palpitations and leg swelling.  ?Gastrointestinal:  Negative for abdominal pain, nausea and vomiting.  ?Genitourinary:  Negative for difficulty urinating, dysuria, flank pain, frequency, hematuria, penile discharge, penile pain, penile swelling, scrotal swelling and urgency.  ?Musculoskeletal:  Negative for back pain and neck pain.  ?Skin:  Negative for color change and rash.  ?Neurological:  Positive for syncope and light-headedness. Negative for dizziness, tremors, seizures, facial asymmetry, speech difficulty, weakness, numbness and headaches.  ?Psychiatric/Behavioral:  Negative for confusion.   ? ?Physical Exam ?Updated Vital Signs ?Pulse 81   Temp 97.6 ?F (36.4 ?C) (Oral)    Resp 18   SpO2 99%  ?Physical Exam ?Vitals and nursing note reviewed.  ?Constitutional:   ?   General: He is not in acute distress. ?   Appearance: He is not ill-appearing, toxic-appearing or diaphoretic.  ?HENT:  ?   Head: Normocephalic and atraumatic. No raccoon eyes, Battle's sign, abrasion, contusion, right periorbital erythema or left periorbital erythema.  ?Eyes:  ?   General: No scleral icterus.    ?   Right eye: No discharge.     ?   Left eye: No discharge.  ?Cardiovascular:  ?   Rate and Rhythm: Normal rate.  ?   Comments: LVAD in place with audible hum ?Pulmonary:  ?   Effort: Pulmonary effort is normal. No tachypnea, bradypnea or respiratory distress.  ?   Breath sounds: Normal breath sounds. No stridor.  ?Abdominal:  ?   General: Abdomen is flat. There is no distension.  ?   Palpations: Abdomen is soft. There is no mass or pulsatile mass.  ?   Tenderness: There is no abdominal tenderness. There is no guarding or rebound.  ?  Musculoskeletal:  ?   Comments: No midline tenderness deformity to cervical, thoracic, or lumbar spine.  No tenderness, bony tenderness, deformity to bilateral upper extremities or left lower extremity. ? ?Well-healed surgical incision to right knee with no surrounding erythema, warmth, or purulent discharge.  Minimal swelling to associated right knee. ? ?  ?Skin: ?   General: Skin is warm and dry.  ?Neurological:  ?   General: No focal deficit present.  ?   Mental Status: He is alert and oriented to person, place, and time.  ?   GCS: GCS eye subscore is 4. GCS verbal subscore is 5. GCS motor subscore is 6.  ?Psychiatric:     ?   Behavior: Behavior is cooperative.  ? ? ?ED Results / Procedures / Treatments   ?Labs ?(all labs ordered are listed, but only abnormal results are displayed) ?Labs Reviewed  ?COMPREHENSIVE METABOLIC PANEL - Abnormal; Notable for the following components:  ?    Result Value  ? Sodium 134 (*)   ? Glucose, Bld 100 (*)   ? BUN 32 (*)   ? Creatinine, Ser 2.44  (*)   ? GFR, Estimated 26 (*)   ? All other components within normal limits  ?LACTATE DEHYDROGENASE - Abnormal; Notable for the following components:  ? LDH 314 (*)   ? All other components within normal limits

## 2021-12-18 NOTE — ED Notes (Signed)
Report given Cyril Mourning, RN ? ?

## 2021-12-18 NOTE — H&P (Addendum)
?Advanced Heart Failure VAD History and Physical Note  ? ?PCP-Cardiologist: None  ? ?Reason for Admission: Syncope ? ?HPI:   ? ?Johnny Oneill is a 81 y.o. 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  ?  ?Presented to ER on 06/27/19 with syncopal episode and head injury from fall. ECG showed VT @ 240. Underwent emergent DC-CV in ER. Head CT no bleed. Admitted and had ICD gen replacement (EOL). ?  ?Did not tolerate amio due to tremors. ?  ?Had ICD shock for VT on 06/09/20. Started on sotalol ?  ?On 02/06/21 Paced out of AFL. ?  ?Seen in ER 05/2021 due to LOW FLOW alarms. Found to be in VT rate 170. DCCV x 1 - returned to [facility] with frequent PVCs. Pacer settings adjusted by [name] PA- DDD 90. Post cardioversion persistent LOW FLOWS noted. Received 2 L IVF. Low flows resolved, and he was discharged home.  ?  ?[facility] 11/22 ?RA = 5 ?RV = 33/5 ?PA = 33/13 (20) ?PCW = 10 ?Fick cardiac output/index = 4.0/2.1 ?Thermo CO/CI = 4.9/2.6 ?PVR = 2.0 WU ?Ao sat = 95% ?PA sat = 54%, 59% ?PAPI = 4.0 ?  ?Admitted earlier this month for R TKA. Did well with surgery but complicated by severe orthostatic hypotension. Now on midodrine 10 tid. BP improved.  ? ?Presented to ED this afternoon with complaints of recurrent syncopal episodes. Reports he fell twice yesterday after he got dizzy when he stood up. His wife does not believe he had any head trauma. Had another episode today when he was getting out of the car to go to his orthopedic appointment. Got dizzy when attempted to stand in the office. He states he has been trying to drink fluids but has not been eating well. No episodes of diarrhea or melena. ? ? ?LVAD INTERROGATION:  ?HeartMate II LVAD:  Flow 3.9 liters/min, speed 9600, power 5.2, PI 6.4.  Multiple PI events. No low flow alarms ? ? ?Review of Systems: [y] = yes, [ ]  = no  ? ?General: Weight gain [ ] ; Weight loss [ ] ; Anorexia [ ] ;  Fatigue [ ] ; Fever [ ] ; Chills [ ] ; Weakness [ ]   ?Cardiac: Chest pain/pressure [ ] ; Resting SOB [ ] ; Exertional SOB [Y ]; Orthopnea [ ] ; Pedal Edema [ ] ; Palpitations [ ] ; Syncope [Y]; Presyncope [ ] ; Paroxysmal nocturnal dyspnea[ ]   ?Pulmonary: Cough [ ] ; Wheezing[ ] ; Hemoptysis[ ] ; Sputum [ ] ; Snoring [ ]   ?GI: Vomiting[ ] ; Dysphagia[ ] ; Melena[ ] ; Hematochezia [ ] ; Heartburn[ ] ; Abdominal pain [ ] ; Constipation [ ] ; Diarrhea [ ] ; BRBPR [ ]   ?GU: Hematuria[ ] ; Dysuria [ ] ; Nocturia[ ]   ?Vascular: Pain in legs with walking [ ] ; Pain in feet with lying flat [ ] ; Non-healing sores [ ] ; Stroke [ ] ; TIA [ ] ; Slurred speech [ ] ;  ?Neuro: Headaches[ ] ; Vertigo[ ] ; Seizures[ ] ; Paresthesias[ ] ;Blurred vision [ ] ; Diplopia [ ] ; Vision changes [ ]   ?Ortho/Skin: Arthritis [ ] ; Joint pain [Y]; Muscle pain [ ] ; Joint swelling [Y ]; Back Pain [ ] ; Rash [ ]   ?Psych: Depression[ ] ; Anxiety[ ]   ?Heme: Bleeding problems [ ] ; Clotting disorders [ ] ; Anemia [ ]   ?Endocrine: Diabetes [ ] ; Thyroid dysfunction[Y] ?  ? ?Home Medications ?Prior to Admission medications   ?Medication Sig Start Date End Date Taking? Authorizing Provider  ?acetaminophen (TYLENOL) 500 MG  tablet Take 1,000 mg by mouth every 6 (six) hours as needed (pain).   Yes [provider]  ?aspirin EC 81 MG tablet Take 81 mg by mouth in the morning.   Yes [provider]  ?Cholecalciferol (VITAMIN D) 125 MCG (5000 UT) CAPS Take 5,000 Units by mouth in the morning.   Yes [provider]  ?citalopram (CELEXA) 10 MG tablet Take 1 tablet by mouth once daily ?Patient taking differently: Take 10 mg by mouth every morning. 03/19/21  Yes Lyndal Reggio, Shaune Pascal, MD  ?Coenzyme Q10 (COQ10) 200 MG CAPS Take 200 mg by mouth in the morning.   Yes [provider]  ?Famotidine (PEPCID PO) Take 1 tablet by mouth every morning.   Yes [provider]  ?melatonin 5 MG TABS Take 5 mg by mouth at bedtime.   Yes [provider]  ?mexiletine  (MEXITIL) 150 MG capsule Take 1 capsule (150 mg total) by mouth 2 (two) times daily. 06/19/21  Yes Traylen Eckels, Shaune Pascal, MD  ?midodrine (PROAMATINE) 10 MG tablet Take 1 tablet (10 mg total) by mouth 3 (three) times daily with meals. 12/12/21  Yes Clegg, Amy D, NP  ?oxyCODONE (OXY IR/ROXICODONE) 5 MG immediate release tablet Take 1 tablet (5 mg total) by mouth every 12 (twelve) hours as needed for breakthrough pain. 12/12/21  Yes Clegg, Amy D, NP  ?pantoprazole (PROTONIX) 40 MG tablet Take 1 tablet by mouth once daily ?Patient taking differently: 40 mg every morning. 08/11/21  Yes Laken Lobato, Shaune Pascal, MD  ?Probiotic Product (PROBIOTIC PO) Take 1 capsule by mouth in the morning.   Yes [provider]  ?rosuvastatin (CRESTOR) 20 MG tablet Take 1 tablet (20 mg total) by mouth daily. ?Patient taking differently: Take 20 mg by mouth every morning. 11/26/21  Yes Leidi Astle, Shaune Pascal, MD  ?sotalol (BETAPACE) 80 MG tablet TAKE 1 TABLET (80 MG TOTAL) BY MOUTH DAILY. ?Patient taking differently: Take 80 mg by mouth every morning. 07/22/21 07/22/22 Yes Dionicia Cerritos, Shaune Pascal, MD  ?SYNTHROID 175 MCG tablet 1 tablet daily every morning except half tablet on Sundays ?Patient taking differently: Take 87.5-175 mcg by mouth See admin instructions. Take one tablet (175 mcg) by mouth before breakfast Monday thru Saturday, take 1/2 tablet (87.5 mcg) on Sundays before breakfast 10/08/21  Yes Elayne Snare, MD  ?traMADol (ULTRAM) 50 MG tablet Take 1-2 tablets (50-100 mg total) by mouth every 6 (six) hours as needed for moderate pain. 12/12/21  Yes Clegg, Amy D, NP  ?traZODone (DESYREL) 50 MG tablet Take 1 tablet (50 mg total) by mouth at bedtime. 12/31/20  Yes Armelia Penton, Shaune Pascal, MD  ?warfarin (COUMADIN) 5 MG tablet Take 7.5 mg 4/14 then 5 mg (1 tablet) every day as directed by HF Clinic ?Patient taking differently: Take 5 mg by mouth at bedtime. Or as directed by HF Clinic 12/12/21  Yes Clegg, Amy D, NP  ?furosemide (LASIX) 20 MG tablet Take 1  tablet (20 mg total) by mouth daily as needed (Take 20 mg as daily as needed for wt > 153 lbs). Reported on 01/14/2016 ?Patient not taking: Reported on 11/26/2021 02/21/21   Shirley Friar, PA-C  ?potassium chloride SA (KLOR-CON) 20 MEQ tablet Take 1 tablet (20 mEq total) by mouth daily as needed. Take one potassium pill when you take your lasix. ?Patient not taking: Reported on 11/26/2021 02/21/21   Shirley Friar, PA-C  ? ? ?Past Medical History: ?Past Medical History:  ?Diagnosis Date  ? Anxiety   ?  Automatic implantable cardioverter-defibrillator in situ   ? CAD (coronary artery disease)   ? S/P stenting of LCX in 2004;  s/p Lat MI 2009 with occlusion of the LCX - treated with Promus stenting. Has total occlusion of the RCA.   ? CHF (congestive heart failure) ([place])   ? Hypercholesteremia   ? Hypertension   ? Hypothyroidism   ? ICD (implantable cardiac defibrillator) in place   ? PROPHYLACTIC      medtronic  ? Inferior MI ([place]) "? date"; 2009  ? Ischemic cardiomyopathy   ? a. 10/09 Echo: Sev LV Dysfxn, inf/lat AK, Mod MR.  ? Liver disease   ? LVAD (left ventricular assist device) present ([place])   ? Osteoarthritis   ? a. s/p R TKR  ? Pacemaker   ? PVC (premature ventricular contraction)   ? RBBB   ? Shortness of breath   ? ? ?Past Surgical History: ?Past Surgical History:  ?Procedure Laterality Date  ? CARPAL TUNNEL RELEASE Right 05/29/2016  ? Procedure: CARPAL TUNNEL RELEASE;  Surgeon: Earlie Server, MD;  Location: [facility];  Service: Orthopedics;  Laterality: Right;  ? CORONARY ANGIOPLASTY WITH STENT PLACEMENT  2004  ? CORONARY ANGIOPLASTY WITH STENT PLACEMENT  07/2008  ? DEFIBRILLATOR  2009  ? ESOPHAGOGASTRODUODENOSCOPY N/A 09/15/2013  ? Procedure: ESOPHAGOGASTRODUODENOSCOPY (EGD);  Surgeon: Ladene Artist, MD;  Location: [facility] ENDOSCOPY;  Service: Endoscopy;  Laterality: N/A;  bedside  ? HEMATOMA EVACUATION Right 11/06/2013  ? Procedure: EVACUATION HEMATOMA;  Surgeon: Gaye Pollack, MD;  Location: [facility];   Service: Cardiothoracic;  Laterality: Right;  ? ICD GENERATOR CHANGEOUT N/A 06/28/2019  ? Procedure: ICD GENERATOR CHANGEOUT;  Surgeon: Constance Haw, MD;  Location: [facility] CV LAB;  Service: Musician

## 2021-12-18 NOTE — Progress Notes (Signed)
LVAD Coordinator ED Encounter ? ?Patients wife called VAD Clinic earlier to report patient continues to have dizzy spells since two syncopal episodes with falls that occurred yesterday evening. She called saying patient is very weak, and is very dizzy with standing. Dr. Haroldine Laws instructed patient to come to ED after seeing Dr. Deatra Ina (Orthopedics) this am. VAD Charge notified that patient would be arriving via private vehicle.  ? ?LVAD is a HM II and was implanted on 09/18/13 by Dr. Darcey [name] for BTT; he is now Destination Therapy VAD. He is managed by Dr. Haroldine Laws.  ? ?Met patient in Trauma A along with Jeneen Rinks, PA. ED doctor and nurse at bedside. Orthostatics obtained - see below. ? ?Medtr[employer] rep at bedside and interrogated device. Dr. Haroldine Laws updated - underlying rate of patient 30's. Rate response turned on, will stop sotalol, 2 liters NS bolus ordered per Dr. Haroldine Laws.  ED nurse hung NS bolus.  ? ?Accompanied patient and ED nurse to CT; CT head completed and returned patient to Trauma A. Admission to [facility] pending. Wife at bedside in ED.  ? ? ?Vital signs:      Lying:     Standing:  Flow:  PI: Power:  ?HR:     80     80   3.7  6.5 5.3w  ?Doppler MAP:  100     60   3.8  5.6 5.3w ?Automated BP: 115/96 (103)    ---    ?O2 Sat:   99% RA ? ?LVAD interrogation reveals:  ?Speed: 9600 ?Flow:   3.7 ?Power:  5.3w ?PI: 6.5 ? ?Alarms: PUMP OFF 12/16/21 - controller change out in clinic ?Events:  30 today; 15 yesterday ? ? ?Drive Line:  Dressing C/D/I with anchor intact and accurately applied. Weekly dressing changes per wife.  ? ?Updated VAD Dr. Haroldine Laws, Jeneen Rinks, [place] and Darrick Grinder, NP about the above. No LVAD issues and pump is functioning as expected. Able to independently manage LVAD equipment. No LVAD needs at this time.  ? ? ?Zada Girt RN ?VAD Coordinator  ? ?Office: [phone] ?24/7 Emergency VAD Pager: [phone] ? ?

## 2021-12-18 NOTE — Plan of Care (Signed)
  Problem: Education: Goal: Knowledge of General Education information will improve Description: Including pain rating scale, medication(s)/side effects and non-pharmacologic comfort measures Outcome: Progressing   Problem: Health Behavior/Discharge Planning: Goal: Ability to manage health-related needs will improve Outcome: Progressing   Problem: Clinical Measurements: Goal: Ability to maintain clinical measurements within normal limits will improve Outcome: Progressing Goal: Will remain free from infection Outcome: Progressing   Problem: Nutrition: Goal: Adequate nutrition will be maintained Outcome: Progressing   

## 2021-12-18 NOTE — ED Triage Notes (Signed)
Patient complains of intermittent lightheadedness since having right knee surgery on April 5. Patient states nothing seems to make symptoms better or worse and denies nausea, shortness of breath, and chest pain when lightheadedness occurs. Patient is alert, oriented, and in no apparent distress. Patient has LVAD and was referred to ED by Bensimhon. ?

## 2021-12-18 NOTE — ED Notes (Signed)
Per LVAD coordinator patient was extremely hypotensive upon standing SBP 60's standing and SBP115 lying.  ?

## 2021-12-18 NOTE — Progress Notes (Signed)
ANTICOAGULATION CONSULT NOTE ?Pharmacy Consult for warfarin  ?Indication: LVAD ? ?Allergies  ?Allergen Reactions  ? Ace Inhibitors   ?  Hypotension and elevated creatinine  ? Amiodarone Other (See Comments)  ?  Cerebellar side effects  ? Diovan [Valsartan] Other (See Comments)  ?  Hypotension at low dose, hyperkalemia  ? Lipitor [Atorvastatin Calcium] Other (See Comments)  ?  Muscle pain  ? Jardiance [Empagliflozin] Other (See Comments)  ?  Unsure of exact side effect  ? Norvasc [Amlodipine] Other (See Comments)  ?  Put patient to sleep  ? ? ?Patient Measurements: ?  ? Heparin dosing weight: 71.1 kg ? ?Vital Signs: ?Temp: 98.6 ?F (37 ?C) (04/20 1443) ?Temp Source: Temporal (04/20 1443) ?BP: 119/103 (04/20 1443) ?Pulse Rate: 79 (04/20 1443) ? ?Labs: ?Recent Labs  ?  12/16/21 ?0956 12/18/21 ?1305  ?HGB 10.4* 10.2*  ?HCT 32.2* 31.3*  ?PLT 399 406*  ?LABPROT 28.5* 27.2*  ?INR 2.7* 2.5*  ?CREATININE 2.39* 2.44*  ? ? ?Estimated Creatinine Clearance: 23.3 mL/min (A) (by C-G formula based on SCr of 2.44 mg/dL (H)). ? ? 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 Pharmacy consulted to dose warfarin.  ? ?INR within goal at 2.5 on admit this afternoon. Hgb stable 10s. LDH stable low 300s.  ? ?PTA warfarin dose: 5 mg daily ? 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  ?Plan:  ?Continue home dose 5mg  daily ?Daily INR ? ?Erin Hearing PharmD., BCPS ?Clinical Pharmacist ?12/18/2021 2:59 PM ? ?

## 2021-12-18 NOTE — ED Notes (Signed)
LVAD coordinator at bedside assessing patient and completing orthostatics.  ?

## 2021-12-18 NOTE — ED Notes (Signed)
[employer] tech at [facility].   ?

## 2021-12-19 ENCOUNTER — Inpatient Hospital Stay (HOSPITAL_BASED_OUTPATIENT_CLINIC_OR_DEPARTMENT_OTHER): Payer: Medicare Other

## 2021-12-19 DIAGNOSIS — Z95811 Presence of heart assist device: Secondary | ICD-10-CM

## 2021-12-19 DIAGNOSIS — N184 Chronic kidney disease, stage 4 (severe): Secondary | ICD-10-CM | POA: Diagnosis not present

## 2021-12-19 DIAGNOSIS — R55 Syncope and collapse: Secondary | ICD-10-CM

## 2021-12-19 DIAGNOSIS — I5022 Chronic systolic (congestive) heart failure: Secondary | ICD-10-CM | POA: Diagnosis not present

## 2021-12-19 DIAGNOSIS — I130 Hypertensive heart and chronic kidney disease with heart failure and stage 1 through stage 4 chronic kidney disease, or unspecified chronic kidney disease: Secondary | ICD-10-CM | POA: Diagnosis not present

## 2021-12-19 LAB — ECHOCARDIOGRAM COMPLETE
S' Lateral: 5 cm
Weight: 2522.06 oz

## 2021-12-19 LAB — CBC
HCT: 25.8 % — ABNORMAL LOW (ref 39.0–52.0)
Hemoglobin: 8.2 g/dL — ABNORMAL LOW (ref 13.0–17.0)
MCH: 29.8 pg (ref 26.0–34.0)
MCHC: 31.8 g/dL (ref 30.0–36.0)
MCV: 93.8 fL (ref 80.0–100.0)
Platelets: 304 10*3/uL (ref 150–400)
RBC: 2.75 MIL/uL — ABNORMAL LOW (ref 4.22–5.81)
RDW: 14.6 % (ref 11.5–15.5)
WBC: 5.1 10*3/uL (ref 4.0–10.5)
nRBC: 0 % (ref 0.0–0.2)

## 2021-12-19 LAB — BASIC METABOLIC PANEL
Anion gap: 10 (ref 5–15)
BUN: 27 mg/dL — ABNORMAL HIGH (ref 8–23)
CO2: 20 mmol/L — ABNORMAL LOW (ref 22–32)
Calcium: 8.6 mg/dL — ABNORMAL LOW (ref 8.9–10.3)
Chloride: 109 mmol/L (ref 98–111)
Creatinine, Ser: 2.11 mg/dL — ABNORMAL HIGH (ref 0.61–1.24)
GFR, Estimated: 31 mL/min — ABNORMAL LOW (ref >60)
Glucose, Bld: 91 mg/dL (ref 70–99)
Potassium: 4.3 mmol/L (ref 3.5–5.1)
Sodium: 139 mmol/L (ref 135–145)

## 2021-12-19 LAB — PROTIME-INR
INR: 2.8 — ABNORMAL HIGH (ref 0.8–1.2)
Prothrombin Time: 29.5 seconds — ABNORMAL HIGH (ref 11.4–15.2)

## 2021-12-19 LAB — LACTATE DEHYDROGENASE: LDH: 254 U/L — ABNORMAL HIGH (ref 98–192)

## 2021-12-19 LAB — MAGNESIUM: Magnesium: 2 mg/dL (ref 1.7–2.4)

## 2021-12-19 MED ORDER — PERFLUTREN LIPID MICROSPHERE
1.0000 mL | INTRAVENOUS | Status: AC | PRN
  Administered 2021-12-19: 2 mL via INTRAVENOUS
  Filled 2021-12-19: qty 10

## 2021-12-19 MED ORDER — MIDODRINE HCL 5 MG PO TABS
15.0000 mg | ORAL_TABLET | Freq: Three times a day (TID) | ORAL | Status: DC
  Administered 2021-12-19: 15 mg via ORAL
  Filled 2021-12-19: qty 3

## 2021-12-19 MED ORDER — MIDODRINE HCL 10 MG PO TABS: 15.0000 mg | ORAL_TABLET | Freq: Three times a day (TID) | ORAL | 3 refills | Status: DC

## 2021-12-19 MED ORDER — CHLORHEXIDINE GLUCONATE CLOTH 2 % EX PADS
6.0000 | MEDICATED_PAD | Freq: Every day | CUTANEOUS | Status: DC
  Administered 2021-12-19: 6 via TOPICAL

## 2021-12-19 NOTE — Progress Notes (Addendum)
?Advanced Heart Failure VAD Team Note ? ?PCP-Cardiologist: None  ? ?Subjective:   ? ?4/20 Admitted with syncope. CT head negative. Given IV fluids. Overall given 3 liters. No arrhythmias on interrogation. Underlying rate 20-30s. Sotalol stopped.  ? ?Feels ok. Denies dizziness ? ? ?LVAD INTERROGATION:  ?HeartMate II LVAD:   ?Flow 4.1 liters/min, speed 9600, power 6, PI 6.3 .   ? ?Objective:   ? ?Vital Signs:   ?Temp:  [97.6 ?F (36.4 ?C)-98.7 ?F (37.1 ?C)] 98.2 ?F (36.8 ?C) (04/21 0749) ?Pulse Rate:  [79-88] 79 (04/21 0625) ?Resp:  [9-18] 17 (04/21 8676) ?BP: (95-141)/(82-106) 111/95 (04/21 0600) ?SpO2:  [94 %-100 %] 95 % (04/21 0625) ?Weight:  [71.5 kg] 71.5 kg (04/21 0625) ?Last BM Date : 12/17/21 ?Mean arterial Pressure 90s  ? ?Intake/Output:  ? ?Intake/Output Summary (Last 24 hours) at 12/19/2021 1950 ?Last data filed at 12/19/2021 9326 ?Gross per 24 hour  ?Intake 341.9 ml  ?Output 1825 ml  ?Net -1483.1 ml  ?  ? ?Physical Exam  ?  ?General:   No resp difficulty ?HEENT: normal ?Neck: supple. JVP flat  . Carotids 2+ bilat; no bruits. No lymphadenopathy or thyromegaly appreciated. ?Cor: Mechanical heart sounds with LVAD hum present. ?Lungs: clear ?Abdomen: soft, nontender, nondistended. No hepatosplenomegaly. No bruits or masses. Good bowel sounds. ?Driveline: C/D/I; securement device intact and driveline incorporated ?Extremities: no cyanosis, clubbing, rash, edema. R Knee steri strips.  ?Neuro: alert & orientedx3, cranial nerves grossly intact. moves all 4 extremities w/o difficulty. Affect pleasant ? ? ?Telemetry  ? ?V paced 80  ? ?EKG  ?  ?N/A  ? ?Labs  ? ?Basic Metabolic Panel: ?Recent Labs  ?Lab 12/16/21 ?0956 12/18/21 ?1305  ?NA 135 134*  ?K 3.9 4.0  ?CL 103 100  ?CO2 25 24  ?GLUCOSE 111* 100*  ?BUN 36* 32*  ?CREATININE 2.39* 2.44*  ?CALCIUM 9.1 9.3  ? ? ?Liver Function Tests: ?Recent Labs  ?Lab 12/18/21 ?1305  ?AST 21  ?ALT 20  ?ALKPHOS 104  ?BILITOT 1.0  ?PROT 7.3  ?ALBUMIN 3.5  ? ?No results for input(s):  LIPASE, AMYLASE in the last 168 hours. ?No results for input(s): AMMONIA in the last 168 hours. ? ?CBC: ?Recent Labs  ?Lab 12/16/21 ?0956 12/18/21 ?1305 12/19/21 ?0703  ?WBC 10.6* 9.7 5.1  ?NEUTROABS  --  8.1*  --   ?HGB 10.4* 10.2* 8.2*  ?HCT 32.2* 31.3* 25.8*  ?MCV 93.9 94.0 93.8  ?PLT 399 406* 304  ? ? ?INR: ?Recent Labs  ?Lab 12/16/21 ?0956 12/18/21 ?1305 12/19/21 ?0703  ?INR 2.7* 2.5* 2.8*  ? ? ?Other results: ?EKG:  ? ? ?Imaging  ? ?CT HEAD WO CONTRAST (5MM) ? ?Result Date: 12/18/2021 ?CLINICAL DATA:  Head trauma, minor (Age >= 65y) EXAM: CT HEAD WITHOUT CONTRAST TECHNIQUE: Contiguous axial images were obtained from the base of the skull through the vertex without intravenous contrast. RADIATION DOSE REDUCTION: This exam was performed according to the departmental dose-optimization program which includes automated exposure control, adjustment of the mA and/or kV according to patient size and/or use of iterative reconstruction technique. COMPARISON:  CT head June 27, 2019. FINDINGS: Brain: No evidence of acute infarction, hemorrhage, hydrocephalus, extra-axial collection or mass lesion/mass effect. Mild cerebral atrophy. Partially empty sella. Vascular: No hyperdense vessel identified. Skull: No acute fracture. Sinuses/Orbits: Clear visualized sinuses. No acute orbital findings. Other: No sizable mastoid effusions. Cerumen in bilateral external auditory canals. IMPRESSION: No evidence of acute intracranial abnormality. Electronically Signed   By: Margaretha Sheffield  M.D.   On: 12/18/2021 14:14   ? ? ?Medications:   ? ? ?Scheduled Medications: ? aspirin EC  81 mg Oral q AM  ? Chlorhexidine Gluconate Cloth  6 each Topical Daily  ? citalopram  10 mg Oral Daily  ? levothyroxine  175 mcg Oral Q0600  ? mexiletine  150 mg Oral BID  ? midodrine  10 mg Oral TID WC  ? mupirocin ointment  1 application. Nasal BID  ? pantoprazole  40 mg Oral Daily  ? rosuvastatin  20 mg Oral Daily  ? sodium chloride flush  3 mL Intravenous  Q12H  ? warfarin  5 mg Oral q1600  ? Warfarin - Pharmacist Dosing Inpatient   Does not apply [id number]  ? ? ?Infusions: ? sodium chloride 75 mL/hr at 12/18/21 1924  ? sodium chloride    ? ? ?PRN Medications: ?sodium chloride, acetaminophen, melatonin, ondansetron (ZOFRAN) IV, sodium chloride flush, traMADol, traZODone ? ? ?Patient Profile  ? ?81 y.o. 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 Presenting with recurrent syncopal episodes ? ?Assessment/Plan:   ?Syncope: ?-3 episodes associated with fall in last 2 days.  ?-Known history of orthostatic hypotension. Has been on midodrine 5 mg TID.  ?-+ orthostatics in ED ?-Given IV fluids. Overall 3 liters. Check orthostatics.  ?-CT head negative. ending ?-Scr 2.44, near baseline (2-2.3), Hgb 10.2-->8.2  ?  ?2. Chronic systolic HF:  ?- ICM, EF 21-30% s/p LVAD HM II implant 08/2013 for DT.   ?- Echo 3/18 EF 10-15% mild AI. Mod RV dysfunction. LVAD cannula OK ?- has struggled with RV failure however RHC 11/22 looked good (despite evidence of RVF on LE dopplers showing reflux ?- Stable NYHA II-III ?- No diuretics. Looks ok today.  ?- Failed Jardiance due to volume depletion ?- He has not tolerated ACE-I or amlodipine or b-blocker in the past. ?  ?3. LVAD: HMII 08/2013 for DT. ?- VAD interrogated personally. Parameters stable. ?- Admitted in May 2018 with elevated LDH with bival.  ?- Continue aspirin and coumadin.  ?- Hgb 10.2-->8.2 ?- LDH 314>254 ?- DL site looks good ?- Continue warfarin INR 2.8 Goal 2.0-3.0. Dosing per PharmD ?  ?4. OA s/p R TKA 4/23 ?- stable post-op. R knee looks ok.  ?- pain well controlled ?  ?5. VT/Frequent PVCs ?- now s/p ICD gen change.  ?- Off amio due to cerebellar side effects.  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 Now on sotalol. QTc stable on ECG ?- Seen in ER 06/16/21 with recurrent VT. DC-CV x 1 ?- PVCs improved on mexilitene. Continue mexilitene 150 bid ?- Keep K > 4.0 Mg >  2.0 ?- Check Mag/K.  ?- No arrhythmias on device interrogation.  ?- Sotalol stopped.  ?  ?6. Atrial flutter, recurrent ?- patient paced out of AFL in 6/22. Atrial therapies enabled  ?- continue sotalol (on for VT). QTs slightly prolonged but stable ?- AC as above ?- may need to consider ablation if recurs ?- Remains in NSR today ?  ?7. Chronic anticoagulation: INR goal 2.0-3.0.    ?- INR 2.8 See above ?- Continue ASA 81 mg for LVAD + warfarin.  ?  ?8. HTN:  ?- Orthostatics today as above ?  ?9. Hypothyroidism:  ?- Followed by Dr Dwyane Dee. Stable. No change  ?  ?10. Hyperlipidemia:  ?- Continue Crestor. Zetia stopped due to cost.  ?- No change.  ?  ?11. CAD ?-  no s/s ischemia ?- continue Crestor ?  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  ?- failed Jardiance ?- Has outpatient Nephrology referral ? ?71. Hematuria ?UA with > 50 RBCs. Resolved this morning.  ?- Will need outpatient Urology follow up.  ?Watch closely.  ? ? ?Consult PT. Transfer to [facility].  ? ?I reviewed the LVAD parameters from today, and compared the results to the patient's prior recorded data.  No programming changes were made.  The LVAD is functioning within specified parameters.  The patient performs LVAD self-test daily.  LVAD interrogation was negative for any significant power changes, alarms or PI events/speed drops.  LVAD equipment check completed and is in good working order.  Back-up equipment present.   LVAD education done on emergency procedures and precautions and reviewed exit site care. ? ?Length of Stay: 1 ? ?Darrick Grinder, NP ?12/19/2021, 8:22 AM ? ?VAD Team --- VAD ISSUES ONLY--- ?Pager [phone] (7am - 7am) ? ?Advanced Heart Failure Team  ?Pager [phone] (M-F; 7a - 5p)  ?Please contact [facility] for night-coverage after hours (5p -7a ) and weekends on [email or web address] ? ?Patient seen with NP, agree with the above note.  ? ?He is not orthostatic today.  Stood up to check  orthostatics but has not walked yet.  ? ?General: Well appearing this am. NAD.  ?HEENT: Normal. ?Neck: Supple, JVP 7-8 cm. Carotids OK.  ?Cardiac:  Mechanical heart sounds with LVAD hum present.  ?Lungs:  CTA

## 2021-12-19 NOTE — Evaluation (Signed)
Physical Therapy Evaluation ?Patient Details ?Name: Johnny Oneill ?MRN: [id number] ?DOB: Jan 14, 1941 ?Today's Date: 12/19/2021 ? ?History of Present Illness ? Pt is a 81 y.o. male who presented 12/18/21 with syncopal episode in which pt hit his head. CT of head negative for acute intracranial abnormalities. ECG showed VT @ 240. Underwent emergent DC-CV in ER and ICD gen replaced. Of note, pt admitted 12/01/21 - 04/23/22 for R TKA, complicated by orthostatic hypotension. PMhx: CKD, CHF, HTN, LVAD HM II 2015, HLD, CAD, Lt TKA, ICD ?  ?Clinical Impression ? Pt presents with condition above and deficits mentioned below, see PT Problem List. Prior to his R TKA 12/03/21, pt was IND without DME. Since his TKA, he has been mod I - SUP with a RW. He has had a couple falls since his last admission secondary to lightheadedness. He lives with his wife in a 2-level house with 4 STE. Currently, pt is asymptomatic, see BP measures below. He was able to perform all functional mobility at a min guard assist level using a RW without LOB. He displays deficits in activity tolerance, balance, strength, and R knee ROM. Recommending pt continue with HHPT to address his deficits. Educated pt and his wife on using the rollator (provided he can control it safely) for mobility safety if he continues to get lightheaded to allow him a place to sit when needed. Will continue to follow acutely. ?  ? ?BP:  ?121/88 (93) sitting EOB ?108/84 (94) standing ?101/84 (91) standing ~3 min ?104/79 (89) while ambulating ?98/87 (93) sitting after gait bout ?117/100 (108) after sitting several minutes end of session ?  ? ?Recommendations for follow up therapy are one component of a multi-disciplinary discharge planning process, led by the attending physician.  Recommendations may be updated based on patient status, additional functional criteria and insurance authorization. ? ?Follow Up Recommendations Home health PT ? ?  ?Assistance Recommended at Discharge Frequent  or constant Supervision/Assistance  ?Patient can return home with the following ? A little help with walking and/or transfers;A little help with bathing/dressing/bathroom;Assistance with cooking/housework;Assist for transportation;Help with stairs or ramp for entrance ? ?  ?Equipment Recommendations None recommended by PT  ?Recommendations for Other Services ?    ?  ?Functional Status Assessment Patient has had a recent decline in their functional status and demonstrates the ability to make significant improvements in function in a reasonable and predictable amount of time.  ? ?  ?Precautions / Restrictions Precautions ?Precautions: Knee;Fall;Other (comment) ?Precaution Comments: LVAD, watch HR and BP, R TKA 12/03/21 ?Restrictions ?Weight Bearing Restrictions: No  ? ?  ? ?Mobility ? Bed Mobility ?  ?  ?  ?  ?  ?  ?  ?General bed mobility comments: Pt sitting EOB upon arrival. ?  ? ?Transfers ?Overall transfer level: Needs assistance ?Equipment used: Rolling walker (2 wheels) ?Transfers: Sit to/from Stand ?Sit to Stand: Min guard ?  ?  ?  ?  ?  ?General transfer comment: Min guard assist for safety, no LOB. Extra time to gain balance and adjust feet placement once standing with RW. ?  ? ?Ambulation/Gait ?Ambulation/Gait assistance: Min guard ?Gait Distance (Feet): 200 Feet ?Assistive device: Rolling walker (2 wheels) ?Gait Pattern/deviations: Decreased stride length, Decreased stance time - right, Decreased weight shift to right, Antalgic, Step-through pattern, Trunk flexed, Decreased step length - left ?Gait velocity: reduced ?Gait velocity interpretation: <1.8 ft/sec, indicate of risk for recurrent falls ?  ?General Gait Details: Pt with slowed, antalgic gait pattern with  decreased R stance time and thus L step length secondary to recent R TKA. No LOB or lightheadedness noted, min guard for safety. ? ?Stairs ?  ?  ?  ?  ?  ? ?Wheelchair Mobility ?  ? ?Modified Rankin (Stroke Patients Only) ?  ? ?  ? ?Balance Overall  balance assessment: Needs assistance ?Sitting-balance support: No upper extremity supported, Feet supported ?Sitting balance-Leahy Scale: Good ?Sitting balance - Comments: Able to reach above head with bil UE without LOB. ?  ?Standing balance support: Bilateral upper extremity supported, Single extremity supported, During functional activity ?Standing balance-Leahy Scale: Poor ?Standing balance comment: Reliant on RW ?  ?  ?  ?  ?  ?  ?  ?  ?  ?  ?  ?   ? ? ? ?Pertinent Vitals/Pain Pain Assessment ?Pain Assessment: Faces ?Faces Pain Scale: Hurts little more ?Pain Location: Rt knee ?Pain Descriptors / Indicators: Discomfort, Grimacing, Guarding ?Pain Intervention(s): Limited activity within patient's tolerance, Monitored during session, Repositioned  ? ? ?Home Living Family/patient expects to be discharged to:: Private residence ?Living Arrangements: Spouse/significant other ?Available Help at Discharge: Family;Available 24 hours/day ?Type of Home: House ?Home Access: Stairs to enter ?Entrance Stairs-Rails: None ?Entrance Stairs-Number of Steps: 4 ?Alternate Level Stairs-Number of Steps: Walk in shower on the 2nd level.  Bathtub only on the main level. ?Home Layout: Two level;Able to live on main level with bedroom/bathroom ?Home Equipment: Air cabin crew (4 wheels);BSC/3in1;Rolling Walker (2 wheels) ?   ?  ?Prior Function Prior Level of Function : Independent/Modified Independent ?  ?  ?  ?  ?  ?  ?Mobility Comments: Prior to R TKA 12/03/21 pt was IND. Since TKA he has been mod I-SUP with RW. Pt with a couple falls since last admission secondary to lightheadedness. ?ADLs Comments: No assist for ADL/IADL prior to TKA ?  ? ? ?Hand Dominance  ? Dominant Hand: Right ? ?  ?Extremity/Trunk Assessment  ? Upper Extremity Assessment ?Upper Extremity Assessment: Overall WFL for tasks assessed ?  ? ?Lower Extremity Assessment ?Lower Extremity Assessment: RLE deficits/detail ?RLE Deficits / Details: Noted limited knee  flexion AROM, achieving ~70 degrees flexion, s/p R TKA 12/03/21; denied numbness/tingling bil ?  ? ?Cervical / Trunk Assessment ?Cervical / Trunk Assessment: Normal  ?Communication  ? Communication: No difficulties  ?Cognition Arousal/Alertness: Awake/alert ?Behavior During Therapy: [facility] for tasks assessed/performed ?Overall Cognitive Status: Within Functional Limits for tasks assessed ?  ?  ?  ?  ?  ?  ?  ?  ?  ?  ?  ?  ?  ?  ?  ?  ?  ?  ?  ? ?  ?General Comments General comments (skin integrity, edema, etc.): BP 121/88 (93) sitting EOB, 108/84 (94) standing, 101/84 (91) standing ~3 min, 104/79 (89) while ambulating, 98/87 (93) sitting after gait bout, 117/100 (108) after sitting several minutes end of session; reported no lightheadedness throughout session; noted anchor for LVAD was being pulled on and started to peel off pt, notified pt and wife need to replace it and support LVAD to avoid pulling ? ?  ?Exercises    ? ?Assessment/Plan  ?  ?PT Assessment Patient needs continued PT services  ?PT Problem List Decreased strength;Decreased range of motion;Decreased mobility;Decreased activity tolerance;Decreased balance;Decreased knowledge of use of DME;Pain;Cardiopulmonary status limiting activity ? ?   ?  ?PT Treatment Interventions DME instruction;Therapeutic exercise;Gait training;Stair training;Functional mobility training;Therapeutic activities;Patient/family education;Balance training;Neuromuscular re-education   ? ?PT Goals (Current goals can  be found in the Care Plan section)  ?Acute Rehab PT Goals ?Patient Stated Goal: to go home ?PT Goal Formulation: With patient/family ?Time For Goal Achievement: 01/02/22 ?Potential to Achieve Goals: Good ? ?  ?Frequency 7X/week ?  ? ? ?Co-evaluation   ?  ?  ?  ?  ? ? ?  ?AM-PAC PT "6 Clicks" Mobility  ?Outcome Measure Help needed turning from your back to your side while in a flat bed without using bedrails?: None ?Help needed moving from lying on your back to sitting on the  side of a flat bed without using bedrails?: A Little ?Help needed moving to and from a bed to a chair (including a wheelchair)?: A Little ?Help needed standing up from a chair using your arms (e.g., wheel

## 2021-12-19 NOTE — Progress Notes (Signed)
LVAD Coordinator Rounding Note: ? ?Admitted 12/18/21 from ED to Dr. Haroldine Laws service for recurrent syncopal eqisodes with two falls.  ? 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 Darcey [name] under bridge to transplant criteria. Patient was removed from transplant list 05/2016 per his choice. He is currently Destination Therapy LVAD.  ? ?Patient laying in bed this morning. Says he feels "stronger" since eating breakfast. Denies complaints. ? ?Bedside echo completed. ? ?Orthostatics including VAD parameters obtained - see below. Patient sat up independently then stood with assistance of walker. He denied any dizziness or lightheadedness.  ? ? ?Vital signs   Lying:            Sitting:       Standing:      ?Temp: 98.2 ?HR:    82            88         83  ?Doppler Pressure: 112     ?Automatic BP:  126/106 (114)     120/109 (115)    122/95 (105)    ?O2 Sat: 99% on RA ?Wt:  157.6 lbs ? ?LVAD interrogation reveals:  ?Speed:    9600 ?Flow:    4.2       4.9        4.3 ?Power:   5.4w ?PI:    6.9        6.5        7.3 ? ?Alarms: none ?Events: 32 PI events yesterday ? ?Fixed speed:  9600 ?Low speed limit: 9000 ? ?Drive Line:  Drive line is being maintained weekly by [name], his wife. Existing VAD dressing C/D/I Sorbaview dressing and Silverlon patch intact. Exit site healed and incorporated, the velour is fully implanted at exit site. Pt denies fever or chills. Weekly dressing changes per [facility] nurse or caregiver. Next dressing change due 12/23/21. ? ?Labs:  ?LDH trend: 314>254 ? ?INR trend:  2.5>2.8 ? ?Anticoagulation Plan: ?-INR Goal:  2.5 - 3.0 ?-ASA Dose: 81 mg daily ? ? ?Device: [employer] dual ?- Therapies: on 231 bpm ?- Pacing:  AAI<=>DDD 80 bpm ?- Last check: in ED per [employer] rep on 12/18/21 - rate response turned on ? ? ?Plan/Recommendations:  ?Call VAD Coordinator with any VAD equipment or drive line issues. ?Weekly dressing changes per [facility] nurse or caregiver.  ? ?Zada Girt RN ?VAD Coordinator  ?Office: [phone]  ?24/7  Pager: [phone]  ? ? ? ? ? ? ? ? ? ?

## 2021-12-19 NOTE — Discharge Summary (Signed)
?Advanced Heart Failure Team ? ?Discharge Summary  ? ?Patient ID: Johnny Oneill ?MRN: [id number], DOB/AGE: 11/10/1940 81 y.o. Admit date: 12/18/2021 ?D/C date:     12/19/2021  ? ?Primary Discharge Diagnoses:  ?Syncope: ?2. Chronic systolic HF:  ?3. LVAD: HMII 08/2013 for DT. ?4. 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 VT/Frequent PVCs ?6. Atrial flutter, recurrent ?7. Chronic anticoagulation: INR goal 2.0-3.0.    ? 8. HTN:  ?9. Hypothyroidism:  ?10. Hyperlipidemia:  ?11. 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 Hematuria ? ? ?Hospital Course:   ?81 y.o. 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 Presenting with recurrent syncopal episodes. Poor po intake for the last few days. Suspect orthostatic. CT head negative. Given 3 liters IV fluids.  No arrhythmias on interrogation.  ? ?Improved with fluids. Midodrine increased to 15 mg three times a day. Able to ambulate with a walker. Dr Aundra Dubin assessed and deemed appropriate for discharge.  ? ?VAD pararmeters remained stable. INR therapeutic. See below for detailed problems list. He will contine to be followed closely in the VAD clinic. ? ? ?1. Syncope: ?-3 episodes associated with fall in last 2 days.  ?-Known history of orthostatic hypotension. Has been on midodrine 5 mg TID.  ?-+ orthostatics in ED. ?-Given IV fluids. Overall 3 liters. Improved with fluids.   ?-CT head negative.  ?-Scr 2.44, near baseline (2-2.3), Hgb 10.2-->8.2  ?  ?2. Chronic systolic HF:  ?- ICM, EF 75-10% s/p LVAD HM II implant 08/2013 for DT.   ?- Echo 3/18 EF 10-15% mild AI. Mod RV dysfunction. LVAD cannula OK ?- has struggled with RV failure however RHC 11/22 looked good (despite evidence of RVF on LE dopplers showing reflux ?- Stable NYHA II-III. Improved with IV fluids.  ?- No diuretics. Looks ok today.  ?- Failed Jardiance due to volume depletion ?- He has not tolerated ACE-I or amlodipine or b-blocker in the past. ?  ?3. LVAD: HMII 08/2013 for  DT. ?- VAD interrogated personally. Parameters stable. ?- Admitted in May 2018 with elevated LDH with bival.  ?- Continue aspirin and coumadin.  ?- Hgb 10.2-->8.2 ?- LDH 314>254 ?- DL site looks good ?- Continue warfarin INR 2.8 Goal 2.0-3.0. Dosing per PharmD ?  ?4. OA s/p R TKA 4/23 ?- stable post-op. R knee looks ok.  ?- pain well controlled ?  ?5. VT/Frequent PVCs ?- now s/p ICD gen change.  ?- Off amio due to cerebellar side effects.  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 Now on sotalol. QTc stable on ECG ?- Seen in ER 06/16/21 with recurrent VT. DC-CV x 1 ?- PVCs improved on mexilitene. Continue mexilitene 150 bid ?- Keep K > 4.0 Mg > 2.0 ?- Check Mag/K.  ?- No arrhythmias on device interrogation.  ?- Sotalol stopped.  ?  ?6. Atrial flutter, recurrent ?- patient paced out of AFL in 6/22. Atrial therapies enabled  ?- continue sotalol (on for VT). QTs slightly prolonged but stable ?- AC as above ?- may need to consider ablation if recurs ?- Remains in NSR today ?  ?7. Chronic anticoagulation: INR goal 2.0-3.0.    ?- INR 2.8 See above ?- Continue ASA 81 mg for LVAD + warfarin.  ?  ?8. HTN:  ?- Orthostatics today as above ?  ?9. Hypothyroidism:  ?- Followed by Dr Dwyane Dee. Stable. No change  ?  ?10. Hyperlipidemia:  ?- Continue Crestor. Zetia stopped due to cost.  ?-  No change.  ?  ?11. CAD ?- no s/s ischemia ?- continue Crestor ?  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  ?- failed Jardiance ?- Has outpatient Nephrology referral ?  ?38. Hematuria ?UA with > 50 RBCs. Resolved this morning.  ?- Will need outpatient Urology follow up.  ?Watch closely.  ? ? ?LVAD Interrogation HM II:   Speed:  9600   Flow:  4   PI:   7   Power:  5    Back-up speed:    9600  ? ?Discharge Vitals: Blood pressure (!) 126/106, pulse 85, temperature 98.2 ?F (36.8 ?C), temperature source Oral, resp. rate (!) 21, weight 71.5 kg, SpO2 (!) 86 %. ? ?Labs: ?Lab Results  ?Component Value Date  ?  WBC 5.1 12/19/2021  ? HGB 8.2 (L) 12/19/2021  ? HCT 25.8 (L) 12/19/2021  ? MCV 93.8 12/19/2021  ? PLT 304 12/19/2021  ?  ?Recent Labs  ?Lab 12/18/21 ?1305 12/19/21 ?0703  ?NA 134* 139  ?K 4.0 4.3  ?CL 100 109  ?CO2 24 20*  ?BUN 32* 27*  ?CREATININE 2.44* 2.11*  ?CALCIUM 9.3 8.6*  ?PROT 7.3  --   ?BILITOT 1.0  --   ?ALKPHOS 104  --   ?ALT 20  --   ?AST 21  --   ?GLUCOSE 100* 91  ? ?Lab Results  ?Component Value Date  ? CHOL 93 05/28/2015  ? HDL 41.50 05/28/2015  ? [place] 29 05/28/2015  ? TRIG 112.0 05/28/2015  ? ?BNP (last 3 results) ?No results for input(s): BNP in the last 8760 hours. ? ?ProBNP (last 3 results) ?No results for input(s): PROBNP in the last 8760 hours. ? ? ?Diagnostic Studies/Procedures  ? ?CT HEAD WO CONTRAST (5MM) ? ?Result Date: 12/18/2021 ?CLINICAL DATA:  Head trauma, minor (Age >= 65y) EXAM: CT HEAD WITHOUT CONTRAST TECHNIQUE: Contiguous axial images were obtained from the base of the skull through the vertex without intravenous contrast. RADIATION DOSE REDUCTION: This exam was performed according to the departmental dose-optimization program which includes automated exposure control, adjustment of the mA and/or kV according to patient size and/or use of iterative reconstruction technique. COMPARISON:  CT head June 27, 2019. FINDINGS: Brain: No evidence of acute infarction, hemorrhage, hydrocephalus, extra-axial collection or mass lesion/mass effect. Mild cerebral atrophy. Partially empty sella. Vascular: No hyperdense vessel identified. Skull: No acute fracture. Sinuses/Orbits: Clear visualized sinuses. No acute orbital findings. Other: No sizable mastoid effusions. Cerumen in bilateral external auditory canals. IMPRESSION: No evidence of acute intracranial abnormality. Electronically Signed   By: Margaretha Sheffield M.D.   On: 12/18/2021 14:14  ? ?ECHOCARDIOGRAM COMPLETE ? ?Result Date: 12/19/2021 ?   ECHOCARDIOGRAM REPORT   Patient Name:   Johnny Oneill Date of Exam: 12/19/2021 Medical Rec #:   [id number]     Height:       67.0 in Accession #:    [id number]    Weight:       157.6 lb Date of Birth:  08-08-1941    BSA:          1.828 m? Patient Age:    82 years      BP:           0/0 mmHg Patient Gender: M             HR:           81 bpm. Exam Location:  Inpatient Procedure: 2D  Echo, Cardiac Doppler, Color Doppler and Intracardiac            Opacification Agent Indications:    Syncope  History:        Patient has prior history of Echocardiogram examinations, most                 recent 01/03/2021. CAD; Pacemaker. Orthostatic hypotension. LVAD                 HeartMate II @ 0347 RPM.  Sonographer:    Clayton Lefort RDCS (AE) Referring Phys: [id number] DANIEL R BENSIMHON IMPRESSIONS  1. LVAD cannula noted at LV apex.  2. Left ventricular ejection fraction, by estimation, is 20 to 25%. The left ventricle has severely decreased function. The left ventricle demonstrates global hypokinesis. The left ventricular internal cavity size was mildly dilated. There is moderate left ventricular hypertrophy. Left ventricular diastolic parameters are indeterminate.  3. Right ventricular systolic function is moderately reduced. The right ventricular size is not well visualized. Tricuspid regurgitation signal is inadequate for assessing PA pressure.  4. The mitral valve is normal in structure. Mild to moderate mitral valve regurgitation. No evidence of mitral stenosis.  5. The aortic valve has an indeterminant number of cusps. Aortic valve regurgitation is mild.  6. The inferior vena cava is normal in size with greater than 50% respiratory variability, suggesting right atrial pressure of 3 mmHg. FINDINGS  Left Ventricle: Left ventricular ejection fraction, by estimation, is 20 to 25%. The left ventricle has severely decreased function. The left ventricle demonstrates global hypokinesis. Definity contrast agent was given IV to delineate the left ventricular endocardial borders. The left ventricular internal cavity size was mildly dilated.  There is moderate left ventricular hypertrophy. Left ventricular diastolic parameters are indeterminate. Right Ventricle: The right ventricular size is not well visualized. Right vetricular wall thickne

## 2021-12-19 NOTE — Care Management CC44 (Signed)
Condition Code 44 Documentation Completed ? ?Patient Details  ?Name: Johnny Oneill ?MRN: [id number] ?Date of Birth: 1940/12/26 ? ? ?Condition Code 44 given:  Yes ?Patient signature on Condition Code 44 notice:  Yes ?Documentation of 2 MD's agreement:  Yes ?Code 44 added to claim:    ? ? ? ?Verdell Carmine, RN ?12/19/2021, 2:45 PM ? ?

## 2021-12-19 NOTE — Care Management Obs Status (Signed)
MEDICARE OBSERVATION STATUS NOTIFICATION ? ? ?Patient Details  ?Name: Johnny Oneill ?MRN: [id number] ?Date of Birth: August 14, 1941 ? ? ?Medicare Observation Status Notification Given:  Yes ? ? ? ?Verdell Carmine, RN ?12/19/2021, 2:45 PM ?

## 2021-12-19 NOTE — Progress Notes (Signed)
ANTICOAGULATION CONSULT NOTE ?Pharmacy Consult for warfarin  ?Indication: LVAD ? ?Allergies  ?Allergen Reactions  ? Ace Inhibitors   ?  Hypotension and elevated creatinine  ? Amiodarone Other (See Comments)  ?  Cerebellar side effects  ? Diovan [Valsartan] Other (See Comments)  ?  Hypotension at low dose, hyperkalemia  ? Lipitor [Atorvastatin Calcium] Other (See Comments)  ?  Muscle pain  ? Jardiance [Empagliflozin] Other (See Comments)  ?  Unsure of exact side effect  ? Norvasc [Amlodipine] Other (See Comments)  ?  Put patient to sleep  ? ? ?Patient Measurements: ?Weight: 71.5 kg (157 lb 10.1 oz) ? Heparin dosing weight: 71.1 kg ? ?Vital Signs: ?Temp: 98.2 ?F (36.8 ?C) (04/21 0749) ?Temp Source: Oral (04/21 0749) ?BP: 116/93 (04/21 0800) ?Pulse Rate: 80 (04/21 0800) ? ?Labs: ?Recent Labs  ?  12/16/21 ?0956 12/18/21 ?1305 12/19/21 ?0703  ?HGB 10.4* 10.2* 8.2*  ?HCT 32.2* 31.3* 25.8*  ?PLT 399 406* 304  ?LABPROT 28.5* 27.2* 29.5*  ?INR 2.7* 2.5* 2.8*  ?CREATININE 2.39* 2.44* 2.11*  ? ? ? ?Estimated Creatinine Clearance: 27 mL/min (A) (by C-G formula based on SCr of 2.11 mg/dL (H)). ? ? 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 Pharmacy consulted to dose warfarin.  ? ?INR within goal at 2.8 this am. Hgb down from 10 to 8.2 possibly dilutional after 3l of fluid. LDH stable low 300s. Hematuria noted yesterday, urine clear this am ? ?PTA warfarin dose: 5 mg daily ? 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  ?Plan:  ?Continue home dose 5mg  daily ?Daily INR ? ?Erin Hearing PharmD., BCPS ?Clinical Pharmacist ?12/19/2021 9:23 AM ? ?

## 2021-12-21 DIAGNOSIS — F419 Anxiety disorder, unspecified: Secondary | ICD-10-CM | POA: Diagnosis not present

## 2021-12-21 DIAGNOSIS — I5022 Chronic systolic (congestive) heart failure: Secondary | ICD-10-CM | POA: Diagnosis not present

## 2021-12-21 DIAGNOSIS — I130 Hypertensive heart and chronic kidney disease with heart failure and stage 1 through stage 4 chronic kidney disease, or unspecified chronic kidney disease: Secondary | ICD-10-CM | POA: Diagnosis not present

## 2021-12-21 DIAGNOSIS — Z471 Aftercare following joint replacement surgery: Secondary | ICD-10-CM | POA: Diagnosis not present

## 2021-12-21 DIAGNOSIS — I2510 Atherosclerotic heart disease of native coronary artery without angina pectoris: Secondary | ICD-10-CM | POA: Diagnosis not present

## 2021-12-21 DIAGNOSIS — Z96653 Presence of artificial knee joint, bilateral: Secondary | ICD-10-CM | POA: Diagnosis not present

## 2021-12-22 DIAGNOSIS — I2510 Atherosclerotic heart disease of native coronary artery without angina pectoris: Secondary | ICD-10-CM | POA: Diagnosis not present

## 2021-12-22 DIAGNOSIS — F419 Anxiety disorder, unspecified: Secondary | ICD-10-CM | POA: Diagnosis not present

## 2021-12-22 DIAGNOSIS — I5022 Chronic systolic (congestive) heart failure: Secondary | ICD-10-CM | POA: Diagnosis not present

## 2021-12-22 DIAGNOSIS — Z96653 Presence of artificial knee joint, bilateral: Secondary | ICD-10-CM | POA: Diagnosis not present

## 2021-12-22 DIAGNOSIS — Z471 Aftercare following joint replacement surgery: Secondary | ICD-10-CM | POA: Diagnosis not present

## 2021-12-22 DIAGNOSIS — I130 Hypertensive heart and chronic kidney disease with heart failure and stage 1 through stage 4 chronic kidney disease, or unspecified chronic kidney disease: Secondary | ICD-10-CM | POA: Diagnosis not present

## 2021-12-24 ENCOUNTER — Telehealth (HOSPITAL_COMMUNITY): Payer: Self-pay | Admitting: Unknown Physician Specialty

## 2021-12-24 ENCOUNTER — Other Ambulatory Visit (HOSPITAL_COMMUNITY): Payer: Self-pay

## 2021-12-24 DIAGNOSIS — I130 Hypertensive heart and chronic kidney disease with heart failure and stage 1 through stage 4 chronic kidney disease, or unspecified chronic kidney disease: Secondary | ICD-10-CM | POA: Diagnosis not present

## 2021-12-24 DIAGNOSIS — I5022 Chronic systolic (congestive) heart failure: Secondary | ICD-10-CM | POA: Diagnosis not present

## 2021-12-24 DIAGNOSIS — Z471 Aftercare following joint replacement surgery: Secondary | ICD-10-CM | POA: Diagnosis not present

## 2021-12-24 DIAGNOSIS — F419 Anxiety disorder, unspecified: Secondary | ICD-10-CM | POA: Diagnosis not present

## 2021-12-24 DIAGNOSIS — Z96653 Presence of artificial knee joint, bilateral: Secondary | ICD-10-CM | POA: Diagnosis not present

## 2021-12-24 DIAGNOSIS — I2510 Atherosclerotic heart disease of native coronary artery without angina pectoris: Secondary | ICD-10-CM | POA: Diagnosis not present

## 2021-12-24 MED ORDER — MIDODRINE HCL 10 MG PO TABS: 15.0000 mg | ORAL_TABLET | Freq: Three times a day (TID) | ORAL | 3 refills | Status: DC

## 2021-12-24 NOTE — Telephone Encounter (Signed)
Pts wife called stating that midodrine was $216 at [facility], she would like script to be sent to [facility] as it is only $45 there. I have sent script over to the [facility] in [place]. ? ?Tanda Rockers RN, BSN ?VAD Coordinator ?24/7 Pager [phone] ? ?

## 2021-12-25 ENCOUNTER — Telehealth: Payer: Self-pay

## 2021-12-25 ENCOUNTER — Telehealth (HOSPITAL_COMMUNITY): Payer: Self-pay | Admitting: *Deleted

## 2021-12-25 ENCOUNTER — Ambulatory Visit (INDEPENDENT_AMBULATORY_CARE_PROVIDER_SITE_OTHER): Payer: Medicare Other

## 2021-12-25 DIAGNOSIS — I472 Ventricular tachycardia, unspecified: Secondary | ICD-10-CM

## 2021-12-25 LAB — CUP PACEART REMOTE DEVICE CHECK
Battery Remaining Longevity: 63 mo
Battery Voltage: 2.97 V
Brady Statistic AP VP Percent: 79.99 %
Brady Statistic AP VS Percent: 2.44 %
Brady Statistic AS VP Percent: 10.57 %
Brady Statistic AS VS Percent: 7 %
Brady Statistic RA Percent Paced: 81.16 %
Brady Statistic RV Percent Paced: 89.62 %
Date Time Interrogation Session: 20230427022603
HighPow Impedance: 38 Ohm
HighPow Impedance: 46 Ohm
Implantable Lead Implant Date: 20091103
Implantable Lead Implant Date: 20091103
Implantable Lead Location: 753859
Implantable Lead Location: 753860
Implantable Lead Model: 5076
Implantable Lead Model: 6947
Implantable Pulse Generator Implant Date: 20201028
Lead Channel Impedance Value: 342 Ohm
Lead Channel Impedance Value: 361 Ohm
Lead Channel Impedance Value: 361 Ohm
Lead Channel Pacing Threshold Amplitude: 0.5 V
Lead Channel Pacing Threshold Amplitude: 1.25 V
Lead Channel Pacing Threshold Pulse Width: 0.4 ms
Lead Channel Pacing Threshold Pulse Width: 0.4 ms
Lead Channel Sensing Intrinsic Amplitude: 1.75 mV
Lead Channel Sensing Intrinsic Amplitude: 1.75 mV
Lead Channel Sensing Intrinsic Amplitude: 16 mV
Lead Channel Sensing Intrinsic Amplitude: 16 mV
Lead Channel Setting Pacing Amplitude: 1.5 V
Lead Channel Setting Pacing Amplitude: 2.75 V
Lead Channel Setting Pacing Pulse Width: 0.6 ms
Lead Channel Setting Sensing Sensitivity: 0.3 mV

## 2021-12-25 NOTE — Telephone Encounter (Signed)
Patient called in stating that he has sent in the transmission and would like a nurse to call him back to discuss ?

## 2021-12-25 NOTE — Telephone Encounter (Signed)
Returning patient's call. Confirmed receipt of manual transmission. Presenting rhythm AS/VP 92. Normal device function. All questions answered. Will continue to monitor.  ? ? ? ?

## 2021-12-25 NOTE — Telephone Encounter (Signed)
Scheduled remote reviewed. Normal device function.   ?There were 13 atrial arrhythmias detected that all required 1-5 bursts of ATP, the ATP does appear to be successful. ?Presenting EGM shows an atrial tachycardia at 158 bpm, sent to triage ?Next remote 91 days. ? ?Successful telephone encounter to patient to assess for possible ongoing AT. Patient confirms he continues to take mexiletine 150mg  BID. Patient will send manual transmission when he is done with lunch.  ? ? ?

## 2021-12-25 NOTE — Telephone Encounter (Signed)
Wife called VAD office to request we call [facility] regarding Rx that was sent for Midodrine 15 mg tid due to this being considered "a high dose". ? ?VAD Coordinator called and spoke with [facility] pharmacist, answered questions, and Rx was approved. Drusilla Kanner with update. ? ?Zada Girt RN, VAD Coordinator ?[phone] ?

## 2021-12-26 DIAGNOSIS — I5022 Chronic systolic (congestive) heart failure: Secondary | ICD-10-CM | POA: Diagnosis not present

## 2021-12-26 DIAGNOSIS — I2510 Atherosclerotic heart disease of native coronary artery without angina pectoris: Secondary | ICD-10-CM | POA: Diagnosis not present

## 2021-12-26 DIAGNOSIS — Z471 Aftercare following joint replacement surgery: Secondary | ICD-10-CM | POA: Diagnosis not present

## 2021-12-26 DIAGNOSIS — Z96653 Presence of artificial knee joint, bilateral: Secondary | ICD-10-CM | POA: Diagnosis not present

## 2021-12-26 DIAGNOSIS — F419 Anxiety disorder, unspecified: Secondary | ICD-10-CM | POA: Diagnosis not present

## 2021-12-26 DIAGNOSIS — I130 Hypertensive heart and chronic kidney disease with heart failure and stage 1 through stage 4 chronic kidney disease, or unspecified chronic kidney disease: Secondary | ICD-10-CM | POA: Diagnosis not present

## 2021-12-28 ENCOUNTER — Encounter (HOSPITAL_COMMUNITY): Payer: Self-pay

## 2021-12-28 ENCOUNTER — Other Ambulatory Visit: Payer: Self-pay

## 2021-12-28 ENCOUNTER — Inpatient Hospital Stay (HOSPITAL_COMMUNITY)
Admission: EM | Admit: 2021-12-28 | Discharge: 2022-01-02 | DRG: 309 | Disposition: A | Discharge status: Home Health | Payer: Medicare Other | Attending: Internal Medicine | Admitting: Internal Medicine

## 2021-12-28 DIAGNOSIS — I472 Ventricular tachycardia, unspecified: Secondary | ICD-10-CM | POA: Diagnosis not present

## 2021-12-28 DIAGNOSIS — Z79899 Other long term (current) drug therapy: Secondary | ICD-10-CM | POA: Diagnosis not present

## 2021-12-28 DIAGNOSIS — N184 Chronic kidney disease, stage 4 (severe): Secondary | ICD-10-CM | POA: Diagnosis not present

## 2021-12-28 DIAGNOSIS — I2510 Atherosclerotic heart disease of native coronary artery without angina pectoris: Secondary | ICD-10-CM | POA: Diagnosis not present

## 2021-12-28 DIAGNOSIS — Z888 Allergy status to other drugs, medicaments and biological substances status: Secondary | ICD-10-CM | POA: Diagnosis not present

## 2021-12-28 DIAGNOSIS — M1990 Unspecified osteoarthritis, unspecified site: Secondary | ICD-10-CM | POA: Diagnosis present

## 2021-12-28 DIAGNOSIS — I5082 Biventricular heart failure: Secondary | ICD-10-CM | POA: Diagnosis present

## 2021-12-28 DIAGNOSIS — Z955 Presence of coronary angioplasty implant and graft: Secondary | ICD-10-CM

## 2021-12-28 DIAGNOSIS — Z7901 Long term (current) use of anticoagulants: Secondary | ICD-10-CM | POA: Diagnosis not present

## 2021-12-28 DIAGNOSIS — I4892 Unspecified atrial flutter: Secondary | ICD-10-CM | POA: Diagnosis present

## 2021-12-28 DIAGNOSIS — I959 Hypotension, unspecified: Secondary | ICD-10-CM | POA: Diagnosis not present

## 2021-12-28 DIAGNOSIS — F419 Anxiety disorder, unspecified: Secondary | ICD-10-CM | POA: Diagnosis present

## 2021-12-28 DIAGNOSIS — I5022 Chronic systolic (congestive) heart failure: Secondary | ICD-10-CM | POA: Diagnosis present

## 2021-12-28 DIAGNOSIS — E869 Volume depletion, unspecified: Secondary | ICD-10-CM | POA: Diagnosis present

## 2021-12-28 DIAGNOSIS — I255 Ischemic cardiomyopathy: Secondary | ICD-10-CM | POA: Diagnosis present

## 2021-12-28 DIAGNOSIS — I2582 Chronic total occlusion of coronary artery: Secondary | ICD-10-CM | POA: Diagnosis present

## 2021-12-28 DIAGNOSIS — Z95810 Presence of automatic (implantable) cardiac defibrillator: Secondary | ICD-10-CM

## 2021-12-28 DIAGNOSIS — R42 Dizziness and giddiness: Secondary | ICD-10-CM | POA: Diagnosis not present

## 2021-12-28 DIAGNOSIS — I951 Orthostatic hypotension: Secondary | ICD-10-CM | POA: Diagnosis not present

## 2021-12-28 DIAGNOSIS — Z7982 Long term (current) use of aspirin: Secondary | ICD-10-CM

## 2021-12-28 DIAGNOSIS — M109 Gout, unspecified: Secondary | ICD-10-CM | POA: Diagnosis not present

## 2021-12-28 DIAGNOSIS — I493 Ventricular premature depolarization: Secondary | ICD-10-CM | POA: Diagnosis present

## 2021-12-28 DIAGNOSIS — Z95811 Presence of heart assist device: Secondary | ICD-10-CM | POA: Diagnosis not present

## 2021-12-28 DIAGNOSIS — E7800 Pure hypercholesterolemia, unspecified: Secondary | ICD-10-CM | POA: Diagnosis present

## 2021-12-28 DIAGNOSIS — R000 Tachycardia, unspecified: Secondary | ICD-10-CM | POA: Diagnosis not present

## 2021-12-28 DIAGNOSIS — Z96653 Presence of artificial knee joint, bilateral: Secondary | ICD-10-CM | POA: Diagnosis present

## 2021-12-28 DIAGNOSIS — I252 Old myocardial infarction: Secondary | ICD-10-CM

## 2021-12-28 DIAGNOSIS — I130 Hypertensive heart and chronic kidney disease with heart failure and stage 1 through stage 4 chronic kidney disease, or unspecified chronic kidney disease: Secondary | ICD-10-CM | POA: Diagnosis not present

## 2021-12-28 DIAGNOSIS — R531 Weakness: Secondary | ICD-10-CM | POA: Diagnosis not present

## 2021-12-28 DIAGNOSIS — Z8349 Family history of other endocrine, nutritional and metabolic diseases: Secondary | ICD-10-CM

## 2021-12-28 DIAGNOSIS — Z79890 Hormone replacement therapy (postmenopausal): Secondary | ICD-10-CM

## 2021-12-28 DIAGNOSIS — R0902 Hypoxemia: Secondary | ICD-10-CM | POA: Diagnosis not present

## 2021-12-28 DIAGNOSIS — Z8249 Family history of ischemic heart disease and other diseases of the circulatory system: Secondary | ICD-10-CM

## 2021-12-28 DIAGNOSIS — R5381 Other malaise: Secondary | ICD-10-CM | POA: Diagnosis present

## 2021-12-28 DIAGNOSIS — Z823 Family history of stroke: Secondary | ICD-10-CM

## 2021-12-28 DIAGNOSIS — E039 Hypothyroidism, unspecified: Secondary | ICD-10-CM | POA: Diagnosis not present

## 2021-12-28 DIAGNOSIS — N183 Chronic kidney disease, stage 3 unspecified: Secondary | ICD-10-CM | POA: Diagnosis present

## 2021-12-28 LAB — COMPREHENSIVE METABOLIC PANEL
ALT: 12 U/L (ref 0–44)
AST: 22 U/L (ref 15–41)
Albumin: 3.2 g/dL — ABNORMAL LOW (ref 3.5–5.0)
Alkaline Phosphatase: 98 U/L (ref 38–126)
Anion gap: 9 (ref 5–15)
BUN: 25 mg/dL — ABNORMAL HIGH (ref 8–23)
CO2: 24 mmol/L (ref 22–32)
Calcium: 8.8 mg/dL — ABNORMAL LOW (ref 8.9–10.3)
Chloride: 103 mmol/L (ref 98–111)
Creatinine, Ser: 2.43 mg/dL — ABNORMAL HIGH (ref 0.61–1.24)
GFR, Estimated: 27 mL/min — ABNORMAL LOW (ref >60)
Glucose, Bld: 122 mg/dL — ABNORMAL HIGH (ref 70–99)
Potassium: 4.1 mmol/L (ref 3.5–5.1)
Sodium: 136 mmol/L (ref 135–145)
Total Bilirubin: 0.9 mg/dL (ref 0.3–1.2)
Total Protein: 7 g/dL (ref 6.5–8.1)

## 2021-12-28 LAB — CBC WITH DIFFERENTIAL/PLATELET
Abs Immature Granulocytes: 0.03 10*3/uL (ref 0.00–0.07)
Basophils Absolute: 0 10*3/uL (ref 0.0–0.1)
Basophils Relative: 0 %
Eosinophils Absolute: 0.2 10*3/uL (ref 0.0–0.5)
Eosinophils Relative: 2 %
HCT: 29.3 % — ABNORMAL LOW (ref 39.0–52.0)
Hemoglobin: 9.1 g/dL — ABNORMAL LOW (ref 13.0–17.0)
Immature Granulocytes: 0 %
Lymphocytes Relative: 12 %
Lymphs Abs: 0.9 10*3/uL (ref 0.7–4.0)
MCH: 29.7 pg (ref 26.0–34.0)
MCHC: 31.1 g/dL (ref 30.0–36.0)
MCV: 95.8 fL (ref 80.0–100.0)
Monocytes Absolute: 0.8 10*3/uL (ref 0.1–1.0)
Monocytes Relative: 11 %
Neutro Abs: 5.4 10*3/uL (ref 1.7–7.7)
Neutrophils Relative %: 75 %
Platelets: 226 10*3/uL (ref 150–400)
RBC: 3.06 MIL/uL — ABNORMAL LOW (ref 4.22–5.81)
RDW: 15.1 % (ref 11.5–15.5)
WBC: 7.2 10*3/uL (ref 4.0–10.5)
nRBC: 0 % (ref 0.0–0.2)

## 2021-12-28 LAB — URIC ACID: Uric Acid, Serum: 6.7 mg/dL (ref 3.7–8.6)

## 2021-12-28 LAB — MAGNESIUM: Magnesium: 1.8 mg/dL (ref 1.7–2.4)

## 2021-12-28 LAB — LACTATE DEHYDROGENASE: LDH: 325 U/L — ABNORMAL HIGH (ref 98–192)

## 2021-12-28 LAB — PROTIME-INR
INR: 3.5 — ABNORMAL HIGH (ref 0.8–1.2)
Prothrombin Time: 35.1 seconds — ABNORMAL HIGH (ref 11.4–15.2)

## 2021-12-28 LAB — MRSA NEXT GEN BY PCR, NASAL: MRSA by PCR Next Gen: NOT DETECTED

## 2021-12-28 MED ORDER — MIDODRINE HCL 5 MG PO TABS
15.0000 mg | ORAL_TABLET | Freq: Three times a day (TID) | ORAL | Status: DC
  Administered 2021-12-28 – 2021-12-30 (×5): 15 mg via ORAL
  Filled 2021-12-28 (×5): qty 3

## 2021-12-28 MED ORDER — ETOMIDATE 2 MG/ML IV SOLN
INTRAVENOUS | Status: AC | PRN
  Administered 2021-12-28: 10 mg via INTRAVENOUS

## 2021-12-28 MED ORDER — LEVOTHYROXINE SODIUM 75 MCG PO TABS: 87.5000 ug | ORAL_TABLET | ORAL | Status: DC

## 2021-12-28 MED ORDER — WARFARIN SODIUM 2.5 MG PO TABS
2.5000 mg | ORAL_TABLET | Freq: Once | ORAL | Status: AC
  Administered 2021-12-28: 2.5 mg via ORAL
  Filled 2021-12-28: qty 1

## 2021-12-28 MED ORDER — ACETAMINOPHEN 325 MG PO TABS
650.0000 mg | ORAL_TABLET | ORAL | Status: DC | PRN
  Administered 2021-12-30 – 2022-01-01 (×4): 650 mg via ORAL
  Filled 2021-12-28 (×4): qty 2

## 2021-12-28 MED ORDER — LEVOTHYROXINE SODIUM 75 MCG PO TABS
175.0000 ug | ORAL_TABLET | ORAL | Status: DC
  Administered 2021-12-29 – 2022-01-02 (×4): 175 ug via ORAL
  Filled 2021-12-28 (×4): qty 1

## 2021-12-28 MED ORDER — MIDAZOLAM HCL 2 MG/2ML IJ SOLN
2.0000 mg | Freq: Once | INTRAMUSCULAR | Status: DC
  Filled 2021-12-28: qty 2

## 2021-12-28 MED ORDER — PHENYLEPHRINE 80 MCG/ML (10ML) SYRINGE FOR IV PUSH (FOR BLOOD PRESSURE SUPPORT)
80.0000 ug | PREFILLED_SYRINGE | Freq: Once | INTRAVENOUS | Status: DC | PRN
  Filled 2021-12-28: qty 10

## 2021-12-28 MED ORDER — ROSUVASTATIN CALCIUM 20 MG PO TABS
20.0000 mg | ORAL_TABLET | Freq: Every morning | ORAL | Status: DC
  Administered 2021-12-28 – 2022-01-02 (×6): 20 mg via ORAL
  Filled 2021-12-28 (×6): qty 1

## 2021-12-28 MED ORDER — MEXILETINE HCL 150 MG PO CAPS
150.0000 mg | ORAL_CAPSULE | Freq: Two times a day (BID) | ORAL | Status: DC
  Administered 2021-12-28 – 2022-01-02 (×10): 150 mg via ORAL
  Filled 2021-12-28 (×11): qty 1

## 2021-12-28 MED ORDER — ACETAMINOPHEN 500 MG PO TABS: 1000.0000 mg | ORAL_TABLET | Freq: Four times a day (QID) | ORAL | Status: DC | PRN

## 2021-12-28 MED ORDER — TRAZODONE HCL 50 MG PO TABS
50.0000 mg | ORAL_TABLET | Freq: Every day | ORAL | Status: DC
  Administered 2021-12-28 – 2022-01-01 (×5): 50 mg via ORAL
  Filled 2021-12-28 (×5): qty 1

## 2021-12-28 MED ORDER — PANTOPRAZOLE SODIUM 40 MG PO TBEC
40.0000 mg | DELAYED_RELEASE_TABLET | Freq: Every day | ORAL | Status: DC
  Administered 2021-12-29 – 2022-01-02 (×5): 40 mg via ORAL
  Filled 2021-12-28 (×5): qty 1

## 2021-12-28 MED ORDER — LEVOTHYROXINE SODIUM 175 MCG PO TABS: 87.5000 ug | ORAL_TABLET | ORAL | Status: DC

## 2021-12-28 MED ORDER — MIDAZOLAM HCL 2 MG/2ML IJ SOLN
INTRAMUSCULAR | Status: AC | PRN
Start: 2021-12-28 — End: 2021-12-28
  Administered 2021-12-28: 1 mg via INTRAVENOUS

## 2021-12-28 MED ORDER — MAGNESIUM SULFATE 2 GM/50ML IV SOLN
2.0000 g | Freq: Once | INTRAVENOUS | Status: AC
  Administered 2021-12-28: 2 g via INTRAVENOUS
  Filled 2021-12-28: qty 50

## 2021-12-28 MED ORDER — ONDANSETRON HCL 4 MG/2ML IJ SOLN: 4.0000 mg | Freq: Four times a day (QID) | INTRAMUSCULAR | Status: DC | PRN

## 2021-12-28 MED ORDER — CITALOPRAM HYDROBROMIDE 20 MG PO TABS
10.0000 mg | ORAL_TABLET | Freq: Every morning | ORAL | Status: DC
  Administered 2021-12-29 – 2022-01-02 (×5): 10 mg via ORAL
  Filled 2021-12-28 (×5): qty 1

## 2021-12-28 MED ORDER — WARFARIN - PHARMACIST DOSING INPATIENT: Freq: Every day | Status: DC

## 2021-12-28 MED ORDER — PREDNISONE 20 MG PO TABS
40.0000 mg | ORAL_TABLET | Freq: Once | ORAL | Status: AC
  Administered 2021-12-28: 40 mg via ORAL
  Filled 2021-12-28: qty 2

## 2021-12-28 MED ORDER — SODIUM CHLORIDE 0.9 % IV SOLN
INTRAVENOUS | Status: AC | PRN
Start: 2021-12-28 — End: 2021-12-28
  Administered 2021-12-28: 500 mL via INTRAVENOUS

## 2021-12-28 MED ORDER — ASPIRIN EC 81 MG PO TBEC
81.0000 mg | DELAYED_RELEASE_TABLET | Freq: Every morning | ORAL | Status: DC
  Administered 2021-12-29 – 2022-01-02 (×5): 81 mg via ORAL
  Filled 2021-12-28 (×5): qty 1

## 2021-12-28 MED ORDER — TRAMADOL HCL 50 MG PO TABS: 50.0000 mg | ORAL_TABLET | Freq: Four times a day (QID) | ORAL | Status: DC | PRN

## 2021-12-28 MED ORDER — TRAMADOL HCL 50 MG PO TABS
50.0000 mg | ORAL_TABLET | Freq: Two times a day (BID) | ORAL | Status: DC | PRN
  Administered 2021-12-30 (×2): 100 mg via ORAL
  Filled 2021-12-28 (×2): qty 2

## 2021-12-28 MED ORDER — ETOMIDATE 2 MG/ML IV SOLN
10.0000 mg | Freq: Once | INTRAVENOUS | Status: DC
  Filled 2021-12-28: qty 10

## 2021-12-28 NOTE — ED Provider Notes (Signed)
?[facility] ?Provider Note ? ? ?CSN: [id number] ?Arrival date & time: 12/28/21  1210 ? ?  ? ?History ? ?Chief Complaint  ?Patient presents with  ? Weakness  ? ? ?Johnny Oneill is a 81 y.o. male with a past medical history of heart failure on LVAD who presents emergency department in V. tach.  He is seen at bedside by Dr. Haroldine Laws and shared visit.  Patient was taken off his sotalol recently due to some worsening weakness, orthostatic hypotension, and abnormal kidney function.  He was feeling extremely weak and noted that his heart was racing.  Patient noted to be in V. tach at a rate of 209. ? ? ?Weakness ? ?  ? ?Home Medications ?Prior to Admission medications   ?Medication Sig Start Date End Date Taking? Authorizing Provider  ?acetaminophen (TYLENOL) 500 MG tablet Take 1,000 mg by mouth every 6 (six) hours as needed (pain).    [provider]  ?aspirin EC 81 MG tablet Take 81 mg by mouth in the morning.    [provider]  ?Cholecalciferol (VITAMIN D) 125 MCG (5000 UT) CAPS Take 5,000 Units by mouth in the morning.    [provider]  ?citalopram (CELEXA) 10 MG tablet Take 1 tablet by mouth once daily ?Patient taking differently: Take 10 mg by mouth every morning. 03/19/21   Bensimhon, Shaune Pascal, MD  ?Coenzyme Q10 (COQ10) 200 MG CAPS Take 200 mg by mouth in the morning.    [provider]  ?Famotidine (PEPCID PO) Take 1 tablet by mouth every morning.    [provider]  ?furosemide (LASIX) 20 MG tablet Take 1 tablet (20 mg total) by mouth daily as needed (Take 20 mg as daily as needed for wt > 153 lbs). Reported on 01/14/2016 ?Patient not taking: Reported on 11/26/2021 02/21/21   Shirley Friar, PA-C  ?melatonin 5 MG TABS Take 5 mg by mouth at bedtime.    [provider]  ?mexiletine (MEXITIL) 150 MG capsule Take 1 capsule (150 mg total) by mouth 2 (two) times daily. 06/19/21   Bensimhon, Shaune Pascal, MD  ?midodrine  (PROAMATINE) 10 MG tablet Take 1.5 tablets (15 mg total) by mouth 3 (three) times daily with meals. 12/24/21   Bensimhon, Shaune Pascal, MD  ?oxyCODONE (OXY IR/ROXICODONE) 5 MG immediate release tablet Take 1 tablet (5 mg total) by mouth every 12 (twelve) hours as needed for breakthrough pain. 12/12/21   Clegg, Amy D, NP  ?pantoprazole (PROTONIX) 40 MG tablet Take 1 tablet by mouth once daily ?Patient taking differently: 40 mg every morning. 08/11/21   Bensimhon, Shaune Pascal, MD  ?potassium chloride SA (KLOR-CON) 20 MEQ tablet Take 1 tablet (20 mEq total) by mouth daily as needed. Take one potassium pill when you take your lasix. ?Patient not taking: Reported on 11/26/2021 02/21/21   Shirley Friar, PA-C  ?Probiotic Product (PROBIOTIC PO) Take 1 capsule by mouth in the morning.    [provider]  ?rosuvastatin (CRESTOR) 20 MG tablet Take 1 tablet (20 mg total) by mouth daily. ?Patient taking differently: Take 20 mg by mouth every morning. 11/26/21   Bensimhon, Shaune Pascal, MD  ?SYNTHROID 175 MCG tablet 1 tablet daily every morning except half tablet on Sundays ?Patient taking differently: Take 87.5-175 mcg by mouth See admin instructions. Take one tablet (175 mcg) by mouth before breakfast Monday thru Saturday, take 1/2 tablet (87.5 mcg) on Sundays before breakfast 10/08/21   Elayne Snare, MD  ?  traMADol (ULTRAM) 50 MG tablet Take 1-2 tablets (50-100 mg total) by mouth every 6 (six) hours as needed for moderate pain. 12/12/21   Clegg, Amy D, NP  ?traZODone (DESYREL) 50 MG tablet Take 1 tablet (50 mg total) by mouth at bedtime. 12/31/20   Bensimhon, Shaune Pascal, MD  ?warfarin (COUMADIN) 5 MG tablet Take 7.5 mg 4/14 then 5 mg (1 tablet) every day as directed by HF Clinic ?Patient taking differently: Take 5 mg by mouth at bedtime. Or as directed by HF Clinic 12/12/21   Conrad Mangham, NP  ?   ? ?Allergies    ?Ace inhibitors, Amiodarone, Diovan [valsartan], Lipitor [atorvastatin calcium], Jardiance [empagliflozin], and Norvasc  [amlodipine]   ? ?Review of Systems   ?Review of Systems  ?Neurological:  Positive for weakness.  ? ?Physical Exam ?Updated Vital Signs ?BP 108/62 (BP Location: Left Arm)   Pulse (!) 206   Temp 97.8 ?F (36.6 ?C) (Oral)   Resp 20   Ht 5\' 7"  (1.702 m)   Wt 71.5 kg   SpO2 96%   BMI 24.69 kg/m?  ?Physical Exam ?Vitals and nursing note reviewed.  ?Constitutional:   ?   General: He is not in acute distress. ?   Appearance: He is well-developed. He is not diaphoretic.  ?HENT:  ?   Head: Normocephalic and atraumatic.  ?Eyes:  ?   General: No scleral icterus. ?   Conjunctiva/sclera: Conjunctivae normal.  ?Pulmonary:  ?   Effort: Pulmonary effort is normal. No respiratory distress.  ?   Breath sounds: Normal breath sounds.  ?Abdominal:  ?   Palpations: Abdomen is soft.  ?   Tenderness: There is no abdominal tenderness.  ?Musculoskeletal:  ?   Cervical back: Normal range of motion and neck supple.  ?Skin: ?   General: Skin is warm and dry.  ?Neurological:  ?   Mental Status: He is alert.  ?Psychiatric:     ?   Behavior: Behavior normal.  ? ? ?ED Results / Procedures / Treatments   ?Labs ?(all labs ordered are listed, but only abnormal results are displayed) ?Labs Reviewed  ?CBC WITH DIFFERENTIAL/PLATELET  ?COMPREHENSIVE METABOLIC PANEL  ?PROTIME-INR  ?LACTATE DEHYDROGENASE  ?MAGNESIUM  ? ? ?EKG ?None ? ?Radiology ?No results found. ? ?Procedures ?Marland KitchenCritical Care ?Performed by: Margarita Mail, PA-C ?Authorized by: Margarita Mail, PA-C  ? ?Critical care provider statement:  ?  Critical care time (minutes):  30 ?  Critical care was necessary to treat or prevent imminent or life-threatening deterioration of the following conditions:  Cardiac failure ?  Critical care was time spent personally by me on the following activities:  Development of treatment plan with patient or surrogate, discussions with consultants, evaluation of patient's response to treatment, examination of patient, ordering and review of laboratory  studies, ordering and review of radiographic studies, ordering and performing treatments and interventions, pulse oximetry, re-evaluation of patient's condition and review of old charts  ?Sedation and cardioversion performed by Dr. Gilford Raid, Dr. Barnie Del, please see separate notation. ?Back in sinus rhythm after cardioversion ? ?Medications Ordered in ED ?Medications  ?etomidate (AMIDATE) injection 10 mg (has no administration in time range)  ?midazolam (VERSED) injection 2 mg (has no administration in time range)  ? ? ?ED Course/ Medical Decision Making/ A&P ?  ?                        ?Medical Decision Making ?81 year old male here with ventricular tachycardia on  LVAD.  Renal function is at baseline.  Labs are otherwise at baseline.  Patient will be admitted by the heart failure service.  He is sleepy but oriented after his cardioversion. ? ?Problems Addressed: ?V-tach [facility]): complicated acute illness or injury ? ?Amount and/or Complexity of Data Reviewed ?Labs: ordered. ? ?Risk ?Prescription drug management. ?Decision regarding hospitalization. ? ?Final Clinical Impression(s) / ED Diagnoses ?Final diagnoses:  ?V-tach [facility])  ? ? ?Rx / DC Orders ?ED Discharge Orders   ? ? None  ? ?  ? ? ?  ?Margarita Mail, PA-C ?12/28/21 1451 ? ?  ?Isla Pence, MD ?12/28/21 1609 ? ?

## 2021-12-28 NOTE — ED Provider Notes (Signed)
?  Physical Exam  ?BP (!) 114/101   Pulse 87   Temp 98.4 ?F (36.9 ?C) (Oral)   Resp 15   Ht 5\' 7"  (1.702 m)   Wt 71.5 kg   SpO2 100%   BMI 24.69 kg/m?  ? ?Physical Exam ? ?Procedures  ?.Sedation ? ?Date/Time: 12/28/2021 4:05 PM ?Performed by: Isla Pence, MD ?Authorized by: Isla Pence, MD  ? ?Consent:  ?  Consent obtained:  Written ?  Consent given by:  Spouse ?Universal protocol:  ?  Immediately prior to procedure, a time out was called: yes   ?Pre-sedation assessment:  ?  Time since last food or drink:  5 ?  ASA classification: class 4 - patient with severe systemic disease that is a constant threat to life   ?  Mallampati score:  II - soft palate, uvula, fauces visible ?  Pre-sedation assessments completed and reviewed: airway patency, cardiovascular function, hydration status, mental status, nausea/vomiting, pain level, respiratory function and temperature   ?Immediate pre-procedure details:  ?  Reassessment: Patient reassessed immediately prior to procedure   ?  Reviewed: vital signs   ?  Verified: bag valve mask available, emergency equipment available, intubation equipment available and IV patency confirmed   ?Procedure details (see MAR for exact dosages):  ?  Preoxygenation:  Nasal cannula ?  Sedation:  Etomidate and midazolam ?  Intended level of sedation: deep ?  Intra-procedure monitoring:  Blood pressure monitoring, cardiac monitor, continuous capnometry, continuous pulse oximetry, frequent LOC assessments and frequent vital sign checks ?  Intra-procedure events: none   ?  Total Provider sedation time (minutes):  30 ?Post-procedure details:  ?  Post-sedation assessment completed:  12/28/2021 2:00 PM ?  Attendance: Constant attendance by certified staff until patient recovered   ?  Recovery: Patient returned to pre-procedure baseline   ?  Post-sedation assessments completed and reviewed: airway patency, cardiovascular function, hydration status, mental status, nausea/vomiting, pain level,  respiratory function and temperature   ?  Patient is stable for discharge or admission: yes   ?  Procedure completion:  Tolerated well, no immediate complications ? ?ED Course / MDM  ?  ?Medical Decision Making ?Amount and/or Complexity of Data Reviewed ?Labs: ordered. ? ?Risk ?Decision regarding hospitalization. ? ? ? ? ? ? ? ?  ?Isla Pence, MD ?12/28/21 1607 ? ?

## 2021-12-28 NOTE — H&P (Signed)
?Advanced Heart Failure VAD History and Physical Note  ? ?PCP-Cardiologist: None  ? ?Reason for Admission: Syncope ? ?HPI:   ? ?Johnny Oneill is a 81 y.o. 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  ?  ?Presented to ER on 06/27/19 with syncopal episode and head injury from fall. ECG showed VT @ 240. Underwent emergent DC-CV in ER. Head CT no bleed. Admitted and had ICD gen replacement (EOL). ?  ?Did not tolerate amio due to tremors. ?  ?Had ICD shock for VT on 06/09/20. Started on sotalol ?  ?On 02/06/21 Paced out of AFL. ?  ?Seen in ER 05/2021 due to LOW FLOW alarms. Found to be in VT rate 170. DCCV x 1 - returned to [place] with frequent PVCs. Pacer settings adjusted by [name] PA- DDD 90. Post cardioversion persistent LOW FLOWS noted. Received 2 L IVF. Low flows resolved, and he was discharged home.  ?  ?[place] 11/22 ?RA = 5 ?RV = 33/5 ?PA = 33/13 (20) ?PCW = 10 ?Fick cardiac output/index = 4.0/2.1 ?Thermo CO/CI = 4.9/2.6 ?PVR = 2.0 WU ?Ao sat = 95% ?PA sat = 54%, 59% ?PAPI = 4.0 ?  ?Admitted earlier this month for R TKA. Did well with surgery but complicated by severe orthostatic hypotension. Now on midodrine 10 tid. BP improved.  ? ?Admitted last week with weakness and syncope. ICD interrogation normal. + orthostatic. Felt to be related to volume depletion. Hydrated. Midodrine continued. Sotalol stopped due to bradycardia ? ?Has been feeling very weak x 24 hours. Unable to stand. Wife unable to measure BP with cuff. VAD PI down from baseline.  Called EMS. Found to be in recurrent VT.  ? ?On arrival to ED, was in VT rates 200-210. MAP 70-80s. Denied CP or SOB. Felt weak  ? ?Underwent emergent DC-CV in ED ? ? ?LVAD INTERROGATION:  ?HeartMate II LVAD:  Flow 3.0 liters/min, speed 9600, power 4.8, PI 4.3  Multiple PI events. No low flow alarms ? ? ?Review of Systems: [y] = yes, [ ]  = no  ? ?General: Weight gain [ ] ; Weight loss  [ ] ; Anorexia [ ] ; Fatigue [ ] ; Fever [ ] ; Chills [ ] ; Weakness Blue.Reese ]  ?Cardiac: Chest pain/pressure [ ] ; Resting SOB [ ] ; Exertional SOB [Y ]; Orthopnea [ ] ; Pedal Edema [ ] ; Palpitations [ ] ; Syncope [ ] ; Presyncope [ ] ; Paroxysmal nocturnal dyspnea[ ]   ?Pulmonary: Cough [ ] ; Wheezing[ ] ; Hemoptysis[ ] ; Sputum [ ] ; Snoring [ ]   ?GI: Vomiting[ ] ; Dysphagia[ ] ; Melena[ ] ; Hematochezia [ ] ; Heartburn[ ] ; Abdominal pain [ ] ; Constipation [ ] ; Diarrhea [ ] ; BRBPR [ ]   ?GU: Hematuria[ ] ; Dysuria [ ] ; Nocturia[ ]   ?Vascular: Pain in legs with walking [ ] ; Pain in feet with lying flat [ Y]; Non-healing sores [ ] ; Stroke [ ] ; TIA [ ] ; Slurred speech [ ] ;  ?Neuro: Headaches[ ] ; Vertigo[ ] ; Seizures[ ] ; Paresthesias[ ] ;Blurred vision [ ] ; Diplopia [ ] ; Vision changes [ ]   ?Ortho/Skin: Arthritis [ Y]; Joint pain [Y]; Muscle pain [ ] ; Joint swelling [Y ]; Back Pain [ ] ; Rash [ ]   ?Psych: Depression[ ] ; Anxiety[ ]   ?Heme: Bleeding problems [ ] ; Clotting disorders [ ] ; Anemia [ ]   ?Endocrine: Diabetes [ ] ; Thyroid dysfunction[Y] ?  ? ?Home Medications ?Prior to Admission medications   ?Medication Sig Start Date End Date Taking? Authorizing Provider  ?  acetaminophen (TYLENOL) 500 MG tablet Take 1,000 mg by mouth every 6 (six) hours as needed (pain).   Yes [provider]  ?aspirin EC 81 MG tablet Take 81 mg by mouth in the morning.   Yes [provider]  ?Cholecalciferol (VITAMIN D) 125 MCG (5000 UT) CAPS Take 5,000 Units by mouth in the morning.   Yes [provider]  ?citalopram (CELEXA) 10 MG tablet Take 1 tablet by mouth once daily ?Patient taking differently: Take 10 mg by mouth every morning. 03/19/21  Yes Fany Cavanaugh, Shaune Pascal, MD  ?Coenzyme Q10 (COQ10) 200 MG CAPS Take 200 mg by mouth in the morning.   Yes [provider]  ?Famotidine (PEPCID PO) Take 1 tablet by mouth every morning.   Yes [provider]  ?melatonin 5 MG TABS Take 5 mg by mouth at bedtime.   Yes [provider]  ?mexiletine (MEXITIL) 150 MG capsule Take 1 capsule (150 mg total) by mouth 2 (two) times daily. 06/19/21  Yes Mandell Pangborn, Shaune Pascal, MD  ?midodrine (PROAMATINE) 10 MG tablet Take 1 tablet (10 mg total) by mouth 3 (three) times daily with meals. 12/12/21  Yes Clegg, Amy D, NP  ?oxyCODONE (OXY IR/ROXICODONE) 5 MG immediate release tablet Take 1 tablet (5 mg total) by mouth every 12 (twelve) hours as needed for breakthrough pain. 12/12/21  Yes Clegg, Amy D, NP  ?pantoprazole (PROTONIX) 40 MG tablet Take 1 tablet by mouth once daily ?Patient taking differently: 40 mg every morning. 08/11/21  Yes Aaron Boeh, Shaune Pascal, MD  ?Probiotic Product (PROBIOTIC PO) Take 1 capsule by mouth in the morning.   Yes [provider]  ?rosuvastatin (CRESTOR) 20 MG tablet Take 1 tablet (20 mg total) by mouth daily. ?Patient taking differently: Take 20 mg by mouth every morning. 11/26/21  Yes Clayten Allcock, Shaune Pascal, MD  ?sotalol (BETAPACE) 80 MG tablet TAKE 1 TABLET (80 MG TOTAL) BY MOUTH DAILY. ?Patient taking differently: Take 80 mg by mouth every morning. 07/22/21 07/22/22 Yes Aniyiah Zell, Shaune Pascal, MD  ?SYNTHROID 175 MCG tablet 1 tablet daily every morning except half tablet on Sundays ?Patient taking differently: Take 87.5-175 mcg by mouth See admin instructions. Take one tablet (175 mcg) by mouth before breakfast Monday thru Saturday, take 1/2 tablet (87.5 mcg) on Sundays before breakfast 10/08/21  Yes Elayne Snare, MD  ?traMADol (ULTRAM) 50 MG tablet Take 1-2 tablets (50-100 mg total) by mouth every 6 (six) hours as needed for moderate pain. 12/12/21  Yes Clegg, Amy D, NP  ?traZODone (DESYREL) 50 MG tablet Take 1 tablet (50 mg total) by mouth at bedtime. 12/31/20  Yes Melisse Caetano, Shaune Pascal, MD  ?warfarin (COUMADIN) 5 MG tablet Take 7.5 mg 4/14 then 5 mg (1 tablet) every day as directed by HF Clinic ?Patient taking differently: Take 5 mg by mouth at bedtime. Or as directed by HF Clinic 12/12/21  Yes Clegg, Amy D, NP  ?furosemide  (LASIX) 20 MG tablet Take 1 tablet (20 mg total) by mouth daily as needed (Take 20 mg as daily as needed for wt > 153 lbs). Reported on 01/14/2016 ?Patient not taking: Reported on 11/26/2021 02/21/21   Shirley Friar, PA-C  ?potassium chloride SA (KLOR-CON) 20 MEQ tablet Take 1 tablet (20 mEq total) by mouth daily as needed. Take one potassium pill when you take your lasix. ?Patient not taking: Reported on 11/26/2021 02/21/21   Shirley Friar, PA-C  ? ? ?Past Medical History: ?Past Medical History:  ?Diagnosis Date  ?  Anxiety   ? Automatic implantable cardioverter-defibrillator in situ   ? CAD (coronary artery disease)   ? S/P stenting of LCX in 2004;  s/p Lat MI 2009 with occlusion of the LCX - treated with Promus stenting. Has total occlusion of the RCA.   ? CHF (congestive heart failure) ([place])   ? Hypercholesteremia   ? Hypertension   ? Hypothyroidism   ? ICD (implantable cardiac defibrillator) in place   ? PROPHYLACTIC      medtronic  ? Inferior MI ([place]) "? date"; 2009  ? Ischemic cardiomyopathy   ? a. 10/09 Echo: Sev LV Dysfxn, inf/lat AK, Mod MR.  ? Liver disease   ? LVAD (left ventricular assist device) present ([place])   ? Osteoarthritis   ? a. s/p R TKR  ? Pacemaker   ? PVC (premature ventricular contraction)   ? RBBB   ? Shortness of breath   ? ? ?Past Surgical History: ?Past Surgical History:  ?Procedure Laterality Date  ? CARPAL TUNNEL RELEASE Right 05/29/2016  ? Procedure: CARPAL TUNNEL RELEASE;  Surgeon: Earlie Server, MD;  Location: [facility];  Service: Orthopedics;  Laterality: Right;  ? CORONARY ANGIOPLASTY WITH STENT PLACEMENT  2004  ? CORONARY ANGIOPLASTY WITH STENT PLACEMENT  07/2008  ? DEFIBRILLATOR  2009  ? ESOPHAGOGASTRODUODENOSCOPY N/A 09/15/2013  ? Procedure: ESOPHAGOGASTRODUODENOSCOPY (EGD);  Surgeon: Ladene Artist, MD;  Location[facility];  Service: Endoscopy;  Laterality: N/A;  bedside  ? HEMATOMA EVACUATION Right 11/06/2013  ? Procedure: EVACUATION HEMATOMA;  Surgeon: Gaye Pollack, MD;  Location: [place];  Service: Cardiothoracic;  Laterality: Right;  ? ICD GENERATOR CHANGEOUT N/A 06/28/2019  ? Procedure: ICD GENERATOR CHANGEOUT;  Surgeon: Constance Haw, MD;  Location: [place]

## 2021-12-28 NOTE — Progress Notes (Signed)
ANTICOAGULATION CONSULT NOTE ?Pharmacy Consult for warfarin  ?Indication: LVAD ? ?Allergies  ?Allergen Reactions  ? Ace Inhibitors   ?  Hypotension and elevated creatinine  ? Amiodarone Other (See Comments)  ?  Cerebellar side effects  ? Diovan [Valsartan] Other (See Comments)  ?  Hypotension at low dose, hyperkalemia  ? Lipitor [Atorvastatin Calcium] Other (See Comments)  ?  Muscle pain  ? Jardiance [Empagliflozin] Other (See Comments)  ?  Unsure of exact side effect  ? Norvasc [Amlodipine] Other (See Comments)  ?  Put patient to sleep  ? ? ?Patient Measurements: ?Height: 5\' 7"  (170.2 cm) ?Weight: 71.5 kg (157 lb 10.1 oz) ?IBW/kg (Calculated) : 66.1 ? Heparin dosing weight: 71.1 kg ? ?Vital Signs: ?Temp: 98.4 ?F (36.9 ?C) (04/30 1456) ?Temp Source: Oral (04/30 1456) ?BP: 126/106 (04/30 1600) ?Pulse Rate: 87 (04/30 1455) ? ?Labs: ?Recent Labs  ?  12/28/21 ?1225  ?HGB 9.1*  ?HCT 29.3*  ?PLT 226  ?LABPROT 35.1*  ?INR 3.5*  ?CREATININE 2.43*  ? ? ? ?Estimated Creatinine Clearance: 23.4 mL/min (A) (by C-G formula based on SCr of 2.43 mg/dL (H)). ? ? 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 Pharmacy consulted to dose warfarin.  ? ?INR 3.5 > goal on admission for VT> s/p DCCV in ED.   ?Hgb 9 stable no bleeding noted.  LDH up slightly 300s ?PTA warfarin dose: 5 mg daily ? 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  ?Plan:  ?Warfarin 2.5mg  x1 today  ?Daily INR ? ? ?Bonnita Nasuti Pharm.D. CPP, BCPS ?Clinical Pharmacist ?[phone] ?12/28/2021 4:11 PM  ? ? ?

## 2021-12-28 NOTE — ED Triage Notes (Signed)
Patient has been weak and dizzy and has LVAD and is in vtach.  Patient is orthostatic.  Patient stopped taking sotalol for knee surgery. LVAD coordinator reports he will need to be override paced.  ?

## 2021-12-28 NOTE — CV Procedure (Signed)
? ?   DIRECT CURRENT CARDIOVERSION ? ?NAME:  Johnny Oneill   MRN: [id number] ?DOB:  Jan 02, 1941   ADMIT DATE: 12/28/2021 ? ? ?INDICATIONS: Ventricular Tachycardia ? ? ?PROCEDURE:  ? ?Informed consent was obtained prior to the procedure. The risks, benefits and alternatives for the procedure were discussed and the patient comprehended these risks. Once an appropriate time out was taken, the patient had the defibrillator pads placed in the anterior and posterior position. The patient then underwent sedation by the ED team. Once an appropriate level of sedation was achieved, the patient received a single biphasic, synchronized 200J shock with prompt conversion to sinus rhythm. No apparent complications. ? ?Glori Bickers, MD  ?1:14 PM  ?

## 2021-12-29 ENCOUNTER — Encounter: Payer: Medicare Other | Admitting: Cardiology

## 2021-12-29 ENCOUNTER — Encounter (HOSPITAL_COMMUNITY): Payer: Medicare Other

## 2021-12-29 DIAGNOSIS — I472 Ventricular tachycardia, unspecified: Secondary | ICD-10-CM | POA: Diagnosis not present

## 2021-12-29 DIAGNOSIS — I5082 Biventricular heart failure: Secondary | ICD-10-CM

## 2021-12-29 DIAGNOSIS — I4892 Unspecified atrial flutter: Secondary | ICD-10-CM

## 2021-12-29 DIAGNOSIS — I5022 Chronic systolic (congestive) heart failure: Secondary | ICD-10-CM | POA: Diagnosis not present

## 2021-12-29 LAB — CBC
HCT: 27 % — ABNORMAL LOW (ref 39.0–52.0)
Hemoglobin: 8.6 g/dL — ABNORMAL LOW (ref 13.0–17.0)
MCH: 29.8 pg (ref 26.0–34.0)
MCHC: 31.9 g/dL (ref 30.0–36.0)
MCV: 93.4 fL (ref 80.0–100.0)
Platelets: 214 10*3/uL (ref 150–400)
RBC: 2.89 MIL/uL — ABNORMAL LOW (ref 4.22–5.81)
RDW: 15 % (ref 11.5–15.5)
WBC: 6.8 10*3/uL (ref 4.0–10.5)
nRBC: 0 % (ref 0.0–0.2)

## 2021-12-29 LAB — BASIC METABOLIC PANEL
Anion gap: 9 (ref 5–15)
BUN: 27 mg/dL — ABNORMAL HIGH (ref 8–23)
CO2: 22 mmol/L (ref 22–32)
Calcium: 8.8 mg/dL — ABNORMAL LOW (ref 8.9–10.3)
Chloride: 103 mmol/L (ref 98–111)
Creatinine, Ser: 2.24 mg/dL — ABNORMAL HIGH (ref 0.61–1.24)
GFR, Estimated: 29 mL/min — ABNORMAL LOW (ref >60)
Glucose, Bld: 148 mg/dL — ABNORMAL HIGH (ref 70–99)
Potassium: 4.7 mmol/L (ref 3.5–5.1)
Sodium: 134 mmol/L — ABNORMAL LOW (ref 135–145)

## 2021-12-29 LAB — LACTATE DEHYDROGENASE: LDH: 324 U/L — ABNORMAL HIGH (ref 98–192)

## 2021-12-29 LAB — PROTIME-INR
INR: 3.5 — ABNORMAL HIGH (ref 0.8–1.2)
Prothrombin Time: 35 seconds — ABNORMAL HIGH (ref 11.4–15.2)

## 2021-12-29 LAB — MAGNESIUM: Magnesium: 2.4 mg/dL (ref 1.7–2.4)

## 2021-12-29 MED ORDER — CHLORHEXIDINE GLUCONATE CLOTH 2 % EX PADS
6.0000 | MEDICATED_PAD | Freq: Every day | CUTANEOUS | Status: DC
  Administered 2021-12-29 – 2022-01-01 (×3): 6 via TOPICAL

## 2021-12-29 MED ORDER — SODIUM CHLORIDE 0.9% FLUSH: 3.0000 mL | INTRAVENOUS | Status: DC | PRN

## 2021-12-29 MED ORDER — SOTALOL HCL 80 MG PO TABS
80.0000 mg | ORAL_TABLET | Freq: Every day | ORAL | Status: DC
Start: 2021-12-29 — End: 2021-12-31
  Administered 2021-12-29 – 2021-12-30 (×2): 80 mg via ORAL
  Filled 2021-12-29 (×3): qty 1

## 2021-12-29 MED ORDER — SOTALOL HCL 80 MG PO TABS
80.0000 mg | ORAL_TABLET | Freq: Every day | ORAL | Status: DC
  Filled 2021-12-29: qty 1

## 2021-12-29 MED ORDER — SODIUM CHLORIDE 0.9% FLUSH
3.0000 mL | Freq: Two times a day (BID) | INTRAVENOUS | Status: DC
  Administered 2021-12-29 – 2022-01-02 (×7): 3 mL via INTRAVENOUS

## 2021-12-29 MED ORDER — SODIUM CHLORIDE 0.9 % IV SOLN: 250.0000 mL | INTRAVENOUS | Status: DC | PRN

## 2021-12-29 MED ORDER — WARFARIN SODIUM 2.5 MG PO TABS
2.5000 mg | ORAL_TABLET | Freq: Once | ORAL | Status: AC
  Administered 2021-12-29: 2.5 mg via ORAL
  Filled 2021-12-29: qty 1

## 2021-12-29 NOTE — Progress Notes (Signed)
LVAD Coordinator Rounding Note: ? ?Admitted 12/28/21 from ED to Dr. Haroldine Laws service for VT. ? 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 Darcey [name] under bridge to transplant criteria. Patient was removed from transplant list 05/2016 per his choice. He is currently Destination Therapy LVAD.  ? ?Pts wife paged VAD pager yesterday w/ c/o weakness and low flows. Pt presented to ED in VT. Cardioversion performed in ED by Dr Haroldine Laws ? ?Patient laying in bed this morning. Says he feels much better. Denies complaints.  ? ? ?Vital signs       ?Temp: 98 ?HR: 87    ?Doppler Pressure: 118     ?Automatic BP: 122/104 (111)     ?O2 Sat: 99% on RA ?Wt:  157.6 lbs ? ?LVAD interrogation reveals:  ?Speed: 9600    ?Flow:  4.4    ?Power: 5.5   ?PI: 7.5     ? ?Alarms: none ?Events: 10 - 20 daily ? ?Fixed speed:  9600 ?Low speed limit: 9000 ? ?Drive Line:  Drive line is being maintained weekly by [name], his wife. Existing VAD dressing C/D/I S[place]av[place] dressing and Silverlon patch intact. Exit site healed and incorporated, the velour is fully implanted at exit site. Pt denies fever or chills. Weekly dressing changes per [facility] nurse or caregiver. Next dressing change due 01/01/22. ? ?Labs:  ?LDH trend: 324 ? ?INR trend: 3.5 ? ?Anticoagulation Plan: ?-INR Goal:  2.5 - 3.0 ?-ASA Dose: 81 mg daily ? ? ?Device: Medtronic dual ?- Therapies: on 231 bpm ?- Pacing:  AAI<=>DDD 80 bpm ?- Last check: in ED per Medtronic rep on 12/18/21 - rate response turned on ? ? ?Plan/Recommendations:  ?Call VAD Coordinator with any VAD equipment or drive line issues. ?Weekly dressing changes per [facility] nurse or caregiver.  ? ?Tanda Rockers RN ?VAD Coordinator  ?Office: [phone]  ?24/7 Pager: [phone]  ? ? ? ? ? ? ? ? ? ?

## 2021-12-29 NOTE — TOC CM/SW Note (Addendum)
HF TOC CM reviewed chart, recent dc to home. Pt was recently active with [facility] for [facility]. Has RW at home. Will continue to follow for dc needs, waiting PT/OT recommendations, IP rehab vs HH.  Jonnie Finner RN3 CCM, Heart Failure TOC CM [phone]  ?

## 2021-12-29 NOTE — Evaluation (Signed)
Occupational Therapy Evaluation ?Patient Details ?Name: Johnny Oneill ?MRN: [id number] ?DOB: [birth year] ?Today's Date: 12/29/2021 ? ? ?History of Present Illness Pt is a 81 y.o. male who presented 4/30 with weakness. Found in VT 200-210, underwent emergent DV-CV in ED.  Of note, pt admitted 12/01/21 - 3/81/01 for R TKA, complicated by orthostatic hypotension and again 4/20-21 with hypotension. PMhx: CKD, CHF, HTN, LVAD HM II 2015, HLD, CAD, Lt TKA, ICD  ? ?Clinical Impression ?  ?Patient admitted for above and limited by decreased activity tolerance, impaired balance and generalized weakness.  Pt reports limited mobility due to syncope and weakness, but when feeling well able to complete ADLs and mobility using RW with supervision.  He currently requires min-supervision for ADLs, min guard for transfers using RW and mobility in room.  VSS during session with no signs/symptoms of lightheadedness. He will benefit from OT acutely to optimize independence, strength and return to PLOF but anticipate no further needs after dc home.  ?   ? ?Recommendations for follow up therapy are one component of a multi-disciplinary discharge planning process, led by the attending physician.  Recommendations may be updated based on patient status, additional functional criteria and insurance authorization.  ? ?Follow Up Recommendations ? No OT follow up  ?  ?Assistance Recommended at Discharge Intermittent Supervision/Assistance  ?Patient can return home with the following A little help with walking and/or transfers;A little help with bathing/dressing/bathroom;Assistance with cooking/housework;Assist for transportation ? ?  ?Functional Status Assessment ? Patient has had a recent decline in their functional status and demonstrates the ability to make significant improvements in function in a reasonable and predictable amount of time.  ?Equipment Recommendations ? None recommended by OT  ?  ?Recommendations for Other Services   ? ? ?   ?Precautions / Restrictions Precautions ?Precautions: Knee;Fall;Other (comment) ?Precaution Comments: LVAD, watch HR and BP, R TKA 12/03/21 ?Restrictions ?Weight Bearing Restrictions: No ?RLE Weight Bearing: Weight bearing as tolerated  ? ?  ? ?Mobility Bed Mobility ?Overal bed mobility: Modified Independent ?  ?  ?  ?  ?  ?  ?General bed mobility comments: HOB elevated but no assist required ?  ? ?Transfers ?  ?  ?  ?  ?  ?  ?  ?  ?  ?  ?  ? ?  ?Balance Overall balance assessment: Needs assistance ?Sitting-balance support: No upper extremity supported ?Sitting balance-Leahy Scale: Good ?  ?  ?Standing balance support: Bilateral upper extremity supported, During functional activity ?Standing balance-Leahy Scale: Poor ?Standing balance comment: Reliant on RW ?  ?  ?  ?  ?  ?  ?  ?  ?  ?  ?  ?   ? ?ADL either performed or assessed with clinical judgement  ? ?ADL Overall ADL's : Needs assistance/impaired ?  ?  ?Grooming: Min guard;Standing ?  ?  ?  ?  ?  ?Upper Body Dressing : Set up;Sitting ?  ?Lower Body Dressing: Minimal assistance;Sit to/from stand ?  ?Toilet Transfer: Min guard;Ambulation;Rolling walker (2 wheels) ?  ?  ?  ?  ?  ?Functional mobility during ADLs: Min guard;Rolling walker (2 wheels) ?   ? ? ? ?Vision   ?Vision Assessment?: No apparent visual deficits  ?   ?Perception   ?  ?Praxis   ?  ? ?Pertinent Vitals/Pain Pain Assessment ?Pain Assessment: No/denies pain  ? ? ? ?Hand Dominance Right ?  ?Extremity/Trunk Assessment Upper Extremity Assessment ?Upper Extremity Assessment: Generalized weakness ?  ?  Lower Extremity Assessment ?Lower Extremity Assessment: Defer to PT evaluation ?  ?  ?  ?Communication Communication ?Communication: No difficulties ?  ?Cognition Arousal/Alertness: Awake/alert ?Behavior During Therapy: [facility] for tasks assessed/performed ?Overall Cognitive Status: Within Functional Limits for tasks assessed ?  ?  ?  ?  ?  ?  ?  ?  ?  ?  ?  ?  ?  ?  ?  ?  ?  ?  ?  ?General Comments  VSS on RA,  BP stable supine, sitting and standing ? ?  ?Exercises   ?  ?Shoulder Instructions    ? ? ?Home Living Family/patient expects to be discharged to:: Private residence ?Living Arrangements: Spouse/significant other ?Available Help at Discharge: Family;Available 24 hours/day ?Type of Home: House ?Home Access: Stairs to enter ?Entrance Stairs-Number of Steps: 4 ?Entrance Stairs-Rails: None ?Home Layout: Two level;Able to live on main level with bedroom/bathroom ?Alternate Level Stairs-Number of Steps: Walk in shower on the 2nd level.  Bathtub only on the main level. ?  ?Bathroom Shower/Tub: Tub only;Walk-in shower ?  ?Bathroom Toilet: Standard ?  ?  ?Home Equipment: Air cabin crew (4 wheels);BSC/3in1;Rolling Walker (2 wheels) ?  ?  ?  ? ?  ?Prior Functioning/Environment Prior Level of Function : Independent/Modified Independent ?  ?  ?  ?  ?  ?  ?Mobility Comments: Prior to R TKA 12/03/21 pt was IND. Since TKA he has been mod I-SUP with RW. Pt with a couple falls since last admission secondary to lightheadedness. ?ADLs Comments: No assist for ADL/IADL prior to TKA ?  ? ?  ?  ?OT Problem List: Impaired balance (sitting and/or standing);Decreased strength;Decreased activity tolerance ?  ?   ?OT Treatment/Interventions: Self-care/ADL training;Therapeutic activities;Balance training;Patient/family education;DME and/or AE instruction;Therapeutic exercise  ?  ?OT Goals(Current goals can be found in the care plan section) Acute Rehab OT Goals ?Patient Stated Goal: home and get better ?OT Goal Formulation: With patient ?Time For Goal Achievement: 01/12/22 ?Potential to Achieve Goals: Good  ?OT Frequency: Min 2X/week ?  ? ?Co-evaluation   ?  ?  ?  ?  ? ?  ?AM-PAC OT "6 Clicks" Daily Activity     ?Outcome Measure Help from another person eating meals?: None ?Help from another person taking care of personal grooming?: A Little ?Help from another person toileting, which includes using toliet, bedpan, or urinal?: A Little ?Help  from another person bathing (including washing, rinsing, drying)?: A Little ?Help from another person to put on and taking off regular upper body clothing?: A Little ?Help from another person to put on and taking off regular lower body clothing?: A Little ?6 Click Score: 19 ?  ?End of Session Equipment Utilized During Treatment: Rolling walker (2 wheels) ?Nurse Communication: Mobility status ? ?Activity Tolerance: Patient tolerated treatment well ?Patient left: in chair;with call bell/phone within reach ? ?OT Visit Diagnosis: Unsteadiness on feet (R26.81);Muscle weakness (generalized) (M62.81)  ?              ?Time: 1540-0867 ?OT Time Calculation (min): 29 min ?Charges:  OT General Charges ?$OT Visit: 1 Visit ?OT Evaluation ?$OT Eval Moderate Complexity: 1 Mod ? ?Jolaine Artist, OT ?Acute Rehabilitation Services ?Pager [phone] ?Office [phone] ? ? ?Delight Stare ?12/29/2021, 10:46 AM ?

## 2021-12-29 NOTE — Consult Note (Signed)
? ?ELECTROPHYSIOLOGY CONSULT NOTE  ? ? ?Patient ID: Johnny Oneill ?MRN: [id number], DOB/AGE: 1941/07/27 81 y.o. ? ?Admit date: 12/28/2021 ?Date of Consult: 12/29/2021 ? ?Primary Physician: Jolaine Artist, MD ?Primary Cardiologist: None  ?Electrophysiologist: Dr. Curt Bears ? ?Referring Provider: Dr. Haroldine Laws  ? ?Patient Profile: ?Johnny Oneill is a 81 y.o. 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 Haroldine Laws . ? ?HPI:  ?Johnny Oneill is a 81 y.o. male with medical history as above well known to EP team.  ? ?Previously taken off amiodarone due to cerebellar effects.  ? ?VT has been managed on sotalol and mexitil (also for PVCs).  ? ?Admitted 4/20 - 12/19/2021 with syncope. No VT at that time. Hypotension and orthostasis noted. Midodrine increased.  Pt noted to have underlying rhythm in 20-30s and sotalol was stopped to see if this was negatively affecting him.  ? ?Pt presented to [facility] 12/28/2021 with VT with rates of 200-210. MAPs 70-80s. Denied CP or SOB, just felt week . ? ?Underwent emergent DC-CV in ED to NSR.  ? ?Currently, he is feeling OK. No further VT. Feels weak. Denies SOB or orthopnea. Hasn't been up around much yet.  ? ? ?Past Medical History:  ?Diagnosis Date  ? Anxiety   ? Automatic implantable cardioverter-defibrillator in situ   ? CAD (coronary artery disease)   ? S/P stenting of LCX in 2004;  s/p Lat MI 2009 with occlusion of the LCX - treated with Promus stenting. Has total occlusion of the RCA.   ? CHF (congestive heart failure) ([place])   ? Hypercholesteremia   ? Hypertension   ? Hypothyroidism   ? ICD (implantable cardiac defibrillator) in place   ? PROPHYLACTIC      medtronic  ? Inferior MI ([place]) "? date"; 2009  ? Ischemic cardiomyopathy   ? a. 10/09 Echo: Sev LV Dysfxn, inf/lat AK, Mod MR.  ? Liver  disease   ? LVAD (left ventricular assist device) present ([place])   ? Osteoarthritis   ? a. s/p R TKR  ? Pacemaker   ? PVC (premature ventricular contraction)   ? RBBB   ? Shortness of breath   ?  ? ?Surgical History:  ?Past Surgical History:  ?Procedure Laterality Date  ? CARPAL TUNNEL RELEASE Right 05/29/2016  ? Procedure: CARPAL TUNNEL RELEASE;  Surgeon: Earlie Server, MD;  Location: [place];  Service: Orthopedics;  Laterality: Right;  ? CORONARY ANGIOPLASTY WITH STENT PLACEMENT  2004  ? CORONARY ANGIOPLASTY WITH STENT PLACEMENT  07/2008  ? DEFIBRILLATOR  2009  ? ESOPHAGOGASTRODUODENOSCOPY N/A 09/15/2013  ? Procedure: ESOPHAGOGASTRODUODENOSCOPY (EGD);  Surgeon: Ladene Artist, MD;  Location: [facility] ENDOSCOPY;  Service: Endoscopy;  Laterality: N/A;  bedside  ? HEMATOMA EVACUATION Right 11/06/2013  ? Procedure: EVACUATION HEMATOMA;  Surgeon: Gaye Pollack, MD;  Location: [place];  Service: Cardiothoracic;  Laterality: Right;  ? ICD GENERATOR CHANGEOUT N/A 06/28/2019  ? Procedure: ICD GENERATOR CHANGEOUT;  Surgeon: Constance Haw, MD;  Location: [place] CV LAB;  Service: Cardiovascular;  Laterality: N/A;  ? INSERT / REPLACE / REMOVE PACEMAKER  2009  ? INSERTION OF IMPLANTABLE LEFT VENTRICULAR ASSIST DEVICE N/A 09/18/2013  ? Procedure: INSERTION OF IMPLANTABLE LEFT VENTRICULAR ASSIST DEVICE;  Surgeon: Ivin Poot, MD;  Location: [place];  Service: Open Heart  Surgery;  Laterality: N/A;  CIRC ARREST ? ?NITRIC OXIDE  ? INTRA-AORTIC BALLOON PUMP INSERTION N/A 09/14/2013  ? Procedure: INTRA-AORTIC BALLOON PUMP INSERTION;  Surgeon: Jolaine Artist, MD;  Location: [facility] CATH LAB;  Service: Cardiovascular;  Laterality: N/A;  ? INTRAOPERATIVE TRANSESOPHAGEAL ECHOCARDIOGRAM N/A 09/18/2013  ? Procedure: INTRAOPERATIVE TRANSESOPHAGEAL ECHOCARDIOGRAM;  Surgeon: Ivin Poot, MD;  Location: [place];  Service: Open Heart Surgery;  Laterality: N/A;  ? KNEE ARTHROSCOPY    ? bilaterally  ? KNEE ARTHROSCOPY W/ PARTIAL MEDIAL  MENISCECTOMY  12/2002  ? left  ? LEFT VENTRICULAR ASSIST DEVICE    ? RIGHT HEART CATH N/A 07/28/2021  ? Procedure: RIGHT HEART CATH;  Surgeon: Jolaine Artist, MD;  Location: [place] CV LAB;  Service: Cardiovascular;  Laterality: N/A;  ? RIGHT HEART CATHETERIZATION N/A 08/25/2013  ? Procedure: RIGHT HEART CATH;  Surgeon: Jolaine Artist, MD;  Location: [facility] CATH LAB;  Service: Cardiovascular;  Laterality: N/A;  ? RIGHT HEART CATHETERIZATION N/A 09/13/2013  ? Procedure: RIGHT HEART CATH;  Surgeon: Jolaine Artist, MD;  Location: [facility] CATH LAB;  Service: Cardiovascular;  Laterality: N/A;  ? RIGHT HEART CATHETERIZATION N/A 02/08/2014  ? Procedure: RIGHT HEART CATH;  Surgeon: Jolaine Artist, MD;  Location: [facility] CATH LAB;  Service: Cardiovascular;  Laterality: N/A;  ? RIGHT HEART CATHETERIZATION N/A 02/12/2014  ? Procedure: RIGHT HEART CATH;  Surgeon: Jolaine Artist, MD;  Location: [facility] CATH LAB;  Service: Cardiovascular;  Laterality: N/A;  ? TOTAL KNEE ARTHROPLASTY  11/27/11  ? left  ? TOTAL KNEE ARTHROPLASTY  11/27/2011  ? Procedure: TOTAL KNEE ARTHROPLASTY;  Surgeon: Yvette Rack., MD;  Location: [place];  Service: Orthopedics;  Laterality: Left;  left total knee arthroplasty  ? TOTAL KNEE ARTHROPLASTY Right 12/03/2021  ? Procedure: TOTAL KNEE ARTHROPLASTY;  Surgeon: Earlie Server, MD;  Location: [place];  Service: Orthopedics;  Laterality: Right;  ? VIDEO ASSISTED THORACOSCOPY Right 11/06/2013  ? Procedure: VIDEO ASSISTED THORACOSCOPY;  Surgeon: Gaye Pollack, MD;  Location: [place];  Service: Cardiothoracic;  Laterality: Right;  ?  ? ?Medications Prior to Admission  ?Medication Sig Dispense Refill Last Dose  ? aspirin EC 81 MG tablet Take 81 mg by mouth in the morning.   12/28/2021  ? citalopram (CELEXA) 10 MG tablet Take 1 tablet by mouth once daily (Patient taking differently: Take 10 mg by mouth every morning.) 90 tablet 3 12/28/2021  ? Coenzyme Q10 (COQ10) 200 MG CAPS Take 200 mg by mouth in the morning.    12/28/2021  ? Famotidine (PEPCID PO) Take 1 tablet by mouth every morning.   Past Week  ? mexiletine (MEXITIL) 150 MG capsule Take 1 capsule (150 mg total) by mouth 2 (two) times daily. 60 capsule 6 12/28/2021  ? midodrine (PROAMATINE) 10 MG tablet Take 1.5 tablets (15 mg total) by mouth 3 (three) times daily with meals. 135 tablet 3 12/28/2021  ? oxyCODONE (OXY IR/ROXICODONE) 5 MG immediate release tablet Take 1 tablet (5 mg total) by mouth every 12 (twelve) hours as needed for breakthrough pain. 30 tablet 0 Past Week  ? pantoprazole (PROTONIX) 40 MG tablet Take 1 tablet by mouth once daily (Patient taking differently: 40 mg every morning.) 90 tablet 3 12/28/2021  ? rosuvastatin (CRESTOR) 20 MG tablet Take 1 tablet (20 mg total) by mouth daily. (Patient taking differently: Take 20 mg by mouth every morning.) 90 tablet 3 12/28/2021  ? SYNTHROID 175 MCG tablet 1 tablet daily every morning  except half tablet on Sundays (Patient taking differently: Take 87.5-175 mcg by mouth See admin instructions. Take one tablet (175 mcg) by mouth before breakfast Monday thru Saturday, take 1/2 tablet (87.5 mcg) on Sundays before breakfast) 30 tablet 3 12/28/2021  ? traMADol (ULTRAM) 50 MG tablet Take 1-2 tablets (50-100 mg total) by mouth every 6 (six) hours as needed for moderate pain. 40 tablet 5 Past Week  ? warfarin (COUMADIN) 5 MG tablet Take 7.5 mg 4/14 then 5 mg (1 tablet) every day as directed by HF Clinic (Patient taking differently: Take 5 mg by mouth at bedtime. Or as directed by HF Clinic) 135 tablet 11 12/27/2021  ? acetaminophen (TYLENOL) 500 MG tablet Take 1,000 mg by mouth every 6 (six) hours as needed (pain).     ? Cholecalciferol (VITAMIN D) 125 MCG (5000 UT) CAPS Take 5,000 Units by mouth in the morning.     ? furosemide (LASIX) 20 MG tablet Take 1 tablet (20 mg total) by mouth daily as needed (Take 20 mg as daily as needed for wt > 153 lbs). Reported on 01/14/2016 (Patient not taking: Reported on 11/26/2021) 30 tablet 6  Not Taking  ? melatonin 5 MG TABS Take 5 mg by mouth at bedtime.     ? potassium chloride SA (KLOR-CON) 20 MEQ tablet Take 1 tablet (20 mEq total) by mouth daily as needed. Take one potassium pill when you take your lasix.

## 2021-12-29 NOTE — Progress Notes (Signed)
Inpatient Rehab Admissions Coordinator:  ? ?Consult received.  PT recommending outpatient f/u and OT recommending no f/u.  In order to qualify for CIR would need to require intense therapy in at least 2 disciplines.  Pt does not qualify at this time.  Will sign off.   ? ?Shann Medal, PT, DPT ?Admissions Coordinator ?[phone] ?12/29/21  ?12:42 PM ? ?

## 2021-12-29 NOTE — Progress Notes (Addendum)
?Advanced Heart Failure VAD Team Note ? ?PCP-Cardiologist: Dr. Haroldine Laws  ? ?Subjective:   ? ?4/30: admitted w/ VT. Underwent emergent DCCV.   ? ?Maintaining NSR. No VT overnight.  ? ?K 4.7 ?Mg 2.4  ? ?Feeling better but appetite still not great. SBPs ok on midodrine but DBPs are high, low 100s.  ? ? ?LVAD INTERROGATION:  ?HeartMate II LVAD:   ?Flow 4.3 liters/min, speed 9600, power 7.7, PI 5.5.   ? ?Objective:   ? ?Vital Signs:   ?Temp:  [97.7 ?F (36.5 ?C)-98.4 ?F (36.9 ?C)] 98 ?F (36.7 ?C) (05/01 0805) ?Pulse Rate:  [78-206] 87 (05/01 0800) ?Resp:  [11-23] 15 (05/01 0800) ?BP: (106-133)/(62-110) 122/104 (05/01 0800) ?SpO2:  [94 %-100 %] 99 % (05/01 0800) ?Weight:  [71.5 kg] 71.5 kg (04/30 1216) ?Last BM Date :  (PTA) ?Mean arterial Pressure low 100s  ? ?Intake/Output:  ? ?Intake/Output Summary (Last 24 hours) at 12/29/2021 0842 ?Last data filed at 12/29/2021 0800 ?Gross per 24 hour  ?Intake 1390 ml  ?Output 825 ml  ?Net 565 ml  ?  ? ?Physical Exam  ?  ?General:  Well appearing. No resp difficulty ?HEENT: normal ?Neck: supple. JVP not elevated . Carotids 2+ bilat; no bruits. No lymphadenopathy or thyromegaly appreciated. ?Cor: Mechanical heart sounds with LVAD hum present. + zoll pads on chest ?Lungs: clear ?Abdomen: soft, nontender, nondistended. No hepatosplenomegaly. No bruits or masses. Good bowel sounds. ?Driveline: C/D/I; securement device intact and driveline incorporated ?Extremities: no cyanosis, clubbing, rash, edema ?Neuro: alert & orientedx3, cranial nerves grossly intact. moves all 4 extremities w/o difficulty. Affect pleasant ? ? ?Telemetry  ? ?NSR 90s ? ?EKG  ?  ?No new EKG to review  ? ?Labs  ? ?Basic Metabolic Panel: ?Recent Labs  ?Lab 12/28/21 ?1225 12/29/21 ?3419  ?NA 136 134*  ?K 4.1 4.7  ?CL 103 103  ?CO2 24 22  ?GLUCOSE 122* 148*  ?BUN 25* 27*  ?CREATININE 2.43* 2.24*  ?CALCIUM 8.8* 8.8*  ?MG 1.8 2.4  ? ? ?Liver Function Tests: ?Recent Labs  ?Lab 12/28/21 ?1225  ?AST 22  ?ALT 12  ?ALKPHOS 98   ?BILITOT 0.9  ?PROT 7.0  ?ALBUMIN 3.2*  ? ?No results for input(s): LIPASE, AMYLASE in the last 168 hours. ?No results for input(s): AMMONIA in the last 168 hours. ? ?CBC: ?Recent Labs  ?Lab 12/28/21 ?1225 12/29/21 ?3790  ?WBC 7.2 6.8  ?NEUTROABS 5.4  --   ?HGB 9.1* 8.6*  ?HCT 29.3* 27.0*  ?MCV 95.8 93.4  ?PLT 226 214  ? ? ?INR: ?Recent Labs  ?Lab 12/28/21 ?1225 12/29/21 ?2409  ?INR 3.5* 3.5*  ? ? ?Other results: ?EKG:  ? ? ?Imaging  ? ?No results found. ? ? ?Medications:   ? ? ?Scheduled Medications: ? aspirin EC  81 mg Oral q AM  ? Chlorhexidine Gluconate Cloth  6 each Topical Daily  ? citalopram  10 mg Oral q morning  ? levothyroxine  175 mcg Oral Once per day on Mon Tue Wed Thu Fri Sat  ? [START ON 01/04/2022] levothyroxine  87.5 mcg Oral Once per day on Sun  ? mexiletine  150 mg Oral BID  ? midodrine  15 mg Oral TID WC  ? pantoprazole  40 mg Oral Daily  ? rosuvastatin  20 mg Oral q morning  ? traZODone  50 mg Oral QHS  ? Warfarin - Pharmacist Dosing Inpatient   Does not apply [id number]  ? ? ?Infusions: ? ? ?PRN Medications: ?acetaminophen, ondansetron (ZOFRAN)  IV, traMADol ? ? ?Patient Profile  ? ?80 y.o. 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 Presenting with recurrent syncopal episodes. Found to be in VT.  Underwent emergent DC-CV in ED ? ?Assessment/Plan:   ? 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  ?- Off amio due to cerebellar side effects.  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  ?- Seen in ER 06/16/21 with recurrent VT. DC-CV x 1 ?- PVCs improved on mexilitene. Continue mexilitene 150 bid ?- Sotalol stopped last week due to bradycardia  ?- s/p emergent DC-CV in ER on 12/28/21 ?- Restart sotalol 80 mg q48 hrs (due to renal fx)  ?- Keep K > 4.0 Mg > 2.0 ?- Will ask EP to see  ?  ?2. Chronic systolic HF:  ?- ICM, EF 76-72% s/p LVAD HM II implant 08/2013 for DT.   ?- Echo 3/18 EF 10-15% mild AI. Mod RV dysfunction. LVAD cannula OK ?- has  struggled with RV failure however RHC 11/22 looked good (despite evidence of RVF on LE dopplers showing reflux ?- NYHA III. Given IVFs in ED, volume felt low  ?- volume status ok on exam today  ?- Remains off all diuretics including spiro ?- Failed Jardiance due to volume depletion ?- He has not tolerated ACE-I or amlodipine or b-blocker in the past. ?  ?3. LVAD: HMII 08/2013 for DT. ?- VAD interrogated personally. Parameters stable. ?- Admitted in May 2018 with elevated LDH with bival.  ?- Continue aspirin and coumadin.  ?- Hgb 9.6, stable  ?- LDH 254>>324  ?- DL site looks good ?- On warfarin INR 3.5 today Goal 2.0-3.0. Dosing per PharmD ?  ?4. 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  ?5. Painful left 2nd toe ?- uric acid normal, 6.7  ?  ?8. Atrial flutter, recurrent ?- patient paced out of AFL in 6/22. Atrial therapies enabled  ?- continue sotalol (on for VT). QTs slightly prolonged but stable ?- AC as above ?- may need to consider ablation if recurs ?  ?9. Chronic anticoagulation: INR goal 2.0-3.0.    ?- INR 3.5 today See above ?- Continue ASA 81 mg for LVAD + warfarin.  ?  ?10. HTN:  ?- MAPs high but + orthostasis  ?- continue midodrine  ?  ?11. Hypothyroidism:  ?- Followed by Dr Dwyane Dee. Stable. No change  ?  ?12. Hyperlipidemia:  ?- Continue Crestor. Zetia stopped due to cost.  ?- No change  ?  ?13. CAD ?- no s/s ischemia ?- continue Crestor ?  ?14. CKD IV ?- Creatinine baseline 1.7-2.0 ?- creatinine increasing recently has been low 2s ?- SCr this admit, 2.43>>2.24  ?- failed Jardiance ?- Has outpatient Nephrology referral ?  ?15. Debility ?- has been struggling since TKA ?- unable to do ADLs now ?- suspect he will need CIR ?  ? ?I reviewed the LVAD parameters from today, and compared the results to the patient's prior recorded data.  No programming changes were made.  The LVAD is functioning within specified parameters.  The patient performs LVAD self-test daily.  LVAD interrogation was  negative for any significant power changes, alarms or PI events/speed drops.  LVAD equipment check completed and is in good working order.  Back-up equipment present.   LVAD education done on emergency procedures and precautions and reviewed exit site care. ? ?Length of Stay: 1 ? ?Lyda Jester,  PA-C ?12/29/2021, 8:42 AM ? ?VAD Team --- VAD ISSUES ONLY--- ?Pager [phone] (7am - 7am) ? ?Advanced Heart Failure Team  ?Pager [phone] (M-F; 7a - 5p)  ?Please contact [facility] for night-coverage after hours (5p -7a ) and weekends on [email or web address] ? ?Patient seen and examined with the above-signed Advanced Practice Provider and/or Housestaff. I personally reviewed laboratory data, imaging studies and relevant notes. I independently examined the patient and formulated the important aspects of the plan. I have edited the note to reflect any of my changes or salient points. I have personally discussed the plan with the patient and/or family. ? ?No further VT overnight. Feels weak. Denies SOB, orthopnea or PND.  ? ?Scr 2.4 -> 2 after IVF ? ?General:  NAD.  ?HEENT: normal  ?Neck: supple. JVP not elevated.  Carotids 2+ bilat; no bruits. No lymphadenopathy or thryomegaly appreciated. ?Cor: LVAD hum.  ?Lungs: Clear. ?Abdomen: soft, nontender, non-distended. No hepatosplenomegaly. No bruits or masses. Good bowel sounds. Driveline site clean. Anchor in place.  ?Extremities: no cyanosis, clubbing, rash. Warm no edema  ?Neuro: alert & oriented x 3. No focal deficits. Moves all 4 without problem  ? ?No further VT. Will restart sotalol at 80. EP has been consulted as well. BP improved. Renal function slightly better. Very weak. Will consult CIR.  ? ?VAD interrogated personally. Parameters stable. ? ?Glori Bickers, MD  ?9:24 AM ? ? ? ? ?  ?

## 2021-12-29 NOTE — Evaluation (Signed)
Physical Therapy Evaluation Patient Details Name: Johnny Oneill MRN: [id number] DOB: [birth year] Today's Date: 12/29/2021  History of Present Illness  Pt is a 81 y.o. male who presented 4/30 with weakness. Found in VT 200-210 bpm, underwent emergent DV-CV in ED.  Of note, pt admitted 12/01/21 - 12/12/21 for Rt TKA, complicated by orthostatic hypotension and again 4/20-21 with hypotension. PMhx: CKD, CHF, HTN, LVAD HM II 2015, HLD, CAD, Lt TKA, ICD  Clinical Impression  Patient presents with generalized weakness, decreased AROM right knee (recent TKA 12/03/21), impaired balance and impaired mobility s/p above. Pt lives at home with wife and has had a hard time walking recently due to episodes of syncope and lightheadedness. Just finished HHPT and transitioned to OPPT which was supposed to start today, 12/29/21 for his knee. Today, pt tolerated transfers and gait training with Min guard assist and use of RW for support. No dizziness during mobility and VSS on RA. Encouraged increasing activity and walking with nursing while in the hospital. Reviewed no pillow under knee and placed pillow under right ankle for optimal positioning of knee into extension. Encouraged HEP. Will follow acutely to maximize independence and mobility prior to return home.     Recommendations for follow up therapy are one component of a multi-disciplinary discharge planning process, led by the attending physician.  Recommendations may be updated based on patient status, additional functional criteria and insurance authorization.  Follow Up Recommendations Outpatient PT    Assistance Recommended at Discharge Frequent or constant Supervision/Assistance  Patient can return home with the following  A little help with walking and/or transfers;A little help with bathing/dressing/bathroom;Assistance with cooking/housework;Assist for transportation;Help with stairs or ramp for entrance    Equipment Recommendations None recommended by PT   Recommendations for Other Services       Functional Status Assessment Patient has had a recent decline in their functional status and demonstrates the ability to make significant improvements in function in a reasonable and predictable amount of time.     Precautions / Restrictions Precautions Precautions: Knee;Fall;Other (comment) Precaution Booklet Issued: No Precaution Comments: LVAD, watch HR and BP, R TKA 12/03/21 Restrictions Weight Bearing Restrictions: Yes RLE Weight Bearing: Weight bearing as tolerated      Mobility  Bed Mobility Overal bed mobility: Modified Independent Bed Mobility: Supine to Sit, Sit to Supine     Supine to sit: Modified independent (Device/Increase time), HOB elevated Sit to supine: Modified independent (Device/Increase time), HOB elevated   General bed mobility comments: HOB elevated but no assist required    Transfers Overall transfer level: Needs assistance Equipment used: Rolling walker (2 wheels) Transfers: Sit to/from Stand Sit to Stand: Min guard           General transfer comment: Min guard for safety. Stood from EOB x1, slow to rise. No dizziness.    Ambulation/Gait Ambulation/Gait assistance: Min guard, Min assist Gait Distance (Feet): 45 Feet Assistive device: Rolling walker (2 wheels) Gait Pattern/deviations: Decreased stride length, Decreased stance time - right, Decreased weight shift to right, Antalgic, Step-through pattern, Trunk flexed, Decreased step length - left Gait velocity: reduced Gait velocity interpretation: <1.31 ft/sec, indicative of household ambulator   General Gait Details: Pt with slowed, antalgic gait pattern with decreased Rt stance time and thus L step length secondary to recent R TKA. No LOB or lightheadedness noted, min guard for safety.  Stairs            Wheelchair Mobility    Modified Rankin (Stroke Patients  Only)       Balance Overall balance assessment: Needs  assistance Sitting-balance support: Feet supported, No upper extremity supported Sitting balance-Leahy Scale: Good     Standing balance support: During functional activity Standing balance-Leahy Scale: Poor Standing balance comment: Reliant on RW                             Pertinent Vitals/Pain Pain Assessment Pain Assessment: Faces Faces Pain Scale: Hurts little more Pain Location: Rt knee with AROM Pain Descriptors / Indicators: Grimacing, Discomfort Pain Intervention(s): Monitored during session, Repositioned    Home Living Family/patient expects to be discharged to:: Private residence Living Arrangements: Spouse/significant other Available Help at Discharge: Family;Available 24 hours/day Type of Home: House Home Access: Stairs to enter Entrance Stairs-Rails: None Entrance Stairs-Number of Steps: 4 Alternate Level Stairs-Number of Steps: Walk in shower on the 2nd level.  Bathtub only on the main level. Home Layout: Two level;Able to live on main level with bedroom/bathroom Home Equipment: Shower seat;Rollator (4 wheels);BSC/3in1;Rolling Walker (2 wheels) Additional Comments: Probably looking at sink baths until he can manage stairs.    Prior Function Prior Level of Function : Independent/Modified Independent             Mobility Comments: Prior to R TKA 12/03/21 pt was IND. Since TKA he has been mod I-SUP with RW. Pt with a couple falls since last admission secondary to lightheadedness. ADLs Comments: No assist for ADL/IADL prior to TKA     Hand Dominance   Dominant Hand: Right    Extremity/Trunk Assessment   Upper Extremity Assessment Upper Extremity Assessment: Defer to OT evaluation    Lower Extremity Assessment Lower Extremity Assessment: RLE deficits/detail;Generalized weakness RLE Deficits / Details: Noted limited knee flexion AROM, achiveing ~80 degrees flexion, lacking full knee extension.  s/p Rt TKA 12/03/21.    Cervical / Trunk  Assessment Cervical / Trunk Assessment: Normal  Communication   Communication: No difficulties  Cognition Arousal/Alertness: Awake/alert Behavior During Therapy: WFL for tasks assessed/performed Overall Cognitive Status: Within Functional Limits for tasks assessed                                          General Comments General comments (skin integrity, edema, etc.): VSS on RA. BP stable.    Exercises     Assessment/Plan    PT Assessment Patient needs continued PT services  PT Problem List Decreased strength;Decreased range of motion;Decreased mobility;Decreased activity tolerance;Decreased balance;Decreased knowledge of use of DME;Pain;Cardiopulmonary status limiting activity       PT Treatment Interventions DME instruction;Therapeutic exercise;Gait training;Stair training;Functional mobility training;Therapeutic activities;Patient/family education;Balance training;Neuromuscular re-education    PT Goals (Current goals can be found in the Care Plan section)  Acute Rehab PT Goals Patient Stated Goal: to get back to independence PT Goal Formulation: With patient Time For Goal Achievement: 01/12/22 Potential to Achieve Goals: Good    Frequency Min 5X/week     Co-evaluation               AM-PAC PT "6 Clicks" Mobility  Outcome Measure Help needed turning from your back to your side while in a flat bed without using bedrails?: None Help needed moving from lying on your back to sitting on the side of a flat bed without using bedrails?: None Help needed moving to and from a bed to  a chair (including a wheelchair)?: A Little Help needed standing up from a chair using your arms (e.g., wheelchair or bedside chair)?: A Little Help needed to walk in hospital room?: A Little Help needed climbing 3-5 steps with a railing? : A Little 6 Click Score: 20    End of Session Equipment Utilized During Treatment: Gait belt Activity Tolerance: Patient tolerated  treatment well Patient left: in chair;with call bell/phone within reach Nurse Communication: Mobility status PT Visit Diagnosis: Other abnormalities of gait and mobility (R26.89);Muscle weakness (generalized) (M62.81)    Time: 4098-1191 PT Time Calculation (min) (ACUTE ONLY): 29 min   Charges:   PT Evaluation $PT Eval Moderate Complexity: 1 Mod          Vale Haven, PT, DPT Acute Rehabilitation Services Secure chat preferred Office [phone]     Blake Divine A Jaylanni Eltringham 12/29/2021, 12:25 PM

## 2021-12-29 NOTE — Progress Notes (Signed)
ANTICOAGULATION CONSULT NOTE ?Pharmacy Consult for warfarin  ?Indication: LVAD ? ?Allergies  ?Allergen Reactions  ? Ace Inhibitors   ?  Hypotension and elevated creatinine  ? Amiodarone Other (See Comments)  ?  Cerebellar side effects  ? Diovan [Valsartan] Other (See Comments)  ?  Hypotension at low dose, hyperkalemia  ? Lipitor [Atorvastatin Calcium] Other (See Comments)  ?  Muscle pain  ? Jardiance [Empagliflozin] Other (See Comments)  ?  Unsure of exact side effect  ? Norvasc [Amlodipine] Other (See Comments)  ?  Put patient to sleep  ? ? ?Patient Measurements: ?Height: 5\' 7"  (170.2 cm) ?Weight: 71.5 kg (157 lb 10.1 oz) ?IBW/kg (Calculated) : 66.1 ? Heparin dosing weight: 71.1 kg ? ?Vital Signs: ?Temp: 98 ?F (36.7 ?C) (05/01 0805) ?Temp Source: Oral (05/01 0805) ?BP: 122/104 (05/01 0800) ?Pulse Rate: 87 (05/01 0800) ? ?Labs: ?Recent Labs  ?  12/28/21 ?1225 12/29/21 ?4239  ?HGB 9.1* 8.6*  ?HCT 29.3* 27.0*  ?PLT 226 214  ?LABPROT 35.1* 35.0*  ?INR 3.5* 3.5*  ?CREATININE 2.43* 2.24*  ? ? ? ?Estimated Creatinine Clearance: 25.4 mL/min (A) (by C-G formula based on SCr of 2.24 mg/dL (H)). ? ? 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 Pharmacy consulted to dose warfarin.  ? ?INR 3.5 > goal despite lower warfarin dose last pm  ?Admit with VT> s/p DCCV in ED.   ?Hgb 9 stable no bleeding noted.  LDH 300s  up slightly from baseline 200s ?PTA warfarin dose: 5 mg daily ? 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  ?Plan:  ?Warfarin 2.5mg  again today  ?Daily INR ? ? ?Bonnita Nasuti Pharm.D. CPP, BCPS ?Clinical Pharmacist ?[phone] ?12/29/2021 9:00 AM  ? ? ?

## 2021-12-30 LAB — BASIC METABOLIC PANEL
Anion gap: 9 (ref 5–15)
BUN: 35 mg/dL — ABNORMAL HIGH (ref 8–23)
CO2: 23 mmol/L (ref 22–32)
Calcium: 8.8 mg/dL — ABNORMAL LOW (ref 8.9–10.3)
Chloride: 105 mmol/L (ref 98–111)
Creatinine, Ser: 2.34 mg/dL — ABNORMAL HIGH (ref 0.61–1.24)
GFR, Estimated: 28 mL/min — ABNORMAL LOW (ref >60)
Glucose, Bld: 102 mg/dL — ABNORMAL HIGH (ref 70–99)
Potassium: 4.1 mmol/L (ref 3.5–5.1)
Sodium: 137 mmol/L (ref 135–145)

## 2021-12-30 LAB — CBC
HCT: 26 % — ABNORMAL LOW (ref 39.0–52.0)
Hemoglobin: 8.4 g/dL — ABNORMAL LOW (ref 13.0–17.0)
MCH: 29.3 pg (ref 26.0–34.0)
MCHC: 32.3 g/dL (ref 30.0–36.0)
MCV: 90.6 fL (ref 80.0–100.0)
Platelets: 213 10*3/uL (ref 150–400)
RBC: 2.87 MIL/uL — ABNORMAL LOW (ref 4.22–5.81)
RDW: 14.9 % (ref 11.5–15.5)
WBC: 7.5 10*3/uL (ref 4.0–10.5)
nRBC: 0 % (ref 0.0–0.2)

## 2021-12-30 LAB — PROTIME-INR
INR: 3.3 — ABNORMAL HIGH (ref 0.8–1.2)
Prothrombin Time: 33.6 seconds — ABNORMAL HIGH (ref 11.4–15.2)

## 2021-12-30 LAB — MAGNESIUM: Magnesium: 2.2 mg/dL (ref 1.7–2.4)

## 2021-12-30 LAB — LACTATE DEHYDROGENASE: LDH: 301 U/L — ABNORMAL HIGH (ref 98–192)

## 2021-12-30 MED ORDER — MIDODRINE HCL 5 MG PO TABS
5.0000 mg | ORAL_TABLET | Freq: Three times a day (TID) | ORAL | Status: DC
  Administered 2021-12-30 – 2022-01-02 (×10): 5 mg via ORAL
  Filled 2021-12-30 (×10): qty 1

## 2021-12-30 MED ORDER — WARFARIN SODIUM 2 MG PO TABS
2.0000 mg | ORAL_TABLET | Freq: Once | ORAL | Status: AC
  Administered 2021-12-30: 2 mg via ORAL
  Filled 2021-12-30: qty 1

## 2021-12-30 NOTE — Progress Notes (Signed)
Post dose EKG reviewed    ? ?Shows borderline QTc at ~490+ ms when measured manually and corrected for his wide QRS.  Given he has an ICD and VAD, likely OK to continue without adjustment.  ? ?Continue  sotalol 80 mg  daily.  ? ?Shirley Friar, PA-C  ?Pager: [phone]  ?12/30/2021 2:31 PM  ? ?

## 2021-12-30 NOTE — Progress Notes (Signed)
ANTICOAGULATION CONSULT NOTE ?Pharmacy Consult for warfarin  ?Indication: LVAD ? ?Allergies  ?Allergen Reactions  ? Ace Inhibitors   ?  Hypotension and elevated creatinine  ? Amiodarone Other (See Comments)  ?  Cerebellar side effects  ? Diovan [Valsartan] Other (See Comments)  ?  Hypotension at low dose, hyperkalemia  ? Lipitor [Atorvastatin Calcium] Other (See Comments)  ?  Muscle pain  ? Jardiance [Empagliflozin] Other (See Comments)  ?  Unsure of exact side effect  ? Norvasc [Amlodipine] Other (See Comments)  ?  Put patient to sleep  ? ? ?Patient Measurements: ?Height: 5\' 7"  (170.2 cm) ?Weight: 69.9 kg (154 lb 1.6 oz) ?IBW/kg (Calculated) : 66.1 ? Heparin dosing weight: 71.1 kg ? ?Vital Signs: ?Temp: 97.6 ?F (36.4 ?C) (05/02 0756) ?Temp Source: Oral (05/02 0756) ?BP: 119/92 (05/02 0800) ?Pulse Rate: 80 (05/02 0800) ? ?Labs: ?Recent Labs  ?  12/28/21 ?1225 12/29/21 ?9735 12/30/21 ?3299 12/30/21 ?2426  ?HGB 9.1* 8.6* 8.4*  --   ?HCT 29.3* 27.0* 26.0*  --   ?PLT 226 214 213  --   ?LABPROT 35.1* 35.0*  --  33.6*  ?INR 3.5* 3.5*  --  3.3*  ?CREATININE 2.43* 2.24* 2.34*  --   ? ? ? ?Estimated Creatinine Clearance: 24.3 mL/min (A) (by C-G formula based on SCr of 2.34 mg/dL (H)). ? ? 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 Pharmacy consulted to dose warfarin.  ? ?Admit with VT> s/p DCCV in ED.   ? ?INR 3.3 > goal despite lower warfarin dose last night.  ?Hgb 8.4 trending down, no bleeding noted. LDH 300s up slighty from baseline 200s but now trending down.  ?PTA warfarin dose: 5 mg daily ? 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  ?Plan:  ?Warfarin 2mg  today ?Daily INR ? ?Erin Hearing PharmD., BCPS ?Clinical Pharmacist ?12/30/2021 9:49 AM ? ?

## 2021-12-30 NOTE — Progress Notes (Signed)
LVAD Coordinator Rounding Note: ? ?Admitted 12/28/21 from ED to Dr. Haroldine Laws service for VT. ? 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 Darcey [name] under bridge to transplant criteria. Patient was removed from transplant list 05/2016 per his choice. He is currently Destination Therapy LVAD.  ? ?Pts wife paged VAD pager over the weekend w/ c/o weakness and low flows. Pt presented to ED in VT. Cardioversion performed in ED by Dr Haroldine Laws ? ?Pt sitting up in the recliner after walking in the hallway with PT. Reports feeling a little lightheaded after his walk. Pt was denied for inpatient rehab. Pt reports he is feeling better other than pain in his right knee.  ? ?Maintaining NSR. No further VT. EP following EKGs w/ sotalol loading. Currently taking Sotalol 80 mg daily.  ? ?BP elevated this morning. Midodrine dosing decreased to 5 mg TID.  ? ?Vital signs       ?Temp: 97.6 ?HR: 80   ?Doppler Pressure: 120     ?Automatic BP: 148/107 (120)     ?O2 Sat: 99% on RA ?Wt:  157.6>154.1 lbs ? ?LVAD interrogation reveals:  ?Speed: 9600    ?Flow: 3.7    ?Power: 5.2   ?PI: 7.2     ? ?Alarms: none ?Events: 10 - 20 daily ? ?Fixed speed:  9600 ?Low speed limit: 9000 ? ?Drive Line:  Drive line is being maintained weekly by [name], his wife. Existing VAD dressing C/D/I Sorbaview dressing and Silverlon patch intact. Exit site healed and incorporated, the velour is fully implanted at exit site. Pt denies fever or chills. Weekly dressing changes per [facility] nurse or caregiver. Next dressing change due 01/01/22. ? ?Labs:  ?LDH trend: 324>301 ? ?INR trend: 3.5>3.3 ? ?Anticoagulation Plan: ?-INR Goal:  2.5 - 3.0 ?-ASA Dose: 81 mg daily ? ? ?Device: [employer] dual ?- Therapies: on 231 bpm ?- Pacing:  AAI<=>DDD 80 bpm ?- Last check: in ED per [employer] rep on 12/18/21 - rate response turned on ? ? ?Plan/Recommendations:  ?Call VAD Coordinator with any VAD equipment or drive line issues. ?Weekly dressing changes per [facility] nurse or caregiver.  ? ?Emerson Monte  RN ?VAD Coordinator  ?Office: [phone]  ?24/7 Pager: [phone]  ? ? ?

## 2021-12-30 NOTE — Progress Notes (Signed)
Physical Therapy Treatment ?Patient Details ?Name: Johnny Oneill ?MRN: [id number] ?DOB: Aug 19, 1941 ?Today's Date: 12/30/2021 ? ? ?History of Present Illness Pt is a 81 y.o. male who presented 4/30 with weakness. Found in VT 200-210 bpm, underwent emergent DV-CV in ED.  Of note, pt admitted 12/01/21 - 12/12/21 for Rt TKA, complicated by orthostatic hypotension and again 4/20-21 with hypotension. PMhx: CKD, CHF, HTN, LVAD HM II 2015, HLD, CAD, Lt TKA, ICD ? ?  ?PT Comments  ? ? Patient progressing well towards PT goals. Continues to report pain in right knee esp with mobility despite being pre medicated. Session focused on progressive ambulation and there ex. Improved ambulation distance today with min guard assist; pt with episode of lightheadedness and presyncope needing to sit quickly during gait. HR up to 136 bpm with PVCs in chair while doing knee flexion AROM. Encouraged walking again later with nursing and proper positioning of knee in bed. Encouraged ice for pain management/swelling as well. Will follow. ? ?   ?Recommendations for follow up therapy are one component of a multi-disciplinary discharge planning process, led by the attending physician.  Recommendations may be updated based on patient status, additional functional criteria and insurance authorization. ? ?Follow Up Recommendations ? Outpatient PT ?  ?  ?Assistance Recommended at Discharge Frequent or constant Supervision/Assistance  ?Patient can return home with the following A little help with walking and/or transfers;A little help with bathing/dressing/bathroom;Assistance with cooking/housework;Assist for transportation;Help with stairs or ramp for entrance ?  ?Equipment Recommendations ? None recommended by PT  ?  ?Recommendations for Other Services   ? ? ?  ?Precautions / Restrictions Precautions ?Precautions: Knee;Fall;Other (comment) ?Precaution Booklet Issued: No ?Precaution Comments: LVAD, watch HR and BP, R TKA 12/03/21 ?Restrictions ?Weight  Bearing Restrictions: Yes ?RLE Weight Bearing: Weight bearing as tolerated  ?  ? ?Mobility ? Bed Mobility ?Overal bed mobility: Needs Assistance ?Bed Mobility: Supine to Sit ?  ?  ?Supine to sit: Modified independent (Device/Increase time), HOB elevated ?  ?  ?General bed mobility comments: No assist needed. ?  ? ?Transfers ?Overall transfer level: Needs assistance ?Equipment used: Rolling walker (2 wheels) ?Transfers: Sit to/from Stand ?Sit to Stand: Min assist ?  ?  ?  ?  ?  ?General transfer comment: Stood from [employer], from chair x2 with pt pulling up on RW. No dizziness. Transferred to chair post ambulation. ?  ? ?Ambulation/Gait ?Ambulation/Gait assistance: Min guard ?Gait Distance (Feet): 190 Feet (2 seated rest breaks) ?Assistive device: Rolling walker (2 wheels) ?Gait Pattern/deviations: Decreased stride length, Decreased stance time - right, Decreased weight shift to right, Antalgic, Step-through pattern, Trunk flexed, Decreased step length - left ?Gait velocity: reduced ?  ?  ?General Gait Details: Pt with slowed, antalgic gait pattern with decreased Rt stance time and thus Lft step length secondary to R TKA. Episode of lightheadedness, + pallor, needing to sit quickly to prevent passing out (tremor in LUE happens before passing out per wife), min guard for safety. 2 seated rest breaks. ? ? ?Stairs ?  ?  ?  ?  ?  ? ? ?Wheelchair Mobility ?  ? ?Modified Rankin (Stroke Patients Only) ?  ? ? ?  ?Balance Overall balance assessment: Needs assistance ?Sitting-balance support: Feet supported, No upper extremity supported ?Sitting balance-Leahy Scale: Good ?  ?  ?Standing balance support: During functional activity ?Standing balance-Leahy Scale: Poor ?Standing balance comment: Reliant on RW ?  ?  ?  ?  ?  ?  ?  ?  ?  ?  ?  ?  ? ?  ?  Cognition Arousal/Alertness: Awake/alert ?Behavior During Therapy: [facility] for tasks assessed/performed ?Overall Cognitive Status: Within Functional Limits for tasks assessed ?  ?  ?  ?  ?   ?  ?  ?  ?  ?  ?  ?  ?  ?  ?  ?  ?  ?  ?  ? ?  ?Exercises Total Joint Exercises ?Knee Flexion: AROM, Right, 5 reps, Seated ? ?  ?General Comments General comments (skin integrity, edema, etc.): Wife present during session. HR up to 136 bpm during there ex in chair, PVCs. ?  ?  ? ?Pertinent Vitals/Pain Pain Assessment ?Pain Assessment: 0-10 ?Pain Score: 7  ?Pain Location: Rt knee with AROM ?Pain Descriptors / Indicators: Grimacing, Discomfort ?Pain Intervention(s): Repositioned, Monitored during session, Premedicated before session  ? ? ?Home Living   ?  ?  ?  ?  ?  ?  ?  ?  ?  ?   ?  ?Prior Function    ?  ?  ?   ? ?PT Goals (current goals can now be found in the care plan section) Progress towards PT goals: Progressing toward goals ? ?  ?Frequency ? ? ? Min 5X/week ? ? ? ?  ?PT Plan Current plan remains appropriate  ? ? ?Co-evaluation   ?  ?  ?  ?  ? ?  ?AM-PAC PT "6 Clicks" Mobility   ?Outcome Measure ? Help needed turning from your back to your side while in a flat bed without using bedrails?: None ?Help needed moving from lying on your back to sitting on the side of a flat bed without using bedrails?: None ?Help needed moving to and from a bed to a chair (including a wheelchair)?: A Little ?Help needed standing up from a chair using your arms (e.g., wheelchair or bedside chair)?: A Little ?Help needed to walk in hospital room?: A Little ?Help needed climbing 3-5 steps with a railing? : A Little ?6 Click Score: 20 ? ?  ?End of Session Equipment Utilized During Treatment: Gait belt ?Activity Tolerance: Patient tolerated treatment well;Treatment limited secondary to medical complications (Comment) (presyncope) ?Patient left: in chair;with call bell/phone within reach;with family/visitor present;with nursing/sitter in room ?Nurse Communication: Mobility status ?PT Visit Diagnosis: Other abnormalities of gait and mobility (R26.89);Muscle weakness (generalized) (M62.81) ?Pain - Right/Left: Right ?Pain - part of body:  Knee ?  ? ? ?Time: 2595-6387 ?PT Time Calculation (min) (ACUTE ONLY): 29 min ? ?Charges:  $Gait Training: 8-22 mins ?$Therapeutic Activity: 8-22 mins          ?          ? ?Marisa Severin, PT, DPT ?Acute Rehabilitation Services ?Secure chat preferred ?Office [phone] ? ? ? ? ? ?[place] ?12/30/2021, 4:36 PM ? ?

## 2021-12-30 NOTE — Progress Notes (Addendum)
?Advanced Heart Failure VAD Team Note ? ?PCP-Cardiologist: Dr. Haroldine Laws  ? ?Subjective:   ? ?4/30: admitted w/ VT. Underwent emergent DCCV.   ?5/1: Sotalol resumed. EP following  ? ?Maintaining NSR. No further VT. EP following EKGs w/ sotalol loading.  ? ?K 4.1 ?Mg 2.2  ? ?MAPs remain elevated in the low 100s.  ? ?Ambulated today. Did ok during walk. Felt a little lightheaded when he returned.  ? ?Currently resting in chair. No current complaints.  ? ? ?LVAD INTERROGATION:  ?HeartMate II LVAD:   ?Flow 3.8 liters/min, speed 9600, power 7.3, PI 5.3.  No PI events  ? ?Objective:   ? ?Vital Signs:   ?Temp:  [97.6 ?F (36.4 ?C)-98.4 ?F (36.9 ?C)] 97.6 ?F (36.4 ?C) (05/02 0756) ?Pulse Rate:  [77-91] 80 (05/02 0800) ?Resp:  [11-22] 18 (05/02 0800) ?BP: (105-142)/(58-109) 119/92 (05/02 0800) ?SpO2:  [84 %-100 %] 99 % (05/02 0800) ?Weight:  [69.9 kg] 69.9 kg (05/02 0500) ?Last BM Date : 12/29/21 ?Mean arterial Pressure low 100s  ? ?Intake/Output:  ? ?Intake/Output Summary (Last 24 hours) at 12/30/2021 0942 ?Last data filed at 12/30/2021 0800 ?Gross per 24 hour  ?Intake 593 ml  ?Output 1500 ml  ?Net -907 ml  ?  ? ?Physical Exam  ?  ?General:  Well appearing, sitting up in chair. No resp difficulty ?HEENT: normal ?Neck: supple. JVP not elevated . Carotids 2+ bilat; no bruits. No lymphadenopathy or thyromegaly appreciated. ?Cor: Mechanical heart sounds with LVAD hum present. + zoll pads on chest ?Lungs: clear. No wheezing  ?Abdomen: soft, nontender, nondistended. No hepatosplenomegaly. No bruits or masses. Good bowel sounds. ?Driveline: C/D/I; securement device intact and driveline incorporated ?Extremities: no cyanosis, clubbing, rash, edema. Legs warm  ?Neuro: alert & orientedx3, cranial nerves grossly intact. moves all 4 extremities w/o difficulty. Affect pleasant ? ? ?Telemetry  ? ?NSR 90s. No further VT  ? ?EKG  ?  ?No new EKG to review  ? ?Labs  ? ?Basic Metabolic Panel: ?Recent Labs  ?Lab 12/28/21 ?1225 12/29/21 ?6440  12/30/21 ?0329  ?NA 136 134* 137  ?K 4.1 4.7 4.1  ?CL 103 103 105  ?CO2 24 22 23   ?GLUCOSE 122* 148* 102*  ?BUN 25* 27* 35*  ?CREATININE 2.43* 2.24* 2.34*  ?CALCIUM 8.8* 8.8* 8.8*  ?MG 1.8 2.4 2.2  ? ? ?Liver Function Tests: ?Recent Labs  ?Lab 12/28/21 ?1225  ?AST 22  ?ALT 12  ?ALKPHOS 98  ?BILITOT 0.9  ?PROT 7.0  ?ALBUMIN 3.2*  ? ?No results for input(s): LIPASE, AMYLASE in the last 168 hours. ?No results for input(s): AMMONIA in the last 168 hours. ? ?CBC: ?Recent Labs  ?Lab 12/28/21 ?1225 12/29/21 ?3474 12/30/21 ?0329  ?WBC 7.2 6.8 7.5  ?NEUTROABS 5.4  --   --   ?HGB 9.1* 8.6* 8.4*  ?HCT 29.3* 27.0* 26.0*  ?MCV 95.8 93.4 90.6  ?PLT 226 214 213  ? ? ?INR: ?Recent Labs  ?Lab 12/28/21 ?1225 12/29/21 ?2595 12/30/21 ?6387  ?INR 3.5* 3.5* 3.3*  ? ? ?Other results: ?EKG:  ? ? ?Imaging  ? ?No results found. ? ? ?Medications:   ? ? ?Scheduled Medications: ? aspirin EC  81 mg Oral q AM  ? Chlorhexidine Gluconate Cloth  6 each Topical Daily  ? citalopram  10 mg Oral q morning  ? levothyroxine  175 mcg Oral Once per day on Mon Tue Wed Thu Fri Sat  ? [START ON 01/04/2022] levothyroxine  87.5 mcg Oral Once per day on Sun  ?  mexiletine  150 mg Oral BID  ? midodrine  5 mg Oral TID WC  ? pantoprazole  40 mg Oral Daily  ? rosuvastatin  20 mg Oral q morning  ? sodium chloride flush  3 mL Intravenous Q12H  ? sotalol  80 mg Oral Daily  ? traZODone  50 mg Oral QHS  ? Warfarin - Pharmacist Dosing Inpatient   Does not apply [id number]  ? ? ?Infusions: ? sodium chloride    ? ? ?PRN Medications: ?sodium chloride, acetaminophen, ondansetron (ZOFRAN) IV, sodium chloride flush, traMADol ? ? ?Patient Profile  ? ?81 y.o. 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 Presenting with recurrent syncopal episodes. Found to be in VT.  Underwent emergent DC-CV in ED ? ?Assessment/Plan:   ? 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  ?- Off amio due to cerebellar side  effects.  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  ?- Seen in ER 06/16/21 with recurrent VT. DC-CV x 1 ?- PVCs improved on mexilitene. Continue mexilitene 150 bid ?- Sotalol stopped last week due to bradycardia  ?- s/p emergent DC-CV in ER on 12/28/21 ?- Restarted sotalol 80 mg daily. EP following EKGs during load    ?- No recurrent VT  ?- Keep K > 4.0 Mg > 2.0 ? ?  ?2. Chronic systolic HF:  ?- ICM, EF 19-14% s/p LVAD HM II implant 08/2013 for DT.   ?- Echo 3/18 EF 10-15% mild AI. Mod RV dysfunction. LVAD cannula OK ?- has struggled with RV failure however RHC 11/22 looked good (despite evidence of RVF on LE dopplers showing reflux ?- NYHA III. Given IVFs in ED, volume felt low  ?- volume status ok on exam today  ?- Remains off all diuretics including spiro ?- Failed Jardiance due to volume depletion ?- He has not tolerated ACE-I or amlodipine or b-blocker in the past. ?  ?3. LVAD: HMII 08/2013 for DT. ?- VAD interrogated personally. Parameters stable. ?- Admitted in May 2018 with elevated LDH with bival.  ?- Continue aspirin and coumadin.  ?- Hgb 8.4, stable  ?- LDH 254>>324>>301  ?- DL site looks good ?- On warfarin INR 3.3 today Goal 2.0-3.0. Dosing per PharmD ?  ?4. 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  ?5. Painful left 2nd toe ?- uric acid normal, 6.7  ?  ?8. Atrial flutter, recurrent ?- patient paced out of AFL in 6/22. Atrial therapies enabled  ?- continue sotalol (on for VT). QTs slightly prolonged but stable ?- AC as above ?- may need to consider ablation if recurs ?  ?9. Chronic anticoagulation: INR goal 2.0-3.0.    ?- INR 3.3 today See above ?- Continue ASA 81 mg for LVAD + warfarin.  ?  ?10. HTN:  ?- MAPs high low 100s. C/w some orthostasis  ?- reduced midodrine to 5 mg tid  ?- monitor response to reduced midodrine dose. If c/w orthostatic hypotension will add Florinef 0.1 ?  ?11. Hypothyroidism:  ?- Followed by Dr Dwyane Dee. Stable. No change  ?  ?12. Hyperlipidemia:  ?- Continue Crestor. Zetia stopped  due to cost.  ?- No change  ?  ?13. CAD ?- no s/s ischemia ?- continue Crestor ?  ?14. CKD IV ?- Creatinine baseline 1.7-2.0 ?- creatinine increasing recently has been low 2s ?- SCr this admit, 2.43>>2.24 >>2.34 ?- failed Jardiance ?- Has outpatient Nephrology referral ?  ?  36. Debility ?- has been struggling since TKA ?- unable to do ADLs now ?- PT/OT recommending HH PT  ?  ? ?I reviewed the LVAD parameters from today, and compared the results to the patient's prior recorded data.  No programming changes were made.  The LVAD is functioning within specified parameters.  The patient performs LVAD self-test daily.  LVAD interrogation was negative for any significant power changes, alarms or PI events/speed drops.  LVAD equipment check completed and is in good working order.  Back-up equipment present.   LVAD education done on emergency procedures and precautions and reviewed exit site care. ? ?Length of Stay: 2 ? ?Lyda Jester, PA-C ?12/30/2021, 9:42 AM ? ?VAD Team --- VAD ISSUES ONLY--- ?Pager [phone] (7am - 7am) ? ?Advanced Heart Failure Team  ?Pager [phone] (M-F; 7a - 5p)  ?Please contact [facility] for night-coverage after hours (5p -7a ) and weekends on [email or web address] ? ?Patient seen and examined with the above-signed Advanced Practice Provider and/or Housestaff. I personally reviewed laboratory data, imaging studies and relevant notes. I independently examined the patient and formulated the important aspects of the plan. I have edited the note to reflect any of my changes or salient points. I have personally discussed the plan with the patient and/or family. ? ?Sotalol restarted. No further VT. BP remains high but will drop some with standing. Not symptomatic though. Scr stable. INR 3.3. ? ?General:  NAD.  ?HEENT: normal  ?Neck: supple. JVP not elevated.  Carotids 2+ bilat; no bruits. No lymphadenopathy or thryomegaly appreciated. ?Cor: LVAD hum.  ?Lungs: Clear. ?Abdomen:  soft, nontender, non-distended. No  hepatosplenomegaly. No bruits or masses. Good bowel sounds. Driveline site clean. Anchor in place.  ?Extremities: no cyanosis, clubbing, rash. Warm no edema  ?Neuro: alert & oriented x 3. No focal deficits. Mov

## 2021-12-30 NOTE — Progress Notes (Signed)
?  NAEO. Pt feeling OK this am.  ? ?Sotalol dose and EKG yesterday were both oddly timed.  ? ?Sotalol dosed at 1300 yesterday, with no EKG until the evening. so will get at 1200 today to march back towards his usual morning regimen.     ? ?EKG 2 hours later.  Discussed with RN.  ? ?Lollie Marrow, PA-C  ?12/30/2021 9:09 AM  ?

## 2021-12-31 ENCOUNTER — Encounter (HOSPITAL_COMMUNITY): Payer: Medicare Other

## 2021-12-31 LAB — BASIC METABOLIC PANEL
Anion gap: 9 (ref 5–15)
BUN: 37 mg/dL — ABNORMAL HIGH (ref 8–23)
CO2: 25 mmol/L (ref 22–32)
Calcium: 9 mg/dL (ref 8.9–10.3)
Chloride: 101 mmol/L (ref 98–111)
Creatinine, Ser: 2.37 mg/dL — ABNORMAL HIGH (ref 0.61–1.24)
GFR, Estimated: 27 mL/min — ABNORMAL LOW (ref >60)
Glucose, Bld: 99 mg/dL (ref 70–99)
Potassium: 4.2 mmol/L (ref 3.5–5.1)
Sodium: 135 mmol/L (ref 135–145)

## 2021-12-31 LAB — MAGNESIUM: Magnesium: 2 mg/dL (ref 1.7–2.4)

## 2021-12-31 LAB — LACTATE DEHYDROGENASE: LDH: 297 U/L — ABNORMAL HIGH (ref 98–192)

## 2021-12-31 LAB — CBC
HCT: 26.1 % — ABNORMAL LOW (ref 39.0–52.0)
Hemoglobin: 8.4 g/dL — ABNORMAL LOW (ref 13.0–17.0)
MCH: 29.6 pg (ref 26.0–34.0)
MCHC: 32.2 g/dL (ref 30.0–36.0)
MCV: 91.9 fL (ref 80.0–100.0)
Platelets: 218 10*3/uL (ref 150–400)
RBC: 2.84 MIL/uL — ABNORMAL LOW (ref 4.22–5.81)
RDW: 14.8 % (ref 11.5–15.5)
WBC: 7.2 10*3/uL (ref 4.0–10.5)
nRBC: 0 % (ref 0.0–0.2)

## 2021-12-31 LAB — PROTIME-INR
INR: 2.9 — ABNORMAL HIGH (ref 0.8–1.2)
Prothrombin Time: 29.9 seconds — ABNORMAL HIGH (ref 11.4–15.2)

## 2021-12-31 MED ORDER — SOTALOL HCL 80 MG PO TABS
80.0000 mg | ORAL_TABLET | Freq: Every day | ORAL | Status: DC
  Administered 2021-12-31 – 2022-01-02 (×3): 80 mg via ORAL
  Filled 2021-12-31 (×3): qty 1

## 2021-12-31 MED ORDER — FLUDROCORTISONE ACETATE 0.1 MG PO TABS
0.1000 mg | ORAL_TABLET | Freq: Every day | ORAL | Status: DC
  Administered 2021-12-31 – 2022-01-02 (×3): 0.1 mg via ORAL
  Filled 2021-12-31 (×3): qty 1

## 2021-12-31 MED ORDER — WARFARIN SODIUM 3 MG PO TABS
3.0000 mg | ORAL_TABLET | Freq: Once | ORAL | Status: AC
  Administered 2021-12-31: 3 mg via ORAL
  Filled 2021-12-31: qty 1

## 2021-12-31 NOTE — Progress Notes (Signed)
Morning EKG reviewed   ? ?Shows NSR with stable QTc at ~470-490 ms when measured manually and corrected for his wide QRS.  ? ?He has completed reload and may continue Sotalol 80 mg daily without further intense monitoring.  ? ?EKG as needed. Keep K > 4.0 and Mg > 2.0  ? ?EP to see as needed while remains here. Will make office follow up.  ? ?Shirley Friar, PA-C  ?Pager: [phone]  ?12/31/2021 1:45 PM  ? ?

## 2021-12-31 NOTE — Progress Notes (Signed)
ANTICOAGULATION CONSULT NOTE ?Pharmacy Consult for warfarin  ?Indication: LVAD ? ?Allergies  ?Allergen Reactions  ? Ace Inhibitors   ?  Hypotension and elevated creatinine  ? Amiodarone Other (See Comments)  ?  Cerebellar side effects  ? Diovan [Valsartan] Other (See Comments)  ?  Hypotension at low dose, hyperkalemia  ? Lipitor [Atorvastatin Calcium] Other (See Comments)  ?  Muscle pain  ? Jardiance [Empagliflozin] Other (See Comments)  ?  Unsure of exact side effect  ? Norvasc [Amlodipine] Other (See Comments)  ?  Put patient to sleep  ? ? ?Patient Measurements: ?Height: 5\' 7"  (170.2 cm) ?Weight: 68 kg (149 lb 14.6 oz) ?IBW/kg (Calculated) : 66.1 ? Heparin dosing weight: 71.1 kg ? ?Vital Signs: ?Temp: 97.9 ?F (36.6 ?C) (05/03 3335) ?Temp Source: Oral (05/03 4562) ?BP: 96/77 (05/03 1000) ?Pulse Rate: 95 (05/03 0800) ? ?Labs: ?Recent Labs  ?  12/29/21 ?5638 12/30/21 ?9373 12/30/21 ?4287 12/31/21 ?0125  ?HGB 8.6* 8.4*  --  8.4*  ?HCT 27.0* 26.0*  --  26.1*  ?PLT 214 213  --  218  ?LABPROT 35.0*  --  33.6* 29.9*  ?INR 3.5*  --  3.3* 2.9*  ?CREATININE 2.24* 2.34*  --  2.37*  ? ? ? ?Estimated Creatinine Clearance: 24 mL/min (A) (by C-G formula based on SCr of 2.37 mg/dL (H)). ? ? 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 Pharmacy consulted to dose warfarin.  ? ?Admit with VT> s/p DCCV in ED.   ? ?INR now down within range at 2.9.  ?Hgb 8.4 trending down, no bleeding noted. LDH down slightly to 29, up from baseline 200s but now trending down.  ?PTA warfarin dose: 5 mg daily ? 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  ?Plan:  ?Warfarin 2.5mg  today ?Daily INR ?Started florinef for orthostatic  ? ?Erin Hearing PharmD., BCPS ?Clinical Pharmacist ?12/31/2021 10:36 AM ? ?

## 2021-12-31 NOTE — Progress Notes (Addendum)
? ?Electrophysiology Rounding Note ? ?Patient Name: Johnny Oneill ?Date of Encounter: 12/31/2021 ? ?Primary Cardiologist: None ?Electrophysiologist: Johnny Sabol Meredith Leeds, MD ? ? ?Subjective  ? ?The patient is doing well today.  At this time, the patient denies chest pain, shortness of breath, or any new concerns. ? ?Inpatient Medications  ?  ?Scheduled Meds: ? aspirin EC  81 mg Oral q AM  ? Chlorhexidine Gluconate Cloth  6 each Topical Daily  ? citalopram  10 mg Oral q morning  ? levothyroxine  175 mcg Oral Once per day on Mon Tue Wed Thu Fri Sat  ? [START ON 01/04/2022] levothyroxine  87.5 mcg Oral Once per day on Sun  ? mexiletine  150 mg Oral BID  ? midodrine  5 mg Oral TID WC  ? pantoprazole  40 mg Oral Daily  ? rosuvastatin  20 mg Oral q morning  ? sodium chloride flush  3 mL Intravenous Q12H  ? sotalol  80 mg Oral Daily  ? traZODone  50 mg Oral QHS  ? Warfarin - Pharmacist Dosing Inpatient   Does not apply [id number]  ? ?Continuous Infusions: ? sodium chloride    ? ?PRN Meds: ?sodium chloride, acetaminophen, ondansetron (ZOFRAN) IV, sodium chloride flush, traMADol  ? ?Vital Signs  ?  ?Vitals:  ? 12/30/21 2339 12/31/21 0000 12/31/21 0100 12/31/21 0400  ?BP:  (!) 115/99  (!) 117/96  ?Pulse:  81 81   ?Resp:  15 18   ?Temp: 98.2 ?F (36.8 ?C)     ?TempSrc: Oral     ?SpO2:  96% 97%   ?Weight:      ?Height:      ? ? ?Intake/Output Summary (Last 24 hours) at 12/31/2021 0714 ?Last data filed at 12/31/2021 6045 ?Gross per 24 hour  ?Intake 830 ml  ?Output 1945 ml  ?Net -1115 ml  ? ?Filed Weights  ? 12/28/21 1216 12/30/21 0500  ?Weight: 71.5 kg 69.9 kg  ? ? ?Physical Exam  ?  ?GEN- The patient is well appearing, alert and oriented x 3 today.   ?Head- normocephalic, atraumatic ?Eyes-  Sclera clear, conjunctiva pink ?Ears- hearing intact ?Oropharynx- clear ?Neck- supple ?Lungs- Clear to ausculation bilaterally, normal work of breathing ?Heart- LVAD hum ?GI- soft, NT, ND, + BS ?Extremities- no clubbing or cyanosis. No edema ?Skin- no  rash or lesion ?Psych- euthymic mood, full affect ?Neuro- strength and sensation are intact ? ?Labs  ?  ?CBC ?Recent Labs  ?  12/28/21 ?1225 12/29/21 ?4098 12/30/21 ?1191 12/31/21 ?0125  ?WBC 7.2   < > 7.5 7.2  ?NEUTROABS 5.4  --   --   --   ?HGB 9.1*   < > 8.4* 8.4*  ?HCT 29.3*   < > 26.0* 26.1*  ?MCV 95.8   < > 90.6 91.9  ?PLT 226   < > 213 218  ? < > = values in this interval not displayed.  ? ?Basic Metabolic Panel ?Recent Labs  ?  12/30/21 ?0329 12/31/21 ?0125  ?NA 137 135  ?K 4.1 4.2  ?CL 105 101  ?CO2 23 25  ?GLUCOSE 102* 99  ?BUN 35* 37*  ?CREATININE 2.34* 2.37*  ?CALCIUM 8.8* 9.0  ?MG 2.2 2.0  ? ?Liver Function Tests ?Recent Labs  ?  12/28/21 ?1225  ?AST 22  ?ALT 12  ?ALKPHOS 98  ?BILITOT 0.9  ?PROT 7.0  ?ALBUMIN 3.2*  ? ?No results for input(s): LIPASE, AMYLASE in the last 72 hours. ?Cardiac Enzymes ?No results for input(s):  CKTOTAL, CKMB, CKMBINDEX, TROPONINI in the last 72 hours. ? ? ?Telemetry  ?  ?NSR 90s, no further VT. 1 brief NSVT (personally reviewed) ? ?Radiology  ?  ?No results found. ? ?Patient Profile  ?   ?Johnny Oneill is a 81 y.o. 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 . ? ?Assessment & Plan  ?  ?Recurrent VT ?Previously failed amio due to cerebellar side effects.  ?PVCs have overall improved on mexilitene. Continue mexilitene 150 bid ?No further back on sotalol. QTc OK thus far.  ?Continue sotalol 80 mg daily.  ?Keep K > 4.0 and Mg > 2.0.  ?No other good AAD options.  ?Pt is not candidate for advanced EP procedures.  ?  ?2. Chronic biventricular HF ?3. LVAD: HMII 08/2013 for DT. ?Echo 11/2021 LVEF 20-25%, Mod RV dysfunction.  ?NYHA III chronically.  ?Per CHF team.  ?Intolerant to BB in the past.  ?  ?4. Paroxysmal Atrial flutter, recurrent ?Atrial therapies enabled and often  successful.  ?Needed as recently as last week.  ?On coumadin for VAD.  ?  ?5. Chronic anticoagulation: INR goal 2.0-3.0.    ?Coumadin for VAD.  ?  ?6. CKD IV ?Creatinine baseline has been 2.2 - 2.4 or greater most recently.  ?  ?7. Debility ?- has been struggling since TKA ?- unable to do most ADLs now ?- May need CIR for CHF team, but OT/PT not recommending.  ? ?Dose this am Johnny Oneill conclude "re-load" and disposition per VAD team.  ? ?Dr. Curt Oneill has seen.  ? ? ?For questions or updates, please contact Johnny Oneill ?Please consult [email or web address] for contact info under Cardiology/STEMI. ? ?Signed, ?Johnny Friar, PA-C  ?12/31/2021, 7:14 AM  ? ?I have seen and examined this patient with Johnny Oneill.  Agree with above, note added to reflect my findings.  Patient feeling well.  Feeling better than he did a few days ago.  Remains on sotalol. ? ?GEN: Well nourished, well developed, in no acute distress  ?HEENT: normal  ?Neck: no JVD, carotid bruits, or masses ?Cardiac: LVAD hum, RRR; no murmurs, rubs, or gallops,no edema  ?Respiratory:  clear to auscultation bilaterally, normal work of breathing ?GI: soft, nontender, nondistended, + BS ?MS: no deformity or atrophy  ?Skin: warm and dry, device site well healed ?Neuro:  Strength and sensation are intact ?Psych: euthymic mood, full affect  ? ?Recurrent VT: Has failed amiodarone due to side effects.  Currently on mexiletine.  Being loaded on sotalol.  QTc is remained stable. ? ?Johnny Oneill M. Johnny Feeser MD ?12/31/2021 ?11:06 AM  ?

## 2021-12-31 NOTE — Progress Notes (Signed)
Occupational Therapy Treatment ?Patient Details ?Name: Johnny Oneill ?MRN: [id number] ?DOB: 18-May-1941 ?Today's Date: 12/31/2021 ? ? ?History of present illness Pt is a 81 y.o. male who presented 4/30 with weakness. Found in VT 200-210 bpm, underwent emergent DV-CV in ED.  Of note, pt admitted 12/01/21 - 12/12/21 for Rt TKA, complicated by orthostatic hypotension and again 4/20-21 with hypotension. PMhx: CKD, CHF, HTN, LVAD HM II 2015, HLD, CAD, Lt TKA, ICD ?  ?OT comments ? Pt making limited progress towards OT goals this session. Pt eager to work with therapy, but limited by orthostatic blood pressure (with positive symptoms). Pt was able to perform transfers at min guard A (A for holding down RW) and grooming seated at sink (initially standing but got light headed and dizzy). OT will continue to follow acutely. ? ?supine 126/108 (115) ?sitting 106/87 (94) ?sitting after 3 min 116/99 (106) ?standing 96/77 (84) ?--/-- unable to read initially in seated position ?seated after 3 min 115/100 (106) ?seated after additional 3 min 115/97 (106) ? ?Pt performing own battery switch over without problems and left on battery power in the recliner.  ? ?Recommendations for follow up therapy are one component of a multi-disciplinary discharge planning process, led by the attending physician.  Recommendations may be updated based on patient status, additional functional criteria and insurance authorization. ?   ?Follow Up Recommendations ? No OT follow up  ?  ?Assistance Recommended at Discharge Intermittent Supervision/Assistance  ?Patient can return home with the following ? A little help with walking and/or transfers;A little help with bathing/dressing/bathroom;Assistance with cooking/housework;Assist for transportation ?  ?Equipment Recommendations ? None recommended by OT  ?  ?Recommendations for Other Services   ? ?  ?Precautions / Restrictions Precautions ?Precautions: Knee;Fall;Other (comment) ?Precaution Booklet Issued:  No ?Precaution Comments: LVAD, watch HR and BP, R TKA 12/03/21 ?Restrictions ?Weight Bearing Restrictions: Yes ?RLE Weight Bearing: Weight bearing as tolerated  ? ? ?  ? ?Mobility Bed Mobility ?Overal bed mobility: Modified Independent ?  ?  ?  ?  ?  ?  ?  ?  ? ?Transfers ?Overall transfer level: Needs assistance ?Equipment used: Rolling walker (2 wheels) ?Transfers: Sit to/from Stand ?Sit to Stand: Min assist ?  ?  ?  ?  ?  ?General transfer comment: Pt requires min A to hold down RW as Pt pulls to standing ?  ?  ?Balance Overall balance assessment: Needs assistance ?Sitting-balance support: Feet supported, No upper extremity supported ?Sitting balance-Leahy Scale: Good ?  ?  ?Standing balance support: During functional activity ?Standing balance-Leahy Scale: Fair ?Standing balance comment: able to demonstrate SUE support for static standing balance, BUE for dynamic ?  ?  ?  ?  ?  ?  ?  ?  ?  ?  ?  ?   ? ?ADL either performed or assessed with clinical judgement  ? ?ADL Overall ADL's : Needs assistance/impaired ?  ?  ?Grooming: Oral care;Wash/dry face;Sitting ?Grooming Details (indicate cue type and reason): unable to tolerate standing due to orthostatics ?  ?  ?  ?  ?Upper Body Dressing : Set up;Sitting ?Upper Body Dressing Details (indicate cue type and reason): extra gown ?  ?  ?Toilet Transfer: Min guard;Ambulation;Rolling walker (2 wheels);Minimal assistance ?Toilet Transfer Details (indicate cue type and reason): hold RW steady for standing (Pt pulls up at baseline) ?  ?  ?  ?  ?Functional mobility during ADLs: Min guard;Rolling walker (2 wheels) ?General ADL Comments: orthostatic BP limiting activity  tolerance ?  ? ?Extremity/Trunk Assessment Upper Extremity Assessment ?Upper Extremity Assessment: Overall WFL for tasks assessed ?  ?  ?  ?  ?  ? ?Vision   ?  ?  ?Perception   ?  ?Praxis   ?  ? ?Cognition Arousal/Alertness: Awake/alert ?Behavior During Therapy: [facility] for tasks assessed/performed ?Overall Cognitive  Status: Within Functional Limits for tasks assessed ?  ?  ?  ?  ?  ?  ?  ?  ?  ?  ?  ?  ?  ?  ?  ?  ?  ?  ?  ?   ?Exercises   ? ?  ?Shoulder Instructions   ? ? ?  ?General Comments Highest HR witnessed 136; supine 126/108, sitting 106/87; sitting after 3 min 116/99; standing 96/77, --/-- unable to read initially in seated position; seated after 3 min 115/100; seated after additional 3 min 115/97  ? ? ?Pertinent Vitals/ Pain       Pain Assessment ?Pain Assessment: Faces ?Faces Pain Scale: Hurts little more ?Pain Location: Rt knee with AROM ?Pain Descriptors / Indicators: Grimacing, Discomfort ?Pain Intervention(s): Limited activity within patient's tolerance, Monitored during session, Repositioned ? ?Home Living   ?  ?  ?  ?  ?  ?  ?  ?  ?  ?  ?  ?  ?  ?  ?  ?  ?  ?  ? ?  ?Prior Functioning/Environment    ?  ?  ?  ?   ? ?Frequency ? Min 2X/week  ? ? ? ? ?  ?Progress Toward Goals ? ?OT Goals(current goals can now be found in the care plan section) ? Progress towards OT goals: Progressing toward goals (progressing but limited by BP) ? ?Acute Rehab OT Goals ?Patient Stated Goal: get BP under control ?OT Goal Formulation: With patient ?Time For Goal Achievement: 01/12/22 ?Potential to Achieve Goals: Good  ?Plan Discharge plan remains appropriate   ? ?Co-evaluation ? ? ?   ?  ?  ?  ?  ? ?  ?AM-PAC OT "6 Clicks" Daily Activity     ?Outcome Measure ? ? Help from another person eating meals?: None ?Help from another person taking care of personal grooming?: A Little ?Help from another person toileting, which includes using toliet, bedpan, or urinal?: A Little ?Help from another person bathing (including washing, rinsing, drying)?: A Little ?Help from another person to put on and taking off regular upper body clothing?: A Little ?Help from another person to put on and taking off regular lower body clothing?: A Little ?6 Click Score: 19 ? ?  ?End of Session Equipment Utilized During Treatment: Rolling walker (2 wheels) ? ?OT  Visit Diagnosis: Unsteadiness on feet (R26.81);Muscle weakness (generalized) (M62.81) ?Pain - Right/Left: Right ?Pain - part of body: Knee ?  ?Activity Tolerance Treatment limited secondary to medical complications (Comment) (orthostatics) ?  ?Patient Left in chair;with call bell/phone within reach ?  ?Nurse Communication Mobility status;Other (comment) (BP) ?  ? ?   ? ?Time: 9458-5929 ?OT Time Calculation (min): 39 min ? ?Charges: OT General Charges ?$OT Visit: 1 Visit ?OT Treatments ?$Self Care/Home Management : 23-37 mins ?$Therapeutic Activity: 8-22 mins ? ?Jesse Sans OTR/L ?Acute Rehabilitation Services ?Pager: [phone] ?Office: [phone] ? ?Merri Ray Emmamae Mcnamara ?12/31/2021, 10:54 AM ?

## 2021-12-31 NOTE — Progress Notes (Addendum)
?Advanced Heart Failure VAD Team Note ? ?PCP-Cardiologist: Dr. Haroldine Laws  ? ?Subjective:   ? ?4/30: admitted w/ VT. Underwent emergent DCCV.   ?5/1: Sotalol resumed. EP following  ?5/2: Midodrine reduced to 5 tid given elevated MAPs ? ?Maintaining NSR. No further VT. EP following EKGs w/ sotalol loading.  ? ?K 4.2 ?Mg 2.0 ? ?MAPs remain elevated in the low 100s, despite midodrine decrease.  ? ?Feels fine currently but hasn't been out of bed yet. PT at bedside, getting ready to ambulate.  ? ?No complaints. Denies dyspnea.  ? ?LVAD INTERROGATION:  ?HeartMate II LVAD:   ?Flow 3.9 liters/min, speed 9600, power 7.1, PI 5.3. 3 PI events  ? ?Objective:   ? ?Vital Signs:   ?Temp:  [97.9 ?F (36.6 ?C)-98.9 ?F (37.2 ?C)] 97.9 ?F (36.6 ?C) (05/03 4403) ?Pulse Rate:  [80-81] 81 (05/03 0100) ?Resp:  [13-21] 18 (05/03 0100) ?BP: (115-136)/(96-118) 117/96 (05/03 0400) ?SpO2:  [96 %-100 %] 97 % (05/03 0100) ?Weight:  [68 kg] 68 kg (05/03 0807) ?Last BM Date : 12/29/21 ?Mean arterial Pressure low 100s  ? ?Intake/Output:  ? ?Intake/Output Summary (Last 24 hours) at 12/31/2021 0940 ?Last data filed at 12/31/2021 0809 ?Gross per 24 hour  ?Intake 720 ml  ?Output 1945 ml  ?Net -1225 ml  ?  ? ?Physical Exam  ?  ?General:  Well appearingNo resp difficulty ?HEENT: normal ?Neck: supple. JVP not elevated . Carotids 2+ bilat; no bruits. No lymphadenopathy or thyromegaly appreciated. ?Cor: Mechanical heart sounds with LVAD hum present. + zoll pads on chest ?Lungs: CTAB  ?Abdomen: soft, nontender, nondistended. No hepatosplenomegaly. No bruits or masses. Good bowel sounds. ?Driveline: C/D/I; securement device intact and driveline incorporated ?Extremities: no cyanosis, clubbing, rash, edema. Legs warm  ?Neuro: alert & orientedx3, cranial nerves grossly intact. moves all 4 extremities w/o difficulty. Affect pleasant ? ? ?Telemetry  ? ?NSR 90s. No further VT  ? ?EKG  ?  ?No new EKG to review  ? ?Labs  ? ?Basic Metabolic Panel: ?Recent Labs  ?Lab  12/28/21 ?1225 12/29/21 ?4742 12/30/21 ?5956 12/31/21 ?0125  ?NA 136 134* 137 135  ?K 4.1 4.7 4.1 4.2  ?CL 103 103 105 101  ?CO2 24 22 23 25   ?GLUCOSE 122* 148* 102* 99  ?BUN 25* 27* 35* 37*  ?CREATININE 2.43* 2.24* 2.34* 2.37*  ?CALCIUM 8.8* 8.8* 8.8* 9.0  ?MG 1.8 2.4 2.2 2.0  ? ? ?Liver Function Tests: ?Recent Labs  ?Lab 12/28/21 ?1225  ?AST 22  ?ALT 12  ?ALKPHOS 98  ?BILITOT 0.9  ?PROT 7.0  ?ALBUMIN 3.2*  ? ?No results for input(s): LIPASE, AMYLASE in the last 168 hours. ?No results for input(s): AMMONIA in the last 168 hours. ? ?CBC: ?Recent Labs  ?Lab 12/28/21 ?1225 12/29/21 ?3875 12/30/21 ?6433 12/31/21 ?0125  ?WBC 7.2 6.8 7.5 7.2  ?NEUTROABS 5.4  --   --   --   ?HGB 9.1* 8.6* 8.4* 8.4*  ?HCT 29.3* 27.0* 26.0* 26.1*  ?MCV 95.8 93.4 90.6 91.9  ?PLT 226 214 213 218  ? ? ?INR: ?Recent Labs  ?Lab 12/28/21 ?1225 12/29/21 ?2951 12/30/21 ?8841 12/31/21 ?0125  ?INR 3.5* 3.5* 3.3* 2.9*  ? ? ? ? ?Imaging  ? ?No results found. ? ? ?Medications:   ? ? ?Scheduled Medications: ? aspirin EC  81 mg Oral q AM  ? Chlorhexidine Gluconate Cloth  6 each Topical Daily  ? citalopram  10 mg Oral q morning  ? levothyroxine  175 mcg Oral Once per  day on Mon Tue Wed Thu Fri Sat  ? [START ON 01/04/2022] levothyroxine  87.5 mcg Oral Once per day on Sun  ? mexiletine  150 mg Oral BID  ? midodrine  5 mg Oral TID WC  ? pantoprazole  40 mg Oral Daily  ? rosuvastatin  20 mg Oral q morning  ? sodium chloride flush  3 mL Intravenous Q12H  ? sotalol  80 mg Oral Daily  ? traZODone  50 mg Oral QHS  ? Warfarin - Pharmacist Dosing Inpatient   Does not apply [id number]  ? ? ?Infusions: ? sodium chloride    ? ? ?PRN Medications: ?sodium chloride, acetaminophen, ondansetron (ZOFRAN) IV, sodium chloride flush, traMADol ? ? ?Patient Profile  ? ?81 y.o. 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 Presenting with recurrent syncopal episodes. Found to be in VT.  Underwent  emergent DC-CV in ED ? ?Assessment/Plan:   ? 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  ?- Off amio due to cerebellar side effects.  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  ?- Seen in ER 06/16/21 with recurrent VT. DC-CV x 1 ?- PVCs improved on mexilitene. Continue mexilitene 150 bid ?- Sotalol stopped last week due to bradycardia  ?- s/p emergent DC-CV in ER on 12/28/21 ?- Restarted sotalol 80 mg daily. EP following EKGs during load    ?- No recurrent VT  ?- Keep K > 4.0 Mg > 2.0 ? ?  ?2. Chronic systolic HF:  ?- ICM, EF 49-67% s/p LVAD HM II implant 08/2013 for DT.   ?- Echo 3/18 EF 10-15% mild AI. Mod RV dysfunction. LVAD cannula OK ?- has struggled with RV failure however RHC 11/22 looked good (despite evidence of RVF on LE dopplers showing reflux ?- NYHA III. Given IVFs in ED, volume felt low  ?- volume status ok on exam today  ?- Remains off all diuretics including spiro ?- Failed Jardiance due to volume depletion ?- He has not tolerated ACE-I or amlodipine or b-blocker in the past. ?  ?3. LVAD: HMII 08/2013 for DT. ?- VAD interrogated personally. Parameters stable. ?- Admitted in May 2018 with elevated LDH with bival.  ?- Continue aspirin and coumadin.  ?- Hgb 8.4, stable  ?- LDH 254>>324>>301>>297 ?- DL site looks good ?- On warfarin INR 2.9 today Goal 2.0-3.0. Dosing per PharmD ?  ?4. 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  ?5. Painful left 2nd toe ?- uric acid normal, 6.7  ?  ?8. Atrial flutter, recurrent ?- patient paced out of AFL in 6/22. Atrial therapies enabled  ?- continue sotalol (on for VT). QTs slightly prolonged but stable ?- AC as above ?- may need to consider ablation if recurs ?  ?9. Chronic anticoagulation: INR goal 2.0-3.0.    ?- INR 2.9 today See above ?- Continue ASA 81 mg for LVAD + warfarin.  ?  ?10. HTN:  ?- MAPs high low 100s.  ?- reduced midodrine to 5 mg tid  ?- monitor response to reduced midodrine dose. If c/w orthostatic hypotension will add Florinef 0.1 ?  ?11. Hypothyroidism:   ?- Followed by Dr Dwyane Dee. Stable. No change  ?  ?12. Hyperlipidemia:  ?- Continue Crestor. Zetia stopped due to cost.  ?- No change  ?  ?13. CAD ?- no s/s ischemia ?- continue Crestor ?  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  ?15. Debility ?- has been struggling since TKA ?- unable to do ADLs now ?- PT/OT recommending HH PT  ?  ? ?I reviewed the LVAD parameters from today, and compared the results to the patient's prior recorded data.  No programming changes were made.  The LVAD is functioning within specified parameters.  The patient performs LVAD self-test daily.  LVAD interrogation was negative for any significant power changes, alarms or PI events/speed drops.  LVAD equipment check completed and is in good working order.  Back-up equipment present.   LVAD education done on emergency procedures and precautions and reviewed exit site care. ? ?Length of Stay: 3 ? ?Lyda Jester, PA-C ?12/31/2021, 9:40 AM ? ?VAD Team --- VAD ISSUES ONLY--- ?Pager [phone] (7am - 7am) ? ?Advanced Heart Failure Team  ?Pager [phone] (M-F; 7a - 5p)  ?Please contact [facility] for night-coverage after hours (5p -7a ) and weekends on [email or web address] ? ?Patient seen and examined with the above-signed Advanced Practice Provider and/or Housestaff. I personally reviewed laboratory data, imaging studies and relevant notes. I independently examined the patient and formulated the important aspects of the plan. I have edited the note to reflect any of my changes or salient points. I have personally discussed the plan with the patient and/or family. ? ?Loading back sotalol. QT stable. Had a brier NSVT overnight. Midodrine cut back to 5 tid. BP still elevated. No significant orthostasis. Volume status ok. Feels weak. No CP, SOB ,orthopnea or PND.  ? ?VAD interrogated personally. Parameters  stable. ? ?General:  NAD.  ?HEENT: normal  ?Neck: supple. JVP not elevated.  Carotids 2+ bilat; no bruits. No lymphadenopathy or thryomegaly appreciated. ?Cor: LVAD hum.  ?Lungs: Clear. ?Abdomen: soft, nontender

## 2021-12-31 NOTE — Progress Notes (Signed)
Transferred -in from [place] by chair awake and alert. ?

## 2021-12-31 NOTE — Progress Notes (Signed)
LVAD Coordinator Rounding Note: ? ?Admitted 12/28/21 from ED to Dr. Haroldine Laws service for VT. ? 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 Darcey [name] under bridge to transplant criteria. Patient was removed from transplant list 05/2016 per his choice. He is currently Destination Therapy LVAD.  ? ?Pts wife paged VAD pager over the weekend w/ c/o weakness and low flows. Pt presented to ED in VT. Cardioversion performed in ED by Dr Haroldine Laws ? ?Pt sitting up in the bed. Pt states that he is feeling much better. Pt was denied for inpatient rehab.  ? ?Maintaining NSR. No further VT. EP following EKGs w/ sotalol loading. Currently taking Sotalol 80 mg daily.  ? ?BP elevated this morning. Midodrine dosing at 5 mg TID.  ? ?Vital signs       ?Temp: 97.9 ?HR: 80   ?Doppler Pressure: 118     ?Automatic BP: 117/96 (103)     ?O2 Sat: 97% on RA ?Wt:  157.6>154.1>149.9 lbs ? ?LVAD interrogation reveals:  ?Speed: 9600    ?Flow: 3.8    ?Power: 5.3   ?PI: 7.2     ? ?Alarms: none ?Events: 3 today; 4 yesterday ? ?Fixed speed:  9600 ?Low speed limit: 9000 ? ?Drive Line:  Drive line is being maintained weekly by [name], his wife. Existing VAD dressing C/D/I Sorbaview dressing and Silverlon patch intact. Exit site healed and incorporated, the velour is fully implanted at exit site. Pt denies fever or chills. Weekly dressing changes per [facility] nurse or caregiver. Next dressing change due 01/01/22. ? ?Labs:  ?LDH trend: 324>301>297 ? ?INR trend: 3.5>3.3>2.9 ? ?Anticoagulation Plan: ?-INR Goal:  2.5 - 3.0 ?-ASA Dose: 81 mg daily ? ? ?Device: Medtronic dual ?- Therapies: on 231 bpm ?- Pacing:  AAI<=>DDD 80 bpm ?- Last check: in ED per [employer] rep on 12/18/21 - rate response turned on ? ? ?Plan/Recommendations:  ?Call VAD Coordinator with any VAD equipment or drive line issues. ?Weekly dressing changes per [facility] nurse or caregiver.  ?3.   Hosp f/u made for next Thursday at 1000am ? ? ?Tanda Rockers RN ?VAD Coordinator  ?Office: [phone]  ?24/7 Pager:  [phone]  ? ? ?

## 2021-12-31 NOTE — Progress Notes (Signed)
Physical Therapy Treatment ?Patient Details ?Name: Johnny Oneill ?MRN: [id number] ?DOB: 11/03/1940 ?Today's Date: 12/31/2021 ? ? ?History of Present Illness Pt is a 81 y.o. male who presented 4/30 with weakness. Found in VT 200-210 bpm, underwent emergent DV-CV in ED.  Of note, pt admitted 12/01/21 - 12/12/21 for Rt TKA, complicated by orthostatic hypotension and again 4/20-21 with hypotension. PMhx: CKD, CHF, HTN, LVAD HM II 2015, HLD, CAD, Lt TKA, ICD ? ?  ?PT Comments  ? ? Pt progressing well towards his physical therapy goals. Received sitting up in chair. Pt ambulating 270 ft with a walker and chair follow; required 3 seated rest breaks. MAP > 80 throughout. Pt presents with deconditioning, decreased R knee ROM, weakness, and gait abnormalities. Will benefit from OPPT to address. ?   ?Recommendations for follow up therapy are one component of a multi-disciplinary discharge planning process, led by the attending physician.  Recommendations may be updated based on patient status, additional functional criteria and insurance authorization. ? ?Follow Up Recommendations ? Outpatient PT ?  ?  ?Assistance Recommended at Discharge Frequent or constant Supervision/Assistance  ?Patient can return home with the following A little help with walking and/or transfers;A little help with bathing/dressing/bathroom;Assistance with cooking/housework;Assist for transportation;Help with stairs or ramp for entrance ?  ?Equipment Recommendations ? None recommended by PT  ?  ?Recommendations for Other Services   ? ? ?  ?Precautions / Restrictions Precautions ?Precautions: Knee;Fall;Other (comment) ?Precaution Booklet Issued: No ?Precaution Comments: LVAD, watch HR and BP, R TKA 12/03/21 ?Restrictions ?Weight Bearing Restrictions: Yes ?RLE Weight Bearing: Weight bearing as tolerated  ?  ? ?Mobility ? Bed Mobility ?Overal bed mobility: Modified Independent ?  ?  ?  ?  ?  ?  ?  ?  ? ?Transfers ?Overall transfer level: Needs assistance ?Equipment  used: Rolling walker (2 wheels) ?Transfers: Sit to/from Stand ?Sit to Stand: Supervision ?  ?  ?  ?  ?  ?General transfer comment: Supervision for safety. Cues to not place R foot so far anteriorly with standing to sitting to promote increased knee flexion ?  ? ?Ambulation/Gait ?Ambulation/Gait assistance: Min guard ?Gait Distance (Feet): 270 Feet ?Assistive device: Rolling walker (2 wheels) ?Gait Pattern/deviations: Decreased stride length, Decreased stance time - right, Decreased weight shift to right, Antalgic, Step-through pattern, Trunk flexed, Decreased step length - left ?Gait velocity: decreased ?  ?  ?General Gait Details: Pt with slowed, antalgic gait pattern with decreased Rt stance time and thus Lft step length secondary to R TKA. Pt requiring 3 sitting rest breaks due to fatigue ? ? ?Stairs ?  ?  ?  ?  ?  ? ? ?Wheelchair Mobility ?  ? ?Modified Rankin (Stroke Patients Only) ?  ? ? ?  ?Balance Overall balance assessment: Needs assistance ?Sitting-balance support: Feet supported, No upper extremity supported ?Sitting balance-Leahy Scale: Good ?  ?  ?Standing balance support: During functional activity ?Standing balance-Leahy Scale: Fair ?Standing balance comment: able to demonstrate SUE support for static standing balance, BUE for dynamic ?  ?  ?  ?  ?  ?  ?  ?  ?  ?  ?  ?  ? ?  ?Cognition Arousal/Alertness: Awake/alert ?Behavior During Therapy: [facility] for tasks assessed/performed ?Overall Cognitive Status: Within Functional Limits for tasks assessed ?  ?  ?  ?  ?  ?  ?  ?  ?  ?  ?  ?  ?  ?  ?  ?  ?  ?  ?  ? ?  ?  Exercises Total Joint Exercises ?Towel Squeeze: Both, 10 reps, Seated ?Long Arc Quad: Both, 10 reps, Seated ?General Exercises - Lower Extremity ?Hip Flexion/Marching: Both, 10 reps, Seated ? ?  ?General Comments   ?  ?  ? ?Pertinent Vitals/Pain Pain Assessment ?Pain Assessment: Faces ?Faces Pain Scale: Hurts little more ?Pain Location: Rt knee with AROM ?Pain Descriptors / Indicators: Grimacing,  Discomfort ?Pain Intervention(s): Monitored during session  ? ? ?Home Living   ?  ?  ?  ?  ?  ?  ?  ?  ?  ?   ?  ?Prior Function    ?  ?  ?   ? ?PT Goals (current goals can now be found in the care plan section) Acute Rehab PT Goals ?Potential to Achieve Goals: Good ?Progress towards PT goals: Progressing toward goals ? ?  ?Frequency ? ? ? Min 5X/week ? ? ? ?  ?PT Plan Current plan remains appropriate  ? ? ?Co-evaluation   ?  ?  ?  ?  ? ?  ?AM-PAC PT "6 Clicks" Mobility   ?Outcome Measure ? Help needed turning from your back to your side while in a flat bed without using bedrails?: None ?Help needed moving from lying on your back to sitting on the side of a flat bed without using bedrails?: None ?Help needed moving to and from a bed to a chair (including a wheelchair)?: A Little ?Help needed standing up from a chair using your arms (e.g., wheelchair or bedside chair)?: A Little ?Help needed to walk in hospital room?: A Little ?Help needed climbing 3-5 steps with a railing? : A Little ?6 Click Score: 20 ? ?  ?End of Session Equipment Utilized During Treatment: Gait belt ?Activity Tolerance: Patient tolerated treatment well ?Patient left: in bed;with call bell/phone within reach ?Nurse Communication: Mobility status ?PT Visit Diagnosis: Other abnormalities of gait and mobility (R26.89);Muscle weakness (generalized) (M62.81) ?Pain - Right/Left: Right ?Pain - part of body: Knee ?  ? ? ?Time: 0511-0211 ?PT Time Calculation (min) (ACUTE ONLY): 31 min ? ?Charges:  $Therapeutic Activity: 23-37 mins          ?          ? ?Wyona Almas, PT, DPT ?Acute Rehabilitation Services ?Pager [phone] ?Office [phone] ? ? ? ?Deno Etienne ?12/31/2021, 4:53 PM ? ?

## 2022-01-01 LAB — CBC
HCT: 26.8 % — ABNORMAL LOW (ref 39.0–52.0)
Hemoglobin: 8.7 g/dL — ABNORMAL LOW (ref 13.0–17.0)
MCH: 29.6 pg (ref 26.0–34.0)
MCHC: 32.5 g/dL (ref 30.0–36.0)
MCV: 91.2 fL (ref 80.0–100.0)
Platelets: 203 10*3/uL (ref 150–400)
RBC: 2.94 MIL/uL — ABNORMAL LOW (ref 4.22–5.81)
RDW: 14.9 % (ref 11.5–15.5)
WBC: 5.9 10*3/uL (ref 4.0–10.5)
nRBC: 0 % (ref 0.0–0.2)

## 2022-01-01 LAB — BASIC METABOLIC PANEL
Anion gap: 9 (ref 5–15)
BUN: 39 mg/dL — ABNORMAL HIGH (ref 8–23)
CO2: 26 mmol/L (ref 22–32)
Calcium: 9.1 mg/dL (ref 8.9–10.3)
Chloride: 102 mmol/L (ref 98–111)
Creatinine, Ser: 2.33 mg/dL — ABNORMAL HIGH (ref 0.61–1.24)
GFR, Estimated: 28 mL/min — ABNORMAL LOW (ref >60)
Glucose, Bld: 102 mg/dL — ABNORMAL HIGH (ref 70–99)
Potassium: 3.8 mmol/L (ref 3.5–5.1)
Sodium: 137 mmol/L (ref 135–145)

## 2022-01-01 LAB — MAGNESIUM: Magnesium: 2.1 mg/dL (ref 1.7–2.4)

## 2022-01-01 LAB — LACTATE DEHYDROGENASE: LDH: 326 U/L — ABNORMAL HIGH (ref 98–192)

## 2022-01-01 LAB — PROTIME-INR
INR: 2.6 — ABNORMAL HIGH (ref 0.8–1.2)
Prothrombin Time: 27.6 seconds — ABNORMAL HIGH (ref 11.4–15.2)

## 2022-01-01 MED ORDER — WARFARIN SODIUM 5 MG PO TABS
5.0000 mg | ORAL_TABLET | Freq: Once | ORAL | Status: AC
  Administered 2022-01-01: 5 mg via ORAL
  Filled 2022-01-01: qty 1

## 2022-01-01 MED ORDER — ADULT MULTIVITAMIN W/MINERALS CH
1.0000 | ORAL_TABLET | Freq: Every day | ORAL | Status: DC
  Administered 2022-01-02: 1 via ORAL
  Filled 2022-01-01: qty 1

## 2022-01-01 MED ORDER — POTASSIUM CHLORIDE CRYS ER 20 MEQ PO TBCR
20.0000 meq | EXTENDED_RELEASE_TABLET | Freq: Once | ORAL | Status: AC
  Administered 2022-01-01: 20 meq via ORAL
  Filled 2022-01-01: qty 1

## 2022-01-01 MED ORDER — ENSURE ENLIVE PO LIQD: 237.0000 mL | Freq: Three times a day (TID) | ORAL | Status: DC

## 2022-01-01 MED ORDER — ENSURE ENLIVE PO LIQD: 237.0000 mL | Freq: Two times a day (BID) | ORAL | Status: DC

## 2022-01-01 NOTE — Progress Notes (Signed)
Physical Therapy Treatment ?Patient Details ?Name: Johnny Oneill ?MRN: [id number] ?DOB: [birth year] ?Today's Date: 01/01/2022 ? ? ?History of Present Illness Pt is a 81 y.o. male who presented 4/30 with weakness. Found in VT 200-210 bpm, underwent emergent DV-CV in ED.  Of note, pt admitted 12/01/21 - 12/12/21 for Rt TKA, complicated by orthostatic hypotension and again 4/20-21 with hypotension. PMhx: CKD, CHF, HTN, LVAD HM II 2015, HLD, CAD, Lt TKA, ICD ? ?  ?PT Comments  ? ? Pt making good progress with functional activity tolerance. Able to amb 350' with only 1 brief standing rest break and no reports of light headedness. Continue to work on strength and motion of rt knee as well as mobility.    ?Recommendations for follow up therapy are one component of a multi-disciplinary discharge planning process, led by the attending physician.  Recommendations may be updated based on patient status, additional functional criteria and insurance authorization. ? ?Follow Up Recommendations ? Outpatient PT ?  ?  ?Assistance Recommended at Discharge Frequent or constant Supervision/Assistance  ?Patient can return home with the following A little help with walking and/or transfers;A little help with bathing/dressing/bathroom;Assistance with cooking/housework;Assist for transportation;Help with stairs or ramp for entrance ?  ?Equipment Recommendations ? None recommended by PT  ?  ?Recommendations for Other Services   ? ? ?  ?Precautions / Restrictions Precautions ?Precautions: Knee;Fall;Other (comment) ?Precaution Booklet Issued: No ?Precaution Comments: LVAD, watch HR and BP, R TKA 12/03/21 ?Restrictions ?Weight Bearing Restrictions: Yes ?RLE Weight Bearing: Weight bearing as tolerated  ?  ? ?Mobility ? Bed Mobility ?Overal bed mobility: Modified Independent ?  ?  ?  ?  ?  ?  ?  ?  ? ?Transfers ?Overall transfer level: Needs assistance ?Equipment used: Rolling walker (2 wheels) ?Transfers: Sit to/from Stand ?Sit to Stand: Supervision ?   ?  ?  ?  ?  ?General transfer comment: Verbal cues for hand placement ?  ? ?Ambulation/Gait ?Ambulation/Gait assistance: Supervision ?Gait Distance (Feet): 350 Feet ?Assistive device: Rolling walker (2 wheels) ?Gait Pattern/deviations: Decreased stride length, Decreased stance time - right, Decreased weight shift to right, Antalgic, Step-through pattern, Trunk flexed, Decreased step length - left ?Gait velocity: decreased ?Gait velocity interpretation: 1.31 - 2.62 ft/sec, indicative of limited community ambulator ?  ?General Gait Details: Supervision for safety and with chair follow in case of symptoms of orthostasis ? ? ?Stairs ?  ?  ?  ?  ?  ? ? ?Wheelchair Mobility ?  ? ?Modified Rankin (Stroke Patients Only) ?  ? ? ?  ?Balance Overall balance assessment: Needs assistance ?Sitting-balance support: Feet supported, No upper extremity supported ?Sitting balance-Leahy Scale: Good ?  ?  ?Standing balance support: During functional activity, No upper extremity supported ?Standing balance-Leahy Scale: Fair ?  ?  ?  ?  ?  ?  ?  ?  ?  ?  ?  ?  ?  ? ?  ?Cognition Arousal/Alertness: Awake/alert ?Behavior During Therapy: [facility] for tasks assessed/performed ?Overall Cognitive Status: Within Functional Limits for tasks assessed ?  ?  ?  ?  ?  ?  ?  ?  ?  ?  ?  ?  ?  ?  ?  ?  ?  ?  ?  ? ?  ?Exercises Total Joint Exercises ?Long Arc Quad: Seated, Right, 5 reps ?Knee Flexion: AROM, Right, 5 reps, Seated ? ?  ?General Comments   ?  ?  ? ?Pertinent Vitals/Pain Pain Assessment ?Pain Assessment:  Faces ?Faces Pain Scale: Hurts little more ?Pain Location: Rt knee with AROM ?Pain Descriptors / Indicators: Grimacing, Discomfort ?Pain Intervention(s): Monitored during session, Limited activity within patient's tolerance  ? ? ?Home Living Family/patient expects to be discharged to:: Private residence ?Living Arrangements: Spouse/significant other ?  ?  ?  ?  ?  ?  ?  ?  ?   ?  ?Prior Function    ?  ?  ?   ? ?PT Goals (current goals can now  be found in the care plan section) Progress towards PT goals: Progressing toward goals ? ?  ?Frequency ? ? ? Min 3X/week ? ? ? ?  ?PT Plan Current plan remains appropriate;Frequency needs to be updated  ? ? ?Co-evaluation   ?  ?  ?  ?  ? ?  ?AM-PAC PT "6 Clicks" Mobility   ?Outcome Measure ? Help needed turning from your back to your side while in a flat bed without using bedrails?: None ?Help needed moving from lying on your back to sitting on the side of a flat bed without using bedrails?: None ?Help needed moving to and from a bed to a chair (including a wheelchair)?: A Little ?Help needed standing up from a chair using your arms (e.g., wheelchair or bedside chair)?: A Little ?Help needed to walk in hospital room?: A Little ?Help needed climbing 3-5 steps with a railing? : A Little ?6 Click Score: 20 ? ?  ?End of Session Equipment Utilized During Treatment: Gait belt ?Activity Tolerance: Patient tolerated treatment well ?Patient left: in bed;with call bell/phone within reach ?  ?PT Visit Diagnosis: Other abnormalities of gait and mobility (R26.89);Muscle weakness (generalized) (M62.81) ?Pain - Right/Left: Right ?Pain - part of body: Knee ?  ? ? ?Time: 5364-6803 ?PT Time Calculation (min) (ACUTE ONLY): 15 min ? ?Charges:  $Gait Training: 8-22 mins          ?          ? ?[facility] PT ?Acute Rehabilitation Services ?Office [phone] ? ? ? ?Shary Decamp [facility] ?01/01/2022, 2:25 PM ? ?

## 2022-01-01 NOTE — Discharge Summary (Addendum)
Advanced Heart Failure Team ? ?Discharge Summary  ? ?Patient ID: Johnny Oneill ?MRN: [id number], DOB/AGE: February 24, 1941 81 y.o. Admit date: 12/28/2021 ?D/C date:     01/02/2022  ? ?Primary Discharge Diagnoses:  ?Sustained VT ?Chronic systolic CHF ?HM II LVAD ?AFL ?Hypertension with orthostatic hypotension ?CKD IV ? ? ?Hospital Course:  ?  ?Johnny Oneill is a 81 y.o. 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  ?  ?Presented to ER on 06/27/19 with syncopal episode and head injury from fall. ECG showed VT @ 240. Underwent emergent DC-CV in ER. Head CT no bleed. Admitted and had ICD gen replacement (EOL). ?  ?Did not tolerate amio due to tremors. Had ICD shock for VT on 06/09/20. Started on sotalol. ?  ?On 02/06/21 Paced out of AFL. ?  ?Seen in ER 05/2021 due to LOW FLOW alarms. Found to be in VT rate 170. DCCV x 1 - returned to [facility] with frequent PVCs. Pacer settings adjusted by [name] PA- DDD 90. Post cardioversion persistent LOW FLOWS noted. Received 2 L IVF. Low flows resolved, and he was discharged home.  ?  ?[place] 11/22 ?RA = 5 ?RV = 33/5 ?PA = 33/13 (20) ?PCW = 10 ?Fick cardiac output/index = 4.0/2.1 ?Thermo CO/CI = 4.9/2.6 ?PVR = 2.0 WU ?Ao sat = 95% ?PA sat = 54%, 59% ?PAPI = 4.0 ?  ?Admitted 04/23 for R TKA. Did well with surgery but complicated by severe orthostatic hypotension.Started midodrine 10 tid. BP improved. Discharged 04/14 and readmitted 04/20 with weakness and syncope. ICD interrogation no arrhythmias. + orthostatic. Felt to be related to volume depletion. Hydrated. Midodrine continued. Sotalol stopped due to bradycardia ?  ?Presented with recurrent VT 12/28/21. Underwent emergent DCCV in ED and was admitted. EP consulted. Sotalol restarted and he was monitored for loading. Volume depleted on presentation and given IV fluids. Continued to have elevated MAPs as well as orthostasis. Midodrine decreased and florinef  added.  ? ?Seen by PT/OT. HH PT/OT recommended, arranged at discharge. ? ?Has f/u with EP and VAD clinic next week. ? ?Hospital Course by Problem: ? 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  ?- Off amio due to cerebellar side effects.  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  ?- Seen in ER 06/16/21 with recurrent VT. DC-CV x 1 ?- PVCs improved on mexilitene. Continues on mexilitene 150 bid ?- Sotalol stopped recently due to bradycardia  ?- Recurrent VT s/p emergent DC-CV in ER on 12/28/21 ?- Restarted sotalol 80 mg daily. EP followed EKGs during load. Qtc stable. Has outpatient f/u with EP. ?- No recurrent VT  ?- Keep K > 4.0 Mg > 2.0 ?  ?2. Chronic systolic HF:  ?- ICM, EF 81-82% s/p LVAD HM II implant 08/2013 for DT.   ?- Echo 3/18 EF 10-15% mild AI. Mod RV dysfunction. LVAD cannula OK ?- has struggled with RV failure however RHC 11/22 looked good (despite evidence of RVF on LE dopplers showing reflux ?- NYHA III. Given IVFs in ED, volume felt low  ?- volume status ok on exam today  ?- Remains off all diuretics including spiro ?- Failed Jardiance due to volume depletion ?- He has not tolerated ACE-I or amlodipine or b-blocker in the past. ?  ?3. LVAD: HMII 08/2013 for DT. ?- VAD interrogated personally. Parameters stable. ?- Admitted in May 2018 with elevated LDH with bival.  ?- Continue aspirin and coumadin.  ?-  Hgb 8.4, stable  ?- LDH 254>>324>>301>>297>>326 ?- DL site looks good ?- On warfarin INR 2.3 today. Goal 2.0-3.0. Usual home dose Warfarin resumed at discharge. ?  ?4. 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  ?5. Painful left 2nd toe ?- uric acid normal, 6.7  ?  ?8. Atrial flutter, recurrent ?- patient paced out of AFL in 6/22. Atrial therapies enabled  ?- continue sotalol (on for VT). QTc slightly prolonged but stable ?- AC as above ?- may need to consider ablation if recurs ?  ?9. Chronic anticoagulation: INR goal 2.0-3.0.    ?- INR 2.3 today See above ?- Continue ASA 81 mg for LVAD + warfarin.  ?   ?10. HTN with orthostatic hypotension:  ?- MAPs high, low 100s.  ?- reduced midodrine to 5 mg tid. Florinef 0.1 was added ?- Seems to be responding well to current regimen. No recurrent dizziness. ?  ?11. Hypothyroidism:  ?- Followed by Dr Dwyane Dee. Stable. No change  ?  ?12. Hyperlipidemia:  ?- Continue Crestor. Zetia stopped due to cost.  ?- No change  ?  ?13. CAD ?- no s/s ischemia ?- continue Crestor ?  ?14. CKD IV ?- Creatinine baseline 1.7-2.0 ?- creatinine increasing recently has been low 2s ?- SCr this admit, 2.43>>2.24 >>2.34>>2.37>>2.33>2.5 ?- failed Jardiance ?- Has outpatient Nephrology referral ?  ?15. Debility ?- has been struggling since TKA ?- difficulty with ADLs ?- PT/OT evaluated. HH PT recommend. Will arrange. ?  ? ? ?LVAD Interrogation HM II:   Speed: 9600    Flow: 4.6     PI: 5.7     Power: 5.7      Back-up speed: 9000      ? ?Discharge Weight Range: 157>>147 ?Discharge Vitals: Blood pressure (!) 111/100, pulse 79, temperature 97.8 ?F (36.6 ?C), temperature source Oral, resp. rate 18, height 5\' 7"  (1.702 m), weight 66.9 kg, SpO2 97 %. ? ?Labs: ?Lab Results  ?Component Value Date  ? WBC 6.2 01/02/2022  ? HGB 8.9 (L) 01/02/2022  ? HCT 27.6 (L) 01/02/2022  ? MCV 92.0 01/02/2022  ? PLT 210 01/02/2022  ?  ?Recent Labs  ?Lab 12/28/21 ?1225 12/29/21 ?6948 01/02/22 ?5462  ?NA 136   < > 135  ?K 4.1   < > 4.3  ?CL 103   < > 103  ?CO2 24   < > 25  ?BUN 25*   < > 45*  ?CREATININE 2.43*   < > 2.51*  ?CALCIUM 8.8*   < > 8.9  ?PROT 7.0  --   --   ?BILITOT 0.9  --   --   ?ALKPHOS 98  --   --   ?ALT 12  --   --   ?AST 22  --   --   ?GLUCOSE 122*   < > 110*  ? < > = values in this interval not displayed.  ? ?Lab Results  ?Component Value Date  ? CHOL 93 05/28/2015  ? HDL 41.50 05/28/2015  ? [place] 29 05/28/2015  ? TRIG 112.0 05/28/2015  ? ?BNP (last 3 results) ?No results for input(s): BNP in the last 8760 hours. ? ?ProBNP (last 3 results) ?No results for input(s): PROBNP in the last 8760 hours. ? ? ?Diagnostic  Studies/Procedures  ?Echo, 12/19/21: ?IMPRESSIONS  ? 1. LVAD cannula noted at LV apex.  ? 2. Left ventricular ejection fraction, by estimation, is 20 to 25%. The  ?left ventricle has severely decreased function.  The left ventricle  ?demonstrates global hypokinesis. The left ventricular internal cavity size  ?was mildly dilated. There is moderate  ?left ventricular hypertrophy. Left ventricular diastolic parameters are  ?indeterminate.  ? 3. Right ventricular systolic function is moderately reduced. The right  ?ventricular size is not well visualized. Tricuspid regurgitation signal is  ?inadequate for assessing PA pressure.  ? 4. The mitral valve is normal in structure. Mild to moderate mitral valve  ?regurgitation. No evidence of mitral stenosis.  ? 5. The aortic valve has an indeterminant number of cusps. Aortic valve  ?regurgitation is mild.  ? 6. The inferior vena cava is normal in size with greater than 50%  ?respiratory variability, suggesting right atrial pressure of 3 mmHg.  ? ?Discharge Medications  ? ?Allergies as of 01/02/2022   ? ?   Reactions  ? Ace Inhibitors   ? Hypotension and elevated creatinine  ? Amiodarone Other (See Comments)  ? Cerebellar side effects  ? Diovan [valsartan] Other (See Comments)  ? Hypotension at low dose, hyperkalemia  ? Lipitor [atorvastatin Calcium] Other (See Comments)  ? Muscle pain  ? Jardiance [empagliflozin] Other (See Comments)  ? Unsure of exact side effect  ? Norvasc [amlodipine] Other (See Comments)  ? Put patient to sleep  ? ?  ? ?  ?Medication List  ?  ? ?STOP taking these medications   ? ?potassium chloride SA 20 MEQ tablet ?Commonly known as: KLOR-CON M ?  ? ?  ? ?TAKE these medications   ? ?acetaminophen 500 MG tablet ?Commonly known as: TYLENOL ?Take 1,000 mg by mouth every 6 (six) hours as needed (pain). ?  ?aspirin EC 81 MG tablet ?Take 81 mg by mouth in the morning. ?  ?citalopram 10 MG tablet ?Commonly known as: CELEXA ?Take 1 tablet by mouth once daily ?What  changed: when to take this ?  ?CoQ10 200 MG Caps ?Take 200 mg by mouth in the morning. ?  ?fludrocortisone 0.1 MG tablet ?Commonly known as: FLORINEF ?Take 1 tablet (0.1 mg total) by mouth daily. ?[employer]

## 2022-01-01 NOTE — Progress Notes (Addendum)
LVAD Coordinator Rounding Note: ? ?Admitted 12/28/21 from ED to Dr. Haroldine Laws service for VT. ? 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 Darcey [name] under bridge to transplant criteria. Patient was removed from transplant list 05/2016 per his choice. He is currently Destination Therapy LVAD.  ? ?Pts wife paged VAD pager over the weekend w/ c/o weakness and low flows. Pt presented to ED in VT. Cardioversion performed in ED by Dr Haroldine Laws ? ?Pt sitting up in the bed. Pt states that he is feeling much better. Pt was denied for inpatient rehab. PT recommending outpatient PT.  ? ?Maintaining NSR. No further VT. EP following EKGs w/ sotalol loading. Currently taking Sotalol 80 mg daily.  ? ?BP stable this morning. Midodrine dosing at 5 mg TID and Florinef 0.1 mg daily. Denied orthostatic symptoms when standing/walking this morning. Will continue to monitor progress with PT.  ? ?Vital signs       ?Temp: 97.9 ?HR: 85   ?Doppler Pressure: 110     ?Automatic BP: 110/85 (95)     ?O2 Sat: 98% on RA ?Wt:  157.6>154.1>149.9 lbs ? ?LVAD interrogation reveals:  ?Speed: 9600    ?Flow: 3.5    ?Power: 5.1   ?PI: 7.0     ? ?Alarms: none ?Events: none ? ?Fixed speed:  9600 ?Low speed limit: 9000 ? ?Drive Line:  Drive line is being maintained weekly by [name], his wife. Existing VAD dressing removed and site care performed using sterile technique. Drive line exit site cleaned with SALINE wipes x 2, allowed to dry, and S[place]av[place] dressing with Silverlon patch applied. Exit site healed and incorporated, the velour is fully implanted at exit site. No redness, tenderness, drainage, foul odor or rash noted. Drive line anchor re-applied. Weekly dressing changes per [facility] nurse or caregiver. Next dressing change due 01/08/22. ? ?Labs:  ?LDH trend: 324>301>297>326 ? ?INR trend: 3.5>3.3>2.9>2.6 ? ?Anticoagulation Plan: ?-INR Goal:  2.5 - 3.0 ?-ASA Dose: 81 mg daily ? ? ?Device: [employer] dual ?- Therapies: on 231 bpm ?- Pacing:  AAI<=>DDD 80 bpm ?- Last  check: in ED per [employer] rep on 12/18/21 - rate response turned on ? ? ?Plan/Recommendations:  ?Call VAD Coordinator with any VAD equipment or drive line issues. ?Weekly dressing changes per [facility] nurse or caregiver.  ?3.   Hosp f/u made for next Thursday at 1000am ? ?Emerson Monte RN ?VAD Coordinator  ?Office: [phone]  ?24/7 Pager: [phone]  ? ? ? ?

## 2022-01-01 NOTE — Plan of Care (Signed)

## 2022-01-01 NOTE — Progress Notes (Addendum)
?Advanced Heart Failure VAD Team Note ? ?PCP-Cardiologist: Dr. Haroldine Laws  ? ?Subjective:   ? ?4/30: admitted w/ VT. Underwent emergent DCCV.   ?5/1: Sotalol resumed. EP following  ?5/2: Midodrine reduced to 5 tid given elevated MAPs ?5/3: Florinef added ? ?No recurrent VT. EP following EKGs w/ sotalol loading.  ? ?K 3.8, Mag 2.1 ? ?MAPs 90s-low 100s ? ?Feeling well. No complaints. Working with PT/OT, states he was not dizzy yesterday. Orthostatic during OT session. Florinef started. ? ? ?LVAD INTERROGATION:  ?HeartMate II LVAD:   ?Flow 3.1 liters/min, speed 9600, power 5, PI 5.1. 6 PI events ? ?Objective:   ? ?Vital Signs:   ?Temp:  [97.8 ?F (36.6 ?C)-98.2 ?F (36.8 ?C)] 97.9 ?F (36.6 ?C) (05/04 0800) ?Pulse Rate:  [80-85] 85 (05/04 0800) ?Resp:  [14-23] 17 (05/04 0800) ?BP: (90-125)/(74-106) 110/85 (05/04 0800) ?SpO2:  [97 %-100 %] 97 % (05/04 0241) ?Last BM Date : 12/30/21 ?Mean arterial Pressure 90s-low 100s ? ?Intake/Output:  ? ?Intake/Output Summary (Last 24 hours) at 01/01/2022 0918 ?Last data filed at 01/01/2022 0241 ?Gross per 24 hour  ?Intake 237 ml  ?Output 850 ml  ?Net -613 ml  ?  ? ?Physical Exam  ?  ?Physical Exam: ?GENERAL: No distress. Sitting up in bed. ?HEENT: normal  ?NECK: Supple, JVP flat.  2+ bilaterally, no bruits.   ?CARDIAC:  Mechanical heart sounds with LVAD hum present.  ?LUNGS:  Clear to auscultation bilaterally.  ?ABDOMEN:  Soft, round, nontender, positive bowel sounds x4.     ?LVAD exit site: well-healed and incorporated.  Dressing dry and intact.  No erythema or drainage.  Stabilization device present and accurately applied.  Driveline dressing is being changed daily per sterile technique. ?EXTREMITIES:  Warm and dry, no cyanosis, clubbing, rash or edema  ?NEUROLOGIC:  Alert and oriented x 4.  Gait steady.  No aphasia.  No dysarthria.  Affect pleasant.    ? ? ? ?Telemetry  ? ?AV paced 80s, no recurrent VT ? ?EKG  ?  ?No new EKG to review  ? ?Labs  ? ?Basic Metabolic Panel: ?Recent Labs   ?Lab 12/28/21 ?1225 12/29/21 ?6270 12/30/21 ?3500 12/31/21 ?0125 01/01/22 ?9381  ?NA 136 134* 137 135 137  ?K 4.1 4.7 4.1 4.2 3.8  ?CL 103 103 105 101 102  ?CO2 24 22 23 25 26   ?GLUCOSE 122* 148* 102* 99 102*  ?BUN 25* 27* 35* 37* 39*  ?CREATININE 2.43* 2.24* 2.34* 2.37* 2.33*  ?CALCIUM 8.8* 8.8* 8.8* 9.0 9.1  ?MG 1.8 2.4 2.2 2.0 2.1  ? ? ?Liver Function Tests: ?Recent Labs  ?Lab 12/28/21 ?1225  ?AST 22  ?ALT 12  ?ALKPHOS 98  ?BILITOT 0.9  ?PROT 7.0  ?ALBUMIN 3.2*  ? ?No results for input(s): LIPASE, AMYLASE in the last 168 hours. ?No results for input(s): AMMONIA in the last 168 hours. ? ?CBC: ?Recent Labs  ?Lab 12/28/21 ?1225 12/29/21 ?8299 12/30/21 ?3716 12/31/21 ?0125 01/01/22 ?9678  ?WBC 7.2 6.8 7.5 7.2 5.9  ?NEUTROABS 5.4  --   --   --   --   ?HGB 9.1* 8.6* 8.4* 8.4* 8.7*  ?HCT 29.3* 27.0* 26.0* 26.1* 26.8*  ?MCV 95.8 93.4 90.6 91.9 91.2  ?PLT 226 214 213 218 203  ? ? ?INR: ?Recent Labs  ?Lab 12/28/21 ?1225 12/29/21 ?9381 12/30/21 ?0175 12/31/21 ?0125 01/01/22 ?1025  ?INR 3.5* 3.5* 3.3* 2.9* 2.6*  ? ? ? ? ?Imaging  ? ?No results found. ? ? ?Medications:   ? ? ?Scheduled  Medications: ? aspirin EC  81 mg Oral q AM  ? Chlorhexidine Gluconate Cloth  6 each Topical Daily  ? citalopram  10 mg Oral q morning  ? fludrocortisone  0.1 mg Oral Daily  ? levothyroxine  175 mcg Oral Once per day on Mon Tue Wed Thu Fri Sat  ? [START ON 01/04/2022] levothyroxine  87.5 mcg Oral Once per day on Sun  ? mexiletine  150 mg Oral BID  ? midodrine  5 mg Oral TID WC  ? pantoprazole  40 mg Oral Daily  ? rosuvastatin  20 mg Oral q morning  ? sodium chloride flush  3 mL Intravenous Q12H  ? sotalol  80 mg Oral Daily  ? traZODone  50 mg Oral QHS  ? Warfarin - Pharmacist Dosing Inpatient   Does not apply G2694  ? ? ?Infusions: ? sodium chloride    ? ? ?PRN Medications: ?sodium chloride, acetaminophen, ondansetron (ZOFRAN) IV, sodium chloride flush, traMADol ? ? ?Patient Profile  ? ?81 y.o. male with history of CAD, chronic systolic CHF d/t  mixed ICM/NICM s/p HM II LVAD, recent R TKA 04/23, VT, orthostatic hypotension. Presenting with recurrent syncopal episodes. Found to be in VT.  Underwent emergent DC-CV in ED ? ?Assessment/Plan:   ? 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  ?- Off amio due to cerebellar side effects.  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  ?- Seen in ER 06/16/21 with recurrent VT. DC-CV x 1 ?- PVCs improved on mexilitene. Continue mexilitene 150 bid ?- Sotalol stopped recently due to bradycardia  ?- Recurrent VT s/p emergent DC-CV in ER on 12/28/21 ?- Restarted sotalol 80 mg daily. EP following EKGs during load    ?- No recurrent VT  ?- Keep K > 4.0 Mg > 2.0 ? ?2. Chronic systolic HF:  ?- ICM, EF 85-46% s/p LVAD HM II implant 08/2013 for DT.   ?- Echo 3/18 EF 10-15% mild AI. Mod RV dysfunction. LVAD cannula OK ?- has struggled with RV failure however RHC 11/22 looked good (despite evidence of RVF on LE dopplers showing reflux ?- NYHA III. Given IVFs in ED, volume felt low  ?- volume status ok on exam today  ?- Remains off all diuretics including spiro ?- Failed Jardiance due to volume depletion ?- He has not tolerated ACE-I or amlodipine or b-blocker in the past. ?  ?3. LVAD: HMII 08/2013 for DT. ?- VAD interrogated personally. Parameters stable. ?- Admitted in May 2018 with elevated LDH with bival.  ?- Continue aspirin and coumadin.  ?- Hgb 8.4, stable  ?- LDH 254>>324>>301>>297>>326 ?- DL site looks good ?- On warfarin INR 2.6 today Goal 2.0-3.0. Dosing per PharmD ?  ?4. 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  ?5. Painful left 2nd toe ?- uric acid normal, 6.7  ?  ?8. Atrial flutter, recurrent ?- patient paced out of AFL in 6/22. Atrial therapies enabled  ?- continue sotalol (on for VT). QTc slightly prolonged but stable ?- AC as above ?- may need to consider ablation if recurs ?  ?9. Chronic anticoagulation: INR goal 2.0-3.0.    ?- INR 2.6 today See above ?- Continue ASA 81 mg for LVAD + warfarin.  ?  ?10. HTN:  ?- MAPs  high, low 100s.  ?- reduced midodrine to 5 mg tid  ?- monitor response to reduced midodrine dose.  ?- orthostatic with OT yesterday. Denies dizziness. Florinef 0.1 was added ?  ?11. Hypothyroidism:  ?-  Followed by Dr Dwyane Dee. Stable. No change  ?  ?12. Hyperlipidemia:  ?- Continue Crestor. Zetia stopped due to cost.  ?- No change  ?  ?13. CAD ?- no s/s ischemia ?- continue Crestor ?  ?14. CKD IV ?- Creatinine baseline 1.7-2.0 ?- creatinine increasing recently has been low 2s ?- SCr this admit, 2.43>>2.24 >>2.34>>2.37>>2.33 ?- failed Jardiance ?- Has outpatient Nephrology referral ?  ?15. Debility ?- has been struggling since TKA ?- difficulty with ADLs ?- PT/OT evaluated. Outpatient PT recommended. Suspect may need HH PT. Still need multiple rest breaks with activity. ? ?Potentially stable for discharge tomorrow. Continue to mobilize with PT/OT today. Continue midodrine and florinef. ?  ? ?I reviewed the LVAD parameters from today, and compared the results to the patient's prior recorded data.  No programming changes were made.  The LVAD is functioning within specified parameters.  The patient performs LVAD self-test daily.  LVAD interrogation was negative for any significant power changes, alarms or PI events/speed drops.  LVAD equipment check completed and is in good working order.  Back-up equipment present.   LVAD education done on emergency procedures and precautions and reviewed exit site care. ? ?Length of Stay: 4 ? ?FINCH, LINDSAY N, PA-C ?01/01/2022, 9:18 AM ? ?VAD Team --- VAD ISSUES ONLY--- ?Pager [phone] (7am - 7am) ? ?Advanced Heart Failure Team  ?Pager [phone] (M-F; 7a - 5p)  ?Please contact [facility] for night-coverage after hours (5p -7a ) and weekends on [email or web address] ? ?Patient seen and examined with the above-signed Advanced Practice Provider and/or Housestaff. I personally reviewed laboratory data, imaging studies and relevant notes. I independently examined the patient and formulated the  important aspects of the plan. I have edited the note to reflect any of my changes or salient points. I have personally discussed the plan with the patient and/or family. ? ?Loading sotalol. No further VT. QT interv

## 2022-01-01 NOTE — Progress Notes (Signed)
ANTICOAGULATION CONSULT NOTE ? ?Pharmacy Consult for warfarin  ?Indication: LVAD ? ?Allergies  ?Allergen Reactions  ? Ace Inhibitors   ?  Hypotension and elevated creatinine  ? Amiodarone Other (See Comments)  ?  Cerebellar side effects  ? Diovan [Valsartan] Other (See Comments)  ?  Hypotension at low dose, hyperkalemia  ? Lipitor [Atorvastatin Calcium] Other (See Comments)  ?  Muscle pain  ? Jardiance [Empagliflozin] Other (See Comments)  ?  Unsure of exact side effect  ? Norvasc [Amlodipine] Other (See Comments)  ?  Put patient to sleep  ? ? ?Patient Measurements: ?Height: 5\' 7"  (170.2 cm) ?Weight: 68 kg (149 lb 14.6 oz) ?IBW/kg (Calculated) : 66.1 ? Heparin dosing weight: 71.1 kg ? ?Vital Signs: ?Temp: 97.9 ?F (36.6 ?C) (05/04 0800) ?Temp Source: Oral (05/04 0800) ?BP: 110/85 (05/04 0800) ?Pulse Rate: 85 (05/04 0800) ? ?Labs: ?Recent Labs  ?  12/30/21 ?0329 12/30/21 ?0603 12/31/21 ?0125 01/01/22 ?0108  ?HGB 8.4*  --  8.4* 8.7*  ?HCT 26.0*  --  26.1* 26.8*  ?PLT 213  --  218 203  ?LABPROT  --  33.6* 29.9* 27.6*  ?INR  --  3.3* 2.9* 2.6*  ?CREATININE 2.34*  --  2.37* 2.33*  ? ? ? ?Estimated Creatinine Clearance: 24.4 mL/min (A) (by C-G formula based on SCr of 2.33 mg/dL (H)). ? ? 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 Pharmacy consulted to dose warfarin.  ? ?INR therapeutic at 2.6, CBC and LDH stable - will resume home dosing. ? ?PTA warfarin dose: 5 mg daily ? 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  ?Plan:  ?Warfarin 5mg  x1 today ?Daily INR ? ? ?Arrie Senate, PharmD, BCPS, BCCP ?Clinical Pharmacist ?[phone] ?Please check AMION for all [facility] numbers ?01/01/2022 ? ? ?

## 2022-01-01 NOTE — TOC CM/SW Note (Signed)
HF TOC CM spoke to pt and agreeable to [facility]. Was active with [facility]. Notified [facility] rep, Marjory Lies with new referral for [place] at dc. Pt has RW at home. Jonnie Finner RN3 CCM, Heart Failure TOC CM [phone]  ?

## 2022-01-01 NOTE — Plan of Care (Signed)

## 2022-01-01 NOTE — Care Management Important Message (Signed)
Important Message ? ?Patient Details  ?Name: Johnny Oneill ?MRN: [id number] ?Date of Birth: 05-Oct-1940 ? ? ?Medicare Important Message Given:  Yes ? ? ? ? ?Shavonne Ambroise ?01/01/2022, 2:58 PM ?

## 2022-01-01 NOTE — Progress Notes (Signed)
Initial Nutrition Assessment ? ?DOCUMENTATION CODES:  ? ?Non-severe (moderate) malnutrition in context of chronic illness ? ?INTERVENTION:  ? ?- Continue liberalized Regular diet ? ?- Ensure Enlive po TID, each supplement provides 350 kcal and 20 grams of protein ? ?- MVI with minerals daily ? ?NUTRITION DIAGNOSIS:  ? ?Moderate Malnutrition related to chronic illness (CHF s/p LVAD, CKD IV) as evidenced by moderate fat depletion, moderate muscle depletion, percent weight loss (16% weight loss in less than 6 months). ? ?GOAL:  ? ?Patient will meet greater than or equal to 90% of their needs ? ?MONITOR:  ? ?PO intake, Supplement acceptance, Labs, Weight trends, I & O's ? ?REASON FOR ASSESSMENT:  ? ?Malnutrition Screening Tool ?  ? ?ASSESSMENT:  ? ?81 year old male who presented to the ED on 4/30 with weakness. 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 Pt admitted with recurrent VT. ? ?04/30 - s/p emergent DCCV ? ?Spoke with pt at bedside. Pt reports that for the first 2 days after admission, he had a very poor appetite, didn't want anything to eat, and had taste changes. Pt reports that the taste changes have improved significantly over the past 2 days and he is back to eating almost normally. Pt states that he consumed 75% of a chicken salad sandwich today for lunch and a bag of chips. ? ?Pt reports that he typically eats 2 main meals daily (lunch and dinner). Pt reports that he does not eat breakfast "unless I'm in the hospital or at the beach." In the morning, pt typically has an oatmeal cream pie with a Dr. Malachi Bonds for a snack. Lunch and dinner are more full/balanced meals with protein, starch, and vegetables. Pt also snacks throughout the day on items like ice cream and ice cream sandwiches. ? ?Pt reports that he usually weighs 164 lbs. He notes that he last lost about 14 lbs over the last week or so secondary to not eating and poor appetite. Pt reports that he was not volume  overloaded on admission and that the weight loss he has had since being here is true dry weight loss. ? ?Reviewed weight history in chart. Pt with a 4.8 kg weight loss since admission. This is a 6.7% weight loss in less than 1 week which is severe and significant for timeframe. Pt is net negative 2.1 L since admit so some of weight loss may be related to fluid status. Also noted weight has been slowly trending down over the last 6 months. Pt with a total weight loss of 12.7 kg since 06/19/21. This is a 16% weight loss in less than 6 months which is significant for timeframe. Based on NFPE and weight loss, pt meets criteria for moderate malnutrition. ? ?Pt willing to consume Ensure during admission. Will increase order from BID to TID between meals. Pt reports that he takes a MVI at home. RD will order a daily MVI with minerals here for continuity of care. Pt also reports taking vitamin D and a probiotic at home. ? ?Pt reports regular bowel movements (once daily) and denies N/V. RD offered information regarding high-calorie, high-protein nutrition therapy to facilitate weight gain after discharge. Pt politely declines, stating that he is not worried about his ability to gain weight after discharge. He feels like he will quickly gain weight back to his UBW once he goes home. ? ?Admit weight: 71.5 kg ?Current weight: 66.7 kg ? ?Meal Completion: 50-100% ? ?Medications reviewed and include:  protonix, klor-con 20 mEq once, warfarin ? ?Labs reviewed: BUN 39, creatinine 2.33, hemoglobin 8.7 ? ?UOP: 1750 ml x 24 hours ?I/O's: -2.1 L since ? ?NUTRITION - FOCUSED PHYSICAL EXAM: ? ?Flowsheet Row Most Recent Value  ?Orbital Region Moderate depletion  ?Upper Arm Region Moderate depletion  ?Thoracic and Lumbar Region Moderate depletion  ?Buccal Region Moderate depletion  ?Temple Region Mild depletion  ?Clavicle Bone Region Moderate depletion  ?Clavicle and Acromion Bone Region Moderate depletion  ?Scapular Bone Region Moderate  depletion  ?Dorsal Hand Mild depletion  ?Patellar Region Mild depletion  ?Anterior Thigh Region Moderate depletion  ?Posterior Calf Region Moderate depletion  ?Edema (RD Assessment) None  ?Hair Reviewed  ?Eyes Reviewed  ?Mouth Reviewed  ?Skin Reviewed  ?Nails Reviewed  ? ?  ? ? ?Diet Order:   ?Diet Order   ? ?       ?  Diet regular Room service appropriate? Yes; Fluid consistency: Thin  Diet effective now       ?  ? ?  ?  ? ?  ? ? ?EDUCATION NEEDS:  ? ?Education needs have been addressed ? ?Skin:  Skin Assessment: ?Skin Integrity Issues: ?Incisions: R knee ? ?Last BM:  12/30/21 ? ?Height:  ? ?Ht Readings from Last 1 Encounters:  ?12/28/21 5\' 7"  (1.702 m)  ? ? ?Weight:  ? ?Wt Readings from Last 1 Encounters:  ?01/01/22 66.7 kg  ? ? ?BMI:  Body mass index is 23.03 kg/m?. ? ?Estimated Nutritional Needs:  ? ?Kcal:  2000-2200 ? ?Protein:  100-115 grams ? ?Fluid:  2.0 L/day ? ? ? ?Gustavus Bryant, MS, RD, LDN ?Inpatient Clinical Dietitian ?Please see AMiON for contact information. ? ?

## 2022-01-02 ENCOUNTER — Other Ambulatory Visit (HOSPITAL_COMMUNITY): Payer: Self-pay

## 2022-01-02 LAB — BASIC METABOLIC PANEL
Anion gap: 7 (ref 5–15)
BUN: 45 mg/dL — ABNORMAL HIGH (ref 8–23)
CO2: 25 mmol/L (ref 22–32)
Calcium: 8.9 mg/dL (ref 8.9–10.3)
Chloride: 103 mmol/L (ref 98–111)
Creatinine, Ser: 2.51 mg/dL — ABNORMAL HIGH (ref 0.61–1.24)
GFR, Estimated: 26 mL/min — ABNORMAL LOW (ref >60)
Glucose, Bld: 110 mg/dL — ABNORMAL HIGH (ref 70–99)
Potassium: 4.3 mmol/L (ref 3.5–5.1)
Sodium: 135 mmol/L (ref 135–145)

## 2022-01-02 LAB — CBC
HCT: 27.6 % — ABNORMAL LOW (ref 39.0–52.0)
Hemoglobin: 8.9 g/dL — ABNORMAL LOW (ref 13.0–17.0)
MCH: 29.7 pg (ref 26.0–34.0)
MCHC: 32.2 g/dL (ref 30.0–36.0)
MCV: 92 fL (ref 80.0–100.0)
Platelets: 210 10*3/uL (ref 150–400)
RBC: 3 MIL/uL — ABNORMAL LOW (ref 4.22–5.81)
RDW: 15.2 % (ref 11.5–15.5)
WBC: 6.2 10*3/uL (ref 4.0–10.5)
nRBC: 0 % (ref 0.0–0.2)

## 2022-01-02 LAB — LACTATE DEHYDROGENASE: LDH: 326 U/L — ABNORMAL HIGH (ref 98–192)

## 2022-01-02 LAB — MAGNESIUM: Magnesium: 2 mg/dL (ref 1.7–2.4)

## 2022-01-02 LAB — PROTIME-INR
INR: 2.3 — ABNORMAL HIGH (ref 0.8–1.2)
Prothrombin Time: 25 seconds — ABNORMAL HIGH (ref 11.4–15.2)

## 2022-01-02 MED ORDER — FLUDROCORTISONE ACETATE 0.1 MG PO TABS
0.1000 mg | ORAL_TABLET | Freq: Every day | ORAL | 1 refills | Status: DC
  Filled 2022-01-02: qty 30, 30d supply, fill #0

## 2022-01-02 MED ORDER — MIDODRINE HCL 5 MG PO TABS
5.0000 mg | ORAL_TABLET | Freq: Three times a day (TID) | ORAL | 1 refills | Status: DC
Start: 2022-01-02 — End: 2022-01-28
  Filled 2022-01-02: qty 90, 30d supply, fill #0

## 2022-01-02 MED ORDER — SOTALOL HCL 80 MG PO TABS
80.0000 mg | ORAL_TABLET | Freq: Every day | ORAL | 1 refills | Status: DC
  Filled 2022-01-02: qty 30, 30d supply, fill #0

## 2022-01-02 MED ORDER — WARFARIN SODIUM 3 MG PO TABS
6.0000 mg | ORAL_TABLET | Freq: Once | ORAL | Status: AC
  Administered 2022-01-02: 6 mg via ORAL
  Filled 2022-01-02: qty 2

## 2022-01-02 NOTE — Progress Notes (Signed)
ANTICOAGULATION CONSULT NOTE ? ?Pharmacy Consult for warfarin  ?Indication: LVAD ? ?Allergies  ?Allergen Reactions  ? Ace Inhibitors   ?  Hypotension and elevated creatinine  ? Amiodarone Other (See Comments)  ?  Cerebellar side effects  ? Diovan [Valsartan] Other (See Comments)  ?  Hypotension at low dose, hyperkalemia  ? Lipitor [Atorvastatin Calcium] Other (See Comments)  ?  Muscle pain  ? Jardiance [Empagliflozin] Other (See Comments)  ?  Unsure of exact side effect  ? Norvasc [Amlodipine] Other (See Comments)  ?  Put patient to sleep  ? ? ?Patient Measurements: ?Height: 5\' 7"  (170.2 cm) ?Weight: 66.9 kg (147 lb 7.8 oz) ?IBW/kg (Calculated) : 66.1 ? Heparin dosing weight: 71.1 kg ? ?Vital Signs: ?Temp: 98 ?F (36.7 ?C) (05/05 0734) ?Temp Source: Oral (05/05 0734) ?BP: 126/98 (05/05 0734) ?Pulse Rate: 82 (05/05 0734) ? ?Labs: ?Recent Labs  ?  12/31/21 ?0125 01/01/22 ?0108 01/02/22 ?7225  ?HGB 8.4* 8.7* 8.9*  ?HCT 26.1* 26.8* 27.6*  ?PLT 218 203 210  ?LABPROT 29.9* 27.6* 25.0*  ?INR 2.9* 2.6* 2.3*  ?CREATININE 2.37* 2.33* 2.51*  ? ? ? ?Estimated Creatinine Clearance: 22.7 mL/min (A) (by C-G formula based on SCr of 2.51 mg/dL (H)). ? ? 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 Pharmacy consulted to dose warfarin.  ? ?INR just below goal at 2.3, CBC and LDH stable - will give a little extra warfarin today.  ? ?PTA warfarin dose: 5 mg daily ? 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  ?Plan:  ?Warfarin 6mg  x1 today ?Plan to resume prior to admit home dose at d/c ?Daily INR ? ? ?Erin Hearing PharmD., BCPS ?Clinical Pharmacist ?01/02/2022 11:13 AM ? ?

## 2022-01-02 NOTE — Progress Notes (Addendum)
?Advanced Heart Failure VAD Team Note ? ?PCP-Cardiologist: Dr. Haroldine Laws  ? ?Subjective:   ? ?4/30: admitted w/ VT. Underwent emergent DCCV.   ?5/1: Sotalol resumed. EP following  ?5/2: Midodrine reduced to 5 tid given elevated MAPs ?5/3: Florinef added ? ?MAPs 90s-100s ? ?Scr mildly up 2.33>2.51 ? ?INR 2.3 ? ?No dyspnea. Has been up ambulating without dizziness. Does feel weak.  ? ?LVAD INTERROGATION:  ?HeartMate II LVAD:   ?Flow 3.7  liters/min, speed 9600, power 5, PI 7. No alarms. ? ?Objective:   ? ?Vital Signs:   ?Temp:  [97.6 ?F (36.4 ?C)-98.2 ?F (36.8 ?C)] 98 ?F (36.7 ?C) (05/05 0734) ?Pulse Rate:  [70-84] 82 (05/05 0734) ?Resp:  [13-19] 13 (05/05 0734) ?BP: (112-126)/(90-99) 126/98 (05/05 0734) ?SpO2:  [96 %-100 %] 100 % (05/05 0734) ?Weight:  [66.7 kg-66.9 kg] 66.9 kg (05/05 0607) ?Last BM Date : 01/01/22 ?Mean arterial Pressure MAPs 90s-100s ? ?Intake/Output:  ? ?Intake/Output Summary (Last 24 hours) at 01/02/2022 1108 ?Last data filed at 01/02/2022 0800 ?Gross per 24 hour  ?Intake 598 ml  ?Output 750 ml  ?Net -152 ml  ?  ? ?Physical Exam  ?  ?Physical Exam: ?GENERAL: No distress. Lying in bed. ?HEENT: normal  ?NECK: Supple, JVP flat.  2+ bilaterally, no bruits.  ?CARDIAC:  Mechanical heart sounds with LVAD hum present.  ?LUNGS:  Clear to auscultation bilaterally.  ?ABDOMEN:  Soft, round, nontender, positive bowel sounds x4.     ?LVAD exit site: well-healed and incorporated.  Dressing dry and intact.  No erythema or drainage.  Stabilization device present and accurately applied.  Driveline dressing is being changed daily per sterile technique. ?EXTREMITIES:  Warm and dry, no cyanosis, clubbing, rash or edema  ?NEUROLOGIC:  Alert and oriented x 4.  Gait steady.  No aphasia.  No dysarthria.  Affect pleasant.    ? ? ? ? ?Telemetry  ? ?AV paced 80s, 3 short runs NSVT this am ? ?EKG  ?  ?No new EKG to review  ? ?Labs  ? ?Basic Metabolic Panel: ?Recent Labs  ?Lab 12/29/21 ?5284 12/30/21 ?1324 12/31/21 ?0125  01/01/22 ?4010 01/02/22 ?2725  ?NA 134* 137 135 137 135  ?K 4.7 4.1 4.2 3.8 4.3  ?CL 103 105 101 102 103  ?CO2 22 23 25 26 25   ?GLUCOSE 148* 102* 99 102* 110*  ?BUN 27* 35* 37* 39* 45*  ?CREATININE 2.24* 2.34* 2.37* 2.33* 2.51*  ?CALCIUM 8.8* 8.8* 9.0 9.1 8.9  ?MG 2.4 2.2 2.0 2.1 2.0  ? ? ?Liver Function Tests: ?Recent Labs  ?Lab 12/28/21 ?1225  ?AST 22  ?ALT 12  ?ALKPHOS 98  ?BILITOT 0.9  ?PROT 7.0  ?ALBUMIN 3.2*  ? ?No results for input(s): LIPASE, AMYLASE in the last 168 hours. ?No results for input(s): AMMONIA in the last 168 hours. ? ?CBC: ?Recent Labs  ?Lab 12/28/21 ?1225 12/29/21 ?3664 12/30/21 ?4034 12/31/21 ?0125 01/01/22 ?7425 01/02/22 ?9563  ?WBC 7.2 6.8 7.5 7.2 5.9 6.2  ?NEUTROABS 5.4  --   --   --   --   --   ?HGB 9.1* 8.6* 8.4* 8.4* 8.7* 8.9*  ?HCT 29.3* 27.0* 26.0* 26.1* 26.8* 27.6*  ?MCV 95.8 93.4 90.6 91.9 91.2 92.0  ?PLT 226 214 213 218 203 210  ? ? ?INR: ?Recent Labs  ?Lab 12/29/21 ?8756 12/30/21 ?4332 12/31/21 ?0125 01/01/22 ?9518 01/02/22 ?8416  ?INR 3.5* 3.3* 2.9* 2.6* 2.3*  ? ? ? ? ?Imaging  ? ?No results found. ? ? ?Medications:   ? ? ?  Scheduled Medications: ? aspirin EC  81 mg Oral q AM  ? Chlorhexidine Gluconate Cloth  6 each Topical Daily  ? citalopram  10 mg Oral q morning  ? feeding supplement  237 mL Oral TID BM  ? fludrocortisone  0.1 mg Oral Daily  ? levothyroxine  175 mcg Oral Once per day on Mon Tue Wed Thu Fri Sat  ? [START ON 01/04/2022] levothyroxine  87.5 mcg Oral Once per day on Sun  ? mexiletine  150 mg Oral BID  ? midodrine  5 mg Oral TID WC  ? multivitamin with minerals  1 tablet Oral Daily  ? pantoprazole  40 mg Oral Daily  ? rosuvastatin  20 mg Oral q morning  ? sodium chloride flush  3 mL Intravenous Q12H  ? sotalol  80 mg Oral Daily  ? traZODone  50 mg Oral QHS  ? Warfarin - Pharmacist Dosing Inpatient   Does not apply Y7829  ? ? ?Infusions: ? sodium chloride    ? ? ?PRN Medications: ?sodium chloride, acetaminophen, ondansetron (ZOFRAN) IV, sodium chloride flush,  traMADol ? ? ?Patient Profile  ? ?81 y.o. 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 Presenting with recurrent syncopal episodes. Found to be in VT.  Underwent emergent DC-CV in ED ? ?Assessment/Plan:   ? 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  ?- Off amio due to cerebellar side effects.  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  ?- Seen in ER 06/16/21 with recurrent VT. DC-CV x 1 ?- PVCs improved on mexilitene. Continue mexilitene 150 bid ?- Sotalol stopped recently due to bradycardia  ?- Recurrent VT s/p emergent DC-CV in ER on 12/28/21 ?- Restarted sotalol 80 mg daily. EP followed EKGs during load. Qtc stable. ?- No recurrent VT  ?- Keep K > 4.0 Mg > 2.0 ? ?2. Chronic systolic HF:  ?- ICM, EF 56-21% s/p LVAD HM II implant 08/2013 for DT.   ?- Echo 3/18 EF 10-15% mild AI. Mod RV dysfunction. LVAD cannula OK ?- has struggled with RV failure however RHC 11/22 looked good (despite evidence of RVF on LE dopplers showing reflux ?- NYHA III. Given IVFs in ED, volume felt low  ?- volume status ok on exam today  ?- Remains off all diuretics including spiro ?- Failed Jardiance due to volume depletion ?- He has not tolerated ACE-I or amlodipine or b-blocker in the past. ?  ?3. LVAD: HMII 08/2013 for DT. ?- VAD interrogated personally. Parameters stable. ?- Admitted in May 2018 with elevated LDH with bival.  ?- Continue aspirin and coumadin.  ?- Hgb 8.4, stable  ?- LDH 254>>324>>301>>297>>326 ?- DL site looks good ?- On warfarin INR 2.3 today Goal 2.0-3.0. Dosing per PharmD ?  ?4. 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  ?5. Painful left 2nd toe ?- uric acid normal, 6.7  ?  ?8. Atrial flutter, recurrent ?- patient paced out of AFL in 6/22. Atrial therapies enabled  ?- continue sotalol (on for VT). QTc slightly prolonged but stable ?- AC as above ?- may need to consider ablation if recurs ?  ?9. Chronic anticoagulation: INR goal  2.0-3.0.    ?- INR 2.3 today See above ?- Continue ASA 81 mg for LVAD + warfarin.  ?  ?10. HTN with orthostatic hypotension:  ?- MAPs high, low 100s.  ?- reduced midodrine to 5 mg tid. Florinef 0.1  was added ?- Seems to be responding well to current regimen. No recurrent dizziness. ?  ?11. Hypothyroidism:  ?- Followed by Dr Dwyane Dee. Stable. No change  ?  ?12. Hyperlipidemia:  ?- Continue Crestor. Zetia stopped due to cost.  ?- No change  ?  ?13. CAD ?- no s/s ischemia ?- continue Crestor ?  ?14. CKD IV ?- Creatinine baseline 1.7-2.0 ?- creatinine increasing recently has been low 2s ?- SCr this admit, 2.43>>2.24 >>2.34>>2.37>>2.33>2.5 ?- failed Jardiance ?- Has outpatient Nephrology referral ?  ?15. Debility ?- has been struggling since TKA ?- difficulty with ADLs ?- PT/OT evaluated. HH PT recommend. Will arrange. ? ?Stable for discharge later today. ?  ? ?I reviewed the LVAD parameters from today, and compared the results to the patient's prior recorded data.  No programming changes were made.  The LVAD is functioning within specified parameters.  The patient performs LVAD self-test daily.  LVAD interrogation was negative for any significant power changes, alarms or PI events/speed drops.  LVAD equipment check completed and is in good working order.  Back-up equipment present.   LVAD education done on emergency procedures and precautions and reviewed exit site care. ? ?Length of Stay: 5 ? ?FINCH, LINDSAY N, PA-C ?01/02/2022, 11:08 AM ? ?VAD Team --- VAD ISSUES ONLY--- ?Pager [phone] (7am - 7am) ? ?Advanced Heart Failure Team  ?Pager [phone] (M-F; 7a - 5p)  ?Please contact [facility] for night-coverage after hours (5p -7a ) and weekends on [email or web address] ? ?Patient seen and examined with the above-signed Advanced Practice Provider and/or Housestaff. I personally reviewed laboratory data, imaging studies and relevant notes. I independently examined the patient and formulated the important aspects of the plan. I have  edited the note to reflect any of my changes or salient points. I have personally discussed the plan with the patient and/or family. ? ?Feels good. BP stable. No orthostasis.Sotalol loaded. QT stable. Occasional brief NSVT.

## 2022-01-02 NOTE — Progress Notes (Signed)
LVAD Coordinator Rounding Note: ? ?Admitted 12/28/21 from ED to Dr. Haroldine Laws service for VT. ? 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 Darcey [name] under bridge to transplant criteria. Patient was removed from transplant list 05/2016 per his choice. He is currently Destination Therapy LVAD.  ? ?Pts wife paged VAD pager over the weekend w/ c/o weakness and low flows. Pt presented to ED in VT. Cardioversion performed in ED by Dr Haroldine Laws ? ?Met pt walking in the hallway. Pt states that he is feeling much better, but feels "weak" when walking. He attributes this to "losing 14 lbs of muscle" since surgery. While walking he took a few standing rest breaks to rest his arms from using the walker. Denied lightheadedness or dizziness with ambulation. Pt was denied for inpatient rehab. PT recommending outpatient PT.  ? ?Maintaining NSR. No further VT. EP following EKGs w/ sotalol loading. Currently taking Sotalol 80 mg daily.  ? ?BP stable this morning. Midodrine dosing at 5 mg TID and Florinef 0.1 mg daily. Denied orthostatic symptoms when standing/walking yesterday and this morning.  ? ?Home health PT established with [facility]. Will continue at discharge per [facility] CSW note 01/01/22.  ? ?VAD clinic f/u scheduled for 01/08/22 at 10:00.  ? ?Vital signs       ?Temp: 98.0 ?HR: 82   ?Doppler Pressure: 108     ?Automatic BP: 126/98 (108)    ?O2 Sat: 100% on RA ?Wt:  157.6>154.1>149.9>147.4 lbs ? ?LVAD interrogation reveals:  ?Speed: 9600    ?Flow: 4.6    ?Power: 5.7 w   ?PI: 5.7     ? ?Alarms: none ?Events: 1 PI event today; 9 PI events yesterday ? ?Fixed speed:  9600 ?Low speed limit: 9000 ? ?Drive Line:  Drive line is being maintained weekly by [name], his wife. Existing VAD dressing clean, dry, and intact. Drive line anchor correctly applied. Weekly dressing changes per [facility] nurse or caregiver. Next dressing change due 01/08/22. ? ?Labs:  ?LDH trend: 324>301>297>326>326 ? ?INR trend: 3.5>3.3>2.9>2.6>2.3 ? ?Anticoagulation Plan: ?-INR  Goal:  2.5 - 3.0 ?-ASA Dose: 81 mg daily ? ? ?Device: [employer]tronic dual ?- Therapies: on 231 bpm ?- Pacing:  AAI<=>DDD 80 bpm ?- Last check: in ED per [employer] rep on 12/18/21 - rate response turned on ? ? ?Plan/Recommendations:  ?Call VAD Coordinator with any VAD equipment or drive line issues. ?Weekly dressing changes per [facility] nurse or caregiver.  ?3.   Hosp f/u made for next Thursday at 1000am ? ?Emerson Monte RN ?VAD Coordinator  ?Office: [phone]  ?24/7 Pager: [phone]  ? ? ? ?

## 2022-01-02 NOTE — Consult Note (Signed)
? ?  [facility] CM Inpatient Consult ? ? ?01/02/2022 ? ?[name] ?March 31, 1941 ?[id number] ? ?[facility] [place] [facility] [ACO] Patient: Medicare ACO REACH ? ?Primary Care Provider:  Jolaine Artist, MD, Advanced HFVAD Team ? ? ?Chart review for Medicare readmission and patient remains active with the Advanced Heart Failure LVAD team and follow up with the LVAD team ongoing. No further needs noted. ? ?Natividad Brood, RN BSN CCM ?[facility] Liaison ? [phone] business mobile phone ?Toll free office [phone]  ?Fax number: [phone] ?[email or web address] ? ? ?

## 2022-01-02 NOTE — TOC Initial Note (Signed)
Transition of Care (TOC) - Initial/Assessment Note  ? ? ?Patient Details  ?Name: Johnny Oneill ?MRN: [id number] ?Date of Birth: December 08, 1940 ? ?Transition of Care [facility]) CM/SW Contact:    ?Marcheta Grammes Rexene Alberts, RN ?Phone Number: [phone] ?01/02/2022, 2:39 PM ? ?Clinical Narrative:                 ?HF TOC CM notified [place] of scheduled dc home with [facility]. Wife will assist pt at home.  ? ?Expected Discharge Plan: [place] ?Barriers to Discharge: No Barriers Identified ? ? ?Patient Goals and CMS Choice ?Patient states their goals for this hospitalization and ongoing recovery are:: wants to remain independent ?CMS Medicare.gov Compare Post Acute Care list provided to:: Patient ?Choice offered to / list presented to : Patient ? ?Expected Discharge Plan and Services ?Expected Discharge Plan: [place] ?  ?Discharge Planning Services: CM Consult ?Post Acute Care Choice: Home Health ?Living arrangements for the past 2 months: [place] ?Expected Discharge Date: 01/02/22               ?  ?  ?  ?  ?  ?HH Arranged: PT, RN ?[place] Agency: [facility] ?Date HH Agency Contacted: 01/02/22 ?Time [place]: 9767 ?Representative spoke with at [place]: Romie Jumper ? ?Prior Living Arrangements/Services ?Living arrangements for the past 2 months: [place] ?Lives with:: Spouse ?Patient language and need for interpreter reviewed:: Yes ?Do you feel safe going back to the place where you live?: Yes      ?Need for Family Participation in Patient Care: Yes (Comment) ?Care giver support system in place?: Yes (comment) ?Current home services: DME Librarian, academic) ?Criminal Activity/Legal Involvement Pertinent to Current Situation/Hospitalization: No - Comment as needed ? ?Activities of Daily Living ?Home Assistive Devices/Equipment: Gilford Rile (specify type) ?ADL Screening (condition at time of admission) ?Patient's cognitive ability adequate to safely complete daily  activities?: Yes ?Is the patient deaf or have difficulty hearing?: No ?Does the patient have difficulty seeing, even when wearing glasses/contacts?: No ?Does the patient have difficulty concentrating, remembering, or making decisions?: No ?Patient able to express need for assistance with ADLs?: Yes ?Does the patient have difficulty dressing or bathing?: No ?Independently performs ADLs?: Yes (appropriate for developmental age) ?Does the patient have difficulty walking or climbing stairs?: No ?Weakness of Legs: Right ?Weakness of Arms/Hands: None ? ?Permission Sought/Granted ?Permission sought to share information with : Case Manager, Family Supports, PCP ?Permission granted to share information with : Yes, Verbal Permission Granted ? Share Information with NAME: [name] ? Permission granted to share info w AGENCY: [place] ? Permission granted to share info w Relationship: wife ? Permission granted to share info w Contact Information: [phone] ? ?Emotional Assessment ?Appearance:: Appears stated age ?Attitude/Demeanor/Rapport: Engaged ?Affect (typically observed): Accepting ?Orientation: : Oriented to Self, Oriented to Place, Oriented to  Time, Oriented to Situation ?  ?Psych Involvement: No (comment) ? ?Admission diagnosis:  VT (ventricular tachycardia) ([place]) [I47.20] ?V-tach ([place]) [I47.20] ?Patient Active Problem List  ? Diagnosis Date Noted  ? Syncope and collapse 12/18/2021  ? CKD (chronic kidney disease), stage III ([place]) 06/16/2021  ? Chronic systolic CHF (congestive heart failure) ([place]) 01/02/2015  ? Decreased appetite 11/01/2014  ? Contact dermatitis 09/11/2014  ? Inguinal hernia 08/21/2014  ? Hemothorax 02/08/2014  ? HTN (hypertension) 01/30/2014  ? Chronic anticoagulation 01/08/2014  ? Hypothyroid 12/20/2013  ? Syncope 11/28/2013  ? Hemothorax on right 11/06/2013  ?  Poor venous access 11/02/2013  ? LVAD (left ventricular assist device) present ([place]) 09/18/2013  ? Weakness generalized 09/15/2013  ?  Nonspecific (abnormal) findings on radiological and other examination of gastrointestinal tract 09/14/2013  ? Anemia, unspecified 09/14/2013  ? 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 PVC (premature ventricular contraction) 12/08/2012  ? VT (ventricular tachycardia) 12/08/2012  ? Essential hypertension, benign 11/12/2010  ? ISCHEMIC CARDIOMYOPATHY 11/12/2010  ? Chronic systolic heart failure ([place]) 11/12/2010  ? Ischemic cardiomyopathy   ? Myocardial infarction of lateral wall (HCC)   ? Hypercholesteremia   ? Automatic implantable cardioverter-defibrillator in situ   ? CAD (coronary artery disease)   ? Osteoarthritis   ? ?PCP:  Bensimhon, Shaune Pascal, MD ?Pharmacy:   ?[facility] [place] ?[address] [place] ?[place] [address] ?Phone: [phone] Fax: [phone] ? ?[facility] [address] [place], [place] ?[place] ?[place] [address] ?Phone: [phone] Fax: [phone] ? ?[facility] ?[address] ?[place] [address] ?Phone: [phone] Fax: [phone] ? ? ? ? ?Social Determinants of Health (SDOH) Interventions ?  ? ?Readmission Risk Interventions ?   ? View : No data to display.  ?  ?  ?  ? ? ? ?

## 2022-01-02 NOTE — Progress Notes (Signed)
Discharge instructions given to patient with wife [name] at bedside. Patient understands all medication changes and up coming appointments. 1 PIV removed without complication.   ?

## 2022-01-02 NOTE — Plan of Care (Signed)

## 2022-01-02 NOTE — Progress Notes (Signed)
Mobility Specialist: Progress Note ? ? 01/02/22 1239  ?Mobility  ?Activity Ambulated with assistance in hallway  ?Level of Assistance Minimal assist, patient does 75% or more  ?Assistive Device Front wheel walker  ?RLE Weight Bearing WBAT  ?Distance Ambulated (ft) 340 ft  ?Activity Response Tolerated well  ?$Mobility charge 1 Mobility  ? ?Pt received in bed and agreeable to ambulation. Stopped x1 secondary to BUE fatigue, otherwise asymptomatic. Pt back to bed after session with call bell at his side.  ? ?Johnny Oneill ?Mobility Specialist ?Mobility Specialist [place]: [phone] ?Mobility Specialist [place]: [phone] ? ?

## 2022-01-03 DIAGNOSIS — I255 Ischemic cardiomyopathy: Secondary | ICD-10-CM | POA: Diagnosis not present

## 2022-01-03 DIAGNOSIS — I252 Old myocardial infarction: Secondary | ICD-10-CM | POA: Diagnosis not present

## 2022-01-03 DIAGNOSIS — M109 Gout, unspecified: Secondary | ICD-10-CM | POA: Diagnosis not present

## 2022-01-03 DIAGNOSIS — F419 Anxiety disorder, unspecified: Secondary | ICD-10-CM | POA: Diagnosis not present

## 2022-01-03 DIAGNOSIS — I4510 Unspecified right bundle-branch block: Secondary | ICD-10-CM | POA: Diagnosis not present

## 2022-01-03 DIAGNOSIS — I2510 Atherosclerotic heart disease of native coronary artery without angina pectoris: Secondary | ICD-10-CM | POA: Diagnosis not present

## 2022-01-03 DIAGNOSIS — I4892 Unspecified atrial flutter: Secondary | ICD-10-CM | POA: Diagnosis not present

## 2022-01-03 DIAGNOSIS — Z7982 Long term (current) use of aspirin: Secondary | ICD-10-CM | POA: Diagnosis not present

## 2022-01-03 DIAGNOSIS — E039 Hypothyroidism, unspecified: Secondary | ICD-10-CM | POA: Diagnosis not present

## 2022-01-03 DIAGNOSIS — Z471 Aftercare following joint replacement surgery: Secondary | ICD-10-CM | POA: Diagnosis not present

## 2022-01-03 DIAGNOSIS — I5022 Chronic systolic (congestive) heart failure: Secondary | ICD-10-CM | POA: Diagnosis not present

## 2022-01-03 DIAGNOSIS — Z7901 Long term (current) use of anticoagulants: Secondary | ICD-10-CM | POA: Diagnosis not present

## 2022-01-03 DIAGNOSIS — I428 Other cardiomyopathies: Secondary | ICD-10-CM | POA: Diagnosis not present

## 2022-01-03 DIAGNOSIS — I493 Ventricular premature depolarization: Secondary | ICD-10-CM | POA: Diagnosis not present

## 2022-01-03 DIAGNOSIS — I472 Ventricular tachycardia, unspecified: Secondary | ICD-10-CM | POA: Diagnosis not present

## 2022-01-03 DIAGNOSIS — Z7952 Long term (current) use of systemic steroids: Secondary | ICD-10-CM | POA: Diagnosis not present

## 2022-01-03 DIAGNOSIS — E7800 Pure hypercholesterolemia, unspecified: Secondary | ICD-10-CM | POA: Diagnosis not present

## 2022-01-03 DIAGNOSIS — D631 Anemia in chronic kidney disease: Secondary | ICD-10-CM | POA: Diagnosis not present

## 2022-01-03 DIAGNOSIS — Z96651 Presence of right artificial knee joint: Secondary | ICD-10-CM | POA: Diagnosis not present

## 2022-01-03 DIAGNOSIS — N184 Chronic kidney disease, stage 4 (severe): Secondary | ICD-10-CM | POA: Diagnosis not present

## 2022-01-03 DIAGNOSIS — M79675 Pain in left toe(s): Secondary | ICD-10-CM | POA: Diagnosis not present

## 2022-01-03 DIAGNOSIS — I951 Orthostatic hypotension: Secondary | ICD-10-CM | POA: Diagnosis not present

## 2022-01-03 DIAGNOSIS — I130 Hypertensive heart and chronic kidney disease with heart failure and stage 1 through stage 4 chronic kidney disease, or unspecified chronic kidney disease: Secondary | ICD-10-CM | POA: Diagnosis not present

## 2022-01-03 DIAGNOSIS — K4090 Unilateral inguinal hernia, without obstruction or gangrene, not specified as recurrent: Secondary | ICD-10-CM | POA: Diagnosis not present

## 2022-01-03 DIAGNOSIS — K769 Liver disease, unspecified: Secondary | ICD-10-CM | POA: Diagnosis not present

## 2022-01-05 ENCOUNTER — Telehealth (HOSPITAL_COMMUNITY): Payer: Self-pay | Admitting: *Deleted

## 2022-01-05 ENCOUNTER — Other Ambulatory Visit (HOSPITAL_COMMUNITY): Payer: Self-pay

## 2022-01-05 DIAGNOSIS — D631 Anemia in chronic kidney disease: Secondary | ICD-10-CM | POA: Diagnosis not present

## 2022-01-05 DIAGNOSIS — N184 Chronic kidney disease, stage 4 (severe): Secondary | ICD-10-CM | POA: Diagnosis not present

## 2022-01-05 DIAGNOSIS — I951 Orthostatic hypotension: Secondary | ICD-10-CM | POA: Diagnosis not present

## 2022-01-05 DIAGNOSIS — I5022 Chronic systolic (congestive) heart failure: Secondary | ICD-10-CM | POA: Diagnosis not present

## 2022-01-05 DIAGNOSIS — I130 Hypertensive heart and chronic kidney disease with heart failure and stage 1 through stage 4 chronic kidney disease, or unspecified chronic kidney disease: Secondary | ICD-10-CM | POA: Diagnosis not present

## 2022-01-05 DIAGNOSIS — Z471 Aftercare following joint replacement surgery: Secondary | ICD-10-CM | POA: Diagnosis not present

## 2022-01-05 NOTE — Telephone Encounter (Signed)
Trisha Mangle, [facility] Physical Therapist called VAD office and left message asking for orders as follows: ? ? Home PT 2 x week for 4 weeks; followed by PT 1 x week for 2 weeks ? ?Called Elta Guadeloupe Byus back at [phone] and left message with verbal orders from Dr. Haroldine Laws for orders as listed above.  ? ?Asked Elta Guadeloupe to call VAD office back if any questions or concerns. ? ?Zada Girt RN, VAD Coordinator ?[phone] ?

## 2022-01-06 ENCOUNTER — Other Ambulatory Visit (HOSPITAL_COMMUNITY): Payer: Self-pay | Admitting: *Deleted

## 2022-01-06 DIAGNOSIS — Z95811 Presence of heart assist device: Secondary | ICD-10-CM

## 2022-01-06 DIAGNOSIS — Z7901 Long term (current) use of anticoagulants: Secondary | ICD-10-CM

## 2022-01-06 DIAGNOSIS — I5022 Chronic systolic (congestive) heart failure: Secondary | ICD-10-CM

## 2022-01-06 NOTE — Progress Notes (Deleted)
Electrophysiology Office Note Date: 01/06/2022  ID:  Johnny Oneill, DOB 1940-12-27, MRN [id number]  PCP: Jolaine Artist, MD Primary Cardiologist: None Electrophysiologist: Will Meredith Leeds, MD   CC: Routine ICD follow-up  Johnny Oneill is a 81 y.o. male seen today for Will Meredith Leeds, MD for post hospital follow up.  Since discharge from hospital the patient reports doing ***.  he denies chest pain, palpitations, dyspnea, PND, orthopnea, nausea, vomiting, dizziness, syncope, edema, weight gain, or early satiety. {He/she (caps):30048} has not had ICD shocks.   Device History: Medtronic Dual Chamber ICD implanted 2009, gen change 06/2019 History of appropriate therapy: Yes History of AAD therapy: Yes; previously stopped amiodarone due to cerebellar effects Sotalol and Mexitil current  Past Medical History:  Diagnosis Date   Anxiety    Automatic implantable cardioverter-defibrillator in situ    CAD (coronary artery disease)    S/P stenting of LCX in 2004;  s/p Lat MI 2009 with occlusion of the LCX - treated with Promus stenting. Has total occlusion of the RCA.    CHF (congestive heart failure) (HCC)    Hypercholesteremia    Hypertension    Hypothyroidism    ICD (implantable cardiac defibrillator) in place    PROPHYLACTIC      medtronic   Inferior MI ([place]) "? date"; 2009   Ischemic cardiomyopathy    a. 10/09 Echo: Sev LV Dysfxn, inf/lat AK, Mod MR.   Liver disease    LVAD (left ventricular assist device) present ([place])    Osteoarthritis    a. s/p R TKR   Pacemaker    PVC (premature ventricular contraction)    RBBB    Shortness of breath    Past Surgical History:  Procedure Laterality Date   CARPAL TUNNEL RELEASE Right 05/29/2016   Procedure: CARPAL TUNNEL RELEASE;  Surgeon: Earlie Server, MD;  Location: [place];  Service: Orthopedics;  Laterality: Right;   CORONARY ANGIOPLASTY WITH STENT PLACEMENT  2004   CORONARY ANGIOPLASTY WITH STENT PLACEMENT  07/2008    DEFIBRILLATOR  2009   ESOPHAGOGASTRODUODENOSCOPY N/A 09/15/2013   Procedure: ESOPHAGOGASTRODUODENOSCOPY (EGD);  Surgeon: Ladene Artist, MD;  Location: [facility] ENDOSCOPY;  Service: Endoscopy;  Laterality: N/A;  bedside   HEMATOMA EVACUATION Right 11/06/2013   Procedure: EVACUATION HEMATOMA;  Surgeon: Gaye Pollack, MD;  Location: [place];  Service: Cardiothoracic;  Laterality: Right;   ICD GENERATOR CHANGEOUT N/A 06/28/2019   Procedure: ICD GENERATOR CHANGEOUT;  Surgeon: Constance Haw, MD;  Location: [place] CV LAB;  Service: Cardiovascular;  Laterality: N/A;   INSERT / REPLACE / REMOVE PACEMAKER  2009   INSERTION OF IMPLANTABLE LEFT VENTRICULAR ASSIST DEVICE N/A 09/18/2013   Procedure: INSERTION OF IMPLANTABLE LEFT VENTRICULAR ASSIST DEVICE;  Surgeon: Ivin Poot, MD;  Location: [place];  Service: Open Heart Surgery;  Laterality: N/A;  CIRC ARREST  NITRIC OXIDE   INTRA-AORTIC BALLOON PUMP INSERTION N/A 09/14/2013   Procedure: INTRA-AORTIC BALLOON PUMP INSERTION;  Surgeon: Jolaine Artist, MD;  Location: [facility] CATH LAB;  Service: Cardiovascular;  Laterality: N/A;   INTRAOPERATIVE TRANSESOPHAGEAL ECHOCARDIOGRAM N/A 09/18/2013   Procedure: INTRAOPERATIVE TRANSESOPHAGEAL ECHOCARDIOGRAM;  Surgeon: Ivin Poot, MD;  Location: [place];  Service: Open Heart Surgery;  Laterality: N/A;   KNEE ARTHROSCOPY     bilaterally   KNEE ARTHROSCOPY W/ PARTIAL MEDIAL MENISCECTOMY  12/2002   left   LEFT VENTRICULAR ASSIST DEVICE     RIGHT HEART CATH N/A 07/28/2021   Procedure: RIGHT HEART  CATH;  Surgeon: Jolaine Artist, MD;  Location: [place] CV LAB;  Service: Cardiovascular;  Laterality: N/A;   RIGHT HEART CATHETERIZATION N/A 08/25/2013   Procedure: RIGHT HEART CATH;  Surgeon: Jolaine Artist, MD;  Location[facility] CATH LAB;  Service: Cardiovascular;  Laterality: N/A;   RIGHT HEART CATHETERIZATION N/A 09/13/2013   Procedure: RIGHT HEART CATH;  Surgeon: Jolaine Artist, MD;  Location: [facility] CATH LAB;   Service: Cardiovascular;  Laterality: N/A;   RIGHT HEART CATHETERIZATION N/A 02/08/2014   Procedure: RIGHT HEART CATH;  Surgeon: Jolaine Artist, MD;  Location: [facility] CATH LAB;  Service: Cardiovascular;  Laterality: N/A;   RIGHT HEART CATHETERIZATION N/A 02/12/2014   Procedure: RIGHT HEART CATH;  Surgeon: Jolaine Artist, MD;  Location: [facility] CATH LAB;  Service: Cardiovascular;  Laterality: N/A;   TOTAL KNEE ARTHROPLASTY  11/27/11   left   TOTAL KNEE ARTHROPLASTY  11/27/2011   Procedure: TOTAL KNEE ARTHROPLASTY;  Surgeon: Yvette Rack., MD;  Location: [place];  Service: Orthopedics;  Laterality: Left;  left total knee arthroplasty   TOTAL KNEE ARTHROPLASTY Right 12/03/2021   Procedure: TOTAL KNEE ARTHROPLASTY;  Surgeon: Earlie Server, MD;  Location: [place];  Service: Orthopedics;  Laterality: Right;   VIDEO ASSISTED THORACOSCOPY Right 11/06/2013   Procedure: VIDEO ASSISTED THORACOSCOPY;  Surgeon: Gaye Pollack, MD;  Location[facility] OR;  Service: Cardiothoracic;  Laterality: Right;    Current Outpatient Medications  Medication Sig Dispense Refill   acetaminophen (TYLENOL) 500 MG tablet Take 1,000 mg by mouth every 6 (six) hours as needed (pain).     aspirin EC 81 MG tablet Take 81 mg by mouth in the morning.     Cholecalciferol (VITAMIN D) 125 MCG (5000 UT) CAPS Take 5,000 Units by mouth in the morning.     citalopram (CELEXA) 10 MG tablet Take 1 tablet by mouth once daily (Patient taking differently: Take 10 mg by mouth every morning.) 90 tablet 3   Coenzyme Q10 (COQ10) 200 MG CAPS Take 200 mg by mouth in the morning.     Famotidine (PEPCID PO) Take 1 tablet by mouth every morning.     fludrocortisone (FLORINEF) 0.1 MG tablet Take 1 tablet (0.1 mg total) by mouth daily. 30 tablet 1   furosemide (LASIX) 20 MG tablet Take 1 tablet (20 mg total) by mouth daily as needed (Take 20 mg as daily as needed for wt > 153 lbs). Reported on 01/14/2016 (Patient not taking: Reported on 11/26/2021) 30 tablet 6    melatonin 5 MG TABS Take 5 mg by mouth at bedtime.     mexiletine (MEXITIL) 150 MG capsule Take 1 capsule (150 mg total) by mouth 2 (two) times daily. 60 capsule 6   midodrine (PROAMATINE) 5 MG tablet Take 1 tablet (5 mg total) by mouth 3 (three) times daily with meals. 90 tablet 1   oxyCODONE (OXY IR/ROXICODONE) 5 MG immediate release tablet Take 1 tablet (5 mg total) by mouth every 12 (twelve) hours as needed for breakthrough pain. 30 tablet 0   pantoprazole (PROTONIX) 40 MG tablet Take 1 tablet by mouth once daily (Patient taking differently: 40 mg every morning.) 90 tablet 3   Probiotic Product (PROBIOTIC PO) Take 1 capsule by mouth in the morning.     rosuvastatin (CRESTOR) 20 MG tablet Take 1 tablet (20 mg total) by mouth daily. (Patient taking differently: Take 20 mg by mouth every morning.) 90 tablet 3   sotalol (BETAPACE) 80 MG tablet Take  1 tablet (80 mg total) by mouth daily. 30 tablet 1   SYNTHROID 175 MCG tablet 1 tablet daily every morning except half tablet on Sundays (Patient taking differently: Take 87.5-175 mcg by mouth See admin instructions. Take one tablet (175 mcg) by mouth before breakfast Monday thru Saturday, take 1/2 tablet (87.5 mcg) on Sundays before breakfast) 30 tablet 3   traMADol (ULTRAM) 50 MG tablet Take 1-2 tablets (50-100 mg total) by mouth every 6 (six) hours as needed for moderate pain. 40 tablet 5   traZODone (DESYREL) 50 MG tablet Take 1 tablet (50 mg total) by mouth at bedtime. 30 tablet 5   warfarin (COUMADIN) 5 MG tablet Take 7.5 mg 4/14 then 5 mg (1 tablet) every day as directed by HF Clinic (Patient taking differently: Take 5 mg by mouth at bedtime. Or as directed by HF Clinic) 135 tablet 11   No current facility-administered medications for this visit.   Facility-Administered Medications Ordered in Other Visits  Medication Dose Route Frequency Provider Last Rate Last Admin   etomidate (AMIDATE) injection    Anesthesia Intra-op Myna Bright, CRNA    20 mg at 02/08/14 0915   rocuronium [facility]) injection    Anesthesia Intra-op Myna Bright, CRNA   50 mg at 02/08/14 0932   succinylcholine (ANECTINE) injection    Anesthesia Intra-op Myna Bright, CRNA   100 mg at 02/08/14 0915    Allergies:   Ace inhibitors, Amiodarone, Diovan [valsartan], Lipitor [atorvastatin calcium], Jardiance [empagliflozin], and Norvasc [amlodipine]   Social History: Social History   Socioeconomic History   Marital status: Married    Spouse name: Not on file   Number of children: 1   Years of education: Not on file   Highest education level: Not on file  Occupational History   Occupation: Music therapist: RETIRED   Occupation: Copywriter, advertising  Tobacco Use   Smoking status: Never   Smokeless tobacco: Never  Vaping Use   Vaping Use: Never used  Substance and Sexual Activity   Alcohol use: No   Drug use: No    Types: Marijuana    Comment: "back in our younger days we smoked a little pot"   Sexual activity: Not Currently  Other Topics Concern   Not on file  Social History Narrative   Not on file   Social Determinants of Health   Financial Resource Strain: Not on file  Food Insecurity: Not on file  Transportation Needs: Not on file  Physical Activity: Not on file  Stress: Not on file  Social Connections: Not on file  Intimate Partner Violence: Not on file    Family History: Family History  Problem Relation Age of Onset   Heart failure Father    Stroke Father    Coronary artery disease Mother    Heart disease Mother    Hyperlipidemia Sister     Review of Systems: All other systems reviewed and are otherwise negative except as noted above.   Physical Exam: There were no vitals filed for this visit.   GEN- The patient is well appearing, alert and oriented x 3 today.   HEENT: normocephalic, atraumatic; sclera clear, conjunctiva pink; hearing intact; oropharynx clear; neck supple, no JVP Lymph- no cervical  lymphadenopathy Lungs- Clear to ausculation bilaterally, normal work of breathing.  No wheezes, rales, rhonchi Heart- Regular rate and rhythm, no murmurs, rubs or gallops, PMI not laterally displaced GI- soft, non-tender, non-distended, bowel sounds present, no hepatosplenomegaly Extremities- no  clubbing or cyanosis. No edema; DP/PT/radial pulses 2+ bilaterally MS- no significant deformity or atrophy Skin- warm and dry, no rash or lesion; ICD pocket well healed Psych- euthymic mood, full affect Neuro- strength and sensation are intact  ICD interrogation- reviewed in detail today,  See PACEART report  EKG:  EKG {ACTION; IS/IS AUQ:[id number]} ordered today. Personal review of EKG ordered {Blank single:19197::"today","***"} shows ***  Recent Labs: 11/24/2021: TSH 4.189 12/28/2021: ALT 12 01/02/2022: BUN 45; Creatinine, Ser 2.51; Hemoglobin 8.9; Magnesium 2.0; Platelets 210; Potassium 4.3; Sodium 135   Wt Readings from Last 3 Encounters:  01/02/22 147 lb 7.8 oz (66.9 kg)  12/19/21 157 lb 10.1 oz (71.5 kg)  12/16/21 160 lb 6.4 oz (72.8 kg)     Other studies Reviewed: Additional studies/ records that were reviewed today include: Previous EP office notes.   Assessment and Plan:  1.  Chronic systolic dysfunction s/p Medtronic {Blank single:19197::"***","single chamber ICD","dual chamber ICD","CRT-D","S-ICD"}  euvolemic today Stable on an appropriate medical regimen Normal ICD function See Pace Art report No changes today LVAD HM2 managed by CHF team He uses lasix as needed only.   2. VT  Recent breakthrough as sotalol was stopped.  Continue mexiletine 150 mg BID.  He was stable back on for reloading and has not had further.   3. Atrial flutter NSR by device device.    4. Frequent PVCs Poor options for AAD having failed amio Continue sotalol and mexiletine as above.  VP now > 90%. Difficult situation.   Current medicines are reviewed at length with the patient today.    Labs/  tests ordered today include: *** No orders of the defined types were placed in this encounter.   Disposition:   Follow up with {Blank single:19197::"Dr. Allred","Dr. Arlan Organ. Klein","Dr. Camnitz","Dr. Lambert","EP APP"} in {Blank single:19197::"2 weeks","4 weeks","3 months","6 months","12 months","as usual post gen change"}    Signed, Annamaria Helling  01/06/2022 9:11 AM  [facility] [address] [place] [address] [phone] (office) [phone] (fax)

## 2022-01-07 ENCOUNTER — Encounter (HOSPITAL_COMMUNITY): Payer: Medicare Other

## 2022-01-07 DIAGNOSIS — D631 Anemia in chronic kidney disease: Secondary | ICD-10-CM | POA: Diagnosis not present

## 2022-01-07 DIAGNOSIS — N184 Chronic kidney disease, stage 4 (severe): Secondary | ICD-10-CM | POA: Diagnosis not present

## 2022-01-07 DIAGNOSIS — Z471 Aftercare following joint replacement surgery: Secondary | ICD-10-CM | POA: Diagnosis not present

## 2022-01-07 DIAGNOSIS — I5022 Chronic systolic (congestive) heart failure: Secondary | ICD-10-CM | POA: Diagnosis not present

## 2022-01-07 DIAGNOSIS — I951 Orthostatic hypotension: Secondary | ICD-10-CM | POA: Diagnosis not present

## 2022-01-07 DIAGNOSIS — I130 Hypertensive heart and chronic kidney disease with heart failure and stage 1 through stage 4 chronic kidney disease, or unspecified chronic kidney disease: Secondary | ICD-10-CM | POA: Diagnosis not present

## 2022-01-08 ENCOUNTER — Ambulatory Visit (HOSPITAL_COMMUNITY): Payer: Self-pay | Admitting: Pharmacist

## 2022-01-08 ENCOUNTER — Ambulatory Visit (HOSPITAL_COMMUNITY)
Admit: 2022-01-08 | Discharge: 2022-01-08 | Disposition: A | Discharge status: Home / Self Care | Payer: Medicare Other | Attending: Internal Medicine | Admitting: Internal Medicine

## 2022-01-08 VITALS — BP 118/0 | HR 82 | Ht 69.0 in | Wt 153.4 lb

## 2022-01-08 DIAGNOSIS — I2510 Atherosclerotic heart disease of native coronary artery without angina pectoris: Secondary | ICD-10-CM

## 2022-01-08 DIAGNOSIS — Z95811 Presence of heart assist device: Secondary | ICD-10-CM | POA: Insufficient documentation

## 2022-01-08 DIAGNOSIS — Z7901 Long term (current) use of anticoagulants: Secondary | ICD-10-CM | POA: Insufficient documentation

## 2022-01-08 DIAGNOSIS — I472 Ventricular tachycardia, unspecified: Secondary | ICD-10-CM

## 2022-01-08 DIAGNOSIS — N184 Chronic kidney disease, stage 4 (severe): Secondary | ICD-10-CM | POA: Diagnosis not present

## 2022-01-08 DIAGNOSIS — I5022 Chronic systolic (congestive) heart failure: Secondary | ICD-10-CM | POA: Diagnosis not present

## 2022-01-08 DIAGNOSIS — I493 Ventricular premature depolarization: Secondary | ICD-10-CM | POA: Diagnosis not present

## 2022-01-08 DIAGNOSIS — R630 Anorexia: Secondary | ICD-10-CM

## 2022-01-08 DIAGNOSIS — I951 Orthostatic hypotension: Secondary | ICD-10-CM | POA: Diagnosis not present

## 2022-01-08 DIAGNOSIS — D631 Anemia in chronic kidney disease: Secondary | ICD-10-CM | POA: Diagnosis not present

## 2022-01-08 DIAGNOSIS — Z471 Aftercare following joint replacement surgery: Secondary | ICD-10-CM | POA: Diagnosis not present

## 2022-01-08 DIAGNOSIS — I130 Hypertensive heart and chronic kidney disease with heart failure and stage 1 through stage 4 chronic kidney disease, or unspecified chronic kidney disease: Secondary | ICD-10-CM | POA: Diagnosis not present

## 2022-01-08 LAB — PROTIME-INR
INR: 2 — ABNORMAL HIGH (ref 0.8–1.2)
Prothrombin Time: 22.6 seconds — ABNORMAL HIGH (ref 11.4–15.2)

## 2022-01-08 LAB — IRON AND TIBC
Iron: 72 ug/dL (ref 45–182)
Saturation Ratios: 25 % (ref 17.9–39.5)
TIBC: 284 ug/dL (ref 250–450)
UIBC: 212 ug/dL

## 2022-01-08 LAB — CBC
HCT: 28.8 % — ABNORMAL LOW (ref 39.0–52.0)
Hemoglobin: 9.3 g/dL — ABNORMAL LOW (ref 13.0–17.0)
MCH: 29.7 pg (ref 26.0–34.0)
MCHC: 32.3 g/dL (ref 30.0–36.0)
MCV: 92 fL (ref 80.0–100.0)
Platelets: 235 10*3/uL (ref 150–400)
RBC: 3.13 MIL/uL — ABNORMAL LOW (ref 4.22–5.81)
RDW: 15.7 % — ABNORMAL HIGH (ref 11.5–15.5)
WBC: 6.2 10*3/uL (ref 4.0–10.5)
nRBC: 0 % (ref 0.0–0.2)

## 2022-01-08 LAB — BASIC METABOLIC PANEL
Anion gap: 7 (ref 5–15)
BUN: 37 mg/dL — ABNORMAL HIGH (ref 8–23)
CO2: 26 mmol/L (ref 22–32)
Calcium: 9.3 mg/dL (ref 8.9–10.3)
Chloride: 104 mmol/L (ref 98–111)
Creatinine, Ser: 2.17 mg/dL — ABNORMAL HIGH (ref 0.61–1.24)
GFR, Estimated: 30 mL/min — ABNORMAL LOW (ref >60)
Glucose, Bld: 94 mg/dL (ref 70–99)
Potassium: 3.8 mmol/L (ref 3.5–5.1)
Sodium: 137 mmol/L (ref 135–145)

## 2022-01-08 LAB — MAGNESIUM: Magnesium: 2.2 mg/dL (ref 1.7–2.4)

## 2022-01-08 LAB — VITAMIN B12: Vitamin B-12: 275 pg/mL (ref 180–914)

## 2022-01-08 LAB — LACTATE DEHYDROGENASE: LDH: 306 U/L — ABNORMAL HIGH (ref 98–192)

## 2022-01-08 LAB — FOLATE: Folate: 22.3 ng/mL (ref >5.9)

## 2022-01-08 LAB — FERRITIN: Ferritin: 404 ng/mL — ABNORMAL HIGH (ref 24–336)

## 2022-01-08 NOTE — Progress Notes (Signed)
Patient presents for hosp f/u following his most recent admission for VT requiring urgent cardioversion in the ER in [facility] today with [name]. Reports no problems with VAD equipment or concerns with drive line.  ? ?Pt arrived in a WC. Pt was a 1 person assist to the scale and stretcher. Patient reports that he has a poor appetite and "can't seem to get my strength back." Weight is down 7 lbs from his last clinic visit. ? ?Pt is currently having home PT twice a week.  ? ?EKG done, medtronic device interrogated - Dr Haroldine Laws reviewed both. ? ?Anemia panel drawn in clinic today - normal. ? ?Vital Signs:   ?Doppler Pressure: 118 ?Automatc BP: 122/98 (107)  ?HR: 82 ?SPO2: 99% on RA ?  ?Weight: 153.4 lb w/ eqt ?Last weight: 160.4 lb w/eqt ? ?VAD Indication: ?Destination therapy- patient choice   ?  ?VAD interrogation & Equipment Management: ?Speed: 8588 ?Flow: 3.5 ?Power: 5.2 w    ?PI: 6.5 ?  ?Alarms: none ?Events: 20 PI events daily ? ?Fixed speed 9600  ?Low speed limit: 9000 ?  ?Primary Controller:  Replace back up battery in 29 months ?Back up controller:  Replace back up battery in 26 months ?  ?Annual Equipment Maintenance on UBC/PM was performed 09/23/21 ?  ?I reviewed the LVAD parameters from today and compared the results to the patient's prior recorded data. LVAD interrogation was NEGATIVE for significant power changes, NEGATIVE for clinical alarms and STABLE for PI events/speed drops. No programming changes were made and pump is functioning within specified parameters. Pt is performing daily controller and system monitor self tests along with completing weekly and monthly maintenance for LVAD equipment. ? ?Exit Site Care: ?Drive line is being maintained weekly by [name], his wife. Existing VAD dressing C/D/I Sorbaview dressing and Silverlon patch intact. Exit site healed and incorporated, the velour is fully implanted at exit site. Pt denies fever or chills. Pt has adequate supplies at home.   ? ?Significant Events on VAD Support:  ?10/2013>SBO ?12/2016> elevated LDH, admit for [place] ?  ?Device: Dual Medtronic ?Therapies:  on 231  ?Pacing: AAI<=>DDD 80 bpm  ?Last check:  06/26/21 ? ?BP & Labs:  ?Doppler 118 - Doppler is reflecting modified systolic  ? ?Hgb 9.3 - No S/S of bleeding. Specifically denies melena/BRBPR or nosebleeds. ?  ?LDH 306 - established baseline of 200- 350. Denies tea-colored urine. No power elevations noted on interrogation.  ? ? ?Patient Instructions:  ?Please add CBD gummies to stimulate appetite. ?Coumadin dosing per Ander Purpura PharmD- ?Return to [place] clinic in 1 week for f/u with Dr Haroldine Laws ? ?Tanda Rockers RN ?VAD Coordinator  ?Office: [phone]  ?24/7 Pager: [phone]  ? ? ? ? ? ? ?

## 2022-01-08 NOTE — Progress Notes (Signed)
LVAD INR 

## 2022-01-08 NOTE — Patient Instructions (Signed)
Please add CBD gummies to stimulate appetite. ?Coumadin dosing per Ander Purpura PharmD- ?Return to [place] clinic in 1 week for f/u with Dr Haroldine Laws ?

## 2022-01-09 ENCOUNTER — Encounter: Payer: Medicare Other | Admitting: Student

## 2022-01-09 DIAGNOSIS — I2510 Atherosclerotic heart disease of native coronary artery without angina pectoris: Secondary | ICD-10-CM

## 2022-01-09 DIAGNOSIS — Z95811 Presence of heart assist device: Secondary | ICD-10-CM

## 2022-01-09 DIAGNOSIS — I472 Ventricular tachycardia, unspecified: Secondary | ICD-10-CM

## 2022-01-09 NOTE — Progress Notes (Signed)
Remote ICD transmission.   

## 2022-01-11 NOTE — Progress Notes (Signed)
? ?VAD CLINIC NOTE ? ? ?HPI:  ?Johnny Oneill is a 81 y.o. 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  ? ?Presented to ER on 06/27/19 with syncopal episode and head injury from fall. ECG showed VT @ 240. Underwent emergent DC-CV in ER. Head CT no bleed. Admitted and had ICD gen replacement (EOL). -> Did not tolerate amio due to tremors. ? ?Had ICD shock for VT on 06/09/20. Started on sotalol ? ?On 02/06/21 Paced out of AFL. ? ?Seen in ER 05/2021 due to LOW FLOW alarms. Found to be in VT rate 170. DCCV x 1 - returned to [place] with frequent PVCs. Pacer settings adjusted by [name] PA- DDD 90. ? ?[place] 11/22 to assess for RV failure ?RA = 5 ?RV = 33/5 ?PA = 33/13 (20) ?PCW = 10 ?Fick cardiac output/index = 4.0/2.1 ?Thermo CO/CI = 4.9/2.6 ?PVR = 2.0 WU ?Ao sat = 95% ?PA sat = 54%, 59% ?PAPI = 4.0 ? ?Admitted 12/01/21 for R TKA. Did well with surgery but complicated by severe orthostatic hypotension. Midodrine added   ? ?Admitted 12/18/21 with syncope. No events on device. Hydrated. Midodrine increased to 15 tid. Sotalol held ? ?Admitted 12/28/21 with recurrent VT. Had DC-CV in ER. Sotalol restarted. Continued to have elevated MAPs as well as orthostasis. Midodrine decreased and florinef added.  ? ?Here for f/u. Continues to feel weak. Poor appetite. Weight down 7 pounds. Doing HHPT 2x/week. Denies dizziness. No ICD shocks since discharge. Denies orthopnea or PND. No fevers, chills or problems with driveline. No bleeding, melena or neuro symptoms. No VAD alarms. Taking all meds as prescribed.  ? ?ECG: Sinus with v pacing 80. Personally reviewed ?  ?VAD Indication: ?Destination therapy- patient choice   ?  ?VAD interrogation & Equipment Management: ?Speed: 8099 ?Flow: 3.5 ?Power: 5.2 w    ?PI: 6.5 ?  ?Alarms: none ?Events: 20 PI events daily ? ?Fixed speed 9600  ?Low speed limit: 9000 ?  ?Primary Controller:  Replace back up battery in 29  months ?Back up controller:  Replace back up battery in 26 months ?  ?Annual Equipment Maintenance on UBC/PM was performed 09/23/21 ?  ? ?Past Medical History:  ?Diagnosis Date  ? Anxiety   ? Automatic implantable cardioverter-defibrillator in situ   ? CAD (coronary artery disease)   ? S/P stenting of LCX in 2004;  s/p Lat MI 2009 with occlusion of the LCX - treated with Promus stenting. Has total occlusion of the RCA.   ? CHF (congestive heart failure) ([place])   ? Hypercholesteremia   ? Hypertension   ? Hypothyroidism   ? ICD (implantable cardiac defibrillator) in place   ? PROPHYLACTIC      medtronic  ? Inferior MI ([place]) "? date"; 2009  ? Ischemic cardiomyopathy   ? a. 10/09 Echo: Sev LV Dysfxn, inf/lat AK, Mod MR.  ? Liver disease   ? LVAD (left ventricular assist device) present ([place])   ? Osteoarthritis   ? a. s/p R TKR  ? Pacemaker   ? PVC (premature ventricular contraction)   ? RBBB   ? Shortness of breath   ? ? ?Current Outpatient Medications  ?Medication Sig Dispense Refill  ? acetaminophen (TYLENOL) 500 MG tablet Take 1,000 mg by mouth every 6 (six) hours as needed (pain).    ? aspirin EC 81 MG tablet Take 81 mg by mouth in the morning.    ?  Cholecalciferol (VITAMIN D) 125 MCG (5000 UT) CAPS Take 5,000 Units by mouth in the morning.    ? citalopram (CELEXA) 10 MG tablet Take 1 tablet by mouth once daily (Patient taking differently: Take 10 mg by mouth every morning.) 90 tablet 3  ? Coenzyme Q10 (COQ10) 200 MG CAPS Take 200 mg by mouth in the morning.    ? Famotidine (PEPCID PO) Take 1 tablet by mouth every morning.    ? fludrocortisone (FLORINEF) 0.1 MG tablet Take 1 tablet (0.1 mg total) by mouth daily. 30 tablet 1  ? melatonin 5 MG TABS Take 5 mg by mouth at bedtime.    ? mexiletine (MEXITIL) 150 MG capsule Take 1 capsule (150 mg total) by mouth 2 (two) times daily. 60 capsule 6  ? midodrine (PROAMATINE) 5 MG tablet Take 1 tablet (5 mg total) by mouth 3 (three) times daily with meals. 90 tablet 1  ?  pantoprazole (PROTONIX) 40 MG tablet Take 1 tablet by mouth once daily (Patient taking differently: 40 mg every morning.) 90 tablet 3  ? Probiotic Product (PROBIOTIC PO) Take 1 capsule by mouth in the morning.    ? rosuvastatin (CRESTOR) 20 MG tablet Take 1 tablet (20 mg total) by mouth daily. (Patient taking differently: Take 20 mg by mouth every morning.) 90 tablet 3  ? sotalol (BETAPACE) 80 MG tablet Take 1 tablet (80 mg total) by mouth daily. 30 tablet 1  ? SYNTHROID 175 MCG tablet 1 tablet daily every morning except half tablet on Sundays (Patient taking differently: Take 87.5-175 mcg by mouth See admin instructions. Take one tablet (175 mcg) by mouth before breakfast Monday thru Saturday, take 1/2 tablet (87.5 mcg) on Sundays before breakfast) 30 tablet 3  ? traMADol (ULTRAM) 50 MG tablet Take 1-2 tablets (50-100 mg total) by mouth every 6 (six) hours as needed for moderate pain. 40 tablet 5  ? traZODone (DESYREL) 50 MG tablet Take 1 tablet (50 mg total) by mouth at bedtime. 30 tablet 5  ? warfarin (COUMADIN) 5 MG tablet Take 7.5 mg 4/14 then 5 mg (1 tablet) every day as directed by HF Clinic (Patient taking differently: Take 5 mg by mouth at bedtime. Or as directed by HF Clinic) 135 tablet 11  ? furosemide (LASIX) 20 MG tablet Take 1 tablet (20 mg total) by mouth daily as needed (Take 20 mg as daily as needed for wt > 153 lbs). Reported on 01/14/2016 (Patient not taking: Reported on 11/26/2021) 30 tablet 6  ? oxyCODONE (OXY IR/ROXICODONE) 5 MG immediate release tablet Take 1 tablet (5 mg total) by mouth every 12 (twelve) hours as needed for breakthrough pain. (Patient not taking: Reported on 01/08/2022) 30 tablet 0  ? ?No current facility-administered medications for this encounter.  ? ?Facility-Administered Medications Ordered in Other Encounters  ?Medication Dose Route Frequency Provider Last Rate Last Admin  ? etomidate (AMIDATE) injection    Anesthesia Intra-op Myna Bright, CRNA   20 mg at 02/08/14  0915  ? rocuronium [facility]) injection    Anesthesia Intra-op Myna Bright, CRNA   50 mg at 02/08/14 0932  ? succinylcholine (ANECTINE) injection    Anesthesia Intra-op Myna Bright, CRNA   100 mg at 02/08/14 0915  ? ?Ace inhibitors, Amiodarone, Diovan [valsartan], Lipitor [atorvastatin calcium], Jardiance [empagliflozin], and Norvasc [amlodipine] ? ?Review of systems complete and found to be negative unless listed in HPI.   ? ?  ?Vital Signs:  ?Doppler Pressure: 84 ?Vitals:  ? 01/08/22 1122  01/08/22 1128  ?BP: (!) 122/98 (!) 118/0  ?Pulse: 82   ?SpO2: 99%   ? ? ?  ?Vital Signs:   ?Doppler Pressure: 118 ?Automatc BP: 122/98 (107)     ?HR: 82 ?SPO2: 99% on RA ?  ?Weight: 153.4 lb w/ eqt ?Last weight: 160.4 lb w/eqt ? ?Physical exam: ?General:  Weak appearing NAD.  ?HEENT: normal  ?Neck: supple. JVP not elevated.  Carotids 2+ bilat; no bruits. No lymphadenopathy or thryomegaly appreciated. ?Cor: LVAD hum.  ?Lungs: Clear. ?Abdomen: soft, nontender, non-distended. No hepatosplenomegaly. No bruits or masses. Good bowel sounds. Driveline site clean. Anchor in place.  ?Extremities: no cyanosis, clubbing, rash. Warm no edema  ?Neuro: alert & oriented x 3. No focal deficits. Moves all 4 without problem  ? ? ?ASSESSMENT AND PLAN:  ? ?1) Weakness/anorexia ?- no clear cause. struggling to bounce back from recent surgery ?- I wonder if RV pacing may be playing a role. He is following with EP but options limited here. Likely not ideal candidate for CRT upgrade at this time.  ?- Will try to improve appetite with CBD gummies ?- Continue HHPT ? ?2) Chronic systolic HF: ICM, EF 16-60% s/p LVAD HM II implant 08/2013 for DT.   ?- Echo 3/18 EF 10-15% mild AI. Mod RV dysfunction. LVAD cannula OK ?- has struggled with RV failure however RHC 11/22 looked good (despite evidence of RVF on LE dopplers showing reflux ?- Now NYHA III-IIIB ?- Volume status ok, if not a bit low ?- Remains off all diuretics including spiro ?- Failed  Jardiance due to volume depletion ?- He has not tolerated ACE-I or amlodipine or b-blocker in the past. ? ?3) LVAD: HMII 08/2013 for DT. ?- VAD interrogated personally. Parameters stable. ?- Admitted in May 2018 wi

## 2022-01-11 NOTE — Addendum Note (Signed)
Encounter addended by: Jolaine Artist, MD on: 01/11/2022 2:28 PM ? Actions taken: Charge Capture section accepted

## 2022-01-12 DIAGNOSIS — I5022 Chronic systolic (congestive) heart failure: Secondary | ICD-10-CM | POA: Diagnosis not present

## 2022-01-12 DIAGNOSIS — I130 Hypertensive heart and chronic kidney disease with heart failure and stage 1 through stage 4 chronic kidney disease, or unspecified chronic kidney disease: Secondary | ICD-10-CM | POA: Diagnosis not present

## 2022-01-12 DIAGNOSIS — I951 Orthostatic hypotension: Secondary | ICD-10-CM | POA: Diagnosis not present

## 2022-01-12 DIAGNOSIS — D631 Anemia in chronic kidney disease: Secondary | ICD-10-CM | POA: Diagnosis not present

## 2022-01-12 DIAGNOSIS — Z471 Aftercare following joint replacement surgery: Secondary | ICD-10-CM | POA: Diagnosis not present

## 2022-01-12 DIAGNOSIS — N184 Chronic kidney disease, stage 4 (severe): Secondary | ICD-10-CM | POA: Diagnosis not present

## 2022-01-13 ENCOUNTER — Other Ambulatory Visit (HOSPITAL_COMMUNITY): Payer: Self-pay | Admitting: *Deleted

## 2022-01-13 DIAGNOSIS — Z7901 Long term (current) use of anticoagulants: Secondary | ICD-10-CM

## 2022-01-13 DIAGNOSIS — Z95811 Presence of heart assist device: Secondary | ICD-10-CM

## 2022-01-14 ENCOUNTER — Ambulatory Visit (HOSPITAL_COMMUNITY)
Admission: RE | Admit: 2022-01-14 | Discharge: 2022-01-14 | Disposition: A | Discharge status: Home / Self Care | Payer: Medicare Other | Source: Ambulatory Visit | Attending: Cardiology | Admitting: Cardiology

## 2022-01-14 ENCOUNTER — Ambulatory Visit (HOSPITAL_COMMUNITY): Payer: Self-pay | Admitting: Pharmacist

## 2022-01-14 VITALS — BP 112/0 | HR 83 | Ht 69.0 in | Wt 155.2 lb

## 2022-01-14 DIAGNOSIS — Z95810 Presence of automatic (implantable) cardiac defibrillator: Secondary | ICD-10-CM | POA: Diagnosis not present

## 2022-01-14 DIAGNOSIS — I255 Ischemic cardiomyopathy: Secondary | ICD-10-CM | POA: Insufficient documentation

## 2022-01-14 DIAGNOSIS — R531 Weakness: Secondary | ICD-10-CM | POA: Insufficient documentation

## 2022-01-14 DIAGNOSIS — Z7901 Long term (current) use of anticoagulants: Secondary | ICD-10-CM | POA: Diagnosis not present

## 2022-01-14 DIAGNOSIS — Z95811 Presence of heart assist device: Secondary | ICD-10-CM

## 2022-01-14 DIAGNOSIS — R7402 Elevation of levels of lactic acid dehydrogenase (LDH): Secondary | ICD-10-CM | POA: Diagnosis not present

## 2022-01-14 DIAGNOSIS — Z7982 Long term (current) use of aspirin: Secondary | ICD-10-CM | POA: Insufficient documentation

## 2022-01-14 DIAGNOSIS — I5022 Chronic systolic (congestive) heart failure: Secondary | ICD-10-CM | POA: Diagnosis not present

## 2022-01-14 DIAGNOSIS — I5082 Biventricular heart failure: Secondary | ICD-10-CM | POA: Diagnosis not present

## 2022-01-14 DIAGNOSIS — N184 Chronic kidney disease, stage 4 (severe): Secondary | ICD-10-CM | POA: Diagnosis not present

## 2022-01-14 DIAGNOSIS — R630 Anorexia: Secondary | ICD-10-CM | POA: Insufficient documentation

## 2022-01-14 DIAGNOSIS — E039 Hypothyroidism, unspecified: Secondary | ICD-10-CM | POA: Insufficient documentation

## 2022-01-14 DIAGNOSIS — I951 Orthostatic hypotension: Secondary | ICD-10-CM | POA: Insufficient documentation

## 2022-01-14 DIAGNOSIS — I2510 Atherosclerotic heart disease of native coronary artery without angina pectoris: Secondary | ICD-10-CM | POA: Insufficient documentation

## 2022-01-14 DIAGNOSIS — Z79899 Other long term (current) drug therapy: Secondary | ICD-10-CM | POA: Insufficient documentation

## 2022-01-14 DIAGNOSIS — R251 Tremor, unspecified: Secondary | ICD-10-CM | POA: Diagnosis not present

## 2022-01-14 DIAGNOSIS — I493 Ventricular premature depolarization: Secondary | ICD-10-CM | POA: Diagnosis not present

## 2022-01-14 DIAGNOSIS — I130 Hypertensive heart and chronic kidney disease with heart failure and stage 1 through stage 4 chronic kidney disease, or unspecified chronic kidney disease: Secondary | ICD-10-CM | POA: Diagnosis not present

## 2022-01-14 DIAGNOSIS — I4892 Unspecified atrial flutter: Secondary | ICD-10-CM | POA: Insufficient documentation

## 2022-01-14 LAB — BASIC METABOLIC PANEL
Anion gap: 7 (ref 5–15)
BUN: 29 mg/dL — ABNORMAL HIGH (ref 8–23)
CO2: 25 mmol/L (ref 22–32)
Calcium: 9.1 mg/dL (ref 8.9–10.3)
Chloride: 106 mmol/L (ref 98–111)
Creatinine, Ser: 2.27 mg/dL — ABNORMAL HIGH (ref 0.61–1.24)
GFR, Estimated: 29 mL/min — ABNORMAL LOW (ref >60)
Glucose, Bld: 84 mg/dL (ref 70–99)
Potassium: 4.3 mmol/L (ref 3.5–5.1)
Sodium: 138 mmol/L (ref 135–145)

## 2022-01-14 LAB — CBC
HCT: 29.9 % — ABNORMAL LOW (ref 39.0–52.0)
Hemoglobin: 9.3 g/dL — ABNORMAL LOW (ref 13.0–17.0)
MCH: 29 pg (ref 26.0–34.0)
MCHC: 31.1 g/dL (ref 30.0–36.0)
MCV: 93.1 fL (ref 80.0–100.0)
Platelets: 235 10*3/uL (ref 150–400)
RBC: 3.21 MIL/uL — ABNORMAL LOW (ref 4.22–5.81)
RDW: 15.9 % — ABNORMAL HIGH (ref 11.5–15.5)
WBC: 4.7 10*3/uL (ref 4.0–10.5)
nRBC: 0 % (ref 0.0–0.2)

## 2022-01-14 LAB — PROTIME-INR
INR: 2.4 — ABNORMAL HIGH (ref 0.8–1.2)
Prothrombin Time: 26 seconds — ABNORMAL HIGH (ref 11.4–15.2)

## 2022-01-14 LAB — LACTATE DEHYDROGENASE: LDH: 287 U/L — ABNORMAL HIGH (ref 98–192)

## 2022-01-14 NOTE — Progress Notes (Signed)
Patient presents for 1 week f/u in [facility] today with [name]. Reports no problems with VAD equipment or concerns with drive line.  ? ?Pt arrived to clinic using a walker today. Pt tells me that he is feeling much better. He started the CBD gummies last week and this has increased his appetite. Pt has gained 2 lbs since his visit last week. ? ?Pt is currently having home PT twice a week. Pt tells me that his last visit is Friday. ? ?Vital Signs:   ?Doppler Pressure: 112 ?Automatc BP: 129/101 (112)  ?HR: 83 ?SPO2: 97% on RA ?  ?Weight: 155.2 lb w/ eqt ?Last weight: 153.4 lb w/eqt ? ?VAD Indication: ?Destination therapy- patient choice   ?  ?VAD interrogation & Equipment Management: ?Speed: 3903 ?Flow: 4.9 ?Power: 5.9 w    ?PI: 6 ?  ?Alarms: none ?Events: 10-20 PI events daily ? ?Fixed speed 9600  ?Low speed limit: 9000 ?  ?Primary Controller:  Replace back up battery in 29 months ?Back up controller:  Replace back up battery in 26 months ?  ?Annual Equipment Maintenance on UBC/PM was performed 09/23/21 ?  ?I reviewed the LVAD parameters from today and compared the results to the patient's prior recorded data. LVAD interrogation was NEGATIVE for significant power changes, NEGATIVE for clinical alarms and STABLE for PI events/speed drops. No programming changes were made and pump is functioning within specified parameters. Pt is performing daily controller and system monitor self tests along with completing weekly and monthly maintenance for LVAD equipment. ? ?Exit Site Care: ?Drive line is being maintained weekly by [name], his wife. Existing VAD dressing C/D/I S[place]aview dressing and Silverlon patch intact. Exit site healed and incorporated, the velour is fully implanted at exit site. Pt denies fever or chills. Pt has adequate supplies at home.  ? ?Significant Events on VAD Support:  ?10/2013>SBO ?12/2016> elevated LDH, admit for [place] ?  ?Device: Dual Medtronic ?Therapies:  on 231  ?Pacing: AAI<=>DDD 80 bpm   ?Last check:  06/26/21 ? ?BP & Labs:  ?Doppler 112 - Doppler is reflecting MAP ? ?Hgb 9.3 - No S/S of bleeding. Specifically denies melena/BRBPR or nosebleeds. ?  ?LDH 287 - established baseline of 200- 350. Denies tea-colored urine. No power elevations noted on interrogation.  ? ? ?Patient Instructions:  ?No change in medications ?Coumadin dosing per Ander Purpura PharmD ?Return to [place] clinic in 3 weeks for f/u with Dr Haroldine Laws ? ?Tanda Rockers RN ?VAD Coordinator  ?Office: [phone]  ?24/7 Pager: [phone]  ? ? ? ? ? ? ?

## 2022-01-14 NOTE — Progress Notes (Signed)
? ?VAD CLINIC NOTE ? ? ?HPI:  ?Johnny BLOUNT is a 81 y.o. 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  ? ?Presented to ER on 06/27/19 with syncopal episode and head injury from fall. ECG showed VT @ 240. Underwent emergent DC-CV in ER. Head CT no bleed. Admitted and had ICD gen replacement (EOL). -> Did not tolerate amio due to tremors. ? ?Had ICD shock for VT on 06/09/20. Started on sotalol ? ?On 02/06/21 Paced out of AFL. ? ?Seen in ER 05/2021 due to LOW FLOW alarms. Found to be in VT rate 170. DCCV x 1 - returned to [place] with frequent PVCs. Pacer settings adjusted by [name] PA- DDD 90. ? ?[place] 11/22 to assess for RV failure ?RA = 5 ?RV = 33/5 ?PA = 33/13 (20) ?PCW = 10 ?Fick cardiac output/index = 4.0/2.1 ?Thermo CO/CI = 4.9/2.6 ?PVR = 2.0 WU ?Ao sat = 95% ?PA sat = 54%, 59% ?PAPI = 4.0 ? ?Admitted 12/01/21 for R TKA. Did well with surgery but complicated by severe orthostatic hypotension. Midodrine added   ? ?Admitted 12/18/21 with syncope. No events on device. Hydrated. Midodrine increased to 15 tid. Sotalol held ? ?Admitted 12/28/21 with recurrent VT. Had DC-CV in ER. Sotalol restarted. Continued to have elevated MAPs as well as orthostasis. Midodrine decreased and florinef added.  ? ?Here for 1 week f/u. At last visit we started CBD gummies due to weakness and poor appetite. Feel much better today. Mood and appetite improved. Has gained 2 pounds. No ICD shocks. No orthostasis. Denies orthopnea or PND. No fevers, chills or problems with driveline. No bleeding, melena or neuro symptoms. No VAD alarms. Taking all meds as prescribed.  ? ?  ?VAD Indication: ?Destination therapy- patient choice   ?  ?VAD interrogation & Equipment Management: ?Speed: 3810 ?Flow: 4.9 ?Power: 5.9 w    ?PI: 6 ?  ?Alarms: none ?Events: 10-20 PI events daily ? ?Fixed speed 9600  ?Low speed limit: 9000 ?  ?Primary Controller:  Replace back up battery in  29 months ?Back up controller:  Replace back up battery in 26 months ?  ?Annual Equipment Maintenance on UBC/PM was performed 09/23/21 ?  ?I reviewed the LVAD parameters from today and compared the results to the patient's prior recorded data. LVAD interrogation was NEGATIVE for significant power changes, NEGATIVE for clinical alarms and STABLE for PI events/speed drops. No programming changes were made and pump is functioning within specified parameters. Pt is performing daily controller and system monitor self tests along with completing weekly and monthly maintenance for LVAD equipment. ?  ? ?Past Medical History:  ?Diagnosis Date  ? Anxiety   ? Automatic implantable cardioverter-defibrillator in situ   ? CAD (coronary artery disease)   ? S/P stenting of LCX in 2004;  s/p Lat MI 2009 with occlusion of the LCX - treated with Promus stenting. Has total occlusion of the RCA.   ? CHF (congestive heart failure) ([place])   ? Hypercholesteremia   ? Hypertension   ? Hypothyroidism   ? ICD (implantable cardiac defibrillator) in place   ? PROPHYLACTIC      medtronic  ? Inferior MI ([place]) "? date"; 2009  ? Ischemic cardiomyopathy   ? a. 10/09 Echo: Sev LV Dysfxn, inf/lat AK, Mod MR.  ? Liver disease   ? LVAD (left ventricular assist device) present ([place])   ? Osteoarthritis   ?  a. s/p R TKR  ? Pacemaker   ? PVC (premature ventricular contraction)   ? RBBB   ? Shortness of breath   ? ? ?Current Outpatient Medications  ?Medication Sig Dispense Refill  ? acetaminophen (TYLENOL) 500 MG tablet Take 1,000 mg by mouth every 6 (six) hours as needed (pain).    ? aspirin EC 81 MG tablet Take 81 mg by mouth in the morning.    ? Cholecalciferol (VITAMIN D) 125 MCG (5000 UT) CAPS Take 5,000 Units by mouth in the morning.    ? citalopram (CELEXA) 10 MG tablet Take 1 tablet by mouth once daily (Patient taking differently: Take 10 mg by mouth every morning.) 90 tablet 3  ? Coenzyme Q10 (COQ10) 200 MG CAPS Take 200 mg by mouth in the morning.     ? Famotidine (PEPCID PO) Take 1 tablet by mouth every morning.    ? fludrocortisone (FLORINEF) 0.1 MG tablet Take 1 tablet (0.1 mg total) by mouth daily. 30 tablet 1  ? melatonin 5 MG TABS Take 5 mg by mouth at bedtime.    ? mexiletine (MEXITIL) 150 MG capsule Take 1 capsule (150 mg total) by mouth 2 (two) times daily. 60 capsule 6  ? midodrine (PROAMATINE) 5 MG tablet Take 1 tablet (5 mg total) by mouth 3 (three) times daily with meals. 90 tablet 1  ? pantoprazole (PROTONIX) 40 MG tablet Take 1 tablet by mouth once daily (Patient taking differently: 40 mg every morning.) 90 tablet 3  ? Probiotic Product (PROBIOTIC PO) Take 1 capsule by mouth in the morning.    ? rosuvastatin (CRESTOR) 20 MG tablet Take 1 tablet (20 mg total) by mouth daily. (Patient taking differently: Take 20 mg by mouth every morning.) 90 tablet 3  ? sotalol (BETAPACE) 80 MG tablet Take 1 tablet (80 mg total) by mouth daily. 30 tablet 1  ? SYNTHROID 175 MCG tablet 1 tablet daily every morning except half tablet on Sundays (Patient taking differently: Take 87.5-175 mcg by mouth See admin instructions. Take one tablet (175 mcg) by mouth before breakfast Monday thru Saturday, take 1/2 tablet (87.5 mcg) on Sundays before breakfast) 30 tablet 3  ? traMADol (ULTRAM) 50 MG tablet Take 1-2 tablets (50-100 mg total) by mouth every 6 (six) hours as needed for moderate pain. 40 tablet 5  ? traZODone (DESYREL) 50 MG tablet Take 1 tablet (50 mg total) by mouth at bedtime. 30 tablet 5  ? warfarin (COUMADIN) 5 MG tablet Take 7.5 mg 4/14 then 5 mg (1 tablet) every day as directed by HF Clinic (Patient taking differently: Take 5 mg by mouth at bedtime. Or as directed by HF Clinic) 135 tablet 11  ? furosemide (LASIX) 20 MG tablet Take 1 tablet (20 mg total) by mouth daily as needed (Take 20 mg as daily as needed for wt > 153 lbs). Reported on 01/14/2016 (Patient not taking: Reported on 11/26/2021) 30 tablet 6  ? oxyCODONE (OXY IR/ROXICODONE) 5 MG immediate release  tablet Take 1 tablet (5 mg total) by mouth every 12 (twelve) hours as needed for breakthrough pain. (Patient not taking: Reported on 01/08/2022) 30 tablet 0  ? ?No current facility-administered medications for this encounter.  ? ?Facility-Administered Medications Ordered in Other Encounters  ?Medication Dose Route Frequency Provider Last Rate Last Admin  ? etomidate (AMIDATE) injection    Anesthesia Intra-op Myna Bright, CRNA   20 mg at 02/08/14 0915  ? rocuronium (ZEMURON) injection    Anesthesia Intra-op [place],  Arther Dames, CRNA   50 mg at 02/08/14 0932  ? succinylcholine (ANECTINE) injection    Anesthesia Intra-op Myna Bright, CRNA   100 mg at 02/08/14 0915  ? ?Ace inhibitors, Amiodarone, Diovan [valsartan], Lipitor [atorvastatin calcium], Jardiance [empagliflozin], and Norvasc [amlodipine] ? ?Review of systems complete and found to be negative unless listed in HPI.   ? ?  ?Vital Signs:  ?Doppler Pressure: 84 ?Vitals:  ? 01/14/22 1241 01/14/22 1242  ?BP: (!) 129/101 (!) 112/0  ?Pulse: 83   ?SpO2: 97%   ? ? ? ?Vital Signs:   ?Doppler Pressure: 112 ?Automatc BP: 129/101 (112)   ?HR: 83 ?SPO2: 97% on RA ?  ?Weight: 155.2 lb w/ eqt ?Last weight: 153.4 lb w/eqt ? ?Physical exam: ?General:  NAD.  ?HEENT: normal  ?Neck: supple. JVP not elevated.  Carotids 2+ bilat; no bruits. No lymphadenopathy or thryomegaly appreciated. ?Cor: LVAD hum.  ?Lungs: Clear. ?Abdomen:  soft, nontender, non-distended. No hepatosplenomegaly. No bruits or masses. Good bowel sounds. Driveline site clean. Anchor in place.  ?Extremities: no cyanosis, clubbing, rash. Warm no edema  ?Neuro: alert & oriented x 3. No focal deficits. Moves all 4 without problem  ? ? ? ?ASSESSMENT AND PLAN:  ? ?1) Weakness/anorexia ?- no clear cause. struggling to bounce back from recent surgery ?- now much improved with CBD gummies. Will continue ?- Previously, I wondered if RV pacing may be playing a role. He is following with EP but options limited here.  Likely not ideal candidate for CRT upgrade at this time.  ? ?2) Chronic systolic HF: ICM, EF 55-20% s/p LVAD HM II implant 08/2013 for DT.   ?- Echo 3/18 EF 10-15% mild AI. Mod RV dysfunction. LVAD ca

## 2022-01-14 NOTE — Progress Notes (Signed)
LVAD INR 

## 2022-01-15 DIAGNOSIS — D631 Anemia in chronic kidney disease: Secondary | ICD-10-CM | POA: Diagnosis not present

## 2022-01-15 DIAGNOSIS — I951 Orthostatic hypotension: Secondary | ICD-10-CM | POA: Diagnosis not present

## 2022-01-15 DIAGNOSIS — I130 Hypertensive heart and chronic kidney disease with heart failure and stage 1 through stage 4 chronic kidney disease, or unspecified chronic kidney disease: Secondary | ICD-10-CM | POA: Diagnosis not present

## 2022-01-15 DIAGNOSIS — I5022 Chronic systolic (congestive) heart failure: Secondary | ICD-10-CM | POA: Diagnosis not present

## 2022-01-15 DIAGNOSIS — Z471 Aftercare following joint replacement surgery: Secondary | ICD-10-CM | POA: Diagnosis not present

## 2022-01-15 DIAGNOSIS — N184 Chronic kidney disease, stage 4 (severe): Secondary | ICD-10-CM | POA: Diagnosis not present

## 2022-01-16 DIAGNOSIS — N184 Chronic kidney disease, stage 4 (severe): Secondary | ICD-10-CM | POA: Diagnosis not present

## 2022-01-16 DIAGNOSIS — D631 Anemia in chronic kidney disease: Secondary | ICD-10-CM | POA: Diagnosis not present

## 2022-01-16 DIAGNOSIS — I951 Orthostatic hypotension: Secondary | ICD-10-CM | POA: Diagnosis not present

## 2022-01-16 DIAGNOSIS — Z471 Aftercare following joint replacement surgery: Secondary | ICD-10-CM | POA: Diagnosis not present

## 2022-01-16 DIAGNOSIS — I5022 Chronic systolic (congestive) heart failure: Secondary | ICD-10-CM | POA: Diagnosis not present

## 2022-01-16 DIAGNOSIS — I130 Hypertensive heart and chronic kidney disease with heart failure and stage 1 through stage 4 chronic kidney disease, or unspecified chronic kidney disease: Secondary | ICD-10-CM | POA: Diagnosis not present

## 2022-01-19 DIAGNOSIS — D631 Anemia in chronic kidney disease: Secondary | ICD-10-CM | POA: Diagnosis not present

## 2022-01-19 DIAGNOSIS — I130 Hypertensive heart and chronic kidney disease with heart failure and stage 1 through stage 4 chronic kidney disease, or unspecified chronic kidney disease: Secondary | ICD-10-CM | POA: Diagnosis not present

## 2022-01-19 DIAGNOSIS — Z471 Aftercare following joint replacement surgery: Secondary | ICD-10-CM | POA: Diagnosis not present

## 2022-01-19 DIAGNOSIS — N184 Chronic kidney disease, stage 4 (severe): Secondary | ICD-10-CM | POA: Diagnosis not present

## 2022-01-19 DIAGNOSIS — I5022 Chronic systolic (congestive) heart failure: Secondary | ICD-10-CM | POA: Diagnosis not present

## 2022-01-19 DIAGNOSIS — I951 Orthostatic hypotension: Secondary | ICD-10-CM | POA: Diagnosis not present

## 2022-01-22 ENCOUNTER — Other Ambulatory Visit (HOSPITAL_COMMUNITY): Payer: Self-pay | Admitting: *Deleted

## 2022-01-22 DIAGNOSIS — Z95811 Presence of heart assist device: Secondary | ICD-10-CM

## 2022-01-22 DIAGNOSIS — Z7901 Long term (current) use of anticoagulants: Secondary | ICD-10-CM

## 2022-01-23 ENCOUNTER — Other Ambulatory Visit (HOSPITAL_COMMUNITY): Payer: Self-pay | Admitting: Internal Medicine

## 2022-01-23 DIAGNOSIS — I493 Ventricular premature depolarization: Secondary | ICD-10-CM

## 2022-01-23 DIAGNOSIS — I951 Orthostatic hypotension: Secondary | ICD-10-CM | POA: Diagnosis not present

## 2022-01-23 DIAGNOSIS — D631 Anemia in chronic kidney disease: Secondary | ICD-10-CM | POA: Diagnosis not present

## 2022-01-23 DIAGNOSIS — Z471 Aftercare following joint replacement surgery: Secondary | ICD-10-CM | POA: Diagnosis not present

## 2022-01-23 DIAGNOSIS — I130 Hypertensive heart and chronic kidney disease with heart failure and stage 1 through stage 4 chronic kidney disease, or unspecified chronic kidney disease: Secondary | ICD-10-CM | POA: Diagnosis not present

## 2022-01-23 DIAGNOSIS — Z95811 Presence of heart assist device: Secondary | ICD-10-CM

## 2022-01-23 DIAGNOSIS — I472 Ventricular tachycardia, unspecified: Secondary | ICD-10-CM

## 2022-01-23 DIAGNOSIS — I5022 Chronic systolic (congestive) heart failure: Secondary | ICD-10-CM | POA: Diagnosis not present

## 2022-01-23 DIAGNOSIS — N184 Chronic kidney disease, stage 4 (severe): Secondary | ICD-10-CM | POA: Diagnosis not present

## 2022-01-27 ENCOUNTER — Other Ambulatory Visit (HOSPITAL_COMMUNITY): Payer: Self-pay

## 2022-01-27 ENCOUNTER — Other Ambulatory Visit (HOSPITAL_COMMUNITY): Payer: Self-pay | Admitting: Unknown Physician Specialty

## 2022-01-27 DIAGNOSIS — Z95811 Presence of heart assist device: Secondary | ICD-10-CM

## 2022-01-27 DIAGNOSIS — Z7901 Long term (current) use of anticoagulants: Secondary | ICD-10-CM

## 2022-01-28 ENCOUNTER — Encounter (HOSPITAL_COMMUNITY): Payer: Self-pay

## 2022-01-28 ENCOUNTER — Ambulatory Visit (HOSPITAL_COMMUNITY): Payer: Self-pay | Admitting: Pharmacist

## 2022-01-28 ENCOUNTER — Ambulatory Visit (HOSPITAL_COMMUNITY)
Admission: RE | Admit: 2022-01-28 | Discharge: 2022-01-28 | Disposition: A | Discharge status: Home / Self Care | Payer: Medicare Other | Source: Ambulatory Visit | Attending: Internal Medicine | Admitting: Internal Medicine

## 2022-01-28 ENCOUNTER — Encounter (HOSPITAL_COMMUNITY): Payer: Medicare Other

## 2022-01-28 VITALS — BP 120/0 | HR 81 | Temp 98.2°F | Wt 161.0 lb

## 2022-01-28 DIAGNOSIS — R630 Anorexia: Secondary | ICD-10-CM | POA: Diagnosis not present

## 2022-01-28 DIAGNOSIS — D631 Anemia in chronic kidney disease: Secondary | ICD-10-CM | POA: Diagnosis not present

## 2022-01-28 DIAGNOSIS — Z471 Aftercare following joint replacement surgery: Secondary | ICD-10-CM | POA: Diagnosis not present

## 2022-01-28 DIAGNOSIS — I429 Cardiomyopathy, unspecified: Secondary | ICD-10-CM | POA: Diagnosis not present

## 2022-01-28 DIAGNOSIS — E039 Hypothyroidism, unspecified: Secondary | ICD-10-CM | POA: Insufficient documentation

## 2022-01-28 DIAGNOSIS — I493 Ventricular premature depolarization: Secondary | ICD-10-CM | POA: Diagnosis not present

## 2022-01-28 DIAGNOSIS — I130 Hypertensive heart and chronic kidney disease with heart failure and stage 1 through stage 4 chronic kidney disease, or unspecified chronic kidney disease: Secondary | ICD-10-CM | POA: Diagnosis not present

## 2022-01-28 DIAGNOSIS — I2510 Atherosclerotic heart disease of native coronary artery without angina pectoris: Secondary | ICD-10-CM | POA: Diagnosis not present

## 2022-01-28 DIAGNOSIS — E785 Hyperlipidemia, unspecified: Secondary | ICD-10-CM | POA: Insufficient documentation

## 2022-01-28 DIAGNOSIS — I951 Orthostatic hypotension: Secondary | ICD-10-CM | POA: Diagnosis not present

## 2022-01-28 DIAGNOSIS — R112 Nausea with vomiting, unspecified: Secondary | ICD-10-CM | POA: Diagnosis not present

## 2022-01-28 DIAGNOSIS — I5022 Chronic systolic (congestive) heart failure: Secondary | ICD-10-CM | POA: Insufficient documentation

## 2022-01-28 DIAGNOSIS — Z7901 Long term (current) use of anticoagulants: Secondary | ICD-10-CM

## 2022-01-28 DIAGNOSIS — N184 Chronic kidney disease, stage 4 (severe): Secondary | ICD-10-CM | POA: Diagnosis not present

## 2022-01-28 DIAGNOSIS — R509 Fever, unspecified: Secondary | ICD-10-CM | POA: Insufficient documentation

## 2022-01-28 DIAGNOSIS — I4892 Unspecified atrial flutter: Secondary | ICD-10-CM | POA: Insufficient documentation

## 2022-01-28 DIAGNOSIS — Z95810 Presence of automatic (implantable) cardiac defibrillator: Secondary | ICD-10-CM | POA: Diagnosis not present

## 2022-01-28 DIAGNOSIS — Z95811 Presence of heart assist device: Secondary | ICD-10-CM

## 2022-01-28 DIAGNOSIS — Z7982 Long term (current) use of aspirin: Secondary | ICD-10-CM | POA: Diagnosis not present

## 2022-01-28 DIAGNOSIS — R5383 Other fatigue: Secondary | ICD-10-CM | POA: Diagnosis not present

## 2022-01-28 DIAGNOSIS — I255 Ischemic cardiomyopathy: Secondary | ICD-10-CM | POA: Diagnosis not present

## 2022-01-28 DIAGNOSIS — Z79899 Other long term (current) drug therapy: Secondary | ICD-10-CM | POA: Insufficient documentation

## 2022-01-28 LAB — COMPREHENSIVE METABOLIC PANEL
ALT: 13 U/L (ref 0–44)
AST: 18 U/L (ref 15–41)
Albumin: 3.6 g/dL (ref 3.5–5.0)
Alkaline Phosphatase: 78 U/L (ref 38–126)
Anion gap: 9 (ref 5–15)
BUN: 27 mg/dL — ABNORMAL HIGH (ref 8–23)
CO2: 25 mmol/L (ref 22–32)
Calcium: 9 mg/dL (ref 8.9–10.3)
Chloride: 104 mmol/L (ref 98–111)
Creatinine, Ser: 2.32 mg/dL — ABNORMAL HIGH (ref 0.61–1.24)
GFR, Estimated: 28 mL/min — ABNORMAL LOW (ref >60)
Glucose, Bld: 149 mg/dL — ABNORMAL HIGH (ref 70–99)
Potassium: 3.8 mmol/L (ref 3.5–5.1)
Sodium: 138 mmol/L (ref 135–145)
Total Bilirubin: 1.1 mg/dL (ref 0.3–1.2)
Total Protein: 6.9 g/dL (ref 6.5–8.1)

## 2022-01-28 LAB — CBC
HCT: 27.3 % — ABNORMAL LOW (ref 39.0–52.0)
Hemoglobin: 8.4 g/dL — ABNORMAL LOW (ref 13.0–17.0)
MCH: 29.5 pg (ref 26.0–34.0)
MCHC: 30.8 g/dL (ref 30.0–36.0)
MCV: 95.8 fL (ref 80.0–100.0)
Platelets: 173 10*3/uL (ref 150–400)
RBC: 2.85 MIL/uL — ABNORMAL LOW (ref 4.22–5.81)
RDW: 16.4 % — ABNORMAL HIGH (ref 11.5–15.5)
WBC: 7.2 10*3/uL (ref 4.0–10.5)
nRBC: 0 % (ref 0.0–0.2)

## 2022-01-28 LAB — PROTIME-INR
INR: 1.8 — ABNORMAL HIGH (ref 0.8–1.2)
Prothrombin Time: 20.6 seconds — ABNORMAL HIGH (ref 11.4–15.2)

## 2022-01-28 LAB — LACTATE DEHYDROGENASE: LDH: 205 U/L — ABNORMAL HIGH (ref 98–192)

## 2022-01-28 NOTE — Progress Notes (Signed)
VAD CLINIC NOTE   HPI:  Johnny Oneill is a 81 y.o. 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   Presented to ER on 06/27/19 with syncopal episode and head injury from fall. ECG showed VT @ 240. Underwent emergent DC-CV in ER. Head CT no bleed. Admitted and had ICD gen replacement (EOL). -> Did not tolerate amio due to tremors.  Had ICD shock for VT on 06/09/20. Started on sotalol  On 02/06/21 Paced out of AFL.  Seen in ER 05/2021 due to LOW FLOW alarms. Found to be in VT rate 170. DCCV x 1 - returned to [place] with frequent PVCs. Pacer settings adjusted by [name] PA- DDD 90.  [place] 11/22 to assess for RV failure RA = 5 RV = 33/5 PA = 33/13 (20) PCW = 10 Fick cardiac output/index = 4.0/2.1 Thermo CO/CI = 4.9/2.6 PVR = 2.0 WU Ao sat = 95% PA sat = 54%, 59% PAPI = 4.0  Admitted 12/01/21 for R TKA. Did well with surgery but complicated by severe orthostatic hypotension. Midodrine added    Admitted 12/18/21 with syncope. No events on device. Hydrated. Midodrine increased to 15 tid. Sotalol held  Admitted 12/28/21 with recurrent VT. Had DC-CV in ER. Sotalol restarted. Continued to have elevated MAPs as well as orthostasis. Midodrine decreased and florinef added.   Today he returns for an acute VAD visit due to fatigue/fever.  Last visit florinef added. N/V the last 2 mornings but has been able to eat lunch/dinner without any issues. Denies SOB/PND/Orthopnea. Having some lower extremity edema. No bleeding issues. No fever or chills. Weight at home has been stable. No falls.  Taking all medications    VAD Indication: Destination therapy- patient choice     VAD interrogation & Equipment Management: Speed: 9600 Flow: 4.9 Power: 5.9 PI: 6.8    Alarms: none Events: 10-20 PI events.  Fixed speed 9600  Low speed limit: 9000   Primary Controller:  Replace back up battery in 29 months Back up  controller:  Replace back up battery in 26 months   Annual Equipment Maintenance on UBC/PM was performed 09/23/21   I reviewed the LVAD parameters from today and compared the results to the patient's prior recorded data. LVAD interrogation was NEGATIVE for significant power changes, NEGATIVE for clinical alarms and STABLE for PI events/speed drops. No programming changes were made and pump is functioning within specified parameters. Pt is performing daily controller and system monitor self tests along with completing weekly and monthly maintenance for LVAD equipment.    Past Medical History:  Diagnosis Date   Anxiety    Automatic implantable cardioverter-defibrillator in situ    CAD (coronary artery disease)    S/P stenting of LCX in 2004;  s/p Lat MI 2009 with occlusion of the LCX - treated with Promus stenting. Has total occlusion of the RCA.    CHF (congestive heart failure) (HCC)    Hypercholesteremia    Hypertension    Hypothyroidism    ICD (implantable cardiac defibrillator) in place    PROPHYLACTIC      medtronic   Inferior MI ([place]) "? date"; 2009   Ischemic cardiomyopathy    a. 10/09 Echo: Sev LV Dysfxn, inf/lat AK, Mod MR.   Liver disease    LVAD (left ventricular assist device) present [facility])    Osteoarthritis    a. s/p R TKR   Pacemaker  PVC (premature ventricular contraction)    RBBB    Shortness of breath     Current Outpatient Medications  Medication Sig Dispense Refill   acetaminophen (TYLENOL) 500 MG tablet Take 1,000 mg by mouth every 6 (six) hours as needed (pain).     aspirin EC 81 MG tablet Take 81 mg by mouth in the morning.     Cholecalciferol (VITAMIN D) 125 MCG (5000 UT) CAPS Take 5,000 Units by mouth in the morning.     citalopram (CELEXA) 10 MG tablet Take 1 tablet by mouth once daily (Patient taking differently: Take 10 mg by mouth every morning.) 90 tablet 3   Coenzyme Q10 (COQ10) 200 MG CAPS Take 200 mg by mouth in the morning.     Famotidine  (PEPCID PO) Take 1 tablet by mouth every morning.     fludrocortisone (FLORINEF) 0.1 MG tablet Take 1 tablet (0.1 mg total) by mouth daily. 30 tablet 1   furosemide (LASIX) 20 MG tablet Take 1 tablet (20 mg total) by mouth daily as needed (Take 20 mg as daily as needed for wt > 153 lbs). Reported on 01/14/2016 (Patient not taking: Reported on 11/26/2021) 30 tablet 6   melatonin 5 MG TABS Take 5 mg by mouth at bedtime.     mexiletine (MEXITIL) 150 MG capsule Take 1 capsule by mouth twice daily 60 capsule 6   midodrine (PROAMATINE) 5 MG tablet Take 1 tablet (5 mg total) by mouth 3 (three) times daily with meals. 90 tablet 1   oxyCODONE (OXY IR/ROXICODONE) 5 MG immediate release tablet Take 1 tablet (5 mg total) by mouth every 12 (twelve) hours as needed for breakthrough pain. (Patient not taking: Reported on 01/08/2022) 30 tablet 0   pantoprazole (PROTONIX) 40 MG tablet Take 1 tablet by mouth once daily (Patient taking differently: 40 mg every morning.) 90 tablet 3   Probiotic Product (PROBIOTIC PO) Take 1 capsule by mouth in the morning.     rosuvastatin (CRESTOR) 20 MG tablet Take 1 tablet (20 mg total) by mouth daily. (Patient taking differently: Take 20 mg by mouth every morning.) 90 tablet 3   sotalol (BETAPACE) 80 MG tablet Take 1 tablet (80 mg total) by mouth daily. 30 tablet 1   SYNTHROID 175 MCG tablet 1 tablet daily every morning except half tablet on Sundays (Patient taking differently: Take 87.5-175 mcg by mouth See admin instructions. Take one tablet (175 mcg) by mouth before breakfast Monday thru Saturday, take 1/2 tablet (87.5 mcg) on Sundays before breakfast) 30 tablet 3   traMADol (ULTRAM) 50 MG tablet Take 1-2 tablets (50-100 mg total) by mouth every 6 (six) hours as needed for moderate pain. 40 tablet 5   traZODone (DESYREL) 50 MG tablet Take 1 tablet (50 mg total) by mouth at bedtime. 30 tablet 5   warfarin (COUMADIN) 5 MG tablet Take 7.5 mg 4/14 then 5 mg (1 tablet) every day as  directed by HF Clinic (Patient taking differently: Take 5 mg by mouth at bedtime. Or as directed by HF Clinic) 135 tablet 11   No current facility-administered medications for this encounter.   Facility-Administered Medications Ordered in Other Encounters  Medication Dose Route Frequency Provider Last Rate Last Admin   etomidate (AMIDATE) injection    Anesthesia Intra-op Myna Bright, CRNA   20 mg at 02/08/14 0915   rocuronium [facility]) injection    Anesthesia Intra-op Myna Bright, CRNA   50 mg at 02/08/14 0932   succinylcholine (ANECTINE) injection  Anesthesia Intra-op Myna Bright, CRNA   100 mg at 02/08/14 0915   Ace inhibitors, Amiodarone, Diovan [valsartan], Lipitor [atorvastatin calcium], Jardiance [empagliflozin], and Norvasc [amlodipine]  Review of systems complete and found to be negative unless listed in HPI.   Vitals:   01/28/22 1036  BP: (!) 132/106  Pulse: 81  Temp: 98.2 F (36.8 C)  SpO2: 100%   Wt Readings from Last 3 Encounters:  01/28/22 73 kg (161 lb)  01/14/22 70.4 kg (155 lb 3.2 oz)  01/08/22 69.6 kg (153 lb 6.4 oz)     Physical Exam: GENERAL: No acute distress. HEENT: normal  NECK: Supple, JVP  8-9  2+ bilaterally, no bruits.  No lymphadenopathy or thyromegaly appreciated.   CARDIAC:  Mechanical heart sounds with LVAD hum present.  LUNGS:  Clear to auscultation bilaterally.  ABDOMEN:  Soft, round, nontender, positive bowel sounds x4.     LVAD exit site: well-healed and incorporated.  Dressing dry and intact.  No erythema or drainage.  Stabilization device present and accurately applied.  Driveline dressing is being changed daily per sterile technique. EXTREMITIES:  Warm and dry, no cyanosis, clubbing, rash. R>L 1+  edema  NEUROLOGIC:  Alert and oriented x 3.    No aphasia.  No dysarthria.  Affect pleasant.     EKG : AV paced BPM   ASSESSMENT AND PLAN:   1) Weakness/anorexia - Improved with CBD gummies. Will continue to monitor.  -  WBC is not elevated.  - Previously wondered if RV pacing may be playing a role. He is following with EP but options limited here. Likely not ideal candidate for CRT upgrade at this time.   2) Chronic systolic HF: ICM, EF 83-38% s/p LVAD HM II implant 08/2013 for DT.   - Echo 3/18 EF 10-15% mild AI. Mod RV dysfunction. LVAD cannula OK - has struggled with RV failure however RHC 11/22 looked good (despite evidence of RVF on LE dopplers showing reflux - Improved NYHA III - Volume status mildly elevated. Can take 20 mg po lasix.  - Failed Jardiance due to volume depletion - He has not tolerated ACE-I or amlodipine or b-blocker in the past.  3) LVAD: HMII 08/2013 for DT. - VAD interrogated personally. Parameters stable. - Admitted in May 2018 with elevated LDH with bival.  - Continue aspirin and coumadin.  - LDH ordered  - DL site ok - MAPs elevated will need to stop midodrine. Asked his wife to monitor blood pressure.  - Continue florinef.  - Continue warfarin INR 1.8 Goal 2.0-3.0. Discussed dosing with PharmD personally   4) Orthostatic hypotension, post-op - continue fluid intake and compression hose - Resolving. Stop midodrine.  - Continue florinef - plan as above  5) VT/Frequent PVCs - now s/p ICD gen change.  - Off amio due to cerebellar side effects.  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 - VT  06/16/21 with  DC-CV x 1 - Recurrent VT 12/28/21 after stopping sotalol. Sotalol restarted. QT ok.  - PVCs improved on mexilitene. Continue mexilitene 150 bid - Keep K > 4.0 Mg > 2.0 - Continue to f/u with EP. Options limited  6) Atrial flutter, recurrent - patient paced out of AFL in 6/22. Atrial therapies enabled  - continue sotalol (on for VT). QTs slightly prolonged but stable - In SR today.   7) Chronic anticoagulation: INR goal 2.0-3.0.    - INR 1.8   See above - Continue ASA 81 mg for LVAD + warfarin.  8) HTN:  - MAPs elevated as above. Stop midodrine.    9) Hypothyroidism:  -  Followed by Dr Dwyane Dee. Stable.No change   10) Hyperlipidemia:  - Continue Crestor. Zetia stopped due to cost.  - No change.   11) CAD - no s/s ischemia - continue Crestor  12) CKD IV - Creatinine baseline 1.7-2.0 - Check BMET  - failed Jardiance - Has Nephrology referral  13) Anemia  Hgb 8.4  Iron Sats 25% , floate, B12 ok.   Follow up in 2 weeks.    Darrick Grinder, NP  10:14 AM   Patient seen and examined with the above-signed Advanced Practice Provider and/or Housestaff. I personally reviewed laboratory data, imaging studies and relevant notes. I independently examined the patient and formulated the important aspects of the plan. I have edited the note to reflect any of my changes or salient points. I have personally discussed the plan with the patient and/or family.  Here fo sick visit due to low-grade fever last night and some n/v and LE edema.   Overall getting stronger but didn't feel well yesterday. Now AF. Able to hold food down. Having mile LE edema. No dysuria or cough. No problems with R TKA  General:  NAD.  HEENT: normal  Neck: supple. JVP 9-10 Carotids 2+ bilat; no bruits. No lymphadenopathy or thryomegaly appreciated. Cor: LVAD hum.  Lungs: Clear. Abdomen: obese soft, nontender, non-distended. No hepatosplenomegaly. No bruits or masses. Good bowel sounds. Driveline site clean. Anchor in place.  Extremities: no cyanosis, clubbing, rash. Warm TR-1+ R>L  edema  Neuro: alert & oriented x 3. No focal deficits. Moves all 4 without problem   MAPs high. Can stop midodrine. Continue Florinef. Take lasix as needed. Follow fever curve. VAD interrogated personally. Parameters stable. Labs ok.   Glori Bickers, MD  3:51 PM

## 2022-01-28 NOTE — Progress Notes (Signed)
Patient presents for sick visit in [facility] today with [name]. Reports no problems with VAD equipment or concerns with drive line.   Pt arrived to clinic using a cane today. Pt states that he has vomited the past 2 mornings. He states that he feels weak and has swelling in his legs. He is still taking the CBD gummies and this has increased his appetite. Pt states that he is eating much better.   Wife states that he had a low grade fever yesterday morning of 100.  Pt is currently having home PT twice a week. Pt tells me that his last visit is today.  Vital Signs:   Doppler Pressure: 120 Automatc BP: 132/106 (115)  HR: 81 SPO2: 100% on RA   Weight: 162.2 lb w/ eqt Last weight: 155.2 lb w/eqt  VAD Indication: Destination therapy- patient choice     VAD interrogation & Equipment Management: Speed: 9600 Flow: 3.5 Power: 5.2 w    PI: 6.8   Alarms: none Events: 10-20 PI events daily  Fixed speed 9600  Low speed limit: 9000   Primary Controller:  Replace back up battery in 29 months Back up controller:  Replace back up battery in 26 months   Annual Equipment Maintenance on UBC/PM was performed 09/23/21   I reviewed the LVAD parameters from today and compared the results to the patient's prior recorded data. LVAD interrogation was NEGATIVE for significant power changes, NEGATIVE for clinical alarms and STABLE for PI events/speed drops. No programming changes were made and pump is functioning within specified parameters. Pt is performing daily controller and system monitor self tests along with completing weekly and monthly maintenance for LVAD equipment.  Exit Site Care: Drive line is being maintained weekly by [name], his wife. Existing VAD dressing C/D/I Sorbaview dressing and Silverlon patch intact. Exit site healed and incorporated, the velour is fully implanted at exit site. Pt denies fever or chills. Pt has adequate supplies at home.   Significant Events on VAD Support:   10/2013>SBO 12/2016> elevated LDH, admit for Bival   Device: Dual Medtronic Therapies:  on 231  Pacing: AAI<=>DDD 80 bpm  Last check:  06/26/21  BP & Labs:  Doppler 120 - Doppler is reflecting MAP  Hgb 9.3 - No S/S of bleeding. Specifically denies melena/BRBPR or nosebleeds.   LDH 205 - established baseline of 200- 350. Denies tea-colored urine. No power elevations noted on interrogation.    Patient Instructions:  STOP Midodrine. Please call the VAD office if your doppler is below 90. You may take 1 Lasix tablet today Coumadin dosing per Lauren PharmD Return to [facility] in 2 weeks for f/u with Dr Haroldine Laws   Tanda Rockers RN [place] Coordinator  Office: [phone]  24/7 Pager: [phone]

## 2022-01-28 NOTE — Patient Instructions (Signed)
STOP Midodrine You may take 1 Lasix tablet today Coumadin dosing per Lauren PharmD Return to [facility] in 2 weeks for f/u with Dr Haroldine Laws

## 2022-01-28 NOTE — Progress Notes (Signed)
LVAD INR 

## 2022-02-02 ENCOUNTER — Other Ambulatory Visit: Payer: Self-pay | Admitting: *Deleted

## 2022-02-02 ENCOUNTER — Other Ambulatory Visit (HOSPITAL_COMMUNITY): Payer: Self-pay | Admitting: *Deleted

## 2022-02-02 ENCOUNTER — Other Ambulatory Visit (HOSPITAL_COMMUNITY): Payer: Self-pay | Admitting: Internal Medicine

## 2022-02-02 DIAGNOSIS — Z7952 Long term (current) use of systemic steroids: Secondary | ICD-10-CM | POA: Diagnosis not present

## 2022-02-02 DIAGNOSIS — F419 Anxiety disorder, unspecified: Secondary | ICD-10-CM | POA: Diagnosis not present

## 2022-02-02 DIAGNOSIS — I5022 Chronic systolic (congestive) heart failure: Secondary | ICD-10-CM | POA: Diagnosis not present

## 2022-02-02 DIAGNOSIS — E039 Hypothyroidism, unspecified: Secondary | ICD-10-CM | POA: Diagnosis not present

## 2022-02-02 DIAGNOSIS — M109 Gout, unspecified: Secondary | ICD-10-CM | POA: Diagnosis not present

## 2022-02-02 DIAGNOSIS — Z7901 Long term (current) use of anticoagulants: Secondary | ICD-10-CM

## 2022-02-02 DIAGNOSIS — D631 Anemia in chronic kidney disease: Secondary | ICD-10-CM | POA: Diagnosis not present

## 2022-02-02 DIAGNOSIS — I428 Other cardiomyopathies: Secondary | ICD-10-CM | POA: Diagnosis not present

## 2022-02-02 DIAGNOSIS — N184 Chronic kidney disease, stage 4 (severe): Secondary | ICD-10-CM | POA: Diagnosis not present

## 2022-02-02 DIAGNOSIS — Z95811 Presence of heart assist device: Secondary | ICD-10-CM

## 2022-02-02 DIAGNOSIS — I2510 Atherosclerotic heart disease of native coronary artery without angina pectoris: Secondary | ICD-10-CM | POA: Diagnosis not present

## 2022-02-02 DIAGNOSIS — I4892 Unspecified atrial flutter: Secondary | ICD-10-CM | POA: Diagnosis not present

## 2022-02-02 DIAGNOSIS — Z96651 Presence of right artificial knee joint: Secondary | ICD-10-CM | POA: Diagnosis not present

## 2022-02-02 DIAGNOSIS — Z471 Aftercare following joint replacement surgery: Secondary | ICD-10-CM | POA: Diagnosis not present

## 2022-02-02 DIAGNOSIS — K769 Liver disease, unspecified: Secondary | ICD-10-CM | POA: Diagnosis not present

## 2022-02-02 DIAGNOSIS — E7800 Pure hypercholesterolemia, unspecified: Secondary | ICD-10-CM | POA: Diagnosis not present

## 2022-02-02 DIAGNOSIS — I252 Old myocardial infarction: Secondary | ICD-10-CM | POA: Diagnosis not present

## 2022-02-02 DIAGNOSIS — I130 Hypertensive heart and chronic kidney disease with heart failure and stage 1 through stage 4 chronic kidney disease, or unspecified chronic kidney disease: Secondary | ICD-10-CM | POA: Diagnosis not present

## 2022-02-02 DIAGNOSIS — I255 Ischemic cardiomyopathy: Secondary | ICD-10-CM | POA: Diagnosis not present

## 2022-02-02 DIAGNOSIS — Z7982 Long term (current) use of aspirin: Secondary | ICD-10-CM | POA: Diagnosis not present

## 2022-02-02 DIAGNOSIS — I472 Ventricular tachycardia, unspecified: Secondary | ICD-10-CM | POA: Diagnosis not present

## 2022-02-02 DIAGNOSIS — K4090 Unilateral inguinal hernia, without obstruction or gangrene, not specified as recurrent: Secondary | ICD-10-CM | POA: Diagnosis not present

## 2022-02-02 DIAGNOSIS — M79675 Pain in left toe(s): Secondary | ICD-10-CM | POA: Diagnosis not present

## 2022-02-02 DIAGNOSIS — I493 Ventricular premature depolarization: Secondary | ICD-10-CM | POA: Diagnosis not present

## 2022-02-02 DIAGNOSIS — I4510 Unspecified right bundle-branch block: Secondary | ICD-10-CM | POA: Diagnosis not present

## 2022-02-02 DIAGNOSIS — I951 Orthostatic hypotension: Secondary | ICD-10-CM | POA: Diagnosis not present

## 2022-02-03 ENCOUNTER — Ambulatory Visit (HOSPITAL_COMMUNITY): Payer: Self-pay | Admitting: Pharmacist

## 2022-02-03 ENCOUNTER — Ambulatory Visit (HOSPITAL_COMMUNITY)
Admission: RE | Admit: 2022-02-03 | Discharge: 2022-02-03 | Disposition: A | Discharge status: Home / Self Care | Payer: Medicare Other | Source: Ambulatory Visit | Attending: Cardiology | Admitting: Cardiology

## 2022-02-03 ENCOUNTER — Encounter (HOSPITAL_COMMUNITY): Payer: Self-pay

## 2022-02-03 DIAGNOSIS — Z95811 Presence of heart assist device: Secondary | ICD-10-CM | POA: Insufficient documentation

## 2022-02-03 DIAGNOSIS — Z7901 Long term (current) use of anticoagulants: Secondary | ICD-10-CM | POA: Insufficient documentation

## 2022-02-03 LAB — CBC
HCT: 26.6 % — ABNORMAL LOW (ref 39.0–52.0)
Hemoglobin: 8.2 g/dL — ABNORMAL LOW (ref 13.0–17.0)
MCH: 29.3 pg (ref 26.0–34.0)
MCHC: 30.8 g/dL (ref 30.0–36.0)
MCV: 95 fL (ref 80.0–100.0)
Platelets: 210 10*3/uL (ref 150–400)
RBC: 2.8 MIL/uL — ABNORMAL LOW (ref 4.22–5.81)
RDW: 17 % — ABNORMAL HIGH (ref 11.5–15.5)
WBC: 5.2 10*3/uL (ref 4.0–10.5)
nRBC: 0 % (ref 0.0–0.2)

## 2022-02-03 LAB — PROTIME-INR
INR: 2.8 — ABNORMAL HIGH (ref 0.8–1.2)
Prothrombin Time: 29.4 seconds — ABNORMAL HIGH (ref 11.4–15.2)

## 2022-02-03 LAB — LACTATE DEHYDROGENASE: LDH: 217 U/L — ABNORMAL HIGH (ref 98–192)

## 2022-02-03 NOTE — Patient Instructions (Signed)
Call VAD coordinator over the weekend if you begin to feel poorly, or signs of bleeding noted.  2. Coumadin dosing per Ander Purpura PharmD 3. Return to clinic next week for previously scheduled appt

## 2022-02-03 NOTE — Progress Notes (Signed)
LVAD INR 

## 2022-02-03 NOTE — Addendum Note (Signed)
Encounter addended by: Mertha Baars, RN on: 02/03/2022 11:09 AM  Actions taken: Vitals modified, Clinical Note Signed

## 2022-02-03 NOTE — Progress Notes (Signed)
Patient presents for labs in [facility] today with [name]. Reports no problems with VAD equipment or concerns with drive line.   Pt arrived to clinic using a cane today. He is pale and says he is still feeling weak at times. CBC, INR, LDH drawn today. Denies dizziness, lightheadedness, falls, shortness of breath, and signs of bleeding. Reports his ankles swell in the evenings after being up all day. Encouraged him to wear compression hose during the day to help with this. He verbalized understanding.   He is still taking the CBD gummies and this has increased his appetite. Pt states that he is eating much better.   Vital Signs:   Doppler Pressure: 130 Automatc BP: 124/96 (105) HR: 87 SR SPO2: 100% on RA   VAD interrogation & Equipment Management: Speed: 9600 Flow: 3.3 Power: 5.0 w    PI: 6.1   Alarms: none Events: 0-10 daily  Fixed speed 9600  Low speed limit: 9000   BP & Labs:  Doppler 130 - Doppler is reflecting modified systolic  Hgb 8.4 - No S/S of bleeding. Specifically denies melena/BRBPR or nosebleeds.   LDH 217 - established baseline of 200- 350. Denies tea-colored urine. No power elevations noted on interrogation.    Patient Instructions:  Call VAD coordinator over the weekend if you begin to feel poorly, or signs of bleeding noted.  2. Coumadin dosing per Ander Purpura PharmD 3. Return to clinic next week for previously scheduled appt  Emerson Monte RN [place] Coordinator  Office: [phone]  24/7 Pager: [phone]

## 2022-02-06 ENCOUNTER — Telehealth (HOSPITAL_COMMUNITY): Payer: Self-pay | Admitting: *Deleted

## 2022-02-06 ENCOUNTER — Other Ambulatory Visit (HOSPITAL_COMMUNITY): Payer: Self-pay | Admitting: *Deleted

## 2022-02-06 DIAGNOSIS — Z7901 Long term (current) use of anticoagulants: Secondary | ICD-10-CM

## 2022-02-06 DIAGNOSIS — R6889 Other general symptoms and signs: Secondary | ICD-10-CM

## 2022-02-06 DIAGNOSIS — Z95811 Presence of heart assist device: Secondary | ICD-10-CM

## 2022-02-06 NOTE — Telephone Encounter (Addendum)
Received call from patient's wife Johnny Oneill reporting pt's weight has gone up 2 lb per day for the last 3 days. Mild shortness of breath with ankle swelling noted. He is eating, but appetite is not at baseline. Also reports he vomited/spit up clear mucous at 0400 and again this morning. She thinks he may have a cold. Encouraged her to push fluids if pt is vomiting, and may use approved cough/cold medication on list provided in VAD notebook. Advised she may want to make appt at PCP if she feels he may have a sinus infection. Advised he may take Lasix x 1 today for wt gain and shortness of breath. Advised to call VAD pager if symptoms worsen. She verbalized understanding.   Johnny Monte RN [place] Coordinator  Office: [phone]  24/7 Pager: [phone]

## 2022-02-09 ENCOUNTER — Encounter (HOSPITAL_COMMUNITY): Payer: Self-pay | Admitting: Internal Medicine

## 2022-02-09 ENCOUNTER — Ambulatory Visit
Admission: RE | Admit: 2022-02-09 | Discharge: 2022-02-09 | Disposition: A | Discharge status: Home / Self Care | Payer: Self-pay | Source: Ambulatory Visit | Attending: Cardiology | Admitting: Cardiology

## 2022-02-09 ENCOUNTER — Inpatient Hospital Stay (HOSPITAL_COMMUNITY)
Admission: RE | Admit: 2022-02-09 | Discharge: 2022-02-16 | DRG: 286 | Disposition: A | Discharge status: Home Health | Payer: Medicare Other | Source: Ambulatory Visit | Attending: Internal Medicine | Admitting: Internal Medicine

## 2022-02-09 ENCOUNTER — Ambulatory Visit (HOSPITAL_COMMUNITY): Payer: Self-pay | Admitting: Pharmacist

## 2022-02-09 ENCOUNTER — Other Ambulatory Visit: Payer: Self-pay

## 2022-02-09 ENCOUNTER — Inpatient Hospital Stay (HOSPITAL_COMMUNITY): Payer: Medicare Other

## 2022-02-09 ENCOUNTER — Ambulatory Visit (HOSPITAL_BASED_OUTPATIENT_CLINIC_OR_DEPARTMENT_OTHER)
Admission: RE | Admit: 2022-02-09 | Discharge: 2022-02-09 | Disposition: A | Discharge status: Home / Self Care | Payer: Medicare Other | Source: Ambulatory Visit | Attending: Internal Medicine | Admitting: Internal Medicine

## 2022-02-09 ENCOUNTER — Encounter (HOSPITAL_COMMUNITY): Payer: Self-pay

## 2022-02-09 ENCOUNTER — Other Ambulatory Visit (HOSPITAL_COMMUNITY): Payer: Self-pay

## 2022-02-09 ENCOUNTER — Ambulatory Visit (HOSPITAL_COMMUNITY): Admission: RE | Admit: 2022-02-09 | Payer: Medicare Other | Source: Ambulatory Visit

## 2022-02-09 ENCOUNTER — Other Ambulatory Visit (HOSPITAL_COMMUNITY): Payer: Self-pay | Admitting: *Deleted

## 2022-02-09 VITALS — BP 131/106 | HR 84 | Wt 161.4 lb

## 2022-02-09 DIAGNOSIS — N184 Chronic kidney disease, stage 4 (severe): Secondary | ICD-10-CM | POA: Diagnosis not present

## 2022-02-09 DIAGNOSIS — I50813 Acute on chronic right heart failure: Secondary | ICD-10-CM | POA: Diagnosis present

## 2022-02-09 DIAGNOSIS — R570 Cardiogenic shock: Secondary | ICD-10-CM | POA: Diagnosis present

## 2022-02-09 DIAGNOSIS — Z95810 Presence of automatic (implantable) cardiac defibrillator: Secondary | ICD-10-CM | POA: Diagnosis not present

## 2022-02-09 DIAGNOSIS — R6889 Other general symptoms and signs: Secondary | ICD-10-CM

## 2022-02-09 DIAGNOSIS — I252 Old myocardial infarction: Secondary | ICD-10-CM

## 2022-02-09 DIAGNOSIS — I2510 Atherosclerotic heart disease of native coronary artery without angina pectoris: Secondary | ICD-10-CM | POA: Diagnosis present

## 2022-02-09 DIAGNOSIS — R627 Adult failure to thrive: Secondary | ICD-10-CM

## 2022-02-09 DIAGNOSIS — Z6824 Body mass index (BMI) 24.0-24.9, adult: Secondary | ICD-10-CM

## 2022-02-09 DIAGNOSIS — Z7982 Long term (current) use of aspirin: Secondary | ICD-10-CM | POA: Diagnosis not present

## 2022-02-09 DIAGNOSIS — I130 Hypertensive heart and chronic kidney disease with heart failure and stage 1 through stage 4 chronic kidney disease, or unspecified chronic kidney disease: Principal | ICD-10-CM | POA: Diagnosis present

## 2022-02-09 DIAGNOSIS — K7200 Acute and subacute hepatic failure without coma: Secondary | ICD-10-CM | POA: Diagnosis present

## 2022-02-09 DIAGNOSIS — E869 Volume depletion, unspecified: Secondary | ICD-10-CM | POA: Diagnosis present

## 2022-02-09 DIAGNOSIS — Z8249 Family history of ischemic heart disease and other diseases of the circulatory system: Secondary | ICD-10-CM | POA: Diagnosis not present

## 2022-02-09 DIAGNOSIS — I255 Ischemic cardiomyopathy: Secondary | ICD-10-CM | POA: Diagnosis present

## 2022-02-09 DIAGNOSIS — Z95811 Presence of heart assist device: Secondary | ICD-10-CM

## 2022-02-09 DIAGNOSIS — E039 Hypothyroidism, unspecified: Secondary | ICD-10-CM | POA: Diagnosis present

## 2022-02-09 DIAGNOSIS — E440 Moderate protein-calorie malnutrition: Secondary | ICD-10-CM | POA: Diagnosis present

## 2022-02-09 DIAGNOSIS — E7800 Pure hypercholesterolemia, unspecified: Secondary | ICD-10-CM | POA: Diagnosis present

## 2022-02-09 DIAGNOSIS — D649 Anemia, unspecified: Secondary | ICD-10-CM | POA: Diagnosis present

## 2022-02-09 DIAGNOSIS — I5023 Acute on chronic systolic (congestive) heart failure: Secondary | ICD-10-CM | POA: Diagnosis present

## 2022-02-09 DIAGNOSIS — R630 Anorexia: Secondary | ICD-10-CM | POA: Diagnosis not present

## 2022-02-09 DIAGNOSIS — Z79890 Hormone replacement therapy (postmenopausal): Secondary | ICD-10-CM

## 2022-02-09 DIAGNOSIS — Z955 Presence of coronary angioplasty implant and graft: Secondary | ICD-10-CM | POA: Diagnosis not present

## 2022-02-09 DIAGNOSIS — I472 Ventricular tachycardia, unspecified: Secondary | ICD-10-CM | POA: Diagnosis present

## 2022-02-09 DIAGNOSIS — Z7901 Long term (current) use of anticoagulants: Secondary | ICD-10-CM | POA: Diagnosis not present

## 2022-02-09 DIAGNOSIS — I4892 Unspecified atrial flutter: Secondary | ICD-10-CM | POA: Diagnosis present

## 2022-02-09 DIAGNOSIS — N179 Acute kidney failure, unspecified: Secondary | ICD-10-CM | POA: Diagnosis present

## 2022-02-09 DIAGNOSIS — Z96653 Presence of artificial knee joint, bilateral: Secondary | ICD-10-CM | POA: Diagnosis present

## 2022-02-09 DIAGNOSIS — I5022 Chronic systolic (congestive) heart failure: Secondary | ICD-10-CM | POA: Diagnosis not present

## 2022-02-09 DIAGNOSIS — I951 Orthostatic hypotension: Secondary | ICD-10-CM | POA: Diagnosis present

## 2022-02-09 DIAGNOSIS — Z823 Family history of stroke: Secondary | ICD-10-CM

## 2022-02-09 DIAGNOSIS — I509 Heart failure, unspecified: Secondary | ICD-10-CM | POA: Diagnosis not present

## 2022-02-09 DIAGNOSIS — Z79899 Other long term (current) drug therapy: Secondary | ICD-10-CM

## 2022-02-09 DIAGNOSIS — R791 Abnormal coagulation profile: Secondary | ICD-10-CM | POA: Diagnosis present

## 2022-02-09 LAB — CBC
HCT: 29.2 % — ABNORMAL LOW (ref 39.0–52.0)
Hemoglobin: 8.8 g/dL — ABNORMAL LOW (ref 13.0–17.0)
MCH: 29.3 pg (ref 26.0–34.0)
MCHC: 30.1 g/dL (ref 30.0–36.0)
MCV: 97.3 fL (ref 80.0–100.0)
Platelets: 230 10*3/uL (ref 150–400)
RBC: 3 MIL/uL — ABNORMAL LOW (ref 4.22–5.81)
RDW: 18.9 % — ABNORMAL HIGH (ref 11.5–15.5)
WBC: 6.9 10*3/uL (ref 4.0–10.5)
nRBC: 0.7 % — ABNORMAL HIGH (ref 0.0–0.2)

## 2022-02-09 LAB — COMPREHENSIVE METABOLIC PANEL
ALT: 1003 U/L — ABNORMAL HIGH (ref 0–44)
AST: 537 U/L — ABNORMAL HIGH (ref 15–41)
Albumin: 3.4 g/dL — ABNORMAL LOW (ref 3.5–5.0)
Alkaline Phosphatase: 136 U/L — ABNORMAL HIGH (ref 38–126)
Anion gap: 9 (ref 5–15)
BUN: 29 mg/dL — ABNORMAL HIGH (ref 8–23)
CO2: 25 mmol/L (ref 22–32)
Calcium: 8.7 mg/dL — ABNORMAL LOW (ref 8.9–10.3)
Chloride: 105 mmol/L (ref 98–111)
Creatinine, Ser: 2.41 mg/dL — ABNORMAL HIGH (ref 0.61–1.24)
GFR, Estimated: 27 mL/min — ABNORMAL LOW (ref >60)
Glucose, Bld: 121 mg/dL — ABNORMAL HIGH (ref 70–99)
Potassium: 2.7 mmol/L — CL (ref 3.5–5.1)
Sodium: 139 mmol/L (ref 135–145)
Total Bilirubin: 1.4 mg/dL — ABNORMAL HIGH (ref 0.3–1.2)
Total Protein: 6.6 g/dL (ref 6.5–8.1)

## 2022-02-09 LAB — LACTATE DEHYDROGENASE: LDH: 450 U/L — ABNORMAL HIGH (ref 98–192)

## 2022-02-09 LAB — PROTIME-INR
INR: 5.5 (ref 0.8–1.2)
Prothrombin Time: 49.2 seconds — ABNORMAL HIGH (ref 11.4–15.2)

## 2022-02-09 LAB — TSH: TSH: 4.551 u[IU]/mL — ABNORMAL HIGH (ref 0.350–4.500)

## 2022-02-09 LAB — MAGNESIUM: Magnesium: 1.9 mg/dL (ref 1.7–2.4)

## 2022-02-09 MED ORDER — MELATONIN 5 MG PO TABS
5.0000 mg | ORAL_TABLET | Freq: Every evening | ORAL | Status: DC | PRN
  Administered 2022-02-09 – 2022-02-15 (×7): 5 mg via ORAL
  Filled 2022-02-09 (×7): qty 1

## 2022-02-09 MED ORDER — POTASSIUM CHLORIDE 10 MEQ/100ML IV SOLN
10.0000 meq | Freq: Once | INTRAVENOUS | Status: AC
  Administered 2022-02-09: 10 meq via INTRAVENOUS
  Filled 2022-02-09: qty 100

## 2022-02-09 MED ORDER — MILRINONE LACTATE IN DEXTROSE 20-5 MG/100ML-% IV SOLN
0.1250 ug/kg/min | INTRAVENOUS | Status: DC
  Administered 2022-02-09 – 2022-02-13 (×4): 0.125 ug/kg/min via INTRAVENOUS
  Filled 2022-02-09 (×4): qty 100

## 2022-02-09 MED ORDER — SOTALOL HCL 80 MG PO TABS
80.0000 mg | ORAL_TABLET | Freq: Every day | ORAL | Status: DC
  Administered 2022-02-10 – 2022-02-16 (×7): 80 mg via ORAL
  Filled 2022-02-09 (×7): qty 1

## 2022-02-09 MED ORDER — DRONABINOL 2.5 MG PO CAPS: 2.5000 mg | ORAL_CAPSULE | Freq: Every day | ORAL | 5 refills | Status: DC

## 2022-02-09 MED ORDER — MEXILETINE HCL 150 MG PO CAPS
150.0000 mg | ORAL_CAPSULE | Freq: Two times a day (BID) | ORAL | Status: DC
  Administered 2022-02-09 – 2022-02-16 (×14): 150 mg via ORAL
  Filled 2022-02-09 (×14): qty 1

## 2022-02-09 MED ORDER — ENSURE MAX PROTEIN PO LIQD
11.0000 [oz_av] | Freq: Every day | ORAL | Status: DC
  Filled 2022-02-09 (×2): qty 330

## 2022-02-09 MED ORDER — SODIUM CHLORIDE 0.9 % IV SOLN: INTRAVENOUS | Status: DC | PRN

## 2022-02-09 MED ORDER — POTASSIUM CHLORIDE CRYS ER 20 MEQ PO TBCR
40.0000 meq | EXTENDED_RELEASE_TABLET | Freq: Once | ORAL | Status: AC
Start: 2022-02-09 — End: 2022-02-09
  Administered 2022-02-09: 40 meq via ORAL
  Filled 2022-02-09: qty 2

## 2022-02-09 MED ORDER — LEVOTHYROXINE SODIUM 175 MCG PO TABS
175.0000 ug | ORAL_TABLET | ORAL | Status: DC
  Administered 2022-02-10 – 2022-02-16 (×6): 175 ug via ORAL
  Filled 2022-02-09 (×6): qty 1

## 2022-02-09 MED ORDER — GERHARDT'S BUTT CREAM
TOPICAL_CREAM | Freq: Every day | CUTANEOUS | Status: DC
  Administered 2022-02-11: 1 via TOPICAL
  Filled 2022-02-09: qty 1

## 2022-02-09 MED ORDER — PHYTONADIONE 5 MG PO TABS
2.5000 mg | ORAL_TABLET | Freq: Once | ORAL | Status: AC
  Administered 2022-02-09: 2.5 mg via ORAL
  Filled 2022-02-09: qty 1

## 2022-02-09 MED ORDER — ACETAMINOPHEN 325 MG PO TABS: 650.0000 mg | ORAL_TABLET | ORAL | Status: DC | PRN

## 2022-02-09 MED ORDER — CITALOPRAM HYDROBROMIDE 10 MG PO TABS
10.0000 mg | ORAL_TABLET | Freq: Every morning | ORAL | Status: DC
  Administered 2022-02-10 – 2022-02-16 (×7): 10 mg via ORAL
  Filled 2022-02-09 (×7): qty 1

## 2022-02-09 MED ORDER — LEVOTHYROXINE SODIUM 75 MCG PO TABS
87.5000 ug | ORAL_TABLET | ORAL | Status: DC
  Administered 2022-02-15: 87.5 ug via ORAL
  Filled 2022-02-09: qty 1

## 2022-02-09 MED ORDER — ASPIRIN 81 MG PO TBEC
81.0000 mg | DELAYED_RELEASE_TABLET | Freq: Every morning | ORAL | Status: DC
Start: 2022-02-10 — End: 2022-02-11
  Administered 2022-02-10 – 2022-02-11 (×2): 81 mg via ORAL
  Filled 2022-02-09 (×2): qty 1

## 2022-02-09 MED ORDER — PANTOPRAZOLE SODIUM 40 MG PO TBEC
40.0000 mg | DELAYED_RELEASE_TABLET | Freq: Every day | ORAL | Status: DC
  Administered 2022-02-10 – 2022-02-16 (×7): 40 mg via ORAL
  Filled 2022-02-09 (×7): qty 1

## 2022-02-09 NOTE — Progress Notes (Signed)
Hgb 8.8, K 2.7, LDH 450, and INR 5.5. Per Dr Haroldine Laws will schedule pt for RHC this week. Order received for K 80 meq today. Per Lauren PharmD will hold Coumadin until INR recheck on Thursday. Orders placed for RHC.   Called pt and [name] and discussed the above labs. Plan for K 80 meq today. [name] states they have K tablets at home. Plan to hold Coumadin until recheck on Thursday. Scheduled for [place] 02/12/22 @ 0900. Instructed to arrive at admissions at 0700, NPO past MN except sips with morning medication. Per Dr Haroldine Laws will plan to access via groin. They both verbalized understanding to all instructions.   Emerson Monte RN [place] Coordinator  Office: [phone]  24/7 Pager: [phone]

## 2022-02-09 NOTE — Progress Notes (Signed)
LVAD INR 

## 2022-02-09 NOTE — H&P (Addendum)
Advanced Heart Failure VAD History and Physical Note   PCP-Cardiologist: Dr. Haroldine Laws   Reason for Admission: Acute on Chronic RV Failure>>Cardiogenic Shock   HPI:    Johnny Oneill is a 81 y.o. 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    Presented to ER on 06/27/19 with syncopal episode and head injury from fall. ECG showed VT @ 240. Underwent emergent DC-CV in ER. Head CT no bleed. Admitted and had ICD gen replacement (EOL). -> Did not tolerate amio due to tremors.   Had ICD shock for VT on 06/09/20. Started on sotalol   On 02/06/21 Paced out of AFL.   Seen in ER 05/2021 due to LOW FLOW alarms. Found to be in VT rate 170. DCCV x 1 - returned to Johnny Oneill with frequent PVCs. Pacer settings adjusted by Johnny Kilts PA- DDD 90.   Johnny Oneill 11/22 to assess for RV failure RA = 5 RV = 33/5 PA = 33/13 (20) PCW = 10 Fick cardiac output/index = 4.0/2.1 Thermo CO/CI = 4.9/2.6 PVR = 2.0 WU Ao sat = 95% PA sat = 54%, 59% PAPI = 4.0   Admitted 12/01/21 for R TKA. Did well with surgery but complicated by severe orthostatic hypotension. Midodrine added     Admitted 12/18/21 with syncope. No events on device. Hydrated. Midodrine increased to 15 tid. Sotalol held   Admitted 12/28/21 with recurrent VT. Had DC-CV in ER. Sotalol restarted. Continued to have elevated MAPs as well as orthostasis. Midodrine decreased and florinef added.   Seen in VAD clinic today for sick visit. Remains very fatigued. Unable to do ADLs. Poor appetite. Mild LE edema. Labs c/w shock. AST 537, ALT 1,003, Alk Phos 136, Tbili 1.4, SCr 2.41 (BL 2.3), K 2.7, CO2 25, LDH 450, INR 5.5, Hgb 8.8, WBC 6.9.  He is being directed admitted for shock, due to suspected RV failure.     LVAD INTERROGATION:  HeartMate II LVAD:  Speed: 9600 Flow: 3.2 Power: 4.9 w   PI: 5.7    Review of Systems: [y] = yes, [ ]  = no   General: Weight gain [ ] ; Weight  loss [ ] ; Anorexia [ Y]; Fatigue [ Y]; Fever [ ] ; Chills [ ] ; Weakness [Y ]  Cardiac: Chest pain/pressure [ ] ; Resting SOB [ ] ; Exertional SOB [ ] ; Orthopnea [ ] ; Pedal Edema [ Y]; Palpitations [ ] ; Syncope [ ] ; Presyncope [ ] ; Paroxysmal nocturnal dyspnea[ ]   Pulmonary: Cough [ ] ; Wheezing[ ] ; Hemoptysis[ ] ; Sputum [ ] ; Snoring [ ]   GI: Vomiting[ ] ; Dysphagia[ ] ; Melena[ ] ; Hematochezia [ ] ; Heartburn[ ] ; Abdominal pain [ ] ; Constipation [ ] ; Diarrhea [ ] ; BRBPR [ ]   GU: Hematuria[ ] ; Dysuria [ ] ; Nocturia[ ]   Vascular: Pain in legs with walking [ ] ; Pain in feet with lying flat [ ] ; Non-healing sores [ ] ; Stroke [ ] ; TIA [ ] ; Slurred speech [ ] ;  Neuro: Headaches[ ] ; Vertigo[ ] ; Seizures[ ] ; Paresthesias[ ] ;Blurred vision [ ] ; Diplopia [ ] ; Vision changes [ ]   Ortho/Skin: Arthritis [ ] ; Joint pain [ ] ; Muscle pain [ ] ; Joint swelling [ ] ; Back Pain [ ] ; Rash [ ]   Psych: Depression[ ] ; Anxiety[ ]   Heme: Bleeding problems [ ] ; Clotting disorders [ ] ; Anemia [ ]   Endocrine: Diabetes [ ] ; Thyroid dysfunction[ ]     Home Medications Prior to Admission medications   Medication Sig Start  Date End Date Taking? Authorizing Provider  acetaminophen (TYLENOL) 500 MG tablet Take 1,000 mg by mouth every 6 (six) hours as needed (pain).    [provider]  aspirin EC 81 MG tablet Take 81 mg by mouth in the morning.    [provider]  Cholecalciferol (VITAMIN Oneill) 125 MCG (5000 UT) CAPS Take 5,000 Units by mouth in the morning.    [provider]  citalopram (CELEXA) 10 MG tablet Take 1 tablet by mouth once daily Patient taking differently: Take 10 mg by mouth every morning. 03/19/21   Johnny Oneill, Johnny Pascal, MD  Coenzyme Q10 (COQ10) 200 MG CAPS Take 200 mg by mouth in the morning.    [provider]  dronabinol (MARINOL) 2.5 MG capsule Take 1 capsule (2.5 mg total) by mouth daily before lunch. 02/09/22   Johnny Oneill, Johnny Pascal, MD  Famotidine (PEPCID PO) Take 1 tablet by mouth  every morning.    [provider]  fludrocortisone (FLORINEF) 0.1 MG tablet Take 1 tablet (0.1 mg total) by mouth daily. 01/03/22   Johnny Catching, PA-C  furosemide (LASIX) 20 MG tablet Take 1 tablet (20 mg total) by mouth daily as needed (Take 20 mg as daily as needed for wt > 153 lbs). Reported on 01/14/2016 Patient taking differently: Take 20 mg by mouth daily as needed (Take 20 mg as daily as needed for wt > 153 lbs). Reported on 01/14/2016 02/21/21   Johnny Friar, PA-C  melatonin 5 MG TABS Take 5 mg by mouth at bedtime.    [provider]  mexiletine (MEXITIL) 150 MG capsule Take 1 capsule by mouth twice daily 01/27/22   Connar Oneill, Johnny Pascal, MD  oxyCODONE (OXY IR/ROXICODONE) 5 MG immediate release tablet Take 1 tablet (5 mg total) by mouth every 12 (twelve) hours as needed for breakthrough pain. Patient not taking: Reported on 01/08/2022 12/12/21   Johnny Grinder D, NP  pantoprazole (PROTONIX) 40 MG tablet Take 1 tablet by mouth once daily Patient taking differently: 40 mg every morning. 08/11/21   Johnny Oneill, Johnny Pascal, MD  Probiotic Product (PROBIOTIC PO) Take 1 capsule by mouth in the morning.    [provider]  rosuvastatin (CRESTOR) 20 MG tablet Take 1 tablet (20 mg total) by mouth daily. Patient taking differently: Take 20 mg by mouth every morning. 11/26/21   Johnny Oneill, Johnny Pascal, MD  sotalol (BETAPACE) 80 MG tablet Take 1 tablet (80 mg total) by mouth daily. 01/03/22   Johnny Catching, PA-C  SYNTHROID 175 MCG tablet 1 tablet daily every morning except half tablet on Sundays Patient taking differently: Take 87.5-175 mcg by mouth See admin instructions. Take one tablet (175 mcg) by mouth before breakfast Monday thru Saturday, take 1/2 tablet (87.5 mcg) on Sundays before breakfast 10/08/21   Johnny Snare, MD  traMADol (ULTRAM) 50 MG tablet Take 1-2 tablets (50-100 mg total) by mouth every 6 (six) hours as needed for moderate pain. Patient not taking: Reported  on 02/09/2022 12/12/21   Johnny Grinder D, NP  traZODone (DESYREL) 50 MG tablet TAKE 1 TABLET BY MOUTH AT BEDTIME 02/03/22   Shamyia Grandpre, Johnny Pascal, MD  warfarin (COUMADIN) 5 MG tablet Take 7.5 mg 4/14 then 5 mg (1 tablet) every day as directed by HF Clinic Patient taking differently: Take 5 mg by mouth at bedtime. Or as directed by HF Clinic 12/12/21   Conrad Jacumba, NP    Past Medical History: Past Medical History:  Diagnosis Date   Anxiety  Automatic implantable cardioverter-defibrillator in situ    CAD (coronary artery disease)    S/P stenting of LCX in 2004;  s/p Lat MI 2009 with occlusion of the LCX - treated with Promus stenting. Has total occlusion of the RCA.    CHF (congestive heart failure) (HCC)    Hypercholesteremia    Hypertension    Hypothyroidism    ICD (implantable cardiac defibrillator) in place    PROPHYLACTIC      medtronic   Inferior MI ([place]) "? date"; 2009   Ischemic cardiomyopathy    a. 10/09 Echo: Sev LV Dysfxn, inf/lat AK, Mod MR.   Liver disease    LVAD (left ventricular assist device) present ([place])    Osteoarthritis    a. s/p R TKR   Pacemaker    PVC (premature ventricular contraction)    RBBB    Shortness of breath     Past Surgical History: Past Surgical History:  Procedure Laterality Date   CARPAL TUNNEL RELEASE Right 05/29/2016   Procedure: CARPAL TUNNEL RELEASE;  Surgeon: Earlie Server, MD;  Location: [place];  Service: Orthopedics;  Laterality: Right;   CORONARY ANGIOPLASTY WITH STENT PLACEMENT  2004   CORONARY ANGIOPLASTY WITH STENT PLACEMENT  07/2008   DEFIBRILLATOR  2009   ESOPHAGOGASTRODUODENOSCOPY N/A 09/15/2013   Procedure: ESOPHAGOGASTRODUODENOSCOPY (EGD);  Surgeon: Ladene Artist, MD;  Location[facility] ENDOSCOPY;  Service: Endoscopy;  Laterality: N/A;  bedside   HEMATOMA EVACUATION Right 11/06/2013   Procedure: EVACUATION HEMATOMA;  Surgeon: Gaye Pollack, MD;  Location: [place];  Service: Cardiothoracic;  Laterality: Right;   ICD GENERATOR CHANGEOUT  N/A 06/28/2019   Procedure: ICD GENERATOR CHANGEOUT;  Surgeon: Constance Haw, MD;  Location: [place] CV LAB;  Service: Cardiovascular;  Laterality: N/A;   INSERT / REPLACE / REMOVE PACEMAKER  2009   INSERTION OF IMPLANTABLE LEFT VENTRICULAR ASSIST DEVICE N/A 09/18/2013   Procedure: INSERTION OF IMPLANTABLE LEFT VENTRICULAR ASSIST DEVICE;  Surgeon: Ivin Poot, MD;  Location: [place];  Service: Open Heart Surgery;  Laterality: N/A;  CIRC ARREST  NITRIC OXIDE   INTRA-AORTIC BALLOON PUMP INSERTION N/A 09/14/2013   Procedure: INTRA-AORTIC BALLOON PUMP INSERTION;  Surgeon: Jolaine Artist, MD;  Location: [facility] CATH LAB;  Service: Cardiovascular;  Laterality: N/A;   INTRAOPERATIVE TRANSESOPHAGEAL ECHOCARDIOGRAM N/A 09/18/2013   Procedure: INTRAOPERATIVE TRANSESOPHAGEAL ECHOCARDIOGRAM;  Surgeon: Ivin Poot, MD;  Location: [place];  Service: Open Heart Surgery;  Laterality: N/A;   KNEE ARTHROSCOPY     bilaterally   KNEE ARTHROSCOPY W/ PARTIAL MEDIAL MENISCECTOMY  12/2002   left   LEFT VENTRICULAR ASSIST DEVICE     RIGHT HEART CATH N/A 07/28/2021   Procedure: RIGHT HEART CATH;  Surgeon: Jolaine Artist, MD;  Location: [place] CV LAB;  Service: Cardiovascular;  Laterality: N/A;   RIGHT HEART CATHETERIZATION N/A 08/25/2013   Procedure: RIGHT HEART CATH;  Surgeon: Jolaine Artist, MD;  Location: [facility] CATH LAB;  Service: Cardiovascular;  Laterality: N/A;   RIGHT HEART CATHETERIZATION N/A 09/13/2013   Procedure: RIGHT HEART CATH;  Surgeon: Jolaine Artist, MD;  Location: [facility] CATH LAB;  Service: Cardiovascular;  Laterality: N/A;   RIGHT HEART CATHETERIZATION N/A 02/08/2014   Procedure: RIGHT HEART CATH;  Surgeon: Jolaine Artist, MD;  Location: [facility] CATH LAB;  Service: Cardiovascular;  Laterality: N/A;   RIGHT HEART CATHETERIZATION N/A 02/12/2014   Procedure: RIGHT HEART CATH;  Surgeon: Jolaine Artist, MD;  Location: [facility] CATH LAB;  Service: Cardiovascular;  Laterality: N/A;   TOTAL  KNEE ARTHROPLASTY  11/27/11   left   TOTAL KNEE ARTHROPLASTY  11/27/2011   Procedure: TOTAL KNEE ARTHROPLASTY;  Surgeon: Yvette Rack., MD;  Location: [place];  Service: Orthopedics;  Laterality: Left;  left total knee arthroplasty   TOTAL KNEE ARTHROPLASTY Right 12/03/2021   Procedure: TOTAL KNEE ARTHROPLASTY;  Surgeon: Earlie Server, MD;  Location: [place];  Service: Orthopedics;  Laterality: Right;   VIDEO ASSISTED THORACOSCOPY Right 11/06/2013   Procedure: VIDEO ASSISTED THORACOSCOPY;  Surgeon: Gaye Pollack, MD;  Location: [facility] OR;  Service: Cardiothoracic;  Laterality: Right;    Family History: Family History  Problem Relation Age of Onset   Heart failure Father    Stroke Father    Coronary artery disease Mother    Heart disease Mother    Hyperlipidemia Sister     Social History: Social History   Socioeconomic History   Marital status: Married    Spouse name: Not on file   Number of children: 1   Years of education: Not on file   Highest education level: Not on file  Occupational History   Occupation: Music therapist: RETIRED   Occupation: Copywriter, advertising  Tobacco Use   Smoking status: Never   Smokeless tobacco: Never  Vaping Use   Vaping Use: Never used  Substance and Sexual Activity   Alcohol use: No   Drug use: No    Types: Marijuana    Comment: "back in our younger days we smoked a little pot"   Sexual activity: Not Currently  Other Topics Concern   Not on file  Social History Narrative   Not on file   Social Determinants of Health   Financial Resource Strain: Not on file  Food Insecurity: Not on file  Transportation Needs: Not on file  Physical Activity: Not on file  Stress: Not on file  Social Connections: Not on file    Allergies:  Allergies  Allergen Reactions   Ace Inhibitors     Hypotension and elevated creatinine   Amiodarone Other (See Comments)    Cerebellar side effects   Diovan [Valsartan] Other (See Comments)    Hypotension at low  dose, hyperkalemia   Lipitor [Atorvastatin Calcium] Other (See Comments)    Muscle pain   Jardiance [Empagliflozin] Other (See Comments)    Unsure of exact side effect   Norvasc [Amlodipine] Other (See Comments)    Put patient to sleep    Objective:    Vital Signs:   Temp: 97.4 Doppler Pressure: 156 Automatc BP: 131/106 (116)   HR: 84  SPO2: 100% on RA  Mean arterial Pressure 116  Physical Exam    General:  fatigued appearing. No resp difficulty HEENT: Normal Neck: supple. JVP elevated to jaw . Carotids 2+ bilat; no bruits. No lymphadenopathy or thyromegaly appreciated. Cor: Mechanical heart sounds with LVAD hum present. Lungs: Clear Abdomen: soft, nontender, nondistended. No hepatosplenomegaly. No bruits or masses. Good bowel sounds. Driveline: C/Oneill/I; securement device intact and driveline incorporated Extremities: no cyanosis, clubbing, rash, trace-1+ b/l LE edema, cool extremities  Neuro: alert & orientedx3, cranial nerves grossly intact. moves all 4 extremities w/o difficulty. Affect pleasant   Telemetry   N/A   EKG   12 lead pending   Labs    Basic Metabolic Panel: Recent Labs  Lab 02/09/22 0929  NA 139  K 2.7*  CL 105  CO2 25  GLUCOSE 121*  BUN 29*  CREATININE 2.41*  CALCIUM 8.7*  MG 1.9    Liver Function Tests: Recent Labs  Lab 02/09/22 0929  AST 537*  ALT 1,003*  ALKPHOS 136*  BILITOT 1.4*  PROT 6.6  ALBUMIN 3.4*   No results for input(s): "LIPASE", "AMYLASE" in the last 168 hours. No results for input(s): "AMMONIA" in the last 168 hours.  CBC: Recent Labs  Lab 02/03/22 1013 02/09/22 0929  WBC 5.2 6.9  HGB 8.2* 8.8*  HCT 26.6* 29.2*  MCV 95.0 97.3  PLT 210 230    Cardiac Enzymes: No results for input(s): "CKTOTAL", "CKMB", "CKMBINDEX", "TROPONINI" in the last 168 hours.  BNP: BNP (last 3 results) No results for input(s): "BNP" in the last 8760 hours.  ProBNP (last 3 results) No results for input(s): "PROBNP" in the  last 8760 hours.   CBG: No results for input(s): "GLUCAP" in the last 168 hours.  Coagulation Studies: Recent Labs    02/09/22 0929  LABPROT 49.2*  INR 5.5*    Other results: EKG: pending    Imaging    No results found.    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   - Echo 3/18 EF 10-15% mild AI. Mod RV dysfunction. LVAD cannula OK - has struggled with RV failure however RHC 11/22 looked good (despite evidence of RVF on LE dopplers showing reflux - Now NYHA IV. Has not bounced back after his knee surgery. + weakness & anorexia. Concern for RV failure. Labs c/w shock  - Admit and start on milrinone 0.25. Place PICC and follow co-ox and CVPs - Plan RHC this week  - Failed Jardiance due to volume depletion - He has not tolerated ACE-I or amlodipine or b-blocker in the past.  2) Weakness/anorexia - Suspect due to RV failure/ shock.  - Plan per above  - Will plan RHC this week to further evalaute   3) LVAD: HMII 08/2013 for DT. - VAD interrogated personally. Parameters stable. - Admitted in May 2018 with elevated LDH with bival.  - Continue aspirin and coumadin.  - LDH elevated INR high. Suspect RV failure. Hold coumadin and given Vit K  - DL site ok - MAPs elevated stop florinef   4) Orthostatic hypotension, post-op - Resolved. 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  - Off amio due to cerebellar side effects.  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 - VT  06/16/21 with  DC-CV x 1 - Recurrent VT 12/28/21 after stopping sotalol. Sotalol restarted. QT ok.  - PVCs improved on mexilitene. Continue mexilitene 150 bid - Keep K > 4.0 Mg > 2.0 - Continue to f/u with EP. Options limited    6) Atrial flutter, recurrent - patient paced out of AFL in 6/22. Atrial therapies enabled  - continue sotalol (on for VT). QTs slightly prolonged but stable - In SR today.    7) Chronic anticoagulation: INR goal 2.0-3.0.    - INR 5.5  Suspect RV failure  - Continue ASA 81 mg for LVAD  - Hold coumadin and give Vit K. Follow INR    8) HTN:  - MAPs elevated as above. Stop florinef   9) Hypothyroidism:  - Followed by  Dr Dwyane Dee. Stable.No change    10) Hyperlipidemia:  - Continue Crestor. Zetia stopped due to cost.  - No change.    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  No programming changes were made.  The LVAD is functioning within specified parameters.  The patient performs LVAD self-test daily.  LVAD interrogation was negative for any significant power changes, alarms or PI events/speed drops.  LVAD equipment check completed and is in good working order.  Back-up equipment present.   LVAD education done on emergency procedures and precautions and reviewed exit site care.  Length of Stay: 0  Lyda Jester, PA-C 02/09/2022, 5:06 PM  VAD Team Pager [phone] (7am - 7am) +++VAD ISSUES ONLY+++   Advanced Heart Failure Team Pager [phone] (M-F; [place])  Please contact [facility] for night-coverage after hours (5p -7a ) and weekends on [email or web address] for all non- LVAD Issues   Patient seen and examined with the above-signed Advanced Practice Provider and/or Housestaff. I personally reviewed laboratory data, imaging studies and relevant notes. I independently examined the patient and formulated the important aspects of  the plan. I have edited the note to reflect any of my changes or salient points. I have personally discussed the plan with the patient and/or family.  Patient seen earlier today in [facility] with progressive weakness. Concern for RV failure.   Labs back showed elevated LFTs, low K, high LDH and high INR suggestive of significant liver dysfunction and further concerning for RV failure and poor nutritional status.   Decision made to admit patient for IV inotropic support. On arrival patient with 18 beat run of NSVT.   VAD interrogated personally. Parameters stable except for some low flows yesterday.  General:  Weak appearing HEENT: normal  Neck: supple. JVP to jaw  Carotids 2+ bilat; no bruits. No lymphadenopathy or thryomegaly appreciated. Cor: LVAD hum. 2/6 TR Lungs: Clear. Abdomen: obese soft, nontender, non-distended. No hepatosplenomegaly. No bruits or masses. Good bowel sounds. Driveline site clean. Anchor in place.  Extremities: no cyanosis, clubbing, rash. Warm tr-1+ edema cool Neuro: alert & oriented x 3. No focal deficits. Moves all 4 without problem   Very tenuous situation. Needs inotropic support for RV failure. However K is low and having NSVT. Will supp K aggressively. Check mag. Start milrinone at 0.125. Give oral vit K.   Place PICC if able (had problems with right subclavian vein in past).   Will need eventual RHC.   Glori Bickers, MD  11:13 PM

## 2022-02-09 NOTE — Progress Notes (Signed)
VAD CLINIC NOTE   HPI:  Johnny Oneill is a 81 y.o. 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   Presented to ER on 06/27/19 with syncopal episode and head injury from fall. ECG showed VT @ 240. Underwent emergent DC-CV in ER. Head CT no bleed. Admitted and had ICD gen replacement (EOL). -> Did not tolerate amio due to tremors.  Had ICD shock for VT on 06/09/20. Started on sotalol  On 02/06/21 Paced out of AFL.  Seen in ER 05/2021 due to LOW FLOW alarms. Found to be in VT rate 170. DCCV x 1 - returned to [place] with frequent PVCs. Pacer settings adjusted by [name] PA- DDD 90.  [place] 11/22 to assess for RV failure RA = 5 RV = 33/5 PA = 33/13 (20) PCW = 10 Fick cardiac output/index = 4.0/2.1 Thermo CO/CI = 4.9/2.6 PVR = 2.0 WU Ao sat = 95% PA sat = 54%, 59% PAPI = 4.0  Admitted 12/01/21 for R TKA. Did well with surgery but complicated by severe orthostatic hypotension. Midodrine added    Admitted 12/18/21 with syncope. No events on device. Hydrated. Midodrine increased to 15 tid. Sotalol held  Admitted 12/28/21 with recurrent VT. Had DC-CV in ER. Sotalol restarted. Continued to have elevated MAPs as well as orthostasis. Midodrine decreased and florinef added.   Today he returns for repeat sick visit. Remains very fatigued. Unable to do ADLs. Poor appetite. Mild LE edema. Denies CP, orthopnea or PND.  No fevers, chills or problems with driveline. No bleeding, melena or neuro symptoms.  Taking all meds as prescribed.    VAD Indication: Destination therapy- patient choice     VAD interrogation & Equipment Management: Speed: 9600 Flow: 3.2 Power: 4.9 w    PI: 5.7   Alarms: multiple LOW FLOW alarms: 6/8- 5, 6/9- 28, 6/10- 11, 6/11- 4  Events: rare  Fixed speed 9600  Low speed limit: 9000   Primary Controller:  Replace back up battery in 28 months Back up controller:  Replace back up  battery in 26 months   Annual Equipment Maintenance on UBC/PM was performed 09/23/21   I reviewed the LVAD parameters from today and compared the results to the patient's prior recorded data. LVAD interrogation was NEGATIVE for significant power changes, NEGATIVE for clinical alarms and STABLE for PI events/speed drops. No programming changes were made and pump is functioning within specified parameters. Pt is performing daily controller and system monitor self tests along with completing weekly and monthly maintenance for LVAD equipment.    Past Medical History:  Diagnosis Date   Anxiety    Automatic implantable cardioverter-defibrillator in situ    CAD (coronary artery disease)    S/P stenting of LCX in 2004;  s/p Lat MI 2009 with occlusion of the LCX - treated with Promus stenting. Has total occlusion of the RCA.    CHF (congestive heart failure) (HCC)    Hypercholesteremia    Hypertension    Hypothyroidism    ICD (implantable cardiac defibrillator) in place    PROPHYLACTIC      medtronic   Inferior MI ([place]) "? date"; 2009   Ischemic cardiomyopathy    a. 10/09 Echo: Sev LV Dysfxn, inf/lat AK, Mod MR.   Liver disease    LVAD (left ventricular assist device) present [facility])    Osteoarthritis    a. s/p R TKR   Pacemaker  PVC (premature ventricular contraction)    RBBB    Shortness of breath     Current Outpatient Medications  Medication Sig Dispense Refill   acetaminophen (TYLENOL) 500 MG tablet Take 1,000 mg by mouth every 6 (six) hours as needed (pain).     aspirin EC 81 MG tablet Take 81 mg by mouth in the morning.     Cholecalciferol (VITAMIN D) 125 MCG (5000 UT) CAPS Take 5,000 Units by mouth in the morning.     citalopram (CELEXA) 10 MG tablet Take 1 tablet by mouth once daily (Patient taking differently: Take 10 mg by mouth every morning.) 90 tablet 3   Coenzyme Q10 (COQ10) 200 MG CAPS Take 200 mg by mouth in the morning.     Famotidine (PEPCID PO) Take 1 tablet by mouth  every morning.     fludrocortisone (FLORINEF) 0.1 MG tablet Take 1 tablet (0.1 mg total) by mouth daily. 30 tablet 1   furosemide (LASIX) 20 MG tablet Take 1 tablet (20 mg total) by mouth daily as needed (Take 20 mg as daily as needed for wt > 153 lbs). Reported on 01/14/2016 (Patient taking differently: Take 20 mg by mouth daily as needed (Take 20 mg as daily as needed for wt > 153 lbs). Reported on 01/14/2016) 30 tablet 6   melatonin 5 MG TABS Take 5 mg by mouth at bedtime.     mexiletine (MEXITIL) 150 MG capsule Take 1 capsule by mouth twice daily 60 capsule 6   pantoprazole (PROTONIX) 40 MG tablet Take 1 tablet by mouth once daily (Patient taking differently: 40 mg every morning.) 90 tablet 3   Probiotic Product (PROBIOTIC PO) Take 1 capsule by mouth in the morning.     rosuvastatin (CRESTOR) 20 MG tablet Take 1 tablet (20 mg total) by mouth daily. (Patient taking differently: Take 20 mg by mouth every morning.) 90 tablet 3   sotalol (BETAPACE) 80 MG tablet Take 1 tablet (80 mg total) by mouth daily. 30 tablet 1   SYNTHROID 175 MCG tablet 1 tablet daily every morning except half tablet on Sundays (Patient taking differently: Take 87.5-175 mcg by mouth See admin instructions. Take one tablet (175 mcg) by mouth before breakfast Monday thru Saturday, take 1/2 tablet (87.5 mcg) on Sundays before breakfast) 30 tablet 3   traZODone (DESYREL) 50 MG tablet TAKE 1 TABLET BY MOUTH AT BEDTIME 30 tablet 6   warfarin (COUMADIN) 5 MG tablet Take 7.5 mg 4/14 then 5 mg (1 tablet) every day as directed by HF Clinic (Patient taking differently: Take 5 mg by mouth at bedtime. Or as directed by HF Clinic) 135 tablet 11   dronabinol (MARINOL) 2.5 MG capsule Take 1 capsule (2.5 mg total) by mouth daily before lunch. 30 capsule 5   oxyCODONE (OXY IR/ROXICODONE) 5 MG immediate release tablet Take 1 tablet (5 mg total) by mouth every 12 (twelve) hours as needed for breakthrough pain. (Patient not taking: Reported on  01/08/2022) 30 tablet 0   traMADol (ULTRAM) 50 MG tablet Take 1-2 tablets (50-100 mg total) by mouth every 6 (six) hours as needed for moderate pain. (Patient not taking: Reported on 02/09/2022) 40 tablet 5   No current facility-administered medications for this encounter.   Facility-Administered Medications Ordered in Other Encounters  Medication Dose Route Frequency Provider Last Rate Last Admin   etomidate (AMIDATE) injection    Anesthesia Intra-op Myna Bright, CRNA   20 mg at 02/08/14 0915   rocuronium [facility]) injection  Anesthesia Intra-op Myna Bright, CRNA   50 mg at 02/08/14 0932   succinylcholine (ANECTINE) injection    Anesthesia Intra-op Myna Bright, CRNA   100 mg at 02/08/14 0915   Ace inhibitors, Amiodarone, Diovan [valsartan], Lipitor [atorvastatin calcium], Jardiance [empagliflozin], and Norvasc [amlodipine]  Review of systems complete and found to be negative unless listed in HPI.   Vitals:   02/09/22 0922 02/09/22 0924  BP: (!) 156/0 (!) 131/106  Pulse: 84   SpO2: 100%    Wt Readings from Last 3 Encounters:  02/09/22 73.2 kg (161 lb 6.4 oz)  01/28/22 73 kg (161 lb)  01/14/22 70.4 kg (155 lb 3.2 oz)    Vital Signs:   Temp: 97.4 Doppler Pressure: 156 Automatc BP: 131/106 (116)  HR: 84  SPO2: 100% on RA   Weight: 161.4 lb w/ eqt Last weight: 155.2 lb w/eqt  Physical Exam: General:  Weak appearing. No resp difficulty HEENT: normal  Neck: supple. JVP to jaw.  Carotids 2+ bilat; no bruits. No lymphadenopathy or thryomegaly appreciated. Cor: LVAD hum.  Lungs: Clear. Abdomen: obese soft, nontender, non-distended. No hepatosplenomegaly. No bruits or masses. Good bowel sounds. Driveline site clean. Anchor in place.  Extremities: no cyanosis, clubbing, rash. Warm tr-1+ edema  cool Neuro: alert & oriented x 3. No focal deficits. Moves all 4 without problem    ASSESSMENT AND PLAN:   1) Weakness/anorexia - I am concerned for RV failure. Will  plan RHC this week to further evalaute  2) Chronic systolic HF: ICM, EF 24-09% s/p LVAD HM II implant 08/2013 for DT.   - Echo 3/18 EF 10-15% mild AI. Mod RV dysfunction. LVAD cannula OK - has struggled with RV failure however RHC 11/22 looked good (despite evidence of RVF on LE dopplers showing reflux - Now NYHA IV. Has not bounced back after his knee surgery. Concern for RV failure - Plan RHC this week  - Failed Jardiance due to volume depletion - He has not tolerated ACE-I or amlodipine or b-blocker in the past.  3) LVAD: HMII 08/2013 for DT. - VAD interrogated personally. Parameters stable. - Admitted in May 2018 with elevated LDH with bival.  - Continue aspirin and coumadin.  - LDH elevated INR high. Suspect RV failure - DL site ok - MAPs elevated stop florinef  4) Orthostatic hypotension, post-op - Resolved. Stop florinef  5) VT/Frequent PVCs - now s/p ICD gen change.  - Off amio due to cerebellar side effects.  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 - VT  06/16/21 with  DC-CV x 1 - Recurrent VT 12/28/21 after stopping sotalol. Sotalol restarted. QT ok.  - PVCs improved on mexilitene. Continue mexilitene 150 bid - Keep K > 4.0 Mg > 2.0 - Continue to f/u with EP. Options limited  6) Atrial flutter, recurrent - patient paced out of AFL in 6/22. Atrial therapies enabled  - continue sotalol (on for VT). QTs slightly prolonged but stable - In SR today.   7) Chronic anticoagulation: INR goal 2.0-3.0.    - INR 5.5  Suspect RV failure  - Continue ASA 81 mg for LVAD + warfarin.   8) HTN:  - MAPs elevated as above. Stop florinef   9) Hypothyroidism:  - Followed by Dr Dwyane Dee. Stable.No change   10) Hyperlipidemia:  - Continue Crestor. Zetia stopped due to cost.  - No change.   11) CAD - no s/s ischemia - continue Crestor  12) CKD IV - Creatinine baseline 1.7-2.0 - Check BMET  -  failed Jardiance - Has Nephrology referral  13) Anemia  - Hgb 8.8   RHC this week  Total time spent  45 minutes. Over half that time spent discussing above.    Glori Bickers, MD  11:32 AM   Addendum: Full labs back with markedly elevated LFTs. Suspect severe RV failure. Will admit. Start empiric milrinone. Give vitamin K.   Glori Bickers, MD  5:12 PM

## 2022-02-09 NOTE — Progress Notes (Signed)
Patient presents for 3 week follow up visit in [facility] today with his wife Johnny Oneill. Reports no problems with VAD equipment or concerns with drive line.   Pt arrived to clinic using a cane today. States that he feels weak all the time. Denies lightheadedness, dizziness, falls, shortness of breath, and signs of bleeding. He completed outpatient physical therapy last week.   Pt states that he had vomited/coughed up large amounts of mucous on Friday and Saturday. Reports lack of appetite despite taking CBD gummies. Discussed with Dr Haroldine Laws. Order received for Marinol 2.5 mg daily for appetite stimulation. Provided with GoodRx card for both [facility] and [facility]. Provided with paper prescription for them to bring to pharmacy of choice.    Johnny Oneill called clinic on Friday reporting lower extremity swelling and shortness of breath. Pt instructed to take Lasix 20 mg x 1.  Reports swelling and shortness of breath improved with Lasix. Multiple asymptomatic LOW FLOWs noted 6/8 thru 6/11 following Lasix administration. Pt reports he is not drinking enough each day.    BP elevated today. See documentation below. Per Dr Haroldine Laws will stop Florinef today. Due to pt's continued failure to thrive will plan on right heart cath later this week to assess RV failure. Orders placed and cath scheduled for 02/12/22 @ 0900. Plan and appt information discussed with pt and his wife Johnny Oneill. See separate note for documentation.   INR 5.5 today. Discussed with Lauren PharmD. Will have pt hold Coumadin until recheck on 02/12/22 when he comes for RHC. K 2.7. Per Dr Haroldine Laws pt to take K 80 meq today. Johnny Oneill states pt has K tablets at home. LDH 450. Will plan to repeat on 02/12/22.   Vital Signs:   Temp: 97.4 Doppler Pressure: 156 Automatc BP: 131/106 (116)  HR: 84  SPO2: 100% on RA   Weight: 161.4 lb w/ eqt Last weight: 162.2 lb w/eqt  VAD Indication: Destination therapy- patient choice     VAD interrogation & Equipment  Management: Speed: 9600 Flow: 3.2 Power: 4.9 w    PI: 5.7   Alarms: multiple LOW FLOW alarms: 6/8- 5, 6/9- 28, 6/10- 11, 6/11- 4 (thought to be due to poor PO intake & Lasix on Friday) Events: rare  Fixed speed 9600  Low speed limit: 9000   Primary Controller:  Replace back up battery in 28 months Back up controller:  Replace back up battery in 26 months   Annual Equipment Maintenance on UBC/PM was performed 09/23/21   I reviewed the LVAD parameters from today and compared the results to the patient's prior recorded data. LVAD interrogation was NEGATIVE for significant power changes, NEGATIVE for clinical alarms and STABLE for PI events/speed drops. No programming changes were made and pump is functioning within specified parameters. Pt is performing daily controller and system monitor self tests along with completing weekly and monthly maintenance for LVAD equipment.  Exit Site Care: Drive line is being maintained weekly by Johnny Oneill, his wife. Existing VAD dressing C/D/I Sorbaview dressing and Silverlon patch intact. Exit site healed and incorporated, the velour is fully implanted at exit site. Pt denies fever or chills. Pt has adequate supplies at home.   Significant Events on VAD Support:  10/2013>SBO 12/2016> elevated LDH, admit for Bival   Device: Dual Medtronic Therapies:  on 231  Pacing: AAI<=>DDD 80 bpm  Last check:  06/26/21  BP & Labs:  Doppler 156 - Doppler is reflecting modified systolic  Hgb 8.8 - No S/S of bleeding. Specifically denies melena/BRBPR  or nosebleeds.   LDH 450 - established baseline of 200- 350. Denies tea-colored urine. No power elevations noted on interrogation.    Patient Instructions:  Stop Florinef Start Marinol 2.5 mg daily before a meal to stimulate appetite Coumadin dosing per Lauren PharmD Return to [place] clinic in 1 month  Addendum: INR 5.5 today. Discussed with Lauren PharmD. Will have pt hold Coumadin until recheck on 02/12/22 when he comes  for RHC. K 2.7. Per Dr Haroldine Laws pt to take K 80 meq today. Johnny Oneill states pt has K tablets at home. LDH 450. Will plan to repeat on 02/12/22.   Johnny Monte RN [place] Coordinator  Office: [phone]  24/7 Pager: [phone]

## 2022-02-09 NOTE — Patient Instructions (Addendum)
Stop Florinef Start Marinol 2.5 mg daily before a meal to stimulate appetite Coumadin dosing per Lauren PharmD Return to [place] clinic in 1 month

## 2022-02-10 DIAGNOSIS — I50813 Acute on chronic right heart failure: Secondary | ICD-10-CM | POA: Diagnosis not present

## 2022-02-10 DIAGNOSIS — E440 Moderate protein-calorie malnutrition: Secondary | ICD-10-CM | POA: Insufficient documentation

## 2022-02-10 LAB — COMPREHENSIVE METABOLIC PANEL
ALT: 760 U/L — ABNORMAL HIGH (ref 0–44)
AST: 227 U/L — ABNORMAL HIGH (ref 15–41)
Albumin: 3.2 g/dL — ABNORMAL LOW (ref 3.5–5.0)
Alkaline Phosphatase: 124 U/L (ref 38–126)
Anion gap: 11 (ref 5–15)
BUN: 31 mg/dL — ABNORMAL HIGH (ref 8–23)
CO2: 25 mmol/L (ref 22–32)
Calcium: 9.3 mg/dL (ref 8.9–10.3)
Chloride: 104 mmol/L (ref 98–111)
Creatinine, Ser: 2.37 mg/dL — ABNORMAL HIGH (ref 0.61–1.24)
GFR, Estimated: 27 mL/min — ABNORMAL LOW (ref >60)
Glucose, Bld: 116 mg/dL — ABNORMAL HIGH (ref 70–99)
Potassium: 4.5 mmol/L (ref 3.5–5.1)
Sodium: 140 mmol/L (ref 135–145)
Total Bilirubin: 1.4 mg/dL — ABNORMAL HIGH (ref 0.3–1.2)
Total Protein: 6.3 g/dL — ABNORMAL LOW (ref 6.5–8.1)

## 2022-02-10 LAB — CBC
HCT: 27.2 % — ABNORMAL LOW (ref 39.0–52.0)
Hemoglobin: 8.5 g/dL — ABNORMAL LOW (ref 13.0–17.0)
MCH: 30 pg (ref 26.0–34.0)
MCHC: 31.3 g/dL (ref 30.0–36.0)
MCV: 96.1 fL (ref 80.0–100.0)
Platelets: 202 10*3/uL (ref 150–400)
RBC: 2.83 MIL/uL — ABNORMAL LOW (ref 4.22–5.81)
RDW: 19.1 % — ABNORMAL HIGH (ref 11.5–15.5)
WBC: 6.3 10*3/uL (ref 4.0–10.5)
nRBC: 0.6 % — ABNORMAL HIGH (ref 0.0–0.2)

## 2022-02-10 LAB — COOXEMETRY PANEL
Carboxyhemoglobin: <0.3 % — ABNORMAL LOW (ref 0.5–1.5)
Methemoglobin: <0.7 % (ref 0.0–1.5)
O2 Saturation: 17.1 %
Total hemoglobin: 8.8 g/dL — ABNORMAL LOW (ref 12.0–16.0)

## 2022-02-10 LAB — POTASSIUM: Potassium: 4 mmol/L (ref 3.5–5.1)

## 2022-02-10 LAB — PROTIME-INR
INR: 3.6 — ABNORMAL HIGH (ref 0.8–1.2)
Prothrombin Time: 35.3 seconds — ABNORMAL HIGH (ref 11.4–15.2)

## 2022-02-10 LAB — MAGNESIUM: Magnesium: 1.9 mg/dL (ref 1.7–2.4)

## 2022-02-10 LAB — LACTATE DEHYDROGENASE: LDH: 287 U/L — ABNORMAL HIGH (ref 98–192)

## 2022-02-10 LAB — T4: T4, Total: 7.5 ug/dL (ref 4.5–12.0)

## 2022-02-10 LAB — MRSA NEXT GEN BY PCR, NASAL: MRSA by PCR Next Gen: NOT DETECTED

## 2022-02-10 MED ORDER — FUROSEMIDE 10 MG/ML IJ SOLN
40.0000 mg | Freq: Once | INTRAMUSCULAR | Status: AC
Start: 2022-02-10 — End: 2022-02-10
  Administered 2022-02-10: 40 mg via INTRAVENOUS
  Filled 2022-02-10: qty 4

## 2022-02-10 MED ORDER — BOOST / RESOURCE BREEZE PO LIQD CUSTOM
1.0000 | Freq: Two times a day (BID) | ORAL | Status: DC
Start: 2022-02-10 — End: 2022-02-10

## 2022-02-10 MED ORDER — HYDRALAZINE HCL 10 MG PO TABS
10.0000 mg | ORAL_TABLET | Freq: Three times a day (TID) | ORAL | Status: DC
  Administered 2022-02-10: 10 mg via ORAL
  Filled 2022-02-10: qty 1

## 2022-02-10 MED ORDER — ENSURE ENLIVE PO LIQD
237.0000 mL | Freq: Two times a day (BID) | ORAL | Status: DC
  Administered 2022-02-11 – 2022-02-15 (×5): 237 mL via ORAL

## 2022-02-10 MED ORDER — ADULT MULTIVITAMIN W/MINERALS CH
1.0000 | ORAL_TABLET | Freq: Every day | ORAL | Status: DC
  Administered 2022-02-11 – 2022-02-16 (×6): 1 via ORAL
  Filled 2022-02-10 (×6): qty 1

## 2022-02-10 NOTE — Progress Notes (Addendum)
Advanced Heart Failure VAD Team Note  PCP-Cardiologist: Dr. Haroldine Laws   Subjective:     6/12: Direct admit for inotropic support given concern for RV failure and shock liver. Started on milrinone. Given PO Vit K (INR 5.5)  6/12: 18 beat run of NSVT (K 2.7, Mg 1.9). Several low flow alarms on VAD   On empiric milrinone 0.125. Awaiting PICC line.  LFTs improving AST 537>>227 ALT 1003>>760   INR 5.5>>3.6. Hgb 8.5. No gross bleeding  LDH 450>>287   SCr 2.41>>2.37   K improved, 2.7>>4.0  Mg 1.9   MAPs remain elevated 110s. Off Florinef. No further NSVT. No further low flow alarms  Feels a "tad" bit better today. Denies dyspnea. Ate most of his breakfast this morning.    LVAD INTERROGATION:  HeartMate II LVAD:   Flow 3.4 liters/min, speed 9600, power 5.0, PI 6.3.  No PI events. No low flow alarms today   Objective:    Vital Signs:   Temp:  [97.4 F (36.3 C)-97.5 F (36.4 C)] 97.5 F (36.4 C) (06/13 0500) Pulse Rate:  [78-80] 78 (06/13 0500) Resp:  [15-20] 20 (06/13 0500) BP: (121-129)/(106-111) 126/109 (06/13 0500) SpO2:  [94 %-100 %] 94 % (06/13 0500) Weight:  [71.7 kg-73.2 kg] 71.7 kg (06/13 0500) Last BM Date : 02/09/22 Mean arterial Pressure 110s  Intake/Output:   Intake/Output Summary (Last 24 hours) at 02/10/2022 0801 Last data filed at 02/10/2022 0500 Gross per 24 hour  Intake 423.11 ml  Output 425 ml  Net -1.89 ml     Physical Exam    General:  fatigued appearing. No resp difficulty HEENT: normal Neck: supple. JVP elevated to jaw . Carotids 2+ bilat; no bruits. No lymphadenopathy or thyromegaly appreciated. Cor: Mechanical heart sounds with LVAD hum present. Lungs: clear Abdomen: soft, nontender, nondistended. No hepatosplenomegaly. No bruits or masses. Good bowel sounds. Driveline: C/D/I; securement device intact and driveline incorporated Extremities: no cyanosis, clubbing, rash, edema Neuro: alert & orientedx3, cranial nerves grossly intact.  moves all 4 extremities w/o difficulty. Affect pleasant   Telemetry   A-V paced 80s. No further NSVT   EKG    No new EKG to review   Labs   Basic Metabolic Panel: Recent Labs  Lab 02/09/22 0929 02/10/22 0258 02/10/22 0612  NA 139  --  140  K 2.7* 4.0 4.5  CL 105  --  104  CO2 25  --  25  GLUCOSE 121*  --  116*  BUN 29*  --  31*  CREATININE 2.41*  --  2.37*  CALCIUM 8.7*  --  9.3  MG 1.9 1.9  --     Liver Function Tests: Recent Labs  Lab 02/09/22 0929 02/10/22 0612  AST 537* 227*  ALT 1,003* 760*  ALKPHOS 136* 124  BILITOT 1.4* 1.4*  PROT 6.6 6.3*  ALBUMIN 3.4* 3.2*   No results for input(s): "LIPASE", "AMYLASE" in the last 168 hours. No results for input(s): "AMMONIA" in the last 168 hours.  CBC: Recent Labs  Lab 02/03/22 1013 02/09/22 0929 02/10/22 0612  WBC 5.2 6.9 6.3  HGB 8.2* 8.8* 8.5*  HCT 26.6* 29.2* 27.2*  MCV 95.0 97.3 96.1  PLT 210 230 202    INR: Recent Labs  Lab 02/03/22 1013 02/09/22 0929 02/10/22 0612  INR 2.8* 5.5* 3.6*    Other results: EKG:    Imaging   DG Chest Port 1 View  Result Date: 02/09/2022 CLINICAL DATA:  Heart failure EXAM: PORTABLE CHEST 1 VIEW  COMPARISON:  11/24/2021, 04/20/2021 FINDINGS: Post sternotomy changes. Left-sided pacing device as before. Similar orientation of LVAD. Cardiomegaly without edema, pleural effusion, airspace disease or pneumothorax. IMPRESSION: No active disease.  Cardiomegaly. Electronically Signed   By: Donavan Foil M.D.   On: 02/09/2022 19:42   [place] EKG SITE RITE  Result Date: 02/09/2022 If Site Rite image not attached, placement could not be confirmed due to current cardiac rhythm.    Medications:     Scheduled Medications:  aspirin EC  81 mg Oral q AM   citalopram  10 mg Oral q morning   Gerhardt's butt cream   Topical Daily   levothyroxine  175 mcg Oral Once per day on Mon Tue Wed Thu Fri Sat   [START ON 02/15/2022] levothyroxine  87.5 mcg Oral Once per day on Sun    mexiletine  150 mg Oral BID   pantoprazole  40 mg Oral Daily   Ensure Max Protein  11 oz Oral Daily   sotalol  80 mg Oral Daily    Infusions:  sodium chloride 10 mL/hr at 02/10/22 0300   milrinone 0.125 mcg/kg/min (02/10/22 0300)    PRN Medications: sodium chloride, acetaminophen, melatonin   Patient Profile   81 y/o male w/ CAD, 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   - Echo 3/18 EF 10-15% mild AI. Mod RV dysfunction. LVAD cannula OK - Echo 4/23 EF 20-25%, RV mod reduced  - has struggled with RV failure however RHC 11/22 looked good (despite evidence of RVF on LE dopplers showing reflux - Now NYHA IV. Has not bounced back after his knee surgery. + weakness & anorexia. Concern for RV failure. Labs c/w shock  - On empiric milrinone 0.125. Awaiting PICC placement. LFTs and SCr improving  - Plan RHC this week  - Failed Jardiance due to volume depletion - He has not tolerated ACE-I or amlodipine or b-blocker in the past. - No diuretics yet. Set up CVP monitoring after PICC placement    2) Weakness/anorexia - Suspect due to RV failure/ shock.  - Plan per above  - Will plan RHC this week to further evalaute   3) LVAD: HMII 08/2013 for DT. - VAD interrogated personally. Parameters stable. - Admitted in May 2018 with elevated LDH with bival.  - LDH elevated INR high on admit. Suspect RV failure.  - INR and LDH now down-trending - Holding Coumadin. Continue ASA  - DL site ok - MAPs elevated stop florinef   4) Orthostatic hypotension, post-op - Resolved. 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  - Off amio due to cerebellar side effects.  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 - VT  06/16/21 with  DC-CV x 1 -  Recurrent VT 12/28/21 after stopping sotalol. Sotalol restarted. QT ok.  - PVCs improved on mexilitene. Continue mexilitene 150 bid - Keep K > 4.0 Mg > 2.0 - Continue to f/u with EP. Options limited   6) Atrial flutter, recurrent - patient paced out of AFL in 6/22. Atrial therapies enabled  - continue sotalol (on for VT). QTs slightly prolonged but stable - NSR today    7) Chronic anticoagulation: INR goal 2.0-3.0.    -  INR 5.5 on admit.  Suspect RV failure  - INR down w/ Vit K, 3.6 today  - Hold coumadin. Give Ensure - Continue ASA 81 mg for LVAD   8) HTN:  - MAPs elevated 110s  - Florinef discontinued. Long half-life.  - Milrinone per above  - Flornief has long half life. Continue to monitor MAPs. If remains elevated tomorrow, can consider cautious addition of low dose hydralazine (h/o orthostatic hypotension)    9) Hypothyroidism:  - on levothyroxine. TFTs ok  - Followed by Dr Dwyane Dee.    10) Hyperlipidemia:  - Continue Crestor. Zetia stopped due to cost.  - No change.    11) CAD - no s/s ischemia - continue Crestor   12) AKI on CKD IV - Creatinine baseline 1.7-2.0 - SCr 2.4, in setting of low output/cardiorenal  - Milrinone support per above   - failed Jardiance - Has Nephrology referral   13) Chronic Anemia  - Hgb 8.5 (stable)    14) Shock Liver - Suspect 2/2 RV failure  - Improving w/ milrinone  - AST 537>>227 - ALT 1003>>760  - follow CMP     I reviewed the LVAD parameters from today, and compared the results to the patient's prior recorded data.  No programming changes were made.  The LVAD is functioning within specified parameters.  The patient performs LVAD self-test daily.  LVAD interrogation was negative for any significant power changes, alarms or PI events/speed drops.  LVAD equipment check completed and is in good working order.  Back-up equipment present.   LVAD education done on emergency procedures and precautions and reviewed exit site  care.  Length of Stay: 1  Nelida Gores 02/10/2022, 8:01 AM  VAD Team --- VAD ISSUES ONLY--- Pager [phone] (7am - 7am)  Advanced Heart Failure Team  Pager [phone] (M-F; 7a - 5p)  Please contact [facility] for night-coverage after hours (5p -7a ) and weekends on [email or web address]   Patient seen and examined with the above-signed Advanced Practice Provider and/or Housestaff. I personally reviewed laboratory data, imaging studies and relevant notes. I independently examined the patient and formulated the important aspects of the plan. I have edited the note to reflect any of my changes or salient points. I have personally discussed the plan with the patient and/or family.  Now on milrinone 0.125. Feeling better. LFTs improving. MAPs remains high. K improved. No further VT today.   General:  NAD.  HEENT: normal  Neck: supple. JVP to jaw   Carotids 2+ bilat; no bruits. No lymphadenopathy or thryomegaly appreciated. Cor: LVAD hum.  2/6 TR Lungs: Clear. Abdomen: obese soft, nontender, non-distended. No hepatosplenomegaly. No bruits or masses. Good bowel sounds. Driveline site clean. Anchor in place.  Extremities: no cyanosis, clubbing, rash. Warm 1-2+ edema  Neuro: alert & oriented x 3. No focal deficits. Moves all 4 without problem   Severe RV failure. Now improved with milrinone. Will continue. Will give one dose lasix today. Will also give hydralazine for elevated MAP. INR coming down. RHC when INR ~2.0. Watch closely for VT on milrinone. VAD interrogated personally. Parameters stable.  Glori Bickers, MD  9:19 AM

## 2022-02-10 NOTE — Progress Notes (Signed)
LVAD Coordinator Rounding Note:  Direct admit 02/09/22 from home following clinic visit.  Pt admitted with RV failure and shock liver.  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 Darcey [name] under bridge to transplant criteria. Patient was removed from transplant list 05/2016 per his choice. He is currently Destination Therapy LVAD.   Pt was seen in clinic yesterday for sick visit due to FTT. Labs resulted with liver enzymes over 1000. Pt admitted from home placed on Milrinone 0.125 mcg/kg/min. INR 3.6 - holding coumadin. Pt will need RHC this admission when INR < 2.0  Pt had 18 beat run of VT last night, K was 2.7 on admit - K corrected last night - 4.0 today.  Pt is discouraged this morning. Pt offered emotional support. Will have Kennyth Lose our LCSW see pt today.   Pt tells me that [name] is coming today and will do his driveline dressing change.   Vital signs       Temp: 97.5 HR: 80   Doppler Pressure: 130     Automatic BP: 126/109 (117)    O2 Sat: 94% on RA Wt: 158 lbs  LVAD interrogation reveals:  Speed: 9600    Flow: 3.0    Power: 4.8 w   PI: 5.8      Alarms: 10 - 20 LOW FLOWS daily on 6/12 and 6/11 Events: none  Fixed speed:  9600 Low speed limit: 9000  Drive Line:  Drive line is being maintained weekly by [name], his wife. Existing VAD dressing clean, dry, and intact. Drive line anchor correctly applied. Weekly dressing changes per [facility] nurse or caregiver. Next dressing change due today. [name] will change dressing today.   Labs:  LDH trend: 450>287  INR trend: 5.5>3.6  AST/ALT: 537/1003>227/760  Anticoagulation Plan: -INR Goal:  2.5 - 3.0 - Currently holding -ASA Dose: 81 mg daily   Device: Medtronic dual - Therapies: on 231 bpm - Pacing:  AAI<=>DDD 80 bpm - Last check: in ED per Med[employer] rep on 12/18/21 - rate response turned on   Plan/Recommendations:  Call VAD Coordinator with any VAD equipment or drive line issues. Weekly dressing changes per [facility] nurse or  caregiver.   Tanda Rockers RN [place] Coordinator  Office: [phone]  24/7 Pager: [phone]

## 2022-02-10 NOTE — Progress Notes (Signed)
Initial Nutrition Assessment  DOCUMENTATION CODES:   Non-severe (moderate) malnutrition in context of chronic illness  INTERVENTION:   - Liberalize diet to Regular to promote PO intake  - Ensure Enlive po BID, each supplement provides 350 kcal and 20 grams of protein  - MVI with minerals daily  - Encourage PO intake  NUTRITION DIAGNOSIS:   Moderate Malnutrition related to chronic illness (CHF, CKD IV) as evidenced by moderate fat depletion, moderate muscle depletion.  GOAL:   Patient will meet greater than or equal to 90% of their needs  MONITOR:   PO intake, Supplement acceptance, Weight trends, I & O's  REASON FOR ASSESSMENT:   Malnutrition Screening Tool    ASSESSMENT:   81 year old male who was a direct admit on 6/12 from [facility]. 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 Pt admitted with acute on chronic RV failure, cardiogenic shock, shock liver.  Noted plan for RHC this admission when INR <2.0.  Spoke with pt at bedside. RD is familiar with pt from recent previous admission. Pt endorses poor appetite for about 2 weeks PTA. Pt reports that his appetite has improved a little bit since being in the hospital. PO intakes documented as 90%.  Pt reports typically eating 2 main meals daily (lunch and dinner) and a "snack" for breakfast that may include a pack of crackers and a small Dr. Malachi Bonds. Lunch and dinner are more balanced meals with protein, starch, and vegetables. Pt reports that his wife fixes him "five-course meals" but that he just hasn't been able to eat as much lately. He endorses consuming items like ice cream, ice cream, sandwiches, and Cookout milkshakes in an attempt to optimize calorie and protein intake.  Pt reports that he used to weigh 164 lbs but now his UBW is about 154 lbs. He states that he was never able to gain weight back up to previous UBW of 164 lbs. Current weight is 158 lbs.  Reviewed weight history in  chart. Weight has trended down over the last 8 months. Overall, pt has lost 7.7 kg since 06/19/21. This is a 9.7% weight loss in less than 8 months which is not clinically significant for timeframe but is concerning given pt with malnutrition and decreased PO intake.  Pt willing to consume Ensure during admission. He states that he had one yesterday. He would like to try starting with 2 Ensure supplements daily and increasing to 3 if he is able. RD will also liberalize diet to Regular to provide the most options at mealtimes.  Admit weight: 73.2 kg Current weight: 71.7 kg  Meal Completion: 90% x 2 documented meals  Medications reviewed and include: Boost Breeze x 2, protonix, milrinone drip  Labs reviewed: BUN 31, creatinine 2.37, elevated LFTs (trending down), hemoglobin 8.5, INR 3.6  I/O's: -201 ml since admit  NUTRITION - FOCUSED PHYSICAL EXAM:  Flowsheet Row Most Recent Value  Orbital Region Moderate depletion  Upper Arm Region Moderate depletion  Thoracic and Lumbar Region Moderate depletion  Buccal Region Moderate depletion  Temple Region Moderate depletion  Clavicle Bone Region Moderate depletion  Clavicle and Acromion Bone Region Moderate depletion  Scapular Bone Region Moderate depletion  Dorsal Hand Mild depletion  Patellar Region Mild depletion  Anterior Thigh Region Moderate depletion  Posterior Calf Region Moderate depletion  Edema (RD Assessment) None  Hair Reviewed  Eyes Reviewed  Mouth Reviewed  Skin Reviewed  Nails Reviewed  Diet Order:   Diet Order             Diet regular Room service appropriate? Yes; Fluid consistency: Thin  Diet effective now                   EDUCATION NEEDS:   Education needs have been addressed  Skin:  Skin Assessment: Reviewed RN Assessment  Last BM:  02/09/22  Height:   Ht Readings from Last 1 Encounters:  02/09/22 5\' 7"  (1.702 m)    Weight:   Wt Readings from Last 1 Encounters:  02/10/22 71.7 kg     BMI:  Body mass index is 24.76 kg/m.  Estimated Nutritional Needs:   Kcal:  2000-2200  Protein:  100-115 grams  Fluid:  2.0 L    Gustavus Bryant, MS, RD, LDN Inpatient Clinical Dietitian Please see AMiON for contact information.

## 2022-02-10 NOTE — Progress Notes (Signed)
Attempted PICC placement right basilic vein, met resistance with guidewire, therefore aborted and attempted right brachial vein. Accessed the vein with ease, guidewire threaded easily, but unable to thread PICC centrally. Staff RN aware and she will notify MD. Will continue to monitor.

## 2022-02-10 NOTE — Evaluation (Signed)
Occupational Therapy Evaluation Patient Details Name: Johnny Oneill MRN: [id number] DOB: 07-15-1941 Today's Date: 02/10/2022   History of Present Illness Pt is a 81 y.o. male who presented 02/09/22 with concern for RV failure and cardiogenic shock; FTT . 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 Both pt and wife endorse increased fatigue and SOB with activity.  Wife reports pt has a "poor appetite" with decreased activity tolerance. Pt able to complete ADL task and ambulate @ 16ft on RA with VSS. Pt reports shortness of breath is "much better". Acute OT to follow to educate pt/wife on energy conservation strategies and strategies to reduce risk of falls.     Recommendations for follow up therapy are one component of a multi-disciplinary discharge planning process, led by the attending physician.  Recommendations may be updated based on patient status, additional functional criteria and insurance authorization.   Follow Up Recommendations  No OT follow up    Assistance Recommended at Discharge Intermittent Supervision/Assistance  Patient can return home with the following A little help with walking and/or transfers;A little help with bathing/dressing/bathroom;Assistance with cooking/housework;Assist for transportation    Functional Status Assessment  Patient has had a recent decline in their functional status and demonstrates the ability to make significant improvements in function in a reasonable and predictable amount of time.  Equipment Recommendations  None recommended by OT    Recommendations for Other Services  (mobility specialists)     Precautions / Restrictions Precautions Precautions: Knee;Fall;Other (comment) Precaution Booklet Issued: No Precaution Comments: LVAD, watch HR and BP (hx of orthostatic  hypotension), R TKA 12/03/21 Restrictions Weight Bearing Restrictions: No RLE Weight Bearing: Weight bearing as tolerated      Mobility Bed Mobility Overal bed mobility: Modified Independent Bed Mobility: Supine to Sit, Sit to Supine     Supine to sit: Modified independent (Device/Increase time), HOB elevated Sit to supine: Modified independent (Device/Increase time), HOB elevated   General bed mobility comments: Mod I for bed mobility, HOB elevated    Transfers Overall transfer level: Needs assistance Equipment used: Quad cane Transfers: Sit to/from Stand Sit to Stand: Supervision                  Balance Overall balance assessment: Needs assistance Sitting-balance support: No upper extremity supported, Feet supported Sitting balance-Leahy Scale: Good     Standing balance support: Single extremity supported, No upper extremity supported, During functional activity Standing balance-Leahy Scale: Fair Standing balance comment: Able to stand without UE support, benefits from 1 UE support for mobility                           ADL either performed or assessed with clinical judgement   ADL Overall ADL's : Needs assistance/impaired Eating/Feeding: Modified independent   Grooming: Modified independent   Upper Body Bathing: Set up   Lower Body Bathing: Minimal assistance;Sit to/from stand   Upper Body Dressing : Set up   Lower Body Dressing: Minimal assistance   Toilet Transfer: Set up (quad cane)   Toileting- Clothing Manipulation and Hygiene: Supervision/safety       Functional mobility during ADLs: Min guard General ADL Comments: easily fatigues however improved from admission     Vision         Perception  Praxis      Pertinent Vitals/Pain Pain Assessment Pain Assessment: No/denies pain     Hand Dominance Right   Extremity/Trunk Assessment Upper Extremity Assessment Upper Extremity Assessment: Overall WFL for tasks assessed    Lower Extremity Assessment Lower Extremity Assessment: Defer to PT evaluation RLE Deficits / Details: Noted knee flexion AROM deficits, achieving ~100 degrees flexion; lacks maybe ~2-4 degrees of knee extension AROM   Cervical / Trunk Assessment Cervical / Trunk Assessment: Normal   Communication Communication Communication: No difficulties   Cognition Arousal/Alertness: Awake/alert Behavior During Therapy: WFL for tasks assessed/performed, Flat affect Overall Cognitive Status: Within Functional Limits for tasks assessed                                       General Comments  VSS    Exercises     Shoulder Instructions      Home Living Family/patient expects to be discharged to:: Private residence Living Arrangements: Spouse/significant other Available Help at Discharge: Family;Available 24 hours/day Type of Home: House Home Access: Stairs to enter [employer] of Steps: 4 Entrance Stairs-Rails: Can reach both (far stretch) Home Layout: Two level;Able to live on main level with bedroom/bathroom Alternate Level Stairs-Number of Steps: 14 Alternate Level Stairs-Rails: Right (ascending) Bathroom Shower/Tub: Tub only;Walk-in shower   Bathroom Toilet: Standard Bathroom Accessibility: Yes How Accessible: Accessible via walker Home Equipment: Shower seat - built in;Rollator (4 wheels);Rolling Walker (2 wheels);BSC/3in1;Cane - quad (grab bars on seperate tub)          Prior Functioning/Environment Prior Level of Function : Independent/Modified Independent             Mobility Comments: Has been using the quad-cane recently, mod I. No falls since last admission and no further episodes of symptomatic orthostatic hypotension ADLs Comments: Wife assists for dressing his LVAD for showers, but otherwise mod I - some difficulty with LB dressing due to imited ROM R knee        OT Problem List: Decreased activity tolerance;Decreased knowledge of use  of DME or AE;Decreased knowledge of precautions;Cardiopulmonary status limiting activity      OT Treatment/Interventions: Self-care/ADL training;Energy conservation;DME and/or AE instruction;Patient/family education    OT Goals(Current goals can be found in the care plan section) Acute Rehab OT Goals Patient Stated Goal: to feel better OT Goal Formulation: With patient Time For Goal Achievement: 02/24/22 Potential to Achieve Goals: Good  OT Frequency: Min 2X/week    Co-evaluation              AM-PAC OT "6 Clicks" Daily Activity     Outcome Measure Help from another person eating meals?: None Help from another person taking care of personal grooming?: None Help from another person toileting, which includes using toliet, bedpan, or urinal?: A Little Help from another person bathing (including washing, rinsing, drying)?: A Little Help from another person to put on and taking off regular upper body clothing?: None Help from another person to put on and taking off regular lower body clothing?: A Little 6 Click Score: 21   End of Session Equipment Utilized During Treatment: Rolling walker (2 wheels) Nurse Communication: Mobility status  Activity Tolerance: Patient tolerated treatment well Patient left: in bed;with call bell/phone within reach;with bed alarm set;with family/visitor present  OT Visit Diagnosis: Unsteadiness on feet (R26.81);Muscle weakness (generalized) (M62.81)  Time: 2836-6294 OT Time Calculation (min): 20 min Charges:  OT General Charges $OT Visit: 1 Visit OT Evaluation $OT Eval Moderate Complexity: [place], OT/L   Acute OT Clinical Specialist Acute Rehabilitation Services Pager [phone] Office [phone]   [place] 02/10/2022, 1:58 PM

## 2022-02-10 NOTE — Evaluation (Signed)
Physical Therapy Evaluation Patient Details Name: Johnny Oneill MRN: [id number] DOB: [birth year] Today's Date: 02/10/2022  History of Present Illness  Pt is a 81 y.o. male who presented 02/09/22 with concern for RV failure and shock liver. PMhx: CKD, CHF, HTN, LVAD HM II 2015, HLD, CAD, Lt TKA, ICD, R TKA April 2023   Clinical Impression  Pt presents with condition above and deficits mentioned below, see PT Problem List. PTA, he was mod I using his quad-cane for mobility, reporting no falls and no further bouts of symptomatic orthostatic hypotension since his most recent admission. Pt is familiar to this PT from previous admissions. Currently, pt still lacks some R knee flexion/extension AROM (s/p TKA 12/03/21) and demonstrates deficits in activity tolerance and balance. Pt required min guard-minA to ambulate with his quad-cane today, improving in stability as distance progressed. Pt would benefit from follow-up with OPPT to further address his R knee limitations and balance. Will continue to follow acutely.       Recommendations for follow up therapy are one component of a multi-disciplinary discharge planning process, led by the attending physician.  Recommendations may be updated based on patient status, additional functional criteria and insurance authorization.  Follow Up Recommendations Outpatient PT    Assistance Recommended at Discharge Intermittent Supervision/Assistance  Patient can return home with the following  A little help with walking and/or transfers;A little help with bathing/dressing/bathroom;Assistance with cooking/housework;Assist for transportation;Help with stairs or ramp for entrance    Equipment Recommendations None recommended by PT  Recommendations for Other Services       Functional Status Assessment Patient has had a recent decline in their functional status and demonstrates the ability to make significant improvements in function in a reasonable and predictable  amount of time.     Precautions / Restrictions Precautions Precautions: Knee;Fall;Other (comment) Precaution Booklet Issued: No Precaution Comments: LVAD, watch HR and BP (hx of orthostatic hypotension), R TKA 12/03/21 Restrictions Weight Bearing Restrictions: No RLE Weight Bearing: Weight bearing as tolerated      Mobility  Bed Mobility Overal bed mobility: Modified Independent Bed Mobility: Supine to Sit, Sit to Supine     Supine to sit: Modified independent (Device/Increase time), HOB elevated Sit to supine: Modified independent (Device/Increase time), HOB elevated   General bed mobility comments: Mod I for bed mobility, HOB elevated    Transfers Overall transfer level: Needs assistance Equipment used: Quad cane Transfers: Sit to/from Stand Sit to Stand: Min guard           General transfer comment: min guard for safety    Ambulation/Gait Ambulation/Gait assistance: Min guard, Min assist Gait Distance (Feet): 200 Feet Assistive device: Quad cane Gait Pattern/deviations: Decreased stride length, Decreased stance time - right, Decreased weight shift to right, Antalgic, Step-through pattern, Trunk flexed, Decreased step length - left Gait velocity: decreased Gait velocity interpretation: <1.8 ft/sec, indicate of risk for recurrent falls   General Gait Details: Pt with slow gait and still displaying a mild antalgic gait pattern. Initially, pt reaching for objects/wall on R with increased R lateral flexion of trunk and need for minA for stability. As distance progressed he only required min guard assist and only held onto his cane, intermittently carrying it.  Stairs            Wheelchair Mobility    Modified Rankin (Stroke Patients Only)       Balance Overall balance assessment: Needs assistance Sitting-balance support: No upper extremity supported, Feet supported Sitting balance-Leahy Scale: Good  Standing balance support: Single extremity  supported, No upper extremity supported, During functional activity Standing balance-Leahy Scale: Fair Standing balance comment: Able to stand without UE support, benefits from 1 UE support for mobility                             Pertinent Vitals/Pain Pain Assessment Pain Assessment: Faces Faces Pain Scale: Hurts a little bit Pain Location: R knee Pain Descriptors / Indicators: Discomfort, Guarding Pain Intervention(s): Monitored during session, Limited activity within patient's tolerance, Repositioned    Home Living Family/patient expects to be discharged to:: Private residence Living Arrangements: Spouse/significant other Available Help at Discharge: Family;Available 24 hours/day Type of Home: House Home Access: Stairs to enter Entrance Stairs-Rails: Can reach both (far stretch) Entrance Stairs-Number of Steps: 4 Alternate Level Stairs-Number of Steps: 14 Home Layout: Two level;Able to live on main level with bedroom/bathroom Home Equipment: Shower seat - built in;Rollator (4 wheels);Rolling Walker (2 wheels);BSC/3in1;Cane - quad (grab bars on seperate tub)      Prior Function Prior Level of Function : Independent/Modified Independent             Mobility Comments: Has been using the quad-cane recently, mod I. No falls since last admission and no further episodes of symptomatic orthostatic hypotension ADLs Comments: Wife assists for dressing his LVAD for showers, but otherwise mod I     Hand Dominance   Dominant Hand: Right    Extremity/Trunk Assessment   Upper Extremity Assessment Upper Extremity Assessment: Defer to OT evaluation    Lower Extremity Assessment Lower Extremity Assessment: RLE deficits/detail (MMT scores of 4+ to 5 grossly bil) RLE Deficits / Details: Noted knee flexion AROM deficits, achieving ~100 degrees flexion; lacks maybe ~2-4 degrees of knee extension AROM    Cervical / Trunk Assessment Cervical / Trunk Assessment: Normal   Communication   Communication: No difficulties  Cognition Arousal/Alertness: Awake/alert Behavior During Therapy: WFL for tasks assessed/performed Overall Cognitive Status: Within Functional Limits for tasks assessed                                          General Comments      Exercises     Assessment/Plan    PT Assessment Patient needs continued PT services  PT Problem List Decreased strength;Decreased range of motion;Decreased mobility;Decreased activity tolerance;Decreased balance;Decreased knowledge of use of DME;Pain;Cardiopulmonary status limiting activity       PT Treatment Interventions DME instruction;Therapeutic exercise;Gait training;Stair training;Functional mobility training;Therapeutic activities;Patient/family education;Balance training;Neuromuscular re-education    PT Goals (Current goals can be found in the Care Plan section)  Acute Rehab PT Goals Patient Stated Goal: to go home and get better PT Goal Formulation: With patient Time For Goal Achievement: 02/24/22 Potential to Achieve Goals: Good    Frequency Min 3X/week     Co-evaluation               AM-PAC PT "6 Clicks" Mobility  Outcome Measure Help needed turning from your back to your side while in a flat bed without using bedrails?: None Help needed moving from lying on your back to sitting on the side of a flat bed without using bedrails?: None Help needed moving to and from a bed to a chair (including a wheelchair)?: A Little Help needed standing up from a chair using your arms (e.g., wheelchair or bedside  chair)?: A Little Help needed to walk in hospital room?: A Little Help needed climbing 3-5 steps with a railing? : A Little 6 Click Score: 20    End of Session   Activity Tolerance: Patient tolerated treatment well Patient left: in bed;with call bell/phone within reach;with bed alarm set   PT Visit Diagnosis: Other abnormalities of gait and mobility  (R26.89);Muscle weakness (generalized) (M62.81);Unsteadiness on feet (R26.81);Difficulty in walking, not elsewhere classified (R26.2);Pain Pain - Right/Left: Right Pain - part of body: Knee    Time: 0802-2336 PT Time Calculation (min) (ACUTE ONLY): 23 min   Charges:   PT Evaluation $PT Eval Moderate Complexity: 1 Mod PT Treatments $Gait Training: 8-22 mins        Moishe Spice, PT, DPT Acute Rehabilitation Services  Office: [phone]   Orvan Falconer 02/10/2022, 12:23 PM

## 2022-02-11 ENCOUNTER — Encounter (HOSPITAL_COMMUNITY): Payer: Medicare Other

## 2022-02-11 DIAGNOSIS — I50813 Acute on chronic right heart failure: Secondary | ICD-10-CM | POA: Diagnosis not present

## 2022-02-11 LAB — CBC
HCT: 27.4 % — ABNORMAL LOW (ref 39.0–52.0)
HCT: 27.4 % — ABNORMAL LOW (ref 39.0–52.0)
Hemoglobin: 8.6 g/dL — ABNORMAL LOW (ref 13.0–17.0)
Hemoglobin: 8.5 g/dL — ABNORMAL LOW (ref 13.0–17.0)
MCH: 29.8 pg (ref 26.0–34.0)
MCH: 29.9 pg (ref 26.0–34.0)
MCHC: 31.4 g/dL (ref 30.0–36.0)
MCHC: 31 g/dL (ref 30.0–36.0)
MCV: 94.8 fL (ref 80.0–100.0)
MCV: 96.5 fL (ref 80.0–100.0)
Platelets: 205 K/uL (ref 150–400)
Platelets: 189 10*3/uL (ref 150–400)
RBC: 2.89 MIL/uL — ABNORMAL LOW (ref 4.22–5.81)
RBC: 2.84 MIL/uL — ABNORMAL LOW (ref 4.22–5.81)
RDW: 19.5 % — ABNORMAL HIGH (ref 11.5–15.5)
RDW: 19.1 % — ABNORMAL HIGH (ref 11.5–15.5)
WBC: 6.3 K/uL (ref 4.0–10.5)
WBC: 6 10*3/uL (ref 4.0–10.5)
nRBC: 0 % (ref 0.0–0.2)
nRBC: 0 % (ref 0.0–0.2)

## 2022-02-11 LAB — BASIC METABOLIC PANEL
Anion gap: 8 (ref 5–15)
BUN: 31 mg/dL — ABNORMAL HIGH (ref 8–23)
CO2: 28 mmol/L (ref 22–32)
Calcium: 8.9 mg/dL (ref 8.9–10.3)
Chloride: 104 mmol/L (ref 98–111)
Creatinine, Ser: 2.2 mg/dL — ABNORMAL HIGH (ref 0.61–1.24)
GFR, Estimated: 30 mL/min — ABNORMAL LOW (ref >60)
Glucose, Bld: 124 mg/dL — ABNORMAL HIGH (ref 70–99)
Potassium: 3.5 mmol/L (ref 3.5–5.1)
Sodium: 140 mmol/L (ref 135–145)

## 2022-02-11 LAB — COMPREHENSIVE METABOLIC PANEL
ALT: 530 U/L — ABNORMAL HIGH (ref 0–44)
AST: 108 U/L — ABNORMAL HIGH (ref 15–41)
Albumin: 3.2 g/dL — ABNORMAL LOW (ref 3.5–5.0)
Alkaline Phosphatase: 117 U/L (ref 38–126)
Anion gap: 10 (ref 5–15)
BUN: 29 mg/dL — ABNORMAL HIGH (ref 8–23)
CO2: 29 mmol/L (ref 22–32)
Calcium: 8.9 mg/dL (ref 8.9–10.3)
Chloride: 101 mmol/L (ref 98–111)
Creatinine, Ser: 2.3 mg/dL — ABNORMAL HIGH (ref 0.61–1.24)
GFR, Estimated: 28 mL/min — ABNORMAL LOW (ref >60)
Glucose, Bld: 104 mg/dL — ABNORMAL HIGH (ref 70–99)
Potassium: 3.1 mmol/L — ABNORMAL LOW (ref 3.5–5.1)
Sodium: 140 mmol/L (ref 135–145)
Total Bilirubin: 1.4 mg/dL — ABNORMAL HIGH (ref 0.3–1.2)
Total Protein: 6.1 g/dL — ABNORMAL LOW (ref 6.5–8.1)

## 2022-02-11 LAB — PROTIME-INR
INR: 2.5 — ABNORMAL HIGH (ref 0.8–1.2)
INR: 2.1 — ABNORMAL HIGH (ref 0.8–1.2)
Prothrombin Time: 26.4 seconds — ABNORMAL HIGH (ref 11.4–15.2)
Prothrombin Time: 23.8 seconds — ABNORMAL HIGH (ref 11.4–15.2)

## 2022-02-11 LAB — LACTATE DEHYDROGENASE: LDH: 243 U/L — ABNORMAL HIGH (ref 98–192)

## 2022-02-11 MED ORDER — ASPIRIN 81 MG PO TBEC
81.0000 mg | DELAYED_RELEASE_TABLET | Freq: Every day | ORAL | Status: DC
  Administered 2022-02-13 – 2022-02-16 (×4): 81 mg via ORAL
  Filled 2022-02-11 (×4): qty 1

## 2022-02-11 MED ORDER — WARFARIN SODIUM 2.5 MG PO TABS
2.5000 mg | ORAL_TABLET | Freq: Once | ORAL | Status: AC
  Administered 2022-02-11: 2.5 mg via ORAL
  Filled 2022-02-11: qty 1

## 2022-02-11 MED ORDER — ASPIRIN 81 MG PO CHEW
81.0000 mg | CHEWABLE_TABLET | ORAL | Status: AC
  Administered 2022-02-12: 81 mg via ORAL
  Filled 2022-02-11: qty 1

## 2022-02-11 MED ORDER — SODIUM CHLORIDE 0.9% FLUSH: 3.0000 mL | INTRAVENOUS | Status: DC | PRN

## 2022-02-11 MED ORDER — POTASSIUM CHLORIDE CRYS ER 20 MEQ PO TBCR
40.0000 meq | EXTENDED_RELEASE_TABLET | Freq: Once | ORAL | Status: AC
  Administered 2022-02-11: 40 meq via ORAL
  Filled 2022-02-11: qty 2

## 2022-02-11 MED ORDER — SODIUM CHLORIDE 0.9 % IV SOLN: INTRAVENOUS | Status: DC

## 2022-02-11 MED ORDER — SODIUM CHLORIDE 0.9% FLUSH
3.0000 mL | Freq: Two times a day (BID) | INTRAVENOUS | Status: DC
  Administered 2022-02-11 – 2022-02-16 (×6): 3 mL via INTRAVENOUS

## 2022-02-11 MED ORDER — WARFARIN - PHARMACIST DOSING INPATIENT: Freq: Every day | Status: DC

## 2022-02-11 MED ORDER — POTASSIUM CHLORIDE CRYS ER 20 MEQ PO TBCR
20.0000 meq | EXTENDED_RELEASE_TABLET | Freq: Once | ORAL | Status: AC
Start: 2022-02-11 — End: 2022-02-11
  Administered 2022-02-11: 20 meq via ORAL
  Filled 2022-02-11: qty 1

## 2022-02-11 MED ORDER — SODIUM CHLORIDE 0.9 % IV SOLN: 250.0000 mL | INTRAVENOUS | Status: DC | PRN

## 2022-02-11 NOTE — Progress Notes (Signed)
ANTICOAGULATION CONSULT NOTE - Initial Consult  Pharmacy Consult for warfarin Indication:  LVAD  Allergies  Allergen Reactions   Ace Inhibitors Other (See Comments)    Hypotension and elevated creatinine   Amiodarone Other (See Comments)    Cerebellar side effects   Diovan [Valsartan] Other (See Comments)    Hypotension at low dose, hyperkalemia   Lipitor [Atorvastatin Calcium] Other (See Comments)    Muscle pain   Jardiance [Empagliflozin] Other (See Comments)    Unsure of exact side effect   Norvasc [Amlodipine] Other (See Comments)    Put patient to sleep    Patient Measurements: Height: 5\' 7"  (170.2 cm) Weight: 67.8 kg (149 lb 7.6 oz) IBW/kg (Calculated) : 66.1  Vital Signs: Temp: 97.5 F (36.4 C) (06/14 0747) Temp Source: Oral (06/14 0747) BP: 125/100 (06/14 0747) Pulse Rate: 81 (06/14 0747)  Labs: Recent Labs    02/09/22 0929 02/10/22 0612 02/11/22 0252  HGB 8.8* 8.5* 8.6*  HCT 29.2* 27.2* 27.4*  PLT 230 202 205  LABPROT 49.2* 35.3* 26.4*  INR 5.5* 3.6* 2.5*  CREATININE 2.41* 2.37* 2.30*    Estimated Creatinine Clearance: 24.7 mL/min (A) (by C-G formula based on SCr of 2.3 mg/dL (H)).   Medical History: Past Medical History:  Diagnosis Date   Anxiety    Automatic implantable cardioverter-defibrillator in situ    CAD (coronary artery disease)    S/P stenting of LCX in 2004;  s/p Lat MI 2009 with occlusion of the LCX - treated with Promus stenting. Has total occlusion of the RCA.    CHF (congestive heart failure) (HCC)    Hypercholesteremia    Hypertension    Hypothyroidism    ICD (implantable cardiac defibrillator) in place    PROPHYLACTIC      medtronic   Inferior MI ([place]) "? date"; 2009   Ischemic cardiomyopathy    a. 10/09 Echo: Sev LV Dysfxn, inf/lat AK, Mod MR.   Liver disease    LVAD (left ventricular assist device) present ([place])    Osteoarthritis    a. s/p R TKR   Pacemaker    PVC (premature ventricular contraction)    RBBB     Shortness of breath     Medications:  Scheduled:   aspirin EC  81 mg Oral q AM   citalopram  10 mg Oral q morning   feeding supplement  237 mL Oral BID BM   Gerhardt's butt cream   Topical Daily   levothyroxine  175 mcg Oral Once per day on Mon Tue Wed Thu Fri Sat   [START ON 02/15/2022] levothyroxine  87.5 mcg Oral Once per day on Sun   mexiletine  150 mg Oral BID   multivitamin with minerals  1 tablet Oral Daily   pantoprazole  40 mg Oral Daily   potassium chloride  20 mEq Oral Once   sotalol  80 mg Oral Daily   warfarin  2.5 mg Oral ONCE-1600   Warfarin - Pharmacist Dosing Inpatient   Does not apply q1600    Assessment: 81 yo male on chronic Coumadin for LVAD admitted with supratherapeutic INR.  INR has been trending down after small dose of vitamin K on 6/12.  INR back in range to 2.4 today.  Discussed with Dr. Haroldine Laws, okay to cautiously restart Coumadin today to attempt to keep INR > 2.  Planning RHC in next day or so, so will try to keep ~ 2 for now.  Of note, his INR goal is usually higher (  2.5-3) due to elevations in LDH in the past.  Goal of Therapy:  INR ~ 2 for now, long-term goal 2.5-3 Monitor platelets by anticoagulation protocol: Yes   Plan:  Warfarin 2.5 mg po x 1 tonight Daily INR.  [place] Crane, Roylene Reason, BCCP Clinical Pharmacist  02/11/2022 12:28 PM   [facility] pharmacy phone numbers are listed on [email or web address]

## 2022-02-11 NOTE — Progress Notes (Signed)
LVAD Coordinator Rounding Note:  Direct admit 02/09/22 from home following clinic visit.  Pt admitted with RV failure and shock liver.  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 Darcey [name] under bridge to transplant criteria. Patient was removed from transplant list 05/2016 per his choice. He is currently Destination Therapy LVAD.   Pt sitting up in bed after walking around the unit with mobility specialist. States he is feeling much better today. He ate most of his breakfast this morning.   Milrinone 0.125 mcg/kg/min via PIV. Unable to place PICC. INR 2.5 - holding coumadin. BP remains elevated- Florinef has long half life.   Plan for right heart cath tomorrow morning at 0900. [name] is aware.   Vital signs       Temp: 97.5 HR: 81  Doppler Pressure: 160     Automatic BP: 125/100 (109)   O2 Sat: 96% on RA Wt: 158>149.4 lbs  LVAD interrogation reveals:  Speed: 9600    Flow: 4.0    Power: 5.3 w   PI: 6.8      Alarms: none yesterday or today Events: none  Fixed speed:  9600 Low speed limit: 9000  Drive Line:  Drive line is being maintained weekly by [name], his wife. Existing VAD dressing clean, dry, and intact. Drive line anchor correctly applied. Weekly dressing changes per [facility] nurse or caregiver. Next dressing change due 02/17/22.   Labs:  LDH trend: [phone]  INR trend: 5.5>3.6>2.5  AST/ALT: 537/1003>227/760>105/530  Anticoagulation Plan: -INR Goal:  2.5 - 3.0 - Currently holding -ASA Dose: 81 mg daily   Device: Medtronic dual - Therapies: on 231 bpm - Pacing:  AAI<=>DDD 80 bpm - Last check: in ED per Medtronic rep on 12/18/21 - rate response turned on   Plan/Recommendations:  Call VAD Coordinator with any VAD equipment or drive line issues. Weekly dressing changes per [facility] nurse or caregiver.   Emerson Monte RN [place] Coordinator  Office: [phone]  24/7 Pager: [phone]

## 2022-02-11 NOTE — Plan of Care (Signed)
  Problem: Education: Goal: Knowledge of General Education information will improve Description: Including pain rating scale, medication(s)/side effects and non-pharmacologic comfort measures Outcome: Progressing   Problem: Health Behavior/Discharge Planning: Goal: Ability to manage health-related needs will improve Outcome: Progressing   Problem: Clinical Measurements: Goal: Respiratory complications will improve Outcome: Progressing Goal: Cardiovascular complication will be avoided Outcome: Progressing   Problem: Activity: Goal: Risk for activity intolerance will decrease Outcome: Progressing   Problem: Coping: Goal: Level of anxiety will decrease Outcome: Progressing   Problem: Elimination: Goal: Will not experience complications related to bowel motility Outcome: Progressing Goal: Will not experience complications related to urinary retention Outcome: Progressing   Problem: Pain Managment: Goal: General experience of comfort will improve Outcome: Progressing   Problem: Education: Goal: Patient will understand all VAD equipment and how it functions Outcome: Progressing   Problem: Cardiac: Goal: LVAD will function as expected and patient will experience no clinical alarms Outcome: Progressing

## 2022-02-11 NOTE — Progress Notes (Signed)
Mobility Specialist Progress Note    02/11/22 1051  Mobility  Activity Ambulated independently in hallway  Level of Assistance Independent  Assistive Device Cane  Distance Ambulated (ft) 420 ft  Activity Response Tolerated well  $Mobility charge 1 Mobility   Pre-Mobility: 83 HR, 94% SpO2 Post-Mobility: 87 HR, 96% SpO2  Pt received in bed and agreeable. No complaints on walk. Returned to bed with call bell in reach.    Hildred Alamin Mobility Specialist

## 2022-02-11 NOTE — Progress Notes (Addendum)
Advanced Heart Failure VAD Team Note  PCP-Cardiologist: Dr. Haroldine Laws   Subjective:     6/12: Direct admit for inotropic support given concern for RV failure and shock liver. Started on milrinone. Given PO Vit K (INR 5.5)  6/12: 18 beat run of NSVT (K 2.7, Mg 1.9). Several low flow alarms on VAD  6/13 Florinef stopped.   On empiric milrinone 0.125. AST 537>>227>>108 ALT 1003>>760 >>530   INR 5.5>>3.6>>2.5  Hgb 8.6 No gross bleeding  LDH 450>>287 >>242  SCr 2.41>>2.37 >>2.3  K 3.1   PICC team unable to place PICC.  Walked 2 times. No dizziness.  LVAD INTERROGATION:  HeartMate II LVAD:   Flow 3.6 liters/min, speed 9600, power 5.0, PI 7 No PI events.  Objective:    Vital Signs:   Temp:  [97.3 F (36.3 C)-98.2 F (36.8 C)] 97.5 F (36.4 C) (06/14 0747) Pulse Rate:  [78-84] 81 (06/14 0747) Resp:  [19-21] 19 (06/14 0747) BP: (105-132)/(82-109) 125/100 (06/14 0747) SpO2:  [94 %-99 %] 96 % (06/14 0747) Weight:  [67.8 kg] 67.8 kg (06/14 0600) Last BM Date : 02/10/22 Mean arterial Pressure 110s  Intake/Output:   Intake/Output Summary (Last 24 hours) at 02/11/2022 0758 Last data filed at 02/11/2022 0600 Gross per 24 hour  Intake 600 ml  Output 1925 ml  Net -1325 ml     Physical Exam   Physical Exam: GENERAL: No acute distress. HEENT: normal  NECK: Supple, JVP 5-6   .  2+ bilaterally, no bruits.  No lymphadenopathy or thyromegaly appreciated.   CARDIAC:  Mechanical heart sounds with LVAD hum present.  LUNGS:  Clear to auscultation bilaterally.  ABDOMEN:  Soft, round, nontender, positive bowel sounds x4.     LVAD exit site: well-healed and incorporated.  Dressing dry and intact.  No erythema or drainage.  Stabilization device present and accurately applied.  Driveline dressing is being changed daily per sterile technique. EXTREMITIES:  Warm and dry, no cyanosis, clubbing, rash or edema  NEUROLOGIC:  Alert and oriented x 3.    No aphasia.  No dysarthria.  Affect  pleasant.     Telemetry  A-V paced 80s   EKG    No new EKG to review   Labs   Basic Metabolic Panel: Recent Labs  Lab 02/09/22 0929 02/10/22 0258 02/10/22 0612 02/11/22 0252  NA 139  --  140 140  K 2.7* 4.0 4.5 3.1*  CL 105  --  104 101  CO2 25  --  25 29  GLUCOSE 121*  --  116* 104*  BUN 29*  --  31* 29*  CREATININE 2.41*  --  2.37* 2.30*  CALCIUM 8.7*  --  9.3 8.9  MG 1.9 1.9  --   --     Liver Function Tests: Recent Labs  Lab 02/09/22 0929 02/10/22 0612 02/11/22 0252  AST 537* 227* 108*  ALT 1,003* 760* 530*  ALKPHOS 136* 124 117  BILITOT 1.4* 1.4* 1.4*  PROT 6.6 6.3* 6.1*  ALBUMIN 3.4* 3.2* 3.2*   No results for input(s): "LIPASE", "AMYLASE" in the last 168 hours. No results for input(s): "AMMONIA" in the last 168 hours.  CBC: Recent Labs  Lab 02/09/22 0929 02/10/22 0612 02/11/22 0252  WBC 6.9 6.3 6.3  HGB 8.8* 8.5* 8.6*  HCT 29.2* 27.2* 27.4*  MCV 97.3 96.1 94.8  PLT 230 202 205    INR: Recent Labs  Lab 02/09/22 0929 02/10/22 0612 02/11/22 0252  INR 5.5* 3.6* 2.5*  Other results: EKG:    Imaging   DG Chest Port 1 View  Result Date: 02/09/2022 CLINICAL DATA:  Heart failure EXAM: PORTABLE CHEST 1 VIEW COMPARISON:  11/24/2021, 04/20/2021 FINDINGS: Post sternotomy changes. Left-sided pacing device as before. Similar orientation of LVAD. Cardiomegaly without edema, pleural effusion, airspace disease or pneumothorax. IMPRESSION: No active disease.  Cardiomegaly. Electronically Signed   By: Donavan Foil M.D.   On: 02/09/2022 19:42   [place] EKG SITE RITE  Result Date: 02/09/2022 If Site Rite image not attached, placement could not be confirmed due to current cardiac rhythm.    Medications:     Scheduled Medications:  aspirin EC  81 mg Oral q AM   citalopram  10 mg Oral q morning   feeding supplement  237 mL Oral BID BM   Gerhardt's butt cream   Topical Daily   levothyroxine  175 mcg Oral Once per day on Mon Tue Wed Thu Fri Sat    [START ON 02/15/2022] levothyroxine  87.5 mcg Oral Once per day on Sun   mexiletine  150 mg Oral BID   multivitamin with minerals  1 tablet Oral Daily   pantoprazole  40 mg Oral Daily   sotalol  80 mg Oral Daily    Infusions:  sodium chloride 10 mL/hr at 02/10/22 0300   milrinone 0.125 mcg/kg/min (02/10/22 2043)    PRN Medications: sodium chloride, acetaminophen, melatonin   Patient Profile   81 y/o male w/ CAD, 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   - Echo 3/18 EF 10-15% mild AI. Mod RV dysfunction. LVAD cannula OK - Echo 4/23 EF 20-25%, RV mod reduced  - has struggled with RV failure however RHC 11/22 looked good (despite evidence of RVF on LE dopplers showing reflux - Now NYHA IV. Has not bounced back after his knee surgery. + weakness & anorexia. Concern for RV failure. Labs c/w shock  - PICC team unable to place PICC.  - On empiric milrinone 0.125.  - Plan RHC tomorrow. Discussed.  - GDMT limited by orthostatic hypotension.  - Failed Jardiance due to volume depletion - He has not tolerated ACE-I or amlodipine or b-blocker in the past.   2) Weakness/anorexia - Suspect due to RV failure/ shock.  - Plan per above  - Will plan RHC tomorrow.    3) LVAD: HMII 08/2013 for DT. - VAD interrogated personally. Parameters stable. - Admitted in May 2018 with elevated LDH with bival.  - LDH trending down 243 today  - INR elevated INR high on admit. Suspect RV failure.  - INR 2.5 today.  Holding Coumadin. Continue ASA  - DL site ok - MAPs 100s   4) Orthostatic hypotension, post-op - Resolved. 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  - Off amio due to cerebellar side effects.  - Recurrent VT  10/21. - VT  06/16/21 with  DC-CV x 1 - Recurrent VT 12/28/21 after stopping sotalol. Sotalol restarted. QT ok.  - PVCs improved on mexilitene. Continue mexilitene 150 bid - Keep K > 4.0 Mg > 2.0. Supp K  - Continue to f/u with EP. Options limited   6) Atrial flutter, recurrent - patient  paced out of AFL in 6/22. Atrial therapies enabled  - continue sotalol (on for VT). QTs slightly prolonged but stable - NSR today    7) Chronic anticoagulation: INR goal 2.0-3.0.    - INR 5.5 on admit.  Suspect RV failure  - INR down w/ Vit K, 2.5 today  - Hold coumadin. Give Ensure - Continue ASA 81 mg for LVAD   8) HTN:  - Florinef discontinued. Long half-life.  - Milrinone per above  - Flornief has long half life. Maps 100s  - h/o orthostatic hypotension.    9) Hypothyroidism:  - on levothyroxine. TFTs ok  - Followed by Dr Dwyane Dee.    10) Hyperlipidemia:  - Continue Crestor. Zetia stopped due to cost.  - No change.    11) CAD - no s/s ischemia - continue Crestor   12) AKI on CKD IV - Creatinine baseline 1.7-2.0 - SCr 2.3 in setting of low output/cardiorenal  - Milrinone support per above   - failed Jardiance - Has Nephrology referral   13) Chronic Anemia  - Hgb 8.6 (stable)    14) Shock Liver - Suspect 2/2 RV failure  - Improving w/ milrinone  - AST 537>>227>>108 - ALT 1003>>760 >>530  - follow CMP   RHC tomorrow at 9 am   I reviewed the LVAD parameters from today, and compared the results to the patient's prior recorded data.  No programming changes were made.  The LVAD is functioning within specified parameters.  The patient performs LVAD self-test daily.  LVAD interrogation was negative for any significant power changes, alarms or PI events/speed drops.  LVAD equipment check completed and is in good working order.  Back-up equipment present.   LVAD education done on emergency procedures and precautions and reviewed exit site care.  Length of Stay: 2  Darrick Grinder,  NP 02/11/2022, 7:58 AM  VAD Team --- VAD ISSUES ONLY--- Pager [phone] (7am - 7am)  Advanced Heart Failure Team  Pager [phone] (M-F; 7a - 5p)  Please contact [facility] for night-coverage after hours (5p -7a ) and weekends on [email or web address]  Patient seen and examined with the above-signed Advanced Practice Provider and/or Housestaff. I personally reviewed laboratory data, imaging studies and relevant notes. I independently examined the patient and formulated the important aspects of the plan. I have edited the note to reflect any of my changes or salient points. I have personally discussed the plan with the patient and/or family.  RV failure much improved on milrinone. Diuresed well with IV lasix. Unable to get PICC. LFTs improving. Denies SOB, orthopnea. Has more energy.   General:  NAD.  HEENT: normal  Neck: supple. JVP not elevated.  Carotids 2+ bilat; no bruits. No lymphadenopathy or thryomegaly appreciated. Cor: LVAD hum.  Lungs: Clear. Abdomen:  soft, nontender, non-distended. No hepatosplenomegaly. No bruits or masses. Good bowel sounds. Driveline site clean. Anchor in place.  Extremities: no cyanosis, clubbing, rash. Warm no edema  Neuro: alert & oriented x 3. No focal deficits. Moves all 4 without problem   Continue milrinone. RHC tomorrow from leg. Suspect he will need home milrinone but placement of PICC may be a challenge.  VT quiescent.   VAD interrogated personally. Parameters stable. Resume warfarin.   Glori Bickers, MD  1:35 PM

## 2022-02-11 NOTE — Plan of Care (Signed)
  Problem: Education: Goal: Understanding of CV disease, CV risk reduction, and recovery process will improve Outcome: Progressing Goal: Individualized Educational Video(s) Outcome: Progressing   Problem: Activity: Goal: Ability to return to baseline activity level will improve Outcome: Progressing   Problem: Cardiovascular: Goal: Ability to achieve and maintain adequate cardiovascular perfusion will improve Outcome: Progressing Goal: Vascular access site(s) Level 0-1 will be maintained Outcome: Progressing   Problem: Health Behavior/Discharge Planning: Goal: Ability to safely manage health-related needs after discharge will improve Outcome: Progressing   Problem: Education: Goal: Knowledge of General Education information will improve Description: Including pain rating scale, medication(s)/side effects and non-pharmacologic comfort measures Outcome: Progressing   Problem: Health Behavior/Discharge Planning: Goal: Ability to manage health-related needs will improve Outcome: Progressing   Problem: Clinical Measurements: Goal: Ability to maintain clinical measurements within normal limits will improve Outcome: Progressing Goal: Will remain free from infection Outcome: Progressing Goal: Diagnostic test results will improve Outcome: Progressing Goal: Respiratory complications will improve Outcome: Progressing Goal: Cardiovascular complication will be avoided Outcome: Progressing   Problem: Activity: Goal: Risk for activity intolerance will decrease Outcome: Progressing   Problem: Nutrition: Goal: Adequate nutrition will be maintained Outcome: Progressing   Problem: Coping: Goal: Level of anxiety will decrease Outcome: Progressing   Problem: Elimination: Goal: Will not experience complications related to bowel motility Outcome: Progressing Goal: Will not experience complications related to urinary retention Outcome: Progressing   Problem: Pain Managment: Goal:  General experience of comfort will improve Outcome: Progressing   Problem: Safety: Goal: Ability to remain free from injury will improve Outcome: Progressing   Problem: Skin Integrity: Goal: Risk for impaired skin integrity will decrease Outcome: Progressing   Problem: Education: Goal: Patient will understand all VAD equipment and how it functions Outcome: Progressing Goal: Patient will be able to verbalize current INR target range and antiplatelet therapy for discharge home Outcome: Progressing   Problem: Cardiac: Goal: LVAD will function as expected and patient will experience no clinical alarms Outcome: Progressing

## 2022-02-12 ENCOUNTER — Ambulatory Visit (HOSPITAL_COMMUNITY): Admission: RE | Admit: 2022-02-12 | Payer: Medicare Other | Source: Home / Self Care | Admitting: Internal Medicine

## 2022-02-12 ENCOUNTER — Encounter (HOSPITAL_COMMUNITY): Payer: Self-pay | Admitting: Internal Medicine

## 2022-02-12 ENCOUNTER — Encounter (HOSPITAL_COMMUNITY)
Admission: RE | Disposition: A | Discharge status: Home / Self Care | Payer: Self-pay | Source: Ambulatory Visit | Attending: Internal Medicine

## 2022-02-12 DIAGNOSIS — N179 Acute kidney failure, unspecified: Secondary | ICD-10-CM | POA: Diagnosis not present

## 2022-02-12 DIAGNOSIS — I50813 Acute on chronic right heart failure: Secondary | ICD-10-CM | POA: Diagnosis not present

## 2022-02-12 DIAGNOSIS — N184 Chronic kidney disease, stage 4 (severe): Secondary | ICD-10-CM | POA: Diagnosis not present

## 2022-02-12 HISTORY — PX: RIGHT HEART CATH: CATH118263

## 2022-02-12 LAB — POCT I-STAT EG7
Acid-Base Excess: 4 mmol/L — ABNORMAL HIGH (ref 0.0–2.0)
Acid-Base Excess: 4 mmol/L — ABNORMAL HIGH (ref 0.0–2.0)
Acid-Base Excess: 6 mmol/L — ABNORMAL HIGH (ref 0.0–2.0)
Bicarbonate: 27.7 mmol/L (ref 20.0–28.0)
Bicarbonate: 29 mmol/L — ABNORMAL HIGH (ref 20.0–28.0)
Bicarbonate: 30.9 mmol/L — ABNORMAL HIGH (ref 20.0–28.0)
Calcium, Ion: 1.13 mmol/L — ABNORMAL LOW (ref 1.15–1.40)
Calcium, Ion: 1.17 mmol/L (ref 1.15–1.40)
Calcium, Ion: 1.23 mmol/L (ref 1.15–1.40)
HCT: 27 % — ABNORMAL LOW (ref 39.0–52.0)
HCT: 28 % — ABNORMAL LOW (ref 39.0–52.0)
HCT: 29 % — ABNORMAL LOW (ref 39.0–52.0)
Hemoglobin: 9.2 g/dL — ABNORMAL LOW (ref 13.0–17.0)
Hemoglobin: 9.5 g/dL — ABNORMAL LOW (ref 13.0–17.0)
Hemoglobin: 9.9 g/dL — ABNORMAL LOW (ref 13.0–17.0)
O2 Saturation: 46 %
O2 Saturation: 48 %
O2 Saturation: 62 %
Potassium: 3.5 mmol/L (ref 3.5–5.1)
Potassium: 3.6 mmol/L (ref 3.5–5.1)
Potassium: 3.7 mmol/L (ref 3.5–5.1)
Sodium: 142 mmol/L (ref 135–145)
Sodium: 143 mmol/L (ref 135–145)
Sodium: 144 mmol/L (ref 135–145)
TCO2: 29 mmol/L (ref 22–32)
TCO2: 30 mmol/L (ref 22–32)
TCO2: 32 mmol/L (ref 22–32)
pCO2, Ven: 37.5 mmHg — ABNORMAL LOW (ref 44–60)
pCO2, Ven: 42.5 mmHg — ABNORMAL LOW (ref 44–60)
pCO2, Ven: 44.5 mmHg (ref 44–60)
pH, Ven: 7.441 — ABNORMAL HIGH (ref 7.25–7.43)
pH, Ven: 7.45 — ABNORMAL HIGH (ref 7.25–7.43)
pH, Ven: 7.476 — ABNORMAL HIGH (ref 7.25–7.43)
pO2, Ven: 24 mmHg — CL (ref 32–45)
pO2, Ven: 25 mmHg — CL (ref 32–45)
pO2, Ven: 30 mmHg — CL (ref 32–45)

## 2022-02-12 LAB — COMPREHENSIVE METABOLIC PANEL
ALT: 362 U/L — ABNORMAL HIGH (ref 0–44)
ALT: 341 U/L — ABNORMAL HIGH (ref 0–44)
AST: 54 U/L — ABNORMAL HIGH (ref 15–41)
AST: 48 U/L — ABNORMAL HIGH (ref 15–41)
Albumin: 3 g/dL — ABNORMAL LOW (ref 3.5–5.0)
Albumin: 3.2 g/dL — ABNORMAL LOW (ref 3.5–5.0)
Alkaline Phosphatase: 121 U/L (ref 38–126)
Alkaline Phosphatase: 105 U/L (ref 38–126)
Anion gap: 8 (ref 5–15)
Anion gap: 9 (ref 5–15)
BUN: 32 mg/dL — ABNORMAL HIGH (ref 8–23)
BUN: 28 mg/dL — ABNORMAL HIGH (ref 8–23)
CO2: 29 mmol/L (ref 22–32)
CO2: 26 mmol/L (ref 22–32)
Calcium: 9.2 mg/dL (ref 8.9–10.3)
Calcium: 9.2 mg/dL (ref 8.9–10.3)
Chloride: 104 mmol/L (ref 98–111)
Chloride: 105 mmol/L (ref 98–111)
Creatinine, Ser: 2.23 mg/dL — ABNORMAL HIGH (ref 0.61–1.24)
Creatinine, Ser: 1.99 mg/dL — ABNORMAL HIGH (ref 0.61–1.24)
GFR, Estimated: 29 mL/min — ABNORMAL LOW (ref >60)
GFR, Estimated: 34 mL/min — ABNORMAL LOW (ref >60)
Glucose, Bld: 114 mg/dL — ABNORMAL HIGH (ref 70–99)
Glucose, Bld: 139 mg/dL — ABNORMAL HIGH (ref 70–99)
Potassium: 4.8 mmol/L (ref 3.5–5.1)
Potassium: 3.4 mmol/L — ABNORMAL LOW (ref 3.5–5.1)
Sodium: 141 mmol/L (ref 135–145)
Sodium: 140 mmol/L (ref 135–145)
Total Bilirubin: 1.3 mg/dL — ABNORMAL HIGH (ref 0.3–1.2)
Total Bilirubin: 1.2 mg/dL (ref 0.3–1.2)
Total Protein: 6 g/dL — ABNORMAL LOW (ref 6.5–8.1)
Total Protein: 6.3 g/dL — ABNORMAL LOW (ref 6.5–8.1)

## 2022-02-12 LAB — POCT I-STAT 7, (LYTES, BLD GAS, ICA,H+H)
Acid-Base Excess: 6 mmol/L — ABNORMAL HIGH (ref 0.0–2.0)
Bicarbonate: 29.2 mmol/L — ABNORMAL HIGH (ref 20.0–28.0)
Calcium, Ion: 1.26 mmol/L (ref 1.15–1.40)
HCT: 29 % — ABNORMAL LOW (ref 39.0–52.0)
Hemoglobin: 9.9 g/dL — ABNORMAL LOW (ref 13.0–17.0)
O2 Saturation: 96 %
Potassium: 3.9 mmol/L (ref 3.5–5.1)
Sodium: 141 mmol/L (ref 135–145)
TCO2: 30 mmol/L (ref 22–32)
pCO2 arterial: 37.4 mmHg (ref 32–48)
pH, Arterial: 7.501 — ABNORMAL HIGH (ref 7.35–7.45)
pO2, Arterial: 74 mmHg — ABNORMAL LOW (ref 83–108)

## 2022-02-12 LAB — CBC
HCT: 26.5 % — ABNORMAL LOW (ref 39.0–52.0)
HCT: 30.4 % — ABNORMAL LOW (ref 39.0–52.0)
Hemoglobin: 8.3 g/dL — ABNORMAL LOW (ref 13.0–17.0)
Hemoglobin: 9.1 g/dL — ABNORMAL LOW (ref 13.0–17.0)
MCH: 30.3 pg (ref 26.0–34.0)
MCH: 28.7 pg (ref 26.0–34.0)
MCHC: 31.3 g/dL (ref 30.0–36.0)
MCHC: 29.9 g/dL — ABNORMAL LOW (ref 30.0–36.0)
MCV: 96.7 fL (ref 80.0–100.0)
MCV: 95.9 fL (ref 80.0–100.0)
Platelets: 197 10*3/uL (ref 150–400)
Platelets: 224 10*3/uL (ref 150–400)
RBC: 2.74 MIL/uL — ABNORMAL LOW (ref 4.22–5.81)
RBC: 3.17 MIL/uL — ABNORMAL LOW (ref 4.22–5.81)
RDW: 19.3 % — ABNORMAL HIGH (ref 11.5–15.5)
RDW: 19.4 % — ABNORMAL HIGH (ref 11.5–15.5)
WBC: 6.8 10*3/uL (ref 4.0–10.5)
WBC: 8.1 10*3/uL (ref 4.0–10.5)
nRBC: 0 % (ref 0.0–0.2)
nRBC: 0 % (ref 0.0–0.2)

## 2022-02-12 LAB — PROTIME-INR
INR: 2.2 — ABNORMAL HIGH (ref 0.8–1.2)
INR: 2 — ABNORMAL HIGH (ref 0.8–1.2)
Prothrombin Time: 24.1 seconds — ABNORMAL HIGH (ref 11.4–15.2)
Prothrombin Time: 22.7 seconds — ABNORMAL HIGH (ref 11.4–15.2)

## 2022-02-12 LAB — LACTATE DEHYDROGENASE
LDH: 197 U/L — ABNORMAL HIGH (ref 98–192)
LDH: 196 U/L — ABNORMAL HIGH (ref 98–192)

## 2022-02-12 SURGERY — RIGHT HEART CATH
Anesthesia: LOCAL

## 2022-02-12 MED ORDER — LIDOCAINE HCL (PF) 1 % IJ SOLN
INTRAMUSCULAR | Status: AC
  Filled 2022-02-12: qty 30

## 2022-02-12 MED ORDER — ACETAMINOPHEN 325 MG PO TABS: 650.0000 mg | ORAL_TABLET | ORAL | Status: DC | PRN

## 2022-02-12 MED ORDER — DOXAZOSIN MESYLATE 1 MG PO TABS
1.0000 mg | ORAL_TABLET | Freq: Two times a day (BID) | ORAL | Status: DC
  Administered 2022-02-12 (×2): 1 mg via ORAL
  Filled 2022-02-12 (×3): qty 1

## 2022-02-12 MED ORDER — HEPARIN (PORCINE) IN NACL 1000-0.9 UT/500ML-% IV SOLN
INTRAVENOUS | Status: DC | PRN
  Administered 2022-02-12: 500 mL

## 2022-02-12 MED ORDER — SODIUM CHLORIDE 0.9% FLUSH
3.0000 mL | Freq: Two times a day (BID) | INTRAVENOUS | Status: DC
  Administered 2022-02-12 – 2022-02-16 (×8): 3 mL via INTRAVENOUS

## 2022-02-12 MED ORDER — HEPARIN (PORCINE) IN NACL 1000-0.9 UT/500ML-% IV SOLN
INTRAVENOUS | Status: AC
  Filled 2022-02-12: qty 500

## 2022-02-12 MED ORDER — POTASSIUM CHLORIDE CRYS ER 20 MEQ PO TBCR
40.0000 meq | EXTENDED_RELEASE_TABLET | Freq: Once | ORAL | Status: AC
  Administered 2022-02-12: 40 meq via ORAL
  Filled 2022-02-12: qty 2

## 2022-02-12 MED ORDER — LIDOCAINE HCL (PF) 1 % IJ SOLN
INTRAMUSCULAR | Status: DC | PRN
  Administered 2022-02-12: 10 mL

## 2022-02-12 MED ORDER — HYDRALAZINE HCL 20 MG/ML IJ SOLN
INTRAMUSCULAR | Status: AC
  Filled 2022-02-12: qty 1

## 2022-02-12 MED ORDER — SODIUM CHLORIDE 0.9 % IV SOLN: 250.0000 mL | INTRAVENOUS | Status: DC | PRN

## 2022-02-12 MED ORDER — HYDRALAZINE HCL 20 MG/ML IJ SOLN
INTRAMUSCULAR | Status: DC | PRN
  Administered 2022-02-12 (×2): 10 mg via INTRAVENOUS

## 2022-02-12 MED ORDER — LABETALOL HCL 5 MG/ML IV SOLN: 10.0000 mg | INTRAVENOUS | Status: AC | PRN

## 2022-02-12 MED ORDER — HYDRALAZINE HCL 20 MG/ML IJ SOLN
10.0000 mg | INTRAMUSCULAR | Status: AC | PRN
Start: 2022-02-12 — End: 2022-02-12

## 2022-02-12 MED ORDER — WARFARIN SODIUM 4 MG PO TABS
4.0000 mg | ORAL_TABLET | Freq: Once | ORAL | Status: AC
Start: 2022-02-12 — End: 2022-02-12
  Administered 2022-02-12: 4 mg via ORAL
  Filled 2022-02-12: qty 1

## 2022-02-12 MED ORDER — SODIUM CHLORIDE 0.9% FLUSH: 3.0000 mL | INTRAVENOUS | Status: DC | PRN

## 2022-02-12 MED ORDER — ONDANSETRON HCL 4 MG/2ML IJ SOLN: 4.0000 mg | Freq: Four times a day (QID) | INTRAMUSCULAR | Status: DC | PRN

## 2022-02-12 SURGICAL SUPPLY — 8 items
CATH SWAN GANZ 7F STRAIGHT (CATHETERS) ×1 IMPLANT
GUIDEWIRE .025 260CM (WIRE) ×1 IMPLANT
PACK CARDIAC CATHETERIZATION (CUSTOM PROCEDURE TRAY) ×2 IMPLANT
PROTECTION STATION PRESSURIZED (MISCELLANEOUS) ×2
SHEATH GLIDE SLENDER 4/5FR (SHEATH) IMPLANT
SHEATH PINNACLE 7F 10CM (SHEATH) ×1 IMPLANT
STATION PROTECTION PRESSURIZED (MISCELLANEOUS) IMPLANT
TRANSDUCER W/STOPCOCK (MISCELLANEOUS) ×2 IMPLANT

## 2022-02-12 NOTE — Interval H&P Note (Signed)
History and Physical Interval Note:  02/12/2022 8:32 AM  Johnny Oneill  has presented today for surgery, with the diagnosis of heart failure.  The various methods of treatment have been discussed with the patient and family. After consideration of risks, benefits and other options for treatment, the patient has consented to  Procedure(s): RIGHT HEART CATH (N/A) as a surgical intervention.  The patient's history has been reviewed, patient examined, no change in status, stable for surgery.  I have reviewed the patient's chart and labs.  Questions were answered to the patient's satisfaction.     [name]

## 2022-02-12 NOTE — Progress Notes (Signed)
Physical Therapy Treatment Patient Details Name: Johnny Oneill MRN: [id number] DOB: Aug 02, 1941 Today's Date: 02/12/2022   History of Present Illness Pt is a 81 y.o. male who presented 02/09/22 with concern for RV failure and shock liver. S/p R cardiac cath 6/15. PMhx: CKD, CHF, HTN, LVAD HM II 2015, HLD, CAD, Lt TKA, ICD, R TKA April 2023    PT Comments    Pt appears to be more stable today, ambulating up to ~400 ft with a quad-cane at a min guard-supervision level. Pt does display some mild instability but no LOB. Will plan to assess his need for a SPC vs quad-cane as pt would carry the quad-cane or barely place it on the ground at times. Will continue to follow acutely. Current recommendations remain appropriate.     Recommendations for follow up therapy are one component of a multi-disciplinary discharge planning process, led by the attending physician.  Recommendations may be updated based on patient status, additional functional criteria and insurance authorization.  Follow Up Recommendations  Outpatient PT     Assistance Recommended at Discharge Intermittent Supervision/Assistance  Patient can return home with the following A little help with bathing/dressing/bathroom;Assistance with cooking/housework;Assist for transportation;Help with stairs or ramp for entrance   Equipment Recommendations  None recommended by PT    Recommendations for Other Services       Precautions / Restrictions Precautions Precautions: Knee;Fall;Other (comment) Precaution Booklet Issued: No Precaution Comments: LVAD, watch HR and BP (hx of orthostatic hypotension), R TKA 12/03/21 Restrictions Weight Bearing Restrictions: No RLE Weight Bearing: Weight bearing as tolerated     Mobility  Bed Mobility Overal bed mobility: Modified Independent Bed Mobility: Supine to Sit     Supine to sit: Modified independent (Device/Increase time), HOB elevated     General bed mobility comments: Mod I for bed  mobility, HOB elevated    Transfers Overall transfer level: Needs assistance Equipment used: Quad cane Transfers: Sit to/from Stand Sit to Stand: Supervision           General transfer comment: Supervision for safety    Ambulation/Gait Ambulation/Gait assistance: Min guard, Supervision Gait Distance (Feet): 400 Feet Assistive device: Quad cane Gait Pattern/deviations: Decreased stride length, Decreased stance time - right, Decreased weight shift to right, Antalgic, Step-through pattern, Trunk flexed, Decreased step length - left Gait velocity: decreased Gait velocity interpretation: <1.8 ft/sec, indicate of risk for recurrent falls   General Gait Details: Pt with slow gait and still displaying a mild antalgic gait pattern, looking L and R to assess his environment as needed without LOB, min guard-supervision for safety   Stairs             Wheelchair Mobility    Modified Rankin (Stroke Patients Only)       Balance Overall balance assessment: Needs assistance Sitting-balance support: No upper extremity supported, Feet supported Sitting balance-Leahy Scale: Good     Standing balance support: Single extremity supported, No upper extremity supported, During functional activity Standing balance-Leahy Scale: Fair Standing balance comment: Able to stand without UE support, benefits from 1 UE support for mobility                            Cognition Arousal/Alertness: Awake/alert Behavior During Therapy: WFL for tasks assessed/performed Overall Cognitive Status: Within Functional Limits for tasks assessed  Exercises      General Comments General comments (skin integrity, edema, etc.): discussed potentially switching to [facility] considering pt only places 1-2 pongs of his quad cane on the ground at a time or carries it occasionally; groin site assessed pre and post mobility with no noted changes;  encouraged pt to perform ROM exercises for R knee      Pertinent Vitals/Pain Pain Assessment Pain Assessment: Faces Faces Pain Scale: Hurts little more Pain Location: R knee Pain Descriptors / Indicators: Discomfort, Guarding Pain Intervention(s): Limited activity within patient's tolerance, Monitored during session, Repositioned    Home Living                          Prior Function            PT Goals (current goals can now be found in the care plan section) Acute Rehab PT Goals Patient Stated Goal: to go home and get better PT Goal Formulation: With patient Time For Goal Achievement: 02/24/22 Potential to Achieve Goals: Good Progress towards PT goals: Progressing toward goals    Frequency    Min 3X/week      PT Plan Current plan remains appropriate    Co-evaluation              AM-PAC PT "6 Clicks" Mobility   Outcome Measure  Help needed turning from your back to your side while in a flat bed without using bedrails?: None Help needed moving from lying on your back to sitting on the side of a flat bed without using bedrails?: None Help needed moving to and from a bed to a chair (including a wheelchair)?: A Little Help needed standing up from a chair using your arms (e.g., wheelchair or bedside chair)?: A Little Help needed to walk in hospital room?: A Little Help needed climbing 3-5 steps with a railing? : A Little 6 Click Score: 20    End of Session   Activity Tolerance: Patient tolerated treatment well Patient left: with call bell/phone within reach;in chair Nurse Communication: Mobility status PT Visit Diagnosis: Other abnormalities of gait and mobility (R26.89);Muscle weakness (generalized) (M62.81);Unsteadiness on feet (R26.81);Difficulty in walking, not elsewhere classified (R26.2);Pain Pain - Right/Left: Right Pain - part of body: Knee     Time: 5625-6389 PT Time Calculation (min) (ACUTE ONLY): 27 min  Charges:  $Gait Training:  23-37 mins                     Moishe Spice, PT, DPT Acute Rehabilitation Services  Office: [phone]    Orvan Falconer 02/12/2022, 2:43 PM

## 2022-02-12 NOTE — Progress Notes (Signed)
LVAD Coordinator Rounding Note:  Direct admit 02/09/22 from home following clinic visit.  Pt admitted with RV failure and shock liver.  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 Darcey [name] under bridge to transplant criteria. Patient was removed from transplant list 05/2016 per his choice. He is currently Destination Therapy LVAD.   Met pt in cath lab this morning for RHC. During cath, speed increased per DR Bensimhon to 9800, started pt on Cardura 1 mg bid  Milrinone 0.125 mcg/kg/min via PIV. Unable to place PICC. INR 2.2 - holding coumadin. BP remains elevated- Florinef has long half life.    Vital signs       Temp: 97.8 HR: 83  Doppler Pressure: 116     Automatic BP: 111/94 (101)   O2 Sat: 95% on RA Wt: 158>149.4>150.7 lbs  LVAD interrogation reveals:  Speed: 9800    Flow: 4.0    Power: 5.6 w   PI: 6.6     Alarms: none yesterday or today Events: none  Fixed speed:  9800 Low speed limit: 90200  Drive Line:  Drive line is being maintained weekly by [name], his wife. Existing VAD dressing clean, dry, and intact. Drive line anchor correctly applied. Weekly dressing changes per [facility] nurse or caregiver. Next dressing change due 02/17/22.   Labs:  LDH trend: [id number]  INR trend: 5.5>3.6>2.5>2  AST/ALT: 537/1003>227/760>105/530>48/341  Anticoagulation Plan: -INR Goal:  2.5 - 3.0 - Currently holding -ASA Dose: 81 mg daily   Device: Medtronic dual - Therapies: on 231 bpm - Pacing:  AAI<=>DDD 80 bpm - Last check: in ED per Medtronic rep on 12/18/21 - rate response turned on   Plan/Recommendations:  Call VAD Coordinator with any VAD equipment or drive line issues. Weekly dressing changes per [facility] nurse or caregiver.   Tanda Rockers RN [place] Coordinator  Office: [phone]  24/7 Pager: [phone]

## 2022-02-12 NOTE — Progress Notes (Signed)
PT Cancellation Note  Patient Details Name: DEVERE BREM MRN: [id number] DOB: 1941-02-16   Cancelled Treatment:    Reason Eval/Treat Not Completed: Patient at procedure or test/unavailable. Will plan to follow-up later as time permits.   Moishe Spice, PT, DPT Acute Rehabilitation Services  Office: [place] 02/12/2022, 8:34 AM

## 2022-02-12 NOTE — Plan of Care (Signed)
  Problem: Education: Goal: Understanding of CV disease, CV risk reduction, and recovery process will improve Outcome: Progressing Goal: Individualized Educational Video(s) Outcome: Progressing   Problem: Activity: Goal: Ability to return to baseline activity level will improve Outcome: Progressing   Problem: Cardiovascular: Goal: Ability to achieve and maintain adequate cardiovascular perfusion will improve Outcome: Progressing Goal: Vascular access site(s) Level 0-1 will be maintained Outcome: Progressing   Problem: Health Behavior/Discharge Planning: Goal: Ability to safely manage health-related needs after discharge will improve Outcome: Progressing   Problem: Education: Goal: Knowledge of General Education information will improve Description: Including pain rating scale, medication(s)/side effects and non-pharmacologic comfort measures Outcome: Progressing   Problem: Health Behavior/Discharge Planning: Goal: Ability to manage health-related needs will improve Outcome: Progressing   Problem: Clinical Measurements: Goal: Ability to maintain clinical measurements within normal limits will improve Outcome: Progressing Goal: Will remain free from infection Outcome: Progressing Goal: Diagnostic test results will improve Outcome: Progressing Goal: Respiratory complications will improve Outcome: Progressing Goal: Cardiovascular complication will be avoided Outcome: Progressing   Problem: Activity: Goal: Risk for activity intolerance will decrease Outcome: Progressing   Problem: Nutrition: Goal: Adequate nutrition will be maintained Outcome: Progressing   Problem: Coping: Goal: Level of anxiety will decrease Outcome: Progressing   Problem: Elimination: Goal: Will not experience complications related to bowel motility Outcome: Progressing Goal: Will not experience complications related to urinary retention Outcome: Progressing   Problem: Pain Managment: Goal:  General experience of comfort will improve Outcome: Progressing   Problem: Safety: Goal: Ability to remain free from injury will improve Outcome: Progressing   Problem: Skin Integrity: Goal: Risk for impaired skin integrity will decrease Outcome: Progressing   Problem: Education: Goal: Patient will understand all VAD equipment and how it functions Outcome: Progressing Goal: Patient will be able to verbalize current INR target range and antiplatelet therapy for discharge home Outcome: Progressing

## 2022-02-12 NOTE — H&P (View-Only) (Signed)
Advanced Heart Failure VAD Team Note  PCP-Cardiologist: Dr. Haroldine Laws   Subjective:     6/12: Direct admit for inotropic support given concern for RV failure and shock liver. Started on milrinone. Given PO Vit K (INR 5.5)  6/12: 18 beat run of NSVT (K 2.7, Mg 1.9). Several low flow alarms on VAD  6/13 Florinef stopped.   On empiric milrinone 0.125. AST 537>>227>>108>>54 ALT 1003>>760 >>530>>362   INR 5.5>>3.6>>2.5 >>2.2 Hgb 8.6 No gross bleeding  LDH 450>>287 >>242>>197  SCr 2.41>>2.37 >>2.3>>2.2   PICC team unable to place PICC.   Denies SOB. Walked 2 times.   LVAD INTERROGATION:  HeartMate II LVAD:   Flow 4.1  liters/min, speed 9600, power 5.0, PI 6.8.. No PI events.  Objective:    Vital Signs:   Temp:  [97.5 F (36.4 C)-98.3 F (36.8 C)] 97.6 F (36.4 C) (06/15 0406) Pulse Rate:  [79-88] 80 (06/15 0406) Resp:  [12-19] 12 (06/15 0406) BP: (111-125)/(93-105) 123/103 (06/15 0406) SpO2:  [91 %-98 %] 98 % (06/15 0406) Weight:  [68.4 kg] 68.4 kg (06/15 0633) Last BM Date : 02/10/22 Mean arterial Pressure 110s  Intake/Output:   Intake/Output Summary (Last 24 hours) at 02/12/2022 0718 Last data filed at 02/12/2022 4401 Gross per 24 hour  Intake --  Output 675 ml  Net -675 ml     Physical Exam   Physical Exam: GENERAL: No acute distress. HEENT: normal  NECK: Supple, JVP  6-7 .  2+ bilaterally, no bruits.  No lymphadenopathy or thyromegaly appreciated.   CARDIAC:  Mechanical heart sounds with LVAD hum present.  LUNGS:  Clear to auscultation bilaterally.  ABDOMEN:  Soft, round, nontender, positive bowel sounds x4.     LVAD exit site: well-healed and incorporated.  Dressing dry and intact.  No erythema or drainage.  Stabilization device present and accurately applied.  Driveline dressing is being changed daily per sterile technique. EXTREMITIES:  Warm and dry, no cyanosis, clubbing, rash or edema  NEUROLOGIC:  Alert and oriented x 3.    No aphasia.  No  dysarthria.  Affect pleasant.      Telemetry  A-V paced 80s  EKG    No new EKG to review   Labs   Basic Metabolic Panel: Recent Labs  Lab 02/09/22 0929 02/10/22 0258 02/10/22 0612 02/11/22 0252 02/11/22 1936 02/12/22 0103  NA 139  --  140 140 140 141  K 2.7* 4.0 4.5 3.1* 3.5 4.8  CL 105  --  104 101 104 104  CO2 25  --  25 29 28 29   GLUCOSE 121*  --  116* 104* 124* 114*  BUN 29*  --  31* 29* 31* 32*  CREATININE 2.41*  --  2.37* 2.30* 2.20* 2.23*  CALCIUM 8.7*  --  9.3 8.9 8.9 9.2  MG 1.9 1.9  --   --   --   --     Liver Function Tests: Recent Labs  Lab 02/09/22 0929 02/10/22 0612 02/11/22 0252 02/12/22 0103  AST 537* 227* 108* 54*  ALT 1,003* 760* 530* 362*  ALKPHOS 136* 124 117 121  BILITOT 1.4* 1.4* 1.4* 1.3*  PROT 6.6 6.3* 6.1* 6.0*  ALBUMIN 3.4* 3.2* 3.2* 3.0*   No results for input(s): "LIPASE", "AMYLASE" in the last 168 hours. No results for input(s): "AMMONIA" in the last 168 hours.  CBC: Recent Labs  Lab 02/09/22 0929 02/10/22 0612 02/11/22 0252 02/11/22 1936 02/12/22 0103  WBC 6.9 6.3 6.3 6.0 6.8  HGB 8.8*  8.5* 8.6* 8.5* 8.3*  HCT 29.2* 27.2* 27.4* 27.4* 26.5*  MCV 97.3 96.1 94.8 96.5 96.7  PLT 230 202 205 189 197    INR: Recent Labs  Lab 02/09/22 0929 02/10/22 0612 02/11/22 0252 02/11/22 1936 02/12/22 0103  INR 5.5* 3.6* 2.5* 2.1* 2.2*    Other results: EKG:    Imaging   No results found.   Medications:     Scheduled Medications:  [START ON 02/13/2022] aspirin EC  81 mg Oral Daily   citalopram  10 mg Oral q morning   feeding supplement  237 mL Oral BID BM   Gerhardt's butt cream   Topical Daily   levothyroxine  175 mcg Oral Once per day on Mon Tue Wed Thu Fri Sat   [START ON 02/15/2022] levothyroxine  87.5 mcg Oral Once per day on Sun   mexiletine  150 mg Oral BID   multivitamin with minerals  1 tablet Oral Daily   pantoprazole  40 mg Oral Daily   sodium chloride flush  3 mL Intravenous Q12H   sotalol  80 mg Oral  Daily   Warfarin - Pharmacist Dosing Inpatient   Does not apply q1600    Infusions:  sodium chloride 10 mL/hr at 02/10/22 0300   sodium chloride     sodium chloride 10 mL/hr at 02/11/22 1955   milrinone 0.125 mcg/kg/min (02/10/22 2043)    PRN Medications: sodium chloride, sodium chloride, acetaminophen, melatonin, sodium chloride flush   Patient Profile   81 y/o male w/ CAD, 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   - Echo 3/18 EF 10-15% mild AI. Mod RV dysfunction. LVAD cannula OK - Echo 4/23 EF 20-25%, RV mod reduced  - has struggled with RV failure however RHC 11/22 looked good (despite evidence of RVF on LE dopplers showing reflux - Now NYHA IV. Has not bounced back after his knee surgery. + weakness & anorexia. Concern for RV failure. Labs c/w shock  - PICC team unable to place PICC.  - On empiric milrinone 0.125.  - Plan RHC today.  - GDMT limited by orthostatic hypotension.  - Failed Jardiance due to volume depletion - He has not tolerated ACE-I or amlodipine or b-blocker in the past.   2) Weakness/anorexia - Suspect due to RV failure/ shock.  - Plan per above .  - Will plan RHC tomorrow.    3) LVAD: HMII 08/2013 for DT. - VAD interrogated personally. Parameters stable. - Admitted in May 2018 with elevated LDH with bival.  - LDH trending down 197 today  - INR elevated INR high on admit. Suspect RV failure.  - INR 2.2 today.  Holding Coumadin. Continue ASA  - DL site ok - MAPs 100s   4) Orthostatic hypotension, post-op - Resolved. 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  - Off amio due to cerebellar side effects.  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 - VT  06/16/21 with  DC-CV x 1 - Recurrent VT  12/28/21 after stopping sotalol. Sotalol restarted. QT ok.  - PVCs improved on mexilitene. Continue mexilitene 150 bid - Keep K > 4.0 Mg > 2.0. Supp K  - Continue to f/u with EP. Options  limited   6) Atrial flutter, recurrent - patient paced out of AFL in 6/22. Atrial therapies enabled  - continue sotalol (on for VT). QTs slightly prolonged but stable - SR today.    7) Chronic anticoagulation: INR goal 2.0-3.0.    - INR 5.5 on admit.  Suspect RV failure  - INR down w/ Vit K, 2.2 today  - Hold coumadin. Give Ensure - Continue ASA 81 mg for LVAD   8) HTN:  - Florinef discontinued. Long half-life.  - Milrinone per above  - Flornief has long half life. Maps 100s  - h/o orthostatic hypotension. No further   9) Hypothyroidism:  - on levothyroxine. TFTs ok  - Followed by Dr Dwyane Dee.    10) Hyperlipidemia:  - Continue Crestor. Zetia stopped due to cost.  - No change.    11) CAD - no s/s ischemia - continue Crestor   12) AKI on CKD IV - Creatinine baseline 1.7-2.0 - SCr 2.3 in setting of low output/cardiorenal  - Creatinine 2.23 - Milrinone support per above   - failed Jardiance - Has Nephrology referral   13) Chronic Anemia  - Hgb 8.3 (stable)    14) Shock Liver - Suspect 2/2 RV failure  - Improving w/ milrinone  - AST 537>>227>>108>>54 - ALT 1003>>760 >>530 >>362 - follow CMP   RHC today   I reviewed the LVAD parameters from today, and compared the results to the patient's prior recorded data.  No programming changes were made.  The LVAD is functioning within specified parameters.  The patient performs LVAD self-test daily.  LVAD interrogation was negative for any significant power changes, alarms or PI events/speed drops.  LVAD equipment check completed and is in good working order.  Back-up equipment present.   LVAD education done on emergency procedures and precautions and reviewed exit site care.  Length of Stay: 3  Amy Clegg, NP 02/12/2022, 7:18 AM  VAD Team ---  VAD ISSUES ONLY--- Pager [phone] (7am - 7am)  Advanced Heart Failure Team  Pager [phone] (M-F; 7a - 5p)  Please contact [facility] for night-coverage after hours (5p -7a ) and weekends on [email or web address]  Patient seen and examined with the above-signed Advanced Practice Provider and/or Housestaff. I personally reviewed laboratory data, imaging studies and relevant notes. I independently examined the patient and formulated the important aspects of the plan. I have edited the note to reflect any of my changes or salient points. I have personally discussed the plan with the patient and/or family.  Remains on milrinone. Feels ok. Flows still a bit low on VAD. Denies CP or SOB. LFTs coming down.   General:  NAD.  HEENT: normal  Neck: supple. JVP not elevated.  Carotids 2+ bilat; no bruits. No lymphadenopathy or thryomegaly appreciated. Cor: LVAD hum.  Lungs: Clear. Abdomen: soft, nontender, non-distended. No hepatosplenomegaly. No bruits or masses. Good bowel sounds. Driveline site clean. Anchor in place.  Extremities: no cyanosis, clubbing, rash. Warm no edema  Neuro: alert & oriented x 3. No focal deficits. Moves all 4 without problem   He has severe RV failure. Improved on low-dose milrinone. VT fortunately has been quiescent. Suspect he will need home milrinone. MAPs still elevated.   Plan RHC today. VAD interrogated personally. Parameters stable.  Glori Bickers, MD  8:30 AM

## 2022-02-12 NOTE — Progress Notes (Addendum)
Johnny Oneill for warfarin Indication:  LVAD  Allergies  Allergen Reactions   Ace Inhibitors Other (See Comments)    Hypotension and elevated creatinine   Amiodarone Other (See Comments)    Cerebellar side effects   Diovan [Valsartan] Other (See Comments)    Hypotension at low dose, hyperkalemia   Lipitor [Atorvastatin Calcium] Other (See Comments)    Muscle pain   Jardiance [Empagliflozin] Other (See Comments)    Unsure of exact side effect   Norvasc [Amlodipine] Other (See Comments)    Put patient to sleep    Patient Measurements: Height: 5\' 7"  (170.2 cm) Weight: 68.4 kg (150 lb 12.7 oz) IBW/kg (Calculated) : 66.1  Vital Signs: Temp: 97.8 F (36.6 C) (06/15 1021) Temp Source: Oral (06/15 1021) BP: 111/94 (06/15 1103) Pulse Rate: 86 (06/15 1103)  Labs: Recent Labs    02/11/22 1936 02/12/22 0103 02/12/22 1042  HGB 8.5* 8.3* 9.1*  HCT 27.4* 26.5* 30.4*  PLT 189 197 224  LABPROT 23.8* 24.1* 22.7*  INR 2.1* 2.2* 2.0*  CREATININE 2.20* 2.23* 1.99*     Estimated Creatinine Clearance: 28.6 mL/min (A) (by C-G formula based on SCr of 1.99 mg/dL (H)).   Medical History: Past Medical History:  Diagnosis Date   Anxiety    Automatic implantable cardioverter-defibrillator in situ    CAD (coronary artery disease)    S/P stenting of LCX in 2004;  s/p Lat MI 2009 with occlusion of the LCX - treated with Promus stenting. Has total occlusion of the RCA.    CHF (congestive heart failure) (HCC)    Hypercholesteremia    Hypertension    Hypothyroidism    ICD (implantable cardiac defibrillator) in place    PROPHYLACTIC      medtronic   Inferior MI ([place]) "? date"; 2009   Ischemic cardiomyopathy    a. 10/09 Echo: Sev LV Dysfxn, inf/lat AK, Mod MR.   Liver disease    LVAD (left ventricular assist device) present ([place])    Osteoarthritis    a. s/p R TKR   Pacemaker    PVC (premature ventricular contraction)    RBBB    Shortness of breath      Assessment: 81 yo male on chronic Coumadin for LVAD admitted with supratherapeutic INR. INR has been trending down after small dose of vitamin K on 6/12. Low dose warfarin restarted yesterday 6/14.  INR continues to be in range today at 2.    Of note, his INR goal is usually higher (2.5-3) due to elevations in LDH in the past. LDH currently down to 196.   Goal of Therapy:  INR goal 2.5-3 Monitor platelets by anticoagulation protocol: Yes   Plan:  Warfarin 4 mg po x 1 tonight Daily INR.  Erin Hearing PharmD., BCPS Clinical Pharmacist 02/12/2022 1:00 PM

## 2022-02-12 NOTE — Progress Notes (Addendum)
Advanced Heart Failure VAD Team Note  PCP-Cardiologist: Dr. Haroldine Laws   Subjective:     6/12: Direct admit for inotropic support given concern for RV failure and shock liver. Started on milrinone. Given PO Vit K (INR 5.5)  6/12: 18 beat run of NSVT (K 2.7, Mg 1.9). Several low flow alarms on VAD  6/13 Florinef stopped.   On empiric milrinone 0.125. AST 537>>227>>108>>54 ALT 1003>>760 >>530>>362   INR 5.5>>3.6>>2.5 >>2.2 Hgb 8.6 No gross bleeding  LDH 450>>287 >>242>>197  SCr 2.41>>2.37 >>2.3>>2.2   PICC team unable to place PICC.   Denies SOB. Walked 2 times.   LVAD INTERROGATION:  HeartMate II LVAD:   Flow 4.1  liters/min, speed 9600, power 5.0, PI 6.8.. No PI events.  Objective:    Vital Signs:   Temp:  [97.5 F (36.4 C)-98.3 F (36.8 C)] 97.6 F (36.4 C) (06/15 0406) Pulse Rate:  [79-88] 80 (06/15 0406) Resp:  [12-19] 12 (06/15 0406) BP: (111-125)/(93-105) 123/103 (06/15 0406) SpO2:  [91 %-98 %] 98 % (06/15 0406) Weight:  [68.4 kg] 68.4 kg (06/15 0633) Last BM Date : 02/10/22 Mean arterial Pressure 110s  Intake/Output:   Intake/Output Summary (Last 24 hours) at 02/12/2022 0718 Last data filed at 02/12/2022 2119 Gross per 24 hour  Intake --  Output 675 ml  Net -675 ml     Physical Exam   Physical Exam: GENERAL: No acute distress. HEENT: normal  NECK: Supple, JVP  6-7 .  2+ bilaterally, no bruits.  No lymphadenopathy or thyromegaly appreciated.   CARDIAC:  Mechanical heart sounds with LVAD hum present.  LUNGS:  Clear to auscultation bilaterally.  ABDOMEN:  Soft, round, nontender, positive bowel sounds x4.     LVAD exit site: well-healed and incorporated.  Dressing dry and intact.  No erythema or drainage.  Stabilization device present and accurately applied.  Driveline dressing is being changed daily per sterile technique. EXTREMITIES:  Warm and dry, no cyanosis, clubbing, rash or edema  NEUROLOGIC:  Alert and oriented x 3.    No aphasia.  No  dysarthria.  Affect pleasant.      Telemetry  A-V paced 80s  EKG    No new EKG to review   Labs   Basic Metabolic Panel: Recent Labs  Lab 02/09/22 0929 02/10/22 0258 02/10/22 0612 02/11/22 0252 02/11/22 1936 02/12/22 0103  NA 139  --  140 140 140 141  K 2.7* 4.0 4.5 3.1* 3.5 4.8  CL 105  --  104 101 104 104  CO2 25  --  25 29 28 29   GLUCOSE 121*  --  116* 104* 124* 114*  BUN 29*  --  31* 29* 31* 32*  CREATININE 2.41*  --  2.37* 2.30* 2.20* 2.23*  CALCIUM 8.7*  --  9.3 8.9 8.9 9.2  MG 1.9 1.9  --   --   --   --     Liver Function Tests: Recent Labs  Lab 02/09/22 0929 02/10/22 0612 02/11/22 0252 02/12/22 0103  AST 537* 227* 108* 54*  ALT 1,003* 760* 530* 362*  ALKPHOS 136* 124 117 121  BILITOT 1.4* 1.4* 1.4* 1.3*  PROT 6.6 6.3* 6.1* 6.0*  ALBUMIN 3.4* 3.2* 3.2* 3.0*   No results for input(s): "LIPASE", "AMYLASE" in the last 168 hours. No results for input(s): "AMMONIA" in the last 168 hours.  CBC: Recent Labs  Lab 02/09/22 0929 02/10/22 0612 02/11/22 0252 02/11/22 1936 02/12/22 0103  WBC 6.9 6.3 6.3 6.0 6.8  HGB 8.8*  8.5* 8.6* 8.5* 8.3*  HCT 29.2* 27.2* 27.4* 27.4* 26.5*  MCV 97.3 96.1 94.8 96.5 96.7  PLT 230 202 205 189 197    INR: Recent Labs  Lab 02/09/22 0929 02/10/22 0612 02/11/22 0252 02/11/22 1936 02/12/22 0103  INR 5.5* 3.6* 2.5* 2.1* 2.2*    Other results: EKG:    Imaging   No results found.   Medications:     Scheduled Medications:  [START ON 02/13/2022] aspirin EC  81 mg Oral Daily   citalopram  10 mg Oral q morning   feeding supplement  237 mL Oral BID BM   Gerhardt's butt cream   Topical Daily   levothyroxine  175 mcg Oral Once per day on Mon Tue Wed Thu Fri Sat   [START ON 02/15/2022] levothyroxine  87.5 mcg Oral Once per day on Sun   mexiletine  150 mg Oral BID   multivitamin with minerals  1 tablet Oral Daily   pantoprazole  40 mg Oral Daily   sodium chloride flush  3 mL Intravenous Q12H   sotalol  80 mg Oral  Daily   Warfarin - Pharmacist Dosing Inpatient   Does not apply q1600    Infusions:  sodium chloride 10 mL/hr at 02/10/22 0300   sodium chloride     sodium chloride 10 mL/hr at 02/11/22 1955   milrinone 0.125 mcg/kg/min (02/10/22 2043)    PRN Medications: sodium chloride, sodium chloride, acetaminophen, melatonin, sodium chloride flush   Patient Profile   81 y/o male w/ CAD, 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   - Echo 3/18 EF 10-15% mild AI. Mod RV dysfunction. LVAD cannula OK - Echo 4/23 EF 20-25%, RV mod reduced  - has struggled with RV failure however RHC 11/22 looked good (despite evidence of RVF on LE dopplers showing reflux - Now NYHA IV. Has not bounced back after his knee surgery. + weakness & anorexia. Concern for RV failure. Labs c/w shock  - PICC team unable to place PICC.  - On empiric milrinone 0.125.  - Plan RHC today.  - GDMT limited by orthostatic hypotension.  - Failed Jardiance due to volume depletion - He has not tolerated ACE-I or amlodipine or b-blocker in the past.   2) Weakness/anorexia - Suspect due to RV failure/ shock.  - Plan per above .  - Will plan RHC tomorrow.    3) LVAD: HMII 08/2013 for DT. - VAD interrogated personally. Parameters stable. - Admitted in May 2018 with elevated LDH with bival.  - LDH trending down 197 today  - INR elevated INR high on admit. Suspect RV failure.  - INR 2.2 today.  Holding Coumadin. Continue ASA  - DL site ok - MAPs 100s   4) Orthostatic hypotension, post-op - Resolved. 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  - Off amio due to cerebellar side effects.  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 - VT  06/16/21 with  DC-CV x 1 - Recurrent VT  12/28/21 after stopping sotalol. Sotalol restarted. QT ok.  - PVCs improved on mexilitene. Continue mexilitene 150 bid - Keep K > 4.0 Mg > 2.0. Supp K  - Continue to f/u with EP. Options  limited   6) Atrial flutter, recurrent - patient paced out of AFL in 6/22. Atrial therapies enabled  - continue sotalol (on for VT). QTs slightly prolonged but stable - SR today.    7) Chronic anticoagulation: INR goal 2.0-3.0.    - INR 5.5 on admit.  Suspect RV failure  - INR down w/ Vit K, 2.2 today  - Hold coumadin. Give Ensure - Continue ASA 81 mg for LVAD   8) HTN:  - Florinef discontinued. Long half-life.  - Milrinone per above  - Flornief has long half life. Maps 100s  - h/o orthostatic hypotension. No further   9) Hypothyroidism:  - on levothyroxine. TFTs ok  - Followed by Dr Dwyane Dee.    10) Hyperlipidemia:  - Continue Crestor. Zetia stopped due to cost.  - No change.    11) CAD - no s/s ischemia - continue Crestor   12) AKI on CKD IV - Creatinine baseline 1.7-2.0 - SCr 2.3 in setting of low output/cardiorenal  - Creatinine 2.23 - Milrinone support per above   - failed Jardiance - Has Nephrology referral   13) Chronic Anemia  - Hgb 8.3 (stable)    14) Shock Liver - Suspect 2/2 RV failure  - Improving w/ milrinone  - AST 537>>227>>108>>54 - ALT 1003>>760 >>530 >>362 - follow CMP   RHC today   I reviewed the LVAD parameters from today, and compared the results to the patient's prior recorded data.  No programming changes were made.  The LVAD is functioning within specified parameters.  The patient performs LVAD self-test daily.  LVAD interrogation was negative for any significant power changes, alarms or PI events/speed drops.  LVAD equipment check completed and is in good working order.  Back-up equipment present.   LVAD education done on emergency procedures and precautions and reviewed exit site care.  Length of Stay: 3  Amy Clegg, NP 02/12/2022, 7:18 AM  VAD Team ---  VAD ISSUES ONLY--- Pager [phone] (7am - 7am)  Advanced Heart Failure Team  Pager [phone] (M-F; 7a - 5p)  Please contact [facility] for night-coverage after hours (5p -7a ) and weekends on [email or web address]  Patient seen and examined with the above-signed Advanced Practice Provider and/or Housestaff. I personally reviewed laboratory data, imaging studies and relevant notes. I independently examined the patient and formulated the important aspects of the plan. I have edited the note to reflect any of my changes or salient points. I have personally discussed the plan with the patient and/or family.  Remains on milrinone. Feels ok. Flows still a bit low on VAD. Denies CP or SOB. LFTs coming down.   General:  NAD.  HEENT: normal  Neck: supple. JVP not elevated.  Carotids 2+ bilat; no bruits. No lymphadenopathy or thryomegaly appreciated. Cor: LVAD hum.  Lungs: Clear. Abdomen: soft, nontender, non-distended. No hepatosplenomegaly. No bruits or masses. Good bowel sounds. Driveline site clean. Anchor in place.  Extremities: no cyanosis, clubbing, rash. Warm no edema  Neuro: alert & oriented x 3. No focal deficits. Moves all 4 without problem   He has severe RV failure. Improved on low-dose milrinone. VT fortunately has been quiescent. Suspect he will need home milrinone. MAPs still elevated.   Plan RHC today. VAD interrogated personally. Parameters stable.  Glori Bickers, MD  8:30 AM

## 2022-02-12 NOTE — TOC Initial Note (Addendum)
Transition of Care [facility]) - Initial/Assessment Note    Patient Details  Name: Johnny Oneill MRN: [id number] Date of Birth: [birth year]  [facility] CM/SW Contact:    Erenest Rasher, RN Phone Number: [phone] 02/12/2022, 4:27 PM  Clinical Narrative:                 HF TOC CM spoke to pt at bedside. Offered choice for [facility]. Pt states he has [facility] in the past for [facility]. Pt may need IV Milrinone at dc. Pt states wife at home to assist with care. Contacted [facility] rep, Marjory Lies and pt is active. Will need HH RN and PT orders with [facility]2F.   Expected Discharge Plan: [place] Barriers to Discharge: Continued Medical Work up   Patient Goals and CMS Choice Patient states their goals for this hospitalization and ongoing recovery are:: wants to remain independent CMS Medicare.gov Compare Post Acute Care list provided to:: Patient Choice offered to / list presented to : Patient  Expected Discharge Plan and Services Expected Discharge Plan: [place]   Discharge Planning Services: CM Consult Post Acute Care Choice: [place] arrangements for the past 2 months: Single Family Home   Prior Living Arrangements/Services Living arrangements for the past 2 months: Single Family Home Lives with:: Spouse Patient language and need for interpreter reviewed:: Yes Do you feel safe going back to the place where you live?: Yes      Need for Family Participation in Patient Care: Yes (Comment) Care giver support system in place?: Yes (comment) Current home services: DME ([employer], [employer]) Criminal Activity/Legal Involvement Pertinent to Current Situation/Hospitalization: No - Comment as needed  Activities of Daily Living Home Assistive Devices/Equipment: Cane (specify quad or straight), Walker (specify type) ADL Screening (condition at time of admission) Patient's cognitive ability adequate to safely complete daily activities?:  Yes Is the patient deaf or have difficulty hearing?: No Does the patient have difficulty seeing, even when wearing glasses/contacts?: No Does the patient have difficulty concentrating, remembering, or making decisions?: No Patient able to express need for assistance with ADLs?: Yes Does the patient have difficulty dressing or bathing?: No Independently performs ADLs?: Yes (appropriate for developmental age) Does the patient have difficulty walking or climbing stairs?: No Weakness of Legs: Right Weakness of Arms/Hands: None  Permission Sought/Granted Permission sought to share information with : Case Manager, Family Supports, PCP Permission granted to share information with : Yes, Verbal Permission Granted  Share Information with NAME: Bailey Kolbe  Permission granted to share info w AGENCY: [place] granted to share info w Relationship: wife  Permission granted to share info w Contact Information: [phone]  Emotional Assessment Appearance:: Appears stated age Attitude/Demeanor/Rapport: Engaged Affect (typically observed): Accepting Orientation: : Oriented to Self, Oriented to Place, Oriented to  Time, Oriented to Situation   Psych Involvement: No (comment)  Admission diagnosis:  Acute on chronic right-sided heart failure ([place]) [I50.813] Patient Active Problem List   Diagnosis Date Noted   Malnutrition of moderate degree 02/10/2022   Acute on chronic right-sided heart failure ([place]) 02/09/2022   Syncope and collapse 12/18/2021   CKD (chronic kidney disease), stage III ([place]) 59/56/3875   Chronic systolic CHF (congestive heart failure) ([place]) 01/02/2015   Decreased appetite 11/01/2014   Contact dermatitis 09/11/2014   Inguinal hernia 08/21/2014   Hemothorax 02/08/2014   HTN (hypertension) 01/30/2014   Chronic anticoagulation 01/08/2014   Hypothyroid 12/20/2013  Syncope 11/28/2013   Hemothorax on right 11/06/2013   Poor venous access 11/02/2013   LVAD (left  ventricular assist device) present ([place]) 09/18/2013   Weakness generalized 09/15/2013   Nonspecific (abnormal) findings on radiological and other examination of gastrointestinal tract 09/14/2013   Anemia, unspecified 09/14/2013   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   PVC (premature ventricular contraction) 12/08/2012   VT (ventricular tachycardia) 12/08/2012   Essential hypertension, benign 11/12/2010   ISCHEMIC CARDIOMYOPATHY 90/24/0973   Chronic systolic heart failure ([place]) 11/12/2010   Ischemic cardiomyopathy    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

## 2022-02-13 DIAGNOSIS — I50813 Acute on chronic right heart failure: Secondary | ICD-10-CM | POA: Diagnosis not present

## 2022-02-13 LAB — COMPREHENSIVE METABOLIC PANEL
ALT: 246 U/L — ABNORMAL HIGH (ref 0–44)
AST: 35 U/L (ref 15–41)
Albumin: 2.7 g/dL — ABNORMAL LOW (ref 3.5–5.0)
Alkaline Phosphatase: 111 U/L (ref 38–126)
Anion gap: 8 (ref 5–15)
BUN: 30 mg/dL — ABNORMAL HIGH (ref 8–23)
CO2: 24 mmol/L (ref 22–32)
Calcium: 8.9 mg/dL (ref 8.9–10.3)
Chloride: 107 mmol/L (ref 98–111)
Creatinine, Ser: 1.91 mg/dL — ABNORMAL HIGH (ref 0.61–1.24)
GFR, Estimated: 35 mL/min — ABNORMAL LOW (ref >60)
Glucose, Bld: 122 mg/dL — ABNORMAL HIGH (ref 70–99)
Potassium: 4.1 mmol/L (ref 3.5–5.1)
Sodium: 139 mmol/L (ref 135–145)
Total Bilirubin: 0.9 mg/dL (ref 0.3–1.2)
Total Protein: 5.4 g/dL — ABNORMAL LOW (ref 6.5–8.1)

## 2022-02-13 LAB — CBC
HCT: 26.3 % — ABNORMAL LOW (ref 39.0–52.0)
Hemoglobin: 7.9 g/dL — ABNORMAL LOW (ref 13.0–17.0)
MCH: 29 pg (ref 26.0–34.0)
MCHC: 30 g/dL (ref 30.0–36.0)
MCV: 96.7 fL (ref 80.0–100.0)
Platelets: 184 10*3/uL (ref 150–400)
RBC: 2.72 MIL/uL — ABNORMAL LOW (ref 4.22–5.81)
RDW: 19.7 % — ABNORMAL HIGH (ref 11.5–15.5)
WBC: 5.1 10*3/uL (ref 4.0–10.5)
nRBC: 0 % (ref 0.0–0.2)

## 2022-02-13 LAB — LACTATE DEHYDROGENASE: LDH: 210 U/L — ABNORMAL HIGH (ref 98–192)

## 2022-02-13 LAB — PROTIME-INR
INR: 2.1 — ABNORMAL HIGH (ref 0.8–1.2)
Prothrombin Time: 23.1 seconds — ABNORMAL HIGH (ref 11.4–15.2)

## 2022-02-13 LAB — HEMOGLOBIN AND HEMATOCRIT, BLOOD
HCT: 29.8 % — ABNORMAL LOW (ref 39.0–52.0)
Hemoglobin: 9.1 g/dL — ABNORMAL LOW (ref 13.0–17.0)

## 2022-02-13 LAB — MAGNESIUM: Magnesium: 1.8 mg/dL (ref 1.7–2.4)

## 2022-02-13 LAB — PREPARE RBC (CROSSMATCH)

## 2022-02-13 MED ORDER — WARFARIN SODIUM 4 MG PO TABS
4.0000 mg | ORAL_TABLET | Freq: Once | ORAL | Status: AC
  Administered 2022-02-13: 4 mg via ORAL
  Filled 2022-02-13: qty 1

## 2022-02-13 MED ORDER — SODIUM CHLORIDE 0.9% IV SOLUTION: Freq: Once | INTRAVENOUS | Status: AC

## 2022-02-13 MED ORDER — MAGNESIUM SULFATE 2 GM/50ML IV SOLN
2.0000 g | Freq: Once | INTRAVENOUS | Status: AC
  Administered 2022-02-13: 2 g via INTRAVENOUS
  Filled 2022-02-13: qty 50

## 2022-02-13 MED ORDER — DOXAZOSIN MESYLATE 4 MG PO TABS
2.0000 mg | ORAL_TABLET | Freq: Two times a day (BID) | ORAL | Status: DC
  Administered 2022-02-13 – 2022-02-14 (×3): 2 mg via ORAL
  Filled 2022-02-13 (×2): qty 1

## 2022-02-13 NOTE — Progress Notes (Signed)
Mobility Specialist: Progress Note   02/13/22 1035  Mobility  Activity Ambulated with assistance in hallway  Level of Assistance Standby assist, set-up cues, supervision of patient - no hands on  Assistive Device Cane  RLE Weight Bearing WBAT  Distance Ambulated (ft) 400 ft  Activity Response Tolerated well  $Mobility charge 1 Mobility   Pre-Mobility: 82 HR Post-Mobility: 92 HR, 98% SpO2  Received pt in chair having no complaints and agreeable to mobility. Pt was asymptomatic throughout ambulation and returned to room w/o fault. Left in bed w/ call bell in reach and all needs met.  [facility] Johnny Oneill Mobility Specialist Mobility Specialist [address]: [phone]

## 2022-02-13 NOTE — Progress Notes (Signed)
Occupational Therapy Treatment Patient Details Name: Johnny Oneill MRN: [id number] DOB: 1941-08-29 Today's Date: 02/13/2022   History of present illness Pt is a 81 y.o. male who presented 02/09/22 with concern for RV failure and shock liver. S/p R cardiac cath 6/15. PMhx: CKD, CHF, HTN, LVAD HM II 2015, HLD, CAD, Lt TKA, ICD, R TKA April 2023   OT comments  Patient received in bed and agreeable to OT session. Patient able to get to EOB without assistance and supervision to ambulate to recliner with quad cane. Patient provided handout for energy conservation strategies and reviewed with patient.  Patient states he has already been implementing some strategies; bath seat, chair in kitchen, and use of fanny pack. Discussed fall prevention strategies and patient was able to return understanding Acute OT to continue to follow.    Recommendations for follow up therapy are one component of a multi-disciplinary discharge planning process, led by the attending physician.  Recommendations may be updated based on patient status, additional functional criteria and insurance authorization.    Follow Up Recommendations  No OT follow up    Assistance Recommended at Discharge Intermittent Supervision/Assistance  Patient can return home with the following  A little help with walking and/or transfers;A little help with bathing/dressing/bathroom;Assistance with cooking/housework;Assist for transportation   Equipment Recommendations  None recommended by OT    Recommendations for Other Services      Precautions / Restrictions Precautions Precautions: Knee;Fall;Other (comment) Precaution Booklet Issued: No Precaution Comments: LVAD, watch HR and BP (hx of orthostatic hypotension), R TKA 12/03/21 Restrictions Weight Bearing Restrictions: No RLE Weight Bearing: Weight bearing as tolerated       Mobility Bed Mobility Overal bed mobility: Modified Independent Bed Mobility: Supine to Sit     Supine to  sit: Modified independent (Device/Increase time), HOB elevated     General bed mobility comments: able to get to EOB without assistance    Transfers Overall transfer level: Needs assistance Equipment used: Quad cane Transfers: Sit to/from Stand Sit to Stand: Supervision           General transfer comment: Supervision for safety     Balance Overall balance assessment: Needs assistance Sitting-balance support: No upper extremity supported, Feet supported Sitting balance-Leahy Scale: Good     Standing balance support: Single extremity supported, No upper extremity supported, During functional activity Standing balance-Leahy Scale: Fair Standing balance comment: used quad cane for support                           ADL either performed or assessed with clinical judgement   ADL                                              Extremity/Trunk Assessment              Vision       Perception     Praxis      Cognition Arousal/Alertness: Awake/alert Behavior During Therapy: WFL for tasks assessed/performed Overall Cognitive Status: Within Functional Limits for tasks assessed                                 General Comments: able to recall energy conservation strategies after review        Exercises  Shoulder Instructions       General Comments provided handout for energy conservation strategies and discussed. Also discussed fall prevention    Pertinent Vitals/ Pain       Pain Assessment Pain Assessment: Faces Faces Pain Scale: Hurts little more Pain Location: R knee Pain Descriptors / Indicators: Discomfort, Guarding, Grimacing Pain Intervention(s): Limited activity within patient's tolerance, Monitored during session, Repositioned  Home Living                                          Prior Functioning/Environment              Frequency  Min 2X/week        Progress Toward  Goals  OT Goals(current goals can now be found in the care plan section)  Progress towards OT goals: Progressing toward goals  Acute Rehab OT Goals Patient Stated Goal: go home OT Goal Formulation: With patient Time For Goal Achievement: 02/24/22 Potential to Achieve Goals: Good ADL Goals Additional ADL Goal #1: Pt will independently verbalize 3 energy conservation strategies Additional ADL Goal #2: Pt will independently verbalize 3 strategies to reduce risk of falls  Plan Discharge plan remains appropriate    Co-evaluation                 AM-PAC OT "6 Clicks" Daily Activity     Outcome Measure   Help from another person eating meals?: None Help from another person taking care of personal grooming?: None Help from another person toileting, which includes using toliet, bedpan, or urinal?: A Little Help from another person bathing (including washing, rinsing, drying)?: A Little Help from another person to put on and taking off regular upper body clothing?: None Help from another person to put on and taking off regular lower body clothing?: A Little 6 Click Score: 21    End of Session Equipment Utilized During Treatment: Other (comment) (quadcane)  OT Visit Diagnosis: Unsteadiness on feet (R26.81);Muscle weakness (generalized) (M62.81) Pain - Right/Left: Right Pain - part of body: Knee   Activity Tolerance Patient tolerated treatment well   Patient Left in chair;with call bell/phone within reach   Nurse Communication Mobility status        Time: 0752-0808 OT Time Calculation (min): 16 min  Charges: OT General Charges $OT Visit: 1 Visit OT Treatments $Therapeutic Activity: 8-22 mins  Lodema Hong, [place]  Pager [phone] Office [place] 02/13/2022, 10:17 AM

## 2022-02-13 NOTE — Discharge Summary (Incomplete)
Advanced Heart Failure Team  Discharge Summary   Patient ID: Johnny Oneill MRN: [id number], DOB/AGE: Nov 01, 1940 81 y.o. Admit date: 02/09/2022 D/C date:     02/16/2022   Primary Discharge Diagnoses:  1) Acute on Chronic Systolic HF---> cardiogenic shock  2) Weakness/anorexia 3) LVAD: HMII 08/2013 for DT. 4) Orthostatic hypotension, post-op 5) VT/Frequent PVCs 6) Atrial flutter, recurrent 7) Chronic anticoagulation: INR goal 2.0-3.0.    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    Admitted 12/01/21 for R TKA. Did well with surgery but complicated by severe orthostatic hypotension. Midodrine added     Admitted 12/18/21 with syncope. No events on device. Hydrated. Midodrine increased to 15 tid. Sotalol held   Admitted 12/28/21 with recurrent VT. Had DC-CV in ER. Sotalol restarted. Continued to have elevated MAPs as well as orthostasis. Midodrine decreased and florinef added.    Admitted for a/c RV failure w/ cardiogenic shock. LFTs, INR, creatinine elevated on admit. Started on empiric milrinone with gradual improvement. LFTs/INR/creatinine improved. Had [place] primary left sided failure. LVAD speed optimized with speed increased to 9800.  Milrinone stopped without difficulty. INR followed closely by pharmacy with adjustments to coumadin.   Orthostatic hypotension resolved so florinef was stopped. Maps running high and was started on cardura + hydralazine. Hgb dropped 7.9 without obvious source. Given unit of blood with appropriate rise. He remained on aspirin.   From HF perspective he remained stable. He does not require daily diuretics. GDMT limited by RV failure and CKD Stage IV.   He will continue to be  followed closely in the VAD clinic. Check INR and Maps on Thursday at 10:30. Full clinic visit next week.    1) Acute on Chronic Systolic HF--> Cardiogenic Shock - ICM, EF 20-25% s/p LVAD HM II implant 08/2013 for DT.   - Echo 3/18 EF 10-15% mild AI. Mod RV dysfunction. LVAD cannula OK - Echo 4/23 EF 20-25%, RV mod reduced  - has struggled with RV failure however RHC 11/22 looked good (despite evidence of RVF on LE dopplers showing reflux - Now NYHA IV. Has not bounced back after his knee surgery. + weakness & anorexia. Concern for RV failure. Labs c/w shock . Placed empiric milrinone with shock.   - RHC with primary left sided failure. VAD speed increasd to 9800. VAD Interrogation remained stable. Milrinone stopped as he improved.  - Maps running high. Started on cardura and hydralazine.  - Orthostatic hypotension resolved.  - Failed Jardiance due to volume depletion - He has not tolerated ACE-I or amlodipine or b-blocker in the past.   2) Weakness/anorexia - Suspect due to RV failure/ shock.  - Improved as shock resolved.    3) LVAD: HMII 08/2013 for DT. - VAD interrogated personally. Parameters stable. - Admitted in May 2018 with elevated LDH with bival.  - 6/15 Speed increased to 9800  - LDH stable. INR high on admit. Suspect RV failure.  - INR followed closely and coumadin adjusted by Pharmacy team.  - Continue ASA  - DL site ok   4) Orthostatic hypotension, post-op - Resolved. Florinef stopped. No further difficulty.    5) VT/Frequent PVCs - now s/p ICD gen  change.  - Off amio due to cerebellar side effects.  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 - VT  06/16/21 with  DC-CV x 1 - Recurrent VT 12/28/21 after stopping sotalol. Sotalol restarted. QT ok.  - PVCs improved on mexilitene. Continue mexilitene 150 bid - Keep K > 4.0 Mg > 2.0. K and Mag adjusted.  - Continue to f/u with EP. Options limited   6) Atrial flutter, recurrent - patient paced out of AFL in 6/22. Atrial therapies enabled   - continue sotalol (on for VT). QTs slightly prolonged but stable - SR today.    7) Chronic anticoagulation: INR goal 2.0-3.0.    - INR 5.5 on admit.  Suspect RV failure  - INR down w/ Vit K,  - INR adjusted as needed. INR 1.8 on the day discharge. He has been taking Ensure will need to watch INR. Appetite improved. Check INR on Thursday.   - Coumadin adjusted with pharmacy help.  - Hold coumadin.  - Continue ASA 81 mg for LVAD    8) HTN:  - Florinef discontinued   - 6/15 started on cardura. Maps 110s. Continue cardura 4 mg twice a day and add hydralazine 25 mg twice a day. Check Maps at clinic f/u.   - h/o orthostatic hypotension but resolved.    9) Hypothyroidism:  - on levothyroxine. TFTs ok  - Followed by Dr Dwyane Dee.    10) Hyperlipidemia:  - Continue Crestor. Zetia stopped due to cost.  - No change.    11) CAD - no s/s ischemia - continue Crestor   12) AKI on CKD IV - Creatinine baseline 1.7-2.0 - SCr peaked at 2.4  in setting of low output/cardiorenal  -On the day discharge SCr 2.07.  - failed Jardiance - Has Nephrology referral.    13) Chronic Anemia  - Hgb dropped to 7.9 with no obvious source. Given unit of blood.  -Hgb 10.8 today.   -No obvious source.  - For now continue aspirin.    14) Shock Liver - Suspect 2/2 RV failure  - Improving w/ milrinone  - AST 537 peak >>35 today  - ALT 1003 peak >>246 today      Discharge Weight: 152 pounds.  Discharge Vitals: Blood pressure (!) 128/106, pulse 84, temperature 97.6 F (36.4 C), temperature source Oral, resp. rate (!) 23, height 5\' 7"  (1.702 m), weight 69.1 kg, SpO2 94 %.  Labs: Lab Results  Component Value Date   WBC 5.1 02/16/2022   HGB 10.8 (L) 02/16/2022   HCT 33.9 (L) 02/16/2022   MCV 96.0 02/16/2022   PLT 171 02/16/2022    Recent Labs  Lab 02/16/22 0122  NA 138  K 4.1  CL 101  CO2 26  BUN 34*  CREATININE 2.07*  CALCIUM 9.2  PROT 6.5  BILITOT 0.8  ALKPHOS 114  ALT 140*  AST 28   GLUCOSE 110*   Lab Results  Component Value Date   CHOL 93 05/28/2015   HDL 41.50 05/28/2015   LDLCALC 29 05/28/2015   TRIG 112.0 05/28/2015   BNP (last 3 results) No results for input(s): "BNP" in the last 8760 hours.  ProBNP (last 3 results) No results for input(s): "PROBNP" in the last 8760 hours.   Diagnostic Studies/Procedures   No results found.  Discharge Medications   Allergies as of 02/16/2022       Reactions   Ace Inhibitors Other (See Comments)   Hypotension and elevated creatinine   Amiodarone Other (See Comments)  Cerebellar side effects   Diovan [valsartan] Other (See Comments)   Hypotension at low dose, hyperkalemia   Lipitor [atorvastatin Calcium] Other (See Comments)   Muscle pain   Jardiance [empagliflozin] Other (See Comments)   Unsure of exact side effect   Norvasc [amlodipine] Other (See Comments)   Put patient to sleep        Medication List     STOP taking these medications    fludrocortisone 0.1 MG tablet Commonly known as: FLORINEF       TAKE these medications    acetaminophen 500 MG tablet Commonly known as: TYLENOL Take 1,000 mg by mouth every 6 (six) hours as needed (for pain or headaches).   aspirin EC 81 MG tablet Take 81 mg by mouth in the morning.   citalopram 10 MG tablet Commonly known as: CELEXA Take 1 tablet by mouth once daily What changed: when to take this   CoQ10 200 MG Caps Take 200 mg by mouth in the morning.   doxazosin 4 MG tablet Commonly known as: CARDURA Take 1 tablet (4 mg total) by mouth every 12 (twelve) hours.   dronabinol 2.5 MG capsule Commonly known as: Marinol Take 1 capsule (2.5 mg total) by mouth daily before lunch.   furosemide 20 MG tablet Commonly known as: LASIX Take 1 tablet (20 mg total) by mouth daily as needed (Take 20 mg as daily as needed for wt > 153 lbs). Reported on 01/14/2016 What changed:  reasons to take this additional instructions   hydrALAZINE 25 MG  tablet Commonly known as: APRESOLINE Take 1 tablet (25 mg total) by mouth 2 (two) times daily.   melatonin 5 MG Tabs Take 5 mg by mouth at bedtime as needed (for sleep).   mexiletine 150 MG capsule Commonly known as: MEXITIL Take 1 capsule by mouth twice daily   oxyCODONE 5 MG immediate release tablet Commonly known as: Oxy IR/ROXICODONE Take 1 tablet (5 mg total) by mouth every 12 (twelve) hours as needed for breakthrough pain.   pantoprazole 40 MG tablet Commonly known as: PROTONIX Take 1 tablet by mouth once daily What changed: when to take this   PEPCID PO Take 1 tablet by mouth every morning.   PROBIOTIC PO Take 1 capsule by mouth in the morning.   rosuvastatin 20 MG tablet Commonly known as: CRESTOR Take 1 tablet (20 mg total) by mouth daily. What changed: when to take this   sotalol 80 MG tablet Commonly known as: BETAPACE Take 1 tablet (80 mg total) by mouth daily.   Synthroid 175 MCG tablet Generic drug: levothyroxine 1 tablet daily every morning except half tablet on Sundays What changed:  how much to take how to take this when to take this additional instructions   traMADol 50 MG tablet Commonly known as: ULTRAM Take 1-2 tablets (50-100 mg total) by mouth every 6 (six) hours as needed for moderate pain.   traZODone 50 MG tablet Commonly known as: DESYREL TAKE 1 TABLET BY MOUTH AT BEDTIME   Vitamin D3 125 MCG (5000 UT) Caps Take 5,000 Units by mouth daily with breakfast.   warfarin 5 MG tablet Commonly known as: COUMADIN Take as directed. If you are unsure how to take this medication, talk to your nurse or doctor. Original instructions: Take 5 mg (1 tablet) every day as directed by HF Clinic What changed: additional instructions         Disposition   The patient will be discharged in stable condition to home.  Discharge Instructions     (HEART FAILURE PATIENTS) Call MD:  Anytime you have any of the following symptoms: 1) 3 pound weight  gain in 24 hours or 5 pounds in 1 week 2) shortness of breath, with or without a dry hacking cough 3) swelling in the hands, feet or stomach 4) if you have to sleep on extra pillows at night in order to breathe.   Complete by: As directed    Diet - low sodium heart healthy   Complete by: As directed    Heart Failure patients record your daily weight using the same scale at the same time of day   Complete by: As directed    INR  Goal: 2 - 2.5   Complete by: As directed    Goal: 2 - 2.5   Increase activity slowly   Complete by: As directed    [facility] VAD Coordinator at [phone]  Notify for: any VAD alarms, sustained elevations of power >10 watts, sustained drop in Pulse Index <3   Complete by: As directed    Notify for:  any VAD alarms sustained elevations of power >10 watts sustained drop in Pulse Index <3     Speed Settings:   Complete by: As directed    Fixed 9800 RPM Low 9200 RPM          Duration of Discharge Encounter: Greater than 35 minutes   Signed, Finnis Colee NP-C  02/16/2022, 10:04 AM  Agree with above. [place] for d/c. See my rounding note for further details.   Glori Bickers, MD  6:10 PM

## 2022-02-13 NOTE — Consult Note (Signed)
   [facility] Inpatient Consult   02/13/2022  Johnny Oneill 25-Mar-1941 [id number]  [place] [facility] [ACO] Patient: Medicare ACO REACH  Primary Care Provider:  Jolaine Artist, MD, [facility] [specialist]  Patient screened for 4 hospitalization in the past 6 months for high risk score for unplanned readmission risk.   Brief review of patient's medical record MD progress notes reveals patient is followed by LVAD team.  For questions contact:   Natividad Brood, RN BSN [facility] Liaison  [phone] business mobile phone Toll free office [phone]  Fax number: [phone] [email or web address]

## 2022-02-13 NOTE — Progress Notes (Signed)
[place] for warfarin Indication:  LVAD  Allergies  Allergen Reactions   Ace Inhibitors Other (See Comments)    Hypotension and elevated creatinine   Amiodarone Other (See Comments)    Cerebellar side effects   Diovan [Valsartan] Other (See Comments)    Hypotension at low dose, hyperkalemia   Lipitor [Atorvastatin Calcium] Other (See Comments)    Muscle pain   Jardiance [Empagliflozin] Other (See Comments)    Unsure of exact side effect   Norvasc [Amlodipine] Other (See Comments)    Put patient to sleep    Patient Measurements: Height: 5\' 7"  (170.2 cm) Weight: 68.3 kg (150 lb 9.2 oz) IBW/kg (Calculated) : 66.1  Vital Signs: Temp: 97.8 F (36.6 C) (06/16 1410) Temp Source: Oral (06/16 1410) BP: 90/78 (06/16 1410) Pulse Rate: 80 (06/16 1410)  Labs: Recent Labs    02/12/22 0103 02/12/22 0843 02/12/22 0905 02/12/22 1042 02/13/22 0142  HGB 8.3*   < > 9.2* 9.1* 7.9*  HCT 26.5*   < > 27.0* 30.4* 26.3*  PLT 197  --   --  224 184  LABPROT 24.1*  --   --  22.7* 23.1*  INR 2.2*  --   --  2.0* 2.1*  CREATININE 2.23*  --   --  1.99* 1.91*   < > = values in this interval not displayed.     Estimated Creatinine Clearance: 29.8 mL/min (A) (by C-G formula based on SCr of 1.91 mg/dL (H)).   Medical History: Past Medical History:  Diagnosis Date   Anxiety    Automatic implantable cardioverter-defibrillator in situ    CAD (coronary artery disease)    S/P stenting of LCX in 2004;  s/p Lat MI 2009 with occlusion of the LCX - treated with Promus stenting. Has total occlusion of the RCA.    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 10/09 Echo: Sev LV Dysfxn, inf/lat AK, Mod MR.   Liver disease    LVAD (left ventricular assist device)  present ([place])    Osteoarthritis    a. s/p R TKR   Pacemaker    PVC (premature ventricular contraction)    RBBB    Shortness of breath     Assessment: 81 yo male on chronic Coumadin for LVAD admitted with supratherapeutic INR. INR has been trending down after small dose of vitamin K on 6/12. Low dose warfarin restarted yesterday 6/14.  .    Of note, his INR goal is usually higher (2.5-3) due to elevations in LDH in the past. LDH currently 210.  Today's INR 2.1.  Goal of Therapy:  INR goal 2.5-3 Monitor platelets by anticoagulation protocol: Yes   Plan:  Warfarin 4 mg po x 1 again tonight Daily INR.  [place] Crane, Roylene Reason, BCCP Clinical Pharmacist  02/13/2022 2:30 PM   [facility] pharmacy phone numbers are listed on [email or web address]

## 2022-02-13 NOTE — Care Management Important Message (Signed)
Important Message  Patient Details  Name: Johnny Oneill MRN: [id number] Date of Birth: [birth year]   Medicare Important Message Given:  Yes     [name] 02/13/2022, 1:49 PM

## 2022-02-13 NOTE — Progress Notes (Addendum)
Advanced Heart Failure VAD Team Note  PCP-Cardiologist: Dr. Haroldine Laws   Subjective:     6/12: Direct admit for inotropic support given concern for RV failure and shock liver. Started on milrinone. Given PO Vit K (INR 5.5)  6/12: 18 beat run of NSVT (K 2.7, Mg 1.9). Several low flow alarms on VAD  6/13 Florinef stopped.  6/15 RHC on milrinone with speed increased to 9800. Started on cardura and  RA = 13 RV = 51/17 PA = 47/23 (33) PCW = 25 (v = 40) Fick cardiac output/index = 4.3/2.4 PVR = 2.8 WU FA sat = 96% PA sat = 47%, 47% After hydralazine 20 IV @ 9800 Flow 4.4 L/min on VAD MAP 110  PCW = 18 (v = 25) Fick = 6.2/3.5 PA sat = 62%   AST and ALT continue to trend down.   INR 5.5>>3.6>>2.5 >>2.2>2.1 Hgb 9.1>7.9 No gross bleeding  LDH 440 on admit ---> today 210  SCr 2.41 peaked >>1.9 today.    Feels ok.      LVAD INTERROGATION:  HeartMate II LVAD:   Flow 3.9  liters/min, speed 9800, power 6, PI 5.2  9 PI events since MN.  Objective:    Vital Signs:   Temp:  [97.6 F (36.4 C)-97.9 F (36.6 C)] 97.8 F (36.6 C) (06/16 0801) Pulse Rate:  [0-88] 76 (06/16 0429) Resp:  [12-27] 16 (06/16 0801) BP: (89-142)/(76-121) 108/93 (06/16 0429) SpO2:  [93 %-100 %] 100 % (06/16 0429) Weight:  [68.3 kg] 68.3 kg (06/16 0625) Last BM Date : 02/10/22 Mean arterial Pressure 100s  Intake/Output:   Intake/Output Summary (Last 24 hours) at 02/13/2022 0817 Last data filed at 02/13/2022 0430 Gross per 24 hour  Intake 517.82 ml  Output 550 ml  Net -32.18 ml     Physical Exam   Physical Exam: GENERAL: No acute distress.Sitting in the chair.  HEENT: normal  NECK: Supple, JVP  .  2+ bilaterally, no bruits.  No lymphadenopathy or thyromegaly appreciated.   CARDIAC:  Mechanical heart sounds with LVAD hum present.  LUNGS:  Clear to auscultation bilaterally.  ABDOMEN:  Soft, round, nontender, positive bowel sounds x4.     LVAD exit site: well-healed and incorporated.  Dressing  dry and intact.  No erythema or drainage.  Stabilization device present and accurately applied.  Driveline dressing is being changed daily per sterile technique. EXTREMITIES:  Warm and dry, no cyanosis, clubbing, rash or edema  NEUROLOGIC:  Alert and oriented x 3.    No aphasia.  No dysarthria.  Affect pleasant.         Telemetry  A-V paced 80s  EKG    No new EKG to review   Labs   Basic Metabolic Panel: Recent Labs  Lab 02/09/22 0929 02/10/22 0258 02/10/22 0612 02/11/22 0252 02/11/22 1936 02/12/22 0103 02/12/22 2355 02/12/22 0849 02/12/22 0905 02/12/22 1042 02/13/22 0142  NA 139  --    < > 140 140 141 141 143  142 144 140 139  K 2.7* 4.0   < > 3.1* 3.5 4.8 3.9 3.6  3.7 3.5 3.4* 4.1  CL 105  --    < > 101 104 104  --   --   --  105 107  CO2 25  --    < > 29 28 29   --   --   --  26 24  GLUCOSE 121*  --    < > 104* 124* 114*  --   --   --  139* 122*  BUN 29*  --    < > 29* 31* 32*  --   --   --  28* 30*  CREATININE 2.41*  --    < > 2.30* 2.20* 2.23*  --   --   --  1.99* 1.91*  CALCIUM 8.7*  --    < > 8.9 8.9 9.2  --   --   --  9.2 8.9  MG 1.9 1.9  --   --   --   --   --   --   --   --  1.8   < > = values in this interval not displayed.    Liver Function Tests: Recent Labs  Lab 02/10/22 0612 02/11/22 0252 02/12/22 0103 02/12/22 1042 02/13/22 0142  AST 227* 108* 54* 48* 35  ALT 760* 530* 362* 341* 246*  ALKPHOS 124 117 121 105 111  BILITOT 1.4* 1.4* 1.3* 1.2 0.9  PROT 6.3* 6.1* 6.0* 6.3* 5.4*  ALBUMIN 3.2* 3.2* 3.0* 3.2* 2.7*   No results for input(s): "LIPASE", "AMYLASE" in the last 168 hours. No results for input(s): "AMMONIA" in the last 168 hours.  CBC: Recent Labs  Lab 02/11/22 0252 02/11/22 1936 02/12/22 0103 02/12/22 0843 02/12/22 0849 02/12/22 0905 02/12/22 1042 02/13/22 0142  WBC 6.3 6.0 6.8  --   --   --  8.1 5.1  HGB 8.6* 8.5* 8.3* 9.9* 9.5*  9.9* 9.2* 9.1* 7.9*  HCT 27.4* 27.4* 26.5* 29.0* 28.0*  29.0* 27.0* 30.4* 26.3*  MCV 94.8  96.5 96.7  --   --   --  95.9 96.7  PLT 205 189 197  --   --   --  224 184    INR: Recent Labs  Lab 02/11/22 0252 02/11/22 1936 02/12/22 0103 02/12/22 1042 02/13/22 0142  INR 2.5* 2.1* 2.2* 2.0* 2.1*    Other results: EKG:    Imaging   CARDIAC CATHETERIZATION  Result Date: 02/12/2022 Findings: On milrinone 0.125 @ 9600 Flow 2.8 L/m on VAD MAP 120s HepW = 11 RA = 13 RV = 51/17 PA = 47/23 (33) PCW = 25 (v = 40) Fick cardiac output/index = 4.3/2.4 PVR = 2.8 WU FA sat = 96% PA sat = 47%, 47% On milrinone 0.125 After hydralazine 20 IV @ 9800 Flow 4.4 L/min on VAD MAP 110 PCW = 18 (v = 25) Fick = 6.2/3.5 PA sat = 62% Assessment: 1. Primarily left-sided issue in setting of elevated MAPs Plan/Discussion: Flows increased with better BP control and increasing LVAD speed to 9800. Continue to optimize MAPs and LVAD speed. Hopefully can wean milrinone. Glori Bickers, MD 9:23 AM    Medications:     Scheduled Medications:  aspirin EC  81 mg Oral Daily   citalopram  10 mg Oral q morning   doxazosin  1 mg Oral Q12H   feeding supplement  237 mL Oral BID BM   Gerhardt's butt cream   Topical Daily   levothyroxine  175 mcg Oral Once per day on Mon Tue Wed Thu Fri Sat   [START ON 02/15/2022] levothyroxine  87.5 mcg Oral Once per day on Sun   mexiletine  150 mg Oral BID   multivitamin with minerals  1 tablet Oral Daily   pantoprazole  40 mg Oral Daily   sodium chloride flush  3 mL Intravenous Q12H   sodium chloride flush  3 mL Intravenous Q12H   sotalol  80 mg Oral Daily   Warfarin - Pharmacist  Dosing Inpatient   Does not apply q1600    Infusions:  sodium chloride Stopped (02/10/22 0820)   sodium chloride     milrinone 0.125 mcg/kg/min (02/12/22 0758)    PRN Medications: sodium chloride, sodium chloride, acetaminophen, melatonin, ondansetron (ZOFRAN) IV, sodium chloride flush   Patient Profile   81 y/o male w/ CAD, 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   - Echo 3/18 EF 10-15% mild AI. Mod RV dysfunction. LVAD cannula OK - Echo 4/23 EF 20-25%, RV mod reduced  - has struggled with RV failure however RHC 11/22 looked good (despite evidence of RVF on LE dopplers showing reflux - Now NYHA IV. Has not bounced back after his knee surgery. + weakness & anorexia. Concern for RV failure. Labs c/w shock  - PICC team unable to place PICC.  - RHC with primary left sided failure. VAD speed increasd to 9800.  -On empiric milrinone 0.125. Continue for now.  - Maps running high. Started on cardura. Maps 100 today. Increase cardura 2 mg twice a day.  - GDMT limited by orthostatic hypotension.  - Failed Jardiance due to volume depletion - He has not tolerated ACE-I or amlodipine or b-blocker in the past.   2) Weakness/anorexia - Suspect due to RV failure/ shock.  - Plan per above .  - Will plan RHC tomorrow.    3) LVAD: HMII 08/2013 for DT. - VAD interrogated personally. Parameters stable. - Admitted in May 2018 with elevated LDH with bival.  - 6/15 Speed increased to 9800  - LDH stable   -  INR high on admit. Suspect RV failure.  - INR 2.1 today.  Continue coumadin. . Continue ASA  - DL site ok - MAPs 100s. See above.    4) Orthostatic hypotension, post-op - Resolved. 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  - Off amio due to cerebellar side effects.  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 - VT  06/16/21 with  DC-CV x 1 - Recurrent VT 12/28/21 after stopping sotalol. Sotalol restarted. QT ok.  - PVCs improved on mexilitene. Continue mexilitene 150 bid - Keep K > 4.0 Mg > 2.0.  - Give 2 grams mag.  - Continue to f/u with EP. Options limited   6) Atrial flutter, recurrent - patient paced out of AFL in 6/22.  Atrial therapies enabled  - continue sotalol (on for VT). QTs slightly prolonged but stable - SR today.    7) Chronic anticoagulation: INR goal 2.0-3.0.    - INR 5.5 on admit.  Suspect RV failure  - INR down w/ Vit K, 2.2 today  - Hold coumadin. Give Ensure - Continue ASA 81 mg for LVAD   8) HTN:  - Florinef discontinued. Long half-life.  - Milrinone per above  - Flornief has long half life.  - 6/15 started on cardura. Maps 100s. Increase cardura 2 mg twice a day.  - h/o orthostatic hypotension. No further   9) Hypothyroidism:  - on levothyroxine. TFTs ok  - Followed by Dr Dwyane Dee.    10) Hyperlipidemia:  - Continue Crestor. Zetia stopped due to cost.  - No change.    11) CAD - no s/s  ischemia - continue Crestor   12) AKI on CKD IV - Creatinine baseline 1.7-2.0 - SCr 2.3 in setting of low output/cardiorenal  - Creatinine stable 1.9  - Milrinone support per above   - failed Jardiance - Has Nephrology referral   13) Chronic Anemia  - Hgb 9.1>7.9 . No obvious source.  - May need to stop aspirin.  - Give1 UPRBCs.  - Follow daily CBC.    14) Shock Liver - Suspect 2/2 RV failure  - Improving w/ milrinone  - AST 537 peak >>35 today  - ALT 1003 peak >>246 today    Given 1uPRBCs.    I reviewed the LVAD parameters from today, and compared the results to the patient's prior recorded data.  No programming changes were made.  The LVAD is functioning within specified parameters.  The patient performs LVAD self-test daily.  LVAD interrogation was negative for any significant power changes, alarms or PI events/speed drops.  LVAD equipment check completed and is in good working order.  Back-up equipment present.   LVAD education done on emergency procedures and precautions and reviewed exit site care.  Length of Stay: 4  Amy Clegg, NP 02/13/2022, 8:17 AM  VAD Team --- VAD ISSUES ONLY--- Pager [phone] (7am - 7am)  Advanced Heart Failure Team  Pager [phone] (M-F; 7a - 5p)   Please contact [facility] for night-coverage after hours (5p -7a ) and weekends on [email or web address]  Patient seen and examined with the above-signed Advanced Practice Provider and/or Housestaff. I personally reviewed laboratory data, imaging studies and relevant notes. I independently examined the patient and formulated the important aspects of the plan. I have edited the note to reflect any of my changes or salient points. I have personally discussed the plan with the patient and/or family.  Remains on milrinone. RHC yesterday with low output in setting of high afterload. Speed increased. Anti-HTN meds started. MAPs 70-80s today. Flows up.   Hgb low. No evidence GIB. Having runs NSVT.   General:  NAD.  HEENT: normal  Neck: supple. JVP not elevated.  Carotids 2+ bilat; no bruits. No lymphadenopathy or thryomegaly appreciated. Cor: LVAD hum.  Lungs: Clear. Abdomen: soft, nontender, non-distended. No hepatosplenomegaly. No bruits or masses. Good bowel sounds. Driveline site clean. Anchor in place.  Extremities: no cyanosis, clubbing, rash. Warm no edema  Neuro: alert & oriented x 3. No focal deficits. Moves all 4 without problem   Low output improved with increasing VAD speed and decreasing afterload. Can likely stop milrinone tomorrow. Give 1u RBCs. Watch ectopy. Keep K> 4.0 Mg > 2.0. Continue sotalol. VAD interrogated personally. Parameters stable.  Glori Bickers, MD  6:56 PM

## 2022-02-13 NOTE — Plan of Care (Signed)
?  Problem: Education: ?Goal: Knowledge of General Education information will improve ?Description: Including pain rating scale, medication(s)/side effects and non-pharmacologic comfort measures ?Outcome: Progressing ?  ?Problem: Health Behavior/Discharge Planning: ?Goal: Ability to manage health-related needs will improve ?Outcome: Progressing ?  ?Problem: Clinical Measurements: ?Goal: Ability to maintain clinical measurements within normal limits will improve ?Outcome: Progressing ?Goal: Will remain free from infection ?Outcome: Progressing ?Goal: Diagnostic test results will improve ?Outcome: Progressing ?Goal: Respiratory complications will improve ?Outcome: Progressing ?Goal: Cardiovascular complication will be avoided ?Outcome: Progressing ?  ?Problem: Activity: ?Goal: Risk for activity intolerance will decrease ?Outcome: Progressing ?  ?Problem: Nutrition: ?Goal: Adequate nutrition will be maintained ?Outcome: Progressing ?  ?Problem: Coping: ?Goal: Level of anxiety will decrease ?Outcome: Progressing ?  ?Problem: Elimination: ?Goal: Will not experience complications related to bowel motility ?Outcome: Progressing ?Goal: Will not experience complications related to urinary retention ?Outcome: Progressing ?  ?Problem: Pain Managment: ?Goal: General experience of comfort will improve ?Outcome: Progressing ?  ?Problem: Safety: ?Goal: Ability to remain free from injury will improve ?Outcome: Progressing ?  ?Problem: Skin Integrity: ?Goal: Risk for impaired skin integrity will decrease ?Outcome: Progressing ?  ?Problem: Education: ?Goal: Patient will understand all VAD equipment and how it functions ?Outcome: Progressing ?Goal: Patient will be able to verbalize current INR target range and antiplatelet therapy for discharge home ?Outcome: Progressing ?  ?Problem: Cardiac: ?Goal: LVAD will function as expected and patient will experience no clinical alarms ?Outcome: Progressing ?  ?

## 2022-02-13 NOTE — Progress Notes (Signed)
LVAD Coordinator Rounding Note:  Direct admit 02/09/22 from home following clinic visit.  Pt admitted with RV failure and shock liver.  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 Darcey [name] under bridge to transplant criteria. Patient was removed from transplant list 05/2016 per his choice. He is currently Destination Therapy LVAD.   Pt had drop in hgb today 7.9, will receive 1 u/PRBC today. Pt feels ok, denies any dizziness/lightheadness when up walking. Seems to be tolerating new speed.  Milrinone 0.125 mcg/kg/min via PIV. Unable to place PICC. INR 2.1 - holding coumadin.    Vital signs       Temp: 97.8 HR: 83  Doppler Pressure: 100     Automatic BP: 108/93 (100)   O2 Sat: 100% on RA Wt: 158>149.4>150.7>150.5 lbs  LVAD interrogation reveals:  Speed: 9800    Flow: 4.0    Power: 5.6 w   PI: 6.6     Alarms: none  Events: 10 today; 25 yesterday  Fixed speed:  9800 Low speed limit: 9200  Drive Line:  Drive line is being maintained weekly by [name], his wife. Existing VAD dressing clean, dry, and intact. Drive line anchor correctly applied. Weekly dressing changes per [facility] nurse or caregiver. Next dressing change due 02/17/22.   Labs:  LDH trend: 450>287>243>196>210  INR trend: 5.5>3.6>2.5>2>2.1  AST/ALT: 537/1003>227/760>105/530>48/341>35/246  Hgb: 9.1>7.9  Anticoagulation Plan: -INR Goal:  2.5 - 3.0 - Currently holding -ASA Dose: 81 mg daily   Device: [employer] dual - Therapies: on 231 bpm - Pacing:  AAI<=>DDD 80 bpm - Last check: in ED per [employer] rep on 12/18/21 - rate response turned on   Plan/Recommendations:  Call VAD Coordinator with any VAD equipment or drive line issues. Weekly dressing changes per [facility] nurse or caregiver.   Tanda Rockers RN [place] Coordinator  Office: [phone]  24/7 Pager: [phone]

## 2022-02-14 DIAGNOSIS — I50813 Acute on chronic right heart failure: Secondary | ICD-10-CM | POA: Diagnosis not present

## 2022-02-14 LAB — CBC
HCT: 28.1 % — ABNORMAL LOW (ref 39.0–52.0)
Hemoglobin: 9.1 g/dL — ABNORMAL LOW (ref 13.0–17.0)
MCH: 30.6 pg (ref 26.0–34.0)
MCHC: 32.4 g/dL (ref 30.0–36.0)
MCV: 94.6 fL (ref 80.0–100.0)
Platelets: 154 10*3/uL (ref 150–400)
RBC: 2.97 MIL/uL — ABNORMAL LOW (ref 4.22–5.81)
RDW: 19 % — ABNORMAL HIGH (ref 11.5–15.5)
WBC: 5.5 10*3/uL (ref 4.0–10.5)
nRBC: 0 % (ref 0.0–0.2)

## 2022-02-14 LAB — LIPOPROTEIN A (LPA): Lipoprotein (a): 175 nmol/L — ABNORMAL HIGH (ref <75.0)

## 2022-02-14 LAB — COMPREHENSIVE METABOLIC PANEL
ALT: 201 U/L — ABNORMAL HIGH (ref 0–44)
AST: 25 U/L (ref 15–41)
Albumin: 2.9 g/dL — ABNORMAL LOW (ref 3.5–5.0)
Alkaline Phosphatase: 98 U/L (ref 38–126)
Anion gap: 9 (ref 5–15)
BUN: 30 mg/dL — ABNORMAL HIGH (ref 8–23)
CO2: 25 mmol/L (ref 22–32)
Calcium: 8.8 mg/dL — ABNORMAL LOW (ref 8.9–10.3)
Chloride: 104 mmol/L (ref 98–111)
Creatinine, Ser: 2 mg/dL — ABNORMAL HIGH (ref 0.61–1.24)
GFR, Estimated: 34 mL/min — ABNORMAL LOW (ref >60)
Glucose, Bld: 99 mg/dL (ref 70–99)
Potassium: 4.2 mmol/L (ref 3.5–5.1)
Sodium: 138 mmol/L (ref 135–145)
Total Bilirubin: 1.4 mg/dL — ABNORMAL HIGH (ref 0.3–1.2)
Total Protein: 5.9 g/dL — ABNORMAL LOW (ref 6.5–8.1)

## 2022-02-14 LAB — PROTIME-INR
INR: 2 — ABNORMAL HIGH (ref 0.8–1.2)
Prothrombin Time: 22.6 seconds — ABNORMAL HIGH (ref 11.4–15.2)

## 2022-02-14 LAB — LACTATE DEHYDROGENASE: LDH: 170 U/L (ref 98–192)

## 2022-02-14 MED ORDER — WARFARIN SODIUM 5 MG PO TABS
5.0000 mg | ORAL_TABLET | Freq: Once | ORAL | Status: AC
  Administered 2022-02-14: 5 mg via ORAL
  Filled 2022-02-14: qty 1

## 2022-02-14 MED ORDER — DOXAZOSIN MESYLATE 2 MG PO TABS
3.0000 mg | ORAL_TABLET | Freq: Two times a day (BID) | ORAL | Status: DC
  Administered 2022-02-14 – 2022-02-15 (×2): 3 mg via ORAL
  Filled 2022-02-14 (×3): qty 1

## 2022-02-14 NOTE — Progress Notes (Signed)
Advanced Heart Failure VAD Team Note  PCP-Cardiologist: Dr. Haroldine Laws   Subjective:     6/12: Direct admit for inotropic support given concern for RV failure and shock liver. Started on milrinone. Given PO Vit K (INR 5.5)  6/12: 18 beat run of NSVT (K 2.7, Mg 1.9). Several low flow alarms on VAD  6/13 Florinef stopped.  6/15 RHC on milrinone with speed increased to 9800. Started on cardura and  RA = 13 RV = 51/17 PA = 47/23 (33) PCW = 25 (v = 40) Fick cardiac output/index = 4.3/2.4 PVR = 2.8 WU FA sat = 96% PA sat = 47%, 47% After hydralazine 20 IV @ 9800 Flow 4.4 L/min on VAD MAP 110  PCW = 18 (v = 25) Fick = 6.2/3.5 PA sat = 62%  Remains on milrinone. Feels good. Denies CP or SOB. MAPs back up ~110  Scr stable at 2.0. LFTs trending down.   LVAD INTERROGATION:  HeartMate II LVAD:   Flow 3.6  liters/min, speed 9800, power 5.0, PI 6.0  Objective:    Vital Signs:   Temp:  [97.3 F (36.3 C)-98.4 F (36.9 C)] 97.3 F (36.3 C) (06/17 0848) Pulse Rate:  [77-84] 83 (06/17 0848) Resp:  [12-20] 18 (06/17 0848) BP: (63-120)/(52-105) 120/105 (06/17 0900) SpO2:  [93 %-98 %] 98 % (06/17 0848) Weight:  [71.6 kg] 71.6 kg (06/17 0500) Last BM Date : 02/10/22 Mean arterial Pressure 100-110  Intake/Output:   Intake/Output Summary (Last 24 hours) at 02/14/2022 0935 Last data filed at 02/14/2022 0340 Gross per 24 hour  Intake 1273.04 ml  Output 450 ml  Net 823.04 ml      Physical Exam   General:  NAD.  HEENT: normal  Neck: supple. JVP not elevated.  Carotids 2+ bilat; no bruits. No lymphadenopathy or thryomegaly appreciated. Cor: LVAD hum.  Lungs: Clear. Abdomen: soft, nontender, non-distended. No hepatosplenomegaly. No bruits or masses. Good bowel sounds. Driveline site clean. Anchor in place.  Extremities: no cyanosis, clubbing, rash. Warm no edema  Neuro: alert & oriented x 3. No focal deficits. Moves all 4 without problem   Telemetry  A-V paced 80s   Labs    Basic Metabolic Panel: Recent Labs  Lab 02/09/22 0929 02/10/22 0258 02/10/22 0612 02/11/22 1936 02/12/22 0103 02/12/22 1610 02/12/22 0849 02/12/22 0905 02/12/22 1042 02/13/22 0142 02/14/22 0020  NA 139  --    < > 140 141   < > 143  142 144 140 139 138  K 2.7* 4.0   < > 3.5 4.8   < > 3.6  3.7 3.5 3.4* 4.1 4.2  CL 105  --    < > 104 104  --   --   --  105 107 104  CO2 25  --    < > 28 29  --   --   --  26 24 25   GLUCOSE 121*  --    < > 124* 114*  --   --   --  139* 122* 99  BUN 29*  --    < > 31* 32*  --   --   --  28* 30* 30*  CREATININE 2.41*  --    < > 2.20* 2.23*  --   --   --  1.99* 1.91* 2.00*  CALCIUM 8.7*  --    < > 8.9 9.2  --   --   --  9.2 8.9 8.8*  MG 1.9 1.9  --   --   --   --   --   --   --  1.8  --    < > = values in this interval not displayed.     Liver Function Tests: Recent Labs  Lab 02/11/22 0252 02/12/22 0103 02/12/22 1042 02/13/22 0142 02/14/22 0020  AST 108* 54* 48* 35 25  ALT 530* 362* 341* 246* 201*  ALKPHOS 117 121 105 111 98  BILITOT 1.4* 1.3* 1.2 0.9 1.4*  PROT 6.1* 6.0* 6.3* 5.4* 5.9*  ALBUMIN 3.2* 3.0* 3.2* 2.7* 2.9*    No results for input(s): "LIPASE", "AMYLASE" in the last 168 hours. No results for input(s): "AMMONIA" in the last 168 hours.  CBC: Recent Labs  Lab 02/11/22 1936 02/12/22 0103 02/12/22 0843 02/12/22 0905 02/12/22 1042 02/13/22 0142 02/13/22 1813 02/14/22 0020  WBC 6.0 6.8  --   --  8.1 5.1  --  5.5  HGB 8.5* 8.3*   < > 9.2* 9.1* 7.9* 9.1* 9.1*  HCT 27.4* 26.5*   < > 27.0* 30.4* 26.3* 29.8* 28.1*  MCV 96.5 96.7  --   --  95.9 96.7  --  94.6  PLT 189 197  --   --  224 184  --  154   < > = values in this interval not displayed.     INR: Recent Labs  Lab 02/11/22 1936 02/12/22 0103 02/12/22 1042 02/13/22 0142 02/14/22 0020  INR 2.1* 2.2* 2.0* 2.1* 2.0*     Other results:   Imaging   No results found.   Medications:     Scheduled Medications:  aspirin EC  81 mg Oral Daily    citalopram  10 mg Oral q morning   doxazosin  2 mg Oral Q12H   feeding supplement  237 mL Oral BID BM   Gerhardt's butt cream   Topical Daily   levothyroxine  175 mcg Oral Once per day on Mon Tue Wed Thu Fri Sat   [START ON 02/15/2022] levothyroxine  87.5 mcg Oral Once per day on Sun   mexiletine  150 mg Oral BID   multivitamin with minerals  1 tablet Oral Daily   pantoprazole  40 mg Oral Daily   sodium chloride flush  3 mL Intravenous Q12H   sodium chloride flush  3 mL Intravenous Q12H   sotalol  80 mg Oral Daily   Warfarin - Pharmacist Dosing Inpatient   Does not apply q1600    Infusions:  sodium chloride Stopped (02/10/22 0820)   sodium chloride     milrinone 0.125 mcg/kg/min (02/14/22 0340)    PRN Medications: sodium chloride, sodium chloride, acetaminophen, melatonin, ondansetron (ZOFRAN) IV, sodium chloride flush   Patient Profile   81 y/o male w/ CAD, 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   - Echo 3/18 EF 10-15% mild AI. Mod RV dysfunction. LVAD cannula OK - Echo 4/23 EF 20-25%, RV mod reduced  - has struggled with RV failure however RHC 11/22 looked good (despite evidence of RVF on LE dopplers showing reflux - Now NYHA IV. Has not bounced back after his knee surgery. + weakness & anorexia. Concern for RV failure. Labs c/w shock  - PICC team unable to place PICC.  - RHC with primary left sided failure in setting of very high afterload. VAD speed  increasd to 9800. Anti-HTN agents restarted - On empiric milrinone 0.125. Will stop today - Maps still  running high. Increase cardura 3 mg twice a day.  - Failed Jardiance due to volume depletion - He has not tolerated ACE-I or amlodipine or b-blocker in the past.   2)  Weakness/anorexia - Due to shock  - Plan per above .    3) LVAD: HMII 08/2013 for DT. - VAD interrogated personally. Parameters stable. - Admitted in May 2018 with elevated LDH with bival.  - 6/15 Speed increased to 9800  - LDH stable at 170   - INR 2.0 today.  Continue coumadin. . Continue ASA  Discussed dosing with PharmD personally. - DL site ok - MAPs up. Plan as above   4) Orthostatic hypotension, post-op - Resolved. 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  - Off amio due to cerebellar side effects.  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 - VT  06/16/21 with  DC-CV x 1 - Recurrent VT 12/28/21 after stopping sotalol. Sotalol restarted. QT ok.  - PVCs improved on mexilitene. Continue mexilitene 150 bid - Keep K > 4.0 Mg > 2.0.  - Occasional runs VT. Watch closely    6) Atrial flutter, recurrent - patient paced out of AFL in 6/22. Atrial therapies enabled  - continue sotalol (on for VT). QTs slightly prolonged but stable - Remains in NSR   7) Chronic anticoagulation: INR goal 2.0-3.0.    - INR 5.5 on admit.  Suspect RV failure  - INR down w/ Vit K, 2.0 today. Continue warfarin. Discussed dosing with PharmD personally. - Continue ASA 81 mg for LVAD   8) HTN:  - As above Increase cardura 3 mg twice a day.    9) Hypothyroidism:  - on levothyroxine. TFTs ok  - Followed by Dr Dwyane Dee.    10) Hyperlipidemia:  - Continue Crestor. Zetia stopped due to cost.  - No change.    11) CAD -no s/s ischemia  - continue Crestor   12) AKI on CKD IV - Creatinine baseline 1.7-2.0 - SCr 2.3 in setting of low output/cardiorenal  - Creatinine stable 2.0 - Milrinone support per above      13) Chronic Anemia  - Hgb 9.1>7.9 . No obvious source.  - May need to stop aspirin.  - Gave1 UPRBCs on 6/16 Hgb 9.1 today - Follow daily CBC.    14) Shock Liver - Suspect 2/2 RV failure  - Resolvedw/ milrinone  - AST 537 peak - ALT 1003 peak    I reviewed the LVAD  parameters from today, and compared the results to the patient's prior recorded data.  No programming changes were made.  The LVAD is functioning within specified parameters.  The patient performs LVAD self-test daily.  LVAD interrogation was negative for any significant power changes, alarms or PI events/speed drops.  LVAD equipment check completed and is in good working order.  Back-up equipment present.   LVAD education done on emergency procedures and precautions and reviewed exit site care.  Length of Stay: [place], MD 02/14/2022, 9:35 AM  VAD Team --- VAD ISSUES ONLY--- Pager [phone] (7am - 7am)  Advanced Heart Failure Team  Pager [phone] (M-F; 7a - 5p)  Please contact [facility] for night-coverage after hours (5p -7a ) and weekends on [email or web address]  Patient seen and examined with the above-signed Advanced Practice Provider and/or Housestaff. I personally reviewed laboratory data, imaging studies and relevant  notes. I independently examined the patient and formulated the important aspects of the plan. I have edited the note to reflect any of my changes or salient points. I have personally discussed the plan with the patient and/or family.  Remains on milrinone. RHC yesterday with low output in setting of high afterload. Speed increased. Anti-HTN meds started. MAPs 70-80s today. Flows up.   Hgb low. No evidence GIB. Having runs NSVT.   General:  NAD.  HEENT: normal  Neck: supple. JVP not elevated.  Carotids 2+ bilat; no bruits. No lymphadenopathy or thryomegaly appreciated. Cor: LVAD hum.  Lungs: Clear. Abdomen: soft, nontender, non-distended. No hepatosplenomegaly. No bruits or masses. Good bowel sounds. Driveline site clean. Anchor in place.  Extremities: no cyanosis, clubbing, rash. Warm no edema  Neuro: alert & oriented x 3. No focal deficits. Moves all 4 without problem   Low output improved with increasing VAD speed and decreasing afterload. Can likely stop  milrinone tomorrow. Give 1u RBCs. Watch ectopy. Keep K> 4.0 Mg > 2.0. Continue sotalol. VAD interrogated personally. Parameters stable.  Glori Bickers, MD  9:35 AM

## 2022-02-14 NOTE — Progress Notes (Signed)
Mobility Specialist: Progress Note   02/14/22 1527  Mobility  Activity Ambulated with assistance in hallway  Level of Assistance Standby assist, set-up cues, supervision of patient - no hands on  Assistive Device Cane  RLE Weight Bearing WBAT  Distance Ambulated (ft) 400 ft  Activity Response Tolerated well  $Mobility charge 1 Mobility   Received pt in bed having no complaints and agreeable to mobility. Pt was asymptomatic throughout ambulation and returned to room w/o fault. Left in bed w/ call bell in reach and all needs met.  [facility] Kindel Rochefort Mobility Specialist Mobility Specialist [address]: [phone]

## 2022-02-14 NOTE — Progress Notes (Signed)
[place] for warfarin Indication:  LVAD  Allergies  Allergen Reactions   Ace Inhibitors Other (See Comments)    Hypotension and elevated creatinine   Amiodarone Other (See Comments)    Cerebellar side effects   Diovan [Valsartan] Other (See Comments)    Hypotension at low dose, hyperkalemia   Lipitor [Atorvastatin Calcium] Other (See Comments)    Muscle pain   Jardiance [Empagliflozin] Other (See Comments)    Unsure of exact side effect   Norvasc [Amlodipine] Other (See Comments)    Put patient to sleep    Patient Measurements: Height: 5\' 7"  (170.2 cm) Weight: 71.6 kg (157 lb 13.6 oz) IBW/kg (Calculated) : 66.1  Vital Signs: Temp: 97.5 F (36.4 C) (06/17 1211) Temp Source: Oral (06/17 1211) BP: 98/69 (06/17 1414) Pulse Rate: 80 (06/17 1211)  Labs: Recent Labs    02/12/22 1042 02/13/22 0142 02/13/22 1813 02/14/22 0020  HGB 9.1* 7.9* 9.1* 9.1*  HCT 30.4* 26.3* 29.8* 28.1*  PLT 224 184  --  154  LABPROT 22.7* 23.1*  --  22.6*  INR 2.0* 2.1*  --  2.0*  CREATININE 1.99* 1.91*  --  2.00*     Estimated Creatinine Clearance: 28.5 mL/min (A) (by C-G formula based on SCr of 2 mg/dL (H)).   Medical History: Past Medical History:  Diagnosis Date   Anxiety    Automatic implantable cardioverter-defibrillator in situ    CAD (coronary artery disease)    S/P stenting of LCX in 2004;  s/p Lat MI 2009 with occlusion of the LCX - treated with Promus stenting. Has total occlusion of the RCA.    CHF (congestive heart failure) (HCC)    Hypercholesteremia    Hypertension    Hypothyroidism    ICD (implantable cardiac defibrillator) in place    PROPHYLACTIC      medtronic   Inferior MI ([place]) "? date"; 2009   Ischemic cardiomyopathy    a. 10/09 Echo: Sev LV Dysfxn, inf/lat AK, Mod MR.   Liver disease    LVAD (left ventricular assist device) present ([place])    Osteoarthritis    a. s/p R TKR   Pacemaker    PVC (premature ventricular  contraction)    RBBB    Shortness of breath     Assessment: 81 yo male on chronic Coumadin for LVAD admitted with supratherapeutic INR. INR has been trending down after small dose of vitamin K on 6/12. Low dose warfarin restarted 6/14.    Of note, his INR goal is usually higher (2.5-3) due to elevations in LDH in the past. LDH currently 170.  Today's INR 2.0.  Goal of Therapy:  INR goal 2.5-3 Monitor platelets by anticoagulation protocol: Yes   Plan:  Warfarin 5 mg po x 1 tonight. Daily INR.  [place] Crane, Roylene Reason, BCCP Clinical Pharmacist  02/14/2022 2:43 PM   [facility] pharmacy phone numbers are listed on [email or web address]

## 2022-02-14 NOTE — Plan of Care (Signed)
  Problem: Education: Goal: Understanding of CV disease, CV risk reduction, and recovery process will improve Outcome: Progressing Goal: Individualized Educational Video(s) Outcome: Progressing   Problem: Activity: Goal: Ability to return to baseline activity level will improve Outcome: Progressing   Problem: Cardiovascular: Goal: Ability to achieve and maintain adequate cardiovascular perfusion will improve Outcome: Progressing Goal: Vascular access site(s) Level 0-1 will be maintained Outcome: Progressing   Problem: Health Behavior/Discharge Planning: Goal: Ability to safely manage health-related needs after discharge will improve Outcome: Progressing   Problem: Education: Goal: Knowledge of General Education information will improve Description: Including pain rating scale, medication(s)/side effects and non-pharmacologic comfort measures Outcome: Progressing   Problem: Health Behavior/Discharge Planning: Goal: Ability to manage health-related needs will improve Outcome: Progressing   Problem: Clinical Measurements: Goal: Ability to maintain clinical measurements within normal limits will improve Outcome: Progressing Goal: Will remain free from infection Outcome: Progressing Goal: Diagnostic test results will improve Outcome: Progressing Goal: Respiratory complications will improve Outcome: Progressing Goal: Cardiovascular complication will be avoided Outcome: Progressing   Problem: Activity: Goal: Risk for activity intolerance will decrease Outcome: Progressing   Problem: Nutrition: Goal: Adequate nutrition will be maintained Outcome: Progressing   Problem: Coping: Goal: Level of anxiety will decrease Outcome: Progressing   Problem: Elimination: Goal: Will not experience complications related to bowel motility Outcome: Progressing Goal: Will not experience complications related to urinary retention Outcome: Progressing   Problem: Pain Managment: Goal:  General experience of comfort will improve Outcome: Progressing   Problem: Safety: Goal: Ability to remain free from injury will improve Outcome: Progressing   Problem: Skin Integrity: Goal: Risk for impaired skin integrity will decrease Outcome: Progressing   Problem: Education: Goal: Patient will understand all VAD equipment and how it functions Outcome: Progressing Goal: Patient will be able to verbalize current INR target range and antiplatelet therapy for discharge home Outcome: Progressing

## 2022-02-15 DIAGNOSIS — I50813 Acute on chronic right heart failure: Secondary | ICD-10-CM | POA: Diagnosis not present

## 2022-02-15 LAB — COMPREHENSIVE METABOLIC PANEL
ALT: 160 U/L — ABNORMAL HIGH (ref 0–44)
AST: 25 U/L (ref 15–41)
Albumin: 3.1 g/dL — ABNORMAL LOW (ref 3.5–5.0)
Alkaline Phosphatase: 95 U/L (ref 38–126)
Anion gap: 9 (ref 5–15)
BUN: 33 mg/dL — ABNORMAL HIGH (ref 8–23)
CO2: 27 mmol/L (ref 22–32)
Calcium: 9.1 mg/dL (ref 8.9–10.3)
Chloride: 102 mmol/L (ref 98–111)
Creatinine, Ser: 2.18 mg/dL — ABNORMAL HIGH (ref 0.61–1.24)
GFR, Estimated: 30 mL/min — ABNORMAL LOW (ref >60)
Glucose, Bld: 100 mg/dL — ABNORMAL HIGH (ref 70–99)
Potassium: 4.5 mmol/L (ref 3.5–5.1)
Sodium: 138 mmol/L (ref 135–145)
Total Bilirubin: 0.8 mg/dL (ref 0.3–1.2)
Total Protein: 6.1 g/dL — ABNORMAL LOW (ref 6.5–8.1)

## 2022-02-15 LAB — CBC
HCT: 30.2 % — ABNORMAL LOW (ref 39.0–52.0)
Hemoglobin: 9.5 g/dL — ABNORMAL LOW (ref 13.0–17.0)
MCH: 30.3 pg (ref 26.0–34.0)
MCHC: 31.5 g/dL (ref 30.0–36.0)
MCV: 96.2 fL (ref 80.0–100.0)
Platelets: 156 10*3/uL (ref 150–400)
RBC: 3.14 MIL/uL — ABNORMAL LOW (ref 4.22–5.81)
RDW: 19 % — ABNORMAL HIGH (ref 11.5–15.5)
WBC: 4.9 10*3/uL (ref 4.0–10.5)
nRBC: 0 % (ref 0.0–0.2)

## 2022-02-15 LAB — LACTATE DEHYDROGENASE: LDH: 169 U/L (ref 98–192)

## 2022-02-15 LAB — PROTIME-INR
INR: 2 — ABNORMAL HIGH (ref 0.8–1.2)
Prothrombin Time: 22.4 seconds — ABNORMAL HIGH (ref 11.4–15.2)

## 2022-02-15 LAB — MAGNESIUM: Magnesium: 2.2 mg/dL (ref 1.7–2.4)

## 2022-02-15 MED ORDER — DOXAZOSIN MESYLATE 4 MG PO TABS
4.0000 mg | ORAL_TABLET | Freq: Two times a day (BID) | ORAL | Status: DC
  Administered 2022-02-15: 4 mg via ORAL
  Filled 2022-02-15: qty 1

## 2022-02-15 MED ORDER — WARFARIN SODIUM 5 MG PO TABS
5.0000 mg | ORAL_TABLET | Freq: Once | ORAL | Status: AC
  Administered 2022-02-15: 5 mg via ORAL
  Filled 2022-02-15: qty 1

## 2022-02-15 NOTE — Progress Notes (Signed)
[place] for warfarin Indication:  LVAD  Allergies  Allergen Reactions   Ace Inhibitors Other (See Comments)    Hypotension and elevated creatinine   Amiodarone Other (See Comments)    Cerebellar side effects   Diovan [Valsartan] Other (See Comments)    Hypotension at low dose, hyperkalemia   Lipitor [Atorvastatin Calcium] Other (See Comments)    Muscle pain   Jardiance [Empagliflozin] Other (See Comments)    Unsure of exact side effect   Norvasc [Amlodipine] Other (See Comments)    Put patient to sleep    Patient Measurements: Height: 5\' 7"  (170.2 cm) Weight: 69.5 kg (153 lb 3.5 oz) IBW/kg (Calculated) : 66.1  Vital Signs: Temp: 98.1 F (36.7 C) (06/18 1150) Temp Source: Oral (06/18 1150) BP: 112/92 (06/18 1150) Pulse Rate: 79 (06/18 1150)  Labs: Recent Labs    02/13/22 0142 02/13/22 1813 02/14/22 0020 02/15/22 0054  HGB 7.9* 9.1* 9.1* 9.5*  HCT 26.3* 29.8* 28.1* 30.2*  PLT 184  --  154 156  LABPROT 23.1*  --  22.6* 22.4*  INR 2.1*  --  2.0* 2.0*  CREATININE 1.91*  --  2.00* 2.18*     Estimated Creatinine Clearance: 26.1 mL/min (A) (by C-G formula based on SCr of 2.18 mg/dL (H)).   Medical History: Past Medical History:  Diagnosis Date   Anxiety    Automatic implantable cardioverter-defibrillator in situ    CAD (coronary artery disease)    S/P stenting of LCX in 2004;  s/p Lat MI 2009 with occlusion of the LCX - treated with Promus stenting. Has total occlusion of the RCA.    CHF (congestive heart failure) (HCC)    Hypercholesteremia    Hypertension    Hypothyroidism    ICD (implantable cardiac defibrillator) in place    PROPHYLACTIC      medtronic   Inferior MI ([place]) "? date"; 2009   Ischemic cardiomyopathy    a. 10/09 Echo: Sev LV Dysfxn, inf/lat AK, Mod MR.   Liver disease    LVAD (left ventricular assist device) present ([place])    Osteoarthritis    a. s/p R TKR   Pacemaker    PVC (premature ventricular  contraction)    RBBB    Shortness of breath     Assessment: 81 yo male on chronic Coumadin for LVAD admitted with supratherapeutic INR. INR has been trending down after small dose of vitamin K on 6/12. Low dose warfarin restarted 6/14.    Of note, his INR goal is usually higher (2.5-3) due to elevations in LDH in the past. LDH currently 169.  Today's INR 2.0.  Goal of Therapy:  INR goal 2.5-3 Monitor platelets by anticoagulation protocol: Yes   Plan:  Warfarin 5 mg po again tonight. Daily INR.  [place] Crane, Roylene Reason, BCCP Clinical Pharmacist  02/15/2022 1:33 PM   [facility] pharmacy phone numbers are listed on [email or web address]

## 2022-02-15 NOTE — Progress Notes (Addendum)
Advanced Heart Failure VAD Team Note  PCP-Cardiologist: Dr. Haroldine Laws   Subjective:     6/12: Direct admit for inotropic support given concern for RV failure and shock liver. Started on milrinone. Given PO Vit K (INR 5.5)  6/12: 18 beat run of NSVT (K 2.7, Mg 1.9). Several low flow alarms on VAD  6/13 Florinef stopped.  6/15 RHC on milrinone with speed increased to 9800. Started on cardura and  RA = 13 RV = 51/17 PA = 47/23 (33) PCW = 25 (v = 40) Fick cardiac output/index = 4.3/2.4 PVR = 2.8 WU FA sat = 96% PA sat = 47%, 47% After hydralazine 20 IV @ 9800 Flow 4.4 L/min on VAD MAP 110  PCW = 18 (v = 25) Fick = 6.2/3.5 PA sat = 62%  Milrinone stopped yesterday. Feels ok. Denies SOB, orthopnea or PND. SCr 2.0- -> 2.18 MAPs 100-110  LVAD INTERROGATION:  HeartMate II LVAD:   Flow 3.9  liters/min, speed 9800, power 6.0  PI 5.3 Objective:    Vital Signs:   Temp:  [97.6 F (36.4 C)-98.7 F (37.1 C)] 98.1 F (36.7 C) (06/18 1150) Pulse Rate:  [64-82] 79 (06/18 1150) Resp:  [15-17] 17 (06/18 1150) BP: (108-124)/(90-109) 112/92 (06/18 1150) SpO2:  [95 %-100 %] 100 % (06/18 1150) Weight:  [69.5 kg] 69.5 kg (06/18 0456) Last BM Date : 02/13/22 Mean arterial Pressure 100-110  Intake/Output:   Intake/Output Summary (Last 24 hours) at 02/15/2022 1558 Last data filed at 02/15/2022 1200 Gross per 24 hour  Intake 717 ml  Output 1650 ml  Net -933 ml      Physical Exam   General:  NAD.  HEENT: normal  Neck: supple. JVP not elevated.  Carotids 2+ bilat; no bruits. No lymphadenopathy or thryomegaly appreciated. Cor: LVAD hum.  Lungs: Clear. Abdomen:  soft, nontender, non-distended. No hepatosplenomegaly. No bruits or masses. Good bowel sounds. Driveline site clean. Anchor in place.  Extremities: no cyanosis, clubbing, rash. Warm no edema  Neuro: alert & oriented x 3. No focal deficits. Moves all 4 without problem   Telemetry   AV paced 80s Personally reviewed  Labs    Basic Metabolic Panel: Recent Labs  Lab 02/09/22 0929 02/10/22 0258 02/10/22 0612 02/12/22 0103 02/12/22 1517 02/12/22 0905 02/12/22 1042 02/13/22 0142 02/14/22 0020 02/15/22 0054  NA 139  --    < > 141   < > 144 140 139 138 138  K 2.7* 4.0   < > 4.8   < > 3.5 3.4* 4.1 4.2 4.5  CL 105  --    < > 104  --   --  105 107 104 102  CO2 25  --    < > 29  --   --  26 24 25 27   GLUCOSE 121*  --    < > 114*  --   --  139* 122* 99 100*  BUN 29*  --    < > 32*  --   --  28* 30* 30* 33*  CREATININE 2.41*  --    < > 2.23*  --   --  1.99* 1.91* 2.00* 2.18*  CALCIUM 8.7*  --    < > 9.2  --   --  9.2 8.9 8.8* 9.1  MG 1.9 1.9  --   --   --   --   --  1.8  --  2.2   < > = values in this interval not displayed.  Liver Function Tests: Recent Labs  Lab 02/12/22 0103 02/12/22 1042 02/13/22 0142 02/14/22 0020 02/15/22 0054  AST 54* 48* 35 25 25  ALT 362* 341* 246* 201* 160*  ALKPHOS 121 105 111 98 95  BILITOT 1.3* 1.2 0.9 1.4* 0.8  PROT 6.0* 6.3* 5.4* 5.9* 6.1*  ALBUMIN 3.0* 3.2* 2.7* 2.9* 3.1*    No results for input(s): "LIPASE", "AMYLASE" in the last 168 hours. No results for input(s): "AMMONIA" in the last 168 hours.  CBC: Recent Labs  Lab 02/12/22 0103 02/12/22 0843 02/12/22 1042 02/13/22 0142 02/13/22 1813 02/14/22 0020 02/15/22 0054  WBC 6.8  --  8.1 5.1  --  5.5 4.9  HGB 8.3*   < > 9.1* 7.9* 9.1* 9.1* 9.5*  HCT 26.5*   < > 30.4* 26.3* 29.8* 28.1* 30.2*  MCV 96.7  --  95.9 96.7  --  94.6 96.2  PLT 197  --  224 184  --  154 156   < > = values in this interval not displayed.     INR: Recent Labs  Lab 02/12/22 0103 02/12/22 1042 02/13/22 0142 02/14/22 0020 02/15/22 0054  INR 2.2* 2.0* 2.1* 2.0* 2.0*     Other results:   Imaging   No results found.   Medications:     Scheduled Medications:  aspirin EC  81 mg Oral Daily   citalopram  10 mg Oral q morning   doxazosin  3 mg Oral Q12H   feeding supplement  237 mL Oral BID BM   Gerhardt's butt  cream   Topical Daily   levothyroxine  175 mcg Oral Once per day on Mon Tue Wed Thu Fri Sat   levothyroxine  87.5 mcg Oral Once per day on Sun   mexiletine  150 mg Oral BID   multivitamin with minerals  1 tablet Oral Daily   pantoprazole  40 mg Oral Daily   sodium chloride flush  3 mL Intravenous Q12H   sodium chloride flush  3 mL Intravenous Q12H   sotalol  80 mg Oral Daily   warfarin  5 mg Oral ONCE-1600   Warfarin - Pharmacist Dosing Inpatient   Does not apply q1600    Infusions:  sodium chloride Stopped (02/10/22 0820)   sodium chloride      PRN Medications: sodium chloride, sodium chloride, acetaminophen, melatonin, ondansetron (ZOFRAN) IV, sodium chloride flush   Patient Profile   81 y/o male w/ CAD, 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   - Echo 3/18 EF 10-15% mild AI. Mod RV dysfunction. LVAD cannula OK - Echo 4/23 EF 20-25%, RV mod reduced  - has struggled with RV failure however RHC 11/22 looked good (despite evidence of RVF on LE dopplers showing reflux - Now NYHA IV. Has not bounced back after his knee surgery. + weakness & anorexia. Concern for RV failure. Labs c/w shock  - PICC team unable to place PICC.  - RHC with primary left sided failure in setting of very high afterload. VAD speed increasd to 9800. Anti-HTN agents restarted - Milrinone stopped 6/17 - Maps still  running a bit high. Increase cardura to 4 mg twice a day. Can increase as needed - Failed Jardiance due  to volume depletion - He has not tolerated ACE-I or amlodipine or b-blocker in the past.   2) Weakness/anorexia - Due to shock  - Plan per above .    3) LVAD: HMII 08/2013 for DT. - VAD interrogated personally. Parameters stable. -  Admitted in May 2018 with elevated LDH with bival.  - 6/15 Speed increased to 9800  - LDH stable at 169 - INR 2.0 today.  Continue coumadin. . Continue ASA  Discussed dosing with PharmD personally. - DL site ok - MAPs as above   4) Orthostatic hypotension, post-op - Resolved. 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  - Off amio due to cerebellar side effects.  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 - VT  06/16/21 with  DC-CV x 1 - Recurrent VT 12/28/21 after stopping sotalol. Sotalol restarted. QT ok.  - PVCs improved on mexilitene. Continue mexilitene 150 bid - Keep K > 4.0 Mg > 2.0.  - Occasional runs VT. Watch closely    6) Atrial flutter, recurrent - patient paced out of AFL in 6/22. Atrial therapies enabled  - continue sotalol (on for VT). QTs slightly prolonged but stable - Remains in NSR   7) Chronic anticoagulation: INR goal 2.0-3.0.    - INR 5.5 on admit.  Suspect RV failure  - INR down w/ Vit K, 2.0 today. Continue warfarin. Discussed dosing with PharmD personally. - Continue ASA 81 mg for LVAD   8) HTN:  - As above Increase cardura 3 mg twice a day. Titrate as needed   9) Hypothyroidism:  - on levothyroxine. TFTs ok  - Followed by Dr Dwyane Dee.    10) Hyperlipidemia:  - Continue Crestor. Zetia stopped due to cost.  - No change.    11) CAD -no s/s ischemia  - continue Crestor   12) AKI on CKD IV - Creatinine baseline 1.7-2.0 - SCr 2.3 in setting of low output/cardiorenal  - Creatinine stable 2.0 - Milrinone support per above      13) Chronic Anemia  - Hgb 9.1>7.9 . No obvious source.  - May need to stop aspirin.  - Gave1 UPRBCs on 6/16 Hgb 9.5 today - Follow daily CBC.    14) Shock Liver - Suspect 2/2 RV failure  - Resolvedw/ milrinone  - AST 537 peak - ALT 1003 peak    I reviewed the LVAD parameters from today, and compared the results to the patient's prior recorded data.  No programming changes were made.  The LVAD is  functioning within specified parameters.  The patient performs LVAD self-test daily.  LVAD interrogation was negative for any significant power changes, alarms or PI events/speed drops.  LVAD equipment check completed and is in good working order.  Back-up equipment present.   LVAD education done on emergency procedures and precautions and reviewed exit site care.  Length of Stay: [place], MD 02/15/2022, 3:58 PM  VAD Team --- VAD ISSUES ONLY--- Pager [phone] (7am - 7am)  Advanced Heart Failure Team  Pager [phone] (M-F; 7a - 5p)  Please contact [facility] for night-coverage after hours (5p -7a ) and weekends on [email or web address]   3:58 PM

## 2022-02-15 NOTE — Progress Notes (Signed)
Mobility Specialist: Progress Note   02/15/22 1700  Mobility  Activity Ambulated with assistance in hallway  Level of Assistance Standby assist, set-up cues, supervision of patient - no hands on  Assistive Device Cane  RLE Weight Bearing WBAT  Distance Ambulated (ft) 500 ft  Activity Response Tolerated well  $Mobility charge 1 Mobility   Received pt in bed having no complaints and agreeable to mobility. Pt was asymptomatic throughout ambulation and returned to room w/o fault. Left in chair w/ call bell in reach and all needs met. Pt requesting to remain on batteries, RN made aware.   [employer] Vernis [place] Mobility Specialist Mobility Specialist [address]: [phone]

## 2022-02-15 NOTE — Plan of Care (Signed)
  Problem: Education: Goal: Understanding of CV disease, CV risk reduction, and recovery process will improve Outcome: Progressing Goal: Individualized Educational Video(s) Outcome: Progressing   Problem: Activity: Goal: Ability to return to baseline activity level will improve Outcome: Progressing   Problem: Cardiovascular: Goal: Ability to achieve and maintain adequate cardiovascular perfusion will improve Outcome: Progressing Goal: Vascular access site(s) Level 0-1 will be maintained Outcome: Progressing   Problem: Health Behavior/Discharge Planning: Goal: Ability to safely manage health-related needs after discharge will improve Outcome: Progressing   Problem: Education: Goal: Knowledge of General Education information will improve Description: Including pain rating scale, medication(s)/side effects and non-pharmacologic comfort measures Outcome: Progressing   Problem: Health Behavior/Discharge Planning: Goal: Ability to manage health-related needs will improve Outcome: Progressing   Problem: Clinical Measurements: Goal: Ability to maintain clinical measurements within normal limits will improve Outcome: Progressing Goal: Will remain free from infection Outcome: Progressing Goal: Diagnostic test results will improve Outcome: Progressing Goal: Respiratory complications will improve Outcome: Progressing Goal: Cardiovascular complication will be avoided Outcome: Progressing   Problem: Activity: Goal: Risk for activity intolerance will decrease Outcome: Progressing   Problem: Nutrition: Goal: Adequate nutrition will be maintained Outcome: Progressing   Problem: Coping: Goal: Level of anxiety will decrease Outcome: Progressing   Problem: Elimination: Goal: Will not experience complications related to bowel motility Outcome: Progressing Goal: Will not experience complications related to urinary retention Outcome: Progressing   Problem: Pain Managment: Goal:  General experience of comfort will improve Outcome: Progressing   Problem: Safety: Goal: Ability to remain free from injury will improve Outcome: Progressing   Problem: Skin Integrity: Goal: Risk for impaired skin integrity will decrease Outcome: Progressing   Problem: Education: Goal: Patient will understand all VAD equipment and how it functions Outcome: Progressing Goal: Patient will be able to verbalize current INR target range and antiplatelet therapy for discharge home Outcome: Progressing

## 2022-02-16 ENCOUNTER — Other Ambulatory Visit (HOSPITAL_COMMUNITY): Payer: Self-pay

## 2022-02-16 DIAGNOSIS — I50813 Acute on chronic right heart failure: Secondary | ICD-10-CM | POA: Diagnosis not present

## 2022-02-16 LAB — CBC
HCT: 33.9 % — ABNORMAL LOW (ref 39.0–52.0)
Hemoglobin: 10.8 g/dL — ABNORMAL LOW (ref 13.0–17.0)
MCH: 30.6 pg (ref 26.0–34.0)
MCHC: 31.9 g/dL (ref 30.0–36.0)
MCV: 96 fL (ref 80.0–100.0)
Platelets: 171 10*3/uL (ref 150–400)
RBC: 3.53 MIL/uL — ABNORMAL LOW (ref 4.22–5.81)
RDW: 18.5 % — ABNORMAL HIGH (ref 11.5–15.5)
WBC: 5.1 10*3/uL (ref 4.0–10.5)
nRBC: 0 % (ref 0.0–0.2)

## 2022-02-16 LAB — TYPE AND SCREEN
ABO/RH(D): O NEG
Antibody Screen: POSITIVE
DAT, IgG: POSITIVE
Unit division: 0
Unit division: 0

## 2022-02-16 LAB — LACTATE DEHYDROGENASE: LDH: 214 U/L — ABNORMAL HIGH (ref 98–192)

## 2022-02-16 LAB — COMPREHENSIVE METABOLIC PANEL
ALT: 140 U/L — ABNORMAL HIGH (ref 0–44)
AST: 28 U/L (ref 15–41)
Albumin: 3.5 g/dL (ref 3.5–5.0)
Alkaline Phosphatase: 114 U/L (ref 38–126)
Anion gap: 11 (ref 5–15)
BUN: 34 mg/dL — ABNORMAL HIGH (ref 8–23)
CO2: 26 mmol/L (ref 22–32)
Calcium: 9.2 mg/dL (ref 8.9–10.3)
Chloride: 101 mmol/L (ref 98–111)
Creatinine, Ser: 2.07 mg/dL — ABNORMAL HIGH (ref 0.61–1.24)
GFR, Estimated: 32 mL/min — ABNORMAL LOW (ref >60)
Glucose, Bld: 110 mg/dL — ABNORMAL HIGH (ref 70–99)
Potassium: 4.1 mmol/L (ref 3.5–5.1)
Sodium: 138 mmol/L (ref 135–145)
Total Bilirubin: 0.8 mg/dL (ref 0.3–1.2)
Total Protein: 6.5 g/dL (ref 6.5–8.1)

## 2022-02-16 LAB — BPAM RBC
Blood Product Expiration Date: 202306232359
Blood Product Expiration Date: 202306272359
ISSUE DATE / TIME: 202306161346
Unit Type and Rh: 9500
Unit Type and Rh: 9500

## 2022-02-16 LAB — PROTIME-INR
INR: 1.8 — ABNORMAL HIGH (ref 0.8–1.2)
Prothrombin Time: 20.7 seconds — ABNORMAL HIGH (ref 11.4–15.2)

## 2022-02-16 MED ORDER — HYDRALAZINE HCL 25 MG PO TABS
25.0000 mg | ORAL_TABLET | Freq: Two times a day (BID) | ORAL | 3 refills | Status: DC
  Filled 2022-02-16: qty 60, 30d supply, fill #0

## 2022-02-16 MED ORDER — HYDRALAZINE HCL 25 MG PO TABS
25.0000 mg | ORAL_TABLET | Freq: Two times a day (BID) | ORAL | Status: DC
  Administered 2022-02-16: 25 mg via ORAL
  Filled 2022-02-16: qty 1

## 2022-02-16 MED ORDER — WARFARIN SODIUM 5 MG PO TABS: ORAL_TABLET | ORAL | 11 refills | Status: DC

## 2022-02-16 MED ORDER — DOXAZOSIN MESYLATE 4 MG PO TABS
4.0000 mg | ORAL_TABLET | Freq: Two times a day (BID) | ORAL | 3 refills | Status: DC
  Filled 2022-02-16: qty 60, 30d supply, fill #0

## 2022-02-16 MED ORDER — DOXAZOSIN MESYLATE 8 MG PO TABS
4.0000 mg | ORAL_TABLET | Freq: Two times a day (BID) | ORAL | 6 refills | Status: DC
  Filled 2022-02-16: qty 60, 60d supply, fill #0

## 2022-02-16 MED ORDER — WARFARIN SODIUM 3 MG PO TABS
6.0000 mg | ORAL_TABLET | Freq: Once | ORAL | Status: DC
Start: 2022-02-16 — End: 2022-02-16

## 2022-02-16 MED ORDER — DOXAZOSIN MESYLATE 8 MG PO TABS
8.0000 mg | ORAL_TABLET | Freq: Two times a day (BID) | ORAL | 6 refills | Status: DC
  Filled 2022-02-16: qty 60, 30d supply, fill #0

## 2022-02-16 MED ORDER — DOXAZOSIN MESYLATE 4 MG PO TABS
8.0000 mg | ORAL_TABLET | Freq: Two times a day (BID) | ORAL | Status: DC
Start: 2022-02-16 — End: 2022-02-16
  Administered 2022-02-16: 8 mg via ORAL
  Filled 2022-02-16: qty 2

## 2022-02-16 MED ORDER — WARFARIN SODIUM 3 MG PO TABS
6.0000 mg | ORAL_TABLET | Freq: Once | ORAL | Status: AC
Start: 2022-02-16 — End: 2022-02-16
  Administered 2022-02-16: 6 mg via ORAL
  Filled 2022-02-16: qty 2

## 2022-02-16 NOTE — TOC Initial Note (Addendum)
Transition of Care [facility]) - Initial/Assessment Note    Patient Details  Name: Johnny Oneill MRN: [id number] Date of Birth: Jan 10, 1941  [facility] CM/SW Contact:    Erenest Rasher, RN Phone Number: [phone] 02/16/2022, 11:30 AM  Clinical Narrative:                 HF TOC CM contacted [place], [place] to make aware of dc home today with resumption of HH. Pt wife will provide transportation. Has DME at home.   Expected Discharge Plan: [place] Barriers to Discharge: No Barriers Identified   Patient Goals and CMS Choice Patient states their goals for this hospitalization and ongoing recovery are:: wants to remain independent CMS Medicare.gov Compare Post Acute Care list provided to:: Patient Choice offered to / list presented to : Patient  Expected Discharge Plan and Services Expected Discharge Plan: [place]   Discharge Planning Services: CM Consult Post Acute Care Choice: [place] arrangements for the past 2 months: Single Family Home Expected Discharge Date: 02/16/22                           [facility] Agency: [place] Date [place]: 02/16/22 Time [place]: 1130 Representative spoke with at [place]: Romie Jumper  Prior Living Arrangements/Services Living arrangements for the past 2 months: [place] with:: Spouse Patient language and need for interpreter reviewed:: Yes Do you feel safe going back to the place where you live?: Yes      Need for Family Participation in Patient Care: Yes (Comment) Care giver support system in place?: Yes (comment) Current home services: DME ([employer], [employer]) Criminal Activity/Legal Involvement Pertinent to Current Situation/Hospitalization: No - Comment as needed  Activities of Daily Living Home Assistive Devices/Equipment: Cane (specify quad or straight), Walker (specify type) ADL Screening (condition at  time of admission) Patient's cognitive ability adequate to safely complete daily activities?: Yes Is the patient deaf or have difficulty hearing?: No Does the patient have difficulty seeing, even when wearing glasses/contacts?: No Does the patient have difficulty concentrating, remembering, or making decisions?: No Patient able to express need for assistance with ADLs?: Yes Does the patient have difficulty dressing or bathing?: No Independently performs ADLs?: Yes (appropriate for developmental age) Does the patient have difficulty walking or climbing stairs?: No Weakness of Legs: Right Weakness of Arms/Hands: None  Permission Sought/Granted Permission sought to share information with : Case Manager, Family Supports, PCP Permission granted to share information with : Yes, Verbal Permission Granted  Share Information with NAME: Mitchell Iwanicki  Permission granted to share info w AGENCY: [place] granted to share info w Relationship: wife  Permission granted to share info w Contact Information: [phone]  Emotional Assessment Appearance:: Appears stated age Attitude/Demeanor/Rapport: Engaged Affect (typically observed): Accepting Orientation: : Oriented to Self, Oriented to Place, Oriented to  Time, Oriented to Situation   Psych Involvement: No (comment)  Admission diagnosis:  Acute on chronic right-sided heart failure ([place]) [I50.813] Patient Active Problem List   Diagnosis Date Noted   Malnutrition of moderate degree 02/10/2022   Acute on chronic right-sided heart failure ([place]) 02/09/2022   Syncope and collapse 12/18/2021   CKD (chronic kidney disease), stage III ([place]) 65/78/4696   Chronic systolic CHF (congestive heart failure) ([place]) 01/02/2015   Decreased appetite 11/01/2014   Contact dermatitis 09/11/2014  Inguinal hernia 08/21/2014   Hemothorax 02/08/2014   HTN (hypertension) 01/30/2014   Chronic anticoagulation 01/08/2014   Hypothyroid 12/20/2013   Syncope  11/28/2013   Hemothorax on right 11/06/2013   Poor venous access 11/02/2013   LVAD (left ventricular assist device) present ([place]) 09/18/2013   Weakness generalized 09/15/2013   Nonspecific (abnormal) findings on radiological and other examination of gastrointestinal tract 09/14/2013   Anemia, unspecified 09/14/2013   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   PVC (premature ventricular contraction) 12/08/2012   VT (ventricular tachycardia) 12/08/2012   Essential hypertension, benign 11/12/2010   ISCHEMIC CARDIOMYOPATHY 16/38/4665   Chronic systolic heart failure ([place]) 11/12/2010   Ischemic cardiomyopathy    Myocardial infarction of lateral wall ([place])    Hypercholesteremia    Automatic implantable cardioverter-defibrillator in situ    CAD (coronary artery disease)    Osteoarthritis    PCP:  Jolaine Artist, MD Pharmacy:   [place], [address] [place] [address] Phone: [phone] Fax: [phone]     Social Determinants of Health (SDOH) Interventions    Readmission Risk Interventions     No data to display

## 2022-02-16 NOTE — Progress Notes (Addendum)
Advanced Heart Failure VAD Team Note  PCP-Cardiologist: Dr. Haroldine Laws   Subjective:     6/12: Direct admit for inotropic support given concern for RV failure and shock liver. Started on milrinone. Given PO Vit K (INR 5.5)  6/12: 18 beat run of NSVT (K 2.7, Mg 1.9). Several low flow alarms on VAD  6/13 Florinef stopped.  6/15 RHC on milrinone with speed increased to 9800. Started on cardura and  RA = 13 RV = 51/17 PA = 47/23 (33) PCW = 25 (v = 40) Fick cardiac output/index = 4.3/2.4 PVR = 2.8 WU FA sat = 96% PA sat = 47%, 47% After hydralazine 20 IV @ 9800 Flow 4.4 L/min on VAD MAP 110  PCW = 18 (v = 25) Fick = 6.2/3.5 PA sat = 62%   Off milrinone 6/17   SCr 2.0- -> 2.18->2.07  MAPs 110    Denies SOB. Walked 2 times without difficulty.   LVAD INTERROGATION:  HeartMate II LVAD:   Flow 4.5 liters/min, speed 9800, power 6.0  PI 5.1  Rare PI events.  Objective:    Vital Signs:   Temp:  [98 F (36.7 C)-98.5 F (36.9 C)] 98.5 F (36.9 C) (06/19 0537) Pulse Rate:  [74-84] 84 (06/19 0537) Resp:  [14-19] 18 (06/19 0537) BP: (112-129)/(92-109) 129/107 (06/19 0537) SpO2:  [97 %-100 %] 97 % (06/19 0537) Last BM Date : 02/13/22 Mean arterial Pressure 110s   Intake/Output:   Intake/Output Summary (Last 24 hours) at 02/16/2022 0713 Last data filed at 02/15/2022 1658 Gross per 24 hour  Intake 480 ml  Output 950 ml  Net -470 ml     Physical Exam  Physical Exam: GENERAL: No acute distress. HEENT: normal  NECK: Supple, JVP flat .  2+ bilaterally, no bruits.  No lymphadenopathy or thyromegaly appreciated.   CARDIAC:  Mechanical heart sounds with LVAD hum present.  LUNGS:  Clear to auscultation bilaterally.  ABDOMEN:  Soft, round, nontender, positive bowel sounds x4.     LVAD exit site: well-healed and incorporated.  Dressing dry and intact.  No erythema or drainage.  Stabilization device present and accurately applied.  Driveline dressing is being changed daily per  sterile technique. EXTREMITIES:  Warm and dry, no cyanosis, clubbing, rash or edema  NEUROLOGIC:  Alert and oriented x 3.    No aphasia.  No dysarthria.  Affect pleasant.     Telemetry  AV paced 80s   Labs   Basic Metabolic Panel: Recent Labs  Lab 02/09/22 0929 02/10/22 0258 02/10/22 0612 02/12/22 1042 02/13/22 0142 02/14/22 0020 02/15/22 0054 02/16/22 0122  NA 139  --    < > 140 139 138 138 138  K 2.7* 4.0   < > 3.4* 4.1 4.2 4.5 4.1  CL 105  --    < > 105 107 104 102 101  CO2 25  --    < > 26 24 25 27 26   GLUCOSE 121*  --    < > 139* 122* 99 100* 110*  BUN 29*  --    < > 28* 30* 30* 33* 34*  CREATININE 2.41*  --    < > 1.99* 1.91* 2.00* 2.18* 2.07*  CALCIUM 8.7*  --    < > 9.2 8.9 8.8* 9.1 9.2  MG 1.9 1.9  --   --  1.8  --  2.2  --    < > = values in this interval not displayed.    Liver Function Tests: Recent Labs  Lab 02/12/22 1042 02/13/22 0142 02/14/22 0020 02/15/22 0054 02/16/22 0122  AST 48* 35 25 25 28   ALT 341* 246* 201* 160* 140*  ALKPHOS 105 111 98 95 114  BILITOT 1.2 0.9 1.4* 0.8 0.8  PROT 6.3* 5.4* 5.9* 6.1* 6.5  ALBUMIN 3.2* 2.7* 2.9* 3.1* 3.5   No results for input(s): "LIPASE", "AMYLASE" in the last 168 hours. No results for input(s): "AMMONIA" in the last 168 hours.  CBC: Recent Labs  Lab 02/12/22 1042 02/13/22 0142 02/13/22 1813 02/14/22 0020 02/15/22 0054 02/16/22 0122  WBC 8.1 5.1  --  5.5 4.9 5.1  HGB 9.1* 7.9* 9.1* 9.1* 9.5* 10.8*  HCT 30.4* 26.3* 29.8* 28.1* 30.2* 33.9*  MCV 95.9 96.7  --  94.6 96.2 96.0  PLT 224 184  --  154 156 171    INR: Recent Labs  Lab 02/12/22 1042 02/13/22 0142 02/14/22 0020 02/15/22 0054 02/16/22 0122  INR 2.0* 2.1* 2.0* 2.0* 1.8*    Other results:   Imaging   No results found.   Medications:     Scheduled Medications:  aspirin EC  81 mg Oral Daily   citalopram  10 mg Oral q morning   doxazosin  4 mg Oral Q12H   feeding supplement  237 mL Oral BID BM   Gerhardt's butt cream    Topical Daily   levothyroxine  175 mcg Oral Once per day on Mon Tue Wed Thu Fri Sat   levothyroxine  87.5 mcg Oral Once per day on Sun   mexiletine  150 mg Oral BID   multivitamin with minerals  1 tablet Oral Daily   pantoprazole  40 mg Oral Daily   sodium chloride flush  3 mL Intravenous Q12H   sodium chloride flush  3 mL Intravenous Q12H   sotalol  80 mg Oral Daily   Warfarin - Pharmacist Dosing Inpatient   Does not apply q1600    Infusions:  sodium chloride Stopped (02/10/22 0820)   sodium chloride      PRN Medications: sodium chloride, sodium chloride, acetaminophen, melatonin, ondansetron (ZOFRAN) IV, sodium chloride flush   Patient Profile   81 y/o male w/ CAD, 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   - Echo 3/18 EF 10-15% mild AI. Mod RV dysfunction. LVAD cannula OK - Echo 4/23 EF 20-25%, RV mod reduced  - has struggled with RV failure however RHC 11/22 looked good (despite evidence of RVF on LE dopplers showing reflux - Now NYHA IV. Has not bounced back after his knee surgery. + weakness & anorexia. Concern for RV failure. Labs c/w shock  - PICC team unable to place PICC.  - RHC with primary left sided failure in setting of very high afterload. VAD speed increasd to 9800. Anti-HTN agents restarted - Milrinone stopped 6/17 - Volume status ok.  - Maps still  running a bit high. Increase cardura to 8 mg twice a day. Can increase as needed - Failed Jardiance due to volume depletion - He has not tolerated ACE-I or amlodipine or b-blocker in the past.   2) Weakness/anorexia - Due to shock  - Plan per above .    3) LVAD: HMII 08/2013 for DT. - VAD interrogated  personally. Parameters stable. - Admitted in May 2018 with  elevated LDH with bival.  - 6/15 Speed increased to 9800  - LDH stable at 214 - INR 1.8 today.  Continue coumadin. . Continue ASA  Discussed dosing with PharmD personally. - DL site ok - MAPs as above   4) Orthostatic hypotension, post-op - Resolved. 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  - Off amio due to cerebellar side effects.  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 - VT  06/16/21 with  DC-CV x 1 - Recurrent VT 12/28/21 after stopping sotalol. Sotalol restarted. QT ok.  - PVCs improved on mexilitene. Continue mexilitene 150 bid - Keep K > 4.0 Mg > 2.0.  - Occasional runs VT. Watch closely    6) Atrial flutter, recurrent - patient paced out of AFL in 6/22. Atrial therapies enabled  - continue sotalol (on for VT). QTs slightly prolonged but stable - Remains in NSR   7) Chronic anticoagulation: INR goal 2.0-3.0.    - INR 5.5 on admit.  Suspect RV failure  - INR down w/ Vit K, 1.8  today. Continue warfarin. Discussed dosing with PharmD personally. - Continue ASA 81 mg for LVAD   8) HTN:  - As above Increase cardura 3 mg twice a day. Titrate as needed   9) Hypothyroidism:  - on levothyroxine. TFTs ok  - Followed by Dr Dwyane Dee.    10) Hyperlipidemia:  - Continue Crestor. Zetia stopped due to cost.  - No change.    11) CAD -no s/s ischemia  - continue Crestor   12) AKI on CKD IV - Creatinine baseline 1.7-2.0 - SCr 2.3 in setting of low output/cardiorenal  - Creatinine stable 2.07 - Milrinone support per above      13) Chronic Anemia  - Hgb 9.1>7.9 . No obvious source.  - May need to stop aspirin.  - Gave1 UPRBCs on 6/16 Hgb  10.8  today   14) Shock Liver - Suspect 2/2 RV failure  - Resolvedw/ milrinone  - AST 537 peak - ALT 1003 peak   Looks great.   I reviewed the LVAD parameters from today, and compared the results to the patient's prior recorded data.  No programming changes were made.  The LVAD is functioning within specified  parameters.  The patient performs LVAD self-test daily.  LVAD interrogation was negative for any significant power changes, alarms or PI events/speed drops.  LVAD equipment check completed and is in good working order.  Back-up equipment present.   LVAD education done on emergency procedures and precautions and reviewed exit site care.  Length of Stay: Hatch, NP 02/16/2022, 7:13 AM  VAD Team --- VAD ISSUES ONLY--- Pager [phone] (7am - 7am)  Advanced Heart Failure Team  Pager [phone] (M-F; 7a - 5p)  Please contact [facility] for night-coverage after hours (5p -7a ) and weekends on [email or web address]  Patient seen and examined with the above-signed Advanced Practice Provider and/or Housestaff. I personally reviewed laboratory data, imaging studies and relevant notes. I independently examined the patient and formulated the important aspects of the plan. I have edited the note to reflect any of my changes or salient points. I have personally discussed the plan with the patient and/or family.  Feels good. Stable off milrinone. MAPs still high. VAD flows ok.   General:  NAD.  HEENT: normal  Neck: supple. JVP not elevated.  Carotids 2+ bilat; no bruits. No lymphadenopathy or thryomegaly  appreciated. Cor: LVAD hum.  Lungs: Clear. Abdomen: soft, nontender, non-distended. No hepatosplenomegaly. No bruits or masses. Good bowel sounds. Driveline site clean. Anchor in place.  Extremities: no cyanosis, clubbing, rash. Warm no edema  Neuro: alert & oriented x 3. No focal deficits. Moves all 4 without problem   Improved. MAPs still high but don't seem to be improving with increasing doses of doxazosin. Would keep cardura at 4 bid add hydral 25 bid (failed amlodipine in past).   VAD interrogated personally. Parameters stable. INR 1.8 Discussed dosing with PharmD personally.  I think he can go home today with close f/u.  Glori Bickers, MD  9:39 AM

## 2022-02-16 NOTE — Progress Notes (Signed)
LVAD Coordinator Rounding Note:  Direct admit 02/09/22 from home following clinic visit.  Pt admitted with RV failure and shock liver.  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 Darcey [name] under bridge to transplant criteria. Patient was removed from transplant list 05/2016 per his choice. He is currently Destination Therapy LVAD.   Pt sitting up in bed upon my arrival. States he feels good, and is ready to go home. Seems to be tolerating new speed.  Milrinone stopped 02/14/22. BP remains elevated. Cardura increased to 4 mg BID with Hydralazine 25 mg BID.    Will plan to check INR & BP in clinic on Thursday 02/19/22 @ 1030. VAD clinic f/u w/ Dr Haroldine Laws 02/24/22 @ 0900.   Vital signs       Temp: 97.6 HR: 84  Doppler Pressure: 110     Automatic BP: 122/103 (111)   O2 Sat: 94% on RA Wt: 158>149.4>150.7>150.5>152.3 lbs  LVAD interrogation reveals:  Speed: 9800    Flow: 3.9    Power: 5.6 w   PI: 6.5     Alarms: none  Events: none so far today today; 62 PI events on 6/16  Fixed speed:  9800 Low speed limit: 9200  Drive Line:  Drive line is being maintained weekly by [name], his wife. Existing VAD dressing clean, dry, and intact. Drive line anchor correctly applied. Weekly dressing changes per [facility] nurse or caregiver. Next dressing change due 02/17/22.   Labs:  LDH trend: 450>287>243>196>210>214  INR trend: 5.5>3.6>2.5>2>2.1>1.8  AST/ALT: 537/1003>227/760>105/530>48/341>35/246>28/140  Hgb: 9.1>7.9>10.8  Anticoagulation Plan: -INR Goal:  2.5 - 3.0 - Currently holding -ASA Dose: 81 mg daily  Blood Products:  02/13/22> 1 u PRBC  Device: Medtronic dual - Therapies: on 231 bpm - Pacing:  AAI<=>DDD 80 bpm - Last check: in ED per [employer] rep on 12/18/21 - rate response turned on   Plan/Recommendations:  Call VAD Coordinator with any VAD equipment or drive line issues. Weekly dressing changes per [facility] nurse or caregiver.  For discharge home today per Dr Haroldine Laws- BP/INR check on  02/19/22 @ 1030 & VAD clinic f/u 02/24/22 @ 0900   Emerson Monte RN [place] Coordinator  Office: [phone]  24/7 Pager: [phone]

## 2022-02-16 NOTE — Progress Notes (Signed)
Physical Therapy Treatment/ Discharge Patient Details Name: Johnny Oneill MRN: [id number] DOB: 1941-02-26 Today's Date: 02/16/2022   History of Present Illness Pt is a 81 y.o. male who presented 02/09/22 with concern for RV failure and shock liver. S/p R cardiac cath 6/15. PMhx: CKD, CHF, HTN, LVAD HM II 2015, HLD, CAD, Lt TKA, ICD, R TKA April 2023    PT Comments    Pt very pleasant and eager to return home. Pt able to walk with shoes and SPC this session with one mild LOB and able to recover without physical assist. Pt performing all basic transfers, gait and stairs without assist and states he will decline OPPT for balance training. Encouraged [facility] for community due to poor use of all 4 legs on ground with quad cane during prior sessions. No further acute needs with encouragement to mobilize with staff and mobility specialist. Pt agreeable, will sign off.   HR 92-95 Speed 9800, flow 4.5-5.5, PI 5.9-6.7, power 5.8-6.7 BP 128/106     Recommendations for follow up therapy are one component of a multi-disciplinary discharge planning process, led by the attending physician.  Recommendations may be updated based on patient status, additional functional criteria and insurance authorization.  Follow Up Recommendations  Outpatient PT (pt reports declines OPPT for balance)     Assistance Recommended at Discharge PRN  Patient can return home with the following Assistance with cooking/housework   Equipment Recommendations  None recommended by PT    Recommendations for Other Services       Precautions / Restrictions Precautions Precautions: Fall;Other (comment) Precaution Comments: LVAD, watch HR and BP Restrictions Weight Bearing Restrictions: No RLE Weight Bearing: Weight bearing as tolerated     Mobility  Bed Mobility Overal bed mobility: Modified Independent             General bed mobility comments: bed flat without physical assist. able to don socks, shoes and power  transition EOB without assist    Transfers Overall transfer level: Modified independent                      Ambulation/Gait Ambulation/Gait assistance: Supervision Gait Distance (Feet): 450 Feet Assistive device: Straight cane Gait Pattern/deviations: Decreased stride length, Step-through pattern   Gait velocity interpretation: >2.62 ft/sec, indicative of community ambulatory   General Gait Details: pt with generally steady gait with limited use of SPC during gait with cues for approrpriate use as needed. Pt with one partial LOB but able to recover without assist   Stairs Stairs: Yes Stairs assistance: Modified independent (Device/Increase time) Stair Management: One rail Right, Alternating pattern, Forwards Number of Stairs: 11 General stair comments: pt ascended with alternating pattern and descended with step to pattern, cues for cane positioning   Wheelchair Mobility    Modified Rankin (Stroke Patients Only)       Balance Overall balance assessment: Needs assistance   Sitting balance-Leahy Scale: Good     Standing balance support: No upper extremity supported, Single extremity supported Standing balance-Leahy Scale: Good Standing balance comment: intermittent SPC use                            Cognition Arousal/Alertness: Awake/alert Behavior During Therapy: WFL for tasks assessed/performed Overall Cognitive Status: Within Functional Limits for tasks assessed  Exercises      General Comments        Pertinent Vitals/Pain Pain Assessment Pain Assessment: No/denies pain    Home Living                          Prior Function            PT Goals (current goals can now be found in the care plan section) Progress towards PT goals: Goals met/education completed, patient discharged from PT    Frequency           PT Plan Current plan remains appropriate     Co-evaluation              AM-PAC PT "6 Clicks" Mobility   Outcome Measure  Help needed turning from your back to your side while in a flat bed without using bedrails?: None Help needed moving from lying on your back to sitting on the side of a flat bed without using bedrails?: None Help needed moving to and from a bed to a chair (including a wheelchair)?: None Help needed standing up from a chair using your arms (e.g., wheelchair or bedside chair)?: None Help needed to walk in hospital room?: A Little Help needed climbing 3-5 steps with a railing? : None 6 Click Score: 23    End of Session   Activity Tolerance: Patient tolerated treatment well Patient left: in chair;with call bell/phone within reach Nurse Communication: Mobility status PT Visit Diagnosis: Other abnormalities of gait and mobility (R26.89)     Time: 8299-3716 PT Time Calculation (min) (ACUTE ONLY): 18 min  Charges:  $Gait Training: 8-22 mins                     Bayard Males, PT Acute Rehabilitation Services Office: [phone]    Lamarr Lulas 02/16/2022, 9:36 AM

## 2022-02-16 NOTE — Progress Notes (Signed)
[place] for warfarin Indication:  LVAD  Allergies  Allergen Reactions   Ace Inhibitors Other (See Comments)    Hypotension and elevated creatinine   Amiodarone Other (See Comments)    Cerebellar side effects   Diovan [Valsartan] Other (See Comments)    Hypotension at low dose, hyperkalemia   Lipitor [Atorvastatin Calcium] Other (See Comments)    Muscle pain   Jardiance [Empagliflozin] Other (See Comments)    Unsure of exact side effect   Norvasc [Amlodipine] Other (See Comments)    Put patient to sleep    Patient Measurements: Height: 5\' 7"  (170.2 cm) Weight: 69.1 kg (152 lb 5.4 oz) IBW/kg (Calculated) : 66.1  Vital Signs: Temp: 97.6 F (36.4 C) (06/19 0748) Temp Source: Oral (06/19 0748) BP: 128/106 (06/19 0748) Pulse Rate: 84 (06/19 0748)  Labs: Recent Labs    02/14/22 0020 02/15/22 0054 02/16/22 0122  HGB 9.1* 9.5* 10.8*  HCT 28.1* 30.2* 33.9*  PLT 154 156 171  LABPROT 22.6* 22.4* 20.7*  INR 2.0* 2.0* 1.8*  CREATININE 2.00* 2.18* 2.07*     Estimated Creatinine Clearance: 27.5 mL/min (A) (by C-G formula based on SCr of 2.07 mg/dL (H)).   Medical History: Past Medical History:  Diagnosis Date   Anxiety    Automatic implantable cardioverter-defibrillator in situ    CAD (coronary artery disease)    S/P stenting of LCX in 2004;  s/p Lat MI 2009 with occlusion of the LCX - treated with Promus stenting. Has total occlusion of the RCA.    CHF (congestive heart failure) (HCC)    Hypercholesteremia    Hypertension    Hypothyroidism    ICD (implantable cardiac defibrillator) in place    PROPHYLACTIC      medtronic   Inferior MI ([place]) "? date"; 2009   Ischemic cardiomyopathy    a. 10/09 Echo: Sev LV Dysfxn, inf/lat AK, Mod MR.   Liver disease    LVAD (left ventricular assist device) present ([place])    Osteoarthritis    a. s/p R TKR   Pacemaker    PVC (premature ventricular contraction)    RBBB    Shortness of  breath     Assessment: 81 yo male on chronic Coumadin for LVAD admitted with supratherapeutic INR. INR has been trending down after small dose of vitamin K on 6/12. Low dose warfarin restarted 6/14.    Of note, his INR goal is usually higher (2.5-3) due to elevations in LDH in the past. LDH up slightly to 214.  Today's INR down slightly to 1.8, hgb up to 10.8. No bleeding issues noted.   Goal of Therapy:  INR goal 2.5-3 Monitor platelets by anticoagulation protocol: Yes   Plan:  Will give 6mg  of warfarin prior to discharge today then plan on resuming 5mg  daily going forward Daily INR.  Erin Hearing PharmD., BCPS Clinical Pharmacist 02/16/2022 8:54 AM

## 2022-02-17 ENCOUNTER — Inpatient Hospital Stay: Admission: AD | Admit: 2022-02-17 | Payer: Medicare Other | Source: Ambulatory Visit | Admitting: Internal Medicine

## 2022-02-17 ENCOUNTER — Ambulatory Visit (HOSPITAL_COMMUNITY)
Admission: RE | Admit: 2022-02-17 | Discharge: 2022-02-17 | Disposition: A | Discharge status: Home / Self Care | Payer: Medicare Other | Source: Ambulatory Visit | Attending: Internal Medicine | Admitting: Internal Medicine

## 2022-02-17 VITALS — BP 103/89 | HR 87

## 2022-02-17 DIAGNOSIS — I2510 Atherosclerotic heart disease of native coronary artery without angina pectoris: Secondary | ICD-10-CM | POA: Insufficient documentation

## 2022-02-17 DIAGNOSIS — I5082 Biventricular heart failure: Secondary | ICD-10-CM | POA: Diagnosis not present

## 2022-02-17 DIAGNOSIS — Z79899 Other long term (current) drug therapy: Secondary | ICD-10-CM | POA: Diagnosis not present

## 2022-02-17 DIAGNOSIS — Z7984 Long term (current) use of oral hypoglycemic drugs: Secondary | ICD-10-CM | POA: Diagnosis not present

## 2022-02-17 DIAGNOSIS — R42 Dizziness and giddiness: Secondary | ICD-10-CM | POA: Insufficient documentation

## 2022-02-17 DIAGNOSIS — R627 Adult failure to thrive: Secondary | ICD-10-CM

## 2022-02-17 DIAGNOSIS — Z7982 Long term (current) use of aspirin: Secondary | ICD-10-CM | POA: Insufficient documentation

## 2022-02-17 DIAGNOSIS — I50813 Acute on chronic right heart failure: Secondary | ICD-10-CM | POA: Diagnosis not present

## 2022-02-17 DIAGNOSIS — R55 Syncope and collapse: Secondary | ICD-10-CM

## 2022-02-17 DIAGNOSIS — R630 Anorexia: Secondary | ICD-10-CM | POA: Diagnosis not present

## 2022-02-17 DIAGNOSIS — I493 Ventricular premature depolarization: Secondary | ICD-10-CM | POA: Diagnosis not present

## 2022-02-17 DIAGNOSIS — R531 Weakness: Secondary | ICD-10-CM | POA: Diagnosis not present

## 2022-02-17 DIAGNOSIS — I5022 Chronic systolic (congestive) heart failure: Secondary | ICD-10-CM | POA: Insufficient documentation

## 2022-02-17 DIAGNOSIS — D649 Anemia, unspecified: Secondary | ICD-10-CM | POA: Diagnosis not present

## 2022-02-17 DIAGNOSIS — Z95811 Presence of heart assist device: Secondary | ICD-10-CM

## 2022-02-17 DIAGNOSIS — I951 Orthostatic hypotension: Secondary | ICD-10-CM | POA: Insufficient documentation

## 2022-02-17 DIAGNOSIS — I130 Hypertensive heart and chronic kidney disease with heart failure and stage 1 through stage 4 chronic kidney disease, or unspecified chronic kidney disease: Secondary | ICD-10-CM | POA: Diagnosis not present

## 2022-02-17 DIAGNOSIS — Z7901 Long term (current) use of anticoagulants: Secondary | ICD-10-CM | POA: Diagnosis not present

## 2022-02-17 DIAGNOSIS — Z95810 Presence of automatic (implantable) cardiac defibrillator: Secondary | ICD-10-CM | POA: Insufficient documentation

## 2022-02-17 DIAGNOSIS — N184 Chronic kidney disease, stage 4 (severe): Secondary | ICD-10-CM | POA: Diagnosis not present

## 2022-02-17 DIAGNOSIS — E039 Hypothyroidism, unspecified: Secondary | ICD-10-CM | POA: Insufficient documentation

## 2022-02-17 DIAGNOSIS — I4892 Unspecified atrial flutter: Secondary | ICD-10-CM | POA: Insufficient documentation

## 2022-02-17 LAB — COMPREHENSIVE METABOLIC PANEL
ALT: 100 U/L — ABNORMAL HIGH (ref 0–44)
AST: 25 U/L (ref 15–41)
Albumin: 3.5 g/dL (ref 3.5–5.0)
Alkaline Phosphatase: 104 U/L (ref 38–126)
Anion gap: 9 (ref 5–15)
BUN: 34 mg/dL — ABNORMAL HIGH (ref 8–23)
CO2: 26 mmol/L (ref 22–32)
Calcium: 9.2 mg/dL (ref 8.9–10.3)
Chloride: 100 mmol/L (ref 98–111)
Creatinine, Ser: 2.21 mg/dL — ABNORMAL HIGH (ref 0.61–1.24)
GFR, Estimated: 30 mL/min — ABNORMAL LOW (ref >60)
Glucose, Bld: 107 mg/dL — ABNORMAL HIGH (ref 70–99)
Potassium: 4 mmol/L (ref 3.5–5.1)
Sodium: 135 mmol/L (ref 135–145)
Total Bilirubin: 1.1 mg/dL (ref 0.3–1.2)
Total Protein: 6.8 g/dL (ref 6.5–8.1)

## 2022-02-17 LAB — CBC
HCT: 34.2 % — ABNORMAL LOW (ref 39.0–52.0)
Hemoglobin: 10.6 g/dL — ABNORMAL LOW (ref 13.0–17.0)
MCH: 29.9 pg (ref 26.0–34.0)
MCHC: 31 g/dL (ref 30.0–36.0)
MCV: 96.6 fL (ref 80.0–100.0)
Platelets: 176 10*3/uL (ref 150–400)
RBC: 3.54 MIL/uL — ABNORMAL LOW (ref 4.22–5.81)
RDW: 18.1 % — ABNORMAL HIGH (ref 11.5–15.5)
WBC: 4.2 10*3/uL (ref 4.0–10.5)
nRBC: 0 % (ref 0.0–0.2)

## 2022-02-17 LAB — PROTIME-INR
INR: 1.9 — ABNORMAL HIGH (ref 0.8–1.2)
Prothrombin Time: 21.7 seconds — ABNORMAL HIGH (ref 11.4–15.2)

## 2022-02-17 LAB — LACTATE DEHYDROGENASE: LDH: 197 U/L — ABNORMAL HIGH (ref 98–192)

## 2022-02-17 NOTE — Progress Notes (Signed)
Patient presents for sick visit in [facility] today with [name]. Reports no problems with VAD equipment or concerns with drive line.   Received call from pts wife stating the pt "fell out" while trying to get dressed this morning. Pt states that he felt very weak and lowered his head to the dresser to keep from hitting the floor. Pt was given hydralazine at 1125 yesterday in the hospital - wife did not give this last night. He received 8 mg of Cardura at 0933 yesterday morning - he did not receive last night.  Pt arrived to clinic using a cane today. Pt states that he no longer feels lightheaded and is unsure what brought on this episode. Pt tells me that he ate some crackers for breakfast but states he has been eating well.  Pt was d/c from the hospital yesterday afternoon.   Orthostatics performed in clinic today.  Vital Signs:   Doppler Pressure:  Automatc BP: 103/89 (96)  HR: 87  SPO2: 97% on RA   Weight: 161.4 lb w/ eqt Last weight: 162.2 lb w/eqt  Orthostatics: Lying: 110/98 (103) Speed: 9800 Flow: 4.9 Power: 6.2 w    PI: 5.2  Sitting: 108/96 (101) Speed: 9800 Flow: 5.2 Power: 6.3 w    PI: 5.2  Standing: 106/91 (98) Speed: 9800 Flow: 5.1 Power: 6.3 w    PI: 4.9  VAD Indication: Destination therapy- patient choice     VAD interrogation & Equipment Management: Speed: 9800 Flow: 5.2 Power: 6.3 w    PI: 5.2   Alarms: none Events: 10-25  Fixed speed 9800  Low speed limit: 9200   Primary Controller:  Replace back up battery in 28 months Back up controller:  Replace back up battery in 26 months   Annual Equipment Maintenance on UBC/PM was performed 09/23/21   I reviewed the LVAD parameters from today and compared the results to the patient's prior recorded data. LVAD interrogation was NEGATIVE for significant power changes, NEGATIVE for clinical alarms and STABLE for PI events/speed drops. No programming changes were made and pump is functioning within  specified parameters. Pt is performing daily controller and system monitor self tests along with completing weekly and monthly maintenance for LVAD equipment.  Exit Site Care: Drive line is being maintained weekly by [name], his wife. Existing VAD dressing C/D/I Sorbaview dressing and Silverlon patch intact. Exit site healed and incorporated, the velour is fully implanted at exit site. Pt denies fever or chills. Pt has adequate supplies at home.   Significant Events on VAD Support:  10/2013>SBO 12/2016> elevated LDH, admit for Bival   Device: Dual Medtronic Therapies:  on 231  Pacing: AAI<=>DDD 80 bpm  Last check:  06/26/21  BP & Labs:  Doppler  - Doppler is reflecting modified systolic  Hgb 85.4 - No S/S of bleeding. Specifically denies melena/BRBPR or nosebleeds.   LDH 197 - established baseline of 200- 350. Denies tea-colored urine. No power elevations noted on interrogation.    Patient Instructions:  Stop Hydralazine No Cardura tonight; tomorrow restart Cardura 4 mg twice a day Return to clinic on Thursday as previously schedule  Tanda Rockers RN [place] Coordinator  Office: [phone]  24/7 Pager: [phone]

## 2022-02-17 NOTE — Patient Instructions (Signed)
Stop Hydralazine No Cardura tonight; tomorrow restart Cardura 4 mg twice a day Return to clinic on Thursday as previously schedule

## 2022-02-17 NOTE — Progress Notes (Signed)
VAD CLINIC NOTE Patient presents for sick visit in [facility] today with his wife Johnny Oneill. Reports no problems with VAD equipment or concerns with drive line.    Received call from pts wife stating the pt "fell out" while trying to get dressed this morning. Pt states that he felt very weak and lowered his head to the dresser to keep from hitting the floor. Pt was given hydralazine at 1125 yesterday in the hospital - wife did not give this last night. He received 8 mg of Cardura at 0933 yesterday morning - he did not receive last night.   Pt arrived to clinic using a cane today. Pt states that he no longer feels lightheaded and is unsure what brought on this episode. Pt tells me that he ate some crackers for breakfast but states he has been eating well.   Pt was d/c from the hospital yesterday afternoon.    Orthostatics performed in clinic today.   Vital Signs:   Doppler Pressure:  Automatc BP: 103/89 (96)       HR: 87  SPO2: 97% on RA   Weight: 161.4 lb w/ eqt Last weight: 162.2 lb w/eqt   Orthostatics: Lying: 110/98 (103) Speed: 9800 Flow: 4.9 Power: 6.2 w    PI: 5.2   Sitting: 108/96 (101) Speed: 9800 Flow: 5.2 Power: 6.3 w    PI: 5.2   Standing: 106/91 (98) Speed: 9800 Flow: 5.1 Power: 6.3 w    PI: 4.9   VAD Indication: Destination therapy- patient choice     VAD interrogation & Equipment Management: Speed: 9800 Flow: 5.2 Power: 6.3 w    PI: 5.2   Alarms: none Events: 10-25  Fixed speed 9800  Low speed limit: 9200   Primary Controller:  Replace back up battery in 28 months Back up controller:  Replace back up battery in 26 months   Annual Equipment Maintenance on UBC/PM was performed 09/23/21   I reviewed the LVAD parameters from today and compared the results to the patient's prior recorded data. LVAD interrogation was NEGATIVE for significant power changes, NEGATIVE for clinical alarms and STABLE for PI events/speed drops. No programming changes were  made and pump is functioning within specified parameters. Pt is performing daily controller and system monitor self tests along with completing weekly and monthly maintenance for LVAD equipment.   Exit Site Care: Drive line is being maintained weekly by Johnny Oneill, his wife. Existing VAD dressing C/D/I Sorbaview dressing and Silverlon patch intact. Exit site healed and incorporated, the velour is fully implanted at exit site. Pt denies fever or chills. Pt has adequate supplies at home.    Significant Events on VAD Support:  10/2013>SBO 12/2016> elevated LDH, admit for Bival   Device: Dual Medtronic Therapies:  on 231  Pacing: AAI<=>DDD 80 bpm  Last check:  06/26/21   BP & Labs:  Doppler  - Doppler is reflecting modified systolic   Hgb 24.2 - No S/S of bleeding. Specifically denies melena/BRBPR or nosebleeds.   LDH 197 - established baseline of 200- 350. Denies tea-colored urine. No power elevations noted on interrogation.      Patient Instructions:  Stop Hydralazine No Cardura tonight; tomorrow restart Cardura 4 mg twice a day Return to clinic on Thursday as previously schedule   Johnny Rockers RN [name]  Office: [phone]  24/7 Pager: [phone]     HPI:  Johnny Oneill is a 81 y.o. 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   Presented to ER on 06/27/19 with syncopal episode and head injury from fall. ECG showed VT @ 240. Underwent emergent DC-CV in ER. Head CT no bleed. Admitted and had ICD gen replacement (EOL). -> Did not tolerate amio due to tremors.  Had ICD shock for VT on 06/09/20. Started on sotalol  On 02/06/21 Paced out of AFL.  Seen in ER 05/2021 due to LOW FLOW alarms. Found to be in VT rate 170. DCCV x 1 - returned to [place] with frequent PVCs. Pacer settings adjusted by [name] PA- DDD 90.  [place] 11/22 to assess for RV failure RA = 5 RV = 33/5 PA = 33/13 (20) PCW  = 10 Fick cardiac output/index = 4.0/2.1 Thermo CO/CI = 4.9/2.6 PVR = 2.0 WU Ao sat = 95% PA sat = 54%, 59% PAPI = 4.0  Admitted 12/01/21 for R TKA. Did well with surgery but complicated by severe orthostatic hypotension. Midodrine added    Admitted 12/18/21 with syncope. No events on device. Hydrated. Midodrine increased to 15 tid. Sotalol held  Admitted 12/28/21 with recurrent VT. Had DC-CV in ER. Sotalol restarted. Continued to have elevated MAPs as well as orthostasis. Midodrine decreased and florinef added.   Admitted 02/09/22 with  a/c RV failure w/ cardiogenic shock. LFTs, INR, creatinine elevated on admit. Started on empiric milrinone with gradual improvement. LFTs/INR/creatinine improved. Had [place] primary left sided failure. LVAD speed optimized with speed increased to 9800.  Milrinone stopped without difficulty. INR followed closely by pharmacy with adjustments to coumadin.  Orthostatic hypotension resolved so florinef was stopped. Maps running high and was started on cardura + hydralazine. Hgb dropped 7.9 without obvious source. Given unit of blood with appropriate rise. He remained on aspirin.After discharge his wife didn't give him hydralazine or cardura.   His wife called this morning and said he felt weak/dizziness about  30-45 minutes after taking cardura and hydralazine. She said his Map was 77. Overall feeling a weak. Denies SOB/PND/Orthopnea. No bleeding issues. Able to eat a good dinner last night. Appetite ok. No fever or chills. Taking all medications.   VAD Indication: Destination therapy- patient choice     VAD interrogation & Equipment Management: Speed: 9800 Flow: 4.3 Power: 5.8  PI: 6.3    Alarms: Events: rare  Fixed speed 9800  Low speed limit: 9200   Primary Controller:  Replace back up battery in 28 months Back up controller:  Replace back up battery in 26 months   Annual Equipment Maintenance on UBC/PM was performed 09/23/21   I reviewed the LVAD  parameters from today and compared the results to the patient's prior recorded data. LVAD interrogation was NEGATIVE for significant power changes, NEGATIVE for clinical alarms and STABLE for PI events/speed drops. No programming changes were made and pump is functioning within specified parameters. Pt is performing daily controller and system monitor self tests along with completing weekly and monthly maintenance for LVAD equipment.    Past Medical History:  Diagnosis Date   Anxiety    Automatic implantable cardioverter-defibrillator in situ    CAD (coronary artery disease)    S/P stenting of LCX in 2004;  s/p Lat MI 2009 with occlusion of the LCX - treated with Promus stenting. Has total occlusion of the RCA.    CHF (congestive heart failure) (HCC)    Hypercholesteremia    Hypertension    Hypothyroidism    ICD (implantable cardiac defibrillator) in  place    PROPHYLACTIC      medtronic   Inferior MI ([place]) "? date"; 2009   Ischemic cardiomyopathy    a. 10/09 Echo: Sev LV Dysfxn, inf/lat AK, Mod MR.   Liver disease    LVAD (left ventricular assist device) present ([place])    Osteoarthritis    a. s/p R TKR   Pacemaker    PVC (premature ventricular contraction)    RBBB    Shortness of breath     Current Outpatient Medications  Medication Sig Dispense Refill   acetaminophen (TYLENOL) 500 MG tablet Take 1,000 mg by mouth every 6 (six) hours as needed (for pain or headaches).     aspirin EC 81 MG tablet Take 81 mg by mouth in the morning.     Cholecalciferol (VITAMIN D3) 125 MCG (5000 UT) CAPS Take 5,000 Units by mouth daily with breakfast.     citalopram (CELEXA) 10 MG tablet Take 1 tablet by mouth once daily (Patient taking differently: Take 10 mg by mouth every morning.) 90 tablet 3   Coenzyme Q10 (COQ10) 200 MG CAPS Take 200 mg by mouth in the morning.     doxazosin (CARDURA) 4 MG tablet Take 1 tablet (4 mg total) by mouth every 12 (twelve) hours. 60 tablet 3   dronabinol (MARINOL) 2.5  MG capsule Take 1 capsule (2.5 mg total) by mouth daily before lunch. (Patient not taking: Reported on 02/09/2022) 30 capsule 5   Famotidine (PEPCID PO) Take 1 tablet by mouth every morning.     furosemide (LASIX) 20 MG tablet Take 1 tablet (20 mg total) by mouth daily as needed (Take 20 mg as daily as needed for wt > 153 lbs). Reported on 01/14/2016 (Patient taking differently: Take 20 mg by mouth daily as needed for fluid.) 30 tablet 6   melatonin 5 MG TABS Take 5 mg by mouth at bedtime as needed (for sleep).     mexiletine (MEXITIL) 150 MG capsule Take 1 capsule by mouth twice daily (Patient taking differently: Take 150 mg by mouth 2 (two) times daily.) 60 capsule 6   oxyCODONE (OXY IR/ROXICODONE) 5 MG immediate release tablet Take 1 tablet (5 mg total) by mouth every 12 (twelve) hours as needed for breakthrough pain. (Patient not taking: Reported on 02/09/2022) 30 tablet 0   pantoprazole (PROTONIX) 40 MG tablet Take 1 tablet by mouth once daily (Patient taking differently: Take 40 mg by mouth daily before breakfast.) 90 tablet 3   Probiotic Product (PROBIOTIC PO) Take 1 capsule by mouth in the morning.     rosuvastatin (CRESTOR) 20 MG tablet Take 1 tablet (20 mg total) by mouth daily. (Patient taking differently: Take 20 mg by mouth every morning.) 90 tablet 3   sotalol (BETAPACE) 80 MG tablet Take 1 tablet (80 mg total) by mouth daily. 30 tablet 1   SYNTHROID 175 MCG tablet 1 tablet daily every morning except half tablet on Sundays (Patient taking differently: Take 87.5-175 mcg by mouth See admin instructions. Take 87.5 mcg by mouth in the morning before breakfast on Sundays and 175 mcg on Mon/Tues/Wed/Thurs/Fri/Sat) 30 tablet 3   traMADol (ULTRAM) 50 MG tablet Take 1-2 tablets (50-100 mg total) by mouth every 6 (six) hours as needed for moderate pain. 40 tablet 5   traZODone (DESYREL) 50 MG tablet TAKE 1 TABLET BY MOUTH AT BEDTIME (Patient taking differently: Take 50 mg by mouth at bedtime.) 30 tablet  6   warfarin (COUMADIN) 5 MG tablet Take 5 mg (  1 tablet) every day as directed by HF Clinic 135 tablet 11   No current facility-administered medications for this encounter.   Facility-Administered Medications Ordered in Other Encounters  Medication Dose Route Frequency Provider Last Rate Last Admin   etomidate (AMIDATE) injection    Anesthesia Intra-op Myna Bright, CRNA   20 mg at 02/08/14 0915   rocuronium [facility]) injection    Anesthesia Intra-op Myna Bright, CRNA   50 mg at 02/08/14 0932   succinylcholine (ANECTINE) injection    Anesthesia Intra-op Myna Bright, CRNA   100 mg at 02/08/14 0915   Ace inhibitors, Amiodarone, Diovan [valsartan], Lipitor [atorvastatin calcium], Jardiance [empagliflozin], and Norvasc [amlodipine]  Review of systems complete and found to be negative unless listed in HPI.   Vitals:   02/17/22 1338  BP: 103/89  Pulse: 87  SpO2: 97%   Wt Readings from Last 3 Encounters:  02/16/22 69.1 kg (152 lb 5.4 oz)  02/09/22 73.2 kg (161 lb 6.4 oz)  01/28/22 73 kg (161 lb)   Vitals:   02/17/22 1338  BP: 103/89  Pulse: 87  SpO2: 97%   Wt Readings from Last 3 Encounters:  02/16/22 69.1 kg (152 lb 5.4 oz)  02/09/22 73.2 kg (161 lb 6.4 oz)  01/28/22 73 kg (161 lb)     Physical Exam: GENERAL: Appeared weak on arrival but improved after IV fluids.  HEENT: normal  NECK: Supple, JVP flat  .  2+ bilaterally, no bruits.  No lymphadenopathy or thyromegaly appreciated.   CARDIAC:  Mechanical heart sounds with LVAD hum present.  LUNGS:  Clear to auscultation bilaterally.  ABDOMEN:  Soft, round, nontender, positive bowel sounds x4.     LVAD exit site: well-healed and incorporated.  Dressing dry and intact.  No erythema or drainage.  Stabilization device present and accurately applied.  Driveline dressing is being changed daily per sterile technique. EXTREMITIES:  Warm and dry, no cyanosis, clubbing, rash or edema  NEUROLOGIC:  Alert and oriented x  4.  Gait steady.  No aphasia.  No dysarthria.  Affect pleasant.     ASSESSMENT AND PLAN:   1) Weakness/anorexia -Speed increased last week during RHC and on milrinone. Improved and gradually weaned off over the weekend.  - Today he had an episode of lightheadedness. This could be related to hypertension meds.   2) Chronic systolic HF: ICM, EF 12-87% s/p LVAD HM II implant 08/2013 for DT.   - Echo 3/18 EF 10-15% mild AI. Mod RV dysfunction. LVAD cannula OK - has struggled with RV failure however RHC 11/22 looked good (despite evidence of RVF on LE dopplers showing reflux - Now NYHA IV. He had difficulty bouncing back. Placed on milrinone and speed increased.  - I wound if presyncope today is related to taking cardura and hydralazine at the same time. His reported Map 82 during that time.  - Given 250 cc LR with improvement.  - Today we will stop hydralazine. Hold cardura tonight and restart tomorrow morning will space out to every 12 hours.   - Failed Jardiance due to volume depletion - He has not tolerated ACE-I or amlodipine or b-blocker in the past. - Considered admit with initiation of milrinone but  as above will stop hydralazine and see how he tolerates. If he continues to feel poorly will need to admit and start milrinone. Would like to avoid given risk of infection and arrhythmias.   3) LVAD: HMII 08/2013 for DT. - VAD interrogated personally. Parameters stable. -  Admitted in May 2018 with elevated LDH with bival.  - Continue aspirin and coumadin.  - LDH 197  - INR 1.9 today.  - DL site ok - As above stop hydralazine and hold tonights dose of cardura. Restart cardura tomorrow.   4) Orthostatic hypotension, post-op - He is not orthostatic today.   5) VT/Frequent PVCs - now s/p ICD gen change.  - Off amio due to cerebellar side effects.  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 - VT  06/16/21 with  DC-CV x 1 - Recurrent VT 12/28/21 after stopping sotalol. Sotalol restarted. QT ok.  - PVCs  improved on mexilitene. Continue mexilitene 150 bid - Keep K > 4.0 Mg > 2.0 - Continue to f/u with EP. - No VT today.   6) Atrial flutter, recurrent - patient paced out of AFL in 6/22. Atrial therapies enabled  - continue sotalol (on for VT). QTs slightly prolonged but stable - In SR today.   7) Chronic anticoagulation: INR goal 2.0-3.0.    - INR 1.9 today.  - Continue ASA 81 mg for LVAD + warfarin.   8) HTN:  - See above. If hydralazine add   9) Hypothyroidism:  - Followed by Dr Dwyane Dee. Stable.No change   10) Hyperlipidemia:  - Continue Crestor. Zetia stopped due to cost.  - No change.   11) CAD - no s/s ischemia - continue Crestor  12) CKD IV - Creatinine baseline 1.7-2.0 -Today creatinine 2.21 . Needs to increase po intake.  - failed Jardiance - Has Nephrology referral  13) Anemia  - Hgb 10.6 today   See VAD coordinator note. Follow up Thursday to reassess. Lab values look ok. LFTs improved from discharge. Hgb and LDH stable.INR. 1.9.   Amy Clegg NP-C  3:13 PM  Patient seen and examined with the above-signed Advanced Practice Provider and/or Housestaff. I personally reviewed laboratory data, imaging studies and relevant notes. I independently examined the patient and formulated the important aspects of the plan. I have edited the note to reflect any of my changes or salient points. I have personally discussed the plan with the patient and/or family.  81 y/o VAD patient as above. Recently admitted with low output HF requiring milrinone. Had [place]. Felt to be mainly due to high afterload with marked HTN. LVAD speed increased and anti-HTN regimen titrated. Treated with milrinone briefly.   Had been doing well. This am had pre-syncopal episode after taking cardura 4 and hydral 25 at the same time. Initial SBP 82.   Came to clinic and got 250 NS. VAD parameters, labs and orthostatics looked good.   General:  NAD.  HEENT: normal  Neck: supple. JVP not elevated.  Carotids  2+ bilat; no bruits. No lymphadenopathy or thryomegaly appreciated. Cor: LVAD hum.  Lungs: Clear. Abdomen: soft, nontender, non-distended. No hepatosplenomegaly. No bruits or masses. Good bowel sounds. Driveline site clean. Anchor in place.  Extremities: no cyanosis, clubbing, rash. Warm no edema  Neuro: alert & oriented x 3. No focal deficits. Moves all 4 without problem   Long discussion about options of admitting for milrinone initiation (if we can find access) versus a trial of cutting back anti-HTN regimen. Will try the latter for now. Low threshold to admit for milrinone, as needed.   Total time spent 45 minutes. Over half that time spent discussing above.   Glori Bickers, MD  6:30 PM

## 2022-02-19 ENCOUNTER — Ambulatory Visit (HOSPITAL_COMMUNITY)
Admit: 2022-02-19 | Discharge: 2022-02-19 | Disposition: A | Discharge status: Home / Self Care | Payer: Medicare Other | Attending: Internal Medicine | Admitting: Internal Medicine

## 2022-02-19 ENCOUNTER — Encounter (HOSPITAL_COMMUNITY): Payer: Self-pay

## 2022-02-19 VITALS — BP 106/88 | HR 95

## 2022-02-19 DIAGNOSIS — Z955 Presence of coronary angioplasty implant and graft: Secondary | ICD-10-CM | POA: Insufficient documentation

## 2022-02-19 DIAGNOSIS — Z95811 Presence of heart assist device: Secondary | ICD-10-CM

## 2022-02-19 NOTE — Progress Notes (Signed)
Patient presents for [facility] check in [facility] today with [name]. Reports no problems with VAD equipment or concerns with drive line.   Pt walked in to clinic unassisted. States he is feeling much better. Denies lightheadedness, dizziness, falls, shortness of breath, and signs of bleeding. Reports improved appetite. Taking Cardura 4 mg 12 hours apart. Stopped Hydralazine as ordered.   Vital Signs:   Doppler Pressure: 110 Automatc BP: 106/88 (95)  HR: 95  SPO2: UTO% on RA   Patient Instructions:  Continue Cardura 4 mg twice a day Return to clinic on 02/24/22 for previously scheduled appt  Emerson Monte RN [place] Coordinator  Office: [phone]  24/7 Pager: [phone]

## 2022-02-20 ENCOUNTER — Encounter: Payer: Self-pay | Admitting: Student

## 2022-02-20 ENCOUNTER — Ambulatory Visit (INDEPENDENT_AMBULATORY_CARE_PROVIDER_SITE_OTHER): Payer: Medicare Other | Admitting: Student

## 2022-02-20 VITALS — BP 104/0 | HR 92 | Ht 67.0 in | Wt 164.0 lb

## 2022-02-20 DIAGNOSIS — I255 Ischemic cardiomyopathy: Secondary | ICD-10-CM | POA: Diagnosis not present

## 2022-02-20 DIAGNOSIS — Z95811 Presence of heart assist device: Secondary | ICD-10-CM

## 2022-02-20 DIAGNOSIS — I472 Ventricular tachycardia, unspecified: Secondary | ICD-10-CM | POA: Diagnosis not present

## 2022-02-20 DIAGNOSIS — I493 Ventricular premature depolarization: Secondary | ICD-10-CM | POA: Diagnosis not present

## 2022-02-20 DIAGNOSIS — I5022 Chronic systolic (congestive) heart failure: Secondary | ICD-10-CM | POA: Diagnosis not present

## 2022-02-20 LAB — CUP PACEART INCLINIC DEVICE CHECK
Battery Remaining Longevity: 61 mo
Battery Voltage: 2.97 V
Brady Statistic AP VP Percent: 86.43 %
Brady Statistic AP VS Percent: 9.35 %
Brady Statistic AS VP Percent: 4.11 %
Brady Statistic AS VS Percent: 0.11 %
Brady Statistic RA Percent Paced: 95.53 %
Brady Statistic RV Percent Paced: 89.93 %
Date Time Interrogation Session: 20230623110156
HighPow Impedance: 38 Ohm
HighPow Impedance: 48 Ohm
Implantable Lead Implant Date: 20091103
Implantable Lead Implant Date: 20091103
Implantable Lead Location: 753859
Implantable Lead Location: 753860
Implantable Lead Model: 5076
Implantable Lead Model: 6947
Implantable Pulse Generator Implant Date: 20201028
Lead Channel Impedance Value: 304 Ohm
Lead Channel Impedance Value: 399 Ohm
Lead Channel Impedance Value: 399 Ohm
Lead Channel Pacing Threshold Amplitude: 0.5 V
Lead Channel Pacing Threshold Amplitude: 1.375 V
Lead Channel Pacing Threshold Pulse Width: 0.4 ms
Lead Channel Pacing Threshold Pulse Width: 0.4 ms
Lead Channel Sensing Intrinsic Amplitude: 1.5 mV
Lead Channel Sensing Intrinsic Amplitude: 1.625 mV
Lead Channel Sensing Intrinsic Amplitude: 3.75 mV
Lead Channel Sensing Intrinsic Amplitude: 6.375 mV
Lead Channel Setting Pacing Amplitude: 1.5 V
Lead Channel Setting Pacing Amplitude: 2.75 V
Lead Channel Setting Pacing Pulse Width: 0.6 ms
Lead Channel Setting Sensing Sensitivity: 0.3 mV

## 2022-02-21 ENCOUNTER — Telehealth (HOSPITAL_COMMUNITY): Payer: Self-pay | Admitting: *Deleted

## 2022-02-21 DIAGNOSIS — Z95811 Presence of heart assist device: Secondary | ICD-10-CM

## 2022-02-21 DIAGNOSIS — I5022 Chronic systolic (congestive) heart failure: Secondary | ICD-10-CM

## 2022-02-21 MED ORDER — DOXAZOSIN MESYLATE 4 MG PO TABS: 2.0000 mg | ORAL_TABLET | Freq: Two times a day (BID) | ORAL | 3 refills | Status: DC

## 2022-02-22 ENCOUNTER — Telehealth (HOSPITAL_COMMUNITY): Payer: Self-pay | Admitting: *Deleted

## 2022-02-22 DIAGNOSIS — I130 Hypertensive heart and chronic kidney disease with heart failure and stage 1 through stage 4 chronic kidney disease, or unspecified chronic kidney disease: Secondary | ICD-10-CM | POA: Diagnosis not present

## 2022-02-22 DIAGNOSIS — N184 Chronic kidney disease, stage 4 (severe): Secondary | ICD-10-CM | POA: Diagnosis not present

## 2022-02-22 DIAGNOSIS — Z471 Aftercare following joint replacement surgery: Secondary | ICD-10-CM | POA: Diagnosis not present

## 2022-02-22 DIAGNOSIS — I951 Orthostatic hypotension: Secondary | ICD-10-CM | POA: Diagnosis not present

## 2022-02-22 DIAGNOSIS — D631 Anemia in chronic kidney disease: Secondary | ICD-10-CM | POA: Diagnosis not present

## 2022-02-22 DIAGNOSIS — I5022 Chronic systolic (congestive) heart failure: Secondary | ICD-10-CM | POA: Diagnosis not present

## 2022-02-23 ENCOUNTER — Ambulatory Visit (HOSPITAL_COMMUNITY)
Admission: RE | Admit: 2022-02-23 | Discharge: 2022-02-23 | Disposition: A | Discharge status: Home / Self Care | Payer: Medicare Other | Source: Ambulatory Visit | Attending: Cardiology | Admitting: Cardiology

## 2022-02-23 ENCOUNTER — Encounter (HOSPITAL_COMMUNITY): Payer: Self-pay

## 2022-02-23 ENCOUNTER — Ambulatory Visit (HOSPITAL_COMMUNITY): Payer: Self-pay | Admitting: Pharmacist

## 2022-02-23 ENCOUNTER — Other Ambulatory Visit (HOSPITAL_COMMUNITY): Payer: Self-pay

## 2022-02-23 ENCOUNTER — Other Ambulatory Visit (HOSPITAL_COMMUNITY): Payer: Self-pay | Admitting: *Deleted

## 2022-02-23 VITALS — BP 116/0

## 2022-02-23 DIAGNOSIS — I5082 Biventricular heart failure: Secondary | ICD-10-CM | POA: Insufficient documentation

## 2022-02-23 DIAGNOSIS — N184 Chronic kidney disease, stage 4 (severe): Secondary | ICD-10-CM | POA: Insufficient documentation

## 2022-02-23 DIAGNOSIS — R42 Dizziness and giddiness: Secondary | ICD-10-CM | POA: Diagnosis not present

## 2022-02-23 DIAGNOSIS — E785 Hyperlipidemia, unspecified: Secondary | ICD-10-CM | POA: Insufficient documentation

## 2022-02-23 DIAGNOSIS — I4892 Unspecified atrial flutter: Secondary | ICD-10-CM | POA: Diagnosis not present

## 2022-02-23 DIAGNOSIS — I159 Secondary hypertension, unspecified: Secondary | ICD-10-CM | POA: Diagnosis not present

## 2022-02-23 DIAGNOSIS — Z95811 Presence of heart assist device: Secondary | ICD-10-CM | POA: Diagnosis not present

## 2022-02-23 DIAGNOSIS — D649 Anemia, unspecified: Secondary | ICD-10-CM | POA: Insufficient documentation

## 2022-02-23 DIAGNOSIS — Z7901 Long term (current) use of anticoagulants: Secondary | ICD-10-CM

## 2022-02-23 DIAGNOSIS — I951 Orthostatic hypotension: Secondary | ICD-10-CM | POA: Diagnosis not present

## 2022-02-23 DIAGNOSIS — Z79899 Other long term (current) drug therapy: Secondary | ICD-10-CM | POA: Insufficient documentation

## 2022-02-23 DIAGNOSIS — E039 Hypothyroidism, unspecified: Secondary | ICD-10-CM | POA: Insufficient documentation

## 2022-02-23 DIAGNOSIS — R55 Syncope and collapse: Secondary | ICD-10-CM | POA: Diagnosis not present

## 2022-02-23 DIAGNOSIS — I493 Ventricular premature depolarization: Secondary | ICD-10-CM | POA: Diagnosis not present

## 2022-02-23 DIAGNOSIS — Z95810 Presence of automatic (implantable) cardiac defibrillator: Secondary | ICD-10-CM | POA: Diagnosis not present

## 2022-02-23 DIAGNOSIS — Z7982 Long term (current) use of aspirin: Secondary | ICD-10-CM | POA: Diagnosis not present

## 2022-02-23 DIAGNOSIS — I2510 Atherosclerotic heart disease of native coronary artery without angina pectoris: Secondary | ICD-10-CM | POA: Insufficient documentation

## 2022-02-23 DIAGNOSIS — I472 Ventricular tachycardia, unspecified: Secondary | ICD-10-CM | POA: Diagnosis not present

## 2022-02-23 DIAGNOSIS — I5022 Chronic systolic (congestive) heart failure: Secondary | ICD-10-CM | POA: Diagnosis not present

## 2022-02-23 DIAGNOSIS — I130 Hypertensive heart and chronic kidney disease with heart failure and stage 1 through stage 4 chronic kidney disease, or unspecified chronic kidney disease: Secondary | ICD-10-CM | POA: Insufficient documentation

## 2022-02-23 LAB — CBC
HCT: 34.8 % — ABNORMAL LOW (ref 39.0–52.0)
Hemoglobin: 11 g/dL — ABNORMAL LOW (ref 13.0–17.0)
MCH: 30.5 pg (ref 26.0–34.0)
MCHC: 31.6 g/dL (ref 30.0–36.0)
MCV: 96.4 fL (ref 80.0–100.0)
Platelets: 173 10*3/uL (ref 150–400)
RBC: 3.61 MIL/uL — ABNORMAL LOW (ref 4.22–5.81)
RDW: 17.2 % — ABNORMAL HIGH (ref 11.5–15.5)
WBC: 4.4 10*3/uL (ref 4.0–10.5)
nRBC: 0 % (ref 0.0–0.2)

## 2022-02-23 LAB — COMPREHENSIVE METABOLIC PANEL
ALT: 36 U/L (ref 0–44)
AST: 21 U/L (ref 15–41)
Albumin: 3.6 g/dL (ref 3.5–5.0)
Alkaline Phosphatase: 90 U/L (ref 38–126)
Anion gap: 7 (ref 5–15)
BUN: 22 mg/dL (ref 8–23)
CO2: 25 mmol/L (ref 22–32)
Calcium: 9.1 mg/dL (ref 8.9–10.3)
Chloride: 107 mmol/L (ref 98–111)
Creatinine, Ser: 2.2 mg/dL — ABNORMAL HIGH (ref 0.61–1.24)
GFR, Estimated: 30 mL/min — ABNORMAL LOW (ref >60)
Glucose, Bld: 156 mg/dL — ABNORMAL HIGH (ref 70–99)
Potassium: 4.1 mmol/L (ref 3.5–5.1)
Sodium: 139 mmol/L (ref 135–145)
Total Bilirubin: 0.8 mg/dL (ref 0.3–1.2)
Total Protein: 6.8 g/dL (ref 6.5–8.1)

## 2022-02-23 LAB — PROTIME-INR
INR: 1.9 — ABNORMAL HIGH (ref 0.8–1.2)
Prothrombin Time: 21.9 seconds — ABNORMAL HIGH (ref 11.4–15.2)

## 2022-02-23 LAB — LACTATE DEHYDROGENASE: LDH: 225 U/L — ABNORMAL HIGH (ref 98–192)

## 2022-02-23 LAB — MAGNESIUM: Magnesium: 2.1 mg/dL (ref 1.7–2.4)

## 2022-02-23 MED ORDER — DOXAZOSIN MESYLATE 2 MG PO TABS: 1.0000 mg | ORAL_TABLET | Freq: Two times a day (BID) | ORAL | 5 refills | Status: DC

## 2022-02-23 MED ORDER — DOXAZOSIN MESYLATE 1 MG PO TABS: 1.0000 mg | ORAL_TABLET | Freq: Two times a day (BID) | ORAL | 5 refills | Status: DC

## 2022-02-23 MED ORDER — MIDODRINE HCL 5 MG PO TABS: 5.0000 mg | ORAL_TABLET | Freq: Two times a day (BID) | ORAL | 5 refills | Status: DC | PRN

## 2022-02-23 NOTE — Progress Notes (Signed)
VAD CLINIC NOTE    HPI:  Johnny Oneill is a 81 y.o. 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   Presented to ER on 06/27/19 with syncopal episode and head injury from fall. ECG showed VT @ 240. Underwent emergent DC-CV in ER. Head CT no bleed. Admitted and had ICD gen replacement (EOL). -> Did not tolerate amio due to tremors.  Had ICD shock for VT on 06/09/20. Started on sotalol  On 02/06/21 Paced out of AFL.  Seen in ER 05/2021 due to LOW FLOW alarms. Found to be in VT rate 170. DCCV x 1 - returned to SR with frequent PVCs. Pacer settings adjusted by Otilio Saber PA- DDD 90.  RHC 11/22 to assess for RV failure RA = 5 RV = 33/5 PA = 33/13 (20) PCW = 10 Fick cardiac output/index = 4.0/2.1 Thermo CO/CI = 4.9/2.6 PVR = 2.0 WU Ao sat = 95% PA sat = 54%, 59% PAPI = 4.0  Admitted 12/01/21 for R TKA. Did well with surgery but complicated by severe orthostatic hypotension. Midodrine added    Admitted 12/18/21 with syncope. No events on device. Hydrated. Midodrine increased to 15 tid. Sotalol held  Admitted 12/28/21 with recurrent VT. Had DC-CV in ER. Sotalol restarted. Continued to have elevated MAPs as well as orthostasis. Midodrine decreased and florinef added.   Admitted 02/09/22 with  a/c RV failure w/ cardiogenic shock. LFTs, INR, creatinine elevated on admit. Started on empiric milrinone with gradual improvement. LFTs/INR/creatinine improved. Had RHC primary left sided failure. LVAD speed optimized with speed increased to 9800.  Milrinone stopped without difficulty. INR followed closely by pharmacy with adjustments to coumadin.  Orthostatic hypotension resolved so florinef was stopped. Maps running high and was started on cardura + hydralazine. Hgb dropped 7.9 without obvious source. Given unit of blood with appropriate rise. He remained on aspirin.After discharge his wife didn't give him  hydralazine or cardura.   Seen last week in clinic with low BP. Hydralazine stopped and cardura decreased to 4mg  bid.   Received a few pages over the weekend due to patient having lightheaded episodes. See separate notes for documentation. On Saturday held evening dose of Cardura 4 mg, and resumed decrease dose of 2 mg BID of Sunday morning. Paged on Sunday afternoon with continued lightheadedness with doppler 65. Cardura stopped and given midodrine 5mg  x 1    Here for f/u visit with his wife. Feels much better now that MAPs are up. Weight down another 8 lbs. Reports appetite has improved, and he has not needed Marinol. Denies orthopnea or PND. No fevers, chills or problems with driveline. No bleeding, melena or neuro symptoms. No VAD alarms. Taking all meds as prescribed.    VAD Indication: Destination therapy- patient choice     VAD interrogation & Equipment Management: Speed: 9800 Flow: 3.8 Power: 5.5 w    PI: 6.1   Alarms: none Events: 50 - 60 per day  Fixed speed 9800  Low speed limit: 9200   Primary Controller:  Replace back up battery in 28 months Back up controller:  Replace back up battery in 26 months   Annual Equipment Maintenance on UBC/PM was performed 09/23/21   I reviewed the LVAD parameters from today and compared the results to the patient's prior recorded data. LVAD interrogation was NEGATIVE for significant power changes, NEGATIVE for clinical alarms and STABLE for PI events/speed drops. No  programming changes were made and pump is functioning within specified parameters. Pt is performing daily controller and system monitor self tests along with completing weekly and monthly maintenance for LVAD equipment.    Past Medical History:  Diagnosis Date   Anxiety    Automatic implantable cardioverter-defibrillator in situ    CAD (coronary artery disease)    S/P stenting of LCX in 2004;  s/p Lat MI 2009 with occlusion of the LCX - treated with Promus stenting. Has total  occlusion of the RCA.    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 10/09 Echo: Sev LV Dysfxn, inf/lat AK, Mod MR.   Liver disease    LVAD (left ventricular assist device) present (HCC)    Osteoarthritis    a. s/p R TKR   Pacemaker    PVC (premature ventricular contraction)    RBBB    Shortness of breath     Current Outpatient Medications  Medication Sig Dispense Refill   acetaminophen (TYLENOL) 500 MG tablet Take 1,000 mg by mouth every 6 (six) hours as needed (for pain or headaches).     aspirin EC 81 MG tablet Take 81 mg by mouth in the morning.     Cholecalciferol (VITAMIN D3) 125 MCG (5000 UT) CAPS Take 5,000 Units by mouth daily with breakfast.     citalopram (CELEXA) 10 MG tablet Take 1 tablet by mouth once daily (Patient taking differently: Take 10 mg by mouth every morning.) 90 tablet 3   Coenzyme Q10 (COQ10) 200 MG CAPS Take 200 mg by mouth in the morning.     doxazosin (CARDURA) 2 MG tablet Take 0.5 tablets (1 mg total) by mouth 2 (two) times daily. Take 0.5 tablet (1 mg total) by mouth 2 (two) times daily if Doppler BP 120 or greater. Check BP in the morning and evening. 30 tablet 5   Famotidine (PEPCID PO) Take 1 tablet by mouth every morning.     melatonin 5 MG TABS Take 5 mg by mouth at bedtime as needed (for sleep).     mexiletine (MEXITIL) 150 MG capsule Take 1 capsule by mouth twice daily (Patient taking differently: Take 150 mg by mouth 2 (two) times daily.) 60 capsule 6   midodrine (PROAMATINE) 5 MG tablet Take 1 tablet (5 mg total) by mouth 2 (two) times daily as needed. Take 5 mg if doppler is 90 or less. 60 tablet 5   pantoprazole (PROTONIX) 40 MG tablet Take 1 tablet by mouth once daily (Patient taking differently: Take 40 mg by mouth daily before breakfast.) 90  tablet 3   Probiotic Product (PROBIOTIC PO) Take 1 capsule by mouth in the morning.     rosuvastatin (CRESTOR) 20 MG tablet Take 1 tablet (20 mg total) by mouth daily. (Patient taking differently: Take 20 mg by mouth every morning.) 90 tablet 3   sotalol (BETAPACE) 80 MG tablet Take 1 tablet (80 mg total) by mouth daily. 30 tablet 1   SYNTHROID 175 MCG tablet 1 tablet daily every morning except half tablet on Sundays (Patient taking differently: Take 87.5-175 mcg by mouth See admin instructions. Take 87.5 mcg by mouth in the morning before breakfast on Sundays and 175 mcg on Mon/Tues/Wed/Thurs/Fri/Sat) 30 tablet 3  traZODone (DESYREL) 50 MG tablet TAKE 1 TABLET BY MOUTH AT BEDTIME (Patient taking differently: Take 50 mg by mouth at bedtime.) 30 tablet 6   warfarin (COUMADIN) 5 MG tablet Take 5 mg (1 tablet) every day as directed by [facility] Clinic 135 tablet 11   dronabinol (MARINOL) 2.5 MG capsule Take 1 capsule (2.5 mg total) by mouth daily before lunch. (Patient not taking: Reported on 02/23/2022) 30 capsule 5   furosemide (LASIX) 20 MG tablet Take 1 tablet (20 mg total) by mouth daily as needed (Take 20 mg as daily as needed for wt > 153 lbs). Reported on 01/14/2016 (Patient not taking: Reported on 02/23/2022) 30 tablet 6   oxyCODONE (OXY IR/ROXICODONE) 5 MG immediate release tablet Take 1 tablet (5 mg total) by mouth every 12 (twelve) hours as needed for breakthrough pain. (Patient not taking: Reported on 02/23/2022) 30 tablet 0   traMADol (ULTRAM) 50 MG tablet Take 1-2 tablets (50-100 mg total) by mouth every 6 (six) hours as needed for moderate pain. (Patient not taking: Reported on 02/23/2022) 40 tablet 5   No current facility-administered medications for this encounter.   Facility-Administered Medications Ordered in Other Encounters  Medication Dose Route Frequency Provider Last Rate Last Admin   etomidate (AMIDATE) injection    Anesthesia Intra-op Lucinda Dell, CRNA   20 mg at 02/08/14 0915    rocuronium [facility]) injection    Anesthesia Intra-op Lucinda Dell, CRNA   50 mg at 02/08/14 0932   succinylcholine (ANECTINE) injection    Anesthesia Intra-op Lucinda Dell, CRNA   100 mg at 02/08/14 0915   Ace inhibitors, Amiodarone, Diovan [valsartan], Lipitor [atorvastatin calcium], Jardiance [empagliflozin], and Norvasc [amlodipine]  Review of systems complete and found to be negative unless listed in HPI.     Vitals:   02/23/22 1110 02/23/22 1119  BP: (!) 116/0   SpO2:  97%   Wt Readings from Last 3 Encounters:  02/20/22 74.4 kg (164 lb)  02/16/22 69.1 kg (152 lb 5.4 oz)  02/09/22 73.2 kg (161 lb 6.4 oz)   Vital Signs:   Doppler Pressure: 116 Automatc BP: see below HR: 85   SPO2: 100% on RA   Weight: 153.8 lb w/ eqt Last weight: 161.4 lb w/eqt   Orthostatics: Lying: 126/96 (107) HR 85 Speed: 9800 Flow: 4.0 Power: 5.5 w    PI: 6.2   Sitting: 118/77 (102)  HR 84  Speed: 9800 Flow: 4.3 Power: 5.8 w    PI: 6.9   Standing: 121/78 (98)  HR 83 Speed: 9800 Flow: 4.5 Power: 6.1 w    PI: 5.4   Physical Exam: General:  NAD.  HEENT: normal  Neck: supple. JVP not elevated.  Carotids 2+ bilat; no bruits. No lymphadenopathy or thryomegaly appreciated. Cor: LVAD hum.  Lungs: Clear. Abdomen: soft, nontender, non-distended. No hepatosplenomegaly. No bruits or masses. Good bowel sounds. Driveline site clean. Anchor in place.  Extremities: no cyanosis, clubbing, rash. Warm no edema  Neuro: alert & oriented x 3. No focal deficits. Moves all 4 without problem       ASSESSMENT AND PLAN:   1) HTN complicated by orthostatic hypotension - he has been very symptomatic. Unable to find sweet spot between high BP with high pump afterload and symptomatic low BP  - He will monitor BP two times per day. Take Cardura 1 mg BID if Doppler 120 or greater. If Doppler is 90 or less take Midodrine 5 mg. Check BP in the morning and  evening.  - If fails this appraoch may need to  go back on milrinone though IV access is an issues   2) Chronic systolic HF: ICM, EF 20-25% s/p LVAD HM II implant 08/2013 for DT.   - Echo 3/18 EF 10-15% mild AI. Mod RV dysfunction. LVAD cannula OK - has struggled with RV failure however RHC 11/22 looked good (despite evidence of RVF on LE dopplers showing reflux - Now NYHA IIIb- IV. - Volume status ok - Plan as above  -Failed Jardiance due to volume depletion - He has not tolerated ACE-I or amlodipine or b-blocker in the past.  3) LVAD: HMII 08/2013 for DT. - VAD interrogated personally. Parameters stable. - Continue aspirin and coumadin.  - LDH 225  - INR 1.9 today. Discussed dosing with PharmD personally. - DL site ok - As above stop hydralazine and hold tonights dose of cardura. Restart cardura tomorrow.    4) VT/Frequent PVCs - now s/p ICD gen change.  - Off amio due to cerebellar side effects.  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 - VT  06/16/21 with  DC-CV x 1 - Recurrent VT 12/28/21 after stopping sotalol. Sotalol restarted. QT ok.  - PVCs improved on mexilitene. Continue mexilitene 150 bid - Keep K > 4.0 Mg > 2.0 - Continue to f/u with EP. - Continue sotalol and mexilitene   5) Atrial flutter, recurrent - patient paced out of AFL in 6/22. Atrial therapies enabled  - continue sotalol (on for VT). QTs slightly prolonged but stable - In SR today. No change  6) Chronic anticoagulation: INR goal 2.0-3.0.    - INR 1.9 Discussed dosing with PharmD personally. - Continue ASA 81 mg for LVAD + warfarin.   7) Hypothyroidism:  - Followed by Dr Lucianne Muss. Stable.No change   8) Hyperlipidemia:  - Continue Crestor. Zetia stopped due to cost.  - No change.   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 Over half that time spent discussing above.   Arvilla Meres, MD  4:36 PM

## 2022-02-24 ENCOUNTER — Encounter (HOSPITAL_COMMUNITY): Payer: Medicare Other

## 2022-02-24 ENCOUNTER — Other Ambulatory Visit: Payer: Self-pay

## 2022-02-24 DIAGNOSIS — E890 Postprocedural hypothyroidism: Secondary | ICD-10-CM

## 2022-02-24 MED ORDER — LEVOTHYROXINE SODIUM 175 MCG PO TABS: ORAL_TABLET | ORAL | 0 refills | Status: DC

## 2022-02-26 ENCOUNTER — Telehealth (HOSPITAL_COMMUNITY): Payer: Self-pay | Admitting: Unknown Physician Specialty

## 2022-02-26 NOTE — Telephone Encounter (Signed)
Received call from pts wife stating that since starting the new BP regimen [name] is doing well. She states that they are checking his BP 2-3x a day and that he has only been lightheaded 1-2x in the past 3 days. Wife was instructed to call VAD office if pt starts to have dizzy spells or BP is lower than expected.  Tanda Rockers RN, BSN VAD Coordinator 24/7 Pager [phone]

## 2022-02-27 DIAGNOSIS — I130 Hypertensive heart and chronic kidney disease with heart failure and stage 1 through stage 4 chronic kidney disease, or unspecified chronic kidney disease: Secondary | ICD-10-CM | POA: Diagnosis not present

## 2022-02-27 DIAGNOSIS — N184 Chronic kidney disease, stage 4 (severe): Secondary | ICD-10-CM | POA: Diagnosis not present

## 2022-02-27 DIAGNOSIS — Z471 Aftercare following joint replacement surgery: Secondary | ICD-10-CM | POA: Diagnosis not present

## 2022-02-27 DIAGNOSIS — I5022 Chronic systolic (congestive) heart failure: Secondary | ICD-10-CM | POA: Diagnosis not present

## 2022-02-27 DIAGNOSIS — I951 Orthostatic hypotension: Secondary | ICD-10-CM | POA: Diagnosis not present

## 2022-02-27 DIAGNOSIS — D631 Anemia in chronic kidney disease: Secondary | ICD-10-CM | POA: Diagnosis not present

## 2022-02-28 DIAGNOSIS — I130 Hypertensive heart and chronic kidney disease with heart failure and stage 1 through stage 4 chronic kidney disease, or unspecified chronic kidney disease: Secondary | ICD-10-CM | POA: Diagnosis not present

## 2022-02-28 DIAGNOSIS — N184 Chronic kidney disease, stage 4 (severe): Secondary | ICD-10-CM | POA: Diagnosis not present

## 2022-02-28 DIAGNOSIS — I951 Orthostatic hypotension: Secondary | ICD-10-CM | POA: Diagnosis not present

## 2022-02-28 DIAGNOSIS — I5022 Chronic systolic (congestive) heart failure: Secondary | ICD-10-CM | POA: Diagnosis not present

## 2022-02-28 DIAGNOSIS — Z471 Aftercare following joint replacement surgery: Secondary | ICD-10-CM | POA: Diagnosis not present

## 2022-02-28 DIAGNOSIS — D631 Anemia in chronic kidney disease: Secondary | ICD-10-CM | POA: Diagnosis not present

## 2022-03-02 DIAGNOSIS — D631 Anemia in chronic kidney disease: Secondary | ICD-10-CM | POA: Diagnosis not present

## 2022-03-02 DIAGNOSIS — N184 Chronic kidney disease, stage 4 (severe): Secondary | ICD-10-CM | POA: Diagnosis not present

## 2022-03-02 DIAGNOSIS — I130 Hypertensive heart and chronic kidney disease with heart failure and stage 1 through stage 4 chronic kidney disease, or unspecified chronic kidney disease: Secondary | ICD-10-CM | POA: Diagnosis not present

## 2022-03-02 DIAGNOSIS — Z471 Aftercare following joint replacement surgery: Secondary | ICD-10-CM | POA: Diagnosis not present

## 2022-03-02 DIAGNOSIS — I5022 Chronic systolic (congestive) heart failure: Secondary | ICD-10-CM | POA: Diagnosis not present

## 2022-03-02 DIAGNOSIS — I951 Orthostatic hypotension: Secondary | ICD-10-CM | POA: Diagnosis not present

## 2022-03-04 DIAGNOSIS — N184 Chronic kidney disease, stage 4 (severe): Secondary | ICD-10-CM | POA: Diagnosis not present

## 2022-03-04 DIAGNOSIS — Z7952 Long term (current) use of systemic steroids: Secondary | ICD-10-CM | POA: Diagnosis not present

## 2022-03-04 DIAGNOSIS — I4892 Unspecified atrial flutter: Secondary | ICD-10-CM | POA: Diagnosis not present

## 2022-03-04 DIAGNOSIS — F419 Anxiety disorder, unspecified: Secondary | ICD-10-CM | POA: Diagnosis not present

## 2022-03-04 DIAGNOSIS — K769 Liver disease, unspecified: Secondary | ICD-10-CM | POA: Diagnosis not present

## 2022-03-04 DIAGNOSIS — Z96651 Presence of right artificial knee joint: Secondary | ICD-10-CM | POA: Diagnosis not present

## 2022-03-04 DIAGNOSIS — I2510 Atherosclerotic heart disease of native coronary artery without angina pectoris: Secondary | ICD-10-CM | POA: Diagnosis not present

## 2022-03-04 DIAGNOSIS — I5022 Chronic systolic (congestive) heart failure: Secondary | ICD-10-CM | POA: Diagnosis not present

## 2022-03-04 DIAGNOSIS — I252 Old myocardial infarction: Secondary | ICD-10-CM | POA: Diagnosis not present

## 2022-03-04 DIAGNOSIS — E039 Hypothyroidism, unspecified: Secondary | ICD-10-CM | POA: Diagnosis not present

## 2022-03-04 DIAGNOSIS — R32 Unspecified urinary incontinence: Secondary | ICD-10-CM | POA: Diagnosis not present

## 2022-03-04 DIAGNOSIS — E7800 Pure hypercholesterolemia, unspecified: Secondary | ICD-10-CM | POA: Diagnosis not present

## 2022-03-04 DIAGNOSIS — Z7982 Long term (current) use of aspirin: Secondary | ICD-10-CM | POA: Diagnosis not present

## 2022-03-04 DIAGNOSIS — I255 Ischemic cardiomyopathy: Secondary | ICD-10-CM | POA: Diagnosis not present

## 2022-03-04 DIAGNOSIS — Z7901 Long term (current) use of anticoagulants: Secondary | ICD-10-CM | POA: Diagnosis not present

## 2022-03-04 DIAGNOSIS — I130 Hypertensive heart and chronic kidney disease with heart failure and stage 1 through stage 4 chronic kidney disease, or unspecified chronic kidney disease: Secondary | ICD-10-CM | POA: Diagnosis not present

## 2022-03-04 DIAGNOSIS — I4510 Unspecified right bundle-branch block: Secondary | ICD-10-CM | POA: Diagnosis not present

## 2022-03-04 DIAGNOSIS — D631 Anemia in chronic kidney disease: Secondary | ICD-10-CM | POA: Diagnosis not present

## 2022-03-04 DIAGNOSIS — K4090 Unilateral inguinal hernia, without obstruction or gangrene, not specified as recurrent: Secondary | ICD-10-CM | POA: Diagnosis not present

## 2022-03-04 DIAGNOSIS — I428 Other cardiomyopathies: Secondary | ICD-10-CM | POA: Diagnosis not present

## 2022-03-04 DIAGNOSIS — M79675 Pain in left toe(s): Secondary | ICD-10-CM | POA: Diagnosis not present

## 2022-03-04 DIAGNOSIS — I951 Orthostatic hypotension: Secondary | ICD-10-CM | POA: Diagnosis not present

## 2022-03-04 DIAGNOSIS — M109 Gout, unspecified: Secondary | ICD-10-CM | POA: Diagnosis not present

## 2022-03-04 DIAGNOSIS — I472 Ventricular tachycardia, unspecified: Secondary | ICD-10-CM | POA: Diagnosis not present

## 2022-03-04 DIAGNOSIS — I493 Ventricular premature depolarization: Secondary | ICD-10-CM | POA: Diagnosis not present

## 2022-03-09 ENCOUNTER — Other Ambulatory Visit (HOSPITAL_COMMUNITY): Payer: Self-pay | Admitting: *Deleted

## 2022-03-09 DIAGNOSIS — Z95811 Presence of heart assist device: Secondary | ICD-10-CM

## 2022-03-09 DIAGNOSIS — I472 Ventricular tachycardia, unspecified: Secondary | ICD-10-CM | POA: Diagnosis not present

## 2022-03-09 DIAGNOSIS — I951 Orthostatic hypotension: Secondary | ICD-10-CM | POA: Diagnosis not present

## 2022-03-09 DIAGNOSIS — I5022 Chronic systolic (congestive) heart failure: Secondary | ICD-10-CM

## 2022-03-09 DIAGNOSIS — I130 Hypertensive heart and chronic kidney disease with heart failure and stage 1 through stage 4 chronic kidney disease, or unspecified chronic kidney disease: Secondary | ICD-10-CM | POA: Diagnosis not present

## 2022-03-09 DIAGNOSIS — D631 Anemia in chronic kidney disease: Secondary | ICD-10-CM | POA: Diagnosis not present

## 2022-03-09 DIAGNOSIS — N184 Chronic kidney disease, stage 4 (severe): Secondary | ICD-10-CM | POA: Diagnosis not present

## 2022-03-09 DIAGNOSIS — Z7901 Long term (current) use of anticoagulants: Secondary | ICD-10-CM

## 2022-03-11 ENCOUNTER — Encounter (HOSPITAL_COMMUNITY): Payer: Self-pay

## 2022-03-11 ENCOUNTER — Ambulatory Visit (HOSPITAL_COMMUNITY): Payer: Self-pay | Admitting: Pharmacist

## 2022-03-11 ENCOUNTER — Ambulatory Visit (HOSPITAL_COMMUNITY)
Admission: RE | Admit: 2022-03-11 | Discharge: 2022-03-11 | Disposition: A | Discharge status: Home / Self Care | Payer: Medicare Other | Source: Ambulatory Visit | Attending: Internal Medicine | Admitting: Internal Medicine

## 2022-03-11 VITALS — BP 132/99 | HR 82 | Wt 153.8 lb

## 2022-03-11 DIAGNOSIS — I5022 Chronic systolic (congestive) heart failure: Secondary | ICD-10-CM | POA: Diagnosis not present

## 2022-03-11 DIAGNOSIS — Z95811 Presence of heart assist device: Secondary | ICD-10-CM | POA: Diagnosis not present

## 2022-03-11 DIAGNOSIS — Z7901 Long term (current) use of anticoagulants: Secondary | ICD-10-CM

## 2022-03-11 DIAGNOSIS — I130 Hypertensive heart and chronic kidney disease with heart failure and stage 1 through stage 4 chronic kidney disease, or unspecified chronic kidney disease: Secondary | ICD-10-CM | POA: Diagnosis not present

## 2022-03-11 DIAGNOSIS — N184 Chronic kidney disease, stage 4 (severe): Secondary | ICD-10-CM | POA: Diagnosis not present

## 2022-03-11 DIAGNOSIS — R55 Syncope and collapse: Secondary | ICD-10-CM | POA: Diagnosis not present

## 2022-03-11 DIAGNOSIS — R42 Dizziness and giddiness: Secondary | ICD-10-CM

## 2022-03-11 DIAGNOSIS — D631 Anemia in chronic kidney disease: Secondary | ICD-10-CM | POA: Diagnosis not present

## 2022-03-11 DIAGNOSIS — I472 Ventricular tachycardia, unspecified: Secondary | ICD-10-CM | POA: Diagnosis not present

## 2022-03-11 DIAGNOSIS — I951 Orthostatic hypotension: Secondary | ICD-10-CM | POA: Diagnosis not present

## 2022-03-11 DIAGNOSIS — I2510 Atherosclerotic heart disease of native coronary artery without angina pectoris: Secondary | ICD-10-CM | POA: Diagnosis not present

## 2022-03-11 LAB — COMPREHENSIVE METABOLIC PANEL
ALT: 13 U/L (ref 0–44)
AST: 20 U/L (ref 15–41)
Albumin: 3.4 g/dL — ABNORMAL LOW (ref 3.5–5.0)
Alkaline Phosphatase: 96 U/L (ref 38–126)
Anion gap: 7 (ref 5–15)
BUN: 30 mg/dL — ABNORMAL HIGH (ref 8–23)
CO2: 24 mmol/L (ref 22–32)
Calcium: 9.2 mg/dL (ref 8.9–10.3)
Chloride: 107 mmol/L (ref 98–111)
Creatinine, Ser: 2.42 mg/dL — ABNORMAL HIGH (ref 0.61–1.24)
GFR, Estimated: 27 mL/min — ABNORMAL LOW (ref >60)
Glucose, Bld: 78 mg/dL (ref 70–99)
Potassium: 4.1 mmol/L (ref 3.5–5.1)
Sodium: 138 mmol/L (ref 135–145)
Total Bilirubin: 0.7 mg/dL (ref 0.3–1.2)
Total Protein: 6.7 g/dL (ref 6.5–8.1)

## 2022-03-11 LAB — CBC
HCT: 33.6 % — ABNORMAL LOW (ref 39.0–52.0)
Hemoglobin: 10.8 g/dL — ABNORMAL LOW (ref 13.0–17.0)
MCH: 29.6 pg (ref 26.0–34.0)
MCHC: 32.1 g/dL (ref 30.0–36.0)
MCV: 92.1 fL (ref 80.0–100.0)
Platelets: 183 10*3/uL (ref 150–400)
RBC: 3.65 MIL/uL — ABNORMAL LOW (ref 4.22–5.81)
RDW: 15.8 % — ABNORMAL HIGH (ref 11.5–15.5)
WBC: 5.5 10*3/uL (ref 4.0–10.5)
nRBC: 0 % (ref 0.0–0.2)

## 2022-03-11 LAB — PROTIME-INR
INR: 3.2 — ABNORMAL HIGH (ref 0.8–1.2)
Prothrombin Time: 32.6 seconds — ABNORMAL HIGH (ref 11.4–15.2)

## 2022-03-11 LAB — LACTATE DEHYDROGENASE: LDH: 234 U/L — ABNORMAL HIGH (ref 98–192)

## 2022-03-11 LAB — PREALBUMIN: Prealbumin: 16 mg/dL — ABNORMAL LOW (ref 18–38)

## 2022-03-11 LAB — MAGNESIUM: Magnesium: 1.9 mg/dL (ref 1.7–2.4)

## 2022-03-11 NOTE — Patient Instructions (Addendum)
No medication changes today Coumadin dosing per Lauren PharmD Return to clinic in 2 month for follow up with Dr Haroldine Laws

## 2022-03-11 NOTE — Progress Notes (Addendum)
Patient presents for 2 week f/u with 8.5 year Intermacs in [facility] today with [name]. Reports no problems with VAD equipment or concerns with drive line.   Pt ambulated into clinic unassisted. Looks and feels better. Denies lightheadedness, dizziness, falls, heart failure symptoms, and signs of bleeding. Reports occasional lightheadedness if he is out in the heat and has not had enough to drink.   Weight has maintained today. Reports appetite has improved, and he has not needed Marinol. Per Dr Haroldine Laws he may continue liberalized diet and may increase salt intake.   Per Dr Haroldine Laws pt to take Cardura 1 mg BID if Doppler 120 or greater. If Doppler is 90 or less take Midodrine 5 mg. Check BP in the morning and evening. Pt has only taken 1 dose of Cardura, and "a few" doses of Midodrine since last visit. Doppler mostly 100-105 at home.   Vital Signs:   Doppler Pressure: 130 Automatc BP: 132/99 (113) HR: 85   SPO2: 100% on RA   Weight: 153.8 lb w/ eqt Last weight: 153.8 lb w/eqt   VAD Indication: Destination therapy- patient choice     VAD interrogation & Equipment Management: Speed: 9800 Flow: 4.2 Power: 5.7 w    PI: 6.8   Alarms: none Events: 15 - 45 per day; 7/6 60+  Fixed speed 9800  Low speed limit: 9200   Primary Controller:  Replace back up battery in 27 months Back up controller:  Replace back up battery in 26 months-- not present today   Annual Equipment Maintenance on UBC/PM was performed 09/23/21   I reviewed the LVAD parameters from today and compared the results to the patient's prior recorded data. LVAD interrogation was NEGATIVE for significant power changes, NEGATIVE for clinical alarms and POSITIVE for PI events/speed drops. No programming changes were made and pump is functioning within specified parameters. Pt is performing daily controller and system monitor self tests along with completing weekly and monthly maintenance for LVAD equipment.  Exit  Site Care: Drive line is being maintained weekly by [name], his wife. Existing VAD dressing C/D/I Sorbaview dressing and Silverlon patch intact. Exit site healed and incorporated, the velour is fully implanted at exit site. Pt denies fever or chills. Pt provided with 4 weekly kits for home use.   Significant Events on VAD Support:  10/2013>SBO 12/2016> elevated LDH, admit for [place]   Device: Dual Medtronic Therapies:  on 231  Pacing: AAI<=>DDD 80 bpm  Last check:  06/26/21  BP & Labs:  Doppler 130 - Doppler is reflecting modified systolic  Hgb 09.6- No S/S of bleeding. Specifically denies melena/BRBPR or nosebleeds.   LDH 234 - established baseline of 200- 350. Denies tea-colored urine. No power elevations noted on interrogation.   8.5 year Intermacs follow up completed including:  Quality of Life, KCCQ-12, and Neurocognitive trail making.   Unable to complete 6 MW due to recent right knee replacement, and recovering from failure to thrive symptoms.   Back up controller: Not present today  Patient Goals: "To stay alive." He would like his health to continue to improve from recent failure to thrive/RV failure after right knee replacement. He wants to continue to eat better as to not lose anymore weight. Wt maintained today.   [place] Cardiomyopathy Questionnaire     03/11/2022    2:26 PM 09/23/2021    3:12 PM 03/20/2021    1:48 PM  KCCQ-12  1 a. Ability to shower/bathe Slightly limited Slightly limited Slightly limited  1 b. Ability to walk 1 block Slightly limited Moderately limited Slightly limited  1 c. Ability to hurry/jog Quite a bit limited Quite a bit limited Other, Did not do  2. Edema feet/ankles/legs Less than once a week Less than once a week Never over the past 2 weeks  3. Limited by fatigue Less than once a week 1-2 times a week Less than once a week  4. Limited by dyspnea 1-2 times a week 1-2 times a week Less than once a week  5. Sitting up / on 3+ pillows Never  over the past 2 weeks Never over the past 2 weeks Never over the past 2 weeks  6. Limited enjoyment of life Slightly limited Not limited at all Not limited at all  7. Rest of life w/ symptoms Completely satisfied Completely satisfied Completely satisfied  8 a. Participation in hobbies Did not limit at all Slightly limited Slightly limited  8 b. Participation in chores Slightly limited Slightly limited Did not limit at all  8 c. Visiting family/friends Did not limit at all Did not limit at all Did not limit at all     Patient Instructions:  No medication changes today Coumadin dosing per Lauren PharmD Return to clinic in 2 month for follow up with Dr Haroldine Laws  Emerson Monte RN [place] Coordinator  Office: [phone]  24/7 Pager: [phone]

## 2022-03-11 NOTE — Progress Notes (Signed)
LVAD INR 

## 2022-03-12 NOTE — Progress Notes (Signed)
VAD CLINIC NOTE    HPI:  Johnny Oneill is a 81 y.o. 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   Presented to ER on 06/27/19 with syncopal episode and head injury from fall. ECG showed VT @ 240. Underwent emergent DC-CV in ER. Head CT no bleed. Admitted and had ICD gen replacement (EOL). -> Did not tolerate amio due to tremors.  Had ICD shock for VT on 06/09/20. Started on sotalol  On 02/06/21 Paced out of AFL.  Seen in ER 05/2021 due to LOW FLOW alarms. Found to be in VT rate 170. DCCV x 1 - returned to [place] with frequent PVCs. Pacer settings adjusted by [name] PA- DDD 90.  [place] 11/22 to assess for RV failure RA = 5 RV = 33/5 PA = 33/13 (20) PCW = 10 Fick cardiac output/index = 4.0/2.1 Thermo CO/CI = 4.9/2.6 PVR = 2.0 WU Ao sat = 95% PA sat = 54%, 59% PAPI = 4.0  Admitted 12/01/21 for R TKA. Did well with surgery but complicated by severe orthostatic hypotension. Midodrine added    Admitted 12/18/21 with syncope. No events on device. Hydrated. Midodrine increased to 15 tid. Sotalol held  Admitted 12/28/21 with recurrent VT. Had DC-CV in ER. Sotalol restarted. Continued to have elevated MAPs as well as orthostasis. Midodrine decreased and florinef added.   Admitted 02/09/22 with  a/c RV failure w/ cardiogenic shock. LFTs, INR, creatinine elevated on admit. Started on empiric milrinone with gradual improvement. LFTs/INR/creatinine improved. Had [place] primary left sided failure. LVAD speed optimized with speed increased to 9800.  Milrinone stopped without difficulty. INR followed closely by pharmacy with adjustments to coumadin.  Orthostatic hypotension resolved so florinef was stopped. Maps running high and was started on cardura + hydralazine. Hgb dropped 7.9 without obvious source. Given unit of blood with appropriate rise. He remained on aspirin.After discharge his wife didn't give him  hydralazine or cardura.   Seen last week in clinic with low BP. Hydralazine stopped and cardura decreased to 4mg  bid.   Received a few pages over the weekend due to patient having lightheaded episodes. See separate notes for documentation. On Saturday held evening dose of Cardura 4 mg, and resumed decrease dose of 2 mg BID of Sunday morning. Paged on Sunday afternoon with continued lightheadedness with doppler 65. Cardura stopped and given midodrine 5mg  x 1    Here for f/u visit with his wife. MAPS remain labile. Has been following the plan we discussed to take Cardura 1 mg BID if Doppler 120 or greater. If Doppler is 90 or less take Midodrine 5 mg. Check BP in the morning and evening. He says he has only taken 1 dose of Cardura, and "a few" doses of Midodrine since last visit. Doppler mostly 100-105 at home. Feels much better. More active. Knee is doing well. Denies orthopnea or PND. No fevers, chills or problems with driveline. No bleeding, melena or neuro symptoms. No VAD alarms. Taking all meds as prescribed.     VAD Indication: Destination therapy- patient choice     VAD interrogation & Equipment Management: Speed: 9800 Flow: 4.2 Power: 5.7 w    PI: 6.8   Alarms: none Events: 15 - 45 per day; 7/6 60+  Fixed speed 9800  Low speed limit: 9200   Primary Controller:  Replace back up battery in 27 months Back up controller:  Replace back up battery  in 26 months-- not present today   Annual Equipment Maintenance on UBC/PM was performed 09/23/21   I reviewed the LVAD parameters from today and compared the results to the patient's prior recorded data. LVAD interrogation was NEGATIVE for significant power changes, NEGATIVE for clinical alarms and POSITIVE for PI events/speed drops. No programming changes were made and pump is functioning within specified parameters. Pt is performing daily controller and system monitor self tests along with completing weekly and monthly maintenance for LVAD  equipment.    Past Medical History:  Diagnosis Date   Anxiety    Automatic implantable cardioverter-defibrillator in situ    CAD (coronary artery disease)    S/P stenting of LCX in 2004;  s/p Lat MI 2009 with occlusion of the LCX - treated with Promus stenting. Has total occlusion of the RCA.    CHF (congestive heart failure) (HCC)    Hypercholesteremia    Hypertension    Hypothyroidism    ICD (implantable cardiac defibrillator) in place    PROPHYLACTIC      medtronic   Inferior MI ([place]) "? date"; 2009   Ischemic cardiomyopathy    a. 10/09 Echo: Sev LV Dysfxn, inf/lat AK, Mod MR.   Liver disease    LVAD (left ventricular assist device) present ([place])    Osteoarthritis    a. s/p R TKR   Pacemaker    PVC (premature ventricular contraction)    RBBB    Shortness of breath     Current Outpatient Medications  Medication Sig Dispense Refill   acetaminophen (TYLENOL) 500 MG tablet Take 1,000 mg by mouth every 6 (six) hours as needed (for pain or headaches).     aspirin EC 81 MG tablet Take 81 mg by mouth in the morning.     Cholecalciferol (VITAMIN D3) 125 MCG (5000 UT) CAPS Take 5,000 Units by mouth daily with breakfast.     citalopram (CELEXA) 10 MG tablet Take 1 tablet by mouth once daily (Patient taking differently: Take 10 mg by mouth every morning.) 90 tablet 3   Coenzyme Q10 (COQ10) 200 MG CAPS Take 200 mg by mouth in the morning.     doxazosin (CARDURA) 2 MG tablet Take 0.5 tablets (1 mg total) by mouth 2 (two) times daily. Take 0.5 tablet (1 mg total) by mouth 2 (two) times daily if Doppler BP 120 or greater. Check BP in the morning and evening. 30 tablet 5   Famotidine (PEPCID PO) Take 1 tablet by mouth every morning.     levothyroxine (SYNTHROID) 175 MCG tablet 1 tablet daily every morning except half tablet on Sundays 90 tablet 0   melatonin 5 MG TABS Take 5 mg by mouth at bedtime as needed (for sleep).     mexiletine (MEXITIL) 150 MG capsule Take 1 capsule by mouth twice  daily (Patient taking differently: Take 150 mg by mouth 2 (two) times daily.) 60 capsule 6   midodrine (PROAMATINE) 5 MG tablet Take 1 tablet (5 mg total) by mouth 2 (two) times daily as needed. Take 5 mg if doppler is 90 or less. 60 tablet 5   pantoprazole (PROTONIX) 40 MG tablet Take 1 tablet by mouth once daily (Patient taking differently: Take 40 mg by mouth daily before breakfast.) 90 tablet 3   Probiotic Product (PROBIOTIC PO) Take 1 capsule by mouth in the morning.     rosuvastatin (CRESTOR) 20 MG tablet Take 1 tablet (20 mg total) by mouth daily. (Patient taking differently: Take 20 mg by  mouth every morning.) 90 tablet 3   sotalol (BETAPACE) 80 MG tablet Take 1 tablet (80 mg total) by mouth daily. 30 tablet 1   traZODone (DESYREL) 50 MG tablet TAKE 1 TABLET BY MOUTH AT BEDTIME (Patient taking differently: Take 50 mg by mouth at bedtime.) 30 tablet 6   warfarin (COUMADIN) 5 MG tablet Take 5 mg (1 tablet) every day as directed by [facility] Clinic 135 tablet 11   dronabinol (MARINOL) 2.5 MG capsule Take 1 capsule (2.5 mg total) by mouth daily before lunch. (Patient not taking: Reported on 02/23/2022) 30 capsule 5   furosemide (LASIX) 20 MG tablet Take 1 tablet (20 mg total) by mouth daily as needed (Take 20 mg as daily as needed for wt > 153 lbs). Reported on 01/14/2016 (Patient not taking: Reported on 02/23/2022) 30 tablet 6   oxyCODONE (OXY IR/ROXICODONE) 5 MG immediate release tablet Take 1 tablet (5 mg total) by mouth every 12 (twelve) hours as needed for breakthrough pain. (Patient not taking: Reported on 02/23/2022) 30 tablet 0   traMADol (ULTRAM) 50 MG tablet Take 1-2 tablets (50-100 mg total) by mouth every 6 (six) hours as needed for moderate pain. (Patient not taking: Reported on 02/23/2022) 40 tablet 5   No current facility-administered medications for this encounter.   Facility-Administered Medications Ordered in Other Encounters  Medication Dose Route Frequency Provider Last Rate Last Admin    etomidate (AMIDATE) injection    Anesthesia Intra-op Myna Bright, CRNA   20 mg at 02/08/14 0915   rocuronium [facility]) injection    Anesthesia Intra-op Myna Bright, CRNA   50 mg at 02/08/14 0932   succinylcholine (ANECTINE) injection    Anesthesia Intra-op Myna Bright, CRNA   100 mg at 02/08/14 0915   Ace inhibitors, Amiodarone, Diovan [valsartan], Lipitor [atorvastatin calcium], Jardiance [empagliflozin], and Norvasc [amlodipine]  Review of systems complete and found to be negative unless listed in HPI.     Vitals:   03/11/22 0918 03/11/22 0919  BP: (!) 130/0 (!) 132/99  Pulse: 82   SpO2: 99%    Wt Readings from Last 3 Encounters:  03/11/22 69.8 kg (153 lb 12.8 oz)  02/20/22 74.4 kg (164 lb)  02/16/22 69.1 kg (152 lb 5.4 oz)     Vital Signs:   Doppler Pressure: 130 Automatc BP: 132/99 (113) HR: 85   SPO2: 100% on RA   Weight: 153.8 lb w/ eqt Last weight: 153.8 lb w/eqt     Physical Exam: General:  NAD.  HEENT: normal  Neck: supple. JVP not elevated.  Carotids 2+ bilat; no bruits. No lymphadenopathy or thryomegaly appreciated. Cor: LVAD hum.  Lungs: Clear. Abdomen: soft, nontender, non-distended. No hepatosplenomegaly. No bruits or masses. Good bowel sounds. Driveline site clean. Anchor in place.  Extremities: no cyanosis, clubbing, rash. Warm no edema  Neuro: alert & oriented x 3. No focal deficits. Moves all 4 without problem        ASSESSMENT AND PLAN:   1) HTN complicated by orthostatic hypotension - he has been very symptomatic. Unable to find sweet spot between high BP with high pump afterload and symptomatic low BP  - MAPs remain labile. Doing well with current plan.  Take Cardura 1 mg BID if Doppler 120 or greater. If Doppler is 90 or less take Midodrine 5 mg. Check BP in the morning and evening.  - If fails this appraoch may need to go back on milrinone though IV access is an issues   2)  Chronic systolic HF: ICM, EF 60-10% s/p LVAD HM  II implant 08/2013 for DT.   - Echo 3/18 EF 10-15% mild AI. Mod RV dysfunction. LVAD cannula OK - has struggled with RV failure however RHC 11/22 looked good (despite evidence of RVF on LE dopplers showing reflux - Improved. Back to baseline NYHA II-III - Volume status ok - Continue plan as above  -Failed Jardiance due to volume depletion - He has not tolerated ACE-I or amlodipine or b-blocker in the past.  3) LVAD: HMII 08/2013 for DT. - VAD interrogated personally. Parameters stable. - Continue aspirin and coumadin.  - LDH 234 - INR 3.2 today. Discussed dosing with PharmD personally. - DL site looks good - MAP management as above   4) VT/Frequent PVCs - now s/p ICD gen change.  - Off amio due to cerebellar side effects.  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 - VT  06/16/21 with  DC-CV x 1 - Recurrent VT 12/28/21 after stopping sotalol. Sotalol restarted. QT ok.  - PVCs improved on mexilitene. Continue mexilitene 150 bid - Keep K > 4.0 Mg > 2.0 - Continue to f/u with EP. - Continue sotalol and mexilitene  - VT quiescent. -> no change   5) Atrial flutter, recurrent - patient paced out of AFL in 6/22. Atrial therapies enabled  - continue sotalol (on for VT). QTs slightly prolonged but stable - In SR today on monitor. No change  6) Chronic anticoagulation: INR goal 2.0-3.0.    - INR 3.2 today. Discussed dosing with PharmD personally. - Continue ASA 81 mg for LVAD + warfarin.   7) Hypothyroidism:  - Followed by Dr Dwyane Dee. Stable.No change   8) Hyperlipidemia:  - Continue Crestor. Zetia stopped due to cost.  - No change.   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 Over half that time spent discussing above.   Glori Bickers, MD  12:20 PM

## 2022-03-13 DIAGNOSIS — I130 Hypertensive heart and chronic kidney disease with heart failure and stage 1 through stage 4 chronic kidney disease, or unspecified chronic kidney disease: Secondary | ICD-10-CM | POA: Diagnosis not present

## 2022-03-13 DIAGNOSIS — N184 Chronic kidney disease, stage 4 (severe): Secondary | ICD-10-CM | POA: Diagnosis not present

## 2022-03-13 DIAGNOSIS — I472 Ventricular tachycardia, unspecified: Secondary | ICD-10-CM | POA: Diagnosis not present

## 2022-03-13 DIAGNOSIS — I951 Orthostatic hypotension: Secondary | ICD-10-CM | POA: Diagnosis not present

## 2022-03-13 DIAGNOSIS — I5022 Chronic systolic (congestive) heart failure: Secondary | ICD-10-CM | POA: Diagnosis not present

## 2022-03-13 DIAGNOSIS — D631 Anemia in chronic kidney disease: Secondary | ICD-10-CM | POA: Diagnosis not present

## 2022-03-16 DIAGNOSIS — I130 Hypertensive heart and chronic kidney disease with heart failure and stage 1 through stage 4 chronic kidney disease, or unspecified chronic kidney disease: Secondary | ICD-10-CM | POA: Diagnosis not present

## 2022-03-16 DIAGNOSIS — I472 Ventricular tachycardia, unspecified: Secondary | ICD-10-CM | POA: Diagnosis not present

## 2022-03-16 DIAGNOSIS — I5022 Chronic systolic (congestive) heart failure: Secondary | ICD-10-CM | POA: Diagnosis not present

## 2022-03-16 DIAGNOSIS — I951 Orthostatic hypotension: Secondary | ICD-10-CM | POA: Diagnosis not present

## 2022-03-16 DIAGNOSIS — D631 Anemia in chronic kidney disease: Secondary | ICD-10-CM | POA: Diagnosis not present

## 2022-03-16 DIAGNOSIS — N184 Chronic kidney disease, stage 4 (severe): Secondary | ICD-10-CM | POA: Diagnosis not present

## 2022-03-17 ENCOUNTER — Telehealth (HOSPITAL_COMMUNITY): Payer: Self-pay | Admitting: *Deleted

## 2022-03-17 DIAGNOSIS — R52 Pain, unspecified: Secondary | ICD-10-CM

## 2022-03-17 MED ORDER — PREDNISONE 20 MG PO TABS: 40.0000 mg | ORAL_TABLET | Freq: Every day | ORAL | 0 refills | Status: AC

## 2022-03-17 NOTE — Telephone Encounter (Signed)
Wife called to report patient is having gout like symptoms in his finger, "similar to the gout pain he had in his foot" during recent hospitalization. Darrick Grinder, NP updated - Rx sent to local pharmacy for prednisone taper. Wife updated.  Zada Girt RN, [place] Coordinator [phone]

## 2022-03-18 DIAGNOSIS — D631 Anemia in chronic kidney disease: Secondary | ICD-10-CM | POA: Diagnosis not present

## 2022-03-18 DIAGNOSIS — I130 Hypertensive heart and chronic kidney disease with heart failure and stage 1 through stage 4 chronic kidney disease, or unspecified chronic kidney disease: Secondary | ICD-10-CM | POA: Diagnosis not present

## 2022-03-18 DIAGNOSIS — I951 Orthostatic hypotension: Secondary | ICD-10-CM | POA: Diagnosis not present

## 2022-03-18 DIAGNOSIS — N184 Chronic kidney disease, stage 4 (severe): Secondary | ICD-10-CM | POA: Diagnosis not present

## 2022-03-18 DIAGNOSIS — I5022 Chronic systolic (congestive) heart failure: Secondary | ICD-10-CM | POA: Diagnosis not present

## 2022-03-18 DIAGNOSIS — I472 Ventricular tachycardia, unspecified: Secondary | ICD-10-CM | POA: Diagnosis not present

## 2022-03-20 DIAGNOSIS — I951 Orthostatic hypotension: Secondary | ICD-10-CM | POA: Diagnosis not present

## 2022-03-20 DIAGNOSIS — I5022 Chronic systolic (congestive) heart failure: Secondary | ICD-10-CM | POA: Diagnosis not present

## 2022-03-20 DIAGNOSIS — I472 Ventricular tachycardia, unspecified: Secondary | ICD-10-CM | POA: Diagnosis not present

## 2022-03-20 DIAGNOSIS — N184 Chronic kidney disease, stage 4 (severe): Secondary | ICD-10-CM | POA: Diagnosis not present

## 2022-03-20 DIAGNOSIS — I130 Hypertensive heart and chronic kidney disease with heart failure and stage 1 through stage 4 chronic kidney disease, or unspecified chronic kidney disease: Secondary | ICD-10-CM | POA: Diagnosis not present

## 2022-03-20 DIAGNOSIS — D631 Anemia in chronic kidney disease: Secondary | ICD-10-CM | POA: Diagnosis not present

## 2022-03-23 DIAGNOSIS — I951 Orthostatic hypotension: Secondary | ICD-10-CM | POA: Diagnosis not present

## 2022-03-23 DIAGNOSIS — I472 Ventricular tachycardia, unspecified: Secondary | ICD-10-CM | POA: Diagnosis not present

## 2022-03-23 DIAGNOSIS — N184 Chronic kidney disease, stage 4 (severe): Secondary | ICD-10-CM | POA: Diagnosis not present

## 2022-03-23 DIAGNOSIS — D631 Anemia in chronic kidney disease: Secondary | ICD-10-CM | POA: Diagnosis not present

## 2022-03-23 DIAGNOSIS — I130 Hypertensive heart and chronic kidney disease with heart failure and stage 1 through stage 4 chronic kidney disease, or unspecified chronic kidney disease: Secondary | ICD-10-CM | POA: Diagnosis not present

## 2022-03-23 DIAGNOSIS — I5022 Chronic systolic (congestive) heart failure: Secondary | ICD-10-CM | POA: Diagnosis not present

## 2022-03-24 DIAGNOSIS — N184 Chronic kidney disease, stage 4 (severe): Secondary | ICD-10-CM | POA: Diagnosis not present

## 2022-03-24 DIAGNOSIS — I130 Hypertensive heart and chronic kidney disease with heart failure and stage 1 through stage 4 chronic kidney disease, or unspecified chronic kidney disease: Secondary | ICD-10-CM | POA: Diagnosis not present

## 2022-03-24 DIAGNOSIS — I951 Orthostatic hypotension: Secondary | ICD-10-CM | POA: Diagnosis not present

## 2022-03-24 DIAGNOSIS — I5022 Chronic systolic (congestive) heart failure: Secondary | ICD-10-CM | POA: Diagnosis not present

## 2022-03-24 DIAGNOSIS — D631 Anemia in chronic kidney disease: Secondary | ICD-10-CM | POA: Diagnosis not present

## 2022-03-24 DIAGNOSIS — I472 Ventricular tachycardia, unspecified: Secondary | ICD-10-CM | POA: Diagnosis not present

## 2022-03-26 ENCOUNTER — Ambulatory Visit (INDEPENDENT_AMBULATORY_CARE_PROVIDER_SITE_OTHER): Payer: Medicare Other

## 2022-03-26 DIAGNOSIS — I472 Ventricular tachycardia, unspecified: Secondary | ICD-10-CM | POA: Diagnosis not present

## 2022-03-26 DIAGNOSIS — I130 Hypertensive heart and chronic kidney disease with heart failure and stage 1 through stage 4 chronic kidney disease, or unspecified chronic kidney disease: Secondary | ICD-10-CM | POA: Diagnosis not present

## 2022-03-26 DIAGNOSIS — I255 Ischemic cardiomyopathy: Secondary | ICD-10-CM | POA: Diagnosis not present

## 2022-03-26 DIAGNOSIS — I951 Orthostatic hypotension: Secondary | ICD-10-CM | POA: Diagnosis not present

## 2022-03-26 DIAGNOSIS — D631 Anemia in chronic kidney disease: Secondary | ICD-10-CM | POA: Diagnosis not present

## 2022-03-26 DIAGNOSIS — N184 Chronic kidney disease, stage 4 (severe): Secondary | ICD-10-CM | POA: Diagnosis not present

## 2022-03-26 DIAGNOSIS — I5022 Chronic systolic (congestive) heart failure: Secondary | ICD-10-CM | POA: Diagnosis not present

## 2022-03-26 LAB — CUP PACEART REMOTE DEVICE CHECK
Battery Remaining Longevity: 59 mo
Battery Voltage: 2.98 V
Brady Statistic AP VP Percent: 35.42 %
Brady Statistic AP VS Percent: 61.02 %
Brady Statistic AS VP Percent: 1.72 %
Brady Statistic AS VS Percent: 1.83 %
Brady Statistic RA Percent Paced: 96.06 %
Brady Statistic RV Percent Paced: 37.29 %
Date Time Interrogation Session: 20230727043724
HighPow Impedance: 37 Ohm
HighPow Impedance: 47 Ohm
Implantable Lead Implant Date: 20091103
Implantable Lead Implant Date: 20091103
Implantable Lead Location: 753859
Implantable Lead Location: 753860
Implantable Lead Model: 5076
Implantable Lead Model: 6947
Implantable Pulse Generator Implant Date: 20201028
Lead Channel Impedance Value: 342 Ohm
Lead Channel Impedance Value: 361 Ohm
Lead Channel Impedance Value: 399 Ohm
Lead Channel Pacing Threshold Amplitude: 0.5 V
Lead Channel Pacing Threshold Amplitude: 1.125 V
Lead Channel Pacing Threshold Pulse Width: 0.4 ms
Lead Channel Pacing Threshold Pulse Width: 0.4 ms
Lead Channel Sensing Intrinsic Amplitude: 1.75 mV
Lead Channel Sensing Intrinsic Amplitude: 1.75 mV
Lead Channel Sensing Intrinsic Amplitude: 6.375 mV
Lead Channel Sensing Intrinsic Amplitude: 6.375 mV
Lead Channel Setting Pacing Amplitude: 1.5 V
Lead Channel Setting Pacing Amplitude: 2.75 V
Lead Channel Setting Pacing Pulse Width: 0.6 ms
Lead Channel Setting Sensing Sensitivity: 0.3 mV

## 2022-03-27 ENCOUNTER — Other Ambulatory Visit (HOSPITAL_COMMUNITY): Payer: Self-pay | Admitting: *Deleted

## 2022-03-27 DIAGNOSIS — Z95811 Presence of heart assist device: Secondary | ICD-10-CM

## 2022-03-27 DIAGNOSIS — Z7901 Long term (current) use of anticoagulants: Secondary | ICD-10-CM

## 2022-03-30 DIAGNOSIS — M25562 Pain in left knee: Secondary | ICD-10-CM | POA: Diagnosis not present

## 2022-03-30 DIAGNOSIS — M25561 Pain in right knee: Secondary | ICD-10-CM | POA: Diagnosis not present

## 2022-03-31 ENCOUNTER — Ambulatory Visit (HOSPITAL_COMMUNITY): Payer: Self-pay | Admitting: Pharmacist

## 2022-03-31 ENCOUNTER — Ambulatory Visit (HOSPITAL_COMMUNITY)
Admission: RE | Admit: 2022-03-31 | Discharge: 2022-03-31 | Disposition: A | Discharge status: Home / Self Care | Payer: Medicare Other | Source: Ambulatory Visit | Attending: Cardiology | Admitting: Cardiology

## 2022-03-31 DIAGNOSIS — Z95811 Presence of heart assist device: Secondary | ICD-10-CM | POA: Insufficient documentation

## 2022-03-31 DIAGNOSIS — Z7901 Long term (current) use of anticoagulants: Secondary | ICD-10-CM | POA: Insufficient documentation

## 2022-03-31 LAB — PROTIME-INR
INR: 3 — ABNORMAL HIGH (ref 0.8–1.2)
Prothrombin Time: 31.2 seconds — ABNORMAL HIGH (ref 11.4–15.2)

## 2022-03-31 LAB — LACTATE DEHYDROGENASE: LDH: 332 U/L — ABNORMAL HIGH (ref 98–192)

## 2022-03-31 NOTE — Progress Notes (Signed)
LVAD INR 

## 2022-04-01 DIAGNOSIS — I130 Hypertensive heart and chronic kidney disease with heart failure and stage 1 through stage 4 chronic kidney disease, or unspecified chronic kidney disease: Secondary | ICD-10-CM | POA: Diagnosis not present

## 2022-04-01 DIAGNOSIS — I472 Ventricular tachycardia, unspecified: Secondary | ICD-10-CM | POA: Diagnosis not present

## 2022-04-01 DIAGNOSIS — I5022 Chronic systolic (congestive) heart failure: Secondary | ICD-10-CM | POA: Diagnosis not present

## 2022-04-01 DIAGNOSIS — N184 Chronic kidney disease, stage 4 (severe): Secondary | ICD-10-CM | POA: Diagnosis not present

## 2022-04-01 DIAGNOSIS — I951 Orthostatic hypotension: Secondary | ICD-10-CM | POA: Diagnosis not present

## 2022-04-01 DIAGNOSIS — D631 Anemia in chronic kidney disease: Secondary | ICD-10-CM | POA: Diagnosis not present

## 2022-04-03 DIAGNOSIS — I472 Ventricular tachycardia, unspecified: Secondary | ICD-10-CM | POA: Diagnosis not present

## 2022-04-03 DIAGNOSIS — I951 Orthostatic hypotension: Secondary | ICD-10-CM | POA: Diagnosis not present

## 2022-04-03 DIAGNOSIS — Z7901 Long term (current) use of anticoagulants: Secondary | ICD-10-CM | POA: Diagnosis not present

## 2022-04-03 DIAGNOSIS — I428 Other cardiomyopathies: Secondary | ICD-10-CM | POA: Diagnosis not present

## 2022-04-03 DIAGNOSIS — K769 Liver disease, unspecified: Secondary | ICD-10-CM | POA: Diagnosis not present

## 2022-04-03 DIAGNOSIS — E7800 Pure hypercholesterolemia, unspecified: Secondary | ICD-10-CM | POA: Diagnosis not present

## 2022-04-03 DIAGNOSIS — I2510 Atherosclerotic heart disease of native coronary artery without angina pectoris: Secondary | ICD-10-CM | POA: Diagnosis not present

## 2022-04-03 DIAGNOSIS — M79675 Pain in left toe(s): Secondary | ICD-10-CM | POA: Diagnosis not present

## 2022-04-03 DIAGNOSIS — K4090 Unilateral inguinal hernia, without obstruction or gangrene, not specified as recurrent: Secondary | ICD-10-CM | POA: Diagnosis not present

## 2022-04-03 DIAGNOSIS — I252 Old myocardial infarction: Secondary | ICD-10-CM | POA: Diagnosis not present

## 2022-04-03 DIAGNOSIS — F419 Anxiety disorder, unspecified: Secondary | ICD-10-CM | POA: Diagnosis not present

## 2022-04-03 DIAGNOSIS — I255 Ischemic cardiomyopathy: Secondary | ICD-10-CM | POA: Diagnosis not present

## 2022-04-03 DIAGNOSIS — I5022 Chronic systolic (congestive) heart failure: Secondary | ICD-10-CM | POA: Diagnosis not present

## 2022-04-03 DIAGNOSIS — I4510 Unspecified right bundle-branch block: Secondary | ICD-10-CM | POA: Diagnosis not present

## 2022-04-03 DIAGNOSIS — D631 Anemia in chronic kidney disease: Secondary | ICD-10-CM | POA: Diagnosis not present

## 2022-04-03 DIAGNOSIS — Z7952 Long term (current) use of systemic steroids: Secondary | ICD-10-CM | POA: Diagnosis not present

## 2022-04-03 DIAGNOSIS — E039 Hypothyroidism, unspecified: Secondary | ICD-10-CM | POA: Diagnosis not present

## 2022-04-03 DIAGNOSIS — R32 Unspecified urinary incontinence: Secondary | ICD-10-CM | POA: Diagnosis not present

## 2022-04-03 DIAGNOSIS — M109 Gout, unspecified: Secondary | ICD-10-CM | POA: Diagnosis not present

## 2022-04-03 DIAGNOSIS — I493 Ventricular premature depolarization: Secondary | ICD-10-CM | POA: Diagnosis not present

## 2022-04-03 DIAGNOSIS — Z96651 Presence of right artificial knee joint: Secondary | ICD-10-CM | POA: Diagnosis not present

## 2022-04-03 DIAGNOSIS — I4892 Unspecified atrial flutter: Secondary | ICD-10-CM | POA: Diagnosis not present

## 2022-04-03 DIAGNOSIS — Z7982 Long term (current) use of aspirin: Secondary | ICD-10-CM | POA: Diagnosis not present

## 2022-04-03 DIAGNOSIS — I130 Hypertensive heart and chronic kidney disease with heart failure and stage 1 through stage 4 chronic kidney disease, or unspecified chronic kidney disease: Secondary | ICD-10-CM | POA: Diagnosis not present

## 2022-04-03 DIAGNOSIS — N184 Chronic kidney disease, stage 4 (severe): Secondary | ICD-10-CM | POA: Diagnosis not present

## 2022-04-05 ENCOUNTER — Telehealth (HOSPITAL_COMMUNITY): Payer: Self-pay | Admitting: Unknown Physician Specialty

## 2022-04-05 NOTE — Telephone Encounter (Signed)
Received page from pts wife stating that the pt has been feeling weak the past 2 days. She states that the pt is feeling lightheaded when he is up moving around and that his BP has been low today. She states that she has given him 5 mg of Midodrine x 2 today. She states that at one point today his heart rate was elevated between 100-135. It is currently in the 90s. Pump flow is 3.6, PI 3.8. d/w Dr Haroldine Laws, pt/wife informed that if he starts to feel very poorly or worse this evening to go to the ED if not I have scheduled the pt a sick visit in the morning at 0900. Wife/pt verbalize understanding and are in agreement.  Tanda Rockers RN, BSN VAD Coordinator 24/7 Pager [phone]

## 2022-04-06 ENCOUNTER — Ambulatory Visit (HOSPITAL_COMMUNITY)
Admission: RE | Admit: 2022-04-06 | Discharge: 2022-04-06 | Disposition: A | Discharge status: Home / Self Care | Payer: Medicare Other | Source: Ambulatory Visit | Attending: Internal Medicine | Admitting: Internal Medicine

## 2022-04-06 ENCOUNTER — Other Ambulatory Visit (HOSPITAL_COMMUNITY): Payer: Self-pay | Admitting: Unknown Physician Specialty

## 2022-04-06 ENCOUNTER — Ambulatory Visit (HOSPITAL_COMMUNITY): Payer: Self-pay | Admitting: Pharmacist

## 2022-04-06 ENCOUNTER — Other Ambulatory Visit (HOSPITAL_COMMUNITY): Payer: Self-pay | Admitting: Internal Medicine

## 2022-04-06 VITALS — BP 112/0 | HR 82 | Ht 69.0 in | Wt 157.0 lb

## 2022-04-06 DIAGNOSIS — Z95810 Presence of automatic (implantable) cardiac defibrillator: Secondary | ICD-10-CM | POA: Insufficient documentation

## 2022-04-06 DIAGNOSIS — Z7982 Long term (current) use of aspirin: Secondary | ICD-10-CM | POA: Insufficient documentation

## 2022-04-06 DIAGNOSIS — E039 Hypothyroidism, unspecified: Secondary | ICD-10-CM | POA: Diagnosis not present

## 2022-04-06 DIAGNOSIS — I255 Ischemic cardiomyopathy: Secondary | ICD-10-CM

## 2022-04-06 DIAGNOSIS — E785 Hyperlipidemia, unspecified: Secondary | ICD-10-CM | POA: Diagnosis not present

## 2022-04-06 DIAGNOSIS — I472 Ventricular tachycardia, unspecified: Secondary | ICD-10-CM

## 2022-04-06 DIAGNOSIS — I4892 Unspecified atrial flutter: Secondary | ICD-10-CM | POA: Diagnosis not present

## 2022-04-06 DIAGNOSIS — I2510 Atherosclerotic heart disease of native coronary artery without angina pectoris: Secondary | ICD-10-CM | POA: Diagnosis not present

## 2022-04-06 DIAGNOSIS — D649 Anemia, unspecified: Secondary | ICD-10-CM | POA: Insufficient documentation

## 2022-04-06 DIAGNOSIS — I951 Orthostatic hypotension: Secondary | ICD-10-CM | POA: Diagnosis not present

## 2022-04-06 DIAGNOSIS — N184 Chronic kidney disease, stage 4 (severe): Secondary | ICD-10-CM | POA: Insufficient documentation

## 2022-04-06 DIAGNOSIS — Z7901 Long term (current) use of anticoagulants: Secondary | ICD-10-CM | POA: Diagnosis not present

## 2022-04-06 DIAGNOSIS — I5082 Biventricular heart failure: Secondary | ICD-10-CM | POA: Diagnosis not present

## 2022-04-06 DIAGNOSIS — I493 Ventricular premature depolarization: Secondary | ICD-10-CM | POA: Diagnosis not present

## 2022-04-06 DIAGNOSIS — I130 Hypertensive heart and chronic kidney disease with heart failure and stage 1 through stage 4 chronic kidney disease, or unspecified chronic kidney disease: Secondary | ICD-10-CM | POA: Insufficient documentation

## 2022-04-06 DIAGNOSIS — Z95811 Presence of heart assist device: Secondary | ICD-10-CM

## 2022-04-06 DIAGNOSIS — I5022 Chronic systolic (congestive) heart failure: Secondary | ICD-10-CM | POA: Diagnosis not present

## 2022-04-06 LAB — CBC
HCT: 34.6 % — ABNORMAL LOW (ref 39.0–52.0)
Hemoglobin: 11.1 g/dL — ABNORMAL LOW (ref 13.0–17.0)
MCH: 29.4 pg (ref 26.0–34.0)
MCHC: 32.1 g/dL (ref 30.0–36.0)
MCV: 91.5 fL (ref 80.0–100.0)
Platelets: 173 10*3/uL (ref 150–400)
RBC: 3.78 MIL/uL — ABNORMAL LOW (ref 4.22–5.81)
RDW: 16.5 % — ABNORMAL HIGH (ref 11.5–15.5)
WBC: 5.2 10*3/uL (ref 4.0–10.5)
nRBC: 0 % (ref 0.0–0.2)

## 2022-04-06 LAB — COMPREHENSIVE METABOLIC PANEL
ALT: 12 U/L (ref 0–44)
AST: 18 U/L (ref 15–41)
Albumin: 3.8 g/dL (ref 3.5–5.0)
Alkaline Phosphatase: 81 U/L (ref 38–126)
Anion gap: 8 (ref 5–15)
BUN: 34 mg/dL — ABNORMAL HIGH (ref 8–23)
CO2: 24 mmol/L (ref 22–32)
Calcium: 9.6 mg/dL (ref 8.9–10.3)
Chloride: 107 mmol/L (ref 98–111)
Creatinine, Ser: 2.54 mg/dL — ABNORMAL HIGH (ref 0.61–1.24)
GFR, Estimated: 25 mL/min — ABNORMAL LOW (ref >60)
Glucose, Bld: 133 mg/dL — ABNORMAL HIGH (ref 70–99)
Potassium: 4.8 mmol/L (ref 3.5–5.1)
Sodium: 139 mmol/L (ref 135–145)
Total Bilirubin: 0.6 mg/dL (ref 0.3–1.2)
Total Protein: 7.2 g/dL (ref 6.5–8.1)

## 2022-04-06 LAB — PROTIME-INR
INR: 3.1 — ABNORMAL HIGH (ref 0.8–1.2)
Prothrombin Time: 31.6 seconds — ABNORMAL HIGH (ref 11.4–15.2)

## 2022-04-06 LAB — LACTATE DEHYDROGENASE: LDH: 241 U/L — ABNORMAL HIGH (ref 98–192)

## 2022-04-06 MED ORDER — SOTALOL HCL 80 MG PO TABS: 80.0000 mg | ORAL_TABLET | Freq: Every day | ORAL | 1 refills | Status: DC

## 2022-04-06 MED ORDER — FLUDROCORTISONE ACETATE 0.1 MG PO TABS: 0.1000 mg | ORAL_TABLET | Freq: Every day | ORAL | 3 refills | Status: DC

## 2022-04-06 MED ORDER — CITALOPRAM HYDROBROMIDE 10 MG PO TABS: 10.0000 mg | ORAL_TABLET | Freq: Every day | ORAL | 3 refills | Status: DC

## 2022-04-06 NOTE — Progress Notes (Signed)
VAD CLINIC NOTE    HPI:  Johnny Oneill is a 81 y.o. 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   Presented to ER on 06/27/19 with syncopal episode and head injury from fall. ECG showed VT @ 240. Underwent emergent DC-CV in ER. Head CT no bleed. Admitted and had ICD gen replacement (EOL). -> Did not tolerate amio due to tremors.  Had ICD shock for VT on 06/09/20. Started on sotalol  On 02/06/21 Paced out of AFL.  Seen in ER 05/2021 due to LOW FLOW alarms. Found to be in VT rate 170. DCCV x 1 - returned to [place] with frequent PVCs. Pacer settings adjusted by [name] PA- DDD 90.  [place] 11/22 to assess for RV failure RA = 5 RV = 33/5 PA = 33/13 (20) PCW = 10 Fick cardiac output/index = 4.0/2.1 Thermo CO/CI = 4.9/2.6 PVR = 2.0 WU Ao sat = 95% PA sat = 54%, 59% PAPI = 4.0  Admitted 12/01/21 for R TKA. Did well with surgery but complicated by severe orthostatic hypotension. Midodrine added    Admitted 12/18/21 with syncope. No events on device. Hydrated. Midodrine increased to 15 tid. Sotalol held  Admitted 12/28/21 with recurrent VT. Had DC-CV in ER. Sotalol restarted. Continued to have elevated MAPs as well as orthostasis. Midodrine decreased and florinef added.   Admitted 02/09/22 with  a/c RV failure w/ cardiogenic shock. LFTs, INR, creatinine elevated on admit. Started on empiric milrinone with gradual improvement. LFTs/INR/creatinine improved. Had [place] primary left sided failure. LVAD speed optimized with speed increased to 9800.  Milrinone stopped without difficulty. INR followed closely by pharmacy with adjustments to coumadin.   Orthostatic hypotension resolved so florinef was stopped. Has been following the plan we discussed to take Cardura 1 mg BID if Doppler 120 or greater. If Doppler is 90 or less take Midodrine 5 mg.   Here with his wife for acute work-in visit. BP was running low over  weekend. He has not taken any Cardura since his last visit. He has been taking Midodrine 5 mg twice a day. Pt is checking BP in the morning and evening. Pt states that every afternoon he feels weak and dizzy. BP can be down in the 70s. Otherwise feels ok. Denies orthopnea or PND. No fevers, chills or problems with driveline. No bleeding, melena or neuro symptoms. No VAD alarms. Taking all meds as prescribed.   ICD interrogated today. No VT/AF. Optivol up Personally reviewed     VAD Indication: Destination therapy- patient choice     VAD interrogation & Equipment Management: Speed: 9800 Flow: 4 Power: 5.7 w    PI: 5.1   Alarms: none Events: 120 yesterday; 8 today  Fixed speed: 9800  Low speed limit: 9200   Primary Controller:  Replace back up battery in 26 months Back up controller:  Replace back up battery in 25 months-- not present today   Annual Equipment Maintenance on UBC/PM was performed 09/23/21   I reviewed the LVAD parameters from today and compared the results to the patient's prior recorded data. LVAD interrogation was NEGATIVE for significant power changes, NEGATIVE for clinical alarms and POSITIVE for PI events/speed drops. No programming changes were made and pump is functioning within specified parameters. Pt is performing daily controller and system monitor self tests along with completing weekly and monthly maintenance for LVAD equipment.      Past  Medical History:  Diagnosis Date   Anxiety    Automatic implantable cardioverter-defibrillator in situ    CAD (coronary artery disease)    S/P stenting of LCX in 2004;  s/p Lat MI 2009 with occlusion of the LCX - treated with Promus stenting. Has total occlusion of the RCA.    CHF (congestive heart failure) (HCC)    Hypercholesteremia    Hypertension    Hypothyroidism    ICD (implantable cardiac defibrillator) in place    PROPHYLACTIC      medtronic   Inferior MI ([place]) "? date"; 2009   Ischemic cardiomyopathy    a.  10/09 Echo: Sev LV Dysfxn, inf/lat AK, Mod MR.   Liver disease    LVAD (left ventricular assist device) present ([place])    Osteoarthritis    a. s/p R TKR   Pacemaker    PVC (premature ventricular contraction)    RBBB    Shortness of breath     Current Outpatient Medications  Medication Sig Dispense Refill   aspirin EC 81 MG tablet Take 81 mg by mouth in the morning.     Cholecalciferol (VITAMIN D3) 125 MCG (5000 UT) CAPS Take 5,000 Units by mouth daily with breakfast.     Coenzyme Q10 (COQ10) 200 MG CAPS Take 200 mg by mouth in the morning.     Famotidine (PEPCID PO) Take 1 tablet by mouth every morning.     fludrocortisone (FLORINEF) 0.1 MG tablet Take 1 tablet (0.1 mg total) by mouth daily. 30 tablet 3   levothyroxine (SYNTHROID) 175 MCG tablet 1 tablet daily every morning except half tablet on Sundays 90 tablet 0   melatonin 5 MG TABS Take 5 mg by mouth at bedtime as needed (for sleep).     mexiletine (MEXITIL) 150 MG capsule Take 1 capsule by mouth twice daily (Patient taking differently: Take 150 mg by mouth 2 (two) times daily.) 60 capsule 6   midodrine (PROAMATINE) 5 MG tablet Take 1 tablet (5 mg total) by mouth 2 (two) times daily as needed. Take 5 mg if doppler is 90 or less. 60 tablet 5   pantoprazole (PROTONIX) 40 MG tablet Take 1 tablet by mouth once daily (Patient taking differently: Take 40 mg by mouth daily before breakfast.) 90 tablet 3   Probiotic Product (PROBIOTIC PO) Take 1 capsule by mouth in the morning.     rosuvastatin (CRESTOR) 20 MG tablet Take 1 tablet (20 mg total) by mouth daily. (Patient taking differently: Take 20 mg by mouth every morning.) 90 tablet 3   traMADol (ULTRAM) 50 MG tablet Take 1-2 tablets (50-100 mg total) by mouth every 6 (six) hours as needed for moderate pain. 40 tablet 5   traZODone (DESYREL) 50 MG tablet TAKE 1 TABLET BY MOUTH AT BEDTIME (Patient taking differently: Take 50 mg by mouth at bedtime.) 30 tablet 6   warfarin (COUMADIN) 5 MG  tablet Take 5 mg (1 tablet) every day as directed by [facility] Clinic 135 tablet 11   acetaminophen (TYLENOL) 500 MG tablet Take 1,000 mg by mouth every 6 (six) hours as needed (for pain or headaches). (Patient not taking: Reported on 04/06/2022)     citalopram (CELEXA) 10 MG tablet Take 1 tablet by mouth once daily 90 tablet 3   doxazosin (CARDURA) 2 MG tablet Take 0.5 tablets (1 mg total) by mouth 2 (two) times daily. Take 0.5 tablet (1 mg total) by mouth 2 (two) times daily if Doppler BP 120 or greater. Check BP  in the morning and evening. (Patient not taking: Reported on 04/06/2022) 30 tablet 5   furosemide (LASIX) 20 MG tablet Take 1 tablet (20 mg total) by mouth daily as needed (Take 20 mg as daily as needed for wt > 153 lbs). Reported on 01/14/2016 (Patient not taking: Reported on 02/23/2022) 30 tablet 6   sotalol (BETAPACE) 80 MG tablet Take 1 tablet (80 mg total) by mouth at bedtime. 30 tablet 1   No current facility-administered medications for this encounter.   Facility-Administered Medications Ordered in Other Encounters  Medication Dose Route Frequency Provider Last Rate Last Admin   etomidate (AMIDATE) injection    Anesthesia Intra-op Myna Bright, CRNA   20 mg at 02/08/14 0915   rocuronium [facility]) injection    Anesthesia Intra-op Myna Bright, CRNA   50 mg at 02/08/14 0932   succinylcholine (ANECTINE) injection    Anesthesia Intra-op Myna Bright, CRNA   100 mg at 02/08/14 0915   Ace inhibitors, Amiodarone, Diovan [valsartan], Lipitor [atorvastatin calcium], Jardiance [empagliflozin], and Norvasc [amlodipine]  Review of systems complete and found to be negative unless listed in HPI.     Vitals:   04/06/22 0922 04/06/22 0923  BP: (!) 127/98 (!) 112/0  Pulse: 82   SpO2: 96%    Wt Readings from Last 3 Encounters:  04/06/22 71.2 kg (157 lb)  03/11/22 69.8 kg (153 lb 12.8 oz)  02/20/22 74.4 kg (164 lb)   Vital Signs:   Doppler Pressure: 112 Automatc BP: 127/98  (108) HR: 82   SPO2: 100% on RA   Weight: 157 lb w/ eqt Last weight: 153.8 lb w/eqt     Physical Exam: General:  NAD.  HEENT: normal  Neck: supple. JVP not elevated.  Carotids 2+ bilat; no bruits. No lymphadenopathy or thryomegaly appreciated. Cor: LVAD hum.  Lungs: Clear. Abdomen: obese soft, nontender, non-distended. No hepatosplenomegaly. No bruits or masses. Good bowel sounds. Driveline site clean. Anchor in place.  Extremities: no cyanosis, clubbing, rash. Warm trace edema  Neuro: alert & oriented x 3. No focal deficits. Moves all 4 without problem    ASSESSMENT AND PLAN:   1) HTN complicated by orthostatic hypotension - Continues to struggle with labile HTN. Unable to find sweet spot between high BP with high pump afterload and symptomatic low BP  - Continue midodrine 5 bid - Will switch sotalol to bedtime and see if this helps. If not, add back florinef 0.1 mg daily  - If fails this appraoch may need to go back on milrinone for RV support though IV access is an issue and he wants to avoid if at all possible   2) Chronic systolic HF: ICM, EF 24-40% s/p LVAD HM II implant 08/2013 for DT.   - Echo 3/18 EF 10-15% mild AI. Mod RV dysfunction. LVAD cannula OK - has struggled with RV failure however RHC 11/22 looked good (despite evidence of RVF on LE dopplers showing reflux - Stable NYHA III  - Volume status ok - Continue plan as above  -Failed Jardiance due to volume depletion - He has not tolerated ACE-I or amlodipine or b-blocker in the past.  3) LVAD: HMII 08/2013 for DT. - VAD interrogated personally. Parameters stable.' - Continue aspirin and coumadin.  - LDH 241 - INR 3.1 today Discussed dosing with PharmD personally. - DL site looks good - MAP management as above   4) VT/Frequent PVCs - now s/p ICD gen change.  - Off amio due to cerebellar side  effects.  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 - VT  06/16/21 with  DC-CV x 1 - Recurrent VT 12/28/21 after stopping sotalol.  Sotalol restarted. QT ok.  - PVCs improved on mexilitene. Continue mexilitene 150 bid - Keep K > 4.0 Mg > 2.0 - Continue to f/u with EP. - Continue sotalol and mexilitene. Switch sotalol to qhs - No VT on ICD today  5) Atrial flutter, recurrent - patient paced out of AFL in 6/22. Atrial therapies enabled  - continue sotalol (on for VT). QTs slightly prolonged but stable - In SR today  6) Chronic anticoagulation: INR goal 2.0-3.0.    - INR 3.1 today. Discussed dosing with PharmD personally. - Continue ASA 81 mg for LVAD + warfarin.   7) Hypothyroidism:  - Followed by Dr Dwyane Dee. Stable.No change   8) Hyperlipidemia:  - Continue Crestor. Zetia stopped due to cost.  - No change.   9) CAD - no s/s ischemia - continue Crestor  10) CKD IV - Creatinine baseline 1.8-2.4 -Today creatinine 2.5 - failed Jardiance - Has Nephrology referral  11) Anemia  - Hgb stable at 11.1 today  Total time spent 35 minutes. Over half that time spent discussing above.     Glori Bickers, MD  5:18 PM

## 2022-04-06 NOTE — Patient Instructions (Signed)
MOVE Sotalol dose to bedtime If moving the Sotalol to bedtime does not help please start Florinef 0.1 mg Return to clinic as scheduled in September

## 2022-04-06 NOTE — Progress Notes (Signed)
Patient presents for sick visit in [facility] today with [name]. Reports no problems with VAD equipment or concerns with drive line.   Pt ambulated into clinic unassisted. Pt/wife page over the weekend for weakness, lightheadness and low BP. Denies  falls, heart failure symptoms, and signs of bleeding.   Weight is up a few pounds today. Reports appetite has improved, and he has not needed Marinol.    pt has not taken any Cardura since his last visit. He has been taking Midodrine 5 mg twice a day. Pt is checking BP in the morning and evening. Pt states that every afternoon he feels weak and dizzy.  Vital Signs:   Doppler Pressure: 112 Automatc BP: 127/98 (108) HR: 82   SPO2: 100% on RA   Weight: 157 lb w/ eqt Last weight: 153.8 lb w/eqt   VAD Indication: Destination therapy- patient choice     VAD interrogation & Equipment Management: Speed: 9800 Flow: 4 Power: 5.7 w    PI: 5.1   Alarms: none Events: 120 yesterday; 8 today  Fixed speed: 9800  Low speed limit: 9200   Primary Controller:  Replace back up battery in 26 months Back up controller:  Replace back up battery in 25 months-- not present today   Annual Equipment Maintenance on UBC/PM was performed 09/23/21   I reviewed the LVAD parameters from today and compared the results to the patient's prior recorded data. LVAD interrogation was NEGATIVE for significant power changes, NEGATIVE for clinical alarms and POSITIVE for PI events/speed drops. No programming changes were made and pump is functioning within specified parameters. Pt is performing daily controller and system monitor self tests along with completing weekly and monthly maintenance for LVAD equipment.  Exit Site Care: Drive line is being maintained weekly by [name], his wife. Existing VAD dressing C/D/I Sorbaview dressing and Silverlon patch intact. Exit site healed and incorporated, the velour is fully implanted at exit site. Pt denies fever or chills. Pt  has sufficient dressing kits at home.   Significant Events on VAD Support:  10/2013>SBO 12/2016> elevated LDH, admit for Bival   Device: Dual Medtronic Therapies:  on 231  Pacing: AAI<=>DDD 80 bpm  Last check:  06/26/21  BP & Labs:  Doppler 112 - Doppler is reflecting modified systolic  Hgb 40.0- No S/S of bleeding. Specifically denies melena/BRBPR or nosebleeds.   LDH 241 - established baseline of 200- 350. Denies tea-colored urine. No power elevations noted on interrogation.    Patient Instructions:  MOVE Sotalol dose to bedtime If moving the Sotalol to bedtime does not help please START Florinef 0.1 mg Return to clinic as scheduled in September  Marieanne Marxen RN [place] Coordinator  Office: [phone]  24/7 Pager: [phone]

## 2022-04-08 DIAGNOSIS — I5022 Chronic systolic (congestive) heart failure: Secondary | ICD-10-CM | POA: Diagnosis not present

## 2022-04-08 DIAGNOSIS — I130 Hypertensive heart and chronic kidney disease with heart failure and stage 1 through stage 4 chronic kidney disease, or unspecified chronic kidney disease: Secondary | ICD-10-CM | POA: Diagnosis not present

## 2022-04-08 DIAGNOSIS — D631 Anemia in chronic kidney disease: Secondary | ICD-10-CM | POA: Diagnosis not present

## 2022-04-08 DIAGNOSIS — N184 Chronic kidney disease, stage 4 (severe): Secondary | ICD-10-CM | POA: Diagnosis not present

## 2022-04-08 DIAGNOSIS — I951 Orthostatic hypotension: Secondary | ICD-10-CM | POA: Diagnosis not present

## 2022-04-08 DIAGNOSIS — I472 Ventricular tachycardia, unspecified: Secondary | ICD-10-CM | POA: Diagnosis not present

## 2022-04-16 ENCOUNTER — Other Ambulatory Visit (HOSPITAL_COMMUNITY): Payer: Self-pay | Admitting: Unknown Physician Specialty

## 2022-04-16 DIAGNOSIS — Z7901 Long term (current) use of anticoagulants: Secondary | ICD-10-CM

## 2022-04-16 DIAGNOSIS — Z95811 Presence of heart assist device: Secondary | ICD-10-CM

## 2022-04-16 NOTE — Progress Notes (Signed)
Remote ICD transmission.   

## 2022-04-21 ENCOUNTER — Ambulatory Visit (HOSPITAL_COMMUNITY): Payer: Self-pay | Admitting: Pharmacist

## 2022-04-21 ENCOUNTER — Ambulatory Visit (HOSPITAL_COMMUNITY)
Admission: RE | Admit: 2022-04-21 | Discharge: 2022-04-21 | Disposition: A | Discharge status: Home / Self Care | Payer: Medicare Other | Source: Ambulatory Visit | Attending: Internal Medicine | Admitting: Internal Medicine

## 2022-04-21 DIAGNOSIS — Z95811 Presence of heart assist device: Secondary | ICD-10-CM | POA: Diagnosis not present

## 2022-04-21 DIAGNOSIS — Z7901 Long term (current) use of anticoagulants: Secondary | ICD-10-CM | POA: Diagnosis not present

## 2022-04-21 LAB — PROTIME-INR
INR: 4 — ABNORMAL HIGH (ref 0.8–1.2)
Prothrombin Time: 39 seconds — ABNORMAL HIGH (ref 11.4–15.2)

## 2022-04-21 LAB — LACTATE DEHYDROGENASE: LDH: 213 U/L — ABNORMAL HIGH (ref 98–192)

## 2022-05-05 ENCOUNTER — Ambulatory Visit (HOSPITAL_COMMUNITY): Payer: Self-pay | Admitting: Pharmacist

## 2022-05-05 ENCOUNTER — Other Ambulatory Visit (HOSPITAL_COMMUNITY): Payer: Self-pay | Admitting: Unknown Physician Specialty

## 2022-05-05 ENCOUNTER — Ambulatory Visit (HOSPITAL_COMMUNITY)
Admission: RE | Admit: 2022-05-05 | Discharge: 2022-05-05 | Disposition: A | Discharge status: Home / Self Care | Payer: Medicare Other | Source: Ambulatory Visit | Attending: Cardiology | Admitting: Cardiology

## 2022-05-05 DIAGNOSIS — Z7901 Long term (current) use of anticoagulants: Secondary | ICD-10-CM | POA: Insufficient documentation

## 2022-05-05 DIAGNOSIS — Z95811 Presence of heart assist device: Secondary | ICD-10-CM | POA: Insufficient documentation

## 2022-05-05 LAB — PROTIME-INR
INR: 3.6 — ABNORMAL HIGH (ref 0.8–1.2)
Prothrombin Time: 35.9 seconds — ABNORMAL HIGH (ref 11.4–15.2)

## 2022-05-06 ENCOUNTER — Other Ambulatory Visit (HOSPITAL_COMMUNITY): Payer: Medicare Other

## 2022-05-11 ENCOUNTER — Telehealth (HOSPITAL_COMMUNITY): Payer: Self-pay | Admitting: Unknown Physician Specialty

## 2022-05-11 NOTE — Telephone Encounter (Signed)
Pts wife called the VAD office with concerns about driveline. [name] states that when she changed the dressing last night that he had some redness around his site. Pt denies pain or tenderness or trauma. [name] states that there was no drainage. Pt has been sweating and working at [employer] this week. [name] was instructed to place a daily kit without the silver strip on [name] driveline and then change again on Wednesday to assess for decreased/increased redness. [name] states she will call the VAD office to let us know how driveline looks.     Tanda Rockers RN, BSN VAD Coordinator 24/7 Pager [phone]

## 2022-05-18 ENCOUNTER — Other Ambulatory Visit (HOSPITAL_COMMUNITY): Payer: Self-pay

## 2022-05-18 DIAGNOSIS — Z95811 Presence of heart assist device: Secondary | ICD-10-CM

## 2022-05-18 DIAGNOSIS — Z7901 Long term (current) use of anticoagulants: Secondary | ICD-10-CM

## 2022-05-18 DIAGNOSIS — I255 Ischemic cardiomyopathy: Secondary | ICD-10-CM

## 2022-05-19 ENCOUNTER — Other Ambulatory Visit (HOSPITAL_COMMUNITY): Payer: Self-pay

## 2022-05-19 ENCOUNTER — Ambulatory Visit (HOSPITAL_COMMUNITY)
Admission: RE | Admit: 2022-05-19 | Discharge: 2022-05-19 | Disposition: A | Discharge status: Home / Self Care | Payer: Medicare Other | Source: Ambulatory Visit | Attending: Cardiology | Admitting: Cardiology

## 2022-05-19 ENCOUNTER — Ambulatory Visit (HOSPITAL_COMMUNITY): Payer: Self-pay | Admitting: Pharmacist

## 2022-05-19 ENCOUNTER — Encounter (HOSPITAL_COMMUNITY): Payer: Self-pay

## 2022-05-19 VITALS — BP 130/0 | HR 81 | Wt 158.0 lb

## 2022-05-19 DIAGNOSIS — I255 Ischemic cardiomyopathy: Secondary | ICD-10-CM | POA: Insufficient documentation

## 2022-05-19 DIAGNOSIS — I2510 Atherosclerotic heart disease of native coronary artery without angina pectoris: Secondary | ICD-10-CM

## 2022-05-19 DIAGNOSIS — Z95811 Presence of heart assist device: Secondary | ICD-10-CM | POA: Insufficient documentation

## 2022-05-19 DIAGNOSIS — R55 Syncope and collapse: Secondary | ICD-10-CM

## 2022-05-19 DIAGNOSIS — Z7901 Long term (current) use of anticoagulants: Secondary | ICD-10-CM | POA: Diagnosis not present

## 2022-05-19 DIAGNOSIS — R42 Dizziness and giddiness: Secondary | ICD-10-CM | POA: Diagnosis not present

## 2022-05-19 DIAGNOSIS — N184 Chronic kidney disease, stage 4 (severe): Secondary | ICD-10-CM

## 2022-05-19 DIAGNOSIS — I472 Ventricular tachycardia, unspecified: Secondary | ICD-10-CM

## 2022-05-19 LAB — CBC
HCT: 34.7 % — ABNORMAL LOW (ref 39.0–52.0)
Hemoglobin: 11.3 g/dL — ABNORMAL LOW (ref 13.0–17.0)
MCH: 29.5 pg (ref 26.0–34.0)
MCHC: 32.6 g/dL (ref 30.0–36.0)
MCV: 90.6 fL (ref 80.0–100.0)
Platelets: 169 10*3/uL (ref 150–400)
RBC: 3.83 MIL/uL — ABNORMAL LOW (ref 4.22–5.81)
RDW: 16.1 % — ABNORMAL HIGH (ref 11.5–15.5)
WBC: 6 10*3/uL (ref 4.0–10.5)
nRBC: 0 % (ref 0.0–0.2)

## 2022-05-19 LAB — BASIC METABOLIC PANEL
Anion gap: 10 (ref 5–15)
BUN: 35 mg/dL — ABNORMAL HIGH (ref 8–23)
CO2: 22 mmol/L (ref 22–32)
Calcium: 9.4 mg/dL (ref 8.9–10.3)
Chloride: 105 mmol/L (ref 98–111)
Creatinine, Ser: 2.33 mg/dL — ABNORMAL HIGH (ref 0.61–1.24)
GFR, Estimated: 28 mL/min — ABNORMAL LOW (ref >60)
Glucose, Bld: 148 mg/dL — ABNORMAL HIGH (ref 70–99)
Potassium: 3.9 mmol/L (ref 3.5–5.1)
Sodium: 137 mmol/L (ref 135–145)

## 2022-05-19 LAB — PROTIME-INR
INR: 4 — ABNORMAL HIGH (ref 0.8–1.2)
Prothrombin Time: 38.9 seconds — ABNORMAL HIGH (ref 11.4–15.2)

## 2022-05-19 LAB — LACTATE DEHYDROGENASE: LDH: 204 U/L — ABNORMAL HIGH (ref 98–192)

## 2022-05-19 NOTE — Patient Instructions (Signed)
No medication changes today Continue to stay hydrated Coumadin dosing per Ander Purpura [facility] Follow up in clinic in 2 months

## 2022-05-19 NOTE — Progress Notes (Signed)
Patient presents for 2 month follow up [facility] today with his wife [facility]. Reports no problems with VAD equipment or concerns with drive line.   Pt ambulated into [place] unassisted. Pt states he has intermittent dizziness but otherwise has no complaints. 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 Denies  falls, heart failure symptoms, and signs of bleeding. Pt endorses good appetite and he has not needed Marinol.   Pt reports taking 5-41m of PRN Midodrine most days.Wife JBethena Royscontinues to take doppler pressure in mornings and afternoons. At previous visit we moved pt's Sotalol to at bedtime due to increased dizziness in the afternoon. Pt states seeing improvement in dizziness since this change was made.  JBethena Royscalled VAD office last Monday reporting redness around the driveline due to irriation from sweat. VAD Coordinator instructed them to switch to a gauze dressing and change every other day. JBethena Roysstates the redness has cleared and there was no drainage at the site. Plan to transition back to weekly dressing changes tonight.   Vital Signs:   Doppler Pressure: 130 Automatc BP: 139/107 (121) reports taking Midodrine prior to coming to [facility] this morning HR: 82   SPO2: 96% on RA   Weight: 158 lb w/ eqt Last weight: 157 lb w/eqt  VAD Indication: Destination therapy- patient choice     VAD interrogation & Equipment Management: Speed: 9800 Flow: 4.3 Power: 5.6 w    PI: 7.1   Alarms: none Events: 10-20 daily  Fixed speed: 9800  Low speed limit: 9200   Primary Controller:  Replace back up battery in 25 months Back up controller:  Replace back up battery in 24 months-- not present today   Annual Equipment Maintenance on UBC/PM was performed 09/23/21   I reviewed the LVAD parameters from today and compared the results to the patient's prior recorded data. LVAD interrogation was NEGATIVE for significant power changes, NEGATIVE for clinical alarms and POSITIVE for PI  events/speed drops. No programming changes were made and pump is functioning within specified parameters. Pt is performing daily controller and system monitor self tests along with completing weekly and monthly maintenance for LVAD equipment.  Exit Site Care: Drive line is being maintained weekly by JBethena [facility] his wife. Existing VAD dressing C/D/I. JBethena Roysstates improvement with redness at site from gauze dressing. She plans to redress this afternoon and transition back to the weekly dressing kit. Pt has sufficient dressing kits at home.   Significant Events on VAD Support:  10/2013>SBO 12/2016> elevated LDH, admit for Bival   Device: Dual Medtronic Therapies:  on 231  Pacing: AAI<=>DDD 80 bpm  Last check:  06/26/21  BP & Labs:  Doppler 130 - Doppler is reflecting modified systolic  Hgb 197.9 No S/S of bleeding. Specifically denies melena/BRBPR or nosebleeds.   LDH 204 - established baseline of 200- 350. Denies tea-colored urine. No power elevations noted on interrogation.   INR- 4.0- Lauren RPH to adjust Coumadin dose   Patient Instructions:  Continue to ensure adequate fluid intake especially when active No medication changes today [facility] OBobbye MortonRN, BSN VPowerCoordinator  Office: [phone] 24/7 Pager: [phone]

## 2022-05-23 NOTE — Progress Notes (Signed)
VAD CLINIC NOTE    HPI:  [facility] is a 81 y.o. 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   Presented to ER on 06/27/19 with syncopal episode and head injury from fall. ECG showed VT @ 240. Underwent emergent DC-CV in ER. Head CT no bleed. Admitted and had ICD gen replacement (EOL). -> Did not tolerate amio due to tremors.  Had ICD shock for VT on 06/09/20. Started on sotalol  On 02/06/21 Paced out of AFL.  Seen in ER 05/2021 due to LOW FLOW alarms. Found to be in VT rate 170. DCCV x 1 - returned to [place] with frequent PVCs. Pacer settings adjusted by [name] PA- DDD 90.  [place] 11/22 to assess for RV failure RA = 5 RV = 33/5 PA = 33/13 (20) PCW = 10 Fick cardiac output/index = 4.0/2.1 Thermo CO/CI = 4.9/2.6 PVR = 2.0 WU Ao sat = 95% PA sat = 54%, 59% PAPI = 4.0  Admitted 12/01/21 for R TKA. Did well with surgery but complicated by severe orthostatic hypotension. Midodrine added    Admitted 12/18/21 with syncope. No events on device. Hydrated. Midodrine increased to 15 tid. Sotalol held  Admitted 12/28/21 with recurrent VT. Had DC-CV in ER. Sotalol restarted. Continued to have elevated MAPs as well as orthostasis. Midodrine decreased and florinef added.   Admitted 02/09/22 with  a/c RV failure w/ cardiogenic shock. LFTs, INR, creatinine elevated on admit. Started on empiric milrinone with gradual improvement. LFTs/INR/creatinine improved. Had [place] primary left sided failure. LVAD speed optimized with speed increased to 9800.  Milrinone stopped without difficulty. INR followed closely by pharmacy with adjustments to coumadin.   Here with his wife Johnny [facility] for routine f/u. Doing much better. Follows BP regularly and takes midodrine as needed - usually about once/day. 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 Had some redness at DL site due to sweat but resolved with guaze dressing. Rare dizziness.  Denies orthopnea or PND. No fevers or chills. No bleeding, melena or neuro symptoms. No VAD alarms. Taking all meds as prescribed.     VAD Indication: Destination therapy- patient choice     VAD interrogation & Equipment Management: Speed: 9800 Flow: 4.3 Power: 5.6 w    PI: 7.1   Alarms: none Events: 10-20 daily  Fixed speed: 9800  Low speed limit: 9200   Primary Controller:  Replace back up battery in 25 months Back up controller:  Replace back up battery in 24 months-- not present today   Annual Equipment Maintenance on UBC/PM was performed 09/23/21   I reviewed the LVAD parameters from today and compared the results to the patient's prior recorded data. LVAD interrogation was NEGATIVE for significant power changes, NEGATIVE for clinical alarms and POSITIVE for PI events/speed drops. No programming changes were made and pump is functioning within specified parameters. Pt is performing daily controller and system monitor self tests along with completing weekly and monthly maintenance for LVAD equipment.      Past Medical History:  Diagnosis Date   Anxiety    Automatic implantable cardioverter-defibrillator in situ    CAD (coronary artery disease)    S/P stenting of LCX in 2004;  s/p Lat MI 2009 with occlusion of the LCX - treated with Promus stenting. Has total occlusion of the RCA.    CHF (congestive heart failure) (HCC)    Hypercholesteremia    Hypertension  Hypothyroidism    ICD (implantable cardiac defibrillator) in place    PROPHYLACTIC      medtronic   Inferior MI ([place]) "? date"; 2009   Ischemic cardiomyopathy    a. 10/09 Echo: Sev LV Dysfxn, inf/lat AK, Mod MR.   Liver disease    LVAD (left ventricular assist device) present ([place])    Osteoarthritis    a. s/p R TKR   Pacemaker    PVC (premature ventricular contraction)    RBBB    Shortness of breath     Current Outpatient Medications  Medication Sig Dispense Refill   aspirin EC 81 MG tablet Take 81 mg by  mouth in the morning.     Cholecalciferol (VITAMIN D3) 125 MCG (5000 UT) CAPS Take 5,000 Units by mouth daily with breakfast.     citalopram (CELEXA) 10 MG tablet Take 1 tablet by mouth once daily 90 tablet 3   Coenzyme Q10 (COQ10) 200 MG CAPS Take 200 mg by mouth in the morning.     Famotidine (PEPCID PO) Take 1 tablet by mouth every morning.     levothyroxine (SYNTHROID) 175 MCG tablet 1 tablet daily every morning except half tablet on Sundays 90 tablet 0   melatonin 5 MG TABS Take 5 mg by mouth at bedtime as needed (for sleep).     mexiletine (MEXITIL) 150 MG capsule Take 1 capsule by mouth twice daily (Patient taking differently: Take 150 mg by mouth 2 (two) times daily.) 60 capsule 6   midodrine (PROAMATINE) 5 MG tablet Take 1 tablet (5 mg total) by mouth 2 (two) times daily as needed. Take 5 mg if doppler is 90 or less. 60 tablet 5   pantoprazole (PROTONIX) 40 MG tablet Take 1 tablet by mouth once daily (Patient taking differently: Take 40 mg by mouth daily before breakfast.) 90 tablet 3   Probiotic Product (PROBIOTIC PO) Take 1 capsule by mouth in the morning.     rosuvastatin (CRESTOR) 20 MG tablet Take 1 tablet (20 mg total) by mouth daily. (Patient taking differently: Take 20 mg by mouth every morning.) 90 tablet 3   sotalol (BETAPACE) 80 MG tablet Take 1 tablet (80 mg total) by mouth at bedtime. 30 tablet 1   traMADol (ULTRAM) 50 MG tablet Take 1-2 tablets (50-100 mg total) by mouth every 6 (six) hours as needed for moderate pain. 40 tablet 5   traZODone (DESYREL) 50 MG tablet TAKE 1 TABLET BY MOUTH AT BEDTIME (Patient taking differently: Take 50 mg by mouth at bedtime.) 30 tablet 6   warfarin (COUMADIN) 5 MG tablet Take 5 mg (1 tablet) every day as directed by [facility] Clinic 135 tablet 11   acetaminophen (TYLENOL) 500 MG tablet Take 1,000 mg by mouth every 6 (six) hours as needed (for pain or headaches). (Patient not taking: Reported on 04/06/2022)     doxazosin (CARDURA) 2 MG tablet Take 0.5  tablets (1 mg total) by mouth 2 (two) times daily. Take 0.5 tablet (1 mg total) by mouth 2 (two) times daily if Doppler BP 120 or greater. Check BP in the morning and evening. (Patient not taking: Reported on 04/06/2022) 30 tablet 5   fludrocortisone (FLORINEF) 0.1 MG tablet Take 1 tablet (0.1 mg total) by mouth daily. (Patient not taking: Reported on 05/19/2022) 30 tablet 3   furosemide (LASIX) 20 MG tablet Take 1 tablet (20 mg total) by mouth daily as needed (Take 20 mg as daily as needed for wt > 153 lbs). Reported on  01/14/2016 (Patient not taking: Reported on 02/23/2022) 30 tablet 6   No current facility-administered medications for this encounter.   Facility-Administered Medications Ordered in Other Encounters  Medication Dose Route Frequency Provider Last Rate Last Admin   etomidate (AMIDATE) injection    Anesthesia Intra-op Myna Bright, CRNA   20 mg at 02/08/14 0915   rocuronium [facility]) injection    Anesthesia Intra-op Myna Bright, CRNA   50 mg at 02/08/14 0932   succinylcholine (ANECTINE) injection    Anesthesia Intra-op Myna Bright, CRNA   100 mg at 02/08/14 0915   Ace inhibitors, Amiodarone, Diovan [valsartan], Lipitor [atorvastatin calcium], Jardiance [empagliflozin], and Norvasc [amlodipine]  Review of systems complete and found to be negative unless listed in HPI.     Vitals:   05/19/22 1116 05/19/22 1117  BP: (!) 139/107 (!) 130/0  Pulse: 81   SpO2: 98%    Wt Readings from Last 3 Encounters:  05/19/22 71.7 kg (158 lb)  04/06/22 71.2 kg (157 lb)  03/11/22 69.8 kg (153 lb 12.8 oz)   Vital Signs:   Doppler Pressure: 130 Automatc BP: 139/107 (121) reports taking Midodrine prior to coming to clinic this morning HR: 82   SPO2: 96% on RA   Weight: 158 lb w/ eqt Last weight: 157 lb w/eqt   Physical Exam: General:  NAD.  HEENT: normal  Neck: supple. JVP 6-7  Carotids 2+ bilat; no bruits. No lymphadenopathy or thryomegaly appreciated. Cor: LVAD hum.   Lungs: Clear. Abdomen: soft, nontender, non-distended. No hepatosplenomegaly. No bruits or masses. Good bowel sounds. Driveline site clean. Anchor in place.  Extremities: no cyanosis, clubbing, rash. Warm no edema  Neuro: alert & oriented x 3. No focal deficits. Moves all 4 without problem    ASSESSMENT AND PLAN:   1) HTN complicated by orthostatic hypotension - Much improved with close BP monitoring and prn midodrine   2) Chronic systolic HF: ICM, EF 02-58% s/p LVAD HM II implant 08/2013 for DT.   - Echo 3/18 EF 10-15% mild AI. Mod RV dysfunction. LVAD cannula OK - has struggled with RV failure however RHC 11/22 looked good (despite evidence of RVF on LE dopplers showing reflux - Improved NYHA II-III - Volume status ok. Avoid volume depletion  -Failed Jardiance due to volume depletion - He has not tolerated ACE-I or amlodipine or b-blocker in the past.  3) LVAD: HMII 08/2013 for DT. - VAD interrogated personally. Parameters stable. - Continue aspirin and coumadin.  - LDH 204 - INR 4.0  - DL site ok - MAP management as above   4) VT/Frequent PVCs - now s/p ICD gen change.  - Off amio due to cerebellar side effects.  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 - VT  06/16/21 with  DC-CV x 1 - Recurrent VT 12/28/21 after stopping sotalol. Sotalol restarted. QT ok.  - PVCs improved on mexilitene. Continue mexilitene 150 bid - Keep K > 4.0 Mg > 2.0 - Continue to f/u with EP. - Continue sotalol and mexilitene. QT interval ok  - No recent VT  5) Atrial flutter, recurrent - patient paced out of AFL in 6/22. Atrial therapies enabled  - continue sotalol (on for VT). QTs slightly prolonged but stable - In SR today  6) Chronic anticoagulation: INR goal 2.0-3.0.    - INR 4.0 today. Discussed dosing with PharmD personally. - Continue ASA 81 mg for LVAD + warfarin.   7) Hypothyroidism:  - Followed by Dr Dwyane Dee. Stable.No change   8)  Hyperlipidemia:  - Continue Crestor. Zetia stopped due to cost.  -  No change.   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 Over half that time spent discussing above.    Glori Bickers, MD  10:58 PM

## 2022-05-28 ENCOUNTER — Other Ambulatory Visit (HOSPITAL_COMMUNITY): Payer: Self-pay | Admitting: *Deleted

## 2022-05-28 DIAGNOSIS — Z95811 Presence of heart assist device: Secondary | ICD-10-CM

## 2022-05-28 DIAGNOSIS — Z7901 Long term (current) use of anticoagulants: Secondary | ICD-10-CM

## 2022-06-03 ENCOUNTER — Ambulatory Visit (HOSPITAL_COMMUNITY)
Admission: RE | Admit: 2022-06-03 | Discharge: 2022-06-03 | Disposition: A | Discharge status: Home / Self Care | Payer: Medicare Other | Source: Ambulatory Visit | Attending: Cardiology | Admitting: Cardiology

## 2022-06-03 ENCOUNTER — Ambulatory Visit (HOSPITAL_COMMUNITY): Payer: Self-pay | Admitting: Pharmacist

## 2022-06-03 DIAGNOSIS — Z7901 Long term (current) use of anticoagulants: Secondary | ICD-10-CM | POA: Insufficient documentation

## 2022-06-03 DIAGNOSIS — Z95811 Presence of heart assist device: Secondary | ICD-10-CM | POA: Diagnosis not present

## 2022-06-03 LAB — PROTIME-INR
INR: 2.5 — ABNORMAL HIGH (ref 0.8–1.2)
Prothrombin Time: 26.4 seconds — ABNORMAL HIGH (ref 11.4–15.2)

## 2022-06-03 NOTE — Progress Notes (Signed)
LVAD INR 

## 2022-06-10 ENCOUNTER — Other Ambulatory Visit: Payer: Self-pay

## 2022-06-10 ENCOUNTER — Telehealth: Payer: Self-pay

## 2022-06-10 DIAGNOSIS — E890 Postprocedural hypothyroidism: Secondary | ICD-10-CM

## 2022-06-10 MED ORDER — LEVOTHYROXINE SODIUM 175 MCG PO TABS: ORAL_TABLET | ORAL | 2 refills | Status: DC

## 2022-06-10 NOTE — Telephone Encounter (Signed)
Patient left voicemail about Rx for medication but did not leave medication on voicemail. I tried calling back but no answer. Left vm on both numbers

## 2022-06-19 ENCOUNTER — Other Ambulatory Visit (HOSPITAL_COMMUNITY): Payer: Self-pay | Admitting: *Deleted

## 2022-06-19 DIAGNOSIS — Z7901 Long term (current) use of anticoagulants: Secondary | ICD-10-CM

## 2022-06-19 DIAGNOSIS — Z95811 Presence of heart assist device: Secondary | ICD-10-CM

## 2022-06-23 ENCOUNTER — Ambulatory Visit (HOSPITAL_COMMUNITY): Payer: Self-pay | Admitting: Pharmacist

## 2022-06-23 ENCOUNTER — Ambulatory Visit (HOSPITAL_COMMUNITY)
Admission: RE | Admit: 2022-06-23 | Discharge: 2022-06-23 | Disposition: A | Discharge status: Home / Self Care | Payer: Medicare Other | Source: Ambulatory Visit | Attending: Cardiology | Admitting: Cardiology

## 2022-06-23 DIAGNOSIS — Z7901 Long term (current) use of anticoagulants: Secondary | ICD-10-CM | POA: Insufficient documentation

## 2022-06-23 DIAGNOSIS — Z95811 Presence of heart assist device: Secondary | ICD-10-CM | POA: Insufficient documentation

## 2022-06-23 LAB — PROTIME-INR
INR: 2.5 — ABNORMAL HIGH (ref 0.8–1.2)
Prothrombin Time: 26.6 seconds — ABNORMAL HIGH (ref 11.4–15.2)

## 2022-06-24 ENCOUNTER — Other Ambulatory Visit (HOSPITAL_COMMUNITY): Payer: Medicare Other

## 2022-06-25 ENCOUNTER — Ambulatory Visit (INDEPENDENT_AMBULATORY_CARE_PROVIDER_SITE_OTHER): Payer: Medicare Other

## 2022-06-25 DIAGNOSIS — I255 Ischemic cardiomyopathy: Secondary | ICD-10-CM | POA: Diagnosis not present

## 2022-06-25 LAB — CUP PACEART REMOTE DEVICE CHECK
Battery Remaining Longevity: 63 mo
Battery Voltage: 2.97 V
Brady Statistic AP VP Percent: 15.84 %
Brady Statistic AP VS Percent: 82.82 %
Brady Statistic AS VP Percent: 0.44 %
Brady Statistic AS VS Percent: 0.9 %
Brady Statistic RA Percent Paced: 98.51 %
Brady Statistic RV Percent Paced: 16.72 %
Date Time Interrogation Session: 20231026012302
HighPow Impedance: 41 Ohm
HighPow Impedance: 51 Ohm
Implantable Lead Connection Status: 753985
Implantable Lead Connection Status: 753985
Implantable Lead Implant Date: 20091103
Implantable Lead Implant Date: 20091103
Implantable Lead Location: 753859
Implantable Lead Location: 753860
Implantable Lead Model: 5076
Implantable Lead Model: 6947
Implantable Pulse Generator Implant Date: 20201028
Lead Channel Impedance Value: 361 Ohm
Lead Channel Impedance Value: 399 Ohm
Lead Channel Impedance Value: 418 Ohm
Lead Channel Pacing Threshold Amplitude: 0.5 V
Lead Channel Pacing Threshold Amplitude: 1.25 V
Lead Channel Pacing Threshold Pulse Width: 0.4 ms
Lead Channel Pacing Threshold Pulse Width: 0.4 ms
Lead Channel Sensing Intrinsic Amplitude: 2.75 mV
Lead Channel Sensing Intrinsic Amplitude: 2.75 mV
Lead Channel Sensing Intrinsic Amplitude: 6.5 mV
Lead Channel Sensing Intrinsic Amplitude: 6.5 mV
Lead Channel Setting Pacing Amplitude: 1.5 V
Lead Channel Setting Pacing Amplitude: 2.75 V
Lead Channel Setting Pacing Pulse Width: 0.6 ms
Lead Channel Setting Sensing Sensitivity: 0.3 mV

## 2022-06-29 DIAGNOSIS — M25561 Pain in right knee: Secondary | ICD-10-CM | POA: Diagnosis not present

## 2022-07-02 ENCOUNTER — Other Ambulatory Visit (HOSPITAL_COMMUNITY): Payer: Self-pay

## 2022-07-02 DIAGNOSIS — G8929 Other chronic pain: Secondary | ICD-10-CM

## 2022-07-03 NOTE — Progress Notes (Signed)
Remote ICD transmission.   

## 2022-07-08 DIAGNOSIS — Z23 Encounter for immunization: Secondary | ICD-10-CM | POA: Diagnosis not present

## 2022-07-17 ENCOUNTER — Other Ambulatory Visit (HOSPITAL_COMMUNITY): Payer: Self-pay | Admitting: *Deleted

## 2022-07-17 DIAGNOSIS — Z7901 Long term (current) use of anticoagulants: Secondary | ICD-10-CM

## 2022-07-17 DIAGNOSIS — Z95811 Presence of heart assist device: Secondary | ICD-10-CM

## 2022-07-20 ENCOUNTER — Ambulatory Visit (HOSPITAL_COMMUNITY): Payer: Self-pay | Admitting: Pharmacist

## 2022-07-20 ENCOUNTER — Ambulatory Visit (HOSPITAL_COMMUNITY)
Admission: RE | Admit: 2022-07-20 | Discharge: 2022-07-20 | Disposition: A | Discharge status: Home / Self Care | Payer: Medicare Other | Source: Ambulatory Visit | Attending: Internal Medicine | Admitting: Internal Medicine

## 2022-07-20 VITALS — BP 131/96 | HR 82 | Ht 69.0 in | Wt 162.4 lb

## 2022-07-20 DIAGNOSIS — N184 Chronic kidney disease, stage 4 (severe): Secondary | ICD-10-CM

## 2022-07-20 DIAGNOSIS — I255 Ischemic cardiomyopathy: Secondary | ICD-10-CM | POA: Diagnosis not present

## 2022-07-20 DIAGNOSIS — R42 Dizziness and giddiness: Secondary | ICD-10-CM

## 2022-07-20 DIAGNOSIS — I5022 Chronic systolic (congestive) heart failure: Secondary | ICD-10-CM

## 2022-07-20 DIAGNOSIS — D649 Anemia, unspecified: Secondary | ICD-10-CM | POA: Diagnosis not present

## 2022-07-20 DIAGNOSIS — Z7901 Long term (current) use of anticoagulants: Secondary | ICD-10-CM | POA: Diagnosis not present

## 2022-07-20 DIAGNOSIS — Z7984 Long term (current) use of oral hypoglycemic drugs: Secondary | ICD-10-CM | POA: Insufficient documentation

## 2022-07-20 DIAGNOSIS — F419 Anxiety disorder, unspecified: Secondary | ICD-10-CM | POA: Diagnosis not present

## 2022-07-20 DIAGNOSIS — E7800 Pure hypercholesterolemia, unspecified: Secondary | ICD-10-CM | POA: Diagnosis not present

## 2022-07-20 DIAGNOSIS — E039 Hypothyroidism, unspecified: Secondary | ICD-10-CM | POA: Insufficient documentation

## 2022-07-20 DIAGNOSIS — Z79890 Hormone replacement therapy (postmenopausal): Secondary | ICD-10-CM | POA: Diagnosis not present

## 2022-07-20 DIAGNOSIS — I951 Orthostatic hypotension: Secondary | ICD-10-CM | POA: Insufficient documentation

## 2022-07-20 DIAGNOSIS — I4510 Unspecified right bundle-branch block: Secondary | ICD-10-CM | POA: Diagnosis not present

## 2022-07-20 DIAGNOSIS — I5082 Biventricular heart failure: Secondary | ICD-10-CM | POA: Diagnosis not present

## 2022-07-20 DIAGNOSIS — I130 Hypertensive heart and chronic kidney disease with heart failure and stage 1 through stage 4 chronic kidney disease, or unspecified chronic kidney disease: Secondary | ICD-10-CM | POA: Insufficient documentation

## 2022-07-20 DIAGNOSIS — Z95810 Presence of automatic (implantable) cardiac defibrillator: Secondary | ICD-10-CM | POA: Insufficient documentation

## 2022-07-20 DIAGNOSIS — K219 Gastro-esophageal reflux disease without esophagitis: Secondary | ICD-10-CM | POA: Diagnosis not present

## 2022-07-20 DIAGNOSIS — Z7982 Long term (current) use of aspirin: Secondary | ICD-10-CM | POA: Diagnosis not present

## 2022-07-20 DIAGNOSIS — Z95811 Presence of heart assist device: Secondary | ICD-10-CM | POA: Diagnosis not present

## 2022-07-20 DIAGNOSIS — I2510 Atherosclerotic heart disease of native coronary artery without angina pectoris: Secondary | ICD-10-CM | POA: Insufficient documentation

## 2022-07-20 DIAGNOSIS — R55 Syncope and collapse: Secondary | ICD-10-CM | POA: Diagnosis not present

## 2022-07-20 DIAGNOSIS — I472 Ventricular tachycardia, unspecified: Secondary | ICD-10-CM | POA: Diagnosis not present

## 2022-07-20 DIAGNOSIS — I4892 Unspecified atrial flutter: Secondary | ICD-10-CM | POA: Insufficient documentation

## 2022-07-20 DIAGNOSIS — Z79899 Other long term (current) drug therapy: Secondary | ICD-10-CM | POA: Diagnosis not present

## 2022-07-20 DIAGNOSIS — M1990 Unspecified osteoarthritis, unspecified site: Secondary | ICD-10-CM | POA: Insufficient documentation

## 2022-07-20 DIAGNOSIS — I252 Old myocardial infarction: Secondary | ICD-10-CM | POA: Diagnosis not present

## 2022-07-20 DIAGNOSIS — E785 Hyperlipidemia, unspecified: Secondary | ICD-10-CM | POA: Diagnosis not present

## 2022-07-20 LAB — CBC
HCT: 37.4 % — ABNORMAL LOW (ref 39.0–52.0)
Hemoglobin: 12.3 g/dL — ABNORMAL LOW (ref 13.0–17.0)
MCH: 30.4 pg (ref 26.0–34.0)
MCHC: 32.9 g/dL (ref 30.0–36.0)
MCV: 92.6 fL (ref 80.0–100.0)
Platelets: 163 10*3/uL (ref 150–400)
RBC: 4.04 MIL/uL — ABNORMAL LOW (ref 4.22–5.81)
RDW: 15.6 % — ABNORMAL HIGH (ref 11.5–15.5)
WBC: 5 10*3/uL (ref 4.0–10.5)
nRBC: 0 % (ref 0.0–0.2)

## 2022-07-20 LAB — BASIC METABOLIC PANEL
Anion gap: 8 (ref 5–15)
BUN: 30 mg/dL — ABNORMAL HIGH (ref 8–23)
CO2: 24 mmol/L (ref 22–32)
Calcium: 9.3 mg/dL (ref 8.9–10.3)
Chloride: 108 mmol/L (ref 98–111)
Creatinine, Ser: 2.19 mg/dL — ABNORMAL HIGH (ref 0.61–1.24)
GFR, Estimated: 30 mL/min — ABNORMAL LOW (ref >60)
Glucose, Bld: 94 mg/dL (ref 70–99)
Potassium: 4.2 mmol/L (ref 3.5–5.1)
Sodium: 140 mmol/L (ref 135–145)

## 2022-07-20 LAB — PROTIME-INR
INR: 2.7 — ABNORMAL HIGH (ref 0.8–1.2)
Prothrombin Time: 28.5 seconds — ABNORMAL HIGH (ref 11.4–15.2)

## 2022-07-20 LAB — LACTATE DEHYDROGENASE: LDH: 254 U/L — ABNORMAL HIGH (ref 98–192)

## 2022-07-20 NOTE — Patient Instructions (Addendum)
Continue to ensure adequate fluid intake especially when active No medication changes today Coumadin dosing per Ander Purpura [facility] Return to clinic in 2 months

## 2022-07-20 NOTE — Progress Notes (Signed)
Patient presents for 2 month follow up [facility] today with [name]. Reports no problems with VAD equipment or concerns with drive line.   Pt ambulated into clinic unassisted. Pt states he has intermittent dizziness but otherwise has no complaints. He has been cleaning the church and staying active. Denies falls, heart failure symptoms, and signs of bleeding. Pt endorses good appetite and he has not needed Marinol.   Pt reports taking 5-10 mg of PRN Midodrine most days.Wife [name] continues to take doppler pressure in mornings and afternoons. At previous visit we moved pt's Sotalol to at bedtime due to increased dizziness in the afternoon. Pt states seeing improvement in dizziness since this change was made.  EKG obtained today.  Vital Signs:   Doppler Pressure: 122 Automatc BP: 131/96 (112) HR: 82   SPO2: 100% on RA   Weight: 162.4 lb w/ eqt Last weight: 158 lb w/eqt  VAD Indication: Destination therapy- patient choice     VAD interrogation & Equipment Management: Speed: 9800 Flow: 4.1 Power: 5.7 w    PI: 5.8   Alarms: none Events: 10-20 daily; outlier on 11/14--pt was working at [employer] didn't drink had 50+ PI  Fixed speed: 9800  Low speed limit: 9200   Primary Controller:  Replace back up battery in 23 months Back up controller:  Replace back up battery in 22 months-- not present today   Annual Equipment Maintenance on UBC/PM was performed 09/23/21   I reviewed the LVAD parameters from today and compared the results to the patient's prior recorded data. LVAD interrogation was NEGATIVE for significant power changes, NEGATIVE for clinical alarms and POSITIVE for PI events/speed drops. No programming changes were made and pump is functioning within specified parameters. Pt is performing daily controller and system monitor self tests along with completing weekly and monthly maintenance for LVAD equipment.  Exit Site Care: Drive line is being maintained weekly by [name], his  wife. Existing VAD dressing C/D/I. [name] states improvement with redness at site from gauze dressing. Pt provided with 8 weekly dressings for home.   Significant Events on VAD Support:  10/2013>SBO 12/2016> elevated LDH, admit for Bival   Device: Dual Medtronic Therapies:  on 231  Pacing: AAI<=>DDD 80 bpm  Last check:  06/26/21  BP & Labs:  Doppler 122 - Doppler is reflecting modified systolic  Hgb 44.8 - No S/S of bleeding. Specifically denies melena/BRBPR or nosebleeds.   LDH 254 - established baseline of 200- 350. Denies tea-colored urine. No power elevations noted on interrogation.    Patient Instructions:  Continue to ensure adequate fluid intake especially when active No medication changes today Coumadin dosing per Ander Purpura [facility] Return to clinic in 2 mo  Tanda Rockers RN, BSN [place] Coordinator  Office: [phone]  24/7 Pager: [phone]

## 2022-07-20 NOTE — Progress Notes (Addendum)
VAD CLINIC NOTE    HPI:  Johnny Oneill is a 81 y.o. 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   Presented to ER on 06/27/19 with syncopal episode and head injury from fall. ECG showed VT @ 240. Underwent emergent DC-CV in ER. Head CT no bleed. Admitted and had ICD gen replacement (EOL). -> Did not tolerate amio due to tremors.  Had ICD shock for VT on 06/09/20. Started on sotalol  On 02/06/21 Paced out of AFL.  Seen in ER 05/2021 due to LOW FLOW alarms. Found to be in VT rate 170. DCCV x 1 - returned to [place] with frequent PVCs. Pacer settings adjusted by [name] PA- DDD 90.  [place] 11/22 to assess for RV failure RA = 5 RV = 33/5 PA = 33/13 (20) PCW = 10 Fick cardiac output/index = 4.0/2.1 Thermo CO/CI = 4.9/2.6 PVR = 2.0 WU Ao sat = 95% PA sat = 54%, 59% PAPI = 4.0  Admitted 12/01/21 for R TKA. Did well with surgery but complicated by severe orthostatic hypotension. Midodrine added    Admitted 12/18/21 with syncope. No events on device. Hydrated. Midodrine increased to 15 tid. Sotalol held  Admitted 12/28/21 with recurrent VT. Had DC-CV in ER. Sotalol restarted. Continued to have elevated MAPs as well as orthostasis. Midodrine decreased and florinef added.   Admitted 02/09/22 with  a/c RV failure w/ cardiogenic shock. LFTs, INR, creatinine elevated on admit. Started on empiric milrinone with gradual improvement. LFTs/INR/creatinine improved. Had [place] primary left sided failure. LVAD speed optimized with speed increased to 9800.  Milrinone stopped without difficulty. INR followed closely by pharmacy with adjustments to coumadin.   Here for f/u. Doing great. Following BP closely and taking midodrine for low BPS and doxazosin for high BPs. No further presyncope. Active with Volleyball refereeing. Denies orthopnea or PND. No fevers, chills or problems with driveline. No bleeding, melena or neuro  symptoms. No VAD alarms. Taking all meds as prescribed.     VAD Indication: Destination therapy- patient choice     VAD interrogation & Equipment Management: Speed: 9800 Flow: 4.1 Power: 5.7 w    PI: 5.8   Alarms: none Events: 10-20 daily; outlier on 11/14--pt was working at [employer] didn't drink had 50+ PI  Fixed speed: 9800  Low speed limit: 9200   Primary Controller:  Replace back up battery in 23 months Back up controller:  Replace back up battery in 22 months-- not present today   Annual Equipment Maintenance on UBC/PM was performed 09/23/21   I reviewed the LVAD parameters from today and compared the results to the patient's prior recorded data. LVAD interrogation was NEGATIVE for significant power changes, NEGATIVE for clinical alarms and POSITIVE for PI events/speed drops. No programming changes were made and pump is functioning within specified parameters. Pt is performing daily controller and system monitor self tests along with completing weekly and monthly maintenance for LVAD equipment.     I reviewed the LVAD parameters from today and compared the results to the patient's prior recorded data. LVAD interrogation was NEGATIVE for significant power changes, NEGATIVE for clinical alarms and POSITIVE for PI events/speed drops. No programming changes were made and pump is functioning within specified parameters. Pt is performing daily controller and system monitor self tests along with completing weekly and monthly maintenance for LVAD equipment.      Past Medical History:  Diagnosis  Date   Anxiety    Automatic implantable cardioverter-defibrillator in situ    CAD (coronary artery disease)    S/P stenting of LCX in 2004;  s/p Lat MI 2009 with occlusion of the LCX - treated with Promus stenting. Has total occlusion of the RCA.    CHF (congestive heart failure) (HCC)    Hypercholesteremia    Hypertension    Hypothyroidism    ICD (implantable cardiac defibrillator) in place     PROPHYLACTIC      medtronic   Inferior MI ([place]) "? date"; 2009   Ischemic cardiomyopathy    a. 10/09 Echo: Sev LV Dysfxn, inf/lat AK, Mod MR.   Liver disease    LVAD (left ventricular assist device) present ([place])    Osteoarthritis    a. s/p R TKR   Pacemaker    PVC (premature ventricular contraction)    RBBB    Shortness of breath     Current Outpatient Medications  Medication Sig Dispense Refill   acetaminophen (TYLENOL) 500 MG tablet Take 1,000 mg by mouth every 6 (six) hours as needed (for pain or headaches).     aspirin EC 81 MG tablet Take 81 mg by mouth in the morning.     Cholecalciferol (VITAMIN D3) 125 MCG (5000 UT) CAPS Take 5,000 Units by mouth daily with breakfast.     citalopram (CELEXA) 10 MG tablet Take 1 tablet by mouth once daily 90 tablet 3   Coenzyme Q10 (COQ10) 200 MG CAPS Take 200 mg by mouth in the morning.     Famotidine (PEPCID PO) Take 1 tablet by mouth every morning.     levothyroxine (SYNTHROID) 175 MCG tablet 1 tablet daily every morning except half tablet on Sundays 90 tablet 2   melatonin 5 MG TABS Take 5 mg by mouth at bedtime as needed (for sleep).     mexiletine (MEXITIL) 150 MG capsule Take 1 capsule by mouth twice daily (Patient taking differently: Take 150 mg by mouth 2 (two) times daily.) 60 capsule 6   midodrine (PROAMATINE) 5 MG tablet Take 1 tablet (5 mg total) by mouth 2 (two) times daily as needed. Take 5 mg if doppler is 90 or less. 60 tablet 5   pantoprazole (PROTONIX) 40 MG tablet Take 1 tablet by mouth once daily (Patient taking differently: Take 40 mg by mouth daily before breakfast.) 90 tablet 3   Probiotic Product (PROBIOTIC PO) Take 1 capsule by mouth in the morning.     rosuvastatin (CRESTOR) 20 MG tablet Take 1 tablet (20 mg total) by mouth daily. (Patient taking differently: Take 20 mg by mouth every morning.) 90 tablet 3   sotalol (BETAPACE) 80 MG tablet Take 1 tablet (80 mg total) by mouth at bedtime. 30 tablet 1   traMADol  (ULTRAM) 50 MG tablet Take 1-2 tablets (50-100 mg total) by mouth every 6 (six) hours as needed for moderate pain. 40 tablet 5   traZODone (DESYREL) 50 MG tablet TAKE 1 TABLET BY MOUTH AT BEDTIME (Patient taking differently: Take 50 mg by mouth at bedtime.) 30 tablet 6   warfarin (COUMADIN) 5 MG tablet Take 5 mg (1 tablet) every day as directed by HF Clinic 135 tablet 11   doxazosin (CARDURA) 2 MG tablet Take 0.5 tablets (1 mg total) by mouth 2 (two) times daily. Take 0.5 tablet (1 mg total) by mouth 2 (two) times daily if Doppler BP 120 or greater. Check BP in the morning and evening. (Patient not taking: Reported  on 04/06/2022) 30 tablet 5   fludrocortisone (FLORINEF) 0.1 MG tablet Take 1 tablet (0.1 mg total) by mouth daily. (Patient not taking: Reported on 05/19/2022) 30 tablet 3   furosemide (LASIX) 20 MG tablet Take 1 tablet (20 mg total) by mouth daily as needed (Take 20 mg as daily as needed for wt > 153 lbs). Reported on 01/14/2016 (Patient not taking: Reported on 02/23/2022) 30 tablet 6   No current facility-administered medications for this encounter.   Facility-Administered Medications Ordered in Other Encounters  Medication Dose Route Frequency Provider Last Rate Last Admin   etomidate (AMIDATE) injection    Anesthesia Intra-op Myna Bright, CRNA   20 mg at 02/08/14 0915   rocuronium [facility]) injection    Anesthesia Intra-op Myna Bright, CRNA   50 mg at 02/08/14 0932   succinylcholine (ANECTINE) injection    Anesthesia Intra-op Myna Bright, CRNA   100 mg at 02/08/14 0915   Ace inhibitors, Amiodarone, Diovan [valsartan], Lipitor [atorvastatin calcium], Jardiance [empagliflozin], and Norvasc [amlodipine]  Review of systems complete and found to be negative unless listed in HPI.     Vitals:   07/20/22 1114 07/20/22 1115  BP: (!) 122/0 (!) 131/96  Pulse: 82   SpO2: 100%    Wt Readings from Last 3 Encounters:  07/20/22 73.7 kg (162 lb 6.4 oz)  05/19/22 71.7 kg (158  lb)  04/06/22 71.2 kg (157 lb)   Vital Signs:   Doppler Pressure: 122 Automatc BP: 131/96 (112) HR: 82   SPO2: 100% on RA   Weight: 162.4 lb w/ eqt Last weight: 158 lb w/eqt   Physical Exam: General:  NAD.  HEENT: normal  Neck: supple. JVP 7-8 with prominent v-waves  Carotids 2+ bilat; no bruits. No lymphadenopathy or thryomegaly appreciated. Cor: LVAD hum.  Lungs: Clear. Abdomen: obese soft, nontender, non-distended. No hepatosplenomegaly. No bruits or masses. Good bowel sounds. Driveline site clean. Anchor in place.  Extremities: no cyanosis, clubbing, rash. Warm no edema  Neuro: alert & oriented x 3. No focal deficits. Moves all 4 without problem    ASSESSMENT AND PLAN:   1) HTN complicated by orthostatic hypotension - Much improved with close BP monitoring  with prn midodrine and doxazosin  2) Chronic systolic HF: ICM, EF 33-54% s/p LVAD HM II implant 08/2013 for DT.   - Echo 3/18 EF 10-15% mild AI. Mod RV dysfunction. LVAD cannula OK - has struggled with RV failure however RHC 11/22 looked good (despite evidence of RVF on LE dopplers showing reflux - Improved NYHA II - Volume status ok. Avoid volume depletion  -Failed Jardiance due to volume depletion - He has not tolerated ACE-I or amlodipine or b-blocker in the past.  3) LVAD: HMII 08/2013 for DT. - VAD interrogated personally. Parameters stable. - Continue aspirin and coumadin.  - LDH 254 - INR 2.7 Goal 2.-2.5 Discussed dosing with PharmD personally. - DL site ok - MAPs ok  4) VT/Frequent PVCs - now s/p ICD gen change.  - Off amio due to cerebellar side effects.  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 - VT  06/16/21 with  DC-CV x 1 - Recurrent VT 12/28/21 after stopping sotalol. Sotalol restarted. QT ok.  - PVCs improved on mexilitene. Continue mexilitene 150 bid - Keep K > 4.0 Mg > 2.0 - Continue to f/u with EP. - Continue sotalol and mexilitene. QT interval ok  - No recent VT - ECG today NSR 87 1AVB 27ms RBBB 134md QTC  562ms  5)  Atrial flutter, recurrent - patient paced out of AFL in 6/22. Atrial therapies enabled  - continue sotalol (on for VT). QTs slightly prolonged but stable - In SR today  6) Chronic anticoagulation: INR goal 2.0-3.0.    - INR 2.7 today. Discussed dosing with PharmD personally. - Continue ASA 81 mg for LVAD + warfarin.   7) Hypothyroidism:  - Followed by Dr Dwyane Dee. Stable.No change   8) Hyperlipidemia:  - Continue Crestor. Zetia stopped due to cost.  - No change.   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 Over half that time spent discussing above.    Glori Bickers, MD  3:36 PM

## 2022-07-20 NOTE — Addendum Note (Signed)
Encounter addended by: Jolaine Artist, MD on: 07/20/2022 3:41 PM  Actions taken: Clinical Note Signed

## 2022-08-05 ENCOUNTER — Telehealth (HOSPITAL_COMMUNITY): Payer: Self-pay | Admitting: Unknown Physician Specialty

## 2022-08-05 NOTE — Telephone Encounter (Signed)
Received call from pts wife stating that Johnny Oneill has had nosebleed on and off today. Pt states that it is just a trickle of blood and will stop after he puts a kleenex in his nose. Pt informed that he needs to hold pressure for 30 minutes without putting a kleenex in his nose. Pt instructed to use saline spray during the cold months to keep his nares moist. Pt instructed that if his nose starts to bleed profusely and he cannot get it to stop he will need to go to the ED to have his nose packed.  Tanda Rockers RN, BSN VAD Coordinator 24/7 Pager [phone]

## 2022-08-06 ENCOUNTER — Other Ambulatory Visit (HOSPITAL_COMMUNITY): Payer: Self-pay

## 2022-08-06 DIAGNOSIS — Z95811 Presence of heart assist device: Secondary | ICD-10-CM

## 2022-08-06 DIAGNOSIS — Z7901 Long term (current) use of anticoagulants: Secondary | ICD-10-CM

## 2022-08-08 ENCOUNTER — Emergency Department (HOSPITAL_COMMUNITY)
Admission: EM | Admit: 2022-08-08 | Discharge: 2022-08-09 | Disposition: A | Discharge status: Home / Self Care | Payer: Medicare Other | Attending: Emergency Medicine | Admitting: Emergency Medicine

## 2022-08-08 ENCOUNTER — Other Ambulatory Visit: Payer: Self-pay

## 2022-08-08 ENCOUNTER — Emergency Department (HOSPITAL_COMMUNITY): Payer: Medicare Other

## 2022-08-08 DIAGNOSIS — R509 Fever, unspecified: Secondary | ICD-10-CM | POA: Diagnosis not present

## 2022-08-08 DIAGNOSIS — Z7901 Long term (current) use of anticoagulants: Secondary | ICD-10-CM | POA: Diagnosis not present

## 2022-08-08 DIAGNOSIS — I509 Heart failure, unspecified: Secondary | ICD-10-CM | POA: Insufficient documentation

## 2022-08-08 DIAGNOSIS — U071 COVID-19: Secondary | ICD-10-CM

## 2022-08-08 DIAGNOSIS — Z7982 Long term (current) use of aspirin: Secondary | ICD-10-CM | POA: Insufficient documentation

## 2022-08-08 DIAGNOSIS — Z95811 Presence of heart assist device: Secondary | ICD-10-CM

## 2022-08-08 DIAGNOSIS — R059 Cough, unspecified: Secondary | ICD-10-CM | POA: Diagnosis not present

## 2022-08-08 DIAGNOSIS — I10 Essential (primary) hypertension: Secondary | ICD-10-CM | POA: Diagnosis not present

## 2022-08-08 LAB — COMPREHENSIVE METABOLIC PANEL
ALT: 10 U/L (ref 0–44)
AST: 19 U/L (ref 15–41)
Albumin: 3.6 g/dL (ref 3.5–5.0)
Alkaline Phosphatase: 67 U/L (ref 38–126)
Anion gap: 9 (ref 5–15)
BUN: 25 mg/dL — ABNORMAL HIGH (ref 8–23)
CO2: 21 mmol/L — ABNORMAL LOW (ref 22–32)
Calcium: 8.8 mg/dL — ABNORMAL LOW (ref 8.9–10.3)
Chloride: 104 mmol/L (ref 98–111)
Creatinine, Ser: 2.21 mg/dL — ABNORMAL HIGH (ref 0.61–1.24)
GFR, Estimated: 30 mL/min — ABNORMAL LOW (ref >60)
Glucose, Bld: 147 mg/dL — ABNORMAL HIGH (ref 70–99)
Potassium: 3.8 mmol/L (ref 3.5–5.1)
Sodium: 134 mmol/L — ABNORMAL LOW (ref 135–145)
Total Bilirubin: 1.1 mg/dL (ref 0.3–1.2)
Total Protein: 6.8 g/dL (ref 6.5–8.1)

## 2022-08-08 LAB — RESPIRATORY PANEL BY PCR

## 2022-08-08 LAB — CBC WITH DIFFERENTIAL/PLATELET
Abs Immature Granulocytes: 0.03 10*3/uL (ref 0.00–0.07)
Basophils Absolute: 0 10*3/uL (ref 0.0–0.1)
Basophils Relative: 0 %
Eosinophils Absolute: 0.1 10*3/uL (ref 0.0–0.5)
Eosinophils Relative: 1 %
HCT: 33.6 % — ABNORMAL LOW (ref 39.0–52.0)
Hemoglobin: 11.4 g/dL — ABNORMAL LOW (ref 13.0–17.0)
Immature Granulocytes: 0 %
Lymphocytes Relative: 6 %
Lymphs Abs: 0.5 10*3/uL — ABNORMAL LOW (ref 0.7–4.0)
MCH: 30.9 pg (ref 26.0–34.0)
MCHC: 33.9 g/dL (ref 30.0–36.0)
MCV: 91.1 fL (ref 80.0–100.0)
Monocytes Absolute: 0.6 10*3/uL (ref 0.1–1.0)
Monocytes Relative: 7 %
Neutro Abs: 7.6 10*3/uL (ref 1.7–7.7)
Neutrophils Relative %: 86 %
Platelets: 136 10*3/uL — ABNORMAL LOW (ref 150–400)
RBC: 3.69 MIL/uL — ABNORMAL LOW (ref 4.22–5.81)
RDW: 15.3 % (ref 11.5–15.5)
WBC: 8.8 10*3/uL (ref 4.0–10.5)
nRBC: 0 % (ref 0.0–0.2)

## 2022-08-08 LAB — URINALYSIS, ROUTINE W REFLEX MICROSCOPIC
Bacteria, UA: NONE SEEN
Bilirubin Urine: NEGATIVE
Glucose, UA: NEGATIVE mg/dL
Ketones, ur: NEGATIVE mg/dL
Leukocytes,Ua: NEGATIVE
Nitrite: NEGATIVE
Protein, ur: NEGATIVE mg/dL
Specific Gravity, Urine: 1.018 (ref 1.005–1.030)
pH: 5 (ref 5.0–8.0)

## 2022-08-08 LAB — LACTIC ACID, PLASMA: Lactic Acid, Venous: 1.2 mmol/L (ref 0.5–1.9)

## 2022-08-08 LAB — RESP PANEL BY RT-PCR (FLU A&B, COVID) ARPGX2
Influenza A by PCR: NEGATIVE
Influenza B by PCR: NEGATIVE
SARS Coronavirus 2 by RT PCR: POSITIVE — AB

## 2022-08-08 LAB — APTT: aPTT: 37 seconds — ABNORMAL HIGH (ref 24–36)

## 2022-08-08 LAB — PROTIME-INR
INR: 2.8 — ABNORMAL HIGH (ref 0.8–1.2)
Prothrombin Time: 28.9 seconds — ABNORMAL HIGH (ref 11.4–15.2)

## 2022-08-08 LAB — LACTATE DEHYDROGENASE: LDH: 185 U/L (ref 98–192)

## 2022-08-08 MED ORDER — SODIUM CHLORIDE 0.9 % IV SOLN
2.0000 g | INTRAVENOUS | Status: DC
  Administered 2022-08-08: 2 g via INTRAVENOUS
  Filled 2022-08-08: qty 20

## 2022-08-08 MED ORDER — LACTATED RINGERS IV BOLUS
1000.0000 mL | Freq: Once | INTRAVENOUS | Status: AC
  Administered 2022-08-08: 1000 mL via INTRAVENOUS

## 2022-08-08 MED ORDER — SODIUM CHLORIDE 0.9 % IV SOLN
500.0000 mg | INTRAVENOUS | Status: DC
  Administered 2022-08-08: 500 mg via INTRAVENOUS
  Filled 2022-08-08: qty 5

## 2022-08-08 MED ORDER — MOLNUPIRAVIR EUA 200MG CAPSULE
4.0000 | ORAL_CAPSULE | Freq: Two times a day (BID) | ORAL | Status: DC
  Administered 2022-08-08: 800 mg via ORAL
  Filled 2022-08-08: qty 4

## 2022-08-08 NOTE — ED Provider Notes (Signed)
[facility] EMERGENCY DEPARTMENT Provider Note   CSN: [id number] Arrival date & time: 08/08/22  2111     History {Add pertinent medical, surgical, social history, OB history to HPI:1} Chief Complaint  Patient presents with   Fever    Johnny Oneill is a 81 y.o. male with past medical history significant for heart failure s/p LVAD presenting with concerns for fever.  Patient reports that he has had cough and congestion since yesterday.  Fever noted today at home, reportedly measured to 103 F.  Patient's wife gave him 1 g of Tylenol.  He denies chest pain, shortness of breath, nausea, vomiting, diarrhea.  He denies dysuria, frequency, urgency.     Home Medications Prior to Admission medications   Medication Sig Start Date End Date Taking? Authorizing Provider  acetaminophen (TYLENOL) 500 MG tablet Take 1,000 mg by mouth every 6 (six) hours as needed (for pain or headaches).    [provider]  aspirin EC 81 MG tablet Take 81 mg by mouth in the morning.    [provider]  Cholecalciferol (VITAMIN D3) 125 MCG (5000 UT) CAPS Take 5,000 Units by mouth daily with breakfast.    [provider]  citalopram (CELEXA) 10 MG tablet Take 1 tablet by mouth once daily 04/06/22   Bensimhon, Shaune Pascal, MD  Coenzyme Q10 (COQ10) 200 MG CAPS Take 200 mg by mouth in the morning.    [provider]  doxazosin (CARDURA) 2 MG tablet Take 0.5 tablets (1 mg total) by mouth 2 (two) times daily. Take 0.5 tablet (1 mg total) by mouth 2 (two) times daily if Doppler BP 120 or greater. Check BP in the morning and evening. Patient not taking: Reported on 04/06/2022 02/23/22   Bensimhon, Shaune Pascal, MD  Famotidine (PEPCID PO) Take 1 tablet by mouth every morning.    [provider]  fludrocortisone (FLORINEF) 0.1 MG tablet Take 1 tablet (0.1 mg total) by mouth daily. Patient not taking: Reported on 05/19/2022 04/06/22   Bensimhon, Shaune Pascal, MD  furosemide (LASIX) 20  MG tablet Take 1 tablet (20 mg total) by mouth daily as needed (Take 20 mg as daily as needed for wt > 153 lbs). Reported on 01/14/2016 Patient not taking: Reported on 02/23/2022 02/21/21   Shirley Friar, PA-C  levothyroxine (SYNTHROID) 175 MCG tablet 1 tablet daily every morning except half tablet on Sundays 06/10/22   Elayne Snare, MD  melatonin 5 MG TABS Take 5 mg by mouth at bedtime as needed (for sleep).    [provider]  mexiletine (MEXITIL) 150 MG capsule Take 1 capsule by mouth twice daily Patient taking differently: Take 150 mg by mouth 2 (two) times daily. 01/27/22   Bensimhon, Shaune Pascal, MD  midodrine (PROAMATINE) 5 MG tablet Take 1 tablet (5 mg total) by mouth 2 (two) times daily as needed. Take 5 mg if doppler is 90 or less. 02/23/22   Bensimhon, Shaune Pascal, MD  pantoprazole (PROTONIX) 40 MG tablet Take 1 tablet by mouth once daily Patient taking differently: Take 40 mg by mouth daily before breakfast. 08/11/21   Bensimhon, Shaune Pascal, MD  Probiotic Product (PROBIOTIC PO) Take 1 capsule by mouth in the morning.    [provider]  rosuvastatin (CRESTOR) 20 MG tablet Take 1 tablet (20 mg total) by mouth daily. Patient taking differently: Take 20 mg by mouth every morning. 11/26/21   Bensimhon, Shaune Pascal, MD  sotalol (BETAPACE) 80 MG tablet Take 1 tablet (  80 mg total) by mouth at bedtime. 04/06/22   Bensimhon, Shaune Pascal, MD  traMADol (ULTRAM) 50 MG tablet Take 1-2 tablets (50-100 mg total) by mouth every 6 (six) hours as needed for moderate pain. 12/12/21   Clegg, Amy D, NP  traZODone (DESYREL) 50 MG tablet TAKE 1 TABLET BY MOUTH AT BEDTIME Patient taking differently: Take 50 mg by mouth at bedtime. 02/03/22   Bensimhon, Shaune Pascal, MD  warfarin (COUMADIN) 5 MG tablet Take 5 mg (1 tablet) every day as directed by HF Clinic 02/16/22   Darrick Grinder D, NP      Allergies    Ace inhibitors, Amiodarone, Diovan [valsartan], Lipitor [atorvastatin calcium], Norvasc [amlodipine], and  Jardiance [empagliflozin]    Review of Systems   Review of Systems  Constitutional:  Positive for fever.    Physical Exam Updated Vital Signs BP (!) 107/93   Pulse 80   Temp 99.5 F (37.5 C) (Oral)   Resp 20   Ht 5\' 7"  (1.702 m)   Wt 72.6 kg   SpO2 95%   BMI 25.06 kg/m  Physical Exam Constitutional:      General: He is not in acute distress.    Appearance: He is not toxic-appearing.  HENT:     Head: Normocephalic and atraumatic.     Nose: Congestion present.     Mouth/Throat:     Mouth: Mucous membranes are moist.     Pharynx: Oropharynx is clear.  Eyes:     Conjunctiva/sclera: Conjunctivae normal.  Cardiovascular:     Rate and Rhythm: Normal rate and regular rhythm.     Comments: LVAD hum appreciated Pulmonary:     Effort: Pulmonary effort is normal.  Abdominal:     General: There is no distension.     Tenderness: There is no abdominal tenderness. There is no guarding or rebound.  Musculoskeletal:     Right lower leg: No edema.     Left lower leg: No edema.  Skin:    General: Skin is warm and dry.  Neurological:     Mental Status: He is alert and oriented to person, place, and time.     ED Results / Procedures / Treatments   Labs (all labs ordered are listed, but only abnormal results are displayed) Labs Reviewed  CBC WITH DIFFERENTIAL/PLATELET - Abnormal; Notable for the following components:      Result Value   RBC 3.69 (*)    Hemoglobin 11.4 (*)    HCT 33.6 (*)    Platelets 136 (*)    Lymphs Abs 0.5 (*)    All other components within normal limits  RESPIRATORY PANEL BY PCR  RESP PANEL BY RT-PCR (FLU A&B, COVID) [id number]  CULTURE, BLOOD (ROUTINE X 2)  CULTURE, BLOOD (ROUTINE X 2)  URINE CULTURE  COMPREHENSIVE METABOLIC PANEL  PROTIME-INR  LACTATE DEHYDROGENASE  LACTIC ACID, PLASMA  LACTIC ACID, PLASMA  APTT  URINALYSIS, ROUTINE W REFLEX MICROSCOPIC    EKG EKG Interpretation  Date/Time:  Saturday August 08 2022 21:19:52 EST Ventricular  Rate:  80 PR Interval:  100 QRS Duration: 217 QT Interval:  445 QTC Calculation: 514 R Axis:   -80 Text Interpretation: Sinus rhythm Short PR interval Nonspecific IVCD with LAD LVH with secondary repolarization abnormality Inferior infarct, acute Anterior infarct, acute (LAD) Lateral leads are also involved Artifact in lead(s) I II III aVR aVL aVF V1 V2 V4 V5 V6 since last tracing no significant change Confirmed by Noemi Chapel [id number]) on 08/08/2022 9:22:51 PM  Radiology DG Chest Port 1 View  Result Date: 08/08/2022 CLINICAL DATA:  Cough, fever. EXAM: PORTABLE CHEST 1 VIEW COMPARISON:  Chest radiograph dated February 09, 2022 FINDINGS: The heart is enlarged. Evidence of prior sternotomy and pacemaker leads in the right atrium and right ventricle, unchanged. Left ventricular assist device is also unchanged. Lungs are clear without evidence of focal consolidation or pleural effusion. Evaluation of left lung base is somewhat limited due to overlying hardware. IMPRESSION: Stable cardiomegaly. No active disease. Evaluation of the left lung base is somewhat limited due to overlying hardware. Electronically Signed   By: Keane Police D.O.   On: 08/08/2022 21:34    Procedures Procedures  {Document cardiac monitor, telemetry assessment procedure when appropriate:1}  Medications Ordered in ED Medications  cefTRIAXone (ROCEPHIN) 2 g in sodium chloride 0.9 % 100 mL IVPB (2 g Intravenous New Bag/Given 08/08/22 2142)  azithromycin (ZITHROMAX) 500 mg in sodium chloride 0.9 % 250 mL IVPB (500 mg Intravenous New Bag/Given 08/08/22 2150)    ED Course/ Medical Decision Making/ A&P                           Medical Decision Making Amount and/or Complexity of Data Reviewed Labs: ordered. Decision-making details documented in ED Course. Radiology: ordered and independent interpretation performed. Decision-making details documented in ED Course. ECG/medicine tests: ordered and independent interpretation performed.  Decision-making details documented in ED Course.   Patient presents as above.  Patient is febrile in the setting of LVAD.  He does endorse cough and congestion.  Labs including blood cultures ordered.  Due to fever, productive cough, community-acquired pneumonia labs ordered.  Chest x-ray showed no obvious focal consolidation or opacification, no pneumothorax.  I personally reviewed the chest x-ray and agree with the radiology interpretation.  I discussed the patient with the LVAD coordinator.  I updated her on the patient's current LVAD readings.  Labs ordered per her recommendation.  After we have some of the labs back, I will reach out to LVAD attending.  CBC showed no leukocytosis, hemoglobin 11.4, platelets 136, lymphocytes slightly low at 0.5, differential otherwise within normal limits.  {Document critical care time when appropriate:1} {Document review of labs and clinical decision tools ie heart score, Chads2Vasc2 etc:1}  {Document your independent review of radiology images, and any outside records:1} {Document your discussion with family members, caretakers, and with consultants:1} {Document social determinants of health affecting pt's care:1} {Document your decision making why or why not admission, treatments were needed:1} Final Clinical Impression(s) / ED Diagnoses Final diagnoses:  None    Rx / DC Orders ED Discharge Orders     None

## 2022-08-08 NOTE — Discharge Instructions (Signed)
You did test positive for COVID-19.  We have given you a medicine called molnupiravir.  You will take 4 caps of this twice a day for 5 days.  We gave you your first dose here.  This is an antiviral medication.  We talked to your LVAD doctor, Dr. Haroldine Laws.  He is aware of your COVID-19 infection and the plan for molnupiravir.  You can take Tylenol as needed for fever, discomfort.  Please make sure that you do not take more than 4000 mg of Tylenol in a 24-hour period  If you have shortness of breath, difficulty breathing, chest pain, return to the emergency department immediately.  Please follow-up with your primary care provider.

## 2022-08-08 NOTE — Sepsis Progress Note (Signed)
Elink following for Sepsis Protocol 

## 2022-08-08 NOTE — ED Triage Notes (Addendum)
BIB EMS for a low grade fever all day. No appetite. Wife checked temp @ 1800 103F, took tylenol 1000mg . Cough x 1 day. LVAD coordinator advised them to come to ER  EMS: 122/88, 101.16F, 80s HR

## 2022-08-08 NOTE — ED Notes (Signed)
ED Provider at bedside. 

## 2022-08-08 NOTE — Progress Notes (Addendum)
LVAD Coordinator ED Encounter  Johnny Oneill a 81 y.o. male that presented to [facility] ER today due to fever, cough, wheezing, weakness, and overall feeling poorly. He has a past medical history  has a past medical history of Anxiety, Automatic implantable cardioverter-defibrillator in situ, CAD (coronary artery disease), CHF (congestive heart failure) ([place]), Hypercholesteremia, Hypertension, Hypothyroidism, ICD (implantable cardiac defibrillator) in place, Inferior MI ([place]) ("? date"; 2009), Ischemic cardiomyopathy, Liver disease, LVAD (left ventricular assist device) present ([place]), Osteoarthritis, Pacemaker, PVC (premature ventricular contraction), RBBB, and Shortness of breath.[name]   LVAD is a HM2 and was implanted on 09/18/13 by Dr Prescott Gum for destination therapy.   Received call from patient's wife this evening reporting pt had been sleeping most of the day, had a productive cough, and temp 99.0. She had been treating with OTC medications and pushing fluids. She did not feel at that time pt needed to go to ER and planned to take him to urgent care in the morning. Received 2nd page 2 hours later reporting pt had spiked temp to 103.4 and was increasingly lethargic. Advised to bring pt to ER for evaluation. He was unable to stand to walk to the car, so EMS was called.   Discussed pt with Dr Ernie Hew. Plan for labs, blood cultures, resp panel, UA, and chest xray. Provided with Dr Bensimhon's contact information. Provider aware that pt may only be admitted by Dr Haroldine Laws to 2C/2H.   Vital signs: Temp: 99.5 HR: 80 Doppler MAP:  Automated BP: 91/74 (81) O2 Sat: 94%  LVAD interrogation reveals:  Speed: 9800 Flow: 4.3 Power: 5.8 w  PI: 5.7  Alarms: none Events: multiple PI events  Drive Line: Maintained weekly by pt's wife Johnny Oneill.   Significant Events with LVAD:   Updated VAD Providers (Dr Haroldine Laws) about the above. No LVAD issues and pump is functioning as expected. Able to independently manage  LVAD equipment. No LVAD needs at this time.   Emerson Monte RN [place] Coordinator  Office: [phone]  24/7 Pager: [phone]

## 2022-08-10 ENCOUNTER — Ambulatory Visit (HOSPITAL_COMMUNITY): Payer: Self-pay | Admitting: Pharmacist

## 2022-08-10 LAB — URINE CULTURE: Culture: NO GROWTH

## 2022-08-11 ENCOUNTER — Other Ambulatory Visit (HOSPITAL_COMMUNITY): Payer: Medicare Other

## 2022-08-13 LAB — CULTURE, BLOOD (ROUTINE X 2)
Culture: NO GROWTH
Culture: NO GROWTH
Special Requests: ADEQUATE

## 2022-08-25 ENCOUNTER — Other Ambulatory Visit: Payer: Self-pay

## 2022-08-25 ENCOUNTER — Other Ambulatory Visit (HOSPITAL_COMMUNITY): Payer: Self-pay

## 2022-08-25 MED ORDER — PROMETHAZINE HCL 12.5 MG PO TABS: 12.5000 mg | ORAL_TABLET | Freq: Four times a day (QID) | ORAL | 0 refills | Status: DC | PRN

## 2022-08-25 NOTE — Progress Notes (Signed)
Pt's wife called and stated patient has been having nausea, vomiting and diarrhea for 2 days.Pt had taken some old phenergan and is requesting a refill. VAD Coordinator asked pt's wife if she felt like he needed to see his PCP and she stated pt is managing okay with medication for now. Educated patient on importance of staying hydrated to prevent low flows on VAD. Dr. Haroldine Laws made aware. Refill sent.  Bobbye Morton RN, BSN VAD Coordinator 24/7 Pager [phone]

## 2022-08-27 ENCOUNTER — Other Ambulatory Visit (HOSPITAL_COMMUNITY): Payer: Self-pay

## 2022-08-27 ENCOUNTER — Telehealth (HOSPITAL_COMMUNITY): Payer: Self-pay

## 2022-08-27 NOTE — Telephone Encounter (Signed)
Advanced Heart Failure Patient Advocate Encounter  Prior authorization is required for Promethazine. PA submitted and APPROVED on 08/27/22.  Key [id number] Effective: 07/28/22 - 08/27/23  Test billing returns $5.36 copay (30 tablets 8 day supply) This medication can be filled with [facility] pharmacy locations at a price of $5 for 30 days without being billed to insurance. Patient is also welcome to check with retail locations for discounted prices.  Clista Bernhardt, CPhT Rx Patient Advocate Phone: [phone]

## 2022-08-31 ENCOUNTER — Other Ambulatory Visit (HOSPITAL_COMMUNITY): Payer: Self-pay | Admitting: Internal Medicine

## 2022-08-31 DIAGNOSIS — K219 Gastro-esophageal reflux disease without esophagitis: Secondary | ICD-10-CM

## 2022-08-31 DIAGNOSIS — I493 Ventricular premature depolarization: Secondary | ICD-10-CM

## 2022-08-31 DIAGNOSIS — Z95811 Presence of heart assist device: Secondary | ICD-10-CM

## 2022-08-31 DIAGNOSIS — I472 Ventricular tachycardia, unspecified: Secondary | ICD-10-CM

## 2022-09-02 ENCOUNTER — Ambulatory Visit: Payer: Medicare Other | Attending: Cardiology | Admitting: Cardiology

## 2022-09-02 ENCOUNTER — Encounter: Payer: Self-pay | Admitting: Cardiology

## 2022-09-02 VITALS — BP 126/66 | HR 86 | Ht 67.0 in | Wt 163.0 lb

## 2022-09-02 DIAGNOSIS — I5022 Chronic systolic (congestive) heart failure: Secondary | ICD-10-CM | POA: Insufficient documentation

## 2022-09-02 DIAGNOSIS — I472 Ventricular tachycardia, unspecified: Secondary | ICD-10-CM | POA: Insufficient documentation

## 2022-09-02 NOTE — Progress Notes (Signed)
Electrophysiology Office Note   Date:  09/02/2022   ID:  Johnny Oneill, DOB 02/23/1941, MRN [id number]  PCP:  Lavone Orn, MD  Cardiologist:  [place] Primary Electrophysiologist:  Ayushi Pla Meredith Leeds, MD    Chief Complaint: CHF   History of Present Illness: Johnny Oneill is a 82 y.o. male who is being seen today for the evaluation of CHF at the request of Bensimhon, Shaune Pascal, MD. Presenting today for electrophysiology evaluation.  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  He was admitted to the hospital October 2020 after an episode of syncope.  He was found to have VT and had an ICD generator change 04/20/2019.  This is complicated by device site hematoma.  He presented emergency room with an episode of syncope 06/09/2020 and was found to be in ventricular tachycardia.  He is currently on both sotalol and mexiletine.  Today, denies symptoms of palpitations, chest pain, shortness of breath, orthopnea, PND, lower extremity edema, claudication, dizziness, presyncope, syncope, bleeding, or neurologic sequela. The patient is tolerating medications without difficulties.     Past Medical History:  Diagnosis Date   Anxiety    Automatic implantable cardioverter-defibrillator in situ    CAD (coronary artery disease)    S/P stenting of LCX in 2004;  s/p Lat MI 2009 with occlusion of the LCX - treated with Promus stenting. Has total occlusion of the RCA.    CHF (congestive heart failure) (HCC)    Hypercholesteremia    Hypertension    Hypothyroidism    ICD (implantable cardiac defibrillator) in place    PROPHYLACTIC      medtronic   Inferior MI ([place]) "? date"; 2009   Ischemic cardiomyopathy    a. 10/09 Echo: Sev LV Dysfxn, inf/lat AK, Mod MR.   Liver disease    LVAD (left ventricular assist device) present ([place])    Osteoarthritis    a. s/p R TKR   Pacemaker     PVC (premature ventricular contraction)    RBBB    Shortness of breath    Past Surgical History:  Procedure Laterality Date   CARPAL TUNNEL RELEASE Right 05/29/2016   Procedure: CARPAL TUNNEL RELEASE;  Surgeon: Earlie Server, MD;  Location: [place];  Service: Orthopedics;  Laterality: Right;   CORONARY ANGIOPLASTY WITH STENT PLACEMENT  2004   CORONARY ANGIOPLASTY WITH STENT PLACEMENT  07/2008   DEFIBRILLATOR  2009   ESOPHAGOGASTRODUODENOSCOPY N/A 09/15/2013   Procedure: ESOPHAGOGASTRODUODENOSCOPY (EGD);  Surgeon: Ladene Artist, MD;  Location: [facility] ENDOSCOPY;  Service: Endoscopy;  Laterality: N/A;  bedside   HEMATOMA EVACUATION Right 11/06/2013   Procedure: EVACUATION HEMATOMA;  Surgeon: Gaye Pollack, MD;  Location: [place];  Service: Cardiothoracic;  Laterality: Right;   ICD GENERATOR CHANGEOUT N/A 06/28/2019   Procedure: ICD GENERATOR CHANGEOUT;  Surgeon: Constance Haw, MD;  Location: [place] CV LAB;  Service: Cardiovascular;  Laterality: N/A;   INSERT / REPLACE / REMOVE PACEMAKER  2009   INSERTION OF IMPLANTABLE LEFT VENTRICULAR ASSIST DEVICE N/A 09/18/2013   Procedure: INSERTION OF IMPLANTABLE LEFT VENTRICULAR ASSIST DEVICE;  Surgeon: Ivin Poot, MD;  Location: [place];  Service: Open Heart Surgery;  Laterality: N/A;  CIRC ARREST  NITRIC OXIDE   INTRA-AORTIC BALLOON PUMP INSERTION N/A 09/14/2013   Procedure: INTRA-AORTIC BALLOON PUMP INSERTION;  Surgeon: Jolaine Artist, MD;  Location[facility] CATH LAB;  Service: Cardiovascular;  Laterality: N/A;   INTRAOPERATIVE TRANSESOPHAGEAL ECHOCARDIOGRAM N/A 09/18/2013   Procedure: INTRAOPERATIVE TRANSESOPHAGEAL ECHOCARDIOGRAM;  Surgeon: Ivin Poot, MD;  Location: [place];  Service: Open Heart Surgery;  Laterality: N/A;   KNEE ARTHROSCOPY     bilaterally   KNEE ARTHROSCOPY W/ PARTIAL MEDIAL MENISCECTOMY  12/2002   left   LEFT VENTRICULAR ASSIST DEVICE     RIGHT HEART CATH N/A 07/28/2021   Procedure: RIGHT HEART CATH;  Surgeon:  Jolaine Artist, MD;  Location: [place] CV LAB;  Service: Cardiovascular;  Laterality: N/A;   RIGHT HEART CATH N/A 02/12/2022   Procedure: RIGHT HEART CATH;  Surgeon: Jolaine Artist, MD;  Location: [place] CV LAB;  Service: Cardiovascular;  Laterality: N/A;   RIGHT HEART CATHETERIZATION N/A 08/25/2013   Procedure: RIGHT HEART CATH;  Surgeon: Jolaine Artist, MD;  Location: [facility] CATH LAB;  Service: Cardiovascular;  Laterality: N/A;   RIGHT HEART CATHETERIZATION N/A 09/13/2013   Procedure: RIGHT HEART CATH;  Surgeon: Jolaine Artist, MD;  Location: [facility] CATH LAB;  Service: Cardiovascular;  Laterality: N/A;   RIGHT HEART CATHETERIZATION N/A 02/08/2014   Procedure: RIGHT HEART CATH;  Surgeon: Jolaine Artist, MD;  Location: [facility] CATH LAB;  Service: Cardiovascular;  Laterality: N/A;   RIGHT HEART CATHETERIZATION N/A 02/12/2014   Procedure: RIGHT HEART CATH;  Surgeon: Jolaine Artist, MD;  Location: [facility] CATH LAB;  Service: Cardiovascular;  Laterality: N/A;   TOTAL KNEE ARTHROPLASTY  11/27/11   left   TOTAL KNEE ARTHROPLASTY  11/27/2011   Procedure: TOTAL KNEE ARTHROPLASTY;  Surgeon: Yvette Rack., MD;  Location: [place];  Service: Orthopedics;  Laterality: Left;  left total knee arthroplasty   TOTAL KNEE ARTHROPLASTY Right 12/03/2021   Procedure: TOTAL KNEE ARTHROPLASTY;  Surgeon: Earlie Server, MD;  Location: [place];  Service: Orthopedics;  Laterality: Right;   VIDEO ASSISTED THORACOSCOPY Right 11/06/2013   Procedure: VIDEO ASSISTED THORACOSCOPY;  Surgeon: Gaye Pollack, MD;  Location: [facility] OR;  Service: Cardiothoracic;  Laterality: Right;     Current Outpatient Medications  Medication Sig Dispense Refill   acetaminophen (TYLENOL) 500 MG tablet Take 1,000 mg by mouth every 6 (six) hours as needed (for pain or headaches).     aspirin EC 81 MG tablet Take 81 mg by mouth in the morning.     Cholecalciferol (VITAMIN D3) 125 MCG (5000 UT) CAPS Take 5,000 Units by mouth daily with  breakfast.     citalopram (CELEXA) 10 MG tablet Take 1 tablet by mouth once daily 90 tablet 3   Coenzyme Q10 (COQ10) 200 MG CAPS Take 200 mg by mouth in the morning.     doxazosin (CARDURA) 2 MG tablet Take 0.5 tablets (1 mg total) by mouth 2 (two) times daily. Take 0.5 tablet (1 mg total) by mouth 2 (two) times daily if Doppler BP 120 or greater. Check BP in the morning and evening. 30 tablet 5   Famotidine (PEPCID PO) Take 10 mg by mouth every morning.     fludrocortisone (FLORINEF) 0.1 MG tablet Take 1 tablet (0.1 mg total) by mouth daily. 30 tablet 3   furosemide (LASIX) 20 MG tablet Take 1 tablet (20 mg total) by mouth daily as needed (Take 20 mg as daily as needed for wt > 153 lbs). Reported on 01/14/2016 30 tablet 6   levothyroxine (SYNTHROID) 175 MCG tablet 1 tablet daily every morning except half tablet on Sundays (Patient taking differently: Take 87.5-175 mcg by mouth See admin  instructions. Take 1 tablet daily every morning except half tablet on Sundays) 90 tablet 2   melatonin 5 MG TABS Take 5 mg by mouth at bedtime as needed (for sleep).     mexiletine (MEXITIL) 150 MG capsule Take 1 capsule by mouth twice daily 60 capsule 0   midodrine (PROAMATINE) 5 MG tablet Take 1 tablet (5 mg total) by mouth 2 (two) times daily as needed. Take 5 mg if doppler is 90 or less. 60 tablet 5   pantoprazole (PROTONIX) 40 MG tablet Take 1 tablet by mouth once daily 90 tablet 0   Probiotic Product (PROBIOTIC PO) Take 1 capsule by mouth in the morning.     promethazine (PHENERGAN) 12.5 MG tablet Take 1 tablet (12.5 mg total) by mouth every 6 (six) hours as needed for nausea or vomiting. 30 tablet 0   rosuvastatin (CRESTOR) 20 MG tablet Take 1 tablet (20 mg total) by mouth daily. (Patient taking differently: Take 20 mg by mouth every morning.) 90 tablet 3   sotalol (BETAPACE) 80 MG tablet Take 1 tablet (80 mg total) by mouth at bedtime. 30 tablet 1   traMADol (ULTRAM) 50 MG tablet Take 1-2 tablets (50-100 mg  total) by mouth every 6 (six) hours as needed for moderate pain. 40 tablet 5   traZODone (DESYREL) 50 MG tablet TAKE 1 TABLET BY MOUTH AT BEDTIME (Patient taking differently: Take 50 mg by mouth at bedtime.) 30 tablet 6   warfarin (COUMADIN) 5 MG tablet Take 5 mg (1 tablet) every day as directed by HF Clinic 135 tablet 11   No current facility-administered medications for this visit.   Facility-Administered Medications Ordered in Other Visits  Medication Dose Route Frequency Provider Last Rate Last Admin   etomidate (AMIDATE) injection    Anesthesia Intra-op Myna Bright, CRNA   20 mg at 02/08/14 0915   rocuronium [facility]) injection    Anesthesia Intra-op Myna Bright, CRNA   50 mg at 02/08/14 0932   succinylcholine (ANECTINE) injection    Anesthesia Intra-op Myna Bright, CRNA   100 mg at 02/08/14 0915    Allergies:   Ace inhibitors, Amiodarone, Diovan [valsartan], Lipitor [atorvastatin calcium], Norvasc [amlodipine], and Jardiance [empagliflozin]   Social History:  The patient  reports that he has never smoked. He has never used smokeless tobacco. He reports that he does not drink alcohol and does not use drugs.   Family History:  The patient's family history includes Coronary artery disease in his mother; Heart disease in his mother; Heart failure in his father; Hyperlipidemia in his sister; Stroke in his father.   ROS:  Please see the history of present illness.   Otherwise, review of systems is positive for none.   All other systems are reviewed and negative.   PHYSICAL EXAM: VS:  BP 126/66   Pulse 86   Ht 5\' 7"  (1.702 m)   Wt 163 lb (73.9 kg)   SpO2 100%   BMI 25.53 kg/m  , BMI Body mass index is 25.53 kg/m. GEN: Well nourished, well developed, in no acute distress  HEENT: normal  Neck: no JVD, carotid bruits, or masses Cardiac: RRR, LVAD hum; no murmurs, rubs, or gallops,no edema  Respiratory:  clear to auscultation bilaterally, normal work of  breathing GI: soft, nontender, nondistended, + BS MS: no deformity or atrophy  Skin: warm and dry, device site well healed Neuro:  Strength and sensation are intact Psych: euthymic mood, full affect  EKG:  EKG is  not ordered today. Personal review of the ekg ordered 08/17/22 shows sinus rhythm, ventricular paced  Personal review of the device interrogation today. Results in [place]: 02/09/2022: TSH 4.551 03/11/2022: Magnesium 1.9 08/08/2022: ALT 10; BUN 25; Creatinine, Ser 2.21; Hemoglobin 11.4; Platelets 136; Potassium 3.8; Sodium 134    Lipid Panel     Component Value Date/Time   CHOL 93 05/28/2015 0943   TRIG 112.0 05/28/2015 0943   HDL 41.50 05/28/2015 0943   CHOLHDL 2 05/28/2015 0943   VLDL 22.4 05/28/2015 0943   LDLCALC 29 05/28/2015 0943     Wt Readings from Last 3 Encounters:  09/02/22 163 lb (73.9 kg)  08/08/22 160 lb (72.6 kg)  07/20/22 162 lb 6.4 oz (73.7 kg)      Other studies Reviewed: Additional studies/ records that were reviewed today include: TTE 06/28/19  Review of the above records today demonstrates:   1. Left ventricular ejection fraction, by visual estimation, is <20%. The  left ventricle has severely decreased function. Left ventricular septal  wall thickness was normal. Normal left ventricular posterior wall  thickness. There is no left ventricular  hypertrophy.   2. LVAD: LVAD inflow cannula well-seated and without septal  contact.summary.   3. Left ventricular diastolic parameters are consistent with Grade I  diastolic dysfunction (impaired relaxation).   4. Moderately dilated left ventricular internal cavity size.   5. Global right ventricle has moderately reduced systolic function.The  right ventricular size is normal. No increase in right ventricular wall  thickness.   6. Left atrial size was mildly dilated.   7. Right atrial size was normal.   8. The mitral valve is normal in structure. Moderate mitral valve   regurgitation. No evidence of mitral stenosis.   9. The tricuspid valve is normal in structure. Tricuspid valve  regurgitation is trivial.  10. Aortic valve regurgitation is moderate.  11. The aortic valve is tricuspid. Aortic valve regurgitation is moderate.  No evidence of aortic valve sclerosis or stenosis.  12. Moderate, continuous aortic regurgitation.  13. The pulmonic valve was normal in structure. Pulmonic valve  regurgitation is not visualized.  14. Mildly elevated pulmonary artery systolic pressure.  15. The inferior vena cava is dilated in size with >50% respiratory  variability, suggesting right atrial pressure of 8 mmHg.    ASSESSMENT AND PLAN:  1.  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  He has a Medtronic ICD.  Device functioning appropriately.  Currently on sotalol and mexiletine, doses above.  No further ventricular arrhythmias.  2.  Chronic systolic heart failure: Due to mixed cardiomyopathy.  Status post HeartMate 2 LVAD.  Followed by heart failure cardiology.  3.  High risk medication monitoring: On sotalol and mexiletine.  QTc acceptable for left bundle branch block.   Current medicines are reviewed at length with the patient today.   The patient does not have concerns regarding his medicines.  The following changes were made today: None  Labs/ tests ordered today include:  No orders of the defined types were placed in this encounter.    Disposition:   FU with Aleksi Brummet 12 months  Signed, Tim Wilhide Meredith Leeds, MD  09/02/2022 2:36 PM     [facility] [address] [place] [address] [phone] (office) [phone] (fax)

## 2022-09-03 ENCOUNTER — Ambulatory Visit (HOSPITAL_COMMUNITY): Payer: Self-pay | Admitting: Pharmacist

## 2022-09-03 ENCOUNTER — Ambulatory Visit (HOSPITAL_COMMUNITY)
Admission: RE | Admit: 2022-09-03 | Discharge: 2022-09-03 | Disposition: A | Discharge status: Home / Self Care | Payer: Medicare Other | Source: Ambulatory Visit | Attending: Cardiology | Admitting: Cardiology

## 2022-09-03 DIAGNOSIS — Z7901 Long term (current) use of anticoagulants: Secondary | ICD-10-CM | POA: Insufficient documentation

## 2022-09-03 DIAGNOSIS — Z95811 Presence of heart assist device: Secondary | ICD-10-CM | POA: Insufficient documentation

## 2022-09-03 LAB — PROTIME-INR
INR: 3.2 — ABNORMAL HIGH (ref 0.8–1.2)
Prothrombin Time: 32.2 seconds — ABNORMAL HIGH (ref 11.4–15.2)

## 2022-09-16 ENCOUNTER — Other Ambulatory Visit (HOSPITAL_COMMUNITY): Payer: Self-pay

## 2022-09-16 DIAGNOSIS — Z7901 Long term (current) use of anticoagulants: Secondary | ICD-10-CM

## 2022-09-16 DIAGNOSIS — Z95811 Presence of heart assist device: Secondary | ICD-10-CM

## 2022-09-21 ENCOUNTER — Other Ambulatory Visit (HOSPITAL_COMMUNITY): Payer: Self-pay | Admitting: Internal Medicine

## 2022-09-21 ENCOUNTER — Other Ambulatory Visit (HOSPITAL_COMMUNITY): Payer: Self-pay | Admitting: Unknown Physician Specialty

## 2022-09-21 DIAGNOSIS — Z95811 Presence of heart assist device: Secondary | ICD-10-CM

## 2022-09-21 DIAGNOSIS — I493 Ventricular premature depolarization: Secondary | ICD-10-CM

## 2022-09-21 DIAGNOSIS — I472 Ventricular tachycardia, unspecified: Secondary | ICD-10-CM

## 2022-09-21 DIAGNOSIS — Z7901 Long term (current) use of anticoagulants: Secondary | ICD-10-CM

## 2022-09-22 ENCOUNTER — Ambulatory Visit (HOSPITAL_COMMUNITY)
Admission: RE | Admit: 2022-09-22 | Discharge: 2022-09-22 | Disposition: A | Discharge status: Home / Self Care | Payer: Medicare Other | Source: Ambulatory Visit | Attending: Internal Medicine | Admitting: Internal Medicine

## 2022-09-22 ENCOUNTER — Encounter (HOSPITAL_COMMUNITY): Payer: Self-pay | Admitting: Internal Medicine

## 2022-09-22 ENCOUNTER — Ambulatory Visit (HOSPITAL_COMMUNITY): Payer: Self-pay | Admitting: Pharmacist

## 2022-09-22 VITALS — BP 130/100 | HR 82 | Wt 162.8 lb

## 2022-09-22 DIAGNOSIS — Z7901 Long term (current) use of anticoagulants: Secondary | ICD-10-CM

## 2022-09-22 DIAGNOSIS — R55 Syncope and collapse: Secondary | ICD-10-CM

## 2022-09-22 DIAGNOSIS — Z79899 Other long term (current) drug therapy: Secondary | ICD-10-CM | POA: Diagnosis not present

## 2022-09-22 DIAGNOSIS — I951 Orthostatic hypotension: Secondary | ICD-10-CM | POA: Insufficient documentation

## 2022-09-22 DIAGNOSIS — Z7982 Long term (current) use of aspirin: Secondary | ICD-10-CM | POA: Diagnosis not present

## 2022-09-22 DIAGNOSIS — I5082 Biventricular heart failure: Secondary | ICD-10-CM | POA: Insufficient documentation

## 2022-09-22 DIAGNOSIS — I130 Hypertensive heart and chronic kidney disease with heart failure and stage 1 through stage 4 chronic kidney disease, or unspecified chronic kidney disease: Secondary | ICD-10-CM | POA: Insufficient documentation

## 2022-09-22 DIAGNOSIS — D649 Anemia, unspecified: Secondary | ICD-10-CM | POA: Diagnosis not present

## 2022-09-22 DIAGNOSIS — Z95810 Presence of automatic (implantable) cardiac defibrillator: Secondary | ICD-10-CM | POA: Insufficient documentation

## 2022-09-22 DIAGNOSIS — Z95811 Presence of heart assist device: Secondary | ICD-10-CM

## 2022-09-22 DIAGNOSIS — I2510 Atherosclerotic heart disease of native coronary artery without angina pectoris: Secondary | ICD-10-CM | POA: Insufficient documentation

## 2022-09-22 DIAGNOSIS — R42 Dizziness and giddiness: Secondary | ICD-10-CM | POA: Diagnosis not present

## 2022-09-22 DIAGNOSIS — I4892 Unspecified atrial flutter: Secondary | ICD-10-CM | POA: Diagnosis not present

## 2022-09-22 DIAGNOSIS — E039 Hypothyroidism, unspecified: Secondary | ICD-10-CM | POA: Insufficient documentation

## 2022-09-22 DIAGNOSIS — I493 Ventricular premature depolarization: Secondary | ICD-10-CM | POA: Insufficient documentation

## 2022-09-22 DIAGNOSIS — I255 Ischemic cardiomyopathy: Secondary | ICD-10-CM | POA: Insufficient documentation

## 2022-09-22 DIAGNOSIS — N184 Chronic kidney disease, stage 4 (severe): Secondary | ICD-10-CM

## 2022-09-22 DIAGNOSIS — I5022 Chronic systolic (congestive) heart failure: Secondary | ICD-10-CM | POA: Diagnosis not present

## 2022-09-22 DIAGNOSIS — I472 Ventricular tachycardia, unspecified: Secondary | ICD-10-CM | POA: Diagnosis not present

## 2022-09-22 LAB — CBC
HCT: 38.3 % — ABNORMAL LOW (ref 39.0–52.0)
Hemoglobin: 12.3 g/dL — ABNORMAL LOW (ref 13.0–17.0)
MCH: 29.9 pg (ref 26.0–34.0)
MCHC: 32.1 g/dL (ref 30.0–36.0)
MCV: 93 fL (ref 80.0–100.0)
Platelets: 158 10*3/uL (ref 150–400)
RBC: 4.12 MIL/uL — ABNORMAL LOW (ref 4.22–5.81)
RDW: 15.9 % — ABNORMAL HIGH (ref 11.5–15.5)
WBC: 4.5 10*3/uL (ref 4.0–10.5)
nRBC: 0 % (ref 0.0–0.2)

## 2022-09-22 LAB — COMPREHENSIVE METABOLIC PANEL
ALT: 15 U/L (ref 0–44)
AST: 21 U/L (ref 15–41)
Albumin: 4.1 g/dL (ref 3.5–5.0)
Alkaline Phosphatase: 76 U/L (ref 38–126)
Anion gap: 9 (ref 5–15)
BUN: 35 mg/dL — ABNORMAL HIGH (ref 8–23)
CO2: 23 mmol/L (ref 22–32)
Calcium: 9.3 mg/dL (ref 8.9–10.3)
Chloride: 106 mmol/L (ref 98–111)
Creatinine, Ser: 2.31 mg/dL — ABNORMAL HIGH (ref 0.61–1.24)
GFR, Estimated: 28 mL/min — ABNORMAL LOW (ref >60)
Glucose, Bld: 119 mg/dL — ABNORMAL HIGH (ref 70–99)
Potassium: 4 mmol/L (ref 3.5–5.1)
Sodium: 138 mmol/L (ref 135–145)
Total Bilirubin: 0.8 mg/dL (ref 0.3–1.2)
Total Protein: 7 g/dL (ref 6.5–8.1)

## 2022-09-22 LAB — LACTATE DEHYDROGENASE: LDH: 264 U/L — ABNORMAL HIGH (ref 98–192)

## 2022-09-22 LAB — PROTIME-INR
INR: 2.5 — ABNORMAL HIGH (ref 0.8–1.2)
Prothrombin Time: 26.9 seconds — ABNORMAL HIGH (ref 11.4–15.2)

## 2022-09-22 LAB — PREALBUMIN: Prealbumin: 22 mg/dL (ref 18–38)

## 2022-09-22 NOTE — Patient Instructions (Addendum)
No medication changes Coumadin dosing per Lauren PharmD Return to clinic in 2 months for follow up with Dr Haroldine Laws We will refer you to [facility] for management of your kidneys

## 2022-09-22 NOTE — Progress Notes (Addendum)
VAD CLINIC NOTE    HPI:  Johnny Oneill is a 82 y.o. 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   Presented to ER on 06/27/19 with syncopal episode and head injury from fall. ECG showed VT @ 240. Underwent emergent DC-CV in ER. Head CT no bleed. Admitted and had ICD gen replacement (EOL). -> Did not tolerate amio due to tremors.  Had ICD shock for VT on 06/09/20. Started on sotalol  On 02/06/21 Paced out of AFL.  Seen in ER 05/2021 due to LOW FLOW alarms. Found to be in VT rate 170. DCCV x 1 - returned to [place] with frequent PVCs. Pacer settings adjusted by [name] PA- DDD 90.  [place] 11/22 to assess for RV failure RA = 5 RV = 33/5 PA = 33/13 (20) PCW = 10 Fick cardiac output/index = 4.0/2.1 Thermo CO/CI = 4.9/2.6 PVR = 2.0 WU Ao sat = 95% PA sat = 54%, 59% PAPI = 4.0  Admitted 12/01/21 for R TKA. Did well with surgery but complicated by severe orthostatic hypotension. Midodrine added    Admitted 12/18/21 with syncope. No events on device. Hydrated. Midodrine increased to 15 tid. Sotalol held  Admitted 12/28/21 with recurrent VT. Had DC-CV in ER. Sotalol restarted. Continued to have elevated MAPs as well as orthostasis. Midodrine decreased and florinef added.   Admitted 02/09/22 with  a/c RV failure w/ cardiogenic shock. LFTs, INR, creatinine elevated on admit. Started on empiric milrinone with gradual improvement. LFTs/INR/creatinine improved. Had [place] primary left sided failure. LVAD speed optimized with speed increased to 9800.  Milrinone stopped without difficulty. INR followed closely by pharmacy with adjustments to coumadin.   Here for f/u. Doing great. Continues to ref for volleyball. Taking midodrine 5mg  daily with resolutions of orthostasis. Says he is able to walk more after his knee replacement. Minimal DOE. No orthopnea or PND. Denies orthopnea or PND. No fevers, chills or problems with  driveline. No bleeding, melena or neuro symptoms. No VAD alarms. Taking all meds as prescribed.    VAD Indication: Destination therapy- patient choice     VAD interrogation & Equipment Management: Speed: 9800 Flow: 3.9 Power: 5.5 w    PI: 6.6   Alarms: none Events: 10-20 daily  Fixed speed: 9800  Low speed limit: 9200   Primary Controller:  Replace back up battery in 21 months Back up controller:  Replace back up battery in 8 months   Annual Equipment Maintenance on UBC/PM was performed 09/22/22   I reviewed the LVAD parameters from today and compared the results to the patient's prior recorded data. LVAD interrogation was NEGATIVE for significant power changes, NEGATIVE for clinical alarms and POSITIVE for PI events/speed drops. No programming changes were made and pump is functioning within specified parameters. Pt is performing daily controller and system monitor self tests along with completing weekly and monthly maintenance for LVAD equipment.   Exit Site Care: Drive line is being maintained weekly by [name], his wife. Existing VAD dressing C/D/I. [name] states improvement with redness at site from gauze dressing. Pt provided with 8 weekly dressings for home.   Past Medical History:  Diagnosis Date   Anxiety    Automatic implantable cardioverter-defibrillator in situ    CAD (coronary artery disease)    S/P stenting of LCX in 2004;  s/p Lat MI 2009 with occlusion of the LCX - treated with Promus stenting. Has  total occlusion of the RCA.    CHF (congestive heart failure) (HCC)    Hypercholesteremia    Hypertension    Hypothyroidism    ICD (implantable cardiac defibrillator) in place    PROPHYLACTIC      medtronic   Inferior MI ([place]) "? date"; 2009   Ischemic cardiomyopathy    a. 10/09 Echo: Sev LV Dysfxn, inf/lat AK, Mod MR.   Liver disease    LVAD (left ventricular assist device) present ([place])    Osteoarthritis    a. s/p R TKR   Pacemaker    PVC (premature ventricular  contraction)    RBBB    Shortness of breath     Current Outpatient Medications  Medication Sig Dispense Refill   acetaminophen (TYLENOL) 500 MG tablet Take 1,000 mg by mouth every 6 (six) hours as needed (for pain or headaches).     aspirin EC 81 MG tablet Take 81 mg by mouth in the morning.     Cholecalciferol (VITAMIN D3) 125 MCG (5000 UT) CAPS Take 5,000 Units by mouth daily with breakfast.     citalopram (CELEXA) 10 MG tablet Take 1 tablet by mouth once daily 90 tablet 3   Coenzyme Q10 (COQ10) 200 MG CAPS Take 200 mg by mouth in the morning.     Famotidine (PEPCID PO) Take 10 mg by mouth every morning.     levothyroxine (SYNTHROID) 175 MCG tablet 1 tablet daily every morning except half tablet on Sundays (Patient taking differently: Take 87.5-175 mcg by mouth See admin instructions. Take 1 tablet daily every morning except half tablet on Sundays) 90 tablet 2   melatonin 5 MG TABS Take 5 mg by mouth at bedtime as needed (for sleep).     mexiletine (MEXITIL) 150 MG capsule Take 1 capsule by mouth twice daily 60 capsule 0   midodrine (PROAMATINE) 5 MG tablet Take 1 tablet (5 mg total) by mouth 2 (two) times daily as needed. Take 5 mg if doppler is 90 or less. 60 tablet 5   pantoprazole (PROTONIX) 40 MG tablet Take 1 tablet by mouth once daily 90 tablet 0   Probiotic Product (PROBIOTIC PO) Take 1 capsule by mouth in the morning.     rosuvastatin (CRESTOR) 20 MG tablet Take 1 tablet (20 mg total) by mouth daily. (Patient taking differently: Take 20 mg by mouth every morning.) 90 tablet 3   sotalol (BETAPACE) 80 MG tablet Take 1 tablet (80 mg total) by mouth at bedtime. 30 tablet 1   traZODone (DESYREL) 50 MG tablet TAKE 1 TABLET BY MOUTH AT BEDTIME (Patient taking differently: Take 50 mg by mouth at bedtime.) 30 tablet 6   warfarin (COUMADIN) 5 MG tablet Take 5 mg (1 tablet) every day as directed by [facility] Clinic 135 tablet 11   doxazosin (CARDURA) 2 MG tablet Take 0.5 tablets (1 mg total) by mouth  2 (two) times daily. Take 0.5 tablet (1 mg total) by mouth 2 (two) times daily if Doppler BP 120 or greater. Check BP in the morning and evening. (Patient not taking: Reported on 09/22/2022) 30 tablet 5   fludrocortisone (FLORINEF) 0.1 MG tablet Take 1 tablet (0.1 mg total) by mouth daily. (Patient not taking: Reported on 09/22/2022) 30 tablet 3   furosemide (LASIX) 20 MG tablet Take 1 tablet (20 mg total) by mouth daily as needed (Take 20 mg as daily as needed for wt > 153 lbs). Reported on 01/14/2016 (Patient not taking: Reported on 09/22/2022) 30 tablet 6  promethazine (PHENERGAN) 12.5 MG tablet Take 1 tablet (12.5 mg total) by mouth every 6 (six) hours as needed for nausea or vomiting. (Patient not taking: Reported on 09/22/2022) 30 tablet 0   traMADol (ULTRAM) 50 MG tablet Take 1-2 tablets (50-100 mg total) by mouth every 6 (six) hours as needed for moderate pain. (Patient not taking: Reported on 09/22/2022) 40 tablet 5   No current facility-administered medications for this encounter.   Facility-Administered Medications Ordered in Other Encounters  Medication Dose Route Frequency Provider Last Rate Last Admin   etomidate (AMIDATE) injection    Anesthesia Intra-op Myna Bright, CRNA   20 mg at 02/08/14 0915   rocuronium [facility]) injection    Anesthesia Intra-op Myna Bright, CRNA   50 mg at 02/08/14 0932   succinylcholine (ANECTINE) injection    Anesthesia Intra-op Myna Bright, CRNA   100 mg at 02/08/14 0915   Ace inhibitors, Amiodarone, Diovan [valsartan], Lipitor [atorvastatin calcium], Norvasc [amlodipine], and Jardiance [empagliflozin]  Review of systems complete and found to be negative unless listed in HPI.     Vitals:   09/22/22 1013 09/22/22 1015  BP: (!) 115/0 (!) 130/100  Pulse: 82   SpO2: 96%    Wt Readings from Last 3 Encounters:  09/22/22 73.8 kg (162 lb 12.8 oz)  09/02/22 73.9 kg (163 lb)  08/08/22 72.6 kg (160 lb)      Vital Signs:   Doppler  Pressure: 115 Automatc BP: 130/100 (100) HR: 82 SPO2: 96% on RA   Weight: 162.8 lb w/ eqt Last weight: 162.4 lb w/eqt   Physical Exam: General:  NAD.  HEENT: normal  Neck: supple. JVP 6-7  Carotids 2+ bilat; no bruits. No lymphadenopathy or thryomegaly appreciated. Cor: LVAD hum.  Lungs: Clear. Abdomen: soft, nontender, non-distended. No hepatosplenomegaly. No bruits or masses. Good bowel sounds. Driveline site clean. Anchor in place.  Extremities: no cyanosis, clubbing, rash. Warm no edema  Neuro: alert & oriented x 3. No focal deficits. Moves all 4 without problem     ASSESSMENT AND PLAN:   1) HTN complicated by orthostatic hypotension - Much improved with midodrine 5mg  daily.   2) Chronic systolic HF: ICM, EF 36-14% s/p LVAD HM II implant 08/2013 for DT.   - Echo 3/18 EF 10-15% mild AI. Mod RV dysfunction. LVAD cannula OK - has struggled with RV failure however RHC 11/22 looked good (despite evidence of RVF on LE dopplers showing reflux - Much improved NYHA II - Volume status ok. Avoid volume depletion  -Failed Jardiance due to volume depletion - He has not tolerated ACE-I or amlodipine or b-blocker in the past.  3) LVAD: HMII 08/2013 for DT. - VAD interrogated personally. Parameters stable. - Continue aspirin and coumadin.  - LDH 264 - INR 2.5  Goal 2.-2.5 Discussed dosing with PharmD personally. - DL site ok  - MAPs ok on midodrine  4) VT/Frequent PVCs - now s/p ICD gen change.  - Off amio due to cerebellar side effects.  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 - VT  06/16/21 with  DC-CV x 1 - Recurrent VT 12/28/21 after stopping sotalol. Sotalol restarted. QT ok.  - PVCs improved on mexilitene. Continue mexilitene 150 bid - Keep K > 4.0 Mg > 2.0 - Continue to f/u with EP. - Continue sotalol and mexilitene. QT ok - No recent VT  5) Atrial flutter, recurrent - patient paced out of AFL in 6/22. Atrial therapies enabled  - continue sotalol (on for VT). QTs slightly  prolonged but  stable - In SR on monitor today  6) Chronic anticoagulation: INR goal 2.0-3.0.    - INR 2.5 today. Discussed dosing with PharmD personally. - Continue ASA 81 mg for LVAD + warfarin.   7) Hypothyroidism:  - Followed by Dr Dwyane Dee. Stable.No change   8) Hyperlipidemia:  - Continue Crestor. Zetia stopped due to cost.  - No change.   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 Over half that time spent discussing above.    Glori Bickers, MD  10:50 PM

## 2022-09-22 NOTE — Addendum Note (Signed)
Encounter addended by: Jolaine Artist, MD on: 09/22/2022 10:50 PM  Actions taken: Clinical Note Signed

## 2022-09-22 NOTE — Progress Notes (Addendum)
Patient presents for 2 month follow up and 9 yr Intermacs and annual maintenance in [facility] today with [name]. Reports no problems with VAD equipment or concerns with drive line.   Pt ambulated into clinic unassisted. Pt states he has intermittent lightheadedness in the afternoon if his blood pressure drops. 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 Otherwise has no complaints. He has been cleaning the church and staying active. Denies falls, heart failure symptoms, and signs of bleeding. Pt endorses good appetite.   [name] reports he is taking 5 mg of Midodrine daily in the morning. His orthostatic symptoms have greatly improved since starting this. BP mildly elevated today. No changes today per Dr Haroldine Laws.   Reports he is drinking 1 L per day. Encouraged to drink 2 L daily to avoid low BP/lightheaded episodes. He verbalized understanding.   Dr Haroldine Laws would like pt referred to [facility] for assistance in managing pt's kidney function. Referral sent today.   Vital Signs:   Doppler Pressure: 115 Automatc BP: 130/100 (100) HR: 82 SPO2: 96% on RA   Weight: 162.8 lb w/ eqt Last weight: 162.4 lb w/eqt  VAD Indication: Destination therapy- patient choice     VAD interrogation & Equipment Management: Speed: 9800 Flow: 3.9 Power: 5.5 w    PI: 6.6   Alarms: none Events: 10-20 daily  Fixed speed: 9800  Low speed limit: 9200   Primary Controller:  Replace back up battery in 21 months Back up controller:  Replace back up battery in 8 months   Annual Equipment Maintenance on UBC/PM was performed 09/22/22   I reviewed the LVAD parameters from today and compared the results to the patient's prior recorded data. LVAD interrogation was NEGATIVE for significant power changes, NEGATIVE for clinical alarms and POSITIVE for PI events/speed drops. No programming changes were made and pump is functioning within specified parameters. Pt is performing  daily controller and system monitor self tests along with completing weekly and monthly maintenance for LVAD equipment.  Exit Site Care: Drive line is being maintained weekly by [name], his wife. Existing VAD dressing C/D/I. [name] states improvement with redness at site from gauze dressing. Pt provided with 8 weekly dressings for home.   Significant Events on VAD Support:  10/2013>SBO 12/2016> elevated LDH, admit for [place]   Device: Dual Medtronic Therapies:  on 231  Pacing: AAI<=>DDD 80 bpm  Last check:  06/26/21  BP & Labs:  Doppler 122 - Doppler is reflecting modified systolic  Hgb pending - No S/S of bleeding. Specifically denies melena/BRBPR or nosebleeds.   LDH pending- established baseline of 200- 350. Denies tea-colored urine. No power elevations noted on interrogation.    Batteries Manufacture Date: Number of uses: Re-calibration  12/08/18 92 Performed by patient  01/03/19 111 Performed by patient  Pt has 2 sets of batteries manufactured in 2022- did not bring to clinic today.   Annual maintenance completed per [employer] on patient's home MPU and universal Char[employer] fundraiser. Pt has power module at home, but he does not use this.    Backup system controller 11 volt battery charged during visit.   9 year Intermacs follow up completed including:  Quality of Life, KCCQ-12, and Neurocognitive trail making.   Pt completed 1400 feet during 6 minute walk.  Patient Goals: To stay alive and continue to have a good quality of life. They are traveling to [facility] in March.   [facility] Cardiomyopathy Questionnaire  09/22/2022   10:59 AM 03/11/2022    2:26 PM 09/23/2021    3:12 PM  KCCQ-12  1 a. Ability to shower/bathe Quite a bit limited Slightly limited Slightly limited  1 b. Ability to walk 1 block Slightly limited Slightly limited Moderately limited  1 c. Ability to hurry/jog Slightly limited Quite a bit limited Quite a bit limited  2. Edema feet/ankles/legs Less than once a  week Less than once a week Less than once a week  3. Limited by fatigue Less than once a week Less than once a week 1-2 times a week  4. Limited by dyspnea Less than once a week 1-2 times a week 1-2 times a week  5. Sitting up / on 3+ pillows Never over the past 2 weeks Never over the past 2 weeks Never over the past 2 weeks  6. Limited enjoyment of life Slightly limited Slightly limited Not limited at all  7. Rest of life w/ symptoms Completely satisfied Completely satisfied Completely satisfied  8 a. Participation in hobbies Did not limit at all Did not limit at all Slightly limited  8 b. Participation in chores Slightly limited Slightly limited Slightly limited  8 c. Visiting family/friends Did not limit at all Did not limit at all Did not limit at all     Patient Instructions:  No medication changes Coumadin dosing per Lauren PharmD Return to clinic in 2 months for follow up with Dr Haroldine Laws  Emerson Monte RN [place] Coordinator  Office: [phone]  24/7 Pager: [phone]

## 2022-09-24 ENCOUNTER — Ambulatory Visit (INDEPENDENT_AMBULATORY_CARE_PROVIDER_SITE_OTHER): Payer: Medicare Other

## 2022-09-24 DIAGNOSIS — I255 Ischemic cardiomyopathy: Secondary | ICD-10-CM | POA: Diagnosis not present

## 2022-09-24 LAB — CUP PACEART REMOTE DEVICE CHECK
Battery Remaining Longevity: 54 mo
Battery Voltage: 2.97 V
Brady Statistic AP VP Percent: 32.51 %
Brady Statistic AP VS Percent: 65.31 %
Brady Statistic AS VP Percent: 0.65 %
Brady Statistic AS VS Percent: 1.52 %
Brady Statistic RA Percent Paced: 97.72 %
Brady Statistic RV Percent Paced: 33.48 %
Date Time Interrogation Session: 20240125043725
HighPow Impedance: 38 Ohm
HighPow Impedance: 47 Ohm
Implantable Lead Connection Status: 753985
Implantable Lead Connection Status: 753985
Implantable Lead Implant Date: 20091103
Implantable Lead Implant Date: 20091103
Implantable Lead Location: 753859
Implantable Lead Location: 753860
Implantable Lead Model: 5076
Implantable Lead Model: 6947
Implantable Pulse Generator Implant Date: 20201028
Lead Channel Impedance Value: 342 Ohm
Lead Channel Impedance Value: 361 Ohm
Lead Channel Impedance Value: 361 Ohm
Lead Channel Pacing Threshold Amplitude: 0.5 V
Lead Channel Pacing Threshold Amplitude: 1.25 V
Lead Channel Pacing Threshold Pulse Width: 0.4 ms
Lead Channel Pacing Threshold Pulse Width: 0.4 ms
Lead Channel Sensing Intrinsic Amplitude: 1.875 mV
Lead Channel Sensing Intrinsic Amplitude: 1.875 mV
Lead Channel Sensing Intrinsic Amplitude: 3.75 mV
Lead Channel Sensing Intrinsic Amplitude: 3.75 mV
Lead Channel Setting Pacing Amplitude: 1.5 V
Lead Channel Setting Pacing Amplitude: 2.75 V
Lead Channel Setting Pacing Pulse Width: 0.6 ms
Lead Channel Setting Sensing Sensitivity: 0.3 mV

## 2022-10-06 ENCOUNTER — Telehealth (HOSPITAL_COMMUNITY): Payer: Self-pay

## 2022-10-06 NOTE — Telephone Encounter (Signed)
Patient's wife called [facility] Clinic to check the status of referral to [facility]. VAD Coordinator called to check the status of the referral and it is still pending MD approval. [facility] will call to schedule appointment once reviewed by physician. Patient's wife notified.  Bobbye Morton RN, BSN VAD Coordinator 24/7 Pager [phone]

## 2022-10-09 ENCOUNTER — Other Ambulatory Visit (HOSPITAL_COMMUNITY): Payer: Self-pay | Admitting: Unknown Physician Specialty

## 2022-10-09 DIAGNOSIS — Z95811 Presence of heart assist device: Secondary | ICD-10-CM

## 2022-10-09 DIAGNOSIS — Z7901 Long term (current) use of anticoagulants: Secondary | ICD-10-CM

## 2022-10-13 NOTE — Progress Notes (Signed)
Remote ICD transmission.   

## 2022-10-14 ENCOUNTER — Ambulatory Visit (HOSPITAL_COMMUNITY)
Admission: RE | Admit: 2022-10-14 | Discharge: 2022-10-14 | Disposition: A | Discharge status: Home / Self Care | Payer: Medicare Other | Source: Ambulatory Visit | Attending: Internal Medicine | Admitting: Internal Medicine

## 2022-10-14 ENCOUNTER — Ambulatory Visit (HOSPITAL_COMMUNITY): Payer: Self-pay | Admitting: Pharmacist

## 2022-10-14 DIAGNOSIS — Z95811 Presence of heart assist device: Secondary | ICD-10-CM | POA: Insufficient documentation

## 2022-10-14 DIAGNOSIS — Z7901 Long term (current) use of anticoagulants: Secondary | ICD-10-CM | POA: Diagnosis not present

## 2022-10-14 LAB — LACTATE DEHYDROGENASE: LDH: 275 U/L — ABNORMAL HIGH (ref 98–192)

## 2022-10-14 LAB — PROTIME-INR
INR: 2.9 — ABNORMAL HIGH (ref 0.8–1.2)
Prothrombin Time: 30.2 seconds — ABNORMAL HIGH (ref 11.4–15.2)

## 2022-10-23 ENCOUNTER — Other Ambulatory Visit (HOSPITAL_COMMUNITY): Payer: Self-pay | Admitting: Internal Medicine

## 2022-10-23 DIAGNOSIS — I493 Ventricular premature depolarization: Secondary | ICD-10-CM

## 2022-10-23 DIAGNOSIS — I472 Ventricular tachycardia, unspecified: Secondary | ICD-10-CM

## 2022-10-23 DIAGNOSIS — Z95811 Presence of heart assist device: Secondary | ICD-10-CM

## 2022-10-29 ENCOUNTER — Other Ambulatory Visit (HOSPITAL_COMMUNITY): Payer: Self-pay

## 2022-10-29 DIAGNOSIS — Z95811 Presence of heart assist device: Secondary | ICD-10-CM

## 2022-10-29 DIAGNOSIS — Z7901 Long term (current) use of anticoagulants: Secondary | ICD-10-CM

## 2022-11-04 ENCOUNTER — Ambulatory Visit (HOSPITAL_COMMUNITY): Payer: Self-pay | Admitting: Pharmacist

## 2022-11-04 ENCOUNTER — Ambulatory Visit (HOSPITAL_COMMUNITY)
Admission: RE | Admit: 2022-11-04 | Discharge: 2022-11-04 | Disposition: A | Discharge status: Home / Self Care | Payer: Medicare Other | Source: Ambulatory Visit | Attending: Cardiology | Admitting: Cardiology

## 2022-11-04 DIAGNOSIS — Z95811 Presence of heart assist device: Secondary | ICD-10-CM | POA: Insufficient documentation

## 2022-11-04 DIAGNOSIS — Z7901 Long term (current) use of anticoagulants: Secondary | ICD-10-CM | POA: Diagnosis not present

## 2022-11-04 LAB — PROTIME-INR
INR: 2.8 — ABNORMAL HIGH (ref 0.8–1.2)
Prothrombin Time: 29.2 seconds — ABNORMAL HIGH (ref 11.4–15.2)

## 2022-11-13 ENCOUNTER — Encounter (HOSPITAL_COMMUNITY): Payer: Self-pay | Admitting: Unknown Physician Specialty

## 2022-11-20 ENCOUNTER — Telehealth: Payer: Self-pay

## 2022-11-20 ENCOUNTER — Other Ambulatory Visit (HOSPITAL_COMMUNITY): Payer: Self-pay | Admitting: *Deleted

## 2022-11-20 DIAGNOSIS — Z95811 Presence of heart assist device: Secondary | ICD-10-CM

## 2022-11-20 DIAGNOSIS — Z7901 Long term (current) use of anticoagulants: Secondary | ICD-10-CM

## 2022-11-20 NOTE — Patient Outreach (Signed)
  Care Coordination   11/20/2022 Name: Johnny Oneill MRN: [id number] DOB: Sep 28, 1940   Care Coordination Outreach Attempts:  An unsuccessful telephone outreach was attempted today to offer the patient information about available care coordination services as a benefit of their health plan.   Follow Up Plan:  Additional outreach attempts will be made to offer the patient care coordination information and services.   Encounter Outcome:  No Answer   Care Coordination Interventions:  No, not indicated    SIG  Peter Garter RN, BSN,CCM, CDE Care Management Coordinator [facility] [phone]

## 2022-11-24 ENCOUNTER — Ambulatory Visit (HOSPITAL_COMMUNITY)
Admission: RE | Admit: 2022-11-24 | Discharge: 2022-11-24 | Disposition: A | Discharge status: Home / Self Care | Payer: Medicare Other | Source: Ambulatory Visit | Attending: Internal Medicine | Admitting: Internal Medicine

## 2022-11-24 ENCOUNTER — Encounter (HOSPITAL_COMMUNITY): Payer: Self-pay | Admitting: Internal Medicine

## 2022-11-24 ENCOUNTER — Ambulatory Visit (HOSPITAL_COMMUNITY): Payer: Self-pay | Admitting: Pharmacist

## 2022-11-24 VITALS — BP 130/0 | HR 79 | Wt 166.4 lb

## 2022-11-24 DIAGNOSIS — I951 Orthostatic hypotension: Secondary | ICD-10-CM | POA: Diagnosis not present

## 2022-11-24 DIAGNOSIS — D649 Anemia, unspecified: Secondary | ICD-10-CM | POA: Insufficient documentation

## 2022-11-24 DIAGNOSIS — I493 Ventricular premature depolarization: Secondary | ICD-10-CM | POA: Diagnosis not present

## 2022-11-24 DIAGNOSIS — Z95811 Presence of heart assist device: Secondary | ICD-10-CM

## 2022-11-24 DIAGNOSIS — N184 Chronic kidney disease, stage 4 (severe): Secondary | ICD-10-CM | POA: Diagnosis not present

## 2022-11-24 DIAGNOSIS — I4892 Unspecified atrial flutter: Secondary | ICD-10-CM | POA: Diagnosis not present

## 2022-11-24 DIAGNOSIS — I130 Hypertensive heart and chronic kidney disease with heart failure and stage 1 through stage 4 chronic kidney disease, or unspecified chronic kidney disease: Secondary | ICD-10-CM | POA: Diagnosis not present

## 2022-11-24 DIAGNOSIS — I5022 Chronic systolic (congestive) heart failure: Secondary | ICD-10-CM | POA: Insufficient documentation

## 2022-11-24 DIAGNOSIS — Z7982 Long term (current) use of aspirin: Secondary | ICD-10-CM | POA: Insufficient documentation

## 2022-11-24 DIAGNOSIS — Z7901 Long term (current) use of anticoagulants: Secondary | ICD-10-CM | POA: Diagnosis not present

## 2022-11-24 DIAGNOSIS — E039 Hypothyroidism, unspecified: Secondary | ICD-10-CM | POA: Insufficient documentation

## 2022-11-24 DIAGNOSIS — I255 Ischemic cardiomyopathy: Secondary | ICD-10-CM | POA: Insufficient documentation

## 2022-11-24 DIAGNOSIS — I2510 Atherosclerotic heart disease of native coronary artery without angina pectoris: Secondary | ICD-10-CM | POA: Diagnosis not present

## 2022-11-24 DIAGNOSIS — Z79899 Other long term (current) drug therapy: Secondary | ICD-10-CM | POA: Diagnosis not present

## 2022-11-24 DIAGNOSIS — I472 Ventricular tachycardia, unspecified: Secondary | ICD-10-CM | POA: Diagnosis not present

## 2022-11-24 DIAGNOSIS — Z95810 Presence of automatic (implantable) cardiac defibrillator: Secondary | ICD-10-CM | POA: Insufficient documentation

## 2022-11-24 DIAGNOSIS — I5082 Biventricular heart failure: Secondary | ICD-10-CM | POA: Insufficient documentation

## 2022-11-24 DIAGNOSIS — Z4659 Encounter for fitting and adjustment of other gastrointestinal appliance and device: Secondary | ICD-10-CM | POA: Diagnosis not present

## 2022-11-24 LAB — CBC
HCT: 39.1 % (ref 39.0–52.0)
Hemoglobin: 13 g/dL (ref 13.0–17.0)
MCH: 30 pg (ref 26.0–34.0)
MCHC: 33.2 g/dL (ref 30.0–36.0)
MCV: 90.3 fL (ref 80.0–100.0)
Platelets: 158 10*3/uL (ref 150–400)
RBC: 4.33 MIL/uL (ref 4.22–5.81)
RDW: 15.3 % (ref 11.5–15.5)
WBC: 5.6 10*3/uL (ref 4.0–10.5)
nRBC: 0 % (ref 0.0–0.2)

## 2022-11-24 LAB — BASIC METABOLIC PANEL
Anion gap: 9 (ref 5–15)
BUN: 28 mg/dL — ABNORMAL HIGH (ref 8–23)
CO2: 25 mmol/L (ref 22–32)
Calcium: 9 mg/dL (ref 8.9–10.3)
Chloride: 104 mmol/L (ref 98–111)
Creatinine, Ser: 2.18 mg/dL — ABNORMAL HIGH (ref 0.61–1.24)
GFR, Estimated: 30 mL/min — ABNORMAL LOW (ref >60)
Glucose, Bld: 106 mg/dL — ABNORMAL HIGH (ref 70–99)
Potassium: 4.1 mmol/L (ref 3.5–5.1)
Sodium: 138 mmol/L (ref 135–145)

## 2022-11-24 LAB — PROTIME-INR
INR: 2.9 — ABNORMAL HIGH (ref 0.8–1.2)
Prothrombin Time: 30.2 seconds — ABNORMAL HIGH (ref 11.4–15.2)

## 2022-11-24 LAB — LACTATE DEHYDROGENASE: LDH: 322 U/L — ABNORMAL HIGH (ref 98–192)

## 2022-11-24 NOTE — Progress Notes (Signed)
VAD CLINIC NOTE    HPI:  Johnny Oneill is a 82 y.o. 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   Presented to ER on 06/27/19 with syncopal episode and head injury from fall. ECG showed VT @ 240. Underwent emergent DC-CV in ER. Head CT no bleed. Admitted and had ICD gen replacement (EOL). -> Did not tolerate amio due to tremors.  Had ICD shock for VT on 06/09/20. Started on sotalol  On 02/06/21 Paced out of AFL.  Seen in ER 05/2021 due to LOW FLOW alarms. Found to be in VT rate 170. DCCV x 1 - returned to [place] with frequent PVCs. Pacer settings adjusted by [name] PA- DDD 90.  [place] 11/22 to assess for RV failure RA = 5 RV = 33/5 PA = 33/13 (20) PCW = 10 Fick cardiac output/index = 4.0/2.1 Thermo CO/CI = 4.9/2.6 PVR = 2.0 WU Ao sat = 95% PA sat = 54%, 59% PAPI = 4.0  Admitted 12/01/21 for R TKA. Did well with surgery but complicated by severe orthostatic hypotension. Midodrine added    Admitted 12/28/21 with recurrent VT. Had DC-CV in ER. Sotalol restarted. Continued to have elevated MAPs as well as orthostasis. Midodrine decreased and florinef added.   Admitted 02/09/22 with  a/c RV failure w/ cardiogenic shock. LFTs, INR, creatinine elevated on admit. Started on empiric milrinone with gradual improvement. LFTs/INR/creatinine improved. Had [place] primary left sided failure. LVAD speed optimized with speed increased to 9800.  Milrinone stopped without difficulty. INR followed closely by pharmacy with adjustments to coumadin.   Here for f/u. Doing great. Just got back from vacation in [place]. Dizziness resolved with taking midodrine 2x a day. Denies SOB, orthopnea, edema or PND. No ICD shocks. Denies orthopnea or PND. No fevers, chills or problems with driveline. No bleeding, melena or neuro symptoms. No VAD alarms. Taking all meds as prescribed.   In Clinic today found to have multiple  driveline fault notices on device interrogation. Company rep contacted and log files sent. Controller changed today but did not fix problem (see Coordinator's in-depth note)    VAD Indication: Destination therapy- patient choice     VAD interrogation & Equipment Management: Speed: 9800 Flow: 3.8 Power: 5.5 w    PI: 6.1   Alarms: Drive line Fault Events: 10-20 daily  Fixed speed: 9800  Low speed limit: 9200   Primary Controller:  Replace back up battery in 19 months Back up controller:  Replace back up battery in 6 months   Annual Equipment Maintenance on UBC/PM was performed 09/22/22   I reviewed the LVAD parameters from today and compared the results to the patient's prior recorded data. LVAD interrogation was NEGATIVE for significant power changes, NEGATIVE for clinical alarms and POSITIVE for PI events/speed drops. No programming changes were made and pump is functioning within specified parameters. Pt is performing daily controller and system monitor self tests along with completing weekly and monthly maintenance for LVAD equipmen  Past Medical History:  Diagnosis Date   Anxiety    Automatic implantable cardioverter-defibrillator in situ    CAD (coronary artery disease)    S/P stenting of LCX in 2004;  s/p Lat MI 2009 with occlusion of the LCX - treated with Promus stenting. Has total occlusion of the RCA.    CHF (congestive heart failure) (HCC)    Hypercholesteremia    Hypertension    Hypothyroidism  ICD (implantable cardiac defibrillator) in place    PROPHYLACTIC      medtronic   Inferior MI ([place]) "? date"; 2009   Ischemic cardiomyopathy    a. 10/09 Echo: Sev LV Dysfxn, inf/lat AK, Mod MR.   Liver disease    LVAD (left ventricular assist device) present ([place])    Osteoarthritis    a. s/p R TKR   Pacemaker    PVC (premature ventricular contraction)    RBBB    Shortness of breath     Current Outpatient Medications  Medication Sig Dispense Refill   acetaminophen  (TYLENOL) 500 MG tablet Take 1,000 mg by mouth every 6 (six) hours as needed (for pain or headaches).     aspirin EC 81 MG tablet Take 81 mg by mouth in the morning.     Cholecalciferol (VITAMIN D3) 125 MCG (5000 UT) CAPS Take 5,000 Units by mouth daily with breakfast.     citalopram (CELEXA) 10 MG tablet Take 1 tablet by mouth once daily 90 tablet 3   Coenzyme Q10 (COQ10) 200 MG CAPS Take 200 mg by mouth in the morning.     Famotidine (PEPCID PO) Take 10 mg by mouth every morning.     levothyroxine (SYNTHROID) 175 MCG tablet 1 tablet daily every morning except half tablet on Sundays (Patient taking differently: Take 87.5-175 mcg by mouth See admin instructions. Take 1 tablet daily every morning except half tablet on Sundays) 90 tablet 2   melatonin 5 MG TABS Take 5 mg by mouth at bedtime as needed (for sleep).     mexiletine (MEXITIL) 150 MG capsule Take 1 capsule by mouth twice daily 60 capsule 8   midodrine (PROAMATINE) 5 MG tablet Take 1 tablet (5 mg total) by mouth 2 (two) times daily as needed. Take 5 mg if doppler is 90 or less. 60 tablet 5   pantoprazole (PROTONIX) 40 MG tablet Take 1 tablet by mouth once daily 90 tablet 0   Probiotic Product (PROBIOTIC PO) Take 1 capsule by mouth in the morning.     promethazine (PHENERGAN) 12.5 MG tablet Take 1 tablet (12.5 mg total) by mouth every 6 (six) hours as needed for nausea or vomiting. 30 tablet 0   rosuvastatin (CRESTOR) 20 MG tablet Take 1 tablet (20 mg total) by mouth daily. (Patient taking differently: Take 20 mg by mouth every morning.) 90 tablet 3   sotalol (BETAPACE) 80 MG tablet Take 1 tablet by mouth once daily 90 tablet 0   traZODone (DESYREL) 50 MG tablet TAKE 1 TABLET BY MOUTH AT BEDTIME (Patient taking differently: Take 50 mg by mouth at bedtime.) 30 tablet 6   warfarin (COUMADIN) 5 MG tablet Take 5 mg (1 tablet) every day as directed by HF Clinic 135 tablet 11   doxazosin (CARDURA) 2 MG tablet Take 0.5 tablets (1 mg total) by mouth  2 (two) times daily. Take 0.5 tablet (1 mg total) by mouth 2 (two) times daily if Doppler BP 120 or greater. Check BP in the morning and evening. (Patient not taking: Reported on 09/22/2022) 30 tablet 5   fludrocortisone (FLORINEF) 0.1 MG tablet Take 1 tablet (0.1 mg total) by mouth daily. (Patient not taking: Reported on 09/22/2022) 30 tablet 3   furosemide (LASIX) 20 MG tablet Take 1 tablet (20 mg total) by mouth daily as needed (Take 20 mg as daily as needed for wt > 153 lbs). Reported on 01/14/2016 (Patient not taking: Reported on 09/22/2022) 30 tablet 6   traMADol (  ULTRAM) 50 MG tablet Take 1-2 tablets (50-100 mg total) by mouth every 6 (six) hours as needed for moderate pain. (Patient not taking: Reported on 09/22/2022) 40 tablet 5   No current facility-administered medications for this encounter.   Facility-Administered Medications Ordered in Other Encounters  Medication Dose Route Frequency Provider Last Rate Last Admin   etomidate (AMIDATE) injection    Anesthesia Intra-op Myna Bright, CRNA   20 mg at 02/08/14 0915   rocuronium [facility]) injection    Anesthesia Intra-op Myna Bright, CRNA   50 mg at 02/08/14 0932   succinylcholine (ANECTINE) injection    Anesthesia Intra-op Myna Bright, CRNA   100 mg at 02/08/14 0915   Ace inhibitors, Amiodarone, Diovan [valsartan], Lipitor [atorvastatin calcium], Norvasc [amlodipine], and Jardiance [empagliflozin]  Review of systems complete and found to be negative unless listed in HPI.     Vitals:   11/24/22 1252 11/24/22 1253  BP: (!) 130/104 (!) 130/0  Pulse: 79   SpO2: 99%     Wt Readings from Last 3 Encounters:  11/24/22 75.5 kg (166 lb 6.4 oz)  09/22/22 73.8 kg (162 lb 12.8 oz)  09/02/22 73.9 kg (163 lb)     Vital Signs:   Doppler Pressure: 130 Automatic BP: 130/104(114) HR: 79 SPO2: 99% on RA   Weight: 166.4 lb w/ eqt Last weight: 162.8 lb w/eqt   Physical Exam: General:  NAD.  HEENT: normal  Neck: supple.  JVP not elevated.  Carotids 2+ bilat; no bruits. No lymphadenopathy or thryomegaly appreciated. Cor: LVAD hum.  Lungs: Clear. Abdomen: soft, nontender, non-distended. No hepatosplenomegaly. No bruits or masses. Good bowel sounds. Driveline site clean. Anchor in place.  Extremities: no cyanosis, clubbing, rash. Warm no edema  Neuro: alert & oriented x 3. No focal deficits. 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 Company rep contacted and log files sent. Controller changed today but did not fix problem (see Coordinator's in-depth note)  - await further input from engineers - will likely need to treat as short-to-shield defect  2) HTN complicated by orthostatic hypotension - Much improved with close BP monitoring and midodrine  3) Chronic systolic HF: ICM, EF 0000000 s/p LVAD HM II implant 08/2013 for DT.   - Echo 3/18 EF 10-15% mild AI. Mod RV dysfunction. LVAD cannula OK - has struggled with RV failure however RHC 11/22 looked good (despite evidence of RVF on LE dopplers showing reflux - Muc improved NYHA II - Volume status looks good. Avoid volume depletion  -Failed Jardiance due to volume depletion - He has not tolerated ACE-I or amlodipine or b-blocker in the past.  4) LVAD: HMII 08/2013 for DT. - VAD interrogated personally. Parameters stable. See deiscussion above regarding DL fault notices - Continue aspirin and coumadin.  - LDH 222 - INR 2.9 Goal 2.0-2.5 Discussed dosing with PharmD personally. - DL site ok - MAPs ok  5) VT/Frequent PVCs - s/p ICD gen change.  - Off amio due to cerebellar side effects.  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 - VT  06/16/21 with  DC-CV x 1 - Recurrent VT 12/28/21 after stopping sotalol. Sotalol restarted. QT ok.  - PVCs improved on mexilitene. Continue mexilitene 150 bid - Keep K > 4.0 Mg > 2.0 - Continue to f/u with EP - Continue sotalol and mexilitene. QT  interval ok  - No recent VT  6) Atrial flutter,  recurrent - patient paced out of AFL in 6/22. Atrial therapies enabled  - continue sotalol (on for VT). QTs slightly prolonged but stable - IIn SR today  7) Chronic anticoagulation: INR goal 2.0-3.0.    - INR 2.7 today. Discussed dosing with PharmD personally. - Continue ASA 81 mg for LVAD + warfarin.   8) Hypothyroidism:  - Followed by Dr Dwyane Dee. 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 Hollie Salk  11) Anemia  - Hgb stable at 13.0 today  Total time spent 60 minutes. Over half that time spent discussing above.    Glori Bickers, MD  10:45 PM

## 2022-11-24 NOTE — Patient Instructions (Signed)
No medication changes today Appointment with [facility] 12/03/22 with Dr. Hollie Salk at 1050 am Coumadin dosing per Ander Purpura [facility]

## 2022-11-24 NOTE — Progress Notes (Addendum)
Patient presents for 2 month follow up in [facility] today alone. Reports no problems with VAD equipment or concerns with drive line.   Pt ambulated into clinic unassisted. Denies lightheadedness, dizziness, falls, shortness of breath, and signs of bleeding.Pt endorses good appetite and hydration. He remains active without limitation.  He reports he is taking 5 mg of Midodrine daily in the morning and PRN in the afternoon. His orthostatic symptoms have greatly improved since starting this. BP mildly elevated today; however, pt took dose of Midodrine prior to coming to appointment today. No changes today per Dr Haroldine Laws.   Dr Haroldine Laws referred to [facility] last visit for assistance in managing pt's kidney function. Pt has appointment scheduled 12/03/22 with Dr. Hollie Salk at 1050 am.  Upon interrogation of VAD intermittent [employer] noted. Pt states he has not had any alarms on his controller and is asymptomatic. There was no [employer] on controller. Log Files sent to [employer]. See below recommendations from Pensions consultant.    Controller changed out for [id number] Back up Battery: SN: [id number]   Drive line fault alarms did not resolve with controller change out. STAT KUB obtained of equipment and sent directly to [employer] along with log files from new controller. Please see interpretation below.    Advised by [employer] Clinical rep and Engineer that since patient is asymptomatic to send log files at follow up appointments to trend [employer] primary lead. Pt given ungrounded cable and loaner power module per Dr. Haroldine Laws for home use in case he experiences alarming on MPU. Pt instructed to call VAD Coordinator if any alarming occurs.   Ungrounded Cable: [id number] Loaner Power Module: [id number]  Vital Signs:   Doppler Pressure: 130 Automatic BP: 130/104(114) HR: 79 SPO2: 99% on RA   Weight: 166.4 lb w/ eqt Last weight: 162.8 lb w/eqt  VAD  Indication: Destination therapy- patient choice     VAD interrogation & Equipment Management: Speed: 9800 Flow: 3.8 Power: 5.5 w    PI: 6.1   Alarms: Drive line Fault Events: 10-20 daily  Fixed speed: 9800  Low speed limit: 9200   Primary Controller:  Replace back up battery in 19 months Back up controller:  Replace back up battery in 6 months   Annual Equipment Maintenance on UBC/PM was performed 09/22/22   I reviewed the LVAD parameters from today and compared the results to the patient's prior recorded data. LVAD interrogation was NEGATIVE for significant power changes, NEGATIVE for clinical alarms and POSITIVE for PI events/speed drops. No programming changes were made and pump is functioning within specified parameters. Pt is performing daily controller and system monitor self tests along with completing weekly and monthly maintenance for LVAD equipment.  Exit Site Care: Drive line is being maintained weekly by [name], his wife. Existing VAD dressing C/D/I. [name] states improvement with redness at site from gauze dressing. Pt has adequate dressing supplies at home.  Significant Events on VAD Support:  10/2013>SBO 12/2016> elevated LDH, admit for [place]   Device: Dual Medtronic Therapies:  on 231  Pacing: DDD 80 bpm  Last check:  09/24/2022  BP & Labs:  Doppler 130 - Doppler is reflecting modified systolic  Hgb 13 - No S/S of bleeding. Specifically denies melena/BRBPR or nosebleeds.   LDH 322- established baseline of 200- 350. Denies tea-colored urine. No power elevations noted on interrogation.   Patient Instructions:  No medication changes today Appointment with [facility] 12/03/22 with Dr. Hollie Salk  at 1050 am Please page VAD Coordinator if any alarms occur Coumadin dosing per Ander Purpura [facility]  Bobbye Morton RN,BSN [place] Coordinator  Office: [phone]  24/7 Pager: [phone]

## 2022-11-26 ENCOUNTER — Telehealth (HOSPITAL_COMMUNITY): Payer: Self-pay | Admitting: Unknown Physician Specialty

## 2022-11-26 NOTE — Telephone Encounter (Signed)
Pts wife called the VAD clinic this morning stating that the pt has a "sebaceous cyst" on his hand. Wife is asking if she should have pt see Dermatology about possible removal. Pt denies any pain in this area. Wife informed that she can make an appt with Derm but unsure if they will remove cyst as pt anticoagulated.  Tanda Rockers RN, BSN VAD Coordinator 24/7 Pager [phone]

## 2022-12-01 ENCOUNTER — Other Ambulatory Visit (HOSPITAL_COMMUNITY): Payer: Self-pay | Admitting: Internal Medicine

## 2022-12-01 DIAGNOSIS — K219 Gastro-esophageal reflux disease without esophagitis: Secondary | ICD-10-CM

## 2022-12-01 DIAGNOSIS — Z95811 Presence of heart assist device: Secondary | ICD-10-CM

## 2022-12-03 DIAGNOSIS — I5022 Chronic systolic (congestive) heart failure: Secondary | ICD-10-CM | POA: Diagnosis not present

## 2022-12-03 DIAGNOSIS — I951 Orthostatic hypotension: Secondary | ICD-10-CM | POA: Diagnosis not present

## 2022-12-03 DIAGNOSIS — Z7901 Long term (current) use of anticoagulants: Secondary | ICD-10-CM | POA: Diagnosis not present

## 2022-12-03 DIAGNOSIS — Z95811 Presence of heart assist device: Secondary | ICD-10-CM | POA: Diagnosis not present

## 2022-12-03 DIAGNOSIS — N184 Chronic kidney disease, stage 4 (severe): Secondary | ICD-10-CM | POA: Diagnosis not present

## 2022-12-04 ENCOUNTER — Other Ambulatory Visit: Payer: Self-pay | Admitting: Nephrology

## 2022-12-04 ENCOUNTER — Telehealth: Payer: Self-pay

## 2022-12-04 DIAGNOSIS — N184 Chronic kidney disease, stage 4 (severe): Secondary | ICD-10-CM

## 2022-12-04 DIAGNOSIS — Z95811 Presence of heart assist device: Secondary | ICD-10-CM

## 2022-12-04 DIAGNOSIS — I5022 Chronic systolic (congestive) heart failure: Secondary | ICD-10-CM

## 2022-12-04 DIAGNOSIS — Z7901 Long term (current) use of anticoagulants: Secondary | ICD-10-CM

## 2022-12-04 DIAGNOSIS — I951 Orthostatic hypotension: Secondary | ICD-10-CM

## 2022-12-04 NOTE — Patient Outreach (Signed)
  Care Coordination   12/04/2022 Name: Johnny Oneill MRN: [id number] DOB: [birth year]   Care Coordination Outreach Attempts:  A second unsuccessful outreach was attempted today to offer the patient with information about available care coordination services as a benefit of their health plan.     Follow Up Plan:  Additional outreach attempts will be made to offer the patient care coordination information and services.   Encounter Outcome:  No Answer   Care Coordination Interventions:  No, not indicated    SIG  Dudley Major RN, BSN,CCM, CDE Care Management Coordinator [facility] Care Management [phone]

## 2022-12-08 ENCOUNTER — Telehealth (HOSPITAL_COMMUNITY): Payer: Self-pay | Admitting: Unknown Physician Specialty

## 2022-12-08 NOTE — Telephone Encounter (Signed)
Pts wife called with concerns that pt is feeling more tired and SOB. VAD coord reached out to pt who tells [place] that he is feeling fine, denies feeling more tired or SOB. Pt states that he has been busy and just needs to rest. Pt informed to try and rest and to please call the VAD office if he feels more fatigued or SOB than normal. Pt assures me that he will call us.  Carlton Adam RN, BSN VAD Coordinator 24/7 Pager [phone]

## 2022-12-09 DIAGNOSIS — H903 Sensorineural hearing loss, bilateral: Secondary | ICD-10-CM | POA: Diagnosis not present

## 2022-12-09 DIAGNOSIS — H6123 Impacted cerumen, bilateral: Secondary | ICD-10-CM | POA: Diagnosis not present

## 2022-12-09 DIAGNOSIS — H838X3 Other specified diseases of inner ear, bilateral: Secondary | ICD-10-CM | POA: Diagnosis not present

## 2022-12-11 ENCOUNTER — Other Ambulatory Visit (HOSPITAL_COMMUNITY): Payer: Self-pay

## 2022-12-11 DIAGNOSIS — Z7901 Long term (current) use of anticoagulants: Secondary | ICD-10-CM

## 2022-12-11 DIAGNOSIS — Z95811 Presence of heart assist device: Secondary | ICD-10-CM

## 2022-12-13 ENCOUNTER — Other Ambulatory Visit (HOSPITAL_COMMUNITY): Payer: Self-pay | Admitting: Internal Medicine

## 2022-12-13 DIAGNOSIS — E785 Hyperlipidemia, unspecified: Secondary | ICD-10-CM

## 2022-12-13 DIAGNOSIS — I5043 Acute on chronic combined systolic (congestive) and diastolic (congestive) heart failure: Secondary | ICD-10-CM

## 2022-12-16 ENCOUNTER — Ambulatory Visit (HOSPITAL_COMMUNITY): Payer: Self-pay

## 2022-12-16 ENCOUNTER — Ambulatory Visit (HOSPITAL_COMMUNITY)
Admission: RE | Admit: 2022-12-16 | Discharge: 2022-12-16 | Disposition: A | Discharge status: Home / Self Care | Payer: Medicare Other | Source: Ambulatory Visit | Attending: Internal Medicine | Admitting: Internal Medicine

## 2022-12-16 DIAGNOSIS — Z95811 Presence of heart assist device: Secondary | ICD-10-CM | POA: Insufficient documentation

## 2022-12-16 DIAGNOSIS — Z7901 Long term (current) use of anticoagulants: Secondary | ICD-10-CM | POA: Diagnosis not present

## 2022-12-16 LAB — PROTIME-INR
INR: 2.4 — ABNORMAL HIGH (ref 0.8–1.2)
Prothrombin Time: 26.3 seconds — ABNORMAL HIGH (ref 11.4–15.2)

## 2022-12-17 ENCOUNTER — Telehealth: Payer: Self-pay

## 2022-12-17 NOTE — Patient Outreach (Signed)
  Care Coordination   Initial Visit Note   12/17/2022 Name: CHADWIN VIERGUTZ MRN: [id number] DOB: 29-Sep-1940  WALLIE GUY is a 82 y.o. year old male who sees Kirby Funk, MD for primary care. I spoke with  [name] by phone today.  What matters to the patients health and wellness today?  No concerns today. States that he is followed very closely at the LVAD clinic and has no issues    Goals Addressed             This Visit's Progress    COMPLETED: Care Coordination Activities- No Follow Up Required       Interventions Today    Flowsheet Row Most Recent Value  Chronic Disease   Chronic disease during today's visit Congestive Heart Failure (CHF)  General Interventions   General Interventions Discussed/Reviewed General Interventions Discussed, Doctor Visits  Doctor Visits Discussed/Reviewed Doctor Visits Discussed, Annual Wellness Visits, PCP, Specialist  PCP/Specialist Visits Compliance with follow-up visit  [Encouraged to re-establish with PCP for health maintance.  Pt is followed closely in LVAD clinic]  Education Interventions   Education Provided Provided Education  Provided Verbal Education On Other, When to see the doctor  [care coordination services]              SDOH assessments and interventions completed:  Yes  SDOH Interventions Today    Flowsheet Row Most Recent Value  SDOH Interventions   Food Insecurity Interventions Intervention Not Indicated  Housing Interventions Intervention Not Indicated  Transportation Interventions Intervention Not Indicated  Utilities Interventions Intervention Not Indicated        Care Coordination Interventions:  Yes, provided   Follow up plan: No further intervention required.   Encounter Outcome:  Pt. Visit Completed  Dudley Major RN, BSN,CCM, CDE Care Management Coordinator [facility] Care Management [phone]

## 2022-12-17 NOTE — Patient Instructions (Signed)
Visit Information  Thank you for taking time to visit with me today. Please don't hesitate to contact me if I can be of assistance to you.   Following are the goals we discussed today:   Goals Addressed             This Visit's Progress    COMPLETED: Care Coordination Activities- No Follow Up Required       Interventions Today    Flowsheet Row Most Recent Value  Chronic Disease   Chronic disease during today's visit Congestive Heart Failure (CHF)  General Interventions   General Interventions Discussed/Reviewed General Interventions Discussed, Doctor Visits  Doctor Visits Discussed/Reviewed Doctor Visits Discussed, Annual Wellness Visits, PCP, Specialist  PCP/Specialist Visits Compliance with follow-up visit  [Encouraged to re-establish with PCP for health maintance.  Pt is followed closely in LVAD clinic]  Education Interventions   Education Provided Provided Education  Provided Verbal Education On Other, When to see the doctor  [care coordination services]               If you are experiencing a Mental Health or Behavioral Health Crisis or need someone to talk to, please call the Suicide and Crisis Lifeline: 988 call the [place] National Suicide Prevention Lifeline: (954)613-1800 or TTY: 2013926260 TTY 515 767 7821) to talk to a trained counselor call 1-800-273-TALK (toll free, 24 hour hotline) go to [facility] [address] [place] [phone]) call 911   Patient verbalizes understanding of instructions and care plan provided today and agrees to view in MyChart. Active MyChart status and patient understanding of how to access instructions and care plan via MyChart confirmed with patient.     No further follow up required:    SIGNATURE Dudley Major RN, Maximiano Coss, CDE Care Management Coordinator [facility] Care Management [phone]

## 2022-12-17 NOTE — Patient Outreach (Signed)
  Care Coordination   12/17/2022 Name: Johnny Oneill MRN: [id number] DOB: November 12, 1940   Care Coordination Outreach Attempts:  A third unsuccessful outreach was attempted today to offer the patient with information about available care coordination services as a benefit of their health plan.   Follow Up Plan:  No further outreach attempts will be made at this time. We have been unable to contact the patient to offer or enroll patient in care coordination services  Encounter Outcome:  No Answer   Care Coordination Interventions:  No, not indicated    SIG  Dudley Major RN, BSN,CCM, CDE Care Management Coordinator [facility] Care Management [phone]

## 2022-12-23 ENCOUNTER — Ambulatory Visit
Admission: RE | Admit: 2022-12-23 | Discharge: 2022-12-23 | Disposition: A | Discharge status: Home / Self Care | Payer: Medicare Other | Source: Ambulatory Visit | Attending: Nephrology | Admitting: Nephrology

## 2022-12-23 DIAGNOSIS — N184 Chronic kidney disease, stage 4 (severe): Secondary | ICD-10-CM

## 2022-12-23 DIAGNOSIS — Z95811 Presence of heart assist device: Secondary | ICD-10-CM

## 2022-12-23 DIAGNOSIS — Z7901 Long term (current) use of anticoagulants: Secondary | ICD-10-CM

## 2022-12-23 DIAGNOSIS — N281 Cyst of kidney, acquired: Secondary | ICD-10-CM | POA: Diagnosis not present

## 2022-12-23 DIAGNOSIS — I951 Orthostatic hypotension: Secondary | ICD-10-CM

## 2022-12-23 DIAGNOSIS — N189 Chronic kidney disease, unspecified: Secondary | ICD-10-CM | POA: Diagnosis not present

## 2022-12-23 DIAGNOSIS — I5022 Chronic systolic (congestive) heart failure: Secondary | ICD-10-CM

## 2022-12-24 ENCOUNTER — Ambulatory Visit (INDEPENDENT_AMBULATORY_CARE_PROVIDER_SITE_OTHER): Payer: Medicare Other

## 2022-12-24 DIAGNOSIS — I255 Ischemic cardiomyopathy: Secondary | ICD-10-CM | POA: Diagnosis not present

## 2022-12-24 LAB — CUP PACEART REMOTE DEVICE CHECK
Battery Remaining Longevity: 41 mo
Battery Voltage: 2.96 V
Brady Statistic AP VP Percent: 90.06 %
Brady Statistic AP VS Percent: 5.38 %
Brady Statistic AS VP Percent: 3.57 %
Brady Statistic AS VS Percent: 1 %
Brady Statistic RA Percent Paced: 95.15 %
Brady Statistic RV Percent Paced: 93.45 %
Date Time Interrogation Session: 20240425022603
HighPow Impedance: 46 Ohm
HighPow Impedance: 62 Ohm
Implantable Lead Connection Status: 753985
Implantable Lead Connection Status: 753985
Implantable Lead Implant Date: 20091103
Implantable Lead Implant Date: 20091103
Implantable Lead Location: 753859
Implantable Lead Location: 753860
Implantable Lead Model: 5076
Implantable Lead Model: 6947
Implantable Pulse Generator Implant Date: 20201028
Lead Channel Impedance Value: 361 Ohm
Lead Channel Impedance Value: 456 Ohm
Lead Channel Impedance Value: 456 Ohm
Lead Channel Pacing Threshold Amplitude: 0.5 V
Lead Channel Pacing Threshold Amplitude: 1.375 V
Lead Channel Pacing Threshold Pulse Width: 0.4 ms
Lead Channel Pacing Threshold Pulse Width: 0.4 ms
Lead Channel Sensing Intrinsic Amplitude: 2.125 mV
Lead Channel Sensing Intrinsic Amplitude: 2.125 mV
Lead Channel Sensing Intrinsic Amplitude: 7 mV
Lead Channel Sensing Intrinsic Amplitude: 7 mV
Lead Channel Setting Pacing Amplitude: 1.5 V
Lead Channel Setting Pacing Amplitude: 2.75 V
Lead Channel Setting Pacing Pulse Width: 0.6 ms
Lead Channel Setting Sensing Sensitivity: 0.3 mV

## 2023-01-01 ENCOUNTER — Other Ambulatory Visit (HOSPITAL_COMMUNITY): Payer: Self-pay | Admitting: Unknown Physician Specialty

## 2023-01-01 DIAGNOSIS — Z95811 Presence of heart assist device: Secondary | ICD-10-CM

## 2023-01-01 DIAGNOSIS — Z7901 Long term (current) use of anticoagulants: Secondary | ICD-10-CM

## 2023-01-06 ENCOUNTER — Ambulatory Visit (HOSPITAL_COMMUNITY): Payer: Self-pay | Admitting: Pharmacist

## 2023-01-06 ENCOUNTER — Ambulatory Visit (HOSPITAL_COMMUNITY)
Admission: RE | Admit: 2023-01-06 | Discharge: 2023-01-06 | Disposition: A | Discharge status: Home / Self Care | Payer: Medicare Other | Source: Ambulatory Visit | Attending: Internal Medicine | Admitting: Internal Medicine

## 2023-01-06 DIAGNOSIS — Z95811 Presence of heart assist device: Secondary | ICD-10-CM | POA: Diagnosis not present

## 2023-01-06 DIAGNOSIS — Z7901 Long term (current) use of anticoagulants: Secondary | ICD-10-CM | POA: Diagnosis not present

## 2023-01-06 LAB — PROTIME-INR
INR: 2.5 — ABNORMAL HIGH (ref 0.8–1.2)
Prothrombin Time: 27.5 seconds — ABNORMAL HIGH (ref 11.4–15.2)

## 2023-01-15 ENCOUNTER — Other Ambulatory Visit: Payer: Medicare Other

## 2023-01-17 ENCOUNTER — Other Ambulatory Visit (HOSPITAL_COMMUNITY): Payer: Self-pay | Admitting: Internal Medicine

## 2023-01-17 DIAGNOSIS — Z95811 Presence of heart assist device: Secondary | ICD-10-CM

## 2023-01-17 DIAGNOSIS — Z7901 Long term (current) use of anticoagulants: Secondary | ICD-10-CM

## 2023-01-17 DIAGNOSIS — I472 Ventricular tachycardia, unspecified: Secondary | ICD-10-CM

## 2023-01-18 ENCOUNTER — Other Ambulatory Visit: Payer: Self-pay

## 2023-01-18 ENCOUNTER — Other Ambulatory Visit: Payer: Medicare Other

## 2023-01-18 ENCOUNTER — Other Ambulatory Visit (INDEPENDENT_AMBULATORY_CARE_PROVIDER_SITE_OTHER): Payer: Medicare Other

## 2023-01-18 DIAGNOSIS — E890 Postprocedural hypothyroidism: Secondary | ICD-10-CM

## 2023-01-19 ENCOUNTER — Ambulatory Visit (INDEPENDENT_AMBULATORY_CARE_PROVIDER_SITE_OTHER): Payer: Medicare Other | Admitting: Endocrinology

## 2023-01-19 ENCOUNTER — Encounter: Payer: Self-pay | Admitting: Endocrinology

## 2023-01-19 VITALS — BP 122/70 | HR 83 | Ht 67.0 in | Wt 162.8 lb

## 2023-01-19 DIAGNOSIS — E890 Postprocedural hypothyroidism: Secondary | ICD-10-CM | POA: Diagnosis not present

## 2023-01-19 LAB — T4, FREE: Free T4: 1.2 ng/dL (ref 0.60–1.60)

## 2023-01-19 LAB — TSH: TSH: 0.91 u[IU]/mL (ref 0.35–5.50)

## 2023-01-19 NOTE — Progress Notes (Signed)
Patient ID: Johnny Oneill, male   DOB: 07-20-1941, 81 y.o.   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  Previously was taking 200 mcg daily after 3/15 when he was hospitalized   Subsequently the dose was adjusted periodically    Current regimen: he has been taking 137 mcg daily, previously was taking 6-1/2 tablets a week Taking EUTHYROX brand previously but now taking generic levothyroxine since December 2022   Usually with abnormal thyroid functions he does not complain of fatigue, lethargy or cold intolerance Again feels fairly good recently and is physically active despite his cardiac issues  His current dose is 175 mcg levothyroxine, 6-1/2 tablets a week He is very regular with taking the tablet in the morning daily 30 minutes before breakfast   Does not miss any doses and is getting his pills in a pillbox Does not take any iron or calcium-containing vitamins at the same time  His TSH which was previously unusually high at 20.2 is now normal  Free T4 relatively higher  Wt Readings from Last 3 Encounters:  01/19/23 162 lb 12.8 oz (73.8 kg)  11/24/22 166 lb 6.4 oz (75.5 kg)  09/22/22 162 lb 12.8 oz (73.8 kg)    Lab Results  Component Value Date   TSH 0.91 01/18/2023   TSH 4.551 (H) 02/09/2022   TSH 4.189 11/24/2021   FREET4 1.20 01/18/2023   FREET4 0.93 10/06/2021   FREET4 1.14 03/28/2021    Allergies as of 01/19/2023       Reactions   Ace Inhibitors Other (See Comments)   Hypotension and elevated creatinine   Amiodarone Other (See Comments)   Cerebellar side effects   Diovan [valsartan] Other  (See Comments)   Hypotension at low dose, hyperkalemia   Lipitor [atorvastatin Calcium] Other (See Comments)   Muscle pain   Norvasc [amlodipine] Other (See Comments)   hypotension   Jardiance [empagliflozin] Nausea And Vomiting        Medication List        Accurate as of Jan 19, 2023 10:55 AM. If you have any questions, ask your nurse or doctor.          acetaminophen 500 MG tablet Commonly known as: TYLENOL Take 1,000 mg by mouth every 6 (six) hours as needed (for pain or headaches).   aspirin EC 81 MG tablet Take 81 mg by mouth in the morning.   citalopram 10 MG tablet Commonly known as: CELEXA Take 1 tablet by mouth once daily   CoQ10 200 MG Caps Take 200 mg by mouth in the morning.   doxazosin 2 MG tablet Commonly known as: Cardura Take 0.5 tablets (1 mg total) by mouth 2 (two) times daily. Take 0.5 tablet (1 mg total) by mouth 2 (two) times daily if Doppler BP 120 or greater. Check BP in the morning and evening.   fludrocortisone 0.1 MG tablet Commonly known as: FLORINEF Take 1 tablet (0.1 mg total) by mouth daily.   furosemide 20 MG tablet Commonly known as: LASIX Take 1 tablet (20 mg total) by mouth daily as needed (Take 20 mg as daily as needed for wt > 153 lbs).  Reported on 01/14/2016   levothyroxine 175 MCG tablet Commonly known as: SYNTHROID 1 tablet daily every morning except half tablet on Sundays What changed:  how much to take how to take this when to take this additional instructions   melatonin 5 MG Tabs Take 5 mg by mouth at bedtime as needed (for sleep).   mexiletine 150 MG capsule Commonly known as: MEXITIL Take 1 capsule by mouth twice daily   midodrine 5 MG tablet Commonly known as: PROAMATINE Take 1 tablet (5 mg total) by mouth 2 (two) times daily as needed. Take 5 mg if doppler is 90 or less.   pantoprazole 40 MG tablet Commonly known as: PROTONIX Take 1 tablet by mouth once daily   PEPCID PO Take 10 mg by mouth every  morning.   PROBIOTIC PO Take 1 capsule by mouth in the morning.   promethazine 12.5 MG tablet Commonly known as: PHENERGAN Take 1 tablet (12.5 mg total) by mouth every 6 (six) hours as needed for nausea or vomiting.   rosuvastatin 20 MG tablet Commonly known as: CRESTOR Take 1 tablet by mouth once daily   sotalol 80 MG tablet Commonly known as: BETAPACE Take 1 tablet by mouth once daily   traMADol 50 MG tablet Commonly known as: ULTRAM Take 1-2 tablets (50-100 mg total) by mouth every 6 (six) hours as needed for moderate pain.   traZODone 50 MG tablet Commonly known as: DESYREL TAKE 1 TABLET BY MOUTH AT BEDTIME   Vitamin D3 125 MCG (5000 UT) Caps Take 5,000 Units by mouth daily with breakfast.   warfarin 5 MG tablet Commonly known as: COUMADIN Take as directed by the anticoagulation clinic. If you are unsure how to take this medication, talk to your nurse or doctor. Original instructions: TAKE 1 TABLET BY MOUTH ONCE DAILY AS DIRECTED BY HF CLINIC         Past Medical History:  Diagnosis Date   Anxiety    Automatic implantable cardioverter-defibrillator in situ    CAD (coronary artery disease)    S/P stenting of LCX in 2004;  s/p Lat MI 2009 with occlusion of the LCX - treated with Promus stenting. Has total occlusion of the RCA.    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 10/09 Echo: Sev LV Dysfxn, inf/lat AK, Mod MR.   Liver disease    LVAD (left ventricular assist device) present (HCC)    Osteoarthritis    a. s/p R TKR   Pacemaker    PVC (premature ventricular contraction)    RBBB    Shortness of breath     Past Surgical History:  Procedure Laterality Date   CARPAL TUNNEL RELEASE Right 05/29/2016   Procedure: CARPAL TUNNEL RELEASE;  Surgeon: Frederico Hamman, MD;  Location: [facility] OR;  Service: Orthopedics;  Laterality: Right;   CORONARY ANGIOPLASTY WITH STENT PLACEMENT  2004   CORONARY ANGIOPLASTY WITH STENT PLACEMENT  07/2008   DEFIBRILLATOR  2009   ESOPHAGOGASTRODUODENOSCOPY N/A 09/15/2013   Procedure: ESOPHAGOGASTRODUODENOSCOPY (EGD);  Surgeon: Meryl Dare, MD;  Location: [facility] ENDOSCOPY;  Service: Endoscopy;  Laterality: N/A;  bedside   HEMATOMA EVACUATION Right 11/06/2013   Procedure: EVACUATION HEMATOMA;  Surgeon: Alleen Borne, MD;  Location: [facility] OR;  Service: Cardiothoracic;  Laterality: Right;   ICD GENERATOR CHANGEOUT N/A 06/28/2019   Procedure: ICD GENERATOR CHANGEOUT;  Surgeon: Regan Lemming, MD;  Location: [facility] INVASIVE CV LAB;  Service: Cardiovascular;  Laterality: N/A;   INSERT / REPLACE / REMOVE PACEMAKER  2009   INSERTION OF IMPLANTABLE LEFT VENTRICULAR ASSIST DEVICE N/A 09/18/2013   Procedure: INSERTION OF IMPLANTABLE LEFT VENTRICULAR ASSIST DEVICE;  Surgeon: Kerin Perna, MD;  Location: [facility] OR;  Service: Open Heart Surgery;  Laterality: N/A;  CIRC ARREST  NITRIC OXIDE   INTRA-AORTIC BALLOON PUMP INSERTION N/A 09/14/2013   Procedure: INTRA-AORTIC BALLOON PUMP INSERTION;  Surgeon: Dolores Patty, MD;  Location: [facility] CATH LAB;  Service: Cardiovascular;  Laterality: N/A;   INTRAOPERATIVE TRANSESOPHAGEAL ECHOCARDIOGRAM N/A 09/18/2013   Procedure: INTRAOPERATIVE TRANSESOPHAGEAL ECHOCARDIOGRAM;  Surgeon: Kerin Perna, MD;  Location: [facility] OR;  Service: Open Heart Surgery;  Laterality: N/A;   KNEE ARTHROSCOPY     bilaterally   KNEE ARTHROSCOPY W/ PARTIAL MEDIAL MENISCECTOMY  12/2002   left   LEFT VENTRICULAR ASSIST DEVICE     RIGHT HEART CATH N/A 07/28/2021   Procedure: RIGHT HEART CATH;  Surgeon: Dolores Patty, MD;  Location: [facility] INVASIVE CV LAB;  Service: Cardiovascular;  Laterality: N/A;   RIGHT HEART CATH N/A 02/12/2022   Procedure: RIGHT HEART CATH;  Surgeon: Dolores Patty, MD;  Location: [facility] INVASIVE CV LAB;   Service: Cardiovascular;  Laterality: N/A;   RIGHT HEART CATHETERIZATION N/A 08/25/2013   Procedure: RIGHT HEART CATH;  Surgeon: Dolores Patty, MD;  Location: [facility] CATH LAB;  Service: Cardiovascular;  Laterality: N/A;   RIGHT HEART CATHETERIZATION N/A 09/13/2013   Procedure: RIGHT HEART CATH;  Surgeon: Dolores Patty, MD;  Location: [facility] CATH LAB;  Service: Cardiovascular;  Laterality: N/A;   RIGHT HEART CATHETERIZATION N/A 02/08/2014   Procedure: RIGHT HEART CATH;  Surgeon: Dolores Patty, MD;  Location: [facility] CATH LAB;  Service: Cardiovascular;  Laterality: N/A;   RIGHT HEART CATHETERIZATION N/A 02/12/2014   Procedure: RIGHT HEART CATH;  Surgeon: Dolores Patty, MD;  Location: [facility] CATH LAB;  Service: Cardiovascular;  Laterality: N/A;   TOTAL KNEE ARTHROPLASTY  11/27/11   left   TOTAL KNEE ARTHROPLASTY  11/27/2011   Procedure: TOTAL KNEE ARTHROPLASTY;  Surgeon: Thera Flake., MD;  Location: [facility] OR;  Service: Orthopedics;  Laterality: Left;  left total knee arthroplasty   TOTAL KNEE ARTHROPLASTY Right 12/03/2021   Procedure: TOTAL KNEE ARTHROPLASTY;  Surgeon: Frederico Hamman, MD;  Location: [facility] OR;  Service: Orthopedics;  Laterality: Right;   VIDEO ASSISTED THORACOSCOPY Right 11/06/2013   Procedure: VIDEO ASSISTED THORACOSCOPY;  Surgeon: Alleen Borne, MD;  Location: [facility] OR;  Service: Cardiothoracic;  Laterality: Right;    Family History  Problem Relation Age of Onset   Heart failure Father    Stroke Father    Coronary artery disease Mother    Heart disease Mother    Hyperlipidemia Sister     Social History:  reports that he has never smoked. He has never used smokeless tobacco. He reports that he does not drink alcohol and does not use drugs.  Allergies:  Allergies  Allergen Reactions   Ace Inhibitors Other (See Comments)    Hypotension and elevated creatinine   Amiodarone Other (See Comments)    Cerebellar side effects   Diovan [Valsartan] Other (See Comments)    Hypotension  at low dose, hyperkalemia   Lipitor [Atorvastatin Calcium] Other (See Comments)  Muscle pain   Norvasc [Amlodipine] Other (See Comments)    hypotension   Jardiance [Empagliflozin] Nausea And Vomiting   ROS:  Cardiomyopathy: He has been treated with a ventricle assist device and he is on Coumadin     Examination:   BP 122/70   Pulse 83   Ht 5\' 7"  (1.702 m)   Wt 162 lb 12.8 oz (73.8 kg)   SpO2 99%   BMI 25.50 kg/m   Exam not indicated    Assessment/Plan:   Post ablative Hypothyroidism:   He is taking levothyroxine 175 mcg, 6-1/2 days a week of the levothyroxine generic from [facility] Has been regular with his supplement As before he is usually not symptomatic with either unusually high or low TSH levels  TSH normal  Follow-up in 6 months and reminded him to make sure he keeps his appointments  There are no Patient Instructions on file for this visit.    Reather Littler 01/19/2023, 10:55 AM

## 2023-01-19 NOTE — Progress Notes (Signed)
Remote ICD transmission.   

## 2023-01-22 ENCOUNTER — Other Ambulatory Visit (HOSPITAL_COMMUNITY): Payer: Self-pay

## 2023-01-22 DIAGNOSIS — Z7901 Long term (current) use of anticoagulants: Secondary | ICD-10-CM

## 2023-01-22 DIAGNOSIS — Z95811 Presence of heart assist device: Secondary | ICD-10-CM

## 2023-01-26 ENCOUNTER — Ambulatory Visit (HOSPITAL_COMMUNITY)
Admission: RE | Admit: 2023-01-26 | Discharge: 2023-01-26 | Disposition: A | Discharge status: Home / Self Care | Payer: Medicare Other | Source: Ambulatory Visit | Attending: Internal Medicine | Admitting: Internal Medicine

## 2023-01-26 ENCOUNTER — Ambulatory Visit (HOSPITAL_COMMUNITY): Payer: Self-pay | Admitting: Pharmacist

## 2023-01-26 DIAGNOSIS — Z7901 Long term (current) use of anticoagulants: Secondary | ICD-10-CM | POA: Diagnosis not present

## 2023-01-26 DIAGNOSIS — Z95811 Presence of heart assist device: Secondary | ICD-10-CM | POA: Diagnosis not present

## 2023-01-26 LAB — PROTIME-INR
INR: 2.3 — ABNORMAL HIGH (ref 0.8–1.2)
Prothrombin Time: 25.8 seconds — ABNORMAL HIGH (ref 11.4–15.2)

## 2023-01-28 ENCOUNTER — Encounter (HOSPITAL_COMMUNITY): Payer: Medicare Other | Admitting: Internal Medicine

## 2023-02-16 ENCOUNTER — Other Ambulatory Visit (HOSPITAL_COMMUNITY): Payer: Self-pay | Admitting: *Deleted

## 2023-02-16 DIAGNOSIS — Z7901 Long term (current) use of anticoagulants: Secondary | ICD-10-CM

## 2023-02-16 DIAGNOSIS — Z95811 Presence of heart assist device: Secondary | ICD-10-CM

## 2023-02-17 ENCOUNTER — Ambulatory Visit (HOSPITAL_COMMUNITY)
Admission: RE | Admit: 2023-02-17 | Discharge: 2023-02-17 | Disposition: A | Discharge status: Home / Self Care | Payer: Medicare Other | Source: Ambulatory Visit | Attending: Internal Medicine | Admitting: Internal Medicine

## 2023-02-17 ENCOUNTER — Encounter (HOSPITAL_COMMUNITY): Payer: Self-pay | Admitting: Internal Medicine

## 2023-02-17 ENCOUNTER — Ambulatory Visit (HOSPITAL_COMMUNITY): Payer: Self-pay | Admitting: Pharmacist

## 2023-02-17 VITALS — BP 110/0 | HR 81 | Wt 162.4 lb

## 2023-02-17 DIAGNOSIS — I130 Hypertensive heart and chronic kidney disease with heart failure and stage 1 through stage 4 chronic kidney disease, or unspecified chronic kidney disease: Secondary | ICD-10-CM | POA: Diagnosis not present

## 2023-02-17 DIAGNOSIS — Z79899 Other long term (current) drug therapy: Secondary | ICD-10-CM | POA: Diagnosis not present

## 2023-02-17 DIAGNOSIS — Z7982 Long term (current) use of aspirin: Secondary | ICD-10-CM | POA: Insufficient documentation

## 2023-02-17 DIAGNOSIS — E039 Hypothyroidism, unspecified: Secondary | ICD-10-CM | POA: Diagnosis not present

## 2023-02-17 DIAGNOSIS — R42 Dizziness and giddiness: Secondary | ICD-10-CM | POA: Diagnosis not present

## 2023-02-17 DIAGNOSIS — N184 Chronic kidney disease, stage 4 (severe): Secondary | ICD-10-CM

## 2023-02-17 DIAGNOSIS — I5022 Chronic systolic (congestive) heart failure: Secondary | ICD-10-CM | POA: Diagnosis not present

## 2023-02-17 DIAGNOSIS — I4892 Unspecified atrial flutter: Secondary | ICD-10-CM | POA: Insufficient documentation

## 2023-02-17 DIAGNOSIS — Z95811 Presence of heart assist device: Secondary | ICD-10-CM | POA: Diagnosis not present

## 2023-02-17 DIAGNOSIS — Z7901 Long term (current) use of anticoagulants: Secondary | ICD-10-CM

## 2023-02-17 DIAGNOSIS — I2510 Atherosclerotic heart disease of native coronary artery without angina pectoris: Secondary | ICD-10-CM | POA: Insufficient documentation

## 2023-02-17 DIAGNOSIS — R55 Syncope and collapse: Secondary | ICD-10-CM

## 2023-02-17 DIAGNOSIS — I472 Ventricular tachycardia, unspecified: Secondary | ICD-10-CM

## 2023-02-17 DIAGNOSIS — I493 Ventricular premature depolarization: Secondary | ICD-10-CM | POA: Insufficient documentation

## 2023-02-17 LAB — CBC
HCT: 38.8 % — ABNORMAL LOW (ref 39.0–52.0)
Hemoglobin: 12.8 g/dL — ABNORMAL LOW (ref 13.0–17.0)
MCH: 30.2 pg (ref 26.0–34.0)
MCHC: 33 g/dL (ref 30.0–36.0)
MCV: 91.5 fL (ref 80.0–100.0)
Platelets: 179 10*3/uL (ref 150–400)
RBC: 4.24 MIL/uL (ref 4.22–5.81)
RDW: 15.8 % — ABNORMAL HIGH (ref 11.5–15.5)
WBC: 6 10*3/uL (ref 4.0–10.5)
nRBC: 0 % (ref 0.0–0.2)

## 2023-02-17 LAB — BASIC METABOLIC PANEL
Anion gap: 9 (ref 5–15)
BUN: 43 mg/dL — ABNORMAL HIGH (ref 8–23)
CO2: 23 mmol/L (ref 22–32)
Calcium: 9 mg/dL (ref 8.9–10.3)
Chloride: 103 mmol/L (ref 98–111)
Creatinine, Ser: 2.38 mg/dL — ABNORMAL HIGH (ref 0.61–1.24)
GFR, Estimated: 27 mL/min — ABNORMAL LOW (ref >60)
Glucose, Bld: 76 mg/dL (ref 70–99)
Potassium: 4.8 mmol/L (ref 3.5–5.1)
Sodium: 135 mmol/L (ref 135–145)

## 2023-02-17 LAB — LACTATE DEHYDROGENASE: LDH: 321 U/L — ABNORMAL HIGH (ref 98–192)

## 2023-02-17 LAB — MAGNESIUM: Magnesium: 2.2 mg/dL (ref 1.7–2.4)

## 2023-02-17 LAB — PROTIME-INR
INR: 2.3 — ABNORMAL HIGH (ref 0.8–1.2)
Prothrombin Time: 25.3 seconds — ABNORMAL HIGH (ref 11.4–15.2)

## 2023-02-17 NOTE — Progress Notes (Addendum)
Patient presents for 2 month follow up in VAD Clinic today alone. Reports no problems with VAD equipment or concerns with drive line.   Pt ambulated into clinic unassisted. Denies lightheadedness, dizziness, falls, shortness of breath, and signs of bleeding. Pt endorses good appetite and hydration. He remains active without limitation.   He reports he is taking 5 mg of Midodrine daily in the morning and PRN in the afternoon. His orthostatic symptoms have greatly improved since starting this. No changes today per Dr Gala Romney.   Pt seen by Dr. Signe Colt at [facility] for assistance in managing pt's kidney function. Pt states no new medication were prescribed and he was advised to avoid dark colored drinks such as tea and cola. Pt states he has been compliant with this and has follow up scheduled in 4 months with Dr. Signe Colt.   [employer] noted last visit. Advised by [employer] Clinical rep and Engineer that since patient is asymptomatic to send log files at follow up appointments to [facility] primary lead. Log files sent to [employer] today. Pt states he has not had any alarms on his controller and remains asymptomatic. Pt instructed again to call VAD Coordinator if any alarming occurs.   Analysis of Log Files from [employer]: The log displayed (16) intermittent Driveline Fault Alarms during the ~3-day length of the log file history. It appears there is a potential slight degrading conductor/wire connection in Phase A within the driveline. Please monitor for reoccurrence of further Driveline Fault Alarms  Vital Signs:   Doppler Pressure: 110 Automatic BP: 121/96 (106) HR: 81 SPO2: 99% on RA   Weight: 162.4 lb w/ eqt Last weight: 166.4 lb w/eqt  VAD Indication: Destination therapy- patient choice     VAD interrogation & Equipment Management: Speed: 9800 Flow: 4.0 Power: 5.6 w    PI: 4.4   Alarms: Drive line Fault Events: 16-10 daily  Fixed speed: 9800  Low speed limit: 9200   Primary  Controller:  Replace back up battery in 18 months Back up controller:  Replace back up battery in 6 months   Annual Equipment Maintenance on UBC/PM was performed 09/22/22   I reviewed the LVAD parameters from today and compared the results to the patient's prior recorded data. LVAD interrogation was NEGATIVE for significant power changes, NEGATIVE for clinical alarms and POSITIVE for PI events/speed drops. No programming changes were made and pump is functioning within specified parameters. Pt is performing daily controller and system monitor self tests along with completing weekly and monthly maintenance for LVAD equipment.  Exit Site Care: Drive line is being maintained weekly by [name], his wife. Existing VAD dressing C/D/I. Pt given 8 weekly kits for at home use.  Significant Events on VAD Support:  10/2013>SBO 12/2016> elevated LDH, admit for [place]   Device: Dual Medtronic Therapies:  on 231  Pacing: DDD 80 bpm  Last check:  12/24/2022  BP & Labs:  Doppler 110 - Doppler is reflecting modified systolic  Hgb 12.8 - No S/S of bleeding. Specifically denies melena/BRBPR or nosebleeds.   LDH 321- established baseline of 200- 350. Denies tea-colored urine. No power elevations noted on interrogation.   Patient Instructions:  No medication changes today Please page VAD Coordinator if any alarms occur Coumadin dosing per Lauren [facility] Follow up in VAD Clinic in 2 months  Simmie Davies RN,BSN VAD Coordinator  Office: [phone]  24/7 Pager: [phone]

## 2023-02-17 NOTE — Patient Instructions (Signed)
No medication changes today Please page VAD Coordinator if any alarms occur Coumadin dosing per Leotis Shames [facility]

## 2023-02-17 NOTE — Progress Notes (Signed)
VAD CLINIC NOTE    HPI:  Johnny Oneill is a 82 y.o. 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   Presented to ER on 06/27/19 with syncopal episode and head injury from fall. ECG showed VT @ 240. Underwent emergent DC-CV in ER. Head CT no bleed. Admitted and had ICD gen replacement (EOL). -> Did not tolerate amio due to tremors.  Had ICD shock for VT on 06/09/20. Started on sotalol  On 02/06/21 Paced out of AFL.  Seen in ER 05/2021 due to LOW FLOW alarms. Found to be in VT rate 170. DCCV x 1 - returned to SR with frequent PVCs. Pacer settings adjusted by Otilio Saber PA- DDD 90.  RHC 11/22 to assess for RV failure RA = 5 RV = 33/5 PA = 33/13 (20) PCW = 10 Fick cardiac output/index = 4.0/2.1 Thermo CO/CI = 4.9/2.6 PVR = 2.0 WU Ao sat = 95% PA sat = 54%, 59% PAPI = 4.0  Admitted 12/01/21 for R TKA. Did well with surgery but complicated by severe orthostatic hypotension. Midodrine added    Admitted 12/28/21 with recurrent VT. Had DC-CV in ER. Sotalol restarted. Continued to have elevated MAPs as well as orthostasis. Midodrine decreased and florinef added.   Admitted 02/09/22 with  a/c RV failure w/ cardiogenic shock. LFTs, INR, creatinine elevated on admit. Started on empiric milrinone with gradual improvement. LFTs/INR/creatinine improved. Had RHC primary left sided failure. LVAD speed optimized with speed increased to 9800.  Milrinone stopped without difficulty. INR followed closely by pharmacy with adjustments to coumadin.   Here for f/u. Doing great. Remains active. No CP or SOB. No VT or ICD shocks. BP stable on 1 midodrine tablet per day. No syncope. Denies orthopnea or PND. No fevers, chills or problems with driveline. No bleeding, melena or neuro symptoms. No VAD alarms. Taking all meds as prescribed.      VAD Indication: Destination therapy- patient choice     VAD interrogation &  Equipment Management: Speed: 9800 Flow: 4.0 Power: 5.6 w    PI: 4.4   Alarms: Drive line Fault Events: 47-82 daily  Fixed speed: 9800  Low speed limit: 9200   Primary Controller:  Replace back up battery in 18 months Back up controller:  Replace back up battery in 6 months   Annual Equipment Maintenance on UBC/PM was performed 09/22/22   I reviewed the LVAD parameters from today and compared the results to the patient's prior recorded data. LVAD interrogation was NEGATIVE for significant power changes, NEGATIVE for clinical alarms and POSITIVE for PI events/speed drops. No programming changes were made and pump is functioning within specified parameters. Pt is performing daily controller and system monitor self tests along with completing weekly and monthly maintenance for LVAD equipment.  Past Medical History:  Diagnosis Date   Anxiety    Automatic implantable cardioverter-defibrillator in situ    CAD (coronary artery disease)    S/P stenting of LCX in 2004;  s/p Lat MI 2009 with occlusion of the LCX - treated with Promus stenting. Has total occlusion of the RCA.    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  10/09 Echo: Sev LV Dysfxn, inf/lat AK, Mod MR.   Liver disease    LVAD (left ventricular assist device) present (HCC)    Osteoarthritis    a. s/p R TKR   Pacemaker    PVC (premature ventricular contraction)    RBBB    Shortness of breath     Current Outpatient Medications  Medication Sig Dispense Refill   acetaminophen (TYLENOL) 500 MG tablet Take 1,000 mg by mouth every 6 (six) hours as needed (for pain or headaches).     aspirin EC 81 MG tablet Take 81 mg by mouth in the morning.     Cholecalciferol (VITAMIN D3) 125 MCG (5000 UT) CAPS Take 5,000 Units by mouth daily  with breakfast.     citalopram (CELEXA) 10 MG tablet Take 1 tablet by mouth once daily 90 tablet 3   Coenzyme Q10 (COQ10) 200 MG CAPS Take 200 mg by mouth in the morning.     doxazosin (CARDURA) 2 MG tablet Take 0.5 tablets (1 mg total) by mouth 2 (two) times daily. Take 0.5 tablet (1 mg total) by mouth 2 (two) times daily if Doppler BP 120 or greater. Check BP in the morning and evening. 30 tablet 5   Famotidine (PEPCID PO) Take 10 mg by mouth every morning.     furosemide (LASIX) 20 MG tablet Take 1 tablet (20 mg total) by mouth daily as needed (Take 20 mg as daily as needed for wt > 153 lbs). Reported on 01/14/2016 30 tablet 6   levothyroxine (SYNTHROID) 175 MCG tablet 1 tablet daily every morning except half tablet on Sundays (Patient taking differently: Take 87.5-175 mcg by mouth See admin instructions. Take 1 tablet daily every morning except half tablet on Sundays) 90 tablet 2   melatonin 5 MG TABS Take 5 mg by mouth at bedtime as needed (for sleep).     mexiletine (MEXITIL) 150 MG capsule Take 1 capsule by mouth twice daily 60 capsule 8   midodrine (PROAMATINE) 5 MG tablet Take 1 tablet (5 mg total) by mouth 2 (two) times daily as needed. Take 5 mg if doppler is 90 or less. 60 tablet 5   pantoprazole (PROTONIX) 40 MG tablet Take 1 tablet by mouth once daily 90 tablet 3   Probiotic Product (PROBIOTIC PO) Take 1 capsule by mouth in the morning.     promethazine (PHENERGAN) 12.5 MG tablet Take 1 tablet (12.5 mg total) by mouth every 6 (six) hours as needed for nausea or vomiting. 30 tablet 0   rosuvastatin (CRESTOR) 20 MG tablet Take 1 tablet by mouth once daily 90 tablet 0   sotalol (BETAPACE) 80 MG tablet Take 1 tablet by mouth once daily 90 tablet 0   traMADol (ULTRAM) 50 MG tablet Take 1-2 tablets (50-100 mg total) by mouth every 6 (six) hours as needed for moderate pain. 40 tablet 5   traZODone (DESYREL) 50 MG tablet TAKE 1 TABLET BY MOUTH AT BEDTIME (Patient taking differently: Take 50 mg  by mouth at bedtime.) 30 tablet 6   warfarin (COUMADIN) 5 MG tablet TAKE 1 TABLET BY MOUTH ONCE DAILY AS DIRECTED BY HF CLINIC 90 tablet 6   fludrocortisone (FLORINEF) 0.1 MG tablet Take 1 tablet (0.1 mg total) by mouth daily. (Patient not taking: Reported on 02/17/2023) 30 tablet 3   No current facility-administered medications for this encounter.   Facility-Administered Medications Ordered in Other Encounters  Medication Dose Route Frequency Provider Last Rate Last Admin   etomidate (AMIDATE) injection  Anesthesia Intra-op Lucinda Dell, CRNA   20 mg at 02/08/14 0915   rocuronium [facility]) injection    Anesthesia Intra-op Lucinda Dell, CRNA   50 mg at 02/08/14 0932   succinylcholine (ANECTINE) injection    Anesthesia Intra-op Lucinda Dell, CRNA   100 mg at 02/08/14 0915   Ace inhibitors, Amiodarone, Diovan [valsartan], Lipitor [atorvastatin calcium], Norvasc [amlodipine], and Jardiance [empagliflozin]  Review of systems complete and found to be negative unless listed in HPI.     Vitals:   02/17/23 1606 02/17/23 1607  BP: (!) 121/96 (!) 110/0  Pulse: 81   SpO2: 99%     Wt Readings from Last 3 Encounters:  02/17/23 73.7 kg (162 lb 6.4 oz)  01/19/23 73.8 kg (162 lb 12.8 oz)  11/24/22 75.5 kg (166 lb 6.4 oz)    Vital Signs:   Doppler Pressure: 110 Automatic BP: 121/96 (106) HR: 81 SPO2: 99% on RA   Weight: 162.4 lb w/ eqt Last weight: 166.4 lb w/eqt   Physical Exam: General:  NAD.  HEENT: normal  Neck: supple. JVP not elevated.  Carotids 2+ bilat; no bruits. No lymphadenopathy or thryomegaly appreciated. Cor: LVAD hum.  Lungs: Clear. Abdomen: soft, nontender, non-distended. No hepatosplenomegaly. No bruits or masses. Good bowel sounds. Driveline site clean. Anchor in place.  Extremities: no cyanosis, clubbing, rash. Warm no edema  Neuro: alert & oriented x 3. No focal deficits. Moves all 4 without problem     ASSESSMENT AND PLAN:   1) Chronic  systolic HF: ICM, EF 20-25% s/p LVAD HM II implant 08/2013 for DT.   - Echo 3/18 EF 10-15% mild AI. Mod RV dysfunction. LVAD cannula OK - has struggled with RV failure however RHC 11/22 looked good (despite evidence of RVF on LE dopplers showing reflux - Doing well NYHA II - Volume status looks good. Continue to keep fluid intake up  -Failed Jardiance due to volume depletion - He has not tolerated ACE-I or amlodipine or b-blocker in the past.  2) LVAD: HMII 08/2013 for DT. - VAD interrogated personally. Parameters stable. No further DL Fault alarms (see below) - Continue aspirin and coumadin.  - LDH 321 - INR 2.3 Goal 2.0-2.5 Discussed dosing with PharmD personally. 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 Company rep contacted and log files sent. Controller changed but did not fix problem  - Per [employer] - "It appears there is a potential slight degrading conductor/wire connection in Phase A within the driveline. Please monitor for reoccurrence of further Driveline Fault Alarms"  No re-occurrence   3) HTN complicated by orthostatic hypotension - Much improved with close BP monitoring and midodrine - stable  4) VT/Frequent PVCs - s/p ICD gen change.  - Off amio due to cerebellar side effects.  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 - VT  06/16/21 with  DC-CV x 1 - Recurrent VT 12/28/21 after stopping sotalol. Sotalol restarted. QT ok.  - PVCs improved on mexilitene. Continue mexilitene 150 bid - Keep K > 4.0 Mg > 2.0 - Continue to f/u with EP - Continue sotalol and mexilitene. QT interval ok  - No recent VT  5) Atrial flutter, paroxysmal - patient paced out of AFL in 6/22. Atrial therapies enabled  - continue sotalol (on for VT). QTs slightly prolonged but stable - In SR today  6) Chronic anticoagulation: INR goal 2.0-3.0.    - INR 2.3 today. Discussed dosing  with PharmD personally. - Continue ASA 81 mg for LVAD +  warfarin.   7) Hypothyroidism:  - Followed by Dr Lucianne Muss. Stable.No change   8) CAD - no s/s angina - continue Crestor  9) CKD IV - Creatinine baseline 1.8-2.4 -Today creatinine 2.38 - failed Jardiance - Has Nephrology referral with Dr. Signe Colt  Total time spent 40 minutes. Over half that time spent discussing above.   Arvilla Meres, MD  6:28 PM

## 2023-03-02 ENCOUNTER — Other Ambulatory Visit (HOSPITAL_COMMUNITY): Payer: Self-pay | Admitting: *Deleted

## 2023-03-02 DIAGNOSIS — Z7901 Long term (current) use of anticoagulants: Secondary | ICD-10-CM

## 2023-03-02 DIAGNOSIS — Z95811 Presence of heart assist device: Secondary | ICD-10-CM

## 2023-03-06 ENCOUNTER — Other Ambulatory Visit (HOSPITAL_COMMUNITY): Payer: Self-pay | Admitting: Internal Medicine

## 2023-03-06 DIAGNOSIS — Z95811 Presence of heart assist device: Secondary | ICD-10-CM

## 2023-03-06 DIAGNOSIS — I5022 Chronic systolic (congestive) heart failure: Secondary | ICD-10-CM

## 2023-03-09 ENCOUNTER — Other Ambulatory Visit: Payer: Self-pay

## 2023-03-09 DIAGNOSIS — E890 Postprocedural hypothyroidism: Secondary | ICD-10-CM

## 2023-03-09 MED ORDER — LEVOTHYROXINE SODIUM 175 MCG PO TABS
87.50 ug | ORAL_TABLET | ORAL | 2 refills | Status: AC
Start: 2023-03-09 — End: ?

## 2023-03-10 ENCOUNTER — Ambulatory Visit (HOSPITAL_COMMUNITY)
Admission: RE | Admit: 2023-03-10 | Discharge: 2023-03-10 | Disposition: A | Discharge status: Home / Self Care | Payer: Medicare Other | Source: Ambulatory Visit | Attending: Internal Medicine | Admitting: Internal Medicine

## 2023-03-10 ENCOUNTER — Ambulatory Visit (HOSPITAL_COMMUNITY): Payer: Self-pay | Admitting: Pharmacist

## 2023-03-10 DIAGNOSIS — Z7901 Long term (current) use of anticoagulants: Secondary | ICD-10-CM | POA: Diagnosis not present

## 2023-03-10 DIAGNOSIS — Z95811 Presence of heart assist device: Secondary | ICD-10-CM | POA: Insufficient documentation

## 2023-03-10 LAB — PROTIME-INR
INR: 2.9 — ABNORMAL HIGH (ref 0.8–1.2)
Prothrombin Time: 30.3 seconds — ABNORMAL HIGH (ref 11.4–15.2)

## 2023-03-13 ENCOUNTER — Other Ambulatory Visit (HOSPITAL_COMMUNITY): Payer: Self-pay | Admitting: Internal Medicine

## 2023-03-13 DIAGNOSIS — I5043 Acute on chronic combined systolic (congestive) and diastolic (congestive) heart failure: Secondary | ICD-10-CM

## 2023-03-13 DIAGNOSIS — E785 Hyperlipidemia, unspecified: Secondary | ICD-10-CM

## 2023-03-16 ENCOUNTER — Other Ambulatory Visit (HOSPITAL_COMMUNITY): Payer: Self-pay | Admitting: *Deleted

## 2023-03-16 DIAGNOSIS — I472 Ventricular tachycardia, unspecified: Secondary | ICD-10-CM

## 2023-03-16 DIAGNOSIS — Z95811 Presence of heart assist device: Secondary | ICD-10-CM

## 2023-03-16 DIAGNOSIS — I5022 Chronic systolic (congestive) heart failure: Secondary | ICD-10-CM

## 2023-03-16 MED ORDER — SOTALOL HCL 80 MG PO TABS
80.0000 mg | ORAL_TABLET | Freq: Every day | ORAL | 3 refills | Status: DC
Start: 2023-03-16 — End: 2024-06-22

## 2023-03-25 ENCOUNTER — Ambulatory Visit (INDEPENDENT_AMBULATORY_CARE_PROVIDER_SITE_OTHER): Payer: Medicare Other

## 2023-03-25 ENCOUNTER — Other Ambulatory Visit (HOSPITAL_COMMUNITY): Payer: Self-pay | Admitting: *Deleted

## 2023-03-25 DIAGNOSIS — Z95811 Presence of heart assist device: Secondary | ICD-10-CM

## 2023-03-25 DIAGNOSIS — I255 Ischemic cardiomyopathy: Secondary | ICD-10-CM

## 2023-03-25 DIAGNOSIS — Z7901 Long term (current) use of anticoagulants: Secondary | ICD-10-CM

## 2023-03-25 LAB — CUP PACEART REMOTE DEVICE CHECK
Battery Remaining Longevity: 36 mo
Battery Voltage: 2.95 V
Brady Statistic AP VP Percent: 94.6 %
Brady Statistic AP VS Percent: 0.25 %
Brady Statistic AS VP Percent: 5.12 %
Brady Statistic AS VS Percent: 0.03 %
Brady Statistic RA Percent Paced: 94.72 %
Brady Statistic RV Percent Paced: 99.6 %
Date Time Interrogation Session: 20240725022705
HighPow Impedance: 41 Ohm
HighPow Impedance: 54 Ohm
Implantable Lead Connection Status: 753985
Implantable Lead Connection Status: 753985
Implantable Lead Implant Date: 20091103
Implantable Lead Implant Date: 20091103
Implantable Lead Location: 753859
Implantable Lead Location: 753860
Implantable Lead Model: 5076
Implantable Lead Model: 6947
Implantable Pulse Generator Implant Date: 20201028
Lead Channel Impedance Value: 342 Ohm
Lead Channel Impedance Value: 399 Ohm
Lead Channel Impedance Value: 399 Ohm
Lead Channel Pacing Threshold Amplitude: 0.5 V
Lead Channel Pacing Threshold Amplitude: 1.5 V
Lead Channel Pacing Threshold Pulse Width: 0.4 ms
Lead Channel Pacing Threshold Pulse Width: 0.4 ms
Lead Channel Sensing Intrinsic Amplitude: 2.5 mV
Lead Channel Sensing Intrinsic Amplitude: 2.5 mV
Lead Channel Sensing Intrinsic Amplitude: 6.875 mV
Lead Channel Sensing Intrinsic Amplitude: 6.875 mV
Lead Channel Setting Pacing Amplitude: 1.5 V
Lead Channel Setting Pacing Amplitude: 2.75 V
Lead Channel Setting Pacing Pulse Width: 0.6 ms
Lead Channel Setting Sensing Sensitivity: 0.3 mV

## 2023-03-31 ENCOUNTER — Ambulatory Visit (HOSPITAL_COMMUNITY): Payer: Self-pay | Admitting: Pharmacist

## 2023-03-31 ENCOUNTER — Ambulatory Visit (HOSPITAL_COMMUNITY)
Admission: RE | Admit: 2023-03-31 | Discharge: 2023-03-31 | Disposition: A | Discharge status: Home / Self Care | Payer: Medicare Other | Source: Ambulatory Visit | Attending: Cardiology | Admitting: Cardiology

## 2023-03-31 DIAGNOSIS — Z7901 Long term (current) use of anticoagulants: Secondary | ICD-10-CM | POA: Insufficient documentation

## 2023-03-31 DIAGNOSIS — Z95811 Presence of heart assist device: Secondary | ICD-10-CM | POA: Insufficient documentation

## 2023-03-31 LAB — PROTIME-INR
INR: 2.3 — ABNORMAL HIGH (ref 0.8–1.2)
Prothrombin Time: 25.5 seconds — ABNORMAL HIGH (ref 11.4–15.2)

## 2023-04-02 NOTE — Progress Notes (Signed)
Remote ICD transmission.   

## 2023-04-14 ENCOUNTER — Other Ambulatory Visit (HOSPITAL_COMMUNITY): Payer: Self-pay

## 2023-04-14 DIAGNOSIS — Z7901 Long term (current) use of anticoagulants: Secondary | ICD-10-CM

## 2023-04-14 DIAGNOSIS — Z95811 Presence of heart assist device: Secondary | ICD-10-CM

## 2023-04-21 ENCOUNTER — Other Ambulatory Visit (HOSPITAL_COMMUNITY): Payer: Medicare Other

## 2023-04-21 ENCOUNTER — Ambulatory Visit (HOSPITAL_COMMUNITY)
Admission: RE | Admit: 2023-04-21 | Discharge: 2023-04-21 | Disposition: A | Discharge status: Home / Self Care | Payer: Medicare Other | Source: Ambulatory Visit | Attending: Internal Medicine | Admitting: Internal Medicine

## 2023-04-21 ENCOUNTER — Ambulatory Visit (HOSPITAL_COMMUNITY): Payer: Self-pay | Admitting: Pharmacist

## 2023-04-21 DIAGNOSIS — Z7901 Long term (current) use of anticoagulants: Secondary | ICD-10-CM | POA: Insufficient documentation

## 2023-04-21 DIAGNOSIS — Z95811 Presence of heart assist device: Secondary | ICD-10-CM | POA: Diagnosis not present

## 2023-04-21 DIAGNOSIS — Z4509 Encounter for adjustment and management of other cardiac device: Secondary | ICD-10-CM | POA: Insufficient documentation

## 2023-04-21 LAB — PROTIME-INR
INR: 2.5 — ABNORMAL HIGH (ref 0.8–1.2)
Prothrombin Time: 26.9 seconds — ABNORMAL HIGH (ref 11.4–15.2)

## 2023-04-28 DIAGNOSIS — N184 Chronic kidney disease, stage 4 (severe): Secondary | ICD-10-CM | POA: Diagnosis not present

## 2023-04-28 DIAGNOSIS — I951 Orthostatic hypotension: Secondary | ICD-10-CM | POA: Diagnosis not present

## 2023-04-28 DIAGNOSIS — Z95811 Presence of heart assist device: Secondary | ICD-10-CM | POA: Diagnosis not present

## 2023-04-28 DIAGNOSIS — I5022 Chronic systolic (congestive) heart failure: Secondary | ICD-10-CM | POA: Diagnosis not present

## 2023-05-06 ENCOUNTER — Other Ambulatory Visit (HOSPITAL_COMMUNITY): Payer: Self-pay

## 2023-05-06 DIAGNOSIS — Z95811 Presence of heart assist device: Secondary | ICD-10-CM

## 2023-05-06 DIAGNOSIS — Z7901 Long term (current) use of anticoagulants: Secondary | ICD-10-CM

## 2023-05-10 ENCOUNTER — Ambulatory Visit (HOSPITAL_COMMUNITY)
Admission: RE | Admit: 2023-05-10 | Discharge: 2023-05-10 | Disposition: A | Discharge status: Home / Self Care | Payer: Medicare Other | Source: Ambulatory Visit | Attending: Internal Medicine | Admitting: Internal Medicine

## 2023-05-10 ENCOUNTER — Encounter (HOSPITAL_COMMUNITY): Payer: Self-pay | Admitting: Internal Medicine

## 2023-05-10 ENCOUNTER — Ambulatory Visit (HOSPITAL_COMMUNITY): Payer: Self-pay | Admitting: Pharmacist

## 2023-05-10 VITALS — BP 117/98 | HR 80 | Wt 165.2 lb

## 2023-05-10 DIAGNOSIS — I472 Ventricular tachycardia, unspecified: Secondary | ICD-10-CM

## 2023-05-10 DIAGNOSIS — Z79899 Other long term (current) drug therapy: Secondary | ICD-10-CM | POA: Insufficient documentation

## 2023-05-10 DIAGNOSIS — Z7982 Long term (current) use of aspirin: Secondary | ICD-10-CM | POA: Diagnosis not present

## 2023-05-10 DIAGNOSIS — Z95810 Presence of automatic (implantable) cardiac defibrillator: Secondary | ICD-10-CM | POA: Diagnosis not present

## 2023-05-10 DIAGNOSIS — E039 Hypothyroidism, unspecified: Secondary | ICD-10-CM | POA: Diagnosis not present

## 2023-05-10 DIAGNOSIS — N184 Chronic kidney disease, stage 4 (severe): Secondary | ICD-10-CM | POA: Diagnosis not present

## 2023-05-10 DIAGNOSIS — Z7901 Long term (current) use of anticoagulants: Secondary | ICD-10-CM | POA: Diagnosis not present

## 2023-05-10 DIAGNOSIS — I130 Hypertensive heart and chronic kidney disease with heart failure and stage 1 through stage 4 chronic kidney disease, or unspecified chronic kidney disease: Secondary | ICD-10-CM | POA: Diagnosis not present

## 2023-05-10 DIAGNOSIS — I5022 Chronic systolic (congestive) heart failure: Secondary | ICD-10-CM | POA: Diagnosis present

## 2023-05-10 DIAGNOSIS — I5082 Biventricular heart failure: Secondary | ICD-10-CM | POA: Diagnosis not present

## 2023-05-10 DIAGNOSIS — I255 Ischemic cardiomyopathy: Secondary | ICD-10-CM | POA: Insufficient documentation

## 2023-05-10 DIAGNOSIS — I4892 Unspecified atrial flutter: Secondary | ICD-10-CM | POA: Diagnosis not present

## 2023-05-10 DIAGNOSIS — I951 Orthostatic hypotension: Secondary | ICD-10-CM | POA: Diagnosis not present

## 2023-05-10 DIAGNOSIS — I2510 Atherosclerotic heart disease of native coronary artery without angina pectoris: Secondary | ICD-10-CM | POA: Insufficient documentation

## 2023-05-10 DIAGNOSIS — I493 Ventricular premature depolarization: Secondary | ICD-10-CM | POA: Diagnosis not present

## 2023-05-10 DIAGNOSIS — Z95811 Presence of heart assist device: Secondary | ICD-10-CM | POA: Diagnosis not present

## 2023-05-10 LAB — COMPREHENSIVE METABOLIC PANEL
ALT: 19 U/L (ref 0–44)
AST: 30 U/L (ref 15–41)
Albumin: 3.9 g/dL (ref 3.5–5.0)
Alkaline Phosphatase: 85 U/L (ref 38–126)
Anion gap: 3 — ABNORMAL LOW (ref 5–15)
BUN: 34 mg/dL — ABNORMAL HIGH (ref 8–23)
CO2: 28 mmol/L (ref 22–32)
Calcium: 8.7 mg/dL — ABNORMAL LOW (ref 8.9–10.3)
Chloride: 106 mmol/L (ref 98–111)
Creatinine, Ser: 2.45 mg/dL — ABNORMAL HIGH (ref 0.61–1.24)
GFR, Estimated: 26 mL/min — ABNORMAL LOW (ref >60)
Glucose, Bld: 80 mg/dL (ref 70–99)
Potassium: 4.6 mmol/L (ref 3.5–5.1)
Sodium: 137 mmol/L (ref 135–145)
Total Bilirubin: 0.5 mg/dL (ref 0.3–1.2)
Total Protein: 6.9 g/dL (ref 6.5–8.1)

## 2023-05-10 LAB — MAGNESIUM: Magnesium: 1.9 mg/dL (ref 1.7–2.4)

## 2023-05-10 LAB — CBC
HCT: 37.2 % — ABNORMAL LOW (ref 39.0–52.0)
Hemoglobin: 12 g/dL — ABNORMAL LOW (ref 13.0–17.0)
MCH: 29.9 pg (ref 26.0–34.0)
MCHC: 32.3 g/dL (ref 30.0–36.0)
MCV: 92.8 fL (ref 80.0–100.0)
Platelets: 187 10*3/uL (ref 150–400)
RBC: 4.01 MIL/uL — ABNORMAL LOW (ref 4.22–5.81)
RDW: 14.8 % (ref 11.5–15.5)
WBC: 6.6 10*3/uL (ref 4.0–10.5)
nRBC: 0 % (ref 0.0–0.2)

## 2023-05-10 LAB — PROTIME-INR
INR: 2.6 — ABNORMAL HIGH (ref 0.8–1.2)
Prothrombin Time: 28.3 s — ABNORMAL HIGH (ref 11.4–15.2)

## 2023-05-10 LAB — LACTATE DEHYDROGENASE: LDH: 323 U/L — ABNORMAL HIGH (ref 98–192)

## 2023-05-10 LAB — PREALBUMIN: Prealbumin: 22 mg/dL (ref 18–38)

## 2023-05-10 NOTE — Patient Instructions (Signed)
No medication changes Coumadin dosing per Lauren PharmD Return to VAD clinic in 2 months for follow up with Dr Gala Romney

## 2023-05-10 NOTE — Progress Notes (Signed)
VAD CLINIC NOTE    HPI:  Johnny Oneill is a 82 y.o. 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   Presented to ER on 06/27/19 with syncopal episode and head injury from fall. ECG showed VT @ 240. Underwent emergent DC-CV in ER. Head CT no bleed. Admitted and had ICD gen replacement (EOL). -> Did not tolerate amio due to tremors.  Had ICD shock for VT on 06/09/20. Started on sotalol  On 02/06/21 Paced out of AFL.  Seen in ER 05/2021 due to LOW FLOW alarms. Found to be in VT rate 170. DCCV x 1 - returned to SR with frequent PVCs. Pacer settings adjusted by Otilio Saber PA- DDD 90.  RHC 11/22 to assess for RV failure RA = 5 RV = 33/5 PA = 33/13 (20) PCW = 10 Fick cardiac output/index = 4.0/2.1 Thermo CO/CI = 4.9/2.6 PVR = 2.0 WU Ao sat = 95% PA sat = 54%, 59% PAPI = 4.0  Admitted 12/01/21 for R TKA. Did well with surgery but complicated by severe orthostatic hypotension. Midodrine added    Admitted 12/28/21 with recurrent VT. Had DC-CV in ER. Sotalol restarted. Continued to have elevated MAPs as well as orthostasis. Midodrine decreased and florinef added.   Admitted 02/09/22 with  a/c RV failure w/ cardiogenic shock. LFTs, INR, creatinine elevated on admit. Started on empiric milrinone with gradual improvement. LFTs/INR/creatinine improved. Had RHC primary left sided failure. LVAD speed optimized with speed increased to 9800.  Milrinone stopped without difficulty. INR followed closely by pharmacy with adjustments to coumadin.   Here for f/u. Doing great. Taking 1 midodrine every am and another as needed in the afternoon. Remains active. Denies orthopnea or PND. No fevers, chills or problems with driveline. No bleeding, melena or neuro symptoms. No VAD alarms on his controller but having DL Fault alarms on System Unit. Taking all meds as prescribed.    VAD Indication: Destination therapy- patient  choice     VAD interrogation & Equipment Management: Speed: 9800 Flow: 3.8 Power: 5.5 w    PI: 6.1   Alarms: Drive line Fault Events: 74-25 daily  Fixed speed: 9800  Low speed limit: 9200   Primary Controller:  Replace back up battery in 16 months Back up controller:  Replace back up battery in 6 months--not present today   Annual Equipment Maintenance on UBC/PM was performed 09/22/22   I reviewed the LVAD parameters from today and compared the results to the patient's prior recorded data. LVAD interrogation was NEGATIVE for significant power changes, NEGATIVE for clinical alarms and POSITIVE for PI events/speed drops. No programming changes were made and pump is functioning within specified parameters. Pt is performing daily controller and system monitor self tests along with completing weekly and monthly maintenance for LVAD equipment.   I reviewed the LVAD parameters from today and compared the results to the patient's prior recorded data. LVAD interrogation was NEGATIVE for significant power changes, NEGATIVE for clinical alarms and POSITIVE for PI events/speed drops. No programming changes were made and pump is functioning within specified parameters. Pt is performing daily controller and system monitor self tests along with completing weekly and monthly maintenance for LVAD equipment.  Past Medical History:  Diagnosis Date   Anxiety    Automatic implantable cardioverter-defibrillator in situ    CAD (coronary artery disease)    S/P stenting of LCX in 2004;  s/p Lat  MI 2009 with occlusion of the LCX - treated with Promus stenting. Has total occlusion of the RCA.    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 10/09 Echo: Sev LV Dysfxn, inf/lat AK, Mod MR.   Liver disease    LVAD  (left ventricular assist device) present (HCC)    Osteoarthritis    a. s/p R TKR   Pacemaker    PVC (premature ventricular contraction)    RBBB    Shortness of breath     Current Outpatient Medications  Medication Sig Dispense Refill   acetaminophen (TYLENOL) 500 MG tablet Take 1,000 mg by mouth every 6 (six) hours as needed (for pain or headaches).     aspirin EC 81 MG tablet Take 81 mg by mouth in the morning.     Cholecalciferol (VITAMIN D3) 125 MCG (5000 UT) CAPS Take 5,000 Units by mouth daily with breakfast.     citalopram (CELEXA) 10 MG tablet Take 1 tablet by mouth once daily 90 tablet 3   Coenzyme Q10 (COQ10) 200 MG CAPS Take 200 mg by mouth in the morning.     doxazosin (CARDURA) 2 MG tablet Take 0.5 tablets (1 mg total) by mouth 2 (two) times daily. Take 0.5 tablet (1 mg total) by mouth 2 (two) times daily if Doppler BP 120 or greater. Check BP in the morning and evening. 30 tablet 5   Famotidine (PEPCID PO) Take 10 mg by mouth every morning.     levothyroxine (SYNTHROID) 175 MCG tablet Take 0.5-1 tablets (87.5-175 mcg total) by mouth See admin instructions. Take 1 tablet daily every morning except half tablet on Sundays 90 tablet 2   melatonin 5 MG TABS Take 5 mg by mouth at bedtime as needed (for sleep).     mexiletine (MEXITIL) 150 MG capsule Take 1 capsule by mouth twice daily 60 capsule 8   midodrine (PROAMATINE) 5 MG tablet TAKE 1 TABLET BY MOUTH TWICE DAILY AS NEEDED. TAKE 5 MG IF DOPPLER IS 90 OR LESS 60 tablet 6   pantoprazole (PROTONIX) 40 MG tablet Take 1 tablet by mouth once daily 90 tablet 3   Probiotic Product (PROBIOTIC PO) Take 1 capsule by mouth in the morning.     rosuvastatin (CRESTOR) 20 MG tablet Take 1 tablet by mouth once daily 90 tablet 0   sotalol (BETAPACE) 80 MG tablet Take 1 tablet (80 mg total) by mouth daily. 90 tablet 3   traZODone (DESYREL) 50 MG tablet TAKE 1 TABLET BY MOUTH AT BEDTIME (Patient taking differently: Take 50 mg by mouth at bedtime.)  30 tablet 6   warfarin (COUMADIN) 5 MG tablet TAKE 1 TABLET BY MOUTH ONCE DAILY AS DIRECTED BY HF CLINIC 90 tablet 6   fludrocortisone (FLORINEF) 0.1 MG tablet Take 1 tablet (0.1 mg total) by mouth daily. (Patient not taking: Reported on 02/17/2023) 30 tablet 3   furosemide (LASIX) 20 MG tablet Take 1 tablet (20 mg total) by mouth daily as needed (Take 20 mg as daily as needed for wt > 153 lbs). Reported on 01/14/2016 (Patient not taking: Reported on 05/10/2023) 30 tablet 6   promethazine (PHENERGAN) 12.5 MG tablet Take 1 tablet (12.5 mg total) by mouth every 6 (six) hours as needed  for nausea or vomiting. (Patient not taking: Reported on 05/10/2023) 30 tablet 0   traMADol (ULTRAM) 50 MG tablet Take 1-2 tablets (50-100 mg total) by mouth every 6 (six) hours as needed for moderate pain. (Patient not taking: Reported on 05/10/2023) 40 tablet 5   No current facility-administered medications for this encounter.   Facility-Administered Medications Ordered in Other Encounters  Medication Dose Route Frequency Provider Last Rate Last Admin   etomidate (AMIDATE) injection    Anesthesia Intra-op Lucinda Dell, CRNA   20 mg at 02/08/14 0915   rocuronium [facility]) injection    Anesthesia Intra-op Lucinda Dell, CRNA   50 mg at 02/08/14 0932   succinylcholine (ANECTINE) injection    Anesthesia Intra-op Lucinda Dell, CRNA   100 mg at 02/08/14 0915   Ace inhibitors, Amiodarone, Diovan [valsartan], Lipitor [atorvastatin calcium], Norvasc [amlodipine], and Jardiance [empagliflozin]  Review of systems complete and found to be negative unless listed in HPI.     Vitals:   05/10/23 1137 05/10/23 1138  BP: (!) 122/0 (!) 117/98  Pulse: 80     Wt Readings from Last 3 Encounters:  05/10/23 74.9 kg (165 lb 3.2 oz)  02/17/23 73.7 kg (162 lb 6.4 oz)  01/19/23 73.8 kg (162 lb 12.8 oz)    Vital Signs:   Doppler Pressure: 122 Automatic BP: 117/98 (105) HR: 80 SPO2: UTO% on RA   Weight: 165.2 lb w/  eqt Last weight: 162.4 lb w/eqt  Physical Exam: General:  NAD.  HEENT: normal  Neck: supple. JVP not elevated.  Carotids 2+ bilat; no bruits. No lymphadenopathy or thryomegaly appreciated. Cor: LVAD hum.  Lungs: Clear. Abdomen: soft, nontender, non-distended. No hepatosplenomegaly. No bruits or masses. Good bowel sounds. Driveline site clean. Anchor in place.  Extremities: no cyanosis, clubbing, rash. Warm no edema  Neuro: alert & oriented x 3. No focal deficits. Moves all 4 without problem    ASSESSMENT AND PLAN:   1) Chronic systolic HF: ICM, EF 20-25% s/p LVAD HM II implant 08/2013 for DT.   - Echo 3/18 EF 10-15% mild AI. Mod RV dysfunction. LVAD cannula OK - has struggled with RV failure however RHC 11/22 looked good (despite evidence of RVF on LE dopplers showing reflux - Doing NYHA I-II - Volume status looks good. Continue to keep fluid intake up  -Failed Jardiance due to volume depletion - He has not tolerated ACE-I or amlodipine or b-blocker in the past.  2) LVAD: HMII 08/2013 for DT. - VAD interrogated personally. Parameters stable. Multiple DL Fault alarms -  VAD log files d/w industry. It appears there is a potential degrading conductor/wire connection in Phase A (same phase as previously noted on [id number] 24).  There was no interruption in pump support during these events.   - Continue aspirin and coumadin.  - LDH 323 - INR 2.6 Goal 2.0-2.5 Discussed dosing with PharmD personally. - DL site ok - MAPs ok. Continue midodrine  3) Driveline fault alerts - multiple driveline fault notices on device interrogation previously. Company rep contacted and log files sent. Controller changed but did not fix problem  - See information above   3) HTN complicated by orthostatic hypotension - Much improved with close BP monitoring and midodrine - stable  4) VT/Frequent PVCs - s/p ICD gen change.  - Off amio due to cerebellar side effects.  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 - VT   06/16/21 with  DC-CV x 1 - Recurrent VT 12/28/21 after stopping sotalol. Sotalol  restarted. QT ok.  - PVCs improved on mexilitene. Continue mexilitene 150 bid - Keep K > 4.0 Mg > 2.0 - Continue to f/u with EP - Continue sotalol and mexilitene. QT interval ok  - No recent VT  5) Atrial flutter, paroxysmal - patient paced out of AFL in 6/22. Atrial therapies enabled  - continue sotalol (on for VT). QTs slightly prolonged but stable - In SR today  6) Chronic anticoagulation: INR goal 2.0-3.0.    - INR 2.3 today. Discussed dosing with PharmD personally. - Continue ASA 81 mg for LVAD + warfarin.   7) Hypothyroidism:  - Followed by Dr Lucianne Muss. Stable.No change   8) CAD - no s/s angina - continue Crestor  9) CKD IV - Creatinine baseline 1.8-2.4 -Today creatinine 2.46 - failed Jardiance - Follows with Dr. Signe Colt  Total time spent 40 minutes. Over half that time spent discussing above.   Arvilla Meres, MD  9:46 PM

## 2023-05-10 NOTE — Progress Notes (Signed)
Patient presents for 2 month follow up with 9.5 year Intermacs in VAD Clinic today alone. Reports no problems with VAD equipment or concerns with drive line.   Pt ambulated into clinic unassisted. Denies lightheadedness, falls, shortness of breath, and signs of bleeding. Reports dizziness when he bends over. This resolves with position change, and is not new for him. Pt endorses good appetite and hydration. He remains active without limitation.   He reports he is taking 5 mg of Midodrine daily in the morning and PRN in the afternoon. His orthostatic symptoms have greatly improved since starting this. No changes today per Dr Gala Romney.   Pt seen by Dr. Signe Colt at [facility] for assistance in managing pt's kidney function. Pt states no new medication were prescribed and he was advised to avoid dark colored drinks such as tea and cola. Pt states he has been compliant with this and has follow up scheduled in 6 months with Dr. Signe Colt.   [employer] noted last visit. Advised by [employer] Clinical rep and Engineer that since patient is asymptomatic to send log files at follow up appointments to [employer] primary lead. Log files sent to [employer] today. Pt states he has not had any alarms on his controller and remains asymptomatic. Pt instructed again to call VAD Coordinator if any alarming occurs.   [employer] log file report: The log [id number] displayed (74) intermittent Driveline Fault Alarms during the ~3-day length of the log file history.  It appears there is a potential degrading conductor/wire connection in Phase A (same phase as previously noted on [id number] JUNE 24).  There was no interruption in pump support during these events.  Please monitor for reoccurrence of further Driveline Fault Alarms.    Vital Signs:   Doppler Pressure: 122 Automatic BP: 117/98 (105) HR: 80 SPO2: UTO% on RA   Weight: 165.2 lb w/ eqt Last weight: 162.4 lb w/eqt  VAD Indication: Destination therapy- patient choice      VAD interrogation & Equipment Management: Speed: 9800 Flow: 3.8 Power: 5.5 w    PI: 6.1   Alarms: Drive line Fault Events: 65-78 daily  Fixed speed: 9800  Low speed limit: 9200   Primary Controller:  Replace back up battery in 16 months Back up controller:  Replace back up battery in 6 months--not present today   Annual Equipment Maintenance on UBC/PM was performed 09/22/22   I reviewed the LVAD parameters from today and compared the results to the patient's prior recorded data. LVAD interrogation was NEGATIVE for significant power changes, NEGATIVE for clinical alarms and POSITIVE for PI events/speed drops. No programming changes were made and pump is functioning within specified parameters. Pt is performing daily controller and system monitor self tests along with completing weekly and monthly maintenance for LVAD equipment.  Exit Site Care: Drive line is being maintained weekly by [name], his wife. Existing VAD dressing C/D/I. Pt given 8 weekly kits for at home use.  Significant Events on VAD Support:  10/2013>SBO 12/2016> elevated LDH, admit for Bival   Device: Dual Medtronic Therapies:  on 231  Pacing: DDD 80 bpm  Last check:  12/24/2022  BP & Labs:  Doppler 122 - Doppler is reflecting modified systolic  Hgb pending - No S/S of bleeding. Specifically denies melena/BRBPR or nosebleeds.   LDH pending- established baseline of 200- 350. Denies tea-colored urine. No power elevations noted on interrogation.   9.5 year Intermacs follow up completed including:  Quality of Life, KCCQ-12, and Neurocognitive trail  making.   Pt completed 1400 feet during 6 minute walk.  Back up controller: not present  Patient Goals: "To keep living, and stay out of the hospital."   [facility] Cardiomyopathy Questionnaire     05/10/2023   12:07 PM 09/22/2022   10:59 AM 03/11/2022    2:26 PM  KCCQ-12  1 a. Ability to shower/bathe Not at all limited Quite a bit limited Slightly limited  1 b.  Ability to walk 1 block Not at all limited Slightly limited Slightly limited  1 c. Ability to hurry/jog Slightly limited Slightly limited Quite a bit limited  2. Edema feet/ankles/legs Never over the past 2 weeks Less than once a week Less than once a week  3. Limited by fatigue Less than once a week Less than once a week Less than once a week  4. Limited by dyspnea Less than once a week Less than once a week 1-2 times a week  5. Sitting up / on 3+ pillows Never over the past 2 weeks Never over the past 2 weeks Never over the past 2 weeks  6. Limited enjoyment of life Not limited at all Slightly limited Slightly limited  7. Rest of life w/ symptoms Completely satisfied Completely satisfied Completely satisfied  8 a. Participation in hobbies Slightly limited Did not limit at all Did not limit at all  8 b. Participation in chores Did not limit at all Slightly limited Slightly limited  8 c. Visiting family/friends Did not limit at all Did not limit at all Did not limit at all     Patient Instructions:  No medication changes Coumadin dosing per Lauren PharmD Return to VAD clinic in 2 months for follow up with Dr Gala Romney  Alyce Pagan RN VAD Coordinator  Office: [phone]  24/7 Pager: [phone]

## 2023-05-22 ENCOUNTER — Ambulatory Visit
Admission: EM | Admit: 2023-05-22 | Discharge: 2023-05-22 | Disposition: A | Discharge status: Home / Self Care | Payer: Medicare Other

## 2023-05-22 DIAGNOSIS — H1131 Conjunctival hemorrhage, right eye: Secondary | ICD-10-CM

## 2023-05-22 NOTE — ED Triage Notes (Signed)
Patient presents with a blood vessel busted in his right eye on Thursday. He states no change in vision.

## 2023-05-22 NOTE — Discharge Instructions (Signed)
This should reabsorb in the next few weeks.  If symptoms persist or worsen, please follow-up with your eye doctor.

## 2023-05-22 NOTE — ED Provider Notes (Signed)
[facility]    CSN: [id number] Arrival date & time: 05/22/23  1208      History   Chief Complaint No chief complaint on file.   HPI Johnny Oneill is a 82 y.o. male.   Patient presents today with concern of ruptured blood vessel in his right eye that started about 3 days ago.  He noticed some redness in his eye at that time.  Denies any trauma or foreign body to the eye.  He does not wear contacts but wears "readers" for glasses.  He denies any pain, irritation, itching, visual changes.  He does take warfarin.     Past Medical History:  Diagnosis Date   Anxiety    Automatic implantable cardioverter-defibrillator in situ    CAD (coronary artery disease)    S/P stenting of LCX in 2004;  s/p Lat MI 2009 with occlusion of the LCX - treated with Promus stenting. Has total occlusion of the RCA.    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 10/09 Echo: Sev LV Dysfxn, inf/lat AK, Mod MR.   Liver disease    LVAD (left ventricular assist device) present (HCC)    Osteoarthritis    a. s/p R TKR   Pacemaker    PVC (premature ventricular contraction)    RBBB    Shortness of breath     Patient Active Problem List   Diagnosis Date Noted   Malnutrition of moderate degree 02/10/2022   Acute on chronic right-sided heart failure (HCC) 02/09/2022   Syncope and collapse 12/18/2021   CKD (chronic kidney disease), stage III (HCC) 06/16/2021   Chronic systolic CHF (congestive heart failure) (HCC) 01/02/2015   Decreased appetite 11/01/2014   Contact dermatitis 09/11/2014   Inguinal hernia 08/21/2014   Hemothorax 02/08/2014   HTN (hypertension) 01/30/2014   Chronic anticoagulation 01/08/2014   Hypothyroid 12/20/2013   Syncope 11/28/2013   Hemothorax on right 11/06/2013    Poor venous access 11/02/2013   LVAD (left ventricular assist device) present (HCC) 09/18/2013   Weakness generalized 09/15/2013   Nonspecific (abnormal) findings on radiological and other examination of gastrointestinal tract 09/14/2013   Anemia, unspecified 09/14/2013   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   PVC (premature ventricular contraction) 12/08/2012   VT (ventricular tachycardia) 12/08/2012   Essential hypertension, benign 11/12/2010   ISCHEMIC CARDIOMYOPATHY 11/12/2010   Chronic systolic heart failure (HCC) 11/12/2010   Ischemic cardiomyopathy    Myocardial infarction of lateral wall (HCC)    Hypercholesteremia    Automatic implantable cardioverter-defibrillator in situ    CAD (coronary artery disease)    Osteoarthritis     Past Surgical History:  Procedure Laterality Date   CARPAL TUNNEL RELEASE Right 05/29/2016   Procedure: CARPAL TUNNEL RELEASE;  Surgeon: Frederico Hamman, MD;  Location: [facility] OR;  Service: Orthopedics;  Laterality: Right;   CORONARY ANGIOPLASTY WITH STENT PLACEMENT  2004   CORONARY ANGIOPLASTY WITH STENT PLACEMENT  07/2008   DEFIBRILLATOR  2009   ESOPHAGOGASTRODUODENOSCOPY N/A 09/15/2013   Procedure: ESOPHAGOGASTRODUODENOSCOPY (EGD);  Surgeon: Meryl Dare, MD;  Location: [facility] ENDOSCOPY;  Service: Endoscopy;  Laterality: N/A;  bedside   HEMATOMA EVACUATION Right 11/06/2013   Procedure: EVACUATION HEMATOMA;  Surgeon: Alleen Borne, MD;  Location: [facility] OR;  Service: Cardiothoracic;  Laterality: Right;   ICD GENERATOR CHANGEOUT N/A 06/28/2019   Procedure: ICD GENERATOR CHANGEOUT;  Surgeon: Regan Lemming, MD;  Location: [facility] INVASIVE CV LAB;  Service: Cardiovascular;  Laterality: N/A;   INSERT / REPLACE / REMOVE PACEMAKER  2009   INSERTION OF IMPLANTABLE LEFT VENTRICULAR ASSIST DEVICE N/A 09/18/2013   Procedure: INSERTION OF IMPLANTABLE LEFT VENTRICULAR ASSIST DEVICE;  Surgeon: Kerin Perna, MD;  Location: [facility] OR;  Service: Open Heart Surgery;   Laterality: N/A;  CIRC ARREST  NITRIC OXIDE   INTRA-AORTIC BALLOON PUMP INSERTION N/A 09/14/2013   Procedure: INTRA-AORTIC BALLOON PUMP INSERTION;  Surgeon: Dolores Patty, MD;  Location: [facility] CATH LAB;  Service: Cardiovascular;  Laterality: N/A;   INTRAOPERATIVE TRANSESOPHAGEAL ECHOCARDIOGRAM N/A 09/18/2013   Procedure: INTRAOPERATIVE TRANSESOPHAGEAL ECHOCARDIOGRAM;  Surgeon: Kerin Perna, MD;  Location: [facility] OR;  Service: Open Heart Surgery;  Laterality: N/A;   KNEE ARTHROSCOPY     bilaterally   KNEE ARTHROSCOPY W/ PARTIAL MEDIAL MENISCECTOMY  12/2002   left   LEFT VENTRICULAR ASSIST DEVICE     RIGHT HEART CATH N/A 07/28/2021   Procedure: RIGHT HEART CATH;  Surgeon: Dolores Patty, MD;  Location: [facility] INVASIVE CV LAB;  Service: Cardiovascular;  Laterality: N/A;   RIGHT HEART CATH N/A 02/12/2022   Procedure: RIGHT HEART CATH;  Surgeon: Dolores Patty, MD;  Location: [facility] INVASIVE CV LAB;  Service: Cardiovascular;  Laterality: N/A;   RIGHT HEART CATHETERIZATION N/A 08/25/2013   Procedure: RIGHT HEART CATH;  Surgeon: Dolores Patty, MD;  Location: [facility] CATH LAB;  Service: Cardiovascular;  Laterality: N/A;   RIGHT HEART CATHETERIZATION N/A 09/13/2013   Procedure: RIGHT HEART CATH;  Surgeon: Dolores Patty, MD;  Location: [facility] CATH LAB;  Service: Cardiovascular;  Laterality: N/A;   RIGHT HEART CATHETERIZATION N/A 02/08/2014   Procedure: RIGHT HEART CATH;  Surgeon: Dolores Patty, MD;  Location: [facility] CATH LAB;  Service: Cardiovascular;  Laterality: N/A;   RIGHT HEART CATHETERIZATION N/A 02/12/2014   Procedure: RIGHT HEART CATH;  Surgeon: Dolores Patty, MD;  Location: [facility] CATH LAB;  Service: Cardiovascular;  Laterality: N/A;   TOTAL KNEE ARTHROPLASTY  11/27/11   left   TOTAL KNEE ARTHROPLASTY  11/27/2011   Procedure: TOTAL KNEE ARTHROPLASTY;  Surgeon: Thera Flake., MD;  Location: [facility] OR;  Service: Orthopedics;  Laterality: Left;  left total knee arthroplasty   TOTAL KNEE  ARTHROPLASTY Right 12/03/2021   Procedure: TOTAL KNEE ARTHROPLASTY;  Surgeon: Frederico Hamman, MD;  Location: [facility] OR;  Service: Orthopedics;  Laterality: Right;   VIDEO ASSISTED THORACOSCOPY Right 11/06/2013   Procedure: VIDEO ASSISTED THORACOSCOPY;  Surgeon: Alleen Borne, MD;  Location: [facility] OR;  Service: Cardiothoracic;  Laterality: Right;       Home Medications    Prior to Admission medications   Medication Sig Start Date End Date Taking? Authorizing Provider  acetaminophen (TYLENOL) 500 MG tablet Take 1,000 mg by mouth every 6 (six) hours as needed (for pain or headaches).    [provider]  aspirin EC 81 MG tablet Take 81 mg by mouth in the morning.    [provider]  Cholecalciferol (VITAMIN D3) 125 MCG (5000 UT) CAPS Take 5,000 Units by mouth daily with breakfast.    [provider]  citalopram (CELEXA) 10 MG tablet Take 1 tablet by mouth once daily 04/06/22   Bensimhon, Bevelyn Buckles, MD  Coenzyme Q10 (COQ10) 200 MG CAPS Take 200 mg by mouth in the  morning.    [provider]  doxazosin (CARDURA) 2 MG tablet Take 0.5 tablets (1 mg total) by mouth 2 (two) times daily. Take 0.5 tablet (1 mg total) by mouth 2 (two) times daily if Doppler BP 120 or greater. Check BP in the morning and evening. 02/23/22   Bensimhon, Bevelyn Buckles, MD  Famotidine (PEPCID PO) Take 10 mg by mouth every morning.    [provider]  fludrocortisone (FLORINEF) 0.1 MG tablet Take 1 tablet (0.1 mg total) by mouth daily. Patient not taking: Reported on 02/17/2023 04/06/22   Bensimhon, Bevelyn Buckles, MD  furosemide (LASIX) 20 MG tablet Take 1 tablet (20 mg total) by mouth daily as needed (Take 20 mg as daily as needed for wt > 153 lbs). Reported on 01/14/2016 Patient not taking: Reported on 05/10/2023 02/21/21   Graciella Freer, PA-C  levothyroxine (SYNTHROID) 175 MCG tablet Take 0.5-1 tablets (87.5-175 mcg total) by mouth See admin instructions. Take 1 tablet daily every morning except half  tablet on Sundays 03/09/23   Reather Littler, MD  melatonin 5 MG TABS Take 5 mg by mouth at bedtime as needed (for sleep).    [provider]  mexiletine (MEXITIL) 150 MG capsule Take 1 capsule by mouth twice daily 10/23/22   Bensimhon, Bevelyn Buckles, MD  midodrine (PROAMATINE) 5 MG tablet TAKE 1 TABLET BY MOUTH TWICE DAILY AS NEEDED. TAKE 5 MG IF DOPPLER IS 90 OR LESS 03/08/23   Bensimhon, Bevelyn Buckles, MD  pantoprazole (PROTONIX) 40 MG tablet Take 1 tablet by mouth once daily 12/01/22   Bensimhon, Bevelyn Buckles, MD  Probiotic Product (PROBIOTIC PO) Take 1 capsule by mouth in the morning.    [provider]  promethazine (PHENERGAN) 12.5 MG tablet Take 1 tablet (12.5 mg total) by mouth every 6 (six) hours as needed for nausea or vomiting. Patient not taking: Reported on 05/10/2023 08/25/22   Bensimhon, Bevelyn Buckles, MD  rosuvastatin (CRESTOR) 20 MG tablet Take 1 tablet by mouth once daily 03/15/23   Bensimhon, Bevelyn Buckles, MD  sotalol (BETAPACE) 80 MG tablet Take 1 tablet (80 mg total) by mouth daily. 03/16/23   Bensimhon, Bevelyn Buckles, MD  traMADol (ULTRAM) 50 MG tablet Take 1-2 tablets (50-100 mg total) by mouth every 6 (six) hours as needed for moderate pain. Patient not taking: Reported on 05/10/2023 12/12/21   Tonye Becket D, NP  traZODone (DESYREL) 50 MG tablet TAKE 1 TABLET BY MOUTH AT BEDTIME Patient taking differently: Take 50 mg by mouth at bedtime. 02/03/22   Bensimhon, Bevelyn Buckles, MD  warfarin (COUMADIN) 5 MG tablet TAKE 1 TABLET BY MOUTH ONCE DAILY AS DIRECTED BY [facility] CLINIC 01/18/23   Bensimhon, Bevelyn Buckles, MD    Family History Family History  Problem Relation Age of Onset   Heart failure Father    Stroke Father    Coronary artery disease Mother    Heart disease Mother    Hyperlipidemia Sister     Social History Social History   Tobacco Use   Smoking status: Never   Smokeless tobacco: Never  Vaping Use   Vaping status: Never Used  Substance Use Topics   Alcohol use: No   Drug use: No    Types:  Marijuana    Comment: "back in our younger days we smoked a little pot"     Allergies   Ace inhibitors, Amiodarone, Diovan [valsartan], Lipitor [atorvastatin calcium], Norvasc [amlodipine], and Jardiance [empagliflozin]   Review of Systems Review of Systems Per HPI  Physical Exam Triage Vital Signs ED Triage Vitals  Encounter Vitals Group     BP 05/22/23 1247 (!) 120/92     Systolic BP Percentile --      Diastolic BP Percentile --      Pulse Rate 05/22/23 1247 83     Resp --      Temp 05/22/23 1247 98.2 F (36.8 C)     Temp Source 05/22/23 1247 Oral     SpO2 05/22/23 1247 96 %     Weight 05/22/23 1250 161 lb (73 kg)     Height 05/22/23 1250 5' 6.5" (1.689 m)     Head Circumference --      Peak Flow --      Pain Score 05/22/23 1250 0     Pain Loc --      Pain Education --      Exclude from Growth Chart --    No data found.  Updated Vital Signs BP (!) 120/92 (BP Location: Left Arm)   Pulse 83   Temp 98.2 F (36.8 C) (Oral)   Ht 5' 6.5" (1.689 m)   Wt 161 lb (73 kg)   SpO2 96%   BMI 25.60 kg/m   Visual Acuity Right Eye Distance:   Left Eye Distance:   Bilateral Distance:    Right Eye Near:   Left Eye Near:    Bilateral Near:     Physical Exam Constitutional:      General: He is not in acute distress.    Appearance: Normal appearance. He is not toxic-appearing or diaphoretic.  HENT:     Head: Normocephalic and atraumatic.  Eyes:     General: Lids are normal. Lids are everted, no foreign bodies appreciated. Vision grossly intact. Gaze aligned appropriately.     Extraocular Movements: Extraocular movements intact.     Conjunctiva/sclera: Conjunctivae normal.      Comments: Patient has redness resembling ruptured blood vessel throughout majority of sclera of the right eye.  There is an area to the left inner eye notable for possible blood clot.  Pulmonary:     Effort: Pulmonary effort is normal.  Neurological:     General: No focal deficit present.      Mental Status: He is alert and oriented to person, place, and time. Mental status is at baseline.  Psychiatric:        Mood and Affect: Mood normal.        Behavior: Behavior normal.        Thought Content: Thought content normal.        Judgment: Judgment normal.      UC Treatments / Results  Labs (all labs ordered are listed, but only abnormal results are displayed) Labs Reviewed - No data to display  EKG   Radiology No results found.  Procedures Procedures (including critical care time)  Medications Ordered in UC Medications - No data to display  Initial Impression / Assessment and Plan / UC Course  I have reviewed the triage vital signs and the nursing notes.  Pertinent labs & imaging results that were available during my care of the patient were reviewed by me and considered in my medical decision making (see chart for details).     Called Dr. Sherrine Maples with on-call ophthalmology to discuss patient's exam findings given patient takes warfarin.  He advised that it will most likely just take longer to reabsorb given he takes a blood thinning medication but he has no further concerns or need  for emergent evaluation at this time.  Discussed this with patient and following up with provided phone number for ophthalmology if symptoms persist or worsen.  Visual acuity appears normal.  Patient verbalized understanding and was agreeable with plan. Final Clinical Impressions(s) / UC Diagnoses   Final diagnoses:  Subconjunctival hemorrhage of right eye     Discharge Instructions      This should reabsorb in the next few weeks.  If symptoms persist or worsen, please follow-up with your eye doctor.     ED Prescriptions   None    PDMP not reviewed this encounter.   Gustavus Bryant, [place] 05/22/23 1328

## 2023-05-24 DIAGNOSIS — H1131 Conjunctival hemorrhage, right eye: Secondary | ICD-10-CM | POA: Diagnosis not present

## 2023-05-25 ENCOUNTER — Other Ambulatory Visit (HOSPITAL_COMMUNITY): Payer: Self-pay

## 2023-05-25 ENCOUNTER — Telehealth (HOSPITAL_COMMUNITY): Payer: Self-pay

## 2023-05-25 DIAGNOSIS — Z95811 Presence of heart assist device: Secondary | ICD-10-CM

## 2023-05-25 DIAGNOSIS — Z7901 Long term (current) use of anticoagulants: Secondary | ICD-10-CM

## 2023-05-25 NOTE — Telephone Encounter (Signed)
Received phone call from pt's wife [name] with concerns due to a new subconjunctival hemorrhage that has been present for approximately 1 week. Pt saw an optometrist who prescribed drops. Pt's wife states it continues to worsen and she is concerned. Pt denies other signs of bleeding and states his vision is cloudy in that eye. Discussed with Lauren RPH and Dr. Gala Romney. Will plan to check INR tomorrow morning for further evaluation.    Simmie Davies RN, BSN VAD Coordinator 24/7 Pager [phone]

## 2023-05-26 ENCOUNTER — Ambulatory Visit (HOSPITAL_COMMUNITY): Payer: Self-pay | Admitting: Pharmacist

## 2023-05-26 ENCOUNTER — Ambulatory Visit (HOSPITAL_COMMUNITY)
Admission: RE | Admit: 2023-05-26 | Discharge: 2023-05-26 | Disposition: A | Discharge status: Home / Self Care | Payer: Medicare Other | Source: Ambulatory Visit | Attending: Internal Medicine | Admitting: Internal Medicine

## 2023-05-26 ENCOUNTER — Other Ambulatory Visit (HOSPITAL_COMMUNITY): Payer: Self-pay

## 2023-05-26 DIAGNOSIS — Z4509 Encounter for adjustment and management of other cardiac device: Secondary | ICD-10-CM | POA: Diagnosis not present

## 2023-05-26 DIAGNOSIS — Z7901 Long term (current) use of anticoagulants: Secondary | ICD-10-CM

## 2023-05-26 DIAGNOSIS — Z95811 Presence of heart assist device: Secondary | ICD-10-CM | POA: Diagnosis not present

## 2023-05-26 LAB — PROTIME-INR
INR: 2.6 — ABNORMAL HIGH (ref 0.8–1.2)
Prothrombin Time: 27.7 seconds — ABNORMAL HIGH (ref 11.4–15.2)

## 2023-05-28 DIAGNOSIS — T1512XD Foreign body in conjunctival sac, left eye, subsequent encounter: Secondary | ICD-10-CM | POA: Diagnosis not present

## 2023-05-28 DIAGNOSIS — H1131 Conjunctival hemorrhage, right eye: Secondary | ICD-10-CM | POA: Diagnosis not present

## 2023-05-29 DIAGNOSIS — H1132 Conjunctival hemorrhage, left eye: Secondary | ICD-10-CM | POA: Diagnosis not present

## 2023-05-31 DIAGNOSIS — T1512XD Foreign body in conjunctival sac, left eye, subsequent encounter: Secondary | ICD-10-CM | POA: Diagnosis not present

## 2023-05-31 DIAGNOSIS — H1131 Conjunctival hemorrhage, right eye: Secondary | ICD-10-CM | POA: Diagnosis not present

## 2023-06-02 ENCOUNTER — Ambulatory Visit (HOSPITAL_COMMUNITY): Payer: Self-pay | Admitting: Pharmacist

## 2023-06-02 ENCOUNTER — Ambulatory Visit (HOSPITAL_COMMUNITY)
Admission: RE | Admit: 2023-06-02 | Discharge: 2023-06-02 | Disposition: A | Discharge status: Home / Self Care | Payer: Medicare Other | Source: Ambulatory Visit | Attending: Internal Medicine | Admitting: Internal Medicine

## 2023-06-02 DIAGNOSIS — Z4509 Encounter for adjustment and management of other cardiac device: Secondary | ICD-10-CM | POA: Insufficient documentation

## 2023-06-02 DIAGNOSIS — Z95811 Presence of heart assist device: Secondary | ICD-10-CM | POA: Diagnosis not present

## 2023-06-02 DIAGNOSIS — Z7901 Long term (current) use of anticoagulants: Secondary | ICD-10-CM | POA: Diagnosis not present

## 2023-06-02 LAB — BASIC METABOLIC PANEL
Anion gap: 7 (ref 5–15)
BUN: 36 mg/dL — ABNORMAL HIGH (ref 8–23)
CO2: 25 mmol/L (ref 22–32)
Calcium: 9 mg/dL (ref 8.9–10.3)
Chloride: 105 mmol/L (ref 98–111)
Creatinine, Ser: 2.19 mg/dL — ABNORMAL HIGH (ref 0.61–1.24)
GFR, Estimated: 30 mL/min — ABNORMAL LOW (ref >60)
Glucose, Bld: 99 mg/dL (ref 70–99)
Potassium: 3.8 mmol/L (ref 3.5–5.1)
Sodium: 137 mmol/L (ref 135–145)

## 2023-06-02 LAB — PROTIME-INR
INR: 1.6 — ABNORMAL HIGH (ref 0.8–1.2)
Prothrombin Time: 19.6 s — ABNORMAL HIGH (ref 11.4–15.2)

## 2023-06-02 NOTE — Progress Notes (Signed)
LVAD INR 

## 2023-06-08 DIAGNOSIS — H1131 Conjunctival hemorrhage, right eye: Secondary | ICD-10-CM | POA: Diagnosis not present

## 2023-06-09 ENCOUNTER — Ambulatory Visit (INDEPENDENT_AMBULATORY_CARE_PROVIDER_SITE_OTHER): Payer: Medicare Other | Admitting: Otolaryngology

## 2023-06-10 ENCOUNTER — Other Ambulatory Visit (HOSPITAL_COMMUNITY): Payer: Self-pay | Admitting: Internal Medicine

## 2023-06-10 DIAGNOSIS — E785 Hyperlipidemia, unspecified: Secondary | ICD-10-CM

## 2023-06-10 DIAGNOSIS — I5043 Acute on chronic combined systolic (congestive) and diastolic (congestive) heart failure: Secondary | ICD-10-CM

## 2023-06-11 ENCOUNTER — Other Ambulatory Visit (HOSPITAL_COMMUNITY): Payer: Self-pay | Admitting: Unknown Physician Specialty

## 2023-06-11 DIAGNOSIS — Z95811 Presence of heart assist device: Secondary | ICD-10-CM

## 2023-06-11 DIAGNOSIS — Z7901 Long term (current) use of anticoagulants: Secondary | ICD-10-CM

## 2023-06-14 ENCOUNTER — Ambulatory Visit (HOSPITAL_COMMUNITY)
Admission: RE | Admit: 2023-06-14 | Discharge: 2023-06-14 | Disposition: A | Discharge status: Home / Self Care | Payer: Medicare Other | Source: Ambulatory Visit | Attending: Cardiology | Admitting: Cardiology

## 2023-06-14 ENCOUNTER — Ambulatory Visit (HOSPITAL_COMMUNITY): Payer: Self-pay | Admitting: Pharmacist

## 2023-06-14 DIAGNOSIS — Z7901 Long term (current) use of anticoagulants: Secondary | ICD-10-CM | POA: Insufficient documentation

## 2023-06-14 DIAGNOSIS — Z95811 Presence of heart assist device: Secondary | ICD-10-CM | POA: Insufficient documentation

## 2023-06-14 LAB — PROTIME-INR
INR: 2.1 — ABNORMAL HIGH (ref 0.8–1.2)
Prothrombin Time: 24.2 s — ABNORMAL HIGH (ref 11.4–15.2)

## 2023-06-22 DIAGNOSIS — H1131 Conjunctival hemorrhage, right eye: Secondary | ICD-10-CM | POA: Diagnosis not present

## 2023-06-24 ENCOUNTER — Ambulatory Visit (INDEPENDENT_AMBULATORY_CARE_PROVIDER_SITE_OTHER): Payer: Medicare Other

## 2023-06-24 DIAGNOSIS — I255 Ischemic cardiomyopathy: Secondary | ICD-10-CM

## 2023-06-24 LAB — CUP PACEART REMOTE DEVICE CHECK
Battery Remaining Longevity: 30 mo
Battery Voltage: 2.95 V
Brady Statistic AP VP Percent: 96.76 %
Brady Statistic AP VS Percent: 0.29 %
Brady Statistic AS VP Percent: 2.93 %
Brady Statistic AS VS Percent: 0.02 %
Brady Statistic RA Percent Paced: 96.97 %
Brady Statistic RV Percent Paced: 99.63 %
Date Time Interrogation Session: 20241024001804
HighPow Impedance: 42 Ohm
HighPow Impedance: 55 Ohm
Implantable Lead Connection Status: 753985
Implantable Lead Connection Status: 753985
Implantable Lead Implant Date: 20091103
Implantable Lead Implant Date: 20091103
Implantable Lead Location: 753859
Implantable Lead Location: 753860
Implantable Lead Model: 5076
Implantable Lead Model: 6947
Implantable Pulse Generator Implant Date: 20201028
Lead Channel Impedance Value: 342 Ohm
Lead Channel Impedance Value: 399 Ohm
Lead Channel Impedance Value: 399 Ohm
Lead Channel Pacing Threshold Amplitude: 0.5 V
Lead Channel Pacing Threshold Amplitude: 1.375 V
Lead Channel Pacing Threshold Pulse Width: 0.4 ms
Lead Channel Pacing Threshold Pulse Width: 0.4 ms
Lead Channel Sensing Intrinsic Amplitude: 2 mV
Lead Channel Sensing Intrinsic Amplitude: 2 mV
Lead Channel Sensing Intrinsic Amplitude: 6.375 mV
Lead Channel Sensing Intrinsic Amplitude: 6.375 mV
Lead Channel Setting Pacing Amplitude: 1.5 V
Lead Channel Setting Pacing Amplitude: 2.75 V
Lead Channel Setting Pacing Pulse Width: 0.6 ms
Lead Channel Setting Sensing Sensitivity: 0.3 mV

## 2023-06-30 ENCOUNTER — Other Ambulatory Visit (HOSPITAL_COMMUNITY): Payer: Self-pay | Admitting: Internal Medicine

## 2023-06-30 DIAGNOSIS — I255 Ischemic cardiomyopathy: Secondary | ICD-10-CM

## 2023-07-02 ENCOUNTER — Other Ambulatory Visit (HOSPITAL_COMMUNITY): Payer: Self-pay | Admitting: *Deleted

## 2023-07-02 DIAGNOSIS — Z95811 Presence of heart assist device: Secondary | ICD-10-CM

## 2023-07-02 DIAGNOSIS — Z7901 Long term (current) use of anticoagulants: Secondary | ICD-10-CM

## 2023-07-07 ENCOUNTER — Ambulatory Visit (HOSPITAL_COMMUNITY)
Admission: RE | Admit: 2023-07-07 | Discharge: 2023-07-07 | Disposition: A | Discharge status: Home / Self Care | Payer: Medicare Other | Source: Ambulatory Visit | Attending: Cardiology | Admitting: Cardiology

## 2023-07-07 ENCOUNTER — Ambulatory Visit (HOSPITAL_COMMUNITY): Payer: Self-pay | Admitting: Pharmacist

## 2023-07-07 DIAGNOSIS — Z4801 Encounter for change or removal of surgical wound dressing: Secondary | ICD-10-CM | POA: Insufficient documentation

## 2023-07-07 DIAGNOSIS — Z7901 Long term (current) use of anticoagulants: Secondary | ICD-10-CM | POA: Diagnosis not present

## 2023-07-07 DIAGNOSIS — Z95811 Presence of heart assist device: Secondary | ICD-10-CM | POA: Diagnosis not present

## 2023-07-07 LAB — PROTIME-INR
INR: 2.1 — ABNORMAL HIGH (ref 0.8–1.2)
Prothrombin Time: 24 s — ABNORMAL HIGH (ref 11.4–15.2)

## 2023-07-13 NOTE — Progress Notes (Signed)
Remote ICD transmission.   

## 2023-07-19 DIAGNOSIS — Z23 Encounter for immunization: Secondary | ICD-10-CM | POA: Diagnosis not present

## 2023-07-21 ENCOUNTER — Other Ambulatory Visit (HOSPITAL_COMMUNITY): Payer: Self-pay | Admitting: *Deleted

## 2023-07-21 DIAGNOSIS — Z95811 Presence of heart assist device: Secondary | ICD-10-CM

## 2023-07-21 DIAGNOSIS — I5022 Chronic systolic (congestive) heart failure: Secondary | ICD-10-CM

## 2023-07-21 DIAGNOSIS — Z7901 Long term (current) use of anticoagulants: Secondary | ICD-10-CM

## 2023-07-26 ENCOUNTER — Ambulatory Visit (HOSPITAL_COMMUNITY)
Admission: RE | Admit: 2023-07-26 | Discharge: 2023-07-26 | Disposition: A | Discharge status: Home / Self Care | Payer: Medicare Other | Source: Ambulatory Visit | Attending: Internal Medicine | Admitting: Internal Medicine

## 2023-07-26 ENCOUNTER — Other Ambulatory Visit (HOSPITAL_COMMUNITY): Payer: Self-pay | Admitting: Internal Medicine

## 2023-07-26 ENCOUNTER — Ambulatory Visit (HOSPITAL_COMMUNITY): Payer: Self-pay | Admitting: Pharmacist

## 2023-07-26 VITALS — BP 120/0 | HR 82 | Ht 69.0 in | Wt 167.2 lb

## 2023-07-26 DIAGNOSIS — I2510 Atherosclerotic heart disease of native coronary artery without angina pectoris: Secondary | ICD-10-CM | POA: Diagnosis not present

## 2023-07-26 DIAGNOSIS — N184 Chronic kidney disease, stage 4 (severe): Secondary | ICD-10-CM

## 2023-07-26 DIAGNOSIS — I493 Ventricular premature depolarization: Secondary | ICD-10-CM

## 2023-07-26 DIAGNOSIS — Z4801 Encounter for change or removal of surgical wound dressing: Secondary | ICD-10-CM | POA: Insufficient documentation

## 2023-07-26 DIAGNOSIS — Z7901 Long term (current) use of anticoagulants: Secondary | ICD-10-CM | POA: Insufficient documentation

## 2023-07-26 DIAGNOSIS — I472 Ventricular tachycardia, unspecified: Secondary | ICD-10-CM

## 2023-07-26 DIAGNOSIS — I5022 Chronic systolic (congestive) heart failure: Secondary | ICD-10-CM | POA: Diagnosis not present

## 2023-07-26 DIAGNOSIS — Z95811 Presence of heart assist device: Secondary | ICD-10-CM | POA: Diagnosis not present

## 2023-07-26 LAB — CBC
HCT: 40.1 % (ref 39.0–52.0)
Hemoglobin: 12.5 g/dL — ABNORMAL LOW (ref 13.0–17.0)
MCH: 28.3 pg (ref 26.0–34.0)
MCHC: 31.2 g/dL (ref 30.0–36.0)
MCV: 90.9 fL (ref 80.0–100.0)
Platelets: 150 10*3/uL (ref 150–400)
RBC: 4.41 MIL/uL (ref 4.22–5.81)
RDW: 15.3 % (ref 11.5–15.5)
WBC: 4.8 10*3/uL (ref 4.0–10.5)
nRBC: 0 % (ref 0.0–0.2)

## 2023-07-26 LAB — BASIC METABOLIC PANEL
Anion gap: 7 (ref 5–15)
BUN: 35 mg/dL — ABNORMAL HIGH (ref 8–23)
CO2: 22 mmol/L (ref 22–32)
Calcium: 9.3 mg/dL (ref 8.9–10.3)
Chloride: 108 mmol/L (ref 98–111)
Creatinine, Ser: 2.42 mg/dL — ABNORMAL HIGH (ref 0.61–1.24)
GFR, Estimated: 27 mL/min — ABNORMAL LOW (ref >60)
Glucose, Bld: 125 mg/dL — ABNORMAL HIGH (ref 70–99)
Potassium: 4.5 mmol/L (ref 3.5–5.1)
Sodium: 137 mmol/L (ref 135–145)

## 2023-07-26 LAB — LACTATE DEHYDROGENASE: LDH: 251 U/L — ABNORMAL HIGH (ref 98–192)

## 2023-07-26 LAB — PROTIME-INR
INR: 2.5 — ABNORMAL HIGH (ref 0.8–1.2)
Prothrombin Time: 27.6 s — ABNORMAL HIGH (ref 11.4–15.2)

## 2023-07-26 LAB — MAGNESIUM: Magnesium: 1.9 mg/dL (ref 1.7–2.4)

## 2023-07-26 MED ORDER — DOXYCYCLINE HYCLATE 100 MG PO CAPS: 100.0000 mg | ORAL_CAPSULE | Freq: Two times a day (BID) | ORAL | 0 refills | Status: AC

## 2023-07-26 NOTE — Progress Notes (Signed)
Patient presents for 2 month follow up with 9.5 year Intermacs in VAD Clinic today alone. Reports no problems with VAD equipment or concerns with drive line.   Pt ambulated into clinic unassisted. Denies lightheadedness, falls, shortness of breath, and signs of bleeding. Reports dizziness when he bends over. This resolves with position change, and is not new for him. Pt endorses good appetite and hydration. He remains active without limitation.   He reports he is taking 5 mg of Midodrine daily in the morning and PRN in the afternoon. His orthostatic symptoms have greatly improved since starting this. No changes today per Dr Gala Romney.   Pt seen by Dr. Signe Colt at [facility] for assistance in managing pt's kidney function. Pt states no new medication were prescribed and he was advised to avoid dark colored drinks such as tea and cola.   Pt tells me that his driveline is red. Pt would not allow me to change today. He states that he "does not want to get undressed." Pt brought in a picture. Denies tenderness, no drainage. Pic shows small area of induration. Doxycycline 100 mg bid for 7 days per Dr Gala Romney sent to the pts pharmacy.  [employer] noted last visit. Advised by [employer] Clinical rep and Engineer that since patient is asymptomatic to send log files at follow up appointments to [employer] primary lead. Log files sent to [employer] today. Pt states he has not had any alarms on his controller and remains asymptomatic. Pt instructed again to call VAD Coordinator if any alarming occurs.   [employer] log file report:    There were no other unusual events recorded in the log file event history. The MCS equipment is operating as intended.   Vital Signs:   Doppler Pressure: 120 Automatic BP: 115/98 (105) HR: 82 SPO2: UTO% on RA   Weight: 167.2 lb w/ eqt Last weight: 165.2 lb w/eqt  VAD Indication: Destination therapy- patient choice     VAD interrogation & Equipment Management: Speed:  9800 Flow: 4.8 Power: 6.1 w    PI: 6.9   Alarms: Drive line Fault Events: >644 on 11/23-pt tells me that he was [place] the gun [place] on his feet all day and did not drink very much otherwise 20-30 PI daily  Fixed speed: 9800  Low speed limit: 9200   Primary Controller:  Replace back up battery in 14 months Back up controller:  Expired. Replaced with [id number]. Replace back up battery in 30 months   Annual Equipment Maintenance on UBC/PM was performed 09/22/22   I reviewed the LVAD parameters from today and compared the results to the patient's prior recorded data. LVAD interrogation was NEGATIVE for significant power changes, NEGATIVE for clinical alarms and POSITIVE for PI events/speed drops. No programming changes were made and pump is functioning within specified parameters. Pt is performing daily controller and system monitor self tests along with completing weekly and monthly maintenance for LVAD equipment.  Exit Site Care: Drive line is being maintained weekly by [name], his wife. Existing VAD dressing C/D/I. Pt given 8 weekly kits for at home use.  Significant Events on VAD Support:  10/2013>SBO 12/2016> elevated LDH, admit for Bival   Device: Dual Medtronic Therapies:  on 231  Pacing: DDD 80 bpm  Last check:  12/24/2022  BP & Labs:  Doppler 120 - Doppler is reflecting modified systolic  Hgb 12.5 - No S/S of bleeding. Specifically denies melena/BRBPR or nosebleeds.   LDH 251- established baseline of 200- 350. Denies  tea-colored urine. No power elevations noted on interrogation.    Patient Instructions:  No medication changes Coumadin dosing per Lauren PharmD Return to VAD clinic in 2 months for follow up with Dr Gala Romney  Carlton Adam RN VAD Coordinator  Office: [phone]  24/7 Pager: [phone]

## 2023-07-26 NOTE — Patient Instructions (Signed)
Start Doxcycline 100 mg twice a day for 7 days Coumadin dosing per Leotis Shames PharmD Return to VAD clinic in 2 months for follow up with Dr Gala Romney

## 2023-07-26 NOTE — Progress Notes (Signed)
VAD CLINIC NOTE    HPI:  Johnny Oneill is a 82 y.o. 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   Presented to ER on 06/27/19 with syncopal episode and head injury from fall. ECG showed VT @ 240. Underwent emergent DC-CV in ER. Head CT no bleed. Admitted and had ICD gen replacement (EOL). -> Did not tolerate amio due to tremors.  Had ICD shock for VT on 06/09/20. Started on sotalol  On 02/06/21 Paced out of AFL.  Seen in ER 05/2021 due to LOW FLOW alarms. Found to be in VT rate 170. DCCV x 1 - returned to SR with frequent PVCs. Pacer settings adjusted by Otilio Saber PA- DDD 90.  RHC 11/22 to assess for RV failure RA = 5 RV = 33/5 PA = 33/13 (20) PCW = 10 Fick cardiac output/index = 4.0/2.1 Thermo CO/CI = 4.9/2.6 PVR = 2.0 WU Ao sat = 95% PA sat = 54%, 59% PAPI = 4.0  Admitted 12/01/21 for R TKA. Did well with surgery but complicated by severe orthostatic hypotension. Midodrine added    Admitted 12/28/21 with recurrent VT. Had DC-CV in ER. Sotalol restarted. Continued to have elevated MAPs as well as orthostasis. Midodrine decreased and florinef added.   Admitted 02/09/22 with  a/c RV failure w/ cardiogenic shock. LFTs, INR, creatinine elevated on admit. Started on empiric milrinone with gradual improvement. LFTs/INR/creatinine improved. Had RHC primary left sided failure. LVAD speed optimized with speed increased to 9800.  Milrinone stopped without difficulty. INR followed closely by pharmacy with adjustments to coumadin.   Here for f/u. Doing great. Taking 1 midodrine every am and another as needed in the afternoon. Remains active. Denies orthopnea or PND. No fevers, chills or problems with driveline. No bleeding, melena or neuro symptoms. No VAD alarms on his controller but having DL Fault alarms on System Unit. Taking all meds as prescribed.    VAD Indication: Destination therapy- patient  choice     VAD interrogation & Equipment Management: Speed: 9800 Flow: 3.8 Power: 5.5 w    PI: 6.1   Alarms: Drive line Fault Events: 16-10 daily  Fixed speed: 9800  Low speed limit: 9200   Primary Controller:  Replace back up battery in 16 months Back up controller:  Replace back up battery in 6 months--not present today   Annual Equipment Maintenance on UBC/PM was performed 09/22/22   I reviewed the LVAD parameters from today and compared the results to the patient's prior recorded data. LVAD interrogation was NEGATIVE for significant power changes, NEGATIVE for clinical alarms and POSITIVE for PI events/speed drops. No programming changes were made and pump is functioning within specified parameters. Pt is performing daily controller and system monitor self tests along with completing weekly and monthly maintenance for LVAD equipment.   I reviewed the LVAD parameters from today and compared the results to the patient's prior recorded data. LVAD interrogation was NEGATIVE for significant power changes, NEGATIVE for clinical alarms and POSITIVE for PI events/speed drops. No programming changes were made and pump is functioning within specified parameters. Pt is performing daily controller and system monitor self tests along with completing weekly and monthly maintenance for LVAD equipment.  Past Medical History:  Diagnosis Date   Anxiety    Automatic implantable cardioverter-defibrillator in situ    CAD (coronary artery disease)    S/P stenting of LCX in 2004;  s/p Lat  MI 2009 with occlusion of the LCX - treated with Promus stenting. Has total occlusion of the RCA.    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 10/09 Echo: Sev LV Dysfxn, inf/lat AK, Mod MR.   Liver disease    LVAD  (left ventricular assist device) present (HCC)    Osteoarthritis    a. s/p R TKR   Pacemaker    PVC (premature ventricular contraction)    RBBB    Shortness of breath     Current Outpatient Medications  Medication Sig Dispense Refill   acetaminophen (TYLENOL) 500 MG tablet Take 1,000 mg by mouth every 6 (six) hours as needed (for pain or headaches).     aspirin EC 81 MG tablet Take 81 mg by mouth in the morning.     Cholecalciferol (VITAMIN D3) 125 MCG (5000 UT) CAPS Take 5,000 Units by mouth daily with breakfast.     citalopram (CELEXA) 10 MG tablet Take 1 tablet by mouth once daily 90 tablet 3   Coenzyme Q10 (COQ10) 200 MG CAPS Take 200 mg by mouth in the morning.     doxazosin (CARDURA) 2 MG tablet Take 0.5 tablets (1 mg total) by mouth 2 (two) times daily. Take 0.5 tablet (1 mg total) by mouth 2 (two) times daily if Doppler BP 120 or greater. Check BP in the morning and evening. 30 tablet 5   Famotidine (PEPCID PO) Take 10 mg by mouth every morning.     fludrocortisone (FLORINEF) 0.1 MG tablet Take 1 tablet (0.1 mg total) by mouth daily. (Patient not taking: Reported on 02/17/2023) 30 tablet 3   furosemide (LASIX) 20 MG tablet Take 1 tablet (20 mg total) by mouth daily as needed (Take 20 mg as daily as needed for wt > 153 lbs). Reported on 01/14/2016 (Patient not taking: Reported on 05/10/2023) 30 tablet 6   levothyroxine (SYNTHROID) 175 MCG tablet Take 0.5-1 tablets (87.5-175 mcg total) by mouth See admin instructions. Take 1 tablet daily every morning except half tablet on Sundays 90 tablet 2   melatonin 5 MG TABS Take 5 mg by mouth at bedtime as needed (for sleep).     mexiletine (MEXITIL) 150 MG capsule Take 1 capsule by mouth twice daily 60 capsule 8   midodrine (PROAMATINE) 5 MG tablet TAKE 1 TABLET BY MOUTH TWICE DAILY AS NEEDED. TAKE 5 MG IF DOPPLER IS 90 OR LESS 60 tablet 6   pantoprazole (PROTONIX) 40 MG tablet Take 1 tablet by mouth once daily 90 tablet 3   Probiotic Product  (PROBIOTIC PO) Take 1 capsule by mouth in the morning.     promethazine (PHENERGAN) 12.5 MG tablet Take 1 tablet (12.5 mg total) by mouth every 6 (six) hours as needed for nausea or vomiting. (Patient not taking: Reported on 05/10/2023) 30 tablet 0   rosuvastatin (CRESTOR) 20 MG tablet Take 1 tablet by mouth once daily 90 tablet 3   sotalol (BETAPACE) 80 MG tablet Take 1 tablet (80 mg total) by mouth daily. 90 tablet 3   traMADol (ULTRAM) 50 MG tablet Take 1-2 tablets (50-100 mg total) by mouth every 6 (six) hours as needed for moderate pain. (Patient not taking: Reported on 05/10/2023) 40 tablet 5   traZODone (  DESYREL) 50 MG tablet TAKE 1 TABLET BY MOUTH AT BEDTIME (Patient taking differently: Take 50 mg by mouth at bedtime.) 30 tablet 6   warfarin (COUMADIN) 5 MG tablet TAKE 1 TABLET BY MOUTH ONCE DAILY AS DIRECTED BY HF CLINIC 90 tablet 6   No current facility-administered medications for this encounter.   Facility-Administered Medications Ordered in Other Encounters  Medication Dose Route Frequency Provider Last Rate Last Admin   etomidate (AMIDATE) injection    Anesthesia Intra-op Lucinda Dell, CRNA   20 mg at 02/08/14 0915   rocuronium [facility]) injection    Anesthesia Intra-op Lucinda Dell, CRNA   50 mg at 02/08/14 0932   succinylcholine (ANECTINE) injection    Anesthesia Intra-op Lucinda Dell, CRNA   100 mg at 02/08/14 0915   Ace inhibitors, Amiodarone, Diovan [valsartan], Lipitor [atorvastatin calcium], Norvasc [amlodipine], and Jardiance [empagliflozin]  Review of systems complete and found to be negative unless listed in HPI.     There were no vitals filed for this visit.   Wt Readings from Last 3 Encounters:  05/22/23 73 kg (161 lb)  05/10/23 74.9 kg (165 lb 3.2 oz)  02/17/23 73.7 kg (162 lb 6.4 oz)    Vital Signs:   Doppler Pressure: 122 Automatic BP: 117/98 (105) HR: 80 SPO2: UTO% on RA   Weight: 165.2 lb w/ eqt Last weight: 162.4 lb w/eqt  Physical  Exam: General:  NAD.  HEENT: normal  Neck: supple. JVP not elevated.  Carotids 2+ bilat; no bruits. No lymphadenopathy or thryomegaly appreciated. Cor: LVAD hum.  Lungs: Clear. Abdomen: soft, nontender, non-distended. No hepatosplenomegaly. No bruits or masses. Good bowel sounds. Driveline site clean. Anchor in place.  Extremities: no cyanosis, clubbing, rash. Warm no edema  Neuro: alert & oriented x 3. No focal deficits. Moves all 4 without problem    ASSESSMENT AND PLAN:   1) Chronic systolic HF: ICM, EF 20-25% s/p LVAD HM II implant 08/2013 for DT.   - Echo 3/18 EF 10-15% mild AI. Mod RV dysfunction. LVAD cannula OK - has struggled with RV failure however RHC 11/22 looked good (despite evidence of RVF on LE dopplers showing reflux - Doing NYHA I-II - Volume status looks good. Continue to keep fluid intake up  -Failed Jardiance due to volume depletion - He has not tolerated ACE-I or amlodipine or b-blocker in the past.  2) LVAD: HMII 08/2013 for DT. - VAD interrogated personally. Parameters stable. Multiple DL Fault alarms -  VAD log files d/w industry. It appears there is a potential degrading conductor/wire connection in Phase A (same phase as previously noted on [id number] 24).  There was no interruption in pump support during these events.   - Continue aspirin and coumadin.  - LDH 323 - INR 2.6 Goal 2.0-2.5 Discussed dosing with PharmD personally. - DL site ok - MAPs ok. Continue midodrine  3) Driveline fault alerts - multiple driveline fault notices on device interrogation previously. Company rep contacted and log files sent. Controller changed but did not fix problem  - See information above   3) HTN complicated by orthostatic hypotension - Much improved with close BP monitoring and midodrine - stable  4) VT/Frequent PVCs - s/p ICD gen change.  - Off amio due to cerebellar side effects.  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 - VT  06/16/21 with  DC-CV x 1 - Recurrent VT  12/28/21 after stopping sotalol. Sotalol restarted. QT ok.  - PVCs improved on mexilitene. Continue mexilitene 150  bid - Keep K > 4.0 Mg > 2.0 - Continue to f/u with EP - Continue sotalol and mexilitene. QT interval ok  - No recent VT  5) Atrial flutter, paroxysmal - patient paced out of AFL in 6/22. Atrial therapies enabled  - continue sotalol (on for VT). QTs slightly prolonged but stable - In SR today  6) Chronic anticoagulation: INR goal 2.0-3.0.    - INR 2.3 today. Discussed dosing with PharmD personally. - Continue ASA 81 mg for LVAD + warfarin.   7) Hypothyroidism:  - Followed by Dr Lucianne Muss. Stable.No change   8) CAD - no s/s angina - continue Crestor  9) CKD IV - Creatinine baseline 1.8-2.4 -Today creatinine 2.46 - failed Jardiance - Follows with Dr. Signe Colt  Total time spent 40 minutes. Over half that time spent discussing above.   Arvilla Meres, MD  10:08 AM

## 2023-08-02 ENCOUNTER — Emergency Department (HOSPITAL_COMMUNITY)
Admission: EM | Admit: 2023-08-02 | Discharge: 2023-08-02 | Disposition: A | Discharge status: Home / Self Care | Payer: Medicare Other | Attending: Emergency Medicine | Admitting: Emergency Medicine

## 2023-08-02 ENCOUNTER — Emergency Department (HOSPITAL_COMMUNITY): Payer: Medicare Other

## 2023-08-02 ENCOUNTER — Other Ambulatory Visit: Payer: Self-pay

## 2023-08-02 ENCOUNTER — Encounter (HOSPITAL_COMMUNITY): Payer: Self-pay

## 2023-08-02 DIAGNOSIS — Z950 Presence of cardiac pacemaker: Secondary | ICD-10-CM | POA: Diagnosis not present

## 2023-08-02 DIAGNOSIS — I700 Atherosclerosis of aorta: Secondary | ICD-10-CM | POA: Diagnosis not present

## 2023-08-02 DIAGNOSIS — M5459 Other low back pain: Secondary | ICD-10-CM | POA: Diagnosis not present

## 2023-08-02 DIAGNOSIS — K8020 Calculus of gallbladder without cholecystitis without obstruction: Secondary | ICD-10-CM | POA: Diagnosis not present

## 2023-08-02 DIAGNOSIS — K5730 Diverticulosis of large intestine without perforation or abscess without bleeding: Secondary | ICD-10-CM | POA: Diagnosis not present

## 2023-08-02 DIAGNOSIS — N189 Chronic kidney disease, unspecified: Secondary | ICD-10-CM | POA: Diagnosis not present

## 2023-08-02 DIAGNOSIS — M48061 Spinal stenosis, lumbar region without neurogenic claudication: Secondary | ICD-10-CM | POA: Diagnosis not present

## 2023-08-02 DIAGNOSIS — M5126 Other intervertebral disc displacement, lumbar region: Secondary | ICD-10-CM | POA: Diagnosis not present

## 2023-08-02 DIAGNOSIS — M545 Low back pain, unspecified: Secondary | ICD-10-CM

## 2023-08-02 DIAGNOSIS — M5127 Other intervertebral disc displacement, lumbosacral region: Secondary | ICD-10-CM | POA: Insufficient documentation

## 2023-08-02 DIAGNOSIS — K4020 Bilateral inguinal hernia, without obstruction or gangrene, not specified as recurrent: Secondary | ICD-10-CM | POA: Diagnosis not present

## 2023-08-02 LAB — COMPREHENSIVE METABOLIC PANEL
ALT: 13 U/L (ref 0–44)
AST: 19 U/L (ref 15–41)
Albumin: 4.2 g/dL (ref 3.5–5.0)
Alkaline Phosphatase: 84 U/L (ref 38–126)
Anion gap: 9 (ref 5–15)
BUN: 43 mg/dL — ABNORMAL HIGH (ref 8–23)
CO2: 22 mmol/L (ref 22–32)
Calcium: 9.5 mg/dL (ref 8.9–10.3)
Chloride: 105 mmol/L (ref 98–111)
Creatinine, Ser: 2.31 mg/dL — ABNORMAL HIGH (ref 0.61–1.24)
GFR, Estimated: 28 mL/min — ABNORMAL LOW (ref >60)
Glucose, Bld: 123 mg/dL — ABNORMAL HIGH (ref 70–99)
Potassium: 5.1 mmol/L (ref 3.5–5.1)
Sodium: 136 mmol/L (ref 135–145)
Total Bilirubin: 0.9 mg/dL (ref <1.2)
Total Protein: 7.6 g/dL (ref 6.5–8.1)

## 2023-08-02 LAB — URINALYSIS, ROUTINE W REFLEX MICROSCOPIC
Bacteria, UA: NONE SEEN
Bilirubin Urine: NEGATIVE
Glucose, UA: NEGATIVE mg/dL
Ketones, ur: NEGATIVE mg/dL
Leukocytes,Ua: NEGATIVE
Nitrite: NEGATIVE
Protein, ur: NEGATIVE mg/dL
Specific Gravity, Urine: 1.016 (ref 1.005–1.030)
pH: 5 (ref 5.0–8.0)

## 2023-08-02 LAB — CBC
HCT: 41.9 % (ref 39.0–52.0)
Hemoglobin: 13.4 g/dL (ref 13.0–17.0)
MCH: 29.5 pg (ref 26.0–34.0)
MCHC: 32 g/dL (ref 30.0–36.0)
MCV: 92.1 fL (ref 80.0–100.0)
Platelets: 190 10*3/uL (ref 150–400)
RBC: 4.55 MIL/uL (ref 4.22–5.81)
RDW: 15.5 % (ref 11.5–15.5)
WBC: 7.9 10*3/uL (ref 4.0–10.5)
nRBC: 0 % (ref 0.0–0.2)

## 2023-08-02 LAB — PROTIME-INR
INR: 2.5 — ABNORMAL HIGH (ref 0.8–1.2)
Prothrombin Time: 27 s — ABNORMAL HIGH (ref 11.4–15.2)

## 2023-08-02 LAB — LIPASE, BLOOD: Lipase: 36 U/L (ref 11–51)

## 2023-08-02 MED ORDER — WARFARIN - PHARMACIST DOSING INPATIENT: Freq: Every day | Status: DC

## 2023-08-02 MED ORDER — HYDROCODONE-ACETAMINOPHEN 5-325 MG PO TABS: 1.0000 | ORAL_TABLET | ORAL | 0 refills | Status: DC | PRN

## 2023-08-02 MED ORDER — ONDANSETRON HCL 4 MG/2ML IJ SOLN
4.0000 mg | Freq: Once | INTRAMUSCULAR | Status: AC
  Administered 2023-08-02: 4 mg via INTRAVENOUS
  Filled 2023-08-02: qty 2

## 2023-08-02 MED ORDER — HYDROCODONE-ACETAMINOPHEN 5-325 MG PO TABS
1.0000 | ORAL_TABLET | Freq: Once | ORAL | Status: AC
  Administered 2023-08-02: 1 via ORAL
  Filled 2023-08-02: qty 1

## 2023-08-02 MED ORDER — WARFARIN SODIUM 2.5 MG PO TABS
2.5000 mg | ORAL_TABLET | Freq: Once | ORAL | Status: DC
  Filled 2023-08-02: qty 1

## 2023-08-02 MED ORDER — MORPHINE SULFATE (PF) 4 MG/ML IV SOLN
4.0000 mg | Freq: Once | INTRAVENOUS | Status: AC
  Administered 2023-08-02: 4 mg via INTRAVENOUS
  Filled 2023-08-02: qty 1

## 2023-08-02 MED ORDER — ONDANSETRON 4 MG PO TBDP
4.0000 mg | ORAL_TABLET | Freq: Once | ORAL | Status: AC
Start: 2023-08-02 — End: 2023-08-02
  Administered 2023-08-02: 4 mg via ORAL
  Filled 2023-08-02: qty 1

## 2023-08-02 NOTE — Discharge Instructions (Addendum)
1.  You may take 1 Vicodin tablet every 4-6 hours for pain control. 2.  At this time your back pain appears to be due to a herniated disc.  Follow-up with your doctor for recheck and monitoring of this.  You may need referral to the spine specialist for pain control and treatment.

## 2023-08-02 NOTE — ED Provider Notes (Signed)
[place] EMERGENCY DEPARTMENT AT [facility] Provider Note   CSN: [id number] Arrival date & time: 08/02/23  1257     History {Add pertinent medical, surgical, social history, OB history to HPI:1} Chief Complaint  Patient presents with   Back Pain    Johnny Oneill is a 82 y.o. male.  82 year old male with prior medical history as detailed below presents for evaluation.  Patient with complaint of significant low back pain.  Pain began this morning.  He reportedly woke up this morning was feeling fine.  As he was ambulating around his house he felt a twinge of initial pain and then the pain increased.  He took some Tylenol at home without improvement or control of his pain.  Patient does have LVAD.  He also has CKD.  He contacted LVAD team and came to the ED for evaluation.  On initial evaluation he complains of significant pain to the low back.  He denies associated numbness, weakness, urinary change or incontinence, change in bowel movements.  Revonda Standard, with LVAD team, also present in room.    The history is provided by the patient and medical records.       Home Medications Prior to Admission medications   Medication Sig Start Date End Date Taking? Authorizing Provider  acetaminophen (TYLENOL) 500 MG tablet Take 1,000 mg by mouth every 6 (six) hours as needed (for pain or headaches).    [provider]  aspirin EC 81 MG tablet Take 81 mg by mouth in the morning.    [provider]  Cholecalciferol (VITAMIN D3) 125 MCG (5000 UT) CAPS Take 5,000 Units by mouth daily with breakfast.    [provider]  citalopram (CELEXA) 10 MG tablet Take 1 tablet by mouth once daily 07/01/23   Bensimhon, Bevelyn Buckles, MD  Coenzyme Q10 (COQ10) 200 MG CAPS Take 200 mg by mouth in the morning.    [provider]  doxazosin (CARDURA) 2 MG tablet Take 0.5 tablets (1 mg total) by mouth 2 (two) times daily. Take 0.5 tablet (1 mg total) by mouth 2 (two) times  daily if Doppler BP 120 or greater. Check BP in the morning and evening. Patient not taking: Reported on 07/26/2023 02/23/22   Bensimhon, Bevelyn Buckles, MD  doxycycline (VIBRAMYCIN) 100 MG capsule Take 1 capsule (100 mg total) by mouth 2 (two) times daily for 7 days. 07/26/23 08/02/23  Bensimhon, Bevelyn Buckles, MD  Famotidine (PEPCID PO) Take 10 mg by mouth every morning.    [provider]  fludrocortisone (FLORINEF) 0.1 MG tablet Take 1 tablet (0.1 mg total) by mouth daily. Patient not taking: Reported on 02/17/2023 04/06/22   Bensimhon, Bevelyn Buckles, MD  furosemide (LASIX) 20 MG tablet Take 1 tablet (20 mg total) by mouth daily as needed (Take 20 mg as daily as needed for wt > 153 lbs). Reported on 01/14/2016 Patient not taking: Reported on 05/10/2023 02/21/21   Graciella Freer, PA-C  levothyroxine (SYNTHROID) 175 MCG tablet Take 0.5-1 tablets (87.5-175 mcg total) by mouth See admin instructions. Take 1 tablet daily every morning except half tablet on Sundays 03/09/23   Reather Littler, MD  melatonin 5 MG TABS Take 5 mg by mouth at bedtime as needed (for sleep).    [provider]  mexiletine (MEXITIL) 150 MG capsule Take 1 capsule by mouth twice daily 07/27/23   Bensimhon, Bevelyn Buckles, MD  midodrine (PROAMATINE) 5 MG tablet TAKE 1 TABLET BY MOUTH TWICE DAILY AS NEEDED. TAKE  5 MG IF DOPPLER IS 90 OR LESS 03/08/23   Bensimhon, Bevelyn Buckles, MD  pantoprazole (PROTONIX) 40 MG tablet Take 1 tablet by mouth once daily 12/01/22   Bensimhon, Bevelyn Buckles, MD  Probiotic Product (PROBIOTIC PO) Take 1 capsule by mouth in the morning.    [provider]  promethazine (PHENERGAN) 12.5 MG tablet Take 1 tablet (12.5 mg total) by mouth every 6 (six) hours as needed for nausea or vomiting. Patient not taking: Reported on 05/10/2023 08/25/22   Bensimhon, Bevelyn Buckles, MD  rosuvastatin (CRESTOR) 20 MG tablet Take 1 tablet by mouth once daily 06/11/23   Bensimhon, Bevelyn Buckles, MD  sotalol (BETAPACE) 80 MG tablet Take 1 tablet (80 mg  total) by mouth daily. 03/16/23   Bensimhon, Bevelyn Buckles, MD  traMADol (ULTRAM) 50 MG tablet Take 1-2 tablets (50-100 mg total) by mouth every 6 (six) hours as needed for moderate pain. Patient not taking: Reported on 05/10/2023 12/12/21   Tonye Becket D, NP  traZODone (DESYREL) 50 MG tablet TAKE 1 TABLET BY MOUTH AT BEDTIME Patient taking differently: Take 50 mg by mouth at bedtime. 02/03/22   Bensimhon, Bevelyn Buckles, MD  warfarin (COUMADIN) 5 MG tablet TAKE 1 TABLET BY MOUTH ONCE DAILY AS DIRECTED BY HF CLINIC 01/18/23   Bensimhon, Bevelyn Buckles, MD      Allergies    Ace inhibitors, Amiodarone, Diovan [valsartan], Lipitor [atorvastatin calcium], Norvasc [amlodipine], and Jardiance [empagliflozin]    Review of Systems   Review of Systems  All other systems reviewed and are negative.   Physical Exam Updated Vital Signs Pulse 81   Temp (!) 97.5 F (36.4 C) (Oral)   Resp 17   Ht 5\' 9"  (1.753 m)   Wt 75.8 kg   SpO2 100%   BMI 24.68 kg/m  Physical Exam Vitals and nursing note reviewed.  Constitutional:      General: He is not in acute distress.    Appearance: Normal appearance. He is well-developed.  HENT:     Head: Normocephalic and atraumatic.  Eyes:     Conjunctiva/sclera: Conjunctivae normal.     Pupils: Pupils are equal, round, and reactive to light.  Cardiovascular:     Heart sounds: Normal heart sounds.     Comments: LVAD in place Pulmonary:     Effort: Pulmonary effort is normal. No respiratory distress.     Breath sounds: Normal breath sounds.  Abdominal:     General: There is no distension.     Palpations: Abdomen is soft.     Tenderness: There is no abdominal tenderness.  Musculoskeletal:        General: No deformity. Normal range of motion.     Cervical back: Normal range of motion and neck supple.  Skin:    General: Skin is warm and dry.  Neurological:     General: No focal deficit present.     Mental Status: He is alert and oriented to person, place, and time.     ED  Results / Procedures / Treatments   Labs (all labs ordered are listed, but only abnormal results are displayed) Labs Reviewed  URINALYSIS, ROUTINE W REFLEX MICROSCOPIC  CBC  COMPREHENSIVE METABOLIC PANEL  LIPASE, BLOOD    EKG None  Radiology No results found.  Procedures Procedures  {Document cardiac monitor, telemetry assessment procedure when appropriate:1}  Medications Ordered in ED Medications - No data to display  ED Course/ Medical Decision Making/ A&P   {   Click here for ABCD2,  HEART and other calculatorsREFRESH Note before signing :1}                              Medical Decision Making Amount and/or Complexity of Data Reviewed Labs: ordered. Radiology: ordered.  Risk Prescription drug management.    Medical Screen Complete  This patient presented to the ED with complaint of low back pain.  This complaint involves an extensive number of treatment options. The initial differential diagnosis includes, but is not limited to, msk strain, rp bleed, fracture, etc  This presentation is: Acute, Self-Limited, Previously Undiagnosed, Uncertain Prognosis, Complicated, Systemic Symptoms, and Threat to Life/Bodily Function  Patient with acute low back pain  Patient with hx of LVAD - LVAD team aware of patient's arrival.  Screening labs/imaging ordered.   Oncoming EDP aware of case and need for disposition.    Additional history obtained:  [facility] obtained [facility] External records from outside sources obtained and reviewed including prior ED visits and prior Inpatient records.    Lab Tests:  I ordered and personally interpreted labs.  The pertinent results include:  cbc cmp inr ua   Imaging Studies ordered:  I ordered imaging studies including cxr ct ap ct lspine   I agree with the radiologist interpretation.  Problem List / ED Course:  Low back pain   Reevaluation:  After the interventions noted above, I reevaluated the patient  and found that they have: improved  Disposition:  After consideration of the diagnostic results and the patients response to treatment, I feel that the patent would benefit from completion of ED evaluation.    {Document critical care time when appropriate:1} {Document review of labs and clinical decision tools ie heart score, Chads2Vasc2 etc:1}  {Document your independent review of radiology images, and any outside records:1} {Document your discussion with family members, caretakers, and with consultants:1} {Document social determinants of health affecting pt's care:1} {Document your decision making why or why not admission, treatments were needed:1} Final Clinical Impression(s) / ED Diagnoses Final diagnoses:  Acute low back pain, unspecified back pain laterality, unspecified whether sciatica present    Rx / DC Orders ED Discharge Orders     None

## 2023-08-02 NOTE — Progress Notes (Signed)
PHARMACY - ANTICOAGULATION CONSULT NOTE  Pharmacy Consult for warfarin Indication:  LVAD  Allergies  Allergen Reactions   Ace Inhibitors Other (See Comments)    Hypotension and elevated creatinine   Amiodarone Other (See Comments)    Cerebellar side effects   Diovan [Valsartan] Other (See Comments)    Hypotension at low dose, hyperkalemia   Lipitor [Atorvastatin Calcium] Other (See Comments)    Muscle pain   Norvasc [Amlodipine] Other (See Comments)    hypotension   Jardiance [Empagliflozin] Nausea And Vomiting    Patient Measurements: Height: 5\' 9"  (175.3 cm) Weight: 75.8 kg (167 lb 1.7 oz) IBW/kg (Calculated) : 70.7 Heparin Dosing Weight: 75.8 kg  Vital Signs: Temp: 97.5 F (36.4 C) (12/02 1309) Temp Source: Oral (12/02 1309) BP: 124/0 (12/02 1312) Pulse Rate: 81 (12/02 1309)  Labs: Recent Labs    08/02/23 1323 08/02/23 1438  HGB 13.4  --   HCT 41.9  --   PLT 190  --   LABPROT  --  27.0*  INR  --  2.5*  CREATININE 2.31*  --     Estimated Creatinine Clearance: 25.9 mL/min (A) (by C-G formula based on SCr of 2.31 mg/dL (H)).   Medical History: Past Medical History:  Diagnosis Date   Anxiety    Automatic implantable cardioverter-defibrillator in situ    CAD (coronary artery disease)    S/P stenting of LCX in 2004;  s/p Lat MI 2009 with occlusion of the LCX - treated with Promus stenting. Has total occlusion of the RCA.    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 10/09 Echo: Sev LV Dysfxn, inf/lat AK, Mod MR.   Liver disease    LVAD (left ventricular assist device) present (HCC)    Osteoarthritis    a. s/p R TKR   Pacemaker    PVC (premature ventricular contraction)    RBBB    Shortness of breath     Medications:  Scheduled:   Assessment: 81  yom presenting with lower back pain and nausea. On warfarin PTA given hx of LVAD HM2 (LD 12/1 PM). Plan for non-contrast CT scan to evaluate back pain.  PTA regimen is 5 mg daily except 2.5 mg Mon/Fri.   Hgb 13.4, plt 190. No s/sx of bleeding documented.   Goal of Therapy:  INR 2.5-3 Monitor platelets by anticoagulation protocol: Yes   Plan:  Will order warfarin 2.5 mg tonight (as per PTA regimen) Monitor daily INR, CBC, and for s/sx of bleeding   Thank you for allowing pharmacy to participate in this patient's care,  Sherron Monday, PharmD, BCCCP Clinical Pharmacist  Phone: [phone] 08/02/2023 3:33 PM  Please check AMION for all [facility] Pharmacy phone numbers After 10:00 PM, call Main Pharmacy [phone]

## 2023-08-02 NOTE — H&P (Incomplete)
Advanced Heart Failure VAD History and Physical Note   PCP-Cardiologist: None   Reason for Admission: Sudden onset back pain  HPI:    Johnny Oneill is a 82 y.o. 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    Presented to ER on 06/27/19 with syncopal episode and head injury from fall. ECG showed VT @ 240. Underwent emergent DC-CV in ER. Head CT no bleed. Admitted and had ICD gen replacement (EOL). -> Did not tolerate amio due to tremors.   Had ICD shock for VT on 06/09/20. Started on sotalol   On 02/06/21 Paced out of AFL.   Seen in ER 05/2021 due to LOW FLOW alarms. Found to be in VT rate 170. DCCV x 1 - returned to SR with frequent PVCs. Pacer settings adjusted by EP- DDD 90.   RHC 11/22 to assess for RV failure: RA = 5, RV = 33/5, PA = 33/13 (20), PCW = 10, Fick cardiac output/index = 4.0/2.1, Thermo CO/CI = 4.9/2.6, PVR = 2.0 WU, Ao sat = 95% PA sat = 54%, 59%, PAPI = 4.0   Admitted 12/01/21 for R TKA. Did well with surgery but complicated by severe orthostatic hypotension. Midodrine added     Admitted 12/28/21 with recurrent VT. Had DC-CV in ER. Sotalol restarted. Continued to have elevated MAPs as well as orthostasis. Midodrine decreased and florinef added.    Admitted 02/09/22 with  a/c RV failure w/ cardiogenic shock. LFTs, INR, creatinine elevated on admit. Started on empiric milrinone with gradual improvement. LFTs/INR/creatinine improved. Had RHC primary left sided failure. LVAD speed optimized with speed increased to 9800.  Milrinone stopped without difficulty. INR followed closely by pharmacy with adjustments to coumadin.   This morning he paged the LVAD coordinators after he developed sudden onset of severe mid-lower back pain and nausea upon getting up and turning to get his breakfast. Sent to the ED for further evaluation. On arrival to the ED pain improved with morphine. Nausea better  with zofran. Imaging pending. So far labs/studies reviewed: UA (-), INR 2.5, K 5.1, SCr 2.31, Hgb 13.4.   CT C-spine and CT A/P pending  LVAD INTERROGATION:  HeartMate II LVAD:  Flow 3.7 liters/min, speed 9800, power 5.5, PI 5.6.  x15 PI events today    Home Medications Prior to Admission medications   Medication Sig Start Date End Date Taking? Authorizing Provider  acetaminophen (TYLENOL) 500 MG tablet Take 1,000 mg by mouth every 6 (six) hours as needed (for pain or headaches).   Yes [provider]  aspirin EC 81 MG tablet Take 81 mg by mouth in the morning.   Yes [provider]  Cholecalciferol (VITAMIN D3) 125 MCG (5000 UT) CAPS Take 5,000 Units by mouth daily with breakfast.   Yes [provider]  citalopram (CELEXA) 10 MG tablet Take 1 tablet by mouth once daily 07/01/23  Yes Bensimhon, Bevelyn Buckles, MD  Coenzyme Q10 (COQ10) 200 MG CAPS Take 200 mg by mouth in the morning.   Yes [provider]  doxycycline (VIBRAMYCIN) 100 MG capsule Take 1 capsule (100 mg total) by mouth 2 (two) times daily for 7 days. 07/26/23 08/02/23 Yes Bensimhon, Bevelyn Buckles, MD  Famotidine (PEPCID PO) Take 10 mg by mouth every morning.   Yes [provider]  levothyroxine (SYNTHROID) 175 MCG tablet Take 0.5-1 tablets (87.5-175 mcg total) by mouth See admin instructions. Take 1 tablet  daily every morning except half tablet on Sundays Patient taking differently: Take 175 mcg by mouth See admin instructions. Take 1 tablet daily every morning except half tablet on Sundays 03/09/23  Yes Reather Littler, MD  melatonin 5 MG TABS Take 5 mg by mouth at bedtime as needed (for sleep).   Yes [provider]  mexiletine (MEXITIL) 150 MG capsule Take 1 capsule by mouth twice daily 07/27/23  Yes Bensimhon, Bevelyn Buckles, MD  midodrine (PROAMATINE) 5 MG tablet TAKE 1 TABLET BY MOUTH TWICE DAILY AS NEEDED. TAKE 5 MG IF DOPPLER IS 90 OR LESS 03/08/23  Yes Bensimhon, Bevelyn Buckles, MD  pantoprazole  (PROTONIX) 40 MG tablet Take 1 tablet by mouth once daily 12/01/22  Yes Bensimhon, Bevelyn Buckles, MD  Probiotic Product (PROBIOTIC PO) Take 1 capsule by mouth in the morning.   Yes [provider]  rosuvastatin (CRESTOR) 20 MG tablet Take 1 tablet by mouth once daily 06/11/23  Yes Bensimhon, Bevelyn Buckles, MD  sotalol (BETAPACE) 80 MG tablet Take 1 tablet (80 mg total) by mouth daily. 03/16/23  Yes Bensimhon, Bevelyn Buckles, MD  traZODone (DESYREL) 50 MG tablet TAKE 1 TABLET BY MOUTH AT BEDTIME Patient taking differently: Take 50 mg by mouth at bedtime. 02/03/22  Yes Bensimhon, Bevelyn Buckles, MD  warfarin (COUMADIN) 5 MG tablet TAKE 1 TABLET BY MOUTH ONCE DAILY AS DIRECTED BY HF CLINIC Patient taking differently: Take 5 mg by mouth. Takes 1 tablet Sun, Tues, Wed, Thurs., and 1/2 tab on mon. and Friday only 01/18/23  Yes Bensimhon, Bevelyn Buckles, MD    Past Medical History: Past Medical History:  Diagnosis Date   Anxiety    Automatic implantable cardioverter-defibrillator in situ    CAD (coronary artery disease)    S/P stenting of LCX in 2004;  s/p Lat MI 2009 with occlusion of the LCX - treated with Promus stenting. Has total occlusion of the RCA.    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 10/09 Echo: Sev LV Dysfxn, inf/lat AK, Mod MR.   Liver disease    LVAD (left ventricular assist device) present (HCC)    Osteoarthritis    a. s/p R TKR   Pacemaker    PVC (premature ventricular contraction)    RBBB    Shortness of breath     Past Surgical History: Past Surgical History:  Procedure Laterality Date   CARPAL TUNNEL RELEASE Right 05/29/2016   Procedure: CARPAL TUNNEL RELEASE;  Surgeon: Frederico Hamman, MD;  Location: [facility] OR;  Service: Orthopedics;  Laterality: Right;   CORONARY ANGIOPLASTY WITH STENT  PLACEMENT  2004   CORONARY ANGIOPLASTY WITH STENT PLACEMENT  07/2008   DEFIBRILLATOR  2009   ESOPHAGOGASTRODUODENOSCOPY N/A 09/15/2013   Procedure: ESOPHAGOGASTRODUODENOSCOPY (EGD);  Surgeon: Meryl Dare, MD;  Location: [name] ENDOSCOPY;  Service: Endoscopy;  Laterality: N/A;  bedside   HEMATOMA EVACUATION Right 11/06/2013   Procedure: EVACUATION HEMATOMA;  Surgeon: Alleen Borne, MD;  Location: [facility] OR;  Service: Cardiothoracic;  Laterality: Right;   ICD GENERATOR CHANGEOUT N/A 06/28/2019   Procedure: ICD GENERATOR CHANGEOUT;  Surgeon: Regan Lemming, MD;  Location: [facility] INVASIVE CV LAB;  Service: Cardiovascular;  Laterality: N/A;   INSERT / REPLACE / REMOVE PACEMAKER  2009  INSERTION OF IMPLANTABLE LEFT VENTRICULAR ASSIST DEVICE N/A 09/18/2013   Procedure: INSERTION OF IMPLANTABLE LEFT VENTRICULAR ASSIST DEVICE;  Surgeon: Kerin Perna, MD;  Location: [facility] OR;  Service: Open Heart Surgery;  Laterality: N/A;  CIRC ARREST  NITRIC OXIDE   INTRA-AORTIC BALLOON PUMP INSERTION N/A 09/14/2013   Procedure: INTRA-AORTIC BALLOON PUMP INSERTION;  Surgeon: Dolores Patty, MD;  Location: [facility] CATH LAB;  Service: Cardiovascular;  Laterality: N/A;   INTRAOPERATIVE TRANSESOPHAGEAL ECHOCARDIOGRAM N/A 09/18/2013   Procedure: INTRAOPERATIVE TRANSESOPHAGEAL ECHOCARDIOGRAM;  Surgeon: Kerin Perna, MD;  Location: [facility] OR;  Service: Open Heart Surgery;  Laterality: N/A;   KNEE ARTHROSCOPY     bilaterally   KNEE ARTHROSCOPY W/ PARTIAL MEDIAL MENISCECTOMY  12/2002   left   LEFT VENTRICULAR ASSIST DEVICE     RIGHT HEART CATH N/A 07/28/2021   Procedure: RIGHT HEART CATH;  Surgeon: Dolores Patty, MD;  Location: [facility] INVASIVE CV LAB;  Service: Cardiovascular;  Laterality: N/A;   RIGHT HEART CATH N/A 02/12/2022   Procedure: RIGHT HEART CATH;  Surgeon: Dolores Patty, MD;  Location: [facility] INVASIVE CV LAB;  Service: Cardiovascular;  Laterality: N/A;   RIGHT HEART CATHETERIZATION N/A 08/25/2013   Procedure: RIGHT  HEART CATH;  Surgeon: Dolores Patty, MD;  Location: [facility] CATH LAB;  Service: Cardiovascular;  Laterality: N/A;   RIGHT HEART CATHETERIZATION N/A 09/13/2013   Procedure: RIGHT HEART CATH;  Surgeon: Dolores Patty, MD;  Location: [facility] CATH LAB;  Service: Cardiovascular;  Laterality: N/A;   RIGHT HEART CATHETERIZATION N/A 02/08/2014   Procedure: RIGHT HEART CATH;  Surgeon: Dolores Patty, MD;  Location: [facility] CATH LAB;  Service: Cardiovascular;  Laterality: N/A;   RIGHT HEART CATHETERIZATION N/A 02/12/2014   Procedure: RIGHT HEART CATH;  Surgeon: Dolores Patty, MD;  Location: [facility] CATH LAB;  Service: Cardiovascular;  Laterality: N/A;   TOTAL KNEE ARTHROPLASTY  11/27/11   left   TOTAL KNEE ARTHROPLASTY  11/27/2011   Procedure: TOTAL KNEE ARTHROPLASTY;  Surgeon: Thera Flake., MD;  Location: [facility] OR;  Service: Orthopedics;  Laterality: Left;  left total knee arthroplasty   TOTAL KNEE ARTHROPLASTY Right 12/03/2021   Procedure: TOTAL KNEE ARTHROPLASTY;  Surgeon: Frederico Hamman, MD;  Location: [facility] OR;  Service: Orthopedics;  Laterality: Right;   VIDEO ASSISTED THORACOSCOPY Right 11/06/2013   Procedure: VIDEO ASSISTED THORACOSCOPY;  Surgeon: Alleen Borne, MD;  Location: [facility] OR;  Service: Cardiothoracic;  Laterality: Right;    Family History: Family History  Problem Relation Age of Onset   Heart failure Father    Stroke Father    Coronary artery disease Mother    Heart disease Mother    Hyperlipidemia Sister     Social History: Social History   Socioeconomic History   Marital status: Married    Spouse name: Not on file   Number of children: 1   Years of education: Not on file   Highest education level: Not on file  Occupational History   Occupation: Building services engineer: RETIRED   Occupation: IT consultant  Tobacco Use   Smoking status: Never   Smokeless tobacco: Never  Vaping Use   Vaping status: Never Used  Substance and Sexual Activity   Alcohol use: No   Drug use: Not  Currently    Types: Marijuana    Comment: "back in our younger days we smoked a little pot"   Sexual activity: Not Currently  Other Topics Concern   Not on file  Social History Narrative   Not on file   Social Determinants of Health   Financial Resource Strain: Not on file  Food Insecurity: No Food Insecurity (12/17/2022)   Hunger Vital Sign    Worried About Running Out of Food in the Last Year: Never true    Ran Out of Food in the Last Year: Never true  Transportation Needs: No Transportation Needs (12/17/2022)   PRAPARE - Administrator, Civil Service (Medical): No    Lack of Transportation (Non-Medical): No  Physical Activity: Not on file  Stress: Not on file  Social Connections: Not on file    Allergies:  Allergies  Allergen Reactions   Ace Inhibitors Other (See Comments)    Hypotension and elevated creatinine   Amiodarone Other (See Comments)    Cerebellar side effects   Diovan [Valsartan] Other (See Comments)    Hypotension at low dose, hyperkalemia   Lipitor [Atorvastatin Calcium] Other (See Comments)    Muscle pain   Norvasc [Amlodipine] Other (See Comments)    hypotension   Jardiance [Empagliflozin] Nausea And Vomiting    Objective:    Vital Signs:   Temp:  [97.5 F (36.4 C)] 97.5 F (36.4 C) (12/02 1309) Pulse Rate:  [81] 81 (12/02 1309) Resp:  [17] 17 (12/02 1309) BP: (124)/(0) 124/0 (12/02 1312) SpO2:  [100 %] 100 % (12/02 1309) Weight:  [75.8 kg] 75.8 kg (12/02 1306)   Filed Weights   08/02/23 1306  Weight: 75.8 kg    Mean arterial Pressure 120s  Physical Exam  General:  Well appearing. No resp difficulty HEENT: Normal Neck: supple. JVP flat.  Cor: Mechanical heart sounds with LVAD hum present. Lungs: Clear Abdomen: soft, nontender, nondistended. No hepatosplenomegaly. No bruits or masses. Good bowel sounds. Driveline: C/D/I; securement device intact and driveline incorporated Extremities: no cyanosis, clubbing, rash,  edema Neuro: alert & orientedx3, cranial nerves grossly intact. moves all 4 extremities w/o difficulty. Affect pleasant  Telemetry   NSR 80s (Personally reviewed)    EKG   N/A  Labs    Basic Metabolic Panel: Recent Labs  Lab 08/02/23 1323  NA 136  K 5.1  CL 105  CO2 22  GLUCOSE 123*  BUN 43*  CREATININE 2.31*  CALCIUM 9.5    Liver Function Tests: Recent Labs  Lab 08/02/23 1323  AST 19  ALT 13  ALKPHOS 84  BILITOT 0.9  PROT 7.6  ALBUMIN 4.2   Recent Labs  Lab 08/02/23 1323  LIPASE 36   No results for input(s): "AMMONIA" in the last 168 hours.  CBC: Recent Labs  Lab 08/02/23 1323  WBC 7.9  HGB 13.4  HCT 41.9  MCV 92.1  PLT 190    Cardiac Enzymes: No results for input(s): "CKTOTAL", "CKMB", "CKMBINDEX", "TROPONINI" in the last 168 hours.  BNP: BNP (last 3 results) No results for input(s): "BNP" in the last 8760 hours.  ProBNP (last 3 results) No results for input(s): "PROBNP" in the last 8760 hours.   CBG: No results for input(s): "GLUCAP" in the last 168 hours.  Coagulation Studies: Recent Labs    08/02/23 1438  LABPROT 27.0*  INR 2.5*    Other results:   Imaging    No results found.  Patient Profile:  Johnny Oneill is a 82 y.o. 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 Admitted for severe sudden onset back pain.  Assessment/Plan:  Sudden onset back pain  - CT spine and A/P pending. UA (-) - pain resolved with morphine - ? Low back strain?  Chronic systolic heart failure: ICM, EF 20-25% s/p LVAD HM II implant 08/2013 for DT.   - Echo 3/18 EF 10-15% mild AI. Mod RV dysfunction. LVAD cannula OK - has struggled with RV failure however RHC 11/22 looked good (despite evidence of RVF on LE dopplers showing reflux - Doing well NYHA I-II. Much improved with midodrine - Appears euvolemic  -Failed Jardiance due to volume depletion - He has  not tolerated ACE-I or amlodipine or b-blocker in the past.  LVAD: HMII 08/2013 for DT. - VAD interrogated personally. Parameters stable. No further DL faults  -  in 1/61 had DL faults: VAD log files d/w industry. It appears there is a potential degrading conductor/wire connection in Phase A (same phase as previously noted on [id number] JUNE 24).  There was no interruption in pump support during these events.   - Continue aspirin and coumadin.  - DL site ok - MAPs elevated in setting of pain. - INR goal 2.0-3.0.  INR 2.5 today.  - Continue ASA 81 mg for LVAD + warfarin.   Nausea - resolved with zofran  DL fault alerts - multiple driveline fault notices on device interrogation in 9/24. Company rep contacted and log files sent. Controller changed but did not fix problem  - No further alarms today  HTN complicated by orthostatic hypotension - Much improved with close BP monitoring and midodrine.   VT/ Frequent PVCs - s/p ICD gen change.  - Off amio due to cerebellar side effects.  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 VT 06/16/21 with  DC-CV x 1. Recurrent VT 12/28/21 after stopping sotalol. Sotalol restarted. QT ok.  - PVCs improved on mexilitene. Continue mexilitene 150 bid - Keep K > 4.0 Mg > 2.0 - Continue to f/u with EP - Continue sotalol and mexilitene. QT interval ok  - No recent VT  PAF (flutter) - Patient paced out of AFL in 6/22. Atrial therapies enabled  - Continue sotalol (on for VT).  - In NSR today  Hypothyroid - Followed by Dr Lucianne Muss. Stable.No change   CAD - no s/s angina - continue Crestor  CKD IV - Creatinine baseline 1.8-2.4 - Today creatinine 2.31 - Failed Jardiance - Follows with Dr. Signe Colt   I reviewed the LVAD parameters from today, and compared the results to the patient's prior recorded data.  No programming changes were made.  The LVAD is functioning within specified parameters.  The patient performs LVAD self-test daily.  LVAD interrogation was negative for any  significant power changes, alarms or PI events/speed drops.  LVAD equipment check completed and is in good working order.  Back-up equipment present.   LVAD education done on emergency procedures and precautions and reviewed exit site care.  Length of Stay: 0  Cove Haydon, Dalbert Garnet, PA-C 08/02/2023, 5:12 PM  VAD Team Pager [phone] (7am - 7am) +++VAD ISSUES ONLY+++   Advanced Heart Failure Team Pager [phone] (M-F; 7a - 5p)  Please contact [facility] for night-coverage after hours (5p -7a ) and weekends on [email or web address] for all non- LVAD Issues

## 2023-08-02 NOTE — ED Triage Notes (Signed)
Pt c/o mid lower back pain and nausea started today. Pt denies urinary issues. Pt denies injury. Pt denies numbness or tingling. Pt denies loss of bowel or bladder.

## 2023-08-02 NOTE — Progress Notes (Signed)
LVAD Coordinator ED Encounter  Johnny Oneill a 82 y.o. male that presented to [facility] ER today due to severe lower back pain. He has a past medical history  has a past medical history of Anxiety, Automatic implantable cardioverter-defibrillator in situ, CAD (coronary artery disease), CHF (congestive heart failure) (HCC), Hypercholesteremia, Hypertension, Hypothyroidism, ICD (implantable cardiac defibrillator) in place, Inferior MI (HCC) ("? date"; 2009), Ischemic cardiomyopathy, Liver disease, LVAD (left ventricular assist device) present (HCC), Osteoarthritis, Pacemaker, PVC (premature ventricular contraction), RBBB, and Shortness of breath.[name]   LVAD is a HM 2 and was implanted on 09/18/13 by Dr Donata Clay for destination therapy.   Received call from patient's wife Johnny Oneill reporting new onset severe lower back pain and nausea that started around 1030 this morning. Stated they were on their way to ER for evaluation.  Met pt in ER. States nausea started shortly after he had breakfast. Back pain he rates 8-9/10 and constant. Denies burning, frequency, or incontinence of urination. Denies sick contacts. Denies lightheadedness, dizziness, shortness of breath, or signs of bleeding.   Plan for labs, UA, and non-contrast CT scan. Further workup depending on findings.   Vital signs: Temp: 97.6 HR: 81 SR Doppler MAP:  Automated BP: 143/123 (130) (pt took Midrodine 5 mg this morning) O2 Sat: 99% on RA  LVAD interrogation reveals:  Speed: 9800 Flow: 4.1 Power: 5.7  PI: 6.5  Alarms: none (chronic drive line fault- followed closely in clinic to monitor this)  Drive Line: Maintained by pt's wife Johnny Oneill. Has 2 days left to complete Doxycycline course for drive line redness.   Significant Events with LVAD:   Updated VAD Providers (Dr Shirlee Latch, Dr Gala Romney, and Anna Genre PA) about the above. No LVAD issues and pump is functioning as expected. Able to independently manage LVAD equipment. No LVAD needs at this  time.   Alyce Pagan RN VAD Coordinator  Office: [phone]  24/7 Pager: [phone]

## 2023-08-02 NOTE — ED Provider Triage Note (Signed)
Emergency Medicine Provider Triage Evaluation Note  Johnny Oneill , a 82 y.o. male  was evaluated in triage.  Pt complains of lower back pain starting this AM. Woke up feeling fine, was walking around his house when he felt a twinge in his lower back. Pain has gotten progressively worse since then. Has an LVAD and CKD  Review of Systems  Positive: Back pain, nausea Negative: Numbness, weakness, urinary sx, bowel changes  Physical Exam  Pulse 81   Temp (!) 97.5 F (36.4 C) (Oral)   Resp 17   Ht 5\' 9"  (1.753 m)   Wt 75.8 kg   SpO2 100%   BMI 24.68 kg/m  Gen:   Awake, no distress   Resp:  Normal effort  MSK:   Moves extremities without difficulty  Other:  Localizes pain to midline lower lumbar, no palpable deformity  Medical Decision Making  Medically screening exam initiated at 1:37 PM.  Appropriate orders placed.  Johnny Oneill was informed that the remainder of the evaluation will be completed by another provider, this initial triage assessment does not replace that evaluation, and the importance of remaining in the ED until their evaluation is complete.  Workup initiated   Johnny Monks, PA-C 08/02/23 1338

## 2023-08-02 NOTE — ED Notes (Signed)
Pt's manual palpated bp was 124. Pt is a LVAD pt

## 2023-08-02 NOTE — ED Provider Notes (Signed)
Patient getting a CT abdomen and pelvis and spine for low back pain.  Patient has history of LVAD. Physical Exam  BP (!) 124/0   Pulse 81   Temp (!) 97.5 F (36.4 C) (Oral)   Resp 17   Ht 5\' 9"  (1.753 m)   Wt 75.8 kg   SpO2 100%   BMI 24.68 kg/m   Physical Exam  Procedures  Procedures  ED Course / MDM    Medical Decision Making Amount and/or Complexity of Data Reviewed Labs: ordered. Radiology: ordered.  Risk Prescription drug management.  20: 55 significant delay in return of CT read for abdominal portion of CT scan.  No acute intra-abdominal findings identified.  I had personally visually reviewed the CT abdomen.   CT lumbosacral spine IMPRESSION: 1. No acute fracture or traumatic listhesis. 2. Left paracentral disc protrusions at L4-L5 and L5-S1 that likely obliterate the left lateral recesses at these levels. 3. Severe left neural foraminal narrowing at L4-L5 and L5-S1. 4. See separate CT abdomen/pelvis for additional findings.  Patient's pain and mechanism of onset were consistent with disc herniation.  Patient was treated with morphine prior to my taking over treatment plan.  Patient did get good pain control with morphine.  I have reviewed with him pain control at home.  He has not had Vicodin in the past after procedures.  At this time patient is given an oral dose of vicuna prior to discharge.  He will continue to use this as needed for pain control and follow-up on outpatient basis.       Arby Barrette, MD 08/02/23 2057

## 2023-08-13 ENCOUNTER — Other Ambulatory Visit (HOSPITAL_COMMUNITY): Payer: Self-pay | Admitting: *Deleted

## 2023-08-13 DIAGNOSIS — Z95811 Presence of heart assist device: Secondary | ICD-10-CM

## 2023-08-13 DIAGNOSIS — Z7901 Long term (current) use of anticoagulants: Secondary | ICD-10-CM

## 2023-08-18 ENCOUNTER — Ambulatory Visit (HOSPITAL_COMMUNITY)
Admission: RE | Admit: 2023-08-18 | Discharge: 2023-08-18 | Disposition: A | Discharge status: Home / Self Care | Payer: Medicare Other | Source: Ambulatory Visit | Attending: Cardiology | Admitting: Cardiology

## 2023-08-18 ENCOUNTER — Ambulatory Visit (HOSPITAL_COMMUNITY): Payer: Self-pay | Admitting: Pharmacist

## 2023-08-18 DIAGNOSIS — Z95811 Presence of heart assist device: Secondary | ICD-10-CM | POA: Insufficient documentation

## 2023-08-18 DIAGNOSIS — Z7901 Long term (current) use of anticoagulants: Secondary | ICD-10-CM | POA: Insufficient documentation

## 2023-08-18 LAB — PROTIME-INR
INR: 2.5 — ABNORMAL HIGH (ref 0.8–1.2)
Prothrombin Time: 27.4 s — ABNORMAL HIGH (ref 11.4–15.2)

## 2023-09-03 ENCOUNTER — Other Ambulatory Visit (HOSPITAL_COMMUNITY): Payer: Self-pay | Admitting: Unknown Physician Specialty

## 2023-09-03 DIAGNOSIS — Z7901 Long term (current) use of anticoagulants: Secondary | ICD-10-CM

## 2023-09-03 DIAGNOSIS — Z95811 Presence of heart assist device: Secondary | ICD-10-CM

## 2023-09-08 ENCOUNTER — Other Ambulatory Visit (HOSPITAL_COMMUNITY): Payer: Self-pay

## 2023-09-08 ENCOUNTER — Ambulatory Visit (HOSPITAL_COMMUNITY)
Admission: RE | Admit: 2023-09-08 | Discharge: 2023-09-08 | Disposition: A | Discharge status: Home / Self Care | Payer: Medicare Other | Source: Ambulatory Visit | Attending: Cardiology | Admitting: Cardiology

## 2023-09-08 ENCOUNTER — Ambulatory Visit (HOSPITAL_COMMUNITY): Payer: Self-pay | Admitting: Student-PharmD

## 2023-09-08 DIAGNOSIS — Z7901 Long term (current) use of anticoagulants: Secondary | ICD-10-CM | POA: Diagnosis not present

## 2023-09-08 DIAGNOSIS — Z4509 Encounter for adjustment and management of other cardiac device: Secondary | ICD-10-CM | POA: Insufficient documentation

## 2023-09-08 DIAGNOSIS — Z95811 Presence of heart assist device: Secondary | ICD-10-CM | POA: Insufficient documentation

## 2023-09-08 LAB — PROTIME-INR
INR: 1.7 — ABNORMAL HIGH (ref 0.8–1.2)
Prothrombin Time: 19.8 s — ABNORMAL HIGH (ref 11.4–15.2)

## 2023-09-15 ENCOUNTER — Ambulatory Visit (HOSPITAL_COMMUNITY)
Admission: RE | Admit: 2023-09-15 | Discharge: 2023-09-15 | Disposition: A | Discharge status: Home / Self Care | Payer: Medicare Other | Source: Ambulatory Visit | Attending: Cardiology | Admitting: Cardiology

## 2023-09-15 ENCOUNTER — Ambulatory Visit (HOSPITAL_COMMUNITY): Payer: Self-pay | Admitting: Pharmacist

## 2023-09-15 DIAGNOSIS — Z95811 Presence of heart assist device: Secondary | ICD-10-CM | POA: Insufficient documentation

## 2023-09-15 DIAGNOSIS — Z7901 Long term (current) use of anticoagulants: Secondary | ICD-10-CM | POA: Diagnosis not present

## 2023-09-15 LAB — PROTIME-INR
INR: 2.1 — ABNORMAL HIGH (ref 0.8–1.2)
Prothrombin Time: 23.4 s — ABNORMAL HIGH (ref 11.4–15.2)

## 2023-09-23 ENCOUNTER — Ambulatory Visit (INDEPENDENT_AMBULATORY_CARE_PROVIDER_SITE_OTHER): Payer: Medicare Other

## 2023-09-23 DIAGNOSIS — I255 Ischemic cardiomyopathy: Secondary | ICD-10-CM | POA: Diagnosis not present

## 2023-09-23 LAB — CUP PACEART REMOTE DEVICE CHECK
Battery Remaining Longevity: 25 mo
Battery Voltage: 2.94 V
Brady Statistic AP VP Percent: 92.32 %
Brady Statistic AP VS Percent: 0.29 %
Brady Statistic AS VP Percent: 7.35 %
Brady Statistic AS VS Percent: 0.03 %
Brady Statistic RA Percent Paced: 92.54 %
Brady Statistic RV Percent Paced: 99.61 %
Date Time Interrogation Session: 20250123012204
HighPow Impedance: 45 Ohm
HighPow Impedance: 58 Ohm
Implantable Lead Connection Status: 753985
Implantable Lead Connection Status: 753985
Implantable Lead Implant Date: 20091103
Implantable Lead Implant Date: 20091103
Implantable Lead Location: 753859
Implantable Lead Location: 753860
Implantable Lead Model: 5076
Implantable Lead Model: 6947
Implantable Pulse Generator Implant Date: 20201028
Lead Channel Impedance Value: 361 Ohm
Lead Channel Impedance Value: 418 Ohm
Lead Channel Impedance Value: 418 Ohm
Lead Channel Pacing Threshold Amplitude: 0.5 V
Lead Channel Pacing Threshold Amplitude: 1.25 V
Lead Channel Pacing Threshold Pulse Width: 0.4 ms
Lead Channel Pacing Threshold Pulse Width: 0.4 ms
Lead Channel Sensing Intrinsic Amplitude: 2.25 mV
Lead Channel Sensing Intrinsic Amplitude: 2.25 mV
Lead Channel Sensing Intrinsic Amplitude: 7.5 mV
Lead Channel Sensing Intrinsic Amplitude: 7.5 mV
Lead Channel Setting Pacing Amplitude: 1.5 V
Lead Channel Setting Pacing Amplitude: 2.75 V
Lead Channel Setting Pacing Pulse Width: 0.6 ms
Lead Channel Setting Sensing Sensitivity: 0.3 mV

## 2023-09-24 ENCOUNTER — Other Ambulatory Visit (HOSPITAL_COMMUNITY): Payer: Self-pay | Admitting: *Deleted

## 2023-09-24 DIAGNOSIS — Z95811 Presence of heart assist device: Secondary | ICD-10-CM

## 2023-09-24 DIAGNOSIS — I5022 Chronic systolic (congestive) heart failure: Secondary | ICD-10-CM

## 2023-09-24 DIAGNOSIS — Z7901 Long term (current) use of anticoagulants: Secondary | ICD-10-CM

## 2023-09-27 ENCOUNTER — Ambulatory Visit (HOSPITAL_COMMUNITY): Payer: Self-pay | Admitting: Pharmacist

## 2023-09-27 ENCOUNTER — Ambulatory Visit (HOSPITAL_COMMUNITY)
Admission: RE | Admit: 2023-09-27 | Discharge: 2023-09-27 | Disposition: A | Discharge status: Home / Self Care | Payer: Medicare Other | Source: Ambulatory Visit | Attending: Internal Medicine | Admitting: Internal Medicine

## 2023-09-27 VITALS — BP 105/90 | HR 80

## 2023-09-27 DIAGNOSIS — Z79899 Other long term (current) drug therapy: Secondary | ICD-10-CM | POA: Diagnosis not present

## 2023-09-27 DIAGNOSIS — I5022 Chronic systolic (congestive) heart failure: Secondary | ICD-10-CM | POA: Diagnosis not present

## 2023-09-27 DIAGNOSIS — I472 Ventricular tachycardia, unspecified: Secondary | ICD-10-CM

## 2023-09-27 DIAGNOSIS — I480 Paroxysmal atrial fibrillation: Secondary | ICD-10-CM

## 2023-09-27 DIAGNOSIS — I2510 Atherosclerotic heart disease of native coronary artery without angina pectoris: Secondary | ICD-10-CM

## 2023-09-27 DIAGNOSIS — Z4502 Encounter for adjustment and management of automatic implantable cardiac defibrillator: Secondary | ICD-10-CM | POA: Insufficient documentation

## 2023-09-27 DIAGNOSIS — Z7901 Long term (current) use of anticoagulants: Secondary | ICD-10-CM | POA: Diagnosis not present

## 2023-09-27 DIAGNOSIS — Z95811 Presence of heart assist device: Secondary | ICD-10-CM | POA: Diagnosis not present

## 2023-09-27 DIAGNOSIS — N184 Chronic kidney disease, stage 4 (severe): Secondary | ICD-10-CM

## 2023-09-27 LAB — CBC
HCT: 39.9 % (ref 39.0–52.0)
Hemoglobin: 13.1 g/dL (ref 13.0–17.0)
MCH: 29.7 pg (ref 26.0–34.0)
MCHC: 32.8 g/dL (ref 30.0–36.0)
MCV: 90.5 fL (ref 80.0–100.0)
Platelets: 164 10*3/uL (ref 150–400)
RBC: 4.41 MIL/uL (ref 4.22–5.81)
RDW: 15.9 % — ABNORMAL HIGH (ref 11.5–15.5)
WBC: 7.9 10*3/uL (ref 4.0–10.5)
nRBC: 0 % (ref 0.0–0.2)

## 2023-09-27 LAB — COMPREHENSIVE METABOLIC PANEL
ALT: 14 U/L (ref 0–44)
AST: 21 U/L (ref 15–41)
Albumin: 4 g/dL (ref 3.5–5.0)
Alkaline Phosphatase: 78 U/L (ref 38–126)
Anion gap: 9 (ref 5–15)
BUN: 37 mg/dL — ABNORMAL HIGH (ref 8–23)
CO2: 23 mmol/L (ref 22–32)
Calcium: 9.2 mg/dL (ref 8.9–10.3)
Chloride: 107 mmol/L (ref 98–111)
Creatinine, Ser: 2.75 mg/dL — ABNORMAL HIGH (ref 0.61–1.24)
GFR, Estimated: 23 mL/min — ABNORMAL LOW (ref >60)
Glucose, Bld: 92 mg/dL (ref 70–99)
Potassium: 4.8 mmol/L (ref 3.5–5.1)
Sodium: 139 mmol/L (ref 135–145)
Total Bilirubin: 0.9 mg/dL (ref 0.0–1.2)
Total Protein: 7.3 g/dL (ref 6.5–8.1)

## 2023-09-27 LAB — PROTIME-INR
INR: 2.7 — ABNORMAL HIGH (ref 0.8–1.2)
Prothrombin Time: 29.1 s — ABNORMAL HIGH (ref 11.4–15.2)

## 2023-09-27 LAB — LACTATE DEHYDROGENASE: LDH: 227 U/L — ABNORMAL HIGH (ref 98–192)

## 2023-09-27 LAB — TSH: TSH: 1.36 u[IU]/mL (ref 0.350–4.500)

## 2023-09-27 LAB — T4, FREE: Free T4: 1.4 ng/dL — ABNORMAL HIGH (ref 0.61–1.12)

## 2023-09-27 LAB — PREALBUMIN: Prealbumin: 23 mg/dL (ref 18–38)

## 2023-09-27 LAB — MAGNESIUM: Magnesium: 2.2 mg/dL (ref 1.7–2.4)

## 2023-09-27 MED ORDER — TRAZODONE HCL 50 MG PO TABS: 50.0000 mg | ORAL_TABLET | Freq: Every day | ORAL | 6 refills | Status: DC

## 2023-09-27 NOTE — Progress Notes (Addendum)
Patient presents for 2 month follow up with 10 year Intermacs and annual maintenance in VAD Clinic today alone. Reports no problems with VAD equipment or concerns with drive line.   Pt ambulated into clinic unassisted. Denies lightheadedness, falls, shortness of breath, and signs of bleeding. Reports dizziness when he bends over. This resolves with position change, and is not new for him. Pt endorses good appetite and hydration. He remains active without limitation.   He reports he is taking 5 mg of Midodrine daily in the morning and PRN in the afternoon but he has not taken any afternoon doses since we saw him last. His orthostatic symptoms have greatly improved since starting this. No changes today per Dr Gala Romney.   Pts CR is 2.75 today. VAD coordinator made Dr Gala Romney aware. Pt is following with [facility]. Pt states that the team there told him to stop drinking dark colored drinks and soda. Pt has follow up with their team. He is currently not taking any diuretics.  [address] [employer] noted and ongoing. Advised by [employer] Clinical rep and Engineer that since patient is asymptomatic to send log files at all follow up appointments to [facility] primary lead. Log files sent to [employer] today. Pt states he has not had any alarms on his controller and remains asymptomatic. Pt instructed again to call VAD Coordinator if any alarming occurs.   [employer] log file report:    The logfile for HMII patient JF contained the previously documented intermittent Driveline Faults. Additionally the log contained clusters of PI events as well as routine power source changes. PI Events occur when there are sudden and substantial changes in the pulsatility index, these events can be considered routine.   No other abnormal controller alarms or pump faults were seen in the log. The pump appears to be operating as intended.   Vital Signs:   Doppler Pressure: 104 Automatic BP: 105/90 (96) HR: 80 SPO2: UTO% on RA    Weight: 164.6 lb w/ eqt Last weight: 167.2 lb w/eqt  VAD Indication: Destination therapy- patient choice     VAD interrogation & Equipment Management: Speed: 9800 Flow: 4.5 Power: 5.9 w    PI: 5.3   Alarms: Drive line Fault Events: 16-10  Fixed speed: 9800  Low speed limit: 9200   Primary Controller:  Replace back up battery in 12 months Back up controller:  Replace back up battery in 28 months   Annual Equipment Maintenance on UBC/PM was performed 09/22/22   I reviewed the LVAD parameters from today and compared the results to the patient's prior recorded data. LVAD interrogation was NEGATIVE for significant power changes, NEGATIVE for clinical alarms and POSITIVE for PI events/speed drops. No programming changes were made and pump is functioning within specified parameters. Pt is performing daily controller and system monitor self tests along with completing weekly and monthly maintenance for LVAD equipment.  Exit Site Care: Drive line is being maintained weekly by [name], his wife. Existing VAD dressing C/D/I. Pt given 8 weekly kits for at home use.  Significant Events on VAD Support:  10/2013>SBO 12/2016> elevated LDH, admit for Bival   Device: Dual Medtronic Therapies:  on 231  Pacing: DDD 80 bpm  Last check:  12/24/2022  BP & Labs:  Doppler 104 - Doppler is reflecting modified systolic  Hgb 13.1 - No S/S of bleeding. Specifically denies melena/BRBPR or nosebleeds.   LDH 251- established baseline of 200- 350. Denies tea-colored urine. No power elevations noted on interrogation.  Batteries Manufacture Date: Number of uses: Re-calibration  08/18/21 42 Performed by patient  08/18/21 36 Performed by patient   Annual maintenance completed per [employer] on patient's home power module and universal Magazine features editor.    Backup system controller 11 volt battery charged during visit.   10 year Intermacs follow up completed including:  Quality of Life, KCCQ-12, and  Neurocognitive trail making.   Pt completed 1550 feet during 6 minute walk.   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 Ability to shower/bathe Not at all limited Not at all limited Quite a bit limited  1 b. Ability to walk 1 block Not at all limited Not at all limited Slightly limited  1 c. Ability to hurry/jog Moderately limited Slightly limited Slightly limited  2. Edema feet/ankles/legs Never over the past 2 weeks Never over the past 2 weeks Less than once a week  3. Limited by fatigue Less than once a week Less than once a week Less than once a week  4. Limited by dyspnea Less than once a week Less than once a week Less than once a week  5. Sitting up / on 3+ pillows Never over the past 2 weeks Never over the past 2 weeks Never over the past 2 weeks  6. Limited enjoyment of life Not limited at all Not limited at all Slightly limited  7. Rest of life w/ symptoms Completely satisfied Completely satisfied Completely satisfied  8 a. Participation in hobbies Slightly limited Slightly limited Did not limit at all  8 b. Participation in chores Did not limit at all Did not limit at all Slightly limited  8 c. Visiting family/friends Did not limit at all Did not limit at all Did not limit at all      Patient Instructions:  No medication changes Coumadin dosing per Lauren PharmD Return for INR and Bmet in 2 weeks Return to VAD clinic in 2 months for follow up with Dr Gala Romney  Carlton Adam RN VAD Coordinator  Office: [phone]  24/7 Pager: [phone]

## 2023-09-28 ENCOUNTER — Encounter: Payer: Self-pay | Admitting: Neurology

## 2023-09-28 LAB — T3, FREE: T3, Free: 2.3 pg/mL (ref 2.0–4.4)

## 2023-09-29 NOTE — Progress Notes (Signed)
VAD CLINIC NOTE    HPI:  Johnny Oneill is a 83 y.o. 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   Presented to ER on 06/27/19 with syncopal episode and head injury from fall. ECG showed VT @ 240. Underwent emergent DC-CV in ER. Head CT no bleed. Admitted and had ICD gen replacement (EOL). -> Did not tolerate amio due to tremors.  Had ICD shock for VT on 06/09/20. Started on sotalol  On 02/06/21 Paced out of AFL.  Seen in ER 05/2021 due to LOW FLOW alarms. Found to be in VT rate 170. DCCV x 1 - returned to SR with frequent PVCs. Pacer settings adjusted by Otilio Saber PA- DDD 90.  RHC 11/22 to assess for RV failure RA = 5 RV = 33/5 PA = 33/13 (20) PCW = 10 Fick cardiac output/index = 4.0/2.1 Thermo CO/CI = 4.9/2.6 PVR = 2.0 WU Ao sat = 95% PA sat = 54%, 59% PAPI = 4.0  Admitted 12/01/21 for R TKA. Did well with surgery but complicated by severe orthostatic hypotension. Midodrine added    Admitted 12/28/21 with recurrent VT. Had DC-CV in ER. Sotalol restarted. Continued to have elevated MAPs as well as orthostasis. Midodrine decreased and florinef added.   Admitted 02/09/22 with  a/c RV failure w/ cardiogenic shock. LFTs, INR, creatinine elevated on admit. Started on empiric milrinone with gradual improvement. LFTs/INR/creatinine improved. Had RHC primary left sided failure. LVAD speed optimized with speed increased to 9800.  Milrinone stopped without difficulty. INR followed closely by pharmacy with adjustments to coumadin.   Here for f/u. Doing great. Remains active. Taking 1 midodrine every am. Has not needed any in the am. Denies orthopnea or PND. No fevers, chills or problems with driveline. No bleeding, melena or neuro symptoms. No VAD alarms. Taking all meds as prescribed.    VAD Indication: Destination therapy- patient choice     VAD interrogation & Equipment Management: Speed:  9800 Flow: 4.5 Power: 5.9 w    PI: 5.3   Alarms: Drive line Fault Events: 40-98  Fixed speed: 9800  Low speed limit: 9200   Primary Controller:  Replace back up battery in 12 months Back up controller:  Replace back up battery in 28 months   Annual Equipment Maintenance on UBC/PM was performed 09/22/22   I reviewed the LVAD parameters from today and compared the results to the patient's prior recorded data. LVAD interrogation was NEGATIVE for significant power changes, NEGATIVE for clinical alarms and POSITIVE for PI events/speed drops. No programming changes were made and pump is functioning within specified parameters. Pt is performing daily controller and system monitor self tests along with completing weekly and monthly maintenance for LVAD equipment.    Past Medical History:  Diagnosis Date   Anxiety    Automatic implantable cardioverter-defibrillator in situ    CAD (coronary artery disease)    S/P stenting of LCX in 2004;  s/p Lat MI 2009 with occlusion of the LCX - treated with Promus stenting. Has total occlusion of the RCA.    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 10/09 Echo: Sev LV Dysfxn, inf/lat AK, Mod  MR.   Liver disease    LVAD (left ventricular assist device) present (HCC)    Osteoarthritis    a. s/p R TKR   Pacemaker    PVC (premature ventricular contraction)    RBBB    Shortness of breath     Current Outpatient Medications  Medication Sig Dispense Refill   acetaminophen (TYLENOL) 500 MG tablet Take 1,000 mg by mouth every 6 (six) hours as needed (for pain or headaches).     aspirin EC 81 MG tablet Take 81 mg by mouth in the morning.     Cholecalciferol (VITAMIN D3) 125 MCG (5000 UT) CAPS Take 5,000 Units by mouth daily with breakfast.     citalopram  (CELEXA) 10 MG tablet Take 1 tablet by mouth once daily 90 tablet 3   Coenzyme Q10 (COQ10) 200 MG CAPS Take 200 mg by mouth in the morning.     Famotidine (PEPCID PO) Take 10 mg by mouth every morning.     levothyroxine (SYNTHROID) 175 MCG tablet Take 0.5-1 tablets (87.5-175 mcg total) by mouth See admin instructions. Take 1 tablet daily every morning except half tablet on Sundays (Patient taking differently: Take 175 mcg by mouth See admin instructions. Take 1 tablet daily every morning except half tablet on Sundays) 90 tablet 2   melatonin 5 MG TABS Take 5 mg by mouth at bedtime as needed (for sleep).     mexiletine (MEXITIL) 150 MG capsule Take 1 capsule by mouth twice daily 60 capsule 3   midodrine (PROAMATINE) 5 MG tablet TAKE 1 TABLET BY MOUTH TWICE DAILY AS NEEDED. TAKE 5 MG IF DOPPLER IS 90 OR LESS (Patient taking differently: Take 5 mg by mouth daily.) 60 tablet 6   pantoprazole (PROTONIX) 40 MG tablet Take 1 tablet by mouth once daily 90 tablet 3   Probiotic Product (PROBIOTIC PO) Take 1 capsule by mouth in the morning.     rosuvastatin (CRESTOR) 20 MG tablet Take 1 tablet by mouth once daily 90 tablet 3   sotalol (BETAPACE) 80 MG tablet Take 1 tablet (80 mg total) by mouth daily. 90 tablet 3   warfarin (COUMADIN) 5 MG tablet TAKE 1 TABLET BY MOUTH ONCE DAILY AS DIRECTED BY HF CLINIC (Patient taking differently: Take 5 mg by mouth. Takes 1 tablet Sun, Tues, Wed, Thurs., and 1/2 tab on mon. and Friday only) 90 tablet 6   HYDROcodone-acetaminophen (NORCO/VICODIN) 5-325 MG tablet Take 1 tablet by mouth every 4 (four) hours as needed. (Patient not taking: Reported on 09/27/2023) 15 tablet 0   traZODone (DESYREL) 50 MG tablet Take 1 tablet (50 mg total) by mouth at bedtime. 30 tablet 6   No current facility-administered medications for this encounter.   Facility-Administered Medications Ordered in Other Encounters  Medication Dose Route Frequency Provider Last Rate Last Admin   etomidate  (AMIDATE) injection    Anesthesia Intra-op Lucinda Dell, CRNA   20 mg at 02/08/14 0915   rocuronium [facility]) injection    Anesthesia Intra-op Lucinda Dell, CRNA   50 mg at 02/08/14 0932   succinylcholine (ANECTINE) injection    Anesthesia Intra-op Lucinda Dell, CRNA   100 mg at 02/08/14 0915   Ace inhibitors, Amiodarone, Diovan [valsartan], Lipitor [atorvastatin calcium], Norvasc [amlodipine], and Jardiance [empagliflozin]  Review of systems complete and found to be negative unless listed in HPI.     Wt Readings from Last 3 Encounters:  08/02/23 75.8 kg (167 lb 1.7 oz)  07/26/23 75.8 kg (167  lb 3.2 oz)  05/22/23 73 kg (161 lb)    Vital Signs:   Doppler Pressure: 104 Automatic BP: 105/90 (96) HR: 80 SPO2: UTO% on RA   Weight: 164.6 lb w/ eqt Last weight: 167.2 lb w/eqt    Physical Exam: General:  NAD.  HEENT: normal  Neck: supple. JVP not elevated.  Carotids 2+ bilat; no bruits. No lymphadenopathy or thryomegaly appreciated. Cor: LVAD hum.  Lungs: Clear. Abdomen: soft, nontender, non-distended. No hepatosplenomegaly. No bruits or masses. Good bowel sounds. Driveline site clean. Anchor in place.  Extremities: no cyanosis, clubbing, rash. Warm no edema  Neuro: alert & oriented x 3. No focal deficits. Moves all 4 without problem    ASSESSMENT AND PLAN:   1) Chronic systolic HF: ICM, EF 20-25% s/p LVAD HM II implant 08/2013 for DT.   - Echo 3/18 EF 10-15% mild AI. Mod RV dysfunction. LVAD cannula OK - has struggled with RV failure however RHC 11/22 looked good (despite evidence of RVF on LE dopplers showing reflux - Doing well. NYHA I-II. Much improved with midodrine - Volume status looks great   -Failed Jardiance due to volume depletion - He has not tolerated ACE-I or amlodipine or b-blocker in the past.  2) LVAD: HMII 08/2013 for DT. - VAD interrogated personally. Parameters stable. No further DL faults -  in 1/61 had DL faults: VAD log files d/w  industry. It appears there is a potential degrading conductor/wire connection in Phase A (same phase as previously noted on [id number] JUNE 24).  There was no interruption in pump support during these events.   - Remains on ASA and warfarin (HM-2) - LDH 227 - INR 2.7 Goal 2.0-2.5 Discussed warfarin dosing with PharmD personally. - DL site ok - MAPs ok.Continue midodrine  3) Driveline fault alerts - multiple driveline fault notices on device interrogation in 9/24. Company rep contacted and log files sent. Controller changed but did not fix problem  - No recurrence   3) HTN complicated by orthostatic hypotension - Much improved with close BP monitoring and midodrine - no change  4) VT/Frequent PVCs - s/p ICD gen change.  - Off amio due to cerebellar side effects.  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 - VT  06/16/21 with  DC-CV x 1 - Recurrent VT 12/28/21 after stopping sotalol. Sotalol restarted. - PVCs improved on mexilitene. Continue mexilitene 150 bid - Keep K > 4.0 Mg > 2.0 - No recent VT - Continue sotalol and mexilitene. Continue to follow with EP - QT ok - Watch sotalol dosing with CKD (Scr up to 2.7 today)  5) Atrial flutter, paroxysmal - patient paced out of AFL in 6/22. Atrial therapies enabled  - continue sotalol (on for VT). QTs slightly prolonged but stable - Remains in NSR today  6) Hypothyroidism:  - Followed by Dr Lucianne Muss. Stable.No change   7) CAD - no s/s angina - continue Crestor  9) CKD IV - Creatinine baseline 1.8-2.4 -Today creatinine 2.42 -> 2.75 - failed Jardiance - Follows with Dr. Signe Colt - Need to follow closely. Increase fluid intake  I spent a total of 45 minutes today: 1) reviewing the patient's medical records including previous charts, labs and recent notes from other providers; 2) examining the patient and counseling them on their medical issues/explaining the plan of care; 3) adjusting meds as needed and 4) ordering lab work or other needed tests.     Arvilla Meres, MD  7:00 PM

## 2023-10-14 ENCOUNTER — Other Ambulatory Visit (HOSPITAL_COMMUNITY): Payer: Self-pay

## 2023-10-14 DIAGNOSIS — Z95811 Presence of heart assist device: Secondary | ICD-10-CM

## 2023-10-14 DIAGNOSIS — Z7901 Long term (current) use of anticoagulants: Secondary | ICD-10-CM

## 2023-10-17 NOTE — Addendum Note (Signed)
Encounter addended by: Dolores Patty, MD on: 10/17/2023 10:31 AM  Actions taken: Clinical Note Signed, Level of Service modified, Visit diagnoses modified, Charge Capture section accepted

## 2023-10-20 ENCOUNTER — Ambulatory Visit (HOSPITAL_COMMUNITY)
Admission: RE | Admit: 2023-10-20 | Discharge: 2023-10-20 | Disposition: A | Discharge status: Home / Self Care | Payer: Medicare Other | Source: Ambulatory Visit | Attending: Cardiology

## 2023-10-20 ENCOUNTER — Ambulatory Visit (HOSPITAL_COMMUNITY): Payer: Self-pay | Admitting: Pharmacist

## 2023-10-20 DIAGNOSIS — Z95811 Presence of heart assist device: Secondary | ICD-10-CM | POA: Insufficient documentation

## 2023-10-20 DIAGNOSIS — Z4509 Encounter for adjustment and management of other cardiac device: Secondary | ICD-10-CM | POA: Insufficient documentation

## 2023-10-20 DIAGNOSIS — Z7901 Long term (current) use of anticoagulants: Secondary | ICD-10-CM | POA: Insufficient documentation

## 2023-10-20 LAB — BASIC METABOLIC PANEL
Anion gap: 9 (ref 5–15)
BUN: 35 mg/dL — ABNORMAL HIGH (ref 8–23)
CO2: 23 mmol/L (ref 22–32)
Calcium: 9.2 mg/dL (ref 8.9–10.3)
Chloride: 103 mmol/L (ref 98–111)
Creatinine, Ser: 2.38 mg/dL — ABNORMAL HIGH (ref 0.61–1.24)
GFR, Estimated: 27 mL/min — ABNORMAL LOW (ref >60)
Glucose, Bld: 143 mg/dL — ABNORMAL HIGH (ref 70–99)
Potassium: 4.9 mmol/L (ref 3.5–5.1)
Sodium: 135 mmol/L (ref 135–145)

## 2023-10-20 LAB — PROTIME-INR
INR: 2.7 — ABNORMAL HIGH (ref 0.8–1.2)
Prothrombin Time: 28.9 s — ABNORMAL HIGH (ref 11.4–15.2)

## 2023-10-21 ENCOUNTER — Telehealth (HOSPITAL_COMMUNITY): Payer: Self-pay

## 2023-10-21 ENCOUNTER — Other Ambulatory Visit (HOSPITAL_COMMUNITY): Payer: Self-pay

## 2023-10-21 MED ORDER — PREDNISONE 10 MG PO TABS: 40.0000 mg | ORAL_TABLET | Freq: Every day | ORAL | 0 refills | Status: AC

## 2023-10-21 NOTE — Telephone Encounter (Signed)
Pt wife called VAD Clinic reporting pt has "gout like pain" in his left foot. Discussed with Dr. Gala Romney. Orders placed for 40mg  of Prednisone daily with meals for 3 days. Rx sent to [facility] on [address].  Simmie Davies RN, BSN VAD Coordinator 24/7 Pager [phone]

## 2023-11-03 ENCOUNTER — Other Ambulatory Visit (HOSPITAL_COMMUNITY): Payer: Self-pay

## 2023-11-03 DIAGNOSIS — Z95811 Presence of heart assist device: Secondary | ICD-10-CM

## 2023-11-03 DIAGNOSIS — Z7901 Long term (current) use of anticoagulants: Secondary | ICD-10-CM

## 2023-11-04 NOTE — Progress Notes (Signed)
 Remote ICD transmission.

## 2023-11-10 ENCOUNTER — Ambulatory Visit (HOSPITAL_COMMUNITY)
Admission: RE | Admit: 2023-11-10 | Discharge: 2023-11-10 | Disposition: A | Discharge status: Home / Self Care | Payer: Medicare Other | Source: Ambulatory Visit | Attending: Cardiology | Admitting: Cardiology

## 2023-11-10 ENCOUNTER — Ambulatory Visit (HOSPITAL_COMMUNITY): Payer: Self-pay | Admitting: Pharmacist

## 2023-11-10 DIAGNOSIS — Z95811 Presence of heart assist device: Secondary | ICD-10-CM | POA: Diagnosis present

## 2023-11-10 DIAGNOSIS — Z7901 Long term (current) use of anticoagulants: Secondary | ICD-10-CM | POA: Insufficient documentation

## 2023-11-10 DIAGNOSIS — Z4509 Encounter for adjustment and management of other cardiac device: Secondary | ICD-10-CM | POA: Diagnosis present

## 2023-11-10 LAB — PROTIME-INR
INR: 2.4 — ABNORMAL HIGH (ref 0.8–1.2)
Prothrombin Time: 26.5 s — ABNORMAL HIGH (ref 11.4–15.2)

## 2023-11-12 ENCOUNTER — Telehealth (HOSPITAL_COMMUNITY): Payer: Self-pay | Admitting: Unknown Physician Specialty

## 2023-11-12 MED ORDER — PREDNISONE 20 MG PO TABS: 40.0000 mg | ORAL_TABLET | Freq: Every day | ORAL | 0 refills | Status: AC

## 2023-11-12 NOTE — Telephone Encounter (Addendum)
 Received call from pts wife stating that the pt is having another gout flare. She states that DR Bensimhon prescribed Prednisone 2/20 that helped but now the gout is back in his fingers. D/w DR Benismhon script sent in for Prednisone 40 mg x 3 days. Will also refer pt to DR [facility] office to aid in managing pts gout issues.  Referral faxed to DR [facility] office. Fax # [phone]  Carlton Adam RN, BSN VAD Coordinator 24/7 Pager [phone]

## 2023-11-15 ENCOUNTER — Other Ambulatory Visit (HOSPITAL_COMMUNITY): Payer: Self-pay

## 2023-11-18 ENCOUNTER — Encounter (HOSPITAL_COMMUNITY): Payer: Self-pay | Admitting: Unknown Physician Specialty

## 2023-11-18 NOTE — Progress Notes (Signed)
 Received fax from [facility] rheumatology. Pt has appt 4/25 at 0830 for gout.  Carlton Adam RN, BSN VAD Coordinator 24/7 Pager [phone]

## 2023-11-19 ENCOUNTER — Telehealth (HOSPITAL_COMMUNITY): Payer: Self-pay | Admitting: *Deleted

## 2023-11-19 ENCOUNTER — Other Ambulatory Visit (HOSPITAL_COMMUNITY): Payer: Self-pay | Admitting: *Deleted

## 2023-11-19 DIAGNOSIS — Z7901 Long term (current) use of anticoagulants: Secondary | ICD-10-CM

## 2023-11-19 DIAGNOSIS — Z95811 Presence of heart assist device: Secondary | ICD-10-CM

## 2023-11-19 DIAGNOSIS — M1A0410 Idiopathic chronic gout, right hand, without tophus (tophi): Secondary | ICD-10-CM

## 2023-11-19 DIAGNOSIS — R52 Pain, unspecified: Secondary | ICD-10-CM

## 2023-11-19 DIAGNOSIS — I5022 Chronic systolic (congestive) heart failure: Secondary | ICD-10-CM

## 2023-11-19 MED ORDER — PREDNISONE 20 MG PO TABS: 40.0000 mg | ORAL_TABLET | Freq: Every day | ORAL | 0 refills | Status: AC

## 2023-11-19 NOTE — Addendum Note (Signed)
 Addended by: Alyce Pagan B on: 11/19/2023 11:19 AM   Modules accepted: Orders

## 2023-11-19 NOTE — Telephone Encounter (Addendum)
 Received call from patient's wife reporting pt's hand is red, swollen, and very painful. Recently completed Prednisone 40 mg x 3 days for gout flair. Referral was sent to Dr Shawnee Knapp office- appt scheduled 12/24/23.   Updated Dr Gala Romney about the above. He discussed pt with Dr Dierdre Forth and pt's appt has been rescheduled to 11/23/23. Order received for Prednisone 40 mg daily until he is seen by rheumatology. Prescription sent to pt's pharmacy.  Spoke with [name] about the above. Confirmed she is aware of new appt as she just spoke with Dr Shawnee Knapp office, and prescription. She verbalized understanding to all the above.   Alyce Pagan RN VAD Coordinator  Office: [phone]  24/7 Pager: [phone]

## 2023-11-22 ENCOUNTER — Ambulatory Visit (HOSPITAL_COMMUNITY)
Admission: RE | Admit: 2023-11-22 | Discharge: 2023-11-22 | Disposition: A | Discharge status: Home / Self Care | Source: Ambulatory Visit | Attending: Internal Medicine | Admitting: Internal Medicine

## 2023-11-22 ENCOUNTER — Encounter (HOSPITAL_COMMUNITY): Payer: Medicare Other | Admitting: Internal Medicine

## 2023-11-22 ENCOUNTER — Ambulatory Visit (HOSPITAL_COMMUNITY): Payer: Self-pay | Admitting: Pharmacist

## 2023-11-22 VITALS — BP 118/0 | HR 90 | Wt 165.0 lb

## 2023-11-22 DIAGNOSIS — I5022 Chronic systolic (congestive) heart failure: Secondary | ICD-10-CM | POA: Insufficient documentation

## 2023-11-22 DIAGNOSIS — I472 Ventricular tachycardia, unspecified: Secondary | ICD-10-CM

## 2023-11-22 DIAGNOSIS — I2510 Atherosclerotic heart disease of native coronary artery without angina pectoris: Secondary | ICD-10-CM

## 2023-11-22 DIAGNOSIS — I480 Paroxysmal atrial fibrillation: Secondary | ICD-10-CM | POA: Diagnosis not present

## 2023-11-22 DIAGNOSIS — N184 Chronic kidney disease, stage 4 (severe): Secondary | ICD-10-CM

## 2023-11-22 DIAGNOSIS — Z95811 Presence of heart assist device: Secondary | ICD-10-CM | POA: Diagnosis not present

## 2023-11-22 DIAGNOSIS — Z7901 Long term (current) use of anticoagulants: Secondary | ICD-10-CM | POA: Diagnosis not present

## 2023-11-22 LAB — HEPATIC FUNCTION PANEL
ALT: 16 U/L (ref 0–44)
AST: 18 U/L (ref 15–41)
Albumin: 3.7 g/dL (ref 3.5–5.0)
Alkaline Phosphatase: 71 U/L (ref 38–126)
Bilirubin, Direct: <0.1 mg/dL (ref 0.0–0.2)
Total Bilirubin: 0.7 mg/dL (ref 0.0–1.2)
Total Protein: 6.8 g/dL (ref 6.5–8.1)

## 2023-11-22 LAB — CBC
HCT: 38.1 % — ABNORMAL LOW (ref 39.0–52.0)
Hemoglobin: 12.2 g/dL — ABNORMAL LOW (ref 13.0–17.0)
MCH: 29 pg (ref 26.0–34.0)
MCHC: 32 g/dL (ref 30.0–36.0)
MCV: 90.7 fL (ref 80.0–100.0)
Platelets: 209 10*3/uL (ref 150–400)
RBC: 4.2 MIL/uL — ABNORMAL LOW (ref 4.22–5.81)
RDW: 15.5 % (ref 11.5–15.5)
WBC: 13 10*3/uL — ABNORMAL HIGH (ref 4.0–10.5)
nRBC: 0 % (ref 0.0–0.2)

## 2023-11-22 LAB — BASIC METABOLIC PANEL
Anion gap: 12 (ref 5–15)
BUN: 45 mg/dL — ABNORMAL HIGH (ref 8–23)
CO2: 24 mmol/L (ref 22–32)
Calcium: 9.6 mg/dL (ref 8.9–10.3)
Chloride: 102 mmol/L (ref 98–111)
Creatinine, Ser: 2.29 mg/dL — ABNORMAL HIGH (ref 0.61–1.24)
GFR, Estimated: 28 mL/min — ABNORMAL LOW (ref >60)
Glucose, Bld: 86 mg/dL (ref 70–99)
Potassium: 4.5 mmol/L (ref 3.5–5.1)
Sodium: 138 mmol/L (ref 135–145)

## 2023-11-22 LAB — LACTATE DEHYDROGENASE: LDH: 208 U/L — ABNORMAL HIGH (ref 98–192)

## 2023-11-22 LAB — PROTIME-INR
INR: 3 — ABNORMAL HIGH (ref 0.8–1.2)
Prothrombin Time: 31.6 s — ABNORMAL HIGH (ref 11.4–15.2)

## 2023-11-22 LAB — MAGNESIUM: Magnesium: 2.2 mg/dL (ref 1.7–2.4)

## 2023-11-22 NOTE — Progress Notes (Signed)
 VAD CLINIC NOTE    HPI:  Johnny Oneill is a 83 y.o. 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   Presented to ER on 06/27/19 with syncopal episode and head injury from fall. ECG showed VT @ 240. Underwent emergent DC-CV in ER. Head CT no bleed. Admitted and had ICD gen replacement (EOL). -> Did not tolerate amio due to tremors.  Had ICD shock for VT on 06/09/20. Started on sotalol  On 02/06/21 Paced out of AFL.  Seen in ER 05/2021 due to LOW FLOW alarms. Found to be in VT rate 170. DCCV x 1 - returned to SR with frequent PVCs. Pacer settings adjusted by Otilio Saber PA- DDD 90.  RHC 11/22 to assess for RV failure RA = 5 RV = 33/5 PA = 33/13 (20) PCW = 10 Fick cardiac output/index = 4.0/2.1 Thermo CO/CI = 4.9/2.6 PVR = 2.0 WU Ao sat = 95% PA sat = 54%, 59% PAPI = 4.0  Admitted 12/01/21 for R TKA. Did well with surgery but complicated by severe orthostatic hypotension. Midodrine added    Admitted 12/28/21 with recurrent VT. Had DC-CV in ER. Sotalol restarted. Continued to have elevated MAPs as well as orthostasis. Midodrine decreased and florinef added.   Admitted 02/09/22 with  a/c RV failure w/ cardiogenic shock. LFTs, INR, creatinine elevated on admit. Started on empiric milrinone with gradual improvement. LFTs/INR/creatinine improved. Had RHC primary left sided failure. LVAD speed optimized with speed increased to 9800.  Milrinone stopped without difficulty. INR followed closely by pharmacy with adjustments to coumadin.   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 Back on prednisone. Pending appt with Rheum this week   Here for f/u. Doing very well. Only complaint is recurrent gout in his left ring finger. Only responds to prednisone. Denies orthopnea or PND. No fevers, chills or problems with driveline. Takes midodrine 1x per day. No bleeding, melena or neuro symptoms. No VAD  alarms or ICD firings. Taking all meds as prescribed.     VAD Indication: Destination therapy- patient choice     VAD interrogation & Equipment Management: Speed: 9800 Flow: 4.5 Power: 5.9w  PI: 5.3   Alarms: Drive line Fault Events: 40-98  Fixed speed: 9800  Low speed limit: 9200   Primary Controller:  Replace back up battery in 12 months Back up controller:  Replace back up battery in 28 months   Annual Equipment Maintenance on UBC/PM was performed 09/22/22   I reviewed the LVAD parameters from today and compared the results to the patient's prior recorded data. LVAD interrogation was NEGATIVE for significant power changes, NEGATIVE for clinical alarms and POSITIVE for PI events/speed drops. No programming changes were made and pump is functioning within specified parameters. Pt is performing daily controller and system monitor self tests along with completing weekly and monthly maintenance for LVAD equipment.   Past Medical History:  Diagnosis Date   Anxiety    Automatic implantable cardioverter-defibrillator in situ    CAD (coronary artery disease)    S/P stenting of LCX in 2004;  s/p Lat MI 2009 with occlusion of the LCX - treated with Promus stenting. Has total occlusion of the RCA.    CHF (congestive heart failure) (HCC)    Hypercholesteremia    Hypertension    Hypothyroidism    ICD (implantable cardiac defibrillator) in place    PROPHYLACTIC  medtronic   Inferior MI (HCC) "? date"; 2009   Ischemic cardiomyopathy    a. 10/09 Echo: Sev LV Dysfxn, inf/lat AK, Mod MR.   Liver disease    LVAD (left ventricular assist device) present (HCC)    Osteoarthritis    a. s/p R TKR   Pacemaker    PVC (premature ventricular contraction)    RBBB    Shortness of breath     Current Outpatient Medications  Medication Sig Dispense Refill   acetaminophen (TYLENOL) 500 MG tablet Take 1,000 mg by mouth every 6 (six) hours as needed (for pain or headaches).     aspirin EC 81  MG tablet Take 81 mg by mouth in the morning.     Cholecalciferol (VITAMIN D3) 125 MCG (5000 UT) CAPS Take 5,000 Units by mouth daily with breakfast.     citalopram (CELEXA) 10 MG tablet Take 1 tablet by mouth once daily 90 tablet 3   Coenzyme Q10 (COQ10) 200 MG CAPS Take 200 mg by mouth in the morning.     Famotidine (PEPCID PO) Take 10 mg by mouth every morning.     HYDROcodone-acetaminophen (NORCO/VICODIN) 5-325 MG tablet Take 1 tablet by mouth every 4 (four) hours as needed. (Patient not taking: Reported on 09/27/2023) 15 tablet 0   levothyroxine (SYNTHROID) 175 MCG tablet Take 0.5-1 tablets (87.5-175 mcg total) by mouth See admin instructions. Take 1 tablet daily every morning except half tablet on Sundays (Patient taking differently: Take 175 mcg by mouth See admin instructions. Take 1 tablet daily every morning except half tablet on Sundays) 90 tablet 2   melatonin 5 MG TABS Take 5 mg by mouth at bedtime as needed (for sleep).     mexiletine (MEXITIL) 150 MG capsule Take 1 capsule by mouth twice daily 60 capsule 3   midodrine (PROAMATINE) 5 MG tablet TAKE 1 TABLET BY MOUTH TWICE DAILY AS NEEDED. TAKE 5 MG IF DOPPLER IS 90 OR LESS (Patient taking differently: Take 5 mg by mouth daily.) 60 tablet 6   pantoprazole (PROTONIX) 40 MG tablet Take 1 tablet by mouth once daily 90 tablet 3   predniSONE (DELTASONE) 20 MG tablet Take 2 tablets (40 mg total) by mouth daily with breakfast for 5 days. 10 tablet 0   Probiotic Product (PROBIOTIC PO) Take 1 capsule by mouth in the morning.     rosuvastatin (CRESTOR) 20 MG tablet Take 1 tablet by mouth once daily 90 tablet 3   sotalol (BETAPACE) 80 MG tablet Take 1 tablet (80 mg total) by mouth daily. 90 tablet 3   traZODone (DESYREL) 50 MG tablet Take 1 tablet (50 mg total) by mouth at bedtime. 30 tablet 6   warfarin (COUMADIN) 5 MG tablet TAKE 1 TABLET BY MOUTH ONCE DAILY AS DIRECTED BY HF CLINIC (Patient taking differently: Take 5 mg by mouth. Takes 1 tablet  Sun, Tues, Wed, Thurs., and 1/2 tab on mon. and Friday only) 90 tablet 6   No current facility-administered medications for this encounter.   Facility-Administered Medications Ordered in Other Encounters  Medication Dose Route Frequency Provider Last Rate Last Admin   etomidate (AMIDATE) injection    Anesthesia Intra-op Lucinda Dell, CRNA   20 mg at 02/08/14 0915   rocuronium [facility]) injection    Anesthesia Intra-op Lucinda Dell, CRNA   50 mg at 02/08/14 0932   succinylcholine (ANECTINE) injection    Anesthesia Intra-op Lucinda Dell, CRNA   100 mg at 02/08/14 0915   Ace  inhibitors, Amiodarone, Diovan [valsartan], Lipitor [atorvastatin calcium], Norvasc [amlodipine], and Jardiance [empagliflozin]  Review of systems complete and found to be negative unless listed in HPI.     Wt Readings from Last 3 Encounters:  08/02/23 75.8 kg (167 lb 1.7 oz)  07/26/23 75.8 kg (167 lb 3.2 oz)  05/22/23 73 kg (161 lb)     Vital Signs:   Doppler Pressure: 118 Automatic BP: 121/103 (109) HR: 90 SPO2: 98% on RA   Weight: 165 lb w/ eqt Last weight: 164.6 lb w/eqt    Physical Exam: General:  NAD.  HEENT: normal  Neck: supple. JVP 7.  Carotids 2+ bilat; no bruits. No lymphadenopathy or thryomegaly appreciated. Cor: LVAD hum.  Lungs: Clear. Abdomen: soft, nontender, non-distended. No hepatosplenomegaly. No bruits or masses. Good bowel sounds. Driveline site clean. Anchor in place.  Extremities: no cyanosis, clubbing, rash. Warm no edema   left 4th finger swollen/red/tender Neuro: alert & oriented x 3. No focal deficits. Moves all 4 without problem    ASSESSMENT AND PLAN:   1) Chronic systolic HF: ICM, EF 20-25% s/p LVAD HM II implant 08/2013 for DT.   - Echo 3/18 EF 10-15% mild AI. Mod RV dysfunction. LVAD cannula OK - has struggled with RV failure however RHC 11/22 looked good (despite evidence of RVF on LE dopplers showing reflux - Doing well NYHA I-II. Improved with  midodrine - Volume status looks good  -Failed Jardiance due to volume depletion - He has not tolerated ACE-I or amlodipine or b-blocker in the past.  2) LVAD: HMII 08/2013 for DT. - VAD interrogated personally. Parameters stable.Continues to have DL faults. These were reviewed personally by me and VAD Coordinator and discussed with [employer] tech team. F/u as below -  in 9/24 had DL faults: VAD log files d/w industry. It appears there is a potential degrading conductor/wire connection in Phase A (same phase as previously noted on [id number] JUNE 24).  There was no interruption in pump support during these events.   - Remains on ASA and warfarin (HM-2) - LDH 208 - INR 3.0 Goal 2.0-2.5 Discussed warfarin dosing with PharmD personally. 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 Company rep contacted and log files sent. Controller changed but did not fix problem  - More DL faults now but asymptomatic. Again reviewed with [employer] team. Continue to follow closely   4) HTN complicated by orthostatic hypotension - Much improved with close BP monitoring and midodrine but still a bit elevated.  - no change  5) VT/Frequent PVCs - s/p ICD gen change.  - Off amio due to cerebellar side effects.  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 - VT  06/16/21 with  DC-CV x 1 - Recurrent VT 12/28/21 after stopping sotalol. Sotalol restarted. - PVCs improved on mexilitene.  Continue mexilitene - Keep K > 4.0 Mg > 2.0 (K 4.5 mg 2.2 today) - No recent VT - Continue sotalol and mexilitene. Continue to follow with EP - Watch sotalol dosing with CKD  5) Atrial flutter, paroxysmal - patient paced out of AFL in 6/22. Atrial therapies enabled  - continue sotalol (on for VT). QTs slightly prolonged but stable - No recent AFL. In NSR today  6) CAD - no s/s angina - continue Crestor  9) CKD IV -  Creatinine baseline 1.8-2.4 - Scr today 2.29 - failed Jardiance -  Follows with Dr. Signe Colt - Need to follow closely. Increase fluid intake  10) Acute gout - Back on prednisone. Pending appt with Rheum this week  - I d/w Dr. Marcos Eke   I spent a total of 45 minutes today: 1) reviewing the patient's medical records including previous charts, labs and recent notes from other providers; 2) examining the patient and counseling them on their medical issues/explaining the plan of care; 3) adjusting meds as needed and 4) ordering lab work or other needed tests.    Arvilla Meres, MD  9:52 AM

## 2023-11-22 NOTE — Progress Notes (Signed)
 Patient presents for 2 month follow up in VAD Clinic today alone. Reports no problems with VAD equipment or concerns with drive line.   Pt ambulated into clinic unassisted. Denies lightheadedness, falls, shortness of breath, and signs of bleeding. Reports dizziness when he bends over. This resolves with position change, and is not new for him. Pt endorses good appetite and hydration. He remains active without limitation.   He reports he is taking 5 mg of Midodrine daily in the morning and has it PRN for the afternoon but he has not taken any afternoon doses recently. His orthostatic symptoms have greatly improved since starting this. No changes today per Dr Gala Romney.   Pt is following with [facility]. Pt states that the team there told him to stop drinking dark colored drinks and soda. Pt has follow up with their team. He is currently not taking any diuretics.  Pt has had multiple gout flares in the last month treated with Prednisone. Referral sent to Rheumatology. Pt to see Dr.Beekman tomorrow.  [address] [employer] noted and ongoing. Advised by [employer] Clinical rep and Engineer that since patient is asymptomatic to send log files at all follow up appointments to trend red primary lead. Log files sent to [employer] today. Pt states he has not had any alarms on his controller and remains asymptomatic. Pt instructed again to call VAD Coordinator if any alarming occurs. Unable to obtain log files today. Will obtain next visit.  Vital Signs:   Doppler Pressure: 118 Automatic BP: 121/103 (109) HR: 90 SPO2: 98% on RA   Weight: 165 lb w/ eqt Last weight: 164.6 lb w/eqt  VAD Indication: Destination therapy- patient choice     VAD interrogation & Equipment Management: Speed: 9800 Flow: 4.5 Power: 5.9 w    PI: 5.3   Alarms: Drive line Fault Events: 16-10  Fixed speed: 9800  Low speed limit: 9200   Primary Controller:  Replace back up battery in 12 months Back up controller:  Replace back up  battery in 28 months   Annual Equipment Maintenance on UBC/PM was performed 09/22/22   I reviewed the LVAD parameters from today and compared the results to the patient's prior recorded data. LVAD interrogation was NEGATIVE for significant power changes, NEGATIVE for clinical alarms and POSITIVE for PI events/speed drops. No programming changes were made and pump is functioning within specified parameters. Pt is performing daily controller and system monitor self tests along with completing weekly and monthly maintenance for LVAD equipment.  Exit Site Care: Drive line is being maintained weekly by [name], his wife. Existing VAD dressing C/D/I. Pt given 8 weekly kits for at home use.  Significant Events on VAD Support:  10/2013>SBO 12/2016> elevated LDH, admit for Bival   Device: Dual Medtronic Therapies:  on 231  Pacing: DDD 80 bpm  Last check:  12/24/2022  BP & Labs:  Doppler 118 - Doppler is reflecting modified systolic  Hgb 12.2 - No S/S of bleeding. Specifically denies melena/BRBPR or nosebleeds.   LDH 208- established baseline of 200- 350. Denies tea-colored urine. No power elevations noted on interrogation.   Patient Instructions:  No medication changes Coumadin dosing per Lauren PharmD  Simmie Davies RN,BSN VAD Coordinator  Office: [phone]  24/7 Pager: [phone]

## 2023-11-23 ENCOUNTER — Other Ambulatory Visit (HOSPITAL_COMMUNITY): Payer: Self-pay

## 2023-11-23 DIAGNOSIS — Z95811 Presence of heart assist device: Secondary | ICD-10-CM

## 2023-11-23 DIAGNOSIS — Z7901 Long term (current) use of anticoagulants: Secondary | ICD-10-CM

## 2023-11-25 ENCOUNTER — Encounter (HOSPITAL_COMMUNITY): Payer: Self-pay | Admitting: Unknown Physician Specialty

## 2023-11-25 ENCOUNTER — Ambulatory Visit (HOSPITAL_COMMUNITY)
Admission: RE | Admit: 2023-11-25 | Discharge: 2023-11-25 | Disposition: A | Discharge status: Home / Self Care | Source: Ambulatory Visit | Attending: Cardiology | Admitting: Cardiology

## 2023-11-25 DIAGNOSIS — Z95811 Presence of heart assist device: Secondary | ICD-10-CM | POA: Diagnosis present

## 2023-11-25 DIAGNOSIS — Z7901 Long term (current) use of anticoagulants: Secondary | ICD-10-CM | POA: Diagnosis not present

## 2023-11-25 LAB — URIC ACID: Uric Acid, Serum: 9.4 mg/dL — ABNORMAL HIGH (ref 3.7–8.6)

## 2023-11-25 MED ORDER — PREDNISONE 20 MG PO TABS: 20.0000 mg | ORAL_TABLET | Freq: Every day | ORAL | 0 refills | Status: AC

## 2023-11-27 ENCOUNTER — Other Ambulatory Visit (HOSPITAL_COMMUNITY): Payer: Self-pay | Admitting: Internal Medicine

## 2023-11-27 DIAGNOSIS — Z95811 Presence of heart assist device: Secondary | ICD-10-CM

## 2023-11-27 DIAGNOSIS — K219 Gastro-esophageal reflux disease without esophagitis: Secondary | ICD-10-CM

## 2023-11-29 ENCOUNTER — Other Ambulatory Visit (HOSPITAL_COMMUNITY): Payer: Self-pay | Admitting: Internal Medicine

## 2023-11-29 ENCOUNTER — Encounter (HOSPITAL_COMMUNITY): Admitting: Internal Medicine

## 2023-11-29 DIAGNOSIS — I493 Ventricular premature depolarization: Secondary | ICD-10-CM

## 2023-11-29 DIAGNOSIS — I472 Ventricular tachycardia, unspecified: Secondary | ICD-10-CM

## 2023-11-29 DIAGNOSIS — Z95811 Presence of heart assist device: Secondary | ICD-10-CM

## 2023-11-29 LAB — CYCLIC CITRUL PEPTIDE ANTIBODY, IGG/IGA: CCP Antibodies IgG/IgA: 8 U (ref 0–19)

## 2023-12-10 ENCOUNTER — Other Ambulatory Visit (HOSPITAL_COMMUNITY): Payer: Self-pay | Admitting: Unknown Physician Specialty

## 2023-12-10 DIAGNOSIS — Z7901 Long term (current) use of anticoagulants: Secondary | ICD-10-CM

## 2023-12-10 DIAGNOSIS — Z95811 Presence of heart assist device: Secondary | ICD-10-CM

## 2023-12-15 ENCOUNTER — Ambulatory Visit (HOSPITAL_COMMUNITY)
Admission: RE | Admit: 2023-12-15 | Discharge: 2023-12-15 | Disposition: A | Discharge status: Home / Self Care | Source: Ambulatory Visit | Attending: Cardiology | Admitting: Cardiology

## 2023-12-15 ENCOUNTER — Ambulatory Visit (HOSPITAL_COMMUNITY): Payer: Self-pay | Admitting: Pharmacist

## 2023-12-15 DIAGNOSIS — Z95811 Presence of heart assist device: Secondary | ICD-10-CM | POA: Diagnosis present

## 2023-12-15 DIAGNOSIS — Z7901 Long term (current) use of anticoagulants: Secondary | ICD-10-CM | POA: Diagnosis not present

## 2023-12-15 LAB — PROTIME-INR
INR: 3 — ABNORMAL HIGH (ref 0.8–1.2)
Prothrombin Time: 31.4 s — ABNORMAL HIGH (ref 11.4–15.2)

## 2023-12-23 ENCOUNTER — Ambulatory Visit (INDEPENDENT_AMBULATORY_CARE_PROVIDER_SITE_OTHER): Payer: Medicare Other

## 2023-12-23 DIAGNOSIS — I255 Ischemic cardiomyopathy: Secondary | ICD-10-CM

## 2023-12-23 LAB — CUP PACEART REMOTE DEVICE CHECK
Battery Remaining Longevity: 22 mo
Battery Voltage: 2.93 V
Brady Statistic AP VP Percent: 91.82 %
Brady Statistic AP VS Percent: 0.34 %
Brady Statistic AS VP Percent: 7.82 %
Brady Statistic AS VS Percent: 0.03 %
Brady Statistic RA Percent Paced: 91.99 %
Brady Statistic RV Percent Paced: 99.57 %
Date Time Interrogation Session: 20250424012303
HighPow Impedance: 44 Ohm
HighPow Impedance: 56 Ohm
Implantable Lead Connection Status: 753985
Implantable Lead Connection Status: 753985
Implantable Lead Implant Date: 20091103
Implantable Lead Implant Date: 20091103
Implantable Lead Location: 753859
Implantable Lead Location: 753860
Implantable Lead Model: 5076
Implantable Lead Model: 6947
Implantable Pulse Generator Implant Date: 20201028
Lead Channel Impedance Value: 342 Ohm
Lead Channel Impedance Value: 418 Ohm
Lead Channel Impedance Value: 418 Ohm
Lead Channel Pacing Threshold Amplitude: 0.5 V
Lead Channel Pacing Threshold Amplitude: 1.25 V
Lead Channel Pacing Threshold Pulse Width: 0.4 ms
Lead Channel Pacing Threshold Pulse Width: 0.4 ms
Lead Channel Sensing Intrinsic Amplitude: 1.875 mV
Lead Channel Sensing Intrinsic Amplitude: 1.875 mV
Lead Channel Sensing Intrinsic Amplitude: 5 mV
Lead Channel Sensing Intrinsic Amplitude: 5 mV
Lead Channel Setting Pacing Amplitude: 1.5 V
Lead Channel Setting Pacing Amplitude: 2.75 V
Lead Channel Setting Pacing Pulse Width: 0.6 ms
Lead Channel Setting Sensing Sensitivity: 0.3 mV

## 2023-12-25 ENCOUNTER — Other Ambulatory Visit (HOSPITAL_COMMUNITY): Payer: Self-pay | Admitting: Internal Medicine

## 2023-12-25 DIAGNOSIS — Z95811 Presence of heart assist device: Secondary | ICD-10-CM

## 2023-12-25 DIAGNOSIS — I493 Ventricular premature depolarization: Secondary | ICD-10-CM

## 2023-12-25 DIAGNOSIS — I472 Ventricular tachycardia, unspecified: Secondary | ICD-10-CM

## 2023-12-31 ENCOUNTER — Other Ambulatory Visit (HOSPITAL_COMMUNITY): Payer: Self-pay | Admitting: Unknown Physician Specialty

## 2023-12-31 DIAGNOSIS — Z95811 Presence of heart assist device: Secondary | ICD-10-CM

## 2023-12-31 DIAGNOSIS — Z7901 Long term (current) use of anticoagulants: Secondary | ICD-10-CM

## 2024-01-05 ENCOUNTER — Ambulatory Visit (HOSPITAL_COMMUNITY)
Admission: RE | Admit: 2024-01-05 | Discharge: 2024-01-05 | Disposition: A | Discharge status: Home / Self Care | Source: Ambulatory Visit | Attending: Internal Medicine | Admitting: Internal Medicine

## 2024-01-05 ENCOUNTER — Ambulatory Visit (HOSPITAL_COMMUNITY): Payer: Self-pay | Admitting: Pharmacist

## 2024-01-05 DIAGNOSIS — Z7901 Long term (current) use of anticoagulants: Secondary | ICD-10-CM | POA: Diagnosis present

## 2024-01-05 DIAGNOSIS — Z95811 Presence of heart assist device: Secondary | ICD-10-CM | POA: Diagnosis present

## 2024-01-05 LAB — PROTIME-INR
INR: 3.7 — ABNORMAL HIGH (ref 0.8–1.2)
Prothrombin Time: 36.6 s — ABNORMAL HIGH (ref 11.4–15.2)

## 2024-01-06 ENCOUNTER — Other Ambulatory Visit: Payer: Self-pay

## 2024-01-21 ENCOUNTER — Other Ambulatory Visit (HOSPITAL_COMMUNITY): Payer: Self-pay | Admitting: *Deleted

## 2024-01-21 ENCOUNTER — Other Ambulatory Visit (HOSPITAL_COMMUNITY): Payer: Self-pay | Admitting: Internal Medicine

## 2024-01-21 DIAGNOSIS — I5022 Chronic systolic (congestive) heart failure: Secondary | ICD-10-CM

## 2024-01-21 DIAGNOSIS — Z95811 Presence of heart assist device: Secondary | ICD-10-CM

## 2024-01-21 DIAGNOSIS — I472 Ventricular tachycardia, unspecified: Secondary | ICD-10-CM

## 2024-01-21 DIAGNOSIS — Z7901 Long term (current) use of anticoagulants: Secondary | ICD-10-CM

## 2024-01-21 DIAGNOSIS — I493 Ventricular premature depolarization: Secondary | ICD-10-CM

## 2024-01-25 ENCOUNTER — Ambulatory Visit (HOSPITAL_COMMUNITY)
Admission: RE | Admit: 2024-01-25 | Discharge: 2024-01-25 | Disposition: A | Discharge status: Home / Self Care | Source: Ambulatory Visit | Attending: Internal Medicine | Admitting: Internal Medicine

## 2024-01-25 ENCOUNTER — Ambulatory Visit (HOSPITAL_COMMUNITY): Payer: Self-pay | Admitting: Pharmacist

## 2024-01-25 VITALS — BP 123/106 | HR 81 | Wt 166.0 lb

## 2024-01-25 DIAGNOSIS — M1A0410 Idiopathic chronic gout, right hand, without tophus (tophi): Secondary | ICD-10-CM

## 2024-01-25 DIAGNOSIS — M109 Gout, unspecified: Secondary | ICD-10-CM | POA: Diagnosis not present

## 2024-01-25 DIAGNOSIS — N184 Chronic kidney disease, stage 4 (severe): Secondary | ICD-10-CM

## 2024-01-25 DIAGNOSIS — Z7901 Long term (current) use of anticoagulants: Secondary | ICD-10-CM | POA: Diagnosis not present

## 2024-01-25 DIAGNOSIS — Z79899 Other long term (current) drug therapy: Secondary | ICD-10-CM | POA: Diagnosis not present

## 2024-01-25 DIAGNOSIS — I5022 Chronic systolic (congestive) heart failure: Secondary | ICD-10-CM | POA: Diagnosis not present

## 2024-01-25 DIAGNOSIS — Z4509 Encounter for adjustment and management of other cardiac device: Secondary | ICD-10-CM | POA: Diagnosis present

## 2024-01-25 DIAGNOSIS — Z95811 Presence of heart assist device: Secondary | ICD-10-CM | POA: Diagnosis not present

## 2024-01-25 DIAGNOSIS — I2510 Atherosclerotic heart disease of native coronary artery without angina pectoris: Secondary | ICD-10-CM

## 2024-01-25 DIAGNOSIS — I472 Ventricular tachycardia, unspecified: Secondary | ICD-10-CM

## 2024-01-25 DIAGNOSIS — I480 Paroxysmal atrial fibrillation: Secondary | ICD-10-CM

## 2024-01-25 LAB — BASIC METABOLIC PANEL WITH GFR
Anion gap: 10 (ref 5–15)
BUN: 31 mg/dL — ABNORMAL HIGH (ref 8–23)
CO2: 23 mmol/L (ref 22–32)
Calcium: 9.3 mg/dL (ref 8.9–10.3)
Chloride: 105 mmol/L (ref 98–111)
Creatinine, Ser: 2.25 mg/dL — ABNORMAL HIGH (ref 0.61–1.24)
GFR, Estimated: 29 mL/min — ABNORMAL LOW (ref >60)
Glucose, Bld: 134 mg/dL — ABNORMAL HIGH (ref 70–99)
Potassium: 4.7 mmol/L (ref 3.5–5.1)
Sodium: 138 mmol/L (ref 135–145)

## 2024-01-25 LAB — CBC
HCT: 37.8 % — ABNORMAL LOW (ref 39.0–52.0)
Hemoglobin: 12.4 g/dL — ABNORMAL LOW (ref 13.0–17.0)
MCH: 29.5 pg (ref 26.0–34.0)
MCHC: 32.8 g/dL (ref 30.0–36.0)
MCV: 89.8 fL (ref 80.0–100.0)
Platelets: 203 10*3/uL (ref 150–400)
RBC: 4.21 MIL/uL — ABNORMAL LOW (ref 4.22–5.81)
RDW: 16.6 % — ABNORMAL HIGH (ref 11.5–15.5)
WBC: 7.1 10*3/uL (ref 4.0–10.5)
nRBC: 0 % (ref 0.0–0.2)

## 2024-01-25 LAB — PROTIME-INR
INR: 3 — ABNORMAL HIGH (ref 0.8–1.2)
Prothrombin Time: 31 s — ABNORMAL HIGH (ref 11.4–15.2)

## 2024-01-25 LAB — MAGNESIUM: Magnesium: 2 mg/dL (ref 1.7–2.4)

## 2024-01-25 LAB — LACTATE DEHYDROGENASE: LDH: 233 U/L — ABNORMAL HIGH (ref 98–192)

## 2024-01-25 NOTE — Patient Instructions (Signed)
No medication changes ?Coumadin dosing per Ander Purpura PharmD ?Return to clinic in 2 months  ?

## 2024-01-25 NOTE — Progress Notes (Signed)
 Patient presents for 2 month follow up in VAD Clinic today with his wife Marily Shows. Reports no problems with VAD equipment or concerns with drive line.   Pt ambulated into clinic unassisted. Denies lightheadedness, falls, shortness of breath, and signs of bleeding. Reports dizziness when he bends over. This resolves with position change, and is not new for him.  Pt endorses good appetite and hydration. He remains active without limitation.   He reports he is taking 5 mg of Midodrine  daily in the morning and has it PRN for the afternoon. He does state he has had some dizziness on and off and tells me that he had 1 day he had to take a midodrine  in the afternoon. His orthostatic symptoms have greatly improved since starting this.   Pt is following with [facility]. Pt states that the team there told him to stop drinking dark colored drinks and soda. Pt has follow up with their team. He is currently not taking any diuretics.  Pt has had multiple gout flares in the last month treated with Prednisone . Referral sent to Rheumatology. Pt was started on allopurinol and tells me this has really helped his symptoms.  [address] [employer] noted and ongoing. Advised by [employer] Clinical rep and Engineer that since patient is asymptomatic to send log files at all follow up appointments to trend red primary lead. Log files sent to [employer] today. Pt states he has not had any alarms on his controller and remains asymptomatic. Pt instructed again to call VAD Coordinator if any alarming occurs.   Per [employer]: The log continued to capture intermittent driveline fault alarms. The current phase data can be viewed in the below chart.  Phase Data Average (5/22-5/27/2025) Phase A Marrie Sizer)  Phase B [place] [employer]) Chip Coulter [facility])  [phone] [id number] 77.025   There were no other unusual events recorded in the log file event history. The MCS equipment is operating as intended. Pump parameter trends can be viewed in the graphs  below.   Patient ID [id number]:[id number]  Controller SW 7.29  Date range 01/20/2024 15:40 01/25/2024 13:55  Fixed Speed 9800  Low Speed Limit 9200  Actual Speed Avg 9781  Power max 6.9  Power min 4.9  Power avg 5.8  Flow max 6.3  Flow min 3.2  Flow avg 4.3  PI max 7.2  PI min 1.7  PI avg 4.8  PI event total 143    Vital Signs:   Doppler Pressure: 118 Automatic BP: 123/106 (113) HR: 81 SPO2: UTO   Weight: 166 lb w/ eqt Last weight: 165 lb w/eqt  VAD Indication: Destination therapy- patient choice     VAD interrogation & Equipment Management: Speed: 9800 Flow: 4.7 Power: 6 w    PI: 6.7   Alarms: Drive line Fault Events: 65-78  Fixed speed: 9800  Low speed limit: 9200   Primary Controller:  Replace back up battery in 8 months Back up controller:  Replace back up battery in 26 months   Annual Equipment Maintenance on UBC/PM was performed 09/22/22   I reviewed the LVAD parameters from today and compared the results to the patient's prior recorded data. LVAD interrogation was NEGATIVE for significant power changes, NEGATIVE for clinical alarms and POSITIVE for PI events/speed drops. No programming changes were made and pump is functioning within specified parameters. Pt is performing daily controller and system monitor self tests along with completing weekly and monthly maintenance for LVAD equipment.  Exit Site Care: Drive line is being maintained weekly by  Marily Shows, his wife. Existing VAD dressing C/D/I. Pt given 8 weekly kits for at home use.  Significant Events on VAD Support:  10/2013>SBO 12/2016> elevated LDH, admit for Bival   Device: Dual Medtronic Therapies:  on 231  Pacing: DDD 80 bpm  Last check:  12/24/2022  BP & Labs:  Doppler 118 - Doppler is reflecting modified systolic  Hgb 12.4 - No S/S of bleeding. Specifically denies melena/BRBPR or nosebleeds.   LDH 233 - established baseline of 200- 350. Denies tea-colored urine. No power elevations noted  on interrogation.   Patient Instructions:  No medication changes Coumadin  dosing per Lauren PharmD Return to clinic in 2 months  Adams Adams RN,BSN VAD Coordinator  Office: [phone]  24/7 Pager: [phone]

## 2024-02-01 NOTE — Progress Notes (Signed)
 Remote ICD transmission.

## 2024-02-03 NOTE — Progress Notes (Signed)
 VAD CLINIC NOTE    HPI:  Johnny Oneill is a 83 y.o. 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   Presented to ER on 06/27/19 with syncopal episode and head injury from fall. ECG showed VT @ 240. Underwent emergent DC-CV in ER. Head CT no bleed. Admitted and had ICD gen replacement (EOL). -> Did not tolerate amio due to tremors.  Had ICD shock for VT on 06/09/20. Started on sotalol   On 02/06/21 Paced out of AFL.  Seen in ER 05/2021 due to LOW FLOW alarms. Found to be in VT rate 170. DCCV x 1 - returned to SR with frequent PVCs. Pacer settings adjusted by Michaelle Adolphus PA- DDD 90.  RHC 11/22 to assess for RV failure RA = 5 RV = 33/5 PA = 33/13 (20) PCW = 10 Fick cardiac output/index = 4.0/2.1 Thermo CO/CI = 4.9/2.6 PVR = 2.0 WU Ao sat = 95% PA sat = 54%, 59% PAPI = 4.0  Admitted 12/01/21 for R TKA. Did well with surgery but complicated by severe orthostatic hypotension. Midodrine  added    Admitted 12/28/21 with recurrent VT. Had DC-CV in ER. Sotalol  restarted. Continued to have elevated MAPs as well as orthostasis. Midodrine  decreased and florinef  added.   Admitted 02/09/22 with  a/c RV failure w/ cardiogenic shock. LFTs, INR, creatinine elevated on admit. Started on empiric milrinone  with gradual improvement. LFTs/INR/creatinine improved. Had RHC primary left sided failure. LVAD speed optimized with speed increased to 9800.  Milrinone  stopped without difficulty. INR followed closely by pharmacy with adjustments to coumadin .   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 Seen by Rheum and placed on allopurinol with good control.   Here for f/u. Doing very well. Remains active. Reffing for Volleyball. Denies SOB, CP, orthopnea or PND. No fevers, chills or problems with driveline. No bleeding, melena or neuro symptoms. Taking midodrine  daily and occasionally bid for orthostasis. Continue to  get DL Fault alerts. Taking all meds as prescribed.    VAD Indication: Destination therapy- patient choice     VAD interrogation & Equipment Management: Speed: 9800 Flow: 4.7 Power: 6.0 w    PI: 66.7   Alarms: Drive line Fault Events: 16-10  Fixed speed: 9800  Low speed limit: 9200   Primary Controller:  Replace back up battery in 8 months Back up controller:  Replace back up battery in 26 months   Annual Equipment Maintenance on UBC/PM was performed 09/22/22   I reviewed the LVAD parameters from today and compared the results to the patient's prior recorded data. LVAD interrogation was NEGATIVE for significant power changes, NEGATIVE for clinical alarms and POSITIVE for PI events/speed drops. No programming changes were made and pump is functioning within specified parameters. Pt is performing daily controller and system monitor self tests along with completing weekly and monthly maintenance for LVAD equipment.   Past Medical History:  Diagnosis Date   Anxiety    Automatic implantable cardioverter-defibrillator in situ    CAD (coronary artery disease)    S/P stenting of LCX in 2004;  s/p Lat MI 2009 with occlusion of the LCX - treated with Promus stenting. Has total occlusion of the RCA.    CHF (congestive heart failure) (HCC)    Hypercholesteremia    Hypertension    Hypothyroidism    ICD (implantable cardiac defibrillator) in place    PROPHYLACTIC  medtronic   Inferior MI (HCC) "? date"; 2009   Ischemic cardiomyopathy    a. 10/09 Echo: Sev LV Dysfxn, inf/lat AK, Mod MR.   Liver disease    LVAD (left ventricular assist device) present (HCC)    Osteoarthritis    a. s/p R TKR   Pacemaker    PVC (premature ventricular contraction)    RBBB    Shortness of breath     Current Outpatient Medications  Medication Sig Dispense Refill   acetaminophen  (TYLENOL ) 500 MG tablet Take 1,000 mg by mouth every 6 (six) hours as needed (for pain or headaches).     allopurinol  (ZYLOPRIM) 100 MG tablet Take 50 mg by mouth daily.     aspirin  EC 81 MG tablet Take 81 mg by mouth in the morning.     Cholecalciferol (VITAMIN D3) 125 MCG (5000 UT) CAPS Take 5,000 Units by mouth daily with breakfast.     citalopram  (CELEXA ) 10 MG tablet Take 1 tablet by mouth once daily 90 tablet 3   Coenzyme Q10 (COQ10) 200 MG CAPS Take 200 mg by mouth in the morning.     Famotidine  (PEPCID  PO) Take 10 mg by mouth every morning.     levothyroxine  (SYNTHROID ) 175 MCG tablet Take 0.5-1 tablets (87.5-175 mcg total) by mouth See admin instructions. Take 1 tablet daily every morning except half tablet on Sundays (Patient taking differently: Take 175 mcg by mouth See admin instructions. Take 1 tablet daily every morning except half tablet on Sundays) 90 tablet 2   midodrine  (PROAMATINE ) 5 MG tablet TAKE 1 TABLET BY MOUTH TWICE DAILY AS NEEDED. TAKE 5 MG IF DOPPLER IS 90 OR LESS (Patient taking differently: Take 5 mg by mouth daily.) 60 tablet 6   pantoprazole  (PROTONIX ) 40 MG tablet Take 1 tablet by mouth once daily 90 tablet 0   Probiotic Product (PROBIOTIC PO) Take 1 capsule by mouth in the morning.     rosuvastatin  (CRESTOR ) 20 MG tablet Take 1 tablet by mouth once daily 90 tablet 3   sotalol  (BETAPACE ) 80 MG tablet Take 1 tablet (80 mg total) by mouth daily. 90 tablet 3   traZODone  (DESYREL ) 50 MG tablet Take 1 tablet (50 mg total) by mouth at bedtime. 30 tablet 6   warfarin (COUMADIN ) 5 MG tablet TAKE 1 TABLET BY MOUTH ONCE DAILY AS DIRECTED BY HF CLINIC (Patient taking differently: Take 5 mg by mouth. Takes 1 tablet Sun, Tues, Wed, Thurs., and 1/2 tab on mon. and Friday only) 90 tablet 6   HYDROcodone -acetaminophen  (NORCO/VICODIN) 5-325 MG tablet Take 1 tablet by mouth every 4 (four) hours as needed. (Patient not taking: Reported on 01/25/2024) 15 tablet 0   melatonin 5 MG TABS Take 5 mg by mouth at bedtime as needed (for sleep). (Patient not taking: Reported on 01/25/2024)     mexiletine (MEXITIL )  150 MG capsule Take 1 capsule by mouth twice daily 60 capsule 11   No current facility-administered medications for this encounter.   Facility-Administered Medications Ordered in Other Encounters  Medication Dose Route Frequency Provider Last Rate Last Admin   etomidate  (AMIDATE ) injection    Anesthesia Intra-op Decarlo, Valerie M, CRNA   20 mg at 02/08/14 0915   rocuronium  (ZEMURON ) injection    Anesthesia Intra-op Decarlo, Valerie M, CRNA   50 mg at 02/08/14 0932   succinylcholine  (ANECTINE ) injection    Anesthesia Intra-op Decarlo, Valerie M, CRNA   100 mg at 02/08/14 0915   Ace inhibitors, Amiodarone , Diovan [  valsartan], Lipitor [atorvastatin  calcium ], Norvasc  [amlodipine ], and Jardiance  [empagliflozin ]  Review of systems complete and found to be negative unless listed in HPI.     Wt Readings from Last 3 Encounters:  01/25/24 75.3 kg (166 lb)  11/22/23 74.8 kg (165 lb)  08/02/23 75.8 kg (167 lb 1.7 oz)   Vitals:   01/25/24 1426 01/25/24 1427  BP: (!) 118/0 (!) 123/106  Pulse:  81  Weight:  75.3 kg (166 lb)     Vital Signs:   Doppler Pressure: 118 Automatic BP: 123/106 (113) HR: 81 SPO2: UTO   Weight: 166 lb w/ eqt Last weight: 165 lb w/eqt  Physical Exam: General:  NAD.  HEENT: normal  Neck: supple. JVP 6-7.  Carotids 2+ bilat; no bruits. No lymphadenopathy or thryomegaly appreciated. Cor: LVAD hum.  Lungs: Clear. Abdomen: soft, nontender, non-distended. No hepatosplenomegaly. No bruits or masses. Good bowel sounds. Driveline site clean. Anchor in place.  Extremities: no cyanosis, clubbing, rash. Warm no edema  Neuro: alert & oriented x 3. No focal deficits. Moves all 4 without problem   ASSESSMENT AND PLAN:   1) Chronic systolic HF: ICM, EF 20-25% s/p LVAD HM II implant 08/2013 for DT.   - Echo 3/18 EF 10-15% mild AI. Mod RV dysfunction. LVAD cannula OK - has struggled with RV failure however RHC 11/22 looked good (despite evidence of RVF on LE dopplers showing  reflux - Doing well. NYHA I-II. Orthostasis managed with midodrine  - Volume status ok    -Failed Jardiance  due to volume depletion - He has not tolerated ACE-I or amlodipine  or b-blockers   2) LVAD: HMII 08/2013 for DT. - VAD interrogated personally. Parameters stable..Continues to have DL fault alarms. These were reviewed personally by me and VAD Coordinator and discussed with [employer] tech team again today -> Advised by [employer] Clinical rep and Engineer that since patient is asymptomatic to send log files at all follow up appointments to trend red primary lead. Log files sent to [employer] today.  - Remains on ASA and warfarin (HM-2) - LDH 233 - INR 3.0 Goal 2.0-2.5 Discussed warfarin dosing with PharmD personally. - DL site ok - MAPs remain elevated but hasn't tolerating lowering in setting of orthostasis  3) Driveline fault alerts - see discussion above   4) HTN complicated by orthostatic hypotension - - MAPs remain elevated but hasn't tolerating lowering in setting of orthostasis  5) VT/Frequent PVCs - s/p ICD gen change.  - Off amio due to cerebellar side effects.  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 - VT  06/16/21 with  DC-CV x 1 - Recurrent VT 12/28/21 after stopping sotalol . Sotalol  restarted. - PVCs improved on mexilitene.  Continue mexilitene - Keep K > 4.0 Mg > 2.0 (K 4.5 mg 2.2 today) - No recent VT - Continue sotalol  and mexilitene. Continue to follow with EP - Watch sotalol  dosing with CKD. SCr stable today  5) Atrial flutter, paroxysmal - patient paced out of AFL in 6/22. Atrial therapies enabled  - continue sotalol  (on for VT). QTs slightly prolonged but have been stable - No recent AFL - In NSR today. On warfarin   6) CAD - no s/s angina - continue Crestor   9) CKD IV - Creatinine baseline 1.8-2.4 - Scr today stable at 2.25 - failed Jardiance  - Follows with Dr. Christianne Cowper (Renal)  10) Acute gout - Following with Rheum - Improved on allopurinol   I spent a total of 53 minutes  today: 1) reviewing the patient's medical records including  previous charts, labs and recent notes from other providers; 2) examining the patient and counseling them on their medical issues/explaining the plan of care; 3) adjusting meds as needed and 4) ordering lab work or other needed tests.    Jules Oar, MD  1:00 PM

## 2024-02-11 ENCOUNTER — Other Ambulatory Visit (HOSPITAL_COMMUNITY): Payer: Self-pay | Admitting: *Deleted

## 2024-02-11 DIAGNOSIS — Z95811 Presence of heart assist device: Secondary | ICD-10-CM

## 2024-02-11 DIAGNOSIS — Z7901 Long term (current) use of anticoagulants: Secondary | ICD-10-CM

## 2024-02-15 ENCOUNTER — Ambulatory Visit (HOSPITAL_COMMUNITY)
Admission: RE | Admit: 2024-02-15 | Discharge: 2024-02-15 | Disposition: A | Discharge status: Home / Self Care | Source: Ambulatory Visit | Attending: Cardiology | Admitting: Cardiology

## 2024-02-15 ENCOUNTER — Ambulatory Visit (HOSPITAL_COMMUNITY): Payer: Self-pay | Admitting: Pharmacist

## 2024-02-15 DIAGNOSIS — Z7901 Long term (current) use of anticoagulants: Secondary | ICD-10-CM | POA: Insufficient documentation

## 2024-02-15 DIAGNOSIS — Z95811 Presence of heart assist device: Secondary | ICD-10-CM | POA: Insufficient documentation

## 2024-02-15 LAB — PROTIME-INR
INR: 2.9 — ABNORMAL HIGH (ref 0.8–1.2)
Prothrombin Time: 30.5 s — ABNORMAL HIGH (ref 11.4–15.2)

## 2024-02-16 ENCOUNTER — Other Ambulatory Visit (HOSPITAL_COMMUNITY)

## 2024-02-23 ENCOUNTER — Other Ambulatory Visit (HOSPITAL_COMMUNITY): Payer: Self-pay | Admitting: Internal Medicine

## 2024-02-23 DIAGNOSIS — K219 Gastro-esophageal reflux disease without esophagitis: Secondary | ICD-10-CM

## 2024-02-23 DIAGNOSIS — Z95811 Presence of heart assist device: Secondary | ICD-10-CM

## 2024-03-02 ENCOUNTER — Other Ambulatory Visit (HOSPITAL_COMMUNITY): Payer: Self-pay | Admitting: Unknown Physician Specialty

## 2024-03-02 DIAGNOSIS — Z7901 Long term (current) use of anticoagulants: Secondary | ICD-10-CM

## 2024-03-02 DIAGNOSIS — Z95811 Presence of heart assist device: Secondary | ICD-10-CM

## 2024-03-08 ENCOUNTER — Ambulatory Visit (HOSPITAL_COMMUNITY): Payer: Self-pay | Admitting: Pharmacist

## 2024-03-08 ENCOUNTER — Ambulatory Visit (HOSPITAL_COMMUNITY)
Admission: RE | Admit: 2024-03-08 | Discharge: 2024-03-08 | Disposition: A | Discharge status: Home / Self Care | Source: Ambulatory Visit | Attending: Cardiology | Admitting: Cardiology

## 2024-03-08 DIAGNOSIS — Z7901 Long term (current) use of anticoagulants: Secondary | ICD-10-CM | POA: Insufficient documentation

## 2024-03-08 DIAGNOSIS — Z4509 Encounter for adjustment and management of other cardiac device: Secondary | ICD-10-CM | POA: Diagnosis present

## 2024-03-08 DIAGNOSIS — Z95811 Presence of heart assist device: Secondary | ICD-10-CM | POA: Insufficient documentation

## 2024-03-08 LAB — PROTIME-INR
INR: 2.7 — ABNORMAL HIGH (ref 0.8–1.2)
Prothrombin Time: 30.2 s — ABNORMAL HIGH (ref 11.4–15.2)

## 2024-03-08 LAB — URIC ACID: Uric Acid, Serum: 8 mg/dL (ref 3.7–8.6)

## 2024-03-09 LAB — RHEUMATOID FACTOR: Rheumatoid fact SerPl-aCnc: <10 [IU]/mL (ref <14.0)

## 2024-03-15 ENCOUNTER — Other Ambulatory Visit (HOSPITAL_COMMUNITY): Payer: Self-pay | Admitting: Internal Medicine

## 2024-03-15 DIAGNOSIS — Z7901 Long term (current) use of anticoagulants: Secondary | ICD-10-CM

## 2024-03-15 DIAGNOSIS — I472 Ventricular tachycardia, unspecified: Secondary | ICD-10-CM

## 2024-03-15 DIAGNOSIS — Z95811 Presence of heart assist device: Secondary | ICD-10-CM

## 2024-03-23 ENCOUNTER — Ambulatory Visit

## 2024-03-23 DIAGNOSIS — I255 Ischemic cardiomyopathy: Secondary | ICD-10-CM

## 2024-03-24 LAB — CUP PACEART REMOTE DEVICE CHECK
Battery Remaining Longevity: 24 mo
Battery Voltage: 2.92 V
Brady Statistic AP VP Percent: 91.39 %
Brady Statistic AP VS Percent: 0.2 %
Brady Statistic AS VP Percent: 8.36 %
Brady Statistic AS VS Percent: 0.05 %
Brady Statistic RA Percent Paced: 91.46 %
Brady Statistic RV Percent Paced: 99.69 %
Date Time Interrogation Session: 20250724052826
HighPow Impedance: 40 Ohm
HighPow Impedance: 50 Ohm
Implantable Lead Connection Status: 753985
Implantable Lead Connection Status: 753985
Implantable Lead Implant Date: 20091103
Implantable Lead Implant Date: 20091103
Implantable Lead Location: 753859
Implantable Lead Location: 753860
Implantable Lead Model: 5076
Implantable Lead Model: 6947
Implantable Pulse Generator Implant Date: 20201028
Lead Channel Impedance Value: 342 Ohm
Lead Channel Impedance Value: 399 Ohm
Lead Channel Impedance Value: 399 Ohm
Lead Channel Pacing Threshold Amplitude: 0.5 V
Lead Channel Pacing Threshold Amplitude: 1.25 V
Lead Channel Pacing Threshold Pulse Width: 0.4 ms
Lead Channel Pacing Threshold Pulse Width: 0.4 ms
Lead Channel Sensing Intrinsic Amplitude: 2.25 mV
Lead Channel Sensing Intrinsic Amplitude: 2.25 mV
Lead Channel Sensing Intrinsic Amplitude: 7.25 mV
Lead Channel Sensing Intrinsic Amplitude: 7.25 mV
Lead Channel Setting Pacing Amplitude: 1.5 V
Lead Channel Setting Pacing Amplitude: 2.75 V
Lead Channel Setting Pacing Pulse Width: 0.6 ms
Lead Channel Setting Sensing Sensitivity: 0.3 mV

## 2024-03-27 ENCOUNTER — Ambulatory Visit: Payer: Self-pay | Admitting: Cardiology

## 2024-03-27 ENCOUNTER — Other Ambulatory Visit (HOSPITAL_COMMUNITY): Payer: Self-pay | Admitting: *Deleted

## 2024-03-27 DIAGNOSIS — Z7901 Long term (current) use of anticoagulants: Secondary | ICD-10-CM

## 2024-03-27 DIAGNOSIS — I5022 Chronic systolic (congestive) heart failure: Secondary | ICD-10-CM

## 2024-03-27 DIAGNOSIS — Z95811 Presence of heart assist device: Secondary | ICD-10-CM

## 2024-03-27 NOTE — Addendum Note (Signed)
 Addended by: ELZA DOMINO B on: 03/27/2024 03:58 PM   Modules accepted: Orders

## 2024-03-28 ENCOUNTER — Ambulatory Visit (HOSPITAL_COMMUNITY)
Admission: RE | Admit: 2024-03-28 | Discharge: 2024-03-28 | Disposition: A | Discharge status: Home / Self Care | Source: Ambulatory Visit | Attending: Internal Medicine | Admitting: Internal Medicine

## 2024-03-28 ENCOUNTER — Ambulatory Visit (HOSPITAL_COMMUNITY): Payer: Self-pay | Admitting: Pharmacist

## 2024-03-28 VITALS — BP 110/97 | HR 82 | Wt 165.6 lb

## 2024-03-28 DIAGNOSIS — I2510 Atherosclerotic heart disease of native coronary artery without angina pectoris: Secondary | ICD-10-CM

## 2024-03-28 DIAGNOSIS — Z95811 Presence of heart assist device: Secondary | ICD-10-CM | POA: Insufficient documentation

## 2024-03-28 DIAGNOSIS — M1A0410 Idiopathic chronic gout, right hand, without tophus (tophi): Secondary | ICD-10-CM

## 2024-03-28 DIAGNOSIS — I472 Ventricular tachycardia, unspecified: Secondary | ICD-10-CM

## 2024-03-28 DIAGNOSIS — Z7901 Long term (current) use of anticoagulants: Secondary | ICD-10-CM | POA: Insufficient documentation

## 2024-03-28 DIAGNOSIS — I5022 Chronic systolic (congestive) heart failure: Secondary | ICD-10-CM | POA: Diagnosis present

## 2024-03-28 DIAGNOSIS — N184 Chronic kidney disease, stage 4 (severe): Secondary | ICD-10-CM

## 2024-03-28 DIAGNOSIS — Z4509 Encounter for adjustment and management of other cardiac device: Secondary | ICD-10-CM | POA: Diagnosis not present

## 2024-03-28 LAB — COMPREHENSIVE METABOLIC PANEL WITH GFR
ALT: 14 U/L (ref 0–44)
AST: 25 U/L (ref 15–41)
Albumin: 3.8 g/dL (ref 3.5–5.0)
Alkaline Phosphatase: 96 U/L (ref 38–126)
Anion gap: 8 (ref 5–15)
BUN: 33 mg/dL — ABNORMAL HIGH (ref 8–23)
CO2: 24 mmol/L (ref 22–32)
Calcium: 9.1 mg/dL (ref 8.9–10.3)
Chloride: 106 mmol/L (ref 98–111)
Creatinine, Ser: 2.31 mg/dL — ABNORMAL HIGH (ref 0.61–1.24)
GFR, Estimated: 28 mL/min — ABNORMAL LOW (ref >60)
Glucose, Bld: 135 mg/dL — ABNORMAL HIGH (ref 70–99)
Potassium: 4.6 mmol/L (ref 3.5–5.1)
Sodium: 138 mmol/L (ref 135–145)
Total Bilirubin: 0.7 mg/dL (ref 0.0–1.2)
Total Protein: 6.8 g/dL (ref 6.5–8.1)

## 2024-03-28 LAB — PROTIME-INR
INR: 2.8 — ABNORMAL HIGH (ref 0.8–1.2)
Prothrombin Time: 31.2 s — ABNORMAL HIGH (ref 11.4–15.2)

## 2024-03-28 LAB — MAGNESIUM: Magnesium: 2.1 mg/dL (ref 1.7–2.4)

## 2024-03-28 LAB — CBC
HCT: 37 % — ABNORMAL LOW (ref 39.0–52.0)
Hemoglobin: 12 g/dL — ABNORMAL LOW (ref 13.0–17.0)
MCH: 29.8 pg (ref 26.0–34.0)
MCHC: 32.4 g/dL (ref 30.0–36.0)
MCV: 91.8 fL (ref 80.0–100.0)
Platelets: 170 K/uL (ref 150–400)
RBC: 4.03 MIL/uL — ABNORMAL LOW (ref 4.22–5.81)
RDW: 15.2 % (ref 11.5–15.5)
WBC: 5.5 K/uL (ref 4.0–10.5)
nRBC: 0 % (ref 0.0–0.2)

## 2024-03-28 LAB — PREALBUMIN: Prealbumin: 23 mg/dL (ref 18–38)

## 2024-03-28 LAB — T4, FREE: Free T4: 1.06 ng/dL (ref 0.61–1.12)

## 2024-03-28 LAB — LACTATE DEHYDROGENASE: LDH: 281 U/L — ABNORMAL HIGH (ref 98–192)

## 2024-03-28 LAB — URIC ACID: Uric Acid, Serum: 7.2 mg/dL (ref 3.7–8.6)

## 2024-03-28 LAB — TSH: TSH: 0.057 u[IU]/mL — ABNORMAL LOW (ref 0.350–4.500)

## 2024-03-28 NOTE — Patient Instructions (Signed)
No medication changes Coumadin dosing per Lauren PharmD Return to clinic in 2 months for follow up with Dr Bensimhon.    

## 2024-03-28 NOTE — Progress Notes (Signed)
 Patient presents for 2 month follow up with 10.5 yr Intermacs in VAD Clinic today alone. Reports no problems with VAD equipment or concerns with drive line.   Pt ambulated into clinic unassisted. Denies lightheadedness, falls, shortness of breath, and signs of bleeding. Reports dizziness when he bends over. This resolves with position change, and is not new for him.  Pt endorses good appetite. Admits he is not drinking as much as he should everyday. He remains active without limitation.   He reports he is taking 5 mg of Midodrine  daily in the morning and has it PRN for the afternoon. His orthostatic symptoms have greatly improved since starting this.   Pt is following with [facility]. Pt states that the team there told him to stop drinking dark colored drinks and soda. Pt has follow up with their team. He is currently not taking any diuretics.  Pt has had multiple gout flares in the last few months. He is followed by [facility]. Since starting Allopurinol his symptoms have greatly improved. Uric acid collected today. Routed to Rheumatology in Epic.   [address] [employer] noted and ongoing. Advised by [employer] Clinical rep and Engineer that since patient is asymptomatic to send log files at all follow up appointments to trend red primary lead. Log files sent to [employer] today. Pt states he has not had any alarms on his controller and remains asymptomatic. Pt instructed again to call VAD Coordinator if any alarming occurs.   Per [employer]: The log noted a few intermittent driveline fault alarms. There were no other unusual events recorded in the log file event history.   The MCS equipment is operating as intended.  Pump parameter trends can be viewed in the graphs below.  Patient ID [id number]  Controller SW 7.29  Date range 03/23/2024 13:49 03/28/2024 10:05  Fixed Speed 9800  Low Speed Limit 9200  Actual Speed Avg 9779  Power max 7  Power min 4.8  Power avg 5.9  Flow max 6.5   Flow min 3  Flow avg 4.6  PI max 6.8  PI min 2.3  PI avg 4.5  PI event total 210    Vital Signs:   Doppler Pressure: 130 Automatic BP: 110/97 (103) HR: 82 SPO2: UTO   Weight: 165.6 lb w/ eqt Last weight: 166 lb w/eqt  VAD Indication: Destination therapy- patient choice     VAD interrogation & Equipment Management: Speed: 9800 Flow: 4.5 Power: 5.9 w    PI: 6.5   Alarms: occasional Drive line Fault Events: 39+ daily  Fixed speed: 9800  Low speed limit: 9200   Primary Controller: Expired. Replaced with [id number]. Manufacture: 12/13/23 Expiration: 11/11/25. Replace back up battery in 33 months Back up controller:  Replace back up battery in 26 months   Annual Equipment Maintenance on UBC/PM was performed 09/27/23.   I reviewed the LVAD parameters from today and compared the results to the patient's prior recorded data. LVAD interrogation was NEGATIVE for significant power changes, NEGATIVE for clinical alarms and POSITIVE for PI events/speed drops. No programming changes were made and pump is functioning within specified parameters. Pt is performing daily controller and system monitor self tests along with completing weekly and monthly maintenance for LVAD equipment.  Exit Site Care: Drive line is being maintained weekly by Dagoberto, his wife. Existing VAD dressing C/D/I. Pt given 8 weekly kits for at home use.  Significant Events on VAD Support:  10/2013>SBO 12/2016> elevated LDH, admit for [place]   Device: Dual Medtronic  Therapies:  on 231  Pacing: DDD 80 bpm  Last check:  03/24/2023  BP & Labs:  Doppler 130 - Doppler is reflecting modified systolic  Hgb 12.0 - No S/S of bleeding. Specifically denies melena/BRBPR or nosebleeds.   LDH 281 - established baseline of 200- 350. Denies tea-colored urine. No power elevations noted on interrogation.   10.5 year Intermacs follow up completed including:  Quality of Life, KCCQ-12, and Neurocognitive trail making.   Pt completed  1400 feet during 6 minute walk.  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   [place] Cardiomyopathy Questionnaire     03/28/2024    1:13 PM 09/27/2023    3:10 PM 05/10/2023   12:07 PM  KCCQ-12  1 a. Ability to shower/bathe Slightly limited Not at all limited Not at all limited  1 b. Ability to walk 1 block Not at all limited Not at all limited Not at all limited  1 c. Ability to hurry/jog Slightly limited Moderately limited Slightly limited  2. Edema feet/ankles/legs Less than once a week Never over the past 2 weeks Never over the past 2 weeks  3. Limited by fatigue Less than once a week Less than once a week Less than once a week  4. Limited by dyspnea 1-2 times a week Less than once a week Less than once a week  5. Sitting up / on 3+ pillows Never over the past 2 weeks Never over the past 2 weeks Never over the past 2 weeks  6. Limited enjoyment of life Slightly limited Not limited at all Not limited at all  7. Rest of life w/ symptoms Completely satisfied Completely satisfied Completely satisfied  8 a. Participation in hobbies Slightly limited Slightly limited Slightly limited  8 b. Participation in chores Slightly limited Did not limit at all Did not limit at all  8 c. Visiting family/friends Did not limit at all Did not limit at all Did not limit at all     Patient Instructions:  No medication changes Coumadin  dosing per Lauren PharmD Return to clinic in 2 months for follow up with Dr Cherrie  Isaiah Knoll RN VAD Coordinator  Office: [phone]  24/7 Pager: [phone]

## 2024-03-29 LAB — T3, FREE: T3, Free: 2.7 pg/mL (ref 2.0–4.4)

## 2024-04-02 NOTE — Progress Notes (Signed)
 VAD CLINIC NOTE    HPI:  Johnny Oneill is a 83 y.o. 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   Presented to ER on 06/27/19 with syncopal episode and head injury from fall. ECG showed VT @ 240. Underwent emergent DC-CV in ER. Head CT no bleed. Admitted and had ICD gen replacement (EOL). -> Did not tolerate amio due to tremors.  Had ICD shock for VT on 06/09/20. Started on sotalol   On 02/06/21 Paced out of AFL.  Seen in ER 05/2021 due to LOW FLOW alarms. Found to be in VT rate 170. DCCV x 1 - returned to SR with frequent PVCs. Pacer settings adjusted by Jodie Passey PA- DDD 90.  RHC 11/22 to assess for RV failure RA = 5 RV = 33/5 PA = 33/13 (20) PCW = 10 Fick cardiac output/index = 4.0/2.1 Thermo CO/CI = 4.9/2.6 PVR = 2.0 WU Ao sat = 95% PA sat = 54%, 59% PAPI = 4.0  Admitted 12/01/21 for R TKA. Did well with surgery but complicated by severe orthostatic hypotension. Midodrine  added    Admitted 12/28/21 with recurrent VT. Had DC-CV in ER. Sotalol  restarted. Continued to have elevated MAPs as well as orthostasis. Midodrine  decreased and florinef  added.   Admitted 02/09/22 with  a/c RV failure w/ cardiogenic shock. LFTs, INR, creatinine elevated on admit. Started on empiric milrinone  with gradual improvement. LFTs/INR/creatinine improved. Had RHC primary left sided failure. LVAD speed optimized with speed increased to 9800.  Milrinone  stopped without difficulty. INR followed closely by pharmacy with adjustments to coumadin .   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 Seen by Rheum and placed on allopurinol with good control.   Here for f/u. Doing very well. Remains active. Denies CP or SOB. Continues to take mexilitene daily with no further hypotensive episodes. Gout now under control. Denies orthopnea or PND. No fevers, chills or problems with driveline. No bleeding, melena or neuro  symptoms. No VAD alarms. Taking all meds as prescribed.     VAD Indication: Destination therapy- patient choice     VAD interrogation & Equipment Management: Speed: 9800 Flow: 4.5 Power: 5.9 w    PI: 6.5   Alarms: occasional Drive line Fault Events: 39+ daily  Fixed speed: 9800  Low speed limit: 9200   Primary Controller: Expired. Replaced with [id number]. Manufacture: 12/13/23 Expiration: 11/11/25. Replace back up battery in 33 months Back up controller:  Replace back up battery in 26 months   Annual Equipment Maintenance on UBC/PM was performed 09/27/23.   I reviewed the LVAD parameters from today and compared the results to the patient's prior recorded data. LVAD interrogation was NEGATIVE for significant power changes, NEGATIVE for clinical alarms and POSITIVE for PI events/speed drops. No programming changes were made and pump is functioning within specified parameters. Pt is performing daily controller and system monitor self tests along with completing weekly and monthly maintenance for LVAD equipment.   Past Medical History:  Diagnosis Date   Anxiety    Automatic implantable cardioverter-defibrillator in situ    CAD (coronary artery disease)    S/P stenting of LCX in 2004;  s/p Lat MI 2009 with occlusion of the LCX - treated with Promus stenting. Has total occlusion of the RCA.    CHF (congestive heart failure) (HCC)    Hypercholesteremia    Hypertension    Hypothyroidism    ICD (implantable cardiac  defibrillator) in place    PROPHYLACTIC      medtronic   Inferior MI [facility]) ? date; 2009   Ischemic cardiomyopathy    a. 10/09 Echo: Sev LV Dysfxn, inf/lat AK, Mod MR.   Liver disease    LVAD (left ventricular assist device) present (HCC)    Osteoarthritis    a. s/p R TKR   Pacemaker    PVC (premature ventricular contraction)    RBBB    Shortness of breath     Current Outpatient Medications  Medication Sig Dispense Refill   acetaminophen  (TYLENOL ) 500 MG tablet  Take 1,000 mg by mouth every 6 (six) hours as needed (for pain or headaches).     allopurinol (ZYLOPRIM) 100 MG tablet Take 50 mg by mouth daily. (Patient taking differently: Take 100 mg by mouth daily.)     aspirin  EC 81 MG tablet Take 81 mg by mouth in the morning.     Cholecalciferol (VITAMIN D3) 125 MCG (5000 UT) CAPS Take 5,000 Units by mouth daily with breakfast.     citalopram  (CELEXA ) 10 MG tablet Take 1 tablet by mouth once daily 90 tablet 3   Coenzyme Q10 (COQ10) 200 MG CAPS Take 200 mg by mouth in the morning.     Famotidine  (PEPCID  PO) Take 10 mg by mouth every morning.     levothyroxine  (SYNTHROID ) 175 MCG tablet Take 0.5-1 tablets (87.5-175 mcg total) by mouth See admin instructions. Take 1 tablet daily every morning except half tablet on Sundays 90 tablet 2   mexiletine (MEXITIL ) 150 MG capsule Take 1 capsule by mouth twice daily 60 capsule 11   midodrine  (PROAMATINE ) 5 MG tablet TAKE 1 TABLET BY MOUTH TWICE DAILY AS NEEDED. TAKE 5 MG IF DOPPLER IS 90 OR LESS 60 tablet 6   pantoprazole  (PROTONIX ) 40 MG tablet Take 1 tablet by mouth once daily 90 tablet 0   Probiotic Product (PROBIOTIC PO) Take 1 capsule by mouth in the morning.     rosuvastatin  (CRESTOR ) 20 MG tablet Take 1 tablet by mouth once daily 90 tablet 3   sotalol  (BETAPACE ) 80 MG tablet Take 1 tablet (80 mg total) by mouth daily. 90 tablet 3   traZODone  (DESYREL ) 50 MG tablet Take 1 tablet (50 mg total) by mouth at bedtime. 30 tablet 6   warfarin (COUMADIN ) 5 MG tablet Take 2.5 mg (1/2 tablet) every Mon, Fri; 5 mg (whole tablet) all other days; or as directed by Heart Failure Clinic 90 tablet 3   HYDROcodone -acetaminophen  (NORCO/VICODIN) 5-325 MG tablet Take 1 tablet by mouth every 4 (four) hours as needed. (Patient not taking: Reported on 03/28/2024) 15 tablet 0   melatonin 5 MG TABS Take 5 mg by mouth at bedtime as needed (for sleep). (Patient not taking: Reported on 03/28/2024)     No current facility-administered  medications for this encounter.   Facility-Administered Medications Ordered in Other Encounters  Medication Dose Route Frequency Provider Last Rate Last Admin   etomidate  (AMIDATE ) injection    Anesthesia Intra-op Decarlo, Valerie M, CRNA   20 mg at 02/08/14 0915   rocuronium  (ZEMURON ) injection    Anesthesia Intra-op Decarlo, Valerie M, CRNA   50 mg at 02/08/14 0932   succinylcholine  (ANECTINE ) injection    Anesthesia Intra-op Decarlo, Valerie M, CRNA   100 mg at 02/08/14 0915   Ace inhibitors, Amiodarone , Diovan [valsartan], Lipitor [atorvastatin  calcium ], Norvasc  [amlodipine ], and Jardiance  [empagliflozin ]  Review of systems complete and found to be negative unless listed in HPI.  Wt Readings from Last 3 Encounters:  03/28/24 75.1 kg (165 lb 9.6 oz)  01/25/24 75.3 kg (166 lb)  11/22/23 74.8 kg (165 lb)   Vitals:   03/28/24 1005 03/28/24 1010  BP: (!) 130/0 (!) 110/97  Pulse: 82   Weight: 75.1 kg (165 lb 9.6 oz)     Vital Signs:   Doppler Pressure: 130 Automatic BP: 1110/97 (103) HR: 82 SPO2: UTO   Weight: 165.6 lb w/ eqt Last weight: 166 lb w/eqt  Physical Exam: General:  NAD.  HEENT: normal  Neck: supple. JVP not elevated.  Carotids 2+ bilat; no bruits. No lymphadenopathy or thryomegaly appreciated. Cor: LVAD hum.  Lungs: Clear. Abdomen: soft, nontender, non-distended. No hepatosplenomegaly. No bruits or masses. Good bowel sounds. Driveline site clean. Anchor in place.  Extremities: no cyanosis, clubbing, rash. Warm no edema  Neuro: alert & oriented x 3. No focal deficits. Moves all 4 without problem    ASSESSMENT AND PLAN:   1) Chronic systolic HF: ICM, EF 20-25% s/p LVAD HM II implant 08/2013 for DT.   - Echo 3/18 EF 10-15% mild AI. Mod RV dysfunction. LVAD cannula OK - has struggled with RV failure however RHC 11/22 looked good (despite evidence of RVF on LE dopplers showing reflux - Doing well. NYHA I-II. Orthostasis managed with daily midodrine  - Volume  status ok  -Failed Jardiance  due to volume depletion - He has not tolerated ACE-I or amlodipine  or b-blockers   2) LVAD: HMII 08/2013 for DT. - VAD interrogated personally. Parameters stable.Continues to have DL fault alarms. These were reviewed personally by me and VAD Coordinator and discussed with [employer] tech team again today -> Advised by [employer] Clinical rep and Engineer that since patient is asymptomatic to send log files at all follow up appointments to [facility] primary lead. Log files sent to [employer] today. Stable by report - Remains on ASA and warfarin (HM-2) - LDH 281 - INR 2.8 Goal 2.0-2.5 Discussed warfarin dosing with PharmD personally. - DL site ok - MAPs remain elevated  but hasn't tolerating lowering in setting of orthostasis No change  3) Driveline fault alerts - see discussion above. Reports sent to [employer] today and are stable   4) HTN complicated by orthostatic hypotension - MAPs remain elevated  but hasn't tolerating lowering in setting of orthostasis No change  5) VT/Frequent PVCs - s/p ICD gen change.  - Off amio due to cerebellar side effects.  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 - VT  06/16/21 with  DC-CV x 1 - Recurrent VT 12/28/21 after stopping sotalol . Sotalol  restarted. - PVCs improved on mexilitene.  Continue mexilitene - Keep K > 4.0 Mg > 2.0 (K 4.5 mg 2.2 today) - No recent VT - Continue sotalol  and mexilitene. Continue to follow with EP. - Watch sotalol  dosing with CKD. Scr stable today at 2.3  6) Atrial flutter, paroxysmal - patient paced out of AFL in 6/22. Atrial therapies enabled  - continue sotalol  (on for VT). QTs slightly prolonged but have been stable - No recent AFL - In NSR today on warafarin  7) CAD - no s/s angina - continue Crestor   9) CKD IV - Creatinine baseline 1.8-2.4 - Scr today stable at 2.25 -> 2.31 - failed Jardiance  - Follows with Dr. Gearline (Renal)  10) Chronic gout - Following with Rheum - Improved on allopurinol   I spent a  total of 39 minutes today: 1) reviewing the patient's medical records including previous charts, labs and recent notes from  other providers; 2) examining the patient and counseling them on their medical issues/explaining the plan of care; 3) adjusting meds as needed and 4) ordering lab work or other needed tests.    Toribio Fuel, MD  3:46 PM

## 2024-04-14 ENCOUNTER — Other Ambulatory Visit (HOSPITAL_COMMUNITY): Payer: Self-pay | Admitting: Unknown Physician Specialty

## 2024-04-14 DIAGNOSIS — Z95811 Presence of heart assist device: Secondary | ICD-10-CM

## 2024-04-14 DIAGNOSIS — Z7901 Long term (current) use of anticoagulants: Secondary | ICD-10-CM

## 2024-04-19 ENCOUNTER — Ambulatory Visit (HOSPITAL_COMMUNITY)
Admission: RE | Admit: 2024-04-19 | Discharge: 2024-04-19 | Disposition: A | Discharge status: Home / Self Care | Source: Ambulatory Visit | Attending: Cardiology | Admitting: Cardiology

## 2024-04-19 ENCOUNTER — Ambulatory Visit (HOSPITAL_COMMUNITY): Payer: Self-pay | Admitting: Pharmacist

## 2024-04-19 DIAGNOSIS — Z95811 Presence of heart assist device: Secondary | ICD-10-CM | POA: Insufficient documentation

## 2024-04-19 DIAGNOSIS — Z7901 Long term (current) use of anticoagulants: Secondary | ICD-10-CM | POA: Diagnosis not present

## 2024-04-19 LAB — PROTIME-INR
INR: 2.3 — ABNORMAL HIGH (ref 0.8–1.2)
Prothrombin Time: 26.8 s — ABNORMAL HIGH (ref 11.4–15.2)

## 2024-04-27 ENCOUNTER — Other Ambulatory Visit (HOSPITAL_COMMUNITY): Payer: Self-pay | Admitting: Internal Medicine

## 2024-04-27 DIAGNOSIS — I5022 Chronic systolic (congestive) heart failure: Secondary | ICD-10-CM

## 2024-04-27 DIAGNOSIS — Z95811 Presence of heart assist device: Secondary | ICD-10-CM

## 2024-05-05 ENCOUNTER — Other Ambulatory Visit (HOSPITAL_COMMUNITY): Payer: Self-pay | Admitting: *Deleted

## 2024-05-05 DIAGNOSIS — Z95811 Presence of heart assist device: Secondary | ICD-10-CM

## 2024-05-05 DIAGNOSIS — Z7901 Long term (current) use of anticoagulants: Secondary | ICD-10-CM

## 2024-05-10 ENCOUNTER — Ambulatory Visit (HOSPITAL_COMMUNITY): Payer: Self-pay | Admitting: Pharmacist

## 2024-05-10 ENCOUNTER — Ambulatory Visit (HOSPITAL_COMMUNITY)
Admission: RE | Admit: 2024-05-10 | Discharge: 2024-05-10 | Disposition: A | Discharge status: Home / Self Care | Source: Ambulatory Visit | Attending: Cardiology | Admitting: Cardiology

## 2024-05-10 DIAGNOSIS — Z95811 Presence of heart assist device: Secondary | ICD-10-CM | POA: Diagnosis not present

## 2024-05-10 DIAGNOSIS — Z4509 Encounter for adjustment and management of other cardiac device: Secondary | ICD-10-CM | POA: Insufficient documentation

## 2024-05-10 DIAGNOSIS — Z7901 Long term (current) use of anticoagulants: Secondary | ICD-10-CM | POA: Insufficient documentation

## 2024-05-10 LAB — PROTIME-INR
INR: 2.4 — ABNORMAL HIGH (ref 0.8–1.2)
Prothrombin Time: 27.6 s — ABNORMAL HIGH (ref 11.4–15.2)

## 2024-05-11 ENCOUNTER — Other Ambulatory Visit (HOSPITAL_COMMUNITY): Payer: Self-pay

## 2024-05-11 DIAGNOSIS — Z7901 Long term (current) use of anticoagulants: Secondary | ICD-10-CM

## 2024-05-11 DIAGNOSIS — Z95811 Presence of heart assist device: Secondary | ICD-10-CM

## 2024-05-31 ENCOUNTER — Ambulatory Visit (HOSPITAL_COMMUNITY): Payer: Self-pay | Admitting: Pharmacist

## 2024-05-31 ENCOUNTER — Ambulatory Visit (HOSPITAL_COMMUNITY)
Admission: RE | Admit: 2024-05-31 | Discharge: 2024-05-31 | Disposition: A | Discharge status: Home / Self Care | Source: Ambulatory Visit | Attending: Internal Medicine | Admitting: Internal Medicine

## 2024-05-31 DIAGNOSIS — Z7901 Long term (current) use of anticoagulants: Secondary | ICD-10-CM | POA: Diagnosis present

## 2024-05-31 DIAGNOSIS — Z95811 Presence of heart assist device: Secondary | ICD-10-CM | POA: Insufficient documentation

## 2024-05-31 LAB — PROTIME-INR
INR: 2.7 — ABNORMAL HIGH (ref 0.8–1.2)
Prothrombin Time: 29.5 s — ABNORMAL HIGH (ref 11.4–15.2)

## 2024-06-01 ENCOUNTER — Other Ambulatory Visit (HOSPITAL_COMMUNITY): Payer: Self-pay | Admitting: Internal Medicine

## 2024-06-01 DIAGNOSIS — K219 Gastro-esophageal reflux disease without esophagitis: Secondary | ICD-10-CM

## 2024-06-01 DIAGNOSIS — Z95811 Presence of heart assist device: Secondary | ICD-10-CM

## 2024-06-01 NOTE — Progress Notes (Signed)
 Remote ICD Transmission

## 2024-06-07 ENCOUNTER — Other Ambulatory Visit (HOSPITAL_COMMUNITY): Payer: Self-pay | Admitting: Internal Medicine

## 2024-06-07 DIAGNOSIS — I5043 Acute on chronic combined systolic (congestive) and diastolic (congestive) heart failure: Secondary | ICD-10-CM

## 2024-06-07 DIAGNOSIS — E785 Hyperlipidemia, unspecified: Secondary | ICD-10-CM

## 2024-06-09 ENCOUNTER — Other Ambulatory Visit (HOSPITAL_COMMUNITY): Payer: Self-pay | Admitting: Unknown Physician Specialty

## 2024-06-09 DIAGNOSIS — Z7901 Long term (current) use of anticoagulants: Secondary | ICD-10-CM

## 2024-06-09 DIAGNOSIS — Z95811 Presence of heart assist device: Secondary | ICD-10-CM

## 2024-06-12 ENCOUNTER — Ambulatory Visit (HOSPITAL_COMMUNITY)
Admission: RE | Admit: 2024-06-12 | Discharge: 2024-06-12 | Disposition: A | Source: Ambulatory Visit | Attending: Internal Medicine

## 2024-06-12 ENCOUNTER — Ambulatory Visit (HOSPITAL_COMMUNITY): Payer: Self-pay | Admitting: Pharmacist

## 2024-06-12 ENCOUNTER — Ambulatory Visit (HOSPITAL_COMMUNITY)
Admission: RE | Admit: 2024-06-12 | Discharge: 2024-06-12 | Disposition: A | Discharge status: Home / Self Care | Source: Ambulatory Visit | Attending: Internal Medicine | Admitting: Internal Medicine

## 2024-06-12 VITALS — BP 130/0 | HR 86 | Wt 168.2 lb

## 2024-06-12 DIAGNOSIS — R7989 Other specified abnormal findings of blood chemistry: Secondary | ICD-10-CM | POA: Diagnosis not present

## 2024-06-12 DIAGNOSIS — I5022 Chronic systolic (congestive) heart failure: Secondary | ICD-10-CM

## 2024-06-12 DIAGNOSIS — Z95810 Presence of automatic (implantable) cardiac defibrillator: Secondary | ICD-10-CM | POA: Diagnosis not present

## 2024-06-12 DIAGNOSIS — Z7901 Long term (current) use of anticoagulants: Secondary | ICD-10-CM

## 2024-06-12 DIAGNOSIS — I493 Ventricular premature depolarization: Secondary | ICD-10-CM | POA: Diagnosis not present

## 2024-06-12 DIAGNOSIS — I4892 Unspecified atrial flutter: Secondary | ICD-10-CM | POA: Insufficient documentation

## 2024-06-12 DIAGNOSIS — N184 Chronic kidney disease, stage 4 (severe): Secondary | ICD-10-CM

## 2024-06-12 DIAGNOSIS — Z95811 Presence of heart assist device: Secondary | ICD-10-CM

## 2024-06-12 DIAGNOSIS — I2510 Atherosclerotic heart disease of native coronary artery without angina pectoris: Secondary | ICD-10-CM | POA: Insufficient documentation

## 2024-06-12 DIAGNOSIS — M1A9XX0 Chronic gout, unspecified, without tophus (tophi): Secondary | ICD-10-CM | POA: Insufficient documentation

## 2024-06-12 DIAGNOSIS — R42 Dizziness and giddiness: Secondary | ICD-10-CM

## 2024-06-12 DIAGNOSIS — I130 Hypertensive heart and chronic kidney disease with heart failure and stage 1 through stage 4 chronic kidney disease, or unspecified chronic kidney disease: Secondary | ICD-10-CM | POA: Insufficient documentation

## 2024-06-12 DIAGNOSIS — I429 Cardiomyopathy, unspecified: Secondary | ICD-10-CM | POA: Insufficient documentation

## 2024-06-12 DIAGNOSIS — Z7982 Long term (current) use of aspirin: Secondary | ICD-10-CM | POA: Diagnosis not present

## 2024-06-12 DIAGNOSIS — Z79899 Other long term (current) drug therapy: Secondary | ICD-10-CM | POA: Diagnosis not present

## 2024-06-12 DIAGNOSIS — R55 Syncope and collapse: Secondary | ICD-10-CM

## 2024-06-12 DIAGNOSIS — I472 Ventricular tachycardia, unspecified: Secondary | ICD-10-CM

## 2024-06-12 LAB — CBC
HCT: 38.7 % — ABNORMAL LOW (ref 39.0–52.0)
Hemoglobin: 12.6 g/dL — ABNORMAL LOW (ref 13.0–17.0)
MCH: 29.6 pg (ref 26.0–34.0)
MCHC: 32.6 g/dL (ref 30.0–36.0)
MCV: 90.8 fL (ref 80.0–100.0)
Platelets: 150 K/uL (ref 150–400)
RBC: 4.26 MIL/uL (ref 4.22–5.81)
RDW: 16.9 % — ABNORMAL HIGH (ref 11.5–15.5)
WBC: 5.4 K/uL (ref 4.0–10.5)
nRBC: 0 % (ref 0.0–0.2)

## 2024-06-12 LAB — BASIC METABOLIC PANEL WITH GFR
Anion gap: 7 (ref 5–15)
BUN: 32 mg/dL — ABNORMAL HIGH (ref 8–23)
CO2: 22 mmol/L (ref 22–32)
Calcium: 8.9 mg/dL (ref 8.9–10.3)
Chloride: 108 mmol/L (ref 98–111)
Creatinine, Ser: 2.2 mg/dL — ABNORMAL HIGH (ref 0.61–1.24)
GFR, Estimated: 30 mL/min — ABNORMAL LOW (ref >60)
Glucose, Bld: 162 mg/dL — ABNORMAL HIGH (ref 70–99)
Potassium: 4.3 mmol/L (ref 3.5–5.1)
Sodium: 137 mmol/L (ref 135–145)

## 2024-06-12 LAB — PROTIME-INR
INR: 3.2 — ABNORMAL HIGH (ref 0.8–1.2)
Prothrombin Time: 34.2 s — ABNORMAL HIGH (ref 11.4–15.2)

## 2024-06-12 LAB — LACTATE DEHYDROGENASE: LDH: 244 U/L — ABNORMAL HIGH (ref 98–192)

## 2024-06-12 NOTE — Progress Notes (Signed)
 VAD CLINIC NOTE    HPI:  Johnny Oneill is a 83 y.o. 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   Presented to ER on 06/27/19 with syncopal episode and head injury from fall. ECG showed VT @ 240. Underwent emergent DC-CV in ER. Head CT no bleed. Admitted and had ICD gen replacement (EOL). -> Did not tolerate amio due to tremors.  Had ICD shock for VT on 06/09/20. Started on sotalol   On 02/06/21 Paced out of AFL.  Seen in ER 05/2021 due to LOW FLOW alarms. Found to be in VT rate 170. DCCV x 1 - returned to SR with frequent PVCs. Pacer settings adjusted by Jodie Passey PA- DDD 90.  RHC 11/22 to assess for RV failure RA = 5 RV = 33/5 PA = 33/13 (20) PCW = 10 Fick cardiac output/index = 4.0/2.1 Thermo CO/CI = 4.9/2.6 PVR = 2.0 WU Ao sat = 95% PA sat = 54%, 59% PAPI = 4.0  Admitted 12/01/21 for R TKA. Did well with surgery but complicated by severe orthostatic hypotension. Midodrine  added    Admitted 12/28/21 with recurrent VT. Had DC-CV in ER. Sotalol  restarted. Continued to have elevated MAPs as well as orthostasis. Midodrine  decreased and florinef  added.   Admitted 02/09/22 with  a/c RV failure w/ cardiogenic shock. LFTs, INR, creatinine elevated on admit. Started on empiric milrinone  with gradual improvement. LFTs/INR/creatinine improved. Had RHC primary left sided failure. LVAD speed optimized with speed increased to 9800.  Milrinone  stopped without difficulty. INR followed closely by pharmacy with adjustments to coumadin .   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 Seen by Rheum and placed on allopurinol with good control.   Here for f/u. Doing very well. Continues to ref for volleyball. Taking midodrine  daily. Denies orthopnea or PND. No fevers, chills or problems with driveline. No bleeding, melena or neuro symptoms. No VAD alarms. Taking all meds as prescribed.      LVAD  Documentation    06/12/2024  Device Info  LVAD Type: Heartmate II  Date of Implant: 09/18/2013  Therapy Type: Destination Therapy      06/12/2024  Vitals  Heart Rate: 86 BPM  Automatic BP: 127/106  Doppler MAP: 130 mmHg  SpO2: --    Last 3 Weights Weight Weight  06/12/2024 76.295 kg 168 lb 3.2 oz  03/28/2024 75.116 kg 165 lb 9.6 oz  01/25/2024 75.297 kg 166 lb       06/12/2024  LVAD Paramaters  Speed: 9800 RPM  Flow: 5 LPM  PI: 7  Power: 6 Watts  Alarms: none. hx: DL fault  Events: 79-69  Last Speed Change Date: 02/18/2022  Last Ramp Echo Date: 01/03/2021  Last Right Heart Cath Date: 02/12/2022  Bleeding History: No  Type of Dressing: Weekly  Annual Maintenance Date: 09/27/2023    Labs    Units 06/12/24 0959 05/31/24 1041 05/10/24 1029 04/19/24 1052 03/28/24 1036 02/15/24 1106 01/25/24 1353 12/15/23 1004 11/22/23 1409  INR   --  2.7* 2.4* 2.3* 2.8*   < > 3.0*   < > 3.0*  LDH U/L  --   --   --   --  281*  --  233*  --  208*  HGB g/dL 87.3*  --   --   --  87.9*  --  12.4*  --  12.2*  CREATININE mg/dL  --   --   --   --  2.31*  --  2.25*  --  2.29*   < > = values in this interval not displayed.       Annual Equipment Maintenance on UBC/PM was performed 09/27/23.   I reviewed the LVAD parameters from today and compared the results to the patient's prior recorded data. LVAD interrogation was NEGATIVE for significant power changes, NEGATIVE for clinical alarms and POSITIVE for PI events/speed drops. No programming changes were made and pump is functioning within specified parameters. Pt is performing daily controller and system monitor self tests along with completing weekly and monthly maintenance for LVAD equipment.   Past Medical History:  Diagnosis Date   Anxiety    Automatic implantable cardioverter-defibrillator in situ    CAD (coronary artery disease)    S/P stenting of LCX in 2004;  s/p Lat MI 2009 with occlusion of the LCX - treated with Promus stenting.  Has total occlusion of the RCA.    CHF (congestive heart failure) (HCC)    Hypercholesteremia    Hypertension    Hypothyroidism    ICD (implantable cardiac defibrillator) in place    PROPHYLACTIC      medtronic   Inferior MI [facility]) ? date; 2009   Ischemic cardiomyopathy    a. 10/09 Echo: Sev LV Dysfxn, inf/lat AK, Mod MR.   Liver disease    LVAD (left ventricular assist device) present (HCC)    Osteoarthritis    a. s/p R TKR   Pacemaker    PVC (premature ventricular contraction)    RBBB    Shortness of breath     Current Outpatient Medications  Medication Sig Dispense Refill   acetaminophen  (TYLENOL ) 500 MG tablet Take 1,000 mg by mouth every 6 (six) hours as needed (for pain or headaches).     allopurinol (ZYLOPRIM) 100 MG tablet Take 50 mg by mouth daily. (Patient taking differently: Take 100 mg by mouth daily.)     aspirin  EC 81 MG tablet Take 81 mg by mouth in the morning.     Cholecalciferol (VITAMIN D3) 125 MCG (5000 UT) CAPS Take 5,000 Units by mouth daily with breakfast.     citalopram  (CELEXA ) 10 MG tablet Take 1 tablet by mouth once daily 90 tablet 3   Coenzyme Q10 (COQ10) 200 MG CAPS Take 200 mg by mouth in the morning.     Famotidine  (PEPCID  PO) Take 10 mg by mouth every morning.     levothyroxine  (SYNTHROID ) 175 MCG tablet Take 0.5-1 tablets (87.5-175 mcg total) by mouth See admin instructions. Take 1 tablet daily every morning except half tablet on Sundays 90 tablet 2   mexiletine (MEXITIL ) 150 MG capsule Take 1 capsule by mouth twice daily 60 capsule 11   midodrine  (PROAMATINE ) 5 MG tablet TAKE 1 TABLET BY MOUTH TWICE DAILY AS NEEDED TAKE  5  MG  IF  DOPPLER  IS  90  OR  LESS 60 tablet 0   pantoprazole  (PROTONIX ) 40 MG tablet Take 1 tablet by mouth once daily 90 tablet 0   Probiotic Product (PROBIOTIC PO) Take 1 capsule by mouth in the morning.     rosuvastatin  (CRESTOR ) 20 MG tablet Take 1 tablet by mouth once daily 90 tablet 0   sotalol  (BETAPACE ) 80 MG tablet  Take 1 tablet (80 mg total) by mouth daily. 90 tablet 3   traZODone  (DESYREL ) 50 MG tablet Take 1 tablet (50 mg total) by mouth at bedtime. 30 tablet 6   warfarin (COUMADIN ) 5 MG tablet Take 2.5 mg (  1/2 tablet) every Mon, Fri; 5 mg (whole tablet) all other days; or as directed by Heart Failure Clinic 90 tablet 3   HYDROcodone -acetaminophen  (NORCO/VICODIN) 5-325 MG tablet Take 1 tablet by mouth every 4 (four) hours as needed. (Patient not taking: Reported on 06/12/2024) 15 tablet 0   melatonin 5 MG TABS Take 5 mg by mouth at bedtime as needed (for sleep). (Patient not taking: Reported on 06/12/2024)     No current facility-administered medications for this encounter.   Facility-Administered Medications Ordered in Other Encounters  Medication Dose Route Frequency Provider Last Rate Last Admin   etomidate  (AMIDATE ) injection    Anesthesia Intra-op Decarlo, Valerie M, CRNA   20 mg at 02/08/14 0915   rocuronium  (ZEMURON ) injection    Anesthesia Intra-op Decarlo, Valerie M, CRNA   50 mg at 02/08/14 0932   succinylcholine  (ANECTINE ) injection    Anesthesia Intra-op Decarlo, Valerie M, CRNA   100 mg at 02/08/14 0915   Ace inhibitors, Amiodarone , Diovan [valsartan], Lipitor [atorvastatin  calcium ], Norvasc  [amlodipine ], and Jardiance  [empagliflozin ]  Review of systems complete and found to be negative unless listed in HPI.     Wt Readings from Last 3 Encounters:  03/28/24 75.1 kg (165 lb 9.6 oz)  01/25/24 75.3 kg (166 lb)  11/22/23 74.8 kg (165 lb)    Physical Exam: General:  NAD.  HEENT: normal  Neck: supple. JVP not elevated.  Carotids 2+ bilat; no bruits. No lymphadenopathy or thryomegaly appreciated. Cor: LVAD hum.  Lungs: Clear. Abdomen: soft, nontender, non-distended. No hepatosplenomegaly. No bruits or masses. Good bowel sounds. Driveline site clean. Anchor in place.  Extremities: no cyanosis, clubbing, rash. Warm no edema  Neuro: alert & oriented x 3. No focal deficits. Moves all 4  without problem    ASSESSMENT AND PLAN:   1) Chronic systolic HF: ICM, EF 20-25% s/p LVAD HM II implant 08/2013 for DT.   - Echo 3/18 EF 10-15% mild AI. Mod RV dysfunction. LVAD cannula OK - has struggled with RV failure however RHC 11/22 looked good (despite evidence of RVF on LE dopplers showing reflux - Doing well.NYHA I-II Orthostasis managed with daily midodrine  - Volume status ok  -Failed Jardiance  due to volume depletion - He has not tolerated ACE-I or amlodipine  or b-blockers   2) LVAD: HMII 08/2013 for DT. - VAD interrogated personally. Parameters stable. - Has h/o DL fault alarms. These were reviewed personally by me and VAD Coordinator and discussed with [employer] tech team again several times in past -> Advised by [employer] Clinical rep and Engineer that since patient is asymptomatic to send log files at all follow up appointments to [facility] primary lead. Log files sent to [employer] again today - Remains on ASA and warfarin (HM-2) - LDH 244 - INR 3.2 Goal 2.0-2.5 Discussed warfarin dosing with PharmD personally. - DL site ok - MAPs remain elevated  in setting of midodrine  but hasn't tolerating lowering in setting of orthostasis  3) Driveline fault alerts - log files sent to [employer] as above   4) HTN complicated by orthostatic hypotension - MAPs remain elevated  but hasn't tolerating lowering in setting of orthostasis  - No change  5) VT/Frequent PVCs - s/p ICD gen change.  - Off amio due to cerebellar side effects.  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 - VT  06/16/21 with  DC-CV x 1 - Recurrent VT 12/28/21 after stopping sotalol . Sotalol  restarted. - PVCs improved on mexilitene.  Continue mexilitene - Keep K > 4.0 Mg > 2.0 (  K 4.5 mg 2.2 today) - No recent VT - Continue sotalol  and mexilitene. Continue to follow with EP. - Watch sotalol  dosing with CKD. Scr stable at 2.2 today  6) Atrial flutter, paroxysmal - patient paced out of AFL in 6/22. Atrial therapies enabled  - continue sotalol   (on for VT). QTs slightly prolonged but have been stable - in NSR today  - on warfarin  7) CAD - no s/s angina - continue Crestor   9) CKD IV - Creatinine baseline 1.8-2.4 - Scr today stable at 2.25 -> 2.31 -> 2.20 - failed Jardiance  - Follows with Dr. Gearline (Renal)  10) Chronic gout - Following with Rheum - Improved on allopurinol   I spent a total of 37 minutes today: 1) reviewing the patient's medical records including previous charts, labs and recent notes from other providers; 2) examining the patient and counseling them on their medical issues/explaining the plan of care; 3) adjusting meds as needed and 4) ordering lab work or other needed tests.    Toribio Fuel, MD  10:21 AM

## 2024-06-12 NOTE — Progress Notes (Addendum)
 Patient presents for 2 month follow up in VAD Clinic today alone. Reports no problems with VAD equipment or concerns with drive line.   Pt ambulated into clinic unassisted. Denies lightheadedness, falls, shortness of breath, and signs of bleeding. Reports dizziness when he bends over but lately this has improved. This typically resolves with position change, and is not new for him.  Pt endorses good appetite. Admits he is not drinking as much as he should everyday. He remains active without limitation.   He reports he is taking 5 mg of Midodrine  daily in the morning and has it PRN for the afternoon. His orthostatic symptoms have greatly improved since starting this.   Pt is following with [facility]. Pt states that the team there told him to stop drinking dark colored drinks and soda. Pt has follow up with their team. He is currently not taking any diuretics.  He is followed by [facility]. Since starting Allopurinol 150 mg daily his symptoms have greatly improved.   History of Drive [employer]. No drive line fault alarms seen on interrogation today. Advised by [employer] Clinical rep and Engineer that since patient is asymptomatic to send log files at all follow up appointments to trend red primary lead. Log files sent to [employer] today. Pt states he has not had any alarms on his controller and remains asymptomatic. Pt instructed again to call VAD Coordinator if any alarming occurs.   Per [employer]: The event log captured routine events, the VAD and peripheral equipment are functioning as intended. No driveline fault events were noted in the log. Patient ID [id number] JF  Controller SW 7.29  Date range 06/04/2024 16:33 06/12/2024 10:02  Fixed Speed 9800  Low Speed Limit 9200  Actual Speed Avg 9774  Power max 6.9  Power min 5  Power avg 5.9  Flow max 6.3  Flow min 3.3  Flow avg 4.5  PI max 6.9  PI min 2.2  PI avg 4.6  PI event total 206   Vital Signs:   Doppler Pressure:  130 Automatic BP: 127/106 (114) HR: 86 SR SPO2: UTO   Weight: 168.2 lb w/ eqt Last weight: 165.6 lb w/eqt  VAD Indication: Destination therapy- patient choice     VAD interrogation & Equipment Management: Speed: 9800 Flow: 4.8 Power: 6.0 w    PI: 6.8   Alarms: none hx- drive line fault Events: 20 - 30 daily  Fixed speed: 9800  Low speed limit: 9200   Primary Controller: Replace back up battery in 30 months Back up controller:  Replace back up battery in 26 months-- not present   Annual Equipment Maintenance on UBC/PM was performed 09/27/23.   I reviewed the LVAD parameters from today and compared the results to the patient's prior recorded data. LVAD interrogation was NEGATIVE for significant power changes, NEGATIVE for clinical alarms and POSITIVE for PI events/speed drops. No programming changes were made and pump is functioning within specified parameters. Pt is performing daily controller and system monitor self tests along with completing weekly and monthly maintenance for LVAD equipment.  Exit Site Care: Drive line is being maintained weekly by Dagoberto, his wife. Existing VAD dressing C/D/I. Pt given 8 weekly kits for at home use.  Significant Events on VAD Support:  10/2013>SBO 12/2016> elevated LDH, admit for Bival   Device: Dual Medtronic Therapies:  on 231  Pacing: DDD 80 bpm  Last check:  03/23/2024  BP & Labs:  Doppler 130 - Doppler is reflecting modified systolic  Hgb  12.6 - No S/S of bleeding. Specifically denies melena/BRBPR or nosebleeds.   LDH 244 - established baseline of 200- 350. Denies tea-colored urine. No power elevations noted on interrogation.    Patient Instructions:  No medication changes Coumadin  dosing per Lauren PharmD Return to clinic in 2 months for follow up with Dr Cherrie  Isaiah Knoll RN VAD Coordinator  Office: [phone]  24/7 Pager: [phone]

## 2024-06-12 NOTE — Patient Instructions (Signed)
No medication changes Coumadin dosing per Lauren PharmD Return to clinic in 2 months for follow up with Dr Bensimhon.    

## 2024-06-20 ENCOUNTER — Other Ambulatory Visit (HOSPITAL_COMMUNITY): Payer: Self-pay | Admitting: Internal Medicine

## 2024-06-20 DIAGNOSIS — I5022 Chronic systolic (congestive) heart failure: Secondary | ICD-10-CM

## 2024-06-20 DIAGNOSIS — Z95811 Presence of heart assist device: Secondary | ICD-10-CM

## 2024-06-20 DIAGNOSIS — I472 Ventricular tachycardia, unspecified: Secondary | ICD-10-CM

## 2024-06-21 ENCOUNTER — Other Ambulatory Visit (HOSPITAL_COMMUNITY): Payer: Self-pay | Admitting: Internal Medicine

## 2024-06-21 DIAGNOSIS — I255 Ischemic cardiomyopathy: Secondary | ICD-10-CM

## 2024-06-22 ENCOUNTER — Ambulatory Visit

## 2024-06-22 DIAGNOSIS — I255 Ischemic cardiomyopathy: Secondary | ICD-10-CM | POA: Diagnosis not present

## 2024-06-23 LAB — CUP PACEART REMOTE DEVICE CHECK
Battery Remaining Longevity: 23 mo
Battery Voltage: 2.91 V
Brady Statistic AP VP Percent: 97.12 %
Brady Statistic AP VS Percent: 0.2 %
Brady Statistic AS VP Percent: 2.65 %
Brady Statistic AS VS Percent: 0.03 %
Brady Statistic RA Percent Paced: 97.22 %
Brady Statistic RV Percent Paced: 99.7 %
Date Time Interrogation Session: 20251023001802
HighPow Impedance: 43 Ohm
HighPow Impedance: 57 Ohm
Implantable Lead Connection Status: 753985
Implantable Lead Connection Status: 753985
Implantable Lead Implant Date: 20091103
Implantable Lead Implant Date: 20091103
Implantable Lead Location: 753859
Implantable Lead Location: 753860
Implantable Lead Model: 5076
Implantable Lead Model: 6947
Implantable Pulse Generator Implant Date: 20201028
Lead Channel Impedance Value: 342 Ohm
Lead Channel Impedance Value: 399 Ohm
Lead Channel Impedance Value: 399 Ohm
Lead Channel Pacing Threshold Amplitude: 0.5 V
Lead Channel Pacing Threshold Amplitude: 1.25 V
Lead Channel Pacing Threshold Pulse Width: 0.4 ms
Lead Channel Pacing Threshold Pulse Width: 0.4 ms
Lead Channel Sensing Intrinsic Amplitude: 2.125 mV
Lead Channel Sensing Intrinsic Amplitude: 2.125 mV
Lead Channel Sensing Intrinsic Amplitude: 6.125 mV
Lead Channel Sensing Intrinsic Amplitude: 6.125 mV
Lead Channel Setting Pacing Amplitude: 1.5 V
Lead Channel Setting Pacing Amplitude: 2.75 V
Lead Channel Setting Pacing Pulse Width: 0.6 ms
Lead Channel Setting Sensing Sensitivity: 0.3 mV

## 2024-06-26 ENCOUNTER — Ambulatory Visit: Payer: Self-pay | Admitting: Cardiology

## 2024-06-26 NOTE — Progress Notes (Signed)
 Remote ICD Transmission

## 2024-06-29 ENCOUNTER — Telehealth (HOSPITAL_COMMUNITY): Payer: Self-pay | Admitting: *Deleted

## 2024-06-29 ENCOUNTER — Other Ambulatory Visit (HOSPITAL_COMMUNITY): Payer: Self-pay | Admitting: Internal Medicine

## 2024-06-29 DIAGNOSIS — Z95811 Presence of heart assist device: Secondary | ICD-10-CM

## 2024-06-29 DIAGNOSIS — I5022 Chronic systolic (congestive) heart failure: Secondary | ICD-10-CM

## 2024-06-29 NOTE — Telephone Encounter (Signed)
 Received call from [place] reporting pt was out walking the dog in the grass and he slipped and fell on his right elbow and right hip. Denies hitting his head. Notes bruising with small hematoma above right elbow, and a bruise on his right hip. Pt does not think he needs xrays of elbow and hip. Advised to notify VAD coordinators if he changes his mind about xrays, or if hematoma/bruising worsens. She verbalized understanding.   Isaiah Knoll RN VAD Coordinator  Office: [phone]  24/7 Pager: [phone]

## 2024-06-30 ENCOUNTER — Other Ambulatory Visit (HOSPITAL_COMMUNITY): Payer: Self-pay | Admitting: *Deleted

## 2024-06-30 DIAGNOSIS — Z95811 Presence of heart assist device: Secondary | ICD-10-CM

## 2024-06-30 DIAGNOSIS — Z7901 Long term (current) use of anticoagulants: Secondary | ICD-10-CM

## 2024-07-03 ENCOUNTER — Ambulatory Visit (HOSPITAL_COMMUNITY): Payer: Self-pay | Admitting: Pharmacist

## 2024-07-03 ENCOUNTER — Ambulatory Visit (HOSPITAL_COMMUNITY)
Admission: RE | Admit: 2024-07-03 | Discharge: 2024-07-03 | Disposition: A | Discharge status: Home / Self Care | Source: Ambulatory Visit | Attending: Cardiology | Admitting: Cardiology

## 2024-07-03 DIAGNOSIS — Z7901 Long term (current) use of anticoagulants: Secondary | ICD-10-CM | POA: Insufficient documentation

## 2024-07-03 DIAGNOSIS — Z95811 Presence of heart assist device: Secondary | ICD-10-CM | POA: Insufficient documentation

## 2024-07-03 DIAGNOSIS — Z4509 Encounter for adjustment and management of other cardiac device: Secondary | ICD-10-CM | POA: Insufficient documentation

## 2024-07-03 LAB — PROTIME-INR
INR: 2.8 — ABNORMAL HIGH (ref 0.8–1.2)
Prothrombin Time: 30.5 s — ABNORMAL HIGH (ref 11.4–15.2)

## 2024-07-21 ENCOUNTER — Other Ambulatory Visit (HOSPITAL_COMMUNITY): Payer: Self-pay

## 2024-07-21 DIAGNOSIS — Z95811 Presence of heart assist device: Secondary | ICD-10-CM

## 2024-07-21 DIAGNOSIS — Z7901 Long term (current) use of anticoagulants: Secondary | ICD-10-CM

## 2024-07-24 ENCOUNTER — Ambulatory Visit (HOSPITAL_COMMUNITY)
Admission: RE | Admit: 2024-07-24 | Discharge: 2024-07-24 | Disposition: A | Discharge status: Home / Self Care | Source: Ambulatory Visit | Attending: Cardiology | Admitting: Cardiology

## 2024-07-24 ENCOUNTER — Ambulatory Visit (HOSPITAL_COMMUNITY): Payer: Self-pay | Admitting: Pharmacist

## 2024-07-24 DIAGNOSIS — Z7901 Long term (current) use of anticoagulants: Secondary | ICD-10-CM | POA: Diagnosis not present

## 2024-07-24 DIAGNOSIS — Z95811 Presence of heart assist device: Secondary | ICD-10-CM | POA: Diagnosis not present

## 2024-07-24 DIAGNOSIS — Z4509 Encounter for adjustment and management of other cardiac device: Secondary | ICD-10-CM | POA: Diagnosis present

## 2024-07-24 LAB — PROTIME-INR
INR: 2.9 — ABNORMAL HIGH (ref 0.8–1.2)
Prothrombin Time: 31.7 s — ABNORMAL HIGH (ref 11.4–15.2)

## 2024-08-03 ENCOUNTER — Other Ambulatory Visit (HOSPITAL_COMMUNITY): Payer: Self-pay | Admitting: *Deleted

## 2024-08-03 DIAGNOSIS — Z7901 Long term (current) use of anticoagulants: Secondary | ICD-10-CM

## 2024-08-03 DIAGNOSIS — Z95811 Presence of heart assist device: Secondary | ICD-10-CM

## 2024-08-07 ENCOUNTER — Ambulatory Visit (HOSPITAL_COMMUNITY): Payer: Self-pay | Admitting: Pharmacist

## 2024-08-07 ENCOUNTER — Ambulatory Visit (HOSPITAL_COMMUNITY)
Admission: RE | Admit: 2024-08-07 | Discharge: 2024-08-07 | Disposition: A | Source: Ambulatory Visit | Attending: Internal Medicine | Admitting: Internal Medicine

## 2024-08-07 ENCOUNTER — Ambulatory Visit (HOSPITAL_COMMUNITY): Admitting: Internal Medicine

## 2024-08-07 VITALS — BP 110/0 | HR 86 | Wt 165.4 lb

## 2024-08-07 DIAGNOSIS — N184 Chronic kidney disease, stage 4 (severe): Secondary | ICD-10-CM | POA: Diagnosis not present

## 2024-08-07 DIAGNOSIS — I472 Ventricular tachycardia, unspecified: Secondary | ICD-10-CM

## 2024-08-07 DIAGNOSIS — Z95811 Presence of heart assist device: Secondary | ICD-10-CM

## 2024-08-07 DIAGNOSIS — I5022 Chronic systolic (congestive) heart failure: Secondary | ICD-10-CM

## 2024-08-07 DIAGNOSIS — I480 Paroxysmal atrial fibrillation: Secondary | ICD-10-CM

## 2024-08-07 DIAGNOSIS — Z7901 Long term (current) use of anticoagulants: Secondary | ICD-10-CM

## 2024-08-07 LAB — BASIC METABOLIC PANEL WITH GFR
Anion gap: 7 (ref 5–15)
BUN: 33 mg/dL — ABNORMAL HIGH (ref 8–23)
CO2: 25 mmol/L (ref 22–32)
Calcium: 9 mg/dL (ref 8.9–10.3)
Chloride: 104 mmol/L (ref 98–111)
Creatinine, Ser: 2.26 mg/dL — ABNORMAL HIGH (ref 0.61–1.24)
GFR, Estimated: 29 mL/min — ABNORMAL LOW (ref >60)
Glucose, Bld: 167 mg/dL — ABNORMAL HIGH (ref 70–99)
Potassium: 4.5 mmol/L (ref 3.5–5.1)
Sodium: 136 mmol/L (ref 135–145)

## 2024-08-07 LAB — CBC
HCT: 40.5 % (ref 39.0–52.0)
Hemoglobin: 13.2 g/dL (ref 13.0–17.0)
MCH: 30.3 pg (ref 26.0–34.0)
MCHC: 32.6 g/dL (ref 30.0–36.0)
MCV: 93.1 fL (ref 80.0–100.0)
Platelets: 175 K/uL (ref 150–400)
RBC: 4.35 MIL/uL (ref 4.22–5.81)
RDW: 16.6 % — ABNORMAL HIGH (ref 11.5–15.5)
WBC: 6.6 K/uL (ref 4.0–10.5)
nRBC: 0 % (ref 0.0–0.2)

## 2024-08-07 LAB — LACTATE DEHYDROGENASE: LDH: 268 U/L — ABNORMAL HIGH (ref 105–235)

## 2024-08-07 LAB — PROTIME-INR
INR: 3.2 — ABNORMAL HIGH (ref 0.8–1.2)
Prothrombin Time: 34.4 s — ABNORMAL HIGH (ref 11.4–15.2)

## 2024-08-07 NOTE — Progress Notes (Signed)
 Patient presents for 2 month follow up in VAD Clinic today alone. Reports no problems with VAD equipment or concerns with drive line.   Pt ambulated into clinic unassisted. Denies lightheadedness, falls, shortness of breath, and signs of bleeding. Reports dizziness when he bends over and stands too quickly. This typically resolves with position change, and is not new for him.  Pt endorses good appetite. Admits he is not drinking as much as he should everyday. He remains active without limitation.   He reports he is taking 5 mg of Midodrine  daily in the morning and has it PRN for the afternoon. But he hasn't taken any in the last 2 months.   Pt is following with [facility]. Pt states that the team there told him to stop drinking dark colored drinks and soda. Pt has follow up with their team. He is currently not taking any diuretics.  He is followed by [facility]. Pt states he is taking Allopurinol 100 mg daily and his symptoms have greatly improved.   History of Drive [employer]. Pt having drive line fault alarms seen on interrogation today from 12/5-12/8. Advised by [employer] Clinical rep and Engineer that since patient is asymptomatic to send log files at all follow up appointments to trend red primary lead. Log files sent to [employer] today. Pt states he has not had any alarms on his controller and remains asymptomatic. Pt instructed again to call VAD Coordinator if any alarming occurs.   Per [employer]:  The log file captured intermittent DL faults between 87/4/74 and 08/07/24. There were no other unusual events recorded in the log file event history.   The pump settings & operating ranges are listed below. The pump parameters trending can be reviewed in the graphs below. The DL phase data below shows phase A has degraded since the last log file from 06/12/24. The degraded wire in phase A is not completely fractured and it is still functioning but not at 100%  Date Phase A Phase B Phase C   06/12/2024 84 80 79  08/07/2024 104 78 76    Patient ID [id number]  Controller SW 7.29  Date range 08/04/2024 4:10 08/07/2024 12:13  Fixed Speed 9800  Low Speed Limit 9200  Actual Speed Avg 9775  Power max 6.4  Power min 4.8  Power avg 5.6  Flow max 5.4  Flow min 3.1  Flow avg 4.0  PI max 7.1  PI min 1.7  PI avg 4.1  PI event total 207   [employer] recommendations: If the external DL section has not been replaced that could be attempted to see if that resolves the issue. If the degraded wire in phase A fractures the log file will be filled with DL faults & as long as the redundant wire in phase A is still functioning the pump will continue to function.  Pt has not had an external DL section replaced before. We will work with [employer] to arrange this as soon as possible.  Vital Signs:   Doppler Pressure: 110 Automatic BP: 121/106 (112) HR: 86 SR SPO2: UTO   Weight: 165.4 lb w/ eqt Last weight: 168.2 lb w/eqt  VAD Indication: Destination therapy- patient choice     VAD interrogation & Equipment Management: Speed: 9800 Flow: 4.6 Power: 6.0 w    PI: 6.6   Alarms: daily drive line fault Events: 49-39 daily  Fixed speed: 9800  Low speed limit: 9200   Primary Controller: Replace back up battery in 28 months Back  up controller:  Replace back up battery in 24 months-- not present   Annual Equipment Maintenance on UBC/PM was performed 09/27/23.   I reviewed the LVAD parameters from today and compared the results to the patient's prior recorded data. LVAD interrogation was NEGATIVE for significant power changes, NEGATIVE for clinical alarms and POSITIVE for PI events/speed drops. No programming changes were made and pump is functioning within specified parameters. Pt is performing daily controller and system monitor self tests along with completing weekly and monthly maintenance for LVAD equipment.  Exit Site Care: Drive line is being maintained weekly by Dagoberto,  his wife. Existing VAD dressing C/D/I. Pt given 8 weekly kits for at home use.  Significant Events on VAD Support:  10/2013>SBO 12/2016> elevated LDH, admit for Bival   Device: Dual Medtronic Therapies:  on 231  Pacing: DDD 80 bpm  Last check:  03/23/2024  BP & Labs:  Doppler 110 - Doppler is reflecting modified systolic  Hgb 13.2 - No S/S of bleeding. Specifically denies melena/BRBPR or nosebleeds.   LDH 268 - established baseline of 200- 350. Denies tea-colored urine. No power elevations noted on interrogation.    Patient Instructions:  No medication changes Coumadin  dosing per Lauren PharmD Return to clinic in 2 months for follow up with Dr Cherrie  Lauraine Ip RN VAD Coordinator  Office: [phone]  24/7 Pager: [phone]

## 2024-08-07 NOTE — Progress Notes (Signed)
 VAD CLINIC NOTE    HPI:  Johnny Oneill is a 83 y.o. 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   Presented to ER on 06/27/19 with syncopal episode and head injury from fall. ECG showed VT @ 240. Underwent emergent DC-CV in ER. Head CT no bleed. Admitted and had ICD gen replacement (EOL). -> Did not tolerate amio due to tremors.  Had ICD shock for VT on 06/09/20. Started on sotalol   On 02/06/21 Paced out of AFL.  Seen in ER 05/2021 due to LOW FLOW alarms. Found to be in VT rate 170. DCCV x 1 - returned to SR with frequent PVCs. Pacer settings adjusted by Jodie Passey PA- DDD 90.  RHC 11/22 to assess for RV failure RA = 5 RV = 33/5 PA = 33/13 (20) PCW = 10 Fick cardiac output/index = 4.0/2.1 Thermo CO/CI = 4.9/2.6 PVR = 2.0 WU Ao sat = 95% PA sat = 54%, 59% PAPI = 4.0  Admitted 12/01/21 for R TKA. Did well with surgery but complicated by severe orthostatic hypotension. Midodrine  added    Admitted 12/28/21 with recurrent VT. Had DC-CV in ER. Sotalol  restarted. Continued to have elevated MAPs as well as orthostasis. Midodrine  decreased and florinef  added.   Admitted 02/09/22 with  a/c RV failure w/ cardiogenic shock. LFTs, INR, creatinine elevated on admit. Started on empiric milrinone  with gradual improvement. LFTs/INR/creatinine improved. Had RHC primary left sided failure. LVAD speed optimized with speed increased to 9800.  Milrinone  stopped without difficulty. INR followed closely by pharmacy with adjustments to coumadin .   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 Seen by Rheum and placed on allopurinol with good control.   Here for f/u. Doing well remains active. Continues to take daily midodrine .Denies edema, orthopnea or PND. No fevers, chills  or problems with driveline. No bleeding, melena or neuro symptoms. Getting driveline fault alarms. Taking all meds as prescribed.       LVAD Documentation    08/07/2024  Device Info  LVAD Type: Heartmate II  Date of Implant: 09/18/2013  Therapy Type: Destination Therapy      08/07/2024  Vitals  Heart Rate: 86 BPM  Automatic BP: 121/106  Doppler MAP: 110 mmHg    Last 3 Weights Weight Weight  08/07/2024 75.025 kg 165 lb 6.4 oz  06/12/2024 76.295 kg 168 lb 3.2 oz  03/28/2024 75.116 kg 165 lb 9.6 oz       08/07/2024  LVAD Paramaters  Speed: 9800 RPM  Flow: 5 LPM  PI: 7  Power: 6 Watts  Alarms: daily DL fault  Events: 19+  Last Speed Change Date: 02/18/2022  Last Ramp Echo Date: 01/03/2021  Last Right Heart Cath Date: 02/12/2022  Bleeding History: No  Type of Dressing: Weekly  Annual Maintenance Date: 09/27/2023    Labs    Units 07/24/24 1016 07/03/24 1115 06/12/24 0959 04/19/24 1052 03/28/24 1036 02/15/24 1106 01/25/24 1353  INR  2.9* 2.8* 3.2*   < > 2.8*   < > 3.0*  LDH U/L  --   --  244*  --  281*  --  233*  HGB g/dL  --   --  87.3*  --  87.9*  --  12.4*  CREATININE mg/dL  --   --  7.79*  --  7.68*  --  2.25*   < > = values in this interval not displayed.  Annual Equipment Maintenance on UBC/PM was performed 09/27/23.   I reviewed the LVAD parameters from today and compared the results to the patient's prior recorded data. LVAD interrogation was NEGATIVE for significant power changes, NEGATIVE for clinical alarms and POSITIVE for PI events/speed drops. No programming changes were made and pump is functioning within specified parameters. Pt is performing daily controller and system monitor self tests along with completing weekly and monthly maintenance for LVAD equipment.   Past Medical History:  Diagnosis Date   Anxiety    Automatic implantable cardioverter-defibrillator in situ    CAD (coronary artery disease)    S/P stenting of LCX in 2004;  s/p Lat MI 2009 with occlusion of the LCX - treated with Promus stenting. Has total occlusion of the RCA.    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 date; 2009   Ischemic cardiomyopathy    a. 10/09 Echo: Sev LV Dysfxn, inf/lat AK, Mod MR.   Liver disease    LVAD (left ventricular assist device) present (HCC)    Osteoarthritis    a. s/p R TKR   Pacemaker    PVC (premature ventricular contraction)    RBBB    Shortness of breath     Current Outpatient Medications  Medication Sig Dispense Refill   acetaminophen  (TYLENOL ) 500 MG tablet Take 1,000 mg by mouth every 6 (six) hours as needed (for pain or headaches).     allopurinol (ZYLOPRIM) 100 MG tablet Take 50 mg by mouth daily. (Patient taking differently: Take 100 mg by mouth daily.)     aspirin  EC 81 MG tablet Take 81 mg by mouth in the morning.     Cholecalciferol (VITAMIN D3) 125 MCG (5000 UT) CAPS Take 5,000 Units by mouth daily with breakfast.     citalopram  (CELEXA ) 10 MG tablet Take 1 tablet by mouth once daily 90 tablet 0   Coenzyme Q10 (COQ10) 200 MG CAPS Take 200 mg by mouth in the morning.     Famotidine  (PEPCID  PO) Take 10 mg by mouth every morning.     levothyroxine  (SYNTHROID ) 175 MCG tablet Take 0.5-1 tablets (87.5-175 mcg total) by mouth See admin instructions. Take 1 tablet daily every morning except half tablet on Sundays 90 tablet 2   mexiletine (MEXITIL ) 150 MG capsule Take 1 capsule by mouth twice daily 60 capsule 11   midodrine  (PROAMATINE ) 5 MG tablet TAKE 1 TABLET BY MOUTH TWICE DAILY AS NEEDED TAKE  1  TABLET  (5  MG)  IF  DOPPLER  IS  90  OR  LESS 180 tablet 3   pantoprazole  (PROTONIX ) 40 MG tablet Take 1 tablet by mouth once daily 90 tablet 0   Probiotic Product (PROBIOTIC PO) Take 1 capsule by mouth in the morning.     rosuvastatin  (CRESTOR ) 20 MG tablet Take 1 tablet by mouth once daily 90 tablet 0   sotalol  (BETAPACE ) 80 MG tablet Take 1 tablet by mouth once daily 90 tablet 0   warfarin  (COUMADIN ) 5 MG tablet Take 2.5 mg (1/2 tablet) every Mon, Fri; 5 mg (whole tablet) all other days; or as directed by Heart Failure Clinic 90 tablet 3   HYDROcodone -acetaminophen  (NORCO/VICODIN) 5-325 MG tablet Take 1 tablet by mouth every 4 (four) hours as needed. (Patient not taking: Reported  on 08/07/2024) 15 tablet 0   melatonin 5 MG TABS Take 5 mg by mouth at bedtime as needed (for sleep). (Patient not taking: Reported on 08/07/2024)     traZODone  (DESYREL ) 50 MG tablet Take 1 tablet (50 mg total) by mouth at bedtime. (Patient not taking: Reported on 08/07/2024) 30 tablet 6   No current facility-administered medications for this encounter.   Facility-Administered Medications Ordered in Other Encounters  Medication Dose Route Frequency Provider Last Rate Last Admin   etomidate  (AMIDATE ) injection    Anesthesia Intra-op Decarlo, Valerie M, CRNA   20 mg at 02/08/14 0915   rocuronium  (ZEMURON ) injection    Anesthesia Intra-op Decarlo, Valerie M, CRNA   50 mg at 02/08/14 0932   succinylcholine  (ANECTINE ) injection    Anesthesia Intra-op Decarlo, Valerie M, CRNA   100 mg at 02/08/14 0915   Ace inhibitors, Amiodarone , Diovan [valsartan], Lipitor [atorvastatin  calcium ], Norvasc  [amlodipine ], and Jardiance  [empagliflozin ]  Review of systems complete and found to be negative unless listed in HPI.     Wt Readings from Last 3 Encounters:  08/07/24 75 kg (165 lb 6.4 oz)  06/12/24 76.3 kg (168 lb 3.2 oz)  03/28/24 75.1 kg (165 lb 9.6 oz)    Physical Exam: General:  NAD.  HEENT: normal  Neck: supple. JVP 7  Carotids 2+ bilat; no bruits. No lymphadenopathy or thryomegaly appreciated. Cor: LVAD hum.  Lungs: Clear. Abdomen: soft, nontender, non-distended. No hepatosplenomegaly. No bruits or masses. Good bowel sounds. Driveline site clean. Anchor in place.  Extremities: no cyanosis, clubbing, rash. Warm no edema  Neuro: alert & oriented x 3. No focal deficits. Moves all 4 without problem      ASSESSMENT AND PLAN:   1) Chronic systolic HF: ICM, EF 20-25% s/p LVAD HM II implant 08/2013 for DT.   - Echo 3/18 EF 10-15% mild AI. Mod RV dysfunction. LVAD cannula OK - has struggled with RV failure however RHC 11/22 looked good (despite evidence of RVF on LE dopplers showing reflux - Doing well NYHA I-II Orthostasis managed with daily midodrine  -> MAPs slightly high - Volume status ok  -Failed Jardiance  due to volume depletion - He has not tolerated ACE-I or amlodipine  or b-blockers   2) LVAD: HMII 08/2013 for DT. - VAD interrogated personally. Parameters stable.. - He continue to have DL fault alarms. Reviewed with [employer] engineers again today and these are felt to be getting worse, Recommend attempt at external repair. Will schedule for next available slot with engineers - Remains on ASA and warfarin (HM-2) - LDH 268 - INR 3.2 Goal 2.0-2.5 Discussed warfarin dosing with PharmD personally. - DL site ok - MAPs remain elevated  in setting of midodrine  but hasn't tolerating lowering in setting of orthostasis. No change today  3) Driveline fault alerts - see plan as above. Scheduling repair attempt   4) HTN complicated by orthostatic hypotension - MAPs remain elevated  but hasn't tolerating lowering in setting of orthostasis  - No change  5) VT/Frequent PVCs - s/p ICD gen change.  - Off amio due to cerebellar side effects.  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 - VT  06/16/21 with  DC-CV x 1 - Recurrent VT 12/28/21 after stopping sotalol . Sotalol  restarted. - PVCs improved on mexilitene.  Continue mexilitene - Keep K > 4.0 Mg > 2.0 (K 4.5 mg 2.2 today) - No recent VT - Continue sotalol  and mexilitene. Continue to follow with EP. - Watch sotalol  dosing with CKD. Scr stable at 2.2 today  6) Atrial flutter, paroxysmal - patient paced out of AFL in 6/22. Atrial therapies enabled  - continue sotalol  (on for VT). QTs slightly prolonged but have been stable - in NSR today - on warfarin  7)  CAD - no s/s angina - continue Crestor   9) CKD IV - Creatinine baseline 1.8-2.4 - Scr today stable at 2.25 -> 2.31 -> 2.20 - - failed Jardiance  - Follows with Dr. Gearline (Renal)  10) Chronic gout - Following with Rheum - Improved on allopurinol   I spent a total of 43 minutes today: 1) reviewing the patient's medical records including previous charts, labs and recent notes from other providers; 2) examining the patient and counseling them on their medical issues/explaining the plan of care; 3) adjusting meds as needed and 4) ordering lab work or other needed tests.    Johnny Fuel, MD  12:14 PM

## 2024-08-07 NOTE — Patient Instructions (Signed)
No medication changes Coumadin dosing per Lauren PharmD Return to clinic in 2 months for follow up with Dr Bensimhon.    

## 2024-08-21 ENCOUNTER — Other Ambulatory Visit (HOSPITAL_COMMUNITY): Payer: Self-pay

## 2024-08-21 DIAGNOSIS — Z7901 Long term (current) use of anticoagulants: Secondary | ICD-10-CM

## 2024-08-21 DIAGNOSIS — Z95811 Presence of heart assist device: Secondary | ICD-10-CM

## 2024-08-26 ENCOUNTER — Other Ambulatory Visit (HOSPITAL_COMMUNITY): Payer: Self-pay | Admitting: Internal Medicine

## 2024-08-26 DIAGNOSIS — K219 Gastro-esophageal reflux disease without esophagitis: Secondary | ICD-10-CM

## 2024-08-26 DIAGNOSIS — Z95811 Presence of heart assist device: Secondary | ICD-10-CM

## 2024-08-28 ENCOUNTER — Ambulatory Visit (HOSPITAL_COMMUNITY)
Admission: RE | Admit: 2024-08-28 | Discharge: 2024-08-28 | Disposition: A | Discharge status: Home / Self Care | Source: Ambulatory Visit | Attending: Cardiology | Admitting: Cardiology

## 2024-08-28 ENCOUNTER — Ambulatory Visit (HOSPITAL_COMMUNITY): Payer: Self-pay | Admitting: Pharmacist

## 2024-08-28 DIAGNOSIS — Z95811 Presence of heart assist device: Secondary | ICD-10-CM | POA: Diagnosis present

## 2024-08-28 DIAGNOSIS — Z7901 Long term (current) use of anticoagulants: Secondary | ICD-10-CM | POA: Diagnosis not present

## 2024-08-28 LAB — PROTIME-INR
INR: 3 — ABNORMAL HIGH (ref 0.8–1.2)
Prothrombin Time: 32.1 s — ABNORMAL HIGH (ref 11.4–15.2)

## 2024-09-05 ENCOUNTER — Encounter (HOSPITAL_COMMUNITY): Payer: Self-pay

## 2024-09-06 ENCOUNTER — Observation Stay (HOSPITAL_COMMUNITY)
Admission: RE | Admit: 2024-09-06 | Discharge: 2024-09-08 | Disposition: A | Discharge status: Home / Self Care | Source: Ambulatory Visit | Attending: Cardiology | Admitting: Cardiology

## 2024-09-06 ENCOUNTER — Encounter (HOSPITAL_COMMUNITY): Payer: Self-pay | Admitting: Cardiology

## 2024-09-06 ENCOUNTER — Other Ambulatory Visit: Payer: Self-pay

## 2024-09-06 ENCOUNTER — Inpatient Hospital Stay
Admission: RE | Admit: 2024-09-06 | Discharge: 2024-09-06 | Disposition: A | Discharge status: Home / Self Care | Payer: Self-pay | Source: Ambulatory Visit | Attending: Cardiology | Admitting: Cardiology

## 2024-09-06 ENCOUNTER — Observation Stay (HOSPITAL_COMMUNITY)

## 2024-09-06 DIAGNOSIS — I5022 Chronic systolic (congestive) heart failure: Secondary | ICD-10-CM | POA: Diagnosis not present

## 2024-09-06 DIAGNOSIS — I2510 Atherosclerotic heart disease of native coronary artery without angina pectoris: Secondary | ICD-10-CM | POA: Insufficient documentation

## 2024-09-06 DIAGNOSIS — Z7982 Long term (current) use of aspirin: Secondary | ICD-10-CM | POA: Insufficient documentation

## 2024-09-06 DIAGNOSIS — I130 Hypertensive heart and chronic kidney disease with heart failure and stage 1 through stage 4 chronic kidney disease, or unspecified chronic kidney disease: Secondary | ICD-10-CM | POA: Diagnosis not present

## 2024-09-06 DIAGNOSIS — Z95810 Presence of automatic (implantable) cardiac defibrillator: Secondary | ICD-10-CM | POA: Diagnosis not present

## 2024-09-06 DIAGNOSIS — I4892 Unspecified atrial flutter: Secondary | ICD-10-CM | POA: Diagnosis not present

## 2024-09-06 DIAGNOSIS — Y712 Prosthetic and other implants, materials and accessory cardiovascular devices associated with adverse incidents: Secondary | ICD-10-CM | POA: Diagnosis not present

## 2024-09-06 DIAGNOSIS — T829XXA Unspecified complication of cardiac and vascular prosthetic device, implant and graft, initial encounter: Principal | ICD-10-CM

## 2024-09-06 DIAGNOSIS — Z96653 Presence of artificial knee joint, bilateral: Secondary | ICD-10-CM | POA: Insufficient documentation

## 2024-09-06 DIAGNOSIS — I493 Ventricular premature depolarization: Secondary | ICD-10-CM | POA: Insufficient documentation

## 2024-09-06 DIAGNOSIS — Z7901 Long term (current) use of anticoagulants: Secondary | ICD-10-CM | POA: Insufficient documentation

## 2024-09-06 DIAGNOSIS — I472 Ventricular tachycardia, unspecified: Secondary | ICD-10-CM | POA: Diagnosis not present

## 2024-09-06 DIAGNOSIS — T82598A Other mechanical complication of other cardiac and vascular devices and implants, initial encounter: Secondary | ICD-10-CM | POA: Diagnosis present

## 2024-09-06 DIAGNOSIS — I951 Orthostatic hypotension: Secondary | ICD-10-CM | POA: Diagnosis not present

## 2024-09-06 DIAGNOSIS — N184 Chronic kidney disease, stage 4 (severe): Secondary | ICD-10-CM | POA: Diagnosis not present

## 2024-09-06 DIAGNOSIS — Z79899 Other long term (current) drug therapy: Secondary | ICD-10-CM | POA: Diagnosis not present

## 2024-09-06 DIAGNOSIS — M1A9XX0 Chronic gout, unspecified, without tophus (tophi): Secondary | ICD-10-CM | POA: Diagnosis not present

## 2024-09-06 DIAGNOSIS — Z95811 Presence of heart assist device: Secondary | ICD-10-CM

## 2024-09-06 DIAGNOSIS — I480 Paroxysmal atrial fibrillation: Secondary | ICD-10-CM | POA: Insufficient documentation

## 2024-09-06 LAB — COMPREHENSIVE METABOLIC PANEL WITH GFR
ALT: 15 U/L (ref 0–44)
AST: 28 U/L (ref 15–41)
Albumin: 3.9 g/dL (ref 3.5–5.0)
Alkaline Phosphatase: 106 U/L (ref 38–126)
Anion gap: 12 (ref 5–15)
BUN: 36 mg/dL — ABNORMAL HIGH (ref 8–23)
CO2: 20 mmol/L — ABNORMAL LOW (ref 22–32)
Calcium: 9 mg/dL (ref 8.9–10.3)
Chloride: 104 mmol/L (ref 98–111)
Creatinine, Ser: 2.11 mg/dL — ABNORMAL HIGH (ref 0.61–1.24)
GFR, Estimated: 31 mL/min — ABNORMAL LOW
Glucose, Bld: 113 mg/dL — ABNORMAL HIGH (ref 70–99)
Potassium: 4.2 mmol/L (ref 3.5–5.1)
Sodium: 136 mmol/L (ref 135–145)
Total Bilirubin: 0.4 mg/dL (ref 0.0–1.2)
Total Protein: 6.5 g/dL (ref 6.5–8.1)

## 2024-09-06 LAB — CBC WITH DIFFERENTIAL/PLATELET
Abs Immature Granulocytes: 0.02 K/uL (ref 0.00–0.07)
Basophils Absolute: 0 K/uL (ref 0.0–0.1)
Basophils Relative: 1 %
Eosinophils Absolute: 0.3 K/uL (ref 0.0–0.5)
Eosinophils Relative: 5 %
HCT: 37.4 % — ABNORMAL LOW (ref 39.0–52.0)
Hemoglobin: 12.2 g/dL — ABNORMAL LOW (ref 13.0–17.0)
Immature Granulocytes: 0 %
Lymphocytes Relative: 22 %
Lymphs Abs: 1.2 K/uL (ref 0.7–4.0)
MCH: 30.3 pg (ref 26.0–34.0)
MCHC: 32.6 g/dL (ref 30.0–36.0)
MCV: 93 fL (ref 80.0–100.0)
Monocytes Absolute: 0.6 K/uL (ref 0.1–1.0)
Monocytes Relative: 11 %
Neutro Abs: 3.3 K/uL (ref 1.7–7.7)
Neutrophils Relative %: 61 %
Platelets: 160 K/uL (ref 150–400)
RBC: 4.02 MIL/uL — ABNORMAL LOW (ref 4.22–5.81)
RDW: 16.4 % — ABNORMAL HIGH (ref 11.5–15.5)
WBC: 5.4 K/uL (ref 4.0–10.5)
nRBC: 0 % (ref 0.0–0.2)

## 2024-09-06 LAB — MAGNESIUM: Magnesium: 2 mg/dL (ref 1.7–2.4)

## 2024-09-06 LAB — PROTIME-INR
INR: 3.1 — ABNORMAL HIGH (ref 0.8–1.2)
Prothrombin Time: 33.2 s — ABNORMAL HIGH (ref 11.4–15.2)

## 2024-09-06 LAB — LACTATE DEHYDROGENASE: LDH: 334 U/L — ABNORMAL HIGH (ref 105–235)

## 2024-09-06 LAB — MRSA NEXT GEN BY PCR, NASAL: MRSA by PCR Next Gen: NOT DETECTED

## 2024-09-06 MED ORDER — WARFARIN - PHARMACIST DOSING INPATIENT: Freq: Every day | Status: DC

## 2024-09-06 MED ORDER — SODIUM CHLORIDE 0.9% FLUSH
10.0000 mL | Freq: Two times a day (BID) | INTRAVENOUS | Status: DC
  Administered 2024-09-06: 20 mL
  Administered 2024-09-07 – 2024-09-08 (×3): 10 mL

## 2024-09-06 MED ORDER — CHLORHEXIDINE GLUCONATE CLOTH 2 % EX PADS
6.0000 | MEDICATED_PAD | Freq: Every day | CUTANEOUS | Status: DC
  Administered 2024-09-06 – 2024-09-07 (×2): 6 via TOPICAL

## 2024-09-06 MED ORDER — ASPIRIN 81 MG PO TBEC
81.0000 mg | DELAYED_RELEASE_TABLET | Freq: Every morning | ORAL | Status: DC
  Administered 2024-09-07 – 2024-09-08 (×2): 81 mg via ORAL
  Filled 2024-09-06 (×2): qty 1

## 2024-09-06 MED ORDER — SODIUM CHLORIDE 0.9% FLUSH: 10.0000 mL | INTRAVENOUS | Status: DC | PRN

## 2024-09-06 MED ORDER — MIDODRINE HCL 5 MG PO TABS
5.0000 mg | ORAL_TABLET | Freq: Two times a day (BID) | ORAL | Status: DC
  Administered 2024-09-06 – 2024-09-08 (×4): 5 mg via ORAL
  Filled 2024-09-06 (×4): qty 1

## 2024-09-06 MED ORDER — MEXILETINE HCL 150 MG PO CAPS
150.0000 mg | ORAL_CAPSULE | Freq: Two times a day (BID) | ORAL | Status: DC
  Administered 2024-09-06 – 2024-09-08 (×4): 150 mg via ORAL
  Filled 2024-09-06 (×4): qty 1

## 2024-09-06 MED ORDER — ROSUVASTATIN CALCIUM 20 MG PO TABS
20.0000 mg | ORAL_TABLET | Freq: Every day | ORAL | Status: DC
  Administered 2024-09-07 – 2024-09-08 (×2): 20 mg via ORAL
  Filled 2024-09-06 (×2): qty 1

## 2024-09-06 MED ORDER — LEVOTHYROXINE SODIUM 175 MCG PO TABS: 87.5000 ug | ORAL_TABLET | ORAL | Status: DC

## 2024-09-06 MED ORDER — LEVOTHYROXINE SODIUM 75 MCG PO TABS: 87.5000 ug | ORAL_TABLET | ORAL | Status: DC

## 2024-09-06 MED ORDER — ALLOPURINOL 100 MG PO TABS: 100.0000 mg | ORAL_TABLET | Freq: Every day | ORAL | Status: DC

## 2024-09-06 MED ORDER — MELATONIN 3 MG PO TABS
3.0000 mg | ORAL_TABLET | Freq: Every day | ORAL | Status: DC
  Administered 2024-09-06: 3 mg via ORAL
  Filled 2024-09-06 (×2): qty 1

## 2024-09-06 MED ORDER — PANTOPRAZOLE SODIUM 40 MG PO TBEC
40.0000 mg | DELAYED_RELEASE_TABLET | Freq: Every day | ORAL | Status: DC
  Administered 2024-09-07 – 2024-09-08 (×2): 40 mg via ORAL
  Filled 2024-09-06 (×2): qty 1

## 2024-09-06 MED ORDER — ONDANSETRON HCL 4 MG/2ML IJ SOLN: 4.0000 mg | Freq: Four times a day (QID) | INTRAMUSCULAR | Status: DC | PRN

## 2024-09-06 MED ORDER — FAMOTIDINE 20 MG PO TABS
10.0000 mg | ORAL_TABLET | Freq: Every day | ORAL | Status: DC
  Administered 2024-09-07 – 2024-09-08 (×2): 10 mg via ORAL
  Filled 2024-09-06 (×2): qty 1

## 2024-09-06 MED ORDER — WARFARIN SODIUM 5 MG PO TABS
5.0000 mg | ORAL_TABLET | Freq: Once | ORAL | Status: AC
  Administered 2024-09-06: 5 mg via ORAL
  Filled 2024-09-06: qty 1

## 2024-09-06 MED ORDER — CITALOPRAM HYDROBROMIDE 20 MG PO TABS
10.0000 mg | ORAL_TABLET | Freq: Every day | ORAL | Status: DC
  Administered 2024-09-07 – 2024-09-08 (×2): 10 mg via ORAL
  Filled 2024-09-06 (×2): qty 1

## 2024-09-06 MED ORDER — LEVOTHYROXINE SODIUM 75 MCG PO TABS
175.0000 ug | ORAL_TABLET | ORAL | Status: DC
  Administered 2024-09-07 – 2024-09-08 (×2): 175 ug via ORAL
  Filled 2024-09-06 (×2): qty 1

## 2024-09-06 MED ORDER — SOTALOL HCL 80 MG PO TABS
80.0000 mg | ORAL_TABLET | Freq: Every day | ORAL | Status: DC
  Administered 2024-09-07 – 2024-09-08 (×2): 80 mg via ORAL
  Filled 2024-09-06 (×2): qty 1

## 2024-09-06 MED ORDER — ORAL CARE MOUTH RINSE: 15.0000 mL | OROMUCOSAL | Status: DC | PRN

## 2024-09-06 MED ORDER — ACETAMINOPHEN 325 MG PO TABS: 650.0000 mg | ORAL_TABLET | ORAL | Status: DC | PRN

## 2024-09-06 MED ORDER — ALLOPURINOL 300 MG PO TABS
150.0000 mg | ORAL_TABLET | Freq: Every day | ORAL | Status: DC
  Administered 2024-09-07 – 2024-09-08 (×2): 150 mg via ORAL
  Filled 2024-09-06 (×2): qty 1

## 2024-09-06 NOTE — Progress Notes (Signed)
 Bedside RN paged to notify that PICC team was unable to place PICC line. Discussed with Dr. Rolan. Will ensure patient has two well working PIVs prior to drive line repair and have emergency supplies at bedside at the time of repair.   Schuyler Lunger RN,BSN VAD Coordinator  24/7 pager [phone]

## 2024-09-06 NOTE — H&P (Addendum)
 "    Advanced Heart Failure VAD History and Physical Note    HF Cardiologist:  Dr. Cherrie  Reason for Admission: Drive-line Fault Alarms   HPI:    Johnny Oneill is a 84 y.o. male with HMII LVAD, being admitted for planned DL repair given multiple DL fault alarms.   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    Presented to ER on 06/27/19 with syncopal episode and head injury from fall. ECG showed VT @ 240. Underwent emergent DC-CV in ER. Head CT no bleed. Admitted and had ICD gen replacement (EOL). -> Did not tolerate amio due to tremors.   Had ICD shock for VT on 06/09/20. Started on sotalol    On 02/06/21 Paced out of AFL.   Seen in ER 05/2021 due to LOW FLOW alarms. Found to be in VT rate 170. DCCV x 1 - returned to SR with frequent PVCs. Pacer settings adjusted by Jodie Passey PA- DDD 90.   RHC 11/22 to assess for RV failure RA = 5 RV = 33/5 PA = 33/13 (20) PCW = 10 Fick cardiac output/index = 4.0/2.1 Thermo CO/CI = 4.9/2.6 PVR = 2.0 WU Ao sat = 95% PA sat = 54%, 59% PAPI = 4.0   Admitted 12/01/21 for R TKA. Did well with surgery but complicated by severe orthostatic hypotension. Midodrine  added     Admitted 12/28/21 with recurrent VT. Had DC-CV in ER. Sotalol  restarted. Continued to have elevated MAPs as well as orthostasis. Midodrine  decreased and florinef  added.    Admitted 02/09/22 with  a/c RV failure w/ cardiogenic shock. LFTs, INR, creatinine elevated on admit. Started on empiric milrinone  with gradual improvement. LFTs/INR/creatinine improved. Had RHC primary left sided failure. LVAD speed optimized with speed increased to 9800.  Milrinone  stopped without difficulty. INR followed closely by pharmacy with adjustments to coumadin .    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 Seen by Rheum and placed on allopurinol  with good control.   Recently, he has continued to have  DL fault alarms. Reviewed with [employer] engineers and these are felt to be getting worse. Recommend attempt at external repair.   Feels ok. Denies dyspnea. No CP. Took AM meds prior to arrival. He has had 4 fault alarms thus far today. No pump stops.    LVAD INTERROGATION:  HeartMate II LVAD:  Flow 4.4 liters/min, speed 9800, power 5.8, PI 4.8.  4 fault alarms thus far today   Home Medications Prior to Admission medications  Medication Sig Start Date End Date Taking? Authorizing Provider  acetaminophen  (TYLENOL ) 500 MG tablet Take 1,000 mg by mouth every 6 (six) hours as needed (for pain or headaches).    [provider]  allopurinol  (ZYLOPRIM ) 100 MG tablet Take 50 mg by mouth daily. Patient taking differently: Take 100 mg by mouth daily.    [provider]  aspirin  EC 81 MG tablet Take 81 mg by mouth in the morning.    [provider]  Cholecalciferol (VITAMIN D3) 125 MCG (5000 UT) CAPS Take 5,000 Units by mouth daily with breakfast.    [provider]  citalopram  (CELEXA ) 10 MG tablet Take 1 tablet by mouth once daily 06/22/24   Bensimhon, Daniel R, MD  Coenzyme Q10 (COQ10) 200 MG CAPS Take 200 mg by mouth in the morning.    [provider]  Famotidine  (PEPCID  PO) Take 10 mg  by mouth every morning.    [provider]  HYDROcodone -acetaminophen  (NORCO/VICODIN) 5-325 MG tablet Take 1 tablet by mouth every 4 (four) hours as needed. Patient not taking: Reported on 08/07/2024 08/02/23   Armenta Canning, MD  levothyroxine  (SYNTHROID ) 175 MCG tablet Take 0.5-1 tablets (87.5-175 mcg total) by mouth See admin instructions. Take 1 tablet daily every morning except half tablet on Sundays 03/09/23   Von [place], MD  melatonin 5 MG TABS Take 5 mg by mouth at bedtime as needed (for sleep). Patient not taking: Reported on 08/07/2024    [provider]  mexiletine (MEXITIL ) 150 MG capsule Take 1 capsule by mouth twice daily 01/25/24   Bensimhon,  Daniel R, MD  midodrine  (PROAMATINE ) 5 MG tablet TAKE 1 TABLET BY MOUTH TWICE DAILY AS NEEDED TAKE  1  TABLET  (5  MG)  IF  DOPPLER  IS  90  OR  LESS 06/29/24   Bensimhon, Toribio SAUNDERS, MD  pantoprazole  (PROTONIX ) 40 MG tablet Take 1 tablet by mouth once daily 08/28/24   Bensimhon, Daniel R, MD  Probiotic Product (PROBIOTIC PO) Take 1 capsule by mouth in the morning.    [provider]  rosuvastatin  (CRESTOR ) 20 MG tablet Take 1 tablet by mouth once daily 06/08/24   Bensimhon, Daniel R, MD  sotalol  (BETAPACE ) 80 MG tablet Take 1 tablet by mouth once daily 06/22/24   Bensimhon, Toribio SAUNDERS, MD  traZODone  (DESYREL ) 50 MG tablet Take 1 tablet (50 mg total) by mouth at bedtime. Patient not taking: Reported on 08/07/2024 09/27/23   Bensimhon, Toribio SAUNDERS, MD  warfarin (COUMADIN ) 5 MG tablet Take 2.5 mg (1/2 tablet) every Mon, Fri; 5 mg (whole tablet) all other days; or as directed by [facility] Failure [facility] 03/15/24   Bensimhon, Toribio SAUNDERS, MD    Past Medical History: Past Medical History:  Diagnosis Date   Anxiety    Automatic implantable cardioverter-defibrillator in situ    CAD (coronary artery disease)    S/P stenting of LCX in 2004;  s/p Lat MI 2009 with occlusion of the LCX - treated with Promus stenting. Has total occlusion of the RCA.    CHF (congestive heart failure) (HCC)    Hypercholesteremia    Hypertension    Hypothyroidism    ICD (implantable cardiac defibrillator) in place    PROPHYLACTIC      medtronic   Inferior MI [facility]) ? date; 2009   Ischemic cardiomyopathy    a. 10/09 Echo: Sev LV Dysfxn, inf/lat AK, Mod MR.   Liver disease    LVAD (left ventricular assist device) present (HCC)    Osteoarthritis    a. s/p R TKR   Pacemaker    PVC (premature ventricular contraction)    RBBB    Shortness of breath     Past Surgical History: Past Surgical History:  Procedure Laterality Date   CARPAL TUNNEL RELEASE Right 05/29/2016   Procedure: CARPAL TUNNEL RELEASE;  Surgeon: Toribio Silos, MD;  Location[facility] OR;  Service: Orthopedics;  Laterality: Right;   CORONARY ANGIOPLASTY WITH STENT PLACEMENT  2004   CORONARY ANGIOPLASTY WITH STENT PLACEMENT  07/2008   DEFIBRILLATOR  2009   ESOPHAGOGASTRODUODENOSCOPY N/A 09/15/2013   Procedure: ESOPHAGOGASTRODUODENOSCOPY (EGD);  Surgeon: Gwendlyn ONEIDA Buddy, MD;  Location: [facility] ENDOSCOPY;  Service: Endoscopy;  Laterality: N/A;  bedside   HEMATOMA EVACUATION Right 11/06/2013   Procedure: EVACUATION HEMATOMA;  Surgeon: Dorise MARLA Fellers, MD;  Location: [facility] OR;  Service: Cardiothoracic;  Laterality: Right;  ICD GENERATOR CHANGEOUT N/A 06/28/2019   Procedure: ICD GENERATOR CHANGEOUT;  Surgeon: Inocencio Soyla Lunger, MD;  Location: [facility] INVASIVE CV LAB;  Service: Cardiovascular;  Laterality: N/A;   INSERT / REPLACE / REMOVE PACEMAKER  2009   INSERTION OF IMPLANTABLE LEFT VENTRICULAR ASSIST DEVICE N/A 09/18/2013   Procedure: INSERTION OF IMPLANTABLE LEFT VENTRICULAR ASSIST DEVICE;  Surgeon: Maude Fleeta Ochoa, MD;  Location: [facility] OR;  Service: Open Heart Surgery;  Laterality: N/A;  CIRC ARREST  NITRIC OXIDE    INTRA-AORTIC BALLOON PUMP INSERTION N/A 09/14/2013   Procedure: INTRA-AORTIC BALLOON PUMP INSERTION;  Surgeon: Toribio JONELLE Fuel, MD;  Location: [facility] CATH LAB;  Service: Cardiovascular;  Laterality: N/A;   INTRAOPERATIVE TRANSESOPHAGEAL ECHOCARDIOGRAM N/A 09/18/2013   Procedure: INTRAOPERATIVE TRANSESOPHAGEAL ECHOCARDIOGRAM;  Surgeon: Maude Fleeta Ochoa, MD;  Location: [facility] OR;  Service: Open Heart Surgery;  Laterality: N/A;   KNEE ARTHROSCOPY     bilaterally   KNEE ARTHROSCOPY W/ PARTIAL MEDIAL MENISCECTOMY  12/2002   left   LEFT VENTRICULAR ASSIST DEVICE     RIGHT HEART CATH N/A 07/28/2021   Procedure: RIGHT HEART CATH;  Surgeon: Fuel Toribio JONELLE, MD;  Location: [facility] INVASIVE CV LAB;  Service: Cardiovascular;  Laterality: N/A;   RIGHT HEART CATH N/A 02/12/2022   Procedure: RIGHT HEART CATH;  Surgeon: Fuel Toribio JONELLE, MD;  Location: [facility] INVASIVE CV LAB;   Service: Cardiovascular;  Laterality: N/A;   RIGHT HEART CATHETERIZATION N/A 08/25/2013   Procedure: RIGHT HEART CATH;  Surgeon: Toribio JONELLE Fuel, MD;  Location: [facility] CATH LAB;  Service: Cardiovascular;  Laterality: N/A;   RIGHT HEART CATHETERIZATION N/A 09/13/2013   Procedure: RIGHT HEART CATH;  Surgeon: Toribio JONELLE Fuel, MD;  Location: [facility] CATH LAB;  Service: Cardiovascular;  Laterality: N/A;   RIGHT HEART CATHETERIZATION N/A 02/08/2014   Procedure: RIGHT HEART CATH;  Surgeon: Toribio JONELLE Fuel, MD;  Location: [facility] CATH LAB;  Service: Cardiovascular;  Laterality: N/A;   RIGHT HEART CATHETERIZATION N/A 02/12/2014   Procedure: RIGHT HEART CATH;  Surgeon: Toribio JONELLE Fuel, MD;  Location: [facility] CATH LAB;  Service: Cardiovascular;  Laterality: N/A;   TOTAL KNEE ARTHROPLASTY  11/27/11   left   TOTAL KNEE ARTHROPLASTY  11/27/2011   Procedure: TOTAL KNEE ARTHROPLASTY;  Surgeon: LELON JONETTA Shari Mickey., MD;  Location: [facility] OR;  Service: Orthopedics;  Laterality: Left;  left total knee arthroplasty   TOTAL KNEE ARTHROPLASTY Right 12/03/2021   Procedure: TOTAL KNEE ARTHROPLASTY;  Surgeon: Shari Toribio, MD;  Location: [facility] OR;  Service: Orthopedics;  Laterality: Right;   VIDEO ASSISTED THORACOSCOPY Right 11/06/2013   Procedure: VIDEO ASSISTED THORACOSCOPY;  Surgeon: Dorise MARLA Fellers, MD;  Location: [facility] OR;  Service: Cardiothoracic;  Laterality: Right;    Family History: Family History  Problem Relation Age of Onset   Heart failure Father    Stroke Father    Coronary artery disease Mother    Heart disease Mother    Hyperlipidemia Sister     Social History: Social History   Socioeconomic History   Marital status: Married    Spouse name: Not on file   Number of children: 1   Years of education: Not on file   Highest education level: Not on file  Occupational History   Occupation: Building Services Engineer: RETIRED   Occupation: it consultant  Tobacco Use   Smoking status: Never   Smokeless tobacco: Never   Vaping Use   Vaping status: Never Used  Substance and Sexual Activity   Alcohol use: No  Drug use: Not Currently    Types: Marijuana    Comment: back in our younger days we smoked a little pot   Sexual activity: Not Currently  Other Topics Concern   Not on file  Social History Narrative   Not on file   Social Drivers of Health   Tobacco Use: Low Risk (09/28/2023)   Patient History    Smoking Tobacco Use: Never    Smokeless Tobacco Use: Never    Passive Exposure: Not on file  Financial Resource Strain: Not on file  Food Insecurity: No Food Insecurity (12/17/2022)   Hunger Vital Sign    Worried About Running Out of Food in the Last Year: Never true    Ran Out of Food in the Last Year: Never true  Transportation Needs: No Transportation Needs (12/17/2022)   PRAPARE - Administrator, Civil Service (Medical): No    Lack of Transportation (Non-Medical): No  Physical Activity: Not on file  Stress: Not on file  Social Connections: Not on file  Depression (EYV7-0): Not on file  Alcohol Screen: Not on file  Housing: Low Risk (12/17/2022)   Housing    Last Housing Risk Score: 0  Utilities: Not At Risk (12/17/2022)   AHC Utilities    Threatened with loss of utilities: No  Health Literacy: Not on file    Allergies:  Allergies[1]  Objective:   Vital Signs:   Weight:  [73.2 kg] 73.2 kg (01/07 1200)   Filed Weights   09/06/24 1200  Weight: 73.2 kg    Mean arterial Pressure 102   Physical Exam    General:  well appearing.  Cor: Mechanical heart sounds with LVAD hum present. JVD 7-8 cm  Lungs: Clear Abdomen: soft, nontender, nondistended.  Driveline: C/D/I; securement device intact and driveline incorporated Extremities: no edema  Telemetry   NSR 80s, personally reviewed   Labs   Basic Metabolic Panel: No results for input(s): NA, K, CL, CO2, GLUCOSE, BUN, CREATININE, CALCIUM , MG, PHOS in the last 168 hours.  Liver Function  Tests: No results for input(s): AST, ALT, ALKPHOS, BILITOT, PROT, ALBUMIN  in the last 168 hours. No results for input(s): LIPASE, AMYLASE in the last 168 hours. No results for input(s): AMMONIA in the last 168 hours.  CBC: No results for input(s): WBC, NEUTROABS, HGB, HCT, MCV, PLT in the last 168 hours.  Cardiac Enzymes: No results for input(s): CKTOTAL, CKMB, CKMBINDEX, TROPONINI in the last 168 hours.  BNP: BNP (last 3 results) No results for input(s): BNP in the last 8760 hours.  ProBNP (last 3 results) No results for input(s): PROBNP in the last 8760 hours.   CBG: No results for input(s): GLUCAP in the last 168 hours.  Coagulation Studies: No results for input(s): LABPROT, INR in the last 72 hours.  Imaging    US  EKG SITE RITE Result Date: 09/06/2024 If Site Rite image not attached, placement could not be confirmed due to current cardiac rhythm.   Patient Profile:   Johnny Oneill is a 84 y.o. 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   Assessment/Plan:    1) LVAD w/ DL Fault Alarms  - HMII 04/7983 for DT. - He continues to have DL fault alarms. Reviewed with [employer] engineers and these are felt to be getting worse, Recommend attempt at external repair. Plan 1/8  w/ engineers - place PICC for central access  - Remains on ASA and warfarin (HM-2) - check LDH  - check INR. Goal 2.0-2.5. PharmD to dose coumadin .   - DL site ok - MAPs remain elevated in setting of midodrine  but hasn't tolerating lowering in setting of orthostasis.   2) Chronic systolic HF: ICM, EF 20-25% s/p LVAD HM II implant 08/2013 for DT.   - Echo 3/18 EF 10-15% mild AI. Mod RV dysfunction. LVAD cannula OK - has struggled with RV failure however RHC 11/22 looked good (despite evidence of RVF on LE  dopplers showing reflux - NYHA Class I-II.  - Volume status ok  - Failed Jardiance  due to volume depletion - He has not tolerated ACE-I or amlodipine  or b-blockers - Orthostasis managed with daily midodrine    3) HTN complicated by orthostatic hypotension - MAPs remain elevated  but hasn't tolerating lowering in setting of orthostasis  - no change    4) VT/Frequent PVCs - s/p ICD gen change.  - Off amio due to cerebellar side effects.  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 - VT 06/16/21 with  DC-CV x 1 - Recurrent VT 12/28/21 after stopping sotalol . Sotalol  restarted. - PVCs improved on mexilitene.  - No recent VT - Continue Sotalol  and mexiletine, watch sotalol  dosing w/ CKD  - Keep K > 4.0 Mg > 2.0    5) Atrial flutter, paroxysmal - patient paced out of AFL in 6/22. Atrial therapies enabled  - continue sotalol  (on for VT). QTs slightly prolonged but have been stable - in NSR on tele  - on warfarin   6) CAD - denies CP  - continue Crestor    7) CKD IV - Creatinine baseline 1.8-2.4 - check BMP, follow daily  - failed Jardiance  - Follows with Dr. Gearline (Renal)   8) Chronic gout - Following with Rheum - Improved on allopurinol   I reviewed the LVAD parameters from today, and compared the results to the patient's prior recorded data.  No programming changes were made.  The LVAD is functioning within specified parameters.  The patient performs LVAD self-test daily.  LVAD interrogation was negative for any significant power changes, alarms or PI events/speed drops.  LVAD equipment check completed and is in good working order.  Back-up equipment present.   LVAD education done on emergency procedures and precautions and reviewed exit site care.  Length of Stay: 0  Caffie Marcine RIGGERS 09/06/2024, 12:11 PM   VAD Team Pager [phone] (7am - 7am) +++VAD ISSUES ONLY+++  Advanced Heart Failure Team Pager [phone] (M-F; 7a - 5p)   Please visit [email or web address]: For overnight coverage please call  cardiology fellow first. If fellow not available call Shock/ECMO MD on call.  For ECMO / Mechanical Support (Impella, IABP, LVAD) issues call Shock / ECMO MD on call.    Patient seen with PA, I formulated the plan and agree with the above note.   Patient has been symptomatically stable recently, no dyspnea with usual activities.  However, he has noted 4 driveline fault alarms today.  No pump stops.    General: Well appearing this am. NAD.  HEENT: Normal. Neck: Supple, JVP 7-8 cm. Carotids OK.  Cardiac:  Mechanical heart sounds with LVAD hum present.  Lungs:  CTAB, normal effort.  Abdomen:  NT, mild distention, no HSM. No bruits or masses. +BS  LVAD exit site: Well-healed and incorporated. Dressing dry and intact. No erythema or drainage. Stabilization device present and accurately applied. Driveline dressing changed daily per sterile  technique. Extremities:  Warm and dry. No cyanosis, clubbing, rash, or edema.  Neuro:  Alert & oriented x 3. Cranial nerves grossly intact. Moves all 4 extremities w/o difficulty. Affect pleasant    Patient with driveline fault alarms, no pump stops.  He will need external driveline repair.  Admitting to CCU, [employer] engineers will be here tomorrow to attempt repair.   Full labs to be sent.   Continue midodrine  despite mildly increased MAP, he becomes significantly lightheaded when he does not take this.   Ezra Shuck 09/06/2024 2:29 PM     [1]  Allergies Allergen Reactions   Ace Inhibitors Other (See Comments)    Hypotension and elevated creatinine   Amiodarone  Other (See Comments)    Cerebellar side effects   Diovan [Valsartan] Other (See Comments)    Hypotension at low dose, hyperkalemia   Lipitor [Atorvastatin  Calcium ] Other (See Comments)    Muscle pain   Norvasc  [Amlodipine ] Other (See Comments)    hypotension   Jardiance  [Empagliflozin ] Nausea And Vomiting   "

## 2024-09-06 NOTE — Progress Notes (Signed)
 RUA brachial vein accessed with GBR and able to thread the guidewire but unable to dropped PICC catheter centrally. NO visible R basilic and cephalic veins noted. Unable to place PICC on LUA due to patient has L Pacer. RN aware.

## 2024-09-06 NOTE — Progress Notes (Addendum)
 LVAD Coordinator Rounding Note:  Direct admit 09/07/23 for planned external drive line repair due to worsening drive line fault. Pt placed on ungrounded cable.   Pt has had known drive line faults since 6/73/75. Log files sent to [employer] every two months for close monitoring. The DL phase data sent last visit showed phase A had degraded since the last log file from 06/12/24.The degraded wire in phase A is not completely fractured and it is still functioning but not at 100%. [employer] advised to proceed with external drive line repair.   Drive theme park manager planned for tomorrow at 10:00 am with [employer]. VAD Coordinator will be present for the repair. PICC line ordered by AHF team.  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 Obadiah under bridge to transplant criteria. Patient was removed from transplant list 05/2016 per his choice. He is currently Destination Therapy LVAD.   Pt sitting in bed on my arrival. Wife Johnny Oneill at bedside. No complaints at this time.  Vital signs       Temp: 98.3 HR: 80 Doppler Pressure: 111    Automatic BP: 125/100 (109)   O2 Sat: % on RA Wt: 161.4 lbs  LVAD interrogation reveals:  Speed: 9800    Flow: 3.9   Power: 5.6 w   PI: 6.0     Alarms: 5 drive line faults Events: none  Fixed speed:  9800 Low speed limit: 9200  Drive Line:  Drive line is being maintained weekly by Johnny Oneill, his wife. Existing VAD dressing clean, dry, and intact. Drive line anchor correctly applied. Weekly dressing changes per [facility] nurse or caregiver. Next dressing change due   Labs:  LDH trend:   INR trend:   Anticoagulation Plan: -INR Goal:  2.5 - 3.0  -ASA Dose: 81 mg daily  Blood Products:    Device: Medtronic dual - Therapies: on VF 231 bpm; AT 171bpm - Pacing:  AAI<=>DDD 80 bpm  Plan/Recommendations:  Call VAD Coordinator with any VAD equipment or drive line issues. Weekly dressing changes per [facility] nurse or caregiver.  VAD Coordinator will be present for external drive line  repair.  Schuyler Lunger RN, BSN VAD Coordinator 24/7 Pager [phone]

## 2024-09-06 NOTE — Progress Notes (Addendum)
 ANTICOAGULATION CONSULT NOTE  Pharmacy Consult for warfarin Indication: HMII 2015  Allergies[1]  Patient Measurements: Height: 5' 9 (175.3 cm) Weight: 73.2 kg (161 lb 6 oz) IBW/kg (Calculated) : 70.7  Vital Signs: BP: 118/95 (01/07 1211) Pulse Rate: 80 (01/07 1215)  Labs: No results for input(s): HGB, HCT, PLT, APTT, LABPROT, INR, HEPARINUNFRC, HEPRLOWMOCWT, CREATININE, CKTOTAL, CKMB, TROPONINIHS in the last 72 hours.  CrCl cannot be calculated (Patient's most recent lab result is older than the maximum 21 days allowed.).  Medical History: Past Medical History:  Diagnosis Date   Anxiety    Automatic implantable cardioverter-defibrillator in situ    CAD (coronary artery disease)    S/P stenting of LCX in 2004;  s/p Lat MI 2009 with occlusion of the LCX - treated with Promus stenting. Has total occlusion of the RCA.    CHF (congestive heart failure) (HCC)    Hypercholesteremia    Hypertension    Hypothyroidism    ICD (implantable cardiac defibrillator) in place    PROPHYLACTIC      medtronic   Inferior MI [facility]) ? date; 2009   Ischemic cardiomyopathy    a. 10/09 Echo: Sev LV Dysfxn, inf/lat AK, Mod MR.   Liver disease    LVAD (left ventricular assist device) present (HCC)    Osteoarthritis    a. s/p R TKR   Pacemaker    PVC (premature ventricular contraction)    RBBB    Shortness of breath     Medications:  See MAR  Assessment: 56 yoM s/p HM2 2015 admitted for driveline repair 1/8.  Pharmacy consulted for warfarin dosing.  PTA warfarin regimen - 2.5 mg Mondays and Fridays, 5 mg all other days  Last INR 3.0 therapeutic at 12/29 anticoag visit.  Labs pending today.   Goal of Therapy:  INR 2-3 Monitor platelets by anticoagulation protocol: Yes   Plan:  Restart PTA regimen - warfarin 5 mg PO x1 tonight Daily INR, CBC, LDH Monitor for s/sx of bleeding  Maurilio Fila, PharmD Clinical Pharmacist 09/06/2024  2:35 PM     [1]   Allergies Allergen Reactions   Ace Inhibitors Other (See Comments)    Hypotension and elevated creatinine   Amiodarone  Other (See Comments)    Cerebellar side effects   Diovan [Valsartan] Other (See Comments)    Hypotension at low dose, hyperkalemia   Lipitor [Atorvastatin  Calcium ] Other (See Comments)    Muscle pain   Norvasc  [Amlodipine ] Other (See Comments)    hypotension   Jardiance  [Empagliflozin ] Nausea And Vomiting

## 2024-09-07 ENCOUNTER — Other Ambulatory Visit (HOSPITAL_COMMUNITY): Payer: Self-pay | Admitting: Internal Medicine

## 2024-09-07 DIAGNOSIS — Z95811 Presence of heart assist device: Secondary | ICD-10-CM | POA: Diagnosis not present

## 2024-09-07 DIAGNOSIS — I5043 Acute on chronic combined systolic (congestive) and diastolic (congestive) heart failure: Secondary | ICD-10-CM

## 2024-09-07 DIAGNOSIS — E785 Hyperlipidemia, unspecified: Secondary | ICD-10-CM

## 2024-09-07 DIAGNOSIS — T829XXD Unspecified complication of cardiac and vascular prosthetic device, implant and graft, subsequent encounter: Secondary | ICD-10-CM | POA: Diagnosis not present

## 2024-09-07 DIAGNOSIS — T82598A Other mechanical complication of other cardiac and vascular devices and implants, initial encounter: Secondary | ICD-10-CM | POA: Diagnosis not present

## 2024-09-07 DIAGNOSIS — I5022 Chronic systolic (congestive) heart failure: Secondary | ICD-10-CM | POA: Diagnosis not present

## 2024-09-07 LAB — BASIC METABOLIC PANEL WITH GFR
Anion gap: 9 (ref 5–15)
BUN: 36 mg/dL — ABNORMAL HIGH (ref 8–23)
CO2: 24 mmol/L (ref 22–32)
Calcium: 9.7 mg/dL (ref 8.9–10.3)
Chloride: 104 mmol/L (ref 98–111)
Creatinine, Ser: 2.02 mg/dL — ABNORMAL HIGH (ref 0.61–1.24)
GFR, Estimated: 33 mL/min — ABNORMAL LOW
Glucose, Bld: 104 mg/dL — ABNORMAL HIGH (ref 70–99)
Potassium: 4.6 mmol/L (ref 3.5–5.1)
Sodium: 136 mmol/L (ref 135–145)

## 2024-09-07 LAB — CBC
HCT: 39.5 % (ref 39.0–52.0)
Hemoglobin: 12.8 g/dL — ABNORMAL LOW (ref 13.0–17.0)
MCH: 29.8 pg (ref 26.0–34.0)
MCHC: 32.4 g/dL (ref 30.0–36.0)
MCV: 92.1 fL (ref 80.0–100.0)
Platelets: 150 K/uL (ref 150–400)
RBC: 4.29 MIL/uL (ref 4.22–5.81)
RDW: 16.5 % — ABNORMAL HIGH (ref 11.5–15.5)
WBC: 5.7 K/uL (ref 4.0–10.5)
nRBC: 0 % (ref 0.0–0.2)

## 2024-09-07 LAB — PROTIME-INR
INR: 3.1 — ABNORMAL HIGH (ref 0.8–1.2)
Prothrombin Time: 33.3 s — ABNORMAL HIGH (ref 11.4–15.2)

## 2024-09-07 LAB — LACTATE DEHYDROGENASE: LDH: 343 U/L — ABNORMAL HIGH (ref 105–235)

## 2024-09-07 MED ORDER — EPINEPHRINE 1 MG/10ML IV SOSY
PREFILLED_SYRINGE | INTRAVENOUS | Status: AC
  Filled 2024-09-07: qty 10

## 2024-09-07 MED ORDER — WARFARIN SODIUM 5 MG PO TABS
5.0000 mg | ORAL_TABLET | Freq: Once | ORAL | Status: AC
  Administered 2024-09-07: 5 mg via ORAL
  Filled 2024-09-07: qty 1

## 2024-09-07 NOTE — Progress Notes (Signed)
 LVAD Coordinator Rounding Note:  Direct admit 09/07/23 for planned external drive line repair due to worsening drive line fault. Pt placed on ungrounded cable.   Pt has had known drive line faults since 6/73/75. Log files sent to [employer] every two months for close monitoring. The DL phase data sent last visit showed phase A had degraded since the last log file from 06/12/24.The degraded wire in phase A is not completely fractured and it is still functioning but not at 100%. [employer] advised to proceed with external drive line repair.   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 Obadiah under bridge to transplant criteria. Patient was removed from transplant list 05/2016 per his choice. He is currently Destination Therapy LVAD.   Pt sitting in bed on my arrival. Wife Dagoberto at bedside. No complaints at this time.External perc lead repair performed this morning by [employer] engineers without complication. VAD parameters remained stable throughout procedure. Post perc lead repair instructions given to pt and his wife. Pt to remain in ICU overnight for observation.   Vital signs       Temp: 97.9 HR: 82 Doppler Pressure: 111    Automatic BP: 123/98 (107)  O2 Sat: 96% on RA Wt: 161.4>160.9 lbs  LVAD interrogation reveals:  Speed: 9800    Flow: 4.2   Power: 5.7 w   PI: 6.3    Alarms: 5 drive line faults prior to repair; none post repair. Events: none  Fixed speed:  9800 Low speed limit: 9200  Drive Line:  Drive line is being maintained weekly by Dagoberto, his wife. Existing VAD dressing clean, dry, and intact. Drive line anchor correctly applied. Weekly dressing changes per [facility] nurse or caregiver. Next dressing change due   Labs:  LDH trend: 334>343  INR trend: 3.1>3.1  Anticoagulation Plan: -INR Goal:  2.5 - 3.0  -ASA Dose: 81 mg daily  Blood Products:    Device: Medtronic dual - Therapies: on VF 231 bpm; AT 171bpm - Pacing:  AAI<=>DDD 80 bpm  Plan/Recommendations:  Call VAD Coordinator with  any VAD equipment or drive line issues. Weekly dressing changes per [facility] nurse or caregiver.    Schuyler Lunger RN, BSN VAD Coordinator 24/7 Pager [phone]

## 2024-09-07 NOTE — Progress Notes (Addendum)
 "  Advanced Heart Failure VAD Team Note  HF Cardiologist: Dr. Cherrie  Chief Complaint: Complication of LVAD, DL Fault Alarms  Patient Profile   Johnny Oneill is a 84 y.o. 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   Significant events:    Numerous fault alarms overnight but no pump stops   Subjective:    Feels well today. No complaints. Denies CP. No dyspnea.   LVAD INTERROGATION:  HeartMate II LVAD:   Flow 4.1 liters/min, speed 9800, power 5.6, PI 6.1.  Numerous fault alarms. No pump stops   Objective:   Vital Signs:   Temp:  [97.8 F (36.6 C)-99.2 F (37.3 C)] 97.8 F (36.6 C) (01/08 0400) Pulse Rate:  [79-84] 81 (01/08 0400) Resp:  [12-22] 13 (01/08 0400) BP: (110-118)/(80-102) 110/80 (01/08 0405) SpO2:  [95 %-100 %] 97 % (01/08 0400) Weight:  [73.2 kg] 73.2 kg (01/07 1200) Last BM Date : 09/06/24 Mean arterial Pressure 110  Intake/Output:   Intake/Output Summary (Last 24 hours) at 09/07/2024 0731 Last data filed at 09/07/2024 0453 Gross per 24 hour  Intake --  Output 825 ml  Net -825 ml    Physical Exam   General:  well appearing.  Cor: Mechanical heart sounds with LVAD hum present. JVD 8 cm.  Lungs: Clear Abdomen: soft, nontender, nondistended.  Driveline: C/D/I; securement device intact and driveline incorporated Extremities: no edema  Telemetry   A-V paced 70s, personally reviewed   Labs  Basic Metabolic Panel: Recent Labs  Lab 09/06/24 2011  NA 136  K 4.2  CL 104  CO2 20*  GLUCOSE 113*  BUN 36*  CREATININE 2.11*  CALCIUM  9.0  MG 2.0    Liver Function Tests: Recent Labs  Lab 09/06/24 2011  AST 28  ALT 15  ALKPHOS 106  BILITOT 0.4  PROT 6.5  ALBUMIN  3.9   No results for input(s): LIPASE, AMYLASE in the last 168 hours. No results for input(s): AMMONIA in the last 168  hours.  CBC: Recent Labs  Lab 09/06/24 2011  WBC 5.4  NEUTROABS 3.3  HGB 12.2*  HCT 37.4*  MCV 93.0  PLT 160    INR: Recent Labs  Lab 09/06/24 2011  INR 3.1*    Medications:    Scheduled Medications:  allopurinol   150 mg Oral Daily   aspirin  EC  81 mg Oral q AM   Chlorhexidine  Gluconate Cloth  6 each Topical Daily   citalopram   10 mg Oral Daily   famotidine   10 mg Oral Daily   levothyroxine   175 mcg Oral Once per day on Monday Tuesday Wednesday Thursday Friday Saturday   And   [START ON 09/10/2024] levothyroxine   87.5 mcg Oral Every Sunday   melatonin  3 mg Oral QHS   mexiletine  150 mg Oral BID   midodrine   5 mg Oral BID WC   pantoprazole   40 mg Oral Daily   rosuvastatin   20 mg Oral Daily   sodium chloride  flush  10-40 mL Intracatheter Q12H   sotalol   80 mg Oral Daily   Warfarin - Pharmacist Dosing Inpatient   Does not apply q1600    Infusions:   PRN Medications: acetaminophen , mouth rinse, sodium chloride  flush  Assessment/Plan:     1) LVAD w/ DL Fault Alarms  - HMII 04/7983 for DT. - He continues to  have DL fault alarms. Reviewed with [employer] engineers and these are felt to be getting worse, Recommend attempt at external repair. Plan repair today w/ [employer] engineers  - Remains on ASA and warfarin (HM-2) - LDH pending  - INR 3.1 on admit, repeat pending. Goal 2.0-2.5. Dosing per pharmD  - DL site ok - MAPs remain elevated in setting of midodrine  but hasn't tolerating lowering in setting of orthostasis.    2) Chronic systolic HF: ICM, EF 20-25% s/p LVAD HM II implant 08/2013 for DT.   - Echo 3/18 EF 10-15% mild AI. Mod RV dysfunction. 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  - Off amio due to cerebellar side effects.  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 - VT 06/16/21 with  DC-CV x 1 - Recurrent VT 12/28/21 after stopping sotalol . Sotalol  restarted. - PVCs improved on mexilitene.  - No recent VT - Continue Sotalol  and mexiletine, watch sotalol  dosing w/ CKD  - Keep K > 4.0 Mg > 2.0    5) Atrial flutter, paroxysmal - patient paced out of AFL in 6/22. Atrial therapies enabled  - continue sotalol  (on for VT). QTs slightly prolonged but have been stable - in NSR on tele  - on warfarin   6) CAD - denies CP  - continue Crestor    7) CKD IV - Creatinine baseline 1.8-2.4 - Stable, 2.1 on admit. Follow BMP daily  - failed Jardiance  - Follows with Dr. Gearline (Renal)   8) Chronic gout - Following with Rheum - Improved on allopurinol    Plan DL repair w/ [employer] engineers today. IV team unable to obtain PICC access. Will ensure patient has two well working PIVs prior to drive line repair and have emergency supplies at bedside at the time of repair.    I reviewed the LVAD parameters from today, and compared the results to the patient's prior recorded data.  No programming changes were made.  The LVAD is functioning within specified parameters.  The patient performs LVAD self-test daily.  LVAD interrogation was negative for any significant power changes, alarms or PI events/speed drops.  LVAD equipment check completed and is in good working order.  Back-up equipment present.   LVAD education done on emergency procedures and precautions and reviewed exit site care.  Length of Stay: 0  Johnny Simmons, PA-C 09/07/2024, 7:31 AM  VAD Team --- VAD ISSUES ONLY--- Pager [phone] (7am - 7am)  Advanced Heart Failure Team  Pager [phone] (M-F; 7a - 5p)   Please visit [email or web address]: For overnight  coverage please call cardiology fellow first. If fellow not available call Shock/ECMO MD on call.  For ECMO / Mechanical Support (Impella, IABP, LVAD) issues call Shock / ECMO MD on call.    Patient seen with PA, I formulated the plan and agree with the above note.   Creatinine stable at 2.02, MAP stable 100s.  INR 3.1.   Patient had additional driveline fault alarms overnight, no pump stops.   General: Well appearing this am. NAD.  HEENT:  Normal. Neck: Supple, JVP 7-8 cm. Carotids OK.  Cardiac:  Mechanical heart sounds with LVAD hum present.  Lungs:  CTAB, normal effort.  Abdomen:  NT, ND, no HSM. No bruits or masses. +BS  LVAD exit site: Well-healed and incorporated. Dressing dry and intact. No erythema or drainage. Stabilization device present and accurately applied. Driveline dressing changed daily per sterile technique. Extremities:  Warm and dry. No cyanosis, clubbing, rash, or edema.  Neuro:  Alert & oriented x 3. Cranial nerves grossly intact. Moves all 4 extremities w/o difficulty. Affect pleasant    MAP 100s but accepting this given profound orthostasis in past.   Currently having his driveline repaired by Johnny Oneill.  Will watch overnight after repair to make sure no further alarms.   Johnny Oneill 09/07/2024 11:02 AM  "

## 2024-09-07 NOTE — Plan of Care (Signed)
  Problem: Education: Goal: Patient will understand all VAD equipment and how it functions Outcome: Progressing Goal: Patient will be able to verbalize current INR target range and antiplatelet therapy for discharge home Outcome: Progressing   Problem: Cardiac: Goal: LVAD will function as expected and patient will experience no clinical alarms Outcome: Progressing   Problem: Education: Goal: Knowledge of General Education information will improve Description: Including pain rating scale, medication(s)/side effects and non-pharmacologic comfort measures Outcome: Progressing   Problem: Health Behavior/Discharge Planning: Goal: Ability to manage health-related needs will improve Outcome: Progressing   Problem: Clinical Measurements: Goal: Ability to maintain clinical measurements within normal limits will improve Outcome: Progressing Goal: Will remain free from infection Outcome: Progressing Goal: Diagnostic test results will improve Outcome: Progressing Goal: Respiratory complications will improve Outcome: Progressing Goal: Cardiovascular complication will be avoided Outcome: Progressing   Problem: Activity: Goal: Risk for activity intolerance will decrease Outcome: Progressing   Problem: Nutrition: Goal: Adequate nutrition will be maintained Outcome: Progressing   Problem: Coping: Goal: Level of anxiety will decrease Outcome: Progressing   Problem: Elimination: Goal: Will not experience complications related to bowel motility Outcome: Progressing Goal: Will not experience complications related to urinary retention Outcome: Progressing   Problem: Pain Managment: Goal: General experience of comfort will improve and/or be controlled Outcome: Progressing   Problem: Safety: Goal: Ability to remain free from injury will improve Outcome: Progressing   Problem: Skin Integrity: Goal: Risk for impaired skin integrity will decrease Outcome: Progressing

## 2024-09-07 NOTE — Progress Notes (Signed)
 ANTICOAGULATION CONSULT NOTE  Pharmacy Consult for warfarin Indication: HMII 2015  Allergies[1]  Patient Measurements: Height: 5' 7 (170.2 cm) Weight: 73 kg (160 lb 15 oz) IBW/kg (Calculated) : 66.1  Vital Signs: Temp: 98.1 F (36.7 C) (01/08 1135) Temp Source: Oral (01/08 1135) BP: 110/80 (01/08 0405) Pulse Rate: 104 (01/08 0700)  Labs: Recent Labs    09/06/24 2011 09/07/24 0917  HGB 12.2* 12.8*  HCT 37.4* 39.5  PLT 160 150  LABPROT 33.2* 33.3*  INR 3.1* 3.1*  CREATININE 2.11* 2.02*    Estimated Creatinine Clearance: 26.8 mL/min (A) (by C-G formula based on SCr of 2.02 mg/dL (H)).  Medical History: Past Medical History:  Diagnosis Date   Anxiety    Automatic implantable cardioverter-defibrillator in situ    CAD (coronary artery disease)    S/P stenting of LCX in 2004;  s/p Lat MI 2009 with occlusion of the LCX - treated with Promus stenting. Has total occlusion of the RCA.    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 date; 2009   Ischemic cardiomyopathy    a. 10/09 Echo: Sev LV Dysfxn, inf/lat AK, Mod MR.   Liver disease    LVAD (left ventricular assist device) present (HCC)    Osteoarthritis    a. s/p R TKR   Pacemaker    PVC (premature ventricular contraction)    RBBB    Shortness of breath     Medications:  See MAR  Assessment: 55 yoM s/p HM2 2015 admitted for driveline repair 1/8.  Pharmacy consulted for warfarin dosing.  PTA warfarin regimen - 2.5 mg Mondays and Fridays, 5 mg all other days  INR 3.1 today, LDH 343 stable.  Previously therapeutic on his home regimen. Plan to continue this today and anticipate discharge tomorrow s/p 24h observation after lead repair today.   Goal of Therapy:  INR 2-3 Monitor platelets by anticoagulation protocol: Yes   Plan:  Continue PTA regimen -  warfarin 5 mg PO x1 tonight Daily INR, CBC, LDH Monitor for s/sx of bleeding  Maurilio Fila, PharmD Clinical Pharmacist 09/07/2024  12:52 PM      [1]  Allergies Allergen Reactions   Ace Inhibitors Other (See Comments)    Hypotension and elevated creatinine   Amiodarone  Other (See Comments)    Cerebellar side effects   Diovan [Valsartan] Other (See Comments)    Hypotension at low dose, hyperkalemia   Lipitor [Atorvastatin  Calcium ] Other (See Comments)    Muscle pain   Norvasc  [Amlodipine ] Other (See Comments)    hypotension   Jardiance  [Empagliflozin ] Nausea And Vomiting

## 2024-09-08 ENCOUNTER — Ambulatory Visit: Payer: Self-pay

## 2024-09-08 DIAGNOSIS — T829XXD Unspecified complication of cardiac and vascular prosthetic device, implant and graft, subsequent encounter: Secondary | ICD-10-CM | POA: Diagnosis not present

## 2024-09-08 DIAGNOSIS — Z95811 Presence of heart assist device: Secondary | ICD-10-CM | POA: Diagnosis not present

## 2024-09-08 DIAGNOSIS — T82598A Other mechanical complication of other cardiac and vascular devices and implants, initial encounter: Secondary | ICD-10-CM | POA: Diagnosis not present

## 2024-09-08 DIAGNOSIS — I5022 Chronic systolic (congestive) heart failure: Secondary | ICD-10-CM | POA: Diagnosis not present

## 2024-09-08 LAB — POCT I-STAT 7, (LYTES, BLD GAS, ICA,H+H)
Acid-Base Excess: 9 mmol/L — ABNORMAL HIGH (ref 0.0–2.0)
Bicarbonate: 31.5 mmol/L — ABNORMAL HIGH (ref 20.0–28.0)
Calcium, Ion: 1.1 mmol/L — ABNORMAL LOW (ref 1.15–1.40)
HCT: 26 % — ABNORMAL LOW (ref 39.0–52.0)
Hemoglobin: 8.8 g/dL — ABNORMAL LOW (ref 13.0–17.0)
O2 Saturation: 96 %
Patient temperature: 97.8
Potassium: 2.8 mmol/L — ABNORMAL LOW (ref 3.5–5.1)
Sodium: 128 mmol/L — ABNORMAL LOW (ref 135–145)
TCO2: 33 mmol/L — ABNORMAL HIGH (ref 22–32)
pCO2 arterial: 35.4 mmHg (ref 32–48)
pH, Arterial: 7.556 — ABNORMAL HIGH (ref 7.35–7.45)
pO2, Arterial: 69 mmHg — ABNORMAL LOW (ref 83–108)

## 2024-09-08 LAB — BASIC METABOLIC PANEL WITH GFR
Anion gap: 11 (ref 5–15)
BUN: 32 mg/dL — ABNORMAL HIGH (ref 8–23)
CO2: 22 mmol/L (ref 22–32)
Calcium: 9.4 mg/dL (ref 8.9–10.3)
Chloride: 105 mmol/L (ref 98–111)
Creatinine, Ser: 1.91 mg/dL — ABNORMAL HIGH (ref 0.61–1.24)
GFR, Estimated: 35 mL/min — ABNORMAL LOW
Glucose, Bld: 102 mg/dL — ABNORMAL HIGH (ref 70–99)
Potassium: 4 mmol/L (ref 3.5–5.1)
Sodium: 138 mmol/L (ref 135–145)

## 2024-09-08 LAB — PROTIME-INR
INR: 3 — ABNORMAL HIGH (ref 0.8–1.2)
Prothrombin Time: 32.5 s — ABNORMAL HIGH (ref 11.4–15.2)

## 2024-09-08 LAB — CBC
HCT: 41.7 % (ref 39.0–52.0)
Hemoglobin: 13.5 g/dL (ref 13.0–17.0)
MCH: 29.9 pg (ref 26.0–34.0)
MCHC: 32.4 g/dL (ref 30.0–36.0)
MCV: 92.3 fL (ref 80.0–100.0)
Platelets: 139 K/uL — ABNORMAL LOW (ref 150–400)
RBC: 4.52 MIL/uL (ref 4.22–5.81)
RDW: 16.5 % — ABNORMAL HIGH (ref 11.5–15.5)
WBC: 6.5 K/uL (ref 4.0–10.5)
nRBC: 0 % (ref 0.0–0.2)

## 2024-09-08 LAB — LACTATE DEHYDROGENASE: LDH: 336 U/L — ABNORMAL HIGH (ref 105–235)

## 2024-09-08 MED ORDER — WARFARIN SODIUM 2.5 MG PO TABS: 2.5000 mg | ORAL_TABLET | Freq: Once | ORAL | Status: DC

## 2024-09-08 MED ORDER — MIDODRINE HCL 5 MG PO TABS: 5.0000 mg | ORAL_TABLET | Freq: Two times a day (BID) | ORAL | Status: AC

## 2024-09-08 NOTE — TOC Initial Note (Signed)
 Transition of Care [facility]) - Initial/Assessment Note    Patient Details  Name: Johnny Oneill MRN: [id number] Date of Birth: [birth year]  [facility] CM/SW Contact:    Johnny Oneill Phone Number: [phone] 09/08/2024, 12:04 PM  Clinical Narrative:   HF CSW met with patient at bedside. Patient stated that he lives with his spouse. Patient stated that he has no history of HH services. Patient stated that he does not use any equipment. Patient stated that he has a scale at home. Patient stated that he does not have a PCP. CSW explained that a hospital follow up appointment is typically scheduled closer towards dc. Patient is agreeable to establishing care with a provider and scheduling a follow up appointment.   HF CSW/CM will continue to follow and monitor for dc readiness.                   Patient Goals and CMS Choice            Expected Discharge Plan and Services         Expected Discharge Date: 09/08/24                                    Prior Living Arrangements/Services                       Activities of Daily Living   ADL Screening (condition at time of admission) Independently performs ADLs?: Yes (appropriate for developmental age) Is the patient deaf or have difficulty hearing?: No Does the patient have difficulty seeing, even when wearing glasses/contacts?: No Does the patient have difficulty concentrating, remembering, or making decisions?: Yes  Permission Sought/Granted                  Emotional Assessment              Admission diagnosis:  Complication involving left ventricular assist device (LVAD) [T82.9XXA] Patient Active Problem List   Diagnosis Date Noted   Complication involving left ventricular assist device (LVAD) 09/06/2024   Malnutrition of moderate degree 02/10/2022   Acute on chronic right-sided heart failure (HCC) 02/09/2022   Syncope and collapse 12/18/2021   CKD (chronic kidney  disease), stage III (HCC) 06/16/2021   Chronic systolic CHF (congestive heart failure) (HCC) 01/02/2015   Decreased appetite 11/01/2014   Contact dermatitis 09/11/2014   Inguinal hernia 08/21/2014   Hemothorax 02/08/2014   HTN (hypertension) 01/30/2014   Chronic anticoagulation 01/08/2014   Hypothyroid 12/20/2013   Syncope 11/28/2013   Hemothorax on right 11/06/2013   Poor venous access 11/02/2013   LVAD (left ventricular assist device) present (HCC) 09/18/2013   Weakness generalized 09/15/2013   Nonspecific (abnormal) findings on radiological and other examination of gastrointestinal tract 09/14/2013   Anemia, unspecified 09/14/2013   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   PVC (premature ventricular contraction) 12/08/2012   VT (ventricular tachycardia) 12/08/2012   Essential hypertension, benign 11/12/2010   ISCHEMIC CARDIOMYOPATHY 11/12/2010   Chronic systolic heart failure (HCC) 11/12/2010   Ischemic cardiomyopathy    Myocardial infarction of lateral wall (HCC)    Hypercholesteremia    Automatic implantable cardioverter-defibrillator in situ    CAD (coronary artery disease)    Osteoarthritis    PCP:  Pcp, No Pharmacy:   [id number] - [place], [place] - [address] [place] [address] Phone:  [phone] Fax: [phone]     Social Drivers of Health (SDOH) Social History: SDOH Screenings   Food Insecurity: No Food Insecurity (09/06/2024)  Housing: Low Risk (09/06/2024)  Transportation Needs: No Transportation Needs (09/06/2024)  Utilities: Not At Risk (09/06/2024)  Social Connections: Socially Integrated (09/06/2024)  Tobacco Use: Low Risk (09/06/2024)   SDOH Interventions:     Readmission Risk Interventions     No data to display

## 2024-09-08 NOTE — Progress Notes (Signed)
 LVAD Coordinator Rounding Note:  Direct admit 09/07/23 for planned external drive line repair due to worsening drive line fault. Pt placed on ungrounded cable.   Pt has had known drive line faults since 6/73/75. Log files sent to [employer] every two months for close monitoring. The DL phase data sent last visit showed phase A had degraded since the last log file from 06/12/24.The degraded wire in phase A is not completely fractured and it is still functioning but not at 100%. [employer] advised to proceed with external drive line repair.   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 Obadiah under bridge to transplant criteria. Patient was removed from transplant list 05/2016 per his choice. He is currently Destination Therapy LVAD.   Pt lying in bed on my arrival. Perc lead repair performed yesterday by [employer]. Drive line faults reoccurred yesterday at 1422. Log files sent the [employer]. See response below:  The log file confirms DL faults post the external DL replacement. Phase A has degraded further since 08/07/24 but does not appear to be completely fractured.   The pump settings, operating ranges & event history are listed below. The pump parameters trending can be reviewed in the graph below.  Date Phase A Phase B Phase C  06/12/2024 84 80 79  08/07/2024 104 78 76  09/07/2024 121 76 78   Recommendations made by [employer] for continued surveillance log files to be sent at each follow up. Pt already uses an ungrounded cable at home.  Plan for discharge today with follow up in February with Dr. Bensimhon as planned.   Vital signs       Temp: 97.7 HR: 80 Doppler Pressure: 110    Automatic BP: 116/101 (107)  O2 Sat: 97% on RA Wt: 161.4>160.9>160 lbs  LVAD interrogation reveals:  Speed: 9800    Flow: 4.2   Power: 5.7 w   PI: 6.3    Alarms: 5 drive line faults prior to repair; none post repair. Events: none  Fixed speed:  9800 Low speed limit: 9200  Drive Line:  Drive line is being maintained  weekly by Dagoberto, his wife. Existing VAD dressing clean, dry, and intact. Drive line anchor correctly applied. Weekly dressing changes per [facility] nurse or caregiver. Next dressing change due   Labs:  LDH trend: 334>343  INR trend: 3.1>3.1  Anticoagulation Plan: -INR Goal:  2.5 - 3.0  -ASA Dose: 81 mg daily  Blood Products:    Device: Medtronic dual - Therapies: on VF 231 bpm; AT 171bpm - Pacing:  AAI<=>DDD 80 bpm  Plan/Recommendations:  Call VAD Coordinator with any VAD equipment or drive line issues. Weekly dressing changes per [facility] nurse or caregiver.    Schuyler Lunger RN, BSN VAD Coordinator 24/7 Pager [phone]

## 2024-09-08 NOTE — Progress Notes (Signed)
 ANTICOAGULATION CONSULT NOTE  Pharmacy Consult for warfarin Indication: HMII 2015  Allergies[1]  Patient Measurements: Height: 5' 7 (170.2 cm) Weight: 72.6 kg (160 lb) IBW/kg (Calculated) : 66.1  Vital Signs: Temp: 97.9 F (36.6 C) (01/09 1106) Temp Source: Oral (01/09 1106) BP: 116/101 (01/09 0800)  Labs: Recent Labs    09/06/24 2011 09/07/24 0917 09/08/24 0431 09/08/24 0853  HGB 12.2* 12.8* 8.8* 13.5  HCT 37.4* 39.5 26.0* 41.7  PLT 160 150  --  139*  LABPROT 33.2* 33.3*  --  32.5*  INR 3.1* 3.1*  --  3.0*  CREATININE 2.11* 2.02*  --  1.91*    Estimated Creatinine Clearance: 28.4 mL/min (A) (by C-G formula based on SCr of 1.91 mg/dL (H)).  Medical History: Past Medical History:  Diagnosis Date   Anxiety    Automatic implantable cardioverter-defibrillator in situ    CAD (coronary artery disease)    S/P stenting of LCX in 2004;  s/p Lat MI 2009 with occlusion of the LCX - treated with Promus stenting. Has total occlusion of the RCA.    CHF (congestive heart failure) (HCC)    Hypercholesteremia    Hypertension    Hypothyroidism    ICD (implantable cardiac defibrillator) in place    PROPHYLACTIC      medtronic   Inferior MI [facility]) ? date; 2009   Ischemic cardiomyopathy    a. 10/09 Echo: Sev LV Dysfxn, inf/lat AK, Mod MR.   Liver disease    LVAD (left ventricular assist device) present (HCC)    Osteoarthritis    a. s/p R TKR   Pacemaker    PVC (premature ventricular contraction)    RBBB    Shortness of breath     Medications:  See MAR  Assessment: 46 yoM s/p HM2 2015 admitted for driveline repair 1/8.  Pharmacy consulted for warfarin dosing.  PTA warfarin regimen - 2.5 mg Mondays and Fridays, 5 mg all other days  INR came back therapeutic at 3, LDH 336 (stable).  Previously therapeutic on his home regimen. Hgb stable at 13.5, plt 139. No s/sx of bleeding.   Goal of Therapy:  INR 2-3 Monitor platelets by anticoagulation protocol: Yes   Plan:   Continue PTA regimen - will get PTA dose of 2.5 mg tonight as PTA  Daily INR, CBC, LDH Monitor for s/sx of bleeding  Thank you for allowing pharmacy to participate in this patient's care,  Suzen Sour, PharmD, BCCCP Clinical Pharmacist  Phone: [phone] 09/08/2024 11:59 AM  Please check AMION for all [facility] Pharmacy phone numbers After 10:00 PM, call Main Pharmacy [phone]      [1]  Allergies Allergen Reactions   Ace Inhibitors Other (See Comments)    Hypotension and elevated creatinine   Amiodarone  Other (See Comments)    Cerebellar side effects   Diovan [Valsartan] Other (See Comments)    Hypotension at low dose, hyperkalemia   Lipitor [Atorvastatin  Calcium ] Other (See Comments)    Muscle pain   Norvasc  [Amlodipine ] Other (See Comments)    hypotension   Jardiance  [Empagliflozin ] Nausea And Vomiting

## 2024-09-08 NOTE — TOC Transition Note (Signed)
 Transition of Care [facility]) - Discharge Note   Patient Details  Name: Johnny Oneill MRN: [id number] Date of Birth: April 19, 1941  [facility]) CM/SW Contact:  [name] Phone Number: [phone] 09/08/2024, 12:26 PM   Clinical Narrative:   Hospital follow up appointment scheduled for 09/11/24 at 9:20 AM. Future appointments at [facility] (transition care closer towards home).           Patient Goals and CMS Choice            Discharge Placement                       Discharge Plan and Services Additional resources added to the After Visit Summary for                                       Social Drivers of Health (SDOH) Interventions SDOH Screenings   Food Insecurity: No Food Insecurity (09/06/2024)  Housing: Low Risk (09/06/2024)  Transportation Needs: No Transportation Needs (09/06/2024)  Utilities: Not At Risk (09/06/2024)  Social Connections: Socially Integrated (09/06/2024)  Tobacco Use: Low Risk (09/06/2024)     Readmission Risk Interventions     No data to display

## 2024-09-08 NOTE — Telephone Encounter (Signed)
 FYI Only or Action Required?: FYI only for provider: appointment scheduled on 09/11/24.  Patient was last seen in primary care on Not established with [facility] PCP.  Called Nurse Triage reporting Appointment.  Triage Disposition: Information or Advice Only Call  Patient/caregiver understands and will follow disposition?: Yes   Spoke with Deandra, Ecologist at [employer]. Pt being discharged today for heart failure and needing to establish care with PCP and schedule hospital f/u appt. Appt scheduled 1/12 at [facility].   Copied from CRM [id number]. Topic: Clinical - Red Word Triage >> Sep 08, 2024 12:09 PM Gustabo D wrote: Arlana with [facility] called to setup appt for hospital fu.  says pt is having heart failure. Reason for Disposition  General information question, no triage required and triager able to answer question  Protocols used: Information Only Call - No Triage-A-AH

## 2024-09-08 NOTE — Progress Notes (Addendum)
 "  Advanced Heart Failure VAD Team Note  HF Cardiologist: Dr. Cherrie  Chief Complaint: Complication of LVAD, DL Fault Alarms Patient Profile   Johnny Oneill is a 84 y.o. 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   Significant events:    1/8: External repair of driveline  Subjective:    No complaints. Feeling well.   LVAD INTERROGATION:  HeartMate II LVAD:   Flow 3.2 liters/min, speed 9800, power 5, PI 4.7.  Persistent fault alarms. No pump stops   Objective:    Vital Signs:   Temp:  [97.7 F (36.5 C)-98.1 F (36.7 C)] 97.7 F (36.5 C) (01/09 0730) Pulse Rate:  [80-123] 80 (01/08 2330) Resp:  [12-22] 12 (01/08 2330) BP: (105-123)/(84-98) 113/97 (01/08 2017) SpO2:  [89 %-98 %] 97 % (01/08 2330) Weight:  [72.6 kg] 72.6 kg (01/09 0602) Last BM Date : 09/07/24  Mean arterial Pressure 100-110  Intake/Output:  Intake/Output Summary (Last 24 hours) at 09/08/2024 0731 Last data filed at 09/08/2024 0730 Gross per 24 hour  Intake 480 ml  Output 850 ml  Net -370 ml    Physical Exam   General: Well appearing. No distress on RA Cardiac: JVP flat. Mechanical heart sounds with LVAD hum present.  Driveline: Dressing C/D/I. No drainage or redness. Anchor in place. Extremities: Warm and dry. No edema. Neuro: Alert and oriented x3. Affect pleasant. Moves all extremities without difficulty.  Telemetry   AV paced 80 (personally reviewed)  Labs   Basic Metabolic Panel: Recent Labs  Lab 09/06/24 2011 09/07/24 0917 09/08/24 0431  NA 136 136 128*  K 4.2 4.6 2.8*  CL 104 104  --   CO2 20* 24  --   GLUCOSE 113* 104*  --   BUN 36* 36*  --   CREATININE 2.11* 2.02*  --   CALCIUM  9.0 9.7  --   MG 2.0  --   --    Liver Function Tests: Recent Labs  Lab 09/06/24 2011  AST 28  ALT 15  ALKPHOS 106  BILITOT 0.4  PROT  6.5  ALBUMIN  3.9   No results for input(s): LIPASE, AMYLASE in the last 168 hours. No results for input(s): AMMONIA in the last 168 hours.  CBC: Recent Labs  Lab 09/06/24 2011 09/07/24 0917 09/08/24 0431  WBC 5.4 5.7  --   NEUTROABS 3.3  --   --   HGB 12.2* 12.8* 8.8*  HCT 37.4* 39.5 26.0*  MCV 93.0 92.1  --   PLT 160 150  --    INR: Recent Labs  Lab 09/06/24 2011 09/07/24 0917  INR 3.1* 3.1*   Medications:    Scheduled Medications:  allopurinol   150 mg Oral Daily   aspirin  EC  81 mg Oral q AM   Chlorhexidine  Gluconate Cloth  6 each Topical Daily   citalopram   10 mg Oral Daily   famotidine   10 mg Oral Daily   levothyroxine   175 mcg Oral Once per day on Monday Tuesday Wednesday Thursday Friday Saturday   And   [START ON 09/10/2024] levothyroxine   87.5 mcg Oral Every Sunday   melatonin  3 mg Oral QHS   mexiletine  150 mg Oral BID   midodrine   5 mg Oral BID WC   pantoprazole   40 mg Oral Daily   rosuvastatin   20 mg Oral Daily  sodium chloride  flush  10-40 mL Intracatheter Q12H   sotalol   80 mg Oral Daily   Warfarin - Pharmacist Dosing Inpatient   Does not apply q1600    Infusions:   PRN Medications: acetaminophen , mouth rinse, sodium chloride  flush  Assessment/Plan:    1) LVAD w/ DL Fault Alarms  - HMII 04/7983 for DT. - DL default alarms since 7975, has gotten worse. - S/P external perc repair with [employer] 09/07/24 - Remains on ASA and warfarin (HM-2) - LDH pending today, has been stable 300s - INR 3.1 on admit, repeat pending. Goal 2.5-3.0. Dosing per pharmD  - DL site ok - MAPs remain elevated in setting of midodrine  but hasn't tolerating lowering in setting of orthostasis.    2) Chronic systolic HF: ICM, EF 20-25% s/p LVAD HM II implant 08/2013 for DT.   - Echo 3/18 EF 10-15% mild AI. Mod RV dysfunction. LVAD cannula OK - has struggled with RV failure however RHC 11/22 looked good (despite evidence of RVF on LE dopplers showing reflux -  NYHA Class I-II. Euvolemic.  - Failed Jardiance  due to volume depletion - He has not tolerated ACE-I or amlodipine  or b-blockers - Orthostasis managed with daily midodrine     3) HTN complicated by orthostatic hypotension - MAPs remain elevated  but hasn't tolerating lowering in setting of orthostasis  - no change    4) VT/Frequent PVCs - s/p ICD gen change.  - Off amio due to cerebellar side effects.  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 - VT 10/22 with  DC-CV x 1 - Recurrent VT 4/23 after stopping sotalol . Sotalol  restarted. - PVCs improved on mexilitene.  - No recent VT - Continue Sotalol  and mexiletine, watch sotalol  dosing w/ CKD  - Keep K > 4.0 Mg > 2.0    5) Atrial flutter, paroxysmal - patient paced out of AFL in 6/22. Atrial therapies enabled  - continue sotalol  (on for VT). QTs slightly prolonged but have been stable - in NSR on tele  - on warfarin   6) CAD - denies CP  - continue Crestor    7) CKD IV - Creatinine baseline 1.8-2.4 - Stable, ~2 on admit. Follow BMP daily  - failed Jardiance  - Follows with Dr. Gearline (Renal)   8) Chronic gout - Following with Rheum - Improved on allopurinol   Anticipate discharge in the next 24 hours.   I reviewed the LVAD parameters from today, and compared the results to the patient's prior recorded data.  No programming changes were made.  The LVAD is functioning within specified parameters.  The patient performs LVAD self-test daily.  LVAD interrogation was negative for any significant power changes, alarms or PI events/speed drops.  LVAD equipment check completed and is in good working order.  Back-up equipment present.   LVAD education done on emergency procedures and precautions and reviewed exit site care.  Length of Stay: 0  Jordan Lee, NP 09/08/2024, 7:31 AM  VAD Team --- VAD ISSUES ONLY--- Pager [phone] (7am - 7am)  Advanced Heart Failure Team  Pager [phone] (M-F; 7a - 5p)   Please visit [email or web address]: For overnight coverage please  call cardiology fellow first. If fellow not available call Shock/ECMO MD on call.  For ECMO / Mechanical Support (Impella, IABP, LVAD) issues call Shock / ECMO MD on call.    Patient seen with NP, I formulated the plan and agree with the above note.   Still with driveline fault alarms after repair.  No pump stops.  Feels good  and wants to go home.   General: Well appearing this am. NAD.  HEENT: Normal. Neck: Supple, JVP 7-8 cm. Carotids OK.  Cardiac:  Mechanical heart sounds with LVAD hum present.  Lungs:  CTAB, normal effort.  Abdomen:  NT, ND, no HSM. No bruits or masses. +BS  LVAD exit site: Well-healed and incorporated. Dressing dry and intact. No erythema or drainage. Stabilization device present and accurately applied. Driveline dressing changed daily per sterile technique. Extremities:  Warm and dry. No cyanosis, clubbing, rash, or edema.  Neuro:  Alert & oriented x 3. Cranial nerves grossly intact. Moves all 4 extremities w/o difficulty. Affect pleasant    Patient can go home today.  He continues to have driveline fault alarms despite repair.  No further repairs to perform at this point.  He will use ungrounded cable or batteries at home.    He will continue his same pre-admission medication regimen.   Ezra Shuck 09/08/2024 9:56 AM  "

## 2024-09-08 NOTE — Discharge Summary (Addendum)
 Advanced Heart Failure Team  Discharge Summary   Patient ID: Johnny Oneill MRN: [id number], DOB/AGE: 1941-01-13 83 y.o. Admit date: 09/06/2024 D/C date:     09/08/2024   Primary Discharge Diagnoses:  LVAD w DL Fault Alarms  Secondary Discharge Diagnoses:  Chronic HFrEF HTN Orthostatic hypotension VT/PVCs PAF CKD IV  Hospital Course:   Johnny Oneill is a 84 y.o. 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   He was admitted for planned driveline repair given multiple DL fault alarms He underwent external repairs by [employer] 09/08/23. Unfortunately this did not fix the default alarms. He will continue using ungrounded cable, has equipment for this already.   Discharge Vitals: Blood pressure (!) 116/101, pulse 80, temperature 97.9 F (36.6 C), temperature source Oral, resp. rate 12, height 5' 7 (1.702 m), weight 72.6 kg, SpO2 97%.  Labs: Lab Results  Component Value Date   WBC 6.5 09/08/2024   HGB 13.5 09/08/2024   HCT 41.7 09/08/2024   MCV 92.3 09/08/2024   PLT 139 (L) 09/08/2024    Recent Labs  Lab 09/06/24 2011 09/07/24 0917 09/08/24 0853  NA 136   < > 138  K 4.2   < > 4.0  CL 104   < > 105  CO2 20*   < > 22  BUN 36*   < > 32*  CREATININE 2.11*   < > 1.91*  CALCIUM  9.0   < > 9.4  PROT 6.5  --   --   BILITOT 0.4  --   --   ALKPHOS 106  --   --   ALT 15  --   --   AST 28  --   --   GLUCOSE 113*   < > 102*   < > = values in this interval not displayed.   Lab Results  Component Value Date   CHOL 93 05/28/2015   HDL 41.50 05/28/2015   LDLCALC 29 05/28/2015   TRIG 112.0 05/28/2015   BNP (last 3 results) No results for input(s): BNP in the last 8760 hours.  ProBNP (last 3 results) No results for input(s): PROBNP in the last 8760 hours.   Diagnostic Studies/Procedures   DG CHEST PORT 1 VIEW Result Date: 09/06/2024 CLINICAL DATA:  Chronic systolic heart failure  EXAM: PORTABLE CHEST 1 VIEW COMPARISON:  August 02, 2023 FINDINGS: Stable cardiomegaly. Left ventricular cyst device is again noted and unchanged. Left-sided defibrillator is unchanged. Sternotomy wires are noted. Lungs are clear. Bony thorax is unremarkable. IMPRESSION: No active disease. Electronically Signed   By: Lynwood Landy Raddle M.D.   On: 09/06/2024 16:22    Discharge Medications   Allergies as of 09/08/2024       Reactions   Ace Inhibitors Other (See Comments)   Hypotension and elevated creatinine   Amiodarone  Other (See Comments)   Cerebellar side effects   Diovan [valsartan] Other (See Comments)   Hypotension at low dose, hyperkalemia   Lipitor [atorvastatin  Calcium ] Other (See Comments)   Muscle pain   Norvasc  [amlodipine ] Other (See Comments)   hypotension   Jardiance  [empagliflozin ] Nausea And Vomiting        Medication List     TAKE these medications    acetaminophen  500 MG tablet Commonly known as: TYLENOL  Take 1,000 mg by mouth every 6 (six) hours as needed (for pain or headaches).   allopurinol  100 MG tablet Commonly  known as: ZYLOPRIM  Take 50 mg by mouth daily. What changed: how much to take   aspirin  EC 81 MG tablet Take 81 mg by mouth in the morning.   citalopram  10 MG tablet Commonly known as: CELEXA  Take 1 tablet by mouth once daily   CoQ10 200 MG Caps Take 200 mg by mouth in the morning.   famotidine  10 MG tablet Commonly known as: PEPCID  Take 10 mg by mouth daily.   levothyroxine  175 MCG tablet Commonly known as: SYNTHROID  Take 0.5-1 tablets (87.5-175 mcg total) by mouth See admin instructions. Take 1 tablet daily every morning except half tablet on Sundays   mexiletine 150 MG capsule Commonly known as: MEXITIL  Take 1 capsule by mouth twice daily   midodrine  5 MG tablet Commonly known as: PROAMATINE  TAKE 1 TABLET BY MOUTH TWICE DAILY AS NEEDED TAKE  1  TABLET  (5  MG)  IF  DOPPLER  IS  90  OR  LESS What changed: See the new  instructions.   pantoprazole  40 MG tablet Commonly known as: PROTONIX  Take 1 tablet by mouth once daily   PROBIOTIC PO Take 1 capsule by mouth in the morning.   rosuvastatin  20 MG tablet Commonly known as: CRESTOR  Take 1 tablet by mouth once daily   sotalol  80 MG tablet Commonly known as: BETAPACE  Take 1 tablet by mouth once daily   VITAMIN D3 PO Take 1 tablet by mouth daily with breakfast.   warfarin 5 MG tablet Commonly known as: COUMADIN  Take as directed. If you are unsure how to take this medication, talk to your nurse or doctor. Original instructions: Take 2.5 mg (1/2 tablet) every Mon, Fri; 5 mg (whole tablet) all other days; or as directed by Heart Failure Clinic        Disposition   The patient will be discharged in stable condition to home. Discharge Instructions     (HEART FAILURE PATIENTS) Call MD:  Anytime you have any of the following symptoms: 1) 3 pound weight gain in 24 hours or 5 pounds in 1 week 2) shortness of breath, with or without a dry hacking cough 3) swelling in the hands, feet or stomach 4) if you have to sleep on extra pillows at night in order to breathe.   Complete by: As directed    Diet - low sodium heart healthy   Complete by: As directed    Increase activity slowly   Complete by: As directed    STOP any activity that causes chest pain, shortness of breath, dizziness, sweating, or exessive weakness   Complete by: As directed           Duration of Discharge Encounter: 8 minutes   Jordan Lee, NP 09/08/2024, 11:48 AM  Patient seen with NP, I formulated the plan and agree with the above note.   Please see my separate note from today.   MD discharge time 31 minutes.   Ezra Shuck 09/08/2024 12:46 PM

## 2024-09-10 LAB — TYPE AND SCREEN
ABO/RH(D): O NEG
Antibody Screen: POSITIVE
Unit division: 0
Unit division: 0

## 2024-09-10 LAB — BPAM RBC
Blood Product Expiration Date: 202602012359
Blood Product Expiration Date: 202602012359
Unit Type and Rh: 9500
Unit Type and Rh: 9500

## 2024-09-11 ENCOUNTER — Ambulatory Visit: Admitting: Internal Medicine

## 2024-09-15 ENCOUNTER — Other Ambulatory Visit (HOSPITAL_COMMUNITY): Payer: Self-pay

## 2024-09-15 DIAGNOSIS — Z7901 Long term (current) use of anticoagulants: Secondary | ICD-10-CM

## 2024-09-15 DIAGNOSIS — Z95811 Presence of heart assist device: Secondary | ICD-10-CM

## 2024-09-17 ENCOUNTER — Other Ambulatory Visit (HOSPITAL_COMMUNITY): Payer: Self-pay | Admitting: Internal Medicine

## 2024-09-17 DIAGNOSIS — I5022 Chronic systolic (congestive) heart failure: Secondary | ICD-10-CM

## 2024-09-17 DIAGNOSIS — Z95811 Presence of heart assist device: Secondary | ICD-10-CM

## 2024-09-17 DIAGNOSIS — I472 Ventricular tachycardia, unspecified: Secondary | ICD-10-CM

## 2024-09-20 ENCOUNTER — Ambulatory Visit (HOSPITAL_COMMUNITY)
Admission: RE | Admit: 2024-09-20 | Discharge: 2024-09-20 | Disposition: A | Discharge status: Home / Self Care | Source: Ambulatory Visit | Attending: Cardiology | Admitting: Cardiology

## 2024-09-20 ENCOUNTER — Ambulatory Visit (HOSPITAL_COMMUNITY): Payer: Self-pay | Admitting: Pharmacist

## 2024-09-20 DIAGNOSIS — Z95811 Presence of heart assist device: Secondary | ICD-10-CM | POA: Diagnosis not present

## 2024-09-20 DIAGNOSIS — Z5181 Encounter for therapeutic drug level monitoring: Secondary | ICD-10-CM | POA: Insufficient documentation

## 2024-09-20 DIAGNOSIS — Z7901 Long term (current) use of anticoagulants: Secondary | ICD-10-CM | POA: Insufficient documentation

## 2024-09-20 LAB — PROTIME-INR
INR: 2.7 — ABNORMAL HIGH (ref 0.8–1.2)
Prothrombin Time: 30.2 s — ABNORMAL HIGH (ref 11.4–15.2)

## 2024-09-21 ENCOUNTER — Ambulatory Visit

## 2024-09-21 ENCOUNTER — Ambulatory Visit: Payer: Self-pay | Admitting: Cardiology

## 2024-09-21 ENCOUNTER — Ambulatory Visit (HOSPITAL_COMMUNITY)
Admission: RE | Admit: 2024-09-21 | Discharge: 2024-09-21 | Disposition: A | Discharge status: Home / Self Care | Source: Ambulatory Visit | Attending: Cardiology | Admitting: Cardiology

## 2024-09-21 ENCOUNTER — Ambulatory Visit (HOSPITAL_COMMUNITY)

## 2024-09-21 ENCOUNTER — Telehealth (HOSPITAL_COMMUNITY): Payer: Self-pay | Admitting: Unknown Physician Specialty

## 2024-09-21 DIAGNOSIS — I255 Ischemic cardiomyopathy: Secondary | ICD-10-CM

## 2024-09-21 DIAGNOSIS — Z95811 Presence of heart assist device: Secondary | ICD-10-CM | POA: Insufficient documentation

## 2024-09-21 DIAGNOSIS — Z4801 Encounter for change or removal of surgical wound dressing: Secondary | ICD-10-CM | POA: Insufficient documentation

## 2024-09-21 LAB — CUP PACEART REMOTE DEVICE CHECK
Battery Remaining Longevity: 21 mo
Battery Voltage: 2.9 V
Brady Statistic AP VP Percent: 96.67 %
Brady Statistic AP VS Percent: 0.45 %
Brady Statistic AS VP Percent: 2.85 %
Brady Statistic AS VS Percent: 0.03 %
Brady Statistic RA Percent Paced: 97.02 %
Brady Statistic RV Percent Paced: 99.44 %
Date Time Interrogation Session: 20260122012402
HighPow Impedance: 42 Ohm
HighPow Impedance: 54 Ohm
Implantable Lead Connection Status: 753985
Implantable Lead Connection Status: 753985
Implantable Lead Implant Date: 20091103
Implantable Lead Implant Date: 20091103
Implantable Lead Location: 753859
Implantable Lead Location: 753860
Implantable Lead Model: 5076
Implantable Lead Model: 6947
Implantable Pulse Generator Implant Date: 20201028
Lead Channel Impedance Value: 342 Ohm
Lead Channel Impedance Value: 399 Ohm
Lead Channel Impedance Value: 399 Ohm
Lead Channel Pacing Threshold Amplitude: 0.5 V
Lead Channel Pacing Threshold Amplitude: 1.125 V
Lead Channel Pacing Threshold Pulse Width: 0.4 ms
Lead Channel Pacing Threshold Pulse Width: 0.4 ms
Lead Channel Sensing Intrinsic Amplitude: 2.375 mV
Lead Channel Sensing Intrinsic Amplitude: 2.375 mV
Lead Channel Sensing Intrinsic Amplitude: 6.25 mV
Lead Channel Sensing Intrinsic Amplitude: 6.25 mV
Lead Channel Setting Pacing Amplitude: 1.5 V
Lead Channel Setting Pacing Amplitude: 2.75 V
Lead Channel Setting Pacing Pulse Width: 0.6 ms
Lead Channel Setting Sensing Sensitivity: 0.3 mV

## 2024-09-21 NOTE — Telephone Encounter (Signed)

## 2024-09-21 NOTE — Progress Notes (Signed)
 Patient presents to clinic today for drive line exit wound care. Pts wife called stating that she removed a scab from [name] driveline and it was bleeding last night. It appears bleeding has stopped.  Existing VAD dressing removed and site care performed using sterile technique. Drive line exit site cleaned with alcohol prep pad x 2, allowed to dry, and Sorbaview dressing with Silverlon patch applied. Exit site healed and incorporated, the velour is fully implanted at exit site. Old Silverlon with dried blood. Driveline is no longer bleeding. No redness, tenderness, foul odor or rash noted. Drive line anchor re-applied.   Lauraine Ip RN, BSN VAD Coordinator 24/7 Pager [phone]

## 2024-09-22 ENCOUNTER — Other Ambulatory Visit (HOSPITAL_COMMUNITY): Payer: Self-pay | Admitting: Internal Medicine

## 2024-09-22 DIAGNOSIS — I255 Ischemic cardiomyopathy: Secondary | ICD-10-CM

## 2024-09-23 NOTE — Progress Notes (Signed)
 Remote ICD Transmission

## 2024-09-29 ENCOUNTER — Other Ambulatory Visit (HOSPITAL_COMMUNITY): Payer: Self-pay | Admitting: *Deleted

## 2024-09-29 ENCOUNTER — Ambulatory Visit (HOSPITAL_COMMUNITY)
Admission: RE | Admit: 2024-09-29 | Discharge: 2024-09-29 | Disposition: A | Source: Ambulatory Visit | Attending: Internal Medicine

## 2024-09-29 ENCOUNTER — Ambulatory Visit (HOSPITAL_COMMUNITY): Payer: Self-pay | Admitting: Pharmacist

## 2024-09-29 DIAGNOSIS — Z95811 Presence of heart assist device: Secondary | ICD-10-CM | POA: Insufficient documentation

## 2024-09-29 DIAGNOSIS — I5022 Chronic systolic (congestive) heart failure: Secondary | ICD-10-CM

## 2024-09-29 DIAGNOSIS — Z4801 Encounter for change or removal of surgical wound dressing: Secondary | ICD-10-CM | POA: Insufficient documentation

## 2024-09-29 DIAGNOSIS — Z7901 Long term (current) use of anticoagulants: Secondary | ICD-10-CM

## 2024-09-29 LAB — PROTIME-INR
INR: 3.7 — ABNORMAL HIGH (ref 0.8–1.2)
Prothrombin Time: 38.6 s — ABNORMAL HIGH (ref 11.4–15.2)

## 2024-09-29 LAB — CBC
HCT: 30.8 % — ABNORMAL LOW (ref 39.0–52.0)
Hemoglobin: 10 g/dL — ABNORMAL LOW (ref 13.0–17.0)
MCH: 30 pg (ref 26.0–34.0)
MCHC: 32.5 g/dL (ref 30.0–36.0)
MCV: 92.5 fL (ref 80.0–100.0)
Platelets: 177 10*3/uL (ref 150–400)
RBC: 3.33 MIL/uL — ABNORMAL LOW (ref 4.22–5.81)
RDW: 16.3 % — ABNORMAL HIGH (ref 11.5–15.5)
WBC: 7.2 10*3/uL (ref 4.0–10.5)
nRBC: 0 % (ref 0.0–0.2)

## 2024-09-29 NOTE — Progress Notes (Signed)
 Patient presents to clinic today for drive line exit wound care. Pts wife called stating that she fell and broke some ribs and is unable to do his dressing.  Pt tells me that he also fell on his left knee. Dr Bensimhon into assess his leg/knee. Pt has hematoma. Knee and thigh are swollen. Pt instructed to rest, ice and he can wrap with an Ace if needed.  Existing VAD dressing removed and site care performed using sterile technique. Drive line exit site cleaned with alcohol prep pad x 2, allowed to dry, and Sorbaview dressing with Silverlon patch applied. Exit site healed and incorporated, the velour is fully implanted at exit site. No redness, tenderness, foul odor or rash noted. Drive line anchor re-applied.   Lauraine Ip RN, BSN VAD Coordinator 24/7 Pager [phone]

## 2024-10-03 ENCOUNTER — Emergency Department (HOSPITAL_COMMUNITY)
Admission: EM | Admit: 2024-10-03 | Discharge: 2024-10-03 | Disposition: A | Discharge status: Home / Self Care | Source: Home / Self Care | Attending: Emergency Medicine | Admitting: Emergency Medicine

## 2024-10-03 ENCOUNTER — Ambulatory Visit (HOSPITAL_COMMUNITY)
Admission: RE | Admit: 2024-10-03 | Discharge: 2024-10-03 | Disposition: A | Source: Ambulatory Visit | Attending: Cardiology | Admitting: Cardiology

## 2024-10-03 ENCOUNTER — Other Ambulatory Visit: Payer: Self-pay | Admitting: Unknown Physician Specialty

## 2024-10-03 ENCOUNTER — Other Ambulatory Visit (HOSPITAL_COMMUNITY): Payer: Self-pay | Admitting: Unknown Physician Specialty

## 2024-10-03 ENCOUNTER — Ambulatory Visit (HOSPITAL_COMMUNITY): Payer: Self-pay | Admitting: Pharmacist

## 2024-10-03 ENCOUNTER — Emergency Department (HOSPITAL_COMMUNITY)

## 2024-10-03 DIAGNOSIS — M1A9XX0 Chronic gout, unspecified, without tophus (tophi): Secondary | ICD-10-CM | POA: Insufficient documentation

## 2024-10-03 DIAGNOSIS — Z7901 Long term (current) use of anticoagulants: Secondary | ICD-10-CM | POA: Diagnosis not present

## 2024-10-03 DIAGNOSIS — I130 Hypertensive heart and chronic kidney disease with heart failure and stage 1 through stage 4 chronic kidney disease, or unspecified chronic kidney disease: Secondary | ICD-10-CM | POA: Insufficient documentation

## 2024-10-03 DIAGNOSIS — I429 Cardiomyopathy, unspecified: Secondary | ICD-10-CM | POA: Insufficient documentation

## 2024-10-03 DIAGNOSIS — Z7982 Long term (current) use of aspirin: Secondary | ICD-10-CM | POA: Insufficient documentation

## 2024-10-03 DIAGNOSIS — R7989 Other specified abnormal findings of blood chemistry: Secondary | ICD-10-CM | POA: Insufficient documentation

## 2024-10-03 DIAGNOSIS — Z79899 Other long term (current) drug therapy: Secondary | ICD-10-CM | POA: Insufficient documentation

## 2024-10-03 DIAGNOSIS — I4892 Unspecified atrial flutter: Secondary | ICD-10-CM | POA: Insufficient documentation

## 2024-10-03 DIAGNOSIS — D649 Anemia, unspecified: Secondary | ICD-10-CM

## 2024-10-03 DIAGNOSIS — Z95811 Presence of heart assist device: Secondary | ICD-10-CM | POA: Insufficient documentation

## 2024-10-03 DIAGNOSIS — D631 Anemia in chronic kidney disease: Secondary | ICD-10-CM | POA: Insufficient documentation

## 2024-10-03 DIAGNOSIS — Z95810 Presence of automatic (implantable) cardiac defibrillator: Secondary | ICD-10-CM | POA: Insufficient documentation

## 2024-10-03 DIAGNOSIS — I2510 Atherosclerotic heart disease of native coronary artery without angina pectoris: Secondary | ICD-10-CM | POA: Insufficient documentation

## 2024-10-03 DIAGNOSIS — N184 Chronic kidney disease, stage 4 (severe): Secondary | ICD-10-CM | POA: Insufficient documentation

## 2024-10-03 DIAGNOSIS — I5022 Chronic systolic (congestive) heart failure: Secondary | ICD-10-CM | POA: Insufficient documentation

## 2024-10-03 DIAGNOSIS — I493 Ventricular premature depolarization: Secondary | ICD-10-CM | POA: Insufficient documentation

## 2024-10-03 LAB — BASIC METABOLIC PANEL WITH GFR
Anion gap: 14 (ref 5–15)
Anion gap: 11 (ref 5–15)
BUN: 37 mg/dL — ABNORMAL HIGH (ref 8–23)
BUN: 36 mg/dL — ABNORMAL HIGH (ref 8–23)
CO2: 21 mmol/L — ABNORMAL LOW (ref 22–32)
CO2: 23 mmol/L (ref 22–32)
Calcium: 8.9 mg/dL (ref 8.9–10.3)
Calcium: 9.3 mg/dL (ref 8.9–10.3)
Chloride: 103 mmol/L (ref 98–111)
Chloride: 102 mmol/L (ref 98–111)
Creatinine, Ser: 2.67 mg/dL — ABNORMAL HIGH (ref 0.61–1.24)
Creatinine, Ser: 2.45 mg/dL — ABNORMAL HIGH (ref 0.61–1.24)
GFR, Estimated: 23 mL/min — ABNORMAL LOW
GFR, Estimated: 26 mL/min — ABNORMAL LOW
Glucose, Bld: 184 mg/dL — ABNORMAL HIGH (ref 70–99)
Glucose, Bld: 97 mg/dL (ref 70–99)
Potassium: 4.4 mmol/L (ref 3.5–5.1)
Potassium: 4.4 mmol/L (ref 3.5–5.1)
Sodium: 138 mmol/L (ref 135–145)
Sodium: 136 mmol/L (ref 135–145)

## 2024-10-03 LAB — HEPATIC FUNCTION PANEL
ALT: 15 U/L (ref 0–44)
AST: 38 U/L (ref 15–41)
Albumin: 4.1 g/dL (ref 3.5–5.0)
Alkaline Phosphatase: 92 U/L (ref 38–126)
Bilirubin, Direct: 0.3 mg/dL — ABNORMAL HIGH (ref 0.0–0.2)
Indirect Bilirubin: 0.6 mg/dL (ref 0.3–0.9)
Total Bilirubin: 0.9 mg/dL (ref 0.0–1.2)
Total Protein: 7.1 g/dL (ref 6.5–8.1)

## 2024-10-03 LAB — CBC
HCT: 25.9 % — ABNORMAL LOW (ref 39.0–52.0)
Hemoglobin: 8.3 g/dL — ABNORMAL LOW (ref 13.0–17.0)
MCH: 30 pg (ref 26.0–34.0)
MCHC: 32 g/dL (ref 30.0–36.0)
MCV: 93.5 fL (ref 80.0–100.0)
Platelets: 236 10*3/uL (ref 150–400)
RBC: 2.77 MIL/uL — ABNORMAL LOW (ref 4.22–5.81)
RDW: 16.6 % — ABNORMAL HIGH (ref 11.5–15.5)
WBC: 7.7 10*3/uL (ref 4.0–10.5)
nRBC: 0 % (ref 0.0–0.2)

## 2024-10-03 LAB — CBC WITH DIFFERENTIAL/PLATELET
Abs Immature Granulocytes: 0.04 10*3/uL (ref 0.00–0.07)
Basophils Absolute: 0 10*3/uL (ref 0.0–0.1)
Basophils Relative: 0 %
Eosinophils Absolute: 0.1 10*3/uL (ref 0.0–0.5)
Eosinophils Relative: 2 %
HCT: 24.6 % — ABNORMAL LOW (ref 39.0–52.0)
Hemoglobin: 7.8 g/dL — ABNORMAL LOW (ref 13.0–17.0)
Immature Granulocytes: 1 %
Lymphocytes Relative: 11 %
Lymphs Abs: 0.9 10*3/uL (ref 0.7–4.0)
MCH: 29.9 pg (ref 26.0–34.0)
MCHC: 31.7 g/dL (ref 30.0–36.0)
MCV: 94.3 fL (ref 80.0–100.0)
Monocytes Absolute: 0.6 10*3/uL (ref 0.1–1.0)
Monocytes Relative: 8 %
Neutro Abs: 5.9 10*3/uL (ref 1.7–7.7)
Neutrophils Relative %: 78 %
Platelets: 204 10*3/uL (ref 150–400)
RBC: 2.61 MIL/uL — ABNORMAL LOW (ref 4.22–5.81)
RDW: 16.7 % — ABNORMAL HIGH (ref 11.5–15.5)
WBC: 7.6 10*3/uL (ref 4.0–10.5)
nRBC: 0 % (ref 0.0–0.2)

## 2024-10-03 LAB — PROTIME-INR
INR: 4.3 (ref 0.8–1.2)
INR: 4.1 (ref 0.8–1.2)
Prothrombin Time: 43.3 s — ABNORMAL HIGH (ref 11.4–15.2)
Prothrombin Time: 41.7 s — ABNORMAL HIGH (ref 11.4–15.2)

## 2024-10-03 LAB — PREPARE RBC (CROSSMATCH)

## 2024-10-03 LAB — LACTATE DEHYDROGENASE: LDH: 337 U/L — ABNORMAL HIGH (ref 105–235)

## 2024-10-03 MED ORDER — SODIUM CHLORIDE 0.9% IV SOLUTION: Freq: Once | INTRAVENOUS | Status: AC

## 2024-10-03 NOTE — Progress Notes (Signed)
 "  VAD CLINIC NOTE    HPI:  Johnny Oneill is a 84 y.o. 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   Presented to ER on 06/27/19 with syncopal episode and head injury from fall. ECG showed VT @ 240. Underwent emergent DC-CV in ER. Head CT no bleed. Admitted and had ICD gen replacement (EOL). -> Did not tolerate amio due to tremors.  Had ICD shock for VT on 06/09/20. Started on sotalol   On 02/06/21 Paced out of AFL.  Seen in ER 05/2021 due to LOW FLOW alarms. Found to be in VT rate 170. DCCV x 1 - returned to SR with frequent PVCs. Pacer settings adjusted by Jodie Passey PA- DDD 90.  RHC 11/22 to assess for RV failure RA = 5 RV = 33/5 PA = 33/13 (20) PCW = 10 Fick cardiac output/index = 4.0/2.1 Thermo CO/CI = 4.9/2.6 PVR = 2.0 WU Ao sat = 95% PA sat = 54%, 59% PAPI = 4.0  Admitted 12/01/21 for R TKA. Did well with surgery but complicated by severe orthostatic hypotension. Midodrine  added    Admitted 12/28/21 with recurrent VT. Had DC-CV in ER. Sotalol  restarted. Continued to have elevated MAPs as well as orthostasis. Midodrine  decreased and florinef  added.   Admitted 02/09/22 with  a/c RV failure w/ cardiogenic shock. LFTs, INR, creatinine elevated on admit. Started on empiric milrinone  with gradual improvement. LFTs/INR/creatinine improved. Had RHC primary left sided failure. LVAD speed optimized with speed increased to 9800.  Milrinone  stopped without difficulty. INR followed closely by pharmacy with adjustments to coumadin .   Patient admitted 08/2024 for planned driveline repair given multiple DL fault alarms. Underwent external repair by [employer] on 1/8, did not fix the default alarms, using ungrounded cable.     Patient was seen in clinic last week after a mechanical fall, noted to have 8/10 left upper thigh.  Hemoglobin at that time had dropped from a baseline of around 13 to 10.  He reported  feeling more fatigued over the weekend to return to clinic today.  His leg tightness has improved and he is able to bear weight, however is pale and reports increased fatigue.  Denies any melena, hematuria.     LVAD Documentation    08/07/2024  Device Info  LVAD Type: Heartmate II  Date of Implant: 09/18/2013  Therapy Type: Destination Therapy      08/07/2024  Vitals  Heart Rate: 86 BPM  Automatic BP: 121/106  Doppler MAP: 110 mmHg    Last 3 Weights Weight Weight  09/08/2024 72.576 kg 160 lb  09/07/2024 73 kg 160 lb 15 oz  09/06/2024 73.2 kg 161 lb 6 oz       10/04/2023  LVAD Paramaters  Speed: 9800 RPM  Flow: 5 LPM  PI: 7  Power: 6 Watts  Alarms: daily DL fault  Events: frequent  Last Speed Change Date: 02/18/2022  Last Ramp Echo Date: 01/03/2021  Last Right Heart Cath Date: 02/12/2022  Bleeding History: No  Type of Dressing: Weekly  Annual Maintenance Date: 09/27/2023    Labs    Units 10/03/24 1340 10/03/24 1118 09/29/24 1253 09/20/24 1147 09/08/24 0853 09/08/24 0431 09/07/24 0917  INR   --  4.3* 3.7* 2.7* 3.0*  --  3.1*  LDH U/L  --  337*  --   --  336*  --  343*  HGB g/dL 7.8* 8.3* 89.9*  --  13.5   < > 12.8*  CREATININE mg/dL  --  7.32*  --   --  8.08*  --  2.02*   < > = values in this interval not displayed.       Annual Equipment Maintenance on UBC/PM was performed 09/27/23.   I reviewed the LVAD parameters from today and compared the results to the patient's prior recorded data. LVAD interrogation was NEGATIVE for significant power changes, NEGATIVE for clinical alarms and POSITIVE for PI events/speed drops. No programming changes were made and pump is functioning within specified parameters. Pt is performing daily controller and system monitor self tests along with completing weekly and monthly maintenance for LVAD equipment.   Past Medical History:  Diagnosis Date   Anxiety    Automatic implantable cardioverter-defibrillator in situ    CAD (coronary artery  disease)    S/P stenting of LCX in 2004;  s/p Lat MI 2009 with occlusion of the LCX - treated with Promus stenting. Has total occlusion of the RCA.    CHF (congestive heart failure) (HCC)    Hypercholesteremia    Hypertension    Hypothyroidism    ICD (implantable cardiac defibrillator) in place    PROPHYLACTIC      medtronic   Inferior MI [facility]) ? date; 2009   Ischemic cardiomyopathy    a. 10/09 Echo: Sev LV Dysfxn, inf/lat AK, Mod MR.   Liver disease    LVAD (left ventricular assist device) present (HCC)    Osteoarthritis    a. s/p R TKR   Pacemaker    PVC (premature ventricular contraction)    RBBB    Shortness of breath     No current facility-administered medications for this encounter.   Current Outpatient Medications  Medication Sig Dispense Refill   acetaminophen  (TYLENOL ) 500 MG tablet Take 1,000 mg by mouth every 6 (six) hours as needed (for pain or headaches).     allopurinol  (ZYLOPRIM ) 100 MG tablet Take 50 mg by mouth daily. (Patient taking differently: Take 100 mg by mouth daily.)     aspirin  EC 81 MG tablet Take 81 mg by mouth in the morning.     Cholecalciferol (VITAMIN D3 PO) Take 1 tablet by mouth daily with breakfast.     citalopram  (CELEXA ) 10 MG tablet Take 1 tablet by mouth once daily 90 tablet 0   Coenzyme Q10 (COQ10) 200 MG CAPS Take 200 mg by mouth in the morning.     famotidine  (PEPCID ) 10 MG tablet Take 10 mg by mouth daily.     levothyroxine  (SYNTHROID ) 175 MCG tablet Take 0.5-1 tablets (87.5-175 mcg total) by mouth See admin instructions. Take 1 tablet daily every morning except half tablet on Sundays 90 tablet 2   mexiletine (MEXITIL ) 150 MG capsule Take 1 capsule by mouth twice daily 60 capsule 11   midodrine  (PROAMATINE ) 5 MG tablet Take 1 tablet (5 mg total) by mouth 2 (two) times daily with a meal.     pantoprazole  (PROTONIX ) 40 MG tablet Take 1 tablet by mouth once daily 90 tablet 3   Probiotic Product (PROBIOTIC PO) Take 1 capsule by mouth in  the morning.     rosuvastatin  (CRESTOR ) 20 MG tablet Take 1 tablet by mouth once daily 90 tablet 0   sotalol  (BETAPACE ) 80 MG tablet Take 1 tablet by mouth once daily 90 tablet 3   warfarin (COUMADIN ) 5 MG tablet Take 2.5 mg (1/2 tablet) every Mon, Fri; 5 mg (whole tablet) all other days; or  as directed by Heart Failure Clinic 90 tablet 3   Facility-Administered Medications Ordered in Other Encounters  Medication Dose Route Frequency Provider Last Rate Last Admin   0.9 %  sodium chloride  infusion (Manually program via Guardrails IV Fluids)   Intravenous Once Theotis Peers M, PA-C       etomidate  (AMIDATE ) injection    Anesthesia Intra-op Decarlo, Valerie M, CRNA   20 mg at 02/08/14 0915   rocuronium  (ZEMURON ) injection    Anesthesia Intra-op Decarlo, Valerie M, CRNA   50 mg at 02/08/14 0932   succinylcholine  (ANECTINE ) injection    Anesthesia Intra-op Decarlo, Valerie M, CRNA   100 mg at 02/08/14 0915   Ace inhibitors, Amiodarone , Diovan [valsartan], Lipitor [atorvastatin  calcium ], Norvasc  [amlodipine ], and Jardiance  [empagliflozin ]  Review of systems complete and found to be negative unless listed in HPI.     Wt Readings from Last 3 Encounters:  09/08/24 72.6 kg (160 lb)  08/07/24 75 kg (165 lb 6.4 oz)  06/12/24 76.3 kg (168 lb 3.2 oz)    Physical Exam: General:  Pale appearing Cor: Mechanical heart sounds with LVAD hum present. JVP flat, No edema Lungs: Normal WOB Abdomen: soft, nontender, nondistended.  Driveline: C/D/I; securement device intact and driveline incorporated Neuro: alert & orientedx3, cranial nerves grossly intact. moves all 4 extremities w/o difficulty.  Extremities: Tense over his left upper thigh, portable ultrasound with large complex fluid collection consistent with hematoma     ASSESSMENT AND PLAN:   Fall with acute blood loss anemia: Patient with large hematoma noted on bedside ultrasound over site of previous fall, new acute blood loss anemia in the  setting of warfarin use and mechanical fall. Hgb drop further from 10 to 8.3, baseline around 13.  - Sent to the emergency department for blood transfusion, will likely need IV iron in the future - Knee and hip x-ray, though fracture unlikely given weightbearing - INR 4.3, will need to adjust dosing, discussed with pharmacy  1) Chronic systolic HF: ICM, EF 20-25% s/p LVAD HM II implant 08/2013 for DT.   - Echo 3/18 EF 10-15% mild AI. Mod RV dysfunction. LVAD cannula OK - has struggled with RV failure however RHC 11/22 looked good (despite evidence of RVF on LE dopplers showing reflux - Doing well NYHA I-II Orthostasis managed with daily midodrine  -> MAP stable today - Euvolemic, no adjustment to medications given acute blood loss anemia  -Failed Jardiance  due to volume depletion - He has not tolerated ACE-I or amlodipine  or b-blockers   2) LVAD: HMII 08/2013 for DT. - VAD interrogated personally. Parameters stable.. - He continue to have DL fault alarms. Reviewed with [employer] engineers again today and these are felt to be getting worse, Recommend attempt at external repair. Will schedule for next available slot with engineers - Remains on ASA and warfarin (HM-2) - LDH 337 - INR 4.3, adjustments after discussion with pharmacy - DL site ok -Continue current midodrine  regimen  3) Driveline fault alerts -Repair attempt on 1/8, continuing to have faults -Ungrounded cable and batteries at home  4) HTN complicated by orthostatic hypotension - No change  5) VT/Frequent PVCs - s/p ICD gen change.  - Off amio due to cerebellar side effects.  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 - VT  06/16/21 with  DC-CV x 1 - Recurrent VT 12/28/21 after stopping sotalol . Sotalol  restarted. - PVCs improved on mexilitene.  Continue mexilitene - Keep K > 4.0 Mg > 2.0 (K 4.5 mg 2.2 today) - No recent  VT - Continue sotalol  and mexilitene. Continue to follow with EP. - Watch sotalol  dosing with CKD.  Serum creatinine mildly  bumped to 2.67, should improve with blood transfusion  6) Atrial flutter, paroxysmal - patient paced out of AFL in 6/22. Atrial therapies enabled  - continue sotalol  (on for VT). QTs slightly prolonged but have been stable - in NSR today - on warfarin  7) CAD - no s/s angina - continue Crestor   9) CKD IV - Creatinine baseline 1.8-2.4 - Scr increased to 2.67 - failed Jardiance  - Follows with Dr. Gearline (Renal)  10) Chronic gout - Following with Rheum - Improved on allopurinol   I spent 45 minutes caring for this patient today including face to face time, ordering and reviewing labs, reviewing records from previous office visit, discussing and arranging emergency room follow-up for blood transfusion and imaging, bedside ultrasound, seeing the patient, documenting in the record, and arranging follow ups.    Morene JINNY Brownie, MD  2:16 PM "

## 2024-10-03 NOTE — Discharge Instructions (Signed)
 Please follow-up with your VAD clinic as scheduled.

## 2024-10-03 NOTE — ED Provider Notes (Signed)
" °  Physical Exam  BP (!) 114/99 (BP Location: Right Arm)   Pulse 80   Temp 98.2 F (36.8 C) (Oral)   Resp 15   SpO2 100%   Physical Exam  Procedures  Procedures  ED Course / MDM   Clinical Course as of 10/03/24 1540  Tue Oct 03, 2024  1248 I spoke with Dr. Cherrie with the ECMO care team who evaluated the patient at the bedside.  He stated that he has been a little more weak and his hemoglobin in the office was 8.3 today.  They recommend giving him 1 unit of blood and plan to discharge home from their perspective. [CF]  1437 Protime-INR(!!) Prolonged. [CF]  1437 CBC with Differential(!) Hemoglobin of 7.8.  No other significant abnormalities. [CF]  1437 Basic metabolic panel(!) Creatinine somewhat at patient's baseline improved from most recent result. [CF]  1437 Hepatic function panel(!) Negative. [CF]  1438 DG Pelvis Portable No evidence of fracture.  I do agree with radiologist interpretation. [CF]  1438 DG Femur Portable Min 2 Views Left Negative for fracture.  I do agree with radiologist interpretation. [CF]    Clinical Course User Index [CF] Theotis Cameron HERO, PA-C   Medical Decision Making Amount and/or Complexity of Data Reviewed Labs: ordered. Decision-making details documented in ED Course. Radiology: ordered. Decision-making details documented in ED Course.  Risk Prescription drug management.  Care handed off by C. Theotis, PA-C  Johnny Oneill is a 84 y.o. male patient presenting to the ED on previous shift for eval of fall and abnml labs.  Fall on ice one week prior, evaluated by ECMO team and referred to ED secondary to anemia of 8.3, likely resulting from bruising from the fall.  Plan at time of care hand off was for patient to be re-evaluated at the conclusion of one unit PRBC for Hg of 7.8.    Once complete, plan is for d/c to f/u with ECMO/VAD team OP.  Reassessment patient, he is resting comfortably in bed, and has completed the unit of PRBCs.  Given  reassuring exam after receiving PRBCs, no signs of TACO or TRALI, no other signs of acute transfusion reaction, plan for discharge to follow-up as previously discussed with previous provider to his VAD team.  Thoroughly discussed this with patient who voices understanding and agreement has no further concerns at this time and thus will be discharged with outpatient follow-up.       Myriam Dorn BROCKS, [place] 10/03/24 2100  "

## 2024-10-03 NOTE — ED Triage Notes (Addendum)
 Patient seen in Cardiology appointment after having a fall recently. LVAD in place 10+years , on thinners. Cards team noted his hemoglobin dropping & suggested a hematoma to the right thigh. Team also suggested a blood transfusion due to symptoms like decreased appetite & paleness. A&O & Ambulatory at baseline

## 2024-10-04 LAB — TYPE AND SCREEN
ABO/RH(D): O NEG
Antibody Screen: POSITIVE
Unit division: 0
Unit division: 0

## 2024-10-04 LAB — BPAM RBC
Blood Product Expiration Date: 202602202359
Blood Product Expiration Date: 202602202359
ISSUE DATE / TIME: 202602031556
Unit Type and Rh: 9500
Unit Type and Rh: 9500

## 2024-10-05 ENCOUNTER — Emergency Department (HOSPITAL_COMMUNITY)

## 2024-10-05 ENCOUNTER — Other Ambulatory Visit (HOSPITAL_COMMUNITY): Payer: Self-pay | Admitting: *Deleted

## 2024-10-05 DIAGNOSIS — I5022 Chronic systolic (congestive) heart failure: Secondary | ICD-10-CM

## 2024-10-05 DIAGNOSIS — Z95811 Presence of heart assist device: Secondary | ICD-10-CM

## 2024-10-05 DIAGNOSIS — Z7901 Long term (current) use of anticoagulants: Secondary | ICD-10-CM

## 2024-10-06 ENCOUNTER — Ambulatory Visit (HOSPITAL_COMMUNITY): Admission: RE | Admit: 2024-10-06

## 2024-10-06 ENCOUNTER — Other Ambulatory Visit (HOSPITAL_COMMUNITY): Payer: Self-pay | Admitting: Cardiology

## 2024-10-06 ENCOUNTER — Ambulatory Visit (HOSPITAL_COMMUNITY): Payer: Self-pay | Admitting: Pharmacist

## 2024-10-06 ENCOUNTER — Telehealth (HOSPITAL_COMMUNITY): Payer: Self-pay | Admitting: Unknown Physician Specialty

## 2024-10-06 DIAGNOSIS — S8012XS Contusion of left lower leg, sequela: Secondary | ICD-10-CM

## 2024-10-06 DIAGNOSIS — Z95811 Presence of heart assist device: Secondary | ICD-10-CM

## 2024-10-06 DIAGNOSIS — I5022 Chronic systolic (congestive) heart failure: Secondary | ICD-10-CM

## 2024-10-06 DIAGNOSIS — Z7901 Long term (current) use of anticoagulants: Secondary | ICD-10-CM

## 2024-10-06 LAB — COMPREHENSIVE METABOLIC PANEL WITH GFR
ALT: 17 U/L (ref 0–44)
AST: 37 U/L (ref 15–41)
Albumin: 4.3 g/dL (ref 3.5–5.0)
Alkaline Phosphatase: 103 U/L (ref 38–126)
Anion gap: 11 (ref 5–15)
BUN: 30 mg/dL — ABNORMAL HIGH (ref 8–23)
CO2: 23 mmol/L (ref 22–32)
Calcium: 9.3 mg/dL (ref 8.9–10.3)
Chloride: 103 mmol/L (ref 98–111)
Creatinine, Ser: 2.47 mg/dL — ABNORMAL HIGH (ref 0.61–1.24)
GFR, Estimated: 26 mL/min — ABNORMAL LOW
Glucose, Bld: 95 mg/dL (ref 70–99)
Potassium: 4.5 mmol/L (ref 3.5–5.1)
Sodium: 137 mmol/L (ref 135–145)
Total Bilirubin: 1.7 mg/dL — ABNORMAL HIGH (ref 0.0–1.2)
Total Protein: 7.4 g/dL (ref 6.5–8.1)

## 2024-10-06 LAB — CBC
HCT: 29.4 % — ABNORMAL LOW (ref 39.0–52.0)
Hemoglobin: 9.4 g/dL — ABNORMAL LOW (ref 13.0–17.0)
MCH: 30.9 pg (ref 26.0–34.0)
MCHC: 32 g/dL (ref 30.0–36.0)
MCV: 96.7 fL (ref 80.0–100.0)
Platelets: 253 10*3/uL (ref 150–400)
RBC: 3.04 MIL/uL — ABNORMAL LOW (ref 4.22–5.81)
RDW: 17.6 % — ABNORMAL HIGH (ref 11.5–15.5)
WBC: 7.9 10*3/uL (ref 4.0–10.5)
nRBC: 0 % (ref 0.0–0.2)

## 2024-10-06 LAB — TSH: TSH: 1.15 u[IU]/mL (ref 0.350–4.500)

## 2024-10-06 LAB — PROTIME-INR
INR: 2.4 — ABNORMAL HIGH (ref 0.8–1.2)
Prothrombin Time: 27 s — ABNORMAL HIGH (ref 11.4–15.2)

## 2024-10-06 LAB — PREALBUMIN: Prealbumin: 17 mg/dL — ABNORMAL LOW (ref 18–38)

## 2024-10-06 LAB — T4, FREE: Free T4: 1.51 ng/dL (ref 0.80–2.00)

## 2024-10-06 LAB — LACTATE DEHYDROGENASE: LDH: 365 U/L — ABNORMAL HIGH (ref 105–235)

## 2024-10-06 LAB — MAGNESIUM: Magnesium: 1.9 mg/dL (ref 1.7–2.4)

## 2024-10-06 MED ORDER — TRAMADOL HCL 50 MG PO TABS: 50.0000 mg | ORAL_TABLET | Freq: Four times a day (QID) | ORAL | 0 refills | Status: DC | PRN

## 2024-10-06 NOTE — Telephone Encounter (Signed)
 Pt presents for labs today in clinic. He states that he is still having quite a bit of pain in his hip and knee. He is asking for something stronger for this pain. D/w DR Bensimhon, will have app send in script for Tramadol .  Labs much better today. Hgb rising. Ask pt to increase water intake. Pt has full visit on Monday.  Lauraine Ip RN, BSN VAD Coordinator 24/7 Pager [phone]

## 2024-10-09 ENCOUNTER — Ambulatory Visit (HOSPITAL_COMMUNITY): Admitting: Internal Medicine

## 2024-12-21 ENCOUNTER — Encounter

## 2025-03-22 ENCOUNTER — Encounter
